# Patient Record
Sex: Male | Born: 1970 | Race: Black or African American | Hispanic: No | Marital: Single | Smoking: Current every day smoker
Health system: Southern US, Community
[De-identification: ages and names within clinical notes are randomized; demographics above are authoritative.]

## PROBLEM LIST (undated history)

## (undated) DIAGNOSIS — I272 Pulmonary hypertension, unspecified: Secondary | ICD-10-CM

## (undated) DIAGNOSIS — E118 Type 2 diabetes mellitus with unspecified complications: Secondary | ICD-10-CM

## (undated) DIAGNOSIS — Z89512 Acquired absence of left leg below knee: Secondary | ICD-10-CM

## (undated) DIAGNOSIS — D649 Anemia, unspecified: Secondary | ICD-10-CM

## (undated) DIAGNOSIS — E119 Type 2 diabetes mellitus without complications: Secondary | ICD-10-CM

## (undated) DIAGNOSIS — E1140 Type 2 diabetes mellitus with diabetic neuropathy, unspecified: Secondary | ICD-10-CM

## (undated) DIAGNOSIS — I10 Essential (primary) hypertension: Secondary | ICD-10-CM

## (undated) DIAGNOSIS — M869 Osteomyelitis, unspecified: Secondary | ICD-10-CM

## (undated) DIAGNOSIS — I5042 Chronic combined systolic (congestive) and diastolic (congestive) heart failure: Secondary | ICD-10-CM

## (undated) DIAGNOSIS — F419 Anxiety disorder, unspecified: Secondary | ICD-10-CM

## (undated) DIAGNOSIS — J189 Pneumonia, unspecified organism: Secondary | ICD-10-CM

## (undated) HISTORY — DX: Chronic combined systolic (congestive) and diastolic (congestive) heart failure: I50.42

## (undated) HISTORY — DX: Pulmonary hypertension, unspecified: I27.20

## (undated) HISTORY — DX: Essential (primary) hypertension: I10

## (undated) HISTORY — DX: Type 2 diabetes mellitus with diabetic neuropathy, unspecified: E11.40

## (undated) HISTORY — PX: SCROTUM EXPLORATION: SHX2389

## (undated) HISTORY — PX: ABSCESS DRAINAGE: SHX1119

---

## 2004-01-03 ENCOUNTER — Emergency Department (HOSPITAL_COMMUNITY): Admission: EM | Admit: 2004-01-03 | Discharge: 2004-01-04 | Payer: Self-pay | Admitting: *Deleted

## 2014-06-19 ENCOUNTER — Inpatient Hospital Stay (HOSPITAL_COMMUNITY)
Admission: EM | Admit: 2014-06-19 | Discharge: 2014-06-21 | DRG: 728 | Disposition: A | Discharge status: Home / Self Care | Payer: Self-pay | Attending: Urology | Admitting: Urology

## 2014-06-19 ENCOUNTER — Emergency Department (INDEPENDENT_AMBULATORY_CARE_PROVIDER_SITE_OTHER)
Admission: EM | Admit: 2014-06-19 | Discharge: 2014-06-19 | Disposition: A | Discharge status: Nursing Home (Includes ICF) | Payer: Self-pay | Source: Home / Self Care | Attending: Emergency Medicine | Admitting: Emergency Medicine

## 2014-06-19 ENCOUNTER — Encounter (HOSPITAL_COMMUNITY): Payer: Self-pay | Admitting: *Deleted

## 2014-06-19 ENCOUNTER — Encounter (HOSPITAL_COMMUNITY): Payer: Self-pay | Admitting: Family Medicine

## 2014-06-19 ENCOUNTER — Emergency Department (HOSPITAL_COMMUNITY): Payer: Self-pay

## 2014-06-19 DIAGNOSIS — L0291 Cutaneous abscess, unspecified: Secondary | ICD-10-CM

## 2014-06-19 DIAGNOSIS — N5089 Other specified disorders of the male genital organs: Secondary | ICD-10-CM

## 2014-06-19 DIAGNOSIS — N508 Other specified disorders of male genital organs: Secondary | ICD-10-CM | POA: Diagnosis present

## 2014-06-19 DIAGNOSIS — F17210 Nicotine dependence, cigarettes, uncomplicated: Secondary | ICD-10-CM | POA: Diagnosis present

## 2014-06-19 DIAGNOSIS — Z794 Long term (current) use of insulin: Secondary | ICD-10-CM

## 2014-06-19 DIAGNOSIS — E119 Type 2 diabetes mellitus without complications: Secondary | ICD-10-CM | POA: Diagnosis present

## 2014-06-19 DIAGNOSIS — R000 Tachycardia, unspecified: Secondary | ICD-10-CM | POA: Diagnosis present

## 2014-06-19 DIAGNOSIS — N492 Inflammatory disorders of scrotum: Principal | ICD-10-CM | POA: Diagnosis present

## 2014-06-19 HISTORY — DX: Type 2 diabetes mellitus without complications: E11.9

## 2014-06-19 LAB — CBC WITH DIFFERENTIAL/PLATELET
BASOS ABS: 0 10*3/uL (ref 0.0–0.1)
Basophils Relative: 0 % (ref 0–1)
EOS ABS: 0 10*3/uL (ref 0.0–0.7)
Eosinophils Relative: 0 % (ref 0–5)
HEMATOCRIT: 43.1 % (ref 39.0–52.0)
Hemoglobin: 15.1 g/dL (ref 13.0–17.0)
LYMPHS ABS: 1.4 10*3/uL (ref 0.7–4.0)
Lymphocytes Relative: 8 % — ABNORMAL LOW (ref 12–46)
MCH: 29.7 pg (ref 26.0–34.0)
MCHC: 35 g/dL (ref 30.0–36.0)
MCV: 84.7 fL (ref 78.0–100.0)
MONO ABS: 1.4 10*3/uL — AB (ref 0.1–1.0)
Monocytes Relative: 8 % (ref 3–12)
NEUTROS ABS: 14.1 10*3/uL — AB (ref 1.7–7.7)
Neutrophils Relative %: 84 % — ABNORMAL HIGH (ref 43–77)
Platelets: 228 10*3/uL (ref 150–400)
RBC: 5.09 MIL/uL (ref 4.22–5.81)
RDW: 12.7 % (ref 11.5–15.5)
WBC: 16.8 10*3/uL — AB (ref 4.0–10.5)

## 2014-06-19 LAB — BASIC METABOLIC PANEL
ANION GAP: 16 — AB (ref 5–15)
BUN: 10 mg/dL (ref 6–23)
CHLORIDE: 91 meq/L — AB (ref 96–112)
CO2: 23 meq/L (ref 19–32)
CREATININE: 0.76 mg/dL (ref 0.50–1.35)
Calcium: 9.6 mg/dL (ref 8.4–10.5)
GFR calc Af Amer: >90 mL/min (ref >90)
Glucose, Bld: 210 mg/dL — ABNORMAL HIGH (ref 70–99)
Potassium: 4.1 mEq/L (ref 3.7–5.3)
SODIUM: 130 meq/L — AB (ref 137–147)

## 2014-06-19 LAB — CBG MONITORING, ED: Glucose-Capillary: 213 mg/dL — ABNORMAL HIGH (ref 70–99)

## 2014-06-19 MED ORDER — SODIUM CHLORIDE 0.9 % IV BOLUS (SEPSIS)
1000.0000 mL | Freq: Once | INTRAVENOUS | Status: AC
  Administered 2014-06-19: 1000 mL via INTRAVENOUS

## 2014-06-19 MED ORDER — OXYCODONE-ACETAMINOPHEN 5-325 MG PO TABS
1.0000 | ORAL_TABLET | Freq: Once | ORAL | Status: AC
  Administered 2014-06-19: 1 via ORAL
  Filled 2014-06-19: qty 1

## 2014-06-19 MED ORDER — LIDOCAINE-EPINEPHRINE 2 %-1:100000 IJ SOLN: 20.0000 mL | Freq: Once | INTRAMUSCULAR | Status: DC

## 2014-06-19 MED ORDER — OXYCODONE-ACETAMINOPHEN 5-325 MG PO TABS
2.0000 | ORAL_TABLET | Freq: Once | ORAL | Status: AC
  Administered 2014-06-19: 2 via ORAL
  Filled 2014-06-19: qty 2

## 2014-06-19 MED ORDER — CLINDAMYCIN PHOSPHATE 600 MG/50ML IV SOLN
600.0000 mg | Freq: Once | INTRAVENOUS | Status: AC
  Administered 2014-06-19: 600 mg via INTRAVENOUS
  Filled 2014-06-19: qty 50

## 2014-06-19 MED ORDER — HEPARIN SOD (PORK) LOCK FLUSH 100 UNIT/ML IV SOLN
500.0000 [IU] | Freq: Once | INTRAVENOUS | Status: DC
  Filled 2014-06-19: qty 5

## 2014-06-19 MED ORDER — HYDROMORPHONE HCL 1 MG/ML IJ SOLN
1.0000 mg | Freq: Once | INTRAMUSCULAR | Status: DC
  Administered 2014-06-20: 1 mg via INTRAVENOUS
  Filled 2014-06-19: qty 1

## 2014-06-19 NOTE — ED Notes (Addendum)
C/o boil R groin onset 4-5 days ago.  States it got longer 1-2 days ago.  C/o pain.  No drainage.  [place] [facility] [facility] [place] and thinks he has had MRSA.  Just got out 2 weeks ago.  Vomited 3-4 times today and diarrhea x 1.

## 2014-06-19 NOTE — ED Notes (Signed)
Bed: [facility]2 Expected date:  Expected time:  Means of arrival:  Comments: 

## 2014-06-19 NOTE — ED Notes (Signed)
CBG = 213  RN informed of result.

## 2014-06-19 NOTE — Discharge Instructions (Signed)
We have determined that your problem requires further evaluation in the emergency department.  We will take care of your transport there.  Once at the emergency department, you will be evaluated by a provider and they will order whatever treatment or tests they deem necessary.  We cannot guarantee that they will do any specific test or do any specific treatment.  ° °

## 2014-06-19 NOTE — ED Notes (Signed)
2 attempts for blood draw with no success

## 2014-06-19 NOTE — ED Notes (Signed)
Pt here for abscess to groin area. Denies drainage.

## 2014-06-19 NOTE — ED Provider Notes (Signed)
CSN: [id number]     Arrival date & time 06/19/14  1724 History   First MD Initiated Contact with Patient 06/19/14 1811     Chief Complaint  Patient presents with  . Abscess     (Consider location/radiation/quality/duration/timing/severity/associated sxs/prior Treatment) HPI  Todd Mccoy is a 43 y.o. male with PMH of insulin dependent diabetes presenting with one-week history of abscess to his right groin. Patient states it has grown in size and erythema and tenderness. He has also had associated fevers MAXIMUM TEMPERATURE 100.9. It's 100.3 in ED. Patient also endorses some nausea and one episode of vomiting. Emesis was food contents, nonbloody. Patient denies abdominal pain patient with history of abscesses but never in this location. He denies any drainage. He states he takes NovoLog 7030 twice daily and his current dose ranges from 120s to 200s.   Past Medical History  Diagnosis Date  . Diabetes mellitus without complication    History reviewed. No pertinent past surgical history. Family History  Problem Relation Age of Onset  . Diabetes Mother   . Diabetes Father    History  Substance Use Topics  . Smoking status: Current Every Day Smoker -- 0.50 packs/day    Types: Cigarettes  . Smokeless tobacco: Not on file  . Alcohol Use: Not on file    Review of Systems  Constitutional: Positive for fever and chills.  Respiratory: Negative for cough and shortness of breath.   Cardiovascular: Negative for chest pain.  Gastrointestinal: Positive for nausea, vomiting and abdominal pain.  Genitourinary: Negative for frequency, discharge, penile swelling, scrotal swelling and testicular pain.  Skin: Negative for rash and wound.      Allergies  Review of patient's allergies indicates no known allergies.  Home Medications   Prior to Admission medications   Medication Sig Start Date End Date Taking? Authorizing Provider  insulin NPH-regular Human (NOVOLIN 70/30) (70-30) 100  UNIT/ML injection Inject 14-22 Units into the skin 2 (two) times daily with a meal. Inject 22 units with breakfast and 14 units with supper   Yes Historical Provider, MD   BP 140/75 mmHg  Pulse 102  Temp(Src) 100.3 F (37.9 C)  Resp 16  Wt 207 lb (93.895 kg)  SpO2 100% Physical Exam  Constitutional: He appears well-developed and well-nourished. No distress.  HENT:  Head: Normocephalic and atraumatic.  Eyes: Conjunctivae and EOM are normal. Right eye exhibits no discharge. Left eye exhibits no discharge.  Cardiovascular: Normal rate, regular rhythm and normal heart sounds.   Pulmonary/Chest: Effort normal and breath sounds normal. No respiratory distress. He has no wheezes.  Abdominal: Soft. Bowel sounds are normal. He exhibits no distension. There is no tenderness.  Genitourinary:  Patient with large abscess to right groin however there is no fluctuants noted. Abscess extends from inguinal triangle into right scrotum. It is exquisitely tender to palpation with induration. No noted crepitus, necrosis, ecchymoses, bullae, or blister.  Neurological: He is alert. He exhibits normal muscle tone. Coordination normal.  Skin: Skin is warm and dry. He is not diaphoretic.  Nursing note and vitals reviewed.   ED Course  Procedures (including critical care time) Labs Review Labs Reviewed  BASIC METABOLIC PANEL - Abnormal; Notable for the following:    Sodium 130 (*)    Chloride 91 (*)    Glucose, Bld 210 (*)    Anion gap 16 (*)    All other components within normal limits  CBC WITH DIFFERENTIAL - Abnormal; Notable for the following:    WBC  16.8 (*)    Neutrophils Relative % 84 (*)    Neutro Abs 14.1 (*)    Lymphocytes Relative 8 (*)    Monocytes Absolute 1.4 (*)    All other components within normal limits  CBG MONITORING, ED - Abnormal; Notable for the following:    Glucose-Capillary 213 (*)    All other components within normal limits    Imaging Review US Scrotum  06/19/2014    CLINICAL DATA:  Right scrotal swelling/pain, fever, leukocytosis  EXAM: SCROTAL ULTRASOUND  DOPPLER ULTRASOUND OF THE TESTICLES  TECHNIQUE: Complete ultrasound examination of the testicles, epididymis, and other scrotal structures was performed. Color and spectral Doppler ultrasound were also utilized to evaluate blood flow to the testicles.  COMPARISON:  None.  FINDINGS: Right testicle  Measurements: 4.0 x 1.9 x 2.5 cm. No mass or microlithiasis visualized.  Left testicle  Measurements: 3.8 x 2.2 x 2.3 cm. No mass or microlithiasis visualized.  Right epididymis:  Normal in size and appearance.  Left epididymis:  Normal in size and appearance.  Hydrocele:  None visualized.  Varicocele:  Present on the left.  Pulsed Doppler interrogation of both testes demonstrates low resistance arterial and venous waveforms bilaterally.  Additional comments: Irregular 3.8 x 1.7 x 2.3 cm complex fluid fluid collection in the posterior/lateral right scrotal wall, without significant hyperemia.  IMPRESSION: Normal sonographic appearance of the bilateral testes.  No evidence of testicular torsion.  3.8 x 1.7 x 2.3 cm complex fluid collection in the posterior/lateral right scrotal wall, without significant hyperemia, but worrisome for scrotal wall abscess.   Electronically Signed   By: Julian Hy M.D.   On: 06/19/2014 20:11   [place] Art/ven Flow Abd Pelv Doppler  06/19/2014   CLINICAL DATA:  Right scrotal swelling/pain, fever, leukocytosis  EXAM: SCROTAL ULTRASOUND  DOPPLER ULTRASOUND OF THE TESTICLES  TECHNIQUE: Complete ultrasound examination of the testicles, epididymis, and other scrotal structures was performed. Color and spectral Doppler ultrasound were also utilized to evaluate blood flow to the testicles.  COMPARISON:  None.  FINDINGS: Right testicle  Measurements: 4.0 x 1.9 x 2.5 cm. No mass or microlithiasis visualized.  Left testicle  Measurements: 3.8 x 2.2 x 2.3 cm. No mass or microlithiasis visualized.  Right  epididymis:  Normal in size and appearance.  Left epididymis:  Normal in size and appearance.  Hydrocele:  None visualized.  Varicocele:  Present on the left.  Pulsed Doppler interrogation of both testes demonstrates low resistance arterial and venous waveforms bilaterally.  Additional comments: Irregular 3.8 x 1.7 x 2.3 cm complex fluid fluid collection in the posterior/lateral right scrotal wall, without significant hyperemia.  IMPRESSION: Normal sonographic appearance of the bilateral testes.  No evidence of testicular torsion.  3.8 x 1.7 x 2.3 cm complex fluid collection in the posterior/lateral right scrotal wall, without significant hyperemia, but worrisome for scrotal wall abscess.   Electronically Signed   By: Julian Hy M.D.   On: 06/19/2014 20:11     EKG Interpretation None      MDM   Final diagnoses:  Testicular swelling, right  Scrotal abscess   Patient with history of insulin-dependent diabetes presenting with 5 days of increasing scrotal abscess. Pt with fevers, MAXIMUM TEMPERATURE 100.9, chills, tachycardia. CBG 213. White count 16.8. Patient given IV fluids as well as IV clindamycin. Ultrasound of scrotum without evidence of torsion. Evidence of scrotal wall abscess measuring 4 x 2 x 2 cm. Consult to urologist. Virl Axe currently at [facility]. Requesting patient be  transferred to [facility] for I&D in ED or possible surgical I&D tomorrow with consideration of admission for IV antibiotics. Patient with ride to [facility]. Patient has been discharged from [facility] and is expected at [facility].   Discussed all results and patient verbalizes understanding and agrees with plan.  This is a shared patient. This patient was discussed with the physician, Dr. Audie Pinto who saw and evaluated the patient and agrees with the plan.     Pura Spice, PA-C 06/19/14 2142  Dot Lanes, MD 06/19/14 [phone]

## 2014-06-19 NOTE — ED Provider Notes (Signed)
   Chief Complaint   Recurrent Skin Infections   History of Present Illness   Todd Mccoy is a 43 year old male diabetic has had a one-week history of an abscess in the right groin. He's had multiple abscesses before in various areas including his groin areas and is back area the abscess has not drained any pus but is very painful rated 9/10 in intensity. He's had a temperature today as high as 100.9, nausea, vomiting, and diarrhea.  Review of Systems   Other than as noted above, the patient denies any of the following symptoms: Systemic:  No fever, chills or sweats. Skin:  No rash or itching.  Todd Mccoy   Past medical history, family history, social history, meds, and allergies were reviewed. He's on Novolin 70/3022 units in the morning and 14 in the evening.  Physical Examination     Vital signs:  BP 134/88 mmHg  Pulse 119  Temp(Src) 99.8 F (37.7 C) (Oral)  Resp 16  SpO2 100% Gen.: Alert and oriented, but appears very uncomfortable secondary to pain. Lungs: Clear to auscultation. Heart: Regular rhythm, no gallop or murmur. Abdomen: Soft, nontender, no organomegaly or mass. Skin:  He has a huge abscess in his groin extending down down into his scrotum. This is very tender to palpation. It is indurated, and not fluctuant.  There was no crepitus, necrosis, ecchymosis, or herrhagic bullae. Skin exam was otherwise normal.  No rash.  Assessment   The encounter diagnosis was Abscess.  This is a huge abscess that begins in the inguinal area but extends on down into the scrotum and it appears to be deep, I think or require surgical drainage and IV antibiotics. Also, I'm concerned about his fever and tachycardia. Also the fact that he is a diabetic.  Plan     The patient was transferred to the ED via private vehicle in stable condition.  Medical Decision Making:  43 year old diabetic male has a one-week history of an abscess in his right groin. He's also had fever to 100.9,  nausea, vomiting, and diarrhea. I examination he has a huge abscess in his groin extending on down into the scrotum. I think this will require surgical drainage, and IV antibiotics.      Harden Mo, MD 06/19/14 [phone]

## 2014-06-19 NOTE — ED Provider Notes (Addendum)
CSN: [id number]     Arrival date & time 06/19/14  1724 History   First MD Initiated Contact with Patient 06/19/14 1811     Chief Complaint  Patient presents with  . Abscess     (Consider location/radiation/quality/duration/timing/severity/associated sxs/prior Treatment) Patient is a 43 y.o. male presenting with abscess. The history is provided by the patient.  Abscess Associated symptoms: fever   Associated symptoms: no headaches and no vomiting   pt with hx iddm, hx abscesses, ?mrsa, c/o right scrotal pain and swelling for the past week. Gradual onset, progressively worse. Pain mod/severe. Worse w palpation. +fever. No chills or sweats. No nvd. Denies polyuria or polydipsia. Denies abd pain. No dysuria. No penile discharge. Denies trauma to area. No drainage.       Past Medical History  Diagnosis Date  . Diabetes mellitus without complication    History reviewed. No pertinent past surgical history. Family History  Problem Relation Age of Onset  . Diabetes Mother   . Diabetes Father    History  Substance Use Topics  . Smoking status: Current Every Day Smoker -- 0.50 packs/day    Types: Cigarettes  . Smokeless tobacco: Not on file  . Alcohol Use: Not on file    Review of Systems  Constitutional: Positive for fever. Negative for chills and diaphoresis.  HENT: Negative for sore throat.   Eyes: Negative for redness.  Respiratory: Negative for shortness of breath.   Cardiovascular: Negative for chest pain.  Gastrointestinal: Negative for vomiting, abdominal pain and diarrhea.  Endocrine: Negative for polydipsia and polyuria.  Genitourinary: Negative for dysuria, hematuria, flank pain, discharge and genital sores.  Musculoskeletal: Negative for back pain and neck pain.  Skin: Negative for rash.  Neurological: Negative for headaches.  Hematological: Does not bruise/bleed easily.  Psychiatric/Behavioral: Negative for confusion.      Allergies  Review of patient's  allergies indicates no known allergies.  Home Medications   Prior to Admission medications   Medication Sig Start Date End Date Taking? Authorizing Provider  insulin NPH-regular Human (NOVOLIN 70/30) (70-30) 100 UNIT/ML injection Inject 14-22 Units into the skin 2 (two) times daily with a meal. Inject 22 units with breakfast and 14 units with supper   Yes Historical Provider, MD   BP 132/83 mmHg  Pulse 119  Temp(Src) 100.6 F (38.1 C) (Oral)  Resp 18  Wt 207 lb (93.895 kg)  SpO2 99% Physical Exam  Constitutional: He is oriented to person, place, and time. He appears well-developed and well-nourished. No distress.  HENT:  Head: Atraumatic.  Eyes: Conjunctivae are normal. No scleral icterus.  Neck: Neck supple. No tracheal deviation present.  Cardiovascular: Normal rate.   Pulmonary/Chest: Effort normal. No accessory muscle usage. No respiratory distress.  Abdominal: Soft. Bowel sounds are normal. He exhibits no distension and no mass. There is no tenderness. There is no rebound and no guarding.  Genitourinary:  Large right scrotal abscess. No crepitus.   Musculoskeletal: Normal range of motion. He exhibits no edema or tenderness.  Neurological: He is alert and oriented to person, place, and time.  Skin: Skin is warm and dry. No rash noted. He is not diaphoretic.  Psychiatric: He has a normal mood and affect.  Nursing note and vitals reviewed.   ED Course  Procedures (including critical care time) Labs Review  Results for orders placed or performed during the hospital encounter of [id number]  Basic metabolic panel  Result Value Ref Range   Sodium 130 (L) 137 - 147 mEq/L  Potassium 4.1 3.7 - 5.3 mEq/L   Chloride 91 (L) 96 - 112 mEq/L   CO2 23 19 - 32 mEq/L   Glucose, Bld 210 (H) 70 - 99 mg/dL   BUN 10 6 - 23 mg/dL   Creatinine, Ser 0.76 0.50 - 1.35 mg/dL   Calcium 9.6 8.4 - 10.5 mg/dL   GFR calc non Af Amer >90 >90 mL/min   GFR calc Af Amer >90 >90 mL/min   Anion gap 16  (H) 5 - 15  CBC with Differential  Result Value Ref Range   WBC 16.8 (H) 4.0 - 10.5 K/uL   RBC 5.09 4.22 - 5.81 MIL/uL   Hemoglobin 15.1 13.0 - 17.0 g/dL   HCT 43.1 39.0 - 52.0 %   MCV 84.7 78.0 - 100.0 fL   MCH 29.7 26.0 - 34.0 pg   MCHC 35.0 30.0 - 36.0 g/dL   RDW 12.7 11.5 - 15.5 %   Platelets 228 150 - 400 K/uL   Neutrophils Relative % 84 (H) 43 - 77 %   Neutro Abs 14.1 (H) 1.7 - 7.7 K/uL   Lymphocytes Relative 8 (L) 12 - 46 %   Lymphs Abs 1.4 0.7 - 4.0 K/uL   Monocytes Relative 8 3 - 12 %   Monocytes Absolute 1.4 (H) 0.1 - 1.0 K/uL   Eosinophils Relative 0 0 - 5 %   Eosinophils Absolute 0.0 0.0 - 0.7 K/uL   Basophils Relative 0 0 - 1 %   Basophils Absolute 0.0 0.0 - 0.1 K/uL  POC CBG, ED  Result Value Ref Range   Glucose-Capillary 213 (H) 70 - 99 mg/dL   US Scrotum  06/19/2014   CLINICAL DATA:  Right scrotal swelling/pain, fever, leukocytosis  EXAM: SCROTAL ULTRASOUND  DOPPLER ULTRASOUND OF THE TESTICLES  TECHNIQUE: Complete ultrasound examination of the testicles, epididymis, and other scrotal structures was performed. Color and spectral Doppler ultrasound were also utilized to evaluate blood flow to the testicles.  COMPARISON:  None.  FINDINGS: Right testicle  Measurements: 4.0 x 1.9 x 2.5 cm. No mass or microlithiasis visualized.  Left testicle  Measurements: 3.8 x 2.2 x 2.3 cm. No mass or microlithiasis visualized.  Right epididymis:  Normal in size and appearance.  Left epididymis:  Normal in size and appearance.  Hydrocele:  None visualized.  Varicocele:  Present on the left.  Pulsed Doppler interrogation of both testes demonstrates low resistance arterial and venous waveforms bilaterally.  Additional comments: Irregular 3.8 x 1.7 x 2.3 cm complex fluid fluid collection in the posterior/lateral right scrotal wall, without significant hyperemia.  IMPRESSION: Normal sonographic appearance of the bilateral testes.  No evidence of testicular torsion.  3.8 x 1.7 x 2.3 cm complex  fluid collection in the posterior/lateral right scrotal wall, without significant hyperemia, but worrisome for scrotal wall abscess.   Electronically Signed   By: Julian Hy M.D.   On: 06/19/2014 20:11   [place] Art/ven Flow Abd Pelv Doppler  06/19/2014   CLINICAL DATA:  Right scrotal swelling/pain, fever, leukocytosis  EXAM: SCROTAL ULTRASOUND  DOPPLER ULTRASOUND OF THE TESTICLES  TECHNIQUE: Complete ultrasound examination of the testicles, epididymis, and other scrotal structures was performed. Color and spectral Doppler ultrasound were also utilized to evaluate blood flow to the testicles.  COMPARISON:  None.  FINDINGS: Right testicle  Measurements: 4.0 x 1.9 x 2.5 cm. No mass or microlithiasis visualized.  Left testicle  Measurements: 3.8 x 2.2 x 2.3 cm. No mass or microlithiasis visualized.  Right epididymis:  Normal in size and appearance.  Left epididymis:  Normal in size and appearance.  Hydrocele:  None visualized.  Varicocele:  Present on the left.  Pulsed Doppler interrogation of both testes demonstrates low resistance arterial and venous waveforms bilaterally.  Additional comments: Irregular 3.8 x 1.7 x 2.3 cm complex fluid fluid collection in the posterior/lateral right scrotal wall, without significant hyperemia.  IMPRESSION: Normal sonographic appearance of the bilateral testes.  No evidence of testicular torsion.  3.8 x 1.7 x 2.3 cm complex fluid collection in the posterior/lateral right scrotal wall, without significant hyperemia, but worrisome for scrotal wall abscess.   Electronically Signed   By: Julian Hy M.D.   On: 06/19/2014 20:11      MDM   Pt transferred from [facility] ED to see urologist.   Pt has received iv clindamycin at [facility].  Urology paged.  Reviewed nursing notes and prior charts for additional history.   Dilaudid 1 mg iv for pain.  As pt has iddm, and at risk polymicrobial infxn, will add iv zosyn and vanc.  Urologist has evaluate and states he plans to  drain and admit for iv abx.        Mirna Mires, MD 06/20/14 [phone]

## 2014-06-20 ENCOUNTER — Encounter (HOSPITAL_COMMUNITY): Payer: Self-pay | Admitting: *Deleted

## 2014-06-20 DIAGNOSIS — N492 Inflammatory disorders of scrotum: Secondary | ICD-10-CM | POA: Diagnosis present

## 2014-06-20 LAB — GLUCOSE, CAPILLARY
GLUCOSE-CAPILLARY: 172 mg/dL — AB (ref 70–99)
GLUCOSE-CAPILLARY: 223 mg/dL — AB (ref 70–99)
Glucose-Capillary: 184 mg/dL — ABNORMAL HIGH (ref 70–99)
Glucose-Capillary: 266 mg/dL — ABNORMAL HIGH (ref 70–99)

## 2014-06-20 LAB — CBC
HCT: 39.8 % (ref 39.0–52.0)
Hemoglobin: 13 g/dL (ref 13.0–17.0)
MCH: 28.1 pg (ref 26.0–34.0)
MCHC: 32.7 g/dL (ref 30.0–36.0)
MCV: 86.1 fL (ref 78.0–100.0)
Platelets: 223 10*3/uL (ref 150–400)
RBC: 4.62 MIL/uL (ref 4.22–5.81)
RDW: 13 % (ref 11.5–15.5)
WBC: 17 10*3/uL — ABNORMAL HIGH (ref 4.0–10.5)

## 2014-06-20 MED ORDER — INFLUENZA VAC SPLIT QUAD 0.5 ML IM SUSY
0.5000 mL | PREFILLED_SYRINGE | INTRAMUSCULAR | Status: AC
  Administered 2014-06-21: 0.5 mL via INTRAMUSCULAR
  Filled 2014-06-20 (×2): qty 0.5

## 2014-06-20 MED ORDER — INSULIN ASPART 100 UNIT/ML ~~LOC~~ SOLN
0.0000 [IU] | Freq: Three times a day (TID) | SUBCUTANEOUS | Status: DC
  Administered 2014-06-20: 3 [IU] via SUBCUTANEOUS
  Administered 2014-06-20: 5 [IU] via SUBCUTANEOUS
  Administered 2014-06-20: 3 [IU] via SUBCUTANEOUS
  Administered 2014-06-21: 5 [IU] via SUBCUTANEOUS

## 2014-06-20 MED ORDER — VANCOMYCIN HCL IN DEXTROSE 1-5 GM/200ML-% IV SOLN: 1000.0000 mg | Freq: Once | INTRAVENOUS | Status: DC

## 2014-06-20 MED ORDER — ONDANSETRON HCL 4 MG/2ML IJ SOLN: 4.0000 mg | INTRAMUSCULAR | Status: DC | PRN

## 2014-06-20 MED ORDER — DIPHENHYDRAMINE HCL 12.5 MG/5ML PO ELIX: 12.5000 mg | ORAL_SOLUTION | Freq: Four times a day (QID) | ORAL | Status: DC | PRN

## 2014-06-20 MED ORDER — LIDOCAINE HCL 1 % IJ SOLN
INTRAMUSCULAR | Status: AC
  Administered 2014-06-20: 20 mL
  Filled 2014-06-20: qty 20

## 2014-06-20 MED ORDER — SENNA 8.6 MG PO TABS
1.0000 | ORAL_TABLET | Freq: Two times a day (BID) | ORAL | Status: DC
  Administered 2014-06-20 (×3): 8.6 mg via ORAL
  Filled 2014-06-20 (×3): qty 1

## 2014-06-20 MED ORDER — SODIUM CHLORIDE 0.9 % IV SOLN
INTRAVENOUS | Status: DC
  Administered 2014-06-20 – 2014-06-21 (×3): via INTRAVENOUS

## 2014-06-20 MED ORDER — LIDOCAINE HCL 1 % IJ SOLN
30.0000 mL | Freq: Once | INTRAMUSCULAR | Status: DC
  Administered 2014-06-20: 20 mL

## 2014-06-20 MED ORDER — PIPERACILLIN-TAZOBACTAM 3.375 G IVPB
3.3750 g | Freq: Once | INTRAVENOUS | Status: AC
  Administered 2014-06-20: 3.375 g via INTRAVENOUS
  Filled 2014-06-20: qty 50

## 2014-06-20 MED ORDER — VANCOMYCIN HCL IN DEXTROSE 1-5 GM/200ML-% IV SOLN
1000.0000 mg | Freq: Three times a day (TID) | INTRAVENOUS | Status: DC
  Administered 2014-06-20 – 2014-06-21 (×4): 1000 mg via INTRAVENOUS
  Filled 2014-06-20 (×7): qty 200

## 2014-06-20 MED ORDER — PNEUMOCOCCAL VAC POLYVALENT 25 MCG/0.5ML IJ INJ
0.5000 mL | INJECTION | INTRAMUSCULAR | Status: AC
  Administered 2014-06-21: 0.5 mL via INTRAMUSCULAR
  Filled 2014-06-20 (×2): qty 0.5

## 2014-06-20 MED ORDER — DIPHENHYDRAMINE HCL 50 MG/ML IJ SOLN: 12.5000 mg | Freq: Four times a day (QID) | INTRAMUSCULAR | Status: DC | PRN

## 2014-06-20 MED ORDER — DOCUSATE SODIUM 100 MG PO CAPS
100.0000 mg | ORAL_CAPSULE | Freq: Two times a day (BID) | ORAL | Status: DC
  Administered 2014-06-20 (×3): 100 mg via ORAL
  Filled 2014-06-20 (×3): qty 1

## 2014-06-20 MED ORDER — HYDROCODONE-ACETAMINOPHEN 5-325 MG PO TABS
1.0000 | ORAL_TABLET | ORAL | Status: DC | PRN
  Administered 2014-06-20: 2 via ORAL
  Administered 2014-06-20: 1 via ORAL
  Administered 2014-06-21: 2 via ORAL
  Filled 2014-06-20 (×2): qty 2
  Filled 2014-06-20: qty 1

## 2014-06-20 MED ORDER — MORPHINE SULFATE 2 MG/ML IJ SOLN: 2.0000 mg | INTRAMUSCULAR | Status: DC | PRN

## 2014-06-20 NOTE — H&P (Signed)
H&P  Chief Complaint: Scrotal Abscess  History of Present Illness: Todd Mccoy is a 43 y.o. male history of diabetes and prior abscesses who presents with scrotal abscess. He was first seen in the [facility] ER review was found to have a low-grade temperature of 100.6 and tachycardia with normal blood pressure.  White blood cell count equals 16.8.  Blood sugar recorded 210.  Ultrasound was performed which illustrated a right sided complex collection consistent with abscess in the right hemiscrotum. He was transferred to [facility] ER for further management by urology.  He reports that he has overall been doing well with the exception of the abscess. He has had some subjective fevers at home. He states that he has had abscesses in the past however not on the scrotum. He has had them drained.  Past Medical History  Diagnosis Date  . Diabetes mellitus without complication     History reviewed. No pertinent past surgical history.  Home Medications:    Medication List    ASK your doctor about these medications        insulin NPH-regular Human (70-30) 100 UNIT/ML injection  Commonly known as:  NOVOLIN 70/30  Inject 14-22 Units into the skin 2 (two) times daily with a meal. Inject 22 units with breakfast and 14 units with supper        Allergies: No Known Allergies  Family History  Problem Relation Age of Onset  . Diabetes Mother   . Diabetes Father     Social History:  reports that he has been smoking Cigarettes.  He has been smoking about 0.50 packs per day. He does not have any smokeless tobacco history on file. He reports that he does not use illicit drugs. His alcohol history is not on file.  ROS: A complete review of systems was performed.  All systems are negative except for pertinent findings as noted.  Physical Exam:  Vital signs in last 24 hours: Temp:  [99.8 F (37.7 C)-100.6 F (38.1 C)] 100.6 F (38.1 C) (12/16 2148) Pulse Rate:  [102-119] 119 (12/16  2328) Resp:  [16-18] 18 (12/16 2328) BP: (122-140)/(72-95) 132/83 mmHg (12/16 2328) SpO2:  [97 %-100 %] 99 % (12/16 2328) Weight:  [93.895 kg (207 lb)] 93.895 kg (207 lb) (12/16 1737) Constitutional:  Alert and oriented, No acute distress Cardiovascular: Regular rate and rhythm, No JVD Respiratory: Normal respiratory effort, Lungs clear bilaterally GI: Abdomen is soft, nontender, nondistended, no abdominal masses GU: Normal-appearing phallus, bilaterally descended testicles, right sided induration of the right hemiscrotum with a fluctuant area along the right lateral scrotum. There appeared to have been some spontaneous drainage from this area. There is no crepitus and the perineum was normal. Lymphatic: No lymphadenopathy Neurologic: Grossly intact, no focal deficits Psychiatric: Normal mood and affect  Laboratory Data:   Recent Labs  06/19/14 1832  WBC 16.8*  HGB 15.1  HCT 43.1  PLT 228     Recent Labs  06/19/14 1832  NA 130*  K 4.1  CL 91*  GLUCOSE 210*  BUN 10  CALCIUM 9.6  CREATININE 0.76     Results for orders placed or performed during the hospital encounter of 06/19/14 (from the past 24 hour(s))  Basic metabolic panel     Status: Abnormal   Collection Time: 06/19/14  6:32 PM  Result Value Ref Range   Sodium 130 (L) 137 - 147 mEq/L   Potassium 4.1 3.7 - 5.3 mEq/L   Chloride 91 (L) 96 - 112 mEq/L  CO2 23 19 - 32 mEq/L   Glucose, Bld 210 (H) 70 - 99 mg/dL   BUN 10 6 - 23 mg/dL   Creatinine, Ser 0.76 0.50 - 1.35 mg/dL   Calcium 9.6 8.4 - 10.5 mg/dL   GFR calc non Af Amer >90 >90 mL/min   GFR calc Af Amer >90 >90 mL/min   Anion gap 16 (H) 5 - 15  CBC with Differential     Status: Abnormal   Collection Time: 06/19/14  6:32 PM  Result Value Ref Range   WBC 16.8 (H) 4.0 - 10.5 K/uL   RBC 5.09 4.22 - 5.81 MIL/uL   Hemoglobin 15.1 13.0 - 17.0 g/dL   HCT 43.1 39.0 - 52.0 %   MCV 84.7 78.0 - 100.0 fL   MCH 29.7 26.0 - 34.0 pg   MCHC 35.0 30.0 - 36.0 g/dL    RDW 12.7 11.5 - 15.5 %   Platelets 228 150 - 400 K/uL   Neutrophils Relative % 84 (H) 43 - 77 %   Neutro Abs 14.1 (H) 1.7 - 7.7 K/uL   Lymphocytes Relative 8 (L) 12 - 46 %   Lymphs Abs 1.4 0.7 - 4.0 K/uL   Monocytes Relative 8 3 - 12 %   Monocytes Absolute 1.4 (H) 0.1 - 1.0 K/uL   Eosinophils Relative 0 0 - 5 %   Eosinophils Absolute 0.0 0.0 - 0.7 K/uL   Basophils Relative 0 0 - 1 %   Basophils Absolute 0.0 0.0 - 0.1 K/uL  POC CBG, ED     Status: Abnormal   Collection Time: 06/19/14  6:34 PM  Result Value Ref Range   Glucose-Capillary 213 (H) 70 - 99 mg/dL   No results found for this or any previous visit (from the past 240 hour(s)).  Renal Function:  Recent Labs  06/19/14 1832  CREATININE 0.76   CrCl cannot be calculated (Unknown ideal weight.).  Radiologic Imaging: US Scrotum  06/19/2014   CLINICAL DATA:  Right scrotal swelling/pain, fever, leukocytosis  EXAM: SCROTAL ULTRASOUND  DOPPLER ULTRASOUND OF THE TESTICLES  TECHNIQUE: Complete ultrasound examination of the testicles, epididymis, and other scrotal structures was performed. Color and spectral Doppler ultrasound were also utilized to evaluate blood flow to the testicles.  COMPARISON:  None.  FINDINGS: Right testicle  Measurements: 4.0 x 1.9 x 2.5 cm. No mass or microlithiasis visualized.  Left testicle  Measurements: 3.8 x 2.2 x 2.3 cm. No mass or microlithiasis visualized.  Right epididymis:  Normal in size and appearance.  Left epididymis:  Normal in size and appearance.  Hydrocele:  None visualized.  Varicocele:  Present on the left.  Pulsed Doppler interrogation of both testes demonstrates low resistance arterial and venous waveforms bilaterally.  Additional comments: Irregular 3.8 x 1.7 x 2.3 cm complex fluid fluid collection in the posterior/lateral right scrotal wall, without significant hyperemia.  IMPRESSION: Normal sonographic appearance of the bilateral testes.  No evidence of testicular torsion.  3.8 x 1.7 x 2.3 cm  complex fluid collection in the posterior/lateral right scrotal wall, without significant hyperemia, but worrisome for scrotal wall abscess.   Electronically Signed   By: Julian Hy M.D.   On: 06/19/2014 20:11   [place] Art/ven Flow Abd Pelv Doppler  06/19/2014   CLINICAL DATA:  Right scrotal swelling/pain, fever, leukocytosis  EXAM: SCROTAL ULTRASOUND  DOPPLER ULTRASOUND OF THE TESTICLES  TECHNIQUE: Complete ultrasound examination of the testicles, epididymis, and other scrotal structures was performed. Color and spectral Doppler ultrasound were  also utilized to evaluate blood flow to the testicles.  COMPARISON:  None.  FINDINGS: Right testicle  Measurements: 4.0 x 1.9 x 2.5 cm. No mass or microlithiasis visualized.  Left testicle  Measurements: 3.8 x 2.2 x 2.3 cm. No mass or microlithiasis visualized.  Right epididymis:  Normal in size and appearance.  Left epididymis:  Normal in size and appearance.  Hydrocele:  None visualized.  Varicocele:  Present on the left.  Pulsed Doppler interrogation of both testes demonstrates low resistance arterial and venous waveforms bilaterally.  Additional comments: Irregular 3.8 x 1.7 x 2.3 cm complex fluid fluid collection in the posterior/lateral right scrotal wall, without significant hyperemia.  IMPRESSION: Normal sonographic appearance of the bilateral testes.  No evidence of testicular torsion.  3.8 x 1.7 x 2.3 cm complex fluid collection in the posterior/lateral right scrotal wall, without significant hyperemia, but worrisome for scrotal wall abscess.   Electronically Signed   By: Julian Hy M.D.   On: 06/19/2014 20:11   Impression/Assessment:  Scrotal abscess was drained in the emergency room. The patient was prepped and draped in usual sterile fashion and 1% lidocaine without epinephrine was used to anesthetize the area around the fluctuance and induration.  A sharp blade was then used to lanced the abscess and extend the incision approximately 2 cm.  Drainage of blood and purulent material was appreciated. Hemostat was used to break up loculations and the wound was then packed with iodoform gauze he tolerated the procedure well.  In summary, this is a 43 year old gentleman with a history of diabetes and prior abscesses who presents with an elevated white blood cell count tachycardia normal blood pressure low-grade temperatures and a right-sided hemiscrotal abscess now status post drainage.   Plan:  We will admit the patient to the urology service and start him on broad-spectrum antibiotics, vancomycin. Cultures were sent which we will follow. He will be given a regular diet, consisting carbs and will be started on sliding scale insulin. We'll trend his white blood cell count.     I have seen and examined the pt. I agree with assessment and treatment plan.

## 2014-06-20 NOTE — Progress Notes (Signed)
Subjective: 43 yo with diabetes and scrotal abscess s/p drainage at the bedside in ER last night.  This morning he is feeling better and has less scrotal pain than he did prior to drainage.  No new complaints.  Objective: Vital signs in last 24 hours: Temp:  [98.8 F (37.1 C)-100.6 F (38.1 C)] 99.2 F (37.3 C) (12/17 0616) Pulse Rate:  [102-119] 109 (12/17 0616) Resp:  [16-18] 16 (12/17 0616) BP: (122-147)/(71-95) 133/71 mmHg (12/17 0616) SpO2:  [97 %-100 %] 99 % (12/17 0616) Weight:  [93.895 kg (207 lb)-98.2 kg (216 lb 7.9 oz)] 98.2 kg (216 lb 7.9 oz) (12/17 0145)  Intake/Output from previous day:   Intake/Output this shift:    Physical Exam:  General: Alert and oriented CV: RRR Lungs: Clear Abdomen: Soft, ND GU: scrotum is indurated but no flunctuant areas, area of incision is packed with iodoform gauze, no spreading erythema or crepitus Ext: NT, No erythema  Lab Results:  Recent Labs  06/19/14 1832 06/20/14 0506  HGB 15.1 13.0  HCT 43.1 39.8   BMET  Recent Labs  06/19/14 1832  NA 130*  K 4.1  CL 91*  CO2 23  GLUCOSE 210*  BUN 10  CREATININE 0.76  CALCIUM 9.6     Studies/Results: US Scrotum  06/19/2014   CLINICAL DATA:  Right scrotal swelling/pain, fever, leukocytosis  EXAM: SCROTAL ULTRASOUND  DOPPLER ULTRASOUND OF THE TESTICLES  TECHNIQUE: Complete ultrasound examination of the testicles, epididymis, and other scrotal structures was performed. Color and spectral Doppler ultrasound were also utilized to evaluate blood flow to the testicles.  COMPARISON:  None.  FINDINGS: Right testicle  Measurements: 4.0 x 1.9 x 2.5 cm. No mass or microlithiasis visualized.  Left testicle  Measurements: 3.8 x 2.2 x 2.3 cm. No mass or microlithiasis visualized.  Right epididymis:  Normal in size and appearance.  Left epididymis:  Normal in size and appearance.  Hydrocele:  None visualized.  Varicocele:  Present on the left.  Pulsed Doppler interrogation of both testes  demonstrates low resistance arterial and venous waveforms bilaterally.  Additional comments: Irregular 3.8 x 1.7 x 2.3 cm complex fluid fluid collection in the posterior/lateral right scrotal wall, without significant hyperemia.  IMPRESSION: Normal sonographic appearance of the bilateral testes.  No evidence of testicular torsion.  3.8 x 1.7 x 2.3 cm complex fluid collection in the posterior/lateral right scrotal wall, without significant hyperemia, but worrisome for scrotal wall abscess.   Electronically Signed   By: Julian Hy M.D.   On: 06/19/2014 20:11   [place] Art/ven Flow Abd Pelv Doppler  06/19/2014   CLINICAL DATA:  Right scrotal swelling/pain, fever, leukocytosis  EXAM: SCROTAL ULTRASOUND  DOPPLER ULTRASOUND OF THE TESTICLES  TECHNIQUE: Complete ultrasound examination of the testicles, epididymis, and other scrotal structures was performed. Color and spectral Doppler ultrasound were also utilized to evaluate blood flow to the testicles.  COMPARISON:  None.  FINDINGS: Right testicle  Measurements: 4.0 x 1.9 x 2.5 cm. No mass or microlithiasis visualized.  Left testicle  Measurements: 3.8 x 2.2 x 2.3 cm. No mass or microlithiasis visualized.  Right epididymis:  Normal in size and appearance.  Left epididymis:  Normal in size and appearance.  Hydrocele:  None visualized.  Varicocele:  Present on the left.  Pulsed Doppler interrogation of both testes demonstrates low resistance arterial and venous waveforms bilaterally.  Additional comments: Irregular 3.8 x 1.7 x 2.3 cm complex fluid fluid collection in the posterior/lateral right scrotal wall, without significant hyperemia.  IMPRESSION: Normal sonographic appearance of the bilateral testes.  No evidence of testicular torsion.  3.8 x 1.7 x 2.3 cm complex fluid collection in the posterior/lateral right scrotal wall, without significant hyperemia, but worrisome for scrotal wall abscess.   Electronically Signed   By: Julian Hy M.D.   On: 06/19/2014  20:11    Assessment/Plan:  43 yo with diabetes and scrotal abscess s/p drainage at the bedside in ER early in morning of 12/17.  Appears to be progressing.  WBC remains elevated but this was just a few hours after drainage.  He has had some low grade fevers to TMax of 100.6 and has tachycardia but blood pressures have been stable and he is otherwise feeling well.   - Urine cultures pending, will continue to follow  - Continue Vancomycin  - BID iodoform dressing changes  - PRN pain  - Ambulate  - Continue IV fluids with consistent carbs diet  - Sliding scale insulin    LOS: 1 day   Baltazar Najjar, Will 06/20/2014, 7:04 AM

## 2014-06-20 NOTE — Progress Notes (Signed)
Pt education and demonstration provided to patient about how to perform dressing changes with idoform. Pt verbalized understanding and will perform next dressing change with assistance. Todd Mccoy

## 2014-06-20 NOTE — Progress Notes (Signed)
CARE MANAGEMENT NOTE 06/20/2014  Patient:  Todd Mccoy, Todd Mccoy   Account Number:  [id number]  Date Initiated:  06/20/2014  Documentation initiated by:  Dessa Phi  Subjective/Objective Assessment:   43 Y/O M admitted w/scrotal abscess.     Action/Plan:   From home.   Anticipated DC Date:  06/22/2014   Anticipated DC Plan:  [place]  CM consult      Choice offered to / List presented to:             Status of service:  In process, will continue to follow Medicare Important Message given?   (If response is "NO", the following Medicare IM given date fields will be blank) Date Medicare IM given:   Medicare IM given by:   Date Additional Medicare IM given:   Additional Medicare IM given by:    Discharge Disposition:    Per UR Regulation:  Reviewed for med. necessity/level of care/duration of stay  If discussed at [place] of Stay Meetings, dates discussed:    Comments:  06/20/14 Dessa Phi RN [name] NCM [phone] s/p i&d. No anticipated d/c needs.

## 2014-06-20 NOTE — Progress Notes (Signed)
ANTIBIOTIC CONSULT NOTE - INITIAL  Pharmacy Consult for Vancomycin Indication: Scrotal abscess  No Known Allergies  Patient Measurements: Weight: 207 lb (93.895 kg)   Vital Signs: Temp: 100.6 F (38.1 C) (12/16 2148) Temp Source: Oral (12/16 2148) BP: 132/83 mmHg (12/16 2328) Pulse Rate: 119 (12/16 2328) Intake/Output from previous day:   Intake/Output from this shift:    Labs:  Recent Labs  06/19/14 1832  WBC 16.8*  HGB 15.1  PLT 228  CREATININE 0.76   CrCl cannot be calculated (Unknown ideal weight.). No results for input(s): VANCOTROUGH, VANCOPEAK, VANCORANDOM, GENTTROUGH, GENTPEAK, GENTRANDOM, TOBRATROUGH, TOBRAPEAK, TOBRARND, AMIKACINPEAK, AMIKACINTROU, AMIKACIN in the last 72 hours.   Microbiology: No results found for this or any previous visit (from the past 720 hour(s)).  Medical History: Past Medical History  Diagnosis Date  . Diabetes mellitus without complication     Medications:  Scheduled:  . docusate sodium  100 mg Oral BID  . insulin aspart  0-15 Units Subcutaneous TID WC  . senna  1 tablet Oral BID   Infusions:  . sodium chloride    . piperacillin-tazobactam (ZOSYN)  IV 3.375 g (06/20/14 0047)  . vancomycin     Assessment: 78 yoM with hx DM and prior abscesses now with scrotal abscess. Vancomycin per Rx. 75 yoM with hx DM and prior abscesses  Goal of Therapy:  Vancomycin trough level 15-20 mcg/ml  Plan:   Vancomycin 1Gm IV q8h  F/u SCr/levels/cultures  Lawana Pai R 06/20/2014,1:16 AM

## 2014-06-21 MED ORDER — HYDROCODONE-ACETAMINOPHEN 5-325 MG PO TABS: 1.0000 | ORAL_TABLET | ORAL | Status: DC | PRN

## 2014-06-21 MED ORDER — DOXYCYCLINE HYCLATE 100 MG PO TABS: 100.0000 mg | ORAL_TABLET | Freq: Two times a day (BID) | ORAL | Status: DC

## 2014-06-21 NOTE — Discharge Summary (Signed)
Patient ID: Todd Mccoy MRN: [id number] DOB/AGE: 1971/03/16 43 y.o.  Admit date: 06/19/2014 Discharge date: 07/09/2014  Primary Care Physician:  No primary care provider on file.  Discharge Diagnoses:   Present on Admission:  . Scrotal abscess  Diabetes mellitus  Consults:  None   Discharge Medications:   Medication List    TAKE these medications        doxycycline 100 MG tablet  Commonly known as:  VIBRA-TABS  Take 1 tablet (100 mg total) by mouth 2 (two) times daily.     HYDROcodone-acetaminophen 5-325 MG per tablet  Commonly known as:  NORCO/VICODIN  Take 1-2 tablets by mouth every 4 (four) hours as needed for moderate pain.     insulin NPH-regular Human (70-30) 100 UNIT/ML injection  Commonly known as:  NOVOLIN 70/30  Inject 14-22 Units into the skin 2 (two) times daily with a meal. Inject 22 units with breakfast and 14 units with supper         Significant Diagnostic Studies:  US Scrotum  06/19/2014   CLINICAL DATA:  Right scrotal swelling/pain, fever, leukocytosis  EXAM: SCROTAL ULTRASOUND  DOPPLER ULTRASOUND OF THE TESTICLES  TECHNIQUE: Complete ultrasound examination of the testicles, epididymis, and other scrotal structures was performed. Color and spectral Doppler ultrasound were also utilized to evaluate blood flow to the testicles.  COMPARISON:  None.  FINDINGS: Right testicle  Measurements: 4.0 x 1.9 x 2.5 cm. No mass or microlithiasis visualized.  Left testicle  Measurements: 3.8 x 2.2 x 2.3 cm. No mass or microlithiasis visualized.  Right epididymis:  Normal in size and appearance.  Left epididymis:  Normal in size and appearance.  Hydrocele:  None visualized.  Varicocele:  Present on the left.  Pulsed Doppler interrogation of both testes demonstrates low resistance arterial and venous waveforms bilaterally.  Additional comments: Irregular 3.8 x 1.7 x 2.3 cm complex fluid fluid collection in the posterior/lateral right scrotal wall, without significant  hyperemia.  IMPRESSION: Normal sonographic appearance of the bilateral testes.  No evidence of testicular torsion.  3.8 x 1.7 x 2.3 cm complex fluid collection in the posterior/lateral right scrotal wall, without significant hyperemia, but worrisome for scrotal wall abscess.   Electronically Signed   By: Julian Hy M.D.   On: 06/19/2014 20:11   [place] Art/ven Flow Abd Pelv Doppler  06/19/2014   CLINICAL DATA:  Right scrotal swelling/pain, fever, leukocytosis  EXAM: SCROTAL ULTRASOUND  DOPPLER ULTRASOUND OF THE TESTICLES  TECHNIQUE: Complete ultrasound examination of the testicles, epididymis, and other scrotal structures was performed. Color and spectral Doppler ultrasound were also utilized to evaluate blood flow to the testicles.  COMPARISON:  None.  FINDINGS: Right testicle  Measurements: 4.0 x 1.9 x 2.5 cm. No mass or microlithiasis visualized.  Left testicle  Measurements: 3.8 x 2.2 x 2.3 cm. No mass or microlithiasis visualized.  Right epididymis:  Normal in size and appearance.  Left epididymis:  Normal in size and appearance.  Hydrocele:  None visualized.  Varicocele:  Present on the left.  Pulsed Doppler interrogation of both testes demonstrates low resistance arterial and venous waveforms bilaterally.  Additional comments: Irregular 3.8 x 1.7 x 2.3 cm complex fluid fluid collection in the posterior/lateral right scrotal wall, without significant hyperemia.  IMPRESSION: Normal sonographic appearance of the bilateral testes.  No evidence of testicular torsion.  3.8 x 1.7 x 2.3 cm complex fluid collection in the posterior/lateral right scrotal wall, without significant hyperemia, but worrisome for scrotal wall abscess.   Electronically Signed  By: Julian Hy M.D.   On: 06/19/2014 20:11    Brief H and P: For complete details please refer to admission H and P, but in brief the patient was admitted following incision and drainage in the emergency room for management of a scrotal  abscess.  Hospital Course:  Active Problems:   Scrotal abscess  The patient tolerated his incision and drainage well. He was sent to the floor for antibiotic management and follow-up. He was discharged home on 06/21/2014 in improved condition Day of Discharge BP 137/78 mmHg  Pulse 101  Temp(Src) 99 F (37.2 C) (Oral)  Resp 16  Ht 5\' 10"  (1.778 m)  Wt 98.2 kg (216 lb 7.9 oz)  BMI 31.06 kg/m2  SpO2 98%  No results found for this or any previous visit (from the past 24 hour(s)).  Physical Exam: General: Alert and awake oriented x3 not in any acute distress. HEENT: anicteric sclera, pupils reactive to light and accommodation CVS: S1-S2 clear no murmur rubs or gallops Chest: clear to auscultation bilaterally, no wheezing rales or rhonchi Abdomen: soft nontender, nondistended, normal bowel sounds, no organomegaly Extremities: no cyanosis, clubbing or edema noted bilaterally Neuro: Cranial nerves II-XII intact, no focal neurological deficits  Disposition:  Home  Diet:  No restrictions  Activity:  Discussed with patient   Disposition and Follow-up:     Discharge Instructions    Discharge patient    Complete by:  As directed             He will follow-up a week or 2 after discharge  TESTS THAT NEED FOLLOW-UP  Culture results  DISCHARGE FOLLOW-UP Follow-up Information    Follow up with [place].   Contact information:   [place] [phone]      Follow up with Jorja Loa, MD.   Specialty:  Urology   Why:  we will call you   Contact information:   [address] [address] [phone]       Time spent on Discharge:  15 minutes  Signed: Jorja Loa 07/09/2014, 12:45 PM

## 2014-06-21 NOTE — Clinical Documentation Improvement (Signed)
Patient admitted with scrotal abscess; I&D performed.   Patient with abnormal lab value. Na+ = 130 upon admission  0.9 NS infusion ordered  Please provide a diagnosis for the abnormal lab value and treatment provided. Please document findings in next progress note and include in discharge summary if applicable.                                Thank You, Zoila Shutter ,RN Clinical Documentation Specialist:  [place] Information Management

## 2014-06-21 NOTE — Progress Notes (Signed)
Inpatient Diabetes Program Recommendations  AACE/ADA: New Consensus Statement on Inpatient Glycemic Control (2013)  Target Ranges:  Prepandial:   less than 140 mg/dL      Peak postprandial:   less than 180 mg/dL (1-2 hours)      Critically ill patients:  140 - 180 mg/dL     Results for DENT, KELNER (MRN [id number]) as of 06/21/2014 09:30  Ref. Range 06/20/2014 07:26 06/20/2014 11:45 06/20/2014 16:53 06/20/2014 22:18  Glucose-Capillary Latest Range: 70-99 mg/dL 172 (H) 223 (H) 184 (H) 266 (H)     Admitted with Scrotal abscess.  History of DM.   Home DM Meds: 70/30 insulin- 22 units with breakfast/ 14 units with supper   Current Insulin Orders: Novolog Moderate SSI tid     MD- Please restart patient's home dose of 70/30 insulin if patient is not discharged home today    Will follow Wyn Quaker RN, MSN, CDE Diabetes Coordinator Inpatient Diabetes Program Team Pager: [phone] (8a-10p)

## 2014-06-21 NOTE — Discharge Instructions (Signed)
Return to the emergency room with worsening of symptoms, new symptoms or with symptoms that are concerning.   Abscess An abscess is an infected area that contains a collection of pus and debris.It can occur in almost any part of the body. An abscess is also known as a furuncle or boil. CAUSES  An abscess occurs when tissue gets infected. This can occur from blockage of oil or sweat glands, infection of hair follicles, or a minor injury to the skin. As the body tries to fight the infection, pus collects in the area and creates pressure under the skin. This pressure causes pain. People with weakened immune systems have difficulty fighting infections and get certain abscesses more often.  SYMPTOMS Usually an abscess develops on the skin and becomes a painful mass that is red, warm, and tender. If the abscess forms under the skin, you may feel a moveable soft area under the skin. Some abscesses break open (rupture) on their own, but most will continue to get worse without care. The infection can spread deeper into the body and eventually into the bloodstream, causing you to feel ill.  DIAGNOSIS  Your caregiver will take your medical history and perform a physical exam. A sample of fluid may also be taken from the abscess to determine what is causing your infection. TREATMENT  Your caregiver may prescribe antibiotic medicines to fight the infection. However, taking antibiotics alone usually does not cure an abscess. Your caregiver may need to make a small cut (incision) in the abscess to drain the pus. In some cases, gauze is packed into the abscess to reduce pain and to continue draining the area. HOME CARE INSTRUCTIONS   Only take over-the-counter or prescription medicines for pain, discomfort, or fever as directed by your caregiver.  If you were prescribed antibiotics, take them as directed. Finish them even if you start to feel better.  If gauze is used, follow your caregiver's directions for  changing the gauze.  To avoid spreading the infection:  Keep your draining abscess covered with a bandage.  Wash your hands well.  Do not share personal care items, towels, or whirlpools with others.  Avoid skin contact with others.  Keep your skin and clothes clean around the abscess.  Keep all follow-up appointments as directed by your caregiver.  It is okay to remove the packing on Sunday if it does not fall out before then [place] IF:   You have increased pain, swelling, redness, fluid drainage, or bleeding.  You have muscle aches, chills, or a general ill feeling.  You have a fever. MAKE SURE YOU:   Understand these instructions.  Will watch your condition.  Will get help right away if you are not doing well or get worse. Document Released: 03/31/2005 Document Revised: 12/21/2011 Document Reviewed: 09/03/2011 [facility] Patient Information 2015 [place], [place]. This information is not intended to replace advice given to you by your health care provider. Make sure you discuss any questions you have with your health care provider.

## 2014-06-22 LAB — WOUND CULTURE

## 2014-06-24 LAB — GLUCOSE, CAPILLARY: Glucose-Capillary: 241 mg/dL — ABNORMAL HIGH (ref 70–99)

## 2014-09-06 ENCOUNTER — Emergency Department (HOSPITAL_COMMUNITY): Payer: Self-pay

## 2014-09-06 ENCOUNTER — Inpatient Hospital Stay (HOSPITAL_COMMUNITY)
Admission: EM | Admit: 2014-09-06 | Discharge: 2014-09-11 | DRG: 853 | Disposition: A | Discharge status: Home Health | Payer: Self-pay | Attending: Internal Medicine | Admitting: Internal Medicine

## 2014-09-06 ENCOUNTER — Encounter (HOSPITAL_COMMUNITY): Payer: Self-pay | Admitting: Emergency Medicine

## 2014-09-06 DIAGNOSIS — Z794 Long term (current) use of insulin: Secondary | ICD-10-CM

## 2014-09-06 DIAGNOSIS — Z599 Problem related to housing and economic circumstances, unspecified: Secondary | ICD-10-CM

## 2014-09-06 DIAGNOSIS — E1165 Type 2 diabetes mellitus with hyperglycemia: Secondary | ICD-10-CM

## 2014-09-06 DIAGNOSIS — Z833 Family history of diabetes mellitus: Secondary | ICD-10-CM

## 2014-09-06 DIAGNOSIS — F17210 Nicotine dependence, cigarettes, uncomplicated: Secondary | ICD-10-CM | POA: Diagnosis present

## 2014-09-06 DIAGNOSIS — M726 Necrotizing fasciitis: Secondary | ICD-10-CM

## 2014-09-06 DIAGNOSIS — E119 Type 2 diabetes mellitus without complications: Secondary | ICD-10-CM

## 2014-09-06 DIAGNOSIS — L02212 Cutaneous abscess of back [any part, except buttock]: Secondary | ICD-10-CM | POA: Diagnosis present

## 2014-09-06 DIAGNOSIS — E11628 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 complications: Secondary | ICD-10-CM

## 2014-09-06 DIAGNOSIS — Z720 Tobacco use: Secondary | ICD-10-CM

## 2014-09-06 DIAGNOSIS — A419 Sepsis, unspecified organism: Principal | ICD-10-CM | POA: Diagnosis present

## 2014-09-06 DIAGNOSIS — Z79891 Long term (current) use of opiate analgesic: Secondary | ICD-10-CM

## 2014-09-06 LAB — CBC WITH DIFFERENTIAL/PLATELET
BASOS ABS: 0 10*3/uL (ref 0.0–0.1)
Basophils Relative: 0 % (ref 0–1)
EOS ABS: 0 10*3/uL (ref 0.0–0.7)
Eosinophils Relative: 0 % (ref 0–5)
HCT: 38.9 % — ABNORMAL LOW (ref 39.0–52.0)
Hemoglobin: 12.8 g/dL — ABNORMAL LOW (ref 13.0–17.0)
LYMPHS ABS: 2 10*3/uL (ref 0.7–4.0)
Lymphocytes Relative: 8 % — ABNORMAL LOW (ref 12–46)
MCH: 28 pg (ref 26.0–34.0)
MCHC: 32.9 g/dL (ref 30.0–36.0)
MCV: 85.1 fL (ref 78.0–100.0)
MONO ABS: 2.2 10*3/uL — AB (ref 0.1–1.0)
Monocytes Relative: 9 % (ref 3–12)
NEUTROS PCT: 83 % — AB (ref 43–77)
Neutro Abs: 20.6 10*3/uL — ABNORMAL HIGH (ref 1.7–7.7)
PLATELETS: 373 10*3/uL (ref 150–400)
RBC: 4.57 MIL/uL (ref 4.22–5.81)
RDW: 13.8 % (ref 11.5–15.5)
WBC: 24.8 10*3/uL — ABNORMAL HIGH (ref 4.0–10.5)

## 2014-09-06 LAB — URINALYSIS, ROUTINE W REFLEX MICROSCOPIC
Hgb urine dipstick: NEGATIVE
Ketones, ur: >80 mg/dL — AB
LEUKOCYTES UA: NEGATIVE
Nitrite: NEGATIVE
Protein, ur: 30 mg/dL — AB
Specific Gravity, Urine: 1.045 — ABNORMAL HIGH (ref 1.005–1.030)
UROBILINOGEN UA: 1 mg/dL (ref 0.0–1.0)
pH: 5.5 (ref 5.0–8.0)

## 2014-09-06 LAB — BASIC METABOLIC PANEL
Anion gap: 14 (ref 5–15)
BUN: 11 mg/dL (ref 6–23)
CHLORIDE: 97 mmol/L (ref 96–112)
CO2: 24 mmol/L (ref 19–32)
Calcium: 9 mg/dL (ref 8.4–10.5)
Creatinine, Ser: 0.85 mg/dL (ref 0.50–1.35)
Glucose, Bld: 314 mg/dL — ABNORMAL HIGH (ref 70–99)
POTASSIUM: 4.2 mmol/L (ref 3.5–5.1)
SODIUM: 135 mmol/L (ref 135–145)

## 2014-09-06 LAB — I-STAT CG4 LACTIC ACID, ED
Lactic Acid, Venous: 1.15 mmol/L (ref 0.5–2.0)
Lactic Acid, Venous: 1.07 mmol/L (ref 0.5–2.0)

## 2014-09-06 LAB — URINE MICROSCOPIC-ADD ON

## 2014-09-06 MED ORDER — SODIUM CHLORIDE 0.9 % IV BOLUS (SEPSIS)
1000.0000 mL | Freq: Once | INTRAVENOUS | Status: AC
  Administered 2014-09-06: 1000 mL via INTRAVENOUS

## 2014-09-06 MED ORDER — OXYCODONE-ACETAMINOPHEN 5-325 MG PO TABS
2.0000 | ORAL_TABLET | Freq: Once | ORAL | Status: AC
  Administered 2014-09-06: 2 via ORAL
  Filled 2014-09-06: qty 2

## 2014-09-06 MED ORDER — LIDOCAINE-EPINEPHRINE 2 %-1:100000 IJ SOLN
20.0000 mL | Freq: Once | INTRAMUSCULAR | Status: AC
  Administered 2014-09-06: 20 mL via INTRADERMAL
  Filled 2014-09-06: qty 1

## 2014-09-06 MED ORDER — MORPHINE SULFATE 4 MG/ML IJ SOLN
4.0000 mg | Freq: Once | INTRAMUSCULAR | Status: AC
  Administered 2014-09-06: 4 mg via INTRAVENOUS
  Filled 2014-09-06: qty 1

## 2014-09-06 MED ORDER — ONDANSETRON 4 MG PO TBDP
4.0000 mg | ORAL_TABLET | Freq: Once | ORAL | Status: AC
  Administered 2014-09-06: 4 mg via ORAL
  Filled 2014-09-06: qty 1

## 2014-09-06 MED ORDER — VANCOMYCIN HCL IN DEXTROSE 1-5 GM/200ML-% IV SOLN
1000.0000 mg | Freq: Once | INTRAVENOUS | Status: AC
  Administered 2014-09-06: 1000 mg via INTRAVENOUS
  Filled 2014-09-06: qty 200

## 2014-09-06 NOTE — ED Provider Notes (Signed)
CSN: [id number]     Arrival date & time 09/06/14  1529 History   First MD Initiated Contact with Patient 09/06/14 1731     Chief Complaint  Patient presents with  . Abscess     (Consider location/radiation/quality/duration/timing/severity/associated sxs/prior Treatment) HPI Todd Mccoy is a 44 y.o. male with history of diabetes who comes in for evaluation of abscess on his back. Patient states for the past 7 days he has had an increasingly painful and growing abscess on his right mid back. He reports that it is draining. He rates his discomfort as 8/10. He has not tried anything to improve his symptoms. He denies any fevers, chills, chest pain, shortness of breath, abdominal pain. He does report associated nausea and vomiting today and has been "unable to keep anything down". No other aggravating or modifying factors.  Past Medical History  Diagnosis Date  . Diabetes mellitus without complication    Past Surgical History  Procedure Laterality Date  . Scrotum exploration     Family History  Problem Relation Age of Onset  . Diabetes Mother   . Diabetes Father   . Diabetes Brother    History  Substance Use Topics  . Smoking status: Current Every Day Smoker -- 0.50 packs/day    Types: Cigarettes  . Smokeless tobacco: Not on file  . Alcohol Use: No    Review of Systems A 10 point review of systems was completed and was negative except for pertinent positives and negatives as mentioned in the history of present illness     Allergies  Review of patient's allergies indicates no known allergies.  Home Medications   Prior to Admission medications   Medication Sig Start Date End Date Taking? Authorizing Provider  insulin NPH-regular Human (NOVOLIN 70/30) (70-30) 100 UNIT/ML injection Inject 14-22 Units into the skin 2 (two) times daily with a meal. Inject 22 units with breakfast and 14 units with supper   Yes Historical Provider, MD  doxycycline (VIBRA-TABS) 100 MG tablet  Take 1 tablet (100 mg total) by mouth 2 (two) times daily. Patient not taking: Reported on 09/06/2014 06/21/14   Jorja Loa, MD  HYDROcodone-acetaminophen (NORCO/VICODIN) 5-325 MG per tablet Take 1-2 tablets by mouth every 4 (four) hours as needed for moderate pain. 06/21/14   Jorja Loa, MD   BP 115/67 mmHg  Pulse 93  Temp(Src) 98.6 F (37 C) (Oral)  Resp 16  Ht 5\' 10"  (1.778 m)  Wt 215 lb 6.2 oz (97.7 kg)  BMI 30.91 kg/m2  SpO2 100% Physical Exam  Constitutional: He is oriented to person, place, and time. He appears well-developed and well-nourished.  HENT:  Head: Normocephalic and atraumatic.  Mouth/Throat: Oropharynx is clear and moist.  Eyes: Conjunctivae are normal. Pupils are equal, round, and reactive to light. Right eye exhibits no discharge. Left eye exhibits no discharge. No scleral icterus.  Neck: Neck supple.  Cardiovascular: Normal rate, regular rhythm and normal heart sounds.   Pulmonary/Chest: Effort normal and breath sounds normal. No respiratory distress. He has no wheezes. He has no rales.  Abdominal: Soft. There is no tenderness.  Musculoskeletal: He exhibits no tenderness.  Neurological: He is alert and oriented to person, place, and time.  Cranial Nerves II-XII grossly intact  Skin: Skin is warm and dry. No rash noted.  Area of erythema and induration over right thoracic mid back. Area is approximately 6" x 2" with multiple areas of drainage.  Psychiatric: He has a normal mood and affect.  Nursing  note and vitals reviewed.   ED Course  Procedures (including critical care time)  ULTRASOUND LIMITED SOFT TISSUE/ MUSCULOSKELETAL: abscess on back Indication: abscess Linear probe used to evaluate area of interest in two planes. Findings:  Abscess  Performed by: Jaquita Folds Images saved electronically  INCISION AND DRAINAGE Performed by: Verl Dicker Consent: Verbal consent obtained. Risks and benefits: risks, benefits and  alternatives were discussed Type: abscess  Body area: Mid back  Anesthesia: local infiltration  Incision was made with a scalpel.  Local anesthetic: lidocaine 2 % with epinephrine  Anesthetic total: 5 ml  Complexity: complex Blunt dissection to break up loculations  Drainage: purulent  Drainage amount: Copious   Packing material: 1/2 in iodoform gauze  Patient tolerance: Patient tolerated the procedure well with no immediate complications.     Labs Review Labs Reviewed  BASIC METABOLIC PANEL - Abnormal; Notable for the following:    Glucose, Bld 314 (*)    All other components within normal limits  CBC WITH DIFFERENTIAL/PLATELET - Abnormal; Notable for the following:    WBC 24.8 (*)    Hemoglobin 12.8 (*)    HCT 38.9 (*)    Neutrophils Relative % 83 (*)    Lymphocytes Relative 8 (*)    Neutro Abs 20.6 (*)    Monocytes Absolute 2.2 (*)    All other components within normal limits  URINALYSIS, ROUTINE W REFLEX MICROSCOPIC - Abnormal; Notable for the following:    Specific Gravity, Urine 1.045 (*)    Glucose, UA >1000 (*)    Bilirubin Urine SMALL (*)    Ketones, ur >80 (*)    Protein, ur 30 (*)    All other components within normal limits  CULTURE, BLOOD (ROUTINE X 2)  CULTURE, BLOOD (ROUTINE X 2)  URINE CULTURE  URINE MICROSCOPIC-ADD ON  MAGNESIUM  PHOSPHORUS  TSH  COMPREHENSIVE METABOLIC PANEL  CBC  CBC  CREATININE, SERUM  HEMOGLOBIN A1C  HIV ANTIBODY (ROUTINE TESTING)  I-STAT CG4 LACTIC ACID, ED  I-STAT CG4 LACTIC ACID, ED  I-STAT CG4 LACTIC ACID, ED    Imaging Review Dg Chest Port 1 View  09/06/2014   CLINICAL DATA:  Abscess about lower right back.  Diabetes.  Smoker.  EXAM: PORTABLE CHEST - 1 VIEW  COMPARISON:  None.  FINDINGS: Midline trachea. Normal heart size. No pleural effusion or pneumothorax. There may be minimal right upper lobe scarring laterally.  IMPRESSION: No acute cardiopulmonary disease.   Electronically Signed   By: Abigail Miyamoto  M.D.   On: 09/06/2014 23:24     EKG Interpretation None     Meds given in ED:  Medications  piperacillin-tazobactam (ZOSYN) IVPB 3.375 g (not administered)  morphine 2 MG/ML injection 2 mg (not administered)  oxyCODONE-acetaminophen (PERCOCET/ROXICET) 5-325 MG per tablet 1 tablet (not administered)  sodium chloride 0.9 % bolus 1,000 mL (not administered)  sodium chloride 0.9 % injection 3 mL (3 mLs Intravenous Given 09/07/14 0032)  acetaminophen (TYLENOL) tablet 650 mg (not administered)    Or  acetaminophen (TYLENOL) suppository 650 mg (not administered)  docusate sodium (COLACE) capsule 100 mg (not administered)  ondansetron (ZOFRAN) tablet 4 mg (not administered)    Or  ondansetron (ZOFRAN) injection 4 mg (not administered)  insulin aspart (novoLOG) injection 0-5 Units (not administered)  enoxaparin (LOVENOX) injection 40 mg (not administered)  0.9 %  sodium chloride infusion ( Intravenous New Bag/Given 09/07/14 0032)  insulin glargine (LANTUS) injection 20 Units (not administered)  insulin aspart (novoLOG) injection 0-15  Units (not administered)  ondansetron (ZOFRAN-ODT) disintegrating tablet 4 mg (4 mg Oral Given 09/06/14 1845)  sodium chloride 0.9 % bolus 1,000 mL (0 mLs Intravenous Stopped 09/06/14 1948)  oxyCODONE-acetaminophen (PERCOCET/ROXICET) 5-325 MG per tablet 2 tablet (2 tablets Oral Given 09/06/14 1948)  lidocaine-EPINEPHrine (XYLOCAINE W/EPI) 2 %-1:100000 (with pres) injection 20 mL (20 mLs Intradermal Given 09/06/14 1948)  vancomycin (VANCOCIN) IVPB 1000 mg/200 mL premix (0 mg Intravenous Stopped 09/06/14 2307)  morphine 4 MG/ML injection 4 mg (4 mg Intravenous Given 09/06/14 2146)  iohexol (OMNIPAQUE) 300 MG/ML solution 50 mL (50 mLs Oral Contrast Given 09/07/14 0023)    Current Discharge Medication List     Filed Vitals:   09/06/14 2159 09/06/14 2200 09/06/14 2230 09/07/14 0026  BP:  133/66 126/63 115/67  Pulse:    93  Temp: 101.2 F (38.4 C)   98.6 F (37 C)  TempSrc:  Rectal   Oral  Resp:   30 16  Height:    5\' 10"  (1.778 m)  Weight:    215 lb 6.2 oz (97.7 kg)  SpO2:    100%    MDM  44 year old male with diabetes comes in for evaluation of abscess for 7 days. Patient is persistently tachycardic despite IV fluids, febrile to 101.2, leukocytosis 24.8 Abscess drained by myself at bedside with copious purulent drainage. Initiated IV vancomycin. Initiated code sepsis. Patient remains alert and oriented, is at baseline and responds appropriately to conversation. Consult to internal medicine for admission of IV antibiotics. Patient admitted Final diagnoses:  Abscess of back  Abscess of back        Viona Gilmore [place], PA-C 09/07/14 [place], MD 09/07/14 [phone]

## 2014-09-06 NOTE — H&P (Signed)
PCP: none    Chief Complaint:   Right lower back pain and swelling  HPI: Todd Mccoy is a 44 y.o. male   has a past medical history of Diabetes mellitus without complication.   Presented with  Patient reports he been having a pain in his right lower back with some swelling noted some drainage of pus and blood. Patient has history of diabetes. He have had prior abscess in the past requiring drainage. Patient presented to emergency department. He was found to meet sepsis criteria with white blood cell count is 24.8 Fever up to 101.2 tachycardia up to 122 respiratory rate of 30. Abscess have been drained producing a large amount of purulent substance. Blood cultures have been obtained lactic acid appears to be within normal limits. Patient was started on vancomycin in emergency department. Of note patient had had recurrent abscesses in the past. His most significant was abscess of the scrotum in December 2015 and Friday and scrotal exploration Patient reports not having a PCP he has been using his insuline based on [facility] today BG 314. He reports poor control at home.  Hospitalist was called for admission for sepsis secondary to lower back abscess in diabetic  Review of Systems:    Pertinent positives include: Fevers, chills  Constitutional:  No weight loss, night sweats, , fatigue, weight loss  HEENT:  No headaches, Difficulty swallowing,Tooth/dental problems,Sore throat,  No sneezing, itching, ear ache, nasal congestion, post nasal drip,  Cardio-vascular:  No chest pain, Orthopnea, PND, anasarca, dizziness, palpitations.no Bilateral lower extremity swelling  GI:  No heartburn, indigestion, abdominal pain, nausea, vomiting, diarrhea, change in bowel habits, loss of appetite, melena, blood in stool, hematemesis Resp:  no shortness of breath at rest. No dyspnea on exertion, No excess mucus, no productive cough, No non-productive  cough, No coughing up of blood.No change in color of mucus.No wheezing. Skin:  no rash or lesions. No jaundice GU:  no dysuria, change in color of urine, no urgency or frequency. No straining to urinate.  No flank pain.  Musculoskeletal:  No joint pain or no joint swelling. No decreased range of motion. No back pain.  Psych:  No change in mood or affect. No depression or anxiety. No memory loss.  Neuro: no localizing neurological complaints, no tingling, no weakness, no double vision, no gait abnormality, no slurred speech, no confusion  Otherwise ROS are negative except for above, 10 systems were reviewed  Past Medical History: Past Medical History  Diagnosis Date  . Diabetes mellitus without complication    Past Surgical History  Procedure Laterality Date  . Scrotum exploration       Medications: Prior to Admission medications   Medication Sig Start Date End Date Taking? Authorizing Provider  insulin NPH-regular Human (NOVOLIN 70/30) (70-30) 100 UNIT/ML injection Inject 14-22 Units into the skin 2 (two) times daily with a meal. Inject 22 units with breakfast and 14 units with supper   Yes Historical Provider, MD  doxycycline (VIBRA-TABS) 100 MG tablet Take 1 tablet (100 mg total) by mouth 2 (two) times daily. Patient not taking: Reported on 09/06/2014 06/21/14   Jorja Loa, MD  HYDROcodone-acetaminophen (NORCO/VICODIN) 5-325 MG per tablet Take 1-2 tablets by mouth every 4 (four) hours as needed for moderate pain. 06/21/14   Jorja Loa, MD    Allergies:  No Known Allergies  Social History:  Ambulatory   Independently  Lives at home With family  reports that he has been smoking Cigarettes.  He has been smoking about 0.50 packs per day. He does not have any smokeless tobacco history on file. He reports that he does not drink alcohol or use illicit drugs.    Family History: family history includes Diabetes in his father and mother.    Physical  Exam: Patient Vitals for the past 24 hrs:  BP Temp Temp src Pulse Resp SpO2 Height Weight  09/06/14 2230 126/63 mmHg - - - (!) 30 - - -  09/06/14 2200 133/66 mmHg - - - - - - -  09/06/14 2159 - 101.2 F (38.4 C) Rectal - - - - -  09/06/14 2142 - 100.8 F (38.2 C) Oral - - - - -  09/06/14 2100 148/82 mmHg - - 118 - 99 % - -  09/06/14 2030 141/91 mmHg - - (!) 122 - 97 % - -  09/06/14 2000 135/67 mmHg - - 117 - 99 % - -  09/06/14 1839 143/95 mmHg - - 117 24 98 % - -  09/06/14 1550 137/85 mmHg - - 114 18 98 % 5\' 10"  (1.778 m) 95.255 kg (210 lb)    1. General:  in No Acute distress 2. Psychological: Alert and Oriented 3. Head/ENT:   Moist   Mucous Membranes                          Head Non traumatic, neck supple                          Normal  Dentition 4. SKIN:   decreased Skin turgor,  Skin clean Dry Large abscess noted on the right of the lower back.  5. Heart: Regular rate and rhythm no Murmur, Rub or gallop 6. Lungs: Clear to auscultation bilaterally, no wheezes or crackles   7. Abdomen: Soft, non-tender, Non distended 8. Lower extremities: no clubbing, cyanosis, or edema 9. Neurologically Grossly intact, moving all 4 extremities equally 10. MSK: Normal range of motion  body mass index is 30.13 kg/(m^2).   Labs on Admission:   Results for orders placed or performed during the hospital encounter of 09/06/14 (from the past 24 hour(s))  Basic metabolic panel     Status: Abnormal   Collection Time: 09/06/14  7:01 PM  Result Value Ref Range   Sodium 135 135 - 145 mmol/L   Potassium 4.2 3.5 - 5.1 mmol/L   Chloride 97 96 - 112 mmol/L   CO2 24 19 - 32 mmol/L   Glucose, Bld 314 (H) 70 - 99 mg/dL   BUN 11 6 - 23 mg/dL   Creatinine, Ser 0.85 0.50 - 1.35 mg/dL   Calcium 9.0 8.4 - 10.5 mg/dL   GFR calc non Af Amer >90 >90 mL/min   GFR calc Af Amer >90 >90 mL/min   Anion gap 14 5 - 15  CBC with Differential     Status: Abnormal   Collection Time: 09/06/14  7:01 PM  Result Value  Ref Range   WBC 24.8 (H) 4.0 - 10.5 K/uL   RBC 4.57 4.22 - 5.81 MIL/uL   Hemoglobin 12.8 (L) 13.0 - 17.0 g/dL   HCT 38.9 (L) 39.0 - 52.0 %   MCV 85.1 78.0 - 100.0 fL   MCH 28.0 26.0 - 34.0 pg   MCHC 32.9 30.0 - 36.0 g/dL   RDW 13.8 11.5 - 15.5 %   Platelets 373  150 - 400 K/uL   Neutrophils Relative % 83 (H) 43 - 77 %   Lymphocytes Relative 8 (L) 12 - 46 %   Monocytes Relative 9 3 - 12 %   Eosinophils Relative 0 0 - 5 %   Basophils Relative 0 0 - 1 %   Neutro Abs 20.6 (H) 1.7 - 7.7 K/uL   Lymphs Abs 2.0 0.7 - 4.0 K/uL   Monocytes Absolute 2.2 (H) 0.1 - 1.0 K/uL   Eosinophils Absolute 0.0 0.0 - 0.7 K/uL   Basophils Absolute 0.0 0.0 - 0.1 K/uL   Smear Review MORPHOLOGY UNREMARKABLE   I-Stat CG4 Lactic Acid, ED     Status: None   Collection Time: 09/06/14  9:31 PM  Result Value Ref Range   Lactic Acid, Venous 1.15 0.5 - 2.0 mmol/L    UA not obtained  No results found for: HGBA1C  Estimated Creatinine Clearance: 131.1 mL/min (by C-G formula based on Cr of 0.85).  BNP (last 3 results) No results for input(s): PROBNP in the last 8760 hours.  Other results:  I have pearsonaly reviewed this: ECG not obtained   Filed Weights   09/06/14 1550  Weight: 95.255 kg (210 lb)     Cultures:    Component Value Date/Time   SDES ABSCESS 06/20/2014 0133   SPECREQUEST NONE PENIS 06/20/2014 0133   CULT  06/20/2014 0133    ABUNDANT GROUP B STREP(S.AGALACTIAE)ISOLATED Note: TESTING AGAINST S. AGALACTIAE NOT ROUTINELY PERFORMED DUE TO PREDICTABILITY OF AMP/PEN/VAN SUSCEPTIBILITY. Performed at [place] 06/22/2014 FINAL 06/20/2014 0133     Radiological Exams on Admission: No results found.  Chart has been reviewed  Assessment/Plan  44 year old gentleman with history of poorly controlled diabetes presents with recurrent abscess this time involving lower back.   Present on Admission:  . Sepsis most likely secondary to abscess. Vital signs this is  currently improving no evidence of hypotension. Given persistent tachycardia will monitor on telemetry. We will continue with sepsis protocol including IV fluids and IV antibiotics broad spectrum vancomycin and Zosyn given history of diabetes which is poorly controlled  . Abscess of lower back - broad-spectrum antibiotics. Order CT scan of the area to try to evaluate how deep this abscess extents.   diabetes mellitus. Will change insulin to Lantus 20 units while hospitalized for stability. Ordered sliding scale   patient needs to establish primary care provider to continue management of his diabetes and outpatient basis  Prophylaxis:  Lovenox   CODE STATUS:  FULL CODE    Other plan as per orders.  I have spent a total of 55 min on this admission  Todd Mccoy 09/06/2014, 11:18 PM  [facility]  Pager [phone]   after 2 AM please page floor coverage PA If 7AM-7PM, please contact the day team taking care of the patient  [email or web address]  Password [email or web address]

## 2014-09-06 NOTE — ED Notes (Signed)
Pt states he first noticed abscess to R lower back about a week ago, has progressively grown in size and has become more painful. Pt states it is draining a little bit of blood and yellow pus.

## 2014-09-06 NOTE — ED Notes (Signed)
Pt called to acute room 21 however no response from lobby.

## 2014-09-07 ENCOUNTER — Inpatient Hospital Stay (HOSPITAL_COMMUNITY): Payer: Self-pay

## 2014-09-07 ENCOUNTER — Encounter (HOSPITAL_COMMUNITY)
Admission: EM | Disposition: A | Discharge status: Home Health | Payer: Self-pay | Source: Home / Self Care | Attending: Internal Medicine

## 2014-09-07 ENCOUNTER — Inpatient Hospital Stay (HOSPITAL_COMMUNITY): Payer: Self-pay | Admitting: Anesthesiology

## 2014-09-07 ENCOUNTER — Encounter (HOSPITAL_COMMUNITY): Payer: Self-pay | Admitting: Anesthesiology

## 2014-09-07 DIAGNOSIS — Z720 Tobacco use: Secondary | ICD-10-CM

## 2014-09-07 DIAGNOSIS — L02212 Cutaneous abscess of back [any part, except buttock]: Secondary | ICD-10-CM | POA: Insufficient documentation

## 2014-09-07 DIAGNOSIS — E1165 Type 2 diabetes mellitus with hyperglycemia: Secondary | ICD-10-CM

## 2014-09-07 HISTORY — PX: INCISION AND DRAINAGE ABSCESS: SHX5864

## 2014-09-07 LAB — PHOSPHORUS: Phosphorus: 2.4 mg/dL (ref 2.3–4.6)

## 2014-09-07 LAB — CBC
HCT: 34.4 % — ABNORMAL LOW (ref 39.0–52.0)
HEMOGLOBIN: 11.2 g/dL — AB (ref 13.0–17.0)
MCH: 27.6 pg (ref 26.0–34.0)
MCHC: 32.6 g/dL (ref 30.0–36.0)
MCV: 84.7 fL (ref 78.0–100.0)
Platelets: 297 10*3/uL (ref 150–400)
RBC: 4.06 MIL/uL — ABNORMAL LOW (ref 4.22–5.81)
RDW: 13.6 % (ref 11.5–15.5)
WBC: 21.7 10*3/uL — ABNORMAL HIGH (ref 4.0–10.5)

## 2014-09-07 LAB — GLUCOSE, CAPILLARY
GLUCOSE-CAPILLARY: 180 mg/dL — AB (ref 70–99)
GLUCOSE-CAPILLARY: 215 mg/dL — AB (ref 70–99)
Glucose-Capillary: 251 mg/dL — ABNORMAL HIGH (ref 70–99)
Glucose-Capillary: 230 mg/dL — ABNORMAL HIGH (ref 70–99)
Glucose-Capillary: 203 mg/dL — ABNORMAL HIGH (ref 70–99)

## 2014-09-07 LAB — COMPREHENSIVE METABOLIC PANEL
ALT: 15 U/L (ref 0–53)
ANION GAP: 11 (ref 5–15)
AST: 13 U/L (ref 0–37)
Albumin: 2.5 g/dL — ABNORMAL LOW (ref 3.5–5.2)
Alkaline Phosphatase: 110 U/L (ref 39–117)
BILIRUBIN TOTAL: 1.1 mg/dL (ref 0.3–1.2)
BUN: 10 mg/dL (ref 6–23)
CO2: 23 mmol/L (ref 19–32)
Calcium: 8.1 mg/dL — ABNORMAL LOW (ref 8.4–10.5)
Chloride: 101 mmol/L (ref 96–112)
Creatinine, Ser: 0.67 mg/dL (ref 0.50–1.35)
GFR calc Af Amer: >90 mL/min (ref >90)
GLUCOSE: 236 mg/dL — AB (ref 70–99)
Potassium: 3.6 mmol/L (ref 3.5–5.1)
Sodium: 135 mmol/L (ref 135–145)
Total Protein: 6.4 g/dL (ref 6.0–8.3)

## 2014-09-07 LAB — SURGICAL PCR SCREEN
MRSA, PCR: POSITIVE — AB
Staphylococcus aureus: POSITIVE — AB

## 2014-09-07 LAB — MAGNESIUM: MAGNESIUM: 1.5 mg/dL (ref 1.5–2.5)

## 2014-09-07 LAB — TSH: TSH: 0.629 u[IU]/mL (ref 0.350–4.500)

## 2014-09-07 SURGERY — INCISION AND DRAINAGE, ABSCESS
Anesthesia: General | Site: Flank | Laterality: Right

## 2014-09-07 MED ORDER — OXYCODONE-ACETAMINOPHEN 5-325 MG PO TABS
2.0000 | ORAL_TABLET | ORAL | Status: DC | PRN
  Administered 2014-09-08 – 2014-09-11 (×12): 2 via ORAL
  Filled 2014-09-07 (×12): qty 2

## 2014-09-07 MED ORDER — MUPIROCIN 2 % EX OINT
1.0000 "application " | TOPICAL_OINTMENT | Freq: Two times a day (BID) | CUTANEOUS | Status: DC
  Administered 2014-09-07 – 2014-09-11 (×9): 1 via NASAL

## 2014-09-07 MED ORDER — SODIUM CHLORIDE 0.9 % IJ SOLN
3.0000 mL | Freq: Two times a day (BID) | INTRAMUSCULAR | Status: DC
  Administered 2014-09-07 – 2014-09-11 (×5): 3 mL via INTRAVENOUS

## 2014-09-07 MED ORDER — ACETAMINOPHEN 325 MG PO TABS: 650.0000 mg | ORAL_TABLET | Freq: Four times a day (QID) | ORAL | Status: DC | PRN

## 2014-09-07 MED ORDER — HYDROMORPHONE HCL 1 MG/ML IJ SOLN
INTRAMUSCULAR | Status: AC
  Filled 2014-09-07: qty 1

## 2014-09-07 MED ORDER — DOCUSATE SODIUM 100 MG PO CAPS
100.0000 mg | ORAL_CAPSULE | Freq: Two times a day (BID) | ORAL | Status: DC
  Administered 2014-09-07 – 2014-09-11 (×7): 100 mg via ORAL
  Filled 2014-09-07 (×10): qty 1

## 2014-09-07 MED ORDER — VANCOMYCIN HCL IN DEXTROSE 1-5 GM/200ML-% IV SOLN
1000.0000 mg | Freq: Three times a day (TID) | INTRAVENOUS | Status: DC
  Administered 2014-09-07 – 2014-09-08 (×5): 1000 mg via INTRAVENOUS
  Filled 2014-09-07 (×7): qty 200

## 2014-09-07 MED ORDER — PROPOFOL 10 MG/ML IV BOLUS
INTRAVENOUS | Status: AC
  Filled 2014-09-07: qty 20

## 2014-09-07 MED ORDER — INSULIN ASPART PROT & ASPART (70-30 MIX) 100 UNIT/ML ~~LOC~~ SUSP
15.0000 [IU] | Freq: Two times a day (BID) | SUBCUTANEOUS | Status: DC
  Administered 2014-09-08 (×2): 15 [IU] via SUBCUTANEOUS
  Filled 2014-09-07: qty 10

## 2014-09-07 MED ORDER — FENTANYL CITRATE 0.05 MG/ML IJ SOLN
INTRAMUSCULAR | Status: AC
  Filled 2014-09-07: qty 5

## 2014-09-07 MED ORDER — INSULIN ASPART 100 UNIT/ML ~~LOC~~ SOLN
0.0000 [IU] | SUBCUTANEOUS | Status: DC
  Administered 2014-09-07: 5 [IU] via SUBCUTANEOUS

## 2014-09-07 MED ORDER — ENOXAPARIN SODIUM 40 MG/0.4ML ~~LOC~~ SOLN
40.0000 mg | SUBCUTANEOUS | Status: DC
  Administered 2014-09-08 – 2014-09-11 (×4): 40 mg via SUBCUTANEOUS
  Filled 2014-09-07 (×5): qty 0.4

## 2014-09-07 MED ORDER — NEOSTIGMINE METHYLSULFATE 10 MG/10ML IV SOLN: INTRAVENOUS | Status: DC | PRN

## 2014-09-07 MED ORDER — CHLORHEXIDINE GLUCONATE CLOTH 2 % EX PADS: 6.0000 | MEDICATED_PAD | Freq: Every day | CUTANEOUS | Status: DC

## 2014-09-07 MED ORDER — HYDROMORPHONE HCL 1 MG/ML IJ SOLN
0.2500 mg | INTRAMUSCULAR | Status: DC | PRN
  Administered 2014-09-07 (×2): 0.5 mg via INTRAVENOUS

## 2014-09-07 MED ORDER — MUPIROCIN 2 % EX OINT
TOPICAL_OINTMENT | CUTANEOUS | Status: AC
  Filled 2014-09-07: qty 22

## 2014-09-07 MED ORDER — SODIUM CHLORIDE 0.9 % IV BOLUS (SEPSIS)
1000.0000 mL | Freq: Once | INTRAVENOUS | Status: AC
  Administered 2014-09-07: 1000 mL via INTRAVENOUS

## 2014-09-07 MED ORDER — MORPHINE SULFATE 2 MG/ML IJ SOLN
2.0000 mg | INTRAMUSCULAR | Status: DC | PRN
  Administered 2014-09-07: 6 mg via INTRAVENOUS
  Administered 2014-09-08 – 2014-09-11 (×3): 2 mg via INTRAVENOUS
  Filled 2014-09-07: qty 3
  Filled 2014-09-07 (×4): qty 1

## 2014-09-07 MED ORDER — IOHEXOL 300 MG/ML  SOLN
100.0000 mL | Freq: Once | INTRAMUSCULAR | Status: AC | PRN
  Administered 2014-09-07: 100 mL via INTRAVENOUS

## 2014-09-07 MED ORDER — ONDANSETRON HCL 4 MG/2ML IJ SOLN: 4.0000 mg | Freq: Four times a day (QID) | INTRAMUSCULAR | Status: DC | PRN

## 2014-09-07 MED ORDER — ONDANSETRON HCL 4 MG/2ML IJ SOLN
INTRAMUSCULAR | Status: DC | PRN
  Administered 2014-09-07: 4 mg via INTRAVENOUS

## 2014-09-07 MED ORDER — PHENYLEPHRINE HCL 10 MG/ML IJ SOLN
INTRAMUSCULAR | Status: DC | PRN
  Administered 2014-09-07: 80 ug via INTRAVENOUS

## 2014-09-07 MED ORDER — 0.9 % SODIUM CHLORIDE (POUR BTL) OPTIME
TOPICAL | Status: DC | PRN
  Administered 2014-09-07: 1000 mL

## 2014-09-07 MED ORDER — INSULIN ASPART 100 UNIT/ML ~~LOC~~ SOLN
0.0000 [IU] | Freq: Every day | SUBCUTANEOUS | Status: DC
  Administered 2014-09-07: 3 [IU] via SUBCUTANEOUS
  Administered 2014-09-08 (×2): 2 [IU] via SUBCUTANEOUS

## 2014-09-07 MED ORDER — ONDANSETRON HCL 4 MG/2ML IJ SOLN
INTRAMUSCULAR | Status: AC
  Filled 2014-09-07: qty 2

## 2014-09-07 MED ORDER — MORPHINE SULFATE 2 MG/ML IJ SOLN
2.0000 mg | INTRAMUSCULAR | Status: DC | PRN
  Administered 2014-09-07: 2 mg via INTRAVENOUS
  Filled 2014-09-07: qty 1

## 2014-09-07 MED ORDER — INSULIN ASPART 100 UNIT/ML ~~LOC~~ SOLN
SUBCUTANEOUS | Status: AC
  Filled 2014-09-07: qty 1

## 2014-09-07 MED ORDER — SODIUM CHLORIDE 0.9 % IV SOLN
INTRAVENOUS | Status: AC
  Administered 2014-09-07: 01:00:00 via INTRAVENOUS

## 2014-09-07 MED ORDER — LIDOCAINE HCL (CARDIAC) 20 MG/ML IV SOLN
INTRAVENOUS | Status: AC
  Filled 2014-09-07: qty 5

## 2014-09-07 MED ORDER — PIPERACILLIN-TAZOBACTAM 3.375 G IVPB
3.3750 g | Freq: Three times a day (TID) | INTRAVENOUS | Status: DC
  Administered 2014-09-07 – 2014-09-09 (×9): 3.375 g via INTRAVENOUS
  Filled 2014-09-07 (×10): qty 50

## 2014-09-07 MED ORDER — PROPOFOL 10 MG/ML IV BOLUS
INTRAVENOUS | Status: DC | PRN
  Administered 2014-09-07: 50 mg via INTRAVENOUS
  Administered 2014-09-07: 200 mg via INTRAVENOUS

## 2014-09-07 MED ORDER — MIDAZOLAM HCL 5 MG/5ML IJ SOLN
INTRAMUSCULAR | Status: DC | PRN
  Administered 2014-09-07: 2 mg via INTRAVENOUS

## 2014-09-07 MED ORDER — ONDANSETRON HCL 4 MG PO TABS: 4.0000 mg | ORAL_TABLET | Freq: Four times a day (QID) | ORAL | Status: DC | PRN

## 2014-09-07 MED ORDER — ACETAMINOPHEN 650 MG RE SUPP: 650.0000 mg | Freq: Four times a day (QID) | RECTAL | Status: DC | PRN

## 2014-09-07 MED ORDER — FENTANYL CITRATE 0.05 MG/ML IJ SOLN
INTRAMUSCULAR | Status: DC | PRN
  Administered 2014-09-07: 50 ug via INTRAVENOUS
  Administered 2014-09-07: 25 ug via INTRAVENOUS
  Administered 2014-09-07 (×2): 50 ug via INTRAVENOUS
  Administered 2014-09-07: 100 ug via INTRAVENOUS
  Administered 2014-09-07: 25 ug via INTRAVENOUS
  Administered 2014-09-07 (×3): 50 ug via INTRAVENOUS

## 2014-09-07 MED ORDER — LACTATED RINGERS IV SOLN
INTRAVENOUS | Status: DC | PRN
  Administered 2014-09-07: 18:00:00 via INTRAVENOUS

## 2014-09-07 MED ORDER — FENTANYL CITRATE 0.05 MG/ML IJ SOLN
INTRAMUSCULAR | Status: AC
  Filled 2014-09-07: qty 2

## 2014-09-07 MED ORDER — OXYCODONE-ACETAMINOPHEN 5-325 MG PO TABS: 1.0000 | ORAL_TABLET | Freq: Four times a day (QID) | ORAL | Status: DC | PRN

## 2014-09-07 MED ORDER — INSULIN ASPART 100 UNIT/ML ~~LOC~~ SOLN
0.0000 [IU] | Freq: Three times a day (TID) | SUBCUTANEOUS | Status: DC
  Administered 2014-09-07: 3 [IU] via SUBCUTANEOUS
  Administered 2014-09-08: 5 [IU] via SUBCUTANEOUS
  Administered 2014-09-08: 11 [IU] via SUBCUTANEOUS
  Administered 2014-09-08: 8 [IU] via SUBCUTANEOUS
  Administered 2014-09-09: 11 [IU] via SUBCUTANEOUS
  Administered 2014-09-09 (×2): 5 [IU] via SUBCUTANEOUS
  Administered 2014-09-10 (×2): 8 [IU] via SUBCUTANEOUS
  Administered 2014-09-10: 2 [IU] via SUBCUTANEOUS
  Administered 2014-09-11: 3 [IU] via SUBCUTANEOUS
  Administered 2014-09-11: 5 [IU] via SUBCUTANEOUS

## 2014-09-07 MED ORDER — MIDAZOLAM HCL 2 MG/2ML IJ SOLN
INTRAMUSCULAR | Status: AC
  Filled 2014-09-07: qty 2

## 2014-09-07 MED ORDER — LACTATED RINGERS IV SOLN: INTRAVENOUS | Status: DC

## 2014-09-07 MED ORDER — IOHEXOL 300 MG/ML  SOLN
50.0000 mL | Freq: Once | INTRAMUSCULAR | Status: AC | PRN
  Administered 2014-09-07: 50 mL via ORAL

## 2014-09-07 MED ORDER — PHENYLEPHRINE 40 MCG/ML (10ML) SYRINGE FOR IV PUSH (FOR BLOOD PRESSURE SUPPORT)
PREFILLED_SYRINGE | INTRAVENOUS | Status: AC
Start: 2014-09-07 — End: 2014-09-07
  Filled 2014-09-07: qty 10

## 2014-09-07 MED ORDER — INSULIN ASPART 100 UNIT/ML ~~LOC~~ SOLN: 0.0000 [IU] | SUBCUTANEOUS | Status: DC

## 2014-09-07 MED ORDER — INSULIN GLARGINE 100 UNIT/ML ~~LOC~~ SOLN
20.0000 [IU] | Freq: Every day | SUBCUTANEOUS | Status: DC
  Administered 2014-09-07: 20 [IU] via SUBCUTANEOUS
  Filled 2014-09-07: qty 0.2

## 2014-09-07 SURGICAL SUPPLY — 27 items
BLADE SURG 15 STRL LF DISP TIS (BLADE) ×1 IMPLANT
BLADE SURG 15 STRL SS (BLADE) ×3
BNDG GAUZE ELAST 4 BULKY (GAUZE/BANDAGES/DRESSINGS) ×3 IMPLANT
COVER SURGICAL LIGHT HANDLE (MISCELLANEOUS) ×3 IMPLANT
DECANTER SPIKE VIAL GLASS SM (MISCELLANEOUS) IMPLANT
DRAPE LAPAROSCOPIC ABDOMINAL (DRAPES) IMPLANT
DRSG PAD ABDOMINAL 8X10 ST (GAUZE/BANDAGES/DRESSINGS) IMPLANT
ELECT REM PT RETURN 9FT ADLT (ELECTROSURGICAL) ×3
ELECTRODE REM PT RTRN 9FT ADLT (ELECTROSURGICAL) ×1 IMPLANT
GAUZE SPONGE 4X4 12PLY STRL (GAUZE/BANDAGES/DRESSINGS) ×3 IMPLANT
GLOVE BIO SURGEON STRL SZ7.5 (GLOVE) ×3 IMPLANT
GOWN STRL REUS W/TWL LRG LVL3 (GOWN DISPOSABLE) ×6 IMPLANT
KIT BASIN OR (CUSTOM PROCEDURE TRAY) ×3 IMPLANT
NEEDLE HYPO 25X1 1.5 SAFETY (NEEDLE) IMPLANT
NS IRRIG 1000ML POUR BTL (IV SOLUTION) ×3 IMPLANT
PENCIL BUTTON HOLSTER BLD 10FT (ELECTRODE) ×3 IMPLANT
SPONGE LAP 18X18 X RAY DECT (DISPOSABLE) ×3 IMPLANT
SUT MNCRL AB 4-0 PS2 18 (SUTURE) IMPLANT
SUT VIC AB 3-0 SH 27 (SUTURE)
SUT VIC AB 3-0 SH 27XBRD (SUTURE) IMPLANT
SWAB COLLECTION DEVICE MRSA (MISCELLANEOUS) IMPLANT
SYR BULB 3OZ (MISCELLANEOUS) ×3 IMPLANT
SYR CONTROL 10ML LL (SYRINGE) IMPLANT
TAPE CLOTH SURG 6X10 WHT LF (GAUZE/BANDAGES/DRESSINGS) ×3 IMPLANT
TOWEL OR 17X26 10 PK STRL BLUE (TOWEL DISPOSABLE) ×3 IMPLANT
TUBE ANAEROBIC SPECIMEN COL (MISCELLANEOUS) IMPLANT
YANKAUER SUCT BULB TIP NO VENT (SUCTIONS) ×3 IMPLANT

## 2014-09-07 NOTE — Progress Notes (Signed)
ANTIBIOTIC CONSULT NOTE - INITIAL  Pharmacy Consult for vancomycin Indication: abscess/sepsis  No Known Allergies  Patient Measurements: Height: 5\' 10"  (177.8 cm) Weight: 197 lb 8 oz (89.585 kg) IBW/kg (Calculated) : 73 Adjusted Body Weight:   Vital Signs: Temp: 98.3 F (36.8 C) (03/05 0504) Temp Source: Oral (03/05 0504) BP: 113/74 mmHg (03/05 0504) Pulse Rate: 99 (03/05 0504) Intake/Output from previous day: 03/04 0701 - 03/05 0700 In: 1903.3 [P.O.:1120; I.V.:683.3; IV Piggyback:100] Out: 900 [Urine:900] Intake/Output from this shift: Total I/O In: 1903.3 [P.O.:1120; I.V.:683.3; IV Piggyback:100] Out: 900 [Urine:900]  Labs:  Recent Labs  09/06/14 1901 09/07/14 0345  WBC 24.8* 21.7*  HGB 12.8* 11.2*  PLT 373 297  CREATININE 0.85 0.67   Estimated Creatinine Clearance: 135.4 mL/min (by C-G formula based on Cr of 0.67). No results for input(s): VANCOTROUGH, VANCOPEAK, VANCORANDOM, GENTTROUGH, GENTPEAK, GENTRANDOM, TOBRATROUGH, TOBRAPEAK, TOBRARND, AMIKACINPEAK, AMIKACINTROU, AMIKACIN in the last 72 hours.   Microbiology: No results found for this or any previous visit (from the past 720 hour(s)).  Medical History: Past Medical History  Diagnosis Date  . Diabetes mellitus without complication     Medications:  Anti-infectives    Start     Dose/Rate Route Frequency Ordered Stop   09/07/14 0645  vancomycin (VANCOCIN) IVPB 1000 mg/200 mL premix     1,000 mg 200 mL/hr over 60 Minutes Intravenous 3 times per day 09/07/14 0631     09/07/14 0030  piperacillin-tazobactam (ZOSYN) IVPB 3.375 g     3.375 g 12.5 mL/hr over 240 Minutes Intravenous 3 times per day 09/07/14 0017     09/06/14 2130  vancomycin (VANCOCIN) IVPB 1000 mg/200 mL premix     1,000 mg 200 mL/hr over 60 Minutes Intravenous  Once 09/06/14 2122 09/06/14 2307     Assessment: Patient with DM and abscesses.  Patient with sepsis.  First dose of antibiotics already given.  Goal of Therapy:   Vancomycin trough level 15-20 mcg/ml  Plan:  Measure antibiotic drug levels at steady state Follow up culture results Vancomycin 1gm iv q8hr  Tyler Deis, Jaydee Conran [place] 09/07/2014,6:39 AM

## 2014-09-07 NOTE — Progress Notes (Addendum)
PROGRESS NOTE  Todd Mccoy [id number] DOB: [birth year] DOA: 09/06/2014 PCP: No primary care provider on file. Brief history 44 year old male with a history of diabetes mellitus presents with one-week history of right flank/back pain. He noted some induration that began 1 week prior to this admission. It got worse over the period of this past week. He has some associated nausea and vomiting on the day prior to admission without any hematemesis or hematochezia. The patient was admitted to the hospital on 06/18/2014 where he had incision and drainage of scrotal abscess. The patient has not been on any recent antibiotics. CT of the abdomen and pelvis in the emergency department revealed a 4 cm air pocket with diffuse skin thickening on his right flank area at the level of the liver. Bedside drainage was performed in the emergency department. The patient was febrile with WBC 24.8. Assessment/Plan: Sepsis -Present at  the time of admission -Secondary to back abscess -Continue empiric antibiotics pending culture data Right back/flank abscess -bedside incision in ED but still significant induration and fluctuance -consulted surgery for possible further debridement--spoke with Dr. Zella Richer -npo for now -09/06/14 CT shows 4 cm area of air pocket in the right side of the back at the level of the liver with diffuse skin thickening -HIV antibody Diabetes mellitus type 2 -Previously taking 70/30 at home -restart 70/30 while in hospital -check A1c Tobacco Abuse -cessation discussed   Family Communication:   Pt at beside Disposition Plan:   Home when medically stable       Procedures/Studies: Ct Abdomen Pelvis W Contrast  09/07/2014   CLINICAL DATA:  Acute onset of right lower back pain and swelling, with drainage of pus and blood. Fever and tachycardia. Tachypnea. Initial encounter.  EXAM: CT ABDOMEN AND PELVIS WITH CONTRAST  TECHNIQUE: Multidetector CT imaging of the abdomen  and pelvis was performed using the standard protocol following bolus administration of intravenous contrast.  CONTRAST:  12mL OMNIPAQUE IOHEXOL 300 MG/ML SOLN, 167mL OMNIPAQUE IOHEXOL 300 MG/ML SOLN  COMPARISON:  None.  FINDINGS: The visualized lung bases are clear.  There is diffuse skin thickening along the upper back, compatible with diffuse cellulitis. Underlying soft tissue inflammation is most prominent on the right, with a 4.0 cm collection of air and increased density along the right side of the back at the level of the liver, likely reflecting packing material within a drained soft tissue abscess cavity. Soft tissue inflammation extends inferiorly along the back to the level of the iliac wings. No additional abscesses are identified. There is no evidence of intra-abdominal involvement.  The liver and spleen are unremarkable in appearance. The gallbladder is within normal limits. The pancreas and adrenal glands are unremarkable.  Mild nonspecific perinephric stranding is noted bilaterally. The kidneys are otherwise unremarkable. There is no evidence of hydronephrosis. No renal or ureteral stones are seen. No perinephric stranding is appreciated.  No free fluid is identified. The small bowel is unremarkable in appearance. The stomach is within normal limits. No acute vascular abnormalities are seen. Minimal calcification seen at the aortic bifurcation.  The appendix is normal in caliber, without evidence for appendicitis. The colon is unremarkable in appearance.  The bladder is mildly distended and grossly unremarkable. The prostate is enlarged, measuring 6.4 cm in transverse dimension. No inguinal lymphadenopathy is seen.  No acute osseous abnormalities are identified.  IMPRESSION: 1. 4.0 cm collection of air and increased density along the right side of the back  at the level of the liver likely reflects packing material within the drained soft tissue abscess cavity. Diffuse skin thickening noted along the  upper back, compatible with diffuse cellulitis. Underlying soft tissue inflammation is more prominent on the right, and extends inferiorly along the back to the level of the iliac wings. No evidence of intra-abdominal involvement. 2. No additional abscesses seen. 3. Enlarged prostate noted.   Electronically Signed   By: Garald Balding M.D.   On: 09/07/2014 01:38   Dg Chest Port 1 View  09/06/2014   CLINICAL DATA:  Abscess about lower right back.  Diabetes.  Smoker.  EXAM: PORTABLE CHEST - 1 VIEW  COMPARISON:  None.  FINDINGS: Midline trachea. Normal heart size. No pleural effusion or pneumothorax. There may be minimal right upper lobe scarring laterally.  IMPRESSION: No acute cardiopulmonary disease.   Electronically Signed   By: Abigail Miyamoto M.D.   On: 09/06/2014 23:24         Subjective: Patient complains of pain on the right back. Denies any fevers, chills, chest pain, shortness breath, nausea, vomiting, diarrhea, abdominal pain.  Objective: Filed Vitals:   09/06/14 2200 09/06/14 2230 09/07/14 0026 09/07/14 0504  BP: 133/66 126/63 115/67 113/74  Pulse:   93 99  Temp:   98.6 F (37 C) 98.3 F (36.8 C)  TempSrc:   Oral Oral  Resp:  30 16 24   Height:   5\' 10"  (1.778 m)   Weight:   97.7 kg (215 lb 6.2 oz) 89.585 kg (197 lb 8 oz)  SpO2:   100% 100%    Intake/Output Summary (Last 24 hours) at 09/07/14 0844 Last data filed at 09/07/14 0600  Gross per 24 hour  Intake 1903.33 ml  Output    900 ml  Net 1003.33 ml   Weight change:  Exam:   General:  Pt is alert, follows commands appropriately, not in acute distress  HEENT: No icterus, No thrush, No neck mass, [place]/AT  Cardiovascular: RRR, S1/S2, no rubs, no gallops  Respiratory: CTA bilaterally, no wheezing, no crackles, no rhonchi  Abdomen: Soft/+BS, non tender, non distended, no guarding  Extremities: No edema, No lymphangitis, No petechiae, No rashes, no synovitis; right flank induration with area of fluctuance measuring  approximately 4-6 cm. No crepitance. No necrosis.  Data Reviewed: Basic Metabolic Panel:  Recent Labs Lab 09/06/14 1901 09/07/14 0345  NA 135 135  K 4.2 3.6  CL 97 101  CO2 24 23  GLUCOSE 314* 236*  BUN 11 10  CREATININE 0.85 0.67  CALCIUM 9.0 8.1*  MG  --  1.5  PHOS  --  2.4   Liver Function Tests:  Recent Labs Lab 09/07/14 0345  AST 13  ALT 15  ALKPHOS 110  BILITOT 1.1  PROT 6.4  ALBUMIN 2.5*   No results for input(s): LIPASE, AMYLASE in the last 168 hours. No results for input(s): AMMONIA in the last 168 hours. CBC:  Recent Labs Lab 09/06/14 1901 09/07/14 0345  WBC 24.8* 21.7*  NEUTROABS 20.6*  --   HGB 12.8* 11.2*  HCT 38.9* 34.4*  MCV 85.1 84.7  PLT 373 297   Cardiac Enzymes: No results for input(s): CKTOTAL, CKMB, CKMBINDEX, TROPONINI in the last 168 hours. BNP: Invalid input(s): POCBNP CBG:  Recent Labs Lab 09/07/14 0054 09/07/14 0743  GLUCAP 251* 180*    No results found for this or any previous visit (from the past 240 hour(s)).   Scheduled Meds: . docusate sodium  100 mg Oral  BID  . enoxaparin (LOVENOX) injection  40 mg Subcutaneous Q24H  . insulin aspart  0-15 Units Subcutaneous TID WC  . insulin aspart  0-5 Units Subcutaneous QHS  . insulin glargine  20 Units Subcutaneous QHS  . piperacillin-tazobactam (ZOSYN)  IV  3.375 g Intravenous 3 times per day  . sodium chloride  3 mL Intravenous Q12H  . vancomycin  1,000 mg Intravenous 3 times per day   Continuous Infusions: . sodium chloride 125 mL/hr at 09/07/14 0032     Piccola Arico, DO  [facility] Pager [phone]  If 7PM-7AM, please contact night-coverage [email or web address] Password [email or web address] 09/07/2014, 8:44 AM   LOS: 1 day

## 2014-09-07 NOTE — Op Note (Signed)
Operative Note  Todd Mccoy male 44 y.o. 09/07/2014  PREOPERATIVE DX:  Right flank subcutaneous abscess  POSTOPERATIVE DX:  As above with necrotizing fasciitis  PROCEDURE:   This and necrotizing fasciitis (7 cm x 5 cm x 3 cm deep, including skin, subcutaneous tissue, and fascia         Surgeon: Odis Hollingshead   Assistants:  none  Anesthesia: General endotracheal anesthesia  Indications:   This is a 44 year old male with poorly controlled diabetes we had multiple subcutaneous abscesses. He has MRSA PCR positive. He has a very complex flank abscess that had a simple incision and drainage done in the emergency department. There is necrotizing skin around this area as well as continued drainage from the abscess site. He is now brought to the operating room for the above procedure.    Procedure Detail:  His right flank was marked with my initials. He is brought to the operating and placed supine on the operating table and general anesthetic was given. He was then placed in the left lateral decubitus position. The right flank area was sterilely prepped and draped. I made a large elliptical incision through the skin and subcutaneous tissue. The length of the incision and the transverse area was about 6 cm. There were multiple purulent pockets of fluid that I drained. I excised the skin and subcutaneous tissue of this elliptical area. There was necrotic fascia that I debrided with electrocautery. I debrided some necrotic subcutaneous tissue and broke up all the loculations containing purulent fluid. The measurements of the abscess cavity are as above. I then inspected the area and did not see any other areas of necrotic fascia or loculated abscess pockets.  Bleeding was controlled with electrocautery. Once hemostasis was adequate, the wound was tightly packed with saline moistened gauze. A bulky dry dressing was applied.  He tolerated the procedure well without any apparent complications  and was taken to the recovery room in satisfactory condition.   Estimated Blood Loss:  100 ml         Specimens: skin, subcutaneous tissue, necrotic fascia.        Complications:  * No complications entered in OR log *         Disposition: PACU - hemodynamically stable.         Condition: stable

## 2014-09-07 NOTE — Transfer of Care (Signed)
Immediate Anesthesia Transfer of Care Note  Patient: Todd Mccoy  Procedure(s) Performed: Procedure(s): INCISION AND DRAINAGE ABSCESS RIGHT FLANK (Right)  Patient Location: PACU  Anesthesia Type:General  Level of Consciousness: awake, alert , oriented and patient cooperative  Airway & Oxygen Therapy: Patient Spontanous Breathing and Patient connected to face mask oxygen  Post-op Assessment: Report given to RN and Post -op Vital signs reviewed and stable  Post vital signs: Reviewed and stable  Last Vitals:  Filed Vitals:   09/07/14 1324  BP: 131/79  Pulse: 94  Temp: 37.1 C  Resp: 22    Complications: No apparent anesthesia complications

## 2014-09-07 NOTE — Anesthesia Procedure Notes (Signed)
Procedure Name: LMA Insertion Date/Time: 09/07/2014 6:41 PM Performed by: Dione Booze Pre-anesthesia Checklist: Patient identified, Emergency Drugs available, Suction available and Patient being monitored Patient Re-evaluated:Patient Re-evaluated prior to inductionOxygen Delivery Method: Circle system utilized Preoxygenation: Pre-oxygenation with 100% oxygen Intubation Type: IV induction LMA: LMA with gastric port inserted LMA Size: 5.0 Number of attempts: 1 Dental Injury: Teeth and Oropharynx as per pre-operative assessment

## 2014-09-07 NOTE — Anesthesia Preprocedure Evaluation (Addendum)
Anesthesia Evaluation  Patient identified by MRN, date of birth, ID band Patient awake    Reviewed: Allergy & Precautions, H&P , NPO status , Patient's Chart, lab work & pertinent test results  Airway Mallampati: II  TM Distance: >3 FB Neck ROM: full    Dental no notable dental hx. (+) Teeth Intact, Dental Advisory Given   Pulmonary Current Smoker,  breath sounds clear to auscultation  Pulmonary exam normal       Cardiovascular Exercise Tolerance: Good negative cardio ROS  Rhythm:regular Rate:Normal     Neuro/Psych negative neurological ROS  negative psych ROS   GI/Hepatic negative GI ROS, Neg liver ROS,   Endo/Other  diabetes, Poorly Controlled, Type 2, Insulin Dependent  Renal/GU negative Renal ROS  negative genitourinary   Musculoskeletal   Abdominal   Peds  Hematology negative hematology ROS (+)   Anesthesia Other Findings   Reproductive/Obstetrics negative OB ROS                            Anesthesia Physical Anesthesia Plan  ASA: III  Anesthesia Plan: General   Post-op Pain Management:    Induction: Intravenous  Airway Management Planned: LMA  Additional Equipment:   Intra-op Plan:   Post-operative Plan:   Informed Consent: I have reviewed the patients History and Physical, chart, labs and discussed the procedure including the risks, benefits and alternatives for the proposed anesthesia with the patient or authorized representative who has indicated his/her understanding and acceptance.   Dental Advisory Given  Plan Discussed with: CRNA and Surgeon  Anesthesia Plan Comments:        Anesthesia Quick Evaluation

## 2014-09-07 NOTE — Anesthesia Postprocedure Evaluation (Signed)
  Anesthesia Post-op Note  Patient: Todd Mccoy  Procedure(s) Performed: Procedure(s) (LRB): INCISION AND DRAINAGE ABSCESS RIGHT FLANK (Right)  Patient Location: PACU  Anesthesia Type: General  Level of Consciousness: awake and alert   Airway and Oxygen Therapy: Patient Spontanous Breathing  Post-op Pain: mild  Post-op Assessment: Post-op Vital signs reviewed, Patient's Cardiovascular Status Stable, Respiratory Function Stable, Patent Airway and No signs of Nausea or vomiting  Last Vitals:  Filed Vitals:   09/07/14 2000  BP: 154/83  Pulse: 105  Temp:   Resp: 26    Post-op Vital Signs: stable   Complications: No apparent anesthesia complications

## 2014-09-07 NOTE — Consult Note (Signed)
Reason for Consult: R Flank Abscess Referring Physician: Dr. Carles Collet  Todd Mccoy is a 44 y.o. male.  HPI: Called by Dr. Carles Collet to evaluate pt for R flank abscess. Pt first noticed pain in area x1wk ago. The area of pain and swelling grew and he came to ED to be evaluated yesterday where I&D was performed. Leading up to admission pt c/o nausea and vomiting without bloody emesis, but has since been controlled with Zofran. He still complains of tenderness around the area of induration. He states there has still been blood tinged discharge. He has extensive cutaneous abscess Hx with multiple I&Ds performed. Most recent was 12/15. Last abx use was 12/15, unk abx, for cutaneous abscess.  Past Medical History  Diagnosis Date  . Diabetes mellitus without complication     Past Surgical History  Procedure Laterality Date  . Scrotum exploration      Allergies: No Known Allergies  Medications:  Prior to Admission:  Prescriptions prior to admission  Medication Sig Dispense Refill Last Dose  . insulin NPH-regular Human (NOVOLIN 70/30) (70-30) 100 UNIT/ML injection Inject 14-22 Units into the skin 2 (two) times daily with a meal. Inject 22 units with breakfast and 14 units with supper   09/05/2014 at Unknown time  . doxycycline (VIBRA-TABS) 100 MG tablet Take 1 tablet (100 mg total) by mouth 2 (two) times daily. (Patient not taking: Reported on 09/06/2014) 15 tablet 0 Completed Course at Unknown time  . HYDROcodone-acetaminophen (NORCO/VICODIN) 5-325 MG per tablet Take 1-2 tablets by mouth every 4 (four) hours as needed for moderate pain. 20 tablet 0  at com    Family History  Problem Relation Age of Onset  . Diabetes Mother   . Diabetes Father   . Diabetes Brother     Social History:  reports that he has been smoking Cigarettes.  He has been smoking about 0.50 packs per day. He does not have any smokeless tobacco history on file. He reports that he does not drink alcohol or use illicit  drugs.  ROS General:  Negative for recent weight loss. Pos fever/chills prior to admisison.  Infectious Diseases: Negative recent abx use. Last abx for cutaneous abscess 12/15.  Cardiac  :  No CP/palpitations.  Pulmonary:  Negative SOB/DOE.  Endocrine:  Blood sugars consistently 200+  Gastrointestinal:  N/V prior to admission well controlled now. No diarrhea.  Genitourinary:  Negative urinary symptoms.   Blood pressure 113/74, pulse 99, temperature 98.3 F (36.8 C), temperature source Oral, resp. rate 24, height $RemoveBe'5\' 10"'mDfCXrCgX$  (1.778 m), weight 197 lb 8 oz (89.585 kg), SpO2 100 %.  BP 113/74 mmHg  Pulse 99  Temp(Src) 98.3 F (36.8 C) (Oral)  Resp 24  Ht $R'5\' 10"'zi$  (1.778 m)  Wt 197 lb 8 oz (89.585 kg)  BMI 28.34 kg/m2  SpO2 100%  PE        General Appearance:  Alert, cooperative, no distress, appears stated age  Head:  Normocephalic, without obvious abnormality, atraumatic  Eyes:   Conjunctiva/corneas clear            Neck: Supple, symmetrical, trachea midline, no tenderness/mass/nodules, no JVD  Back:   12x5cm Inudurated area on R flank/back with approx 2cm incision. 5 cm area of skin necrosis, purulent fluid draining from wound  Lungs:   Clear to auscultation bilaterally, respirations unlabored  Chest Wall:  No tenderness or deformity  Heart:  RRR  Abdomen:   Soft, non-tender, bowel sounds active all four quadrants,  no masses, no  organomegaly, no scars        Extremities: Extremities normal, atraumatic, no cyanosis or edema     Skin: Dry, no rashes. Multiple scars from previous I&D procedures.           Results for orders placed or performed during the hospital encounter of 09/06/14 (from the past 48 hour(s))  Basic metabolic panel     Status: Abnormal   Collection Time: 09/06/14  7:01 PM  Result Value Ref Range   Sodium 135 135 - 145 mmol/L   Potassium 4.2 3.5 - 5.1 mmol/L   Chloride 97 96 - 112 mmol/L   CO2 24 19 - 32 mmol/L   Glucose, Bld 314 (H) 70 - 99  mg/dL   BUN 11 6 - 23 mg/dL   Creatinine, Ser 0.85 0.50 - 1.35 mg/dL   Calcium 9.0 8.4 - 10.5 mg/dL   GFR calc non Af Amer >90 >90 mL/min   GFR calc Af Amer >90 >90 mL/min    Comment: (NOTE) The eGFR has been calculated using the CKD EPI equation. This calculation has not been validated in all clinical situations. eGFR's persistently <90 mL/min signify possible Chronic Kidney Disease.    Anion gap 14 5 - 15  CBC with Differential     Status: Abnormal   Collection Time: 09/06/14  7:01 PM  Result Value Ref Range   WBC 24.8 (H) 4.0 - 10.5 K/uL   RBC 4.57 4.22 - 5.81 MIL/uL   Hemoglobin 12.8 (L) 13.0 - 17.0 g/dL   HCT 38.9 (L) 39.0 - 52.0 %   MCV 85.1 78.0 - 100.0 fL   MCH 28.0 26.0 - 34.0 pg   MCHC 32.9 30.0 - 36.0 g/dL   RDW 13.8 11.5 - 15.5 %   Platelets 373 150 - 400 K/uL   Neutrophils Relative % 83 (H) 43 - 77 %   Lymphocytes Relative 8 (L) 12 - 46 %   Monocytes Relative 9 3 - 12 %   Eosinophils Relative 0 0 - 5 %   Basophils Relative 0 0 - 1 %   Neutro Abs 20.6 (H) 1.7 - 7.7 K/uL   Lymphs Abs 2.0 0.7 - 4.0 K/uL   Monocytes Absolute 2.2 (H) 0.1 - 1.0 K/uL   Eosinophils Absolute 0.0 0.0 - 0.7 K/uL   Basophils Absolute 0.0 0.0 - 0.1 K/uL   Smear Review MORPHOLOGY UNREMARKABLE   I-Stat CG4 Lactic Acid, ED     Status: None   Collection Time: 09/06/14  9:31 PM  Result Value Ref Range   Lactic Acid, Venous 1.15 0.5 - 2.0 mmol/L  Urinalysis, Routine w reflex microscopic     Status: Abnormal   Collection Time: 09/06/14 11:07 PM  Result Value Ref Range   Color, Urine YELLOW YELLOW   APPearance CLEAR CLEAR   Specific Gravity, Urine 1.045 (H) 1.005 - 1.030   pH 5.5 5.0 - 8.0   Glucose, UA >1000 (A) NEGATIVE mg/dL   Hgb urine dipstick NEGATIVE NEGATIVE   Bilirubin Urine SMALL (A) NEGATIVE   Ketones, ur >80 (A) NEGATIVE mg/dL   Protein, ur 30 (A) NEGATIVE mg/dL   Urobilinogen, UA 1.0 0.0 - 1.0 mg/dL   Nitrite NEGATIVE NEGATIVE   Leukocytes, UA NEGATIVE NEGATIVE  Urine  microscopic-add on     Status: None   Collection Time: 09/06/14 11:07 PM  Result Value Ref Range   WBC, UA 0-2 <3 WBC/hpf  I-Stat CG4 Lactic Acid, ED (not at [facility])  Status: None   Collection Time: 09/06/14 11:31 PM  Result Value Ref Range   Lactic Acid, Venous 1.07 0.5 - 2.0 mmol/L  Glucose, capillary     Status: Abnormal   Collection Time: 09/07/14 12:54 AM  Result Value Ref Range   Glucose-Capillary 251 (H) 70 - 99 mg/dL  Magnesium     Status: None   Collection Time: 09/07/14  3:45 AM  Result Value Ref Range   Magnesium 1.5 1.5 - 2.5 mg/dL  Phosphorus     Status: None   Collection Time: 09/07/14  3:45 AM  Result Value Ref Range   Phosphorus 2.4 2.3 - 4.6 mg/dL  TSH     Status: None   Collection Time: 09/07/14  3:45 AM  Result Value Ref Range   TSH 0.629 0.350 - 4.500 uIU/mL  Comprehensive metabolic panel     Status: Abnormal   Collection Time: 09/07/14  3:45 AM  Result Value Ref Range   Sodium 135 135 - 145 mmol/L   Potassium 3.6 3.5 - 5.1 mmol/L   Chloride 101 96 - 112 mmol/L   CO2 23 19 - 32 mmol/L   Glucose, Bld 236 (H) 70 - 99 mg/dL   BUN 10 6 - 23 mg/dL   Creatinine, Ser 0.67 0.50 - 1.35 mg/dL   Calcium 8.1 (L) 8.4 - 10.5 mg/dL   Total Protein 6.4 6.0 - 8.3 g/dL   Albumin 2.5 (L) 3.5 - 5.2 g/dL   AST 13 0 - 37 U/L   ALT 15 0 - 53 U/L   Alkaline Phosphatase 110 39 - 117 U/L   Total Bilirubin 1.1 0.3 - 1.2 mg/dL   GFR calc non Af Amer >90 >90 mL/min   GFR calc Af Amer >90 >90 mL/min    Comment: (NOTE) The eGFR has been calculated using the CKD EPI equation. This calculation has not been validated in all clinical situations. eGFR's persistently <90 mL/min signify possible Chronic Kidney Disease.    Anion gap 11 5 - 15  CBC     Status: Abnormal   Collection Time: 09/07/14  3:45 AM  Result Value Ref Range   WBC 21.7 (H) 4.0 - 10.5 K/uL   RBC 4.06 (L) 4.22 - 5.81 MIL/uL   Hemoglobin 11.2 (L) 13.0 - 17.0 g/dL   HCT 34.4 (L) 39.0 - 52.0 %   MCV 84.7 78.0 -  100.0 fL   MCH 27.6 26.0 - 34.0 pg   MCHC 32.6 30.0 - 36.0 g/dL   RDW 13.6 11.5 - 15.5 %   Platelets 297 150 - 400 K/uL  Glucose, capillary     Status: Abnormal   Collection Time: 09/07/14  7:43 AM  Result Value Ref Range   Glucose-Capillary 180 (H) 70 - 99 mg/dL    Ct Abdomen Pelvis W Contrast  09/07/2014   CLINICAL DATA:  Acute onset of right lower back pain and swelling, with drainage of pus and blood. Fever and tachycardia. Tachypnea. Initial encounter.  EXAM: CT ABDOMEN AND PELVIS WITH CONTRAST  TECHNIQUE: Multidetector CT imaging of the abdomen and pelvis was performed using the standard protocol following bolus administration of intravenous contrast.  CONTRAST:  63mL OMNIPAQUE IOHEXOL 300 MG/ML SOLN, 152mL OMNIPAQUE IOHEXOL 300 MG/ML SOLN  COMPARISON:  None.  FINDINGS: The visualized lung bases are clear.  There is diffuse skin thickening along the upper back, compatible with diffuse cellulitis. Underlying soft tissue inflammation is most prominent on the right, with a 4.0 cm collection of air and  increased density along the right side of the back at the level of the liver, likely reflecting packing material within a drained soft tissue abscess cavity. Soft tissue inflammation extends inferiorly along the back to the level of the iliac wings. No additional abscesses are identified. There is no evidence of intra-abdominal involvement.  The liver and spleen are unremarkable in appearance. The gallbladder is within normal limits. The pancreas and adrenal glands are unremarkable.  Mild nonspecific perinephric stranding is noted bilaterally. The kidneys are otherwise unremarkable. There is no evidence of hydronephrosis. No renal or ureteral stones are seen. No perinephric stranding is appreciated.  No free fluid is identified. The small bowel is unremarkable in appearance. The stomach is within normal limits. No acute vascular abnormalities are seen. Minimal calcification seen at the aortic bifurcation.   The appendix is normal in caliber, without evidence for appendicitis. The colon is unremarkable in appearance.  The bladder is mildly distended and grossly unremarkable. The prostate is enlarged, measuring 6.4 cm in transverse dimension. No inguinal lymphadenopathy is seen.  No acute osseous abnormalities are identified.  IMPRESSION: 1. 4.0 cm collection of air and increased density along the right side of the back at the level of the liver likely reflects packing material within the drained soft tissue abscess cavity. Diffuse skin thickening noted along the upper back, compatible with diffuse cellulitis. Underlying soft tissue inflammation is more prominent on the right, and extends inferiorly along the back to the level of the iliac wings. No evidence of intra-abdominal involvement. 2. No additional abscesses seen. 3. Enlarged prostate noted.   Electronically Signed   By: Garald Balding M.D.   On: 09/07/2014 01:38   Dg Chest Port 1 View  09/06/2014   CLINICAL DATA:  Abscess about lower right back.  Diabetes.  Smoker.  EXAM: PORTABLE CHEST - 1 VIEW  COMPARISON:  None.  FINDINGS: Midline trachea. Normal heart size. No pleural effusion or pneumothorax. There may be minimal right upper lobe scarring laterally.  IMPRESSION: No acute cardiopulmonary disease.   Electronically Signed   By: Abigail Miyamoto M.D.   On: 09/06/2014 23:24    Assessment/Plan: Incompletely drained large right back/flank abscess -On appropriate IV abxs Sepsis -Continue antibiotics pending culture  Plan:  To OR this afternoon for Incision, drainage, and debridement of right flank abscess.  Procedure and risks (including but not limited to bleeding, poor wound healing, recurrent infection, need to other operations, anesthesia) were discussed with him.  Importance of good blood sugar control postop explained to him.  He seems to understand and agrees with the plan.   Gwenith Spitz 09/07/2014, 9:52 AM

## 2014-09-08 DIAGNOSIS — M726 Necrotizing fasciitis: Secondary | ICD-10-CM

## 2014-09-08 LAB — BASIC METABOLIC PANEL
Anion gap: 9 (ref 5–15)
BUN: 9 mg/dL (ref 6–23)
CALCIUM: 8.2 mg/dL — AB (ref 8.4–10.5)
CHLORIDE: 99 mmol/L (ref 96–112)
CO2: 27 mmol/L (ref 19–32)
CREATININE: 0.83 mg/dL (ref 0.50–1.35)
Glucose, Bld: 267 mg/dL — ABNORMAL HIGH (ref 70–99)
Potassium: 3.5 mmol/L (ref 3.5–5.1)
Sodium: 135 mmol/L (ref 135–145)

## 2014-09-08 LAB — GLUCOSE, CAPILLARY
GLUCOSE-CAPILLARY: 290 mg/dL — AB (ref 70–99)
GLUCOSE-CAPILLARY: 230 mg/dL — AB (ref 70–99)
Glucose-Capillary: 245 mg/dL — ABNORMAL HIGH (ref 70–99)
Glucose-Capillary: 265 mg/dL — ABNORMAL HIGH (ref 70–99)
Glucose-Capillary: 292 mg/dL — ABNORMAL HIGH (ref 70–99)
Glucose-Capillary: 301 mg/dL — ABNORMAL HIGH (ref 70–99)

## 2014-09-08 LAB — URINE CULTURE

## 2014-09-08 LAB — CBC
HCT: 34.3 % — ABNORMAL LOW (ref 39.0–52.0)
HEMOGLOBIN: 11.1 g/dL — AB (ref 13.0–17.0)
MCH: 27.4 pg (ref 26.0–34.0)
MCHC: 32.4 g/dL (ref 30.0–36.0)
MCV: 84.7 fL (ref 78.0–100.0)
Platelets: 382 10*3/uL (ref 150–400)
RBC: 4.05 MIL/uL — AB (ref 4.22–5.81)
RDW: 13.8 % (ref 11.5–15.5)
WBC: 14.2 10*3/uL — ABNORMAL HIGH (ref 4.0–10.5)

## 2014-09-08 LAB — HIV ANTIBODY (ROUTINE TESTING W REFLEX): HIV Screen 4th Generation wRfx: NONREACTIVE

## 2014-09-08 MED ORDER — INSULIN ASPART PROT & ASPART (70-30 MIX) 100 UNIT/ML ~~LOC~~ SUSP
25.0000 [IU] | Freq: Two times a day (BID) | SUBCUTANEOUS | Status: DC
  Administered 2014-09-09: 25 [IU] via SUBCUTANEOUS

## 2014-09-08 NOTE — Progress Notes (Signed)
PROGRESS NOTE  Todd Mccoy [id number] DOB: 07-09-1970 DOA: 09/06/2014 PCP: No primary care provider on file.  Brief history 44 year old male with a history of diabetes mellitus presents with one-week history of right flank/back pain. He noted some induration that began 1 week prior to this admission. It got worse over the period of this past week. He has some associated nausea and vomiting on the day prior to admission without any hematemesis or hematochezia. The patient was admitted to the hospital on 06/18/2014 where he had incision and drainage of scrotal abscess. The patient has not been on any recent antibiotics. CT of the abdomen and pelvis in the emergency department revealed a 4 cm air pocket with diffuse skin thickening on his right flank area at the level of the liver. Bedside drainage was performed in the emergency department. The patient was febrile with WBC 24.8. Assessment/Plan: Sepsis -Present at the time of admission -Secondary to back abscess -Continue empiric antibiotics pending culture data Right back/flank abscess/necrotizing fasciitis -bedside incision in ED but still significant induration and fluctuance -consulted surgery for possible further debridement--spoke with Dr. Zella Richer -09/07/14 debridement-->necrotizing fascia noted intraop -09/06/14 CT shows 4 cm area of air pocket in the right side of the back at the level of the liver with diffuse skin thickening -HIV antibody--neg -continue abx pending surgical cultures Diabetes mellitus type 2 -Previously taking 70/30 at home-only using sporadically due to financial issues -restart 70/30 while in hospital--increase to 25 units bid -check A1c-pending Tobacco Abuse -cessation discussed   Procedures/Studies: Ct Abdomen Pelvis W Contrast  09/07/2014   CLINICAL DATA:  Acute onset of right lower back pain and swelling, with drainage of pus and blood. Fever and tachycardia. Tachypnea. Initial encounter.   EXAM: CT ABDOMEN AND PELVIS WITH CONTRAST  TECHNIQUE: Multidetector CT imaging of the abdomen and pelvis was performed using the standard protocol following bolus administration of intravenous contrast.  CONTRAST:  82mL OMNIPAQUE IOHEXOL 300 MG/ML SOLN, 16mL OMNIPAQUE IOHEXOL 300 MG/ML SOLN  COMPARISON:  None.  FINDINGS: The visualized lung bases are clear.  There is diffuse skin thickening along the upper back, compatible with diffuse cellulitis. Underlying soft tissue inflammation is most prominent on the right, with a 4.0 cm collection of air and increased density along the right side of the back at the level of the liver, likely reflecting packing material within a drained soft tissue abscess cavity. Soft tissue inflammation extends inferiorly along the back to the level of the iliac wings. No additional abscesses are identified. There is no evidence of intra-abdominal involvement.  The liver and spleen are unremarkable in appearance. The gallbladder is within normal limits. The pancreas and adrenal glands are unremarkable.  Mild nonspecific perinephric stranding is noted bilaterally. The kidneys are otherwise unremarkable. There is no evidence of hydronephrosis. No renal or ureteral stones are seen. No perinephric stranding is appreciated.  No free fluid is identified. The small bowel is unremarkable in appearance. The stomach is within normal limits. No acute vascular abnormalities are seen. Minimal calcification seen at the aortic bifurcation.  The appendix is normal in caliber, without evidence for appendicitis. The colon is unremarkable in appearance.  The bladder is mildly distended and grossly unremarkable. The prostate is enlarged, measuring 6.4 cm in transverse dimension. No inguinal lymphadenopathy is seen.  No acute osseous abnormalities are identified.  IMPRESSION: 1. 4.0 cm collection of air and increased density along the right side of the back at the level  of the liver likely reflects packing  material within the drained soft tissue abscess cavity. Diffuse skin thickening noted along the upper back, compatible with diffuse cellulitis. Underlying soft tissue inflammation is more prominent on the right, and extends inferiorly along the back to the level of the iliac wings. No evidence of intra-abdominal involvement. 2. No additional abscesses seen. 3. Enlarged prostate noted.   Electronically Signed   By: Garald Balding M.D.   On: 09/07/2014 01:38   Dg Chest Port 1 View  09/06/2014   CLINICAL DATA:  Abscess about lower right back.  Diabetes.  Smoker.  EXAM: PORTABLE CHEST - 1 VIEW  COMPARISON:  None.  FINDINGS: Midline trachea. Normal heart size. No pleural effusion or pneumothorax. There may be minimal right upper lobe scarring laterally.  IMPRESSION: No acute cardiopulmonary disease.   Electronically Signed   By: Abigail Miyamoto M.D.   On: 09/06/2014 23:24         Subjective: Patient is feeling better today. Pain is better on his back. Denies any fevers, chills, chest pain, breath, nausea, vomiting, diarrhea, abdominal pain  Objective: Filed Vitals:   09/07/14 2045 09/08/14 0152 09/08/14 0501 09/08/14 1505  BP: 141/79 132/72 137/77 124/88  Pulse: 104 103 100 85  Temp: 98.6 F (37 C) 99.1 F (37.3 C) 98.9 F (37.2 C) 98.2 F (36.8 C)  TempSrc:  Oral Oral Oral  Resp: 30 28 26 22   Height:      Weight:      SpO2: 99% 100% 99% 100%    Intake/Output Summary (Last 24 hours) at 09/08/14 1753 Last data filed at 09/08/14 1506  Gross per 24 hour  Intake   2120 ml  Output    700 ml  Net   1420 ml   Weight change:  Exam:   General:  Pt is alert, follows commands appropriately, not in acute distress  HEENT: No icterus, No thrush,  /AT  Cardiovascular: RRR, S1/S2, no rubs, no gallops  Respiratory: CTA bilaterally, no wheezing, no crackles, no rhonchi  Abdomen: Soft/+BS, non tender, non distended, no guarding  Extremities: No edema, No lymphangitis, No petechiae, No  rashes, no synovitis;  R-flank with surgical wound--small patches of devitalized tissue without pus or crepitance  Data Reviewed: Basic Metabolic Panel:  Recent Labs Lab 09/06/14 1901 09/07/14 0345 09/08/14 0449  NA 135 135 135  K 4.2 3.6 3.5  CL 97 101 99  CO2 24 23 27   GLUCOSE 314* 236* 267*  BUN 11 10 9   CREATININE 0.85 0.67 0.83  CALCIUM 9.0 8.1* 8.2*  MG  --  1.5  --   PHOS  --  2.4  --    Liver Function Tests:  Recent Labs Lab 09/07/14 0345  AST 13  ALT 15  ALKPHOS 110  BILITOT 1.1  PROT 6.4  ALBUMIN 2.5*   No results for input(s): LIPASE, AMYLASE in the last 168 hours. No results for input(s): AMMONIA in the last 168 hours. CBC:  Recent Labs Lab 09/06/14 1901 09/07/14 0345 09/08/14 0449  WBC 24.8* 21.7* 14.2*  NEUTROABS 20.6*  --   --   HGB 12.8* 11.2* 11.1*  HCT 38.9* 34.4* 34.3*  MCV 85.1 84.7 84.7  PLT 373 297 382   Cardiac Enzymes: No results for input(s): CKTOTAL, CKMB, CKMBINDEX, TROPONINI in the last 168 hours. BNP: Invalid input(s): POCBNP CBG:  Recent Labs Lab 09/08/14 0047 09/08/14 0459 09/08/14 0738 09/08/14 1149 09/08/14 1649  GLUCAP 245* 265* 290* 230* 301*  Recent Results (from the past 240 hour(s))  Blood culture (routine x 2)     Status: None (Preliminary result)   Collection Time: 09/06/14 10:22 PM  Result Value Ref Range Status   Specimen Description BLOOD RIGHT HAND  Final   Special Requests BOTTLES DRAWN AEROBIC AND ANAEROBIC 5ML  Final   Culture   Final           BLOOD CULTURE RECEIVED NO GROWTH TO DATE CULTURE WILL BE HELD FOR 5 DAYS BEFORE ISSUING A FINAL NEGATIVE REPORT Performed at [facility]    Report Status PENDING  Incomplete  Blood culture (routine x 2)     Status: None (Preliminary result)   Collection Time: 09/06/14 10:22 PM  Result Value Ref Range Status   Specimen Description BLOOD BLOOD RIGHT FOREARM  Final   Special Requests BOTTLES DRAWN AEROBIC AND ANAEROBIC 5ML  Final   Culture    Final           BLOOD CULTURE RECEIVED NO GROWTH TO DATE CULTURE WILL BE HELD FOR 5 DAYS BEFORE ISSUING A FINAL NEGATIVE REPORT Performed at [facility]    Report Status PENDING  Incomplete  Urine culture     Status: None   Collection Time: 09/06/14 11:07 PM  Result Value Ref Range Status   Specimen Description URINE, RANDOM  Final   Special Requests NONE  Final   Colony Count   Final    30,000 COLONIES/ML Performed at [facility]    Culture   Final    Multiple bacterial morphotypes present, none predominant. Suggest appropriate recollection if clinically indicated. Performed at [facility]    Report Status 09/08/2014 FINAL  Final  Surgical pcr screen     Status: Abnormal   Collection Time: 09/07/14 11:25 AM  Result Value Ref Range Status   MRSA, PCR POSITIVE (A) NEGATIVE Final    Comment: CRITICAL RESULT CALLED TO, READ BACK BY AND VERIFIED WITH: SISON,G AT 12:50PM ON 09/07/14 BY FESTERMAN,C    Staphylococcus aureus POSITIVE (A) NEGATIVE Final    Comment:        The Xpert SA Assay (FDA approved for NASAL specimens in patients over 49 years of age), is one component of a comprehensive surveillance program.  Test performance has been validated by [facility] for patients greater than or equal to 41 year old. It is not intended to diagnose infection nor to guide or monitor treatment.   Tissue culture     Status: None (Preliminary result)   Collection Time: 09/07/14  7:05 PM  Result Value Ref Range Status   Specimen Description ABSCESS RIGHT FLANK SKIN AND SUBCUTANEOUS TISSUE  Final   Special Requests PATIENT ON FOLLOWING ZOSYN AND VANCOMYCIN  Final   Gram Stain PENDING  Incomplete   Culture   Final    Culture reincubated for better growth Performed at [facility]    Report Status PENDING  Incomplete     Scheduled Meds: . docusate sodium  100 mg Oral BID  . enoxaparin (LOVENOX) injection  40 mg Subcutaneous Q24H  . insulin  aspart  0-15 Units Subcutaneous TID WC  . insulin aspart  0-5 Units Subcutaneous QHS  . insulin aspart protamine- aspart  15 Units Subcutaneous BID WC  . mupirocin ointment  1 application Nasal BID  . piperacillin-tazobactam (ZOSYN)  IV  3.375 g Intravenous 3 times per day  . sodium chloride  3 mL Intravenous Q12H  . vancomycin  1,000 mg  Intravenous 3 times per day   Continuous Infusions:    Thales Knipple, DO  [facility] Pager [phone]  If 7PM-7AM, please contact night-coverage [email or web address] Password [email or web address] 09/08/2014, 5:53 PM   LOS: 2 days

## 2014-09-08 NOTE — Progress Notes (Signed)
General Surgery Note  LOS: 2 days  POD -  1 Day Post-Op  Assessment/Plan: 1.  INCISION AND DRAINAGE ABSCESS RIGHT FLANK - 09/07/2014 - [facility]  WBC - 14,200 - 09/08/2014  Zosyn/Vanc  10 x 5 cm wound - relatively clean - to start dressing changes   2. DM - still poorly controlled  Glucose - 290 - 09/08/2014 3.  Smokes 4.  DVT prophylaxis - Lovenox 5.  MRSA PCR positive   Active Problems:   Abscess of lower back   DM2 (diabetes mellitus, type 2)   Sepsis   Diabetes mellitus with hyperglycemia   Tobacco abuse   Abscess of back  Subjective:  Doing okay - not much pain. Objective:   Filed Vitals:   09/08/14 0501  BP: 137/77  Pulse: 100  Temp: 98.9 F (37.2 C)  Resp: 26     Intake/Output from previous day:  03/05 0701 - 03/06 0700 In: 1760 [P.O.:360; I.V.:900; IV Piggyback:500] Out: 200 [Blood:200]  Intake/Output this shift:      Physical Exam:   General: WN AA M who is alert and oriented.    HEENT: Normal. Pupils equal. .   Lungs: Clear   Wound: 10 x 5 cm wound that is relatively clean   Lab Results:    Recent Labs  09/07/14 0345 09/08/14 0449  WBC 21.7* 14.2*  HGB 11.2* 11.1*  HCT 34.4* 34.3*  PLT 297 382    BMET   Recent Labs  09/07/14 0345 09/08/14 0449  NA 135 135  K 3.6 3.5  CL 101 99  CO2 23 27  GLUCOSE 236* 267*  BUN 10 9  CREATININE 0.67 0.83  CALCIUM 8.1* 8.2*    PT/INR  No results for input(s): LABPROT, INR in the last 72 hours.  ABG  No results for input(s): PHART, HCO3 in the last 72 hours.  Invalid input(s): PCO2, PO2   Studies/Results:  Ct Abdomen Pelvis W Contrast  09/07/2014   CLINICAL DATA:  Acute onset of right lower back pain and swelling, with drainage of pus and blood. Fever and tachycardia. Tachypnea. Initial encounter.  EXAM: CT ABDOMEN AND PELVIS WITH CONTRAST  TECHNIQUE: Multidetector CT imaging of the abdomen and pelvis was performed using the standard protocol following bolus administration of intravenous contrast.   CONTRAST:  53mL OMNIPAQUE IOHEXOL 300 MG/ML SOLN, 162mL OMNIPAQUE IOHEXOL 300 MG/ML SOLN  COMPARISON:  None.  FINDINGS: The visualized lung bases are clear.  There is diffuse skin thickening along the upper back, compatible with diffuse cellulitis. Underlying soft tissue inflammation is most prominent on the right, with a 4.0 cm collection of air and increased density along the right side of the back at the level of the liver, likely reflecting packing material within a drained soft tissue abscess cavity. Soft tissue inflammation extends inferiorly along the back to the level of the iliac wings. No additional abscesses are identified. There is no evidence of intra-abdominal involvement.  The liver and spleen are unremarkable in appearance. The gallbladder is within normal limits. The pancreas and adrenal glands are unremarkable.  Mild nonspecific perinephric stranding is noted bilaterally. The kidneys are otherwise unremarkable. There is no evidence of hydronephrosis. No renal or ureteral stones are seen. No perinephric stranding is appreciated.  No free fluid is identified. The small bowel is unremarkable in appearance. The stomach is within normal limits. No acute vascular abnormalities are seen. Minimal calcification seen at the aortic bifurcation.  The appendix is normal in caliber, without evidence for  appendicitis. The colon is unremarkable in appearance.  The bladder is mildly distended and grossly unremarkable. The prostate is enlarged, measuring 6.4 cm in transverse dimension. No inguinal lymphadenopathy is seen.  No acute osseous abnormalities are identified.  IMPRESSION: 1. 4.0 cm collection of air and increased density along the right side of the back at the level of the liver likely reflects packing material within the drained soft tissue abscess cavity. Diffuse skin thickening noted along the upper back, compatible with diffuse cellulitis. Underlying soft tissue inflammation is more prominent on the  right, and extends inferiorly along the back to the level of the iliac wings. No evidence of intra-abdominal involvement. 2. No additional abscesses seen. 3. Enlarged prostate noted.   Electronically Signed   By: Garald Balding M.D.   On: 09/07/2014 01:38   Dg Chest Port 1 View  09/06/2014   CLINICAL DATA:  Abscess about lower right back.  Diabetes.  Smoker.  EXAM: PORTABLE CHEST - 1 VIEW  COMPARISON:  None.  FINDINGS: Midline trachea. Normal heart size. No pleural effusion or pneumothorax. There may be minimal right upper lobe scarring laterally.  IMPRESSION: No acute cardiopulmonary disease.   Electronically Signed   By: Abigail Miyamoto M.D.   On: 09/06/2014 23:24     Anti-infectives:   Anti-infectives    Start     Dose/Rate Route Frequency Ordered Stop   09/07/14 0645  vancomycin (VANCOCIN) IVPB 1000 mg/200 mL premix     1,000 mg 200 mL/hr over 60 Minutes Intravenous 3 times per day 09/07/14 0631     09/07/14 0030  piperacillin-tazobactam (ZOSYN) IVPB 3.375 g     3.375 g 12.5 mL/hr over 240 Minutes Intravenous 3 times per day 09/07/14 0017     09/06/14 2130  vancomycin (VANCOCIN) IVPB 1000 mg/200 mL premix     1,000 mg 200 mL/hr over 60 Minutes Intravenous  Once 09/06/14 2122 09/06/14 2307      Alphonsa Overall, MD, FACS Pager: [facility]: [phone] 09/08/2014

## 2014-09-09 ENCOUNTER — Encounter (HOSPITAL_COMMUNITY): Payer: Self-pay | Admitting: General Surgery

## 2014-09-09 LAB — CBC
HEMATOCRIT: 33.3 % — AB (ref 39.0–52.0)
HEMOGLOBIN: 10.6 g/dL — AB (ref 13.0–17.0)
MCH: 27.2 pg (ref 26.0–34.0)
MCHC: 31.8 g/dL (ref 30.0–36.0)
MCV: 85.4 fL (ref 78.0–100.0)
PLATELETS: 380 10*3/uL (ref 150–400)
RBC: 3.9 MIL/uL — AB (ref 4.22–5.81)
RDW: 13.8 % (ref 11.5–15.5)
WBC: 6.8 10*3/uL (ref 4.0–10.5)

## 2014-09-09 LAB — GLUCOSE, CAPILLARY
GLUCOSE-CAPILLARY: 363 mg/dL — AB (ref 70–99)
GLUCOSE-CAPILLARY: 212 mg/dL — AB (ref 70–99)
GLUCOSE-CAPILLARY: 153 mg/dL — AB (ref 70–99)
Glucose-Capillary: 224 mg/dL — ABNORMAL HIGH (ref 70–99)
Glucose-Capillary: 301 mg/dL — ABNORMAL HIGH (ref 70–99)
Glucose-Capillary: 221 mg/dL — ABNORMAL HIGH (ref 70–99)

## 2014-09-09 LAB — HEMOGLOBIN A1C
Hgb A1c MFr Bld: 13.1 % — ABNORMAL HIGH (ref 4.8–5.6)
MEAN PLASMA GLUCOSE: 329 mg/dL

## 2014-09-09 LAB — VANCOMYCIN, TROUGH: Vancomycin Tr: 9.8 ug/mL — ABNORMAL LOW (ref 10.0–20.0)

## 2014-09-09 MED ORDER — VANCOMYCIN HCL 10 G IV SOLR
1250.0000 mg | Freq: Three times a day (TID) | INTRAVENOUS | Status: DC
  Administered 2014-09-09 – 2014-09-11 (×6): 1250 mg via INTRAVENOUS
  Filled 2014-09-09 (×8): qty 1250

## 2014-09-09 MED ORDER — VANCOMYCIN HCL 10 G IV SOLR
1500.0000 mg | Freq: Once | INTRAVENOUS | Status: AC
  Administered 2014-09-09: 1500 mg via INTRAVENOUS
  Filled 2014-09-09: qty 1500

## 2014-09-09 MED ORDER — INSULIN ASPART PROT & ASPART (70-30 MIX) 100 UNIT/ML ~~LOC~~ SUSP
30.0000 [IU] | Freq: Two times a day (BID) | SUBCUTANEOUS | Status: DC
  Administered 2014-09-09 – 2014-09-10 (×3): 30 [IU] via SUBCUTANEOUS

## 2014-09-09 NOTE — Progress Notes (Signed)
ANTIBIOTIC CONSULT NOTE - FOLLOW UP  Pharmacy Consult for vancomycin Indication: abscess/sepsis  No Known Allergies  Patient Measurements: Height: 5\' 10"  (177.8 cm) Weight: 197 lb 8 oz (89.585 kg) IBW/kg (Calculated) : 73 Adjusted Body Weight:   Vital Signs: Temp: 97.9 F (36.6 C) (03/07 0431) Temp Source: Oral (03/07 0431) BP: 134/88 mmHg (03/07 0431) Pulse Rate: 81 (03/07 0431) Intake/Output from previous day: 03/06 0701 - 03/07 0700 In: 600 [P.O.:600] Out: 1700 [Urine:1700] Intake/Output from this shift: Total I/O In: -  Out: 1200 [Urine:1200]  Labs:  Recent Labs  09/06/14 1901 09/07/14 0345 09/08/14 0449  WBC 24.8* 21.7* 14.2*  HGB 12.8* 11.2* 11.1*  PLT 373 297 382  CREATININE 0.85 0.67 0.83   Estimated Creatinine Clearance: 130.5 mL/min (by C-G formula based on Cr of 0.83).  Recent Labs  09/09/14 0531  VANCOTROUGH 9.8*     Microbiology: Recent Results (from the past 720 hour(s))  Blood culture (routine x 2)     Status: None (Preliminary result)   Collection Time: 09/06/14 10:22 PM  Result Value Ref Range Status   Specimen Description BLOOD RIGHT HAND  Final   Special Requests BOTTLES DRAWN AEROBIC AND ANAEROBIC 5ML  Final   Culture   Final           BLOOD CULTURE RECEIVED NO GROWTH TO DATE CULTURE WILL BE HELD FOR 5 DAYS BEFORE ISSUING A FINAL NEGATIVE REPORT Performed at [facility]    Report Status PENDING  Incomplete  Blood culture (routine x 2)     Status: None (Preliminary result)   Collection Time: 09/06/14 10:22 PM  Result Value Ref Range Status   Specimen Description BLOOD BLOOD RIGHT FOREARM  Final   Special Requests BOTTLES DRAWN AEROBIC AND ANAEROBIC 5ML  Final   Culture   Final           BLOOD CULTURE RECEIVED NO GROWTH TO DATE CULTURE WILL BE HELD FOR 5 DAYS BEFORE ISSUING A FINAL NEGATIVE REPORT Performed at [facility]    Report Status PENDING  Incomplete  Urine culture     Status: None   Collection Time:  09/06/14 11:07 PM  Result Value Ref Range Status   Specimen Description URINE, RANDOM  Final   Special Requests NONE  Final   Colony Count   Final    30,000 COLONIES/ML Performed at [facility]    Culture   Final    Multiple bacterial morphotypes present, none predominant. Suggest appropriate recollection if clinically indicated. Performed at [facility]    Report Status 09/08/2014 FINAL  Final  Surgical pcr screen     Status: Abnormal   Collection Time: 09/07/14 11:25 AM  Result Value Ref Range Status   MRSA, PCR POSITIVE (A) NEGATIVE Final    Comment: CRITICAL RESULT CALLED TO, READ BACK BY AND VERIFIED WITH: SISON,G AT 12:50PM ON 09/07/14 BY FESTERMAN,C    Staphylococcus aureus POSITIVE (A) NEGATIVE Final    Comment:        The Xpert SA Assay (FDA approved for NASAL specimens in patients over 74 years of age), is one component of a comprehensive surveillance program.  Test performance has been validated by [facility] for patients greater than or equal to 53 year old. It is not intended to diagnose infection nor to guide or monitor treatment.   Tissue culture     Status: None (Preliminary result)   Collection Time: 09/07/14  7:05 PM  Result Value Ref Range Status  Specimen Description ABSCESS RIGHT FLANK SKIN AND SUBCUTANEOUS TISSUE  Final   Special Requests PATIENT ON FOLLOWING ZOSYN AND VANCOMYCIN  Final   Gram Stain PENDING  Incomplete   Culture   Final    Culture reincubated for better growth Performed at [facility]    Report Status PENDING  Incomplete    Anti-infectives    Start     Dose/Rate Route Frequency Ordered Stop   09/09/14 1400  vancomycin (VANCOCIN) 1,250 mg in sodium chloride 0.9 % 250 mL IVPB     1,250 mg 166.7 mL/hr over 90 Minutes Intravenous 3 times per day 09/09/14 0626     09/09/14 0630  vancomycin (VANCOCIN) 1,500 mg in sodium chloride 0.9 % 500 mL IVPB     1,500 mg 250 mL/hr over 120 Minutes Intravenous   Once 09/09/14 0626     09/07/14 0645  vancomycin (VANCOCIN) IVPB 1000 mg/200 mL premix  Status:  Discontinued     1,000 mg 200 mL/hr over 60 Minutes Intravenous 3 times per day 09/07/14 0631 09/09/14 0626   09/07/14 0030  piperacillin-tazobactam (ZOSYN) IVPB 3.375 g     3.375 g 12.5 mL/hr over 240 Minutes Intravenous 3 times per day 09/07/14 0017     09/06/14 2130  vancomycin (VANCOCIN) IVPB 1000 mg/200 mL premix     1,000 mg 200 mL/hr over 60 Minutes Intravenous  Once 09/06/14 2122 09/06/14 2307      Assessment: Patient with vancomycin level below goal.  Prior doses charted correctly.    Goal of Therapy:  Vancomycin trough level 15-20 mcg/ml  Plan:  Measure antibiotic drug levels at steady state Follow up culture results Vancomycin 1500mg  iv x1 now, then 1250mg  iv q8hr  Tyler Deis, Shea Stakes Crowford 09/09/2014,6:28 AM

## 2014-09-09 NOTE — Progress Notes (Signed)
PROGRESS NOTE  Todd Mccoy [id number] DOB: 05/11/1971 DOA: 09/06/2014 PCP: No primary care provider on file.  Assessment/Plan:  Brief history 44 year old male with a history of diabetes mellitus presents with one-week history of right flank/back pain. He noted some induration that began 1 week prior to this admission. It got worse over the period of this past week. He has some associated nausea and vomiting on the day prior to admission without any hematemesis or hematochezia. The patient was admitted to the hospital on 06/18/2014 where he had incision and drainage of scrotal abscess. The patient has not been on any recent antibiotics. CT of the abdomen and pelvis in the emergency department revealed a 4 cm air pocket with diffuse skin thickening on his right flank area at the level of the liver. Bedside drainage was performed in the emergency department. The patient was febrile with WBC 24.8. Assessment/Plan: Sepsis -resolved  -Present at the time of admission -Secondary to back abscess -Continue empiric antibiotics pending culture data Right back/flank abscess/necrotizing fasciitis -back incision in ED but still significant induration and fluctuance -consulted surgery for possible further debridement--spoke with Dr. Zella Richer -09/07/14 debridement-->necrotizing fascia noted intraop -09/06/14 CT shows 4 cm area of air pocket in the right side of the back at the level of the liver with diffuse skin thickening -HIV antibody--neg -Intraoperative cultures= Staphylococcus aureus -continue vancomycin -Discontinue Zosyn -hydrotherapy per surgery--appreciate follow up Diabetes mellitus type 2 -Previously taking 70/30 at home-only using sporadically due to financial issues -restart 70/30 while in hospital--increase to 30 units bid -check A1c-13.1 Tobacco Abuse -cessation discussed  Dispo:  Home when cleared by surgery       Procedures/Studies: Ct Abdomen Pelvis W  Contrast  09/07/2014   CLINICAL DATA:  Acute onset of right lower back pain and swelling, with drainage of pus and blood. Fever and tachycardia. Tachypnea. Initial encounter.  EXAM: CT ABDOMEN AND PELVIS WITH CONTRAST  TECHNIQUE: Multidetector CT imaging of the abdomen and pelvis was performed using the standard protocol following bolus administration of intravenous contrast.  CONTRAST:  42mL OMNIPAQUE IOHEXOL 300 MG/ML SOLN, 146mL OMNIPAQUE IOHEXOL 300 MG/ML SOLN  COMPARISON:  None.  FINDINGS: The visualized lung bases are clear.  There is diffuse skin thickening along the upper back, compatible with diffuse cellulitis. Underlying soft tissue inflammation is most prominent on the right, with a 4.0 cm collection of air and increased density along the right side of the back at the level of the liver, likely reflecting packing material within a drained soft tissue abscess cavity. Soft tissue inflammation extends inferiorly along the back to the level of the iliac wings. No additional abscesses are identified. There is no evidence of intra-abdominal involvement.  The liver and spleen are unremarkable in appearance. The gallbladder is within normal limits. The pancreas and adrenal glands are unremarkable.  Mild nonspecific perinephric stranding is noted bilaterally. The kidneys are otherwise unremarkable. There is no evidence of hydronephrosis. No renal or ureteral stones are seen. No perinephric stranding is appreciated.  No free fluid is identified. The small bowel is unremarkable in appearance. The stomach is within normal limits. No acute vascular abnormalities are seen. Minimal calcification seen at the aortic bifurcation.  The appendix is normal in caliber, without evidence for appendicitis. The colon is unremarkable in appearance.  The bladder is mildly distended and grossly unremarkable. The prostate is enlarged, measuring 6.4 cm in transverse dimension. No inguinal lymphadenopathy is seen.  No acute osseous  abnormalities are identified.  IMPRESSION: 1. 4.0 cm collection of air and increased density along the right side of the back at the level of the liver likely reflects packing material within the drained soft tissue abscess cavity. Diffuse skin thickening noted along the upper back, compatible with diffuse cellulitis. Underlying soft tissue inflammation is more prominent on the right, and extends inferiorly along the back to the level of the iliac wings. No evidence of intra-abdominal involvement. 2. No additional abscesses seen. 3. Enlarged prostate noted.   Electronically Signed   By: Garald Balding M.D.   On: 09/07/2014 01:38   Dg Chest Port 1 View  09/06/2014   CLINICAL DATA:  Abscess about lower right back.  Diabetes.  Smoker.  EXAM: PORTABLE CHEST - 1 VIEW  COMPARISON:  None.  FINDINGS: Midline trachea. Normal heart size. No pleural effusion or pneumothorax. There may be minimal right upper lobe scarring laterally.  IMPRESSION: No acute cardiopulmonary disease.   Electronically Signed   By: Abigail Miyamoto M.D.   On: 09/06/2014 23:24         Subjective: Patient states that he still had some pain with hydrotherapy today but overall it is much better on his back. Denies fevers, chills, chest pain, short of breath, nausea, vomiting, diarrhea, abdominal pain.   Objective: Filed Vitals:   09/08/14 2123 09/09/14 0431 09/09/14 0920 09/09/14 1300  BP: 112/73 134/88 117/69 120/73  Pulse: 94 81 91 88  Temp: 98.3 F (36.8 C) 97.9 F (36.6 C) 98.1 F (36.7 C) 98 F (36.7 C)  TempSrc: Oral Oral Oral Oral  Resp: 20 18 16 16   Height:      Weight:      SpO2: 100% 99% 100% 100%    Intake/Output Summary (Last 24 hours) at 09/09/14 1634 Last data filed at 09/09/14 1300  Gross per 24 hour  Intake    480 ml  Output   1200 ml  Net   -720 ml   Weight change:  Exam:   General:  Pt is alert, follows commands appropriately, not in acute distress  HEENT: No icterus, No thrush,   /AT  Cardiovascular: RRR, S1/S2, no rubs, no gallops  Respiratory: CTA bilaterally, no wheezing, no crackles, no rhonchi  Abdomen: Soft/+BS, non tender, non distended, no guarding  Extremities: No edema, No lymphangitis, No petechiae, No rashes, no synovitis; right flank   surgical incision with good granulation. Scattered areas of devitalized tissue. No pus. No crepitance. Data Reviewed: Basic Metabolic Panel:  Recent Labs Lab 09/06/14 1901 09/07/14 0345 09/08/14 0449  NA 135 135 135  K 4.2 3.6 3.5  CL 97 101 99  CO2 24 23 27   GLUCOSE 314* 236* 267*  BUN 11 10 9   CREATININE 0.85 0.67 0.83  CALCIUM 9.0 8.1* 8.2*  MG  --  1.5  --   PHOS  --  2.4  --    Liver Function Tests:  Recent Labs Lab 09/07/14 0345  AST 13  ALT 15  ALKPHOS 110  BILITOT 1.1  PROT 6.4  ALBUMIN 2.5*   No results for input(s): LIPASE, AMYLASE in the last 168 hours. No results for input(s): AMMONIA in the last 168 hours. CBC:  Recent Labs Lab 09/06/14 1901 09/07/14 0345 09/08/14 0449 09/09/14 1139  WBC 24.8* 21.7* 14.2* 6.8  NEUTROABS 20.6*  --   --   --   HGB 12.8* 11.2* 11.1* 10.6*  HCT 38.9* 34.4* 34.3* 33.3*  MCV 85.1 84.7 84.7 85.4  PLT  373 297 382 380   Cardiac Enzymes: No results for input(s): CKTOTAL, CKMB, CKMBINDEX, TROPONINI in the last 168 hours. BNP: Invalid input(s): POCBNP CBG:  Recent Labs Lab 09/08/14 2013 09/08/14 2333 09/09/14 0403 09/09/14 0842 09/09/14 1149  GLUCAP 224* 292* 363* 301* 212*    Recent Results (from the past 240 hour(s))  Blood culture (routine x 2)     Status: None (Preliminary result)   Collection Time: 09/06/14 10:22 PM  Result Value Ref Range Status   Specimen Description BLOOD RIGHT HAND  Final   Special Requests BOTTLES DRAWN AEROBIC AND ANAEROBIC 5ML  Final   Culture   Final           BLOOD CULTURE RECEIVED NO GROWTH TO DATE CULTURE WILL BE HELD FOR 5 DAYS BEFORE ISSUING A FINAL NEGATIVE REPORT Performed at [facility]    Report Status PENDING  Incomplete  Blood culture (routine x 2)     Status: None (Preliminary result)   Collection Time: 09/06/14 10:22 PM  Result Value Ref Range Status   Specimen Description BLOOD BLOOD RIGHT FOREARM  Final   Special Requests BOTTLES DRAWN AEROBIC AND ANAEROBIC 5ML  Final   Culture   Final           BLOOD CULTURE RECEIVED NO GROWTH TO DATE CULTURE WILL BE HELD FOR 5 DAYS BEFORE ISSUING A FINAL NEGATIVE REPORT Performed at [facility]    Report Status PENDING  Incomplete  Urine culture     Status: None   Collection Time: 09/06/14 11:07 PM  Result Value Ref Range Status   Specimen Description URINE, RANDOM  Final   Special Requests NONE  Final   Colony Count   Final    30,000 COLONIES/ML Performed at [facility]    Culture   Final    Multiple bacterial morphotypes present, none predominant. Suggest appropriate recollection if clinically indicated. Performed at [facility]    Report Status 09/08/2014 FINAL  Final  Surgical pcr screen     Status: Abnormal   Collection Time: 09/07/14 11:25 AM  Result Value Ref Range Status   MRSA, PCR POSITIVE (A) NEGATIVE Final    Comment: CRITICAL RESULT CALLED TO, READ BACK BY AND VERIFIED WITH: SISON,G AT 12:50PM ON 09/07/14 BY FESTERMAN,C    Staphylococcus aureus POSITIVE (A) NEGATIVE Final    Comment:        The Xpert SA Assay (FDA approved for NASAL specimens in patients over 108 years of age), is one component of a comprehensive surveillance program.  Test performance has been validated by [facility] for patients greater than or equal to 7 year old. It is not intended to diagnose infection nor to guide or monitor treatment.   Tissue culture     Status: None (Preliminary result)   Collection Time: 09/07/14  7:05 PM  Result Value Ref Range Status   Specimen Description ABSCESS RIGHT FLANK SKIN AND SUBCUTANEOUS TISSUE  Final   Special Requests PATIENT ON FOLLOWING ZOSYN AND  VANCOMYCIN  Final   Gram Stain PENDING  Incomplete   Culture   Final    ABUNDANT STAPHYLOCOCCUS AUREUS Note: RIFAMPIN AND GENTAMICIN SHOULD NOT BE USED AS SINGLE DRUGS FOR TREATMENT OF STAPH INFECTIONS. Performed at [facility]    Report Status PENDING  Incomplete     Scheduled Meds: . docusate sodium  100 mg Oral BID  . enoxaparin (LOVENOX) injection  40 mg Subcutaneous Q24H  . insulin aspart  0-15 Units Subcutaneous TID WC  . insulin aspart  0-5 Units Subcutaneous QHS  . insulin aspart protamine- aspart  30 Units Subcutaneous BID WC  . mupirocin ointment  1 application Nasal BID  . sodium chloride  3 mL Intravenous Q12H  . vancomycin  1,250 mg Intravenous 3 times per day   Continuous Infusions:    Khristen Cheyney, DO  [facility] Pager [phone]  If 7PM-7AM, please contact night-coverage [email or web address] Password [email or web address] 09/09/2014, 4:34 PM   LOS: 3 days

## 2014-09-09 NOTE — Progress Notes (Signed)
PT--HYDROTHERAPY---EVALUATION  09/09/14 1600  Subjective Assessment  Patient and Family Stated Goals wound healed/get rid of infection  Date of Onset 09/09/14  Prior Treatments surgical I and D  Evaluation and Treatment  Evaluation and Treatment Procedures Explained to Patient/Family Yes  Evaluation and Treatment Procedures agreed to  Wound / Incision (Open or Dehisced) 09/09/14 Incision - Open Rib Right I and D abscess right flank  Date First Assessed/Time First Assessed: 09/09/14 1311   Wound Type: Incision - Open  Location: Rib  Location Orientation: Right  Wound Description (Comments): I and D abscess right flank  Present on Admission: (c)   Dressing Type Moist to dry  Dressing Changed Changed  Dressing Change Frequency Daily  Site / Wound Assessment Painful;Yellow;Red  % Wound base Red or Granulating 65%  % Wound base Yellow 30%  % Wound base Other (Comment) 5% (potentially cauterized vessels)  Peri-wound Assessment Intact;Induration  Wound Length (cm) 3.5 cm  Wound Width (cm) 9.5 cm  Wound Depth (cm) 2.8 cm  Tunneling (cm) (fold at base of wound, not true tunnel)  Closure None  Drainage Amount Minimal  Drainage Description Serous;No odor  Treatment Cleansed;Debridement (Selective);Hydrotherapy (Pulse lavage);Packing (Saline gauze)  Hydrotherapy  Pulsed Lavage with Suction (psi) 4 psi  Pulsed Lavage with Suction - Normal Saline Used 1000 mL  Pulsed Lavage Tip Tip with splash shield  Pulsed lavage therapy - wound location right flank  Wound Therapy - Assess/Plan/Recommendations  Wound Therapy - Clinical Statement pt will benefit from PT/hydrotherapy to address wound healing through cleansing, debridement and education  Wound Therapy - Functional Problem List painful mobility  Factors Delaying/Impairing Wound Healing Diabetes Mellitus;Tobacco use  Hydrotherapy Plan Debridement;Dressing change;Patient/family education;Pulsatile lavage with suction  Wound Therapy - Frequency 6X  / week  Wound Therapy - Follow Up Recommendations Home health RN  Wound Plan will continue to follow for cleansing, debridement, education  Wound Therapy Goals - Improve the function of patient's integumentary system by progressing the wound(s) through the phases of wound healing by:  Decrease Necrotic Tissue to 20  Decrease Necrotic Tissue - Progress Goal set today  Increase Granulation Tissue to 80  Increase Granulation Tissue - Progress Goal set today  Kenyon Ana, PT Pager: [phone] 09/09/2014

## 2014-09-09 NOTE — Progress Notes (Signed)
Patient ID: Todd Mccoy, male   DOB: 1971/03/17, 44 y.o.   MRN: [id number] 2 Days Post-Op  Subjective: Pt feels better today.  Less pain  Objective: Vital signs in last 24 hours: Temp:  [97.9 F (36.6 C)-98.3 F (36.8 C)] 98.1 F (36.7 C) (03/07 0920) Pulse Rate:  [81-94] 91 (03/07 0920) Resp:  [16-22] 16 (03/07 0920) BP: (112-134)/(69-88) 117/69 mmHg (03/07 0920) SpO2:  [99 %-100 %] 100 % (03/07 0920) Last BM Date: 09/08/14  Intake/Output from previous day: 03/06 0701 - 03/07 0700 In: 600 [P.O.:600] Out: 1700 [Urine:1700] Intake/Output this shift: Total I/O In: 360 [P.O.:360] Out: -   PE: Skin: wound is generally clean.  Still has a decent amount of fibrin tissue present. Some of the drainage is cloudy appearing, but no further purulent drainage or other pockets are noted.  Lab Results:   Recent Labs  09/07/14 0345 09/08/14 0449  WBC 21.7* 14.2*  HGB 11.2* 11.1*  HCT 34.4* 34.3*  PLT 297 382   BMET  Recent Labs  09/07/14 0345 09/08/14 0449  NA 135 135  K 3.6 3.5  CL 101 99  CO2 23 27  GLUCOSE 236* 267*  BUN 10 9  CREATININE 0.67 0.83  CALCIUM 8.1* 8.2*   PT/INR No results for input(s): LABPROT, INR in the last 72 hours. CMP     Component Value Date/Time   NA 135 09/08/2014 0449   K 3.5 09/08/2014 0449   CL 99 09/08/2014 0449   CO2 27 09/08/2014 0449   GLUCOSE 267* 09/08/2014 0449   BUN 9 09/08/2014 0449   CREATININE 0.83 09/08/2014 0449   CALCIUM 8.2* 09/08/2014 0449   PROT 6.4 09/07/2014 0345   ALBUMIN 2.5* 09/07/2014 0345   AST 13 09/07/2014 0345   ALT 15 09/07/2014 0345   ALKPHOS 110 09/07/2014 0345   BILITOT 1.1 09/07/2014 0345   GFRNONAA >90 09/08/2014 0449   GFRAA >90 09/08/2014 0449   Lipase  No results found for: LIPASE     Studies/Results: No results found.  Anti-infectives: Anti-infectives    Start     Dose/Rate Route Frequency Ordered Stop   09/09/14 1400  vancomycin (VANCOCIN) 1,250 mg in sodium chloride 0.9 %  250 mL IVPB     1,250 mg 166.7 mL/hr over 90 Minutes Intravenous 3 times per day 09/09/14 0626     09/09/14 0630  vancomycin (VANCOCIN) 1,500 mg in sodium chloride 0.9 % 500 mL IVPB     1,500 mg 250 mL/hr over 120 Minutes Intravenous  Once 09/09/14 0626 09/09/14 0858   09/07/14 0645  vancomycin (VANCOCIN) IVPB 1000 mg/200 mL premix  Status:  Discontinued     1,000 mg 200 mL/hr over 60 Minutes Intravenous 3 times per day 09/07/14 0631 09/09/14 0626   09/07/14 0030  piperacillin-tazobactam (ZOSYN) IVPB 3.375 g     3.375 g 12.5 mL/hr over 240 Minutes Intravenous 3 times per day 09/07/14 0017     09/06/14 2130  vancomycin (VANCOCIN) IVPB 1000 mg/200 mL premix     1,000 mg 200 mL/hr over 60 Minutes Intravenous  Once 09/06/14 2122 09/06/14 2307       Assessment/Plan  1. INCISION AND DRAINAGE ABSCESS RIGHT FLANK - POD 2 - Rosenbower Zosyn/Vanc 10 x 5 cm wound - will add hydrotherapy today to continue cleaning the wound along with dressing changes  2. DM - per medicine 3. Smokes 4. DVT prophylaxis - Lovenox 5. MRSA PCR positive  LOS: 3 days    Berlie Persky E  09/09/2014, 10:24 AM Pager: [phone]

## 2014-09-10 DIAGNOSIS — A4102 Sepsis due to Methicillin resistant Staphylococcus aureus: Secondary | ICD-10-CM

## 2014-09-10 LAB — BASIC METABOLIC PANEL
Anion gap: 9 (ref 5–15)
BUN: 9 mg/dL (ref 6–23)
CO2: 27 mmol/L (ref 19–32)
CREATININE: 0.75 mg/dL (ref 0.50–1.35)
Calcium: 8.3 mg/dL — ABNORMAL LOW (ref 8.4–10.5)
Chloride: 101 mmol/L (ref 96–112)
GFR calc Af Amer: >90 mL/min (ref >90)
GFR calc non Af Amer: >90 mL/min (ref >90)
Glucose, Bld: 287 mg/dL — ABNORMAL HIGH (ref 70–99)
Potassium: 3.9 mmol/L (ref 3.5–5.1)
Sodium: 137 mmol/L (ref 135–145)

## 2014-09-10 LAB — GLUCOSE, CAPILLARY
GLUCOSE-CAPILLARY: 141 mg/dL — AB (ref 70–99)
GLUCOSE-CAPILLARY: 100 mg/dL — AB (ref 70–99)
Glucose-Capillary: 262 mg/dL — ABNORMAL HIGH (ref 70–99)
Glucose-Capillary: 276 mg/dL — ABNORMAL HIGH (ref 70–99)

## 2014-09-10 LAB — TISSUE CULTURE

## 2014-09-10 MED ORDER — INSULIN ASPART PROT & ASPART (70-30 MIX) 100 UNIT/ML ~~LOC~~ SUSP
35.0000 [IU] | Freq: Two times a day (BID) | SUBCUTANEOUS | Status: DC
  Administered 2014-09-11: 35 [IU] via SUBCUTANEOUS
  Filled 2014-09-10: qty 10

## 2014-09-10 NOTE — Progress Notes (Signed)
Received call from [facility] lab that patient's tissue culture taken from 3/5 is growing MRSA. Patient is already on contact precautions for positive MRSA PCR at this time.  MD paged with new results.  Will continue to monitor.

## 2014-09-10 NOTE — Progress Notes (Signed)
PROGRESS NOTE  Todd Mccoy [id number] DOB: 1970/08/03 DOA: 09/06/2014 PCP: No primary care provider on file. Brief history 44 year old male with a history of diabetes mellitus presents with one-week history of right flank/back pain. He noted some induration that began 1 week prior to this admission. It got worse over the period of this past week. He has some associated nausea and vomiting on the day prior to admission without any hematemesis or hematochezia. The patient was admitted to the hospital on 06/18/2014 where he had incision and drainage of scrotal abscess. The patient has not been on any recent antibiotics. CT of the abdomen and pelvis in the emergency department revealed a 4 cm air pocket with diffuse skin thickening on his right flank area at the level of the liver. Bedside drainage was performed in the emergency department. The patient was febrile with WBC 24.8. Assessment/Plan: Sepsis -resolved  -Present at the time of admission -Secondary to back abscess -Continue vancomycin Right back/flank abscess/necrotizing fasciitis -back incision in ED but still significant induration and fluctuance -consulted surgery for possible further debridement--spoke with Dr. Zella Richer -09/07/14 debridement-->necrotizing fascia noted intraop -09/06/14 CT shows 4 cm area of air pocket in the right side of the back at the level of the liver with diffuse skin thickening -HIV antibody--neg -Intraoperative cultures=MRSA -continue vancomycin -plan to go home with Bactrim DS, TWO tabs bid x 10 days if leaving 3/9 -Discontinue Zosyn -hydrotherapy per surgery--appreciate follow up Diabetes mellitus type 2 -Previously taking 70/30 at home-only using sporadically due to financial issues -restart 70/30 while in hospital--increase to 35 units bid -check A1c-13.1 Tobacco Abuse -cessation discussed   Family Communication:   Pt at beside Disposition Plan:   Home when cleared by  surgery       Procedures/Studies: Ct Abdomen Pelvis W Contrast  09/07/2014   CLINICAL DATA:  Acute onset of right lower back pain and swelling, with drainage of pus and blood. Fever and tachycardia. Tachypnea. Initial encounter.  EXAM: CT ABDOMEN AND PELVIS WITH CONTRAST  TECHNIQUE: Multidetector CT imaging of the abdomen and pelvis was performed using the standard protocol following bolus administration of intravenous contrast.  CONTRAST:  58mL OMNIPAQUE IOHEXOL 300 MG/ML SOLN, 156mL OMNIPAQUE IOHEXOL 300 MG/ML SOLN  COMPARISON:  None.  FINDINGS: The visualized lung bases are clear.  There is diffuse skin thickening along the upper back, compatible with diffuse cellulitis. Underlying soft tissue inflammation is most prominent on the right, with a 4.0 cm collection of air and increased density along the right side of the back at the level of the liver, likely reflecting packing material within a drained soft tissue abscess cavity. Soft tissue inflammation extends inferiorly along the back to the level of the iliac wings. No additional abscesses are identified. There is no evidence of intra-abdominal involvement.  The liver and spleen are unremarkable in appearance. The gallbladder is within normal limits. The pancreas and adrenal glands are unremarkable.  Mild nonspecific perinephric stranding is noted bilaterally. The kidneys are otherwise unremarkable. There is no evidence of hydronephrosis. No renal or ureteral stones are seen. No perinephric stranding is appreciated.  No free fluid is identified. The small bowel is unremarkable in appearance. The stomach is within normal limits. No acute vascular abnormalities are seen. Minimal calcification seen at the aortic bifurcation.  The appendix is normal in caliber, without evidence for appendicitis. The colon is unremarkable in appearance.  The bladder is mildly distended and grossly unremarkable. The prostate  is enlarged, measuring 6.4 cm in transverse  dimension. No inguinal lymphadenopathy is seen.  No acute osseous abnormalities are identified.  IMPRESSION: 1. 4.0 cm collection of air and increased density along the right side of the back at the level of the liver likely reflects packing material within the drained soft tissue abscess cavity. Diffuse skin thickening noted along the upper back, compatible with diffuse cellulitis. Underlying soft tissue inflammation is more prominent on the right, and extends inferiorly along the back to the level of the iliac wings. No evidence of intra-abdominal involvement. 2. No additional abscesses seen. 3. Enlarged prostate noted.   Electronically Signed   By: Garald Balding M.D.   On: 09/07/2014 01:38   Dg Chest Port 1 View  09/06/2014   CLINICAL DATA:  Abscess about lower right back.  Diabetes.  Smoker.  EXAM: PORTABLE CHEST - 1 VIEW  COMPARISON:  None.  FINDINGS: Midline trachea. Normal heart size. No pleural effusion or pneumothorax. There may be minimal right upper lobe scarring laterally.  IMPRESSION: No acute cardiopulmonary disease.   Electronically Signed   By: Abigail Miyamoto M.D.   On: 09/06/2014 23:24         Subjective: Patient denies fevers, chills, headache, chest pain, dyspnea, nausea, vomiting, diarrhea, abdominal pain, dysuria, hematuria   Objective: Filed Vitals:   09/10/14 0223 09/10/14 0618 09/10/14 1025 09/10/14 1400  BP: 134/83 140/91 104/71 123/82  Pulse: 87 90 82 82  Temp: 97.9 F (36.6 C) 98 F (36.7 C) 98.1 F (36.7 C) 98 F (36.7 C)  TempSrc: Oral Oral Oral Oral  Resp: 20 20 15 16   Height:      Weight:      SpO2: 100% 97% 100% 100%    Intake/Output Summary (Last 24 hours) at 09/10/14 1936 Last data filed at 09/10/14 1404  Gross per 24 hour  Intake    250 ml  Output    750 ml  Net   -500 ml   Weight change:  Exam:   General:  Pt is alert, follows commands appropriately, not in acute distress  HEENT: No icterus, No thrush, No neck mass,  [place]/AT  Cardiovascular: RRR, S1/S2, no rubs, no gallops  Respiratory: CTA bilaterally, no wheezing, no crackles, no rhonchi  Abdomen: Soft/+BS, non tender, non distended, no guarding  Extremities: No edema, No lymphangitis, No petechiae, No rashes, no synovitis.right flank  surgical incision with good granulation. Scattered areas of devitalized tissue. No pus. No crepitance  Data Reviewed: Basic Metabolic Panel:  Recent Labs Lab 09/06/14 1901 09/07/14 0345 09/08/14 0449 09/10/14 0457  NA 135 135 135 137  K 4.2 3.6 3.5 3.9  CL 97 101 99 101  CO2 24 23 27 27   GLUCOSE 314* 236* 267* 287*  BUN 11 10 9 9   CREATININE 0.85 0.67 0.83 0.75  CALCIUM 9.0 8.1* 8.2* 8.3*  MG  --  1.5  --   --   PHOS  --  2.4  --   --    Liver Function Tests:  Recent Labs Lab 09/07/14 0345  AST 13  ALT 15  ALKPHOS 110  BILITOT 1.1  PROT 6.4  ALBUMIN 2.5*   No results for input(s): LIPASE, AMYLASE in the last 168 hours. No results for input(s): AMMONIA in the last 168 hours. CBC:  Recent Labs Lab 09/06/14 1901 09/07/14 0345 09/08/14 0449 09/09/14 1139  WBC 24.8* 21.7* 14.2* 6.8  NEUTROABS 20.6*  --   --   --   HGB  12.8* 11.2* 11.1* 10.6*  HCT 38.9* 34.4* 34.3* 33.3*  MCV 85.1 84.7 84.7 85.4  PLT 373 297 382 380   Cardiac Enzymes: No results for input(s): CKTOTAL, CKMB, CKMBINDEX, TROPONINI in the last 168 hours. BNP: Invalid input(s): POCBNP CBG:  Recent Labs Lab 09/09/14 1650 09/09/14 2146 09/10/14 0741 09/10/14 1152 09/10/14 1701  GLUCAP 221* 153* 262* 276* 141*    Recent Results (from the past 240 hour(s))  Blood culture (routine x 2)     Status: None (Preliminary result)   Collection Time: 09/06/14 10:22 PM  Result Value Ref Range Status   Specimen Description BLOOD RIGHT HAND  Final   Special Requests BOTTLES DRAWN AEROBIC AND ANAEROBIC 5ML  Final   Culture   Final           BLOOD CULTURE RECEIVED NO GROWTH TO DATE CULTURE WILL BE HELD FOR 5 DAYS BEFORE  ISSUING A FINAL NEGATIVE REPORT Performed at [facility]    Report Status PENDING  Incomplete  Blood culture (routine x 2)     Status: None (Preliminary result)   Collection Time: 09/06/14 10:22 PM  Result Value Ref Range Status   Specimen Description BLOOD BLOOD RIGHT FOREARM  Final   Special Requests BOTTLES DRAWN AEROBIC AND ANAEROBIC 5ML  Final   Culture   Final           BLOOD CULTURE RECEIVED NO GROWTH TO DATE CULTURE WILL BE HELD FOR 5 DAYS BEFORE ISSUING A FINAL NEGATIVE REPORT Performed at [facility]    Report Status PENDING  Incomplete  Urine culture     Status: None   Collection Time: 09/06/14 11:07 PM  Result Value Ref Range Status   Specimen Description URINE, RANDOM  Final   Special Requests NONE  Final   Colony Count   Final    30,000 COLONIES/ML Performed at [facility]    Culture   Final    Multiple bacterial morphotypes present, none predominant. Suggest appropriate recollection if clinically indicated. Performed at [facility]    Report Status 09/08/2014 FINAL  Final  Surgical pcr screen     Status: Abnormal   Collection Time: 09/07/14 11:25 AM  Result Value Ref Range Status   MRSA, PCR POSITIVE (A) NEGATIVE Final    Comment: CRITICAL RESULT CALLED TO, READ BACK BY AND VERIFIED WITH: SISON,G AT 12:50PM ON 09/07/14 BY FESTERMAN,C    Staphylococcus aureus POSITIVE (A) NEGATIVE Final    Comment:        The Xpert SA Assay (FDA approved for NASAL specimens in patients over 33 years of age), is one component of a comprehensive surveillance program.  Test performance has been validated by [facility] for patients greater than or equal to 39 year old. It is not intended to diagnose infection nor to guide or monitor treatment.   Tissue culture     Status: None   Collection Time: 09/07/14  7:05 PM  Result Value Ref Range Status   Specimen Description ABSCESS RIGHT FLANK SKIN AND SUBCUTANEOUS TISSUE  Final   Special  Requests PATIENT ON FOLLOWING ZOSYN AND VANCOMYCIN  Final   Gram Stain   Final    FEW WBC PRESENT, PREDOMINANTLY PMN NO SQUAMOUS EPITHELIAL CELLS SEEN MODERATE GRAM POSITIVE COCCI IN PAIRS Performed at [facility]    Culture   Final    ABUNDANT METHICILLIN RESISTANT STAPHYLOCOCCUS AUREUS Note: RIFAMPIN AND GENTAMICIN SHOULD NOT BE USED AS SINGLE DRUGS FOR TREATMENT OF STAPH  INFECTIONS. CRITICAL RESULT CALLED TO, READ BACK BY AND VERIFIED WITH: H.HOLDERNESS 3/8 @ 1111 BY REAMM Performed at [facility]    Report Status 09/10/2014 FINAL  Final   Organism ID, Bacteria METHICILLIN RESISTANT STAPHYLOCOCCUS AUREUS  Final      Susceptibility   Methicillin resistant staphylococcus aureus - MIC*    CLINDAMYCIN <=0.25 SENSITIVE Sensitive     ERYTHROMYCIN 4 INTERMEDIATE Intermediate     GENTAMICIN <=0.5 SENSITIVE Sensitive     LEVOFLOXACIN 0.25 SENSITIVE Sensitive     OXACILLIN >=4 RESISTANT Resistant     PENICILLIN >=0.5 RESISTANT Resistant     RIFAMPIN <=0.5 SENSITIVE Sensitive     TRIMETH/SULFA <=10 SENSITIVE Sensitive     VANCOMYCIN 1 SENSITIVE Sensitive     TETRACYCLINE >=16 RESISTANT Resistant     * ABUNDANT METHICILLIN RESISTANT STAPHYLOCOCCUS AUREUS     Scheduled Meds: . docusate sodium  100 mg Oral BID  . enoxaparin (LOVENOX) injection  40 mg Subcutaneous Q24H  . insulin aspart  0-15 Units Subcutaneous TID WC  . insulin aspart  0-5 Units Subcutaneous QHS  . [START ON 09/11/2014] insulin aspart protamine- aspart  35 Units Subcutaneous BID WC  . mupirocin ointment  1 application Nasal BID  . sodium chloride  3 mL Intravenous Q12H  . vancomycin  1,250 mg Intravenous 3 times per day   Continuous Infusions:    Chanz Cahall, DO  [facility] Pager [phone]  If 7PM-7AM, please contact night-coverage [email or web address] Password [email or web address] 09/10/2014, 7:36 PM   LOS: 4 days

## 2014-09-10 NOTE — Progress Notes (Signed)
Patient ID: Todd Mccoy, male   DOB: Nov 02, 1970, 44 y.o.   MRN: [id number] 3 Days Post-Op  Subjective: Pt without any complaints today.  No having much pain  Objective: Vital signs in last 24 hours: Temp:  [97.9 F (36.6 C)-98.1 F (36.7 C)] 98 F (36.7 C) (03/08 0618) Pulse Rate:  [81-91] 90 (03/08 0618) Resp:  [16-20] 20 (03/08 0618) BP: (117-140)/(69-91) 140/91 mmHg (03/08 0618) SpO2:  [97 %-100 %] 97 % (03/08 0618) Last BM Date: 09/09/14  Intake/Output from previous day: 03/07 0701 - 03/08 0700 In: 720 [P.O.:720] Out: -  Intake/Output this shift: Total I/O In: -  Out: 750 [Urine:750]  PE: Skin: wound is actually somewhat cleaner today than yesterday, but still needs some work  Lab Results:   Recent Labs  09/08/14 0449 09/09/14 1139  WBC 14.2* 6.8  HGB 11.1* 10.6*  HCT 34.3* 33.3*  PLT 382 380   BMET  Recent Labs  09/08/14 0449 09/10/14 0457  NA 135 137  K 3.5 3.9  CL 99 101  CO2 27 27  GLUCOSE 267* 287*  BUN 9 9  CREATININE 0.83 0.75  CALCIUM 8.2* 8.3*   PT/INR No results for input(s): LABPROT, INR in the last 72 hours. CMP     Component Value Date/Time   NA 137 09/10/2014 0457   K 3.9 09/10/2014 0457   CL 101 09/10/2014 0457   CO2 27 09/10/2014 0457   GLUCOSE 287* 09/10/2014 0457   BUN 9 09/10/2014 0457   CREATININE 0.75 09/10/2014 0457   CALCIUM 8.3* 09/10/2014 0457   PROT 6.4 09/07/2014 0345   ALBUMIN 2.5* 09/07/2014 0345   AST 13 09/07/2014 0345   ALT 15 09/07/2014 0345   ALKPHOS 110 09/07/2014 0345   BILITOT 1.1 09/07/2014 0345   GFRNONAA >90 09/10/2014 0457   GFRAA >90 09/10/2014 0457   Lipase  No results found for: LIPASE     Studies/Results: No results found.  Anti-infectives: Anti-infectives    Start     Dose/Rate Route Frequency Ordered Stop   09/09/14 1400  vancomycin (VANCOCIN) 1,250 mg in sodium chloride 0.9 % 250 mL IVPB     1,250 mg 166.7 mL/hr over 90 Minutes Intravenous 3 times per day 09/09/14 0626      09/09/14 0630  vancomycin (VANCOCIN) 1,500 mg in sodium chloride 0.9 % 500 mL IVPB     1,500 mg 250 mL/hr over 120 Minutes Intravenous  Once 09/09/14 0626 09/09/14 0858   09/07/14 0645  vancomycin (VANCOCIN) IVPB 1000 mg/200 mL premix  Status:  Discontinued     1,000 mg 200 mL/hr over 60 Minutes Intravenous 3 times per day 09/07/14 0631 09/09/14 0626   09/07/14 0030  piperacillin-tazobactam (ZOSYN) IVPB 3.375 g  Status:  Discontinued     3.375 g 12.5 mL/hr over 240 Minutes Intravenous 3 times per day 09/07/14 0017 09/09/14 1555   09/06/14 2130  vancomycin (VANCOCIN) IVPB 1000 mg/200 mL premix     1,000 mg 200 mL/hr over 60 Minutes Intravenous  Once 09/06/14 2122 09/06/14 2307       Assessment/Plan   1. INCISION AND DRAINAGE ABSCESS RIGHT FLANK - POD 3 - Rosenbower Zosyn/Vanc, can likely switch to oral abx therapy at this point.  CX reveal staph aureus, still pending.  MRSA swab positive.  Would treat with either doxy or bactrum  10 x 5 cm wound - cont hydrotherapy, but likely after today and tomorrow's treatment patient will be stable for dc home.  Will follow  wound closely  2. DM - per medicine 3. Smokes 4. DVT prophylaxis - Lovenox  LOS: 4 days    Ameliana Brashear E 09/10/2014, 9:19 AM Pager: [phone]

## 2014-09-10 NOTE — Progress Notes (Signed)
*  HYDROTHERAPY TREATMENT NOTE*  09/10/14 1000  Subjective Assessment  Patient and Family Stated Goals wound healed/get rid of infection  Prior Treatments surgical I and D  Evaluation and Treatment  Evaluation and Treatment Procedures Explained to Patient/Family Yes  Evaluation and Treatment Procedures agreed to  Wound / Incision (Open or Dehisced) 09/09/14 Incision - Open Rib Right I and D abscess right flank  Date First Assessed/Time First Assessed: 09/09/14 1311   Wound Type: Incision - Open  Location: Rib  Location Orientation: Right  Wound Description (Comments): I and D abscess right flank  Present on Admission: (c)   Dressing Type Moist to dry  Dressing Change Frequency Daily  Site / Wound Assessment Painful;Yellow;Red  % Wound base Red or Granulating 65%  % Wound base Yellow 30%  % Wound base Other (Comment) 5% (cauterized vessels)  Peri-wound Assessment Intact;Induration  Wound Length (cm) (see eval)  Closure None  Drainage Amount Minimal  Drainage Description Serous;No odor  Non-staged Wound Description Not applicable  Treatment Debridement (Selective);Hydrotherapy (Pulse lavage);Packing (Saline gauze)  Hydrotherapy  Pulsed Lavage with Suction (psi) 4 psi (to 8psi)  Pulsed Lavage with Suction - Normal Saline Used 1000 mL  Pulsed Lavage Tip Tip with splash shield  Pulsed lavage therapy - wound location right flank  Selective Debridement  Selective Debridement - Location right flank  Selective Debridement - Tools Used Forceps;Scissors  Selective Debridement - Tissue Removed slough  Wound Therapy - Assess/Plan/Recommendations  Wound Therapy - Clinical Statement pt will benefit from PT/hydrotherapy to address wound healing through cleansing, debridement and education  Factors Delaying/Impairing Wound Healing Diabetes Mellitus;Tobacco use  Hydrotherapy Plan Debridement;Dressing change;Patient/family education;Pulsatile lavage with suction   Wound Therapy - Frequency 6X / week  Wound Therapy - Follow Up Recommendations Home health RN  Wound Plan will continue to follow for cleansing, debridement, education  Wound Therapy Goals - Improve the function of patient's integumentary system by progressing the wound(s) through the phases of wound healing by:  Decrease Necrotic Tissue to 20  Decrease Necrotic Tissue - Progress Progressing toward goal  Increase Granulation Tissue to 80  Increase Granulation Tissue - Progress Progressing toward goal  Goals/treatment plan/discharge plan were made with and agreed upon by patient/family Yes  Time For Goal Achievement 2 weeks  Wound Therapy - Potential for Goals Good  Kenyon Ana, PT Pager: [phone] 09/10/2014

## 2014-09-10 NOTE — Care Management Note (Addendum)
    Page 1 of 2   09/11/2014     12:21:42 PM CARE MANAGEMENT NOTE 09/11/2014  Patient:  ASAAD, WOHLER   Account Number:  [id number]  Date Initiated:  09/10/2014  Documentation initiated by:  Dessa Phi  Subjective/Objective Assessment:   44 y/o m admitted w/r flank abscess.     Action/Plan:   From home.no insurance.   Anticipated DC Date:  09/13/2014   Anticipated DC Plan:  [place]  CM consult  [facility]      Choice offered to / List presented to:  C-1 Patient        [place] arranged  HH-1 RN      [place].   Status of service:  Completed, signed off Medicare Important Message given?   (If response is "NO", the following Medicare IM given date fields will be blank) Date Medicare IM given:   Medicare IM given by:   Date Additional Medicare IM given:   Additional Medicare IM given by:    Discharge Disposition:  [place]  Per UR Regulation:  Reviewed for med. necessity/level of care/duration of stay  If discussed at [place] of Stay Meetings, dates discussed:    Comments:  09/11/14 Dessa Phi RN [name] NCM [phone] [place] orders received.[facility] rep [facility] aware & following.[facility] pharmacy aware of med needs-Novolog-samples will be given, if abx-doxy-$10, if bactrim-$4. All other meds are OTC, or narcotics-unable to assist with.Patient aslo informed of this he can afford.Ptient to go directly to [facility] pharmacy @ d/c-patient voiced understanding.[place] pcp f/u appt 3/11 @9a -walk in.No further d/c needs.  09/10/14 Dessa Phi RN [name] NCM [phone] [facility] HHRN-can follow for [facility]. [place] for pcp.Patient has sent in info for medicaid applic. Provided w/HHC agency list.Await choice.No insurance. Will have to pay out of pocket for [place].Recommend only HHRN, & SW-resources if [place] needed.Provided pcp listing encouraging [place], community resources, Environmental consultant.

## 2014-09-11 LAB — GLUCOSE, CAPILLARY
Glucose-Capillary: 229 mg/dL — ABNORMAL HIGH (ref 70–99)
Glucose-Capillary: 187 mg/dL — ABNORMAL HIGH (ref 70–99)

## 2014-09-11 MED ORDER — HYDROCODONE-ACETAMINOPHEN 5-325 MG PO TABS: 1.0000 | ORAL_TABLET | ORAL | Status: DC | PRN

## 2014-09-11 MED ORDER — SULFAMETHOXAZOLE-TRIMETHOPRIM 800-160 MG PO TABS: 2.0000 | ORAL_TABLET | Freq: Two times a day (BID) | ORAL | Status: DC

## 2014-09-11 MED ORDER — SULFAMETHOXAZOLE-TRIMETHOPRIM 800-160 MG PO TABS
2.0000 | ORAL_TABLET | Freq: Two times a day (BID) | ORAL | Status: DC
  Filled 2014-09-11: qty 2

## 2014-09-11 MED ORDER — INSULIN ASPART PROT & ASPART (70-30 MIX) 100 UNIT/ML ~~LOC~~ SUSP: 35.0000 [IU] | Freq: Two times a day (BID) | SUBCUTANEOUS | Status: DC

## 2014-09-11 NOTE — Discharge Summary (Signed)
Physician Discharge Summary  Todd Mccoy [id number] DOB: [birth year] DOA: 09/06/2014  PCP: No primary care provider on file.  Admit date: 09/06/2014 Discharge date: 09/11/2014  Recommendations for Outpatient Follow-up:  1. Pt will need to follow up with PCP in 2 weeks post discharge  Discharge Diagnoses:  Sepsis -resolved  -Present at the time of admission -Secondary to back abscess -Continue vancomycin while inpatient Right back/flank abscess/necrotizing fasciitis -back incision in ED but still significant induration and fluctuance -consulted surgery for possible further debridement--spoke with Dr. Zella Richer -09/07/14 debridement-->necrotizing fascia noted intraop -09/06/14 CT shows 4 cm area of air pocket in the right side of the back at the level of the liver with diffuse skin thickening -had hydrotherapy x 2 days after surgery in hospital -HIV antibody--neg -Intraoperative cultures=MRSA -continue vancomycin -plan to go home with Bactrim DS, TWO tabs bid x 10 days-- leaving 3/9 -Discontinue Zosyn -hydrotherapy per surgery--appreciate follow up -[facility] set out for wound care Diabetes mellitus type 2 -Previously taking 70/30 at home-only using sporadically due to financial issues -restart 70/30 while in hospital--increase to 35 units bid -check A1c-13.1 Tobacco Abuse -cessation discussed  Discharge Condition: stable  Disposition: home Follow-up Information    Follow up with ROSENBOWER,TODD J, MD. Schedule an appointment as soon as possible for a visit on 09/19/2014.   Specialty:  General Surgery   Why:  Continue wet to dry dressing at home twice a day.  You can shower and get soap and water into the wound. Redress after shower. Appt. scheduled for 09/19/14 @ 2:50pm. Please arrive @ 2:20pm.    Contact information:   [address] [place] [address] [phone]       Follow up with [place]     On 09/13/2014.   Why:  Walk  in 9a/$20 co pay/meds in bottle/photo id/discharge papers.   Contact information:   [address] [place] [phone]      Follow up with [facility]    .   Why:  Pharmacy-go directly @ discharge for meds.   Contact information:   [address] [place] [phone]      Diet:carb modified Wt Readings from Last 3 Encounters:  09/07/14 89.585 kg (197 lb 8 oz)  06/20/14 98.2 kg (216 lb 7.9 oz)    History of present illness:  44 year old male with a history of diabetes mellitus presents with one-week history of right flank/back pain. He noted some induration that began 1 week prior to this admission. It got worse over the period of this past week. He has some associated nausea and vomiting on the day prior to admission without any hematemesis or hematochezia. The patient was admitted to the hospital on 06/18/2014 where he had incision and drainage of scrotal abscess. The patient has not been on any recent antibiotics. CT of the abdomen and pelvis in the emergency department revealed a 4 cm air pocket with diffuse skin thickening on his right flank area at the level of the liver. Bedside drainage was performed in the emergency department. The patient was febrile with WBC 24.8.  Consultants: [facility]--[facility]  Discharge Exam: Filed Vitals:   09/11/14 1337  BP: 123/83  Pulse: 77  Temp: 98.3 F (36.8 C)  Resp: 16   Filed Vitals:   09/10/14 1400 09/10/14 2044 09/11/14 0520 09/11/14 1337  BP: 123/82 120/86 128/91 123/83  Pulse: 82 83 88 77  Temp:  24 F (36.7 C) 98.1 F (36.7 C) 98 F (36.7 C) 98.3 F (36.8 C)  TempSrc: Oral Oral Oral Oral  Resp: 16 18 18 16   Height:      Weight:      SpO2: 100% 100% 100% 100%   General: A&O x 3, NAD, pleasant, cooperative Cardiovascular: RRR, no rub, no gallop, no S3 Respiratory: CTAB, no wheeze, no rhonchi Abdomen:soft, nontender,  nondistended, positive bowel sounds  Extremities: No edema, No lymphangitis, no petechiae;right flank surgical incision with good granulation. Scattered areas of devitalized tissue. No pus. No crepitance  Discharge Instructions  Discharge Instructions    Diet - low sodium heart healthy    Complete by:  As directed      Increase activity slowly    Complete by:  As directed             Medication List    STOP taking these medications        doxycycline 100 MG tablet  Commonly known as:  VIBRA-TABS     insulin NPH-regular Human (70-30) 100 UNIT/ML injection  Commonly known as:  NOVOLIN 70/30      TAKE these medications        HYDROcodone-acetaminophen 5-325 MG per tablet  Commonly known as:  NORCO/VICODIN  Take 1-2 tablets by mouth every 4 (four) hours as needed for moderate pain.     insulin aspart protamine- aspart (70-30) 100 UNIT/ML injection  Commonly known as:  NOVOLOG MIX 70/30  Inject 0.35 mLs (35 Units total) into the skin 2 (two) times daily with a meal.     sulfamethoxazole-trimethoprim 800-160 MG per tablet  Commonly known as:  BACTRIM DS,SEPTRA DS  Take 2 tablets by mouth every 12 (twelve) hours.         The results of significant diagnostics from this hospitalization (including imaging, microbiology, ancillary and laboratory) are listed below for reference.    Significant Diagnostic Studies: Ct Abdomen Pelvis W Contrast  09/07/2014   CLINICAL DATA:  Acute onset of right lower back pain and swelling, with drainage of pus and blood. Fever and tachycardia. Tachypnea. Initial encounter.  EXAM: CT ABDOMEN AND PELVIS WITH CONTRAST  TECHNIQUE: Multidetector CT imaging of the abdomen and pelvis was performed using the standard protocol following bolus administration of intravenous contrast.  CONTRAST:  40mL OMNIPAQUE IOHEXOL 300 MG/ML SOLN, 1106mL OMNIPAQUE IOHEXOL 300 MG/ML SOLN  COMPARISON:  None.  FINDINGS: The visualized lung bases are clear.  There is diffuse  skin thickening along the upper back, compatible with diffuse cellulitis. Underlying soft tissue inflammation is most prominent on the right, with a 4.0 cm collection of air and increased density along the right side of the back at the level of the liver, likely reflecting packing material within a drained soft tissue abscess cavity. Soft tissue inflammation extends inferiorly along the back to the level of the iliac wings. No additional abscesses are identified. There is no evidence of intra-abdominal involvement.  The liver and spleen are unremarkable in appearance. The gallbladder is within normal limits. The pancreas and adrenal glands are unremarkable.  Mild nonspecific perinephric stranding is noted bilaterally. The kidneys are otherwise unremarkable. There is no evidence of hydronephrosis. No renal or ureteral stones are seen. No perinephric stranding is appreciated.  No free fluid is identified. The small bowel is unremarkable in appearance. The stomach is within normal limits. No acute vascular abnormalities are seen. Minimal calcification seen at the aortic bifurcation.  The appendix is normal in caliber,  without evidence for appendicitis. The colon is unremarkable in appearance.  The bladder is mildly distended and grossly unremarkable. The prostate is enlarged, measuring 6.4 cm in transverse dimension. No inguinal lymphadenopathy is seen.  No acute osseous abnormalities are identified.  IMPRESSION: 1. 4.0 cm collection of air and increased density along the right side of the back at the level of the liver likely reflects packing material within the drained soft tissue abscess cavity. Diffuse skin thickening noted along the upper back, compatible with diffuse cellulitis. Underlying soft tissue inflammation is more prominent on the right, and extends inferiorly along the back to the level of the iliac wings. No evidence of intra-abdominal involvement. 2. No additional abscesses seen. 3. Enlarged prostate  noted.   Electronically Signed   By: Garald Balding M.D.   On: 09/07/2014 01:38   Dg Chest Port 1 View  09/06/2014   CLINICAL DATA:  Abscess about lower right back.  Diabetes.  Smoker.  EXAM: PORTABLE CHEST - 1 VIEW  COMPARISON:  None.  FINDINGS: Midline trachea. Normal heart size. No pleural effusion or pneumothorax. There may be minimal right upper lobe scarring laterally.  IMPRESSION: No acute cardiopulmonary disease.   Electronically Signed   By: Abigail Miyamoto M.D.   On: 09/06/2014 23:24     Microbiology: Recent Results (from the past 240 hour(s))  Blood culture (routine x 2)     Status: None (Preliminary result)   Collection Time: 09/06/14 10:22 PM  Result Value Ref Range Status   Specimen Description BLOOD RIGHT HAND  Final   Special Requests BOTTLES DRAWN AEROBIC AND ANAEROBIC 5ML  Final   Culture   Final           BLOOD CULTURE RECEIVED NO GROWTH TO DATE CULTURE WILL BE HELD FOR 5 DAYS BEFORE ISSUING A FINAL NEGATIVE REPORT Performed at [facility]    Report Status PENDING  Incomplete  Blood culture (routine x 2)     Status: None (Preliminary result)   Collection Time: 09/06/14 10:22 PM  Result Value Ref Range Status   Specimen Description BLOOD BLOOD RIGHT FOREARM  Final   Special Requests BOTTLES DRAWN AEROBIC AND ANAEROBIC 5ML  Final   Culture   Final           BLOOD CULTURE RECEIVED NO GROWTH TO DATE CULTURE WILL BE HELD FOR 5 DAYS BEFORE ISSUING A FINAL NEGATIVE REPORT Performed at [facility]    Report Status PENDING  Incomplete  Urine culture     Status: None   Collection Time: 09/06/14 11:07 PM  Result Value Ref Range Status   Specimen Description URINE, RANDOM  Final   Special Requests NONE  Final   Colony Count   Final    30,000 COLONIES/ML Performed at [facility]    Culture   Final    Multiple bacterial morphotypes present, none predominant. Suggest appropriate recollection if clinically indicated. Performed at [facility]    Report Status 09/08/2014 FINAL  Final  Surgical pcr screen     Status: Abnormal   Collection Time: 09/07/14 11:25 AM  Result Value Ref Range Status   MRSA, PCR POSITIVE (A) NEGATIVE Final    Comment: CRITICAL RESULT CALLED TO, READ BACK BY AND VERIFIED WITH: SISON,G AT 12:50PM ON 09/07/14 BY FESTERMAN,C    Staphylococcus aureus POSITIVE (A) NEGATIVE Final    Comment:        The Xpert SA Assay (FDA approved for NASAL specimens in patients  over 87 years of age), is one component of a comprehensive surveillance program.  Test performance has been validated by [facility] for patients greater than or equal to 1 year old. It is not intended to diagnose infection nor to guide or monitor treatment.   Tissue culture     Status: None   Collection Time: 09/07/14  7:05 PM  Result Value Ref Range Status   Specimen Description ABSCESS RIGHT FLANK SKIN AND SUBCUTANEOUS TISSUE  Final   Special Requests PATIENT ON FOLLOWING ZOSYN AND VANCOMYCIN  Final   Gram Stain   Final    FEW WBC PRESENT, PREDOMINANTLY PMN NO SQUAMOUS EPITHELIAL CELLS SEEN MODERATE GRAM POSITIVE COCCI IN PAIRS Performed at [facility]    Culture   Final    ABUNDANT METHICILLIN RESISTANT STAPHYLOCOCCUS AUREUS Note: RIFAMPIN AND GENTAMICIN SHOULD NOT BE USED AS SINGLE DRUGS FOR TREATMENT OF STAPH INFECTIONS. CRITICAL RESULT CALLED TO, READ BACK BY AND VERIFIED WITH: H.HOLDERNESS 3/8 @ 1111 BY REAMM Performed at [facility]    Report Status 09/10/2014 FINAL  Final   Organism ID, Bacteria METHICILLIN RESISTANT STAPHYLOCOCCUS AUREUS  Final      Susceptibility   Methicillin resistant staphylococcus aureus - MIC*    CLINDAMYCIN <=0.25 SENSITIVE Sensitive     ERYTHROMYCIN 4 INTERMEDIATE Intermediate     GENTAMICIN <=0.5 SENSITIVE Sensitive     LEVOFLOXACIN 0.25 SENSITIVE Sensitive     OXACILLIN >=4 RESISTANT Resistant     PENICILLIN >=0.5 RESISTANT Resistant     RIFAMPIN <=0.5 SENSITIVE  Sensitive     TRIMETH/SULFA <=10 SENSITIVE Sensitive     VANCOMYCIN 1 SENSITIVE Sensitive     TETRACYCLINE >=16 RESISTANT Resistant     * ABUNDANT METHICILLIN RESISTANT STAPHYLOCOCCUS AUREUS     Labs: Basic Metabolic Panel:  Recent Labs Lab 09/06/14 1901 09/07/14 0345 09/08/14 0449 09/10/14 0457  NA 135 135 135 137  K 4.2 3.6 3.5 3.9  CL 97 101 99 101  CO2 24 23 27 27   GLUCOSE 314* 236* 267* 287*  BUN 11 10 9 9   CREATININE 0.85 0.67 0.83 0.75  CALCIUM 9.0 8.1* 8.2* 8.3*  MG  --  1.5  --   --   PHOS  --  2.4  --   --    Liver Function Tests:  Recent Labs Lab 09/07/14 0345  AST 13  ALT 15  ALKPHOS 110  BILITOT 1.1  PROT 6.4  ALBUMIN 2.5*   No results for input(s): LIPASE, AMYLASE in the last 168 hours. No results for input(s): AMMONIA in the last 168 hours. CBC:  Recent Labs Lab 09/06/14 1901 09/07/14 0345 09/08/14 0449 09/09/14 1139  WBC 24.8* 21.7* 14.2* 6.8  NEUTROABS 20.6*  --   --   --   HGB 12.8* 11.2* 11.1* 10.6*  HCT 38.9* 34.4* 34.3* 33.3*  MCV 85.1 84.7 84.7 85.4  PLT 373 297 382 380   Cardiac Enzymes: No results for input(s): CKTOTAL, CKMB, CKMBINDEX, TROPONINI in the last 168 hours. BNP: Invalid input(s): POCBNP CBG:  Recent Labs Lab 09/10/14 1152 09/10/14 1701 09/10/14 2115 09/11/14 0732 09/11/14 1140  GLUCAP 276* 141* 100* 229* 187*    Time coordinating discharge:  Greater than 30 minutes  Signed:  Tearah Saulsbury, DO [facility] Pager: [phone] 09/11/2014, 8:46 PM

## 2014-09-11 NOTE — Discharge Instructions (Signed)
Abscess An abscess is an infected area that contains a collection of pus and debris.It can occur in almost any part of the body. An abscess is also known as a furuncle or boil. CAUSES  An abscess occurs when tissue gets infected. This can occur from blockage of oil or sweat glands, infection of hair follicles, or a minor injury to the skin. As the body tries to fight the infection, pus collects in the area and creates pressure under the skin. This pressure causes pain. People with weakened immune systems have difficulty fighting infections and get certain abscesses more often.  SYMPTOMS Usually an abscess develops on the skin and becomes a painful mass that is red, warm, and tender. If the abscess forms under the skin, you may feel a moveable soft area under the skin. Some abscesses break open (rupture) on their own, but most will continue to get worse without care. The infection can spread deeper into the body and eventually into the bloodstream, causing you to feel ill.  DIAGNOSIS  Your caregiver will take your medical history and perform a physical exam. A sample of fluid may also be taken from the abscess to determine what is causing your infection. TREATMENT  Your caregiver may prescribe antibiotic medicines to fight the infection. However, taking antibiotics alone usually does not cure an abscess. Your caregiver may need to make a small cut (incision) in the abscess to drain the pus. In some cases, gauze is packed into the abscess to reduce pain and to continue draining the area. HOME CARE INSTRUCTIONS   Only take over-the-counter or prescription medicines for pain, discomfort, or fever as directed by your caregiver.  If you were prescribed antibiotics, take them as directed. Finish them even if you start to feel better.  If gauze is used, follow your caregiver's directions for changing the gauze.  To avoid spreading the infection:  Keep your draining abscess covered with a  bandage.  Wash your hands well.  Do not share personal care items, towels, or whirlpools with others.  Avoid skin contact with others.  Keep your skin and clothes clean around the abscess.  Keep all follow-up appointments as directed by your caregiver. SEEK MEDICAL CARE IF:   You have increased pain, swelling, redness, fluid drainage, or bleeding.  You have muscle aches, chills, or a general ill feeling.  You have a fever. MAKE SURE YOU:   Understand these instructions.  Will watch your condition.  Will get help right away if you are not doing well or get worse. Document Released: 03/31/2005 Document Revised: 12/21/2011 Document Reviewed: 09/03/2011 [facility] Patient Information 2015 [place], [place]. This information is not intended to replace advice given to you by your health care provider. Make sure you discuss any questions you have with your health care provider. Dressing Change A dressing is a material placed over wounds. It keeps the wound clean, dry, and protected from further injury.  BEFORE YOU BEGIN  Get your supplies together. Things you may need include:  Salt solution (saline).  Flexible gauze bandage.  Medicated cream.  Tape.  Gloves.  Belly (abdominal) pads.  Gauze squares.  Plastic bags.  Take pain medicine 30 minutes before the bandage change if you need it.  Take a shower before you do the first bandage change of the day. Put plastic wrap or a bag over the dressing. REMOVING YOUR OLD BANDAGE  Wash your hands with soap and water. Dry your hands with a clean towel.  Put on your  gloves.  Remove any tape.  Remove the old bandage as told. If it sticks, put a small amount of warm water on it to loosen the bandage.  Remove any gauze or packing tape in your wound.  Take off your gloves.  Put the gloves, tape, gauze, or any packing tape in a plastic bag. CHANGING YOUR BANDAGE  Open the supplies.  Take the cap off the salt  solution.  Open the gauze. Leave the gauze on the inside of the package.  Put on your gloves.  Clean your wound as told by your doctor.  Keep your wound dry if your doctor told you to do so.  Your doctor may tell you to do one or more of the following:  Pick up the gauze. Pour the salt solution over the gauze. Squeeze out the extra salt solution.  Put medicated cream or other medicine on your wound.  Put solution soaked gauze only in your wound, not on the skin around it.  Pack your wound loosely.  Put dry gauze on your wound.  Put belly pads over the dry gauze if your bandages soak through.  Tape the bandage in place so it will not fall off. Do not wrap the tape all the way around your arm or leg.  Wrap the bandage with the flexible gauze bandage as told by your doctor.  Take off your gloves. Put them in the plastic bag with the old bandage. Tie the bag shut and throw it away.  Keep the bandage clean and dry.  Wash your hands. GET HELP RIGHT AWAY IF:   Your skin around the wound looks red.  Your wound feels more tender or sore.  You see yellowish-white fluid (pus) in the wound.  Your wound smells bad.  You have a fever.  Your skin around the wound has a red rash that itches and burns.  You see black or yellow skin in your wound that was not there before.  You feel sick to your stomach (nauseous), throw up (vomit), and feel very tired. Document Released: 09/17/2008 Document Revised: 11/05/2013 Document Reviewed: 05/02/2011 [facility] Patient Information 2015 [place], [place]. This information is not intended to replace advice given to you by your health care provider. Make sure you discuss any questions you have with your health care provider.  Dressing Change A dressing is a material placed over wounds. It keeps the wound clean, dry, and protected from further injury. This provides an environment that favors wound healing.  BEFORE YOU BEGIN  Get your supplies  together. Things you may need include:  Saline solution.  Flexible gauze dressing.  Medicated cream.  Tape.  Gloves.  Abdominal dressing pads.  Gauze squares.  Plastic bags.  Take pain medicine 30 minutes before the dressing change if you need it.  Take a shower before you do the first dressing change of the day. Use plastic wrap or a plastic bag to prevent the dressing from getting wet. REMOVING YOUR OLD DRESSING   Wash your hands with soap and water. Dry your hands with a clean towel.  Put on your gloves.  Remove any tape.  Carefully remove the old dressing. If the dressing sticks, you may dampen it with warm water to loosen it, or follow your caregiver's specific directions.  Remove any gauze or packing tape that is in your wound.  Take off your gloves.  Put the gloves, tape, gauze, or any packing tape into a plastic bag. CHANGING YOUR DRESSING  Open the  supplies.  Take the cap off the saline solution.  Open the gauze package so that the gauze remains on the inside of the package.  Put on your gloves.  Clean your wound as told by your caregiver.  If you have been told to keep your wound dry, follow those instructions.  Your caregiver may tell you to do one or more of the following:  Pick up the gauze. Pour the saline solution over the gauze. Squeeze out the extra saline solution.  Put medicated cream or other medicine on your wound if you have been told to do so.  Put the solution soaked gauze only in your wound, not on the skin around it.  Pack your wound loosely or as told by your caregiver.  Put dry gauze on your wound.  Put abdominal dressing pads over the dry gauze if your wet gauze soaks through.  Tape the abdominal dressing pads in place so they will not fall off. Do not wrap the tape completely around the affected part (arm, leg, abdomen).  Wrap the dressing pads with a flexible gauze dressing to secure it in place.  Take off your gloves.  Put them in the plastic bag with the old dressing. Tie the bag shut and throw it away.  Keep the dressing clean and dry until your next dressing change.  Wash your hands. SEEK MEDICAL CARE IF:  Your skin around the wound looks red.  Your wound feels more tender or sore.  You see pus in the wound.  Your wound smells bad.  You have a fever.  Your skin around the wound has a rash that itches and burns.  You see black or yellow skin in your wound that was not there before.  You feel nauseous, throw up, and feel very tired. Document Released: 07/29/2004 Document Revised: 09/13/2011 Document Reviewed: 05/03/2011 [facility] Patient Information 2015 [place], [place]. This information is not intended to replace advice given to you by your health care provider. Make sure you discuss any questions you have with your health care provider.

## 2014-09-11 NOTE — Progress Notes (Signed)
   09/11/14 1100  Subjective Assessment  Patient and Family Stated Goals wound healed/get rid of infection  Date of Onset 09/09/14  Prior Treatments surgical I and D  Evaluation and Treatment  Evaluation and Treatment Procedures Explained to Patient/Family Yes  Evaluation and Treatment Procedures agreed to  Wound / Incision (Open or Dehisced) 09/09/14 Incision - Open Rib Right I and D abscess right flank  Date First Assessed/Time First Assessed: 09/09/14 1311   Wound Type: Incision - Open  Location: Rib  Location Orientation: Right  Wound Description (Comments): I and D abscess right flank  Present on Admission: (c)   Dressing Type Moist to dry  Dressing Change Frequency Daily  Site / Wound Assessment Painful;Yellow;Red  % Wound base Red or Granulating 80%  % Wound base Yellow 20%  Peri-wound Assessment Intact (lessening induration)  Wound Length (cm) (see eval)  Closure None  Drainage Amount Minimal  Drainage Description Serous;No odor  Non-staged Wound Description Not applicable  Treatment Cleansed;Debridement (Selective);Hydrotherapy (Pulse lavage);Packing (Saline gauze)  Hydrotherapy  Pulsed Lavage with Suction (psi) 4 psi  Pulsed Lavage with Suction - Normal Saline Used 1000 mL  Pulsed Lavage Tip Tip with splash shield  Pulsed lavage therapy - wound location right flank  Selective Debridement  Selective Debridement - Location right flank  Selective Debridement - Tools Used Forceps;Scissors  Selective Debridement - Tissue Removed slough  Wound Therapy - Assess/Plan/Recommendations  Wound Therapy - Clinical Statement pt will benefit from PT/hydrotherapy to address wound healing through cleansing, debridement and education  Wound Therapy - Functional Problem List painful mobility  Factors Delaying/Impairing Wound Healing Diabetes Mellitus;Tobacco use  Hydrotherapy Plan Debridement;Dressing change;Patient/family education;Pulsatile lavage with suction  Wound Therapy - Frequency  6X / week  Wound Therapy - Follow Up Recommendations Home health RN  Wound Plan will continue to follow for cleansing, debridement, education  Wound Therapy Goals - Improve the function of patient's integumentary system by progressing the wound(s) through the phases of wound healing by:  Decrease Necrotic Tissue to 20  Decrease Necrotic Tissue - Progress Met  Increase Granulation Tissue to 80  Increase Granulation Tissue - Progress Met  Goals/treatment plan/discharge plan were made with and agreed upon by patient/family Yes (goals met, pt to D/C today)  Time For Goal Achievement 2 weeks  Wound Therapy - Potential for Goals Good

## 2014-09-11 NOTE — Progress Notes (Signed)
4 Days Post-Op  Subjective: Pt doing well today.  No complaints  Objective: Vital signs in last 24 hours: Temp:  [98 F (36.7 C)-98.1 F (36.7 C)] 98 F (36.7 C) (03/09 0520) Pulse Rate:  [82-88] 88 (03/09 0520) Resp:  [15-18] 18 (03/09 0520) BP: (104-128)/(71-91) 128/91 mmHg (03/09 0520) SpO2:  [100 %] 100 % (03/09 0520) Last BM Date: 09/10/14  Receiving hydrotherapy for open wound. Intake/Output from previous day: 03/08 0701 - 03/09 0700 In: 750 [IV Piggyback:750] Out: 1250 [Urine:1250] Intake/Output this shift: Total I/O In: 240 [P.O.:240] Out: -   PE: Back: Wound is cleaning up.  Still with some fibrin, but overall fairly clean  Lab Results:   Recent Labs  09/09/14 1139  WBC 6.8  HGB 10.6*  HCT 33.3*  PLT 380    BMET  Recent Labs  09/10/14 0457  NA 137  K 3.9  CL 101  CO2 27  GLUCOSE 287*  BUN 9  CREATININE 0.75  CALCIUM 8.3*   PT/INR No results for input(s): LABPROT, INR in the last 72 hours.   Recent Labs Lab 09/07/14 0345  AST 13  ALT 15  ALKPHOS 110  BILITOT 1.1  PROT 6.4  ALBUMIN 2.5*     Lipase  No results found for: LIPASE   Studies/Results: No results found.  Medications: . docusate sodium  100 mg Oral BID  . enoxaparin (LOVENOX) injection  40 mg Subcutaneous Q24H  . insulin aspart  0-15 Units Subcutaneous TID WC  . insulin aspart  0-5 Units Subcutaneous QHS  . insulin aspart protamine- aspart  35 Units Subcutaneous BID WC  . mupirocin ointment  1 application Nasal BID  . sodium chloride  3 mL Intravenous Q12H  . vancomycin  1,250 mg Intravenous 3 times per day    Assessment/Plan 1. INCISION AND DRAINAGE ABSCESS RIGHT FLANK - POD 4 - [place] -Zosyn/Vanc, can likely switch to oral abx therapy at this point. CX reveals MRSA. Would treat with either doxy or bactrum for a total of 7 days -10 x 5 cm wound - hydrotherapy today and then patient can be discharged home from our standpoint after hydro today.   [facility] RN  order for wound care assistance -follow up with Dr. Zella Richer in 2 weeks.  2. DM - per medicine 3. Smokes 4. DVT prophylaxis - Lovenox  LOS: 4 days   LOS: 5 days    Hill Mackie E 10:02 AM  09/11/2014 Pager [phone]

## 2014-09-13 ENCOUNTER — Ambulatory Visit: Payer: Self-pay | Attending: Internal Medicine | Admitting: Family Medicine

## 2014-09-13 ENCOUNTER — Inpatient Hospital Stay: Payer: Self-pay

## 2014-09-13 ENCOUNTER — Ambulatory Visit: Payer: Self-pay

## 2014-09-13 VITALS — BP 120/81 | HR 102 | Temp 98.4°F | Resp 16 | Ht 70.0 in | Wt 204.0 lb

## 2014-09-13 DIAGNOSIS — Z794 Long term (current) use of insulin: Secondary | ICD-10-CM | POA: Insufficient documentation

## 2014-09-13 DIAGNOSIS — E119 Type 2 diabetes mellitus without complications: Secondary | ICD-10-CM | POA: Insufficient documentation

## 2014-09-13 DIAGNOSIS — M726 Necrotizing fasciitis: Secondary | ICD-10-CM | POA: Insufficient documentation

## 2014-09-13 LAB — CULTURE, BLOOD (ROUTINE X 2)
CULTURE: NO GROWTH
Culture: NO GROWTH

## 2014-09-13 LAB — GLUCOSE, POCT (MANUAL RESULT ENTRY): POC Glucose: 288 mg/dl — AB (ref 70–99)

## 2014-09-13 LAB — POCT GLYCOSYLATED HEMOGLOBIN (HGB A1C): Hemoglobin A1C: 13.4

## 2014-09-13 MED ORDER — INSULIN ASPART 100 UNIT/ML ~~LOC~~ SOLN: 35.0000 [IU] | Freq: Three times a day (TID) | SUBCUTANEOUS | Status: DC

## 2014-09-13 MED ORDER — INSULIN ASPART PROT & ASPART (70-30 MIX) 100 UNIT/ML ~~LOC~~ SUSP: 35.0000 [IU] | Freq: Two times a day (BID) | SUBCUTANEOUS | Status: DC

## 2014-09-13 MED ORDER — INSULIN ASPART PROT & ASPART (70-30 MIX) 100 UNIT/ML ~~LOC~~ SUSP: 35.0000 [IU] | Freq: Once | SUBCUTANEOUS | Status: DC

## 2014-09-13 MED ORDER — INSULIN ASPART 100 UNIT/ML ~~LOC~~ SOLN
10.0000 [IU] | Freq: Once | SUBCUTANEOUS | Status: AC
  Administered 2014-09-13: 10 [IU] via SUBCUTANEOUS

## 2014-09-13 NOTE — Progress Notes (Signed)
Patient here to establish care after recent discharge from [facility] for necrotizing fascitis to his right flank.  Patient states wound Rn comes to his home to change His dressings twice a week.  Patient reports no pain today and says that he has been taking all medications prescribed to him upon discharge.

## 2014-09-13 NOTE — Progress Notes (Signed)
Subjective:     Patient ID: Todd Mccoy, male   DOB: 1971/05/13, 44 y.o.   MRN: [id number]  HPI  This patient presents today for a follow-up visit for an abscess on his back with resultant Necrotizing Fascitis.  He was admitted from 3/4-3/9. For drainage and debridement.  He has a history of type II diabetes, scrotal abscess,sepsis, tobacco abuse. He symptoms started with pain an swelling in his back about 2 weeks prior to going to hospital.   Review of Systems  General:  He denies fever, chills, loss of appetite, pain except for at site of wound Chest: He denies chest pain or dyspnea Abd:  He denies abd pain, nausea/vomiting;diarrhea     Objective:   Physical Exam   General:  Warm and dry, alert, oriented, appropriate, in no distress. HEENT:  Normocephalic, atraumatic. Lungs:  Clear to auscultation Heart:   Regular rate, w/o m,g,r. Skin:    Large cavernous lesion on left flank.  Approximately  3x4 inches wide.     Assessment:    !. Necrotizing Fascitis   2. Diabetes Type II     Plan:   Necrotizing Fascitis:  Follow-up with surgeon as planned Diabetes:  Continue Novolog 70/30 at 35 units BID, follow-up in one month with record of BS. 10 units Novolog given here today per protocol.

## 2014-09-13 NOTE — Patient Instructions (Addendum)
Follow-up with surgeon as planned. Follow his instructions for further care. Take 35 units of insulin BID  Follow-up in one month and bring BS reading with you.

## 2014-09-16 ENCOUNTER — Ambulatory Visit: Payer: Self-pay | Attending: Internal Medicine

## 2014-10-14 ENCOUNTER — Other Ambulatory Visit: Payer: Self-pay

## 2014-10-14 MED ORDER — INSULIN ASPART PROT & ASPART (70-30 MIX) 100 UNIT/ML ~~LOC~~ SUSP: 35.0000 [IU] | Freq: Two times a day (BID) | SUBCUTANEOUS | Status: DC

## 2016-04-08 ENCOUNTER — Encounter (HOSPITAL_COMMUNITY): Payer: Self-pay | Admitting: Emergency Medicine

## 2016-04-08 ENCOUNTER — Emergency Department (HOSPITAL_COMMUNITY)
Admission: EM | Admit: 2016-04-08 | Discharge: 2016-04-08 | Disposition: A | Discharge status: Home / Self Care | Payer: Self-pay | Attending: Emergency Medicine | Admitting: Emergency Medicine

## 2016-04-08 DIAGNOSIS — L0291 Cutaneous abscess, unspecified: Secondary | ICD-10-CM

## 2016-04-08 DIAGNOSIS — Z794 Long term (current) use of insulin: Secondary | ICD-10-CM | POA: Insufficient documentation

## 2016-04-08 DIAGNOSIS — F17210 Nicotine dependence, cigarettes, uncomplicated: Secondary | ICD-10-CM | POA: Insufficient documentation

## 2016-04-08 DIAGNOSIS — L02811 Cutaneous abscess of head [any part, except face]: Secondary | ICD-10-CM | POA: Insufficient documentation

## 2016-04-08 DIAGNOSIS — R739 Hyperglycemia, unspecified: Secondary | ICD-10-CM

## 2016-04-08 DIAGNOSIS — E1165 Type 2 diabetes mellitus with hyperglycemia: Secondary | ICD-10-CM | POA: Insufficient documentation

## 2016-04-08 LAB — CBC WITH DIFFERENTIAL/PLATELET
Basophils Absolute: 0 10*3/uL (ref 0.0–0.1)
Basophils Relative: 0 %
EOS PCT: 2 %
Eosinophils Absolute: 0.3 10*3/uL (ref 0.0–0.7)
HCT: 39.9 % (ref 39.0–52.0)
HEMOGLOBIN: 13.7 g/dL (ref 13.0–17.0)
LYMPHS ABS: 2.3 10*3/uL (ref 0.7–4.0)
LYMPHS PCT: 17 %
MCH: 29.5 pg (ref 26.0–34.0)
MCHC: 34.3 g/dL (ref 30.0–36.0)
MCV: 86 fL (ref 78.0–100.0)
Monocytes Absolute: 0.8 10*3/uL (ref 0.1–1.0)
Monocytes Relative: 6 %
Neutro Abs: 10.1 10*3/uL — ABNORMAL HIGH (ref 1.7–7.7)
Neutrophils Relative %: 75 %
Platelets: 349 10*3/uL (ref 150–400)
RBC: 4.64 MIL/uL (ref 4.22–5.81)
RDW: 12.4 % (ref 11.5–15.5)
WBC: 13.5 10*3/uL — AB (ref 4.0–10.5)

## 2016-04-08 LAB — BASIC METABOLIC PANEL
Anion gap: 10 (ref 5–15)
BUN: 7 mg/dL (ref 6–20)
CHLORIDE: 99 mmol/L — AB (ref 101–111)
CO2: 24 mmol/L (ref 22–32)
Calcium: 8.9 mg/dL (ref 8.9–10.3)
Creatinine, Ser: 0.65 mg/dL (ref 0.61–1.24)
GFR calc Af Amer: >60 mL/min (ref >60)
GFR calc non Af Amer: >60 mL/min (ref >60)
GLUCOSE: 460 mg/dL — AB (ref 65–99)
POTASSIUM: 3.7 mmol/L (ref 3.5–5.1)
Sodium: 133 mmol/L — ABNORMAL LOW (ref 135–145)

## 2016-04-08 LAB — I-STAT CG4 LACTIC ACID, ED: Lactic Acid, Venous: 1.01 mmol/L (ref 0.5–1.9)

## 2016-04-08 LAB — CBG MONITORING, ED
GLUCOSE-CAPILLARY: 229 mg/dL — AB (ref 65–99)
Glucose-Capillary: 437 mg/dL — ABNORMAL HIGH (ref 65–99)

## 2016-04-08 MED ORDER — SODIUM CHLORIDE 0.9 % IV BOLUS (SEPSIS)
1000.0000 mL | Freq: Once | INTRAVENOUS | Status: AC
  Administered 2016-04-08: 1000 mL via INTRAVENOUS

## 2016-04-08 MED ORDER — SULFAMETHOXAZOLE-TRIMETHOPRIM 800-160 MG PO TABS
2.0000 | ORAL_TABLET | Freq: Once | ORAL | Status: AC
  Administered 2016-04-08: 2 via ORAL
  Filled 2016-04-08: qty 2

## 2016-04-08 MED ORDER — DOXYCYCLINE HYCLATE 100 MG PO CAPS: 100.0000 mg | ORAL_CAPSULE | Freq: Two times a day (BID) | ORAL | 0 refills | Status: DC

## 2016-04-08 MED ORDER — INSULIN ASPART 100 UNIT/ML ~~LOC~~ SOLN
10.0000 [IU] | Freq: Once | SUBCUTANEOUS | Status: AC
  Administered 2016-04-08: 10 [IU] via INTRAVENOUS
  Filled 2016-04-08: qty 1

## 2016-04-08 MED FILL — DOXYCYCLINE 100 MG TABLET: 100 | 7 days supply | Qty: 14 | Fill #0

## 2016-04-08 NOTE — ED Triage Notes (Signed)
Pt states he has an abscess on his head and now it is very painful and feels like he is having some facial swelling

## 2016-04-08 NOTE — ED Provider Notes (Signed)
[facility] DEPT Provider Note   CSN: [id number] Arrival date & time: 04/08/16  0415     History   Chief Complaint Chief Complaint  Patient presents with  . Abscess  . Hyperglycemia    HPI Todd Mccoy is a 45 y.o. male.  Presents to the ER for evaluation of multiple abscesses on his scalp. Patient reports that he has a history of chronic recurrent abscesses. Patient has multiple areas that are draining currently. He has not had any fever.      Past Medical History:  Diagnosis Date  . Diabetes mellitus without complication [facility])     Patient Active Problem List   Diagnosis Date Noted  . Necrotizing fasciitis ([place]) 09/08/2014  . Diabetes mellitus with hyperglycemia ([place]) 09/07/2014  . Tobacco abuse 09/07/2014  . Abscess of back   . Abscess of lower back 09/06/2014  . DM2 (diabetes mellitus, type 2) ([place]) 09/06/2014  . Sepsis ([place]) 09/06/2014  . Scrotal abscess 06/20/2014    Past Surgical History:  Procedure Laterality Date  . INCISION AND DRAINAGE ABSCESS Right 09/07/2014   Procedure: INCISION AND DRAINAGE ABSCESS RIGHT FLANK;  Surgeon: Jackolyn Confer, MD;  Location: [facility]S;  Service: General;  Laterality: Right;  . SCROTUM EXPLORATION         Home Medications    Prior to Admission medications   Medication Sig Start Date End Date Taking? Authorizing Provider  HYDROcodone-acetaminophen (NORCO/VICODIN) 5-325 MG per tablet Take 1-2 tablets by mouth every 4 (four) hours as needed for moderate pain. 09/11/14   Orson Eva, MD  insulin aspart protamine- aspart (NOVOLOG MIX 70/30) (70-30) 100 UNIT/ML injection Inject 0.35 mLs (35 Units total) into the skin 2 (two) times daily. 10/14/14   Lorayne Marek, MD  sulfamethoxazole-trimethoprim (BACTRIM DS,SEPTRA DS) 800-160 MG per tablet Take 2 tablets by mouth every 12 (twelve) hours. 09/11/14   Orson Eva, MD    Family History Family History  Problem Relation Age of Onset  . Diabetes Mother   . Diabetes Father   .  Diabetes Brother     Social History Social History  Substance Use Topics  . Smoking status: Current Every Day Smoker    Packs/day: 0.50    Types: Cigarettes  . Smokeless tobacco: Never Used  . Alcohol use No     Allergies   Review of patient's allergies indicates no known allergies.   Review of Systems Review of Systems  Skin: Positive for wound.  All other systems reviewed and are negative.    Physical Exam Updated Vital Signs BP 134/90 (BP Location: Right Arm)   Pulse 109   Temp 98.6 F (37 C) (Oral)   SpO2 100%   Physical Exam  Constitutional: He is oriented to person, place, and time. He appears well-developed and well-nourished. No distress.  HENT:  Head: Normocephalic and atraumatic.  Right Ear: Hearing normal.  Left Ear: Hearing normal.  Nose: Nose normal.  Mouth/Throat: Oropharynx is clear and moist and mucous membranes are normal.  Eyes: Conjunctivae and EOM are normal. Pupils are equal, round, and reactive to light.  Neck: Normal range of motion. Neck supple.  Cardiovascular: Regular rhythm, S1 normal and S2 normal.  Exam reveals no gallop and no friction rub.   No murmur heard. Pulmonary/Chest: Effort normal and breath sounds normal. No respiratory distress. He exhibits no tenderness.  Abdominal: Soft. Normal appearance and bowel sounds are normal. There is no hepatosplenomegaly. There is no tenderness. There is no rebound, no guarding, no tenderness at  McBurney's point and negative Murphy's sign. No hernia.  Musculoskeletal: Normal range of motion.  Neurological: He is alert and oriented to person, place, and time. He has normal strength. No cranial nerve deficit or sensory deficit. Coordination normal. GCS eye subscore is 4. GCS verbal subscore is 5. GCS motor subscore is 6.  Skin: Skin is warm, dry and intact. No rash noted. No cyanosis.  2 open, draining, slightly indurated areas on the left side of the scalp  Psychiatric: He has a normal mood and  affect. His speech is normal and behavior is normal. Thought content normal.  Nursing note and vitals reviewed.    ED Treatments / Results  Labs (all labs ordered are listed, but only abnormal results are displayed) Labs Reviewed  CBG MONITORING, ED - Abnormal; Notable for the following:       Result Value   Glucose-Capillary 437 (*)    All other components within normal limits  AEROBIC CULTURE (SUPERFICIAL SPECIMEN)  CBC WITH DIFFERENTIAL/PLATELET  BASIC METABOLIC PANEL  I-STAT CG4 LACTIC ACID, ED    EKG  EKG Interpretation None       Radiology No results found.  Procedures Procedures (including critical care time)  Medications Ordered in ED Medications  sodium chloride 0.9 % bolus 1,000 mL (not administered)  insulin aspart (novoLOG) injection 10 Units (not administered)  sulfamethoxazole-trimethoprim (BACTRIM DS,SEPTRA DS) 800-160 MG per tablet 2 tablet (2 tablets Oral Given 04/08/16 0530)     Initial Impression / Assessment and Plan / ED Course  I have reviewed the triage vital signs and the nursing notes.  Pertinent labs & imaging results that were available during my care of the patient were reviewed by me and considered in my medical decision making (see chart for details).  Clinical Course   Patient presented to the ER for evaluation of scalp abscess. Patient has history of multiple recurrent abscesses in the past. He does have history of necrotizing fasciitis, this does not seem consistent with recurrence. He does not appear septic on initial evaluation. Patient appears well. His blood sugar, however, is 437. Patient will receive IV fluids and insulin to correct his glucose. Check labs for acidosis and lactate. Wound culture obtained.  Patient will be signed out to oncoming ER physician to follow labs and glucose. Anticipate discharge if no sepsis/ketosis.  Final Clinical Impressions(s) / ED Diagnoses   Final diagnoses:  Abscess  Hyperglycemia    New  Prescriptions New Prescriptions   No medications on file     Orpah Greek, MD 04/08/16 [phone]

## 2016-04-08 NOTE — ED Notes (Signed)
Pt stated he did not take his insulin last night because he was in too much pain he did not think about it.

## 2016-04-08 NOTE — ED Provider Notes (Signed)
Signed out by Dr Betsey Holiday to d/c to home when blood sugar improved.  After 2 liters ns, glucose in 200s.  Pt tolerating po. No nv.   Patient currently appears stable for d/c.      Lajean Saver, MD 04/08/16 [phone]

## 2016-04-08 NOTE — Discharge Instructions (Signed)
It was our pleasure to provide your ER care today - we hope that you feel better.  Rest. Drink plenty of fluids.  Continue insulin and monitor blood sugars closely and record values - follow up with your doctor in the coming week.  Warm compresses to sore areas. Keep areas very clean.   Take antibiotic as prescribed.   Take tylenol/advil as need for pain.  Return to ER if worse, spreading redness, increased swelling, fevers, other concern.

## 2016-04-10 LAB — AEROBIC CULTURE  (SUPERFICIAL SPECIMEN)

## 2016-04-10 LAB — AEROBIC CULTURE W GRAM STAIN (SUPERFICIAL SPECIMEN)

## 2016-04-11 ENCOUNTER — Telehealth (HOSPITAL_BASED_OUTPATIENT_CLINIC_OR_DEPARTMENT_OTHER): Payer: Self-pay

## 2016-04-11 NOTE — Telephone Encounter (Signed)
Post ED Visit - Positive Culture Follow-up  Culture report reviewed by antimicrobial stewardship pharmacist:  []  Elenor Quinones, Pharm.D. []  Heide Guile, Pharm.D., BCPS [x]  Parks Neptune, Pharm.D. []  Alycia Rossetti, Pharm.D., BCPS []  Edgar, Pharm.D., BCPS, AAHIVP []  Legrand Como, Pharm.D., BCPS, AAHIVP []  Milus Glazier, Pharm.D. []  Stephens November, Pharm.D.  Positive urine culture Treated with Doxyclycline Hyclate, organism sensitive to the same and no further patient follow-up is required at this time.  Genia Del 04/11/2016, 9:23 AM

## 2019-11-07 ENCOUNTER — Encounter (HOSPITAL_COMMUNITY): Payer: Self-pay

## 2019-11-07 ENCOUNTER — Emergency Department (HOSPITAL_COMMUNITY)
Admission: EM | Admit: 2019-11-07 | Discharge: 2019-11-07 | Disposition: A | Discharge status: Home / Self Care | Payer: Self-pay | Attending: Emergency Medicine | Admitting: Emergency Medicine

## 2019-11-07 ENCOUNTER — Emergency Department (HOSPITAL_COMMUNITY): Payer: Self-pay

## 2019-11-07 ENCOUNTER — Other Ambulatory Visit: Payer: Self-pay

## 2019-11-07 DIAGNOSIS — Y9289 Other specified places as the place of occurrence of the external cause: Secondary | ICD-10-CM | POA: Insufficient documentation

## 2019-11-07 DIAGNOSIS — Y9389 Activity, other specified: Secondary | ICD-10-CM | POA: Insufficient documentation

## 2019-11-07 DIAGNOSIS — S81801A Unspecified open wound, right lower leg, initial encounter: Secondary | ICD-10-CM | POA: Insufficient documentation

## 2019-11-07 DIAGNOSIS — L03115 Cellulitis of right lower limb: Secondary | ICD-10-CM | POA: Insufficient documentation

## 2019-11-07 DIAGNOSIS — Z794 Long term (current) use of insulin: Secondary | ICD-10-CM | POA: Insufficient documentation

## 2019-11-07 DIAGNOSIS — Y999 Unspecified external cause status: Secondary | ICD-10-CM | POA: Insufficient documentation

## 2019-11-07 DIAGNOSIS — F17210 Nicotine dependence, cigarettes, uncomplicated: Secondary | ICD-10-CM | POA: Insufficient documentation

## 2019-11-07 DIAGNOSIS — L03116 Cellulitis of left lower limb: Secondary | ICD-10-CM | POA: Insufficient documentation

## 2019-11-07 DIAGNOSIS — S81802A Unspecified open wound, left lower leg, initial encounter: Secondary | ICD-10-CM | POA: Insufficient documentation

## 2019-11-07 DIAGNOSIS — X58XXXA Exposure to other specified factors, initial encounter: Secondary | ICD-10-CM | POA: Insufficient documentation

## 2019-11-07 DIAGNOSIS — E119 Type 2 diabetes mellitus without complications: Secondary | ICD-10-CM | POA: Insufficient documentation

## 2019-11-07 LAB — BASIC METABOLIC PANEL
Anion gap: 9 (ref 5–15)
BUN: 13 mg/dL (ref 6–20)
CO2: 25 mmol/L (ref 22–32)
Calcium: 7.9 mg/dL — ABNORMAL LOW (ref 8.9–10.3)
Chloride: 104 mmol/L (ref 98–111)
Creatinine, Ser: 1.16 mg/dL (ref 0.61–1.24)
GFR calc Af Amer: >60 mL/min (ref >60)
GFR calc non Af Amer: >60 mL/min (ref >60)
Glucose, Bld: 357 mg/dL — ABNORMAL HIGH (ref 70–99)
Potassium: 4.5 mmol/L (ref 3.5–5.1)
Sodium: 138 mmol/L (ref 135–145)

## 2019-11-07 LAB — CBC WITH DIFFERENTIAL/PLATELET
Abs Immature Granulocytes: 0.06 10*3/uL (ref 0.00–0.07)
Basophils Absolute: 0 10*3/uL (ref 0.0–0.1)
Basophils Relative: 0 %
Eosinophils Absolute: 0.1 10*3/uL (ref 0.0–0.5)
Eosinophils Relative: 1 %
HCT: 31.3 % — ABNORMAL LOW (ref 39.0–52.0)
Hemoglobin: 9.7 g/dL — ABNORMAL LOW (ref 13.0–17.0)
Immature Granulocytes: 1 %
Lymphocytes Relative: 35 %
Lymphs Abs: 3 10*3/uL (ref 0.7–4.0)
MCH: 27.1 pg (ref 26.0–34.0)
MCHC: 31 g/dL (ref 30.0–36.0)
MCV: 87.4 fL (ref 80.0–100.0)
Monocytes Absolute: 0.4 10*3/uL (ref 0.1–1.0)
Monocytes Relative: 5 %
Neutro Abs: 4.9 10*3/uL (ref 1.7–7.7)
Neutrophils Relative %: 58 %
Platelets: 316 10*3/uL (ref 150–400)
RBC: 3.58 MIL/uL — ABNORMAL LOW (ref 4.22–5.81)
RDW: 14.2 % (ref 11.5–15.5)
WBC: 8.4 10*3/uL (ref 4.0–10.5)
nRBC: 0 % (ref 0.0–0.2)

## 2019-11-07 MED ORDER — DOXYCYCLINE HYCLATE 100 MG PO CAPS: 100.0000 mg | ORAL_CAPSULE | Freq: Two times a day (BID) | ORAL | 0 refills | Status: AC

## 2019-11-07 MED ORDER — DOXYCYCLINE HYCLATE 100 MG PO TABS
100.0000 mg | ORAL_TABLET | Freq: Once | ORAL | Status: AC
  Administered 2019-11-07: 19:00:00 100 mg via ORAL
  Filled 2019-11-07: qty 1

## 2019-11-07 NOTE — Discharge Instructions (Signed)
Keep the wounds clean and dry.  You can gently wash them with soap and water.  Make sure you pat them dry before you apply any bandage.  Follow-up with the wound care center for further evaluation and treatment.  Follow-up with [facility] to establish a primary care doctor if you do not have one.   Take antibiotics as directed. Please take all of your antibiotics until finished.  Return to the emergency department for any fever, worsening spreading of the wounds, redness or swelling of the leg that begins to spread, worsening pain or any other worsening or concerning symptoms.

## 2019-11-07 NOTE — ED Triage Notes (Addendum)
Patient reports 2 abscesses on right leg with hx of abcesses.   1 abscess on front of right shin and 2nd on back of right calf.   7/10 pain  Ambulatory in triage

## 2019-11-07 NOTE — ED Provider Notes (Signed)
[facility] DEPT Provider Note   CSN: [id number] Arrival date & time: 11/07/19  1430     History Chief Complaint  Patient presents with  . Abscess    Todd Mccoy is a 49 y.o. male past history is of diabetes, necrotizing fasciitis who presents for evaluation of right lower extremity pain, swelling.  He states about 2 and half weeks ago, he noticed that he had some sores and areas of itching on his right lower extremity.  Denies any preceding trauma, injury.  He states that since then, he has had a few areas that have opened up almost like an abscess and have started draining.  He also reports that his legs are more swollen and more painful.  He states he has had abscesses in the past and this feels similar.  He does have a history of diabetes and states he takes insulin.  He has been monitoring his blood sugars and states they have been around 180.  He has not had any fevers.  He denies any chest pain, difficulty breathing, abdominal pain, nausea/vomiting.  He does have some baseline neuropathy and has existing numbness to his feet.  The history is provided by the patient.       Past Medical History:  Diagnosis Date  . Diabetes mellitus without complication [facility])     Patient Active Problem List   Diagnosis Date Noted  . Necrotizing fasciitis ([place]) 09/08/2014  . Diabetes mellitus with hyperglycemia ([place]) 09/07/2014  . Tobacco abuse 09/07/2014  . Abscess of back   . Abscess of lower back 09/06/2014  . DM2 (diabetes mellitus, type 2) ([place]) 09/06/2014  . Sepsis ([place]) 09/06/2014  . Scrotal abscess 06/20/2014    Past Surgical History:  Procedure Laterality Date  . INCISION AND DRAINAGE ABSCESS Right 09/07/2014   Procedure: INCISION AND DRAINAGE ABSCESS RIGHT FLANK;  Surgeon: Jackolyn Confer, MD;  Location: [facility];  Service: General;  Laterality: Right;  . SCROTUM EXPLORATION         Family History  Problem Relation Age of Onset  .  Diabetes Mother   . Diabetes Father   . Diabetes Brother     Social History   Tobacco Use  . Smoking status: Current Every Day Smoker    Packs/day: 0.50    Types: Cigarettes  . Smokeless tobacco: Never Used  Substance Use Topics  . Alcohol use: No  . Drug use: No    Home Medications Prior to Admission medications   Medication Sig Start Date End Date Taking? Authorizing Provider  doxycycline (VIBRAMYCIN) 100 MG capsule Take 1 capsule (100 mg total) by mouth 2 (two) times daily for 7 days. 11/07/19 11/14/19  [place] Lanius A, PA-C  insulin aspart protamine- aspart (NOVOLOG MIX 70/30) (70-30) 100 UNIT/ML injection Inject 0.35 mLs (35 Units total) into the skin 2 (two) times daily. 10/14/14   Lorayne Marek, MD    Allergies    Patient has no known allergies.  Review of Systems   Review of Systems  Constitutional: Negative for fever.  Respiratory: Negative for cough and shortness of breath.   Cardiovascular: Positive for leg swelling. Negative for chest pain.  Gastrointestinal: Negative for abdominal pain, nausea and vomiting.  Genitourinary: Negative for dysuria and hematuria.  Skin: Positive for wound.  Neurological: Negative for headaches.  All other systems reviewed and are negative.   Physical Exam Updated Vital Signs BP (!) 146/91 (BP Location: Left Arm)   Pulse (!) 103   Temp 98.3  F (36.8 C) (Oral)   Resp 16   SpO2 95%   Physical Exam Vitals and nursing note reviewed.  Constitutional:      Appearance: Normal appearance. He is well-developed.  HENT:     Head: Normocephalic and atraumatic.  Eyes:     General: Lids are normal.     Conjunctiva/sclera: Conjunctivae normal.     Pupils: Pupils are equal, round, and reactive to light.  Cardiovascular:     Rate and Rhythm: Normal rate and regular rhythm.     Pulses: Normal pulses.          Dorsalis pedis pulses are 2+ on the right side and 2+ on the left side.     Heart sounds: Normal heart sounds. No murmur. No  friction rub. No gallop.   Pulmonary:     Effort: Pulmonary effort is normal.     Breath sounds: Normal breath sounds.     Comments: Lungs clear to auscultation bilaterally.  Symmetric chest rise.  No wheezing, rales, rhonchi. Abdominal:     Palpations: Abdomen is soft. Abdomen is not rigid.     Tenderness: There is no abdominal tenderness. There is no guarding.     Comments: Abdomen is soft, non-distended, non-tender. No rigidity, No guarding. No peritoneal signs.  Musculoskeletal:        General: Normal range of motion.     Cervical back: Full passive range of motion without pain.     Comments: Diffuse edema noted to bilateral lower extremities.  No asymmetric swelling.  He has some mild overlying warmth noted to the right lower extremity.  No erythema.  Diffuse tenderness noted to the tib/fib region. No crepitus.   Skin:    General: Skin is warm and dry.     Capillary Refill: Capillary refill takes less than 2 seconds.     Comments: He has several areas of wounds some with scabbed over openings that are draining serosanguineous fluid.  No palpable fluctuance.  No raised abscess.  See photos below.  Neurological:     Mental Status: He is alert and oriented to person, place, and time.  Psychiatric:        Speech: Speech normal.             ED Results / Procedures / Treatments   Labs (all labs ordered are listed, but only abnormal results are displayed) Labs Reviewed  BASIC METABOLIC PANEL - Abnormal; Notable for the following components:      Result Value   Glucose, Bld 357 (*)    Calcium 7.9 (*)    All other components within normal limits  CBC WITH DIFFERENTIAL/PLATELET - Abnormal; Notable for the following components:   RBC 3.58 (*)    Hemoglobin 9.7 (*)    HCT 31.3 (*)    All other components within normal limits  AEROBIC CULTURE (SUPERFICIAL SPECIMEN)    EKG None  Radiology DG Tibia/Fibula Right  Result Date: 11/07/2019 CLINICAL DATA:  Right leg abscesses,  pain EXAM: RIGHT TIBIA AND FIBULA - 2 VIEW COMPARISON:  None. FINDINGS: Frontal and lateral views of the right tibia and fibula demonstrate no acute or destructive bony lesions. Alignment of the right knee and ankle is anatomic. There is diffuse subcutaneous edema. IMPRESSION: 1. No acute or destructive bony lesions. 2. Diffuse soft tissue edema. Electronically Signed   By: Randa Ngo M.D.   On: 11/07/2019 18:38    Procedures Procedures (including critical care time)  Medications Ordered in ED Medications  doxycycline (VIBRA-TABS) tablet 100 mg (100 mg Oral Given 11/07/19 1926)    ED Course  I have reviewed the triage vital signs and the nursing notes.  Pertinent labs & imaging results that were available during my care of the patient were reviewed by me and considered in my medical decision making (see chart for details).    MDM Rules/Calculators/A&P                      49 year old male who presents for evaluation of leg wounds.  He states he first noticed them about 2 weeks ago.  Since then, they have been actively draining on their own.  He has not noted any fevers but has had some swelling of his legs.  On initially arrival, he is afebrile, nontoxic-appearing.  Vital signs are stable.  On exam, he has several wounds noted to the right lower extremity.  Most of them are actively draining.  There is no fluctuance noted or any palpable abscess that would be amenable to I&D.  He does have history of nec fasc from an abscess to his back.  On my exam, there is no crepitus noted.  He has no fever here.  Doubt that this currently is nec fas.  Suspect his diabetes is causing poor wound healing and he has superficial infection.  Will obtain basic labs and x-ray imaging for evaluation of any acute bony abnormality.  BMP shows glucose of 357.  CBC shows no leukocytosis.  Hemoglobin is 9.7.  He has had evidence of anemia in the past.  X-ray negative for any acute or destructive bony lesions.  There  is diffuse soft tissue edema but no other abnormalities.  I discussed results with patient.  We discussed treatment options, including admission for antibiotics versus trial of p.o. antibiotics at home and have him return if he fails p.o. antibiotics.  At this time, patient wishes to go home with antibiotics.  Given his work-up here, I feel that this is reasonable. Discussed patient with Dr. Sherry Ruffing who is agreeable to plan. At this time, patient exhibits no emergent life-threatening condition that require further evaluation in ED. Patient had ample opportunity for questions and discussion. All patient's questions were answered with full understanding. Strict return precautions discussed. Patient expresses understanding and agreement to plan.   Portions of this note were generated with Lobbyist. Dictation errors may occur despite best attempts at proofreading.   Final Clinical Impression(s) / ED Diagnoses Final diagnoses:  Wound of right lower extremity, initial encounter  Cellulitis of right lower extremity    Rx / DC Orders ED Discharge Orders         Ordered    doxycycline (VIBRAMYCIN) 100 MG capsule  2 times daily     11/07/19 1903           Desma Mcgregor 11/07/19 1928    Tegeler, Gwenyth Allegra, MD 11/08/19 0221

## 2019-11-10 LAB — AEROBIC CULTURE? (SUPERFICIAL SPECIMEN)

## 2019-11-10 LAB — AEROBIC CULTURE W GRAM STAIN (SUPERFICIAL SPECIMEN): Gram Stain: NONE SEEN

## 2019-11-11 ENCOUNTER — Telehealth: Payer: Self-pay

## 2019-11-11 NOTE — Progress Notes (Signed)
ED Antimicrobial Stewardship Positive Culture Follow Up   Todd Mccoy is an 49 y.o. male who presented to [name] on 11/07/2019 with a chief complaint of  Chief Complaint  Patient presents with  . Abscess    Recent Results (from the past 720 hour(s))  Wound or Superficial Culture     Status: None   Collection Time: 11/07/19  7:25 PM   Specimen: Wound  Result Value Ref Range Status   Specimen Description   Final    WOUND RIGHT LEG Performed at [facility] [address] [place], [place] [address]    Special Requests   Final    NONE Performed at [facility], [place] [address], [place], [place] [address]    Gram Stain   Final    NO WBC SEEN ABUNDANT GRAM POSITIVE COCCI FEW GRAM POSITIVE RODS    Culture   Final    MODERATE STAPHYLOCOCCUS AUREUS FEW GROUP B STREP(S.AGALACTIAE)ISOLATED TESTING AGAINST S. AGALACTIAE NOT ROUTINELY PERFORMED DUE TO PREDICTABILITY OF AMP/PEN/VAN SUSCEPTIBILITY. MODERATE DIPHTHEROIDS(CORYNEBACTERIUM SPECIES) Standardized susceptibility testing for this organism is not available. Performed at [facility], [place] [address], [place], [place] [address]    Report Status 11/10/2019 FINAL  Final   Organism ID, Bacteria STAPHYLOCOCCUS AUREUS  Final      Susceptibility   Staphylococcus aureus - MIC*    CIPROFLOXACIN <=0.5 SENSITIVE Sensitive     ERYTHROMYCIN <=0.25 SENSITIVE Sensitive     GENTAMICIN <=0.5 SENSITIVE Sensitive     OXACILLIN 0.5 SENSITIVE Sensitive     TETRACYCLINE <=1 SENSITIVE Sensitive     VANCOMYCIN 1 SENSITIVE Sensitive     TRIMETH/SULFA <=10 SENSITIVE Sensitive     CLINDAMYCIN <=0.25 SENSITIVE Sensitive     RIFAMPIN <=0.5 SENSITIVE Sensitive     Inducible Clindamycin NEGATIVE Sensitive     * MODERATE STAPHYLOCOCCUS AUREUS    [x]  Treated with doxycycline, organism resistant to prescribed antimicrobial []  Patient discharged originally without antimicrobial agent and treatment is  now indicated  New antibiotic prescription:  - Cephalexin 500 mg TID x 7 days as per curb side with Dr. Megan Salon  ED Provider: Arlean Hopping, PA     Royetta Asal, PharmD, BCPS 11/11/2019 12:54 PM

## 2019-11-11 NOTE — Telephone Encounter (Signed)
Post ED Visit - Positive Culture Follow-up: Successful Patient Follow-Up  Culture assessed and recommendations reviewed by:  []  Elenor Quinones, Pharm.D. []  Heide Guile, Pharm.D., BCPS AQ-ID []  Parks Neptune, Pharm.D., BCPS []  Alycia Rossetti, Pharm.D., BCPS []  [place], Pharm.D., BCPS, AAHIVP []  Legrand Como, Pharm.D., BCPS, AAHIVP []  Salome Arnt, PharmD, BCPS []  Johnnette Gourd, PharmD, BCPS []  Hughes Better, PharmD, BCPS []  Leeroy Cha, PharmD Cora Daniels D Positive aerobic culture  []  Patient discharged without antimicrobial prescription and treatment is now indicated [x]  Organism is resistant to prescribed ED discharge antimicrobial []  Patient with positive blood cultures   Changes discussed with ED provider: Arlean Hopping PA New antibiotic prescription Cephalexin 500 mg TID x 7 days Called to [facility] [phone]  Contacted patient, date 11/11/19, time [place], Carolynn Comment 11/11/2019, 1:26 PM

## 2019-11-20 ENCOUNTER — Encounter (HOSPITAL_BASED_OUTPATIENT_CLINIC_OR_DEPARTMENT_OTHER): Payer: Self-pay | Attending: Internal Medicine | Admitting: Internal Medicine

## 2019-11-20 ENCOUNTER — Ambulatory Visit (HOSPITAL_COMMUNITY)
Admission: RE | Admit: 2019-11-20 | Discharge: 2019-11-20 | Disposition: A | Discharge status: Home / Self Care | Payer: Self-pay | Source: Ambulatory Visit | Attending: Internal Medicine | Admitting: Internal Medicine

## 2019-11-20 ENCOUNTER — Other Ambulatory Visit (HOSPITAL_COMMUNITY): Payer: Self-pay | Admitting: Internal Medicine

## 2019-11-20 DIAGNOSIS — S81801S Unspecified open wound, right lower leg, sequela: Secondary | ICD-10-CM | POA: Insufficient documentation

## 2019-11-20 DIAGNOSIS — E1142 Type 2 diabetes mellitus with diabetic polyneuropathy: Secondary | ICD-10-CM | POA: Insufficient documentation

## 2019-11-20 DIAGNOSIS — F17210 Nicotine dependence, cigarettes, uncomplicated: Secondary | ICD-10-CM | POA: Insufficient documentation

## 2019-11-20 DIAGNOSIS — L97514 Non-pressure chronic ulcer of other part of right foot with necrosis of bone: Secondary | ICD-10-CM | POA: Insufficient documentation

## 2019-11-20 DIAGNOSIS — E11622 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 ulcer: Secondary | ICD-10-CM | POA: Insufficient documentation

## 2019-11-20 DIAGNOSIS — L97818 Non-pressure chronic ulcer of other part of right lower leg with other specified severity: Secondary | ICD-10-CM | POA: Insufficient documentation

## 2019-11-20 DIAGNOSIS — E11621 Type 2 diabetes mellitus with foot ulcer: Secondary | ICD-10-CM | POA: Insufficient documentation

## 2019-11-20 DIAGNOSIS — I10 Essential (primary) hypertension: Secondary | ICD-10-CM | POA: Insufficient documentation

## 2019-11-20 NOTE — Progress Notes (Signed)
KENAN, MOODIE ([id number]) Visit Report for 11/20/2019 Abuse/Suicide Risk Screen Details Patient Name: Date of Service: MO [name] 11/20/2019 1:15 PM Medical Record Number: [id number] Patient Account Number: [id number] Date of Birth/Sex: Treating RN: Jul 12, 1970 (49 y.o. [name] Primary Care Adelard Sanon: PA [name], [place] Other Clinician: Referring Cheick Suhr: Treating Estalee Mccandlish/Extender: [name] in Treatment: 0 Abuse/Suicide Risk Screen Items Answer ABUSE RISK SCREEN: Has anyone close to you tried to hurt or harm you recentlyo No Do you feel uncomfortable with anyone in your familyo No Has anyone forced you do things that you didnt want to doo No Electronic Signature(s) Signed: 11/20/2019 5:27:28 PM By: Baruch Gouty RN, [name] Entered By: Baruch Gouty on 11/20/2019 14:20:00 -------------------------------------------------------------------------------- Activities of Daily Living Details Patient Name: Date of Service: [place] [name] 11/20/2019 1:15 PM Medical Record Number: [id number] Patient Account Number: [id number] Date of Birth/Sex: Treating RN: Sep 30, 1970 (49 y.o. [name] Primary Care Kimmarie Pascale: PA [name], [place] Other Clinician: Referring Juaquin Ludington: Treating Angell Pincock/Extender: [name] in Treatment: 0 Activities of Daily Living Items Answer Activities of Daily Living (Please select one for each item) Drive Automobile Completely Able T Medications ake Completely Able Use T elephone Completely Able Care for Appearance Completely Able Use T oilet Completely Able Bath / Shower Completely Able Dress Self Completely Able Feed Self Completely Able Walk Completely Able Get In / Out Bed Completely Able Housework Completely Able Prepare Meals Completely [place] Completely Able Shop for Self Completely Able Electronic Signature(s) Signed: 11/20/2019 5:27:28 PM By: Baruch Gouty RN, [name] Entered By:  Baruch Gouty on 11/20/2019 14:20:25 -------------------------------------------------------------------------------- Education Screening Details Patient Name: Date of Service: [place] [name] 11/20/2019 1:15 PM Medical Record Number: [id number] Patient Account Number: [id number] Date of Birth/Sex: Treating RN: 10/02/1970 (49 y.o. [name] Primary Care Phillip Maffei: PA [name], [place] Other Clinician: Referring Marlie Kuennen: Treating Danyale Ridinger/Extender: [name] in Treatment: 0 Primary Learner Assessed: Patient Learning Preferences/Education Level/Primary Language Learning Preference: Explanation, Demonstration, Printed Material Highest Education Level: High School Preferred Language: English Cognitive Barrier Language Barrier: No Translator Needed: No Memory Deficit: No Emotional Barrier: No Cultural/Religious Beliefs Affecting Medical Care: No Physical Barrier Impaired Vision: Yes Glasses, reading Impaired Hearing: No Decreased Hand dexterity: No Knowledge/Comprehension Knowledge Level: High Comprehension Level: High Ability to understand written instructions: High Ability to understand verbal instructions: High Motivation Anxiety Level: Calm Cooperation: Cooperative Education Importance: Acknowledges Need Interest in Health Problems: Asks Questions Perception: Coherent Willingness to Engage in Self-Management High Activities: Readiness to Engage in Self-Management High Activities: Electronic Signature(s) Signed: 11/20/2019 5:27:28 PM By: Baruch Gouty RN, [name] Entered By: Baruch Gouty on 11/20/2019 14:20:52 -------------------------------------------------------------------------------- Fall Risk Assessment Details Patient Name: Date of Service: [place] [name] 11/20/2019 1:15 PM Medical Record Number: [id number] Patient Account Number: [id number] Date of Birth/Sex: Treating RN: 01-19-1971 (49 y.o. [name] Primary  Care Lliam Hoh: PA [name], [place] Other Clinician: Referring Editha Bridgeforth: Treating Donnalee Cellucci/Extender: [name] in Treatment: 0 Fall Risk Assessment Items Have you had 2 or more falls in the last 12 monthso 0 No Have you had any fall that resulted in injury in the last 12 monthso 0 No FALLS RISK SCREEN History of falling - immediate or within 3 months 0 No Secondary diagnosis (Do you have 2 or more medical diagnoseso) 0 No Ambulatory aid None/bed rest/wheelchair/nurse 0 Yes Crutches/cane/walker 0 No Furniture 0 No Intravenous therapy Access/Saline/Heparin  Lock 0 No Gait/Transferring Normal/ bed rest/ wheelchair 0 Yes Weak (short steps with or without shuffle, stooped but able to lift head while walking, may seek 0 No support from furniture) Impaired (short steps with shuffle, may have difficulty arising from chair, head down, impaired 0 No balance) Mental Status Oriented to own ability 0 Yes Electronic Signature(s) Signed: 11/20/2019 5:27:28 PM By: Baruch Gouty RN, [name] Entered By: Baruch Gouty on 11/20/2019 14:21:04 -------------------------------------------------------------------------------- Foot Assessment Details Patient Name: Date of Service: [place] [name] 11/20/2019 1:15 PM Medical Record Number: [id number] Patient Account Number: [id number] Date of Birth/Sex: Treating RN: 02-Feb-1971 (49 y.o. [name] Primary Care Flordia Kassem: PA [name], [place] Other Clinician: Referring Rodgers Likes: Treating Rowin Bayron/Extender: [name] in Treatment: 0 Foot Assessment Items Site Locations + = Sensation present, - = Sensation absent, C = Callus, U = Ulcer R = Redness, W = Warmth, M = Maceration, PU = Pre-ulcerative lesion F = Fissure, S = Swelling, D = Dryness Assessment Right: Left: Other Deformity: No No Prior Foot Ulcer: No No Prior Amputation: No No Charcot Joint: No No Ambulatory Status: Ambulatory Without Help Gait: Steady Electronic  Signature(s) Signed: 11/20/2019 5:27:28 PM By: Baruch Gouty RN, [name] Entered By: Baruch Gouty on 11/20/2019 14:23:46 -------------------------------------------------------------------------------- Nutrition Risk Screening Details Patient Name: Date of Service: [place] [name] 11/20/2019 1:15 PM Medical Record Number: [id number] Patient Account Number: [id number] Date of Birth/Sex: Treating RN: October 09, 1970 (49 y.o. [name] Primary Care Alter Moss: PA [name], [place] Other Clinician: Referring Anju Sereno: Treating Hennesy Sobalvarro/Extender: [name] in Treatment: 0 Height (in): 70 Weight (lbs): 185 Body Mass Index (BMI): 26.5 Nutrition Risk Screening Items Score Screening NUTRITION RISK SCREEN: I have an illness or condition that made me change the kind and/or amount of food I eat 0 No I eat fewer than two meals per day 0 No I eat few fruits and vegetables, or milk products 0 No I have three or more drinks of beer, liquor or wine almost every day 0 No I have tooth or mouth problems that make it hard for me to eat 0 No I don't always have enough money to buy the food I need 0 No I eat alone most of the time 1 Yes I take three or more different prescribed or over-the-counter drugs a day 0 No Without wanting to, I have lost or gained 10 pounds in the last six months 0 No I am not always physically able to shop, cook and/or feed myself 0 No Nutrition Protocols Good Risk Protocol 0 No interventions needed Moderate Risk Protocol High Risk Proctocol Risk Level: Good Risk Score: 1 Electronic Signature(s) Signed: 11/20/2019 5:27:28 PM By: Baruch Gouty RN, [name] Entered By: Baruch Gouty on 11/20/2019 14:21:42

## 2019-11-22 NOTE — Progress Notes (Signed)
[name], [name] ([id number]) Visit Report for 11/20/2019 Allergy List Details Patient Name: Date of Service: [name] 11/20/2019 1:15 PM Medical Record Number: [id number] Patient Account Number: [id number] Date of Birth/Sex: Treating RN: 02-09-1971 (49 y.o. Todd Mccoy Primary Care [name]: PA [name], [place] Other Clinician: Referring [name]: Treating [name] in Treatment: 0 Allergies Active Allergies No Known Allergies Allergy Notes Electronic Signature(s) Signed: 11/20/2019 5:27:28 PM By: Baruch Gouty RN, [name] Entered By: Baruch Gouty on 11/20/2019 14:11:54 -------------------------------------------------------------------------------- Arrival Information Details Patient Name: Date of Service: [name] 11/20/2019 1:15 PM Medical Record Number: [id number] Patient Account Number: [id number] Date of Birth/Sex: Treating RN: [birth year] (49 y.o. Todd Mccoy Primary Care Todd Mccoy: PA [name], [place] Other Clinician: Referring [name] in Treatment: 0 Visit Information Patient Arrived: Ambulatory Arrival Time: 14:08 Accompanied By: self Transfer Assistance: None Patient Identification Verified: Yes Secondary Verification Process Completed: Yes Patient Requires Transmission-Based Precautions: No Patient Has Alerts: No Electronic Signature(s) Signed: 11/20/2019 5:27:28 PM By: Baruch Gouty RN, [name] Entered By: Baruch Gouty on 11/20/2019 14:08:32 -------------------------------------------------------------------------------- Clinic Level of Care Assessment Details Patient Name: Date of Service: [name] 11/20/2019 1:15 PM Medical Record Number: [id number] Patient Account Number: [id number] Date of Birth/Sex: Treating RN: Jan 08, 1971 (49 y.o. Todd Mccoy) Carlene Coria Primary Care [name]: PA [name], [place] Other Clinician: Referring  [name]: Treating [name] in Treatment: 0 Clinic Level of Care Assessment Items TOOL 1 Quantity Score X- 1 0 Use when EandM and Procedure is performed on INITIAL visit ASSESSMENTS - Nursing Assessment / Reassessment X- 1 20 General Physical Exam (combine w/ comprehensive assessment (listed just below) when performed on new pt. evals) X- 1 25 Comprehensive Assessment (HX, ROS, Risk Assessments, Wounds Hx, etc.) ASSESSMENTS - Wound and Skin Assessment / Reassessment []  - 0 Dermatologic / Skin Assessment (not related to wound area) ASSESSMENTS - Ostomy and/or Continence Assessment and Care []  - 0 Incontinence Assessment and Management []  - 0 Ostomy Care Assessment and Management (repouching, etc.) PROCESS - Coordination of Care X - Simple Patient / Family Education for ongoing care 1 15 []  - 0 Complex (extensive) Patient / Family Education for ongoing care X- 1 10 Staff obtains Programmer, systems, Records, T Results / Process Orders est []  - 0 Staff telephones HHA, Nursing Homes / Clarify orders / etc []  - 0 Routine Transfer to another Facility (non-emergent condition) []  - 0 Routine Hospital Admission (non-emergent condition) X- 1 15 New Admissions / Biomedical engineer / Ordering NPWT Apligraf, etc. , []  - 0 Emergency Hospital Admission (emergent condition) PROCESS - Special Needs []  - 0 Pediatric / Minor Patient Management []  - 0 Isolation Patient Management []  - 0 Hearing / Language / Visual special needs []  - 0 Assessment of Community assistance (transportation, D/C planning, etc.) []  - 0 Additional assistance / Altered mentation []  - 0 Support Surface(s) Assessment (bed, cushion, seat, etc.) INTERVENTIONS - Miscellaneous []  - 0 External ear exam []  - 0 Patient Transfer (multiple staff / Civil Service fast streamer / Similar devices) []  - 0 Simple Staple / Suture removal (25 or less) []  - 0 Complex Staple / Suture removal (26 or more) []  -  0 Hypo/Hyperglycemic Management (do not check if billed separately) X- 1 15 Ankle / Brachial Index (ABI) - do not check if billed separately Has the patient been seen at the hospital within the last three years: Yes  Total Score: 100 Level Of Care: New/Established - Level 3 Electronic Signature(s) Signed: 11/20/2019 5:13:38 PM By: Carlene Coria RN Entered By: Carlene Coria on 11/20/2019 16:51:53 -------------------------------------------------------------------------------- Compression Therapy Details Patient Name: Date of Service: [name] 11/20/2019 1:15 PM Medical Record Number: [id number] Patient Account Number: [id number] Date of Birth/Sex: Treating RN: [birth year] (49 y.o. Todd Mccoy) Carlene Coria Primary Care [name]: PA [name], [place] Other Clinician: Referring [name] in Treatment: 0 Compression Therapy Performed for Wound Assessment: Wound #1 Right,Distal,Posterior Lower Leg Performed By: Clinician Carlene Coria, RN Compression Type: Three Layer Post Procedure Diagnosis Same as Pre-procedure Electronic Signature(s) Signed: 11/20/2019 5:13:38 PM By: Carlene Coria RN Entered By: Carlene Coria on 11/20/2019 16:50:26 -------------------------------------------------------------------------------- Compression Therapy Details Patient Name: Date of Service: [name] 11/20/2019 1:15 PM Medical Record Number: [id number] Patient Account Number: [id number] Date of Birth/Sex: Treating RN: [birth year] (49 y.o. Todd Mccoy) Carlene Coria Primary Care [name]: PA [name], [place] Other Clinician: Referring [name]: Treating [name] in Treatment: 0 Compression Therapy Performed for Wound Assessment: Wound #2 Right,Proximal,Posterior Lower Leg Performed By: Clinician Carlene Coria, RN Compression Type: Three Layer Post Procedure Diagnosis Same as Pre-procedure Electronic Signature(s) Signed:  11/20/2019 5:13:38 PM By: Carlene Coria RN Entered By: Carlene Coria on 11/20/2019 16:50:26 -------------------------------------------------------------------------------- Compression Therapy Details Patient Name: Date of Service: [name] 11/20/2019 1:15 PM Medical Record Number: [id number] Patient Account Number: [id number] Date of Birth/Sex: Treating RN: 01-10-1971 (49 y.o. Todd Mccoy) Carlene Coria Primary Care Albi Rappaport: PA [name], [place] Other Clinician: Referring Ledonna Dormer: Treating Tevion Laforge/Extender: [name] in Treatment: 0 Compression Therapy Performed for Wound Assessment: Wound #3 Right,Lateral Lower Leg Performed By: Clinician Carlene Coria, RN Compression Type: Three Layer Post Procedure Diagnosis Same as Pre-procedure Electronic Signature(s) Signed: 11/20/2019 5:13:38 PM By: Carlene Coria RN Entered By: Carlene Coria on 11/20/2019 16:50:26 -------------------------------------------------------------------------------- Compression Therapy Details Patient Name: Date of Service: [name] 11/20/2019 1:15 PM Medical Record Number: [id number] Patient Account Number: [id number] Date of Birth/Sex: Treating RN: August 04, 1970 (49 y.o. Todd Mccoy) Carlene Coria Primary Care Carson Meche: PA [name], [place] Other Clinician: Referring Conroy Goracke: Treating Rubyann Lingle/Extender: [name] in Treatment: 0 Compression Therapy Performed for Wound Assessment: Wound #4 Right,Proximal,Lateral Lower Leg Performed By: Clinician Carlene Coria, RN Compression Type: Three Layer Post Procedure Diagnosis Same as Pre-procedure Electronic Signature(s) Signed: 11/20/2019 5:13:38 PM By: Carlene Coria RN Entered By: Carlene Coria on 11/20/2019 16:50:27 -------------------------------------------------------------------------------- Compression Therapy Details Patient Name: Date of Service: [name] 11/20/2019 1:15 PM Medical Record Number: [id number] Patient Account  Number: [id number] Date of Birth/Sex: Treating RN: 31-Jan-1971 (49 y.o. Todd Mccoy) Carlene Coria Primary Care Yazmen Briones: PA [name], [place] Other Clinician: Referring Lillieanna Tuohy: Treating Jonne Rote/Extender: [name] in Treatment: 0 Compression Therapy Performed for Wound Assessment: Wound #5 Right,Distal,Lateral Lower Leg Performed By: Clinician Carlene Coria, RN Compression Type: Three Layer Post Procedure Diagnosis Same as Pre-procedure Electronic Signature(s) Signed: 11/20/2019 5:13:38 PM By: Carlene Coria RN Entered By: Carlene Coria on 11/20/2019 16:50:27 -------------------------------------------------------------------------------- Compression Therapy Details Patient Name: Date of Service: [name] 11/20/2019 1:15 PM Medical Record Number: [id number] Patient Account Number: [id number] Date of Birth/Sex: Treating RN: 06-18-1971 (49 y.o. Todd Mccoy) Carlene Coria Primary Care Adyan Palau: PA [name], [place] Other Clinician: Referring Karter Hellmer: Treating Jermani Pund/Extender: [name] in Treatment: 0 Compression Therapy Performed for Wound Assessment: Wound #6 Right,Anterior  Lower Leg Performed By: Clinician Carlene Coria, RN Compression Type: Three Layer Post Procedure Diagnosis Same as Pre-procedure Electronic Signature(s) Signed: 11/20/2019 5:13:38 PM By: Carlene Coria RN Entered By: Carlene Coria on 11/20/2019 16:50:27 -------------------------------------------------------------------------------- Encounter Discharge Information Details Patient Name: Date of Service: [name] 11/20/2019 1:15 PM Medical Record Number: [id number] Patient Account Number: [id number] Date of Birth/Sex: Treating RN: 05-05-1971 (49 y.o. [name] Primary Care Jt Brabec: PA [name], [place] Other Clinician: Referring Sergio Hobart: Treating Laurina Fischl/Extender: [name] in Treatment: 0 Encounter Discharge Information Items Post Procedure Vitals Discharge Condition:  Stable Temperature (F): 98.3 Ambulatory Status: Ambulatory Pulse (bpm): 103 Discharge Destination: Home Respiratory Rate (breaths/min): 18 Transportation: Private Auto Blood Pressure (mmHg): 161/93 Accompanied By: self Schedule Follow-up Appointment: Yes Clinical Summary of Care: Patient Declined Electronic Signature(s) Signed: 11/20/2019 5:47:21 PM By: Kela Millin Entered By: Kela Millin on 11/20/2019 15:31:42 -------------------------------------------------------------------------------- Lower Extremity Assessment Details Patient Name: Date of Service: [name] 11/20/2019 1:15 PM Medical Record Number: [id number] Patient Account Number: [id number] Date of Birth/Sex: Treating RN: 05-Mar-1971 (49 y.o. Todd Mccoy Primary Care Antigone Crowell: PA [name], [place] Other Clinician: Referring Calleen Alvis: Treating Zenia Guest/Extender: [name] in Treatment: 0 Edema Assessment Assessed: [Left: No] [Right: No] Edema: [Left: Ye] [Right: s] Calf Left: Right: Point of Measurement: 30 cm From Medial Instep cm 37.5 cm Ankle Left: Right: Point of Measurement: 10 cm From Medial Instep cm 22.8 cm Vascular Assessment Blood Pressure: Brachial: [Right:161] Dorsalis Pedis: 164 Ankle: Posterior Tibial: 180 Ankle Brachial Index: [Right:1.12] Electronic Signature(s) Signed: 11/20/2019 5:27:28 PM By: Baruch Gouty RN, [name] Entered By: Baruch Gouty on 11/20/2019 14:33:07 -------------------------------------------------------------------------------- Multi Wound Chart Details Patient Name: Date of Service: [name] 11/20/2019 1:15 PM Medical Record Number: [id number] Patient Account Number: [id number] Date of Birth/Sex: Treating RN: [birth year] (49 y.o. Todd Mccoy) Carlene Coria Primary Care Sun Kihn: PA [name], [place] Other Clinician: Referring Shandrea Lusk: Treating Danney Bungert/Extender: [name] in Treatment: 0 Vital Signs Height(in):  70 Capillary Blood Glucose(mg/dl): 240 Weight(lbs): 185 Pulse(bpm): 102 Body Mass Index(BMI): 27 Blood Pressure(mmHg): 161/93 Temperature(F): 98.3 Respiratory Rate(breaths/min): 18 Photos: [1:No Photos Right, Distal, Posterior Lower Leg] [2:No Photos Right, Proximal, Posterior Lower Leg] [3:No Photos Right, Lateral Lower Leg] Wound Location: [1:Gradually Appeared] [2:Gradually Appeared] [3:Gradually Appeared] Wounding Event: [1:Diabetic Wound/Ulcer of the Lower] [2:Diabetic Wound/Ulcer of the Lower] [3:Diabetic Wound/Ulcer of the Lower] Primary Etiology: [1:Extremity Infection - not elsewhere classified] [2:Extremity Infection - not elsewhere classified] [3:Extremity Infection - not elsewhere classified] Secondary Etiology: [1:Type II Diabetes, Neuropathy] [2:Type II Diabetes, Neuropathy] [3:Type II Diabetes, Neuropathy] Comorbid History: [1:10/24/2019] [2:10/24/2019] [3:10/24/2019] Date Acquired: [1:0] [2:0] [3:0] Weeks of Treatment: [1:Open] [2:Open] [3:Open] Wound Status: [1:5x4.2x0.1] [2:0.5x0.5x0.1] [3:1.8x1x0.1] Measurements L x W x D (cm) [1:16.493] [2:0.196] [3:1.414] A (cm) : rea [1:1.649] [2:0.02] [3:0.141] Volume (cm) : [1:No] [2:No] [3:No] Undermining: [1:Grade 1] [2:Grade 1] [3:Grade 1] Classification: [1:Medium] [2:Small] [3:Medium] Exudate A mount: [1:Serosanguineous] [2:Serosanguineous] [3:Serosanguineous] Exudate Type: [1:red, brown] [2:red, brown] [3:red, brown] Exudate Color: [1:Flat and Intact] [2:Flat and Intact] [3:Flat and Intact] Wound Margin: [1:Small (1-33%)] [2:Large (67-100%)] [3:Small (1-33%)] Granulation A mount: [1:Red] [2:Red] [3:Red] Granulation Quality: [1:Large (67-100%)] [2:None Present (0%)] [3:Large (67-100%)] Necrotic A mount: [1:Adherent Slough] [2:N/A] [3:Adherent Slough] Necrotic Tissue: [1:Fat Layer (Subcutaneous Tissue)] [2:Fat Layer (Subcutaneous Tissue)] [3:Fat Layer (Subcutaneous Tissue)] Exposed Structures: [1:Exposed: Yes Fascia: No  Tendon: No Muscle: No Joint: No Bone: No Small (1-33%)] [2:Exposed: Yes Fascia: No Tendon: No  Muscle: No Joint: No Bone: No Small (1-33%)] [3:Exposed: Yes Fascia: No Tendon: No Muscle: No Joint: No Bone: No None] Epithelialization: [1:Debridement - Excisional] [2:N/A] [3:Debridement - Excisional] Debridement: Pre-procedure Verification/Time Out 15:00 [2:N/A] [3:15:00] Taken: [1:Lidocaine 4% Topical Solution] [2:N/A] [3:Lidocaine 4% Topical Solution] Pain Control: [1:Subcutaneous, Slough] [2:N/A] [3:Subcutaneous, Slough] Tissue Debrided: [1:Skin/Subcutaneous Tissue] [2:N/A] [3:Skin/Subcutaneous Tissue] Level: [1:21] [2:N/A] [3:1.8] Debridement A (sq cm): [1:rea Curette] [2:N/A] [3:Curette] Instrument: [1:Moderate] [2:N/A] [3:Moderate] Bleeding: [1:Pressure] [2:N/A] [3:Pressure] Hemostasis Achieved: [1:0] [2:N/A] [3:0] Procedural Pain: [1:0] [2:N/A] [3:0] Post Procedural Pain: [1:Procedure was tolerated well] [2:N/A] [3:Procedure was tolerated well] Debridement Treatment Response: [1:5x4.2x0.1] [2:N/A] [3:1.8x1x0.1] Post Debridement Measurements L x W x D (cm) [1:1.649] [2:N/A] [3:0.141] Post Debridement Volume: (cm) [1:Debridement] [2:N/A] [3:Debridement] Wound Number: [id number] 6  Photos: No Photos No Photos No Photos Right, Proximal, Lateral Lower Leg Right, Distal, Lateral Lower Leg Right, Anterior Lower Leg Wound Location: Gradually Appeared Gradually Appeared Gradually Appeared Wounding Event: Diabetic Wound/Ulcer of the Lower Diabetic Wound/Ulcer of the Lower Diabetic Wound/Ulcer of the Lower Primary Etiology: Extremity Extremity Extremity Infection - not elsewhere classified Infection - not elsewhere classified Infection - not elsewhere classified Secondary Etiology: Type II Diabetes, Neuropathy Type II Diabetes, Neuropathy Type II Diabetes, Neuropathy Comorbid History: 10/24/2019 10/24/2019 10/24/2019 Date Acquired: 0 0 0 Weeks of Treatment: Open Open Open Wound Status: 2x1.5x0.1  2.7x2x0.1 4x3.2x0.1 Measurements L x W x D (cm) 2.356 4.241 10.053 A (cm) : rea 0.236 0.424 1.005 Volume (cm) : No N/A No Undermining: Grade 1 Grade 1 Grade 1 Classification: None Present Medium Medium Exudate A mount: N/A Serosanguineous Serosanguineous Exudate Type: N/A red, brown red, brown Exudate Color: Flat and Intact Flat and Intact Flat and Intact Wound Margin: None Present (0%) Small (1-33%) Medium (34-66%) Granulation A mount: N/A Red Pink Granulation Quality: Large (67-100%) Large (67-100%) Medium (34-66%) Necrotic A mount: Eschar Eschar, Adherent Slough Adherent Slough Necrotic Tissue: Fat Layer (Subcutaneous Tissue) Fat Layer (Subcutaneous Tissue) Fat Layer (Subcutaneous Tissue) Exposed Structures: Exposed: Yes Exposed: Yes Exposed: Yes Fascia: No Fascia: No Fascia: No Tendon: No Tendon: No Tendon: No Muscle: No Muscle: No Muscle: No Joint: No Joint: No Joint: No Bone: No Bone: No Bone: No Small (1-33%) Small (1-33%) None Epithelialization: Debridement - Excisional Debridement - Excisional Debridement - Excisional Debridement: Pre-procedure Verification/Time Out 15:00 15:00 15:00 Taken: Lidocaine 4% Topical Solution Lidocaine 4% Topical Solution Lidocaine 4% Topical Solution Pain Control: Subcutaneous, Slough Subcutaneous, Slough Subcutaneous, Slough Tissue Debrided: Skin/Subcutaneous Tissue Skin/Subcutaneous Tissue Skin/Subcutaneous Tissue Level: 3 5.4 12.8 Debridement A (sq cm): rea Curette Curette Curette Instrument: Moderate Moderate Moderate Bleeding: Pressure Pressure Pressure Hemostasis A chieved: 0 0 0 Procedural Pain: 0 0 0 Post Procedural Pain: Procedure was tolerated well Procedure was tolerated well Procedure was tolerated well Debridement Treatment Response: 2x1.5x0.1 2.7x2x0.1 4x3.2x0.1 Post Debridement Measurements L x W x D (cm) 0.236 0.424 1.005 Post Debridement Volume: (cm) Debridement Debridement  Debridement Procedures Performed: Wound Number: 7 N/A N/A Photos: No Photos N/A N/A Right T Second oe N/A N/A Wound Location: Trauma N/A N/A Wounding Event: Diabetic Wound/Ulcer of the Lower N/A N/A Primary Etiology: Extremity N/A N/A N/A Secondary Etiology: Type II Diabetes, Neuropathy N/A N/A Comorbid History: 10/10/2019 N/A N/A Date Acquired: 0 N/A N/A Weeks of Treatment: Open N/A N/A Wound Status: 0.4x0.2x0.4 N/A N/A Measurements L x W x D (cm) 0.063 N/A N/A A (cm) : rea 0.025 N/A N/A Volume (cm) : 12 Starting Position 1 (o'clock): 12 Ending Position 1 (o'clock): 0.5 Maximum Distance  1 (cm): Yes N/A N/A Undermining: Grade 1 N/A N/A Classification: Small N/A N/A Exudate A mount: Serosanguineous N/A N/A Exudate Type: red, brown N/A N/A Exudate Color: Well defined, not attached N/A N/A Wound Margin: Large (67-100%) N/A N/A Granulation Amount: Pink N/A N/A Granulation Quality: None Present (0%) N/A N/A Necrotic Amount: N/A N/A N/A Necrotic Tissue: Fat Layer (Subcutaneous Tissue) N/A N/A Exposed Structures: Exposed: Yes Fascia: No Tendon: No Muscle: No Joint: No Bone: No None N/A N/A Epithelialization: N/A N/A N/A Debridement: N/A N/A N/A Pain Control: N/A N/A N/A Tissue Debrided: N/A N/A N/A Level: N/A N/A N/A Debridement A (sq cm): rea N/A N/A N/A Instrument: N/A N/A N/A Bleeding: N/A N/A N/A Hemostasis A chieved: N/A N/A N/A Procedural Pain: N/A N/A N/A Post Procedural Pain: Debridement Treatment Response: N/A N/A N/A Post Debridement Measurements L x N/A N/A N/A W x D (cm) N/A N/A N/A Post Debridement Volume: (cm) N/A N/A N/A Procedures Performed: Treatment Notes Electronic Signature(s) Signed: 11/20/2019 5:13:38 PM By: Carlene Coria RN Signed: 11/22/2019 12:52:55 PM By: Linton Ham MD Entered By: Linton Ham on 11/20/2019  15:12:27 -------------------------------------------------------------------------------- Multi-Disciplinary Care Plan Details Patient Name: Date of Service: [name] 11/20/2019 1:15 PM Medical Record Number: [id number] Patient Account Number: [id number] Date of Birth/Sex: Treating RN: 1970-11-08 (49 y.o. [name] Primary Care Brennan Karam: PA [name], [place] Other Clinician: Referring Antolin Belsito: Treating Najae Rathert/Extender: [name] in Treatment: 0 Active Inactive Wound/Skin Impairment Nursing Diagnoses: Knowledge deficit related to ulceration/compromised skin integrity Goals: Patient/caregiver will verbalize understanding of skin care regimen Date Initiated: 11/20/2019 Target Resolution Date: 12/21/2019 Goal Status: Active Ulcer/skin breakdown will have a volume reduction of 30% by week 4 Date Initiated: 11/20/2019 Target Resolution Date: 12/21/2019 Goal Status: Active Interventions: Assess patient/caregiver ability to obtain necessary supplies Assess patient/caregiver ability to perform ulcer/skin care regimen upon admission and as needed Assess ulceration(s) every visit Notes: Electronic Signature(s) Signed: 11/20/2019 5:47:21 PM By: Kela Millin Entered By: Kela Millin on 11/20/2019 14:58:21 -------------------------------------------------------------------------------- Pain Assessment Details Patient Name: Date of Service: [name] 11/20/2019 1:15 PM Medical Record Number: [id number] Patient Account Number: [id number] Date of Birth/Sex: Treating RN: 21-May-1971 (49 y.o. Todd Mccoy Primary Care Rakiya Krawczyk: PA [name], [place] Other Clinician: Referring Breeze Angell: Treating Askia Hazelip/Extender: [name] in Treatment: 0 Active Problems Location of Pain Severity and Description of Pain Patient Has Paino Yes Site Locations Pain Location: Pain in Ulcers With Dressing Change: Yes Duration of the  Pain. Constant / Intermittento Constant Rate the pain. Current Pain Level: 3 Worst Pain Level: 9 Least Pain Level: 2 Character of Pain Describe the Pain: Tender, Other: tightness Pain Management and Medication Current Pain Management: Medication: Yes Is the Current Pain Management Adequate: Adequate Rest: Yes How does your wound impact your activities of daily livingo Sleep: Yes Bathing: No Appetite: No Relationship With Others: No Bladder Continence: No Emotions: No Bowel Continence: No Work: Yes Toileting: No Drive: No Dressing: No Hobbies: Astronomer) Signed: 11/20/2019 5:27:28 PM By: Baruch Gouty RN, [name] Entered By: Baruch Gouty on 11/20/2019 14:08:12 -------------------------------------------------------------------------------- Patient/Caregiver Education Details Patient Name: Date of Service: [name] NA THA Robb Matar 5/18/2021andnbsp1:15 PM Medical Record Number: [id number] Patient Account Number: [id number] Date of Birth/Gender: Treating RN: 01-Apr-1971 (49 y.o. [name] Primary Care Physician: PA [name], [place] Other Clinician: Referring Physician: Treating Physician/Extender: [name] in Treatment: 0 Education Assessment Education Provided To: Patient Education Topics Provided  Wound/Skin Impairment: Methods: Explain/Verbal Responses: State content correctly Electronic Signature(s) Signed: 11/20/2019 5:47:21 PM By: Kela Millin Entered By: Kela Millin on 11/20/2019 14:58:30 -------------------------------------------------------------------------------- Wound Assessment Details Patient Name: Date of Service: [name] 11/20/2019 1:15 PM Medical Record Number: [id number] Patient Account Number: [id number] Date of Birth/Sex: Treating RN: January 01, 1971 (49 y.o. Todd Mccoy Primary Care Sharel Behne: PA [name], [place] Other Clinician: Referring Chanc Kervin: Treating Shandrea Lusk/Extender:  [name] in Treatment: 0 Wound Status Wound Number: 1 Primary Etiology: Diabetic Wound/Ulcer of the Lower Extremity Wound Location: Right, Distal, Posterior Lower Leg Secondary Etiology: Infection - not elsewhere classified Wounding Event: Gradually Appeared Wound Status: Open Date Acquired: 10/24/2019 Comorbid History: Type II Diabetes, Neuropathy Weeks Of Treatment: 0 Clustered Wound: No Wound Measurements Length: (cm) 5 Width: (cm) 4.2 Depth: (cm) 0.1 Area: (cm) 16.493 Volume: (cm) 1.649 % Reduction in Area: % Reduction in Volume: Epithelialization: Small (1-33%) Tunneling: No Undermining: No Wound Description Classification: Grade 1 Wound Margin: Flat and Intact Exudate Amount: Medium Exudate Type: Serosanguineous Exudate Color: red, brown Foul Odor After Cleansing: No Slough/Fibrino Yes Wound Bed Granulation Amount: Small (1-33%) Exposed Structure Granulation Quality: Red Fascia Exposed: No Necrotic Amount: Large (67-100%) Fat Layer (Subcutaneous Tissue) Exposed: Yes Necrotic Quality: Adherent Slough Tendon Exposed: No Muscle Exposed: No Joint Exposed: No Bone Exposed: No Treatment Notes Wound #1 (Right, Distal, Posterior Lower Leg) 1. Cleanse With Wound Cleanser 2. Periwound Care Moisturizing lotion 3. Primary Dressing Applied Calcium Alginate Ag 4. Secondary Dressing ABD Pad 6. Support Layer Applied 3 layer compression Water quality scientist) Signed: 11/20/2019 5:27:28 PM By: Baruch Gouty RN, [name] Entered By: Baruch Gouty on 11/20/2019 14:32:34 -------------------------------------------------------------------------------- Wound Assessment Details Patient Name: Date of Service: [name] 11/20/2019 1:15 PM Medical Record Number: [id number] Patient Account Number: [id number] Date of Birth/Sex: Treating RN: [birth year] (49 y.o. Todd Mccoy Primary Care Zeena Starkel: PA [name], [place] Other  Clinician: Referring Prarthana Parlin: Treating Serin Thornell/Extender: [name] in Treatment: 0 Wound Status Wound Number: 2 Primary Etiology: Diabetic Wound/Ulcer of the Lower Extremity Wound Location: Right, Proximal, Posterior Lower Leg Secondary Etiology: Infection - not elsewhere classified Wounding Event: Gradually Appeared Wound Status: Open Date Acquired: 10/24/2019 Comorbid History: Type II Diabetes, Neuropathy Weeks Of Treatment: 0 Clustered Wound: No Wound Measurements Length: (cm) 0.5 Width: (cm) 0.5 Depth: (cm) 0.1 Area: (cm) 0.196 Volume: (cm) 0.02 % Reduction in Area: % Reduction in Volume: Epithelialization: Small (1-33%) Tunneling: No Undermining: No Wound Description Classification: Grade 1 Wound Margin: Flat and Intact Exudate Amount: Small Exudate Type: Serosanguineous Exudate Color: red, brown Foul Odor After Cleansing: No Slough/Fibrino No Wound Bed Granulation Amount: Large (67-100%) Exposed Structure Granulation Quality: Red Fascia Exposed: No Necrotic Amount: None Present (0%) Fat Layer (Subcutaneous Tissue) Exposed: Yes Tendon Exposed: No Muscle Exposed: No Joint Exposed: No Bone Exposed: No Treatment Notes Wound #2 (Right, Proximal, Posterior Lower Leg) 1. Cleanse With Wound Cleanser 2. Periwound Care Moisturizing lotion 3. Primary Dressing Applied Calcium Alginate Ag 4. Secondary Dressing ABD Pad 6. Support Layer Applied 3 layer compression Water quality scientist) Signed: 11/20/2019 5:27:28 PM By: Baruch Gouty RN, [name] Entered By: Baruch Gouty on 11/20/2019 14:34:24 -------------------------------------------------------------------------------- Wound Assessment Details Patient Name: Date of Service: [name] 11/20/2019 1:15 PM Medical Record Number: [id number] Patient Account Number: [id number] Date of Birth/Sex: Treating RN: 02-24-1971 (49 y.o. Todd Mccoy Primary Care Johny Pitstick: PA  [name], [place] Other Clinician: Referring Buddy Loeffelholz: Treating  Angalena Cousineau/Extender: [name] in Treatment: 0 Wound Status Wound Number: 3 Primary Etiology: Diabetic Wound/Ulcer of the Lower Extremity Wound Location: Right, Lateral Lower Leg Secondary Etiology: Infection - not elsewhere classified Wounding Event: Gradually Appeared Wound Status: Open Date Acquired: 10/24/2019 Comorbid History: Type II Diabetes, Neuropathy Weeks Of Treatment: 0 Clustered Wound: No Wound Measurements Length: (cm) 1.8 Width: (cm) 1 Depth: (cm) 0.1 Area: (cm) 1.414 Volume: (cm) 0.141 % Reduction in Area: % Reduction in Volume: Epithelialization: None Tunneling: No Undermining: No Wound Description Classification: Grade 1 Wound Margin: Flat and Intact Exudate Amount: Medium Exudate Type: Serosanguineous Exudate Color: red, brown Foul Odor After Cleansing: No Slough/Fibrino Yes Wound Bed Granulation Amount: Small (1-33%) Exposed Structure Granulation Quality: Red Fascia Exposed: No Necrotic Amount: Large (67-100%) Fat Layer (Subcutaneous Tissue) Exposed: Yes Necrotic Quality: Adherent Slough Tendon Exposed: No Muscle Exposed: No Joint Exposed: No Bone Exposed: No Treatment Notes Wound #3 (Right, Lateral Lower Leg) 1. Cleanse With Wound Cleanser 2. Periwound Care Moisturizing lotion 3. Primary Dressing Applied Calcium Alginate Ag 4. Secondary Dressing ABD Pad 6. Support Layer Applied 3 layer compression Water quality scientist) Signed: 11/20/2019 5:27:28 PM By: Baruch Gouty RN, [name] Entered By: Baruch Gouty on 11/20/2019 14:36:20 -------------------------------------------------------------------------------- Wound Assessment Details Patient Name: Date of Service: [name] 11/20/2019 1:15 PM Medical Record Number: [id number] Patient Account Number: [id number] Date of Birth/Sex: Treating RN: 03/27/1971 (49 y.o. Todd Mccoy Primary Care  Recardo Linn: PA [place], [place] Other Clinician: Referring Kaely Hollan: Treating Vetta Couzens/Extender: [name] in Treatment: 0 Wound Status Wound Number: 4 Primary Etiology: Diabetic Wound/Ulcer of the Lower Extremity Wound Location: Right, Proximal, Lateral Lower Leg Secondary Etiology: Infection - not elsewhere classified Wounding Event: Gradually Appeared Wound Status: Open Date Acquired: 10/24/2019 Comorbid History: Type II Diabetes, Neuropathy Weeks Of Treatment: 0 Clustered Wound: No Wound Measurements Length: (cm) 2 Width: (cm) 1.5 Depth: (cm) 0.1 Area: (cm) 2.356 Volume: (cm) 0.236 % Reduction in Area: % Reduction in Volume: Epithelialization: Small (1-33%) Tunneling: No Undermining: No Wound Description Classification: Grade 1 Wound Margin: Flat and Intact Exudate Amount: None Present Foul Odor After Cleansing: No Slough/Fibrino No Wound Bed Granulation Amount: None Present (0%) Exposed Structure Necrotic Amount: Large (67-100%) Fascia Exposed: No Necrotic Quality: Eschar Fat Layer (Subcutaneous Tissue) Exposed: Yes Tendon Exposed: No Muscle Exposed: No Joint Exposed: No Bone Exposed: No Treatment Notes Wound #4 (Right, Proximal, Lateral Lower Leg) 1. Cleanse With Wound Cleanser 2. Periwound Care Moisturizing lotion 3. Primary Dressing Applied Calcium Alginate Ag 4. Secondary Dressing ABD Pad 6. Support Layer Applied 3 layer compression Water quality scientist) Signed: 11/20/2019 5:27:28 PM By: Baruch Gouty RN, [name] Entered By: Baruch Gouty on 11/20/2019 14:37:41 -------------------------------------------------------------------------------- Wound Assessment Details Patient Name: Date of Service: [name] 11/20/2019 1:15 PM Medical Record Number: [id number] Patient Account Number: [id number] Date of Birth/Sex: Treating RN: 11/15/1970 (49 y.o. Todd Mccoy Primary Care Savaya Hakes: PA [name], [place] Other  Clinician: Referring Vernell Townley: Treating Anvitha Hutmacher/Extender: [name] in Treatment: 0 Wound Status Wound Number: 5 Primary Etiology: Diabetic Wound/Ulcer of the Lower Extremity Wound Location: Right, Distal, Lateral Lower Leg Secondary Etiology: Infection - not elsewhere classified Wounding Event: Gradually Appeared Wound Status: Open Date Acquired: 10/24/2019 Comorbid History: Type II Diabetes, Neuropathy Weeks Of Treatment: 0 Clustered Wound: No Wound Measurements Length: (cm) 2.7 Width: (cm) 2 Depth: (cm) 0.1 Area: (cm) 4.241 Volume: (cm) 0.424 % Reduction in Area: % Reduction in Volume: Epithelialization: Small (  1-33%) Tunneling: No Wound Description Classification: Grade 1 Wound Margin: Flat and Intact Exudate Amount: Medium Exudate Type: Serosanguineous Exudate Color: red, brown Foul Odor After Cleansing: No Slough/Fibrino Yes Wound Bed Granulation Amount: Small (1-33%) Exposed Structure Granulation Quality: Red Fascia Exposed: No Necrotic Amount: Large (67-100%) Fat Layer (Subcutaneous Tissue) Exposed: Yes Necrotic Quality: Eschar, Adherent Slough Tendon Exposed: No Muscle Exposed: No Joint Exposed: No Bone Exposed: No Treatment Notes Wound #5 (Right, Distal, Lateral Lower Leg) 1. Cleanse With Wound Cleanser 2. Periwound Care Moisturizing lotion 3. Primary Dressing Applied Calcium Alginate Ag 4. Secondary Dressing ABD Pad 6. Support Layer Applied 3 layer compression Water quality scientist) Signed: 11/20/2019 5:27:28 PM By: Baruch Gouty RN, [name] Entered By: Baruch Gouty on 11/20/2019 14:39:08 -------------------------------------------------------------------------------- Wound Assessment Details Patient Name: Date of Service: [name] 11/20/2019 1:15 PM Medical Record Number: [id number] Patient Account Number: [id number] Date of Birth/Sex: Treating RN: Jun 30, 1971 (49 y.o. Todd Mccoy Primary Care  Myna Freimark: PA [name], [place] Other Clinician: Referring Rola Lennon: Treating Seichi Kaufhold/Extender: [name] in Treatment: 0 Wound Status Wound Number: 6 Primary Etiology: Diabetic Wound/Ulcer of the Lower Extremity Wound Location: Right, Anterior Lower Leg Secondary Etiology: Infection - not elsewhere classified Wounding Event: Gradually Appeared Wound Status: Open Date Acquired: 10/24/2019 Comorbid History: Type II Diabetes, Neuropathy Weeks Of Treatment: 0 Clustered Wound: No Wound Measurements Length: (cm) 4 Width: (cm) 3.2 Depth: (cm) 0.1 Area: (cm) 10.053 Volume: (cm) 1.005 % Reduction in Area: % Reduction in Volume: Epithelialization: None Tunneling: No Undermining: No Wound Description Classification: Grade 1 Wound Margin: Flat and Intact Exudate Amount: Medium Exudate Type: Serosanguineous Exudate Color: red, brown Foul Odor After Cleansing: No Slough/Fibrino Yes Wound Bed Granulation Amount: Medium (34-66%) Exposed Structure Granulation Quality: Pink Fascia Exposed: No Necrotic Amount: Medium (34-66%) Fat Layer (Subcutaneous Tissue) Exposed: Yes Necrotic Quality: Adherent Slough Tendon Exposed: No Muscle Exposed: No Joint Exposed: No Bone Exposed: No Treatment Notes Wound #6 (Right, Anterior Lower Leg) 1. Cleanse With Wound Cleanser 2. Periwound Care Moisturizing lotion 3. Primary Dressing Applied Calcium Alginate Ag 4. Secondary Dressing ABD Pad 6. Support Layer Applied 3 layer compression Water quality scientist) Signed: 11/20/2019 5:27:28 PM By: Baruch Gouty RN, [name] Entered By: Baruch Gouty on 11/20/2019 14:40:44 -------------------------------------------------------------------------------- Wound Assessment Details Patient Name: Date of Service: [name] 11/20/2019 1:15 PM Medical Record Number: [id number] Patient Account Number: [id number] Date of Birth/Sex: Treating RN: 1970/08/21 (49 y.o. Todd Mccoy Primary Care Artemisia Auvil: PA [name], [place] Other Clinician: Referring Ranbir Chew: Treating Dublin Cantero/Extender: [name] in Treatment: 0 Wound Status Wound Number: 7 Primary Etiology: Diabetic Wound/Ulcer of the Lower Extremity Wound Location: Right T Second oe Wound Status: Open Wounding Event: Trauma Comorbid History: Type II Diabetes, Neuropathy Date Acquired: 10/10/2019 Weeks Of Treatment: 0 Clustered Wound: No Wound Measurements Length: (cm) 0.4 Width: (cm) 0.2 Depth: (cm) 0.4 Area: (cm) 0.063 Volume: (cm) 0.025 % Reduction in Area: % Reduction in Volume: Epithelialization: None Tunneling: No Undermining: Yes Starting Position (o'clock): 12 Ending Position (o'clock): 12 Maximum Distance: (cm) 0.5 Wound Description Classification: Grade 1 Wound Margin: Well defined, not attached Exudate Amount: Small Exudate Type: Serosanguineous Exudate Color: red, brown Foul Odor After Cleansing: No Slough/Fibrino No Wound Bed Granulation Amount: Large (67-100%) Exposed Structure Granulation Quality: Pink Fascia Exposed: No Necrotic Amount: None Present (0%) Fat Layer (Subcutaneous Tissue) Exposed: Yes Tendon Exposed: No Muscle Exposed: No Joint Exposed: No Bone Exposed: No Treatment Notes Wound #7 (  Right Toe Second) 1. Cleanse With Wound Cleanser 2. Periwound Care Moisturizing lotion 3. Primary Dressing Applied Calcium Alginate Ag 4. Secondary Dressing ABD Pad 6. Support Layer Applied 3 layer compression Water quality scientist) Signed: 11/20/2019 5:27:28 PM By: Baruch Gouty RN, [name] Entered By: Baruch Gouty on 11/20/2019 14:42:16 -------------------------------------------------------------------------------- [place] Details Patient Name: Date of Service: [name] 11/20/2019 1:15 PM Medical Record Number: [id number] Patient Account Number: [id number] Date of Birth/Sex: Treating RN: Jul 24, 1970 (49 y.o. Todd Mccoy Primary Care Keira Bohlin: PA [name], [place] Other Clinician: Referring Meghanne Pletz: Treating Mykai Wendorf/Extender: [name] in Treatment: 0 Vital Signs Time Taken: 14:08 Temperature (F): 98.3 Height (in): 70 Pulse (bpm): 102 Source: Stated Respiratory Rate (breaths/min): 18 Weight (lbs): 185 Blood Pressure (mmHg): 161/93 Source: Stated Capillary Blood Glucose (mg/dl): 240 Body Mass Index (BMI): 26.5 Reference Range: 80 - 120 mg / dl Notes glucose per pt report yesterday Electronic Signature(s) Signed: 11/20/2019 5:27:28 PM By: Baruch Gouty RN, [name] Entered By: Baruch Gouty on 11/20/2019 14:09:36

## 2019-11-22 NOTE — Progress Notes (Signed)
RONAL, [place] ([id number]) Visit Report for 11/20/2019 Chief Complaint Document Details Patient Name: Date of Service: MO [name] 11/20/2019 1:15 PM Medical Record Number: [id number] Patient Account Number: [id number] Date of Birth/Sex: Treating RN: August 04, 1970 (49 y.o. [name]) Carlene Coria Primary Care Provider: PA [name], [place] Other Clinician: Referring Provider: Treating Provider/Extender: [name] in Treatment: 0 Information Obtained from: Patient Chief Complaint 11/19/2128; patient is here with for review of multiple wounds over the right lower leg, right great and right second toe Electronic Signature(s) Signed: 11/22/2019 12:52:55 PM By: Linton Ham MD Entered By: Linton Ham on 11/20/2019 15:13:52 -------------------------------------------------------------------------------- Debridement Details Patient Name: Date of Service: MO [name] 11/20/2019 1:15 PM Medical Record Number: [id number] Patient Account Number: [id number] Date of Birth/Sex: Treating RN: January 14, 1971 (49 y.o. [name]) Carlene Coria Primary Care Provider: PA [name], [place] Other Clinician: Referring Provider: Treating Provider/Extender: [name] in Treatment: 0 Debridement Performed for Assessment: Wound #1 Right,Distal,Posterior Lower Leg Performed By: Physician Ricard Dillon., MD Debridement Type: Debridement Severity of Tissue Pre Debridement: Fat layer exposed Level of Consciousness (Pre-procedure): Awake and Alert Pre-procedure Verification/Time Out Yes - 15:00 Taken: Start Time: 15:00 Pain Control: Lidocaine 4% T opical Solution T Area Debrided (L x W): otal 5 (cm) x 4.2 (cm) = 21 (cm) Tissue and other material debrided: Viable, Non-Viable, Slough, Subcutaneous, Skin: Dermis , Skin: Epidermis, Slough Level: Skin/Subcutaneous Tissue Debridement Description: Excisional Instrument: Curette Bleeding: Moderate Hemostasis Achieved: Pressure End Time:  15:05 Procedural Pain: 0 Post Procedural Pain: 0 Response to Treatment: Procedure was tolerated well Level of Consciousness (Post- Awake and Alert procedure): Post Debridement Measurements of Total Wound Length: (cm) 5 Width: (cm) 4.2 Depth: (cm) 0.1 Volume: (cm) 1.649 Character of Wound/Ulcer Post Debridement: Improved Severity of Tissue Post Debridement: Fat layer exposed Post Procedure Diagnosis Same as Pre-procedure Electronic Signature(s) Signed: 11/20/2019 5:13:38 PM By: Carlene Coria RN Signed: 11/22/2019 12:52:55 PM By: Linton Ham MD Entered By: Linton Ham on 11/20/2019 15:12:40 -------------------------------------------------------------------------------- Debridement Details Patient Name: Date of Service: MO [name] 11/20/2019 1:15 PM Medical Record Number: [id number] Patient Account Number: [id number] Date of Birth/Sex: Treating RN: 1971/01/01 (49 y.o. [name]) Carlene Coria Primary Care Provider: PA [name], [place] Other Clinician: Referring Provider: Treating Provider/Extender: [name] in Treatment: 0 Debridement Performed for Assessment: Wound #3 Right,Lateral Lower Leg Performed By: Physician Ricard Dillon., MD Debridement Type: Debridement Severity of Tissue Pre Debridement: Fat layer exposed Level of Consciousness (Pre-procedure): Awake and Alert Pre-procedure Verification/Time Out Yes - 15:00 Taken: Start Time: 15:00 Pain Control: Lidocaine 4% T opical Solution T Area Debrided (L x W): otal 1.8 (cm) x 1 (cm) = 1.8 (cm) Tissue and other material debrided: Viable, Non-Viable, Slough, Subcutaneous, Skin: Dermis , Skin: Epidermis, Slough Level: Skin/Subcutaneous Tissue Debridement Description: Excisional Instrument: Curette Bleeding: Moderate Hemostasis Achieved: Pressure End Time: 15:05 Procedural Pain: 0 Post Procedural Pain: 0 Response to Treatment: Procedure was tolerated well Level of Consciousness (Post- Awake and  Alert procedure): Post Debridement Measurements of Total Wound Length: (cm) 1.8 Width: (cm) 1 Depth: (cm) 0.1 Volume: (cm) 0.141 Character of Wound/Ulcer Post Debridement: Improved Severity of Tissue Post Debridement: Fat layer exposed Post Procedure Diagnosis Same as Pre-procedure Electronic Signature(s) Signed: 11/20/2019 5:13:38 PM By: Carlene Coria RN Signed: 11/22/2019 12:52:55 PM By: Linton Ham MD Entered By: Linton Ham on 11/20/2019 15:12:48 -------------------------------------------------------------------------------- Debridement Details Patient Name: Date of Service: MO [name] NA  THA N S. 11/20/2019 1:15 PM Medical Record Number: [id number] Patient Account Number: [id number] Date of Birth/Sex: Treating RN: 1971-07-02 (49 y.o. [name]) Carlene Coria Primary Care Provider: PA [name], [place] Other Clinician: Referring Provider: Treating Provider/Extender: [name] in Treatment: 0 Debridement Performed for Assessment: Wound #4 Right,Proximal,Lateral Lower Leg Performed By: Physician Ricard Dillon., MD Debridement Type: Debridement Severity of Tissue Pre Debridement: Fat layer exposed Level of Consciousness (Pre-procedure): Awake and Alert Pre-procedure Verification/Time Out Yes - 15:00 Taken: Start Time: 15:00 Pain Control: Lidocaine 4% T opical Solution T Area Debrided (L x W): otal 2 (cm) x 1.5 (cm) = 3 (cm) Tissue and other material debrided: Viable, Non-Viable, Slough, Subcutaneous, Skin: Dermis , Skin: Epidermis, Slough Level: Skin/Subcutaneous Tissue Debridement Description: Excisional Instrument: Curette Bleeding: Moderate Hemostasis Achieved: Pressure End Time: 15:05 Procedural Pain: 0 Post Procedural Pain: 0 Response to Treatment: Procedure was tolerated well Level of Consciousness (Post- Awake and Alert procedure): Post Debridement Measurements of Total Wound Length: (cm) 2 Width: (cm) 1.5 Depth: (cm) 0.1 Volume: (cm)  0.236 Character of Wound/Ulcer Post Debridement: Improved Severity of Tissue Post Debridement: Fat layer exposed Post Procedure Diagnosis Same as Pre-procedure Electronic Signature(s) Signed: 11/20/2019 5:13:38 PM By: Carlene Coria RN Signed: 11/22/2019 12:52:55 PM By: Linton Ham MD Entered By: Linton Ham on 11/20/2019 15:12:56 -------------------------------------------------------------------------------- Debridement Details Patient Name: Date of Service: MO [name] 11/20/2019 1:15 PM Medical Record Number: [id number] Patient Account Number: [id number] Date of Birth/Sex: Treating RN: [birth year] (49 y.o. [name]) Carlene Coria Primary Care Provider: PA [name], [place] Other Clinician: Referring Provider: Treating Provider/Extender: [name] in Treatment: 0 Debridement Performed for Assessment: Wound #5 Right,Distal,Lateral Lower Leg Performed By: Physician Ricard Dillon., MD Debridement Type: Debridement Severity of Tissue Pre Debridement: Fat layer exposed Level of Consciousness (Pre-procedure): Awake and Alert Pre-procedure Verification/Time Out Yes - 15:00 Taken: Start Time: 15:00 Pain Control: Lidocaine 4% T opical Solution T Area Debrided (L x W): otal 2.7 (cm) x 2 (cm) = 5.4 (cm) Tissue and other material debrided: Viable, Non-Viable, Slough, Subcutaneous, Skin: Dermis , Skin: Epidermis, Slough Level: Skin/Subcutaneous Tissue Debridement Description: Excisional Instrument: Curette Bleeding: Moderate Hemostasis Achieved: Pressure End Time: 15:05 Procedural Pain: 0 Post Procedural Pain: 0 Response to Treatment: Procedure was tolerated well Level of Consciousness (Post- Awake and Alert procedure): Post Debridement Measurements of Total Wound Length: (cm) 2.7 Width: (cm) 2 Depth: (cm) 0.1 Volume: (cm) 0.424 Character of Wound/Ulcer Post Debridement: Improved Severity of Tissue Post Debridement: Fat layer exposed Post Procedure  Diagnosis Same as Pre-procedure Electronic Signature(s) Signed: 11/20/2019 5:13:38 PM By: Carlene Coria RN Signed: 11/22/2019 12:52:55 PM By: Linton Ham MD Entered By: Linton Ham on 11/20/2019 15:13:04 -------------------------------------------------------------------------------- Debridement Details Patient Name: Date of Service: MO [name] 11/20/2019 1:15 PM Medical Record Number: [id number] Patient Account Number: [id number] Date of Birth/Sex: Treating RN: 1970/10/08 (49 y.o. [name]) Carlene Coria Primary Care Provider: PA [name], [place] Other Clinician: Referring Provider: Treating Provider/Extender: [name] in Treatment: 0 Debridement Performed for Assessment: Wound #6 Right,Anterior Lower Leg Performed By: Physician Ricard Dillon., MD Debridement Type: Debridement Severity of Tissue Pre Debridement: Fat layer exposed Level of Consciousness (Pre-procedure): Awake and Alert Pre-procedure Verification/Time Out Yes - 15:00 Taken: Start Time: 15:00 Pain Control: Lidocaine 4% T opical Solution T Area Debrided (L x W): otal 4 (cm) x 3.2 (cm) = 12.8 (cm) Tissue and other material debrided: Viable, Non-Viable, Slough, Subcutaneous, Skin: Dermis , Skin:  Epidermis, Slough Level: Skin/Subcutaneous Tissue Debridement Description: Excisional Instrument: Curette Bleeding: Moderate Hemostasis Achieved: Pressure End Time: 15:05 Procedural Pain: 0 Post Procedural Pain: 0 Response to Treatment: Procedure was tolerated well Level of Consciousness (Post- Awake and Alert procedure): Post Debridement Measurements of Total Wound Length: (cm) 4 Width: (cm) 3.2 Depth: (cm) 0.1 Volume: (cm) 1.005 Character of Wound/Ulcer Post Debridement: Improved Severity of Tissue Post Debridement: Fat layer exposed Post Procedure Diagnosis Same as Pre-procedure Electronic Signature(s) Signed: 11/20/2019 5:13:38 PM By: Carlene Coria RN Signed: 11/22/2019 12:52:55 PM By:  Linton Ham MD Entered By: Linton Ham on 11/20/2019 15:13:14 -------------------------------------------------------------------------------- HPI Details Patient Name: Date of Service: MO [name] 11/20/2019 1:15 PM Medical Record Number: [id number] Patient Account Number: [id number] Date of Birth/Sex: Treating RN: April 09, 1971 (49 y.o. Oval Linsey Primary Care Provider: PA [name], [place] Other Clinician: Referring Provider: Treating Provider/Extender: [name] in Treatment: 0 History of Present Illness HPI Description: ADMISSION 11/20/2019; this is a 49 year old man with poorly controlled type 2 diabetes. Roughly 3 to 4 weeks ago he noted swelling of his leg open wounds with draining sores. This happened fairly suddenly. He was seen in the ER on 11/07/2019 noted to have wounds of the right lower extremity x2 weeks at that point. An x-ray of the area was negative one of the wounds was cultured showing MSSA and group B strep. He was noted to have multiple wounds. He was given a course of doxycycline for 7 days white count was 8.4 hemoglobin 9.7. Since then he has been washing the leg and applying dry gauze. He is here in the clinic for review of this. 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 At that point his culture was MRSA. No no Electronic Signature(s) Signed: 11/22/2019 12:52:55 PM By: Linton Ham MD Entered By: Linton Ham on 11/20/2019 15:30:24 -------------------------------------------------------------------------------- Physical Exam Details Patient Name: Date of Service: MO [name] 11/20/2019 1:15 PM Medical Record Number: [id number] Patient Account Number: [id number] Date of Birth/Sex: Treating RN: [birth year] (49 y.o. Oval Linsey Primary Care Provider: PA [name], [place] Other  Clinician: Referring Provider: Treating Provider/Extender: [name] in Treatment: 0 Constitutional Patient is hypertensive.. Pulse regular and within target range for patient.Marland Kitchen Respirations regular, non-labored and within target range.. Temperature is normal and within the target range for the patient.Marland Kitchen Appears in no distress. Respiratory work of breathing is normal. Bilateral breath sounds are clear and equal in all lobes with no wheezes, rales or rhonchi.. Cardiovascular Heart rhythm and rate regular, without murmur or gallop.. Pedal pulses palpable. Notes Wound exam Patient has 5 wounds on the lateral posterior and anterior lower extremities. All of these covered in adherent debris. Debridement with a #5 curette use vigorous cleansing with Anasept and gauze. There is no purulence and no surrounding cellulitis. The appearance of the wound is better than the pictures from the ER 2 weeks ago HOWEVER he has a grossly erythematous right second toe with a wound over the dorsal DIP that probes to bone. Likely to have underlying osteomyelitis here. He has a small superficial area on the plantar aspect of the right great toe Electronic Signature(s) Signed: 11/22/2019 12:52:55 PM By: Linton Ham MD Entered By: Linton Ham on 11/20/2019 15:37:54 -------------------------------------------------------------------------------- Physician Orders Details Patient Name: Date of Service: MO RRISO N,  JO NA THA N S. 11/20/2019 1:15 PM Medical Record Number: [id number] Patient Account Number: [id number] Date of Birth/Sex: Treating RN: [birth year] (49 y.o. [name] Primary Care Provider: PA [name], [place] Other Clinician: Referring Provider: Treating Provider/Extender: [name] in Treatment: 0 Verbal / Phone Orders: No Diagnosis Coding Follow-up Appointments Return Appointment in 1 week. Dressing Change Frequency Wound #1 Right,Distal,Posterior Lower Leg Do not  change entire dressing for one week. Wound #2 Right,Proximal,Posterior Lower Leg Do not change entire dressing for one week. Wound #3 Right,Lateral Lower Leg Do not change entire dressing for one week. Wound #4 Right,Proximal,Lateral Lower Leg Do not change entire dressing for one week. Wound #5 Right,Distal,Lateral Lower Leg Do not change entire dressing for one week. Wound #6 Right,Anterior Lower Leg Do not change entire dressing for one week. Wound #7 Right T Second oe Change Dressing every other day. Skin Barriers/Peri-Wound Care Moisturizing lotion Wound Cleansing Wound #1 Right,Distal,Posterior Lower Leg May shower with protection. Wound #2 Right,Proximal,Posterior Lower Leg May shower with protection. Wound #3 Right,Lateral Lower Leg May shower with protection. Wound #4 Right,Proximal,Lateral Lower Leg May shower with protection. Wound #5 Right,Distal,Lateral Lower Leg May shower with protection. Wound #6 Right,Anterior Lower Leg May shower with protection. Wound #7 Right T Second oe May shower with protection. Primary Wound Dressing Wound #1 Right,Distal,Posterior Lower Leg Calcium Alginate with Silver Wound #2 Right,Proximal,Posterior Lower Leg Calcium Alginate with Silver Wound #3 Right,Lateral Lower Leg Calcium Alginate with Silver Wound #4 Right,Proximal,Lateral Lower Leg Calcium Alginate with Silver Wound #5 Right,Distal,Lateral Lower Leg Calcium Alginate with Silver Wound #6 Right,Anterior Lower Leg Calcium Alginate with Silver Wound #7 Right T Second oe Calcium Alginate with Silver Secondary Dressing Wound #1 Right,Distal,Posterior Lower Leg Dry Gauze ABD pad Wound #2 Right,Proximal,Posterior Lower Leg Dry Gauze ABD pad Wound #3 Right,Lateral Lower Leg Dry Gauze ABD pad Wound #4 Right,Proximal,Lateral Lower Leg Dry Gauze ABD pad Wound #5 Right,Distal,Lateral Lower Leg Dry Gauze ABD pad Wound #6 Right,Anterior Lower Leg Dry Gauze ABD  pad Wound #7 Right T Second oe Kerlix/Rolled Gauze - secure with tape or netting Dry Gauze Edema Control Wound #1 Right,Distal,Posterior Lower Leg 3 Layer Compression System - Right Lower Extremity Wound #2 Right,Proximal,Posterior Lower Leg 3 Layer Compression System - Right Lower Extremity Wound #3 Right,Lateral Lower Leg 3 Layer Compression System - Right Lower Extremity Wound #4 Right,Proximal,Lateral Lower Leg 3 Layer Compression System - Right Lower Extremity Wound #5 Right,Distal,Lateral Lower Leg 3 Layer Compression System - Right Lower Extremity Wound #6 Right,Anterior Lower Leg 3 Layer Compression System - Right Lower Extremity Radiology X-ray, foot right - right foot / 2nd toe, non healing wound with swelling and pain Patient Medications llergies: No Known Allergies A Notifications Medication Indication Start End celluitis right leg, right 11/20/2019 cephalexin foot infection DOSE oral 500 mg capsule - 1 capsule oral q6h for 10 days Electronic Signature(s) Signed: 11/20/2019 3:42:17 PM By: Linton Ham MD Entered By: Linton Ham on 11/20/2019 15:42:16 Prescription 11/20/2019 -------------------------------------------------------------------------------- Jeri Lager MD Patient Name: Provider: 1970-12-10 [id number] Date of Birth: NPI#[place] Sex: DEA #: [id number] Phone #: License #: [place] Patient Address: [address] [place], [place] [address] [place], [place] [address] [phone] Allergies Name Reaction Severity No Known Allergies Provider's Orders X-ray, foot right - right foot / 2nd toe, non healing wound with swelling and pain Hand Signature: Date(s): Electronic Signature(s) Signed: 11/22/2019 12:52:55 PM  By: Linton Ham MD Entered By: Linton Ham on 11/20/2019  15:42:18 -------------------------------------------------------------------------------- Problem List Details Patient Name: Date of Service: MO [name] 11/20/2019 1:15 PM Medical Record Number: [id number] Patient Account Number: [id number] Date of Birth/Sex: Treating RN: 1971-04-30 (49 y.o. 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 11/20/2019 1:15 PM Medical Record Number: [id number] Patient Account Number: [id number] Date of Birth/Sex: Treating RN: [birth year] (49 y.o. [name]) Carlene Coria Primary Care Provider: PA [name], [place] Other Clinician: Referring Provider: Treating Provider/Extender: [name] in Treatment: 0 Subjective Chief Complaint Information obtained from Patient 11/19/2128; patient is here with for review of multiple wounds over the right lower leg, right great and right second toe History of Present Illness (HPI) ADMISSION 11/20/2019; this is a 49 year old man with poorly controlled type 2 diabetes. Roughly 3 to  4 weeks ago he noted swelling of his leg open wounds with draining sores. This happened fairly suddenly. He was seen in the ER on 11/07/2019 noted to have wounds of the right lower extremity x2 weeks at that point. An x-ray of the area was negative one of the wounds was cultured showing MSSA and group B strep. He was noted to have multiple wounds. He was given a course of doxycycline for 7 days white count was 8.4 hemoglobin 9.7. Since then he has been washing the leg and applying dry gauze. He is here in the clinic for review of this. 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 At that point his culture was MRSA. No no Patient History Information obtained from Patient. Allergies No Known Allergies Family History Diabetes - Mother, Hypertension - Mother, No family history of Cancer, Heart Disease, Hereditary Spherocytosis, Kidney Disease, Lung Disease, Seizures, Stroke, Thyroid Problems, Tuberculosis. Social History Current every day smoker - 1/2 ppd, Marital Status - Single, Alcohol Use - Never, Drug Use - No History, Caffeine Use - Daily - soda, tea. Medical History Eyes Denies history of Cataracts, Glaucoma, Optic Neuritis Ear/Nose/Mouth/Throat Denies history of Chronic sinus problems/congestion, Middle ear problems Endocrine Patient has history of Type II Diabetes Denies history of Type I Diabetes Genitourinary Denies history of End Stage Renal Disease Integumentary (Skin) Denies history of History of Burn Neurologic Patient has history of Neuropathy Oncologic Denies history of Received Chemotherapy, Received Radiation Psychiatric Denies history of Anorexia/bulimia, Confinement Anxiety Patient is treated with Insulin. Blood sugar is tested. Blood sugar results noted at the following times: Breakfast - 170. Hospitalization/Surgery History -  IandD abscess right flank. - scrotum exploration. Review of Systems (ROS) Constitutional Symptoms (General Health) Denies complaints or  symptoms of Fatigue, Fever, Chills, Marked Weight Change. Eyes Complains or has symptoms of Glasses / Contacts - reading. Denies complaints or symptoms of Dry Eyes, Vision Changes. Ear/Nose/Mouth/Throat Denies complaints or symptoms of Chronic sinus problems or rhinitis. Respiratory Denies complaints or symptoms of Chronic or frequent coughs, Shortness of Breath. Cardiovascular Denies complaints or symptoms of Chest pain. Gastrointestinal Denies complaints or symptoms of Frequent diarrhea, Nausea, Vomiting. Endocrine Denies complaints or symptoms of Heat/cold intolerance. Genitourinary Denies complaints or symptoms of Frequent urination. Integumentary (Skin) Complains or has symptoms of Wounds - right lower leg. Musculoskeletal Denies complaints or symptoms of Muscle Pain, Muscle Weakness. Neurologic Complains or has symptoms of Numbness/parasthesias - both feet. Psychiatric Denies complaints or symptoms of Claustrophobia, Suicidal. Objective Constitutional Patient is hypertensive.. Pulse regular and within target range for patient.Marland Kitchen Respirations regular, non-labored and within target range.. Temperature is normal and within the target range for the patient.Marland Kitchen Appears in no distress. Vitals Time Taken: 2:08 PM, Height: 70 in, Source: Stated, Weight: 185 lbs, Source: Stated, BMI: 26.5, Temperature: 98.3 F, Pulse: 102 bpm, Respiratory Rate: 18 breaths/min, Blood Pressure: 161/93 mmHg, Capillary Blood Glucose: 240 mg/dl. General Notes: glucose per pt report yesterday Respiratory work of breathing is normal. Bilateral breath sounds are clear and equal in all lobes with no wheezes, rales or rhonchi.. Cardiovascular Heart rhythm and rate regular, without murmur or gallop.. Pedal pulses palpable. General Notes: Wound exam ooPatient has 5 wounds on  the lateral posterior and anterior lower extremities. All of these covered in adherent debris. Debridement with a #5 curette use vigorous cleansing with Anasept and gauze. There is no purulence and no surrounding cellulitis. The appearance of the wound is better than the pictures from the ER 2 weeks ago ooHOWEVER he has a grossly erythematous right second toe with a wound over the dorsal DIP that probes to bone. Likely to have underlying osteomyelitis here. ooHe has a small superficial area on the plantar aspect of the right great toe Integumentary (Hair, Skin) Wound #1 status is Open. Original cause of wound was Gradually Appeared. The wound is located on the Right,Distal,Posterior Lower Leg. The wound measures 5cm length x 4.2cm width x 0.1cm depth; 16.493cm^2 area and 1.649cm^3 volume. There is Fat Layer (Subcutaneous Tissue) Exposed exposed. There is no tunneling or undermining noted. There is a medium amount of serosanguineous drainage noted. The wound margin is flat and intact. There is small (1-33%) red granulation within the wound bed. There is a large (67-100%) amount of necrotic tissue within the wound bed including Adherent Slough. Wound #2 status is Open. Original cause of wound was Gradually Appeared. The wound is located on the Right,Proximal,Posterior Lower Leg. The wound measures 0.5cm length x 0.5cm width x 0.1cm depth; 0.196cm^2 area and 0.02cm^3 volume. There is Fat Layer (Subcutaneous Tissue) Exposed exposed. There is no tunneling or undermining noted. There is a small amount of serosanguineous drainage noted. The wound margin is flat and intact. There is large (67- 100%) red granulation within the wound bed. There is no necrotic tissue within the wound bed. Wound #3 status is Open. Original cause of wound was Gradually Appeared. The wound is located on the Right,Lateral Lower Leg. The wound measures 1.8cm length x 1cm width x 0.1cm depth; 1.414cm^2 area and 0.141cm^3 volume.  There is Fat Layer (Subcutaneous Tissue) Exposed exposed. There is no tunneling or undermining noted. There is a medium amount of serosanguineous drainage noted. The wound margin is flat and intact. There is small (1-33%) red granulation  within the wound bed. There is a large (67-100%) amount of necrotic tissue within the wound bed including Adherent Slough. Wound #4 status is Open. Original cause of wound was Gradually Appeared. The wound is located on the Right,Proximal,Lateral Lower Leg. The wound measures 2cm length x 1.5cm width x 0.1cm depth; 2.356cm^2 area and 0.236cm^3 volume. There is Fat Layer (Subcutaneous Tissue) Exposed exposed. There is no tunneling or undermining noted. There is a none present amount of drainage noted. The wound margin is flat and intact. There is no granulation within the wound bed. There is a large (67-100%) amount of necrotic tissue within the wound bed including Eschar. Wound #5 status is Open. Original cause of wound was Gradually Appeared. The wound is located on the Right,Distal,Lateral Lower Leg. The wound measures 2.7cm length x 2cm width x 0.1cm depth; 4.241cm^2 area and 0.424cm^3 volume. There is Fat Layer (Subcutaneous Tissue) Exposed exposed. There is no tunneling noted. There is a medium amount of serosanguineous drainage noted. The wound margin is flat and intact. There is small (1-33%) red granulation within the wound bed. There is a large (67-100%) amount of necrotic tissue within the wound bed including Eschar and Adherent Slough. Wound #6 status is Open. Original cause of wound was Gradually Appeared. The wound is located on the Right,Anterior Lower Leg. The wound measures 4cm length x 3.2cm width x 0.1cm depth; 10.053cm^2 area and 1.005cm^3 volume. There is Fat Layer (Subcutaneous Tissue) Exposed exposed. There is no tunneling or undermining noted. There is a medium amount of serosanguineous drainage noted. The wound margin is flat and intact. There is  medium (34-66%) pink granulation within the wound bed. There is a medium (34-66%) amount of necrotic tissue within the wound bed including Adherent Slough. Wound #7 status is Open. Original cause of wound was Trauma. The wound is located on the Right T Second. The wound measures 0.4cm length x 0.2cm oe width x 0.4cm depth; 0.063cm^2 area and 0.025cm^3 volume. There is Fat Layer (Subcutaneous Tissue) Exposed exposed. There is no tunneling noted, however, there is undermining starting at 12:00 and ending at 12:00 with a maximum distance of 0.5cm. There is a small amount of serosanguineous drainage noted. The wound margin is well defined and not attached to the wound base. There is large (67-100%) pink granulation within the wound bed. There is no necrotic tissue within the wound bed. 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 There was a Excisional Skin/Subcutaneous Tissue Debridement with a total area of 21 sq cm performed by Ricard Dillon., MD. With the following instrument(s): Curette to remove Viable and Non-Viable tissue/material. Material removed includes Subcutaneous Tissue, Slough, Skin: Dermis, and Skin: Epidermis after achieving pain control using Lidocaine 4% T opical Solution. No specimens were taken. A time out was conducted at 15:00, prior to the start of the procedure. A Moderate amount of bleeding was controlled with Pressure. The procedure was tolerated well with a pain  level of 0 throughout and a pain level of 0 following the procedure. Post Debridement Measurements: 5cm length x  4.2cm width x 0.1cm depth; 1.649cm^3 volume. Character of Wound/Ulcer Post Debridement is improved. Severity of Tissue Post Debridement is: Fat layer exposed. Post procedure Diagnosis Wound #1: Same as Pre-Procedure Wound #3 Pre-procedure diagnosis of Wound #3 is a Diabetic Wound/Ulcer of the Lower Extremity located on the Right,Lateral Lower Leg .Severity of Tissue Pre Debridement is: Fat layer exposed. There was a Excisional Skin/Subcutaneous Tissue Debridement with a total area of 1.8 sq cm performed by Ricard Dillon., MD. With the following instrument(s): Curette to remove Viable and Non-Viable tissue/material. Material removed includes Subcutaneous Tissue, Slough, Skin: Dermis, and Skin: Epidermis after achieving pain control using Lidocaine 4% T opical Solution. No specimens were taken. A time out was conducted at 15:00, prior to the start of the procedure. A Moderate amount of bleeding was controlled with Pressure. The procedure was tolerated well with a pain level of 0 throughout and a pain level of 0 following the procedure. Post Debridement Measurements: 1.8cm length x 1cm width x 0.1cm depth; 0.141cm^3 volume. Character of Wound/Ulcer Post Debridement is improved. Severity of Tissue Post Debridement is: Fat layer exposed. Post procedure Diagnosis Wound #3: Same as Pre-Procedure Wound #4 Pre-procedure diagnosis of Wound #4 is a Diabetic Wound/Ulcer of the Lower Extremity located on the Right,Proximal,Lateral Lower Leg .Severity of Tissue Pre Debridement is: Fat layer exposed. There was a Excisional Skin/Subcutaneous Tissue Debridement with a total area of 3 sq cm performed by Ricard Dillon., MD. With the following instrument(s): Curette to remove Viable and Non-Viable tissue/material. Material removed includes Subcutaneous Tissue, Slough, Skin: Dermis, and Skin:  Epidermis after achieving pain control using Lidocaine 4% T opical Solution. No specimens were taken. A time out was conducted at 15:00, prior to the start of the procedure. A Moderate amount of bleeding was controlled with Pressure. The procedure was tolerated well with a pain level of 0 throughout and a pain level of 0 following the procedure. Post Debridement Measurements: 2cm length x 1.5cm width x 0.1cm depth; 0.236cm^3 volume. Character of Wound/Ulcer Post Debridement is improved. Severity of Tissue Post Debridement is: Fat layer exposed. Post procedure Diagnosis Wound #4: Same as Pre-Procedure Wound #5 Pre-procedure diagnosis of Wound #5 is a Diabetic Wound/Ulcer of the Lower Extremity located on the Right,Distal,Lateral Lower Leg .Severity of Tissue Pre Debridement is: Fat layer exposed. There was a Excisional Skin/Subcutaneous Tissue Debridement with a total area of 5.4 sq cm performed by Ricard Dillon., MD. With the following instrument(s): Curette to remove Viable and Non-Viable tissue/material. Material removed includes Subcutaneous Tissue, Slough, Skin: Dermis, and Skin: Epidermis after achieving pain control using Lidocaine 4% T opical Solution. No specimens were taken. A time out was conducted at 15:00, prior to the start of the procedure. A Moderate amount of bleeding was controlled with Pressure. The procedure was tolerated well with a pain level of 0 throughout and a pain level of 0 following the procedure. Post Debridement Measurements: 2.7cm length x 2cm width x 0.1cm depth; 0.424cm^3 volume. Character of Wound/Ulcer Post Debridement is improved. Severity of Tissue Post Debridement is: Fat layer exposed. Post procedure Diagnosis Wound #5: Same as Pre-Procedure Wound #6 Pre-procedure diagnosis of Wound #6 is a Diabetic Wound/Ulcer of the Lower Extremity located on the Right,Anterior Lower Leg .Severity of Tissue Pre Debridement is: Fat layer exposed. There was a Excisional  Skin/Subcutaneous Tissue Debridement with a total area of 12.8 sq cm performed by Ricard Dillon., MD. With the following instrument(s): Curette to remove Viable and Non-Viable tissue/material. Material  removed includes Subcutaneous Tissue, Slough, Skin: Dermis, and Skin: Epidermis after achieving pain control using Lidocaine 4% T opical Solution. No specimens were taken. A time out was conducted at 15:00, prior to the start of the procedure. A Moderate amount of bleeding was controlled with Pressure. The procedure was tolerated well with a pain level of 0 throughout and a pain level of 0 following the procedure. Post Debridement Measurements: 4cm length x 3.2cm width x 0.1cm depth; 1.005cm^3 volume. Character of Wound/Ulcer Post Debridement is improved. Severity of Tissue Post Debridement is: Fat layer exposed. Post procedure Diagnosis Wound #6: Same as Pre-Procedure Plan Follow-up Appointments: Return Appointment in 1 week. Dressing Change Frequency: Wound #1 Right,Distal,Posterior Lower Leg: Do not change entire dressing for one week. Wound #2 Right,Proximal,Posterior Lower Leg: Do not change entire dressing for one week. Wound #3 Right,Lateral Lower Leg: Do not change entire dressing for one week. Wound #4 Right,Proximal,Lateral Lower Leg: Do not change entire dressing for one week. Wound #5 Right,Distal,Lateral Lower Leg: Do not change entire dressing for one week. Wound #6 Right,Anterior Lower Leg: Do not change entire dressing for one week. Wound #7 Right T Second: oe Change Dressing every other day. Skin Barriers/Peri-Wound Care: Moisturizing lotion Wound Cleansing: Wound #1 Right,Distal,Posterior Lower Leg: May shower with protection. Wound #2 Right,Proximal,Posterior Lower Leg: May shower with protection. Wound #3 Right,Lateral Lower Leg: May shower with protection. Wound #4 Right,Proximal,Lateral Lower Leg: May shower with protection. Wound #5 Right,Distal,Lateral  Lower Leg: May shower with protection. Wound #6 Right,Anterior Lower Leg: May shower with protection. Wound #7 Right T Second: oe May shower with protection. Primary Wound Dressing: Wound #1 Right,Distal,Posterior Lower Leg: Calcium Alginate with Silver Wound #2 Right,Proximal,Posterior Lower Leg: Calcium Alginate with Silver Wound #3 Right,Lateral Lower Leg: Calcium Alginate with Silver Wound #4 Right,Proximal,Lateral Lower Leg: Calcium Alginate with Silver Wound #5 Right,Distal,Lateral Lower Leg: Calcium Alginate with Silver Wound #6 Right,Anterior Lower Leg: Calcium Alginate with Silver Wound #7 Right T Second: oe Calcium Alginate with Silver Secondary Dressing: Wound #1 Right,Distal,Posterior Lower Leg: Dry Gauze ABD pad Wound #2 Right,Proximal,Posterior Lower Leg: Dry Gauze ABD pad Wound #3 Right,Lateral Lower Leg: Dry Gauze ABD pad Wound #4 Right,Proximal,Lateral Lower Leg: Dry Gauze ABD pad Wound #5 Right,Distal,Lateral Lower Leg: Dry Gauze ABD pad Wound #6 Right,Anterior Lower Leg: Dry Gauze ABD pad Wound #7 Right T Second: oe Kerlix/Rolled Gauze - secure with tape or netting Dry Gauze Edema Control: Wound #1 Right,Distal,Posterior Lower Leg: 3 Layer Compression System - Right Lower Extremity Wound #2 Right,Proximal,Posterior Lower Leg: 3 Layer Compression System - Right Lower Extremity Wound #3 Right,Lateral Lower Leg: 3 Layer Compression System - Right Lower Extremity Wound #4 Right,Proximal,Lateral Lower Leg: 3 Layer Compression System - Right Lower Extremity Wound #5 Right,Distal,Lateral Lower Leg: 3 Layer Compression System - Right Lower Extremity Wound #6 Right,Anterior Lower Leg: 3 Layer Compression System - Right Lower Extremity Radiology ordered were: X-ray, foot right - right foot / 2nd toe, non healing wound with swelling and pain The following medication(s) was prescribed: cephalexin oral 500 mg capsule 1 capsule oral q6h for 10 days  for celluitis right leg, right foot infection starting 11/20/2019 1. We applied silver alginate to the wounds on his legs right first and second toe 2. Put him in 3 layer compression gauze and kerlix on the toes 3. I suspect these wounds started as abscesses on his legs he has a history of this. 4. Fairly consistent recent cultures at different areas of methicillin sensitive staph  aureus and group B strep I therefore put him on cephalexin 500 every 6 for 10 days #40 5. Poorly controlled diabetes no doubt contributing to this 6. X-ray of the left foot I am concerned about osteomyelitis in the left second toe Electronic Signature(s) Signed: 11/22/2019 12:52:55 PM By: Linton Ham MD Entered By: Linton Ham on 11/20/2019 15:43:28 -------------------------------------------------------------------------------- HxROS Details Patient Name: Date of Service: [place] [name] 11/20/2019 1:15 PM Medical Record Number: [id number] Patient Account Number: [id number] Date of Birth/Sex: Treating RN: Jun 06, 1971 (49 y.o. [name] Primary Care Provider: PA [name], [place] Other Clinician: Referring Provider: Treating Provider/Extender: [name] in Treatment: 0 Information Obtained From Patient Constitutional Symptoms (General Health) Complaints and Symptoms: Negative for: Fatigue; Fever; Chills; Marked Weight Change Eyes Complaints and Symptoms: Positive for: Glasses / Contacts - reading Negative for: Dry Eyes; Vision Changes Medical History: Negative for: Cataracts; Glaucoma; Optic Neuritis Ear/Nose/Mouth/Throat Complaints and Symptoms: Negative for: Chronic sinus problems or rhinitis Medical History: Negative for: Chronic sinus problems/congestion; Middle ear problems Respiratory Complaints and Symptoms: Negative for: Chronic or frequent coughs; Shortness of Breath Cardiovascular Complaints and Symptoms: Negative for: Chest pain Gastrointestinal Complaints  and Symptoms: Negative for: Frequent diarrhea; Nausea; Vomiting Endocrine Complaints and Symptoms: Negative for: Heat/cold intolerance Medical History: Positive for: Type II Diabetes Negative for: Type I Diabetes Time with diabetes: 13 yrs Treated with: Insulin Blood sugar tested every day: Yes Tested : once Blood sugar testing results: Breakfast: 170 Genitourinary Complaints and Symptoms: Negative for: Frequent urination Medical History: Negative for: End Stage Renal Disease Integumentary (Skin) Complaints and Symptoms: Positive for: Wounds - right lower leg Medical History: Negative for: History of Burn Musculoskeletal Complaints and Symptoms: Negative for: Muscle Pain; Muscle Weakness Neurologic Complaints and Symptoms: Positive for: Numbness/parasthesias - both feet Medical History: Positive for: Neuropathy Psychiatric Complaints and Symptoms: Negative for: Claustrophobia; Suicidal Medical History: Negative for: Anorexia/bulimia; Confinement Anxiety Hematologic/Lymphatic Immunological Oncologic Medical History: Negative for: Received Chemotherapy; Received Radiation Immunizations Pneumococcal Vaccine: Received Pneumococcal Vaccination: No Implantable Devices None Hospitalization / Surgery History Type of Hospitalization/Surgery IandD abscess right flank scrotum exploration Family and Social History Cancer: No; Diabetes: Yes - Mother; Heart Disease: No; Hereditary Spherocytosis: No; Hypertension: Yes - Mother; Kidney Disease: No; Lung Disease: No; Seizures: No; Stroke: No; Thyroid Problems: No; Tuberculosis: No; Current every day smoker - 1/2 ppd; Marital Status - Single; Alcohol Use: Never; Drug Use: No History; Caffeine Use: Daily - soda, tea; Financial Concerns: No; Food, Clothing or Shelter Needs: No; Support System Lacking: No; Transportation Concerns: No Engineer, maintenance) Signed: 11/20/2019 5:27:28 PM By: Baruch Gouty RN, [name] Signed:  11/22/2019 12:52:55 PM By: Linton Ham MD Signed: 11/22/2019 12:52:55 PM By: Linton Ham MD Entered By: Baruch Gouty on 11/20/2019 14:19:48 -------------------------------------------------------------------------------- SuperBill Details Patient Name: Date of Service: [place] [name] NA [place] N S. 11/20/2019 Medical Record Number: [id number] Patient Account Number: [id number] Date of Birth/Sex: Treating RN: April 08, 1971 (49 y.o. [name]) Carlene Coria Primary Care Provider: PA [name], [place] Other Clinician: Referring Provider: Treating Provider/Extender: [name] in Treatment: 0 Diagnosis Coding ICD-10 Codes Code Description E11.621 Type 2 diabetes mellitus with foot ulcer E11.622 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 ulcer L97.818 Non-pressure chronic ulcer of other part of right lower leg with other specified severity L97.514 Non-pressure chronic ulcer of other part of right foot with necrosis of bone E11.42 Type 2 diabetes mellitus with diabetic polyneuropathy Facility Procedures CPT4 Code: 81157262 Description: [facility] VISIT-LEV  3 EST PT Modifier: 25 Quantity: 1 CPT4 Code: 82500370 Description: 48889 - DEB SUBQ TISSUE 20 SQ CM/< ICD-10 Diagnosis Description L97.818 Non-pressure chronic ulcer of other part of right lower leg with other specified Modifier: severity Quantity: 1 CPT4 Code: 16945038 Description: 88280 - DEB SUBQ TISS EA ADDL 20CM ICD-10 Diagnosis Description L97.818 Non-pressure chronic ulcer of other part of right lower leg with other specified Modifier: severity Quantity: 2 Physician Procedures : CPT4 Code Description Modifier [id number] WC PHYS LEVEL 3 NEW PT ICD-10 Diagnosis Description E11.621 Type 2 diabetes mellitus with foot ulcer E11.622 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 ulcer L97.818 Non-pressure chronic ulcer of other part of right  lower leg with other specified severity L97.514 Non-pressure chronic ulcer of other part of right  foot with necrosis of bone Quantity: 1 : [id number] 11042 - WC PHYS SUBQ TISS 20 SQ CM ICD-10 Diagnosis Description L97.818 Non-pressure chronic ulcer of other part of right lower leg with other specified severity Quantity: 1 : [id number] 53748 - WC PHYS SUBQ TISS EA ADDL 20 CM ICD-10 Diagnosis Description L97.818 Non-pressure chronic ulcer of other part of right lower leg with other specified severity Quantity: 2 Electronic Signature(s) Signed: 11/20/2019 5:13:38 PM By: Carlene Coria RN Signed: 11/22/2019 12:52:55 PM By: Linton Ham MD Entered By: Carlene Coria on 11/20/2019 16:52:10

## 2019-11-27 ENCOUNTER — Encounter (HOSPITAL_BASED_OUTPATIENT_CLINIC_OR_DEPARTMENT_OTHER): Payer: Self-pay | Admitting: Internal Medicine

## 2019-11-29 NOTE — Progress Notes (Signed)
Todd Mccoy, Todd Mccoy ([id number]) Visit Report for 11/27/2019 HPI Details Patient Name: Date of Service: MO [name] 11/27/2019 2:45 PM Medical Record Number: [id number] Patient Account Number: [id number] Date of Birth/Sex: Treating RN: 1970/08/28 (49 y.o. [name]) Carlene Coria Primary Care Provider: PA [name], NO Other Clinician: Referring Provider: Treating Provider/Extender: Beverly Gust in Treatment: 1 History of Present Illness HPI Description: ADMISSION 11/20/2019; this is a 49 year old man with poorly controlled type 2 diabetes. Roughly 3 to 4 weeks ago he noted swelling of his leg open wounds with draining sores. This happened fairly suddenly. He was seen in the ER on 11/07/2019 noted to have wounds of the right lower extremity x2 weeks at that point. An x-ray of the area was negative one of the wounds was cultured showing MSSA and group B strep. He was noted to have multiple wounds. He was given a course of doxycycline for 7 days white count was 8.4 hemoglobin 9.7. Since then he has been washing the leg and applying dry gauze. He is here in the clinic for review of this. 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 At that point his culture was MRSA. No no 11/27/19-Patient returns after starting in the clinic for open leg wounds on his right and his right second toe. We have been applying silver alginate and 3 layer compression on the right. Patient completed course of doxycycline. The wounds are looking slightly smaller, the right posterior wound appears to have healed Electronic Signature(s) Signed: 11/27/2019 4:17:35 PM By: Tobi Bastos MD, MBA Entered By: Tobi Bastos on 11/27/2019 16:17:35 -------------------------------------------------------------------------------- Physical Exam Details Patient Name: Date of Service: MO  [name] 11/27/2019 2:45 PM Medical Record Number: [id number] Patient Account Number: [id number] Date of Birth/Sex: Treating RN: 06/22/1971 (49 y.o. [name]) Carlene Coria Primary Care Provider: PA [name], NO Other Clinician: Referring Provider: Treating Provider/Extender: Beverly Gust in Treatment: 1 Constitutional alert and oriented x 3. sitting or standing blood pressure is within target range for patient.. supine blood pressure is within target range for patient.. pulse regular and within target range for patient.Marland Kitchen respirations regular, non-labored and within target range for patient.Marland Kitchen temperature within target range for patient.. . . Well- nourished and well-hydrated in no acute distress. Notes Multiple wounds of the right leg mostly in the anterior and lateral surfaces that have a clean base, right second toe wound is small and also clean, surrounding skin with edema Electronic Signature(s) Signed: 11/27/2019 4:18:05 PM By: Tobi Bastos MD, MBA Entered By: Tobi Bastos on 11/27/2019 16:18:04 -------------------------------------------------------------------------------- Physician Orders Details Patient Name: Date of Service: MO [name] NA [place] N S. 11/27/2019 2:45 PM Medical Record Number: [id number] Patient Account Number: [id number] Date of Birth/Sex: Treating RN: 09/08/1970 (49 y.o. 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 Nurse Visit: -  1 week Dressing Change Frequency Wound #1 Right,Distal,Posterior Lower Leg Do not change entire dressing for one week. Wound #3 Right,Lateral Lower Leg Do not change entire dressing for one week. Wound #4 Right,Proximal,Lateral Lower Leg Do not change entire dressing for one week. Wound #5 Right,Distal,Lateral Lower Leg Do not change entire dressing for one week. Wound #6 Right,Anterior Lower Leg Do not change entire dressing for one week. Wound #7 Right T Second oe Change Dressing every other day. Skin Barriers/Peri-Wound Care Moisturizing lotion Wound Cleansing Wound #1 Right,Distal,Posterior Lower Leg May shower with protection. Wound #3 Right,Lateral Lower Leg May shower with protection. Wound #4 Right,Proximal,Lateral Lower Leg May shower with protection. Wound #5 Right,Distal,Lateral Lower Leg May shower with protection. Wound #6 Right,Anterior Lower Leg May shower with protection. Wound #7 Right T Second oe May shower with protection. Primary Wound Dressing Wound #1 Right,Distal,Posterior Lower Leg Calcium Alginate with Silver Wound #3 Right,Lateral Lower Leg Calcium Alginate with Silver Wound #4 Right,Proximal,Lateral Lower Leg Calcium Alginate with Silver Wound #5 Right,Distal,Lateral Lower Leg Calcium Alginate with Silver Wound #6 Right,Anterior Lower Leg Calcium Alginate with Silver Wound #7 Right T Second oe Calcium Alginate with Silver Secondary Dressing Wound #1 Right,Distal,Posterior Lower Leg Dry Gauze ABD pad Wound #3 Right,Lateral Lower Leg Dry Gauze ABD pad Wound #4 Right,Proximal,Lateral Lower Leg Dry Gauze ABD pad Wound #5 Right,Distal,Lateral Lower Leg Dry Gauze ABD pad Wound #6 Right,Anterior Lower Leg Dry Gauze ABD pad Wound #7 Right T Second oe Kerlix/Rolled Gauze - secure with tape or netting Dry Gauze Edema Control Wound #1  Right,Distal,Posterior Lower Leg 3 Layer Compression System - Right Lower Extremity Wound #3 Right,Lateral Lower Leg 3 Layer Compression System - Right Lower Extremity Wound #4 Right,Proximal,Lateral Lower Leg 3 Layer Compression System - Right Lower Extremity Wound #5 Right,Distal,Lateral Lower Leg 3 Layer Compression System - Right Lower Extremity Wound #6 Right,Anterior Lower Leg 3 Layer Compression System - Right Lower Extremity Electronic Signature(s) Signed: 11/27/2019 5:17:39 PM By: Carlene Coria RN Signed: 11/29/2019 4:57:23 PM By: Tobi Bastos MD, MBA Entered By: Carlene Coria on 11/27/2019 16:16:42 -------------------------------------------------------------------------------- Problem List Details Patient Name: Date of Service: [place] [name] NA [place] N S. 11/27/2019 2:45 PM Medical Record Number: [id number] Patient Account Number: [id number] Date of Birth/Sex: Treating RN: 1971-01-12 (49 y.o. [name]) Carlene Coria Primary Care Provider: PA [name], NO Other Clinician: Referring Provider: Treating Provider/Extender: Beverly Gust in Treatment: 1 Active Problems ICD-10 Encounter Code Description Active Date MDM Diagnosis E11.621 Type 2 diabetes mellitus with foot ulcer 11/20/2019 No Yes E11.622 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 ulcer 11/20/2019 No Yes L97.818 Non-pressure chronic ulcer of other part of right lower leg with other specified 11/20/2019 No Yes severity L97.514 Non-pressure chronic ulcer of other part of right foot with necrosis of bone 11/20/2019 No Yes E11.42 Type 2 diabetes mellitus with diabetic polyneuropathy 11/20/2019 No Yes Inactive Problems Resolved Problems Electronic Signature(s) Signed: 11/27/2019 5:17:39 PM By: Carlene Coria RN Signed: 11/29/2019 4:57:23 PM By: Tobi Bastos MD, MBA Entered By: Carlene Coria on 11/27/2019 15:04:59 -------------------------------------------------------------------------------- Progress Note Details Patient  Name: Date of Service: MO [name] NA [place] N S. 11/27/2019 2:45 PM Medical Record Number: [id number] Patient Account Number: [id number] Date of Birth/Sex: Treating RN: [birth year] (49 y.o. [name]) Carlene Coria Primary Care Provider: PA [name], NO Other Clinician: Referring Provider: Treating Provider/Extender: Beverly Gust in Treatment: 1 Subjective History of Present Illness (HPI) ADMISSION 11/20/2019; this is a 49 year old man with poorly controlled type 2  diabetes. Roughly 3 to 4 weeks ago he noted swelling of his leg open wounds with draining sores. This happened fairly suddenly. He was seen in the ER on 11/07/2019 noted to have wounds of the right lower extremity x2 weeks at that point. An x-ray of the area was negative one of the wounds was cultured showing MSSA and group B strep. He was noted to have multiple wounds. He was given a course of doxycycline for 7 days white count was 8.4 hemoglobin 9.7. Since then he has been washing the leg and applying dry gauze. He is here in the clinic for review of this. 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 At that point his culture was MRSA. No no 11/27/19-Patient returns after starting in the clinic for open leg wounds on his right and his right second toe. We have been applying silver alginate and 3 layer compression on the right. Patient completed course of doxycycline. The wounds are looking slightly smaller, the right posterior wound appears to have healed Objective Constitutional alert and oriented x 3. sitting or standing blood pressure is within target range for patient.. supine blood pressure is within target range for patient.. pulse regular and within target range for patient.Marland Kitchen respirations regular, non-labored and within target range for patient.Marland Kitchen temperature within target range for patient..  Well- nourished and well-hydrated in no acute distress. Vitals Time Taken: 3:16 PM, Height: 70 in, Weight: 185 lbs, BMI: 26.5, Temperature: 98.2 F, Pulse: 99 bpm, Respiratory Rate: 18 breaths/min, Blood Pressure: 159/93 mmHg. General Notes: Multiple wounds of the right leg mostly in the anterior and lateral surfaces that have a clean base, right second toe wound is small and also clean, surrounding skin with edema Integumentary (Hair, Skin) Wound #1 status is Open. Original cause of wound was Gradually Appeared. The wound is located on the Right,Distal,Posterior Lower Leg. The wound measures 5cm length x 2.4cm width x 0.1cm depth; 9.425cm^2 area and 0.942cm^3 volume. There is Fat Layer (Subcutaneous Tissue) Exposed exposed. There is no tunneling or undermining noted. There is a medium amount of serosanguineous drainage noted. The wound margin is flat and intact. There is small (1-33%) red granulation within the wound bed. There is a large (67-100%) amount of necrotic tissue within the wound bed including Adherent Slough. Wound #2 status is Healed - Epithelialized. Original cause of wound was Gradually Appeared. The wound is located on the Right,Proximal,Posterior Lower Leg. The wound measures 0cm length x 0cm width x 0cm depth; 0cm^2 area and 0cm^3 volume. There is no tunneling or undermining noted. There is a none present amount of drainage noted. The wound margin is distinct with the outline attached to the wound base. There is no granulation within the wound bed. There is no necrotic tissue within the wound bed. Wound #3 status is Open. Original cause of wound was Gradually Appeared. The wound is located on the Right,Lateral Lower Leg. The wound measures 1.7cm length x 1.1cm width x 0.1cm depth; 1.469cm^2 area and 0.147cm^3 volume. There is Fat Layer (Subcutaneous Tissue) Exposed exposed. There is no tunneling or undermining noted. There is a medium amount of serous drainage noted. The wound  margin is flat and intact. There is small (1-33%) red granulation within the wound bed. There is a large (67-100%) amount of necrotic tissue within the wound bed including Adherent Slough. Wound #4 status  is Open. Original cause of wound was Gradually Appeared. The wound is located on the Right,Proximal,Lateral Lower Leg. The wound measures 2cm length x 1.3cm width x 0.1cm depth; 2.042cm^2 area and 0.204cm^3 volume. There is Fat Layer (Subcutaneous Tissue) Exposed exposed. There is no tunneling or undermining noted. There is a medium amount of serous drainage noted. The wound margin is flat and intact. There is medium (34-66%) granulation within the wound bed. There is a medium (34-66%) amount of necrotic tissue within the wound bed including Eschar and Adherent Slough. Wound #5 status is Open. Original cause of wound was Gradually Appeared. The wound is located on the Right,Distal,Lateral Lower Leg. The wound measures 2cm length x 1.7cm width x 0.1cm depth; 2.67cm^2 area and 0.267cm^3 volume. There is Fat Layer (Subcutaneous Tissue) Exposed exposed. There is no tunneling or undermining noted. There is a medium amount of serous drainage noted. The wound margin is flat and intact. There is small (1-33%) red granulation within the wound bed. There is a large (67-100%) amount of necrotic tissue within the wound bed including Eschar and Adherent Slough. Wound #6 status is Open. Original cause of wound was Gradually Appeared. The wound is located on the Right,Anterior Lower Leg. The wound measures 3.5cm length x 3cm width x 0.1cm depth; 8.247cm^2 area and 0.825cm^3 volume. There is Fat Layer (Subcutaneous Tissue) Exposed exposed. There is no tunneling or undermining noted. There is a medium amount of serosanguineous drainage noted. The wound margin is flat and intact. There is medium (34-66%) pink granulation within the wound bed. There is a medium (34-66%) amount of necrotic tissue within the wound bed  including Adherent Slough. Wound #7 status is Open. Original cause of wound was Trauma. The wound is located on the Right T Second. The wound measures 0.2cm length x 0.2cm oe width x 0.1cm depth; 0.031cm^2 area and 0.003cm^3 volume. There is Fat Layer (Subcutaneous Tissue) Exposed exposed. There is no tunneling or undermining noted. There is a small amount of serous drainage noted. The wound margin is well defined and not attached to the wound base. There is large (67-100%) pink granulation within the wound bed. There is no necrotic tissue within the wound bed. 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 There was a Three Layer Compression Therapy Procedure by Carlene Coria, RN. Post procedure Diagnosis Wound #1: Same as Pre-Procedure Wound #3 Pre-procedure diagnosis of Wound #3 is a Diabetic Wound/Ulcer of the Lower Extremity located on the Right,Lateral Lower Leg . There was a Three Layer Compression Therapy Procedure by Carlene Coria, RN. Post procedure Diagnosis Wound #3: Same as Pre-Procedure Wound #4 Pre-procedure diagnosis of Wound #4 is a Diabetic Wound/Ulcer of the Lower Extremity located on the Right,Proximal,Lateral Lower Leg . There was a Three Layer Compression Therapy Procedure by Carlene Coria, RN. Post procedure Diagnosis Wound #4: Same as Pre-Procedure Wound #5 Pre-procedure diagnosis of Wound #5 is a Diabetic Wound/Ulcer of the Lower Extremity located on the Right,Distal,Lateral Lower Leg . There was a Three Layer Compression  Therapy Procedure by Carlene Coria, RN. Post procedure Diagnosis Wound #5: Same as Pre-Procedure Wound #  6 Pre-procedure diagnosis of Wound #6 is a Diabetic Wound/Ulcer of the Lower Extremity located on the Right,Anterior Lower Leg . There was a Three Layer Compression Therapy Procedure by Carlene Coria, RN. Post procedure Diagnosis Wound #6: Same as Pre-Procedure Plan Follow-up Appointments: Return Appointment in 2 weeks. Nurse Visit: - 1 week Dressing Change Frequency: Wound #1 Right,Distal,Posterior Lower Leg: Do not change entire dressing for one week. Wound #3 Right,Lateral Lower Leg: Do not change entire dressing for one week. Wound #4 Right,Proximal,Lateral Lower Leg: Do not change entire dressing for one week. Wound #5 Right,Distal,Lateral Lower Leg: Do not change entire dressing for one week. Wound #6 Right,Anterior Lower Leg: Do not change entire dressing for one week. Wound #7 Right T Second: oe Change Dressing every other day. Skin Barriers/Peri-Wound Care: Moisturizing lotion Wound Cleansing: Wound #1 Right,Distal,Posterior Lower Leg: May shower with protection. Wound #3 Right,Lateral Lower Leg: May shower with protection. Wound #4 Right,Proximal,Lateral Lower Leg: May shower with protection. Wound #5 Right,Distal,Lateral Lower Leg: May shower with protection. Wound #6 Right,Anterior Lower Leg: May shower with protection. Wound #7 Right T Second: oe May shower with protection. Primary Wound Dressing: Wound #1 Right,Distal,Posterior Lower Leg: Calcium Alginate with Silver Wound #3 Right,Lateral Lower Leg: Calcium Alginate with Silver Wound #4 Right,Proximal,Lateral Lower Leg: Calcium Alginate with Silver Wound #5 Right,Distal,Lateral Lower Leg: Calcium Alginate with Silver Wound #6 Right,Anterior Lower Leg: Calcium Alginate with Silver Wound #7 Right T Second: oe Calcium Alginate with Silver Secondary Dressing: Wound #1 Right,Distal,Posterior Lower  Leg: Dry Gauze ABD pad Wound #3 Right,Lateral Lower Leg: Dry Gauze ABD pad Wound #4 Right,Proximal,Lateral Lower Leg: Dry Gauze ABD pad Wound #5 Right,Distal,Lateral Lower Leg: Dry Gauze ABD pad Wound #6 Right,Anterior Lower Leg: Dry Gauze ABD pad Wound #7 Right T Second: oe Kerlix/Rolled Gauze - secure with tape or netting Dry Gauze Edema Control: Wound #1 Right,Distal,Posterior Lower Leg: 3 Layer Compression System - Right Lower Extremity Wound #3 Right,Lateral Lower Leg: 3 Layer Compression System - Right Lower Extremity Wound #4 Right,Proximal,Lateral Lower Leg: 3 Layer Compression System - Right Lower Extremity Wound #5 Right,Distal,Lateral Lower Leg: 3 Layer Compression System - Right Lower Extremity Wound #6 Right,Anterior Lower Leg: 3 Layer Compression System - Right Lower Extremity -Continue silver alginate with 3 layer compression -Continue to elevate leg as much as possible -Resume clinic visits in 2 weeks Electronic Signature(s) Signed: 11/27/2019 4:18:38 PM By: Tobi Bastos MD, MBA Entered By: Tobi Bastos on 11/27/2019 16:18:38 -------------------------------------------------------------------------------- SuperBill Details Patient Name: Date of Service: MO [name] 11/27/2019 Medical Record Number: [id number] Patient Account Number: [id number] Date of Birth/Sex: Treating RN: [birth year] (49 y.o. 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

## 2019-12-04 ENCOUNTER — Other Ambulatory Visit: Payer: Self-pay

## 2019-12-04 ENCOUNTER — Encounter (HOSPITAL_BASED_OUTPATIENT_CLINIC_OR_DEPARTMENT_OTHER): Payer: Self-pay | Attending: Internal Medicine | Admitting: Internal Medicine

## 2019-12-04 DIAGNOSIS — L97514 Non-pressure chronic ulcer of other part of right foot with necrosis of bone: Secondary | ICD-10-CM | POA: Insufficient documentation

## 2019-12-04 DIAGNOSIS — E11622 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 ulcer: Secondary | ICD-10-CM | POA: Insufficient documentation

## 2019-12-04 DIAGNOSIS — E1142 Type 2 diabetes mellitus with diabetic polyneuropathy: Secondary | ICD-10-CM | POA: Insufficient documentation

## 2019-12-04 DIAGNOSIS — E11621 Type 2 diabetes mellitus with foot ulcer: Secondary | ICD-10-CM | POA: Insufficient documentation

## 2019-12-04 DIAGNOSIS — E1151 Type 2 diabetes mellitus with diabetic peripheral angiopathy without gangrene: Secondary | ICD-10-CM | POA: Insufficient documentation

## 2019-12-04 DIAGNOSIS — I10 Essential (primary) hypertension: Secondary | ICD-10-CM | POA: Insufficient documentation

## 2019-12-04 DIAGNOSIS — L97818 Non-pressure chronic ulcer of other part of right lower leg with other specified severity: Secondary | ICD-10-CM | POA: Insufficient documentation

## 2019-12-04 NOTE — Progress Notes (Signed)
Todd Mccoy, Todd Mccoy ([id number]) Visit Report for 12/04/2019 SuperBill Details Patient Name: Date of Service: [place] [name] 12/04/2019 Medical Record Number: [id number] Patient Account Number: [id number] Date of Birth/Sex: Treating RN: 1970/07/18 (49 y.o. 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

## 2019-12-04 NOTE — Progress Notes (Signed)
Todd, Mccoy ([id number]) Visit Report for 12/04/2019 Arrival Information Details Patient Name: Date of Service: [name] 12/04/2019 1:45 PM Medical Record Number: [id number] Patient Account Number: [id number] Date of Birth/Sex: Treating RN: 31-Jul-1970 (49 y.o. Todd Mccoy Primary Care Katye Valek: PA [name], NO Other Clinician: Referring Nedra Mcinnis: Treating Elzy Tomasello/Extender: [name] in Treatment: 2 Visit Information History Since Last Visit Added or deleted any medications: No Patient Arrived: Ambulatory Any new allergies or adverse reactions: No Arrival Time: 14:00 Had a fall or experienced change in No Accompanied By: self activities of daily living that may affect Transfer Assistance: None risk of falls: Patient Identification Verified: Yes Signs or symptoms of abuse/neglect since last visito No Secondary Verification Process Completed: Yes Hospitalized since last visit: No Patient Requires Transmission-Based Precautions: No Implantable device outside of the clinic excluding No Patient Has Alerts: No cellular tissue based products placed in the center since last visit: Has Dressing in Place as Prescribed: Yes Has Compression in Place as Prescribed: Yes Pain Present Now: No Electronic Signature(s) Signed: 12/04/2019 5:56:12 PM By: Deon Pilling Entered By: Deon Pilling on 12/04/2019 14:46:04 -------------------------------------------------------------------------------- Compression Therapy Details Patient Name: Date of Service: [name] 12/04/2019 1:45 PM Medical Record Number: [id number] Patient Account Number: [id number] Date of Birth/Sex: Treating RN: [birth year] (49 y.o. Todd Mccoy Primary Care Pallas Wahlert: PA [name], NO Other Clinician: Referring Kolin Erdahl: Treating Caileigh Canche/Extender: [name] in Treatment: 2 Compression Therapy Performed for Wound Assessment: Wound #1 Right,Distal,Posterior Lower  Leg Performed By: Clinician Deon Pilling, RN Compression Type: Three Layer Pre Treatment ABI: 1.1 Electronic Signature(s) Signed: 12/04/2019 5:56:12 PM By: Deon Pilling Entered By: Deon Pilling on 12/04/2019 14:49:19 -------------------------------------------------------------------------------- Compression Therapy Details Patient Name: Date of Service: [name] 12/04/2019 1:45 PM Medical Record Number: [id number] Patient Account Number: [id number] Date of Birth/Sex: Treating RN: 1971-02-25 (49 y.o. Todd Mccoy Primary Care Alane Hanssen: PA [name], NO Other Clinician: Referring Khye Hochstetler: Treating [name]/Extender: [name] in Treatment: 2 Compression Therapy Performed for Wound Assessment: Wound #3 Right,Lateral Lower Leg Performed By: Clinician Deon Pilling, RN Compression Type: Three Layer Pre Treatment ABI: 1.1 Electronic Signature(s) Signed: 12/04/2019 5:56:12 PM By: Deon Pilling Entered By: Deon Pilling on 12/04/2019 14:49:19 -------------------------------------------------------------------------------- Compression Therapy Details Patient Name: Date of Service: [name] 12/04/2019 1:45 PM Medical Record Number: [id number] Patient Account Number: [id number] Date of Birth/Sex: Treating RN: 06-02-1971 (49 y.o. Todd Mccoy Primary Care Todd Mccoy: PA [name], NO Other Clinician: Referring Shenita Trego: Treating Todd Mccoy/Extender: [name] in Treatment: 2 Compression Therapy Performed for Wound Assessment: Wound #4 Right,Proximal,Lateral Lower Leg Performed By: Clinician Deon Pilling, RN Compression Type: Three Layer Pre Treatment ABI: 1.1 Electronic Signature(s) Signed: 12/04/2019 5:56:12 PM By: Deon Pilling Entered By: Deon Pilling on 12/04/2019 14:49:19 -------------------------------------------------------------------------------- Compression Therapy Details Patient Name: Date of Service: [name] 12/04/2019 1:45 PM Medical Record Number: [id number] Patient Account Number: [id number] Date of Birth/Sex: Treating RN: 22-Jul-1970 (49 y.o. Todd Mccoy Primary Care Sohil Timko: PA [name], NO Other Clinician: Referring Zyheir Daft: Treating Todd Mccoy/Extender: [name] in Treatment: 2 Compression Therapy Performed for Wound Assessment: Wound #5 Right,Distal,Lateral Lower Leg Performed By: Clinician Deon Pilling, RN Compression Type: Three Layer Pre Treatment ABI: 1.1 Electronic Signature(s) Signed: 12/04/2019 5:56:12 PM By: Deon Pilling Entered By: Deon Pilling on 12/04/2019 14:49:19 -------------------------------------------------------------------------------- Compression Therapy Details Patient Name: Date of  Service: [name] 12/04/2019 1:45 PM Medical Record Number: [id number] Patient Account Number: [id number] Date of Birth/Sex: Treating RN: 17-Oct-1970 (49 y.o. Todd Mccoy Primary Care Raeqwon Lux: PA [name], NO Other Clinician: Referring Teauna Dubach: Treating Todd Mccoy/Extender: [name] in Treatment: 2 Compression Therapy Performed for Wound Assessment: Wound #6 Right,Anterior Lower Leg Performed By: Clinician Deon Pilling, RN Compression Type: Three Layer Pre Treatment ABI: 1.1 Electronic Signature(s) Signed: 12/04/2019 5:56:12 PM By: Deon Pilling Entered By: Deon Pilling on 12/04/2019 14:49:19 -------------------------------------------------------------------------------- Compression Therapy Details Patient Name: Date of Service: [name] 12/04/2019 1:45 PM Medical Record Number: [id number] Patient Account Number: [id number] Date of Birth/Sex: Treating RN: Jan 08, 1971 (49 y.o. Todd Mccoy Primary Care Todd Mccoy: PA Darnelle Spangle Other Clinician: Referring Kitana Gage: Treating Courtnie Brenes/Extender: [name] in Treatment: 2 Compression Therapy Performed for Wound Assessment: Wound #7 Right  T Second oe Performed By: Clinician Deon Pilling, RN Compression Type: Three Layer Pre Treatment ABI: 1.1 Electronic Signature(s) Signed: 12/04/2019 5:56:12 PM By: Deon Pilling Entered By: Deon Pilling on 12/04/2019 14:49:19 -------------------------------------------------------------------------------- Encounter Discharge Information Details Patient Name: Date of Service: [name] 12/04/2019 1:45 PM Medical Record Number: [id number] Patient Account Number: [id number] Date of Birth/Sex: Treating RN: 1971/01/31 (49 y.o. Todd Mccoy Primary Care Treshaun Carrico: PA [name], NO Other Clinician: Referring Joshawa Dubin: Treating Antaniya Venuti/Extender: [name] in Treatment: 2 Encounter Discharge Information Items Discharge Condition: Stable Ambulatory Status: Ambulatory Discharge Destination: Home Transportation: Private Auto Accompanied By: self Schedule Follow-up Appointment: Yes Clinical Summary of Care: Electronic Signature(s) Signed: 12/04/2019 5:56:12 PM By: Deon Pilling Entered By: Deon Pilling on 12/04/2019 14:50:44 -------------------------------------------------------------------------------- Patient/Caregiver Education Details Patient Name: Date of Service: [name] NA THA Robb Matar 6/1/2021andnbsp1:45 PM Medical Record Number: [id number] Patient Account Number: [id number] Date of Birth/Gender: Treating RN: Oct 17, 1970 (49 y.o. Todd Mccoy Primary Care Physician: PA [name], NO Other Clinician: Referring Physician: Treating Physician/Extender: [name] in Treatment: 2 Education Assessment Education Provided To: Patient Education Topics Provided Wound/Skin Impairment: Handouts: Skin Care Do's and Dont's Methods: Explain/Verbal Responses: Reinforcements needed Electronic Signature(s) Signed: 12/04/2019 5:56:12 PM By: Deon Pilling Entered By: Deon Pilling on 12/04/2019  14:50:37 -------------------------------------------------------------------------------- Wound Assessment Details Patient Name: Date of Service: [name] 12/04/2019 1:45 PM Medical Record Number: [id number] Patient Account Number: [id number] Date of Birth/Sex: Treating RN: May 12, 1971 (49 y.o. Todd Mccoy Primary Care Bexley Laubach: PA TIENT, NO Other Clinician: Referring Early Steel: Treating Britain Anagnos/Extender: [name] in Treatment: 2 Wound Status Wound Number: 1 Primary Etiology: Diabetic Wound/Ulcer of the Lower Extremity Wound Location: Right, Distal, Posterior Lower Leg Secondary Etiology: Infection - not elsewhere classified Wounding Event: Gradually Appeared Wound Status: Open Date Acquired: 10/24/2019 Weeks Of Treatment: 2 Clustered Wound: No Wound Measurements Length: (cm) 5 Width: (cm) 2.4 Depth: (cm) 0.1 Area: (cm) 9.425 Volume: (cm) 0.942 % Reduction in Area: 42.9% % Reduction in Volume: 42.9% Wound Description Classification: Grade 1 Treatment Notes Wound #1 (Right, Distal, Posterior Lower Leg) 1. Cleanse With Wound Cleanser Soap and water 2. Periwound Care Moisturizing lotion 3. Primary Dressing Applied Calcium Alginate Ag 4. Secondary Dressing ABD Pad Dry Gauze 6. Support Layer Applied 3 layer compression wrap Notes netting. Electronic Signature(s) Signed: 12/04/2019 5:56:12 PM By: Deon Pilling Entered By: Deon Pilling on 12/04/2019 14:48:03 -------------------------------------------------------------------------------- Wound Assessment Details Patient Name: Date of Service: [name] 12/04/2019 1:45  PM Medical Record Number: [id number] Patient Account Number: [id number] Date of Birth/Sex: Treating RN: [birth year] (49 y.o. Todd Mccoy Primary Care Brightyn Mozer: PA TIENT, NO Other Clinician: Referring Quin Mcpherson: Treating Kealohilani Maiorino/Extender: [name] in Treatment: 2 Wound Status Wound  Number: 3 Primary Etiology: Diabetic Wound/Ulcer of the Lower Extremity Wound Location: Right, Lateral Lower Leg Secondary Etiology: Infection - not elsewhere classified Wounding Event: Gradually Appeared Wound Status: Open Date Acquired: 10/24/2019 Weeks Of Treatment: 2 Clustered Wound: No Wound Measurements Length: (cm) 1.7 Width: (cm) 1.1 Depth: (cm) 0.1 Area: (cm) 1.469 Volume: (cm) 0.147 % Reduction in Area: -3.9% % Reduction in Volume: -4.3% Wound Description Classification: Grade 1 Treatment Notes Wound #3 (Right, Lateral Lower Leg) 1. Cleanse With Wound Cleanser Soap and water 2. Periwound Care Moisturizing lotion 3. Primary Dressing Applied Calcium Alginate Ag 4. Secondary Dressing ABD Pad Dry Gauze 6. Support Layer Applied 3 layer compression wrap Notes netting. Electronic Signature(s) Signed: 12/04/2019 5:56:12 PM By: Deon Pilling Entered By: Deon Pilling on 12/04/2019 14:48:03 -------------------------------------------------------------------------------- Wound Assessment Details Patient Name: Date of Service: [name] 12/04/2019 1:45 PM Medical Record Number: [id number] Patient Account Number: [id number] Date of Birth/Sex: Treating RN: [birth year] (49 y.o. Todd Mccoy Primary Care Shamari Lofquist: PA TIENT, NO Other Clinician: Referring Damin Salido: Treating Mineola Duan/Extender: [name] in Treatment: 2 Wound Status Wound Number: 4 Primary Etiology: Diabetic Wound/Ulcer of the Lower Extremity Wound Location: Right, Proximal, Lateral Lower Leg Secondary Etiology: Infection - not elsewhere classified Wounding Event: Gradually Appeared Wound Status: Open Date Acquired: 10/24/2019 Weeks Of Treatment: 2 Clustered Wound: No Wound Measurements Length: (cm) 2 Width: (cm) 1.3 Depth: (cm) 0.1 Area: (cm) 2.042 Volume: (cm) 0.204 % Reduction in Area: 13.3% % Reduction in Volume: 13.6% Wound Description Classification: Grade  1 Treatment Notes Wound #4 (Right, Proximal, Lateral Lower Leg) 1. Cleanse With Wound Cleanser Soap and water 2. Periwound Care Moisturizing lotion 3. Primary Dressing Applied Calcium Alginate Ag 4. Secondary Dressing ABD Pad Dry Gauze 6. Support Layer Applied 3 layer compression wrap Notes netting. Electronic Signature(s) Signed: 12/04/2019 5:56:12 PM By: Deon Pilling Entered By: Deon Pilling on 12/04/2019 14:48:03 -------------------------------------------------------------------------------- Wound Assessment Details Patient Name: Date of Service: [name] 12/04/2019 1:45 PM Medical Record Number: [id number] Patient Account Number: [id number] Date of Birth/Sex: Treating RN: 12-19-1970 (49 y.o. Todd Mccoy Primary Care Dynasty Holquin: PA TIENT, NO Other Clinician: Referring Kevonta Phariss: Treating Khrystyna Schwalm/Extender: [name] in Treatment: 2 Wound Status Wound Number: 5 Primary Etiology: Diabetic Wound/Ulcer of the Lower Extremity Wound Location: Right, Distal, Lateral Lower Leg Secondary Etiology: Infection - not elsewhere classified Wounding Event: Gradually Appeared Wound Status: Open Date Acquired: 10/24/2019 Weeks Of Treatment: 2 Clustered Wound: No Wound Measurements Length: (cm) 2 Width: (cm) 1.7 Depth: (cm) 0.1 Area: (cm) 2.67 Volume: (cm) 0.267 % Reduction in Area: 37% % Reduction in Volume: 37% Wound Description Classification: Grade 1 Treatment Notes Wound #5 (Right, Distal, Lateral Lower Leg) 1. Cleanse With Wound Cleanser Soap and water 2. Periwound Care Moisturizing lotion 3. Primary Dressing Applied Calcium Alginate Ag 4. Secondary Dressing ABD Pad Dry Gauze 6. Support Layer Applied 3 layer compression wrap Notes netting. Electronic Signature(s) Signed: 12/04/2019 5:56:12 PM By: Deon Pilling Entered By: Deon Pilling on 12/04/2019  14:48:04 -------------------------------------------------------------------------------- Wound Assessment Details Patient Name: Date of Service: [name] 12/04/2019 1:45 PM Medical Record Number: [id number] Patient Account Number: [id number] Date of Birth/Sex: Treating RN:  02/28/1971 (49 y.o. Todd Mccoy Primary Care Akeela Busk: PA TIENT, NO Other Clinician: Referring Damali Broadfoot: Treating Wai Minotti/Extender: [name] in Treatment: 2 Wound Status Wound Number: 6 Primary Etiology: Diabetic Wound/Ulcer of the Lower Extremity Wound Location: Right, Anterior Lower Leg Secondary Etiology: Infection - not elsewhere classified Wounding Event: Gradually Appeared Wound Status: Open Date Acquired: 10/24/2019 Weeks Of Treatment: 2 Clustered Wound: No Wound Measurements Length: (cm) 3.5 Width: (cm) 3 Depth: (cm) 0.1 Area: (cm) 8.247 Volume: (cm) 0.825 % Reduction in Area: 18% % Reduction in Volume: 17.9% Wound Description Classification: Grade 1 Treatment Notes Wound #6 (Right, Anterior Lower Leg) 1. Cleanse With Wound Cleanser Soap and water 2. Periwound Care Moisturizing lotion 3. Primary Dressing Applied Calcium Alginate Ag 4. Secondary Dressing ABD Pad Dry Gauze 6. Support Layer Applied 3 layer compression wrap Notes netting. Electronic Signature(s) Signed: 12/04/2019 5:56:12 PM By: Deon Pilling Entered By: Deon Pilling on 12/04/2019 14:48:04 -------------------------------------------------------------------------------- Wound Assessment Details Patient Name: Date of Service: [name] 12/04/2019 1:45 PM Medical Record Number: [id number] Patient Account Number: [id number] Date of Birth/Sex: Treating RN: 10-Jul-1970 (49 y.o. Todd Mccoy Primary Care Naraly Fritcher: PA TIENT, NO Other Clinician: Referring Spenser Harren: Treating Gordan Grell/Extender: [name] in Treatment: 2 Wound Status Wound Number: 7 Primary  Etiology: Diabetic Wound/Ulcer of the Lower Extremity Wound Location: Right T Second oe Wound Status: Open Wounding Event: Trauma Date Acquired: 10/10/2019 Weeks Of Treatment: 2 Clustered Wound: No Wound Measurements Length: (cm) 0.2 Width: (cm) 0.2 Depth: (cm) 0.1 Area: (cm) 0.031 Volume: (cm) 0.003 % Reduction in Area: 50.8% % Reduction in Volume: 88% Wound Description Classification: Grade 1 Treatment Notes Wound #7 (Right Toe Second) 1. Cleanse With Soap and water 3. Primary Dressing Applied Calcium Alginate Ag 4. Secondary Dressing Other secondary dressing (specify in notes) 7. Footwear/Offloading device applied Surgical shoe Notes bandaid as secondary. Electronic Signature(s) Signed: 12/04/2019 5:56:12 PM By: Deon Pilling Entered By: Deon Pilling on 12/04/2019 14:48:04 -------------------------------------------------------------------------------- Vitals Details Patient Name: Date of Service: [name] 12/04/2019 1:45 PM Medical Record Number: [id number] Patient Account Number: [id number] Date of Birth/Sex: Treating RN: 07/25/1970 (49 y.o. Todd Mccoy Primary Care Brylynn Hanssen: PA [name], NO Other Clinician: Referring Adron Geisel: Treating Jariyah Hackley/Extender: [name] in Treatment: 2 Vital Signs Time Taken: 14:00 Temperature (F): 98.8 Height (in): 70 Pulse (bpm): 108 Weight (lbs): 185 Respiratory Rate (breaths/min): 18 Body Mass Index (BMI): 26.5 Blood Pressure (mmHg): 157/101 Capillary Blood Glucose (mg/dl): 213 Reference Range: 80 - 120 mg / dl Electronic Signature(s) Signed: 12/04/2019 5:56:12 PM By: Deon Pilling Entered By: Deon Pilling on 12/04/2019 14:46:20

## 2019-12-06 NOTE — Progress Notes (Signed)
DUTCH, ING ([id number]) Visit Report for 11/27/2019 Arrival Information Details Patient Name: Date of Service: [name] NA [place] N S. 11/27/2019 2:45 PM Medical Record Number: [id number] Patient Account Number: [id number] Date of Birth/Sex: Treating RN: 1971-03-16 (49 y.o. [name]) Carlene Coria Primary Care Aftin Lye: PA [name], NO Other Clinician: Referring Robyne Matar: Treating Latondra Gebhart/Extender: Beverly Gust in Treatment: 1 Visit Information History Since Last Visit Added or deleted any medications: No Patient Arrived: Ambulatory Any new allergies or adverse reactions: No Arrival Time: 15:15 Had a fall or experienced change in No Accompanied By: self activities of daily living that may affect Transfer Assistance: None risk of falls: Patient Identification Verified: Yes Signs or symptoms of abuse/neglect since last visito No Secondary Verification Process Completed: Yes Hospitalized since last visit: No Patient Requires Transmission-Based Precautions: No Implantable device outside of the clinic excluding No Patient Has Alerts: No cellular tissue based products placed in the center since last visit: Has Dressing in Place as Prescribed: Yes Pain Present Now: Yes Electronic Signature(s) Signed: 12/06/2019 9:13:31 AM By: Sandre Kitty Entered By: Sandre Kitty on 11/27/2019 15:15:45 -------------------------------------------------------------------------------- Compression Therapy Details Patient Name: Date of Service: [name] NA [place] N S. 11/27/2019 2:45 PM Medical Record Number: [id number] Patient Account Number: [id number] Date of Birth/Sex: Treating RN: 12/30/1970 (49 y.o. [name]) Carlene Coria Primary Care Graycen Sadlon: PA [name], NO Other Clinician: Referring Corry Storie: Treating Symphony Demuro/Extender: Beverly Gust in Treatment: 1 Compression Therapy Performed for Wound Assessment: Wound #1 Right,Distal,Posterior Lower Leg Performed By: Clinician Carlene Coria, RN Compression Type: Three Layer Post Procedure Diagnosis Same as Pre-procedure Electronic Signature(s) Signed: 11/27/2019 5:17:39 PM By: Carlene Coria RN Entered By: Carlene Coria on 11/27/2019 16:17:33 -------------------------------------------------------------------------------- Compression Therapy Details Patient Name: Date of Service: [name] NA [place] N S. 11/27/2019 2:45 PM Medical Record Number: [id number] Patient Account Number: [id number] Date of Birth/Sex: Treating RN: Dec 27, 1970 (49 y.o. [name]) Carlene Coria Primary Care Derin Granquist: PA [name], NO Other Clinician: Referring Larue Lightner: Treating Dontrae Morini/Extender: Beverly Gust in Treatment: 1 Compression Therapy Performed for Wound Assessment: Wound #3 Right,Lateral Lower Leg Performed By: Clinician Carlene Coria, RN Compression Type: Three Layer Post Procedure Diagnosis Same as Pre-procedure Electronic Signature(s) Signed: 11/27/2019 5:17:39 PM By: Carlene Coria RN Entered By: Carlene Coria on 11/27/2019 16:17:33 -------------------------------------------------------------------------------- Compression Therapy Details Patient Name: Date of Service: [name] NA [place] N S. 11/27/2019 2:45 PM Medical Record Number: [id number] Patient Account Number: [id number] Date of Birth/Sex: Treating RN: Mar 13, 1971 (49 y.o. [name]) Carlene Coria Primary Care August Longest: PA [name], NO Other Clinician: Referring Kenneth Cuaresma: Treating Nyquan Selbe/Extender: Beverly Gust in Treatment: 1 Compression Therapy Performed for Wound Assessment: Wound #4 Right,Proximal,Lateral Lower Leg Performed By: Clinician Carlene Coria, RN Compression Type: Three Layer Post Procedure Diagnosis Same as Pre-procedure Electronic Signature(s) Signed: 11/27/2019 5:17:39 PM By: Carlene Coria RN Entered By: Carlene Coria on 11/27/2019 16:17:33 -------------------------------------------------------------------------------- Compression Therapy  Details Patient Name: Date of Service: [name] NA [place] N S. 11/27/2019 2:45 PM Medical Record Number: [id number] Patient Account Number: [id number] Date of Birth/Sex: Treating RN: 12-21-1970 (49 y.o. [name]) Carlene Coria Primary Care Jenavi Beedle: PA [name], NO Other Clinician: Referring Gennie Dib: Treating Campbell Agramonte/Extender: Beverly Gust in Treatment: 1 Compression Therapy Performed for Wound Assessment: Wound #5 Right,Distal,Lateral Lower Leg Performed By: Clinician Carlene Coria, RN Compression Type: Three Layer Post Procedure Diagnosis Same as Pre-procedure Electronic Signature(s) Signed: 11/27/2019 5:17:39 PM By: Carlene Coria RN Signed: 11/27/2019 5:17:39 PM By: Carlene Coria RN Entered By:  Carlene Coria on 11/27/2019 16:17:34 -------------------------------------------------------------------------------- Compression Therapy Details Patient Name: Date of Service: [name] NA [place] N S. 11/27/2019 2:45 PM Medical Record Number: [id number] Patient Account Number: [id number] Date of Birth/Sex: Treating RN: 05-31-1971 (49 y.o. [name]) Carlene Coria Primary Care Atzel Mccambridge: PA [name], NO Other Clinician: Referring Neriah Brott: Treating Randal Goens/Extender: Beverly Gust in Treatment: 1 Compression Therapy Performed for Wound Assessment: Wound #6 Right,Anterior Lower Leg Performed By: Clinician Carlene Coria, RN Compression Type: Three Layer Post Procedure Diagnosis Same as Pre-procedure Electronic Signature(s) Signed: 11/27/2019 5:17:39 PM By: Carlene Coria RN Entered By: Carlene Coria on 11/27/2019 16:17:34 -------------------------------------------------------------------------------- Encounter Discharge Information Details Patient Name: Date of Service: [name] 11/27/2019 2:45 PM Medical Record Number: [id number] Patient Account Number: [id number] Date of Birth/Sex: Treating RN: 11-14-1970 (49 y.o. [name] Primary Care Chloris Marcoux: PA [name],  NO Other Clinician: Referring Guadalupe Kerekes: Treating Manav Pierotti/Extender: Beverly Gust in Treatment: 1 Encounter Discharge Information Items Discharge Condition: Stable Ambulatory Status: Ambulatory Discharge Destination: Home Transportation: Private Auto Accompanied By: alone Schedule Follow-up Appointment: Yes Clinical Summary of Care: Patient Declined Electronic Signature(s) Signed: 11/29/2019 5:36:53 PM By: Levan Hurst RN, [name] Entered By: Levan Hurst on 11/27/2019 17:10:05 -------------------------------------------------------------------------------- Lower Extremity Assessment Details Patient Name: Date of Service: [name] 11/27/2019 2:45 PM Medical Record Number: [id number] Patient Account Number: [id number] Date of Birth/Sex: Treating RN: [birth year] (49 y.o. [name] Primary Care Demetris Meinhardt: PA TIENT, NO Other Clinician: Referring Kadee Philyaw: Treating Wilton Thrall/Extender: Beverly Gust in Treatment: 1 Edema Assessment Assessed: [Left: No] [Right: No] Edema: [Left: Ye] [Right: s] Calf Left: Right: Point of Measurement: 30 cm From Medial Instep cm 37.5 cm Ankle Left: Right: Point of Measurement: 10 cm From Medial Instep cm 22.8 cm Vascular Assessment Pulses: Dorsalis Pedis Palpable: [Right:Yes] Electronic Signature(s) Signed: 11/27/2019 5:12:39 PM By: Kela Millin Entered By: Kela Millin on 11/27/2019 15:39:56 -------------------------------------------------------------------------------- Multi-Disciplinary Care Plan Details Patient Name: Date of Service: [name] 11/27/2019 2:45 PM Medical Record Number: [id number] Patient Account Number: [id number] Date of Birth/Sex: Treating RN: [birth year] (49 y.o. [name]) Carlene Coria Primary Care Hendryx Ricke: PA [name], NO Other Clinician: Referring Nour Scalise: Treating Mariena Meares/Extender: Beverly Gust in Treatment: 1 Active Inactive Wound/Skin  Impairment Nursing Diagnoses: Knowledge deficit related to ulceration/compromised skin integrity Goals: Patient/caregiver will verbalize understanding of skin care regimen Date Initiated: 11/20/2019 Target Resolution Date: 12/21/2019 Goal Status: Active Ulcer/skin breakdown will have a volume reduction of 30% by week 4 Date Initiated: 11/20/2019 Target Resolution Date: 12/21/2019 Goal Status: Active Interventions: Assess patient/caregiver ability to obtain necessary supplies Assess patient/caregiver ability to perform ulcer/skin care regimen upon admission and as needed Assess ulceration(s) every visit Notes: Electronic Signature(s) Signed: 11/27/2019 5:17:39 PM By: Carlene Coria RN Entered By: Carlene Coria on 11/27/2019 15:05:47 -------------------------------------------------------------------------------- Pain Assessment Details Patient Name: Date of Service: [name] NA [place] N S. 11/27/2019 2:45 PM Medical Record Number: [id number] Patient Account Number: [id number] Date of Birth/Sex: Treating RN: June 26, 1971 (49 y.o. [name]) Carlene Coria Primary Care Arthuro Canelo: PA [name], NO Other Clinician: Referring Elease Swarm: Treating Amanda Pote/Extender: Beverly Gust in Treatment: 1 Active Problems Location of Pain Severity and Description of Pain Patient Has Paino Yes Site Locations Rate the pain. Current Pain Level: 5 Pain Management and Medication Current Pain Management: Electronic Signature(s) Signed: 11/27/2019 5:17:39 PM By: Carlene Coria RN Signed: 12/06/2019 9:13:31 AM By: Sandre Kitty Entered By: Sandre Kitty on 11/27/2019 15:15:56 --------------------------------------------------------------------------------  Patient/Caregiver Education Details Patient Name: Date of Service: [name] [place] THA Robb Matar 5/25/2021andnbsp2:45 PM Medical Record Number: [id number] Patient Account Number: [id number] Date of Birth/Gender: Treating RN: 13-Dec-1970 (49 y.o. [name]) Carlene Coria Primary Care Physician: PA [name], NO Other Clinician: Referring Physician: Treating Physician/Extender: Beverly Gust in Treatment: 1 Education Assessment Education Provided To: Patient Education Topics Provided Wound/Skin Impairment: Methods: Explain/Verbal Responses: State content correctly Electronic Signature(s) Signed: 11/27/2019 5:17:39 PM By: Carlene Coria RN Entered By: Carlene Coria on 11/27/2019 15:06:05 -------------------------------------------------------------------------------- Wound Assessment Details Patient Name: Date of Service: [name] NA [place] N S. 11/27/2019 2:45 PM Medical Record Number: [id number] Patient Account Number: [id number] Date of Birth/Sex: Treating RN: 1970-10-25 (49 y.o. [name]) Carlene Coria Primary Care Tahir Blank: PA [name], NO Other Clinician: Referring Starlin Steib: Treating Sena Hoopingarner/Extender: Beverly Gust in Treatment: 1 Wound Status Wound Number: 1 Primary Etiology: Diabetic Wound/Ulcer of the Lower Extremity Wound Location: Right, Distal, Posterior Lower Leg Secondary Etiology: Infection - not elsewhere classified Wounding Event: Gradually Appeared Wound Status: Open Date Acquired: 10/24/2019 Comorbid History: Type II Diabetes, Neuropathy Weeks Of Treatment: 1 Clustered Wound: No Photos Photo Uploaded By: Mikeal Hawthorne on 11/29/2019 14:25:37 Wound Measurements Length: (cm) 5 Width: (cm) 2.4 Depth: (cm) 0.1 Area: (cm) 9.425 Volume: (cm) 0.942 % Reduction in Area: 42.9% % Reduction in Volume: 42.9% Epithelialization: Small (1-33%) Tunneling: No Undermining: No Wound Description Classification: Grade 1 Wound Margin: Flat and Intact Exudate Amount: Medium Exudate Type: Serosanguineous Exudate Color: red, brown Foul Odor After Cleansing: No Slough/Fibrino Yes Wound Bed Granulation Amount: Small (1-33%) Exposed Structure Granulation Quality: Red Fascia Exposed: No Necrotic Amount: Large (67-100%) Fat  Layer (Subcutaneous Tissue) Exposed: Yes Necrotic Quality: Adherent Slough Tendon Exposed: No Muscle Exposed: No Joint Exposed: No Bone Exposed: No Treatment Notes Wound #1 (Right, Distal, Posterior Lower Leg) 1. Cleanse With Soap and water 2. Periwound Care Moisturizing lotion 3. Primary Dressing Applied Calcium Alginate Ag 4. Secondary Dressing ABD Pad Dry Gauze 6. Support Layer Applied 3 layer compression wrap Electronic Signature(s) Signed: 11/27/2019 5:12:39 PM By: Kela Millin Signed: 11/27/2019 5:17:39 PM By: Carlene Coria RN Entered By: Kela Millin on 11/27/2019 15:40:35 -------------------------------------------------------------------------------- Wound Assessment Details Patient Name: Date of Service: [name] NA [place] N S. 11/27/2019 2:45 PM Medical Record Number: [id number] Patient Account Number: [id number] Date of Birth/Sex: Treating RN: Jan 23, 1971 (49 y.o. [name]) Carlene Coria Primary Care Nyssa Sayegh: PA [name], NO Other Clinician: Referring Levon Boettcher: Treating Que Meneely/Extender: Beverly Gust in Treatment: 1 Wound Status Wound Number: 2 Primary Etiology: Diabetic Wound/Ulcer of the Lower Extremity Wound Location: Right, Proximal, Posterior Lower Leg Secondary Etiology: Infection - not elsewhere classified Wounding Event: Gradually Appeared Wound Status: Healed - Epithelialized Date Acquired: 10/24/2019 Comorbid History: Type II Diabetes, Neuropathy Weeks Of Treatment: 1 Clustered Wound: No Photos Photo Uploaded By: Mikeal Hawthorne on 11/29/2019 14:25:37 Wound Measurements Length: (cm) Width: (cm) Depth: (cm) Area: (cm) Volume: (cm) 0 % Reduction in Area: 100% 0 % Reduction in Volume: 100% 0 Epithelialization: None 0 Tunneling: No 0 Undermining: No Wound Description Classification: Grade 1 Wound Margin: Distinct, outline attached Exudate Amount: None Present Foul Odor After Cleansing: No Slough/Fibrino No Wound  Bed Granulation Amount: None Present (0%) Exposed Structure Necrotic Amount: None Present (0%) Fascia Exposed: No Fat Layer (Subcutaneous Tissue) Exposed: No Tendon Exposed: No Muscle Exposed: No Joint Exposed: No Bone Exposed: No Electronic Signature(s) Signed: 11/27/2019 5:12:39 PM By: Kela Millin Signed: 11/27/2019 5:17:39 PM By: Carlene Coria RN Entered  By: Kela Millin on 11/27/2019 15:40:21 -------------------------------------------------------------------------------- Wound Assessment Details Patient Name: Date of Service: [name] NA [place] N S. 11/27/2019 2:45 PM Medical Record Number: [id number] Patient Account Number: [id number] Date of Birth/Sex: Treating RN: 01/03/1971 (49 y.o. [name]) Carlene Coria Primary Care Laneta Guerin: PA [name], NO Other Clinician: Referring Anastasha Ortez: Treating Hershall Benkert/Extender: Beverly Gust in Treatment: 1 Wound Status Wound Number: 3 Primary Etiology: Diabetic Wound/Ulcer of the Lower Extremity Wound Location: Right, Lateral Lower Leg Secondary Etiology: Infection - not elsewhere classified Wounding Event: Gradually Appeared Wound Status: Open Date Acquired: 10/24/2019 Comorbid History: Type II Diabetes, Neuropathy Weeks Of Treatment: 1 Clustered Wound: No Photos Photo Uploaded By: Mikeal Hawthorne on 11/29/2019 14:24:42 Wound Measurements Length: (cm) 1.7 Width: (cm) 1.1 Depth: (cm) 0.1 Area: (cm) 1.469 Volume: (cm) 0.147 % Reduction in Area: -3.9% % Reduction in Volume: -4.3% Epithelialization: None Tunneling: No Undermining: No Wound Description Classification: Grade 1 Wound Margin: Flat and Intact Exudate Amount: Medium Exudate Type: Serous Exudate Color: amber Foul Odor After Cleansing: No Slough/Fibrino Yes Wound Bed Granulation Amount: Small (1-33%) Exposed Structure Granulation Quality: Red Fascia Exposed: No Necrotic Amount: Large (67-100%) Fat Layer (Subcutaneous Tissue) Exposed: Yes Necrotic  Quality: Adherent Slough Tendon Exposed: No Muscle Exposed: No Joint Exposed: No Bone Exposed: No Treatment Notes Wound #3 (Right, Lateral Lower Leg) 1. Cleanse With Soap and water 2. Periwound Care Moisturizing lotion 3. Primary Dressing Applied Calcium Alginate Ag 4. Secondary Dressing ABD Pad Dry Gauze 6. Support Layer Applied 3 layer compression wrap Electronic Signature(s) Signed: 11/27/2019 5:12:39 PM By: Kela Millin Signed: 11/27/2019 5:17:39 PM By: Carlene Coria RN Entered By: Kela Millin on 11/27/2019 15:41:00 -------------------------------------------------------------------------------- Wound Assessment Details Patient Name: Date of Service: [name] NA [place] N S. 11/27/2019 2:45 PM Medical Record Number: [id number] Patient Account Number: [id number] Date of Birth/Sex: Treating RN: 1970-12-10 (49 y.o. [name]) Carlene Coria Primary Care Gailen Venne: PA [name], NO Other Clinician: Referring Shakendra Griffeth: Treating Ajahnae Rathgeber/Extender: Beverly Gust in Treatment: 1 Wound Status Wound Number: 4 Primary Etiology: Diabetic Wound/Ulcer of the Lower Extremity Wound Location: Right, Proximal, Lateral Lower Leg Secondary Etiology: Infection - not elsewhere classified Wounding Event: Gradually Appeared Wound Status: Open Date Acquired: 10/24/2019 Comorbid History: Type II Diabetes, Neuropathy Weeks Of Treatment: 1 Clustered Wound: No Photos Photo Uploaded By: Mikeal Hawthorne on 11/29/2019 14:24:42 Wound Measurements Length: (cm) 2 Width: (cm) 1.3 Depth: (cm) 0.1 Area: (cm) 2.042 Volume: (cm) 0.204 % Reduction in Area: 13.3% % Reduction in Volume: 13.6% Epithelialization: Small (1-33%) Tunneling: No Undermining: No Wound Description Classification: Grade 1 Wound Margin: Flat and Intact Exudate Amount: Medium Exudate Type: Serous Exudate Color: amber Foul Odor After Cleansing: No Slough/Fibrino Yes Wound Bed Granulation Amount: Medium (34-66%)  Exposed Structure Necrotic Amount: Medium (34-66%) Fascia Exposed: No Necrotic Quality: Eschar, Adherent Slough Fat Layer (Subcutaneous Tissue) Exposed: Yes Tendon Exposed: No Muscle Exposed: No Joint Exposed: No Bone Exposed: No Treatment Notes Wound #4 (Right, Proximal, Lateral Lower Leg) 1. Cleanse With Soap and water 2. Periwound Care Moisturizing lotion 3. Primary Dressing Applied Calcium Alginate Ag 4. Secondary Dressing ABD Pad Dry Gauze 6. Support Layer Applied 3 layer compression wrap Electronic Signature(s) Signed: 11/27/2019 5:12:39 PM By: Kela Millin Signed: 11/27/2019 5:17:39 PM By: Carlene Coria RN Entered By: Kela Millin on 11/27/2019 15:42:40 -------------------------------------------------------------------------------- Wound Assessment Details Patient Name: Date of Service: [name] 11/27/2019 2:45 PM Medical Record Number: [id number] Patient Account Number: [id number] Date of Birth/Sex: Treating RN:  1971/06/12 (49 y.o. [name]) Carlene Coria Primary Care Mele Sylvester: PA [name], NO Other Clinician: Referring Merced Brougham: Treating Janiene Aarons/Extender: Beverly Gust in Treatment: 1 Wound Status Wound Number: 5 Primary Etiology: Diabetic Wound/Ulcer of the Lower Extremity Wound Location: Right, Distal, Lateral Lower Leg Secondary Etiology: Infection - not elsewhere classified Wounding Event: Gradually Appeared Wound Status: Open Date Acquired: 10/24/2019 Comorbid History: Type II Diabetes, Neuropathy Weeks Of Treatment: 1 Clustered Wound: No Photos Photo Uploaded By: Mikeal Hawthorne on 11/29/2019 14:25:04 Wound Measurements Length: (cm) 2 Width: (cm) 1.7 Depth: (cm) 0.1 Area: (cm) 2.67 Volume: (cm) 0.267 % Reduction in Area: 37% % Reduction in Volume: 37% Epithelialization: Small (1-33%) Tunneling: No Undermining: No Wound Description Classification: Grade 1 Wound Margin: Flat and Intact Exudate Amount: Medium Exudate  Type: Serous Exudate Color: amber Foul Odor After Cleansing: No Slough/Fibrino Yes Wound Bed Granulation Amount: Small (1-33%) Exposed Structure Granulation Quality: Red Fascia Exposed: No Necrotic Amount: Large (67-100%) Fat Layer (Subcutaneous Tissue) Exposed: Yes Necrotic Quality: Eschar, Adherent Slough Tendon Exposed: No Muscle Exposed: No Joint Exposed: No Bone Exposed: No Treatment Notes Wound #5 (Right, Distal, Lateral Lower Leg) 1. Cleanse With Soap and water 2. Periwound Care Moisturizing lotion 3. Primary Dressing Applied Calcium Alginate Ag 4. Secondary Dressing ABD Pad Dry Gauze 6. Support Layer Applied 3 layer compression wrap Electronic Signature(s) Signed: 11/27/2019 5:12:39 PM By: Kela Millin Signed: 11/27/2019 5:17:39 PM By: Carlene Coria RN Entered By: Kela Millin on 11/27/2019 15:42:57 -------------------------------------------------------------------------------- Wound Assessment Details Patient Name: Date of Service: [name] NA [place] N S. 11/27/2019 2:45 PM Medical Record Number: [id number] Patient Account Number: [id number] Date of Birth/Sex: Treating RN: January 21, 1971 (49 y.o. [name]) Carlene Coria Primary Care Everlie Eble: PA [name], NO Other Clinician: Referring Mikkel Charrette: Treating Nathifa Ritthaler/Extender: Beverly Gust in Treatment: 1 Wound Status Wound Number: 6 Primary Etiology: Diabetic Wound/Ulcer of the Lower Extremity Wound Location: Right, Anterior Lower Leg Secondary Etiology: Infection - not elsewhere classified Wounding Event: Gradually Appeared Wound Status: Open Date Acquired: 10/24/2019 Comorbid History: Type II Diabetes, Neuropathy Weeks Of Treatment: 1 Clustered Wound: No Photos Photo Uploaded By: Mikeal Hawthorne on 11/29/2019 14:24:12 Wound Measurements Length: (cm) 3.5 Width: (cm) 3 Depth: (cm) 0.1 Area: (cm) 8.247 Volume: (cm) 0.825 % Reduction in Area: 18% % Reduction in Volume: 17.9% Epithelialization:  None Tunneling: No Undermining: No Wound Description Classification: Grade 1 Wound Margin: Flat and Intact Exudate Amount: Medium Exudate Type: Serosanguineous Exudate Color: red, brown Foul Odor After Cleansing: No Slough/Fibrino Yes Wound Bed Granulation Amount: Medium (34-66%) Exposed Structure Granulation Quality: Pink Fascia Exposed: No Necrotic Amount: Medium (34-66%) Fat Layer (Subcutaneous Tissue) Exposed: Yes Necrotic Quality: Adherent Slough Tendon Exposed: No Muscle Exposed: No Joint Exposed: No Bone Exposed: No Treatment Notes Wound #6 (Right, Anterior Lower Leg) 1. Cleanse With Soap and water 2. Periwound Care Moisturizing lotion 3. Primary Dressing Applied Calcium Alginate Ag 4. Secondary Dressing ABD Pad Dry Gauze 6. Support Layer Applied 3 layer compression wrap Electronic Signature(s) Signed: 11/27/2019 5:12:39 PM By: Kela Millin Signed: 11/27/2019 5:17:39 PM By: Carlene Coria RN Entered By: Kela Millin on 11/27/2019 15:43:12 -------------------------------------------------------------------------------- Wound Assessment Details Patient Name: Date of Service: [name] 11/27/2019 2:45 PM Medical Record Number: [id number] Patient Account Number: [id number] Date of Birth/Sex: Treating RN: Feb 10, 1971 (49 y.o. [name]) Carlene Coria Primary Care Muskaan Smet: PA [name], NO Other Clinician: Referring Jossalin Chervenak: Treating Liala Codispoti/Extender: Beverly Gust in Treatment: 1 Wound Status Wound Number: 7 Primary Etiology: Diabetic Wound/Ulcer of the  Lower Extremity Wound Location: Right T Second oe Wound Status: Open Wounding Event: Trauma Comorbid History: Type II Diabetes, Neuropathy Date Acquired: 10/10/2019 Weeks Of Treatment: 1 Clustered Wound: No Photos Photo Uploaded By: Mikeal Hawthorne on 11/29/2019 14:24:13 Wound Measurements Length: (cm) 0.2 Width: (cm) 0.2 Depth: (cm) 0.1 Area: (cm) 0.031 Volume: (cm) 0.003 %  Reduction in Area: 50.8% % Reduction in Volume: 88% Epithelialization: None Tunneling: No Undermining: No Wound Description Classification: Grade 1 Wound Margin: Well defined, not attached Exudate Amount: Small Exudate Type: Serous Exudate Color: amber Foul Odor After Cleansing: No Slough/Fibrino No Wound Bed Granulation Amount: Large (67-100%) Exposed Structure Granulation Quality: Pink Fascia Exposed: No Necrotic Amount: None Present (0%) Fat Layer (Subcutaneous Tissue) Exposed: Yes Tendon Exposed: No Muscle Exposed: No Joint Exposed: No Bone Exposed: No Treatment Notes Wound #7 (Right Toe Second) 1. Cleanse With Soap and water 2. Periwound Care Moisturizing lotion 3. Primary Dressing Applied Calcium Alginate Ag 4. Secondary Dressing ABD Pad Dry Gauze 6. Support Layer Applied 3 layer compression wrap Electronic Signature(s) Signed: 11/27/2019 5:12:39 PM By: Kela Millin Signed: 11/27/2019 5:17:39 PM By: Carlene Coria RN Entered By: Kela Millin on 11/27/2019 15:43:31 -------------------------------------------------------------------------------- Vitals Details Patient Name: Date of Service: [name] 11/27/2019 2:45 PM Medical Record Number: [id number] Patient Account Number: [id number] Date of Birth/Sex: Treating RN: 25-Jun-1971 (49 y.o. [name]) Carlene Coria Primary Care Esley Brooking: PA [name], NO Other Clinician: Referring Aymee Fomby: Treating Amybeth Sieg/Extender: Beverly Gust in Treatment: 1 Vital Signs Time Taken: 15:16 Temperature (F): 98.2 Height (in): 70 Pulse (bpm): 99 Weight (lbs): 185 Respiratory Rate (breaths/min): 18 Body Mass Index (BMI): 26.5 Blood Pressure (mmHg): 159/93 Reference Range: 80 - 120 mg / dl Electronic Signature(s) Signed: 12/06/2019 9:13:31 AM By: Sandre Kitty Entered By: Sandre Kitty on 11/27/2019 15:17:48

## 2019-12-11 ENCOUNTER — Encounter (HOSPITAL_BASED_OUTPATIENT_CLINIC_OR_DEPARTMENT_OTHER): Payer: Self-pay | Admitting: Internal Medicine

## 2019-12-12 NOTE — Progress Notes (Signed)
COLBI, STAUBS ([id number]) Visit Report for 12/11/2019 Arrival Information Details Patient Name: Date of Service: [name] 12/11/2019 2:30 PM Medical Record Number: [id number] Patient Account Number: [id number] Date of Birth/Sex: Treating RN: [birth year] (49 y.o. Todd Mccoy Primary Care Breindy Meadow: PA [name], [place] Other Clinician: Referring Janijah Symons: Treating Lidia Clavijo/Extender: [name] in Treatment: 3 Visit Information History Since Last Visit Added or deleted any medications: No Patient Arrived: Ambulatory Any new allergies or adverse reactions: No Arrival Time: 14:43 Had a fall or experienced change in No Accompanied By: self activities of daily living that may affect Transfer Assistance: None risk of falls: Patient Identification Verified: Yes Signs or symptoms of abuse/neglect since last visito No Secondary Verification Process Completed: Yes Hospitalized since last visit: No Patient Requires Transmission-Based Precautions: No Implantable device outside of the clinic excluding No Patient Has Alerts: No cellular tissue based products placed in the center since last visit: Has Dressing in Place as Prescribed: Yes Has Compression in Place as Prescribed: Yes Pain Present Now: Yes Electronic Signature(s) Signed: 12/11/2019 5:24:07 PM By: Baruch Gouty RN, [name] Entered By: Baruch Gouty on 12/11/2019 14:47:20 -------------------------------------------------------------------------------- Clinic Level of Care Assessment Details Patient Name: Date of Service: [name] NA [place] N S. 12/11/2019 2:30 PM Medical Record Number: [id number] Patient Account Number: [id number] Date of Birth/Sex: Treating RN: 1971/04/04 (49 y.o. Todd Mccoy) Carlene Coria Primary Care Narda Fundora: PA [name], [place] Other Clinician: Referring Jashay Roddy: Treating Evora Schechter/Extender: [name] in Treatment: 3 Clinic Level of Care Assessment Items TOOL 4 Quantity Score X- 1  0 Use when only an EandM is performed on FOLLOW-UP visit ASSESSMENTS - Nursing Assessment / Reassessment X- 1 10 Reassessment of Co-morbidities (includes updates in patient status) X- 1 5 Reassessment of Adherence to Treatment Plan ASSESSMENTS - Wound and Skin A ssessment / Reassessment []  - 0 Simple Wound Assessment / Reassessment - one wound X- 5 5 Complex Wound Assessment / Reassessment - multiple wounds []  - 0 Dermatologic / Skin Assessment (not related to wound area) ASSESSMENTS - Focused Assessment []  - 0 Circumferential Edema Measurements - multi extremities []  - 0 Nutritional Assessment / Counseling / Intervention []  - 0 Lower Extremity Assessment (monofilament, tuning fork, pulses) []  - 0 Peripheral Arterial Disease Assessment (using hand held doppler) ASSESSMENTS - Ostomy and/or Continence Assessment and Care []  - 0 Incontinence Assessment and Management []  - 0 Ostomy Care Assessment and Management (repouching, etc.) PROCESS - Coordination of Care X - Simple Patient / Family Education for ongoing care 1 15 []  - 0 Complex (extensive) Patient / Family Education for ongoing care X- 1 10 Staff obtains Programmer, systems, Records, T Results / Process Orders est []  - 0 Staff telephones HHA, Nursing Homes / Clarify orders / etc []  - 0 Routine Transfer to another Facility (non-emergent condition) []  - 0 Routine Hospital Admission (non-emergent condition) []  - 0 New Admissions / Biomedical engineer / Ordering NPWT Apligraf, etc. , []  - 0 Emergency Hospital Admission (emergent condition) X- 1 10 Simple Discharge Coordination []  - 0 Complex (extensive) Discharge Coordination PROCESS - Special Needs []  - 0 Pediatric / Minor Patient Management []  - 0 Isolation Patient Management []  - 0 Hearing / Language / Visual special needs []  - 0 Assessment of Community assistance (transportation, D/C planning, etc.) []  - 0 Additional assistance / Altered mentation []  -  0 Support Surface(s) Assessment (bed, cushion, seat, etc.) INTERVENTIONS - Wound Cleansing / Measurement []  - 0 Simple Wound Cleansing -  one wound X- 5 5 Complex Wound Cleansing - multiple wounds X- 1 5 Wound Imaging (photographs - any number of wounds) []  - 0 Wound Tracing (instead of photographs) []  - 0 Simple Wound Measurement - one wound X- 5 5 Complex Wound Measurement - multiple wounds INTERVENTIONS - Wound Dressings []  - 0 Small Wound Dressing one or multiple wounds []  - 0 Medium Wound Dressing one or multiple wounds X- 1 20 Large Wound Dressing one or multiple wounds X- 1 5 Application of Medications - topical []  - 0 Application of Medications - injection INTERVENTIONS - Miscellaneous []  - 0 External ear exam []  - 0 Specimen Collection (cultures, biopsies, blood, body fluids, etc.) []  - 0 Specimen(s) / Culture(s) sent or taken to Lab for analysis []  - 0 Patient Transfer (multiple staff / Civil Service fast streamer / Similar devices) []  - 0 Simple Staple / Suture removal (25 or less) []  - 0 Complex Staple / Suture removal (26 or more) []  - 0 Hypo / Hyperglycemic Management (close monitor of Blood Glucose) []  - 0 Ankle / Brachial Index (ABI) - do not check if billed separately X- 1 5 Vital Signs Has the patient been seen at the hospital within the last three years: Yes Total Score: 160 Level Of Care: New/Established - Level 5 Electronic Signature(s) Signed: 12/11/2019 4:34:33 PM By: Carlene Coria RN Entered By: Carlene Coria on 12/11/2019 15:49:49 -------------------------------------------------------------------------------- Compression Therapy Details Patient Name: Date of Service: [name] NA [place] N S. 12/11/2019 2:30 PM Medical Record Number: [id number] Patient Account Number: [id number] Date of Birth/Sex: Treating RN: 01/07/1971 (49 y.o. Oval Linsey Primary Care Anallely Rosell: PA [name], [place] Other Clinician: Referring Yvette Roark: Treating Sumit Branham/Extender: [name] in Treatment: 3 Compression Therapy Performed for Wound Assessment: Wound #1 Right,Distal,Posterior Lower Leg Performed By: Clinician Carlene Coria, RN Compression Type: Three Layer Post Procedure Diagnosis Same as Pre-procedure Electronic Signature(s) Signed: 12/11/2019 4:34:33 PM By: Carlene Coria RN Entered By: Carlene Coria on 12/11/2019 16:04:41 -------------------------------------------------------------------------------- Compression Therapy Details Patient Name: Date of Service: [name] 12/11/2019 2:30 PM Medical Record Number: [id number] Patient Account Number: [id number] Date of Birth/Sex: Treating RN: 20-May-1971 (49 y.o. Todd Mccoy) Carlene Coria Primary Care Jasmeet Manton: PA [name], [place] Other Clinician: Referring Jaiel Saraceno: Treating Jenisis Harmsen/Extender: [name] in Treatment: 3 Compression Therapy Performed for Wound Assessment: Wound #3 Right,Lateral Lower Leg Performed By: Clinician Carlene Coria, RN Compression Type: Three Layer Post Procedure Diagnosis Same as Pre-procedure Electronic Signature(s) Signed: 12/11/2019 4:34:33 PM By: Carlene Coria RN Entered By: Carlene Coria on 12/11/2019 16:04:41 -------------------------------------------------------------------------------- Compression Therapy Details Patient Name: Date of Service: [name] 12/11/2019 2:30 PM Medical Record Number: [id number] Patient Account Number: [id number] Date of Birth/Sex: Treating RN: [birth year] (49 y.o. Todd Mccoy) Carlene Coria Primary Care Raheem Kolbe: PA [name], [place] Other Clinician: Referring Khiara Shuping: Treating Jamaya Sleeth/Extender: [name] in Treatment: 3 Compression Therapy Performed for Wound Assessment: Wound #5 Right,Distal,Lateral Lower Leg Performed By: Clinician Carlene Coria, RN Compression Type: Three Layer Post Procedure Diagnosis Same as Pre-procedure Electronic Signature(s) Signed: 12/11/2019 4:34:33 PM By: Carlene Coria RN Entered By:  Carlene Coria on 12/11/2019 16:04:41 -------------------------------------------------------------------------------- Compression Therapy Details Patient Name: Date of Service: [name] 12/11/2019 2:30 PM Medical Record Number: [id number] Patient Account Number: [id number] Date of Birth/Sex: Treating RN: December 26, 1970 (49 y.o. Oval Linsey Primary Care Jaxden Blyden: PA [name], [place] Other Clinician: Referring Treyce Spillers: Treating Luke Falero/Extender: [name] in Treatment:  3 Compression Therapy Performed for Wound Assessment: Wound #6 Right,Anterior Lower Leg Performed By: Clinician Carlene Coria, RN Compression Type: Three Layer Post Procedure Diagnosis Same as Pre-procedure Electronic Signature(s) Signed: 12/11/2019 4:34:33 PM By: Carlene Coria RN Entered By: Carlene Coria on 12/11/2019 16:04:42 -------------------------------------------------------------------------------- Encounter Discharge Information Details Patient Name: Date of Service: [name] 12/11/2019 2:30 PM Medical Record Number: [id number] Patient Account Number: [id number] Date of Birth/Sex: Treating RN: 12/31/1970 (49 y.o. Todd Mccoy Primary Care Sherwood Castilla: PA [name], [place] Other Clinician: Referring Oasis Goehring: Treating Ziggy Reveles/Extender: [name] in Treatment: 3 Encounter Discharge Information Items Discharge Condition: Stable Ambulatory Status: Ambulatory Discharge Destination: Home Transportation: Private Auto Accompanied By: self Schedule Follow-up Appointment: Yes Clinical Summary of Care: Patient Declined Electronic Signature(s) Signed: 12/11/2019 4:53:58 PM By: Kela Millin Entered By: Kela Millin on 12/11/2019 16:06:25 -------------------------------------------------------------------------------- Lower Extremity Assessment Details Patient Name: Date of Service: [name] 12/11/2019 2:30 PM Medical Record Number:  [id number] Patient Account Number: [id number] Date of Birth/Sex: Treating RN: 1971-01-15 (49 y.o. Todd Mccoy Primary Care Debby Clyne: PA [name], [place] Other Clinician: Referring Padme Arriaga: Treating Jessenya Berdan/Extender: [name] in Treatment: 3 Edema Assessment Assessed: [Left: No] [Right: No] Edema: [Left: Ye] [Right: s] Calf Left: Right: Point of Measurement: 30 cm From Medial Instep cm 33.7 cm Ankle Left: Right: Point of Measurement: 10 cm From Medial Instep cm 21.1 cm Vascular Assessment Pulses: Dorsalis Pedis Palpable: [Right:Yes] Electronic Signature(s) Signed: 12/11/2019 5:24:07 PM By: Baruch Gouty RN, [name] Entered By: Baruch Gouty on 12/11/2019 14:59:18 -------------------------------------------------------------------------------- Multi Wound Chart Details Patient Name: Date of Service: [name] 12/11/2019 2:30 PM Medical Record Number: [id number] Patient Account Number: [id number] Date of Birth/Sex: Treating RN: 28-Apr-1971 (49 y.o. Todd Mccoy) Carlene Coria Primary Care Semaj Kham: PA [name], [place] Other Clinician: Referring Sophya Vanblarcom: Treating Jameson Tormey/Extender: [name] in Treatment: 3 Vital Signs Height(in): 70 Capillary Blood Glucose(mg/dl): 175 Weight(lbs): 185 Pulse(bpm): 12 Body Mass Index(BMI): 21 Blood Pressure(mmHg): 142/90 Temperature(F): 98.8 Respiratory Rate(breaths/min): 18 Photos: [1:No Photos Right, Distal, Posterior Lower Leg] [3:No Photos Right, Lateral Lower Leg] [4:No Photos Right, Proximal, Lateral Lower Leg] Wound Location: [1:Gradually Appeared] [3:Gradually Appeared] [4:Gradually Appeared] Wounding Event: [1:Diabetic Wound/Ulcer of the Lower] [3:Diabetic Wound/Ulcer of the Lower] [4:Diabetic Wound/Ulcer of the Lower] Primary Etiology: [1:Extremity Infection - not elsewhere classified] [3:Extremity Infection - not elsewhere classified] [4:Extremity Infection - not elsewhere classified] Secondary Etiology:  [1:Type II Diabetes, Neuropathy] [3:Type II Diabetes, Neuropathy] [4:Type II Diabetes, Neuropathy] Comorbid History: [1:10/24/2019] [3:10/24/2019] [4:10/24/2019] Date Acquired: [1:3] [3:3] [4:3] Weeks of Treatment: [1:Open] [3:Open] [4:Healed - Epithelialized] Wound Status: [1:1.3x0.6x0.1] [3:1.6x0.8x0.1] [4:0x0x0] Measurements L x W x D (cm) [1:0.613] [3:1.005] [4:0] A (cm) : rea [1:0.061] [3:0.101] [4:0] Volume (cm) : [1:96.30%] [3:28.90%] [4:100.00%] % Reduction in A rea: [1:96.30%] [3:28.40%] [4:100.00%] % Reduction in Volume: [1:Grade 1] [3:Grade 1] [4:Grade 1] Classification: [1:Small] [3:Small] [4:None Present] Exudate A mount: [1:Serosanguineous] [3:Serosanguineous] [4:N/A] Exudate Type: [1:red, brown] [3:red, brown] [4:N/A] Exudate Color: [1:Flat and Intact] [3:Flat and Intact] [4:Indistinct, nonvisible] Wound Margin: [1:Large (67-100%)] [3:Large (67-100%)] [4:None Present (0%)] Granulation A mount: [1:Red] [3:Red, Pink] [4:N/A] Granulation Quality: [1:None Present (0%)] [3:None Present (0%)] [4:None Present (0%)] Necrotic A mount: [1:Fascia: No] [3:Fat Layer (Subcutaneous Tissue)] [4:Fascia: No] Exposed Structures: [1:Fat Layer (Subcutaneous Tissue) Exposed: No Tendon: No Muscle: No Joint: No Bone: No Limited to Skin Breakdown Large (67-100%)] [3:Exposed: Yes Fascia: No Tendon: No Muscle: No Joint: No Bone: No Small (  1-33%)] [4:Fat Layer (Subcutaneous Tissue)  Exposed: No Tendon: No Muscle: No Joint: No Bone: No Large (67-100%)] Wound Number: [id number]  Photos: No Photos No Photos No Photos Right, Distal, Lateral Lower Leg Right, Anterior Lower Leg Right T Second oe Wound Location: Gradually Appeared Gradually Appeared Trauma Wounding Event: Diabetic Wound/Ulcer of the Lower Diabetic Wound/Ulcer of the Lower Diabetic Wound/Ulcer of the Lower Primary Etiology: Extremity Extremity Extremity Infection - not elsewhere classified Infection - not elsewhere classified N/A Secondary  Etiology: Type II Diabetes, Neuropathy Type II Diabetes, Neuropathy Type II Diabetes, Neuropathy Comorbid History: 10/24/2019 10/24/2019 10/10/2019 Date Acquired: 3 3 3  Weeks of Treatment: Open Open Open Wound Status: 1.8x1.3x0.1 0.3x0.5x0.1 0.1x0.1x0.1 Measurements L x W x D (cm) 1.838 0.118 0.008 A (cm) : rea 0.184 0.012 0.001 Volume (cm) : 56.70% 98.80% 87.30% % Reduction in A rea: 56.60% 98.80% 96.00% % Reduction in Volume: Grade 1 Grade 1 Grade 1 Classification: Small Small None Present Exudate A mount: Serosanguineous Serous N/A Exudate Type: red, brown amber N/A Exudate Color: Flat and Intact Flat and Intact Flat and Intact Wound Margin: Large (67-100%) Small (1-33%) None Present (0%) Granulation A mount: Red Red N/A Granulation Quality: None Present (0%) None Present (0%) None Present (0%) Necrotic A mount: Fat Layer (Subcutaneous Tissue) Fascia: No Fascia: No Exposed Structures: Exposed: Yes Fat Layer (Subcutaneous Tissue) Fat Layer (Subcutaneous Tissue) Fascia: No Exposed: No Exposed: No Tendon: No Tendon: No Tendon: No Muscle: No Muscle: No Muscle: No Joint: No Joint: No Joint: No Bone: No Bone: No Bone: No Limited to Skin Breakdown Small (1-33%) Large (67-100%) Large (67-100%) Epithelialization: Treatment Notes Electronic Signature(s) Signed: 12/11/2019 4:34:33 PM By: Carlene Coria RN Signed: 12/12/2019 4:17:30 PM By: Linton Ham MD Entered By: Linton Ham on 12/11/2019 16:04:08 -------------------------------------------------------------------------------- Multi-Disciplinary Care Plan Details Patient Name: Date of Service: [name] 12/11/2019 2:30 PM Medical Record Number: [id number] Patient Account Number: [id number] Date of Birth/Sex: Treating RN: December 25, 1970 (49 y.o. Todd Mccoy) Carlene Coria Primary Care Aerielle Stoklosa: PA [name], [place] Other Clinician: Referring Christabelle Hanzlik: Treating Merci Walthers/Extender: [name] in  Treatment: 3 Active Inactive Wound/Skin Impairment Nursing Diagnoses: Knowledge deficit related to ulceration/compromised skin integrity Goals: Patient/caregiver will verbalize understanding of skin care regimen Date Initiated: 11/20/2019 Target Resolution Date: 12/21/2019 Goal Status: Active Ulcer/skin breakdown will have a volume reduction of 30% by week 4 Date Initiated: 11/20/2019 Target Resolution Date: 12/21/2019 Goal Status: Active Interventions: Assess patient/caregiver ability to obtain necessary supplies Assess patient/caregiver ability to perform ulcer/skin care regimen upon admission and as needed Assess ulceration(s) every visit Notes: Electronic Signature(s) Signed: 12/11/2019 4:34:33 PM By: Carlene Coria RN Entered By: Carlene Coria on 12/11/2019 15:40:02 -------------------------------------------------------------------------------- Pain Assessment Details Patient Name: Date of Service: [name] NA [place] N S. 12/11/2019 2:30 PM Medical Record Number: [id number] Patient Account Number: [id number] Date of Birth/Sex: Treating RN: 1970-07-10 (49 y.o. Todd Mccoy Primary Care Davisha Linthicum: PA [name], [place] Other Clinician: Referring Doretha Goding: Treating Rayyan Orsborn/Extender: [name] in Treatment: 3 Active Problems Location of Pain Severity and Description of Pain Patient Has Paino Yes Site Locations Pain Location: Pain Location: Pain in Ulcers With Dressing Change: Yes Duration of the Pain. Constant / Intermittento Intermittent Rate the pain. Current Pain Level: 4 Worst Pain Level: 8 Least Pain Level: 0 Character of Pain Describe the Pain: Throbbing Pain Management and Medication Current Pain Management: Medication: Yes Is the Current Pain Management Adequate: Adequate Rest: Yes How does your wound impact your activities  of daily livingo Sleep: Yes Bathing: No Appetite: No Relationship With Others: No Bladder Continence: No Emotions:  No Bowel Continence: No Work: No Toileting: No Drive: No Dressing: No Hobbies: Yes Electronic Signature(s) Signed: 12/11/2019 5:24:07 PM By: Baruch Gouty RN, [name] Entered By: Baruch Gouty on 12/11/2019 14:58:08 -------------------------------------------------------------------------------- Patient/Caregiver Education Details Patient Name: Date of Service: [name] NA THA Robb Matar 6/8/2021andnbsp2:30 PM Medical Record Number: [id number] Patient Account Number: [id number] Date of Birth/Gender: Treating RN: Apr 27, 1971 (49 y.o. Todd Mccoy) Carlene Coria Primary Care Physician: PA [name], [place] Other Clinician: Referring Physician: Treating Physician/Extender: [name] in Treatment: 3 Education Assessment Education Provided To: Patient Education Topics Provided Wound/Skin Impairment: Methods: Explain/Verbal Responses: State content correctly Electronic Signature(s) Signed: 12/11/2019 4:34:33 PM By: Carlene Coria RN Entered By: Carlene Coria on 12/11/2019 15:40:41 -------------------------------------------------------------------------------- Wound Assessment Details Patient Name: Date of Service: [name] 12/11/2019 2:30 PM Medical Record Number: [id number] Patient Account Number: [id number] Date of Birth/Sex: Treating RN: 10-Oct-1970 (49 y.o. Todd Mccoy Primary Care Ramona Ruark: PA [name], [place] Other Clinician: Referring Jaquin Coy: Treating Anthonette Lesage/Extender: [name] in Treatment: 3 Wound Status Wound Number: 1 Primary Etiology: Diabetic Wound/Ulcer of the Lower Extremity Wound Location: Right, Distal, Posterior Lower Leg Secondary Etiology: Infection - not elsewhere classified Wounding Event: Gradually Appeared Wound Status: Open Date Acquired: 10/24/2019 Comorbid History: Type II Diabetes, Neuropathy Weeks Of Treatment: 3 Clustered Wound: No Photos Photo Uploaded By: Mikeal Hawthorne on 12/12/2019 14:19:02 Wound Measurements Length:  (cm) 1.3 % Width: (cm) 0.6 % Depth: (cm) 0.1 Ep Area: (cm) 0.613 T Volume: (cm) 0.061 U Reduction in Area: 96.3% Reduction in Volume: 96.3% ithelialization: Large (67-100%) unneling: No ndermining: No Wound Description Classification: Grade 1 Wound Margin: Flat and Intact Exudate Amount: Small Exudate Type: Serosanguineous Exudate Color: red, brown Wound Bed Granulation Amount: Large (67-100%) Exposed Structure Granulation Quality: Red Fascia Exposed: No Necrotic Amount: None Present (0%) Fat Layer (Subcutaneous Tissue) Exposed: No Tendon Exposed: No Muscle Exposed: No Joint Exposed: No Bone Exposed: No Limited to Skin Breakdown Treatment Notes Wound #1 (Right, Distal, Posterior Lower Leg) 1. Cleanse With Wound Cleanser Soap and water 2. Periwound Care Moisturizing lotion 3. Primary Dressing Applied Calcium Alginate Ag 4. Secondary Dressing ABD Pad 6. Support Layer Applied 3 layer compression wrap Notes netting Electronic Signature(s) Signed: 12/11/2019 5:24:07 PM By: Baruch Gouty RN, [name] Entered By: Baruch Gouty on 12/11/2019 15:07:33 -------------------------------------------------------------------------------- Wound Assessment Details Patient Name: Date of Service: [name] 12/11/2019 2:30 PM Medical Record Number: [id number] Patient Account Number: [id number] Date of Birth/Sex: Treating RN: May 06, 1971 (49 y.o. Todd Mccoy Primary Care Mari Battaglia: PA [name], [place] Other Clinician: Referring Infiniti Hoefling: Treating Chandi Nicklin/Extender: [name] in Treatment: 3 Wound Status Wound Number: 3 Primary Etiology: Diabetic Wound/Ulcer of the Lower Extremity Wound Location: Right, Lateral Lower Leg Secondary Etiology: Infection - not elsewhere classified Wounding Event: Gradually Appeared Wound Status: Open Date Acquired: 10/24/2019 Comorbid History: Type II Diabetes, Neuropathy Weeks Of Treatment: 3 Clustered Wound:  No Photos Photo Uploaded By: Mikeal Hawthorne on 12/12/2019 14:18:25 Wound Measurements Length: (cm) 1.6 Width: (cm) 0.8 Depth: (cm) 0.1 Area: (cm) 1.005 Volume: (cm) 0.101 % Reduction in Area: 28.9% % Reduction in Volume: 28.4% Epithelialization: Small (1-33%) Tunneling: No Undermining: No Wound Description Classification: Grade 1 Wound Margin: Flat and Intact Exudate Amount: Small Exudate Type: Serosanguineous Exudate Color: red, brown Foul Odor After Cleansing: No Slough/Fibrino No Wound Bed Granulation Amount: Large (67-100%)  Exposed Structure Granulation Quality: Red, Pink Fascia Exposed: No Necrotic Amount: None Present (0%) Fat Layer (Subcutaneous Tissue) Exposed: Yes Tendon Exposed: No Muscle Exposed: No Joint Exposed: No Bone Exposed: No Treatment Notes Wound #3 (Right, Lateral Lower Leg) 1. Cleanse With Wound Cleanser Soap and water 2. Periwound Care Moisturizing lotion 3. Primary Dressing Applied Calcium Alginate Ag 4. Secondary Dressing ABD Pad 6. Support Layer Applied 3 layer compression wrap Notes netting Electronic Signature(s) Signed: 12/11/2019 5:24:07 PM By: Baruch Gouty RN, [name] Entered By: Baruch Gouty on 12/11/2019 15:08:22 -------------------------------------------------------------------------------- Wound Assessment Details Patient Name: Date of Service: [name] 12/11/2019 2:30 PM Medical Record Number: [id number] Patient Account Number: [id number] Date of Birth/Sex: Treating RN: [birth year] (49 y.o. Todd Mccoy Primary Care Takerra Lupinacci: PA [name], [place] Other Clinician: Referring Carmilla Granville: Treating Gresia Isidoro/Extender: [name] in Treatment: 3 Wound Status Wound Number: 4 Primary Etiology: Diabetic Wound/Ulcer of the Lower Extremity Wound Location: Right, Proximal, Lateral Lower Leg Secondary Etiology: Infection - not elsewhere classified Wounding Event: Gradually Appeared Wound Status: Healed -  Epithelialized Date Acquired: 10/24/2019 Comorbid History: Type II Diabetes, Neuropathy Weeks Of Treatment: 3 Clustered Wound: No Photos Photo Uploaded By: Mikeal Hawthorne on 12/12/2019 14:17:52 Wound Measurements Length: (cm) Width: (cm) Depth: (cm) Area: (cm) Volume: (cm) 0 % Reduction in Area: 100% 0 % Reduction in Volume: 100% 0 Epithelialization: Large (67-100%) 0 Tunneling: No 0 Undermining: No Wound Description Classification: Grade 1 Wound Margin: Indistinct, nonvisible Exudate Amount: None Present Foul Odor After Cleansing: No Slough/Fibrino No Wound Bed Granulation Amount: None Present (0%) Exposed Structure Necrotic Amount: None Present (0%) Fascia Exposed: No Fat Layer (Subcutaneous Tissue) Exposed: No Tendon Exposed: No Muscle Exposed: No Joint Exposed: No Bone Exposed: No Electronic Signature(s) Signed: 12/11/2019 5:24:07 PM By: Baruch Gouty RN, [name] Entered By: Baruch Gouty on 12/11/2019 15:09:03 -------------------------------------------------------------------------------- Wound Assessment Details Patient Name: Date of Service: [name] 12/11/2019 2:30 PM Medical Record Number: [id number] Patient Account Number: [id number] Date of Birth/Sex: Treating RN: [birth year] (49 y.o. Todd Mccoy Primary Care Lasonya Hubner: PA [name], [place] Other Clinician: Referring Krystian Ferrentino: Treating Jacayla Nordell/Extender: [name] in Treatment: 3 Wound Status Wound Number: 5 Primary Etiology: Diabetic Wound/Ulcer of the Lower Extremity Wound Location: Right, Distal, Lateral Lower Leg Secondary Etiology: Infection - not elsewhere classified Wounding Event: Gradually Appeared Wound Status: Open Date Acquired: 10/24/2019 Comorbid History: Type II Diabetes, Neuropathy Weeks Of Treatment: 3 Clustered Wound: No Photos Photo Uploaded By: Mikeal Hawthorne on 12/12/2019 14:17:53 Wound Measurements Length: (cm) 1.8 Width: (cm) 1.3 Depth: (cm)  0.1 Area: (cm) 1.838 Volume: (cm) 0.184 % Reduction in Area: 56.7% % Reduction in Volume: 56.6% Epithelialization: Small (1-33%) Tunneling: No Undermining: No Wound Description Classification: Grade 1 Wound Margin: Flat and Intact Exudate Amount: Small Exudate Type: Serosanguineous Exudate Color: red, brown Foul Odor After Cleansing: No Slough/Fibrino No Wound Bed Granulation Amount: Large (67-100%) Exposed Structure Granulation Quality: Red Fascia Exposed: No Necrotic Amount: None Present (0%) Fat Layer (Subcutaneous Tissue) Exposed: Yes Tendon Exposed: No Muscle Exposed: No Joint Exposed: No Bone Exposed: No Treatment Notes Wound #5 (Right, Distal, Lateral Lower Leg) 1. Cleanse With Wound Cleanser Soap and water 2. Periwound Care Moisturizing lotion 3. Primary Dressing Applied Calcium Alginate Ag 4. Secondary Dressing ABD Pad 6. Support Layer Applied 3 layer compression wrap Notes netting Electronic Signature(s) Signed: 12/11/2019 5:24:07 PM By: Baruch Gouty RN, [name] Entered By: Baruch Gouty on 12/11/2019 15:09:44 -------------------------------------------------------------------------------- Wound Assessment Details  Patient Name: Date of Service: [name] 12/11/2019 2:30 PM Medical Record Number: [id number] Patient Account Number: [id number] Date of Birth/Sex: Treating RN: 22-Mar-1971 (49 y.o. Todd Mccoy Primary Care Joni Colegrove: PA [name], [place] Other Clinician: Referring Silviano Neuser: Treating Leilanie Rauda/Extender: [name] in Treatment: 3 Wound Status Wound Number: 6 Primary Etiology: Diabetic Wound/Ulcer of the Lower Extremity Wound Location: Right, Anterior Lower Leg Secondary Etiology: Infection - not elsewhere classified Wounding Event: Gradually Appeared Wound Status: Open Date Acquired: 10/24/2019 Comorbid History: Type II Diabetes, Neuropathy Weeks Of Treatment: 3 Clustered Wound: No Photos Photo Uploaded By:  Mikeal Hawthorne on 12/12/2019 14:16:46 Wound Measurements Length: (cm) 0.3 Width: (cm) 0.5 Depth: (cm) 0.1 Area: (cm) 0.118 Volume: (cm) 0.012 % Reduction in Area: 98.8% % Reduction in Volume: 98.8% Epithelialization: Large (67-100%) Tunneling: No Undermining: No Wound Description Classification: Grade 1 Wound Margin: Flat and Intact Exudate Amount: Small Exudate Type: Serous Exudate Color: amber Foul Odor After Cleansing: No Slough/Fibrino No Wound Bed Granulation Amount: Small (1-33%) Exposed Structure Granulation Quality: Red Fascia Exposed: No Necrotic Amount: None Present (0%) Fat Layer (Subcutaneous Tissue) Exposed: No Tendon Exposed: No Muscle Exposed: No Joint Exposed: No Bone Exposed: No Limited to Skin Breakdown Treatment Notes Wound #6 (Right, Anterior Lower Leg) 1. Cleanse With Wound Cleanser Soap and water 2. Periwound Care Moisturizing lotion 3. Primary Dressing Applied Calcium Alginate Ag 4. Secondary Dressing ABD Pad 6. Support Layer Applied 3 layer compression wrap Notes netting Electronic Signature(s) Signed: 12/11/2019 5:24:07 PM By: Baruch Gouty RN, [name] Entered By: Baruch Gouty on 12/11/2019 15:11:18 -------------------------------------------------------------------------------- Wound Assessment Details Patient Name: Date of Service: [name] 12/11/2019 2:30 PM Medical Record Number: [id number] Patient Account Number: [id number] Date of Birth/Sex: Treating RN: 22-Oct-1970 (50 y.o. Todd Mccoy Primary Care Tacey Dimaggio: PA [name], [place] Other Clinician: Referring Alvin Rubano: Treating Zohan Shiflet/Extender: [name] in Treatment: 3 Wound Status Wound Number: 7 Primary Etiology: Diabetic Wound/Ulcer of the Lower Extremity Wound Location: Right T Second oe Wound Status: Open Wounding Event: Trauma Comorbid History: Type II Diabetes, Neuropathy Date Acquired: 10/10/2019 Weeks Of Treatment: 3 Clustered Wound:  No Photos Photo Uploaded By: Mikeal Hawthorne on 12/12/2019 14:16:46 Wound Measurements Length: (cm) 0.1 Width: (cm) 0.1 Depth: (cm) 0.1 Area: (cm) 0.008 Volume: (cm) 0.001 % Reduction in Area: 87.3% % Reduction in Volume: 96% Epithelialization: Large (67-100%) Tunneling: No Undermining: No Wound Description Classification: Grade 1 Wound Margin: Flat and Intact Exudate Amount: None Present Foul Odor After Cleansing: No Slough/Fibrino No Wound Bed Granulation Amount: None Present (0%) Exposed Structure Necrotic Amount: None Present (0%) Fascia Exposed: No Fat Layer (Subcutaneous Tissue) Exposed: No Tendon Exposed: No Muscle Exposed: No Joint Exposed: No Bone Exposed: No Treatment Notes Wound #7 (Right Toe Second) 1. Cleanse With Wound Cleanser Soap and water 2. Periwound Care Moisturizing lotion 3. Primary Dressing Applied Calcium Alginate Ag 4. Secondary Dressing ABD Pad 6. Support Layer Applied 3 layer compression wrap Notes netting Electronic Signature(s) Signed: 12/11/2019 5:24:07 PM By: Baruch Gouty RN, [name] Entered By: Baruch Gouty on 12/11/2019 15:12:07 -------------------------------------------------------------------------------- [place] Details Patient Name: Date of Service: [name] 12/11/2019 2:30 PM Medical Record Number: [id number] Patient Account Number: [id number] Date of Birth/Sex: Treating RN: 10-04-1970 (49 y.o. Todd Mccoy Primary Care Damyia Strider: PA [name], [place] Other Clinician: Referring Abram Sax: Treating Marea Reasner/Extender: [name] in Treatment: 3 Vital Signs Time Taken: 14:48 Temperature (F): 98.8 Height (in): 70 Pulse (  bpm): 102 Source: Stated Respiratory Rate (breaths/min): 18 Weight (lbs): 185 Blood Pressure (mmHg): 142/90 Source: Stated Capillary Blood Glucose (mg/dl): 175 Body Mass Index (BMI): 26.5 Reference Range: 80 - 120 mg / dl Notes glucose per pt report this am Electronic  Signature(s) Signed: 12/11/2019 5:24:07 PM By: Baruch Gouty RN, [name] Entered By: Baruch Gouty on 12/11/2019 14:49:27

## 2019-12-12 NOTE — Progress Notes (Signed)
DELMAS, FAUCETT ([id number]) Visit Report for 12/11/2019 HPI Details Patient Name: Date of Service: MO [name] 12/11/2019 2:30 PM Medical Record Number: [id number] Patient Account Number: [id number] Date of Birth/Sex: Treating RN: [birth year] (49 y.o. [name]) Carlene Coria Primary Care Provider: PA [name], [place] Other Clinician: Referring Provider: Treating Provider/Extender: [name] in Treatment: 3 History of Present Illness HPI Description: ADMISSION 11/20/2019; this is a 49 year old man with poorly controlled type 2 diabetes. Roughly 3 to 4 weeks ago he noted swelling of his leg open wounds with draining sores. This happened fairly suddenly. He was seen in the ER on 11/07/2019 noted to have wounds of the right lower extremity x2 weeks at that point. An x-ray of the area was negative one of the wounds was cultured showing MSSA and group B strep. He was noted to have multiple wounds. He was given a course of doxycycline for 7 days white count was 8.4 hemoglobin 9.7. Since then he has been washing the leg and applying dry gauze. He is here in the clinic for review of this. 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 At that point his culture was MRSA. No no 11/27/19-Patient returns after starting in the clinic for open leg wounds on his right and his right second toe. We have been applying silver alginate and 3 layer compression on the right. Patient completed course of doxycycline. The wounds are looking slightly smaller, the right posterior wound appears to have healed 6/8; patient is here in follow-up for open wounds on his right leg and his right second toe. X-ray I did last time was strangely negative. I gave him a course of Keflex which she is completed the area on the tip of the second toe is actually close over. All of the wounds on  his right anterior lateral and posterior leg are considerably better. There is still open wounds on the right lateral and right posterior but they are measuring better. These were felt to be abscesses for which she was hospitalized in early May. Cultures originally showed MSSA and group B strep Electronic Signature(s) Signed: 12/12/2019 4:17:30 PM By: Linton Ham MD Entered By: Linton Ham on 12/11/2019 16:05:43 -------------------------------------------------------------------------------- Physical Exam Details Patient Name: Date of Service: MO [name] 12/11/2019 2:30 PM Medical Record Number: [id number] Patient Account Number: [id number] Date of Birth/Sex: Treating RN: 11-Feb-1971 (49 y.o. Oval Linsey Primary Care Provider: PA [name], [place] Other Clinician: Referring Provider: Treating Provider/Extender: [name] in Treatment: 3 Constitutional Patient is hypertensive.. Pulse regular and within target range for patient.Marland Kitchen Respirations regular, non-labored and within target range.. Temperature is normal and within the target range for the patient.Marland Kitchen Appears in no distress. Cardiovascular Pedal pulses are palpable on the right. We have good edema control in the right lower leg.. Musculoskeletal Discoloration of the right second toe persists. I felt this might be infected last time I saw him which is one of the reasons we did the x-ray and the antibiotics. There is no palpable tenderness it is not particularly cool. Notes Wound exam; multiple wounds on the right lower leg mostly in the anterior and lateral areas. All of these things look a lot better. In fact there is a lot of epithelialization. Only small areas of the original wounds are still open. The right  second toe wound on the tip of the toe is also closed. The toe was discolored I am not exactly sure why. Not obviously ischemic and not obviously infected after a course of antibiotics and  x-rays. Electronic Signature(s) Signed: 12/12/2019 4:17:30 PM By: Linton Ham MD Entered By: Linton Ham on 12/11/2019 16:11:39 -------------------------------------------------------------------------------- Physician Orders Details Patient Name: Date of Service: MO [name] 12/11/2019 2:30 PM Medical Record Number: [id number] Patient Account Number: [id number] Date of Birth/Sex: Treating RN: [birth year] (49 y.o. 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 Nurse Visit: - 1 week Dressing Change Frequency Wound #1 Right,Distal,Posterior Lower Leg Do not change entire dressing for one week. Wound #3 Right,Lateral Lower Leg Do not change entire dressing for one week. Wound #5 Right,Distal,Lateral Lower Leg Do not change entire dressing for one week. Wound #6 Right,Anterior Lower Leg Do not change entire dressing for one week. Wound #7 Right T Second oe Change Dressing every other day. Skin Barriers/Peri-Wound Care Moisturizing lotion Wound Cleansing Wound #1 Right,Distal,Posterior Lower Leg May shower with protection. Wound #3 Right,Lateral Lower Leg May shower with protection. Wound #5 Right,Distal,Lateral Lower Leg May shower with protection. Wound #6 Right,Anterior Lower Leg May shower with protection. Wound #7 Right T Second oe May shower with protection. Primary Wound  Dressing Wound #1 Right,Distal,Posterior Lower Leg Calcium Alginate with Silver Wound #3 Right,Lateral Lower Leg Calcium Alginate with Silver Wound #5 Right,Distal,Lateral Lower Leg Calcium Alginate with Silver Wound #6 Right,Anterior Lower Leg Calcium Alginate with Silver Wound #7 Right T Second oe Calcium Alginate with Silver Secondary Dressing Wound #1 Right,Distal,Posterior Lower Leg Dry Gauze ABD pad Wound #3 Right,Lateral Lower Leg Dry Gauze ABD pad Wound #5 Right,Distal,Lateral Lower Leg Dry Gauze ABD pad Wound #6 Right,Anterior Lower Leg Dry Gauze ABD pad Wound #7 Right T Second oe Kerlix/Rolled Gauze - secure with tape or netting Dry Gauze Edema Control Wound #1 Right,Distal,Posterior Lower Leg 3 Layer Compression System - Right Lower Extremity Wound #3 Right,Lateral Lower Leg 3 Layer Compression System - Right Lower Extremity Wound #5 Right,Distal,Lateral Lower Leg 3 Layer Compression System - Right Lower Extremity Wound #6 Right,Anterior Lower Leg 3 Layer Compression System - Right Lower Extremity Electronic Signature(s) Signed: 12/11/2019 4:34:33 PM By: Carlene Coria RN Signed: 12/12/2019 4:17:30 PM By: Linton Ham MD Entered By: Carlene Coria on 12/11/2019 15:30:19 -------------------------------------------------------------------------------- Problem List Details Patient Name: Date of Service: MO [name] 12/11/2019 2:30 PM Medical Record Number: [id number] Patient Account Number: [id number] Date of Birth/Sex: Treating RN: June 23, 1971 (49 y.o. Oval Linsey Primary Care Provider: PA [name], [place] Other Clinician: Referring Provider: Treating Provider/Extender: [name] in Treatment: 3 Active Problems ICD-10 Encounter Code Description Active Date MDM Diagnosis E11.621 Type 2 diabetes mellitus with foot ulcer 11/20/2019 No Yes E11.622 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 ulcer 11/20/2019 No Yes L97.818 Non-pressure  chronic ulcer of other part of right lower leg with other specified 11/20/2019 No Yes severity L97.514  Non-pressure chronic ulcer of other part of right foot with necrosis of bone 11/20/2019 No Yes E11.42 Type 2 diabetes mellitus with diabetic polyneuropathy 11/20/2019 No Yes Inactive Problems Resolved Problems Electronic Signature(s) Signed: 12/12/2019 4:17:30 PM By: Linton Ham MD Entered By: Linton Ham on 12/11/2019 16:03:57 -------------------------------------------------------------------------------- Progress Note Details Patient Name: Date of Service: MO [name] 12/11/2019 2:30 PM Medical Record Number: [id number] Patient Account Number: [id number] Date of Birth/Sex: Treating RN: 08-01-1970 (49 y.o. Oval Linsey Primary Care Provider: PA [name], [place] Other Clinician: Referring Provider: Treating Provider/Extender: [name] in Treatment: 3 Subjective History of Present Illness (HPI) ADMISSION 11/20/2019; this is a 49 year old man with poorly controlled type 2 diabetes. Roughly 3 to 4 weeks ago he noted swelling of his leg open wounds with draining sores. This happened fairly suddenly. He was seen in the ER on 11/07/2019 noted to have wounds of the right lower extremity x2 weeks at that point. An x-ray of the area was negative one of the wounds was cultured showing MSSA and group B strep. He was noted to have multiple wounds. He was given a course of doxycycline for 7 days white count was 8.4 hemoglobin 9.7. Since then he has been washing the leg and applying dry gauze. He is here in the clinic for review of this. 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 At that point his culture was MRSA. No no 11/27/19-Patient returns after starting in the clinic for open leg wounds on his right and his right second toe. We have  been applying silver alginate and 3 layer compression on the right. Patient completed course of doxycycline. The wounds are looking slightly smaller, the right posterior wound appears to have healed 6/8; patient is here in follow-up for open wounds on his right leg and his right second toe. X-ray I did last time was strangely negative. I gave him a course of Keflex which she is completed the area on the tip of the second toe is actually close over. All of the wounds on his right anterior lateral and posterior leg are considerably better. There is still open wounds on the right lateral and right posterior but they are measuring better. These were felt to be abscesses for which she was hospitalized in early May. Cultures originally showed MSSA and group B strep Objective Constitutional Patient is hypertensive.. Pulse regular and within target range for patient.Marland Kitchen Respirations regular, non-labored and within target range.. Temperature is normal and within the target range for the patient.Marland Kitchen Appears in no distress. Vitals Time Taken: 2:48 PM, Height: 70 in, Source: Stated, Weight: 185 lbs, Source: Stated, BMI: 26.5, Temperature: 98.8 F, Pulse: 102 bpm, Respiratory Rate: 18 breaths/min, Blood Pressure: 142/90 mmHg, Capillary Blood Glucose: 175 mg/dl. General Notes: glucose per pt report this am Cardiovascular Pedal pulses are palpable on the right. We have good edema control in the right lower leg.. Musculoskeletal Discoloration of the right second toe persists. I felt this might be infected last time I saw him which is one of the reasons we did the x-ray and the antibiotics. There is no palpable tenderness it is not particularly cool. General Notes: Wound exam; multiple wounds on the right lower leg mostly in the anterior and lateral areas. All of these things look a lot better. In fact there is a lot  of epithelialization. Only small areas of the original wounds are still open. ooThe right second toe  wound on the tip of the toe is also closed. The toe was discolored I am not exactly sure why. Not obviously ischemic and not obviously infected after a course of antibiotics and x-rays. Integumentary (Hair, Skin) Wound #1 status is Open. Original cause of wound was Gradually Appeared. The wound is located on the Right,Distal,Posterior Lower Leg. The wound measures 1.3cm length x 0.6cm width x 0.1cm depth; 0.613cm^2 area and 0.061cm^3 volume. The wound is limited to skin breakdown. There is no tunneling or undermining noted. There is a small amount of serosanguineous drainage noted. The wound margin is flat and intact. There is large (67-100%) red granulation within the wound bed. There is no necrotic tissue within the wound bed. Wound #3 status is Open. Original cause of wound was Gradually Appeared. The wound is located on the Right,Lateral Lower Leg. The wound measures 1.6cm length x 0.8cm width x 0.1cm depth; 1.005cm^2 area and 0.101cm^3 volume. There is Fat Layer (Subcutaneous Tissue) Exposed exposed. There is no tunneling or undermining noted. There is a small amount of serosanguineous drainage noted. The wound margin is flat and intact. There is large (67-100%) red, pink granulation within the wound bed. There is no necrotic tissue within the wound bed. Wound #4 status is Healed - Epithelialized. Original cause of wound was Gradually Appeared. The wound is located on the Right,Proximal,Lateral Lower Leg. The wound measures 0cm length x 0cm width x 0cm depth; 0cm^2 area and 0cm^3 volume. There is no tunneling or undermining noted. There is a none present amount of drainage noted. The wound margin is indistinct and nonvisible. There is no granulation within the wound bed. There is no necrotic tissue within the wound bed. Wound #5 status is Open. Original cause of wound was Gradually Appeared. The wound is located on the Right,Distal,Lateral Lower Leg. The wound measures 1.8cm length x 1.3cm width  x 0.1cm depth; 1.838cm^2 area and 0.184cm^3 volume. There is Fat Layer (Subcutaneous Tissue) Exposed exposed. There is no tunneling or undermining noted. There is a small amount of serosanguineous drainage noted. The wound margin is flat and intact. There is large (67-100%) red granulation within the wound bed. There is no necrotic tissue within the wound bed. Wound #6 status is Open. Original cause of wound was Gradually Appeared. The wound is located on the Right,Anterior Lower Leg. The wound measures 0.3cm length x 0.5cm width x 0.1cm depth; 0.118cm^2 area and 0.012cm^3 volume. The wound is limited to skin breakdown. There is no tunneling or undermining noted. There is a small amount of serous drainage noted. The wound margin is flat and intact. There is small (1-33%) red granulation within the wound bed. There is no necrotic tissue within the wound bed. Wound #7 status is Open. Original cause of wound was Trauma. The wound is located on the Right T Second. The wound measures 0.1cm length x 0.1cm oe width x 0.1cm depth; 0.008cm^2 area and 0.001cm^3 volume. There is no tunneling or undermining noted. There is a none present amount of drainage noted. The wound margin is flat and intact. There is no granulation within the wound bed. There is no necrotic tissue within the wound bed. 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 There was a Three Layer Compression Therapy Procedure by Carlene Coria, RN. Post procedure Diagnosis Wound #1: Same as  Pre-Procedure Wound #3 Pre-procedure diagnosis of Wound #3 is a Diabetic Wound/Ulcer of the Lower Extremity located on the Right,Lateral Lower Leg . There was a Three Layer Compression Therapy Procedure by Carlene Coria, RN. Post procedure Diagnosis Wound #3: Same as Pre-Procedure Wound #5 Pre-procedure diagnosis of Wound #5 is a Diabetic Wound/Ulcer of the Lower Extremity located on the Right,Distal,Lateral Lower Leg . There was a Three Layer Compression Therapy Procedure by Carlene Coria, RN. Post procedure Diagnosis Wound #5: Same as Pre-Procedure Wound #6 Pre-procedure diagnosis of Wound #6 is a Diabetic Wound/Ulcer of the Lower Extremity located on the Right,Anterior Lower Leg . There was a Three Layer Compression Therapy Procedure by Carlene Coria, RN. Post procedure Diagnosis Wound #6: Same as Pre-Procedure Plan Follow-up Appointments: Return Appointment in 2 weeks. Nurse Visit: - 1 week Dressing Change Frequency: Wound #1 Right,Distal,Posterior Lower Leg: Do not change entire dressing for one week. Wound #3 Right,Lateral Lower Leg: Do not change entire dressing for one week. Wound #5 Right,Distal,Lateral Lower Leg: Do not change entire dressing for one week. Wound #6 Right,Anterior Lower Leg: Do not change entire dressing for one week. Wound #7 Right T Second: oe Change Dressing every other day. Skin Barriers/Peri-Wound Care: Moisturizing lotion Wound Cleansing: Wound #1 Right,Distal,Posterior Lower Leg: May shower with protection. Wound #3 Right,Lateral Lower Leg: May shower with protection. Wound #5 Right,Distal,Lateral Lower Leg: May shower with protection. Wound #6 Right,Anterior Lower Leg: May shower with protection. Wound #7 Right T Second: oe May shower with protection. Primary Wound Dressing: Wound #1 Right,Distal,Posterior Lower Leg: Calcium Alginate with Silver Wound #3 Right,Lateral Lower Leg: Calcium Alginate with Silver Wound #5 Right,Distal,Lateral  Lower Leg: Calcium Alginate with Silver Wound #6 Right,Anterior Lower Leg: Calcium Alginate with Silver Wound #7 Right T Second: oe Calcium Alginate with Silver Secondary Dressing: Wound #1 Right,Distal,Posterior Lower Leg: Dry Gauze ABD pad Wound #3 Right,Lateral Lower Leg: Dry Gauze ABD pad Wound #5 Right,Distal,Lateral Lower Leg: Dry Gauze ABD pad Wound #6 Right,Anterior Lower Leg: Dry Gauze ABD pad Wound #7 Right T Second: oe Kerlix/Rolled Gauze - secure with tape or netting Dry Gauze Edema Control: Wound #1 Right,Distal,Posterior Lower Leg: 3 Layer Compression System - Right Lower Extremity Wound #3 Right,Lateral Lower Leg: 3 Layer Compression System - Right Lower Extremity Wound #5 Right,Distal,Lateral Lower Leg: 3 Layer Compression System - Right Lower Extremity Wound #6 Right,Anterior Lower Leg: 3 Layer Compression System - Right Lower Extremity 1. Continue with silver alginate to all wound areas under 3 layer compression 2. I think this will be healed perhaps by the next time we are seeing him 3. Continue to monitor the right second toe Electronic Signature(s) Signed: 12/12/2019 4:17:30 PM By: Linton Ham MD Entered By: Linton Ham on 12/11/2019 16:12:47 -------------------------------------------------------------------------------- SuperBill Details Patient Name: Date of Service: MO [name] 12/11/2019 Medical Record Number: [id number] Patient Account Number: [id number] Date of Birth/Sex: Treating RN: 1970/10/02 (49 y.o. [name]) Carlene Coria Primary Care Provider: PA [name], [place] Other Clinician: Referring Provider: Treating Provider/Extender: [name] in Treatment: 3 Diagnosis Coding ICD-10 Codes Code Description E11.621 Type 2 diabetes mellitus with foot ulcer E11.622 Type 2 diabetes mellitus with other  skin ulcer L97.818 Non-pressure chronic ulcer of other part of right lower leg with other specified severity L97.514  Non-pressure chronic ulcer of other part of right foot with necrosis of bone E11.42 Type 2 diabetes mellitus with diabetic polyneuropathy Facility Procedures CPT4 Code: [id number] Description: (Facility Use Only) [phone] - APPLY MULTLAY COMPRS LWR RT LEG Modifier: Quantity: 1 Physician Procedures : CPT4 Code Description Modifier [id number] - WC PHYS LEVEL 3 - EST PT ICD-10 Diagnosis Description E11.621 Type 2 diabetes mellitus with foot ulcer L97.818 Non-pressure chronic ulcer of other part of right lower leg with other specified severity Quantity: 1 Electronic Signature(s) Signed: 12/12/2019 4:17:30 PM By: Linton Ham MD Entered By: Linton Ham on 12/11/2019 16:13:08

## 2019-12-17 ENCOUNTER — Encounter (HOSPITAL_BASED_OUTPATIENT_CLINIC_OR_DEPARTMENT_OTHER): Payer: Self-pay | Admitting: Physician Assistant

## 2019-12-18 ENCOUNTER — Encounter (HOSPITAL_BASED_OUTPATIENT_CLINIC_OR_DEPARTMENT_OTHER): Payer: Self-pay | Admitting: Internal Medicine

## 2019-12-18 ENCOUNTER — Other Ambulatory Visit: Payer: Self-pay

## 2019-12-18 NOTE — Progress Notes (Signed)
JULLIEN, GRANQUIST ([id number]) Visit Report for 12/18/2019 SuperBill Details Patient Name: Date of Service: MO [name] 12/18/2019 Medical Record Number: [id number] Patient Account Number: [id number] Date of Birth/Sex: Treating RN: 09-13-1970 (49 y.o. 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

## 2019-12-20 NOTE — Progress Notes (Signed)
[name], Todd Mccoy ([id number]) Visit Report for 12/18/2019 Arrival Information Details Patient Name: Date of Service: [name] 12/18/2019 2:45 PM Medical Record Number: [id number] Patient Account Number: [id number] Date of Birth/Sex: Treating RN: August 13, 1970 (49 y.o. Todd Mccoy) Carlene Coria Primary Care Dacie Mandel: PA [name], [place] Other Clinician: Referring Briza Bark: Treating Priscila Bean/Extender: [name] in Treatment: 4 Visit Information History Since Last Visit Added or deleted any medications: No Patient Arrived: Ambulatory Any new allergies or adverse reactions: No Arrival Time: 14:54 Had a fall or experienced change in No Accompanied By: self activities of daily living that may affect Transfer Assistance: None risk of falls: Patient Identification Verified: Yes Signs or symptoms of abuse/neglect since last visito No Secondary Verification Process Completed: Yes Hospitalized since last visit: No Patient Requires Transmission-Based Precautions: No Implantable device outside of the clinic excluding No Patient Has Alerts: No cellular tissue based products placed in the center since last visit: Has Dressing in Place as Prescribed: Yes Pain Present Now: No Electronic Signature(s) Signed: 12/20/2019 12:45:44 PM By: Sandre Kitty Entered By: Sandre Kitty on 12/18/2019 14:55:24 -------------------------------------------------------------------------------- Compression Therapy Details Patient Name: Date of Service: [name] 12/18/2019 2:45 PM Medical Record Number: [id number] Patient Account Number: [id number] Date of Birth/Sex: Treating RN: 04-23-1971 (49 y.o. Todd Mccoy Primary Care Keriana Sarsfield: PA [name], [place] Other Clinician: Referring Carrianne Hyun: Treating Floree Zuniga/Extender: [name] in Treatment: 4 Compression Therapy Performed for Wound Assessment: Wound #1 Right,Distal,Posterior Lower Leg Performed By: Clinician  Kela Millin, RN Compression Type: Three Layer Electronic Signature(s) Signed: 12/18/2019 5:39:21 PM By: Kela Millin Entered By: Kela Millin on 12/18/2019 15:21:40 -------------------------------------------------------------------------------- Compression Therapy Details Patient Name: Date of Service: [name] 12/18/2019 2:45 PM Medical Record Number: [id number] Patient Account Number: [id number] Date of Birth/Sex: Treating RN: [birth year] (49 y.o. Todd Mccoy Primary Care Rielle Schlauch: PA [name], [place] Other Clinician: Referring Taquan Bralley: Treating Korayma Hagwood/Extender: [name] in Treatment: 4 Compression Therapy Performed for Wound Assessment: Wound #3 Right,Lateral Lower Leg Performed By: Clinician Kela Millin, RN Compression Type: Three Layer Electronic Signature(s) Signed: 12/18/2019 5:39:21 PM By: Kela Millin Entered By: Kela Millin on 12/18/2019 15:21:41 -------------------------------------------------------------------------------- Compression Therapy Details Patient Name: Date of Service: [name] 12/18/2019 2:45 PM Medical Record Number: [id number] Patient Account Number: [id number] Date of Birth/Sex: Treating RN: [birth year] (48 y.o. Todd Mccoy Primary Care Lenon Kuennen: PA [name], [place] Other Clinician: Referring Letizia Hook: Treating Duel Conrad/Extender: [name] in Treatment: 4 Compression Therapy Performed for Wound Assessment: Wound #5 Right,Distal,Lateral Lower Leg Performed By: Clinician Kela Millin, RN Compression Type: Three Layer Electronic Signature(s) Signed: 12/18/2019 5:39:21 PM By: Kela Millin Entered By: Kela Millin on 12/18/2019 15:21:41 -------------------------------------------------------------------------------- Compression Therapy Details Patient Name: Date of Service: [name] 12/18/2019 2:45 PM Medical Record Number:  [id number] Patient Account Number: [id number] Date of Birth/Sex: Treating RN: October 29, 1970 (49 y.o. Todd Mccoy Primary Care Tramel Westbrook: PA [name], [place] Other Clinician: Referring Sharece Fleischhacker: Treating Azion Centrella/Extender: [name] in Treatment: 4 Compression Therapy Performed for Wound Assessment: Wound #6 Right,Anterior Lower Leg Performed By: Clinician Kela Millin, RN Compression Type: Three Layer Electronic Signature(s) Signed: 12/18/2019 5:39:21 PM By: Kela Millin Entered By: Kela Millin on 12/18/2019 15:21:41 -------------------------------------------------------------------------------- Compression Therapy Details Patient Name: Date of Service: [name] 12/18/2019 2:45 PM Medical Record Number: [id number] Patient Account Number: [id number] Date of Birth/Sex:  Treating RN: 06/03/1971 (49 y.o. Todd Mccoy Primary Care [name]: PA [name], [place] Other Clinician: Referring Arneisha Kincannon: Treating Evanny Ellerbe/Extender: [name] in Treatment: 4 Compression Therapy Performed for Wound Assessment: Wound #7 Right T Second oe Performed By: Clinician Kela Millin, RN Compression Type: Three Layer Electronic Signature(s) Signed: 12/18/2019 5:39:21 PM By: Kela Millin Entered By: Kela Millin on 12/18/2019 15:21:41 -------------------------------------------------------------------------------- Encounter Discharge Information Details Patient Name: Date of Service: [name] 12/18/2019 2:45 PM Medical Record Number: [id number] Patient Account Number: [id number] Date of Birth/Sex: Treating RN: January 20, 1971 (49 y.o. Todd Mccoy Primary Care Todd Mccoy: PA [name], [place] Other Clinician: Referring Cyan Clippinger: Treating Terryann Verbeek/Extender: [name] in Treatment: 4 Encounter Discharge Information Items Discharge Condition: Stable Ambulatory Status: Ambulatory Discharge Destination:  Home Transportation: Private Auto Accompanied By: self Schedule Follow-up Appointment: Yes Clinical Summary of Care: Patient Declined Electronic Signature(s) Signed: 12/18/2019 5:39:21 PM By: Kela Millin Entered By: Kela Millin on 12/18/2019 15:22:49 -------------------------------------------------------------------------------- Patient/Caregiver Education Details Patient Name: Date of Service: [name] NA THA Robb Matar 6/15/2021andnbsp2:45 PM Medical Record Number: [id number] Patient Account Number: [id number] Date of Birth/Gender: Treating RN: 02-19-1971 (49 y.o. Todd Mccoy Primary Care Physician: PA [name], [place] Other Clinician: Referring Physician: Treating Physician/Extender: [name] in Treatment: 4 Education Assessment Education Provided To: Patient Education Topics Provided Wound/Skin Impairment: Handouts: Caring for Your Ulcer Methods: Explain/Verbal Responses: State content correctly Electronic Signature(s) Signed: 12/18/2019 5:39:21 PM By: Kela Millin Entered By: Kela Millin on 12/18/2019 15:22:37 -------------------------------------------------------------------------------- Wound Assessment Details Patient Name: Date of Service: [name] 12/18/2019 2:45 PM Medical Record Number: [id number] Patient Account Number: [id number] Date of Birth/Sex: Treating RN: July 15, 1970 (49 y.o. Todd Mccoy) Carlene Coria Primary Care Zaim Nitta: PA [name], [place] Other Clinician: Referring Sonita Michiels: Treating Anh Mangano/Extender: [name] in Treatment: 4 Wound Status Wound Number: 1 Primary Etiology: Diabetic Wound/Ulcer of the Lower Extremity Wound Location: Right, Distal, Posterior Lower Leg Secondary Etiology: Infection - not elsewhere classified Wounding Event: Gradually Appeared Wound Status: Open Date Acquired: 10/24/2019 Weeks Of Treatment: 4 Clustered Wound: No Wound Measurements Length: (cm) 1.3 Width: (cm)  0.6 Depth: (cm) 0.1 Area: (cm) 0.613 Volume: (cm) 0.061 % Reduction in Area: 96.3% % Reduction in Volume: 96.3% Wound Description Classification: Grade 1 Treatment Notes Wound #1 (Right, Distal, Posterior Lower Leg) 1. Cleanse With Wound Cleanser Soap and water 2. Periwound Care Moisturizing lotion 3. Primary Dressing Applied Calcium Alginate Ag 4. Secondary Dressing Dry Gauze 6. Support Layer Applied 3 layer compression wrap Notes netting Electronic Signature(s) Signed: 12/19/2019 9:54:21 AM By: Carlene Coria RN Signed: 12/20/2019 12:45:44 PM By: Sandre Kitty Entered By: Sandre Kitty on 12/18/2019 14:56:54 -------------------------------------------------------------------------------- Wound Assessment Details Patient Name: Date of Service: [name] 12/18/2019 2:45 PM Medical Record Number: [id number] Patient Account Number: [id number] Date of Birth/Sex: Treating RN: 06-30-1971 (49 y.o. Todd Mccoy) Carlene Coria Primary Care Suhayla Chisom: PA [name], [place] Other Clinician: Referring Antanette Richwine: Treating Shenelle Klas/Extender: [name] in Treatment: 4 Wound Status Wound Number: 3 Primary Etiology: Diabetic Wound/Ulcer of the Lower Extremity Wound Location: Right, Lateral Lower Leg Secondary Etiology: Infection - not elsewhere classified Wounding Event: Gradually Appeared Wound Status: Open Date Acquired: 10/24/2019 Weeks Of Treatment: 4 Clustered Wound: No Wound Measurements Length: (cm) 1.6 Width: (cm) 0.8 Depth: (cm) 0.1 Area: (cm) 1.005 Volume: (cm) 0.101 % Reduction in Area: 28.9% % Reduction in Volume: 28.4% Wound Description Classification: Grade 1 Treatment Notes Wound #3 (  Right, Lateral Lower Leg) 1. Cleanse With Wound Cleanser Soap and water 2. Periwound Care Moisturizing lotion 3. Primary Dressing Applied Calcium Alginate Ag 4. Secondary Dressing Dry Gauze 6. Support Layer Applied 3 layer compression  wrap Notes netting Electronic Signature(s) Signed: 12/19/2019 9:54:21 AM By: Carlene Coria RN Signed: 12/20/2019 12:45:44 PM By: Sandre Kitty Entered By: Sandre Kitty on 12/18/2019 14:56:54 -------------------------------------------------------------------------------- Wound Assessment Details Patient Name: Date of Service: [name] 12/18/2019 2:45 PM Medical Record Number: [id number] Patient Account Number: [id number] Date of Birth/Sex: Treating RN: 06/11/1971 (49 y.o. Todd Mccoy) Carlene Coria Primary Care Jaun Galluzzo: PA [name], [place] Other Clinician: Referring Lakeesha Fontanilla: Treating Adriella Essex/Extender: [name] in Treatment: 4 Wound Status Wound Number: 5 Primary Etiology: Diabetic Wound/Ulcer of the Lower Extremity Wound Location: Right, Distal, Lateral Lower Leg Secondary Etiology: Infection - not elsewhere classified Wounding Event: Gradually Appeared Wound Status: Open Date Acquired: 10/24/2019 Weeks Of Treatment: 4 Clustered Wound: No Wound Measurements Length: (cm) 1.8 Width: (cm) 1.3 Depth: (cm) 0.1 Area: (cm) 1.838 Volume: (cm) 0.184 % Reduction in Area: 56.7% % Reduction in Volume: 56.6% Wound Description Classification: Grade 1 Treatment Notes Wound #5 (Right, Distal, Lateral Lower Leg) 1. Cleanse With Wound Cleanser Soap and water 2. Periwound Care Moisturizing lotion 3. Primary Dressing Applied Calcium Alginate Ag 4. Secondary Dressing Dry Gauze 6. Support Layer Applied 3 layer compression wrap Notes netting Electronic Signature(s) Signed: 12/19/2019 9:54:21 AM By: Carlene Coria RN Signed: 12/20/2019 12:45:44 PM By: Sandre Kitty Entered By: Sandre Kitty on 12/18/2019 14:56:55 -------------------------------------------------------------------------------- Wound Assessment Details Patient Name: Date of Service: [name] 12/18/2019 2:45 PM Medical Record Number: [id number] Patient Account Number:  [id number] Date of Birth/Sex: Treating RN: [birth year] (49 y.o. Todd Mccoy) Carlene Coria Primary Care Tyrell Seifer: PA [name], [place] Other Clinician: Referring Zanden Colver: Treating Klare Criss/Extender: [name] in Treatment: 4 Wound Status Wound Number: 6 Primary Etiology: Diabetic Wound/Ulcer of the Lower Extremity Wound Location: Right, Anterior Lower Leg Secondary Etiology: Infection - not elsewhere classified Wounding Event: Gradually Appeared Wound Status: Open Date Acquired: 10/24/2019 Weeks Of Treatment: 4 Clustered Wound: No Wound Measurements Length: (cm) 0.3 Width: (cm) 0.5 Depth: (cm) 0.1 Area: (cm) 0.118 Volume: (cm) 0.012 % Reduction in Area: 98.8% % Reduction in Volume: 98.8% Wound Description Classification: Grade 1 Treatment Notes Wound #6 (Right, Anterior Lower Leg) 1. Cleanse With Wound Cleanser Soap and water 2. Periwound Care Moisturizing lotion 3. Primary Dressing Applied Calcium Alginate Ag 4. Secondary Dressing Dry Gauze 6. Support Layer Applied 3 layer compression wrap Notes netting Electronic Signature(s) Signed: 12/19/2019 9:54:21 AM By: Carlene Coria RN Signed: 12/20/2019 12:45:44 PM By: Sandre Kitty Entered By: Sandre Kitty on 12/18/2019 14:56:55 -------------------------------------------------------------------------------- Wound Assessment Details Patient Name: Date of Service: [name] 12/18/2019 2:45 PM Medical Record Number: [id number] Patient Account Number: [id number] Date of Birth/Sex: Treating RN: 06-29-1971 (49 y.o. Todd Mccoy) Carlene Coria Primary Care Yamaris Cummings: PA [name], [place] Other Clinician: Referring Deshara Rossi: Treating Leatta Alewine/Extender: [name] in Treatment: 4 Wound Status Wound Number: 7 Primary Etiology: Diabetic Wound/Ulcer of the Lower Extremity Wound Location: Right T Second oe Wound Status: Open Wounding Event: Trauma Date Acquired: 10/10/2019 Weeks Of Treatment: 4 Clustered Wound: No Wound  Measurements Length: (cm) 0.1 Width: (cm) 0.1 Depth: (cm) 0.1 Area: (cm) 0.008 Volume: (cm) 0.001 % Reduction in Area: 87.3% % Reduction in Volume: 96% Wound Description Classification: Grade 1 Treatment Notes Wound #7 (Right Toe Second) 1. Cleanse With  Wound Cleanser Soap and water 2. Periwound Care Moisturizing lotion 3. Primary Dressing Applied Calcium Alginate Ag 4. Secondary Dressing Dry Gauze 6. Support Layer Applied 3 layer compression wrap Notes netting Electronic Signature(s) Signed: 12/19/2019 9:54:21 AM By: Carlene Coria RN Signed: 12/20/2019 12:45:44 PM By: Sandre Kitty Entered By: Sandre Kitty on 12/18/2019 14:56:55 -------------------------------------------------------------------------------- Vitals Details Patient Name: Date of Service: [name] 12/18/2019 2:45 PM Medical Record Number: [id number] Patient Account Number: [id number] Date of Birth/Sex: Treating RN: Nov 12, 1970 (49 y.o. Todd Mccoy) Carlene Coria Primary Care Tomika Eckles: PA [name], [place] Other Clinician: Referring Coraima Tibbs: Treating Nicholos Aloisi/Extender: [name] in Treatment: 4 Vital Signs Time Taken: 14:56 Temperature (F): 98.8 Height (in): 70 Pulse (bpm): 98 Weight (lbs): 185 Respiratory Rate (breaths/min): 18 Body Mass Index (BMI): 26.5 Blood Pressure (mmHg): 168/89 Capillary Blood Glucose (mg/dl): 189 Reference Range: 80 - 120 mg / dl Electronic Signature(s) Signed: 12/20/2019 12:45:44 PM By: Sandre Kitty Entered By: Sandre Kitty on 12/18/2019 14:56:21

## 2019-12-25 ENCOUNTER — Encounter (HOSPITAL_BASED_OUTPATIENT_CLINIC_OR_DEPARTMENT_OTHER): Payer: Self-pay | Admitting: Internal Medicine

## 2019-12-28 ENCOUNTER — Encounter (HOSPITAL_BASED_OUTPATIENT_CLINIC_OR_DEPARTMENT_OTHER): Payer: Self-pay | Admitting: Internal Medicine

## 2019-12-28 ENCOUNTER — Other Ambulatory Visit: Payer: Self-pay

## 2019-12-28 NOTE — Progress Notes (Signed)
Todd, Mccoy ([id number]) Visit Report for 12/28/2019 Arrival Information Details Patient Name: Date of Service: [name] 12/28/2019 12:30 PM Medical Record Number: [id number] Patient Account Number: [id number] Date of Birth/Sex: Treating RN: [birth year] (49 y.o. Todd Mccoy, Meta.Reding Primary Care Wyonia Fontanella: PA [name], NO Other Clinician: Referring Shareeka Yim: Treating [name]/Extender: [name] in Treatment: 5 Visit Information History Since Last Visit Added or deleted any medications: No Patient Arrived: Ambulatory Any new allergies or adverse reactions: No Arrival Time: 12:38 Had a fall or experienced change in No Accompanied By: self activities of daily living that may affect Transfer Assistance: None risk of falls: Patient Identification Verified: Yes Signs or symptoms of abuse/neglect since last visito No Secondary Verification Process Completed: Yes Hospitalized since last visit: No Patient Requires Transmission-Based Precautions: No Implantable device outside of the clinic excluding No Patient Has Alerts: No cellular tissue based products placed in the center since last visit: Has Dressing in Place as Prescribed: Yes Has Compression in Place as Prescribed: Yes Pain Present Now: No Electronic Signature(s) Signed: 12/28/2019 5:05:03 PM By: Deon Pilling Entered By: Deon Pilling on 12/28/2019 12:50:35 -------------------------------------------------------------------------------- Clinic Level of Care Assessment Details Patient Name: Date of Service: [name] 12/28/2019 12:30 PM Medical Record Number: [id number] Patient Account Number: [id number] Date of Birth/Sex: Treating RN: Sep 05, 1970 (49 y.o. Todd Mccoy Primary Care Sindhu Nguyen: PA TIENT, NO Other Clinician: Referring Arayna Illescas: Treating Laressa Bolinger/Extender: [name] in Treatment: 5 Clinic Level of Care Assessment Items TOOL 4 Quantity Score X- 1  0 Use when only an EandM is performed on FOLLOW-UP visit ASSESSMENTS - Nursing Assessment / Reassessment X- 1 10 Reassessment of Co-morbidities (includes updates in patient status) X- 1 5 Reassessment of Adherence to Treatment Plan ASSESSMENTS - Wound and Skin A ssessment / Reassessment X - Simple Wound Assessment / Reassessment - one wound 1 5 []  - 0 Complex Wound Assessment / Reassessment - multiple wounds []  - 0 Dermatologic / Skin Assessment (not related to wound area) ASSESSMENTS - Focused Assessment X- 1 5 Circumferential Edema Measurements - multi extremities []  - 0 Nutritional Assessment / Counseling / Intervention []  - 0 Lower Extremity Assessment (monofilament, tuning fork, pulses) []  - 0 Peripheral Arterial Disease Assessment (using hand held doppler) ASSESSMENTS - Ostomy and/or Continence Assessment and Care []  - 0 Incontinence Assessment and Management []  - 0 Ostomy Care Assessment and Management (repouching, etc.) PROCESS - Coordination of Care X - Simple Patient / Family Education for ongoing care 1 15 []  - 0 Complex (extensive) Patient / Family Education for ongoing care X- 1 10 Staff obtains Programmer, systems, Records, T Results / Process Orders est []  - 0 Staff telephones HHA, Nursing Homes / Clarify orders / etc []  - 0 Routine Transfer to another Facility (non-emergent condition) []  - 0 Routine Hospital Admission (non-emergent condition) []  - 0 New Admissions / Biomedical engineer / Ordering NPWT Apligraf, etc. , []  - 0 Emergency Hospital Admission (emergent condition) X- 1 10 Simple Discharge Coordination []  - 0 Complex (extensive) Discharge Coordination PROCESS - Special Needs []  - 0 Pediatric / Minor Patient Management []  - 0 Isolation Patient Management []  - 0 Hearing / Language / Visual special needs []  - 0 Assessment of Community assistance (transportation, D/C planning, etc.) []  - 0 Additional assistance / Altered mentation []  -  0 Support Surface(s) Assessment (bed, cushion, seat, etc.) INTERVENTIONS - Wound Cleansing / Measurement X - Simple Wound Cleansing - one wound  1 5 []  - 0 Complex Wound Cleansing - multiple wounds X- 1 5 Wound Imaging (photographs - any number of wounds) []  - 0 Wound Tracing (instead of photographs) X- 1 5 Simple Wound Measurement - one wound []  - 0 Complex Wound Measurement - multiple wounds INTERVENTIONS - Wound Dressings X - Small Wound Dressing one or multiple wounds 1 10 []  - 0 Medium Wound Dressing one or multiple wounds []  - 0 Large Wound Dressing one or multiple wounds X- 1 5 Application of Medications - topical []  - 0 Application of Medications - injection INTERVENTIONS - Miscellaneous []  - 0 External ear exam []  - 0 Specimen Collection (cultures, biopsies, blood, body fluids, etc.) []  - 0 Specimen(s) / Culture(s) sent or taken to Lab for analysis []  - 0 Patient Transfer (multiple staff / Civil Service fast streamer / Similar devices) []  - 0 Simple Staple / Suture removal (25 or less) []  - 0 Complex Staple / Suture removal (26 or more) []  - 0 Hypo / Hyperglycemic Management (close monitor of Blood Glucose) []  - 0 Ankle / Brachial Index (ABI) - do not check if billed separately X- 1 5 Vital Signs Has the patient been seen at the hospital within the last three years: Yes Total Score: 95 Level Of Care: New/Established - Level 3 Electronic Signature(s) Signed: 12/28/2019 5:01:22 PM By: Kela Millin Entered By: Kela Millin on 12/28/2019 13:34:01 -------------------------------------------------------------------------------- Encounter Discharge Information Details Patient Name: Date of Service: [name] 12/28/2019 12:30 PM Medical Record Number: [id number] Patient Account Number: [id number] Date of Birth/Sex: Treating RN: 1970-08-28 (49 y.o. Todd Mccoy Primary Care Eulogio Requena: PA [name], NO Other Clinician: Referring Daxton Nydam: Treating  [name]/Extender: [name] in Treatment: 5 Encounter Discharge Information Items Discharge Condition: Stable Ambulatory Status: Ambulatory Discharge Destination: Home Transportation: Private Auto Accompanied By: alone Schedule Follow-up Appointment: Yes Clinical Summary of Care: Patient Declined Electronic Signature(s) Signed: 12/28/2019 5:45:26 PM By: Levan Hurst RN, [name] Entered By: Levan Hurst on 12/28/2019 15:20:02 -------------------------------------------------------------------------------- Lower Extremity Assessment Details Patient Name: Date of Service: [name] 12/28/2019 12:30 PM Medical Record Number: [id number] Patient Account Number: [id number] Date of Birth/Sex: Treating RN: Feb 03, 1971 (49 y.o. [name] Primary Care [name]: PA [name], NO Other Clinician: Referring Tamira Ryland: Treating Shoshanna Mcquitty/Extender: [name] in Treatment: 5 Edema Assessment Assessed: [Left: No] [Right: Yes] Edema: [Left: N] [Right: o] Calf Left: Right: Point of Measurement: 30 cm From Medial Instep cm 38 cm Ankle Left: Right: Point of Measurement: 10 cm From Medial Instep cm 21 cm Vascular Assessment Pulses: Dorsalis Pedis Palpable: [Right:Yes] Electronic Signature(s) Signed: 12/28/2019 5:05:03 PM By: Deon Pilling Entered By: Deon Pilling on 12/28/2019 12:51:15 -------------------------------------------------------------------------------- Multi Wound Chart Details Patient Name: Date of Service: [name] 12/28/2019 12:30 PM Medical Record Number: [id number] Patient Account Number: [id number] Date of Birth/Sex: Treating RN: [birth year] (49 y.o. Todd Mccoy Primary Care Mitul Hallowell: PA [name], NO Other Clinician: Referring Henley Blyth: Treating Rheya Minogue/Extender: [name] in Treatment: 5 Vital Signs Height(in): 70 Capillary Blood Glucose(mg/dl): 178 Weight(lbs): 185 Pulse(bpm): 69 Body Mass  Index(BMI): 27 Blood Pressure(mmHg): 154/99 Temperature(F): 98.2 Respiratory Rate(breaths/min): 16 Photos: [1:No Photos Right, Distal, Posterior Lower Leg] [3:No Photos Right, Lateral Lower Leg] [5:No Photos Right, Distal, Lateral Lower Leg] Wound Location: [1:Gradually Appeared] [3:Gradually Appeared] [5:Gradually Appeared] Wounding Event: [1:Diabetic Wound/Ulcer of the Lower] [3:Diabetic Wound/Ulcer of the Lower] [5:Diabetic Wound/Ulcer of the Lower] Primary Etiology: [1:Extremity Infection -  not elsewhere classified] [3:Extremity Infection - not elsewhere classified] [5:Extremity Infection - not elsewhere classified] Secondary Etiology: [1:Type II Diabetes, Neuropathy] [3:N/A] [5:N/A] Comorbid History: [1:10/24/2019] [3:10/24/2019] [5:10/24/2019] Date Acquired: [1:5] [3:5] [5:5] Weeks of Treatment: [1:Open] [3:Healed - Epithelialized] [5:Healed - Epithelialized] Wound Status: [1:0.2x0.2x0.1] [3:0x0x0] [5:0x0x0] Measurements L x W x D (cm) [1:0.031] [3:0] [5:0] A (cm) : rea [1:0.003] [3:0] [5:0] Volume (cm) : [1:99.80%] [3:100.00%] [5:100.00%] % Reduction in A rea: [1:99.80%] [3:100.00%] [5:100.00%] % Reduction in Volume: [1:Grade 1] [3:Grade 1] [5:Grade 1] Classification: [1:Small] [3:N/A] [5:N/A] Exudate A mount: [1:Serosanguineous] [3:N/A] [5:N/A] Exudate Type: [1:red, brown] [3:N/A] [5:N/A] Exudate Color: [1:Distinct, outline attached] [3:N/A] [5:N/A] Wound Margin: [1:Large (67-100%)] [3:N/A] [5:N/A] Granulation A mount: [1:Red] [3:N/A] [5:N/A] Granulation Quality: [1:None Present (0%)] [3:N/A] [5:N/A] Necrotic A mount: [1:Fat Layer (Subcutaneous Tissue)] [3:N/A] [5:N/A] Exposed Structures: [1:Exposed: Yes Fascia: No Tendon: No Muscle: No Joint: No Bone: No Large (67-100%)] [3:N/A] [5:N/A] Wound Number: 6 7 N/A Photos: No Photos No Photos N/A Right, Anterior Lower Leg Right T Second oe N/A Wound Location: Gradually Appeared Trauma N/A Wounding Event: Diabetic Wound/Ulcer of  the Lower Diabetic Wound/Ulcer of the Lower N/A Primary Etiology: Extremity Extremity Infection - not elsewhere classified N/A N/A Secondary Etiology: N/A Type II Diabetes, Neuropathy N/A Comorbid History: 10/24/2019 10/10/2019 N/A Date Acquired: 5 5 N/A Weeks of Treatment: Healed - Epithelialized Healed - Epithelialized N/A Wound Status: 0x0x0 0x0x0 N/A Measurements L x W x D (cm) 0 0 N/A A (cm) : rea 0 0 N/A Volume (cm) : 100.00% 100.00% N/A % Reduction in A rea: 100.00% 100.00% N/A % Reduction in Volume: Grade 1 Grade 1 N/A Classification: N/A None Present N/A Exudate A mount: N/A N/A N/A Exudate Type: N/A N/A N/A Exudate Color: N/A Distinct, outline attached N/A Wound Margin: N/A None Present (0%) N/A Granulation A mount: N/A N/A N/A Granulation Quality: N/A None Present (0%) N/A Necrotic A mount: N/A Fascia: No N/A Exposed Structures: Fat Layer (Subcutaneous Tissue) Exposed: No Tendon: No Muscle: No Joint: No Bone: No N/A Large (67-100%) N/A Epithelialization: Treatment Notes Electronic Signature(s) Signed: 12/28/2019 5:01:22 PM By: Kela Millin Signed: 12/28/2019 5:27:17 PM By: Linton Ham MD Entered By: Linton Ham on 12/28/2019 13:31:45 -------------------------------------------------------------------------------- Multi-Disciplinary Care Plan Details Patient Name: Date of Service: [name] 12/28/2019 12:30 PM Medical Record Number: [id number] Patient Account Number: [id number] Date of Birth/Sex: Treating RN: [birth year] (49 y.o. Todd Mccoy Primary Care Kamerin Axford: PA [name], NO Other Clinician: Referring Avien Taha: Treating Gaynell Eggleton/Extender: [name] in Treatment: 5 Active Inactive Wound/Skin Impairment Nursing Diagnoses: Knowledge deficit related to ulceration/compromised skin integrity Goals: Patient/caregiver will verbalize understanding of skin care regimen Date Initiated:  11/20/2019 Target Resolution Date: 01/25/2020 Goal Status: Active Ulcer/skin breakdown will have a volume reduction of 30% by week 4 Date Initiated: 11/20/2019 Target Resolution Date: 01/25/2020 Goal Status: Active Interventions: Assess patient/caregiver ability to obtain necessary supplies Assess patient/caregiver ability to perform ulcer/skin care regimen upon admission and as needed Assess ulceration(s) every visit Notes: Electronic Signature(s) Signed: 12/28/2019 5:01:22 PM By: Kela Millin Entered By: Kela Millin on 12/28/2019 12:52:00 -------------------------------------------------------------------------------- Pain Assessment Details Patient Name: Date of Service: [name] 12/28/2019 12:30 PM Medical Record Number: [id number] Patient Account Number: [id number] Date of Birth/Sex: Treating RN: 1970/08/22 (49 y.o. [name] Primary Care Maniah Nading: PA [name], NO Other Clinician: Referring Jonet Mathies: Treating Marquise Lambson/Extender: [name] in Treatment: 5 Active Problems Location of Pain Severity and Description of  Pain Patient Has Paino No Site Locations Pain Management and Medication Current Pain Management: Medication: No Cold Application: No Rest: No Massage: No Activity: No T.E.N.S.: No Heat Application: No Leg drop or elevation: No Is the Current Pain Management Adequate: Adequate How does your wound impact your activities of daily livingo Sleep: No Bathing: No Appetite: No Relationship With Others: No Bladder Continence: No Emotions: No Bowel Continence: No Work: No Toileting: No Drive: No Dressing: No Hobbies: No Electronic Signature(s) Signed: 12/28/2019 5:05:03 PM By: Deon Pilling Entered By: Deon Pilling on 12/28/2019 12:51:05 -------------------------------------------------------------------------------- Patient/Caregiver Education Details Patient Name: Date of Service: [name] NA THA Robb Matar  6/25/2021andnbsp12:30 PM Medical Record Number: [id number] Patient Account Number: [id number] Date of Birth/Gender: Treating RN: May 31, 1971 (49 y.o. Todd Mccoy Primary Care Physician: PA [name], NO Other Clinician: Referring Physician: Treating Physician/Extender: [name] in Treatment: 5 Education Assessment Education Provided To: Patient Education Topics Provided Wound/Skin Impairment: Handouts: Caring for Your Ulcer Methods: Explain/Verbal Responses: State content correctly Electronic Signature(s) Signed: 12/28/2019 5:01:22 PM By: Kela Millin Entered By: Kela Millin on 12/28/2019 12:52:15 -------------------------------------------------------------------------------- Wound Assessment Details Patient Name: Date of Service: [name] 12/28/2019 12:30 PM Medical Record Number: [id number] Patient Account Number: [id number] Date of Birth/Sex: Treating RN: [birth year] (49 y.o. [name] Primary Care Emmer Lillibridge: PA TIENT, NO Other Clinician: Referring Kirandeep Fariss: Treating Liberti Appleton/Extender: [name] in Treatment: 5 Wound Status Wound Number: 1 Primary Etiology: Diabetic Wound/Ulcer of the Lower Extremity Wound Location: Right, Distal, Posterior Lower Leg Secondary Etiology: Infection - not elsewhere classified Wounding Event: Gradually Appeared Wound Status: Open Date Acquired: 10/24/2019 Comorbid History: Type II Diabetes, Neuropathy Weeks Of Treatment: 5 Clustered Wound: No Wound Measurements Length: (cm) 0.2 Width: (cm) 0.2 Depth: (cm) 0.1 Area: (cm) 0.031 Volume: (cm) 0.003 % Reduction in Area: 99.8% % Reduction in Volume: 99.8% Epithelialization: Large (67-100%) Tunneling: No Undermining: No Wound Description Classification: Grade 1 Wound Margin: Distinct, outline attached Exudate Amount: Small Exudate Type: Serosanguineous Exudate Color: red, brown Foul Odor After Cleansing:  No Slough/Fibrino No Wound Bed Granulation Amount: Large (67-100%) Exposed Structure Granulation Quality: Red Fascia Exposed: No Necrotic Amount: None Present (0%) Fat Layer (Subcutaneous Tissue) Exposed: Yes Tendon Exposed: No Muscle Exposed: No Joint Exposed: No Bone Exposed: No Treatment Notes Wound #1 (Right, Distal, Posterior Lower Leg) 1. Cleanse With Wound Cleanser 2. Periwound Care Moisturizing lotion 3. Primary Dressing Applied Calcium Alginate Ag 4. Secondary Dressing Foam Border Dressing Electronic Signature(s) Signed: 12/28/2019 5:05:03 PM By: Deon Pilling Entered By: Deon Pilling on 12/28/2019 12:49:46 -------------------------------------------------------------------------------- Wound Assessment Details Patient Name: Date of Service: [name] 12/28/2019 12:30 PM Medical Record Number: [id number] Patient Account Number: [id number] Date of Birth/Sex: Treating RN: 1971/07/03 (49 y.o. [name] Primary Care Edrick Whitehorn: PA TIENT, NO Other Clinician: Referring Zsofia Prout: Treating Eren Puebla/Extender: [name] in Treatment: 5 Wound Status Wound Number: 3 Primary Etiology: Diabetic Wound/Ulcer of the Lower Extremity Wound Location: Right, Lateral Lower Leg Secondary Etiology: Infection - not elsewhere classified Wounding Event: Gradually Appeared Wound Status: Healed - Epithelialized Date Acquired: 10/24/2019 Weeks Of Treatment: 5 Clustered Wound: No Wound Measurements Length: (cm) Width: (cm) Depth: (cm) Area: (cm) Volume: (cm) 0 % Reduction in Area: 100% 0 % Reduction in Volume: 100% 0 0 0 Wound Description Classification: Grade 1 Electronic Signature(s) Signed: 12/28/2019 5:05:03 PM By: Deon Pilling Entered By: Deon Pilling on 12/28/2019 12:49:09 -------------------------------------------------------------------------------- Wound Assessment Details Patient  Name: Date of Service: [name] [place] THA N  S. 12/28/2019 12:30 PM Medical Record Number: [id number] Patient Account Number: [id number] Date of Birth/Sex: Treating RN: Jun 19, 1971 (49 y.o. [name] Primary Care Ninel Abdella: PA TIENT, NO Other Clinician: Referring Vaness Jelinski: Treating Sanora Cunanan/Extender: [name] in Treatment: 5 Wound Status Wound Number: 5 Primary Etiology: Diabetic Wound/Ulcer of the Lower Extremity Wound Location: Right, Distal, Lateral Lower Leg Secondary Etiology: Infection - not elsewhere classified Wounding Event: Gradually Appeared Wound Status: Healed - Epithelialized Date Acquired: 10/24/2019 Weeks Of Treatment: 5 Clustered Wound: No Wound Measurements Length: (cm) Width: (cm) Depth: (cm) Area: (cm) Volume: (cm) 0 % Reduction in Area: 100% 0 % Reduction in Volume: 100% 0 0 0 Wound Description Classification: Grade 1 Electronic Signature(s) Signed: 12/28/2019 5:05:03 PM By: Deon Pilling Entered By: Deon Pilling on 12/28/2019 12:49:09 -------------------------------------------------------------------------------- Wound Assessment Details Patient Name: Date of Service: [name] 12/28/2019 12:30 PM Medical Record Number: [id number] Patient Account Number: [id number] Date of Birth/Sex: Treating RN: 09-Mar-1971 (49 y.o. [name] Primary Care Jaiden Wahab: PA TIENT, NO Other Clinician: Referring Ivan Lacher: Treating Dorman Calderwood/Extender: [name] in Treatment: 5 Wound Status Wound Number: 6 Primary Etiology: Diabetic Wound/Ulcer of the Lower Extremity Wound Location: Right, Anterior Lower Leg Secondary Etiology: Infection - not elsewhere classified Wounding Event: Gradually Appeared Wound Status: Healed - Epithelialized Date Acquired: 10/24/2019 Weeks Of Treatment: 5 Clustered Wound: No Wound Measurements Length: (cm) Width: (cm) Depth: (cm) Area: (cm) Volume: (cm) 0 % Reduction in Area: 100% 0 % Reduction in Volume:  100% 0 0 0 Wound Description Classification: Grade 1 Electronic Signature(s) Signed: 12/28/2019 5:05:03 PM By: Deon Pilling Entered By: Deon Pilling on 12/28/2019 12:49:09 -------------------------------------------------------------------------------- Wound Assessment Details Patient Name: Date of Service: [name] 12/28/2019 12:30 PM Medical Record Number: [id number] Patient Account Number: [id number] Date of Birth/Sex: Treating RN: Dec 20, 1970 (49 y.o. [name] Primary Care Linell Shawn: PA TIENT, NO Other Clinician: Referring Arnet Hofferber: Treating Dillard Pascal/Extender: [name] in Treatment: 5 Wound Status Wound Number: 7 Primary Etiology: Diabetic Wound/Ulcer of the Lower Extremity Wound Location: Right T Second oe Wound Status: Healed - Epithelialized Wounding Event: Trauma Comorbid History: Type II Diabetes, Neuropathy Date Acquired: 10/10/2019 Weeks Of Treatment: 5 Clustered Wound: No Wound Measurements Length: (cm) Width: (cm) Depth: (cm) Area: (cm) Volume: (cm) 0 % Reduction in Area: 100% 0 % Reduction in Volume: 100% 0 Epithelialization: Large (67-100%) 0 Tunneling: No 0 Undermining: No Wound Description Classification: Grade 1 Wound Margin: Distinct, outline attached Exudate Amount: None Present Wound Bed Granulation Amount: None Present (0%) Exposed Structure Necrotic Amount: None Present (0%) Fascia Exposed: No Fat Layer (Subcutaneous Tissue) Exposed: No Tendon Exposed: No Muscle Exposed: No Joint Exposed: No Bone Exposed: No Electronic Signature(s) Signed: 12/28/2019 5:05:03 PM By: Deon Pilling Entered By: Deon Pilling on 12/28/2019 12:51:22 -------------------------------------------------------------------------------- [place] Details Patient Name: Date of Service: [name] 12/28/2019 12:30 PM Medical Record Number: [id number] Patient Account Number: [id number] Date of Birth/Sex: Treating  RN: Oct 27, 1970 (49 y.o. [name] Primary Care Desmund Elman: PA TIENT, NO Other Clinician: Referring Brittini Brubeck: Treating Kameron Blethen/Extender: [name] in Treatment: 5 Vital Signs Time Taken: 12:39 Temperature (F): 98.2 Height (in): 70 Pulse (bpm): 98 Weight (lbs): 185 Respiratory Rate (breaths/min): 16 Body Mass Index (BMI): 26.5 Blood Pressure (mmHg): 154/99 Capillary Blood Glucose (mg/dl): 178 Reference Range: 80 - 120 mg / dl Electronic Signature(s) Signed:  12/28/2019 5:05:03 PM By: Deon Pilling Entered By: Deon Pilling on 12/28/2019 12:50:57

## 2019-12-28 NOTE — Progress Notes (Signed)
KARANVEER, RAMAKRISHNAN ([id number]) Visit Report for 12/28/2019 HPI Details Patient Name: Date of Service: MO [name] 12/28/2019 12:30 PM Medical Record Number: [id number] Patient Account Number: [id number] Date of Birth/Sex: Treating RN: [birth year] (49 y.o. [name] Primary Care Provider: PA [name], NO Other Clinician: Referring Provider: Treating Provider/Extender: [name] in Treatment: 5 History of Present Illness HPI Description: ADMISSION 11/20/2019; this is a 49 year old man with poorly controlled type 2 diabetes. Roughly 3 to 4 weeks ago he noted swelling of his leg open wounds with draining sores. This happened fairly suddenly. He was seen in the ER on 11/07/2019 noted to have wounds of the right lower extremity x2 weeks at that point. An x-ray of the area was negative one of the wounds was cultured showing MSSA and group B strep. He was noted to have multiple wounds. He was given a course of doxycycline for 7 days white count was 8.4 hemoglobin 9.7. Since then he has been washing the leg and applying dry gauze. He is here in the clinic for review of this. 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 At that point his culture was MRSA. No no 11/27/19-Patient returns after starting in the clinic for open leg wounds on his right and his right second toe. We have been applying silver alginate and 3 layer compression on the right. Patient completed course of doxycycline. The wounds are looking slightly smaller, the right posterior wound appears to have healed 6/8; patient is here in follow-up for open wounds on his right leg and his right second toe. X-ray I did last time was strangely negative. I gave him a course of Keflex which she is completed the area on the tip of the second toe is actually close over. All of the  wounds on his right anterior lateral and posterior leg are considerably better. There is still open wounds on the right lateral and right posterior but they are measuring better. These were felt to be abscesses for which she was hospitalized in early May. Cultures originally showed MSSA and group B strep 6/25; patient is here for review of wounds on the right lower leg which were felt to be abscesses. He also had an area on his left second toe when he came in this is closed over. Almost all the areas on the right leg are closed 2 small open areas remain. We use silver alginate to these wounds and let them wear his own stockings. 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 12/28/2019 12:30 PM Medical Record Number: [id number] Patient Account Number: [id number] Date of Birth/Sex: Treating RN: 05/18/1971 (49 y.o. [name] Primary Care Provider: PA [name], NO Other Clinician: Referring Provider: Treating Provider/Extender: [name] in Treatment: 5 Constitutional Patient is hypertensive.. Pulse regular and within target range for patient.Marland Kitchen Respirations regular, non-labored and within target range.. Temperature is normal and within the target range for the patient.Marland Kitchen Appears in no distress. Respiratory work of breathing is normal. Musculoskeletal No palpable tenderness of the left second toe.. Notes Wound exam; multiple wounds on the right lower leg are almost completely epithelialized. Only  2 areas on the anterior and posterior areas have anything open. He has good edema control. The original area on the tip of the tip of the second toe is also closed. This is dark but not obviously ischemic  or infected. Plain x-ray was negative. There is absolutely no pain Electronic Signature(s) Signed: 12/28/2019 5:27:17 PM By: Linton Ham MD Entered By: Linton Ham on 12/28/2019 13:34:40 -------------------------------------------------------------------------------- Physician Orders Details Patient Name: Date of Service: MO [name] 12/28/2019 12:30 PM Medical Record Number: [id number] Patient Account Number: [id number] Date of Birth/Sex: Treating RN: Sep 26, 1970 (49 y.o. 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 Dressing Change Frequency Wound #1 Right,Distal,Posterior Lower Leg Change Dressing every other day. Skin Barriers/Peri-Wound Care Moisturizing lotion Wound Cleansing Wound #1 Right,Distal,Posterior Lower Leg May shower and wash wound with soap and water. Primary Wound Dressing Wound #1 Right,Distal,Posterior Lower Leg Calcium Alginate with Silver Secondary Dressing Wound #1 Right,Distal,Posterior Lower Leg Foam Border Edema Control Wound #1 Right,Distal,Posterior Lower Leg Support Garment 20-30 mm/Hg pressure to: - stockings to bilateral lower legs Electronic Signature(s) Signed: 12/28/2019 5:01:22 PM By: Kela Millin Signed: 12/28/2019 5:27:17 PM By: Linton Ham MD Entered By: Kela Millin on 12/28/2019  13:22:20 -------------------------------------------------------------------------------- Problem List Details Patient Name: Date of Service: MO [name] 12/28/2019 12:30 PM Medical Record Number: [id number] Patient Account Number: [id number] Date of Birth/Sex: Treating RN: 1970-09-17 (49 y.o. [name] Primary Care Provider: PA [name], NO Other Clinician: Referring Provider: Treating Provider/Extender: [name] in Treatment: 5 Active Problems ICD-10 Encounter Code Description Active Date MDM Diagnosis E11.621 Type 2 diabetes mellitus with foot ulcer 11/20/2019 No Yes E11.622 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 ulcer 11/20/2019 No Yes L97.818 Non-pressure chronic ulcer of other part of right lower leg with other specified 11/20/2019 No Yes severity L97.514 Non-pressure chronic ulcer of other part of right foot with necrosis of bone 11/20/2019 No Yes E11.42 Type 2 diabetes mellitus with diabetic polyneuropathy 11/20/2019 No Yes Inactive Problems Resolved Problems Electronic Signature(s) Signed: 12/28/2019 5:27:17 PM By: Linton Ham MD Entered By: Linton Ham on 12/28/2019 13:31:32 -------------------------------------------------------------------------------- Progress Note Details Patient Name: Date of Service: MO [name] 12/28/2019 12:30 PM Medical Record Number: [id number] Patient Account Number: [id number] Date of Birth/Sex: Treating RN: 05-Jan-1971 (49 y.o. [name] Primary Care Provider: PA [name], NO Other Clinician: Referring Provider: Treating Provider/Extender: [name] in Treatment: 5 Subjective History of Present Illness (HPI) ADMISSION 11/20/2019; this is a 49 year old man with poorly controlled type 2 diabetes. Roughly 3 to 4 weeks ago he noted swelling of his leg open wounds with draining sores. This happened fairly suddenly. He was seen in the ER on 11/07/2019 noted to have  wounds of the right lower extremity x2 weeks at that point. An x-ray of the area was negative one of the wounds was cultured showing MSSA and group B strep. He was noted to have multiple wounds. He  was given a course of doxycycline for 7 days white count was 8.4 hemoglobin 9.7. Since then he has been washing the leg and applying dry gauze. He is here in the clinic for review of this. 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 At that point his culture was MRSA. No no 11/27/19-Patient returns after starting in the clinic for open leg wounds on his right and his right second toe. We have been applying silver alginate and 3 layer compression on the right. Patient completed course of doxycycline. The wounds are looking slightly smaller, the right posterior wound appears to have healed 6/8; patient is here in follow-up for open wounds on his right leg and his right second toe. X-ray I did last time was strangely negative. I gave him a course of Keflex which she is completed the area on the tip of the second toe is actually close over. All of the wounds on his right anterior lateral and posterior leg are considerably better. There is still open wounds on the right lateral and right posterior but they are measuring better. These were felt to be abscesses for which she was hospitalized in early May. Cultures originally showed MSSA and group B strep 6/25; patient is here for review of wounds on the right lower leg which were felt to be abscesses. He also had an area on his left second toe when he came in this is closed over. Almost all the areas on the right leg are closed 2 small open areas remain. We use silver alginate to these wounds and let them wear his own stockings. Should be closed the next time he is here Objective Constitutional Patient is hypertensive.. Pulse  regular and within target range for patient.Marland Kitchen Respirations regular, non-labored and within target range.. Temperature is normal and within the target range for the patient.Marland Kitchen Appears in no distress. Vitals Time Taken: 12:39 PM, Height: 70 in, Weight: 185 lbs, BMI: 26.5, Temperature: 98.2 F, Pulse: 98 bpm, Respiratory Rate: 16 breaths/min, Blood Pressure: 154/99 mmHg, Capillary Blood Glucose: 178 mg/dl. Respiratory work of breathing is normal. Musculoskeletal No palpable tenderness of the left second toe.. General Notes: Wound exam; multiple wounds on the right lower leg are almost completely epithelialized. Only 2 areas on the anterior and posterior areas have anything open. He has good edema control. ooThe original area on the tip of the tip of the second toe is also closed. This is dark but not obviously ischemic or infected. Plain x-ray was negative. There is absolutely no pain Integumentary (Hair, Skin) Wound #1 status is Open. Original cause of wound was Gradually Appeared. The wound is located on the Right,Distal,Posterior Lower Leg. The wound measures 0.2cm length x 0.2cm width x 0.1cm depth; 0.031cm^2 area and 0.003cm^3 volume. There is Fat Layer (Subcutaneous Tissue) Exposed exposed. There is no tunneling or undermining noted. There is a small amount of serosanguineous drainage noted. The wound margin is distinct with the outline attached to the wound base. There is large (67-100%) red granulation within the wound bed. There is no necrotic tissue within the wound bed. Wound #3 status is Healed - Epithelialized. Original cause of wound was Gradually Appeared. The wound is located on the Right,Lateral Lower Leg. The wound measures 0cm length x 0cm width x 0cm depth; 0cm^2 area and 0cm^3 volume. Wound #5 status is Healed - Epithelialized.  Original cause of wound was Gradually Appeared. The wound is located on the Right,Distal,Lateral Lower Leg. The wound measures 0cm length x 0cm width x  0cm depth; 0cm^2 area and 0cm^3 volume. Wound #6 status is Healed - Epithelialized. Original cause of wound was Gradually Appeared. The wound is located on the Right,Anterior Lower Leg. The wound measures 0cm length x 0cm width x 0cm depth; 0cm^2 area and 0cm^3 volume. Wound #7 status is Healed - Epithelialized. Original cause of wound was Trauma. The wound is located on the Right T Second. The wound measures 0cm oe length x 0cm width x 0cm depth; 0cm^2 area and 0cm^3 volume. There is no tunneling or undermining noted. There is a none present amount of drainage noted. The wound margin is distinct with the outline attached to the wound base. There is no granulation within the wound bed. There is no necrotic tissue within the wound bed. 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 Dressing Change Frequency: Wound #1 Right,Distal,Posterior Lower Leg: Change Dressing every other day. Skin Barriers/Peri-Wound Care: Moisturizing lotion Wound Cleansing: Wound #1 Right,Distal,Posterior Lower Leg: May shower and wash wound with soap and water. Primary Wound Dressing: Wound #1 Right,Distal,Posterior Lower Leg: Calcium Alginate with Silver Secondary Dressing: Wound #1 Right,Distal,Posterior Lower Leg: Foam Border Edema Control: Wound #1 Right,Distal,Posterior Lower Leg: Support Garment 20-30 mm/Hg pressure to: - stockings to bilateral lower legs 1. Continue with silver alginate to the 2 small open areas with border foam he can wear his own compression stocking. This is on the right anterior lateral and right posterior lateral lower  extremities. 2. The second toe on the right side is discolored. I am not really sure why. It does not particularly look ischemic the wound is closed there is no tenderness. At this point I would simply watch this over time. X-ray did not show underlying deep infection Electronic Signature(s) Signed: 12/28/2019 5:27:17 PM By: Linton Ham MD Entered By: Linton Ham on 12/28/2019 13:37:06 -------------------------------------------------------------------------------- SuperBill Details Patient Name: Date of Service: MO [name] 12/28/2019 Medical Record Number: [id number] Patient Account Number: [id number] Date of Birth/Sex: Treating RN: June 24, 1971 (49 y.o. 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 E11.42 Type 2 diabetes mellitus with diabetic polyneuropathy Facility Procedures CPT4 Code: [id number] Description: 99213 - WOUND CARE VISIT-LEV 3 EST PT Modifier: Quantity: 1 Physician Procedures : CPT4 Code Description Modifier [id number] - WC PHYS LEVEL 3 - EST PT ICD-10 Diagnosis Description E11.621 Type 2 diabetes mellitus with foot ulcer E11.622 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 ulcer L97.818 Non-pressure chronic ulcer of other  part of right lower leg with other specified severity Quantity: 1 Electronic Signature(s) Signed: 12/28/2019 5:27:17 PM By: Linton Ham MD Entered By: Linton Ham on 12/28/2019 13:37:31

## 2020-01-04 ENCOUNTER — Encounter (HOSPITAL_BASED_OUTPATIENT_CLINIC_OR_DEPARTMENT_OTHER): Payer: Self-pay | Attending: Internal Medicine | Admitting: Internal Medicine

## 2020-03-18 ENCOUNTER — Other Ambulatory Visit: Payer: Self-pay

## 2020-03-18 DIAGNOSIS — F17210 Nicotine dependence, cigarettes, uncomplicated: Secondary | ICD-10-CM | POA: Insufficient documentation

## 2020-03-18 DIAGNOSIS — Z794 Long term (current) use of insulin: Secondary | ICD-10-CM | POA: Insufficient documentation

## 2020-03-18 DIAGNOSIS — E1165 Type 2 diabetes mellitus with hyperglycemia: Secondary | ICD-10-CM | POA: Insufficient documentation

## 2020-03-18 DIAGNOSIS — H538 Other visual disturbances: Secondary | ICD-10-CM | POA: Insufficient documentation

## 2020-03-18 NOTE — ED Notes (Signed)
Called x1. No answer.

## 2020-03-19 ENCOUNTER — Encounter (HOSPITAL_COMMUNITY): Payer: Self-pay | Admitting: Emergency Medicine

## 2020-03-19 ENCOUNTER — Emergency Department (HOSPITAL_COMMUNITY)
Admission: EM | Admit: 2020-03-19 | Discharge: 2020-03-19 | Disposition: A | Discharge status: Home / Self Care | Payer: Self-pay | Attending: Emergency Medicine | Admitting: Emergency Medicine

## 2020-03-19 DIAGNOSIS — H538 Other visual disturbances: Secondary | ICD-10-CM

## 2020-03-19 LAB — CBC WITH DIFFERENTIAL/PLATELET
Abs Immature Granulocytes: 0.07 10*3/uL (ref 0.00–0.07)
Basophils Absolute: 0 10*3/uL (ref 0.0–0.1)
Basophils Relative: 0 %
Eosinophils Absolute: 0.1 10*3/uL (ref 0.0–0.5)
Eosinophils Relative: 1 %
HCT: 35.6 % — ABNORMAL LOW (ref 39.0–52.0)
Hemoglobin: 10.9 g/dL — ABNORMAL LOW (ref 13.0–17.0)
Immature Granulocytes: 1 %
Lymphocytes Relative: 15 %
Lymphs Abs: 1.6 10*3/uL (ref 0.7–4.0)
MCH: 26.5 pg (ref 26.0–34.0)
MCHC: 30.6 g/dL (ref 30.0–36.0)
MCV: 86.4 fL (ref 80.0–100.0)
Monocytes Absolute: 0.8 10*3/uL (ref 0.1–1.0)
Monocytes Relative: 7 %
Neutro Abs: 7.9 10*3/uL — ABNORMAL HIGH (ref 1.7–7.7)
Neutrophils Relative %: 76 %
Platelets: 243 10*3/uL (ref 150–400)
RBC: 4.12 MIL/uL — ABNORMAL LOW (ref 4.22–5.81)
RDW: 13.4 % (ref 11.5–15.5)
WBC: 10.5 10*3/uL (ref 4.0–10.5)
nRBC: 0 % (ref 0.0–0.2)

## 2020-03-19 LAB — COMPREHENSIVE METABOLIC PANEL
ALT: 9 U/L (ref 0–44)
AST: 12 U/L — ABNORMAL LOW (ref 15–41)
Albumin: 2.3 g/dL — ABNORMAL LOW (ref 3.5–5.0)
Alkaline Phosphatase: 88 U/L (ref 38–126)
Anion gap: 11 (ref 5–15)
BUN: 22 mg/dL — ABNORMAL HIGH (ref 6–20)
CO2: 24 mmol/L (ref 22–32)
Calcium: 8.5 mg/dL — ABNORMAL LOW (ref 8.9–10.3)
Chloride: 99 mmol/L (ref 98–111)
Creatinine, Ser: 1.39 mg/dL — ABNORMAL HIGH (ref 0.61–1.24)
GFR calc Af Amer: >60 mL/min (ref >60)
GFR calc non Af Amer: 60 mL/min — ABNORMAL LOW (ref >60)
Glucose, Bld: 213 mg/dL — ABNORMAL HIGH (ref 70–99)
Potassium: 3.6 mmol/L (ref 3.5–5.1)
Sodium: 134 mmol/L — ABNORMAL LOW (ref 135–145)
Total Bilirubin: 0.5 mg/dL (ref 0.3–1.2)
Total Protein: 6.6 g/dL (ref 6.5–8.1)

## 2020-03-19 LAB — CBG MONITORING, ED: Glucose-Capillary: 186 mg/dL — ABNORMAL HIGH (ref 70–99)

## 2020-03-19 MED ORDER — TETRACAINE HCL 0.5 % OP SOLN
1.0000 [drp] | Freq: Once | OPHTHALMIC | Status: AC
  Administered 2020-03-19: 1 [drp] via OPHTHALMIC
  Filled 2020-03-19: qty 4

## 2020-03-19 MED ORDER — FLUORESCEIN SODIUM 1 MG OP STRP
1.0000 | ORAL_STRIP | Freq: Once | OPHTHALMIC | Status: AC
  Administered 2020-03-19: 1 via OPHTHALMIC
  Filled 2020-03-19: qty 1

## 2020-03-19 NOTE — ED Provider Notes (Signed)
[facility] DEPT Provider Note   CSN: [id number] Arrival date & time: 03/18/20  2125     History Chief Complaint  Patient presents with  . Eye Pain  . Blurred Vision    Terron Merfeld is a 49 y.o. male.  HPI     This is a 49 year old male with a history of diabetes who presents with blurry vision.  Patient reports 4 to 5-day history of worsening blurry vision left eye greater than right.  He states that he has some discomfort in the left eye that has started over the last 3 days.  He is also noted some clear drainage from that eye.  He does not normally wear contacts and occasionally wears reading glasses.  He does not have an eye doctor.  He denies any trauma to the eye.  Denies any visual field cuts.  He does not have a foreign body sensation.  No history of the same.  Patient describes that everything is blurry and "if I see the light on the highway it is 10 times brighter than it should be."  Patient states that he has noted in general over the last month that he has had some intermittent blurry vision.  Past Medical History:  Diagnosis Date  . Diabetes mellitus without complication [facility])     Patient Active Problem List   Diagnosis Date Noted  . Necrotizing fasciitis ([place]) 09/08/2014  . Diabetes mellitus with hyperglycemia ([place]) 09/07/2014  . Tobacco abuse 09/07/2014  . Abscess of back   . Abscess of lower back 09/06/2014  . DM2 (diabetes mellitus, type 2) ([place]) 09/06/2014  . Sepsis ([place]) 09/06/2014  . Scrotal abscess 06/20/2014    Past Surgical History:  Procedure Laterality Date  . INCISION AND DRAINAGE ABSCESS Right 09/07/2014   Procedure: INCISION AND DRAINAGE ABSCESS RIGHT FLANK;  Surgeon: Jackolyn Confer, MD;  Location: [facility] ORS;  Service: General;  Laterality: Right;  . SCROTUM EXPLORATION         Family History  Problem Relation Age of Onset  . Diabetes Mother   . Diabetes Father   . Diabetes Brother     Social  History   Tobacco Use  . Smoking status: Current Every Day Smoker    Packs/day: 0.50    Types: Cigarettes  . Smokeless tobacco: Never Used  Substance Use Topics  . Alcohol use: No  . Drug use: No    Home Medications Prior to Admission medications   Medication Sig Start Date End Date Taking? Authorizing Provider  insulin aspart protamine- aspart (NOVOLOG MIX 70/30) (70-30) 100 UNIT/ML injection Inject 0.35 mLs (35 Units total) into the skin 2 (two) times daily. 10/14/14   Lorayne Marek, MD    Allergies    Patient has no known allergies.  Review of Systems   Review of Systems  Constitutional: Negative for fever.  Eyes: Positive for pain, discharge and visual disturbance. Negative for photophobia, redness and itching.  Respiratory: Negative for shortness of breath.   Cardiovascular: Negative for chest pain.  Gastrointestinal: Negative for abdominal pain.  Neurological: Positive for headaches. Negative for weakness and light-headedness.  All other systems reviewed and are negative.   Physical Exam Updated Vital Signs BP (!) 149/94 (BP Location: Left Arm)   Pulse 78   Temp 98.7 F (37.1 C) (Oral)   Resp 16   Ht 1.778 m (5\' 10" )   Wt 79.4 kg   SpO2 99%   BMI 25.11 kg/m   Physical Exam Vitals  and nursing note reviewed.  Constitutional:      Appearance: He is well-developed.  HENT:     Head: Normocephalic and atraumatic.     Nose: Nose normal.     Mouth/Throat:     Mouth: Mucous membranes are moist.  Eyes:     General: Lids are normal. Lids are everted, no foreign bodies appreciated. No allergic shiner, visual field deficit or scleral icterus.       Right eye: No foreign body or discharge.        Left eye: No foreign body or discharge.     Intraocular pressure: Right eye pressure is 11 mmHg. Left eye pressure is 16 mmHg. Measurements were taken using a handheld tonometer.    Extraocular Movements: Extraocular movements intact.     Right eye: No nystagmus.     Left  eye: No nystagmus.     Conjunctiva/sclera: Conjunctivae normal.     Right eye: Right conjunctiva is not injected. No chemosis, exudate or hemorrhage.    Left eye: Left conjunctiva is not injected. No chemosis, exudate or hemorrhage.    Pupils: Pupils are equal, round, and reactive to light.     Right eye: Pupil is reactive and not sluggish. No corneal abrasion or fluorescein uptake. Seidel exam negative.     Left eye: Pupil is sluggish. Pupil is reactive. No corneal abrasion or fluorescein uptake. Seidel exam negative.    Comments: See chart, 20/100 right eye, unable to assess left eye  Cardiovascular:     Rate and Rhythm: Normal rate and regular rhythm.  Pulmonary:     Effort: Pulmonary effort is normal. No respiratory distress.  Abdominal:     Palpations: Abdomen is soft.     Tenderness: There is no abdominal tenderness.  Musculoskeletal:     Cervical back: Neck supple.     Right lower leg: No edema.     Left lower leg: No edema.  Lymphadenopathy:     Cervical: No cervical adenopathy.  Skin:    General: Skin is warm and dry.  Neurological:     Mental Status: He is alert and oriented to person, place, and time.  Psychiatric:        Mood and Affect: Mood normal.     ED Results / Procedures / Treatments   Labs (all labs ordered are listed, but only abnormal results are displayed) Labs Reviewed  COMPREHENSIVE METABOLIC PANEL - Abnormal; Notable for the following components:      Result Value   Sodium 134 (*)    Glucose, Bld 213 (*)    BUN 22 (*)    Creatinine, Ser 1.39 (*)    Calcium 8.5 (*)    Albumin 2.3 (*)    AST 12 (*)    GFR calc non Af Amer 60 (*)    All other components within normal limits  CBC WITH DIFFERENTIAL/PLATELET - Abnormal; Notable for the following components:   RBC 4.12 (*)    Hemoglobin 10.9 (*)    HCT 35.6 (*)    Neutro Abs 7.9 (*)    All other components within normal limits  CBG MONITORING, ED - Abnormal; Notable for the following components:    Glucose-Capillary 186 (*)    All other components within normal limits    EKG None  Radiology No results found.  Procedures Procedures (including critical care time)  Medications Ordered in ED Medications  fluorescein ophthalmic strip 1 strip (has no administration in time range)  tetracaine (PONTOCAINE) 0.5 %  ophthalmic solution 1 drop (has no administration in time range)    ED Course  I have reviewed the triage vital signs and the nursing notes.  Pertinent labs & imaging results that were available during my care of the patient were reviewed by me and considered in my medical decision making (see chart for details).    MDM Rules/Calculators/A&P                           Patient presents with blurry vision.  Left greater than right.  He does not wear corrective lenses but occasionally wears reading glasses.  He is overall nontoxic and vital signs are reassuring.  He does have a history of diabetes.  Blood sugar 186.  At that level, would not suspect that it would be causing vision changes.  Given slight headache and left eye tearing and pain, question glaucoma.  He does have a more sluggish pupil on the left.  However, pressures are reassuring with a pressure of 11 on the right and 16 on the left.  No fluorescein uptake or concerns for corneal abrasion or ulcer.  No obvious foreign body.  Visual fields are intact and doubt acute stroke.  Patient presents subacute.  Feel he needs a formal eye exam and funduscopic exam.  I sent a message to the ophthalmologist on-call, Dr. Eulas Post.  He was provided with information regarding the patient.  Patient was provided with follow-up information with Dr. Eulas Post.  After history, exam, and medical workup I feel the patient has been appropriately medically screened and is safe for discharge home. Pertinent diagnoses were discussed with the patient. Patient was given return precautions.   Final Clinical Impression(s) / ED Diagnoses Final  diagnoses:  Blurred vision    Rx / DC Orders ED Discharge Orders    None       Addalynn Kumari, Barbette Hair, MD 03/19/20 [id number]

## 2020-03-19 NOTE — ED Triage Notes (Signed)
Patient states he is having blurry vision. Patient also says that he has eye pain that has been going on for a month. Patient states it goes away and then comes back.

## 2020-03-19 NOTE — Discharge Instructions (Signed)
You were seen today for blurry vision.  Your work-up here does not suggest any acute emergent process.  You need to follow-up very closely with ophthalmology for formal eye exam.

## 2020-03-19 NOTE — ED Notes (Signed)
Called x 2. No answer 

## 2020-03-24 ENCOUNTER — Emergency Department (HOSPITAL_COMMUNITY): Payer: Self-pay

## 2020-03-24 ENCOUNTER — Inpatient Hospital Stay (HOSPITAL_COMMUNITY)
Admission: EM | Admit: 2020-03-24 | Discharge: 2020-03-27 | DRG: 617 | Disposition: A | Discharge status: Home / Self Care | Payer: Self-pay | Attending: Internal Medicine | Admitting: Internal Medicine

## 2020-03-24 ENCOUNTER — Other Ambulatory Visit: Payer: Self-pay

## 2020-03-24 ENCOUNTER — Encounter (HOSPITAL_COMMUNITY): Payer: Self-pay | Admitting: Emergency Medicine

## 2020-03-24 DIAGNOSIS — E871 Hypo-osmolality and hyponatremia: Secondary | ICD-10-CM | POA: Diagnosis present

## 2020-03-24 DIAGNOSIS — N182 Chronic kidney disease, stage 2 (mild): Secondary | ICD-10-CM | POA: Diagnosis present

## 2020-03-24 DIAGNOSIS — E1165 Type 2 diabetes mellitus with hyperglycemia: Secondary | ICD-10-CM | POA: Diagnosis present

## 2020-03-24 DIAGNOSIS — M86171 Other acute osteomyelitis, right ankle and foot: Secondary | ICD-10-CM | POA: Diagnosis present

## 2020-03-24 DIAGNOSIS — Z794 Long term (current) use of insulin: Secondary | ICD-10-CM

## 2020-03-24 DIAGNOSIS — Z720 Tobacco use: Secondary | ICD-10-CM

## 2020-03-24 DIAGNOSIS — I129 Hypertensive chronic kidney disease with stage 1 through stage 4 chronic kidney disease, or unspecified chronic kidney disease: Secondary | ICD-10-CM | POA: Diagnosis present

## 2020-03-24 DIAGNOSIS — D631 Anemia in chronic kidney disease: Secondary | ICD-10-CM | POA: Diagnosis present

## 2020-03-24 DIAGNOSIS — L03031 Cellulitis of right toe: Secondary | ICD-10-CM

## 2020-03-24 DIAGNOSIS — F17210 Nicotine dependence, cigarettes, uncomplicated: Secondary | ICD-10-CM | POA: Diagnosis present

## 2020-03-24 DIAGNOSIS — E119 Type 2 diabetes mellitus without complications: Secondary | ICD-10-CM

## 2020-03-24 DIAGNOSIS — Z20822 Contact with and (suspected) exposure to covid-19: Secondary | ICD-10-CM | POA: Diagnosis present

## 2020-03-24 DIAGNOSIS — Z833 Family history of diabetes mellitus: Secondary | ICD-10-CM

## 2020-03-24 DIAGNOSIS — M869 Osteomyelitis, unspecified: Secondary | ICD-10-CM | POA: Diagnosis present

## 2020-03-24 DIAGNOSIS — E1152 Type 2 diabetes mellitus with diabetic peripheral angiopathy with gangrene: Secondary | ICD-10-CM | POA: Diagnosis present

## 2020-03-24 DIAGNOSIS — E1122 Type 2 diabetes mellitus with diabetic chronic kidney disease: Secondary | ICD-10-CM | POA: Diagnosis present

## 2020-03-24 DIAGNOSIS — E1169 Type 2 diabetes mellitus with other specified complication: Principal | ICD-10-CM | POA: Diagnosis present

## 2020-03-24 LAB — BASIC METABOLIC PANEL
Anion gap: 7 (ref 5–15)
BUN: 18 mg/dL (ref 6–20)
CO2: 26 mmol/L (ref 22–32)
Calcium: 7.9 mg/dL — ABNORMAL LOW (ref 8.9–10.3)
Chloride: 99 mmol/L (ref 98–111)
Creatinine, Ser: 1.27 mg/dL — ABNORMAL HIGH (ref 0.61–1.24)
GFR calc Af Amer: >60 mL/min (ref >60)
GFR calc non Af Amer: >60 mL/min (ref >60)
Glucose, Bld: 216 mg/dL — ABNORMAL HIGH (ref 70–99)
Potassium: 3.7 mmol/L (ref 3.5–5.1)
Sodium: 132 mmol/L — ABNORMAL LOW (ref 135–145)

## 2020-03-24 LAB — CBC WITH DIFFERENTIAL/PLATELET
Abs Immature Granulocytes: 0.06 10*3/uL (ref 0.00–0.07)
Basophils Absolute: 0 10*3/uL (ref 0.0–0.1)
Basophils Relative: 0 %
Eosinophils Absolute: 0.1 10*3/uL (ref 0.0–0.5)
Eosinophils Relative: 1 %
HCT: 27.4 % — ABNORMAL LOW (ref 39.0–52.0)
Hemoglobin: 8.7 g/dL — ABNORMAL LOW (ref 13.0–17.0)
Immature Granulocytes: 1 %
Lymphocytes Relative: 17 %
Lymphs Abs: 2 10*3/uL (ref 0.7–4.0)
MCH: 26.5 pg (ref 26.0–34.0)
MCHC: 31.8 g/dL (ref 30.0–36.0)
MCV: 83.5 fL (ref 80.0–100.0)
Monocytes Absolute: 1.2 10*3/uL — ABNORMAL HIGH (ref 0.1–1.0)
Monocytes Relative: 10 %
Neutro Abs: 8.2 10*3/uL — ABNORMAL HIGH (ref 1.7–7.7)
Neutrophils Relative %: 71 %
Platelets: 311 10*3/uL (ref 150–400)
RBC: 3.28 MIL/uL — ABNORMAL LOW (ref 4.22–5.81)
RDW: 13.5 % (ref 11.5–15.5)
WBC: 11.5 10*3/uL — ABNORMAL HIGH (ref 4.0–10.5)
nRBC: 0 % (ref 0.0–0.2)

## 2020-03-24 LAB — SEDIMENTATION RATE: Sed Rate: 126 mm/hr — ABNORMAL HIGH (ref 0–16)

## 2020-03-24 MED ORDER — SODIUM CHLORIDE 0.9 % IV SOLN: INTRAVENOUS | Status: DC

## 2020-03-24 MED ORDER — ONDANSETRON HCL 4 MG/2ML IJ SOLN
4.0000 mg | Freq: Four times a day (QID) | INTRAMUSCULAR | Status: DC | PRN
  Administered 2020-03-25 – 2020-03-26 (×2): 4 mg via INTRAVENOUS
  Filled 2020-03-24: qty 2

## 2020-03-24 MED ORDER — ONDANSETRON HCL 4 MG PO TABS: 4.0000 mg | ORAL_TABLET | Freq: Four times a day (QID) | ORAL | Status: DC | PRN

## 2020-03-24 MED ORDER — ACETAMINOPHEN 325 MG PO TABS: 650.0000 mg | ORAL_TABLET | Freq: Four times a day (QID) | ORAL | Status: DC | PRN

## 2020-03-24 MED ORDER — ENOXAPARIN SODIUM 40 MG/0.4ML ~~LOC~~ SOLN
40.0000 mg | SUBCUTANEOUS | Status: DC
  Administered 2020-03-25 – 2020-03-27 (×3): 40 mg via SUBCUTANEOUS
  Filled 2020-03-24 (×3): qty 0.4

## 2020-03-24 MED ORDER — PIPERACILLIN-TAZOBACTAM 3.375 G IVPB 30 MIN
3.3750 g | Freq: Once | INTRAVENOUS | Status: AC
  Administered 2020-03-24: 3.375 g via INTRAVENOUS
  Filled 2020-03-24: qty 50

## 2020-03-24 MED ORDER — ACETAMINOPHEN 650 MG RE SUPP: 650.0000 mg | Freq: Four times a day (QID) | RECTAL | Status: DC | PRN

## 2020-03-24 MED ORDER — VANCOMYCIN HCL 1500 MG/300ML IV SOLN
1500.0000 mg | Freq: Once | INTRAVENOUS | Status: AC
  Administered 2020-03-24: 1500 mg via INTRAVENOUS
  Filled 2020-03-24: qty 300

## 2020-03-24 MED ORDER — MORPHINE SULFATE (PF) 2 MG/ML IV SOLN
2.0000 mg | INTRAVENOUS | Status: DC | PRN
  Administered 2020-03-26 – 2020-03-27 (×3): 2 mg via INTRAVENOUS
  Filled 2020-03-24 (×3): qty 1

## 2020-03-24 MED ORDER — VANCOMYCIN HCL IN DEXTROSE 1-5 GM/200ML-% IV SOLN: 1000.0000 mg | Freq: Once | INTRAVENOUS | Status: DC

## 2020-03-24 MED ORDER — TRAZODONE HCL 50 MG PO TABS: 25.0000 mg | ORAL_TABLET | Freq: Every evening | ORAL | Status: DC | PRN

## 2020-03-24 MED ORDER — MAGNESIUM HYDROXIDE 400 MG/5ML PO SUSP
30.0000 mL | Freq: Every day | ORAL | Status: DC | PRN
  Administered 2020-03-26: 30 mL via ORAL
  Filled 2020-03-24: qty 30

## 2020-03-24 MED ORDER — PIPERACILLIN-TAZOBACTAM 3.375 G IVPB 30 MIN: 3.3750 g | Freq: Once | INTRAVENOUS | Status: DC

## 2020-03-24 MED ORDER — INSULIN ASPART PROT & ASPART (70-30 MIX) 100 UNIT/ML ~~LOC~~ SUSP: 35.0000 [IU] | Freq: Two times a day (BID) | SUBCUTANEOUS | Status: DC

## 2020-03-24 NOTE — Progress Notes (Signed)
Pharmacy Note   A consult was received from an ED physician for vancomycin per pharmacy dosing.    The patient's profile has been reviewed for ht/wt/allergies/indication/available labs.    A one time order has been placed for vancomycin 1500 mg IV x1 .    Further antibiotics/pharmacy consults should be ordered by admitting physician if indicated.                       Thank you,  Royetta Asal, PharmD, BCPS 03/24/2020 11:09 PM

## 2020-03-24 NOTE — H&P (Addendum)
[facility] [place] NAME: Todd Mccoy    MR#:  [id number]  DATE OF BIRTH:  09/19/1970  DATE OF ADMISSION:  03/24/2020  PRIMARY CARE PHYSICIAN: Patient, No Pcp Per   REQUESTING/REFERRING PHYSICIAN: Truddie Hidden, MD CHIEF COMPLAINT:   Chief Complaint  Patient presents with   Wound Infection    HISTORY OF PRESENT ILLNESS:  Todd Mccoy  is a 49 y.o. African-American male with a known history of diabetes mellitus on insulin, who presented to the emergency room with acute onset of worsening right second toe swelling with black discoloration erythema, warmth, tenderness and mild pain over the last 2-1/2 weeks.  He believes that he had a bruise prior to that stubbing the right second toe.  No fever or chills.  No chest pain or dyspnea or cough.  No nausea or vomiting or abdominal pain.  No chest pain or palpitations.  He has been taking his insulin regularly.  He continues to smoke half a pack of cigarettes per day.  No dysuria, oliguria or hematuria or flank pain.  No cough or wheezing or hemoptysis.  Upon presentation to the emergency room, heart rate was 109 with otherwise normal vital signs.  Later on blood pressure was up to 166/105 though.  Labs revealed mild hyponatremia of 132 with hyperglycemia of 216 and a creatinine of 1.27 close to previous level 5 days ago.  CBC showed WBC of 11.5 with relative neutrophilia and anemia with hemoglobin of 8.7 hematocrit of 27.4 compared to 10.9 and 35.65 days ago 9.7 and 31.3 on 11/07/2019.  Right foot x-ray showed erosive changes involving the distal aspect of the right second digit in keeping with septic arthritis/osteomyelitis and dislocation with near complete destruction of the distal phalanx and erosion of the middle phalanx with diffuse soft tissue swelling.  COVID-19 PCR is currently pending.  The patient was given IV vancomycin and Zosyn.  He will be admitted to a medical bed for further evaluation and  management.  PAST MEDICAL HISTORY:   Past Medical History:  Diagnosis Date   Diabetes mellitus without complication ([facility])   Tobacco abuse  PAST SURGICAL HISTORY:   Past Surgical History:  Procedure Laterality Date   INCISION AND DRAINAGE ABSCESS Right 09/07/2014   Procedure: INCISION AND DRAINAGE ABSCESS RIGHT FLANK;  Surgeon: Jackolyn Confer, MD;  Location: [facility];  Service: General;  Laterality: Right;   SCROTUM EXPLORATION      SOCIAL HISTORY:   Social History   Tobacco Use   Smoking status: Current Every Day Smoker    Packs/day: 0.50    Types: Cigarettes   Smokeless tobacco: Never Used  Substance Use Topics   Alcohol use: No    FAMILY HISTORY:   Family History  Problem Relation Age of Onset   Diabetes Mother    Diabetes Father    Diabetes Brother     DRUG ALLERGIES:  No Known Allergies  REVIEW OF SYSTEMS:   ROS As per history of present illness. All pertinent systems were reviewed above. Constitutional, HEENT, cardiovascular, respiratory, GI, GU, musculoskeletal, neuro, psychiatric, endocrine, integumentary and hematologic systems were reviewed and are otherwise negative/unremarkable except for positive findings mentioned above in the HPI.   MEDICATIONS AT HOME:   Prior to Admission medications   Medication Sig Start Date End Date Taking? Authorizing Provider  insulin aspart protamine- aspart (NOVOLOG MIX 70/30) (70-30) 100 UNIT/ML injection Inject 0.35 mLs (35 Units total) into the skin 2 (two)  times daily. 10/14/14   Lorayne Marek, MD      VITAL SIGNS:  Blood pressure (!) 146/90, pulse (!) 102, temperature 98.2 F (36.8 C), temperature source Oral, resp. rate 18, height 5\' 10"  (1.778 m), weight 77.1 kg, SpO2 100 %.  PHYSICAL EXAMINATION:  Physical Exam  GENERAL:  49 y.o.-year-old African-American male patient lying in the bed with no acute distress.  EYES: Pupils equal, round, reactive to light and accommodation. No scleral icterus.  Extraocular muscles intact.  HEENT: Head atraumatic, normocephalic. Oropharynx and nasopharynx clear.  NECK:  Supple, no jugular venous distention. No thyroid enlargement, no tenderness.  LUNGS: Normal breath sounds bilaterally, no wheezing, rales,rhonchi or crepitation. No use of accessory muscles of respiration.  CARDIOVASCULAR: Regular rate and rhythm, S1, S2 normal. No murmurs, rubs, or gallops.  ABDOMEN: Soft, nondistended, nontender. Bowel sounds present. No organomegaly or mass.  EXTREMITIES: Right ankle and foot 1+ to 2+ pitting edema with no cyanosis, or clubbing.  NEUROLOGIC: Cranial nerves II through XII are intact. Muscle strength 5/5 in all extremities. Sensation intact. Gait not checked.  PSYCHIATRIC: The patient is alert and oriented x 3.  Normal affect and good eye contact. SKIN: Right second toe swelling with proximal erythema and mild tenderness and distal black discoloration with softening of the distal phalanx that is fairly loose concerning for distal toe gangrene with generalized foot and ankle swelling and mild warmth without tenderness.      LABORATORY PANEL:   CBC Recent Labs  Lab 03/24/20 2223  WBC 11.5*  HGB 8.7*  HCT 27.4*  PLT 311   ------------------------------------------------------------------------------------------------------------------  Chemistries  Recent Labs  Lab 03/19/20 0505 03/19/20 0505 03/24/20 2223  NA 134*   < > 132*  K 3.6   < > 3.7  CL 99   < > 99  CO2 24   < > 26  GLUCOSE 213*   < > 216*  BUN 22*   < > 18  CREATININE 1.39*   < > 1.27*  CALCIUM 8.5*   < > 7.9*  AST 12*  --   --   ALT 9  --   --   ALKPHOS 88  --   --   BILITOT 0.5  --   --    < > = values in this interval not displayed.   ------------------------------------------------------------------------------------------------------------------  Cardiac Enzymes No results for input(s): TROPONINI in the last 168  hours. ------------------------------------------------------------------------------------------------------------------  RADIOLOGY:  DG Foot Complete Right  Result Date: 03/24/2020 CLINICAL DATA:  Right great toe osteomyelitis EXAM: RIGHT FOOT COMPLETE - 3+ VIEW COMPARISON:  None. FINDINGS: Three view radiograph right foot demonstrates near complete destruction of the distal phalanx of the second digit and erosive changes involving the distal aspect of middle phalanx of the second digit in keeping with changes of septic arthritis/osteomyelitis in this location. There is no fracture or dislocation. Remaining joint spaces appear preserved. No additional osseous erosion identified; in particular, no erosive changes identified involving the great toe. Mild diffuse soft tissue swelling of the right foot. IMPRESSION: Erosive changes involving the distal aspect of the right second digit in keeping with septic arthritis/osteomyelitis in this location with near complete destruction of the distal phalanx and erosion of the middle phalanx. No evidence of osteomyelitis involving the great toe. Diffuse soft tissue swelling.  None Electronically Signed   By: Fidela Salisbury MD   On: 03/24/2020 22:38      IMPRESSION AND PLAN:   1.  Right second toe severe  nonpurulent cellulitis with likely underlying distal phalangeal osteomyelitis and likely gangrene. -The patient will be admitted to a medical bed. -We will continue IV antibiotic therapy with vancomycin and Zosyn. -Pain management will be provided. -Podiatry consult will be obtained. -A consult was ordered and called to [facility].  Dr. Hardie Pulley is on call. -We will place the patient n.p.o. after midnight pending podiatry evaluation.  2.  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 -We will place the patient on supplement coverage with NovoLog. -We will continue his basal coverage that can be  cut down to half when the patient is n.p.o.  3.  Elevated blood pressure likely with hypertensive urgency. -The patient will be placed on as needed IV labetalol.  4.  Tobacco abuse. -The patient was counseled for smoking cessation and will receive further counseling here.  5.  DVT prophylaxis. -We will place the patient on subcutaneous Lovenox for now pending podiatry decision for any operative intervention.  All the records are reviewed and case discussed with ED provider. The plan of care was discussed in details with the patient (and family). I answered all questions. The patient agreed to proceed with the above mentioned plan. Further management will depend upon hospital course.   CODE STATUS: Full code  Status is: Inpatient  Remains inpatient appropriate because:Ongoing active pain requiring inpatient pain management, Ongoing diagnostic testing needed not appropriate for outpatient work up, Unsafe d/c plan, IV treatments appropriate due to intensity of illness or inability to take PO and Inpatient level of care appropriate due to severity of illness   Dispo: The patient is from: Home              Anticipated d/c is to: Home              Anticipated d/c date is: 3 days              Patient currently is not medically stable to d/c.   TOTAL TIME TAKING CARE OF THIS PATIENT: 55 minutes.    Christel Mormon M.D on 03/24/2020 at 11:22 PM  [facility]   From 7 PM-7 AM, contact night-coverage [email or web address]  CC: Primary care physician; Patient, No Pcp Per   Note: This dictation was prepared with Dragon dictation along with smaller phrase technology. Any transcriptional typo errors that result from this process are unintentional.

## 2020-03-24 NOTE — ED Provider Notes (Signed)
[facility] EMERGENCY DEPARTMENT Provider Note  CSN: [id number] Arrival date & time: 03/24/20 2021    History Chief Complaint  Patient presents with  . Wound Infection    HPI  Todd Mccoy is a 49 y.o. male with history of poorly controlled DM reports swelling and discoloration as well as occasional mild pain in his R second toe for the last few weeks. He states he had an infection in that toe earlier this year that was treated with improvement. He has not sought care for this infection until today. Denies any fevers. Pain is minimal due to neuropathy.    Past Medical History:  Diagnosis Date  . Diabetes mellitus without complication [facility])     Past Surgical History:  Procedure Laterality Date  . INCISION AND DRAINAGE ABSCESS Right 09/07/2014   Procedure: INCISION AND DRAINAGE ABSCESS RIGHT FLANK;  Surgeon: Jackolyn Confer, MD;  Location: [facility];  Service: General;  Laterality: Right;  . SCROTUM EXPLORATION      Family History  Problem Relation Age of Onset  . Diabetes Mother   . Diabetes Father   . Diabetes Brother     Social History   Tobacco Use  . Smoking status: Current Every Day Smoker    Packs/day: 0.50    Types: Cigarettes  . Smokeless tobacco: Never Used  Substance Use Topics  . Alcohol use: No  . Drug use: No     Home Medications Prior to Admission medications   Medication Sig Start Date End Date Taking? Authorizing Provider  insulin aspart protamine- aspart (NOVOLOG MIX 70/30) (70-30) 100 UNIT/ML injection Inject 0.35 mLs (35 Units total) into the skin 2 (two) times daily. 10/14/14   Lorayne Marek, MD     Allergies    Patient has no known allergies.   Review of Systems   Review of Systems A comprehensive review of systems was completed and negative except as noted in HPI.    Physical Exam BP (!) 146/90   Pulse (!) 102   Temp 98.2 F (36.8 C) (Oral)   Resp 18   Ht 5\' 10"  (1.778 m)   Wt 77.1 kg   SpO2 100%   BMI 24.39 kg/m    Physical Exam Vitals and nursing note reviewed.  Constitutional:      Appearance: Normal appearance.  HENT:     Head: Normocephalic and atraumatic.     Nose: Nose normal.     Mouth/Throat:     Mouth: Mucous membranes are moist.  Eyes:     Extraocular Movements: Extraocular movements intact.     Conjunctiva/sclera: Conjunctivae normal.  Cardiovascular:     Rate and Rhythm: Normal rate.  Pulmonary:     Effort: Pulmonary effort is normal.     Breath sounds: Normal breath sounds.  Abdominal:     General: Abdomen is flat.     Palpations: Abdomen is soft.     Tenderness: There is no abdominal tenderness.  Musculoskeletal:        General: No swelling. Normal range of motion.     Cervical back: Neck supple.     Comments: Partially necrotic R 2nd toe with erythema and induration proximally extending into the 1st and 2nd web spaces with skin breakdown. No significant proximal streaking. Good pulses, see photos below.   Skin:    General: Skin is warm and dry.  Neurological:     General: No focal deficit present.     Mental Status: He is alert.  Psychiatric:  Mood and Affect: Mood normal.          ED Results / Procedures / Treatments   Labs (all labs ordered are listed, but only abnormal results are displayed) Labs Reviewed  BASIC METABOLIC PANEL - Abnormal; Notable for the following components:      Result Value   Sodium 132 (*)    Glucose, Bld 216 (*)    Creatinine, Ser 1.27 (*)    Calcium 7.9 (*)    All other components within normal limits  CBC WITH DIFFERENTIAL/PLATELET - Abnormal; Notable for the following components:   WBC 11.5 (*)    RBC 3.28 (*)    Hemoglobin 8.7 (*)    HCT 27.4 (*)    Neutro Abs 8.2 (*)    Monocytes Absolute 1.2 (*)    All other components within normal limits  CULTURE, BLOOD (ROUTINE X 2)  CULTURE, BLOOD (ROUTINE X 2)  SARS CORONAVIRUS 2 BY RT PCR (HOSPITAL ORDER, [facility])  SEDIMENTATION RATE     EKG None  Radiology DG Foot Complete Right  Result Date: 03/24/2020 CLINICAL DATA:  Right great toe osteomyelitis EXAM: RIGHT FOOT COMPLETE - 3+ VIEW COMPARISON:  None. FINDINGS: Three view radiograph right foot demonstrates near complete destruction of the distal phalanx of the second digit and erosive changes involving the distal aspect of middle phalanx of the second digit in keeping with changes of septic arthritis/osteomyelitis in this location. There is no fracture or dislocation. Remaining joint spaces appear preserved. No additional osseous erosion identified; in particular, no erosive changes identified involving the great toe. Mild diffuse soft tissue swelling of the right foot. IMPRESSION: Erosive changes involving the distal aspect of the right second digit in keeping with septic arthritis/osteomyelitis in this location with near complete destruction of the distal phalanx and erosion of the middle phalanx. No evidence of osteomyelitis involving the great toe. Diffuse soft tissue swelling.  None Electronically Signed   By: Fidela Salisbury MD   On: 03/24/2020 22:38    Procedures Procedures  Medications Ordered in the ED Medications  piperacillin-tazobactam (ZOSYN) IVPB 3.375 g (has no administration in time range)  vancomycin (VANCOREADY) IVPB 1500 mg/300 mL (has no administration in time range)     MDM Rules/Calculators/A&P MDM Concern for soft tissue infection and/or osteomyelitis with necrosis of the toe. Will check labs including sed rate and cultures. Xray and anticipate admission for further management.  ED Course  I have reviewed the triage vital signs and the nursing notes.  Pertinent labs & imaging results that were available during my care of the patient were reviewed by me and considered in my medical decision making (see chart for details).  Clinical Course as of Mar 24 2317  Mon Mar 24, 2020  2251 CBC shows mildly elevated WBC and low Hgb worse than baseline.  BMP with mild hyperglycemia.    [CS]  2302 Xray images reviewed, consistent with osteomyelitis. Abx ordered for diabetic infection. Will discuss admission with Hospitalist.   [CS]  2317 Spoke with Dr. Sidney Ace, Hospitalist, who will evaluate the patient for admission.    [CS]    Clinical Course User Index [CS] Truddie Hidden, MD    Final Clinical Impression(s) / ED Diagnoses Final diagnoses:  Osteomyelitis of second toe of right foot [facility])    Rx / DC Orders ED Discharge Orders    None       Truddie Hidden, MD 03/24/20 2318

## 2020-03-24 NOTE — ED Triage Notes (Signed)
Pt reports increasing pain from second toe due to infection. Pt reports that wound has been under treatment for the last several weeks and last abx was 3-4 days ago.

## 2020-03-25 ENCOUNTER — Other Ambulatory Visit: Payer: Self-pay

## 2020-03-25 ENCOUNTER — Encounter (HOSPITAL_COMMUNITY)
Admission: EM | Disposition: A | Discharge status: Home / Self Care | Payer: Self-pay | Source: Home / Self Care | Attending: Internal Medicine

## 2020-03-25 ENCOUNTER — Inpatient Hospital Stay (HOSPITAL_COMMUNITY): Payer: Self-pay | Admitting: Certified Registered Nurse Anesthetist

## 2020-03-25 ENCOUNTER — Inpatient Hospital Stay (HOSPITAL_COMMUNITY): Payer: Self-pay

## 2020-03-25 ENCOUNTER — Ambulatory Visit: Admit: 2020-03-25 | Payer: Self-pay | Admitting: Podiatry

## 2020-03-25 ENCOUNTER — Encounter (HOSPITAL_COMMUNITY): Payer: Self-pay | Admitting: Family Medicine

## 2020-03-25 DIAGNOSIS — F17200 Nicotine dependence, unspecified, uncomplicated: Secondary | ICD-10-CM

## 2020-03-25 DIAGNOSIS — E11621 Type 2 diabetes mellitus with foot ulcer: Secondary | ICD-10-CM

## 2020-03-25 DIAGNOSIS — L02619 Cutaneous abscess of unspecified foot: Secondary | ICD-10-CM

## 2020-03-25 DIAGNOSIS — E1152 Type 2 diabetes mellitus with diabetic peripheral angiopathy with gangrene: Secondary | ICD-10-CM

## 2020-03-25 DIAGNOSIS — I96 Gangrene, not elsewhere classified: Secondary | ICD-10-CM

## 2020-03-25 DIAGNOSIS — M869 Osteomyelitis, unspecified: Secondary | ICD-10-CM

## 2020-03-25 DIAGNOSIS — I158 Other secondary hypertension: Secondary | ICD-10-CM

## 2020-03-25 DIAGNOSIS — L97509 Non-pressure chronic ulcer of other part of unspecified foot with unspecified severity: Secondary | ICD-10-CM

## 2020-03-25 DIAGNOSIS — L03119 Cellulitis of unspecified part of limb: Secondary | ICD-10-CM

## 2020-03-25 DIAGNOSIS — E11319 Type 2 diabetes mellitus with unspecified diabetic retinopathy without macular edema: Secondary | ICD-10-CM

## 2020-03-25 DIAGNOSIS — M86171 Other acute osteomyelitis, right ankle and foot: Secondary | ICD-10-CM

## 2020-03-25 HISTORY — PX: AMPUTATION TOE: SHX6595

## 2020-03-25 HISTORY — DX: Type 2 diabetes mellitus with unspecified diabetic retinopathy without macular edema: E11.319

## 2020-03-25 LAB — CBG MONITORING, ED
Glucose-Capillary: 215 mg/dL — ABNORMAL HIGH (ref 70–99)
Glucose-Capillary: 175 mg/dL — ABNORMAL HIGH (ref 70–99)

## 2020-03-25 LAB — BASIC METABOLIC PANEL
Anion gap: 12 (ref 5–15)
BUN: 18 mg/dL (ref 6–20)
CO2: 24 mmol/L (ref 22–32)
Calcium: 8.1 mg/dL — ABNORMAL LOW (ref 8.9–10.3)
Chloride: 100 mmol/L (ref 98–111)
Creatinine, Ser: 1.2 mg/dL (ref 0.61–1.24)
GFR calc Af Amer: >60 mL/min (ref >60)
GFR calc non Af Amer: >60 mL/min (ref >60)
Glucose, Bld: 223 mg/dL — ABNORMAL HIGH (ref 70–99)
Potassium: 3.8 mmol/L (ref 3.5–5.1)
Sodium: 136 mmol/L (ref 135–145)

## 2020-03-25 LAB — HEMOGLOBIN A1C
Hgb A1c MFr Bld: 11.1 % — ABNORMAL HIGH (ref 4.8–5.6)
Mean Plasma Glucose: 271.87 mg/dL

## 2020-03-25 LAB — CBC
HCT: 28.3 % — ABNORMAL LOW (ref 39.0–52.0)
Hemoglobin: 9.1 g/dL — ABNORMAL LOW (ref 13.0–17.0)
MCH: 26.7 pg (ref 26.0–34.0)
MCHC: 32.2 g/dL (ref 30.0–36.0)
MCV: 83 fL (ref 80.0–100.0)
Platelets: 323 10*3/uL (ref 150–400)
RBC: 3.41 MIL/uL — ABNORMAL LOW (ref 4.22–5.81)
RDW: 13.5 % (ref 11.5–15.5)
WBC: 11.3 10*3/uL — ABNORMAL HIGH (ref 4.0–10.5)
nRBC: 0 % (ref 0.0–0.2)

## 2020-03-25 LAB — GLUCOSE, CAPILLARY
Glucose-Capillary: 103 mg/dL — ABNORMAL HIGH (ref 70–99)
Glucose-Capillary: 114 mg/dL — ABNORMAL HIGH (ref 70–99)
Glucose-Capillary: 151 mg/dL — ABNORMAL HIGH (ref 70–99)

## 2020-03-25 LAB — HIV ANTIBODY (ROUTINE TESTING W REFLEX): HIV Screen 4th Generation wRfx: NONREACTIVE

## 2020-03-25 LAB — MRSA PCR SCREENING: MRSA by PCR: NEGATIVE

## 2020-03-25 LAB — SARS CORONAVIRUS 2 BY RT PCR (HOSPITAL ORDER, PERFORMED IN ~~LOC~~ HOSPITAL LAB): SARS Coronavirus 2: NEGATIVE

## 2020-03-25 SURGERY — AMPUTATION, TOE
Anesthesia: Monitor Anesthesia Care | Site: Toe | Laterality: Right

## 2020-03-25 MED ORDER — OXYCODONE HCL 5 MG PO TABS: 5.0000 mg | ORAL_TABLET | Freq: Once | ORAL | Status: DC | PRN

## 2020-03-25 MED ORDER — FENTANYL CITRATE (PF) 100 MCG/2ML IJ SOLN
INTRAMUSCULAR | Status: DC | PRN
Start: 2020-03-25 — End: 2020-03-25
  Administered 2020-03-25: 50 ug via INTRAVENOUS

## 2020-03-25 MED ORDER — FENTANYL CITRATE (PF) 100 MCG/2ML IJ SOLN
25.0000 ug | INTRAMUSCULAR | Status: DC | PRN
  Administered 2020-03-25 (×2): 25 ug via INTRAVENOUS

## 2020-03-25 MED ORDER — VANCOMYCIN HCL 1000 MG IV SOLR
INTRAVENOUS | Status: DC | PRN
  Administered 2020-03-25: 1000 mg via TOPICAL

## 2020-03-25 MED ORDER — LIDOCAINE-EPINEPHRINE 2 %-1:100000 IJ SOLN
INTRAMUSCULAR | Status: DC | PRN
  Administered 2020-03-25: 20 mL

## 2020-03-25 MED ORDER — FENTANYL CITRATE (PF) 100 MCG/2ML IJ SOLN
INTRAMUSCULAR | Status: AC
  Filled 2020-03-25: qty 2

## 2020-03-25 MED ORDER — INSULIN ASPART PROT & ASPART (70-30 MIX) 100 UNIT/ML ~~LOC~~ SUSP: 35.0000 [IU] | Freq: Two times a day (BID) | SUBCUTANEOUS | Status: DC

## 2020-03-25 MED ORDER — SODIUM CHLORIDE 0.9 % IV SOLN
2.0000 g | Freq: Three times a day (TID) | INTRAVENOUS | Status: DC
  Administered 2020-03-25 – 2020-03-27 (×5): 2 g via INTRAVENOUS
  Filled 2020-03-25 (×7): qty 2

## 2020-03-25 MED ORDER — BUPIVACAINE HCL (PF) 0.5 % IJ SOLN
INTRAMUSCULAR | Status: AC
  Filled 2020-03-25: qty 30

## 2020-03-25 MED ORDER — VANCOMYCIN HCL 750 MG/150ML IV SOLN
750.0000 mg | Freq: Two times a day (BID) | INTRAVENOUS | Status: DC
  Administered 2020-03-25: 750 mg via INTRAVENOUS
  Filled 2020-03-25: qty 150

## 2020-03-25 MED ORDER — PROPOFOL 10 MG/ML IV BOLUS
INTRAVENOUS | Status: AC
  Filled 2020-03-25: qty 20

## 2020-03-25 MED ORDER — VANCOMYCIN HCL IN DEXTROSE 1-5 GM/200ML-% IV SOLN
1000.0000 mg | Freq: Two times a day (BID) | INTRAVENOUS | Status: DC
  Administered 2020-03-25 – 2020-03-27 (×4): 1000 mg via INTRAVENOUS
  Filled 2020-03-25 (×4): qty 200

## 2020-03-25 MED ORDER — PROPOFOL 500 MG/50ML IV EMUL
INTRAVENOUS | Status: DC | PRN
  Administered 2020-03-25: 75 ug/kg/min via INTRAVENOUS

## 2020-03-25 MED ORDER — LACTATED RINGERS IV SOLN: INTRAVENOUS | Status: DC

## 2020-03-25 MED ORDER — INSULIN ASPART 100 UNIT/ML ~~LOC~~ SOLN
0.0000 [IU] | Freq: Three times a day (TID) | SUBCUTANEOUS | Status: DC
  Administered 2020-03-25: 5 [IU] via SUBCUTANEOUS
  Administered 2020-03-25 – 2020-03-26 (×3): 3 [IU] via SUBCUTANEOUS
  Administered 2020-03-26 (×2): 5 [IU] via SUBCUTANEOUS
  Filled 2020-03-25: qty 0.15

## 2020-03-25 MED ORDER — OXYCODONE HCL 5 MG/5ML PO SOLN: 5.0000 mg | Freq: Once | ORAL | Status: DC | PRN

## 2020-03-25 MED ORDER — LACTATED RINGERS IV SOLN: INTRAVENOUS | Status: DC | PRN

## 2020-03-25 MED ORDER — 0.9 % SODIUM CHLORIDE (POUR BTL) OPTIME
TOPICAL | Status: DC | PRN
  Administered 2020-03-25: 1000 mL

## 2020-03-25 MED ORDER — VANCOMYCIN HCL 1000 MG IV SOLR
INTRAVENOUS | Status: AC
  Filled 2020-03-25: qty 1000

## 2020-03-25 MED ORDER — PROPOFOL 500 MG/50ML IV EMUL
INTRAVENOUS | Status: AC
  Filled 2020-03-25: qty 50

## 2020-03-25 MED ORDER — MIDAZOLAM HCL 5 MG/5ML IJ SOLN
INTRAMUSCULAR | Status: DC | PRN
  Administered 2020-03-25 (×2): 1 mg via INTRAVENOUS

## 2020-03-25 MED ORDER — ONDANSETRON HCL 4 MG/2ML IJ SOLN
INTRAMUSCULAR | Status: AC
  Filled 2020-03-25: qty 2

## 2020-03-25 MED ORDER — PROPOFOL 10 MG/ML IV BOLUS
INTRAVENOUS | Status: DC | PRN
  Administered 2020-03-25 (×3): 20 mg via INTRAVENOUS
  Administered 2020-03-25: 10 mg via INTRAVENOUS

## 2020-03-25 MED ORDER — MIDAZOLAM HCL 2 MG/2ML IJ SOLN
INTRAMUSCULAR | Status: AC
  Filled 2020-03-25: qty 2

## 2020-03-25 MED ORDER — PIPERACILLIN-TAZOBACTAM 3.375 G IVPB
3.3750 g | Freq: Three times a day (TID) | INTRAVENOUS | Status: DC
  Administered 2020-03-25 (×2): 3.375 g via INTRAVENOUS
  Filled 2020-03-25 (×3): qty 50

## 2020-03-25 MED ORDER — BUPIVACAINE HCL (PF) 0.5 % IJ SOLN
INTRAMUSCULAR | Status: DC | PRN
  Administered 2020-03-25: 10 mL

## 2020-03-25 SURGICAL SUPPLY — 37 items
BLADE SURG 15 STRL LF DISP TIS (BLADE) IMPLANT
BLADE SURG 15 STRL SS (BLADE)
BNDG CONFORM 4 STRL LF (GAUZE/BANDAGES/DRESSINGS) ×3 IMPLANT
BNDG ELASTIC 3X5.8 VLCR STR LF (GAUZE/BANDAGES/DRESSINGS) ×3 IMPLANT
BNDG ELASTIC 4X5.8 VLCR STR LF (GAUZE/BANDAGES/DRESSINGS) IMPLANT
BNDG ELASTIC 6X5.8 VLCR STR LF (GAUZE/BANDAGES/DRESSINGS) IMPLANT
BNDG GAUZE ELAST 4 BULKY (GAUZE/BANDAGES/DRESSINGS) ×3 IMPLANT
CLEANER TIP ELECTROSURG 2X2 (MISCELLANEOUS) IMPLANT
CNTNR URN SCR LID CUP LEK RST (MISCELLANEOUS) IMPLANT
CONT SPEC 4OZ STRL OR WHT (MISCELLANEOUS)
COVER SURGICAL LIGHT HANDLE (MISCELLANEOUS) ×3 IMPLANT
COVER WAND RF STERILE (DRAPES) IMPLANT
CUFF TOURN SGL QUICK 24 (TOURNIQUET CUFF)
CUFF TRNQT CYL 24X4X16.5-23 (TOURNIQUET CUFF) IMPLANT
DECANTER SPIKE VIAL GLASS SM (MISCELLANEOUS) IMPLANT
GAUZE SPONGE 4X4 12PLY STRL (GAUZE/BANDAGES/DRESSINGS) ×3 IMPLANT
GAUZE XEROFORM 1X8 LF (GAUZE/BANDAGES/DRESSINGS) IMPLANT
GLOVE BIO SURGEON STRL SZ7.5 (GLOVE) ×3 IMPLANT
GLOVE BIOGEL PI IND STRL 8 (GLOVE) ×1 IMPLANT
GLOVE BIOGEL PI INDICATOR 8 (GLOVE) ×2
GOWN STRL REUS W/TWL XL LVL3 (GOWN DISPOSABLE) ×3 IMPLANT
KIT BASIN OR (CUSTOM PROCEDURE TRAY) ×3 IMPLANT
KIT TURNOVER KIT A (KITS) IMPLANT
NEEDLE HYPO 25X1 1.5 SAFETY (NEEDLE) ×3 IMPLANT
PACK ORTHO EXTREMITY (CUSTOM PROCEDURE TRAY) ×3 IMPLANT
PADDING UNDERCAST 2 STRL (CAST SUPPLIES)
PADDING UNDERCAST 2X4 STRL (CAST SUPPLIES) IMPLANT
PENCIL SMOKE EVACUATOR (MISCELLANEOUS) IMPLANT
SET IRRIG Y TYPE TUR BLADDER L (SET/KITS/TRAYS/PACK) ×3 IMPLANT
STAPLER VISISTAT 35W (STAPLE) ×3 IMPLANT
SUT ETHILON 4 0 PS 2 18 (SUTURE) ×3 IMPLANT
SUT VIC AB 4-0 PS2 27 (SUTURE) IMPLANT
SYR 20ML LL LF (SYRINGE) IMPLANT
TOWEL OR 17X26 10 PK STRL BLUE (TOWEL DISPOSABLE) ×3 IMPLANT
TOWEL OR NON WOVEN STRL DISP B (DISPOSABLE) ×3 IMPLANT
TRAY PREP A LATEX SAFE STRL (SET/KITS/TRAYS/PACK) ×3 IMPLANT
UNDERPAD 30X36 HEAVY ABSORB (UNDERPADS AND DIAPERS) ×6 IMPLANT

## 2020-03-25 NOTE — Progress Notes (Signed)
PROGRESS NOTE    Todd Mccoy  JTT:[id number]  DOB: 1970/12/23  PCP: Patient, No Pcp Per Admit date:03/24/2020 Chief compliant: Discolored toe 49 y.o. African-American male with a known history of diabetes mellitus on insulin, who presented to the emergency room with acute onset of worsening right second toe swelling with black discoloration erythema, warmth, tenderness and mild pain over the last 2-1/2 weeks.  He believes that he had a bruise prior to that stubbing the right second toe.  No fever or chills.  No chest pain or dyspnea or cough.  No nausea or vomiting or abdominal pain.  No chest pain or palpitations.  He has been taking his insulin regularly.  He continues to smoke half a pack of cigarettes per day.   ED Course: Afebrile, tachycardic with heart rate at 109, blood pressure 166/105, sodium 132, blood glucose 216, creatinine 1.27, WBC 11.5, hemoglobin 8.7,right foot x-ray showed erosive changes involving the distal aspect of the right second digit in keeping with septic arthritis/osteomyelitis and dislocation with near complete destruction of the distal phalanx and erosion of the middle phalanx with diffuse soft tissue swelling.   Hospital course: Patient admitted to [facility] for further evaluation and management with IV antibiotics-vancomycin and Zosyn   Subjective:  Patient still in the ED when seen in rounds this morning.  Denied any significant pain.  Seen by podiatry and plan to take to the OR today.    Objective: Vitals:   03/25/20 1833 03/25/20 1842 03/25/20 1845 03/25/20 1900  BP: (!) 91/59 105/72 105/69 112/73  Pulse:  90 89 90  Resp: 11 17 12 16   Temp: 98.4 F (36.9 C)     TempSrc:      SpO2: 100% 100% 97% 99%  Weight:      Height:        Intake/Output Summary (Last 24 hours) at 03/25/2020 1917 Last data filed at 03/25/2020 1823 Gross per 24 hour  Intake 400 ml  Output 805 ml  Net -405 ml   Filed Weights   03/24/20 2041 03/25/20 1616  Weight: 77.1 kg  79.4 kg    Physical Examination:   General: Moderately built, no acute distress noted Head ENT: Atraumatic normocephalic, PERRLA, neck supple Heart: S1-S2 heard, regular rate and rhythm, no murmurs.  No leg edema noted Lungs: Equal air entry bilaterally, no rhonchi or rales on exam, no accessory muscle use Abdomen: Bowel sounds heard, soft, nontender, nondistended. No organomegaly.  No CVA tenderness Extremities: No pedal edema.  No cyanosis or clubbing. Neurological: Awake alert oriented x3, no focal weakness or numbness, strength and sensations to crude touch intact Skin: Right foot edema with stasis dermatitis and second toe gangrenous/purulent changes as shown below t      Data Reviewed: I have personally reviewed following labs and imaging studies  CBC: Recent Labs  Lab 03/19/20 0505 03/24/20 2223 03/25/20 0436  WBC 10.5 11.5* 11.3*  NEUTROABS 7.9* 8.2*  --   HGB 10.9* 8.7* 9.1*  HCT 35.6* 27.4* 28.3*  MCV 86.4 83.5 83.0  PLT 243 311 009   Basic Metabolic Panel: Recent Labs  Lab 03/19/20 0505 03/24/20 2223 03/25/20 0436  NA 134* 132* 136  K 3.6 3.7 3.8  CL 99 99 100  CO2 24 26 24   GLUCOSE 213* 216* 223*  BUN 22* 18 18  CREATININE 1.39* 1.27* 1.20  CALCIUM 8.5* 7.9* 8.1*   GFR: Estimated Creatinine Clearance: 78.6 mL/min (by C-G formula based on SCr of 1.2 mg/dL). Liver  Function Tests: Recent Labs  Lab 03/19/20 0505  AST 12*  ALT 9  ALKPHOS 88  BILITOT 0.5  PROT 6.6  ALBUMIN 2.3*   No results for input(s): LIPASE, AMYLASE in the last 168 hours. No results for input(s): AMMONIA in the last 168 hours. Coagulation Profile: No results for input(s): INR, PROTIME in the last 168 hours. Cardiac Enzymes: No results for input(s): CKTOTAL, CKMB, CKMBINDEX, TROPONINI in the last 168 hours. BNP (last 3 results) No results for input(s): PROBNP in the last 8760 hours. HbA1C: Recent Labs    03/24/20 2223  HGBA1C 11.1*   CBG: Recent Labs  Lab  03/19/20 0036 03/25/20 0738 03/25/20 1152 03/25/20 1612 03/25/20 1841  GLUCAP 186* 215* 175* 103* 114*   Lipid Profile: No results for input(s): CHOL, HDL, LDLCALC, TRIG, CHOLHDL, LDLDIRECT in the last 72 hours. Thyroid Function Tests: No results for input(s): TSH, T4TOTAL, FREET4, T3FREE, THYROIDAB in the last 72 hours. Anemia Panel: No results for input(s): VITAMINB12, FOLATE, FERRITIN, TIBC, IRON, RETICCTPCT in the last 72 hours. Sepsis Labs: No results for input(s): PROCALCITON, LATICACIDVEN in the last 168 hours.  Recent Results (from the past 240 hour(s))  Culture, blood (routine x 2)     Status: None (Preliminary result)   Collection Time: 03/24/20 10:23 PM   Specimen: BLOOD LEFT HAND  Result Value Ref Range Status   Specimen Description   Final    BLOOD LEFT HAND Performed at [facility], [address] [place], [place] [address]    Special Requests   Final    BOTTLES DRAWN AEROBIC AND ANAEROBIC Blood Culture adequate volume Performed at [facility] [address] [place], [place] [address]    Culture   Final    NO GROWTH < 12 HOURS Performed at [facility] [address] [place], [place] [address]    Report Status PENDING  Incomplete  SARS Coronavirus 2 by RT PCR (hospital order, performed in [facility] hospital lab) Nasopharyngeal Nasopharyngeal Swab     Status: None   Collection Time: 03/24/20 11:19 PM   Specimen: Nasopharyngeal Swab  Result Value Ref Range Status   SARS Coronavirus 2 NEGATIVE NEGATIVE Final    Comment: (NOTE) SARS-CoV-2 target nucleic acids are NOT DETECTED.  The SARS-CoV-2 RNA is generally detectable in upper and lower respiratory specimens during the acute phase of infection. The lowest concentration of SARS-CoV-2 viral copies this assay can detect is 250 copies / mL. A negative result does not preclude SARS-CoV-2 infection and should not be used as the sole basis for treatment or other patient  management decisions.  A negative result may occur with improper specimen collection / handling, submission of specimen other than nasopharyngeal swab, presence of viral mutation(s) within the areas targeted by this assay, and inadequate number of viral copies (<250 copies / mL). A negative result must be combined with clinical observations, patient history, and epidemiological information.  Fact Sheet for Patients:   StrictlyIdeas.no  Fact Sheet for Healthcare Providers: BankingDealers.co.za  This test is not yet approved or  cleared by the [place] FDA and has been authorized for detection and/or diagnosis of SARS-CoV-2 by FDA under an Emergency Use Authorization (EUA).  This EUA will remain in effect (meaning this test can be used) for the duration of the COVID-19 declaration under Section 564(b)(1) of the Act, 21 U.S.C. section 360bbb-3(b)(1), unless the authorization is terminated or revoked sooner.  Performed at [facility], [place]  [address] [place], [place] [address]       Radiology Studies: DG Foot Complete Right  Result Date: 03/24/2020 CLINICAL DATA:  Right great toe osteomyelitis EXAM: RIGHT FOOT COMPLETE - 3+ VIEW COMPARISON:  None. FINDINGS: Three view radiograph right foot demonstrates near complete destruction of the distal phalanx of the second digit and erosive changes involving the distal aspect of middle phalanx of the second digit in keeping with changes of septic arthritis/osteomyelitis in this location. There is no fracture or dislocation. Remaining joint spaces appear preserved. No additional osseous erosion identified; in particular, no erosive changes identified involving the great toe. Mild diffuse soft tissue swelling of the right foot. IMPRESSION: Erosive changes involving the distal aspect of the right second digit in keeping with septic arthritis/osteomyelitis in this location with near  complete destruction of the distal phalanx and erosion of the middle phalanx. No evidence of osteomyelitis involving the great toe. Diffuse soft tissue swelling.  None Electronically Signed   By: Fidela Salisbury MD   On: 03/24/2020 22:38   VAS [place] ABI WITH/WO TBI  Result Date: 03/25/2020 LOWER EXTREMITY DOPPLER STUDY Indications: Gangrene. High Risk Factors: Diabetes. Other Factors: Gangrene second toe on Right foot.  Performing Technologist: Griffin Basil RCT RDMS  Examination Guidelines: A complete evaluation includes at minimum, Doppler waveform signals and systolic blood pressure reading at the level of bilateral brachial, anterior tibial, and posterior tibial arteries, when vessel segments are accessible. Bilateral testing is considered an integral part of a complete examination. Photoelectric Plethysmograph (PPG) waveforms and toe systolic pressure readings are included as required and additional duplex testing as needed. Limited examinations for reoccurring indications may be performed as noted.  ABI Findings: +---------+------------------+-----+---------+--------+ Right    Rt Pressure (mmHg)IndexWaveform Comment  +---------+------------------+-----+---------+--------+ Brachial 162                    triphasic         +---------+------------------+-----+---------+--------+ PTA      173               1.07 triphasic         +---------+------------------+-----+---------+--------+ DP       163               1.01 triphasic         +---------+------------------+-----+---------+--------+ Great Toe                       Absent            +---------+------------------+-----+---------+--------+ +---------+------------------+-----+---------+-------+ Left     Lt Pressure (mmHg)IndexWaveform Comment +---------+------------------+-----+---------+-------+ Brachial 160                    triphasic        +---------+------------------+-----+---------+-------+ PTA      173                1.07 triphasic        +---------+------------------+-----+---------+-------+ DP       170               1.05 triphasic        +---------+------------------+-----+---------+-------+ Great Toe                       Normal           +---------+------------------+-----+---------+-------+ +-------+-----------+-----------+------------+------------+ ABI/TBIToday's ABIToday's TBIPrevious ABIPrevious TBI +-------+-----------+-----------+------------+------------+ Right  1.07                                           +-------+-----------+-----------+------------+------------+  Left   1.07                                           +-------+-----------+-----------+------------+------------+ TOES Findings: +----------+---------------+--------+-------+ Right ToesPressure (mmHg)WaveformComment +----------+---------------+--------+-------+ 1st Digit                Absent          +----------+---------------+--------+-------+  +---------+---------------+--------+-------+ Left ToesPressure (mmHg)WaveformComment +---------+---------------+--------+-------+ 1st Digit               Normal          +---------+---------------+--------+-------+    Summary: Right: Resting right ankle-brachial index is within normal range. No evidence of significant right lower extremity arterial disease. Left: Resting left ankle-brachial index is within normal range. No evidence of significant left lower extremity arterial disease.  *See table(s) above for measurements and observations.  Electronically signed by Deitra [facility] MD on 03/25/2020 at 4:44:16 PM.    Final       Scheduled Meds: . [MAR Hold] enoxaparin (LOVENOX) injection  40 mg Subcutaneous Q24H  . fentaNYL      . [MAR Hold] insulin aspart  0-15 Units Subcutaneous TID PC & HS   Continuous Infusions: . sodium chloride 100 mL/hr at 03/25/20 1317  . lactated ringers 50 mL/hr at 03/25/20 1837  . [MAR Hold]  piperacillin-tazobactam (ZOSYN)  IV 3.375 g (03/25/20 1318)  . [MAR Hold] vancomycin        Assessment/Plan:  1.  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  Follow-up blood cultures and wound cultures.  Appreciate podiatry input.  Will go to the OR today.  May need vascular studies at some point.  2.  Diabetes mellitus type 2: With nonhealing toe wound after injury and above complications.  Continue sliding scale insulin.  Hold basal coverage as n.p.o. for procedure.  Can resume when cleared for diet  3.?  Hypertension: Patient has had fluctuating blood pressures in the ED in the setting of problem #1.  Although he denies any significant pain, monitor blood pressure trend before adding scheduled antihypertensives.  4.  Tobacco use: Counseled to quit and explained probably contributing to above issues.   DVT prophylaxis: Lovenox Code Status: Full code Family / Patient Communication: Discussed with patient Disposition Plan:   Status is: Inpatient  Remains inpatient appropriate because:IV treatments appropriate due to intensity of illness or inability to take PO   Dispo: The patient is from: Home              Anticipated d/c is to: Home              Anticipated d/c date is: 2 days              Patient currently is not medically stable to d/c.     Time spent: 25 minutes     >50% time spent in discussions with care team and coordination of care.    Guilford Shi, MD [facility] Pager in [place]  If 7PM-7AM, please contact night-coverage [email or web address] 03/25/2020, 7:17 PM

## 2020-03-25 NOTE — Transfer of Care (Signed)
Immediate Anesthesia Transfer of Care Note  Patient: Todd Mccoy  Procedure(s) Performed: AMPUTATION TOE (Right Toe)  Patient Location: PACU  Anesthesia Type:MAC  Level of Consciousness: drowsy and patient cooperative  Airway & Oxygen Therapy: Patient Spontanous Breathing and Patient connected to face mask oxygen  Post-op Assessment: Report given to RN and Post -op Vital signs reviewed and stable  Post vital signs: Reviewed and stable  Last Vitals:  Vitals Value Taken Time  BP 91/59 03/25/20 1833  Temp    Pulse 79 03/25/20 1835  Resp 17 03/25/20 1835  SpO2 100 % 03/25/20 1835  Vitals shown include unvalidated device data.  Last Pain:  Vitals:   03/25/20 1616  TempSrc:   PainSc: 6       Patients Stated Pain Goal: 0 (16/10/96 0454)  Complications: No complications documented.

## 2020-03-25 NOTE — Consult Note (Signed)
Reason for Consult: wet gangrene right 2nd toe Referring Physician: Sim Boast is an 49 y.o. male.  HPI: 49 y.o. male with history of injury to the right 2nd toe. States the wound worsened over the past 1-2 weeks. States the toe turned black. Denies N/V/F/Ch.  Active cigarette smoker.  Past Medical History:  Diagnosis Date  . Diabetes mellitus without complication [facility])     Past Surgical History:  Procedure Laterality Date  . INCISION AND DRAINAGE ABSCESS Right 09/07/2014   Procedure: INCISION AND DRAINAGE ABSCESS RIGHT FLANK;  Surgeon: Jackolyn Confer, MD;  Location: [facility] ORS;  Service: General;  Laterality: Right;  . SCROTUM EXPLORATION      Family History  Problem Relation Age of Onset  . Diabetes Mother   . Diabetes Father   . Diabetes Brother     Social History:  reports that he has been smoking cigarettes. He has been smoking about 0.50 packs per day. He has never used smokeless tobacco. He reports that he does not drink alcohol and does not use drugs.  Allergies: No Known Allergies  Medications: I have reviewed the patient's current medications.  Results for orders placed or performed during the hospital encounter of 03/24/20 (from the past 48 hour(s))  Basic metabolic panel     Status: Abnormal   Collection Time: 03/24/20 10:23 PM  Result Value Ref Range   Sodium 132 (L) 135 - 145 mmol/L   Potassium 3.7 3.5 - 5.1 mmol/L   Chloride 99 98 - 111 mmol/L   CO2 26 22 - 32 mmol/L   Glucose, Bld 216 (H) 70 - 99 mg/dL    Comment: Glucose reference range applies only to samples taken after fasting for at least 8 hours.   BUN 18 6 - 20 mg/dL   Creatinine, Ser 1.27 (H) 0.61 - 1.24 mg/dL   Calcium 7.9 (L) 8.9 - 10.3 mg/dL   GFR calc non Af Amer >60 >60 mL/min   GFR calc Af Amer >60 >60 mL/min   Anion gap 7 5 - 15    Comment: Performed at [facility], [place] [address], [place], [place] [address]  CBC with Differential     Status: Abnormal    Collection Time: 03/24/20 10:23 PM  Result Value Ref Range   WBC 11.5 (H) 4.0 - 10.5 K/uL   RBC 3.28 (L) 4.22 - 5.81 MIL/uL   Hemoglobin 8.7 (L) 13.0 - 17.0 g/dL   HCT 27.4 (L) 39 - 52 %   MCV 83.5 80.0 - 100.0 fL   MCH 26.5 26.0 - 34.0 pg   MCHC 31.8 30.0 - 36.0 g/dL   RDW 13.5 11.5 - 15.5 %   Platelets 311 150 - 400 K/uL   nRBC 0.0 0.0 - 0.2 %   Neutrophils Relative % 71 %   Neutro Abs 8.2 (H) 1.7 - 7.7 K/uL   Lymphocytes Relative 17 %   Lymphs Abs 2.0 0.7 - 4.0 K/uL   Monocytes Relative 10 %   Monocytes Absolute 1.2 (H) 0 - 1 K/uL   Eosinophils Relative 1 %   Eosinophils Absolute 0.1 0 - 0 K/uL   Basophils Relative 0 %   Basophils Absolute 0.0 0 - 0 K/uL   Immature Granulocytes 1 %   Abs Immature Granulocytes 0.06 0.00 - 0.07 K/uL    Comment: Performed at [facility], [place] [address], [place], [place] [address]  Sedimentation rate     Status: Abnormal  Collection Time: 03/24/20 10:23 PM  Result Value Ref Range   Sed Rate 126 (H) 0 - 16 mm/hr    Comment: Performed at [facility], [place] [address] [place], [place] [address]  Culture, blood (routine x 2)     Status: None (Preliminary result)   Collection Time: 03/24/20 10:23 PM   Specimen: BLOOD LEFT HAND  Result Value Ref Range   Specimen Description      BLOOD LEFT HAND Performed at [facility], [place] [address], [place], [place] [address]    Special Requests      BOTTLES DRAWN AEROBIC AND ANAEROBIC Blood Culture adequate volume Performed at [facility] [address] [place], [place] [address]    Culture PENDING    Report Status PENDING   Hemoglobin A1c     Status: Abnormal   Collection Time: 03/24/20 10:23 PM  Result Value Ref Range   Hgb A1c MFr Bld 11.1 (H) 4.8 - 5.6 %    Comment: (NOTE) Pre diabetes:          5.7%-6.4%  Diabetes:              >6.4%  Glycemic control for   <7.0% adults with diabetes    Mean Plasma Glucose 271.87 mg/dL     Comment: Performed at [facility], [place] [address] [place], [place] [address]  SARS Coronavirus 2 by RT PCR (hospital order, performed in [facility] hospital lab) Nasopharyngeal Nasopharyngeal Swab     Status: None   Collection Time: 03/24/20 11:19 PM   Specimen: Nasopharyngeal Swab  Result Value Ref Range   SARS Coronavirus 2 NEGATIVE NEGATIVE    Comment: (NOTE) SARS-CoV-2 target nucleic acids are NOT DETECTED.  The SARS-CoV-2 RNA is generally detectable in upper and lower respiratory specimens during the acute phase of infection. The lowest concentration of SARS-CoV-2 viral copies this assay can detect is 250 copies / mL. A negative result does not preclude SARS-CoV-2 infection and should not be used as the sole basis for treatment or other patient management decisions.  A negative result may occur with improper specimen collection / handling, submission of specimen other than nasopharyngeal swab, presence of viral mutation(s) within the areas targeted by this assay, and inadequate number of viral copies (<250 copies / mL). A negative result must be combined with clinical observations, patient history, and epidemiological information.  Fact Sheet for Patients:   StrictlyIdeas.no  Fact Sheet for Healthcare Providers: BankingDealers.co.za  This test is not yet approved or  cleared by the [place] FDA and has been authorized for detection and/or diagnosis of SARS-CoV-2 by FDA under an Emergency Use Authorization (EUA).  This EUA will remain in effect (meaning this test can be used) for the duration of the COVID-19 declaration under Section 564(b)(1) of the Act, 21 U.S.C. section 360bbb-3(b)(1), unless the authorization is terminated or revoked sooner.  Performed at [facility], [place] [address], [place],  [address]   Basic metabolic panel     Status: Abnormal   Collection Time: 03/25/20  4:36 AM   Result Value Ref Range   Sodium 136 135 - 145 mmol/L   Potassium 3.8 3.5 - 5.1 mmol/L   Chloride 100 98 - 111 mmol/L   CO2 24 22 - 32 mmol/L   Glucose, Bld 223 (H) 70 - 99 mg/dL    Comment: Glucose reference range applies only to samples taken after fasting for at least 8 hours.  BUN 18 6 - 20 mg/dL   Creatinine, Ser 1.20 0.61 - 1.24 mg/dL   Calcium 8.1 (L) 8.9 - 10.3 mg/dL   GFR calc non Af Amer >60 >60 mL/min   GFR calc Af Amer >60 >60 mL/min   Anion gap 12 5 - 15    Comment: Performed at [facility], [place] [address], [place], [place] [address]  CBC     Status: Abnormal   Collection Time: 03/25/20  4:36 AM  Result Value Ref Range   WBC 11.3 (H) 4.0 - 10.5 K/uL   RBC 3.41 (L) 4.22 - 5.81 MIL/uL   Hemoglobin 9.1 (L) 13.0 - 17.0 g/dL   HCT 28.3 (L) 39 - 52 %   MCV 83.0 80.0 - 100.0 fL   MCH 26.7 26.0 - 34.0 pg   MCHC 32.2 30.0 - 36.0 g/dL   RDW 13.5 11.5 - 15.5 %   Platelets 323 150 - 400 K/uL   nRBC 0.0 0.0 - 0.2 %    Comment: Performed at [facility], [place] [address] [place], [place] [address]  CBG monitoring, ED     Status: Abnormal   Collection Time: 03/25/20  7:38 AM  Result Value Ref Range   Glucose-Capillary 215 (H) 70 - 99 mg/dL    Comment: Glucose reference range applies only to samples taken after fasting for at least 8 hours.    DG Foot Complete Right  Result Date: 03/24/2020 CLINICAL DATA:  Right great toe osteomyelitis EXAM: RIGHT FOOT COMPLETE - 3+ VIEW COMPARISON:  None. FINDINGS: Three view radiograph right foot demonstrates near complete destruction of the distal phalanx of the second digit and erosive changes involving the distal aspect of middle phalanx of the second digit in keeping with changes of septic arthritis/osteomyelitis in this location. There is no fracture or dislocation. Remaining joint spaces appear preserved. No additional osseous erosion identified; in particular, no erosive changes identified involving  the great toe. Mild diffuse soft tissue swelling of the right foot. IMPRESSION: Erosive changes involving the distal aspect of the right second digit in keeping with septic arthritis/osteomyelitis in this location with near complete destruction of the distal phalanx and erosion of the middle phalanx. No evidence of osteomyelitis involving the great toe. Diffuse soft tissue swelling.  None Electronically Signed   By: Fidela Salisbury MD   On: 03/24/2020 22:38    ROS Blood pressure (!) 140/93, pulse 97, temperature 98.3 F (36.8 C), temperature source Oral, resp. rate 16, height 5\' 10"  (1.778 m), weight 77.1 kg, SpO2 98 %.  Vitals:   03/25/20 0700 03/25/20 0739  BP: (!) 140/93   Pulse: 97   Resp: 16   Temp:  98.3 F (36.8 C)  SpO2: 98%     General AA&O x3. Normal mood and affect.  Vascular Right foot DP pulse faintly palpable. PT pulse non-palpable.  Neurologic Epicritic sensation grossly absent.  Dermatologic (Wound) Right 2nd toe with wet gangrene, non-viable gangrene of distal digit. Surrounding maceration. Warmth and erythema including edema of the right lower extremity.  Orthopedic: Motor intact BLE.    Assessment/Plan:  Wet Gangrene Right foot -Imaging: Studies independently reviewed -Labs reviewed -Antibiotics: continue empiric therapy -WB Status: WBAT in surgical shoe -Wound Care: Betadine WTD -Surgical Plan: right 2nd toe amputation -Discussed with patient that depending on the extent of PAD he is at risk of further amputation including ray resection or transmetatarsal amputation. Plan today for amputation of right 2nd toe for control of  infection. May need further amputation. Will order vascular studies to eval for possible vascular intervention.  Evelina Bucy 03/25/2020, 8:55 AM   Best available via secure chat for questions or concerns.

## 2020-03-25 NOTE — Op Note (Signed)
  Patient Name: Todd Mccoy DOB: 03-26-1971  MRN: [id number]   Date of Surgery: 03/25/20  Surgeon: Dr. Hardie Pulley, DPM Assistants: None  Pre-operative Diagnosis:  * No Diagnosis Codes entered * Post-operative Diagnosis:  * No Diagnosis Codes entered * Procedures:  1) Right 2nd toe amputation Pathology/Specimens: ID Type Source Tests Collected by Time Destination  1 : Right 2nd toe Amputation Toe, Right SURGICAL PATHOLOGY Evelina Bucy, DPM 03/25/2020 1813    Anesthesia: MAC/local Hemostasis: * No tourniquets in log * Estimated Blood Loss: 5 mL Materials: * No implants in log * Medications: 1g Vancomycin topical Complications: none  Indications for Procedure:  This is a 49 y.o. male with wet gangrene of the right 2nd toe. It was discussed that given the non-viable digit he would benefit from amputation of the toe. All risks, benefits, and alternatives of surgery were discussed. It was discussed that given likely poor perfusion he may end up with more proximal amputation, however we are proceeding today for control of infection.   Procedure in Detail: Patient was identified in pre-operative holding area. Formal consent was signed and the right lower extremity was marked. Patient was brought back to the operating room. Anesthesia was induced. The extremity was prepped and draped in the usual sterile fashion. Timeout was taken to confirm patient name, laterality, and procedure prior to incision.   Attention was then directed to the   Attention was then directed to the right 2nd toe where a necrotic macerated gangrenous toe was noted. The distal aspect of the digit had purulence. An incision was made overlying the PIPJ proximal to the gangrenous tissue. Dissection was carried down to level of bone.  Dissection was continued to the metatarsophalangeal joint and all collateral ligaments were freed at the joint.  The bone soft tissue attachments of the proximal phalanx were  removed and passed for pathology.  The remaining metatarsal head appeared healthy and viable.  The tissues did have bleeding and were not avascular, however this was less than would be expected for fully vascularized tissue. The area was copiously irrigated.  The skin was loosely reapproximated with nylon.  The foot was then dressed with 4x4, kerlix, ACE bandage. Patient tolerated the procedure well.   Disposition: Following a period of post-operative monitoring, patient will be transferred to the floor. I anticipate that based upon the tissue perfusion of the area this will prove hard to heal. He will likely need RTOR at later date for further amputation or full closure.

## 2020-03-25 NOTE — Progress Notes (Signed)
Pharmacy Antibiotic Note  Todd Mccoy is a 49 y.o. male admitted on 03/24/2020 with cellulitis/osteomyelitis.  Pharmacy has been consulted for vancomycin and Zosyn  dosing.  Plan: Zosyn 3.375g IV q8h (4 hour infusion).  Vancomycin 1500 mg IV x1, then 750 mg IV q12h  Monitor clinical course, renal function, cultures as available   Height: 5\' 10"  (177.8 cm) Weight: 77.1 kg (170 lb) IBW/kg (Calculated) : 73  Temp (24hrs), Avg:98.2 F (36.8 C), Min:98.2 F (36.8 C), Max:98.2 F (36.8 C)  Recent Labs  Lab 03/19/20 0505 03/24/20 2223  WBC 10.5 11.5*  CREATININE 1.39* 1.27*    Estimated Creatinine Clearance: 74.2 mL/min (A) (by C-G formula based on SCr of 1.27 mg/dL (H)).    No Known Allergies  Antimicrobials this admission: 9/20 Zosyn >>  9/20 vancomycin >>   Dose adjustments this admission:   Microbiology results: 9/21 BCx:   Thank you for allowing pharmacy to be a part of this patient's care.    Royetta Asal, PharmD, BCPS 03/25/2020 12:42 AM

## 2020-03-25 NOTE — Anesthesia Preprocedure Evaluation (Signed)
Anesthesia Evaluation  Patient identified by MRN, date of birth, ID band Patient awake    Reviewed: Allergy & Precautions, NPO status , Patient's Chart, lab work & pertinent test results  Airway Mallampati: II  TM Distance: >3 FB Neck ROM: Full    Dental no notable dental hx.    Pulmonary Current Smoker,    Pulmonary exam normal breath sounds clear to auscultation       Cardiovascular negative cardio ROS Normal cardiovascular exam Rhythm:Regular Rate:Normal     Neuro/Psych negative neurological ROS  negative psych ROS   GI/Hepatic negative GI ROS, Neg liver ROS,   Endo/Other  diabetes, Type 2  Renal/GU Renal InsufficiencyRenal disease  negative genitourinary   Musculoskeletal negative musculoskeletal ROS (+)   Abdominal   Peds negative pediatric ROS (+)  Hematology  (+) anemia ,   Anesthesia Other Findings   Reproductive/Obstetrics negative OB ROS                             Anesthesia Physical Anesthesia Plan  ASA: III  Anesthesia Plan: MAC   Post-op Pain Management:    Induction: Intravenous  PONV Risk Score and Plan: 0 and Propofol infusion  Airway Management Planned: Simple Face Mask  Additional Equipment:   Intra-op Plan:   Post-operative Plan:   Informed Consent: I have reviewed the patients History and Physical, chart, labs and discussed the procedure including the risks, benefits and alternatives for the proposed anesthesia with the patient or authorized representative who has indicated his/her understanding and acceptance.     Dental advisory given  Plan Discussed with: CRNA and Surgeon  Anesthesia Plan Comments:         Anesthesia Quick Evaluation

## 2020-03-25 NOTE — Progress Notes (Signed)
Pharmacy Antibiotic Note  Todd Mccoy is a 49 y.o. male with DM presented to he ED on 03/24/2020 with c/o right second toe swelling, discoloration and pain. Right foot xray showed findings consistent with "septic arthritis/osteomyelitis" of the right second digit.  He's currently on vancomycin and zosyn for infection.  Today, 03/25/2020: - Day #1 abx - afeb, wbc 11.5 - scr 1.20 (crcl~78)  Plan: - continue Zosyn 3.375 gm IV q8h (infuse over 4 hours) - adjust vancomycin dose to 1000 mg IV q12h - daily scr  _____________________________________________  Height: 5\' 10"  (177.8 cm) Weight: 77.1 kg (170 lb) IBW/kg (Calculated) : 73  Temp (24hrs), Avg:98.5 F (36.9 C), Min:98.2 F (36.8 C), Max:98.8 F (37.1 C)  Recent Labs  Lab 03/19/20 0505 03/24/20 2223 03/25/20 0436  WBC 10.5 11.5* 11.3*  CREATININE 1.39* 1.27* 1.20    Estimated Creatinine Clearance: 78.6 mL/min (by C-G formula based on SCr of 1.2 mg/dL).    No Known Allergies  Thank you for allowing pharmacy to be a part of this patient's care.  Lynelle Doctor 03/25/2020 9:42 AM

## 2020-03-25 NOTE — Anesthesia Procedure Notes (Signed)
Anesthesia Regional Block: Ankle block   Pre-Anesthetic Checklist: ,, timeout performed, Correct Patient, Correct Site, Correct Laterality, Correct Procedure, Correct Position, site marked, Risks and benefits discussed,  Surgical consent,  Pre-op evaluation,  At surgeon's request and post-op pain management  Laterality: Right  Prep: chloraprep       Needles:  Injection technique: Single-shot  Needle Type: Other     Needle Length: 5cm  Needle Gauge: 25     Additional Needles:   Narrative:  Start time: 03/25/2020 4:45 PM End time: 03/25/2020 4:48 PM Injection made incrementally with aspirations every 5 mL.  Performed by: Personally  Anesthesiologist: Myrtie Soman, MD  Additional Notes: Patient tolerated the procedure well without complications

## 2020-03-25 NOTE — Progress Notes (Signed)
Pharmacy Antibiotic Note  Todd Mccoy is a 49 y.o. male with DM presented to he ED on 03/24/2020 with c/o right second toe swelling, discoloration and pain. Right foot xray showed findings consistent with "septic arthritis/osteomyelitis" of the right second digit.  He's currently on vancomycin and zosyn for infection, now s/p toe amputation.  Today, 03/25/2020: - Day #1 abx - afeb, wbc 11.5 - scr 1.20 (crcl~78)  Plan: - Change zosyn to cefepime 2g IV q8h - Vancomycin as previously ordered _____________________________________________  Height: 5\' 10"  (177.8 cm) Weight: 79.4 kg (175 lb) IBW/kg (Calculated) : 73  Temp (24hrs), Avg:98.6 F (37 C), Min:98.2 F (36.8 C), Max:99.1 F (37.3 C)  Recent Labs  Lab 03/19/20 0505 03/24/20 2223 03/25/20 0436  WBC 10.5 11.5* 11.3*  CREATININE 1.39* 1.27* 1.20    Estimated Creatinine Clearance: 78.6 mL/min (by C-G formula based on SCr of 1.2 mg/dL).    No Known Allergies  Thank you for allowing pharmacy to be a part of this patient's care.  Peggyann Juba, PharmD, BCPS Pharmacy: [phone] 03/25/2020 7:39 PM

## 2020-03-26 ENCOUNTER — Encounter (HOSPITAL_COMMUNITY): Payer: Self-pay | Admitting: Podiatry

## 2020-03-26 LAB — BASIC METABOLIC PANEL
Anion gap: 10 (ref 5–15)
BUN: 17 mg/dL (ref 6–20)
CO2: 21 mmol/L — ABNORMAL LOW (ref 22–32)
Calcium: 7.7 mg/dL — ABNORMAL LOW (ref 8.9–10.3)
Chloride: 103 mmol/L (ref 98–111)
Creatinine, Ser: 1.25 mg/dL — ABNORMAL HIGH (ref 0.61–1.24)
GFR calc Af Amer: >60 mL/min (ref >60)
GFR calc non Af Amer: >60 mL/min (ref >60)
Glucose, Bld: 171 mg/dL — ABNORMAL HIGH (ref 70–99)
Potassium: 4.2 mmol/L (ref 3.5–5.1)
Sodium: 134 mmol/L — ABNORMAL LOW (ref 135–145)

## 2020-03-26 LAB — CBC
HCT: 32.3 % — ABNORMAL LOW (ref 39.0–52.0)
Hemoglobin: 10 g/dL — ABNORMAL LOW (ref 13.0–17.0)
MCH: 26.7 pg (ref 26.0–34.0)
MCHC: 31 g/dL (ref 30.0–36.0)
MCV: 86.1 fL (ref 80.0–100.0)
Platelets: 377 10*3/uL (ref 150–400)
RBC: 3.75 MIL/uL — ABNORMAL LOW (ref 4.22–5.81)
RDW: 13.8 % (ref 11.5–15.5)
WBC: 15.5 10*3/uL — ABNORMAL HIGH (ref 4.0–10.5)
nRBC: 0 % (ref 0.0–0.2)

## 2020-03-26 LAB — GLUCOSE, CAPILLARY
Glucose-Capillary: 160 mg/dL — ABNORMAL HIGH (ref 70–99)
Glucose-Capillary: 231 mg/dL — ABNORMAL HIGH (ref 70–99)
Glucose-Capillary: 202 mg/dL — ABNORMAL HIGH (ref 70–99)
Glucose-Capillary: 209 mg/dL — ABNORMAL HIGH (ref 70–99)

## 2020-03-26 MED ORDER — INSULIN ASPART PROT & ASPART (70-30 MIX) 100 UNIT/ML ~~LOC~~ SUSP
20.0000 [IU] | Freq: Two times a day (BID) | SUBCUTANEOUS | Status: DC
  Administered 2020-03-27: 20 [IU] via SUBCUTANEOUS
  Filled 2020-03-26: qty 10

## 2020-03-26 MED ORDER — INSULIN ASPART 100 UNIT/ML ~~LOC~~ SOLN
0.0000 [IU] | Freq: Every day | SUBCUTANEOUS | Status: DC
  Administered 2020-03-26: 2 [IU] via SUBCUTANEOUS

## 2020-03-26 MED ORDER — INSULIN ASPART 100 UNIT/ML ~~LOC~~ SOLN
0.0000 [IU] | Freq: Three times a day (TID) | SUBCUTANEOUS | Status: DC
  Administered 2020-03-27: 3 [IU] via SUBCUTANEOUS
  Administered 2020-03-27: 2 [IU] via SUBCUTANEOUS

## 2020-03-26 NOTE — Progress Notes (Signed)
PROGRESS NOTE   Todd Mccoy  SFK:[id number]    DOB: 08-03-1970    DOA: 03/24/2020  PCP: Patient, No Pcp Per   I have briefly reviewed patients previous medical [facility] in [facility].  Chief Complaint  Patient presents with  . Wound Infection    Brief Narrative:  49 year old male with PMH of IDDM, presented to ED with acute onset of worsening right second toe swelling with blackish discoloration, erythema, warmth, pain ongoing for 2-1/2 weeks PTA.  Admitted for right second toe wet gangrene with osteomyelitis.  S/p right second toe amputation by podiatry on 9/21.  Improving.   Assessment & Plan:  Active Problems:   Toe osteomyelitis ([place])   Right second toe wet gangrene with osteomyelitis: S/p right second toe amputation by podiatry on 9/21.  Podiatry follow-up appreciated.  Dressing changed, wound appeared to be perfused with no plans for vascular intervention at this time but may need at a later date, WBAT in surgical shoe, has outpatient follow-up with Dr. March Rummage next Tuesday, continue IV antibiotics for today with recommendations for DC tomorrow on Augmentin x7 days.  Currently on cefepime and vancomycin.  Bilateral ABIs within normal range.  Blood culture x1: Negative to date.  Type II DM/IDDM with hyperglycemia: Mildly uncontrolled.  Resume some of his home dose of 70/30 insulin.  Continue SSI.  Anemia of chronic disease: Stable.  Outpatient follow-up.   Stage II chronic kidney disease:  Creatinine seems stable in the 1.2 range.  Possible hypertension: Blood pressures intermittently elevated and fluctuating.  He may have underlying hypertension and pain may be contributing.  As needed hydralazine for now but may need maintenance antihypertensives at some point, possibly during outpatient follow-up.  Tobacco abuse: Cessation counseled.  Body mass index is 25.11 kg/m.   DVT prophylaxis: enoxaparin (LOVENOX) injection 40 mg Start: 03/25/20 1000     Code  Status: Full Code  Family Communication: None at bedside Disposition:  Status is: Inpatient  Remains inpatient appropriate because:Inpatient level of care appropriate due to severity of illness   Dispo:  Patient From: Home  Planned Disposition: Home  Expected discharge date: 03/28/20  Medically stable for discharge: No         Consultants:     Procedures:     Antimicrobials:    Anti-infectives (From admission, onward)   Start     Dose/Rate Route Frequency Ordered Stop   03/25/20 2200  vancomycin (VANCOCIN) IVPB 1000 mg/200 mL premix        1,000 mg 200 mL/hr over 60 Minutes Intravenous Every 12 hours 03/25/20 0949     03/25/20 2000  ceFEPIme (MAXIPIME) 2 g in sodium chloride 0.9 % 100 mL IVPB        2 g 200 mL/hr over 30 Minutes Intravenous Every 8 hours 03/25/20 1941     03/25/20 1819  vancomycin (VANCOCIN) powder  Status:  Discontinued          As needed 03/25/20 1819 03/25/20 1922   03/25/20 1000  vancomycin (VANCOREADY) IVPB 750 mg/150 mL  Status:  Discontinued        750 mg 150 mL/hr over 60 Minutes Intravenous Every 12 hours 03/25/20 0040 03/25/20 0949   03/25/20 0600  piperacillin-tazobactam (ZOSYN) IVPB 3.375 g  Status:  Discontinued        3.375 g 12.5 mL/hr over 240 Minutes Intravenous Every 8 hours 03/25/20 0040 03/25/20 1928   03/24/20 2330  piperacillin-tazobactam (ZOSYN) IVPB 3.375 g  Status:  Discontinued  3.375 g 100 mL/hr over 30 Minutes Intravenous  Once 03/24/20 2321 03/25/20 0014   03/24/20 2330  vancomycin (VANCOCIN) IVPB 1000 mg/200 mL premix  Status:  Discontinued        1,000 mg 200 mL/hr over 60 Minutes Intravenous  Once 03/24/20 2321 03/25/20 0017   03/24/20 2315  piperacillin-tazobactam (ZOSYN) IVPB 3.375 g        3.375 g 100 mL/hr over 30 Minutes Intravenous  Once 03/24/20 2302 03/25/20 0040   03/24/20 2315  vancomycin (VANCOREADY) IVPB 1500 mg/300 mL        1,500 mg 150 mL/hr over 120 Minutes Intravenous  Once 03/24/20 2308  03/25/20 0154        Subjective:  Overall feels better.  Mild postop foot pain.  No other complaints reported.  Objective:   Vitals:   03/26/20 0041 03/26/20 0143 03/26/20 0400 03/26/20 1350  BP: (!) 169/98 139/89 (!) 152/101 (!) 140/91  Pulse: (!) 114 (!) 109 (!) 108 99  Resp: 17 18 18 17   Temp: 100 F (37.8 C) 99.8 F (37.7 C) 100.2 F (37.9 C) 99 F (37.2 C)  TempSrc: Oral Oral Oral Oral  SpO2: 96% 96% 97% 100%  Weight:      Height:        General exam: Does not young male, moderately built and nourished lying comfortably propped up in bed. Respiratory system: Clear to auscultation. Respiratory effort normal. Cardiovascular system: S1 & S2 heard, RRR. No JVD, murmurs, rubs, gallops or clicks. No pedal edema. Gastrointestinal system: Abdomen is nondistended, soft and nontender. No organomegaly or masses felt. Normal bowel sounds heard. Central nervous system: Alert and oriented. No focal neurological deficits. Extremities: Symmetric 5 x 5 power. Skin: No rashes, lesions or ulcers.  Right foot postop dressing clean and dry.  Able to move foot without difficulty. Psychiatry: Judgement and insight appear normal. Mood & affect appropriate.     Data Reviewed:   I have personally reviewed following labs and imaging studies   CBC: Recent Labs  Lab 03/24/20 2223 03/25/20 0436 03/26/20 0835  WBC 11.5* 11.3* 15.5*  NEUTROABS 8.2*  --   --   HGB 8.7* 9.1* 10.0*  HCT 27.4* 28.3* 32.3*  MCV 83.5 83.0 86.1  PLT 311 323 315    Basic Metabolic Panel: Recent Labs  Lab 03/24/20 2223 03/25/20 0436 03/26/20 0835  NA 132* 136 134*  K 3.7 3.8 4.2  CL 99 100 103  CO2 26 24 21*  GLUCOSE 216* 223* 171*  BUN 18 18 17   CREATININE 1.27* 1.20 1.25*  CALCIUM 7.9* 8.1* 7.7*    Liver Function Tests: No results for input(s): AST, ALT, ALKPHOS, BILITOT, PROT, ALBUMIN in the last 168 hours.  CBG: Recent Labs  Lab 03/26/20 0741 03/26/20 1150 03/26/20 1632  GLUCAP 160*  231* 202*    Microbiology Studies:   Recent Results (from the past 240 hour(s))  Culture, blood (routine x 2)     Status: None (Preliminary result)   Collection Time: 03/24/20 10:23 PM   Specimen: BLOOD LEFT HAND  Result Value Ref Range Status   Specimen Description   Final    BLOOD LEFT HAND Performed at [facility], [place] [address] [place], [place] [address]    Special Requests   Final    BOTTLES DRAWN AEROBIC AND ANAEROBIC Blood Culture adequate volume Performed at [facility] [address], [place], [place] [address]    Culture   Final  NO GROWTH 1 DAY Performed at [facility], [place] [address] [place], [place] [address]    Report Status PENDING  Incomplete  SARS Coronavirus 2 by RT PCR (hospital order, performed in [facility] hospital lab) Nasopharyngeal Nasopharyngeal Swab     Status: None   Collection Time: 03/24/20 11:19 PM   Specimen: Nasopharyngeal Swab  Result Value Ref Range Status   SARS Coronavirus 2 NEGATIVE NEGATIVE Final    Comment: (NOTE) SARS-CoV-2 target nucleic acids are NOT DETECTED.  The SARS-CoV-2 RNA is generally detectable in upper and lower respiratory specimens during the acute phase of infection. The lowest concentration of SARS-CoV-2 viral copies this assay can detect is 250 copies / mL. A negative result does not preclude SARS-CoV-2 infection and should not be used as the sole basis for treatment or other patient management decisions.  A negative result may occur with improper specimen collection / handling, submission of specimen other than nasopharyngeal swab, presence of viral mutation(s) within the areas targeted by this assay, and inadequate number of viral copies (<250 copies / mL). A negative result must be combined with clinical observations, patient history, and epidemiological information.  Fact Sheet for Patients:   StrictlyIdeas.no  Fact Sheet for Healthcare  Providers: BankingDealers.co.za  This test is not yet approved or  cleared by the [place] FDA and has been authorized for detection and/or diagnosis of SARS-CoV-2 by FDA under an Emergency Use Authorization (EUA).  This EUA will remain in effect (meaning this test can be used) for the duration of the COVID-19 declaration under Section 564(b)(1) of the Act, 21 U.S.C. section 360bbb-3(b)(1), unless the authorization is terminated or revoked sooner.  Performed at [facility], [place] [address] [place], [place] [address]   MRSA PCR Screening     Status: None   Collection Time: 03/25/20  9:41 PM   Specimen: Nasopharyngeal  Result Value Ref Range Status   MRSA by PCR NEGATIVE NEGATIVE Final    Comment:        The GeneXpert MRSA Assay (FDA approved for NASAL specimens only), is one component of a comprehensive MRSA colonization surveillance program. It is not intended to diagnose MRSA infection nor to guide or monitor treatment for MRSA infections. Performed at [facility], [place] [address], [place], [place] [address]      Radiology Studies:  DG Foot Complete Right  Result Date: 03/24/2020 CLINICAL DATA:  Right great toe osteomyelitis EXAM: RIGHT FOOT COMPLETE - 3+ VIEW COMPARISON:  None. FINDINGS: Three view radiograph right foot demonstrates near complete destruction of the distal phalanx of the second digit and erosive changes involving the distal aspect of middle phalanx of the second digit in keeping with changes of septic arthritis/osteomyelitis in this location. There is no fracture or dislocation. Remaining joint spaces appear preserved. No additional osseous erosion identified; in particular, no erosive changes identified involving the great toe. Mild diffuse soft tissue swelling of the right foot. IMPRESSION: Erosive changes involving the distal aspect of the right second digit in keeping with septic  arthritis/osteomyelitis in this location with near complete destruction of the distal phalanx and erosion of the middle phalanx. No evidence of osteomyelitis involving the great toe. Diffuse soft tissue swelling.  None Electronically Signed   By: Fidela Salisbury MD   On: 03/24/2020 22:38   VAS [place] ABI WITH/WO TBI  Result Date: 03/25/2020 LOWER EXTREMITY DOPPLER STUDY Indications: Gangrene. High Risk Factors: Diabetes. Other Factors: Gangrene second toe on Right  foot.  Performing Technologist: Griffin Basil RCT RDMS  Examination Guidelines: A complete evaluation includes at minimum, Doppler waveform signals and systolic blood pressure reading at the level of bilateral brachial, anterior tibial, and posterior tibial arteries, when vessel segments are accessible. Bilateral testing is considered an integral part of a complete examination. Photoelectric Plethysmograph (PPG) waveforms and toe systolic pressure readings are included as required and additional duplex testing as needed. Limited examinations for reoccurring indications may be performed as noted.  ABI Findings: +---------+------------------+-----+---------+--------+ Right    Rt Pressure (mmHg)IndexWaveform Comment  +---------+------------------+-----+---------+--------+ Brachial 162                    triphasic         +---------+------------------+-----+---------+--------+ PTA      173               1.07 triphasic         +---------+------------------+-----+---------+--------+ DP       163               1.01 triphasic         +---------+------------------+-----+---------+--------+ Great Toe                       Absent            +---------+------------------+-----+---------+--------+ +---------+------------------+-----+---------+-------+ Left     Lt Pressure (mmHg)IndexWaveform Comment +---------+------------------+-----+---------+-------+ Brachial 160                    triphasic         +---------+------------------+-----+---------+-------+ PTA      173               1.07 triphasic        +---------+------------------+-----+---------+-------+ DP       170               1.05 triphasic        +---------+------------------+-----+---------+-------+ Great Toe                       Normal           +---------+------------------+-----+---------+-------+ +-------+-----------+-----------+------------+------------+ ABI/TBIToday's ABIToday's TBIPrevious ABIPrevious TBI +-------+-----------+-----------+------------+------------+ Right  1.07                                           +-------+-----------+-----------+------------+------------+ Left   1.07                                           +-------+-----------+-----------+------------+------------+ TOES Findings: +----------+---------------+--------+-------+ Right ToesPressure (mmHg)WaveformComment +----------+---------------+--------+-------+ 1st Digit                Absent          +----------+---------------+--------+-------+  +---------+---------------+--------+-------+ Left ToesPressure (mmHg)WaveformComment +---------+---------------+--------+-------+ 1st Digit               Normal          +---------+---------------+--------+-------+    Summary: Right: Resting right ankle-brachial index is within normal range. No evidence of significant right lower extremity arterial disease. Left: Resting left ankle-brachial index is within normal range. No evidence of significant left lower extremity arterial disease.  *See table(s) above for measurements and observations.  Electronically signed by Deitra Mayo MD on 03/25/2020 at 4:44:16 PM.  Final      Scheduled Meds:   . enoxaparin (LOVENOX) injection  40 mg Subcutaneous Q24H  . insulin aspart  0-15 Units Subcutaneous TID PC & HS    Continuous Infusions:   . sodium chloride 100 mL/hr at 03/25/20 2000  . ceFEPime (MAXIPIME) IV 2 g  (03/26/20 1132)  . vancomycin 1,000 mg (03/26/20 0934)     LOS: 2 days     Vernell Leep, MD, [place], [facility]. [facility]    To contact the attending provider between 7A-7P or the covering provider during after hours 7P-7A, please log into the web site [email or web address] and access using universal [place] password for that web site. If you do not have the password, please call the hospital operator.  03/26/2020, 7:04 PM

## 2020-03-26 NOTE — Progress Notes (Signed)
  Subjective:  Patient ID: Todd Mccoy, male    DOB: 1970/08/30,  MRN: [id number]  Seen bedside. Only having a little pain to the foot, controlled. Objective:   Vitals:   03/26/20 0143 03/26/20 0400  BP: 139/89 (!) 152/101  Pulse: (!) 109 (!) 108  Resp: 18 18  Temp: 99.8 F (37.7 C) 100.2 F (37.9 C)  SpO2: 96% 97%   General AA&O x3. Normal mood and affect.  Vascular Foot warm to touch. Decreased edema.  Neurologic Epicritic sensation grossly diminished.  Dermatologic Wound edges warm, no evidence of ischemia at this point. +SS drainage. Decreased erythema.  Orthopedic: +Motor distally    Assessment & Plan:  Patient was evaluated and treated and all questions answered.  S/p right 2nd toe amputation -Dressed with DSD today -HHC if possible for Betadine WTD 3x weekly.  -The wound appears to be perfused, no plan for vascular intervention at this time but may need at later date -WBAT in surgical shoe. -Please have patietn f/u in office next Tuesday.  Continue IV abx today. [place] for d/c tomorrow AM. D/c with Augmentin x7 days  Evelina Bucy, DPM  Best accessible via secure chat for questions or concerns.

## 2020-03-26 NOTE — Progress Notes (Signed)
   03/26/20 0143  Assess: MEWS Score  Temp 99.8 F (37.7 C)  BP 139/89  Pulse Rate (!) 109  Resp 18  SpO2 96 %  O2 Device Room Air  Assess: MEWS Score  MEWS Temp 0  MEWS Systolic 0  MEWS Pulse 1  MEWS RR 0  MEWS LOC 0  MEWS Score 1  MEWS Score Color Green  Treat  Pain Scale 0-10  Pain Score 2  Nausea relieved by Antiemetic  Patients response to intervention Effective

## 2020-03-26 NOTE — Progress Notes (Signed)
   03/26/20 0041  Assess: MEWS Score  Temp 100 F (37.8 C)  BP (!) 169/98  Pulse Rate (!) 114  Resp 17  SpO2 96 %  O2 Device Room Air  Assess: MEWS Score  MEWS Temp 0  MEWS Systolic 0  MEWS Pulse 2  MEWS RR 0  MEWS LOC 0  MEWS Score 2  MEWS Score Color Yellow  Assess: if the MEWS score is Yellow or Red  Focused Assessment No change from prior assessment  Early Detection of Sepsis Score *See Row Information* Low  MEWS guidelines implemented *See Row Information* No, other (Comment) (VS rechecked after PRNs given and WNL)  Treat  MEWS Interventions Administered prn meds/treatments (Zofran/Morphine)  Pain Scale 0-10  Pain Score 7  Pain Type Surgical pain  Pain Location Foot  Pain Orientation Right  Pain Intervention(s) Medication (See eMAR)  Multiple Pain Sites No  Complains of Nausea /  Vomiting (Pt vomiting upon assessment Zofran @ 0052)  Nausea relieved by Antiemetic  Notify: Charge Nurse/RN  Name of Charge Nurse/RN Notified [place], RN  Date Charge Nurse/RN Notified 03/26/20  Time Charge Nurse/RN Notified 0100 (made aware of situation)

## 2020-03-27 LAB — BASIC METABOLIC PANEL
Anion gap: 8 (ref 5–15)
BUN: 16 mg/dL (ref 6–20)
CO2: 24 mmol/L (ref 22–32)
Calcium: 7.8 mg/dL — ABNORMAL LOW (ref 8.9–10.3)
Chloride: 100 mmol/L (ref 98–111)
Creatinine, Ser: 1.14 mg/dL (ref 0.61–1.24)
GFR calc Af Amer: >60 mL/min (ref >60)
GFR calc non Af Amer: >60 mL/min (ref >60)
Glucose, Bld: 149 mg/dL — ABNORMAL HIGH (ref 70–99)
Potassium: 3.8 mmol/L (ref 3.5–5.1)
Sodium: 132 mmol/L — ABNORMAL LOW (ref 135–145)

## 2020-03-27 LAB — CBC
HCT: 28.7 % — ABNORMAL LOW (ref 39.0–52.0)
Hemoglobin: 9 g/dL — ABNORMAL LOW (ref 13.0–17.0)
MCH: 26.4 pg (ref 26.0–34.0)
MCHC: 31.4 g/dL (ref 30.0–36.0)
MCV: 84.2 fL (ref 80.0–100.0)
Platelets: 363 10*3/uL (ref 150–400)
RBC: 3.41 MIL/uL — ABNORMAL LOW (ref 4.22–5.81)
RDW: 13.7 % (ref 11.5–15.5)
WBC: 12.2 10*3/uL — ABNORMAL HIGH (ref 4.0–10.5)
nRBC: 0 % (ref 0.0–0.2)

## 2020-03-27 LAB — GLUCOSE, CAPILLARY
Glucose-Capillary: 141 mg/dL — ABNORMAL HIGH (ref 70–99)
Glucose-Capillary: 158 mg/dL — ABNORMAL HIGH (ref 70–99)

## 2020-03-27 LAB — SURGICAL PATHOLOGY

## 2020-03-27 MED ORDER — OXYCODONE HCL 5 MG PO TABS: 5.0000 mg | ORAL_TABLET | Freq: Three times a day (TID) | ORAL | 0 refills | Status: DC | PRN

## 2020-03-27 MED ORDER — AMOXICILLIN-POT CLAVULANATE 875-125 MG PO TABS: 1.0000 | ORAL_TABLET | Freq: Two times a day (BID) | ORAL | 0 refills | Status: AC

## 2020-03-27 MED ORDER — ACETAMINOPHEN 325 MG PO TABS: 650.0000 mg | ORAL_TABLET | Freq: Four times a day (QID) | ORAL | Status: DC | PRN

## 2020-03-27 MED FILL — AMOX-CLAV 875-125 MG TABLET: 875-125 | 7 days supply | Qty: 14 | Fill #0

## 2020-03-27 NOTE — Progress Notes (Signed)
Discharge instructions provided to and reviewed with pt.  PIV discontinued.  Catheter intact and clotting within expected timeframe.  Pt transported to front lobby via wheelchair by [facility] staff.

## 2020-03-27 NOTE — TOC Transition Note (Signed)
Transition of Care (TOC) - CM/SW Discharge Note   Patient Details  Name: Todd Mccoy MRN: [id number] Date of Birth: 08/24/1970  Transition of Care (TOC) CM/SW Contact:  CLEMENTS, NORA H, RN Phone Number: 03/27/2020, 11:33 AM   Clinical Narrative:     Pt to dc home today. This CM met with pt at bedside for dc planning. RN to do teaching with pt for his dressing changes at home and to give him dressing supplies. RN to also obtain surgical shoe for pt to take home with him. This CM made pt a PCP appointment and placed it on his AVS. He states that he will go and he will get his dc medications at [facility].         Discharge Plan and Services                                     Social Determinants of Health (SDOH) Interventions     Readmission Risk Interventions No flowsheet data found.     

## 2020-03-27 NOTE — Discharge Summary (Signed)
Physician Discharge Summary  Todd Mccoy MHD:[id number] DOB: 1971/04/13  PCP: Patient, No Pcp Per  Admitted from: Home Discharged to: Home  Admit date: 03/24/2020 Discharge date: 03/27/2020  Recommendations for Outpatient Follow-up:    Follow-up Information    Evelina Bucy, DPM Follow up on 04/01/2020.   Specialty: Podiatry Why: Please call office for appointment time. Contact information: [address] [address] [place] [facility] Follow up on 04/17/2020.   Why: Appointment at 3:10pm to establish primary care provider. To be seen with repeat labs (CBC & BMP). Contact information: [place] [phone] [place]: None Equipment/Devices: Surgical shoe.  Patient has been educated by RN regarding self-management of postop site dressing changes.  Discharge Condition: Improved and stable. CODE STATUS: Full Diet recommendation: Heart healthy & diabetic diet.  Discharge Diagnoses:  Active Problems:   Toe osteomyelitis [facility]   Brief Summary: 49 year old male with PMH of IDDM, presented to ED with acute onset of worsening right second toe swelling with blackish discoloration, erythema, warmth, pain ongoing for 2-1/2 weeks PTA.  Admitted for right second toe wet gangrene with osteomyelitis.  S/p right second toe amputation by podiatry on 9/21.  Improving.   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  Podiatry follow-up 9/22 appreciated.  Dressing changed, wound appeared to be perfused with no plans for vascular intervention at this time but may need at a later date, WBAT in surgical shoe, has outpatient follow-up with Dr. March Rummage next Tuesday, continued IV antibiotics for yesterday with recommendations for DC today on Augmentin x7 days. Was on cefepime and  vancomycin.  Bilateral ABIs within normal range.  Blood culture x1: Negative to date.  Patient doing well.  Mild pain.  Has used maybe 2 doses per day of IV morphine.  Reviewed PDMP and has not utilized opioid pain medications recently.  Patient states that he has used and tolerated Percocet and Vicodin in the past.  Short supply of OxyIR ordered as below with detailed counseling regarding benefits, risks, side effects and precautionary measures.  He verbalized understanding.  Type II DM/IDDM with hyperglycemia: A1c 11.1 on 9/20 suggests very poor outpatient control.  Suspect poor compliance with multiple aspects including diet, insulin.  States that he does not have a PCP and has not seen one in a long time but just picks up prescriptions from the pharmacy.  TOC to arrange new PCP and patient advise close follow-up for further management and adjustment of medications.  He verbalized understanding.  Anemia of chronic disease: Stable.  Outpatient follow-up.   Stage II chronic kidney disease:  Creatinine down to 1.14 on day of discharge.  Possible hypertension: Blood pressures intermittently elevated and fluctuating.  He may have underlying hypertension and pain may be contributing.  Close outpatient follow-up and may consider starting maintenance antihypertensives i.e. ACEI or ARB given diabetes history.  Tobacco abuse: Cessation counseled.  Body mass index is 25.11 kg/m.     Consultations:  Podiatry  Procedures:  Right second toe amputation on 9/21.   Discharge Instructions  Discharge Instructions    Call MD for:  difficulty breathing, headache or visual disturbances   Complete by: As directed    Call MD for:  extreme fatigue  Complete by: As directed    Call MD for:  persistant dizziness or light-headedness   Complete by: As directed    Call MD for:  persistant nausea and vomiting   Complete by: As directed    Call MD for:  redness, tenderness, or signs of  infection (pain, swelling, redness, odor or green/yellow discharge around incision site)   Complete by: As directed    Call MD for:  severe uncontrolled pain   Complete by: As directed    Call MD for:  temperature >100.4   Complete by: As directed    Diet - low sodium heart healthy   Complete by: As directed    Diet Carb Modified   Complete by: As directed    Discharge wound care:   Complete by: As directed    Betadine wet-to-dry dressing change 3 times a week to postoperative surgical site on right foot.   Increase activity slowly   Complete by: As directed    Weightbearing as tolerated on right foot with surgical shoe.       Medication List    TAKE these medications   acetaminophen 325 MG tablet Commonly known as: Tylenol Take 2 tablets (650 mg total) by mouth every 6 (six) hours as needed for mild pain or fever. What changed:   medication strength  how much to take  when to take this  reasons to take this   amoxicillin-clavulanate 875-125 MG tablet Commonly known as: Augmentin Take 1 tablet by mouth 2 (two) times daily for 7 days.   insulin aspart protamine- aspart (70-30) 100 UNIT/ML injection Commonly known as: NovoLOG Mix 70/30 Inject 0.35 mLs (35 Units total) into the skin 2 (two) times daily.   oxyCODONE 5 MG immediate release tablet Commonly known as: Roxicodone Take 1 tablet (5 mg total) by mouth every 8 (eight) hours as needed for moderate pain or severe pain.   vitamin B-12 500 MCG tablet Commonly known as: CYANOCOBALAMIN Take 500 mcg by mouth daily.      No Known Allergies    Procedures/Studies: DG Foot Complete Right  Result Date: 03/24/2020 CLINICAL DATA:  Right great toe osteomyelitis EXAM: RIGHT FOOT COMPLETE - 3+ VIEW COMPARISON:  None. FINDINGS: Three view radiograph right foot demonstrates near complete destruction of the distal phalanx of the second digit and erosive changes involving the distal aspect of middle phalanx of the second  digit in keeping with changes of septic arthritis/osteomyelitis in this location. There is no fracture or dislocation. Remaining joint spaces appear preserved. No additional osseous erosion identified; in particular, no erosive changes identified involving the great toe. Mild diffuse soft tissue swelling of the right foot. IMPRESSION: Erosive changes involving the distal aspect of the right second digit in keeping with septic arthritis/osteomyelitis in this location with near complete destruction of the distal phalanx and erosion of the middle phalanx. No evidence of osteomyelitis involving the great toe. Diffuse soft tissue swelling.  None Electronically Signed   By: Fidela Salisbury MD   On: 03/24/2020 22:38   VAS [place] ABI WITH/WO TBI  Result Date: 03/25/2020 LOWER EXTREMITY DOPPLER STUDY Indications: Gangrene. High Risk Factors: Diabetes. Other Factors: Gangrene second toe on Right foot.  Performing Technologist: Griffin Basil RCT RDMS  Examination Guidelines: A complete evaluation includes at minimum, Doppler waveform signals and systolic blood pressure reading at the level of bilateral brachial, anterior tibial, and posterior tibial arteries, when vessel segments are accessible. Bilateral testing is considered an integral part of a complete  examination. Photoelectric Plethysmograph (PPG) waveforms and toe systolic pressure readings are included as required and additional duplex testing as needed. Limited examinations for reoccurring indications may be performed as noted.  ABI Findings: +---------+------------------+-----+---------+--------+ Right    Rt Pressure (mmHg)IndexWaveform Comment  +---------+------------------+-----+---------+--------+ Brachial 162                    triphasic         +---------+------------------+-----+---------+--------+ PTA      173               1.07 triphasic         +---------+------------------+-----+---------+--------+ DP       163               1.01  triphasic         +---------+------------------+-----+---------+--------+ Great Toe                       Absent            +---------+------------------+-----+---------+--------+ +---------+------------------+-----+---------+-------+ Left     Lt Pressure (mmHg)IndexWaveform Comment +---------+------------------+-----+---------+-------+ Brachial 160                    triphasic        +---------+------------------+-----+---------+-------+ PTA      173               1.07 triphasic        +---------+------------------+-----+---------+-------+ DP       170               1.05 triphasic        +---------+------------------+-----+---------+-------+ Great Toe                       Normal           +---------+------------------+-----+---------+-------+ +-------+-----------+-----------+------------+------------+ ABI/TBIToday's ABIToday's TBIPrevious ABIPrevious TBI +-------+-----------+-----------+------------+------------+ Right  1.07                                           +-------+-----------+-----------+------------+------------+ Left   1.07                                           +-------+-----------+-----------+------------+------------+ TOES Findings: +----------+---------------+--------+-------+ Right ToesPressure (mmHg)WaveformComment +----------+---------------+--------+-------+ 1st Digit                Absent          +----------+---------------+--------+-------+  +---------+---------------+--------+-------+ Left ToesPressure (mmHg)WaveformComment +---------+---------------+--------+-------+ 1st Digit               Normal          +---------+---------------+--------+-------+    Summary: Right: Resting right ankle-brachial index is within normal range. No evidence of significant right lower extremity arterial disease. Left: Resting left ankle-brachial index is within normal range. No evidence of significant left lower extremity  arterial disease.  *See table(s) above for measurements and observations.  Electronically signed by Deitra Mayo MD on 03/25/2020 at 4:44:16 PM.    Final       Subjective: Reports mild pain at right foot surgical site, 5-6/10 in severity.  Requests stronger pain medications than Tylenol at discharge in case he needs it.  No other complaints reported.  Discharge Exam:  Vitals:   03/26/20 0400 03/26/20 1350 03/26/20  2148 03/27/20 0612  BP: (!) 152/101 (!) 140/91 138/88 (!) 139/93  Pulse: (!) 108 99 97 94  Resp: 18 17 18 18   Temp: 100.2 F (37.9 C) 99 F (37.2 C) 99.3 F (37.4 C) 98.6 F (37 C)  TempSrc: Oral Oral Oral Oral  SpO2: 97% 100% 96% 98%  Weight:      Height:        General exam:  Pleasant young male, moderately built and nourished lying comfortably propped up in bed. Respiratory system: Clear to auscultation. Respiratory effort normal. Cardiovascular system: S1 & S2 heard, RRR. No JVD, murmurs, rubs, gallops or clicks. No pedal edema. Gastrointestinal system: Abdomen is nondistended, soft and nontender. No organomegaly or masses felt. Normal bowel sounds heard. Central nervous system: Alert and oriented. No focal neurological deficits. Extremities: Symmetric 5 x 5 power. Skin: No rashes, lesions or ulcers.  Right foot postop dressing clean and dry.  Able to move foot without difficulty. Psychiatry: Judgement and insight appear normal. Mood & affect appropriate.      The results of significant diagnostics from this hospitalization (including imaging, microbiology, ancillary and laboratory) are listed below for reference.     Microbiology: Recent Results (from the past 240 hour(s))  Culture, blood (routine x 2)     Status: None (Preliminary result)   Collection Time: 03/24/20 10:23 PM   Specimen: BLOOD LEFT HAND  Result Value Ref Range Status   Specimen Description   Final    BLOOD LEFT HAND Performed at [facility], [address],  [place], [place] [address]    Special Requests   Final    BOTTLES DRAWN AEROBIC AND ANAEROBIC Blood Culture adequate volume Performed at [place] [address] [place], [place] [address]    Culture   Final    NO GROWTH 1 DAY Performed at [facility], [place] [address], [place],  [address]    Report Status PENDING  Incomplete  SARS Coronavirus 2 by RT PCR (hospital order, performed in [facility] hospital lab) Nasopharyngeal Nasopharyngeal Swab     Status: None   Collection Time: 03/24/20 11:19 PM   Specimen: Nasopharyngeal Swab  Result Value Ref Range Status   SARS Coronavirus 2 NEGATIVE NEGATIVE Final    Comment: (NOTE) SARS-CoV-2 target nucleic acids are NOT DETECTED.  The SARS-CoV-2 RNA is generally detectable in upper and lower respiratory specimens during the acute phase of infection. The lowest concentration of SARS-CoV-2 viral copies this assay can detect is 250 copies / mL. A negative result does not preclude SARS-CoV-2 infection and should not be used as the sole basis for treatment or other patient management decisions.  A negative result may occur with improper specimen collection / handling, submission of specimen other than nasopharyngeal swab, presence of viral mutation(s) within the areas targeted by this assay, and inadequate number of viral copies (<250 copies / mL). A negative result must be combined with clinical observations, patient history, and epidemiological information.  Fact Sheet for Patients:   StrictlyIdeas.no  Fact Sheet for Healthcare Providers: BankingDealers.co.za  This test is not yet approved or  cleared by the [place] FDA and has been authorized for detection and/or diagnosis of SARS-CoV-2 by FDA under an Emergency Use Authorization (EUA).  This EUA will remain in effect (meaning this test can be used) for the duration of the COVID-19 declaration under  Section 564(b)(1) of the Act, 21 U.S.C. section 360bbb-3(b)(1), unless the authorization is terminated or revoked  sooner.  Performed at [facility], [place] [address] [place], [place] [address]   MRSA PCR Screening     Status: None   Collection Time: 03/25/20  9:41 PM   Specimen: Nasopharyngeal  Result Value Ref Range Status   MRSA by PCR NEGATIVE NEGATIVE Final    Comment:        The GeneXpert MRSA Assay (FDA approved for NASAL specimens only), is one component of a comprehensive MRSA colonization surveillance program. It is not intended to diagnose MRSA infection nor to guide or monitor treatment for MRSA infections. Performed at [facility], [place] [address], [place], [place] [address]      Labs: CBC: Recent Labs  Lab 03/24/20 2223 03/25/20 0436 03/26/20 0835 03/27/20 0536  WBC 11.5* 11.3* 15.5* 12.2*  NEUTROABS 8.2*  --   --   --   HGB 8.7* 9.1* 10.0* 9.0*  HCT 27.4* 28.3* 32.3* 28.7*  MCV 83.5 83.0 86.1 84.2  PLT 311 323 377 208    Basic Metabolic Panel: Recent Labs  Lab 03/24/20 2223 03/25/20 0436 03/26/20 0835 03/27/20 0536  NA 132* 136 134* 132*  K 3.7 3.8 4.2 3.8  CL 99 100 103 100  CO2 26 24 21* 24  GLUCOSE 216* 223* 171* 149*  BUN 18 18 17 16   CREATININE 1.27* 1.20 1.25* 1.14  CALCIUM 7.9* 8.1* 7.7* 7.8*    Liver Function Tests: No results for input(s): AST, ALT, ALKPHOS, BILITOT, PROT, ALBUMIN in the last 168 hours.  CBG: Recent Labs  Lab 03/26/20 0741 03/26/20 1150 03/26/20 1632 03/26/20 2151 03/27/20 0731  GLUCAP 160* 231* 202* 209* 141*    Hgb A1c Recent Labs    03/24/20 2223  HGBA1C 11.1*      Time coordinating discharge: 35 minutes  SIGNED:  Vernell Leep, MD, [place], [facility]. [facility]  To contact the attending provider between 7A-7P or the covering provider during after hours 7P-7A, please log into the web site [email or web address] and access using universal [place]  password for that web site. If you do not have the password, please call the hospital operator.

## 2020-03-27 NOTE — Discharge Instructions (Signed)

## 2020-03-27 NOTE — Progress Notes (Signed)
Orthopedic Tech Progress Note Patient Details:  Todd Mccoy February 22, 1971 [id number]  Patient ID: Todd Mccoy, male   DOB: [birth year], 49 y.o.   MRN: [id number]   Todd Mccoy 03/27/2020, 11:55 AM XL post op shoe applied to left ft

## 2020-03-27 NOTE — Plan of Care (Signed)
Pt VS WNL.  No complaints at this time.   Problem: Education: Goal: Knowledge of General Education information will improve Description: Including pain rating scale, medication(s)/side effects and non-pharmacologic comfort measures Outcome: Progressing   Problem: Health Behavior/Discharge Planning: Goal: Ability to manage health-related needs will improve Outcome: Progressing   Problem: Clinical Measurements: Goal: Ability to maintain clinical measurements within normal limits will improve Outcome: Progressing Goal: Will remain free from infection Outcome: Progressing Goal: Diagnostic test results will improve Outcome: Progressing Goal: Respiratory complications will improve Outcome: Progressing Goal: Cardiovascular complication will be avoided Outcome: Progressing   Problem: Activity: Goal: Risk for activity intolerance will decrease Outcome: Progressing   Problem: Nutrition: Goal: Adequate nutrition will be maintained Outcome: Progressing   Problem: Coping: Goal: Level of anxiety will decrease Outcome: Progressing   Problem: Elimination: Goal: Will not experience complications related to bowel motility Outcome: Progressing Goal: Will not experience complications related to urinary retention Outcome: Progressing   Problem: Pain Managment: Goal: General experience of comfort will improve Outcome: Progressing   Problem: Safety: Goal: Ability to remain free from injury will improve Outcome: Progressing   Problem: Skin Integrity: Goal: Risk for impaired skin integrity will decrease Outcome: Progressing   

## 2020-03-28 NOTE — Anesthesia Postprocedure Evaluation (Signed)
Anesthesia Post Note  Patient: Todd Mccoy  Procedure(s) Performed: AMPUTATION TOE (Right Toe)     Patient location during evaluation: PACU Anesthesia Type: MAC Level of consciousness: awake and alert Pain management: pain level controlled Vital Signs Assessment: post-procedure vital signs reviewed and stable Respiratory status: spontaneous breathing, nonlabored ventilation, respiratory function stable and patient connected to nasal cannula oxygen Cardiovascular status: stable and blood pressure returned to baseline Postop Assessment: no apparent nausea or vomiting Anesthetic complications: no   No complications documented.  Last Vitals:  Vitals:   03/26/20 2148 03/27/20 0612  BP: 138/88 (!) 139/93  Pulse: 97 94  Resp: 18 18  Temp: 37.4 C 37 C  SpO2: 96% 98%    Last Pain:  Vitals:   03/27/20 0730  TempSrc:   PainSc: 4                  Pride Gonzales S

## 2020-03-30 LAB — CULTURE, BLOOD (ROUTINE X 2)
Culture: NO GROWTH
Special Requests: ADEQUATE

## 2020-04-16 NOTE — Progress Notes (Signed)
Patient ID: Todd Mccoy, male   DOB: [birth year], 49 y.o.   MRN: [id number]    Todd Mccoy, is a 49 y.o. male  UMP:[id number]  [id number]  DOB - January 07, 1971  Subjective:  Chief Complaint and HPI: Todd Mccoy is a 49 y.o. male here today to establish care and for a follow up visit after hospitalization 9/20-9/23/2021 after 2nd R toe amputation/osteomyelitis and uncontrolled diabetes. He says he has f/up appt with surgeon next week.  No fever.  Pain is much improved.  No redness.  He says he is taking his insulin and checking his sugars;  However, he can not tell me any home readings.  He has a history of poor compliance.  His appetite is good.  He is still smoking but is trying to cut back.    Discharge Diagnoses:  Active Problems:   Toe osteomyelitis [facility])   Brief Summary: 49 year old male with PMH of IDDM, presented to ED with acute onset of worsening right second toe swelling with blackish discoloration, erythema, warmth, pain ongoing for 2-1/2 weeks PTA. Admitted for right second toe wet gangrene with osteomyelitis. S/p right second toe amputation by podiatry on 9/21. Improving.   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 Podiatry follow-up 9/22 appreciated. Dressing changed, wound appeared to be perfused with no plans for vascular intervention at this time but may need at a later date, WBAT in surgical shoe, has outpatient follow-up with Dr. March Rummage next Tuesday, continued IV antibiotics for yesterday with recommendations for DC today on Augmentin x7 days. Was on cefepime and vancomycin. Bilateral ABIs within normal range. Blood culture x1: Negative to date.  Patient doing well.  Mild pain.  Has used maybe 2 doses per day of IV morphine.  Reviewed PDMP and has not utilized opioid pain medications recently.  Patient states that he has used and tolerated Percocet and Vicodin in  the past.  Short supply of OxyIR ordered as below with detailed counseling regarding benefits, risks, side effects and precautionary measures.  He verbalized understanding.  Type II DM/IDDM with hyperglycemia: A1c 11.1 on 9/20 suggests very poor outpatient control.  Suspect poor compliance with multiple aspects including diet, insulin.  States that he does not have a PCP and has not seen one in a long time but just picks up prescriptions from the pharmacy.  TOC to arrange new PCP and patient advise close follow-up for further management and adjustment of medications.  He verbalized understanding.  Anemia of chronic disease: Stable. Outpatient follow-up.   Stage II chronic kidney disease:  Creatinine down to 1.14 on day of discharge.  Possible hypertension: Blood pressures intermittently elevated and fluctuating. He may have underlying hypertension and pain may be contributing.  Close outpatient follow-up and may consider starting maintenance antihypertensives i.e. ACEI or ARB given diabetes history.  Tobacco abuse: Cessation counseled.   ED/Hospital notes reviewed.    ROS:   Constitutional:  No f/c, No night sweats, No unexplained weight loss. EENT:  No vision changes, No blurry vision, No hearing changes. No mouth, throat, or ear problems.  Respiratory: No cough, No SOB Cardiac: No CP, no palpitations GI:  No abd pain, No N/V/D. GU: No Urinary s/sx Musculoskeletal: No joint pain Neuro: No headache, no dizziness, no motor weakness.  Skin: No rash Endocrine:  No polydipsia. No polyuria.  Psych: Denies SI/HI  No problems updated.  ALLERGIES: No Known Allergies  PAST MEDICAL HISTORY:  Past Medical History:  Diagnosis Date  . Diabetes mellitus without complication ([place])   . 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[place]) 03/25/2020   pt states retinal eye damage- left worse than the right- recent visited MD   . 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[place]) 03/25/2020   pt  states recent MD visit /left eye worse than right    MEDICATIONS AT HOME: Prior to Admission medications   Medication Sig Start Date End Date Taking? Authorizing Provider  acetaminophen (TYLENOL) 325 MG tablet Take 2 tablets (650 mg total) by mouth every 6 (six) hours as needed for mild pain or fever. 03/27/20  Yes Hongalgi, Lenis Dickinson, MD  insulin aspart protamine- aspart (NOVOLOG MIX 70/30) (70-30) 100 UNIT/ML injection Inject 0.35 mLs (35 Units total) into the skin 2 (two) times daily. 04/17/20  Yes Argentina Donovan, PA-C  vitamin B-12 (CYANOCOBALAMIN) 500 MCG tablet Take 500 mcg by mouth daily.   Yes [provider]  Insulin Syringe-Needle U-100 (SAFETY INSULIN SYRINGES) 30G X 1/2" 1 ML MISC 35 Units by Does not apply route 2 (two) times daily before a meal. 04/17/20   Julanne Schlueter, Dionne Bucy, PA-C  lisinopril (ZESTRIL) 5 MG tablet Take 1 tablet (5 mg total) by mouth daily. 04/17/20   Argentina Donovan, PA-C  oxyCODONE (ROXICODONE) 5 MG immediate release tablet Take 1 tablet (5 mg total) by mouth every 8 (eight) hours as needed for moderate pain or severe pain. Patient not taking: Reported on 04/17/2020 03/27/20   Modena Jansky, MD     Objective:  EXAM:   Vitals:   04/17/20 1529  BP: (!) 144/80  Pulse: (!) 104  Temp: 97.7 F (36.5 C)  TempSrc: Temporal  SpO2: 98%  Weight: 195 lb (88.5 kg)  Height: 5\' 10"  (1.778 m)    General appearance : A&OX3. NAD. Non-toxic-appearing HEENT: Atraumatic and Normocephalic.  PERRLA. EOM intact.  Chest/Lungs:  Breathing-non-labored, Good air entry bilaterally, breath sounds normal without rales, rhonchi, or wheezing  CVS: S1 S2 regular, no murmurs, gallops, rubs  R foot-2nd toe amputation/base is healing well without active drainage.  No erythema. redressed Extremities: Bilateral Lower Ext shows no edema, both legs are warm to touch with = pulse throughout Neurology:  CN II-XII grossly intact, Non focal.   Psych:  TP linear. J/I poor. Normal  speech. Appropriate eye contact and blunted affect.  Skin:  No Rash  Data Review Lab Results  Component Value Date   HGBA1C 11.1 (H) 03/24/2020   HGBA1C 13.40 09/13/2014   HGBA1C 13.1 (H) 09/07/2014     Assessment & Plan   1. 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 complication, unspecified whether long term insulin use (HCC) Uncontrolled.  Doubt compliance.  Discussed the importance of this discussed at length. - Glucose (CBG) - Comprehensive metabolic panel - insulin aspart protamine- aspart (NOVOLOG MIX 70/30) (70-30) 100 UNIT/ML injection; Inject 0.35 mLs (35 Units total) into the skin 2 (two) times daily.  Dispense: 60 mL; Refill: 3 - Insulin Syringe-Needle U-100 (SAFETY INSULIN SYRINGES) 30G X 1/2" 1 ML MISC; 35 Units by Does not apply route 2 (two) times daily before a meal.  Dispense: 100 each; Refill: 1  2. Toe osteomyelitis (HCC) S/p amputation.  No infection or abnormal drainage.  Keep appt with surgeon - CBC with Differential/Platelet  3. Hyponatremia - Comprehensive metabolic panel  4. Hypertension, unspecified type Will start lisinopril for kidney protection - Comprehensive metabolic panel - lisinopril (ZESTRIL) 5 MG tablet; Take  1 tablet (5 mg total) by mouth daily.  Dispense: 90 tablet; Refill: 3  5. Anemia, unspecified type - CBC with Differential/Platelet  6. Noncompliance compliance imperative-discussed at length.  Patient verbalizes understanding   Patient have been counseled extensively about nutrition and exercise  Return for 3 weeks with Lurena Joiner for DM and htn and 2 months appt to assign PCP.  The patient was given clear instructions to go to ER or return to medical center if symptoms don't improve, worsen or new problems develop. The patient verbalized understanding. The patient was told to call to get lab results if they haven't heard anything in the next week.     Freeman Caldron, PA-C [facility] and [place] [place], [place]   04/17/2020, 3:53 PM

## 2020-04-17 ENCOUNTER — Other Ambulatory Visit: Payer: Self-pay | Admitting: Physician Assistant

## 2020-04-17 ENCOUNTER — Other Ambulatory Visit: Payer: Self-pay

## 2020-04-17 ENCOUNTER — Ambulatory Visit: Payer: Self-pay | Attending: Physician Assistant | Admitting: Physician Assistant

## 2020-04-17 ENCOUNTER — Encounter: Payer: Self-pay | Admitting: Physician Assistant

## 2020-04-17 VITALS — BP 144/80 | HR 104 | Temp 97.7°F | Ht 70.0 in | Wt 195.0 lb

## 2020-04-17 DIAGNOSIS — M869 Osteomyelitis, unspecified: Secondary | ICD-10-CM

## 2020-04-17 DIAGNOSIS — I10 Essential (primary) hypertension: Secondary | ICD-10-CM

## 2020-04-17 DIAGNOSIS — Z9119 Patient's noncompliance with other medical treatment and regimen: Secondary | ICD-10-CM

## 2020-04-17 DIAGNOSIS — E11628 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 complications: Secondary | ICD-10-CM

## 2020-04-17 DIAGNOSIS — D649 Anemia, unspecified: Secondary | ICD-10-CM

## 2020-04-17 DIAGNOSIS — Z91199 Patient's noncompliance with other medical treatment and regimen due to unspecified reason: Secondary | ICD-10-CM

## 2020-04-17 DIAGNOSIS — E871 Hypo-osmolality and hyponatremia: Secondary | ICD-10-CM

## 2020-04-17 LAB — GLUCOSE, POCT (MANUAL RESULT ENTRY): POC Glucose: 283 mg/dl — AB (ref 70–99)

## 2020-04-17 MED ORDER — "SAFETY INSULIN SYRINGES 30G X 1/2"" 1 ML MISC": 35.0000 [IU] | Freq: Two times a day (BID) | 1 refills | Status: DC

## 2020-04-17 MED ORDER — INSULIN ASPART PROT & ASPART (70-30 MIX) 100 UNIT/ML ~~LOC~~ SUSP: 35.0000 [IU] | Freq: Two times a day (BID) | SUBCUTANEOUS | 3 refills | Status: DC

## 2020-04-17 MED ORDER — LISINOPRIL 5 MG PO TABS: 5.0000 mg | ORAL_TABLET | Freq: Every day | ORAL | 3 refills | Status: DC

## 2020-04-17 MED FILL — NOVOLOG MIX 70/30 VIAL: (70-30) 100 | 28 days supply | Qty: 20 | Fill #0

## 2020-04-17 NOTE — Patient Instructions (Addendum)
Check blood sugars at least fasting and at bedtime and record and bring to next visit.  Drink 80-100 ounces water daily.  Take all of your meds as prescribed.  Keep appt with surgeon next week.

## 2020-04-18 LAB — CBC WITH DIFFERENTIAL/PLATELET
Basophils Absolute: 0 10*3/uL (ref 0.0–0.2)
Basos: 0 %
EOS (ABSOLUTE): 0.1 10*3/uL (ref 0.0–0.4)
Eos: 2 %
Hematocrit: 26.3 % — ABNORMAL LOW (ref 37.5–51.0)
Hemoglobin: 8.4 g/dL — ABNORMAL LOW (ref 13.0–17.7)
Immature Grans (Abs): 0 10*3/uL (ref 0.0–0.1)
Immature Granulocytes: 0 %
Lymphocytes Absolute: 1.9 10*3/uL (ref 0.7–3.1)
Lymphs: 47 %
MCH: 27.1 pg (ref 26.6–33.0)
MCHC: 31.9 g/dL (ref 31.5–35.7)
MCV: 85 fL (ref 79–97)
Monocytes Absolute: 0.4 10*3/uL (ref 0.1–0.9)
Monocytes: 10 %
Neutrophils Absolute: 1.7 10*3/uL (ref 1.4–7.0)
Neutrophils: 41 %
Platelets: 197 10*3/uL (ref 150–450)
RBC: 3.1 x10E6/uL — ABNORMAL LOW (ref 4.14–5.80)
RDW: 14.8 % (ref 11.6–15.4)
WBC: 4.2 10*3/uL (ref 3.4–10.8)

## 2020-04-18 LAB — COMPREHENSIVE METABOLIC PANEL
ALT: 15 IU/L (ref 0–44)
AST: 17 IU/L (ref 0–40)
Albumin/Globulin Ratio: 0.7 — ABNORMAL LOW (ref 1.2–2.2)
Albumin: 2.4 g/dL — ABNORMAL LOW (ref 4.0–5.0)
Alkaline Phosphatase: 124 IU/L — ABNORMAL HIGH (ref 44–121)
BUN/Creatinine Ratio: 13 (ref 9–20)
BUN: 17 mg/dL (ref 6–24)
Bilirubin Total: 0.5 mg/dL (ref 0.0–1.2)
CO2: 24 mmol/L (ref 20–29)
Calcium: 7.8 mg/dL — ABNORMAL LOW (ref 8.7–10.2)
Chloride: 104 mmol/L (ref 96–106)
Creatinine, Ser: 1.33 mg/dL — ABNORMAL HIGH (ref 0.76–1.27)
GFR calc Af Amer: 73 mL/min/{1.73_m2} (ref >59)
GFR calc non Af Amer: 63 mL/min/{1.73_m2} (ref >59)
Globulin, Total: 3.5 g/dL (ref 1.5–4.5)
Glucose: 279 mg/dL — ABNORMAL HIGH (ref 65–99)
Potassium: 3.8 mmol/L (ref 3.5–5.2)
Sodium: 139 mmol/L (ref 134–144)
Total Protein: 5.9 g/dL — ABNORMAL LOW (ref 6.0–8.5)

## 2020-04-18 MED FILL — TRUEPLUS SYR 1ML 30GX5/16: 30G X 5/16" | 50 days supply | Qty: 100 | Fill #0

## 2020-04-18 MED FILL — LISINOPRIL 5 MG TABLET: 5 | 30 days supply | Qty: 30 | Fill #0

## 2020-04-23 ENCOUNTER — Other Ambulatory Visit: Payer: Self-pay | Admitting: Physician Assistant

## 2020-04-23 DIAGNOSIS — D649 Anemia, unspecified: Secondary | ICD-10-CM

## 2020-04-24 ENCOUNTER — Telehealth: Payer: Self-pay | Admitting: *Deleted

## 2020-04-24 NOTE — Telephone Encounter (Signed)
Left message on with patient mother to return call. Will route message

## 2020-04-24 NOTE — Telephone Encounter (Signed)
-----   Message from Argentina Donovan, [place] sent at 04/21/2020  8:16 AM EDT ----- Please call patient. His diabetes is not controlled-as we discussed. His anemia/blood count has dropped a little. This could possibly be from his recent surgery. Get Over the counter iron supplements and take 1 tablet 2 times daily. Drink 80-100 ounces of water daily. Also, I will place a referral to GI when I am back in the office Wednesday. Follow-up as planned. Thanks, Freeman Caldron, PA-C

## 2020-04-25 ENCOUNTER — Encounter: Payer: Self-pay | Admitting: Physician Assistant

## 2020-05-08 ENCOUNTER — Ambulatory Visit: Payer: Self-pay | Admitting: Pharmacist

## 2020-05-13 ENCOUNTER — Ambulatory Visit: Payer: Self-pay | Attending: Family Medicine | Admitting: Pharmacist

## 2020-05-13 ENCOUNTER — Encounter: Payer: Self-pay | Admitting: Pharmacist

## 2020-05-13 ENCOUNTER — Encounter: Payer: Self-pay | Admitting: Physician Assistant

## 2020-05-13 ENCOUNTER — Ambulatory Visit (INDEPENDENT_AMBULATORY_CARE_PROVIDER_SITE_OTHER): Payer: Self-pay | Admitting: Physician Assistant

## 2020-05-13 ENCOUNTER — Other Ambulatory Visit (INDEPENDENT_AMBULATORY_CARE_PROVIDER_SITE_OTHER): Payer: Self-pay

## 2020-05-13 ENCOUNTER — Other Ambulatory Visit: Payer: Self-pay | Admitting: Family Medicine

## 2020-05-13 ENCOUNTER — Other Ambulatory Visit: Payer: Self-pay

## 2020-05-13 VITALS — BP 150/80 | HR 102 | Ht 70.0 in | Wt 206.8 lb

## 2020-05-13 VITALS — BP 168/111 | HR 102

## 2020-05-13 DIAGNOSIS — E11628 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 complications: Secondary | ICD-10-CM

## 2020-05-13 DIAGNOSIS — D649 Anemia, unspecified: Secondary | ICD-10-CM

## 2020-05-13 LAB — CBC WITH DIFFERENTIAL/PLATELET
Basophils Absolute: 0 10*3/uL (ref 0.0–0.1)
Basophils Relative: 0.1 % (ref 0.0–3.0)
Eosinophils Absolute: 0.1 10*3/uL (ref 0.0–0.7)
Eosinophils Relative: 2.2 % (ref 0.0–5.0)
HCT: 27.9 % — ABNORMAL LOW (ref 39.0–52.0)
Hemoglobin: 9 g/dL — ABNORMAL LOW (ref 13.0–17.0)
Lymphocytes Relative: 27.4 % (ref 12.0–46.0)
Lymphs Abs: 1.5 10*3/uL (ref 0.7–4.0)
MCHC: 32.5 g/dL (ref 30.0–36.0)
MCV: 81.2 fl (ref 78.0–100.0)
Monocytes Absolute: 0.5 10*3/uL (ref 0.1–1.0)
Monocytes Relative: 9.5 % (ref 3.0–12.0)
Neutro Abs: 3.3 10*3/uL (ref 1.4–7.7)
Neutrophils Relative %: 60.8 % (ref 43.0–77.0)
Platelets: 264 10*3/uL (ref 150.0–400.0)
RBC: 3.43 Mil/uL — ABNORMAL LOW (ref 4.22–5.81)
RDW: 17.3 % — ABNORMAL HIGH (ref 11.5–15.5)
WBC: 5.4 10*3/uL (ref 4.0–10.5)

## 2020-05-13 LAB — IBC PANEL
Iron: 24 ug/dL — ABNORMAL LOW (ref 42–165)
Saturation Ratios: 9.9 % — ABNORMAL LOW (ref 20.0–50.0)
Transferrin: 173 mg/dL — ABNORMAL LOW (ref 212.0–360.0)

## 2020-05-13 LAB — GLUCOSE, POCT (MANUAL RESULT ENTRY): POC Glucose: 298 mg/dl — AB (ref 70–99)

## 2020-05-13 MED ORDER — METFORMIN HCL ER 500 MG PO TB24: 500.0000 mg | ORAL_TABLET | Freq: Two times a day (BID) | ORAL | 2 refills | Status: DC

## 2020-05-13 MED ORDER — LOSARTAN POTASSIUM 50 MG PO TABS: 50.0000 mg | ORAL_TABLET | Freq: Every day | ORAL | 2 refills | Status: DC

## 2020-05-13 MED ORDER — AMLODIPINE BESYLATE 5 MG PO TABS: 5.0000 mg | ORAL_TABLET | Freq: Every day | ORAL | 3 refills | Status: DC

## 2020-05-13 MED ORDER — LANTUS SOLOSTAR 100 UNIT/ML ~~LOC~~ SOPN: 50.0000 [IU] | PEN_INJECTOR | Freq: Every day | SUBCUTANEOUS | 2 refills | Status: DC

## 2020-05-13 MED ORDER — TRUEPLUS 5-BEVEL PEN NEEDLES 32G X 4 MM MISC: 2 refills | Status: DC

## 2020-05-13 MED FILL — LOSARTAN POTASSIUM 50 MG TA: 50 | 30 days supply | Qty: 30 | Fill #0

## 2020-05-13 MED FILL — AMLODIPINE BESYLATE 5 MG TA: 5 | 30 days supply | Qty: 30 | Fill #0

## 2020-05-13 MED FILL — metFORMIN HCL ER 500 MG TB2: 500 | 30 days supply | Qty: 60 | Fill #0

## 2020-05-13 MED FILL — !LANTUS SOLOSTAR 100UNITS/M: 100 | 30 days supply | Qty: 15 | Fill #0

## 2020-05-13 MED FILL — TRUEplus 5-BEVEL PEN NEEDLE: 32G X 4 MM | 30 days supply | Qty: 100 | Fill #0

## 2020-05-13 NOTE — Patient Instructions (Signed)
Thank you for coming to see me today. Please do the following:  1. Stop 70/30.  2. Start Lantus. Inject 50 units once daily.  3. Start taking metformin 500 mg XR. Take 1 tablet after breakfast and 1 tablet after dinner.  4. Continue checking blood sugars at home.  5. Continue making the lifestyle changes we've discussed together during our visit. Diet and exercise play a significant role in improving your blood sugars.  6. Follow-up with me in 1 week.    Hypoglycemia or low blood sugar:   Low blood sugar can happen quickly and may become an emergency if not treated right away.   While this shouldn't happen often, it can be brought upon if you skip a meal or do not eat enough. Also, if your insulin or other diabetes medications are dosed too high, this can cause your blood sugar to go to low.   Warning signs of low blood sugar include: 1. Feeling shaky or dizzy 2. Feeling weak or tired  3. Excessive hunger 4. Feeling anxious or upset  5. Sweating even when you aren't exercising  What to do if I experience low blood sugar? 1. Check your blood sugar with your meter. If lower than 70, proceed to step 2.  2. Treat with 3-4 glucose tablets or 3 packets of regular sugar. If these aren't around, you can try hard candy. Yet another option would be to drink 4 ounces of fruit juice or 6 ounces of REGULAR soda.  3. Re-check your sugar in 15 minutes. If it is still below 70, do what you did in step 2 again. If has come back up, go ahead and eat a snack or small meal at this time.

## 2020-05-13 NOTE — Progress Notes (Signed)
Subjective:    Patient ID: Todd Mccoy, male    DOB: 05-Jun-1971, 49 y.o.   MRN: [id number]  HPI Todd Mccoy is a pleasant 49 year old African-American maleerican male, new to GI today referred by Freeman Caldron, PA-C/community health and wellness for evaluation of anemia. Patient has not had any prior GI evaluation.  He does have history of poorly controlled adult onset diabetes mellitus, prior history of necrotizing fasciitis, chronic kidney disease stage II, and hypertension. He had recent hospitalization in September 2021 for osteomyelitis of the right foot and required amputation of the right second toe. Reviewing labs he has been noted to be anemic since earlier this year when labs were checked in May 2021 and found to have hemoglobin 9.7 hematocrit of 31.3. In 2017 hemoglobin was 13.7 hematocrit of 39.9. Labs 03/25/2020 hemoglobin 9.1 hematocrit of 28.3, MCV 83, platelets 323. Repeat labs 04/17/2020 hemoglobin 8.4 hematocrit of 26.3. He has not had iron studies done. Patient has no GI symptoms other than occasional heartburn or indigestion.  No complaints of dysphagia or odynophagia.  He has no complaints of abdominal pain, has not noticed any changes in his bowel habits nor any melena or hematochezia.  He says his appetite has been fine and his weight has been stable.  Energy level about average for him. No regular aspirin or NSAID use and no EtOH. No family history of GI disease as far as he is aware.  Review of Systems Pertinent positive and negative review of systems were noted in the above HPI section.  All other review of systems was otherwise negative.  Outpatient Encounter Medications as of 05/13/2020  Medication Sig  . acetaminophen (TYLENOL) 325 MG tablet Take 2 tablets (650 mg total) by mouth every 6 (six) hours as needed for mild pain or fever.  Marland Kitchen amLODipine (NORVASC) 5 MG tablet Take 1 tablet (5 mg total) by mouth daily.  . insulin glargine (LANTUS SOLOSTAR) 100 UNIT/ML Solostar  Pen Inject 50 Units into the skin daily.  . Insulin Pen Needle (TRUEPLUS 5-BEVEL PEN NEEDLES) 32G X 4 MM MISC Use to inject insulin daily.  . Insulin Syringe-Needle U-100 (SAFETY INSULIN SYRINGES) 30G X 1/2" 1 ML MISC 35 Units by Does not apply route 2 (two) times daily before a meal.  . losartan (COZAAR) 50 MG tablet Take 1 tablet (50 mg total) by mouth daily.  . metFORMIN (GLUCOPHAGE-XR) 500 MG 24 hr tablet Take 1 tablet (500 mg total) by mouth 2 (two) times daily.  . vitamin B-12 (CYANOCOBALAMIN) 500 MCG tablet Take 500 mcg by mouth daily.  . [DISCONTINUED] oxyCODONE (ROXICODONE) 5 MG immediate release tablet Take 1 tablet (5 mg total) by mouth every 8 (eight) hours as needed for moderate pain or severe pain.   No facility-administered encounter medications on file as of 05/13/2020.   No Known Allergies Patient Active Problem List   Diagnosis Date Noted  . Osteomyelitis of second toe of right foot ([place])   . 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[place])   . Cellulitis and abscess of foot   . Toe osteomyelitis ([place]) 03/24/2020  . Necrotizing fasciitis ([place]) 09/08/2014  . Diabetes mellitus with hyperglycemia ([place]) 09/07/2014  . Tobacco abuse 09/07/2014  . Abscess of back   . Abscess of lower back 09/06/2014  . DM2 (diabetes mellitus, type 2) ([place]) 09/06/2014  . Sepsis ([place]) 09/06/2014  . Scrotal abscess 06/20/2014   Social History   Socioeconomic History  . Marital status: Single    Spouse name: Not  on file  . Number of children: Not on file  . Years of education: Not on file  . Highest education level: Not on file  Occupational History  . Not on file  Tobacco Use  . Smoking status: Current Every Day Smoker    Packs/day: 0.50    Types: Cigarettes  . Smokeless tobacco: Never Used  Substance and Sexual Activity  . Alcohol use: No  . Drug use: No  . Sexual activity: Not on file  Other Topics Concern  . Not on file  Social History Narrative  . Not on file   Social Determinants  of Health   Financial Resource Strain:   . Difficulty of Paying Living Expenses: Not on file  Food Insecurity:   . Worried About Charity fundraiser in the Last Year: Not on file  . Ran Out of Food in the Last Year: Not on file  Transportation Needs:   . Lack of Transportation (Medical): Not on file  . Lack of Transportation (Non-Medical): Not on file  Physical Activity:   . Days of Exercise per Week: Not on file  . Minutes of Exercise per Session: Not on file  Stress:   . Feeling of Stress : Not on file  Social Connections:   . Frequency of Communication with Friends and Family: Not on file  . Frequency of Social Gatherings with Friends and Family: Not on file  . Attends Religious Services: Not on file  . Active Member of Clubs or Organizations: Not on file  . Attends Archivist Meetings: Not on file  . Marital Status: Not on file  Intimate Partner Violence:   . Fear of Current or Ex-Partner: Not on file  . Emotionally Abused: Not on file  . Physically Abused: Not on file  . Sexually Abused: Not on file    Todd Mccoy's family history includes Diabetes in his brother, father, and mother.      Objective:    Vitals:   05/13/20 1535  BP: (!) 150/80  Pulse: (!) 102  SpO2: 99%    Physical Exam Well-developed well-nourished  AA male  in no acute distress.  Height, Weight,206  BMI 29.6  HEENT; nontraumatic normocephalic, EOMI, PE R LA, sclera anicteric. Oropharynx; not examined Neck; supple, no JVD Cardiovascular; regular rate and rhythm with S1-S2, no murmur rub or gallop Pulmonary; Clear bilaterally Abdomen; soft, nontender, nondistended, no palpable mass or hepatosplenomegaly, bowel sounds are active Rectal; not done today Skin; benign exam, no jaundice rash or appreciable lesions Extremities; no clubbing cyanosis brawny edema bilateral lower extremities skin warm and dry, patient is wearing a boot on the right foot  neuro/Psych; alert and oriented x4,  grossly nonfocal mood and affect appropriate       Assessment & Plan:   #90 49 year old African-American maleAmerican male with chronic normocytic anemia noted over at least the past 6 months. Patient is asymptomatic and without any GI complaints. Rule out anemia secondary to occult GI blood loss, rule out iron deficiency, rule out B12/folate deficiency, rule out myelodysplastic disorder.  #2 colon cancer screening-no prior colonoscopy #3 adult onset diabetes mellitus-poorly controlled 4.  Hypertension 5.  Chronic kidney disease stage II baseline creatinine 1.2 6.  History of osteomyelitis of the right foot status post very recent amputation of the right second toe September 2021  Plan; Patient will be scheduled for colonoscopy and EGD with Dr. Carlean Purl.  Both procedures were discussed in detail with patient including indications risks and benefits and  he is agreeable to proceed. Patient has not completed COVID-19 vaccination, will be tested 48 hours prior to procedures. Repeat CBC today, check iron studies B12 and folate. Further recommendation pending results of above.  Sabryn Preslar S Deshae Dickison PA-C 05/13/2020   Cc: Argentina Donovan, PA-C

## 2020-05-13 NOTE — Patient Instructions (Addendum)
If you are age 48 or older, your body mass index should be between 23-30. Your Body mass index is 29.67 kg/m. If this is out of the aforementioned range listed, please consider follow up with your Primary Care Provider.  If you are age 75 or younger, your body mass index should be between 19-25. Your Body mass index is 29.67 kg/m. If this is out of the aformentioned range listed, please consider follow up with your Primary Care Provider.   Your provider has requested that you go to the basement level for lab work before leaving today. Press "B" on the elevator. The lab is located at the first door on the left as you exit the elevator.  You have been scheduled for an endoscopy and colonoscopy. Please follow the written instructions given to you at your visit today. Please pick up your prep supplies at the pharmacy within the next 1-3 days. If you use inhalers (even only as needed), please bring them with you on the day of your procedure.  Follow up pending the results of your labs and your procedures

## 2020-05-13 NOTE — Progress Notes (Signed)
    S:    PCP: None assigned   No chief complaint on file.  Patient arrives in good spirits. Presents for diabetes evaluation, education, and management. Patient was referred on 04/17/20 by Levada Dy. Patient has a sched appt coming up with Dr. Joya Gaskins on 06/17/20.   Patient reports Diabetes was diagnosed in >10 years ago. Reports being on insulin for as long as he can remember. Was on metformin but claims this was stopped d/t "high A1c". Denies hx of pancreatitis. No fhx or personal hx of thyroid cancer.     Family/Social History:  - FHx: DM - Tobacco: current 0.5 PPD smoker  - Alcohol: denies current use   Insurance coverage/medication affordability: self pay  Medication adherence:   Current diabetes medications include: -  Novolog 70/30 35 units BID. Taking however did not take this morning's dose.  Current hypertension medications include:  - lisinopril 5 mg daily (not taking) Current hyperlipidemia medications include: none  Patient denies hypoglycemic events.  Patient reported dietary habits:  - Admits to dietary indiscretion  Patient-reported exercise habits: none    Patient denies nocturia (nighttime urination).  Patient reports neuropathy (nerve pain). Patient reports visual changes. Patient reports self foot exams.     O:  POCT glucose: 298   Lab Results  Component Value Date   HGBA1C 11.1 (H) 03/24/2020   Vitals:   05/13/20 1207  BP: (!) 168/111  Pulse: (!) 102    Lipid Panel  No results found for: CHOL, TRIG, HDL, CHOLHDL, VLDL, LDLCALC, LDLDIRECT  Home fasting blood sugars: "all over the place" - does not have meter today    Clinical Atherosclerotic Cardiovascular Disease (ASCVD): No  The ASCVD Risk score Mikey Bussing DC Jr., et al., 2013) failed to calculate for the following reasons:   Cannot find a previous HDL lab   Cannot find a previous total cholesterol lab    A/P: Diabetes longstanding currently uncontrolled. Patient is able to verbalize  appropriate hypoglycemia management plan. Medication adherence is not optimal, pt desires to change to daily insulin vs BID. Control is suboptimal due to physical inactivity and dietary indiscretion. -Started metformin 500 mg XR BID -Started Lantus 50 units daily.   -Discontinued Novolog 70/30 -Extensively discussed pathophysiology of diabetes, recommended lifestyle interventions, dietary effects on blood sugar control -Counseled on s/sx of and management of hypoglycemia -Next A1C anticipated 06/2021.   Hypertension longstanding currently uncontrolled.  Blood pressure goal = <130/80 mmHg. Medication adherence denied. Will d/c lisinopril and send for ARB + CCB given his severely elevated BP today. Blood pressure control is suboptimal due to medication noncompliance. -Stop lisinopril 5 mg daily.  -Start losartan 50 mg daily.  -Start amlodipine 5 mg daily.   ASCVD risk - primary prevention in patient with diabetes. Needs lipid panel and at least moderate intensity statin therapy. Will address next week at f/u.    Written patient instructions provided.  Total time in face to face counseling 30 minutes.   Follow up Pharmacist Clinic Visit in 1 week.   Benard Halsted, PharmD, [place] [phone]

## 2020-05-14 LAB — B12 AND FOLATE PANEL
Folate: 6.6 ng/mL (ref >5.9)
Vitamin B-12: 315 pg/mL (ref 211–911)

## 2020-05-14 LAB — FERRITIN: Ferritin: 79 ng/mL (ref 22.0–322.0)

## 2020-05-19 ENCOUNTER — Ambulatory Visit: Payer: Self-pay | Admitting: Pharmacist

## 2020-05-20 ENCOUNTER — Telehealth: Payer: Self-pay

## 2020-05-20 NOTE — Telephone Encounter (Signed)
Left message to please  Call back

## 2020-05-20 NOTE — Telephone Encounter (Signed)
-----   Message from Alfredia Ferguson, PA-C sent at 05/19/2020  9:15 AM EST ----- Please let pt know his labs show hgb is stable at 9.0, B12 and folate normal, not iron deficient , anemia may be what is called anemia of chronic disease - he is scheduled for EGD and COLON

## 2020-05-23 ENCOUNTER — Other Ambulatory Visit: Payer: Self-pay

## 2020-05-23 ENCOUNTER — Ambulatory Visit: Payer: Self-pay | Attending: Family Medicine | Admitting: Pharmacist

## 2020-05-23 ENCOUNTER — Encounter: Payer: Self-pay | Admitting: Pharmacist

## 2020-05-23 ENCOUNTER — Other Ambulatory Visit: Payer: Self-pay | Admitting: Internal Medicine

## 2020-05-23 VITALS — BP 172/105

## 2020-05-23 DIAGNOSIS — E11628 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 complications: Secondary | ICD-10-CM

## 2020-05-23 DIAGNOSIS — I10 Essential (primary) hypertension: Secondary | ICD-10-CM

## 2020-05-23 MED ORDER — LOSARTAN POTASSIUM-HCTZ 100-25 MG PO TABS: 1.0000 | ORAL_TABLET | Freq: Every day | ORAL | 2 refills | Status: DC

## 2020-05-23 MED ORDER — LANTUS SOLOSTAR 100 UNIT/ML ~~LOC~~ SOPN: PEN_INJECTOR | SUBCUTANEOUS | 2 refills | Status: DC

## 2020-05-23 MED FILL — LOSARTAN-HCTZ 100-25 MG TAB: 100-25 | 30 days supply | Qty: 30 | Fill #0

## 2020-05-23 NOTE — Progress Notes (Signed)
    S:    PCP: None assigned   No chief complaint on file.  Patient arrives in good spirits. Presents for diabetes evaluation, education, and management. Patient was referred on 04/17/20 by Levada Dy. I saw him on 05/13/20. I added amlodipine and losartan; d/c'd lisinopril. Insulin was changed to Lantus daily and metformin was added.  Today, pt endorses BLE swelling with amlodipine. He has not other complaints.   Family/Social History:  - FHx: DM  - Tobacco: current 0.5 PPD smoker  - Alcohol: denies current use   Insurance coverage/medication affordability: self pay  Medication adherence:   Current diabetes medications include: -  Lantus 50 units daily  Current hypertension medications include:  - losartan 50 mg daily  - amlodipine 5 mg daily  Current hyperlipidemia medications include: none  Patient denies hypoglycemic events.  Patient reported dietary habits:  - Admits to dietary indiscretion  Patient-reported exercise habits: none    Patient denies nocturia (nighttime urination).  Patient reports neuropathy (nerve pain). Patient reports visual changes. Patient reports self foot exams.     O:  Lab Results  Component Value Date   HGBA1C 11.1 (H) 03/24/2020   Vitals:   05/23/20 1129  BP: (!) 172/105    Lipid Panel  No results found for: CHOL, TRIG, HDL, CHOLHDL, VLDL, LDLCALC, LDLDIRECT  Home fasting blood sugars: - Does not have his meter. - Reports fasting CBGs are high 190s - low 200s  Clinical Atherosclerotic Cardiovascular Disease (ASCVD): No  The ASCVD Risk score Mikey Bussing DC Jr., et al., 2013) failed to calculate for the following reasons:   Cannot find a previous HDL lab   Cannot find a previous total cholesterol lab    A/P: Diabetes longstanding currently uncontrolled, but home CBGs seem to be improving. Patient is able to verbalize appropriate hypoglycemia management plan. Medication adherence appears to be optimal. Control is suboptimal due to  physical inactivity and dietary indiscretion. -Inc Lantus to 56 units daily. May increase to 60 units daily after 3 days if home fasting CBGs are not <130. -Continue metformin at current dose for now; checking BMP today. Will titrate to goal dose if renal function wnl.  -Extensively discussed pathophysiology of diabetes, recommended lifestyle interventions, dietary effects on blood sugar control -Counseled on s/sx of and management of hypoglycemia -Next A1C anticipated 06/2020.   Hypertension longstanding currently uncontrolled.  Blood pressure goal = <130/80 mmHg. Medication adherence reported. Will stop amlodipine d/t edema. Due to significant BP elevated, will stop single agent losartan and start losartan-HCTZ combination.  - Stop losartan, amlodipine - Start Hyzaar 100-25 mg daily - BMP  ASCVD risk - primary prevention in patient with diabetes. Needs lipid panel and at least moderate intensity statin therapy. Will address next week at f/u.   -Lipid panel  Written patient instructions provided.  Total time in face to face counseling 30 minutes.   Follow up Pharmacist Clinic Visit 06/17/2020 with Dr. Joya Gaskins.   Benard Halsted, PharmD, [place] [phone]

## 2020-05-23 NOTE — Patient Instructions (Addendum)
Thank you for coming to see me today. Please do the following:  1. Increase Lantus to 56 units daily. If your blood sugar does not fall to 130 after 3 days, increase to 60 units.  2. Stop amlodipine.  3. Stop losartan.  4. Start losartan-HCTZ 100-25 mg. Take 1 tablet daily.  5. Continue checking blood sugars at home. 6. Start checking blood pressure. 7. Continue making the lifestyle changes we've discussed together during our visit. Diet and exercise play a significant role in improving your blood sugars.  8. Follow-up with the provider 06/17/2020.    Hypoglycemia or low blood sugar:   Low blood sugar can happen quickly and may become an emergency if not treated right away.   While this shouldn't happen often, it can be brought upon if you skip a meal or do not eat enough. Also, if your insulin or other diabetes medications are dosed too high, this can cause your blood sugar to go to low.   Warning signs of low blood sugar include: 1. Feeling shaky or dizzy 2. Feeling weak or tired  3. Excessive hunger 4. Feeling anxious or upset  5. Sweating even when you aren't exercising  What to do if I experience low blood sugar? 1. Check your blood sugar with your meter. If lower than 70, proceed to step 2.  2. Treat with 3-4 glucose tablets or 3 packets of regular sugar. If these aren't around, you can try hard candy. Yet another option would be to drink 4 ounces of fruit juice or 6 ounces of REGULAR soda.  3. Re-check your sugar in 15 minutes. If it is still below 70, do what you did in step 2 again. If has come back up, go ahead and eat a snack or small meal at this time.

## 2020-05-24 LAB — BASIC METABOLIC PANEL
BUN/Creatinine Ratio: 11 (ref 9–20)
BUN: 14 mg/dL (ref 6–24)
CO2: 23 mmol/L (ref 20–29)
Calcium: 8.4 mg/dL — ABNORMAL LOW (ref 8.7–10.2)
Chloride: 105 mmol/L (ref 96–106)
Creatinine, Ser: 1.29 mg/dL — ABNORMAL HIGH (ref 0.76–1.27)
GFR calc Af Amer: 76 mL/min/{1.73_m2} (ref >59)
GFR calc non Af Amer: 66 mL/min/{1.73_m2} (ref >59)
Glucose: 239 mg/dL — ABNORMAL HIGH (ref 65–99)
Potassium: 4.2 mmol/L (ref 3.5–5.2)
Sodium: 140 mmol/L (ref 134–144)

## 2020-05-24 LAB — LIPID PANEL
Chol/HDL Ratio: 3.1 ratio (ref 0.0–5.0)
Cholesterol, Total: 132 mg/dL (ref 100–199)
HDL: 42 mg/dL (ref >39)
LDL Chol Calc (NIH): 77 mg/dL (ref 0–99)
Triglycerides: 60 mg/dL (ref 0–149)
VLDL Cholesterol Cal: 13 mg/dL (ref 5–40)

## 2020-05-26 ENCOUNTER — Other Ambulatory Visit: Payer: Self-pay

## 2020-05-26 ENCOUNTER — Telehealth: Payer: Self-pay

## 2020-05-26 ENCOUNTER — Encounter: Payer: Self-pay | Admitting: Internal Medicine

## 2020-05-26 ENCOUNTER — Ambulatory Visit (AMBULATORY_SURGERY_CENTER): Payer: Self-pay | Admitting: Internal Medicine

## 2020-05-26 VITALS — BP 108/75 | HR 78 | Temp 97.7°F | Resp 13 | Ht 70.0 in | Wt 206.0 lb

## 2020-05-26 DIAGNOSIS — D649 Anemia, unspecified: Secondary | ICD-10-CM

## 2020-05-26 DIAGNOSIS — Z538 Procedure and treatment not carried out for other reasons: Secondary | ICD-10-CM

## 2020-05-26 DIAGNOSIS — Z1211 Encounter for screening for malignant neoplasm of colon: Secondary | ICD-10-CM

## 2020-05-26 LAB — SARS CORONAVIRUS 2 (TAT 6-24 HRS): SARS Coronavirus 2: NEGATIVE

## 2020-05-26 MED ORDER — SODIUM CHLORIDE 0.9 % IV SOLN: 500.0000 mL | Freq: Once | INTRAVENOUS | Status: DC

## 2020-05-26 NOTE — Progress Notes (Signed)
Patient needs to be rescheduled for colonoscopy sometime in January, needs to have his knee looked at per Dr. Carlean Purl, that is more pressing.

## 2020-05-26 NOTE — Patient Instructions (Addendum)
I did not find any problems with the stomach.  The colon was not cleaned out well enough - sorry to say.  We will need to reschedule you for that with a different prep.  We should give you time to heal from your injury as well. If you have not had that checked by a doctor sounds like you should.  I appreciate the opportunity to care for you. Gatha Mayer, MD, FACG  YOU HAD AN ENDOSCOPIC PROCEDURE TODAY AT [facility]:   Refer to the procedure report that was given to you for any specific questions about what was found during the examination.  If the procedure report does not answer your questions, please call your gastroenterologist to clarify.  If you requested that your care partner not be given the details of your procedure findings, then the procedure report has been included in a sealed envelope for you to review at your convenience later.  YOU SHOULD EXPECT: Some feelings of bloating in the abdomen. Passage of more gas than usual.  Walking can help get rid of the air that was put into your GI tract during the procedure and reduce the bloating. If you had a lower endoscopy (such as a colonoscopy or flexible sigmoidoscopy) you may notice spotting of blood in your stool or on the toilet paper. If you underwent a bowel prep for your procedure, you may not have a normal bowel movement for a few days.  Please Note:  You might notice some irritation and congestion in your nose or some drainage.  This is from the oxygen used during your procedure.  There is no need for concern and it should clear up in a day or so.  SYMPTOMS TO REPORT IMMEDIATELY:   Following lower endoscopy (colonoscopy or flexible sigmoidoscopy):  Excessive amounts of blood in the stool  Significant tenderness or worsening of abdominal pains  Swelling of the abdomen that is new, acute  Fever of 100F or higher   Following upper endoscopy (EGD)  Vomiting of blood or coffee ground material  New chest  pain or pain under the shoulder blades  Painful or persistently difficult swallowing  New shortness of breath  Fever of 100F or higher  Black, tarry-looking stools  For urgent or emergent issues, a gastroenterologist can be reached at any hour by calling [phone]. Do not use MyChart messaging for urgent concerns.    DIET:  We do recommend a small meal at first, but then you may proceed to your regular diet.  Drink plenty of fluids but you should avoid alcoholic beverages for 24 hours.  ACTIVITY:  You should plan to take it easy for the rest of today and you should NOT DRIVE or use heavy machinery until tomorrow (because of the sedation medicines used during the test).    FOLLOW UP: Our staff will call the number listed on your records 48-72 hours following your procedure to check on you and address any questions or concerns that you may have regarding the information given to you following your procedure. If we do not reach you, we will leave a message.  We will attempt to reach you two times.  During this call, we will ask if you have developed any symptoms of COVID 19. If you develop any symptoms (ie: fever, flu-like symptoms, shortness of breath, cough etc.) before then, please call [phone].  If you test positive for Covid 19 in the 2 weeks post procedure, please call and report this  information to [place].    If any biopsies were taken you will be contacted by phone or by letter within the next 1-3 weeks.  Please call us at [phone] if you have not heard about the biopsies in 3 weeks.    SIGNATURES/CONFIDENTIALITY: You and/or your care partner have signed paperwork which will be entered into your electronic medical record.  These signatures attest to the fact that that the information above on your After Visit Summary has been reviewed and is understood.  Full responsibility of the confidentiality of this discharge information lies with you and/or your care-partner.

## 2020-05-26 NOTE — Telephone Encounter (Signed)
Call placed to pt and there is no voicemail set up to leave a message.

## 2020-05-26 NOTE — Telephone Encounter (Signed)
-----   Message from [name], MD sent at 05/25/2020  1:39 PM EST ----- Glucose is elevated. Please advise to continue with regimen discussed at his last visit. Cholesterol is normal. Needs to follow up with PCP to discuss need for Cholesterol medications in diabetics

## 2020-05-26 NOTE — Op Note (Signed)
[place] Patient Name: Todd Mccoy Procedure Date: 05/26/2020 3:51 PM MRN: [id number] Endoscopist: Gatha Mayer , MD Age: 49 Referring MD:  Date of Birth: 05/05/1971 Gender: Male Account #: [id number] Procedure:                Colonoscopy Indications:              Screening for colorectal malignant neoplasm Medicines:                Monitored Anesthesia Care Procedure:                Pre-Anesthesia Assessment:                           - Prior to the procedure, a History and Physical                            was performed, and patient medications and                            allergies were reviewed. The patient's tolerance of                            previous anesthesia was also reviewed. The risks                            and benefits of the procedure and the sedation                            options and risks were discussed with the patient.                            All questions were answered, and informed consent                            was obtained. Prior Anticoagulants: The patient has                            taken no previous anticoagulant or antiplatelet                            agents. ASA Grade Assessment: III - A patient with                            severe systemic disease. After reviewing the risks                            and benefits, the patient was deemed in                            satisfactory condition to undergo the procedure.                           After obtaining informed consent, the colonoscope  was passed under direct vision. Throughout the                            procedure, the patient's blood pressure, pulse, and                            oxygen saturations were monitored continuously. The                            Colonoscope was introduced through the anus with                            the intention of advancing to the cecum. The scope                            was  advanced to the sigmoid colon before the                            procedure was aborted. Medications were given. The                            colonoscopy was performed with difficulty due to                            poor endoscopic visualization. The patient                            tolerated the procedure well. The quality of the                            bowel preparation was poor. The bowel preparation                            used was Miralax via split dose instruction. Scope In: 4:15:53 PM Scope Out: 4:16:51 PM Total Procedure Duration: 0 hours 0 minutes 58 seconds  Findings:                 The perianal and digital rectal examinations were                            normal.                           Semi-solid stool was found in the rectum and in the                            sigmoid colon, interfering with visualization. Complications:            No immediate complications. Estimated Blood Loss:     Estimated blood loss: none. Impression:               - Preparation of the colon was poor.                           - Stool in the rectum  and in the sigmoid colon. It                            was thick and adherent and too much to think lavage                            would clear so aborted.                           - No specimens collected. Recommendation:           - Patient has a contact number available for                            emergencies. The signs and symptoms of potential                            delayed complications were discussed with the                            patient. Return to normal activities tomorrow.                            Written discharge instructions were provided to the                            patient.                           - Resume previous diet.                           - Continue present medications.                           - Repeat colonoscopy at next available appointment                            (within 3  months). Extra/double prep Gatha Mayer, MD 05/26/2020 4:33:31 PM This report has been signed electronically.

## 2020-05-26 NOTE — Progress Notes (Signed)
A and O x3. Report to RN. Tolerated MAC anesthesia well.Teeth unchanged after procedure.

## 2020-05-26 NOTE — Op Note (Signed)
[place] Patient Name: Todd Mccoy Procedure Date: 05/26/2020 3:53 PM MRN: [id number] Endoscopist: Gatha Mayer , MD Age: 49 Referring MD:  Date of Birth: [birth year] Gender: Male Account #: [id number] Procedure:                Upper GI endoscopy Indications:              Anemia Medicines:                Propofol per Anesthesia, Monitored Anesthesia Care Procedure:                Pre-Anesthesia Assessment:                           - Prior to the procedure, a History and Physical                            was performed, and patient medications and                            allergies were reviewed. The patient's tolerance of                            previous anesthesia was also reviewed. The risks                            and benefits of the procedure and the sedation                            options and risks were discussed with the patient.                            All questions were answered, and informed consent                            was obtained. Prior Anticoagulants: The patient has                            taken no previous anticoagulant or antiplatelet                            agents. ASA Grade Assessment: III - A patient with                            severe systemic disease. After reviewing the risks                            and benefits, the patient was deemed in                            satisfactory condition to undergo the procedure.                           After obtaining informed consent, the endoscope was  passed under direct vision. Throughout the                            procedure, the patient's blood pressure, pulse, and                            oxygen saturations were monitored continuously. The                            Endoscope was introduced through the mouth, and                            advanced to the second part of duodenum. The upper                            GI endoscopy was  somewhat difficult due to unusual                            anatomy. The patient tolerated the procedure well. Scope In: Scope Out: Findings:                 The examined esophagus was normal.                           Bilious fluid was found in the gastric body.                            Aspirated, moderate amount - no solid food..                           The cardia and gastric fundus were normal on                            retroflexion but unable to position the scope to                            see GE junction / elongated stomach..                           The exam was otherwise without abnormality. Complications:            No immediate complications. Estimated Blood Loss:     Estimated blood loss: none. Impression:               - Normal esophagus.                           - Bilious gastric fluid.                           - The examination was otherwise normal though could                            not see GE junction in retroflexion..                           -  No specimens collected. Recommendation:           - Patient has a contact number available for                            emergencies. The signs and symptoms of potential                            delayed complications were discussed with the                            patient. Return to normal activities tomorrow.                            Written discharge instructions were provided to the                            patient.                           - Resume previous diet.                           - Continue present medications.                           - See the other procedure note for documentation of                            additional recommendations. Gatha Mayer, MD 05/26/2020 4:30:45 PM This report has been signed electronically.

## 2020-05-26 NOTE — Progress Notes (Signed)
Pt's states no medical or surgical changes since previsit or office visit.  HC IV. BP 180/114 in left arm and 178/116 in the right arm. Dr. Carlean Purl notified labetolol ordered.  1527 gave 5 mg of IV labetolol. 155/106  1536 5 mg IV labetolol given. 150/102  1543 5mg  IV labetolol given.  148/98.  Report given to CRNA

## 2020-05-26 NOTE — Progress Notes (Signed)
Lidocaine buffer  robinol antisialogogue 

## 2020-05-27 ENCOUNTER — Telehealth: Payer: Self-pay | Admitting: *Deleted

## 2020-05-27 NOTE — Telephone Encounter (Signed)
  Follow up Call-  Call back number 05/26/2020  Post procedure Call Back phone  # [phone]  Permission to leave phone message Yes  Some recent data might be hidden    Voicemail not set up

## 2020-05-27 NOTE — Telephone Encounter (Signed)
Mailbox full on f/u call  

## 2020-05-28 ENCOUNTER — Telehealth: Payer: Self-pay

## 2020-05-28 NOTE — Telephone Encounter (Signed)
-----   Message from [name], MD sent at 05/25/2020  1:39 PM EST ----- Glucose is elevated. Please advise to continue with regimen discussed at his last visit. Cholesterol is normal. Needs to follow up with PCP to discuss need for Cholesterol medications in diabetics

## 2020-05-28 NOTE — Telephone Encounter (Signed)
Mailbox is currently full, letter will be mailed to pt.

## 2020-06-16 MED FILL — !LANTUS SOLOSTAR 100UNITS/M: 100 | 25 days supply | Qty: 15 | Fill #0

## 2020-06-16 MED FILL — LOSARTAN-HCTZ 100-25 MG TAB: 100-25 | 30 days supply | Qty: 30 | Fill #1

## 2020-06-16 MED FILL — metFORMIN HCL ER 500 MG TB2: 500 | 30 days supply | Qty: 60 | Fill #1

## 2020-06-17 ENCOUNTER — Ambulatory Visit: Payer: Self-pay | Attending: Critical Care Medicine | Admitting: Critical Care Medicine

## 2020-06-17 ENCOUNTER — Other Ambulatory Visit: Payer: Self-pay

## 2020-06-17 NOTE — Progress Notes (Unsigned)
   Subjective:    Patient ID: Todd Mccoy, male    DOB: [birth year], 49 y.o.   MRN: [id number]  49 y.o.M here to est PCP  Hx of T2DM, toe/gangrene of R foot, abscess of lower back, tobacco use, scrotal abscess, nec fasciitis  Saw PA in October, then [place] for T2DM care twice.  Last OV 05/23/20 with Clin Pharm: Diabetes longstanding currently uncontrolled, but home CBGs seem to be improving. Patient is able to verbalize appropriate hypoglycemia management plan. Medication adherence appears to be optimal. Control is suboptimal due to physical inactivity and dietary indiscretion. -Inc Lantus to 56 units daily. May increase to 60 units daily after 3 days if home fasting CBGs are not <130. -Continue metformin at current dose for now; checking BMP today. Will titrate to goal dose if renal function wnl.  -Extensively discussed pathophysiology of diabetes, recommended lifestyle interventions, dietary effects on blood sugar control -Counseled on s/sx of and management of hypoglycemia -Next A1C anticipated 06/2020.   Hypertension longstanding currently uncontrolled.  Blood pressure goal = <130/80 mmHg. Medication adherence reported. Will stop amlodipine d/t edema. Due to significant BP elevated, will stop single agent losartan and start losartan-HCTZ combination.  - Stop losartan, amlodipine - Start Hyzaar 100-25 mg daily - BMP  ASCVD risk - primary prevention in patient with diabetes. Needs lipid panel and at least moderate intensity statin therapy. Will address next week at f/u.   -Lipid panel       Review of Systems     Objective:   Physical Exam        Assessment & Plan:

## 2020-06-19 ENCOUNTER — Telehealth: Payer: Self-pay | Admitting: General Practice

## 2020-06-19 NOTE — Telephone Encounter (Signed)
Copied from [place] [phone]. Topic: General - Other >> Jun 19, 2020  3:52 PM Leward Quan A wrote: Reason for CRM: Patient called in in reference to his missed virtual visit would like reschedule but first available is 08/12/19. Please contact patient at Ph# [phone]   Please let me know if I can double book a slot for Dr. Joya Gaskins to get patient in sooner than 08/12/2019. If so I will contact the patient and schedule.

## 2020-06-20 NOTE — Telephone Encounter (Signed)
Called patient and LVM to reschedule his missed virtual visit with Dr. Joya Gaskins.

## 2020-07-15 ENCOUNTER — Encounter (HOSPITAL_COMMUNITY): Payer: Self-pay

## 2020-07-15 ENCOUNTER — Emergency Department (HOSPITAL_COMMUNITY)
Admission: EM | Admit: 2020-07-15 | Discharge: 2020-07-15 | Disposition: A | Discharge status: Home / Self Care | Payer: Self-pay | Attending: Emergency Medicine | Admitting: Emergency Medicine

## 2020-07-15 ENCOUNTER — Other Ambulatory Visit: Payer: Self-pay

## 2020-07-15 ENCOUNTER — Emergency Department (HOSPITAL_COMMUNITY): Payer: Self-pay

## 2020-07-15 DIAGNOSIS — Z794 Long term (current) use of insulin: Secondary | ICD-10-CM | POA: Insufficient documentation

## 2020-07-15 DIAGNOSIS — M869 Osteomyelitis, unspecified: Secondary | ICD-10-CM | POA: Insufficient documentation

## 2020-07-15 DIAGNOSIS — E11628 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 complications: Secondary | ICD-10-CM | POA: Insufficient documentation

## 2020-07-15 DIAGNOSIS — W1839XA Other fall on same level, initial encounter: Secondary | ICD-10-CM | POA: Insufficient documentation

## 2020-07-15 DIAGNOSIS — M25561 Pain in right knee: Secondary | ICD-10-CM | POA: Insufficient documentation

## 2020-07-15 DIAGNOSIS — E1140 Type 2 diabetes mellitus with diabetic neuropathy, unspecified: Secondary | ICD-10-CM | POA: Insufficient documentation

## 2020-07-15 DIAGNOSIS — Z79899 Other long term (current) drug therapy: Secondary | ICD-10-CM | POA: Insufficient documentation

## 2020-07-15 DIAGNOSIS — I10 Essential (primary) hypertension: Secondary | ICD-10-CM | POA: Insufficient documentation

## 2020-07-15 DIAGNOSIS — E11319 Type 2 diabetes mellitus with unspecified diabetic retinopathy without macular edema: Secondary | ICD-10-CM | POA: Insufficient documentation

## 2020-07-15 DIAGNOSIS — Z7984 Long term (current) use of oral hypoglycemic drugs: Secondary | ICD-10-CM | POA: Insufficient documentation

## 2020-07-15 DIAGNOSIS — S82102A Unspecified fracture of upper end of left tibia, initial encounter for closed fracture: Secondary | ICD-10-CM | POA: Insufficient documentation

## 2020-07-15 DIAGNOSIS — E1152 Type 2 diabetes mellitus with diabetic peripheral angiopathy with gangrene: Secondary | ICD-10-CM | POA: Insufficient documentation

## 2020-07-15 DIAGNOSIS — F17210 Nicotine dependence, cigarettes, uncomplicated: Secondary | ICD-10-CM | POA: Insufficient documentation

## 2020-07-15 MED ORDER — HYDROCODONE-ACETAMINOPHEN 5-325 MG PO TABS
1.0000 | ORAL_TABLET | Freq: Once | ORAL | Status: AC
  Administered 2020-07-15: 1 via ORAL
  Filled 2020-07-15: qty 1

## 2020-07-15 MED ORDER — HYDROCODONE-ACETAMINOPHEN 5-325 MG PO TABS: 1.0000 | ORAL_TABLET | ORAL | 0 refills | Status: DC | PRN

## 2020-07-15 NOTE — Discharge Instructions (Signed)
Wear the knee brace for comfort.  Call the orthopedic doctor for further care and treatment as needed.

## 2020-07-15 NOTE — Progress Notes (Signed)
Orthopedic Tech Progress Note Patient Details:  Todd Mccoy 06/14/1971 [id number]  Ortho Devices Ortho Device/Splint Location: applied knee immobilizer to LLE Ortho Device/Splint Interventions: Ordered,Application   Post Interventions Patient Tolerated: Well Instructions Provided: Care of device   Braulio Bosch 07/15/2020, 3:46 PM

## 2020-07-15 NOTE — ED Provider Notes (Signed)
[facility] DEPT Provider Note   CSN: [id number] Arrival date & time: 07/15/20  1250     History Chief Complaint  Patient presents with  . Knee Pain    Todd Mccoy is a 50 y.o. male.  HPI He presents for evaluation of right knee pain, present for 1 month after a fall.  He states he injured his left knee.  He feels like he hyperextended it when he fell.  He has been getting around using crutches.  He also injured his left ankle, when he fell.  He denies other problems at this time.  There are no other known modifying factors.    Past Medical History:  Diagnosis Date  . Diabetes mellitus without complication ([place])   . Diabetic neuropathy ([place])   . 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[place]) 03/25/2020   pt states retinal eye damage- left worse than the right- recent visited MD   . 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[place]) 03/25/2020   pt states recent MD visit /left eye worse than right  . Hypertension     Patient Active Problem List   Diagnosis Date Noted  . Osteomyelitis of second toe of right foot ([place])   . 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 Cellulitis and abscess of foot   . Toe osteomyelitis ([place]) 03/24/2020  . Necrotizing fasciitis ([place]) 09/08/2014  . Diabetes mellitus with hyperglycemia ([place]) 09/07/2014  . Tobacco abuse 09/07/2014  . Abscess of back   . Abscess of lower back 09/06/2014  . DM2 (diabetes mellitus, type 2) ([place]) 09/06/2014  . Sepsis ([place]) 09/06/2014  . Scrotal abscess 06/20/2014    Past Surgical History:  Procedure Laterality Date  . ABSCESS DRAINAGE     neck  . AMPUTATION TOE Right 03/25/2020   Procedure: AMPUTATION TOE;  Surgeon: Evelina Bucy, DPM;  Location: [facility];  Service: Podiatry;  Laterality: Right;  . INCISION AND DRAINAGE ABSCESS Right 09/07/2014   Procedure: INCISION AND DRAINAGE ABSCESS RIGHT FLANK;  Surgeon: Jackolyn Confer, MD;  Location: [facility];  Service: General;  Laterality:  Right;  . SCROTUM EXPLORATION         Family History  Problem Relation Age of Onset  . Diabetes Mother   . Diabetes Father   . Diabetes Brother   . Colon cancer Neg Hx   . Esophageal cancer Neg Hx   . Pancreatic cancer Neg Hx   . Stomach cancer Neg Hx   . Liver disease Neg Hx     Social History   Tobacco Use  . Smoking status: Current Every Day Smoker    Packs/day: 0.50    Types: Cigarettes  . Smokeless tobacco: Never Used  Substance Use Topics  . Alcohol use: No  . Drug use: No    Home Medications Prior to Admission medications   Medication Sig Start Date End Date Taking? Authorizing Provider  HYDROcodone-acetaminophen (NORCO/VICODIN) 5-325 MG tablet Take 1 tablet by mouth every 4 (four) hours as needed for moderate pain or severe pain. 07/15/20  Yes Daleen Bo, MD  acetaminophen (TYLENOL) 325 MG tablet Take 2 tablets (650 mg total) by mouth every 6 (six) hours as needed for mild pain or fever. 03/27/20   Hongalgi, Lenis Dickinson, MD  insulin glargine (LANTUS SOLOSTAR) 100 UNIT/ML Solostar Pen Inject 56 units daily subq. May increase to 60 units after 3 days if fasting CBGs >130. 05/23/20   [name], MD  Insulin Pen Needle (TRUEPLUS  5-BEVEL PEN NEEDLES) 32G X 4 MM MISC Use to inject insulin daily. 05/13/20   [name], MD  Insulin Syringe-Needle U-100 (SAFETY INSULIN SYRINGES) 30G X 1/2" 1 ML MISC 35 Units by Does not apply route 2 (two) times daily before a meal. 04/17/20   McClung, Dionne Bucy, PA-C  losartan-hydrochlorothiazide (HYZAAR) 100-25 MG tablet Take 1 tablet by mouth daily. 05/23/20   [name], MD  metFORMIN (GLUCOPHAGE-XR) 500 MG 24 hr tablet Take 1 tablet (500 mg total) by mouth 2 (two) times daily. 05/13/20   [name], MD  vitamin B-12 (CYANOCOBALAMIN) 500 MCG tablet Take 500 mcg by mouth daily.    [provider]    Allergies    Amlodipine  Review of Systems   Review of Systems  All other systems reviewed and are  negative.   Physical Exam Updated Vital Signs BP (!) 167/102 (BP Location: Left Arm)   Pulse (!) 108   Temp 98 F (36.7 C) (Oral)   Resp 16   SpO2 99%   Physical Exam Vitals and nursing note reviewed.  Constitutional:      General: He is not in acute distress.    Appearance: He is well-developed and well-nourished. He is not ill-appearing, toxic-appearing or diaphoretic.  HENT:     Head: Normocephalic and atraumatic.     Right Ear: External ear normal.     Left Ear: External ear normal.  Eyes:     Extraocular Movements: EOM normal.     Conjunctiva/sclera: Conjunctivae normal.     Pupils: Pupils are equal, round, and reactive to light.  Neck:     Trachea: Phonation normal.  Cardiovascular:     Rate and Rhythm: Normal rate.  Pulmonary:     Effort: Pulmonary effort is normal.  Chest:     Chest wall: No bony tenderness.  Abdominal:     General: There is no distension.  Musculoskeletal:     Cervical back: Normal range of motion and neck supple.     Comments: Guards against movement of the left knee secondary to pain.  No significant swelling of the left knee.  Mild left ankle tenderness, without deformity.  Skin:    General: Skin is warm, dry and intact.  Neurological:     Mental Status: He is alert and oriented to person, place, and time.     Cranial Nerves: No cranial nerve deficit.     Sensory: No sensory deficit.     Motor: No abnormal muscle tone.     Coordination: Coordination normal.  Psychiatric:        Mood and Affect: Mood and affect and mood normal.        Behavior: Behavior normal.        Thought Content: Thought content normal.        Judgment: Judgment normal.     ED Results / Procedures / Treatments   Labs (all labs ordered are listed, but only abnormal results are displayed) Labs Reviewed - No data to display  EKG None  Radiology DG Ankle Complete Left  Result Date: 07/15/2020 CLINICAL DATA:  Hyperextension injury of knee 3 weeks ago, pain,  has been using crutches at home EXAM: LEFT ANKLE COMPLETE - 3+ VIEW COMPARISON:  None FINDINGS: Two screws at medial malleolus post ORIF. Bones appear demineralized. Diffuse soft tissue swelling of lower leg, ankle, and foot. Joint spaces preserved. No acute fracture, dislocation or bone destruction. IMPRESSION: No acute osseous abnormalities. Prior medial malleolar ORIF. Electronically Signed  By: Lavonia Dana M.D.   On: 07/15/2020 15:00   DG Knee Complete 4 Views Left  Result Date: 07/15/2020 CLINICAL DATA:  LEFT knee pain post injury, hyper extended knee 3 weeks ago, has been using crutches at home EXAM: LEFT KNEE - COMPLETE 4+ VIEW COMPARISON:  None FINDINGS: Mild osseous demineralization. Subacute depressed fracture of the medial LEFT tibial plateau, with indistinct fracture margins and evidence of bony resorption. Minimal sclerosis at the tibial metaphysis. Fracture extends into the tibial spines and intra-articular at the medial compartment. Associated soft tissue swelling and joint effusion. No additional fracture, dislocation, or bone destruction. IMPRESSION: Subacute depressed fracture of the medial LEFT tibial plateau which extends intra-articular at the medial compartment as well as extending into the tibial spines. Associated soft tissue swelling and joint effusion. Consider further assessment by CT. Electronically Signed   By: Lavonia Dana M.D.   On: 07/15/2020 14:54    Procedures Procedures (including critical care time)  Medications Ordered in ED Medications  HYDROcodone-acetaminophen (NORCO/VICODIN) 5-325 MG per tablet 1 tablet (has no administration in time range)    ED Course  I have reviewed the triage vital signs and the nursing notes.  Pertinent labs & imaging results that were available during my care of the patient were reviewed by me and considered in my medical decision making (see chart for details).    MDM Rules/Calculators/A&P                           Patient  Vitals for the past 24 hrs:  BP Temp Temp src Pulse Resp SpO2  07/15/20 1312 (!) 167/102 98 F (36.7 C) Oral (!) 108 16 99 %    3:35 PM Reevaluation with update and discussion. After initial assessment and treatment, an updated evaluation reveals no further complaints, findings discussed and questions answered. Daleen Bo   Medical Decision Making:  This patient is presenting for evaluation of left knee and ankle pain after fall, which does require a range of treatment options, and is a complaint that involves a moderate risk of morbidity and mortality. The differential diagnoses include fracture, contusion, sprain. I decided to review old records, and in summary middle-aged male, fell 1 month ago, has had persistent left knee and ankle pain since.  I did not require additional historical information from anyone.   Radiologic Tests Ordered, included radiography left ankle and left knee.  I independently Visualized: Radiographic images, which show depressed medial tibial plateau fracture, without dislocation or displacement.  Normal left ankle x-ray   Critical Interventions-clinical evaluation, radiography, observation and reassessment.  Knee immobilizer ordered  After These Interventions, the Patient was reevaluated and was found stable for discharge.  Subacute injury left knee, with fracture, but does not require acute surgical intervention.  Patient has been having pain like this for 4 weeks, since the injury.  No significant left ankle injury.  CRITICAL CARE-no Performed by: Daleen Bo  Nursing Notes Reviewed/ Care Coordinated Applicable Imaging Reviewed Interpretation of Laboratory Data incorporated into ED treatment  The patient appears reasonably screened and/or stabilized for discharge and I doubt any other medical condition or other [facility] requiring further screening, evaluation, or treatment in the ED at this time prior to discharge.  Plan: Home Medications-continue usual;  Home Treatments-rest, fluids, knee immobilizer as needed; return here if the recommended treatment, does not improve the symptoms; Recommended follow up-orthopedic follow-up 1 week and as needed.     Final Clinical  Impression(s) / ED Diagnoses Final diagnoses:  Closed fracture of proximal end of left tibia, unspecified fracture morphology, initial encounter    Rx / DC Orders ED Discharge Orders         Ordered    HYDROcodone-acetaminophen (NORCO/VICODIN) 5-325 MG tablet  Every 4 hours PRN        07/15/20 1527           Daleen Bo, MD 07/15/20 1535

## 2020-07-15 NOTE — ED Triage Notes (Signed)
Pt presents with c/o left knee pain/injury. Pt reports he hyperextended his knee approx 3 week ago. Pt had some crutches at home and has been using them.

## 2020-10-14 ENCOUNTER — Other Ambulatory Visit: Payer: Self-pay

## 2020-10-14 MED FILL — Insulin Pen Needle 32 G X 4 MM (1/6" or 5/32"): Qty: 100 | Fill #0 | Status: CN

## 2020-10-15 ENCOUNTER — Other Ambulatory Visit: Payer: Self-pay

## 2020-10-15 MED FILL — Losartan Potassium & Hydrochlorothiazide Tab 100-25 MG: ORAL | 30 days supply | Qty: 30 | Fill #0 | Status: CN

## 2020-10-15 MED FILL — Insulin Glargine Soln Pen-Injector 100 Unit/ML: SUBCUTANEOUS | 25 days supply | Qty: 15 | Fill #0 | Status: CN

## 2020-10-15 MED FILL — Metformin HCl Tab ER 24HR 500 MG: ORAL | 30 days supply | Qty: 60 | Fill #0 | Status: CN

## 2020-10-20 ENCOUNTER — Other Ambulatory Visit: Payer: Self-pay

## 2020-10-20 MED FILL — Insulin Pen Needle 32 G X 4 MM (1/6" or 5/32"): 100 days supply | Qty: 100 | Fill #0 | Status: AC

## 2020-10-21 ENCOUNTER — Other Ambulatory Visit: Payer: Self-pay

## 2020-10-22 ENCOUNTER — Ambulatory Visit: Payer: Self-pay | Attending: Family Medicine | Admitting: Family Medicine

## 2020-10-22 ENCOUNTER — Other Ambulatory Visit: Payer: Self-pay

## 2020-10-22 ENCOUNTER — Encounter: Payer: Self-pay | Admitting: Family Medicine

## 2020-10-22 VITALS — BP 148/90 | HR 85 | Resp 18 | Ht 70.0 in | Wt 198.0 lb

## 2020-10-22 DIAGNOSIS — I152 Hypertension secondary to endocrine disorders: Secondary | ICD-10-CM

## 2020-10-22 DIAGNOSIS — E1159 Type 2 diabetes mellitus with other circulatory complications: Secondary | ICD-10-CM

## 2020-10-22 DIAGNOSIS — E1142 Type 2 diabetes mellitus with diabetic polyneuropathy: Secondary | ICD-10-CM

## 2020-10-22 DIAGNOSIS — E11628 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 complications: Secondary | ICD-10-CM

## 2020-10-22 DIAGNOSIS — S82102D Unspecified fracture of upper end of left tibia, subsequent encounter for closed fracture with routine healing: Secondary | ICD-10-CM

## 2020-10-22 LAB — POCT GLYCOSYLATED HEMOGLOBIN (HGB A1C): HbA1c, POC (controlled diabetic range): 8 % — AB (ref 0.0–7.0)

## 2020-10-22 LAB — GLUCOSE, POCT (MANUAL RESULT ENTRY): POC Glucose: 164 mg/dl — AB (ref 70–99)

## 2020-10-22 MED ORDER — METFORMIN HCL ER 500 MG PO TB24
ORAL_TABLET | Freq: Two times a day (BID) | ORAL | 6 refills | Status: DC
  Filled 2020-10-22: qty 60, 30d supply, fill #0

## 2020-10-22 MED ORDER — ATORVASTATIN CALCIUM 20 MG PO TABS
20.0000 mg | ORAL_TABLET | Freq: Every day | ORAL | 3 refills | Status: DC
  Filled 2020-10-22: qty 30, 30d supply, fill #0
  Filled 2020-11-26: qty 30, 30d supply, fill #1

## 2020-10-22 MED ORDER — LOSARTAN POTASSIUM-HCTZ 100-25 MG PO TABS
1.0000 | ORAL_TABLET | Freq: Every day | ORAL | 6 refills | Status: DC
  Filled 2020-10-22: qty 30, 30d supply, fill #0
  Filled 2020-11-26: qty 30, 30d supply, fill #1

## 2020-10-22 MED ORDER — INSULIN GLARGINE 100 UNIT/ML SOLOSTAR PEN
35.0000 [IU] | PEN_INJECTOR | Freq: Every day | SUBCUTANEOUS | 6 refills | Status: DC
  Filled 2020-10-22: qty 9, 25d supply, fill #0

## 2020-10-22 MED ORDER — GABAPENTIN 300 MG PO CAPS
300.0000 mg | ORAL_CAPSULE | Freq: Every day | ORAL | 6 refills | Status: DC
  Filled 2020-10-22: qty 30, 30d supply, fill #0
  Filled 2020-11-26: qty 30, 30d supply, fill #1

## 2020-10-22 NOTE — Progress Notes (Signed)
Subjective:  Patient ID: Todd Mccoy, male    DOB: 1971-05-21  Age: 50 y.o. MRN: [id number]  CC: Establish Care   HPI Todd Mccoy is a 50 year old male with history of type 2 diabetes mellitus (A1c 8.0), diabetic retinopathy, hypertension, left proximal tibia fracture who presents to establish care.  Complains of numbness in his fingers and feet for the last 3-4 months. Also feels both legs are tight and swollen and he denies increased sodium intake. Has been more sedentary since he sustained fracture of his left Tibia and is followed by [facility]  He had R eye retina surgery on 09/03/20 and still has blurry vision in his R; he has lost vision in his left. His A1c is 8.0 down from 11.1 previously. He has been taking 30 units of Lantus rather than 56 units prescribed due to episodes of hypoglycemia. Fasting sugars average 170. He has had no hypoglycemia since he decrease his Lantus dose. Past Medical History:  Diagnosis Date  . Diabetes mellitus without complication ([place])   . Diabetic neuropathy ([place])   . 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[place]) 03/25/2020   pt states retinal eye damage- left worse than the right- recent visited MD   . 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[place]) 03/25/2020   pt states recent MD visit /left eye worse than right  . Hypertension     Past Surgical History:  Procedure Laterality Date  . ABSCESS DRAINAGE     neck  . AMPUTATION TOE Right 03/25/2020   Procedure: AMPUTATION TOE;  Surgeon: Evelina Bucy, DPM;  Location: [facility];  Service: Podiatry;  Laterality: Right;  . INCISION AND DRAINAGE ABSCESS Right 09/07/2014   Procedure: INCISION AND DRAINAGE ABSCESS RIGHT FLANK;  Surgeon: Jackolyn Confer, MD;  Location: [facility];  Service: General;  Laterality: Right;  . SCROTUM EXPLORATION      Family History  Problem Relation Age of Onset  . Diabetes Mother   . Diabetes Father   . Diabetes Brother   . Colon cancer Neg Hx   .  Esophageal cancer Neg Hx   . Pancreatic cancer Neg Hx   . Stomach cancer Neg Hx   . Liver disease Neg Hx     Allergies  Allergen Reactions  . Amlodipine Swelling    BLE edema    Outpatient Medications Prior to Visit  Medication Sig Dispense Refill  . acetaminophen (TYLENOL) 325 MG tablet Take 2 tablets (650 mg total) by mouth every 6 (six) hours as needed for mild pain or fever.    Marland Kitchen HYDROcodone-acetaminophen (NORCO/VICODIN) 5-325 MG tablet Take 1 tablet by mouth every 4 (four) hours as needed for moderate pain or severe pain. 15 tablet 0  . Insulin Pen Needle 32G X 4 MM MISC USE TO INJECT INSULIN DAILY. 100 each 2  . Insulin Syringe-Needle U-100 (SAFETY INSULIN SYRINGES) 30G X 1/2" 1 ML MISC 35 Units by Does not apply route 2 (two) times daily before a meal. 100 each 1  . vitamin B-12 (CYANOCOBALAMIN) 500 MCG tablet Take 500 mcg by mouth daily.    . insulin glargine (LANTUS) 100 UNIT/ML Solostar Pen INJECT 56 UNITS DAILY SUBCUTANEOUSLY. MAY INCREASE TO 60 UNITS AFTER 3 DAYS IF FASTING CBGS >130. 15 mL 2  . losartan-hydrochlorothiazide (HYZAAR) 100-25 MG tablet TAKE 1 TABLET BY MOUTH DAILY. 30 tablet 2  . metFORMIN (GLUCOPHAGE-XR) 500 MG 24 hr tablet TAKE 1 TABLET (500 MG TOTAL) BY MOUTH 2 (TWO) TIMES DAILY.  60 tablet 2   No facility-administered medications prior to visit.     ROS Review of Systems  Constitutional: Negative for activity change and appetite change.  HENT: Negative for sinus pressure and sore throat.   Eyes: Positive for visual disturbance.  Respiratory: Negative for cough, chest tightness and shortness of breath.   Cardiovascular: Negative for chest pain and leg swelling.  Gastrointestinal: Negative for abdominal distention, abdominal pain, constipation and diarrhea.  Endocrine: Negative.   Genitourinary: Negative for dysuria.  Musculoskeletal: Positive for gait problem. Negative for joint swelling and myalgias.  Skin: Negative for rash.  Allergic/Immunologic:  Negative.   Neurological: Positive for numbness. Negative for weakness and light-headedness.  Psychiatric/Behavioral: Negative for dysphoric mood and suicidal ideas.    Objective:  BP (!) 148/90   Pulse 85   Resp 18   Ht '5\' 10"'$  (1.778 m)   Wt 198 lb (89.8 kg)   SpO2 96%   BMI 28.41 kg/m   BP/Weight 10/22/2020 07/15/2020 [id number]  Systolic BP 123456 [id number]  Diastolic BP 90 [id number] 75  Wt. (Lbs) 198 - 206  BMI 28.41 - 29.56      Physical Exam Constitutional:      Appearance: He is well-developed.  Neck:     Vascular: No JVD.  Cardiovascular:     Rate and Rhythm: Normal rate.     Heart sounds: Normal heart sounds. No murmur heard.   Pulmonary:     Effort: Pulmonary effort is normal.     Breath sounds: Normal breath sounds. No wheezing or rales.  Chest:     Chest wall: No tenderness.  Abdominal:     General: Bowel sounds are normal. There is no distension.     Palpations: Abdomen is soft. There is no mass.     Tenderness: There is no abdominal tenderness.  Musculoskeletal:     Right lower leg: No edema.     Left lower leg: No edema.  Neurological:     Mental Status: He is alert and oriented to person, place, and time.     Gait: Gait abnormal.  Psychiatric:        Mood and Affect: Mood normal.     CMP Latest Ref Rng & Units 05/23/2020 04/17/2020 03/27/2020  Glucose 65 - 99 mg/dL 239(H) 279(H) 149(H)  BUN 6 - 24 mg/dL '14 17 16  '$ Creatinine 0.76 - 1.27 mg/dL 1.29(H) 1.33(H) 1.14  Sodium 134 - 144 mmol/L 140 139 132(L)  Potassium 3.5 - 5.2 mmol/L 4.2 3.8 3.8  Chloride 96 - 106 mmol/L 105 104 100  CO2 20 - 29 mmol/L '23 24 24  '$ Calcium 8.7 - 10.2 mg/dL 8.4(L) 7.8(L) 7.8(L)  Total Protein 6.0 - 8.5 g/dL - 5.9(L) -  Total Bilirubin 0.0 - 1.2 mg/dL - 0.5 -  Alkaline Phos 44 - 121 IU/L - 124(H) -  AST 0 - 40 IU/L - 17 -  ALT 0 - 44 IU/L - 15 -    Lipid Panel     Component Value Date/Time   CHOL 132 05/23/2020 1144   TRIG 60 05/23/2020 1144   HDL 42 05/23/2020 1144    CHOLHDL 3.1 05/23/2020 1144   LDLCALC 77 05/23/2020 1144    CBC    Component Value Date/Time   WBC 5.4 05/13/2020 1635   RBC 3.43 (L) 05/13/2020 1635   HGB 9.0 Repeated and verified X2. (L) 05/13/2020 1635   HGB 8.4 (L) 04/17/2020 1716   HCT 27.9 (L) 05/13/2020 1635  HCT 26.3 (L) 04/17/2020 1716   PLT 264.0 05/13/2020 1635   PLT 197 04/17/2020 1716   MCV 81.2 05/13/2020 1635   MCV 85 04/17/2020 1716   MCH 27.1 04/17/2020 1716   MCH 26.4 03/27/2020 0536   MCHC 32.5 05/13/2020 1635   RDW 17.3 (H) 05/13/2020 1635   RDW 14.8 04/17/2020 1716   LYMPHSABS 1.5 05/13/2020 1635   LYMPHSABS 1.9 04/17/2020 1716   MONOABS 0.5 05/13/2020 1635   EOSABS 0.1 05/13/2020 1635   EOSABS 0.1 04/17/2020 1716   BASOSABS 0.0 05/13/2020 1635   BASOSABS 0.0 04/17/2020 1716    Lab Results  Component Value Date   HGBA1C 8.0 (A) 10/22/2020    Assessment & Plan:  1. 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 complication, unspecified whether long term insulin use (HCC) Uncontrolled with A1c of 8.0 which is down from 11.1 previously. Goal is less than 7.0 Increase Lantus from 30 units to 35 units at bedtime Will initiate statin for primary cardiovascular prevention Counseled on Diabetic diet, my plate method, [id number] minutes of moderate intensity exercise/week Blood sugar logs with fasting goals of 80-120 mg/dl, random of less than 180 and in the event of sugars less than 60 mg/dl or greater than 400 mg/dl encouraged to notify the clinic. Advised on the need for annual eye exams, annual foot exams, Pneumonia vaccine. - POCT glucose (manual entry) - POCT glycosylated hemoglobin (Hb A1C) - insulin glargine (LANTUS) 100 UNIT/ML Solostar Pen; Inject 35 Units into the skin at bedtime.  Dispense: 15 mL; Refill: 6 - atorvastatin (LIPITOR) 20 MG tablet; Take 1 tablet (20 mg total) by mouth daily.  Dispense: 30 tablet; Refill: 3 - metFORMIN (GLUCOPHAGE-XR) 500 MG 24 hr tablet; TAKE 1 TABLET (500 MG TOTAL) BY  MOUTH 2 (TWO) TIMES DAILY.  Dispense: 60 tablet; Refill: 6 - Basic Metabolic Panel  2. Hypertension associated with diabetes ([place]) Slightly above goal No regimen changes today Consider low-dose amlodipine at next visit if still above goal Counseled on blood pressure goal of less than 130/80, low-sodium, DASH diet, medication compliance, 150 minutes of moderate intensity exercise per week. Discussed medication compliance, adverse effects. - losartan-hydrochlorothiazide (HYZAAR) 100-25 MG tablet; TAKE 1 TABLET BY MOUTH DAILY.  Dispense: 30 tablet; Refill: 6  3. Diabetic polyneuropathy associated with type 2 diabetes mellitus ([place]) Uncontrolled Will initiate gabapentin - gabapentin (NEURONTIN) 300 MG capsule; Take 1 capsule (300 mg total) by mouth at bedtime.  Dispense: 30 capsule; Refill: 6  4. Closed fracture of proximal end of left tibia with routine healing, unspecified fracture morphology, subsequent encounter Stable Followed by orthopedics Fall precautions   Meds ordered this encounter  Medications  . insulin glargine (LANTUS) 100 UNIT/ML Solostar Pen    Sig: Inject 35 Units into the skin at bedtime.    Dispense:  15 mL    Refill:  6    Dose change  . losartan-hydrochlorothiazide (HYZAAR) 100-25 MG tablet    Sig: TAKE 1 TABLET BY MOUTH DAILY.    Dispense:  30 tablet    Refill:  6  . gabapentin (NEURONTIN) 300 MG capsule    Sig: Take 1 capsule (300 mg total) by mouth at bedtime.    Dispense:  30 capsule    Refill:  6  . atorvastatin (LIPITOR) 20 MG tablet    Sig: Take 1 tablet (20 mg total) by mouth daily.    Dispense:  30 tablet    Refill:  3  . metFORMIN (GLUCOPHAGE-XR) 500 MG 24 hr  tablet    Sig: TAKE 1 TABLET (500 MG TOTAL) BY MOUTH 2 (TWO) TIMES DAILY.    Dispense:  60 tablet    Refill:  6    Follow-up: Return in about 3 months (around 01/21/2021) for Diabetes.       [name], MD, FAAFP. [facility] and [place],  [place]   10/22/2020, 11:23 AM

## 2020-10-22 NOTE — Patient Instructions (Signed)
Diabetic Neuropathy Diabetic neuropathy refers to nerve damage that is caused by diabetes. Over time, people with diabetes can develop nerve damage throughout the body. There are several types of diabetic neuropathy:  Peripheral neuropathy. This is the most common type of diabetic neuropathy. It damages the nerves that carry signals between the spinal cord and other parts of the body (peripheral nerves). This usually affects nerves in the feet, legs, hands, and arms.  Autonomic neuropathy. This type causes damage to nerves that control involuntary functions (autonomic nerves). Involuntary functions are functions of the body that you do not control. They include heartbeat, body temperature, blood pressure, urination, digestion, sweating, sexual function, or response to changes in blood glucose.  Focal neuropathy. This type of nerve damage affects one area of the body, such as an arm, a leg, or the face. The injury may involve one nerve or a small group of nerves. Focal neuropathy can be painful and unpredictable. It occurs most often in older adults with diabetes. This often develops suddenly, but usually improves over time and does not cause long-term problems.  Proximal neuropathy. This type of nerve damage affects the nerves of the thighs, hips, buttocks, or legs. It causes severe pain, weakness, and muscle death (atrophy), usually in the thigh muscles. It is more common among older men and people who have type 2 diabetes. The length of recovery time may vary. What are the causes? Peripheral, autonomic, and focal neuropathies are caused by diabetes that is not well controlled with treatment. The cause of proximal neuropathy is not known, but it may be caused by inflammation related to uncontrolled blood glucose levels. What are the signs or symptoms? Peripheral neuropathy Peripheral neuropathy develops slowly over time. When the nerves of the feet and legs no longer work, you may  experience:  Burning, stabbing, or aching pain in the legs or feet.  Pain or cramping in the legs or feet.  Loss of feeling (numbness) and inability to feel pressure or pain in the feet. This can lead to: ? Thick calluses or sores on areas of constant pressure. ? Ulcers. ? Reduced ability to feel temperature changes.  Foot deformities.  Muscle weakness.  Loss of balance or coordination. Autonomic neuropathy The symptoms of autonomic neuropathy vary depending on which nerves are affected. Symptoms may include:  Problems with digestion, such as: ? Nausea or vomiting. ? Poor appetite. ? Bloating. ? Diarrhea or constipation. ? Trouble swallowing. ? Losing weight without trying to.  Problems with the heart, blood, and lungs, such as: ? Dizziness, especially when standing up. ? Fainting. ? Shortness of breath. ? Irregular heartbeat.  Bladder problems, such as: ? Trouble starting or stopping urination. ? Leaking urine. ? Trouble emptying the bladder. ? Urinary tract infections (UTIs).  Problems with other body functions, such as: ? Sweat. You may sweat too much or too little. ? Temperature. You might get hot easily. Or, you might feel cold more than usual. ? Sexual function. Men may not be able to get or maintain an erection. Women may have vaginal dryness and difficulty with arousal. Focal neuropathy Symptoms affect only one area of the body. Common symptoms include:  Numbness.  Tingling.  Burning pain.  Prickling feeling.  Very sensitive skin.  Weakness.  Inability to move (paralysis).  Muscle twitching.  Muscles getting smaller (wasting).  Poor coordination.  Double or blurred vision. Proximal neuropathy  Sudden, severe pain in the hip, thigh, or buttocks. Pain may spread from the back into the legs (  sciatica).  Pain and numbness in the arms and legs.  Tingling.  Loss of bladder control or bowel control.  Weakness and wasting of thigh  muscles.  Difficulty getting up from a seated position.  Abdominal swelling.  Unexplained weight loss. How is this diagnosed? Diagnosis varies depending on the type of neuropathy your health care provider suspects. Peripheral neuropathy Your health care provider will do a neurologic exam. This exam checks your reflexes, how you move, and what you can feel. You may have other tests, such as:  Blood tests.  Tests of the fluid that surrounds the spinal cord (lumbar puncture).  CT scan.  MRI.  Checking the nerves that control muscles (electromyogram, or EMG).  Checking how quickly signals pass through your nerves (nerve conduction study).  Checking a small piece of a nerve using a microscope (biopsy). Autonomic neuropathy You may have tests, such as:  Tests to measure your blood pressure and heart rate. You may be secured to an exam table that moves you from a lying position to an upright position (table tilt test).  Breathing tests to check your lungs.  Tests to check how food moves through the digestive system (gastric emptying tests).  Blood, sweat, or urine tests.  Ultrasound of your bladder.  Spinal fluid tests. Focal neuropathy This condition may be diagnosed with:  A neurologic exam.  CT scan.  MRI.  EMG.  Nerve conduction study. Proximal neuropathy There is no test to diagnose this type of neuropathy. You may have tests to rule out other possible causes of this type of neuropathy. Tests may include:  X-rays of your spine and lumbar region.  Lumbar puncture.  MRI. How is this treated? The goal of treatment is to keep nerve damage from getting worse. Treatment may include:  Following your diabetes management plan. This will help keep your blood glucose level and your A1C level within your target range. This is the most important treatment.  Using prescription pain medicine. Follow these instructions at home: Diabetes management Follow your diabetes  management plan as told by your health care provider.  Check your blood glucose levels.  Keep your blood glucose in your target range.  Have your A1C level checked at least two times a year, or as often as told.  Take over the counter and prescription medicines only as told by your health care provider. This includes insulin and diabetes medicine.   Lifestyle  Do not use any products that contain nicotine or tobacco, such as cigarettes, e-cigarettes, and chewing tobacco. If you need help quitting, ask your health care provider.  Be physically active every day. Include strength training and balance exercises.  Follow a healthy meal plan.  Work with your health care provider to manage your blood pressure.   General instructions  Ask your health care provider if the medicine prescribed to you requires you to avoid driving or using machinery.  Check your skin and feet every day for cuts, bruises, redness, blisters, or sores.  Keep all follow-up visits. This is important. Contact a health care provider if:  You have burning, stabbing, or aching pain in your legs or feet.  You are unable to feel pressure or pain in your feet.  You develop problems with digestion, such as: ? Nausea. ? Vomiting. ? Bloating. ? Constipation. ? Diarrhea. ? Abdominal pain.  You have difficulty with urination, such as: ? Inability to control when you urinate (incontinence). ? Inability to completely empty the bladder (retention).  You feel   as if your heart is racing (palpitations).  You feel dizzy, weak, or faint when you stand up. Get help right away if:  You cannot urinate.  You have sudden weakness or loss of coordination.  You have trouble speaking.  You have pain or pressure in your chest.  You have an irregular heartbeat.  You have sudden inability to move a part of your body. These symptoms may represent a serious problem that is an emergency. Do not wait to see if the symptoms  will go away. Get medical help right away. Call your local emergency services (911 in the [place] Do not drive yourself to the hospital. Summary  Diabetic neuropathy is nerve damage that is caused by diabetes. It can cause numbness and pain in the arms, legs, digestive tract, heart, and other body systems.  This condition is treated by keeping your blood glucose level and your A1C level within your target range. This can help prevent neuropathy from getting worse.  Check your skin and feet every day for cuts, bruises, redness, blisters, or sores.  Do not use any products that contain nicotine or tobacco, such as cigarettes, e-cigarettes, and chewing tobacco. This information is not intended to replace advice given to you by your health care provider. Make sure you discuss any questions you have with your health care provider. Document Revised: 11/01/2019 Document Reviewed: 11/01/2019 Elsevier Patient Education  2021 [employer]vier Inc.  

## 2020-10-23 ENCOUNTER — Telehealth: Payer: Self-pay

## 2020-10-23 LAB — BASIC METABOLIC PANEL
BUN/Creatinine Ratio: 14 (ref 9–20)
BUN: 19 mg/dL (ref 6–24)
CO2: 20 mmol/L (ref 20–29)
Calcium: 8.3 mg/dL — ABNORMAL LOW (ref 8.7–10.2)
Chloride: 110 mmol/L — ABNORMAL HIGH (ref 96–106)
Creatinine, Ser: 1.35 mg/dL — ABNORMAL HIGH (ref 0.76–1.27)
Glucose: 172 mg/dL — ABNORMAL HIGH (ref 65–99)
Potassium: 4.4 mmol/L (ref 3.5–5.2)
Sodium: 145 mmol/L — ABNORMAL HIGH (ref 134–144)
eGFR: 65 mL/min/{1.73_m2} (ref >59)

## 2020-10-23 NOTE — Telephone Encounter (Signed)
-----   Message from [name], MD sent at 10/23/2020  2:09 PM EDT ----- His labs revealed slightly low calcium.  Please advised to increase intake of calcium which can help with building his bones.

## 2020-10-23 NOTE — Telephone Encounter (Signed)
Attempted to call pt no answer no VM setup to leave message. Will try to call pt again later.

## 2020-10-27 NOTE — Telephone Encounter (Signed)
-----   Message from [name], MD sent at 10/23/2020  2:09 PM EDT ----- His labs revealed slightly low calcium.  Please advised to increase intake of calcium which can help with building his bones.

## 2020-10-27 NOTE — Telephone Encounter (Signed)
Attempted to call pt multiple times no answer or VM setup to leave message. Pt lab results sent via mail to address on file.

## 2020-11-10 ENCOUNTER — Other Ambulatory Visit: Payer: Self-pay | Admitting: Orthopedic Surgery

## 2020-11-10 DIAGNOSIS — L97529 Non-pressure chronic ulcer of other part of left foot with unspecified severity: Secondary | ICD-10-CM

## 2020-11-12 ENCOUNTER — Inpatient Hospital Stay (HOSPITAL_COMMUNITY)
Admission: EM | Admit: 2020-11-12 | Discharge: 2020-11-15 | DRG: 853 | Disposition: A | Discharge status: Home Health | Payer: Medicaid Other | Attending: Internal Medicine | Admitting: Internal Medicine

## 2020-11-12 ENCOUNTER — Encounter (HOSPITAL_COMMUNITY): Payer: Self-pay | Admitting: *Deleted

## 2020-11-12 ENCOUNTER — Other Ambulatory Visit: Payer: Self-pay

## 2020-11-12 ENCOUNTER — Emergency Department (HOSPITAL_COMMUNITY): Payer: Medicaid Other

## 2020-11-12 DIAGNOSIS — Z79899 Other long term (current) drug therapy: Secondary | ICD-10-CM

## 2020-11-12 DIAGNOSIS — E1122 Type 2 diabetes mellitus with diabetic chronic kidney disease: Secondary | ICD-10-CM | POA: Diagnosis not present

## 2020-11-12 DIAGNOSIS — Z20822 Contact with and (suspected) exposure to covid-19: Secondary | ICD-10-CM | POA: Diagnosis not present

## 2020-11-12 DIAGNOSIS — E1142 Type 2 diabetes mellitus with diabetic polyneuropathy: Secondary | ICD-10-CM | POA: Diagnosis present

## 2020-11-12 DIAGNOSIS — E11319 Type 2 diabetes mellitus with unspecified diabetic retinopathy without macular edema: Secondary | ICD-10-CM | POA: Diagnosis present

## 2020-11-12 DIAGNOSIS — L03116 Cellulitis of left lower limb: Secondary | ICD-10-CM | POA: Diagnosis not present

## 2020-11-12 DIAGNOSIS — Z7984 Long term (current) use of oral hypoglycemic drugs: Secondary | ICD-10-CM | POA: Diagnosis not present

## 2020-11-12 DIAGNOSIS — I129 Hypertensive chronic kidney disease with stage 1 through stage 4 chronic kidney disease, or unspecified chronic kidney disease: Secondary | ICD-10-CM | POA: Diagnosis present

## 2020-11-12 DIAGNOSIS — Z888 Allergy status to other drugs, medicaments and biological substances status: Secondary | ICD-10-CM

## 2020-11-12 DIAGNOSIS — N182 Chronic kidney disease, stage 2 (mild): Secondary | ICD-10-CM | POA: Diagnosis not present

## 2020-11-12 DIAGNOSIS — G054 Myelitis in diseases classified elsewhere: Secondary | ICD-10-CM | POA: Diagnosis present

## 2020-11-12 DIAGNOSIS — F17210 Nicotine dependence, cigarettes, uncomplicated: Secondary | ICD-10-CM | POA: Diagnosis not present

## 2020-11-12 DIAGNOSIS — E11621 Type 2 diabetes mellitus with foot ulcer: Secondary | ICD-10-CM | POA: Diagnosis not present

## 2020-11-12 DIAGNOSIS — L97529 Non-pressure chronic ulcer of other part of left foot with unspecified severity: Secondary | ICD-10-CM | POA: Diagnosis present

## 2020-11-12 DIAGNOSIS — Z833 Family history of diabetes mellitus: Secondary | ICD-10-CM | POA: Diagnosis not present

## 2020-11-12 DIAGNOSIS — Z794 Long term (current) use of insulin: Secondary | ICD-10-CM | POA: Diagnosis not present

## 2020-11-12 DIAGNOSIS — E1165 Type 2 diabetes mellitus with hyperglycemia: Secondary | ICD-10-CM | POA: Diagnosis present

## 2020-11-12 DIAGNOSIS — E11628 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 complications: Secondary | ICD-10-CM | POA: Diagnosis not present

## 2020-11-12 DIAGNOSIS — N179 Acute kidney failure, unspecified: Secondary | ICD-10-CM | POA: Diagnosis not present

## 2020-11-12 DIAGNOSIS — L02612 Cutaneous abscess of left foot: Secondary | ICD-10-CM | POA: Diagnosis not present

## 2020-11-12 DIAGNOSIS — M869 Osteomyelitis, unspecified: Secondary | ICD-10-CM | POA: Diagnosis not present

## 2020-11-12 DIAGNOSIS — A419 Sepsis, unspecified organism: Principal | ICD-10-CM | POA: Diagnosis present

## 2020-11-12 DIAGNOSIS — Z89421 Acquired absence of other right toe(s): Secondary | ICD-10-CM | POA: Diagnosis not present

## 2020-11-12 DIAGNOSIS — E1169 Type 2 diabetes mellitus with other specified complication: Secondary | ICD-10-CM | POA: Diagnosis present

## 2020-11-12 DIAGNOSIS — I10 Essential (primary) hypertension: Secondary | ICD-10-CM

## 2020-11-12 DIAGNOSIS — E43 Unspecified severe protein-calorie malnutrition: Secondary | ICD-10-CM

## 2020-11-12 LAB — URINALYSIS, ROUTINE W REFLEX MICROSCOPIC
Bacteria, UA: NONE SEEN
Bilirubin Urine: NEGATIVE
Glucose, UA: 50 mg/dL — AB
Ketones, ur: NEGATIVE mg/dL
Leukocytes,Ua: NEGATIVE
Nitrite: NEGATIVE
Protein, ur: >=300 mg/dL — AB
RBC / HPF: >50 RBC/hpf — ABNORMAL HIGH (ref 0–5)
Specific Gravity, Urine: 1.016 (ref 1.005–1.030)
pH: 7 (ref 5.0–8.0)

## 2020-11-12 LAB — PROTIME-INR
INR: 1.3 — ABNORMAL HIGH (ref 0.8–1.2)
Prothrombin Time: 15.9 seconds — ABNORMAL HIGH (ref 11.4–15.2)

## 2020-11-12 LAB — CBC WITH DIFFERENTIAL/PLATELET
Abs Immature Granulocytes: 0.14 10*3/uL — ABNORMAL HIGH (ref 0.00–0.07)
Basophils Absolute: 0 10*3/uL (ref 0.0–0.1)
Basophils Relative: 0 %
Eosinophils Absolute: 0 10*3/uL (ref 0.0–0.5)
Eosinophils Relative: 0 %
HCT: 29.3 % — ABNORMAL LOW (ref 39.0–52.0)
Hemoglobin: 9 g/dL — ABNORMAL LOW (ref 13.0–17.0)
Immature Granulocytes: 1 %
Lymphocytes Relative: 5 %
Lymphs Abs: 0.9 10*3/uL (ref 0.7–4.0)
MCH: 25.9 pg — ABNORMAL LOW (ref 26.0–34.0)
MCHC: 30.7 g/dL (ref 30.0–36.0)
MCV: 84.4 fL (ref 80.0–100.0)
Monocytes Absolute: 0.8 10*3/uL (ref 0.1–1.0)
Monocytes Relative: 5 %
Neutro Abs: 15.1 10*3/uL — ABNORMAL HIGH (ref 1.7–7.7)
Neutrophils Relative %: 89 %
Platelets: 487 10*3/uL — ABNORMAL HIGH (ref 150–400)
RBC: 3.47 MIL/uL — ABNORMAL LOW (ref 4.22–5.81)
RDW: 15.9 % — ABNORMAL HIGH (ref 11.5–15.5)
WBC: 17 10*3/uL — ABNORMAL HIGH (ref 4.0–10.5)
nRBC: 0 % (ref 0.0–0.2)

## 2020-11-12 LAB — COMPREHENSIVE METABOLIC PANEL
ALT: 10 U/L (ref 0–44)
AST: 12 U/L — ABNORMAL LOW (ref 15–41)
Albumin: 2.3 g/dL — ABNORMAL LOW (ref 3.5–5.0)
Alkaline Phosphatase: 92 U/L (ref 38–126)
Anion gap: 7 (ref 5–15)
BUN: 26 mg/dL — ABNORMAL HIGH (ref 6–20)
CO2: 28 mmol/L (ref 22–32)
Calcium: 8.4 mg/dL — ABNORMAL LOW (ref 8.9–10.3)
Chloride: 99 mmol/L (ref 98–111)
Creatinine, Ser: 1.84 mg/dL — ABNORMAL HIGH (ref 0.61–1.24)
GFR, Estimated: 45 mL/min — ABNORMAL LOW (ref >60)
Glucose, Bld: 239 mg/dL — ABNORMAL HIGH (ref 70–99)
Potassium: 4.3 mmol/L (ref 3.5–5.1)
Sodium: 134 mmol/L — ABNORMAL LOW (ref 135–145)
Total Bilirubin: 0.5 mg/dL (ref 0.3–1.2)
Total Protein: 7.7 g/dL (ref 6.5–8.1)

## 2020-11-12 LAB — RESP PANEL BY RT-PCR (FLU A&B, COVID) ARPGX2
Influenza A by PCR: NEGATIVE
Influenza B by PCR: NEGATIVE
SARS Coronavirus 2 by RT PCR: NEGATIVE

## 2020-11-12 LAB — LACTIC ACID, PLASMA: Lactic Acid, Venous: 0.9 mmol/L (ref 0.5–1.9)

## 2020-11-12 LAB — GLUCOSE, CAPILLARY: Glucose-Capillary: 212 mg/dL — ABNORMAL HIGH (ref 70–99)

## 2020-11-12 LAB — HEMOGLOBIN A1C
Hgb A1c MFr Bld: 8.6 % — ABNORMAL HIGH (ref 4.8–5.6)
Mean Plasma Glucose: 200.12 mg/dL

## 2020-11-12 LAB — APTT: aPTT: 41 seconds — ABNORMAL HIGH (ref 24–36)

## 2020-11-12 MED ORDER — ACETAMINOPHEN 650 MG RE SUPP: 650.0000 mg | Freq: Four times a day (QID) | RECTAL | Status: DC | PRN

## 2020-11-12 MED ORDER — METRONIDAZOLE 500 MG/100ML IV SOLN
500.0000 mg | Freq: Once | INTRAVENOUS | Status: AC
  Administered 2020-11-12: 500 mg via INTRAVENOUS
  Filled 2020-11-12: qty 100

## 2020-11-12 MED ORDER — METRONIDAZOLE 500 MG/100ML IV SOLN
500.0000 mg | Freq: Three times a day (TID) | INTRAVENOUS | Status: DC
  Administered 2020-11-13 – 2020-11-15 (×8): 500 mg via INTRAVENOUS
  Filled 2020-11-12 (×8): qty 100

## 2020-11-12 MED ORDER — OXYCODONE HCL 5 MG PO TABS
5.0000 mg | ORAL_TABLET | ORAL | Status: DC | PRN
  Administered 2020-11-13 – 2020-11-15 (×6): 5 mg via ORAL
  Filled 2020-11-12 (×6): qty 1

## 2020-11-12 MED ORDER — LABETALOL HCL 5 MG/ML IV SOLN: 10.0000 mg | INTRAVENOUS | Status: DC | PRN

## 2020-11-12 MED ORDER — INSULIN ASPART 100 UNIT/ML IJ SOLN
0.0000 [IU] | Freq: Three times a day (TID) | INTRAMUSCULAR | Status: DC
  Administered 2020-11-13 (×2): 5 [IU] via SUBCUTANEOUS
  Administered 2020-11-13 – 2020-11-14 (×3): 3 [IU] via SUBCUTANEOUS
  Administered 2020-11-15: 2 [IU] via SUBCUTANEOUS
  Filled 2020-11-12: qty 0.15

## 2020-11-12 MED ORDER — ACETAMINOPHEN 325 MG PO TABS
650.0000 mg | ORAL_TABLET | Freq: Four times a day (QID) | ORAL | Status: DC | PRN
  Administered 2020-11-14 – 2020-11-15 (×2): 650 mg via ORAL
  Filled 2020-11-12 (×2): qty 2

## 2020-11-12 MED ORDER — METRONIDAZOLE 500 MG/100ML IV SOLN: 500.0000 mg | Freq: Three times a day (TID) | INTRAVENOUS | Status: DC

## 2020-11-12 MED ORDER — SODIUM CHLORIDE 0.9 % IV SOLN: 2.0000 g | Freq: Three times a day (TID) | INTRAVENOUS | Status: DC

## 2020-11-12 MED ORDER — HYDRALAZINE HCL 20 MG/ML IJ SOLN: 10.0000 mg | INTRAMUSCULAR | Status: DC | PRN

## 2020-11-12 MED ORDER — SODIUM CHLORIDE 0.9 % IV SOLN
2.0000 g | Freq: Once | INTRAVENOUS | Status: AC
  Administered 2020-11-12: 2 g via INTRAVENOUS
  Filled 2020-11-12: qty 2

## 2020-11-12 MED ORDER — VANCOMYCIN HCL IN DEXTROSE 1-5 GM/200ML-% IV SOLN
1000.0000 mg | Freq: Once | INTRAVENOUS | Status: AC
  Administered 2020-11-12: 1000 mg via INTRAVENOUS
  Filled 2020-11-12: qty 200

## 2020-11-12 MED ORDER — VANCOMYCIN HCL 1500 MG/300ML IV SOLN
1500.0000 mg | INTRAVENOUS | Status: DC
  Administered 2020-11-13 – 2020-11-14 (×2): 1500 mg via INTRAVENOUS
  Filled 2020-11-12 (×3): qty 300

## 2020-11-12 MED ORDER — INSULIN ASPART 100 UNIT/ML IJ SOLN
0.0000 [IU] | Freq: Every day | INTRAMUSCULAR | Status: DC
  Administered 2020-11-12: 2 [IU] via SUBCUTANEOUS
  Filled 2020-11-12: qty 0.05

## 2020-11-12 MED ORDER — VANCOMYCIN HCL 750 MG/150ML IV SOLN
750.0000 mg | Freq: Once | INTRAVENOUS | Status: AC
  Administered 2020-11-12: 750 mg via INTRAVENOUS
  Filled 2020-11-12: qty 150

## 2020-11-12 MED ORDER — SODIUM CHLORIDE 0.9 % IV SOLN
2.0000 g | Freq: Three times a day (TID) | INTRAVENOUS | Status: DC
  Administered 2020-11-13 – 2020-11-15 (×8): 2 g via INTRAVENOUS
  Filled 2020-11-12 (×8): qty 2

## 2020-11-12 MED ORDER — HEPARIN SODIUM (PORCINE) 5000 UNIT/ML IJ SOLN
5000.0000 [IU] | Freq: Three times a day (TID) | INTRAMUSCULAR | Status: DC
  Administered 2020-11-12 – 2020-11-13 (×4): 5000 [IU] via SUBCUTANEOUS
  Filled 2020-11-12 (×4): qty 1

## 2020-11-12 NOTE — ED Notes (Signed)
Attempted report to 5N instructed to call back in 10 minutes.

## 2020-11-12 NOTE — ED Provider Notes (Signed)
[facility] DEPT Provider Note   CSN: [id number] Arrival date & time: 11/12/20  1414     History Chief Complaint  Patient presents with  . Wound Infection    Todd Mccoy is a 50 y.o. male.  HPI   50 year old male with a history of diabetes, diabetic neuropathy, diabetic retinopathy, hypertension, osteomyelitis, who presents to the emergency department today for evaluation of a wound to the left foot.  He states that he cut his foot several weeks ago since then developed a wound to the area.  He has had purulent drainage and foul odor from the wound for several weeks.  He denies any fevers or systemic symptoms at home.  Past Medical History:  Diagnosis Date  . Diabetes mellitus without complication ([place])   . Diabetic neuropathy ([place])   . 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 Hypertension     Patient Active Problem List   Diagnosis Date Noted  . Osteomyelitis of second toe of right foot ([place])   . 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 Cellulitis and abscess of foot   . Toe osteomyelitis ([place]) 03/24/2020  . Necrotizing fasciitis ([place]) 09/08/2014  . Diabetes mellitus with hyperglycemia ([place]) 09/07/2014  . Tobacco abuse 09/07/2014  . Abscess of back   . Abscess of lower back 09/06/2014  . DM2 (diabetes mellitus, type 2) ([place]) 09/06/2014  . Sepsis ([place]) 09/06/2014  . Scrotal abscess 06/20/2014    Past Surgical History:  Procedure Laterality Date  . ABSCESS DRAINAGE     neck  . AMPUTATION TOE Right 03/25/2020   Procedure: AMPUTATION TOE;  Surgeon: Evelina Bucy, DPM;  Location: [facility] ORS;  Service: Podiatry;  Laterality: Right;  . INCISION AND DRAINAGE ABSCESS Right 09/07/2014   Procedure: INCISION AND DRAINAGE ABSCESS  RIGHT FLANK;  Surgeon: Jackolyn Confer, MD;  Location: [facility]S;  Service: General;  Laterality: Right;  . SCROTUM EXPLORATION         Family History  Problem Relation Age of Onset  . Diabetes Mother   . Diabetes Father   . Diabetes Brother   . Colon cancer Neg Hx   . Esophageal cancer Neg Hx   . Pancreatic cancer Neg Hx   . Stomach cancer Neg Hx   . Liver disease Neg Hx     Social History   Tobacco Use  . Smoking status: Current Every Day Smoker    Packs/day: 0.50    Types: Cigarettes  . Smokeless tobacco: Never Used  Substance Use Topics  . Alcohol use: No  . Drug use: No    Home Medications Prior to Admission medications   Medication Sig Start Date End Date Taking? Authorizing Provider  acetaminophen (TYLENOL) 325 MG tablet Take 2 tablets (650 mg total) by mouth every 6 (six) hours as needed for mild pain or fever. 03/27/20   Hongalgi, Lenis Dickinson, MD  atorvastatin (LIPITOR) 20 MG tablet Take 1 tablet (20 mg total) by mouth daily. 10/22/20   [name], MD  gabapentin (NEURONTIN) 300 MG capsule Take 1 capsule (300 mg total) by mouth at bedtime. 10/22/20   [name], MD  HYDROcodone-acetaminophen (NORCO/VICODIN) 5-325 MG tablet Take 1 tablet by mouth every 4 (four) hours as needed  for moderate pain or severe pain. 07/15/20   Daleen Bo, MD  insulin glargine (LANTUS) 100 UNIT/ML Solostar Pen Inject 35 Units into the skin at bedtime. 10/22/20 10/22/21  [name], MD  Insulin Pen Needle 32G X 4 MM MISC USE TO INJECT INSULIN DAILY. 05/13/20 05/13/21  [name], MD  Insulin Syringe-Needle U-100 (SAFETY INSULIN SYRINGES) 30G X 1/2" 1 ML MISC 35 Units by Does not apply route 2 (two) times daily before a meal. 04/17/20   McClung, Dionne Bucy, PA-C  losartan-hydrochlorothiazide (HYZAAR) 100-25 MG tablet TAKE 1 TABLET BY MOUTH DAILY. 10/22/20 10/22/21  [name], MD  metFORMIN (GLUCOPHAGE-XR) 500 MG 24 hr tablet TAKE 1 TABLET (500 MG TOTAL) BY MOUTH 2 (TWO) TIMES DAILY.  10/22/20 10/22/21  [name], MD  vitamin B-12 (CYANOCOBALAMIN) 500 MCG tablet Take 500 mcg by mouth daily.    [provider]  insulin aspart protamine- aspart (NOVOLOG MIX 70/30) (70-30) 100 UNIT/ML injection Inject 0.35 mLs (35 Units total) into the skin 2 (two) times daily. 04/17/20 05/13/20  Argentina Donovan, PA-C  losartan (COZAAR) 50 MG tablet Take 1 tablet (50 mg total) by mouth daily. 05/13/20 05/23/20  [name], MD    Allergies    Amlodipine  Review of Systems   Review of Systems  Constitutional: Negative for fever.  HENT: Negative for ear pain and sore throat.   Eyes: Negative for visual disturbance.  Respiratory: Negative for cough and shortness of breath.   Cardiovascular: Negative for chest pain.  Gastrointestinal: Negative for abdominal pain, nausea and vomiting.  Genitourinary: Negative for dysuria and hematuria.  Musculoskeletal: Negative for back pain.  Skin: Positive for wound.  Neurological: Negative for headaches.       Peripheral neuropathy  All other systems reviewed and are negative.   Physical Exam Updated Vital Signs BP 139/87   Pulse (!) 113   Temp 98.4 F (36.9 C) (Oral)   Resp (!) 21   SpO2 99%   Physical Exam Vitals and nursing note reviewed.  Constitutional:      Appearance: He is well-developed.  HENT:     Head: Normocephalic and atraumatic.  Eyes:     Conjunctiva/sclera: Conjunctivae normal.  Cardiovascular:     Rate and Rhythm: Regular rhythm. Tachycardia present.     Heart sounds: Normal heart sounds. No murmur heard.   Pulmonary:     Effort: Pulmonary effort is normal. No respiratory distress.     Breath sounds: Normal breath sounds. No wheezing, rhonchi or rales.  Abdominal:     General: Bowel sounds are normal.     Palpations: Abdomen is soft.     Tenderness: There is no abdominal tenderness. There is no guarding or rebound.  Musculoskeletal:     Cervical back: Neck supple.     Left lower leg: Edema  present.     Comments: Ulceration noted to the plantar aspect of the left lateral foot. Wound is malodorous. There is no obvious drainage from the wound. There is warmth and induration noted to the foot that extends up the shin/calf. There is no crepitus noted.   Skin:    General: Skin is warm and dry.  Neurological:     Mental Status: He is alert.         ED Results / Procedures / Treatments   Labs (all labs ordered are listed, but only abnormal results are displayed) Labs Reviewed  COMPREHENSIVE METABOLIC PANEL - Abnormal; Notable for the following components:  Result Value   Sodium 134 (*)    Glucose, Bld 239 (*)    BUN 26 (*)    Creatinine, Ser 1.84 (*)    Calcium 8.4 (*)    Albumin 2.3 (*)    AST 12 (*)    GFR, Estimated 45 (*)    All other components within normal limits  CBC WITH DIFFERENTIAL/PLATELET - Abnormal; Notable for the following components:   WBC 17.0 (*)    RBC 3.47 (*)    Hemoglobin 9.0 (*)    HCT 29.3 (*)    MCH 25.9 (*)    RDW 15.9 (*)    Platelets 487 (*)    Neutro Abs 15.1 (*)    Abs Immature Granulocytes 0.14 (*)    All other components within normal limits  PROTIME-INR - Abnormal; Notable for the following components:   Prothrombin Time 15.9 (*)    INR 1.3 (*)    All other components within normal limits  APTT - Abnormal; Notable for the following components:   aPTT 41 (*)    All other components within normal limits  RESP PANEL BY RT-PCR (FLU A&B, COVID) ARPGX2  URINE CULTURE  CULTURE, BLOOD (ROUTINE X 2)  CULTURE, BLOOD (ROUTINE X 2)  LACTIC ACID, PLASMA  LACTIC ACID, PLASMA  URINALYSIS, ROUTINE W REFLEX MICROSCOPIC  HEMOGLOBIN A1C    EKG None  Radiology DG Foot Complete Left  Result Date: 11/12/2020 CLINICAL DATA:  Draining wound about the fifth toe, initial encounter EXAM: LEFT FOOT - COMPLETE 3+ VIEW COMPARISON:  01/04/2004 FINDINGS: Considerable soft tissue swelling is noted with focal soft tissue wound at the fifth MTP  joint. Fragmentation and erosive changes of the fifth proximal phalanx are seen. Some erosive change in the head of the fifth metatarsal is noted as well. These changes are consistent with osteomyelitis. Postsurgical changes in medial malleolus are again noted. No other focal abnormality is seen. IMPRESSION: Soft tissue wound with associated changes consistent with osteomyelitis about the fifth MTP joint. Electronically Signed   By: Inez Catalina M.D.   On: 11/12/2020 15:33    Procedures Procedures   Medications Ordered in ED Medications  metroNIDAZOLE (FLAGYL) IVPB 500 mg (has no administration in time range)  vancomycin (VANCOREADY) IVPB 750 mg/150 mL (750 mg Intravenous New Bag/Given 11/12/20 1738)  insulin aspart (novoLOG) injection 0-15 Units (has no administration in time range)  insulin aspart (novoLOG) injection 0-5 Units (has no administration in time range)  ceFEPIme (MAXIPIME) 2 g in sodium chloride 0.9 % 100 mL IVPB (0 g Intravenous Stopped 11/12/20 1735)  vancomycin (VANCOCIN) IVPB 1000 mg/200 mL premix (0 mg Intravenous Stopped 11/12/20 1702)    ED Course  I have reviewed the triage vital signs and the nursing notes.  Pertinent labs & imaging results that were available during my care of the patient were reviewed by me and considered in my medical decision making (see chart for details).    MDM Rules/Calculators/A&P                          50 y/o M presenting for eval of left foot wound ongoing for several weeks  Reviewed/interpreted labs CBC with leukocytosis at 17, anemia present but stable CMP with elevated glucose, elevated BUN/Cr at 26 and 1.84, lfts reassuring Lactic wnl Blood cultures obtained COVID negative  EKG with sinus tach and nonspecific t abnormalities in the lateral leads  Xray left foot -  Soft tissue wound  with associated changes consistent with osteomyelitis about the fifth MTP joint.  Pt with osteomyelitis. ivf and abx ordered in the ED.  5:17 PM  CONSULT with Dr. Sabino Gasser who accepts patient for admission.   5:30 PM CONSULT with Dr. Percell Miller who requests pt be admitted to [facility]  Final Clinical Impression(s) / ED Diagnoses Final diagnoses:  Osteomyelitis, unspecified site, unspecified type [facility])    Rx / [place] Orders ED Discharge Orders    None       Bishop Dublin 11/12/20 1750    Carmin Muskrat, MD 11/13/20 2258

## 2020-11-12 NOTE — Plan of Care (Signed)
  Problem: Education: Goal: Knowledge of General Education information will improve Description: Including pain rating scale, medication(s)/side effects and non-pharmacologic comfort measures Outcome: Progressing   Problem: Clinical Measurements: Goal: Ability to maintain clinical measurements within normal limits will improve Outcome: Progressing Goal: Will remain free from infection Outcome: Progressing Goal: Diagnostic test results will improve Outcome: Progressing Goal: Respiratory complications will improve Outcome: Progressing Goal: Cardiovascular complication will be avoided Outcome: Progressing   Problem: Pain Managment: Goal: General experience of comfort will improve Outcome: Progressing   Problem: Safety: Goal: Ability to remain free from injury will improve Outcome: Progressing   Problem: Skin Integrity: Goal: Risk for impaired skin integrity will decrease Outcome: Progressing   Problem: Fluid Volume: Goal: Hemodynamic stability will improve Outcome: Progressing   Problem: Clinical Measurements: Goal: Diagnostic test results will improve Outcome: Progressing Goal: Signs and symptoms of infection will decrease Outcome: Progressing   Problem: Respiratory: Goal: Ability to maintain adequate ventilation will improve Outcome: Progressing

## 2020-11-12 NOTE — ED Notes (Signed)
Failed attempt at iv x2 [place] IV RN attempting

## 2020-11-12 NOTE — Progress Notes (Signed)
Pharmacy Antibiotic Note  Todd Mccoy is a 50 y.o. male admitted on 11/12/2020 with left foot diabetic wound.  Pharmacy has been consulted for Cefepime and vancomycin dosing for cellulitis with suspected osteomyelitis. SCr 1.84 (baseline ~ 1.3) WBC 17 Lactic acid 0.9 Last documented weight 90 kg (10/22/20).  Follow up updated weight when available.    Plan:  Metronidazole per MD  Cefepime 2g IV q8h   Vancomycin '1750mg'$  IV load, followed by '1500mg'$  IV q24h (SCr 1.84, Est AUC 498)  Measure Vanc peak and trough as needed.  Goal AUC = 400 - 550.  Follow up renal function, culture results, and clinical course.      Temp (24hrs), Avg:98.4 F (36.9 C), Min:98.4 F (36.9 C), Max:98.4 F (36.9 C)  Recent Labs  Lab 11/12/20 1540 11/12/20 1545  WBC 17.0*  --   CREATININE 1.84*  --   LATICACIDVEN  --  0.9    CrCl cannot be calculated (Unknown ideal weight.).    Allergies  Allergen Reactions  . Amlodipine Swelling    BLE edema    Antimicrobials this admission: 5/11 Cefepime >> 5/11 Vancomycin >>  5/11 Metronidazole >>  Dose adjustments this admission:  Microbiology results: 5/11 BCx:  Thank you for allowing pharmacy to be a part of this patient's care.  Gretta Arab PharmD, BCPS Clinical Pharmacist [facility] main pharmacy [phone] 11/12/2020 6:59 PM

## 2020-11-12 NOTE — Progress Notes (Signed)
A consult was received from an ED physician for Cefepime and Vancomycin per pharmacy dosing.  The patient's profile has been reviewed for ht/wt/allergies/indication/available labs.   A one time order has been placed for Cefepime 2g IV and Vancomycin '1750mg'$  IV.  Further antibiotics/pharmacy consults should be ordered by admitting physician if indicated.                       Thank you,  Gretta Arab PharmD, BCPS Clinical Pharmacist [facility] main pharmacy [phone] 11/12/2020 4:02 PM

## 2020-11-12 NOTE — ED Triage Notes (Signed)
Pt complains of foul smelling, draining wound to left foot around his fifth toe, on the bottom of his foot. He first noticed the wound 1.5 weeks ago. Hx of diabetes.

## 2020-11-12 NOTE — ED Notes (Signed)
Attempted report x 2 to 5N with no answer from charge RN

## 2020-11-12 NOTE — Progress Notes (Signed)
NEW ADMISSION NOTE New Admission Note:    Arrival Method: Stetcher Mental Orientation: A&O x4 Telemetry:12 Assessment: Completed Skin: Intact IV: Clean, dry, intact Pain: 0/10 Tubes: none Safety Measures: Safety Fall Prevention Plan has been given, discussed and signed Admission: Completed 5 NorthOrientation: Patient has been orientated to the room, unit and staff.  Family: none present   Orders have been reviewed and implemented. Will continue to monitor the patient. Call light has been placed within reach and bed alarm has been activated.    Leanna Battles, RN

## 2020-11-12 NOTE — H&P (Signed)
History and Physical    Todd Mccoy  [id number]  DOB: 15-Dec-1970  DOA: 11/12/2020  PCP: Patient, No Pcp Per (Inactive) Patient coming from: home  Chief Complaint: left foot wound  HPI:  Todd Mccoy is a 50 year old male with PMH DM II, diabetic retinopathy and neuropathy, HTN who presents from home due to an ongoing left foot wound.  He states it started approximately 2 to 3 weeks ago and has started to become foul-smelling with some purulent drainage.  He was seen outpatient and started on antibiotics the day prior to admission.  He has only taken 1 day.  He does not know what he was taking. He denies fevers, chills, sweats. 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  In the ER he was tachycardic, mildly tachypneic and found to have leukocytosis. Left foot x-ray was obtained which showed soft tissue swelling and fragmentation and erosive changes in the fifth proximal phalanx concerning for underlying osteomyelitis. He was ordered a dose of vancomycin, cefepime, Flagyl while in the ER. ER provider discussed case with orthopedic surgery (Dr. Percell Miller) who recommended patient be transferred to [facility] for ongoing evaluation and treatment.  I have personally briefly reviewed patient's old medical records in [facility] and discussed patient with the ER provider when appropriate/indicated.  Assessment/Plan:   Sepsis - tachycardia, leukocytosis, left foot wound consistent with cellulitis and probable underlying OM - xray shows erosive changes  - hold off on MRI until further rec's from ortho - continue vanc, cefepime, flagyl -Blood cultures obtained in the ER -Case discussed with orthopedic surgery from the ER.  Recommended for transfer to [facility] for further evaluation  DMII - check A1c - SSI and CBG monitoring - carb diet for now; NPO at [facility]  HTN - await med rec - use labetalol or hydralazine  PRN for now   Diabetic retinopathy and neuropathy - Awaiting med rec - Follows outpatient with ophthalmology also   Code Status: Full DVT Prophylaxis: HSQ     Anticipated disposition is to: home  History: Past Medical History:  Diagnosis Date  . Diabetes mellitus without complication ([place])   . Diabetic neuropathy ([place])   . 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[place]) 03/25/2020   pt states retinal eye damage- left worse than the right- recent visited MD   . 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[place]) 03/25/2020   pt states recent MD visit /left eye worse than right  . Hypertension     Past Surgical History:  Procedure Laterality Date  . ABSCESS DRAINAGE     neck  . AMPUTATION TOE Right 03/25/2020   Procedure: AMPUTATION TOE;  Surgeon: Evelina Bucy, DPM;  Location: [facility];  Service: Podiatry;  Laterality: Right;  . INCISION AND DRAINAGE ABSCESS Right 09/07/2014   Procedure: INCISION AND DRAINAGE ABSCESS RIGHT FLANK;  Surgeon: Jackolyn Confer, MD;  Location: [facility];  Service: General;  Laterality: Right;  . SCROTUM EXPLORATION       reports that he has been smoking cigarettes. He has been smoking about 0.50 packs per day. He has never used smokeless tobacco. He reports that he does not drink alcohol and does not use drugs.  Allergies  Allergen Reactions  . Amlodipine Swelling    BLE edema    Family History  Problem Relation Age of Onset  . Diabetes Mother   . Diabetes Father   . Diabetes Brother   .  Colon cancer Neg Hx   . Esophageal cancer Neg Hx   . Pancreatic cancer Neg Hx   . Stomach cancer Neg Hx   . Liver disease Neg Hx    Home Medications: Prior to Admission medications   Medication Sig Start Date End Date Taking? Authorizing Provider  acetaminophen (TYLENOL) 325 MG tablet Take 2 tablets (650 mg total) by mouth every 6 (six) hours as needed for mild pain or fever. 03/27/20   Hongalgi, Lenis Dickinson, MD  atorvastatin (LIPITOR) 20 MG tablet Take 1 tablet (20 mg  total) by mouth daily. 10/22/20   [name], MD  gabapentin (NEURONTIN) 300 MG capsule Take 1 capsule (300 mg total) by mouth at bedtime. 10/22/20   [name], MD  HYDROcodone-acetaminophen (NORCO/VICODIN) 5-325 MG tablet Take 1 tablet by mouth every 4 (four) hours as needed for moderate pain or severe pain. 07/15/20   Daleen Bo, MD  insulin glargine (LANTUS) 100 UNIT/ML Solostar Pen Inject 35 Units into the skin at bedtime. 10/22/20 10/22/21  [name], MD  Insulin Pen Needle 32G X 4 MM MISC USE TO INJECT INSULIN DAILY. 05/13/20 05/13/21  [name], MD  Insulin Syringe-Needle U-100 (SAFETY INSULIN SYRINGES) 30G X 1/2" 1 ML MISC 35 Units by Does not apply route 2 (two) times daily before a meal. 04/17/20   McClung, Dionne Bucy, PA-C  losartan-hydrochlorothiazide (HYZAAR) 100-25 MG tablet TAKE 1 TABLET BY MOUTH DAILY. 10/22/20 10/22/21  [name], MD  metFORMIN (GLUCOPHAGE-XR) 500 MG 24 hr tablet TAKE 1 TABLET (500 MG TOTAL) BY MOUTH 2 (TWO) TIMES DAILY. 10/22/20 10/22/21  [name], MD  vitamin B-12 (CYANOCOBALAMIN) 500 MCG tablet Take 500 mcg by mouth daily.    [provider]  insulin aspart protamine- aspart (NOVOLOG MIX 70/30) (70-30) 100 UNIT/ML injection Inject 0.35 mLs (35 Units total) into the skin 2 (two) times daily. 04/17/20 05/13/20  Argentina Donovan, PA-C  losartan (COZAAR) 50 MG tablet Take 1 tablet (50 mg total) by mouth daily. 05/13/20 05/23/20  [name], MD    Review of Systems:  Pertinent items noted in HPI and remainder of comprehensive ROS otherwise negative.  Physical Exam: Vitals:   11/12/20 1418 11/12/20 1540 11/12/20 1600 11/12/20 1640  BP: (!) 158/93 (!) 163/101 (!) 164/98 139/87  Pulse: (!) 109 (!) 116 (!) 117 (!) 113  Resp: 18 18  (!) 21  Temp: 98.4 F (36.9 C)     TempSrc: Oral     SpO2: 95% 100% 100% 99%   General appearance: alert, cooperative and no distress Head: Normocephalic, without obvious abnormality,  atraumatic Eyes: EOMI. Poor visual acuity Lungs: clear to auscultation bilaterally Heart: regular rate and rhythm and S1, S2 normal Abdomen: normal findings: bowel sounds normal and soft, non-tender Extremities: right foot 2nd digit noted to be amputated; left foot is foul smelling with open purulent wound on lateral aspect involving 5 digit (LLE also has calor and is edematous), also see pic below taken 5/11 Skin: mobility and turgor normal Neurologic: Grossly normal     Labs on Admission:  I have personally reviewed following labs and imaging studies Results for orders placed or performed during the hospital encounter of 11/12/20 (from the past 24 hour(s))  Resp Panel by RT-PCR (Flu A&B, Covid) Nasopharyngeal Swab     Status: None   Collection Time: 11/12/20  2:56 PM   Specimen: Nasopharyngeal Swab; Nasopharyngeal(NP) swabs in vial transport medium  Result Value Ref Range   SARS Coronavirus 2 by RT PCR  NEGATIVE NEGATIVE   Influenza A by PCR NEGATIVE NEGATIVE   Influenza B by PCR NEGATIVE NEGATIVE  Comprehensive metabolic panel     Status: Abnormal   Collection Time: 11/12/20  3:40 PM  Result Value Ref Range   Sodium 134 (L) 135 - 145 mmol/L   Potassium 4.3 3.5 - 5.1 mmol/L   Chloride 99 98 - 111 mmol/L   CO2 28 22 - 32 mmol/L   Glucose, Bld 239 (H) 70 - 99 mg/dL   BUN 26 (H) 6 - 20 mg/dL   Creatinine, Ser 1.84 (H) 0.61 - 1.24 mg/dL   Calcium 8.4 (L) 8.9 - 10.3 mg/dL   Total Protein 7.7 6.5 - 8.1 g/dL   Albumin 2.3 (L) 3.5 - 5.0 g/dL   AST 12 (L) 15 - 41 U/L   ALT 10 0 - 44 U/L   Alkaline Phosphatase 92 38 - 126 U/L   Total Bilirubin 0.5 0.3 - 1.2 mg/dL   GFR, Estimated 45 (L) >60 mL/min   Anion gap 7 5 - 15  CBC WITH DIFFERENTIAL     Status: Abnormal   Collection Time: 11/12/20  3:40 PM  Result Value Ref Range   WBC 17.0 (H) 4.0 - 10.5 K/uL   RBC 3.47 (L) 4.22 - 5.81 MIL/uL   Hemoglobin 9.0 (L) 13.0 - 17.0 g/dL   HCT 29.3 (L) 39.0 - 52.0 %   MCV 84.4 80.0 - 100.0 fL    MCH 25.9 (L) 26.0 - 34.0 pg   MCHC 30.7 30.0 - 36.0 g/dL   RDW 15.9 (H) 11.5 - 15.5 %   Platelets 487 (H) 150 - 400 K/uL   nRBC 0.0 0.0 - 0.2 %   Neutrophils Relative % 89 %   Neutro Abs 15.1 (H) 1.7 - 7.7 K/uL   Lymphocytes Relative 5 %   Lymphs Abs 0.9 0.7 - 4.0 K/uL   Monocytes Relative 5 %   Monocytes Absolute 0.8 0.1 - 1.0 K/uL   Eosinophils Relative 0 %   Eosinophils Absolute 0.0 0.0 - 0.5 K/uL   Basophils Relative 0 %   Basophils Absolute 0.0 0.0 - 0.1 K/uL   Immature Granulocytes 1 %   Abs Immature Granulocytes 0.14 (H) 0.00 - 0.07 K/uL  Protime-INR     Status: Abnormal   Collection Time: 11/12/20  3:40 PM  Result Value Ref Range   Prothrombin Time 15.9 (H) 11.4 - 15.2 seconds   INR 1.3 (H) 0.8 - 1.2  APTT     Status: Abnormal   Collection Time: 11/12/20  3:40 PM  Result Value Ref Range   aPTT 41 (H) 24 - 36 seconds  Lactic acid, plasma     Status: None   Collection Time: 11/12/20  3:45 PM  Result Value Ref Range   Lactic Acid, Venous 0.9 0.5 - 1.9 mmol/L     Radiological Exams on Admission: DG Foot Complete Left  Result Date: 11/12/2020 CLINICAL DATA:  Draining wound about the fifth toe, initial encounter EXAM: LEFT FOOT - COMPLETE 3+ VIEW COMPARISON:  01/04/2004 FINDINGS: Considerable soft tissue swelling is noted with focal soft tissue wound at the fifth MTP joint. Fragmentation and erosive changes of the fifth proximal phalanx are seen. Some erosive change in the head of the fifth metatarsal is noted as well. These changes are consistent with osteomyelitis. Postsurgical changes in medial malleolus are again noted. No other focal abnormality is seen. IMPRESSION: Soft tissue wound with associated changes consistent with osteomyelitis about the fifth  MTP joint. Electronically Signed   By: Inez Catalina M.D.   On: 11/12/2020 15:33   DG Foot Complete Left  Final Result      Consults called:  Ortho   EKG: Independently reviewed. Sinus tach   Dwyane Dee,  MD [facility] 11/12/2020, 5:46 PM

## 2020-11-13 ENCOUNTER — Other Ambulatory Visit: Payer: Self-pay | Admitting: Physician Assistant

## 2020-11-13 DIAGNOSIS — M869 Osteomyelitis, unspecified: Secondary | ICD-10-CM

## 2020-11-13 DIAGNOSIS — Z794 Long term (current) use of insulin: Secondary | ICD-10-CM

## 2020-11-13 DIAGNOSIS — I10 Essential (primary) hypertension: Secondary | ICD-10-CM

## 2020-11-13 DIAGNOSIS — E43 Unspecified severe protein-calorie malnutrition: Secondary | ICD-10-CM

## 2020-11-13 DIAGNOSIS — E1165 Type 2 diabetes mellitus with hyperglycemia: Secondary | ICD-10-CM

## 2020-11-13 LAB — CBC WITH DIFFERENTIAL/PLATELET
Abs Immature Granulocytes: 0.09 10*3/uL — ABNORMAL HIGH (ref 0.00–0.07)
Basophils Absolute: 0 10*3/uL (ref 0.0–0.1)
Basophils Relative: 0 %
Eosinophils Absolute: 0.1 10*3/uL (ref 0.0–0.5)
Eosinophils Relative: 1 %
HCT: 24.5 % — ABNORMAL LOW (ref 39.0–52.0)
Hemoglobin: 7.6 g/dL — ABNORMAL LOW (ref 13.0–17.0)
Immature Granulocytes: 1 %
Lymphocytes Relative: 10 %
Lymphs Abs: 1.6 10*3/uL (ref 0.7–4.0)
MCH: 26 pg (ref 26.0–34.0)
MCHC: 31 g/dL (ref 30.0–36.0)
MCV: 83.9 fL (ref 80.0–100.0)
Monocytes Absolute: 1 10*3/uL (ref 0.1–1.0)
Monocytes Relative: 6 %
Neutro Abs: 12.5 10*3/uL — ABNORMAL HIGH (ref 1.7–7.7)
Neutrophils Relative %: 82 %
Platelets: 429 10*3/uL — ABNORMAL HIGH (ref 150–400)
RBC: 2.92 MIL/uL — ABNORMAL LOW (ref 4.22–5.81)
RDW: 15.8 % — ABNORMAL HIGH (ref 11.5–15.5)
WBC: 15.3 10*3/uL — ABNORMAL HIGH (ref 4.0–10.5)
nRBC: 0 % (ref 0.0–0.2)

## 2020-11-13 LAB — PROTIME-INR
INR: 1.4 — ABNORMAL HIGH (ref 0.8–1.2)
Prothrombin Time: 16.9 seconds — ABNORMAL HIGH (ref 11.4–15.2)

## 2020-11-13 LAB — MAGNESIUM: Magnesium: 1.6 mg/dL — ABNORMAL LOW (ref 1.7–2.4)

## 2020-11-13 LAB — BASIC METABOLIC PANEL
Anion gap: 6 (ref 5–15)
BUN: 24 mg/dL — ABNORMAL HIGH (ref 6–20)
CO2: 26 mmol/L (ref 22–32)
Calcium: 8 mg/dL — ABNORMAL LOW (ref 8.9–10.3)
Chloride: 101 mmol/L (ref 98–111)
Creatinine, Ser: 1.69 mg/dL — ABNORMAL HIGH (ref 0.61–1.24)
GFR, Estimated: 49 mL/min — ABNORMAL LOW (ref >60)
Glucose, Bld: 236 mg/dL — ABNORMAL HIGH (ref 70–99)
Potassium: 4 mmol/L (ref 3.5–5.1)
Sodium: 133 mmol/L — ABNORMAL LOW (ref 135–145)

## 2020-11-13 LAB — GLUCOSE, CAPILLARY
Glucose-Capillary: 227 mg/dL — ABNORMAL HIGH (ref 70–99)
Glucose-Capillary: 182 mg/dL — ABNORMAL HIGH (ref 70–99)
Glucose-Capillary: 210 mg/dL — ABNORMAL HIGH (ref 70–99)
Glucose-Capillary: 140 mg/dL — ABNORMAL HIGH (ref 70–99)

## 2020-11-13 LAB — HEMOGLOBIN A1C
Hgb A1c MFr Bld: 8.3 % — ABNORMAL HIGH (ref 4.8–5.6)
Mean Plasma Glucose: 191.51 mg/dL

## 2020-11-13 LAB — CORTISOL-AM, BLOOD: Cortisol - AM: 8.6 ug/dL (ref 6.7–22.6)

## 2020-11-13 LAB — PROCALCITONIN: Procalcitonin: 0.15 ng/mL

## 2020-11-13 MED ORDER — ATORVASTATIN CALCIUM 10 MG PO TABS
20.0000 mg | ORAL_TABLET | Freq: Every day | ORAL | Status: DC
  Administered 2020-11-13 – 2020-11-15 (×3): 20 mg via ORAL
  Filled 2020-11-13 (×3): qty 2

## 2020-11-13 MED ORDER — MAGNESIUM SULFATE 2 GM/50ML IV SOLN
2.0000 g | Freq: Once | INTRAVENOUS | Status: AC
  Administered 2020-11-13: 2 g via INTRAVENOUS
  Filled 2020-11-13: qty 50

## 2020-11-13 MED ORDER — INSULIN GLARGINE 100 UNIT/ML ~~LOC~~ SOLN
20.0000 [IU] | Freq: Every day | SUBCUTANEOUS | Status: DC
  Administered 2020-11-13 – 2020-11-14 (×2): 20 [IU] via SUBCUTANEOUS
  Filled 2020-11-13 (×3): qty 0.2

## 2020-11-13 MED ORDER — POLYETHYLENE GLYCOL 3350 17 G PO PACK
17.0000 g | PACK | Freq: Every day | ORAL | Status: DC
  Administered 2020-11-13 – 2020-11-15 (×2): 17 g via ORAL
  Filled 2020-11-13 (×2): qty 1

## 2020-11-13 MED ORDER — CEFAZOLIN SODIUM-DEXTROSE 2-4 GM/100ML-% IV SOLN
2.0000 g | INTRAVENOUS | Status: AC
  Administered 2020-11-14: 2 g via INTRAVENOUS
  Filled 2020-11-13 (×2): qty 100

## 2020-11-13 MED ORDER — GABAPENTIN 300 MG PO CAPS
300.0000 mg | ORAL_CAPSULE | Freq: Every day | ORAL | Status: DC
  Administered 2020-11-13 – 2020-11-14 (×2): 300 mg via ORAL
  Filled 2020-11-13 (×2): qty 1

## 2020-11-13 NOTE — Plan of Care (Signed)

## 2020-11-13 NOTE — H&P (View-Only) (Signed)
ORTHOPAEDIC CONSULTATION  REQUESTING PHYSICIAN: Mariel Aloe, MD  Chief Complaint: Abscess ulceration fifth metatarsal head left foot.  HPI: Todd Mccoy is a 50 y.o. male who presents with several week history of ulceration drainage pain and swelling at the fifth metatarsal head left foot.  Patient has a history of diabetes with complications including neuropathy retinal eye damage and hypertension.  He is status post a right second toe amputation with podiatry.  Past Medical History:  Diagnosis Date  . Diabetes mellitus without complication ([place])   . Diabetic neuropathy ([place])   . 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[place]) 03/25/2020   pt states retinal eye damage- left worse than the right- recent visited MD   . 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[place]) 03/25/2020   pt states recent MD visit /left eye worse than right  . Hypertension    Past Surgical History:  Procedure Laterality Date  . ABSCESS DRAINAGE     neck  . AMPUTATION TOE Right 03/25/2020   Procedure: AMPUTATION TOE;  Surgeon: Evelina Bucy, DPM;  Location: [facility];  Service: Podiatry;  Laterality: Right;  . INCISION AND DRAINAGE ABSCESS Right 09/07/2014   Procedure: INCISION AND DRAINAGE ABSCESS RIGHT FLANK;  Surgeon: Jackolyn Confer, MD;  Location: [facility];  Service: General;  Laterality: Right;  . SCROTUM EXPLORATION     Social History   Socioeconomic History  . Marital status: Single    Spouse name: Not on file  . Number of children: Not on file  . Years of education: Not on file  . Highest education level: Not on file  Occupational History  . Not on file  Tobacco Use  . Smoking status: Current Every Day Smoker    Packs/day: 0.50    Types: Cigarettes  . Smokeless tobacco: Never Used  Substance and Sexual Activity  . Alcohol use: No  . Drug use: No  . Sexual activity: Not on file  Other Topics Concern  . Not on file  Social History Narrative  . Not on file   Social Determinants of  Health   Financial Resource Strain: Not on file  Food Insecurity: Not on file  Transportation Needs: Not on file  Physical Activity: Not on file  Stress: Not on file  Social Connections: Not on file   Family History  Problem Relation Age of Onset  . Diabetes Mother   . Diabetes Father   . Diabetes Brother   . Colon cancer Neg Hx   . Esophageal cancer Neg Hx   . Pancreatic cancer Neg Hx   . Stomach cancer Neg Hx   . Liver disease Neg Hx    - negative except otherwise stated in the family history section Allergies  Allergen Reactions  . Amlodipine Swelling    BLE edema   Prior to Admission medications   Medication Sig Start Date End Date Taking? Authorizing Provider  acetaminophen (TYLENOL) 325 MG tablet Take 2 tablets (650 mg total) by mouth every 6 (six) hours as needed for mild pain or fever. 03/27/20  Yes Hongalgi, Lenis Dickinson, MD  atorvastatin (LIPITOR) 20 MG tablet Take 1 tablet (20 mg total) by mouth daily. 10/22/20  Yes [name], MD  doxycycline (VIBRA-TABS) 100 MG tablet Take 100 mg by mouth 2 (two) times daily. 11/07/20  Yes [provider]  gabapentin (NEURONTIN) 300 MG capsule Take 1 capsule (300 mg total) by mouth at bedtime. 10/22/20  Yes [name], MD  insulin glargine (LANTUS)  100 UNIT/ML Solostar Pen Inject 35 Units into the skin at bedtime. Patient taking differently: Inject 35 Units into the skin daily. 10/22/20 10/22/21 Yes [name], Charlane Ferretti, MD  losartan-hydrochlorothiazide (HYZAAR) 100-25 MG tablet TAKE 1 TABLET BY MOUTH DAILY. 10/22/20 10/22/21 Yes [name], Charlane Ferretti, MD  metFORMIN (GLUCOPHAGE-XR) 500 MG 24 hr tablet TAKE 1 TABLET (500 MG TOTAL) BY MOUTH 2 (TWO) TIMES DAILY. 10/22/20 10/22/21 Yes [name], MD  vitamin B-12 (CYANOCOBALAMIN) 500 MCG tablet Take 500 mcg by mouth daily.   Yes [provider]  HYDROcodone-acetaminophen (NORCO/VICODIN) 5-325 MG tablet Take 1 tablet by mouth every 4 (four) hours as needed for moderate pain or  severe pain. Patient not taking: Reported on 11/12/2020 07/15/20   Daleen Bo, MD  Insulin Pen Needle 32G X 4 MM MISC USE TO INJECT INSULIN DAILY. 05/13/20 05/13/21  [name], MD  Insulin Syringe-Needle U-100 (SAFETY INSULIN SYRINGES) 30G X 1/2" 1 ML MISC 35 Units by Does not apply route 2 (two) times daily before a meal. 04/17/20   McClung, Dionne Bucy, PA-C  insulin aspart protamine- aspart (NOVOLOG MIX 70/30) (70-30) 100 UNIT/ML injection Inject 0.35 mLs (35 Units total) into the skin 2 (two) times daily. 04/17/20 05/13/20  Argentina Donovan, PA-C  losartan (COZAAR) 50 MG tablet Take 1 tablet (50 mg total) by mouth daily. 05/13/20 05/23/20  [name], MD   DG Foot Complete Left  Result Date: 11/12/2020 CLINICAL DATA:  Draining wound about the fifth toe, initial encounter EXAM: LEFT FOOT - COMPLETE 3+ VIEW COMPARISON:  01/04/2004 FINDINGS: Considerable soft tissue swelling is noted with focal soft tissue wound at the fifth MTP joint. Fragmentation and erosive changes of the fifth proximal phalanx are seen. Some erosive change in the head of the fifth metatarsal is noted as well. These changes are consistent with osteomyelitis. Postsurgical changes in medial malleolus are again noted. No other focal abnormality is seen. IMPRESSION: Soft tissue wound with associated changes consistent with osteomyelitis about the fifth MTP joint. Electronically Signed   By: Inez Catalina M.D.   On: 11/12/2020 15:33   - pertinent xrays, CT, MRI studies were reviewed and independently interpreted  Positive ROS: All other systems have been reviewed and were otherwise negative with the exception of those mentioned in the HPI and as above.  Physical Exam: General: Alert, no acute distress Psychiatric: Patient is competent for consent with normal mood and affect Lymphatic: No axillary or cervical lymphadenopathy Cardiovascular: No pedal edema Respiratory: No cyanosis, no use of accessory musculature GI: No  organomegaly, abdomen is soft and non-tender    Images:  '@ENCIMAGES'$ @  Labs:  Lab Results  Component Value Date   HGBA1C 8.6 (H) 11/12/2020   HGBA1C 8.0 (A) 10/22/2020   HGBA1C 11.1 (H) 03/24/2020   ESRSEDRATE 126 (H) 03/24/2020   REPTSTATUS 03/30/2020 FINAL 03/24/2020   GRAMSTAIN  11/07/2019    NO WBC SEEN ABUNDANT GRAM POSITIVE COCCI FEW GRAM POSITIVE RODS    CULT  03/24/2020    NO GROWTH 5 DAYS Performed at [facility], [place] [address], [place], [place] [address]    LABORGA STAPHYLOCOCCUS AUREUS 11/07/2019    Lab Results  Component Value Date   ALBUMIN 2.3 (L) 11/12/2020   ALBUMIN 2.4 (L) 04/17/2020   ALBUMIN 2.3 (L) 03/19/2020     CBC EXTENDED Latest Ref Rng & Units 11/13/2020 11/12/2020 05/13/2020  WBC 4.0 - 10.5 K/uL 15.3(H) 17.0(H) 5.4  RBC 4.22 - 5.81 MIL/uL 2.92(L) 3.47(L) 3.43(L)  HGB 13.0 -  17.0 g/dL 7.6(L) 9.0(L) 9.0 Repeated and verified X2.(L)  HCT 39.0 - 52.0 % 24.5(L) 29.3(L) 27.9(L)  PLT 150 - 400 K/uL 429(H) 487(H) 264.0  NEUTROABS 1.7 - 7.7 K/uL 12.5(H) 15.1(H) 3.3  LYMPHSABS 0.7 - 4.0 K/uL 1.6 0.9 1.5    Neurologic: Patient does not have protective sensation bilateral lower extremities.   MUSCULOSKELETAL:   Skin: Examination patient has a necrotic ulcer over the fifth metatarsal head there is exposed bone at the MTP joint.  There is swelling of the left lower extremity without ascending cellulitis.  I cannot palpate a dorsalis pedis pulse however review of the ankle-brachial indices 6 months ago shows normal triphasic flow through the dorsalis pedis and posterior tibial artery.  Patient has severe protein caloric malnutrition with an albumin of 2.3 white cell count initially 17 currently 15.3 with a initial hemoglobin of 9 currently 7.6.  Hemoglobin A1c 8.6.  Review of the radiographs shows destructive bony changes of the MTP joint left little toe.  Assessment: Assessment: Diabetic insensate neuropathy with severe protein caloric  malnutrition with osteomyelitis ulceration abscess left foot fifth metatarsal head.  Plan: Plan: We will plan for left foot fifth ray amputation tomorrow Friday.  Risk and benefits were discussed including risk of the wound not healing the importance of nonweightbearing potential for additional surgery for nonhealing.  Patient states he understands wished to proceed with surgery at this time.  Thank you for the consult and the opportunity to see Todd Mccoy, [place] [phone] 6:38 AM

## 2020-11-13 NOTE — Progress Notes (Signed)
Inpatient Diabetes Program Recommendations  AACE/ADA: New Consensus Statement on Inpatient Glycemic Control (2015)  Target Ranges:  Prepandial:   less than 140 mg/dL      Peak postprandial:   less than 180 mg/dL (1-2 hours)      Critically ill patients:  140 - 180 mg/dL   Lab Results  Component Value Date   GLUCAP 227 (H) 11/13/2020   HGBA1C 8.6 (H) 11/12/2020    Review of Glycemic Control Results for [name], Todd Mccoy (MRN [id number]) as of 11/13/2020 10:29  Ref. Range 11/12/2020 19:54 11/13/2020 06:47  Glucose-Capillary Latest Ref Range: 70 - 99 mg/dL 212 (H) 227 (H)   Diabetes history: DM 2 Outpatient Diabetes medications: Lantus 35 units, Metformin 500 mg bid Current orders for Inpatient glycemic control:  Novolog 0-15 units tid + hs  Inpatient Diabetes Program Recommendations:    -  Add Lantus 15-20 units  Thanks,  Tama Headings RN, MSN, BC-ADM Inpatient Diabetes Coordinator Team Pager [phone] (8a-5p)

## 2020-11-13 NOTE — Consult Note (Signed)
ORTHOPAEDIC CONSULTATION  REQUESTING PHYSICIAN: Mariel Aloe, MD  Chief Complaint: Abscess ulceration fifth metatarsal head left foot.  HPI: Todd Mccoy is a 50 y.o. male who presents with several week history of ulceration drainage pain and swelling at the fifth metatarsal head left foot.  Patient has a history of diabetes with complications including neuropathy retinal eye damage and hypertension.  He is status post a right second toe amputation with podiatry.  Past Medical History:  Diagnosis Date  . Diabetes mellitus without complication ([place])   . Diabetic neuropathy ([place])   . 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[place]) 03/25/2020   pt states retinal eye damage- left worse than the right- recent visited MD   . 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[place]) 03/25/2020   pt states recent MD visit /left eye worse than right  . Hypertension    Past Surgical History:  Procedure Laterality Date  . ABSCESS DRAINAGE     neck  . AMPUTATION TOE Right 03/25/2020   Procedure: AMPUTATION TOE;  Surgeon: Evelina Bucy, DPM;  Location: [facility];  Service: Podiatry;  Laterality: Right;  . INCISION AND DRAINAGE ABSCESS Right 09/07/2014   Procedure: INCISION AND DRAINAGE ABSCESS RIGHT FLANK;  Surgeon: Jackolyn Confer, MD;  Location: [facility];  Service: General;  Laterality: Right;  . SCROTUM EXPLORATION     Social History   Socioeconomic History  . Marital status: Single    Spouse name: Not on file  . Number of children: Not on file  . Years of education: Not on file  . Highest education level: Not on file  Occupational History  . Not on file  Tobacco Use  . Smoking status: Current Every Day Smoker    Packs/day: 0.50    Types: Cigarettes  . Smokeless tobacco: Never Used  Substance and Sexual Activity  . Alcohol use: No  . Drug use: No  . Sexual activity: Not on file  Other Topics Concern  . Not on file  Social History Narrative  . Not on file   Social Determinants of  Health   Financial Resource Strain: Not on file  Food Insecurity: Not on file  Transportation Needs: Not on file  Physical Activity: Not on file  Stress: Not on file  Social Connections: Not on file   Family History  Problem Relation Age of Onset  . Diabetes Mother   . Diabetes Father   . Diabetes Brother   . Colon cancer Neg Hx   . Esophageal cancer Neg Hx   . Pancreatic cancer Neg Hx   . Stomach cancer Neg Hx   . Liver disease Neg Hx    - negative except otherwise stated in the family history section Allergies  Allergen Reactions  . Amlodipine Swelling    BLE edema   Prior to Admission medications   Medication Sig Start Date End Date Taking? Authorizing Provider  acetaminophen (TYLENOL) 325 MG tablet Take 2 tablets (650 mg total) by mouth every 6 (six) hours as needed for mild pain or fever. 03/27/20  Yes Hongalgi, Lenis Dickinson, MD  atorvastatin (LIPITOR) 20 MG tablet Take 1 tablet (20 mg total) by mouth daily. 10/22/20  Yes [name], MD  doxycycline (VIBRA-TABS) 100 MG tablet Take 100 mg by mouth 2 (two) times daily. 11/07/20  Yes [provider]  gabapentin (NEURONTIN) 300 MG capsule Take 1 capsule (300 mg total) by mouth at bedtime. 10/22/20  Yes [name], MD  insulin glargine (LANTUS)  100 UNIT/ML Solostar Pen Inject 35 Units into the skin at bedtime. Patient taking differently: Inject 35 Units into the skin daily. 10/22/20 10/22/21 Yes [name], Charlane Ferretti, MD  losartan-hydrochlorothiazide (HYZAAR) 100-25 MG tablet TAKE 1 TABLET BY MOUTH DAILY. 10/22/20 10/22/21 Yes [name], Charlane Ferretti, MD  metFORMIN (GLUCOPHAGE-XR) 500 MG 24 hr tablet TAKE 1 TABLET (500 MG TOTAL) BY MOUTH 2 (TWO) TIMES DAILY. 10/22/20 10/22/21 Yes [name], MD  vitamin B-12 (CYANOCOBALAMIN) 500 MCG tablet Take 500 mcg by mouth daily.   Yes [provider]  HYDROcodone-acetaminophen (NORCO/VICODIN) 5-325 MG tablet Take 1 tablet by mouth every 4 (four) hours as needed for moderate pain or  severe pain. Patient not taking: Reported on 11/12/2020 07/15/20   Daleen Bo, MD  Insulin Pen Needle 32G X 4 MM MISC USE TO INJECT INSULIN DAILY. 05/13/20 05/13/21  [name], MD  Insulin Syringe-Needle U-100 (SAFETY INSULIN SYRINGES) 30G X 1/2" 1 ML MISC 35 Units by Does not apply route 2 (two) times daily before a meal. 04/17/20   McClung, Dionne Bucy, PA-C  insulin aspart protamine- aspart (NOVOLOG MIX 70/30) (70-30) 100 UNIT/ML injection Inject 0.35 mLs (35 Units total) into the skin 2 (two) times daily. 04/17/20 05/13/20  Argentina Donovan, PA-C  losartan (COZAAR) 50 MG tablet Take 1 tablet (50 mg total) by mouth daily. 05/13/20 05/23/20  [name], MD   DG Foot Complete Left  Result Date: 11/12/2020 CLINICAL DATA:  Draining wound about the fifth toe, initial encounter EXAM: LEFT FOOT - COMPLETE 3+ VIEW COMPARISON:  01/04/2004 FINDINGS: Considerable soft tissue swelling is noted with focal soft tissue wound at the fifth MTP joint. Fragmentation and erosive changes of the fifth proximal phalanx are seen. Some erosive change in the head of the fifth metatarsal is noted as well. These changes are consistent with osteomyelitis. Postsurgical changes in medial malleolus are again noted. No other focal abnormality is seen. IMPRESSION: Soft tissue wound with associated changes consistent with osteomyelitis about the fifth MTP joint. Electronically Signed   By: Inez Catalina M.D.   On: 11/12/2020 15:33   - pertinent xrays, CT, MRI studies were reviewed and independently interpreted  Positive ROS: All other systems have been reviewed and were otherwise negative with the exception of those mentioned in the HPI and as above.  Physical Exam: General: Alert, no acute distress Psychiatric: Patient is competent for consent with normal mood and affect Lymphatic: No axillary or cervical lymphadenopathy Cardiovascular: No pedal edema Respiratory: No cyanosis, no use of accessory musculature GI: No  organomegaly, abdomen is soft and non-tender    Images:  '@ENCIMAGES'$ @  Labs:  Lab Results  Component Value Date   HGBA1C 8.6 (H) 11/12/2020   HGBA1C 8.0 (A) 10/22/2020   HGBA1C 11.1 (H) 03/24/2020   ESRSEDRATE 126 (H) 03/24/2020   REPTSTATUS 03/30/2020 FINAL 03/24/2020   GRAMSTAIN  11/07/2019    NO WBC SEEN ABUNDANT GRAM POSITIVE COCCI FEW GRAM POSITIVE RODS    CULT  03/24/2020    NO GROWTH 5 DAYS Performed at [facility], [place] [address] [place], [place] [address]    LABORGA STAPHYLOCOCCUS AUREUS 11/07/2019    Lab Results  Component Value Date   ALBUMIN 2.3 (L) 11/12/2020   ALBUMIN 2.4 (L) 04/17/2020   ALBUMIN 2.3 (L) 03/19/2020     CBC EXTENDED Latest Ref Rng & Units 11/13/2020 11/12/2020 05/13/2020  WBC 4.0 - 10.5 K/uL 15.3(H) 17.0(H) 5.4  RBC 4.22 - 5.81 MIL/uL 2.92(L) 3.47(L) 3.43(L)  HGB 13.0 -  17.0 g/dL 7.6(L) 9.0(L) 9.0 Repeated and verified X2.(L)  HCT 39.0 - 52.0 % 24.5(L) 29.3(L) 27.9(L)  PLT 150 - 400 K/uL 429(H) 487(H) 264.0  NEUTROABS 1.7 - 7.7 K/uL 12.5(H) 15.1(H) 3.3  LYMPHSABS 0.7 - 4.0 K/uL 1.6 0.9 1.5    Neurologic: Patient does not have protective sensation bilateral lower extremities.   MUSCULOSKELETAL:   Skin: Examination patient has a necrotic ulcer over the fifth metatarsal head there is exposed bone at the MTP joint.  There is swelling of the left lower extremity without ascending cellulitis.  I cannot palpate a dorsalis pedis pulse however review of the ankle-brachial indices 6 months ago shows normal triphasic flow through the dorsalis pedis and posterior tibial artery.  Patient has severe protein caloric malnutrition with an albumin of 2.3 white cell count initially 17 currently 15.3 with a initial hemoglobin of 9 currently 7.6.  Hemoglobin A1c 8.6.  Review of the radiographs shows destructive bony changes of the MTP joint left little toe.  Assessment: Assessment: Diabetic insensate neuropathy with severe protein caloric  malnutrition with osteomyelitis ulceration abscess left foot fifth metatarsal head.  Plan: Plan: We will plan for left foot fifth ray amputation tomorrow Friday.  Risk and benefits were discussed including risk of the wound not healing the importance of nonweightbearing potential for additional surgery for nonhealing.  Patient states he understands wished to proceed with surgery at this time.  Thank you for the consult and the opportunity to see Todd Mccoy, [place] [phone] 6:38 AM

## 2020-11-13 NOTE — Progress Notes (Signed)
PROGRESS NOTE    Brentlee Delage  BCW:[id number] DOB: 09/08/1970 DOA: 11/12/2020 PCP: Patient, No Pcp Per (Inactive)   Brief Narrative: [name] is a 50 y.o. male with history of diabetes mellitus type 2, diabetic neuropathy and retinopathy, hypertension.  Patient presented secondary to worsening left foot wound that was first noticed 2 to 3 weeks ago.  On admission, patient met sepsis criteria.  He was started on empiric antibiotics to cover for diabetic foot wound.  Orthopedic surgery was consulted on admission for management.   Assessment & Plan:   Active Problems:   Sepsis ([place])   Diabetes mellitus with hyperglycemia ([facility])   HTN (hypertension)   Osteomyelitis of fifth toe of left foot ([facility])   Severe protein-calorie malnutrition ([place])   Sepsis Present on admission.  Secondary to osteomyelitis.  Patient started empirically on vancomycin/cefepime/Flagyl.  Blood cultures and urine culture obtained on admission and are pending. -Continue vancomycin, cefepime, Flagyl  Osteomyelitis Suggested diagnoses from foot x-ray.  Orthopedic surgery consulted and are planning left fifth ray amputation.  Antibiotics as mentioned above  Diabetes mellitus, type II Diabetic retinopathy Diabetic neuropathy Patient is managed with insulin.  He is currently on Lantus 35 units daily.  Hemoglobin A1c of 8.6%. -Continue sliding scale insulin -Start Lantus 20 units nightly -Continue home gabapentin  Primary hypertension Patient is on losartan-hydrochlorothiazide as an outpatient.  Antihypertensives held on admission.  Blood pressure currently controlled.  Patient with AKI -Continue hydralazine as needed  AKI on CKD stage II In setting of sepsis.  Baseline creatinine of about 1.2. Creatinine on admission of 1.84.  Creatinine has improved slightly.  Urinalysis significant for significant amount of protein and hyaline casts. -BMP in a.m.   DVT prophylaxis: Heparin subcu Code  Status:   Code Status: Full Code Family Communication: None at bedside Disposition Plan: Discharge home versus SNF pending orthopedic surgery recommendations, antibiotic regimen, PT/OT recommendations   Consultants:   Orthopedic surgery  Procedures:   None  Antimicrobials:  Vancomycin IV  Cefepime IV  Metronidazole IV  Subjective: No concerns today. Pain is mild. Informed of plan for surgery tomorrow by orthopedic surgery.   Objective: Vitals:   11/13/20 0000 11/13/20 0142 11/13/20 0432 11/13/20 0441  BP: 126/81 121/71 115/68 117/81  Pulse: (!) 110 (!) 108 (!) 101 100  Resp: $Remo'19 19 19 19  'crNpc$ Temp: 99.4 F (37.4 C) 99 F (37.2 C) 98.8 F (37.1 C) 98.1 F (36.7 C)  TempSrc: Oral Oral Oral Oral  SpO2: 100% 100% 100% 100%  Weight:        Intake/Output Summary (Last 24 hours) at 11/13/2020 0731 Last data filed at 11/13/2020 0428 Gross per 24 hour  Intake 999.22 ml  Output 600 ml  Net 399.22 ml   Filed Weights   11/12/20 1900  Weight: 89.8 kg    Examination:  General exam: Appears calm and comfortable Respiratory system: Clear to auscultation. Respiratory effort normal. Cardiovascular system: S1 & S2 heard, RRR. No murmurs, rubs, gallops or clicks. Gastrointestinal system: Abdomen is nondistended, soft and nontender. No organomegaly or masses felt. Normal bowel sounds heard. Central nervous system: Alert and oriented. No focal neurological deficits. Musculoskeletal: No edema. No calf tenderness. Left foot in gauze wrapping. Right second toe is amputated. Skin: No cyanosis. No rashes Psychiatry: Judgement and insight appear normal. Mood & affect appropriate.     Data Reviewed: I have personally reviewed following labs and imaging studies  CBC Lab Results  Component Value Date  WBC 15.3 (H) 11/13/2020   RBC 2.92 (L) 11/13/2020   HGB 7.6 (L) 11/13/2020   HCT 24.5 (L) 11/13/2020   MCV 83.9 11/13/2020   MCH 26.0 11/13/2020   PLT 429 (H) 11/13/2020   MCHC  31.0 11/13/2020   RDW 15.8 (H) 11/13/2020   LYMPHSABS 1.6 11/13/2020   MONOABS 1.0 11/13/2020   EOSABS 0.1 11/13/2020   BASOSABS 0.0 16/04/9603     Last metabolic panel Lab Results  Component Value Date   NA 133 (L) 11/13/2020   K 4.0 11/13/2020   CL 101 11/13/2020   CO2 26 11/13/2020   BUN 24 (H) 11/13/2020   CREATININE 1.69 (H) 11/13/2020   GLUCOSE 236 (H) 11/13/2020   GFRNONAA 49 (L) 11/13/2020   GFRAA 76 05/23/2020   CALCIUM 8.0 (L) 11/13/2020   PHOS 2.4 09/07/2014   PROT 7.7 11/12/2020   ALBUMIN 2.3 (L) 11/12/2020   LABGLOB 3.5 04/17/2020   AGRATIO 0.7 (L) 04/17/2020   BILITOT 0.5 11/12/2020   ALKPHOS 92 11/12/2020   AST 12 (L) 11/12/2020   ALT 10 11/12/2020   ANIONGAP 6 11/13/2020    CBG (last 3)  Recent Labs    11/12/20 1954 11/13/20 0647  GLUCAP 212* 227*     GFR: Estimated Creatinine Clearance: 60.3 mL/min (A) (by C-G formula based on SCr of 1.69 mg/dL (H)).  Coagulation Profile: Recent Labs  Lab 11/12/20 1540 11/13/20 0222  INR 1.3* 1.4*    Recent Results (from the past 240 hour(s))  Resp Panel by RT-PCR (Flu A&B, Covid) Nasopharyngeal Swab     Status: None   Collection Time: 11/12/20  2:56 PM   Specimen: Nasopharyngeal Swab; Nasopharyngeal(NP) swabs in vial transport medium  Result Value Ref Range Status   SARS Coronavirus 2 by RT PCR NEGATIVE NEGATIVE Final    Comment: (NOTE) SARS-CoV-2 target nucleic acids are NOT DETECTED.  The SARS-CoV-2 RNA is generally detectable in upper respiratory specimens during the acute phase of infection. The lowest concentration of SARS-CoV-2 viral copies this assay can detect is 138 copies/mL. A negative result does not preclude SARS-Cov-2 infection and should not be used as the sole basis for treatment or other patient management decisions. A negative result may occur with  improper specimen collection/handling, submission of specimen other than nasopharyngeal swab, presence of viral mutation(s) within  the areas targeted by this assay, and inadequate number of viral copies(<138 copies/mL). A negative result must be combined with clinical observations, patient history, and epidemiological information. The expected result is Negative.  Fact Sheet for Patients:  EntrepreneurPulse.com.au  Fact Sheet for Healthcare Providers:  IncredibleEmployment.be  This test is no t yet approved or cleared by the [place] FDA and  has been authorized for detection and/or diagnosis of SARS-CoV-2 by FDA under an Emergency Use Authorization (EUA). This EUA will remain  in effect (meaning this test can be used) for the duration of the COVID-19 declaration under Section 564(b)(1) of the Act, 21 U.S.C.section 360bbb-3(b)(1), unless the authorization is terminated  or revoked sooner.       Influenza A by PCR NEGATIVE NEGATIVE Final   Influenza B by PCR NEGATIVE NEGATIVE Final    Comment: (NOTE) The Xpert Xpress SARS-CoV-2/FLU/RSV plus assay is intended as an aid in the diagnosis of influenza from Nasopharyngeal swab specimens and should not be used as a sole basis for treatment. Nasal washings and aspirates are unacceptable for Xpert Xpress SARS-CoV-2/FLU/RSV testing.  Fact Sheet for Patients: EntrepreneurPulse.com.au  Fact Sheet for Healthcare Providers:  IncredibleEmployment.be  This test is not yet approved or cleared by the [place] and has been authorized for detection and/or diagnosis of SARS-CoV-2 by FDA under an Emergency Use Authorization (EUA). This EUA will remain in effect (meaning this test can be used) for the duration of the COVID-19 declaration under Section 564(b)(1) of the Act, 21 U.S.C. section 360bbb-3(b)(1), unless the authorization is terminated or revoked.  Performed at [facility], [place] [address] [place], [place] [address]         Radiology Studies: DG Foot  Complete Left  Result Date: 11/12/2020 CLINICAL DATA:  Draining wound about the fifth toe, initial encounter EXAM: LEFT FOOT - COMPLETE 3+ VIEW COMPARISON:  01/04/2004 FINDINGS: Considerable soft tissue swelling is noted with focal soft tissue wound at the fifth MTP joint. Fragmentation and erosive changes of the fifth proximal phalanx are seen. Some erosive change in the head of the fifth metatarsal is noted as well. These changes are consistent with osteomyelitis. Postsurgical changes in medial malleolus are again noted. No other focal abnormality is seen. IMPRESSION: Soft tissue wound with associated changes consistent with osteomyelitis about the fifth MTP joint. Electronically Signed   By: Inez Catalina M.D.   On: 11/12/2020 15:33        Scheduled Meds: . heparin  5,000 Units Subcutaneous Q8H  . insulin aspart  0-15 Units Subcutaneous TID WC  . insulin aspart  0-5 Units Subcutaneous QHS   Continuous Infusions: . ceFEPime (MAXIPIME) IV 2 g (11/13/20 0138)  . metronidazole 500 mg (11/13/20 0428)  . vancomycin       LOS: 1 day     Cordelia Poche, MD [facility] 11/13/2020, 7:31 AM  If 7PM-7AM, please contact night-coverage [email or web address]

## 2020-11-14 ENCOUNTER — Inpatient Hospital Stay (HOSPITAL_COMMUNITY): Payer: Medicaid Other | Admitting: Anesthesiology

## 2020-11-14 ENCOUNTER — Encounter (HOSPITAL_COMMUNITY)
Admission: EM | Disposition: A | Discharge status: Home Health | Payer: Self-pay | Source: Home / Self Care | Attending: Internal Medicine

## 2020-11-14 ENCOUNTER — Encounter (HOSPITAL_COMMUNITY): Payer: Self-pay | Admitting: Internal Medicine

## 2020-11-14 HISTORY — PX: AMPUTATION: SHX166

## 2020-11-14 LAB — GLUCOSE, CAPILLARY
Glucose-Capillary: 195 mg/dL — ABNORMAL HIGH (ref 70–99)
Glucose-Capillary: 160 mg/dL — ABNORMAL HIGH (ref 70–99)
Glucose-Capillary: 127 mg/dL — ABNORMAL HIGH (ref 70–99)
Glucose-Capillary: 100 mg/dL — ABNORMAL HIGH (ref 70–99)
Glucose-Capillary: 119 mg/dL — ABNORMAL HIGH (ref 70–99)
Glucose-Capillary: 155 mg/dL — ABNORMAL HIGH (ref 70–99)
Glucose-Capillary: 147 mg/dL — ABNORMAL HIGH (ref 70–99)

## 2020-11-14 LAB — MRSA PCR SCREENING: MRSA by PCR: NEGATIVE

## 2020-11-14 LAB — CBC
HCT: 24.8 % — ABNORMAL LOW (ref 39.0–52.0)
Hemoglobin: 7.7 g/dL — ABNORMAL LOW (ref 13.0–17.0)
MCH: 25.8 pg — ABNORMAL LOW (ref 26.0–34.0)
MCHC: 31 g/dL (ref 30.0–36.0)
MCV: 82.9 fL (ref 80.0–100.0)
Platelets: 429 10*3/uL — ABNORMAL HIGH (ref 150–400)
RBC: 2.99 MIL/uL — ABNORMAL LOW (ref 4.22–5.81)
RDW: 15.9 % — ABNORMAL HIGH (ref 11.5–15.5)
WBC: 12.9 10*3/uL — ABNORMAL HIGH (ref 4.0–10.5)
nRBC: 0 % (ref 0.0–0.2)

## 2020-11-14 LAB — BASIC METABOLIC PANEL
Anion gap: 7 (ref 5–15)
BUN: 27 mg/dL — ABNORMAL HIGH (ref 6–20)
CO2: 24 mmol/L (ref 22–32)
Calcium: 8.1 mg/dL — ABNORMAL LOW (ref 8.9–10.3)
Chloride: 101 mmol/L (ref 98–111)
Creatinine, Ser: 1.54 mg/dL — ABNORMAL HIGH (ref 0.61–1.24)
GFR, Estimated: 55 mL/min — ABNORMAL LOW (ref >60)
Glucose, Bld: 200 mg/dL — ABNORMAL HIGH (ref 70–99)
Potassium: 4.2 mmol/L (ref 3.5–5.1)
Sodium: 132 mmol/L — ABNORMAL LOW (ref 135–145)

## 2020-11-14 LAB — URINE CULTURE: Culture: NO GROWTH

## 2020-11-14 SURGERY — AMPUTATION, FOOT, RAY
Anesthesia: Monitor Anesthesia Care | Site: Toe | Laterality: Left

## 2020-11-14 MED ORDER — PROPOFOL 10 MG/ML IV BOLUS
INTRAVENOUS | Status: AC
  Filled 2020-11-14: qty 40

## 2020-11-14 MED ORDER — CHLORHEXIDINE GLUCONATE 0.12 % MT SOLN
15.0000 mL | Freq: Once | OROMUCOSAL | Status: AC
  Administered 2020-11-14: 15 mL via OROMUCOSAL

## 2020-11-14 MED ORDER — PROPOFOL 10 MG/ML IV BOLUS
INTRAVENOUS | Status: DC | PRN
  Administered 2020-11-14 (×3): 10 mg via INTRAVENOUS
  Administered 2020-11-14 (×2): 20 mg via INTRAVENOUS
  Administered 2020-11-14 (×7): 10 mg via INTRAVENOUS

## 2020-11-14 MED ORDER — OXYCODONE HCL 5 MG/5ML PO SOLN: 5.0000 mg | Freq: Once | ORAL | Status: DC | PRN

## 2020-11-14 MED ORDER — DOCUSATE SODIUM 100 MG PO CAPS
100.0000 mg | ORAL_CAPSULE | Freq: Every day | ORAL | Status: DC
  Administered 2020-11-15: 100 mg via ORAL
  Filled 2020-11-14: qty 1

## 2020-11-14 MED ORDER — OXYCODONE HCL 5 MG PO TABS: 5.0000 mg | ORAL_TABLET | Freq: Once | ORAL | Status: DC | PRN

## 2020-11-14 MED ORDER — FENTANYL CITRATE (PF) 100 MCG/2ML IJ SOLN
50.0000 ug | Freq: Once | INTRAMUSCULAR | Status: AC
Start: 2020-11-14 — End: 2020-11-14

## 2020-11-14 MED ORDER — ONDANSETRON HCL 4 MG/2ML IJ SOLN: 4.0000 mg | Freq: Four times a day (QID) | INTRAMUSCULAR | Status: DC | PRN

## 2020-11-14 MED ORDER — MIDAZOLAM HCL 2 MG/2ML IJ SOLN
INTRAMUSCULAR | Status: AC
  Filled 2020-11-14: qty 2

## 2020-11-14 MED ORDER — ROPIVACAINE HCL 5 MG/ML IJ SOLN
INTRAMUSCULAR | Status: DC | PRN
  Administered 2020-11-14: 25 mL via PERINEURAL

## 2020-11-14 MED ORDER — LACTATED RINGERS IV SOLN: INTRAVENOUS | Status: DC | PRN

## 2020-11-14 MED ORDER — HEPARIN SODIUM (PORCINE) 5000 UNIT/ML IJ SOLN: 5000.0000 [IU] | Freq: Three times a day (TID) | INTRAMUSCULAR | Status: DC

## 2020-11-14 MED ORDER — SODIUM CHLORIDE 0.9 % IV SOLN: INTRAVENOUS | Status: DC

## 2020-11-14 MED ORDER — HYDROMORPHONE HCL 1 MG/ML IJ SOLN: 0.5000 mg | INTRAMUSCULAR | Status: DC | PRN

## 2020-11-14 MED ORDER — GUAIFENESIN-DM 100-10 MG/5ML PO SYRP: 15.0000 mL | ORAL_SOLUTION | ORAL | Status: DC | PRN

## 2020-11-14 MED ORDER — ALUM & MAG HYDROXIDE-SIMETH 200-200-20 MG/5ML PO SUSP: 15.0000 mL | ORAL | Status: DC | PRN

## 2020-11-14 MED ORDER — MAGNESIUM CITRATE PO SOLN: 1.0000 | Freq: Once | ORAL | Status: DC | PRN

## 2020-11-14 MED ORDER — 0.9 % SODIUM CHLORIDE (POUR BTL) OPTIME
TOPICAL | Status: DC | PRN
  Administered 2020-11-14: 1000 mL

## 2020-11-14 MED ORDER — ASCORBIC ACID 500 MG PO TABS
1000.0000 mg | ORAL_TABLET | Freq: Every day | ORAL | Status: DC
  Administered 2020-11-14 – 2020-11-15 (×2): 1000 mg via ORAL
  Filled 2020-11-14 (×2): qty 2

## 2020-11-14 MED ORDER — FENTANYL CITRATE (PF) 100 MCG/2ML IJ SOLN
INTRAMUSCULAR | Status: AC
  Administered 2020-11-14: 50 ug via INTRAVENOUS
  Filled 2020-11-14: qty 2

## 2020-11-14 MED ORDER — PHENOL 1.4 % MT LIQD: 1.0000 | OROMUCOSAL | Status: DC | PRN

## 2020-11-14 MED ORDER — FENTANYL CITRATE (PF) 100 MCG/2ML IJ SOLN: 25.0000 ug | INTRAMUSCULAR | Status: DC | PRN

## 2020-11-14 MED ORDER — PANTOPRAZOLE SODIUM 40 MG PO TBEC
40.0000 mg | DELAYED_RELEASE_TABLET | Freq: Every day | ORAL | Status: DC
  Administered 2020-11-14 – 2020-11-15 (×2): 40 mg via ORAL
  Filled 2020-11-14 (×2): qty 1

## 2020-11-14 MED ORDER — LIDOCAINE 2% (20 MG/ML) 5 ML SYRINGE
INTRAMUSCULAR | Status: AC
  Filled 2020-11-14: qty 5

## 2020-11-14 MED ORDER — LACTATED RINGERS IV SOLN: INTRAVENOUS | Status: DC

## 2020-11-14 MED ORDER — CHLORHEXIDINE GLUCONATE 0.12 % MT SOLN
OROMUCOSAL | Status: AC
  Filled 2020-11-14: qty 15

## 2020-11-14 MED ORDER — ORAL CARE MOUTH RINSE
15.0000 mL | Freq: Once | OROMUCOSAL | Status: AC
Start: 2020-11-14 — End: 2020-11-14

## 2020-11-14 MED ORDER — POLYVINYL ALCOHOL 1.4 % OP SOLN
1.0000 [drp] | OPHTHALMIC | Status: DC | PRN
  Filled 2020-11-14: qty 15

## 2020-11-14 MED ORDER — LIDOCAINE 2% (20 MG/ML) 5 ML SYRINGE
INTRAMUSCULAR | Status: DC | PRN
  Administered 2020-11-14: 20 mg via INTRAVENOUS

## 2020-11-14 MED ORDER — ZINC SULFATE 220 (50 ZN) MG PO CAPS
220.0000 mg | ORAL_CAPSULE | Freq: Every day | ORAL | Status: DC
  Administered 2020-11-14 – 2020-11-15 (×2): 220 mg via ORAL
  Filled 2020-11-14 (×2): qty 1

## 2020-11-14 SURGICAL SUPPLY — 31 items
BLADE SAW SGTL MED 73X18.5 STR (BLADE) IMPLANT
BLADE SURG 21 STRL SS (BLADE) ×2 IMPLANT
BNDG COHESIVE 4X5 TAN STRL (GAUZE/BANDAGES/DRESSINGS) ×2 IMPLANT
BNDG GAUZE ELAST 4 BULKY (GAUZE/BANDAGES/DRESSINGS) ×2 IMPLANT
COVER SURGICAL LIGHT HANDLE (MISCELLANEOUS) ×4 IMPLANT
COVER WAND RF STERILE (DRAPES) ×2 IMPLANT
DRAPE U-SHAPE 47X51 STRL (DRAPES) ×4 IMPLANT
DRSG ADAPTIC 3X8 NADH LF (GAUZE/BANDAGES/DRESSINGS) ×2 IMPLANT
DRSG PAD ABDOMINAL 8X10 ST (GAUZE/BANDAGES/DRESSINGS) ×4 IMPLANT
DURAPREP 26ML APPLICATOR (WOUND CARE) ×2 IMPLANT
ELECT REM PT RETURN 9FT ADLT (ELECTROSURGICAL) ×2
ELECTRODE REM PT RTRN 9FT ADLT (ELECTROSURGICAL) ×1 IMPLANT
GAUZE SPONGE 4X4 12PLY STRL (GAUZE/BANDAGES/DRESSINGS) ×2 IMPLANT
GLOVE BIOGEL PI IND STRL 9 (GLOVE) ×1 IMPLANT
GLOVE BIOGEL PI INDICATOR 9 (GLOVE) ×1
GLOVE SURG ORTHO 9.0 STRL STRW (GLOVE) ×2 IMPLANT
GOWN STRL REUS W/ TWL XL LVL3 (GOWN DISPOSABLE) ×2 IMPLANT
GOWN STRL REUS W/TWL XL LVL3 (GOWN DISPOSABLE) ×4
KIT BASIN OR (CUSTOM PROCEDURE TRAY) ×2 IMPLANT
KIT DRSG PREVENA PLUS 7DAY 125 (MISCELLANEOUS) ×1 IMPLANT
KIT TURNOVER KIT B (KITS) ×2 IMPLANT
MICROMATRIX 1000MG (Tissue) ×2 IMPLANT
NS IRRIG 1000ML POUR BTL (IV SOLUTION) ×2 IMPLANT
PACK ORTHO EXTREMITY (CUSTOM PROCEDURE TRAY) ×2 IMPLANT
PAD ARMBOARD 7.5X6 YLW CONV (MISCELLANEOUS) ×4 IMPLANT
SOLUTION PARTIC MCRMTRX 1000MG (Tissue) IMPLANT
STOCKINETTE IMPERVIOUS LG (DRAPES) IMPLANT
SUT ETHILON 2 0 PSLX (SUTURE) ×4 IMPLANT
TOWEL GREEN STERILE (TOWEL DISPOSABLE) ×2 IMPLANT
TUBE CONNECTING 12X1/4 (SUCTIONS) ×2 IMPLANT
YANKAUER SUCT BULB TIP NO VENT (SUCTIONS) ×2 IMPLANT

## 2020-11-14 NOTE — Transfer of Care (Signed)
Immediate Anesthesia Transfer of Care Note  Patient: Todd Mccoy  Procedure(s) Performed: LEFT 5TH RAY AMPUTATION (Left Toe)  Patient Location: PACU  Anesthesia Type:MAC and Regional  Level of Consciousness: awake, alert  and oriented  Airway & Oxygen Therapy: Patient Spontanous Breathing  Post-op Assessment: Report given to RN and Post -op Vital signs reviewed and stable  Post vital signs: Reviewed and stable  Last Vitals:  Vitals Value Taken Time  BP 123/87 11/14/20 1131  Temp    Pulse 92 11/14/20 1133  Resp 22 11/14/20 1133  SpO2 99 % 11/14/20 1133  Vitals shown include unvalidated device data.  Last Pain:  Vitals:   11/14/20 0700  TempSrc: Oral  PainSc:       Patients Stated Pain Goal: 3 (32/76/14 7092)  Complications: No complications documented.

## 2020-11-14 NOTE — Interval H&P Note (Signed)
History and Physical Interval Note:  11/14/2020 6:49 AM  Todd Mccoy  has presented today for surgery, with the diagnosis of Osteomyelitis Left 5th Toe.  The various methods of treatment have been discussed with the patient and family. After consideration of risks, benefits and other options for treatment, the patient has consented to  Procedure(s): LEFT 5TH RAY AMPUTATION (Left) as a surgical intervention.  The patient's history has been reviewed, patient examined, no change in status, stable for surgery.  I have reviewed the patient's chart and labs.  Questions were answered to the patient's satisfaction.     Newt Minion

## 2020-11-14 NOTE — Anesthesia Preprocedure Evaluation (Signed)
Anesthesia Evaluation  Patient identified by MRN, date of birth, ID band Patient awake    Reviewed: Allergy & Precautions, H&P , NPO status , Patient's Chart, lab work & pertinent test results  Airway Mallampati: II   Neck ROM: full    Dental   Pulmonary Current Smoker and Patient abstained from smoking.,    breath sounds clear to auscultation       Cardiovascular hypertension,  Rhythm:regular Rate:Normal     Neuro/Psych    GI/Hepatic   Endo/Other  diabetes, Type 2  Renal/GU Renal InsufficiencyRenal disease     Musculoskeletal osteomyelitis   Abdominal   Peds  Hematology  (+) anemia ,   Anesthesia Other Findings   Reproductive/Obstetrics                             Anesthesia Physical Anesthesia Plan  ASA: III  Anesthesia Plan: MAC and Regional   Post-op Pain Management:    Induction: Intravenous  PONV Risk Score and Plan: 0 and Propofol infusion, Treatment may vary due to age or medical condition, Midazolam and Ondansetron  Airway Management Planned: Simple Face Mask  Additional Equipment:   Intra-op Plan:   Post-operative Plan:   Informed Consent: I have reviewed the patients History and Physical, chart, labs and discussed the procedure including the risks, benefits and alternatives for the proposed anesthesia with the patient or authorized representative who has indicated his/her understanding and acceptance.     Dental advisory given  Plan Discussed with: CRNA, Anesthesiologist and Surgeon  Anesthesia Plan Comments:         Anesthesia Quick Evaluation

## 2020-11-14 NOTE — Anesthesia Procedure Notes (Signed)
Anesthesia Regional Block: Popliteal block   Pre-Anesthetic Checklist: ,, timeout performed, Correct Patient, Correct Site, Correct Laterality, Correct Procedure, Correct Position, site marked, Risks and benefits discussed,  Surgical consent,  Pre-op evaluation,  At surgeon's request and post-op pain management  Laterality: Left  Prep: chloraprep       Needles:  Injection technique: Single-shot  Needle Type: Echogenic Stimulator Needle          Additional Needles:   Procedures:, nerve stimulator,,,,,,,   Nerve Stimulator or Paresthesia:  Response: plantar flexion of foot, 0.45 mA,   Additional Responses:   Narrative:  Start time: 11/14/2020 10:11 AM End time: 11/14/2020 10:22 AM Injection made incrementally with aspirations every 5 mL.  Performed by: Personally  Anesthesiologist: Albertha Ghee, MD  Additional Notes: Functioning IV was confirmed and monitors were applied.  A 64m 21ga Arrow echogenic stimulator needle was used. Sterile prep and drape,hand hygiene and sterile gloves were used.  Negative aspiration and negative test dose prior to incremental administration of local anesthetic. The patient tolerated the procedure well.  Ultrasound guidance: relevent anatomy identified, needle position confirmed, local anesthetic spread visualized around nerve(s), vascular puncture avoided.  Image printed for medical record.

## 2020-11-14 NOTE — Progress Notes (Signed)
Pt provina wound vac was beeping and the light was on indicating the seal was not intact. Wound vac dressing was reinforced with ace wrap, no koban on floorstock. After securing seal, the beeping stopped and the light went away. Will continue to monitor.

## 2020-11-14 NOTE — Op Note (Signed)
11/14/2020  11:37 AM  PATIENT:  Todd Mccoy    PRE-OPERATIVE DIAGNOSIS:  Osteomyelitis Left 5th Toe  POST-OPERATIVE DIAGNOSIS:  Same  PROCEDURE:  LEFT 5TH RAY AMPUTATION Local tissue rearrangement [id number] cm Application a-cell powder 1 gram Application prevena vac 13 cm  SURGEON:  Newt Minion, MD  PHYSICIAN ASSISTANT:None ANESTHESIA:   General  PREOPERATIVE INDICATIONS:  Todd Mccoy is a  50 y.o. male with a diagnosis of Osteomyelitis Left 5th Toe who failed conservative measures and elected for surgical management.    The risks benefits and alternatives were discussed with the patient preoperatively including but not limited to the risks of infection, bleeding, nerve injury, cardiopulmonary complications, the need for revision surgery, among others, and the patient was willing to proceed.  OPERATIVE IMPLANTS: none  '@ENCIMAGES'$ @  OPERATIVE FINDINGS: The tissue margins were clear.  OPERATIVE PROCEDURE: Patient was brought the operating room after undergoing a regional anesthetic.  After adequate levels anesthesia were obtained patient's left lower extremity was prepped using DuraPrep draped into a sterile field a timeout was called.  Elliptical incision was made around the dorsal and plantar ulcer through clean tissue margins this left a wound that was 10 x 5 cm.  The fifth metatarsal was resected through the base.  Electrocautery was used for hemostasis the wound was irrigated with normal saline.  Local tissue rearrangement was used to close the wound 10 x 5 cm.  This was gapped open about 5 mm and 1 g of ACell powder was applied to the wound.  This was covered with a 13 cm Prevena wound VAC this was outlined with derma tack this had a good suction fit patient underwent Coban wrap from the metatarsal heads through the tibial tubercle.  The wound VAC had a good suction fit patient was taken the PACU in stable condition.   DISCHARGE PLANNING:  Antibiotic duration:  Continue IV antibiotics for 24 hours  Weightbearing: Nonweightbearing on the left  Pain medication: Opioid pathway  Dressing care/ Wound VAC: Continue wound VAC for 1 week  Ambulatory devices: Crutches or walker or wheelchair  Discharge to: Discharge planning based on physical therapy recommendations.  Follow-up: In the office 1 week post operative.

## 2020-11-14 NOTE — Plan of Care (Signed)

## 2020-11-14 NOTE — Progress Notes (Signed)
PROGRESS NOTE    Todd Mccoy  FEO:[id number] DOB: August 21, 1970 DOA: 11/12/2020 PCP: Patient, No Pcp Per (Inactive)   Brief Narrative: Todd Mccoy is a 50 y.o. male with history of diabetes mellitus type 2, diabetic neuropathy and retinopathy, hypertension.  Patient presented secondary to worsening left foot wound that was first noticed 2 to 3 weeks ago.  On admission, patient met sepsis criteria.  He was started on empiric antibiotics to cover for diabetic foot wound.  Orthopedic surgery was consulted on admission for management.  11/14/2020: Patient underwent left fifth toe ray amputation.  Orthopedic input is appreciated.   Assessment & Plan:   Active Problems:   Sepsis ([place])   Diabetes mellitus with hyperglycemia ([facility])   HTN (hypertension)   Osteomyelitis of fifth toe of left foot ([facility])   Severe protein-calorie malnutrition ([place])   Sepsis Present on admission.  Secondary to osteomyelitis.  Patient started empirically on vancomycin/cefepime/Flagyl.  Blood cultures and urine culture obtained on admission and are pending. -Continue vancomycin, cefepime, Flagyl 11/14/2020: Sepsis physiology has resolved.  Antibiotics will be continued for the next 24 hours as per orthopedic surgery team.  Will consult physical therapy.  Disposition as per physical therapist recommendation.  Osteomyelitis Suggested diagnoses from foot x-ray.  Orthopedic surgery consulted and are planning left fifth ray amputation.  Antibiotics as mentioned above 11/14/2020: Patient underwent surgery today.  See above documentation.  Diabetes mellitus, type II Diabetic retinopathy Diabetic neuropathy Patient is managed with insulin.  He is currently on Lantus 35 units daily.  Hemoglobin A1c of 8.6%. -Continue sliding scale insulin -Start Lantus 20 units nightly -Continue home gabapentin  Primary hypertension Patient is on losartan-hydrochlorothiazide as an outpatient.  Antihypertensives held on  admission.  Blood pressure currently controlled.  Patient with AKI -Continue hydralazine as needed 11/14/2020: Goal blood pressure should be less than 130/80 mmHg.  Continue to optimize.  AKI on CKD stage II In setting of sepsis.  Baseline creatinine of about 1.2. Creatinine on admission of 1.84.  Creatinine has improved slightly.  Urinalysis significant for significant amount of protein and hyaline casts. -BMP in a.m. 11/14/2020: AKI is slowly resolving.   DVT prophylaxis: Heparin subcu Code Status:   Code Status: Full Code Family Communication: None at bedside Disposition Plan: Discharge home versus SNF pending orthopedic surgery recommendations, antibiotic regimen, PT/OT recommendations   Consultants:   Orthopedic surgery  Procedures:   S/p left fifth toe ray amputation on 11/14/2020.  Antimicrobials:  Vancomycin IV  Cefepime IV  Metronidazole IV  Subjective: No new complaints. No fever or chills.   Pain is controlled.  Objective: Vitals:   11/14/20 1020 11/14/20 1130 11/14/20 1145 11/14/20 1500  BP: (!) 184/104 123/87 138/87 (!) 147/85  Pulse: (!) 106 93 90 (!) 110  Resp: (!) _0 Temp:  98.5 F (36.9 C) 97.6 F (36.4 C) 99.2 F (37.3 C)  TempSrc:    Oral  SpO2: 98% 99% 99% 100%  Weight:        Intake/Output Summary (Last 24 hours) at 11/14/2020 1526 Last data filed at 11/14/2020 1511 Gross per 24 hour  Intake 1814.13 ml  Output 1710 ml  Net 104.13 ml   Filed Weights   11/12/20 1900  Weight: 89.8 kg    Examination:  General exam: Appears calm and comfortable Respiratory system: Clear to auscultation. Respiratory effort normal. Cardiovascular system: S1 & S2 heard. Gastrointestinal system: Abdomen is nondistended, soft and nontender. No organomegaly or masses felt. Normal  bowel sounds heard. Central nervous system: Alert and oriented. No focal neurological deficits. Musculoskeletal: Left lower leg is dressed.  Data Reviewed: I have  personally reviewed following labs and imaging studies  CBC Lab Results  Component Value Date   WBC 12.9 (H) 11/14/2020   RBC 2.99 (L) 11/14/2020   HGB 7.7 (L) 11/14/2020   HCT 24.8 (L) 11/14/2020   MCV 82.9 11/14/2020   MCH 25.8 (L) 11/14/2020   PLT 429 (H) 11/14/2020   MCHC 31.0 11/14/2020   RDW 15.9 (H) 11/14/2020   LYMPHSABS 1.6 11/13/2020   MONOABS 1.0 11/13/2020   EOSABS 0.1 11/13/2020   BASOSABS 0.0 95/18/8416     Last metabolic panel Lab Results  Component Value Date   NA 132 (L) 11/14/2020   K 4.2 11/14/2020   CL 101 11/14/2020   CO2 24 11/14/2020   BUN 27 (H) 11/14/2020   CREATININE 1.54 (H) 11/14/2020   GLUCOSE 200 (H) 11/14/2020   GFRNONAA 55 (L) 11/14/2020   GFRAA 76 05/23/2020   CALCIUM 8.1 (L) 11/14/2020   PHOS 2.4 09/07/2014   PROT 7.7 11/12/2020   ALBUMIN 2.3 (L) 11/12/2020   LABGLOB 3.5 04/17/2020   AGRATIO 0.7 (L) 04/17/2020   BILITOT 0.5 11/12/2020   ALKPHOS 92 11/12/2020   AST 12 (L) 11/12/2020   ALT 10 11/12/2020   ANIONGAP 7 11/14/2020    CBG (last 3)  Recent Labs    11/14/20 1130 11/14/20 1200 11/14/20 1512  GLUCAP 119* 100* 155*     GFR: Estimated Creatinine Clearance: 66.1 mL/min (A) (by C-G formula based on SCr of 1.54 mg/dL (H)).  Coagulation Profile: Recent Labs  Lab 11/12/20 1540 11/13/20 0222  INR 1.3* 1.4*    Recent Results (from the past 240 hour(s))  Resp Panel by RT-PCR (Flu A&B, Covid) Nasopharyngeal Swab     Status: None   Collection Time: 11/12/20  2:56 PM   Specimen: Nasopharyngeal Swab; Nasopharyngeal(NP) swabs in vial transport medium  Result Value Ref Range Status   SARS Coronavirus 2 by RT PCR NEGATIVE NEGATIVE Final    Comment: (NOTE) SARS-CoV-2 target nucleic acids are NOT DETECTED.  The SARS-CoV-2 RNA is generally detectable in upper respiratory specimens during the acute phase of infection. The lowest concentration of SARS-CoV-2 viral copies this assay can detect is 138 copies/mL. A negative  result does not preclude SARS-Cov-2 infection and should not be used as the sole basis for treatment or other patient management decisions. A negative result may occur with  improper specimen collection/handling, submission of specimen other than nasopharyngeal swab, presence of viral mutation(s) within the areas targeted by this assay, and inadequate number of viral copies(<138 copies/mL). A negative result must be combined with clinical observations, patient history, and epidemiological information. The expected result is Negative.  Fact Sheet for Patients:  EntrepreneurPulse.com.au  Fact Sheet for Healthcare Providers:  IncredibleEmployment.be  This test is no t yet approved or cleared by the [place] FDA and  has been authorized for detection and/or diagnosis of SARS-CoV-2 by FDA under an Emergency Use Authorization (EUA). This EUA will remain  in effect (meaning this test can be used) for the duration of the COVID-19 declaration under Section 564(b)(1) of the Act, 21 U.S.C.section 360bbb-3(b)(1), unless the authorization is terminated  or revoked sooner.       Influenza A by PCR NEGATIVE NEGATIVE Final   Influenza B by PCR NEGATIVE NEGATIVE Final    Comment: (NOTE) The Xpert Xpress SARS-CoV-2/FLU/RSV plus assay is intended as  an aid in the diagnosis of influenza from Nasopharyngeal swab specimens and should not be used as a sole basis for treatment. Nasal washings and aspirates are unacceptable for Xpert Xpress SARS-CoV-2/FLU/RSV testing.  Fact Sheet for Patients: EntrepreneurPulse.com.au  Fact Sheet for Healthcare Providers: IncredibleEmployment.be  This test is not yet approved or cleared by the [place] FDA and has been authorized for detection and/or diagnosis of SARS-CoV-2 by FDA under an Emergency Use Authorization (EUA). This EUA will remain in effect (meaning this test can be used)  for the duration of the COVID-19 declaration under Section 564(b)(1) of the Act, 21 U.S.C. section 360bbb-3(b)(1), unless the authorization is terminated or revoked.  Performed at [facility], [place] [address], [place], [place] [address]   Blood Culture (routine x 2)     Status: None (Preliminary result)   Collection Time: 11/12/20  4:00 PM   Specimen: Site Not Specified; Blood  Result Value Ref Range Status   Specimen Description   Final    SITE NOT SPECIFIED Performed at [facility] [address] [place], [place] [address]    Special Requests   Final    BOTTLES DRAWN AEROBIC AND ANAEROBIC Blood Culture adequate volume Performed at [facility] [address] [place], [place] [address]    Culture   Final    NO GROWTH 2 DAYS Performed at [facility] [address] [place], [place] [address]    Report Status PENDING  Incomplete  Blood Culture (routine x 2)     Status: None (Preliminary result)   Collection Time: 11/12/20  4:00 PM   Specimen: Site Not Specified; Blood  Result Value Ref Range Status   Specimen Description   Final    SITE NOT SPECIFIED Performed at [facility] [address] [place], [place] [address]    Special Requests   Final    BOTTLES DRAWN AEROBIC AND ANAEROBIC Blood Culture adequate volume Performed at [facility] [address], [place], [place] [address]    Culture   Final    NO GROWTH 2 DAYS Performed at [facility] [address] [place], [place] [address]    Report Status PENDING  Incomplete  Urine culture     Status: None   Collection Time: 11/12/20 11:53 PM   Specimen: In/Out Cath Urine  Result Value Ref Range Status   Specimen Description IN/OUT CATH URINE  Final   Special Requests NONE  Final   Culture   Final    NO GROWTH Performed at [facility], [place] [address], [place], [place] [address]    Report Status  11/14/2020 FINAL  Final  MRSA PCR Screening     Status: None   Collection Time: 11/14/20  7:38 AM   Specimen: Nasopharyngeal  Result Value Ref Range Status   MRSA by PCR NEGATIVE NEGATIVE Final    Comment:        The GeneXpert MRSA Assay (FDA approved for NASAL specimens only), is one component of a comprehensive MRSA colonization surveillance program. It is not intended to diagnose MRSA infection nor to guide or monitor treatment for MRSA infections. Performed at [facility], [place] [address] [place], [place] [address]         Radiology Studies: No results found.      Scheduled Meds: . vitamin C  1,000 mg Oral Daily  . atorvastatin  20 mg Oral Daily  .  chlorhexidine      . [START ON 11/15/2020] docusate sodium  100 mg Oral Daily  . gabapentin  300 mg Oral QHS  . [START ON 11/17/2020] heparin  5,000 Units Subcutaneous Q8H  . insulin aspart  0-15 Units Subcutaneous TID WC  . insulin aspart  0-5 Units Subcutaneous QHS  . insulin glargine  20 Units Subcutaneous QHS  . pantoprazole  40 mg Oral Daily  . polyethylene glycol  17 g Oral Daily  . zinc sulfate  220 mg Oral Daily   Continuous Infusions: . sodium chloride    . ceFEPime (MAXIPIME) IV 2 g (11/14/20 0814)  . metronidazole 500 mg (11/14/20 1233)  . vancomycin 1,500 mg (11/13/20 1605)     LOS: 2 days     Dana Allan MD [facility] 11/14/2020, 3:26 PM  If 7PM-7AM, please contact night-coverage [email or web address]

## 2020-11-14 NOTE — Evaluation (Signed)
Physical Therapy Evaluation Patient Details Name: Todd Mccoy MRN: [id number] DOB: [birth year] Today's Date: 11/14/2020   History of Present Illness  50 y.o. male presents to [facility] ED on 11/12/2020 with worsening L foot wound. Pt found to have osteomyelitis of L 5th ray. Pt underwent L 5th ray amputation on 11/14/2020. PMH includes DM, HTN.  Clinical Impression  Pt demonstrates ability to perform bed mobility without the need for physical assistance. Pt tolerates gait of household distances during session with use of crutches and RW. When using crutches, pt demonstrates significant instability and 1 LOB, requiring SPT assist to safely descend to chair. Pt performs gait with RW and demonstrates improved mobility and balance. Pt's vision is a barrier to his mobility, requiring assistance in navigating the hospital room during mobility. Pt reports that if he is unable to go outside, he will discharge AMA. Pt further explains that he is anxious in his hospital room. Pt demonstrates deficits in strength, endurance, power, balance, activity tolerance, and safety and will benefit from acute PT to improve independence in mobility. Pt will benefit from HHPT to aid in improving his balance and activity tolerance.     Follow Up Recommendations Home health PT    Equipment Recommendations  Rolling walker with 5" wheels    Recommendations for Other Services       Precautions / Restrictions Precautions Precautions: Fall;Other (comment) Precaution Comments: wound vac, visual deficits Restrictions Weight Bearing Restrictions: Yes LLE Weight Bearing: Non weight bearing      Mobility  Bed Mobility Overal bed mobility: Modified Independent             General bed mobility comments: HOB elevated    Transfers Overall transfer level: Needs assistance Equipment used: Crutches;Rolling walker (2 wheeled) Transfers: Sit to/from Stand Sit to Stand: Min assist         General transfer comment:  Pt requires cues for hand placement and L LE management to avoid weightbearing through L Le. Pt given multiple reminders to avoid weightbearing through L LE. Pt experiences 1 LOB during mobility with crutches with SPT assist to safely descend into chair.  Ambulation/Gait Ambulation/Gait assistance: Min assist Gait Distance (Feet): 60 Feet Assistive device: Crutches;Rolling walker (2 wheeled) Gait Pattern/deviations: Step-through pattern (R LE step through pattern, NWB L LE.) Gait velocity: decreased Gait velocity interpretation: <1.8 ft/sec, indicate of risk for recurrent falls General Gait Details: Pt presents with significant instability when attempting to ambulate with crutches. Pt demonstrates improved stability when using RW to mobilize. Pt's visual deficits require assistance with navigating around the hospital room when ambulating.  Stairs            Wheelchair Mobility    Modified Rankin (Stroke Patients Only)       Balance Overall balance assessment: Needs assistance Sitting-balance support: No upper extremity supported;Feet supported Sitting balance-Leahy Scale: Good     Standing balance support: Bilateral upper extremity supported;Single extremity supported;During functional activity Standing balance-Leahy Scale: Poor Standing balance comment: Pt reliant on at least single upper extremity support for balance and to maintain weightbearing precautions.                             Pertinent Vitals/Pain Pain Assessment: No/denies pain    Home Living Family/patient expects to be discharged to:: Private residence Living Arrangements: Parent;Other relatives (mom, neice) Available Help at Discharge: Available 24 hours/day Type of Home: House Home Access: Level entry  Home Layout: Multi-level;Able to live on main level with bedroom/bathroom Home Equipment: Crutches;Walker - 4 wheels;Cane - single point      Prior Function Level of Independence:  Independent with assistive device(s)         Comments: Pt reports mobilizing with crutches PTA.     Hand Dominance        Extremity/Trunk Assessment   Upper Extremity Assessment Upper Extremity Assessment: Defer to OT evaluation    Lower Extremity Assessment Lower Extremity Assessment: Generalized weakness    Cervical / Trunk Assessment Cervical / Trunk Assessment: Normal  Communication   Communication: No difficulties  Cognition Arousal/Alertness: Awake/alert Behavior During Therapy: Restless;Anxious;Impulsive Overall Cognitive Status: Within Functional Limits for tasks assessed                                 General Comments: Pt reports he cannot take being here at the [facility], stating the lighting bothers his vision. Repetitively states that he would like to be outside.      General Comments General comments (skin integrity, edema, etc.): Pt reports he is unable to tolerate lighting in his room and expresses his anxiety being in the hospital, says he will leave hospital AMA.    Exercises     Assessment/Plan    PT Assessment Patient needs continued PT services  PT Problem List Decreased strength;Decreased activity tolerance;Decreased balance;Decreased mobility;Decreased coordination;Decreased safety awareness;Decreased knowledge of precautions       PT Treatment Interventions DME instruction;Gait training;Functional mobility training;Therapeutic activities;Therapeutic exercise;Balance training;Patient/family education    PT Goals (Current goals can be found in the Care Plan section)  Acute Rehab PT Goals Patient Stated Goal: Return home. PT Goal Formulation: With patient Time For Goal Achievement: 11/28/20 Potential to Achieve Goals: Good    Frequency Min 5X/week   Barriers to discharge        Co-evaluation               AM-PAC PT "6 Clicks" Mobility  Outcome Measure Help needed turning from your back to your side while in a  flat bed without using bedrails?: None Help needed moving from lying on your back to sitting on the side of a flat bed without using bedrails?: None Help needed moving to and from a bed to a chair (including a wheelchair)?: A Little Help needed standing up from a chair using your arms (e.g., wheelchair or bedside chair)?: A Little Help needed to walk in hospital room?: A Little Help needed climbing 3-5 steps with a railing? : A Lot 6 Click Score: 19    End of Session Equipment Utilized During Treatment: Gait belt Activity Tolerance: Patient tolerated treatment well Patient left: in chair;with call bell/phone within reach;with chair alarm set Nurse Communication: Mobility status PT Visit Diagnosis: Unsteadiness on feet (R26.81);Other abnormalities of gait and mobility (R26.89);Muscle weakness (generalized) (M62.81);Difficulty in walking, not elsewhere classified (R26.2)    Time: [id number] PT Time Calculation (min) (ACUTE ONLY): 30 min   Charges:   PT Evaluation $PT Eval Moderate Complexity: 1 Mod PT Treatments $Gait Training: 8-22 mins        Acute Rehab  Pager: [phone]   Garwin Brothers, SPT  11/14/2020, 5:46 PM

## 2020-11-14 NOTE — Anesthesia Postprocedure Evaluation (Signed)
Anesthesia Post Note  Patient: Todd Mccoy  Procedure(s) Performed: LEFT 5TH RAY AMPUTATION (Left Toe)     Patient location during evaluation: PACU Anesthesia Type: Regional and MAC Level of consciousness: awake and alert Pain management: pain level controlled Vital Signs Assessment: post-procedure vital signs reviewed and stable Respiratory status: spontaneous breathing, nonlabored ventilation, respiratory function stable and patient connected to nasal cannula oxygen Cardiovascular status: stable and blood pressure returned to baseline Postop Assessment: no apparent nausea or vomiting Anesthetic complications: no   No complications documented.  Last Vitals:  Vitals:   11/14/20 1130 11/14/20 1145  BP: 123/87 138/87  Pulse: 93 90  Resp: 20 20  Temp: 36.9 C 36.4 C  SpO2: 99% 99%    Last Pain:  Vitals:   11/14/20 1201  TempSrc:   PainSc: 0-No pain                 Daimion Adamcik S

## 2020-11-14 NOTE — Progress Notes (Signed)
PT Cancellation Note  Patient Details Name: Todd Mccoy MRN: [id number] DOB: 02-05-1971   Cancelled Treatment:    Reason Eval/Treat Not Completed: Other (comment).  Pt is still numb in surgery leg, will retry at another time.   Ramond Dial 11/14/2020, 1:11 PM   Mee Hives, PT MS Acute Rehab Dept. Number: [place] [facility] [place]

## 2020-11-15 LAB — CBC
HCT: 23 % — ABNORMAL LOW (ref 39.0–52.0)
Hemoglobin: 7.2 g/dL — ABNORMAL LOW (ref 13.0–17.0)
MCH: 25.7 pg — ABNORMAL LOW (ref 26.0–34.0)
MCHC: 31.3 g/dL (ref 30.0–36.0)
MCV: 82.1 fL (ref 80.0–100.0)
Platelets: 384 10*3/uL (ref 150–400)
RBC: 2.8 MIL/uL — ABNORMAL LOW (ref 4.22–5.81)
RDW: 15.9 % — ABNORMAL HIGH (ref 11.5–15.5)
WBC: 13 10*3/uL — ABNORMAL HIGH (ref 4.0–10.5)
nRBC: 0 % (ref 0.0–0.2)

## 2020-11-15 LAB — GLUCOSE, CAPILLARY
Glucose-Capillary: 84 mg/dL (ref 70–99)
Glucose-Capillary: 111 mg/dL — ABNORMAL HIGH (ref 70–99)
Glucose-Capillary: 137 mg/dL — ABNORMAL HIGH (ref 70–99)

## 2020-11-15 MED ORDER — PANTOPRAZOLE SODIUM 40 MG PO TBEC: 40.0000 mg | DELAYED_RELEASE_TABLET | Freq: Every day | ORAL | 1 refills | Status: DC

## 2020-11-15 MED ORDER — ZINC SULFATE 220 (50 ZN) MG PO CAPS: 220.0000 mg | ORAL_CAPSULE | Freq: Every day | ORAL | 1 refills | Status: DC

## 2020-11-15 MED ORDER — DOCUSATE SODIUM 100 MG PO CAPS: 100.0000 mg | ORAL_CAPSULE | Freq: Every day | ORAL | 0 refills | Status: DC

## 2020-11-15 MED ORDER — ASCORBIC ACID 1000 MG PO TABS: 1000.0000 mg | ORAL_TABLET | Freq: Every day | ORAL | 1 refills | Status: DC

## 2020-11-15 MED ORDER — OXYCODONE HCL 5 MG PO TABS: 5.0000 mg | ORAL_TABLET | ORAL | 0 refills | Status: DC | PRN

## 2020-11-15 NOTE — Evaluation (Signed)
Occupational Therapy Evaluation Patient Details Name: Todd Mccoy MRN: [id number] DOB: Apr 26, 1971 Today's Date: 11/15/2020    History of Present Illness 50 y.o. male presents to [facility] ED on 11/12/2020 with worsening L foot wound. Pt found to have osteomyelitis of L 5th toe. Pt underwent L 5th toe amputation on 11/14/2020. PMH includes DM, HTN.   Clinical Impression   Pt admitted with L foot wound and s/p L 5th toe amputation . Pt prior level has decrease vision and reports they can operate in home with less cues for mobility as they know the layout of the home. Pt requires set-up for seated ADLS and cues on placement, min guard with RW and increase in time for ambulation and min assistance with standing ADLS due to WB status.  Pt currently with functional limitations due to the deficits listed below (see OT Problem List). Pt will benefit from skilled OT to increase their safety and independence with ADL and functional mobility for ADL to facilitate discharge to venue listed below.       Follow Up Recommendations  Home health OT;Supervision/Assistance - 24 hour    Equipment Recommendations  Tub/shower seat;3 in 1 bedside commode    Recommendations for Other Services       Precautions / Restrictions Precautions Precautions: Fall;Other (comment) Precaution Comments: wound vac, visual deficits Restrictions Weight Bearing Restrictions: Yes LLE Weight Bearing: Non weight bearing      Mobility Bed Mobility Overal bed mobility: Modified Independent             General bed mobility comments: increase in time due to cues needed    Transfers Overall transfer level: Needs assistance Equipment used: Rolling walker (2 wheeled) Transfers: Sit to/from Stand Sit to Stand: Min guard         General transfer comment: Pt requires cues for hand position    Balance Overall balance assessment: Needs assistance Sitting-balance support: No upper extremity supported;Feet  supported Sitting balance-Leahy Scale: Good     Standing balance support: Bilateral upper extremity supported;Single extremity supported;During functional activity Standing balance-Leahy Scale: Poor Standing balance comment: Pt reliant on at least single upper extremity support for balance and to maintain weightbearing precautions.                           ADL either performed or assessed with clinical judgement   ADL Overall ADL's : Needs assistance/impaired Eating/Feeding: Modified independent;Sitting Eating/Feeding Details (indicate cue type and reason): due to low vision needs assist to set up Grooming: Wash/dry hands;Wash/dry face;Sitting;Set up   Upper Body Bathing: Min guard;Sitting;Cueing for safety;Cueing for sequencing   Lower Body Bathing: Minimal assistance;Cueing for safety;Cueing for sequencing;Sit to/from stand   Upper Body Dressing : Set up;Sitting   Lower Body Dressing: Minimal assistance;Cueing for safety;Cueing for sequencing;Sit to/from stand   Toilet Transfer: Minimal assistance;Cueing for safety;Cueing for sequencing;Regular Toilet;RW;Grab bars   Toileting- Clothing Manipulation and Hygiene: Minimal assistance;Cueing for safety;Cueing for sequencing;Sit to/from stand   Tub/ Shower Transfer: Minimal assistance;Rolling walker;Grab bars;Shower seat   Functional mobility during ADLs: Min guard;Cueing for safety;Cueing for sequencing;Rolling walker General ADL Comments: due to vision needs increase in cues to complete     Vision Baseline Vision/History: Wears glasses (pt reports declining vision and can see shadows)       Perception     Praxis      Pertinent Vitals/Pain Pain Assessment: Faces Faces Pain Scale: Hurts a little bit Pain Location: LLE Pain Descriptors /  Indicators: Aching     Hand Dominance     Extremity/Trunk Assessment Upper Extremity Assessment Upper Extremity Assessment: Overall WFL for tasks assessed   Lower  Extremity Assessment Lower Extremity Assessment: Defer to PT evaluation   Cervical / Trunk Assessment Cervical / Trunk Assessment: Normal   Communication Communication Communication: No difficulties   Cognition Arousal/Alertness: Awake/alert   Overall Cognitive Status: Within Functional Limits for tasks assessed                                     General Comments       Exercises     Shoulder Instructions      Home Living Family/patient expects to be discharged to:: Private residence Living Arrangements: Parent;Other relatives Available Help at Discharge: Available 24 hours/day Type of Home: House Home Access: Level entry     Home Layout: Multi-level;Able to live on main level with bedroom/bathroom     Bathroom Shower/Tub: Occupational psychologist: Standard Bathroom Accessibility: Yes How Accessible: Accessible via walker Home Equipment: Crutches;Walker - 4 wheels;Cane - single point          Prior Functioning/Environment Level of Independence: Independent with assistive device(s)        Comments: Pt reports mobilizing with crutches PTA.        OT Problem List: Decreased strength;Decreased activity tolerance;Impaired balance (sitting and/or standing);Impaired vision/perception;Decreased safety awareness;Decreased knowledge of use of DME or AE;Pain      OT Treatment/Interventions: Self-care/ADL training;DME and/or AE instruction;Therapeutic activities;Visual/perceptual remediation/compensation;Patient/family education;Balance training    OT Goals(Current goals can be found in the care plan section) Acute Rehab OT Goals Patient Stated Goal: Return home. OT Goal Formulation: With patient Time For Goal Achievement: 11/29/20 Potential to Achieve Goals: Good ADL Goals Pt Will Perform Upper Body Bathing: with modified independence;sitting Pt Will Perform Lower Body Bathing: with set-up;sit to/from stand Pt Will Transfer to Toilet: with  modified independence;ambulating;regular height toilet;grab bars Pt Will Perform Tub/Shower Transfer: with set-up;shower seat;grab bars  OT Frequency: Min 2X/week   Barriers to D/C:            Co-evaluation              AM-PAC OT "6 Clicks" Daily Activity     Outcome Measure Help from another person eating meals?: None Help from another person taking care of personal grooming?: None Help from another person toileting, which includes using toliet, bedpan, or urinal?: A Little Help from another person bathing (including washing, rinsing, drying)?: A Little Help from another person to put on and taking off regular upper body clothing?: None Help from another person to put on and taking off regular lower body clothing?: A Little 6 Click Score: 21   End of Session Equipment Utilized During Treatment: Gait belt;Rolling walker Nurse Communication: Mobility status  Activity Tolerance: Patient tolerated treatment well Patient left: in bed;with call bell/phone within reach;with bed alarm set  OT Visit Diagnosis: Unsteadiness on feet (R26.81);Other abnormalities of gait and mobility (R26.89);Muscle weakness (generalized) (M62.81);Pain Pain - Right/Left: Left Pain - part of body: Leg                Time: [id number] OT Time Calculation (min): 23 min Charges:  OT General Charges $OT Visit: 1 Visit OT Evaluation $OT Eval Low Complexity: 1 Low OT Treatments $Self Care/Home Management : 8-22 mins  Joeseph Amor OTR/L  Acute Rehab Services  [phone]  office number [phone] pager number   Joeseph Amor 11/15/2020, 8:17 AM

## 2020-11-15 NOTE — TOC Transition Note (Signed)
Transition of Care [facility]) - CM/SW Discharge Note   Patient Details  Name: Todd Mccoy MRN: [id number] Date of Birth: 1971/03/27  Transition of Care [facility]) CM/SW Contact:  Bartholomew Crews, RN Phone Number: [phone] 11/15/2020, 3:59 PM   Clinical Narrative:     Spoke with patient at the bedside to discuss transition planning. PTA home with mom and his niece who is minor child. He can normally get around his home despite limited vision. Does not drive, but has a car that is driven by his cousin to take him to medical appointments. He is active with PCP at [facility] and uses [facility] pharmacy. His back up pharmacy is [facility].   Referral to [place] for charity [facility] PT/OT - pending qualification. Referral to [facility] for RW through charity program.   No further TOC needs identified.   Final next level of care: [place] Barriers to Discharge: No Barriers Identified   Patient Goals and CMS Choice Patient states their goals for this hospitalization and ongoing recovery are:: return home CMS Medicare.gov Compare Post Acute Care list provided to:: Patient Choice offered to / list presented to : Patient  Discharge Placement                       Discharge Plan and Services                DME Arranged: Walker rolling DME Agency: [facility] Date DME Agency Contacted: 11/15/20 Time DME Agency Contacted: [id number] Representative spoke with at DME Agency: [place]: PT,OT [place]: [place] ([place]) Date [place]: 11/15/20 Time [place]: [id number] Representative spoke with at [place]: [place] Interventions     Readmission Risk Interventions No flowsheet data found.

## 2020-11-15 NOTE — Discharge Summary (Signed)
Physician Discharge Summary  Patient ID: Todd Mccoy MRN: [id number] DOB/AGE: [birth year] 50 y.o.  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  Patient was admitted with worsening left foot wound that was first noticed 2 to 3 weeks prior to presentation.  On admission, patient was documented to have met sepsis criteria.  Patient was started on empiric antibiotics to cover diabetic foot infection on presentation.  Orthopedic surgery was consulted to assist patient's management.  Patient eventually underwent left fifth toe ray amputation.  Sepsis physiology has resolved.  Wound VAC was applied after surgery.  Antibiotics were continued for 24 hours after surgery as per orthopedic surgery's postoperative instruction.  Patient has been cleared for discharge by the orthopedic surgery team.  Physical therapy and Occupational Therapy will follow patient on discharge.  Sepsis -Documented findings on presentation include tachycardia, leukocytosis, left foot wound that was consistent with cellulitis and probable underlying myelitis.   - xray showed erosive changes  -Patient was started on vancomycin, cefepime and Flagyl on presentation.   -Blood cultures obtained in the ER -Orthopedic team was consulted to direct patient's care.   -Patient eventually underwent left fifth toe ray amputation on 11/14/2020 and antibiotics were continued for 24 hours after surgery. -Patient has been cleared for  discharge by the orthopedic team.  DMII -Blood sugar was optimized during the hospital stay. -Complicated by retinopathy and neuropathy. -Patient also has chronic kidney disease stage III. -Primary care provider to consider adding Farxiga on outpatient basis  HTN -Goal blood pressure should be less than 130/80 mmHg. -Follow-up with primary care provider on discharge. -PCP will continue to optimize blood pressure.  Chronic kidney disease stage IIIa: -Likely secondary to combined diabetes mellitus and hypertension. -Counseled off IVFs ago. -PCP to consider referral to nephrology team.   Consults: orthopedic surgery  Discharge Exam: Blood pressure (!) 158/88, pulse (!) 103, temperature 98.8 F (37.1 C), temperature source Oral, resp. rate 19, weight 89.8 kg, SpO2 100 %.  Disposition: Discharge disposition: 01-Home or Self Care   Discharge Instructions    Diet - low sodium heart healthy   Complete by: As directed    Discharge instructions   Complete by: As directed    Continue wound care. Follow up with PCP and Orthopedic team within 1 week of discharge   Discharge wound care:   Complete by: As directed    Continue current wound care.   Increase activity slowly   Complete by: As directed    Negative Pressure Wound Therapy - Incisional   Complete by: As directed    Show patient how to attach preveena vac     Allergies as of 11/15/2020      Reactions   Amlodipine Swelling   BLE edema      Medication List    STOP taking these medications   acetaminophen 325 MG tablet Commonly known as: Tylenol   doxycycline 100 MG tablet Commonly known as: VIBRA-TABS   HYDROcodone-acetaminophen 5-325 MG tablet Commonly known as: NORCO/VICODIN     TAKE these medications   ascorbic  acid 1000 MG tablet Commonly known as: VITAMIN C Take 1 tablet (1,000 mg total) by mouth daily. Start taking on: Nov 16, 2020   atorvastatin 20 MG tablet Commonly known as: LIPITOR Take 1  tablet (20 mg total) by mouth daily.   docusate sodium 100 MG capsule Commonly known as: COLACE Take 1 capsule (100 mg total) by mouth daily. Start taking on: Nov 16, 2020   gabapentin 300 MG capsule Commonly known as: NEURONTIN Take 1 capsule (300 mg total) by mouth at bedtime.   Lantus SoloStar 100 UNIT/ML Solostar Pen Generic drug: insulin glargine Inject 35 Units into the skin at bedtime. What changed: when to take this   losartan-hydrochlorothiazide 100-25 MG tablet Commonly known as: HYZAAR TAKE 1 TABLET BY MOUTH DAILY.   metFORMIN 500 MG 24 hr tablet Commonly known as: GLUCOPHAGE-XR TAKE 1 TABLET (500 MG TOTAL) BY MOUTH 2 (TWO) TIMES DAILY.   oxyCODONE 5 MG immediate release tablet Commonly known as: Oxy IR/ROXICODONE Take 1 tablet (5 mg total) by mouth every 4 (four) hours as needed for moderate pain, severe pain or breakthrough pain.   pantoprazole 40 MG tablet Commonly known as: PROTONIX Take 1 tablet (40 mg total) by mouth daily. Start taking on: Nov 16, 2020   Safety Insulin Syringes 30G X 1/2" 1 ML Misc Generic drug: Insulin Syringe-Needle U-100 35 Units by Does not apply route 2 (two) times daily before a meal.   TRUEplus Pen Needles 32G X 4 MM Misc Generic drug: Insulin Pen Needle USE TO INJECT INSULIN DAILY.   vitamin B-12 500 MCG tablet Commonly known as: CYANOCOBALAMIN Take 500 mcg by mouth daily.   zinc sulfate 220 (50 Zn) MG capsule Take 1 capsule (220 mg total) by mouth daily. Start taking on: Nov 16, 2020            Durable Medical Equipment  (From admission, onward)         Start     Ordered   11/15/20 1504  DME Walker  Once       Question Answer Comment  Walker: With 5 Inch Wheels   Patient needs a walker to treat with the following condition Foot infection      11/15/20 1507           Discharge Care Instructions  (From admission, onward)         Start     Ordered   11/15/20 0000  Discharge wound care:        Comments: Continue current wound care.   11/15/20 1507          Follow-up Information    Persons, Todd Palmer, PA In 1 week.   Specialty: Orthopedic Surgery Contact information: [address] [place] [address] [phone]               Signed: Bonnell Mccoy 11/15/2020, 3:07 PM

## 2020-11-15 NOTE — Plan of Care (Signed)
  Problem: Education: Goal: Knowledge of General Education information will improve Description: Including pain rating scale, medication(s)/side effects and non-pharmacologic comfort measures Outcome: Adequate for Discharge   Problem: Health Behavior/Discharge Planning: Goal: Ability to manage health-related needs will improve Outcome: Adequate for Discharge   Problem: Clinical Measurements: Goal: Ability to maintain clinical measurements within normal limits will improve Outcome: Adequate for Discharge Goal: Will remain free from infection Outcome: Adequate for Discharge Goal: Diagnostic test results will improve Outcome: Adequate for Discharge Goal: Respiratory complications will improve Outcome: Adequate for Discharge Goal: Cardiovascular complication will be avoided Outcome: Adequate for Discharge   Problem: Activity: Goal: Risk for activity intolerance will decrease Outcome: Adequate for Discharge   Problem: Nutrition: Goal: Adequate nutrition will be maintained Outcome: Adequate for Discharge   Problem: Coping: Goal: Level of anxiety will decrease Outcome: Adequate for Discharge   Problem: Elimination: Goal: Will not experience complications related to bowel motility Outcome: Adequate for Discharge Goal: Will not experience complications related to urinary retention Outcome: Adequate for Discharge   Problem: Pain Managment: Goal: General experience of comfort will improve Outcome: Adequate for Discharge   Problem: Safety: Goal: Ability to remain free from injury will improve Outcome: Adequate for Discharge   Problem: Skin Integrity: Goal: Risk for impaired skin integrity will decrease Outcome: Adequate for Discharge   Problem: Fluid Volume: Goal: Hemodynamic stability will improve Outcome: Adequate for Discharge   Problem: Clinical Measurements: Goal: Diagnostic test results will improve Outcome: Adequate for Discharge Goal: Signs and symptoms of  infection will decrease Outcome: Adequate for Discharge   Problem: Respiratory: Goal: Ability to maintain adequate ventilation will improve Outcome: Adequate for Discharge   Problem: Acute Rehab PT Goals(only PT should resolve) Goal: Patient Will Transfer Sit To/From Stand Outcome: Adequate for Discharge Goal: Pt Will Transfer Bed To Chair/Chair To Bed Outcome: Adequate for Discharge Goal: Pt Will Ambulate Outcome: Adequate for Discharge   Problem: Acute Rehab OT Goals (only OT should resolve) Goal: Pt. Will Perform Upper Body Bathing Outcome: Adequate for Discharge Goal: Pt. Will Perform Lower Body Bathing Outcome: Adequate for Discharge Goal: Pt. Will Transfer To Toilet Outcome: Adequate for Discharge Goal: Pt. Will Perform Tub/Shower Transfer Outcome: Adequate for Discharge

## 2020-11-15 NOTE — Progress Notes (Signed)
Patient ID: Todd Mccoy, male   DOB: September 09, 1970, 50 y.o.   MRN: [id number] Patient is postoperative day 1 left foot fifth ray amputation.  Patient has no complaints this morning he is sitting up eating breakfast.  There is no drainage in the wound VAC canister.  The wound VAC is not plugged in and I have plugged in the device.  Patient may discharge to home from an orthopedic standpoint once he is safe with therapy.  Minimize weightbearing on the left lower extremity.

## 2020-11-15 NOTE — Progress Notes (Signed)
Inpatient Rehab Admissions Coordinator:  Consult received.  Therapies are recommending [place] therapy.  Will sign off.   Gayland Curry, [place], [place] Admissions Coordinator [phone]

## 2020-11-15 NOTE — Progress Notes (Signed)
Physical Therapy Treatment Patient Details Name: Todd Mccoy MRN: [id number] DOB: May 07, 1971 Today's Date: 11/15/2020    History of Present Illness 50 y.o. male presents to [facility] ED on 11/12/2020 with worsening L foot wound. Pt found to have osteomyelitis of L 5th ray. Pt underwent L 5th ray amputation on 11/14/2020. PMH includes DM, HTN.    PT Comments    Pt demonstrates ability to perform bed mobility and gait without requiring physical assistance today. Pt experiences 1 LOB during initial sit>stand transfer, requiring physical assistance to correct balance. Pt given cues for strategies to improve safety when performing sit<>stand transfers, with improved performance. Pt tolerates ambulation with RW with improved stability compared to last session, without the need for physical assistance. Pt continues to demonstrate deficits in strength, endurance, power, and activity tolerance and will benefit from continued acute PT to improve safety and independence in mobility.   Follow Up Recommendations  Home health PT     Equipment Recommendations  Rolling walker with 5" wheels    Recommendations for Other Services       Precautions / Restrictions Precautions Precautions: Fall;Other (comment) Precaution Comments: wound vac, visual deficits Restrictions Weight Bearing Restrictions: Yes LLE Weight Bearing: Non weight bearing    Mobility  Bed Mobility Overal bed mobility: Modified Independent             General bed mobility comments: HOB elevated    Transfers Overall transfer level: Needs assistance Equipment used: Rolling walker (2 wheeled) Transfers: Sit to/from Stand Sit to Stand: Min guard;Min assist (x 4 repetitions)         General transfer comment: Pt demonstrates good carry over for hand placement to perform sit>stand transfer. Pt experiences 1 LOB during first sit>stand transfer, requiring min A to correct balance. Pt requires VC to reach back with UEs to safely  perform stand>sit transfers.  Ambulation/Gait Ambulation/Gait assistance: Min guard Gait Distance (Feet): 60 Feet Assistive device: Rolling walker (2 wheeled) Gait Pattern/deviations:  (R LE hop to.) Gait velocity: decreased Gait velocity interpretation: <1.8 ft/sec, indicate of risk for recurrent falls General Gait Details: Pt with improved stability during gait activies.   Stairs             Wheelchair Mobility    Modified Rankin (Stroke Patients Only)       Balance Overall balance assessment: Needs assistance Sitting-balance support: No upper extremity supported;Feet supported Sitting balance-Leahy Scale: Good     Standing balance support: Bilateral upper extremity supported;Single extremity supported;During functional activity Standing balance-Leahy Scale: Poor Standing balance comment: Pt requires at least single UE support for stability and to maintain weightbearing precautions.                            Cognition Arousal/Alertness: Awake/alert Behavior During Therapy: WFL for tasks assessed/performed Overall Cognitive Status: Within Functional Limits for tasks assessed                                 General Comments: Pt gets emotional discussing visual deficits and recent death of son.      Exercises      General Comments General comments (skin integrity, edema, etc.): Pt reports feeling better today. Pt becomes emotional when discussing visual deficits and family life. Pt is determined to improve mobility.      Pertinent Vitals/Pain Pain Assessment: No/denies pain    Home Living  Prior Function            PT Goals (current goals can now be found in the care plan section) Acute Rehab PT Goals Patient Stated Goal: Return home. Progress towards PT goals: Progressing toward goals    Frequency    Min 5X/week      PT Plan Current plan remains appropriate    Co-evaluation               AM-PAC PT "6 Clicks" Mobility   Outcome Measure  Help needed turning from your back to your side while in a flat bed without using bedrails?: None Help needed moving from lying on your back to sitting on the side of a flat bed without using bedrails?: None Help needed moving to and from a bed to a chair (including a wheelchair)?: A Little Help needed standing up from a chair using your arms (e.g., wheelchair or bedside chair)?: A Little Help needed to walk in hospital room?: A Little Help needed climbing 3-5 steps with a railing? : A Lot 6 Click Score: 19    End of Session Equipment Utilized During Treatment: Gait belt Activity Tolerance: Patient tolerated treatment well Patient left: in bed;with call bell/phone within reach;with bed alarm set Nurse Communication: Mobility status PT Visit Diagnosis: Unsteadiness on feet (R26.81);Other abnormalities of gait and mobility (R26.89);Muscle weakness (generalized) (M62.81);Difficulty in walking, not elsewhere classified (R26.2)     Time: [id number] PT Time Calculation (min) (ACUTE ONLY): 32 min  Charges:  $Gait Training: 8-22 mins $Therapeutic Activity: 8-22 mins                     Acute Rehab  Pager: [phone]    Garwin Brothers, SPT 11/15/2020, 4:39 PM

## 2020-11-15 NOTE — Progress Notes (Signed)
Pt was given his AVS discharge summary and went over with him. IV was removed with catheter intact. Pt states someone will be at home to help him when he gets there. Rolling walker was delivered for pt to take home with him. Pt had no further questions.

## 2020-11-17 ENCOUNTER — Encounter (HOSPITAL_COMMUNITY): Payer: Self-pay | Admitting: Orthopedic Surgery

## 2020-11-17 LAB — CULTURE, BLOOD (ROUTINE X 2)
Culture: NO GROWTH
Culture: NO GROWTH
Special Requests: ADEQUATE
Special Requests: ADEQUATE

## 2020-11-20 ENCOUNTER — Encounter: Payer: Self-pay | Admitting: Physician Assistant

## 2020-11-20 ENCOUNTER — Ambulatory Visit (INDEPENDENT_AMBULATORY_CARE_PROVIDER_SITE_OTHER): Payer: Self-pay | Admitting: Physician Assistant

## 2020-11-20 ENCOUNTER — Other Ambulatory Visit: Payer: Self-pay

## 2020-11-20 DIAGNOSIS — M869 Osteomyelitis, unspecified: Secondary | ICD-10-CM

## 2020-11-20 MED ORDER — OXYCODONE HCL 5 MG PO TABS: 5.0000 mg | ORAL_TABLET | ORAL | 0 refills | Status: DC | PRN

## 2020-11-20 NOTE — Progress Notes (Signed)
Office Visit Note   Patient: Todd Mccoy           Date of Birth: 22-Jun-1971           MRN: [id number] Visit Date: 11/20/2020              Requested by: No referring provider defined for this encounter. PCP: Patient, No Pcp Per (Inactive)  Chief Complaint  Patient presents with  . Left Foot - Routine Post Op    11/20/20 left foot 5th ray amputation       HPI: Patient is 1 week status post fifth ray amputation.  He has had a wound VAC on.  He is requesting a refill of his pain medication.  He has been taking some vitamins but not any additional protein  Assessment & Plan: Visit Diagnoses: No diagnosis found.  Plan: May begin daily dressing changes and cleansing with antibacterial soap and water continue nonweightbearing and elevation talked him about protein zinc and vitamin C  Follow-Up Instructions: No follow-ups on file.   Ortho Exam  Patient is alert, oriented, no adenopathy, well-dressed, normal affect, normal respiratory effort. Surgical sutures are in place in the central portion of the wound he does have some gaping and dehiscence.  This does not probe deeply.  No necrosis no foul odor he does have some bloody drainage no ascending cellulitis  Imaging: No results found. No images are attached to the encounter.  Labs: Lab Results  Component Value Date   HGBA1C 8.6 (H) 11/12/2020   HGBA1C 8.3 (H) 11/12/2020   HGBA1C 8.0 (A) 10/22/2020   ESRSEDRATE 126 (H) 03/24/2020   REPTSTATUS 11/14/2020 FINAL 11/12/2020   GRAMSTAIN  11/07/2019    NO WBC SEEN ABUNDANT GRAM POSITIVE COCCI FEW GRAM POSITIVE RODS    CULT  11/12/2020    NO GROWTH Performed at [facility], [place] [address] [place], [place] [address]    LABORGA STAPHYLOCOCCUS AUREUS 11/07/2019     Lab Results  Component Value Date   ALBUMIN 2.3 (L) 11/12/2020   ALBUMIN 2.4 (L) 04/17/2020   ALBUMIN 2.3 (L) 03/19/2020    Lab Results  Component Value Date   MG 1.6 (L) 11/13/2020   MG  1.5 09/07/2014   No results found for: VD25OH  No results found for: PREALBUMIN CBC EXTENDED Latest Ref Rng & Units 11/15/2020 11/14/2020 11/13/2020  WBC 4.0 - 10.5 K/uL 13.0(H) 12.9(H) 15.3(H)  RBC 4.22 - 5.81 MIL/uL 2.80(L) 2.99(L) 2.92(L)  HGB 13.0 - 17.0 g/dL 7.2(L) 7.7(L) 7.6(L)  HCT 39.0 - 52.0 % 23.0(L) 24.8(L) 24.5(L)  PLT 150 - 400 K/uL 384 429(H) 429(H)  NEUTROABS 1.7 - 7.7 K/uL - - 12.5(H)  LYMPHSABS 0.7 - 4.0 K/uL - - 1.6     There is no height or weight on file to calculate BMI.  Orders:  No orders of the defined types were placed in this encounter.  No orders of the defined types were placed in this encounter.    Procedures: No procedures performed  Clinical Data: No additional findings.  ROS:  All other systems negative, except as noted in the HPI. Review of Systems  Objective: Vital Signs: There were no vitals taken for this visit.  Specialty Comments:  No specialty comments available.  PMFS History: Patient Active Problem List   Diagnosis Date Noted  . Osteomyelitis of fifth toe of left foot ([place])   . Severe protein-calorie malnutrition ([place])   . HTN (hypertension) 11/12/2020  . Osteomyelitis of  second toe of right foot ([place])   . 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 Cellulitis and abscess of foot   . Toe osteomyelitis ([place]) 03/24/2020  . Necrotizing fasciitis ([place]) 09/08/2014  . Diabetes mellitus with hyperglycemia ([place]) 09/07/2014  . Tobacco abuse 09/07/2014  . Abscess of back   . Abscess of lower back 09/06/2014  . DM2 (diabetes mellitus, type 2) ([place]) 09/06/2014  . Sepsis ([place]) 09/06/2014  . Scrotal abscess 06/20/2014   Past Medical History:  Diagnosis Date  . Diabetes mellitus without complication ([place])   . Diabetic neuropathy ([place])   . 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[place]) 03/25/2020   pt states retinal eye damage- left worse than the right- recent visited MD   . 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[place]) 03/25/2020    pt states recent MD visit /left eye worse than right  . Hypertension     Family History  Problem Relation Age of Onset  . Diabetes Mother   . Diabetes Father   . Diabetes Brother   . Colon cancer Neg Hx   . Esophageal cancer Neg Hx   . Pancreatic cancer Neg Hx   . Stomach cancer Neg Hx   . Liver disease Neg Hx     Past Surgical History:  Procedure Laterality Date  . ABSCESS DRAINAGE     neck  . AMPUTATION Left 11/14/2020   Procedure: LEFT 5TH RAY AMPUTATION;  Surgeon: Newt Minion, MD;  Location: [facility];  Service: Orthopedics;  Laterality: Left;  . AMPUTATION TOE Right 03/25/2020   Procedure: AMPUTATION TOE;  Surgeon: Evelina Bucy, DPM;  [facility];  Service: Podiatry;  Laterality: Right;  . INCISION AND DRAINAGE ABSCESS Right 09/07/2014   Procedure: INCISION AND DRAINAGE ABSCESS RIGHT FLANK;  Surgeon: Jackolyn Confer, MD;  Location[facility];  Service: General;  Laterality: Right;  . SCROTUM EXPLORATION     Social History   Occupational History  . Not on file  Tobacco Use  . Smoking status: Current Every Day Smoker    Packs/day: 0.50    Types: Cigarettes  . Smokeless tobacco: Never Used  Substance and Sexual Activity  . Alcohol use: No  . Drug use: No  . Sexual activity: Not on file

## 2020-11-24 ENCOUNTER — Telehealth: Payer: Self-pay

## 2020-11-24 NOTE — Telephone Encounter (Signed)
Todd Mccoy called for verbal orders for pt for Physical Therapy for 1 time a week for 4 weeks

## 2020-11-25 NOTE — Telephone Encounter (Signed)
I left voicemail for [place] advising.

## 2020-11-25 NOTE — Telephone Encounter (Signed)
Hasty for verbal orders per Bevely Palmer.

## 2020-11-26 ENCOUNTER — Other Ambulatory Visit: Payer: Self-pay

## 2020-11-28 ENCOUNTER — Encounter: Payer: Self-pay | Admitting: Physician Assistant

## 2020-11-28 ENCOUNTER — Ambulatory Visit (INDEPENDENT_AMBULATORY_CARE_PROVIDER_SITE_OTHER): Payer: Self-pay | Admitting: Physician Assistant

## 2020-11-28 ENCOUNTER — Other Ambulatory Visit: Payer: Self-pay

## 2020-11-28 DIAGNOSIS — M869 Osteomyelitis, unspecified: Secondary | ICD-10-CM

## 2020-11-28 MED ORDER — PENTOXIFYLLINE ER 400 MG PO TBCR: 400.0000 mg | EXTENDED_RELEASE_TABLET | Freq: Three times a day (TID) | ORAL | 3 refills | Status: DC

## 2020-11-28 MED ORDER — NITROGLYCERIN 0.2 MG/HR TD PT24: 0.2000 mg | MEDICATED_PATCH | Freq: Every day | TRANSDERMAL | 12 refills | Status: DC

## 2020-11-28 NOTE — Progress Notes (Signed)
Office Visit Note   Patient: Todd Mccoy           Date of Birth: 01/09/1971           MRN: [id number] Visit Date: 11/28/2020              Requested by: No referring provider defined for this encounter. PCP: [name], MD  Chief Complaint  Patient presents with  . Left Foot - Routine Post Op    11/14/2020 left foot 5th ray amputation       HPI: Patient is a pleasant 50 year old gentleman who is 2 weeks status post left fifth ray amputation.  He has been doing daily dressing changes.  He has a new area of skin tearing on the medial side of his foot which she thinks is from placing tape there.  Assessment & Plan: Visit Diagnoses: No diagnosis found.  Plan: Do have concerns with regards to healing of the wound.  Do not see any infective process.  Will place him on a nitroglycerin patch and Trental showed him how to apply the patch we will follow-up in 1 week.  Continue daily cleansing  Follow-Up Instructions: No follow-ups on file.   Ortho Exam  Patient is alert, oriented, no adenopathy, well-dressed, normal affect, normal respiratory effort. Examination of his foot he does have gaping of the wound.  Sutures are in place.  Nothing probes deeply but little evidence of healing.  He has a large area of necrosis on the dorsal side of the wound.  No foul odor no ascending cellulitis  Imaging: No results found. No images are attached to the encounter.  Labs: Lab Results  Component Value Date   HGBA1C 8.6 (H) 11/12/2020   HGBA1C 8.3 (H) 11/12/2020   HGBA1C 8.0 (A) 10/22/2020   ESRSEDRATE 126 (H) 03/24/2020   REPTSTATUS 11/14/2020 FINAL 11/12/2020   GRAMSTAIN  11/07/2019    NO WBC SEEN ABUNDANT GRAM POSITIVE COCCI FEW GRAM POSITIVE RODS    CULT  11/12/2020    NO GROWTH Performed at [facility], [place] [address] [place], [place] [address]    LABORGA STAPHYLOCOCCUS AUREUS 11/07/2019     Lab Results  Component Value Date   ALBUMIN 2.3 (L) 11/12/2020    ALBUMIN 2.4 (L) 04/17/2020   ALBUMIN 2.3 (L) 03/19/2020    Lab Results  Component Value Date   MG 1.6 (L) 11/13/2020   MG 1.5 09/07/2014   No results found for: VD25OH  No results found for: PREALBUMIN CBC EXTENDED Latest Ref Rng & Units 11/15/2020 11/14/2020 11/13/2020  WBC 4.0 - 10.5 K/uL 13.0(H) 12.9(H) 15.3(H)  RBC 4.22 - 5.81 MIL/uL 2.80(L) 2.99(L) 2.92(L)  HGB 13.0 - 17.0 g/dL 7.2(L) 7.7(L) 7.6(L)  HCT 39.0 - 52.0 % 23.0(L) 24.8(L) 24.5(L)  PLT 150 - 400 K/uL 384 429(H) 429(H)  NEUTROABS 1.7 - 7.7 K/uL - - 12.5(H)  LYMPHSABS 0.7 - 4.0 K/uL - - 1.6     There is no height or weight on file to calculate BMI.  Orders:  No orders of the defined types were placed in this encounter.  Meds ordered this encounter  Medications  . pentoxifylline (TRENTAL) 400 MG CR tablet    Sig: Take 1 tablet (400 mg total) by mouth 3 (three) times daily with meals.    Dispense:  90 tablet    Refill:  3  . nitroGLYCERIN (NITRODUR - DOSED IN MG/24 HR) 0.2 mg/hr patch    Sig: Place 1 patch (0.2  mg total) onto the skin daily.    Dispense:  30 patch    Refill:  12     Procedures: No procedures performed  Clinical Data: No additional findings.  ROS:  All other systems negative, except as noted in the HPI. Review of Systems  Objective: Vital Signs: There were no vitals taken for this visit.  Specialty Comments:  No specialty comments available.  PMFS History: Patient Active Problem List   Diagnosis Date Noted  . Osteomyelitis of fifth toe of left foot ([place])   . Severe protein-calorie malnutrition ([place])   . HTN (hypertension) 11/12/2020  . Osteomyelitis of second toe of right foot ([place])   . 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 Cellulitis and abscess of foot   . Toe osteomyelitis ([place]) 03/24/2020  . Necrotizing fasciitis ([place]) 09/08/2014  . Diabetes mellitus with hyperglycemia ([place]) 09/07/2014  . Tobacco abuse 09/07/2014  . Abscess of back   . Abscess of lower back  09/06/2014  . DM2 (diabetes mellitus, type 2) ([place]) 09/06/2014  . Sepsis ([place]) 09/06/2014  . Scrotal abscess 06/20/2014   Past Medical History:  Diagnosis Date  . Diabetes mellitus without complication ([place])   . Diabetic neuropathy ([place])   . 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[place]) 03/25/2020   pt states retinal eye damage- left worse than the right- recent visited MD   . 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[place]) 03/25/2020   pt states recent MD visit /left eye worse than right  . Hypertension     Family History  Problem Relation Age of Onset  . Diabetes Mother   . Diabetes Father   . Diabetes Brother   . Colon cancer Neg Hx   . Esophageal cancer Neg Hx   . Pancreatic cancer Neg Hx   . Stomach cancer Neg Hx   . Liver disease Neg Hx     Past Surgical History:  Procedure Laterality Date  . ABSCESS DRAINAGE     neck  . AMPUTATION Left 11/14/2020   Procedure: LEFT 5TH RAY AMPUTATION;  Surgeon: Newt Minion, MD;  Location: [facility];  Service: Orthopedics;  Laterality: Left;  . AMPUTATION TOE Right 03/25/2020   Procedure: AMPUTATION TOE;  Surgeon: Evelina Bucy, DPM;  [facility];  Service: Podiatry;  Laterality: Right;  . INCISION AND DRAINAGE ABSCESS Right 09/07/2014   Procedure: INCISION AND DRAINAGE ABSCESS RIGHT FLANK;  Surgeon: Jackolyn Confer, MD;  [facility];  Service: General;  Laterality: Right;  . SCROTUM EXPLORATION     Social History   Occupational History  . Not on file  Tobacco Use  . Smoking status: Current Every Day Smoker    Packs/day: 0.50    Types: Cigarettes  . Smokeless tobacco: Never Used  Substance and Sexual Activity  . Alcohol use: No  . Drug use: No  . Sexual activity: Not on file

## 2020-12-04 ENCOUNTER — Encounter: Payer: Self-pay | Admitting: Internal Medicine

## 2020-12-04 ENCOUNTER — Telehealth: Payer: Self-pay

## 2020-12-04 NOTE — Telephone Encounter (Signed)
[facility] called she stated the patient missed his pt appointment Renee stated the patient did not leave a reason why he cancelled,she  stated she reached out to the patient and lvm for him to call back and  r/s she hasn't heard anything back from the patient call [phone]

## 2020-12-05 ENCOUNTER — Other Ambulatory Visit: Payer: Self-pay

## 2020-12-05 ENCOUNTER — Encounter: Payer: Self-pay | Admitting: Physician Assistant

## 2020-12-05 ENCOUNTER — Ambulatory Visit (INDEPENDENT_AMBULATORY_CARE_PROVIDER_SITE_OTHER): Payer: Self-pay

## 2020-12-05 ENCOUNTER — Ambulatory Visit (INDEPENDENT_AMBULATORY_CARE_PROVIDER_SITE_OTHER): Payer: Self-pay | Admitting: Physician Assistant

## 2020-12-05 DIAGNOSIS — M869 Osteomyelitis, unspecified: Secondary | ICD-10-CM

## 2020-12-05 MED ORDER — DOXYCYCLINE HYCLATE 100 MG PO TABS: 100.0000 mg | ORAL_TABLET | Freq: Two times a day (BID) | ORAL | 0 refills | Status: DC

## 2020-12-05 MED ORDER — OXYCODONE HCL 5 MG PO TABS: 5.0000 mg | ORAL_TABLET | ORAL | 0 refills | Status: DC | PRN

## 2020-12-05 NOTE — Progress Notes (Addendum)
Office Visit Note   Patient: Todd Mccoy           Date of Birth: [birth year]           MRN: [id number] Visit Date: 12/05/2020              Requested by: [name], MD [place],  [place] [id number] PCP: [name], MD  No chief complaint on file.     HPI: Patient is a pleasant 50 year old gentleman who is 3  weeks status post left fifth ray amputation.  He has had difficulties with progressive necrosis and healing despite having a palpable pulse.  He was placed on nitroglycerin patches and trental..  He is here in follow-up today.  He is also status post left depressed medial tibial plateau fracture diagnosed  5 months ago that has been treated conservatively by Raliegh Ip.Marland KitchenHe is wearing a hinged knee brace. He states he gets intermittent swelling in the leg since this injury.  denies fever, chills or calf pain  Assessment & Plan: Visit Diagnoses:  1. Osteomyelitis of fifth toe of left foot ([place])     Plan: Discussed with the patient that I do not think he has limb salvage options given the progressive nature of the necrosis circumferentially around his foot.  I will post him for surgery on Wednesday.  More than likely this will be a below-knee amputation.  He understands.  I gave him the option of meeting with Dr. Sharol Given on the day of surgery but he has requested if he could speak with him prior to that.  We will make a follow-up appoint meant with him on Monday.  In the meantime he was able continue dressing changes and I will order doxycycline.Should  Follow up in Emergency Room sooner if her has fever, chills increased pain in foot or leg.  Follow-Up Instructions: No follow-ups on file.   Ortho Exam  Patient is alert, oriented, no adenopathy, well-dressed, normal affect, normal respiratory effort. Examination of his left lower extremity lower extremity has moderate soft tissue swelling but no ascending cellulitis. Calf is nontender to paplation.  No calf pain on passive stretch.  Foot has palpable dorsalis pedis pulse.  He has progressive necrosis around the dehisced wound and now over the medial side of the foot.  He has delamination of the skin on the plantar surface of the foot.  With necrotic changes.  He has exposed bone at the remaining fifth metatarsal.  No ascending cellulitis at this time.  No fever or chills.  Imaging: XR Foot 2 Views Left  Result Date: 12/05/2020 2 views of his left foot demonstrate postsurgical changes consistent with fifth ray amputation.  He also has some changes within the fourth metatarsal and concerns for further osteomyelitis  No images are attached to the encounter.  Labs: Lab Results  Component Value Date   HGBA1C 8.6 (H) 11/12/2020   HGBA1C 8.3 (H) 11/12/2020   HGBA1C 8.0 (A) 10/22/2020   ESRSEDRATE 126 (H) 03/24/2020   REPTSTATUS 11/14/2020 FINAL 11/12/2020   GRAMSTAIN  11/07/2019    NO WBC SEEN ABUNDANT GRAM POSITIVE COCCI FEW GRAM POSITIVE RODS    CULT  11/12/2020    NO GROWTH Performed at [facility], [place] [address] [place], [place] [address]    LABORGA STAPHYLOCOCCUS AUREUS 11/07/2019     Lab Results  Component Value Date   ALBUMIN 2.3 (L) 11/12/2020   ALBUMIN 2.4 (L) 04/17/2020   ALBUMIN 2.3 (  L) 03/19/2020    Lab Results  Component Value Date   MG 1.6 (L) 11/13/2020   MG 1.5 09/07/2014   No results found for: VD25OH  No results found for: PREALBUMIN CBC EXTENDED Latest Ref Rng & Units 11/15/2020 11/14/2020 11/13/2020  WBC 4.0 - 10.5 K/uL 13.0(H) 12.9(H) 15.3(H)  RBC 4.22 - 5.81 MIL/uL 2.80(L) 2.99(L) 2.92(L)  HGB 13.0 - 17.0 g/dL 7.2(L) 7.7(L) 7.6(L)  HCT 39.0 - 52.0 % 23.0(L) 24.8(L) 24.5(L)  PLT 150 - 400 K/uL 384 429(H) 429(H)  NEUTROABS 1.7 - 7.7 K/uL - - 12.5(H)  LYMPHSABS 0.7 - 4.0 K/uL - - 1.6     There is no height or weight on file to calculate BMI.  Orders:  Orders Placed This Encounter  Procedures  . XR Foot 2 Views Left   Meds ordered  this encounter  Medications  . doxycycline (VIBRA-TABS) 100 MG tablet    Sig: Take 1 tablet (100 mg total) by mouth 2 (two) times daily.    Dispense:  20 tablet    Refill:  0     Procedures: No procedures performed  Clinical Data: No additional findings.  ROS:  All other systems negative, except as noted in the HPI. Review of Systems  Objective: Vital Signs: There were no vitals taken for this visit.  Specialty Comments:  No specialty comments available.  PMFS History: Patient Active Problem List   Diagnosis Date Noted  . Osteomyelitis of fifth toe of left foot ([place])   . Severe protein-calorie malnutrition ([place])   . HTN (hypertension) 11/12/2020  . Osteomyelitis of second toe of right foot ([place])   . 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 Cellulitis and abscess of foot   . Toe osteomyelitis ([place]) 03/24/2020  . Necrotizing fasciitis ([place]) 09/08/2014  . Diabetes mellitus with hyperglycemia ([place]) 09/07/2014  . Tobacco abuse 09/07/2014  . Abscess of back   . Abscess of lower back 09/06/2014  . DM2 (diabetes mellitus, type 2) ([place]) 09/06/2014  . Sepsis ([place]) 09/06/2014  . Scrotal abscess 06/20/2014   Past Medical History:  Diagnosis Date  . Diabetes mellitus without complication ([place])   . Diabetic neuropathy ([place])   . 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[place]) 03/25/2020   pt states retinal eye damage- left worse than the right- recent visited MD   . 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[place]) 03/25/2020   pt states recent MD visit /left eye worse than right  . Hypertension     Family History  Problem Relation Age of Onset  . Diabetes Mother   . Diabetes Father   . Diabetes Brother   . Colon cancer Neg Hx   . Esophageal cancer Neg Hx   . Pancreatic cancer Neg Hx   . Stomach cancer Neg Hx   . Liver disease Neg Hx     Past Surgical History:  Procedure Laterality Date  . ABSCESS DRAINAGE     neck  . AMPUTATION Left 11/14/2020   Procedure: LEFT 5TH RAY  AMPUTATION;  Surgeon: Newt Minion, MD;  Location: [facility];  Service: Orthopedics;  Laterality: Left;  . AMPUTATION TOE Right 03/25/2020   Procedure: AMPUTATION TOE;  Surgeon: Evelina Bucy, DPM;  [facility];  Service: Podiatry;  Laterality: Right;  . INCISION AND DRAINAGE ABSCESS Right 09/07/2014   Procedure: INCISION AND DRAINAGE ABSCESS RIGHT FLANK;  Surgeon: Jackolyn Confer, MD;  [facility];  Service: General;  Laterality: Right;  . SCROTUM  EXPLORATION     Social History   Occupational History  . Not on file  Tobacco Use  . Smoking status: Current Every Day Smoker    Packs/day: 0.50    Types: Cigarettes  . Smokeless tobacco: Never Used  Substance and Sexual Activity  . Alcohol use: No  . Drug use: No  . Sexual activity: Not on file

## 2020-12-05 NOTE — Addendum Note (Signed)
Addended by: Georgette Dover on: 12/05/2020 03:56 PM   Modules accepted: Orders

## 2020-12-05 NOTE — Telephone Encounter (Signed)
Pt has an appt today will eval and cb if services need to continue.

## 2020-12-05 NOTE — Telephone Encounter (Signed)
Pt sch for appt on Monday with Dr. Sharol Given to discuss amputation will also submit paperwork for transportation

## 2020-12-08 ENCOUNTER — Ambulatory Visit (INDEPENDENT_AMBULATORY_CARE_PROVIDER_SITE_OTHER): Payer: Self-pay | Admitting: Orthopedic Surgery

## 2020-12-08 ENCOUNTER — Other Ambulatory Visit: Payer: Self-pay | Admitting: Physician Assistant

## 2020-12-08 ENCOUNTER — Encounter: Payer: Self-pay | Admitting: Orthopedic Surgery

## 2020-12-08 ENCOUNTER — Ambulatory Visit (INDEPENDENT_AMBULATORY_CARE_PROVIDER_SITE_OTHER): Payer: Self-pay

## 2020-12-08 DIAGNOSIS — S82132S Displaced fracture of medial condyle of left tibia, sequela: Secondary | ICD-10-CM

## 2020-12-08 DIAGNOSIS — G8929 Other chronic pain: Secondary | ICD-10-CM

## 2020-12-08 DIAGNOSIS — L02612 Cutaneous abscess of left foot: Secondary | ICD-10-CM

## 2020-12-08 DIAGNOSIS — M25562 Pain in left knee: Secondary | ICD-10-CM

## 2020-12-08 NOTE — Progress Notes (Signed)
Office Visit Note   Patient: Todd Mccoy           Date of Birth: May 08, 1971           MRN: [id number] Visit Date: 12/08/2020              Requested by: [name], MD [place],  [place] [address] PCP: [name], MD  Chief Complaint  Patient presents with  . Left Foot - Routine Post Op    11/14/20 left 5th ray amputation       HPI: Patient is a 50 year old gentleman who presents for 2 separate.  #1 he is status post left fifth ray amputation with progressive dehiscence despite using Trental and nitroglycerin patch.  Patient has new ulceration medially to the left foot.  Patient has also had a left tibial plateau fracture which is greater than 4 months old patient was initially undergoing conservative therapy at [facility] and has had a Bledsoe brace.  Patient still smokes daily.  He states his blood sugars have been doing well and is on doxycycline.  Assessment & Plan: Visit Diagnoses:  1. Chronic pain of left knee   2. Cutaneous abscess of left foot   3. Closed fracture of medial portion of left tibial plateau, sequela     Plan: With the progressive gangrenous changes abscess and osteomyelitis of the left foot patient does not have further foot salvage intervention options.  We will plan for a left transtibial amputation, with the prosthetic socket I feel this should support his knee and allow him to ambulate with his native knee despite the medial tibial plateau fracture.  Patient currently lives with his son and niece and has a good supportive care system at home.  Follow-Up Instructions: No follow-ups on file.   Ortho Exam  Patient is alert, oriented, no adenopathy, well-dressed, normal affect, normal respiratory effort. Examination patient has progressive gangrenous changes of the lateral aspect the left foot with new ischemic ulcerative changes of the medial aspect the left foot.  The Doppler was used and he does have a  strong biphasic dorsalis pedis pulse.  Patient does not have sufficient soft tissue to consider foot salvage intervention options.  Patient does have varus alignment of the left knee but this does correct passively and anticipate with a socket this should provide varus and valgus stress for to the knee.  Imaging: XR Knee 1-2 Views Left  Result Date: 12/08/2020 2 view radiographs of the left knee shows progressive collapse of the medial tibial plateau fracture.  No images are attached to the encounter.  Labs: Lab Results  Component Value Date   HGBA1C 8.6 (H) 11/12/2020   HGBA1C 8.3 (H) 11/12/2020   HGBA1C 8.0 (A) 10/22/2020   ESRSEDRATE 126 (H) 03/24/2020   REPTSTATUS 11/14/2020 FINAL 11/12/2020   GRAMSTAIN  11/07/2019    NO WBC SEEN ABUNDANT GRAM POSITIVE COCCI FEW GRAM POSITIVE RODS    CULT  11/12/2020    NO GROWTH Performed at [facility], [place] [address] [place],  [address]    [name] 11/07/2019     Lab Results  Component Value Date   ALBUMIN 2.3 (L) 11/12/2020   ALBUMIN 2.4 (L) 04/17/2020   ALBUMIN 2.3 (L) 03/19/2020    Lab Results  Component Value Date   MG 1.6 (L) 11/13/2020   MG 1.5 09/07/2014   No results found for: VD25OH  No results found for: PREALBUMIN CBC EXTENDED Latest  Ref Rng & Units 11/15/2020 11/14/2020 11/13/2020  WBC 4.0 - 10.5 K/uL 13.0(H) 12.9(H) 15.3(H)  RBC 4.22 - 5.81 MIL/uL 2.80(L) 2.99(L) 2.92(L)  HGB 13.0 - 17.0 g/dL 7.2(L) 7.7(L) 7.6(L)  HCT 39.0 - 52.0 % 23.0(L) 24.8(L) 24.5(L)  PLT 150 - 400 K/uL 384 429(H) 429(H)  NEUTROABS 1.7 - 7.7 K/uL - - 12.5(H)  LYMPHSABS 0.7 - 4.0 K/uL - - 1.6     There is no height or weight on file to calculate BMI.  Orders:  Orders Placed This Encounter  Procedures  . XR Knee 1-2 Views Left   No orders of the defined types were placed in this encounter.    Procedures: No procedures performed  Clinical Data: No additional findings.  ROS:  All other  systems negative, except as noted in the HPI. Review of Systems  Objective: Vital Signs: There were no vitals taken for this visit.  Specialty Comments:  No specialty comments available.  PMFS History: Patient Active Problem List   Diagnosis Date Noted  . Osteomyelitis of fifth toe of left foot ([place])   . Severe protein-calorie malnutrition ([place])   . HTN (hypertension) 11/12/2020  . Osteomyelitis of second toe of right foot ([place])   . 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[place])   . Cellulitis and abscess of foot   . Toe osteomyelitis ([place]) 03/24/2020  . Necrotizing fasciitis ([place]) 09/08/2014  . Diabetes mellitus with hyperglycemia ([place]) 09/07/2014  . Tobacco abuse 09/07/2014  . Abscess of back   . Abscess of lower back 09/06/2014  . DM2 (diabetes mellitus, type 2) ([place]) 09/06/2014  . Sepsis ([place]) 09/06/2014  . Scrotal abscess 06/20/2014   Past Medical History:  Diagnosis Date  . Diabetes mellitus without complication ([place])   . Diabetic neuropathy ([place])   . 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[place]) 03/25/2020   pt states retinal eye damage- left worse than the right- recent visited MD   . 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[place]) 03/25/2020   pt states recent MD visit /left eye worse than right  . Hypertension     Family History  Problem Relation Age of Onset  . Diabetes Mother   . Diabetes Father   . Diabetes Brother   . Colon cancer Neg Hx   . Esophageal cancer Neg Hx   . Pancreatic cancer Neg Hx   . Stomach cancer Neg Hx   . Liver disease Neg Hx     Past Surgical History:  Procedure Laterality Date  . ABSCESS DRAINAGE     neck  . AMPUTATION Left 11/14/2020   Procedure: LEFT 5TH RAY AMPUTATION;  Surgeon: Newt Minion, MD;  Location: [facility];  Service: Orthopedics;  Laterality: Left;  . AMPUTATION TOE Right 03/25/2020   Procedure: AMPUTATION TOE;  Surgeon: Evelina Bucy, DPM;  Location: [facility];  Service: Podiatry;  Laterality: Right;  . INCISION AND DRAINAGE  ABSCESS Right 09/07/2014   Procedure: INCISION AND DRAINAGE ABSCESS RIGHT FLANK;  Surgeon: Jackolyn Confer, MD;  Location: [facility];  Service: General;  Laterality: Right;  . SCROTUM EXPLORATION     Social History   Occupational History  . Not on file  Tobacco Use  . Smoking status: Current Every Day Smoker    Packs/day: 0.50    Types: Cigarettes  . Smokeless tobacco: Never Used  Substance and Sexual Activity  . Alcohol use: No  . Drug use: No  . Sexual activity: Not on file

## 2020-12-08 NOTE — Progress Notes (Unsigned)
Office Visit Note   Patient: Todd Mccoy           Date of Birth: 08-Feb-1971           MRN: [id number] Visit Date: 12/08/2020              Requested by: No referring provider defined for this encounter. PCP: [name], MD  No chief complaint on file.     HPI: ***  Assessment & Plan: Visit Diagnoses: No diagnosis found.  Plan: ***  Follow-Up Instructions: No follow-ups on file.   Ortho Exam  Patient is alert, oriented, no adenopathy, well-dressed, normal affect, normal respiratory effort. ***  Imaging: XR Knee 1-2 Views Left  Result Date: 12/08/2020 2 view radiographs of the left knee shows progressive collapse of the medial tibial plateau fracture.  No images are attached to the encounter.  Labs: Lab Results  Component Value Date   HGBA1C 8.6 (H) 11/12/2020   HGBA1C 8.3 (H) 11/12/2020   HGBA1C 8.0 (A) 10/22/2020   ESRSEDRATE 126 (H) 03/24/2020   REPTSTATUS 11/14/2020 FINAL 11/12/2020   GRAMSTAIN  11/07/2019    NO WBC SEEN ABUNDANT GRAM POSITIVE COCCI FEW GRAM POSITIVE RODS    CULT  11/12/2020    NO GROWTH Performed at [facility], [place] [address], [place], [place] [address]    LABORGA STAPHYLOCOCCUS AUREUS 11/07/2019     Lab Results  Component Value Date   ALBUMIN 2.3 (L) 11/12/2020   ALBUMIN 2.4 (L) 04/17/2020   ALBUMIN 2.3 (L) 03/19/2020    Lab Results  Component Value Date   MG 1.6 (L) 11/13/2020   MG 1.5 09/07/2014   No results found for: VD25OH  No results found for: PREALBUMIN CBC EXTENDED Latest Ref Rng & Units 11/15/2020 11/14/2020 11/13/2020  WBC 4.0 - 10.5 K/uL 13.0(H) 12.9(H) 15.3(H)  RBC 4.22 - 5.81 MIL/uL 2.80(L) 2.99(L) 2.92(L)  HGB 13.0 - 17.0 g/dL 7.2(L) 7.7(L) 7.6(L)  HCT 39.0 - 52.0 % 23.0(L) 24.8(L) 24.5(L)  PLT 150 - 400 K/uL 384 429(H) 429(H)  NEUTROABS 1.7 - 7.7 K/uL - - 12.5(H)  LYMPHSABS 0.7 - 4.0 K/uL - - 1.6     There is no height or weight on file to calculate BMI.  Orders:  No orders  of the defined types were placed in this encounter.  No orders of the defined types were placed in this encounter.    Procedures: No procedures performed  Clinical Data: No additional findings.  ROS:  All other systems negative, except as noted in the HPI. Review of Systems  Objective: Vital Signs: There were no vitals taken for this visit.  Specialty Comments:  No specialty comments available.  PMFS History: Patient Active Problem List   Diagnosis Date Noted  . Osteomyelitis of fifth toe of left foot ([place])   . Severe protein-calorie malnutrition ([place])   . HTN (hypertension) 11/12/2020  . Osteomyelitis of second toe of right foot ([place])   . 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[place])   . Cellulitis and abscess of foot   . Toe osteomyelitis ([place]) 03/24/2020  . Necrotizing fasciitis ([place]) 09/08/2014  . Diabetes mellitus with hyperglycemia ([place]) 09/07/2014  . Tobacco abuse 09/07/2014  . Abscess of back   . Abscess of lower back 09/06/2014  . DM2 (diabetes mellitus, type 2) ([place]) 09/06/2014  . Sepsis ([place]) 09/06/2014  . Scrotal abscess 06/20/2014   Past Medical History:  Diagnosis Date  . Diabetes mellitus without complication ([place])   .  Diabetic neuropathy ([place])   . 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[place]) 03/25/2020   pt states retinal eye damage- left worse than the right- recent visited MD   . 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[place]) 03/25/2020   pt states recent MD visit /left eye worse than right  . Hypertension     Family History  Problem Relation Age of Onset  . Diabetes Mother   . Diabetes Father   . Diabetes Brother   . Colon cancer Neg Hx   . Esophageal cancer Neg Hx   . Pancreatic cancer Neg Hx   . Stomach cancer Neg Hx   . Liver disease Neg Hx     Past Surgical History:  Procedure Laterality Date  . ABSCESS DRAINAGE     neck  . AMPUTATION Left 11/14/2020   Procedure: LEFT 5TH RAY AMPUTATION;  Surgeon: Newt Minion, MD;  Location: [facility];   Service: Orthopedics;  Laterality: Left;  . AMPUTATION TOE Right 03/25/2020   Procedure: AMPUTATION TOE;  Surgeon: Evelina Bucy, DPM;  Location[facility];  Service: Podiatry;  Laterality: Right;  . INCISION AND DRAINAGE ABSCESS Right 09/07/2014   Procedure: INCISION AND DRAINAGE ABSCESS RIGHT FLANK;  Surgeon: Jackolyn Confer, MD;  Location: [facility];  Service: General;  Laterality: Right;  . SCROTUM EXPLORATION     Social History   Occupational History  . Not on file  Tobacco Use  . Smoking status: Current Every Day Smoker    Packs/day: 0.50    Types: Cigarettes  . Smokeless tobacco: Never Used  Substance and Sexual Activity  . Alcohol use: No  . Drug use: No  . Sexual activity: Not on file

## 2020-12-09 ENCOUNTER — Other Ambulatory Visit: Payer: Self-pay

## 2020-12-09 ENCOUNTER — Other Ambulatory Visit (HOSPITAL_COMMUNITY): Payer: Medicaid Other

## 2020-12-09 ENCOUNTER — Encounter (HOSPITAL_COMMUNITY): Payer: Self-pay | Admitting: Orthopedic Surgery

## 2020-12-09 NOTE — Progress Notes (Signed)
Spoke with pt for pre-op call. Pt denies cardiac history. Pt is a type 2 Diabetic. Last A1C was 8.6 on 11/12/20. Pt states his fasting blood sugar is usually between 140-160. Instructed pt to take 1/2 of his regular dose of Lantus Insulin in the AM, he will take 15 units. Instructed pt to check his blood sugar when he gets up in the AM and every 2 hours until he leaves for the hospital. If blood sugar is 70 or below, treat with 1/2 cup of clear juice (apple or cranberry) and recheck blood sugar 15 minutes after drinking juice. If blood sugar continues to be 70 or below, call the Short Stay department and ask to speak to a nurse. Pt voiced understanding.  Pt is scheduled for a Covid test today. He states if his brother comes, he will go get the test. If not, he will have to do it once he arrives in the AM. I instructed him to arrive at 10:30 AM if he does NOT get the Covid test done today. If he does, he will arrive at 11:00 AM. He states he will try his best to get there today.

## 2020-12-10 ENCOUNTER — Inpatient Hospital Stay (HOSPITAL_COMMUNITY): Payer: Medicaid Other | Admitting: Certified Registered"

## 2020-12-10 ENCOUNTER — Encounter (HOSPITAL_COMMUNITY): Payer: Self-pay | Admitting: Orthopedic Surgery

## 2020-12-10 ENCOUNTER — Encounter (HOSPITAL_COMMUNITY)
Admission: RE | Disposition: A | Discharge status: Home Health | Payer: Self-pay | Source: Home / Self Care | Attending: Orthopedic Surgery

## 2020-12-10 ENCOUNTER — Inpatient Hospital Stay (HOSPITAL_COMMUNITY): Payer: Medicaid Other

## 2020-12-10 ENCOUNTER — Inpatient Hospital Stay (HOSPITAL_COMMUNITY)
Admission: RE | Admit: 2020-12-10 | Discharge: 2020-12-22 | DRG: 617 | Disposition: A | Discharge status: Home Health | Payer: Medicaid Other | Attending: Family Medicine | Admitting: Family Medicine

## 2020-12-10 ENCOUNTER — Other Ambulatory Visit: Payer: Self-pay

## 2020-12-10 DIAGNOSIS — D631 Anemia in chronic kidney disease: Secondary | ICD-10-CM | POA: Diagnosis present

## 2020-12-10 DIAGNOSIS — E11628 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 complications: Secondary | ICD-10-CM | POA: Diagnosis present

## 2020-12-10 DIAGNOSIS — F419 Anxiety disorder, unspecified: Secondary | ICD-10-CM | POA: Diagnosis not present

## 2020-12-10 DIAGNOSIS — L97529 Non-pressure chronic ulcer of other part of left foot with unspecified severity: Secondary | ICD-10-CM | POA: Diagnosis present

## 2020-12-10 DIAGNOSIS — L02612 Cutaneous abscess of left foot: Secondary | ICD-10-CM | POA: Insufficient documentation

## 2020-12-10 DIAGNOSIS — E785 Hyperlipidemia, unspecified: Secondary | ICD-10-CM | POA: Diagnosis not present

## 2020-12-10 DIAGNOSIS — S82142A Displaced bicondylar fracture of left tibia, initial encounter for closed fracture: Secondary | ICD-10-CM | POA: Diagnosis present

## 2020-12-10 DIAGNOSIS — E1140 Type 2 diabetes mellitus with diabetic neuropathy, unspecified: Secondary | ICD-10-CM | POA: Diagnosis present

## 2020-12-10 DIAGNOSIS — E1165 Type 2 diabetes mellitus with hyperglycemia: Secondary | ICD-10-CM | POA: Diagnosis not present

## 2020-12-10 DIAGNOSIS — E1159 Type 2 diabetes mellitus with other circulatory complications: Secondary | ICD-10-CM

## 2020-12-10 DIAGNOSIS — R140 Abdominal distension (gaseous): Secondary | ICD-10-CM

## 2020-12-10 DIAGNOSIS — N179 Acute kidney failure, unspecified: Secondary | ICD-10-CM

## 2020-12-10 DIAGNOSIS — E1152 Type 2 diabetes mellitus with diabetic peripheral angiopathy with gangrene: Secondary | ICD-10-CM | POA: Diagnosis present

## 2020-12-10 DIAGNOSIS — E11621 Type 2 diabetes mellitus with foot ulcer: Secondary | ICD-10-CM | POA: Diagnosis not present

## 2020-12-10 DIAGNOSIS — I10 Essential (primary) hypertension: Secondary | ICD-10-CM | POA: Diagnosis present

## 2020-12-10 DIAGNOSIS — Z833 Family history of diabetes mellitus: Secondary | ICD-10-CM

## 2020-12-10 DIAGNOSIS — E860 Dehydration: Secondary | ICD-10-CM | POA: Diagnosis not present

## 2020-12-10 DIAGNOSIS — Z20822 Contact with and (suspected) exposure to covid-19: Secondary | ICD-10-CM | POA: Diagnosis not present

## 2020-12-10 DIAGNOSIS — N183 Chronic kidney disease, stage 3 unspecified: Secondary | ICD-10-CM | POA: Diagnosis present

## 2020-12-10 DIAGNOSIS — G8929 Other chronic pain: Secondary | ICD-10-CM | POA: Diagnosis present

## 2020-12-10 DIAGNOSIS — I70248 Atherosclerosis of native arteries of left leg with ulceration of other part of lower left leg: Secondary | ICD-10-CM | POA: Diagnosis not present

## 2020-12-10 DIAGNOSIS — Z597 Insufficient social insurance and welfare support: Secondary | ICD-10-CM

## 2020-12-10 DIAGNOSIS — I129 Hypertensive chronic kidney disease with stage 1 through stage 4 chronic kidney disease, or unspecified chronic kidney disease: Secondary | ICD-10-CM | POA: Diagnosis not present

## 2020-12-10 DIAGNOSIS — E119 Type 2 diabetes mellitus without complications: Secondary | ICD-10-CM

## 2020-12-10 DIAGNOSIS — N1831 Chronic kidney disease, stage 3a: Secondary | ICD-10-CM | POA: Diagnosis present

## 2020-12-10 DIAGNOSIS — E11319 Type 2 diabetes mellitus with unspecified diabetic retinopathy without macular edema: Secondary | ICD-10-CM | POA: Diagnosis not present

## 2020-12-10 DIAGNOSIS — F17210 Nicotine dependence, cigarettes, uncomplicated: Secondary | ICD-10-CM | POA: Diagnosis present

## 2020-12-10 DIAGNOSIS — D509 Iron deficiency anemia, unspecified: Secondary | ICD-10-CM | POA: Diagnosis present

## 2020-12-10 DIAGNOSIS — D62 Acute posthemorrhagic anemia: Secondary | ICD-10-CM | POA: Diagnosis not present

## 2020-12-10 DIAGNOSIS — Z720 Tobacco use: Secondary | ICD-10-CM | POA: Diagnosis present

## 2020-12-10 DIAGNOSIS — Z89421 Acquired absence of other right toe(s): Secondary | ICD-10-CM

## 2020-12-10 DIAGNOSIS — E875 Hyperkalemia: Secondary | ICD-10-CM | POA: Diagnosis not present

## 2020-12-10 DIAGNOSIS — E1122 Type 2 diabetes mellitus with diabetic chronic kidney disease: Secondary | ICD-10-CM | POA: Diagnosis not present

## 2020-12-10 DIAGNOSIS — L97924 Non-pressure chronic ulcer of unspecified part of left lower leg with necrosis of bone: Secondary | ICD-10-CM | POA: Diagnosis not present

## 2020-12-10 DIAGNOSIS — R0602 Shortness of breath: Secondary | ICD-10-CM

## 2020-12-10 DIAGNOSIS — E1169 Type 2 diabetes mellitus with other specified complication: Principal | ICD-10-CM | POA: Diagnosis present

## 2020-12-10 DIAGNOSIS — Z89512 Acquired absence of left leg below knee: Secondary | ICD-10-CM

## 2020-12-10 DIAGNOSIS — H547 Unspecified visual loss: Secondary | ICD-10-CM | POA: Diagnosis present

## 2020-12-10 DIAGNOSIS — M86272 Subacute osteomyelitis, left ankle and foot: Secondary | ICD-10-CM | POA: Diagnosis present

## 2020-12-10 DIAGNOSIS — K5900 Constipation, unspecified: Secondary | ICD-10-CM

## 2020-12-10 DIAGNOSIS — M869 Osteomyelitis, unspecified: Secondary | ICD-10-CM | POA: Diagnosis not present

## 2020-12-10 DIAGNOSIS — Z888 Allergy status to other drugs, medicaments and biological substances status: Secondary | ICD-10-CM | POA: Diagnosis not present

## 2020-12-10 DIAGNOSIS — N5089 Other specified disorders of the male genital organs: Secondary | ICD-10-CM | POA: Diagnosis present

## 2020-12-10 DIAGNOSIS — E8770 Fluid overload, unspecified: Secondary | ICD-10-CM | POA: Diagnosis present

## 2020-12-10 DIAGNOSIS — E1142 Type 2 diabetes mellitus with diabetic polyneuropathy: Secondary | ICD-10-CM

## 2020-12-10 DIAGNOSIS — N184 Chronic kidney disease, stage 4 (severe): Secondary | ICD-10-CM

## 2020-12-10 HISTORY — DX: Hyperlipidemia, unspecified: E78.5

## 2020-12-10 HISTORY — DX: Anxiety disorder, unspecified: F41.9

## 2020-12-10 HISTORY — DX: Chronic kidney disease, stage 4 (severe): N18.4

## 2020-12-10 HISTORY — DX: Pneumonia, unspecified organism: J18.9

## 2020-12-10 HISTORY — PX: AMPUTATION: SHX166

## 2020-12-10 HISTORY — DX: Anemia, unspecified: D64.9

## 2020-12-10 HISTORY — PX: LEG AMPUTATION BELOW KNEE: SHX694

## 2020-12-10 LAB — CBC
HCT: 26.2 % — ABNORMAL LOW (ref 39.0–52.0)
Hemoglobin: 7.8 g/dL — ABNORMAL LOW (ref 13.0–17.0)
MCH: 24 pg — ABNORMAL LOW (ref 26.0–34.0)
MCHC: 29.8 g/dL — ABNORMAL LOW (ref 30.0–36.0)
MCV: 80.6 fL (ref 80.0–100.0)
Platelets: 608 10*3/uL — ABNORMAL HIGH (ref 150–400)
RBC: 3.25 MIL/uL — ABNORMAL LOW (ref 4.22–5.81)
RDW: 20.3 % — ABNORMAL HIGH (ref 11.5–15.5)
WBC: 35.8 10*3/uL — ABNORMAL HIGH (ref 4.0–10.5)
nRBC: 0 % (ref 0.0–0.2)

## 2020-12-10 LAB — GLUCOSE, CAPILLARY
Glucose-Capillary: 265 mg/dL — ABNORMAL HIGH (ref 70–99)
Glucose-Capillary: 284 mg/dL — ABNORMAL HIGH (ref 70–99)
Glucose-Capillary: 279 mg/dL — ABNORMAL HIGH (ref 70–99)
Glucose-Capillary: 284 mg/dL — ABNORMAL HIGH (ref 70–99)
Glucose-Capillary: 213 mg/dL — ABNORMAL HIGH (ref 70–99)
Glucose-Capillary: 218 mg/dL — ABNORMAL HIGH (ref 70–99)
Glucose-Capillary: 210 mg/dL — ABNORMAL HIGH (ref 70–99)

## 2020-12-10 LAB — SEDIMENTATION RATE: Sed Rate: 134 mm/hr — ABNORMAL HIGH (ref 0–16)

## 2020-12-10 LAB — ABO/RH: ABO/RH(D): B POS

## 2020-12-10 LAB — SARS CORONAVIRUS 2 BY RT PCR (HOSPITAL ORDER, PERFORMED IN ~~LOC~~ HOSPITAL LAB): SARS Coronavirus 2: NEGATIVE

## 2020-12-10 LAB — PREALBUMIN: Prealbumin: 5.5 mg/dL — ABNORMAL LOW (ref 18–38)

## 2020-12-10 LAB — PREPARE RBC (CROSSMATCH)

## 2020-12-10 LAB — C-REACTIVE PROTEIN: CRP: 29.8 mg/dL — ABNORMAL HIGH (ref <1.0)

## 2020-12-10 SURGERY — AMPUTATION BELOW KNEE
Anesthesia: Regional | Site: Knee | Laterality: Left

## 2020-12-10 MED ORDER — LOSARTAN POTASSIUM 50 MG PO TABS: 100.0000 mg | ORAL_TABLET | Freq: Every day | ORAL | Status: DC

## 2020-12-10 MED ORDER — PROPOFOL 500 MG/50ML IV EMUL
INTRAVENOUS | Status: DC | PRN
  Administered 2020-12-10: 100 ug/kg/min via INTRAVENOUS
  Administered 2020-12-10: 75 ug/kg/min via INTRAVENOUS

## 2020-12-10 MED ORDER — LACTATED RINGERS IV SOLN: INTRAVENOUS | Status: DC

## 2020-12-10 MED ORDER — PANTOPRAZOLE SODIUM 40 MG PO TBEC
40.0000 mg | DELAYED_RELEASE_TABLET | Freq: Every day | ORAL | Status: DC
  Administered 2020-12-11 – 2020-12-12 (×2): 40 mg via ORAL
  Filled 2020-12-10 (×2): qty 1

## 2020-12-10 MED ORDER — PROPOFOL 10 MG/ML IV BOLUS
INTRAVENOUS | Status: DC | PRN
  Administered 2020-12-10: 20 mg via INTRAVENOUS

## 2020-12-10 MED ORDER — DOCUSATE SODIUM 100 MG PO CAPS
100.0000 mg | ORAL_CAPSULE | Freq: Two times a day (BID) | ORAL | Status: DC
  Administered 2020-12-10 – 2020-12-11 (×2): 100 mg via ORAL
  Filled 2020-12-10 (×2): qty 1

## 2020-12-10 MED ORDER — BUPIVACAINE LIPOSOME 1.3 % IJ SUSP
INTRAMUSCULAR | Status: DC | PRN
  Administered 2020-12-10: 4 mL via PERINEURAL
  Administered 2020-12-10: 6 mL via PERINEURAL

## 2020-12-10 MED ORDER — ACETAMINOPHEN 325 MG PO TABS
325.0000 mg | ORAL_TABLET | Freq: Four times a day (QID) | ORAL | Status: DC | PRN
  Administered 2020-12-12 – 2020-12-13 (×2): 650 mg via ORAL
  Filled 2020-12-10 (×4): qty 2

## 2020-12-10 MED ORDER — ASCORBIC ACID 500 MG PO TABS
1000.0000 mg | ORAL_TABLET | Freq: Every day | ORAL | Status: DC
  Administered 2020-12-11 – 2020-12-22 (×12): 1000 mg via ORAL
  Filled 2020-12-10 (×12): qty 2

## 2020-12-10 MED ORDER — BISACODYL 5 MG PO TBEC: 5.0000 mg | DELAYED_RELEASE_TABLET | Freq: Every day | ORAL | Status: DC | PRN

## 2020-12-10 MED ORDER — GABAPENTIN 300 MG PO CAPS
300.0000 mg | ORAL_CAPSULE | Freq: Every day | ORAL | Status: DC
  Administered 2020-12-10 – 2020-12-21 (×12): 300 mg via ORAL
  Filled 2020-12-10 (×12): qty 1

## 2020-12-10 MED ORDER — TRANEXAMIC ACID-NACL 1000-0.7 MG/100ML-% IV SOLN
1000.0000 mg | Freq: Once | INTRAVENOUS | Status: AC
  Administered 2020-12-10: 1000 mg via INTRAVENOUS
  Filled 2020-12-10: qty 100

## 2020-12-10 MED ORDER — MIDAZOLAM HCL 2 MG/2ML IJ SOLN: 1.0000 mg | Freq: Once | INTRAMUSCULAR | Status: AC

## 2020-12-10 MED ORDER — DOCUSATE SODIUM 100 MG PO CAPS: 100.0000 mg | ORAL_CAPSULE | Freq: Every day | ORAL | Status: DC

## 2020-12-10 MED ORDER — INSULIN ASPART 100 UNIT/ML IJ SOLN
INTRAMUSCULAR | Status: AC
  Filled 2020-12-10: qty 1

## 2020-12-10 MED ORDER — MIDAZOLAM HCL 2 MG/2ML IJ SOLN
INTRAMUSCULAR | Status: AC
  Filled 2020-12-10: qty 2

## 2020-12-10 MED ORDER — JUVEN PO PACK
1.0000 | PACK | Freq: Two times a day (BID) | ORAL | Status: DC
  Administered 2020-12-11 – 2020-12-22 (×22): 1 via ORAL
  Filled 2020-12-10 (×21): qty 1

## 2020-12-10 MED ORDER — METRONIDAZOLE 500 MG/100ML IV SOLN
500.0000 mg | Freq: Three times a day (TID) | INTRAVENOUS | Status: AC
  Administered 2020-12-10 – 2020-12-11 (×4): 500 mg via INTRAVENOUS
  Filled 2020-12-10 (×4): qty 100

## 2020-12-10 MED ORDER — INSULIN ASPART 100 UNIT/ML IJ SOLN
0.0000 [IU] | Freq: Three times a day (TID) | INTRAMUSCULAR | Status: DC
  Administered 2020-12-11: 8 [IU] via SUBCUTANEOUS

## 2020-12-10 MED ORDER — HYDROMORPHONE HCL 1 MG/ML IJ SOLN
INTRAMUSCULAR | Status: AC
  Filled 2020-12-10: qty 1

## 2020-12-10 MED ORDER — GUAIFENESIN-DM 100-10 MG/5ML PO SYRP: 15.0000 mL | ORAL_SOLUTION | ORAL | Status: DC | PRN

## 2020-12-10 MED ORDER — ATORVASTATIN CALCIUM 10 MG PO TABS
20.0000 mg | ORAL_TABLET | Freq: Every day | ORAL | Status: DC
  Administered 2020-12-11 – 2020-12-22 (×12): 20 mg via ORAL
  Filled 2020-12-10 (×12): qty 2

## 2020-12-10 MED ORDER — CEFAZOLIN SODIUM-DEXTROSE 2-4 GM/100ML-% IV SOLN
2.0000 g | INTRAVENOUS | Status: AC
  Administered 2020-12-10: 2 g via INTRAVENOUS
  Filled 2020-12-10: qty 100

## 2020-12-10 MED ORDER — PHENOL 1.4 % MT LIQD
1.0000 | OROMUCOSAL | Status: DC | PRN
  Filled 2020-12-10: qty 177

## 2020-12-10 MED ORDER — ONDANSETRON HCL 4 MG/2ML IJ SOLN
4.0000 mg | Freq: Four times a day (QID) | INTRAMUSCULAR | Status: DC | PRN
  Administered 2020-12-10: 4 mg via INTRAVENOUS
  Filled 2020-12-10 (×2): qty 2

## 2020-12-10 MED ORDER — CEFAZOLIN SODIUM-DEXTROSE 2-4 GM/100ML-% IV SOLN: 2.0000 g | Freq: Three times a day (TID) | INTRAVENOUS | Status: DC

## 2020-12-10 MED ORDER — ALUM & MAG HYDROXIDE-SIMETH 200-200-20 MG/5ML PO SUSP
15.0000 mL | ORAL | Status: DC | PRN
  Administered 2020-12-11 – 2020-12-13 (×3): 30 mL via ORAL
  Filled 2020-12-10 (×4): qty 30

## 2020-12-10 MED ORDER — MEPERIDINE HCL 25 MG/ML IJ SOLN: 6.2500 mg | INTRAMUSCULAR | Status: DC | PRN

## 2020-12-10 MED ORDER — INSULIN GLARGINE 100 UNIT/ML SOLOSTAR PEN: 25.0000 [IU] | PEN_INJECTOR | Freq: Every morning | SUBCUTANEOUS | Status: DC

## 2020-12-10 MED ORDER — PROPOFOL 1000 MG/100ML IV EMUL
INTRAVENOUS | Status: AC
  Filled 2020-12-10: qty 100

## 2020-12-10 MED ORDER — CHLORHEXIDINE GLUCONATE 0.12 % MT SOLN
15.0000 mL | Freq: Once | OROMUCOSAL | Status: AC
  Administered 2020-12-10: 15 mL via OROMUCOSAL
  Filled 2020-12-10: qty 15

## 2020-12-10 MED ORDER — BUPIVACAINE HCL (PF) 0.5 % IJ SOLN
INTRAMUSCULAR | Status: DC | PRN
  Administered 2020-12-10: 10 mL via PERINEURAL
  Administered 2020-12-10: 20 mL via PERINEURAL

## 2020-12-10 MED ORDER — 0.9 % SODIUM CHLORIDE (POUR BTL) OPTIME
TOPICAL | Status: DC | PRN
  Administered 2020-12-10: 1000 mL

## 2020-12-10 MED ORDER — OXYCODONE HCL 5 MG PO TABS
5.0000 mg | ORAL_TABLET | ORAL | Status: DC | PRN
  Administered 2020-12-10 – 2020-12-11 (×2): 10 mg via ORAL
  Administered 2020-12-11: 5 mg via ORAL
  Administered 2020-12-12 (×2): 10 mg via ORAL
  Administered 2020-12-13: 5 mg via ORAL
  Administered 2020-12-13 (×2): 10 mg via ORAL
  Administered 2020-12-14 – 2020-12-18 (×5): 5 mg via ORAL
  Administered 2020-12-19: 10 mg via ORAL
  Administered 2020-12-19 – 2020-12-22 (×4): 5 mg via ORAL
  Filled 2020-12-10: qty 1
  Filled 2020-12-10 (×3): qty 2
  Filled 2020-12-10 (×2): qty 1
  Filled 2020-12-10: qty 2
  Filled 2020-12-10: qty 1
  Filled 2020-12-10: qty 2
  Filled 2020-12-10: qty 1
  Filled 2020-12-10: qty 2
  Filled 2020-12-10 (×3): qty 1
  Filled 2020-12-10 (×2): qty 2
  Filled 2020-12-10 (×3): qty 1
  Filled 2020-12-10 (×2): qty 2

## 2020-12-10 MED ORDER — PROMETHAZINE HCL 25 MG/ML IJ SOLN: 6.2500 mg | INTRAMUSCULAR | Status: DC | PRN

## 2020-12-10 MED ORDER — MAGNESIUM CITRATE PO SOLN
1.0000 | Freq: Once | ORAL | Status: AC | PRN
  Administered 2020-12-11: 1 via ORAL
  Filled 2020-12-10: qty 296

## 2020-12-10 MED ORDER — HYDRALAZINE HCL 20 MG/ML IJ SOLN: 5.0000 mg | INTRAMUSCULAR | Status: DC | PRN

## 2020-12-10 MED ORDER — SODIUM CHLORIDE 0.9 % IV SOLN
2.0000 g | INTRAVENOUS | Status: AC
  Administered 2020-12-10 – 2020-12-11 (×2): 2 g via INTRAVENOUS
  Filled 2020-12-10 (×2): qty 20

## 2020-12-10 MED ORDER — MIDAZOLAM HCL 2 MG/2ML IJ SOLN
INTRAMUSCULAR | Status: AC
  Administered 2020-12-10: 1 mg via INTRAVENOUS
  Filled 2020-12-10: qty 2

## 2020-12-10 MED ORDER — POLYETHYLENE GLYCOL 3350 17 G PO PACK: 17.0000 g | PACK | Freq: Every day | ORAL | Status: DC | PRN

## 2020-12-10 MED ORDER — HYDROMORPHONE HCL 1 MG/ML IJ SOLN: 0.5000 mg | INTRAMUSCULAR | Status: DC | PRN

## 2020-12-10 MED ORDER — VANCOMYCIN HCL 1000 MG/200ML IV SOLN
1000.0000 mg | Freq: Once | INTRAVENOUS | Status: AC
  Filled 2020-12-10: qty 200

## 2020-12-10 MED ORDER — SODIUM CHLORIDE 0.9 % IV SOLN: INTRAVENOUS | Status: DC

## 2020-12-10 MED ORDER — ORAL CARE MOUTH RINSE: 15.0000 mL | Freq: Once | OROMUCOSAL | Status: AC

## 2020-12-10 MED ORDER — INSULIN GLARGINE 100 UNIT/ML ~~LOC~~ SOLN
25.0000 [IU] | Freq: Every day | SUBCUTANEOUS | Status: DC
  Administered 2020-12-11: 25 [IU] via SUBCUTANEOUS
  Filled 2020-12-10: qty 0.25

## 2020-12-10 MED ORDER — SODIUM CHLORIDE 0.9% IV SOLUTION: Freq: Once | INTRAVENOUS | Status: DC

## 2020-12-10 MED ORDER — HYDROCHLOROTHIAZIDE 25 MG PO TABS: 25.0000 mg | ORAL_TABLET | Freq: Every day | ORAL | Status: DC

## 2020-12-10 MED ORDER — LOSARTAN POTASSIUM-HCTZ 100-25 MG PO TABS: 1.0000 | ORAL_TABLET | Freq: Every day | ORAL | Status: DC

## 2020-12-10 MED ORDER — VANCOMYCIN HCL IN DEXTROSE 1-5 GM/200ML-% IV SOLN
INTRAVENOUS | Status: AC
  Administered 2020-12-10: 1000 mg
  Filled 2020-12-10: qty 200

## 2020-12-10 MED ORDER — FENTANYL CITRATE (PF) 100 MCG/2ML IJ SOLN: 50.0000 ug | Freq: Once | INTRAMUSCULAR | Status: AC

## 2020-12-10 MED ORDER — INSULIN ASPART 100 UNIT/ML IJ SOLN
8.0000 [IU] | Freq: Once | INTRAMUSCULAR | Status: AC
  Administered 2020-12-10: 8 [IU] via SUBCUTANEOUS

## 2020-12-10 MED ORDER — FENTANYL CITRATE (PF) 100 MCG/2ML IJ SOLN
INTRAMUSCULAR | Status: AC
  Administered 2020-12-10: 50 ug via INTRAVENOUS
  Filled 2020-12-10: qty 2

## 2020-12-10 MED ORDER — ZINC SULFATE 220 (50 ZN) MG PO CAPS
220.0000 mg | ORAL_CAPSULE | Freq: Every day | ORAL | Status: DC
  Administered 2020-12-11 – 2020-12-22 (×13): 220 mg via ORAL
  Filled 2020-12-10 (×12): qty 1

## 2020-12-10 MED ORDER — HYDROMORPHONE HCL 1 MG/ML IJ SOLN
0.2500 mg | INTRAMUSCULAR | Status: DC | PRN
  Administered 2020-12-10: 0.5 mg via INTRAVENOUS

## 2020-12-10 SURGICAL SUPPLY — 38 items
BLADE SAW RECIP 87.9 MT (BLADE) ×2 IMPLANT
BLADE SURG 21 STRL SS (BLADE) ×2 IMPLANT
BNDG COHESIVE 6X5 TAN STRL LF (GAUZE/BANDAGES/DRESSINGS) IMPLANT
CANISTER WOUND CARE 500ML ATS (WOUND CARE) ×2 IMPLANT
COVER SURGICAL LIGHT HANDLE (MISCELLANEOUS) ×2 IMPLANT
COVER WAND RF STERILE (DRAPES) IMPLANT
CUFF TOURN SGL QUICK 34 (TOURNIQUET CUFF) ×2
CUFF TRNQT CYL 34X4.125X (TOURNIQUET CUFF) ×1 IMPLANT
DRAPE DERMATAC (DRAPES) ×2 IMPLANT
DRAPE INCISE IOBAN 66X45 STRL (DRAPES) ×2 IMPLANT
DRAPE U-SHAPE 47X51 STRL (DRAPES) ×2 IMPLANT
DRESSING PREVENA PLUS CUSTOM (GAUZE/BANDAGES/DRESSINGS) ×1 IMPLANT
DRSG PREVENA PLUS CUSTOM (GAUZE/BANDAGES/DRESSINGS) ×2
DURAPREP 26ML APPLICATOR (WOUND CARE) ×2 IMPLANT
ELECT REM PT RETURN 9FT ADLT (ELECTROSURGICAL) ×2
ELECTRODE REM PT RTRN 9FT ADLT (ELECTROSURGICAL) ×1 IMPLANT
GLOVE BIOGEL PI IND STRL 9 (GLOVE) ×1 IMPLANT
GLOVE BIOGEL PI INDICATOR 9 (GLOVE) ×1
GLOVE SURG ORTHO 9.0 STRL STRW (GLOVE) ×2 IMPLANT
GOWN STRL REUS W/ TWL XL LVL3 (GOWN DISPOSABLE) ×2 IMPLANT
GOWN STRL REUS W/TWL XL LVL3 (GOWN DISPOSABLE) ×4
KIT BASIN OR (CUSTOM PROCEDURE TRAY) ×2 IMPLANT
KIT TURNOVER KIT B (KITS) ×2 IMPLANT
MANIFOLD NEPTUNE II (INSTRUMENTS) ×2 IMPLANT
NS IRRIG 1000ML POUR BTL (IV SOLUTION) ×2 IMPLANT
PACK ORTHO EXTREMITY (CUSTOM PROCEDURE TRAY) ×2 IMPLANT
PAD ARMBOARD 7.5X6 YLW CONV (MISCELLANEOUS) ×2 IMPLANT
PREVENA RESTOR ARTHOFORM 46X30 (CANNISTER) ×2 IMPLANT
SPONGE LAP 18X18 RF (DISPOSABLE) IMPLANT
STAPLER VISISTAT 35W (STAPLE) IMPLANT
STOCKINETTE IMPERVIOUS LG (DRAPES) ×2 IMPLANT
SUT ETHILON 2 0 PSLX (SUTURE) IMPLANT
SUT SILK 2 0 (SUTURE) ×2
SUT SILK 2-0 18XBRD TIE 12 (SUTURE) ×1 IMPLANT
SUT VIC AB 1 CTX 27 (SUTURE) ×4 IMPLANT
TOWEL GREEN STERILE (TOWEL DISPOSABLE) ×2 IMPLANT
TUBE CONNECTING 12X1/4 (SUCTIONS) ×2 IMPLANT
YANKAUER SUCT BULB TIP NO VENT (SUCTIONS) ×2 IMPLANT

## 2020-12-10 NOTE — Anesthesia Preprocedure Evaluation (Addendum)
Anesthesia Evaluation  Patient identified by MRN, date of birth, ID band Patient awake    Reviewed: Allergy & Precautions, NPO status , Patient's Chart, lab work & pertinent test results  Airway Mallampati: II  TM Distance: >3 FB Neck ROM: Full    Dental  (+) Dental Advisory Given   Pulmonary shortness of breath, pneumonia, Current Smoker and Patient abstained from smoking.,    Pulmonary exam normal breath sounds clear to auscultation       Cardiovascular hypertension, Pt. on medications Normal cardiovascular exam Rhythm:Regular Rate:Normal     Neuro/Psych PSYCHIATRIC DISORDERS Anxiety negative neurological ROS     GI/Hepatic negative GI ROS, Neg liver ROS,   Endo/Other  diabetes, Poorly Controlled  Renal/GU negative Renal ROS     Musculoskeletal negative musculoskeletal ROS (+)   Abdominal   Peds  Hematology negative hematology ROS (+)   Anesthesia Other Findings   Reproductive/Obstetrics                           Anesthesia Physical Anesthesia Plan  ASA: III  Anesthesia Plan: Regional   Post-op Pain Management:    Induction: Intravenous  PONV Risk Score and Plan: 1 and Treatment may vary due to age or medical condition, Propofol infusion and TIVA  Airway Management Planned: Natural Airway  Additional Equipment: None  Intra-op Plan:   Post-operative Plan: Extubation in OR  Informed Consent: I have reviewed the patients History and Physical, chart, labs and discussed the procedure including the risks, benefits and alternatives for the proposed anesthesia with the patient or authorized representative who has indicated his/her understanding and acceptance.     Dental advisory given  Plan Discussed with: CRNA  Anesthesia Plan Comments:       Anesthesia Quick Evaluation

## 2020-12-10 NOTE — Transfer of Care (Signed)
Immediate Anesthesia Transfer of Care Note  Patient: [name]  Procedure(s) Performed: LEFT BELOW KNEE AMPUTATION (Left Knee)  Patient Location: PACU  Anesthesia Type:MAC combined with regional for post-op pain  Level of Consciousness: drowsy and patient cooperative  Airway & Oxygen Therapy: Patient Spontanous Breathing and Patient connected to nasal cannula oxygen  Post-op Assessment: Report given to RN, Post -op Vital signs reviewed and stable and Patient moving all extremities  Post vital signs: Reviewed and stable  Last Vitals:  Vitals Value Taken Time  BP 123/78 12/10/20 1404  Temp    Pulse 102 12/10/20 1405  Resp 26 12/10/20 1405  SpO2 96 % 12/10/20 1405  Vitals shown include unvalidated device data.  Last Pain:  Vitals:   12/10/20 1139  TempSrc: Oral  PainSc: 6          Complications: No complications documented.

## 2020-12-10 NOTE — Progress Notes (Signed)
Orthopedic Tech Progress Note Patient Details:  Todd Mccoy 07/31/1970 [id number] Called in order to [facility] for a VIVE PROTOCOL BK Patient ID: Todd Mccoy, male   DOB: 09-26-1970, 50 y.o.   MRN: [id number]   Todd Mccoy 12/10/2020, 6:11 PM

## 2020-12-10 NOTE — Anesthesia Procedure Notes (Signed)
Anesthesia Regional Block: Adductor canal block   Pre-Anesthetic Checklist: ,, timeout performed, Correct Patient, Correct Site, Correct Laterality, Correct Procedure, Correct Position, site marked, Risks and benefits discussed,  Surgical consent,  Pre-op evaluation,  At surgeon's request and post-op pain management  Laterality: Left  Prep: chloraprep       Needles:  Injection technique: Single-shot  Needle Type: Stimiplex     Needle Length: 9cm  Needle Gauge: 21     Additional Needles:   Procedures:,,,, ultrasound used (permanent image in chart),,,,  Narrative:  Start time: 12/10/2020 12:35 PM End time: 12/10/2020 12:50 PM Injection made incrementally with aspirations every 5 mL.  Performed by: Personally  Anesthesiologist: Nolon Nations, MD  Additional Notes: BP cuff, EKG monitors applied. Sedation begun. Artery and nerve location verified with U/S and anesthetic injected incrementally, slowly, and after negative aspirations under direct u/s guidance. Good fascial /perineural spread. Tolerated well.

## 2020-12-10 NOTE — Anesthesia Procedure Notes (Signed)
Anesthesia Regional Block: Popliteal block   Pre-Anesthetic Checklist: ,, timeout performed, Correct Patient, Correct Site, Correct Laterality, Correct Procedure, Correct Position, site marked, Risks and benefits discussed,  Surgical consent,  Pre-op evaluation,  At surgeon's request and post-op pain management  Laterality: Right and Left  Prep: chloraprep       Needles:  Injection technique: Single-shot  Needle Type: Stimiplex     Needle Length: 10cm  Needle Gauge: 21     Additional Needles:   Procedures:,,,, ultrasound used (permanent image in chart),,,,  Motor weakness within 5 minutes.   Nerve Stimulator or Paresthesia:  Response: 0.5 mA,   Additional Responses:   Narrative:  Start time: 12/10/2020 12:50 PM End time: 12/10/2020 1:02 PM Injection made incrementally with aspirations every 5 mL.  Performed by: Personally  Anesthesiologist: Nolon Nations, MD  Additional Notes: Nerve located and needle positioned with direct ultrasound guidance. Good perineural spread. Patient tolerated well.

## 2020-12-10 NOTE — Consult Note (Signed)
Consult Note   Todd Mccoy [id number] DOB: [birth year] DOA: 12/10/2020  PCP: [name], MD Consultants:  Sharol Given - orthopedics; Carlean Purl - GI Patient coming from:  Home - lives alone; NOK: Mother, Todd Mccoy, [phone]   Chief Complaint: BKA  HPI: Todd Mccoy is a 50 y.o. male with medical history significant of HTN; stage 3a CKD; and DM presenting for BKA.  He was previously admitted from 5/11-14 for sepsis associated with L wound infection and underwent L 5th ray amputation.  Since discharge, he has had progressive gangrenous changes and so the plan was made for L transtibial amputation - which was performed today.  His prior concern at the time of my evaluation is periodic constipation resulting in GERD symptoms; he wants a good bowel regimen.  He reports reasonable glycemic control at home.   He is continuing to smoke cigarettes but declines patch, reporting that he knows he has to stop now.    ED Course:  Dr. Sharol Given performed BKA.  WBC 35.  He is receiving 1 unit PRBC, chronic anemia, no worse than prior.    Review of Systems: As per HPI; otherwise review of systems reviewed and negative.   COVID Vaccine Status:  None; First shot; Complete  Past Medical History:  Diagnosis Date  . Anemia   . Anxiety   . Diabetes mellitus without complication ([place])   . Diabetic neuropathy ([place])   . 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[place]) 03/25/2020   pt states retinal eye damage- left worse than the right- recent visited MD   . 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[place]) 03/25/2020   pt states recent MD visit /left eye worse than right  . Dyslipidemia 12/10/2020  . Hypertension   . Pneumonia   . Stage 3a chronic kidney disease ([place]) 12/10/2020    Past Surgical History:  Procedure Laterality Date  . ABSCESS DRAINAGE     neck  . AMPUTATION Left 11/14/2020   Procedure: LEFT 5TH RAY AMPUTATION;  Surgeon: Newt Minion, MD;  Location: [facility];  Service: Orthopedics;   Laterality: Left;  . AMPUTATION TOE Right 03/25/2020   Procedure: AMPUTATION TOE;  Surgeon: Evelina Bucy, DPM;  Location: [facility];  Service: Podiatry;  Laterality: Right;  . INCISION AND DRAINAGE ABSCESS Right 09/07/2014   Procedure: INCISION AND DRAINAGE ABSCESS RIGHT FLANK;  Surgeon: Jackolyn Confer, MD;  Location: [facility];  Service: General;  Laterality: Right;  . LEG AMPUTATION BELOW KNEE Left 12/10/2020  . SCROTUM EXPLORATION      Social History   Socioeconomic History  . Marital status: Single    Spouse name: Not on file  . Number of children: Not on file  . Years of education: Not on file  . Highest education level: Not on file  Occupational History  . Not on file  Tobacco Use  . Smoking status: Current Every Day Smoker    Packs/day: 1.00    Years: 32.00    Pack years: 32.00    Types: Cigarettes  . Smokeless tobacco: Never Used  . Tobacco comment: Currently down to 1/4 ppd  Substance and Sexual Activity  . Alcohol use: No  . Drug use: No  . Sexual activity: Not on file  Other Topics Concern  . Not on file  Social History Narrative  . Not on file   Social Determinants of Health   Financial Resource Strain: Not on file  Food Insecurity: Not on file  Transportation Needs: Not on file  Physical Activity: Not on file  Stress: Not on file  Social Connections: Not on file  Intimate Partner Violence: Not on file    Allergies  Allergen Reactions  . Amlodipine Swelling    BLE edema    Family History  Problem Relation Age of Onset  . Diabetes Mother   . Diabetes Father   . Diabetes Brother   . Colon cancer Neg Hx   . Esophageal cancer Neg Hx   . Pancreatic cancer Neg Hx   . Stomach cancer Neg Hx   . Liver disease Neg Hx     Prior to Admission medications   Medication Sig Start Date End Date Taking? Authorizing Provider  ascorbic acid (VITAMIN C) 1000 MG tablet Take 1 tablet (1,000 mg total) by mouth daily. 11/16/20  Yes Dana Allan I, MD   atorvastatin (LIPITOR) 20 MG tablet Take 1 tablet (20 mg total) by mouth daily. Patient taking differently: Take 20 mg by mouth in the morning. 10/22/20  Yes [name], MD  doxycycline (VIBRA-TABS) 100 MG tablet Take 1 tablet (100 mg total) by mouth 2 (two) times daily. 12/05/20  Yes Persons, Bevely Palmer, PA  gabapentin (NEURONTIN) 300 MG capsule Take 1 capsule (300 mg total) by mouth at bedtime. 10/22/20  Yes [name], Charlane Ferretti, MD  insulin glargine (LANTUS) 100 UNIT/ML Solostar Pen Inject 35 Units into the skin at bedtime. Patient taking differently: Inject 30 Units into the skin in the morning. 10/22/20 10/22/21 Yes [name], Charlane Ferretti, MD  losartan-hydrochlorothiazide (HYZAAR) 100-25 MG tablet TAKE 1 TABLET BY MOUTH DAILY. Patient taking differently: Take 1 tablet by mouth in the morning. 10/22/20 10/22/21 Yes [name], Charlane Ferretti, MD  metFORMIN (GLUCOPHAGE-XR) 500 MG 24 hr tablet TAKE 1 TABLET (500 MG TOTAL) BY MOUTH 2 (TWO) TIMES DAILY. 10/22/20 10/22/21 Yes [name], MD  nitroGLYCERIN (NITRODUR - DOSED IN MG/24 HR) 0.2 mg/hr patch Place 1 patch (0.2 mg total) onto the skin daily. 11/28/20  Yes Persons, Bevely Palmer, [place]  oxyCODONE (OXY IR/ROXICODONE) 5 MG immediate release tablet Take 1 tablet (5 mg total) by mouth every 4 (four) hours as needed for moderate pain, severe pain or breakthrough pain. 12/05/20  Yes Persons, Bevely Palmer, PA  pentoxifylline (TRENTAL) 400 MG CR tablet Take 1 tablet (400 mg total) by mouth 3 (three) times daily with meals. 11/28/20  Yes Persons, Bevely Palmer, PA  docusate sodium (COLACE) 100 MG capsule Take 1 capsule (100 mg total) by mouth daily. Patient not taking: Reported on 12/09/2020 11/16/20   Dana Allan I, MD  Insulin Pen Needle 32G X 4 MM MISC USE TO INJECT INSULIN DAILY. 05/13/20 05/13/21  [name], MD  Insulin Syringe-Needle U-100 (SAFETY INSULIN SYRINGES) 30G X 1/2" 1 ML MISC 35 Units by Does not apply route 2 (two) times daily before a meal. 04/17/20   McClung, Dionne Bucy, PA-C  pantoprazole (PROTONIX) 40 MG tablet Take 1 tablet (40 mg total) by mouth daily. Patient not taking: Reported on 12/09/2020 11/16/20   Dana Allan I, MD  zinc sulfate 220 (50 Zn) MG capsule Take 1 capsule (220 mg total) by mouth daily. Patient not taking: Reported on 12/09/2020 11/16/20 12/16/20  Dana Allan I, MD  insulin aspart protamine- aspart (NOVOLOG MIX 70/30) (70-30) 100 UNIT/ML injection Inject 0.35 mLs (35 Units total) into the skin 2 (two) times daily. 04/17/20 05/13/20  Argentina Donovan, PA-C  losartan (COZAAR) 50 MG tablet Take 1 tablet (50 mg total) by mouth daily. 05/13/20 05/23/20  [name], MD  Physical Exam: Vitals:   12/10/20 1730 12/10/20 1745 12/10/20 1800 12/10/20 1843  BP: 117/79 121/78 113/72 117/77  Pulse: 91 92 93 95  Resp: 18 (!) 21 (!) 22 20  Temp:   (!) 97.4 F (36.3 C) 97.6 F (36.4 C)  TempSrc:    Oral  SpO2: 100% 100% 100% 100%  Weight:      Height:         . General:  Appears calm and comfortable and is in NAD . Eyes:  EOMI, normal lids, iris . ENT:  grossly normal hearing, lips & tongue, mmm; appropriate dentition . Neck:  no LAD, masses or thyromegaly . Cardiovascular:  RRR, no m/r/g. No LE edema.  Marland Kitchen Respiratory:   CTA bilaterally with no wheezes/rales/rhonchi.  Normal respiratory effort. . Abdomen:  soft, NT, ND . Skin:  no rash or induration seen on limited exam . Musculoskeletal:  S/p L BKA with wound vac in place; R foot is s/p 2nd toe amputation . Psychiatric:  blunted mood and affect, speech fluent and appropriate, AOx3 . Neurologic:  CN 2-12 grossly intact, moves all extremities in coordinated fashion    Radiological Exams on Admission: Independently reviewed - see discussion in A/P where applicable  DG CHEST PORT 1 VIEW  Result Date: 12/10/2020 CLINICAL DATA:  Shortness of breath.  Preoperative study. EXAM: PORTABLE CHEST 1 VIEW COMPARISON:  Chest x-ray 09/06/2014. FINDINGS: Mediastinum and hilar structures  normal. Cardiomegaly. Mild pulmonary venous congestion. Right base infiltrate/edema. Small right pleural effusion. IMPRESSION: 1.  Cardiomegaly.  Mild pulmonary venous congestion. 2.  Right base infiltrate/edema.  Small right pleural effusion. These results will be called to the ordering clinician or representative by the Radiologist Assistant, and communication documented in the PACS or [employer]. Electronically Signed   By: Marcello Moores  Register   On: 12/10/2020 12:01    EKG: Independently reviewed.  Sinus tachycardia with rate 111; incomplete RBBB, nonspecific ST changes with NSCSLT   Labs on Admission: I have personally reviewed the available labs and imaging studies at the time of the admission.  Pertinent labs:   WBC 35.8 Hgb 7.8 - stable Platelets 608 Glucose 265, 284, 279, 284, 213   Assessment/Plan Principal Problem:   Subacute osteomyelitis, left ankle and foot (HCC) Active Problems:   DM2 (diabetes mellitus, type 2) (HCC)   Tobacco abuse   HTN (hypertension)   S/P BKA (below knee amputation) unilateral, left (HCC)   Dyslipidemia   Stage 3a chronic kidney disease (HCC)   Osteomyelitis, s/p L BKA -Patient with prior ray amputation presenting with refractory healing and worsening infection -WBC on admission was markedly elevated as well platelets -No other source of infection identified, likely associated with foot infection -Will order blood cultures -Repeat CBC in AM -For now will initiate care of severe wound infection with Rocephin and Flagyl (no recent h/o MRSA)  HTN -Hold Hyzaar for now and consider resumption after AM BMP  HLD -Continue Lipitor  DM -A1c on 5/11 was 8.6, indicating poor control -Continue Lantus -Hold Metformin -Cover with moderate-scale SSI -Continue Neurontin  Stage 3a CKD -Creatinine 1.54/GFR 55 on 5/13 -No BMP performed today -Will check BMP in AM and follow  Tobacco dependence -Encourage cessation.   -This was discussed with  the patient and should be reviewed on an ongoing basis.   -Patch declined by patient.    Note: This patient has been tested and is negative for the novel coronavirus COVID-19.  Thank you for this interesting consult.  [facility]  will continue to follow the patient with you at this time.    Karmen Bongo MD [facility]   How to contact the [facility] Attending or Consulting provider [place] or covering provider during after hours [place], for this patient?  1. Check the care team in [facility] and look for a) attending/consulting [facility] provider listed and b) the [facility] team listed 2. Log into [email or web address] and use [place]'s universal password to access. If you do not have the password, please contact the hospital operator. 3. Locate the [facility] provider you are looking for under [facility] and page to a number that you can be directly reached. 4. If you still have difficulty reaching the provider, please page the [facility] (Director on Call) for the Hospitalists listed on amion for assistance.   12/10/2020, 6:59 PM

## 2020-12-10 NOTE — Op Note (Signed)
   Date of Surgery: 12/10/2020  INDICATIONS: Todd Mccoy is a 50 y.o.-year-old male who presents with abscess, osteomyelitis left foot.  PREOPERATIVE DIAGNOSIS: abscess, osteomyelitis left foot  POSTOPERATIVE DIAGNOSIS: Same.  PROCEDURE: Transtibial amputation Application of Prevena wound VAC  SURGEON: Sharol Given, M.D.  ANESTHESIA:  regional  IV FLUIDS AND URINE: See anesthesia records.  ESTIMATED BLOOD LOSS: See anesthesia records.  COMPLICATIONS: None.  DESCRIPTION OF PROCEDURE: The patient was brought to the operating room after undergoing regional anesthetic. After adequate levels of anesthesia were obtained patient's lower extremity was prepped using DuraPrep draped into a sterile field. A timeout was called. The foot was draped out of the sterile field with impervious stockinette. A transverse incision was made 11 cm distal to the tibial tubercle. This curved proximally and a large posterior flap was created. The tibia was transected 1 cm proximal to the skin incision. The fibula was transected just proximal to the tibial incision. The tibia was beveled anteriorly. A large posterior flap was created. The sciatic nerve was pulled cut and allowed to retract. The vascular bundles were suture ligated with 2-0 silk. The deep and superficial fascial layers were closed using #1 Vicryl. The skin was closed using staples and 2-0 nylon. The wound was covered with a Prevena customizable and arthroform wound VAC.  The dressing was sealed with dermatac there was a good suction fit. A prosthetic shrinker will be applied in patient's room. Patient was taken to the PACU in stable condition.   DISCHARGE PLANNING:  Antibiotic duration: 24 hours  Weightbearing: Nonweightbearing on the operative extremity  Pain medication: Opioid pathway  Dressing care/ Wound VAC: Continue wound VAC for 1 week after discharge  Discharge to: Discharge planning based on therapy's recommendations for possible inpatient  rehabilitation, outpatient rehabilitation, or discharge to home with therapy  Follow-up: In the office 1 week post operative.  Meridee Score, MD [place] 2:02 PM

## 2020-12-10 NOTE — Interval H&P Note (Signed)
History and Physical Interval Note:  12/10/2020 12:24 PM  Todd Mccoy  has presented today for surgery, with the diagnosis of Abscess, Osteomyelitis Left Foot.  The various methods of treatment have been discussed with the patient and family. After consideration of risks, benefits and other options for treatment, the patient has consented to  Procedure(s): LEFT BELOW KNEE AMPUTATION (Left) as a surgical intervention.  The patient's history has been reviewed, patient examined, no change in status, stable for surgery.  I have reviewed the patient's chart and labs.  Questions were answered to the patient's satisfaction.     Newt Minion

## 2020-12-10 NOTE — H&P (Signed)
Todd Mccoy is an 50 y.o. male.   Chief Complaint: left foot osteomyelitis HPI: Patient is a 50 year old gentleman who presents for 2 separate.  #1 he is status post left fifth ray amputation with progressive dehiscence despite using Trental and nitroglycerin patch.  Patient has new ulceration medially to the left foot.  Patient has also had a left tibial plateau fracture which is greater than 4 months old patient was initially undergoing conservative therapy at [facility] and has had a Bledsoe brace.  Patient still smokes daily.  He states his blood sugars have been doing well and is on doxycycline.  Past Medical History:  Diagnosis Date  . Anxiety   . Diabetes mellitus without complication ([place])   . Diabetic neuropathy ([place])   . 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[place]) 03/25/2020   pt states retinal eye damage- left worse than the right- recent visited MD   . 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[place]) 03/25/2020   pt states recent MD visit /left eye worse than right  . Hypertension   . Pneumonia     Past Surgical History:  Procedure Laterality Date  . ABSCESS DRAINAGE     neck  . AMPUTATION Left 11/14/2020   Procedure: LEFT 5TH RAY AMPUTATION;  Surgeon: Newt Minion, MD;  Location: [facility];  Service: Orthopedics;  Laterality: Left;  . AMPUTATION TOE Right 03/25/2020   Procedure: AMPUTATION TOE;  Surgeon: Evelina Bucy, DPM;  Location: [facility];  Service: Podiatry;  Laterality: Right;  . INCISION AND DRAINAGE ABSCESS Right 09/07/2014   Procedure: INCISION AND DRAINAGE ABSCESS RIGHT FLANK;  Surgeon: Jackolyn Confer, MD;  Location: [facility];  Service: General;  Laterality: Right;  . SCROTUM EXPLORATION      Family History  Problem Relation Age of Onset  . Diabetes Mother   . Diabetes Father   . Diabetes Brother   . Colon cancer Neg Hx   . Esophageal cancer Neg Hx   . Pancreatic cancer Neg Hx   . Stomach cancer Neg Hx   . Liver disease Neg Hx    Social  History:  reports that he has been smoking cigarettes. He has been smoking about 0.25 packs per day. He has never used smokeless tobacco. He reports that he does not drink alcohol and does not use drugs.  Allergies:  Allergies  Allergen Reactions  . Amlodipine Swelling    BLE edema    No medications prior to admission.    No results found for this or any previous visit (from the past 48 hour(s)). XR Knee 1-2 Views Left  Result Date: 12/08/2020 2 view radiographs of the left knee shows progressive collapse of the medial tibial plateau fracture.   Review of Systems  All other systems reviewed and are negative.   There were no vitals taken for this visit. Physical Exam  Patient is alert, oriented, no adenopathy, well-dressed, normal affect, normal respiratory effort. Examination patient has progressive gangrenous changes of the lateral aspect the left foot with new ischemic ulcerative changes of the medial aspect the left foot.  The Doppler was used and he does have a strong biphasic dorsalis pedis pulse.  Patient does not have sufficient soft tissue to consider foot salvage intervention options.  Patient does have varus alignment of the left knee but this does correct passively and anticipate with a socket this should provide varus and valgus stress for to the knee.Heart RRR Lungs clear Assessment/Plan 1. Chronic pain of left  knee   2. Cutaneous abscess of left foot   3. Closed fracture of medial portion of left tibial plateau, sequela     Plan: With the progressive gangrenous changes abscess and osteomyelitis of the left foot patient does not have further foot salvage intervention options.  We will plan for a left transtibial amputation, with the prosthetic socket I feel this should support his knee and allow him to ambulate with his native knee despite the medial tibial plateau fracture.  Patient currently lives with his son and niece and has a good supportive care system at  home.    Bevely Palmer Blase Beckner, PA 12/10/2020, 6:36 AM

## 2020-12-10 NOTE — Progress Notes (Signed)
DR. Lissa Hoard notified of CXR result at 1206

## 2020-12-11 ENCOUNTER — Inpatient Hospital Stay (HOSPITAL_COMMUNITY): Payer: Medicaid Other

## 2020-12-11 ENCOUNTER — Encounter (HOSPITAL_COMMUNITY): Payer: Self-pay | Admitting: Orthopedic Surgery

## 2020-12-11 ENCOUNTER — Other Ambulatory Visit (HOSPITAL_COMMUNITY): Payer: Self-pay

## 2020-12-11 LAB — GLUCOSE, CAPILLARY
Glucose-Capillary: 283 mg/dL — ABNORMAL HIGH (ref 70–99)
Glucose-Capillary: 284 mg/dL — ABNORMAL HIGH (ref 70–99)
Glucose-Capillary: 161 mg/dL — ABNORMAL HIGH (ref 70–99)
Glucose-Capillary: 63 mg/dL — ABNORMAL LOW (ref 70–99)
Glucose-Capillary: 65 mg/dL — ABNORMAL LOW (ref 70–99)
Glucose-Capillary: 78 mg/dL (ref 70–99)

## 2020-12-11 LAB — CBC
HCT: 25.9 % — ABNORMAL LOW (ref 39.0–52.0)
Hemoglobin: 8.2 g/dL — ABNORMAL LOW (ref 13.0–17.0)
MCH: 24.7 pg — ABNORMAL LOW (ref 26.0–34.0)
MCHC: 31.7 g/dL (ref 30.0–36.0)
MCV: 78 fL — ABNORMAL LOW (ref 80.0–100.0)
Platelets: 528 10*3/uL — ABNORMAL HIGH (ref 150–400)
RBC: 3.32 MIL/uL — ABNORMAL LOW (ref 4.22–5.81)
RDW: 19.3 % — ABNORMAL HIGH (ref 11.5–15.5)
WBC: 22.2 10*3/uL — ABNORMAL HIGH (ref 4.0–10.5)
nRBC: 0 % (ref 0.0–0.2)

## 2020-12-11 LAB — SODIUM, URINE, RANDOM: Sodium, Ur: <10 mmol/L

## 2020-12-11 LAB — BASIC METABOLIC PANEL
Anion gap: 12 (ref 5–15)
BUN: 51 mg/dL — ABNORMAL HIGH (ref 6–20)
CO2: 26 mmol/L (ref 22–32)
Calcium: 7.9 mg/dL — ABNORMAL LOW (ref 8.9–10.3)
Chloride: 95 mmol/L — ABNORMAL LOW (ref 98–111)
Creatinine, Ser: 3.25 mg/dL — ABNORMAL HIGH (ref 0.61–1.24)
GFR, Estimated: 23 mL/min — ABNORMAL LOW (ref >60)
Glucose, Bld: 268 mg/dL — ABNORMAL HIGH (ref 70–99)
Potassium: 3.6 mmol/L (ref 3.5–5.1)
Sodium: 133 mmol/L — ABNORMAL LOW (ref 135–145)

## 2020-12-11 LAB — URINALYSIS, ROUTINE W REFLEX MICROSCOPIC
Bacteria, UA: NONE SEEN
Bilirubin Urine: NEGATIVE
Glucose, UA: 50 mg/dL — AB
Ketones, ur: 5 mg/dL — AB
Nitrite: NEGATIVE
Protein, ur: 100 mg/dL — AB
RBC / HPF: >50 RBC/hpf — ABNORMAL HIGH (ref 0–5)
Specific Gravity, Urine: 1.024 (ref 1.005–1.030)
pH: 5 (ref 5.0–8.0)

## 2020-12-11 LAB — MAGNESIUM: Magnesium: 1.6 mg/dL — ABNORMAL LOW (ref 1.7–2.4)

## 2020-12-11 LAB — PROCALCITONIN: Procalcitonin: 0.83 ng/mL

## 2020-12-11 LAB — BRAIN NATRIURETIC PEPTIDE: B Natriuretic Peptide: 1024 pg/mL — ABNORMAL HIGH (ref 0.0–100.0)

## 2020-12-11 LAB — CREATININE, URINE, RANDOM: Creatinine, Urine: 308.73 mg/dL

## 2020-12-11 MED ORDER — INSULIN GLARGINE 100 UNIT/ML ~~LOC~~ SOLN
15.0000 [IU] | Freq: Every day | SUBCUTANEOUS | Status: DC
  Administered 2020-12-12 – 2020-12-13 (×2): 15 [IU] via SUBCUTANEOUS
  Filled 2020-12-11 (×3): qty 0.15

## 2020-12-11 MED ORDER — LACTATED RINGERS IV BOLUS
500.0000 mL | Freq: Once | INTRAVENOUS | Status: AC
  Administered 2020-12-11: 500 mL via INTRAVENOUS

## 2020-12-11 MED ORDER — INSULIN ASPART 100 UNIT/ML IJ SOLN
2.0000 [IU] | Freq: Three times a day (TID) | INTRAMUSCULAR | Status: DC
  Administered 2020-12-11: 2 [IU] via SUBCUTANEOUS

## 2020-12-11 MED ORDER — ENSURE MAX PROTEIN PO LIQD
11.0000 [oz_av] | Freq: Every day | ORAL | Status: DC
  Administered 2020-12-11 – 2020-12-21 (×10): 11 [oz_av] via ORAL
  Filled 2020-12-11 (×14): qty 330

## 2020-12-11 MED ORDER — INSULIN ASPART 100 UNIT/ML IJ SOLN
0.0000 [IU] | Freq: Every day | INTRAMUSCULAR | Status: DC
Start: 2020-12-11 — End: 2020-12-11

## 2020-12-11 MED ORDER — INSULIN GLARGINE 100 UNIT/ML ~~LOC~~ SOLN
35.0000 [IU] | Freq: Every day | SUBCUTANEOUS | Status: DC
  Filled 2020-12-11: qty 0.35

## 2020-12-11 MED ORDER — INSULIN ASPART 100 UNIT/ML IJ SOLN
0.0000 [IU] | Freq: Three times a day (TID) | INTRAMUSCULAR | Status: DC
  Administered 2020-12-11: 2 [IU] via SUBCUTANEOUS
  Administered 2020-12-11: 5 [IU] via SUBCUTANEOUS

## 2020-12-11 MED ORDER — LACTATED RINGERS IV SOLN: INTRAVENOUS | Status: DC

## 2020-12-11 MED ORDER — HEPARIN SODIUM (PORCINE) 5000 UNIT/ML IJ SOLN
5000.0000 [IU] | Freq: Three times a day (TID) | INTRAMUSCULAR | Status: DC
  Administered 2020-12-11 – 2020-12-19 (×23): 5000 [IU] via SUBCUTANEOUS
  Filled 2020-12-11 (×23): qty 1

## 2020-12-11 MED ORDER — INSULIN GLARGINE 100 UNIT/ML ~~LOC~~ SOLN
10.0000 [IU] | Freq: Once | SUBCUTANEOUS | Status: AC
  Administered 2020-12-11: 10 [IU] via SUBCUTANEOUS
  Filled 2020-12-11: qty 0.1

## 2020-12-11 MED ORDER — TAMSULOSIN HCL 0.4 MG PO CAPS
0.4000 mg | ORAL_CAPSULE | Freq: Every day | ORAL | Status: DC
  Administered 2020-12-11 – 2020-12-22 (×12): 0.4 mg via ORAL
  Filled 2020-12-11 (×12): qty 1

## 2020-12-11 MED ORDER — ADULT MULTIVITAMIN W/MINERALS CH
1.0000 | ORAL_TABLET | Freq: Every day | ORAL | Status: DC
  Administered 2020-12-11 – 2020-12-22 (×12): 1 via ORAL
  Filled 2020-12-11 (×12): qty 1

## 2020-12-11 MED ORDER — POLYETHYLENE GLYCOL 3350 17 G PO PACK
17.0000 g | PACK | Freq: Two times a day (BID) | ORAL | Status: DC
  Administered 2020-12-11 – 2020-12-15 (×8): 17 g via ORAL
  Filled 2020-12-11 (×7): qty 1

## 2020-12-11 MED ORDER — DOCUSATE SODIUM 100 MG PO CAPS
200.0000 mg | ORAL_CAPSULE | Freq: Two times a day (BID) | ORAL | Status: DC
  Administered 2020-12-11 – 2020-12-22 (×21): 200 mg via ORAL
  Filled 2020-12-11 (×21): qty 2

## 2020-12-11 MED ORDER — MAGNESIUM SULFATE 2 GM/50ML IV SOLN
2.0000 g | Freq: Once | INTRAVENOUS | Status: AC
  Administered 2020-12-11: 2 g via INTRAVENOUS
  Filled 2020-12-11: qty 50

## 2020-12-11 MED ORDER — MAGNESIUM HYDROXIDE 400 MG/5ML PO SUSP
30.0000 mL | Freq: Two times a day (BID) | ORAL | Status: AC
  Administered 2020-12-12: 30 mL via ORAL
  Filled 2020-12-11: qty 30

## 2020-12-11 MED ORDER — INSULIN ASPART 100 UNIT/ML IJ SOLN: 0.0000 [IU] | INTRAMUSCULAR | Status: DC

## 2020-12-11 MED ORDER — INSULIN ASPART 100 UNIT/ML IJ SOLN
0.0000 [IU] | Freq: Three times a day (TID) | INTRAMUSCULAR | Status: DC
Start: 2020-12-11 — End: 2020-12-11

## 2020-12-11 NOTE — Progress Notes (Signed)
Patient ID: Todd Mccoy, male   DOB: [birth year], 50 y.o.   MRN: [id number] Patient is postoperative day 1 left transtibial amputation.  The wound VAC did not have a good seal this was checked along the derma tack border and the seal was restored.  Patient's hemoglobin is 8.2 prealbumin is 5.5 nutrition consult was ordered.  Discharge planning based on recommendations from therapy.

## 2020-12-11 NOTE — Evaluation (Signed)
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 Pt now s/p L transtibial amputation. He endorses phantom limb sensation, educated pt on desensitization strategies. Pt currently requires minA for squat-pivot transfer from EOB to drop arm recliner. Pt with significant visual limitations at baseline, pt reports peripheral vision is intact. Due to visual limitations, pt requires cues for hand placement with progression of transfer and for locating items. Due to decline in current level of function, pt would benefit from acute OT to address established goals to facilitate safe D/C to venue listed below. At this time, recommend CIR follow-up. Will continue to follow acutely.     Follow Up Recommendations  CIR;Supervision/Assistance - 24 hour    Equipment Recommendations  3 in 1 bedside commode;Other (comment) (drop arm)    Recommendations for Other Services       Precautions / Restrictions Precautions Precautions: Fall Restrictions Weight Bearing Restrictions: Yes LLE Weight Bearing: Non weight bearing      Mobility Bed Mobility Overal bed mobility: Needs Assistance Bed Mobility: Supine to Sit     Supine to sit: Min guard     General bed mobility comments: cued for direction and sequencing.  no assist needed, pt bridged to EOB and came up/forward and scooted to EOB well, but tentatively due to anxious about new task.    Transfers Overall transfer level: Needs assistance    Transfers: Squat Pivot Transfers     Squat pivot transfers: Min assist     General transfer comment: cues for hand placement and general technique.  minimal assist for boost/stability with squat pivot, pt using UE's appropriately and bearing weight adequately on the R LE.    Balance Overall balance assessment: Needs assistance Sitting-balance support: No upper extremity supported;Feet supported Sitting balance-Leahy Scale: Fair                                     ADL either performed or assessed with clinical judgement   ADL Overall ADL's : Needs assistance/impaired Eating/Feeding: Set up;Sitting   Grooming: Set up;Sitting   Upper Body Bathing: Set up;Sitting   Lower Body Bathing: Set up;Sitting/lateral leans   Upper Body Dressing : Set up;Sitting   Lower Body Dressing: Set up;Sitting/lateral leans   Toilet Transfer: Minimal assistance;Squat-pivot Toilet Transfer Details (indicate cue type and reason): simulated to drop arm recliner         Functional mobility during ADLs: Minimal assistance General ADL Comments: pt limited by visual limitations, NWB LLE, decrease activity tolerance     Vision         Perception     Praxis      Pertinent Vitals/Pain Pain Assessment: Faces Faces Pain Scale: Hurts a little bit Pain Location: L LE Pain Descriptors / Indicators: Discomfort;Other (Comment) (more  sensory than painful--phantom sensation) Pain Intervention(s): Limited activity within patient's tolerance;Monitored during session     Hand Dominance Right  Extremity/Trunk Assessment Upper Extremity Assessment Upper Extremity Assessment: Overall WFL for tasks assessed   Lower Extremity Assessment Lower Extremity Assessment: Defer to PT evaluation RLE Deficits / Details: general weakness, but moves well against gravity, assists well during transfer. RLE Sensation: history of peripheral neuropathy RLE Coordination: decreased fine motor LLE  Deficits / Details: moves against gravity LLE Sensation: history of peripheral neuropathy LLE Coordination: decreased fine motor   Cervical / Trunk Assessment Cervical / Trunk Assessment: Normal   Communication Communication Communication: No difficulties   Cognition Arousal/Alertness: Awake/alert Behavior During Therapy: WFL for tasks assessed/performed Overall Cognitive Status: Within Functional Limits for tasks assessed                                 General Comments: for basic mobility and ADL, educated pt on desensitization strategies for phantom limb pain/sensation   General Comments  Initiated discussion of phatom sensation/pain, general progression toward getting a prosthesis.    Exercises     Shoulder Instructions      Home Living Family/patient expects to be discharged to:: Private residence Living Arrangements: Children;Other relatives Available Help at Discharge: Available 24 hours/day Type of Home: House Home Access: Level entry     Home Layout: Multi-level;Able to live on main level with bedroom/bathroom     Bathroom Shower/Tub: Teacher, early years/pre: Standard Bathroom Accessibility: Yes   Home Equipment: Crutches;Walker - 4 wheels;Cane - single point;Walker - 2 wheels;Tub bench   Additional Comments: lives with son, niece, Mom who is retired.      Prior Functioning/Environment Level of Independence: Independent with assistive device(s)        Comments: using RW prn, otherwise independent, visual impairments at baseline has peripheral vision        OT Problem List: Decreased activity tolerance;Impaired balance (sitting and/or standing);Decreased safety awareness;Decreased knowledge of precautions;Impaired sensation;Pain      OT Treatment/Interventions: Self-care/ADL training;Therapeutic exercise;DME and/or AE instruction;Therapeutic activities;Patient/family education;Balance training    OT Goals(Current goals can be  found in the care plan section) Acute Rehab OT Goals Patient Stated Goal: get a prosthetic OT Goal Formulation: With patient Time For Goal Achievement: 12/25/20 Potential to Achieve Goals: Good ADL Goals Pt Will Perform Lower Body Dressing: with modified independence;sit to/from stand Pt Will Transfer to Toilet: with modified independence;stand pivot transfer Additional ADL Goal #1: Pt will demonstrate independence with desensitization strategies for phantom limb pain/sensation.  OT Frequency: Min 2X/week   Barriers to D/C:            Co-evaluation              AM-PAC OT "6 Clicks" Daily Activity     Outcome Measure Help from another person eating meals?: A Little Help from another person taking care of personal grooming?: A Little Help from another person toileting, which includes using toliet, bedpan, or urinal?: A Little Help from another person bathing (including washing, rinsing, drying)?: A Little Help from another person to put on and taking off regular upper body clothing?: None Help from another person to put on and taking off regular lower body clothing?: A Little 6 Click Score: 19   End of Session Equipment Utilized During Treatment: Other (comment) (limb guard) Nurse Communication: Mobility status  Activity Tolerance: Patient tolerated treatment well Patient left: in chair;with call bell/phone within reach;with chair alarm set  OT Visit Diagnosis: Unsteadiness on feet (R26.81);Other abnormalities of gait and mobility (R26.89);Low  vision, both eyes (H54.2);Pain Pain - Right/Left: Left Pain - part of body: Leg                Time: [id number] OT Time Calculation (min): 20 min Charges:  OT General Charges $OT Visit: 1 Visit OT Evaluation $OT Eval Moderate Complexity: [place] OTR/L Acute Rehabilitation Services Office: [place] 12/11/2020, 2:04 PM

## 2020-12-11 NOTE — Anesthesia Postprocedure Evaluation (Signed)
Anesthesia Post Note  Patient: Todd Mccoy  Procedure(s) Performed: LEFT BELOW KNEE AMPUTATION (Left: Knee)     Patient location during evaluation: PACU Anesthesia Type: Regional Level of consciousness: awake and alert Pain management: pain level controlled Vital Signs Assessment: post-procedure vital signs reviewed and stable Respiratory status: spontaneous breathing Cardiovascular status: stable Anesthetic complications: no   No notable events documented.  Last Vitals:  Vitals:   12/11/20 0352 12/11/20 0735  BP: 120/78 136/86  Pulse: 91 97  Resp: 19 20  Temp: 36.7 C 36.9 C  SpO2: 99% 98%    Last Pain:  Vitals:   12/11/20 0735  TempSrc: Oral  PainSc: 0-No pain                 Nolon Nations

## 2020-12-11 NOTE — Progress Notes (Signed)
Patient bladder scanned and it showed 23m. Will rescan the patient at 1pm. He still has no urge to urinate and his bladder does not feel full.

## 2020-12-11 NOTE — Progress Notes (Signed)
PROGRESS NOTE                                                                                                                                                                                                             Patient Demographics:    Todd Mccoy, is a 50 y.o. male, DOB - 05/04/1971, [id number]  Outpatient Primary MD for the patient is [name], MD    LOS - 1  Admit date - 12/10/2020    CC - L.Foot infection     Brief Narrative (HPI from H&P) Todd Mccoy is a 50 y.o. 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   Subjective:    Todd Mccoy today has, No headache, No chest pain, No abdominal pain - No Nausea, No new weakness tingling or numbness, No SOB   Assessment  & Plan :     Recurrent left foot infection.  S/p BKA.  Defer management to primary team orthopedics, stump site under bandage.  Will stop antibiotics after today.  Case discussed with orthopedics.  2.  AKI on CKD stage IIIa.  Baseline creatinine around 1.7, he appears severely dehydrated, hydrate with IV fluids, avoid nephrotoxins, check renal ultrasound and urine electrolytes.  3.  Dyslipidemia.  On statin continue.  4.  Tobacco abuse.  Counseled to quit.  5.  Perioperative blood loss related anemia.  Received 1 unit of packed RBC earlier this admission, currently stable H&H.  6.  DM type II.  Currently on SSI, increase Lantus dose.  Lab Results  Component Value Date   HGBA1C 8.6 (H) 11/12/2020    CBG (last 3)  Recent Labs    12/10/20 1845 12/10/20 2013 12/11/20 0732  GLUCAP 218* 210* 283*         Condition - Fair  Family Communication  : Per Primary team - Ortho  Code Status :   Full  Consults  :  TRH consulting  PUD Prophylaxis :    Procedures  :     L BKA  Renal US -       Disposition Plan  :    Status  is: Inpatient  Remains inpatient appropriate because:IV treatments appropriate due to intensity of illness or inability to take PO  Dispo: The patient is from: Home              Anticipated d/c is to: SNF              Patient currently is not medically stable to d/c.   Difficult to place patient No   DVT Prophylaxis  : Heparin added 12/11/2020.  SCD's Start: 12/10/20 1712   Lab Results  Component Value Date   PLT 528 (H) 12/11/2020    Diet :  Diet Order             Diet Carb Modified Fluid consistency: Thin; Room service appropriate? Yes  Diet effective now                    Inpatient Medications  Scheduled Meds:  vitamin C  1,000 mg Oral Daily   atorvastatin  20 mg Oral Daily   docusate sodium  100 mg Oral BID   gabapentin  300 mg Oral QHS   insulin aspart  0-15 Units Subcutaneous TID WC   insulin glargine  25 Units Subcutaneous Daily   nutrition supplement (JUVEN)  1 packet Oral BID BM   pantoprazole  40 mg Oral Daily   zinc sulfate  220 mg Oral Daily   Continuous Infusions:  cefTRIAXone (ROCEPHIN)  IV 2 g (12/10/20 1956)   And   metronidazole 500 mg (12/11/20 0405)   lactated ringers     lactated ringers     magnesium sulfate bolus IVPB     PRN Meds:.acetaminophen, alum & mag hydroxide-simeth, bisacodyl, guaiFENesin-dextromethorphan, hydrALAZINE, HYDROmorphone (DILAUDID) injection, magnesium citrate, ondansetron, oxyCODONE, phenol, polyethylene glycol  Antibiotics  :    Anti-infectives (From admission, onward)    Start     Dose/Rate Route Frequency Ordered Stop   12/10/20 2000  cefTRIAXone (ROCEPHIN) 2 g in sodium chloride 0.9 % 100 mL IVPB       See Hyperspace for full Linked Orders Report.   2 g 200 mL/hr over 30 Minutes Intravenous Every 24 hours 12/10/20 1853 12/11/20 2359   12/10/20 2000  metroNIDAZOLE  (FLAGYL) IVPB 500 mg       See Hyperspace for full Linked Orders Report.   500 mg 100 mL/hr over 60 Minutes Intravenous Every 8 hours 12/10/20 1853 12/11/20 2359   12/10/20 1715  ceFAZolin (ANCEF) IVPB 2g/100 mL premix  Status:  Discontinued        2 g 200 mL/hr over 30 Minutes Intravenous Every 8 hours 12/10/20 1711 12/10/20 1853   12/10/20 1245  vancomycin (VANCOREADY) IVPB 1000 mg/200 mL        1,000 mg 200 mL/hr over 60 Minutes Intravenous  Once 12/10/20 1241 12/10/20 1350   12/10/20 1135  vancomycin (VANCOCIN) 1-5 GM/200ML-% IVPB       Note to Pharmacy: Renda Rolls   : cabinet override      12/10/20 1135 12/10/20 1247   12/10/20 1130  ceFAZolin (ANCEF) IVPB 2g/100 mL premix        2 g 200 mL/hr over 30 Minutes Intravenous On call to O.R. 12/10/20 1122 12/10/20 1322        Time Spent in minutes  30   Lala Lund M.D on 12/11/2020 at 11:45 AM  To page go to [email or web address]   [facility] -  Office  [phone]   See all Orders from today for further details  Objective:   Vitals:   12/10/20 2014 12/10/20 2318 12/11/20 0352 12/11/20 0735  BP: 131/83 127/78 120/78 136/86  Pulse: 90 90 91 97  Resp: '20 18 19 20  '$ Temp: 97.8 F (36.6 C) 97.8 F (36.6 C) 98 F (36.7 C) 98.4 F (36.9 C)  TempSrc: Oral Axillary Oral Oral  SpO2: 100% 99% 99% 98%  Weight:      Height:        Wt Readings from Last 3 Encounters:  12/10/20 79.4 kg  11/12/20 89.8 kg  10/22/20 89.8 kg     Intake/Output Summary (Last 24 hours) at 12/11/2020 1146 Last data filed at 12/11/2020 0745 Gross per 24 hour  Intake 1508.65 ml  Output 150 ml  Net 1358.65 ml     Physical Exam  Awake Alert, No new F.N deficits, Normal affect [place].AT,PERRAL Supple Neck,No JVD, No cervical lymphadenopathy appriciated.  Symmetrical Chest wall movement, Good air movement bilaterally, CTAB RRR,No Gallops,Rubs or new Murmurs, No Parasternal Heave +ve B.Sounds, Abd Soft, No tenderness, No organomegaly  appriciated, No rebound - guarding or rigidity. No Cyanosis, L. BKA    Data Review:    CBC Recent Labs  Lab 12/10/20 1125 12/11/20 0236  WBC 35.8* 22.2*  HGB 7.8* 8.2*  HCT 26.2* 25.9*  PLT 608* 528*  MCV 80.6 78.0*  MCH 24.0* 24.7*  MCHC 29.8* 31.7  RDW 20.3* 19.3*    Recent Labs  Lab 12/10/20 1901 12/11/20 0236 12/11/20 0721  NA  --  133*  --   K  --  3.6  --   CL  --  95*  --   CO2  --  26  --   GLUCOSE  --  268*  --   BUN  --  51*  --   CREATININE  --  3.25*  --   CALCIUM  --  7.9*  --   MG  --   --  1.6*  CRP 29.8*  --   --   PROCALCITON  --   --  0.83  BNP  --   --  1,024.0*    ------------------------------------------------------------------------------------------------------------------ No results for input(s): CHOL, HDL, LDLCALC, TRIG, CHOLHDL, LDLDIRECT in the last 72 hours.  Lab Results  Component Value Date   HGBA1C 8.6 (H) 11/12/2020   ------------------------------------------------------------------------------------------------------------------ No results for input(s): TSH, T4TOTAL, T3FREE, THYROIDAB in the last 72 hours.  Invalid input(s): FREET3  Cardiac Enzymes No results for input(s): CKMB, TROPONINI, MYOGLOBIN in the last 168 hours.  Invalid input(s): CK ------------------------------------------------------------------------------------------------------------------    Component Value Date/Time   BNP 1,024.0 (H) 12/11/2020 [id number]    Micro Results Recent Results (from the past 240 hour(s))  SARS Coronavirus 2 by RT PCR (hospital order, performed in [facility] hospital lab) Nasopharyngeal Nasopharyngeal Swab     Status: None   Collection Time: 12/10/20 11:25 AM   Specimen: Nasopharyngeal Swab  Result Value Ref Range Status   SARS Coronavirus 2 NEGATIVE NEGATIVE Final    Comment: Performed at [facility], [place] [address], [place], [place] [address]  Culture, blood (routine x 2)     Status: None (Preliminary result)    Collection Time: 12/10/20  7:01 PM   Specimen: BLOOD  Result Value Ref Range Status   Specimen Description BLOOD SITE NOT SPECIFIED  Final   Special Requests   Final    BOTTLES DRAWN AEROBIC AND ANAEROBIC Blood Culture adequate volume   Culture   Final    NO GROWTH < 12 HOURS  Performed at [facility], [place] [address], [place], [place] [address]    Report Status PENDING  Incomplete  Culture, blood (routine x 2)     Status: None (Preliminary result)   Collection Time: 12/10/20  7:01 PM   Specimen: BLOOD  Result Value Ref Range Status   Specimen Description BLOOD SITE NOT SPECIFIED  Final   Special Requests   Final    BOTTLES DRAWN AEROBIC ONLY Blood Culture results may not be optimal due to an inadequate volume of blood received in culture bottles   Culture   Final    NO GROWTH < 12 HOURS Performed at [facility], [place] [address] [place], [place] [address]    Report Status PENDING  Incomplete    Radiology Reports DG Chest Port 1 View  Result Date: 12/11/2020 CLINICAL DATA:  Shortness of breath. EXAM: PORTABLE CHEST 1 VIEW COMPARISON:  12/10/2020. FINDINGS: Cardiomegaly with mild pulmonary venous congestion. Low lung volumes with bibasilar atelectasis. Bibasilar infiltrates/edema cannot be excluded. Small right pleural effusion. No pneumothorax. IMPRESSION: Cardiomegaly with mild pulmonary venous congestion. 2. Low lung volumes with bibasilar atelectasis. Bibasilar infiltrates/edema cannot be excluded. Small right pleural effusion. Electronically Signed   By: Marcello Moores  Register   On: 12/11/2020 08:06   DG CHEST PORT 1 VIEW  Result Date: 12/10/2020 CLINICAL DATA:  Shortness of breath.  Preoperative study. EXAM: PORTABLE CHEST 1 VIEW COMPARISON:  Chest x-ray 09/06/2014. FINDINGS: Mediastinum and hilar structures normal. Cardiomegaly. Mild pulmonary venous congestion. Right base infiltrate/edema. Small right pleural effusion. IMPRESSION: 1.  Cardiomegaly.  Mild pulmonary venous  congestion. 2.  Right base infiltrate/edema.  Small right pleural effusion. These results will be called to the ordering clinician or representative by the Radiologist Assistant, and communication documented in the PACS or [employer]. Electronically Signed   By: Marcello Moores  Register   On: 12/10/2020 12:01   DG Foot Complete Left  Result Date: 11/12/2020 CLINICAL DATA:  Draining wound about the fifth toe, initial encounter EXAM: LEFT FOOT - COMPLETE 3+ VIEW COMPARISON:  01/04/2004 FINDINGS: Considerable soft tissue swelling is noted with focal soft tissue wound at the fifth MTP joint. Fragmentation and erosive changes of the fifth proximal phalanx are seen. Some erosive change in the head of the fifth metatarsal is noted as well. These changes are consistent with osteomyelitis. Postsurgical changes in medial malleolus are again noted. No other focal abnormality is seen. IMPRESSION: Soft tissue wound with associated changes consistent with osteomyelitis about the fifth MTP joint. Electronically Signed   By: Inez Catalina M.D.   On: 11/12/2020 15:33   XR Knee 1-2 Views Left  Result Date: 12/08/2020 2 view radiographs of the left knee shows progressive collapse of the medial tibial plateau fracture.  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  He also has some changes within the fourth metatarsal and concerns for further osteomyelitis

## 2020-12-11 NOTE — Progress Notes (Signed)
Bladder scan volume shows 549m. Will in and out cath X1 per Dr. SCandiss Norse

## 2020-12-11 NOTE — Evaluation (Signed)
Physical Therapy Evaluation Patient Details Name: Todd Mccoy MRN: [id number] DOB: 1970/11/21 Today's Date: 12/11/2020   History of Present Illness  pt is a 50 y/o male who presents 6/8 with 2 problems, dehiscence of a recent left fifth ray amputation and new ulceration medial left foot.  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  S/P transtibial amputation L LE.  Pt needing min to min guard assist for basic mobility at this time.  Pt currently limited functionally due to the problems listed. ( See problems list.)   Pt will benefit from PT to maximize function and safety in order to get ready for next venue listed below.     Follow Up Recommendations CIR;Supervision/Assistance - 24 hour    Equipment Recommendations       Recommendations for Other Services       Precautions / Restrictions Precautions Precautions: Fall Restrictions Weight Bearing Restrictions: Yes LLE Weight Bearing: Non weight bearing      Mobility  Bed Mobility Overal bed mobility: Needs Assistance Bed Mobility: Supine to Sit     Supine to sit: Min guard     General bed mobility comments: cued for direction and sequencing.  no assist needed, pt bridged to EOB and came up/forward and scooted to EOB well, but tentatively due to anxious about new task.    Transfers Overall transfer level: Needs assistance   Transfers: Squat Pivot Transfers     Squat pivot transfers: Min assist     General transfer comment: cues for hand placement and general technique.  minimal assist for boost/stability with squat pivot, pt using UE's appropriately and bearing weight adequately on the R LE.  Ambulation/Gait             General Gait Details: NT today  Stairs            Wheelchair Mobility    Modified Rankin (Stroke Patients Only)       Balance  Overall balance assessment: Needs assistance Sitting-balance support: No upper extremity supported;Feet supported Sitting balance-Leahy Scale: Fair                                       Pertinent Vitals/Pain Pain Assessment: Faces Faces Pain Scale: Hurts a little bit Pain Location: L LE Pain Descriptors / Indicators: Discomfort;Other (Comment) (more  sensory than painful--phantom sensation) Pain Intervention(s): Monitored during session    Home Living Family/patient expects to be discharged to:: Private residence Living Arrangements: Children;Other relatives Available Help at Discharge: Available 24 hours/day Type of Home: House Home Access: Level entry     Home Layout: Multi-level;Able to live on main level with bedroom/bathroom Home Equipment: Crutches;Walker - 4 wheels;Cane - single point;Walker - 2 wheels;Tub bench Additional Comments: lives with son, niece, Mom who is retired.    Prior Function Level of Independence: Independent with assistive device(s)         Comments: using RW prn, otherwise independent, visual impairments at baseline has peripheral vision     Hand Dominance   Dominant Hand: Right    Extremity/Trunk Assessment   Upper Extremity Assessment Upper Extremity Assessment: Overall WFL for tasks assessed    Lower Extremity Assessment Lower Extremity Assessment: Overall WFL for tasks assessed;RLE deficits/detail;LLE deficits/detail RLE Deficits / Details: general weakness, but moves  well against gravity, assists well during transfer. RLE Sensation: history of peripheral neuropathy RLE Coordination: decreased fine motor LLE Deficits / Details: moves against gravity LLE Sensation: history of peripheral neuropathy LLE Coordination: decreased fine motor    Cervical / Trunk Assessment Cervical / Trunk Assessment: Normal  Communication   Communication: No difficulties  Cognition Arousal/Alertness: Awake/alert Behavior During  Therapy: WFL for tasks assessed/performed Overall Cognitive Status: History of cognitive impairments - at baseline                                        General Comments General comments (skin integrity, edema, etc.): Initiated discussion of phatom sensation/pain, general progression toward getting a prosthesis.    Exercises     Assessment/Plan    PT Assessment Patient needs continued PT services  PT Problem List Decreased strength;Decreased activity tolerance;Decreased mobility;Decreased knowledge of use of DME;Decreased knowledge of precautions;Impaired sensation       PT Treatment Interventions Gait training;DME instruction;Functional mobility training;Therapeutic activities;Balance training;Patient/family education    PT Goals (Current goals can be found in the Care Plan section)  Acute Rehab PT Goals Patient Stated Goal: "want to work toward getting a leg" PT Goal Formulation: With patient Time For Goal Achievement: 12/25/20 Potential to Achieve Goals: Good    Frequency Min 4X/week   Barriers to discharge        Co-evaluation               AM-PAC PT "6 Clicks" Mobility  Outcome Measure Help needed turning from your back to your side while in a flat bed without using bedrails?: A Little Help needed moving from lying on your back to sitting on the side of a flat bed without using bedrails?: A Little Help needed moving to and from a bed to a chair (including a wheelchair)?: A Little Help needed standing up from a chair using your arms (e.g., wheelchair or bedside chair)?: A Little Help needed to walk in hospital room?: A Lot Help needed climbing 3-5 steps with a railing? : A Lot 6 Click Score: 16    End of Session   Activity Tolerance: Patient tolerated treatment well Patient left: in chair;with call bell/phone within reach;with chair alarm set Nurse Communication: Mobility status PT Visit Diagnosis: Other abnormalities of gait and mobility  (R26.89);Muscle weakness (generalized) (M62.81);Difficulty in walking, not elsewhere classified (R26.2)    Time: [id number] PT Time Calculation (min) (ACUTE ONLY): 32 min   Charges:   PT Evaluation $PT Eval Moderate Complexity: 1 Mod PT Treatments $Therapeutic Activity: 8-22 mins        12/11/2020  Ginger Carne., PT Acute Rehabilitation Services [phone]  (pager) [phone]  (office)  Tessie Fass Nhyira Leano 12/11/2020, 11:42 AM

## 2020-12-11 NOTE — Progress Notes (Signed)
Initial Nutrition Assessment  DOCUMENTATION CODES:   Not applicable  INTERVENTION:   -Continue 1000 mg vitamin C daily -Continue 220 mg zinc sulfate daily -Continue 1 packet Juven BID, each packet provides 95 calories, 2.5 grams of protein (collagen), and 9.8 grams of carbohydrate (3 grams sugar); also contains 7 grams of L-arginine and L-glutamine, 300 mg vitamin C, 15 mg vitamin E, 1.2 mcg vitamin B-12, 9.5 mg zinc, 200 mg calcium, and 1.5 g  Calcium Beta-hydroxy-Beta-methylbutyrate to support wound healing  -Ensure Max po daily, each supplement provides 150 kcal and 30 grams of protein -Pt reports history of constipation; messaged MD for aggressive bowel regimen  NUTRITION DIAGNOSIS:   Increased nutrient needs related to wound healing, post-op healing as evidenced by estimated needs.  GOAL:   Patient will meet greater than or equal to 90% of their needs  MONITOR:   PO intake, Supplement acceptance, Labs, Weight trends, Skin, I & O's  REASON FOR ASSESSMENT:   Consult Assessment of nutrition requirement/status, Wound healing  ASSESSMENT:   Todd Mccoy is a 50 y.o. male with medical history significant of HTN; stage 3a CKD; and DM presenting for BKA.  He was previously admitted from 5/11-14 for sepsis associated with L wound infection and underwent L 5th ray amputation.  Since discharge, he has had progressive gangrenous changes and so the plan was made for L transtibial amputation - which was performed today.  His prior concern at the time of my evaluation is periodic constipation resulting in GERD symptoms; he wants a good bowel regimen.  He reports reasonable glycemic control at home.   He is continuing to smoke cigarettes but declines patch, reporting that he knows he has to stop now.  Pt admitted with osteomyelitis s/p lt BKA.   6/8- s/p Transtibial amputation Application of Prevena wound VAC  Reviewed I/O's: +1.2 L x 24 hours   Spoke with pt, who was sitting  in recliner chair at time of visit. He reports feeling a little better today. He reports a general decline in health over the past few weeks secondary to previous hospitalization from cellulitis infection. He repots feeling weak and endorses a decreased appetite. He is typically consuming 2 meals per day (Breakfast: oatmeal or cereal, Lunch: sandwich). On a "good day" he might a eat "full meal" (meat, starch, and vegetable). Noted meal completion 50-100%. Pt consumed about 75% of oatmeal and a few sips of milk for breakfast.   Pt shares that he often experiences constipation. His last BM was earlier this week and was very small. He has used multiple laxatives at home and reports magnesium citrate is the only intervention that works for him. He shares that he is feeling early satiety and complains of a "bubbly stomach". Pt amenable to bowel regimen. RD messaged MD regarding bowel regimen.   Per pt, his UBW is around 175-180#. He shares his weight fluctuates at baseline, but has not weighed since prior hospitalization. Reviewed wt hx; pt has experienced a 11.6% wt loss over the past month, which is significant for time frame.  Medications reviewed and include lactated ringers infusion @ 125 ml/hr, colace, vitamin C, and zinc sulfate  Lab Results  Component Value Date   HGBA1C 8.6 (H) 11/12/2020   PTA DM medications are 35 units insulin aspart protamine-aspart BID, 30 units insulin glargine daily and 500 mg metformin BID. Discussed home glycemic control with pt; he reports his CBGS are always high in the morning (around 300). During the day, readings  usually run around 120-160. Per pt, he was prescribed 60 units of long acting insulin, but does not take the full dose, as "it is too much". He reports taking 30 units in the morning and will sometimes take another 30 units throughout the day depending on blood sugar readings. Discussed with pt importance of good glycemic control to promote healing, including  taking medications as prescribed (pt is uninsured and followed by [facility]). He denies having difficulty affording medications or test strips.   Discussed importance of good meal and supplement intake to promote healing. Pt has drank Juven this morning and is willing to continue. Will also add Ensure Max secondary to increased protein needs.   Albumin has a half-life of 21 days and is strongly affected by stress response and inflammatory process, therefore, do not expect to see an improvement in this lab value during acute hospitalization.  When a patient presents with low albumin, it is likely skewed due to the acute inflammatory response.  Unless it is suspected that patient had poor PO intake or malnutrition prior to admission, then RD should not be consulted solely for low albumin. Note that low albumin is no longer used to diagnose malnutrition; [facility] uses the new malnutrition guidelines published by the American Society for Parenteral and Enteral Nutrition (A.S.P.E.N.) and the Academy of Nutrition and Dietetics (AND).     Labs reviewed: Na: 133, CBGS: 283 (inpatient orders for glycemic control are 0-5 units insulin aspart daily at bedtime, 0-9 units insulin aspart TID with meals, 2 units insulin aspart TID with meals and 35 units insulin glargine daily).     NUTRITION - FOCUSED PHYSICAL EXAM:  Flowsheet Row Most Recent Value  Orbital Region No depletion  Upper Arm Region No depletion  Thoracic and Lumbar Region No depletion  Buccal Region No depletion  Temple Region No depletion  Clavicle Bone Region No depletion  Clavicle and Acromion Bone Region No depletion  Scapular Bone Region No depletion  Dorsal Hand No depletion  Patellar Region Mild depletion  Anterior Thigh Region Mild depletion  Posterior Calf Region Mild depletion  Edema (RD Assessment) None  Hair Reviewed  Eyes Reviewed  Mouth Reviewed  Skin Reviewed  Nails Reviewed        Diet Order:   Diet Order             Diet Carb Modified Fluid consistency: Thin; Room service appropriate? Yes  Diet effective now                   EDUCATION NEEDS:   Education needs have been addressed  Skin:  Skin Assessment: Skin Integrity Issues: Skin Integrity Issues:: Wound VAC Wound Vac: lt BKA  Last BM:  12/09/20  Height:   Ht Readings from Last 1 Encounters:  12/10/20 '5\' 10"'$  (1.778 m)    Weight:   Wt Readings from Last 1 Encounters:  12/10/20 79.4 kg    Ideal Body Weight:  70.6 kg  BMI:  Body mass index is 25.11 kg/m.  Estimated Nutritional Needs:   Kcal:  2100-2300  Protein:  125-140 grams  Fluid:  > 2 L    Loistine Chance, RD, LDN, [place] Registered Dietitian II Certified Diabetes Care and Education Specialist Please refer to [facility] for RD and/or RD on-call/weekend/after hours pager

## 2020-12-11 NOTE — Progress Notes (Signed)
IP rehab admissions - I met with patient, his mom  and his brother.  I gave patient rehab booklets and explained inpatient rehab.  Patient has no insurance.  I went over potential costs for daily care.  Patient is hopeful that he will progress and can DC home when medically ready.  I will have my partner follow up again tomorrow.  Call for questions.  [phone]

## 2020-12-12 LAB — GLUCOSE, CAPILLARY
Glucose-Capillary: 108 mg/dL — ABNORMAL HIGH (ref 70–99)
Glucose-Capillary: 115 mg/dL — ABNORMAL HIGH (ref 70–99)
Glucose-Capillary: 180 mg/dL — ABNORMAL HIGH (ref 70–99)
Glucose-Capillary: 270 mg/dL — ABNORMAL HIGH (ref 70–99)
Glucose-Capillary: 283 mg/dL — ABNORMAL HIGH (ref 70–99)
Glucose-Capillary: 97 mg/dL (ref 70–99)

## 2020-12-12 LAB — COMPREHENSIVE METABOLIC PANEL
ALT: 7 U/L (ref 0–44)
AST: 13 U/L — ABNORMAL LOW (ref 15–41)
Albumin: 1.3 g/dL — ABNORMAL LOW (ref 3.5–5.0)
Alkaline Phosphatase: 94 U/L (ref 38–126)
Anion gap: 9 (ref 5–15)
BUN: 61 mg/dL — ABNORMAL HIGH (ref 6–20)
CO2: 28 mmol/L (ref 22–32)
Calcium: 7.7 mg/dL — ABNORMAL LOW (ref 8.9–10.3)
Chloride: 95 mmol/L — ABNORMAL LOW (ref 98–111)
Creatinine, Ser: 3.36 mg/dL — ABNORMAL HIGH (ref 0.61–1.24)
GFR, Estimated: 22 mL/min — ABNORMAL LOW (ref >60)
Glucose, Bld: 120 mg/dL — ABNORMAL HIGH (ref 70–99)
Potassium: 4 mmol/L (ref 3.5–5.1)
Sodium: 132 mmol/L — ABNORMAL LOW (ref 135–145)
Total Bilirubin: 0.6 mg/dL (ref 0.3–1.2)
Total Protein: 6.5 g/dL (ref 6.5–8.1)

## 2020-12-12 LAB — CBC WITH DIFFERENTIAL/PLATELET
Abs Immature Granulocytes: 0.31 10*3/uL — ABNORMAL HIGH (ref 0.00–0.07)
Basophils Absolute: 0 10*3/uL (ref 0.0–0.1)
Basophils Relative: 0 %
Eosinophils Absolute: 0.2 10*3/uL (ref 0.0–0.5)
Eosinophils Relative: 1 %
HCT: 23.7 % — ABNORMAL LOW (ref 39.0–52.0)
Hemoglobin: 7.1 g/dL — ABNORMAL LOW (ref 13.0–17.0)
Immature Granulocytes: 2 %
Lymphocytes Relative: 11 %
Lymphs Abs: 2.1 10*3/uL (ref 0.7–4.0)
MCH: 24 pg — ABNORMAL LOW (ref 26.0–34.0)
MCHC: 30 g/dL (ref 30.0–36.0)
MCV: 80.1 fL (ref 80.0–100.0)
Monocytes Absolute: 1.3 10*3/uL — ABNORMAL HIGH (ref 0.1–1.0)
Monocytes Relative: 7 %
Neutro Abs: 15.5 10*3/uL — ABNORMAL HIGH (ref 1.7–7.7)
Neutrophils Relative %: 79 %
Platelets: 559 10*3/uL — ABNORMAL HIGH (ref 150–400)
RBC: 2.96 MIL/uL — ABNORMAL LOW (ref 4.22–5.81)
RDW: 19.6 % — ABNORMAL HIGH (ref 11.5–15.5)
WBC: 19.5 10*3/uL — ABNORMAL HIGH (ref 4.0–10.5)
nRBC: 0 % (ref 0.0–0.2)

## 2020-12-12 LAB — HEMOGLOBIN AND HEMATOCRIT, BLOOD
HCT: 28.9 % — ABNORMAL LOW (ref 39.0–52.0)
Hemoglobin: 9 g/dL — ABNORMAL LOW (ref 13.0–17.0)

## 2020-12-12 LAB — PROCALCITONIN: Procalcitonin: 0.71 ng/mL

## 2020-12-12 LAB — MAGNESIUM: Magnesium: 2 mg/dL (ref 1.7–2.4)

## 2020-12-12 LAB — BRAIN NATRIURETIC PEPTIDE: B Natriuretic Peptide: 939.6 pg/mL — ABNORMAL HIGH (ref 0.0–100.0)

## 2020-12-12 MED ORDER — BOOST PLUS PO LIQD: 237.0000 mL | Freq: Three times a day (TID) | ORAL | Status: DC

## 2020-12-12 MED ORDER — SODIUM CHLORIDE 0.9% IV SOLUTION: Freq: Once | INTRAVENOUS | Status: AC

## 2020-12-12 MED ORDER — INSULIN ASPART 100 UNIT/ML IJ SOLN
0.0000 [IU] | Freq: Three times a day (TID) | INTRAMUSCULAR | Status: DC
  Administered 2020-12-12: 5 [IU] via SUBCUTANEOUS
  Administered 2020-12-12: 2 [IU] via SUBCUTANEOUS
  Administered 2020-12-13: 5 [IU] via SUBCUTANEOUS
  Administered 2020-12-13 – 2020-12-14 (×4): 3 [IU] via SUBCUTANEOUS
  Administered 2020-12-14: 5 [IU] via SUBCUTANEOUS
  Administered 2020-12-15: 3 [IU] via SUBCUTANEOUS
  Administered 2020-12-15: 2 [IU] via SUBCUTANEOUS
  Administered 2020-12-15: 3 [IU] via SUBCUTANEOUS
  Administered 2020-12-16: 5 [IU] via SUBCUTANEOUS
  Administered 2020-12-16 – 2020-12-17 (×4): 2 [IU] via SUBCUTANEOUS
  Administered 2020-12-17: 3 [IU] via SUBCUTANEOUS
  Administered 2020-12-18: 2 [IU] via SUBCUTANEOUS
  Administered 2020-12-19 (×2): 1 [IU] via SUBCUTANEOUS
  Administered 2020-12-20 – 2020-12-22 (×3): 2 [IU] via SUBCUTANEOUS
  Administered 2020-12-22: 1 [IU] via SUBCUTANEOUS

## 2020-12-12 MED ORDER — LACTATED RINGERS IV SOLN: INTRAVENOUS | Status: DC

## 2020-12-12 MED ORDER — NAPHAZOLINE-GLYCERIN 0.012-0.25 % OP SOLN
1.0000 [drp] | Freq: Four times a day (QID) | OPHTHALMIC | Status: DC | PRN
Start: 2020-12-12 — End: 2020-12-22
  Administered 2020-12-17: 2 [drp] via OPHTHALMIC
  Filled 2020-12-12 (×2): qty 15

## 2020-12-12 MED ORDER — PANTOPRAZOLE SODIUM 40 MG PO TBEC
40.0000 mg | DELAYED_RELEASE_TABLET | Freq: Two times a day (BID) | ORAL | Status: DC
  Administered 2020-12-12 – 2020-12-22 (×20): 40 mg via ORAL
  Filled 2020-12-12 (×20): qty 1

## 2020-12-12 MED ORDER — INSULIN ASPART 100 UNIT/ML IJ SOLN
0.0000 [IU] | Freq: Every day | INTRAMUSCULAR | Status: DC
  Administered 2020-12-12: 3 [IU] via SUBCUTANEOUS
  Administered 2020-12-13 – 2020-12-14 (×2): 2 [IU] via SUBCUTANEOUS
  Administered 2020-12-15: 3 [IU] via SUBCUTANEOUS
  Administered 2020-12-16: 4 [IU] via SUBCUTANEOUS
  Administered 2020-12-21: 2 [IU] via SUBCUTANEOUS

## 2020-12-12 NOTE — Progress Notes (Addendum)
Physical Therapy Treatment Patient Details Name: Todd Mccoy MRN: [id number] DOB: 10-25-1970 Today's Date: 12/12/2020    History of Present Illness Pt is a 50 y/o male who presents 6/8   dehiscence of a recent left fifth ray amputation and new ulceration medial left foot.  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 With assistance for mobility at home and equipment feel he can return home. Pt needs concrete instructions and repetition for new task. Expect will do well when he can have things set up in his home. Pt has steps to enter home. Ultimately recommend a ramp for home and until he has one in place he likely will need to scoot up on buttocks and have someone assist him up to w/c at top of stairs.    Follow Up Recommendations  Home health PT;Supervision for mobility/OOB     Equipment Recommendations  Wheelchair (measurements PT);Wheelchair cushion (measurements PT) (16 or 18" with removable armrests and elevating legrests)    Recommendations for Other Services       Precautions / Restrictions Precautions Precautions: Fall Required Braces or Orthoses: Other Brace Other Brace: limb protector    Mobility  Bed Mobility Overal bed mobility: Modified Independent Bed Mobility: Supine to Sit     Supine to sit: Modified independent (Device/Increase time)          Transfers Overall transfer level: Needs assistance Equipment used: Rolling walker (2 wheeled);None Transfers: Public house manager;Sit to/from [facility];Lateral/Scoot Transfers Sit to Stand: Min guard   Squat pivot transfers: Min guard    Lateral/Scoot Transfers: Min guard General transfer comment: Assist for safety and verbal cues for technique especially in regards to hand placement  Ambulation/Gait Ambulation/Gait assistance: Min assist Gait Distance (Feet): 6  Feet Assistive device: Rolling walker (2 wheeled) Gait Pattern/deviations: Step-to pattern (hop to) Gait velocity: decr Gait velocity interpretation: <1.31 ft/sec, indicative of household ambulator General Gait Details: Assist to stabilize and verbal cues for technique   Stairs             Wheelchair Mobility    Modified Rankin (Stroke Patients Only)       Balance Overall balance assessment: Needs assistance Sitting-balance support: No upper extremity supported;Feet supported Sitting balance-Leahy Scale: Good     Standing balance support: Bilateral upper extremity supported;During functional activity Standing balance-Leahy Scale: Poor Standing balance comment: walker and min guard for static standing                            Cognition Arousal/Alertness: Awake/alert Behavior During Therapy: WFL for tasks assessed/performed Overall Cognitive Status: Within Functional Limits for tasks assessed                                 General Comments: Pt is very concrete and needs specific instructions especially for new task.      Exercises Amputee Exercises Quad Sets: Strengthening;Left;5 reps;Supine    General Comments        Pertinent Vitals/Pain Pain Assessment: Faces Faces Pain Scale: Hurts a little bit Pain Location: L LE Pain Descriptors / Indicators: Discomfort;Other (Comment) (phantom sensation) Pain Intervention(s): Limited activity within patient's tolerance;Monitored during session    Home Living  Prior Function            PT Goals (current goals can now be found in the care plan section) Progress towards PT goals: Progressing toward goals    Frequency    Min 4X/week      PT Plan Discharge plan needs to be updated    Co-evaluation              AM-PAC PT "6 Clicks" Mobility   Outcome Measure  Help needed turning from your back to your side while in a flat bed without using  bedrails?: A Little Help needed moving from lying on your back to sitting on the side of a flat bed without using bedrails?: A Little Help needed moving to and from a bed to a chair (including a wheelchair)?: A Little Help needed standing up from a chair using your arms (e.g., wheelchair or bedside chair)?: A Little Help needed to walk in hospital room?: A Little Help needed climbing 3-5 steps with a railing? : A Lot 6 Click Score: 17    End of Session Equipment Utilized During Treatment: Gait belt Activity Tolerance: Patient tolerated treatment well Patient left: in chair;with call bell/phone within reach;with family/visitor present Nurse Communication: Mobility status PT Visit Diagnosis: Other abnormalities of gait and mobility (R26.89);Muscle weakness (generalized) (M62.81);Difficulty in walking, not elsewhere classified (R26.2)     Time: [id number] PT Time Calculation (min) (ACUTE ONLY): 35 min  Charges:  $Gait Training: 8-22 mins $Therapeutic Activity: 8-22 mins                     [place] Pager [phone] Office [place] 12/12/2020, 1:34 PM

## 2020-12-12 NOTE — Progress Notes (Signed)
PROGRESS NOTE                                                                                                                                                                                                             Patient Demographics:    Todd Mccoy, is a 50 y.o. male, DOB - [birth year], [id number]  Outpatient Primary MD for the patient is [name], MD    LOS - 2  Admit date - 12/10/2020    CC - L.Foot infection     Brief Narrative (HPI from H&P) Todd Mccoy is a 50 y.o. 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   Subjective:   Patient in bed, appears comfortable, denies any headache, no fever, no chest pain or pressure, no shortness of breath , no abdominal pain. No new focal weakness.    Assessment  & Plan :     Recurrent left foot infection.  S/p BKA.  Defer management to primary team orthopedics, stump site under bandage.  Will stop antibiotics after today.  Case discussed with orthopedics.  2.  AKI on CKD stage IIIa.  Baseline creatinine around 1.7, he appears severely dehydrated, hydrate with IV fluids, avoid nephrotoxins, nonacute renal ultrasound UA suggestive of prerenal azotemia, continue with hydration and replacement of packed RBCs as appropriate.  3.  Dyslipidemia.  On statin continue.  4.  Tobacco abuse.  Counseled to quit.  5.  Perioperative blood loss related anemia.  Received 1 unit of packed RBC earlier this admission receiving second unit on 12/12/2020, no signs of ongoing bleeding, on twice daily PPI, will monitor stools.  6.  DM type II.  Currently on SSI, increase Lantus dose.  Lab Results  Component Value Date   HGBA1C 8.6 (H) 11/12/2020     CBG (last 3)  Recent Labs    12/12/20 0005 12/12/20 0507 12/12/20 0743  GLUCAP 115* 108* 97         Condition - Fair  Family Communication  : Per Primary team - Ortho  Code Status :  Full  Consults  :  TRH consulting  PUD Prophylaxis :  Procedures  :     L BKA  Renal US - Non acute      Disposition Plan  :    Status is: Inpatient  Remains inpatient appropriate because:IV treatments appropriate due to intensity of illness or inability to take PO  Dispo: The patient is from: Home              Anticipated d/c is to: SNF              Patient currently is not medically stable to d/c.   Difficult to place patient No   DVT Prophylaxis  : Heparin added 12/11/2020.  heparin injection 5,000 Units Start: 12/11/20 1400 SCD's Start: 12/10/20 1712   Lab Results  Component Value Date   PLT 559 (H) 12/12/2020    Diet :  Diet Order             Diet Carb Modified Fluid consistency: Thin; Room service appropriate? Yes  Diet effective now                    Inpatient Medications  Scheduled Meds:  sodium chloride   Intravenous Once   vitamin C  1,000 mg Oral Daily   atorvastatin  20 mg Oral Daily   docusate sodium  200 mg Oral BID   gabapentin  300 mg Oral QHS   heparin injection (subcutaneous)  5,000 Units Subcutaneous Q8H   insulin aspart  0-5 Units Subcutaneous QHS   insulin aspart  0-9 Units Subcutaneous TID WC   insulin glargine  15 Units Subcutaneous Daily   multivitamin with minerals  1 tablet Oral Daily   nutrition supplement (JUVEN)  1 packet Oral BID BM   pantoprazole  40 mg Oral BID   polyethylene glycol  17 g Oral BID   Ensure Max Protein  11 oz Oral QHS   tamsulosin  0.4 mg Oral Daily   zinc sulfate  220 mg Oral Daily   Continuous Infusions:  lactated ringers     PRN Meds:.acetaminophen, alum & mag hydroxide-simeth, bisacodyl, guaiFENesin-dextromethorphan, hydrALAZINE, HYDROmorphone (DILAUDID) injection, naphazoline-glycerin,  ondansetron, oxyCODONE, phenol  Antibiotics  :    Anti-infectives (From admission, onward)    Start     Dose/Rate Route Frequency Ordered Stop   12/10/20 2000  cefTRIAXone (ROCEPHIN) 2 g in sodium chloride 0.9 % 100 mL IVPB       See Hyperspace for full Linked Orders Report.   2 g 200 mL/hr over 30 Minutes Intravenous Every 24 hours 12/10/20 1853 12/11/20 2359   12/10/20 2000  metroNIDAZOLE (FLAGYL) IVPB 500 mg       See Hyperspace for full Linked Orders Report.   500 mg 100 mL/hr over 60 Minutes Intravenous Every 8 hours 12/10/20 1853 12/11/20 2359   12/10/20 1715  ceFAZolin (ANCEF) IVPB 2g/100 mL premix  Status:  Discontinued        2 g 200 mL/hr over 30 Minutes Intravenous Every 8 hours 12/10/20 1711 12/10/20 1853   12/10/20 1245  vancomycin (VANCOREADY) IVPB 1000 mg/200 mL        1,000 mg 200 mL/hr over 60 Minutes Intravenous  Once 12/10/20 1241 12/10/20 1350   12/10/20 1135  vancomycin (VANCOCIN) 1-5 GM/200ML-% IVPB       Note to Pharmacy: Renda Rolls   : cabinet override      12/10/20 1135 12/10/20 1247   12/10/20 1130  ceFAZolin (ANCEF) IVPB 2g/100 mL premix        2 g  200 mL/hr over 30 Minutes Intravenous On call to O.R. 12/10/20 1122 12/10/20 1322        Time Spent in minutes  30   Lala Lund M.D on 12/12/2020 at 12:11 PM  To page go to [email or web address]   [facility] -  Office  [phone]   See all Orders from today for further details    Objective:   Vitals:   12/12/20 0447 12/12/20 0815 12/12/20 0845 12/12/20 1015  BP: 135/78 (!) 151/93 136/88 (!) 142/88  Pulse: 94 84 91 96  Resp: '18 16 18 17  '$ Temp: 97.7 F (36.5 C) 97.8 F (36.6 C) 97.6 F (36.4 C) 97.7 F (36.5 C)  TempSrc: Oral Oral Oral Oral  SpO2: 100% 100% 100% 100%  Weight:      Height:        Wt Readings from Last 3 Encounters:  12/10/20 79.4 kg  11/12/20 89.8 kg  10/22/20 89.8 kg     Intake/Output Summary (Last 24 hours) at 12/12/2020 1211 Last data filed at  12/12/2020 1018 Gross per 24 hour  Intake 3608.65 ml  Output 700 ml  Net 2908.65 ml     Physical Exam  Awake Alert, No new F.N deficits, Legally blind Sharon.AT,PERRAL Supple Neck,No JVD, No cervical lymphadenopathy appriciated.  Symmetrical Chest wall movement, Good air movement bilaterally, CTAB RRR,No Gallops, Rubs or new Murmurs, No Parasternal Heave +ve B.Sounds, Abd Soft, No tenderness, No organomegaly appriciated, No rebound - guarding or rigidity. L. BKA    Data Review:    CBC Recent Labs  Lab 12/10/20 1125 12/11/20 0236 12/12/20 0150  WBC 35.8* 22.2* 19.5*  HGB 7.8* 8.2* 7.1*  HCT 26.2* 25.9* 23.7*  PLT 608* 528* 559*  MCV 80.6 78.0* 80.1  MCH 24.0* 24.7* 24.0*  MCHC 29.8* 31.7 30.0  RDW 20.3* 19.3* 19.6*  LYMPHSABS  --   --  2.1  MONOABS  --   --  1.3*  EOSABS  --   --  0.2  BASOSABS  --   --  0.0    Recent Labs  Lab 12/10/20 1901 12/11/20 0236 12/11/20 0721 12/12/20 0150  NA  --  133*  --  132*  K  --  3.6  --  4.0  CL  --  95*  --  95*  CO2  --  26  --  28  GLUCOSE  --  268*  --  120*  BUN  --  51*  --  61*  CREATININE  --  3.25*  --  3.36*  CALCIUM  --  7.9*  --  7.7*  AST  --   --   --  13*  ALT  --   --   --  7  ALKPHOS  --   --   --  94  BILITOT  --   --   --  0.6  ALBUMIN  --   --   --  1.3*  MG  --   --  1.6* 2.0  CRP 29.8*  --   --   --   PROCALCITON  --   --  0.83 0.71  BNP  --   --  1,024.0* 939.6*    ------------------------------------------------------------------------------------------------------------------ No results for input(s): CHOL, HDL, LDLCALC, TRIG, CHOLHDL, LDLDIRECT in the last 72 hours.  Lab Results  Component Value Date   HGBA1C 8.6 (H) 11/12/2020   ------------------------------------------------------------------------------------------------------------------ No results for input(s): TSH, T4TOTAL, T3FREE, THYROIDAB in the last 72 hours.  Invalid input(s): FREET3  Cardiac Enzymes No results for  input(s): CKMB, TROPONINI, MYOGLOBIN in the last 168 hours.  Invalid input(s): CK ------------------------------------------------------------------------------------------------------------------    Component Value Date/Time   BNP 939.6 (H) 12/12/2020 0150    Micro Results Recent Results (from the past 240 hour(s))  SARS Coronavirus 2 by RT PCR (hospital order, performed in [facility] hospital lab) Nasopharyngeal Nasopharyngeal Swab     Status: None   Collection Time: 12/10/20 11:25 AM   Specimen: Nasopharyngeal Swab  Result Value Ref Range Status   SARS Coronavirus 2 NEGATIVE NEGATIVE Final    Comment: Performed at [facility], [place] [address] [place], [place] [address]  Culture, blood (routine x 2)     Status: None (Preliminary result)   Collection Time: 12/10/20  7:01 PM   Specimen: BLOOD  Result Value Ref Range Status   Specimen Description BLOOD SITE NOT SPECIFIED  Final   Special Requests   Final    BOTTLES DRAWN AEROBIC AND ANAEROBIC Blood Culture adequate volume   Culture   Final    NO GROWTH 2 DAYS Performed at [facility], [place] [address] [place], [place] [address]    Report Status PENDING  Incomplete  Culture, blood (routine x 2)     Status: None (Preliminary result)   Collection Time: 12/10/20  7:01 PM   Specimen: BLOOD  Result Value Ref Range Status   Specimen Description BLOOD SITE NOT SPECIFIED  Final   Special Requests   Final    BOTTLES DRAWN AEROBIC ONLY Blood Culture results may not be optimal due to an inadequate volume of blood received in culture bottles   Culture   Final    NO GROWTH 2 DAYS Performed at [facility], [place] [address] [place], [place] [address]    Report Status PENDING  Incomplete    Radiology Reports US RENAL  Result Date: 12/12/2020 CLINICAL DATA:  Acute renal injury. EXAM: RENAL / URINARY TRACT ULTRASOUND COMPLETE COMPARISON:  CT abdomen pelvis 09/07/2014 FINDINGS: Right Kidney: Renal measurements: 12.6 x 5 x  6 cm = volume: 198 mL. Echogenicity within normal limits. No mass or hydronephrosis visualized. Left Kidney: Renal measurements: 12.8 x 5.3 x 5.4 cm = volume: 189 mL. Echogenicity within normal limits. No mass or hydronephrosis visualized. Urinary bladder: Thickened bladder wall. Otherwise appears normal for degree of bladder distention. Other: At least small volume ascites.  Right pleural effusion. IMPRESSION: 1. Thickened urinary bladder wall. Correlate with urinalysis for infection. 2. At least small volume ascites. 3. Right pleural effusion. Electronically Signed   By: Iven Finn M.D.   On: 12/12/2020 05:01   DG Chest Port 1 View  Result Date: 12/11/2020 CLINICAL DATA:  Shortness of breath. EXAM: PORTABLE CHEST 1 VIEW COMPARISON:  12/10/2020. FINDINGS: Cardiomegaly with mild pulmonary venous congestion. Low lung volumes with bibasilar atelectasis. Bibasilar infiltrates/edema cannot be excluded. Small right pleural effusion. No pneumothorax. IMPRESSION: Cardiomegaly with mild pulmonary venous congestion. 2. Low lung volumes with bibasilar atelectasis. Bibasilar infiltrates/edema cannot be excluded. Small right pleural effusion. Electronically Signed   By: Marcello Moores  Register   On: 12/11/2020 08:06   DG CHEST PORT 1 VIEW  Result Date: 12/10/2020 CLINICAL DATA:  Shortness of breath.  Preoperative study. EXAM: PORTABLE CHEST 1 VIEW COMPARISON:  Chest x-ray 09/06/2014. FINDINGS: Mediastinum and hilar structures normal. Cardiomegaly. Mild pulmonary venous congestion. Right base infiltrate/edema. Small right pleural effusion. IMPRESSION: 1.  Cardiomegaly.  Mild pulmonary venous congestion. 2.  Right base infiltrate/edema.  Small  right pleural effusion. These results will be called to the ordering clinician or representative by the Radiologist Assistant, and communication documented in the PACS or [employer]. Electronically Signed   By: Marcello Moores  Register   On: 12/10/2020 12:01   DG Foot Complete  Left  Result Date: 11/12/2020 CLINICAL DATA:  Draining wound about the fifth toe, initial encounter EXAM: LEFT FOOT - COMPLETE 3+ VIEW COMPARISON:  01/04/2004 FINDINGS: Considerable soft tissue swelling is noted with focal soft tissue wound at the fifth MTP joint. Fragmentation and erosive changes of the fifth proximal phalanx are seen. Some erosive change in the head of the fifth metatarsal is noted as well. These changes are consistent with osteomyelitis. Postsurgical changes in medial malleolus are again noted. No other focal abnormality is seen. IMPRESSION: Soft tissue wound with associated changes consistent with osteomyelitis about the fifth MTP joint. Electronically Signed   By: Inez Catalina M.D.   On: 11/12/2020 15:33   XR Knee 1-2 Views Left  Result Date: 12/08/2020 2 view radiographs of the left knee shows progressive collapse of the medial tibial plateau fracture.  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  He also has some changes within the fourth metatarsal and concerns for further osteomyelitis

## 2020-12-12 NOTE — Progress Notes (Signed)
Patient is postop day 2 status post below-knee amputation.  He is sitting in bed quite comfortable  Vital signs stable he is dropped his hemoglobin and hematocrit 7.1 in 23.7 this morning.  Wound VAC is in place however unit is not charged and is not working.  Albumin is 1.3.  BUN and creatinine elevated.  Appreciate hospitalist helping manage comorbidities.  Will work with physical therapy.  Patient does not think he has any options except to discharge home when able.Marland Kitchen  VAC was plugged in and reset.  Was functioning with good suction

## 2020-12-12 NOTE — TOC Initial Note (Signed)
Transition of Care [facility]) - Initial/Assessment Note    Patient Details  Name: Todd Mccoy MRN: [id number] Date of Birth: 1970-11-21  Transition of Care [facility]) CM/SW Contact:    Bethena Roys, RN Phone Number: 12/12/2020, 5:05 PM  Clinical Narrative:  Case Manager received a secure chat from PT that the patient will benefit from a wheelchair. Case Manager has called several agencies and no wheelchairs in [place]. Case Manager checked with CIR and they don't have any available as well. Case Manager discussed with [facility] Liaison and the patient is on the waiting list for a wheelchair.  Case Manager reached out to [facility] for charity PT and they cannot accept the patient at this time due to the patient has on a prevena wound vac. Case Manager explained that the surgeons office will take the prevena wound vac off and the office still would not accept.              Expected Discharge Plan: Home/Self Care Barriers to Discharge: Continued Medical Work up   Patient Goals and CMS Choice Patient states their goals for this hospitalization and ongoing recovery are:: patient wants to return home.   Choice offered to / list presented to : NA ([place] declined the patient 2/2 prevena)  Expected Discharge Plan and Services Expected Discharge Plan: Home/Self Care   Discharge Planning Services: CM Consult Post Acute Care Choice: Durable Medical Equipment (trying to get a wheelchair) Living arrangements for the past 2 months: Single Family Home                           HH Arranged: PT          Prior Living Arrangements/Services Living arrangements for the past 2 months: Single Family Home Lives with:: Self, Relatives, Parents Patient language and need for interpreter reviewed:: Yes Do you feel safe going back to the place where you live?: Yes      Need for Family Participation in Patient Care: Yes (Comment) Care giver support  system in place?: Yes (comment)   Criminal Activity/Legal Involvement Pertinent to Current Situation/Hospitalization: No - Comment as needed  Activities of Daily Living Home Assistive Devices/Equipment: Crutches, Blood pressure cuff, CBG Meter, Shower chair with back, Eyeglasses, Walker (specify type) ADL Screening (condition at time of admission) Patient's cognitive ability adequate to safely complete daily activities?: Yes Is the patient deaf or have difficulty hearing?: No Does the patient have difficulty seeing, even when wearing glasses/contacts?: Yes Does the patient have difficulty concentrating, remembering, or making decisions?: No Patient able to express need for assistance with ADLs?: Yes Does the patient have difficulty dressing or bathing?: Yes Independently performs ADLs?: Yes (appropriate for developmental age) Does the patient have difficulty walking or climbing stairs?: Yes Weakness of Legs: Both Weakness of Arms/Hands: Both  Permission Sought/Granted Permission sought to share information with : Chartered certified accountant granted to share information with : Yes, Verbal Permission Granted     Permission granted to share info w AGENCY: [facility], [facility]        Emotional Assessment Appearance:: Appears stated age Attitude/Demeanor/Rapport: Engaged Affect (typically observed): Appropriate Orientation: : Oriented to Situation, Oriented to  Time, Oriented to Place, Oriented to Self Alcohol / Substance Use: Not Applicable Psych Involvement: No (comment)  Admission diagnosis:  Abscess of left foot [L02.612] Patient Active Problem List   Diagnosis Date Noted   Abscess  of left foot 12/10/2020   S/P BKA (below knee amputation) unilateral, left ([place]) 12/10/2020   Dyslipidemia 12/10/2020   Stage 3a chronic kidney disease ([place]) 12/10/2020   Subacute osteomyelitis, left ankle and foot (HCC)    Osteomyelitis of fifth toe of left foot (HCC)     Severe protein-calorie malnutrition ([place])    HTN (hypertension) 11/12/2020   Osteomyelitis of second toe of right foot ([place])    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 [address] Phone: [phone] Fax: [phone]  [place] - [address] [place]) [place] [address] Phone: [phone] Fax: [phone]    Readmission Risk Interventions No flowsheet data found.

## 2020-12-12 NOTE — Progress Notes (Signed)
Inpatient Diabetes Program Recommendations  AACE/ADA: New Consensus Statement on Inpatient Glycemic Control (2015)  Target Ranges:  Prepandial:   less than 140 mg/dL      Peak postprandial:   less than 180 mg/dL (1-2 hours)      Critically ill patients:  140 - 180 mg/dL   Lab Results  Component Value Date   GLUCAP 270 (H) 12/12/2020   HGBA1C 8.6 (H) 11/12/2020    Review of Glycemic Control  Diabetes history: DM2 Outpatient Diabetes medications: Lantus 30 QAM, metformin 500 mg BID Current orders for Inpatient glycemic control: Lantus 15 units QD, Novolog 0-9 units TID with meals and 0-5 HS  HgbA1C - 8.6%  Inpatient Diabetes Program Recommendations:    Increase Lantus to 18 units QD Add Novolog 2 units TID with meals if eating > 50% meal Decrease Novolog to 0-6 units TID with meals  Spoke with pt earlier today about his HgbA1C of 8.6%. Has concerns about hypoglycemia. States he doesn't take Lantus if CBG is 120 or below. States he thinks he needs lower dose of Lantus. Has had hypoglycemia in the past. Discussed s/s and treatment. Michela Pitcher he has done better recently with his food and beverages, tries to eat healthy most of the time. Monitors blood sugars at home. Goes to [facility] for his diabetes and has appt coming up in July. Instructed him to call [facility] if he had questions/concerns about his Lantus dose. Stressed importance of not skipping it. Answered questions and pt appreciative of call.  Continue to follow. Needs tight control for healing.   Thank you. Lorenda Peck, RD, LDN, CDE Inpatient Diabetes Coordinator [phone]

## 2020-12-12 NOTE — Progress Notes (Signed)
Inpatient Rehab Admissions Coordinator:   Pt. Continues to decline CIR due to cost of care, preferring to go home with support from family. CIR will sign off.  Clemens Catholic, [place], [place] Admissions Coordinator  [phone] ([place]) [phone] (office)

## 2020-12-13 LAB — CBC WITH DIFFERENTIAL/PLATELET
Abs Immature Granulocytes: 0.29 10*3/uL — ABNORMAL HIGH (ref 0.00–0.07)
Basophils Absolute: 0 10*3/uL (ref 0.0–0.1)
Basophils Relative: 0 %
Eosinophils Absolute: 0.2 10*3/uL (ref 0.0–0.5)
Eosinophils Relative: 1 %
HCT: 24.1 % — ABNORMAL LOW (ref 39.0–52.0)
Hemoglobin: 7.6 g/dL — ABNORMAL LOW (ref 13.0–17.0)
Immature Granulocytes: 2 %
Lymphocytes Relative: 12 %
Lymphs Abs: 1.9 10*3/uL (ref 0.7–4.0)
MCH: 24.8 pg — ABNORMAL LOW (ref 26.0–34.0)
MCHC: 31.5 g/dL (ref 30.0–36.0)
MCV: 78.8 fL — ABNORMAL LOW (ref 80.0–100.0)
Monocytes Absolute: 1.2 10*3/uL — ABNORMAL HIGH (ref 0.1–1.0)
Monocytes Relative: 8 %
Neutro Abs: 13 10*3/uL — ABNORMAL HIGH (ref 1.7–7.7)
Neutrophils Relative %: 77 %
Platelets: 469 10*3/uL — ABNORMAL HIGH (ref 150–400)
RBC: 3.06 MIL/uL — ABNORMAL LOW (ref 4.22–5.81)
RDW: 19 % — ABNORMAL HIGH (ref 11.5–15.5)
WBC: 16.6 10*3/uL — ABNORMAL HIGH (ref 4.0–10.5)
nRBC: 0 % (ref 0.0–0.2)

## 2020-12-13 LAB — TYPE AND SCREEN
ABO/RH(D): B POS
Antibody Screen: NEGATIVE
Unit division: 0
Unit division: 0

## 2020-12-13 LAB — GLUCOSE, CAPILLARY
Glucose-Capillary: 233 mg/dL — ABNORMAL HIGH (ref 70–99)
Glucose-Capillary: 259 mg/dL — ABNORMAL HIGH (ref 70–99)
Glucose-Capillary: 230 mg/dL — ABNORMAL HIGH (ref 70–99)
Glucose-Capillary: 227 mg/dL — ABNORMAL HIGH (ref 70–99)

## 2020-12-13 LAB — BPAM RBC
Blood Product Expiration Date: 202206232359
Blood Product Expiration Date: 202206232359
ISSUE DATE / TIME: 202206081411
ISSUE DATE / TIME: 202206100805
Unit Type and Rh: 7300
Unit Type and Rh: 7300

## 2020-12-13 LAB — COMPREHENSIVE METABOLIC PANEL
ALT: 7 U/L (ref 0–44)
AST: 13 U/L — ABNORMAL LOW (ref 15–41)
Albumin: 1.2 g/dL — ABNORMAL LOW (ref 3.5–5.0)
Alkaline Phosphatase: 84 U/L (ref 38–126)
Anion gap: 8 (ref 5–15)
BUN: 74 mg/dL — ABNORMAL HIGH (ref 6–20)
CO2: 25 mmol/L (ref 22–32)
Calcium: 7.4 mg/dL — ABNORMAL LOW (ref 8.9–10.3)
Chloride: 98 mmol/L (ref 98–111)
Creatinine, Ser: 3.24 mg/dL — ABNORMAL HIGH (ref 0.61–1.24)
GFR, Estimated: 23 mL/min — ABNORMAL LOW (ref >60)
Glucose, Bld: 263 mg/dL — ABNORMAL HIGH (ref 70–99)
Potassium: 4.4 mmol/L (ref 3.5–5.1)
Sodium: 131 mmol/L — ABNORMAL LOW (ref 135–145)
Total Bilirubin: 0.5 mg/dL (ref 0.3–1.2)
Total Protein: 6.4 g/dL — ABNORMAL LOW (ref 6.5–8.1)

## 2020-12-13 LAB — MAGNESIUM: Magnesium: 1.9 mg/dL (ref 1.7–2.4)

## 2020-12-13 LAB — PROCALCITONIN: Procalcitonin: 0.5 ng/mL

## 2020-12-13 LAB — BRAIN NATRIURETIC PEPTIDE: B Natriuretic Peptide: 975.1 pg/mL — ABNORMAL HIGH (ref 0.0–100.0)

## 2020-12-13 MED ORDER — FOLIC ACID 1 MG PO TABS
1.0000 mg | ORAL_TABLET | Freq: Every day | ORAL | Status: DC
  Administered 2020-12-13 – 2020-12-22 (×10): 1 mg via ORAL
  Filled 2020-12-13 (×10): qty 1

## 2020-12-13 MED ORDER — FERROUS SULFATE 325 (65 FE) MG PO TABS
325.0000 mg | ORAL_TABLET | Freq: Two times a day (BID) | ORAL | Status: DC
  Administered 2020-12-13 – 2020-12-15 (×6): 325 mg via ORAL
  Filled 2020-12-13 (×6): qty 1

## 2020-12-13 MED ORDER — LACTATED RINGERS IV SOLN: INTRAVENOUS | Status: DC

## 2020-12-13 NOTE — Progress Notes (Signed)
Patient ID: Todd Mccoy, male   DOB: [birth year], 50 y.o.   MRN: [id number] Patient is a 50 year old gentleman status post left transtibial amputation there is no drainage in the wound VAC canister.  Patient denies any symptoms at this time plan for physical therapy progressive ambulation anticipate discharge to home with home health therapy.

## 2020-12-13 NOTE — Progress Notes (Signed)
Inpatient Diabetes Program Recommendations  AACE/ADA: New Consensus Statement on Inpatient Glycemic Control   Target Ranges:  Prepandial:   less than 140 mg/dL      Peak postprandial:   less than 180 mg/dL (1-2 hours)      Critically ill patients:  140 - 180 mg/dL   Results for LUNSFORD, BENNER (MRN [id number]) as of 12/13/2020 08:17  Ref. Range 12/12/2020 07:43 12/12/2020 12:12 12/12/2020 17:07 12/12/2020 21:23 12/13/2020 07:39  Glucose-Capillary Latest Ref Range: 70 - 99 mg/dL 97 180 (H) 270 (H) 283 (H) 233 (H)    Review of Glycemic Control  Diabetes history: DM2 Outpatient Diabetes medications: Lantus 30 units QAM, Metformin 500 mg BID Current orders for Inpatient glycemic control: Lantus 15 units daily, Novolog 0-9 units TID with meals, Novolog 0-5 units QHS  Inpatient Diabetes Program Recommendations:    Insulin: Please consider increasing Lantus to 18 units daily and adding Novolog 4 units TID with meals for meal coverage if patient eats at least 50% of meals.  Thanks, Barnie Alderman, RN, MSN, CDE Diabetes Coordinator Inpatient Diabetes Program [phone] (Team Pager from 8am to 5pm)

## 2020-12-13 NOTE — Progress Notes (Signed)
PROGRESS NOTE                                                                                                                                                                                                             Patient Demographics:    Todd Mccoy, is a 50 y.o. male, DOB - 1970/10/10, [id number]  Outpatient Primary MD for the patient is [name], MD    LOS - 3  Admit date - 12/10/2020    CC - L.Foot infection     Brief Narrative (HPI from H&P) Todd Mccoy is a 50 y.o. 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   Subjective:   Patient in bed, appears comfortable, denies any headache, no fever, no chest pain or pressure, no shortness of breath , no abdominal pain. No new focal weakness.   Assessment  & Plan :     Recurrent left foot infection.  S/p BKA.  Defer management to primary team orthopedics, stump site under bandage.  Will stop antibiotics after today.  Case discussed with orthopedics bedside on 12/13/20.  2.  AKI on CKD stage IIIa.  Baseline creatinine around 1.7, he appears severely dehydrated, hydrate with IV fluids, avoid nephrotoxins, nonacute renal ultrasound UA suggestive of prerenal azotemia, continue with hydration and replacement of packed RBCs as appropriate, renal function has plateaued likely will take a few more days to improve.  3.  Dyslipidemia.  On statin continue.  4.  Tobacco abuse.  Counseled to quit.  5.  Perioperative blood loss related anemia.  Received 1 unit of packed RBC earlier this admission receiving second unit on 12/12/2020, no signs of ongoing bleeding, on twice daily PPI, will monitor stools.  6.  DM type II.  Currently on SSI,  increased Lantus dose.  Lab Results  Component Value Date   HGBA1C 8.6 (H) 11/12/2020    CBG (last 3)  Recent Labs    12/12/20 1707 12/12/20 2123 12/13/20 0739  GLUCAP 270* 283* 233*         Condition - Fair  Family Communication  : Per Primary team - Ortho  Code  Status :  Full  Consults  :  TRH consulting  PUD Prophylaxis :    Procedures  :     L BKA  Renal US - Non acute      Disposition Plan  :    Status is: Inpatient  Remains inpatient appropriate because:IV treatments appropriate due to intensity of illness or inability to take PO  Dispo: The patient is from: Home              Anticipated d/c is to: SNF              Patient currently is not medically stable to d/c.   Difficult to place patient No   DVT Prophylaxis  : Heparin added 12/11/2020.  heparin injection 5,000 Units Start: 12/11/20 1400 SCD's Start: 12/10/20 1712   Lab Results  Component Value Date   PLT 469 (H) 12/13/2020    Diet :  Diet Order             Diet Carb Modified Fluid consistency: Thin; Room service appropriate? Yes  Diet effective now                    Inpatient Medications  Scheduled Meds:  vitamin C  1,000 mg Oral Daily   atorvastatin  20 mg Oral Daily   docusate sodium  200 mg Oral BID   ferrous sulfate  325 mg Oral BID WC   folic acid  1 mg Oral Daily   gabapentin  300 mg Oral QHS   heparin injection (subcutaneous)  5,000 Units Subcutaneous Q8H   insulin aspart  0-5 Units Subcutaneous QHS   insulin aspart  0-9 Units Subcutaneous TID WC   insulin glargine  15 Units Subcutaneous Daily   multivitamin with minerals  1 tablet Oral Daily   nutrition supplement (JUVEN)  1 packet Oral BID BM   pantoprazole  40 mg Oral BID   polyethylene glycol  17 g Oral BID   Ensure Max Protein  11 oz Oral QHS   tamsulosin  0.4 mg Oral Daily   zinc sulfate  220 mg Oral Daily   Continuous Infusions:  lactated ringers     PRN Meds:.acetaminophen, alum & mag  hydroxide-simeth, bisacodyl, guaiFENesin-dextromethorphan, hydrALAZINE, HYDROmorphone (DILAUDID) injection, naphazoline-glycerin, ondansetron, oxyCODONE, phenol  Antibiotics  :    Anti-infectives (From admission, onward)    Start     Dose/Rate Route Frequency Ordered Stop   12/10/20 2000  cefTRIAXone (ROCEPHIN) 2 g in sodium chloride 0.9 % 100 mL IVPB       See Hyperspace for full Linked Orders Report.   2 g 200 mL/hr over 30 Minutes Intravenous Every 24 hours 12/10/20 1853 12/11/20 2359   12/10/20 2000  metroNIDAZOLE (FLAGYL) IVPB 500 mg       See Hyperspace for full Linked Orders Report.   500 mg 100 mL/hr over 60 Minutes Intravenous Every 8 hours 12/10/20 1853 12/11/20 2359   12/10/20 1715  ceFAZolin (ANCEF) IVPB 2g/100 mL premix  Status:  Discontinued        2 g 200 mL/hr over 30 Minutes Intravenous Every 8 hours 12/10/20 1711 12/10/20 1853   12/10/20 1245  vancomycin (VANCOREADY) IVPB 1000 mg/200 mL        1,000 mg 200 mL/hr over 60 Minutes Intravenous  Once 12/10/20 1241 12/10/20 1350   12/10/20 1135  vancomycin (VANCOCIN) 1-5 GM/200ML-% IVPB       Note to Pharmacy: Renda Rolls   : cabinet override  12/10/20 1135 12/10/20 1247   12/10/20 1130  ceFAZolin (ANCEF) IVPB 2g/100 mL premix        2 g 200 mL/hr over 30 Minutes Intravenous On call to O.R. 12/10/20 1122 12/10/20 1322        Time Spent in minutes  30   Lala Lund M.D on 12/13/2020 at 10:23 AM  To page go to [email or web address]   [facility] -  Office  [phone]   See all Orders from today for further details    Objective:   Vitals:   12/12/20 1217 12/12/20 1628 12/12/20 2019 12/13/20 0622  BP: (!) 154/97 (!) 152/88 (!) 150/86 137/75  Pulse: 93 96 98 90  Resp: '19 17 18 16  '$ Temp: 97.7 F (36.5 C) 97.8 F (36.6 C) 98 F (36.7 C) 98.5 F (36.9 C)  TempSrc: Oral Oral Oral Oral  SpO2: 99% 99% 100% 98%  Weight:      Height:        Wt Readings from Last 3 Encounters:  12/10/20 79.4 kg   11/12/20 89.8 kg  10/22/20 89.8 kg     Intake/Output Summary (Last 24 hours) at 12/13/2020 1023 Last data filed at 12/12/2020 2300 Gross per 24 hour  Intake 480 ml  Output 500 ml  Net -20 ml     Physical Exam  Awake Alert, No new F.N deficits, Normal affect .AT,PERRAL Supple Neck,No JVD, No cervical lymphadenopathy appriciated.  Symmetrical Chest wall movement, Good air movement bilaterally, CTAB RRR,No Gallops, Rubs or new Murmurs, No Parasternal Heave +ve B.Sounds, Abd Soft, No tenderness, No organomegaly appriciated, No rebound - guarding or rigidity. L. BKA    Data Review:    CBC Recent Labs  Lab 12/10/20 1125 12/11/20 0236 12/12/20 0150 12/12/20 1254 12/13/20 0057  WBC 35.8* 22.2* 19.5*  --  16.6*  HGB 7.8* 8.2* 7.1* 9.0* 7.6*  HCT 26.2* 25.9* 23.7* 28.9* 24.1*  PLT 608* 528* 559*  --  469*  MCV 80.6 78.0* 80.1  --  78.8*  MCH 24.0* 24.7* 24.0*  --  24.8*  MCHC 29.8* 31.7 30.0  --  31.5  RDW 20.3* 19.3* 19.6*  --  19.0*  LYMPHSABS  --   --  2.1  --  1.9  MONOABS  --   --  1.3*  --  1.2*  EOSABS  --   --  0.2  --  0.2  BASOSABS  --   --  0.0  --  0.0    Recent Labs  Lab 12/10/20 1901 12/11/20 0236 12/11/20 0721 12/12/20 0150 12/13/20 0057  NA  --  133*  --  132* 131*  K  --  3.6  --  4.0 4.4  CL  --  95*  --  95* 98  CO2  --  26  --  28 25  GLUCOSE  --  268*  --  120* 263*  BUN  --  51*  --  61* 74*  CREATININE  --  3.25*  --  3.36* 3.24*  CALCIUM  --  7.9*  --  7.7* 7.4*  AST  --   --   --  13* 13*  ALT  --   --   --  7 7  ALKPHOS  --   --   --  94 84  BILITOT  --   --   --  0.6 0.5  ALBUMIN  --   --   --  1.3* 1.2*  MG  --   --  1.6* 2.0 1.9  CRP 29.8*  --   --   --   --   PROCALCITON  --   --  0.83 0.71 0.50  BNP  --   --  1,024.0* 939.6* 975.1*    ------------------------------------------------------------------------------------------------------------------ No results for input(s): CHOL, HDL, LDLCALC, TRIG, CHOLHDL, LDLDIRECT  in the last 72 hours.  Lab Results  Component Value Date   HGBA1C 8.6 (H) 11/12/2020   ------------------------------------------------------------------------------------------------------------------ No results for input(s): TSH, T4TOTAL, T3FREE, THYROIDAB in the last 72 hours.  Invalid input(s): FREET3  Cardiac Enzymes No results for input(s): CKMB, TROPONINI, MYOGLOBIN in the last 168 hours.  Invalid input(s): CK ------------------------------------------------------------------------------------------------------------------    Component Value Date/Time   BNP 975.1 (H) 12/13/2020 0057    Micro Results Recent Results (from the past 240 hour(s))  SARS Coronavirus 2 by RT PCR (hospital order, performed in [facility] hospital lab) Nasopharyngeal Nasopharyngeal Swab     Status: None   Collection Time: 12/10/20 11:25 AM   Specimen: Nasopharyngeal Swab  Result Value Ref Range Status   SARS Coronavirus 2 NEGATIVE NEGATIVE Final    Comment: Performed at [facility], [place] [address], [place], [place] [address]  Culture, blood (routine x 2)     Status: None (Preliminary result)   Collection Time: 12/10/20  7:01 PM   Specimen: BLOOD  Result Value Ref Range Status   Specimen Description BLOOD SITE NOT SPECIFIED  Final   Special Requests   Final    BOTTLES DRAWN AEROBIC AND ANAEROBIC Blood Culture adequate volume   Culture   Final    NO GROWTH 3 DAYS Performed at [facility], [address] [place], [place] [address]    Report Status PENDING  Incomplete  Culture, blood (routine x 2)     Status: None (Preliminary result)   Collection Time: 12/10/20  7:01 PM   Specimen: BLOOD  Result Value Ref Range Status   Specimen Description BLOOD SITE NOT SPECIFIED  Final   Special Requests   Final    BOTTLES DRAWN AEROBIC ONLY Blood Culture results may not be optimal due to an inadequate volume of blood received in culture bottles   Culture   Final    NO GROWTH 3  DAYS Performed at [facility], [place] [address] [place], [place] [address]    Report Status PENDING  Incomplete    Radiology Reports US RENAL  Result Date: 12/12/2020 CLINICAL DATA:  Acute renal injury. EXAM: RENAL / URINARY TRACT ULTRASOUND COMPLETE COMPARISON:  CT abdomen pelvis 09/07/2014 FINDINGS: Right Kidney: Renal measurements: 12.6 x 5 x 6 cm = volume: 198 mL. Echogenicity within normal limits. No mass or hydronephrosis visualized. Left Kidney: Renal measurements: 12.8 x 5.3 x 5.4 cm = volume: 189 mL. Echogenicity within normal limits. No mass or hydronephrosis visualized. Urinary bladder: Thickened bladder wall. Otherwise appears normal for degree of bladder distention. Other: At least small volume ascites.  Right pleural effusion. IMPRESSION: 1. Thickened urinary bladder wall. Correlate with urinalysis for infection. 2. At least small volume ascites. 3. Right pleural effusion. Electronically Signed   By: Iven Finn M.D.   On: 12/12/2020 05:01   DG Chest Port 1 View  Result Date: 12/11/2020 CLINICAL DATA:  Shortness of breath. EXAM: PORTABLE CHEST 1 VIEW COMPARISON:  12/10/2020. FINDINGS: Cardiomegaly with mild pulmonary venous congestion. Low lung volumes with bibasilar atelectasis. Bibasilar infiltrates/edema cannot be excluded. Small right pleural effusion. No pneumothorax. IMPRESSION: Cardiomegaly with mild pulmonary venous congestion.  2. Low lung volumes with bibasilar atelectasis. Bibasilar infiltrates/edema cannot be excluded. Small right pleural effusion. Electronically Signed   By: Marcello Moores  Register   On: 12/11/2020 08:06   DG CHEST PORT 1 VIEW  Result Date: 12/10/2020 CLINICAL DATA:  Shortness of breath.  Preoperative study. EXAM: PORTABLE CHEST 1 VIEW COMPARISON:  Chest x-ray 09/06/2014. FINDINGS: Mediastinum and hilar structures normal. Cardiomegaly. Mild pulmonary venous congestion. Right base infiltrate/edema. Small right pleural effusion. IMPRESSION: 1.  Cardiomegaly.   Mild pulmonary venous congestion. 2.  Right base infiltrate/edema.  Small right pleural effusion. These results will be called to the ordering clinician or representative by the Radiologist Assistant, and communication documented in the PACS or [employer]. Electronically Signed   By: Marcello Moores  Register   On: 12/10/2020 12:01   XR Knee 1-2 Views Left  Result Date: 12/08/2020 2 view radiographs of the left knee shows progressive collapse of the medial tibial plateau fracture.  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  He also has some changes within the fourth metatarsal and concerns for further osteomyelitis

## 2020-12-13 NOTE — Progress Notes (Signed)
Physical Therapy Treatment Patient Details Name: Todd Mccoy MRN: [id number] DOB: 08-Nov-1970 Today's Date: 12/13/2020    History of Present Illness Pt is a 50 y/o male who presents 6/8   dehiscence of a recent left fifth ray amputation and new ulceration medial left foot.  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 Required up to min-modA for ambulation with RW due to LOB. Provided HEP handout, however patient reports anxiety upon returning to supine and requests to defer education. Left in room for family to look over and PT to instruct next session. Continue to recommend HHPT following discharge to maximize functional mobility and safety.     Follow Up Recommendations  Home health PT;Supervision for mobility/OOB     Equipment Recommendations  Wheelchair (measurements PT);Wheelchair cushion (measurements PT) (16 or 18" with removable armrests and elevating legrests)    Recommendations for Other Services       Precautions / Restrictions Precautions Precautions: Fall Required Braces or Orthoses: Other Brace Other Brace: limb protector Restrictions Weight Bearing Restrictions: Yes LLE Weight Bearing: Non weight bearing    Mobility  Bed Mobility Overal bed mobility: Modified Independent             General bed mobility comments: cues for direction but no assist needed    Transfers Overall transfer level: Needs assistance Equipment used: Rolling Lota Leamer (2 wheeled) Transfers: Sit to/from Stand Sit to Stand: Min guard         General transfer comment: min guard for safety. Good recall of hand placement  Ambulation/Gait Ambulation/Gait assistance: Min assist Gait Distance (Feet): 8 Feet Assistive device: Rolling Kayleena Eke (2 wheeled) Gait Pattern/deviations: Step-to pattern (hop to) Gait  velocity: decr   General Gait Details: Assist to stabilize and verbal cues for technique. LOB x 1 with min-modA to recover   Stairs             Wheelchair Mobility    Modified Rankin (Stroke Patients Only)       Balance Overall balance assessment: Needs assistance Sitting-balance support: No upper extremity supported;Feet supported Sitting balance-Leahy Scale: Good     Standing balance support: Bilateral upper extremity supported;During functional activity Standing balance-Leahy Scale: Poor Standing balance comment: reliant on UE support and at least min guard for standing                            Cognition Arousal/Alertness: Awake/alert Behavior During Therapy: Anxious Overall Cognitive Status: Within Functional Limits for tasks assessed                                 General Comments: anxious this session due to surprise session and not having eye drops in R eye to assist vision      Exercises      General Comments General comments (skin integrity, edema, etc.): brought HEP but patient overwhelmed after short ambulation and decreased vision due to no eye drops. Left in room for next session or family to look over.      Pertinent Vitals/Pain Pain Assessment: No/denies pain    Home Living  Prior Function            PT Goals (current goals can now be found in the care plan section) Acute Rehab PT Goals Patient Stated Goal: get a prosthetic PT Goal Formulation: With patient Time For Goal Achievement: 12/25/20 Potential to Achieve Goals: Good Progress towards PT goals: Progressing toward goals    Frequency    Min 4X/week      PT Plan Current plan remains appropriate    Co-evaluation              AM-PAC PT "6 Clicks" Mobility   Outcome Measure  Help needed turning from your back to your side while in a flat bed without using bedrails?: A Little Help needed moving from lying on  your back to sitting on the side of a flat bed without using bedrails?: A Little Help needed moving to and from a bed to a chair (including a wheelchair)?: A Little Help needed standing up from a chair using your arms (e.g., wheelchair or bedside chair)?: A Little Help needed to walk in hospital room?: A Little Help needed climbing 3-5 steps with a railing? : A Lot 6 Click Score: 17    End of Session Equipment Utilized During Treatment: Gait belt Activity Tolerance: Patient tolerated treatment well Patient left: in bed;with call bell/phone within reach;with bed alarm set Nurse Communication: Mobility status PT Visit Diagnosis: Other abnormalities of gait and mobility (R26.89);Muscle weakness (generalized) (M62.81);Difficulty in walking, not elsewhere classified (R26.2)     Time: [id number] PT Time Calculation (min) (ACUTE ONLY): 28 min  Charges:  $Gait Training: 23-37 mins                     Ryelan Kazee A. Gilford Rile PT, DPT Acute Rehabilitation Services Pager [phone] Office [phone]    Linna Hoff 12/13/2020, 5:48 PM

## 2020-12-14 ENCOUNTER — Inpatient Hospital Stay (HOSPITAL_COMMUNITY): Payer: Medicaid Other

## 2020-12-14 LAB — COMPREHENSIVE METABOLIC PANEL
ALT: 7 U/L (ref 0–44)
AST: 13 U/L — ABNORMAL LOW (ref 15–41)
Albumin: 1.3 g/dL — ABNORMAL LOW (ref 3.5–5.0)
Alkaline Phosphatase: 79 U/L (ref 38–126)
Anion gap: 7 (ref 5–15)
BUN: 82 mg/dL — ABNORMAL HIGH (ref 6–20)
CO2: 26 mmol/L (ref 22–32)
Calcium: 7.6 mg/dL — ABNORMAL LOW (ref 8.9–10.3)
Chloride: 95 mmol/L — ABNORMAL LOW (ref 98–111)
Creatinine, Ser: 3.1 mg/dL — ABNORMAL HIGH (ref 0.61–1.24)
GFR, Estimated: 24 mL/min — ABNORMAL LOW (ref >60)
Glucose, Bld: 235 mg/dL — ABNORMAL HIGH (ref 70–99)
Potassium: 4.6 mmol/L (ref 3.5–5.1)
Sodium: 128 mmol/L — ABNORMAL LOW (ref 135–145)
Total Bilirubin: 0.5 mg/dL (ref 0.3–1.2)
Total Protein: 6.5 g/dL (ref 6.5–8.1)

## 2020-12-14 LAB — CBC WITH DIFFERENTIAL/PLATELET
Abs Immature Granulocytes: 0.28 10*3/uL — ABNORMAL HIGH (ref 0.00–0.07)
Basophils Absolute: 0 10*3/uL (ref 0.0–0.1)
Basophils Relative: 0 %
Eosinophils Absolute: 0.2 10*3/uL (ref 0.0–0.5)
Eosinophils Relative: 1 %
HCT: 25.6 % — ABNORMAL LOW (ref 39.0–52.0)
Hemoglobin: 8 g/dL — ABNORMAL LOW (ref 13.0–17.0)
Immature Granulocytes: 2 %
Lymphocytes Relative: 14 %
Lymphs Abs: 2.1 10*3/uL (ref 0.7–4.0)
MCH: 24.8 pg — ABNORMAL LOW (ref 26.0–34.0)
MCHC: 31.3 g/dL (ref 30.0–36.0)
MCV: 79.3 fL — ABNORMAL LOW (ref 80.0–100.0)
Monocytes Absolute: 0.9 10*3/uL (ref 0.1–1.0)
Monocytes Relative: 6 %
Neutro Abs: 11.8 10*3/uL — ABNORMAL HIGH (ref 1.7–7.7)
Neutrophils Relative %: 77 %
Platelets: 544 10*3/uL — ABNORMAL HIGH (ref 150–400)
RBC: 3.23 MIL/uL — ABNORMAL LOW (ref 4.22–5.81)
RDW: 19.2 % — ABNORMAL HIGH (ref 11.5–15.5)
WBC: 15.3 10*3/uL — ABNORMAL HIGH (ref 4.0–10.5)
nRBC: 0 % (ref 0.0–0.2)

## 2020-12-14 LAB — MAGNESIUM: Magnesium: 2 mg/dL (ref 1.7–2.4)

## 2020-12-14 LAB — BRAIN NATRIURETIC PEPTIDE: B Natriuretic Peptide: 1374.8 pg/mL — ABNORMAL HIGH (ref 0.0–100.0)

## 2020-12-14 LAB — GLUCOSE, CAPILLARY
Glucose-Capillary: 201 mg/dL — ABNORMAL HIGH (ref 70–99)
Glucose-Capillary: 211 mg/dL — ABNORMAL HIGH (ref 70–99)
Glucose-Capillary: 227 mg/dL — ABNORMAL HIGH (ref 70–99)
Glucose-Capillary: 260 mg/dL — ABNORMAL HIGH (ref 70–99)
Glucose-Capillary: 243 mg/dL — ABNORMAL HIGH (ref 70–99)

## 2020-12-14 MED ORDER — CARVEDILOL 3.125 MG PO TABS
3.1250 mg | ORAL_TABLET | Freq: Two times a day (BID) | ORAL | Status: DC
  Administered 2020-12-14 – 2020-12-16 (×6): 3.125 mg via ORAL
  Filled 2020-12-14 (×6): qty 1

## 2020-12-14 MED ORDER — MAGNESIUM HYDROXIDE 400 MG/5ML PO SUSP
30.0000 mL | Freq: Two times a day (BID) | ORAL | Status: AC
  Administered 2020-12-14 (×2): 30 mL via ORAL
  Filled 2020-12-14 (×2): qty 30

## 2020-12-14 MED ORDER — BISACODYL 10 MG RE SUPP
10.0000 mg | Freq: Once | RECTAL | Status: AC
  Administered 2020-12-14: 10 mg via RECTAL
  Filled 2020-12-14: qty 1

## 2020-12-14 MED ORDER — CARVEDILOL 6.25 MG PO TABS: 6.2500 mg | ORAL_TABLET | Freq: Two times a day (BID) | ORAL | Status: DC

## 2020-12-14 MED ORDER — BOOST PLUS PO LIQD
237.0000 mL | Freq: Three times a day (TID) | ORAL | Status: DC
  Administered 2020-12-15 – 2020-12-17 (×7): 237 mL via ORAL
  Filled 2020-12-14 (×11): qty 237

## 2020-12-14 MED ORDER — HYDRALAZINE HCL 20 MG/ML IJ SOLN
10.0000 mg | INTRAMUSCULAR | Status: DC | PRN
  Administered 2020-12-17: 10 mg via INTRAVENOUS
  Filled 2020-12-14: qty 1

## 2020-12-14 MED ORDER — INSULIN GLARGINE 100 UNIT/ML ~~LOC~~ SOLN
25.0000 [IU] | Freq: Every day | SUBCUTANEOUS | Status: DC
  Administered 2020-12-14 – 2020-12-22 (×9): 25 [IU] via SUBCUTANEOUS
  Filled 2020-12-14 (×9): qty 0.25

## 2020-12-14 NOTE — Progress Notes (Signed)
Floor coverage progress note  Per RN, patient has not had a bowel movement since 6/7 despite being given Colace and MiraLAX.  His abdomen appears very distended.  He has complained of some abdominal discomfort and had nausea earlier.  He has not vomited.  In addition, patient has not voided and bladder scan revealed 441 cc.  -Stat KUB -Antiemetic as needed -In-N-Out cath and repeat bladder scans every 4 hours.  May need Foley if he continues to have urinary retention.

## 2020-12-14 NOTE — Progress Notes (Signed)
Subjective: 4 Days Post-Op Procedure(s) (LRB): LEFT BELOW KNEE AMPUTATION (Left) Patient reports pain as mild.    Objective: Vital signs in last 24 hours: Temp:  [97.7 F (36.5 C)-98 F (36.7 C)] 97.9 F (36.6 C) (06/12 0432) Pulse Rate:  [90-93] 93 (06/12 0432) Resp:  [16] 16 (06/12 0432) BP: (129-166)/(75-101) 166/101 (06/12 0432) SpO2:  [96 %-99 %] 96 % (06/12 0432)  Intake/Output from previous day: 06/11 0701 - 06/12 0700 In: 480 [P.O.:480] Out: 311 [Urine:311] Intake/Output this shift: No intake/output data recorded.  Recent Labs    12/12/20 0150 12/12/20 1254 12/13/20 0057 12/14/20 0104  HGB 7.1* 9.0* 7.6* 8.0*   Recent Labs    12/13/20 0057 12/14/20 0104  WBC 16.6* 15.3*  RBC 3.06* 3.23*  HCT 24.1* 25.6*  PLT 469* 544*   Recent Labs    12/13/20 0057 12/14/20 0104  NA 131* 128*  K 4.4 4.6  CL 98 95*  CO2 25 26  BUN 74* 82*  CREATININE 3.24* 3.10*  GLUCOSE 263* 235*  CALCIUM 7.4* 7.6*   No results for input(s): LABPT, INR in the last 72 hours.  LLE- wound vac in place and functioning.  There is no fluid in canister   Assessment/Plan: 4 Days Post-Op Procedure(s) (LRB): LEFT BELOW KNEE AMPUTATION (Left) Discharge home with home health once medically stable NWB LLE Continue with wound vac UP with PT Continue to follow hospitalist plan per multiple medical problems      Aundra Dubin 12/14/2020, 7:09 AM

## 2020-12-14 NOTE — Progress Notes (Signed)
MD notified that the patient had 441 cc on bladder scan and still has not had a BM since the 7th.  He has abdominal distention and discomfort. The patient was ambulated to the bedside commode with no results. I&O cath ordered with only 275 out.  Residual bladder scan showed 36 cc left in the bladder.  MD ordered portable KUB.  Will continue to monitor patient.

## 2020-12-15 ENCOUNTER — Inpatient Hospital Stay (HOSPITAL_COMMUNITY): Payer: Medicaid Other

## 2020-12-15 LAB — IRON AND TIBC
Iron: 23 ug/dL — ABNORMAL LOW (ref 45–182)
Saturation Ratios: 15 % — ABNORMAL LOW (ref 17.9–39.5)
TIBC: 150 ug/dL — ABNORMAL LOW (ref 250–450)
UIBC: 127 ug/dL

## 2020-12-15 LAB — CBC WITH DIFFERENTIAL/PLATELET
Abs Immature Granulocytes: 0.25 10*3/uL — ABNORMAL HIGH (ref 0.00–0.07)
Basophils Absolute: 0 10*3/uL (ref 0.0–0.1)
Basophils Relative: 0 %
Eosinophils Absolute: 0.2 10*3/uL (ref 0.0–0.5)
Eosinophils Relative: 1 %
HCT: 26.1 % — ABNORMAL LOW (ref 39.0–52.0)
Hemoglobin: 8.1 g/dL — ABNORMAL LOW (ref 13.0–17.0)
Immature Granulocytes: 2 %
Lymphocytes Relative: 12 %
Lymphs Abs: 1.7 10*3/uL (ref 0.7–4.0)
MCH: 24.9 pg — ABNORMAL LOW (ref 26.0–34.0)
MCHC: 31 g/dL (ref 30.0–36.0)
MCV: 80.3 fL (ref 80.0–100.0)
Monocytes Absolute: 0.8 10*3/uL (ref 0.1–1.0)
Monocytes Relative: 6 %
Neutro Abs: 11.3 10*3/uL — ABNORMAL HIGH (ref 1.7–7.7)
Neutrophils Relative %: 79 %
Platelets: 521 10*3/uL — ABNORMAL HIGH (ref 150–400)
RBC: 3.25 MIL/uL — ABNORMAL LOW (ref 4.22–5.81)
RDW: 19.1 % — ABNORMAL HIGH (ref 11.5–15.5)
WBC: 14.2 10*3/uL — ABNORMAL HIGH (ref 4.0–10.5)
nRBC: 0 % (ref 0.0–0.2)

## 2020-12-15 LAB — COMPREHENSIVE METABOLIC PANEL
ALT: 8 U/L (ref 0–44)
AST: 14 U/L — ABNORMAL LOW (ref 15–41)
Albumin: 1.3 g/dL — ABNORMAL LOW (ref 3.5–5.0)
Alkaline Phosphatase: 80 U/L (ref 38–126)
Anion gap: 6 (ref 5–15)
BUN: 96 mg/dL — ABNORMAL HIGH (ref 6–20)
CO2: 27 mmol/L (ref 22–32)
Calcium: 7.8 mg/dL — ABNORMAL LOW (ref 8.9–10.3)
Chloride: 96 mmol/L — ABNORMAL LOW (ref 98–111)
Creatinine, Ser: 3.29 mg/dL — ABNORMAL HIGH (ref 0.61–1.24)
GFR, Estimated: 22 mL/min — ABNORMAL LOW (ref >60)
Glucose, Bld: 236 mg/dL — ABNORMAL HIGH (ref 70–99)
Potassium: 4.9 mmol/L (ref 3.5–5.1)
Sodium: 129 mmol/L — ABNORMAL LOW (ref 135–145)
Total Bilirubin: 0.4 mg/dL (ref 0.3–1.2)
Total Protein: 6.7 g/dL (ref 6.5–8.1)

## 2020-12-15 LAB — CULTURE, BLOOD (ROUTINE X 2)
Culture: NO GROWTH
Culture: NO GROWTH
Special Requests: ADEQUATE

## 2020-12-15 LAB — URINALYSIS, ROUTINE W REFLEX MICROSCOPIC
Bilirubin Urine: NEGATIVE
Glucose, UA: NEGATIVE mg/dL
Ketones, ur: NEGATIVE mg/dL
Nitrite: NEGATIVE
Protein, ur: 100 mg/dL — AB
Specific Gravity, Urine: 1.015 (ref 1.005–1.030)
pH: 5.5 (ref 5.0–8.0)

## 2020-12-15 LAB — URINALYSIS, MICROSCOPIC (REFLEX): Squamous Epithelial / HPF: NONE SEEN (ref 0–5)

## 2020-12-15 LAB — GLUCOSE, CAPILLARY
Glucose-Capillary: 210 mg/dL — ABNORMAL HIGH (ref 70–99)
Glucose-Capillary: 203 mg/dL — ABNORMAL HIGH (ref 70–99)
Glucose-Capillary: 181 mg/dL — ABNORMAL HIGH (ref 70–99)
Glucose-Capillary: 202 mg/dL — ABNORMAL HIGH (ref 70–99)
Glucose-Capillary: 260 mg/dL — ABNORMAL HIGH (ref 70–99)

## 2020-12-15 LAB — OSMOLALITY: Osmolality: 321 mOsm/kg (ref 275–295)

## 2020-12-15 LAB — SURGICAL PATHOLOGY

## 2020-12-15 LAB — OSMOLALITY, URINE: Osmolality, Ur: 352 mOsm/kg (ref 300–900)

## 2020-12-15 LAB — SODIUM, URINE, RANDOM: Sodium, Ur: 15 mmol/L

## 2020-12-15 LAB — BRAIN NATRIURETIC PEPTIDE: B Natriuretic Peptide: 1520.6 pg/mL — ABNORMAL HIGH (ref 0.0–100.0)

## 2020-12-15 LAB — FERRITIN: Ferritin: 161 ng/mL (ref 24–336)

## 2020-12-15 LAB — RETICULOCYTES
Immature Retic Fract: 18.2 % — ABNORMAL HIGH (ref 2.3–15.9)
RBC.: 3.19 MIL/uL — ABNORMAL LOW (ref 4.22–5.81)
Retic Count, Absolute: 49.1 10*3/uL (ref 19.0–186.0)
Retic Ct Pct: 1.5 % (ref 0.4–3.1)

## 2020-12-15 LAB — VITAMIN B12: Vitamin B-12: 927 pg/mL — ABNORMAL HIGH (ref 180–914)

## 2020-12-15 LAB — FOLATE: Folate: 15.2 ng/mL (ref >5.9)

## 2020-12-15 LAB — CREATININE, URINE, RANDOM: Creatinine, Urine: 116.74 mg/dL

## 2020-12-15 LAB — URIC ACID: Uric Acid, Serum: 9.1 mg/dL — ABNORMAL HIGH (ref 3.7–8.6)

## 2020-12-15 MED ORDER — MAGNESIUM HYDROXIDE 400 MG/5ML PO SUSP
30.0000 mL | Freq: Two times a day (BID) | ORAL | Status: AC
  Administered 2020-12-15 (×2): 30 mL via ORAL
  Filled 2020-12-15 (×2): qty 30

## 2020-12-15 MED ORDER — LACTATED RINGERS IV SOLN: INTRAVENOUS | Status: DC

## 2020-12-15 MED ORDER — NALOXEGOL OXALATE 25 MG PO TABS
25.0000 mg | ORAL_TABLET | Freq: Every day | ORAL | Status: AC
  Administered 2020-12-15 – 2020-12-16 (×2): 25 mg via ORAL
  Filled 2020-12-15 (×2): qty 1

## 2020-12-15 MED ORDER — PEG 3350-KCL-NA BICARB-NACL 420 G PO SOLR
2000.0000 mL | Freq: Once | ORAL | Status: AC
  Administered 2020-12-15: 2000 mL via ORAL
  Filled 2020-12-15: qty 4000

## 2020-12-15 NOTE — Progress Notes (Signed)
PROGRESS NOTE                                                                                                                                                                                                             Patient Demographics:    Todd Mccoy, is a 50 y.o. male, DOB - Dec 30, 1970, [id number]  Outpatient Primary MD for the patient is [name], MD    LOS - 5  Admit date - 12/10/2020    CC - L.Foot infection     Brief Narrative (HPI from H&P) Todd Mccoy is a 50 y.o. 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   Subjective:   Patient in bed, appears comfortable, denies any headache, no fever, no chest pain or pressure, no shortness of breath , no abdominal pain. No new focal weakness.    Assessment  & Plan :     Recurrent left foot infection.  S/p BKA.  Defer management to primary team orthopedics, stump site under bandage. ABX stopped.  Case discussed with orthopedics bedside on 12/13/20.  2.  AKI on CKD stage IIIa.  Baseline creatinine around 1.7, he appears severely dehydrated, hydrate with IV fluids, avoid nephrotoxins, nonacute renal ultrasound UA suggestive of prerenal azotemia, continue with hydration and replacement of packed RBCs as appropriate, renal function has plateaued likely will take a few more days to improve.  3.  Dyslipidemia.  On statin continue.  4.  Tobacco abuse.  Counseled to quit.  5.  Perioperative blood loss related anemia.  Received 1 unit of packed RBC earlier this admission receiving second unit on 12/12/2020, no signs of ongoing bleeding, on twice daily PPI, will monitor stools.  6. Constipation - on aggressive B. Regimen, enema PRN.  7.  DM type II.  Currently on SSI, increased Lantus dose.  Lab Results  Component Value Date   HGBA1C 8.6 (H) 11/12/2020    CBG (last 3)  Recent Labs    12/14/20 2236 12/15/20 0427 12/15/20 0838  GLUCAP 243* 210* 203*         Condition - Fair  Family Communication  : Per  Primary team - Ortho  Code Status :  Full  Consults  :  TRH consulting  PUD Prophylaxis :    Procedures  :     L BKA  Renal US - Non acute      Disposition Plan  :    Status is: Inpatient  Remains inpatient appropriate because:IV treatments appropriate due to intensity of illness or inability to take PO  Dispo: The patient is from: Home              Anticipated d/c is to: SNF              Patient currently is not medically stable to d/c.   Difficult to place patient No   DVT Prophylaxis  : Heparin added 12/11/2020.  heparin injection 5,000 Units Start: 12/11/20 1400 SCD's Start: 12/10/20 1712   Lab Results  Component Value Date   PLT 521 (H) 12/15/2020    Diet :  Diet Order             Diet Carb Modified Fluid consistency: Thin; Room service appropriate? Yes  Diet effective now                    Inpatient Medications  Scheduled Meds:  vitamin C  1,000 mg Oral Daily   atorvastatin  20 mg Oral Daily   carvedilol  3.125 mg Oral BID WC   docusate sodium  200 mg Oral BID   ferrous sulfate  325 mg Oral BID WC   folic acid  1 mg Oral Daily   gabapentin  300 mg Oral QHS   heparin injection (subcutaneous)  5,000 Units Subcutaneous Q8H   insulin aspart  0-5 Units Subcutaneous QHS   insulin aspart  0-9 Units Subcutaneous TID WC   insulin glargine  25 Units Subcutaneous Daily   lactose free nutrition  237 mL Oral TID WC   magnesium hydroxide  30 mL Oral BID   multivitamin with minerals  1 tablet Oral Daily   naloxegol oxalate  25 mg Oral Daily   nutrition supplement (JUVEN)  1 packet Oral BID BM   pantoprazole  40 mg Oral BID   polyethylene glycol-electrolytes  2,000 mL Oral  Once   Ensure Max Protein  11 oz Oral QHS   tamsulosin  0.4 mg Oral Daily   zinc sulfate  220 mg Oral Daily   Continuous Infusions:  lactated ringers 100 mL/hr at 12/15/20 0856   PRN Meds:.acetaminophen, alum & mag hydroxide-simeth, guaiFENesin-dextromethorphan, hydrALAZINE, naphazoline-glycerin, ondansetron, oxyCODONE, phenol  Antibiotics  :    Anti-infectives (From admission, onward)    Start     Dose/Rate Route Frequency Ordered Stop   12/10/20 2000  cefTRIAXone (ROCEPHIN) 2 g in sodium chloride 0.9 % 100 mL IVPB       See Hyperspace for full Linked Orders Report.   2 g 200 mL/hr over 30 Minutes Intravenous Every 24 hours 12/10/20 1853 12/11/20 2359   12/10/20 2000  metroNIDAZOLE (FLAGYL) IVPB 500 mg       See Hyperspace for full Linked Orders Report.   500 mg 100 mL/hr over 60 Minutes Intravenous Every 8 hours 12/10/20 1853 12/11/20 2359   12/10/20 1715  ceFAZolin (ANCEF) IVPB 2g/100 mL premix  Status:  Discontinued        2 g 200 mL/hr over 30 Minutes Intravenous Every 8 hours 12/10/20 1711 12/10/20 1853   12/10/20 1245  vancomycin (VANCOREADY) IVPB 1000 mg/200 mL  1,000 mg 200 mL/hr over 60 Minutes Intravenous  Once 12/10/20 1241 12/10/20 1350   12/10/20 1135  vancomycin (VANCOCIN) 1-5 GM/200ML-% IVPB       Note to Pharmacy: Renda Rolls   : cabinet override      12/10/20 1135 12/10/20 1247   12/10/20 1130  ceFAZolin (ANCEF) IVPB 2g/100 mL premix        2 g 200 mL/hr over 30 Minutes Intravenous On call to O.R. 12/10/20 1122 12/10/20 1322        Time Spent in minutes  30   Lala Lund M.D on 12/15/2020 at 11:12 AM  To page go to [email or web address]   [facility] -  Office  [phone]   See all Orders from today for further details    Objective:   Vitals:   12/14/20 0432 12/14/20 1333 12/14/20 2250 12/15/20 0429  BP: (!) 166/101 129/72 (!) 143/82 (!) 154/92  Pulse: 93 88 86 91  Resp: '16 15 16 16  '$ Temp: 97.9 F (36.6 C) 98 F (36.7 C) 98.5  F (36.9 C) 98.4 F (36.9 C)  TempSrc: Oral Oral Oral Oral  SpO2: 96% 99% 99% 100%  Weight:      Height:        Wt Readings from Last 3 Encounters:  12/10/20 79.4 kg  11/12/20 89.8 kg  10/22/20 89.8 kg     Intake/Output Summary (Last 24 hours) at 12/15/2020 1112 Last data filed at 12/15/2020 0600 Gross per 24 hour  Intake 1280 ml  Output 756 ml  Net 524 ml     Physical Exam  Awake Alert, No new F.N deficits, legally blind [place].AT,PERRAL Supple Neck,No JVD, No cervical lymphadenopathy appriciated.  Symmetrical Chest wall movement, Good air movement bilaterally, CTAB RRR,No Gallops, Rubs or new Murmurs, No Parasternal Heave +ve B.Sounds, Abd Soft, No tenderness, No organomegaly appriciated, No rebound - guarding or rigidity. L. BKA    Data Review:    CBC Recent Labs  Lab 12/11/20 0236 12/12/20 0150 12/12/20 1254 12/13/20 0057 12/14/20 0104 12/15/20 0204  WBC 22.2* 19.5*  --  16.6* 15.3* 14.2*  HGB 8.2* 7.1* 9.0* 7.6* 8.0* 8.1*  HCT 25.9* 23.7* 28.9* 24.1* 25.6* 26.1*  PLT 528* 559*  --  469* 544* 521*  MCV 78.0* 80.1  --  78.8* 79.3* 80.3  MCH 24.7* 24.0*  --  24.8* 24.8* 24.9*  MCHC 31.7 30.0  --  31.5 31.3 31.0  RDW 19.3* 19.6*  --  19.0* 19.2* 19.1*  LYMPHSABS  --  2.1  --  1.9 2.1 1.7  MONOABS  --  1.3*  --  1.2* 0.9 0.8  EOSABS  --  0.2  --  0.2 0.2 0.2  BASOSABS  --  0.0  --  0.0 0.0 0.0    Recent Labs  Lab 12/10/20 1901 12/11/20 0236 12/11/20 0721 12/12/20 0150 12/13/20 0057 12/14/20 0104 12/15/20 0204  NA  --  133*  --  132* 131* 128* 129*  K  --  3.6  --  4.0 4.4 4.6 4.9  CL  --  95*  --  95* 98 95* 96*  CO2  --  26  --  '28 25 26 27  '$ GLUCOSE  --  268*  --  120* 263* 235* 236*  BUN  --  51*  --  61* 74* 82* 96*  CREATININE  --  3.25*  --  3.36* 3.24* 3.10* 3.29*  CALCIUM  --  7.9*  --  7.7* 7.4* 7.6* 7.8*  AST  --   --   --  13* 13* 13* 14*  ALT  --   --   --  '7 7 7 8  '$ ALKPHOS  --   --   --  94 84 79 80  BILITOT  --   --   --  0.6  0.5 0.5 0.4  ALBUMIN  --   --   --  1.3* 1.2* 1.3* 1.3*  MG  --   --  1.6* 2.0 1.9 2.0  --   CRP 29.8*  --   --   --   --   --   --   PROCALCITON  --   --  0.83 0.71 0.50  --   --   BNP  --   --  1,024.0* 939.6* 975.1* 1,374.8* 1,520.6*    ------------------------------------------------------------------------------------------------------------------ No results for input(s): CHOL, HDL, LDLCALC, TRIG, CHOLHDL, LDLDIRECT in the last 72 hours.  Lab Results  Component Value Date   HGBA1C 8.6 (H) 11/12/2020   ------------------------------------------------------------------------------------------------------------------ No results for input(s): TSH, T4TOTAL, T3FREE, THYROIDAB in the last 72 hours.  Invalid input(s): FREET3  Cardiac Enzymes No results for input(s): CKMB, TROPONINI, MYOGLOBIN in the last 168 hours.  Invalid input(s): CK ------------------------------------------------------------------------------------------------------------------    Component Value Date/Time   BNP 1,520.6 (H) 12/15/2020 [id number]    Micro Results Recent Results (from the past 240 hour(s))  SARS Coronavirus 2 by RT PCR (hospital order, performed in [facility] hospital lab) Nasopharyngeal Nasopharyngeal Swab     Status: None   Collection Time: 12/10/20 11:25 AM   Specimen: Nasopharyngeal Swab  Result Value Ref Range Status   SARS Coronavirus 2 NEGATIVE NEGATIVE Final    Comment: Performed at [facility], [place] [address], [place], [place] [address]  Culture, blood (routine x 2)     Status: None   Collection Time: 12/10/20  7:01 PM   Specimen: BLOOD  Result Value Ref Range Status   Specimen Description BLOOD SITE NOT SPECIFIED  Final   Special Requests   Final    BOTTLES DRAWN AEROBIC AND ANAEROBIC Blood Culture adequate volume   Culture   Final    NO GROWTH 5 DAYS Performed at [facility], [place] [address], [place], [place] [address]    Report Status 12/15/2020 FINAL  Final   Culture, blood (routine x 2)     Status: None   Collection Time: 12/10/20  7:01 PM   Specimen: BLOOD  Result Value Ref Range Status   Specimen Description BLOOD SITE NOT SPECIFIED  Final   Special Requests   Final    BOTTLES DRAWN AEROBIC ONLY Blood Culture results may not be optimal due to an inadequate volume of blood received in culture bottles   Culture   Final    NO GROWTH 5 DAYS Performed at [facility], [place] [address], [place], [place] [address]    Report Status 12/15/2020 FINAL  Final    Radiology Reports US RENAL  Result Date: 12/12/2020 CLINICAL DATA:  Acute renal injury. EXAM: RENAL / URINARY TRACT ULTRASOUND COMPLETE COMPARISON:  CT abdomen pelvis 09/07/2014 FINDINGS: Right Kidney: Renal measurements: 12.6 x 5 x 6 cm = volume: 198 mL. Echogenicity within normal limits. No mass or hydronephrosis visualized. Left Kidney: Renal measurements: 12.8 x 5.3 x 5.4 cm = volume: 189 mL. Echogenicity within normal limits. No mass or hydronephrosis visualized. Urinary bladder: Thickened bladder wall. Otherwise appears normal for degree of bladder distention. Other: At least  small volume ascites.  Right pleural effusion. IMPRESSION: 1. Thickened urinary bladder wall. Correlate with urinalysis for infection. 2. At least small volume ascites. 3. Right pleural effusion. Electronically Signed   By: Iven Finn M.D.   On: 12/12/2020 05:01   DG Chest Port 1 View  Result Date: 12/15/2020 CLINICAL DATA:  Shortness of breath EXAM: PORTABLE CHEST 1 VIEW COMPARISON:  Renal ultrasound 12/11/2020 FINDINGS: Enlargement of the cardiopericardial silhouette. Fill like opacity in the right lung base with significant obscuration of the right cardiac margin. Mild patchy airspace opacity at the left lung base and left retrocardiac region as well. No pneumothorax. Mild vascular congestion without overt edema. No acute osseous abnormality. IMPRESSION: 1. Cardiomegaly and pulmonary vascular congestion  without overt edema. 2. Right basilar airspace opacity most consistent with a moderate layering right pleural effusion and associated right middle and lower lobe atelectasis versus infiltrate. 3. Nonspecific patchy airspace opacity in the left base also likely reflects atelectasis and/or infiltrate. Electronically Signed   By: Jacqulynn Cadet M.D.   On: 12/15/2020 07:51   DG Chest Port 1 View  Result Date: 12/11/2020 CLINICAL DATA:  Shortness of breath. EXAM: PORTABLE CHEST 1 VIEW COMPARISON:  12/10/2020. FINDINGS: Cardiomegaly with mild pulmonary venous congestion. Low lung volumes with bibasilar atelectasis. Bibasilar infiltrates/edema cannot be excluded. Small right pleural effusion. No pneumothorax. IMPRESSION: Cardiomegaly with mild pulmonary venous congestion. 2. Low lung volumes with bibasilar atelectasis. Bibasilar infiltrates/edema cannot be excluded. Small right pleural effusion. Electronically Signed   By: Marcello Moores  Register   On: 12/11/2020 08:06   DG CHEST PORT 1 VIEW  Result Date: 12/10/2020 CLINICAL DATA:  Shortness of breath.  Preoperative study. EXAM: PORTABLE CHEST 1 VIEW COMPARISON:  Chest x-ray 09/06/2014. FINDINGS: Mediastinum and hilar structures normal. Cardiomegaly. Mild pulmonary venous congestion. Right base infiltrate/edema. Small right pleural effusion. IMPRESSION: 1.  Cardiomegaly.  Mild pulmonary venous congestion. 2.  Right base infiltrate/edema.  Small right pleural effusion. These results will be called to the ordering clinician or representative by the Radiologist Assistant, and communication documented in the PACS or [employer]. Electronically Signed   By: Marcello Moores  Register   On: 12/10/2020 12:01   DG Abd Portable 1V  Result Date: 12/14/2020 CLINICAL DATA:  Abdominal distension. EXAM: PORTABLE ABDOMEN - 1 VIEW COMPARISON:  None. FINDINGS: Or imaging quality due to supine film and patient body habitus. Moderate colonic stool burden. No high-grade obstructive bowel gas  pattern. No visible suspicious abdominal calcifications. No acute or worrisome osseous abnormalities. IMPRESSION: Suboptimal imaging quality due to portable supine film and patient body habitus. Moderate stool burden. No convincing high-grade obstruction or other acute abdominal radiographic abnormality Electronically Signed   By: Lovena Le M.D.   On: 12/14/2020 05:57   XR Knee 1-2 Views Left  Result Date: 12/08/2020 2 view radiographs of the left knee shows progressive collapse of the medial tibial plateau fracture.  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  He also has some changes within the fourth metatarsal and concerns for further osteomyelitis

## 2020-12-15 NOTE — Progress Notes (Signed)
Patient ID: Todd Mccoy, male   DOB: 12-04-1970, 50 y.o.   MRN: [id number] Patient is postoperative day 5 left below-knee amputation.  Patient is making progress with therapy.  Anticipate patient can be discharged in a day or 2 pending recommendations from physical therapy.

## 2020-12-15 NOTE — Progress Notes (Signed)
Physical Therapy Treatment Patient Details Name: Todd Mccoy MRN: [id number] DOB: 1971/03/29 Today's Date: 12/15/2020    History of Present Illness Pt is a 50 y/o male who presents 6/8   dehiscence of a recent left fifth ray amputation and new ulceration medial left foot.  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 Discussed healing time for incision, general importance of residual limb health prior to getting a prosthetic, and general management strategies of phantom pain. He politely declines gait or transfer practice today due to having an upset stomach/needing to spend more time on BSC due to bowel movements. Unable to find HEP in room so sent exercises (quad sets, single leg bridges, supine hip extension, supine hip ABD) via medbridge. Left on [facility] with NT aware and call bell in reach. Will plan to attempt progressing gait distance next date of service.    Follow Up Recommendations  Home health PT;Supervision for mobility/OOB     Equipment Recommendations  Wheelchair (measurements PT);Wheelchair cushion (measurements PT) (18" with removable arm rests and elevating leg rests)    Recommendations for Other Services       Precautions / Restrictions Precautions Precautions: Fall Precaution Comments: L BKA, wound vac Required Braces or Orthoses: Other Brace Other Brace: limb protector Restrictions Weight Bearing Restrictions: Yes LLE Weight Bearing: Non weight bearing    Mobility  Bed Mobility               General bed mobility comments: on BSC    Transfers                 General transfer comment: declined- bowels upset  Ambulation/Gait             General Gait Details: declined- bowels upset   Stairs             Wheelchair Mobility    Modified Rankin (Stroke  Patients Only)       Balance Overall balance assessment: Needs assistance Sitting-balance support: No upper extremity supported;Feet supported Sitting balance-Leahy Scale: Good                                      Cognition Arousal/Alertness: Awake/alert Behavior During Therapy: Flat affect;Anxious Overall Cognitive Status: Within Functional Limits for tasks assessed                                 General Comments: pleasant but anxious about stomach cramps/stomach upset      Exercises      General Comments        Pertinent Vitals/Pain Pain Assessment: No/denies pain Faces Pain Scale: No hurt Pain Intervention(s): Limited activity within patient's tolerance;Monitored during session    Home Living                      Prior Function            PT Goals (current goals can now be found in the care plan section) Acute Rehab PT Goals Patient Stated Goal: get a prosthetic PT Goal Formulation: With patient Time For Goal Achievement: 12/25/20 Potential to Achieve Goals: Good Additional Goals Additional Goal #1: pt  will demonstrate ability to mobilize in a w/c with UE's and or R LE in home-like environment  (if pt is to go directly home) Progress towards PT goals: Not progressing toward goals - comment (limited by bowel upset/bowel discomfort today)    Frequency    Min 4X/week      PT Plan Current plan remains appropriate    Co-evaluation              AM-PAC PT "6 Clicks" Mobility   Outcome Measure  Help needed turning from your back to your side while in a flat bed without using bedrails?: A Little Help needed moving from lying on your back to sitting on the side of a flat bed without using bedrails?: A Little Help needed moving to and from a bed to a chair (including a wheelchair)?: A Little Help needed standing up from a chair using your arms (e.g., wheelchair or bedside chair)?: A Little Help needed to walk in  hospital room?: A Little Help needed climbing 3-5 steps with a railing? : A Lot 6 Click Score: 17    End of Session   Activity Tolerance: Other (comment) (limited by stomach upset/bowel discomfort) Patient left: Other (comment);with call bell/phone within reach (on [facility], NT aware) Nurse Communication: Mobility status PT Visit Diagnosis: Other abnormalities of gait and mobility (R26.89);Muscle weakness (generalized) (M62.81);Difficulty in walking, not elsewhere classified (R26.2)     Time: [id number] PT Time Calculation (min) (ACUTE ONLY): 13 min  Charges:  $Self Care/Home Management: 8-22                    Windell Norfolk, DPT, PN1   Supplemental Physical Therapist [place]    Pager [phone] Acute Rehab Office [phone]

## 2020-12-15 NOTE — Progress Notes (Signed)
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  Pt continues to present with visual deficits, impaired safety awareness and impaired balance. Pt currently requires supervision for bed mobility and MIN A for ADL transfers with RW.  Pt requires max cues for safety awareness and spatial awareness during mobility tasks during all mobility. Pt noted to knock over water on tray table with no awareness and requires MAX tactile cues for hand placement and DME placement during transfer. Pt reports declining CIR d/t financial cost, feel pt would greatly benefit from CIR d/t below listed deficits however if pt declines CIR pt would need HHOT and below listed DME, already began discussion about available shower seats and provided handouts for pt to give his family. Pt reports he thinks he already has tub transfer bench at home but is unsure. Will continue to follow acutely per POC.    Follow Up Recommendations  CIR;Supervision/Assistance - 24 hour;Home health OT    Equipment Recommendations  3 in 1 bedside commode;Other (comment);Tub/shower bench (drop arm 3n1)    Recommendations for Other Services      Precautions / Restrictions Precautions Precautions: Fall Precaution Comments: L BKA, wound vac Required Braces or Orthoses: Other Brace Other Brace: limb protector Restrictions Weight Bearing Restrictions: Yes LLE Weight Bearing: Non weight bearing       Mobility Bed Mobility Overal bed mobility: Modified Independent Bed Mobility: Supine to Sit     Supine to sit: Supervision     General bed  mobility comments: no physcial assist needed, cues for sequence and body awareness d/t visual deficits    Transfers Overall transfer level: Needs assistance Equipment used: Rolling walker (2 wheeled) Transfers: Sit to/from [facility] Sit to Stand: Min guard Stand pivot transfers: Min assist       General transfer comment: min guard to rise from EOB cues for hand placement and initial light steadying assist, min A to pivot to pts R side to BSC, MAX cues needed for body awareness and spatial awareness during functional mobility    Balance Overall balance assessment: Needs assistance Sitting-balance support: No upper extremity supported;Feet supported Sitting balance-Leahy Scale: Good     Standing balance support: Bilateral upper extremity supported;During functional activity Standing balance-Leahy Scale: Poor Standing balance comment: reliant on UE support and at least min guard for standing                           ADL either performed or assessed with clinical judgement   ADL Overall ADL's : Needs assistance/impaired                         Toilet Transfer: Minimal assistance;RW;Stand-pivot;BSC;Cueing for safety;Cueing for sequencing Toilet Transfer Details (indicate cue type and reason): stand pivot to [facility] to pts R side with RW, max directional cues for safety d/t visual deficits         Functional mobility during ADLs: Minimal assistance;Cueing for sequencing;Cueing for safety;Rolling walker General ADL Comments: pt limited by visual deficits, impaired safety awareness  and impaired balance     Vision Baseline Vision/History: Wears glasses Wears Glasses: At all times Patient Visual Report: Central vision impairment;Other (comment) (L eye blind; 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?   Perception     Praxis      Cognition Arousal/Alertness: Awake/alert Behavior During Therapy: Flat affect Overall Cognitive Status: Within Functional Limits for tasks assessed                                 General Comments: overall WFL but did not formally assess cog        Exercises     Shoulder Instructions       General Comments vss on RA,    Pertinent Vitals/ Pain       Pain Assessment: No/denies pain Faces Pain Scale: No hurt Pain Intervention(s): Limited activity within patient's tolerance;Monitored during session  Home Living                                          Prior Functioning/Environment              Frequency  Min 2X/week        Progress Toward Goals  OT Goals(current goals can now be found in the care plan section)  Progress towards OT goals: Progressing toward goals  Acute Rehab OT Goals Patient Stated Goal: to go to bathroom OT Goal Formulation: With patient Time For Goal Achievement: 12/25/20 Potential to Achieve Goals: [place] Discharge plan needs to be updated;Equipment recommendations need to be updated    Co-evaluation                 AM-PAC OT "6 Clicks" Daily Activity     Outcome Measure   Help from another person eating meals?: None Help from another person taking care of personal grooming?: A Little Help from another person toileting, which includes using toliet, bedpan, or urinal?: A Little Help from another person bathing (including washing, rinsing, drying)?: A Little Help from another person to put on and taking off regular upper body clothing?: None Help from another person to put on and taking off regular lower body clothing?: A Lot 6 Click Score: 19    End of Session Equipment Utilized During Treatment: Gait belt;Rolling walker  OT Visit Diagnosis: Unsteadiness on feet  (R26.81);Other abnormalities of gait and mobility (R26.89);Low vision, both eyes (H54.2)   Activity Tolerance Patient tolerated treatment well   Patient Left Other (comment) (on [facility] with call bell within reach, RN aware)   Nurse Communication Mobility status;Other (comment) (on [facility])        Time: [id number] OT Time Calculation (min): 21 min  Charges: OT General Charges $OT Visit: 1 Visit OT Treatments $Self Care/Home Management : 8-22 mins Harley Alto., COTA/L Acute Rehabilitation Services [place] 12/15/2020, 3:59 PM

## 2020-12-15 NOTE — Progress Notes (Signed)
Date and time results received: 12/15/20 0958 (use smartphrase ".now" to insert current time)  Test: osmolality  Critical Value: 321  Name of Provider Notified: Dr. Candiss Norse  Orders Received? Or Actions Taken?:No new orders at this time

## 2020-12-15 NOTE — TOC Progression Note (Addendum)
Transition of Care [facility]) - Progression Note    Patient Details  Name: Todd Mccoy MRN: [id number] Date of Birth: [birth year]  Transition of [facility]) CM/SW Contact  Loletha Grayer Beverely Pace, RN Phone Number: 12/15/2020, 10:27 AM  Clinical Narrative:   Case manager contacted [name], Regional Director of Sales with [facility] in regards to referral for charity HHPT for patient. Patient declined by [facility] per [name] because "his Needs are too complicated". CM explained that no care is needed for Prevena wound Vac and that MD will remove at appropriate time. [name] states they are not accepting patient. Case Manager has contacted [facility] Liaison with referral  and is awaiting return call.  1212pm [name] will accept patient for 3-4 PT sessions under a LOG. CM will contact Leadership to arrange.  1:28pm  CM submitted LOG form to Leadership.     Expected Discharge Plan: Home/Self Care Barriers to Discharge: Continued Medical Work up  Expected Discharge Plan and Services Expected Discharge Plan: Home/Self Care   Discharge Planning Services: CM Consult Post Acute Care Choice: Durable Medical Equipment (trying to get a wheelchair) Living arrangements for the past 2 months: Single Family Home                           HH Arranged: PT           Social Determinants of Health (SDOH) Interventions    Readmission Risk Interventions No flowsheet data found.

## 2020-12-16 ENCOUNTER — Inpatient Hospital Stay (HOSPITAL_COMMUNITY): Payer: Medicaid Other

## 2020-12-16 LAB — COMPREHENSIVE METABOLIC PANEL
ALT: 10 U/L (ref 0–44)
AST: 16 U/L (ref 15–41)
Albumin: 1.3 g/dL — ABNORMAL LOW (ref 3.5–5.0)
Alkaline Phosphatase: 77 U/L (ref 38–126)
Anion gap: 8 (ref 5–15)
BUN: 103 mg/dL — ABNORMAL HIGH (ref 6–20)
CO2: 24 mmol/L (ref 22–32)
Calcium: 7.8 mg/dL — ABNORMAL LOW (ref 8.9–10.3)
Chloride: 97 mmol/L — ABNORMAL LOW (ref 98–111)
Creatinine, Ser: 3.09 mg/dL — ABNORMAL HIGH (ref 0.61–1.24)
GFR, Estimated: 24 mL/min — ABNORMAL LOW (ref >60)
Glucose, Bld: 222 mg/dL — ABNORMAL HIGH (ref 70–99)
Potassium: 4.9 mmol/L (ref 3.5–5.1)
Sodium: 129 mmol/L — ABNORMAL LOW (ref 135–145)
Total Bilirubin: 0.4 mg/dL (ref 0.3–1.2)
Total Protein: 6.3 g/dL — ABNORMAL LOW (ref 6.5–8.1)

## 2020-12-16 LAB — GLUCOSE, CAPILLARY
Glucose-Capillary: 172 mg/dL — ABNORMAL HIGH (ref 70–99)
Glucose-Capillary: 154 mg/dL — ABNORMAL HIGH (ref 70–99)
Glucose-Capillary: 263 mg/dL — ABNORMAL HIGH (ref 70–99)
Glucose-Capillary: 335 mg/dL — ABNORMAL HIGH (ref 70–99)

## 2020-12-16 LAB — CBC WITH DIFFERENTIAL/PLATELET
Abs Immature Granulocytes: 0.16 10*3/uL — ABNORMAL HIGH (ref 0.00–0.07)
Basophils Absolute: 0 10*3/uL (ref 0.0–0.1)
Basophils Relative: 0 %
Eosinophils Absolute: 0.2 10*3/uL (ref 0.0–0.5)
Eosinophils Relative: 1 %
HCT: 24.5 % — ABNORMAL LOW (ref 39.0–52.0)
Hemoglobin: 7.7 g/dL — ABNORMAL LOW (ref 13.0–17.0)
Immature Granulocytes: 1 %
Lymphocytes Relative: 14 %
Lymphs Abs: 1.7 10*3/uL (ref 0.7–4.0)
MCH: 25 pg — ABNORMAL LOW (ref 26.0–34.0)
MCHC: 31.4 g/dL (ref 30.0–36.0)
MCV: 79.5 fL — ABNORMAL LOW (ref 80.0–100.0)
Monocytes Absolute: 0.9 10*3/uL (ref 0.1–1.0)
Monocytes Relative: 7 %
Neutro Abs: 9.4 10*3/uL — ABNORMAL HIGH (ref 1.7–7.7)
Neutrophils Relative %: 77 %
Platelets: 461 10*3/uL — ABNORMAL HIGH (ref 150–400)
RBC: 3.08 MIL/uL — ABNORMAL LOW (ref 4.22–5.81)
RDW: 19.4 % — ABNORMAL HIGH (ref 11.5–15.5)
WBC: 12.3 10*3/uL — ABNORMAL HIGH (ref 4.0–10.5)
nRBC: 0 % (ref 0.0–0.2)

## 2020-12-16 LAB — BRAIN NATRIURETIC PEPTIDE: B Natriuretic Peptide: 1574.3 pg/mL — ABNORMAL HIGH (ref 0.0–100.0)

## 2020-12-16 MED ORDER — FERROUS SULFATE 325 (65 FE) MG PO TABS
325.0000 mg | ORAL_TABLET | Freq: Three times a day (TID) | ORAL | Status: DC
  Administered 2020-12-16 – 2020-12-20 (×13): 325 mg via ORAL
  Filled 2020-12-16 (×11): qty 1

## 2020-12-16 MED ORDER — BISACODYL 10 MG RE SUPP
20.0000 mg | Freq: Once | RECTAL | Status: AC
  Administered 2020-12-16: 20 mg via RECTAL
  Filled 2020-12-16: qty 2

## 2020-12-16 MED ORDER — LACTATED RINGERS IV SOLN: INTRAVENOUS | Status: DC

## 2020-12-16 MED ORDER — FLEET ENEMA 7-19 GM/118ML RE ENEM: 1.0000 | ENEMA | Freq: Every day | RECTAL | Status: DC | PRN

## 2020-12-16 NOTE — Progress Notes (Signed)
Patient ID: Todd Mccoy, male   DOB: [birth year], 50 y.o.   MRN: [id number] Patient progressing well with therapy no drainage in the wound VAC canister.  Patient is still having difficulty with constipation.  Patient has tried multiple oral laxatives.  Will order a ene home tomorrow.  Ma.  Possible discharge to

## 2020-12-16 NOTE — Progress Notes (Signed)
Physical Therapy Treatment Patient Details Name: Todd Mccoy MRN: [id number] DOB: [birth year] Today's Date: 12/16/2020    History of Present Illness Pt is a 50 y/o male who presents 6/8   dehiscence of a recent left fifth ray amputation and new ulceration medial left foot.  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 Worked on progressing gait distance today, however after going just a few feet with RW he became very anxious about mobilizing in new environment due to vision loss and refused to go further, stating "I don't see why I need to go walking in a hallway" and becoming fearful of falling even with close PT guarding and chair follow. Difficult to convince him or educate him otherwise. Discussed potentially being at [facility] level at home/in community and encouraged use of a ramp at home to eliminate need to do steps as feel he would have a hard time with this.  Left up in recliner with all needs met. Will continue to follow.   Follow Up Recommendations        Equipment Recommendations  Wheelchair (measurements PT);Wheelchair cushion (measurements PT) (18" iwth removalbe arm rests and elevating leg rests)    Recommendations for Other Services       Precautions / Restrictions Precautions Precautions: Fall Precaution Comments: L BKA, wound vac Other Brace: limb protector Restrictions Weight Bearing Restrictions: Yes LLE Weight Bearing: Non weight bearing    Mobility  Bed Mobility Overal bed mobility: Needs Assistance Bed Mobility: Supine to Sit     Supine to sit: Supervision     General bed mobility comments: no physcial assist needed, cues for sequence and body awareness d/t visual deficits    Transfers Overall transfer level: Needs assistance   Transfers: Sit to/from Stand Sit to Stand: Min guard         General transfer comment: min guard to  boost to full upright standing and gain balance with RW  Ambulation/Gait Ambulation/Gait assistance: Min assist Gait Distance (Feet): 4 Feet Assistive device: Rolling walker (2 wheeled) Gait Pattern/deviations:  (hop to pattern) Gait velocity: decr   General Gait Details: slow and mildly unsteady wtih RW; needed MinA for balance as well as for progression of RW and Max VC for navigation in environment due to poor vision. Very anxious about mobilizing in unfamiliar environment and refused attempting gait more than a few feet   Stairs             Wheelchair Mobility    Modified Rankin (Stroke Patients Only)       Balance Overall balance assessment: Needs assistance Sitting-balance support: No upper extremity supported;Feet supported Sitting balance-Leahy Scale: Good     Standing balance support: Bilateral upper extremity supported;During functional activity Standing balance-Leahy Scale: Poor Standing balance comment: reliant on UE support and at least min guard for standing                            Cognition Arousal/Alertness: Awake/alert Behavior During Therapy: Flat affect;Anxious Overall Cognitive Status: Within Functional Limits for tasks assessed                                 General Comments: generally WFL but very anxious about mobility in an unfamiliar environment, difficult to distract  him from this anxiety      Exercises      General Comments        Pertinent Vitals/Pain Pain Assessment: No/denies pain Faces Pain Scale: No hurt Pain Intervention(s): Monitored during session;Limited activity within patient's tolerance    Home Living                      Prior Function            PT Goals (current goals can now be found in the care plan section) Acute Rehab PT Goals Patient Stated Goal: to go to bathroom PT Goal Formulation: With patient Time For Goal Achievement: 12/25/20 Potential to Achieve Goals:  Good Additional Goals Additional Goal #1: pt will demonstrate ability to mobilize in a w/c with UE's and or R LE in home-like environment  (if pt is to go directly home) Progress towards PT goals: Progressing toward goals    Frequency    Min 4X/week      PT Plan Current plan remains appropriate    Co-evaluation              AM-PAC PT "6 Clicks" Mobility   Outcome Measure  Help needed turning from your back to your side while in a flat bed without using bedrails?: None Help needed moving from lying on your back to sitting on the side of a flat bed without using bedrails?: A Little Help needed moving to and from a bed to a chair (including a wheelchair)?: A Little Help needed standing up from a chair using your arms (e.g., wheelchair or bedside chair)?: A Little Help needed to walk in hospital room?: A Little Help needed climbing 3-5 steps with a railing? : A Lot 6 Click Score: 18    End of Session Equipment Utilized During Treatment: Gait belt Activity Tolerance: Patient tolerated treatment well Patient left: in chair;with call bell/phone within reach Nurse Communication: Mobility status PT Visit Diagnosis: Other abnormalities of gait and mobility (R26.89);Muscle weakness (generalized) (M62.81);Difficulty in walking, not elsewhere classified (R26.2)     Time: 2440-1027 PT Time Calculation (min) (ACUTE ONLY): 18 min  Charges:  $Gait Training: 8-22 mins                    Windell Norfolk, DPT, PN1   Supplemental Physical Therapist [place]    Pager [phone] Acute Rehab Office [phone]

## 2020-12-16 NOTE — Progress Notes (Signed)
PROGRESS NOTE                                                                                                                                                                                                             Patient Demographics:    Todd Mccoy, is a 50 y.o. male, DOB - 11-Oct-1970, [id number]  Outpatient Primary MD for the patient is [name], MD    LOS - 6  Admit date - 12/10/2020    CC - L.Foot infection     Brief Narrative (HPI from H&P) Todd Mccoy is a 50 y.o. 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   Subjective:   Patient in bed, appears comfortable, denies any headache, no fever, no chest pain or pressure, no shortness of breath , no abdominal pain. No new focal weakness. Feels constipated.   Assessment  & Plan :     Recurrent left foot infection.  S/p BKA.  Defer management to primary team orthopedics, stump site under bandage. ABX stopped.  Case discussed with orthopedics bedside on 12/13/20.  2.  AKI on CKD stage IIIa.  Baseline creatinine around 1.7, he appears severely dehydrated, FeNa still 0.3, IVF + PRBCs as needed, slowly improving, no SOB or rales.so far 5 Lits +ve..  3.  Dyslipidemia.  On statin continue.  4.  Tobacco abuse.  Counseled to quit.  5.  Perioperative blood loss related anemia.  Received 1 unit of packed RBC earlier this admission receiving second unit on 12/12/2020, no signs of ongoing bleeding, on twice daily PPI, will monitor stools.  6. Constipation - on aggressive B. Regimen ( miralax, colace, dulcolax, Movantik, MOM, Go Lytely.) + enema PRN.  7. DM type II.  Currently on SSI, increased Lantus dose.  Lab Results  Component  Value Date   HGBA1C 8.6 (H) 11/12/2020    CBG (last 3)  Recent Labs    12/15/20 1631 12/15/20 2106 12/16/20 0728  GLUCAP 202* 260* 172*         Condition - Fair  Family Communication  : Per Primary team - Ortho  Code Status :  Full  Consults  :  TRH consulting  PUD Prophylaxis :    Procedures  :     L BKA  Renal US - Non acute      Disposition Plan  :    Status is: Inpatient  Remains inpatient appropriate because:IV treatments appropriate due to intensity of illness or inability to take PO  Dispo: The patient is from: Home              Anticipated d/c is to: SNF              Patient currently is not medically stable to d/c.   Difficult to place patient No   DVT Prophylaxis  : Heparin added 12/11/2020.  heparin injection 5,000 Units Start: 12/11/20 1400 SCD's Start: 12/10/20 1712   Lab Results  Component Value Date   PLT 461 (H) 12/16/2020    Diet :  Diet Order             Diet Carb Modified Fluid consistency: Thin; Room service appropriate? Yes  Diet effective now                    Inpatient Medications  Scheduled Meds:  vitamin C  1,000 mg Oral Daily   atorvastatin  20 mg Oral Daily   carvedilol  3.125 mg Oral BID WC   docusate sodium  200 mg Oral BID   ferrous sulfate  325 mg Oral TID WC   folic acid  1 mg Oral Daily   gabapentin  300 mg Oral QHS   heparin injection (subcutaneous)  5,000 Units Subcutaneous Q8H   insulin aspart  0-5 Units Subcutaneous QHS   insulin aspart  0-9 Units Subcutaneous TID WC   insulin glargine  25 Units Subcutaneous Daily   lactose free nutrition  237 mL Oral TID WC   multivitamin with minerals  1 tablet Oral Daily   nutrition supplement (JUVEN)  1 packet Oral BID BM   pantoprazole  40 mg Oral BID   Ensure Max Protein  11 oz Oral QHS   tamsulosin  0.4 mg Oral Daily   zinc sulfate  220 mg Oral Daily   Continuous Infusions:  lactated ringers 50 mL/hr at 12/16/20 0821   PRN Meds:.acetaminophen,  alum & mag hydroxide-simeth, guaiFENesin-dextromethorphan, hydrALAZINE, naphazoline-glycerin, ondansetron, oxyCODONE, phenol, sodium phosphate  Antibiotics  :    Anti-infectives (From admission, onward)    Start     Dose/Rate Route Frequency Ordered Stop   12/10/20 2000  cefTRIAXone (ROCEPHIN) 2 g in sodium chloride 0.9 % 100 mL IVPB       See Hyperspace for full Linked Orders Report.   2 g 200 mL/hr over 30 Minutes Intravenous Every 24 hours 12/10/20 1853 12/11/20 2359   12/10/20 2000  metroNIDAZOLE (FLAGYL) IVPB 500 mg       See Hyperspace for full Linked Orders Report.   500 mg 100 mL/hr over 60 Minutes Intravenous Every 8 hours 12/10/20 1853 12/11/20 2359   12/10/20 1715  ceFAZolin (ANCEF) IVPB 2g/100 mL premix  Status:  Discontinued        2 g 200 mL/hr over 30 Minutes Intravenous Every 8 hours 12/10/20 1711 12/10/20 1853   12/10/20 1245  vancomycin (VANCOREADY) IVPB 1000 mg/200 mL        1,000 mg 200 mL/hr over 60 Minutes Intravenous  Once 12/10/20 1241 12/10/20 1350   12/10/20 1135  vancomycin (VANCOCIN) 1-5 GM/200ML-% IVPB       Note  to Pharmacy: Renda Rolls   : cabinet override      12/10/20 1135 12/10/20 1247   12/10/20 1130  ceFAZolin (ANCEF) IVPB 2g/100 mL premix        2 g 200 mL/hr over 30 Minutes Intravenous On call to O.R. 12/10/20 1122 12/10/20 1322        Time Spent in minutes  30   Lala Lund M.D on 12/16/2020 at 9:46 AM  To page go to [email or web address]   [facility] -  Office  [phone]   See all Orders from today for further details    Objective:   Vitals:   12/15/20 0429 12/15/20 1623 12/15/20 2103 12/16/20 0425  BP: (!) 154/92 (!) 153/95 (!) 162/95 (!) 151/85  Pulse: 91 87 90 98  Resp: '16 18 19 20  '$ Temp: 98.4 F (36.9 C) 97.7 F (36.5 C) 97.8 F (36.6 C) 97.8 F (36.6 C)  TempSrc: Oral Oral Axillary Oral  SpO2: 100%  100% 100%  Weight:      Height:        Wt Readings from Last 3 Encounters:  12/10/20 79.4 kg  11/12/20  89.8 kg  10/22/20 89.8 kg     Intake/Output Summary (Last 24 hours) at 12/16/2020 0946 Last data filed at 12/16/2020 0852 Gross per 24 hour  Intake 764.69 ml  Output 1250 ml  Net -485.31 ml     Physical Exam  Awake Alert, No new F.N deficits, Normal affect [place].AT,PERRAL Supple Neck,No JVD, No cervical lymphadenopathy appriciated.  Symmetrical Chest wall movement, Good air movement bilaterally, CTAB RRR,No Gallops, Rubs or new Murmurs, No Parasternal Heave +ve B.Sounds, Abd Soft, No tenderness, No organomegaly appriciated, No rebound - guarding or rigidity. L. BKA    Data Review:    CBC Recent Labs  Lab 12/12/20 0150 12/12/20 1254 12/13/20 0057 12/14/20 0104 12/15/20 0204 12/16/20 0100  WBC 19.5*  --  16.6* 15.3* 14.2* 12.3*  HGB 7.1* 9.0* 7.6* 8.0* 8.1* 7.7*  HCT 23.7* 28.9* 24.1* 25.6* 26.1* 24.5*  PLT 559*  --  469* 544* 521* 461*  MCV 80.1  --  78.8* 79.3* 80.3 79.5*  MCH 24.0*  --  24.8* 24.8* 24.9* 25.0*  MCHC 30.0  --  31.5 31.3 31.0 31.4  RDW 19.6*  --  19.0* 19.2* 19.1* 19.4*  LYMPHSABS 2.1  --  1.9 2.1 1.7 1.7  MONOABS 1.3*  --  1.2* 0.9 0.8 0.9  EOSABS 0.2  --  0.2 0.2 0.2 0.2  BASOSABS 0.0  --  0.0 0.0 0.0 0.0    Recent Labs  Lab 12/10/20 1901 12/11/20 0236 12/11/20 0721 12/12/20 0150 12/13/20 0057 12/14/20 0104 12/15/20 0204 12/16/20 0100  NA  --    < >  --  132* 131* 128* 129* 129*  K  --    < >  --  4.0 4.4 4.6 4.9 4.9  CL  --    < >  --  95* 98 95* 96* 97*  CO2  --    < >  --  '28 25 26 27 24  '$ GLUCOSE  --    < >  --  120* 263* 235* 236* 222*  BUN  --    < >  --  61* 74* 82* 96* 103*  CREATININE  --    < >  --  3.36* 3.24* 3.10* 3.29* 3.09*  CALCIUM  --    < >  --  7.7* 7.4* 7.6* 7.8* 7.8*  AST  --   --   --  13* 13* 13* 14* 16  ALT  --   --   --  '7 7 7 8 10  '$ ALKPHOS  --   --   --  94 84 79 80 77  BILITOT  --   --   --  0.6 0.5 0.5 0.4 0.4  ALBUMIN  --   --   --  1.3* 1.2* 1.3* 1.3* 1.3*  MG  --   --  1.6* 2.0 1.9 2.0  --   --   CRP  29.8*  --   --   --   --   --   --   --   PROCALCITON  --   --  0.83 0.71 0.50  --   --   --   BNP  --    < > 1,024.0* 939.6* 975.1* 1,374.8* 1,520.6* 1,574.3*   < > = values in this interval not displayed.    ------------------------------------------------------------------------------------------------------------------ No results for input(s): CHOL, HDL, LDLCALC, TRIG, CHOLHDL, LDLDIRECT in the last 72 hours.  Lab Results  Component Value Date   HGBA1C 8.6 (H) 11/12/2020   ------------------------------------------------------------------------------------------------------------------ No results for input(s): TSH, T4TOTAL, T3FREE, THYROIDAB in the last 72 hours.  Invalid input(s): FREET3  Cardiac Enzymes No results for input(s): CKMB, TROPONINI, MYOGLOBIN in the last 168 hours.  Invalid input(s): CK ------------------------------------------------------------------------------------------------------------------    Component Value Date/Time   BNP 1,574.3 (H) 12/16/2020 0100    Micro Results Recent Results (from the past 240 hour(s))  SARS Coronavirus 2 by RT PCR (hospital order, performed in [facility] hospital lab) Nasopharyngeal Nasopharyngeal Swab     Status: None   Collection Time: 12/10/20 11:25 AM   Specimen: Nasopharyngeal Swab  Result Value Ref Range Status   SARS Coronavirus 2 NEGATIVE NEGATIVE Final    Comment: Performed at [facility], [place] [address], [place], [place] [address]  Culture, blood (routine x 2)     Status: None   Collection Time: 12/10/20  7:01 PM   Specimen: BLOOD  Result Value Ref Range Status   Specimen Description BLOOD SITE NOT SPECIFIED  Final   Special Requests   Final    BOTTLES DRAWN AEROBIC AND ANAEROBIC Blood Culture adequate volume   Culture   Final    NO GROWTH 5 DAYS Performed at [facility], [place] [address], [place], [place] [address]    Report Status 12/15/2020 FINAL  Final  Culture, blood (routine x 2)      Status: None   Collection Time: 12/10/20  7:01 PM   Specimen: BLOOD  Result Value Ref Range Status   Specimen Description BLOOD SITE NOT SPECIFIED  Final   Special Requests   Final    BOTTLES DRAWN AEROBIC ONLY Blood Culture results may not be optimal due to an inadequate volume of blood received in culture bottles   Culture   Final    NO GROWTH 5 DAYS Performed at [facility], [place] [address], [place],  [address]    Report Status 12/15/2020 FINAL  Final    Radiology Reports US RENAL  Result Date: 12/12/2020 CLINICAL DATA:  Acute renal injury. EXAM: RENAL / URINARY TRACT ULTRASOUND COMPLETE COMPARISON:  CT abdomen pelvis 09/07/2014 FINDINGS: Right Kidney: Renal measurements: 12.6 x 5 x 6 cm = volume: 198 mL. Echogenicity within normal limits. No mass or hydronephrosis visualized. Left Kidney: Renal measurements: 12.8 x 5.3 x 5.4 cm = volume: 189 mL. Echogenicity within normal limits. No mass or hydronephrosis  visualized. Urinary bladder: Thickened bladder wall. Otherwise appears normal for degree of bladder distention. Other: At least small volume ascites.  Right pleural effusion. IMPRESSION: 1. Thickened urinary bladder wall. Correlate with urinalysis for infection. 2. At least small volume ascites. 3. Right pleural effusion. Electronically Signed   By: Iven Finn M.D.   On: 12/12/2020 05:01   DG Chest Port 1 View  Result Date: 12/15/2020 CLINICAL DATA:  Shortness of breath EXAM: PORTABLE CHEST 1 VIEW COMPARISON:  Renal ultrasound 12/11/2020 FINDINGS: Enlargement of the cardiopericardial silhouette. Fill like opacity in the right lung base with significant obscuration of the right cardiac margin. Mild patchy airspace opacity at the left lung base and left retrocardiac region as well. No pneumothorax. Mild vascular congestion without overt edema. No acute osseous abnormality. IMPRESSION: 1. Cardiomegaly and pulmonary vascular congestion without overt edema. 2. Right basilar  airspace opacity most consistent with a moderate layering right pleural effusion and associated right middle and lower lobe atelectasis versus infiltrate. 3. Nonspecific patchy airspace opacity in the left base also likely reflects atelectasis and/or infiltrate. Electronically Signed   By: Jacqulynn Cadet M.D.   On: 12/15/2020 07:51   DG Chest Port 1 View  Result Date: 12/11/2020 CLINICAL DATA:  Shortness of breath. EXAM: PORTABLE CHEST 1 VIEW COMPARISON:  12/10/2020. FINDINGS: Cardiomegaly with mild pulmonary venous congestion. Low lung volumes with bibasilar atelectasis. Bibasilar infiltrates/edema cannot be excluded. Small right pleural effusion. No pneumothorax. IMPRESSION: Cardiomegaly with mild pulmonary venous congestion. 2. Low lung volumes with bibasilar atelectasis. Bibasilar infiltrates/edema cannot be excluded. Small right pleural effusion. Electronically Signed   By: Marcello Moores  Register   On: 12/11/2020 08:06   DG CHEST PORT 1 VIEW  Result Date: 12/10/2020 CLINICAL DATA:  Shortness of breath.  Preoperative study. EXAM: PORTABLE CHEST 1 VIEW COMPARISON:  Chest x-ray 09/06/2014. FINDINGS: Mediastinum and hilar structures normal. Cardiomegaly. Mild pulmonary venous congestion. Right base infiltrate/edema. Small right pleural effusion. IMPRESSION: 1.  Cardiomegaly.  Mild pulmonary venous congestion. 2.  Right base infiltrate/edema.  Small right pleural effusion. These results will be called to the ordering clinician or representative by the Radiologist Assistant, and communication documented in the PACS or [employer]. Electronically Signed   By: Marcello Moores  Register   On: 12/10/2020 12:01   DG Abd Portable 1V  Result Date: 12/14/2020 CLINICAL DATA:  Abdominal distension. EXAM: PORTABLE ABDOMEN - 1 VIEW COMPARISON:  None. FINDINGS: Or imaging quality due to supine film and patient body habitus. Moderate colonic stool burden. No high-grade obstructive bowel gas pattern. No visible suspicious  abdominal calcifications. No acute or worrisome osseous abnormalities. IMPRESSION: Suboptimal imaging quality due to portable supine film and patient body habitus. Moderate stool burden. No convincing high-grade obstruction or other acute abdominal radiographic abnormality Electronically Signed   By: Lovena Le M.D.   On: 12/14/2020 05:57   XR Knee 1-2 Views Left  Result Date: 12/08/2020 2 view radiographs of the left knee shows progressive collapse of the medial tibial plateau fracture.  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  He also has some changes within the fourth metatarsal and concerns for further osteomyelitis

## 2020-12-17 LAB — CBC WITH DIFFERENTIAL/PLATELET
Abs Immature Granulocytes: 0.13 10*3/uL — ABNORMAL HIGH (ref 0.00–0.07)
Basophils Absolute: 0 10*3/uL (ref 0.0–0.1)
Basophils Relative: 0 %
Eosinophils Absolute: 0.1 10*3/uL (ref 0.0–0.5)
Eosinophils Relative: 1 %
HCT: 23.9 % — ABNORMAL LOW (ref 39.0–52.0)
Hemoglobin: 7.4 g/dL — ABNORMAL LOW (ref 13.0–17.0)
Immature Granulocytes: 1 %
Lymphocytes Relative: 14 %
Lymphs Abs: 1.6 10*3/uL (ref 0.7–4.0)
MCH: 24.9 pg — ABNORMAL LOW (ref 26.0–34.0)
MCHC: 31 g/dL (ref 30.0–36.0)
MCV: 80.5 fL (ref 80.0–100.0)
Monocytes Absolute: 0.7 10*3/uL (ref 0.1–1.0)
Monocytes Relative: 6 %
Neutro Abs: 8.9 10*3/uL — ABNORMAL HIGH (ref 1.7–7.7)
Neutrophils Relative %: 78 %
Platelets: 482 10*3/uL — ABNORMAL HIGH (ref 150–400)
RBC: 2.97 MIL/uL — ABNORMAL LOW (ref 4.22–5.81)
RDW: 19.8 % — ABNORMAL HIGH (ref 11.5–15.5)
WBC: 11.5 10*3/uL — ABNORMAL HIGH (ref 4.0–10.5)
nRBC: 0 % (ref 0.0–0.2)

## 2020-12-17 LAB — COMPREHENSIVE METABOLIC PANEL
ALT: 11 U/L (ref 0–44)
AST: 17 U/L (ref 15–41)
Albumin: 1.3 g/dL — ABNORMAL LOW (ref 3.5–5.0)
Alkaline Phosphatase: 74 U/L (ref 38–126)
Anion gap: 9 (ref 5–15)
BUN: 102 mg/dL — ABNORMAL HIGH (ref 6–20)
CO2: 26 mmol/L (ref 22–32)
Calcium: 8.2 mg/dL — ABNORMAL LOW (ref 8.9–10.3)
Chloride: 96 mmol/L — ABNORMAL LOW (ref 98–111)
Creatinine, Ser: 3.08 mg/dL — ABNORMAL HIGH (ref 0.61–1.24)
GFR, Estimated: 24 mL/min — ABNORMAL LOW (ref >60)
Glucose, Bld: 271 mg/dL — ABNORMAL HIGH (ref 70–99)
Potassium: 5 mmol/L (ref 3.5–5.1)
Sodium: 131 mmol/L — ABNORMAL LOW (ref 135–145)
Total Bilirubin: 0.2 mg/dL — ABNORMAL LOW (ref 0.3–1.2)
Total Protein: 6.5 g/dL (ref 6.5–8.1)

## 2020-12-17 LAB — GLUCOSE, CAPILLARY
Glucose-Capillary: 243 mg/dL — ABNORMAL HIGH (ref 70–99)
Glucose-Capillary: 260 mg/dL — ABNORMAL HIGH (ref 70–99)
Glucose-Capillary: 213 mg/dL — ABNORMAL HIGH (ref 70–99)
Glucose-Capillary: 147 mg/dL — ABNORMAL HIGH (ref 70–99)

## 2020-12-17 LAB — PATHOLOGIST SMEAR REVIEW

## 2020-12-17 LAB — BRAIN NATRIURETIC PEPTIDE: B Natriuretic Peptide: 1835.4 pg/mL — ABNORMAL HIGH (ref 0.0–100.0)

## 2020-12-17 MED ORDER — LACTULOSE 10 GM/15ML PO SOLN
30.0000 g | Freq: Two times a day (BID) | ORAL | Status: AC
  Administered 2020-12-17 (×2): 30 g via ORAL
  Filled 2020-12-17 (×3): qty 45

## 2020-12-17 MED ORDER — BISACODYL 10 MG RE SUPP
20.0000 mg | Freq: Once | RECTAL | Status: AC
  Administered 2020-12-17: 20 mg via RECTAL
  Filled 2020-12-17: qty 2

## 2020-12-17 MED ORDER — CARVEDILOL 12.5 MG PO TABS
12.5000 mg | ORAL_TABLET | Freq: Two times a day (BID) | ORAL | Status: DC
  Administered 2020-12-17 – 2020-12-22 (×9): 12.5 mg via ORAL
  Filled 2020-12-17 (×9): qty 1

## 2020-12-17 MED ORDER — CARVEDILOL 6.25 MG PO TABS
6.2500 mg | ORAL_TABLET | Freq: Two times a day (BID) | ORAL | Status: DC
  Administered 2020-12-17: 6.25 mg via ORAL
  Filled 2020-12-17: qty 1

## 2020-12-17 MED ORDER — INSULIN ASPART 100 UNIT/ML IJ SOLN
3.0000 [IU] | Freq: Three times a day (TID) | INTRAMUSCULAR | Status: DC
  Administered 2020-12-17 – 2020-12-22 (×11): 3 [IU] via SUBCUTANEOUS

## 2020-12-17 MED ORDER — CARVEDILOL 6.25 MG PO TABS
6.2500 mg | ORAL_TABLET | Freq: Once | ORAL | Status: AC
  Administered 2020-12-17: 6.25 mg via ORAL
  Filled 2020-12-17: qty 1

## 2020-12-17 MED ORDER — LACTATED RINGERS IV SOLN: INTRAVENOUS | Status: DC

## 2020-12-17 MED ORDER — OXYCODONE HCL 5 MG PO TABS: 5.0000 mg | ORAL_TABLET | ORAL | 0 refills | Status: DC | PRN

## 2020-12-17 MED ORDER — ZINC SULFATE 220 (50 ZN) MG PO CAPS: 220.0000 mg | ORAL_CAPSULE | Freq: Every day | ORAL | 1 refills | Status: DC

## 2020-12-17 MED ORDER — ISOSORBIDE MONONITRATE ER 30 MG PO TB24
30.0000 mg | ORAL_TABLET | Freq: Every day | ORAL | Status: DC
  Administered 2020-12-17 – 2020-12-22 (×6): 30 mg via ORAL
  Filled 2020-12-17 (×6): qty 1

## 2020-12-17 NOTE — Progress Notes (Signed)
PT Cancellation Note  Patient Details Name: Todd Mccoy MRN: [id number] DOB: 12-Jul-1970   Cancelled Treatment:    Reason Eval/Treat Not Completed: Patient declined, no reason specified attempted to work with patient, he was currently eating lunch and politely declines PT at this moment. Will attempt to return later in day if time/schedule allow.   Windell Norfolk, DPT, PN1   Supplemental Physical Therapist [facility]    Pager [phone] Acute Rehab Office [phone]

## 2020-12-17 NOTE — Progress Notes (Signed)
Inpatient Diabetes Program Recommendations  AACE/ADA: New Consensus Statement on Inpatient Glycemic Control (2015)  Target Ranges:  Prepandial:   less than 140 mg/dL      Peak postprandial:   less than 180 mg/dL (1-2 hours)      Critically ill patients:  140 - 180 mg/dL   Lab Results  Component Value Date   GLUCAP 243 (H) 12/17/2020   HGBA1C 8.6 (H) 11/12/2020    Review of Glycemic Control  Diabetes history: DM2 Outpatient Diabetes medications: Lantus 30 units QAM, metformin 500 mg BID Current orders for Inpatient glycemic control: Lantus 25 units QD, Novolog 0-9 units TID with meals and 0-5 HS + 3 units TID  HgbA1C - 8.6% - discussed with pt importance of tighter glycemic control  Inpatient Diabetes Program Recommendations:    Consider increasing Lantus to 28 units QD  Will continue to follow.  Thank you. Lorenda Peck, RD, LDN, CDE Inpatient Diabetes Coordinator [phone]

## 2020-12-17 NOTE — Progress Notes (Signed)
Occupational Therapy Treatment Patient Details Name: Todd Mccoy MRN: [id number] DOB: Oct 28, 1970 Today's Date: 12/17/2020    History of present illness Pt is a 50 y/o male who presents 6/8   dehiscence of a recent left fifth ray amputation and new ulceration medial left foot.  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 Reviewed all LB AE for bathing and dressing with pt agreeable that pt would benefit from reacher and LH sponge. Pt reports he still is unsure what type of shower seat his family got him, but reinforced that a tub transfer bench would be best for safety. Wrote down name of seat and provided a photo for pt to show his family as well as where to purchase seat. Pt declined further mobility or BADL participation d/t need to void bowels. Pt would continue to benefit from skilled occupational therapy while admitted and after d/c to address the below listed limitations in order to improve overall functional mobility and facilitate independence with BADL participation. DC plan remains appropriate, will follow acutely per POC.    Follow Up Recommendations  CIR;Supervision/Assistance - 24 hour;Home health OT    Equipment Recommendations  3 in 1 bedside commode;Other (comment);Tub/shower bench (drop arm 3n1)    Recommendations for Other Services      Precautions / Restrictions Precautions Precautions: Fall Precaution Comments: L BKA, wound vac Required Braces or Orthoses: Other Brace Other Brace: limb protector Restrictions Weight Bearing Restrictions: Yes LLE Weight Bearing: Non weight bearing       Mobility Bed Mobility               General bed mobility comments: pt recieved getting to [facility] and left on BSC at end of session with call bell within reach    Transfers                 General transfer comment:  observed pt getting to [facility] with NT and RN via stand pivot transfer with RW    Balance Overall balance assessment: Needs assistance Sitting-balance support: No upper extremity supported;Feet supported Sitting balance-Leahy Scale: Good                                     ADL either performed or assessed with clinical judgement   ADL Overall ADL's : Needs assistance/impaired               Lower Body Bathing Details (indicate cue type and reason): issued pt LH sponge for LB bathing, education provided on uses in shower to decrease risk of falls       Lower Body Dressing Details (indicate cue type and reason): education provided on compensatory methods for LB dressing with wound vac, additionally issued pt reacher to assist with donning pants   Toilet Transfer Details (indicate cue type and reason): observed SPT to [facility] with NT and RN       Tub/Shower Transfer Details (indicate cue type and reason): reinforced education on importance of TTB for tub shower   General ADL Comments: pt recieved getting to [facility] with NT and RN, pt continues to present with impaired vision,  proprioception, balance and coordination     Vision Baseline Vision/History: Wears glasses Wears Glasses: At all times Patient Visual Report: [place]  vision impairment;Other (comment) (L eye blind; 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) Additional Comments: pt needing tactile cues to formalize self with AE   Perception     Praxis      Cognition Arousal/Alertness: Awake/alert Behavior During Therapy: Flat affect Overall Cognitive Status: Within Functional Limits for tasks assessed                                 General Comments: perseverating on wanting to sit on [facility]        Exercises     Shoulder Instructions       General Comments issued pt reacher and LH sponge, provided instructions on appropriate DME and where to purchase    Pertinent Vitals/ Pain       Pain Assessment:  Faces Faces Pain Scale: Hurts a little bit Pain Location: stomach Pain Descriptors / Indicators: Cramping Pain Intervention(s): Monitored during session  Home Living                                          Prior Functioning/Environment              Frequency  Min 2X/week        Progress Toward Goals  OT Goals(current goals can now be found in the care plan section)  Progress towards OT goals: Progressing toward goals  Acute Rehab OT Goals Patient Stated Goal: to go to bathroom OT Goal Formulation: With patient Time For Goal Achievement: 12/25/20 Potential to Achieve Goals: [place] Discharge plan remains appropriate;Frequency remains appropriate    Co-evaluation                 AM-PAC OT "6 Clicks" Daily Activity     Outcome Measure   Help from another person eating meals?: None Help from another person taking care of personal grooming?: A Little Help from another person toileting, which includes using toliet, bedpan, or urinal?: A Little Help from another person bathing (including washing, rinsing, drying)?: A Little Help from another person to put on and taking off regular upper body clothing?: None Help from another person to put on and taking off regular lower body clothing?: A Lot 6 Click Score: 19    End of Session Equipment Utilized During Treatment: Other (comment) (AE)  OT Visit Diagnosis: Unsteadiness on feet (R26.81);Other abnormalities of gait and mobility (R26.89);Low vision, both eyes (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

## 2020-12-17 NOTE — Progress Notes (Signed)
PROGRESS NOTE                                                                                                                                                                                                             Patient Demographics:    Todd Mccoy, is a 50 y.o. male, DOB - [birth year], [id number]  Outpatient Primary MD for the patient is [name], MD    LOS - 7  Admit date - 12/10/2020    CC - L.Foot infection     Brief Narrative (HPI from H&P) Todd Mccoy is a 50 y.o. 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   Subjective:   Patient in bed, appears comfortable, denies any headache, no fever, no chest pain or pressure, no shortness of breath , no abdominal pain. No new focal weakness. Still feels constipated but now having some bowel movements had 2 yesterday.   Assessment  & Plan :     Recurrent left foot infection.  S/p BKA.  Defer management to primary team orthopedics, stump site under bandage. ABX stopped.  Case discussed with orthopedics bedside on 12/13/20.  2.  AKI on CKD stage IIIa.  Baseline creatinine around 1.7, he appears severely dehydrated, FeNa still 0.3, IVF + PRBCs as needed, slowly improving, no SOB or rales.so far 6.5 Lits +ve.  Renal function gradually improving if improves further for another day likely stable for discharge on 12/18/2020.  3.  Dyslipidemia.  On statin continue.  4.  Tobacco abuse.  Counseled to quit.  5.  Perioperative blood loss related anemia.  Received 1 unit of packed RBC earlier this admission receiving second unit on 12/12/2020, no signs of ongoing bleeding, on twice daily PPI, will monitor stools.  6. Constipation - on  aggressive B. Regimen ( miralax, colace, dulcolax, Movantik, MOM, Go Lytely, lactulose) + enema PRN.  Now starting to have bowel movements continue bowel regimen for another day.  7. DM type II.  Currently on SSI, increased Lantus dose.  Lab Results  Component Value Date   HGBA1C 8.6 (H) 11/12/2020    CBG (last 3)  Recent  Labs    12/16/20 1639 12/16/20 2150 12/17/20 0756  GLUCAP 263* 335* 243*         Condition - Fair  Family Communication  : Per Primary team - Ortho  Code Status :  Full  Consults  :  TRH consulting  PUD Prophylaxis :    Procedures  :     L BKA  Renal US - Non acute      Disposition Plan  :    Status is: Inpatient  Remains inpatient appropriate because:IV treatments appropriate due to intensity of illness or inability to take PO  Dispo: The patient is from: Home              Anticipated d/c is to: SNF              Patient currently is not medically stable to d/c.   Difficult to place patient No   DVT Prophylaxis  : Heparin added 12/11/2020.  heparin injection 5,000 Units Start: 12/11/20 1400 SCD's Start: 12/10/20 1712   Lab Results  Component Value Date   PLT 482 (H) 12/17/2020    Diet :  Diet Order             Diet - low sodium heart healthy           Diet Carb Modified Fluid consistency: Thin; Room service appropriate? Yes  Diet effective now                    Inpatient Medications  Scheduled Meds:  vitamin C  1,000 mg Oral Daily   atorvastatin  20 mg Oral Daily   bisacodyl  20 mg Rectal Once   carvedilol  6.25 mg Oral BID WC   docusate sodium  200 mg Oral BID   ferrous sulfate  325 mg Oral TID WC   folic acid  1 mg Oral Daily   gabapentin  300 mg Oral QHS   heparin injection (subcutaneous)  5,000 Units Subcutaneous Q8H   insulin aspart  0-5 Units Subcutaneous QHS   insulin aspart  0-9 Units Subcutaneous TID WC   insulin glargine  25 Units Subcutaneous Daily   isosorbide mononitrate  30 mg Oral Daily    lactose free nutrition  237 mL Oral TID WC   lactulose  30 g Oral BID   multivitamin with minerals  1 tablet Oral Daily   nutrition supplement (JUVEN)  1 packet Oral BID BM   pantoprazole  40 mg Oral BID   Ensure Max Protein  11 oz Oral QHS   tamsulosin  0.4 mg Oral Daily   zinc sulfate  220 mg Oral Daily   Continuous Infusions:  lactated ringers 75 mL/hr at 12/17/20 0942   PRN Meds:.acetaminophen, alum & mag hydroxide-simeth, guaiFENesin-dextromethorphan, hydrALAZINE, naphazoline-glycerin, ondansetron, oxyCODONE, phenol, sodium phosphate  Antibiotics  :    Anti-infectives (From admission, onward)    Start     Dose/Rate Route Frequency Ordered Stop   12/10/20 2000  cefTRIAXone (ROCEPHIN) 2 g in sodium chloride 0.9 % 100 mL IVPB       See Hyperspace for full Linked Orders Report.   2 g 200 mL/hr over 30 Minutes Intravenous Every 24 hours 12/10/20 1853 12/11/20 2359   12/10/20 2000  metroNIDAZOLE (FLAGYL) IVPB 500 mg       See Hyperspace for full Linked Orders Report.   500 mg 100 mL/hr over 60 Minutes Intravenous Every 8 hours 12/10/20 1853 12/11/20 2359   12/10/20 1715  ceFAZolin (ANCEF) IVPB 2g/100 mL premix  Status:  Discontinued        2 g 200 mL/hr over 30 Minutes Intravenous Every 8 hours 12/10/20 1711 12/10/20 1853   12/10/20 1245  vancomycin (VANCOREADY) IVPB 1000 mg/200 mL        1,000 mg 200 mL/hr over 60 Minutes Intravenous  Once 12/10/20 1241 12/10/20 1350   12/10/20 1135  vancomycin (VANCOCIN) 1-5 GM/200ML-% IVPB       Note to Pharmacy: Renda Rolls   : cabinet override      12/10/20 1135 12/10/20 1247   12/10/20 1130  ceFAZolin (ANCEF) IVPB 2g/100 mL premix        2 g 200 mL/hr over 30 Minutes Intravenous On call to O.R. 12/10/20 1122 12/10/20 1322        Time Spent in minutes  30   Lala Lund M.D on 12/17/2020 at 10:00 AM  To page go to [email or web address]   [facility] -  Office  [phone]   See all Orders from today for further  details    Objective:   Vitals:   12/16/20 2157 12/17/20 0414 12/17/20 0736 12/17/20 0938  BP: (!) 162/94 (!) 168/93 (!) 160/93 (!) 158/95  Pulse: (!) 102 100    Resp: 18 20    Temp: 98.1 F (36.7 C) 98.3 F (36.8 C)    TempSrc: Axillary Oral    SpO2:  96%    Weight:      Height:        Wt Readings from Last 3 Encounters:  12/10/20 79.4 kg  11/12/20 89.8 kg  10/22/20 89.8 kg     Intake/Output Summary (Last 24 hours) at 12/17/2020 1000 Last data filed at 12/17/2020 0425 Gross per 24 hour  Intake 604.87 ml  Output 275 ml  Net 329.87 ml     Physical Exam  Awake Alert, No new F.N deficits, Normal affect [place].AT,PERRAL Supple Neck,No JVD, No cervical lymphadenopathy appriciated.  Symmetrical Chest wall movement, Good air movement bilaterally, CTAB RRR,No Gallops, Rubs or new Murmurs, No Parasternal Heave +ve B.Sounds, Abd Soft, No tenderness, No organomegaly appriciated, No rebound - guarding or rigidity. L. BKA    Data Review:    CBC Recent Labs  Lab 12/13/20 0057 12/14/20 0104 12/15/20 0204 12/16/20 0100 12/17/20 0539  WBC 16.6* 15.3* 14.2* 12.3* 11.5*  HGB 7.6* 8.0* 8.1* 7.7* 7.4*  HCT 24.1* 25.6* 26.1* 24.5* 23.9*  PLT 469* 544* 521* 461* 482*  MCV 78.8* 79.3* 80.3 79.5* 80.5  MCH 24.8* 24.8* 24.9* 25.0* 24.9*  MCHC 31.5 31.3 31.0 31.4 31.0  RDW 19.0* 19.2* 19.1* 19.4* 19.8*  LYMPHSABS 1.9 2.1 1.7 1.7 1.6  MONOABS 1.2* 0.9 0.8 0.9 0.7  EOSABS 0.2 0.2 0.2 0.2 0.1  BASOSABS 0.0 0.0 0.0 0.0 0.0    Recent Labs  Lab 12/10/20 1901 12/11/20 0236 12/11/20 0721 12/11/20 0721 12/12/20 0150 12/13/20 0057 12/14/20 0104 12/15/20 0204 12/16/20 0100 12/17/20 0539  NA  --    < >  --   --  132* 131* 128* 129* 129* 131*  K  --    < >  --   --  4.0 4.4 4.6 4.9 4.9 5.0  CL  --    < >  --   --  95* 98 95* 96* 97* 96*  CO2  --    < >  --   --  '28 25 26 27 24 26  '$ GLUCOSE  --    < >  --   --  120* 263* 235* 236* 222* 271*  BUN  --    < >  --   --  61* 74*  82* 96* 103* 102*  CREATININE  --    < >  --   --  3.36* 3.24* 3.10* 3.29* 3.09* 3.08*  CALCIUM  --    < >  --   --  7.7* 7.4* 7.6* 7.8* 7.8* 8.2*  AST  --   --   --    < > 13* 13* 13* 14* 16 17  ALT  --   --   --    < > '7 7 7 8 10 11  '$ ALKPHOS  --   --   --    < > 94 84 79 80 77 74  BILITOT  --   --   --    < > 0.6 0.5 0.5 0.4 0.4 0.2*  ALBUMIN  --   --   --    < > 1.3* 1.2* 1.3* 1.3* 1.3* 1.3*  MG  --   --  1.6*  --  2.0 1.9 2.0  --   --   --   CRP 29.8*  --   --   --   --   --   --   --   --   --   PROCALCITON  --   --  0.83  --  0.71 0.50  --   --   --   --   BNP  --    < > 1,024.0*  --  939.6* 975.1* 1,374.8* 1,520.6* 1,574.3* 1,835.4*   < > = values in this interval not displayed.    ------------------------------------------------------------------------------------------------------------------ No results for input(s): CHOL, HDL, LDLCALC, TRIG, CHOLHDL, LDLDIRECT in the last 72 hours.  Lab Results  Component Value Date   HGBA1C 8.6 (H) 11/12/2020   ------------------------------------------------------------------------------------------------------------------ No results for input(s): TSH, T4TOTAL, T3FREE, THYROIDAB in the last 72 hours.  Invalid input(s): FREET3  Cardiac Enzymes No results for input(s): CKMB, TROPONINI, MYOGLOBIN in the last 168 hours.  Invalid input(s): CK ------------------------------------------------------------------------------------------------------------------    Component Value Date/Time   BNP 1,835.4 (H) 12/17/2020 0539    Micro Results Recent Results (from the past 240 hour(s))  SARS Coronavirus 2 by RT PCR (hospital order, performed in [facility] hospital lab) Nasopharyngeal Nasopharyngeal Swab     Status: None   Collection Time: 12/10/20 11:25 AM   Specimen: Nasopharyngeal Swab  Result Value Ref Range Status   SARS Coronavirus 2 NEGATIVE NEGATIVE Final    Comment: Performed at [facility], [place] [address], [place],  [place] [address]  Culture, blood (routine x 2)     Status: None   Collection Time: 12/10/20  7:01 PM   Specimen: BLOOD  Result Value Ref Range Status   Specimen Description BLOOD SITE NOT SPECIFIED  Final   Special Requests   Final    BOTTLES DRAWN AEROBIC AND ANAEROBIC Blood Culture adequate volume   Culture   Final    NO GROWTH 5 DAYS Performed at [facility], [place] [address] [place], [place] [address]    Report Status 12/15/2020 FINAL  Final  Culture, blood (routine x 2)     Status: None   Collection Time: 12/10/20  7:01 PM   Specimen: BLOOD  Result Value Ref Range Status   Specimen Description BLOOD SITE NOT SPECIFIED  Final   Special Requests   Final    BOTTLES DRAWN AEROBIC ONLY Blood Culture  results may not be optimal due to an inadequate volume of blood received in culture bottles   Culture   Final    NO GROWTH 5 DAYS Performed at [facility], [place] [address] [place], [place] [address]    Report Status 12/15/2020 FINAL  Final    Radiology Reports DG Abd 1 View  Result Date: 12/16/2020 CLINICAL DATA:  50 year old male with abdominal distension, constipation. EXAM: ABDOMEN - 1 VIEW COMPARISON:  Abdominal radiographs 12/14/2020 and earlier. Chest radiographs 12/15/2020. FINDINGS: Abdominal and pelvis radiographs at 0947 hours. Non obstructed bowel gas pattern. Moderate volume of retained stool in the large bowel has not significantly changed. Other abdominal and pelvic visceral contours appear normal. No osseous abnormality identified. Lung bases are stable since yesterday. IMPRESSION: Nonobstructed bowel-gas pattern moderate volume of retained stool throughout the colon. Electronically Signed   By: Genevie Ann M.D.   On: 12/16/2020 09:58   US RENAL  Result Date: 12/12/2020 CLINICAL DATA:  Acute renal injury. EXAM: RENAL / URINARY TRACT ULTRASOUND COMPLETE COMPARISON:  CT abdomen pelvis 09/07/2014 FINDINGS: Right Kidney: Renal measurements: 12.6 x 5 x 6 cm = volume: 198 mL.  Echogenicity within normal limits. No mass or hydronephrosis visualized. Left Kidney: Renal measurements: 12.8 x 5.3 x 5.4 cm = volume: 189 mL. Echogenicity within normal limits. No mass or hydronephrosis visualized. Urinary bladder: Thickened bladder wall. Otherwise appears normal for degree of bladder distention. Other: At least small volume ascites.  Right pleural effusion. IMPRESSION: 1. Thickened urinary bladder wall. Correlate with urinalysis for infection. 2. At least small volume ascites. 3. Right pleural effusion. Electronically Signed   By: Iven Finn M.D.   On: 12/12/2020 05:01   DG Chest Port 1 View  Result Date: 12/15/2020 CLINICAL DATA:  Shortness of breath EXAM: PORTABLE CHEST 1 VIEW COMPARISON:  Renal ultrasound 12/11/2020 FINDINGS: Enlargement of the cardiopericardial silhouette. Fill like opacity in the right lung base with significant obscuration of the right cardiac margin. Mild patchy airspace opacity at the left lung base and left retrocardiac region as well. No pneumothorax. Mild vascular congestion without overt edema. No acute osseous abnormality. IMPRESSION: 1. Cardiomegaly and pulmonary vascular congestion without overt edema. 2. Right basilar airspace opacity most consistent with a moderate layering right pleural effusion and associated right middle and lower lobe atelectasis versus infiltrate. 3. Nonspecific patchy airspace opacity in the left base also likely reflects atelectasis and/or infiltrate. Electronically Signed   By: Jacqulynn Cadet M.D.   On: 12/15/2020 07:51   DG Chest Port 1 View  Result Date: 12/11/2020 CLINICAL DATA:  Shortness of breath. EXAM: PORTABLE CHEST 1 VIEW COMPARISON:  12/10/2020. FINDINGS: Cardiomegaly with mild pulmonary venous congestion. Low lung volumes with bibasilar atelectasis. Bibasilar infiltrates/edema cannot be excluded. Small right pleural effusion. No pneumothorax. IMPRESSION: Cardiomegaly with mild pulmonary venous congestion. 2. Low  lung volumes with bibasilar atelectasis. Bibasilar infiltrates/edema cannot be excluded. Small right pleural effusion. Electronically Signed   By: Marcello Moores  Register   On: 12/11/2020 08:06   DG CHEST PORT 1 VIEW  Result Date: 12/10/2020 CLINICAL DATA:  Shortness of breath.  Preoperative study. EXAM: PORTABLE CHEST 1 VIEW COMPARISON:  Chest x-ray 09/06/2014. FINDINGS: Mediastinum and hilar structures normal. Cardiomegaly. Mild pulmonary venous congestion. Right base infiltrate/edema. Small right pleural effusion. IMPRESSION: 1.  Cardiomegaly.  Mild pulmonary venous congestion. 2.  Right base infiltrate/edema.  Small right pleural effusion. These results will be called to the ordering clinician or representative by the Radiologist Assistant, and communication  documented in the PACS or [employer]. Electronically Signed   By: Marcello Moores  Register   On: 12/10/2020 12:01   DG Abd Portable 1V  Result Date: 12/14/2020 CLINICAL DATA:  Abdominal distension. EXAM: PORTABLE ABDOMEN - 1 VIEW COMPARISON:  None. FINDINGS: Or imaging quality due to supine film and patient body habitus. Moderate colonic stool burden. No high-grade obstructive bowel gas pattern. No visible suspicious abdominal calcifications. No acute or worrisome osseous abnormalities. IMPRESSION: Suboptimal imaging quality due to portable supine film and patient body habitus. Moderate stool burden. No convincing high-grade obstruction or other acute abdominal radiographic abnormality Electronically Signed   By: Lovena Le M.D.   On: 12/14/2020 05:57   XR Knee 1-2 Views Left  Result Date: 12/08/2020 2 view radiographs of the left knee shows progressive collapse of the medial tibial plateau fracture.  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  He also has some changes within the fourth metatarsal and concerns for further osteomyelitis

## 2020-12-17 NOTE — Discharge Summary (Signed)
Discharge Diagnoses:  Principal Problem:   Subacute osteomyelitis, left ankle and foot ([place]) Active Problems:   DM2 (diabetes mellitus, type 2) ([facility])   Tobacco abuse   HTN (hypertension)   S/P BKA (below knee amputation) unilateral, left ([place])   Dyslipidemia   Stage 3a chronic kidney disease ([place])   Surgeries: Procedure(s): LEFT BELOW KNEE AMPUTATION on 12/10/2020    Consultants:   Discharged Condition: Improved  Hospital Course: Todd Mccoy is an 50 y.o. male who was admitted 12/10/2020 with a chief complaint of left foot osteomyelitis with a final diagnosis of Abscess, Osteomyelitis Left Foot.  Patient was brought to the operating room on 12/10/2020 and underwent Procedure(s): LEFT BELOW KNEE AMPUTATION.    Patient was given perioperative antibiotics:  Anti-infectives (From admission, onward)    Start     Dose/Rate Route Frequency Ordered Stop   12/10/20 2000  cefTRIAXone (ROCEPHIN) 2 g in sodium chloride 0.9 % 100 mL IVPB       See Hyperspace for full Linked Orders Report.   2 g 200 mL/hr over 30 Minutes Intravenous Every 24 hours 12/10/20 1853 12/11/20 2359   12/10/20 2000  metroNIDAZOLE (FLAGYL) IVPB 500 mg       See Hyperspace for full Linked Orders Report.   500 mg 100 mL/hr over 60 Minutes Intravenous Every 8 hours 12/10/20 1853 12/11/20 2359   12/10/20 1715  ceFAZolin (ANCEF) IVPB 2g/100 mL premix  Status:  Discontinued        2 g 200 mL/hr over 30 Minutes Intravenous Every 8 hours 12/10/20 1711 12/10/20 1853   12/10/20 1245  vancomycin (VANCOREADY) IVPB 1000 mg/200 mL        1,000 mg 200 mL/hr over 60 Minutes Intravenous  Once 12/10/20 1241 12/10/20 1350   12/10/20 1135  vancomycin (VANCOCIN) 1-5 GM/200ML-% IVPB       Note to Pharmacy: Renda Rolls   : cabinet override      12/10/20 1135 12/10/20 1247   12/10/20 1130  ceFAZolin (ANCEF) IVPB 2g/100 mL premix        2 g 200 mL/hr over 30 Minutes Intravenous On call to O.R. 12/10/20 1122 12/10/20 1322      .  Patient was given sequential compression devices, early ambulation, and aspirin for DVT prophylaxis.  Recent vital signs: Patient Vitals for the past 24 hrs:  BP Temp Temp src Pulse Resp SpO2  12/17/20 0736 (!) 160/93 -- -- -- -- --  12/17/20 0414 (!) 168/93 98.3 F (36.8 C) Oral 100 20 96 %  12/16/20 2157 (!) 162/94 98.1 F (36.7 C) Axillary (!) 102 18 --  12/16/20 1237 (!) 168/97 -- -- 88 18 98 %  .  Recent laboratory studies: DG Abd 1 View  Result Date: 12/16/2020 CLINICAL DATA:  50 year old male with abdominal distension, constipation. EXAM: ABDOMEN - 1 VIEW COMPARISON:  Abdominal radiographs 12/14/2020 and earlier. Chest radiographs 12/15/2020. FINDINGS: Abdominal and pelvis radiographs at 0947 hours. Non obstructed bowel gas pattern. Moderate volume of retained stool in the large bowel has not significantly changed. Other abdominal and pelvic visceral contours appear normal. No osseous abnormality identified. Lung bases are stable since yesterday. IMPRESSION: Nonobstructed bowel-gas pattern moderate volume of retained stool throughout the colon. Electronically Signed   By: Genevie Ann M.D.   On: 12/16/2020 09:58    Discharge Medications:   Allergies as of 12/17/2020       Reactions   Amlodipine Swelling   BLE edema  Medication List     STOP taking these medications    doxycycline 100 MG tablet Commonly known as: VIBRA-TABS   nitroGLYCERIN 0.2 mg/hr patch Commonly known as: NITRODUR - Dosed in mg/24 hr   pantoprazole 40 MG tablet Commonly known as: PROTONIX   pentoxifylline 400 MG CR tablet Commonly known as: TRENTAL       TAKE these medications    ascorbic acid 1000 MG tablet Commonly known as: VITAMIN C Take 1 tablet (1,000 mg total) by mouth daily.   docusate sodium 100 MG capsule Commonly known as: COLACE Take 1 capsule (100 mg total) by mouth daily.   gabapentin 300 MG capsule Commonly known as: NEURONTIN Take 1 capsule (300 mg total) by  mouth at bedtime.   metFORMIN 500 MG 24 hr tablet Commonly known as: GLUCOPHAGE-XR TAKE 1 TABLET (500 MG TOTAL) BY MOUTH 2 (TWO) TIMES DAILY.   oxyCODONE 5 MG immediate release tablet Commonly known as: Oxy IR/ROXICODONE Take 1-2 tablets (5-10 mg total) by mouth every 4 (four) hours as needed for moderate pain (pain score 4-6). What changed:  how much to take reasons to take this   Safety Insulin Syringes 30G X 1/2" 1 ML Misc Generic drug: Insulin Syringe-Needle U-100 35 Units by Does not apply route 2 (two) times daily before a meal.   TRUEplus Pen Needles 32G X 4 MM Misc Generic drug: Insulin Pen Needle USE TO INJECT INSULIN DAILY.   zinc sulfate 220 (50 Zn) MG capsule Take 1 capsule (220 mg total) by mouth daily.       ASK your doctor about these medications    atorvastatin 20 MG tablet Commonly known as: LIPITOR Take 1 tablet (20 mg total) by mouth daily.   Lantus SoloStar 100 UNIT/ML Solostar Pen Generic drug: insulin glargine Inject 35 Units into the skin at bedtime.   losartan-hydrochlorothiazide 100-25 MG tablet Commonly known as: HYZAAR TAKE 1 TABLET BY MOUTH DAILY.               Durable Medical Equipment  (From admission, onward)           Start     Ordered   12/15/20 1135  For home use only DME lightweight manual wheelchair with seat cushion  Once       Comments: Patient suffers from Left BKA,which impairs their ability to perform daily activities like ambulating in the home.  A walker will not resolve  issue with performing activities of daily living. A wheelchair will allow patient to safely perform daily activities. Patient is not able to propel themselves in the home using a standard weight wheelchair due to general weakness. Patient can self propel in the lightweight wheelchair. Length of need Lifetime. Accessories: elevating leg rests (ELRs), wheel locks, extensions and anti-tippers.   12/15/20 1137            Diagnostic Studies:  DG Abd 1 View  Result Date: 12/16/2020 CLINICAL DATA:  50 year old male with abdominal distension, constipation. EXAM: ABDOMEN - 1 VIEW COMPARISON:  Abdominal radiographs 12/14/2020 and earlier. Chest radiographs 12/15/2020. FINDINGS: Abdominal and pelvis radiographs at 0947 hours. Non obstructed bowel gas pattern. Moderate volume of retained stool in the large bowel has not significantly changed. Other abdominal and pelvic visceral contours appear normal. No osseous abnormality identified. Lung bases are stable since yesterday. IMPRESSION: Nonobstructed bowel-gas pattern moderate volume of retained stool throughout the colon. Electronically Signed   By: Genevie Ann M.D.   On: 12/16/2020 09:58   US  RENAL  Result Date: 12/12/2020 CLINICAL DATA:  Acute renal injury. EXAM: RENAL / URINARY TRACT ULTRASOUND COMPLETE COMPARISON:  CT abdomen pelvis 09/07/2014 FINDINGS: Right Kidney: Renal measurements: 12.6 x 5 x 6 cm = volume: 198 mL. Echogenicity within normal limits. No mass or hydronephrosis visualized. Left Kidney: Renal measurements: 12.8 x 5.3 x 5.4 cm = volume: 189 mL. Echogenicity within normal limits. No mass or hydronephrosis visualized. Urinary bladder: Thickened bladder wall. Otherwise appears normal for degree of bladder distention. Other: At least small volume ascites.  Right pleural effusion. IMPRESSION: 1. Thickened urinary bladder wall. Correlate with urinalysis for infection. 2. At least small volume ascites. 3. Right pleural effusion. Electronically Signed   By: Iven Finn M.D.   On: 12/12/2020 05:01   DG Chest Port 1 View  Result Date: 12/15/2020 CLINICAL DATA:  Shortness of breath EXAM: PORTABLE CHEST 1 VIEW COMPARISON:  Renal ultrasound 12/11/2020 FINDINGS: Enlargement of the cardiopericardial silhouette. Fill like opacity in the right lung base with significant obscuration of the right cardiac margin. Mild patchy airspace opacity at the left lung base and left retrocardiac region as well.  No pneumothorax. Mild vascular congestion without overt edema. No acute osseous abnormality. IMPRESSION: 1. Cardiomegaly and pulmonary vascular congestion without overt edema. 2. Right basilar airspace opacity most consistent with a moderate layering right pleural effusion and associated right middle and lower lobe atelectasis versus infiltrate. 3. Nonspecific patchy airspace opacity in the left base also likely reflects atelectasis and/or infiltrate. Electronically Signed   By: Jacqulynn Cadet M.D.   On: 12/15/2020 07:51   DG Chest Port 1 View  Result Date: 12/11/2020 CLINICAL DATA:  Shortness of breath. EXAM: PORTABLE CHEST 1 VIEW COMPARISON:  12/10/2020. FINDINGS: Cardiomegaly with mild pulmonary venous congestion. Low lung volumes with bibasilar atelectasis. Bibasilar infiltrates/edema cannot be excluded. Small right pleural effusion. No pneumothorax. IMPRESSION: Cardiomegaly with mild pulmonary venous congestion. 2. Low lung volumes with bibasilar atelectasis. Bibasilar infiltrates/edema cannot be excluded. Small right pleural effusion. Electronically Signed   By: Marcello Moores  Register   On: 12/11/2020 08:06   DG CHEST PORT 1 VIEW  Result Date: 12/10/2020 CLINICAL DATA:  Shortness of breath.  Preoperative study. EXAM: PORTABLE CHEST 1 VIEW COMPARISON:  Chest x-ray 09/06/2014. FINDINGS: Mediastinum and hilar structures normal. Cardiomegaly. Mild pulmonary venous congestion. Right base infiltrate/edema. Small right pleural effusion. IMPRESSION: 1.  Cardiomegaly.  Mild pulmonary venous congestion. 2.  Right base infiltrate/edema.  Small right pleural effusion. These results will be called to the ordering clinician or representative by the Radiologist Assistant, and communication documented in the PACS or [employer]. Electronically Signed   By: Marcello Moores  Register   On: 12/10/2020 12:01   DG Abd Portable 1V  Result Date: 12/14/2020 CLINICAL DATA:  Abdominal distension. EXAM: PORTABLE ABDOMEN - 1 VIEW  COMPARISON:  None. FINDINGS: Or imaging quality due to supine film and patient body habitus. Moderate colonic stool burden. No high-grade obstructive bowel gas pattern. No visible suspicious abdominal calcifications. No acute or worrisome osseous abnormalities. IMPRESSION: Suboptimal imaging quality due to portable supine film and patient body habitus. Moderate stool burden. No convincing high-grade obstruction or other acute abdominal radiographic abnormality Electronically Signed   By: Lovena Le M.D.   On: 12/14/2020 05:57   XR Knee 1-2 Views Left  Result Date: 12/08/2020 2 view radiographs of the left knee shows progressive collapse of the medial tibial plateau fracture.  XR Foot 2 Views Left  Result Date: 12/05/2020 2 views of his left foot demonstrate  postsurgical changes consistent with fifth ray amputation.  He also has some changes within the fourth metatarsal and concerns for further osteomyelitis   Patient benefited maximally from their hospital stay and there were no complications.     Disposition: Discharge disposition: 01-Home or Self Care      Discharge Instructions     Call MD / Call 911   Complete by: As directed    If you experience chest pain or shortness of breath, CALL 911 and be transported to the hospital emergency room.  If you develope a fever above 101 F, pus (white drainage) or increased drainage or redness at the wound, or calf pain, call your surgeon's office.   Constipation Prevention   Complete by: As directed    Drink plenty of fluids.  Prune juice may be helpful.  You may use a stool softener, such as Colace (over the counter) 100 mg twice a day.  Use MiraLax (over the counter) for constipation as needed.   Diet - low sodium heart healthy   Complete by: As directed    Discharge instructions   Complete by: As directed    Call office if wound vac alarms or stops working   Increase activity slowly as tolerated   Complete by: As directed    Negative  Pressure Wound Therapy - Incisional   Complete by: As directed    Show patient how to attach vac   Post-operative opioid taper instructions:   Complete by: As directed    POST-OPERATIVE OPIOID TAPER INSTRUCTIONS: It is important to wean off of your opioid medication as soon as possible. If you do not need pain medication after your surgery it is ok to stop day one. Opioids include: Codeine, Hydrocodone(Norco, Vicodin), Oxycodone(Percocet, oxycontin) and hydromorphone amongst others.  Long term and even short term use of opiods can cause: Increased pain response Dependence Constipation Depression Respiratory depression And more.  Withdrawal symptoms can include Flu like symptoms Nausea, vomiting And more Techniques to manage these symptoms Hydrate well Eat regular healthy meals Stay active Use relaxation techniques(deep breathing, meditating, yoga) Do Not substitute Alcohol to help with tapering If you have been on opioids for less than two weeks and do not have pain than it is ok to stop all together.  Plan to wean off of opioids This plan should start within one week post op of your joint replacement. Maintain the same interval or time between taking each dose and first decrease the dose.  Cut the total daily intake of opioids by one tablet each day Next start to increase the time between doses. The last dose that should be eliminated is the evening dose.          Follow-up Information     Suzan Slick, NP In 1 week.   Specialty: Orthopedic Surgery Contact information: [place] [address] [place] Follow up on 01/21/2021.   Why: @ 1:30 pm for appointment  with Dr. Denita Lung information: [place] [phone]        [facility] Follow up.   Specialty: [place] Why: A representative from [facility]  will contact you to arrange start dte and time for your therapy. Contact information: [place] [address] [phone]  Signed: Bevely Palmer Shatara Stanek 12/17/2020, 8:09 AM

## 2020-12-17 NOTE — Progress Notes (Signed)
Patient is status post transtibial amputation.  Doing better today had bowel movement x2.  Plan for discharge home.   Vital signs stable he has a functioning wound VAC but does not have any green checks.  No leaks noted.  No blockage noted.  The wound VAC is suctioning.  Plan for discharge to home today.  We will call in pain medication orders.  Nursing is comfortable with transitioning to [facility] and has done this before I have told him to contact me if any concerns

## 2020-12-17 NOTE — Progress Notes (Signed)
Nutrition Follow-up  DOCUMENTATION CODES:   Not applicable  INTERVENTION:   -D/c Boost Plus -Continue 1000 mg vitamin C daily -Continue 220 mg zinc sulfate daily -Continue 1 packet Juven BID, each packet provides 95 calories, 2.5 grams of protein (collagen), and 9.8 grams of carbohydrate (3 grams sugar); also contains 7 grams of L-arginine and L-glutamine, 300 mg vitamin C, 15 mg vitamin E, 1.2 mcg vitamin B-12, 9.5 mg zinc, 200 mg calcium, and 1.5 g  Calcium Beta-hydroxy-Beta-methylbutyrate to support wound healing -Continue Ensure Max po daily, each supplement provides 150 kcal and 30 grams of protein -Double protein portions at meals  NUTRITION DIAGNOSIS:   Increased nutrient needs related to wound healing, post-op healing as evidenced by estimated needs.  Ongoing  GOAL:   Patient will meet greater than or equal to 90% of their needs  Progressing  MONITOR:   PO intake, Supplement acceptance, Labs, Weight trends, Skin, I & O's  REASON FOR ASSESSMENT:   Consult Assessment of nutrition requirement/status, Wound healing  ASSESSMENT:   Todd Mccoy is a 50 y.o. male with medical history significant of HTN; stage 3a CKD; and DM presenting for BKA.  He was previously admitted from 5/11-14 for sepsis associated with L wound infection and underwent L 5th ray amputation.  Since discharge, he has had progressive gangrenous changes and so the plan was made for L transtibial amputation - which was performed today.  His prior concern at the time of my evaluation is periodic constipation resulting in GERD symptoms; he wants a good bowel regimen.  He reports reasonable glycemic control at home.   He is continuing to smoke cigarettes but declines patch, reporting that he knows he has to stop now.  6/8- s/p Transtibial amputation Application of Prevena wound VAC  Reviewed I/O's: +529 ml x 24 hours and +5.9 L since admission  UOP: 275 ml x 24 hours  Pt sleeping soundly at time  of visit. He did not arouse to name being called.   Pt with very good appetite. Noted meal completion 90-100%. Pt taking Ensure Max and Juven supplements. Noted Boost Plus ordered by MD, which pt is also consuming- suspect this may be contributing to persistent hypoglycemia.   Per chart review, plan to d/c home tomorrow with home health services.  Medications reviewed and include vitamin C, colace, ferrous sulfate, folic acid, zinc sulfate and lactated ringers infusion @ 75 ml/hr.   Labs reviewed: Na: 31, CBGS: 243-335 (inpatient orders for glycemic control are 0-9 units insulin aspart TID with meals, 3 units insulin aspart TID with meals, and 25 units insulin glargine daily).    Diet Order:   Diet Order             Diet - low sodium heart healthy           Diet Carb Modified Fluid consistency: Thin; Room service appropriate? Yes  Diet effective now                   EDUCATION NEEDS:   Education needs have been addressed  Skin:  Skin Assessment: Skin Integrity Issues: Skin Integrity Issues:: Wound VAC Wound Vac: lt BKA  Last BM:  12/17/20  Height:   Ht Readings from Last 1 Encounters:  12/10/20 '5\' 10"'$  (1.778 m)    Weight:   Wt Readings from Last 1 Encounters:  12/10/20 79.4 kg    Ideal Body Weight:  70.6 kg  BMI:  Body mass index is 25.11 kg/m.  Estimated  Nutritional Needs:   Kcal:  2100-2300  Protein:  125-140 grams  Fluid:  > 2 L    Loistine Chance, RD, LDN, [place] Registered Dietitian II Certified Diabetes Care and Education Specialist Please refer to [facility] for RD and/or RD on-call/weekend/after hours pager

## 2020-12-18 LAB — COMPREHENSIVE METABOLIC PANEL
ALT: 11 U/L (ref 0–44)
AST: 17 U/L (ref 15–41)
Albumin: 1.3 g/dL — ABNORMAL LOW (ref 3.5–5.0)
Alkaline Phosphatase: 81 U/L (ref 38–126)
Anion gap: 4 — ABNORMAL LOW (ref 5–15)
BUN: 108 mg/dL — ABNORMAL HIGH (ref 6–20)
CO2: 27 mmol/L (ref 22–32)
Calcium: 8 mg/dL — ABNORMAL LOW (ref 8.9–10.3)
Chloride: 100 mmol/L (ref 98–111)
Creatinine, Ser: 3.15 mg/dL — ABNORMAL HIGH (ref 0.61–1.24)
GFR, Estimated: 23 mL/min — ABNORMAL LOW (ref >60)
Glucose, Bld: 150 mg/dL — ABNORMAL HIGH (ref 70–99)
Potassium: 5.6 mmol/L — ABNORMAL HIGH (ref 3.5–5.1)
Sodium: 131 mmol/L — ABNORMAL LOW (ref 135–145)
Total Bilirubin: 0.5 mg/dL (ref 0.3–1.2)
Total Protein: 6.5 g/dL (ref 6.5–8.1)

## 2020-12-18 LAB — URINALYSIS, ROUTINE W REFLEX MICROSCOPIC
Bilirubin Urine: NEGATIVE
Glucose, UA: NEGATIVE mg/dL
Ketones, ur: NEGATIVE mg/dL
Nitrite: NEGATIVE
Protein, ur: 100 mg/dL — AB
Specific Gravity, Urine: 1.014 (ref 1.005–1.030)
pH: 5 (ref 5.0–8.0)

## 2020-12-18 LAB — CBC WITH DIFFERENTIAL/PLATELET
Abs Immature Granulocytes: 0.13 10*3/uL — ABNORMAL HIGH (ref 0.00–0.07)
Basophils Absolute: 0 10*3/uL (ref 0.0–0.1)
Basophils Relative: 0 %
Eosinophils Absolute: 0.1 10*3/uL (ref 0.0–0.5)
Eosinophils Relative: 1 %
HCT: 24.8 % — ABNORMAL LOW (ref 39.0–52.0)
Hemoglobin: 7.4 g/dL — ABNORMAL LOW (ref 13.0–17.0)
Immature Granulocytes: 1 %
Lymphocytes Relative: 14 %
Lymphs Abs: 1.5 10*3/uL (ref 0.7–4.0)
MCH: 25.3 pg — ABNORMAL LOW (ref 26.0–34.0)
MCHC: 29.8 g/dL — ABNORMAL LOW (ref 30.0–36.0)
MCV: 84.6 fL (ref 80.0–100.0)
Monocytes Absolute: 0.7 10*3/uL (ref 0.1–1.0)
Monocytes Relative: 6 %
Neutro Abs: 8.6 10*3/uL — ABNORMAL HIGH (ref 1.7–7.7)
Neutrophils Relative %: 78 %
Platelets: 364 10*3/uL (ref 150–400)
RBC: 2.93 MIL/uL — ABNORMAL LOW (ref 4.22–5.81)
RDW: 20.2 % — ABNORMAL HIGH (ref 11.5–15.5)
WBC: 10.8 10*3/uL — ABNORMAL HIGH (ref 4.0–10.5)
nRBC: 0 % (ref 0.0–0.2)

## 2020-12-18 LAB — PROTEIN / CREATININE RATIO, URINE
Creatinine, Urine: 115.81 mg/dL
Protein Creatinine Ratio: 0.96 mg/mg{Cre} — ABNORMAL HIGH (ref 0.00–0.15)
Total Protein, Urine: 111 mg/dL

## 2020-12-18 LAB — BRAIN NATRIURETIC PEPTIDE: B Natriuretic Peptide: 1329.4 pg/mL — ABNORMAL HIGH (ref 0.0–100.0)

## 2020-12-18 LAB — GLUCOSE, CAPILLARY
Glucose-Capillary: 155 mg/dL — ABNORMAL HIGH (ref 70–99)
Glucose-Capillary: 111 mg/dL — ABNORMAL HIGH (ref 70–99)
Glucose-Capillary: 108 mg/dL — ABNORMAL HIGH (ref 70–99)
Glucose-Capillary: 58 mg/dL — ABNORMAL LOW (ref 70–99)
Glucose-Capillary: 70 mg/dL (ref 70–99)

## 2020-12-18 LAB — IRON AND TIBC
Iron: 23 ug/dL — ABNORMAL LOW (ref 45–182)
Saturation Ratios: 13 % — ABNORMAL LOW (ref 17.9–39.5)
TIBC: 172 ug/dL — ABNORMAL LOW (ref 250–450)
UIBC: 149 ug/dL

## 2020-12-18 LAB — FOLATE: Folate: 15.9 ng/mL (ref >5.9)

## 2020-12-18 LAB — VITAMIN B12: Vitamin B-12: 849 pg/mL (ref 180–914)

## 2020-12-18 MED ORDER — MAGNESIUM SULFATE 2 GM/50ML IV SOLN: 2.0000 g | Freq: Once | INTRAVENOUS | Status: DC

## 2020-12-18 MED ORDER — MAGNESIUM CITRATE PO SOLN
1.0000 | Freq: Once | ORAL | Status: AC
  Administered 2020-12-18: 1 via ORAL
  Filled 2020-12-18: qty 296

## 2020-12-18 MED ORDER — SIMETHICONE 80 MG PO CHEW
80.0000 mg | CHEWABLE_TABLET | Freq: Two times a day (BID) | ORAL | Status: DC | PRN
  Administered 2020-12-18: 80 mg via ORAL
  Filled 2020-12-18: qty 1

## 2020-12-18 MED ORDER — SODIUM ZIRCONIUM CYCLOSILICATE 10 G PO PACK
10.0000 g | PACK | Freq: Once | ORAL | Status: AC
  Administered 2020-12-18: 10 g via ORAL
  Filled 2020-12-18: qty 1

## 2020-12-18 NOTE — Progress Notes (Signed)
PROGRESS NOTE                                                                                                                                                                                                             Patient Demographics:    Todd Mccoy, is a 50 y.o. male, DOB - 10/15/1970, [id number]  Outpatient Primary MD for the patient is [name], MD    LOS - 8  Admit date - 12/10/2020    CC - L.Foot infection     Brief Narrative (HPI from H&P) Todd Mccoy is a 50 y.o. 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   Subjective:   The patient was seen and examined this morning, remained stable no acute distress.  Reporting of no bowel movement yet. No further refused overnight   Assessment  & Plan :     Recurrent left foot infection.  S/p BKA.  Defer management to primary team orthopedics, stump site under bandage. ABX stopped.  Case discussed with orthopedics bedside on 12/13/20.  Remained stable   2.  AKI on CKD stage IIIa.  Baseline creatinine around 1.7, he appears severely dehydrated, FeNa still 0.3, IVF + PRBCs as needed, slowly improving, no SOB or rales.so far 6.5 Lits +ve.  -Nephrology Dr. Hollie Salk consulted for further evaluation recommendation (also close follow-up as an outpatient) - Renal function gradually improving if improves further for another day likely stable for discharge on 12/18/2020.  3.  Dyslipidemia.  On statin continue.  Stable  4.  Tobacco abuse.  Counseled to quit.  5.  Perioperative blood loss related anemia.  Received 1 unit of packed RBC earlier this admission receiving second unit on 12/12/2020, no signs of ongoing bleeding, on twice daily PPI,  will monitor stools. -Hemoglobin remained stable at 7.4 x 2 days  6. Constipation - on aggressive B. Regimen ( miralax, colace, dulcolax, Movantik, MOM, Go Lytely, lactulose) + enema PRN.  Now starting to have bowel movements continue bowel regimen for another day. Initiated magnesium citrate 1 bottle  7. DM type II.  Currently on SSI, increased Lantus dose.  Lab  Results  Component Value Date   HGBA1C 8.6 (H) 11/12/2020    CBG (last 3)  Recent Labs    12/17/20 1631 12/17/20 2042 12/18/20 0835  GLUCAP 213* 147* 155*         Condition - Fair  Family Communication  : Per Primary team - Ortho  Code Status :  Full  Consults  :  TRH consulting  PUD Prophylaxis :    Procedures  :     L BKA  Renal US - Non acute      Disposition Plan  :    Status is: Inpatient  Remains inpatient appropriate because:IV treatments appropriate due to intensity of illness or inability to take PO  Dispo: The patient is from: Home              Anticipated d/c is to: Clear to be discharged with home health and close follow-up with orthopedics              Patient currently is not medically stable to d/c.   Difficult to place patient No   DVT Prophylaxis  : Heparin added 12/11/2020.  heparin injection 5,000 Units Start: 12/11/20 1400 SCD's Start: 12/10/20 1712   Lab Results  Component Value Date   PLT 364 12/18/2020    Diet :  Diet Order             Diet - low sodium heart healthy           Diet - low sodium heart healthy           Diet Carb Modified Fluid consistency: Thin; Room service appropriate? Yes  Diet effective now                    Inpatient Medications  Scheduled Meds:  vitamin C  1,000 mg Oral Daily   atorvastatin  20 mg Oral Daily   carvedilol  12.5 mg Oral BID WC   docusate sodium  200 mg Oral BID   ferrous sulfate  325 mg Oral TID WC   folic acid  1 mg Oral Daily   gabapentin  300 mg Oral QHS   heparin injection (subcutaneous)  5,000 Units  Subcutaneous Q8H   insulin aspart  0-5 Units Subcutaneous QHS   insulin aspart  0-9 Units Subcutaneous TID WC   insulin aspart  3 Units Subcutaneous TID WC   insulin glargine  25 Units Subcutaneous Daily   isosorbide mononitrate  30 mg Oral Daily   magnesium citrate  1 Bottle Oral Once   multivitamin with minerals  1 tablet Oral Daily   nutrition supplement (JUVEN)  1 packet Oral BID BM   pantoprazole  40 mg Oral BID   Ensure Max Protein  11 oz Oral QHS   sodium zirconium cyclosilicate  10 g Oral Once   tamsulosin  0.4 mg Oral Daily   zinc sulfate  220 mg Oral Daily   Continuous Infusions:   PRN Meds:.acetaminophen, alum & mag hydroxide-simeth, guaiFENesin-dextromethorphan, hydrALAZINE, naphazoline-glycerin, ondansetron, oxyCODONE, phenol, simethicone, sodium phosphate  Antibiotics  :    Anti-infectives (From admission, onward)    Start     Dose/Rate Route Frequency Ordered Stop   12/10/20 2000  cefTRIAXone (ROCEPHIN) 2 g in sodium chloride 0.9 % 100 mL IVPB       See Hyperspace for full Linked Orders Report.   2 g 200 mL/hr over 30 Minutes Intravenous Every 24 hours 12/10/20 1853 12/11/20 2359   12/10/20 2000  metroNIDAZOLE (FLAGYL) IVPB 500 mg       See Hyperspace for full Linked Orders Report.   500 mg 100 mL/hr over 60 Minutes Intravenous Every 8 hours 12/10/20 1853 12/11/20 2359   12/10/20 1715  ceFAZolin (ANCEF) IVPB 2g/100 mL premix  Status:  Discontinued        2 g 200 mL/hr over 30 Minutes Intravenous Every 8 hours 12/10/20 1711 12/10/20 1853   12/10/20 1245  vancomycin (VANCOREADY) IVPB 1000 mg/200 mL        1,000 mg 200 mL/hr over 60 Minutes Intravenous  Once 12/10/20 1241 12/10/20 1350   12/10/20 1135  vancomycin (VANCOCIN) 1-5 GM/200ML-% IVPB       Note to Pharmacy: Renda Rolls   : cabinet override      12/10/20 1135 12/10/20 1247   12/10/20 1130  ceFAZolin (ANCEF) IVPB 2g/100 mL premix        2 g 200 mL/hr over 30 Minutes Intravenous On call to O.R.  12/10/20 1122 12/10/20 1322        Time Spent in minutes  30   Deatra James M.D on 12/18/2020 at 11:32 AM  To page go to [email or web address]   [facility] -  Office  [phone]   See all Orders from today for further details    Objective:   Vitals:   12/17/20 1629 12/17/20 2040 12/18/20 0515 12/18/20 0856  BP: (!) 161/97 (!) 160/95 138/78   Pulse: 96 100 97   Resp: '20  20 20  '$ Temp: 97.8 F (36.6 C)  98 F (36.7 C)   TempSrc: Oral  Oral   SpO2: 99% 100% 97%   Weight:      Height:        Wt Readings from Last 3 Encounters:  12/10/20 79.4 kg  11/12/20 89.8 kg  10/22/20 89.8 kg     Intake/Output Summary (Last 24 hours) at 12/18/2020 1132 Last data filed at 12/18/2020 0900 Gross per 24 hour  Intake 360 ml  Output 700 ml  Net -340 ml     Physical Exam  Awake Alert, No new F.N deficits, Normal affect [place].AT,PERRAL Supple Neck,No JVD, No cervical lymphadenopathy appriciated.  Symmetrical Chest wall movement, Good air movement bilaterally, CTAB RRR,No Gallops, Rubs or new Murmurs, No Parasternal Heave +ve B.Sounds, Abd Soft, No tenderness, No organomegaly appriciated, No rebound - guarding or rigidity. L. BKA    Data Review:    CBC Recent Labs  Lab 12/14/20 0104 12/15/20 0204 12/16/20 0100 12/17/20 0539 12/18/20 0057  WBC 15.3* 14.2* 12.3* 11.5* 10.8*  HGB 8.0* 8.1* 7.7* 7.4* 7.4*  HCT 25.6* 26.1* 24.5* 23.9* 24.8*  PLT 544* 521* 461* 482* 364  MCV 79.3* 80.3 79.5* 80.5 84.6  MCH 24.8* 24.9* 25.0* 24.9* 25.3*  MCHC 31.3 31.0 31.4 31.0 29.8*  RDW 19.2* 19.1* 19.4* 19.8* 20.2*  LYMPHSABS 2.1 1.7 1.7 1.6 1.5  MONOABS 0.9 0.8 0.9 0.7 0.7  EOSABS 0.2 0.2 0.2 0.1 0.1  BASOSABS 0.0 0.0 0.0 0.0 0.0    Recent Labs  Lab 12/12/20 0150 12/13/20 0057 12/14/20 0104 12/15/20 0204 12/16/20 0100 12/17/20 0539 12/18/20 0057  NA 132* 131* 128* 129* 129* 131* 131*  K 4.0 4.4 4.6 4.9 4.9 5.0 5.6*  CL 95* 98 95* 96* 97* 96* 100  CO2 '28 25 26 27  24 26 27  '$ GLUCOSE 120* 263* 235* 236* 222* 271* 150*  BUN 61* 74* 82* 96* 103* 102* 108*  CREATININE 3.36* 3.24* 3.10* 3.29* 3.09*  3.08* 3.15*  CALCIUM 7.7* 7.4* 7.6* 7.8* 7.8* 8.2* 8.0*  AST 13* 13* 13* 14* '16 17 17  '$ ALT '7 7 7 8 10 11 11  '$ ALKPHOS 94 84 79 80 77 74 81  BILITOT 0.6 0.5 0.5 0.4 0.4 0.2* 0.5  ALBUMIN 1.3* 1.2* 1.3* 1.3* 1.3* 1.3* 1.3*  MG 2.0 1.9 2.0  --   --   --   --   PROCALCITON 0.71 0.50  --   --   --   --   --   BNP 939.6* 975.1* 1,374.8* 1,520.6* 1,574.3* 1,835.4* 1,329.4*    ------------------------------------------------------------------------------------------------------------------ No results for input(s): CHOL, HDL, LDLCALC, TRIG, CHOLHDL, LDLDIRECT in the last 72 hours.  Lab Results  Component Value Date   HGBA1C 8.6 (H) 11/12/2020   ------------------------------------------------------------------------------------------------------------------ No results for input(s): TSH, T4TOTAL, T3FREE, THYROIDAB in the last 72 hours.  Invalid input(s): FREET3  Cardiac Enzymes No results for input(s): CKMB, TROPONINI, MYOGLOBIN in the last 168 hours.  Invalid input(s): CK ------------------------------------------------------------------------------------------------------------------    Component Value Date/Time   BNP 1,329.4 (H) 12/18/2020 0057    Micro Results Recent Results (from the past 240 hour(s))  SARS Coronavirus 2 by RT PCR (hospital order, performed in [facility] hospital lab) Nasopharyngeal Nasopharyngeal Swab     Status: None   Collection Time: 12/10/20 11:25 AM   Specimen: Nasopharyngeal Swab  Result Value Ref Range Status   SARS Coronavirus 2 NEGATIVE NEGATIVE Final    Comment: Performed at [facility], [place] [address] [place], [place] [address]  Culture, blood (routine x 2)     Status: None   Collection Time: 12/10/20  7:01 PM   Specimen: BLOOD  Result Value Ref Range Status   Specimen Description BLOOD SITE NOT SPECIFIED   Final   Special Requests   Final    BOTTLES DRAWN AEROBIC AND ANAEROBIC Blood Culture adequate volume   Culture   Final    NO GROWTH 5 DAYS Performed at [facility], [place] [address], [place], [place] [address]    Report Status 12/15/2020 FINAL  Final  Culture, blood (routine x 2)     Status: None   Collection Time: 12/10/20  7:01 PM   Specimen: BLOOD  Result Value Ref Range Status   Specimen Description BLOOD SITE NOT SPECIFIED  Final   Special Requests   Final    BOTTLES DRAWN AEROBIC ONLY Blood Culture results may not be optimal due to an inadequate volume of blood received in culture bottles   Culture   Final    NO GROWTH 5 DAYS Performed at [facility], [place] [address] [place], [place] [address]    Report Status 12/15/2020 FINAL  Final    Radiology Reports DG Abd 1 View  Result Date: 12/16/2020 CLINICAL DATA:  50 year old male with abdominal distension, constipation. EXAM: ABDOMEN - 1 VIEW COMPARISON:  Abdominal radiographs 12/14/2020 and earlier. Chest radiographs 12/15/2020. FINDINGS: Abdominal and pelvis radiographs at 0947 hours. Non obstructed bowel gas pattern. Moderate volume of retained stool in the large bowel has not significantly changed. Other abdominal and pelvic visceral contours appear normal. No osseous abnormality identified. Lung bases are stable since yesterday. IMPRESSION: Nonobstructed bowel-gas pattern moderate volume of retained stool throughout the colon. Electronically Signed   By: Genevie Ann M.D.   On: 12/16/2020 09:58   US RENAL  Result Date: 12/12/2020 CLINICAL DATA:  Acute renal injury. EXAM: RENAL / URINARY TRACT ULTRASOUND COMPLETE COMPARISON:  CT abdomen pelvis  09/07/2014 FINDINGS: Right Kidney: Renal measurements: 12.6 x 5 x 6 cm = volume: 198 mL. Echogenicity within normal limits. No mass or hydronephrosis visualized. Left Kidney: Renal measurements: 12.8 x 5.3 x 5.4 cm = volume: 189 mL. Echogenicity within normal limits. No mass or  hydronephrosis visualized. Urinary bladder: Thickened bladder wall. Otherwise appears normal for degree of bladder distention. Other: At least small volume ascites.  Right pleural effusion. IMPRESSION: 1. Thickened urinary bladder wall. Correlate with urinalysis for infection. 2. At least small volume ascites. 3. Right pleural effusion. Electronically Signed   By: Iven Finn M.D.   On: 12/12/2020 05:01   DG Chest Port 1 View  Result Date: 12/15/2020 CLINICAL DATA:  Shortness of breath EXAM: PORTABLE CHEST 1 VIEW COMPARISON:  Renal ultrasound 12/11/2020 FINDINGS: Enlargement of the cardiopericardial silhouette. Fill like opacity in the right lung base with significant obscuration of the right cardiac margin. Mild patchy airspace opacity at the left lung base and left retrocardiac region as well. No pneumothorax. Mild vascular congestion without overt edema. No acute osseous abnormality. IMPRESSION: 1. Cardiomegaly and pulmonary vascular congestion without overt edema. 2. Right basilar airspace opacity most consistent with a moderate layering right pleural effusion and associated right middle and lower lobe atelectasis versus infiltrate. 3. Nonspecific patchy airspace opacity in the left base also likely reflects atelectasis and/or infiltrate. Electronically Signed   By: Jacqulynn Cadet M.D.   On: 12/15/2020 07:51   DG Chest Port 1 View  Result Date: 12/11/2020 CLINICAL DATA:  Shortness of breath. EXAM: PORTABLE CHEST 1 VIEW COMPARISON:  12/10/2020. FINDINGS: Cardiomegaly with mild pulmonary venous congestion. Low lung volumes with bibasilar atelectasis. Bibasilar infiltrates/edema cannot be excluded. Small right pleural effusion. No pneumothorax. IMPRESSION: Cardiomegaly with mild pulmonary venous congestion. 2. Low lung volumes with bibasilar atelectasis. Bibasilar infiltrates/edema cannot be excluded. Small right pleural effusion. Electronically Signed   By: Marcello Moores  Register   On: 12/11/2020 08:06    DG CHEST PORT 1 VIEW  Result Date: 12/10/2020 CLINICAL DATA:  Shortness of breath.  Preoperative study. EXAM: PORTABLE CHEST 1 VIEW COMPARISON:  Chest x-ray 09/06/2014. FINDINGS: Mediastinum and hilar structures normal. Cardiomegaly. Mild pulmonary venous congestion. Right base infiltrate/edema. Small right pleural effusion. IMPRESSION: 1.  Cardiomegaly.  Mild pulmonary venous congestion. 2.  Right base infiltrate/edema.  Small right pleural effusion. These results will be called to the ordering clinician or representative by the Radiologist Assistant, and communication documented in the PACS or [employer]. Electronically Signed   By: Marcello Moores  Register   On: 12/10/2020 12:01   DG Abd Portable 1V  Result Date: 12/14/2020 CLINICAL DATA:  Abdominal distension. EXAM: PORTABLE ABDOMEN - 1 VIEW COMPARISON:  None. FINDINGS: Or imaging quality due to supine film and patient body habitus. Moderate colonic stool burden. No high-grade obstructive bowel gas pattern. No visible suspicious abdominal calcifications. No acute or worrisome osseous abnormalities. IMPRESSION: Suboptimal imaging quality due to portable supine film and patient body habitus. Moderate stool burden. No convincing high-grade obstruction or other acute abdominal radiographic abnormality Electronically Signed   By: Lovena Le M.D.   On: 12/14/2020 05:57   XR Knee 1-2 Views Left  Result Date: 12/08/2020 2 view radiographs of the left knee shows progressive collapse of the medial tibial plateau fracture.  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  He also has some changes within the fourth metatarsal and concerns for further osteomyelitis

## 2020-12-18 NOTE — Consult Note (Signed)
[facility]  HISTORY AND PHYSICAL  Todd Mccoy is an 50 y.o. male.    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  Baseline creatinine appears to be 1.5.  it has been ~3.0 since his hospitalization.  He has had osteomyelitis in the l foot since May 2022.  In this setting we are asked to see.    Looking back in the chart, has had sig hematuria since May.  Pt is unsure if he's seen blood in his urine but does note his urine is very dark since the amputation.  Initially was on vanc/ ceftriaxone/ flagyl but these have been discontinued.  No hypotensive episodes.  Has been on LR @ 75/ hr without improvement.    No myalgias/ arthralgias/ nosebleeds/ rashes.  Renal US 12/10/20 without hydro or stones.  Was on hyzaar prior to admission as well as doxy.  PMH: Past Medical History:  Diagnosis Date   Anemia    Anxiety    Diabetes mellitus without complication ([place])    Diabetic neuropathy ([place])    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[place]) 03/25/2020   pt states retinal eye damage- left worse than the right- recent visited MD    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[place]) 03/25/2020   pt states recent MD visit /left eye worse than right   Dyslipidemia 12/10/2020   Hypertension    Pneumonia    Stage 3a chronic kidney disease ([place]) 12/10/2020   PSH: Past Surgical History:  Procedure Laterality Date   ABSCESS DRAINAGE     neck   AMPUTATION Left 11/14/2020   Procedure: LEFT 5TH RAY AMPUTATION;  Surgeon: Newt Minion, MD;  Location: [facility];  Service: Orthopedics;  Laterality: Left;   AMPUTATION Left 12/10/2020   Procedure: LEFT BELOW KNEE AMPUTATION;  Surgeon: Newt Minion, MD;  Location: [facility];  Service: Orthopedics;  Laterality: Left;   AMPUTATION TOE Right 03/25/2020   Procedure: AMPUTATION TOE;  Surgeon: Evelina Bucy, DPM;  Location: [facility];  Service:  Podiatry;  Laterality: Right;   INCISION AND DRAINAGE ABSCESS Right 09/07/2014   Procedure: INCISION AND DRAINAGE ABSCESS RIGHT FLANK;  Surgeon: Jackolyn Confer, MD;  Location: [facility];  Service: General;  Laterality: Right;   LEG AMPUTATION BELOW KNEE Left 12/10/2020   SCROTUM EXPLORATION       Past Medical History:  Diagnosis Date   Anemia    Anxiety    Diabetes mellitus without complication ([place])    Diabetic neuropathy ([place])    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[place]) 03/25/2020   pt states retinal eye damage- left worse than the right- recent visited MD    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[place]) 03/25/2020   pt states recent MD visit /left eye worse than right   Dyslipidemia 12/10/2020   Hypertension    Pneumonia    Stage 3a chronic kidney disease ([place]) 12/10/2020    Medications:  Scheduled:  vitamin C  1,000 mg Oral Daily   atorvastatin  20 mg Oral Daily   carvedilol  12.5 mg Oral BID WC   docusate sodium  200 mg Oral BID   ferrous sulfate  325 mg Oral TID WC   folic acid  1 mg Oral Daily   gabapentin  300 mg Oral QHS   heparin injection (  subcutaneous)  5,000 Units Subcutaneous Q8H   insulin aspart  0-5 Units Subcutaneous QHS   insulin aspart  0-9 Units Subcutaneous TID WC   insulin aspart  3 Units Subcutaneous TID WC   insulin glargine  25 Units Subcutaneous Daily   isosorbide mononitrate  30 mg Oral Daily   magnesium citrate  1 Bottle Oral Once   multivitamin with minerals  1 tablet Oral Daily   nutrition supplement (JUVEN)  1 packet Oral BID BM   pantoprazole  40 mg Oral BID   Ensure Max Protein  11 oz Oral QHS   sodium zirconium cyclosilicate  10 g Oral Once   tamsulosin  0.4 mg Oral Daily   zinc sulfate  220 mg Oral Daily    Medications Prior to Admission  Medication Sig Dispense Refill   ascorbic acid (VITAMIN C) 1000 MG tablet Take 1 tablet (1,000 mg total) by mouth daily. 30 tablet 1   atorvastatin (LIPITOR) 20 MG tablet Take 1 tablet (20 mg total) by  mouth daily. (Patient taking differently: Take 20 mg by mouth in the morning.) 30 tablet 3   doxycycline (VIBRA-TABS) 100 MG tablet Take 1 tablet (100 mg total) by mouth 2 (two) times daily. 20 tablet 0   gabapentin (NEURONTIN) 300 MG capsule Take 1 capsule (300 mg total) by mouth at bedtime. 30 capsule 6   insulin glargine (LANTUS) 100 UNIT/ML Solostar Pen Inject 35 Units into the skin at bedtime. (Patient taking differently: Inject 30 Units into the skin in the morning.) 15 mL 6   losartan-hydrochlorothiazide (HYZAAR) 100-25 MG tablet TAKE 1 TABLET BY MOUTH DAILY. (Patient taking differently: Take 1 tablet by mouth in the morning.) 30 tablet 6   metFORMIN (GLUCOPHAGE-XR) 500 MG 24 hr tablet TAKE 1 TABLET (500 MG TOTAL) BY MOUTH 2 (TWO) TIMES DAILY. 60 tablet 6   nitroGLYCERIN (NITRODUR - DOSED IN MG/24 HR) 0.2 mg/hr patch Place 1 patch (0.2 mg total) onto the skin daily. 30 patch 12   oxyCODONE (OXY IR/ROXICODONE) 5 MG immediate release tablet Take 1 tablet (5 mg total) by mouth every 4 (four) hours as needed for moderate pain, severe pain or breakthrough pain. 30 tablet 0   pentoxifylline (TRENTAL) 400 MG CR tablet Take 1 tablet (400 mg total) by mouth 3 (three) times daily with meals. 90 tablet 3   docusate sodium (COLACE) 100 MG capsule Take 1 capsule (100 mg total) by mouth daily. 10 capsule 0   Insulin Pen Needle 32G X 4 MM MISC USE TO INJECT INSULIN DAILY. 100 each 2   Insulin Syringe-Needle U-100 (SAFETY INSULIN SYRINGES) 30G X 1/2" 1 ML MISC 35 Units by Does not apply route 2 (two) times daily before a meal. 100 each 1   pantoprazole (PROTONIX) 40 MG tablet Take 1 tablet (40 mg total) by mouth daily. (Patient not taking: Reported on 12/09/2020) 30 tablet 1   [DISCONTINUED] zinc sulfate 220 (50 Zn) MG capsule Take 1 capsule (220 mg total) by mouth daily. (Patient not taking: Reported on 12/09/2020) 30 capsule 1    ALLERGIES:   Allergies  Allergen Reactions   Amlodipine Swelling    BLE edema     FAM HX: Family History  Problem Relation Age of Onset   Diabetes Mother    Diabetes Father    Diabetes Brother    Colon cancer Neg Hx    Esophageal cancer Neg Hx    Pancreatic cancer Neg Hx    Stomach cancer Neg Hx  Liver disease Neg Hx     Social History:   reports that he has been smoking cigarettes. He has a 32.00 pack-year smoking history. He has never used smokeless tobacco. He reports that he does not drink alcohol and does not use drugs.  ROS: ROS: all other systems reviewed and are negative except as per HPI  Blood pressure 138/78, pulse 97, temperature 98 F (36.7 C), temperature source Oral, resp. rate 20, height '5\' 10"'$  (1.778 m), weight 79.4 kg, SpO2 97 %. PHYSICAL EXAM: Physical Exam GEN NAD, sittnig in bed HEENT blind NECK no JVd PULM clear CV RRR ABD soft EXT L BKA in hard protector NEURO AAO x 3    Results for orders placed or performed during the hospital encounter of 12/10/20 (from the past 48 hour(s))  Glucose, capillary     Status: Abnormal   Collection Time: 12/16/20 12:31 PM  Result Value Ref Range   Glucose-Capillary 154 (H) 70 - 99 mg/dL    Comment: Glucose reference range applies only to samples taken after fasting for at least 8 hours.  Glucose, capillary     Status: Abnormal   Collection Time: 12/16/20  4:39 PM  Result Value Ref Range   Glucose-Capillary 263 (H) 70 - 99 mg/dL    Comment: Glucose reference range applies only to samples taken after fasting for at least 8 hours.  Glucose, capillary     Status: Abnormal   Collection Time: 12/16/20  9:50 PM  Result Value Ref Range   Glucose-Capillary 335 (H) 70 - 99 mg/dL    Comment: Glucose reference range applies only to samples taken after fasting for at least 8 hours.  Comprehensive metabolic panel     Status: Abnormal   Collection Time: 12/17/20  5:39 AM  Result Value Ref Range   Sodium 131 (L) 135 - 145 mmol/L   Potassium 5.0 3.5 - 5.1 mmol/L   Chloride 96 (L) 98 - 111 mmol/L    CO2 26 22 - 32 mmol/L   Glucose, Bld 271 (H) 70 - 99 mg/dL    Comment: Glucose reference range applies only to samples taken after fasting for at least 8 hours.   BUN 102 (H) 6 - 20 mg/dL   Creatinine, Ser 3.08 (H) 0.61 - 1.24 mg/dL   Calcium 8.2 (L) 8.9 - 10.3 mg/dL   Total Protein 6.5 6.5 - 8.1 g/dL   Albumin 1.3 (L) 3.5 - 5.0 g/dL   AST 17 15 - 41 U/L   ALT 11 0 - 44 U/L   Alkaline Phosphatase 74 38 - 126 U/L   Total Bilirubin 0.2 (L) 0.3 - 1.2 mg/dL   GFR, Estimated 24 (L) >60 mL/min    Comment: (NOTE) Calculated using the CKD-EPI Creatinine Equation (2021)    Anion gap 9 5 - 15    Comment: Performed at [facility], [place] [address], [place] [address]  CBC with Differential/Platelet     Status: Abnormal   Collection Time: 12/17/20  5:39 AM  Result Value Ref Range   WBC 11.5 (H) 4.0 - 10.5 K/uL   RBC 2.97 (L) 4.22 - 5.81 MIL/uL   Hemoglobin 7.4 (L) 13.0 - 17.0 g/dL   HCT 23.9 (L) 39.0 - 52.0 %   MCV 80.5 80.0 - 100.0 fL   MCH 24.9 (L) 26.0 - 34.0 pg   MCHC 31.0 30.0 - 36.0 g/dL   RDW 19.8 (H) 11.5 - 15.5 %   Platelets 482 (H) 150 -  400 K/uL   nRBC 0.0 0.0 - 0.2 %   Neutrophils Relative % 78 %   Neutro Abs 8.9 (H) 1.7 - 7.7 K/uL   Lymphocytes Relative 14 %   Lymphs Abs 1.6 0.7 - 4.0 K/uL   Monocytes Relative 6 %   Monocytes Absolute 0.7 0.1 - 1.0 K/uL   Eosinophils Relative 1 %   Eosinophils Absolute 0.1 0.0 - 0.5 K/uL   Basophils Relative 0 %   Basophils Absolute 0.0 0.0 - 0.1 K/uL   Immature Granulocytes 1 %   Abs Immature Granulocytes 0.13 (H) 0.00 - 0.07 K/uL    Comment: Performed at [facility] [address] [place], [place] [address]  Brain natriuretic peptide     Status: Abnormal   Collection Time: 12/17/20  5:39 AM  Result Value Ref Range   B Natriuretic Peptide 1,835.4 (H) 0.0 - 100.0 pg/mL    Comment: Performed at [facility] [address], [place], [place] [address]  Glucose, capillary     Status: Abnormal   Collection  Time: 12/17/20  7:56 AM  Result Value Ref Range   Glucose-Capillary 243 (H) 70 - 99 mg/dL    Comment: Glucose reference range applies only to samples taken after fasting for at least 8 hours.  Glucose, capillary     Status: Abnormal   Collection Time: 12/17/20 12:04 PM  Result Value Ref Range   Glucose-Capillary 260 (H) 70 - 99 mg/dL    Comment: Glucose reference range applies only to samples taken after fasting for at least 8 hours.  Glucose, capillary     Status: Abnormal   Collection Time: 12/17/20  4:31 PM  Result Value Ref Range   Glucose-Capillary 213 (H) 70 - 99 mg/dL    Comment: Glucose reference range applies only to samples taken after fasting for at least 8 hours.  Glucose, capillary     Status: Abnormal   Collection Time: 12/17/20  8:42 PM  Result Value Ref Range   Glucose-Capillary 147 (H) 70 - 99 mg/dL    Comment: Glucose reference range applies only to samples taken after fasting for at least 8 hours.  Comprehensive metabolic panel     Status: Abnormal   Collection Time: 12/18/20 12:57 AM  Result Value Ref Range   Sodium 131 (L) 135 - 145 mmol/L   Potassium 5.6 (H) 3.5 - 5.1 mmol/L   Chloride 100 98 - 111 mmol/L   CO2 27 22 - 32 mmol/L   Glucose, Bld 150 (H) 70 - 99 mg/dL    Comment: Glucose reference range applies only to samples taken after fasting for at least 8 hours.   BUN 108 (H) 6 - 20 mg/dL   Creatinine, Ser 3.15 (H) 0.61 - 1.24 mg/dL   Calcium 8.0 (L) 8.9 - 10.3 mg/dL   Total Protein 6.5 6.5 - 8.1 g/dL   Albumin 1.3 (L) 3.5 - 5.0 g/dL   AST 17 15 - 41 U/L   ALT 11 0 - 44 U/L   Alkaline Phosphatase 81 38 - 126 U/L   Total Bilirubin 0.5 0.3 - 1.2 mg/dL   GFR, Estimated 23 (L) >60 mL/min    Comment: (NOTE) Calculated using the CKD-EPI Creatinine Equation (2021)    Anion gap 4 (L) 5 - 15    Comment: Performed at [facility], [place] [address], [place], [place] [address]  CBC with Differential/Platelet     Status: Abnormal   Collection Time: 12/18/20  12:57 AM  Result Value Ref Range   WBC 10.8 (H) 4.0 - 10.5 K/uL   RBC 2.93 (L) 4.22 - 5.81 MIL/uL   Hemoglobin 7.4 (L) 13.0 - 17.0 g/dL   HCT 24.8 (L) 39.0 - 52.0 %   MCV 84.6 80.0 - 100.0 fL   MCH 25.3 (L) 26.0 - 34.0 pg   MCHC 29.8 (L) 30.0 - 36.0 g/dL   RDW 20.2 (H) 11.5 - 15.5 %   Platelets 364 150 - 400 K/uL   nRBC 0.0 0.0 - 0.2 %   Neutrophils Relative % 78 %   Neutro Abs 8.6 (H) 1.7 - 7.7 K/uL   Lymphocytes Relative 14 %   Lymphs Abs 1.5 0.7 - 4.0 K/uL   Monocytes Relative 6 %   Monocytes Absolute 0.7 0.1 - 1.0 K/uL   Eosinophils Relative 1 %   Eosinophils Absolute 0.1 0.0 - 0.5 K/uL   Basophils Relative 0 %   Basophils Absolute 0.0 0.0 - 0.1 K/uL   Immature Granulocytes 1 %   Abs Immature Granulocytes 0.13 (H) 0.00 - 0.07 K/uL    Comment: Performed at [facility], [address], [place], [place] [address]  Brain natriuretic peptide     Status: Abnormal   Collection Time: 12/18/20 12:57 AM  Result Value Ref Range   B Natriuretic Peptide 1,329.4 (H) 0.0 - 100.0 pg/mL    Comment: Performed at [facility] [address], [place], [place] [address]  Glucose, capillary     Status: Abnormal   Collection Time: 12/18/20  8:35 AM  Result Value Ref Range   Glucose-Capillary 155 (H) 70 - 99 mg/dL    Comment: Glucose reference range applies only to samples taken after fasting for at least 8 hours.    No results found.  Assessment/Plan   AKI on CKD 3a: Unclear etiology of AKI, CKD likely d/t HTN and DM.  Concerning that he has hematuria without stones.  ? GN.   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  DM II - per  primary  4. Dispo: pending  Madelon Lips 12/18/2020, 11:44 AM

## 2020-12-18 NOTE — Progress Notes (Signed)
Patient is status post below-knee amputation on the left.  He is lying in bed and due for discharge today he is complaining that he feels like he has a lot of gas.  Denies any abdominal pain had bowel movement x2 yesterday. Vital signs stable afebrile wound VAC is still functioning quite well 0 cc in the canister Abdomen is soft nontender to deep palpation Plan for discharge today follow-up in office in 1 week should be transition to Praveena pump prior to discharge

## 2020-12-18 NOTE — TOC Progression Note (Signed)
Transition of Care [facility]) - Progression Note    Patient Details  Name: Todd Mccoy MRN: [id number] Date of Birth: 07/26/1970  Transition of Care [facility]) CM/SW [place], RN Phone Number: 12/18/2020, 8:27 AM  Clinical Narrative:     Referred to Financial Counseling for Medicaid and other resources.    Expected Discharge Plan: Home/Self Care Barriers to Discharge: Continued Medical Work up  Expected Discharge Plan and Services Expected Discharge Plan: Home/Self Care   Discharge Planning Services: CM Consult Post Acute Care Choice: Durable Medical Equipment (trying to get a wheelchair) Living arrangements for the past 2 months: Single Family Home Expected Discharge Date: 12/18/20                         [facility] Arranged: PT           Social Determinants of Health (SDOH) Interventions    Readmission Risk Interventions No flowsheet data found.

## 2020-12-18 NOTE — Progress Notes (Signed)
Physical Therapy Treatment Patient Details Name: Todd Mccoy MRN: [id number] DOB: 1971/06/30 Today's Date: 12/18/2020    History of Present Illness Pt is a 50 y/o male who presents 6/8   dehiscence of a recent left fifth ray amputation and new ulceration medial left foot.  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  PMHx DM with neuropathy, retinal damage L>R, HTN    PT Comments    Pt now with swollen, painful scrotum making scooting or sitting painful. Pt able to stand with difficulty but unable to hop due to this. Will continue to maximize mobility as pt is able to tolerate.    Follow Up Recommendations  Home health PT;Supervision for mobility/OOB     Equipment Recommendations  Wheelchair (measurements PT);Wheelchair cushion (measurements PT) (18" with removalbe arm rests and elevating leg rests)    Recommendations for Other Services       Precautions / Restrictions Precautions Precautions: Fall Precaution Comments: L BKA, wound vac Required Braces or Orthoses: Other Brace Other Brace: limb protector    Mobility  Bed Mobility Overal bed mobility: Needs Assistance Bed Mobility: Supine to Sit;Sit to Supine     Supine to sit: Supervision Sit to supine: Supervision   General bed mobility comments: Supervision for safety and low vision. Pt with incr time and effort due to swollen scrotum    Transfers Overall transfer level: Needs assistance Equipment used: Rolling walker (2 wheeled) Transfers: Sit to/from Stand Sit to Stand: Min assist         General transfer comment: Assist for balance and support as pt transitioned hands from bed to walker  Ambulation/Gait             General Gait Details: Unable due to painful, swollen scrotum   Stairs             Wheelchair Mobility    Modified Rankin (Stroke Patients Only)       Balance Overall balance assessment: Needs assistance Sitting-balance support: No upper extremity supported;Feet  supported Sitting balance-Leahy Scale: Good     Standing balance support: Bilateral upper extremity supported Standing balance-Leahy Scale: Poor Standing balance comment: walker and min assist for static standing                            Cognition Arousal/Alertness: Awake/alert Behavior During Therapy: Flat affect Overall Cognitive Status: Within Functional Limits for tasks assessed                                        Exercises Amputee Exercises Quad Sets: Strengthening;Left;10 reps;Supine Hip ABduction/ADduction: AROM;Left;10 reps;Supine Hip Flexion/Marching: AROM;Left;10 reps;Supine Knee Flexion: AROM;Left;10 reps;Supine Straight Leg Raises: AROM;Left;10 reps;Supine    General Comments        Pertinent Vitals/Pain Pain Assessment: Faces Faces Pain Scale: Hurts even more Pain Location: scrotum Pain Descriptors / Indicators: Guarding;Grimacing Pain Intervention(s): Monitored during session;Repositioned;Limited activity within patient's tolerance    Home Living                      Prior Function            PT Goals (current goals can now be found in the care plan section) Acute Rehab PT Goals Patient Stated Goal: to go to bathroom Progress towards PT goals: Not progressing toward goals - comment (painful, swollen scrotum)  Frequency    Min 3X/week      PT Plan Current plan remains appropriate;Frequency needs to be updated    Co-evaluation              AM-PAC PT "6 Clicks" Mobility   Outcome Measure  Help needed turning from your back to your side while in a flat bed without using bedrails?: None Help needed moving from lying on your back to sitting on the side of a flat bed without using bedrails?: A Little Help needed moving to and from a bed to a chair (including a wheelchair)?: A Little Help needed standing up from a chair using your arms (e.g., wheelchair or bedside chair)?: A Little Help needed to  walk in hospital room?: Total Help needed climbing 3-5 steps with a railing? : Total 6 Click Score: 15    End of Session Equipment Utilized During Treatment: Gait belt Activity Tolerance: Patient tolerated treatment well Patient left: in bed;with call bell/phone within reach;with bed alarm set   PT Visit Diagnosis: Other abnormalities of gait and mobility (R26.89);Muscle weakness (generalized) (M62.81);Difficulty in walking, not elsewhere classified (R26.2)     Time: [id number] PT Time Calculation (min) (ACUTE ONLY): 28 min  Charges:  $Therapeutic Exercise: 8-22 mins $Therapeutic Activity: 8-22 mins                     [place] Pager [phone] Office [place] 12/18/2020, 5:19 PM

## 2020-12-18 NOTE — Discharge Summary (Signed)
Discharge Diagnoses:  Principal Problem:   Subacute osteomyelitis, left ankle and foot ([place]) Active Problems:   DM2 (diabetes mellitus, type 2) ([facility])   Tobacco abuse   HTN (hypertension)   S/P BKA (below knee amputation) unilateral, left ([place])   Dyslipidemia   Stage 3a chronic kidney disease ([place])   Surgeries: Procedure(s): LEFT BELOW KNEE AMPUTATION on 12/10/2020    Consultants: Treatment Team:  Thurnell Lose, MD  Discharged Condition: Improved  Hospital Course: Todd Mccoy is an 50 y.o. male who was admitted 12/10/2020 with a chief complaint of left foot osteomyelitis, with a final diagnosis of Abscess, Osteomyelitis Left Foot.  Patient was brought to the operating room on 12/10/2020 and underwent Procedure(s): LEFT BELOW KNEE AMPUTATION.    Patient was given perioperative antibiotics:  Anti-infectives (From admission, onward)    Start     Dose/Rate Route Frequency Ordered Stop   12/10/20 2000  cefTRIAXone (ROCEPHIN) 2 g in sodium chloride 0.9 % 100 mL IVPB       See Hyperspace for full Linked Orders Report.   2 g 200 mL/hr over 30 Minutes Intravenous Every 24 hours 12/10/20 1853 12/11/20 2359   12/10/20 2000  metroNIDAZOLE (FLAGYL) IVPB 500 mg       See Hyperspace for full Linked Orders Report.   500 mg 100 mL/hr over 60 Minutes Intravenous Every 8 hours 12/10/20 1853 12/11/20 2359   12/10/20 1715  ceFAZolin (ANCEF) IVPB 2g/100 mL premix  Status:  Discontinued        2 g 200 mL/hr over 30 Minutes Intravenous Every 8 hours 12/10/20 1711 12/10/20 1853   12/10/20 1245  vancomycin (VANCOREADY) IVPB 1000 mg/200 mL        1,000 mg 200 mL/hr over 60 Minutes Intravenous  Once 12/10/20 1241 12/10/20 1350   12/10/20 1135  vancomycin (VANCOCIN) 1-5 GM/200ML-% IVPB       Note to Pharmacy: Renda Rolls   : cabinet override      12/10/20 1135 12/10/20 1247   12/10/20 1130  ceFAZolin (ANCEF) IVPB 2g/100 mL premix        2 g 200 mL/hr over 30 Minutes Intravenous On call  to O.R. 12/10/20 1122 12/10/20 1322     .  Patient was given sequential compression devices, early ambulation, and aspirin for DVT prophylaxis.  Recent vital signs: Patient Vitals for the past 24 hrs:  BP Temp Temp src Pulse Resp SpO2  12/18/20 0515 138/78 98 F (36.7 C) Oral 97 20 97 %  12/17/20 2040 (!) 160/95 -- -- 100 -- 100 %  12/17/20 1629 (!) 161/97 97.8 F (36.6 C) Oral 96 20 99 %  12/17/20 0938 (!) 158/95 -- -- -- -- --  .  Recent laboratory studies: DG Abd 1 View  Result Date: 12/16/2020 CLINICAL DATA:  50 year old male with abdominal distension, constipation. EXAM: ABDOMEN - 1 VIEW COMPARISON:  Abdominal radiographs 12/14/2020 and earlier. Chest radiographs 12/15/2020. FINDINGS: Abdominal and pelvis radiographs at 0947 hours. Non obstructed bowel gas pattern. Moderate volume of retained stool in the large bowel has not significantly changed. Other abdominal and pelvic visceral contours appear normal. No osseous abnormality identified. Lung bases are stable since yesterday. IMPRESSION: Nonobstructed bowel-gas pattern moderate volume of retained stool throughout the colon. Electronically Signed   By: Genevie Ann M.D.   On: 12/16/2020 09:58    Discharge Medications:   Allergies as of 12/18/2020       Reactions   Amlodipine Swelling   BLE edema  Medication List     STOP taking these medications    doxycycline 100 MG tablet Commonly known as: VIBRA-TABS   nitroGLYCERIN 0.2 mg/hr patch Commonly known as: NITRODUR - Dosed in mg/24 hr   pantoprazole 40 MG tablet Commonly known as: PROTONIX   pentoxifylline 400 MG CR tablet Commonly known as: TRENTAL       TAKE these medications    ascorbic acid 1000 MG tablet Commonly known as: VITAMIN C Take 1 tablet (1,000 mg total) by mouth daily.   docusate sodium 100 MG capsule Commonly known as: COLACE Take 1 capsule (100 mg total) by mouth daily.   gabapentin 300 MG capsule Commonly known as: NEURONTIN Take 1  capsule (300 mg total) by mouth at bedtime.   metFORMIN 500 MG 24 hr tablet Commonly known as: GLUCOPHAGE-XR TAKE 1 TABLET (500 MG TOTAL) BY MOUTH 2 (TWO) TIMES DAILY.   oxyCODONE 5 MG immediate release tablet Commonly known as: Oxy IR/ROXICODONE Take 1-2 tablets (5-10 mg total) by mouth every 4 (four) hours as needed for moderate pain (pain score 4-6). What changed:  how much to take reasons to take this   Safety Insulin Syringes 30G X 1/2" 1 ML Misc Generic drug: Insulin Syringe-Needle U-100 35 Units by Does not apply route 2 (two) times daily before a meal.   TRUEplus Pen Needles 32G X 4 MM Misc Generic drug: Insulin Pen Needle USE TO INJECT INSULIN DAILY.   zinc sulfate 220 (50 Zn) MG capsule Take 1 capsule (220 mg total) by mouth daily.       ASK your doctor about these medications    atorvastatin 20 MG tablet Commonly known as: LIPITOR Take 1 tablet (20 mg total) by mouth daily.   Lantus SoloStar 100 UNIT/ML Solostar Pen Generic drug: insulin glargine Inject 35 Units into the skin at bedtime.   losartan-hydrochlorothiazide 100-25 MG tablet Commonly known as: HYZAAR TAKE 1 TABLET BY MOUTH DAILY.               Durable Medical Equipment  (From admission, onward)           Start     Ordered   12/15/20 1135  For home use only DME lightweight manual wheelchair with seat cushion  Once       Comments: Patient suffers from Left BKA,which impairs their ability to perform daily activities like ambulating in the home.  A walker will not resolve  issue with performing activities of daily living. A wheelchair will allow patient to safely perform daily activities. Patient is not able to propel themselves in the home using a standard weight wheelchair due to general weakness. Patient can self propel in the lightweight wheelchair. Length of need Lifetime. Accessories: elevating leg rests (ELRs), wheel locks, extensions and anti-tippers.   12/15/20 1137             Diagnostic Studies: DG Abd 1 View  Result Date: 12/16/2020 CLINICAL DATA:  50 year old male with abdominal distension, constipation. EXAM: ABDOMEN - 1 VIEW COMPARISON:  Abdominal radiographs 12/14/2020 and earlier. Chest radiographs 12/15/2020. FINDINGS: Abdominal and pelvis radiographs at 0947 hours. Non obstructed bowel gas pattern. Moderate volume of retained stool in the large bowel has not significantly changed. Other abdominal and pelvic visceral contours appear normal. No osseous abnormality identified. Lung bases are stable since yesterday. IMPRESSION: Nonobstructed bowel-gas pattern moderate volume of retained stool throughout the colon. Electronically Signed   By: Genevie Ann M.D.   On: 12/16/2020 09:58   US  RENAL  Result Date: 12/12/2020 CLINICAL DATA:  Acute renal injury. EXAM: RENAL / URINARY TRACT ULTRASOUND COMPLETE COMPARISON:  CT abdomen pelvis 09/07/2014 FINDINGS: Right Kidney: Renal measurements: 12.6 x 5 x 6 cm = volume: 198 mL. Echogenicity within normal limits. No mass or hydronephrosis visualized. Left Kidney: Renal measurements: 12.8 x 5.3 x 5.4 cm = volume: 189 mL. Echogenicity within normal limits. No mass or hydronephrosis visualized. Urinary bladder: Thickened bladder wall. Otherwise appears normal for degree of bladder distention. Other: At least small volume ascites.  Right pleural effusion. IMPRESSION: 1. Thickened urinary bladder wall. Correlate with urinalysis for infection. 2. At least small volume ascites. 3. Right pleural effusion. Electronically Signed   By: Iven Finn M.D.   On: 12/12/2020 05:01   DG Chest Port 1 View  Result Date: 12/15/2020 CLINICAL DATA:  Shortness of breath EXAM: PORTABLE CHEST 1 VIEW COMPARISON:  Renal ultrasound 12/11/2020 FINDINGS: Enlargement of the cardiopericardial silhouette. Fill like opacity in the right lung base with significant obscuration of the right cardiac margin. Mild patchy airspace opacity at the left lung base and left  retrocardiac region as well. No pneumothorax. Mild vascular congestion without overt edema. No acute osseous abnormality. IMPRESSION: 1. Cardiomegaly and pulmonary vascular congestion without overt edema. 2. Right basilar airspace opacity most consistent with a moderate layering right pleural effusion and associated right middle and lower lobe atelectasis versus infiltrate. 3. Nonspecific patchy airspace opacity in the left base also likely reflects atelectasis and/or infiltrate. Electronically Signed   By: Jacqulynn Cadet M.D.   On: 12/15/2020 07:51   DG Chest Port 1 View  Result Date: 12/11/2020 CLINICAL DATA:  Shortness of breath. EXAM: PORTABLE CHEST 1 VIEW COMPARISON:  12/10/2020. FINDINGS: Cardiomegaly with mild pulmonary venous congestion. Low lung volumes with bibasilar atelectasis. Bibasilar infiltrates/edema cannot be excluded. Small right pleural effusion. No pneumothorax. IMPRESSION: Cardiomegaly with mild pulmonary venous congestion. 2. Low lung volumes with bibasilar atelectasis. Bibasilar infiltrates/edema cannot be excluded. Small right pleural effusion. Electronically Signed   By: Marcello Moores  Register   On: 12/11/2020 08:06   DG CHEST PORT 1 VIEW  Result Date: 12/10/2020 CLINICAL DATA:  Shortness of breath.  Preoperative study. EXAM: PORTABLE CHEST 1 VIEW COMPARISON:  Chest x-ray 09/06/2014. FINDINGS: Mediastinum and hilar structures normal. Cardiomegaly. Mild pulmonary venous congestion. Right base infiltrate/edema. Small right pleural effusion. IMPRESSION: 1.  Cardiomegaly.  Mild pulmonary venous congestion. 2.  Right base infiltrate/edema.  Small right pleural effusion. These results will be called to the ordering clinician or representative by the Radiologist Assistant, and communication documented in the PACS or [employer]. Electronically Signed   By: Marcello Moores  Register   On: 12/10/2020 12:01   DG Abd Portable 1V  Result Date: 12/14/2020 CLINICAL DATA:  Abdominal distension. EXAM:  PORTABLE ABDOMEN - 1 VIEW COMPARISON:  None. FINDINGS: Or imaging quality due to supine film and patient body habitus. Moderate colonic stool burden. No high-grade obstructive bowel gas pattern. No visible suspicious abdominal calcifications. No acute or worrisome osseous abnormalities. IMPRESSION: Suboptimal imaging quality due to portable supine film and patient body habitus. Moderate stool burden. No convincing high-grade obstruction or other acute abdominal radiographic abnormality Electronically Signed   By: Lovena Le M.D.   On: 12/14/2020 05:57   XR Knee 1-2 Views Left  Result Date: 12/08/2020 2 view radiographs of the left knee shows progressive collapse of the medial tibial plateau fracture.  XR Foot 2 Views Left  Result Date: 12/05/2020 2 views of his left foot demonstrate  postsurgical changes consistent with fifth ray amputation.  He also has some changes within the fourth metatarsal and concerns for further osteomyelitis   Patient benefited maximally from their hospital stay and there were no complications.     Disposition: Discharge disposition: 01-Home or Self Care      Discharge Instructions     Call MD / Call 911   Complete by: As directed    If you experience chest pain or shortness of breath, CALL 911 and be transported to the hospital emergency room.  If you develope a fever above 101 F, pus (white drainage) or increased drainage or redness at the wound, or calf pain, call your surgeon's office.   Call MD / Call 911   Complete by: As directed    If you experience chest pain or shortness of breath, CALL 911 and be transported to the hospital emergency room.  If you develope a fever above 101 F, pus (white drainage) or increased drainage or redness at the wound, or calf pain, call your surgeon's office.   Constipation Prevention   Complete by: As directed    Drink plenty of fluids.  Prune juice may be helpful.  You may use a stool softener, such as Colace (over the  counter) 100 mg twice a day.  Use MiraLax (over the counter) for constipation as needed.   Constipation Prevention   Complete by: As directed    Drink plenty of fluids.  Prune juice may be helpful.  You may use a stool softener, such as Colace (over the counter) 100 mg twice a day.  Use MiraLax (over the counter) for constipation as needed.   Diet - low sodium heart healthy   Complete by: As directed    Diet - low sodium heart healthy   Complete by: As directed    Discharge instructions   Complete by: As directed    Call office if wound vac alarms or stops working   Increase activity slowly as tolerated   Complete by: As directed    Increase activity slowly as tolerated   Complete by: As directed    Negative Pressure Wound Therapy - Incisional   Complete by: As directed    Show patient how to attach vac   Post-operative opioid taper instructions:   Complete by: As directed    POST-OPERATIVE OPIOID TAPER INSTRUCTIONS: It is important to wean off of your opioid medication as soon as possible. If you do not need pain medication after your surgery it is ok to stop day one. Opioids include: Codeine, Hydrocodone(Norco, Vicodin), Oxycodone(Percocet, oxycontin) and hydromorphone amongst others.  Long term and even short term use of opiods can cause: Increased pain response Dependence Constipation Depression Respiratory depression And more.  Withdrawal symptoms can include Flu like symptoms Nausea, vomiting And more Techniques to manage these symptoms Hydrate well Eat regular healthy meals Stay active Use relaxation techniques(deep breathing, meditating, yoga) Do Not substitute Alcohol to help with tapering If you have been on opioids for less than two weeks and do not have pain than it is ok to stop all together.  Plan to wean off of opioids This plan should start within one week post op of your joint replacement. Maintain the same interval or time between taking each dose and  first decrease the dose.  Cut the total daily intake of opioids by one tablet each day Next start to increase the time between doses. The last dose that should be eliminated is the evening dose.  Post-operative opioid taper instructions:   Complete by: As directed    POST-OPERATIVE OPIOID TAPER INSTRUCTIONS: It is important to wean off of your opioid medication as soon as possible. If you do not need pain medication after your surgery it is ok to stop day one. Opioids include: Codeine, Hydrocodone(Norco, Vicodin), Oxycodone(Percocet, oxycontin) and hydromorphone amongst others.  Long term and even short term use of opiods can cause: Increased pain response Dependence Constipation Depression Respiratory depression And more.  Withdrawal symptoms can include Flu like symptoms Nausea, vomiting And more Techniques to manage these symptoms Hydrate well Eat regular healthy meals Stay active Use relaxation techniques(deep breathing, meditating, yoga) Do Not substitute Alcohol to help with tapering If you have been on opioids for less than two weeks and do not have pain than it is ok to stop all together.  Plan to wean off of opioids This plan should start within one week post op of your joint replacement. Maintain the same interval or time between taking each dose and first decrease the dose.  Cut the total daily intake of opioids by one tablet each day Next start to increase the time between doses. The last dose that should be eliminated is the evening dose.          Follow-up Information     Suzan Slick, NP In 1 week.   Specialty: Orthopedic Surgery Contact information: [place] [id number] [place] Follow up on 01/21/2021.   Why: @ 1:30 pm for appointment  with Dr. Denita Lung information: [place] [phone]        Care, [facility] Follow up.   Specialty: [place] Why: A representative from [facility] will contact you to arrange start dte and time for your therapy. Contact information: [place] [address] [place] [address] [phone]                  Signed: Bevely Palmer Kaysha Mccoy 12/18/2020, 7:57 AM

## 2020-12-19 ENCOUNTER — Other Ambulatory Visit (HOSPITAL_COMMUNITY): Payer: Self-pay

## 2020-12-19 LAB — BASIC METABOLIC PANEL
Anion gap: 6 (ref 5–15)
Anion gap: 7 (ref 5–15)
BUN: 115 mg/dL — ABNORMAL HIGH (ref 6–20)
BUN: 120 mg/dL — ABNORMAL HIGH (ref 6–20)
CO2: 26 mmol/L (ref 22–32)
CO2: 25 mmol/L (ref 22–32)
Calcium: 8.2 mg/dL — ABNORMAL LOW (ref 8.9–10.3)
Calcium: 8.3 mg/dL — ABNORMAL LOW (ref 8.9–10.3)
Chloride: 100 mmol/L (ref 98–111)
Chloride: 100 mmol/L (ref 98–111)
Creatinine, Ser: 3.41 mg/dL — ABNORMAL HIGH (ref 0.61–1.24)
Creatinine, Ser: 3.37 mg/dL — ABNORMAL HIGH (ref 0.61–1.24)
GFR, Estimated: 21 mL/min — ABNORMAL LOW (ref >60)
GFR, Estimated: 22 mL/min — ABNORMAL LOW (ref >60)
Glucose, Bld: 131 mg/dL — ABNORMAL HIGH (ref 70–99)
Glucose, Bld: 112 mg/dL — ABNORMAL HIGH (ref 70–99)
Potassium: 6 mmol/L — ABNORMAL HIGH (ref 3.5–5.1)
Potassium: 5.7 mmol/L — ABNORMAL HIGH (ref 3.5–5.1)
Sodium: 132 mmol/L — ABNORMAL LOW (ref 135–145)
Sodium: 132 mmol/L — ABNORMAL LOW (ref 135–145)

## 2020-12-19 LAB — CBC WITH DIFFERENTIAL/PLATELET
Abs Immature Granulocytes: 0.05 10*3/uL (ref 0.00–0.07)
Basophils Absolute: 0 10*3/uL (ref 0.0–0.1)
Basophils Relative: 0 %
Eosinophils Absolute: 0.1 10*3/uL (ref 0.0–0.5)
Eosinophils Relative: 1 %
HCT: 24.6 % — ABNORMAL LOW (ref 39.0–52.0)
Hemoglobin: 7.7 g/dL — ABNORMAL LOW (ref 13.0–17.0)
Immature Granulocytes: 1 %
Lymphocytes Relative: 15 %
Lymphs Abs: 1.4 10*3/uL (ref 0.7–4.0)
MCH: 25.8 pg — ABNORMAL LOW (ref 26.0–34.0)
MCHC: 31.3 g/dL (ref 30.0–36.0)
MCV: 82.6 fL (ref 80.0–100.0)
Monocytes Absolute: 0.8 10*3/uL (ref 0.1–1.0)
Monocytes Relative: 9 %
Neutro Abs: 6.7 10*3/uL (ref 1.7–7.7)
Neutrophils Relative %: 74 %
Platelets: 408 10*3/uL — ABNORMAL HIGH (ref 150–400)
RBC: 2.98 MIL/uL — ABNORMAL LOW (ref 4.22–5.81)
RDW: 19.6 % — ABNORMAL HIGH (ref 11.5–15.5)
WBC: 9 10*3/uL (ref 4.0–10.5)
nRBC: 0 % (ref 0.0–0.2)

## 2020-12-19 LAB — ANCA TITERS
Atypical P-ANCA titer: <1:20 {titer}
C-ANCA: <1:20 {titer}
P-ANCA: <1:20 {titer}

## 2020-12-19 LAB — GLUCOSE, CAPILLARY
Glucose-Capillary: 139 mg/dL — ABNORMAL HIGH (ref 70–99)
Glucose-Capillary: 139 mg/dL — ABNORMAL HIGH (ref 70–99)
Glucose-Capillary: 114 mg/dL — ABNORMAL HIGH (ref 70–99)
Glucose-Capillary: 123 mg/dL — ABNORMAL HIGH (ref 70–99)

## 2020-12-19 LAB — CBC
HCT: 20.8 % — ABNORMAL LOW (ref 39.0–52.0)
Hemoglobin: 6.4 g/dL — CL (ref 13.0–17.0)
MCH: 25 pg — ABNORMAL LOW (ref 26.0–34.0)
MCHC: 30.8 g/dL (ref 30.0–36.0)
MCV: 81.3 fL (ref 80.0–100.0)
Platelets: 430 10*3/uL — ABNORMAL HIGH (ref 150–400)
RBC: 2.56 MIL/uL — ABNORMAL LOW (ref 4.22–5.81)
RDW: 20.4 % — ABNORMAL HIGH (ref 11.5–15.5)
WBC: 10.4 10*3/uL (ref 4.0–10.5)
nRBC: 0 % (ref 0.0–0.2)

## 2020-12-19 LAB — C3 COMPLEMENT: C3 Complement: 81 mg/dL — ABNORMAL LOW (ref 82–167)

## 2020-12-19 LAB — ANA W/REFLEX IF POSITIVE: Anti Nuclear Antibody (ANA): NEGATIVE

## 2020-12-19 LAB — HEPATITIS B SURFACE ANTIGEN: Hepatitis B Surface Ag: NONREACTIVE

## 2020-12-19 LAB — GLOMERULAR BASEMENT MEMBRANE ANTIBODIES: GBM Ab: 8 units (ref 0–20)

## 2020-12-19 LAB — HEPATITIS C ANTIBODY: HCV Ab: NONREACTIVE

## 2020-12-19 LAB — HEPATITIS B CORE ANTIBODY, TOTAL: Hep B Core Total Ab: NONREACTIVE

## 2020-12-19 LAB — PREPARE RBC (CROSSMATCH)

## 2020-12-19 LAB — C4 COMPLEMENT: Complement C4, Body Fluid: 25 mg/dL (ref 12–38)

## 2020-12-19 MED ORDER — SODIUM CHLORIDE 0.9 % IV SOLN
300.0000 mg | Freq: Once | INTRAVENOUS | Status: AC
  Administered 2020-12-19: 300 mg via INTRAVENOUS
  Filled 2020-12-19: qty 15

## 2020-12-19 MED ORDER — TAMSULOSIN HCL 0.4 MG PO CAPS
0.4000 mg | ORAL_CAPSULE | Freq: Every day | ORAL | 1 refills | Status: AC
  Filled 2020-12-19: qty 30, 30d supply, fill #0

## 2020-12-19 MED ORDER — ATORVASTATIN CALCIUM 20 MG PO TABS
20.0000 mg | ORAL_TABLET | Freq: Every day | ORAL | 3 refills | Status: DC
  Filled 2020-12-19: qty 30, 30d supply, fill #0

## 2020-12-19 MED ORDER — FERROUS SULFATE 325 (65 FE) MG PO TABS: 325.0000 mg | ORAL_TABLET | Freq: Three times a day (TID) | ORAL | 3 refills | Status: DC

## 2020-12-19 MED ORDER — SODIUM CHLORIDE 0.9% IV SOLUTION: Freq: Once | INTRAVENOUS | Status: DC

## 2020-12-19 MED ORDER — HEPARIN SODIUM (PORCINE) 5000 UNIT/ML IJ SOLN
5000.0000 [IU] | Freq: Three times a day (TID) | INTRAMUSCULAR | Status: DC
  Administered 2020-12-20 – 2020-12-22 (×7): 5000 [IU] via SUBCUTANEOUS
  Filled 2020-12-19 (×7): qty 1

## 2020-12-19 MED ORDER — SODIUM ZIRCONIUM CYCLOSILICATE 10 G PO PACK
10.0000 g | PACK | Freq: Once | ORAL | Status: AC
  Administered 2020-12-19: 10 g via ORAL
  Filled 2020-12-19: qty 1

## 2020-12-19 MED ORDER — DOCUSATE SODIUM 100 MG PO CAPS
100.0000 mg | ORAL_CAPSULE | Freq: Every day | ORAL | 1 refills | Status: AC
  Filled 2020-12-19: qty 30, 30d supply, fill #0

## 2020-12-19 MED ORDER — LOSARTAN POTASSIUM-HCTZ 100-25 MG PO TABS
1.0000 | ORAL_TABLET | Freq: Every day | ORAL | 6 refills | Status: DC
  Filled 2020-12-19: qty 30, 30d supply, fill #0

## 2020-12-19 MED ORDER — SODIUM ZIRCONIUM CYCLOSILICATE 10 G PO PACK
10.0000 g | PACK | Freq: Two times a day (BID) | ORAL | Status: DC
  Administered 2020-12-19 – 2020-12-22 (×6): 10 g via ORAL
  Filled 2020-12-19 (×5): qty 1

## 2020-12-19 MED ORDER — OXYCODONE HCL 5 MG PO TABS
5.0000 mg | ORAL_TABLET | ORAL | 0 refills | Status: DC | PRN
  Filled 2020-12-19: qty 30, 3d supply, fill #0

## 2020-12-19 MED ORDER — GABAPENTIN 300 MG PO CAPS
300.0000 mg | ORAL_CAPSULE | Freq: Every day | ORAL | 5 refills | Status: DC
  Filled 2020-12-19: qty 30, 30d supply, fill #0

## 2020-12-19 MED ORDER — INSULIN GLARGINE 100 UNIT/ML SOLOSTAR PEN
35.0000 [IU] | PEN_INJECTOR | Freq: Every day | SUBCUTANEOUS | 6 refills | Status: DC
  Filled 2020-12-19: qty 9, 25d supply, fill #0

## 2020-12-19 MED ORDER — FERROUS SULFATE 325 (65 FE) MG PO TABS
325.0000 mg | ORAL_TABLET | Freq: Three times a day (TID) | ORAL | 2 refills | Status: DC
  Filled 2020-12-19: qty 90, 30d supply, fill #0

## 2020-12-19 MED ORDER — SODIUM CHLORIDE 0.45 % IV SOLN: INTRAVENOUS | Status: AC

## 2020-12-19 MED ORDER — "SAFETY INSULIN SYRINGES 30G X 1/2"" 1 ML MISC"
35.0000 [IU] | Freq: Two times a day (BID) | 2 refills | Status: DC
  Filled 2020-12-19: qty 100, fill #0

## 2020-12-19 MED ORDER — FERROUS SULFATE 325 (65 FE) MG PO TABS: 325.0000 mg | ORAL_TABLET | Freq: Two times a day (BID) | ORAL | Status: DC

## 2020-12-19 MED ORDER — METFORMIN HCL ER 500 MG PO TB24
ORAL_TABLET | Freq: Two times a day (BID) | ORAL | 2 refills | Status: DC
  Filled 2020-12-19: qty 60, 30d supply, fill #0

## 2020-12-19 MED ORDER — INSULIN PEN NEEDLE 32G X 4 MM MISC
3 refills | Status: AC
  Filled 2020-12-19: qty 100, 30d supply, fill #0
  Filled 2021-05-18: qty 100, 30d supply, fill #1
  Filled 2021-09-25 – 2021-09-30 (×2): qty 100, 30d supply, fill #2

## 2020-12-19 NOTE — Progress Notes (Signed)
PROGRESS NOTE                                                                                                                                                                                                             Patient Demographics:    Todd Mccoy, is a 50 y.o. male, DOB - 11-21-1970, [id number]  Outpatient Primary MD for the patient is [name], MD    LOS - 9  Admit date - 12/10/2020    CC - L.Foot infection     Brief Narrative (HPI from H&P) Todd Mccoy is a 50 y.o. 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   Subjective:   The patient was seen and examined this morning, stable no acute distress, he was notified that his hemoglobin dropped to 6.4, he did consent to blood transfusion earlier this morning.  His creatinine is getting worse 3.41 today.  He understand he needs to remain back in hospital for further evaluation of his kidney function and blood transfusion today.  Reports of no active bleeding denies any chest pain or shortness of breath   Assessment  & Plan :     Recurrent left foot infection.  S/p BKA.  12/10/2020 - Defer management to primary team orthopedics, stump site under bandage. ABX stopped.   Case discussed with orthopedics bedside on 12/13/20.  Remained stable   2.  AKI on CKD stage IIIa.  -Baseline creatinine 1.7, worsening kidney function creatinine >> 3.41 today  Baseline creatinine around 1.7, he appears severely dehydrated, FeNa still 0.3, IVF + PRBCs as needed, slowly improving, no SOB or rales.so far 6.5 Lits +ve.  -Nephrology Dr. Hollie Salk consulted -evaluated, ANA, ANCA, anti-GBM, dsDNA   3.  Dyslipidemia.  On statin continue.  Stable  4.   Tobacco abuse.  Counseled to quit.  5.  Perioperative blood loss related anemia.   -Received 1 unit of packed RBC earlier this admission receiving second unit on 12/12/2020, no signs of ongoing bleeding, on twice daily PPI, will monitor stools. -Hemoglobin remained stable at 7.4  >>> 6.4  -Planning for another unit of PRBC transfusion today  12/19/2020  6. Constipation - on aggressive B. Regimen ( miralax, colace, dulcolax, Movantik, MOM, Go Lytely, lactulose) + enema PRN.  magnesium citrate 1 bottle -Nursing staff reporting BM x3 since yesterday  7. DM type II.  Currently on SSI, increased Lantus dose.  Lab Results  Component Value Date   HGBA1C 8.6 (H) 11/12/2020    CBG (last 3)  Recent Labs    12/18/20 2114 12/19/20 0823 12/19/20 1158  GLUCAP 70 139* 139*         Condition - Fair  Family Communication  : Per Primary team - Ortho  Code Status :  Full  Consults  :  TRH consulting  PUD Prophylaxis :    Procedures  :     L BKA  Renal US - Non acute      Disposition Plan  :    Status is: Inpatient  Remains inpatient appropriate because:IV treatments appropriate due to intensity of illness or inability to take PO  Dispo: The patient is from: Home              Anticipated d/c is to: To be discharged home with home health in 1-2 days 90             patient currently is not medically stable to d/c.   Difficult to place patient No   DVT Prophylaxis  : Heparin added 12/11/2020.  heparin injection 5,000 Units Start: 12/20/20 1400 SCD's Start: 12/10/20 1712   Lab Results  Component Value Date   PLT 430 (H) 12/19/2020    Diet :  Diet Order             Diet renal/carb modified with fluid restriction Diet-HS Snack? Nothing; Fluid restriction: 1200 mL Fluid; Room service appropriate? Yes; Fluid consistency: Thin  Diet effective now           Diet - low sodium heart healthy           Diet - low sodium heart healthy                    Inpatient  Medications  Scheduled Meds:  sodium chloride   Intravenous Once   vitamin C  1,000 mg Oral Daily   atorvastatin  20 mg Oral Daily   carvedilol  12.5 mg Oral BID WC   docusate sodium  200 mg Oral BID   ferrous sulfate  325 mg Oral TID WC   folic acid  1 mg Oral Daily   gabapentin  300 mg Oral QHS   [START ON 12/20/2020] heparin injection (subcutaneous)  5,000 Units Subcutaneous Q8H   insulin aspart  0-5 Units Subcutaneous QHS   insulin aspart  0-9 Units Subcutaneous TID WC   insulin aspart  3 Units Subcutaneous TID WC   insulin glargine  25 Units Subcutaneous Daily   isosorbide mononitrate  30 mg Oral Daily   multivitamin with minerals  1 tablet Oral Daily   nutrition supplement (JUVEN)  1 packet Oral BID BM   pantoprazole  40 mg Oral BID   Ensure Max Protein  11 oz Oral QHS   tamsulosin  0.4 mg Oral Daily   zinc sulfate  220 mg Oral Daily   Continuous Infusions:  sodium chloride 100 mL/hr at 12/19/20 1212   iron sucrose 300 mg (12/19/20 1225)    PRN Meds:.acetaminophen, alum & mag hydroxide-simeth, guaiFENesin-dextromethorphan, hydrALAZINE, naphazoline-glycerin, ondansetron, oxyCODONE, phenol, simethicone  Antibiotics  :    Anti-infectives (From admission, onward)  Start     Dose/Rate Route Frequency Ordered Stop   12/10/20 2000  cefTRIAXone (ROCEPHIN) 2 g in sodium chloride 0.9 % 100 mL IVPB       See Hyperspace for full Linked Orders Report.   2 g 200 mL/hr over 30 Minutes Intravenous Every 24 hours 12/10/20 1853 12/11/20 2359   12/10/20 2000  metroNIDAZOLE (FLAGYL) IVPB 500 mg       See Hyperspace for full Linked Orders Report.   500 mg 100 mL/hr over 60 Minutes Intravenous Every 8 hours 12/10/20 1853 12/11/20 2359   12/10/20 1715  ceFAZolin (ANCEF) IVPB 2g/100 mL premix  Status:  Discontinued        2 g 200 mL/hr over 30 Minutes Intravenous Every 8 hours 12/10/20 1711 12/10/20 1853   12/10/20 1245  vancomycin (VANCOREADY) IVPB 1000 mg/200 mL        1,000 mg 200  mL/hr over 60 Minutes Intravenous  Once 12/10/20 1241 12/10/20 1350   12/10/20 1135  vancomycin (VANCOCIN) 1-5 GM/200ML-% IVPB       Note to Pharmacy: Renda Rolls   : cabinet override      12/10/20 1135 12/10/20 1247   12/10/20 1130  ceFAZolin (ANCEF) IVPB 2g/100 mL premix        2 g 200 mL/hr over 30 Minutes Intravenous On call to O.R. 12/10/20 1122 12/10/20 1322        Time Spent in minutes  30   Deatra James M.D on 12/19/2020 at 12:40 PM  To page go to [email or web address]   [facility] -  Office  [phone]   See all Orders from today for further details    Objective:   Vitals:   12/19/20 0918 12/19/20 0933 12/19/20 0940 12/19/20 1200  BP: (!) 144/86 126/72 126/72 137/84  Pulse: 88 84 84 87  Resp: '17 18 18 18  '$ Temp: 97.9 F (36.6 C) 98.2 F (36.8 C) 98.2 F (36.8 C) 98.5 F (36.9 C)  TempSrc: Oral  Oral Oral  SpO2:   99% 98%  Weight:      Height:        Wt Readings from Last 3 Encounters:  12/10/20 79.4 kg  11/12/20 89.8 kg  10/22/20 89.8 kg     Intake/Output Summary (Last 24 hours) at 12/19/2020 1240 Last data filed at 12/19/2020 1200 Gross per 24 hour  Intake 794.17 ml  Output --  Net 794.17 ml     Physical Exam  Awake Alert, No new F.N deficits, Normal affect [place].AT,PERRAL Supple Neck,No JVD, No cervical lymphadenopathy appriciated.  Symmetrical Chest wall movement, Good air movement bilaterally, CTAB RRR,No Gallops, Rubs or new Murmurs, No Parasternal Heave +ve B.Sounds, Abd Soft, No tenderness, No organomegaly appriciated, No rebound - guarding or rigidity. L. BKA    Data Review:    CBC Recent Labs  Lab 12/14/20 0104 12/15/20 0204 12/16/20 0100 12/17/20 0539 12/18/20 0057 12/19/20 0123  WBC 15.3* 14.2* 12.3* 11.5* 10.8* 10.4  HGB 8.0* 8.1* 7.7* 7.4* 7.4* 6.4*  HCT 25.6* 26.1* 24.5* 23.9* 24.8* 20.8*  PLT 544* 521* 461* 482* 364 430*  MCV 79.3* 80.3 79.5* 80.5 84.6 81.3  MCH 24.8* 24.9* 25.0* 24.9* 25.3* 25.0*  MCHC  31.3 31.0 31.4 31.0 29.8* 30.8  RDW 19.2* 19.1* 19.4* 19.8* 20.2* 20.4*  LYMPHSABS 2.1 1.7 1.7 1.6 1.5  --   MONOABS 0.9 0.8 0.9 0.7 0.7  --   EOSABS 0.2 0.2 0.2 0.1 0.1  --   BASOSABS 0.0 0.0  0.0 0.0 0.0  --     Recent Labs  Lab 12/13/20 0057 12/14/20 0104 12/15/20 0204 12/16/20 0100 12/17/20 0539 12/18/20 0057 12/19/20 0123  NA 131* 128* 129* 129* 131* 131* 132*  K 4.4 4.6 4.9 4.9 5.0 5.6* 6.0*  CL 98 95* 96* 97* 96* 100 100  CO2 '25 26 27 24 26 27 26  '$ GLUCOSE 263* 235* 236* 222* 271* 150* 131*  BUN 74* 82* 96* 103* 102* 108* 115*  CREATININE 3.24* 3.10* 3.29* 3.09* 3.08* 3.15* 3.41*  CALCIUM 7.4* 7.6* 7.8* 7.8* 8.2* 8.0* 8.2*  AST 13* 13* 14* '16 17 17  '$ --   ALT '7 7 8 10 11 11  '$ --   ALKPHOS 84 79 80 77 74 81  --   BILITOT 0.5 0.5 0.4 0.4 0.2* 0.5  --   ALBUMIN 1.2* 1.3* 1.3* 1.3* 1.3* 1.3*  --   MG 1.9 2.0  --   --   --   --   --   PROCALCITON 0.50  --   --   --   --   --   --   BNP 975.1* 1,374.8* 1,520.6* 1,574.3* 1,835.4* 1,329.4*  --     ------------------------------------------------------------------------------------------------------------------ No results for input(s): CHOL, HDL, LDLCALC, TRIG, CHOLHDL, LDLDIRECT in the last 72 hours.  Lab Results  Component Value Date   HGBA1C 8.6 (H) 11/12/2020   ------------------------------------------------------------------------------------------------------------------ No results for input(s): TSH, T4TOTAL, T3FREE, THYROIDAB in the last 72 hours.  Invalid input(s): FREET3  Cardiac Enzymes No results for input(s): CKMB, TROPONINI, MYOGLOBIN in the last 168 hours.  Invalid input(s): CK ------------------------------------------------------------------------------------------------------------------    Component Value Date/Time   BNP 1,329.4 (H) 12/18/2020 0057    Micro Results Recent Results (from the past 240 hour(s))  SARS Coronavirus 2 by RT PCR (hospital order, performed in [facility] hospital lab)  Nasopharyngeal Nasopharyngeal Swab     Status: None   Collection Time: 12/10/20 11:25 AM   Specimen: Nasopharyngeal Swab  Result Value Ref Range Status   SARS Coronavirus 2 NEGATIVE NEGATIVE Final    Comment: Performed at [facility], [place] [address], [place], [place] [address]  Culture, blood (routine x 2)     Status: None   Collection Time: 12/10/20  7:01 PM   Specimen: BLOOD  Result Value Ref Range Status   Specimen Description BLOOD SITE NOT SPECIFIED  Final   Special Requests   Final    BOTTLES DRAWN AEROBIC AND ANAEROBIC Blood Culture adequate volume   Culture   Final    NO GROWTH 5 DAYS Performed at [facility], [place] [address], [place], [place] [address]    Report Status 12/15/2020 FINAL  Final  Culture, blood (routine x 2)     Status: None   Collection Time: 12/10/20  7:01 PM   Specimen: BLOOD  Result Value Ref Range Status   Specimen Description BLOOD SITE NOT SPECIFIED  Final   Special Requests   Final    BOTTLES DRAWN AEROBIC ONLY Blood Culture results may not be optimal due to an inadequate volume of blood received in culture bottles   Culture   Final    NO GROWTH 5 DAYS Performed at [facility], [place] [address] [place], [place] [address]    Report Status 12/15/2020 FINAL  Final    Radiology Reports DG Abd 1 View  Result Date: 12/16/2020 CLINICAL DATA:  50 year old male with abdominal distension, constipation. EXAM: ABDOMEN - 1 VIEW COMPARISON:  Abdominal radiographs 12/14/2020 and earlier. Chest radiographs 12/15/2020. FINDINGS: Abdominal and pelvis radiographs at 0947 hours. Non obstructed bowel gas pattern. Moderate volume of retained stool in the large bowel has not significantly changed. Other abdominal and pelvic visceral contours appear normal. No osseous abnormality identified. Lung bases are stable since yesterday. IMPRESSION: Nonobstructed bowel-gas pattern moderate volume of retained stool throughout the colon. Electronically Signed   By:  Genevie Ann M.D.   On: 12/16/2020 09:58   US RENAL  Result Date: 12/12/2020 CLINICAL DATA:  Acute renal injury. EXAM: RENAL / URINARY TRACT ULTRASOUND COMPLETE COMPARISON:  CT abdomen pelvis 09/07/2014 FINDINGS: Right Kidney: Renal measurements: 12.6 x 5 x 6 cm = volume: 198 mL. Echogenicity within normal limits. No mass or hydronephrosis visualized. Left Kidney: Renal measurements: 12.8 x 5.3 x 5.4 cm = volume: 189 mL. Echogenicity within normal limits. No mass or hydronephrosis visualized. Urinary bladder: Thickened bladder wall. Otherwise appears normal for degree of bladder distention. Other: At least small volume ascites.  Right pleural effusion. IMPRESSION: 1. Thickened urinary bladder wall. Correlate with urinalysis for infection. 2. At least small volume ascites. 3. Right pleural effusion. Electronically Signed   By: Iven Finn M.D.   On: 12/12/2020 05:01   DG Chest Port 1 View  Result Date: 12/15/2020 CLINICAL DATA:  Shortness of breath EXAM: PORTABLE CHEST 1 VIEW COMPARISON:  Renal ultrasound 12/11/2020 FINDINGS: Enlargement of the cardiopericardial silhouette. Fill like opacity in the right lung base with significant obscuration of the right cardiac margin. Mild patchy airspace opacity at the left lung base and left retrocardiac region as well. No pneumothorax. Mild vascular congestion without overt edema. No acute osseous abnormality. IMPRESSION: 1. Cardiomegaly and pulmonary vascular congestion without overt edema. 2. Right basilar airspace opacity most consistent with a moderate layering right pleural effusion and associated right middle and lower lobe atelectasis versus infiltrate. 3. Nonspecific patchy airspace opacity in the left base also likely reflects atelectasis and/or infiltrate. Electronically Signed   By: Jacqulynn Cadet M.D.   On: 12/15/2020 07:51   DG Chest Port 1 View  Result Date: 12/11/2020 CLINICAL DATA:  Shortness of breath. EXAM: PORTABLE CHEST 1 VIEW COMPARISON:   12/10/2020. FINDINGS: Cardiomegaly with mild pulmonary venous congestion. Low lung volumes with bibasilar atelectasis. Bibasilar infiltrates/edema cannot be excluded. Small right pleural effusion. No pneumothorax. IMPRESSION: Cardiomegaly with mild pulmonary venous congestion. 2. Low lung volumes with bibasilar atelectasis. Bibasilar infiltrates/edema cannot be excluded. Small right pleural effusion. Electronically Signed   By: Marcello Moores  Register   On: 12/11/2020 08:06   DG CHEST PORT 1 VIEW  Result Date: 12/10/2020 CLINICAL DATA:  Shortness of breath.  Preoperative study. EXAM: PORTABLE CHEST 1 VIEW COMPARISON:  Chest x-ray 09/06/2014. FINDINGS: Mediastinum and hilar structures normal. Cardiomegaly. Mild pulmonary venous congestion. Right base infiltrate/edema. Small right pleural effusion. IMPRESSION: 1.  Cardiomegaly.  Mild pulmonary venous congestion. 2.  Right base infiltrate/edema.  Small right pleural effusion. These results will be called to the ordering clinician or representative by the Radiologist Assistant, and communication documented in the PACS or [employer]. Electronically Signed   By: Marcello Moores  Register   On: 12/10/2020 12:01   DG Abd Portable 1V  Result Date: 12/14/2020 CLINICAL DATA:  Abdominal distension. EXAM: PORTABLE ABDOMEN - 1 VIEW COMPARISON:  None. FINDINGS: Or imaging quality due to supine film and patient body habitus. Moderate colonic stool burden. No high-grade obstructive bowel gas pattern. No visible suspicious abdominal calcifications. No acute or worrisome osseous abnormalities. IMPRESSION: Suboptimal imaging  quality due to portable supine film and patient body habitus. Moderate stool burden. No convincing high-grade obstruction or other acute abdominal radiographic abnormality Electronically Signed   By: Lovena Le M.D.   On: 12/14/2020 05:57   XR Knee 1-2 Views Left  Result Date: 12/08/2020 2 view radiographs of the left knee shows progressive collapse of the medial  tibial plateau fracture.  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  He also has some changes within the fourth metatarsal and concerns for further osteomyelitis

## 2020-12-19 NOTE — TOC Transition Note (Addendum)
Transition of Care [facility]) - CM/SW Discharge Note   Patient Details  Name: Todd Mccoy MRN: [id number] Date of Birth: 04-Mar-1971  Transition of Care [facility]) CM/SW Contact:  Verdell Carmine, RN Phone Number: 12/19/2020, 4:22 PM   Clinical Narrative:     Called pateint, no answer, discussed at length with RN, Even with LOG, unable to find wheelchair. Called [employer], [employer] ( closed), [employer], [employer],  [employer] none have wheelchairs except [employer] and they cannot do LOG. Need to find out from patient if there is anyone in the family who has access to borrow wheelchair. Cost of average standard manual wheelchair at [facility] is 201.00. They cannot deliver tonight. , and they do not deliver on weekends. Supervisor of TOC aware of issues with DME 1635: LOG done, Supervisor will pay for wheelchair if family can pick up [place] open until 6pm. Called patient, a family member can pick it up address given, called Hulan Fray with information and credit card,  purchase done. Family will bring wheelchair to bedside, Patient set up with [facility] services for 4 visits for PT, no provenna  [facility] aware of DC in the AM   Barriers to Discharge: ED DME delivery (cannot secure wheelchair from any company in the area)   Patient Goals and CMS Choice Patient states their goals for this hospitalization and ongoing recovery are:: patient wants to return home.   Choice offered to / list presented to : NA ([facility] declined the patient 2/2 prevena)  Discharge Placement                       Discharge Plan and Services   Discharge Planning Services: Medication Assistance Post Acute Care Choice: Durable Medical Equipment (trying to get a wheelchair)                    HH Arranged: PT [place]          Social Determinants of Health (SDOH) Interventions     Readmission Risk Interventions No flowsheet data found.

## 2020-12-19 NOTE — Progress Notes (Signed)
[facility] Progress Note   Assessment/ Plan:    AKI on CKD 3a: Unclear etiology of AKI, CKD likely d/t HTN and DM.  Concerning that he has hematuria without stones on renal US 12/11/20  ? GN.   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  DM II - per primary  4.  Hyperkalemia: - lokelma BID - renal diet ordered  5.  Anemia: - hgb 6.4, getting 1 u pRBC today - no sig bleeding from stump - FOBT ordered   6. Dispo: pending  Subjective:    K 6.0, Hgb 6.4.  Getting 1 u pRBCs.  Lokelma ordered BID too.  FOBT ordered.     Objective:   BP 126/72   Pulse 84   Temp 98.2 F (36.8 C) (Oral)   Resp 18   Ht '5\' 10"'$  (1.778 m)   Wt 79.4 kg   SpO2 99%   BMI 25.11 kg/m   Physical Exam: GEN NAD, sitting in bed HEENT blind NECK no JVD PULM clear CV RRR ABD soft EXT L BKA in sock, no bleeding NEURO AAO x 3  Labs: [employer] Recent Labs  Lab 12/13/20 0057 12/14/20 0104 12/15/20 0204 12/16/20 0100 12/17/20 0539 12/18/20 0057 12/19/20 0123  NA 131* 128* 129* 129* 131* 131* 132*  K 4.4 4.6 4.9 4.9 5.0 5.6* 6.0*  CL 98 95* 96* 97* 96* 100 100  CO2 '25 26 27 24 26 27 26  '$ GLUCOSE 263* 235* 236* 222* 271* 150* 131*  BUN 74* 82* 96* 103* 102* 108* 115*  CREATININE 3.24* 3.10* 3.29* 3.09* 3.08* 3.15* 3.41*  CALCIUM 7.4* 7.6* 7.8* 7.8* 8.2* 8.0* 8.2*   CBC Recent Labs  Lab 12/15/20 0204 12/16/20 0100 12/17/20 0539 12/18/20 0057 12/19/20 0123  WBC 14.2* 12.3* 11.5* 10.8* 10.4  NEUTROABS 11.3* 9.4* 8.9* 8.6*  --   HGB 8.1* 7.7* 7.4* 7.4* 6.4*  HCT 26.1* 24.5* 23.9*  24.8* 20.8*  MCV 80.3 79.5* 80.5 84.6 81.3  PLT 521* 461* 482* 364 430*      Medications:     sodium chloride   Intravenous Once   vitamin C  1,000 mg Oral Daily   atorvastatin  20 mg Oral Daily   carvedilol  12.5 mg Oral BID WC   docusate sodium  200 mg Oral BID   ferrous sulfate  325 mg Oral TID WC   folic acid  1 mg Oral Daily   gabapentin  300 mg Oral QHS   [START ON 12/20/2020] heparin injection (subcutaneous)  5,000 Units Subcutaneous Q8H   insulin aspart  0-5 Units Subcutaneous QHS   insulin aspart  0-9 Units Subcutaneous TID WC   insulin aspart  3 Units Subcutaneous TID WC   insulin glargine  25 Units Subcutaneous Daily   isosorbide mononitrate  30 mg Oral Daily   multivitamin with minerals  1 tablet Oral Daily   nutrition supplement (JUVEN)  1 packet Oral BID BM   pantoprazole  40 mg Oral BID   Ensure Max Protein  11 oz Oral QHS  tamsulosin  0.4 mg Oral Daily   zinc sulfate  220 mg Oral Daily     Madelon Lips MD 12/19/2020, 11:49 AM

## 2020-12-19 NOTE — Progress Notes (Signed)
Patient is 1 week status post below-knee amputation.  He is lying in bed comfortable.  He says he is getting blood transfusion today.  Vital signs stable he did have a critical H&H overnight of 6.4 and 20.  Blood has been ordered.   Wound VAC has had no drainage.  Wound VAC was removed today.  Amputation stump has small amount of bloody drainage but no active bleeding.  Well apposed wound edges surgical staples are in place.  Clean dressing was applied with instructions for dressing changes  Patient was planned for discharge today but because of critical low H&H and need for blood most likely this will be deferred at least until tomorrow

## 2020-12-20 LAB — CBC
HCT: 22.5 % — ABNORMAL LOW (ref 39.0–52.0)
HCT: 23.9 % — ABNORMAL LOW (ref 39.0–52.0)
Hemoglobin: 6.9 g/dL — CL (ref 13.0–17.0)
Hemoglobin: 7.4 g/dL — ABNORMAL LOW (ref 13.0–17.0)
MCH: 25.5 pg — ABNORMAL LOW (ref 26.0–34.0)
MCH: 25.6 pg — ABNORMAL LOW (ref 26.0–34.0)
MCHC: 30.7 g/dL (ref 30.0–36.0)
MCHC: 31 g/dL (ref 30.0–36.0)
MCV: 83 fL (ref 80.0–100.0)
MCV: 82.7 fL (ref 80.0–100.0)
Platelets: 382 10*3/uL (ref 150–400)
Platelets: 384 10*3/uL (ref 150–400)
RBC: 2.71 MIL/uL — ABNORMAL LOW (ref 4.22–5.81)
RBC: 2.89 MIL/uL — ABNORMAL LOW (ref 4.22–5.81)
RDW: 19.9 % — ABNORMAL HIGH (ref 11.5–15.5)
RDW: 19.8 % — ABNORMAL HIGH (ref 11.5–15.5)
WBC: 8.8 10*3/uL (ref 4.0–10.5)
WBC: 9.1 10*3/uL (ref 4.0–10.5)
nRBC: 0 % (ref 0.0–0.2)
nRBC: 0 % (ref 0.0–0.2)

## 2020-12-20 LAB — BASIC METABOLIC PANEL
Anion gap: 7 (ref 5–15)
BUN: 120 mg/dL — ABNORMAL HIGH (ref 6–20)
CO2: 27 mmol/L (ref 22–32)
Calcium: 8.5 mg/dL — ABNORMAL LOW (ref 8.9–10.3)
Chloride: 101 mmol/L (ref 98–111)
Creatinine, Ser: 3.26 mg/dL — ABNORMAL HIGH (ref 0.61–1.24)
GFR, Estimated: 22 mL/min — ABNORMAL LOW (ref >60)
Glucose, Bld: 100 mg/dL — ABNORMAL HIGH (ref 70–99)
Potassium: 5.5 mmol/L — ABNORMAL HIGH (ref 3.5–5.1)
Sodium: 135 mmol/L (ref 135–145)

## 2020-12-20 LAB — URINE CULTURE

## 2020-12-20 LAB — ANTI-DNA ANTIBODY, DOUBLE-STRANDED: ds DNA Ab: 1 IU/mL (ref 0–9)

## 2020-12-20 LAB — GLUCOSE, CAPILLARY
Glucose-Capillary: 90 mg/dL (ref 70–99)
Glucose-Capillary: 123 mg/dL — ABNORMAL HIGH (ref 70–99)
Glucose-Capillary: 192 mg/dL — ABNORMAL HIGH (ref 70–99)
Glucose-Capillary: 184 mg/dL — ABNORMAL HIGH (ref 70–99)

## 2020-12-20 LAB — ANTIEXTRACTABLE NUCLEAR AG
ENA SM Ab Ser-aCnc: <0.2 AI (ref 0.0–0.9)
Ribonucleic Protein: 0.2 AI (ref 0.0–0.9)

## 2020-12-20 LAB — HEMOGLOBIN AND HEMATOCRIT, BLOOD
HCT: 28.9 % — ABNORMAL LOW (ref 39.0–52.0)
Hemoglobin: 9.1 g/dL — ABNORMAL LOW (ref 13.0–17.0)

## 2020-12-20 LAB — HEPATITIS B SURFACE ANTIBODY, QUANTITATIVE: Hep B S AB Quant (Post): 207.6 m[IU]/mL (ref >9.9)

## 2020-12-20 LAB — PREPARE RBC (CROSSMATCH)

## 2020-12-20 MED ORDER — ACETAMINOPHEN 325 MG PO TABS
650.0000 mg | ORAL_TABLET | Freq: Four times a day (QID) | ORAL | Status: DC | PRN
  Administered 2020-12-21: 650 mg via ORAL
  Filled 2020-12-20: qty 2

## 2020-12-20 MED ORDER — FUROSEMIDE 10 MG/ML IJ SOLN: 20.0000 mg | Freq: Once | INTRAMUSCULAR | Status: DC

## 2020-12-20 MED ORDER — SODIUM CHLORIDE 0.9 % IV SOLN: INTRAVENOUS | Status: DC

## 2020-12-20 MED ORDER — SODIUM CHLORIDE 0.9% IV SOLUTION: Freq: Once | INTRAVENOUS | Status: AC

## 2020-12-20 MED ORDER — FERROUS SULFATE 325 (65 FE) MG PO TABS
325.0000 mg | ORAL_TABLET | Freq: Three times a day (TID) | ORAL | Status: DC
  Administered 2020-12-20 – 2020-12-22 (×6): 325 mg via ORAL
  Filled 2020-12-20 (×5): qty 1

## 2020-12-20 MED ORDER — SODIUM CHLORIDE 0.9 % IV SOLN: 300.0000 mg | Freq: Once | INTRAVENOUS | Status: DC

## 2020-12-20 NOTE — Progress Notes (Signed)
Sent message to Dr. Roger Shelter:  The patient's hgb is 6.9 this morning.

## 2020-12-20 NOTE — TOC Transition Note (Signed)
Transition of Care [facility]) - CM/SW Discharge Note   Patient Details  Name: Todd Mccoy MRN: [id number] Date of Birth: July 19, 1970  Transition of Care [facility]) CM/SW Contact:  Carles Collet, RN Phone Number: 12/20/2020, 8:20 AM   Clinical Narrative:    Kaylyn Layer liaison of DC. Family bringing in [facility]. Meds through [facility] filled yesterday. PCP- [place]     Final next level of care: Home w Home Health Services Barriers to Discharge: No Barriers Identified   Patient Goals and CMS Choice Patient states their goals for this hospitalization and ongoing recovery are:: patient wants to return home.   Choice offered to / list presented to : NA ([facility] declined the patient 2/2 prevena)  Discharge Placement                       Discharge Plan and Services   Discharge Planning Services: Medication Assistance Post Acute Care Choice: Durable Medical Equipment (trying to get a wheelchair)                    HH Arranged: PT [place]: [place] Date [place]: 12/20/20 Time HH Agency Contacted: 0820 Representative spoke with at [place]: [place] ([place]) Interventions     Readmission Risk Interventions No flowsheet data found.

## 2020-12-20 NOTE — Progress Notes (Signed)
Patient ID: Todd Mccoy, male   DOB: January 29, 1971, 50 y.o.   MRN: [id number] Patient is status post left transtibial amputation and is still under medical work-up for multiple medical problems.  Plan for discharge when patient is medically stable.

## 2020-12-20 NOTE — Progress Notes (Signed)
[facility] Progress Note   Assessment/ Plan:    AKI on CKD 3a: Unclear etiology of AKI, CKD likely d/t HTN and DM.  Concerning that he has hematuria without stones on renal US 12/11/20  ? GN.   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  DM II - per primary  4.  Hyperkalemia: - lokelma BID - renal diet ordered  5.  Anemia: - hgb 6.4, getting 1 u pRBC 6/17, another today 6/18 - no sig bleeding from stump - FOBT ordered 6/17 and 6/18 - ? RP bleed--> consider CT abd/ pelvis without contrast to define   6. Dispo: pending  Subjective:    K 5.5, Hgb down to 6.9 again, getting another unit of pRBCs. No pain.   Objective:   BP 132/81 (BP Location: Right Arm)   Pulse 85   Temp 98.1 F (36.7 C) (Axillary)   Resp 20   Ht '5\' 10"'$  (1.778 m)   Wt 79.4 kg   SpO2 98%   BMI 25.11 kg/m   Physical Exam: GEN NAD, sitting in bed HEENT blind NECK no JVD PULM clear CV RRR ABD soft EXT L BKA in sock, no bleeding NEURO AAO x 3  Labs: [employer] Recent Labs  Lab 12/15/20 0204 12/16/20 0100 12/17/20 0539 12/18/20 0057 12/19/20 0123 12/19/20 1639 12/20/20 0609  NA 129* 129* 131* 131* 132* 132* 135  K 4.9 4.9 5.0 5.6* 6.0* 5.7* 5.5*  CL 96* 97* 96* 100 100 100 101  CO2 '27 24 26 27 26 25 27  '$ GLUCOSE 236* 222* 271* 150* 131* 112* 100*  BUN 96* 103* 102* 108* 115* 120* 120*  CREATININE 3.29* 3.09* 3.08* 3.15* 3.41* 3.37* 3.26*  CALCIUM 7.8* 7.8* 8.2* 8.0* 8.2* 8.3* 8.5*   CBC Recent  Labs  Lab 12/16/20 0100 12/17/20 0539 12/18/20 0057 12/19/20 0123 12/19/20 1639 12/20/20 0609 12/20/20 0905  WBC 12.3* 11.5* 10.8* 10.4 9.0 8.8 9.1  NEUTROABS 9.4* 8.9* 8.6*  --  6.7  --   --   HGB 7.7* 7.4* 7.4* 6.4* 7.7* 6.9* 7.4*  HCT 24.5* 23.9* 24.8* 20.8* 24.6* 22.5* 23.9*  MCV 79.5* 80.5 84.6 81.3 82.6 83.0 82.7  PLT 461* 482* 364 430* 408* 382 384      Medications:     sodium chloride   Intravenous Once   sodium chloride   Intravenous Once   vitamin C  1,000 mg Oral Daily   atorvastatin  20 mg Oral Daily   carvedilol  12.5 mg Oral BID WC   docusate sodium  200 mg Oral BID   [START ON 12/21/2020] ferrous sulfate  325 mg Oral TID WC   folic acid  1 mg Oral Daily   furosemide  20 mg Intravenous Once   furosemide  20 mg Intravenous Once   gabapentin  300 mg Oral QHS   heparin injection (subcutaneous)  5,000 Units Subcutaneous Q8H   insulin aspart  0-5 Units Subcutaneous QHS  insulin aspart  0-9 Units Subcutaneous TID WC   insulin aspart  3 Units Subcutaneous TID WC   insulin glargine  25 Units Subcutaneous Daily   isosorbide mononitrate  30 mg Oral Daily   multivitamin with minerals  1 tablet Oral Daily   nutrition supplement (JUVEN)  1 packet Oral BID BM   pantoprazole  40 mg Oral BID   Ensure Max Protein  11 oz Oral QHS   sodium zirconium cyclosilicate  10 g Oral BID   tamsulosin  0.4 mg Oral Daily   zinc sulfate  220 mg Oral Daily     Madelon Lips MD 12/20/2020, 10:27 AM

## 2020-12-20 NOTE — Progress Notes (Signed)
Occupational Therapy Treatment Patient Details Name: Todd Mccoy MRN: [id number] DOB: [birth year] Today's Date: 12/20/2020    History of present illness Pt is a 50 y/o male who presents 6/8   dehiscence of a recent left fifth ray amputation and new ulceration medial left foot.  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 Seen for skilled OT treatment session.  Session limited as pt. Declined any eob/oob secondary to swollen scrotum area. Pt. Eager to eat breakfase was agreeable to light bed mobility including repositioning to allow tray in optimal positioning for him.  Provided strategies and positioning ideas of tray and plate for limited vision.  Pt. Receptive but still required some assistance with locating and remembering where items were on his tray.    Follow Up Recommendations  CIR;Supervision/Assistance - 24 hour;Home health OT    Equipment Recommendations       Recommendations for Other Services      Precautions / Restrictions Precautions Precautions: Fall Precaution Comments: L BKA, Other Brace: limb protector Restrictions LLE Weight Bearing: Non weight bearing       Mobility Bed Mobility Overal bed mobility: Needs Assistance             General bed mobility comments: pt. agreeable to bed mobility and repositioning to eat breakfast.  educated on use of rle and b ues to pull up in bed utilizing bed functions also to create a more seated position.  tactile guidence and step by step instructions for pt. to pull self up in bed    Transfers                 General transfer comment: pt. declined eob/oob secondary to swollen scrotum area    Balance                                           ADL either performed or assessed with clinical judgement   ADL Overall ADL's : Needs assistance/impaired Eating/Feeding: Bed level;Set up Eating/Feeding Details (indicate cue type  and reason): reviewed compensatory strategies for location of items on plate and tray.                                   General ADL Comments: pt. declined all eob/oob activity secondary to c/o "swollen scrotum area".  agreeable to vision strategies education and bed mobility only this day     Vision Patient Visual Report: Central vision impairment;Other (comment) (L eye blind, R eye peripheral only) Additional Comments: required tactile cues for items located on left side of tray.  education provided for compensatory strategies. pt. states different light also makes it more difficult or easier to see. states flourecent lights are not good but declined letting me turn the main one off. states natural light was better but would not allow me to make the changes   Perception     Praxis      Cognition Arousal/Alertness: Awake/alert Behavior During Therapy: Flat affect                                   General Comments: some inconsistency with ability to follow one step commands, also asking  a lot of questions mid task that appeared to be understood.  after explaining we needed to get him pulled up in bed i said and provided tactile guidance to hold onto head board to pull himself up and as he was pulling himself up he said "now why am i doing this"        Exercises     Shoulder Instructions       General Comments      Pertinent Vitals/ Pain          Home Living                                          Prior Functioning/Environment              Frequency  Min 2X/week        Progress Toward Goals  OT Goals(current goals can now be found in the care plan section)  Progress towards OT goals: Progressing toward goals     Plan Discharge plan remains appropriate;Frequency remains appropriate    Co-evaluation                 AM-PAC OT "6 Clicks" Daily Activity     Outcome Measure   Help from another person  eating meals?: None Help from another person taking care of personal grooming?: A Little Help from another person toileting, which includes using toliet, bedpan, or urinal?: A Little Help from another person bathing (including washing, rinsing, drying)?: A Little Help from another person to put on and taking off regular upper body clothing?: None Help from another person to put on and taking off regular lower body clothing?: A Lot 6 Click Score: 19    End of Session    OT Visit Diagnosis: Unsteadiness on feet (R26.81);Other abnormalities of gait and mobility (R26.89);Low vision, both eyes (H54.2) Pain - Right/Left: Left   Activity Tolerance Patient limited by pain   Patient Left in bed;with call bell/phone within reach;with bed alarm set   Nurse Communication Other (comment) (notified cna bottom bed rail down to allow for tray to be over pts. lap. she states she will check in on him also)        Time: [id number] OT Time Calculation (min): 12 min  Charges: OT General Charges $OT Visit: 1 Visit OT Treatments $Self Care/Home Management : 8-22 mins  Sonia Baller, COTA/L Acute Rehabilitation [phone]    Tanya Nones 12/20/2020, 12:18 PM

## 2020-12-20 NOTE — Progress Notes (Signed)
PROGRESS NOTE                                                                                                                                                                                                             Patient Demographics:    Todd Mccoy, is a 50 y.o. male, DOB - May 13, 1971, [id number]  Outpatient Primary MD for the patient is [name], MD    LOS - 10  Admit date - 12/10/2020    CC - L.Foot infection     Brief Narrative (HPI from H&P) Todd Mccoy is a 50 y.o. 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   Subjective:   The patient was seen and examined this morning, he was informed that his hemoglobin is still low 6.9, denies any bleeding from stomach or rectal bleeding. Complaining of scrotal edema.... Agreed for further blood transfusion   Assessment  & Plan :     Recurrent left foot infection.  S/p BKA.  12/10/2020 - Defer management to primary team orthopedics, stump site under bandage. ABX stopped.   Case discussed with orthopedics bedside on 12/13/20.  Remained stable afebrile normotensive   2.  AKI on CKD stage IIIa.  -Baseline creatinine 1.7, worsening kidney function creatinine >> 3.41  >>>3.26 today  Baseline creatinine around 1.7, he appears severely dehydrated, FeNa still 0.3, IVF + PRBCs as needed, slowly improving, no SOB or rales.so far 6.5 Lits +ve.  -Nephrology Dr. Hollie Salk consulted -evaluated, ANA, ANCA, anti-GBM, dsDNA  -Nephrology initiating gentle IV fluid resuscitation to  3.  Dyslipidemia.  On statin continue.  Stable  4.  Tobacco abuse.  Counseled to quit.  5.  Perioperative blood loss related anemia.   -PRBC transfusion since  admission 6/ 8, 6/10, 6/17, (total of 3 units + 2 units today 12/20/2020) -Obtaining Hemoccult, rechecking wound for bleeding  -no signs of ongoing bleeding, on twice daily PPI, will monitor stools. -Hemoglobin remained stable at 7.4  >>> 6.4 >>> 6.9 today  -1 unit of PRBC was transfused 12/19/2020 -Since symptomatic, with acute on chronic CKD, will proceed with 2 units of PRBC transfusion  today 12/20/2020 -Planning for another unit of PRBC transfusion today 12/19/2020  6. Constipation - on aggressive B. Regimen ( miralax, colace, dulcolax, Movantik, MOM, Go Lytely, lactulose) + enema PRN.  magnesium citrate 1 bottle -Nursing staff reporting BM x 2   -Improving  7. DM type II.  Currently on SSI, increased Lantus dose.  Lab Results  Component Value Date   HGBA1C 8.6 (H) 11/12/2020    CBG (last 3)  Recent Labs    12/19/20 1649 12/19/20 2118 12/20/20 0804  GLUCAP 114* 123* 90         Condition - Fair  Family Communication  : Per Primary team - Ortho  Code Status :  Full  Consults  :  TRH consulting  PUD Prophylaxis :    Procedures  :     L BKA  Renal US - Non acute      Disposition Plan  :    Status is: Inpatient  Remains inpatient appropriate because:IV treatments appropriate due to intensity of illness or inability to take PO  Dispo: The patient is from: Home              Anticipated d/c is to: To be discharged home with home health in 1-2 days 90             patient currently is not medically stable to d/c.   Difficult to place patient No   DVT Prophylaxis  : Heparin added 12/11/2020.  heparin injection 5,000 Units Start: 12/20/20 1400 SCD's Start: 12/10/20 1712   Lab Results  Component Value Date   PLT 384 12/20/2020    Diet :  Diet Order             Diet renal/carb modified with fluid restriction Diet-HS Snack? Nothing; Fluid restriction: 1200 mL Fluid; Room service appropriate? Yes; Fluid consistency: Thin  Diet effective now           Diet  - low sodium heart healthy           Diet - low sodium heart healthy                    Inpatient Medications  Scheduled Meds:  sodium chloride   Intravenous Once   sodium chloride   Intravenous Once   vitamin C  1,000 mg Oral Daily   atorvastatin  20 mg Oral Daily   carvedilol  12.5 mg Oral BID WC   docusate sodium  200 mg Oral BID   [START ON 12/21/2020] ferrous sulfate  325 mg Oral TID WC   folic acid  1 mg Oral Daily   gabapentin  300 mg Oral QHS   heparin injection (subcutaneous)  5,000 Units Subcutaneous Q8H   insulin aspart  0-5 Units Subcutaneous QHS   insulin aspart  0-9 Units Subcutaneous TID WC   insulin aspart  3 Units Subcutaneous TID WC   insulin glargine  25 Units Subcutaneous Daily   isosorbide mononitrate  30 mg Oral Daily   multivitamin with minerals  1 tablet Oral Daily   nutrition supplement (JUVEN)  1 packet Oral BID BM   pantoprazole  40 mg Oral BID   Ensure Max Protein  11 oz Oral QHS   sodium zirconium cyclosilicate  10 g Oral BID   tamsulosin  0.4 mg Oral Daily   zinc sulfate  220 mg Oral Daily   Continuous Infusions:  sodium chloride     iron sucrose  PRN Meds:.acetaminophen, alum & mag hydroxide-simeth, guaiFENesin-dextromethorphan, hydrALAZINE, naphazoline-glycerin, ondansetron, oxyCODONE, phenol, simethicone  Antibiotics  :    Anti-infectives (From admission, onward)    Start     Dose/Rate Route Frequency Ordered Stop   12/10/20 2000  cefTRIAXone (ROCEPHIN) 2 g in sodium chloride 0.9 % 100 mL IVPB       See Hyperspace for full Linked Orders Report.   2 g 200 mL/hr over 30 Minutes Intravenous Every 24 hours 12/10/20 1853 12/11/20 2359   12/10/20 2000  metroNIDAZOLE (FLAGYL) IVPB 500 mg       See Hyperspace for full Linked Orders Report.   500 mg 100 mL/hr over 60 Minutes Intravenous Every 8 hours 12/10/20 1853 12/11/20 2359   12/10/20 1715  ceFAZolin (ANCEF) IVPB 2g/100 mL premix  Status:  Discontinued        2 g 200 mL/hr over  30 Minutes Intravenous Every 8 hours 12/10/20 1711 12/10/20 1853   12/10/20 1245  vancomycin (VANCOREADY) IVPB 1000 mg/200 mL        1,000 mg 200 mL/hr over 60 Minutes Intravenous  Once 12/10/20 1241 12/10/20 1350   12/10/20 1135  vancomycin (VANCOCIN) 1-5 GM/200ML-% IVPB       Note to Pharmacy: Renda Rolls   : cabinet override      12/10/20 1135 12/10/20 1247   12/10/20 1130  ceFAZolin (ANCEF) IVPB 2g/100 mL premix        2 g 200 mL/hr over 30 Minutes Intravenous On call to O.R. 12/10/20 1122 12/10/20 1322        Time Spent in minutes  30   Deatra James M.D on 12/20/2020 at 11:10 AM  To page go to [email or web address]   [facility] -  Office  [phone]   See all Orders from today for further details    Objective:   Vitals:   12/19/20 0940 12/19/20 1200 12/19/20 2115 12/20/20 0613  BP: 126/72 137/84 138/86 132/81  Pulse: 84 87 84 85  Resp: '18 18 19 20  '$ Temp: 98.2 F (36.8 C) 98.5 F (36.9 C) 98.3 F (36.8 C) 98.1 F (36.7 C)  TempSrc: Oral Oral Axillary Axillary  SpO2: 99% 98% 97% 98%  Weight:      Height:        Wt Readings from Last 3 Encounters:  12/10/20 79.4 kg  11/12/20 89.8 kg  10/22/20 89.8 kg     Intake/Output Summary (Last 24 hours) at 12/20/2020 1110 Last data filed at 12/20/2020 1012 Gross per 24 hour  Intake 794.17 ml  Output --  Net 794.17 ml       Physical Exam:   General:  Alert, oriented, cooperative, no distress;   HEENT:  Normocephalic, PERRL, otherwise with in Normal limits   Neuro:  CNII-XII intact. , normal motor and sensation, reflexes intact   Lungs:   Clear to auscultation BL, Respirations unlabored, no wheezes / crackles  Cardio:    S1/S2, RRR, No murmure, No Rubs or Gallops   Abdomen:   Soft, non-tender, bowel sounds active all four quadrants,  no guarding or peritoneal signs.  Muscular skeletal:  Left BKA -postop wound/stump clean dressing in place  Limited exam - in bed, able to move all 4 extremities,  Normal strength,  2+ pulses,  symmetric, +2 scrotal edema  Skin:  Dry, warm to touch, negative for any Rashes,  Wounds: Please see nursing documentation-scrotal edema           Data Review:    CBC  Recent Labs  Lab 12/15/20 0204 12/16/20 0100 12/17/20 0539 12/18/20 0057 12/19/20 0123 12/19/20 1639 12/20/20 0609 12/20/20 0905  WBC 14.2* 12.3* 11.5* 10.8* 10.4 9.0 8.8 9.1  HGB 8.1* 7.7* 7.4* 7.4* 6.4* 7.7* 6.9* 7.4*  HCT 26.1* 24.5* 23.9* 24.8* 20.8* 24.6* 22.5* 23.9*  PLT 521* 461* 482* 364 430* 408* 382 384  MCV 80.3 79.5* 80.5 84.6 81.3 82.6 83.0 82.7  MCH 24.9* 25.0* 24.9* 25.3* 25.0* 25.8* 25.5* 25.6*  MCHC 31.0 31.4 31.0 29.8* 30.8 31.3 30.7 31.0  RDW 19.1* 19.4* 19.8* 20.2* 20.4* 19.6* 19.9* 19.8*  LYMPHSABS 1.7 1.7 1.6 1.5  --  1.4  --   --   MONOABS 0.8 0.9 0.7 0.7  --  0.8  --   --   EOSABS 0.2 0.2 0.1 0.1  --  0.1  --   --   BASOSABS 0.0 0.0 0.0 0.0  --  0.0  --   --     Recent Labs  Lab 12/14/20 0104 12/15/20 0204 12/16/20 0100 12/17/20 0539 12/18/20 0057 12/19/20 0123 12/19/20 1639 12/20/20 0609  NA 128* 129* 129* 131* 131* 132* 132* 135  K 4.6 4.9 4.9 5.0 5.6* 6.0* 5.7* 5.5*  CL 95* 96* 97* 96* 100 100 100 101  CO2 '26 27 24 26 27 26 25 27  '$ GLUCOSE 235* 236* 222* 271* 150* 131* 112* 100*  BUN 82* 96* 103* 102* 108* 115* 120* 120*  CREATININE 3.10* 3.29* 3.09* 3.08* 3.15* 3.41* 3.37* 3.26*  CALCIUM 7.6* 7.8* 7.8* 8.2* 8.0* 8.2* 8.3* 8.5*  AST 13* 14* '16 17 17  '$ --   --   --   ALT '7 8 10 11 11  '$ --   --   --   ALKPHOS 79 80 77 74 81  --   --   --   BILITOT 0.5 0.4 0.4 0.2* 0.5  --   --   --   ALBUMIN 1.3* 1.3* 1.3* 1.3* 1.3*  --   --   --   MG 2.0  --   --   --   --   --   --   --   BNP 1,374.8* 1,520.6* 1,574.3* 1,835.4* 1,329.4*  --   --   --     ------------------------------------------------------------------------------------------------------------------ No results for input(s): CHOL, HDL, LDLCALC, TRIG, CHOLHDL, LDLDIRECT in the last 72  hours.  Lab Results  Component Value Date   HGBA1C 8.6 (H) 11/12/2020   ------------------------------------------------------------------------------------------------------------------ No results for input(s): TSH, T4TOTAL, T3FREE, THYROIDAB in the last 72 hours.  Invalid input(s): FREET3  Cardiac Enzymes No results for input(s): CKMB, TROPONINI, MYOGLOBIN in the last 168 hours.  Invalid input(s): CK ------------------------------------------------------------------------------------------------------------------    Component Value Date/Time   BNP 1,329.4 (H) 12/18/2020 0057    Micro Results Recent Results (from the past 240 hour(s))  SARS Coronavirus 2 by RT PCR (hospital order, performed in [facility] hospital lab) Nasopharyngeal Nasopharyngeal Swab     Status: None   Collection Time: 12/10/20 11:25 AM   Specimen: Nasopharyngeal Swab  Result Value Ref Range Status   SARS Coronavirus 2 NEGATIVE NEGATIVE Final    Comment: Performed at [facility], [place] [address] [place], [place] [address]  Culture, blood (routine x 2)     Status: None   Collection Time: 12/10/20  7:01 PM   Specimen: BLOOD  Result Value Ref Range Status   Specimen Description BLOOD SITE NOT SPECIFIED  Final   Special Requests   Final  BOTTLES DRAWN AEROBIC AND ANAEROBIC Blood Culture adequate volume   Culture   Final    NO GROWTH 5 DAYS Performed at [facility], [place] [address], [place], [place] [address]    Report Status 12/15/2020 FINAL  Final  Culture, blood (routine x 2)     Status: None   Collection Time: 12/10/20  7:01 PM   Specimen: BLOOD  Result Value Ref Range Status   Specimen Description BLOOD SITE NOT SPECIFIED  Final   Special Requests   Final    BOTTLES DRAWN AEROBIC ONLY Blood Culture results may not be optimal due to an inadequate volume of blood received in culture bottles   Culture   Final    NO GROWTH 5 DAYS Performed at [facility], [place] [address]  [place], [place] [address]    Report Status 12/15/2020 FINAL  Final    Radiology Reports DG Abd 1 View  Result Date: 12/16/2020 CLINICAL DATA:  50 year old male with abdominal distension, constipation. EXAM: ABDOMEN - 1 VIEW COMPARISON:  Abdominal radiographs 12/14/2020 and earlier. Chest radiographs 12/15/2020. FINDINGS: Abdominal and pelvis radiographs at 0947 hours. Non obstructed bowel gas pattern. Moderate volume of retained stool in the large bowel has not significantly changed. Other abdominal and pelvic visceral contours appear normal. No osseous abnormality identified. Lung bases are stable since yesterday. IMPRESSION: Nonobstructed bowel-gas pattern moderate volume of retained stool throughout the colon. Electronically Signed   By: Genevie Ann M.D.   On: 12/16/2020 09:58   US RENAL  Result Date: 12/12/2020 CLINICAL DATA:  Acute renal injury. EXAM: RENAL / URINARY TRACT ULTRASOUND COMPLETE COMPARISON:  CT abdomen pelvis 09/07/2014 FINDINGS: Right Kidney: Renal measurements: 12.6 x 5 x 6 cm = volume: 198 mL. Echogenicity within normal limits. No mass or hydronephrosis visualized. Left Kidney: Renal measurements: 12.8 x 5.3 x 5.4 cm = volume: 189 mL. Echogenicity within normal limits. No mass or hydronephrosis visualized. Urinary bladder: Thickened bladder wall. Otherwise appears normal for degree of bladder distention. Other: At least small volume ascites.  Right pleural effusion. IMPRESSION: 1. Thickened urinary bladder wall. Correlate with urinalysis for infection. 2. At least small volume ascites. 3. Right pleural effusion. Electronically Signed   By: Iven Finn M.D.   On: 12/12/2020 05:01   DG Chest Port 1 View  Result Date: 12/15/2020 CLINICAL DATA:  Shortness of breath EXAM: PORTABLE CHEST 1 VIEW COMPARISON:  Renal ultrasound 12/11/2020 FINDINGS: Enlargement of the cardiopericardial silhouette. Fill like opacity in the right lung base with significant obscuration of the right cardiac margin.  Mild patchy airspace opacity at the left lung base and left retrocardiac region as well. No pneumothorax. Mild vascular congestion without overt edema. No acute osseous abnormality. IMPRESSION: 1. Cardiomegaly and pulmonary vascular congestion without overt edema. 2. Right basilar airspace opacity most consistent with a moderate layering right pleural effusion and associated right middle and lower lobe atelectasis versus infiltrate. 3. Nonspecific patchy airspace opacity in the left base also likely reflects atelectasis and/or infiltrate. Electronically Signed   By: Jacqulynn Cadet M.D.   On: 12/15/2020 07:51   DG Chest Port 1 View  Result Date: 12/11/2020 CLINICAL DATA:  Shortness of breath. EXAM: PORTABLE CHEST 1 VIEW COMPARISON:  12/10/2020. FINDINGS: Cardiomegaly with mild pulmonary venous congestion. Low lung volumes with bibasilar atelectasis. Bibasilar infiltrates/edema cannot be excluded. Small right pleural effusion. No pneumothorax. IMPRESSION: Cardiomegaly with mild pulmonary venous congestion. 2. Low lung volumes with bibasilar atelectasis. Bibasilar infiltrates/edema cannot be excluded. Small  right pleural effusion. Electronically Signed   By: Marcello Moores  Register   On: 12/11/2020 08:06   DG CHEST PORT 1 VIEW  Result Date: 12/10/2020 CLINICAL DATA:  Shortness of breath.  Preoperative study. EXAM: PORTABLE CHEST 1 VIEW COMPARISON:  Chest x-ray 09/06/2014. FINDINGS: Mediastinum and hilar structures normal. Cardiomegaly. Mild pulmonary venous congestion. Right base infiltrate/edema. Small right pleural effusion. IMPRESSION: 1.  Cardiomegaly.  Mild pulmonary venous congestion. 2.  Right base infiltrate/edema.  Small right pleural effusion. These results will be called to the ordering clinician or representative by the Radiologist Assistant, and communication documented in the PACS or [employer]. Electronically Signed   By: Marcello Moores  Register   On: 12/10/2020 12:01   DG Abd Portable 1V  Result  Date: 12/14/2020 CLINICAL DATA:  Abdominal distension. EXAM: PORTABLE ABDOMEN - 1 VIEW COMPARISON:  None. FINDINGS: Or imaging quality due to supine film and patient body habitus. Moderate colonic stool burden. No high-grade obstructive bowel gas pattern. No visible suspicious abdominal calcifications. No acute or worrisome osseous abnormalities. IMPRESSION: Suboptimal imaging quality due to portable supine film and patient body habitus. Moderate stool burden. No convincing high-grade obstruction or other acute abdominal radiographic abnormality Electronically Signed   By: Lovena Le M.D.   On: 12/14/2020 05:57   XR Knee 1-2 Views Left  Result Date: 12/08/2020 2 view radiographs of the left knee shows progressive collapse of the medial tibial plateau fracture.  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  He also has some changes within the fourth metatarsal and concerns for further osteomyelitis

## 2020-12-21 LAB — BPAM RBC
Blood Product Expiration Date: 202207012359
Blood Product Expiration Date: 202207022359
Blood Product Expiration Date: 202207022359
ISSUE DATE / TIME: 202206170912
ISSUE DATE / TIME: 202206181130
ISSUE DATE / TIME: 202206181348
Unit Type and Rh: 7300
Unit Type and Rh: 7300
Unit Type and Rh: 7300

## 2020-12-21 LAB — TYPE AND SCREEN
ABO/RH(D): B POS
Antibody Screen: NEGATIVE
Unit division: 0
Unit division: 0
Unit division: 0

## 2020-12-21 LAB — DIRECT ANTIGLOBULIN TEST (NOT AT ARMC)
DAT, IgG: NEGATIVE
DAT, complement: NEGATIVE

## 2020-12-21 LAB — CBC WITH DIFFERENTIAL/PLATELET
Abs Immature Granulocytes: 0.04 10*3/uL (ref 0.00–0.07)
Basophils Absolute: 0 10*3/uL (ref 0.0–0.1)
Basophils Relative: 0 %
Eosinophils Absolute: 0.1 10*3/uL (ref 0.0–0.5)
Eosinophils Relative: 1 %
HCT: 27.7 % — ABNORMAL LOW (ref 39.0–52.0)
Hemoglobin: 8.8 g/dL — ABNORMAL LOW (ref 13.0–17.0)
Immature Granulocytes: 0 %
Lymphocytes Relative: 11 %
Lymphs Abs: 1.1 10*3/uL (ref 0.7–4.0)
MCH: 27.2 pg (ref 26.0–34.0)
MCHC: 31.8 g/dL (ref 30.0–36.0)
MCV: 85.5 fL (ref 80.0–100.0)
Monocytes Absolute: 0.6 10*3/uL (ref 0.1–1.0)
Monocytes Relative: 6 %
Neutro Abs: 8.2 10*3/uL — ABNORMAL HIGH (ref 1.7–7.7)
Neutrophils Relative %: 82 %
Platelets: 410 10*3/uL — ABNORMAL HIGH (ref 150–400)
RBC: 3.24 MIL/uL — ABNORMAL LOW (ref 4.22–5.81)
RDW: 18.9 % — ABNORMAL HIGH (ref 11.5–15.5)
WBC: 10.1 10*3/uL (ref 4.0–10.5)
nRBC: 0 % (ref 0.0–0.2)

## 2020-12-21 LAB — DIC (DISSEMINATED INTRAVASCULAR COAGULATION)PANEL
D-Dimer, Quant: 2.96 ug/mL-FEU — ABNORMAL HIGH (ref 0.00–0.50)
Fibrinogen: 686 mg/dL — ABNORMAL HIGH (ref 210–475)
INR: 1.4 — ABNORMAL HIGH (ref 0.8–1.2)
Platelets: 374 10*3/uL (ref 150–400)
Prothrombin Time: 16.9 seconds — ABNORMAL HIGH (ref 11.4–15.2)
Smear Review: NONE SEEN
aPTT: 37 seconds — ABNORMAL HIGH (ref 24–36)

## 2020-12-21 LAB — GLUCOSE, CAPILLARY
Glucose-Capillary: 100 mg/dL — ABNORMAL HIGH (ref 70–99)
Glucose-Capillary: 106 mg/dL — ABNORMAL HIGH (ref 70–99)
Glucose-Capillary: 200 mg/dL — ABNORMAL HIGH (ref 70–99)
Glucose-Capillary: 213 mg/dL — ABNORMAL HIGH (ref 70–99)
Glucose-Capillary: 226 mg/dL — ABNORMAL HIGH (ref 70–99)

## 2020-12-21 LAB — BASIC METABOLIC PANEL
Anion gap: 9 (ref 5–15)
BUN: 120 mg/dL — ABNORMAL HIGH (ref 6–20)
CO2: 24 mmol/L (ref 22–32)
Calcium: 8.5 mg/dL — ABNORMAL LOW (ref 8.9–10.3)
Chloride: 100 mmol/L (ref 98–111)
Creatinine, Ser: 3.32 mg/dL — ABNORMAL HIGH (ref 0.61–1.24)
GFR, Estimated: 22 mL/min — ABNORMAL LOW (ref >60)
Glucose, Bld: 194 mg/dL — ABNORMAL HIGH (ref 70–99)
Potassium: 5.3 mmol/L — ABNORMAL HIGH (ref 3.5–5.1)
Sodium: 133 mmol/L — ABNORMAL LOW (ref 135–145)

## 2020-12-21 LAB — CBC
HCT: 28.3 % — ABNORMAL LOW (ref 39.0–52.0)
Hemoglobin: 8.8 g/dL — ABNORMAL LOW (ref 13.0–17.0)
MCH: 26.3 pg (ref 26.0–34.0)
MCHC: 31.1 g/dL (ref 30.0–36.0)
MCV: 84.5 fL (ref 80.0–100.0)
Platelets: 390 10*3/uL (ref 150–400)
RBC: 3.35 MIL/uL — ABNORMAL LOW (ref 4.22–5.81)
RDW: 18.6 % — ABNORMAL HIGH (ref 11.5–15.5)
WBC: 9.4 10*3/uL (ref 4.0–10.5)
nRBC: 0 % (ref 0.0–0.2)

## 2020-12-21 LAB — LACTATE DEHYDROGENASE: LDH: 167 U/L (ref 98–192)

## 2020-12-21 NOTE — Progress Notes (Signed)
[facility] Progress Note   Assessment/ Plan:    AKI on CKD 3a: Unclear etiology of AKI, CKD likely d/t HTN and DM.  Concerning that he has hematuria without stones on renal US 12/11/20  ? GN.   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  DM II - per primary  4.  Hyperkalemia: - lokelma BID - renal diet ordered  5.  Anemia: - hgb 6.4, getting 1 u pRBC 6/17, another today 6/18 - no sig bleeding from stump - FOBT ordered 6/17 and 6/18 - ? RP bleed--> consider CT abd/ pelvis without contrast to define but Hgb stable this AM so hopefully don't have to do   6. Dispo: pending  Subjective:    K 5.3, got pRBCs yesterday, hgb steady this AM.     Objective:   BP (!) 146/90 (BP Location: Left Arm)   Pulse 86   Temp 98.2 F (36.8 C) (Oral)   Resp 18   Ht '5\' 10"'$  (1.778 m)   Wt 79.4 kg   SpO2 98%   BMI 25.11 kg/m   Physical Exam: GEN NAD, sitting in bed HEENT blind NECK no JVD PULM clear CV RRR ABD soft EXT L BKA in sock, no bleeding, R leg 1+ LE edema NEURO AAO x 3  Labs: BMET Recent Labs  Lab 12/16/20 0100 12/17/20 0539 12/18/20 0057 12/19/20 0123 12/19/20 1639 12/20/20 0609 12/21/20 0034  NA 129* 131* 131* 132* 132* 135 133*  K 4.9 5.0 5.6* 6.0* 5.7* 5.5* 5.3*  CL 97* 96* 100 100 100 101 100  CO2 '24 26 27 26 25 27 24  '$ GLUCOSE 222* 271* 150* 131* 112* 100* 194*  BUN 103* 102* 108* 115* 120* 120* 120*  CREATININE 3.09* 3.08* 3.15* 3.41* 3.37* 3.26* 3.32*  CALCIUM 7.8* 8.2* 8.0* 8.2* 8.3* 8.5* 8.5*    CBC Recent Labs  Lab 12/16/20 0100 12/17/20 0539 12/18/20 0057 12/19/20 0123 12/19/20 1639 12/20/20 0609 12/20/20 0905 12/20/20 1809 12/21/20 0034  WBC 12.3* 11.5* 10.8*   < > 9.0 8.8 9.1  --  9.4  NEUTROABS 9.4* 8.9* 8.6*  --  6.7  --   --   --   --   HGB 7.7* 7.4* 7.4*   < > 7.7* 6.9* 7.4* 9.1* 8.8*  HCT 24.5* 23.9* 24.8*   < > 24.6* 22.5* 23.9* 28.9* 28.3*  MCV 79.5* 80.5 84.6   < > 82.6 83.0 82.7  --  84.5  PLT 461* 482* 364   < > 408* 382 384  --  390   < > = values in this interval not displayed.      Medications:     sodium chloride   Intravenous Once   vitamin C  1,000 mg Oral Daily   atorvastatin  20 mg Oral Daily   carvedilol  12.5 mg Oral BID WC   docusate sodium  200 mg Oral BID   ferrous sulfate  325 mg Oral TID WC   folic acid  1 mg  Oral Daily   gabapentin  300 mg Oral QHS   heparin injection (subcutaneous)  5,000 Units Subcutaneous Q8H   insulin aspart  0-5 Units Subcutaneous QHS   insulin aspart  0-9 Units Subcutaneous TID WC   insulin aspart  3 Units Subcutaneous TID WC   insulin glargine  25 Units Subcutaneous Daily   isosorbide mononitrate  30 mg Oral Daily   multivitamin with minerals  1 tablet Oral Daily   nutrition supplement (JUVEN)  1 packet Oral BID BM   pantoprazole  40 mg Oral BID   Ensure Max Protein  11 oz Oral QHS   sodium zirconium cyclosilicate  10 g Oral BID   tamsulosin  0.4 mg Oral Daily   zinc sulfate  220 mg Oral Daily     Madelon Lips MD 12/21/2020, 10:01 AM

## 2020-12-21 NOTE — Progress Notes (Addendum)
PROGRESS NOTE                                                                                                                                                                                                             Patient Demographics:    Todd Mccoy, is a 50 y.o. male, DOB - October 12, 1970, [id number]  Outpatient Primary MD for the patient is [name], MD    LOS - 11  Admit date - 12/10/2020    CC - L.Foot infection     Brief Narrative (HPI from H&P) Todd Mccoy is a 50 y.o. 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   Subjective:   The patient was seen and examined this morning, stable no acute distress Patient nursing staff does not report any bleeding site including from his stump.  Patient states he does not have insurance, he would like to go home despite recommendation of SNF   Assessment  & Plan :     Recurrent left foot infection.  -  S/p BKA.  12/10/2020 - Defer management to primary team orthopedics, stump site under bandage. ABX stopped.   Case discussed with orthopedics bedside on 12/13/20.  Remained stable afebrile normotensive -Remained stable   2.  AKI on CKD stage IIIa.  -Baseline creatinine 1.7, worsening kidney function creatinine >> 3.41  >>>3.26 today  Baseline creatinine around 1.7, he appears severely dehydrated, FeNa still 0.3, IVF + PRBCs as needed, slowly improving, no SOB or rales.so far 6.5 Lits +ve.  -Nephrology Dr. Hollie Salk consulted -evaluated, ANA, ANCA, anti-GBM, dsDNA --all negative -Discontinue IV fluids, -Avoiding all nephrotoxins (Possible cystitis, urine cultures will be repeated per nephrology recommendation)  3.  Dyslipidemia.  On  statin continue.  Stable  4.  Tobacco abuse.  Counseled to quit.  5.  Perioperative blood loss related anemia.   -PRBC transfusion since admission 6/8, 6/10, 6/17, (total of 3 units + 2 units 12/20/2020) -No signs of bleeding due to multiple transfusion, concerning for hemolysis, before ordering labs and following close -Curbside at heme oncology Dr. Alen Blew ... Reviewed the chart carefully there is no signs of  autoimmune or hemolytic anemia ... We will complete the work-up with Coombs test and haptoglobin He believes this is due to multiple comorbidities, he should equilibrate within the next 1 to 2 days   -no signs of ongoing bleeding, on twice daily PPI, will monitor stools. -Hemoglobin remained stable at 7.4  >>> 6.4 >>> 6.9 >>> 8.8 today   -Since symptomatic, with acute on chronic CKD, will proceed with 2 units of PRBC transfusion today 12/20/2020 -Planning for another unit of PRBC transfusion today 12/19/2020  6. Constipation - on aggressive B. Regimen ( miralax, colace, dulcolax, Movantik, MOM, Go Lytely, lactulose) + enema PRN.  magnesium citrate 1 bottle -Nursing staff reporting BM x 2   -Improving  7. DM type II.  Currently on SSI, increased Lantus dose.  Lab Results  Component Value Date   HGBA1C 8.6 (H) 11/12/2020    CBG (last 3)  Recent Labs    12/20/20 2054 12/21/20 0752 12/21/20 1152  GLUCAP 184* 100* 106*         Condition - Fair  Family Communication  : Per Primary team - Ortho  Code Status :  Full  Consults  :  TRH consulting  PUD Prophylaxis :    Procedures  :     L BKA  Renal US - Non acute      Disposition Plan  :    Status is: Inpatient  Remains inpatient appropriate because:IV treatments appropriate due to intensity of illness or inability to take PO  Dispo: The patient is from: Home              Anticipated d/c is to: To be discharged home with home health in 1 days   patient currently is not medically stable to d/c.   Difficult  to place patient No   DVT Prophylaxis  : Heparin added 12/11/2020.  heparin injection 5,000 Units Start: 12/20/20 1400 SCD's Start: 12/10/20 1712   Lab Results  Component Value Date   PLT 390 12/21/2020    Diet :  Diet Order             Diet renal/carb modified with fluid restriction Diet-HS Snack? Nothing; Fluid restriction: 1200 mL Fluid; Room service appropriate? Yes; Fluid consistency: Thin  Diet effective now           Diet - low sodium heart healthy           Diet - low sodium heart healthy                    Inpatient Medications  Scheduled Meds:  sodium chloride   Intravenous Once   vitamin C  1,000 mg Oral Daily   atorvastatin  20 mg Oral Daily   carvedilol  12.5 mg Oral BID WC   docusate sodium  200 mg Oral BID   ferrous sulfate  325 mg Oral TID WC   folic acid  1 mg Oral Daily   gabapentin  300 mg Oral QHS   heparin injection (subcutaneous)  5,000 Units Subcutaneous Q8H   insulin aspart  0-5 Units Subcutaneous QHS   insulin aspart  0-9 Units Subcutaneous TID WC   insulin aspart  3 Units Subcutaneous TID WC   insulin glargine  25 Units Subcutaneous Daily   isosorbide mononitrate  30 mg Oral Daily   multivitamin with minerals  1 tablet Oral Daily   nutrition supplement (JUVEN)  1 packet Oral BID BM   pantoprazole  40 mg Oral BID  Ensure Max Protein  11 oz Oral QHS   sodium zirconium cyclosilicate  10 g Oral BID   tamsulosin  0.4 mg Oral Daily   zinc sulfate  220 mg Oral Daily   Continuous Infusions:    PRN Meds:.acetaminophen, alum & mag hydroxide-simeth, guaiFENesin-dextromethorphan, hydrALAZINE, naphazoline-glycerin, ondansetron, oxyCODONE, phenol, simethicone  Antibiotics  :    Anti-infectives (From admission, onward)    Start     Dose/Rate Route Frequency Ordered Stop   12/10/20 2000  cefTRIAXone (ROCEPHIN) 2 g in sodium chloride 0.9 % 100 mL IVPB       See Hyperspace for full Linked Orders Report.   2 g 200 mL/hr over 30 Minutes  Intravenous Every 24 hours 12/10/20 1853 12/11/20 2359   12/10/20 2000  metroNIDAZOLE (FLAGYL) IVPB 500 mg       See Hyperspace for full Linked Orders Report.   500 mg 100 mL/hr over 60 Minutes Intravenous Every 8 hours 12/10/20 1853 12/11/20 2359   12/10/20 1715  ceFAZolin (ANCEF) IVPB 2g/100 mL premix  Status:  Discontinued        2 g 200 mL/hr over 30 Minutes Intravenous Every 8 hours 12/10/20 1711 12/10/20 1853   12/10/20 1245  vancomycin (VANCOREADY) IVPB 1000 mg/200 mL        1,000 mg 200 mL/hr over 60 Minutes Intravenous  Once 12/10/20 1241 12/10/20 1350   12/10/20 1135  vancomycin (VANCOCIN) 1-5 GM/200ML-% IVPB       Note to Pharmacy: Renda Rolls   : cabinet override      12/10/20 1135 12/10/20 1247   12/10/20 1130  ceFAZolin (ANCEF) IVPB 2g/100 mL premix        2 g 200 mL/hr over 30 Minutes Intravenous On call to O.R. 12/10/20 1122 12/10/20 1322        Time Spent in minutes  30   Deatra James M.D on 12/21/2020 at 12:11 PM  To page go to [email or web address]   [facility] -  Office  [phone]   See all Orders from today for further details    Objective:   Vitals:   12/20/20 1605 12/20/20 2057 12/21/20 0600 12/21/20 0755  BP: (!) 166/98 (!) 158/97 (!) 147/85 (!) 146/90  Pulse: 87 90 87 86  Resp: '18 16 16 18  '$ Temp: 98.3 F (36.8 C) 98.6 F (37 C) 98.5 F (36.9 C) 98.2 F (36.8 C)  TempSrc: Oral Oral Oral Oral  SpO2: 98% 100% 98% 98%  Weight:      Height:        Wt Readings from Last 3 Encounters:  12/10/20 79.4 kg  11/12/20 89.8 kg  10/22/20 89.8 kg     Intake/Output Summary (Last 24 hours) at 12/21/2020 1211 Last data filed at 12/20/2020 1934 Gross per 24 hour  Intake 722.92 ml  Output 300 ml  Net 422.92 ml        Physical Exam:   General:  Alert, oriented, cooperative, no distress;   HEENT:  Normocephalic, PERRL, otherwise with in Normal limits   Neuro:  CNII-XII intact. , normal motor and sensation, reflexes intact   Lungs:    Clear to auscultation BL, Respirations unlabored, no wheezes / crackles  Cardio:    S1/S2, RRR, No murmure, No Rubs or Gallops   Abdomen:   Soft, non-tender, bowel sounds active all four quadrants,  no guarding or peritoneal signs.  Muscular skeletal:  Left BKA Limited exam - in bed, able to move all 4 extremities, Normal strength,  2+ pulses,  symmetric, No pitting edema  Skin:  Dry, warm to touch, negative for any Rashes, postsurgical left BKA is stump dressing in place there is erythema edema  Wounds: Please see nursing documentation              Data Review:    CBC Recent Labs  Lab 12/15/20 0204 12/16/20 0100 12/17/20 0539 12/18/20 0057 12/19/20 0123 12/19/20 1639 12/20/20 0609 12/20/20 0905 12/20/20 1809 12/21/20 0034  WBC 14.2* 12.3* 11.5* 10.8* 10.4 9.0 8.8 9.1  --  9.4  HGB 8.1* 7.7* 7.4* 7.4* 6.4* 7.7* 6.9* 7.4* 9.1* 8.8*  HCT 26.1* 24.5* 23.9* 24.8* 20.8* 24.6* 22.5* 23.9* 28.9* 28.3*  PLT 521* 461* 482* 364 430* 408* 382 384  --  390  MCV 80.3 79.5* 80.5 84.6 81.3 82.6 83.0 82.7  --  84.5  MCH 24.9* 25.0* 24.9* 25.3* 25.0* 25.8* 25.5* 25.6*  --  26.3  MCHC 31.0 31.4 31.0 29.8* 30.8 31.3 30.7 31.0  --  31.1  RDW 19.1* 19.4* 19.8* 20.2* 20.4* 19.6* 19.9* 19.8*  --  18.6*  LYMPHSABS 1.7 1.7 1.6 1.5  --  1.4  --   --   --   --   MONOABS 0.8 0.9 0.7 0.7  --  0.8  --   --   --   --   EOSABS 0.2 0.2 0.1 0.1  --  0.1  --   --   --   --   BASOSABS 0.0 0.0 0.0 0.0  --  0.0  --   --   --   --     Recent Labs  Lab 12/15/20 0204 12/16/20 0100 12/17/20 0539 12/18/20 0057 12/19/20 0123 12/19/20 1639 12/20/20 0609 12/21/20 0034  NA 129* 129* 131* 131* 132* 132* 135 133*  K 4.9 4.9 5.0 5.6* 6.0* 5.7* 5.5* 5.3*  CL 96* 97* 96* 100 100 100 101 100  CO2 '27 24 26 27 26 25 27 24  '$ GLUCOSE 236* 222* 271* 150* 131* 112* 100* 194*  BUN 96* 103* 102* 108* 115* 120* 120* 120*  CREATININE 3.29* 3.09* 3.08* 3.15* 3.41* 3.37* 3.26* 3.32*  CALCIUM 7.8* 7.8* 8.2* 8.0* 8.2*  8.3* 8.5* 8.5*  AST 14* '16 17 17  '$ --   --   --   --   ALT '8 10 11 11  '$ --   --   --   --   ALKPHOS 80 77 74 81  --   --   --   --   BILITOT 0.4 0.4 0.2* 0.5  --   --   --   --   ALBUMIN 1.3* 1.3* 1.3* 1.3*  --   --   --   --   BNP 1,520.6* 1,574.3* 1,835.4* 1,329.4*  --   --   --   --     ------------------------------------------------------------------------------------------------------------------ No results for input(s): CHOL, HDL, LDLCALC, TRIG, CHOLHDL, LDLDIRECT in the last 72 hours.  Lab Results  Component Value Date   HGBA1C 8.6 (H) 11/12/2020   ------------------------------------------------------------------------------------------------------------------ No results for input(s): TSH, T4TOTAL, T3FREE, THYROIDAB in the last 72 hours.  Invalid input(s): FREET3  Cardiac Enzymes No results for input(s): CKMB, TROPONINI, MYOGLOBIN in the last 168 hours.  Invalid input(s): CK ------------------------------------------------------------------------------------------------------------------    Component Value Date/Time   BNP 1,329.4 (H) 12/18/2020 0057    Micro Results Recent Results (from the past 240 hour(s))  Culture, Urine     Status: Abnormal   Collection Time: 12/19/20 12:33 PM  Specimen: Urine, Random  Result Value Ref Range Status   Specimen Description URINE, RANDOM  Final   Special Requests   Final    NONE Performed at [facility], [address] [place], [place] [address]    Culture MULTIPLE SPECIES PRESENT, SUGGEST RECOLLECTION (A)  Final   Report Status 12/20/2020 FINAL  Final    Radiology Reports DG Abd 1 View  Result Date: 12/16/2020 CLINICAL DATA:  50 year old male with abdominal distension, constipation. EXAM: ABDOMEN - 1 VIEW COMPARISON:  Abdominal radiographs 12/14/2020 and earlier. Chest radiographs 12/15/2020. FINDINGS: Abdominal and pelvis radiographs at 0947 hours. Non obstructed bowel gas pattern. Moderate volume of retained stool  in the large bowel has not significantly changed. Other abdominal and pelvic visceral contours appear normal. No osseous abnormality identified. Lung bases are stable since yesterday. IMPRESSION: Nonobstructed bowel-gas pattern moderate volume of retained stool throughout the colon. Electronically Signed   By: Genevie Ann M.D.   On: 12/16/2020 09:58   US RENAL  Result Date: 12/12/2020 CLINICAL DATA:  Acute renal injury. EXAM: RENAL / URINARY TRACT ULTRASOUND COMPLETE COMPARISON:  CT abdomen pelvis 09/07/2014 FINDINGS: Right Kidney: Renal measurements: 12.6 x 5 x 6 cm = volume: 198 mL. Echogenicity within normal limits. No mass or hydronephrosis visualized. Left Kidney: Renal measurements: 12.8 x 5.3 x 5.4 cm = volume: 189 mL. Echogenicity within normal limits. No mass or hydronephrosis visualized. Urinary bladder: Thickened bladder wall. Otherwise appears normal for degree of bladder distention. Other: At least small volume ascites.  Right pleural effusion. IMPRESSION: 1. Thickened urinary bladder wall. Correlate with urinalysis for infection. 2. At least small volume ascites. 3. Right pleural effusion. Electronically Signed   By: Iven Finn M.D.   On: 12/12/2020 05:01   DG Chest Port 1 View  Result Date: 12/15/2020 CLINICAL DATA:  Shortness of breath EXAM: PORTABLE CHEST 1 VIEW COMPARISON:  Renal ultrasound 12/11/2020 FINDINGS: Enlargement of the cardiopericardial silhouette. Fill like opacity in the right lung base with significant obscuration of the right cardiac margin. Mild patchy airspace opacity at the left lung base and left retrocardiac region as well. No pneumothorax. Mild vascular congestion without overt edema. No acute osseous abnormality. IMPRESSION: 1. Cardiomegaly and pulmonary vascular congestion without overt edema. 2. Right basilar airspace opacity most consistent with a moderate layering right pleural effusion and associated right middle and lower lobe atelectasis versus infiltrate. 3.  Nonspecific patchy airspace opacity in the left base also likely reflects atelectasis and/or infiltrate. Electronically Signed   By: Jacqulynn Cadet M.D.   On: 12/15/2020 07:51   DG Chest Port 1 View  Result Date: 12/11/2020 CLINICAL DATA:  Shortness of breath. EXAM: PORTABLE CHEST 1 VIEW COMPARISON:  12/10/2020. FINDINGS: Cardiomegaly with mild pulmonary venous congestion. Low lung volumes with bibasilar atelectasis. Bibasilar infiltrates/edema cannot be excluded. Small right pleural effusion. No pneumothorax. IMPRESSION: Cardiomegaly with mild pulmonary venous congestion. 2. Low lung volumes with bibasilar atelectasis. Bibasilar infiltrates/edema cannot be excluded. Small right pleural effusion. Electronically Signed   By: Marcello Moores  Register   On: 12/11/2020 08:06   DG CHEST PORT 1 VIEW  Result Date: 12/10/2020 CLINICAL DATA:  Shortness of breath.  Preoperative study. EXAM: PORTABLE CHEST 1 VIEW COMPARISON:  Chest x-ray 09/06/2014. FINDINGS: Mediastinum and hilar structures normal. Cardiomegaly. Mild pulmonary venous congestion. Right base infiltrate/edema. Small right pleural effusion. IMPRESSION: 1.  Cardiomegaly.  Mild pulmonary venous congestion. 2.  Right base infiltrate/edema.  Small right pleural effusion. These results will be called to the  ordering clinician or representative by the Radiologist Assistant, and communication documented in the PACS or [employer]. Electronically Signed   By: Marcello Moores  Register   On: 12/10/2020 12:01   DG Abd Portable 1V  Result Date: 12/14/2020 CLINICAL DATA:  Abdominal distension. EXAM: PORTABLE ABDOMEN - 1 VIEW COMPARISON:  None. FINDINGS: Or imaging quality due to supine film and patient body habitus. Moderate colonic stool burden. No high-grade obstructive bowel gas pattern. No visible suspicious abdominal calcifications. No acute or worrisome osseous abnormalities. IMPRESSION: Suboptimal imaging quality due to portable supine film and patient body habitus.  Moderate stool burden. No convincing high-grade obstruction or other acute abdominal radiographic abnormality Electronically Signed   By: Lovena Le M.D.   On: 12/14/2020 05:57   XR Knee 1-2 Views Left  Result Date: 12/08/2020 2 view radiographs of the left knee shows progressive collapse of the medial tibial plateau fracture.  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  He also has some changes within the fourth metatarsal and concerns for further osteomyelitis

## 2020-12-22 ENCOUNTER — Other Ambulatory Visit (HOSPITAL_COMMUNITY): Payer: Self-pay

## 2020-12-22 LAB — CBC
HCT: 27.1 % — ABNORMAL LOW (ref 39.0–52.0)
Hemoglobin: 8.6 g/dL — ABNORMAL LOW (ref 13.0–17.0)
MCH: 27.3 pg (ref 26.0–34.0)
MCHC: 31.7 g/dL (ref 30.0–36.0)
MCV: 86 fL (ref 80.0–100.0)
Platelets: 373 10*3/uL (ref 150–400)
RBC: 3.15 MIL/uL — ABNORMAL LOW (ref 4.22–5.81)
RDW: 18.9 % — ABNORMAL HIGH (ref 11.5–15.5)
WBC: 9.7 10*3/uL (ref 4.0–10.5)
nRBC: 0 % (ref 0.0–0.2)

## 2020-12-22 LAB — URINE CULTURE: Culture: 30000 — AB

## 2020-12-22 LAB — BASIC METABOLIC PANEL
Anion gap: 9 (ref 5–15)
BUN: 117 mg/dL — ABNORMAL HIGH (ref 6–20)
CO2: 24 mmol/L (ref 22–32)
Calcium: 8.5 mg/dL — ABNORMAL LOW (ref 8.9–10.3)
Chloride: 103 mmol/L (ref 98–111)
Creatinine, Ser: 3.26 mg/dL — ABNORMAL HIGH (ref 0.61–1.24)
GFR, Estimated: 22 mL/min — ABNORMAL LOW (ref >60)
Glucose, Bld: 200 mg/dL — ABNORMAL HIGH (ref 70–99)
Potassium: 4.9 mmol/L (ref 3.5–5.1)
Sodium: 136 mmol/L (ref 135–145)

## 2020-12-22 LAB — GLUCOSE, CAPILLARY
Glucose-Capillary: 167 mg/dL — ABNORMAL HIGH (ref 70–99)
Glucose-Capillary: 131 mg/dL — ABNORMAL HIGH (ref 70–99)

## 2020-12-22 MED ORDER — INSULIN GLARGINE 100 UNIT/ML SOLOSTAR PEN
20.0000 [IU] | PEN_INJECTOR | Freq: Every day | SUBCUTANEOUS | 3 refills | Status: DC
  Filled 2020-12-22: qty 15, 75d supply, fill #0

## 2020-12-22 MED ORDER — FUROSEMIDE 40 MG PO TABS
40.0000 mg | ORAL_TABLET | Freq: Every day | ORAL | 0 refills | Status: DC
  Filled 2020-12-22: qty 10, 10d supply, fill #0

## 2020-12-22 MED ORDER — CARVEDILOL 12.5 MG PO TABS
12.5000 mg | ORAL_TABLET | Freq: Two times a day (BID) | ORAL | 3 refills | Status: DC
  Filled 2020-12-22: qty 60, 30d supply, fill #0

## 2020-12-22 MED ORDER — ISOSORBIDE MONONITRATE ER 30 MG PO TB24
30.0000 mg | ORAL_TABLET | Freq: Every day | ORAL | 3 refills | Status: DC
  Filled 2020-12-22: qty 30, 30d supply, fill #0

## 2020-12-22 NOTE — TOC Transition Note (Signed)
Transition of Care [facility]) - CM/SW Discharge Note   Patient Details  Name: Todd Mccoy MRN: [id number] Date of Birth: 04-17-1971  [facility] of [facility]) CM/SW Contact:  Ninfa Meeker, RN Phone Number: 12/22/2020, 4:03 PM   Clinical Narrative:  Case manager was contacted by  Bedside RN to get a 3in1 and tub seat for patient. Case manager contacted [employer], 3in1 will be delivered along with Wheelchair to patient's home on 12/23/20. Patient has already left the hospital.    Final next level of care: [place] Barriers to Discharge: No Barriers Identified   Patient Goals and CMS Choice Patient states their goals for this hospitalization and ongoing recovery are:: patient wants to return home.   Choice offered to / list presented to : NA ([place] declined the patient 2/2 prevena)  Discharge Placement                       Discharge Plan and Services   Discharge Planning Services: Medication Assistance Post Acute Care Choice: Durable Medical Equipment (trying to get a wheelchair)          DME Arranged: 3-N-1, Shower stool DME Agency: [employer] Date DME Agency Contacted: 12/22/20 Time DME Agency Contacted: 1603 Representative spoke with at DME Agency: Freda Munro HH Arranged: PT [place]: [place] Date [place]: 12/20/20 Time [place]: 0820 Representative spoke with at [place]: [place] ([place]) Interventions     Readmission Risk Interventions No flowsheet data found.

## 2020-12-22 NOTE — Progress Notes (Signed)
Inpatient Rehabilitation Admissions Coordinator   Noted  patient prefers discharge directly home due to cost of care of CIR that was presented to him on 6/10. We will not pursue CIR admit. We will again sign off.  Danne Baxter, RN, MSN Rehab Admissions Coordinator [phone] 12/22/2020 8:28 AM

## 2020-12-22 NOTE — Progress Notes (Signed)
Physical Therapy Treatment Patient Details Name: Todd Mccoy MRN: [id number] DOB: 1971/06/23 Today's Date: 12/22/2020    History of Present Illness Pt is a 50 y/o male who presents 6/8   dehiscence of a recent left fifth ray amputation and new ulceration medial left foot.  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 Mobility very much limited by painful, swollen scrotum as well as visual impairment. Needed as much as Min-ModA for transfers today as well as heavy VC for safety/sequencing and navigation for transfer. Has consistently refused SNF during past therapy sessions, remains intent on return home. Left in transport chair with NTs present and attending preparing for DC.    Follow Up Recommendations  Home health PT;Supervision for mobility/OOB (refusing SNF)     Equipment Recommendations  Wheelchair (measurements PT);Wheelchair cushion (measurements PT)    Recommendations for Other Services       Precautions / Restrictions Precautions Precautions: Fall Precaution Comments: L BKA, Required Braces or Orthoses: Other Brace Restrictions Weight Bearing Restrictions: Yes LLE Weight Bearing: Non weight bearing    Mobility  Bed Mobility               General bed mobility comments: sitting at EOB with NTs present upon entry    Transfers Overall transfer level: Needs assistance Equipment used: Rolling walker (2 wheeled) Transfers: Sit to/from Stand Sit to Stand: Mod assist Stand pivot transfers: Min assist       General transfer comment: ModA to boost to full standing position, slow and effortful due to scrotal pain; MinA and assist turning RW to transfer into transport WC  Ambulation/Gait             General Gait Details: Unable due to painful, swollen scrotum   Stairs             Wheelchair Mobility     Modified Rankin (Stroke Patients Only)       Balance Overall balance assessment: Needs assistance Sitting-balance support: No upper extremity supported;Feet supported Sitting balance-Leahy Scale: Good     Standing balance support: Bilateral upper extremity supported Standing balance-Leahy Scale: Poor Standing balance comment: walker and min assist for static standing                            Cognition Arousal/Alertness: Awake/alert Behavior During Therapy: Flat affect Overall Cognitive Status: Within Functional Limits for tasks assessed                                 General Comments: slow processing and definite impaired awareness of safety/deficits, as well as overall course of care with post-BKA recovery      Exercises      General Comments        Pertinent Vitals/Pain Pain Assessment: Faces Faces Pain Scale: Hurts little more Pain Location: scrotum Pain Descriptors / Indicators: Guarding;Grimacing Pain Intervention(s): Monitored during session;Limited activity within patient's tolerance    Home Living                      Prior Function            PT Goals (current goals can now be found in the care plan section) Acute Rehab PT  Goals Patient Stated Goal: to go to bathroom PT Goal Formulation: With patient Time For Goal Achievement: 12/25/20 Potential to Achieve Goals: Good Additional Goals Additional Goal #1: pt will demonstrate ability to mobilize in a w/c with UE's and or R LE in home-like environment  (if pt is to go directly home) Progress towards PT goals: Not progressing toward goals - comment (painful, swollen scrotum)    Frequency    Min 3X/week      PT Plan Current plan remains appropriate    Co-evaluation              AM-PAC PT "6 Clicks" Mobility   Outcome Measure  Help needed turning from your back to your side while in a flat bed without using bedrails?: None Help needed moving from  lying on your back to sitting on the side of a flat bed without using bedrails?: A Little Help needed moving to and from a bed to a chair (including a wheelchair)?: A Little Help needed standing up from a chair using your arms (e.g., wheelchair or bedside chair)?: A Lot Help needed to walk in hospital room?: Total Help needed climbing 3-5 steps with a railing? : Total 6 Click Score: 14    End of Session Equipment Utilized During Treatment: Gait belt Activity Tolerance: Patient tolerated treatment well Patient left: Other (comment) (in transport chair with nursing staff attending) Nurse Communication: Mobility status PT Visit Diagnosis: Other abnormalities of gait and mobility (R26.89);Muscle weakness (generalized) (M62.81);Difficulty in walking, not elsewhere classified (R26.2)     Time: [id number] PT Time Calculation (min) (ACUTE ONLY): 10 min  Charges:  $Therapeutic Activity: 8-22 mins                    Windell Norfolk, DPT, PN1   Supplemental Physical Therapist [place]    Pager [phone] Acute Rehab Office [phone]

## 2020-12-22 NOTE — Discharge Summary (Signed)
Physician Discharge Summary  Todd Mccoy DOB: [birth year] DOA: 12/10/2020  PCP: [name], MD  Admit date: 12/10/2020 Discharge date: 12/22/2020  Admitted From: Home  Disposition:  Home with [facility]   Recommendations for Outpatient Follow-up:  Follow up with PCP Dr. Margarita Rana in 1 week Please follow up with [facility] in 1-2 weeks Please follow up with [facility] Dr. Sharol Given as directed Dr. Margarita Rana: Please follow up BMP obtained by [facility] on Thu 6/23 and obtain BMP at office follow up next week and also evaluate scrotal edema Dr. Margarita Rana: Please assist patient with GI re-referral or Cologuard for iron deficiency anemia        Home Health: PT/OT due to amputation, only able to transfer bed to chair  Equipment/Devices: Wheelchair  Discharge Condition: Fair  CODE STATUS: FULL Diet recommendation: Diabetic  Brief/Interim Summary: Todd Mccoy is a 50 y.o. 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  Post-op course complicated by renal failure.       PRINCIPAL HOSPITAL DIAGNOSIS: Gangrene of the left foot    Discharge Diagnoses:   Diabetic foot infection, recurrent Went to OR for left transtibial amputation on 6/8 with Dr. Sharol Given.  No intraoperative complications.   Acute renal failure on CKD stage IIIa Baseline Cr 1.2-1.7; here, worsened post-operatively to 3.2, no signficant improvement with fluids.  Nephrology were consulted, urinalysis showed microscopic hematuria, but glomerulonephritis work-up was notable for negative ANA, ANCA, anti-GBM, antidouble-stranded DNA.  Patient has close [name] follow-up recommended   Fluid overload Scrotal edema By time of discharge, patient with large scrotal edema, pitting edema of both legs.  Was discharged on Lasix with close PCP follow up.  Smoking Smoking cessation strongly recommended.  Iron deficiency anemia on anemia of chronic kidney  disease Noted to have microcytosis on smear, low red cell production by Retic count, low iron saturation.  No signs of hemolysis.  Noted colonoscopy last Nov was aborted and recommended in 3 months, but patient not complied yet.  Discharged on iron, recommend Cologuard or better yet colonoscopy re-referral.    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 If drainage may wrap dressing over shrinker. Patient should have 2 shrinkers so can wash soiled shrinker when removed for daily cleansing   Call MD / Call 911   Complete by: As directed    If you experience chest pain or shortness of breath, CALL 911 and be transported to the hospital emergency room.  If you develope a fever above 101 F, pus (white drainage) or increased drainage or redness at the wound, or calf pain, call your surgeon's office.   Call MD / Call 911   Complete by: As directed    If you experience chest pain or shortness of breath, CALL 911 and be transported to the hospital emergency room.  If you develope a fever above 101 F, pus (white drainage) or increased drainage or redness at the wound, or calf pain, call your surgeon's office.   Constipation Prevention   Complete by: As directed    Drink plenty of fluids.  Prune juice may be helpful.  You may use a  stool softener, such as Colace (over the counter) 100 mg twice a day.  Use MiraLax (over the counter) for constipation as needed.   Constipation Prevention   Complete by: As directed    Drink plenty of fluids.  Prune juice may be helpful.  You may use a stool softener, such as Colace (over the counter) 100 mg twice a day.  Use MiraLax (over the counter) for constipation as needed.   Diet - low sodium heart healthy    Complete by: As directed    Diet - low sodium heart healthy   Complete by: As directed    Diet - low sodium heart healthy   Complete by: As directed    Discharge instructions   Complete by: As directed    Call office if wound vac alarms or stops working   Discharge instructions   Complete by: As directed    From Dr. Loleta Books: You were admitted for gangrene of the left foot Unfortunately, this was too far gone, and had to undergo amputation  Thankfully, the amputation went well, and you should follow up with Dr. Jess Barters office [facility]) on [place]. As outlined below  While you were here, you also had issues with kidneys and with anemia (low blood levels)  Kidneys are most important: Follow up with Dr. Justin Mend and Dr. Bishop Dublin office [facility]) as soon as you can, hopefully next week They will check your kidney function STOP lisinopril-HCTZ as both of these are harmful to the kidneys They are replaced with Imdur and carvedilol, which are different blood pressure medicines  ALSO stop metformin and REDUCE gabapentin/Neurontin and REDUCE Lantus   For the anemia: Take iron (ferrous sulfate) 325 mg twice daily or three times daily Take this with a stool softener like docusate, because it will make you constipated  Go see Dr. Margarita Rana about this and ask about colonoscopy or Cologuard.   For your bladder, start the new medicine tamsulosin/Flomax 0.4 mg daily  AM medicines: Carvedilol/Coreg 12.5 mg  Iron ferrous sulfate 325 mg Imdur 30 mg  Tamsulosin/Flomax 0.4  PM (evening) medicines: Atorvastatin 20 mg (cholesterol medicine) Carvedilol/Coreg 12.5 mg  Iron (ferrous sulfate) 325 mg Gabapentin/Neurontin 300 mg  Lantus insulin 20 units   Discharge wound care:   Complete by: As directed    Apply antibacterial soap and pat dry Apply shrinker   Increase activity slowly   Complete by: As directed    Increase activity slowly as tolerated   Complete by: As directed     Increase activity slowly as tolerated   Complete by: As directed    Post-operative opioid taper instructions:   Complete by: As directed    POST-OPERATIVE OPIOID TAPER INSTRUCTIONS: It is important to wean off of your opioid medication as soon as possible. If you do not need pain medication after your surgery it is ok to stop day one. Opioids include: Codeine, Hydrocodone(Norco, Vicodin), Oxycodone(Percocet, oxycontin) and hydromorphone amongst others.  Long term and even short term use of opiods can cause: Increased pain response Dependence Constipation Depression Respiratory depression And more.  Withdrawal symptoms can include Flu like symptoms Nausea, vomiting And more Techniques to manage these symptoms Hydrate well Eat regular healthy meals Stay active Use relaxation techniques(deep breathing, meditating, yoga) Do Not substitute Alcohol to help with tapering If you have been on opioids for less than two weeks and do not have pain than it is ok to stop all together.  Plan to wean off of opioids This plan  should start within one week post op of your joint replacement. Maintain the same interval or time between taking each dose and first decrease the dose.  Cut the total daily intake of opioids by one tablet each day Next start to increase the time between doses. The last dose that should be eliminated is the evening dose.      Post-operative opioid taper instructions:   Complete by: As directed    POST-OPERATIVE OPIOID TAPER INSTRUCTIONS: It is important to wean off of your opioid medication as soon as possible. If you do not need pain medication after your surgery it is ok to stop day one. Opioids include: Codeine, Hydrocodone(Norco, Vicodin), Oxycodone(Percocet, oxycontin) and hydromorphone amongst others.  Long term and even short term use of opiods can cause: Increased pain response Dependence Constipation Depression Respiratory depression And more.  Withdrawal  symptoms can include Flu like symptoms Nausea, vomiting And more Techniques to manage these symptoms Hydrate well Eat regular healthy meals Stay active Use relaxation techniques(deep breathing, meditating, yoga) Do Not substitute Alcohol to help with tapering If you have been on opioids for less than two weeks and do not have pain than it is ok to stop all together.  Plan to wean off of opioids This plan should start within one week post op of your joint replacement. Maintain the same interval or time between taking each dose and first decrease the dose.  Cut the total daily intake of opioids by one tablet each day Next start to increase the time between doses. The last dose that should be eliminated is the evening dose.         Allergies as of 12/22/2020       Reactions   Amlodipine Swelling   BLE edema        Medication List     STOP taking these medications    ascorbic acid 1000 MG tablet Commonly known as: VITAMIN C   doxycycline 100 MG tablet Commonly known as: VIBRA-TABS   losartan-hydrochlorothiazide 100-25 MG tablet Commonly known as: HYZAAR   metFORMIN 500 MG 24 hr tablet Commonly known as: GLUCOPHAGE-XR   nitroGLYCERIN 0.2 mg/hr patch Commonly known as: NITRODUR - Dosed in mg/24 hr   pantoprazole 40 MG tablet Commonly known as: PROTONIX   pentoxifylline 400 MG CR tablet Commonly known as: TRENTAL   zinc sulfate 220 (50 Zn) MG capsule       TAKE these medications    atorvastatin 20 MG tablet Commonly known as: LIPITOR Take 1 tablet (20 mg total) by mouth daily. What changed: when to take this   carvedilol 12.5 MG tablet Commonly known as: COREG Take 1 tablet (12.5 mg total) by mouth 2 (two) times daily with a meal.   docusate sodium 100 MG capsule Commonly known as: COLACE Take 1 capsule (100 mg total) by mouth daily.   FeroSul 325 (65 FE) MG tablet Generic drug: ferrous sulfate Take 1 tablet (325 mg total) by mouth 3 (three)  times daily with meals.   furosemide 40 MG tablet Commonly known as: Lasix Take 1 tablet (40 mg total) by mouth daily for 10 days.   gabapentin 300 MG capsule Commonly known as: NEURONTIN Take 1 capsule (300 mg total) by mouth at bedtime.   insulin glargine 100 UNIT/ML Solostar Pen Commonly known as: LANTUS Inject 20 Units into the skin at bedtime. What changed: how much to take   isosorbide mononitrate 30 MG 24 hr tablet Commonly known as: IMDUR Take 1 tablet (  30 mg total) by mouth daily. Start taking on: December 23, 2020   oxyCODONE 5 MG immediate release tablet Commonly known as: Oxy IR/ROXICODONE Take 1-2 tablets (5-10 mg total) by mouth every 4 (four) hours as needed for moderate pain (pain score 4-6). What changed:  how much to take reasons to take this   Pentips 32G X 4 MM Misc Generic drug: Insulin Pen Needle USE TO INJECT INSULIN DAILY.   Safety Insulin Syringes 30G X 1/2" 1 ML Misc Generic drug: Insulin Syringe-Needle U-100 35 Units by Does not apply route 2 (two) times daily before a meal.   tamsulosin 0.4 MG Caps capsule Commonly known as: FLOMAX Take 1 capsule (0.4 mg total) by mouth daily.               Durable Medical Equipment  (From admission, onward)           Start     Ordered   12/15/20 1135  For home use only DME lightweight manual wheelchair with seat cushion  Once       Comments: Patient suffers from Left BKA,which impairs their ability to perform daily activities like ambulating in the home.  A walker will not resolve  issue with performing activities of daily living. A wheelchair will allow patient to safely perform daily activities. Patient is not able to propel themselves in the home using a standard weight wheelchair due to general weakness. Patient can self propel in the lightweight wheelchair. Length of need Lifetime. Accessories: elevating leg rests (ELRs), wheel locks, extensions and anti-tippers.   12/15/20 1137               Discharge Care Instructions  (From admission, onward)           Start     Ordered   12/22/20 0000  Discharge wound care:       Comments: Apply antibacterial soap and pat dry Apply shrinker   12/22/20 1205            Follow-up Information     Suzan Slick, NP In 1 week.   Specialty: Orthopedic Surgery Contact information: [place] [address] [place] Follow up on 01/21/2021.   Why: @ 1:30 pm for appointment  with Dr. Denita Lung information: [place] [phone]        Care, [facility] Follow up.   Specialty: [place] Why: A representative from [facility] will contact you to arrange start dte and time for your therapy. Contact information: [place] [address] [phone]         Retail, [facility] Follow up.   Why: Medical supply for  wheelchair Contact information: [address] [place] [address] [phone]         [facility] [name]. Schedule an appointment as soon as possible for a visit in 1 week(s).   Contact information: [place] [address] [phone]                Allergies  Allergen Reactions   Amlodipine Swelling    BLE edema    Consultations: Orthopedics Nephrology   Procedures/Studies: DG Abd 1 View  Result Date: 12/16/2020 CLINICAL DATA:  50 year old male with abdominal distension, constipation. EXAM: ABDOMEN - 1 VIEW COMPARISON:  Abdominal radiographs 12/14/2020 and earlier. Chest radiographs 12/15/2020. FINDINGS: Abdominal  and pelvis radiographs at 0947 hours. Non obstructed bowel gas pattern. Moderate volume of retained stool in the large bowel has not significantly changed. Other abdominal and pelvic visceral contours appear normal. No osseous abnormality identified. Lung bases are stable since  yesterday. IMPRESSION: Nonobstructed bowel-gas pattern moderate volume of retained stool throughout the colon. Electronically Signed   By: Genevie Ann M.D.   On: 12/16/2020 09:58   US RENAL  Result Date: 12/12/2020 CLINICAL DATA:  Acute renal injury. EXAM: RENAL / URINARY TRACT ULTRASOUND COMPLETE COMPARISON:  CT abdomen pelvis 09/07/2014 FINDINGS: Right Kidney: Renal measurements: 12.6 x 5 x 6 cm = volume: 198 mL. Echogenicity within normal limits. No mass or hydronephrosis visualized. Left Kidney: Renal measurements: 12.8 x 5.3 x 5.4 cm = volume: 189 mL. Echogenicity within normal limits. No mass or hydronephrosis visualized. Urinary bladder: Thickened bladder wall. Otherwise appears normal for degree of bladder distention. Other: At least small volume ascites.  Right pleural effusion. IMPRESSION: 1. Thickened urinary bladder wall. Correlate with urinalysis for infection. 2. At least small volume ascites. 3. Right pleural effusion. Electronically Signed   By: Iven Finn M.D.   On: 12/12/2020 05:01   DG Chest Port 1 View  Result Date: 12/15/2020 CLINICAL DATA:  Shortness of breath EXAM: PORTABLE CHEST 1 VIEW COMPARISON:  Renal ultrasound 12/11/2020 FINDINGS: Enlargement of the cardiopericardial silhouette. Fill like opacity in the right lung base with significant obscuration of the right cardiac margin. Mild patchy airspace opacity at the left lung base and left retrocardiac region as well. No pneumothorax. Mild vascular congestion without overt edema. No acute osseous abnormality. IMPRESSION: 1. Cardiomegaly and pulmonary vascular congestion without overt edema. 2. Right basilar airspace opacity most consistent with a moderate layering right pleural effusion and associated right middle and lower lobe atelectasis versus infiltrate. 3. Nonspecific patchy airspace opacity in the left base also likely reflects atelectasis and/or infiltrate. Electronically Signed   By: Jacqulynn Cadet M.D.   On: 12/15/2020  07:51   DG Chest Port 1 View  Result Date: 12/11/2020 CLINICAL DATA:  Shortness of breath. EXAM: PORTABLE CHEST 1 VIEW COMPARISON:  12/10/2020. FINDINGS: Cardiomegaly with mild pulmonary venous congestion. Low lung volumes with bibasilar atelectasis. Bibasilar infiltrates/edema cannot be excluded. Small right pleural effusion. No pneumothorax. IMPRESSION: Cardiomegaly with mild pulmonary venous congestion. 2. Low lung volumes with bibasilar atelectasis. Bibasilar infiltrates/edema cannot be excluded. Small right pleural effusion. Electronically Signed   By: Marcello Moores  Register   On: 12/11/2020 08:06   DG CHEST PORT 1 VIEW  Result Date: 12/10/2020 CLINICAL DATA:  Shortness of breath.  Preoperative study. EXAM: PORTABLE CHEST 1 VIEW COMPARISON:  Chest x-ray 09/06/2014. FINDINGS: Mediastinum and hilar structures normal. Cardiomegaly. Mild pulmonary venous congestion. Right base infiltrate/edema. Small right pleural effusion. IMPRESSION: 1.  Cardiomegaly.  Mild pulmonary venous congestion. 2.  Right base infiltrate/edema.  Small right pleural effusion. These results will be called to the ordering clinician or representative by the Radiologist Assistant, and communication documented in the PACS or [employer]. Electronically Signed   By: Marcello Moores  Register   On: 12/10/2020 12:01   DG Abd Portable 1V  Result Date: 12/14/2020 CLINICAL DATA:  Abdominal distension. EXAM: PORTABLE ABDOMEN - 1 VIEW COMPARISON:  None. FINDINGS: Or imaging quality due to supine film and patient body habitus. Moderate colonic stool burden. No high-grade obstructive bowel gas pattern. No visible suspicious abdominal calcifications. No acute or worrisome osseous abnormalities. IMPRESSION: Suboptimal imaging quality due to portable supine film and patient body habitus.  Moderate stool burden. No convincing high-grade obstruction or other acute abdominal radiographic abnormality Electronically Signed   By: Lovena Le M.D.   On: 12/14/2020  05:57   XR Knee 1-2 Views Left  Result Date: 12/08/2020 2 view radiographs of the left knee shows progressive collapse of the medial tibial plateau fracture.  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  He also has some changes within the fourth metatarsal and concerns for further osteomyelitis     Subjective: Feeling well. Scrotal edema noted and leg swelling, but no dyspnea, no fever, no cough.  Discharge Exam: Vitals:   12/22/20 0728 12/22/20 1151  BP: (!) 147/91 (!) 157/95  Pulse: 86 83  Resp: 18 16  Temp: 98.4 F (36.9 C) 98.4 F (36.9 C)  SpO2: 98% 99%   Vitals:   12/22/20 0000 12/22/20 0400 12/22/20 0728 12/22/20 1151  BP: (!) 146/90 (!) 147/80 (!) 147/91 (!) 157/95  Pulse: 90 85 86 83  Resp: '18 17 18 16  '$ Temp: 98 F (36.7 C) 98.1 F (36.7 C) 98.4 F (36.9 C) 98.4 F (36.9 C)  TempSrc: Oral Oral Oral Oral  SpO2: 97% 97% 98% 99%  Weight:      Height:        General: Pt is alert, awake, not in acute distress Cardiovascular: RRR, nl S1-S2, no murmurs appreciated.   2+ LE edema.   Respiratory: Normal respiratory rate and rhythm.  CTAB without rales or wheezes. Abdominal: Abdomen soft and non-tender.  No distension or HSM.   MSK: LE edematous. Scrotal edema noted.  Left BKA new. Neuro/Psych: Strength symmetric in upper and lower extremities.  Judgment and insight appear normal.   The results of significant diagnostics from this hospitalization (including imaging, microbiology, ancillary and laboratory) are listed below for reference.     Microbiology: Recent Results (from the past 240 hour(s))  Culture, Urine     Status: Abnormal   Collection Time: 12/19/20 12:33 PM   Specimen: Urine, Random  Result Value Ref Range Status   Specimen Description URINE, RANDOM  Final   Special Requests   Final    NONE Performed at [facility], [address]  [place], [place] [address]    Culture MULTIPLE SPECIES PRESENT, SUGGEST RECOLLECTION (A)  Final   Report Status 12/20/2020 FINAL  Final  Culture, Urine     Status: Abnormal   Collection Time: 12/21/20  7:27 AM   Specimen: Urine, Random  Result Value Ref Range Status   Specimen Description URINE, RANDOM  Final   Special Requests   Final    NONE Performed at [facility], [place] [address] [place], [place] [address]    Culture (A)  Final    30,000 COLONIES/mL MULTIPLE SPECIES PRESENT, SUGGEST RECOLLECTION   Report Status 12/22/2020 FINAL  Final     Labs: BNP (last 3 results) Recent Labs    12/16/20 0100 12/17/20 0539 12/18/20 0057  BNP 1,574.3* 1,835.4* [id number]*   Basic Metabolic Panel: Recent Labs  Lab 12/19/20 0123 12/19/20 1639 12/20/20 0609 12/21/20 0034 12/22/20 0129  NA 132* 132* 135 133* 136  K 6.0* 5.7* 5.5* 5.3* 4.9  CL 100 100 101 100 103  CO2 '26 25 27 24 24  '$ GLUCOSE 131* 112* 100* 194* 200*  BUN 115* 120* 120* 120* 117*  CREATININE 3.41* 3.37* 3.26* 3.32* 3.26*  CALCIUM 8.2* 8.3* 8.5* 8.5*  8.5*   Liver Function Tests: Recent Labs  Lab 12/16/20 0100 12/17/20 0539 12/18/20 0057  AST '16 17 17  '$ ALT '10 11 11  '$ ALKPHOS 77 74 81  BILITOT 0.4 0.2* 0.5  PROT 6.3* 6.5 6.5  ALBUMIN 1.3* 1.3* 1.3*   No results for input(s): LIPASE, AMYLASE in the last 168 hours. No results for input(s): AMMONIA in the last 168 hours. CBC: Recent Labs  Lab 12/16/20 0100 12/17/20 0539 12/18/20 0057 12/19/20 0123 12/19/20 1639 12/20/20 0609 12/20/20 0905 12/20/20 1809 12/21/20 0034 12/21/20 1244 12/22/20 0129  WBC 12.3* 11.5* 10.8*   < > 9.0 8.8 9.1  --  9.4 10.1 9.7  NEUTROABS 9.4* 8.9* 8.6*  --  6.7  --   --   --   --  8.2*  --   HGB 7.7* 7.4* 7.4*   < > 7.7* 6.9* 7.4* 9.1* 8.8* 8.8* 8.6*  HCT 24.5* 23.9* 24.8*   < > 24.6* 22.5* 23.9* 28.9* 28.3* 27.7* 27.1*  MCV 79.5* 80.5 84.6   < > 82.6 83.0 82.7  --  84.5 85.5 86.0  PLT 461* 482* 364   < > 408* 382 384  --   390 374  410* 373   < > = values in this interval not displayed.   Cardiac Enzymes: No results for input(s): CKTOTAL, CKMB, CKMBINDEX, TROPONINI in the last 168 hours. BNP: Invalid input(s): POCBNP CBG: Recent Labs  Lab 12/21/20 1650 12/21/20 1939 12/21/20 2125 12/22/20 0724 12/22/20 1150  GLUCAP 200* 213* 226* 167* 131*   D-Dimer Recent Labs    12/21/20 1244  DDIMER 2.96*   Hgb A1c No results for input(s): HGBA1C in the last 72 hours. Lipid Profile No results for input(s): CHOL, HDL, LDLCALC, TRIG, CHOLHDL, LDLDIRECT in the last 72 hours. Thyroid function studies No results for input(s): TSH, T4TOTAL, T3FREE, THYROIDAB in the last 72 hours.  Invalid input(s): FREET3 Anemia work up No results for input(s): VITAMINB12, FOLATE, FERRITIN, TIBC, IRON, RETICCTPCT in the last 72 hours. Urinalysis    Component Value Date/Time   COLORURINE YELLOW 12/18/2020 1145   APPEARANCEUR HAZY (A) 12/18/2020 1145   LABSPEC 1.014 12/18/2020 1145   PHURINE 5.0 12/18/2020 1145   GLUCOSEU NEGATIVE 12/18/2020 1145   HGBUR MODERATE (A) 12/18/2020 1145   BILIRUBINUR NEGATIVE 12/18/2020 1145   KETONESUR NEGATIVE 12/18/2020 1145   PROTEINUR 100 (A) 12/18/2020 1145   UROBILINOGEN 1.0 09/06/2014 2307   NITRITE NEGATIVE 12/18/2020 1145   LEUKOCYTESUR SMALL (A) 12/18/2020 1145   Sepsis Labs Invalid input(s): PROCALCITONIN,  WBC,  LACTICIDVEN Microbiology Recent Results (from the past 240 hour(s))  Culture, Urine     Status: Abnormal   Collection Time: 12/19/20 12:33 PM   Specimen: Urine, Random  Result Value Ref Range Status   Specimen Description URINE, RANDOM  Final   Special Requests   Final    NONE Performed at [facility], [place] [address], [place], [place] [address]    Culture MULTIPLE SPECIES PRESENT, SUGGEST RECOLLECTION (A)  Final   Report Status 12/20/2020 FINAL  Final  Culture, Urine     Status: Abnormal   Collection Time: 12/21/20  7:27 AM   Specimen: Urine, Random   Result Value Ref Range Status   Specimen Description URINE, RANDOM  Final   Special Requests   Final    NONE Performed at [facility], [place] [address] [place], [place] [address]    Culture (A)  Final    30,000 COLONIES/mL  MULTIPLE SPECIES PRESENT, SUGGEST RECOLLECTION   Report Status 12/22/2020 FINAL  Final     Time coordinating discharge: 35 minutes The [place] controlled substances registry was reviewed for this patient     30 Day Unplanned Readmission Risk Score    Flowsheet Row Admission (Current) from 12/10/2020 in [facility]  30 Day Unplanned Readmission Risk Score (%) 27.6 Filed at 12/22/2020 1200       This score is the patient's risk of an unplanned readmission within 30 days of being discharged (0 -100%). The score is based on dignosis, age, lab data, medications, orders, and past utilization.   Low:  0-14.9   Medium: 15-21.9   High: 22-29.9   Extreme: 30 and above             SIGNED:   Edwin Dada, MD  [facility] 12/22/2020, 12:06 PM

## 2020-12-22 NOTE — Progress Notes (Signed)
[facility] ROUNDING NOTE   Subjective:   Interval History: This is a 50 year old gentleman with a history of hypertension diabetes status post BKA for left foot infection 12/10/2020 stage III chronic kidney disease.  Developed acute kidney injury secondary to acute tubular necrosis.  Serologies negative including ANA, ANCA, anti-GBM and antidouble-stranded DNA.  Creatinine baseline 1.7 mg/dL.  Blood pressure 147/80 pulse 85 temperature 98.1 O2 sats 97%  Sodium 136 potassium 4.9 chloride 103 CO2 24 BUN 117 creatinine 3.26 glucose 200 calcium 8.5 hemoglobin 8.6  Urine output 200 cc 12/21/2020    Objective:  Vital signs in last 24 hours:  Temp:  [98 F (36.7 C)-98.7 F (37.1 C)] 98.1 F (36.7 C) (06/20 0400) Pulse Rate:  [85-91] 85 (06/20 0400) Resp:  [17-18] 17 (06/20 0400) BP: (143-147)/(80-91) 147/80 (06/20 0400) SpO2:  [97 %-98 %] 97 % (06/20 0400)  Weight change:  Filed Weights   12/10/20 1139  Weight: 79.4 kg    Intake/Output: I/O last 3 completed shifts: In: 600 [P.O.:600] Out: 500 [Urine:500]   Intake/Output this shift:  No intake/output data recorded.  GEN NAD, sitting in bed HEENT blind NECK no JVD PULM clear CV RRR ABD soft EXT L BKA in sock, no bleeding, R leg 1+ LE edema NEURO AAO x 3   Basic Metabolic Panel: Recent Labs  Lab 12/19/20 0123 12/19/20 1639 12/20/20 0609 12/21/20 0034 12/22/20 0129  NA 132* 132* 135 133* 136  K 6.0* 5.7* 5.5* 5.3* 4.9  CL 100 100 101 100 103  CO2 '26 25 27 24 24  '$ GLUCOSE 131* 112* 100* 194* 200*  BUN 115* 120* 120* 120* 117*  CREATININE 3.41* 3.37* 3.26* 3.32* 3.26*  CALCIUM 8.2* 8.3* 8.5* 8.5* 8.5*    Liver Function Tests: Recent Labs  Lab 12/16/20 0100 12/17/20 0539 12/18/20 0057  AST '16 17 17  '$ ALT '10 11 11  '$ ALKPHOS 77 74 81  BILITOT 0.4 0.2* 0.5  PROT 6.3* 6.5 6.5  ALBUMIN 1.3* 1.3* 1.3*   No results for input(s): LIPASE, AMYLASE in the last 168 hours. No results for input(s):  AMMONIA in the last 168 hours.  CBC: Recent Labs  Lab 12/16/20 0100 12/17/20 0539 12/18/20 0057 12/19/20 0123 12/19/20 1639 12/20/20 0609 12/20/20 0905 12/20/20 1809 12/21/20 0034 12/21/20 1244 12/22/20 0129  WBC 12.3* 11.5* 10.8*   < > 9.0 8.8 9.1  --  9.4 10.1 9.7  NEUTROABS 9.4* 8.9* 8.6*  --  6.7  --   --   --   --  8.2*  --   HGB 7.7* 7.4* 7.4*   < > 7.7* 6.9* 7.4* 9.1* 8.8* 8.8* 8.6*  HCT 24.5* 23.9* 24.8*   < > 24.6* 22.5* 23.9* 28.9* 28.3* 27.7* 27.1*  MCV 79.5* 80.5 84.6   < > 82.6 83.0 82.7  --  84.5 85.5 86.0  PLT 461* 482* 364   < > 408* 382 384  --  390 374  410* 373   < > = values in this interval not displayed.    Cardiac Enzymes: No results for input(s): CKTOTAL, CKMB, CKMBINDEX, TROPONINI in the last 168 hours.  BNP: Invalid input(s): POCBNP  CBG: Recent Labs  Lab 12/21/20 0752 12/21/20 1152 12/21/20 1650 12/21/20 1939 12/21/20 2125  GLUCAP 100* 106* 200* 213* 46*    Microbiology: Results for orders placed or performed during the hospital encounter of 12/10/20  SARS Coronavirus 2 by RT PCR (hospital order, performed in [facility] hospital lab) Nasopharyngeal Nasopharyngeal Swab  Status: None   Collection Time: 12/10/20 11:25 AM   Specimen: Nasopharyngeal Swab  Result Value Ref Range Status   SARS Coronavirus 2 NEGATIVE NEGATIVE Final    Comment: Performed at [facility], [place] [address] [place], [place] [address]  Culture, blood (routine x 2)     Status: None   Collection Time: 12/10/20  7:01 PM   Specimen: BLOOD  Result Value Ref Range Status   Specimen Description BLOOD SITE NOT SPECIFIED  Final   Special Requests   Final    BOTTLES DRAWN AEROBIC AND ANAEROBIC Blood Culture adequate volume   Culture   Final    NO GROWTH 5 DAYS Performed at [facility], [place] [address], [place], [place] [address]    Report Status 12/15/2020 FINAL  Final  Culture, blood (routine x 2)     Status: None   Collection Time: 12/10/20  7:01  PM   Specimen: BLOOD  Result Value Ref Range Status   Specimen Description BLOOD SITE NOT SPECIFIED  Final   Special Requests   Final    BOTTLES DRAWN AEROBIC ONLY Blood Culture results may not be optimal due to an inadequate volume of blood received in culture bottles   Culture   Final    NO GROWTH 5 DAYS Performed at [facility], [place] [address] [place], [place] [address]    Report Status 12/15/2020 FINAL  Final  Culture, Urine     Status: Abnormal   Collection Time: 12/19/20 12:33 PM   Specimen: Urine, Random  Result Value Ref Range Status   Specimen Description URINE, RANDOM  Final   Special Requests   Final    NONE Performed at [facility], [place] [address] [place], [place] [address]    Culture MULTIPLE SPECIES PRESENT, SUGGEST RECOLLECTION (A)  Final   Report Status 12/20/2020 FINAL  Final    Coagulation Studies: Recent Labs    12/21/20 1244  LABPROT 16.9*  INR 1.4*    Urinalysis: No results for input(s): COLORURINE, LABSPEC, PHURINE, GLUCOSEU, HGBUR, BILIRUBINUR, KETONESUR, PROTEINUR, UROBILINOGEN, NITRITE, LEUKOCYTESUR in the last 72 hours.  Invalid input(s): APPERANCEUR    Imaging: No results found.   Medications:     sodium chloride   Intravenous Once   vitamin C  1,000 mg Oral Daily   atorvastatin  20 mg Oral Daily   carvedilol  12.5 mg Oral BID WC   docusate sodium  200 mg Oral BID   ferrous sulfate  325 mg Oral TID WC   folic acid  1 mg Oral Daily   gabapentin  300 mg Oral QHS   heparin injection (subcutaneous)  5,000 Units Subcutaneous Q8H   insulin aspart  0-5 Units Subcutaneous QHS   insulin aspart  0-9 Units Subcutaneous TID WC   insulin aspart  3 Units Subcutaneous TID WC   insulin glargine  25 Units Subcutaneous Daily   isosorbide mononitrate  30 mg Oral Daily   multivitamin with minerals  1 tablet Oral Daily   nutrition supplement (JUVEN)  1 packet Oral BID BM   pantoprazole  40 mg Oral BID   Ensure Max Protein  11 oz Oral  QHS   sodium zirconium cyclosilicate  10 g Oral BID   tamsulosin  0.4 mg Oral Daily   zinc sulfate  220 mg Oral Daily   acetaminophen, alum & mag hydroxide-simeth, guaiFENesin-dextromethorphan, hydrALAZINE, naphazoline-glycerin, ondansetron, oxyCODONE, phenol, simethicone  Assessment/ Plan:  Acute on chronic kidney  disease baseline serum creatinine 1.7 mg/dL.  Initial evaluation showed hematuria without stones and renal ultrasound 12/11/2020.  Complete work-up of serologies were negative.  No real compulsion to proceed with renal biopsy.  Appears to be recovering.  Complements are within normal range unlikely to represent acute GN. Diabetes mellitus as per primary service Hyperkalemia patient treated with Lokelma this appears to have resolved.  Lokelma at this point Anemia status post transfusion considering occult causes of bleeding Left foot osteostatus post left BKA per Dr. Sharol Given 12/11/2018   LOS: [place] '@TODAY''@7'$ :06 AM

## 2020-12-22 NOTE — Progress Notes (Addendum)
Todd Mccoy to be D/C'd Home per MD order. D/C instructions discussed with the patient and questions answered.  An After Visit Summary was printed and given to the patient.  Patient escorted via [place], and D/C home via private auto. Patient belongings and valuables sent with patient. DME given to patient.  Spoke with patient's mom, they live 15 minutes away from the hospital and will pick up the 3in1 [employer] tomorrow.   Update from CM: 3in1 to be delivered to patient's home with the [place]  12/22/2020 3:48 PM

## 2020-12-23 ENCOUNTER — Telehealth: Payer: Self-pay

## 2020-12-23 LAB — HAPTOGLOBIN: Haptoglobin: 163 mg/dL (ref 23–355)

## 2020-12-23 NOTE — Progress Notes (Addendum)
Received message from [place] with [facility]. They cannot arrange [facility] for the patient only HHPT. Corene Cornea with [facility] called and they can accept the patient for HHRN/PT. VM left with patient about the change in his [place] arrangements. Aneta Mins Transition of Care Supervisor [phone]  2:06 pm- Talked to patient via phone concerning [place] services, all questions answered. Mindi Slicker RN,MHA,[name]

## 2020-12-23 NOTE — Telephone Encounter (Signed)
Transition Care Management Follow-up Telephone Call Date of discharge and from where: 12/22/2020, [facility] How have you been since you were released from the hospital? He said he is not doing too well, having difficulty moving due to scrotal edema.  Any questions or concerns? Yes -noted above  Items Reviewed: Did the pt receive and understand the discharge instructions provided? Yes - He needs to review the instructions again.  He also said that he needs to check what supplies, if any, he was sent home with.  He did say that he has some "protectors "  for his stump but was not sure if he has any other dressings/supplies for the surgical site.  Medications obtained and verified? Yes  -he said that he has all of his medications and did not have any questions about his med regime. He explained that his family checks his blood sugars for him and manages his medications because of his loss of vision.  Other? No  Any new allergies since your discharge? No  Do you have support at home? Yes   Home Care and Equipment/Supplies: Were home health services ordered? yes If so, what is the name of the agency? [facility]  Has the agency set up a time to come to the patient's home? Not yet- this CM called [facility], spoke to [place] who said she would need to contact their hospital liaison to check on the status of the referral.   Were any new equipment or medical supplies ordered?  Yes: wheelchair and 3:1 commode What is the name of the medical supply agency? [facility] Were you able to get the supplies/equipment? He received the wheelchair but the 3:1 has not been delivered yet.  Do you have any questions related to the use of the equipment or supplies? No  Functional Questionnaire: (I = Independent and D = Dependent) ADLs: needs assistance with ADLs.  He said he has someone with him all of the time. His son provides needed assistance as well as his nephew and niece.    Follow up appointments  reviewed:  PCP Hospital f/u appt confirmed? Yes  Scheduled to see Lazaro Arms, NP at [facility]  12/29/2020 and then Dr Margarita Rana at [facility] 01/21/2021. The appointment with Dr Margarita Rana can be cancelled/rescheduled as needed. [facility] f/u appt confirmed?  He does not have appointments with Dr Sharol Given or nephrology yet  He said that they are supposed to call him with appointments.    Are transportation arrangements needed?  He is not sure yet.  He said that there are 3 steps into his home and there is no ramp. He will most likely need to bump up and down the stairs to enter/exit. If their condition worsens, is the pt aware to call PCP or go to the Emergency Dept.? Yes Was the patient provided with contact information for the PCP's office or ED? Yes -he was given the number for  [facility] as well as [facility] Was to pt encouraged to call back with questions or concerns? Yes

## 2020-12-25 ENCOUNTER — Telehealth: Payer: Self-pay | Admitting: Orthopedic Surgery

## 2020-12-25 NOTE — Telephone Encounter (Signed)
I called and lm on vm to advise verbal ok for orders as below to call with any questions.

## 2020-12-25 NOTE — Telephone Encounter (Signed)
Received call from Kahi [place]ala (PT) with [facility] needing HHPT resumption of care 1 Wk 4. The number to contact Madelyn Flavors is [phone]

## 2020-12-29 ENCOUNTER — Ambulatory Visit: Payer: Self-pay

## 2020-12-30 ENCOUNTER — Encounter: Payer: Self-pay | Admitting: Family

## 2020-12-30 ENCOUNTER — Ambulatory Visit (INDEPENDENT_AMBULATORY_CARE_PROVIDER_SITE_OTHER): Payer: Self-pay | Admitting: Family

## 2020-12-30 DIAGNOSIS — Z89512 Acquired absence of left leg below knee: Secondary | ICD-10-CM

## 2020-12-30 MED ORDER — AMOXICILLIN 875 MG PO TABS: 875.0000 mg | ORAL_TABLET | Freq: Two times a day (BID) | ORAL | 0 refills | Status: DC

## 2020-12-30 NOTE — Progress Notes (Signed)
Post-Op Visit Note   Patient: Todd Mccoy           Date of Birth: Nov 16, 1970           MRN: [id number] Visit Date: 12/30/2020 PCP: [name], MD  Chief Complaint:  Chief Complaint  Patient presents with   Left Leg - Follow-up    HPI:  HPI The patient is a 50 year old gentleman seen status post left below-knee amputation staples are in place.  No concerns voiced.  Ortho Exam On examination of the left residual limb this is well-healed staples harvested today without incident there is no drainage no erythema.  Limb is consolidating well.  Visit Diagnoses:  1. S/P BKA (below knee amputation) unilateral, left ([place])     Plan: Staples harvested.  He will continue wearing his shrinker with direct skin contact.  Did give an order for his prosthesis set up to [employer].  Follow-Up Instructions: Return in about 3 weeks (around 01/20/2021).   Imaging: No results found.  Orders:  No orders of the defined types were placed in this encounter.  No orders of the defined types were placed in this encounter.    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[place])    Cellulitis and abscess of foot    Toe osteomyelitis ([place]) 03/24/2020   Necrotizing fasciitis ([place]) 09/08/2014   Diabetes mellitus with hyperglycemia ([place]) 09/07/2014   Tobacco abuse 09/07/2014   Abscess of back    Abscess of lower back 09/06/2014   DM2 (diabetes mellitus, type 2) ([place]) 09/06/2014   Sepsis ([place]) 09/06/2014   Scrotal abscess  06/20/2014   Past Medical History:  Diagnosis Date   Anemia    Anxiety    Diabetes mellitus without complication ([place])    Diabetic neuropathy ([place])    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[place]) 03/25/2020   pt states retinal eye damage- left worse than the right- recent visited MD    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[place]) 03/25/2020   pt states recent MD visit /left eye worse than right   Dyslipidemia 12/10/2020   Hypertension    Pneumonia    Stage 3a chronic kidney disease ([place]) 12/10/2020    Family History  Problem Relation Age of Onset   Diabetes Mother    Diabetes Father    Diabetes Brother    Colon cancer Neg Hx    Esophageal cancer Neg Hx    Pancreatic cancer Neg Hx    Stomach cancer Neg Hx    Liver disease Neg Hx     Past Surgical History:  Procedure Laterality Date   ABSCESS DRAINAGE     neck   AMPUTATION Left 11/14/2020   Procedure: LEFT 5TH RAY AMPUTATION;  Surgeon: Newt Minion, MD;  Location: [facility];  Service: Orthopedics;  Laterality: Left;   AMPUTATION Left 12/10/2020   Procedure: LEFT BELOW KNEE AMPUTATION;  Surgeon: Newt Minion, MD;  Location: [facility];  Service: Orthopedics;  Laterality: Left;  AMPUTATION TOE Right 03/25/2020   Procedure: AMPUTATION TOE;  Surgeon: Evelina Bucy, DPM;  Location: [facility];  Service: Podiatry;  Laterality: Right;   INCISION AND DRAINAGE ABSCESS Right 09/07/2014   Procedure: INCISION AND DRAINAGE ABSCESS RIGHT FLANK;  Surgeon: Jackolyn Confer, MD;  Location: [facility];  Service: General;  Laterality: Right;   LEG AMPUTATION BELOW KNEE Left 12/10/2020   SCROTUM EXPLORATION     Social History   Occupational History   Not on file  Tobacco Use   Smoking status: Every Day    Packs/day: 1.00    Years: 32.00    Pack years: 32.00    Types: Cigarettes   Smokeless tobacco: Never   Tobacco comments:    Currently down to 1/4 ppd  Substance and Sexual Activity   Alcohol use: No   Drug use: No   Sexual activity: Not on file

## 2020-12-31 ENCOUNTER — Ambulatory Visit: Payer: Self-pay

## 2021-01-01 ENCOUNTER — Telehealth: Payer: Self-pay

## 2021-01-01 ENCOUNTER — Other Ambulatory Visit (HOSPITAL_COMMUNITY): Payer: Self-pay

## 2021-01-01 NOTE — Telephone Encounter (Signed)
I called pt and advised that he should contact his PCP for evaluation. Per PA the amoxicillin should not upset his tummy and if he is ahving pain with eating he needs evaluation.

## 2021-01-01 NOTE — Telephone Encounter (Signed)
Patient called stating that he is having abdominal pain.  Stated that his stomach hurts when he eats.  I did advise patient that he may need to contact his PCP.  Cb# [phone].  Please advise.  Thank you.

## 2021-01-02 ENCOUNTER — Other Ambulatory Visit: Payer: Self-pay | Admitting: Family Medicine

## 2021-01-02 ENCOUNTER — Other Ambulatory Visit (HOSPITAL_COMMUNITY): Payer: Self-pay

## 2021-01-02 NOTE — Telephone Encounter (Signed)
   Notes to clinic:  Medication filled by a different provider Review for refill    Requested Prescriptions  Pending Prescriptions Disp Refills   furosemide (LASIX) 40 MG tablet 10 tablet 0    Sig: Take 1 tablet (40 mg total) by mouth daily for 10 days.      Cardiovascular:  Diuretics - Loop Failed - 01/02/2021  2:40 PM      Failed - Ca in normal range and within 360 days    Calcium  Date Value Ref Range Status  12/22/2020 8.5 (L) 8.9 - 10.3 mg/dL Final          Failed - Cr in normal range and within 360 days    Creatinine, Ser  Date Value Ref Range Status  12/22/2020 3.26 (H) 0.61 - 1.24 mg/dL Final   Creatinine, Urine  Date Value Ref Range Status  12/18/2020 115.81 mg/dL Final          Failed - Last BP in normal range    BP Readings from Last 1 Encounters:  12/22/20 (!) 157/95          Passed - K in normal range and within 360 days    Potassium  Date Value Ref Range Status  12/22/2020 4.9 3.5 - 5.1 mmol/L Final          Passed - Na in normal range and within 360 days    Sodium  Date Value Ref Range Status  12/22/2020 136 135 - 145 mmol/L Final  10/22/2020 145 (H) 134 - 144 mmol/L Final          Passed - Valid encounter within last 6 months    Recent Outpatient Visits           2 months ago 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 complication, unspecified whether long term insulin use ([place])   [facility] [place], Charlane Ferretti, MD   7 months ago 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 complication, unspecified whether long term insulin use ([place])   [place], Annie Main L, RPH-CPP   7 months ago 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 complication, unspecified whether long term insulin use [facility])   [place], Annie Main L, RPH-CPP   8 months ago 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 complication, unspecified whether long term insulin use [facility])    [place], [place], [place]   6 years ago Type 2 diabetes mellitus without complication   [place], NP       Future Appointments             In 2 weeks [name], MD [place]

## 2021-01-02 NOTE — Telephone Encounter (Signed)
Copied from [place] [phone]. Topic: Quick Communication - Rx Refill/Question >> Jan 02, 2021  2:37 PM Tessa Lerner A wrote: Medication: furosemide (LASIX) 40 MG tablet   Has the patient contacted their pharmacy? No. (Agent: If no, request that the patient contact the pharmacy for the refill.) (Agent: If yes, when and what did the pharmacy advise?)  Preferred Pharmacy (with phone number or street name): [place] ([place]), [place] - [address]  Phone:  [phone] Fax:  [phone]  Agent: Please be advised that RX refills may take up to 3 business days. We ask that you follow-up with your pharmacy.

## 2021-01-07 ENCOUNTER — Other Ambulatory Visit: Payer: Self-pay

## 2021-01-07 ENCOUNTER — Other Ambulatory Visit (HOSPITAL_COMMUNITY): Payer: Self-pay

## 2021-01-08 ENCOUNTER — Telehealth: Payer: Self-pay | Admitting: Family Medicine

## 2021-01-08 ENCOUNTER — Telehealth: Payer: Self-pay

## 2021-01-08 NOTE — Telephone Encounter (Signed)
Copied from [place] [phone]. Topic: General - Other >> Jan 01, 2021  9:41 AM Leward Quan A wrote: Reason for CRM: Patient sister Crystal called in to inquire of Dr Margarita Rana to please submit a referral for transportation since patient is wheelchair bound so that patient can have a way to and from his appointments. Asking for a call back at Ph# [phone] or [phone]

## 2021-01-08 NOTE — Telephone Encounter (Signed)
Copied from [place] [id number]. Topic: General - Call Back - No Documentation >> Jan 02, 2021  1:24 PM Erick Blinks wrote: Reason for CRM: Pt's mother is requesting a call back from [facility], she says patient missed last appt due to lack of transportation. She has questions she says   Best contact: [phone]

## 2021-01-08 NOTE — Telephone Encounter (Signed)
Call placed to patient and sister answered the phone she was informed that patient has an upcoming visit on7/20 and he will need transportation for the visit.   Can patient be set up with transportation to appointment.

## 2021-01-08 NOTE — Telephone Encounter (Signed)
Copied from [place] [phone]. Topic: Referral - Request for Referral >> Jan 02, 2021  1:21 PM Erick Blinks wrote: Has patient seen PCP for this complaint? Yes.   *If NO, is insurance requiring patient see PCP for this issue before PCP can refer them? Referral for which specialty: Transportation services  Preferred provider/office:  Reason for referral: Pt is blind and wheelchair bound   Pt's mother Molli Posey called and reported that she was told he needed a referral   Best contact: [phone] Or [phone]

## 2021-01-08 NOTE — Telephone Encounter (Signed)
Following up on a request to contact the patient and their family to set up transportation. LVM for the patient to call [facility] at their earliest.

## 2021-01-08 NOTE — Telephone Encounter (Signed)
Call received from patient's sister, Mackie Pai.  She reported that her mother is in the hospital and she resides with him.  Mackie Pai stated that her brother is blind and she would like this CM to call him to inquire what help he may need. She said that if he can't be reached to contact his niece # [phone].  Calls placed to patient # [phone] and to his niece. Messages left at both numbers requesting a call back to this CM.  He may need transportation to his upcoming appointments

## 2021-01-15 ENCOUNTER — Telehealth: Payer: Self-pay | Admitting: Family Medicine

## 2021-01-15 NOTE — Telephone Encounter (Signed)
Call returned to patient.  He is looking for help at home with ADLs.  He is uninsured and has applied for Medicaid.  Explained to him that he will need Medicaid in placed to apply for the personal care services. He currently has home health PT with [facility], not [facility]. It is  charity care that was set up when he was in the hospital.  He explained that he has a ramp and is able to get out of his home.  He may need assistance getting his wheelchair over the door threshold but his is pleased to have the ramp.  He said he has transportation arranged for his appointment at [facility] next week.

## 2021-01-15 NOTE — Telephone Encounter (Signed)
Copied from [place] [phone]. Topic: General - Other >> Jan 12, 2021  2:20 PM Tessa Lerner A wrote: Reason for CRM: Patient has returned a call to speak with staff member J. Scarlette Ar when possible  Please contact further

## 2021-01-16 ENCOUNTER — Telehealth: Payer: Self-pay | Admitting: Family Medicine

## 2021-01-16 NOTE — Telephone Encounter (Signed)
Pt would like help /assistance with setting up transportation to and from his appts.  Pt is legally blind and an amputee, and would like his sister to be aable to get info for him.  Pt wants to sign DPR and put her name on it. Sister is calling on 3 way with him to make sure he can get help to set up transportation.

## 2021-01-19 ENCOUNTER — Telehealth: Payer: Self-pay

## 2021-01-19 NOTE — Telephone Encounter (Signed)
Yes for home health. If the wound is the left BKA stump not anything needed but for him to wear shrinker against skin. If it is a new wound then we would need to see him

## 2021-01-19 NOTE — Telephone Encounter (Signed)
I left voicemail for Allie advising.

## 2021-01-19 NOTE — Telephone Encounter (Signed)
Ok for orders? 

## 2021-01-19 NOTE — Telephone Encounter (Signed)
Allie, physical therapist from [facility], called triage line this morning. She wants verbal orders to continue home health 1wk X1, and 1wk X4. She also states that patient has a wound on his left side and wants an order requesting nursing for wound care. Call back [phone] Thanks!

## 2021-01-20 ENCOUNTER — Encounter: Payer: Self-pay | Admitting: Orthopedic Surgery

## 2021-01-20 ENCOUNTER — Ambulatory Visit (INDEPENDENT_AMBULATORY_CARE_PROVIDER_SITE_OTHER): Payer: Self-pay | Admitting: Physician Assistant

## 2021-01-20 ENCOUNTER — Other Ambulatory Visit: Payer: Self-pay | Admitting: Physician Assistant

## 2021-01-20 VITALS — Ht 70.0 in | Wt 175.0 lb

## 2021-01-20 DIAGNOSIS — Z89512 Acquired absence of left leg below knee: Secondary | ICD-10-CM

## 2021-01-20 MED ORDER — OXYCODONE HCL 5 MG PO TABS: 5.0000 mg | ORAL_TABLET | ORAL | 0 refills | Status: DC | PRN

## 2021-01-20 NOTE — Progress Notes (Signed)
Office Visit Note   Patient: Todd Mccoy           Date of Birth: 08/17/1970           MRN: [id number] Visit Date: 01/20/2021              Requested by: [name], MD [address] [place],  [place] [address] PCP: [name], MD  Chief Complaint  Patient presents with   Left Leg - Follow-up    Left below knee amputation 12/10/2020      HPI: Patient presents in follow-up today he is 6 weeks status post left below-knee amputation.  He has not gotten any refills of pain medication and is requesting a refill.  He has not yet made an appointment with [employer] to begin the process to get a prosthetic.  He does have a quite a bit of generalized swelling even in his legs and arms.  He says he has an appointment with his primary care doctor to address this this week  Assessment & Plan: Visit Diagnoses: No diagnosis found.  Plan: Patient will meet with [employer] also provided a prescription for to shrinkers as the 1 he currently has to bed.  Follow-up in 4 weeks  Follow-Up Instructions: No follow-ups on file.   Ortho Exam  Patient is alert, oriented, no adenopathy, well-dressed, normal affect, normal respiratory effort. Examination demonstrates overall well-healed incision.  There is a small superficial dehiscence about 2 cm with healthy vascular tissue.  Does not probe deeply.  No surrounding cellulitis or signs of infection he does have significant swelling in the amputation stump but also throughout his other leg and arms as well  Imaging: No results found. No images are attached to the encounter.  Labs: Lab Results  Component Value Date   HGBA1C 8.6 (H) 11/12/2020   HGBA1C 8.3 (H) 11/12/2020   HGBA1C 8.0 (A) 10/22/2020   ESRSEDRATE 134 (H) 12/10/2020   ESRSEDRATE 126 (H) 03/24/2020   CRP 29.8 (H) 12/10/2020   LABURIC 9.1 (H) 12/15/2020   REPTSTATUS 12/22/2020 FINAL 12/21/2020   GRAMSTAIN  11/07/2019    NO WBC SEEN ABUNDANT GRAM POSITIVE COCCI FEW GRAM  POSITIVE RODS    CULT (A) 12/21/2020    30,000 COLONIES/mL MULTIPLE SPECIES PRESENT, SUGGEST RECOLLECTION   LABORGA STAPHYLOCOCCUS AUREUS 11/07/2019     Lab Results  Component Value Date   ALBUMIN 1.3 (L) 12/18/2020   ALBUMIN 1.3 (L) 12/17/2020   ALBUMIN 1.3 (L) 12/16/2020   PREALBUMIN 5.5 (L) 12/10/2020    Lab Results  Component Value Date   MG 2.0 12/14/2020   MG 1.9 12/13/2020   MG 2.0 12/12/2020   No results found for: VD25OH  Lab Results  Component Value Date   PREALBUMIN 5.5 (L) 12/10/2020   CBC EXTENDED Latest Ref Rng & Units 12/22/2020 12/21/2020 12/21/2020  WBC 4.0 - 10.5 K/uL 9.7 - 10.1  RBC 4.22 - 5.81 MIL/uL 3.15(L) - 3.24(L)  HGB 13.0 - 17.0 g/dL 8.6(L) - 8.8(L)  HCT 39.0 - 52.0 % 27.1(L) - 27.7(L)  PLT 150 - 400 K/uL 373 374 410(H)  NEUTROABS 1.7 - 7.7 K/uL - - 8.2(H)  LYMPHSABS 0.7 - 4.0 K/uL - - 1.1     Body mass index is 25.11 kg/m.  Orders:  No orders of the defined types were placed in this encounter.  Meds ordered this encounter  Medications   oxyCODONE (OXY IR/ROXICODONE) 5 MG immediate release tablet    Sig: Take 1 tablet (5  mg total) by mouth every 4 (four) hours as needed for moderate pain (pain score 4-6).    Dispense:  30 tablet    Refill:  0     Procedures: No procedures performed  Clinical Data: No additional findings.  ROS:  All other systems negative, except as noted in the HPI. Review of Systems  Objective: Vital Signs: Ht '5\' 10"'$  (1.778 m)   Wt 175 lb (79.4 kg)   BMI 25.11 kg/m   Specialty Comments:  No specialty comments available.  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[place])    Cellulitis and abscess of foot    Toe osteomyelitis ([place]) 03/24/2020   Necrotizing fasciitis ([facility]) 09/08/2014   Diabetes mellitus with hyperglycemia ([facility]) 09/07/2014   Tobacco abuse 09/07/2014   Abscess of back    Abscess of lower back 09/06/2014   DM2 (diabetes mellitus, type 2) ([facility]) 09/06/2014   Sepsis ([facility]) 09/06/2014   Scrotal abscess 06/20/2014   Past Medical History:  Diagnosis Date   Anemia    Anxiety    Diabetes mellitus without complication ([facility])    Diabetic neuropathy ([facility])    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[place]) 03/25/2020   pt states retinal eye damage- left worse than the right- recent visited MD    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[facility]) 03/25/2020   pt states recent MD visit /left eye worse than right   Dyslipidemia 12/10/2020   Hypertension    Pneumonia    Stage 3a chronic kidney disease ([place]) 12/10/2020    Family History  Problem Relation Age of Onset   Diabetes Mother    Diabetes Father    Diabetes Brother    Colon cancer Neg Hx    Esophageal cancer Neg Hx    Pancreatic cancer Neg Hx    Stomach cancer Neg Hx    Liver disease Neg Hx     Past Surgical History:  Procedure Laterality Date   ABSCESS DRAINAGE     neck   AMPUTATION Left 11/14/2020   Procedure: LEFT 5TH RAY AMPUTATION;  Surgeon: Newt Minion, MD;  Location: [facility];  Service: Orthopedics;  Laterality: Left;   AMPUTATION Left 12/10/2020   Procedure: LEFT BELOW KNEE AMPUTATION;  Surgeon: Newt Minion, MD;  Location: [facility];  Service: Orthopedics;  Laterality: Left;   AMPUTATION TOE Right 03/25/2020   Procedure: AMPUTATION TOE;  Surgeon: Evelina Bucy, DPM;  Location: [facility];  Service: Podiatry;  Laterality: Right;   INCISION AND DRAINAGE ABSCESS Right 09/07/2014   Procedure: INCISION AND DRAINAGE ABSCESS RIGHT FLANK;  Surgeon: Jackolyn Confer, MD;  Location: [facility];  Service: General;  Laterality: Right;   LEG AMPUTATION BELOW KNEE Left 12/10/2020   SCROTUM EXPLORATION     Social History   Occupational History   Not on file  Tobacco Use   Smoking status: Every Day    Packs/day: 1.00    Years: 32.00    Pack years: 32.00  Types: Cigarettes   Smokeless tobacco: Never   Tobacco comments:    Currently down to 1/4 ppd  Substance and Sexual Activity   Alcohol use: No   Drug use: No   Sexual activity: Not on file

## 2021-01-21 ENCOUNTER — Other Ambulatory Visit: Payer: Self-pay

## 2021-01-21 ENCOUNTER — Ambulatory Visit: Payer: Medicaid Other | Attending: Family Medicine | Admitting: Family Medicine

## 2021-01-21 ENCOUNTER — Encounter: Payer: Self-pay | Admitting: Family Medicine

## 2021-01-21 VITALS — BP 178/117 | HR 95

## 2021-01-21 DIAGNOSIS — R1084 Generalized abdominal pain: Secondary | ICD-10-CM | POA: Diagnosis not present

## 2021-01-21 DIAGNOSIS — E11628 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 complications: Secondary | ICD-10-CM

## 2021-01-21 DIAGNOSIS — I129 Hypertensive chronic kidney disease with stage 1 through stage 4 chronic kidney disease, or unspecified chronic kidney disease: Secondary | ICD-10-CM | POA: Diagnosis not present

## 2021-01-21 DIAGNOSIS — R600 Localized edema: Secondary | ICD-10-CM | POA: Diagnosis not present

## 2021-01-21 DIAGNOSIS — E1122 Type 2 diabetes mellitus with diabetic chronic kidney disease: Secondary | ICD-10-CM | POA: Diagnosis not present

## 2021-01-21 DIAGNOSIS — N184 Chronic kidney disease, stage 4 (severe): Secondary | ICD-10-CM

## 2021-01-21 DIAGNOSIS — Z89512 Acquired absence of left leg below knee: Secondary | ICD-10-CM | POA: Diagnosis not present

## 2021-01-21 DIAGNOSIS — E1142 Type 2 diabetes mellitus with diabetic polyneuropathy: Secondary | ICD-10-CM

## 2021-01-21 LAB — GLUCOSE, POCT (MANUAL RESULT ENTRY): POC Glucose: 112 mg/dl — AB (ref 70–99)

## 2021-01-21 MED ORDER — GABAPENTIN 300 MG PO CAPS: 300.0000 mg | ORAL_CAPSULE | Freq: Two times a day (BID) | ORAL | 5 refills | Status: DC

## 2021-01-21 MED ORDER — FUROSEMIDE 20 MG PO TABS
20.0000 mg | ORAL_TABLET | Freq: Every day | ORAL | 3 refills | Status: DC
  Filled 2021-01-21 – 2021-01-23 (×2): qty 30, 30d supply, fill #0

## 2021-01-21 MED ORDER — ISOSORBIDE MONONITRATE ER 30 MG PO TB24: 30.0000 mg | ORAL_TABLET | Freq: Every day | ORAL | 3 refills | Status: DC

## 2021-01-21 MED ORDER — GABAPENTIN 300 MG PO CAPS
300.0000 mg | ORAL_CAPSULE | Freq: Two times a day (BID) | ORAL | 5 refills | Status: DC
  Filled 2021-01-21 – 2021-01-23 (×2): qty 60, 30d supply, fill #0

## 2021-01-21 MED ORDER — CARVEDILOL 25 MG PO TABS: 25.0000 mg | ORAL_TABLET | Freq: Two times a day (BID) | ORAL | 3 refills | Status: DC

## 2021-01-21 MED ORDER — CARVEDILOL 25 MG PO TABS
25.0000 mg | ORAL_TABLET | Freq: Two times a day (BID) | ORAL | 3 refills | Status: DC
  Filled 2021-01-21 – 2021-01-23 (×2): qty 60, 30d supply, fill #0

## 2021-01-21 MED ORDER — ISOSORBIDE MONONITRATE ER 30 MG PO TB24
30.0000 mg | ORAL_TABLET | Freq: Every day | ORAL | 3 refills | Status: DC
  Filled 2021-01-21 – 2021-01-23 (×2): qty 30, 30d supply, fill #0

## 2021-01-21 MED ORDER — FUROSEMIDE 20 MG PO TABS
20.0000 mg | ORAL_TABLET | Freq: Every day | ORAL | 3 refills | Status: DC
Start: 2021-01-21 — End: 2021-01-21

## 2021-01-21 MED ORDER — POLYETHYLENE GLYCOL 3350 17 GM/SCOOP PO POWD: 17.0000 g | Freq: Two times a day (BID) | ORAL | 1 refills | Status: DC | PRN

## 2021-01-21 MED ORDER — POLYETHYLENE GLYCOL 3350 17 GM/SCOOP PO POWD
17.0000 g | Freq: Two times a day (BID) | ORAL | 1 refills | Status: DC | PRN
  Filled 2021-01-21 – 2021-01-23 (×2): qty 510, 15d supply, fill #0

## 2021-01-21 NOTE — Progress Notes (Signed)
Subjective:  Patient ID: Todd Mccoy, male    DOB: 1971/02/06  Age: 50 y.o. MRN: [id number]  CC: Diabetes   HPI Todd Mccoy is a 50 year old male with a history of type 2 diabetes mellitus (A1c 8.6), Diabetic retinopathy, Stage IV CKD hypertension, status post left BKA (secondary to osteomyelitis) here for chronic disease management.  Interval History: Seen by Ortho care yesterday and notes reveal he will be meeting with [facility] with regards to left lower extremity prosthesis.  Today he complains of b/l LE  edema and abdominal hardness. This is associated with pain, bloating and constipated. Last BM was yesterday.  Of note he has been on opioid analgesics. He ran out of Furosemide and since then he noticed edema.  His blood pressure is elevated in the clinic and he endorses taking his antihypertensives. BP has been elevated at home as well  He complains of persisting neuropathic pain Past Medical History:  Diagnosis Date   Anemia    Anxiety    Diabetes mellitus without complication ([facility])    Diabetic neuropathy ([place])    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[place]) 03/25/2020   pt states retinal eye damage- left worse than the right- recent visited MD    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[place]) 03/25/2020   pt states recent MD visit /left eye worse than right   Dyslipidemia 12/10/2020   Hypertension    Pneumonia    Stage 3a chronic kidney disease ([place]) 12/10/2020    Past Surgical History:  Procedure Laterality Date   ABSCESS DRAINAGE     neck   AMPUTATION Left 11/14/2020   Procedure: LEFT 5TH RAY AMPUTATION;  Surgeon: Newt Minion, MD;  Location: [facility];  Service: Orthopedics;  Laterality: Left;   AMPUTATION Left 12/10/2020   Procedure: LEFT BELOW KNEE AMPUTATION;  Surgeon: Newt Minion, MD;  Location: [facility];  Service: Orthopedics;  Laterality: Left;   AMPUTATION TOE Right 03/25/2020   Procedure: AMPUTATION TOE;  Surgeon: Evelina Bucy, DPM;  [facility];  Service: Podiatry;  Laterality: Right;   INCISION AND DRAINAGE ABSCESS Right 09/07/2014   Procedure: INCISION AND DRAINAGE ABSCESS RIGHT FLANK;  Surgeon: Jackolyn Confer, MD;  [facility];  Service: General;  Laterality: Right;   LEG AMPUTATION BELOW KNEE Left 12/10/2020   SCROTUM EXPLORATION      Family History  Problem Relation Age of Onset   Diabetes Mother    Diabetes Father    Diabetes Brother    Colon cancer Neg Hx    Esophageal cancer Neg Hx    Pancreatic cancer Neg Hx    Stomach cancer Neg Hx    Liver disease Neg Hx     Allergies  Allergen Reactions   Amlodipine Swelling    BLE edema    Outpatient Medications Prior to Visit  Medication Sig Dispense Refill   insulin glargine (LANTUS) 100 UNIT/ML Solostar Pen Inject 20 Units into the skin at bedtime. 18 mL 3   Insulin Pen Needle 32G X 4 MM MISC USE TO INJECT INSULIN DAILY. 100 each 3   oxyCODONE (OXY IR/ROXICODONE) 5 MG immediate release tablet Take 1 tablet (5 mg total) by mouth every 4 (four) hours as needed for moderate pain (pain score 4-6). 30 tablet 0   carvedilol (COREG) 12.5 MG tablet Take 1 tablet (12.5 mg total) by mouth 2 (two) times daily with a meal. 60 tablet 3   gabapentin (NEURONTIN) 300 MG capsule Take 1  capsule (300 mg total) by mouth at bedtime. 30 capsule 5   Insulin Syringe-Needle U-100 (SAFETY INSULIN SYRINGES) 30G X 1/2" 1 ML MISC 35 Units by Does not apply route 2 (two) times daily before a meal. 100 each 2   isosorbide mononitrate (IMDUR) 30 MG 24 hr tablet Take 1 tablet (30 mg total) by mouth daily. 30 tablet 3   atorvastatin (LIPITOR) 20 MG tablet Take 1 tablet (20 mg total) by mouth daily. (Patient not taking: Reported on 01/21/2021) 30 tablet 3   ferrous sulfate 325 (65 FE) MG tablet Take 1 tablet (325 mg total) by mouth 3 (three) times daily with meals. 90 tablet 2   amoxicillin (AMOXIL) 875 MG tablet Take 1 tablet (875 mg total) by mouth 2 (two) times daily. (Patient not taking:  Reported on 01/21/2021) 20 tablet 0   furosemide (LASIX) 40 MG tablet Take 1 tablet (40 mg total) by mouth daily for 10 days. 10 tablet 0   No facility-administered medications prior to visit.     ROS Review of Systems  Constitutional:  Negative for activity change and appetite change.  HENT:  Negative for sinus pressure and sore throat.   Eyes:  Negative for visual disturbance.  Respiratory:  Negative for cough, chest tightness and shortness of breath.   Cardiovascular:  Positive for leg swelling. Negative for chest pain.  Gastrointestinal:  Positive for abdominal pain. Negative for abdominal distention, constipation and diarrhea.  Endocrine: Negative.   Genitourinary:  Negative for dysuria.  Musculoskeletal:  Negative for joint swelling and myalgias.  Skin:  Negative for rash.  Allergic/Immunologic: Negative.   Neurological:  Negative for weakness, light-headedness and numbness.  Psychiatric/Behavioral:  Negative for dysphoric mood and suicidal ideas.    Objective:  BP (!) 178/117   Pulse 95   SpO2 99%   BP/Weight 01/21/2021 01/20/2021 07/23/4172  Systolic BP 081 - 448  Diastolic BP 185 - 95  Wt. (Lbs) - 175 -  BMI - 25.11 -      Physical Exam Constitutional:      Appearance: He is well-developed.  Neck:     Vascular: No JVD.  Cardiovascular:     Rate and Rhythm: Normal rate.     Heart sounds: Normal heart sounds. No murmur heard. Pulmonary:     Effort: Pulmonary effort is normal.     Breath sounds: Normal breath sounds. No wheezing or rales.  Chest:     Chest wall: No tenderness.  Abdominal:     General: Bowel sounds are normal. There is distension.     Palpations: Abdomen is soft. There is no mass.     Tenderness: There is no abdominal tenderness.     Comments: Edema of lower abdominal wall  Musculoskeletal:        General: Normal range of motion.     Right lower leg: Edema (2+ non pitting up to stump) present.     Left lower leg: Edema (2+ non pitting up to  stump) present.     Comments: L BKA  Neurological:     Mental Status: He is alert and oriented to person, place, and time.  Psychiatric:        Mood and Affect: Mood normal.    CMP Latest Ref Rng & Units 12/22/2020 12/21/2020 12/20/2020  Glucose 70 - 99 mg/dL 200(H) 194(H) 100(H)  BUN 6 - 20 mg/dL 117(H) 120(H) 120(H)  Creatinine 0.61 - 1.24 mg/dL 3.26(H) 3.32(H) 3.26(H)  Sodium 135 - 145 mmol/L 136 133(L)  135  Potassium 3.5 - 5.1 mmol/L 4.9 5.3(H) 5.5(H)  Chloride 98 - 111 mmol/L 103 100 101  CO2 22 - 32 mmol/L $RemoveB'24 24 27  'XICdYTah$ Calcium 8.9 - 10.3 mg/dL 8.5(L) 8.5(L) 8.5(L)  Total Protein 6.5 - 8.1 g/dL - - -  Total Bilirubin 0.3 - 1.2 mg/dL - - -  Alkaline Phos 38 - 126 U/L - - -  AST 15 - 41 U/L - - -  ALT 0 - 44 U/L - - -    Lipid Panel     Component Value Date/Time   CHOL 132 05/23/2020 1144   TRIG 60 05/23/2020 1144   HDL 42 05/23/2020 1144   CHOLHDL 3.1 05/23/2020 1144   LDLCALC 77 05/23/2020 1144    CBC    Component Value Date/Time   WBC 9.7 12/22/2020 0129   RBC 3.15 (L) 12/22/2020 0129   HGB 8.6 (L) 12/22/2020 0129   HGB 8.4 (L) 04/17/2020 1716   HCT 27.1 (L) 12/22/2020 0129   HCT 26.3 (L) 04/17/2020 1716   PLT 373 12/22/2020 0129   PLT 197 04/17/2020 1716   MCV 86.0 12/22/2020 0129   MCV 85 04/17/2020 1716   MCH 27.3 12/22/2020 0129   MCHC 31.7 12/22/2020 0129   RDW 18.9 (H) 12/22/2020 0129   RDW 14.8 04/17/2020 1716   LYMPHSABS 1.1 12/21/2020 1244   LYMPHSABS 1.9 04/17/2020 1716   MONOABS 0.6 12/21/2020 1244   EOSABS 0.1 12/21/2020 1244   EOSABS 0.1 04/17/2020 1716   BASOSABS 0.0 12/21/2020 1244   BASOSABS 0.0 04/17/2020 1716    Lab Results  Component Value Date   HGBA1C 8.6 (H) 11/12/2020    Assessment & Plan:  1. 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 complication, unspecified whether long term insulin use (HCC) Uncontrolled with A1c of 8.6 A1c is due at next visit at which time I will adjust his regimen if still above goal Counseled on  Diabetic diet, my plate method, 141 minutes of moderate intensity exercise/week Blood sugar logs with fasting goals of 80-120 mg/dl, random of less than 180 and in the event of sugars less than 60 mg/dl or greater than 400 mg/dl encouraged to notify the clinic. Advised on the need for annual eye exams, annual foot exams, Pneumonia vaccine. - POCT glucose (manual entry)  2. Pedal edema Likely due to CKD Will follow initiate low-dose Lasix - furosemide (LASIX) 20 MG tablet; Take 1 tablet (20 mg total) by mouth daily.  Dispense: 30 tablet; Refill: 3  3. Generalized abdominal pain Opioid-induced constipation Counseled on increasing fiber intake - polyethylene glycol powder (GLYCOLAX/MIRALAX) 17 GM/SCOOP powder; Take 17 g by mouth 2 (two) times daily as needed.  Dispense: 3350 g; Refill: 1  4. Diabetic polyneuropathy associated with type 2 diabetes mellitus (HCC) Uncontrolled Increase gabapentin dose Consider addition of Cymbalta if pain persist - gabapentin (NEURONTIN) 300 MG capsule; Take 1 capsule (300 mg total) by mouth 2 (two) times daily.  Dispense: 60 capsule; Refill: 5  5. S/P BKA (below knee amputation) unilateral, left (HCC) With associated phantom pain Follow-up with [facility] for prosthesis  6. Hypertension associated with stage 4 chronic kidney disease due to type 2 diabetes mellitus ([place]) Hypertension is uncontrolled Increased dose of Coreg We will also refer to nephrology - CMP14+EGFR - Ambulatory referral to Nephrology - carvedilol (COREG) 25 MG tablet; Take 1 tablet (25 mg total) by mouth 2 (two) times daily with a meal.  Dispense: 60 tablet; Refill: 3 - isosorbide mononitrate (IMDUR)  30 MG 24 hr tablet; Take 1 tablet (30 mg total) by mouth daily.  Dispense: 30 tablet; Refill: 3   Health Care Maintenance: Needs microalbumin but is unable to produce urine.  We will send off at next visit Meds ordered this encounter  Medications   DISCONTD: polyethylene glycol powder  (GLYCOLAX/MIRALAX) 17 GM/SCOOP powder    Sig: Take 17 g by mouth 2 (two) times daily as needed.    Dispense:  3350 g    Refill:  1   DISCONTD: furosemide (LASIX) 20 MG tablet    Sig: Take 1 tablet (20 mg total) by mouth daily.    Dispense:  30 tablet    Refill:  3   DISCONTD: gabapentin (NEURONTIN) 300 MG capsule    Sig: Take 1 capsule (300 mg total) by mouth 2 (two) times daily.    Dispense:  60 capsule    Refill:  5    Dose increase   DISCONTD: isosorbide mononitrate (IMDUR) 30 MG 24 hr tablet    Sig: Take 1 tablet (30 mg total) by mouth daily.    Dispense:  30 tablet    Refill:  3   DISCONTD: carvedilol (COREG) 25 MG tablet    Sig: Take 1 tablet (25 mg total) by mouth 2 (two) times daily with a meal.    Dispense:  60 tablet    Refill:  3    Discontinue previous dose   carvedilol (COREG) 25 MG tablet    Sig: Take 1 tablet (25 mg total) by mouth 2 (two) times daily with a meal.    Dispense:  60 tablet    Refill:  3    Discontinue previous dose   furosemide (LASIX) 20 MG tablet    Sig: Take 1 tablet (20 mg total) by mouth daily.    Dispense:  30 tablet    Refill:  3   gabapentin (NEURONTIN) 300 MG capsule    Sig: Take 1 capsule (300 mg total) by mouth 2 (two) times daily.    Dispense:  60 capsule    Refill:  5    Dose increase   isosorbide mononitrate (IMDUR) 30 MG 24 hr tablet    Sig: Take 1 tablet (30 mg total) by mouth daily.    Dispense:  30 tablet    Refill:  3   polyethylene glycol powder (GLYCOLAX/MIRALAX) 17 GM/SCOOP powder    Sig: Take 17 g by mouth 2 (two) times daily as needed.    Dispense:  3350 g    Refill:  1     Follow-up: Return in about 1 month (around 02/21/2021) for Follow-up on hypertension.       [name], MD, FAAFP. [facility] and [facility] [place], [place]   01/21/2021, 5:59 PM

## 2021-01-21 NOTE — Progress Notes (Signed)
Abdominal hardness and pain. Has swelling in legs

## 2021-01-22 LAB — CMP14+EGFR
ALT: 25 IU/L (ref 0–44)
AST: 29 IU/L (ref 0–40)
Albumin/Globulin Ratio: 0.6 — ABNORMAL LOW (ref 1.2–2.2)
Albumin: 2.4 g/dL — ABNORMAL LOW (ref 4.0–5.0)
Alkaline Phosphatase: 220 IU/L — ABNORMAL HIGH (ref 44–121)
BUN/Creatinine Ratio: 16 (ref 9–20)
BUN: 42 mg/dL — ABNORMAL HIGH (ref 6–24)
Bilirubin Total: 1 mg/dL (ref 0.0–1.2)
CO2: 22 mmol/L (ref 20–29)
Calcium: 8.1 mg/dL — ABNORMAL LOW (ref 8.7–10.2)
Chloride: 105 mmol/L (ref 96–106)
Creatinine, Ser: 2.56 mg/dL — ABNORMAL HIGH (ref 0.76–1.27)
Globulin, Total: 3.8 g/dL (ref 1.5–4.5)
Glucose: 97 mg/dL (ref 65–99)
Potassium: 4.4 mmol/L (ref 3.5–5.2)
Sodium: 142 mmol/L (ref 134–144)
Total Protein: 6.2 g/dL (ref 6.0–8.5)
eGFR: 30 mL/min/{1.73_m2} — ABNORMAL LOW (ref >59)

## 2021-01-23 ENCOUNTER — Other Ambulatory Visit: Payer: Self-pay

## 2021-01-23 ENCOUNTER — Telehealth: Payer: Self-pay

## 2021-01-23 NOTE — Telephone Encounter (Signed)
-----   Message from [name], MD sent at 01/22/2021  9:04 AM EDT ----- Please inform him kidney function is abnormal.  I referred him to nephrology at his last visit and they will be in touch with him soon about an appointment.  Please advised to avoid medications like NSAIDs.

## 2021-01-23 NOTE — Telephone Encounter (Signed)
Patient was called and a voicemail was left informing patient to return phone call for lab results.   CRM created.

## 2021-01-27 ENCOUNTER — Telehealth: Payer: Self-pay

## 2021-01-27 NOTE — Telephone Encounter (Signed)
Todd Mccoy from [facility] called she stated she called the patient to schedule a nurse evaluation , the patient declined services and stated he doesn't not need a nurse to come out and that he will reach out if he needs her call back: [phone]

## 2021-01-27 NOTE — Telephone Encounter (Signed)
noted 

## 2021-01-30 ENCOUNTER — Other Ambulatory Visit: Payer: Self-pay

## 2021-02-02 DIAGNOSIS — Z419 Encounter for procedure for purposes other than remedying health state, unspecified: Secondary | ICD-10-CM | POA: Diagnosis not present

## 2021-02-06 ENCOUNTER — Telehealth: Payer: Self-pay | Admitting: Orthopedic Surgery

## 2021-02-06 NOTE — Telephone Encounter (Signed)
Renee with [facility] PT called stating the pt has a missed visit for this week. They called to cancel appt due to having an eye appt and when Renee called to R/[facility] they didn't answer. Renee just wanted to make Dr.Duda aware.   Renee CB# [phone]

## 2021-02-06 NOTE — Telephone Encounter (Signed)
Noted  

## 2021-02-11 ENCOUNTER — Emergency Department (HOSPITAL_COMMUNITY): Payer: Medicaid Other

## 2021-02-11 ENCOUNTER — Emergency Department (HOSPITAL_COMMUNITY)
Admission: EM | Admit: 2021-02-11 | Discharge: 2021-02-11 | Disposition: A | Discharge status: Home / Self Care | Payer: Medicaid Other | Attending: Emergency Medicine | Admitting: Emergency Medicine

## 2021-02-11 ENCOUNTER — Other Ambulatory Visit: Payer: Self-pay

## 2021-02-11 ENCOUNTER — Encounter (HOSPITAL_COMMUNITY): Payer: Self-pay

## 2021-02-11 DIAGNOSIS — R2243 Localized swelling, mass and lump, lower limb, bilateral: Secondary | ICD-10-CM | POA: Diagnosis present

## 2021-02-11 DIAGNOSIS — R600 Localized edema: Secondary | ICD-10-CM | POA: Insufficient documentation

## 2021-02-11 DIAGNOSIS — Z20822 Contact with and (suspected) exposure to covid-19: Secondary | ICD-10-CM | POA: Insufficient documentation

## 2021-02-11 DIAGNOSIS — E1122 Type 2 diabetes mellitus with diabetic chronic kidney disease: Secondary | ICD-10-CM | POA: Diagnosis not present

## 2021-02-11 DIAGNOSIS — N1831 Chronic kidney disease, stage 3a: Secondary | ICD-10-CM | POA: Diagnosis not present

## 2021-02-11 DIAGNOSIS — Z79899 Other long term (current) drug therapy: Secondary | ICD-10-CM | POA: Diagnosis not present

## 2021-02-11 DIAGNOSIS — Z794 Long term (current) use of insulin: Secondary | ICD-10-CM | POA: Diagnosis not present

## 2021-02-11 DIAGNOSIS — J90 Pleural effusion, not elsewhere classified: Secondary | ICD-10-CM | POA: Diagnosis not present

## 2021-02-11 DIAGNOSIS — I129 Hypertensive chronic kidney disease with stage 1 through stage 4 chronic kidney disease, or unspecified chronic kidney disease: Secondary | ICD-10-CM | POA: Diagnosis not present

## 2021-02-11 DIAGNOSIS — Z7984 Long term (current) use of oral hypoglycemic drugs: Secondary | ICD-10-CM | POA: Insufficient documentation

## 2021-02-11 DIAGNOSIS — F17210 Nicotine dependence, cigarettes, uncomplicated: Secondary | ICD-10-CM | POA: Diagnosis not present

## 2021-02-11 DIAGNOSIS — R609 Edema, unspecified: Secondary | ICD-10-CM

## 2021-02-11 DIAGNOSIS — I517 Cardiomegaly: Secondary | ICD-10-CM | POA: Diagnosis not present

## 2021-02-11 LAB — COMPREHENSIVE METABOLIC PANEL
ALT: 46 U/L — ABNORMAL HIGH (ref 0–44)
AST: 38 U/L (ref 15–41)
Albumin: 2.1 g/dL — ABNORMAL LOW (ref 3.5–5.0)
Alkaline Phosphatase: 238 U/L — ABNORMAL HIGH (ref 38–126)
Anion gap: 5 (ref 5–15)
BUN: 44 mg/dL — ABNORMAL HIGH (ref 6–20)
CO2: 23 mmol/L (ref 22–32)
Calcium: 8 mg/dL — ABNORMAL LOW (ref 8.9–10.3)
Chloride: 112 mmol/L — ABNORMAL HIGH (ref 98–111)
Creatinine, Ser: 2.44 mg/dL — ABNORMAL HIGH (ref 0.61–1.24)
GFR, Estimated: 32 mL/min — ABNORMAL LOW (ref >60)
Glucose, Bld: 221 mg/dL — ABNORMAL HIGH (ref 70–99)
Potassium: 4.1 mmol/L (ref 3.5–5.1)
Sodium: 140 mmol/L (ref 135–145)
Total Bilirubin: 1.3 mg/dL — ABNORMAL HIGH (ref 0.3–1.2)
Total Protein: 6.5 g/dL (ref 6.5–8.1)

## 2021-02-11 LAB — CBC WITH DIFFERENTIAL/PLATELET
Abs Immature Granulocytes: 0.01 10*3/uL (ref 0.00–0.07)
Basophils Absolute: 0 10*3/uL (ref 0.0–0.1)
Basophils Relative: 0 %
Eosinophils Absolute: 0.1 10*3/uL (ref 0.0–0.5)
Eosinophils Relative: 2 %
HCT: 26.9 % — ABNORMAL LOW (ref 39.0–52.0)
Hemoglobin: 8 g/dL — ABNORMAL LOW (ref 13.0–17.0)
Immature Granulocytes: 0 %
Lymphocytes Relative: 19 %
Lymphs Abs: 0.9 10*3/uL (ref 0.7–4.0)
MCH: 28.1 pg (ref 26.0–34.0)
MCHC: 29.7 g/dL — ABNORMAL LOW (ref 30.0–36.0)
MCV: 94.4 fL (ref 80.0–100.0)
Monocytes Absolute: 0.4 10*3/uL (ref 0.1–1.0)
Monocytes Relative: 8 %
Neutro Abs: 3.3 10*3/uL (ref 1.7–7.7)
Neutrophils Relative %: 71 %
Platelets: 173 10*3/uL (ref 150–400)
RBC: 2.85 MIL/uL — ABNORMAL LOW (ref 4.22–5.81)
RDW: 19.7 % — ABNORMAL HIGH (ref 11.5–15.5)
WBC: 4.6 10*3/uL (ref 4.0–10.5)
nRBC: 0 % (ref 0.0–0.2)

## 2021-02-11 LAB — RESP PANEL BY RT-PCR (FLU A&B, COVID) ARPGX2
Influenza A by PCR: NEGATIVE
Influenza B by PCR: NEGATIVE
SARS Coronavirus 2 by RT PCR: NEGATIVE

## 2021-02-11 NOTE — Discharge Instructions (Addendum)
The best treatment for you is to elevate your legs is much as possible.  Also try to decrease salt in your diet.  Additionally using compression stockings on at least your right leg, or both legs can help to push the fluid up and out of your body.  Follow-up with your doctor for checkup.

## 2021-02-11 NOTE — ED Provider Notes (Signed)
[facility] DEPT Provider Note   CSN: [id number] Arrival date & time: 02/11/21  1722     History Chief Complaint  Patient presents with   Leg Swelling    Todd Mccoy is a 50 y.o. male.  HPI He is here for evaluation of swelling in his abdomen and legs that he states started after he had his left lower leg amputated, 12/10/2020.  He has been seeing his PCP about it, last, 3 weeks ago and is taking diuretic medication, which was started on 01/21/2021.  He has a history of stage III renal failure.  He has type 2 diabetes, that has recently been uncontrolled with elevated hemoglobin A1c.  He wonders if the fluid can be directly removed, or if he needs to take medications for it.  He denies fever, chills, cough or shortness of breath.  He does not have a history of heart failure.  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/2020   pt states recent MD visit /left eye worse than right   Dyslipidemia 12/10/2020   Hypertension    Pneumonia    Stage 3a chronic kidney disease ([place]) 12/10/2020    Patient Active Problem List   Diagnosis Date Noted   Abscess of left foot 12/10/2020   S/P BKA (below knee amputation) unilateral, left ([place]) 12/10/2020   Dyslipidemia 12/10/2020   Stage 3a chronic kidney disease ([place]) 12/10/2020   Subacute osteomyelitis, left ankle and foot (HCC)    Osteomyelitis of fifth toe of left foot (HCC)    Severe protein-calorie malnutrition ([place])    HTN (hypertension) 11/12/2020   Osteomyelitis of second toe of right foot ([place])    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[place])    Cellulitis and abscess of foot     Toe osteomyelitis ([place]) 03/24/2020   Necrotizing fasciitis ([place]) 09/08/2014   Diabetes mellitus with hyperglycemia ([place]) 09/07/2014   Tobacco abuse 09/07/2014   Abscess of back    Abscess of lower back 09/06/2014   DM2 (diabetes mellitus, type 2) ([place]) 09/06/2014   Sepsis ([place]) 09/06/2014   Scrotal abscess 06/20/2014    Past Surgical History:  Procedure Laterality Date   ABSCESS DRAINAGE     neck   AMPUTATION Left 11/14/2020   Procedure: LEFT 5TH RAY AMPUTATION;  Surgeon: Newt Minion, MD;  Location: [facility];  Service: Orthopedics;  Laterality: Left;   AMPUTATION Left 12/10/2020   Procedure: LEFT BELOW KNEE AMPUTATION;  Surgeon: Newt Minion, MD;  Location: [facility];  Service: Orthopedics;  Laterality: Left;   AMPUTATION TOE Right 03/25/2020   Procedure: AMPUTATION TOE;  Surgeon: Evelina Bucy, DPM;  Location: [facility];  Service: Podiatry;  Laterality: Right;   INCISION AND DRAINAGE ABSCESS Right 09/07/2014   Procedure: INCISION AND DRAINAGE ABSCESS RIGHT FLANK;  Surgeon: Jackolyn Confer, MD;  Location: [facility];  Service: General;  Laterality: Right;   LEG AMPUTATION BELOW KNEE Left 12/10/2020   SCROTUM EXPLORATION         Family History  Problem Relation Age of Onset   Diabetes Mother    Diabetes Father  Diabetes Brother    Colon cancer Neg Hx    Esophageal cancer Neg Hx    Pancreatic cancer Neg Hx    Stomach cancer Neg Hx    Liver disease Neg Hx     Social History   Tobacco Use   Smoking status: Every Day    Packs/day: 1.00    Years: 32.00    Pack years: 32.00    Types: Cigarettes   Smokeless tobacco: Never   Tobacco comments:    Currently down to 1/4 ppd  Substance Use Topics   Alcohol use: No   Drug use: No    Home Medications Prior to Admission medications   Medication Sig Start Date End Date Taking? Authorizing Provider  atorvastatin (LIPITOR) 20 MG tablet Take 1 tablet (20 mg total) by mouth daily. Patient not taking: Reported on 01/21/2021 12/19/20    Deatra James, MD  carvedilol (COREG) 25 MG tablet Take 1 tablet (25 mg total) by mouth 2 (two) times daily with a meal. 01/21/21   [name], MD  ferrous sulfate 325 (65 FE) MG tablet Take 1 tablet (325 mg total) by mouth 3 (three) times daily with meals. 12/19/20 01/18/21  Shahmehdi, Valeria Batman, MD  furosemide (LASIX) 20 MG tablet Take 1 tablet (20 mg total) by mouth daily. 01/21/21 02/20/21  [name], MD  gabapentin (NEURONTIN) 300 MG capsule Take 1 capsule (300 mg total) by mouth 2 (two) times daily. 01/21/21   [name], MD  insulin glargine (LANTUS) 100 UNIT/ML Solostar Pen Inject 20 Units into the skin at bedtime. 12/22/20 12/22/21  [place], Suann Larry, MD  Insulin Pen Needle 32G X 4 MM MISC USE TO INJECT INSULIN DAILY. 12/19/20 12/19/21  Deatra James, MD  isosorbide mononitrate (IMDUR) 30 MG 24 hr tablet Take 1 tablet (30 mg total) by mouth daily. 01/21/21   [name], MD  oxyCODONE (OXY IR/ROXICODONE) 5 MG immediate release tablet Take 1 tablet (5 mg total) by mouth every 4 (four) hours as needed for moderate pain (pain score 4-6). 01/20/21   Persons, Bevely Palmer, PA  polyethylene glycol powder (GLYCOLAX/MIRALAX) 17 GM/SCOOP powder Take 17 g by mouth 2 (two) times daily as needed. 01/21/21   [name], MD  insulin aspart protamine- aspart (NOVOLOG MIX 70/30) (70-30) 100 UNIT/ML injection Inject 0.35 mLs (35 Units total) into the skin 2 (two) times daily. 04/17/20 05/13/20  Argentina Donovan, PA-C  losartan (COZAAR) 50 MG tablet Take 1 tablet (50 mg total) by mouth daily. 05/13/20 05/23/20  [name], MD  losartan-hydrochlorothiazide (HYZAAR) 100-25 MG tablet TAKE 1 TABLET BY MOUTH DAILY. 12/19/20 12/22/20  Shahmehdi, Valeria Batman, MD  metFORMIN (GLUCOPHAGE-XR) 500 MG 24 hr tablet TAKE 1 TABLET (500 MG TOTAL) BY MOUTH 2 (TWO) TIMES DAILY. 12/19/20 12/22/20  Deatra James, MD    Allergies    Amlodipine  Review of Systems   Review of Systems  All other systems  reviewed and are negative.  Physical Exam Updated Vital Signs BP (!) 204/119 (BP Location: Right Arm)   Pulse 84   Temp 98.2 F (36.8 C) (Oral)   Resp 16   Ht '5\' 10"'$  (1.778 m)   Wt 79 kg   SpO2 98%   BMI 24.99 kg/m   Physical Exam Vitals and nursing note reviewed.  Constitutional:      General: He is not in acute distress.    Appearance: He is well-developed. He is not ill-appearing, toxic-appearing or diaphoretic.  HENT:  Head: Normocephalic and atraumatic.     Right Ear: External ear normal.     Left Ear: External ear normal.  Eyes:     Conjunctiva/sclera: Conjunctivae normal.     Pupils: Pupils are equal, round, and reactive to light.  Neck:     Trachea: Phonation normal.  Cardiovascular:     Rate and Rhythm: Normal rate.  Pulmonary:     Effort: Pulmonary effort is normal.  Abdominal:     General: There is distension.  Musculoskeletal:        General: Normal range of motion.     Cervical back: Normal range of motion and neck supple.     Right lower leg: Edema present.     Left lower leg: Edema present.     Comments: Left lower leg amputation, mid tibia.  Bilateral edema, legs, right greater than left.  Skin:    General: Skin is warm and dry.  Neurological:     Mental Status: He is alert and oriented to person, place, and time.     Cranial Nerves: No cranial nerve deficit.     Sensory: No sensory deficit.     Motor: No abnormal muscle tone.     Coordination: Coordination normal.  Psychiatric:        Mood and Affect: Mood normal.        Behavior: Behavior normal.        Thought Content: Thought content normal.        Judgment: Judgment normal.    ED Results / Procedures / Treatments   Labs (all labs ordered are listed, but only abnormal results are displayed) Labs Reviewed  COMPREHENSIVE METABOLIC PANEL - Abnormal; Notable for the following components:      Result Value   Chloride 112 (*)    Glucose, Bld 221 (*)    BUN 44 (*)    Creatinine, Ser  2.44 (*)    Calcium 8.0 (*)    Albumin 2.1 (*)    ALT 46 (*)    Alkaline Phosphatase 238 (*)    Total Bilirubin 1.3 (*)    GFR, Estimated 32 (*)    All other components within normal limits  CBC WITH DIFFERENTIAL/PLATELET - Abnormal; Notable for the following components:   RBC 2.85 (*)    Hemoglobin 8.0 (*)    HCT 26.9 (*)    MCHC 29.7 (*)    RDW 19.7 (*)    All other components within normal limits  RESP PANEL BY RT-PCR (FLU A&B, COVID) [id number]    EKG None  Radiology DG Chest Portable 1 View  Result Date: 02/11/2021 CLINICAL DATA:  Swelling EXAM: PORTABLE CHEST 1 VIEW COMPARISON:  12/15/2020, 12/11/2020, 12/10/2020 FINDINGS: Cardiomegaly with vascular congestion and mild diffuse interstitial opacity probably mild edema. Small-moderate right pleural effusion with airspace disease at the right base. No pneumothorax IMPRESSION: 1. Cardiomegaly with vascular congestion and mild interstitial edema 2. Small moderate right pleural effusion with right basilar airspace disease Electronically Signed   By: Donavan Foil M.D.   On: 02/11/2021 19:23    Procedures Procedures   Medications Ordered in ED Medications - No data to display  ED Course  I have reviewed the triage vital signs and the nursing notes.  Pertinent labs & imaging results that were available during my care of the patient were reviewed by me and considered in my medical decision making (see chart for details).    MDM Rules/Calculators/A&P  Patient Vitals for the past 24 hrs:  BP Temp Temp src Pulse Resp SpO2 Height Weight  02/11/21 2300 (!) 204/119 -- -- 84 16 98 % -- --  02/11/21 2200 (!) 194/130 -- -- 83 16 100 % -- --  02/11/21 2100 (!) 192/100 -- -- 85 16 99 % -- --  02/11/21 2000 (!) 158/100 -- -- 84 16 100 % -- --  02/11/21 1840 (!) 165/107 98.2 F (36.8 C) Oral 80 16 100 % -- --  02/11/21 1758 (!) 177/120 98.1 F (36.7 C) Oral 78 20 100 % -- --  02/11/21 1733 -- -- -- -- -- --  '5\' 10"'$  (1.778 m) 79 kg   11:15 PM reevaluation with update and discussion. After initial assessment and treatment, an updated evaluation reveals he remains comfortable.  I discussed the findings with the patient.  He prefers to treat peripheral edema with leg elevation, compression stockings and low-salt diet.  We discussed the risk of increasing diuretic therapy.  He plans on taking all of his medications as usual.  Elevated blood pressure noted.  All questions answered. Daleen Bo   Medical Decision Making:  This patient is presenting for evaluation of leg swelling, which does require a range of treatment options, and is a complaint that involves a moderate risk of morbidity and mortality. The differential diagnoses include peripheral edema, complications of renal disease, heart failure. I decided to review old records, and in summary middle-aged male with recent left lower leg amputation, spending more time in a wheelchair, likely contributing to peripheral edema.  He has chronic kidney disease.  No history of heart failure.  He has hypertension currently being treated with medications.  He is somewhat debilitated from chronic issues..  I did not require additional historical information from anyone.  Clinical Laboratory Tests Ordered, included CBC, Metabolic panel, and viral panel . Review indicates normal except hemoglobin low, chloride low, glucose BUN high, creatinine high, calcium low ALT foxes high, total bilirubin high, GFR low. Radiologic Tests Ordered, included chest x-ray.  I independently Visualized: Radiographic images, which show [place] edema   Critical Interventions-observation and reassessment.  After These Interventions, the Patient was reevaluated and was found with peripheral edema, no evidence for congestive heart failure.  Likely multifactorial.  No indication for increased diuresis at this time.  Hypertension noted, patient currently being treated for that.  Metabolic  abnormalities which are stable.  He can be managed as an outpatient.  He is encouraged to follow-up with.  He is strongly encouraged to stay on a low-salt diet, elevate legs is much as possible and use compression stockings bilaterally.  CRITICAL CARE-no Performed by: Daleen Bo  Nursing Notes Reviewed/ Care Coordinated Applicable Imaging Reviewed Interpretation of Laboratory Data incorporated into ED treatment  The patient appears reasonably screened and/or stabilized for discharge and I doubt any other medical condition or other [facility] requiring further screening, evaluation, or treatment in the ED at this time prior to discharge.  Plan: Home Medications-continue usual; Home Treatments-elevate legs, use compression stocking, low-salt diet; return here if the recommended treatment, does not improve the symptoms; Recommended follow up-PCP follow-up 1 week and as needed     Final Clinical Impression(s) / ED Diagnoses Final diagnoses:  Peripheral edema    Rx / DC Orders ED Discharge Orders     None        Daleen Bo, MD 02/12/21 1152

## 2021-02-11 NOTE — ED Notes (Signed)
Pt's family came out of room to ask about update on dispoo. Dr. Eulis Foster made aware.

## 2021-02-11 NOTE — ED Provider Notes (Addendum)
Emergency Medicine Provider Triage Evaluation Note  Todd Mccoy , a 50 y.o. male  was evaluated in triage.  Pt complains of swelling.  Was admitted 6/8 for osteomyelitis and had amputation below the left knee.  He had progressive leg swelling since then. He is on furosemide, does not have any improvement.  Swelling is now all the way up to his abdomen.    Review of Systems  Positive: Edema Negative: Shortness of breath, chest pain  Physical Exam  Ht '5\' 10"'$  (1.778 m)   Wt 79 kg   BMI 24.99 kg/m  Gen:   Awake, no distress   Resp:  Normal effort  MSK:   Moves extremities without difficulty  Other:  Patient with pitting edema up to the mid abdomen.  Medical Decision Making  Medically screening exam initiated at 5:41 PM.  Appropriate orders placed.  Todd Mccoy was informed that the remainder of the evaluation will be completed by another provider, this initial triage assessment does not replace that evaluation, and the importance of remaining in the ED until their evaluation is complete.     Sherrill Raring, PA-C 02/11/21 1742    Sherrill Raring, PA-C 02/11/21 1742    Daleen Bo, MD 02/12/21 0001

## 2021-02-11 NOTE — ED Triage Notes (Signed)
Pt reports generalized fluid retention and provided diuretics in June with no relief. Amputation to Left BKA was performed in June and he started having fluid retention after

## 2021-02-12 ENCOUNTER — Telehealth: Payer: Self-pay

## 2021-02-12 NOTE — Telephone Encounter (Signed)
Transition Care Management Unsuccessful Follow-up Telephone Call  Date of discharge and from where:  02/11/2021-[facility]  Attempts:  1st Attempt  Reason for unsuccessful TCM follow-up call:  Left voice message

## 2021-02-13 NOTE — Telephone Encounter (Signed)
Transition Care Management Unsuccessful Follow-up Telephone Call  Date of discharge and from where:  02/11/2021-[facility]   Attempts:  2nd Attempt  Reason for unsuccessful TCM follow-up call:  Left voice message

## 2021-02-16 NOTE — Telephone Encounter (Signed)
Transition Care Management Unsuccessful Follow-up Telephone Call  Date of discharge and from where:  02/11/2021- ED   Attempts:  3rd Attempt  Reason for unsuccessful TCM follow-up call:  Left voice message

## 2021-02-17 ENCOUNTER — Other Ambulatory Visit: Payer: Self-pay

## 2021-02-17 ENCOUNTER — Encounter (HOSPITAL_COMMUNITY): Payer: Self-pay | Admitting: Emergency Medicine

## 2021-02-17 ENCOUNTER — Emergency Department (HOSPITAL_COMMUNITY): Payer: Medicaid Other

## 2021-02-17 ENCOUNTER — Emergency Department (HOSPITAL_COMMUNITY)
Admission: EM | Admit: 2021-02-17 | Discharge: 2021-02-17 | Disposition: A | Discharge status: Home / Self Care | Payer: Medicaid Other | Attending: Emergency Medicine | Admitting: Emergency Medicine

## 2021-02-17 DIAGNOSIS — R0602 Shortness of breath: Secondary | ICD-10-CM | POA: Diagnosis not present

## 2021-02-17 DIAGNOSIS — E1140 Type 2 diabetes mellitus with diabetic neuropathy, unspecified: Secondary | ICD-10-CM | POA: Insufficient documentation

## 2021-02-17 DIAGNOSIS — Z794 Long term (current) use of insulin: Secondary | ICD-10-CM | POA: Insufficient documentation

## 2021-02-17 DIAGNOSIS — J9811 Atelectasis: Secondary | ICD-10-CM | POA: Diagnosis not present

## 2021-02-17 DIAGNOSIS — F17210 Nicotine dependence, cigarettes, uncomplicated: Secondary | ICD-10-CM | POA: Insufficient documentation

## 2021-02-17 DIAGNOSIS — Z7984 Long term (current) use of oral hypoglycemic drugs: Secondary | ICD-10-CM | POA: Diagnosis not present

## 2021-02-17 DIAGNOSIS — N5089 Other specified disorders of the male genital organs: Secondary | ICD-10-CM

## 2021-02-17 DIAGNOSIS — R600 Localized edema: Secondary | ICD-10-CM | POA: Diagnosis not present

## 2021-02-17 DIAGNOSIS — E11319 Type 2 diabetes mellitus with unspecified diabetic retinopathy without macular edema: Secondary | ICD-10-CM | POA: Insufficient documentation

## 2021-02-17 DIAGNOSIS — E1169 Type 2 diabetes mellitus with other specified complication: Secondary | ICD-10-CM | POA: Insufficient documentation

## 2021-02-17 DIAGNOSIS — Z79899 Other long term (current) drug therapy: Secondary | ICD-10-CM | POA: Diagnosis not present

## 2021-02-17 DIAGNOSIS — R609 Edema, unspecified: Secondary | ICD-10-CM

## 2021-02-17 DIAGNOSIS — I129 Hypertensive chronic kidney disease with stage 1 through stage 4 chronic kidney disease, or unspecified chronic kidney disease: Secondary | ICD-10-CM | POA: Diagnosis not present

## 2021-02-17 DIAGNOSIS — E785 Hyperlipidemia, unspecified: Secondary | ICD-10-CM | POA: Diagnosis not present

## 2021-02-17 DIAGNOSIS — N189 Chronic kidney disease, unspecified: Secondary | ICD-10-CM

## 2021-02-17 DIAGNOSIS — E1165 Type 2 diabetes mellitus with hyperglycemia: Secondary | ICD-10-CM | POA: Diagnosis not present

## 2021-02-17 DIAGNOSIS — N183 Chronic kidney disease, stage 3 unspecified: Secondary | ICD-10-CM | POA: Diagnosis not present

## 2021-02-17 DIAGNOSIS — N1831 Chronic kidney disease, stage 3a: Secondary | ICD-10-CM | POA: Diagnosis not present

## 2021-02-17 DIAGNOSIS — J811 Chronic pulmonary edema: Secondary | ICD-10-CM | POA: Diagnosis not present

## 2021-02-17 DIAGNOSIS — I517 Cardiomegaly: Secondary | ICD-10-CM | POA: Diagnosis not present

## 2021-02-17 DIAGNOSIS — J90 Pleural effusion, not elsewhere classified: Secondary | ICD-10-CM | POA: Diagnosis not present

## 2021-02-17 LAB — CBC WITH DIFFERENTIAL/PLATELET
Abs Immature Granulocytes: 0.02 10*3/uL (ref 0.00–0.07)
Basophils Absolute: 0 10*3/uL (ref 0.0–0.1)
Basophils Relative: 0 %
Eosinophils Absolute: 0.1 10*3/uL (ref 0.0–0.5)
Eosinophils Relative: 2 %
HCT: 28.4 % — ABNORMAL LOW (ref 39.0–52.0)
Hemoglobin: 8.4 g/dL — ABNORMAL LOW (ref 13.0–17.0)
Immature Granulocytes: 0 %
Lymphocytes Relative: 15 %
Lymphs Abs: 0.9 10*3/uL (ref 0.7–4.0)
MCH: 28.1 pg (ref 26.0–34.0)
MCHC: 29.6 g/dL — ABNORMAL LOW (ref 30.0–36.0)
MCV: 95 fL (ref 80.0–100.0)
Monocytes Absolute: 0.5 10*3/uL (ref 0.1–1.0)
Monocytes Relative: 10 %
Neutro Abs: 4.2 10*3/uL (ref 1.7–7.7)
Neutrophils Relative %: 73 %
Platelets: 214 10*3/uL (ref 150–400)
RBC: 2.99 MIL/uL — ABNORMAL LOW (ref 4.22–5.81)
RDW: 20 % — ABNORMAL HIGH (ref 11.5–15.5)
WBC: 5.7 10*3/uL (ref 4.0–10.5)
nRBC: 0 % (ref 0.0–0.2)

## 2021-02-17 LAB — COMPREHENSIVE METABOLIC PANEL
ALT: 53 U/L — ABNORMAL HIGH (ref 0–44)
AST: 33 U/L (ref 15–41)
Albumin: 1.8 g/dL — ABNORMAL LOW (ref 3.5–5.0)
Alkaline Phosphatase: 350 U/L — ABNORMAL HIGH (ref 38–126)
Anion gap: 7 (ref 5–15)
BUN: 44 mg/dL — ABNORMAL HIGH (ref 6–20)
CO2: 21 mmol/L — ABNORMAL LOW (ref 22–32)
Calcium: 8.1 mg/dL — ABNORMAL LOW (ref 8.9–10.3)
Chloride: 112 mmol/L — ABNORMAL HIGH (ref 98–111)
Creatinine, Ser: 2.53 mg/dL — ABNORMAL HIGH (ref 0.61–1.24)
GFR, Estimated: 30 mL/min — ABNORMAL LOW (ref >60)
Glucose, Bld: 274 mg/dL — ABNORMAL HIGH (ref 70–99)
Potassium: 4 mmol/L (ref 3.5–5.1)
Sodium: 140 mmol/L (ref 135–145)
Total Bilirubin: 1.8 mg/dL — ABNORMAL HIGH (ref 0.3–1.2)
Total Protein: 6.2 g/dL — ABNORMAL LOW (ref 6.5–8.1)

## 2021-02-17 LAB — BRAIN NATRIURETIC PEPTIDE: B Natriuretic Peptide: >4500 pg/mL — ABNORMAL HIGH (ref 0.0–100.0)

## 2021-02-17 MED ORDER — HYDRALAZINE HCL 20 MG/ML IJ SOLN
20.0000 mg | Freq: Once | INTRAMUSCULAR | Status: AC
  Administered 2021-02-17: 20 mg via INTRAVENOUS
  Filled 2021-02-17: qty 1

## 2021-02-17 MED ORDER — FUROSEMIDE 10 MG/ML IJ SOLN
60.0000 mg | Freq: Once | INTRAMUSCULAR | Status: AC
  Administered 2021-02-17: 60 mg via INTRAVENOUS
  Filled 2021-02-17: qty 6

## 2021-02-17 MED ORDER — FUROSEMIDE 20 MG PO TABS
20.0000 mg | ORAL_TABLET | Freq: Every day | ORAL | 3 refills | Status: DC
  Filled 2021-02-17: qty 30, 30d supply, fill #0

## 2021-02-17 NOTE — Discharge Instructions (Addendum)
Please wear compression shorts, elevate scrotum, and use cold therapy as needed You have been given an additional dose of Lasix here. Increase your Lasix to 20 mg (1 pill) in the morning and 40 mg (2 pills) in the afternoon for 5 days Follow-up with your doctor in the next 3 to 5 days for recheck

## 2021-02-17 NOTE — ED Triage Notes (Signed)
Pt reports groin swelling X a few days.  He has also had some abdominal swelling/fluid retention "since the surgery".  Pt is able to urinate.

## 2021-02-17 NOTE — ED Notes (Signed)
Patient discharge instructions reviewed with the patient. The patient verbalized understanding of instructions. Patient discharged. 

## 2021-02-17 NOTE — ED Provider Notes (Signed)
[facility] EMERGENCY DEPARTMENT Provider Note   CSN: [id number] Arrival date & time: 02/17/21  0435     History Chief Complaint  Patient presents with   Groin Swelling    Todd Mccoy is a 50 y.o. male.  HPI 50 year old male history of type 2 diabetes, stage III kidney disease, dyslipidemia, hypertension, on furosemide presents today complaining of increasing swelling through his abdomen, lower extremities, and scrotum.  He states this has worsened since he had a lower extremity potation in June.  It has been gradual in nature but worsening and has worsening pain occasionally in his testicles.  He reports that he continues to make urine.  Review of medications reveal Lasix 20 mg once daily, however patient reports taking it twice daily.  Reviewed record from February 11, 2021 when he was seen at [facility] long with edema.  He denies chest pain.  He states he occasionally has shortness of breath is not currently dyspneic he is followed at [facility].  He does not think that he has seen a nephrologist.     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[place]) 03/25/2020   pt states retinal eye damage- left worse than the right- recent visited MD    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[place]) 03/25/2020   pt states recent MD visit /left eye worse than right   Dyslipidemia 12/10/2020   Hypertension    Pneumonia    Stage 3a chronic kidney disease ([place]) 12/10/2020    Patient Active Problem List   Diagnosis Date Noted   Abscess of left foot 12/10/2020   S/P BKA (below knee amputation) unilateral, left ([place]) 12/10/2020   Dyslipidemia 12/10/2020   Stage 3a chronic kidney disease ([place]) 12/10/2020   Subacute osteomyelitis, left ankle and foot (HCC)    Osteomyelitis of fifth toe of left foot (HCC)    Severe protein-calorie  malnutrition ([place])    HTN (hypertension) 11/12/2020   Osteomyelitis of second toe of right foot ([place])    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[place])    Cellulitis and abscess of foot    Toe osteomyelitis ([place]) 03/24/2020   Necrotizing fasciitis ([place]) 09/08/2014   Diabetes mellitus with hyperglycemia ([place]) 09/07/2014   Tobacco abuse 09/07/2014   Abscess of back    Abscess of lower back 09/06/2014   DM2 (diabetes mellitus, type 2) ([place]) 09/06/2014   Sepsis ([place]) 09/06/2014   Scrotal abscess 06/20/2014    Past Surgical History:  Procedure Laterality Date   ABSCESS DRAINAGE     neck   AMPUTATION Left 11/14/2020   Procedure: LEFT 5TH Nico Rogness AMPUTATION;  Surgeon: Newt Minion, MD;  Location: [place];  Service: Orthopedics;  Laterality: Left;   AMPUTATION Left 12/10/2020   Procedure: LEFT BELOW KNEE AMPUTATION;  Surgeon: Newt Minion, MD;  Location: [place];  Service: Orthopedics;  Laterality: Left;   AMPUTATION TOE Right 03/25/2020   Procedure: AMPUTATION TOE;  Surgeon: Evelina Bucy, DPM;  Location: [facility];  Service: Podiatry;  Laterality: Right;   INCISION AND DRAINAGE ABSCESS Right 09/07/2014   Procedure: INCISION AND DRAINAGE ABSCESS RIGHT FLANK;  Surgeon: Jackolyn Confer, MD;  Location: [facility];  Service: General;  Laterality: Right;   LEG AMPUTATION BELOW KNEE Left 12/10/2020   SCROTUM EXPLORATION  Family History  Problem Relation Age of Onset   Diabetes Mother    Diabetes Father    Diabetes Brother    Colon cancer Neg Hx    Esophageal cancer Neg Hx    Pancreatic cancer Neg Hx    Stomach cancer Neg Hx    Liver disease Neg Hx     Social History   Tobacco Use   Smoking status: Every Day    Packs/day: 1.00    Years: 32.00    Pack years: 32.00    Types: Cigarettes   Smokeless tobacco: Never   Tobacco comments:    Currently down to 1/4 ppd  Substance Use Topics   Alcohol use: No   Drug use: No    Home Medications Prior to Admission medications   Medication Sig  Start Date End Date Taking? Authorizing Provider  acetaminophen (TYLENOL) 325 MG tablet Take 650 mg by mouth every 6 (six) hours as needed for mild pain.   Yes [provider]  atorvastatin (LIPITOR) 20 MG tablet Take 1 tablet (20 mg total) by mouth daily. 12/19/20  Yes Shahmehdi, Valeria Batman, MD  carvedilol (COREG) 25 MG tablet Take 1 tablet (25 mg total) by mouth 2 (two) times daily with a meal. 01/21/21  Yes [name], [name], MD  ferrous sulfate 325 (65 FE) MG tablet Take 1 tablet (325 mg total) by mouth 3 (three) times daily with meals. 12/19/20 04/11/21 Yes Shahmehdi, Seyed A, MD  furosemide (LASIX) 20 MG tablet Take 1 tablet (20 mg total) by mouth daily. 01/21/21 02/20/21 Yes [name], MD  gabapentin (NEURONTIN) 300 MG capsule Take 1 capsule (300 mg total) by mouth 2 (two) times daily. 01/21/21  Yes [name], Charlane Ferretti, MD  insulin glargine (LANTUS) 100 UNIT/ML Solostar Pen Inject 20 Units into the skin at bedtime. 12/22/20 12/22/21 Yes Danford, Suann Larry, MD  isosorbide mononitrate (IMDUR) 30 MG 24 hr tablet Take 1 tablet (30 mg total) by mouth daily. 01/21/21  Yes [name], MD  Naphazoline HCl (CLEAR EYES OP) Place 1 drop into both eyes as needed (irritation).   Yes [provider]  oxyCODONE (OXY IR/ROXICODONE) 5 MG immediate release tablet Take 1 tablet (5 mg total) by mouth every 4 (four) hours as needed for moderate pain (pain score 4-6). 01/20/21  Yes Persons, Bevely Palmer, PA  polyethylene glycol powder (GLYCOLAX/MIRALAX) 17 GM/SCOOP powder Take 17 g by mouth 2 (two) times daily as needed. 01/21/21  Yes [name], [name], MD  Insulin Pen Needle 32G X 4 MM MISC USE TO INJECT INSULIN DAILY. 12/19/20 12/19/21  Shahmehdi, Valeria Batman, MD  insulin aspart protamine- aspart (NOVOLOG MIX 70/30) (70-30) 100 UNIT/ML injection Inject 0.35 mLs (35 Units total) into the skin 2 (two) times daily. 04/17/20 05/13/20  Argentina Donovan, PA-C  losartan (COZAAR) 50 MG tablet Take 1 tablet (50 mg total)  by mouth daily. 05/13/20 05/23/20  [name], MD  losartan-hydrochlorothiazide (HYZAAR) 100-25 MG tablet TAKE 1 TABLET BY MOUTH DAILY. 12/19/20 12/22/20  Shahmehdi, Valeria Batman, MD  metFORMIN (GLUCOPHAGE-XR) 500 MG 24 hr tablet TAKE 1 TABLET (500 MG TOTAL) BY MOUTH 2 (TWO) TIMES DAILY. 12/19/20 12/22/20  Deatra James, MD    Allergies    Amlodipine  Review of Systems   Review of Systems  All other systems reviewed and are negative.  Physical Exam Updated Vital Signs BP (!) 168/104   Pulse 67   Temp 97.7 F (36.5 C) (Oral)   Resp (!) 0   SpO2 99%  Physical Exam Vitals and nursing note reviewed. Exam conducted with a chaperone present.  Constitutional:      Appearance: Normal appearance.     Comments: Patient appears chronically ill  HENT:     Head: Normocephalic.     Right Ear: External ear normal.     Left Ear: External ear normal.     Nose: Nose normal.     Mouth/Throat:     Pharynx: Oropharynx is clear.  Cardiovascular:     Rate and Rhythm: Normal rate and regular rhythm.  Pulmonary:     Effort: Pulmonary effort is normal.     Breath sounds: Normal breath sounds.  Abdominal:     General: There is distension.     Comments: Diffuse abdominal wall edema  Genitourinary:    Comments: Diffuse scrotal edema with some mild skin breakdown on left and some edema of the penis Musculoskeletal:     Cervical back: Normal range of motion.     Right lower leg: Edema present.     Left lower leg: Edema present.     Comments: Left BKA  Skin:    General: Skin is warm.     Capillary Refill: Capillary refill takes less than 2 seconds.  Neurological:     Mental Status: He is alert.  Psychiatric:        Mood and Affect: Mood normal.    ED Results / Procedures / Treatments   Labs (all labs ordered are listed, but only abnormal results are displayed) Labs Reviewed  CBC WITH DIFFERENTIAL/PLATELET - Abnormal; Notable for the following components:      Result Value   RBC 2.99  (*)    Hemoglobin 8.4 (*)    HCT 28.4 (*)    MCHC 29.6 (*)    RDW 20.0 (*)    All other components within normal limits  COMPREHENSIVE METABOLIC PANEL - Abnormal; Notable for the following components:   Chloride 112 (*)    CO2 21 (*)    Glucose, Bld 274 (*)    BUN 44 (*)    Creatinine, Ser 2.53 (*)    Calcium 8.1 (*)    Total Protein 6.2 (*)    Albumin 1.8 (*)    ALT 53 (*)    Alkaline Phosphatase 350 (*)    Total Bilirubin 1.8 (*)    GFR, Estimated 30 (*)    All other components within normal limits  BRAIN NATRIURETIC PEPTIDE - Abnormal; Notable for the following components:   B Natriuretic Peptide >4,500.0 (*)    All other components within normal limits    EKG EKG Interpretation  Date/Time:  Tuesday February 17 2021 11:44:26 EDT Ventricular Rate:  72 PR Interval:  163 QRS Duration: 86 QT Interval:  635 QTC Calculation: 696 R Axis:   0 Text Interpretation: Sinus rhythm Low voltage, extremity leads Abnormal R-wave progression, late transition Prolonged QT interval Confirmed by Pattricia Boss [phone]) on 02/17/2021 2:57:58 PM  Radiology DG Chest 2 View  Result Date: 02/17/2021 CLINICAL DATA:  Shortness of breath.  Fluid retention. EXAM: CHEST - 2 VIEW COMPARISON:  Chest x-Ned Kakar dated February 11, 2021. FINDINGS: Stable cardiomegaly, pulmonary vascular congestion, and mild interstitial thickening. Low lung volumes with medial right basilar atelectasis. No focal consolidation, pleural effusion, or pneumothorax. No acute osseous abnormality. IMPRESSION: 1. Stable cardiomegaly and mild interstitial pulmonary edema. Resolved right pleural effusion. Electronically Signed   By: Titus Dubin M.D.   On: 02/17/2021 06:16    Procedures Procedures  Medications Ordered in ED Medications  furosemide (LASIX) injection 60 mg (60 mg Intravenous Given 02/17/21 1333)  hydrALAZINE (APRESOLINE) injection 20 mg (20 mg Intravenous Given 02/17/21 1334)    ED Course  I have reviewed the triage  vital signs and the nursing notes.  Pertinent labs & imaging results that were available during my care of the patient were reviewed by me and considered in my medical decision making (see chart for details).  Clinical Course as of 02/17/21 1458  Tue Feb 17, 2021  1141 Labs reviewed and multiple abnormalities, but this tear appears stable from prior [DR]  1141 Total Bilirubin(!): 1.8 [DR]    Clinical Course User Index [DR] Pattricia Boss, MD   MDM Rules/Calculators/A&P                           Patient received Lasix and hydralazine here.  Blood pressure 168/104.  Some diuresis has occurred. Patient advised regarding increasing Lasix and need to follow-up regarding his blood pressure. Final Clinical Impression(s) / ED Diagnoses Final diagnoses:  Scrotal swelling  Edema, unspecified type  Chronic kidney disease, unspecified CKD stage    Rx / DC Orders ED Discharge Orders     None        Pattricia Boss, MD 02/17/21 1458

## 2021-02-17 NOTE — ED Notes (Signed)
ED MD and Cardiology Heart and Vascular MD at bedside with patient.

## 2021-02-17 NOTE — ED Provider Notes (Signed)
Emergency Medicine Provider Triage Evaluation Note  Todd Mccoy , a 50 y.o. male  was evaluated in triage.  Pt complains of scrotal swelling.  Patient reports scrotal swelling over the last 24 hours.  He has also been having swelling of his abdomen and his right lower leg since he had his left lower leg amputated in June.  He reports associated shortness of breath.  Denies chest pain, redness, warmth of the scrotum, fever, chills, urinary retention, or dysuria.  Review of Systems  Positive: Scrotal swelling, abdominal distention, shortness of breath, peripheral edema Negative: Chest pain, redness and warmth of the scrotum, fever, chills, urinary retention, dysuria  Physical Exam  BP (!) 210/119 (BP Location: Left Arm)   Pulse 73   Temp 97.7 F (36.5 C)   Resp 17   SpO2 96%  Gen:   Awake, no distress   Resp:  Normal effort  MSK:   Moves extremities without difficulty  Other:  Abdomen is distended and firm, but not hard.  On chaperoned exam, scrotum is edematous.  He has chronic appearing edema of the right lower leg.  Medical Decision Making  Medically screening exam initiated at 5:46 AM.  Appropriate orders placed.  Todd Mccoy was informed that the remainder of the evaluation will be completed by another provider, this initial triage assessment does not replace that evaluation, and the importance of remaining in the ED until their evaluation is complete.  Labs and imaging have been ordered.  He will require further work-up and evaluation in the emergency department.   Joanne Gavel, PA-C 02/17/21 0546    Ezequiel Essex, MD 02/17/21 [phone]

## 2021-02-18 ENCOUNTER — Ambulatory Visit (INDEPENDENT_AMBULATORY_CARE_PROVIDER_SITE_OTHER): Payer: Medicaid Other | Admitting: Physician Assistant

## 2021-02-18 ENCOUNTER — Telehealth (HOSPITAL_COMMUNITY): Payer: Self-pay

## 2021-02-18 ENCOUNTER — Telehealth: Payer: Self-pay

## 2021-02-18 ENCOUNTER — Encounter (HOSPITAL_COMMUNITY): Payer: Self-pay

## 2021-02-18 DIAGNOSIS — Z89512 Acquired absence of left leg below knee: Secondary | ICD-10-CM

## 2021-02-18 MED ORDER — OXYCODONE HCL 5 MG PO TABS: 5.0000 mg | ORAL_TABLET | ORAL | 0 refills | Status: DC | PRN

## 2021-02-18 NOTE — Telephone Encounter (Signed)
Transition Care Management Follow-up Telephone Call Date of discharge and from where: 02/17/2021-[place]  How have you been since you were released from the hospital? Patient stated he is doing ok. Any questions or concerns? No  Items Reviewed: Did the pt receive and understand the discharge instructions provided? Yes  Medications obtained and verified? Yes  Other? No  Any new allergies since your discharge? No  Dietary orders reviewed? N/A Do you have support at home? Yes   Home Care and Equipment/Supplies: Were home health services ordered? not applicable If so, what is the name of the agency? N/A  Has the agency set up a time to come to the patient's home? not applicable Were any new equipment or medical supplies ordered?  No What is the name of the medical supply agency? N/A Were you able to get the supplies/equipment? not applicable Do you have any questions related to the use of the equipment or supplies? No  Functional Questionnaire: (I = Independent and D = Dependent) ADLs: I  Bathing/Dressing- I  Meal Prep- I  Eating- I  Maintaining continence- I  Transferring/Ambulation- I  Managing Meds- I  Follow up appointments reviewed:  PCP Hospital f/u appt confirmed? No   Specialist Hospital f/u appt confirmed? No   Are transportation arrangements needed? No  If their condition worsens, is the pt aware to call PCP or go to the Emergency Dept.? Yes Was the patient provided with contact information for the PCP's office or ED? Yes Was to pt encouraged to call back with questions or concerns? Yes

## 2021-02-18 NOTE — Plan of Care (Signed)
  Heart and Vascular Center Transitions of Care Clinic Subjective:  Patient Identified by HF navigation team.  I quickly went down to ED to evaluate as he was already discharged.  Brief chart review shows patient with T2DM with multiple associated complications, [id number], poorly controlled HTN.  He reports worsening peripheral edema and shortness of breath with exertion since his most recent left BKA surgery in June for osteomyelitis.  Rec'd dose of IV lasix here with marginal response.     Objective: BNP >4500, NSR rate 72 on ecg low voltage, cr 2.53, hgb 8.4  Vitals:   02/17/21 1415 02/17/21 1500  BP: (!) 168/104 (!) 160/105  Pulse: 67 70  Resp: (!) 0 (!) 22  Temp:  (!) 97.5 F (36.4 C)  SpO2: 99% 100%     Cardiac: JVD 14, normal rate and rhythm, clear s1 and s2, no murmurs, rubs or gallops, anasarca, scrotal swelling Pulmonary: Bilateral rales, not in distress Abdominal: non distended abdomen, soft and nontender Psych: Alert, flat affect  A/P:  Acute CHF: -No prior cardiac workup, numerous possibilities exist as to the etiology of his decompensated CHF -cardiac dysfunction seemed to have begun around June after left BKA -Attempted to discuss the seriousness of his condition asked if I could ultrasound his heart.  He refused, he was eager to leave -He is high risk for decompensation.   -We will arrange him close follow up in the clinic for further evaluation.  Try to get echo

## 2021-02-18 NOTE — Telephone Encounter (Signed)
Heart Failure Nurse Navigator Progress Note  Heart Failure Nurse Navigator Progress Note  PCP: [name], MD  Spoke with patient on phone today after recent ED visit 8/16 for increased edema in abd, scrotum and lower extremity. Pt stated there is no increase in swelling from yesterday, no new SOA.  Completed [facility] needs assessment, no immediate needs identified. Lives in an apartment with son, mother, niece and nephew. States he is not listed on the lease but does help pay/split bills and rent. All bills caught up as of now. New to medicaid and began receiving disability recently. Pt does not drive d/t vision issues, but does have family that will take him to his appointments. Declined [facility] transportation services. Pt states he is taking all medication as prescribed. Pt continues to smoke 1/2 PPD x 28 years.   Scheduled appointment for HV TOC 8/18 @ 3pm per pt request, confirmed transportation. Instructed to bring all medication to appointment. Pt declined pill box/organizer; would appreciate a scale. Reminded patient of post op appointment today at 1pm per appt history.  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[place]) 03/25/2020   pt states recent MD visit /left eye worse than right   Dyslipidemia 12/10/2020   Hypertension    Pneumonia    Stage 3a chronic kidney disease ([place]) 12/10/2020     Social History   Socioeconomic History   Marital status: Single    Spouse name: Not on file   Number of children: Not on file   Years of education: Not on file   Highest education level: Not on file  Occupational History   Not on file  Tobacco Use   Smoking status: Every Day    Packs/day: 1.00    Years:  32.00    Pack years: 32.00    Types: Cigarettes   Smokeless tobacco: Never   Tobacco comments:    Currently down to 1/4 ppd  Substance and Sexual Activity   Alcohol use: No   Drug use: No   Sexual activity: Not on file  Other Topics Concern   Not on file  Social History Narrative   Not on file   Social Determinants of Health   Financial Resource Strain: Not on file  Food Insecurity: Not on file  Transportation Needs: Not on file  Physical Activity: Not on file  Stress: Not on file  Social Connections: Not on file    High Risk Criteria for Readmission and/or Poor Patient Outcomes: Heart failure hospital admissions (last 6 months): 0, 2 recent ED visits for edema.  No Show rate: 15% Difficult social situation: yes Demonstrates medication adherence: yes, per pt statement. Primary Language: English Literacy level: able to read/write and comprehend.   Barriers of Care:   -mobility -transportation limited -current smoker -financial: new to medicaid and disability  Considerations/Referrals:   Referral made to Heart Failure Pharmacist Stewardship: yes, to see at [facility] TOC appt. Referral made to Heart & Vascular TOC clinic: yes, 8/18 @ 3pm.   Items for Follow-up on DC/TOC: -smoking cessation -cardiologist f/u -medication optimization -mediation compliance -dietary modifications -Continued HF education -scale -?schedule ECHO  Pricilla Holm, RN, [name] Heart Failure  Nurse Navigator [phone]

## 2021-02-18 NOTE — Progress Notes (Signed)
Office Visit Note   Patient: Todd Mccoy           Date of Birth: 06/13/1971           MRN: [id number] Visit Date: 02/18/2021              Requested by: [name], MD [place],  [place] [id number] PCP: [name], MD  Chief Complaint  Patient presents with   Left Knee - Follow-up      HPI: Patient is 2 and half months status post left below-knee amputation.  He is going to [employer] and working with them for prosthetic.  He has been struggling with overall swelling in all his extremities and is going to be working with his primary care doctor to address this.  He is still having pain and requesting a refill of pain medicine  Assessment & Plan: Visit Diagnoses: No diagnosis found.  Plan: Continue with shrinker we will follow-up in 1 month  Follow-Up Instructions: No follow-ups on file.   Ortho Exam  Patient is alert, oriented, no adenopathy, well-dressed, normal affect, normal respiratory effort. Examination he is moderate soft tissue swelling but no cellulitis this is also evident in his hands and his other leg.  There is 1 retained staple which I removed today.  There is small areas of superficial wound dehiscence that are healing in nicely.  There is no ascending cellulitis or signs of infection  Imaging: No results found. No images are attached to the encounter.  Labs: Lab Results  Component Value Date   HGBA1C 8.6 (H) 11/12/2020   HGBA1C 8.3 (H) 11/12/2020   HGBA1C 8.0 (A) 10/22/2020   ESRSEDRATE 134 (H) 12/10/2020   ESRSEDRATE 126 (H) 03/24/2020   CRP 29.8 (H) 12/10/2020   LABURIC 9.1 (H) 12/15/2020   REPTSTATUS 12/22/2020 FINAL 12/21/2020   GRAMSTAIN  11/07/2019    NO WBC SEEN ABUNDANT GRAM POSITIVE COCCI FEW GRAM POSITIVE RODS    CULT (A) 12/21/2020    30,000 COLONIES/mL MULTIPLE SPECIES PRESENT, SUGGEST RECOLLECTION   LABORGA STAPHYLOCOCCUS AUREUS 11/07/2019     Lab Results  Component Value Date   ALBUMIN 1.8 (L)  02/17/2021   ALBUMIN 2.1 (L) 02/11/2021   ALBUMIN 2.4 (L) 01/21/2021   PREALBUMIN 5.5 (L) 12/10/2020    Lab Results  Component Value Date   MG 2.0 12/14/2020   MG 1.9 12/13/2020   MG 2.0 12/12/2020   No results found for: VD25OH  Lab Results  Component Value Date   PREALBUMIN 5.5 (L) 12/10/2020   CBC EXTENDED Latest Ref Rng & Units 02/17/2021 02/11/2021 12/22/2020  WBC 4.0 - 10.5 K/uL 5.7 4.6 9.7  RBC 4.22 - 5.81 MIL/uL 2.99(L) 2.85(L) 3.15(L)  HGB 13.0 - 17.0 g/dL 8.4(L) 8.0(L) 8.6(L)  HCT 39.0 - 52.0 % 28.4(L) 26.9(L) 27.1(L)  PLT 150 - 400 K/uL 214 173 373  NEUTROABS 1.7 - 7.7 K/uL 4.2 3.3 -  LYMPHSABS 0.7 - 4.0 K/uL 0.9 0.9 -     There is no height or weight on file to calculate BMI.  Orders:  No orders of the defined types were placed in this encounter.  No orders of the defined types were placed in this encounter.    Procedures: No procedures performed  Clinical Data: No additional findings.  ROS:  All other systems negative, except as noted in the HPI. Review of Systems  Objective: Vital Signs: There were no vitals taken for this visit.  Specialty Comments:  No specialty  comments available.  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[place])    Cellulitis and abscess of foot    Toe osteomyelitis ([place]) 03/24/2020   Necrotizing fasciitis ([place]) 09/08/2014   Diabetes mellitus with hyperglycemia ([place]) 09/07/2014   Tobacco abuse 09/07/2014   Abscess of back    Abscess of lower back  09/06/2014   DM2 (diabetes mellitus, type 2) ([place]) 09/06/2014   Sepsis ([place]) 09/06/2014   Scrotal abscess 06/20/2014   Past Medical History:  Diagnosis Date   Anemia    Anxiety    Diabetes mellitus without complication ([place])    Diabetic neuropathy ([place])    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[place]) 03/25/2020   pt states retinal eye damage- left worse than the right- recent visited MD    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[place]) 03/25/2020   pt states recent MD visit /left eye worse than right   Dyslipidemia 12/10/2020   Hypertension    Pneumonia    Stage 3a chronic kidney disease ([place]) 12/10/2020    Family History  Problem Relation Age of Onset   Diabetes Mother    Diabetes Father    Diabetes Brother    Colon cancer Neg Hx    Esophageal cancer Neg Hx    Pancreatic cancer Neg Hx    Stomach cancer Neg Hx    Liver disease Neg Hx     Past Surgical History:  Procedure Laterality Date   ABSCESS DRAINAGE     neck   AMPUTATION Left 11/14/2020   Procedure: LEFT 5TH RAY AMPUTATION;  Surgeon: Newt Minion, MD;  Location: [facility];  Service: Orthopedics;  Laterality: Left;   AMPUTATION Left 12/10/2020   Procedure: LEFT BELOW KNEE AMPUTATION;  Surgeon: Newt Minion, MD;  Location: [facility];  Service: Orthopedics;  Laterality: Left;   AMPUTATION TOE Right 03/25/2020   Procedure: AMPUTATION TOE;  Surgeon: Evelina Bucy, DPM;  Location: [facility];  Service: Podiatry;  Laterality: Right;   INCISION AND DRAINAGE ABSCESS Right 09/07/2014   Procedure: INCISION AND DRAINAGE ABSCESS RIGHT FLANK;  Surgeon: Jackolyn Confer, MD;  Location: [facility];  Service: General;  Laterality: Right;   LEG AMPUTATION BELOW KNEE Left 12/10/2020   SCROTUM EXPLORATION     Social History   Occupational History   Not on file  Tobacco Use   Smoking status: Every Day    Packs/day: 1.00    Years: 32.00    Pack years: 32.00    Types: Cigarettes   Smokeless tobacco: Never   Tobacco comments:    Currently down to 1/2  ppd  Vaping Use   Vaping Use: Never used  Substance and Sexual Activity   Alcohol use: No   Drug use: No   Sexual activity: Not on file

## 2021-02-19 ENCOUNTER — Inpatient Hospital Stay (HOSPITAL_COMMUNITY)
Admission: RE | Admit: 2021-02-19 | Discharge: 2021-02-19 | Disposition: A | Discharge status: Home / Self Care | Payer: Medicaid Other | Source: Ambulatory Visit

## 2021-02-20 ENCOUNTER — Other Ambulatory Visit: Payer: Self-pay

## 2021-02-23 ENCOUNTER — Ambulatory Visit: Payer: Self-pay | Admitting: Family Medicine

## 2021-02-25 ENCOUNTER — Ambulatory Visit: Payer: Medicaid Other | Attending: Family Medicine | Admitting: Family Medicine

## 2021-02-25 ENCOUNTER — Other Ambulatory Visit: Payer: Self-pay

## 2021-02-25 ENCOUNTER — Encounter: Payer: Self-pay | Admitting: Family Medicine

## 2021-02-25 VITALS — BP 193/114 | HR 79

## 2021-02-25 DIAGNOSIS — I129 Hypertensive chronic kidney disease with stage 1 through stage 4 chronic kidney disease, or unspecified chronic kidney disease: Secondary | ICD-10-CM | POA: Insufficient documentation

## 2021-02-25 DIAGNOSIS — Z89512 Acquired absence of left leg below knee: Secondary | ICD-10-CM | POA: Diagnosis not present

## 2021-02-25 DIAGNOSIS — Z794 Long term (current) use of insulin: Secondary | ICD-10-CM | POA: Diagnosis not present

## 2021-02-25 DIAGNOSIS — E1140 Type 2 diabetes mellitus with diabetic neuropathy, unspecified: Secondary | ICD-10-CM | POA: Insufficient documentation

## 2021-02-25 DIAGNOSIS — R601 Generalized edema: Secondary | ICD-10-CM | POA: Insufficient documentation

## 2021-02-25 DIAGNOSIS — N049 Nephrotic syndrome with unspecified morphologic changes: Secondary | ICD-10-CM | POA: Diagnosis not present

## 2021-02-25 DIAGNOSIS — Z79899 Other long term (current) drug therapy: Secondary | ICD-10-CM | POA: Diagnosis not present

## 2021-02-25 DIAGNOSIS — Z888 Allergy status to other drugs, medicaments and biological substances status: Secondary | ICD-10-CM | POA: Insufficient documentation

## 2021-02-25 DIAGNOSIS — E1122 Type 2 diabetes mellitus with diabetic chronic kidney disease: Secondary | ICD-10-CM | POA: Insufficient documentation

## 2021-02-25 DIAGNOSIS — Z833 Family history of diabetes mellitus: Secondary | ICD-10-CM | POA: Diagnosis not present

## 2021-02-25 DIAGNOSIS — N184 Chronic kidney disease, stage 4 (severe): Secondary | ICD-10-CM | POA: Insufficient documentation

## 2021-02-25 DIAGNOSIS — E11628 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 complications: Secondary | ICD-10-CM | POA: Insufficient documentation

## 2021-02-25 DIAGNOSIS — I10 Essential (primary) hypertension: Secondary | ICD-10-CM | POA: Diagnosis not present

## 2021-02-25 LAB — POCT GLYCOSYLATED HEMOGLOBIN (HGB A1C): HbA1c, POC (controlled diabetic range): 7.1 % — AB (ref 0.0–7.0)

## 2021-02-25 LAB — GLUCOSE, POCT (MANUAL RESULT ENTRY): POC Glucose: 274 mg/dl — AB (ref 70–99)

## 2021-02-25 MED ORDER — LOSARTAN POTASSIUM 100 MG PO TABS
100.0000 mg | ORAL_TABLET | Freq: Every day | ORAL | 6 refills | Status: DC
  Filled 2021-02-25: qty 30, 30d supply, fill #0

## 2021-02-25 MED ORDER — CLONIDINE HCL 0.1 MG PO TABS
0.1000 mg | ORAL_TABLET | Freq: Once | ORAL | Status: AC
  Administered 2021-02-25: 0.1 mg via ORAL

## 2021-02-25 MED ORDER — FUROSEMIDE 40 MG PO TABS
40.0000 mg | ORAL_TABLET | Freq: Every day | ORAL | 3 refills | Status: DC
  Filled 2021-02-25: qty 30, 30d supply, fill #0

## 2021-02-25 NOTE — Patient Instructions (Addendum)
Kidney Doctor: Sent referral to [facility]  Ph. # [phone] Address [address] [place] [address] They will contact the patient to schedule an appointment.

## 2021-02-25 NOTE — Progress Notes (Signed)
Not sleeping at night.

## 2021-02-25 NOTE — Progress Notes (Signed)
Subjective:  Patient ID: Todd Mccoy, male    DOB: 10/23/1970  Age: 50 y.o. MRN: [id number]  CC: Diabetes   HPI [name] is a 50 y.o. year old male with a history of Type 2 diabetes mellitus (A1c 7.1), Diabetic retinopathy, Stage IV CKD hypertension, status post left BKA (secondary to osteomyelitis).  Interval History: BP at his last visit was 178/117, Coreg dose was increased and he was scheduled to follow-up on his blood pressure today. Blood pressure today is 205/134 and he is yet to take his antihypertensive. He has dyspnea and pedal edema and has been seen at the ED on couple of occasions for scrotal edema and peripheral edema.  I had referred him to Nephrology at his last visit and he was referred to Dr. Marval Regal per notes in epic but patient states when he went to the appointment the clinic was closed.  Past Medical History:  Diagnosis Date   Anemia    Anxiety    Diabetes mellitus without complication ([place])    Diabetic neuropathy ([place])    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[place]) 03/25/2020   pt states retinal eye damage- left worse than the right- recent visited MD    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[place]) 03/25/2020   pt states recent MD visit /left eye worse than right   Dyslipidemia 12/10/2020   Hypertension    Pneumonia    Stage 3a chronic kidney disease ([place]) 12/10/2020    Past Surgical History:  Procedure Laterality Date   ABSCESS DRAINAGE     neck   AMPUTATION Left 11/14/2020   Procedure: LEFT 5TH RAY AMPUTATION;  Surgeon: Newt Minion, MD;  Location: [facility];  Service: Orthopedics;  Laterality: Left;   AMPUTATION Left 12/10/2020   Procedure: LEFT BELOW KNEE AMPUTATION;  Surgeon: Newt Minion, MD;  Location: [facility];  Service: Orthopedics;  Laterality: Left;   AMPUTATION TOE Right 03/25/2020   Procedure: AMPUTATION TOE;  Surgeon: Evelina Bucy, DPM;  Location[facility];  Service: Podiatry;  Laterality: Right;   INCISION AND DRAINAGE  ABSCESS Right 09/07/2014   Procedure: INCISION AND DRAINAGE ABSCESS RIGHT FLANK;  Surgeon: Jackolyn Confer, MD;  Location[facility];  Service: General;  Laterality: Right;   LEG AMPUTATION BELOW KNEE Left 12/10/2020   SCROTUM EXPLORATION      Family History  Problem Relation Age of Onset   Diabetes Mother    Diabetes Father    Diabetes Brother    Colon cancer Neg Hx    Esophageal cancer Neg Hx    Pancreatic cancer Neg Hx    Stomach cancer Neg Hx    Liver disease Neg Hx     Allergies  Allergen Reactions   Amlodipine Swelling    BLE edema    Outpatient Medications Prior to Visit  Medication Sig Dispense Refill   acetaminophen (TYLENOL) 325 MG tablet Take 650 mg by mouth every 6 (six) hours as needed for mild pain.     atorvastatin (LIPITOR) 20 MG tablet Take 1 tablet (20 mg total) by mouth daily. 30 tablet 3   carvedilol (COREG) 25 MG tablet Take 1 tablet (25 mg total) by mouth 2 (two) times daily with a meal. 60 tablet 3   ferrous sulfate 325 (65 FE) MG tablet Take 1 tablet (325 mg total) by mouth 3 (three) times daily with meals. 90 tablet 2   gabapentin (NEURONTIN) 300 MG capsule Take 1 capsule (300 mg total) by mouth  2 (two) times daily. 60 capsule 5   insulin glargine (LANTUS) 100 UNIT/ML Solostar Pen Inject 20 Units into the skin at bedtime. 18 mL 3   Insulin Pen Needle 32G X 4 MM MISC USE TO INJECT INSULIN DAILY. 100 each 3   isosorbide mononitrate (IMDUR) 30 MG 24 hr tablet Take 1 tablet (30 mg total) by mouth daily. 30 tablet 3   Naphazoline HCl (CLEAR EYES OP) Place 1 drop into both eyes as needed (irritation).     oxyCODONE (OXY IR/ROXICODONE) 5 MG immediate release tablet Take 1 tablet (5 mg total) by mouth every 4 (four) hours as needed for moderate pain (pain score 4-6). 30 tablet 0   polyethylene glycol powder (GLYCOLAX/MIRALAX) 17 GM/SCOOP powder Take 17 g by mouth 2 (two) times daily as needed. 3350 g 1   furosemide (LASIX) 20 MG tablet Take 1 tablet (20 mg total) by  mouth daily. 30 tablet 3   No facility-administered medications prior to visit.     ROS Review of Systems  Constitutional:  Negative for activity change and appetite change.  HENT:  Negative for sinus pressure and sore throat.   Eyes:  Negative for visual disturbance.  Respiratory:  Positive for shortness of breath. Negative for cough and chest tightness.   Cardiovascular:  Positive for leg swelling. Negative for chest pain.  Gastrointestinal:  Negative for abdominal distention, abdominal pain, constipation and diarrhea.  Endocrine: Negative.   Genitourinary:  Negative for dysuria.  Musculoskeletal:  Negative for joint swelling and myalgias.  Skin:  Negative for rash.  Allergic/Immunologic: Negative.   Neurological:  Negative for weakness, light-headedness and numbness.  Psychiatric/Behavioral:  Negative for dysphoric mood and suicidal ideas.    Objective:  BP (!) 193/114   Pulse 79   SpO2 95%   BP/Weight 02/25/2021 02/17/2021 123456  Systolic BP 0000000 0000000 0000000  Diastolic BP 99991111 123456 123456  Wt. (Lbs) - - 174.16  BMI - - 24.99      Physical Exam Constitutional:      Appearance: He is well-developed.  Cardiovascular:     Rate and Rhythm: Normal rate.     Heart sounds: Normal heart sounds. No murmur heard. Pulmonary:     [place]: Pulmonary effort is normal.     Breath sounds: Rales present. No wheezing.  Chest:     Chest wall: No tenderness.  Abdominal:     General: Bowel sounds are normal. There is no distension.     Palpations: Abdomen is soft. There is no mass.     Tenderness: There is no abdominal tenderness.  Musculoskeletal:        General: Normal range of motion.     Right lower leg: Edema present.     Comments: Left BKA Edema of right lower extremity  Neurological:     Mental Status: He is alert and oriented to person, place, and time.  Psychiatric:        Mood and Affect: Mood normal.    CMP Latest Ref Rng & Units 02/17/2021 02/11/2021 01/21/2021  Glucose  70 - 99 mg/dL 274(H) 221(H) 97  BUN 6 - 20 mg/dL 44(H) 44(H) 42(H)  Creatinine 0.61 - 1.24 mg/dL 2.53(H) 2.44(H) 2.56(H)  Sodium 135 - 145 mmol/L 140 140 142  Potassium 3.5 - 5.1 mmol/L 4.0 4.1 4.4  Chloride 98 - 111 mmol/L 112(H) 112(H) 105  CO2 22 - 32 mmol/L 21(L) 23 22  Calcium 8.9 - 10.3 mg/dL 8.1(L) 8.0(L) 8.1(L)  Total Protein 6.5 -  8.1 g/dL 6.2(L) 6.5 6.2  Total Bilirubin 0.3 - 1.2 mg/dL 1.8(H) 1.3(H) 1.0  Alkaline Phos 38 - 126 U/L 350(H) 238(H) 220(H)  AST 15 - 41 U/L 33 38 29  ALT 0 - 44 U/L 53(H) 46(H) 25    Lipid Panel     Component Value Date/Time   CHOL 132 05/23/2020 1144   TRIG 60 05/23/2020 1144   HDL 42 05/23/2020 1144   CHOLHDL 3.1 05/23/2020 1144   LDLCALC 77 05/23/2020 1144    CBC    Component Value Date/Time   WBC 5.7 02/17/2021 0600   RBC 2.99 (L) 02/17/2021 0600   HGB 8.4 (L) 02/17/2021 0600   HGB 8.4 (L) 04/17/2020 1716   HCT 28.4 (L) 02/17/2021 0600   HCT 26.3 (L) 04/17/2020 1716   PLT 214 02/17/2021 0600   PLT 197 04/17/2020 1716   MCV 95.0 02/17/2021 0600   MCV 85 04/17/2020 1716   MCH 28.1 02/17/2021 0600   MCHC 29.6 (L) 02/17/2021 0600   RDW 20.0 (H) 02/17/2021 0600   RDW 14.8 04/17/2020 1716   LYMPHSABS 0.9 02/17/2021 0600   LYMPHSABS 1.9 04/17/2020 1716   MONOABS 0.5 02/17/2021 0600   EOSABS 0.1 02/17/2021 0600   EOSABS 0.1 04/17/2020 1716   BASOSABS 0.0 02/17/2021 0600   BASOSABS 0.0 04/17/2020 1716    Lab Results  Component Value Date   HGBA1C 7.1 (A) 02/25/2021    Assessment & Plan:  1. 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 complication, unspecified whether long term insulin use (HCC) Controlled with A1c of 7.1 Continue current medications - POCT glucose (manual entry) - POCT glycosylated hemoglobin (Hb A1C)  2. Renal anasarca Increase Lasix dose He needs to see Nephrologist for renal replacement therapy might be the solution to his ongoing edema.  Provided with phone number to [name] Advised to present  to the ED if symptoms worsen - furosemide (LASIX) 40 MG tablet; Take 1 tablet (40 mg total) by mouth daily.  Dispense: 30 tablet; Refill: 3  3. Accelerated hypertension Yet to take antihypertensives Clonidine 0.1 mg administered in clinic and blood pressure reassessed after 30 minutes Compliance has been strongly emphasized Losartan added to regimen as he states blood pressures at home have still been elevated. - losartan (COZAAR) 100 MG tablet; Take 1 tablet (100 mg total) by mouth daily.  Dispense: 30 tablet; Refill: 6 - cloNIDine (CATAPRES) tablet 0.1 mg   Meds ordered this encounter  Medications   furosemide (LASIX) 40 MG tablet    Sig: Take 1 tablet (40 mg total) by mouth daily.    Dispense:  30 tablet    Refill:  3    Dose increase   losartan (COZAAR) 100 MG tablet    Sig: Take 1 tablet (100 mg total) by mouth daily.    Dispense:  30 tablet    Refill:  6   cloNIDine (CATAPRES) tablet 0.1 mg          [name], MD, FAAFP. [facility] [place], [place]   02/25/2021, 5:56 PM

## 2021-02-27 ENCOUNTER — Inpatient Hospital Stay (HOSPITAL_COMMUNITY): Payer: Medicaid Other

## 2021-02-27 ENCOUNTER — Inpatient Hospital Stay (HOSPITAL_COMMUNITY)
Admission: EM | Admit: 2021-02-27 | Discharge: 2021-03-06 | DRG: 291 | Disposition: A | Discharge status: Home / Self Care | Payer: Medicaid Other | Attending: Internal Medicine | Admitting: Internal Medicine

## 2021-02-27 ENCOUNTER — Other Ambulatory Visit: Payer: Self-pay

## 2021-02-27 ENCOUNTER — Encounter (HOSPITAL_COMMUNITY): Payer: Self-pay

## 2021-02-27 ENCOUNTER — Emergency Department (HOSPITAL_COMMUNITY): Payer: Medicaid Other

## 2021-02-27 DIAGNOSIS — I160 Hypertensive urgency: Secondary | ICD-10-CM

## 2021-02-27 DIAGNOSIS — D509 Iron deficiency anemia, unspecified: Secondary | ICD-10-CM | POA: Diagnosis present

## 2021-02-27 DIAGNOSIS — Z89512 Acquired absence of left leg below knee: Secondary | ICD-10-CM

## 2021-02-27 DIAGNOSIS — Z993 Dependence on wheelchair: Secondary | ICD-10-CM

## 2021-02-27 DIAGNOSIS — R601 Generalized edema: Secondary | ICD-10-CM | POA: Diagnosis not present

## 2021-02-27 DIAGNOSIS — Z20822 Contact with and (suspected) exposure to covid-19: Secondary | ICD-10-CM | POA: Diagnosis present

## 2021-02-27 DIAGNOSIS — Z888 Allergy status to other drugs, medicaments and biological substances status: Secondary | ICD-10-CM

## 2021-02-27 DIAGNOSIS — L97919 Non-pressure chronic ulcer of unspecified part of right lower leg with unspecified severity: Secondary | ICD-10-CM | POA: Diagnosis present

## 2021-02-27 DIAGNOSIS — E11622 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 ulcer: Secondary | ICD-10-CM | POA: Diagnosis present

## 2021-02-27 DIAGNOSIS — D649 Anemia, unspecified: Secondary | ICD-10-CM | POA: Diagnosis present

## 2021-02-27 DIAGNOSIS — I429 Cardiomyopathy, unspecified: Secondary | ICD-10-CM | POA: Diagnosis present

## 2021-02-27 DIAGNOSIS — R0602 Shortness of breath: Secondary | ICD-10-CM | POA: Diagnosis not present

## 2021-02-27 DIAGNOSIS — E1165 Type 2 diabetes mellitus with hyperglycemia: Secondary | ICD-10-CM | POA: Diagnosis present

## 2021-02-27 DIAGNOSIS — J90 Pleural effusion, not elsewhere classified: Secondary | ICD-10-CM | POA: Diagnosis not present

## 2021-02-27 DIAGNOSIS — N179 Acute kidney failure, unspecified: Secondary | ICD-10-CM | POA: Diagnosis not present

## 2021-02-27 DIAGNOSIS — Z7984 Long term (current) use of oral hypoglycemic drugs: Secondary | ICD-10-CM

## 2021-02-27 DIAGNOSIS — I10 Essential (primary) hypertension: Secondary | ICD-10-CM | POA: Diagnosis not present

## 2021-02-27 DIAGNOSIS — I472 Ventricular tachycardia: Secondary | ICD-10-CM | POA: Diagnosis not present

## 2021-02-27 DIAGNOSIS — E11628 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 complications: Secondary | ICD-10-CM

## 2021-02-27 DIAGNOSIS — E785 Hyperlipidemia, unspecified: Secondary | ICD-10-CM | POA: Diagnosis not present

## 2021-02-27 DIAGNOSIS — E1140 Type 2 diabetes mellitus with diabetic neuropathy, unspecified: Secondary | ICD-10-CM | POA: Diagnosis present

## 2021-02-27 DIAGNOSIS — E8809 Other disorders of plasma-protein metabolism, not elsewhere classified: Secondary | ICD-10-CM | POA: Diagnosis not present

## 2021-02-27 DIAGNOSIS — Z833 Family history of diabetes mellitus: Secondary | ICD-10-CM

## 2021-02-27 DIAGNOSIS — L97929 Non-pressure chronic ulcer of unspecified part of left lower leg with unspecified severity: Secondary | ICD-10-CM | POA: Diagnosis present

## 2021-02-27 DIAGNOSIS — E1122 Type 2 diabetes mellitus with diabetic chronic kidney disease: Secondary | ICD-10-CM | POA: Diagnosis not present

## 2021-02-27 DIAGNOSIS — Z89421 Acquired absence of other right toe(s): Secondary | ICD-10-CM

## 2021-02-27 DIAGNOSIS — I129 Hypertensive chronic kidney disease with stage 1 through stage 4 chronic kidney disease, or unspecified chronic kidney disease: Secondary | ICD-10-CM | POA: Diagnosis not present

## 2021-02-27 DIAGNOSIS — I509 Heart failure, unspecified: Secondary | ICD-10-CM | POA: Diagnosis not present

## 2021-02-27 DIAGNOSIS — E1129 Type 2 diabetes mellitus with other diabetic kidney complication: Secondary | ICD-10-CM | POA: Diagnosis not present

## 2021-02-27 DIAGNOSIS — Z6841 Body Mass Index (BMI) 40.0 and over, adult: Secondary | ICD-10-CM

## 2021-02-27 DIAGNOSIS — Z9119 Patient's noncompliance with other medical treatment and regimen: Secondary | ICD-10-CM

## 2021-02-27 DIAGNOSIS — Z79899 Other long term (current) drug therapy: Secondary | ICD-10-CM

## 2021-02-27 DIAGNOSIS — I313 Pericardial effusion (noninflammatory): Secondary | ICD-10-CM | POA: Diagnosis present

## 2021-02-27 DIAGNOSIS — I5041 Acute combined systolic (congestive) and diastolic (congestive) heart failure: Secondary | ICD-10-CM | POA: Diagnosis not present

## 2021-02-27 DIAGNOSIS — J811 Chronic pulmonary edema: Secondary | ICD-10-CM | POA: Diagnosis not present

## 2021-02-27 DIAGNOSIS — R400 Somnolence: Secondary | ICD-10-CM | POA: Diagnosis present

## 2021-02-27 DIAGNOSIS — H548 Legal blindness, as defined in USA: Secondary | ICD-10-CM | POA: Diagnosis present

## 2021-02-27 DIAGNOSIS — N189 Chronic kidney disease, unspecified: Secondary | ICD-10-CM

## 2021-02-27 DIAGNOSIS — Z794 Long term (current) use of insulin: Secondary | ICD-10-CM | POA: Diagnosis not present

## 2021-02-27 DIAGNOSIS — N183 Chronic kidney disease, stage 3 unspecified: Secondary | ICD-10-CM | POA: Diagnosis present

## 2021-02-27 DIAGNOSIS — I272 Pulmonary hypertension, unspecified: Secondary | ICD-10-CM | POA: Diagnosis not present

## 2021-02-27 DIAGNOSIS — N5089 Other specified disorders of the male genital organs: Secondary | ICD-10-CM | POA: Diagnosis present

## 2021-02-27 DIAGNOSIS — E11319 Type 2 diabetes mellitus with unspecified diabetic retinopathy without macular edema: Secondary | ICD-10-CM | POA: Diagnosis present

## 2021-02-27 DIAGNOSIS — Z419 Encounter for procedure for purposes other than remedying health state, unspecified: Secondary | ICD-10-CM | POA: Diagnosis not present

## 2021-02-27 DIAGNOSIS — F17210 Nicotine dependence, cigarettes, uncomplicated: Secondary | ICD-10-CM | POA: Diagnosis present

## 2021-02-27 DIAGNOSIS — E119 Type 2 diabetes mellitus without complications: Secondary | ICD-10-CM

## 2021-02-27 DIAGNOSIS — I130 Hypertensive heart and chronic kidney disease with heart failure and stage 1 through stage 4 chronic kidney disease, or unspecified chronic kidney disease: Secondary | ICD-10-CM | POA: Diagnosis not present

## 2021-02-27 DIAGNOSIS — N184 Chronic kidney disease, stage 4 (severe): Secondary | ICD-10-CM | POA: Diagnosis not present

## 2021-02-27 DIAGNOSIS — N049 Nephrotic syndrome with unspecified morphologic changes: Secondary | ICD-10-CM | POA: Diagnosis present

## 2021-02-27 DIAGNOSIS — Z720 Tobacco use: Secondary | ICD-10-CM | POA: Diagnosis present

## 2021-02-27 DIAGNOSIS — M7989 Other specified soft tissue disorders: Secondary | ICD-10-CM | POA: Diagnosis not present

## 2021-02-27 DIAGNOSIS — E8779 Other fluid overload: Secondary | ICD-10-CM | POA: Diagnosis not present

## 2021-02-27 HISTORY — DX: Osteomyelitis, unspecified: M86.9

## 2021-02-27 HISTORY — DX: Morbid (severe) obesity due to excess calories: E66.01

## 2021-02-27 HISTORY — DX: Type 2 diabetes mellitus with unspecified complications: E11.8

## 2021-02-27 HISTORY — DX: Acquired absence of left leg below knee: Z89.512

## 2021-02-27 LAB — CBC WITH DIFFERENTIAL/PLATELET
Abs Immature Granulocytes: 0.03 10*3/uL (ref 0.00–0.07)
Basophils Absolute: 0 10*3/uL (ref 0.0–0.1)
Basophils Relative: 0 %
Eosinophils Absolute: 0.1 10*3/uL (ref 0.0–0.5)
Eosinophils Relative: 2 %
HCT: 30.1 % — ABNORMAL LOW (ref 39.0–52.0)
Hemoglobin: 9.1 g/dL — ABNORMAL LOW (ref 13.0–17.0)
Immature Granulocytes: 0 %
Lymphocytes Relative: 11 %
Lymphs Abs: 0.8 10*3/uL (ref 0.7–4.0)
MCH: 28.7 pg (ref 26.0–34.0)
MCHC: 30.2 g/dL (ref 30.0–36.0)
MCV: 95 fL (ref 80.0–100.0)
Monocytes Absolute: 0.6 10*3/uL (ref 0.1–1.0)
Monocytes Relative: 9 %
Neutro Abs: 5.6 10*3/uL (ref 1.7–7.7)
Neutrophils Relative %: 78 %
Platelets: 218 10*3/uL (ref 150–400)
RBC: 3.17 MIL/uL — ABNORMAL LOW (ref 4.22–5.81)
RDW: 19.4 % — ABNORMAL HIGH (ref 11.5–15.5)
WBC: 7.2 10*3/uL (ref 4.0–10.5)
nRBC: 0 % (ref 0.0–0.2)

## 2021-02-27 LAB — URINALYSIS, ROUTINE W REFLEX MICROSCOPIC
Bilirubin Urine: NEGATIVE
Glucose, UA: 50 mg/dL — AB
Ketones, ur: NEGATIVE mg/dL
Leukocytes,Ua: NEGATIVE
Nitrite: NEGATIVE
Protein, ur: >=300 mg/dL — AB
Specific Gravity, Urine: 1.023 (ref 1.005–1.030)
pH: 5 (ref 5.0–8.0)

## 2021-02-27 LAB — HEPATIC FUNCTION PANEL
ALT: 32 U/L (ref 0–44)
AST: 26 U/L (ref 15–41)
Albumin: 2 g/dL — ABNORMAL LOW (ref 3.5–5.0)
Alkaline Phosphatase: 344 U/L — ABNORMAL HIGH (ref 38–126)
Bilirubin, Direct: 0.5 mg/dL — ABNORMAL HIGH (ref 0.0–0.2)
Indirect Bilirubin: 0.7 mg/dL (ref 0.3–0.9)
Total Bilirubin: 1.2 mg/dL (ref 0.3–1.2)
Total Protein: 6.5 g/dL (ref 6.5–8.1)

## 2021-02-27 LAB — RESP PANEL BY RT-PCR (FLU A&B, COVID) ARPGX2
Influenza A by PCR: NEGATIVE
Influenza B by PCR: NEGATIVE
SARS Coronavirus 2 by RT PCR: NEGATIVE

## 2021-02-27 LAB — RAPID URINE DRUG SCREEN, HOSP PERFORMED
Amphetamines: NOT DETECTED
Barbiturates: NOT DETECTED
Benzodiazepines: NOT DETECTED
Cocaine: NOT DETECTED
Opiates: NOT DETECTED
Tetrahydrocannabinol: NOT DETECTED

## 2021-02-27 LAB — BASIC METABOLIC PANEL
Anion gap: 6 (ref 5–15)
BUN: 51 mg/dL — ABNORMAL HIGH (ref 6–20)
CO2: 22 mmol/L (ref 22–32)
Calcium: 8 mg/dL — ABNORMAL LOW (ref 8.9–10.3)
Chloride: 113 mmol/L — ABNORMAL HIGH (ref 98–111)
Creatinine, Ser: 2.5 mg/dL — ABNORMAL HIGH (ref 0.61–1.24)
GFR, Estimated: 31 mL/min — ABNORMAL LOW (ref >60)
Glucose, Bld: 225 mg/dL — ABNORMAL HIGH (ref 70–99)
Potassium: 3.8 mmol/L (ref 3.5–5.1)
Sodium: 141 mmol/L (ref 135–145)

## 2021-02-27 LAB — PROTEIN / CREATININE RATIO, URINE
Creatinine, Urine: 131.25 mg/dL
Protein Creatinine Ratio: 7.24 mg/mg{Cre} — ABNORMAL HIGH (ref 0.00–0.15)
Total Protein, Urine: 950 mg/dL

## 2021-02-27 LAB — ECHOCARDIOGRAM COMPLETE
AR max vel: 4.65 cm2
AV Area VTI: 4.57 cm2
AV Area mean vel: 4.59 cm2
AV Mean grad: 2 mmHg
AV Peak grad: 3.1 mmHg
Ao pk vel: 0.88 m/s
Area-P 1/2: 4.94 cm2
Calc EF: 15.7 %
Height: 69 in
MV VTI: 3.16 cm2
S' Lateral: 3.6 cm
Single Plane A2C EF: 21.5 %
Single Plane A4C EF: 7.9 %
Weight: 4800 oz

## 2021-02-27 LAB — TSH: TSH: 2.455 u[IU]/mL (ref 0.350–4.500)

## 2021-02-27 LAB — HEMOGLOBIN A1C
Hgb A1c MFr Bld: 7.2 % — ABNORMAL HIGH (ref 4.8–5.6)
Mean Plasma Glucose: 159.94 mg/dL

## 2021-02-27 LAB — CREATININE, SERUM
Creatinine, Ser: 2.64 mg/dL — ABNORMAL HIGH (ref 0.61–1.24)
GFR, Estimated: 29 mL/min — ABNORMAL LOW (ref >60)

## 2021-02-27 LAB — BRAIN NATRIURETIC PEPTIDE: B Natriuretic Peptide: >4500 pg/mL — ABNORMAL HIGH (ref 0.0–100.0)

## 2021-02-27 LAB — MAGNESIUM: Magnesium: 1.8 mg/dL (ref 1.7–2.4)

## 2021-02-27 LAB — GLUCOSE, CAPILLARY
Glucose-Capillary: 156 mg/dL — ABNORMAL HIGH (ref 70–99)
Glucose-Capillary: 156 mg/dL — ABNORMAL HIGH (ref 70–99)

## 2021-02-27 LAB — CBG MONITORING, ED
Glucose-Capillary: 225 mg/dL — ABNORMAL HIGH (ref 70–99)
Glucose-Capillary: 204 mg/dL — ABNORMAL HIGH (ref 70–99)

## 2021-02-27 MED ORDER — ACETAMINOPHEN 325 MG PO TABS
650.0000 mg | ORAL_TABLET | ORAL | Status: DC | PRN
Start: 2021-02-27 — End: 2021-03-06
  Administered 2021-02-27 – 2021-03-06 (×4): 650 mg via ORAL
  Filled 2021-02-27 (×4): qty 2

## 2021-02-27 MED ORDER — HYDRALAZINE HCL 25 MG PO TABS
25.0000 mg | ORAL_TABLET | Freq: Three times a day (TID) | ORAL | Status: DC
  Administered 2021-02-27 – 2021-02-28 (×3): 25 mg via ORAL
  Filled 2021-02-27 (×3): qty 1

## 2021-02-27 MED ORDER — ALBUMIN HUMAN 25 % IV SOLN
12.5000 g | Freq: Three times a day (TID) | INTRAVENOUS | Status: AC
  Administered 2021-02-27 – 2021-03-01 (×6): 12.5 g via INTRAVENOUS
  Filled 2021-02-27 (×6): qty 50

## 2021-02-27 MED ORDER — CARVEDILOL 25 MG PO TABS
25.0000 mg | ORAL_TABLET | Freq: Two times a day (BID) | ORAL | Status: DC
  Administered 2021-02-27 – 2021-03-06 (×14): 25 mg via ORAL
  Filled 2021-02-27 (×12): qty 1
  Filled 2021-02-27: qty 2
  Filled 2021-02-27 (×3): qty 1

## 2021-02-27 MED ORDER — FUROSEMIDE 10 MG/ML IJ SOLN
100.0000 mg | Freq: Three times a day (TID) | INTRAVENOUS | Status: DC
  Administered 2021-02-27 – 2021-02-28 (×3): 100 mg via INTRAVENOUS
  Filled 2021-02-27 (×3): qty 10

## 2021-02-27 MED ORDER — SODIUM CHLORIDE 0.9 % IV SOLN
250.0000 mL | INTRAVENOUS | Status: DC | PRN
  Administered 2021-03-03: 250 mL via INTRAVENOUS

## 2021-02-27 MED ORDER — HYDRALAZINE HCL 20 MG/ML IJ SOLN
10.0000 mg | Freq: Four times a day (QID) | INTRAMUSCULAR | Status: DC | PRN
  Administered 2021-02-28: 10 mg via INTRAVENOUS
  Filled 2021-02-27: qty 1

## 2021-02-27 MED ORDER — FUROSEMIDE 10 MG/ML IJ SOLN
80.0000 mg | Freq: Once | INTRAMUSCULAR | Status: AC
  Administered 2021-02-27: 80 mg via INTRAVENOUS
  Filled 2021-02-27: qty 8

## 2021-02-27 MED ORDER — POLYVINYL ALCOHOL 1.4 % OP SOLN
1.0000 [drp] | OPHTHALMIC | Status: DC | PRN
  Administered 2021-02-27 – 2021-03-01 (×2): 1 [drp] via OPHTHALMIC
  Filled 2021-02-27: qty 15

## 2021-02-27 MED ORDER — HEPARIN SODIUM (PORCINE) 5000 UNIT/ML IJ SOLN
5000.0000 [IU] | Freq: Three times a day (TID) | INTRAMUSCULAR | Status: DC
  Administered 2021-02-27 – 2021-03-06 (×23): 5000 [IU] via SUBCUTANEOUS
  Filled 2021-02-27 (×23): qty 1

## 2021-02-27 MED ORDER — INSULIN ASPART 100 UNIT/ML IJ SOLN
0.0000 [IU] | Freq: Three times a day (TID) | INTRAMUSCULAR | Status: DC
  Administered 2021-02-27: 5 [IU] via SUBCUTANEOUS
  Administered 2021-02-27: 3 [IU] via SUBCUTANEOUS
  Administered 2021-02-27: 5 [IU] via SUBCUTANEOUS
  Administered 2021-02-28: 3 [IU] via SUBCUTANEOUS
  Administered 2021-02-28: 2 [IU] via SUBCUTANEOUS
  Administered 2021-02-28 – 2021-03-01 (×2): 3 [IU] via SUBCUTANEOUS
  Administered 2021-03-01: 2 [IU] via SUBCUTANEOUS
  Administered 2021-03-01: 3 [IU] via SUBCUTANEOUS
  Administered 2021-03-02: 8 [IU] via SUBCUTANEOUS
  Administered 2021-03-02: 3 [IU] via SUBCUTANEOUS
  Administered 2021-03-02: 2 [IU] via SUBCUTANEOUS
  Administered 2021-03-03: 3 [IU] via SUBCUTANEOUS
  Administered 2021-03-03: 5 [IU] via SUBCUTANEOUS
  Administered 2021-03-03 – 2021-03-04 (×3): 3 [IU] via SUBCUTANEOUS
  Administered 2021-03-04: 5 [IU] via SUBCUTANEOUS
  Administered 2021-03-05 – 2021-03-06 (×3): 3 [IU] via SUBCUTANEOUS
  Filled 2021-02-27: qty 0.15

## 2021-02-27 MED ORDER — POTASSIUM CHLORIDE CRYS ER 10 MEQ PO TBCR
10.0000 meq | EXTENDED_RELEASE_TABLET | Freq: Two times a day (BID) | ORAL | Status: DC
  Administered 2021-02-27 – 2021-03-06 (×15): 10 meq via ORAL
  Filled 2021-02-27 (×15): qty 1

## 2021-02-27 MED ORDER — FUROSEMIDE 10 MG/ML IJ SOLN
40.0000 mg | Freq: Two times a day (BID) | INTRAMUSCULAR | Status: DC
  Administered 2021-02-27: 40 mg via INTRAVENOUS
  Filled 2021-02-27: qty 4

## 2021-02-27 MED ORDER — GABAPENTIN 300 MG PO CAPS
300.0000 mg | ORAL_CAPSULE | Freq: Two times a day (BID) | ORAL | Status: DC
  Administered 2021-02-27 – 2021-03-06 (×14): 300 mg via ORAL
  Filled 2021-02-27 (×15): qty 1

## 2021-02-27 MED ORDER — MAGNESIUM SULFATE 2 GM/50ML IV SOLN
2.0000 g | Freq: Once | INTRAVENOUS | Status: AC
  Administered 2021-02-27: 2 g via INTRAVENOUS
  Filled 2021-02-27: qty 50

## 2021-02-27 MED ORDER — ISOSORBIDE MONONITRATE ER 30 MG PO TB24
30.0000 mg | ORAL_TABLET | Freq: Every day | ORAL | Status: DC
  Administered 2021-02-27 – 2021-03-06 (×8): 30 mg via ORAL
  Filled 2021-02-27 (×8): qty 1

## 2021-02-27 MED ORDER — SODIUM CHLORIDE 0.9% FLUSH
3.0000 mL | Freq: Two times a day (BID) | INTRAVENOUS | Status: DC
  Administered 2021-02-27 – 2021-03-06 (×10): 3 mL via INTRAVENOUS

## 2021-02-27 MED ORDER — ONDANSETRON HCL 4 MG/2ML IJ SOLN: 4.0000 mg | Freq: Four times a day (QID) | INTRAMUSCULAR | Status: DC | PRN

## 2021-02-27 MED ORDER — OXYCODONE HCL 5 MG PO TABS
5.0000 mg | ORAL_TABLET | ORAL | Status: DC | PRN
  Administered 2021-02-28 – 2021-03-04 (×4): 5 mg via ORAL
  Filled 2021-02-27 (×4): qty 1

## 2021-02-27 MED ORDER — ATORVASTATIN CALCIUM 20 MG PO TABS
20.0000 mg | ORAL_TABLET | Freq: Every day | ORAL | Status: DC
  Administered 2021-02-27 – 2021-03-06 (×7): 20 mg via ORAL
  Filled 2021-02-27 (×5): qty 1
  Filled 2021-02-27: qty 2
  Filled 2021-02-27 (×2): qty 1

## 2021-02-27 MED ORDER — SODIUM CHLORIDE 0.9% FLUSH: 3.0000 mL | INTRAVENOUS | Status: DC | PRN

## 2021-02-27 MED ORDER — INSULIN ASPART 100 UNIT/ML IJ SOLN
0.0000 [IU] | Freq: Every day | INTRAMUSCULAR | Status: DC
  Administered 2021-03-02 – 2021-03-04 (×2): 2 [IU] via SUBCUTANEOUS
  Filled 2021-02-27: qty 0.05

## 2021-02-27 MED ORDER — FERROUS SULFATE 325 (65 FE) MG PO TABS
325.0000 mg | ORAL_TABLET | Freq: Three times a day (TID) | ORAL | Status: DC
  Administered 2021-02-27 – 2021-03-06 (×20): 325 mg via ORAL
  Filled 2021-02-27 (×20): qty 1

## 2021-02-27 NOTE — ED Notes (Signed)
Pt sitting on side of bed attempting to provide urine specimen

## 2021-02-27 NOTE — ED Triage Notes (Signed)
Pt reports generalized swelling worst in extremities, lower abdomen and scrotum since early august. Seen at [facility] on 8/16 for same. Left bka in June. Compliant with furosemide.

## 2021-02-27 NOTE — Progress Notes (Signed)
*  PRELIMINARY RESULTS* Echocardiogram 2D Echocardiogram has been performed.  Luisa Hart RDCS 02/27/2021, 2:08 PM

## 2021-02-27 NOTE — ED Notes (Signed)
No urine in urinal at this time

## 2021-02-27 NOTE — ED Notes (Signed)
Pt is to be NPO after midnight due to having a renal artery study done tomorrow.

## 2021-02-27 NOTE — ED Notes (Signed)
Pt has urinal at bedside 

## 2021-02-27 NOTE — ED Notes (Signed)
Unable to start iv x 2

## 2021-02-27 NOTE — ED Notes (Signed)
Pt stated that he couldn't breathe and it started after covid swab done. Pt wanted oxygen. Pt placed on 3L 02 via [place].

## 2021-02-27 NOTE — ED Notes (Signed)
Complete bed linen changed. Pt placed on male primofit due to incontinence and inability to ambulate. Pt repositioned and placed on cardiac monitoring.

## 2021-02-27 NOTE — ED Notes (Signed)
Pt is asking to sit on the side of the bed. Pt advised it was unsafe for him to sit on the side of the bed due to safety reasons. Pt had visitor to help him sit on the side of the bed. Will continue to monitor.

## 2021-02-27 NOTE — ED Provider Notes (Signed)
I received the patient in signout from Dr. Betsey Holiday, briefly the patient is a 50 year old male with worsening peripheral edema.  Has been going on for months now but worsening for the past few weeks.  Has been seen in the ED now 3 times and is seen his family doctor for this.  Thought to be due to renal dysfunction though no prior echo was seen in the system.  BNP greater than 4500.  Given a dose of Lasix here with some minimal urine output.  Renal function is somewhat better than it was when he was hospitalized for osteomyelitis.  I discussed case with the hospitalist for admission.   Deno Etienne, DO 02/27/21 [phone]

## 2021-02-27 NOTE — ED Provider Notes (Signed)
[facility] DEPT Provider Note   CSN: [id number] Arrival date & time: 02/27/21  0435     History Chief Complaint  Patient presents with   Groin Swelling    Todd Mccoy is a 50 y.o. male.  Patient presents to the emergency department for evaluation of persistent swelling.  Patient reports that this is his third visit with these complaints.  He reports that he has been experiencing significant swelling of both of his legs, scrotum, lower abdomen.  He now reports that his hands are swelling as well.  Patient has been taking his Lasix.  Dose has been doubled.  He has been given IV Lasix in the ED previously.  Patient without chest pain.  He does have a history of kidney problems, reports that he is still making urine.      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[place]) 03/25/2020   pt states recent MD visit /left eye worse than right   Dyslipidemia 12/10/2020   Hypertension    Pneumonia    Stage 3a chronic kidney disease ([place]) 12/10/2020    Patient Active Problem List   Diagnosis Date Noted   Abscess of left foot 12/10/2020   S/P BKA (below knee amputation) unilateral, left ([place]) 12/10/2020   Dyslipidemia 12/10/2020   Stage 3a chronic kidney disease ([place]) 12/10/2020   Subacute osteomyelitis, left ankle and foot (HCC)    Osteomyelitis of fifth toe of left foot (HCC)    Severe protein-calorie malnutrition ([place])    HTN (hypertension) 11/12/2020   Osteomyelitis of second toe of right foot ([place])    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[place])    Cellulitis and abscess of foot    Toe osteomyelitis ([place]) 03/24/2020   Necrotizing fasciitis ([place]) 09/08/2014   Diabetes mellitus with  hyperglycemia ([place]) 09/07/2014   Tobacco abuse 09/07/2014   Abscess of back    Abscess of lower back 09/06/2014   DM2 (diabetes mellitus, type 2) ([place]) 09/06/2014   Sepsis ([place]) 09/06/2014   Scrotal abscess 06/20/2014    Past Surgical History:  Procedure Laterality Date   ABSCESS DRAINAGE     neck   AMPUTATION Left 11/14/2020   Procedure: LEFT 5TH RAY AMPUTATION;  Surgeon: Newt Minion, MD;  Location: [facility];  Service: Orthopedics;  Laterality: Left;   AMPUTATION Left 12/10/2020   Procedure: LEFT BELOW KNEE AMPUTATION;  Surgeon: Newt Minion, MD;  Location: [facility];  Service: Orthopedics;  Laterality: Left;   AMPUTATION TOE Right 03/25/2020   Procedure: AMPUTATION TOE;  Surgeon: Evelina Bucy, DPM;  [facility];  Service: Podiatry;  Laterality: Right;   INCISION AND DRAINAGE ABSCESS Right 09/07/2014   Procedure: INCISION AND DRAINAGE ABSCESS RIGHT FLANK;  Surgeon: Jackolyn Confer, MD;  Location: [facility];  Service: General;  Laterality: Right;   LEG AMPUTATION BELOW KNEE Left 12/10/2020   SCROTUM EXPLORATION         Family History  Problem Relation Age of Onset   Diabetes Mother    Diabetes Father    Diabetes Brother    Colon cancer Neg Hx    Esophageal cancer Neg Hx    Pancreatic  cancer Neg Hx    Stomach cancer Neg Hx    Liver disease Neg Hx     Social History   Tobacco Use   Smoking status: Every Day    Packs/day: 1.00    Years: 32.00    Pack years: 32.00    Types: Cigarettes   Smokeless tobacco: Never   Tobacco comments:    Currently down to 1/2 ppd  Vaping Use   Vaping Use: Never used  Substance Use Topics   Alcohol use: No   Drug use: No    Home Medications Prior to Admission medications   Medication Sig Start Date End Date Taking? Authorizing Provider  acetaminophen (TYLENOL) 325 MG tablet Take 650 mg by mouth every 6 (six) hours as needed for mild pain.    [provider]  atorvastatin (LIPITOR) 20 MG tablet Take 1 tablet (20 mg total) by  mouth daily. 12/19/20   Shahmehdi, Valeria Batman, MD  carvedilol (COREG) 25 MG tablet Take 1 tablet (25 mg total) by mouth 2 (two) times daily with a meal. 01/21/21   [name], MD  ferrous sulfate 325 (65 FE) MG tablet Take 1 tablet (325 mg total) by mouth 3 (three) times daily with meals. 12/19/20 04/11/21  Shahmehdi, Valeria Batman, MD  furosemide (LASIX) 40 MG tablet Take 1 tablet (40 mg total) by mouth daily. 02/25/21 03/27/21  [name], MD  gabapentin (NEURONTIN) 300 MG capsule Take 1 capsule (300 mg total) by mouth 2 (two) times daily. 01/21/21   [name], MD  insulin glargine (LANTUS) 100 UNIT/ML Solostar Pen Inject 20 Units into the skin at bedtime. 12/22/20 12/22/21  Danford, Suann Larry, MD  Insulin Pen Needle 32G X 4 MM MISC USE TO INJECT INSULIN DAILY. 12/19/20 12/19/21  Deatra James, MD  isosorbide mononitrate (IMDUR) 30 MG 24 hr tablet Take 1 tablet (30 mg total) by mouth daily. 01/21/21   [name], MD  losartan (COZAAR) 100 MG tablet Take 1 tablet (100 mg total) by mouth daily. 02/25/21   [name], MD  Naphazoline HCl (CLEAR EYES OP) Place 1 drop into both eyes as needed (irritation).    [provider]  oxyCODONE (OXY IR/ROXICODONE) 5 MG immediate release tablet Take 1 tablet (5 mg total) by mouth every 4 (four) hours as needed for moderate pain (pain score 4-6). 02/18/21   Persons, Bevely Palmer, PA  polyethylene glycol powder (GLYCOLAX/MIRALAX) 17 GM/SCOOP powder Take 17 g by mouth 2 (two) times daily as needed. 01/21/21   [name], MD  insulin aspart protamine- aspart (NOVOLOG MIX 70/30) (70-30) 100 UNIT/ML injection Inject 0.35 mLs (35 Units total) into the skin 2 (two) times daily. 04/17/20 05/13/20  Argentina Donovan, PA-C  losartan-hydrochlorothiazide (HYZAAR) 100-25 MG tablet TAKE 1 TABLET BY MOUTH DAILY. 12/19/20 12/22/20  Shahmehdi, Valeria Batman, MD  metFORMIN (GLUCOPHAGE-XR) 500 MG 24 hr tablet TAKE 1 TABLET (500 MG TOTAL) BY MOUTH 2 (TWO) TIMES DAILY.  12/19/20 12/22/20  Deatra James, MD    Allergies    Amlodipine  Review of Systems   Review of Systems  Cardiovascular:  Positive for leg swelling.  All other systems reviewed and are negative.  Physical Exam Updated Vital Signs BP (!) 234/143 (BP Location: Right Arm)   Pulse 83   Temp (!) 97.5 F (36.4 C) (Oral)   Resp 20   SpO2 100%   Physical Exam Vitals and nursing note reviewed.  Constitutional:      General: He is not  in acute distress.    Appearance: Normal appearance. He is well-developed.  HENT:     Head: Normocephalic and atraumatic.     Right Ear: Hearing normal.     Left Ear: Hearing normal.     Nose: Nose normal.  Eyes:     Conjunctiva/sclera: Conjunctivae normal.     Pupils: Pupils are equal, round, and reactive to light.  Cardiovascular:     Rate and Rhythm: Regular rhythm.     Heart sounds: S1 normal and S2 normal. No murmur heard.   No friction rub. No gallop.  Pulmonary:     Effort: Pulmonary effort is normal. No respiratory distress.     Breath sounds: Normal breath sounds.  Chest:     Chest wall: No tenderness.  Abdominal:     General: Bowel sounds are normal.     Palpations: Abdomen is soft.     Tenderness: There is no abdominal tenderness. There is no guarding or rebound. Negative signs include Murphy's sign and McBurney's sign.     Hernia: No hernia is present.  Genitourinary:    Comments: Significant scrotal swelling Musculoskeletal:        General: Normal range of motion.     Cervical back: Normal range of motion and neck supple.     Right lower leg: Edema present.     Left lower leg: Edema present.     Comments: Significant pitting edema of both lower extremities, ranging up to the lower chest  Nonpitting edema of bilateral hands  Skin:    General: Skin is warm and dry.     Findings: No rash.  Neurological:     Mental Status: He is alert and oriented to person, place, and time.     GCS: GCS eye subscore is 4. GCS verbal  subscore is 5. GCS motor subscore is 6.     Cranial Nerves: No cranial nerve deficit.     Sensory: No sensory deficit.     Coordination: Coordination normal.  Psychiatric:        Speech: Speech normal.        Behavior: Behavior normal.        Thought Content: Thought content normal.    ED Results / Procedures / Treatments   Labs (all labs ordered are listed, but only abnormal results are displayed) Labs Reviewed  RESP PANEL BY RT-PCR (FLU A&B, COVID) ARPGX2  CBC WITH DIFFERENTIAL/PLATELET  BASIC METABOLIC PANEL  HEPATIC FUNCTION PANEL  BRAIN NATRIURETIC PEPTIDE  URINALYSIS, ROUTINE W REFLEX MICROSCOPIC    EKG EKG Interpretation  Date/Time:  Friday February 27 2021 05:04:44 EDT Ventricular Rate:  82 PR Interval:  172 QRS Duration: 89 QT Interval:  458 QTC Calculation: 535 R Axis:   189 Text Interpretation: Sinus rhythm Low voltage with right axis deviation Prolonged QT interval Confirmed by Orpah Greek [phone]) on 02/27/2021 5:16:04 AM  Radiology No results found.  Procedures Procedures   Medications Ordered in ED Medications - No data to display  ED Course  I have reviewed the triage vital signs and the nursing notes.  Pertinent labs & imaging results that were available during my care of the patient were reviewed by me and considered in my medical decision making (see chart for details).    MDM Rules/Calculators/A&P                           Patient presents with severe edema consistent with anasarca.  He  has lower extremity edema that now tracks all the way up to the chest with significant scrotal edema.  Patient seen in the ED twice already in the last week for this, has had home Lasix dose increased and also has been administered IV Lasix in the ED.  Patient reports no response, in fact is worsening.  Will require hospitalization for further management.  At at shift and, labs are still pending.  Will sign to oncoming ER physician to follow.  Final  Clinical Impression(s) / ED Diagnoses Final diagnoses:  Anasarca    Rx / DC Orders ED Discharge Orders     None        Shayona Hibbitts, Gwenyth Allegra, MD 02/27/21 [phone]

## 2021-02-27 NOTE — Consult Note (Addendum)
Cardiology Consultation:   Patient ID: Todd Mccoy MRN: [id number]; DOB: [birth year]  Admit date: 02/27/2021 Date of Consult: 02/27/2021  PCP:  Todd Rakes, MD   [facility] Providers Cardiologist: New to [facility] Click here to update MD or APP on Care Team, Refresh:1}     Patient Profile:   Todd Mccoy is a 50 y.o. 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 Doristine Bosworth.  History of Present Illness:   Todd Mccoy denies any prior cardiac history/cardiac workup or family history of cardiac disease. He has multiple risk factors as above as well as vision issues. He is on disability and Medicaid so financial resources limited.   He reports he has been struggling with significant scrotal edema and lower extremity edema for months. He has been seen by primary care and in the ED several times for this but it has progressed. He is on multiple antihypertensives and Lasix '40mg'$  daily. At last primary care visit his BP was 205/134 as he had not taken his antihypertensives yet. He was felt to have renal anasarca. He had also been referred to nephrology but stated that when he went to the appointment the clinic was closed. He was previously contacted by the [facility] but no-showed to his appointment 8/18. Does not follow low sodium diet. He otherwise reports taking his medications as prescribed recently although cannot specifically name them. He has also had orthopnea. Denies any recent drop off in UOP lately. He primarily uses a wheelchair due to his BKA. No hx syncope, stroke, chest pain, palpitations. Due to worsening edema as well as progressive SOB, he presented to the ED where workup revealed BNP >4500, Cr 2.50 (near prior baseline), K  3.8, albumin 2.0 (historically as low was 1.3), UA >300 protein, TSH Wnl, Mg 1.8, progressive pulmonary edema and bilateral pleural effusions. He has been given '80mg'$  IV Lasix this AM followed by '40mg'$ , written to begin BID, and has begun to put out urine.  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;  Service: Orthopedics;  Laterality: Left;   AMPUTATION Left 12/10/2020   Procedure: LEFT BELOW KNEE AMPUTATION;  Surgeon: Newt Minion, MD;  Location: [facility];  Service: Orthopedics;  Laterality: Left;   AMPUTATION TOE Right 03/25/2020   Procedure: AMPUTATION TOE;  Surgeon: Evelina Bucy, DPM;  [facility];  Service: Podiatry;  Laterality:  Right;   INCISION AND DRAINAGE ABSCESS Right 09/07/2014   Procedure: INCISION AND DRAINAGE ABSCESS RIGHT FLANK;  Surgeon: Jackolyn Confer, MD;  [facility];  Service: General;  Laterality: Right;   LEG AMPUTATION BELOW KNEE Left 12/10/2020   SCROTUM EXPLORATION       Home Medications:  Prior to Admission medications   Medication Sig Start Date End Date Taking? Authorizing Provider  acetaminophen (TYLENOL) 325 MG tablet Take 650 mg by mouth every 6 (six) hours as needed for mild pain.    [provider]  atorvastatin (LIPITOR) 20  MG tablet Take 1 tablet (20 mg total) by mouth daily. 12/19/20   Shahmehdi, Valeria Batman, MD  carvedilol (COREG) 25 MG tablet Take 1 tablet (25 mg total) by mouth 2 (two) times daily with a meal. 01/21/21   Todd Rakes, MD  ferrous sulfate 325 (65 FE) MG tablet Take 1 tablet (325 mg total) by mouth 3 (three) times daily with meals. 12/19/20 04/11/21  Shahmehdi, Valeria Batman, MD  furosemide (LASIX) 40 MG tablet Take 1 tablet (40 mg total) by mouth daily. 02/25/21 03/27/21  Todd Rakes, MD  gabapentin (NEURONTIN) 300 MG capsule Take 1 capsule (300 mg total) by mouth 2 (two) times daily. 01/21/21   Todd Rakes, MD  insulin glargine (LANTUS) 100 UNIT/ML Solostar Pen Inject 20 Units into the skin at bedtime. 12/22/20 12/22/21  Danford, Suann Larry, MD  Insulin Pen Needle 32G X 4 MM MISC USE TO INJECT INSULIN DAILY. 12/19/20 12/19/21  Deatra James, MD  isosorbide mononitrate (IMDUR) 30 MG 24 hr tablet Take 1 tablet (30 mg total) by mouth daily. 01/21/21   Todd Rakes, MD  losartan (COZAAR) 100 MG tablet Take 1 tablet (100 mg total) by mouth daily. 02/25/21   Todd Rakes, MD  Naphazoline HCl (CLEAR EYES OP) Place 1 drop into both eyes as needed (irritation).    [provider]  oxyCODONE (OXY IR/ROXICODONE) 5 MG immediate release tablet Take 1 tablet (5 mg total) by mouth every 4 (four) hours as needed for moderate pain (pain score 4-6). 02/18/21   Persons, Bevely Palmer, PA  polyethylene glycol powder (GLYCOLAX/MIRALAX) 17 GM/SCOOP powder Take 17 g by mouth 2 (two) times daily as needed. 01/21/21   Todd Rakes, MD  insulin aspart protamine- aspart (NOVOLOG MIX 70/30) (70-30) 100 UNIT/ML injection Inject 0.35 mLs (35 Units total) into the skin 2 (two) times daily. 04/17/20 05/13/20  Argentina Donovan, PA-C  losartan-hydrochlorothiazide (HYZAAR) 100-25 MG tablet TAKE 1 TABLET BY MOUTH DAILY. 12/19/20 12/22/20  Shahmehdi, Valeria Batman, MD  metFORMIN (GLUCOPHAGE-XR) 500 MG 24 hr tablet TAKE 1 TABLET (500 MG  TOTAL) BY MOUTH 2 (TWO) TIMES DAILY. 12/19/20 12/22/20  Deatra James, MD    Inpatient Medications: Scheduled Meds:  furosemide  40 mg Intravenous Q12H   heparin  5,000 Units Subcutaneous Q8H   insulin aspart  0-15 Units Subcutaneous TID WC   insulin aspart  0-5 Units Subcutaneous QHS   potassium chloride  10 mEq Oral BID   sodium chloride flush  3 mL Intravenous Q12H   Continuous Infusions:  sodium chloride     magnesium sulfate bolus IVPB     PRN Meds: sodium chloride, acetaminophen, hydrALAZINE, ondansetron (ZOFRAN) IV, sodium chloride flush  Allergies:    Allergies  Allergen Reactions   Amlodipine Swelling    BLE edema    Social History:   Social History   Socioeconomic History   Marital status: Single    Spouse  name: Not on file   Number of children: 1   Years of education: Not on file   Highest education level: Not on file  Occupational History   Not on file  Tobacco Use   Smoking status: Every Day    Packs/day: 1.00    Years: 32.00    Pack years: 32.00    Types: Cigarettes   Smokeless tobacco: Never   Tobacco comments:    Currently down to 1/2 ppd  Vaping Use   Vaping Use: Never used  Substance and Sexual Activity   Alcohol use: No   Drug use: No   Sexual activity: Not on file  Other Topics Concern   Not on file  Social History Narrative   Not on file   Social Determinants of Health   Financial Resource Strain: Not on file  Food Insecurity: Not on file  Transportation Needs: No Transportation Needs   Lack of Transportation (Medical): No   Lack of Transportation (Non-Medical): No  Physical Activity: Not on file  Stress: Not on file  Social Connections: Not on file  Intimate Partner Violence: Not on file    Family History:    Family History  Problem Relation Age of Onset   Diabetes Mother    Diabetes Father    Diabetes Brother    Colon cancer Neg Hx    Esophageal cancer Neg Hx    Pancreatic cancer Neg Hx    Stomach cancer Neg Hx     Liver disease Neg Hx    CAD Neg Hx      ROS:  Please see the history of present illness.   All other ROS reviewed and negative.     Physical Exam/Data:   Vitals:   02/27/21 0615 02/27/21 0659 02/27/21 0700 02/27/21 0807  BP: (!) 186/114 (!) 181/113    Pulse: 83 87 87   Resp: '20 20 20   '$ Temp:      TempSrc:      SpO2: 100% 98% 100%   Weight:    136.1 kg   No intake or output data in the 24 hours ending 02/27/21 0940 Last 3 Weights 02/27/2021 02/11/2021 01/20/2021  Weight (lbs) 300 lb 174 lb 2.6 oz 175 lb  Weight (kg) 136.079 kg 79 kg 79.379 kg     Body mass index is 43.05 kg/m.  General: Well developed, well nourished AAM in no acute distress. Head: Normocephalic, atraumatic, sclera non-icteric, no xanthomas, nares are without discharge. Neck: Negative for carotid bruits. JVP elevated. Lungs: Diminished throughout with left basilar rales. No wheezing or rhonchi. Breathing is unlabored. Heart: RRR S1 S2 without murmurs, rubs, or gallops.  Abdomen: Soft, non-tender, non-distended with normoactive bowel sounds. No rebound/guarding. Marked scrotal edema, volleyball sized, purewick in place Extremities: No clubbing or cyanosis - s/p L BKA and R 2nd toe amputation, various scabs/excoriations on RLE, marked RLE Extremity edema Neuro: Alert and oriented X 3. Moves all extremities spontaneously. Psych:  Responds to questions appropriately with a normal affect.  EKG:  The EKG was personally reviewed and demonstrates:  NSR 82bpm, low voltage, nonspecific STTW changes, QTC 568m by EKG but hand calculated at 4668mTelemetry:  Telemetry was personally reviewed and demonstrates:  NSR (Reading as "Asystole" but this is artifactual not picking up his QRS complexes)  Relevant CV Studies: N/A  Laboratory Data:  High Sensitivity Troponin:  No results for input(s): TROPONINIHS in the last 720 hours.   Chemistry Recent Labs  Lab 02/27/21 0622  NA 141  K 3.8  CL 113*  CO2 22  GLUCOSE  225*  BUN 51*  CREATININE 2.64*  2.50*  CALCIUM 8.0*  GFRNONAA 29*  31*  ANIONGAP 6    Recent Labs  Lab 02/27/21 0622  PROT 6.5  ALBUMIN 2.0*  AST 26  ALT 32  ALKPHOS 344*  BILITOT 1.2   Hematology Recent Labs  Lab 02/27/21 0622  WBC 7.2  RBC 3.17*  HGB 9.1*  HCT 30.1*  MCV 95.0  MCH 28.7  MCHC 30.2  RDW 19.4*  PLT 218   BNP Recent Labs  Lab 02/27/21 0622  BNP >4,500.0*    DDimer No results for input(s): DDIMER in the last 168 hours.   Radiology/Studies:  DG Chest Port 1 View  Result Date: 02/27/2021 CLINICAL DATA:  50 year old male with shortness of breath and swollen extremities. EXAM: PORTABLE CHEST 1 VIEW COMPARISON:  Chest radiographs 02/17/2021 and earlier. FINDINGS: Portable AP semi upright view at 0605 hours. Lordotic positioning. Stable lung volumes and cardiomegaly. Increasing bilateral indistinct perihilar opacity and pulmonary vascularity. New or increased veiling opacity in both lower lungs greater on the right. No pneumothorax. Paucity of bowel gas in the upper abdomen. No acute osseous abnormality identified. IMPRESSION: Progressed pulmonary edema and bilateral pleural effusions greater on the right. Electronically Signed   By: Genevie Ann M.D.   On: 02/27/2021 06:34     Assessment and Plan:   1. Anasarca/massive volume overload suspect acute CHF superimposed on baseline renal insufficiency with hypoalbuminemia - given BNP, history and clinical presentation there is great concern for severe congestive heart failure - also confounding the issue is is poorly controlled HTN and advanced kidney dysfunction with possible component of nephrotic issue as evidenced by low albumin - await 2D Echo and if evidence of severe LV dysfunction, Dr. Radford Pax would suggest consideration of transfer to [facility] for advanced HF consultation (she will also be rounding this weekend) - agree with current IV Lasix dose (got 80 + '40mg'$  this AM, to continue '40mg'$  BID this  PM) - strict I/o's, daily weights  2. CKD stage IV  - d/w IM, suggest nephrology consultation to establish care, will need eval for nephrotic syndrome - follow   3. Uncontrolled HTN - continue carvedilol '25mg'$  BID - continue Imdur '30mg'$  daily - hold losartan while actively diuresing, await renal input as well - add hydralazine '25mg'$  TID and titrate as needed this stay - TSH wnl - order renal artery duplex - consider OP sleep study  4. Tobacco abuse - cessation will be important for recovery  5. Morbid obesity - consider OP sleep study  6. DM - per primary team  7. Chronic anemia - per primary team - Hg 9.1, slightly higher than previous values  Risk Assessment/Risk Scores:       New York Heart Association (NYHA) Functional Class NYHA Class IV at present time   For questions or updates, please contact [facility] Please consult [email or web address] for contact info under    Signed, Charlie Pitter, PA-C  02/27/2021 9:40 AM

## 2021-02-27 NOTE — Consult Note (Signed)
Renal Service Consult Note [facility]  Todd Mccoy 02/27/2021 Todd Blazing, MD Requesting Physician: Dr. Loann Quill  Reason for Consult: CKD pt w/ volume overload HPI: The patient is a 50 y.o. year-old w/ hx of HTN, DM, CKD 3-4. Was seen by renal service in June 2022 for AKI/ CKD, full serologic w/u done d/t microhematuria.  ANA, ANCA, GBM, dsDNA and complements and hepatitis B/C were all negative. 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  Creat was 3.1- 3.4 in June. He is now admitted for SOB and gen'd edema. CXR showed bilat effusions/ CHF.+scrotal sweling, orthopnea and PND.  Taking lasix at home. Admitted and given IV lasix, echo ordered and cardiology consulted. Asked to see for renal faiulre and vol overload.   Seen in room, denies seeing a kidney doctor, but his PCP has told him about kidney issues. Main issue is swelling all over the LE's and hip, stomach wall and scrotum since admitted in June here.   Pt lives in [place] w/ son, nephew, niece.  DM since 2008 w/ vision issues. On insulin 5-10 yrs.     ROS - denies CP, no joint pain, no HA, no blurry vision, no rash, no diarrhea, no nausea/ vomiting, no dysuria, no difficulty voiding   Past Medical History  Past Medical History:  Diagnosis Date   Anemia    Anemia    Anxiety    CKD (chronic kidney disease), stage IV ([place]) 12/10/2020   Diabetes mellitus with complication (HCC)    Diabetic neuropathy (HCC)    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[place]) 03/25/2020   pt states retinal eye damage- left worse than the right- recent visited MD    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[place]) 03/25/2020   pt states recent MD visit /left eye worse than right   Dyslipidemia 12/10/2020   Hx of BKA, left ([place])    Hypertension    Morbid obesity ([place])    Osteomyelitis ([place])    Pneumonia    Past Surgical History  Past Surgical History:  Procedure Laterality Date   ABSCESS  DRAINAGE     neck   AMPUTATION Left 11/14/2020   Procedure: LEFT 5TH RAY AMPUTATION;  Surgeon: Newt Minion, MD;  Location: [place];  Service: Orthopedics;  Laterality: Left;   AMPUTATION Left 12/10/2020   Procedure: LEFT BELOW KNEE AMPUTATION;  Surgeon: Newt Minion, MD;  Location: [place];  Service: Orthopedics;  Laterality: Left;   AMPUTATION TOE Right 03/25/2020   Procedure: AMPUTATION TOE;  Surgeon: Evelina Bucy, DPM;  [facility];  Service: Podiatry;  Laterality: Right;   INCISION AND DRAINAGE ABSCESS Right 09/07/2014   Procedure: INCISION AND DRAINAGE ABSCESS RIGHT FLANK;  Surgeon: Jackolyn Confer, MD;  Location[facility];  Service: General;  Laterality: Right;   LEG AMPUTATION BELOW KNEE Left 12/10/2020   SCROTUM EXPLORATION     Family History  Family History  Problem Relation Age of Onset   Diabetes Mother    Diabetes Father    Diabetes Brother    Colon cancer Neg Hx    Esophageal cancer Neg Hx    Pancreatic cancer Neg Hx    Stomach cancer Neg Hx    Liver disease Neg Hx    CAD Neg Hx    Social History  reports that he has been smoking cigarettes. He has a 32.00 pack-year smoking history. He has never used smokeless tobacco. He reports  that he does not drink alcohol and does not use drugs. Allergies  Allergies  Allergen Reactions   Amlodipine Swelling    BLE edema   Home medications Prior to Admission medications   Medication Sig Start Date End Date Taking? Authorizing Provider  acetaminophen (TYLENOL) 325 MG tablet Take 650 mg by mouth every 6 (six) hours as needed for mild pain.   Yes [provider]  atorvastatin (LIPITOR) 20 MG tablet Take 1 tablet (20 mg total) by mouth daily. 12/19/20  Yes Shahmehdi, Valeria Batman, MD  carvedilol (COREG) 25 MG tablet Take 1 tablet (25 mg total) by mouth 2 (two) times daily with a meal. 01/21/21  Yes [name], [name], MD  ferrous sulfate 325 (65 FE) MG tablet Take 1 tablet (325 mg total) by mouth 3 (three) times daily with  meals. Patient taking differently: Take 325 mg by mouth in the morning and at bedtime. 12/19/20 04/11/21 Yes Shahmehdi, Valeria Batman, MD  furosemide (LASIX) 40 MG tablet Take 1 tablet (40 mg total) by mouth daily. 02/25/21 03/27/21 Yes [name], MD  gabapentin (NEURONTIN) 300 MG capsule Take 1 capsule (300 mg total) by mouth 2 (two) times daily. 01/21/21  Yes [name], Charlane Ferretti, MD  insulin glargine (LANTUS) 100 UNIT/ML Solostar Pen Inject 20 Units into the skin at bedtime. 12/22/20 12/22/21 Yes Danford, Suann Larry, MD  isosorbide mononitrate (IMDUR) 30 MG 24 hr tablet Take 1 tablet (30 mg total) by mouth daily. 01/21/21  Yes [name], MD  losartan (COZAAR) 100 MG tablet Take 1 tablet (100 mg total) by mouth daily. 02/25/21  Yes [name], MD  Naphazoline HCl (CLEAR EYES OP) Place 1 drop into both eyes as needed (irritation).   Yes [provider]  oxyCODONE (OXY IR/ROXICODONE) 5 MG immediate release tablet Take 1 tablet (5 mg total) by mouth every 4 (four) hours as needed for moderate pain (pain score 4-6). 02/18/21  Yes Persons, Bevely Palmer, PA  polyethylene glycol powder (GLYCOLAX/MIRALAX) 17 GM/SCOOP powder Take 17 g by mouth 2 (two) times daily as needed. 01/21/21  Yes [name], [name], MD  Insulin Pen Needle 32G X 4 MM MISC USE TO INJECT INSULIN DAILY. 12/19/20 12/19/21  Shahmehdi, Valeria Batman, MD  insulin aspart protamine- aspart (NOVOLOG MIX 70/30) (70-30) 100 UNIT/ML injection Inject 0.35 mLs (35 Units total) into the skin 2 (two) times daily. 04/17/20 05/13/20  Argentina Donovan, PA-C  losartan-hydrochlorothiazide (HYZAAR) 100-25 MG tablet TAKE 1 TABLET BY MOUTH DAILY. 12/19/20 12/22/20  Shahmehdi, Valeria Batman, MD  metFORMIN (GLUCOPHAGE-XR) 500 MG 24 hr tablet TAKE 1 TABLET (500 MG TOTAL) BY MOUTH 2 (TWO) TIMES DAILY. 12/19/20 12/22/20  Deatra James, MD     Vitals:   02/27/21 1000 02/27/21 1150 02/27/21 1150 02/27/21 1152  BP: (!) 192/112 (!) 208/142    Pulse: 83 78    Resp: 20 20     Temp:   97.8 F (36.6 C)   TempSrc:   Oral   SpO2: 100% 99%    Weight:      Height:    _0  (1.753 m)   Exam Gen alert, no distress No rash, cyanosis or gangrene Sclera anicteric, throat clear  No jvd or bruits Chest dec'd bilat at bases, no rales/ wheezing RRR no MRG Abd soft ntnd no mass or ascites +bs GU normal MS no joint effusions or deformity Ext 3+ bilat pitting leg and hip edema, 2+ scrotal edema, 2-3+ abd and thoracic wall edema Neuro is alert, Ox 3 ,  nf, no asterixis         Home meds include lipitor, coreg bid, lasix 40 qd, lantus, neurontin 300 bid, losartan 100, imdur 30, oxy IR, prn's    CXR - IMPRESSION: Progressed pulmonary edema and bilateral pleural effusions greater on the right.    UA  8/26 - >300 prot, rare bact, 0-5 wbc, 21-50 rbc    UPC ratio 8/26 > 950/ 131 = 7.24    UNa, UCr     June 2022 - ANA, ANCA, antiGBM, dsDNA, hep B/C, complements negative         Date   Creat  eGFR    2015- 17  0.5- 0.7    2021   1.14- 1.35    April 2022  1.35    May 2022  1.54- 1.84 45- 55, IIIa    June 9- 20, 2022 3.15- 3.36 21- 24, IV    January 21, 2021 2.56  22,    Aug 10  2.44  32    Aug 16  2.53    Feb 27, 2021 2.64  29, IV       BP 180 - 225 / 115- 145, RA 98%   , HR 72  RR 20    Assessment/ Plan: CKD IV - progressive over the last 1-2 years. Proteinuria is worse here than earlier this year. 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 Alb 2.0. He has massive edema up to the scapulae bilat.  Tolerating surprisingly well. Will need extensive diuresis, may or may not end on needing dialysis as well.  Have d/w patient who agrees to proceed. Will start high dose IV lasix and give some IV albumin.  Will follow.  DM on insulin Vol overload - as above HTN - let's avoid ARB/ acei while diuresing, can use other agents.       Kelly Splinter  MD 02/27/2021, 1:41 PM  Recent Labs  Lab 02/27/21 0622  WBC 7.2  HGB 9.1*   Recent  Labs  Lab 02/27/21 0622  K 3.8  BUN 51*  CREATININE 2.64*  2.50*  CALCIUM 8.0*

## 2021-02-27 NOTE — H&P (Signed)
History and Physical    Todd Mccoy [id number] DOB: 08/22/1970 DOA: 02/27/2021  PCP: [name], MD  Patient coming from: Home  I have personally briefly reviewed patient's old medical records in [place]  Chief Complaint: "I cannot breath, I am swollen all over"  HPI: Todd Mccoy is a 50 y.o. male with medical history significant of hypertension, hyperlipidemia, CKD stage IIIb, diabetic neuropathy, retinopathy, left foot osteomyelitis status post BKA in June 2022, tobacco abuse, status post left second toe amputation, iron deficiency anemia, morbid obesity presents here with complaint of generalized swelling and shortness of breath for more than 1 month.  Patient reports that he is dealing with this issue since he had amputation of left leg June/22.  This is his third ED visit with the similar symptoms.  Has generalized swelling, including scrotal swelling, shortness of breath, dry cough, midsternal chest pain, associated with leg swelling, orthopnea and PND.  He takes Lasix and has been compliant with it.  Never diagnosed with CHF.  Never had transthoracic echo.  Does not see cardiology outpatient. Denies headache, blurry vision, lightheadedness, dizziness, fever, chills, sputum production, runny nose, sore throat, recent sick contact.  Reports that he is able to make urine.  His last bowel movement was yesterday.  Smokes half pack of cigarettes per day, denies alcohol or any illicit drug use.  Lives with his nephew.  Not compliant with low-sodium diet-usually eats from outside [employer] and [place])  ED Course: Upon arrival to ED: Temperature 97.5, pulse 82, RR: 18, blood pressure 225/176, maintaining oxygen saturation on room air.  BNP more than 4500, 5 0, BUN 51, GFR: 31, H&H: 9.1/30.1, COVID-19 negative.  Chest x-ray shows progressive pulmonary edema and bilateral pleural effusion greater on right side.  Patient was given Lasix 80 IV once in ED.  [facility]  hospitalist consulted for admission for anasarca in the setting of acute CHF  review of Systems: As per HPI otherwise negative.    Past Medical History:  Diagnosis Date   Anemia    Anxiety    Diabetes mellitus without complication ([place])    Diabetic neuropathy ([place])    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[place]) 03/25/2020   pt states retinal eye damage- left worse than the right- recent visited MD    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[place]) 03/25/2020   pt states recent MD visit /left eye worse than right   Dyslipidemia 12/10/2020   Hypertension    Pneumonia    Stage 3a chronic kidney disease ([place]) 12/10/2020    Past Surgical History:  Procedure Laterality Date   ABSCESS DRAINAGE     neck   AMPUTATION Left 11/14/2020   Procedure: LEFT 5TH RAY AMPUTATION;  Surgeon: Newt Minion, MD;  Location: [place];  Service: Orthopedics;  Laterality: Left;   AMPUTATION Left 12/10/2020   Procedure: LEFT BELOW KNEE AMPUTATION;  Surgeon: Newt Minion, MD;  Location: [place];  Service: Orthopedics;  Laterality: Left;   AMPUTATION TOE Right 03/25/2020   Procedure: AMPUTATION TOE;  Surgeon: Evelina Bucy, DPM;  [facility];  Service: Podiatry;  Laterality: Right;   INCISION AND DRAINAGE ABSCESS Right 09/07/2014   Procedure: INCISION AND DRAINAGE ABSCESS RIGHT FLANK;  Surgeon: Jackolyn Confer, MD;  [facility];  Service: General;  Laterality: Right;   LEG AMPUTATION BELOW KNEE Left 12/10/2020   SCROTUM EXPLORATION       reports that he has been smoking cigarettes. He has  a 32.00 pack-year smoking history. He has never used smokeless tobacco. He reports that he does not drink alcohol and does not use drugs.  Allergies  Allergen Reactions   Amlodipine Swelling    BLE edema    Family History  Problem Relation Age of Onset   Diabetes Mother    Diabetes Father    Diabetes Brother    Colon cancer Neg Hx    Esophageal cancer Neg Hx    Pancreatic cancer Neg Hx    Stomach cancer Neg Hx     Liver disease Neg Hx     Prior to Admission medications   Medication Sig Start Date End Date Taking? Authorizing Provider  acetaminophen (TYLENOL) 325 MG tablet Take 650 mg by mouth every 6 (six) hours as needed for mild pain.    [provider]  atorvastatin (LIPITOR) 20 MG tablet Take 1 tablet (20 mg total) by mouth daily. 12/19/20   Shahmehdi, Valeria Batman, MD  carvedilol (COREG) 25 MG tablet Take 1 tablet (25 mg total) by mouth 2 (two) times daily with a meal. 01/21/21   [name], MD  ferrous sulfate 325 (65 FE) MG tablet Take 1 tablet (325 mg total) by mouth 3 (three) times daily with meals. 12/19/20 04/11/21  Shahmehdi, Valeria Batman, MD  furosemide (LASIX) 40 MG tablet Take 1 tablet (40 mg total) by mouth daily. 02/25/21 03/27/21  [name], MD  gabapentin (NEURONTIN) 300 MG capsule Take 1 capsule (300 mg total) by mouth 2 (two) times daily. 01/21/21   [name], MD  insulin glargine (LANTUS) 100 UNIT/ML Solostar Pen Inject 20 Units into the skin at bedtime. 12/22/20 12/22/21  Danford, Suann Larry, MD  Insulin Pen Needle 32G X 4 MM MISC USE TO INJECT INSULIN DAILY. 12/19/20 12/19/21  Deatra James, MD  isosorbide mononitrate (IMDUR) 30 MG 24 hr tablet Take 1 tablet (30 mg total) by mouth daily. 01/21/21   [name], MD  losartan (COZAAR) 100 MG tablet Take 1 tablet (100 mg total) by mouth daily. 02/25/21   [name], MD  Naphazoline HCl (CLEAR EYES OP) Place 1 drop into both eyes as needed (irritation).    [provider]  oxyCODONE (OXY IR/ROXICODONE) 5 MG immediate release tablet Take 1 tablet (5 mg total) by mouth every 4 (four) hours as needed for moderate pain (pain score 4-6). 02/18/21   Persons, Bevely Palmer, PA  polyethylene glycol powder (GLYCOLAX/MIRALAX) 17 GM/SCOOP powder Take 17 g by mouth 2 (two) times daily as needed. 01/21/21   [name], MD  insulin aspart protamine- aspart (NOVOLOG MIX 70/30) (70-30) 100 UNIT/ML injection Inject 0.35 mLs (35  Units total) into the skin 2 (two) times daily. 04/17/20 05/13/20  Argentina Donovan, PA-C  losartan-hydrochlorothiazide (HYZAAR) 100-25 MG tablet TAKE 1 TABLET BY MOUTH DAILY. 12/19/20 12/22/20  Shahmehdi, Valeria Batman, MD  metFORMIN (GLUCOPHAGE-XR) 500 MG 24 hr tablet TAKE 1 TABLET (500 MG TOTAL) BY MOUTH 2 (TWO) TIMES DAILY. 12/19/20 12/22/20  Deatra James, MD    Physical Exam: Vitals:   02/27/21 0600 02/27/21 0615 02/27/21 0659 02/27/21 0700  BP: (!) 182/104 (!) 186/114 (!) 181/113   Pulse: 84 83 87 87  Resp: '19 20 20 20  '$ Temp:      TempSrc:      SpO2: 100% 100% 98% 100%    Constitutional: NAD, calm, obese, appears sick, weak, lethargic, swollen all over Eyes: PERRL, lids and conjunctivae normal ENMT: Mucous membranes are moist. Posterior pharynx clear  of any exudate or lesions.Normal dentition.  Neck: normal, supple, no masses, no thyromegaly Respiratory: Decreased breath sound noted on the basis  cardiovascular: Regular rate and rhythm, no murmurs / rubs / gallops.  Abdomen: Abdomen is distended, firm, bowel sounds sluggish.    Musculoskeletal:     Neurologic: CN 2-12 grossly intact. Sensation intact, DTR normal. Strength 5/5 in all 4.  Psychiatric: Normal judgment and insight. Alert and oriented x 3. Normal mood.    Labs on Admission: I have personally reviewed following labs and imaging studies  CBC: Recent Labs  Lab 02/27/21 0622  WBC 7.2  NEUTROABS 5.6  HGB 9.1*  HCT 30.1*  MCV 95.0  PLT 99991111   Basic Metabolic Panel: Recent Labs  Lab 02/27/21 0622  NA 141  K 3.8  CL 113*  CO2 22  GLUCOSE 225*  BUN 51*  CREATININE 2.50*  CALCIUM 8.0*   GFR: CrCl cannot be calculated (Unknown ideal weight.). Liver Function Tests: Recent Labs  Lab 02/27/21 0622  AST 26  ALT 32  ALKPHOS 344*  BILITOT 1.2  PROT 6.5  ALBUMIN 2.0*   No results for input(s): LIPASE, AMYLASE in the last 168 hours. No results for input(s): AMMONIA in the last 168 hours. Coagulation  Profile: No results for input(s): INR, PROTIME in the last 168 hours. Cardiac Enzymes: No results for input(s): CKTOTAL, CKMB, CKMBINDEX, TROPONINI in the last 168 hours. BNP (last 3 results) No results for input(s): PROBNP in the last 8760 hours. HbA1C: Recent Labs    02/25/21 1441  HGBA1C 7.1*   CBG: No results for input(s): GLUCAP in the last 168 hours. Lipid Profile: No results for input(s): CHOL, HDL, LDLCALC, TRIG, CHOLHDL, LDLDIRECT in the last 72 hours. Thyroid Function Tests: No results for input(s): TSH, T4TOTAL, FREET4, T3FREE, THYROIDAB in the last 72 hours. Anemia Panel: No results for input(s): VITAMINB12, FOLATE, FERRITIN, TIBC, IRON, RETICCTPCT in the last 72 hours. Urine analysis:    Component Value Date/Time   COLORURINE YELLOW 12/18/2020 1145   APPEARANCEUR HAZY (A) 12/18/2020 1145   LABSPEC 1.014 12/18/2020 1145   PHURINE 5.0 12/18/2020 1145   GLUCOSEU NEGATIVE 12/18/2020 1145   HGBUR MODERATE (A) 12/18/2020 1145   BILIRUBINUR NEGATIVE 12/18/2020 1145   KETONESUR NEGATIVE 12/18/2020 1145   PROTEINUR 100 (A) 12/18/2020 1145   UROBILINOGEN 1.0 09/06/2014 2307   NITRITE NEGATIVE 12/18/2020 1145   LEUKOCYTESUR SMALL (A) 12/18/2020 1145    Radiological Exams on Admission: DG Chest Port 1 View  Result Date: 02/27/2021 CLINICAL DATA:  50 year old male with shortness of breath and swollen extremities. EXAM: PORTABLE CHEST 1 VIEW COMPARISON:  Chest radiographs 02/17/2021 and earlier. FINDINGS: Portable AP semi upright view at 0605 hours. Lordotic positioning. Stable lung volumes and cardiomegaly. Increasing bilateral indistinct perihilar opacity and pulmonary vascularity. New or increased veiling opacity in both lower lungs greater on the right. No pneumothorax. Paucity of bowel gas in the upper abdomen. No acute osseous abnormality identified. IMPRESSION: Progressed pulmonary edema and bilateral pleural effusions greater on the right. Electronically Signed   By: Genevie Ann M.D.   On: 02/27/2021 06:34    EKG: Independently reviewed.  Sinus rhythm, low voltage, right axis deviation, prolonged QT interval  Assessment/Plan Principal Problem:   Acute CHF (congestive heart failure) (HCC) Active Problems:   DM2 (diabetes mellitus, type 2) (HCC)   Tobacco abuse   HTN (hypertension)   S/P BKA (below knee amputation) unilateral, left (HCC)   Dyslipidemia   CKD (chronic kidney  disease), stage III ([place])   Normocytic anemia    Anasarca in the setting of acute CHF (unknown type): -VS nephrotic syndrome? -Patient presented with generalized body swelling. -Chest x-ray shows progressing pulmonary edema and bilateral pleural effusion greater on the right.  BNP: More than 4500.  COVID-19 negative -Patient received Lasix 80 IV once in the ED -Admit patient under telemetry. -Start Lasix 40 IV twice daily. -Strict INO's and daily weight.  Monitor kidney function closely.  Check TSH, UDSand magnesium level. -Keep potassium more than 4, magnesium more than 2 -Order transthoracic echo [facility] cardiology & nephrology  Accelerated hypertension: -Blood pressure 225/176 upon arrival, improved to 181/113 after Lasix. -Unsure about patient's compliance. -We will resume patient's home medications -Hydralazine as needed for blood pressure above 160/100 -Monitor blood pressure closely  Uncontrolled type 2 diabetes mellitus: Last A1c 8.6%.  We will repeat A1c today.  Start patient on sliding scale insulin.  Monitor blood sugar closely  Iron deficiency anemia: -H&H is stable.  Continue ferrous sulfate  Diabetic neuropathy: Continue gabapentin  Hyperlipidemia: Continue statin  CKD stage IIIb-IV:  Hypoalbuminemia -concern for nephrotic syndrome? Will consult nephrology -check urine protein/cr. ratio -monitor renal function closely  History of left fifth toe osteomyelitis s/p left BKA: S/p right second toe amputation -Aware  Prolonged QT interval: -Monitor  closely on telemetry.  Check electrolytes.  Avoid medications that prolong QT interval.  Skin ulcers: -Multiple superficial skin ulcers noted on bilateral lower extremities  -wound care  Tobacco abuse: Counseled about cessation  Morbid obesity: Diet modification, exercise, weight loss highly encouraged  Physical deconditioning: -In the setting of chronic medical issues -Consult PT/OT.   DVT prophylaxis: Heparin Code Status: Full code Family Communication: Patient's nephew present at bedside.  Plan of care discussed with patient in length and he verbalized understanding and agreed with it. Disposition Plan: To be determined Consults called: Cardiology & nephrology Admission status: Inpatient   Mckinley Jewel MD [facility]  If 7PM-7AM, please contact night-coverage [email or web address]  02/27/2021, 7:54 AM

## 2021-02-28 ENCOUNTER — Inpatient Hospital Stay (HOSPITAL_COMMUNITY): Payer: Medicaid Other

## 2021-02-28 DIAGNOSIS — N179 Acute kidney failure, unspecified: Secondary | ICD-10-CM | POA: Diagnosis not present

## 2021-02-28 DIAGNOSIS — I10 Essential (primary) hypertension: Secondary | ICD-10-CM

## 2021-02-28 DIAGNOSIS — I272 Pulmonary hypertension, unspecified: Secondary | ICD-10-CM

## 2021-02-28 DIAGNOSIS — I160 Hypertensive urgency: Secondary | ICD-10-CM

## 2021-02-28 DIAGNOSIS — R601 Generalized edema: Secondary | ICD-10-CM | POA: Diagnosis not present

## 2021-02-28 DIAGNOSIS — N184 Chronic kidney disease, stage 4 (severe): Secondary | ICD-10-CM

## 2021-02-28 DIAGNOSIS — I5041 Acute combined systolic (congestive) and diastolic (congestive) heart failure: Secondary | ICD-10-CM | POA: Diagnosis not present

## 2021-02-28 LAB — CBC
HCT: 27.9 % — ABNORMAL LOW (ref 39.0–52.0)
Hemoglobin: 8.7 g/dL — ABNORMAL LOW (ref 13.0–17.0)
MCH: 28.2 pg (ref 26.0–34.0)
MCHC: 31.2 g/dL (ref 30.0–36.0)
MCV: 90.6 fL (ref 80.0–100.0)
Platelets: 229 10*3/uL (ref 150–400)
RBC: 3.08 MIL/uL — ABNORMAL LOW (ref 4.22–5.81)
RDW: 18.7 % — ABNORMAL HIGH (ref 11.5–15.5)
WBC: 7 10*3/uL (ref 4.0–10.5)
nRBC: 0 % (ref 0.0–0.2)

## 2021-02-28 LAB — BASIC METABOLIC PANEL
Anion gap: 8 (ref 5–15)
BUN: 54 mg/dL — ABNORMAL HIGH (ref 6–20)
CO2: 21 mmol/L — ABNORMAL LOW (ref 22–32)
Calcium: 8.4 mg/dL — ABNORMAL LOW (ref 8.9–10.3)
Chloride: 117 mmol/L — ABNORMAL HIGH (ref 98–111)
Creatinine, Ser: 2.48 mg/dL — ABNORMAL HIGH (ref 0.61–1.24)
GFR, Estimated: 31 mL/min — ABNORMAL LOW (ref >60)
Glucose, Bld: 146 mg/dL — ABNORMAL HIGH (ref 70–99)
Potassium: 4 mmol/L (ref 3.5–5.1)
Sodium: 146 mmol/L — ABNORMAL HIGH (ref 135–145)

## 2021-02-28 LAB — BLOOD GAS, ARTERIAL
Acid-base deficit: 2.6 mmol/L — ABNORMAL HIGH (ref 0.0–2.0)
Bicarbonate: 21.2 mmol/L (ref 20.0–28.0)
FIO2: 21
O2 Saturation: 96.4 %
Patient temperature: 98.6
pCO2 arterial: 34.9 mmHg (ref 32.0–48.0)
pH, Arterial: 7.401 (ref 7.350–7.450)
pO2, Arterial: 83.8 mmHg (ref 83.0–108.0)

## 2021-02-28 LAB — GLUCOSE, CAPILLARY
Glucose-Capillary: 143 mg/dL — ABNORMAL HIGH (ref 70–99)
Glucose-Capillary: 178 mg/dL — ABNORMAL HIGH (ref 70–99)
Glucose-Capillary: 142 mg/dL — ABNORMAL HIGH (ref 70–99)

## 2021-02-28 LAB — MAGNESIUM: Magnesium: 1.9 mg/dL (ref 1.7–2.4)

## 2021-02-28 MED ORDER — FUROSEMIDE 10 MG/ML IJ SOLN
120.0000 mg | Freq: Three times a day (TID) | INTRAVENOUS | Status: DC
  Administered 2021-02-28 – 2021-03-05 (×14): 120 mg via INTRAVENOUS
  Filled 2021-02-28 (×6): qty 10
  Filled 2021-02-28: qty 12
  Filled 2021-02-28 (×4): qty 10
  Filled 2021-02-28: qty 2
  Filled 2021-02-28: qty 12
  Filled 2021-02-28 (×2): qty 10

## 2021-02-28 MED ORDER — CLONIDINE HCL 0.1 MG PO TABS
0.1000 mg | ORAL_TABLET | Freq: Once | ORAL | Status: AC
  Administered 2021-02-28: 0.1 mg via ORAL
  Filled 2021-02-28: qty 1

## 2021-02-28 MED ORDER — HYDRALAZINE HCL 50 MG PO TABS
50.0000 mg | ORAL_TABLET | Freq: Four times a day (QID) | ORAL | Status: DC
  Administered 2021-02-28 – 2021-03-01 (×4): 50 mg via ORAL
  Filled 2021-02-28 (×4): qty 1

## 2021-02-28 MED ORDER — METOLAZONE 5 MG PO TABS
5.0000 mg | ORAL_TABLET | Freq: Two times a day (BID) | ORAL | Status: DC
  Administered 2021-02-28 – 2021-03-06 (×11): 5 mg via ORAL
  Filled 2021-02-28 (×13): qty 1

## 2021-02-28 NOTE — Progress Notes (Addendum)
[facility] Progress Note  Subjective: seen in room, 1000 cc UOP overnight  Vitals:   02/28/21 0624 02/28/21 0647 02/28/21 1041 02/28/21 1347  BP: (!) 185/109 (!) 157/96 (!) 152/92 (!) 173/107  Pulse: 82 80 72 76  Resp: $Remo'18 18 20 20  'hPFVh$ Temp: 98.2 F (36.8 C) 98.1 F (36.7 C) 97.8 F (36.6 C) 98.3 F (36.8 C)  TempSrc: Oral Oral Oral Oral  SpO2: 98% 100% 96% 100%  Weight:      Height:        Exam:  alert, nad   no jvd  Chest dec'd bilat at bases, no rales/ wheezing  RRR no MRG  Abd soft ntnd no mass or ascites +bs Ext 2- 3+ bilat pitting leg/hip / scrotal/ abd and thoracic wall edema Neuro is alert, Ox 3 , nf, no asterixis       Home meds include lipitor, coreg bid, lasix 40 qd, lantus, neurontin 300 bid, losartan 100, imdur 30, oxy IR, prn's     CXR - IMPRESSION: Progressed pulmonary edema and bilateral pleural effusions greater on the right.    UA  8/26 - >300 prot, rare bact, 0-5 wbc, 21-50 rbc    UPC ratio 8/26 > 950/ 131 = 7.24    UNa, UCr     June 2022 - ANA, ANCA, antiGBM, dsDNA, hep B/C, complements negative         Date                         Creat               eGFR    2015- 17                   0.5- 0.7    2021                         1.14- 1.35    April 2022                 1.35    May 2022                 1.54- 1.84        45- 55, IIIa    June 9- 20, 2022      3.15- 3.36        21- 24, IV    January 21, 2021           2.56                 22,    Aug 10                      2.44                 32    Aug 16                      2.53    Feb 27, 2021           2.64                 29, IV       BP 180 - 225 / 115- 145, RA 98%   , HR 72  RR 20       Assessment/ Plan: CKD IV - progressive over the last 1-2 years. Proteinuria is  worse here than earlier this year. 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 Alb 2.0. He has massive edema up to the scapulae bilat. Started IV lasix, will ^ to 120 mg q8 hrs, and add  po zaroxolyn 5 bid.  DM on insulin Vol overload - massive volume overload, anticipate significant time in hospital HTN - let's avoid ARB/ acei while diuresing, can use other agents.          Rob Doctor, hospital 02/28/2021, 2:43 PM   Recent Labs  Lab 02/27/21 0622 02/28/21 0536  K 3.8 4.0  BUN 51* 54*  CREATININE 2.64*  2.50* 2.48*  CALCIUM 8.0* 8.4*  HGB 9.1* 8.7*   Inpatient medications:  atorvastatin  20 mg Oral Daily   carvedilol  25 mg Oral BID WC   ferrous sulfate  325 mg Oral TID WC   gabapentin  300 mg Oral BID   heparin  5,000 Units Subcutaneous Q8H   hydrALAZINE  50 mg Oral Q6H   insulin aspart  0-15 Units Subcutaneous TID WC   insulin aspart  0-5 Units Subcutaneous QHS   isosorbide mononitrate  30 mg Oral Daily   potassium chloride  10 mEq Oral BID   sodium chloride flush  3 mL Intravenous Q12H    sodium chloride     albumin human 12.5 g (02/28/21 1351)   furosemide 100 mg (02/28/21 0826)   sodium chloride, acetaminophen, hydrALAZINE, ondansetron (ZOFRAN) IV, oxyCODONE, polyvinyl alcohol, sodium chloride flush

## 2021-02-28 NOTE — Evaluation (Signed)
Occupational Therapy Evaluation Patient Details Name: Todd Mccoy MRN: [id number] DOB: 08/29/1970 Today's Date: 02/28/2021    History of Present Illness Patient is a 50 year old male presenting with diarrhea, swelling, shortness of breath for more than a month with multiple ED visits. Pt diagnosed with progressive pulmonary edema and bilateral pleural effusion, Acute combined systolic/diastolic CHF superimposed on baseline renal insufficiency and hypoalbuminemia, anascarsa. 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 Patient reports since his left lower extremity amputation he was been able to transfer himself from wheelchair level and perform basic self care tasks without assist. Does have family support 24/7, mom is retired. Currently patient impacted by volume overload causing increased work of breathing with bed mobility, painful scrotum due to significant swelling limiting his overall activity tolerance. Patient needing min A for bed mobility however limited sitting balance heavily reliant on at least unilateral upper extremity support and was unable to rest arm long enough to obtain BP. In semi-supine HR 78 BP 171/111, sitting HR 84. Anticipate needing two assist to attempt transfers when patient able to tolerate and BP more stable. Acute OT to follow, would currently recommend short term rehab however patient has declined in the past and wanting to go home therefore would recommend [facility] follow up.   Patient also stating that his current bedside commode and wheelchair are too narrow needing bariatric commode and wider wheelchair.     Follow Up Recommendations  SNF;Home health OT;Supervision/Assistance - 24 hour (pending progress, patient has declined SNF rehab in past.)    Equipment Recommendations  Other (comment) (patient needs bariatric 3 in 1 and  larger wheelchair)           Precautions / Restrictions Precautions Precautions: Fall Precaution Comments: monitor vitals, swollen scrotum, does not yet have prosthesis for L LE Restrictions Weight Bearing Restrictions: No      Mobility Bed Mobility Overal bed mobility: Needs Assistance Bed Mobility: Supine to Sit;Sit to Supine     Supine to sit: Min assist;HOB elevated Sit to supine: Min assist   General bed mobility comments: Min A to manage R leg, with head of bed elevated and heavy use of bed rails able to upright trunk. Needed increased time and rest breaks sidelying on forearm before coming fully upright. Patient needing significant time and cues to use R LE and bilateral arms on bed rails to scoot himself up the head of bed once supine.    Transfers                 General transfer comment: Deferred, limited sitting tolerance due to scrotal pain and increased work of breathing    Balance Overall balance assessment: Needs assistance Sitting-balance support: Bilateral upper extremity supported;Feet supported Sitting balance-Leahy Scale: Poor Sitting balance - Comments: Heavily reliant on at least unilateral upper extremity support to sit upright for ~3 minutes                                   ADL either performed or assessed with clinical judgement   ADL Overall ADL's : Needs assistance/impaired Eating/Feeding: Set up;Bed level   Grooming: Set up;Supervision/safety;Bed level   Upper Body Bathing: Minimal assistance;Bed level   Lower Body Bathing: Moderate assistance;Bed level   Upper Body Dressing :  Minimal assistance;Sitting;Bed level   Lower Body Dressing: Maximal assistance;Sitting/lateral leans;Bed level     Toilet Transfer Details (indicate cue type and reason): deferred, difficulty tolerating sitting edge of bed due to labored breathing and painful scrotum Toileting- Clothing Manipulation and Hygiene: Total assistance;Bed  level Toileting - Clothing Manipulation Details (indicate cue type and reason): utilize male external catheter to void       General ADL Comments: patient requiring increased assistance with self care tasks due to impaired activity tolerance, sitting balance, pain and labored breathing     Vision Baseline Vision/History: 5 Retinopathy Ability to See in Adequate Light: 4 Severely impaired (left eye) Patient Visual Report: No change from baseline              Pertinent Vitals/Pain Pain Assessment: Faces Faces Pain Scale: Hurts even more Pain Location: scrotum with bed mobility Pain Descriptors / Indicators: Grimacing Pain Intervention(s): Monitored during session;Limited activity within patient's tolerance     Hand Dominance Left   Extremity/Trunk Assessment Upper Extremity Assessment Upper Extremity Assessment: Overall WFL for tasks assessed (does have neuropathy effecting sensation, strength and AROM grossly intact bilaterally)   Lower Extremity Assessment Lower Extremity Assessment: Defer to PT evaluation       Communication Communication Communication: No difficulties   Cognition Arousal/Alertness: Awake/alert Behavior During Therapy: WFL for tasks assessed/performed Overall Cognitive Status: Within Functional Limits for tasks assessed                                     General Comments  HR 78 BP 171/111 in bed. HR at edge of bed 84, patient unable to keep arm relaxed for seated blood pressure            Home Living Family/patient expects to be discharged to:: Private residence Living Arrangements: Parent;Children;Other relatives (mom, niece, son) Available Help at Discharge: Family;Available 24 hours/day Type of Home: House Home Access: Level entry     Home Layout: Multi-level;Able to live on main level with bedroom/bathroom     Bathroom Shower/Tub: Teacher, early years/pre: Standard Bathroom Accessibility: Yes How  Accessible: Accessible via wheelchair Home Equipment: Crutches;Walker - 4 wheels;Cane - single point;Walker - 2 wheels;Tub bench;Wheelchair - manual;Bedside commode (3 in 1 and wheelchair are too small)          Prior Functioning/Environment Level of Independence: Independent with assistive device(s)        Comments: reports since amputation in June has been mod I with self care tasks from wheelchair level        OT Problem List: Decreased activity tolerance;Impaired balance (sitting and/or standing);Decreased safety awareness;Obesity;Pain      OT Treatment/Interventions: Self-care/ADL training;Balance training;Patient/family education;Therapeutic activities    OT Goals(Current goals can be found in the care plan section) Acute Rehab OT Goals Patient Stated Goal: get out of here, get some fresh air OT Goal Formulation: With patient Time For Goal Achievement: 03/14/21 Potential to Achieve Goals: Fair  OT Frequency: Min 2X/week    AM-PAC OT "6 Clicks" Daily Activity     Outcome Measure Help from another person eating meals?: A Little Help from another person taking care of personal grooming?: A Little Help from another person toileting, which includes using toliet, bedpan, or urinal?: Total Help from another person bathing (including washing, rinsing, drying)?: A Lot Help from another person to put on and taking off regular upper body clothing?: A Little Help  from another person to put on and taking off regular lower body clothing?: A Lot 6 Click Score: 14   End of Session Nurse Communication: Mobility status  Activity Tolerance: Patient limited by pain;Patient limited by fatigue Patient left: in bed;with call bell/phone within reach;with bed alarm set  OT Visit Diagnosis: Other abnormalities of gait and mobility (R26.89);Pain Pain - part of body:  (scrotum)                Time: [id number] OT Time Calculation (min): 29 min Charges:  OT General Charges $OT Visit: 1  Visit OT Evaluation $OT Eval Moderate Complexity: 1 Mod OT Treatments $Self Care/Home Management : 8-22 mins  Delbert Phenix OT OT pager: Soham 02/28/2021, 1:06 PM

## 2021-02-28 NOTE — Evaluation (Signed)
Physical Therapy Evaluation Patient Details Name: Todd Mccoy MRN: [id number] DOB: 1970/09/12 Today's Date: 02/28/2021   History of Present Illness  Patient is a 50 year old male presenting with diarrhea, swelling, shortness of breath for more than a month with multiple ED visits. Pt diagnosed with progressive pulmonary edema and bilateral pleural effusion, Acute combined systolic/diastolic CHF superimposed on baseline renal insufficiency and hypoalbuminemia, anascarsa. PMH includes L BKA 12/2020, DM, neuropathy, retinal damage L>R, HTN  Clinical Impression  Bed level eval only. Pt declined EOB/OOB mobility this session (he did report mobilizing to EOB with OT earlier). He c/o pain limiting mobility. He is also tearful about current situation (quality of life, responsibilities). Encouraged him to work with both PT and OT when we visit so he does not get weaker during this hospital stay. Will plan to follow and progress activity as pt will allow.     Follow Up Recommendations SNF (HHPT;24 hour supervision/assist likely due to pt reporting he cannot afford SNF placement)    Equipment Recommendations   (pt reports wc and bsc are too small. may need home assessment to determine if wider WC/BSC can be accommodated in home.)    Recommendations for Other Services       Precautions / Restrictions Precautions Precautions: Fall Precaution Comments: monitor vitals, swollen scrotum, does not yet have prosthesis for L LE Restrictions Weight Bearing Restrictions: No      Mobility  Bed Mobility Overal bed mobility: Needs Assistance Bed Mobility: Supine to Sit;Sit to Supine     Supine to sit: Min assist;HOB elevated Sit to supine: Min assist   General bed mobility comments: NT-pt declined OOB on today despite encouragement and explaining reason for consults. He did report mobilizing to EOB with OT earlier today.    Transfers                 General transfer comment: Deferred,  limited sitting tolerance due to scrotal pain and increased work of breathing  Ambulation/Gait                Stairs            Wheelchair Mobility    Modified Rankin (Stroke Patients Only)       Balance Overall balance assessment: Needs assistance Sitting-balance support: Bilateral upper extremity supported;Feet supported Sitting balance-Leahy Scale: Poor Sitting balance - Comments: Heavily reliant on at least unilateral upper extremity support to sit upright for ~3 minutes                                     Pertinent Vitals/Pain Pain Assessment: Faces Faces Pain Scale: Hurts little more Pain Location: scrotum Pain Descriptors / Indicators: Discomfort;Sore Pain Intervention(s): Limited activity within patient's tolerance    Home Living Family/patient expects to be discharged to:: Private residence Living Arrangements: Parent;Children;Other relatives Available Help at Discharge: Family;Available 24 hours/day Type of Home: House Home Access: Ramped entrance     Home Layout: Able to live on main level with bedroom/bathroom Home Equipment: Crutches;Walker - 4 wheels;Cane - single point;Walker - 2 wheels;Tub bench;Wheelchair - manual;Bedside commode Additional Comments: lives with son, niece, Mom who is retired.    Prior Function Level of Independence: Independent with assistive device(s)   Gait / Transfers Assistance Needed: able to perform scoot transfer to/from [facility] with Mod Ind.     Comments: reports since amputation in June has been mod I with self  care tasks from wheelchair level     Hand Dominance   Dominant Hand: Left    Extremity/Trunk Assessment   Upper Extremity Assessment Upper Extremity Assessment: Defer to OT evaluation    Lower Extremity Assessment Lower Extremity Assessment: RLE deficits/detail;LLE deficits/detail RLE Sensation: history of peripheral neuropathy LLE Deficits / Details: BKA LLE Sensation: history of  peripheral neuropathy       Communication   Communication: No difficulties  Cognition Arousal/Alertness: Awake/alert Behavior During Therapy: WFL for tasks assessed/performed Overall Cognitive Status: Within Functional Limits for tasks assessed                                        General Comments General comments (skin integrity, edema, etc.): HR 78 BP 171/111 in bed. HR at edge of bed 84, patient unable to keep arm relaxed for seated blood pressure    Exercises     Assessment/Plan    PT Assessment Patient needs continued PT services  PT Problem List Decreased strength;Decreased mobility;Decreased range of motion;Decreased activity tolerance;Decreased balance;Decreased knowledge of use of DME;Pain;Decreased skin integrity       PT Treatment Interventions DME instruction;Gait training;Therapeutic exercise;Functional mobility training;Therapeutic activities;Patient/family education;Balance training    PT Goals (Current goals can be found in the Care Plan section)  Acute Rehab PT Goals Patient Stated Goal: get out of here, get some fresh air PT Goal Formulation: With patient Time For Goal Achievement: 03/14/21 Potential to Achieve Goals: Fair    Frequency Min 2X/week   Barriers to discharge        Co-evaluation               AM-PAC PT "6 Clicks" Mobility  Outcome Measure Help needed turning from your back to your side while in a flat bed without using bedrails?: A Lot Help needed moving from lying on your back to sitting on the side of a flat bed without using bedrails?: A Lot Help needed moving to and from a bed to a chair (including a wheelchair)?: Total Help needed standing up from a chair using your arms (e.g., wheelchair or bedside chair)?: Total Help needed to walk in hospital room?: Total Help needed climbing 3-5 steps with a railing? : Total 6 Click Score: 8    End of Session   Activity Tolerance: Patient limited by pain Patient  left: in bed;with call bell/phone within reach;with bed alarm set   PT Visit Diagnosis: Pain;Muscle weakness (generalized) (M62.81);Other abnormalities of gait and mobility (R26.89)    Time: [id number] PT Time Calculation (min) (ACUTE ONLY): 14 min   Charges:   PT Evaluation $PT Eval Moderate Complexity: 1 Mod             Doreatha Massed, PT Acute Rehabilitation  Office: [phone] Pager: [phone]

## 2021-02-28 NOTE — Progress Notes (Signed)
Scabbed areas to right lower extremity has minimal drainage. Areas cleansed and xeroform gauze applied and extremity wrapped with kerlix. Leela Vanbrocklin, Laurel Dimmer, RN

## 2021-02-28 NOTE — Progress Notes (Signed)
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 In ED: "Temperature 97.5, pulse 82, RR: 18, blood pressure 225/176, maintaining oxygen saturation on room air.  BNP more than 4500, 5 0, BUN 51, GFR: 31, H&H: 9.1/30.1, COVID-19 negative.  Chest x-ray shows progressive pulmonary edema and bilateral pleural effusion greater on right side.  Patient was given Lasix 80 IV once in ED.ther evaluation." Cardiology, nephrology consulted  Subjective:  Seen this am He is little sleepy interactive Breathing better some On RA Lt BKA  Assessment & Plan:  Shortness of breath, leg swelling/Anasarca, progressively worsening x 1 month Volume overload Acute combined systolic/diastolic CHF superimposed on baseline renal insufficiency and hypoalbuminemia  Echo showed EF of 40%, grade 3 DD, moderate to severe left ventricular hypertrophy. 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  Cardiology following will likely need ischemic work-up but had AKI on CKD.  Continue aggressive diuresis with IV Lasix, albumin, monitor intake output Daily weight, monitor renal function. Diuretics per nephro.  Weight is improving as below Net IO Since Admission: 380 mL [02/28/21 1000]  Filed Weights   02/27/21 0807 02/28/21 0500  Weight: 136.1 kg 127.3 kg    Accelerated hypertension, bp  225/176 in ED. on Coreg, hydralazine and Imdur.  Status post clonidine x1 this am.  Remains poorly controlled continue to monitor and adjust  DM2 with neuropathy/retinopathy, HbA1c 8.6, on sliding scale here.  Continue Recent Labs  Lab 02/27/21 0819 02/27/21 1202 02/27/21 1706 02/27/21 2200 02/28/21 0751  GLUCAP 225* 204* 156* 156* 143*    CKD IV: Creatinine stable 2.4, nephrology on board Recent Labs    12/19/20 0123 12/19/20 1639 12/20/20 0609 12/21/20 0034 12/22/20 0129 01/21/21 1438 02/11/21 1740 02/17/21 0600 02/27/21 0622 02/28/21 0536  BUN 115* 120* 120* 120* 117* 42* 44* 44* 51* 54*  CREATININE 3.41* 3.37* 3.26* 3.32* 3.26* 2.56* 2.44* 2.53* 2.64*  2.50* 2.48*    Normocytic anemia-stable, monitor. Recent Labs  Lab 02/27/21 0622  HGB 9.1*  HCT 30.1*   Somnolence at at times:since admission checked ABG no acute finding.  Monitor.  Tobacco abuse-counseling done by me.  S/P BKA left-supportive care  Dyslipidemia-cont statains.  Class III morbid obesity BMI 41: Outpatient follow-up with PCP loss, healthy lifestyle.  Sleep apnea evaluation   Diet Order             Diet 2 gram sodium Room service appropriate? Yes; Fluid consistency: Thin  Diet effective now                   Patient's Body mass index is 41.44 kg/m.  DVT prophylaxis: heparin injection 5,000 Units Start: 02/27/21 0800 SCDs Start: 02/27/21 [id number] Code Status:   Code Status: Full Code   Family Communication: plan of care discussed with patient at bedside.  Status is: Inpatient Remains inpatient appropriate because:IV treatments appropriate due to intensity of illness or inability to take  PO and Inpatient level of care appropriate due to severity of illness Dispo: The patient is from: Home              Anticipated d/c is to: Home              Patient currently is not medically stable to d/c.   Difficult to place patient No Unresulted Labs (From admission, onward)     Start      Ordered   02/28/21 [id number]  Basic metabolic panel  Daily,   R      02/27/21 0753   02/28/21 0500  CBC  Tomorrow morning,   R        02/27/21 0831   02/27/21 1657  Creatinine, urine, random  Once,   R        02/27/21 1656   02/27/21 1657  Sodium, urine, random  Once,   R        02/27/21 1656           Medications reviewed:  Scheduled Meds:  atorvastatin  20 mg Oral Daily   carvedilol  25 mg Oral BID WC   ferrous sulfate  325 mg Oral TID WC   gabapentin  300 mg Oral BID   heparin  5,000 Units Subcutaneous Q8H   hydrALAZINE  50 mg Oral Q6H   insulin aspart  0-15 Units Subcutaneous TID WC   insulin aspart  0-5 Units Subcutaneous QHS   isosorbide mononitrate  30 mg Oral Daily   potassium chloride  10 mEq Oral BID   sodium chloride flush  3 mL Intravenous Q12H   Continuous Infusions:  sodium chloride     albumin human 12.5 g (02/28/21 [id number])   furosemide 100 mg (02/28/21 0826)   Consultants:see note  Procedures:see note Antimicrobials: Anti-infectives (From admission, onward)    None      Culture/Microbiology    Component Value Date/Time   SDES URINE, RANDOM 12/21/2020 0727   SPECREQUEST  12/21/2020 0727    NONE Performed at [facility], [address], [place], [place] [address]    CULT (A) 12/21/2020 0727    30,000 COLONIES/mL MULTIPLE SPECIES PRESENT, SUGGEST RECOLLECTION   REPTSTATUS 12/22/2020 FINAL 12/21/2020 0727    Other culture-see note  Objective: Vitals: Today's Vitals   02/28/21 0500 02/28/21 0624 02/28/21 0647 02/28/21 0745  BP:  (!) 185/109 (!) 157/96   Pulse:  82 80   Resp:  18 18   Temp:  98.2 F (36.8 C) 98.1 F (36.7 C)   TempSrc:  Oral Oral   SpO2:  98% 100%   Weight: 127.3 kg     Height: '5\' 9"'$  (1.753 m)     PainSc:    0-No pain    Intake/Output Summary (Last 24 hours) at 02/28/2021 1000 Last data filed at 02/28/2021 0700 Gross per 24 hour  Intake 830 ml  Output 450 ml  Net 380 ml   Filed Weights   02/27/21 0807 02/28/21  0500  Weight: 136.1 kg 127.3 kg   Weight change:   Intake/Output from previous day: 08/26 0701 - 08/27 0700 In: 830 [P.O.:720; IV Piggyback:110] Out: 450 [Urine:450] Intake/Output this shift: No intake/output data recorded. Filed Weights   02/27/21 0807 02/28/21 0500  Weight: 136.1 kg 127.3 kg   Examination: General exam: AAO x3, ill looking, 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 HEENT:Oral mucosa moist, Ear/Nose WNL grossly,dentition normal. Respiratory system: bilaterally diminished, no use of accessory muscle, non tender. Cardiovascular system:  S1 & S2 +,No JVD. Gastrointestinal system: Abdomen soft, NT,ND, BS+. Nervous System:Alert, awake, moving extremities Extremities: RLE edema, lt BKA, distal peripheral pulses palpable.  Skin: No rashes,no icterus. MSK: Normal muscle bulk,tone, power Data Reviewed: I have personally reviewed following labs and imaging studies CBC: Recent Labs  Lab 02/27/21 0622  WBC 7.2  NEUTROABS 5.6  HGB 9.1*  HCT 30.1*  MCV 95.0  PLT [id number]   Basic Metabolic Panel: Recent Labs  Lab 02/27/21 0622 02/28/21 0536  NA 141 146*  K 3.8 4.0  CL 113* 117*  CO2 22 21*  GLUCOSE 225* 146*  BUN 51* 54*  CREATININE 2.64*  2.50* 2.48*  CALCIUM 8.0* 8.4*  MG 1.8 1.9   GFR: Estimated Creatinine Clearance: 48.1 mL/min (A) (by C-G formula based on SCr of 2.48 mg/dL (H)). Liver Function Tests: Recent Labs  Lab 02/27/21 0622  AST 26  ALT 32  ALKPHOS 344*  BILITOT 1.2  PROT 6.5  ALBUMIN 2.0*   No results for input(s): LIPASE, AMYLASE in the last 168 hours. No results for input(s): AMMONIA in the last 168 hours. Coagulation Profile: No results for input(s): INR, PROTIME in the last 168 hours. Cardiac Enzymes: No results for input(s): CKTOTAL, CKMB, CKMBINDEX, TROPONINI in the last 168 hours. BNP (last 3 results) No results for input(s): PROBNP in the last 8760 hours. HbA1C: Recent Labs    02/25/21 1441 02/27/21 0840  HGBA1C 7.1* 7.2*    CBG: Recent Labs  Lab 02/27/21 0819 02/27/21 1202 02/27/21 1706 02/27/21 2200 02/28/21 0751  GLUCAP 225* 204* 156* 156* 143*   Lipid Profile: No results for input(s): CHOL, HDL, LDLCALC, TRIG, CHOLHDL, LDLDIRECT in the last 72 hours. Thyroid Function Tests: Recent Labs    02/27/21 0840  TSH 2.455   Anemia Panel: No results for input(s): VITAMINB12, FOLATE, FERRITIN, TIBC, IRON, RETICCTPCT in the last 72 hours. Sepsis Labs: No results for input(s): PROCALCITON, LATICACIDVEN in the last 168 hours.  Recent Results (from the past 240 hour(s))  Resp Panel by RT-PCR (Flu A&B, Covid) Nasopharyngeal Swab     Status: None   Collection Time: 02/27/21  5:26 AM   Specimen: Nasopharyngeal Swab; Nasopharyngeal(NP) swabs in vial transport medium  Result Value Ref Range Status   SARS Coronavirus 2 by RT PCR NEGATIVE NEGATIVE Final    Comment: (NOTE) SARS-CoV-2 target nucleic acids are NOT DETECTED.  The SARS-CoV-2 RNA is generally detectable in upper respiratory specimens during the acute phase of infection. The lowest concentration of SARS-CoV-2 viral copies this assay can detect is 138 copies/mL. A negative result does not preclude SARS-Cov-2 infection and should not be used as the sole basis for treatment or other patient management decisions. A negative result may occur with  improper specimen collection/handling, submission of specimen other than nasopharyngeal swab, presence of viral mutation(s) within the areas targeted by this assay, and inadequate number of viral copies(<138 copies/mL). A negative result must be combined with clinical observations, patient history, and epidemiological information. The expected result is Negative.  Fact Sheet for Patients:  EntrepreneurPulse.com.au  Fact Sheet for Healthcare Providers:  IncredibleEmployment.be  This test is no t yet approved or cleared by the [place] FDA and  has been authorized  for detection and/or diagnosis of SARS-CoV-2 by FDA under an Emergency Use Authorization (EUA). This EUA will remain  in effect (meaning this test can be used) for the duration of the COVID-19 declaration under Section 564(b)(1) of the Act, 21 U.S.C.section 360bbb-3(b)(1), unless the authorization is  terminated  or revoked sooner.       Influenza A by PCR NEGATIVE NEGATIVE Final   Influenza B by PCR NEGATIVE NEGATIVE Final    Comment: (NOTE) The Xpert Xpress SARS-CoV-2/FLU/RSV plus assay is intended as an aid in the diagnosis of influenza from Nasopharyngeal swab specimens and should not be used as a sole basis for treatment. Nasal washings and aspirates are unacceptable for Xpert Xpress SARS-CoV-2/FLU/RSV testing.  Fact Sheet for Patients: EntrepreneurPulse.com.au  Fact Sheet for Healthcare Providers: IncredibleEmployment.be  This test is not yet approved or cleared by the [place] FDA and has been authorized for detection and/or diagnosis of SARS-CoV-2 by FDA under an Emergency Use Authorization (EUA). This EUA will remain in effect (meaning this test can be used) for the duration of the COVID-19 declaration under Section 564(b)(1) of the Act, 21 U.S.C. section 360bbb-3(b)(1), unless the authorization is terminated or revoked.  Performed at [facility], [place] [address], [place], [place] [address]      Radiology Studies: US RENAL  Result Date: 02/27/2021 CLINICAL DATA:  Chronic renal disease.  Stage IV EXAM: RENAL / URINARY TRACT ULTRASOUND COMPLETE COMPARISON:  X-ray abdomen 12/16/2020, ultrasound renal 12/11/2020, CT abdomen pelvis 09/07/2014 FINDINGS: Right Kidney: Renal measurements: 12.8 x 5.7 x 5.8 cm = volume: 223 mL. Echogenicity increased. No mass or hydronephrosis visualized. Left Kidney: Renal measurements: 13.2 x 7.2 x 6.4 cm = volume: 317 mL. Echogenicity increased. No mass or hydronephrosis visualized.  Urinary bladder: Appears normal for degree of bladder distention. Other: At least small to moderate volume free fluid ascites. RIght pleural effusion. IMPRESSION: 1. Increased echogenicity of bilateral kidneys consistent with renal parenchymal disease. 2. At least small moderate volume simple free fluid ascites. 3. Right pleural effusion. Electronically Signed   By: Iven Finn M.D.   On: 02/27/2021 19:22   DG Chest Port 1 View  Result Date: 02/27/2021 CLINICAL DATA:  50 year old male with shortness of breath and swollen extremities. EXAM: PORTABLE CHEST 1 VIEW COMPARISON:  Chest radiographs 02/17/2021 and earlier. FINDINGS: Portable AP semi upright view at 0605 hours. Lordotic positioning. Stable lung volumes and cardiomegaly. Increasing bilateral indistinct perihilar opacity and pulmonary vascularity. New or increased veiling opacity in both lower lungs greater on the right. No pneumothorax. Paucity of bowel gas in the upper abdomen. No acute osseous abnormality identified. IMPRESSION: Progressed pulmonary edema and bilateral pleural effusions greater on the right. Electronically Signed   By: Genevie Ann M.D.   On: 02/27/2021 06:34   ECHOCARDIOGRAM COMPLETE  Result Date: 02/27/2021    ECHOCARDIOGRAM REPORT   Patient Name:   [name] Date of Exam: 02/27/2021 Medical Rec #:  [id number]            Height:       69.0 in Accession #:    [id number]           Weight:       300.0 lb Date of Birth:  [birth year]            BSA:          2.455 m Patient Age:    7 years             BP:           208/142 mmHg Patient Gender: M                    HR:           75 bpm.  Exam Location:  Inpatient Procedure: 2D Echo, Cardiac Doppler and Color Doppler  Reported to: Dr Cherlynn Kaiser on 02/27/2021 2:15:00 PM. Indications:    CHF  History:        Patient has no prior history of Echocardiogram examinations.                 Risk Factors:Hypertension and Diabetes.  Sonographer:    Luisa Hart RDCS Referring Phys:  [id number] Mckinley Jewel  Sonographer Comments: Patient is morbidly obese. IMPRESSIONS  1. Left ventricular ejection fraction, by estimation, is 40%. The left ventricle has moderately decreased function. The left ventricle demonstrates global hypokinesis. There is moderate-severe left ventricular hypertrophy. Left ventricular diastolic parameters are consistent with Grade III diastolic dysfunction (restrictive).  2. Right ventricular systolic function is moderately reduced. The right ventricular size is mildly enlarged. Moderately increased right ventricular wall thickness. There is moderately elevated pulmonary artery systolic pressure. The estimated right ventricular systolic pressure is [id number] mmHg.  3. Left atrial size was moderately dilated.  4. A small pericardial effusion is present. The pericardial effusion is circumferential. There is no evidence of cardiac tamponade. Large pleural effusion.  5. The mitral valve is grossly normal. Trivial mitral valve regurgitation. No evidence of mitral stenosis.  6. Tricuspid valve regurgitation is moderate.  7. The aortic valve is tricuspid. Aortic valve regurgitation is not visualized. No aortic stenosis is present.  8. The inferior vena cava is normal in size with <50% respiratory variability, suggesting right atrial pressure of 8 mmHg. FINDINGS  Left Ventricle: Left ventricular ejection fraction, by estimation, is 40%. The left ventricle has moderately decreased function. The left ventricle demonstrates global hypokinesis. The left ventricular internal cavity size was normal in size. There is moderate-severe left ventricular hypertrophy. Left ventricular diastolic parameters are consistent with Grade III diastolic dysfunction (restrictive). Right Ventricle: The right ventricular size is mildly enlarged. Moderately increased right ventricular wall thickness. Right ventricular systolic function is moderately reduced. There is moderately elevated pulmonary artery systolic  pressure. The tricuspid regurgitant velocity is 3.56 m/s, and with an assumed right atrial pressure of 8 mmHg, the estimated right ventricular systolic pressure is [id number] mmHg. Left Atrium: Left atrial size was moderately dilated. Right Atrium: Right atrial size was normal in size. Pericardium: A small pericardial effusion is present. The pericardial effusion is circumferential. There is no evidence of cardiac tamponade. Mitral Valve: The mitral valve is grossly normal. Trivial mitral valve regurgitation. No evidence of mitral valve stenosis. MV peak gradient, 5.0 mmHg. The mean mitral valve gradient is 2.0 mmHg. Tricuspid Valve: The tricuspid valve is grossly normal. Tricuspid valve regurgitation is moderate. Aortic Valve: The aortic valve is tricuspid. Aortic valve regurgitation is not visualized. No aortic stenosis is present. Aortic valve mean gradient measures 2.0 mmHg. Aortic valve peak gradient measures 3.1 mmHg. Aortic valve area, by VTI measures 4.57 cm. Pulmonic Valve: The pulmonic valve was thickened with good excursion. Pulmonic valve regurgitation is mild. No evidence of pulmonic stenosis. Aorta: The aortic root is normal in size and structure. Venous: The inferior vena cava is normal in size with less than 50% respiratory variability, suggesting right atrial pressure of 8 mmHg. IAS/Shunts: No atrial level shunt detected by color flow Doppler. Additional Comments: There is a large pleural effusion. Mild ascites is present.  LEFT VENTRICLE PLAX 2D LVIDd:         4.35 cm     Diastology LVIDs:         3.60 cm  LV e' medial:    2.70 cm/s LV PW:         1.40 cm     LV E/e' medial:  36.1 LV IVS:        1.65 cm     LV e' lateral:   5.70 cm/s LVOT diam:     2.40 cm     LV E/e' lateral: 17.1 LV SV:         83 LV SV Index:   34 LVOT Area:     4.52 cm  LV Volumes (MOD) LV vol d, MOD A2C: 48.3 ml LV vol d, MOD A4C: 68.2 ml LV vol s, MOD A2C: 37.9 ml LV vol s, MOD A4C: 62.8 ml LV SV MOD A2C:     10.4 ml LV SV MOD  A4C:     68.2 ml LV SV MOD BP:      9.3 ml RIGHT VENTRICLE RV Basal diam:  4.70 cm RV Mid diam:    3.80 cm RV S prime:     6.98 cm/s TAPSE (M-mode): 1.8 cm LEFT ATRIUM              Index       RIGHT ATRIUM           Index LA diam:        5.20 cm  2.12 cm/m  RA Area:     19.20 cm LA Vol (A2C):   85.4 ml  34.79 ml/m RA Volume:   53.70 ml  21.88 ml/m LA Vol (A4C):   118.0 ml 48.07 ml/m LA Biplane Vol: 104.0 ml 42.37 ml/m  AORTIC VALVE                   PULMONIC VALVE AV Area (Vmax):    4.65 cm    PV Vmax:          0.60 m/s AV Area (Vmean):   4.59 cm    PV Vmean:         40.700 cm/s AV Area (VTI):     4.57 cm    PV VTI:           0.130 m AV Vmax:           88.30 cm/s  PV Peak grad:     1.5 mmHg AV Vmean:          62.100 cm/s PV Mean grad:     1.0 mmHg AV VTI:            0.181 m     PR End Diast Vel: 13.25 msec AV Peak Grad:      3.1 mmHg AV Mean Grad:      2.0 mmHg LVOT Vmax:         90.70 cm/s LVOT Vmean:        63.000 cm/s LVOT VTI:          0.183 m LVOT/AV VTI ratio: 1.01  AORTA Ao Root diam: 3.10 cm Ao Asc diam:  2.80 cm MITRAL VALVE               TRICUSPID VALVE MV Area (PHT): 4.94 cm    TR Peak grad:   50.7 mmHg MV Area VTI:   3.16 cm    TR Vmax:        356.00 cm/s MV Peak grad:  5.0 mmHg MV Mean grad:  2.0 mmHg    SHUNTS MV Vmax:       1.12 m/s  Systemic VTI:  0.18 m MV Vmean:      70.5 cm/s   Systemic Diam: 2.40 cm MV Decel Time: 154 msec MV E velocity: 97.43 cm/s MV A velocity: 48.77 cm/s MV E/A ratio:  2.00 Cherlynn Kaiser MD Electronically signed by Cherlynn Kaiser MD Signature Date/Time: 02/27/2021/2:38:23 PM    Final      LOS: 1 day   Antonieta Pert, MD [facility]  02/28/2021, 10:00 AM

## 2021-02-28 NOTE — Progress Notes (Signed)
Notified on call provider about patient's elevated BP of 180/116. On call provider gave a one time order for clonidine tab 0.1 mg by mouth.

## 2021-02-28 NOTE — Progress Notes (Signed)
Report received from A. Dickey,RN. No change in assessment. Will continue plan of care. Lanisa Ishler Johnson, RN  

## 2021-02-28 NOTE — Plan of Care (Signed)

## 2021-02-28 NOTE — Progress Notes (Signed)
Renal artery duplex study completed.   Please see CV Proc for preliminary results.   Lydia Toren, RDMS, RVT  

## 2021-02-28 NOTE — Progress Notes (Addendum)
Progress Note  Patient Name: Todd Mccoy Date of Encounter: 02/28/2021  [facility] Cardiologist: None   Subjective   Feeling better.  Denies any chest pain, SOB  Inpatient Medications    Scheduled Meds:  atorvastatin  20 mg Oral Daily   carvedilol  25 mg Oral BID WC   ferrous sulfate  325 mg Oral TID WC   gabapentin  300 mg Oral BID   heparin  5,000 Units Subcutaneous Q8H   hydrALAZINE  25 mg Oral Q8H   insulin aspart  0-15 Units Subcutaneous TID WC   insulin aspart  0-5 Units Subcutaneous QHS   isosorbide mononitrate  30 mg Oral Daily   potassium chloride  10 mEq Oral BID   sodium chloride flush  3 mL Intravenous Q12H   Continuous Infusions:  sodium chloride     albumin human 12.5 g (02/28/21 [id number])   furosemide 100 mg (02/27/21 2253)   PRN Meds: sodium chloride, acetaminophen, hydrALAZINE, ondansetron (ZOFRAN) IV, oxyCODONE, polyvinyl alcohol, sodium chloride flush   Vital Signs    Vitals:   02/28/21 0449 02/28/21 0500 02/28/21 0624 02/28/21 0647  BP: (!) 180/116  (!) 185/109 (!) 157/96  Pulse:   82 80  Resp:   18 18  Temp:   98.2 F (36.8 C) 98.1 F (36.7 C)  TempSrc:   Oral Oral  SpO2:   98% 100%  Weight:  127.3 kg    Height:  '5\' 9"'$  (1.753 m)      Intake/Output Summary (Last 24 hours) at 02/28/2021 [id number] Last data filed at 02/28/2021 0700 Gross per 24 hour  Intake 830 ml  Output 450 ml  Net 380 ml   Last 3 Weights 02/28/2021 02/27/2021 02/11/2021  Weight (lbs) 280 lb 10.3 oz 300 lb 174 lb 2.6 oz  Weight (kg) 127.3 kg 136.079 kg 79 kg      Telemetry    NSR - Personally Reviewed  ECG    No new EKG to review - Personally Reviewed  Physical Exam   GEN: No acute distress.   Neck: No JVD Cardiac: RRR, no murmurs, rubs, or gallops.  Respiratory: crackles at bases GI: Soft, nontender, non-distended  MS: No edema; L BKA, mild edema RLE with excoriations on his leg and foot Neuro:  Nonfocal  Psych: Normal affect   Labs    High  Sensitivity Troponin:  No results for input(s): TROPONINIHS in the last 720 hours.    Chemistry Recent Labs  Lab 02/27/21 0622 02/28/21 0536  NA 141 146*  K 3.8 4.0  CL 113* 117*  CO2 22 21*  GLUCOSE 225* 146*  BUN 51* 54*  CREATININE 2.64*  2.50* 2.48*  CALCIUM 8.0* 8.4*  PROT 6.5  --   ALBUMIN 2.0*  --   AST 26  --   ALT 32  --   ALKPHOS 344*  --   BILITOT 1.2  --   GFRNONAA 29*  31* 31*  ANIONGAP 6 8     Hematology Recent Labs  Lab 02/27/21 0622  WBC 7.2  RBC 3.17*  HGB 9.1*  HCT 30.1*  MCV 95.0  MCH 28.7  MCHC 30.2  RDW 19.4*  PLT 218    BNP Recent Labs  Lab 02/27/21 0622  BNP >4,500.0*     DDimer No results for input(s): DDIMER in the last 168 hours.  Radiology    US RENAL  Result Date: 02/27/2021 CLINICAL DATA:  Chronic renal disease.  Stage IV EXAM: RENAL /  URINARY TRACT ULTRASOUND COMPLETE COMPARISON:  X-ray abdomen 12/16/2020, ultrasound renal 12/11/2020, CT abdomen pelvis 09/07/2014 FINDINGS: Right Kidney: Renal measurements: 12.8 x 5.7 x 5.8 cm = volume: 223 mL. Echogenicity increased. No mass or hydronephrosis visualized. Left Kidney: Renal measurements: 13.2 x 7.2 x 6.4 cm = volume: 317 mL. Echogenicity increased. No mass or hydronephrosis visualized. Urinary bladder: Appears normal for degree of bladder distention. Other: At least small to moderate volume free fluid ascites. RIght pleural effusion. IMPRESSION: 1. Increased echogenicity of bilateral kidneys consistent with renal parenchymal disease. 2. At least small moderate volume simple free fluid ascites. 3. Right pleural effusion. Electronically Signed   By: Iven Finn M.D.   On: 02/27/2021 19:22   DG Chest Port 1 View  Result Date: 02/27/2021 CLINICAL DATA:  50 year old male with shortness of breath and swollen extremities. EXAM: PORTABLE CHEST 1 VIEW COMPARISON:  Chest radiographs 02/17/2021 and earlier. FINDINGS: Portable AP semi upright view at 0605 hours. Lordotic positioning.  Stable lung volumes and cardiomegaly. Increasing bilateral indistinct perihilar opacity and pulmonary vascularity. New or increased veiling opacity in both lower lungs greater on the right. No pneumothorax. Paucity of bowel gas in the upper abdomen. No acute osseous abnormality identified. IMPRESSION: Progressed pulmonary edema and bilateral pleural effusions greater on the right. Electronically Signed   By: Genevie Ann M.D.   On: 02/27/2021 06:34   ECHOCARDIOGRAM COMPLETE  Result Date: 02/27/2021    ECHOCARDIOGRAM REPORT   Patient Name:   Todd Mccoy Date of Exam: 02/27/2021 Medical Rec #:  [id number]            Height:       69.0 in Accession #:    [id number]           Weight:       300.0 lb Date of Birth:  1970-11-27            BSA:          2.455 m Patient Age:    50 years             BP:           208/142 mmHg Patient Gender: M                    HR:           75 bpm. Exam Location:  Inpatient Procedure: 2D Echo, Cardiac Doppler and Color Doppler  Reported to: Dr Cherlynn Kaiser on 02/27/2021 2:15:00 PM. Indications:    CHF  History:        Patient has no prior history of Echocardiogram examinations.                 Risk Factors:Hypertension and Diabetes.  Sonographer:    Luisa Hart RDCS Referring Phys: [id number] Mckinley Jewel  Sonographer Comments: Patient is morbidly obese. IMPRESSIONS  1. Left ventricular ejection fraction, by estimation, is 40%. The left ventricle has moderately decreased function. The left ventricle demonstrates global hypokinesis. There is moderate-severe left ventricular hypertrophy. Left ventricular diastolic parameters are consistent with Grade III diastolic dysfunction (restrictive).  2. Right ventricular systolic function is moderately reduced. The right ventricular size is mildly enlarged. Moderately increased right ventricular wall thickness. There is moderately elevated pulmonary artery systolic pressure. The estimated right ventricular systolic pressure is [id number] mmHg.  3.  Left atrial size was moderately dilated.  4. A small pericardial effusion is present. The pericardial effusion is circumferential. There is no evidence of cardiac  tamponade. Large pleural effusion.  5. The mitral valve is grossly normal. Trivial mitral valve regurgitation. No evidence of mitral stenosis.  6. Tricuspid valve regurgitation is moderate.  7. The aortic valve is tricuspid. Aortic valve regurgitation is not visualized. No aortic stenosis is present.  8. The inferior vena cava is normal in size with <50% respiratory variability, suggesting right atrial pressure of 8 mmHg. FINDINGS  Left Ventricle: Left ventricular ejection fraction, by estimation, is 40%. The left ventricle has moderately decreased function. The left ventricle demonstrates global hypokinesis. The left ventricular internal cavity size was normal in size. There is moderate-severe left ventricular hypertrophy. Left ventricular diastolic parameters are consistent with Grade III diastolic dysfunction (restrictive). Right Ventricle: The right ventricular size is mildly enlarged. Moderately increased right ventricular wall thickness. Right ventricular systolic function is moderately reduced. There is moderately elevated pulmonary artery systolic pressure. The tricuspid regurgitant velocity is 3.56 m/s, and with an assumed right atrial pressure of 8 mmHg, the estimated right ventricular systolic pressure is [id number] mmHg. Left Atrium: Left atrial size was moderately dilated. Right Atrium: Right atrial size was normal in size. Pericardium: A small pericardial effusion is present. The pericardial effusion is circumferential. There is no evidence of cardiac tamponade. Mitral Valve: The mitral valve is grossly normal. Trivial mitral valve regurgitation. No evidence of mitral valve stenosis. MV peak gradient, 5.0 mmHg. The mean mitral valve gradient is 2.0 mmHg. Tricuspid Valve: The tricuspid valve is grossly normal. Tricuspid valve regurgitation is  moderate. Aortic Valve: The aortic valve is tricuspid. Aortic valve regurgitation is not visualized. No aortic stenosis is present. Aortic valve mean gradient measures 2.0 mmHg. Aortic valve peak gradient measures 3.1 mmHg. Aortic valve area, by VTI measures 4.57 cm. Pulmonic Valve: The pulmonic valve was thickened with good excursion. Pulmonic valve regurgitation is mild. No evidence of pulmonic stenosis. Aorta: The aortic root is normal in size and structure. Venous: The inferior vena cava is normal in size with less than 50% respiratory variability, suggesting right atrial pressure of 8 mmHg. IAS/Shunts: No atrial level shunt detected by color flow Doppler. Additional Comments: There is a large pleural effusion. Mild ascites is present.  LEFT VENTRICLE PLAX 2D LVIDd:         4.35 cm     Diastology LVIDs:         3.60 cm     LV e' medial:    2.70 cm/s LV PW:         1.40 cm     LV E/e' medial:  36.1 LV IVS:        1.65 cm     LV e' lateral:   5.70 cm/s LVOT diam:     2.40 cm     LV E/e' lateral: 17.1 LV SV:         83 LV SV Index:   34 LVOT Area:     4.52 cm  LV Volumes (MOD) LV vol d, MOD A2C: 48.3 ml LV vol d, MOD A4C: 68.2 ml LV vol s, MOD A2C: 37.9 ml LV vol s, MOD A4C: 62.8 ml LV SV MOD A2C:     10.4 ml LV SV MOD A4C:     68.2 ml LV SV MOD BP:      9.3 ml RIGHT VENTRICLE RV Basal diam:  4.70 cm RV Mid diam:    3.80 cm RV S prime:     6.98 cm/s TAPSE (M-mode): 1.8 cm LEFT ATRIUM  Index       RIGHT ATRIUM           Index LA diam:        5.20 cm  2.12 cm/m  RA Area:     19.20 cm LA Vol (A2C):   85.4 ml  34.79 ml/m RA Volume:   53.70 ml  21.88 ml/m LA Vol (A4C):   118.0 ml 48.07 ml/m LA Biplane Vol: 104.0 ml 42.37 ml/m  AORTIC VALVE                   PULMONIC VALVE AV Area (Vmax):    4.65 cm    PV Vmax:          0.60 m/s AV Area (Vmean):   4.59 cm    PV Vmean:         40.700 cm/s AV Area (VTI):     4.57 cm    PV VTI:           0.130 m AV Vmax:           88.30 cm/s  PV Peak grad:     1.5  mmHg AV Vmean:          62.100 cm/s PV Mean grad:     1.0 mmHg AV VTI:            0.181 m     PR End Diast Vel: 13.25 msec AV Peak Grad:      3.1 mmHg AV Mean Grad:      2.0 mmHg LVOT Vmax:         90.70 cm/s LVOT Vmean:        63.000 cm/s LVOT VTI:          0.183 m LVOT/AV VTI ratio: 1.01  AORTA Ao Root diam: 3.10 cm Ao Asc diam:  2.80 cm MITRAL VALVE               TRICUSPID VALVE MV Area (PHT): 4.94 cm    TR Peak grad:   50.7 mmHg MV Area VTI:   3.16 cm    TR Vmax:        356.00 cm/s MV Peak grad:  5.0 mmHg MV Mean grad:  2.0 mmHg    SHUNTS MV Vmax:       1.12 m/s    Systemic VTI:  0.18 m MV Vmean:      70.5 cm/s   Systemic Diam: 2.40 cm MV Decel Time: 154 msec MV E velocity: 97.43 cm/s MV A velocity: 48.77 cm/s MV E/A ratio:  2.00 Cherlynn Kaiser MD Electronically signed by Cherlynn Kaiser MD Signature Date/Time: 02/27/2021/2:38:23 PM    Final     Cardiac Studies   2D echo 02/27/2021 IMPRESSIONS    1. Left ventricular ejection fraction, by estimation, is 40%. The left  ventricle has moderately decreased function. The left ventricle  demonstrates global hypokinesis. There is moderate-severe left ventricular  hypertrophy. Left ventricular diastolic  parameters are consistent with Grade III diastolic dysfunction  (restrictive).   2. Right ventricular systolic function is moderately reduced. The right  ventricular size is mildly enlarged. Moderately increased right  ventricular wall thickness. There is moderately elevated pulmonary artery  systolic pressure. The estimated right  ventricular systolic pressure is [id number] mmHg.   3. Left atrial size was moderately dilated.   4. A small pericardial effusion is present. The pericardial effusion is  circumferential. There is no evidence of cardiac tamponade. Large pleural  effusion.   5.  The mitral valve is grossly normal. Trivial mitral valve  regurgitation. No evidence of mitral stenosis.   6. Tricuspid valve regurgitation is moderate.   7. The  aortic valve is tricuspid. Aortic valve regurgitation is not  visualized. No aortic stenosis is present.   8. The inferior vena cava is normal in size with <50% respiratory  variability, suggesting right atrial pressure of 8 mmHg.   Patient Profile     50 y.o. 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 Doristine Bosworth.  Assessment & Plan    1. Anasarca/massive volume overload/Acute combined systolic/diastolic CHF superimposed on baseline renal insufficiency with hypoalbuminemia - given BNP, history and clinical presentation there is great concern for severe congestive heart failure - also confounding the issue is is poorly controlled HTN and advanced kidney dysfunction with possible component of nephrotic issue as evidenced by low albumin - 2D echo showed mild biventricular dysfunction with EF 40% and moderate RV dysfunction with moderate PHTN with large pleural effusion and small pericardial effusion, moderate TR and elevated RAP at 60mHg and G3DD  - 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  Not a cath or coronary CTA candidate at this time due to AKI on CKD - currently on IV Lasix and albumin for nephrotic syndrome - ? If I&O's are incomplete - SCr slightly improved from 2.64>2.48 - strict I/o's, daily weights - diuretics per nephrology - continue Carvedilol '25mg'$  BID - no ACE/ARB/ARNi/MRN in setting of AKI on CKD - increase Hydralazine to '50mg'$  qid>>needs aggressive treatment of HTN   2. CKD stage IV  - d/w IM, and  nephrology consulting for probable nephrotic syndrome - follow     3. Uncontrolled HTN - BP still elevated at 185/1066mg - continue carvedilol '25mg'$  BID and Imdur '30mg'$  daily - losartan on hold while actively diuresing in setting of AKI on CKD - increase hydralazine to '50mg'$  QID and titrate as needed this stay - TSH wnl - renal artery duplex pending to rule out RAS - consider OP sleep study   4. Tobacco abuse - cessation will be important for recovery   5. Morbid obesity - consider OP sleep study   6. DM - per primary team   7. Chronic anemia - per primary team - Hg 9.1, slightly higher than previous values     I have spent a total of 35 minutes with patient reviewing 2D echo, Renal consult , telemetry, EKGs, labs and examining patient as well as establishing an assessment and plan that was discussed with the patient.  > 50% of time was spent in direct patient care.     For questions or updates, please contact [facility] consult [email or web address] for contact info under        Signed, TrFransico HimMD  02/28/2021, 8:22 AM

## 2021-03-01 ENCOUNTER — Other Ambulatory Visit: Payer: Self-pay

## 2021-03-01 DIAGNOSIS — Z6841 Body Mass Index (BMI) 40.0 and over, adult: Secondary | ICD-10-CM | POA: Diagnosis not present

## 2021-03-01 DIAGNOSIS — I472 Ventricular tachycardia: Secondary | ICD-10-CM | POA: Diagnosis not present

## 2021-03-01 DIAGNOSIS — N184 Chronic kidney disease, stage 4 (severe): Secondary | ICD-10-CM | POA: Diagnosis not present

## 2021-03-01 DIAGNOSIS — I130 Hypertensive heart and chronic kidney disease with heart failure and stage 1 through stage 4 chronic kidney disease, or unspecified chronic kidney disease: Secondary | ICD-10-CM | POA: Diagnosis not present

## 2021-03-01 DIAGNOSIS — I10 Essential (primary) hypertension: Secondary | ICD-10-CM | POA: Diagnosis not present

## 2021-03-01 DIAGNOSIS — E8809 Other disorders of plasma-protein metabolism, not elsewhere classified: Secondary | ICD-10-CM | POA: Diagnosis not present

## 2021-03-01 DIAGNOSIS — I5041 Acute combined systolic (congestive) and diastolic (congestive) heart failure: Secondary | ICD-10-CM | POA: Diagnosis not present

## 2021-03-01 DIAGNOSIS — Z20822 Contact with and (suspected) exposure to covid-19: Secondary | ICD-10-CM | POA: Diagnosis not present

## 2021-03-01 DIAGNOSIS — N179 Acute kidney failure, unspecified: Secondary | ICD-10-CM

## 2021-03-01 DIAGNOSIS — R601 Generalized edema: Secondary | ICD-10-CM | POA: Diagnosis not present

## 2021-03-01 DIAGNOSIS — I160 Hypertensive urgency: Secondary | ICD-10-CM

## 2021-03-01 DIAGNOSIS — D509 Iron deficiency anemia, unspecified: Secondary | ICD-10-CM | POA: Diagnosis not present

## 2021-03-01 DIAGNOSIS — I272 Pulmonary hypertension, unspecified: Secondary | ICD-10-CM | POA: Diagnosis not present

## 2021-03-01 DIAGNOSIS — E11319 Type 2 diabetes mellitus with unspecified diabetic retinopathy without macular edema: Secondary | ICD-10-CM | POA: Diagnosis not present

## 2021-03-01 LAB — CBC
HCT: 27.6 % — ABNORMAL LOW (ref 39.0–52.0)
Hemoglobin: 8.5 g/dL — ABNORMAL LOW (ref 13.0–17.0)
MCH: 28.1 pg (ref 26.0–34.0)
MCHC: 30.8 g/dL (ref 30.0–36.0)
MCV: 91.1 fL (ref 80.0–100.0)
Platelets: 209 10*3/uL (ref 150–400)
RBC: 3.03 MIL/uL — ABNORMAL LOW (ref 4.22–5.81)
RDW: 18.7 % — ABNORMAL HIGH (ref 11.5–15.5)
WBC: 6.1 10*3/uL (ref 4.0–10.5)
nRBC: 0 % (ref 0.0–0.2)

## 2021-03-01 LAB — BASIC METABOLIC PANEL
Anion gap: 7 (ref 5–15)
BUN: 53 mg/dL — ABNORMAL HIGH (ref 6–20)
CO2: 23 mmol/L (ref 22–32)
Calcium: 8.2 mg/dL — ABNORMAL LOW (ref 8.9–10.3)
Chloride: 113 mmol/L — ABNORMAL HIGH (ref 98–111)
Creatinine, Ser: 2.67 mg/dL — ABNORMAL HIGH (ref 0.61–1.24)
GFR, Estimated: 29 mL/min — ABNORMAL LOW (ref >60)
Glucose, Bld: 156 mg/dL — ABNORMAL HIGH (ref 70–99)
Potassium: 4 mmol/L (ref 3.5–5.1)
Sodium: 143 mmol/L (ref 135–145)

## 2021-03-01 LAB — GLUCOSE, CAPILLARY
Glucose-Capillary: 135 mg/dL — ABNORMAL HIGH (ref 70–99)
Glucose-Capillary: 179 mg/dL — ABNORMAL HIGH (ref 70–99)
Glucose-Capillary: 180 mg/dL — ABNORMAL HIGH (ref 70–99)
Glucose-Capillary: 142 mg/dL — ABNORMAL HIGH (ref 70–99)
Glucose-Capillary: 126 mg/dL — ABNORMAL HIGH (ref 70–99)

## 2021-03-01 MED ORDER — HYDRALAZINE HCL 25 MG PO TABS
75.0000 mg | ORAL_TABLET | Freq: Four times a day (QID) | ORAL | Status: DC
  Administered 2021-03-01 – 2021-03-05 (×15): 75 mg via ORAL
  Filled 2021-03-01 (×15): qty 3

## 2021-03-01 MED ORDER — POLYETHYLENE GLYCOL 3350 17 G PO PACK
17.0000 g | PACK | Freq: Every day | ORAL | Status: DC | PRN
  Administered 2021-03-01: 17 g via ORAL
  Filled 2021-03-01: qty 1

## 2021-03-01 NOTE — Progress Notes (Signed)
Physical Therapy Treatment Patient Details Name: Todd Mccoy MRN: [id number] DOB: 23-Jun-1971 Today's Date: 03/01/2021    History of Present Illness Patient is a 50 year old male presenting with diarrhea, swelling, shortness of breath for more than a month with multiple ED visits. Pt diagnosed with progressive pulmonary edema and bilateral pleural effusion, Acute combined systolic/diastolic CHF superimposed on baseline renal insufficiency and hypoalbuminemia, anascarsa. 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 Mod A for bed mobility and lateral scooting along side of bed. Pt declined to attempt xfer to recliner or standing 2* weakness/scrotal edema. Will continue to follow and progress activity as tolerated.     Follow Up Recommendations  SNF (HHPT 24 hour supervision/assist if pt declines placement)     Equipment Recommendations   (pt reports WC and BSC are too small/narrow. May need home assessment and evaluation to see if wider equipment can be accommodated in home)    Recommendations for Other Services       Precautions / Restrictions Precautions Precautions: Fall Precaution Comments: monitor vitals, swollen scrotum, does not yet have prosthesis for L LE Restrictions Weight Bearing Restrictions: No    Mobility  Bed Mobility Overal bed mobility: Needs Assistance Bed Mobility: Supine to Sit     Supine to sit: Mod assist;HOB elevated Sit to supine: Mod assist;HOB elevated   General bed mobility comments: Assist for trunk and LEs. Cues for technique, safety. Increased time.  Min guard for sitting balance. Sat EOB ~5 minutes.    Transfers Overall transfer level: Needs assistance   Transfers: Lateral/Scoot Transfers          Lateral/Scoot Transfers: Mod assist General transfer comment: Mod A and use of bedpad to assist pt with scooting towards HOB. Cues  for safety, technique. Increased time.  Ambulation/Gait                 Stairs             Wheelchair Mobility    Modified Rankin (Stroke Patients Only)       Balance Overall balance assessment: Needs assistance Sitting-balance support: Feet supported Sitting balance-Leahy Scale: Fair     Standing balance support: Bilateral upper extremity supported;During functional activity Standing balance-Leahy Scale: Fair                              Cognition Arousal/Alertness: Awake/alert Behavior During Therapy: WFL for tasks assessed/performed Overall Cognitive Status: Within Functional Limits for tasks assessed                                        Exercises      General Comments        Pertinent Vitals/Pain Pain Assessment: Faces Faces Pain Scale: Hurts little more Pain Location: scrotum Pain Descriptors / Indicators: Discomfort;Sore Pain Intervention(s): Limited activity within patient's tolerance;Monitored during session;Repositioned    Home Living                      Prior Function            PT Goals (current goals can now be found in the care plan section) Progress towards PT goals: Progressing toward goals    Frequency  Min 2X/week      PT Plan Current plan remains appropriate    Co-evaluation              AM-PAC PT "6 Clicks" Mobility   Outcome Measure  Help needed turning from your back to your side while in a flat bed without using bedrails?: A Lot Help needed moving from lying on your back to sitting on the side of a flat bed without using bedrails?: A Lot Help needed moving to and from a bed to a chair (including a wheelchair)?: A Lot Help needed standing up from a chair using your arms (e.g., wheelchair or bedside chair)?: Total Help needed to walk in hospital room?: Total Help needed climbing 3-5 steps with a railing? : Total 6 Click Score: 9    End of Session   Activity  Tolerance: Patient limited by fatigue;Patient limited by pain Patient left: in bed;with call bell/phone within reach;with bed alarm set   PT Visit Diagnosis: Pain;Muscle weakness (generalized) (M62.81);Other abnormalities of gait and mobility (R26.89)     Time: [id number] PT Time Calculation (min) (ACUTE ONLY): 12 min  Charges:  $Therapeutic Activity: 8-22 mins                         Doreatha Massed, PT Acute Rehabilitation  Office: [phone] Pager: [phone]

## 2021-03-01 NOTE — Plan of Care (Deleted)
  Problem: Education: Goal: Knowledge of General Education information will improve Description: Including pain rating scale, medication(s)/side effects and non-pharmacologic comfort measures 03/01/2021 0310 by Baker Pierini, RN Outcome: Progressing 03/01/2021 0027 by Baker Pierini, RN Outcome: Progressing   Problem: Coping: Goal: Level of anxiety will decrease 03/01/2021 0310 by Baker Pierini, RN Outcome: Progressing 03/01/2021 0027 by Baker Pierini, RN Outcome: Progressing   Problem: Elimination: Goal: Will not experience complications related to urinary retention 03/01/2021 0310 by Baker Pierini, RN Outcome: Progressing 03/01/2021 0027 by Baker Pierini, RN Outcome: Progressing   Problem: Pain Managment: Goal: General experience of comfort will improve 03/01/2021 0310 by Baker Pierini, RN Outcome: Progressing 03/01/2021 0027 by Baker Pierini, RN Outcome: Progressing   Problem: Safety: Goal: Ability to remain free from injury will improve 03/01/2021 0310 by Baker Pierini, RN Outcome: Progressing 03/01/2021 0027 by Baker Pierini, RN Outcome: Progressing   Problem: Skin Integrity: Goal: Risk for impaired skin integrity will decrease 03/01/2021 0310 by Baker Pierini, RN Outcome: Progressing 03/01/2021 0027 by Baker Pierini, RN Outcome: Progressing

## 2021-03-01 NOTE — Progress Notes (Signed)
[facility] Progress Note  Subjective: 2.4 L yest and 2.5 L today UOP so far. No new c/o  Vitals:   03/01/21 0627 03/01/21 0735 03/01/21 1314 03/01/21 1314  BP: (!) 169/105 (!) 155/96 (!) 177/110   Pulse: 73 76 79   Resp: _0 Temp: 98 F (36.7 C) 97.9 F (36.6 C)  98.3 F (36.8 C)  TempSrc: Oral Oral  Oral  SpO2: 100% 99%    Weight:      Height:        Exam:  alert, nad   no jvd  Chest dec'd bilat at bases, no rales/ wheezing  RRR no MRG  Abd soft ntnd no mass or ascites +bs Ext 2- 3+ bilat pitting leg/hip / scrotal/ abd and thoracic wall edema Neuro is alert, Ox 3 , nf, no asterixis       Home meds include lipitor, coreg bid, lasix 40 qd, lantus, neurontin 300 bid, losartan 100, imdur 30, oxy IR, prn's     CXR - IMPRESSION: Progressed pulmonary edema and bilateral pleural effusions greater on the right.    UA  8/26 - >300 prot, rare bact, 0-5 wbc, 21-50 rbc    UPC ratio 8/26 = 7.26 December 2020 - ANA, ANCA, antiGBM, dsDNA, hep B/C, complements negative         Date                         Creat               eGFR    2015- 17                   0.5- 0.7    2021                         1.14- 1.35    April 2022                 1.35    May 2022                 1.54- 1.84        45- 55, IIIa    June 9- 20, 2022      3.15- 3.36        21- 24, IV    January 21, 2021           2.56                 22,    Aug 10                      2.44                 32    Aug 16                      2.53    Feb 27, 2021           2.64                 29, IV       BP 180 - 225 / 115- 145, RA 98%   , HR 72  RR 20       Assessment/ Plan: CKD IV - progressive over the last 1-2 years. Proteinuria is worse here now than earlier this  year. 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 Alb 2.0. He has massive edema up to the scapulae bilat, also pleural effusions and possibly pulm edema, not symptomatic at rest. On nasal O2. Continue IV  lasix 120 mg q8 hrs, and po zaroxolyn 5 bid. Diuresing well, but no signs of edema improving yet, anticipate long hospital stay. Creat stable.  Will follow.  DM on insulin Vol overload - massive volume overload HTN - let's avoid ARB/ acei while diuresing, can use other agents.          Rob Claritza July 03/01/2021, 3:00 PM   Recent Labs  Lab 02/28/21 0536 03/01/21 0517  K 4.0 4.0  BUN 54* 53*  CREATININE 2.48* 2.67*  CALCIUM 8.4* 8.2*  HGB 8.7* 8.5*    Inpatient medications:  atorvastatin  20 mg Oral Daily   carvedilol  25 mg Oral BID WC   ferrous sulfate  325 mg Oral TID WC   gabapentin  300 mg Oral BID   heparin  5,000 Units Subcutaneous Q8H   hydrALAZINE  75 mg Oral Q6H   insulin aspart  0-15 Units Subcutaneous TID WC   insulin aspart  0-5 Units Subcutaneous QHS   isosorbide mononitrate  30 mg Oral Daily   metolazone  5 mg Oral BID   potassium chloride  10 mEq Oral BID   sodium chloride flush  3 mL Intravenous Q12H    sodium chloride     furosemide 120 mg (03/01/21 0756)   sodium chloride, acetaminophen, hydrALAZINE, ondansetron (ZOFRAN) IV, oxyCODONE, polyethylene glycol, polyvinyl alcohol, sodium chloride flush

## 2021-03-01 NOTE — Plan of Care (Signed)

## 2021-03-01 NOTE — Progress Notes (Signed)
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 In ED: "Temperature 97.5, pulse 82, RR: 18, blood pressure 225/176, maintaining oxygen saturation on room air.  BNP more than 4500, 5 0, BUN 51, GFR: 31, H&H: 9.1/30.1, COVID-19 negative.  Chest x-ray shows progressive pulmonary edema and bilateral pleural effusion greater on right side.  Patient was given Lasix 80 IV once in ED.ther evaluation." Cardiology, nephrology consulted  Subjective: Seen this morning. Still has significant edema up to his abdomen and thigh Asking how long he will be in the hospital. overnight blood pressure 150s to 170s, on room air Creatinine slightly up to 2.6 He had chest pain yesterday-had EKG with no acute changes  Assessment & Plan:  Shortness of breath, leg swelling/Anasarca, progressively worsening x 1 month Volume overload Acute combined systolic/diastolic CHF superimposed on baseline renal insufficiency and hypoalbuminemia  Echo showed EF of 40%, grade 3 DD, moderate to severe left ventricular hypertrophy. 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  Cardiology and nephrology following closely needs aggressive diuresis Lasix 120 mg q6 w/ metolazone 5 mg bid and IV albumin, defer to nephrology.  Good UOP 4200 ml.continue to monitor I's/O, Daily weight, renal function.  Net  negative with improving weight 10 kg loss since admission.  Net IO Since Admission: -2,297.73 mL [03/01/21 1350]  Filed Weights   02/27/21 0807 02/28/21 0500 03/01/21 0421  Weight: 136.1 kg 127.3 kg 126.3 kg    Intake/Output Summary (Last 24 hours) at 03/01/2021 1351 Last data filed at 03/01/2021 1025 Gross per 24 hour  Intake 932 ml  Output 4200 ml  Net -3268 ml    Accelerated hypertension, bp 225/176 in ED. increasing hydralazine to 7 5 mg  q6. Continue on Coreg,  and Imdur.  Status post clonidine x1  8/27 am.  Continue to optimize meds to get better control of the blood pressure  DM2 with neuropathy/retinopathy, HbA1c 8.6, on sliding scale here.  Blood sugar controlled Recent Labs  Lab 02/28/21 0751 02/28/21 1138 02/28/21 2120 03/01/21 0730 03/01/21 1131  GLUCAP 143* 178* 142* 135* 179*    CKD IV: Creatinine stable 2.4, but slightly uptrending, nephrology on board Recent Labs    12/19/20 1639 12/20/20 0609 12/21/20 0034 12/22/20 0129 01/21/21 1438 02/11/21 1740 02/17/21 0600 02/27/21 0622 02/28/21 0536 03/01/21 0517  BUN 120* 120* 120* 117* 42* 44* 44* 51* 54* 53*  CREATININE 3.37* 3.26* 3.32* 3.26* 2.56* 2.44* 2.53* 2.64*  2.50* 2.48* 2.67*    Normocytic anemia-anemia remained stable, monitor. Recent Labs  Lab 02/27/21 0622 02/28/21 0536 03/01/21 0517  HGB 9.1* 8.7* 8.5*  HCT 30.1* 27.9* 27.6*   Somnolence at at times:since admission checked ABG but has no acute finding.  Monitor.  Tobacco abuse-counseling done by me.  S/P BKA left 12/2020-continue supportive care, he is wheelchair-WC bound  Dyslipidemia-cont sstatins  Class III morbid obesity BMI 41: Outpatient follow-up with PCP loss, healthy  lifestyle.  Sleep apnea evaluation   Diet Order             Diet 2 gram sodium Room service appropriate? Yes; Fluid consistency: Thin  Diet effective now                   Patient's Body mass index is 41.12 kg/m.  DVT prophylaxis: heparin injection  5,000 Units Start: 02/27/21 0800 SCDs Start: 02/27/21 [id number] Code Status:   Code Status: Full Code   Family Communication: plan of care discussed with patient at bedside.  Status is: Inpatient Remains inpatient appropriate because:IV treatments appropriate due to intensity of illness or inability to take PO and Inpatient level of care appropriate due to severity of illness Dispo: The patient is from: Home              Anticipated d/c is to: Home              Patient currently is not medically stable to d/c.   Difficult to place patient No Unresulted Labs (From admission, onward)     Start     Ordered   03/01/21 0500  CBC  Daily,   R      02/28/21 1003   02/28/21 [id number]  Basic metabolic panel  Daily,   R      02/27/21 0753   02/27/21 1657  Creatinine, urine, random  Once,   R        02/27/21 1656   02/27/21 1657  Sodium, urine, random  Once,   R        02/27/21 1656           Medications reviewed:  Scheduled Meds:  atorvastatin  20 mg Oral Daily   carvedilol  25 mg Oral BID WC   ferrous sulfate  325 mg Oral TID WC   gabapentin  300 mg Oral BID   heparin  5,000 Units Subcutaneous Q8H   hydrALAZINE  75 mg Oral Q6H   insulin aspart  0-15 Units Subcutaneous TID WC   insulin aspart  0-5 Units Subcutaneous QHS   isosorbide mononitrate  30 mg Oral Daily   metolazone  5 mg Oral BID   potassium chloride  10 mEq Oral BID   sodium chloride flush  3 mL Intravenous Q12H   Continuous Infusions:  sodium chloride     furosemide 120 mg (03/01/21 0756)   Consultants:see note  Procedures:see note Antimicrobials: Anti-infectives (From admission, onward)    None      Culture/Microbiology    Component Value Date/Time   SDES URINE, RANDOM 12/21/2020 0727   SPECREQUEST  12/21/2020 0727    NONE Performed at [name], [address] [place], [place] [address]    CULT (A) 12/21/2020 0727    30,000 COLONIES/mL MULTIPLE SPECIES PRESENT, SUGGEST RECOLLECTION   REPTSTATUS  12/22/2020 FINAL 12/21/2020 0727    Other culture-see note  Objective: Vitals: Today's Vitals   03/01/21 0632 03/01/21 0735 03/01/21 1314 03/01/21 1314  BP:  (!) 155/96 (!) 177/110   Pulse:  76 79   Resp:  19 20   Temp:  97.9 F (36.6 C)  98.3 F (36.8 C)  TempSrc:  Oral  Oral  SpO2:  99%    Weight:      Height:      PainSc: 7        Intake/Output Summary (Last 24 hours) at 03/01/2021 1350 Last data filed at 03/01/2021 1025 Gross per 24 hour  Intake 1052 ml  Output 4200 ml  Net -3148 ml   Filed Weights   02/27/21 0807 02/28/21 0500 03/01/21 0421  Weight: 136.1 kg 127.3 kg 126.3 kg   Weight change: -9.779 kg  Intake/Output from previous day: 08/27 0701 - 08/28 0700 In: 1402.3 [P.O.:1080; IV Piggyback:322.3] Out: 3500 [Urine:3500] Intake/Output this shift: Total I/O In: 120 [P.O.:120] Out: 700 [Urine:700] Filed Weights   02/27/21 0807 02/28/21 0500 03/01/21 0421  Weight: 136.1 kg 127.3 kg 126.3 kg   Examination: General exam: AAOx 3, blind, HEENT:Oral mucosa moist, Ear/Nose WNL grossly, dentition normal. Respiratory system: bilaterally diminished,  no use of accessory muscle Cardiovascular system: S1 & S2 +, No JVD,. Gastrointestinal system: Abdomen soft, NT,ND, BS+ Nervous System:Alert, awake, moving extremities and grossly nonfocal Extremities: Leg leg edematous edema up to abdomen, on b/l thigh, lt BKA statuns Skin: No rashes,no icterus. MSK: Normal muscle bulk,tone, power   Data Reviewed: I have personally reviewed following labs and imaging studies CBC: Recent Labs  Lab 02/27/21 0622 02/28/21 0536 03/01/21 0517  WBC 7.2 7.0 6.1  NEUTROABS 5.6  --   --   HGB 9.1* 8.7* 8.5*  HCT 30.1* 27.9* 27.6*  MCV 95.0 90.6 91.1  PLT 218 229 [id number]   Basic Metabolic Panel: Recent Labs  Lab 02/27/21 0622 02/28/21 0536 03/01/21 0517  NA 141 146* 143  K 3.8 4.0 4.0  CL 113* 117* 113*  CO2 22 21* 23  GLUCOSE 225* 146* 156*  BUN 51* 54* 53*  CREATININE  2.64*  2.50* 2.48* 2.67*  CALCIUM 8.0* 8.4* 8.2*  MG 1.8 1.9  --    GFR: Estimated Creatinine Clearance: 44.5 mL/min (A) (by C-G formula based on SCr of 2.67 mg/dL (H)). Liver Function Tests: Recent Labs  Lab 02/27/21 0622  AST 26  ALT 32  ALKPHOS 344*  BILITOT 1.2  PROT 6.5  ALBUMIN 2.0*   No results for input(s): LIPASE, AMYLASE in the last 168 hours. No results for input(s): AMMONIA in the last 168 hours. Coagulation Profile: No results for input(s): INR, PROTIME in the last 168 hours. Cardiac Enzymes: No results for input(s): CKTOTAL, CKMB, CKMBINDEX, TROPONINI in the last 168 hours. BNP (last 3 results) No results for input(s): PROBNP in the last 8760 hours. HbA1C: Recent Labs    02/27/21 0840  HGBA1C 7.2*   CBG: Recent Labs  Lab 02/28/21 0751 02/28/21 1138 02/28/21 2120 03/01/21 0730 03/01/21 1131  GLUCAP 143* 178* 142* 135* 179*   Lipid Profile: No results for input(s): CHOL, HDL, LDLCALC, TRIG, CHOLHDL, LDLDIRECT in the last 72 hours. Thyroid Function Tests: Recent Labs    02/27/21 0840  TSH 2.455   Anemia Panel: No results for input(s): VITAMINB12, FOLATE, FERRITIN, TIBC, IRON, RETICCTPCT in the last 72 hours. Sepsis Labs: No results for input(s): PROCALCITON, LATICACIDVEN in the last 168 hours.  Recent Results (from the past 240 hour(s))  Resp Panel by RT-PCR (Flu A&B, Covid) Nasopharyngeal Swab     Status: None   Collection Time: 02/27/21  5:26 AM   Specimen: Nasopharyngeal Swab; Nasopharyngeal(NP) swabs in vial transport medium  Result Value Ref Range Status   SARS Coronavirus 2 by RT PCR NEGATIVE NEGATIVE Final    Comment: (NOTE) SARS-CoV-2 target nucleic acids are NOT DETECTED.  The SARS-CoV-2 RNA is generally detectable in upper respiratory specimens during the acute phase of infection. The lowest concentration of SARS-CoV-2 viral copies this assay can detect is 138 copies/mL. A negative result does not preclude SARS-Cov-2 infection  and should not be used as the sole basis for treatment or other patient management decisions. A negative result may occur with  improper specimen collection/handling, submission of specimen other than nasopharyngeal swab, presence of viral mutation(s) within the areas targeted by this assay, and inadequate number of viral copies(<138 copies/mL). A negative result must be combined with clinical observations, patient history, and epidemiological information. The expected result is Negative.  Fact Sheet for Patients:  EntrepreneurPulse.com.au  Fact Sheet for Healthcare Providers:  IncredibleEmployment.be  This test is no t yet approved or cleared by the [place] FDA and  has been authorized for detection and/or diagnosis of SARS-CoV-2 by FDA under an Emergency Use Authorization (EUA). This EUA will remain  in effect (meaning this test can be used) for the duration of the COVID-19 declaration under Section 564(b)(1) of the Act, 21 U.S.C.section 360bbb-3(b)(1), unless the authorization is terminated  or revoked sooner.       Influenza A by PCR NEGATIVE NEGATIVE Final   Influenza B by PCR NEGATIVE NEGATIVE Final    Comment: (NOTE) The Xpert Xpress SARS-CoV-2/FLU/RSV plus assay is intended as an aid in the diagnosis of influenza from Nasopharyngeal swab specimens and should not be used as a sole basis for treatment. Nasal washings and aspirates are unacceptable for Xpert Xpress SARS-CoV-2/FLU/RSV testing.  Fact Sheet for Patients: EntrepreneurPulse.com.au  Fact Sheet for Healthcare Providers: IncredibleEmployment.be  This test is not yet approved or cleared by the [place] FDA and has been authorized for detection and/or diagnosis of SARS-CoV-2 by FDA under an Emergency Use Authorization (EUA). This EUA will remain in effect (meaning this test can be used) for the duration of the COVID-19 declaration  under Section 564(b)(1) of the Act, 21 U.S.C. section 360bbb-3(b)(1), unless the authorization is terminated or revoked.  Performed at [facility], [place] [address], [place], [place] [address]      Radiology Studies: US RENAL  Result Date: 02/27/2021 CLINICAL DATA:  Chronic renal disease.  Stage IV EXAM: RENAL / URINARY TRACT ULTRASOUND COMPLETE COMPARISON:  X-ray abdomen 12/16/2020, ultrasound renal 12/11/2020, CT abdomen pelvis 09/07/2014 FINDINGS: Right Kidney: Renal measurements: 12.8 x 5.7 x 5.8 cm = volume: 223 mL. Echogenicity increased. No mass or hydronephrosis visualized. Left Kidney: Renal measurements: 13.2 x 7.2 x 6.4 cm = volume: 317 mL. Echogenicity increased. No mass or hydronephrosis visualized. Urinary bladder: Appears normal for degree of bladder distention. Other: At least small to moderate volume free fluid ascites. RIght pleural effusion. IMPRESSION: 1. Increased echogenicity of bilateral kidneys consistent with renal parenchymal disease. 2. At least small moderate volume simple free fluid ascites. 3. Right pleural effusion. Electronically Signed   By: Iven Finn M.D.   On: 02/27/2021 19:22   ECHOCARDIOGRAM COMPLETE  Result Date: 02/27/2021    ECHOCARDIOGRAM REPORT   Patient Name:   Todd Mccoy Date of Exam: 02/27/2021 Medical Rec #:  [id number]            Height:       69.0 in Accession #:    [id number]           Weight:       300.0 lb Date of Birth:  05/19/1971            BSA:          2.455 m Patient Age:    50 years             BP:  208/142 mmHg Patient Gender: M                    HR:           75 bpm. Exam Location:  Inpatient Procedure: 2D Echo, Cardiac Doppler and Color Doppler  Reported to: Dr Cherlynn Kaiser on 02/27/2021 2:15:00 PM. Indications:    CHF  History:        Patient has no prior history of Echocardiogram examinations.                 Risk Factors:Hypertension and Diabetes.  Sonographer:    Luisa Hart RDCS Referring  Phys: [id number] Mckinley Jewel  Sonographer Comments: Patient is morbidly obese. IMPRESSIONS  1. Left ventricular ejection fraction, by estimation, is 40%. The left ventricle has moderately decreased function. The left ventricle demonstrates global hypokinesis. There is moderate-severe left ventricular hypertrophy. Left ventricular diastolic parameters are consistent with Grade III diastolic dysfunction (restrictive).  2. Right ventricular systolic function is moderately reduced. The right ventricular size is mildly enlarged. Moderately increased right ventricular wall thickness. There is moderately elevated pulmonary artery systolic pressure. The estimated right ventricular systolic pressure is [id number] mmHg.  3. Left atrial size was moderately dilated.  4. A small pericardial effusion is present. The pericardial effusion is circumferential. There is no evidence of cardiac tamponade. Large pleural effusion.  5. The mitral valve is grossly normal. Trivial mitral valve regurgitation. No evidence of mitral stenosis.  6. Tricuspid valve regurgitation is moderate.  7. The aortic valve is tricuspid. Aortic valve regurgitation is not visualized. No aortic stenosis is present.  8. The inferior vena cava is normal in size with <50% respiratory variability, suggesting right atrial pressure of 8 mmHg. FINDINGS  Left Ventricle: Left ventricular ejection fraction, by estimation, is 40%. The left ventricle has moderately decreased function. The left ventricle demonstrates global hypokinesis. The left ventricular internal cavity size was normal in size. There is moderate-severe left ventricular hypertrophy. Left ventricular diastolic parameters are consistent with Grade III diastolic dysfunction (restrictive). Right Ventricle: The right ventricular size is mildly enlarged. Moderately increased right ventricular wall thickness. Right ventricular systolic function is moderately reduced. There is moderately elevated pulmonary artery  systolic pressure. The tricuspid regurgitant velocity is 3.56 m/s, and with an assumed right atrial pressure of 8 mmHg, the estimated right ventricular systolic pressure is [id number] mmHg. Left Atrium: Left atrial size was moderately dilated. Right Atrium: Right atrial size was normal in size. Pericardium: A small pericardial effusion is present. The pericardial effusion is circumferential. There is no evidence of cardiac tamponade. Mitral Valve: The mitral valve is grossly normal. Trivial mitral valve regurgitation. No evidence of mitral valve stenosis. MV peak gradient, 5.0 mmHg. The mean mitral valve gradient is 2.0 mmHg. Tricuspid Valve: The tricuspid valve is grossly normal. Tricuspid valve regurgitation is moderate. Aortic Valve: The aortic valve is tricuspid. Aortic valve regurgitation is not visualized. No aortic stenosis is present. Aortic valve mean gradient measures 2.0 mmHg. Aortic valve peak gradient measures 3.1 mmHg. Aortic valve area, by VTI measures 4.57 cm. Pulmonic Valve: The pulmonic valve was thickened with good excursion. Pulmonic valve regurgitation is mild. No evidence of pulmonic stenosis. Aorta: The aortic root is normal in size and structure. Venous: The inferior vena cava is normal in size with less than 50% respiratory variability, suggesting right atrial pressure of 8 mmHg. IAS/Shunts: No atrial level shunt detected by color flow Doppler. Additional Comments: There is a large pleural effusion. Mild ascites  is present.  LEFT VENTRICLE PLAX 2D LVIDd:         4.35 cm     Diastology LVIDs:         3.60 cm     LV e' medial:    2.70 cm/s LV PW:         1.40 cm     LV E/e' medial:  36.1 LV IVS:        1.65 cm     LV e' lateral:   5.70 cm/s LVOT diam:     2.40 cm     LV E/e' lateral: 17.1 LV SV:         83 LV SV Index:   34 LVOT Area:     4.52 cm  LV Volumes (MOD) LV vol d, MOD A2C: 48.3 ml LV vol d, MOD A4C: 68.2 ml LV vol s, MOD A2C: 37.9 ml LV vol s, MOD A4C: 62.8 ml LV SV MOD A2C:     10.4 ml  LV SV MOD A4C:     68.2 ml LV SV MOD BP:      9.3 ml RIGHT VENTRICLE RV Basal diam:  4.70 cm RV Mid diam:    3.80 cm RV S prime:     6.98 cm/s TAPSE (M-mode): 1.8 cm LEFT ATRIUM              Index       RIGHT ATRIUM           Index LA diam:        5.20 cm  2.12 cm/m  RA Area:     19.20 cm LA Vol (A2C):   85.4 ml  34.79 ml/m RA Volume:   53.70 ml  21.88 ml/m LA Vol (A4C):   118.0 ml 48.07 ml/m LA Biplane Vol: 104.0 ml 42.37 ml/m  AORTIC VALVE                   PULMONIC VALVE AV Area (Vmax):    4.65 cm    PV Vmax:          0.60 m/s AV Area (Vmean):   4.59 cm    PV Vmean:         40.700 cm/s AV Area (VTI):     4.57 cm    PV VTI:           0.130 m AV Vmax:           88.30 cm/s  PV Peak grad:     1.5 mmHg AV Vmean:          62.100 cm/s PV Mean grad:     1.0 mmHg AV VTI:            0.181 m     PR End Diast Vel: 13.25 msec AV Peak Grad:      3.1 mmHg AV Mean Grad:      2.0 mmHg LVOT Vmax:         90.70 cm/s LVOT Vmean:        63.000 cm/s LVOT VTI:          0.183 m LVOT/AV VTI ratio: 1.01  AORTA Ao Root diam: 3.10 cm Ao Asc diam:  2.80 cm MITRAL VALVE               TRICUSPID VALVE MV Area (PHT): 4.94 cm    TR Peak grad:   50.7 mmHg MV Area VTI:   3.16 cm    TR Vmax:  356.00 cm/s MV Peak grad:  5.0 mmHg MV Mean grad:  2.0 mmHg    SHUNTS MV Vmax:       1.12 m/s    Systemic VTI:  0.18 m MV Vmean:      70.5 cm/s   Systemic Diam: 2.40 cm MV Decel Time: 154 msec MV E velocity: 97.43 cm/s MV A velocity: 48.77 cm/s MV E/A ratio:  2.00 Cherlynn Kaiser MD Electronically signed by Cherlynn Kaiser MD Signature Date/Time: 02/27/2021/2:38:23 PM    Final    VAS US RENAL ARTERY DUPLEX  Result Date: 02/28/2021 ABDOMINAL VISCERAL Patient Name:  Todd Mccoy  Date of Exam:   02/28/2021 Medical Rec #: [id number]             Accession #:    [id number] Date of Birth: [birth year]             Patient Gender: M Patient Age:   55 years Exam Location:  [facility] Procedure:      VAS US RENAL ARTERY DUPLEX Referring  Phys: [id number] DAYNA N DUNN -------------------------------------------------------------------------------- Indications: Hypertension, CKD 4 Limitations: Air/bowel gas, obesity and limited ability to reposition. Comparison Study: 02-27-2021 US Renal showed increased echogenicity of bilateral                   kidneys. Performing Technologist: Darlin Coco RDMS, RVT  Examination Guidelines: A complete evaluation includes B-mode imaging, spectral Doppler, color Doppler, and power Doppler as needed of all accessible portions of each vessel. Bilateral testing is considered an integral part of a complete examination. Limited examinations for reoccurring indications may be performed as noted.  Duplex Findings: +----------------------+--------+--------+------+--------+ Mesenteric            PSV cm/sEDV cm/sPlaqueComments +----------------------+--------+--------+------+--------+ Aorta Mid                81                          +----------------------+--------+--------+------+--------+ Celiac Artery Proximal   86                          +----------------------+--------+--------+------+--------+ SMA Proximal            233                          +----------------------+--------+--------+------+--------+    +------------------+--------+--------+-------+ Right Renal ArteryPSV cm/sEDV cm/sComment +------------------+--------+--------+-------+ Origin               22      4            +------------------+--------+--------+-------+ Proximal             44      10           +------------------+--------+--------+-------+ Mid                  41      13           +------------------+--------+--------+-------+ Distal               40      10           +------------------+--------+--------+-------+ +-----------------+--------+--------+------------------------------------------+ Left Renal ArteryPSV cm/sEDV cm/s                 Comment                    +-----------------+--------+--------+------------------------------------------+  Origin                           Unable to insonate secondary to overlying                                       bowel gas and patient positioning      +-----------------+--------+--------+------------------------------------------+ Proximal                         Unable to insonate secondary to overlying                                       bowel gas and patient positioning      +-----------------+--------+--------+------------------------------------------+ Mid                 57      8                                               +-----------------+--------+--------+------------------------------------------+ Distal                           Unable to insonate secondary to overlying                                       bowel gas and patient positioning      +-----------------+--------+--------+------------------------------------------+  Technologist observations: Limited views of left kidney secondary to overlying bowel gas, free fluid in the abdomen, bowel loops, as well as limited ability of patient to reposition. +------------+--------+--------+----+-----------+--------+--------+----+ Right KidneyPSV cm/sEDV cm/sRI  Left KidneyPSV cm/sEDV cm/sRI   +------------+--------+--------+----+-----------+--------+--------+----+ Upper Pole  10      5       0.46Upper Pole 13      4       0.67 +------------+--------+--------+----+-----------+--------+--------+----+ Mid         10      4       0.56Mid        14      7       0.50 +------------+--------+--------+----+-----------+--------+--------+----+ Lower Pole  9       4       0.58Lower Pole 17      6       0.67 +------------+--------+--------+----+-----------+--------+--------+----+ Hilar       22      4       0.80Hilar      22      6       0.71  +------------+--------+--------+----+-----------+--------+--------+----+ +------------------+-----+------------------+----+ Right Kidney           Left Kidney            +------------------+-----+------------------+----+ RAR                    RAR                    +------------------+-----+------------------+----+ RAR (manual)      0.54 RAR (manual)      0.70 +------------------+-----+------------------+----+ Cortex  0.60 Cortex            0.64 +------------------+-----+------------------+----+ Cortex thickness       Corex thickness        +------------------+-----+------------------+----+ Kidney length (cm)11.97Kidney length (cm)     +------------------+-----+------------------+----+   Summary: Renal:  Right: Normal size right kidney. RRV flow present. Normal right        Resisitive Index. No evidence of right renal artery stenosis. Left:  Normal left Resistive Index. No evidence of stenosis in        visualized segment; however, views were extremely limited        during today's examination. Mesenteric: Normal Celiac artery and Superior Mesenteric artery findings.  *See table(s) above for measurements and observations.     Preliminary      LOS: 2 days   Antonieta Pert, MD [facility]  03/01/2021, 1:50 PM

## 2021-03-01 NOTE — Progress Notes (Signed)
Patient was complaining of chest pain, but was not radiating. Nurse got a 12-lead-EKG on patient. Patient's EKG is in shadow chart and in results. Patient BP also continues to be elevated but consistent.

## 2021-03-01 NOTE — Progress Notes (Addendum)
Progress Note  Patient Name: Todd Mccoy Date of Encounter: 03/01/2021  [facility] Cardiologist: None   Subjective   Had some chest pain early this am in setting of elevated BP and EKG with no acute changes  Inpatient Medications    Scheduled Meds:  atorvastatin  20 mg Oral Daily   carvedilol  25 mg Oral BID WC   ferrous sulfate  325 mg Oral TID WC   gabapentin  300 mg Oral BID   heparin  5,000 Units Subcutaneous Q8H   hydrALAZINE  50 mg Oral Q6H   insulin aspart  0-15 Units Subcutaneous TID WC   insulin aspart  0-5 Units Subcutaneous QHS   isosorbide mononitrate  30 mg Oral Daily   metolazone  5 mg Oral BID   potassium chloride  10 mEq Oral BID   sodium chloride flush  3 mL Intravenous Q12H   Continuous Infusions:  sodium chloride     albumin human 12.5 g (03/01/21 0612)   furosemide 120 mg (02/28/21 2345)   PRN Meds: sodium chloride, acetaminophen, hydrALAZINE, ondansetron (ZOFRAN) IV, oxyCODONE, polyethylene glycol, polyvinyl alcohol, sodium chloride flush   Vital Signs    Vitals:   03/01/21 0421 03/01/21 0610 03/01/21 0627 03/01/21 0735  BP:  (!) 165/102 (!) 169/105 (!) 155/96  Pulse:  75 73 76  Resp:  '20 20 19  '$ Temp:  98.3 F (36.8 C) 98 F (36.7 C) 97.9 F (36.6 C)  TempSrc:  Oral Oral Oral  SpO2:  100% 100% 99%  Weight: 126.3 kg     Height:        Intake/Output Summary (Last 24 hours) at 03/01/2021 0753 Last data filed at 03/01/2021 0644 Gross per 24 hour  Intake 1282.27 ml  Output 3500 ml  Net -2217.73 ml    Last 3 Weights 03/01/2021 02/28/2021 02/27/2021  Weight (lbs) 278 lb 7.1 oz 280 lb 10.3 oz 300 lb  Weight (kg) 126.3 kg 127.3 kg 136.079 kg      Telemetry    NSR - Personally Reviewed  ECG    NSR with no ST changes- Personally Reviewed  Physical Exam   GEN: Well nourished, well developed in no acute distress HEENT: Normal NECK: No JVD; No carotid bruits LYMPHATICS: No lymphadenopathy CARDIAC:RRR, no murmurs, rubs,  gallops RESPIRATORY:  Clear to auscultation without rales, wheezing or rhonchi  ABDOMEN: Soft, non-tender, non-distended MUSCULOSKELETAL:  No edema; No deformity  SKIN: Warm and dry NEUROLOGIC:  Alert and oriented x 3 PSYCHIATRIC:  Normal affect   Labs    High Sensitivity Troponin:  No results for input(s): TROPONINIHS in the last 720 hours.    Chemistry Recent Labs  Lab 02/27/21 0622 02/28/21 0536 03/01/21 0517  NA 141 146* 143  K 3.8 4.0 4.0  CL 113* 117* 113*  CO2 22 21* 23  GLUCOSE 225* 146* 156*  BUN 51* 54* 53*  CREATININE 2.64*  2.50* 2.48* 2.67*  CALCIUM 8.0* 8.4* 8.2*  PROT 6.5  --   --   ALBUMIN 2.0*  --   --   AST 26  --   --   ALT 32  --   --   ALKPHOS 344*  --   --   BILITOT 1.2  --   --   GFRNONAA 29*  31* 31* 29*  ANIONGAP '6 8 7      '$ Hematology Recent Labs  Lab 02/27/21 0622 02/28/21 0536 03/01/21 0517  WBC 7.2 7.0 6.1  RBC 3.17* 3.08*  3.03*  HGB 9.1* 8.7* 8.5*  HCT 30.1* 27.9* 27.6*  MCV 95.0 90.6 91.1  MCH 28.7 28.2 28.1  MCHC 30.2 31.2 30.8  RDW 19.4* 18.7* 18.7*  PLT 218 229 209     BNP Recent Labs  Lab 02/27/21 0622  BNP >4,500.0*      DDimer No results for input(s): DDIMER in the last 168 hours.  Radiology    US RENAL  Result Date: 02/27/2021 CLINICAL DATA:  Chronic renal disease.  Stage IV EXAM: RENAL / URINARY TRACT ULTRASOUND COMPLETE COMPARISON:  X-ray abdomen 12/16/2020, ultrasound renal 12/11/2020, CT abdomen pelvis 09/07/2014 FINDINGS: Right Kidney: Renal measurements: 12.8 x 5.7 x 5.8 cm = volume: 223 mL. Echogenicity increased. No mass or hydronephrosis visualized. Left Kidney: Renal measurements: 13.2 x 7.2 x 6.4 cm = volume: 317 mL. Echogenicity increased. No mass or hydronephrosis visualized. Urinary bladder: Appears normal for degree of bladder distention. Other: At least small to moderate volume free fluid ascites. RIght pleural effusion. IMPRESSION: 1. Increased echogenicity of bilateral kidneys consistent with  renal parenchymal disease. 2. At least small moderate volume simple free fluid ascites. 3. Right pleural effusion. Electronically Signed   By: Iven Finn M.D.   On: 02/27/2021 19:22   ECHOCARDIOGRAM COMPLETE  Result Date: 02/27/2021    ECHOCARDIOGRAM REPORT   Patient Name:   Todd Mccoy Date of Exam: 02/27/2021 Medical Rec #:  [id number]            Height:       69.0 in Accession #:    [id number]           Weight:       300.0 lb Date of Birth:  1970-09-03            BSA:          2.455 m Patient Age:    50 years             BP:           208/142 mmHg Patient Gender: M                    HR:           75 bpm. Exam Location:  Inpatient Procedure: 2D Echo, Cardiac Doppler and Color Doppler  Reported to: Dr Cherlynn Kaiser on 02/27/2021 2:15:00 PM. Indications:    CHF  History:        Patient has no prior history of Echocardiogram examinations.                 Risk Factors:Hypertension and Diabetes.  Sonographer:    Luisa Hart RDCS Referring Phys: [id number] Mckinley Jewel  Sonographer Comments: Patient is morbidly obese. IMPRESSIONS  1. Left ventricular ejection fraction, by estimation, is 40%. The left ventricle has moderately decreased function. The left ventricle demonstrates global hypokinesis. There is moderate-severe left ventricular hypertrophy. Left ventricular diastolic parameters are consistent with Grade III diastolic dysfunction (restrictive).  2. Right ventricular systolic function is moderately reduced. The right ventricular size is mildly enlarged. Moderately increased right ventricular wall thickness. There is moderately elevated pulmonary artery systolic pressure. The estimated right ventricular systolic pressure is [id number] mmHg.  3. Left atrial size was moderately dilated.  4. A small pericardial effusion is present. The pericardial effusion is circumferential. There is no evidence of cardiac tamponade. Large pleural effusion.  5. The mitral valve is grossly normal. Trivial mitral valve  regurgitation. No evidence of mitral stenosis.  6. Tricuspid  valve regurgitation is moderate.  7. The aortic valve is tricuspid. Aortic valve regurgitation is not visualized. No aortic stenosis is present.  8. The inferior vena cava is normal in size with <50% respiratory variability, suggesting right atrial pressure of 8 mmHg. FINDINGS  Left Ventricle: Left ventricular ejection fraction, by estimation, is 40%. The left ventricle has moderately decreased function. The left ventricle demonstrates global hypokinesis. The left ventricular internal cavity size was normal in size. There is moderate-severe left ventricular hypertrophy. Left ventricular diastolic parameters are consistent with Grade III diastolic dysfunction (restrictive). Right Ventricle: The right ventricular size is mildly enlarged. Moderately increased right ventricular wall thickness. Right ventricular systolic function is moderately reduced. There is moderately elevated pulmonary artery systolic pressure. The tricuspid regurgitant velocity is 3.56 m/s, and with an assumed right atrial pressure of 8 mmHg, the estimated right ventricular systolic pressure is [id number] mmHg. Left Atrium: Left atrial size was moderately dilated. Right Atrium: Right atrial size was normal in size. Pericardium: A small pericardial effusion is present. The pericardial effusion is circumferential. There is no evidence of cardiac tamponade. Mitral Valve: The mitral valve is grossly normal. Trivial mitral valve regurgitation. No evidence of mitral valve stenosis. MV peak gradient, 5.0 mmHg. The mean mitral valve gradient is 2.0 mmHg. Tricuspid Valve: The tricuspid valve is grossly normal. Tricuspid valve regurgitation is moderate. Aortic Valve: The aortic valve is tricuspid. Aortic valve regurgitation is not visualized. No aortic stenosis is present. Aortic valve mean gradient measures 2.0 mmHg. Aortic valve peak gradient measures 3.1 mmHg. Aortic valve area, by VTI measures 4.57  cm. Pulmonic Valve: The pulmonic valve was thickened with good excursion. Pulmonic valve regurgitation is mild. No evidence of pulmonic stenosis. Aorta: The aortic root is normal in size and structure. Venous: The inferior vena cava is normal in size with less than 50% respiratory variability, suggesting right atrial pressure of 8 mmHg. IAS/Shunts: No atrial level shunt detected by color flow Doppler. Additional Comments: There is a large pleural effusion. Mild ascites is present.  LEFT VENTRICLE PLAX 2D LVIDd:         4.35 cm     Diastology LVIDs:         3.60 cm     LV e' medial:    2.70 cm/s LV PW:         1.40 cm     LV E/e' medial:  36.1 LV IVS:        1.65 cm     LV e' lateral:   5.70 cm/s LVOT diam:     2.40 cm     LV E/e' lateral: 17.1 LV SV:         83 LV SV Index:   34 LVOT Area:     4.52 cm  LV Volumes (MOD) LV vol d, MOD A2C: 48.3 ml LV vol d, MOD A4C: 68.2 ml LV vol s, MOD A2C: 37.9 ml LV vol s, MOD A4C: 62.8 ml LV SV MOD A2C:     10.4 ml LV SV MOD A4C:     68.2 ml LV SV MOD BP:      9.3 ml RIGHT VENTRICLE RV Basal diam:  4.70 cm RV Mid diam:    3.80 cm RV S prime:     6.98 cm/s TAPSE (M-mode): 1.8 cm LEFT ATRIUM              Index       RIGHT ATRIUM  Index LA diam:        5.20 cm  2.12 cm/m  RA Area:     19.20 cm LA Vol (A2C):   85.4 ml  34.79 ml/m RA Volume:   53.70 ml  21.88 ml/m LA Vol (A4C):   118.0 ml 48.07 ml/m LA Biplane Vol: 104.0 ml 42.37 ml/m  AORTIC VALVE                   PULMONIC VALVE AV Area (Vmax):    4.65 cm    PV Vmax:          0.60 m/s AV Area (Vmean):   4.59 cm    PV Vmean:         40.700 cm/s AV Area (VTI):     4.57 cm    PV VTI:           0.130 m AV Vmax:           88.30 cm/s  PV Peak grad:     1.5 mmHg AV Vmean:          62.100 cm/s PV Mean grad:     1.0 mmHg AV VTI:            0.181 m     PR End Diast Vel: 13.25 msec AV Peak Grad:      3.1 mmHg AV Mean Grad:      2.0 mmHg LVOT Vmax:         90.70 cm/s LVOT Vmean:        63.000 cm/s LVOT VTI:          0.183 m  LVOT/AV VTI ratio: 1.01  AORTA Ao Root diam: 3.10 cm Ao Asc diam:  2.80 cm MITRAL VALVE               TRICUSPID VALVE MV Area (PHT): 4.94 cm    TR Peak grad:   50.7 mmHg MV Area VTI:   3.16 cm    TR Vmax:        356.00 cm/s MV Peak grad:  5.0 mmHg MV Mean grad:  2.0 mmHg    SHUNTS MV Vmax:       1.12 m/s    Systemic VTI:  0.18 m MV Vmean:      70.5 cm/s   Systemic Diam: 2.40 cm MV Decel Time: 154 msec MV E velocity: 97.43 cm/s MV A velocity: 48.77 cm/s MV E/A ratio:  2.00 Cherlynn Kaiser MD Electronically signed by Cherlynn Kaiser MD Signature Date/Time: 02/27/2021/2:38:23 PM    Final    VAS US RENAL ARTERY DUPLEX  Result Date: 02/28/2021 ABDOMINAL VISCERAL Patient Name:  Todd Mccoy  Date of Exam:   02/28/2021 Medical Rec #: [id number]             Accession #:    [id number] Date of Birth: September 30, 1970             Patient Gender: M Patient Age:   57 years Exam Location:  [facility] Procedure:      VAS US RENAL ARTERY DUPLEX Referring Phys: [id number] DAYNA N DUNN -------------------------------------------------------------------------------- Indications: Hypertension, CKD 4 Limitations: Air/bowel gas, obesity and limited ability to reposition. Comparison Study: 02-27-2021 US Renal showed increased echogenicity of bilateral                   kidneys. Performing Technologist: Darlin Coco RDMS, RVT  Examination Guidelines: A complete evaluation includes B-mode imaging, spectral Doppler, color Doppler, and power  Doppler as needed of all accessible portions of each vessel. Bilateral testing is considered an integral part of a complete examination. Limited examinations for reoccurring indications may be performed as noted.  Duplex Findings: +----------------------+--------+--------+------+--------+ Mesenteric            PSV cm/sEDV cm/sPlaqueComments +----------------------+--------+--------+------+--------+ Aorta Mid                81                           +----------------------+--------+--------+------+--------+ Celiac Artery Proximal   86                          +----------------------+--------+--------+------+--------+ SMA Proximal            233                          +----------------------+--------+--------+------+--------+    +------------------+--------+--------+-------+ Right Renal ArteryPSV cm/sEDV cm/sComment +------------------+--------+--------+-------+ Origin               22      4            +------------------+--------+--------+-------+ Proximal             44      10           +------------------+--------+--------+-------+ Mid                  41      13           +------------------+--------+--------+-------+ Distal               40      10           +------------------+--------+--------+-------+ +-----------------+--------+--------+------------------------------------------+ Left Renal ArteryPSV cm/sEDV cm/s                 Comment                   +-----------------+--------+--------+------------------------------------------+ Origin                           Unable to insonate secondary to overlying                                       bowel gas and patient positioning      +-----------------+--------+--------+------------------------------------------+ Proximal                         Unable to insonate secondary to overlying                                       bowel gas and patient positioning      +-----------------+--------+--------+------------------------------------------+ Mid                 57      8                                               +-----------------+--------+--------+------------------------------------------+ Distal  Unable to insonate secondary to overlying                                       bowel gas and patient positioning      +-----------------+--------+--------+------------------------------------------+   Technologist observations: Limited views of left kidney secondary to overlying bowel gas, free fluid in the abdomen, bowel loops, as well as limited ability of patient to reposition. +------------+--------+--------+----+-----------+--------+--------+----+ Right KidneyPSV cm/sEDV cm/sRI  Left KidneyPSV cm/sEDV cm/sRI   +------------+--------+--------+----+-----------+--------+--------+----+ Upper Pole  10      5       0.46Upper Pole 13      4       0.67 +------------+--------+--------+----+-----------+--------+--------+----+ Mid         10      4       0.56Mid        14      7       0.50 +------------+--------+--------+----+-----------+--------+--------+----+ Lower Pole  9       4       0.58Lower Pole 17      6       0.67 +------------+--------+--------+----+-----------+--------+--------+----+ Hilar       22      4       0.80Hilar      22      6       0.71 +------------+--------+--------+----+-----------+--------+--------+----+ +------------------+-----+------------------+----+ Right Kidney           Left Kidney            +------------------+-----+------------------+----+ RAR                    RAR                    +------------------+-----+------------------+----+ RAR (manual)      0.54 RAR (manual)      0.70 +------------------+-----+------------------+----+ Cortex            0.60 Cortex            0.64 +------------------+-----+------------------+----+ Cortex thickness       Corex thickness        +------------------+-----+------------------+----+ Kidney length (cm)11.97Kidney length (cm)     +------------------+-----+------------------+----+   Summary: Renal:  Right: Normal size right kidney. RRV flow present. Normal right        Resisitive Index. No evidence of right renal artery stenosis. Left:  Normal left Resistive Index. No evidence of stenosis in        visualized segment; however, views were extremely limited        during  today's examination. Mesenteric: Normal Celiac artery and Superior Mesenteric artery findings.  *See table(s) above for measurements and observations.     Preliminary     Cardiac Studies   2D echo 02/27/2021 IMPRESSIONS    1. Left ventricular ejection fraction, by estimation, is 40%. The left  ventricle has moderately decreased function. The left ventricle  demonstrates global hypokinesis. There is moderate-severe left ventricular  hypertrophy. Left ventricular diastolic  parameters are consistent with Grade III diastolic dysfunction  (restrictive).   2. Right ventricular systolic function is moderately reduced. The right  ventricular size is mildly enlarged. Moderately increased right  ventricular wall thickness. There is moderately elevated pulmonary artery  systolic pressure. The estimated right  ventricular systolic pressure is [id number] mmHg.   3. Left atrial size was moderately dilated.   4. A small pericardial effusion  is present. The pericardial effusion is  circumferential. There is no evidence of cardiac tamponade. Large pleural  effusion.   5. The mitral valve is grossly normal. Trivial mitral valve  regurgitation. No evidence of mitral stenosis.   6. Tricuspid valve regurgitation is moderate.   7. The aortic valve is tricuspid. Aortic valve regurgitation is not  visualized. No aortic stenosis is present.   8. The inferior vena cava is normal in size with <50% respiratory  variability, suggesting right atrial pressure of 8 mmHg.   Patient Profile     50 y.o. 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 Doristine Bosworth.  Assessment & Plan    Anasarca/massive volume overload/Acute combined systolic/diastolic CHF superimposed on baseline renal  insufficiency with hypoalbuminemia - given BNP, history and clinical presentation there is great concern for severe congestive heart failure - also confounding the issue is is poorly controlled HTN and advanced kidney dysfunction with possible component of nephrotic issue as evidenced by low albumin - 2D echo showed mild biventricular dysfunction with EF 40% and moderate RV dysfunction with moderate PHTN with large pleural effusion and small pericardial effusion, moderate TR and elevated RAP at 26mHg and G3DD  - 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  Not a cath or coronary CTA candidate at this time due to AKI on CKD - currently on IV Lasix and albumin for nephrotic syndrome - he put out 3.5L yesterday and is net neg 1.8L since admit - weight down 22lbs from admit - SCr remains elevated 2.64>2.48>2.67 - strict I/o's, daily weights - diuretics per nephrology - needs aggressive control of BP - continue Carvedilol '25mg'$  BID - no ACE/ARB/ARNi/MRN in setting of AKI on CKD - increase Hydralazine to '75mg'$  qid  Chest pain -had atypical right sided CP early this am with normal EKG -currently pain free -will need ischemic workup at some point   CKD stage IV  - d/w IM, and  nephrology consulting for probable nephrotic syndrome - follow    Uncontrolled HTN - BP still elevated but improved at 155/926mg but as high as 177/10336m - continue carvedilol '25mg'$  BID and Imdur '30mg'$  daily - losartan on hold while actively diuresing in setting of AKI on CKD - increase hydralazine to '75mg'$  QID and titrate as needed  - TSH wnl - prelim renal artery duplex with no RAS - consider OP sleep study   Tobacco abuse - cessation will be important for recovery   Morbid obesity - consider OP sleep study   DM - per primary team    Chronic anemia - per primary team - Hg 9.1, slightly higher than previous values     I have spent a total of 35 minutes with patient reviewing 2D echo, Renal consult , telemetry, EKGs, labs and examining patient as well as establishing an assessment and plan that was discussed with the patient.  > 50% of time was spent in direct patient care.     For questions or updates, please contact [facility] consult [email or web address] for contact info under  Signed, Fransico Him, MD  03/01/2021, 7:53 AM

## 2021-03-01 NOTE — Progress Notes (Signed)
Pharmacist Heart Failure Core Measure Documentation  Assessment: Todd Mccoy has an EF documented as 40% on 02/27/21 by ECHO.  Rationale: Heart failure patients with left ventricular systolic dysfunction (LVSD) and an EF < 40% should be prescribed an angiotensin converting enzyme inhibitor (ACEI) or angiotensin receptor blocker (ARB) at discharge unless a contraindication is documented in the medical record.  This patient is not currently on an ACEI or ARB for HF.  This note is being placed in the record in order to provide documentation that a contraindication to the use of these agents is present for this encounter.  ACE Inhibitor or Angiotensin Receptor Blocker is contraindicated (specify all that apply)  '[]'$   ACEI allergy AND ARB allergy '[]'$   Angioedema '[]'$   Moderate or severe aortic stenosis '[]'$   Hyperkalemia '[]'$   Hypotension '[]'$   Renal artery stenosis '[x]'$   Worsening renal function, preexisting renal disease or dysfunction: AKI on CKD   Dimple Nanas, PharmD 03/01/2021 2:22 PM

## 2021-03-02 DIAGNOSIS — N184 Chronic kidney disease, stage 4 (severe): Secondary | ICD-10-CM | POA: Diagnosis not present

## 2021-03-02 DIAGNOSIS — E8779 Other fluid overload: Secondary | ICD-10-CM | POA: Diagnosis not present

## 2021-03-02 DIAGNOSIS — R601 Generalized edema: Secondary | ICD-10-CM | POA: Diagnosis not present

## 2021-03-02 DIAGNOSIS — I129 Hypertensive chronic kidney disease with stage 1 through stage 4 chronic kidney disease, or unspecified chronic kidney disease: Secondary | ICD-10-CM | POA: Diagnosis not present

## 2021-03-02 DIAGNOSIS — E1129 Type 2 diabetes mellitus with other diabetic kidney complication: Secondary | ICD-10-CM | POA: Diagnosis not present

## 2021-03-02 DIAGNOSIS — I10 Essential (primary) hypertension: Secondary | ICD-10-CM | POA: Diagnosis not present

## 2021-03-02 DIAGNOSIS — I5041 Acute combined systolic (congestive) and diastolic (congestive) heart failure: Secondary | ICD-10-CM | POA: Diagnosis not present

## 2021-03-02 DIAGNOSIS — Z794 Long term (current) use of insulin: Secondary | ICD-10-CM | POA: Diagnosis not present

## 2021-03-02 DIAGNOSIS — D649 Anemia, unspecified: Secondary | ICD-10-CM | POA: Diagnosis not present

## 2021-03-02 LAB — BASIC METABOLIC PANEL
Anion gap: 8 (ref 5–15)
BUN: 58 mg/dL — ABNORMAL HIGH (ref 6–20)
CO2: 22 mmol/L (ref 22–32)
Calcium: 8.6 mg/dL — ABNORMAL LOW (ref 8.9–10.3)
Chloride: 112 mmol/L — ABNORMAL HIGH (ref 98–111)
Creatinine, Ser: 2.56 mg/dL — ABNORMAL HIGH (ref 0.61–1.24)
GFR, Estimated: 30 mL/min — ABNORMAL LOW (ref >60)
Glucose, Bld: 158 mg/dL — ABNORMAL HIGH (ref 70–99)
Potassium: 4.1 mmol/L (ref 3.5–5.1)
Sodium: 142 mmol/L (ref 135–145)

## 2021-03-02 LAB — GLUCOSE, CAPILLARY
Glucose-Capillary: 147 mg/dL — ABNORMAL HIGH (ref 70–99)
Glucose-Capillary: 184 mg/dL — ABNORMAL HIGH (ref 70–99)
Glucose-Capillary: 286 mg/dL — ABNORMAL HIGH (ref 70–99)
Glucose-Capillary: 226 mg/dL — ABNORMAL HIGH (ref 70–99)

## 2021-03-02 LAB — IRON AND TIBC
Iron: 28 ug/dL — ABNORMAL LOW (ref 45–182)
Saturation Ratios: 13 % — ABNORMAL LOW (ref 17.9–39.5)
TIBC: 214 ug/dL — ABNORMAL LOW (ref 250–450)
UIBC: 186 ug/dL

## 2021-03-02 NOTE — Progress Notes (Signed)
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 In ED: "Temperature 97.5, pulse 82, RR: 18, blood pressure 225/176, maintaining oxygen saturation on room air.  BNP more than 4500, 5 0, BUN 51, GFR: 31, H&H: 9.1/30.1, COVID-19 negative.  Chest x-ray shows progressive pulmonary edema and bilateral pleural effusion greater on right side.  Patient was given Lasix 80 IV once in ED.ther evaluation." Cardiology, nephrology consulted. He was placed on high-dose IV Lasix with IV albumin  Subjective: Seen and examined this morning.  Still has significant edema up to his abdomen.  Able to lay flat no shortness of breath or chest pain.  Reports he is urinating well.    Assessment & Plan:  Shortness of breath, leg swelling/Anasarca, progressively worsening x 1 month Volume overload Acute combined systolic/diastolic CHF superimposed on baseline renal insufficiency and hypoalbuminemia  Echo showed EF of 40%, grade 3 DD, moderate to severe left ventricular hypertrophy. 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  Cardiology and nephrology following closely, appreciate input continue with current aggressive diuresis Lasix 120 mg q6 w/ metolazone 5 mg bid/ s/p IV albumin x6 doses.Good UOP [id number].continue to monitor I's/O, Daily weight, renal function.  Significant weight loss as  below  Net IO Since Admission: -6,430.04 mL [03/02/21 1047]  Filed Weights   02/28/21 0500 03/01/21 0421 03/02/21 0135  Weight: 127.3 kg 126.3 kg 120.9 kg    Intake/Output Summary (Last 24 hours) at 03/02/2021 1047 Last data filed at 03/02/2021 0715 Gross per 24 hour  Intake 867.19 ml  Output 5050 ml  Net -4182.81 ml     Accelerated hypertension, bp 225/176 in ED. BP borderline controlled increasedg hydralazine to 7 5 mg  q6 8/28. Continue on Coreg,  and Imdur.  Status post clonidine x1  8/27 am.  Continue to optimize meds to get better control of the blood pressure  DM2 with neuropathy/retinopathy, HbA1c 8.6, blood sugar well controlled on sliding scale.  Recent Labs  Lab 03/01/21 1131 03/01/21 1724 03/01/21 2114 03/01/21 2248 03/02/21 0742  GLUCAP 179* 180* 142* 126* 147*     CKD IV: Creatinine stable 2.4, renal function remained stable.  Continue to monitor  Recent Labs    12/20/20 0609 12/21/20 0034 12/22/20 0129 01/21/21 1438 02/11/21 1740 02/17/21 0600 02/27/21 0622 02/28/21 0536 03/01/21 0517 03/02/21 0428  BUN 120* 120* 117* 42* 44* 44* 51* 54* 53* 58*  CREATININE 3.26* 3.32* 3.26* 2.56* 2.44* 2.53* 2.64*  2.50* 2.48* 2.67* 2.56*     Normocytic anemia-anemia remained stable, monitor.  Nephrology evaluating for ESA needs checking iron Recent Labs  Lab 02/27/21 0622 02/28/21 0536 03/01/21 0517  HGB 9.1* 8.7* 8.5*  HCT 30.1* 27.9* 27.6*    Somnolence at at times: It has resolved.  ABG no acute finding.    Tobacco abuse-counseling done by me.  S/P BKA left 12/2020-continue supportive care, he is wheelchair-WC bound  Dyslipidemia-cont sstatins  Class III morbid obesity BMI 41: Outpatient follow-up with PCP loss, healthy lifestyle.  Sleep apnea evaluation   Diet Order             Diet 2 gram sodium Room service appropriate? Yes; Fluid consistency: Thin  Diet effective now                   Patient's Body mass index is 39.36 kg/m.  DVT  prophylaxis: heparin injection 5,000 Units Start: 02/27/21 0800 SCDs Start: 02/27/21 [id number] Code Status:   Code Status: Full Code   Family Communication: plan of care discussed with patient at bedside.  Status is: Inpatient Remains inpatient appropriate because:IV treatments appropriate due to intensity of illness or inability to take PO and Inpatient level of care appropriate due to severity of illness Dispo: The patient is from: Home              Anticipated d/c is to: Home              Patient currently is not medically stable to d/c.   Difficult to place patient No Unresulted Labs (From admission, onward)     Start     Ordered   03/03/21 0500  CBC  Every 48 hours,   R      03/01/21 1751   03/03/21 0500  Renal function panel  Daily,   R      03/02/21 0814   03/02/21 0815  Parathyroid hormone, intact (no Ca)  Once,   R        03/02/21 0814   02/28/21 [id number]  Basic metabolic panel  Daily,   R      02/27/21 0753   02/27/21 1657  Creatinine, urine, random  Once,   R        02/27/21 1656   02/27/21 1657  Sodium, urine, random  Once,   R        02/27/21 1656           Medications reviewed:  Scheduled Meds:  atorvastatin  20 mg Oral Daily   carvedilol  25 mg Oral BID WC   ferrous sulfate  325 mg Oral TID WC   gabapentin  300 mg Oral BID   heparin  5,000 Units Subcutaneous Q8H   hydrALAZINE  75 mg Oral Q6H   insulin aspart  0-15 Units Subcutaneous TID WC   insulin aspart  0-5 Units Subcutaneous QHS   isosorbide mononitrate  30 mg Oral Daily   metolazone  5 mg Oral BID   potassium chloride  10 mEq Oral BID   sodium chloride flush  3 mL Intravenous Q12H   Continuous Infusions:  sodium chloride     furosemide 120 mg (03/02/21 0602)   Consultants:see note  Procedures:see note Antimicrobials: Anti-infectives (From admission, onward)    None      Culture/Microbiology    Component Value Date/Time   SDES URINE, RANDOM 12/21/2020 0727   SPECREQUEST  12/21/2020 0727     NONE Performed at [facility], [address], [place], [place] [address]    CULT (A) 12/21/2020 0727    30,000 COLONIES/mL MULTIPLE SPECIES PRESENT, SUGGEST RECOLLECTION   REPTSTATUS 12/22/2020 FINAL 12/21/2020 0727    Other culture-see note  Objective: Vitals: Today's Vitals   03/02/21 0135 03/02/21 0200 03/02/21 0509 03/02/21 0738  BP:  (!) 159/88 (!) 180/102 (!) 163/92  Pulse:   86   Resp:   19   Temp:   98  F (36.7 C)   TempSrc:   Oral   SpO2:   96%   Weight: 120.9 kg     Height:      PainSc:        Intake/Output Summary (Last 24 hours) at 03/02/2021 1047 Last data filed at 03/02/2021 0715 Gross per 24 hour  Intake 867.19 ml  Output 5050 ml  Net -4182.81 ml    Filed Weights   02/28/21 0500 03/01/21 0421 03/02/21 0135  Weight: 127.3 kg 126.3 kg 120.9 kg   Weight change: -5.4 kg  Intake/Output from previous day: 08/28 0701 - 08/29 0700 In: 1037.7 [P.O.:600; IV Piggyback:437.7] Out: 4850 [Urine:4850] Intake/Output this shift: Total I/O In: -  Out: 900 [Urine:900] Filed Weights   02/28/21 0500 03/01/21 0421 03/02/21 0135  Weight: 127.3 kg 126.3 kg 120.9 kg   Examination: General exam: AAOx3, blind,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 HEENT:Oral mucosa moist, Ear/Nose WNL grossly, dentition normal. Respiratory system: bilaterally diminished, no use of accessory muscle Cardiovascular system: S1 & S2 +, No JVD,. Gastrointestinal system: Abdomen soft,NT,ND, BS+ Nervous System:Alert, awake, moving extremities and grossly nonfocal Extremities: Left BKA, significant edema on the left leg and thigh and abdomen Skin: No rashes,no icterus. MSK: Normal muscle bulk,tone, power   Data Reviewed: I have personally reviewed following labs and imaging studies CBC: Recent Labs  Lab 02/27/21 0622 02/28/21 0536 03/01/21 0517  WBC 7.2 7.0 6.1  NEUTROABS 5.6  --   --   HGB 9.1* 8.7* 8.5*  HCT 30.1* 27.9* 27.6*  MCV 95.0 90.6 91.1  PLT 218 229 209    Basic  Metabolic Panel: Recent Labs  Lab 02/27/21 0622 02/28/21 0536 03/01/21 0517 03/02/21 0428  NA 141 146* 143 142  K 3.8 4.0 4.0 4.1  CL 113* 117* 113* 112*  CO2 22 21* 23 22  GLUCOSE 225* 146* 156* 158*  BUN 51* 54* 53* 58*  CREATININE 2.64*  2.50* 2.48* 2.67* 2.56*  CALCIUM 8.0* 8.4* 8.2* 8.6*  MG 1.8 1.9  --   --     GFR: Estimated Creatinine Clearance: 45.3 mL/min (A) (by C-G formula based on SCr of 2.56 mg/dL (H)). Liver Function Tests: Recent Labs  Lab 02/27/21 0622  AST 26  ALT 32  ALKPHOS 344*  BILITOT 1.2  PROT 6.5  ALBUMIN 2.0*    No results for input(s): LIPASE, AMYLASE in the last 168 hours. No results for input(s): AMMONIA in the last 168 hours. Coagulation Profile: No results for input(s): INR, PROTIME in the last 168 hours. Cardiac Enzymes: No results for input(s): CKTOTAL, CKMB, CKMBINDEX, TROPONINI in the last 168 hours. BNP (last 3 results) No results for input(s): PROBNP in the last 8760 hours. HbA1C: No results for input(s): HGBA1C in the last 72 hours.  CBG: Recent Labs  Lab 03/01/21 1131 03/01/21 1724 03/01/21 2114 03/01/21 2248 03/02/21 0742  GLUCAP 179* 180* 142* 126* 147*    Lipid Profile: No results for input(s): CHOL, HDL, LDLCALC, TRIG, CHOLHDL, LDLDIRECT in the last 72 hours. Thyroid Function Tests: No results for input(s): TSH, T4TOTAL, FREET4, T3FREE, THYROIDAB in the last 72 hours.  Anemia Panel: Recent Labs    03/02/21 0907  TIBC 214*  IRON 28*   Sepsis Labs: No results for input(s): PROCALCITON, LATICACIDVEN in the last 168 hours.  Recent Results (from the past 240 hour(s))  Resp Panel by RT-PCR (Flu A&B, Covid) Nasopharyngeal Swab     Status: None   Collection Time: 02/27/21  5:26 AM  Specimen: Nasopharyngeal Swab; Nasopharyngeal(NP) swabs in vial transport medium  Result Value Ref Range Status   SARS Coronavirus 2 by RT PCR NEGATIVE NEGATIVE Final    Comment: (NOTE) SARS-CoV-2 target nucleic acids are NOT  DETECTED.  The SARS-CoV-2 RNA is generally detectable in upper respiratory specimens during the acute phase of infection. The lowest concentration of SARS-CoV-2 viral copies this assay can detect is 138 copies/mL. A negative result does not preclude SARS-Cov-2 infection and should not be used as the sole basis for treatment or other patient management decisions. A negative result may occur with  improper specimen collection/handling, submission of specimen other than nasopharyngeal swab, presence of viral mutation(s) within the areas targeted by this assay, and inadequate number of viral copies(<138 copies/mL). A negative result must be combined with clinical observations, patient history, and epidemiological information. The expected result is Negative.  Fact Sheet for Patients:  EntrepreneurPulse.com.au  Fact Sheet for Healthcare Providers:  IncredibleEmployment.be  This test is no t yet approved or cleared by the [place] FDA and  has been authorized for detection and/or diagnosis of SARS-CoV-2 by FDA under an Emergency Use Authorization (EUA). This EUA will remain  in effect (meaning this test can be used) for the duration of the COVID-19 declaration under Section 564(b)(1) of the Act, 21 U.S.C.section 360bbb-3(b)(1), unless the authorization is terminated  or revoked sooner.       Influenza A by PCR NEGATIVE NEGATIVE Final   Influenza B by PCR NEGATIVE NEGATIVE Final    Comment: (NOTE) The Xpert Xpress SARS-CoV-2/FLU/RSV plus assay is intended as an aid in the diagnosis of influenza from Nasopharyngeal swab specimens and should not be used as a sole basis for treatment. Nasal washings and aspirates are unacceptable for Xpert Xpress SARS-CoV-2/FLU/RSV testing.  Fact Sheet for Patients: EntrepreneurPulse.com.au  Fact Sheet for Healthcare Providers: IncredibleEmployment.be  This test is not yet  approved or cleared by the [place] FDA and has been authorized for detection and/or diagnosis of SARS-CoV-2 by FDA under an Emergency Use Authorization (EUA). This EUA will remain in effect (meaning this test can be used) for the duration of the COVID-19 declaration under Section 564(b)(1) of the Act, 21 U.S.C. section 360bbb-3(b)(1), unless the authorization is terminated or revoked.  Performed at [facility], [place] [address], [place], [place] [address]       Radiology Studies: VAS US RENAL ARTERY DUPLEX  Result Date: 03/01/2021 ABDOMINAL VISCERAL Patient Name:  Todd Mccoy  Date of Exam:   02/28/2021 Medical Rec #: [id number]             Accession #:    [id number] Date of Birth: [birth year]             Patient Gender: M Patient Age:   8 years Exam Location:  [facility] Procedure:      VAS US RENAL ARTERY DUPLEX Referring Phys: [id number] DAYNA N DUNN -------------------------------------------------------------------------------- Indications: Hypertension, CKD 4 Limitations: Air/bowel gas, obesity and limited ability to reposition. Comparison Study: 02-27-2021 US Renal showed increased echogenicity of bilateral                   kidneys. Performing Technologist: Darlin Coco RDMS, RVT  Examination Guidelines: A complete evaluation includes B-mode imaging, spectral Doppler, color Doppler, and power Doppler as needed of all accessible portions of each vessel. Bilateral testing is considered an integral part of a complete examination. Limited examinations for reoccurring indications may be performed as noted.  Duplex Findings: +----------------------+--------+--------+------+--------+  Mesenteric            PSV cm/sEDV cm/sPlaqueComments +----------------------+--------+--------+------+--------+ Aorta Mid                81                          +----------------------+--------+--------+------+--------+ Celiac Artery Proximal   86                           +----------------------+--------+--------+------+--------+ SMA Proximal            233                          +----------------------+--------+--------+------+--------+    +------------------+--------+--------+-------+ Right Renal ArteryPSV cm/sEDV cm/sComment +------------------+--------+--------+-------+ Origin               22      4            +------------------+--------+--------+-------+ Proximal             44      10           +------------------+--------+--------+-------+ Mid                  41      13           +------------------+--------+--------+-------+ Distal               40      10           +------------------+--------+--------+-------+ +-----------------+--------+--------+------------------------------------------+ Left Renal ArteryPSV cm/sEDV cm/s                 Comment                   +-----------------+--------+--------+------------------------------------------+ Origin                           Unable to insonate secondary to overlying                                       bowel gas and patient positioning      +-----------------+--------+--------+------------------------------------------+ Proximal                         Unable to insonate secondary to overlying                                       bowel gas and patient positioning      +-----------------+--------+--------+------------------------------------------+ Mid                 57      8                                               +-----------------+--------+--------+------------------------------------------+ Distal                           Unable to insonate secondary to overlying  bowel gas and patient positioning      +-----------------+--------+--------+------------------------------------------+  Technologist observations: Limited views of left kidney secondary to overlying bowel gas, free fluid in the  abdomen, bowel loops, as well as limited ability of patient to reposition. +------------+--------+--------+----+-----------+--------+--------+----+ Right KidneyPSV cm/sEDV cm/sRI  Left KidneyPSV cm/sEDV cm/sRI   +------------+--------+--------+----+-----------+--------+--------+----+ Upper Pole  10      5       0.46Upper Pole 13      4       0.67 +------------+--------+--------+----+-----------+--------+--------+----+ Mid         10      4       0.56Mid        14      7       0.50 +------------+--------+--------+----+-----------+--------+--------+----+ Lower Pole  9       4       0.58Lower Pole 17      6       0.67 +------------+--------+--------+----+-----------+--------+--------+----+ Hilar       22      4       0.80Hilar      22      6       0.71 +------------+--------+--------+----+-----------+--------+--------+----+ +------------------+-----+------------------+----+ Right Kidney           Left Kidney            +------------------+-----+------------------+----+ RAR                    RAR                    +------------------+-----+------------------+----+ RAR (manual)      0.54 RAR (manual)      0.70 +------------------+-----+------------------+----+ Cortex            0.60 Cortex            0.64 +------------------+-----+------------------+----+ Cortex thickness       Corex thickness        +------------------+-----+------------------+----+ Kidney length (cm)11.97Kidney length (cm)     +------------------+-----+------------------+----+  Summary: Renal:  Right: Normal size right kidney. RRV flow present. Normal right        Resisitive Index. No evidence of right renal artery stenosis. Left:  Normal left Resistive Index. No evidence of stenosis in        visualized segment; however, views were extremely limited        during today's examination. Mesenteric: Normal Celiac artery and Superior Mesenteric artery findings.  *See table(s) above  for measurements and observations.  Diagnosing physician: Monica Martinez MD  Electronically signed by Monica Martinez MD on 03/01/2021 at 2:23:22 PM.    Final      LOS: 3 days   Antonieta Pert, MD [facility]  03/02/2021, 10:47 AM

## 2021-03-02 NOTE — Progress Notes (Addendum)
Progress Note  Patient Name: Todd Mccoy Date of Encounter: 03/02/2021  [facility] Cardiologist: Fransico Him, MD   Subjective   No chest pain and feeling better though still with fluid   Inpatient Medications    Scheduled Meds:  atorvastatin  20 mg Oral Daily   carvedilol  25 mg Oral BID WC   ferrous sulfate  325 mg Oral TID WC   gabapentin  300 mg Oral BID   heparin  5,000 Units Subcutaneous Q8H   hydrALAZINE  75 mg Oral Q6H   insulin aspart  0-15 Units Subcutaneous TID WC   insulin aspart  0-5 Units Subcutaneous QHS   isosorbide mononitrate  30 mg Oral Daily   metolazone  5 mg Oral BID   potassium chloride  10 mEq Oral BID   sodium chloride flush  3 mL Intravenous Q12H   Continuous Infusions:  sodium chloride     furosemide 120 mg (03/02/21 0602)   PRN Meds: sodium chloride, acetaminophen, hydrALAZINE, ondansetron (ZOFRAN) IV, oxyCODONE, polyethylene glycol, polyvinyl alcohol, sodium chloride flush   Vital Signs    Vitals:   03/02/21 0135 03/02/21 0200 03/02/21 0509 03/02/21 0738  BP:  (!) 159/88 (!) 180/102 (!) 163/92  Pulse:   86   Resp:   19   Temp:   98 F (36.7 C)   TempSrc:   Oral   SpO2:   96%   Weight: 120.9 kg     Height:        Intake/Output Summary (Last 24 hours) at 03/02/2021 0838 Last data filed at 03/02/2021 0715 Gross per 24 hour  Intake 495.69 ml  Output 5750 ml  Net -5254.31 ml   Last 3 Weights 03/02/2021 03/01/2021 02/28/2021  Weight (lbs) 266 lb 8.6 oz 278 lb 7.1 oz 280 lb 10.3 oz  Weight (kg) 120.9 kg 126.3 kg 127.3 kg      Telemetry    SR - Personally Reviewed  ECG    SR - Personally Reviewed  Physical Exam   GEN: No acute distress.   Neck: + JVD Cardiac: RRR, no murmurs, rubs, or gallops.  Respiratory: rales in bases to auscultation bilaterally. GI: Soft, nontender, non-distended + edema  MS: ++ edema; No deformity. Neuro:  Nonfocal  Psych: Normal affect   Labs    High Sensitivity Troponin:  No  results for input(s): TROPONINIHS in the last 720 hours.    Chemistry Recent Labs  Lab 02/27/21 0622 02/28/21 0536 03/01/21 0517 03/02/21 0428  NA 141 146* 143 142  K 3.8 4.0 4.0 4.1  CL 113* 117* 113* 112*  CO2 22 21* 23 22  GLUCOSE 225* 146* 156* 158*  BUN 51* 54* 53* 58*  CREATININE 2.64*  2.50* 2.48* 2.67* 2.56*  CALCIUM 8.0* 8.4* 8.2* 8.6*  PROT 6.5  --   --   --   ALBUMIN 2.0*  --   --   --   AST 26  --   --   --   ALT 32  --   --   --   ALKPHOS 344*  --   --   --   BILITOT 1.2  --   --   --   GFRNONAA 29*  31* 31* 29* 30*  ANIONGAP '6 8 7 8     '$ Hematology Recent Labs  Lab 02/27/21 0622 02/28/21 0536 03/01/21 0517  WBC 7.2 7.0 6.1  RBC 3.17* 3.08* 3.03*  HGB 9.1* 8.7* 8.5*  HCT 30.1* 27.9* 27.6*  MCV 95.0 90.6 91.1  MCH 28.7 28.2 28.1  MCHC 30.2 31.2 30.8  RDW 19.4* 18.7* 18.7*  PLT 218 229 209    BNP Recent Labs  Lab 02/27/21 0622  BNP >4,500.0*     DDimer No results for input(s): DDIMER in the last 168 hours.   Radiology    VAS US RENAL ARTERY DUPLEX  Result Date: 03/01/2021 ABDOMINAL VISCERAL Patient Name:  Todd Mccoy  Date of Exam:   02/28/2021 Medical Rec #: [id number]             Accession #:    [id number] Date of Birth: [birth year]             Patient Gender: M Patient Age:   50 years Exam Location:  [facility] Procedure:      VAS US RENAL ARTERY DUPLEX Referring Phys: [id number] DAYNA N DUNN -------------------------------------------------------------------------------- Indications: Hypertension, CKD 4 Limitations: Air/bowel gas, obesity and limited ability to reposition. Comparison Study: 02-27-2021 US Renal showed increased echogenicity of bilateral                   kidneys. Performing Technologist: Darlin Coco RDMS, RVT  Examination Guidelines: A complete evaluation includes B-mode imaging, spectral Doppler, color Doppler, and power Doppler as needed of all accessible portions of each vessel. Bilateral testing is considered an  integral part of a complete examination. Limited examinations for reoccurring indications may be performed as noted.  Duplex Findings: +----------------------+--------+--------+------+--------+ Mesenteric            PSV cm/sEDV cm/sPlaqueComments +----------------------+--------+--------+------+--------+ Aorta Mid                81                          +----------------------+--------+--------+------+--------+ Celiac Artery Proximal   86                          +----------------------+--------+--------+------+--------+ SMA Proximal            233                          +----------------------+--------+--------+------+--------+    +------------------+--------+--------+-------+ Right Renal ArteryPSV cm/sEDV cm/sComment +------------------+--------+--------+-------+ Origin               22      4            +------------------+--------+--------+-------+ Proximal             44      10           +------------------+--------+--------+-------+ Mid                  41      13           +------------------+--------+--------+-------+ Distal               40      10           +------------------+--------+--------+-------+ +-----------------+--------+--------+------------------------------------------+ Left Renal ArteryPSV cm/sEDV cm/s                 Comment                   +-----------------+--------+--------+------------------------------------------+ Origin                           Unable to insonate  secondary to overlying                                       bowel gas and patient positioning      +-----------------+--------+--------+------------------------------------------+ Proximal                         Unable to insonate secondary to overlying                                       bowel gas and patient positioning      +-----------------+--------+--------+------------------------------------------+ Mid                 57      8                                                +-----------------+--------+--------+------------------------------------------+ Distal                           Unable to insonate secondary to overlying                                       bowel gas and patient positioning      +-----------------+--------+--------+------------------------------------------+  Technologist observations: Limited views of left kidney secondary to overlying bowel gas, free fluid in the abdomen, bowel loops, as well as limited ability of patient to reposition. +------------+--------+--------+----+-----------+--------+--------+----+ Right KidneyPSV cm/sEDV cm/sRI  Left KidneyPSV cm/sEDV cm/sRI   +------------+--------+--------+----+-----------+--------+--------+----+ Upper Pole  10      5       0.46Upper Pole 13      4       0.67 +------------+--------+--------+----+-----------+--------+--------+----+ Mid         10      4       0.56Mid        14      7       0.50 +------------+--------+--------+----+-----------+--------+--------+----+ Lower Pole  9       4       0.58Lower Pole 17      6       0.67 +------------+--------+--------+----+-----------+--------+--------+----+ Hilar       22      4       0.80Hilar      22      6       0.71 +------------+--------+--------+----+-----------+--------+--------+----+ +------------------+-----+------------------+----+ Right Kidney           Left Kidney            +------------------+-----+------------------+----+ RAR                    RAR                    +------------------+-----+------------------+----+ RAR (manual)      0.54 RAR (manual)      0.70 +------------------+-----+------------------+----+ Cortex            0.60 Cortex            0.64 +------------------+-----+------------------+----+ Cortex thickness  Corex thickness        +------------------+-----+------------------+----+ Kidney length  (cm)11.97Kidney length (cm)     +------------------+-----+------------------+----+  Summary: Renal:  Right: Normal size right kidney. RRV flow present. Normal right        Resisitive Index. No evidence of right renal artery stenosis. Left:  Normal left Resistive Index. No evidence of stenosis in        visualized segment; however, views were extremely limited        during today's examination. Mesenteric: Normal Celiac artery and Superior Mesenteric artery findings.  *See table(s) above for measurements and observations.  Diagnosing physician: Monica Martinez MD  Electronically signed by Monica Martinez MD on 03/01/2021 at 2:23:22 PM.    Final     Cardiac Studies   2D echo 02/27/2021 IMPRESSIONS    1. Left ventricular ejection fraction, by estimation, is 40%. The left  ventricle has moderately decreased function. The left ventricle  demonstrates global hypokinesis. There is moderate-severe left ventricular  hypertrophy. Left ventricular diastolic  parameters are consistent with Grade III diastolic dysfunction  (restrictive).   2. Right ventricular systolic function is moderately reduced. The right  ventricular size is mildly enlarged. Moderately increased right  ventricular wall thickness. There is moderately elevated pulmonary artery  systolic pressure. The estimated right  ventricular systolic pressure is [id number] mmHg.   3. Left atrial size was moderately dilated.   4. A small pericardial effusion is present. The pericardial effusion is  circumferential. There is no evidence of cardiac tamponade. Large pleural  effusion.   5. The mitral valve is grossly normal. Trivial mitral valve  regurgitation. No evidence of mitral stenosis.   6. Tricuspid valve regurgitation is moderate.   7. The aortic valve is tricuspid. Aortic valve regurgitation is not  visualized. No aortic stenosis is present.   8. The inferior vena cava is normal in size with <50% respiratory  variability, suggesting  right atrial pressure of 8 mmHg.     Patient Profile     50 y.o. 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   Pt is Legally Blind  Assessment & Plan    Anasarca/massive volume overload/Acute combined systolic/diastolic CHF superimposed on baseline renal insufficiency with hypoalbuminemia - given BNP, history and clinical presentation there is great concern for severe congestive heart failure with combined systolic and diastolic HF though severe diastolic with moderate to severe LVH, and G3DD- also confounding the issue is is poorly controlled HTN and advanced kidney dysfunction with possible component of nephrotic issue as evidenced by low albumin - 2D echo showed mild biventricular dysfunction with EF 40% and moderate RV dysfunction with moderate PHTN with large pleural effusion and small pericardial effusion, moderate TR and elevated RAP at 106mHg and G3DD  - 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  Not a cath or coronary CTA candidate at this time due to AKI on CKD - currently on IV Lasix 120 every 8 hours and albumin for nephrotic syndrome - he put out 4.3L yesterday and is net neg 6,972 mL since admit - weight down 33.5 lbs from admit - SCr remains elevated 2.64>2.48>2.67>2.56 - strict I/o's, daily  weights - diuretics per nephrology (Lasix 120 mg eery 8 hours metolazone 5 mg BID)  - needs aggressive control of BP still elevated - continue Carvedilol '25mg'$  BID - no ACE/ARB/ARNi/MRN in setting of AKI on CKD - increased Hydralazine to '75mg'$  qid yesterday m   Chest pain -had atypical right sided CP early this am with normal  EKG -currently pain free -will need ischemic workup at some point   CKD stage IV  - d/w IM, and  nephrology consulting for probable nephrotic syndrome - follow    Uncontrolled HTN - BP still elevated but improved at times 159/88 mmHg but as high as 180/102 mmHg - continue carvedilol '25mg'$  BID and Imdur '30mg'$  daily - losartan on hold while actively diuresing in setting of AKI on CKD - increase hydralazine to '75mg'$  QID  - TSH wnl - prelim renal artery duplex with no RAS - consider OP sleep study   Tobacco abuse - cessation will be important for recovery   Morbid obesity - consider OP sleep study -BMI 39.36   DM - per primary team   Chronic anemia - per primary team - Hg 9.1, slightly higher than previous values     For questions or updates, please contact [facility] Please consult [email or web address] for contact info under        Signed, Cecilie Kicks, NP  03/02/2021, 8:38 AM    Patient seen and examined.  Agree with above documentation.  On exam, patient is alert and oriented, regular rate and rhythm, no murmurs, diminished breath sounds, +JVD, s/p LLE BKA, RLE wrapped.  Net negative 3.8L yesterday on IV lasix 120 mg TID and metolazone 5 mg BID.  BP 163/92 today.  Weight 267 lbs, down from 300 lbs on admission.  Stable renal function (2.5>2.7>2.6).  Continue diuresis.    Donato Heinz, MD

## 2021-03-02 NOTE — Progress Notes (Signed)
Nutrition Education Note  RD consulted for low sodium nutrition education.  RD provided "Low Sodium Nutrition Therapy" handout from the [employer]. Reviewed patient's dietary recall. Provided examples on ways to decrease sodium intake in diet. Discouraged intake of processed foods and use of salt shaker. Encouraged fresh fruits and vegetables as well as whole grain sources of carbohydrates to maximize fiber intake.   Reviewed handout with patient verbally as he is legally blind.  RD discussed why it is important for patient to adhere to diet recommendations, and emphasized the role of fluids, foods to avoid, and importance of weighing self daily. Teach back method used.  Expect fair compliance.  Body mass index is 39.36 kg/m. Pt meets criteria for obesity based on current BMI.  Current diet order is 2GmNA, patient is consuming approximately 100% of meals at this time. Labs and medications reviewed. No further nutrition interventions warranted at this time. If additional nutrition issues arise, please re-consult RD.   Clayton Bibles, MS, RD, LDN Inpatient Clinical Dietitian Contact information available via Amion

## 2021-03-02 NOTE — Plan of Care (Addendum)

## 2021-03-02 NOTE — Progress Notes (Signed)
Continued to monitor BP throughout the night, especially after giving scheduled BP meds. Patient's blood pressure would be elevated; and then after BP meds, patient's BP would come down some. The best BP the patient has had during the night was 159/88. Rechecked patient's BP during shift change after BP meds, and patient's BP was 163/92. Passed it along to dayshift nurse.

## 2021-03-02 NOTE — Progress Notes (Signed)
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  He presented 02/27/2021 with leg edema and increased anasarca over the past month.  Last 2D echo showed ejection fraction 40%.  He is having a great diuresis with IV Lasix and metolazone.  Blood pressure 163/92 pulse 78 temperature 99 O2 sats 96% room air.  Urine output 5.6 L 03/01/2021  Sodium 142 potassium 4.1 chloride 112 CO2 22 BUN 58 creatinine 2.5 glucose 158 calcium 8.6 hemoglobin 8.5  Objective:  Vital signs in last 24 hours:  Temp:  [98 F (36.7 C)-98.4 F (36.9 C)] 98 F (36.7 C) (08/29 0509) Pulse Rate:  [78-86] 86 (08/29 0509) Resp:  [16-20] 19 (08/29 0509) BP: (159-180)/(88-110) 163/92 (08/29 0738) SpO2:  [96 %-100 %] 96 % (08/29 0509) Weight:  [120.9 kg] 120.9 kg (08/29 0135)  Weight change: -5.4 kg Filed Weights   02/28/21 0500 03/01/21 0421 03/02/21 0135  Weight: 127.3 kg 126.3 kg 120.9 kg    Intake/Output: I/O last 3 completed shifts: In: 1207.7 [P.O.:720; IV Piggyback:487.7] Out: 7100 [Urine:7100]   Intake/Output this shift:  Total I/O In: -  Out: 900 [Urine:900]   alert, nad   no jvd  Chest dec'd bilat at bases, no rales/ wheezing  RRR no MRG  Abd soft ntnd no mass or ascites +bs Ext 2- 3+ bilat pitting leg/hip / scrotal/ abd and thoracic wall edema Neuro is alert, Ox 3 , nf, no asterixis      Basic Metabolic Panel: Recent Labs  Lab 02/27/21 0622 02/28/21 0536 03/01/21 0517 03/02/21 0428  NA 141 146* 143 142  K 3.8 4.0 4.0 4.1  CL 113* 117* 113* 112*  CO2 22 21* 23 22  GLUCOSE 225* 146* 156* 158*  BUN 51* 54* 53* 58*  CREATININE 2.64*  2.50* 2.48* 2.67* 2.56*  CALCIUM 8.0* 8.4* 8.2* 8.6*  MG 1.8 1.9  --   --     Liver Function Tests: Recent Labs  Lab 02/27/21 0622  AST 26  ALT 32  ALKPHOS 344*  BILITOT 1.2  PROT 6.5  ALBUMIN 2.0*   No results  for input(s): LIPASE, AMYLASE in the last 168 hours. No results for input(s): AMMONIA in the last 168 hours.  CBC: Recent Labs  Lab 02/27/21 0622 02/28/21 0536 03/01/21 0517  WBC 7.2 7.0 6.1  NEUTROABS 5.6  --   --   HGB 9.1* 8.7* 8.5*  HCT 30.1* 27.9* 27.6*  MCV 95.0 90.6 91.1  PLT 218 229 209    Cardiac Enzymes: No results for input(s): CKTOTAL, CKMB, CKMBINDEX, TROPONINI in the last 168 hours.  BNP: Invalid input(s): POCBNP  CBG: Recent Labs  Lab 03/01/21 1131 03/01/21 1724 03/01/21 2114 03/01/21 2248 03/02/21 0742  GLUCAP 179* 180* 142* 126* 147*    Microbiology: Results for orders placed or performed during the hospital encounter of 02/27/21  Resp Panel by RT-PCR (Flu A&B, Covid) Nasopharyngeal Swab     Status: None   Collection Time: 02/27/21  5:26 AM   Specimen: Nasopharyngeal Swab; Nasopharyngeal(NP) swabs in vial transport medium  Result Value Ref Range Status   SARS Coronavirus 2 by RT PCR NEGATIVE NEGATIVE Final    Comment: (NOTE) SARS-CoV-2 target nucleic acids are NOT DETECTED.  The SARS-CoV-2 RNA is generally detectable in upper respiratory specimens during the acute phase of infection. The lowest concentration of SARS-CoV-2 viral copies this assay can detect  is 138 copies/mL. A negative result does not preclude SARS-Cov-2 infection and should not be used as the sole basis for treatment or other patient management decisions. A negative result may occur with  improper specimen collection/handling, submission of specimen other than nasopharyngeal swab, presence of viral mutation(s) within the areas targeted by this assay, and inadequate number of viral copies(<138 copies/mL). A negative result must be combined with clinical observations, patient history, and epidemiological information. The expected result is Negative.  Fact Sheet for Patients:  EntrepreneurPulse.com.au  Fact Sheet for Healthcare Providers:   IncredibleEmployment.be  This test is no t yet approved or cleared by the [place] FDA and  has been authorized for detection and/or diagnosis of SARS-CoV-2 by FDA under an Emergency Use Authorization (EUA). This EUA will remain  in effect (meaning this test can be used) for the duration of the COVID-19 declaration under Section 564(b)(1) of the Act, 21 U.S.C.section 360bbb-3(b)(1), unless the authorization is terminated  or revoked sooner.       Influenza A by PCR NEGATIVE NEGATIVE Final   Influenza B by PCR NEGATIVE NEGATIVE Final    Comment: (NOTE) The Xpert Xpress SARS-CoV-2/FLU/RSV plus assay is intended as an aid in the diagnosis of influenza from Nasopharyngeal swab specimens and should not be used as a sole basis for treatment. Nasal washings and aspirates are unacceptable for Xpert Xpress SARS-CoV-2/FLU/RSV testing.  Fact Sheet for Patients: EntrepreneurPulse.com.au  Fact Sheet for Healthcare Providers: IncredibleEmployment.be  This test is not yet approved or cleared by the [place] FDA and has been authorized for detection and/or diagnosis of SARS-CoV-2 by FDA under an Emergency Use Authorization (EUA). This EUA will remain in effect (meaning this test can be used) for the duration of the COVID-19 declaration under Section 564(b)(1) of the Act, 21 U.S.C. section 360bbb-3(b)(1), unless the authorization is terminated or revoked.  Performed at [facility], [place] [address], [place], [place] [address]     Coagulation Studies: No results for input(s): LABPROT, INR in the last 72 hours.  Urinalysis: Recent Labs    02/27/21 0813  COLORURINE AMBER*  LABSPEC 1.023  PHURINE 5.0  GLUCOSEU [place] NEGATIVE  PROTEINUR >=300*  NITRITE NEGATIVE  LEUKOCYTESUR NEGATIVE      Imaging: VAS US RENAL ARTERY DUPLEX  Result Date:  03/01/2021 ABDOMINAL VISCERAL Patient Name:  ANDERSSON SCHLOTTER  Date of Exam:   02/28/2021 Medical Rec #: [id number]             Accession #:    [id number] Date of Birth: 1971/06/13             Patient Gender: M Patient Age:   50 years Exam Location:  [facility] Procedure:      VAS US RENAL ARTERY DUPLEX Referring Phys: [id number] DAYNA N DUNN -------------------------------------------------------------------------------- Indications: Hypertension, CKD 4 Limitations: Air/bowel gas, obesity and limited ability to reposition. Comparison Study: 02-27-2021 US Renal showed increased echogenicity of bilateral                   kidneys. Performing Technologist: Darlin Coco RDMS, RVT  Examination Guidelines: A complete evaluation includes B-mode imaging, spectral Doppler, color Doppler, and power Doppler as needed of all accessible portions of each vessel. Bilateral testing is considered an integral part of a complete examination. Limited examinations for reoccurring indications may be performed as noted.  Duplex Findings: +----------------------+--------+--------+------+--------+ Mesenteric  PSV cm/sEDV cm/sPlaqueComments +----------------------+--------+--------+------+--------+ Aorta Mid                81                          +----------------------+--------+--------+------+--------+ Celiac Artery Proximal   86                          +----------------------+--------+--------+------+--------+ SMA Proximal            233                          +----------------------+--------+--------+------+--------+    +------------------+--------+--------+-------+ Right Renal ArteryPSV cm/sEDV cm/sComment +------------------+--------+--------+-------+ Origin               22      4            +------------------+--------+--------+-------+ Proximal             44      10           +------------------+--------+--------+-------+ Mid                  41      13            +------------------+--------+--------+-------+ Distal               40      10           +------------------+--------+--------+-------+ +-----------------+--------+--------+------------------------------------------+ Left Renal ArteryPSV cm/sEDV cm/s                 Comment                   +-----------------+--------+--------+------------------------------------------+ Origin                           Unable to insonate secondary to overlying                                       bowel gas and patient positioning      +-----------------+--------+--------+------------------------------------------+ Proximal                         Unable to insonate secondary to overlying                                       bowel gas and patient positioning      +-----------------+--------+--------+------------------------------------------+ Mid                 57      8                                               +-----------------+--------+--------+------------------------------------------+ Distal                           Unable to insonate secondary to overlying  bowel gas and patient positioning      +-----------------+--------+--------+------------------------------------------+  Technologist observations: Limited views of left kidney secondary to overlying bowel gas, free fluid in the abdomen, bowel loops, as well as limited ability of patient to reposition. +------------+--------+--------+----+-----------+--------+--------+----+ Right KidneyPSV cm/sEDV cm/sRI  Left KidneyPSV cm/sEDV cm/sRI   +------------+--------+--------+----+-----------+--------+--------+----+ Upper Pole  10      5       0.46Upper Pole 13      4       0.67 +------------+--------+--------+----+-----------+--------+--------+----+ Mid         10      4       0.56Mid        14      7       0.50  +------------+--------+--------+----+-----------+--------+--------+----+ Lower Pole  9       4       0.58Lower Pole 17      6       0.67 +------------+--------+--------+----+-----------+--------+--------+----+ Hilar       22      4       0.80Hilar      22      6       0.71 +------------+--------+--------+----+-----------+--------+--------+----+ +------------------+-----+------------------+----+ Right Kidney           Left Kidney            +------------------+-----+------------------+----+ RAR                    RAR                    +------------------+-----+------------------+----+ RAR (manual)      0.54 RAR (manual)      0.70 +------------------+-----+------------------+----+ Cortex            0.60 Cortex            0.64 +------------------+-----+------------------+----+ Cortex thickness       Corex thickness        +------------------+-----+------------------+----+ Kidney length (cm)11.97Kidney length (cm)     +------------------+-----+------------------+----+  Summary: Renal:  Right: Normal size right kidney. RRV flow present. Normal right        Resisitive Index. No evidence of right renal artery stenosis. Left:  Normal left Resistive Index. No evidence of stenosis in        visualized segment; however, views were extremely limited        during today's examination. Mesenteric: Normal Celiac artery and Superior Mesenteric artery findings.  *See table(s) above for measurements and observations.  Diagnosing physician: Monica Martinez MD  Electronically signed by Monica Martinez MD on 03/01/2021 at 2:23:22 PM.    Final      Medications:    sodium chloride     furosemide 120 mg (03/02/21 0602)    atorvastatin  20 mg Oral Daily   carvedilol  25 mg Oral BID WC   ferrous sulfate  325 mg Oral TID WC   gabapentin  300 mg Oral BID   heparin  5,000 Units Subcutaneous Q8H   hydrALAZINE  75 mg Oral Q6H   insulin aspart  0-15 Units Subcutaneous TID WC    insulin aspart  0-5 Units Subcutaneous QHS   isosorbide mononitrate  30 mg Oral Daily   metolazone  5 mg Oral BID   potassium chloride  10 mEq Oral BID   sodium chloride flush  3 mL Intravenous Q12H   sodium chloride, acetaminophen, hydrALAZINE, ondansetron (ZOFRAN) IV, oxyCODONE, polyethylene glycol, polyvinyl alcohol, sodium chloride flush  Assessment/ Plan:  CKD IV - progressive over the last 1-2 years. Proteinuria is worse here now than earlier this year. 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 Alb 2.0. He has massive edema up to the scapulae bilat, also pleural effusions and possibly pulm edema, not symptomatic at rest. On nasal O2. Continue IV lasix 120 mg q8 hrs, and po zaroxolyn 5 bid.  Diuresing well . Creat stable.  Will follow.  DM on insulin Vol overload - massive volume overload HTN - let's avoid ARB/ acei while diuresing, can use other agents.  Anemia we will check iron studies to evaluate for ESA Bone mineral check renal panel daily and PTH    LOS: 3 Sherril Croon '@TODAY''@8'$ :09 AM

## 2021-03-03 DIAGNOSIS — N184 Chronic kidney disease, stage 4 (severe): Secondary | ICD-10-CM | POA: Diagnosis not present

## 2021-03-03 DIAGNOSIS — D649 Anemia, unspecified: Secondary | ICD-10-CM | POA: Diagnosis not present

## 2021-03-03 DIAGNOSIS — E1129 Type 2 diabetes mellitus with other diabetic kidney complication: Secondary | ICD-10-CM | POA: Diagnosis not present

## 2021-03-03 DIAGNOSIS — I5041 Acute combined systolic (congestive) and diastolic (congestive) heart failure: Secondary | ICD-10-CM | POA: Diagnosis not present

## 2021-03-03 DIAGNOSIS — R601 Generalized edema: Secondary | ICD-10-CM | POA: Diagnosis not present

## 2021-03-03 DIAGNOSIS — I129 Hypertensive chronic kidney disease with stage 1 through stage 4 chronic kidney disease, or unspecified chronic kidney disease: Secondary | ICD-10-CM | POA: Diagnosis not present

## 2021-03-03 DIAGNOSIS — I10 Essential (primary) hypertension: Secondary | ICD-10-CM | POA: Diagnosis not present

## 2021-03-03 DIAGNOSIS — Z794 Long term (current) use of insulin: Secondary | ICD-10-CM | POA: Diagnosis not present

## 2021-03-03 DIAGNOSIS — E8779 Other fluid overload: Secondary | ICD-10-CM | POA: Diagnosis not present

## 2021-03-03 DIAGNOSIS — N179 Acute kidney failure, unspecified: Secondary | ICD-10-CM | POA: Diagnosis not present

## 2021-03-03 LAB — RENAL FUNCTION PANEL
Albumin: 2.1 g/dL — ABNORMAL LOW (ref 3.5–5.0)
Anion gap: 7 (ref 5–15)
BUN: 56 mg/dL — ABNORMAL HIGH (ref 6–20)
CO2: 22 mmol/L (ref 22–32)
Calcium: 8.2 mg/dL — ABNORMAL LOW (ref 8.9–10.3)
Chloride: 111 mmol/L (ref 98–111)
Creatinine, Ser: 2.69 mg/dL — ABNORMAL HIGH (ref 0.61–1.24)
GFR, Estimated: 28 mL/min — ABNORMAL LOW (ref >60)
Glucose, Bld: 230 mg/dL — ABNORMAL HIGH (ref 70–99)
Phosphorus: 4.9 mg/dL — ABNORMAL HIGH (ref 2.5–4.6)
Potassium: 3.8 mmol/L (ref 3.5–5.1)
Sodium: 140 mmol/L (ref 135–145)

## 2021-03-03 LAB — BASIC METABOLIC PANEL
Anion gap: 6 (ref 5–15)
BUN: 54 mg/dL — ABNORMAL HIGH (ref 6–20)
CO2: 23 mmol/L (ref 22–32)
Calcium: 8.2 mg/dL — ABNORMAL LOW (ref 8.9–10.3)
Chloride: 111 mmol/L (ref 98–111)
Creatinine, Ser: 2.44 mg/dL — ABNORMAL HIGH (ref 0.61–1.24)
GFR, Estimated: 32 mL/min — ABNORMAL LOW (ref >60)
Glucose, Bld: 230 mg/dL — ABNORMAL HIGH (ref 70–99)
Potassium: 3.8 mmol/L (ref 3.5–5.1)
Sodium: 140 mmol/L (ref 135–145)

## 2021-03-03 LAB — CBC
HCT: 31.3 % — ABNORMAL LOW (ref 39.0–52.0)
Hemoglobin: 9.4 g/dL — ABNORMAL LOW (ref 13.0–17.0)
MCH: 28.2 pg (ref 26.0–34.0)
MCHC: 30 g/dL (ref 30.0–36.0)
MCV: 94 fL (ref 80.0–100.0)
Platelets: 221 10*3/uL (ref 150–400)
RBC: 3.33 MIL/uL — ABNORMAL LOW (ref 4.22–5.81)
RDW: 18.6 % — ABNORMAL HIGH (ref 11.5–15.5)
WBC: 7.2 10*3/uL (ref 4.0–10.5)
nRBC: 0 % (ref 0.0–0.2)

## 2021-03-03 LAB — GLUCOSE, CAPILLARY
Glucose-Capillary: 214 mg/dL — ABNORMAL HIGH (ref 70–99)
Glucose-Capillary: 186 mg/dL — ABNORMAL HIGH (ref 70–99)
Glucose-Capillary: 162 mg/dL — ABNORMAL HIGH (ref 70–99)

## 2021-03-03 LAB — PARATHYROID HORMONE, INTACT (NO CA): PTH: 36 pg/mL (ref 15–65)

## 2021-03-03 MED ORDER — SODIUM CHLORIDE 0.9 % IV SOLN
200.0000 mg | INTRAVENOUS | Status: DC
  Administered 2021-03-03 – 2021-03-06 (×4): 200 mg via INTRAVENOUS
  Filled 2021-03-03 (×4): qty 10

## 2021-03-03 NOTE — Progress Notes (Signed)
PT Cancellation Note  Patient Details Name: Todd Mccoy MRN: [id number] DOB: 05-14-1971   Cancelled Treatment:    Reason Eval/Treat Not Completed:  Attempted PT tx session-pt declined participation with PT despite encouragement. Recommend OOB to chair with nursing using lift if necessary. Will continue to check back.    [facility]  Office: [phone] Pager: [phone]

## 2021-03-03 NOTE — Progress Notes (Addendum)
Progress Note  Patient Name: Todd Mccoy Date of Encounter: 03/03/2021  [facility] Cardiologist: Fransico Him, MD   Subjective   No chest pain, SIB improved since admit  Inpatient Medications    Scheduled Meds:  atorvastatin  20 mg Oral Daily   carvedilol  25 mg Oral BID WC   ferrous sulfate  325 mg Oral TID WC   gabapentin  300 mg Oral BID   heparin  5,000 Units Subcutaneous Q8H   hydrALAZINE  75 mg Oral Q6H   insulin aspart  0-15 Units Subcutaneous TID WC   insulin aspart  0-5 Units Subcutaneous QHS   isosorbide mononitrate  30 mg Oral Daily   metolazone  5 mg Oral BID   potassium chloride  10 mEq Oral BID   sodium chloride flush  3 mL Intravenous Q12H   Continuous Infusions:  sodium chloride 250 mL (03/03/21 1013)   furosemide 120 mg (03/03/21 0618)   iron sucrose 200 mg (03/03/21 0943)   PRN Meds: sodium chloride, acetaminophen, hydrALAZINE, ondansetron (ZOFRAN) IV, oxyCODONE, polyethylene glycol, polyvinyl alcohol, sodium chloride flush   Vital Signs    Vitals:   03/02/21 2050 03/03/21 0613 03/03/21 0632 03/03/21 0915  BP: 140/79 (!) 148/82  (!) 163/93  Pulse: 81 79  78  Resp: '20 18  19  '$ Temp: 98.4 F (36.9 C) 98.6 F (37 C)  98.4 F (36.9 C)  TempSrc: Oral Oral  Oral  SpO2: 95% 97%  96%  Weight:   120.3 kg 120.3 kg  Height:    '5\' 10"'$  (1.778 m)    Intake/Output Summary (Last 24 hours) at 03/03/2021 1153 Last data filed at 03/03/2021 1038 Gross per 24 hour  Intake 186.07 ml  Output 2975 ml  Net -2788.93 ml   Last 3 Weights 03/03/2021 03/03/2021 03/02/2021  Weight (lbs) 265 lb 3.4 oz 265 lb 3.4 oz 266 lb 8.6 oz  Weight (kg) 120.3 kg 120.3 kg 120.9 kg      Telemetry    SR with 7 beats of slow VT  - Personally Reviewed  ECG    No new - Personally Reviewed  Physical Exam   GEN: No acute distress.  Legally Blind Neck: No JVD Cardiac: RRR, no murmurs, rubs, or gallops.  Respiratory: Clear to diminished to auscultation  bilaterally. GI: Soft, nontender, non-distended still with edema in tissue from waist down MS: ++ edema see above; No deformity. Neuro:  Nonfocal  Psych: Normal affect   Labs    High Sensitivity Troponin:  No results for input(s): TROPONINIHS in the last 720 hours.    Chemistry Recent Labs  Lab 02/27/21 0622 02/28/21 0536 03/01/21 0517 03/02/21 0428 03/03/21 0459  NA 141   < > 143 142 140  140  K 3.8   < > 4.0 4.1 3.8  3.8  CL 113*   < > 113* 112* 111  111  CO2 22   < > '23 22 22  23  '$ GLUCOSE 225*   < > 156* 158* 230*  230*  BUN 51*   < > 53* 58* 56*  54*  CREATININE 2.64*  2.50*   < > 2.67* 2.56* 2.69*  2.44*  CALCIUM 8.0*   < > 8.2* 8.6* 8.2*  8.2*  PROT 6.5  --   --   --   --   ALBUMIN 2.0*  --   --   --  2.1*  AST 26  --   --   --   --  ALT 32  --   --   --   --   ALKPHOS 344*  --   --   --   --   BILITOT 1.2  --   --   --   --   GFRNONAA 29*  31*   < > 29* 30* 28*  32*  ANIONGAP 6   < > '7 8 7  6   '$ < > = values in this interval not displayed.     Hematology Recent Labs  Lab 02/28/21 0536 03/01/21 0517 03/03/21 0459  WBC 7.0 6.1 7.2  RBC 3.08* 3.03* 3.33*  HGB 8.7* 8.5* 9.4*  HCT 27.9* 27.6* 31.3*  MCV 90.6 91.1 94.0  MCH 28.2 28.1 28.2  MCHC 31.2 30.8 30.0  RDW 18.7* 18.7* 18.6*  PLT 229 209 221    BNP Recent Labs  Lab 02/27/21 0622  BNP >4,500.0*     DDimer No results for input(s): DDIMER in the last 168 hours.   Radiology    No results found.  Cardiac Studies   2D echo 02/27/2021 IMPRESSIONS    1. Left ventricular ejection fraction, by estimation, is 40%. The left  ventricle has moderately decreased function. The left ventricle  demonstrates global hypokinesis. There is moderate-severe left ventricular  hypertrophy. Left ventricular diastolic  parameters are consistent with Grade III diastolic dysfunction  (restrictive).   2. Right ventricular systolic function is moderately reduced. The right  ventricular size is mildly  enlarged. Moderately increased right  ventricular wall thickness. There is moderately elevated pulmonary artery  systolic pressure. The estimated right  ventricular systolic pressure is [id number] mmHg.   3. Left atrial size was moderately dilated.   4. A small pericardial effusion is present. The pericardial effusion is  circumferential. There is no evidence of cardiac tamponade. Large pleural  effusion.   5. The mitral valve is grossly normal. Trivial mitral valve  regurgitation. No evidence of mitral stenosis.   6. Tricuspid valve regurgitation is moderate.   7. The aortic valve is tricuspid. Aortic valve regurgitation is not  visualized. No aortic stenosis is present.   8. The inferior vena cava is normal in size with <50% respiratory  variability, suggesting right atrial pressure of 8 mmHg.   Patient Profile     50 y.o. 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  Not a cath or coronary CTA candidate at this time due to AKI on CKD - currently on IV Lasix 120 every 8 hours and albumin for nephrotic syndrome - he put out 2.8 L yesterday and is net neg 9899 ml since admit - weight down 34.7 lbs from  admit - SCr remains elevated 2.64>2.48>2.67>2.56>>2.69  - strict I/o's, daily weights - diuretics per nephrology (  Lasix 120 mg every 8 hours metolazone 5 mg BID)  - needs aggressive control of BP still elevated - continue Carvedilol '25mg'$  BID - no ACE/ARB/ARNi/MRN in setting of AKI on CKD - increased Hydralazine to '75mg'$  qid    Chest pain -had atypical right sided CP early this am with normal EKG -currently pain free -will need ischemic workup at some point  NSVT 7 beats last pm  k+ 3.8    CKD stage IV  - d/w IM, and  nephrology consulting for probable nephrotic syndrome - follow    Uncontrolled HTN - BP still elevated but improved at times 159/88 mmHg but as high as 163/93 - continue carvedilol '25mg'$  BID and Imdur '30mg'$  daily - losartan on hold while actively diuresing in setting of AKI on CKD - increase hydralazine to '75mg'$  QID  - TSH wnl - prelim renal artery duplex with no RAS - consider OP sleep study   Tobacco abuse - cessation will be important for recovery   Morbid obesity - consider OP sleep study -BMI 39.36   DM - per primary team   Chronic anemia - per primary team - Hg 9.1, slightly higher than previous values      For questions or updates, please contact [facility] Please consult [email or web address] for contact info under        Signed, Cecilie Kicks, NP  03/03/2021, 11:53 AM    Patient seen and examined.  Agree with above documentation.  On exam, patient is alert and oriented, regular rate and rhythm, no murmurs, diminished breath sounds, +JVD, s/p LLE BKA, RLE wrapped.  Net -2.8 L yesterday on IV Lasix 120 mg 3 times daily and metolazone 5 mg twice daily.  Weight 265 pounds, down 35 pounds from admission.  Creatinine stable at 2.7.  Would continue diuresis.  Donato Heinz, MD

## 2021-03-03 NOTE — Progress Notes (Signed)
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  He presented 02/27/2021 with leg edema and increased anasarca over the past month.  Last 2D echo showed ejection fraction 40%.  He is having a great diuresis with IV Lasix and metolazone.  Blood pressure 148/82 pulse 77 temperature 98.2 O2 sats 97%.  Urine output 3.4 L 03/02/2021 weight down to 120 kg  Sodium 140 potassium 3.8 chloride 111 CO2 22 BUN 36 creatinine 2.69 glucose 230 phosphorus 4.9 hemoglobin 9.4  Objective:  Vital signs in last 24 hours:  Temp:  [98.4 F (36.9 C)-98.9 F (37.2 C)] 98.6 F (37 C) (08/30 [id number]) Pulse Rate:  [79-81] 79 (08/30 0613) Resp:  [16-20] 18 (08/30 [id number]) BP: (140-165)/(79-96) 148/82 (08/30 0613) SpO2:  [95 %-99 %] 97 % (08/30 0613) Weight:  [120.3 kg] 120.3 kg (08/30 [id number])  Weight change: -0.6 kg Filed Weights   03/01/21 0421 03/02/21 0135 03/03/21 [id number]  Weight: 126.3 kg 120.9 kg 120.3 kg    Intake/Output: I/O last 3 completed shifts: In: 848.1 [P.O.:600; IV Piggyback:248.1] Out: 5200 [Urine:5200]   Intake/Output this shift:  No intake/output data recorded.   alert, nad   no jvd  Chest dec'd bilat at bases, no rales/ wheezing  RRR no MRG  Abd soft ntnd no mass or ascites +bs Ext 2- 3+ bilat pitting leg/hip / scrotal/ abd and thoracic wall edema Neuro is alert, Ox 3 , nf, no asterixis      Basic Metabolic Panel: Recent Labs  Lab 02/27/21 0622 02/28/21 0536 03/01/21 0517 03/02/21 0428 03/03/21 0459  NA 141 146* 143 142 140  140  K 3.8 4.0 4.0 4.1 3.8  3.8  CL 113* 117* 113* 112* 111  111  CO2 22 21* '23 22 22  23  '$ GLUCOSE 225* 146* 156* 158* 230*  230*  BUN 51* 54* 53* 58* 56*  54*  CREATININE 2.64*  2.50* 2.48* 2.67* 2.56* 2.69*  2.44*  CALCIUM 8.0* 8.4* 8.2* 8.6* 8.2*  8.2*  MG 1.8 1.9  --   --   --   PHOS  --   --   --   --  4.9*      Liver Function Tests: Recent Labs  Lab 02/27/21 0622 03/03/21 0459  AST 26  --   ALT 32  --   ALKPHOS 344*  --   BILITOT 1.2  --   PROT 6.5  --   ALBUMIN 2.0* 2.1*    No results for input(s): LIPASE, AMYLASE in the last 168 hours. No results for input(s): AMMONIA in the last 168 hours.  CBC: Recent Labs  Lab 02/27/21 0622 02/28/21 0536 03/01/21 0517 03/03/21 0459  WBC 7.2 7.0 6.1 7.2  NEUTROABS 5.6  --   --   --   HGB 9.1* 8.7* 8.5* 9.4*  HCT 30.1* 27.9* 27.6* 31.3*  MCV 95.0 90.6 91.1 94.0  PLT 218 229 209 221     Cardiac Enzymes: No results for input(s): CKTOTAL, CKMB, CKMBINDEX, TROPONINI in the last 168 hours.  BNP: Invalid input(s): POCBNP  CBG: Recent Labs  Lab 03/02/21 0742 03/02/21 1057 03/02/21 1639 03/02/21 2051 03/03/21 0728  GLUCAP 147* 184* 286* 226* 214*     Microbiology: Results for orders placed or performed during the hospital encounter of 02/27/21  Resp Panel by RT-PCR (Flu A&B, Covid) Nasopharyngeal Swab     Status: None  Collection Time: 02/27/21  5:26 AM   Specimen: Nasopharyngeal Swab; Nasopharyngeal(NP) swabs in vial transport medium  Result Value Ref Range Status   SARS Coronavirus 2 by RT PCR NEGATIVE NEGATIVE Final    Comment: (NOTE) SARS-CoV-2 target nucleic acids are NOT DETECTED.  The SARS-CoV-2 RNA is generally detectable in upper respiratory specimens during the acute phase of infection. The lowest concentration of SARS-CoV-2 viral copies this assay can detect is 138 copies/mL. A negative result does not preclude SARS-Cov-2 infection and should not be used as the sole basis for treatment or other patient management decisions. A negative result may occur with  improper specimen collection/handling, submission of specimen other than nasopharyngeal swab, presence of viral mutation(s) within the areas targeted by this assay, and inadequate number of viral copies(<138 copies/mL). A negative result must be combined  with clinical observations, patient history, and epidemiological information. The expected result is Negative.  Fact Sheet for Patients:  EntrepreneurPulse.com.au  Fact Sheet for Healthcare Providers:  IncredibleEmployment.be  This test is no t yet approved or cleared by the [place] FDA and  has been authorized for detection and/or diagnosis of SARS-CoV-2 by FDA under an Emergency Use Authorization (EUA). This EUA will remain  in effect (meaning this test can be used) for the duration of the COVID-19 declaration under Section 564(b)(1) of the Act, 21 U.S.C.section 360bbb-3(b)(1), unless the authorization is terminated  or revoked sooner.       Influenza A by PCR NEGATIVE NEGATIVE Final   Influenza B by PCR NEGATIVE NEGATIVE Final    Comment: (NOTE) The Xpert Xpress SARS-CoV-2/FLU/RSV plus assay is intended as an aid in the diagnosis of influenza from Nasopharyngeal swab specimens and should not be used as a sole basis for treatment. Nasal washings and aspirates are unacceptable for Xpert Xpress SARS-CoV-2/FLU/RSV testing.  Fact Sheet for Patients: EntrepreneurPulse.com.au  Fact Sheet for Healthcare Providers: IncredibleEmployment.be  This test is not yet approved or cleared by the [place] FDA and has been authorized for detection and/or diagnosis of SARS-CoV-2 by FDA under an Emergency Use Authorization (EUA). This EUA will remain in effect (meaning this test can be used) for the duration of the COVID-19 declaration under Section 564(b)(1) of the Act, 21 U.S.C. section 360bbb-3(b)(1), unless the authorization is terminated or revoked.  Performed at [facility], [place] [address], [place], [place] [address]     Coagulation Studies: No results for input(s): LABPROT, INR in the last 72 hours.  Urinalysis: No results for input(s): COLORURINE, LABSPEC, PHURINE, GLUCOSEU,  HGBUR, BILIRUBINUR, KETONESUR, PROTEINUR, UROBILINOGEN, NITRITE, LEUKOCYTESUR in the last 72 hours.  Invalid input(s): APPERANCEUR     Imaging: No results found.   Medications:    sodium chloride     furosemide 120 mg (03/03/21 0618)    atorvastatin  20 mg Oral Daily   carvedilol  25 mg Oral BID WC   ferrous sulfate  325 mg Oral TID WC   gabapentin  300 mg Oral BID   heparin  5,000 Units Subcutaneous Q8H   hydrALAZINE  75 mg Oral Q6H   insulin aspart  0-15 Units Subcutaneous TID WC   insulin aspart  0-5 Units Subcutaneous QHS   isosorbide mononitrate  30 mg Oral Daily   metolazone  5 mg Oral BID   potassium chloride  10 mEq Oral BID   sodium chloride flush  3 mL Intravenous Q12H   sodium chloride, acetaminophen, hydrALAZINE, ondansetron (ZOFRAN) IV, oxyCODONE, polyethylene glycol, polyvinyl alcohol, sodium chloride flush  Assessment/ Plan:  CKD IV - progressive over the last 1-2 years. Proteinuria is worse here now than earlier this year. 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 Alb 2.0. He has massive edema up to the scapulae bilat, also pleural effusions and possibly pulm edema, not symptomatic at rest. On nasal O2. Continue IV lasix 120 mg q8 hrs, and po zaroxolyn 5 bid.  Diuresing well . Creat stable.  Will follow.  DM on insulin Vol overload - massive volume overload HTN - let's avoid ARB/ acei while diuresing, can use other agents.  Anemia.  Iron sats 13% we will use some IV iron Bone mineral check renal panel daily and PTH    LOS: 4 Sherril Croon '@TODAY''@7'$ :57 AM

## 2021-03-03 NOTE — Progress Notes (Signed)
Inpatient Diabetes Program Recommendations  AACE/ADA: New Consensus Statement on Inpatient Glycemic Control (2015)  Target Ranges:  Prepandial:   less than 140 mg/dL      Peak postprandial:   less than 180 mg/dL (1-2 hours)      Critically ill patients:  140 - 180 mg/dL   Lab Results  Component Value Date   GLUCAP 214 (H) 03/03/2021   HGBA1C 7.2 (H) 02/27/2021    Review of Glycemic Control Results for Todd Mccoy, Todd Mccoy (MRN [id number]) as of 03/03/2021 11:12  Ref. Range 03/02/2021 10:57 03/02/2021 16:39 03/02/2021 20:51 03/03/2021 07:28  Glucose-Capillary Latest Ref Range: 70 - 99 mg/dL 184 (H) 286 (H) 226 (H) 214 (H)   Diabetes history: Type 2 Dm Outpatient Diabetes medications: Lantus 20 units QHS Current orders for Inpatient glycemic control: Novolog 0-15 units TID & HS  Inpatient Diabetes Program Recommendations:    Consider adding Semglee 14 units QD.   Thanks, Bronson Curb, MSN, RNC-OB Diabetes Coordinator [phone] (8a-5p)

## 2021-03-03 NOTE — Progress Notes (Signed)
OT Cancellation Note  Patient Details Name: Todd Mccoy MRN: [id number] DOB: [birth year]   Cancelled Treatment:    Reason Eval/Treat Not Completed: Other (comment) patient was in deep sleep at this time. Will check back if schedule allows.   Burgin OTR/L, [place] [facility] Office# [phone] Pager# [phone]  03/03/2021, 2:55 PM

## 2021-03-03 NOTE — Progress Notes (Signed)
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 In ED: "Temperature 97.5, pulse 82, RR: 18, blood pressure 225/176, maintaining oxygen saturation on room air.  BNP more than 4500, 5 0, BUN 51, GFR: 31, H&H: 9.1/30.1, COVID-19 negative.  Chest x-ray shows progressive pulmonary edema and bilateral pleural effusion greater on right side.  Patient was given Lasix 80 IV once in ED.ther evaluation." Cardiology, nephrology consulted. He was placed on high-dose IV Lasix with IV albumin x 6 doses and metalozone and diuresing well.  Subjective: Resting comfortably swelling improving, able to lay flat. On room air. Blind at baseline. Assessment & Plan:  Shortness of breath, leg swelling/Anasarca, progressively worsening x 1 month Volume overload Acute combined systolic/diastolic CHF superimposed on baseline renal insufficiency and hypoalbuminemia  Echo showed EF of 40%, grade 3 DD, moderate to severe left ventricular hypertrophy. 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  Cardiology and nephrology following closely, appreciate input .  Diuresing very well net negative balance close to 10 liters Cont on Lasix 120 mg q6 w/ metolazone 5 mg bid/ s/p IV albumin x6 doses.Good UOP  2975 ml. Continue to monitor I's/O, Daily weight, renal function.  Admission weight 300 pounds  now down to 265  Net IO Since Admission: -9,898.97 mL [03/03/21 1253]  Filed Weights   03/02/21 0135 03/03/21 0632 03/03/21 0915  Weight: 120.9 kg 120.3 kg 120.3 kg    Intake/Output Summary (Last 24 hours) at 03/03/2021 1253 Last data filed at 03/03/2021 1038 Gross per 24 hour  Intake 186.07 ml  Output 2975 ml  Net -2788.93 ml     Accelerated hypertension, bp 225/176 in ED. BP borderline allow room for diuresis given high-dose Lasix IV Lasix.  Continue on increased dose of hydralazine to 75 mg  q6 8/28. Home dose Coreg,  and Imdur.  Status post clonidine x1  8/27 am.  Monitor and optimize.  DM2 with neuropathy/retinopathy, HbA1c 8.6, blood sugar well controlled on sliding scale.  Recent Labs  Lab 03/02/21 1057 03/02/21 1639 03/02/21 2051 03/03/21 0728 03/03/21 1121  GLUCAP 184* 286* 226* 214* 186*     CKD IV: Creatinine stable 2.4,> 2.6.  Monitor closely while on Lasix , he is tolerating very well.  Recent Labs    12/21/20 0034 12/22/20 0129 01/21/21 1438 02/11/21 1740 02/17/21 0600 02/27/21 0622 02/28/21 0536 03/01/21 0517 03/02/21 0428 03/03/21 0459  BUN 120* 117* 42* 44* 44* 51* 54* 53* 58* 56*  54*  CREATININE 3.32* 3.26* 2.56* 2.44* 2.53* 2.64*  2.50* 2.48* 2.67* 2.56* 2.69*  2.44*     Normocytic anemia-anemia remained stable, monitor.  Nephrology evaluating for ESA= iron at 28 Recent Labs  Lab 02/27/21 0622 02/28/21 0536 03/01/21 0517 03/03/21 0459  HGB 9.1* 8.7* 8.5* 9.4*  HCT 30.1* 27.9* 27.6* 31.3*    Somnolence at at times: It has resolved.  ABG no acute finding.    Tobacco abuse-counseling  done by me.  S/P BKA left 12/2020-continue supportive care, he is wheelchair-WC bound  Dyslipidemia-cont sstatins  Class III morbid obesity BMI 41: Outpatient follow-up with PCP loss, healthy lifestyle.  Sleep apnea evaluation   Diet Order             Diet 2 gram sodium Room service appropriate? Yes; Fluid consistency: Thin  Diet effective now                    Patient's Body mass index is 38.05 kg/m.  DVT prophylaxis: heparin injection 5,000 Units Start: 02/27/21 0800 SCDs Start: 02/27/21 [id number] Code Status:   Code Status: Full Code   Family Communication: plan of care discussed with patient at bedside.  Status is: Inpatient Remains inpatient appropriate because:IV treatments appropriate due to intensity of illness or inability to take PO and Inpatient level of care appropriate due to severity of illness Dispo: The patient is from: Home              Anticipated d/c is to: Home in > 4-5 days              Patient currently is not medically stable to d/c.   Difficult to place patient No Unresulted Labs (From admission, onward)     Start     Ordered   03/03/21 0500  CBC  Every 48 hours,   R      03/01/21 1751   03/03/21 0500  Renal function panel  Daily,   R      03/02/21 0814   02/28/21 [id number]  Basic metabolic panel  Daily,   R      02/27/21 0753   02/27/21 1657  Creatinine, urine, random  Once,   R        02/27/21 1656   02/27/21 1657  Sodium, urine, random  Once,   R        02/27/21 1656           Medications reviewed:  Scheduled Meds:  atorvastatin  20 mg Oral Daily   carvedilol  25 mg Oral BID WC   ferrous sulfate  325 mg Oral TID WC   gabapentin  300 mg Oral BID   heparin  5,000 Units Subcutaneous Q8H   hydrALAZINE  75 mg Oral Q6H   insulin aspart  0-15 Units Subcutaneous TID WC   insulin aspart  0-5 Units Subcutaneous QHS   isosorbide mononitrate  30 mg Oral Daily   metolazone  5 mg Oral BID   potassium chloride  10 mEq Oral BID   sodium chloride flush  3 mL Intravenous Q12H   Continuous Infusions:  sodium chloride 250 mL (03/03/21 1013)   furosemide 120 mg (03/03/21 0618)   iron sucrose 200 mg (03/03/21 0943)   Consultants:see note  Procedures:see note Antimicrobials: Anti-infectives (From admission, onward)    None      Culture/Microbiology    Component Value Date/Time   SDES URINE, RANDOM  12/21/2020 0727   SPECREQUEST  12/21/2020 0727    NONE Performed at [facility], [address], [place], [place] [address]    CULT (A) 12/21/2020 0727    30,000 COLONIES/mL MULTIPLE SPECIES PRESENT, SUGGEST RECOLLECTION   REPTSTATUS 12/22/2020 FINAL 12/21/2020 0727    Other culture-see note  Objective: Vitals: Today's Vitals   03/02/21 2220 03/03/21 0613 03/03/21 0632 03/03/21 0915  BP:  (!) 148/82  (!) 163/93  Pulse:  79  78  Resp:  18  19  Temp:  98.6 F (37 C)  98.4 F (36.9 C)  TempSrc:  Oral  Oral  SpO2:  97%  96%  Weight:   120.3 kg 120.3 kg  Height:    '5\' 10"'$  (1.778 m)  PainSc: 3        Intake/Output Summary (Last 24 hours) at 03/03/2021 1253 Last data filed at 03/03/2021 1038 Gross per 24 hour  Intake 186.07 ml  Output 2975 ml  Net -2788.93 ml    Filed Weights   03/02/21 0135 03/03/21 0632 03/03/21 0915  Weight: 120.9 kg 120.3 kg 120.3 kg   Weight change: -0.6 kg  Intake/Output from previous day: 08/29 0701 - 08/30 0700 In: 306.1 [P.O.:120; IV Piggyback:186.1] Out: 3150 [Urine:3150] Intake/Output this shift: Total I/O In: -  Out: 1525 [Urine:1525] Filed Weights   03/02/21 0135 03/03/21 [id number] 03/03/21 0915  Weight: 120.9 kg 120.3 kg 120.3 kg   Examination: General exam: AAOx 3, 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 HEENT:Oral mucosa moist, Ear/Nose WNL grossly, dentition normal. Respiratory system: bilaterally diminished,  no use of accessory muscle Cardiovascular system: S1 & S2 +, No JVD. Gastrointestinal system: Abdomen soft, NT,ND, BS+ Nervous System:Alert, awake, moving extremities and grossly nonfocal Extremities: Significant edema up to abdomen thigh and rt, lt BKA Skin: No rashes,no icterus. MSK: Normal muscle bulk,tone, power   Data Reviewed: I have personally reviewed following labs and imaging studies CBC: Recent Labs  Lab 02/27/21 0622 02/28/21 0536 03/01/21 0517 03/03/21 0459  WBC 7.2 7.0 6.1 7.2  NEUTROABS 5.6  --    --   --   HGB 9.1* 8.7* 8.5* 9.4*  HCT 30.1* 27.9* 27.6* 31.3*  MCV 95.0 90.6 91.1 94.0  PLT 218 229 209 [id number]    Basic Metabolic Panel: Recent Labs  Lab 02/27/21 0622 02/28/21 0536 03/01/21 0517 03/02/21 0428 03/03/21 0459  NA 141 146* 143 142 140  140  K 3.8 4.0 4.0 4.1 3.8  3.8  CL 113* 117* 113* 112* 111  111  CO2 22 21* '23 22 22  23  '$ GLUCOSE 225* 146* 156* 158* 230*  230*  BUN 51* 54* 53* 58* 56*  54*  CREATININE 2.64*  2.50* 2.48* 2.67* 2.56* 2.69*  2.44*  CALCIUM 8.0* 8.4* 8.2* 8.6* 8.2*  8.2*  MG 1.8 1.9  --   --   --   PHOS  --   --   --   --  4.9*    GFR: Estimated Creatinine Clearance: 48.1 mL/min (A) (by C-G formula based on SCr of 2.44 mg/dL (H)). Liver Function Tests: Recent Labs  Lab 02/27/21 0622 03/03/21 0459  AST 26  --   ALT 32  --   ALKPHOS 344*  --   BILITOT 1.2  --   PROT 6.5  --   ALBUMIN 2.0* 2.1*    No results for input(s): LIPASE, AMYLASE in the last 168 hours. No results for input(s): AMMONIA in the last 168 hours. Coagulation Profile: No results for input(s): INR, PROTIME in the last 168 hours. Cardiac Enzymes: No results for input(s): CKTOTAL, CKMB, CKMBINDEX, TROPONINI in the last 168 hours. BNP (last 3 results) No results for input(s): PROBNP in the last 8760 hours. HbA1C: No results for input(s): HGBA1C in the last 72 hours.  CBG: Recent Labs  Lab 03/02/21 1057 03/02/21 1639 03/02/21 2051 03/03/21 0728 03/03/21 1121  GLUCAP 184* 286* 226* 214* 186*    Lipid Profile: No results for input(s): CHOL, HDL, LDLCALC, TRIG, CHOLHDL, LDLDIRECT  in the last 72 hours. Thyroid Function Tests: No results for input(s): TSH, T4TOTAL, FREET4, T3FREE, THYROIDAB in the last 72 hours.  Anemia Panel: Recent Labs    03/02/21 0907  TIBC 214*  IRON 28*    Sepsis Labs: No results for input(s): PROCALCITON, LATICACIDVEN in the last 168 hours.  Recent Results (from the past 240 hour(s))  Resp Panel by RT-PCR (Flu A&B, Covid)  Nasopharyngeal Swab     Status: None   Collection Time: 02/27/21  5:26 AM   Specimen: Nasopharyngeal Swab; Nasopharyngeal(NP) swabs in vial transport medium  Result Value Ref Range Status   SARS Coronavirus 2 by RT PCR NEGATIVE NEGATIVE Final    Comment: (NOTE) SARS-CoV-2 target nucleic acids are NOT DETECTED.  The SARS-CoV-2 RNA is generally detectable in upper respiratory specimens during the acute phase of infection. The lowest concentration of SARS-CoV-2 viral copies this assay can detect is 138 copies/mL. A negative result does not preclude SARS-Cov-2 infection and should not be used as the sole basis for treatment or other patient management decisions. A negative result may occur with  improper specimen collection/handling, submission of specimen other than nasopharyngeal swab, presence of viral mutation(s) within the areas targeted by this assay, and inadequate number of viral copies(<138 copies/mL). A negative result must be combined with clinical observations, patient history, and epidemiological information. The expected result is Negative.  Fact Sheet for Patients:  EntrepreneurPulse.com.au  Fact Sheet for Healthcare Providers:  IncredibleEmployment.be  This test is no t yet approved or cleared by the [place] FDA and  has been authorized for detection and/or diagnosis of SARS-CoV-2 by FDA under an Emergency Use Authorization (EUA). This EUA will remain  in effect (meaning this test can be used) for the duration of the COVID-19 declaration under Section 564(b)(1) of the Act, 21 U.S.C.section 360bbb-3(b)(1), unless the authorization is terminated  or revoked sooner.       Influenza A by PCR NEGATIVE NEGATIVE Final   Influenza B by PCR NEGATIVE NEGATIVE Final    Comment: (NOTE) The Xpert Xpress SARS-CoV-2/FLU/RSV plus assay is intended as an aid in the diagnosis of influenza from Nasopharyngeal swab specimens and should not be  used as a sole basis for treatment. Nasal washings and aspirates are unacceptable for Xpert Xpress SARS-CoV-2/FLU/RSV testing.  Fact Sheet for Patients: EntrepreneurPulse.com.au  Fact Sheet for Healthcare Providers: IncredibleEmployment.be  This test is not yet approved or cleared by the [place] FDA and has been authorized for detection and/or diagnosis of SARS-CoV-2 by FDA under an Emergency Use Authorization (EUA). This EUA will remain in effect (meaning this test can be used) for the duration of the COVID-19 declaration under Section 564(b)(1) of the Act, 21 U.S.C. section 360bbb-3(b)(1), unless the authorization is terminated or revoked.  Performed at [facility], [place] [address] [place],  [address]       Radiology Studies: No results found.   LOS: 4 days   Antonieta Pert, MD [facility]  03/03/2021, 12:53 PM

## 2021-03-04 ENCOUNTER — Other Ambulatory Visit: Payer: Self-pay

## 2021-03-04 DIAGNOSIS — D649 Anemia, unspecified: Secondary | ICD-10-CM | POA: Diagnosis not present

## 2021-03-04 DIAGNOSIS — I10 Essential (primary) hypertension: Secondary | ICD-10-CM | POA: Diagnosis not present

## 2021-03-04 DIAGNOSIS — N184 Chronic kidney disease, stage 4 (severe): Secondary | ICD-10-CM | POA: Diagnosis not present

## 2021-03-04 DIAGNOSIS — I5041 Acute combined systolic (congestive) and diastolic (congestive) heart failure: Secondary | ICD-10-CM | POA: Diagnosis not present

## 2021-03-04 DIAGNOSIS — E8779 Other fluid overload: Secondary | ICD-10-CM | POA: Diagnosis not present

## 2021-03-04 DIAGNOSIS — E1129 Type 2 diabetes mellitus with other diabetic kidney complication: Secondary | ICD-10-CM | POA: Diagnosis not present

## 2021-03-04 DIAGNOSIS — Z794 Long term (current) use of insulin: Secondary | ICD-10-CM | POA: Diagnosis not present

## 2021-03-04 DIAGNOSIS — I129 Hypertensive chronic kidney disease with stage 1 through stage 4 chronic kidney disease, or unspecified chronic kidney disease: Secondary | ICD-10-CM | POA: Diagnosis not present

## 2021-03-04 DIAGNOSIS — N179 Acute kidney failure, unspecified: Secondary | ICD-10-CM | POA: Diagnosis not present

## 2021-03-04 DIAGNOSIS — R601 Generalized edema: Secondary | ICD-10-CM | POA: Diagnosis not present

## 2021-03-04 LAB — RENAL FUNCTION PANEL
Albumin: 2 g/dL — ABNORMAL LOW (ref 3.5–5.0)
Anion gap: 8 (ref 5–15)
BUN: 61 mg/dL — ABNORMAL HIGH (ref 6–20)
CO2: 26 mmol/L (ref 22–32)
Calcium: 8.3 mg/dL — ABNORMAL LOW (ref 8.9–10.3)
Chloride: 110 mmol/L (ref 98–111)
Creatinine, Ser: 2.78 mg/dL — ABNORMAL HIGH (ref 0.61–1.24)
GFR, Estimated: 27 mL/min — ABNORMAL LOW (ref >60)
Glucose, Bld: 209 mg/dL — ABNORMAL HIGH (ref 70–99)
Phosphorus: 4.8 mg/dL — ABNORMAL HIGH (ref 2.5–4.6)
Potassium: 4.1 mmol/L (ref 3.5–5.1)
Sodium: 144 mmol/L (ref 135–145)

## 2021-03-04 LAB — BASIC METABOLIC PANEL
Anion gap: 8 (ref 5–15)
BUN: 59 mg/dL — ABNORMAL HIGH (ref 6–20)
CO2: 26 mmol/L (ref 22–32)
Calcium: 8.3 mg/dL — ABNORMAL LOW (ref 8.9–10.3)
Chloride: 110 mmol/L (ref 98–111)
Creatinine, Ser: 2.76 mg/dL — ABNORMAL HIGH (ref 0.61–1.24)
GFR, Estimated: 27 mL/min — ABNORMAL LOW (ref >60)
Glucose, Bld: 210 mg/dL — ABNORMAL HIGH (ref 70–99)
Potassium: 4.1 mmol/L (ref 3.5–5.1)
Sodium: 144 mmol/L (ref 135–145)

## 2021-03-04 LAB — GLUCOSE, CAPILLARY
Glucose-Capillary: 173 mg/dL — ABNORMAL HIGH (ref 70–99)
Glucose-Capillary: 183 mg/dL — ABNORMAL HIGH (ref 70–99)
Glucose-Capillary: 186 mg/dL — ABNORMAL HIGH (ref 70–99)
Glucose-Capillary: 200 mg/dL — ABNORMAL HIGH (ref 70–99)
Glucose-Capillary: 226 mg/dL — ABNORMAL HIGH (ref 70–99)

## 2021-03-04 NOTE — Progress Notes (Signed)
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  He presented 02/27/2021 with leg edema and increased anasarca over the past month.  Last 2D echo showed ejection fraction 40%.  He is having a great diuresis with IV Lasix and metolazone.  Blood pressure 150/85 pulse 79 temperature 98.7 O2 sats 97% room air  Sodium 144 potassium 4.1 chloride 110 CO2 26 BUN 59 creatinine 2.7 glucose 210 calcium 8.3 hemoglobin 9.4  Urine output 4.2 L 03/03/2021 weight down 117 kg  Objective:  Vital signs in last 24 hours:  Temp:  [98.3 F (36.8 C)-98.7 F (37.1 C)] 98.7 F (37.1 C) (08/31 0520) Pulse Rate:  [75-79] 78 (08/31 0520) Resp:  [16-18] 16 (08/31 0520) BP: (150-166)/(85-96) 150/85 (08/31 0520) SpO2:  [96 %-100 %] 97 % (08/31 0520) Weight:  [117.2 kg] 117.2 kg (08/31 0539)  Weight change: 0 kg Filed Weights   03/03/21 0632 03/03/21 0915 03/04/21 0539  Weight: 120.3 kg 120.3 kg 117.2 kg    Intake/Output: I/O last 3 completed shifts: In: 1382.5 [P.O.:960; I.V.:86.5; IV Piggyback:336] Out: 6300 [Urine:6300]   Intake/Output this shift:  Total I/O In: 240 [P.O.:240] Out: -    alert, nad   no jvd  Chest dec'd bilat at bases, no rales/ wheezing  RRR no MRG  Abd soft ntnd no mass or ascites +bs Ext 2- 3+ bilat pitting leg/hip / scrotal/ abd and thoracic wall edema Neuro is alert, Ox 3 , nf, no asterixis      Basic Metabolic Panel: Recent Labs  Lab 02/27/21 0622 02/28/21 0536 03/01/21 0517 03/02/21 0428 03/03/21 0459 03/04/21 0454  NA 141 146* 143 142 140  140 144  144  K 3.8 4.0 4.0 4.1 3.8  3.8 4.1  4.1  CL 113* 117* 113* 112* 111  111 110  110  CO2 22 21* '23 22 22  23 26  26  '$ GLUCOSE 225* 146* 156* 158* 230*  230* 209*  210*  BUN 51* 54* 53* 58* 56*  54* 61*  59*  CREATININE 2.64*  2.50* 2.48* 2.67* 2.56* 2.69*  2.44* 2.78*   2.76*  CALCIUM 8.0* 8.4* 8.2* 8.6* 8.2*  8.2* 8.3*  8.3*  MG 1.8 1.9  --   --   --   --   PHOS  --   --   --   --  4.9* 4.8*     Liver Function Tests: Recent Labs  Lab 02/27/21 0622 03/03/21 0459 03/04/21 0454  AST 26  --   --   ALT 32  --   --   ALKPHOS 344*  --   --   BILITOT 1.2  --   --   PROT 6.5  --   --   ALBUMIN 2.0* 2.1* 2.0*    No results for input(s): LIPASE, AMYLASE in the last 168 hours. No results for input(s): AMMONIA in the last 168 hours.  CBC: Recent Labs  Lab 02/27/21 0622 02/28/21 0536 03/01/21 0517 03/03/21 0459  WBC 7.2 7.0 6.1 7.2  NEUTROABS 5.6  --   --   --   HGB 9.1* 8.7* 8.5* 9.4*  HCT 30.1* 27.9* 27.6* 31.3*  MCV 95.0 90.6 91.1 94.0  PLT 218 229 209 221     Cardiac Enzymes: No results for input(s): CKTOTAL, CKMB, CKMBINDEX, TROPONINI in the last 168 hours.  BNP: Invalid input(s): POCBNP  CBG: Recent  Labs  Lab 03/02/21 2051 03/03/21 0728 03/03/21 1121 03/03/21 1559 03/04/21 0805  GLUCAP 226* 214* 186* 162* 186*     Microbiology: Results for orders placed or performed during the hospital encounter of 02/27/21  Resp Panel by RT-PCR (Flu A&B, Covid) Nasopharyngeal Swab     Status: None   Collection Time: 02/27/21  5:26 AM   Specimen: Nasopharyngeal Swab; Nasopharyngeal(NP) swabs in vial transport medium  Result Value Ref Range Status   SARS Coronavirus 2 by RT PCR NEGATIVE NEGATIVE Final    Comment: (NOTE) SARS-CoV-2 target nucleic acids are NOT DETECTED.  The SARS-CoV-2 RNA is generally detectable in upper respiratory specimens during the acute phase of infection. The lowest concentration of SARS-CoV-2 viral copies this assay can detect is 138 copies/mL. A negative result does not preclude SARS-Cov-2 infection and should not be used as the sole basis for treatment or other patient management decisions. A negative result may occur with  improper specimen collection/handling, submission of specimen other than  nasopharyngeal swab, presence of viral mutation(s) within the areas targeted by this assay, and inadequate number of viral copies(<138 copies/mL). A negative result must be combined with clinical observations, patient history, and epidemiological information. The expected result is Negative.  Fact Sheet for Patients:  EntrepreneurPulse.com.au  Fact Sheet for Healthcare Providers:  IncredibleEmployment.be  This test is no t yet approved or cleared by the [place] FDA and  has been authorized for detection and/or diagnosis of SARS-CoV-2 by FDA under an Emergency Use Authorization (EUA). This EUA will remain  in effect (meaning this test can be used) for the duration of the COVID-19 declaration under Section 564(b)(1) of the Act, 21 U.S.C.section 360bbb-3(b)(1), unless the authorization is terminated  or revoked sooner.       Influenza A by PCR NEGATIVE NEGATIVE Final   Influenza B by PCR NEGATIVE NEGATIVE Final    Comment: (NOTE) The Xpert Xpress SARS-CoV-2/FLU/RSV plus assay is intended as an aid in the diagnosis of influenza from Nasopharyngeal swab specimens and should not be used as a sole basis for treatment. Nasal washings and aspirates are unacceptable for Xpert Xpress SARS-CoV-2/FLU/RSV testing.  Fact Sheet for Patients: EntrepreneurPulse.com.au  Fact Sheet for Healthcare Providers: IncredibleEmployment.be  This test is not yet approved or cleared by the [place] FDA and has been authorized for detection and/or diagnosis of SARS-CoV-2 by FDA under an Emergency Use Authorization (EUA). This EUA will remain in effect (meaning this test can be used) for the duration of the COVID-19 declaration under Section 564(b)(1) of the Act, 21 U.S.C. section 360bbb-3(b)(1), unless the authorization is terminated or revoked.  Performed at [facility], [place] [address] [place], [place] [address]     Coagulation Studies: No results for input(s): LABPROT, INR in the last 72 hours.  Urinalysis: No results for input(s): COLORURINE, LABSPEC, PHURINE, GLUCOSEU, HGBUR, BILIRUBINUR, KETONESUR, PROTEINUR, UROBILINOGEN, NITRITE, LEUKOCYTESUR in the last 72 hours.  Invalid input(s): APPERANCEUR     Imaging: No results found.   Medications:    sodium chloride 250 mL (03/03/21 1013)   furosemide 120 mg (03/04/21 0535)   iron sucrose 200 mg (03/03/21 0943)    atorvastatin  20 mg Oral Daily   carvedilol  25 mg Oral BID WC   ferrous sulfate  325 mg Oral TID WC   gabapentin  300 mg Oral BID   heparin  5,000 Units Subcutaneous Q8H   hydrALAZINE  75 mg Oral Q6H   insulin aspart  0-15 Units  Subcutaneous TID WC   insulin aspart  0-5 Units Subcutaneous QHS   isosorbide mononitrate  30 mg Oral Daily   metolazone  5 mg Oral BID   potassium chloride  10 mEq Oral BID   sodium chloride flush  3 mL Intravenous Q12H   sodium chloride, acetaminophen, hydrALAZINE, ondansetron (ZOFRAN) IV, oxyCODONE, polyethylene glycol, polyvinyl alcohol, sodium chloride flush  Assessment/ Plan:  CKD IV - progressive over the last 1-2 years. Proteinuria is worse here now than earlier this year. 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 Alb 2.0. He has massive edema up to the scapulae bilat, also pleural effusions and possibly pulm edema, not symptomatic at rest. On nasal O2. Continue IV lasix 120 mg q8 hrs, and po zaroxolyn 5 bid.  Diuresing well . Creat stable.  Will follow.  DM on insulin Vol overload - massive volume overload HTN - let's avoid ARB/ acei while diuresing, can use other agents.  Anemia.  Iron sats 13% we will use some IV iron Bone mineral check renal panel daily and PTH    LOS: 5 Sherril Croon '@TODAY''@10'$ :19 AM

## 2021-03-04 NOTE — Progress Notes (Addendum)
Progress Note  Patient Name: Todd Mccoy Date of Encounter: 03/04/2021  [facility] Cardiologist: Fransico Him, MD   Subjective   No acute overnight events. Patient still markedly volume overloaded. No specific complaints this morning. He reports some mild shortness of breath "once in a blue moon" but none now. No orthopnea. No recurrent chest pain.  Inpatient Medications    Scheduled Meds:  atorvastatin  20 mg Oral Daily   carvedilol  25 mg Oral BID WC   ferrous sulfate  325 mg Oral TID WC   gabapentin  300 mg Oral BID   heparin  5,000 Units Subcutaneous Q8H   hydrALAZINE  75 mg Oral Q6H   insulin aspart  0-15 Units Subcutaneous TID WC   insulin aspart  0-5 Units Subcutaneous QHS   isosorbide mononitrate  30 mg Oral Daily   metolazone  5 mg Oral BID   potassium chloride  10 mEq Oral BID   sodium chloride flush  3 mL Intravenous Q12H   Continuous Infusions:  sodium chloride 250 mL (03/03/21 1013)   furosemide 120 mg (03/04/21 0535)   iron sucrose 200 mg (03/03/21 0943)   PRN Meds: sodium chloride, acetaminophen, hydrALAZINE, ondansetron (ZOFRAN) IV, oxyCODONE, polyethylene glycol, polyvinyl alcohol, sodium chloride flush   Vital Signs    Vitals:   03/03/21 1400 03/03/21 2107 03/04/21 0520 03/04/21 0539  BP: (!) 166/96 (!) 158/91 (!) 150/85   Pulse: 75 79 78   Resp: '18 18 16   '$ Temp: 98.3 F (36.8 C) 98.7 F (37.1 C) 98.7 F (37.1 C)   TempSrc: Oral Oral Oral   SpO2: 100% 96% 97%   Weight:    117.2 kg  Height:        Intake/Output Summary (Last 24 hours) at 03/04/2021 0927 Last data filed at 03/04/2021 0600 Gross per 24 hour  Intake 1320.42 ml  Output 3325 ml  Net -2004.58 ml   Last 3 Weights 03/04/2021 03/03/2021 03/03/2021  Weight (lbs) 258 lb 6.1 oz 265 lb 3.4 oz 265 lb 3.4 oz  Weight (kg) 117.2 kg 120.3 kg 120.3 kg      Telemetry    Normal sinus rhythm with rates in the 70s to 90s.- Personally Reviewed  ECG    No new ECG tracing today.  - Personally Reviewed  Physical Exam   GEN: Obese African-American male. No acute distress. Legally blind. Neck: JVD elevated. Cardiac: RRR. Distinct S1 and S2. Gallop present. No murmurs or rubs.  Respiratory: Mild crackles and decreased breath sounds in bases. GI: Soft, distended, and non-tender. MS: Anasarca with pitting edema of bilateral upper and lower extremity edema. 2-3+ pitting edema of lower extremity extending up to waist. S/p left BKA. Right leg is wrapped. Skin: Warm and dry. Neuro:  No focal deficits. Psych: Normal affect. Responds approriately.  Labs    High Sensitivity Troponin:  No results for input(s): TROPONINIHS in the last 720 hours.    Chemistry Recent Labs  Lab 02/27/21 0622 02/28/21 0536 03/02/21 0428 03/03/21 0459 03/04/21 0454  NA 141   < > 142 140  140 144  144  K 3.8   < > 4.1 3.8  3.8 4.1  4.1  CL 113*   < > 112* 111  111 110  110  CO2 22   < > '22 22  23 26  26  '$ GLUCOSE 225*   < > 158* 230*  230* 209*  210*  BUN 51*   < > 58* 56*  54* 61*  59*  CREATININE 2.64*  2.50*   < > 2.56* 2.69*  2.44* 2.78*  2.76*  CALCIUM 8.0*   < > 8.6* 8.2*  8.2* 8.3*  8.3*  PROT 6.5  --   --   --   --   ALBUMIN 2.0*  --   --  2.1* 2.0*  AST 26  --   --   --   --   ALT 32  --   --   --   --   ALKPHOS 344*  --   --   --   --   BILITOT 1.2  --   --   --   --   GFRNONAA 29*  31*   < > 30* 28*  32* 27*  27*  ANIONGAP 6   < > '8 7  6 8  8   '$ < > = values in this interval not displayed.     Hematology Recent Labs  Lab 02/28/21 0536 03/01/21 0517 03/03/21 0459  WBC 7.0 6.1 7.2  RBC 3.08* 3.03* 3.33*  HGB 8.7* 8.5* 9.4*  HCT 27.9* 27.6* 31.3*  MCV 90.6 91.1 94.0  MCH 28.2 28.1 28.2  MCHC 31.2 30.8 30.0  RDW 18.7* 18.7* 18.6*  PLT 229 209 221    BNP Recent Labs  Lab 02/27/21 0622  BNP >4,500.0*     DDimer No results for input(s): DDIMER in the last 168 hours.   Radiology    No results found.  Cardiac Studies    Echocardiogram 02/27/2021: Impressions: 1. Left ventricular ejection fraction, by estimation, is 40%. The left  ventricle has moderately decreased function. The left ventricle  demonstrates global hypokinesis. There is moderate-severe left ventricular  hypertrophy. Left ventricular diastolic  parameters are consistent with Grade III diastolic dysfunction  (restrictive).   2. Right ventricular systolic function is moderately reduced. The right  ventricular size is mildly enlarged. Moderately increased right  ventricular wall thickness. There is moderately elevated pulmonary artery  systolic pressure. The estimated right  ventricular systolic pressure is [id number] mmHg.   3. Left atrial size was moderately dilated.   4. A small pericardial effusion is present. The pericardial effusion is  circumferential. There is no evidence of cardiac tamponade. Large pleural  effusion.   5. The mitral valve is grossly normal. Trivial mitral valve  regurgitation. No evidence of mitral stenosis.   6. Tricuspid valve regurgitation is moderate.   7. The aortic valve is tricuspid. Aortic valve regurgitation is not  visualized. No aortic stenosis is present.   8. The inferior vena cava is normal in size with <50% respiratory  variability, suggesting right atrial pressure of 8 mmHg.  _______________  Renal Artery Ultrasound 02/28/2021: Summary: Renal:  - Right: Normal size right kidney. RRV flow present. Normal right Resisitive Index. No evidence of right renal artery stenosis.  - Left:  Normal left Resistive Index. No evidence of stenosis in visualized segment; however, views were extremely limited during today's examination.   Mesenteric:  Normal Celiac artery and Superior Mesenteric artery findings.    Patient Profile     50 y.o. male  who is legally blind with a history of poorly controlled hypertension, insulin dependent diabetes mellitus, CKD stage IV, anemia, osteomyelitis s/p prior right 2nd toe  amputation in 2021 and recent left below knee amputation in 12/2020, ongoing tobacco use with a 20 year smoking history, and morbid obesity who was admitted with anasarca.   Assessment & Plan  Anasarca Acute Combined CHF - Patient presented with significant scrotal edema and lower extremity edema for the past several months. Felt to be renal anasarca by PCP and he was referred to Nephrology. - BNP >4,500. - Chest x-ray showed cardiomegaly with progression of pulmonary edema and bilateral pleural effusion (right > left). - Echo showed LVEF of 40% with global hypokinesis, moderate to severe LVH, and grade III diastolic dysfunction. RV mildly enlarged with moderately reduced systolic function and moderate pulmonary hypertension. - Nephrology has been managing diuretics. Currently on IV Lasix '120mg'$  three times daily and Metolazone '5mg'$  twice daily. Documented urinary output of 4.85 L yesterday. Net negative 11.9 L since admission. Weight 258 lbs today, down 7 lbs from yesterday and 22 lbs since admission. - Still markedly volume overloaded. - Continue diuresis per Nephrology. - No ACEi/ARB/ARNI or MRA given renal function. - Continue Coreg '25mg'$  twice daily.  - Continue  Hydralazine '75mg'$  every four time daily.  - Continue Imdur '30mg'$  daily. - Cardiomyopathy mostly likely due to poorly controlled hypertension with secondary RV dysfunction from pulmonary hypertension. However, he does have multiple CV risk factors (HTN, DM, tobacco abuse). Will need an ischemic evaluation at some point. Not a cath or coronary CTA candidate at thsi time due to renal function.  Chest Pain - Patient had an episode of atypical chest pain yesterday. No recurrence. - Will need ischemic work-up at some point as noted above.  Uncontrolled Hypertension - BP improved but still mildly elevated in the 140s-160s/80-90s. - Current medications include: Coreg '25mg'$  twice daily, Hydralazine '75mg'$  four times daily, and Imdur '30mg'$   daily.  - May improve as we continue to diuresis. - Recommend outpatient sleep study.  Type 2 Diabetes Mellitus - Management per primary team.  Hypoalbuminemia - Albumin 2.0. This is certainly contributing to anasarca. - Management per primary team.  CKD Stage IV - Patient has had progressive CKD over the last 1-2 years.  - Creatinine 2.50 on admission. Peaked at 2.78 today.  - Renal artery ultrasound unremarkable. - Nephrology consulted and feel he likely has progressive diabetic nephropathy with secondary nephrotic syndrome.  Chronic Anemia - Hemoglobin stable at 9.4.  Otherwise, per primary team.  For questions or updates, please contact [place] Please consult [email or web address] for contact info under      Signed, Darreld Mclean, PA-C  03/04/2021, 9:27 AM    Patient seen and examined.  Agree with above documentation.  On exam, patient is alert and oriented, regular rate and rhythm, no murmurs, diminished breath sounds, +JVD, s/p LLE BKA, RLE wrapped.  Net -3.5 L yesterday.  Creatinine stable at 2.8.  Continue IV diuresis.  Donato Heinz, MD

## 2021-03-04 NOTE — TOC Initial Note (Signed)
Transition of Care [facility]) - Initial/Assessment Note    Patient Details  Name: Todd Mccoy MRN: [id number] Date of Birth: October 06, 1970  Transition of [facility]) CM/SW Contact:    Dessa Phi, RN Phone Number: 03/04/2021, 3:07 PM  Clinical Narrative:  Patietn is L BKA,legally blind from home-left vm w/Gertrude(mother) await call back to discuss d/c plans. Noted has w/c,bsc needs wider size-will confirm dme needs.                 Expected Discharge Plan: [place] Barriers to Discharge: Continued Medical Work up   Patient Goals and CMS Choice Patient states their goals for this hospitalization and ongoing recovery are:: go home CMS Medicare.gov Compare Post Acute Care list provided to:: Patient Represenative (must comment) Molli Posey mother [phone]) Choice offered to / list presented to : Parent  Expected Discharge Plan and Services Expected Discharge Plan: [place]   Discharge Planning Services: CM Consult   Living arrangements for the past 2 months: Single Family Home                                      Prior Living Arrangements/Services Living arrangements for the past 2 months: Single Family Home Lives with:: Parents Patient language and need for interpreter reviewed:: Yes Do you feel safe going back to the place where you live?: Yes      Need for Family Participation in Patient Care: No (Comment) Care giver support system in place?: Yes (comment)   Criminal Activity/Legal Involvement Pertinent to Current Situation/Hospitalization: No - Comment as needed  Activities of Daily Living Home Assistive Devices/Equipment: Eyeglasses, CBG Meter, Blood pressure cuff, Crutches, Shower chair with back, Cane (specify quad or straight), Walker (specify type), Wheelchair, Other (Comment) (tub/shower unit, standard height toilet, single point cane, 2/4 wheeled walker, amnaual wheelchair, tub bench) ADL Screening (condition at  time of admission) Patient's cognitive ability adequate to safely complete daily activities?: Yes Is the patient deaf or have difficulty hearing?: No Does the patient have difficulty seeing, even when wearing glasses/contacts?: Yes Does the patient have difficulty concentrating, remembering, or making decisions?: No Patient able to express need for assistance with ADLs?: Yes Does the patient have difficulty dressing or bathing?: No Independently performs ADLs?: No Communication: Independent Dressing (OT): Independent Grooming: Independent Feeding: Independent Bathing: Independent Toileting: Independent with device (comment) In/Out Bed: Independent Walks in Home: Independent with device (comment) Does the patient have difficulty walking or climbing stairs?: Yes (left bka) Weakness of Legs: Right (left bka) Weakness of Arms/Hands: None  Permission Sought/Granted Permission sought to share information with : Case Manager Permission granted to share information with : Yes, Verbal Permission Granted  Share Information with NAME: Case Manager     Permission granted to share info w Relationship: Molli Posey mother [phone]     Emotional Assessment Appearance:: Appears stated age Attitude/Demeanor/Rapport: Gracious Affect (typically observed): Accepting Orientation: : Oriented to Self, Oriented to Place, Oriented to  Time, Oriented to Situation Alcohol / Substance Use: Not Applicable Psych Involvement: No (comment)  Admission diagnosis:  Anasarca [R60.1] Acute CHF (congestive heart failure) (HCC) [I50.9] Uncontrolled hypertension [I10] Patient Active Problem List   Diagnosis Date Noted   Chronic renal disease, stage 4, severely decreased glomerular filtration rate between 15-29 mL/min/1.73 square meter (HCC)    Hypertensive urgency    AKI (acute kidney injury) ([place])  Acute CHF (congestive heart failure) ([place]) 02/27/2021   Normocytic anemia 02/27/2021   Anasarca 02/27/2021    Abscess of left foot 12/10/2020   S/P BKA (below knee amputation) unilateral, left ([place]) 12/10/2020   Dyslipidemia 12/10/2020   CKD (chronic kidney disease), stage III ([place]) 12/10/2020   Subacute osteomyelitis, left ankle and foot (HCC)    Osteomyelitis of fifth toe of left foot (HCC)    Severe protein-calorie malnutrition ([place])    HTN (hypertension) 11/12/2020   Osteomyelitis of second toe of right foot ([place])    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 [place] [address] Phone: [phone] Fax: [phone]     Social Determinants of Health (SDOH) Interventions    Readmission Risk Interventions No flowsheet data found.

## 2021-03-04 NOTE — Progress Notes (Signed)
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 In ED: "Temperature 97.5, pulse 82, RR: 18, blood pressure 225/176, maintaining oxygen saturation on room air.  BNP more than 4500, 5 0, BUN 51, GFR: 31, H&H: 9.1/30.1, COVID-19 negative.  Chest x-ray shows progressive pulmonary edema and bilateral pleural effusion greater on right side.  Patient was given Lasix 80 IV once in ED.ther evaluation." Cardiology, nephrology consulted. He was placed on high-dose IV Lasix with IV albumin x 6 doses and metalozone and diuresing well.  Subjective: Denies any chest pain or shortness of breath.  He has had good urine output over the past 24 hours.  Assessment & Plan:  Shortness of breath, leg swelling/Anasarca, progressively worsening x 1 month Volume overload Acute combined systolic/diastolic CHF superimposed on baseline renal insufficiency and hypoalbuminemia  Echo showed EF of 40%, grade 3 DD, moderate to severe left ventricular hypertrophy. 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  Cardiology and nephrology following closely, appreciate input .  Diuresing very well, net negative volume balance close to -12 liters. Cont on Lasix 120 mg q6 w/ metolazone 5 mg bid/ s/p IV albumin x6 doses. Continue to monitor I's/O, Daily weight, renal function.  Admission weight 280  pounds now down to 258  Net IO Since Admission: -12,463.55 mL [03/04/21 1819]  Filed Weights   03/03/21 [id number] 03/03/21 0915 03/04/21 0539  Weight: 120.3 kg 120.3 kg 117.2 kg    Intake/Output Summary (Last 24 hours) at 03/04/2021 1819 Last data filed at 03/04/2021 0900 Gross per 24 hour  Intake 600.42 ml  Output 2150 ml  Net -1549.58 ml    Accelerated hypertension, bp 225/176 in ED. BP borderline allow room for diuresis given high-dose Lasix IV Lasix.  Continue on increased dose of hydralazine to 75 mg  q6 8/28. Home dose Coreg,  and Imdur.  Status post clonidine x1  8/27 am.  Monitor and optimize.  Will need outpatient sleep study.  DM2 with neuropathy/retinopathy, HbA1c 8.6, blood sugar well controlled on sliding scale.  Recent Labs  Lab 03/03/21 1559 03/03/21 2108 03/04/21 0805 03/04/21 1203 03/04/21 1600  GLUCAP 162* 183* 186* 200* 226*    CKD IV: Creatinine stable 2.4,> 2.6.  Monitor closely while on Lasix , he is tolerating very well.  Recent Labs    12/22/20 0129 01/21/21 1438 02/11/21 1740 02/17/21 0600 02/27/21 0622 02/28/21 0536 03/01/21 0517 03/02/21 0428 03/03/21 0459 03/04/21 0454  BUN 117* 42* 44* 44* 51* 54* 53* 58* 56*  54* 61*  59*  CREATININE 3.26* 2.56* 2.44* 2.53* 2.64*  2.50* 2.48* 2.67* 2.56* 2.69*  2.44* 2.78*  2.76*    Normocytic anemia-anemia remained stable, monitor.  Nephrology evaluating for ESA= iron at 28 Recent Labs  Lab 02/27/21 0622 02/28/21 0536 03/01/21 0517 03/03/21 0459  HGB 9.1* 8.7* 8.5* 9.4*  HCT 30.1* 27.9* 27.6* 31.3*   Somnolence at at times: It has resolved.  ABG no acute finding.    Tobacco abuse-counseled on importance of tobacco cessation  S/P BKA left 12/2020-continue supportive care, he is wheelchair-WC bound  Dyslipidemia-cont statin  Class III morbid obesity BMI 41: Outpatient follow-up with PCP loss, healthy lifestyle.  Sleep apnea evaluation   Diet Order             Diet 2 gram sodium Room service  appropriate? Yes; Fluid consistency: Thin  Diet effective now                   Patient's Body mass index is 37.07 kg/m.  DVT prophylaxis: heparin injection 5,000 Units Start: 02/27/21 0800 SCDs Start: 02/27/21 [id number] Code Status:   Code Status: Full Code   Family Communication: plan of care discussed with patient at bedside.  Status is: Inpatient Remains inpatient appropriate because:IV treatments appropriate due to intensity of illness or inability to take PO and Inpatient level of care appropriate due to severity of illness Dispo: The patient is from: Home              Anticipated d/c is to: Home in > 4-5 days              Patient currently is not medically stable to d/c.   Difficult to place patient No Unresulted Labs (From admission, onward)     Start     Ordered   03/03/21 0500  CBC  Every 48 hours,   R (with TIMED occurrences)      03/01/21 1751   03/03/21 0500  Renal function panel  Daily,   R      03/02/21 0814   02/27/21 1657  Creatinine, urine, random  Once,   R        02/27/21 1656   02/27/21 1657  Sodium, urine, random  Once,   R        02/27/21 1656           Medications reviewed:  Scheduled Meds:  atorvastatin  20 mg Oral Daily   carvedilol  25 mg Oral BID WC   ferrous sulfate  325 mg Oral TID WC   gabapentin  300 mg Oral BID   heparin  5,000 Units Subcutaneous Q8H   hydrALAZINE  75 mg Oral Q6H   insulin aspart  0-15 Units Subcutaneous TID WC   insulin aspart  0-5 Units Subcutaneous QHS   isosorbide mononitrate  30 mg Oral Daily   metolazone  5 mg Oral BID   potassium chloride  10 mEq Oral BID   sodium chloride flush  3 mL Intravenous Q12H   Continuous Infusions:  sodium chloride 250 mL (03/03/21 1013)   furosemide 120 mg (03/04/21 1457)   iron sucrose 200 mg (03/04/21 1407)   Consultants:see note  Procedures:see note Antimicrobials: Anti-infectives (From admission, onward)    None      Culture/Microbiology    Component Value  Date/Time   SDES URINE, RANDOM 12/21/2020 0727   SPECREQUEST  12/21/2020 0727    NONE Performed at [facility], [place] [address], [place], [place] [address]    CULT (A) 12/21/2020 0727    30,000 COLONIES/mL MULTIPLE SPECIES PRESENT, SUGGEST RECOLLECTION   REPTSTATUS 12/22/2020 FINAL 12/21/2020 0727    Other culture-see note  Objective: Vitals: Today's Vitals   03/04/21 0055 03/04/21 0520 03/04/21 0539 03/04/21 1322  BP:  (!) 150/85  137/79  Pulse:  78  82  Resp:  16  16  Temp:  98.7 F (  37.1 C)  98.5 F (36.9 C)  TempSrc:  Oral  Oral  SpO2:  97%  96%  Weight:   117.2 kg   Height:      PainSc: 0-No pain   0-No pain    Intake/Output Summary (Last 24 hours) at 03/04/2021 1819 Last data filed at 03/04/2021 0900 Gross per 24 hour  Intake 600.42 ml  Output 2150 ml  Net -1549.58 ml   Filed Weights   03/03/21 [id number] 03/03/21 0915 03/04/21 0539  Weight: 120.3 kg 120.3 kg 117.2 kg   Weight change: 0 kg  Intake/Output from previous day: 08/30 0701 - 08/31 0700 In: 1320.4 [P.O.:960; I.V.:86.5; IV Piggyback:273.9] Out: 4850 [Urine:4850] Intake/Output this shift: Total I/O In: 240 [P.O.:240] Out: 800 [Urine:800] Filed Weights   03/03/21 0632 03/03/21 0915 03/04/21 0539  Weight: 120.3 kg 120.3 kg 117.2 kg   Examination: General exam: Alert, awake, oriented x 3 Respiratory system: Crackles at bases. Respiratory effort normal. Cardiovascular system:RRR. No murmurs, rubs, gallops. Gastrointestinal system: Abdomen is nondistended, soft and nontender. No organomegaly or masses felt. Normal bowel sounds heard. Central nervous system: Alert and oriented. No focal neurological deficits. Extremities: Positive right lower extremity edema, left lower extremity BKA Skin: No rashes, lesions or ulcers Psychiatry: Judgement and insight appear normal. Mood & affect appropriate.    Data Reviewed: I have personally reviewed following labs and imaging studies CBC: Recent Labs  Lab  02/27/21 0622 02/28/21 0536 03/01/21 0517 03/03/21 0459  WBC 7.2 7.0 6.1 7.2  NEUTROABS 5.6  --   --   --   HGB 9.1* 8.7* 8.5* 9.4*  HCT 30.1* 27.9* 27.6* 31.3*  MCV 95.0 90.6 91.1 94.0  PLT 218 229 209 [id number]   Basic Metabolic Panel: Recent Labs  Lab 02/27/21 0622 02/28/21 0536 03/01/21 0517 03/02/21 0428 03/03/21 0459 03/04/21 0454  NA 141 146* 143 142 140  140 144  144  K 3.8 4.0 4.0 4.1 3.8  3.8 4.1  4.1  CL 113* 117* 113* 112* 111  111 110  110  CO2 22 21* '23 22 22  23 26  26  '$ GLUCOSE 225* 146* 156* 158* 230*  230* 209*  210*  BUN 51* 54* 53* 58* 56*  54* 61*  59*  CREATININE 2.64*  2.50* 2.48* 2.67* 2.56* 2.69*  2.44* 2.78*  2.76*  CALCIUM 8.0* 8.4* 8.2* 8.6* 8.2*  8.2* 8.3*  8.3*  MG 1.8 1.9  --   --   --   --   PHOS  --   --   --   --  4.9* 4.8*   GFR: Estimated Creatinine Clearance: 42 mL/min (A) (by C-G formula based on SCr of 2.76 mg/dL (H)). Liver Function Tests: Recent Labs  Lab 02/27/21 0622 03/03/21 0459 03/04/21 0454  AST 26  --   --   ALT 32  --   --   ALKPHOS 344*  --   --   BILITOT 1.2  --   --   PROT 6.5  --   --   ALBUMIN 2.0* 2.1* 2.0*   No results for input(s): LIPASE, AMYLASE in the last 168 hours. No results for input(s): AMMONIA in the last 168 hours. Coagulation Profile: No results for input(s): INR, PROTIME in the last 168 hours. Cardiac Enzymes: No results for input(s): CKTOTAL, CKMB, CKMBINDEX, TROPONINI in the last 168 hours. BNP (last 3 results) No results for input(s): PROBNP in the last 8760 hours. HbA1C: No results for input(s): HGBA1C in the  last 72 hours.  CBG: Recent Labs  Lab 03/03/21 1559 03/03/21 2108 03/04/21 0805 03/04/21 1203 03/04/21 1600  GLUCAP 162* 183* 186* 200* 226*   Lipid Profile: No results for input(s): CHOL, HDL, LDLCALC, TRIG, CHOLHDL, LDLDIRECT in the last 72 hours. Thyroid Function Tests: No results for input(s): TSH, T4TOTAL, FREET4, T3FREE, THYROIDAB in the last 72  hours.  Anemia Panel: Recent Labs    03/02/21 0907  TIBC 214*  IRON 28*   Sepsis Labs: No results for input(s): PROCALCITON, LATICACIDVEN in the last 168 hours.  Recent Results (from the past 240 hour(s))  Resp Panel by RT-PCR (Flu A&B, Covid) Nasopharyngeal Swab     Status: None   Collection Time: 02/27/21  5:26 AM   Specimen: Nasopharyngeal Swab; Nasopharyngeal(NP) swabs in vial transport medium  Result Value Ref Range Status   SARS Coronavirus 2 by RT PCR NEGATIVE NEGATIVE Final    Comment: (NOTE) SARS-CoV-2 target nucleic acids are NOT DETECTED.  The SARS-CoV-2 RNA is generally detectable in upper respiratory specimens during the acute phase of infection. The lowest concentration of SARS-CoV-2 viral copies this assay can detect is 138 copies/mL. A negative result does not preclude SARS-Cov-2 infection and should not be used as the sole basis for treatment or other patient management decisions. A negative result may occur with  improper specimen collection/handling, submission of specimen other than nasopharyngeal swab, presence of viral mutation(s) within the areas targeted by this assay, and inadequate number of viral copies(<138 copies/mL). A negative result must be combined with clinical observations, patient history, and epidemiological information. The expected result is Negative.  Fact Sheet for Patients:  EntrepreneurPulse.com.au  Fact Sheet for Healthcare Providers:  IncredibleEmployment.be  This test is no t yet approved or cleared by the [place] FDA and  has been authorized for detection and/or diagnosis of SARS-CoV-2 by FDA under an Emergency Use Authorization (EUA). This EUA will remain  in effect (meaning this test can be used) for the duration of the COVID-19 declaration under Section 564(b)(1) of the Act, 21 U.S.C.section 360bbb-3(b)(1), unless the authorization is terminated  or revoked sooner.        Influenza A by PCR NEGATIVE NEGATIVE Final   Influenza B by PCR NEGATIVE NEGATIVE Final    Comment: (NOTE) The Xpert Xpress SARS-CoV-2/FLU/RSV plus assay is intended as an aid in the diagnosis of influenza from Nasopharyngeal swab specimens and should not be used as a sole basis for treatment. Nasal washings and aspirates are unacceptable for Xpert Xpress SARS-CoV-2/FLU/RSV testing.  Fact Sheet for Patients: EntrepreneurPulse.com.au  Fact Sheet for Healthcare Providers: IncredibleEmployment.be  This test is not yet approved or cleared by the [place] FDA and has been authorized for detection and/or diagnosis of SARS-CoV-2 by FDA under an Emergency Use Authorization (EUA). This EUA will remain in effect (meaning this test can be used) for the duration of the COVID-19 declaration under Section 564(b)(1) of the Act, 21 U.S.C. section 360bbb-3(b)(1), unless the authorization is terminated or revoked.  Performed at [name], [place] [address], [place], [place] [address]       Radiology Studies: No results found.   LOS: 5 days   Kathie Dike, MD [facility]  03/04/2021, 6:19 PM

## 2021-03-04 NOTE — Progress Notes (Signed)
Inpatient Diabetes Program Recommendations  AACE/ADA: New Consensus Statement on Inpatient Glycemic Control   Target Ranges:  Prepandial:   less than 140 mg/dL      Peak postprandial:   less than 180 mg/dL (1-2 hours)      Critically ill patients:  140 - 180 mg/dL   Results for ROCKNE, SALDUTTI (MRN [id number]) as of 03/04/2021 10:10  Ref. Range 03/03/2021 07:28 03/03/2021 11:21 03/03/2021 15:59 03/04/2021 08:05  Glucose-Capillary Latest Ref Range: 70 - 99 mg/dL 214 (H) 186 (H) 162 (H) 186 (H)    Review of Glycemic Control  Diabetes history: DM2 Outpatient Diabetes medications: Lantus 20 units QHS Current orders for Inpatient glycemic control: Novolog 0-15 units TID with meals, Novolog 0-5 units QHS  Inpatient Diabetes Program Recommendations:    Insulin: Please consider ordering Novolog 3 units TID with meals for meal coverage if patient eats at least 50% of meals.  Thanks, Barnie Alderman, RN, MSN, CDE Diabetes Coordinator Inpatient Diabetes Program [phone] (Team Pager from 8am to 5pm)

## 2021-03-04 NOTE — Progress Notes (Signed)
Physical Therapy Treatment Patient Details Name: Todd Mccoy MRN: [id number] DOB: [birth year] Today's Date: 03/04/2021    History of Present Illness Patient is a 50 year old male presenting with diarrhea, swelling, shortness of breath for more than a month with multiple ED visits. Pt diagnosed with progressive pulmonary edema and bilateral pleural effusion, Acute combined systolic/diastolic CHF superimposed on baseline renal insufficiency and hypoalbuminemia, anascarsa. PMH includes L BKA 12/2020, DM, neuropathy, retinal damage L>R, HTN    PT Comments    Upon entry, pt complaining of being in bed, but declines getting OOB to recliner, requests therapist elevated HOB. Pt agreeable to quad set and SLR, then requests to stop due to fatigue. Attempted to educated and encourage pt with mobility and on the therapeutic process with guarded carryover. Will continue to progress acute PT as able.    Follow Up Recommendations  SNF (HHPT 24 hour supervision/assist if pt declines placement)     Equipment Recommendations  Other (comment) (pt reports WC and BSC are too small/narrow. May need home assessment and evaluation to see if wider equipment can be accommodated in home)    Recommendations for Other Services       Precautions / Restrictions Precautions Precautions: Fall Precaution Comments: monitor vitals, swollen scrotum, does not yet have prosthesis for L LE Restrictions Weight Bearing Restrictions: No    Mobility  Bed Mobility  General bed mobility comments: pt declines despite encouragement and education    Transfers  General transfer comment: pt declines despite encouragement and education  Ambulation/Gait    Stairs             Wheelchair Mobility    Modified Rankin (Stroke Patients Only)       Balance                     Cognition Arousal/Alertness: Awake/alert Behavior During Therapy: WFL for tasks assessed/performed Overall Cognitive Status:  Within Functional Limits for tasks assessed       Exercises General Exercises - Lower Extremity Quad Sets: Supine;AROM;Strengthening;Both;10 reps Straight Leg Raises: Supine;AROM;Strengthening;Both;10 reps    General Comments        Pertinent Vitals/Pain Pain Assessment: Faces Faces Pain Scale: Hurts little more Pain Location: RLE Pain Descriptors / Indicators: Sore ("weak") Pain Intervention(s): Limited activity within patient's tolerance;Monitored during session;Repositioned    Home Living                      Prior Function            PT Goals (current goals can now be found in the care plan section) Acute Rehab PT Goals Patient Stated Goal: get out of here, get some fresh air PT Goal Formulation: With patient Time For Goal Achievement: 03/14/21 Potential to Achieve Goals: Fair Progress towards PT goals: Not progressing toward goals - comment (self directed, guarded with education/encouragement)    Frequency    Min 2X/week      PT Plan Current plan remains appropriate    Co-evaluation              AM-PAC PT "6 Clicks" Mobility   Outcome Measure  Help needed turning from your back to your side while in a flat bed without using bedrails?: A Lot Help needed moving from lying on your back to sitting on the side of a flat bed without using bedrails?: A Lot Help needed moving to and from a bed to a chair (including a wheelchair)?: A Lot  Help needed standing up from a chair using your arms (e.g., wheelchair or bedside chair)?: Total Help needed to walk in hospital room?: Total Help needed climbing 3-5 steps with a railing? : Total 6 Click Score: 9    End of Session Equipment Utilized During Treatment: Gait belt Activity Tolerance: Patient tolerated treatment well (self directed) Patient left: in bed;with call bell/phone within reach;with bed alarm set Nurse Communication: Mobility status PT Visit Diagnosis: Pain;Muscle weakness (generalized)  (M62.81);Other abnormalities of gait and mobility (R26.89)     Time: [id number] PT Time Calculation (min) (ACUTE ONLY): 17 min  Charges:  $Therapeutic Exercise: 8-22 mins                      Tori Marieann Zipp PT, DPT 03/04/21, 1:06 PM

## 2021-03-04 NOTE — Progress Notes (Signed)
Occupational Therapy Treatment Patient Details Name: Todd Mccoy MRN: [id number] DOB: 13-Nov-1970 Today's Date: 03/04/2021    History of present illness Patient is a 50 year old male presenting with diarrhea, swelling, shortness of breath for more than a month with multiple ED visits. Pt diagnosed with progressive pulmonary edema and bilateral pleural effusion, Acute combined systolic/diastolic CHF superimposed on baseline renal insufficiency and hypoalbuminemia, anascarsa. PMH includes L BKA 12/2020, DM, neuropathy, retinal damage L>R, HTN   OT comments  Patient participated minimally in BUE therapeutic activity with noted self limiting behaviors in participation in sitting balance or time out of bed. Patient declined to participate in more tasks on this date stating he would " do it tomorrow". Patient's discharge plan remains appropriate at this time. OT will continue to follow acutely.    Follow Up Recommendations  SNF;Home health OT;Supervision/Assistance - 24 hour    Equipment Recommendations       Recommendations for Other Services      Precautions / Restrictions Precautions Precautions: Fall Precaution Comments: monitor vitals, swollen scrotum, does not yet have prosthesis for L LE Restrictions Weight Bearing Restrictions: No       Mobility Bed Mobility               General bed mobility comments: pt declines despite encouragement and education. patient reported i can do that no problem but would not demonstrate for various reasons    Transfers                 General transfer comment: pt declines despite encouragement and education    Balance                                           ADL either performed or assessed with clinical judgement   ADL                                               Vision       Perception     Praxis      Cognition Arousal/Alertness: Awake/alert Behavior During Therapy:  WFL for tasks assessed/performed Overall Cognitive Status: Within Functional Limits for tasks assessed                                          Exercises General Exercises - Upper Extremity Elbow Flexion: 10 reps;Supine (patient completed task with eyes closed with patient declining to follow education on further exercises.) General Exercises - Lower Extremity Quad Sets: Supine;AROM;Strengthening;Both;10 reps Straight Leg Raises: Supine;AROM;Strengthening;Both;10 reps   Shoulder Instructions       General Comments      Pertinent Vitals/ Pain       Pain Assessment: No/denies pain Faces Pain Scale: Hurts little more Pain Location: RLE Pain Descriptors / Indicators: Sore ("weak") Pain Intervention(s): Limited activity within patient's tolerance;Monitored during session;Repositioned  Home Living                                          Prior Functioning/Environment  Frequency  Min 2X/week        Progress Toward Goals  OT Goals(current goals can now be found in the care plan section)     Acute Rehab OT Goals Patient Stated Goal: get out of here, get some fresh air  Plan      Co-evaluation                 AM-PAC OT "6 Clicks" Daily Activity     Outcome Measure   Help from another person eating meals?: A Little Help from another person taking care of personal grooming?: A Little Help from another person toileting, which includes using toliet, bedpan, or urinal?: Total Help from another person bathing (including washing, rinsing, drying)?: A Lot Help from another person to put on and taking off regular upper body clothing?: A Little Help from another person to put on and taking off regular lower body clothing?: A Lot 6 Click Score: 14    End of Session    OT Visit Diagnosis: Other abnormalities of gait and mobility (R26.89);Pain   Activity Tolerance Other (comment) (patients perceived behaviors limited  participation)   Patient Left in bed;with call bell/phone within reach   Nurse Communication Other (comment) (nurse cleared patient to participate)        Time: [id number] OT Time Calculation (min): 12 min  Charges: OT General Charges $OT Visit: 1 Visit OT Treatments $Self Care/Home Management : 8-22 mins  Jackelyn Poling OTR/L, [place] Acute Rehabilitation Department Office# [phone] Pager# [phone]    [place] 03/04/2021, 4:18 PM

## 2021-03-05 DIAGNOSIS — I129 Hypertensive chronic kidney disease with stage 1 through stage 4 chronic kidney disease, or unspecified chronic kidney disease: Secondary | ICD-10-CM | POA: Diagnosis not present

## 2021-03-05 DIAGNOSIS — E8779 Other fluid overload: Secondary | ICD-10-CM | POA: Diagnosis not present

## 2021-03-05 DIAGNOSIS — I5041 Acute combined systolic (congestive) and diastolic (congestive) heart failure: Secondary | ICD-10-CM | POA: Diagnosis not present

## 2021-03-05 DIAGNOSIS — I10 Essential (primary) hypertension: Secondary | ICD-10-CM | POA: Diagnosis not present

## 2021-03-05 DIAGNOSIS — N184 Chronic kidney disease, stage 4 (severe): Secondary | ICD-10-CM | POA: Diagnosis not present

## 2021-03-05 DIAGNOSIS — R601 Generalized edema: Secondary | ICD-10-CM | POA: Diagnosis not present

## 2021-03-05 DIAGNOSIS — Z419 Encounter for procedure for purposes other than remedying health state, unspecified: Secondary | ICD-10-CM | POA: Diagnosis not present

## 2021-03-05 DIAGNOSIS — D649 Anemia, unspecified: Secondary | ICD-10-CM | POA: Diagnosis not present

## 2021-03-05 DIAGNOSIS — N179 Acute kidney failure, unspecified: Secondary | ICD-10-CM | POA: Diagnosis not present

## 2021-03-05 DIAGNOSIS — Z794 Long term (current) use of insulin: Secondary | ICD-10-CM | POA: Diagnosis not present

## 2021-03-05 DIAGNOSIS — E1129 Type 2 diabetes mellitus with other diabetic kidney complication: Secondary | ICD-10-CM | POA: Diagnosis not present

## 2021-03-05 LAB — GLUCOSE, CAPILLARY
Glucose-Capillary: 173 mg/dL — ABNORMAL HIGH (ref 70–99)
Glucose-Capillary: 152 mg/dL — ABNORMAL HIGH (ref 70–99)
Glucose-Capillary: 185 mg/dL — ABNORMAL HIGH (ref 70–99)

## 2021-03-05 LAB — RENAL FUNCTION PANEL
Albumin: 2 g/dL — ABNORMAL LOW (ref 3.5–5.0)
Anion gap: 9 (ref 5–15)
BUN: 59 mg/dL — ABNORMAL HIGH (ref 6–20)
CO2: 25 mmol/L (ref 22–32)
Calcium: 8 mg/dL — ABNORMAL LOW (ref 8.9–10.3)
Chloride: 106 mmol/L (ref 98–111)
Creatinine, Ser: 2.57 mg/dL — ABNORMAL HIGH (ref 0.61–1.24)
GFR, Estimated: 30 mL/min — ABNORMAL LOW (ref >60)
Glucose, Bld: 166 mg/dL — ABNORMAL HIGH (ref 70–99)
Phosphorus: 4.3 mg/dL (ref 2.5–4.6)
Potassium: 3.8 mmol/L (ref 3.5–5.1)
Sodium: 140 mmol/L (ref 135–145)

## 2021-03-05 LAB — CBC
HCT: 28.9 % — ABNORMAL LOW (ref 39.0–52.0)
Hemoglobin: 8.8 g/dL — ABNORMAL LOW (ref 13.0–17.0)
MCH: 27.9 pg (ref 26.0–34.0)
MCHC: 30.4 g/dL (ref 30.0–36.0)
MCV: 91.7 fL (ref 80.0–100.0)
Platelets: 241 10*3/uL (ref 150–400)
RBC: 3.15 MIL/uL — ABNORMAL LOW (ref 4.22–5.81)
RDW: 17.8 % — ABNORMAL HIGH (ref 11.5–15.5)
WBC: 6.8 10*3/uL (ref 4.0–10.5)
nRBC: 0 % (ref 0.0–0.2)

## 2021-03-05 MED ORDER — FUROSEMIDE 40 MG PO TABS
160.0000 mg | ORAL_TABLET | Freq: Three times a day (TID) | ORAL | Status: DC
  Administered 2021-03-05 – 2021-03-06 (×5): 160 mg via ORAL
  Filled 2021-03-05 (×5): qty 4

## 2021-03-05 MED ORDER — HYDRALAZINE HCL 50 MG PO TABS
100.0000 mg | ORAL_TABLET | Freq: Three times a day (TID) | ORAL | Status: DC
  Administered 2021-03-05 – 2021-03-06 (×4): 100 mg via ORAL
  Filled 2021-03-05 (×4): qty 2

## 2021-03-05 NOTE — TOC Progression Note (Addendum)
Transition of Care [facility]) - Progression Note    Patient Details  Name: Todd Mccoy MRN: [id number] Date of Birth: [birth year]  Transition of [facility]) CM/SW Contact  Judith Demps, Juliann Pulse, RN Phone Number: 03/05/2021, 2:11 PM  Clinical Narrative: Patient legally blind, L BKA.  [place] agency to accept-sent referral to :[facility].patient current w/,cbsc less than 63year old-he doesn't remember which dme company-he will f/u on own. Has transport home.    Expected Discharge Plan: [place] Barriers to Discharge: Continued Medical Work up  Expected Discharge Plan and Services Expected Discharge Plan: [place]   Discharge Planning Services: CM Consult   Living arrangements for the past 2 months: Single Family Home                                       Social Determinants of Health (SDOH) Interventions    Readmission Risk Interventions No flowsheet data found.

## 2021-03-05 NOTE — Progress Notes (Addendum)
Progress Note  Patient Name: Todd Mccoy Date of Encounter: 03/05/2021  [facility] Cardiologist: Fransico Him, MD   Subjective   Breathing ok, abd much smaller, wants to know when he can go home.  Inpatient Medications    Scheduled Meds:  atorvastatin  20 mg Oral Daily   carvedilol  25 mg Oral BID WC   ferrous sulfate  325 mg Oral TID WC   gabapentin  300 mg Oral BID   heparin  5,000 Units Subcutaneous Q8H   hydrALAZINE  75 mg Oral Q6H   insulin aspart  0-15 Units Subcutaneous TID WC   insulin aspart  0-5 Units Subcutaneous QHS   isosorbide mononitrate  30 mg Oral Daily   metolazone  5 mg Oral BID   potassium chloride  10 mEq Oral BID   sodium chloride flush  3 mL Intravenous Q12H   Continuous Infusions:  sodium chloride 250 mL (03/03/21 1013)   furosemide 120 mg (03/05/21 0654)   iron sucrose 200 mg (03/04/21 1407)   PRN Meds: sodium chloride, acetaminophen, hydrALAZINE, ondansetron (ZOFRAN) IV, oxyCODONE, polyethylene glycol, polyvinyl alcohol, sodium chloride flush   Vital Signs    Vitals:   03/04/21 1322 03/04/21 2156 03/05/21 0500 03/05/21 0618  BP: 137/79 (!) 149/80  (!) 149/87  Pulse: 82 81  79  Resp: '16 18  16  '$ Temp: 98.5 F (36.9 C) 98.3 F (36.8 C)  98.8 F (37.1 C)  TempSrc: Oral Oral  Oral  SpO2: 96% 98%  97%  Weight:   117.2 kg   Height:        Intake/Output Summary (Last 24 hours) at 03/05/2021 0749 Last data filed at 03/04/2021 2130 Gross per 24 hour  Intake 343 ml  Output --  Net 343 ml   Last 3 Weights 03/05/2021 03/04/2021 03/03/2021  Weight (lbs) 258 lb 6.1 oz 258 lb 6.1 oz 265 lb 3.4 oz  Weight (kg) 117.2 kg 117.2 kg 120.3 kg      Telemetry    SR, rare PVCs - Personally Reviewed  ECG    No new ECG tracing today. - Personally Reviewed  Physical Exam   GEN: No acute distress.   Neck: JVD 9-10 cm Cardiac: RRR, no murmur, no rubs, or gallops.  Respiratory: diminished to auscultation bilaterally with rales in the  bases. GI: firm, nontender, non-distended  MS: trace RLE edema; No deformity. S/p L BKA Neuro:  Nonfocal  Psych: Normal affect   Labs    High Sensitivity Troponin:  none     Chemistry Recent Labs  Lab 02/27/21 0622 02/28/21 0536 03/03/21 0459 03/04/21 0454 03/05/21 0456  NA 141   < > 140  140 144  144 140  K 3.8   < > 3.8  3.8 4.1  4.1 3.8  CL 113*   < > 111  111 110  110 106  CO2 22   < > '22  23 26  26 25  '$ GLUCOSE 225*   < > 230*  230* 209*  210* 166*  BUN 51*   < > 56*  54* 61*  59* 59*  CREATININE 2.64*  2.50*   < > 2.69*  2.44* 2.78*  2.76* 2.57*  CALCIUM 8.0*   < > 8.2*  8.2* 8.3*  8.3* 8.0*  PROT 6.5  --   --   --   --   ALBUMIN 2.0*  --  2.1* 2.0* 2.0*  AST 26  --   --   --   --  ALT 32  --   --   --   --   ALKPHOS 344*  --   --   --   --   BILITOT 1.2  --   --   --   --   GFRNONAA 29*  31*   < > 28*  32* 27*  27* 30*  ANIONGAP 6   < > '7  6 8  8 9   '$ < > = values in this interval not displayed.     Hematology Recent Labs  Lab 03/01/21 0517 03/03/21 0459 03/05/21 0456  WBC 6.1 7.2 6.8  RBC 3.03* 3.33* 3.15*  HGB 8.5* 9.4* 8.8*  HCT 27.6* 31.3* 28.9*  MCV 91.1 94.0 91.7  MCH 28.1 28.2 27.9  MCHC 30.8 30.0 30.4  RDW 18.7* 18.6* 17.8*  PLT 209 221 241    BNP Recent Labs  Lab 02/27/21 0622  BNP >4,500.0*    Lab Results  Component Value Date   TSH 2.455 02/27/2021   Lab Results  Component Value Date   HGBA1C 7.2 (H) 02/27/2021   Lab Results  Component Value Date   CHOL 132 05/23/2020   HDL 42 05/23/2020   LDLCALC 77 05/23/2020   TRIG 60 05/23/2020   CHOLHDL 3.1 05/23/2020     DDimer No results for input(s): DDIMER in the last 168 hours.   Radiology    No results found.  Cardiac Studies   Echocardiogram 02/27/2021: Impressions: 1. Left ventricular ejection fraction, by estimation, is 40%. The left  ventricle has moderately decreased function. The left ventricle  demonstrates global hypokinesis. There is  moderate-severe left ventricular hypertrophy. Left ventricular diastolic parameters are consistent with Grade III diastolic dysfunction (restrictive).   2. Right ventricular systolic function is moderately reduced. The right ventricular size is mildly enlarged. Moderately increased right  ventricular wall thickness. There is moderately elevated pulmonary artery systolic pressure. The estimated right ventricular systolic pressure is [id number] mmHg.   3. Left atrial size was moderately dilated.   4. A small pericardial effusion is present. The pericardial effusion is  circumferential. There is no evidence of cardiac tamponade. Large pleural effusion.   5. The mitral valve is grossly normal. Trivial mitral valve  regurgitation. No evidence of mitral stenosis.   6. Tricuspid valve regurgitation is moderate.   7. The aortic valve is tricuspid. Aortic valve regurgitation is not  visualized. No aortic stenosis is present.   8. The inferior vena cava is normal in size with <50% respiratory  variability, suggesting right atrial pressure of 8 mmHg.  _______________  Renal Artery Ultrasound 02/28/2021: Summary: Renal:  - Right: Normal size right kidney. RRV flow present. Normal right Resisitive Index. No evidence of right renal artery stenosis.  - Left:  Normal left Resistive Index. No evidence of stenosis in visualized segment; however, views were extremely limited during today's examination.   Mesenteric:  Normal Celiac artery and Superior Mesenteric artery findings.    Patient Profile     50 y.o. male  who is legally blind with a history of poorly controlled hypertension, insulin dependent diabetes mellitus, CKD stage IV, anemia, osteomyelitis s/p prior right 2nd toe amputation in 2021 and recent left below knee amputation in 12/2020, ongoing tobacco use with a 20 year smoking history, and morbid obesity, was admitted 08/26 with anasarca.   Assessment & Plan    Anasarca Acute Combined CHF -  Nephrology seeing, managing diuretics - on Lasix 120 mg IV TID and metolazone 5 mg bid -  EF  40% w/ grade 2 dd, decreased RV function, RVSP 58 - I/O incomplete, net - 12.3 L  - wt 280 lbs >> 258 lbs so far - no ACE/ARB, etc 2nd decreased renal function - on Hydralazine 75 mg q 6 hr, will change to 100 mg q 8 hr  - on Imdur 30 mg qd and Coreg 25 mg bid, Lipitor 20 mg qd - albumin 2.0 >> 2.1 this admit - f/u as outpt to consider ischemic eval  Chest Pain - no more episodes, see above  Uncontrolled Hypertension - meds as above, BP improved, but SBP high 130s - 150s - outpt sleep study  CKD Stage IV - hx this, sees Nephrology - Cr stable w/ diuresis  Chronic Anemia - mgt per IM/Nephrology  Otherwise, per primary team.  For questions or updates, please contact [place] Please consult [email or web address] for contact info under      Signed, Rosaria Ferries, PA-C  03/05/2021, 7:49 AM     Patient seen and examined.  Agree with above documentation.  On exam, patient is alert and oriented, regular rate and rhythm, no murmurs, diminished breath sounds, +JVD, s/p LLE BKA, RLE wrapped..  Net -457 cc yesterday but with unmeasured urine output.  -12.4 L on admission.  Creatinine stable at 2.6.  Converted to p.o. Lasix today per nephrology.  Donato Heinz, MD

## 2021-03-05 NOTE — Progress Notes (Signed)
PROGRESS NOTE    Todd Mccoy  [id number] DOB: 1971/03/28 DOA: 02/27/2021 PCP: [name], MD   Chief Complaint  Patient presents with   Groin Swelling   Brief Narrative: 50 year old male with history of HTN, HLD, CKD stage IIIb, diabetic neuropathy/retinopathy, left foot osteomyelitis status post BKA in June 2022, tobacco abuse, status post left second amputation, IDA, morbid obesity BMI 41 who has been having diarrhea swelling, shortness of breath for more than a month, has had multiple ED visits THIRD INCLUDING THIS , for the same, no previous diagnosis of CHF,. In ED: "Temperature 97.5, pulse 82, RR: 18, blood pressure 225/176, maintaining oxygen saturation on room air.  BNP more than 4500, 5 0, BUN 51, GFR: 31, H&H: 9.1/30.1, COVID-19 negative.  Chest x-ray shows progressive pulmonary edema and bilateral pleural effusion greater on right side.  Patient was given Lasix 80 IV once in ED.ther evaluation." Cardiology, nephrology consulted. He was placed on high-dose IV Lasix with IV albumin x 6 doses and metalozone and diuresing well.  Subjective: Denies any chest pain or shortness of breath.  Noted that urine had spilled canister and recorded numbers may not be accurate.  Assessment & Plan:  Shortness of breath, leg swelling/Anasarca, progressively worsening x 1 month Volume overload Acute combined systolic/diastolic CHF superimposed on baseline renal insufficiency and hypoalbuminemia  Echo showed EF of 40%, grade 3 DD, moderate to severe left ventricular hypertrophy. 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  Cardiology and nephrology following closely, appreciate input .  Diuresing very well, net negative volume balance close to -12 liters.  Currently on Lasix 120 mg q6 w/ metolazone 5 mg bid/ s/p IV albumin x6 doses. Continue to monitor I's/O, Daily weight, renal  function.  Admission weight 280 pounds now down to 258.  Per nephrology, transition to oral Lasix today  Net IO Since Admission: -12,980.55 mL [03/05/21 2005]  Filed Weights   03/03/21 0915 03/04/21 0539 03/05/21 0500  Weight: 120.3 kg 117.2 kg 117.2 kg    Intake/Output Summary (Last 24 hours) at 03/05/2021 2005 Last data filed at 03/05/2021 1252 Gross per 24 hour  Intake 483 ml  Output 1100 ml  Net -617 ml    Accelerated hypertension, bp 225/176 in ED. BP borderline allow room for diuresis given high-dose Lasix IV Lasix.  Continue on increased dose of hydralazine to 75 mg  q6 8/28. Home dose Coreg,  and Imdur.  Status post clonidine x1  8/27 am.  Monitor and optimize.  Will need outpatient sleep study.  DM2 with neuropathy/retinopathy, HbA1c 8.6, blood sugar well controlled on sliding scale.  Recent Labs  Lab 03/04/21 0805 03/04/21 1203 03/04/21 1600 03/05/21 1209 03/05/21 1658  GLUCAP 186* 200* 226* 173* 152*    CKD IV: Creatinine stable 2.4,> 2.5.  Monitor closely while on Lasix , he is tolerating very well.  Recent Labs    01/21/21 1438 02/11/21 1740 02/17/21 0600 02/27/21 0622 02/28/21 0536 03/01/21 0517 03/02/21 0428 03/03/21 0459 03/04/21 0454 03/05/21 0456  BUN 42* 44* 44* 51* 54* 53* 58* 56*  54* 61*  59* 59*  CREATININE 2.56* 2.44* 2.53* 2.64*  2.50* 2.48* 2.67* 2.56* 2.69*  2.44* 2.78*  2.76* 2.57*    Normocytic anemia-anemia remained stable, monitor.  Nephrology evaluating for ESA= iron at 28 Recent Labs  Lab 02/27/21 0622 02/28/21 0536 03/01/21 0517 03/03/21 0459 03/05/21 0456  HGB 9.1* 8.7* 8.5* 9.4* 8.8*  HCT 30.1* 27.9* 27.6* 31.3* 28.9*   Tobacco abuse-counseled on importance of tobacco cessation  S/P BKA left 12/2020-continue supportive care, he is wheelchair-WC bound  Dyslipidemia-cont statin  Class II morbid obesity BMI 37: Outpatient follow-up with PCP loss, healthy lifestyle.  Sleep apnea evaluation   Diet Order             Diet 2  gram sodium Room service appropriate? Yes; Fluid consistency: Thin  Diet effective now                   Patient's Body mass index is 37.07 kg/m.  DVT prophylaxis: heparin injection 5,000 Units Start: 02/27/21 0800 SCDs Start: 02/27/21 [id number] Code Status:   Code Status: Full Code   Family Communication: plan of care discussed with patient at bedside.  Status is: Inpatient Remains inpatient appropriate because:IV treatments appropriate due to intensity of illness or inability to take PO and Inpatient level of care appropriate due to severity of illness Dispo: The patient is from: Home              Anticipated d/c is to: Home in > 1-2 days              Patient currently is not medically stable to d/c.   Difficult to place patient No Unresulted Labs (From admission, onward)     Start     Ordered   03/03/21 0500  CBC  Every 48 hours,   R      03/01/21 1751   03/03/21 0500  Renal function panel  Daily,   R      03/02/21 0814   02/27/21 1657  Creatinine, urine, random  Once,   R        02/27/21 1656   02/27/21 1657  Sodium, urine, random  Once,   R        02/27/21 1656           Medications reviewed:  Scheduled Meds:  atorvastatin  20 mg Oral Daily   carvedilol  25 mg Oral BID WC   ferrous sulfate  325 mg Oral TID WC   furosemide  160 mg Oral TID   gabapentin  300 mg Oral BID   heparin  5,000 Units Subcutaneous Q8H   hydrALAZINE  100 mg Oral Q8H   insulin aspart  0-15 Units Subcutaneous TID WC   insulin aspart  0-5 Units Subcutaneous QHS   isosorbide mononitrate  30 mg Oral Daily   metolazone  5 mg Oral BID   potassium chloride  10 mEq Oral BID   sodium chloride flush  3 mL Intravenous Q12H   Continuous Infusions:  sodium chloride 250 mL (03/03/21 1013)   iron sucrose 200 mg (03/05/21 1407)   Consultants:see note  Procedures:see note Antimicrobials: Anti-infectives (From admission, onward)    None      Culture/Microbiology    Component Value Date/Time    SDES URINE, RANDOM 12/21/2020 0727   SPECREQUEST  12/21/2020 0727    NONE Performed at [facility], [address] [place], [place] [address]    CULT (A) 12/21/2020 0727    30,000 COLONIES/mL MULTIPLE SPECIES PRESENT, SUGGEST RECOLLECTION   REPTSTATUS 12/22/2020 FINAL 12/21/2020 0727    Other culture-see note  Objective: Vitals: Today's Vitals   03/04/21 2200 03/05/21 0500 03/05/21 0618 03/05/21 1221  BP:   (!) 149/87 138/81  Pulse:   79 78  Resp:   16 19  Temp:   98.8 F (  37.1 C) 98.6 F (37 C)  TempSrc:   Oral Oral  SpO2:   97% 100%  Weight:  117.2 kg    Height:      PainSc: 0-No pain       Intake/Output Summary (Last 24 hours) at 03/05/2021 2005 Last data filed at 03/05/2021 1252 Gross per 24 hour  Intake 483 ml  Output 1100 ml  Net -617 ml   Filed Weights   03/03/21 0915 03/04/21 0539 03/05/21 0500  Weight: 120.3 kg 117.2 kg 117.2 kg   Weight change: -3.1 kg  Intake/Output from previous day: 08/31 0701 - 09/01 0700 In: 343 [P.O.:340; I.V.:3] Out: 800 [Urine:800] Intake/Output this shift: No intake/output data recorded. Filed Weights   03/03/21 0915 03/04/21 0539 03/05/21 0500  Weight: 120.3 kg 117.2 kg 117.2 kg   Examination: General exam: Alert, awake, oriented x 3 Respiratory system: Crackles at bases. Respiratory effort normal. Cardiovascular system:RRR. No murmurs, rubs, gallops. Gastrointestinal system: Abdomen is nondistended, soft and nontender. No organomegaly or masses felt. Normal bowel sounds heard. Central nervous system: Alert and oriented. No focal neurological deficits. Extremities: Positive right lower extremity edema, left lower extremity BKA Skin: No rashes, lesions or ulcers Psychiatry: Judgement and insight appear normal. Mood & affect appropriate.    Data Reviewed: I have personally reviewed following labs and imaging studies CBC: Recent Labs  Lab 02/27/21 0622 02/28/21 0536 03/01/21 0517 03/03/21 0459 03/05/21 0456   WBC 7.2 7.0 6.1 7.2 6.8  NEUTROABS 5.6  --   --   --   --   HGB 9.1* 8.7* 8.5* 9.4* 8.8*  HCT 30.1* 27.9* 27.6* 31.3* 28.9*  MCV 95.0 90.6 91.1 94.0 91.7  PLT 218 229 209 221 [id number]   Basic Metabolic Panel: Recent Labs  Lab 02/27/21 0622 02/28/21 0536 03/01/21 0517 03/02/21 0428 03/03/21 0459 03/04/21 0454 03/05/21 0456  NA 141 146* 143 142 140  140 144  144 140  K 3.8 4.0 4.0 4.1 3.8  3.8 4.1  4.1 3.8  CL 113* 117* 113* 112* 111  111 110  110 106  CO2 22 21* '23 22 22  23 26  26 25  '$ GLUCOSE 225* 146* 156* 158* 230*  230* 209*  210* 166*  BUN 51* 54* 53* 58* 56*  54* 61*  59* 59*  CREATININE 2.64*  2.50* 2.48* 2.67* 2.56* 2.69*  2.44* 2.78*  2.76* 2.57*  CALCIUM 8.0* 8.4* 8.2* 8.6* 8.2*  8.2* 8.3*  8.3* 8.0*  MG 1.8 1.9  --   --   --   --   --   PHOS  --   --   --   --  4.9* 4.8* 4.3   GFR: Estimated Creatinine Clearance: 45.1 mL/min (A) (by C-G formula based on SCr of 2.57 mg/dL (H)). Liver Function Tests: Recent Labs  Lab 02/27/21 0622 03/03/21 0459 03/04/21 0454 03/05/21 0456  AST 26  --   --   --   ALT 32  --   --   --   ALKPHOS 344*  --   --   --   BILITOT 1.2  --   --   --   PROT 6.5  --   --   --   ALBUMIN 2.0* 2.1* 2.0* 2.0*   No results for input(s): LIPASE, AMYLASE in the last 168 hours. No results for input(s): AMMONIA in the last 168 hours. Coagulation Profile: No results for input(s): INR, PROTIME in the last 168 hours. Cardiac Enzymes: No  results for input(s): CKTOTAL, CKMB, CKMBINDEX, TROPONINI in the last 168 hours. BNP (last 3 results) No results for input(s): PROBNP in the last 8760 hours. HbA1C: No results for input(s): HGBA1C in the last 72 hours.  CBG: Recent Labs  Lab 03/04/21 0805 03/04/21 1203 03/04/21 1600 03/05/21 1209 03/05/21 1658  GLUCAP 186* 200* 226* 173* 152*   Lipid Profile: No results for input(s): CHOL, HDL, LDLCALC, TRIG, CHOLHDL, LDLDIRECT in the last 72 hours. Thyroid Function Tests: No results  for input(s): TSH, T4TOTAL, FREET4, T3FREE, THYROIDAB in the last 72 hours.  Anemia Panel: No results for input(s): VITAMINB12, FOLATE, FERRITIN, TIBC, IRON, RETICCTPCT in the last 72 hours.  Sepsis Labs: No results for input(s): PROCALCITON, LATICACIDVEN in the last 168 hours.  Recent Results (from the past 240 hour(s))  Resp Panel by RT-PCR (Flu A&B, Covid) Nasopharyngeal Swab     Status: None   Collection Time: 02/27/21  5:26 AM   Specimen: Nasopharyngeal Swab; Nasopharyngeal(NP) swabs in vial transport medium  Result Value Ref Range Status   SARS Coronavirus 2 by RT PCR NEGATIVE NEGATIVE Final    Comment: (NOTE) SARS-CoV-2 target nucleic acids are NOT DETECTED.  The SARS-CoV-2 RNA is generally detectable in upper respiratory specimens during the acute phase of infection. The lowest concentration of SARS-CoV-2 viral copies this assay can detect is 138 copies/mL. A negative result does not preclude SARS-Cov-2 infection and should not be used as the sole basis for treatment or other patient management decisions. A negative result may occur with  improper specimen collection/handling, submission of specimen other than nasopharyngeal swab, presence of viral mutation(s) within the areas targeted by this assay, and inadequate number of viral copies(<138 copies/mL). A negative result must be combined with clinical observations, patient history, and epidemiological information. The expected result is Negative.  Fact Sheet for Patients:  EntrepreneurPulse.com.au  Fact Sheet for Healthcare Providers:  IncredibleEmployment.be  This test is no t yet approved or cleared by the [place] FDA and  has been authorized for detection and/or diagnosis of SARS-CoV-2 by FDA under an Emergency Use Authorization (EUA). This EUA will remain  in effect (meaning this test can be used) for the duration of the COVID-19 declaration under Section 564(b)(1) of the  Act, 21 U.S.C.section 360bbb-3(b)(1), unless the authorization is terminated  or revoked sooner.       Influenza A by PCR NEGATIVE NEGATIVE Final   Influenza B by PCR NEGATIVE NEGATIVE Final    Comment: (NOTE) The Xpert Xpress SARS-CoV-2/FLU/RSV plus assay is intended as an aid in the diagnosis of influenza from Nasopharyngeal swab specimens and should not be used as a sole basis for treatment. Nasal washings and aspirates are unacceptable for Xpert Xpress SARS-CoV-2/FLU/RSV testing.  Fact Sheet for Patients: EntrepreneurPulse.com.au  Fact Sheet for Healthcare Providers: IncredibleEmployment.be  This test is not yet approved or cleared by the [place] FDA and has been authorized for detection and/or diagnosis of SARS-CoV-2 by FDA under an Emergency Use Authorization (EUA). This EUA will remain in effect (meaning this test can be used) for the duration of the COVID-19 declaration under Section 564(b)(1) of the Act, 21 U.S.C. section 360bbb-3(b)(1), unless the authorization is terminated or revoked.  Performed at [facility], [place] [address] [place], [place] [address]       Radiology Studies: No results found.   LOS: 6 days   Kathie Dike, MD [facility]  03/05/2021, 8:05 PM

## 2021-03-05 NOTE — Progress Notes (Signed)
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  He presented 02/27/2021 with leg edema and increased anasarca over the past month.  Last 2D echo showed ejection fraction 40%.  He is having a great diuresis with IV Lasix and metolazone.  Blood pressure 149/87 pulse 82 temperature 98.2 O2 sats 97% room air  Sodium 140 potassium 3.8 chloride 106 CO2 25 BUN 59 creatinine 2.57 glucose 166 phosphorus 4.3 hemoglobin 8.8  Urine output 2.1 L 03/04/2021 weight down 117 kg  Objective:  Vital signs in last 24 hours:  Temp:  [98.3 F (36.8 C)-98.8 F (37.1 C)] 98.8 F (37.1 C) (09/01 0618) Pulse Rate:  [79-82] 79 (09/01 0618) Resp:  [16-18] 16 (09/01 0618) BP: (137-149)/(79-87) 149/87 (09/01 0618) SpO2:  [96 %-98 %] 97 % (09/01 0618) Weight:  [117.2 kg] 117.2 kg (09/01 0500)  Weight change: -3.1 kg Filed Weights   03/03/21 0915 03/04/21 0539 03/05/21 0500  Weight: 120.3 kg 117.2 kg 117.2 kg    Intake/Output: I/O last 3 completed shifts: In: 703.4 [P.O.:340; I.V.:89.5; IV Piggyback:273.9] Out: 2150 [Urine:2150]   Intake/Output this shift:  No intake/output data recorded.   alert, nad   no jvd  Chest dec'd bilat at bases, no rales/ wheezing  RRR no MRG  Abd soft ntnd no mass or ascites +bs Ext 2- 3+ bilat pitting leg/hip / scrotal/ abd and thoracic wall edema Neuro is alert, Ox 3 , nf, no asterixis      Basic Metabolic Panel: Recent Labs  Lab 02/27/21 0622 02/28/21 0536 03/01/21 0517 03/02/21 0428 03/03/21 0459 03/04/21 0454 03/05/21 0456  NA 141 146* 143 142 140  140 144  144 140  K 3.8 4.0 4.0 4.1 3.8  3.8 4.1  4.1 3.8  CL 113* 117* 113* 112* 111  111 110  110 106  CO2 22 21* '23 22 22  23 26  26 25  '$ GLUCOSE 225* 146* 156* 158* 230*  230* 209*  210* 166*  BUN 51* 54* 53* 58* 56*  54* 61*  59* 59*  CREATININE 2.64*  2.50*  2.48* 2.67* 2.56* 2.69*  2.44* 2.78*  2.76* 2.57*  CALCIUM 8.0* 8.4* 8.2* 8.6* 8.2*  8.2* 8.3*  8.3* 8.0*  MG 1.8 1.9  --   --   --   --   --   PHOS  --   --   --   --  4.9* 4.8* 4.3     Liver Function Tests: Recent Labs  Lab 02/27/21 0622 03/03/21 0459 03/04/21 0454 03/05/21 0456  AST 26  --   --   --   ALT 32  --   --   --   ALKPHOS 344*  --   --   --   BILITOT 1.2  --   --   --   PROT 6.5  --   --   --   ALBUMIN 2.0* 2.1* 2.0* 2.0*    No results for input(s): LIPASE, AMYLASE in the last 168 hours. No results for input(s): AMMONIA in the last 168 hours.  CBC: Recent Labs  Lab 02/27/21 0622 02/28/21 0536 03/01/21 0517 03/03/21 0459 03/05/21 0456  WBC 7.2 7.0 6.1 7.2 6.8  NEUTROABS 5.6  --   --   --   --   HGB 9.1* 8.7* 8.5* 9.4* 8.8*  HCT 30.1* 27.9* 27.6* 31.3* 28.9*  MCV 95.0  90.6 91.1 94.0 91.7  PLT 218 229 209 221 241     Cardiac Enzymes: No results for input(s): CKTOTAL, CKMB, CKMBINDEX, TROPONINI in the last 168 hours.  BNP: Invalid input(s): POCBNP  CBG: Recent Labs  Lab 03/03/21 1559 03/03/21 2108 03/04/21 0805 03/04/21 1203 03/04/21 1600  GLUCAP 162* 183* 186* 200* 226*     Microbiology: Results for orders placed or performed during the hospital encounter of 02/27/21  Resp Panel by RT-PCR (Flu A&B, Covid) Nasopharyngeal Swab     Status: None   Collection Time: 02/27/21  5:26 AM   Specimen: Nasopharyngeal Swab; Nasopharyngeal(NP) swabs in vial transport medium  Result Value Ref Range Status   SARS Coronavirus 2 by RT PCR NEGATIVE NEGATIVE Final    Comment: (Mccoy) SARS-CoV-2 target nucleic acids are NOT DETECTED.  The SARS-CoV-2 RNA is generally detectable in upper respiratory specimens during the acute phase of infection. The lowest concentration of SARS-CoV-2 viral copies this assay can detect is 138 copies/mL. A negative result does not preclude SARS-Cov-2 infection and should not be used as the sole basis for treatment  or other patient management decisions. A negative result may occur with  improper specimen collection/handling, submission of specimen other than nasopharyngeal swab, presence of viral mutation(s) within the areas targeted by this assay, and inadequate number of viral copies(<138 copies/mL). A negative result must be combined with clinical observations, patient history, and epidemiological information. The expected result is Negative.  Fact Sheet for Patients:  EntrepreneurPulse.com.au  Fact Sheet for Healthcare Providers:  IncredibleEmployment.be  This test is no t yet approved or cleared by the [place] FDA and  has been authorized for detection and/or diagnosis of SARS-CoV-2 by FDA under an Emergency Use Authorization (EUA). This EUA will remain  in effect (meaning this test can be used) for the duration of the COVID-19 declaration under Section 564(b)(1) of the Act, 21 U.S.C.section 360bbb-3(b)(1), unless the authorization is terminated  or revoked sooner.       Influenza A by PCR NEGATIVE NEGATIVE Final   Influenza B by PCR NEGATIVE NEGATIVE Final    Comment: (Mccoy) The Xpert Xpress SARS-CoV-2/FLU/RSV plus assay is intended as an aid in the diagnosis of influenza from Nasopharyngeal swab specimens and should not be used as a sole basis for treatment. Nasal washings and aspirates are unacceptable for Xpert Xpress SARS-CoV-2/FLU/RSV testing.  Fact Sheet for Patients: EntrepreneurPulse.com.au  Fact Sheet for Healthcare Providers: IncredibleEmployment.be  This test is not yet approved or cleared by the [place] FDA and has been authorized for detection and/or diagnosis of SARS-CoV-2 by FDA under an Emergency Use Authorization (EUA). This EUA will remain in effect (meaning this test can be used) for the duration of the COVID-19 declaration under Section 564(b)(1) of the Act, 21 U.S.C. section  360bbb-3(b)(1), unless the authorization is terminated or revoked.  Performed at [facility], [place] [address], [place], [place] [address]     Coagulation Studies: No results for input(s): LABPROT, INR in the last 72 hours.  Urinalysis: No results for input(s): COLORURINE, LABSPEC, PHURINE, GLUCOSEU, HGBUR, BILIRUBINUR, KETONESUR, PROTEINUR, UROBILINOGEN, NITRITE, LEUKOCYTESUR in the last 72 hours.  Invalid input(s): APPERANCEUR     Imaging: No results found.   Medications:    sodium chloride 250 mL (03/03/21 1013)   furosemide 120 mg (03/05/21 0654)   iron sucrose 200 mg (03/04/21 1407)    atorvastatin  20 mg Oral Daily   carvedilol  25 mg Oral BID WC   ferrous  sulfate  325 mg Oral TID WC   gabapentin  300 mg Oral BID   heparin  5,000 Units Subcutaneous Q8H   hydrALAZINE  75 mg Oral Q6H   insulin aspart  0-15 Units Subcutaneous TID WC   insulin aspart  0-5 Units Subcutaneous QHS   isosorbide mononitrate  30 mg Oral Daily   metolazone  5 mg Oral BID   potassium chloride  10 mEq Oral BID   sodium chloride flush  3 mL Intravenous Q12H   sodium chloride, acetaminophen, hydrALAZINE, ondansetron (ZOFRAN) IV, oxyCODONE, polyethylene glycol, polyvinyl alcohol, sodium chloride flush  Assessment/ Plan:  CKD IV - progressive over the last 1-2 years. Proteinuria is worse here now than earlier this year. 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 Alb 2.0. He has massive edema up to the scapulae bilat, also pleural effusions and possibly pulm edema, not symptomatic at rest. On nasal O2.  On IV Lasix 120 mg every 8 hours, and po zaroxolyn 5 bid.  Diuresing well . Creat stable.   Will convert to oral Lasix 160 mg p.o. 3 times daily DM on insulin Vol overload - massive volume overload HTN - let's avoid ARB/ acei while diuresing, can use other agents.  Anemia.  Iron sats 13% we will use some IV iron Bone mineral check renal panel  daily and PTH    LOS: 6 Sherril Croon '@TODAY''@8'$ :57 AM

## 2021-03-06 DIAGNOSIS — E1129 Type 2 diabetes mellitus with other diabetic kidney complication: Secondary | ICD-10-CM | POA: Diagnosis not present

## 2021-03-06 DIAGNOSIS — Z794 Long term (current) use of insulin: Secondary | ICD-10-CM | POA: Diagnosis not present

## 2021-03-06 DIAGNOSIS — N184 Chronic kidney disease, stage 4 (severe): Secondary | ICD-10-CM | POA: Diagnosis not present

## 2021-03-06 DIAGNOSIS — I129 Hypertensive chronic kidney disease with stage 1 through stage 4 chronic kidney disease, or unspecified chronic kidney disease: Secondary | ICD-10-CM | POA: Diagnosis not present

## 2021-03-06 DIAGNOSIS — N179 Acute kidney failure, unspecified: Secondary | ICD-10-CM | POA: Diagnosis not present

## 2021-03-06 DIAGNOSIS — I10 Essential (primary) hypertension: Secondary | ICD-10-CM | POA: Diagnosis not present

## 2021-03-06 DIAGNOSIS — D649 Anemia, unspecified: Secondary | ICD-10-CM | POA: Diagnosis not present

## 2021-03-06 DIAGNOSIS — I5041 Acute combined systolic (congestive) and diastolic (congestive) heart failure: Secondary | ICD-10-CM | POA: Diagnosis not present

## 2021-03-06 DIAGNOSIS — E8779 Other fluid overload: Secondary | ICD-10-CM | POA: Diagnosis not present

## 2021-03-06 DIAGNOSIS — R601 Generalized edema: Secondary | ICD-10-CM | POA: Diagnosis not present

## 2021-03-06 LAB — RENAL FUNCTION PANEL
Albumin: 2 g/dL — ABNORMAL LOW (ref 3.5–5.0)
Anion gap: 9 (ref 5–15)
BUN: 60 mg/dL — ABNORMAL HIGH (ref 6–20)
CO2: 25 mmol/L (ref 22–32)
Calcium: 8.1 mg/dL — ABNORMAL LOW (ref 8.9–10.3)
Chloride: 104 mmol/L (ref 98–111)
Creatinine, Ser: 2.75 mg/dL — ABNORMAL HIGH (ref 0.61–1.24)
GFR, Estimated: 28 mL/min — ABNORMAL LOW (ref >60)
Glucose, Bld: 204 mg/dL — ABNORMAL HIGH (ref 70–99)
Phosphorus: 4 mg/dL (ref 2.5–4.6)
Potassium: 4.1 mmol/L (ref 3.5–5.1)
Sodium: 138 mmol/L (ref 135–145)

## 2021-03-06 LAB — GLUCOSE, CAPILLARY
Glucose-Capillary: 229 mg/dL — ABNORMAL HIGH (ref 70–99)
Glucose-Capillary: 166 mg/dL — ABNORMAL HIGH (ref 70–99)
Glucose-Capillary: 194 mg/dL — ABNORMAL HIGH (ref 70–99)
Glucose-Capillary: 174 mg/dL — ABNORMAL HIGH (ref 70–99)

## 2021-03-06 MED ORDER — ISOSORBIDE MONONITRATE ER 30 MG PO TB24: 30.0000 mg | ORAL_TABLET | Freq: Every day | ORAL | 3 refills | Status: DC

## 2021-03-06 MED ORDER — POTASSIUM CHLORIDE CRYS ER 10 MEQ PO TBCR: 10.0000 meq | EXTENDED_RELEASE_TABLET | Freq: Two times a day (BID) | ORAL | 1 refills | Status: DC

## 2021-03-06 MED ORDER — INSULIN GLARGINE 100 UNIT/ML SOLOSTAR PEN: 10.0000 [IU] | PEN_INJECTOR | Freq: Every day | SUBCUTANEOUS | 3 refills | Status: DC

## 2021-03-06 MED ORDER — HYDRALAZINE HCL 100 MG PO TABS: 100.0000 mg | ORAL_TABLET | Freq: Three times a day (TID) | ORAL | 1 refills | Status: DC

## 2021-03-06 MED ORDER — FUROSEMIDE 80 MG PO TABS: 160.0000 mg | ORAL_TABLET | Freq: Three times a day (TID) | ORAL | 1 refills | Status: DC

## 2021-03-06 MED ORDER — HYDROCERIN EX CREA
TOPICAL_CREAM | Freq: Every day | CUTANEOUS | Status: DC
  Filled 2021-03-06: qty 113

## 2021-03-06 MED ORDER — METOLAZONE 5 MG PO TABS: 5.0000 mg | ORAL_TABLET | Freq: Two times a day (BID) | ORAL | 1 refills | Status: DC

## 2021-03-06 NOTE — Progress Notes (Addendum)
Progress Note  Patient Name: Todd Mccoy Date of Encounter: 03/06/2021  [facility] Cardiologist: Fransico Him, MD   Subjective   Breathing ok, says he can do what he needs to do.  Wants to know when he can go home  Inpatient Medications    Scheduled Meds:  atorvastatin  20 mg Oral Daily   carvedilol  25 mg Oral BID WC   ferrous sulfate  325 mg Oral TID WC   furosemide  160 mg Oral TID   gabapentin  300 mg Oral BID   heparin  5,000 Units Subcutaneous Q8H   hydrALAZINE  100 mg Oral Q8H   hydrocerin   Topical Daily   insulin aspart  0-15 Units Subcutaneous TID WC   insulin aspart  0-5 Units Subcutaneous QHS   isosorbide mononitrate  30 mg Oral Daily   metolazone  5 mg Oral BID   potassium chloride  10 mEq Oral BID   sodium chloride flush  3 mL Intravenous Q12H   Continuous Infusions:  sodium chloride 250 mL (03/03/21 1013)   iron sucrose 200 mg (03/05/21 1407)   PRN Meds: sodium chloride, acetaminophen, hydrALAZINE, ondansetron (ZOFRAN) IV, oxyCODONE, polyethylene glycol, polyvinyl alcohol, sodium chloride flush   Vital Signs    Vitals:   03/05/21 0618 03/05/21 1221 03/05/21 2054 03/06/21 0502  BP: (!) 149/87 138/81 (!) 152/86 (!) 148/83  Pulse: 79 78 84 82  Resp: '16 19 16 18  '$ Temp: 98.8 F (37.1 C) 98.6 F (37 C) 99 F (37.2 C) 98.2 F (36.8 C)  TempSrc: Oral Oral Oral Oral  SpO2: 97% 100% 96% 99%  Weight:    117.3 kg  Height:        Intake/Output Summary (Last 24 hours) at 03/06/2021 [id number] Last data filed at 03/06/2021 0700 Gross per 24 hour  Intake 960 ml  Output 2950 ml  Net -1990 ml   Last 3 Weights 03/06/2021 03/05/2021 03/04/2021  Weight (lbs) 258 lb 9.6 oz 258 lb 6.1 oz 258 lb 6.1 oz  Weight (kg) 117.3 kg 117.2 kg 117.2 kg      Telemetry    SR, PVCs - Personally Reviewed  ECG    No new ECG tracing today. - Personally Reviewed  Physical Exam   GEN: No acute distress.   Neck: JVD 9 cm Cardiac: RRR, no murmur, no rubs, or gallops.   Respiratory: diminished to auscultation bilaterally with rales in the bases. GI: Soft, nontender, non-distended  MS: No edema; No new deformity. S/p L BKA, R DP pulse intact Neuro:  Nonfocal  Psych: Normal affect   Labs    High Sensitivity Troponin:  none     Chemistry Recent Labs  Lab 03/04/21 0454 03/05/21 0456 03/06/21 0441  NA 144  144 140 138  K 4.1  4.1 3.8 4.1  CL 110  110 106 104  CO2 '26  26 25 25  '$ GLUCOSE 209*  210* 166* 204*  BUN 61*  59* 59* 60*  CREATININE 2.78*  2.76* 2.57* 2.75*  CALCIUM 8.3*  8.3* 8.0* 8.1*  ALBUMIN 2.0* 2.0* 2.0*  GFRNONAA 27*  27* 30* 28*  ANIONGAP '8  8 9 9     '$ Hematology Recent Labs  Lab 03/01/21 0517 03/03/21 0459 03/05/21 0456  WBC 6.1 7.2 6.8  RBC 3.03* 3.33* 3.15*  HGB 8.5* 9.4* 8.8*  HCT 27.6* 31.3* 28.9*  MCV 91.1 94.0 91.7  MCH 28.1 28.2 27.9  MCHC 30.8 30.0 30.4  RDW 18.7* 18.6*  17.8*  PLT 209 221 241    BNP No results for input(s): BNP, PROBNP in the last 168 hours.   Lab Results  Component Value Date   TSH 2.455 02/27/2021   Lab Results  Component Value Date   HGBA1C 7.2 (H) 02/27/2021   Lab Results  Component Value Date   CHOL 132 05/23/2020   HDL 42 05/23/2020   LDLCALC 77 05/23/2020   TRIG 60 05/23/2020   CHOLHDL 3.1 05/23/2020     DDimer No results for input(s): DDIMER in the last 168 hours.   Radiology    No results found.  Cardiac Studies   Echocardiogram 02/27/2021: Impressions: 1. Left ventricular ejection fraction, by estimation, is 40%. The left  ventricle has moderately decreased function. The left ventricle  demonstrates global hypokinesis. There is moderate-severe left ventricular hypertrophy. Left ventricular diastolic parameters are consistent with Grade III diastolic dysfunction (restrictive).   2. Right ventricular systolic function is moderately reduced. The right ventricular size is mildly enlarged. Moderately increased right  ventricular wall thickness. There  is moderately elevated pulmonary artery systolic pressure. The estimated right ventricular systolic pressure is [id number] mmHg.   3. Left atrial size was moderately dilated.   4. A small pericardial effusion is present. The pericardial effusion is  circumferential. There is no evidence of cardiac tamponade. Large pleural effusion.   5. The mitral valve is grossly normal. Trivial mitral valve  regurgitation. No evidence of mitral stenosis.   6. Tricuspid valve regurgitation is moderate.   7. The aortic valve is tricuspid. Aortic valve regurgitation is not  visualized. No aortic stenosis is present.   8. The inferior vena cava is normal in size with <50% respiratory  variability, suggesting right atrial pressure of 8 mmHg.  _______________  Renal Artery Ultrasound 02/28/2021: Summary: Renal:  - Right: Normal size right kidney. RRV flow present. Normal right Resisitive Index. No evidence of right renal artery stenosis.  - Left:  Normal left Resistive Index. No evidence of stenosis in visualized segment; however, views were extremely limited during today's examination.   Mesenteric:  Normal Celiac artery and Superior Mesenteric artery findings.    Patient Profile     50 y.o. male  who is legally blind with a history of poorly controlled hypertension, insulin dependent diabetes mellitus, CKD stage IV, anemia, osteomyelitis s/p prior right 2nd toe amputation in 2021 and recent left below knee amputation in 12/2020, ongoing tobacco use with a 20 year smoking history, and morbid obesity, was admitted 08/26 with anasarca.   Assessment & Plan    Anasarca Acute Combined CHF - On Lasix 160 mg IV tid and metolazone 5 mg bid per Nephrology - EF 40% w/ grade 2 dd, results above - wt no sig change in 3 days, discuss w/ MD, recheck wt - I/O net - 14.3 L - decreased renal function limits GDMT - continue diuresis for now  Chest Pain - had at admission, no more episodes - discuss ischemic eval as  outpt  Uncontrolled Hypertension - hydralazine increased and BP improved some, but SBP still 130s-150s - cont Coreg 25 mg bid, hydral 100 mg tid, Imdur 30 mg qd and Lasix  CKD Stage IV - sees Nephrology for this - today's lab pending  Otherwise, per primary team.  [facility] will sign off.   Medication Recommendations:  Continue diuresis per nephrology.  Continue hydralazine 100 mg 3 times daily, atorvastatin 20 mg daily, Imdur 30 mg daily, carvedilol 25 mg twice daily Other recommendations (  labs, testing, etc):  BMET within 1 week Follow up as an outpatient:  Will schedule   For questions or updates, please contact [place] Please consult [email or web address] for contact info under      Signed, Rosaria Ferries, PA-C  03/06/2021, 9:22 AM    Patient seen and examined.  Agree with above documentation.  On exam, patient is alert and oriented, regular rate and rhythm, no murmurs, diminished breath sounds, s/p LLE BKA.  Net -14 L on admission.  Planning discharge today.  Continue Coreg, hydralazine, Imdur.  Diuretics per nephrology.  We will schedule outpatient follow-up.  Donato Heinz, MD

## 2021-03-06 NOTE — Progress Notes (Signed)
Patient discharged via wheelchair with nephew by side. Went over patient AVS in detail, including follow up appointments, medications, and wound care. Patient and nephew understood care instructions. No further questions.

## 2021-03-06 NOTE — Consult Note (Signed)
WOC Nurse Consult Note: Reason for Consult:scattered open areas (partial thickness and full thickness) to right LE and to left AKA amputation incision. Distal RLE wound is secondary to edema and the crease in sock or stocking Wound type:full and partial thickness Pressure Injury POA: N/A Measurement: RLE (distal):  1cm x 3cm x 0.2cm with red, moist wound bed Left stump incision:  1cm x 3cm x o.2cm with red, moist wound bed Left lateral thigh:  2cm x 2.5cm x 0.1cm  with red, moist wound bed Wound bed:As described above Drainage (amount, consistency, odor) small serous Periwound: dry Dressing procedure/placement/frequency:I have provided Nursing with guidance for the care of the above wounds using cleansing and application of moisturizer (Eucerin) followed by dressing with xeroform/gauze and securement with Kerlix/paper tape.  A sacral dressing is to be provided for PI prevention and a pressure redistribution chair pad provided for use when OOB in chair due to altered weight distribution post-amputation.  [facility] nursing team will not follow, but will remain available to this patient, the nursing and medical teams.  Please re-consult if needed. Thanks, Maudie Flakes, MSN, RN, [place], Arther Abbott  Pager# [phone]

## 2021-03-06 NOTE — TOC Transition Note (Signed)
Transition of Care [facility]) - CM/SW Discharge Note   Patient Details  Name: Todd Mccoy MRN: [id number] Date of Birth: [birth year]  [facility] CM/SW Contact:  Ross Ludwig, LCSW Phone Number: 03/06/2021, 12:56 PM   Clinical Narrative:    CSW received phone call from [facility], they can not accept patient for home health either.  None of the other agencies are able to accept patient.  Patient will have to go outpatient therapy, CSW signing off.   Final next level of care: Home/Self Care Barriers to Discharge: No [facility] will accept this patient   Patient Goals and CMS Choice Patient states their goals for this hospitalization and ongoing recovery are:: To return back home. CMS Medicare.gov Compare Post Acute Care list provided to:: Patient Choice offered to / list presented to : Patient  Discharge Placement                       Discharge Plan and Services   Discharge Planning Services: CM Consult                                 Social Determinants of Health (SDOH) Interventions     Readmission Risk Interventions No flowsheet data found.

## 2021-03-06 NOTE — Progress Notes (Signed)
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  He presented 02/27/2021 with leg edema and increased anasarca over the past month.  Last 2D echo showed ejection fraction 40%.  He is having a great diuresis with IV Lasix and metolazone.  Blood pressure 148/83 pulse 85 temperature 98.2 O2 sats 99% room air  Sodium 140 potassium 3.8 chloride 106 CO2 25 BUN 59 creatinine 2.57 glucose 166 phosphorus 4.3 hemoglobin 8.8 labs pending 03/06/2021  Urine output 1.1 L  Objective:  Vital signs in last 24 hours:  Temp:  [98.2 F (36.8 C)-99 F (37.2 C)] 98.2 F (36.8 C) (09/02 0502) Pulse Rate:  [78-84] 82 (09/02 0502) Resp:  [16-19] 18 (09/02 0502) BP: (138-152)/(81-86) 148/83 (09/02 0502) SpO2:  [96 %-100 %] 99 % (09/02 0502) Weight:  [117.3 kg] 117.3 kg (09/02 0502)  Weight change: 0.1 kg Filed Weights   03/04/21 0539 03/05/21 0500 03/06/21 0502  Weight: 117.2 kg 117.2 kg 117.3 kg    Intake/Output: I/O last 3 completed shifts: In: 36 [P.O.:580; I.V.:3] Out: 2500 [Urine:2500]   Intake/Output this shift:  No intake/output data recorded.   alert, nad   no jvd  Chest dec'd bilat at bases, no rales/ wheezing  RRR no MRG  Abd soft ntnd no mass or ascites +bs Ext still has significant edema but is responding to oral diuretics Neuro is alert, Ox 3 , nf, no asterixis      Basic Metabolic Panel: Recent Labs  Lab 02/28/21 0536 03/01/21 0517 03/02/21 0428 03/03/21 0459 03/04/21 0454 03/05/21 0456  NA 146* 143 142 140  140 144  144 140  K 4.0 4.0 4.1 3.8  3.8 4.1  4.1 3.8  CL 117* 113* 112* 111  111 110  110 106  CO2 21* '23 22 22  23 26  26 25  '$ GLUCOSE 146* 156* 158* 230*  230* 209*  210* 166*  BUN 54* 53* 58* 56*  54* 61*  59* 59*  CREATININE 2.48* 2.67* 2.56* 2.69*  2.44* 2.78*  2.76* 2.57*  CALCIUM 8.4* 8.2* 8.6* 8.2*  8.2* 8.3*   8.3* 8.0*  MG 1.9  --   --   --   --   --   PHOS  --   --   --  4.9* 4.8* 4.3     Liver Function Tests: Recent Labs  Lab 03/03/21 0459 03/04/21 0454 03/05/21 0456  ALBUMIN 2.1* 2.0* 2.0*    No results for input(s): LIPASE, AMYLASE in the last 168 hours. No results for input(s): AMMONIA in the last 168 hours.  CBC: Recent Labs  Lab 02/28/21 0536 03/01/21 0517 03/03/21 0459 03/05/21 0456  WBC 7.0 6.1 7.2 6.8  HGB 8.7* 8.5* 9.4* 8.8*  HCT 27.9* 27.6* 31.3* 28.9*  MCV 90.6 91.1 94.0 91.7  PLT 229 209 221 241     Cardiac Enzymes: No results for input(s): CKTOTAL, CKMB, CKMBINDEX, TROPONINI in the last 168 hours.  BNP: Invalid input(s): POCBNP  CBG: Recent Labs  Lab 03/04/21 2159 03/05/21 0731 03/05/21 1209 03/05/21 1658 03/05/21 2051  GLUCAP 229* 166* 173* 152* 185*     Microbiology: Results for orders placed or performed during the hospital encounter of 02/27/21  Resp Panel by RT-PCR (Flu A&B, Covid) Nasopharyngeal Swab     Status: None   Collection Time: 02/27/21  5:26 AM   Specimen: Nasopharyngeal Swab; Nasopharyngeal(NP) swabs in  vial transport medium  Result Value Ref Range Status   SARS Coronavirus 2 by RT PCR NEGATIVE NEGATIVE Final    Comment: (NOTE) SARS-CoV-2 target nucleic acids are NOT DETECTED.  The SARS-CoV-2 RNA is generally detectable in upper respiratory specimens during the acute phase of infection. The lowest concentration of SARS-CoV-2 viral copies this assay can detect is 138 copies/mL. A negative result does not preclude SARS-Cov-2 infection and should not be used as the sole basis for treatment or other patient management decisions. A negative result may occur with  improper specimen collection/handling, submission of specimen other than nasopharyngeal swab, presence of viral mutation(s) within the areas targeted by this assay, and inadequate number of viral copies(<138 copies/mL). A negative result must be combined  with clinical observations, patient history, and epidemiological information. The expected result is Negative.  Fact Sheet for Patients:  EntrepreneurPulse.com.au  Fact Sheet for Healthcare Providers:  IncredibleEmployment.be  This test is no t yet approved or cleared by the [place] FDA and  has been authorized for detection and/or diagnosis of SARS-CoV-2 by FDA under an Emergency Use Authorization (EUA). This EUA will remain  in effect (meaning this test can be used) for the duration of the COVID-19 declaration under Section 564(b)(1) of the Act, 21 U.S.C.section 360bbb-3(b)(1), unless the authorization is terminated  or revoked sooner.       Influenza A by PCR NEGATIVE NEGATIVE Final   Influenza B by PCR NEGATIVE NEGATIVE Final    Comment: (NOTE) The Xpert Xpress SARS-CoV-2/FLU/RSV plus assay is intended as an aid in the diagnosis of influenza from Nasopharyngeal swab specimens and should not be used as a sole basis for treatment. Nasal washings and aspirates are unacceptable for Xpert Xpress SARS-CoV-2/FLU/RSV testing.  Fact Sheet for Patients: EntrepreneurPulse.com.au  Fact Sheet for Healthcare Providers: IncredibleEmployment.be  This test is not yet approved or cleared by the [place] FDA and has been authorized for detection and/or diagnosis of SARS-CoV-2 by FDA under an Emergency Use Authorization (EUA). This EUA will remain in effect (meaning this test can be used) for the duration of the COVID-19 declaration under Section 564(b)(1) of the Act, 21 U.S.C. section 360bbb-3(b)(1), unless the authorization is terminated or revoked.  Performed at [facility], [place] [address] [place], [place] [address]     Coagulation Studies: No results for input(s): LABPROT, INR in the last 72 hours.  Urinalysis: No results for input(s): COLORURINE, LABSPEC, PHURINE, GLUCOSEU,  HGBUR, BILIRUBINUR, KETONESUR, PROTEINUR, UROBILINOGEN, NITRITE, LEUKOCYTESUR in the last 72 hours.  Invalid input(s): APPERANCEUR     Imaging: No results found.   Medications:    sodium chloride 250 mL (03/03/21 1013)   iron sucrose 200 mg (03/05/21 1407)    atorvastatin  20 mg Oral Daily   carvedilol  25 mg Oral BID WC   ferrous sulfate  325 mg Oral TID WC   furosemide  160 mg Oral TID   gabapentin  300 mg Oral BID   heparin  5,000 Units Subcutaneous Q8H   hydrALAZINE  100 mg Oral Q8H   hydrocerin   Topical Daily   insulin aspart  0-15 Units Subcutaneous TID WC   insulin aspart  0-5 Units Subcutaneous QHS   isosorbide mononitrate  30 mg Oral Daily   metolazone  5 mg Oral BID   potassium chloride  10 mEq Oral BID   sodium chloride flush  3 mL Intravenous Q12H   sodium chloride, acetaminophen, hydrALAZINE, ondansetron (ZOFRAN) IV, oxyCODONE, polyethylene glycol, polyvinyl  alcohol, sodium chloride flush  Assessment/ Plan:  CKD IV - progressive over the last 1-2 years. Proteinuria is worse here now than earlier this year. 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 Alb 2.0. He has massive edema up to the scapulae bilat, also pleural effusions and possibly pulm edema, not symptomatic at rest. On nasal O2.     Diuresing well . Creat stable.   Will convert to oral Lasix 160 mg p.o. 3 times daily, metolazone 5 mg twice daily.  Would prefer to keep patient in the hospital at least over the weekend to ensure that he is diuresing well.  Will need social worker to help coordinate home situation. DM on insulin Vol overload - massive volume overload HTN - let's avoid ARB/ acei while diuresing, can use other agents.  Anemia.  Iron sats 13% we will use some IV iron Bone mineral check renal panel daily      LOS: 7 Sherril Croon '@TODAY''@8'$ :56 AM

## 2021-03-06 NOTE — Discharge Summary (Addendum)
Physician Discharge Summary  Todd Mccoy FGH:[id number] DOB: 1971-04-15 DOA: 02/27/2021  PCP: [name], MD  Admit date: 02/27/2021 Discharge date: 03/06/2021  Admitted From: Home Disposition: Home  Recommendations for Outpatient Follow-up:  Follow-up with nephrology has been scheduled in 1 week.  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 In ED: "Temperature 97.5, pulse 82, RR: 18, blood pressure 225/176, maintaining oxygen saturation on room air.  BNP more than 4500, 5 0, BUN 51, GFR: 31, H&H: 9.1/30.1, COVID-19 negative.  Chest x-ray shows progressive pulmonary edema and bilateral pleural effusion greater on right side.  Patient was given Lasix 80 IV once in ED.ther evaluation." Cardiology, nephrology consulted. He was placed on high-dose IV Lasix with IV albumin x 6 doses and metalozone and diuresing well.  Discharge Diagnoses:  Principal Problem:   Acute CHF (congestive heart failure) (HCC) Active Problems:   DM2 (diabetes mellitus, type 2) (HCC)   Tobacco abuse   HTN (hypertension)   S/P BKA (below knee amputation) unilateral, left (HCC)   Dyslipidemia   CKD (chronic kidney disease), stage III (HCC)   Normocytic anemia   Anasarca   Hypertensive urgency   AKI (acute kidney injury) (HCC)   Chronic renal disease, stage 4, severely decreased glomerular filtration rate between  15-29 mL/min/1.73 square meter (HCC)  Anasarca Volume overload Acute combined systolic/diastolic CHF -Patient was treated with high-dose IV diuretics and metolazone -He was followed by both nephrology and cardiology -Echocardiogram showed EF of 40% with grade 3 diastolic dysfunction -He was also noted to have 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 -His admission weight was 280 pounds, and this trended down to 258 pounds on discharge -Continues to have some edema at the time of discharge, but he was very insistent on discharging home -Case was reviewed with nephrology and cardiology who felt it was reasonable to discharge the patient home since he has already been transitioned to oral diuretics and has demonstrated continued good urine output and stable renal function -He will be continued on Lasix 160 mg p.o. 3 times daily and metolazone 5 mg p.o. twice daily -He has a follow-up scheduled with nephrology in 1 week with blood work prior to his visit -Cardiology will also schedule close follow-up  Accelerated hypertension -Noted to have blood pressure 225/176 in ED -He was continued on Coreg, Imdur and hydralazine -Overall blood pressures have improved with diuresis -He would benefit from outpatient sleep study  Chronic kidney disease stage IV -Creatinine remained stable on diuresis as of the 2.4-2.5 range.  Status post BKA on the left, 12/2020, he is essentially wheelchair-bound  Iron deficiency anemia -Received IV iron infusions -Hemoglobin remained stable -No evidence of bleeding.  Diabetes, type 2, uncontrolled with hyperglycemia -A1c 7.2 -continue home dose of lantus  Discharge Instructions  Discharge Instructions     Diet - low sodium heart healthy   Complete by: As directed    Discharge  wound care:   Complete by: As directed    Wash right LE and left stump with skin cleanser and pat dry.  Apply a thin layer of Eucerin cream.  Top open areas at left amputation site and right LE with folded pieces of xeroform gauze, cover with dry gauze or ABD and secure with Kerlix roll gauze/paper tape   Increase activity slowly   Complete by: As directed       Allergies as of 03/06/2021       Reactions   Amlodipine Swelling   BLE edema        Medication List     STOP taking these medications    losartan 100 MG tablet Commonly known as: COZAAR       TAKE these medications    acetaminophen 325 MG tablet Commonly known as: TYLENOL Take 650 mg by mouth every 6 (six) hours as needed for mild pain.   atorvastatin 20 MG tablet Commonly known as: LIPITOR Take 1 tablet (20 mg total) by mouth daily.   carvedilol 25 MG tablet Commonly known as: COREG Take 1 tablet (25 mg total) by mouth 2 (two) times daily with a meal.   CLEAR EYES OP Place 1 drop into both eyes as needed (irritation).   FeroSul 325 (65 FE) MG tablet Generic drug: ferrous sulfate Take 1 tablet (325 mg total) by mouth 3 (three) times daily with meals. What changed: when to take this   furosemide 80 MG tablet Commonly known as: Lasix Take 2 tablets (160 mg total) by mouth in the morning, at noon, and at bedtime. What changed:  medication strength how much to take when to take this   gabapentin 300 MG capsule Commonly known as: NEURONTIN Take 1 capsule (300 mg total) by mouth 2 (two) times daily.   hydrALAZINE 100 MG tablet Commonly known as: APRESOLINE Take 1 tablet (100 mg total) by mouth 3 (three) times daily.   insulin glargine 100 UNIT/ML Solostar Pen Commonly known as: LANTUS Inject 10 Units into the skin at bedtime. What changed: how much to take   isosorbide mononitrate 30 MG 24 hr tablet Commonly known as: IMDUR Take 1 tablet (30 mg total) by mouth daily.   metolazone 5 MG tablet Commonly known as: ZAROXOLYN Take 1 tablet (5 mg total) by mouth 2 (two) times daily.   oxyCODONE 5 MG  immediate release tablet Commonly known as: Oxy IR/ROXICODONE Take 1 tablet (5 mg total) by mouth every 4 (four) hours as needed for moderate pain (pain score 4-6).   Pentips 32G X 4 MM Misc Generic drug: Insulin Pen Needle USE TO INJECT INSULIN DAILY.   polyethylene glycol powder 17 GM/SCOOP powder Commonly known as: GLYCOLAX/MIRALAX Take 17 g by mouth 2 (two) times daily as needed.   potassium chloride 10 MEQ tablet Commonly known as: KLOR-CON Take 1 tablet (10 mEq total) by mouth 2 (two) times daily.               Discharge Care Instructions  (From admission, onward)           Start     Ordered   03/06/21 0000  Discharge wound care:       Comments: Wash right LE and left stump with skin cleanser and pat dry. Apply a thin layer of Eucerin cream.  Top open areas at left amputation site and right LE with folded pieces of xeroform gauze, cover with dry gauze or ABD and secure with Kerlix roll gauze/paper  tape   03/06/21 1159            Follow-up Information     Edrick Oh, MD Follow up on 03/12/2021.   Specialty: Nephrology Why: 8:15am Contact information: [place] [address] [phone]         Please go to [place] (any location) on 9/6 to have your blood work checked Follow up.          [name], MD. Schedule an appointment as soon as possible for a visit in 2 week(s).   Specialty: Family Medicine Contact information: [place] [address] [phone]         [name], MD .   Specialty: Cardiology Contact information: [id number] [place] [address] [phone]                Allergies  Allergen Reactions   Amlodipine Swelling    BLE edema    Consultations: Cardiology Nephrology   Procedures/Studies: DG Chest 2 View  Result Date: 02/17/2021 CLINICAL DATA:  Shortness of breath.  Fluid retention. EXAM: CHEST - 2 VIEW COMPARISON:  Chest x-ray dated February 11, 2021. FINDINGS: Stable cardiomegaly, pulmonary vascular congestion, and mild interstitial thickening. Low lung volumes with medial right basilar atelectasis. No focal consolidation, pleural effusion, or pneumothorax. No acute osseous abnormality. IMPRESSION: 1. Stable cardiomegaly and mild interstitial pulmonary edema. Resolved right pleural effusion. Electronically Signed   By: Titus Dubin M.D.   On: 02/17/2021 06:16   US RENAL  Result Date: 02/27/2021 CLINICAL DATA:  Chronic renal disease.  Stage IV EXAM: RENAL / URINARY TRACT ULTRASOUND COMPLETE COMPARISON:  X-ray abdomen 12/16/2020, ultrasound renal 12/11/2020, CT abdomen pelvis 09/07/2014 FINDINGS: Right Kidney: Renal measurements: 12.8 x 5.7 x 5.8 cm = volume: 223 mL. Echogenicity increased. No mass or hydronephrosis visualized. Left Kidney: Renal measurements: 13.2 x 7.2 x 6.4 cm = volume: 317 mL. Echogenicity increased. No mass or hydronephrosis visualized. Urinary bladder: Appears normal for degree of bladder distention. Other: At least small to moderate volume free fluid ascites. RIght pleural effusion. IMPRESSION: 1. Increased echogenicity of bilateral kidneys consistent with renal parenchymal disease. 2. At least small moderate volume simple free fluid ascites. 3. Right pleural effusion. Electronically Signed   By: Iven Finn M.D.   On: 02/27/2021 19:22   DG Chest Port 1 View  Result Date: 02/27/2021 CLINICAL DATA:  50 year old male with shortness of breath and swollen extremities. EXAM: PORTABLE CHEST 1 VIEW COMPARISON:  Chest radiographs 02/17/2021 and earlier. FINDINGS: Portable AP semi upright view at 0605 hours. Lordotic positioning. Stable lung volumes and cardiomegaly. Increasing bilateral indistinct perihilar opacity and pulmonary vascularity. New or increased veiling opacity in both lower lungs greater on the right. No pneumothorax. Paucity of bowel gas in the upper abdomen. No acute osseous abnormality identified.  IMPRESSION: Progressed pulmonary edema and bilateral pleural effusions greater on the right. Electronically Signed   By: Genevie Ann M.D.   On: 02/27/2021 06:34   DG Chest Portable 1 View  Result Date: 02/11/2021 CLINICAL DATA:  Swelling EXAM: PORTABLE CHEST 1 VIEW COMPARISON:  12/15/2020, 12/11/2020, 12/10/2020 FINDINGS: Cardiomegaly with vascular congestion and mild diffuse interstitial opacity probably mild edema. Small-moderate right pleural effusion with airspace disease at the right base. No pneumothorax IMPRESSION: 1. Cardiomegaly with vascular congestion and mild interstitial edema 2. Small moderate right pleural effusion with right basilar airspace disease Electronically Signed   By: Donavan Foil M.D.   On:  02/11/2021 19:23   ECHOCARDIOGRAM COMPLETE  Result Date: 02/27/2021    ECHOCARDIOGRAM REPORT   Patient Name:   Todd Mccoy Date of Exam: 02/27/2021 Medical Rec #:  [id number]            Height:       69.0 in Accession #:    [id number]           Weight:       300.0 lb Date of Birth:  04-10-1971            BSA:          2.455 m Patient Age:    53 years             BP:           208/142 mmHg Patient Gender: M                    HR:           75 bpm. Exam Location:  Inpatient Procedure: 2D Echo, Cardiac Doppler and Color Doppler  Reported to: Dr Cherlynn Kaiser on 02/27/2021 2:15:00 PM. Indications:    CHF  History:        Patient has no prior history of Echocardiogram examinations.                 Risk Factors:Hypertension and Diabetes.  Sonographer:    Luisa Hart RDCS Referring Phys: [id number] Mckinley Jewel  Sonographer Comments: Patient is morbidly obese. IMPRESSIONS  1. Left ventricular ejection fraction, by estimation, is 40%. The left ventricle has moderately decreased function. The left ventricle demonstrates global hypokinesis. There is moderate-severe left ventricular hypertrophy. Left ventricular diastolic parameters are consistent with Grade III diastolic dysfunction (restrictive).   2. Right ventricular systolic function is moderately reduced. The right ventricular size is mildly enlarged. Moderately increased right ventricular wall thickness. There is moderately elevated pulmonary artery systolic pressure. The estimated right ventricular systolic pressure is 35.3 mmHg.  3. Left atrial size was moderately dilated.  4. A small pericardial effusion is present. The pericardial effusion is circumferential. There is no evidence of cardiac tamponade. Large pleural effusion.  5. The mitral valve is grossly normal. Trivial mitral valve regurgitation. No evidence of mitral stenosis.  6. Tricuspid valve regurgitation is moderate.  7. The aortic valve is tricuspid. Aortic valve regurgitation is not visualized. No aortic stenosis is present.  8. The inferior vena cava is normal in size with <50% respiratory variability, suggesting right atrial pressure of 8 mmHg. FINDINGS  Left Ventricle: Left ventricular ejection fraction, by estimation, is 40%. The left ventricle has moderately decreased function. The left ventricle demonstrates global hypokinesis. The left ventricular internal cavity size was normal in size. There is moderate-severe left ventricular hypertrophy. Left ventricular diastolic parameters are consistent with Grade III diastolic dysfunction (restrictive). Right Ventricle: The right ventricular size is mildly enlarged. Moderately increased right ventricular wall thickness. Right ventricular systolic function is moderately reduced. There is moderately elevated pulmonary artery systolic pressure. The tricuspid regurgitant velocity is 3.56 m/s, and with an assumed right atrial pressure of 8 mmHg, the estimated right ventricular systolic pressure is 61.4 mmHg. Left Atrium: Left atrial size was moderately dilated. Right Atrium: Right atrial size was normal in size. Pericardium: A small pericardial effusion is present. The pericardial effusion is circumferential. There is no evidence of cardiac  tamponade. Mitral Valve: The mitral valve is grossly normal. Trivial mitral valve regurgitation. No evidence of mitral valve stenosis. MV peak  gradient, 5.0 mmHg. The mean mitral valve gradient is 2.0 mmHg. Tricuspid Valve: The tricuspid valve is grossly normal. Tricuspid valve regurgitation is moderate. Aortic Valve: The aortic valve is tricuspid. Aortic valve regurgitation is not visualized. No aortic stenosis is present. Aortic valve mean gradient measures 2.0 mmHg. Aortic valve peak gradient measures 3.1 mmHg. Aortic valve area, by VTI measures 4.57 cm. Pulmonic Valve: The pulmonic valve was thickened with good excursion. Pulmonic valve regurgitation is mild. No evidence of pulmonic stenosis. Aorta: The aortic root is normal in size and structure. Venous: The inferior vena cava is normal in size with less than 50% respiratory variability, suggesting right atrial pressure of 8 mmHg. IAS/Shunts: No atrial level shunt detected by color flow Doppler. Additional Comments: There is a large pleural effusion. Mild ascites is present.  LEFT VENTRICLE PLAX 2D LVIDd:         4.35 cm     Diastology LVIDs:         3.60 cm     LV e' medial:    2.70 cm/s LV PW:         1.40 cm     LV E/e' medial:  36.1 LV IVS:        1.65 cm     LV e' lateral:   5.70 cm/s LVOT diam:     2.40 cm     LV E/e' lateral: 17.1 LV SV:         83 LV SV Index:   34 LVOT Area:     4.52 cm  LV Volumes (MOD) LV vol d, MOD A2C: 48.3 ml LV vol d, MOD A4C: 68.2 ml LV vol s, MOD A2C: 37.9 ml LV vol s, MOD A4C: 62.8 ml LV SV MOD A2C:     10.4 ml LV SV MOD A4C:     68.2 ml LV SV MOD BP:      9.3 ml RIGHT VENTRICLE RV Basal diam:  4.70 cm RV Mid diam:    3.80 cm RV S prime:     6.98 cm/s TAPSE (M-mode): 1.8 cm LEFT ATRIUM              Index       RIGHT ATRIUM           Index LA diam:        5.20 cm  2.12 cm/m  RA Area:     19.20 cm LA Vol (A2C):   85.4 ml  34.79 ml/m RA Volume:   53.70 ml  21.88 ml/m LA Vol (A4C):   118.0 ml 48.07 ml/m LA Biplane Vol:  104.0 ml 42.37 ml/m  AORTIC VALVE                   PULMONIC VALVE AV Area (Vmax):    4.65 cm    PV Vmax:          0.60 m/s AV Area (Vmean):   4.59 cm    PV Vmean:         40.700 cm/s AV Area (VTI):     4.57 cm    PV VTI:           0.130 m AV Vmax:           88.30 cm/s  PV Peak grad:     1.5 mmHg AV Vmean:          62.100 cm/s PV Mean grad:     1.0 mmHg AV VTI:  0.181 m     PR End Diast Vel: 13.25 msec AV Peak Grad:      3.1 mmHg AV Mean Grad:      2.0 mmHg LVOT Vmax:         90.70 cm/s LVOT Vmean:        63.000 cm/s LVOT VTI:          0.183 m LVOT/AV VTI ratio: 1.01  AORTA Ao Root diam: 3.10 cm Ao Asc diam:  2.80 cm MITRAL VALVE               TRICUSPID VALVE MV Area (PHT): 4.94 cm    TR Peak grad:   50.7 mmHg MV Area VTI:   3.16 cm    TR Vmax:        356.00 cm/s MV Peak grad:  5.0 mmHg MV Mean grad:  2.0 mmHg    SHUNTS MV Vmax:       1.12 m/s    Systemic VTI:  0.18 m MV Vmean:      70.5 cm/s   Systemic Diam: 2.40 cm MV Decel Time: 154 msec MV E velocity: 97.43 cm/s MV A velocity: 48.77 cm/s MV E/A ratio:  2.00 Cherlynn Kaiser MD Electronically signed by Cherlynn Kaiser MD Signature Date/Time: 02/27/2021/2:38:23 PM    Final    VAS US RENAL ARTERY DUPLEX  Result Date: 03/01/2021 ABDOMINAL VISCERAL Patient Name:  Todd Mccoy  Date of Exam:   02/28/2021 Medical Rec #: [id number]             Accession #:    [id number] Date of Birth: 08/07/1970             Patient Gender: M Patient Age:   67 years Exam Location:  [facility] Procedure:      VAS US RENAL ARTERY DUPLEX Referring Phys: [id number] DAYNA N DUNN -------------------------------------------------------------------------------- Indications: Hypertension, CKD 4 Limitations: Air/bowel gas, obesity and limited ability to reposition. Comparison Study: 02-27-2021 US Renal showed increased echogenicity of bilateral                   kidneys. Performing Technologist: Darlin Coco RDMS, RVT  Examination Guidelines: A complete evaluation  includes B-mode imaging, spectral Doppler, color Doppler, and power Doppler as needed of all accessible portions of each vessel. Bilateral testing is considered an integral part of a complete examination. Limited examinations for reoccurring indications may be performed as noted.  Duplex Findings: +----------------------+--------+--------+------+--------+ Mesenteric            PSV cm/sEDV cm/sPlaqueComments +----------------------+--------+--------+------+--------+ Aorta Mid                81                          +----------------------+--------+--------+------+--------+ Celiac Artery Proximal   86                          +----------------------+--------+--------+------+--------+ SMA Proximal            233                          +----------------------+--------+--------+------+--------+    +------------------+--------+--------+-------+ Right Renal ArteryPSV cm/sEDV cm/sComment +------------------+--------+--------+-------+ Origin               22      4            +------------------+--------+--------+-------+ Proximal  44      10           +------------------+--------+--------+-------+ Mid                  41      13           +------------------+--------+--------+-------+ Distal               40      10           +------------------+--------+--------+-------+ +-----------------+--------+--------+------------------------------------------+ Left Renal ArteryPSV cm/sEDV cm/s                 Comment                   +-----------------+--------+--------+------------------------------------------+ Origin                           Unable to insonate secondary to overlying                                       bowel gas and patient positioning      +-----------------+--------+--------+------------------------------------------+ Proximal                         Unable to insonate secondary to overlying                                        bowel gas and patient positioning      +-----------------+--------+--------+------------------------------------------+ Mid                 57      8                                               +-----------------+--------+--------+------------------------------------------+ Distal                           Unable to insonate secondary to overlying                                       bowel gas and patient positioning      +-----------------+--------+--------+------------------------------------------+  Technologist observations: Limited views of left kidney secondary to overlying bowel gas, free fluid in the abdomen, bowel loops, as well as limited ability of patient to reposition. +------------+--------+--------+----+-----------+--------+--------+----+ Right KidneyPSV cm/sEDV cm/sRI  Left KidneyPSV cm/sEDV cm/sRI   +------------+--------+--------+----+-----------+--------+--------+----+ Upper Pole  10      5       0.46Upper Pole 13      4       0.67 +------------+--------+--------+----+-----------+--------+--------+----+ Mid         10      4       0.56Mid        14      7       0.50 +------------+--------+--------+----+-----------+--------+--------+----+ Lower Pole  9       4       0.58Lower Pole 17      6       0.67 +------------+--------+--------+----+-----------+--------+--------+----+ Hilar  22      4       0.80Hilar      22      6       0.71 +------------+--------+--------+----+-----------+--------+--------+----+ +------------------+-----+------------------+----+ Right Kidney           Left Kidney            +------------------+-----+------------------+----+ RAR                    RAR                    +------------------+-----+------------------+----+ RAR (manual)      0.54 RAR (manual)      0.70 +------------------+-----+------------------+----+ Cortex            0.60 Cortex            0.64  +------------------+-----+------------------+----+ Cortex thickness       Corex thickness        +------------------+-----+------------------+----+ Kidney length (cm)11.97Kidney length (cm)     +------------------+-----+------------------+----+  Summary: Renal:  Right: Normal size right kidney. RRV flow present. Normal right        Resisitive Index. No evidence of right renal artery stenosis. Left:  Normal left Resistive Index. No evidence of stenosis in        visualized segment; however, views were extremely limited        during today's examination. Mesenteric: Normal Celiac artery and Superior Mesenteric artery findings.  *See table(s) above for measurements and observations.  Diagnosing physician: Monica Martinez MD  Electronically signed by Monica Martinez MD on 03/01/2021 at 2:23:22 PM.    Final       Subjective: Denies any shortness of breath.  Feels that overall edema is better.  Very anxious to go home.  Discharge Exam: Vitals:   03/05/21 1221 03/05/21 2054 03/06/21 0502 03/06/21 1513  BP: 138/81 (!) 152/86 (!) 148/83 (!) 145/84  Pulse: 78 84 82 73  Resp: _0 Temp: 98.6 F (37 C) 99 F (37.2 C) 98.2 F (36.8 C)   TempSrc: Oral Oral Oral   SpO2: 100% 96% 99% 98%  Weight:   117.3 kg   Height:        General: Pt is alert, awake, not in acute distress Cardiovascular: RRR, S1/S2 +, no rubs, no gallops Respiratory: CTA bilaterally, no wheezing, no rhonchi Abdominal: Soft, NT, ND, bowel sounds + Extremities: 1+edema, no cyanosis    The results of significant diagnostics from this hospitalization (including imaging, microbiology, ancillary and laboratory) are listed below for reference.     Microbiology: Recent Results (from the past 240 hour(s))  Resp Panel by RT-PCR (Flu A&B, Covid) Nasopharyngeal Swab     Status: None   Collection Time: 02/27/21  5:26 AM   Specimen: Nasopharyngeal Swab; Nasopharyngeal(NP) swabs in vial transport medium  Result Value  Ref Range Status   SARS Coronavirus 2 by RT PCR NEGATIVE NEGATIVE Final    Comment: (NOTE) SARS-CoV-2 target nucleic acids are NOT DETECTED.  The SARS-CoV-2 RNA is generally detectable in upper respiratory specimens during the acute phase of infection. The lowest concentration of SARS-CoV-2 viral copies this assay can detect is 138 copies/mL. A negative result does not preclude SARS-Cov-2 infection and should not be used as the sole basis for treatment or other patient management decisions. A negative result may occur with  improper specimen collection/handling, submission of specimen other than nasopharyngeal swab, presence of viral mutation(s) within the areas targeted by this assay, and  inadequate number of viral copies(<138 copies/mL). A negative result must be combined with clinical observations, patient history, and epidemiological information. The expected result is Negative.  Fact Sheet for Patients:  EntrepreneurPulse.com.au  Fact Sheet for Healthcare Providers:  IncredibleEmployment.be  This test is no t yet approved or cleared by the [place] FDA and  has been authorized for detection and/or diagnosis of SARS-CoV-2 by FDA under an Emergency Use Authorization (EUA). This EUA will remain  in effect (meaning this test can be used) for the duration of the COVID-19 declaration under Section 564(b)(1) of the Act, 21 U.S.C.section 360bbb-3(b)(1), unless the authorization is terminated  or revoked sooner.       Influenza A by PCR NEGATIVE NEGATIVE Final   Influenza B by PCR NEGATIVE NEGATIVE Final    Comment: (NOTE) The Xpert Xpress SARS-CoV-2/FLU/RSV plus assay is intended as an aid in the diagnosis of influenza from Nasopharyngeal swab specimens and should not be used as a sole basis for treatment. Nasal washings and aspirates are unacceptable for Xpert Xpress SARS-CoV-2/FLU/RSV testing.  Fact Sheet for  Patients: EntrepreneurPulse.com.au  Fact Sheet for Healthcare Providers: IncredibleEmployment.be  This test is not yet approved or cleared by the [place] FDA and has been authorized for detection and/or diagnosis of SARS-CoV-2 by FDA under an Emergency Use Authorization (EUA). This EUA will remain in effect (meaning this test can be used) for the duration of the COVID-19 declaration under Section 564(b)(1) of the Act, 21 U.S.C. section 360bbb-3(b)(1), unless the authorization is terminated or revoked.  Performed at [facility], [place] [address] [place], [place] [address]      Labs: BNP (last 3 results) Recent Labs    12/18/20 0057 02/17/21 0600 02/27/21 0622  BNP 1,329.4* >4,500.0* >4,944.9*   Basic Metabolic Panel: Recent Labs  Lab 02/28/21 0536 03/01/21 0517 03/02/21 0428 03/03/21 0459 03/04/21 0454 03/05/21 0456 03/06/21 0441  NA 146*   < > 142 140  140 144  144 140 138  K 4.0   < > 4.1 3.8  3.8 4.1  4.1 3.8 4.1  CL 117*   < > 112* 111  111 110  110 106 104  CO2 21*   < > _0 GLUCOSE 146*   < > 158* 230*  230* 209*  210* 166* 204*  BUN 54*   < > 58* 56*  54* 61*  59* 59* 60*  CREATININE 2.48*   < > 2.56* 2.69*  2.44* 2.78*  2.76* 2.57* 2.75*  CALCIUM 8.4*   < > 8.6* 8.2*  8.2* 8.3*  8.3* 8.0* 8.1*  MG 1.9  --   --   --   --   --   --   PHOS  --   --   --  4.9* 4.8* 4.3 4.0   < > = values in this interval not displayed.   Liver Function Tests: Recent Labs  Lab 03/03/21 0459 03/04/21 0454 03/05/21 0456 03/06/21 0441  ALBUMIN 2.1* 2.0* 2.0* 2.0*   No results for input(s): LIPASE, AMYLASE in the last 168 hours. No results for input(s): AMMONIA in the last 168 hours. CBC: Recent Labs  Lab 02/28/21 0536 03/01/21 0517 03/03/21 0459 03/05/21 0456  WBC 7.0 6.1 7.2 6.8  HGB 8.7* 8.5* 9.4* 8.8*  HCT 27.9* 27.6* 31.3* 28.9*  MCV 90.6 91.1 94.0 91.7  PLT 229  209 221 241   Cardiac Enzymes: No results for input(s): CKTOTAL,  CKMB, CKMBINDEX, TROPONINI in the last 168 hours. BNP: Invalid input(s): POCBNP CBG: Recent Labs  Lab 03/05/21 1209 03/05/21 1658 03/05/21 2051 03/06/21 0809 03/06/21 1113  GLUCAP 173* 152* 185* 194* 174*   D-Dimer No results for input(s): DDIMER in the last 72 hours. Hgb A1c No results for input(s): HGBA1C in the last 72 hours. Lipid Profile No results for input(s): CHOL, HDL, LDLCALC, TRIG, CHOLHDL, LDLDIRECT in the last 72 hours. Thyroid function studies No results for input(s): TSH, T4TOTAL, T3FREE, THYROIDAB in the last 72 hours.  Invalid input(s): FREET3 Anemia work up No results for input(s): VITAMINB12, FOLATE, FERRITIN, TIBC, IRON, RETICCTPCT in the last 72 hours. Urinalysis    Component Value Date/Time   COLORURINE AMBER (A) 02/27/2021 0813   APPEARANCEUR HAZY (A) 02/27/2021 0813   LABSPEC 1.023 02/27/2021 0813   PHURINE 5.0 02/27/2021 0813   GLUCOSEU 50 (A) 02/27/2021 0813   HGBUR MODERATE (A) 02/27/2021 0813   BILIRUBINUR NEGATIVE 02/27/2021 0813   KETONESUR NEGATIVE 02/27/2021 0813   PROTEINUR >=300 (A) 02/27/2021 0813   UROBILINOGEN 1.0 09/06/2014 2307   NITRITE NEGATIVE 02/27/2021 0813   LEUKOCYTESUR NEGATIVE 02/27/2021 0813   Sepsis Labs Invalid input(s): PROCALCITONIN,  WBC,  LACTICIDVEN Microbiology Recent Results (from the past 240 hour(s))  Resp Panel by RT-PCR (Flu A&B, Covid) Nasopharyngeal Swab     Status: None   Collection Time: 02/27/21  5:26 AM   Specimen: Nasopharyngeal Swab; Nasopharyngeal(NP) swabs in vial transport medium  Result Value Ref Range Status   SARS Coronavirus 2 by RT PCR NEGATIVE NEGATIVE Final    Comment: (NOTE) SARS-CoV-2 target nucleic acids are NOT DETECTED.  The SARS-CoV-2 RNA is generally detectable in upper respiratory specimens during the acute phase of infection. The lowest concentration of SARS-CoV-2 viral copies this assay can detect is 138  copies/mL. A negative result does not preclude SARS-Cov-2 infection and should not be used as the sole basis for treatment or other patient management decisions. A negative result may occur with  improper specimen collection/handling, submission of specimen other than nasopharyngeal swab, presence of viral mutation(s) within the areas targeted by this assay, and inadequate number of viral copies(<138 copies/mL). A negative result must be combined with clinical observations, patient history, and epidemiological information. The expected result is Negative.  Fact Sheet for Patients:  EntrepreneurPulse.com.au  Fact Sheet for Healthcare Providers:  IncredibleEmployment.be  This test is no t yet approved or cleared by the [place] FDA and  has been authorized for detection and/or diagnosis of SARS-CoV-2 by FDA under an Emergency Use Authorization (EUA). This EUA will remain  in effect (meaning this test can be used) for the duration of the COVID-19 declaration under Section 564(b)(1) of the Act, 21 U.S.C.section 360bbb-3(b)(1), unless the authorization is terminated  or revoked sooner.       Influenza A by PCR NEGATIVE NEGATIVE Final   Influenza B by PCR NEGATIVE NEGATIVE Final    Comment: (NOTE) The Xpert Xpress SARS-CoV-2/FLU/RSV plus assay is intended as an aid in the diagnosis of influenza from Nasopharyngeal swab specimens and should not be used as a sole basis for treatment. Nasal washings and aspirates are unacceptable for Xpert Xpress SARS-CoV-2/FLU/RSV testing.  Fact Sheet for Patients: EntrepreneurPulse.com.au  Fact Sheet for Healthcare Providers: IncredibleEmployment.be  This test is not yet approved or cleared by the [place] FDA and has been authorized for detection and/or diagnosis of SARS-CoV-2 by FDA under an Emergency Use Authorization (EUA). This EUA will remain in effect (meaning  this test can be used) for the duration of the COVID-19 declaration under Section 564(b)(1) of the Act, 21 U.S.C. section 360bbb-3(b)(1), unless the authorization is terminated or revoked.  Performed at [facility], [place] [address], [place], [place] [address]      Time coordinating discharge: 86mns  SIGNED:   JKathie Dike MD  [facility] 03/06/2021, 8:25 PM   If 7PM-7AM, please contact night-coverage [email or web address]

## 2021-03-06 NOTE — Plan of Care (Signed)

## 2021-03-06 NOTE — Progress Notes (Signed)
Physical Therapy Treatment Patient Details Name: Todd Mccoy MRN: [id number] DOB: Oct 23, 1970 Today's Date: 03/06/2021    History of Present Illness Patient is a 50 year old male presenting with diarrhea, swelling, shortness of breath for more than a month with multiple ED visits. Pt diagnosed with progressive pulmonary edema and bilateral pleural effusion, Acute combined systolic/diastolic CHF superimposed on baseline renal insufficiency and hypoalbuminemia, anascarsa. 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 Would continue to recommend SNF as this is pt's first time OOB since admission.  However, Pt eager to d/c home.   Follow Up Recommendations  SNF (would benefit from SNF however plan is for home per chart)     Equipment Recommendations  Other (comment) (WC and BSC are too small/narrow. May need home assessment and evaluation to see if wider equipment can be accommodated in home)    Recommendations for Other Services       Precautions / Restrictions Precautions Precautions: Fall Precaution Comments: swollen scrotum, does not yet have prosthesis for L LE    Mobility  Bed Mobility Overal bed mobility: Needs Assistance Bed Mobility: Supine to Sit     Supine to sit: HOB elevated;Min guard     General bed mobility comments: increased time and effort however no physical assist required    Transfers Overall transfer level: Needs assistance Equipment used: None Transfers: Lateral/Scoot Transfers          Lateral/Scoot Transfers: From elevated surface;Min guard;+2 safety/equipment General transfer comment: provided drop arm recliner up against slightly elevated bed for pt to perform lateral transfer, pt able to perform without assist however required increased time (reportedly due to irritation and  edema of scrotum (has make-shift device set up to suction for urine))  Ambulation/Gait                 Stairs             Wheelchair Mobility    Modified Rankin (Stroke Patients Only)       Balance Overall balance assessment: Needs assistance Sitting-balance support: Feet supported;No upper extremity supported Sitting balance-Leahy Scale: Good                                      Cognition Arousal/Alertness: Awake/alert Behavior During Therapy: WFL for tasks assessed/performed Overall Cognitive Status: Within Functional Limits for tasks assessed                                        Exercises      General Comments        Pertinent Vitals/Pain Pain Assessment: Faces Faces Pain Scale: Hurts little more Pain Location: scrotum Pain Descriptors / Indicators: Sore Pain Intervention(s): Repositioned;Monitored during session    Home Living                      Prior Function            PT Goals (current goals can now be found in the care plan section) Progress towards PT goals: Progressing toward goals    Frequency    Min 2X/week  PT Plan Current plan remains appropriate    Co-evaluation              AM-PAC PT "6 Clicks" Mobility   Outcome Measure  Help needed turning from your back to your side while in a flat bed without using bedrails?: A Little Help needed moving from lying on your back to sitting on the side of a flat bed without using bedrails?: A Little Help needed moving to and from a bed to a chair (including a wheelchair)?: A Little Help needed standing up from a chair using your arms (e.g., wheelchair or bedside chair)?: A Lot Help needed to walk in hospital room?: Total Help needed climbing 3-5 steps with a railing? : Total 6 Click Score: 13    End of Session   Activity Tolerance: Patient tolerated treatment well Patient left: in chair;with call bell/phone within reach;with  chair alarm set Nurse Communication: Mobility status (lateral scoot transfer) PT Visit Diagnosis: Muscle weakness (generalized) (M62.81)     Time: 0940-1000 PT Time Calculation (min) (ACUTE ONLY): 20 min  Charges:  $Therapeutic Activity: 8-22 mins                     Jannette Spanner PT, DPT Acute Rehabilitation Services Pager: [phone] Office: [phone]    York Ram E 03/06/2021, 12:32 PM

## 2021-03-10 ENCOUNTER — Telehealth: Payer: Self-pay

## 2021-03-10 DIAGNOSIS — N1832 Chronic kidney disease, stage 3b: Secondary | ICD-10-CM | POA: Diagnosis not present

## 2021-03-10 NOTE — Telephone Encounter (Signed)
From the discharge call:  He said he is doing okay and did not report any questions/concerns. He said he has all medications and did not have any questions about his med regime.  He said the he needs to purchase supplies for wound care to RLE and left stump.  he stated he is independent with personal care, left BKA - uses wheelchair for mobility. Has bedside commode.  Scheduled to see Dr Margarita Rana  on 03/19/2021 @ 1050, nephrology- 03/12/2021; orthopedics- 03/18/2021, cardiology - 04/07/2021. he is aware of all appointments and said he transportation .

## 2021-03-10 NOTE — Telephone Encounter (Signed)
Transition Care Management Unsuccessful Follow-up Telephone Call  Date of discharge and from where:  03/06/2021 from [facility]  Attempts:  1st Attempt  Reason for unsuccessful TCM follow-up call:  Left voice message

## 2021-03-10 NOTE — Telephone Encounter (Signed)
Transition Care Management Follow-up Telephone Call Date of discharge and from where: 03/06/2021, [facility] How have you been since you were released from the hospital? He said he is doing okay.  Any questions or concerns? No  Items Reviewed: Did the pt receive and understand the discharge instructions provided? Yes he said he has them but not with him at the time of this call.  Medications obtained and verified? Yes -he said he has all medications and he did not have any questions about his med regime.  Other? No  Any new allergies since your discharge? No  Do you have support at home? Yes   Home Care and Equipment/Supplies: Were home health services ordered? no If so, what is the name of the agency? N/a  Has the agency set up a time to come to the patient's home? no Were any new equipment or medical supplies ordered?  No What is the name of the medical supply agency? N/a Were you able to get the supplies/equipment? not applicable Do you have any questions related to the use of the equipment or supplies? No  He said the he needs to purchase supplies for wound care to RLE and left stump.   Functional Questionnaire: (I = Independent and D = Dependent) ADLs: he stated he is independent with personal care, left BKA - uses wheelchair for mobility. Has bedside commode.    Follow up appointments reviewed:  PCP Hospital f/u appt confirmed? Yes  Scheduled to see Dr Margarita Rana  on 03/19/2021 @ 1050. [facility] f/u appt confirmed? Yes  Scheduled to see nephrology- 03/12/2021; orthopedics- 03/18/2021, cardiology - 04/07/2021.  Are transportation arrangements needed? No  If their condition worsens, is the pt aware to call PCP or go to the Emergency Dept.? Yes Was the patient provided with contact information for the PCP's office or ED? Yes Was to pt encouraged to call back with questions or concerns? Yes

## 2021-03-12 DIAGNOSIS — E1122 Type 2 diabetes mellitus with diabetic chronic kidney disease: Secondary | ICD-10-CM | POA: Diagnosis not present

## 2021-03-12 DIAGNOSIS — R809 Proteinuria, unspecified: Secondary | ICD-10-CM | POA: Diagnosis not present

## 2021-03-12 DIAGNOSIS — N2581 Secondary hyperparathyroidism of renal origin: Secondary | ICD-10-CM | POA: Diagnosis not present

## 2021-03-12 DIAGNOSIS — N184 Chronic kidney disease, stage 4 (severe): Secondary | ICD-10-CM | POA: Diagnosis not present

## 2021-03-12 DIAGNOSIS — E11319 Type 2 diabetes mellitus with unspecified diabetic retinopathy without macular edema: Secondary | ICD-10-CM | POA: Diagnosis not present

## 2021-03-12 DIAGNOSIS — I129 Hypertensive chronic kidney disease with stage 1 through stage 4 chronic kidney disease, or unspecified chronic kidney disease: Secondary | ICD-10-CM | POA: Diagnosis not present

## 2021-03-12 NOTE — Telephone Encounter (Signed)
Transition Care Management Follow-up Telephone Call Date of discharge and from where: 03/06/2021 - [facility]  How have you been since you were released from the hospital? "I am alright" Any questions or concerns? No  Items Reviewed: Did the pt receive and understand the discharge instructions provided? Yes  Medications obtained and verified?  N/A Other? No  Any new allergies since your discharge? No  Dietary orders reviewed? No Do you have support at home? Yes    Functional Questionnaire: (I = Independent and D = Dependent) ADLs: I  Bathing/Dressing- I  Meal Prep- I  Eating- I  Maintaining continence- I  Transferring/Ambulation- I  Managing Meds- I  Follow up appointments reviewed:  PCP Hospital f/u appt confirmed? Yes  Scheduled to see Dr. Margarita Rana on 03/19/2021 @ 1050. [facility] f/u appt confirmed? No   Are transportation arrangements needed? No  - Patient has been given the phone number for [facility] [employer] in case anything changes. If their condition worsens, is the pt aware to call PCP or go to the Emergency Dept.? Yes Was the patient provided with contact information for the PCP's office or ED? Yes Was to pt encouraged to call back with questions or concerns? Yes

## 2021-03-18 ENCOUNTER — Ambulatory Visit (INDEPENDENT_AMBULATORY_CARE_PROVIDER_SITE_OTHER): Payer: Medicaid Other | Admitting: Physician Assistant

## 2021-03-18 ENCOUNTER — Encounter: Payer: Self-pay | Admitting: Physician Assistant

## 2021-03-18 ENCOUNTER — Telehealth: Payer: Self-pay

## 2021-03-18 DIAGNOSIS — Z89512 Acquired absence of left leg below knee: Secondary | ICD-10-CM

## 2021-03-18 MED ORDER — OXYCODONE HCL 5 MG PO TABS: 5.0000 mg | ORAL_TABLET | Freq: Four times a day (QID) | ORAL | 0 refills | Status: DC | PRN

## 2021-03-18 MED ORDER — PREGABALIN 25 MG PO CAPS: 25.0000 mg | ORAL_CAPSULE | Freq: Two times a day (BID) | ORAL | 0 refills | Status: DC

## 2021-03-18 NOTE — Progress Notes (Signed)
Office Visit Note   Patient: Todd Mccoy           Date of Birth: 01/14/1971           MRN: [id number] Visit Date: 03/18/2021              Requested by: [name], MD [place],  [place] [address] PCP: [name], MD  Chief Complaint  Patient presents with   Left Knee - Follow-up      HPI: Patient is a pleasant 50 year old gentleman who is 3 months status post below-knee amputation.  He is struggling quite a bit with phantom pain.  He has been taking 300 mg of gabapentin once daily but does not think this is very helpful.  Wondering if there is another medication he can try he is going to go see [facility].  This got delayed as he was hospitalized for what he tells me was swelling  Assessment & Plan: Visit Diagnoses:  1. S/P BKA (below knee amputation) unilateral, left ([place])     Plan: He will make a follow-up with [facility].  Have also given him a prescription for smaller shrinkers as his are too large.  We will try Lyrica calculated at a renal dose based on his creatinine clearance would be 25 mg twice daily.  He has failed treatment for his phantom pain with gabapentin.  Follow-up in 1 month.  Have also placed a referral to physical therapy  Follow-Up Instructions: No follow-ups on file.   Ortho Exam  Patient is alert, oriented, no adenopathy, well-dressed, normal affect, normal respiratory effort. Left below-knee amputation stump some thin eschars but overall well-healed wound.  No ascending cellulitis.  He has good knee range of motion.  Swelling fairly well controlled.  No drainage  Imaging: No results found. No images are attached to the encounter.  Labs: Lab Results  Component Value Date   HGBA1C 7.2 (H) 02/27/2021   HGBA1C 7.1 (A) 02/25/2021   HGBA1C 8.6 (H) 11/12/2020   ESRSEDRATE 134 (H) 12/10/2020   ESRSEDRATE 126 (H) 03/24/2020   CRP 29.8 (H) 12/10/2020   LABURIC 9.1 (H) 12/15/2020   REPTSTATUS 12/22/2020 FINAL 12/21/2020    GRAMSTAIN  11/07/2019    NO WBC SEEN ABUNDANT GRAM POSITIVE COCCI FEW GRAM POSITIVE RODS    CULT (A) 12/21/2020    30,000 COLONIES/mL MULTIPLE SPECIES PRESENT, SUGGEST RECOLLECTION   LABORGA STAPHYLOCOCCUS AUREUS 11/07/2019     Lab Results  Component Value Date   ALBUMIN 2.0 (L) 03/06/2021   ALBUMIN 2.0 (L) 03/05/2021   ALBUMIN 2.0 (L) 03/04/2021   PREALBUMIN 5.5 (L) 12/10/2020    Lab Results  Component Value Date   MG 1.9 02/28/2021   MG 1.8 02/27/2021   MG 2.0 12/14/2020   No results found for: VD25OH  Lab Results  Component Value Date   PREALBUMIN 5.5 (L) 12/10/2020   CBC EXTENDED Latest Ref Rng & Units 03/05/2021 03/03/2021 03/01/2021  WBC 4.0 - 10.5 K/uL 6.8 7.2 6.1  RBC 4.22 - 5.81 MIL/uL 3.15(L) 3.33(L) 3.03(L)  HGB 13.0 - 17.0 g/dL 8.8(L) 9.4(L) 8.5(L)  HCT 39.0 - 52.0 % 28.9(L) 31.3(L) 27.6(L)  PLT 150 - 400 K/uL 241 221 209  NEUTROABS 1.7 - 7.7 K/uL - - -  LYMPHSABS 0.7 - 4.0 K/uL - - -     There is no height or weight on file to calculate BMI.  Orders:  Orders Placed This Encounter  Procedures   Ambulatory referral to Physical  Therapy   Meds ordered this encounter  Medications   oxyCODONE (OXY IR/ROXICODONE) 5 MG immediate release tablet    Sig: Take 1 tablet (5 mg total) by mouth every 6 (six) hours as needed for moderate pain (pain score 4-6).    Dispense:  30 tablet    Refill:  0   pregabalin (LYRICA) 25 MG capsule    Sig: Take 1 capsule (25 mg total) by mouth 2 (two) times daily.    Dispense:  60 capsule    Refill:  0     Procedures: No procedures performed  Clinical Data: No additional findings.  ROS:  All other systems negative, except as noted in the HPI. Review of Systems  Objective: Vital Signs: There were no vitals taken for this visit.  Specialty Comments:  No specialty comments available.  PMFS History: Patient Active Problem List   Diagnosis Date Noted   Chronic renal disease, stage 4, severely decreased glomerular  filtration rate between 15-29 mL/min/1.73 square meter (HCC)    Hypertensive urgency    AKI (acute kidney injury) ([place])    Acute CHF (congestive heart failure) ([place]) 02/27/2021   Normocytic anemia 02/27/2021   Anasarca 02/27/2021   Abscess of left foot 12/10/2020   S/P BKA (below knee amputation) unilateral, left ([place]) 12/10/2020   Dyslipidemia 12/10/2020   CKD (chronic kidney disease), stage III ([place]) 12/10/2020   Subacute osteomyelitis, left ankle and foot (HCC)    Osteomyelitis of fifth toe of left foot (HCC)    Severe protein-calorie malnutrition ([place])    HTN (hypertension) 11/12/2020   Osteomyelitis of second toe of right foot ([place])    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[place])    Cellulitis and abscess of foot    Toe osteomyelitis ([place]) 03/24/2020   Necrotizing fasciitis ([place]) 09/08/2014   Diabetes mellitus with hyperglycemia ([place]) 09/07/2014   Tobacco abuse 09/07/2014   Abscess of back    Abscess of lower back 09/06/2014   DM2 (diabetes mellitus, type 2) ([place]) 09/06/2014   Sepsis ([place]) 09/06/2014   Scrotal abscess 06/20/2014   Past Medical History:  Diagnosis Date   Anemia    Anemia    Anxiety    CKD (chronic kidney disease), stage IV ([place]) 12/10/2020   Diabetes mellitus with complication (HCC)    Diabetic neuropathy (HCC)    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[place]) 03/25/2020   pt states retinal eye damage- left worse than the right- recent visited MD    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[place]) 03/25/2020   pt states recent MD visit /left eye worse than right   Dyslipidemia 12/10/2020   Hx of BKA, left ([place])    Hypertension    Morbid obesity ([place])    Osteomyelitis ([place])    Pneumonia     Family History  Problem Relation Age of Onset   Diabetes Mother    Diabetes Father    Diabetes Brother    Colon cancer Neg Hx    Esophageal cancer Neg Hx    Pancreatic cancer Neg Hx    Stomach cancer Neg Hx    Liver disease Neg Hx    CAD Neg Hx     Past Surgical  History:  Procedure Laterality Date   ABSCESS DRAINAGE     neck   AMPUTATION Left 11/14/2020   Procedure: LEFT 5TH RAY AMPUTATION;  Surgeon: Newt Minion, MD;  Location: [facility];  Service: Orthopedics;  Laterality: Left;   AMPUTATION Left 12/10/2020  Procedure: LEFT BELOW KNEE AMPUTATION;  Surgeon: Newt Minion, MD;  Location: [facility];  Service: Orthopedics;  Laterality: Left;   AMPUTATION TOE Right 03/25/2020   Procedure: AMPUTATION TOE;  Surgeon: Evelina Bucy, DPM;  Location: [facility];  Service: Podiatry;  Laterality: Right;   INCISION AND DRAINAGE ABSCESS Right 09/07/2014   Procedure: INCISION AND DRAINAGE ABSCESS RIGHT FLANK;  Surgeon: Jackolyn Confer, MD;  Location: [facility];  Service: General;  Laterality: Right;   LEG AMPUTATION BELOW KNEE Left 12/10/2020   SCROTUM EXPLORATION     Social History   Occupational History   Not on file  Tobacco Use   Smoking status: Every Day    Packs/day: 1.00    Years: 32.00    Pack years: 32.00    Types: Cigarettes   Smokeless tobacco: Never   Tobacco comments:    Currently down to 1/2 ppd  Vaping Use   Vaping Use: Never used  Substance and Sexual Activity   Alcohol use: No   Drug use: No   Sexual activity: Not on file

## 2021-03-18 NOTE — Telephone Encounter (Signed)
Request sent through cover my meds for Oxycodone rx. Will hold this message pending approval

## 2021-03-19 ENCOUNTER — Ambulatory Visit: Payer: Medicaid Other | Attending: Family Medicine | Admitting: Family Medicine

## 2021-03-19 ENCOUNTER — Other Ambulatory Visit: Payer: Self-pay

## 2021-03-19 ENCOUNTER — Encounter: Payer: Self-pay | Admitting: Family Medicine

## 2021-03-19 VITALS — BP 176/113 | HR 99

## 2021-03-19 DIAGNOSIS — E11628 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 complications: Secondary | ICD-10-CM | POA: Diagnosis not present

## 2021-03-19 DIAGNOSIS — E11319 Type 2 diabetes mellitus with unspecified diabetic retinopathy without macular edema: Secondary | ICD-10-CM | POA: Diagnosis not present

## 2021-03-19 DIAGNOSIS — I130 Hypertensive heart and chronic kidney disease with heart failure and stage 1 through stage 4 chronic kidney disease, or unspecified chronic kidney disease: Secondary | ICD-10-CM | POA: Diagnosis not present

## 2021-03-19 DIAGNOSIS — I5041 Acute combined systolic (congestive) and diastolic (congestive) heart failure: Secondary | ICD-10-CM | POA: Insufficient documentation

## 2021-03-19 DIAGNOSIS — Z89512 Acquired absence of left leg below knee: Secondary | ICD-10-CM | POA: Insufficient documentation

## 2021-03-19 DIAGNOSIS — N184 Chronic kidney disease, stage 4 (severe): Secondary | ICD-10-CM | POA: Insufficient documentation

## 2021-03-19 DIAGNOSIS — Z79899 Other long term (current) drug therapy: Secondary | ICD-10-CM | POA: Insufficient documentation

## 2021-03-19 DIAGNOSIS — E1122 Type 2 diabetes mellitus with diabetic chronic kidney disease: Secondary | ICD-10-CM | POA: Diagnosis not present

## 2021-03-19 DIAGNOSIS — G4709 Other insomnia: Secondary | ICD-10-CM | POA: Diagnosis not present

## 2021-03-19 DIAGNOSIS — Z833 Family history of diabetes mellitus: Secondary | ICD-10-CM | POA: Insufficient documentation

## 2021-03-19 DIAGNOSIS — I129 Hypertensive chronic kidney disease with stage 1 through stage 4 chronic kidney disease, or unspecified chronic kidney disease: Secondary | ICD-10-CM

## 2021-03-19 DIAGNOSIS — Z888 Allergy status to other drugs, medicaments and biological substances status: Secondary | ICD-10-CM | POA: Insufficient documentation

## 2021-03-19 DIAGNOSIS — Z794 Long term (current) use of insulin: Secondary | ICD-10-CM | POA: Insufficient documentation

## 2021-03-19 DIAGNOSIS — F419 Anxiety disorder, unspecified: Secondary | ICD-10-CM | POA: Diagnosis not present

## 2021-03-19 MED ORDER — HYDROXYZINE HCL 25 MG PO TABS: 25.0000 mg | ORAL_TABLET | Freq: Every evening | ORAL | 1 refills | Status: DC | PRN

## 2021-03-19 MED ORDER — ISOSORBIDE MONONITRATE ER 60 MG PO TB24: 60.0000 mg | ORAL_TABLET | Freq: Every day | ORAL | 3 refills | Status: DC

## 2021-03-19 NOTE — Progress Notes (Signed)
Not sleeping 

## 2021-03-19 NOTE — Telephone Encounter (Signed)
Checked cover my meds auth and this medication has been approved.

## 2021-03-19 NOTE — Patient Instructions (Signed)
Insomnia Insomnia is a sleep disorder that makes it difficult to fall asleep or stay asleep. Insomnia can cause fatigue, low energy, difficulty concentrating, moodswings, and poor performance at work or school. There are three different ways to classify insomnia: Difficulty falling asleep. Difficulty staying asleep. Waking up too early in the morning. Any type of insomnia can be long-term (chronic) or short-term (acute). Both are common. Short-term insomnia usually lasts for three months or less. Chronic insomnia occurs at least three times a week for longer than threemonths. What are the causes? Insomnia may be caused by another condition, situation, or substance, such as: Anxiety. Certain medicines. Gastroesophageal reflux disease (GERD) or other gastrointestinal conditions. Asthma or other breathing conditions. Restless legs syndrome, sleep apnea, or other sleep disorders. Chronic pain. Menopause. Stroke. Abuse of alcohol, tobacco, or illegal drugs. Mental health conditions, such as depression. Caffeine. Neurological disorders, such as Alzheimer's disease. An overactive thyroid (hyperthyroidism). Sometimes, the cause of insomnia may not be known. What increases the risk? Risk factors for insomnia include: Gender. Women are affected more often than men. Age. Insomnia is more common as you get older. Stress. Lack of exercise. Irregular work schedule or working night shifts. Traveling between different time zones. Certain medical and mental health conditions. What are the signs or symptoms? If you have insomnia, the main symptom is having trouble falling asleep or having trouble staying asleep. This may lead to other symptoms, such as: Feeling fatigued or having low energy. Feeling nervous about going to sleep. Not feeling rested in the morning. Having trouble concentrating. Feeling irritable, anxious, or depressed. How is this diagnosed? This condition may be diagnosed based  on: Your symptoms and medical history. Your health care provider may ask about: Your sleep habits. Any medical conditions you have. Your mental health. A physical exam. How is this treated? Treatment for insomnia depends on the cause. Treatment may focus on treating an underlying condition that is causing insomnia. Treatment may also include: Medicines to help you sleep. Counseling or therapy. Lifestyle adjustments to help you sleep better. Follow these instructions at home: Eating and drinking  Limit or avoid alcohol, caffeinated beverages, and cigarettes, especially close to bedtime. These can disrupt your sleep. Do not eat a large meal or eat spicy foods right before bedtime. This can lead to digestive discomfort that can make it hard for you to sleep.  Sleep habits  Keep a sleep diary to help you and your health care provider figure out what could be causing your insomnia. Write down: When you sleep. When you wake up during the night. How well you sleep. How rested you feel the next day. Any side effects of medicines you are taking. What you eat and drink. Make your bedroom a dark, comfortable place where it is easy to fall asleep. Put up shades or blackout curtains to block light from outside. Use a white noise machine to block noise. Keep the temperature cool. Limit screen use before bedtime. This includes: Watching TV. Using your smartphone, tablet, or computer. Stick to a routine that includes going to bed and waking up at the same times every day and night. This can help you fall asleep faster. Consider making a quiet activity, such as reading, part of your nighttime routine. Try to avoid taking naps during the day so that you sleep better at night. Get out of bed if you are still awake after 15 minutes of trying to sleep. Keep the lights down, but try reading or doing a quiet   activity. When you feel sleepy, go back to bed.  General instructions Take over-the-counter  and prescription medicines only as told by your health care provider. Exercise regularly, as told by your health care provider. Avoid exercise starting several hours before bedtime. Use relaxation techniques to manage stress. Ask your health care provider to suggest some techniques that may work well for you. These may include: Breathing exercises. Routines to release muscle tension. Visualizing peaceful scenes. Make sure that you drive carefully. Avoid driving if you feel very sleepy. Keep all follow-up visits as told by your health care provider. This is important. Contact a health care provider if: You are tired throughout the day. You have trouble in your daily routine due to sleepiness. You continue to have sleep problems, or your sleep problems get worse. Get help right away if: You have serious thoughts about hurting yourself or someone else. If you ever feel like you may hurt yourself or others, or have thoughts about taking your own life, get help right away. You can go to your nearest emergency department or call: Your local emergency services (911 in the [place] A suicide crisis helpline, such as the National Suicide Prevention Lifeline at 1-800-273-8255. This is open 24 hours a day. Summary Insomnia is a sleep disorder that makes it difficult to fall asleep or stay asleep. Insomnia can be long-term (chronic) or short-term (acute). Treatment for insomnia depends on the cause. Treatment may focus on treating an underlying condition that is causing insomnia. Keep a sleep diary to help you and your health care provider figure out what could be causing your insomnia. This information is not intended to replace advice given to you by your health care provider. Make sure you discuss any questions you have with your healthcare provider. Document Revised: 05/01/2020 Document Reviewed: 05/01/2020 Elsevier Patient Education  2022 Elsevier Inc.  

## 2021-03-19 NOTE — Progress Notes (Signed)
Subjective:  Patient ID: Todd Mccoy, male    DOB: 1970/09/04  Age: 50 y.o. MRN: [id number]  CC: Hospitalization Follow-up and Congestive Heart Failure   HPI Todd Mccoy is a 50 y.o. 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 He presents for a [facility] visit, He was hospitalized 8/26-9//2/22 at [facility] for [place] secondary to new diagnosis of CHF. He had presented with dyspnea and found to have a BNP of 4500, CXR revealed pulmonary edema. Treated with IV diuretics and Metolazone with improvement. Followed by Nephrology and Cardiology Echo revealed: IMPRESSIONS     1. Left ventricular ejection fraction, by estimation, is 40%. The left  ventricle has moderately decreased function. The left ventricle  demonstrates global hypokinesis. There is moderate-severe left ventricular  hypertrophy. Left ventricular diastolic  parameters are consistent with Grade III diastolic dysfunction  (restrictive).   2. Right ventricular systolic function is moderately reduced. The right  ventricular size is mildly enlarged. Moderately increased right  ventricular wall thickness. There is moderately elevated pulmonary artery  systolic pressure. The estimated right  ventricular systolic pressure is [id number] mmHg.   3. Left atrial size was moderately dilated.   4. A small pericardial effusion is present. The pericardial effusion is  circumferential. There is no evidence of cardiac tamponade. Large pleural  effusion.   5. The mitral valve is grossly normal. Trivial mitral valve  regurgitation. No evidence of mitral stenosis.   6. Tricuspid valve regurgitation is moderate.   7. The aortic valve is tricuspid. Aortic valve regurgitation is not  visualized. No aortic stenosis is present.   8. The inferior vena cava is normal in  size with <50% respiratory  variability, suggesting right atrial pressure of 8 mmHg.   Interval History: He states he feels a lot better Has been to see his Nephrologist but not Cardiology. He was told he would need a Cardiology referral.  BP is elevated and he states he took his antihypertensive today. Denies presence of pedal edema. He has no dyspnea or chest pain Complains of insomnia and denies caffeine intake; also endorses presence of  Anxiety.  Past Medical History:  Diagnosis Date   Anemia    Anemia    Anxiety    CKD (chronic kidney disease), stage IV ([place]) 12/10/2020   Diabetes mellitus with complication (HCC)    Diabetic neuropathy (HCC)    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[place]) 03/25/2020   pt states retinal eye damage- left worse than the right- recent visited MD    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[place]) 03/25/2020   pt states recent MD visit /left eye worse than right   Dyslipidemia 12/10/2020   Hx of BKA, left ([place])    Hypertension    Morbid obesity ([place])    Osteomyelitis ([place])    Pneumonia     Past Surgical History:  Procedure Laterality Date   ABSCESS DRAINAGE     neck   AMPUTATION Left 11/14/2020   Procedure: LEFT 5TH RAY AMPUTATION;  Surgeon: Newt Minion, MD;  Location: [facility];  Service: Orthopedics;  Laterality: Left;   AMPUTATION Left 12/10/2020   Procedure: LEFT BELOW KNEE AMPUTATION;  Surgeon: Newt Minion, MD;  Location: [facility];  Service: Orthopedics;  Laterality: Left;   AMPUTATION TOE Right 03/25/2020   Procedure: AMPUTATION TOE;  Surgeon:  Evelina Bucy, DPM;  Location: [facility];  Service: Podiatry;  Laterality: Right;   INCISION AND DRAINAGE ABSCESS Right 09/07/2014   Procedure: INCISION AND DRAINAGE ABSCESS RIGHT FLANK;  Surgeon: Jackolyn Confer, MD;  Location: [facility];  Service: General;  Laterality: Right;   LEG AMPUTATION BELOW KNEE Left 12/10/2020   SCROTUM EXPLORATION      Family History  Problem Relation Age of Onset   Diabetes  Mother    Diabetes Father    Diabetes Brother    Colon cancer Neg Hx    Esophageal cancer Neg Hx    Pancreatic cancer Neg Hx    Stomach cancer Neg Hx    Liver disease Neg Hx    CAD Neg Hx     Allergies  Allergen Reactions   Amlodipine Swelling    BLE edema    Outpatient Medications Prior to Visit  Medication Sig Dispense Refill   acetaminophen (TYLENOL) 325 MG tablet Take 650 mg by mouth every 6 (six) hours as needed for mild pain.     atorvastatin (LIPITOR) 20 MG tablet Take 1 tablet (20 mg total) by mouth daily. 30 tablet 3   carvedilol (COREG) 25 MG tablet Take 1 tablet (25 mg total) by mouth 2 (two) times daily with a meal. 60 tablet 3   ferrous sulfate 325 (65 FE) MG tablet Take 1 tablet (325 mg total) by mouth 3 (three) times daily with meals. (Patient taking differently: Take 325 mg by mouth in the morning and at bedtime.) 90 tablet 2   furosemide (LASIX) 80 MG tablet Take 2 tablets (160 mg total) by mouth in the morning, at noon, and at bedtime. 180 tablet 1   hydrALAZINE (APRESOLINE) 100 MG tablet Take 1 tablet (100 mg total) by mouth 3 (three) times daily. 90 tablet 1   insulin glargine (LANTUS) 100 UNIT/ML Solostar Pen Inject 10 Units into the skin at bedtime. 9 mL 3   Insulin Pen Needle 32G X 4 MM MISC USE TO INJECT INSULIN DAILY. 100 each 3   metolazone (ZAROXOLYN) 5 MG tablet Take 1 tablet (5 mg total) by mouth 2 (two) times daily. 60 tablet 1   Naphazoline HCl (CLEAR EYES OP) Place 1 drop into both eyes as needed (irritation).     oxyCODONE (OXY IR/ROXICODONE) 5 MG immediate release tablet Take 1 tablet (5 mg total) by mouth every 6 (six) hours as needed for moderate pain (pain score 4-6). 30 tablet 0   polyethylene glycol powder (GLYCOLAX/MIRALAX) 17 GM/SCOOP powder Take 17 g by mouth 2 (two) times daily as needed. 3350 g 1   potassium chloride (KLOR-CON) 10 MEQ tablet Take 1 tablet (10 mEq total) by mouth 2 (two) times daily. 60 tablet 1   pregabalin (LYRICA) 25 MG  capsule Take 1 capsule (25 mg total) by mouth 2 (two) times daily. 60 capsule 0   isosorbide mononitrate (IMDUR) 30 MG 24 hr tablet Take 1 tablet (30 mg total) by mouth daily. 30 tablet 3   No facility-administered medications prior to visit.     ROS Review of Systems  Constitutional:  Negative for activity change and appetite change.  HENT:  Negative for sinus pressure and sore throat.   Eyes:  Negative for visual disturbance.  Respiratory:  Negative for cough, chest tightness and shortness of breath.   Cardiovascular:  Negative for chest pain and leg swelling.  Gastrointestinal:  Negative for abdominal distention, abdominal pain, constipation and diarrhea.  Endocrine: Negative.   Genitourinary:  Negative for dysuria.  Musculoskeletal:  Negative for joint swelling and myalgias.  Skin:  Negative for rash.  Allergic/Immunologic: Negative.   Neurological:  Negative for weakness, light-headedness and numbness.  Psychiatric/Behavioral:  Negative for dysphoric mood and suicidal ideas.    Objective:  BP (!) 176/113   Pulse 99   SpO2 98%   BP/Weight 03/19/2021 03/06/2021 [id number]  Systolic BP 0000000 [id number] 0000000  Diastolic BP 123456 84 [id number]  Wt. (Lbs) - 258.6 -  BMI - 37.11 -      Physical Exam Constitutional:      Appearance: He is well-developed.  Cardiovascular:     Rate and Rhythm: Normal rate.     Heart sounds: Normal heart sounds. No murmur heard. Pulmonary:     Effort: Pulmonary effort is normal.     Breath sounds: Normal breath sounds. No wheezing or rales.  Chest:     Chest wall: No tenderness.  Abdominal:     General: Bowel sounds are normal. There is no distension.     Palpations: Abdomen is soft. There is no mass.     Tenderness: There is no abdominal tenderness.  Musculoskeletal:     Right lower leg: No edema.     Comments: L BKA  Neurological:     Mental Status: He is alert and oriented to person, place, and time.  Psychiatric:        Mood and Affect: Mood normal.     CMP Latest Ref Rng & Units 03/06/2021 03/05/2021 03/04/2021  Glucose 70 - 99 mg/dL 204(H) 166(H) 209(H)  BUN 6 - 20 mg/dL 60(H) 59(H) 61(H)  Creatinine 0.61 - 1.24 mg/dL 2.75(H) 2.57(H) 2.78(H)  Sodium 135 - 145 mmol/L 138 140 144  Potassium 3.5 - 5.1 mmol/L 4.1 3.8 4.1  Chloride 98 - 111 mmol/L 104 106 110  CO2 22 - 32 mmol/L '25 25 26  '$ Calcium 8.9 - 10.3 mg/dL 8.1(L) 8.0(L) 8.3(L)  Total Protein 6.5 - 8.1 g/dL - - -  Total Bilirubin 0.3 - 1.2 mg/dL - - -  Alkaline Phos 38 - 126 U/L - - -  AST 15 - 41 U/L - - -  ALT 0 - 44 U/L - - -    Lipid Panel     Component Value Date/Time   CHOL 132 05/23/2020 1144   TRIG 60 05/23/2020 1144   HDL 42 05/23/2020 1144   CHOLHDL 3.1 05/23/2020 1144   LDLCALC 77 05/23/2020 1144    CBC    Component Value Date/Time   WBC 6.8 03/05/2021 0456   RBC 3.15 (L) 03/05/2021 0456   HGB 8.8 (L) 03/05/2021 0456   HGB 8.4 (L) 04/17/2020 1716   HCT 28.9 (L) 03/05/2021 0456   HCT 26.3 (L) 04/17/2020 1716   PLT 241 03/05/2021 0456   PLT 197 04/17/2020 1716   MCV 91.7 03/05/2021 0456   MCV 85 04/17/2020 1716   MCH 27.9 03/05/2021 0456   MCHC 30.4 03/05/2021 0456   RDW 17.8 (H) 03/05/2021 0456   RDW 14.8 04/17/2020 1716   LYMPHSABS 0.8 02/27/2021 0622   LYMPHSABS 1.9 04/17/2020 1716   MONOABS 0.6 02/27/2021 0622   EOSABS 0.1 02/27/2021 0622   EOSABS 0.1 04/17/2020 1716   BASOSABS 0.0 02/27/2021 0622   BASOSABS 0.0 04/17/2020 1716    Lab Results  Component Value Date   HGBA1C 7.2 (H) 02/27/2021    Assessment & Plan:  1. Hypertension associated with stage 4 chronic kidney disease due to type 2 diabetes mellitus (  [place]) Uncontrolled Increased Isosorbide dose Continue Coreg, Hydralazine Counseled on blood pressure goal of less than 130/80, low-sodium, DASH diet, medication compliance, 150 minutes of moderate intensity exercise per week. Discussed medication compliance, adverse effects. - isosorbide mononitrate (IMDUR) 60 MG 24 hr tablet; Take  1 tablet (60 mg total) by mouth daily.  Dispense: 30 tablet; Refill: 3  2. Other insomnia Sleep hygiene discussed Initiate Hydroxyzine in the light of Anxiety as well - hydrOXYzine (ATARAX/VISTARIL) 25 MG tablet; Take 1 tablet (25 mg total) by mouth at bedtime as needed.  Dispense: 30 tablet; Refill: 1  3. Anxiety Placed on Hydroxyzine and if uncontrolled consider SSRI  4. Acute combined systolic and diastolic congestive heart failure (HCC) EF 40% Euvolemic Might benefit from addition of Entresto but will defer to Cardiology - Ambulatory referral to Cardiology  5. 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 complication, unspecified whether long term insulin use (HCC) Controlled with A1c of 7.2 Continue current medications Counseled on Diabetic diet, my plate method, [id number] minutes of moderate intensity exercise/week Blood sugar logs with fasting goals of 80-120 mg/dl, random of less than 180 and in the event of sugars less than 60 mg/dl or greater than 400 mg/dl encouraged to notify the clinic. Advised on the need for annual eye exams, annual foot exams, Pneumonia vaccine.     Meds ordered this encounter  Medications   isosorbide mononitrate (IMDUR) 60 MG 24 hr tablet    Sig: Take 1 tablet (60 mg total) by mouth daily.    Dispense:  30 tablet    Refill:  3    Dose increase   hydrOXYzine (ATARAX/VISTARIL) 25 MG tablet    Sig: Take 1 tablet (25 mg total) by mouth at bedtime as needed.    Dispense:  30 tablet    Refill:  1    Follow-up: Return in about 3 months (around 06/18/2021) for Medical conditions.       [name], MD, FAAFP. [facility] and [place], [place]   03/19/2021, 6:38 PM

## 2021-03-26 DIAGNOSIS — H3582 Retinal ischemia: Secondary | ICD-10-CM | POA: Diagnosis not present

## 2021-03-26 DIAGNOSIS — H35341 Macular cyst, hole, or pseudohole, right eye: Secondary | ICD-10-CM | POA: Diagnosis not present

## 2021-03-26 DIAGNOSIS — T85398D Other mechanical complication of other ocular prosthetic devices, implants and grafts, subsequent encounter: Secondary | ICD-10-CM | POA: Diagnosis not present

## 2021-03-26 DIAGNOSIS — E113521 Type 2 diabetes mellitus with proliferative diabetic retinopathy with traction retinal detachment involving the macula, right eye: Secondary | ICD-10-CM | POA: Diagnosis not present

## 2021-04-04 DIAGNOSIS — Z419 Encounter for procedure for purposes other than remedying health state, unspecified: Secondary | ICD-10-CM | POA: Diagnosis not present

## 2021-04-07 ENCOUNTER — Other Ambulatory Visit: Payer: Self-pay

## 2021-04-07 ENCOUNTER — Other Ambulatory Visit: Payer: Self-pay | Admitting: Cardiology

## 2021-04-07 ENCOUNTER — Encounter: Payer: Self-pay | Admitting: Cardiology

## 2021-04-07 ENCOUNTER — Telehealth: Payer: Self-pay | Admitting: *Deleted

## 2021-04-07 ENCOUNTER — Ambulatory Visit (INDEPENDENT_AMBULATORY_CARE_PROVIDER_SITE_OTHER): Payer: Medicaid Other | Admitting: Cardiology

## 2021-04-07 ENCOUNTER — Ambulatory Visit (INDEPENDENT_AMBULATORY_CARE_PROVIDER_SITE_OTHER): Payer: Medicaid Other

## 2021-04-07 VITALS — BP 142/90 | HR 102 | Ht 70.0 in | Wt 180.0 lb

## 2021-04-07 DIAGNOSIS — I5042 Chronic combined systolic (congestive) and diastolic (congestive) heart failure: Secondary | ICD-10-CM

## 2021-04-07 DIAGNOSIS — R072 Precordial pain: Secondary | ICD-10-CM | POA: Diagnosis not present

## 2021-04-07 DIAGNOSIS — R000 Tachycardia, unspecified: Secondary | ICD-10-CM

## 2021-04-07 DIAGNOSIS — I10 Essential (primary) hypertension: Secondary | ICD-10-CM

## 2021-04-07 DIAGNOSIS — N184 Chronic kidney disease, stage 4 (severe): Secondary | ICD-10-CM | POA: Diagnosis not present

## 2021-04-07 MED ORDER — AMLODIPINE BESYLATE 5 MG PO TABS: 5.0000 mg | ORAL_TABLET | Freq: Every day | ORAL | 3 refills | Status: DC

## 2021-04-07 NOTE — Patient Instructions (Signed)
Medication Instructions:  Your physician has recommended you make the following change in your medication: 1) START taking amlodipine 5 mg daily *If you need a refill on your cardiac medications before your next appointment, please call your pharmacy*  Testing/Procedures: Your physician has requested that you have a lexiscan myoview. For further information please visit [email or web address]. Please follow instruction sheet, as given.  Your physician has recommended that you have a sleep study. This test records several body functions during sleep, including: brain activity, eye movement, oxygen and carbon dioxide blood levels, heart rate and rhythm, breathing rate and rhythm, the flow of air through your mouth and nose, snoring, body muscle movements, and chest and belly movement.  Your physician has recommended that you wear an event monitor. Event monitors are medical devices that record the heart's electrical activity. Doctors most often [place] these monitors to diagnose arrhythmias. Arrhythmias are problems with the speed or rhythm of the heartbeat. The monitor is a small, portable device. You can wear one while you do your normal daily activities. This is usually used to diagnose what is causing palpitations/syncope (passing out).  Follow-Up: At [facility], you and your health needs are our priority.  As part of our continuing mission to provide you with exceptional heart care, we have created designated Provider Care Teams.  These Care Teams include your primary Cardiologist (physician) and Advanced Practice Providers (APPs -  Physician Assistants and Nurse Practitioners) who all work together to provide you with the care you need, when you need it.  Your next appointment:   6 month(s)  The format for your next appointment:   In Person  Provider:   You may see Fransico Him, MD or one of the following Advanced Practice Providers on your designated Care Team:   Melina Copa, PA-C Ermalinda Barrios, PA-C   Other Instructions You have been referred to see PharmD in the hypertension clinic in 1-2 weeks.   ZIO XT- Long Term Monitor Instructions  Your physician has requested you wear a ZIO patch monitor for 3 days.  This is a single patch monitor. [employer] supplies one patch monitor per enrollment. Additional stickers are not available. Please do not apply patch if you will be having a Nuclear Stress Test,  Echocardiogram, Cardiac CT, MRI, or Chest Xray during the period you would be wearing the  monitor. The patch cannot be worn during these tests. You cannot remove and re-apply the  ZIO XT patch monitor.  Your ZIO patch monitor will be mailed 3 day USPS to your address on file. It may take 3-5 days  to receive your monitor after you have been enrolled.  Once you have received your monitor, please review the enclosed instructions. Your monitor  has already been registered assigning a specific monitor serial # to you.  Billing and Patient Assistance Program Information  We have supplied [employer] with any of your insurance information on file for billing purposes. [employer] offers a sliding scale Patient Assistance Program for patients that do not have  insurance, or whose insurance does not completely cover the cost of the ZIO monitor.  You must apply for the Patient Assistance Program to qualify for this discounted rate.  To apply, please call [employer] at [phone], select option 4, select option 2, ask to apply for  Patient Assistance Program. Todd Mccoy will ask your household income, and how many people  are in your household. They will quote your out-of-pocket cost based on that information.  [employer] will also be  able to set up a 109-month interest-free payment plan if needed.  Applying the monitor   Shave hair from upper left chest.  Hold abrader disc by orange tab. Rub abrader in 40 strokes over the upper left chest as  indicated in your monitor instructions.  Clean area  with 4 enclosed alcohol pads. Let dry.  Apply patch as indicated in monitor instructions. Patch will be placed under collarbone on left  side of chest with arrow pointing upward.  Rub patch adhesive wings for 2 minutes. Remove white label marked "1". Remove the white  label marked "2". Rub patch adhesive wings for 2 additional minutes.  While looking in a mirror, press and release button in center of patch. A small green light will  flash 3-4 times. This will be your only indicator that the monitor has been turned on.  Do not shower for the first 24 hours. You may shower after the first 24 hours.  Press the button if you feel a symptom. You will hear a small click. Record Date, Time and  Symptom in the Patient Logbook.  When you are ready to remove the patch, follow instructions on the last 2 pages of Patient  Logbook. Stick patch monitor onto the last page of Patient Logbook.  Place Patient Logbook in the blue and white box. Use locking tab on box and tape box closed  securely. The blue and white box has prepaid postage on it. Please place it in the mailbox as  soon as possible. Your physician should have your test results approximately 7 days after the  monitor has been [facility]  Call [place]at [phone] you have questions regarding  your ZIO XT patch monitor. Call them immediately if you see an orange light blinking on your  monitor.  If your monitor falls off in less than 4 days, contact our Monitor department at [phone]  If your monitor becomes loose or falls off after 4 days call [employer] at [phone]  suggestions on securing your monitor

## 2021-04-07 NOTE — Telephone Encounter (Signed)
[employer]  PA received for itamar sleep study. Auth # [id number]. Valid dates 04/07/21 to 07/06/21. Staff message sent to Julaine Hua with approval information.

## 2021-04-07 NOTE — Progress Notes (Unsigned)
Patient enrolled for [employer] to mail a 3 day ZIO XT monitor to his address on file.

## 2021-04-07 NOTE — Telephone Encounter (Signed)
-----   Message from Jeanann Lewandowsky, [place] sent at 04/07/2021  1:48 PM EDT ----- Regarding: [place]

## 2021-04-07 NOTE — Progress Notes (Signed)
Cardiology Office Note:    Date:  04/07/2021   ID:  Todd Mccoy, DOB [birth year], MRN [id number]  PCP:  [name], MD  Cardiologist:  Fransico Him, MD    Referring MD: [name], MD   Chief Complaint  Patient presents with   Congestive Heart Failure   Hypertension     History of Present Illness:    Todd Mccoy is a 50 y.o. 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  He was admitted with anasarca as well as acute combined systolic/diastolic CHF and treated with high dose IV diuretics and metolazone.  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  He had accelerated HTN during his hospitalization and his BP improved with diuresis.  2D echo showed EF 40% with G3DD.  He is on Carvedilol, Imdur and Hydralazine.  He was discharged home on lasix '160mg'$  TID and Metolazone '5mg'$  BID.  He is here today for followup and is doing well.  He denies any chest pain or pressure, SOB, DOE, PND, orthopnea, LE edema, dizziness, palpitations or syncope. He is compliant with his meds and is tolerating meds with no SE.     Past Medical History:  Diagnosis Date   Anemia    Anemia    Anxiety    Chronic combined systolic (congestive) and diastolic (congestive) heart failure (HCC)    EF 40-45% with G3DD on echo 2022   CKD (chronic kidney disease), stage IV ([place]) 12/10/2020   Diabetes mellitus with complication (HCC)    Diabetic neuropathy ([place])    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[place]) 03/25/2020   pt states retinal eye damage- left worse than the right- recent visited MD    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[place])  03/25/2020   pt states recent MD visit /left eye worse than right   Dyslipidemia 12/10/2020   Hx of BKA, left ([place])    Hypertension    Morbid obesity ([place])    Osteomyelitis ([place])    Pneumonia     Past Surgical History:  Procedure Laterality Date   ABSCESS DRAINAGE     neck   AMPUTATION Left 11/14/2020   Procedure: LEFT 5TH RAY AMPUTATION;  Surgeon: Newt Minion, MD;  Location: [facility];  Service: Orthopedics;  Laterality: Left;   AMPUTATION Left 12/10/2020   Procedure: LEFT BELOW KNEE AMPUTATION;  Surgeon: Newt Minion, MD;  Location: [facility];  Service: Orthopedics;  Laterality: Left;   AMPUTATION TOE Right 03/25/2020   Procedure: AMPUTATION TOE;  Surgeon: Evelina Bucy, DPM;  [facility];  Service: Podiatry;  Laterality: Right;   INCISION AND DRAINAGE ABSCESS Right 09/07/2014   Procedure: INCISION AND DRAINAGE ABSCESS RIGHT FLANK;  Surgeon: Jackolyn Confer, MD;  Location[facility];  Service: General;  Laterality: Right;   LEG AMPUTATION BELOW KNEE Left 12/10/2020   SCROTUM EXPLORATION      Current Medications: Current Meds  Medication Sig   acetaminophen (TYLENOL) 325 MG tablet Take 650 mg by mouth every 6 (six) hours as needed for mild pain.   atorvastatin (LIPITOR) 20 MG tablet Take 1 tablet (20 mg total) by mouth daily.  carvedilol (COREG) 25 MG tablet Take 1 tablet (25 mg total) by mouth 2 (two) times daily with a meal.   ferrous sulfate 325 (65 FE) MG tablet Take 1 tablet (325 mg total) by mouth 3 (three) times daily with meals. (Patient taking differently: Take 325 mg by mouth in the morning and at bedtime.)   furosemide (LASIX) 80 MG tablet Take 2 tablets (160 mg total) by mouth in the morning, at noon, and at bedtime.   hydrALAZINE (APRESOLINE) 100 MG tablet Take 1 tablet (100 mg total) by mouth 3 (three) times daily.   hydrOXYzine (ATARAX/VISTARIL) 25 MG tablet Take 1 tablet (25 mg total) by mouth at bedtime as needed.   insulin glargine (LANTUS) 100 UNIT/ML Solostar Pen  Inject 10 Units into the skin at bedtime.   Insulin Pen Needle 32G X 4 MM MISC USE TO INJECT INSULIN DAILY.   isosorbide mononitrate (IMDUR) 60 MG 24 hr tablet Take 1 tablet (60 mg total) by mouth daily.   metolazone (ZAROXOLYN) 5 MG tablet Take 1 tablet (5 mg total) by mouth 2 (two) times daily.   Naphazoline HCl (CLEAR EYES OP) Place 1 drop into both eyes as needed (irritation).   oxyCODONE (OXY IR/ROXICODONE) 5 MG immediate release tablet Take 1 tablet (5 mg total) by mouth every 6 (six) hours as needed for moderate pain (pain score 4-6).   polyethylene glycol powder (GLYCOLAX/MIRALAX) 17 GM/SCOOP powder Take 17 g by mouth 2 (two) times daily as needed.   potassium chloride (KLOR-CON) 10 MEQ tablet Take 1 tablet (10 mEq total) by mouth 2 (two) times daily.   pregabalin (LYRICA) 25 MG capsule Take 1 capsule (25 mg total) by mouth 2 (two) times daily.   [DISCONTINUED] losartan-hydrochlorothiazide (HYZAAR) 100-25 MG tablet TAKE 1 TABLET BY MOUTH DAILY.     Allergies:   Amlodipine   Social History   Socioeconomic History   Marital status: Single    Spouse name: Not on file   Number of children: 1   Years of education: Not on file   Highest education level: Not on file  Occupational History   Not on file  Tobacco Use   Smoking status: Every Day    Packs/day: 1.00    Years: 32.00    Pack years: 32.00    Types: Cigarettes   Smokeless tobacco: Never   Tobacco comments:    Currently down to 1/2 ppd  Vaping Use   Vaping Use: Never used  Substance and Sexual Activity   Alcohol use: No   Drug use: No   Sexual activity: Not on file  Other Topics Concern   Not on file  Social History Narrative   Not on file   Social Determinants of Health   Financial Resource Strain: Not on file  Food Insecurity: Not on file  Transportation Needs: No Transportation Needs   Lack of Transportation (Medical): No   Lack of Transportation (Non-Medical): No  Physical Activity: Not on file  Stress:  Not on file  Social Connections: Not on file     Family History: The patient's family history includes Diabetes in his brother, father, and mother. There is no history of Colon cancer, Esophageal cancer, Pancreatic cancer, Stomach cancer, Liver disease, or CAD.  ROS:   Please see the history of present illness.    ROS  All other systems reviewed and negative.   EKGs/Labs/Other Studies Reviewed:    The following studies were reviewed today: 2D echo, EKG  EKG:  EKG is  ordered today and demonstrates   Recent Labs: 02/27/2021: ALT 32; B Natriuretic Peptide >4,500.0; TSH 2.455 02/28/2021: Magnesium 1.9 03/05/2021: Hemoglobin 8.8; Platelets 241 03/06/2021: BUN 60; Creatinine, Ser 2.75; Potassium 4.1; Sodium 138   Recent Lipid Panel    Component Value Date/Time   CHOL 132 05/23/2020 1144   TRIG 60 05/23/2020 1144   HDL 42 05/23/2020 1144   CHOLHDL 3.1 05/23/2020 1144   LDLCALC 77 05/23/2020 1144    Physical Exam:    VS:  BP (!) 142/90   Pulse (!) 102   Ht '5\' 10"'$  (1.778 m)   Wt 180 lb (81.6 kg) Comment: patient stated weight could not weigh  SpO2 99%   BMI 25.83 kg/m     Wt Readings from Last 3 Encounters:  04/07/21 180 lb (81.6 kg)  03/06/21 258 lb 9.6 oz (117.3 kg)  02/11/21 174 lb 2.6 oz (79 kg)     GEN:  Well nourished, well developed in no acute distress HEENT: Normal NECK: No JVD; No carotid bruits LYMPHATICS: No lymphadenopathy CARDIAC: RRR, no murmurs, rubs, gallops RESPIRATORY:  faint crackles at bases ABDOMEN: Soft, non-tender, non-distended MUSCULOSKELETAL:  No edema; left AKA SKIN: Warm and dry NEUROLOGIC:  Alert and oriented x 3 PSYCHIATRIC:  Normal affect   ASSESSMENT:    1. Chronic combined systolic (congestive) and diastolic (congestive) heart failure ([place])   2. Primary hypertension   3. 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 leading to anasarca and required albumin infusions to assist with diuresis and exacerbation from hypertensive urgency -he is now followed by nephrology -he has a few crackles at his lung bases but no RLE edema -nephrology is managing his diuretics -check BMET and BNP today -continue Carvedilol '25mg'$  BID, Hydralazine '100mg'$  TID, Imdur '60mg'$  daily, Lasix '80mg'$  TID, Hyzaar 100-'25mg'$  daily and Zaroxolyn '5mg'$  BID -no spiro due to CKD 4 -no SGLT2i due to IDDM -suspect his LV dysfunction is 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 -cannot get coronary CTA to rule out CAD due to CKD -will get Lexiscan myoview to rule out ischemia -Shared Decision Making/Informed Consent The risks [chest pain, shortness of breath, cardiac arrhythmias, dizziness, blood pressure fluctuations, myocardial infarction, stroke/transient ischemic attack, nausea, vomiting, allergic reaction, radiation exposure, metallic taste sensation and life-threatening complications (estimated to be 1 in 10,000)], benefits (risk stratification, diagnosing coronary artery disease, treatment guidance) and alternatives of a nuclear stress test were discussed in detail with Mr. Kulczyk and he agrees to proceed.  2.  HTN -BP borderline controlled on exam today -will add amlodipine '5mg'$  daily -BP goal < 130/35mHg -continue prescription drug management with Hydralazine '100mg'$  TID, Imdur '60mg'$  daily, Carvedilol '25mg'$  BID -followup with PharmD in 1 week for uptitration of BP meds -will get  home sleep study to rule out OSA given morbid obesity  3.  CKD stage 4 -followed by nephrology -diuretics management by renal  4.  Sinus tachycardia -HR in the 100's today and he says he is compliant with his BB -will get 3 day ziopatch to assess HR and if elevated consistently then would change Carvedilol to Toprol   Time  Spent: 25 minutes total time of encounter, including 15 minutes spent in face-to-face patient care on the date of this encounter. This time includes coordination of  care and counseling regarding above mentioned problem list. Remainder of non-face-to-face time involved reviewing chart documents/testing relevant to the patient encounter and documentation in the medical record. I have independently reviewed documentation from referring provider  Medication Adjustments/Labs and Tests Ordered: Current medicines are reviewed at length with the patient today.  Concerns regarding medicines are outlined above.  No orders of the defined types were placed in this encounter.  No orders of the defined types were placed in this encounter.   Signed, Fransico Him, MD  04/07/2021 12:11 PM    [place]

## 2021-04-08 ENCOUNTER — Other Ambulatory Visit (INDEPENDENT_AMBULATORY_CARE_PROVIDER_SITE_OTHER): Payer: Medicaid Other

## 2021-04-08 ENCOUNTER — Telehealth: Payer: Self-pay | Admitting: *Deleted

## 2021-04-08 DIAGNOSIS — R000 Tachycardia, unspecified: Secondary | ICD-10-CM | POA: Diagnosis not present

## 2021-04-08 NOTE — Telephone Encounter (Signed)
-----   Message from Lauralee Evener, [place] sent at 04/07/2021  3:01 PM EDT ----- Regarding: FW: [place] approved study. Auth # [id number]. Valid dates 04/07/21 to 07/06/21 ----- Message ----- From: Jeanann Lewandowsky, RMA Sent: 04/07/2021   1:49 PM EDT To: [facility] Subject: [facility] SLEEP STUDY                             PT NEEDS A [facility] SLEEP STUDY

## 2021-04-08 NOTE — Telephone Encounter (Signed)
Call placed to pt regarding [facility] Sleep Study.  Pt has been approved. Left a message for pt to call back so we can provide him with the pin # .

## 2021-04-09 NOTE — Telephone Encounter (Signed)
Left message for pt to call back so that we may provide him with the PIN # for the [facility] Sleep Study.

## 2021-04-09 NOTE — Telephone Encounter (Signed)
Todd Mccoy is returning Carol's call.

## 2021-04-09 NOTE — Telephone Encounter (Signed)
I s/w the pt and he has been given PIN # [id number] to proceed with [facility] Sleep Study. Pt aware once study has been read by the cardiologist, our office will call with the results and any recommendations. Pt will do sleep study sometime between today and this weekend. Pt thanked me for the call and the help.

## 2021-04-10 DIAGNOSIS — N2581 Secondary hyperparathyroidism of renal origin: Secondary | ICD-10-CM | POA: Diagnosis not present

## 2021-04-10 DIAGNOSIS — N184 Chronic kidney disease, stage 4 (severe): Secondary | ICD-10-CM | POA: Diagnosis not present

## 2021-04-10 DIAGNOSIS — E11319 Type 2 diabetes mellitus with unspecified diabetic retinopathy without macular edema: Secondary | ICD-10-CM | POA: Diagnosis not present

## 2021-04-10 DIAGNOSIS — R809 Proteinuria, unspecified: Secondary | ICD-10-CM | POA: Diagnosis not present

## 2021-04-10 DIAGNOSIS — N189 Chronic kidney disease, unspecified: Secondary | ICD-10-CM | POA: Diagnosis not present

## 2021-04-10 DIAGNOSIS — I129 Hypertensive chronic kidney disease with stage 1 through stage 4 chronic kidney disease, or unspecified chronic kidney disease: Secondary | ICD-10-CM | POA: Diagnosis not present

## 2021-04-10 DIAGNOSIS — E1122 Type 2 diabetes mellitus with diabetic chronic kidney disease: Secondary | ICD-10-CM | POA: Diagnosis not present

## 2021-04-13 NOTE — Addendum Note (Signed)
Addended by: [name] on: 04/13/2021 08:45 PM   Modules accepted: Orders

## 2021-04-13 NOTE — Addendum Note (Signed)
Addended by: Antonieta Iba on: 04/13/2021 02:35 PM   Modules accepted: Orders

## 2021-04-14 DIAGNOSIS — R000 Tachycardia, unspecified: Secondary | ICD-10-CM

## 2021-04-14 DIAGNOSIS — N184 Chronic kidney disease, stage 4 (severe): Secondary | ICD-10-CM | POA: Diagnosis not present

## 2021-04-14 DIAGNOSIS — I10 Essential (primary) hypertension: Secondary | ICD-10-CM

## 2021-04-14 DIAGNOSIS — R072 Precordial pain: Secondary | ICD-10-CM

## 2021-04-14 DIAGNOSIS — I5042 Chronic combined systolic (congestive) and diastolic (congestive) heart failure: Secondary | ICD-10-CM | POA: Diagnosis not present

## 2021-04-15 ENCOUNTER — Ambulatory Visit (INDEPENDENT_AMBULATORY_CARE_PROVIDER_SITE_OTHER): Payer: Medicaid Other | Admitting: Family

## 2021-04-15 ENCOUNTER — Encounter: Payer: Self-pay | Admitting: Family

## 2021-04-15 DIAGNOSIS — Z89512 Acquired absence of left leg below knee: Secondary | ICD-10-CM | POA: Diagnosis not present

## 2021-04-15 MED ORDER — PREGABALIN 50 MG PO CAPS: 50.0000 mg | ORAL_CAPSULE | Freq: Two times a day (BID) | ORAL | 0 refills | Status: DC

## 2021-04-15 MED ORDER — OXYCODONE HCL 5 MG PO TABS: 5.0000 mg | ORAL_TABLET | Freq: Three times a day (TID) | ORAL | 0 refills | Status: DC | PRN

## 2021-04-15 NOTE — Progress Notes (Signed)
Office Visit Note   Patient: Todd Mccoy           Date of Birth: 05-16-1971           MRN: [id number] Visit Date: 03/18/2021              Requested by: [name], MD [place],  [place] [address] PCP: [name], MD  Chief Complaint  Patient presents with   Left Knee - Follow-up      HPI: Patient is a pleasant 50 year old gentleman who is 4 months status post below-knee amputation.  He continues to complain of issues with phantom pain.  He has been taking Lyrica 25 mg at bedtime as well as oxycodone at bedtime  He states he has been casted for his prosthesis.   Assessment & Plan: Visit Diagnoses:  1. S/P BKA (below knee amputation) unilateral, left ([place])     Plan: He will continue following with [facility] for prosthesis set up.  He will continue with his Lyrica, will increase his dose to work on his phantom pain. He has failed treatment for his phantom pain with gabapentin.  Follow-up in 2 months.  Follow-Up Instructions: No follow-ups on file.   Ortho Exam  Patient is alert, oriented, no adenopathy, well-dressed, normal affect, normal respiratory effort. Left below-knee amputation is well-healed there is no gaping drainage no open ulceration no sign of infection.  This is consolidating well. Imaging: No results found. No images are attached to the encounter.  Labs: Lab Results  Component Value Date   HGBA1C 7.2 (H) 02/27/2021   HGBA1C 7.1 (A) 02/25/2021   HGBA1C 8.6 (H) 11/12/2020   ESRSEDRATE 134 (H) 12/10/2020   ESRSEDRATE 126 (H) 03/24/2020   CRP 29.8 (H) 12/10/2020   LABURIC 9.1 (H) 12/15/2020   REPTSTATUS 12/22/2020 FINAL 12/21/2020   GRAMSTAIN  11/07/2019    NO WBC SEEN ABUNDANT GRAM POSITIVE COCCI FEW GRAM POSITIVE RODS    CULT (A) 12/21/2020    30,000 COLONIES/mL MULTIPLE SPECIES PRESENT, SUGGEST RECOLLECTION   LABORGA STAPHYLOCOCCUS AUREUS 11/07/2019     Lab Results  Component Value Date   ALBUMIN 2.0 (L)  03/06/2021   ALBUMIN 2.0 (L) 03/05/2021   ALBUMIN 2.0 (L) 03/04/2021   PREALBUMIN 5.5 (L) 12/10/2020    Lab Results  Component Value Date   MG 1.9 02/28/2021   MG 1.8 02/27/2021   MG 2.0 12/14/2020   No results found for: VD25OH  Lab Results  Component Value Date   PREALBUMIN 5.5 (L) 12/10/2020   CBC EXTENDED Latest Ref Rng & Units 03/05/2021 03/03/2021 03/01/2021  WBC 4.0 - 10.5 K/uL 6.8 7.2 6.1  RBC 4.22 - 5.81 MIL/uL 3.15(L) 3.33(L) 3.03(L)  HGB 13.0 - 17.0 g/dL 8.8(L) 9.4(L) 8.5(L)  HCT 39.0 - 52.0 % 28.9(L) 31.3(L) 27.6(L)  PLT 150 - 400 K/uL 241 221 209  NEUTROABS 1.7 - 7.7 K/uL - - -  LYMPHSABS 0.7 - 4.0 K/uL - - -     There is no height or weight on file to calculate BMI.  Orders:  Orders Placed This Encounter  Procedures   Ambulatory referral to Physical Therapy   Meds ordered this encounter  Medications   oxyCODONE (OXY IR/ROXICODONE) 5 MG immediate release tablet    Sig: Take 1 tablet (5 mg total) by mouth every 6 (six) hours as needed for moderate pain (pain score 4-6).    Dispense:  30 tablet    Refill:  0   pregabalin (LYRICA)  25 MG capsule    Sig: Take 1 capsule (25 mg total) by mouth 2 (two) times daily.    Dispense:  60 capsule    Refill:  0     Procedures: No procedures performed  Clinical Data: No additional findings.  ROS:  All other systems negative, except as noted in the HPI. Review of Systems  Objective: Vital Signs: There were no vitals taken for this visit.  Specialty Comments:  No specialty comments available.  PMFS History: Patient Active Problem List   Diagnosis Date Noted   Chronic renal disease, stage 4, severely decreased glomerular filtration rate between 15-29 mL/min/1.73 square meter (HCC)    Hypertensive urgency    AKI (acute kidney injury) ([place])    Acute CHF (congestive heart failure) ([place]) 02/27/2021   Normocytic anemia 02/27/2021   Anasarca 02/27/2021   Abscess of left foot 12/10/2020   S/P BKA (below knee  amputation) unilateral, left ([place]) 12/10/2020   Dyslipidemia 12/10/2020   CKD (chronic kidney disease), stage III ([place]) 12/10/2020   Subacute osteomyelitis, left ankle and foot (HCC)    Osteomyelitis of fifth toe of left foot (HCC)    Severe protein-calorie malnutrition ([place])    HTN (hypertension) 11/12/2020   Osteomyelitis of second toe of right foot ([place])    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[place])    Cellulitis and abscess of foot    Toe osteomyelitis ([place]) 03/24/2020   Necrotizing fasciitis ([place]) 09/08/2014   Diabetes mellitus with hyperglycemia ([place]) 09/07/2014   Tobacco abuse 09/07/2014   Abscess of back    Abscess of lower back 09/06/2014   DM2 (diabetes mellitus, type 2) ([place]) 09/06/2014   Sepsis ([place]) 09/06/2014   Scrotal abscess 06/20/2014   Past Medical History:  Diagnosis Date   Anemia    Anemia    Anxiety    CKD (chronic kidney disease), stage IV ([place]) 12/10/2020   Diabetes mellitus with complication (HCC)    Diabetic neuropathy (HCC)    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[place]) 03/25/2020   pt states retinal eye damage- left worse than the right- recent visited MD    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[place]) 03/25/2020   pt states recent MD visit /left eye worse than right   Dyslipidemia 12/10/2020   Hx of BKA, left ([place])    Hypertension    Morbid obesity ([place])    Osteomyelitis ([place])    Pneumonia     Family History  Problem Relation Age of Onset   Diabetes Mother    Diabetes Father    Diabetes Brother    Colon cancer Neg Hx    Esophageal cancer Neg Hx    Pancreatic cancer Neg Hx    Stomach cancer Neg Hx    Liver disease Neg Hx    CAD Neg Hx     Past Surgical History:  Procedure Laterality Date   ABSCESS DRAINAGE     neck   AMPUTATION Left 11/14/2020   Procedure: LEFT 5TH RAY AMPUTATION;  Surgeon: Newt Minion, MD;  Location: [facility];  Service: Orthopedics;  Laterality: Left;   AMPUTATION Left 12/10/2020   Procedure: LEFT BELOW KNEE AMPUTATION;   Surgeon: Newt Minion, MD;  Location: [facility];  Service: Orthopedics;  Laterality: Left;   AMPUTATION TOE Right 03/25/2020   Procedure: AMPUTATION TOE;  Surgeon: Evelina Bucy, DPM;  [facility];  Service: Podiatry;  Laterality: Right;   INCISION AND DRAINAGE ABSCESS Right 09/07/2014   Procedure:  INCISION AND DRAINAGE ABSCESS RIGHT FLANK;  Surgeon: Jackolyn Confer, MD;  [facility];  Service: General;  Laterality: Right;   LEG AMPUTATION BELOW KNEE Left 12/10/2020   SCROTUM EXPLORATION     Social History   Occupational History   Not on file  Tobacco Use   Smoking status: Every Day    Packs/day: 1.00    Years: 32.00    Pack years: 32.00    Types: Cigarettes   Smokeless tobacco: Never   Tobacco comments:    Currently down to 1/2 ppd  Vaping Use   Vaping Use: Never used  Substance and Sexual Activity   Alcohol use: No   Drug use: No   Sexual activity: Not on file

## 2021-04-22 ENCOUNTER — Telehealth (HOSPITAL_COMMUNITY): Payer: Self-pay

## 2021-04-22 NOTE — Telephone Encounter (Signed)
Spoke with the patient, detailed instructions given. He stated that he would be here for his test. Asked to call back with any questions. S.Geana Walts EMTP 

## 2021-04-22 NOTE — Progress Notes (Signed)
Patient ID: Todd Mccoy                 DOB: 1971/04/27                      MRN: [id number]     HPI: Todd Mccoy is a 50 y.o. male referred by Dr. Radford Mccoy to pharmacy clinic for BP management after being seen on 10/4 for HF follow-up. PMH is significant for HTN, HLD, CKD stage 4, T2DM with nephropathy and retinopathy, CHF, tobacco abuse, left foot osteomyelitis post BKA in 12/2020, status post left second amputation, IDA. Patient uses a wheelchair and is legally blind. Most recent LVEF 40% on 02/27/21. Patient was recently admitted to ED on 8/26 with anasarca secondary to acute combined systolic/diastolic CHF where he was  treated with IV diuretics and metolazone. 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 His diuretics are being managed by nephrology. He was later seen at follow-up with Dr. Radford Mccoy where he was started on amlodipine 5 mg daily and referred to PharmD clinic for uptitration. He has been referred for sleep study to rule out OSA. Most recent GFR on 9/2 was 28.  Today, patient presents for med management. He states his BP at home runs in the 140s/80s and he usually checks in the evenings. Symptomatically, he reports no new shortness of breath and no chest pain. He denies dizziness, light-headedness and fatigue, and he has been tolerating his amlodipine well with no swelling.  He notes he did not experience angioedema with amlodipine (as referenced in note from [place] on 9/8) but only mild swelling in his legs from years ago. Patient also smokes 1/2 ppd currently. He has previously tried to use a vape pen to wean off cigarettes but this was unsuccessful.  There are a few discrepancies noted the patient's medication list. Pharmacy called as pt's HR has been > 100 despite being prescribed carvedilol, noncompliance suspected. Pharmacy has not filled  carvedilol or atorvastatin in over 4 months for pt. He also had Imdur '30mg'$  refilled recently despite PCP increasing dose to '60mg'$ . Pt reports [name] stopped his metolazone (we had '5mg'$  BID still listed) and decreased his Lasix to BID (dose unclear, previously taking '160mg'$  TID). Unable to see most recent note and pharmacy has not filled rx more recently than this.  Current CHF meds: amlodipine 5 mg daily (morning), isosorbide mononitrate 60 mg daily (morning), hydralazine 100 mg TID,  carvedilol 25 mg BID (has not been taking), furosemide 160 mg BID Other Meds: Klor-con 10 mEq BID Previously tried: losartan 100 mg daily(stopped at 8/26 visit), losartan-hctz 100-25 mg daily(stopped back in 6/22 visit), lisinopril 5 mg daily, furosemide 20 mg daily, metolazone 5 mg BID BP goal: <130/80  Family History: family history includes Diabetes in his brother, father, and mother.  Social History:  reports that he has been smoking cigarettes. He has a 32.00 pack-year smoking history. He has never used smokeless tobacco. He reports that he does not drink alcohol and does not use drugs. Half a pack a day . Has tried vape to wean off cigarettes with no success.   Home BP readings: 145/88 taken last evening  Wt Readings from Last 3 Encounters:  04/23/21 180 lb (81.6 kg)  04/07/21 180 lb (81.6 kg)  03/06/21 258 lb 9.6 oz (117.3 kg)   BP Readings from Last 3 Encounters:  04/23/21 138/70  04/07/21 (!) 142/90  03/19/21 (!) 176/113   Pulse Readings from Last 3 Encounters:  04/23/21 (!) 104  04/07/21 (!) 102  03/19/21 99    Renal function: CrCl cannot be calculated (Patient's most recent lab result is older than the maximum 21 days allowed.).  Past Medical History:  Diagnosis Date   Anemia    Anemia    Anxiety    Chronic combined systolic (congestive) and diastolic (congestive) heart failure (HCC)    EF 40-45% with G3DD on echo 2022   CKD (chronic kidney disease), stage IV ([place]) 12/10/2020    Diabetes mellitus with complication (HCC)    Diabetic neuropathy ([place])    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[place]) 03/25/2020   pt states retinal eye damage- left worse than the right- recent visited MD    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[place]) 03/25/2020   pt states recent MD visit /left eye worse than right   Dyslipidemia 12/10/2020   Hx of BKA, left ([place])    Hypertension    Morbid obesity ([place])    Osteomyelitis (HCC)    Pneumonia     Current Outpatient Medications on File Prior to Visit  Medication Sig Dispense Refill   acetaminophen (TYLENOL) 325 MG tablet Take 650 mg by mouth every 6 (six) hours as needed for mild pain.     amLODipine (NORVASC) 5 MG tablet Take 1 tablet (5 mg total) by mouth daily. 90 tablet 3   ferrous sulfate 325 (65 FE) MG tablet Take 1 tablet (325 mg total) by mouth 3 (three) times daily with meals. (Patient taking differently: Take 325 mg by mouth in the morning and at bedtime.) 90 tablet 2   furosemide (LASIX) 80 MG tablet Take 2 tablets (160 mg total) by mouth in the morning, at noon, and at bedtime. (Patient taking differently: Take 160 mg by mouth 2 (two) times daily.) 180 tablet 1   hydrALAZINE (APRESOLINE) 100 MG tablet Take 1 tablet (100 mg total) by mouth 3 (three) times daily. 90 tablet 1   hydrOXYzine (ATARAX/VISTARIL) 25 MG tablet Take 1 tablet (25 mg total) by mouth at bedtime as needed. 30 tablet 1   insulin glargine (LANTUS) 100 UNIT/ML Solostar Pen Inject 10 Units into the skin at bedtime. 9 mL 3   Insulin Pen Needle 32G X 4 MM MISC USE TO INJECT INSULIN DAILY. 100 each 3   isosorbide mononitrate (IMDUR) 60 MG 24 hr tablet Take 1 tablet (60 mg total) by mouth daily. (Patient taking differently: Take 60 mg by mouth daily. Pt still taking '30mg'$  daily) 30 tablet 3   Naphazoline HCl (CLEAR EYES OP) Place 1 drop into both eyes as needed (irritation).     oxyCODONE (OXY IR/ROXICODONE) 5 MG immediate release tablet Take 1 tablet (5 mg total) by mouth  every 8 (eight) hours as needed for moderate pain (pain score 4-6). 30 tablet 0   polyethylene glycol powder (GLYCOLAX/MIRALAX) 17 GM/SCOOP powder Take 17 g by mouth 2 (two) times daily as needed. 3350 g 1   potassium chloride (KLOR-CON) 10 MEQ tablet Take 1 tablet (10 mEq total) by mouth 2 (two) times daily. 60 tablet 1   pregabalin (LYRICA) 25 MG capsule Take 1 capsule (25 mg total) by mouth 2 (two) times daily. 60 capsule 0   pregabalin (LYRICA) 50 MG capsule Take 1 capsule (50 mg total) by mouth 2 (two) times daily. 60 capsule 0   [DISCONTINUED] insulin aspart protamine- aspart (NOVOLOG  MIX 70/30) (70-30) 100 UNIT/ML injection Inject 0.35 mLs (35 Units total) into the skin 2 (two) times daily. 60 mL 3   [DISCONTINUED] losartan-hydrochlorothiazide (HYZAAR) 100-25 MG tablet TAKE 1 TABLET BY MOUTH DAILY. 30 tablet 6   [DISCONTINUED] metFORMIN (GLUCOPHAGE-XR) 500 MG 24 hr tablet TAKE 1 TABLET (500 MG TOTAL) BY MOUTH 2 (TWO) TIMES DAILY. 60 tablet 2   No current facility-administered medications on file prior to visit.    Allergies  Allergen Reactions   Amlodipine Swelling    BLE edema     Assessment/Plan:  1. CHF/HTN -  Patient's BP is above goal of <130/80 mm Hg. His HR is also elevated in office. Will re-start carvedilol 12.5 mg BID as it was discovered today that pt has not been taking for multiple months ('25mg'$  BID dose on his med list). Continue amlodipine 5 mg daily, isosorbide mononitrate 60 mg daily, hydralazine 100 mg TID, furosemide 160 mg BID. Stage 4 CKD limiting GDMT for CHF as pt is unable to start Entresto, SGLT2i, or spironolactone. Will follow-up in 4 weeks to assess blood pressure and HR and titrate carvedilol dose as able. Will call patient beforehand and ask him to bring all his medications in for review at follow-up visit as there are some discrepancies still noted with his Imdur and Lasix dosing.  2. Tobacco use - Counseled patient on smoking cessation and recommended he  try nicotine gum or lozenge from his local pharmacy. He currently smokes 8-9 cigarettes/day. He was encouraged to work towards goal of 4-5/day by next visit.. Counseled patient to continue checking blood pressure at home and to limit caffeine and sodium intake.  3. HLD - Discovered today that pt has not been taking atorvastatin for many months. Refill sent in of atorvastatin 20 mg daily for pt to resume.    Cyd Silence  Pharm D. Candidate  [facility]. Supple, PharmD, BCACP, [place] [id number] [place], [place] [address] Phone: [phone]; Fax: [phone] 04/23/2021 1:24 PM

## 2021-04-23 ENCOUNTER — Ambulatory Visit (HOSPITAL_COMMUNITY): Payer: Medicaid Other | Attending: Internal Medicine

## 2021-04-23 ENCOUNTER — Ambulatory Visit (INDEPENDENT_AMBULATORY_CARE_PROVIDER_SITE_OTHER): Payer: Medicaid Other | Admitting: Pharmacist

## 2021-04-23 ENCOUNTER — Other Ambulatory Visit: Payer: Self-pay

## 2021-04-23 VITALS — BP 138/70 | HR 104

## 2021-04-23 DIAGNOSIS — E11628 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 complications: Secondary | ICD-10-CM | POA: Diagnosis not present

## 2021-04-23 DIAGNOSIS — I5042 Chronic combined systolic (congestive) and diastolic (congestive) heart failure: Secondary | ICD-10-CM

## 2021-04-23 DIAGNOSIS — N184 Chronic kidney disease, stage 4 (severe): Secondary | ICD-10-CM

## 2021-04-23 DIAGNOSIS — Z720 Tobacco use: Secondary | ICD-10-CM

## 2021-04-23 DIAGNOSIS — E1122 Type 2 diabetes mellitus with diabetic chronic kidney disease: Secondary | ICD-10-CM | POA: Diagnosis not present

## 2021-04-23 DIAGNOSIS — R072 Precordial pain: Secondary | ICD-10-CM | POA: Diagnosis not present

## 2021-04-23 DIAGNOSIS — I10 Essential (primary) hypertension: Secondary | ICD-10-CM

## 2021-04-23 DIAGNOSIS — I129 Hypertensive chronic kidney disease with stage 1 through stage 4 chronic kidney disease, or unspecified chronic kidney disease: Secondary | ICD-10-CM

## 2021-04-23 LAB — MYOCARDIAL PERFUSION IMAGING
LV dias vol: 122 mL (ref 62–150)
LV sys vol: 69 mL
Nuc Stress EF: 43 %
Peak HR: 105 {beats}/min
Rest HR: 95 {beats}/min
Rest Nuclear Isotope Dose: 10.9 mCi
SDS: 1
SRS: 1
SSS: 2
ST Depression (mm): 0 mm
Stress Nuclear Isotope Dose: 30.8 mCi
TID: 0.98

## 2021-04-23 MED ORDER — TECHNETIUM TC 99M TETROFOSMIN IV KIT
30.8000 | PACK | Freq: Once | INTRAVENOUS | Status: AC | PRN
  Administered 2021-04-23: 30.8 via INTRAVENOUS
  Filled 2021-04-23: qty 31

## 2021-04-23 MED ORDER — CARVEDILOL 12.5 MG PO TABS
12.5000 mg | ORAL_TABLET | Freq: Two times a day (BID) | ORAL | 1 refills | Status: DC
Start: 2021-04-23 — End: 2021-05-15

## 2021-04-23 MED ORDER — TECHNETIUM TC 99M TETROFOSMIN IV KIT
10.9000 | PACK | Freq: Once | INTRAVENOUS | Status: AC | PRN
  Administered 2021-04-23: 10.9 via INTRAVENOUS
  Filled 2021-04-23: qty 11

## 2021-04-23 MED ORDER — ATORVASTATIN CALCIUM 20 MG PO TABS: 20.0000 mg | ORAL_TABLET | Freq: Every day | ORAL | 3 refills | Status: DC

## 2021-04-23 MED ORDER — REGADENOSON 0.4 MG/5ML IV SOLN
0.4000 mg | Freq: Once | INTRAVENOUS | Status: AC
Start: 2021-04-23 — End: 2021-04-23
  Administered 2021-04-23: 0.4 mg via INTRAVENOUS

## 2021-04-23 NOTE — Patient Instructions (Addendum)
It was nice to meet you today. Your blood pressure is above goal of <130/80 mm Hg.   Start carvedilol 12.5 mg twice a day. Continue amlodipine 5 mg daily, isosorbide mononitrate 60 mg daily, hydralazine 100 mg three times a day, furosemide 160 mg twice a day.   Restart  atorvastatin 20 mg daily for your cholesterol.  Aim to reach goal of less than 4-5 cigarettes per day.   Continue checking your blood pressure at home.   Follow-up in 4 weeks to assess blood pressure.

## 2021-04-27 ENCOUNTER — Other Ambulatory Visit (HOSPITAL_COMMUNITY): Payer: Self-pay | Admitting: *Deleted

## 2021-04-28 ENCOUNTER — Other Ambulatory Visit: Payer: Self-pay

## 2021-04-28 ENCOUNTER — Ambulatory Visit (HOSPITAL_COMMUNITY)
Admission: RE | Admit: 2021-04-28 | Discharge: 2021-04-28 | Disposition: A | Discharge status: Home / Self Care | Payer: Medicaid Other | Source: Ambulatory Visit | Attending: Nephrology | Admitting: Nephrology

## 2021-04-28 DIAGNOSIS — N189 Chronic kidney disease, unspecified: Secondary | ICD-10-CM | POA: Diagnosis not present

## 2021-04-28 DIAGNOSIS — D631 Anemia in chronic kidney disease: Secondary | ICD-10-CM | POA: Diagnosis not present

## 2021-04-28 MED ORDER — SODIUM CHLORIDE 0.9 % IV SOLN
510.0000 mg | INTRAVENOUS | Status: DC
  Administered 2021-04-28: 510 mg via INTRAVENOUS
  Filled 2021-04-28: qty 510

## 2021-05-05 ENCOUNTER — Encounter (HOSPITAL_COMMUNITY)
Admission: RE | Admit: 2021-05-05 | Discharge: 2021-05-05 | Disposition: A | Discharge status: Home / Self Care | Payer: Medicaid Other | Source: Ambulatory Visit | Attending: Nephrology | Admitting: Nephrology

## 2021-05-05 DIAGNOSIS — N189 Chronic kidney disease, unspecified: Secondary | ICD-10-CM | POA: Insufficient documentation

## 2021-05-05 DIAGNOSIS — D631 Anemia in chronic kidney disease: Secondary | ICD-10-CM | POA: Diagnosis not present

## 2021-05-05 DIAGNOSIS — Z419 Encounter for procedure for purposes other than remedying health state, unspecified: Secondary | ICD-10-CM | POA: Diagnosis not present

## 2021-05-05 MED ORDER — SODIUM CHLORIDE 0.9 % IV SOLN
510.0000 mg | INTRAVENOUS | Status: DC
  Administered 2021-05-05: 510 mg via INTRAVENOUS
  Filled 2021-05-05: qty 17

## 2021-05-07 ENCOUNTER — Telehealth: Payer: Self-pay

## 2021-05-07 NOTE — Telephone Encounter (Signed)
The patient has been notified of the result and verbalized understanding.  All questions (if any) were answered. Antonieta Iba, RN 05/07/2021 3:23 PM   Patient is unsure what dose of carvedilol he is taking. He is visually impaired so is unable to read his bottle. Patient will call back when he has someone available to look and confirm the dosage for him.

## 2021-05-07 NOTE — Telephone Encounter (Signed)
-----   Message from [name], MD sent at 05/03/2021 11:39 AM EDT ----- Average heart rate is too high.  Please document if patient is taking carvedilol 25 mg twice daily or 12.5 mg twice daily

## 2021-05-12 DIAGNOSIS — Z89512 Acquired absence of left leg below knee: Secondary | ICD-10-CM | POA: Diagnosis not present

## 2021-05-14 NOTE — Telephone Encounter (Signed)
Called patient back. He reports he is taking carvedilol 12.5 mg BID.

## 2021-05-15 ENCOUNTER — Ambulatory Visit: Payer: Medicaid Other | Attending: Physician Assistant

## 2021-05-15 DIAGNOSIS — R2689 Other abnormalities of gait and mobility: Secondary | ICD-10-CM | POA: Diagnosis not present

## 2021-05-15 DIAGNOSIS — M6281 Muscle weakness (generalized): Secondary | ICD-10-CM | POA: Diagnosis not present

## 2021-05-15 DIAGNOSIS — Z89512 Acquired absence of left leg below knee: Secondary | ICD-10-CM | POA: Diagnosis not present

## 2021-05-15 DIAGNOSIS — M79605 Pain in left leg: Secondary | ICD-10-CM | POA: Diagnosis not present

## 2021-05-15 MED ORDER — CARVEDILOL 25 MG PO TABS
25.0000 mg | ORAL_TABLET | Freq: Two times a day (BID) | ORAL | 3 refills | Status: DC
  Filled 2021-05-15: qty 180, 90d supply, fill #0

## 2021-05-15 NOTE — Telephone Encounter (Signed)
Spoke with the patient and advised him to increase carvedilol to 25 mg daily. He will take his BP and HR for the next week and call us with a list of his readings.

## 2021-05-15 NOTE — Therapy (Addendum)
[facility] [address] [place], [place], [address] Phone: [phone]   Fax:  [phone]  Physical Therapy Evaluation  Patient Details  Name: Todd Mccoy MRN: [id number] Date of Birth: [birth year] Referring Provider (PT): Bevely Palmer Persons, [place]   Encounter Date: 05/15/2021   PT End of Session - 05/15/21 1129     Visit Number 1    Number of Visits 13    Date for PT Re-Evaluation 07/24/21    Authorization Type [employer] (12 visits)    Authorization - Visit Number 0    Authorization - Number of Visits 12    PT Start Time 1120    PT Stop Time 1145    PT Time Calculation (min) 25 min    Equipment Utilized During Treatment Gait belt    Activity Tolerance Patient tolerated treatment well    Behavior During Therapy WFL for tasks assessed/performed             Past Medical History:  Diagnosis Date   Anemia    Anemia    Anxiety    Chronic combined systolic (congestive) and diastolic (congestive) heart failure (HCC)    EF 40-45% with G3DD on echo 2022   CKD (chronic kidney disease), stage IV ([place]) 12/10/2020   Diabetes mellitus with complication ([place])    Diabetic neuropathy ([place])    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[place]) 03/25/2020   pt states retinal eye damage- left worse than the right- recent visited MD    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[place]) 03/25/2020   pt states recent MD visit /left eye worse than right   Dyslipidemia 12/10/2020   Hx of BKA, left ([place])    Hypertension    Morbid obesity ([place])    Osteomyelitis ([place])    Pneumonia     Past Surgical History:  Procedure Laterality Date   ABSCESS DRAINAGE     neck   AMPUTATION Left 11/14/2020   Procedure: LEFT 5TH RAY AMPUTATION;  Surgeon: Newt Minion, MD;  Location: [facility];  Service: Orthopedics;  Laterality: Left;   AMPUTATION Left 12/10/2020   Procedure: LEFT BELOW KNEE AMPUTATION;  Surgeon: Newt Minion, MD;  Location: [facility];   Service: Orthopedics;  Laterality: Left;   AMPUTATION TOE Right 03/25/2020   Procedure: AMPUTATION TOE;  Surgeon: Evelina Bucy, DPM;  Location: [facility];  Service: Podiatry;  Laterality: Right;   INCISION AND DRAINAGE ABSCESS Right 09/07/2014   Procedure: INCISION AND DRAINAGE ABSCESS RIGHT FLANK;  Surgeon: Jackolyn Confer, MD;  Location: [facility];  Service: General;  Laterality: Right;   LEG AMPUTATION BELOW KNEE Left 12/10/2020   SCROTUM EXPLORATION      There were no vitals filed for this visit.    Subjective Assessment - 05/15/21 1121     Subjective Pt had L BKA done in 12/2020. He just received his prosthetic on 05/12/21 and he is working with Mikki Santee at [employer]. Pt has RW at home. Pt is currently wearing this at 15 min at a time and doing that twice a day. Pt is legally blind as of last year. Vision is blurry but he can make out shapes, lights, and objects.    Currently in Pain? Yes    Pain Score 8     Pain Location Leg    Pain Orientation Left    Pain Descriptors / Indicators Tingling;Sharp;Shooting;Throbbing    Pain Type Neuropathic pain    Pain Frequency Constant  [facility] PT Assessment - 05/15/21 1116       Assessment   Medical Diagnosis L BKA    Referring Provider (PT) Bevely Palmer Persons, PA    Onset Date/Surgical Date 12/13/20      Precautions   Precautions Fall;Other (comment)   LEGALLY BLIND   Required Braces or Orthoses --   L prosthesis     Restrictions   Weight Bearing Restrictions No      Balance Screen   Has the patient fallen in the past 6 months No      [place] residence    Living Arrangements Other relatives    Available Help at Discharge Family    Type of [place] to enter    Entrance Stairs-Number of Steps 3    Entrance Stairs-Rails Can reach both;Left;Right    Home Layout Two level    Alternate Level Stairs-Number of Steps 12    Alternate Level Stairs-Rails Right    [place] - 2 wheels;Wheelchair - manual;Shower seat;Bedside commode;Grab bars - tub/shower;Grab bars - toilet      Prior Function   Level of Independence Independent      Cognition   Overall Cognitive Status Within Functional Limits for tasks assessed      Ambulation/Gait   Ambulation/Gait Yes    Ambulation/Gait Assistance 4: Min guard    Ambulation Distance (Feet) 80 Feet    Assistive device Rolling walker    Gait Pattern Decreased arm swing - right;Decreased arm swing - left;Decreased step length - right;Decreased step length - left;Decreased stance time - left;Decreased stride length;Decreased hip/knee flexion - left;Decreased weight shift to left;Antalgic      Standardized Balance Assessment   Standardized Balance Assessment Timed Up and Go Test;Five Times Sit to Stand;10 meter walk test    10 Meter Walk 0.33m/s      Timed Up and Go Test   Normal TUG (seconds) 75   With RW     Functional Gait  Assessment   Gait assessed  Yes    Gait Level Surface Walks 20 ft, slow speed, abnormal gait pattern, evidence for imbalance or deviates 10-15 in outside of the 12 in walkway width. Requires more than 7 sec to ambulate 20 ft.    Change in Gait Speed Makes only minor adjustments to walking speed, or accomplishes a change in speed with significant gait deviations, deviates 10-15 in outside the 12 in walkway width, or changes speed but loses balance but is able to recover and continue walking.    Gait with Horizontal Head Turns Performs head turns with moderate changes in gait velocity, slows down, deviates 10-15 in outside 12 in walkway width but recovers, can continue to walk.    Gait with Vertical Head Turns Performs task with moderate change in gait velocity, slows down, deviates 10-15 in outside 12 in walkway width but recovers, can continue to walk.    Gait and Pivot Turn Turns slowly, requires verbal cueing, or requires several small steps to catch balance following turn and stop     Step Over Obstacle Cannot perform without assistance.    Gait with Narrow Base of Support Ambulates less than 4 steps heel to toe or cannot perform without assistance.    Gait with Eyes Closed Cannot walk 20 ft without assistance, severe gait deviations or imbalance, deviates greater than 15 in outside 12 in walkway width or will not attempt task.  Ambulating Backwards Cannot walk 20 ft without assistance, severe gait deviations or imbalance, deviates greater than 15 in outside 12 in walkway width or will not attempt task.    Steps Cannot do safely.    Total Score 5                        Objective measurements completed on examination: See above findings.                PT Education - 05/15/21 1229     Education Details Pt educated on sweat management, proper prosthetic component care: washing liner with antibaceterial soap at night and letting it air dry, using second pair next day, cleaning socket with antibaceterial wipes at night, applying lotion to residual leg at night but wiping it off in the morning, keeping different prosthetic socks with him at all time for sock management throughout the day, carrying some clean wash clothes to wipe sweat off as needed off residual leg and inner liner. Pt discussed of getting 1-74 clicks after he stands for proper suspsension and not bottoming out. Pt educated on having family members to perform skin check 3-5 times a day. We discussed inreaseing prosthetic wear time by adding 15 min every day 2x/day. Currently he is doing 15 min, 2x/day. Progress to 30 in 2x/day and 45 min 2x/day an so forth. Pt discussed starting to use RW at home for some mobility at home.    Person(s) Educated Patient    Methods Explanation    Comprehension Verbalized understanding              PT Short Term Goals - 05/15/21 1223       PT SHORT TERM GOAL #1   Title Pt will be able to wear prosthesis for total of 6 hours a day to improve  tolerance    Baseline 30 min total (2 x 42min ) 05/15/21    Time 4    Period Weeks    Status New    Target Date 06/12/21      PT SHORT TERM GOAL #2   Title Pt will be able to ambulate 115' with RW to improve in home mobility    Baseline Pt has not ambulated with prosthesis yet (05/15/21)    Time 4    Period Weeks    Status New    Target Date 06/12/21      PT SHORT TERM GOAL #3   Title Pt will reduce wheelchair use by at least 50% during the day to improve use of prosthesis and functional mobility    Baseline 100% reliance on Wheelchair (05/15/21)    Time 4    Period Weeks    Status New    Target Date 06/12/21      PT SHORT TERM GOAL #4   Title Pt will be able to go up and down 4 steps with use of bil rail and SBA to enter in and out of the home    Baseline Currently uses wheelchair ramp, has 3 steps with bil rails at home (05/15/21)    Time 4    Period Weeks    Status New    Target Date 06/12/21               PT Long Term Goals - 05/15/21 1218       PT LONG TERM GOAL #1   Title Pt will demo TUG score <20 sec with LRAD to improve  functional mobility    Baseline TUG 75 sec RW (05/15/21)    Time 10    Period Weeks    Status New    Target Date 07/24/21      PT LONG TERM GOAL #2   Title Pt will demo gait speed of >0.50 m/s with LRAD to improve community ambulation    Baseline 0.32m/s with RW    Time 10    Period Weeks    Status New    Target Date 07/24/21      PT LONG TERM GOAL #3   Title Pt will use Walker or LRAD for 100% means of transfers and ambulation to reduce reliance on wheelchair and improve functional mobility    Baseline 100% wheelchair use for functional mobility    Time 10    Period Weeks    Status New    Target Date 07/24/21      PT LONG TERM GOAL #4   Title patient will demo at least 15/30 on FGA to improve functional balance and ambulation    Baseline 5/30 (05/15/21)    Time 10    Period Weeks    Status New    Target Date 07/24/21                     Plan - 05/15/21 1223     Clinical Impression Statement Patient is a 50 y.o. male who was seen today for gait and mobility disorder after L BKA in 12/2020. Patient demonstrates signficant gait impariemnts, decreased functional strength and decreased functional mobility. Patient received his prosthetis on 05/13/21 and has not ambulated with it yet. Patient is currently using wheelchair for 100% of mobility and trnasfers. Patient will benefit from skilled PT to educate on proper care for residual leg, prosthetic wear, gradually improve functional gait and balance to improve safety, gradually improve independence and reduce caregiver burden.    Personal Factors and Comorbidities Comorbidity 3+    Comorbidities MH is significant for HTN, HLD, CKD stage 4, T2DM with nephropathy and retinopathy, CHF, tobacco abuse, left foot osteomyelitis post BKA in 12/2020, status post left second amputation, IDA, Patient uses a wheelchair and is LEGALLY BLIND    Examination-Activity Limitations Bathing;Bend;Carry;Dressing;Hygiene/Grooming;Squat;Stairs;Stand;Toileting;Transfers;Sleep    Examination-Participation Restrictions Church;Cleaning;Community Activity;Laundry;Meal Prep;Shop;Yard Work    Stability/Clinical Decision Making Stable/Uncomplicated    Clinical Decision Making Low    Rehab Potential Good    PT Frequency 2x / week    PT Duration 6 weeks   PT Treatment/Interventions ADLs/Self Care Home Management;Cryotherapy;Electrical Stimulation;Moist Heat;Gait training;Stair training;Functional mobility training;Therapeutic activities;Therapeutic exercise;Balance training;Manual techniques;Wheelchair mobility training;Prosthetic Training;Orthotic Fit/Training;Patient/family education;Neuromuscular re-education;Scar mobilization;Passive range of motion;Energy conservation;Joint Manipulations    PT Next Visit Plan COntinue with sock management, prosthetic education, residual limb care; gait  training with walker, try stairs    PT Home Exercise Plan none issued on eval    Consulted and Agree with Plan of Care Patient;Family member/caregiver    Family Member Consulted nephew             Patient will benefit from skilled therapeutic intervention in order to improve the following deficits and impairments:  Abnormal gait, Decreased activity tolerance, Decreased balance, Decreased safety awareness, Decreased range of motion, Decreased mobility, Decreased endurance, Decreased skin integrity, Decreased strength, Difficulty walking, Impaired flexibility, Increased muscle spasms, Increased fascial restricitons, Increased edema, Impaired sensation, Impaired vision/preception, Postural dysfunction, Improper body mechanics, Prosthetic Dependency, Pain  Visit Diagnosis: Hx of BKA, left (HCC)  Other abnormalities of gait and  mobility  Muscle weakness (generalized)  Pain in left leg     Problem List Patient Active Problem List   Diagnosis Date Noted   Chronic combined systolic (congestive) and diastolic (congestive) heart failure ([place]) 04/07/2021   Chronic renal disease, stage 4, severely decreased glomerular filtration rate between 15-29 mL/min/1.73 square meter ([place])    Hypertensive urgency    AKI (acute kidney injury) ([place])    Acute CHF (congestive heart failure) ([place]) 02/27/2021   Normocytic anemia 02/27/2021   Anasarca 02/27/2021   Abscess of left foot 12/10/2020   S/P BKA (below knee amputation) unilateral, left ([place]) 12/10/2020   Dyslipidemia 12/10/2020   CKD (chronic kidney disease), stage III ([place]) 12/10/2020   Subacute osteomyelitis, left ankle and foot ([place])    Osteomyelitis of fifth toe of left foot ([place])    Severe protein-calorie malnutrition ([place])    HTN (hypertension) 11/12/2020   Osteomyelitis of second toe of right foot ([place])    Cellulitis and abscess of foot    Toe osteomyelitis ([place]) 03/24/2020   Necrotizing fasciitis ([place]) 09/08/2014   Diabetes mellitus  with hyperglycemia ([place]) 09/07/2014   Tobacco abuse 09/07/2014   Abscess of back    Abscess of lower back 09/06/2014   DM2 (diabetes mellitus, type 2) ([place]) 09/06/2014   Sepsis ([place]) 09/06/2014   Scrotal abscess 06/20/2014    Kerrie Pleasure, PT 05/15/2021, 12:33 PM  [facility] [address] [place], [place], [address] Phone: [phone]   Fax:  [phone]  Name: Todd Mccoy MRN: [id number] Date of Birth: [birth year]

## 2021-05-15 NOTE — Telephone Encounter (Signed)
Left message for patient to call back  

## 2021-05-18 ENCOUNTER — Other Ambulatory Visit: Payer: Self-pay

## 2021-05-18 ENCOUNTER — Telehealth: Payer: Self-pay | Admitting: Family Medicine

## 2021-05-18 ENCOUNTER — Other Ambulatory Visit: Payer: Self-pay | Admitting: Internal Medicine

## 2021-05-18 NOTE — Addendum Note (Signed)
Addended by: Kerrie Pleasure on: 05/18/2021 09:03 AM   Modules accepted: Orders

## 2021-05-18 NOTE — Telephone Encounter (Signed)
ERROR

## 2021-05-19 ENCOUNTER — Other Ambulatory Visit: Payer: Self-pay

## 2021-05-20 ENCOUNTER — Telehealth: Payer: Self-pay

## 2021-05-20 NOTE — Telephone Encounter (Signed)
-----   Message from [name], MD sent at 05/18/2021  9:14 AM EST ----- Nondiagnostic sleep study due to no REM and poor sleep - needs to be repeated

## 2021-05-20 NOTE — Telephone Encounter (Signed)
The patient has been notified of the result and verbalized understanding.  All questions (if any) were answered. Antonieta Iba, RN 05/20/2021 4:41 PM

## 2021-05-20 NOTE — Telephone Encounter (Signed)
I s/w the pt and he is agreeable to come in for Itamar sleep study set up. Pt has appt with our Pharm-D tomorrow at 10 am. I have spoken with Fuller Canada, Pharm-D and asked if she will let me know when they are finished with their appt with the pt, that I will need to see him afterward. Pharm-D has made note of this. I will send a message to the sleep pool to see if they may possibly be able to get an authorization maybe before appt. Pt is agreeable to plan of care.

## 2021-05-21 ENCOUNTER — Ambulatory Visit (INDEPENDENT_AMBULATORY_CARE_PROVIDER_SITE_OTHER): Payer: Medicaid Other | Admitting: Pharmacist

## 2021-05-21 ENCOUNTER — Other Ambulatory Visit: Payer: Self-pay

## 2021-05-21 ENCOUNTER — Telehealth: Payer: Self-pay | Admitting: Physician Assistant

## 2021-05-21 VITALS — BP 140/80 | HR 79

## 2021-05-21 DIAGNOSIS — E785 Hyperlipidemia, unspecified: Secondary | ICD-10-CM | POA: Diagnosis not present

## 2021-05-21 DIAGNOSIS — I129 Hypertensive chronic kidney disease with stage 1 through stage 4 chronic kidney disease, or unspecified chronic kidney disease: Secondary | ICD-10-CM | POA: Diagnosis not present

## 2021-05-21 DIAGNOSIS — Z720 Tobacco use: Secondary | ICD-10-CM

## 2021-05-21 DIAGNOSIS — N184 Chronic kidney disease, stage 4 (severe): Secondary | ICD-10-CM | POA: Diagnosis not present

## 2021-05-21 DIAGNOSIS — E782 Mixed hyperlipidemia: Secondary | ICD-10-CM | POA: Diagnosis not present

## 2021-05-21 DIAGNOSIS — I10 Essential (primary) hypertension: Secondary | ICD-10-CM | POA: Diagnosis not present

## 2021-05-21 DIAGNOSIS — E1122 Type 2 diabetes mellitus with diabetic chronic kidney disease: Secondary | ICD-10-CM

## 2021-05-21 DIAGNOSIS — N049 Nephrotic syndrome with unspecified morphologic changes: Secondary | ICD-10-CM | POA: Diagnosis not present

## 2021-05-21 LAB — LIPID PANEL
Chol/HDL Ratio: 3 ratio (ref 0.0–5.0)
Cholesterol, Total: 82 mg/dL — ABNORMAL LOW (ref 100–199)
HDL: 27 mg/dL — ABNORMAL LOW (ref >39)
LDL Chol Calc (NIH): 40 mg/dL (ref 0–99)
Triglycerides: 67 mg/dL (ref 0–149)
VLDL Cholesterol Cal: 15 mg/dL (ref 5–40)

## 2021-05-21 LAB — LDL CHOLESTEROL, DIRECT: LDL Direct: 36 mg/dL (ref 0–99)

## 2021-05-21 LAB — COMPREHENSIVE METABOLIC PANEL
ALT: 18 IU/L (ref 0–44)
AST: 25 IU/L (ref 0–40)
Albumin/Globulin Ratio: 0.8 — ABNORMAL LOW (ref 1.2–2.2)
Albumin: 2.4 g/dL — ABNORMAL LOW (ref 4.0–5.0)
Alkaline Phosphatase: 169 IU/L — ABNORMAL HIGH (ref 44–121)
BUN/Creatinine Ratio: 18 (ref 9–20)
BUN: 44 mg/dL — ABNORMAL HIGH (ref 6–24)
Bilirubin Total: 0.4 mg/dL (ref 0.0–1.2)
CO2: 20 mmol/L (ref 20–29)
Calcium: 7 mg/dL — ABNORMAL LOW (ref 8.7–10.2)
Chloride: 107 mmol/L — ABNORMAL HIGH (ref 96–106)
Creatinine, Ser: 2.48 mg/dL — ABNORMAL HIGH (ref 0.76–1.27)
Globulin, Total: 3 g/dL (ref 1.5–4.5)
Glucose: 148 mg/dL — ABNORMAL HIGH (ref 70–99)
Potassium: 3.5 mmol/L (ref 3.5–5.2)
Sodium: 139 mmol/L (ref 134–144)
Total Protein: 5.4 g/dL — ABNORMAL LOW (ref 6.0–8.5)
eGFR: 31 mL/min/{1.73_m2} — ABNORMAL LOW (ref >59)

## 2021-05-21 MED ORDER — ISOSORBIDE MONONITRATE ER 60 MG PO TB24: 60.0000 mg | ORAL_TABLET | Freq: Every day | ORAL | 3 refills | Status: DC

## 2021-05-21 MED ORDER — AMLODIPINE BESYLATE 10 MG PO TABS: 10.0000 mg | ORAL_TABLET | Freq: Every day | ORAL | 3 refills | Status: DC

## 2021-05-21 MED ORDER — CARVEDILOL 25 MG PO TABS: 25.0000 mg | ORAL_TABLET | Freq: Two times a day (BID) | ORAL | 3 refills | Status: DC

## 2021-05-21 NOTE — Patient Instructions (Addendum)
Increase your amlodipine to 10mg  daily. You can take 2 of your 5mg  tablets each day to use up your prescription. When you get your refill, it will be for the higher dose of 10mg , go back to taking 1 tablet daily at that time  Continue using up your carvedilol 12.5mg  tablet prescription - take 2 tablets twice daily. When you pick up your refill, it will be for the higher dose of 25mg . Go back to taking 1 tablet twice daily when you start on that prescription  I have sent in refills of your other prescriptions to [place] [facility] your follow up appt with your primary care doctor in a month  Continue to monitor your blood pressure at home. Your goal is < 130/32mmHg  Continue to work on cutting back on smoking  Aim for < 2,000mg  daily of sodium in your diet

## 2021-05-21 NOTE — Telephone Encounter (Signed)
Pt called requesting a refill from [facility] of pregabalin and oxycodone. Please send to pharmacy on file. Pt phone number is [phone]. Pt asking for a call when medication has been sent in.

## 2021-05-21 NOTE — Progress Notes (Signed)
Patient ID: [name]                 DOB: [birth year]                      MRN: [id number]     HPI: Todd Mccoy is a 50 y.o. male referred by Todd Mccoy to pharmacy clinic for BP management. PMH is significant for HTN, HLD, CKD stage 4, T2DM with nephropathy and retinopathy, CHF, tobacco abuse, and left foot osteomyelitis s/p BKA in 12/2020. Patient uses a wheelchair and is legally blind. Most recent LVEF 40% on 02/27/21. He was admitted to ED on 8/26 with anasarca secondary to acute combined systolic/diastolic CHF where he was treated with IV diuretics and metolazone. 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 His diuretics are being managed by nephrology. He was later seen at follow-up with Todd Mccoy where he was started on amlodipine 5 mg daily and referred to PharmD clinic for uptitration. He has been referred for sleep study to rule out OSA. Most recent GFR on 9/2 was 28.  At last visit with me on 10/20, multiple medication discrepancies were noted. I called his pharmacy to verify his rx since his HR was > 100 but he had carvedilol 25mg  BID listed on his med list.  Pharmacy had not filled carvedilol or atorvastatin in over 4 months for pt. He also had Imdur 30mg  refilled recently despite PCP increasing dose to 60mg . Pt reports [name] stopped his metolazone (we had 5mg  BID still listed) and decreased his Lasix to BID (dose unclear, previously taking 160mg  TID). Unable to see most recent note and pharmacy had not filled rx more recently than this. I resumed his carvedilol at slightly lower dose of 12.5mg  BID to help with tolerability while re-initiating therapy and refilled his atorvastatin. He was also encouraged to decrease tobacco use. Stage 4 CKD limits GDMT for CHF as pt is unable to start Entresto, SGLT2i, or spironolactone. Carvedilol was increased on  11/3 to 25mg  BID by Dr Radford Mccoy after his monitor showed average HR of 104. He wore this 1 week after I restarted his carvedilol as his HR was also 104 in the office when I saw him. I advised pt to bring in all medications to follow up visit due to ongoing confusion over Imdur and Lasix dose, and need to verify adherence to carvedilol since HR remains > 100 after re-initiation of beta blocker therapy.  Today, patient presents for med management. He states his BP at home runs about 150/80 but has ranged from 244-010 systolic up to 272 systolic. Reports tolerating medications well. Denies chest pain and LE edema. Did not bring in home medications today but called his niece who was at home to confirm dosing on a few meds. He is still taking Imdur 30mg  daily although PCP increased his dose 2 months ago. His Lasix rx is for 80mg  to take TID but pt states he is just taking it once a day because he hasn't noticed any swelling. Also notes he is not taking potassium supplement. He notes he did not experience angioedema with amlodipine (as referenced in note from [place] on 9/8) but only mild swelling in his legs from years ago. Has not had any swelling with resumption of amlodipine though. Still using old rx of carvedilol 12.5mg  but reports taking 2 tabs BID. New  rx for higher 25mg  dose resent to [facility]. He has cut back on smoking from 1/2 PPD to 5-6 cigarettes. Has been trying to keep an eye on his diet.  Current CHF meds: amlodipine 5 mg daily (morning), isosorbide mononitrate 30 mg daily (morning), hydralazine 100 mg TID,  carvedilol 25 mg BID, furosemide 80mg  daily  Other Meds: Klor-con 10 mEq BID  Previously tried: losartan 100 mg daily (stopped at 8/26 hospitalization), losartan-hctz 100-25 mg daily (stopped back in 6/22 visit), lisinopril 5 mg daily, furosemide 20 mg daily, metolazone 5 mg BID  BP goal: <130/55mmHg  Family History: family history includes Diabetes in his brother, father,  and mother.  Social History:  reports that he has been smoking cigarettes. He has a 32.00 pack-year smoking history. He has never used smokeless tobacco. He reports that he does not drink alcohol and does not use drugs. Half a pack a day . Has tried vape to wean off cigarettes with no success.   Wt Readings from Last 3 Encounters:  04/28/21 180 lb (81.6 kg)  04/23/21 180 lb (81.6 kg)  04/07/21 180 lb (81.6 kg)   BP Readings from Last 3 Encounters:  05/05/21 129/79  04/28/21 115/75  04/23/21 138/70   Pulse Readings from Last 3 Encounters:  05/05/21 76  04/28/21 73  04/23/21 (!) 104    Renal function: CrCl cannot be calculated (Patient's most recent lab result is older than the maximum 21 days allowed.).  Past Medical History:  Diagnosis Date   Anemia    Anemia    Anxiety    Chronic combined systolic (congestive) and diastolic (congestive) heart failure (HCC)    EF 40-45% with G3DD on echo 2022   CKD (chronic kidney disease), stage IV ([place]) 12/10/2020   Diabetes mellitus with complication (HCC)    Diabetic neuropathy ([place])    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[place]) 03/25/2020   pt states retinal eye damage- left worse than the right- recent visited MD    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[place]) 03/25/2020   pt states recent MD visit /left eye worse than right   Dyslipidemia 12/10/2020   Hx of BKA, left ([place])    Hypertension    Morbid obesity ([place])    Osteomyelitis (HCC)    Pneumonia     Current Outpatient Medications on File Prior to Visit  Medication Sig Dispense Refill   acetaminophen (TYLENOL) 325 MG tablet Take 650 mg by mouth every 6 (six) hours as needed for mild pain.     amLODipine (NORVASC) 5 MG tablet Take 1 tablet (5 mg total) by mouth daily. 90 tablet 3   atorvastatin (LIPITOR) 20 MG tablet Take 1 tablet (20 mg total) by mouth daily. 90 tablet 3   carvedilol (COREG) 25 MG tablet Take 1 tablet (25 mg total) by mouth 2 (two) times daily. 180 tablet 3    ferrous sulfate 325 (65 FE) MG tablet Take 1 tablet (325 mg total) by mouth 3 (three) times daily with meals. (Patient taking differently: Take 325 mg by mouth in the morning and at bedtime.) 90 tablet 2   furosemide (LASIX) 80 MG tablet Take 2 tablets (160 mg total) by mouth in the morning, at noon, and at bedtime. (Patient taking differently: Take 160 mg by mouth 2 (two) times daily.) 180 tablet 1   hydrALAZINE (APRESOLINE) 100 MG tablet Take 1 tablet (100 mg total) by mouth 3 (three) times daily. 90 tablet 1   hydrOXYzine (ATARAX/VISTARIL) 25  MG tablet Take 1 tablet (25 mg total) by mouth at bedtime as needed. 30 tablet 1   insulin glargine (LANTUS) 100 UNIT/ML Solostar Pen Inject 10 Units into the skin at bedtime. 9 mL 3   Insulin Pen Needle 32G X 4 MM MISC USE TO INJECT INSULIN DAILY. 100 each 3   isosorbide mononitrate (IMDUR) 60 MG 24 hr tablet Take 1 tablet (60 mg total) by mouth daily. (Patient taking differently: Take 60 mg by mouth daily. Pt still taking 30mg  daily) 30 tablet 3   Naphazoline HCl (CLEAR EYES OP) Place 1 drop into both eyes as needed (irritation).     oxyCODONE (OXY IR/ROXICODONE) 5 MG immediate release tablet Take 1 tablet (5 mg total) by mouth every 8 (eight) hours as needed for moderate pain (pain score 4-6). 30 tablet 0   polyethylene glycol powder (GLYCOLAX/MIRALAX) 17 GM/SCOOP powder Take 17 g by mouth 2 (two) times daily as needed. 3350 g 1   potassium chloride (KLOR-CON) 10 MEQ tablet Take 1 tablet (10 mEq total) by mouth 2 (two) times daily. 60 tablet 1   pregabalin (LYRICA) 25 MG capsule Take 1 capsule (25 mg total) by mouth 2 (two) times daily. 60 capsule 0   pregabalin (LYRICA) 50 MG capsule Take 1 capsule (50 mg total) by mouth 2 (two) times daily. 60 capsule 0   [DISCONTINUED] insulin aspart protamine- aspart (NOVOLOG MIX 70/30) (70-30) 100 UNIT/ML injection Inject 0.35 mLs (35 Units total) into the skin 2 (two) times daily. 60 mL 3   [DISCONTINUED]  losartan-hydrochlorothiazide (HYZAAR) 100-25 MG tablet TAKE 1 TABLET BY MOUTH DAILY. 30 tablet 6   [DISCONTINUED] metFORMIN (GLUCOPHAGE-XR) 500 MG 24 hr tablet TAKE 1 TABLET (500 MG TOTAL) BY MOUTH 2 (TWO) TIMES DAILY. 60 tablet 2   No current facility-administered medications on file prior to visit.    Allergies  Allergen Reactions   Amlodipine Swelling    BLE edema     Assessment/Plan:  1. CHF/HTN -  Patient's BP is above goal of <130/80 mm Hg. Will increase amlodipine to 10mg  daily. Continue hydralazine 100mg  TID. Refill sent in on Imdur 60mg  daily (PCP had increased dose a few months ago but pt was still taking 30mg ). Pt also taking Lasix 80mg  daily instead of TID as prescribed but reports no edema. Also stopped taking his K supplement so will recheck BMET today. Stage 4 CKD limiting GDMT for CHF as pt is unable to start Entresto, SGLT2i, or spironolactone. Pt sees PCP for follow up in 1 month and BP will be rechecked then. Can follow up with PharmD as needed after.  2. Tobacco use - Pt has cut back on cigarettes from 1/2 PPD to 5-6 cigarettes per day. Encouraged him to continue cutting back with goal of complete cessation.  3. HLD - Rechecking annual lipid panel today, pt resumed atorvastatin 20mg  daily 1 month ago.   Jaylyne Breese E. Emelio Schneller, PharmD, BCACP, [facility] [address], [place], [place] [address] Phone: [phone]; Fax: [phone] 05/21/2021 7:26 AM

## 2021-05-21 NOTE — Telephone Encounter (Signed)
I met with the pt today after his appt with [facility]. I gave him a new Itamar study. I have registered the new serial # into Itamar. Pt aware once we have the results we will call him. Pt thanked me for the help.

## 2021-05-21 NOTE — Telephone Encounter (Signed)
Clementeen Hoof . I think he last saw u?

## 2021-05-22 MED ORDER — PREGABALIN 50 MG PO CAPS: 50.0000 mg | ORAL_CAPSULE | Freq: Two times a day (BID) | ORAL | 0 refills | Status: DC

## 2021-05-22 MED ORDER — OXYCODONE HCL 5 MG PO TABS: 5.0000 mg | ORAL_TABLET | Freq: Two times a day (BID) | ORAL | 0 refills | Status: DC | PRN

## 2021-06-01 ENCOUNTER — Telehealth: Payer: Self-pay | Admitting: Physical Therapy

## 2021-06-01 ENCOUNTER — Ambulatory Visit: Payer: Medicaid Other | Admitting: Physical Therapy

## 2021-06-01 NOTE — Telephone Encounter (Signed)
Called and left a message for the pt to let him know of his missed appointment and to encourage him to call the office and reschedule. Bjorn Loser, PTA  06/01/21, 10:03 AM

## 2021-06-01 NOTE — Telephone Encounter (Signed)
Called and left a message to let pt know of missed appointment and to encourage pt to call front office to reschedule appointment. Bjorn Loser, PTA  06/01/21, 10:08 AM

## 2021-06-04 DIAGNOSIS — Z419 Encounter for procedure for purposes other than remedying health state, unspecified: Secondary | ICD-10-CM | POA: Diagnosis not present

## 2021-06-05 ENCOUNTER — Encounter: Payer: Self-pay | Admitting: Physical Therapy

## 2021-06-05 ENCOUNTER — Other Ambulatory Visit: Payer: Self-pay

## 2021-06-05 ENCOUNTER — Ambulatory Visit: Payer: Medicaid Other | Attending: Family Medicine | Admitting: Physical Therapy

## 2021-06-05 DIAGNOSIS — M6281 Muscle weakness (generalized): Secondary | ICD-10-CM | POA: Insufficient documentation

## 2021-06-05 DIAGNOSIS — R2689 Other abnormalities of gait and mobility: Secondary | ICD-10-CM | POA: Diagnosis not present

## 2021-06-05 DIAGNOSIS — Z89512 Acquired absence of left leg below knee: Secondary | ICD-10-CM | POA: Insufficient documentation

## 2021-06-05 DIAGNOSIS — M79605 Pain in left leg: Secondary | ICD-10-CM | POA: Diagnosis not present

## 2021-06-05 NOTE — Therapy (Signed)
[facility] [address] [place], [place], [address] Phone: [phone]   Fax:  [phone]  Physical Therapy Treatment  Patient Details  Name: Todd Mccoy MRN: [id number] Date of Birth: 05-Mar-1971 Referring Provider (PT): Bevely Palmer Persons, [place]   Encounter Date: 06/05/2021   PT End of Session - 06/05/21 1037     Visit Number 2    Number of Visits 13    Date for PT Re-Evaluation 07/24/21    Authorization Type [employer] (12 visits)- awaiting auth    Authorization - Number of Visits 12    PT Start Time 1036   pt running late for appt today   PT Stop Time 1100    PT Time Calculation (min) 24 min    Equipment Utilized During Treatment Gait belt    Activity Tolerance Patient tolerated treatment well    Behavior During Therapy WFL for tasks assessed/performed             Past Medical History:  Diagnosis Date   Anemia    Anemia    Anxiety    Chronic combined systolic (congestive) and diastolic (congestive) heart failure (HCC)    EF 40-45% with G3DD on echo 2022   CKD (chronic kidney disease), stage IV ([place]) 12/10/2020   Diabetes mellitus with complication ([place])    Diabetic neuropathy ([place])    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[place]) 03/25/2020   pt states retinal eye damage- left worse than the right- recent visited MD    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[place]) 03/25/2020   pt states recent MD visit /left eye worse than right   Dyslipidemia 12/10/2020   Hx of BKA, left ([place])    Hypertension    Morbid obesity ([place])    Osteomyelitis ([place])    Pneumonia     Past Surgical History:  Procedure Laterality Date   ABSCESS DRAINAGE     neck   AMPUTATION Left 11/14/2020   Procedure: LEFT 5TH RAY AMPUTATION;  Surgeon: Newt Minion, MD;  Location: [facility];  Service: Orthopedics;  Laterality: Left;   AMPUTATION Left 12/10/2020   Procedure: LEFT BELOW KNEE AMPUTATION;  Surgeon: Newt Minion, MD;  Location:  [facility];  Service: Orthopedics;  Laterality: Left;   AMPUTATION TOE Right 03/25/2020   Procedure: AMPUTATION TOE;  Surgeon: Evelina Bucy, DPM;  Location: [facility];  Service: Podiatry;  Laterality: Right;   INCISION AND DRAINAGE ABSCESS Right 09/07/2014   Procedure: INCISION AND DRAINAGE ABSCESS RIGHT FLANK;  Surgeon: Jackolyn Confer, MD;  Location: [facility];  Service: General;  Laterality: Right;   LEG AMPUTATION BELOW KNEE Left 12/10/2020   SCROTUM EXPLORATION      There were no vitals filed for this visit.   Subjective Assessment - 06/05/21 1036     Subjective No new complaints. No falls or pain to report.    Patient is accompained by: Family member   nephew, Hart [place]   Currently in Pain? No/denies    Pain Score 0-No pain                    [facility] Adult PT Treatment/Exercise - 06/05/21 1038       Transfers   Transfers Sit to Stand;Stand to Sit    Sit to Stand 5: Supervision;With upper extremity assist;From chair/3-in-1    Sit to Stand Details Verbal cues for technique;Verbal cues for precautions/safety;Verbal cues for safe use of DME/AE    Stand to  Sit 5: Supervision;Without upper extremity assist;To chair/3-in-1    Stand to Sit Details (indicate cue type and reason) Verbal cues for sequencing;Verbal cues for technique;Verbal cues for precautions/safety;Verbal cues for safe use of DME/AE      Ambulation/Gait   Ambulation/Gait Yes    Ambulation/Gait Assistance 4: Min guard;4: Min assist    Ambulation/Gait Assistance Details cues needed for walker negotiation due to visual deficts. cues throughout gait to bring left LE futher out with swing phase to initial contact to widen base of support with gait as pt tends to keep prosthetic in center with gait. cues on weight shifting onto prosthesis in stance, for posture and walker position with gait.    Ambulation Distance (Feet) 70 Feet   x1, 50 x1   Assistive device Rolling walker;Prosthesis    Gait Pattern Step-to pattern;Step-through  pattern;Decreased stride length;Decreased step length - right;Decreased step length - left;Decreased arm swing - right;Decreased arm swing - left;Decreased stance time - left;Narrow base of support;Trunk flexed    Ambulation Surface Level;Indoor      Prosthetics   Prosthetic Care Comments  discussed wearing prosthesis every day, even if staying in the house. pt and Nephew verbally agreed to this.    Current prosthetic wear tolerance (days/week)  only when going out of house    Current prosthetic wear tolerance (#hours/day)  1-2 hours when he has it on    Residual limb condition  intact per pt report (has someone who helps him don prosthesis at home).    Education Provided Proper wear schedule/adjustment;Proper weight-bearing schedule/adjustment    Person(s) Educated Patient;Other (comment)   Nephew   Education Method Explanation;Demonstration;Verbal cues    Education Method Verbalized understanding;Returned demonstration;Verbal cues required;Needs further instruction    Donning Prosthesis Minimal assist    Doffing Prosthesis Minimal assist                   PT Short Term Goals - 05/15/21 1223       PT SHORT TERM GOAL #1   Title Pt will be able to wear prosthesis for total of 6 hours a day to improve tolerance    Baseline 30 min total (2 x 38min ) 05/15/21    Time 4    Period Weeks    Status New    Target Date 06/12/21      PT SHORT TERM GOAL #2   Title Pt will be able to ambulate 115' with RW to improve in home mobility    Baseline Pt has not ambulated with prosthesis yet (05/15/21)    Time 4    Period Weeks    Status New    Target Date 06/12/21      PT SHORT TERM GOAL #3   Title Pt will reduce wheelchair use by at least 50% during the day to improve use of prosthesis and functional mobility    Baseline 100% reliance on Wheelchair (05/15/21)    Time 4    Period Weeks    Status New    Target Date 06/12/21      PT SHORT TERM GOAL #4   Title Pt will be able to go up  and down 4 steps with use of bil rail and SBA to enter in and out of the home    Baseline Currently uses wheelchair ramp, has 3 steps with bil rails at home (05/15/21)    Time 4    Period Weeks    Status New    Target Date 06/12/21  PT Long Term Goals - 05/15/21 1218       PT LONG TERM GOAL #1   Title Pt will demo TUG score <20 sec with LRAD to improve functional mobility    Baseline TUG 75 sec RW (05/15/21)    Time 10    Period Weeks    Status New    Target Date 07/24/21      PT LONG TERM GOAL #2   Title Pt will demo gait speed of >0.50 m/s with LRAD to improve community ambulation    Baseline 0.34m/s with RW    Time 10    Period Weeks    Status New    Target Date 07/24/21      PT LONG TERM GOAL #3   Title Pt will use Walker or LRAD for 100% means of transfers and ambulation to reduce reliance on wheelchair and improve functional mobility    Baseline 100% wheelchair use for functional mobility    Time 10    Period Weeks    Status New    Target Date 07/24/21      PT LONG TERM GOAL #4   Title patient will demo at least 15/30 on FGA to improve functional balance and ambulation    Baseline 5/30 (05/15/21)    Time 10    Period Weeks    Status New    Target Date 07/24/21                   Plan - 06/05/21 1037     Clinical Impression Statement Today's skilled session continued to address prosthetic education and gait with prosthesis/walker. Pt continues to need cues with gait due to visual deficits and to correct gait deviations with prosthesis. Should benefit from continued PT to progress toward unmet goals.    Personal Factors and Comorbidities Comorbidity 3+    Comorbidities MH is significant for HTN, HLD, CKD stage 4, T2DM with nephropathy and retinopathy, CHF, tobacco abuse, left foot osteomyelitis post BKA in 12/2020, status post left second amputation, IDA, Patient uses a wheelchair and is LEGALLY BLIND    Examination-Activity Limitations  Bathing;Bend;Carry;Dressing;Hygiene/Grooming;Squat;Stairs;Stand;Toileting;Transfers;Sleep    Examination-Participation Restrictions Church;Cleaning;Community Activity;Laundry;Meal Prep;Shop;Yard Work    Stability/Clinical Decision Making Stable/Uncomplicated    Rehab Potential Good    PT Frequency 2x / week    PT Duration 6 weeks   12 SESSIONS (10 WEEKS)   PT Treatment/Interventions ADLs/Self Care Home Management;Cryotherapy;Electrical Stimulation;Moist Heat;Gait training;Stair training;Functional mobility training;Therapeutic activities;Therapeutic exercise;Balance training;Manual techniques;Wheelchair mobility training;Prosthetic Training;Orthotic Fit/Training;Patient/family education;Neuromuscular re-education;Scar mobilization;Passive range of motion;Energy conservation;Joint Manipulations    PT Next Visit Plan Has pt increased wear time? continue with prosthetic education- if pt has looser pants on (had on jeans today) practice with pt self donning/doffing liner/prosthesis to work toward more indepedance at home. continue to work on gait with prosthesis/RW- try gait in parallel bars with center board (if pt able to see it) to work towards wider base of support. issue sink HEP for balance and proprioception    PT Home Exercise Plan --    Consulted and Agree with Plan of Care Patient;Family member/caregiver    Family Member Consulted nephew             Patient will benefit from skilled therapeutic intervention in order to improve the following deficits and impairments:  Abnormal gait, Decreased activity tolerance, Decreased balance, Decreased safety awareness, Decreased range of motion, Decreased mobility, Decreased endurance, Decreased skin integrity, Decreased strength, Difficulty walking, Impaired flexibility, Increased muscle spasms, Increased  fascial restricitons, Increased edema, Impaired sensation, Impaired vision/preception, Postural dysfunction, Improper body mechanics, Prosthetic  Dependency, Pain  Visit Diagnosis: Other abnormalities of gait and mobility  Muscle weakness (generalized)     Problem List Patient Active Problem List   Diagnosis Date Noted   Chronic combined systolic (congestive) and diastolic (congestive) heart failure ([place]) 04/07/2021   Chronic renal disease, stage 4, severely decreased glomerular filtration rate between 15-29 mL/min/1.73 square meter (HCC)    Hypertensive urgency    AKI (acute kidney injury) ([place])    Acute CHF (congestive heart failure) ([place]) 02/27/2021   Normocytic anemia 02/27/2021   Anasarca 02/27/2021   Abscess of left foot 12/10/2020   S/P BKA (below knee amputation) unilateral, left ([place]) 12/10/2020   Dyslipidemia 12/10/2020   CKD (chronic kidney disease), stage III ([place]) 12/10/2020   Subacute osteomyelitis, left ankle and foot ([place])    Osteomyelitis of fifth toe of left foot ([place])    Severe protein-calorie malnutrition ([place])    HTN (hypertension) 11/12/2020   Osteomyelitis of second toe of right foot ([place])    Cellulitis and abscess of foot    Toe osteomyelitis ([place]) 03/24/2020   Necrotizing fasciitis ([place]) 09/08/2014   Diabetes mellitus with hyperglycemia ([place]) 09/07/2014   Tobacco abuse 09/07/2014   Abscess of back    Abscess of lower back 09/06/2014   DM2 (diabetes mellitus, type 2) ([place]) 09/06/2014   Sepsis ([place]) 09/06/2014   Scrotal abscess 06/20/2014    Willow Ora, PTA, [facility] [address], [place], [place] [address] [phone] 06/05/21, 11:32 AM   Name: Todd Mccoy MRN: [id number] Date of Birth: 1970/08/12

## 2021-06-10 ENCOUNTER — Ambulatory Visit: Payer: Medicaid Other

## 2021-06-11 ENCOUNTER — Other Ambulatory Visit: Payer: Self-pay | Admitting: Family Medicine

## 2021-06-11 DIAGNOSIS — G4709 Other insomnia: Secondary | ICD-10-CM

## 2021-06-12 ENCOUNTER — Ambulatory Visit: Payer: Medicaid Other

## 2021-06-12 ENCOUNTER — Other Ambulatory Visit: Payer: Self-pay

## 2021-06-12 DIAGNOSIS — Z89512 Acquired absence of left leg below knee: Secondary | ICD-10-CM | POA: Diagnosis not present

## 2021-06-12 DIAGNOSIS — M79605 Pain in left leg: Secondary | ICD-10-CM

## 2021-06-12 DIAGNOSIS — M6281 Muscle weakness (generalized): Secondary | ICD-10-CM | POA: Diagnosis not present

## 2021-06-12 DIAGNOSIS — R2689 Other abnormalities of gait and mobility: Secondary | ICD-10-CM

## 2021-06-12 NOTE — Therapy (Signed)
[facility] [address] [place], [place], [address] Phone: [phone]   Fax:  [phone]  Physical Therapy Treatment  Patient Details  Name: Todd Mccoy MRN: [id number] Date of Birth: 1970/09/03 Referring Provider (PT): Bevely Palmer Persons, [place]   Encounter Date: 06/12/2021   PT End of Session - 06/12/21 0942     Visit Number 3    Number of Visits 13    Date for PT Re-Evaluation 07/24/21    Authorization Type Well care (12 visits)- awaiting auth    Authorization - Number of Visits 12    PT Start Time 0935    PT Stop Time 1015    PT Time Calculation (min) 40 min    Equipment Utilized During Treatment Gait belt    Activity Tolerance Patient tolerated treatment well    Behavior During Therapy WFL for tasks assessed/performed             Past Medical History:  Diagnosis Date   Anemia    Anemia    Anxiety    Chronic combined systolic (congestive) and diastolic (congestive) heart failure (HCC)    EF 40-45% with G3DD on echo 2022   CKD (chronic kidney disease), stage IV ([place]) 12/10/2020   Diabetes mellitus with complication ([place])    Diabetic neuropathy ([place])    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[place]) 03/25/2020   pt states retinal eye damage- left worse than the right- recent visited MD    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[place]) 03/25/2020   pt states recent MD visit /left eye worse than right   Dyslipidemia 12/10/2020   Hx of BKA, left ([place])    Hypertension    Morbid obesity ([place])    Osteomyelitis ([place])    Pneumonia     Past Surgical History:  Procedure Laterality Date   ABSCESS DRAINAGE     neck   AMPUTATION Left 11/14/2020   Procedure: LEFT 5TH RAY AMPUTATION;  Surgeon: Newt Minion, MD;  Location: [facility];  Service: Orthopedics;  Laterality: Left;   AMPUTATION Left 12/10/2020   Procedure: LEFT BELOW KNEE AMPUTATION;  Surgeon: Newt Minion, MD;  Location: [facility];  Service: Orthopedics;   Laterality: Left;   AMPUTATION TOE Right 03/25/2020   Procedure: AMPUTATION TOE;  Surgeon: Evelina Bucy, DPM;  Location[facility];  Service: Podiatry;  Laterality: Right;   INCISION AND DRAINAGE ABSCESS Right 09/07/2014   Procedure: INCISION AND DRAINAGE ABSCESS RIGHT FLANK;  Surgeon: Jackolyn Confer, MD;  Location: [facility];  Service: General;  Laterality: Right;   LEG AMPUTATION BELOW KNEE Left 12/10/2020   SCROTUM EXPLORATION      There were no vitals filed for this visit.   Subjective Assessment - 06/12/21 0942     Subjective Pt reports he is trying to wear it for 5 hours /a day. Nerve pain is worst at night time.    Patient is accompained by: Family member   nephew, Todd Mccoy   Currently in Pain? No/denies                 Gait training: 1 x 115', 1 x 95' with RW, CGA, required multiple rest breaks in standing due to decreased enruance, slow cadence, inconsistent step length, increased WB through UB  Static Balance activities: Wide BOS: unable to maintain balance for more than 3-5 seconds and requires UE support; tried with 1 finger assist from each hand: 10 sec  Weight shifting laterally with countertop  support and reaching forward in oppoosite direction: 10x R and L, with tactile cues at pelvis to improve weight shifting: 2 x 10 R and L  Pt education: Pt educated on brushing teeth with standing in his prosthetic with one hand on countertop for support. Taking breasks as needed Pt educated to use walker to walk short distances at home to improve endurance with standing and walking Pt educated to bring socks next time.                           PT Short Term Goals - 05/15/21 1223       PT SHORT TERM GOAL #1   Title Pt will be able to wear prosthesis for total of 6 hours a day to improve tolerance    Baseline 30 min total (2 x 58min ) 05/15/21    Time 4    Period Weeks    Status New    Target Date 06/12/21      PT SHORT TERM GOAL #2   Title Pt  will be able to ambulate 115' with RW to improve in home mobility    Baseline Pt has not ambulated with prosthesis yet (05/15/21)    Time 4    Period Weeks    Status New    Target Date 06/12/21      PT SHORT TERM GOAL #3   Title Pt will reduce wheelchair use by at least 50% during the day to improve use of prosthesis and functional mobility    Baseline 100% reliance on Wheelchair (05/15/21)    Time 4    Period Weeks    Status New    Target Date 06/12/21      PT SHORT TERM GOAL #4   Title Pt will be able to go up and down 4 steps with use of bil rail and SBA to enter in and out of the home    Baseline Currently uses wheelchair ramp, has 3 steps with bil rails at home (05/15/21)    Time 4    Period Weeks    Status New    Target Date 06/12/21               PT Long Term Goals - 05/15/21 1218       PT LONG TERM GOAL #1   Title Pt will demo TUG score <20 sec with LRAD to improve functional mobility    Baseline TUG 75 sec RW (05/15/21)    Time 10    Period Weeks    Status New    Target Date 07/24/21      PT LONG TERM GOAL #2   Title Pt will demo gait speed of >0.50 m/s with LRAD to improve community ambulation    Baseline 0.88m/s with RW    Time 10    Period Weeks    Status New    Target Date 07/24/21      PT LONG TERM GOAL #3   Title Pt will use Walker or LRAD for 100% means of transfers and ambulation to reduce reliance on wheelchair and improve functional mobility    Baseline 100% wheelchair use for functional mobility    Time 10    Period Weeks    Status New    Target Date 07/24/21      PT LONG TERM GOAL #4   Title patient will demo at least 15/30 on FGA to improve functional balance and ambulation  Baseline 5/30 (05/15/21)    Time 10    Period Weeks    Status New    Target Date 07/24/21                   Plan - 06/12/21 0943     Clinical Impression Statement Pt demonstrates decreased standing and walking endurance. Patient is not  practicing standing or wlaking as much at home. Today's session was focused on encouraging patient to incorporate standing and wlaking more at home and gradually decreasing relinace on wheelchair at home.    Personal Factors and Comorbidities Comorbidity 3+    Comorbidities MH is significant for HTN, HLD, CKD stage 4, T2DM with nephropathy and retinopathy, CHF, tobacco abuse, left foot osteomyelitis post BKA in 12/2020, status post left second amputation, IDA, Patient uses a wheelchair and is LEGALLY BLIND    Examination-Activity Limitations Bathing;Bend;Carry;Dressing;Hygiene/Grooming;Squat;Stairs;Stand;Toileting;Transfers;Sleep    Examination-Participation Restrictions Church;Cleaning;Community Activity;Laundry;Meal Prep;Shop;Yard Work    Stability/Clinical Decision Making Stable/Uncomplicated    Rehab Potential Good    PT Frequency 2x / week    PT Duration 6 weeks   12 SESSIONS (10 WEEKS)   PT Treatment/Interventions ADLs/Self Care Home Management;Cryotherapy;Electrical Stimulation;Moist Heat;Gait training;Stair training;Functional mobility training;Therapeutic activities;Therapeutic exercise;Balance training;Manual techniques;Wheelchair mobility training;Prosthetic Training;Orthotic Fit/Training;Patient/family education;Neuromuscular re-education;Scar mobilization;Passive range of motion;Energy conservation;Joint Manipulations    PT Next Visit Plan Has pt increased wear time? continue with prosthetic education- if pt has looser pants on (had on jeans today) practice with pt self donning/doffing liner/prosthesis to work toward more indepedance at home. continue to work on gait with prosthesis/RW- try gait in parallel bars with center board (if pt able to see it) to work towards wider base of support. issue sink HEP for balance and proprioception    Consulted and Agree with Plan of Care Patient;Family member/caregiver    Family Member Consulted nephew             Patient will benefit from  skilled therapeutic intervention in order to improve the following deficits and impairments:  Abnormal gait, Decreased activity tolerance, Decreased balance, Decreased safety awareness, Decreased range of motion, Decreased mobility, Decreased endurance, Decreased skin integrity, Decreased strength, Difficulty walking, Impaired flexibility, Increased muscle spasms, Increased fascial restricitons, Increased edema, Impaired sensation, Impaired vision/preception, Postural dysfunction, Improper body mechanics, Prosthetic Dependency, Pain  Visit Diagnosis: Other abnormalities of gait and mobility  Muscle weakness (generalized)  Hx of BKA, left (HCC)  Pain in left leg     Problem List Patient Active Problem List   Diagnosis Date Noted   Chronic combined systolic (congestive) and diastolic (congestive) heart failure (HCC) 04/07/2021   Chronic renal disease, stage 4, severely decreased glomerular filtration rate between 15-29 mL/min/1.73 square meter (HCC)    Hypertensive urgency    AKI (acute kidney injury) ([place])    Acute CHF (congestive heart failure) ([place]) 02/27/2021   Normocytic anemia 02/27/2021   Anasarca 02/27/2021   Abscess of left foot 12/10/2020   S/P BKA (below knee amputation) unilateral, left ([place]) 12/10/2020   Dyslipidemia 12/10/2020   CKD (chronic kidney disease), stage III ([place]) 12/10/2020   Subacute osteomyelitis, left ankle and foot (HCC)    Osteomyelitis of fifth toe of left foot (HCC)    Severe protein-calorie malnutrition (HCC)    HTN (hypertension) 11/12/2020   Osteomyelitis of second toe of right foot ([place])    Cellulitis and abscess of foot    Toe osteomyelitis ([place]) 03/24/2020   Necrotizing fasciitis ([place]) 09/08/2014   Diabetes mellitus with hyperglycemia ([place])  09/07/2014   Tobacco abuse 09/07/2014   Abscess of back    Abscess of lower back 09/06/2014   DM2 (diabetes mellitus, type 2) ([place]) 09/06/2014   Sepsis ([place]) 09/06/2014   Scrotal abscess 06/20/2014     Kerrie Pleasure, PT 06/12/2021, 10:12 AM  [facility] [address] [place], [place], [address] Phone: [phone]   Fax:  [phone]  Name: Todd Mccoy MRN: [id number] Date of Birth: 1971-02-02

## 2021-06-15 ENCOUNTER — Other Ambulatory Visit: Payer: Self-pay

## 2021-06-17 ENCOUNTER — Ambulatory Visit (INDEPENDENT_AMBULATORY_CARE_PROVIDER_SITE_OTHER): Payer: Medicaid Other | Admitting: Family

## 2021-06-17 ENCOUNTER — Encounter: Payer: Self-pay | Admitting: Family

## 2021-06-17 DIAGNOSIS — Z89512 Acquired absence of left leg below knee: Secondary | ICD-10-CM | POA: Diagnosis not present

## 2021-06-17 MED ORDER — OXYCODONE HCL 5 MG PO TABS: 5.0000 mg | ORAL_TABLET | Freq: Two times a day (BID) | ORAL | 0 refills | Status: DC | PRN

## 2021-06-17 MED ORDER — PREGABALIN 50 MG PO CAPS: 50.0000 mg | ORAL_CAPSULE | Freq: Two times a day (BID) | ORAL | 3 refills | Status: DC

## 2021-06-17 NOTE — Progress Notes (Signed)
Office Visit Note   Patient: Todd Mccoy           Date of Birth: [birth year]           MRN: [id number] Visit Date: 06/17/2021              Requested by: [name], MD [place],  [place] [address] PCP: [name], MD  Chief Complaint  Patient presents with   Left Leg - Follow-up    S/p left BKA 12/10/20      HPI: Patient is a pleasant 50 year old gentleman who is status post below-knee amputation.  He continues to complain of issues with phantom pain.  He has been prescribed Lyrica 50 mg bid.  Unclear whether he is taking this twice daily.  Is taking oxycodone at bedtime. States cannot sleep well without the oxycodone.  He states he has been casted for his prosthesis. States he is still in PT.  No new concerns  Assessment & Plan: Visit Diagnoses:  1. S/P BKA (below knee amputation) unilateral, left (HCC)     Plan: Continue Lyrica twice daily.  Encouraged him to take this twice daily.  We will refill the oxycodone 1 more time.  He will follow-up in the office as needed Follow-Up Instructions: Return if symptoms worsen or fail to improve.   Ortho Exam  Patient is alert, oriented, no adenopathy, well-dressed, normal affect, normal respiratory effort. Left below-knee amputation is well-healed. This has consolidating well.  Imaging: No results found. No images are attached to the encounter.  Labs: Lab Results  Component Value Date   HGBA1C 7.2 (H) 02/27/2021   HGBA1C 7.1 (A) 02/25/2021   HGBA1C 8.6 (H) 11/12/2020   ESRSEDRATE 134 (H) 12/10/2020   ESRSEDRATE 126 (H) 03/24/2020   CRP 29.8 (H) 12/10/2020   LABURIC 9.1 (H) 12/15/2020   REPTSTATUS 12/22/2020 FINAL 12/21/2020   GRAMSTAIN  11/07/2019    NO WBC SEEN ABUNDANT GRAM POSITIVE COCCI FEW GRAM POSITIVE RODS    CULT (A) 12/21/2020    30,000 COLONIES/mL MULTIPLE SPECIES PRESENT, SUGGEST RECOLLECTION   LABORGA STAPHYLOCOCCUS AUREUS 11/07/2019     Lab Results  Component  Value Date   ALBUMIN 2.4 (L) 05/21/2021   ALBUMIN 2.0 (L) 03/06/2021   ALBUMIN 2.0 (L) 03/05/2021   PREALBUMIN 5.5 (L) 12/10/2020    Lab Results  Component Value Date   MG 1.9 02/28/2021   MG 1.8 02/27/2021   MG 2.0 12/14/2020   No results found for: VD25OH  Lab Results  Component Value Date   PREALBUMIN 5.5 (L) 12/10/2020   CBC EXTENDED Latest Ref Rng & Units 03/05/2021 03/03/2021 03/01/2021  WBC 4.0 - 10.5 K/uL 6.8 7.2 6.1  RBC 4.22 - 5.81 MIL/uL 3.15(L) 3.33(L) 3.03(L)  HGB 13.0 - 17.0 g/dL 8.8(L) 9.4(L) 8.5(L)  HCT 39.0 - 52.0 % 28.9(L) 31.3(L) 27.6(L)  PLT 150 - 400 K/uL 241 221 209  NEUTROABS 1.7 - 7.7 K/uL - - -  LYMPHSABS 0.7 - 4.0 K/uL - - -     There is no height or weight on file to calculate BMI.  Orders:  No orders of the defined types were placed in this encounter.  Meds ordered this encounter  Medications   oxyCODONE (OXY IR/ROXICODONE) 5 MG immediate release tablet    Sig: Take 1 tablet (5 mg total) by mouth every 12 (twelve) hours as needed for moderate pain (pain score 4-6).    Dispense:  30 tablet    Refill:  0   pregabalin (LYRICA) 50 MG capsule    Sig: Take 1 capsule (50 mg total) by mouth 2 (two) times daily.    Dispense:  180 capsule    Refill:  3     Procedures: No procedures performed  Clinical Data: No additional findings.  ROS:  All other systems negative, except as noted in the HPI. Review of Systems  Objective: Vital Signs: There were no vitals taken for this visit.  Specialty Comments:  No specialty comments available.  PMFS History: Patient Active Problem List   Diagnosis Date Noted   Chronic combined systolic (congestive) and diastolic (congestive) heart failure (HCC) 04/07/2021   Chronic renal disease, stage 4, severely decreased glomerular filtration rate between 15-29 mL/min/1.73 square meter (HCC)    Hypertensive urgency    AKI (acute kidney injury) ([place])    Acute CHF (congestive heart failure) ([place]) 02/27/2021    Normocytic anemia 02/27/2021   Anasarca 02/27/2021   Abscess of left foot 12/10/2020   S/P BKA (below knee amputation) unilateral, left ([place]) 12/10/2020   Dyslipidemia 12/10/2020   CKD (chronic kidney disease), stage III ([place]) 12/10/2020   Subacute osteomyelitis, left ankle and foot (HCC)    Osteomyelitis of fifth toe of left foot (HCC)    Severe protein-calorie malnutrition ([place])    HTN (hypertension) 11/12/2020   Osteomyelitis of second toe of right foot ([place])    Cellulitis and abscess of foot    Toe osteomyelitis ([place]) 03/24/2020   Necrotizing fasciitis ([place]) 09/08/2014   Diabetes mellitus with hyperglycemia ([place]) 09/07/2014   Tobacco abuse 09/07/2014   Abscess of back    Abscess of lower back 09/06/2014   DM2 (diabetes mellitus, type 2) ([place]) 09/06/2014   Sepsis ([place]) 09/06/2014   Scrotal abscess 06/20/2014   Past Medical History:  Diagnosis Date   Anemia    Anemia    Anxiety    Chronic combined systolic (congestive) and diastolic (congestive) heart failure (HCC)    EF 40-45% with G3DD on echo 2022   CKD (chronic kidney disease), stage IV ([place]) 12/10/2020   Diabetes mellitus with complication (HCC)    Diabetic neuropathy (HCC)    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[place]) 03/25/2020   pt states retinal eye damage- left worse than the right- recent visited MD    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[place]) 03/25/2020   pt states recent MD visit /left eye worse than right   Dyslipidemia 12/10/2020   Hx of BKA, left ([place])    Hypertension    Morbid obesity ([place])    Osteomyelitis ([place])    Pneumonia     Family History  Problem Relation Age of Onset   Diabetes Mother    Diabetes Father    Diabetes Brother    Colon cancer Neg Hx    Esophageal cancer Neg Hx    Pancreatic cancer Neg Hx    Stomach cancer Neg Hx    Liver disease Neg Hx    CAD Neg Hx     Past Surgical History:  Procedure Laterality Date   ABSCESS DRAINAGE     neck   AMPUTATION Left 11/14/2020    Procedure: LEFT 5TH RAY AMPUTATION;  Surgeon: Newt Minion, MD;  Location: [facility];  Service: Orthopedics;  Laterality: Left;   AMPUTATION Left 12/10/2020   Procedure: LEFT BELOW KNEE AMPUTATION;  Surgeon: Newt Minion, MD;  Location: [facility];  Service: Orthopedics;  Laterality: Left;   AMPUTATION TOE Right 03/25/2020  Procedure: AMPUTATION TOE;  Surgeon: Evelina Bucy, DPM;  [facility];  Service: Podiatry;  Laterality: Right;   INCISION AND DRAINAGE ABSCESS Right 09/07/2014   Procedure: INCISION AND DRAINAGE ABSCESS RIGHT FLANK;  Surgeon: Jackolyn Confer, MD;  [facility];  Service: General;  Laterality: Right;   LEG AMPUTATION BELOW KNEE Left 12/10/2020   SCROTUM EXPLORATION     Social History   Occupational History   Not on file  Tobacco Use   Smoking status: Every Day    Packs/day: 1.00    Years: 32.00    Pack years: 32.00    Types: Cigarettes   Smokeless tobacco: Never   Tobacco comments:    Currently down to 1/2 ppd  Vaping Use   Vaping Use: Never used  Substance and Sexual Activity   Alcohol use: No   Drug use: No   Sexual activity: Not on file

## 2021-06-19 ENCOUNTER — Ambulatory Visit: Payer: Medicaid Other

## 2021-06-23 ENCOUNTER — Ambulatory Visit: Payer: Medicaid Other | Admitting: Family Medicine

## 2021-06-23 ENCOUNTER — Telehealth: Payer: Self-pay | Admitting: *Deleted

## 2021-06-23 NOTE — Telephone Encounter (Addendum)
Patient contacted regarding [facility] Sleep Study. Informed patient that [facility] Sleep Study needs to be completed or returned within 2 weeks of today's date. Made patient aware that if the sleep study has not been done within this timeframe and the device has not been returned, that this will be handed over to billing per the signed waiver agreement. Patient verbalized understanding and thanked me for the call.   Pt states he will proceed with sleep study in the few days of this week. Pt thanked me for the call.

## 2021-06-24 ENCOUNTER — Ambulatory Visit: Payer: Medicaid Other

## 2021-06-26 ENCOUNTER — Ambulatory Visit: Payer: Medicaid Other

## 2021-06-30 ENCOUNTER — Ambulatory Visit: Payer: Medicaid Other | Admitting: Physical Therapy

## 2021-07-01 ENCOUNTER — Ambulatory Visit: Payer: Medicaid Other | Admitting: Family Medicine

## 2021-07-03 ENCOUNTER — Ambulatory Visit: Payer: Medicaid Other | Admitting: Physical Therapy

## 2021-07-05 DIAGNOSIS — Z419 Encounter for procedure for purposes other than remedying health state, unspecified: Secondary | ICD-10-CM | POA: Diagnosis not present

## 2021-07-07 ENCOUNTER — Telehealth: Payer: Self-pay | Admitting: Family Medicine

## 2021-07-07 NOTE — Telephone Encounter (Signed)
.. °  Medicaid Managed Care   Unsuccessful Outreach Note  07/07/2021 Name: Todd Mccoy MRN: [id number] DOB: 02-Mar-1971  Referred by: [name], MD Reason for referral : High Risk Managed Medicaid (I called the patient today to get him scheduled with the [facility] Team. I left my name and number on his VM.)   An unsuccessful telephone outreach was attempted today. The patient was referred to the case management team for assistance with care management and care coordination.   Follow Up Plan: The care management team will reach out to the patient again over the next 14 days.    [place]

## 2021-07-08 ENCOUNTER — Encounter: Payer: Self-pay | Admitting: *Deleted

## 2021-07-08 ENCOUNTER — Ambulatory Visit: Payer: Medicaid Other

## 2021-07-08 NOTE — Telephone Encounter (Signed)
Letter sent to the pt today in regard to [facility] Sleep Study.

## 2021-07-10 ENCOUNTER — Ambulatory Visit: Payer: Medicaid Other

## 2021-07-22 DIAGNOSIS — I5042 Chronic combined systolic (congestive) and diastolic (congestive) heart failure: Secondary | ICD-10-CM

## 2021-07-28 ENCOUNTER — Other Ambulatory Visit: Payer: Self-pay

## 2021-07-28 ENCOUNTER — Other Ambulatory Visit: Payer: Self-pay | Admitting: Family Medicine

## 2021-07-28 ENCOUNTER — Telehealth: Payer: Self-pay | Admitting: Family Medicine

## 2021-07-28 DIAGNOSIS — G4709 Other insomnia: Secondary | ICD-10-CM

## 2021-07-28 NOTE — Telephone Encounter (Signed)
.. °  Medicaid Managed Care   Unsuccessful Outreach Note  07/28/2021 Name: Todd Mccoy MRN: [id number] DOB: 05-19-1971  Referred by: [name], MD Reason for referral : High Risk Managed Medicaid (I called the patient today to get him scheduled with the MM Team. I left my name and number on his VM.)   A second unsuccessful telephone outreach was attempted today. The patient was referred to the case management team for assistance with care management and care coordination.   Follow Up Plan: The care management team will reach out to the patient again over the next 14 days.   [place]

## 2021-07-28 NOTE — Telephone Encounter (Signed)
Requested Prescriptions  Pending Prescriptions Disp Refills   hydrOXYzine (ATARAX) 25 MG tablet [Pharmacy Med Name: hydrOXYzine HCl 25 MG Oral Tablet] 30 tablet 0    Sig: TAKE 1 TABLET BY MOUTH AT BEDTIME AS NEEDED     Ear, Nose, and Throat:  Antihistamines Passed - 07/28/2021  9:21 AM      Passed - Valid encounter within last 12 months    Recent Outpatient Visits          4 months ago Other insomnia   [place], [place], MD   5 months ago 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 complication, unspecified whether long term insulin use ([place])   [place], [place], MD   6 months ago 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 complication, unspecified whether long term insulin use ([place])   [place], [place], MD   9 months ago 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 complication, unspecified whether long term insulin use ([place])   [place], [place], MD   1 year ago 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 complication, unspecified whether long term insulin use [facility])   [place], RPH-CPP      Future Appointments            In 2 months Turner, Eber Hong, MD [place] [facility]

## 2021-07-30 NOTE — Telephone Encounter (Signed)
Handed over to [place], [facility] Supervisor to send to billing.

## 2021-08-05 DIAGNOSIS — Z419 Encounter for procedure for purposes other than remedying health state, unspecified: Secondary | ICD-10-CM | POA: Diagnosis not present

## 2021-08-18 ENCOUNTER — Ambulatory Visit (HOSPITAL_COMMUNITY)
Admission: EM | Admit: 2021-08-18 | Discharge: 2021-08-18 | Disposition: A | Discharge status: Home / Self Care | Payer: Medicaid Other | Attending: Internal Medicine | Admitting: Internal Medicine

## 2021-08-18 ENCOUNTER — Encounter (HOSPITAL_COMMUNITY): Payer: Self-pay

## 2021-08-18 ENCOUNTER — Other Ambulatory Visit: Payer: Self-pay

## 2021-08-18 DIAGNOSIS — L97511 Non-pressure chronic ulcer of other part of right foot limited to breakdown of skin: Secondary | ICD-10-CM | POA: Diagnosis not present

## 2021-08-18 DIAGNOSIS — E11621 Type 2 diabetes mellitus with foot ulcer: Secondary | ICD-10-CM

## 2021-08-18 MED ORDER — DOXYCYCLINE HYCLATE 100 MG PO CAPS: 100.0000 mg | ORAL_CAPSULE | Freq: Two times a day (BID) | ORAL | 0 refills | Status: DC

## 2021-08-18 NOTE — ED Triage Notes (Signed)
Pt presents with a cut on bottom of right foot that he noticed yesterday; pt does having feeling or sensation to that foot so he is unsure of how long It was there.

## 2021-08-18 NOTE — ED Provider Notes (Signed)
[place]    CSN: [id number] Arrival date & time: 08/18/21  1307      History   Chief Complaint Chief Complaint  Patient presents with   Puncture Wound    HPI Todd Mccoy is a 51 y.o. male who presents with a wound on the lateral  R foot he noticed yesterday. He does not know when or how that happened. He is a diabetic and his fasting glucose ran from 150-160's. 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 He denies fever, chills or sweats, or feeling bad in any way. Admits he sleeps sitting up most night since his L leg phantom pain keeps him awake. Admits he has swelling of R foot.     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[place]) 03/25/2020   pt states retinal eye damage- left worse than the right- recent visited MD    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[place]) 03/25/2020   pt states recent MD visit /left eye worse than right   Dyslipidemia 12/10/2020   Hx of BKA, left ([place])    Hypertension    Morbid obesity ([place])    Osteomyelitis ([place])    Pneumonia     Patient Active Problem List   Diagnosis Date Noted   Chronic combined systolic (congestive) and diastolic (congestive) heart failure (Todd Mccoy) 04/07/2021   Chronic renal disease, stage 4, severely decreased glomerular filtration rate between 15-29 mL/min/1.73 square meter (HCC)    Hypertensive urgency    AKI (acute kidney injury) ([place])    Acute CHF (congestive heart failure) ([place]) 02/27/2021   Normocytic anemia 02/27/2021   Anasarca 02/27/2021   Abscess of left foot 12/10/2020   S/P BKA (below knee amputation) unilateral, left ([place])  12/10/2020   Dyslipidemia 12/10/2020   CKD (chronic kidney disease), stage III ([place]) 12/10/2020   Subacute osteomyelitis, left ankle and foot (HCC)    Osteomyelitis of fifth toe of left foot ([place])    Severe protein-calorie malnutrition (Todd Mccoy)    HTN (hypertension) 11/12/2020   Osteomyelitis of second toe of right foot ([place])    Cellulitis and abscess of foot    Toe osteomyelitis ([place]) 03/24/2020   Necrotizing fasciitis ([place]) 09/08/2014   Diabetes mellitus with hyperglycemia ([place]) 09/07/2014   Tobacco abuse 09/07/2014   Abscess of back    Abscess of lower back 09/06/2014   DM2 (diabetes mellitus, type 2) ([place]) 09/06/2014   Sepsis ([place]) 09/06/2014   Scrotal abscess 06/20/2014    Past Surgical History:  Procedure Laterality Date   ABSCESS DRAINAGE     neck   AMPUTATION Left 11/14/2020   Procedure: LEFT 5TH RAY AMPUTATION;  Surgeon: Newt Minion, MD;  Location: [facility];  Service: Orthopedics;  Laterality: Left;   AMPUTATION Left 12/10/2020   Procedure: LEFT BELOW KNEE AMPUTATION;  Surgeon: Newt Minion, MD;  Location: [facility];  Service: Orthopedics;  Laterality: Left;   AMPUTATION TOE Right 03/25/2020   Procedure: AMPUTATION TOE;  Surgeon: Evelina Bucy, DPM;  Location: [facility];  Service: Podiatry;  Laterality: Right;  INCISION AND DRAINAGE ABSCESS Right 09/07/2014   Procedure: INCISION AND DRAINAGE ABSCESS RIGHT FLANK;  Surgeon: Jackolyn Confer, MD;  Location: [facility];  Service: General;  Laterality: Right;   LEG AMPUTATION BELOW KNEE Left 12/10/2020   SCROTUM EXPLORATION         Home Medications    Prior to Admission medications   Medication Sig Start Date End Date Taking? Authorizing Provider  doxycycline (VIBRAMYCIN) 100 MG capsule Take 1 capsule (100 mg total) by mouth 2 (two) times daily. 08/18/21  Yes Rodriguez-Southworth, Sunday Spillers, PA-C  acetaminophen (TYLENOL) 325 MG tablet Take 650 mg by mouth every 6 (six) hours as needed for mild pain.    [provider]   amLODipine (NORVASC) 10 MG tablet Take 1 tablet (10 mg total) by mouth daily. 05/21/21   [name], MD  atorvastatin (LIPITOR) 20 MG tablet Take 1 tablet (20 mg total) by mouth daily. 04/23/21   [name], MD  carvedilol (COREG) 25 MG tablet Take 1 tablet (25 mg total) by mouth 2 (two) times daily. 05/21/21   [name], MD  ferrous sulfate 325 (65 FE) MG tablet Take 1 tablet (325 mg total) by mouth 3 (three) times daily with meals. Patient taking differently: Take 325 mg by mouth in the morning and at bedtime. 12/19/20 04/11/21  ShahmehdiValeria Batman, MD  furosemide (LASIX) 80 MG tablet Take 1 tablet (80 mg total) by mouth daily. 05/21/21   [name], MD  hydrALAZINE (APRESOLINE) 100 MG tablet Take 1 tablet (100 mg total) by mouth 3 (three) times daily. 03/06/21   Kathie Dike, MD  hydrOXYzine (ATARAX) 25 MG tablet TAKE 1 TABLET BY MOUTH AT BEDTIME AS NEEDED 07/28/21   [name], MD  insulin glargine (LANTUS) 100 UNIT/ML Solostar Pen Inject 10 Units into the skin at bedtime. 03/06/21 03/06/22  Kathie Dike, MD  Insulin Pen Needle 32G X 4 MM MISC USE TO INJECT INSULIN DAILY. 12/19/20 12/19/21  Deatra James, MD  isosorbide mononitrate (IMDUR) 60 MG 24 hr tablet Take 1 tablet (60 mg total) by mouth daily. 05/21/21   [name], MD  Naphazoline HCl (CLEAR EYES OP) Place 1 drop into both eyes as needed (irritation).    [provider]  oxyCODONE (OXY IR/ROXICODONE) 5 MG immediate release tablet Take 1 tablet (5 mg total) by mouth every 12 (twelve) hours as needed for moderate pain (pain score 4-6). 06/17/21   Suzan Slick, NP  polyethylene glycol powder (GLYCOLAX/MIRALAX) 17 GM/SCOOP powder Take 17 g by mouth 2 (two) times daily as needed. 01/21/21   [name], MD  pregabalin (LYRICA) 50 MG capsule Take 1 capsule (50 mg total) by mouth 2 (two) times daily. 06/17/21   Suzan Slick, NP  insulin aspart protamine- aspart (NOVOLOG MIX 70/30) (70-30) 100  UNIT/ML injection Inject 0.35 mLs (35 Units total) into the skin 2 (two) times daily. 04/17/20 05/13/20  Argentina Donovan, PA-C  losartan-hydrochlorothiazide (HYZAAR) 100-25 MG tablet TAKE 1 TABLET BY MOUTH DAILY. 12/19/20 12/22/20  Shahmehdi, Valeria Batman, MD  metFORMIN (GLUCOPHAGE-XR) 500 MG 24 hr tablet TAKE 1 TABLET (500 MG TOTAL) BY MOUTH 2 (TWO) TIMES DAILY. 12/19/20 12/22/20  Deatra James, MD    Family History Family History  Problem Relation Age of Onset   Diabetes Mother    Diabetes Father    Diabetes Brother    Colon cancer Neg Hx    Esophageal cancer Neg Hx    Pancreatic cancer  Neg Hx    Stomach cancer Neg Hx    Liver disease Neg Hx    CAD Neg Hx     Social History Social History   Tobacco Use   Smoking status: Every Day    Packs/day: 1.00    Years: 32.00    Pack years: 32.00    Types: Cigarettes   Smokeless tobacco: Never   Tobacco comments:    Currently down to 1/2 ppd  Vaping Use   Vaping Use: Never used  Substance Use Topics   Alcohol use: No   Drug use: No     Allergies   Patient has no known allergies.   Review of Systems Review of Systems  Constitutional:  Negative for chills, diaphoresis and fever.  Eyes:        He is legally blind  Cardiovascular:  Positive for leg swelling.  Musculoskeletal:  Positive for gait problem.  Skin:  Positive for wound.  Neurological:  Positive for numbness.    Physical Exam Triage Vital Signs ED Triage Vitals  Enc Vitals Group     BP 08/18/21 1401 (!) 160/89     Pulse Rate 08/18/21 1401 77     Resp 08/18/21 1401 17     Temp 08/18/21 1401 98.2 F (36.8 C)     Temp Source 08/18/21 1401 Oral     SpO2 08/18/21 1401 98 %     Weight --      Height --      Head Circumference --      Peak Flow --      Pain Score 08/18/21 1400 0     Pain Loc --      Pain Edu? --      Excl. in [place]? --    No data found.  Updated Vital Signs BP (!) 160/89 (BP Location: Left Arm)    Pulse 77    Temp 98.2 F (36.8 C) (Oral)     Resp 17    SpO2 98%   Visual Acuity Right Eye Distance:   Left Eye Distance:   Bilateral Distance:    Right Eye Near:   Left Eye Near:    Bilateral Near:     Physical Exam Vitals and nursing note reviewed.  Constitutional:      General: He is not in acute distress.    Appearance: He is not toxic-appearing.     Comments: He sits on a wheelchair  Eyes:     General: No scleral icterus.    Conjunctiva/sclera: Conjunctivae normal.  Cardiovascular:     Comments: I was not able to palpate R foot pulses. Has +2 pitting edema of  Rlower leg, ankle and foot.  Pulmonary:     Effort: Pulmonary effort is normal.  Musculoskeletal:        General: Normal range of motion.     Cervical back: Neck supple.  Skin:    Comments: His right foot is cold, I am unable to blanch any of his foot to check for capillary refill. On his toes is > 3 seconds. Has 1.5 cm x 1.5 cm ulcer on R mid lateral foot that does not look it has reached the fat. The center is necrotic. Is a little warmer than the rest of his foot. He does not feel pain with palpation. Has a linear abrasion that is healing on his mid dorsal foot.   Neurological:     Mental Status: He is alert.     Sensory: Sensory deficit  present.     Gait: Gait abnormal.  Psychiatric:        Mood and Affect: Mood normal.        Behavior: Behavior normal.        Thought Content: Thought content normal.        Judgment: Judgment normal.     UC Treatments / Results  Labs (all labs ordered are listed, but only abnormal results are displayed) Labs Reviewed - No data to display  EKG   Radiology No results found.  Procedures Procedures (including critical care time)  Medications Ordered in UC Medications - No data to display  Initial Impression / Assessment and Plan / UC Course  I have reviewed the triage vital signs and the nursing notes. Diabetic foot ulcer Was placed on Doxy to cover for infection Needs to FU with wound clinic and he  will go right now and make an appointment. Has been there before.  Final Clinical Impressions(s) / UC Diagnoses   Final diagnoses:  Ulcer of right foot, limited to breakdown of skin [facility])     Discharge Instructions      I am placing you on an antibiotic to prevent an infection Call the Wound clinic today, so they can care for it.      ED Prescriptions     Medication Sig Dispense Auth. Provider   doxycycline (VIBRAMYCIN) 100 MG capsule Take 1 capsule (100 mg total) by mouth 2 (two) times daily. 14 capsule Rodriguez-Southworth, Sunday Spillers, PA-C      PDMP not reviewed this encounter.   Shelby Mattocks, [place] 08/18/21 1854

## 2021-08-18 NOTE — Discharge Instructions (Signed)
I am placing you on an antibiotic to prevent an infection Call the Wound clinic today, so they can care for it.

## 2021-09-01 ENCOUNTER — Other Ambulatory Visit: Payer: Self-pay | Admitting: Family Medicine

## 2021-09-01 DIAGNOSIS — G4709 Other insomnia: Secondary | ICD-10-CM

## 2021-09-02 DIAGNOSIS — Z419 Encounter for procedure for purposes other than remedying health state, unspecified: Secondary | ICD-10-CM | POA: Diagnosis not present

## 2021-09-08 ENCOUNTER — Encounter (HOSPITAL_BASED_OUTPATIENT_CLINIC_OR_DEPARTMENT_OTHER): Payer: Medicaid Other | Attending: Internal Medicine | Admitting: Internal Medicine

## 2021-09-08 ENCOUNTER — Other Ambulatory Visit: Payer: Self-pay

## 2021-09-08 DIAGNOSIS — Z89512 Acquired absence of left leg below knee: Secondary | ICD-10-CM | POA: Insufficient documentation

## 2021-09-08 DIAGNOSIS — I5042 Chronic combined systolic (congestive) and diastolic (congestive) heart failure: Secondary | ICD-10-CM | POA: Diagnosis not present

## 2021-09-08 DIAGNOSIS — N184 Chronic kidney disease, stage 4 (severe): Secondary | ICD-10-CM | POA: Insufficient documentation

## 2021-09-08 DIAGNOSIS — I132 Hypertensive heart and chronic kidney disease with heart failure and with stage 5 chronic kidney disease, or end stage renal disease: Secondary | ICD-10-CM | POA: Diagnosis not present

## 2021-09-08 DIAGNOSIS — L97512 Non-pressure chronic ulcer of other part of right foot with fat layer exposed: Secondary | ICD-10-CM | POA: Insufficient documentation

## 2021-09-08 DIAGNOSIS — Z794 Long term (current) use of insulin: Secondary | ICD-10-CM | POA: Diagnosis not present

## 2021-09-08 DIAGNOSIS — E11621 Type 2 diabetes mellitus with foot ulcer: Secondary | ICD-10-CM | POA: Insufficient documentation

## 2021-09-08 NOTE — Progress Notes (Signed)
[name], Mccoy ([id number]) Visit Report for 09/08/2021 Chief Complaint Document Details Patient Name: Date of Service: Todd Mccoy NA THA N S. 09/08/2021 9:00 A M Medical Record Number: [id number] Patient Account Number: [id number] Date of Birth/Sex: Treating RN: 09/07/1970 (51 y.o. Male) Primary Care Provider: [name] Other Clinician: Referring Provider: Treating Provider/Extender: Durward Fortes Weeks in Treatment: 0 Information Obtained from: Patient Chief Complaint 11/20/2019; patient is here with for review of multiple wounds over the right lower leg, right great and right second toe 30/01/2022; patient presents for wound to the right lateral foot Electronic Signature(s) Signed: 09/08/2021 4:29:59 PM By: [name] DO Entered By: [name] on 09/08/2021 16:15:23 -------------------------------------------------------------------------------- HPI Details Patient Name: Date of Service: Todd Mccoy NA THA N S. 09/08/2021 9:00 A M Medical Record Number: [id number] Patient Account Number: [id number] Date of Birth/Sex: Treating RN: 1970-11-15 (51 y.o. Male) Primary Care Provider: [name] Other Clinician: Referring Provider: Treating Provider/Extender: Durward Fortes Weeks in Treatment: 0 History of Present Illness HPI Description: ADMISSION 11/20/2019; this is a 51 year old man with poorly controlled type 2 diabetes. Roughly 3 to 4 weeks ago he noted swelling of his leg open wounds with draining sores. This happened fairly suddenly. He was seen in the ER on 11/07/2019 noted to have wounds of the right lower extremity x2 weeks at that point. An x-ray of the area was negative one of the wounds was cultured showing MSSA and group B strep. He was noted to have multiple wounds. He was given a course of doxycycline for 7 days white count was 8.4 hemoglobin 9.7. Since then he has been washing the leg and applying dry gauze. He  is here in the clinic for review of this. 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 At that point his culture was MRSA. No no 11/27/19-Patient returns after starting in the clinic for open leg wounds on his right and his right second toe. We have been applying silver alginate and 3 layer compression on the right. Patient completed course of doxycycline. The wounds are looking slightly smaller, the right posterior wound appears to have healed 6/8; patient is here in follow-up for open wounds on his right leg and his right second toe. X-ray I did last time was strangely negative. I gave him a course of Keflex which she is completed the area on the tip of the second toe is actually close over. All of the wounds on his right anterior lateral and posterior leg are considerably better. There is still open wounds on the right lateral and right posterior but they are measuring better. These were felt to be abscesses for which she was hospitalized in early May. Cultures originally showed MSSA and group B strep 6/25; patient is here for review of wounds on the right lower leg which were felt to be abscesses. He also had an area on his left second toe when he came in this is closed over. Almost all the areas on the right leg are closed 2 small open areas remain. We use silver alginate to these wounds and let them wear his own stockings. Should be closed the next time he is here Readmission 09/08/2021 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 Chronic kidney disease stage IV and combined chronic systolic and diastolic heart failure that presents to the clinic for a 1 month history of open wound to the right lateral foot. He states he visited the ED on  08/18/2021 for this issue and was prescribed doxycycline. He states that the wound became slightly smaller after taking this. He has been using AandE ointment to the wound bed. He denies signs of infection. Electronic Signature(s) Signed: 09/08/2021 4:29:59 PM By: [name] DO Entered By: [name] on 09/08/2021 16:17:51 -------------------------------------------------------------------------------- Physical Exam Details Patient Name: Date of Service: Todd Mccoy NA THA N S. 09/08/2021 9:00 A M Medical Record Number: [id number] Patient Account Number: [id number] Date of Birth/Sex: Treating RN: May 28, 1971 (51 y.o. Male) Primary Care Provider: [name] Other Clinician: Referring Provider: Treating Provider/Extender: Caroline More, [name] Weeks in Treatment: 0 Constitutional respirations regular, non-labored and within target range for patient.. Cardiovascular 2+ dorsalis pedis/posterior tibialis pulses. Psychiatric pleasant and cooperative. Notes Right foot: to the lateral aspect there is an open wound with scant thick yellow drainage. Surrounding skin with no increased warmth or erythema. Electronic Signature(s) Signed: 09/08/2021 4:29:59 PM By: [name] DO Entered By: [name] on 09/08/2021 16:24:36 -------------------------------------------------------------------------------- Physician Orders Details Patient Name: Date of Service: Todd Mccoy NA THA N S. 09/08/2021 9:00 A M Medical Record Number: [id number] Patient Account Number: [id number] Date of Birth/Sex: Treating RN: June 05, 1971 (51 y.o. Male) Deon Pilling Primary Care Provider: [name] Other Clinician: Referring Provider: Treating Provider/Extender: Durward Fortes Weeks in Treatment: 0 Verbal / Phone Orders: No Diagnosis Coding Follow-up Appointments ppointment in 1 week. - Dr. Heber [facility] Thursday 09/17/2021 Return A Other: - ****Pick up  topical gentamicin ointment at your local pharmacy.**** Pick up callous pads to apply over the wound for dressing changes. Edema Control - Lymphedema / SCD / Other Elevate legs to the level of the heart or above for 30 minutes daily and/or when sitting, a frequency of: - 3-4 times a day throughout the day. Avoid standing for long periods of time. Moisturize legs daily. - every night before bed. Off-Loading Open toe surgical shoe to: - to aid in offloading pressure to lateral foot- wear while standing or pivoting or using the wheelchair, along with callous pads. Wound Treatment Wound #8 - Foot Wound Laterality: Right, Lateral Cleanser: Soap and Water 1 x Per Day/30 Days Discharge Instructions: May shower and wash wound with dial antibacterial soap and water prior to dressing change. Topical: Gentamicin 1 x Per Day/30 Days Discharge Instructions: Apply directly to wound bed. Secondary Dressing: Optifoam Non-Adhesive Dressing, 4x4 in 1 x Per Day/30 Days Discharge Instructions: foam donut in clinic. Secondary Dressing: Callous pads 1 x Per Day/30 Days Discharge Instructions: apply callous pads to periwound. change every day. Secured With: Child psychotherapist, Sterile 2x75 (in/in) 1 x Per Day/30 Days Discharge Instructions: Secure with stretch gauze as directed. Secured With: 54M Medipore H Soft Cloth Surgical T ape, 4 x 10 (in/yd) 1 x Per Day/30 Days Discharge Instructions: Secure with tape as directed. Laboratory naerobe culture (MICRO) - someone will call you with results once it returns. Bacteria identified in Unspecified specimen by A LOINC Code: 638-9 Convenience Name: Anaerobic culture Patient Medications llergies: No Known Allergies A Notifications Medication Indication Start End lidocaine DOSE topical 4 % gel - gel topical applied in clinic for debridements only 09/08/2021 gentamicin DOSE 1 - topical 0.1 % cream - Apply daily to the wound  09/08/2021 doxycycline  hyclate DOSE 1 - oral 100 mg tablet - 1 tablet oral BID x 10 days Electronic Signature(s) Signed: 09/08/2021 4:28:42 PM By: [name] DO Previous Signature: 09/08/2021 10:02:42 AM Version By: [name] DO Entered By: [name] on 09/08/2021 16:28:41 Prescription 09/08/2021 -------------------------------------------------------------------------------- [name]. [name] DO Patient Name: Provider: Nov 18, 1970 [id number] Date of Birth: NPI#: Male [id number] Sex: DEA #: [phone] Phone #: License #: [place] Patient Address: [place] [place], [place] [address] [address], [place] [address] [phone] Allergies No Known Allergies Medication Medication: Route: Strength: Form: lidocaine 4 % topical gel topical 4% gel Class: TOPICAL LOCAL ANESTHETICS Dose: Frequency / Time: Indication: gel topical applied in clinic for debridements only Number of Refills: Number of Units: 0 Generic Substitution: Start Date: End Date: One Time Use: Substitution Permitted No Note to Pharmacy: Hand Signature: Date(s): Electronic Signature(s) Signed: 09/08/2021 4:29:59 PM By: [name] DO Entered By: [name] on 09/08/2021 16:28:42 -------------------------------------------------------------------------------- Problem List Details Patient Name: Date of Service: Todd Mccoy NA THA N S. 09/08/2021 9:00 A M Medical Record Number: [id number] Patient Account Number: [id number] Date of Birth/Sex: Treating RN: 1970-08-14 (51 y.o. Male) Primary Care Provider: [name] Other Clinician: Referring Provider: Treating Provider/Extender: Durward Fortes Weeks in Treatment: 0 Active Problems ICD-10 Encounter Code Description Active Date MDM Diagnosis L97.512 Non-pressure chronic ulcer of other part of right foot with fat layer exposed  09/08/2021 No Yes E11.621 Type 2 diabetes mellitus with foot ulcer 09/08/2021 No Yes Z89.512 Acquired absence of left leg below knee 09/08/2021 No Yes N18.4 Chronic kidney disease, stage 4 (severe) 09/08/2021 No Yes Inactive Problems Resolved Problems Electronic Signature(s) Signed: 09/08/2021 4:29:59 PM By: [name] DO Entered By: [name] on 09/08/2021 16:13:12 -------------------------------------------------------------------------------- Progress Note Details Patient Name: Date of Service: Todd Mccoy NA THA N S. 09/08/2021 9:00 A M Medical Record Number: [id number] Patient Account Number: [id number] Date of Birth/Sex: Treating RN: [birth year] (51 y.o. Male) Primary Care Provider: [name] Other Clinician: Referring Provider: Treating Provider/Extender: Durward Fortes Weeks in Treatment: 0 Subjective Chief Complaint Information obtained from Patient 11/20/2019; patient is here with for review of multiple wounds over the right lower leg, right great and right second toe 30/01/2022; patient presents for wound to the right lateral foot History of Present Illness (HPI) ADMISSION 11/20/2019; this is a 51 year old man with poorly controlled type 2 diabetes. Roughly 3 to 4 weeks ago he noted swelling of his leg open wounds with draining sores. This happened fairly suddenly. He was seen in the ER on 11/07/2019 noted to have wounds of the right lower extremity x2 weeks at that point. An x-ray of the area was negative one of the wounds was cultured showing MSSA and group B strep. He was noted to have multiple wounds. He was given a course of doxycycline for 7 days white count was 8.4 hemoglobin 9.7. Since then he has been washing the leg and applying dry gauze. He is here in the clinic for review of this. 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 At that point his culture was MRSA. No no 11/27/19-Patient returns after starting in the clinic for open leg wounds on his right and his right second toe. We have  been applying silver alginate and 3 layer compression on the right. Patient completed course of doxycycline. The wounds are looking slightly smaller, the right posterior wound appears to have healed 6/8; patient is here in follow-up for open wounds on his right leg and his right second toe. X-ray I did last time was strangely negative. I gave him a course of Keflex which she is completed the area on the tip of the second toe is actually close over. All of the wounds on his right anterior lateral and posterior leg are considerably better. There is still open wounds on the right lateral and right posterior but they are measuring better. These were felt to be abscesses for which she was hospitalized in early May. Cultures originally showed MSSA and group B strep 6/25; patient is here for review of wounds on the right lower leg which were felt to be abscesses. He also had an area on his left second toe when he came in this is closed over. Almost all the areas on the right leg are closed 2 small open areas remain. We use silver alginate to these wounds and let them wear his own stockings. Should be closed the next time he is here Readmission 09/08/2021 [name] is a 51 year old male with a past medical history of type 2 diabetes on insulin, left below the knee amputation secondary to osteomyelitis. Chronic kidney disease stage IV and combined chronic systolic and diastolic heart failure that presents to the clinic for a 1 month history of open wound to the right lateral foot. He states he visited the ED on 08/18/2021 for this issue and was prescribed doxycycline. He states that the wound became slightly smaller after taking this. He has been using AandE ointment to the wound bed. He denies signs of  infection. Patient History Information obtained from Patient. Allergies No Known Allergies Family History Diabetes - Mother, Hypertension - Mother, No family history of Cancer, Heart Disease, Hereditary Spherocytosis, Kidney Disease, Lung Disease, Seizures, Stroke, Thyroid Problems, Tuberculosis. Social History Current every day smoker - 1/2 ppd, Marital Status - Single, Alcohol Use - Never, Drug Use - No History, Caffeine Use - Daily - soda, tea. Medical History Eyes Denies history of Cataracts, Glaucoma, Optic Neuritis Ear/Nose/Mouth/Throat Denies history of Chronic sinus problems/congestion, Middle ear problems Hematologic/Lymphatic Patient has history of Anemia Denies history of Hemophilia, Human Immunodeficiency Virus, Lymphedema, Sickle Cell Disease Cardiovascular Patient has history of Congestive Heart Failure - EF 40-45%, Hypertension Denies history of Angina, Arrhythmia, Coronary Artery Disease, Deep Vein Thrombosis, Hypotension, Myocardial Infarction, Peripheral Arterial Disease, Peripheral Venous Disease, Phlebitis, Vasculitis Gastrointestinal Denies history of Cirrhosis , Colitis, Crohnoos, Hepatitis A, Hepatitis B, Hepatitis C Endocrine Patient has history of Type II Diabetes Denies history of Type I Diabetes Genitourinary 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 -  scrotum exploration. - Left BKA 5/22. Medical A Surgical History Notes nd Eyes retina detachment surgery- right eye legal blind- blurry vision Review of Systems (ROS) Constitutional Symptoms (General Health) Denies complaints or symptoms of Fatigue, Fever, Chills, Marked Weight Change.  Eyes Complains or has symptoms of Glasses / Contacts. Ear/Nose/Mouth/Throat Denies complaints or symptoms of Chronic sinus problems or rhinitis. Gastrointestinal Denies complaints or symptoms of Frequent diarrhea, Nausea, Vomiting. Integumentary (Skin) Complains or has symptoms of Wounds - right foot. Musculoskeletal Denies complaints or symptoms of Muscle Pain, Muscle Weakness. Neurologic Denies complaints or symptoms of Numbness/parasthesias. Psychiatric Denies complaints or symptoms of Claustrophobia, Suicidal. Objective Constitutional respirations regular, non-labored and within target range for patient.. Vitals Time Taken: 9:13 AM, Height: 70 in, Source: Stated, Weight: 170 lbs, Source: Stated, BMI: 24.4, Temperature: 98.1 F, Pulse: 76 bpm, Respiratory Rate: 20 breaths/min, Blood Pressure: 131/86 mmHg, Capillary Blood Glucose: 178 mg/dl. Cardiovascular 2+ dorsalis pedis/posterior tibialis pulses. Psychiatric pleasant and cooperative. General Notes: Right foot: to the lateral aspect there is an open wound with scant thick yellow drainage. Surrounding skin with no increased warmth or erythema. Integumentary (Hair, Skin) Wound #8 status is Open. Original cause of wound was Gradually Appeared. The date acquired was: 08/17/2021. The wound is located on the Right,Lateral Foot. The wound measures 0.9cm length x 1cm width x 0.3cm depth; 0.707cm^2 area and 0.212cm^3 volume. There is no tunneling or undermining noted. There is a medium amount of serosanguineous drainage noted. The wound margin is thickened. There is no granulation within the wound bed. There is a large (67-100%) amount of necrotic  tissue within the wound bed including Eschar and Adherent Slough. Assessment Active Problems ICD-10 Non-pressure chronic ulcer of other part of right foot with fat layer exposed Type 2 diabetes mellitus with foot ulcer Acquired absence of left leg below knee Chronic kidney disease, stage 4 (severe) Patient presents with a 1 month history of nonhealing ulcer to the right lateral foot. He is not quite sure how it started but it appears that it may have rubbed against his shoes. There was some drainage on exam and I obtained a culture. I will go ahead and prescribe doxycycline. I also recommended gentamicin ointment to the wound bed for now. We also gave him a surgical shoe to help with offloading. Follow-up in 1 week. Plan Follow-up Appointments: Return Appointment in 1 week. - Dr. Heber [facility]ce and [facility] Thursday 09/17/2021 Other: - ****Pick up topical gentamicin ointment at your local pharmacy.**** Pick up callous pads to apply over the wound for dressing changes. Edema Control - Lymphedema / SCD / Other: Elevate legs to the level of the heart or above for 30 minutes daily and/or when sitting, a frequency of: - 3-4 times a day throughout the day. Avoid standing for long periods of time. Moisturize legs daily. - every night before bed. Off-Loading: Open toe surgical shoe to: - to aid in offloading pressure to lateral foot- wear while standing or pivoting or using the wheelchair, along with callous pads. Laboratory ordered were: Anaerobic culture - someone will call you with results once it returns. The following medication(s) was prescribed: lidocaine topical 4 % gel gel topical applied in clinic for debridements only was prescribed at facility gentamicin topical 0.1 % cream 1 Apply daily to the wound starting 09/08/2021 WOUND #8: - Foot Wound Laterality: Right, Lateral Cleanser: Soap and Water 1 x Per Day/30 Days Discharge Instructions: May shower and wash wound with dial antibacterial soap and  water prior to dressing change. Topical: Gentamicin 1 x Per Day/30 Days Discharge Instructions: Apply directly to wound bed. Secondary Dressing: Optifoam Non-Adhesive Dressing, 4x4 in 1 x Per Day/30 Days Discharge Instructions: foam donut in clinic. Secondary Dressing: Callous pads 1 x Per Day/30 Days Discharge  Instructions: apply callous pads to periwound. change every day. Secured With: Child psychotherapist, Sterile 2x75 (in/in) 1 x Per Day/30 Days Discharge Instructions: Secure with stretch gauze as directed. Secured With: 51M Medipore H Soft Cloth Surgical T ape, 4 x 10 (in/yd) 1 x Per Day/30 Days Discharge Instructions: Secure with tape as directed. 1. Doxycycline 2. Gentamicin ointment 3. Offloading shoe with felt pad 4. Follow-up in 1 week Electronic Signature(s) Signed: 09/08/2021 4:29:59 PM By: [name] DO Entered By: [name] on 09/08/2021 16:27:38 -------------------------------------------------------------------------------- HxROS Details Patient Name: Date of Service: Todd Mccoy NA THA N S. 09/08/2021 9:00 A M Medical Record Number: [id number] Patient Account Number: [id number] Date of Birth/Sex: Treating RN: February 01, 1971 (51 y.o. Male) Deon Pilling Primary Care Provider: [name] Other Clinician: Referring Provider: Treating Provider/Extender: Durward Fortes Weeks in Treatment: 0 Information Obtained From Patient Constitutional Symptoms (General Health) Complaints and Symptoms: Negative for: Fatigue; Fever; Chills; Marked Weight Change Eyes Complaints and Symptoms: Positive for: Glasses / Contacts Medical History: Negative for: Cataracts; Glaucoma; Optic Neuritis Past Medical History Notes: retina detachment surgery- right eye legal blind- blurry vision Ear/Nose/Mouth/Throat Complaints and Symptoms: Negative for: Chronic sinus problems or rhinitis Medical History: Negative for: Chronic sinus  problems/congestion; Middle ear problems Gastrointestinal Complaints and Symptoms: Negative for: Frequent diarrhea; Nausea; Vomiting Medical History: Negative for: Cirrhosis ; Colitis; Crohns; Hepatitis A; Hepatitis B; Hepatitis C Integumentary (Skin) Complaints and Symptoms: Positive for: Wounds - right foot Medical History: Negative for: History of Burn Musculoskeletal Complaints and Symptoms: Negative for: Muscle Pain; Muscle Weakness Medical History: Positive for: 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 Negative for: Gout; Rheumatoid Arthritis; Osteoarthritis Neurologic Complaints and Symptoms: Negative for: Numbness/parasthesias Medical History: Positive for: Neuropathy Negative for: Dementia; Quadriplegia; Paraplegia; Seizure Disorder Psychiatric Complaints and Symptoms: Negative for: Claustrophobia; Suicidal Medical History: Negative for: Anorexia/bulimia; Confinement Anxiety Hematologic/Lymphatic Medical History: Positive for: Anemia Negative for: Hemophilia; Human Immunodeficiency Virus; Lymphedema; Sickle Cell Disease Cardiovascular Medical History: Positive for: Congestive Heart Failure - EF 40-45%; Hypertension Negative for: Angina; Arrhythmia; Coronary Artery Disease; Deep Vein Thrombosis; Hypotension; Myocardial Infarction; Peripheral Arterial Disease; Peripheral Venous Disease; Phlebitis; Vasculitis Endocrine Medical History: Positive for: Type II Diabetes Negative for: Type I Diabetes Time with diabetes: 15 yrs Treated with: Insulin Blood sugar tested every day: Yes Tested : daily Blood sugar testing results: Breakfast: 170 Genitourinary Medical History: Positive for: 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 Immunological Medical History: Negative for: Lupus Erythematosus; Raynauds; Scleroderma Oncologic Medical History: Negative for: Received Chemotherapy; Received Radiation Immunizations Pneumococcal Vaccine: Received Pneumococcal Vaccination:  No Implantable Devices None Hospitalization / Surgery History Type of Hospitalization/Surgery IandD abscess right flank scrotum exploration Left BKA 5/22 Family and Social History Cancer: No; Diabetes: Yes - Mother; Heart Disease: No; Hereditary Spherocytosis: No; Hypertension: Yes - Mother; Kidney Disease: No; Lung Disease: No; Seizures: No; Stroke: No; Thyroid Problems: No; Tuberculosis: No; Current every day smoker - 1/2 ppd; Marital Status - Single; Alcohol Use: Never; Drug Use: No History; Caffeine Use: Daily - soda, tea; Financial Concerns: No; Food, Clothing or Shelter Needs: No; Support System Lacking: No; Transportation Concerns: No Electronic Signature(s) Signed: 09/08/2021 4:29:59 PM By: [name] DO Signed: 09/08/2021 5:16:13 PM By: [name], [name] Entered By: Deon Pilling on 09/08/2021 09:20:42 -------------------------------------------------------------------------------- SuperBill Details Patient Name: Date of Service: Todd Mccoy NA THA N S. 09/08/2021 Medical Record Number: [id number] Patient Account Number: [id number] Date of Birth/Sex: Treating RN: September 19, 1970 (51 y.o. 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.4 Chronic kidney disease, stage 4 (severe) Facility Procedures CPT4 Code: [id number] Description: 99214 - WOUND CARE VISIT-LEV 4 EST PT Modifier: Quantity: 1 Physician Procedures : CPT4 Code Description Modifier [id number] - WC PHYS LEVEL 4 - EST PT ICD-10 Diagnosis Description L97.512 Non-pressure chronic ulcer of other part of right foot with fat layer exposed E11.621  Type 2 diabetes mellitus with foot ulcer Z89.512 Acquired  absence of left leg below knee N18.4 Chronic kidney disease, stage 4 (severe) Quantity: 1 Electronic Signature(s) Signed: 09/08/2021 4:29:59 PM By: [name] DO Entered By: [name] on 09/08/2021 16:29:04

## 2021-09-08 NOTE — Progress Notes (Signed)
Todd Mccoy, Todd Mccoy ([id number]) Visit Report for 09/08/2021 Allergy List Details Patient Name: Date of Service: MO [name] 09/08/2021 9:00 A M Medical Record Number: [id number] Patient Account Number: [id number] Date of Birth/Sex: Treating RN: 23-Apr-1971 (51 y.o. Male) Deon Pilling Primary Care Tannie Koskela: [name] Other Clinician: Referring Jawanda Passey: Treating Shenicka Sunderlin/Extender: Durward Fortes Weeks in Treatment: 0 Allergies Active Allergies No Known Allergies Allergy Notes Electronic Signature(s) Signed: 09/08/2021 5:16:13 PM By: [name], [name] Entered By: Deon Pilling on 09/07/2021 11:27:43 -------------------------------------------------------------------------------- Arrival Information Details Patient Name: Date of Service: MO [name] 09/08/2021 9:00 A M Medical Record Number: [id number] Patient Account Number: [id number] Date of Birth/Sex: Treating RN: 04-25-1971 (51 y.o. Male) Deon Pilling Primary Care Gaelan Glennon: [name] Other Clinician: Referring Aaira Oestreicher: Treating Meeghan Skipper/Extender: Durward Fortes Weeks in Treatment: 0 Visit Information Patient Arrived: Wheel Chair Arrival Time: 09:13 Accompanied By: nephew Transfer Assistance: Manual Patient Identification Verified: Yes Secondary Verification Process Completed: Yes Patient Requires Transmission-Based Precautions: No Patient Has Alerts: No History Since Last Visit Added or deleted any medications: No Any new allergies or adverse reactions: No Had a fall or experienced change in activities of daily living that may affect risk of falls: No Signs or symptoms of abuse/neglect since last visito No Hospitalized since last visit: No Implantable device outside of the clinic excluding cellular tissue based products placed in the center since last visit: No Electronic Signature(s) Signed: 09/08/2021 5:16:13 PM By: [name],  [name] Entered By: Deon Pilling on 09/08/2021 09:13:47 -------------------------------------------------------------------------------- Clinic Level of Care Assessment Details Patient Name: Date of Service: [place] [name] 09/08/2021 9:00 A M Medical Record Number: [id number] Patient Account Number: [id number] Date of Birth/Sex: Treating RN: [birth year] (51 y.o. Male) Deon Pilling Primary Care Jolaine Fryberger: [name] Other Clinician: Referring Abena Erdman: Treating Oluwadamilare Tobler/Extender: Durward Fortes Weeks in Treatment: 0 Clinic Level of Care Assessment Items TOOL 2 Quantity Score X- 1 0 Use when only an EandM is performed on the INITIAL visit ASSESSMENTS - Nursing Assessment / Reassessment X- 1 20 General Physical Exam (combine w/ comprehensive assessment (listed just below) when performed on new pt. evals) X- 1 25 Comprehensive Assessment (HX, ROS, Risk Assessments, Wounds Hx, etc.) ASSESSMENTS - Wound and Skin A ssessment / Reassessment X - Simple Wound Assessment / Reassessment - one wound 1 5 []  - 0 Complex Wound Assessment / Reassessment - multiple wounds X- 1 10 Dermatologic / Skin Assessment (not related to wound area) ASSESSMENTS - Ostomy and/or Continence Assessment and Care []  - 0 Incontinence Assessment and Management []  - 0 Ostomy Care Assessment and Management (repouching, etc.) PROCESS - Coordination of Care X - Simple Patient / Family Education for ongoing care 1 15 []  - 0 Complex (extensive) Patient / Family Education for ongoing care X- 1 10 Staff obtains Programmer, systems, Records, T Results / Process Orders est []  - 0 Staff telephones HHA, Nursing Homes / Clarify orders / etc []  - 0 Routine Transfer to another Facility (non-emergent condition) []  - 0 Routine Hospital Admission (non-emergent condition) []  - 0 New Admissions / Biomedical engineer / Ordering NPWT Apligraf, etc. , []  - 0 Emergency Hospital Admission (emergent  condition) X- 1 10 Simple Discharge Coordination []  - 0 Complex (extensive) Discharge Coordination PROCESS - Special Needs []  - 0 Pediatric / Minor Patient Management []  - 0 Isolation Patient Management []  - 0 Hearing / Language / Visual special needs []  -  0 Assessment of Community assistance (transportation, D/C planning, etc.) []  - 0 Additional assistance / Altered mentation []  - 0 Support Surface(s) Assessment (bed, cushion, seat, etc.) INTERVENTIONS - Wound Cleansing / Measurement X- 1 5 Wound Imaging (photographs - any number of wounds) []  - 0 Wound Tracing (instead of photographs) X- 1 5 Simple Wound Measurement - one wound []  - 0 Complex Wound Measurement - multiple wounds X- 1 5 Simple Wound Cleansing - one wound []  - 0 Complex Wound Cleansing - multiple wounds INTERVENTIONS - Wound Dressings X - Small Wound Dressing one or multiple wounds 1 10 []  - 0 Medium Wound Dressing one or multiple wounds []  - 0 Large Wound Dressing one or multiple wounds []  - 0 Application of Medications - injection INTERVENTIONS - Miscellaneous []  - 0 External ear exam []  - 0 Specimen Collection (cultures, biopsies, blood, body fluids, etc.) []  - 0 Specimen(s) / Culture(s) sent or taken to Lab for analysis []  - 0 Patient Transfer (multiple staff / Civil Service fast streamer / Similar devices) []  - 0 Simple Staple / Suture removal (25 or less) []  - 0 Complex Staple / Suture removal (26 or more) []  - 0 Hypo / Hyperglycemic Management (close monitor of Blood Glucose) X- 1 15 Ankle / Brachial Index (ABI) - do not check if billed separately Has the patient been seen at the hospital within the last three years: Yes Total Score: 135 Level Of Care: New/Established - Level 4 Electronic Signature(s) Signed: 09/08/2021 5:16:13 PM By: [name], [name] Entered By: Deon Pilling on 09/08/2021 10:01:55 -------------------------------------------------------------------------------- Encounter  Discharge Information Details Patient Name: Date of Service: MO [name] 09/08/2021 9:00 A M Medical Record Number: [id number] Patient Account Number: [id number] Date of Birth/Sex: Treating RN: 21-Mar-1971 (51 y.o. Male) Deon Pilling Primary Care Laya Letendre: [name] Other Clinician: Referring Scarlettrose Costilow: Treating Grafton Warzecha/Extender: Durward Fortes Weeks in Treatment: 0 Encounter Discharge Information Items Discharge Condition: Stable Ambulatory Status: Wheelchair Discharge Destination: Home Transportation: Private Auto Accompanied By: nephew Schedule Follow-up Appointment: Yes Clinical Summary of Care: Electronic Signature(s) Signed: 09/08/2021 5:16:13 PM By: [name], [name] Entered By: Deon Pilling on 09/08/2021 10:02:36 -------------------------------------------------------------------------------- Lower Extremity Assessment Details Patient Name: Date of Service: MO [name] 09/08/2021 9:00 A M Medical Record Number: [id number] Patient Account Number: [id number] Date of Birth/Sex: Treating RN: Dec 28, 1970 (51 y.o. Male) Deon Pilling Primary Care Esma Kilts: [name] Other Clinician: Referring Latona Krichbaum: Treating Advik Weatherspoon/Extender: Durward Fortes Weeks in Treatment: 0 Edema Assessment Assessed: [Left: No] [Right: Yes] Edema: [Left: Ye] [Right: s] Calf Left: Right: Point of Measurement: 33 cm From Medial Instep 35 cm Ankle Left: Right: Point of Measurement: 10 cm From Medial Instep 25 cm Knee To Floor Left: Right: From Medial Instep 42 cm Vascular Assessment Pulses: Dorsalis Pedis Palpable: [Right:Yes] Doppler Audible: [Right:Yes] Posterior Tibial Palpable: [Right:Yes] Doppler Audible: [Right:Yes] Blood Pressure: Brachial: [Right:131] Ankle: [Right:Dorsalis Pedis: 130 0.99] Electronic Signature(s) Signed: 09/08/2021 5:16:13 PM By: [name], [name] Entered By: Deon Pilling on  09/08/2021 09:27:40 -------------------------------------------------------------------------------- Multi Wound Chart Details Patient Name: Date of Service: MO [name] 09/08/2021 9:00 A M Medical Record Number: [id number] Patient Account Number: [id number] Date of Birth/Sex: Treating RN: [birth year] (51 y.o. Male) Primary Care Darly Fails: [name] Other Clinician: Referring Arie Gable: Treating Izaiyah Kleinman/Extender: Caroline More, [name] Weeks in Treatment: 0 Vital Signs Height(in): 70 Capillary Blood Glucose(mg/dl): 178 Weight(lbs): 170 Pulse(bpm): 76 Body Mass Index(BMI):  24.4 Blood Pressure(mmHg): 131/86 Temperature(F): 98.1 Respiratory Rate(breaths/min): 20 Photos: [N/A:N/A] Right, Lateral Foot N/A N/A Wound Location: Gradually Appeared N/A N/A Wounding Event: Diabetic Wound/Ulcer of the Lower N/A N/A Primary Etiology: Extremity Anemia, Congestive Heart Failure, N/A N/A Comorbid History: Hypertension, Type II Diabetes, End Stage Renal Disease, Osteomyelitis, Neuropathy 08/17/2021 N/A N/A Date Acquired: 0 N/A N/A Weeks of Treatment: Open N/A N/A Wound Status: No N/A N/A Wound Recurrence: 0.9x1x0.3 N/A N/A Measurements L x W x D (cm) 0.707 N/A N/A A (cm) : rea 0.212 N/A N/A Volume (cm) : 0.00% N/A N/A % Reduction in A rea: 0.00% N/A N/A % Reduction in Volume: Unable to visualize wound bed N/A N/A Classification: Medium N/A N/A Exudate A mount: Serosanguineous N/A N/A Exudate Type: red, brown N/A N/A Exudate Color: Thickened N/A N/A Wound Margin: None Present (0%) N/A N/A Granulation A mount: Large (67-100%) N/A N/A Necrotic A mount: Eschar, Adherent Slough N/A N/A Necrotic Tissue: Fascia: No N/A N/A Exposed Structures: Fat Layer (Subcutaneous Tissue): No Tendon: No Muscle: No Joint: No Bone: No None N/A N/A Epithelialization: Treatment Notes Wound #8 (Foot) Wound Laterality: Right, Lateral Cleanser Soap  and Water Discharge Instruction: May shower and wash wound with dial antibacterial soap and water prior to dressing change. Peri-Wound Care Topical Gentamicin Discharge Instruction: Apply directly to wound bed. Primary Dressing Secondary Dressing Optifoam Non-Adhesive Dressing, 4x4 in Discharge Instruction: foam donut in clinic. Callous pads Discharge Instruction: apply callous pads to periwound. change every day. Secured With Conforming Stretch Gauze Bandage, Sterile 2x75 (in/in) Discharge Instruction: Secure with stretch gauze as directed. 51M Medipore H Soft Cloth Surgical T ape, 4 x 10 (in/yd) Discharge Instruction: Secure with tape as directed. Compression Wrap Compression Stockings Add-Ons Electronic Signature(s) Signed: 09/08/2021 4:29:59 PM By: [name] DO Entered By: [name] on 09/08/2021 16:13:21 -------------------------------------------------------------------------------- Multi-Disciplinary Care Plan Details Patient Name: Date of Service: [place] [name] 09/08/2021 9:00 A M Medical Record Number: [id number] Patient Account Number: [id number] Date of Birth/Sex: Treating RN: Mar 14, 1971 (51 y.o. Male) Deon Pilling Primary Care Vineeth Fell: [name] Other Clinician: Referring Theola Cuellar: Treating Kellianne Ek/Extender: Durward Fortes Weeks in Treatment: 0 Active Inactive Nutrition Nursing Diagnoses: Potential for alteratiion in Nutrition/Potential for imbalanced nutrition Goals: Patient/caregiver agrees to and verbalizes understanding of need to obtain nutritional consultation Date Initiated: 09/08/2021 Target Resolution Date: 10/09/2021 Goal Status: Active Patient/caregiver will maintain therapeutic glucose control Date Initiated: 09/08/2021 Target Resolution Date: 10/08/2021 Goal Status: Active Interventions: Assess HgA1c results as ordered upon admission and as needed Provide education on elevated blood sugars and impact  on wound healing Provide education on nutrition Treatment Activities: Obtain HgA1c : 09/08/2021 Notes: Orientation to the Wound Care Program Nursing Diagnoses: Knowledge deficit related to the wound healing center program Goals: Patient/caregiver will verbalize understanding of the [place] Date Initiated: 09/08/2021 Target Resolution Date: 10/09/2021 Goal Status: Active Interventions: Provide education on orientation to the wound center Notes: Wound/Skin Impairment Nursing Diagnoses: Knowledge deficit related to ulceration/compromised skin integrity Goals: Patient/caregiver will verbalize understanding of skin care regimen Date Initiated: 09/08/2021 Target Resolution Date: 10/09/2021 Goal Status: Active Interventions: Assess patient/caregiver ability to perform ulcer/skin care regimen upon admission and as needed Assess ulceration(s) every visit Provide education on ulcer and skin care Treatment Activities: Skin care regimen initiated : 09/08/2021 Topical wound management initiated : 09/08/2021 Notes: Electronic Signature(s) Signed: 09/08/2021 5:16:13 PM By: [name], [name] Entered By: Deon Pilling on 09/08/2021 09:47:15 -------------------------------------------------------------------------------- Pain Assessment Details Patient  Name: Date of Service: [place] [name] 09/08/2021 9:00 A M Medical Record Number: [id number] Patient Account Number: [id number] Date of Birth/Sex: Treating RN: 12-Jun-1971 (51 y.o. Male) Deon Pilling Primary Care Gladyce Mcray: [name] Other Clinician: Referring Kiffany Schelling: Treating Lugenia Assefa/Extender: Durward Fortes Weeks in Treatment: 0 Active Problems Location of Pain Severity and Description of Pain Patient Has Paino No Site Locations Rate the pain. Current Pain Level: 0 Pain Management and Medication Current Pain Management: Medication: No Cold Application: No Rest: No Massage:  No Activity: No T.E.N.S.: No Heat Application: No Leg drop or elevation: No Is the Current Pain Management Adequate: Adequate How does your wound impact your activities of daily livingo Sleep: No Bathing: No Appetite: No Relationship With Others: No Bladder Continence: No Emotions: No Bowel Continence: No Work: No Toileting: No Drive: No Dressing: No Hobbies: No Engineer, maintenance) Signed: 09/08/2021 5:16:13 PM By: [name], [name] Entered By: Deon Pilling on 09/08/2021 09:17:22 -------------------------------------------------------------------------------- Patient/Caregiver Education Details Patient Name: Date of Service: MO [name] NA THA Robb Matar 3/7/2023andnbsp9:00 A M Medical Record Number: [id number] Patient Account Number: [id number] Date of Birth/Gender: Treating RN: Apr 23, 1971 (51 y.o. Male) Deon Pilling Primary Care Physician: [name] Other Clinician: Referring Physician: Treating Physician/Extender: Durward Fortes Weeks in Treatment: 0 Education Assessment Education Provided To: Patient Education Topics Provided Elevated Blood Sugar/ Impact on Healing: Handouts: Elevated Blood Sugars: How Do They Affect Wound Healing Methods: Explain/Verbal, Printed Responses: Reinforcements needed [place]: o Handouts: Welcome T [facility] [place] o Methods: Explain/Verbal, Printed Responses: Reinforcements needed Wound/Skin Impairment: Handouts: Caring for Your Ulcer, Skin Care Do's and Dont's Methods: Explain/Verbal, Printed Responses: Reinforcements needed Electronic Signature(s) Signed: 09/08/2021 5:16:13 PM By: [name], [name] Entered By: Deon Pilling on 09/08/2021 09:48:29 -------------------------------------------------------------------------------- Wound Assessment Details Patient Name: Date of Service: MO [name] 09/08/2021 9:00 A M Medical Record Number:  [id number] Patient Account Number: [id number] Date of Birth/Sex: Treating RN: [birth year] (51 y.o. Male) Deon Pilling Primary Care Regis Hinton: [name] Other Clinician: Referring Shira Bobst: Treating Ares Tegtmeyer/Extender: Durward Fortes Weeks in Treatment: 0 Wound Status Wound Number: 8 Primary Diabetic Wound/Ulcer of the Lower Extremity Etiology: Wound Location: Right, Lateral Foot Wound Open Wounding Event: Gradually Appeared Status: Date Acquired: 08/17/2021 Comorbid Anemia, Congestive Heart Failure, Hypertension, Type II Diabetes, Weeks Of Treatment: 0 History: End Stage Renal Disease, Osteomyelitis, Neuropathy Clustered Wound: No Photos Wound Measurements Length: (cm) 0.9 Width: (cm) 1 Depth: (cm) 0.3 Area: (cm) 0.707 Volume: (cm) 0.212 % Reduction in Area: 0% % Reduction in Volume: 0% Epithelialization: None Tunneling: No Undermining: No Wound Description Classification: Unable to visualize wound bed Wound Margin: Thickened Exudate Amount: Medium Exudate Type: Serosanguineous Exudate Color: red, brown Foul Odor After Cleansing: No Slough/Fibrino Yes Wound Bed Granulation Amount: None Present (0%) Exposed Structure Necrotic Amount: Large (67-100%) Fascia Exposed: No Necrotic Quality: Eschar, Adherent Slough Fat Layer (Subcutaneous Tissue) Exposed: No Tendon Exposed: No Muscle Exposed: No Joint Exposed: No Bone Exposed: No Treatment Notes Wound #8 (Foot) Wound Laterality: Right, Lateral Cleanser Soap and Water Discharge Instruction: May shower and wash wound with dial antibacterial soap and water prior to dressing change. Peri-Wound Care Topical Gentamicin Discharge Instruction: Apply directly to wound bed. Primary Dressing Secondary Dressing Optifoam Non-Adhesive Dressing, 4x4 in Discharge Instruction: foam donut in clinic. Callous pads Discharge Instruction: apply callous pads to periwound. change every day. Secured  With Psychologist, occupational  Gauze Bandage, Sterile 2x75 (in/in) Discharge Instruction: Secure with stretch gauze as directed. 85M Medipore H Soft Cloth Surgical T ape, 4 x 10 (in/yd) Discharge Instruction: Secure with tape as directed. Compression Wrap Compression Stockings Add-Ons Electronic Signature(s) Signed: 09/08/2021 5:16:13 PM By: [name], [name] Entered By: Deon Pilling on 09/08/2021 09:34:35 -------------------------------------------------------------------------------- Vitals Details Patient Name: Date of Service: MO [name] 09/08/2021 9:00 A M Medical Record Number: [id number] Patient Account Number: [id number] Date of Birth/Sex: Treating RN: 1970-09-03 (51 y.o. Male) Deon Pilling Primary Care Pheonix Wisby: [name] Other Clinician: Referring Darshawn Boateng: Treating Huzaifa Viney/Extender: Durward Fortes Weeks in Treatment: 0 Vital Signs Time Taken: 09:13 Temperature (F): 98.1 Height (in): 70 Pulse (bpm): 76 Source: Stated Respiratory Rate (breaths/min): 20 Weight (lbs): 170 Blood Pressure (mmHg): 131/86 Source: Stated Capillary Blood Glucose (mg/dl): 178 Body Mass Index (BMI): 24.4 Reference Range: 80 - 120 mg / dl Electronic Signature(s) Signed: 09/08/2021 5:16:13 PM By: [name], [name] Entered By: Deon Pilling on 09/08/2021 09:14:29

## 2021-09-08 NOTE — Progress Notes (Signed)
Todd Mccoy, Todd Mccoy ([id number]) ?Visit Report for 09/08/2021 ?Abuse Risk Screen Details ?Patient Name: Date of Service: ?[name] ?Medical Record Number: [id number] ?Patient Account Number: [id number] ?Date of Birth/Sex: Treating RN: ?1970/11/23 (51 y.o. Male) Todd Mccoy, Todd Mccoy ?Primary Care Todd Mccoy: Todd Mccoy Other Clinician: ?Referring Todd Mccoy: ?Treating Todd Mccoy/Extender: Todd Mccoy ?Todd Mccoy ?Weeks in Treatment: 0 ?Abuse Risk Screen Items ?Answer ?ABUSE RISK SCREEN: ?Has anyone close to you tried to hurt or harm you recentlyo No ?Do you feel uncomfortable with anyone in your familyo No ?Has anyone forced you do things that you didnt want to doo No ?Electronic Signature(s) ?Signed: 09/08/2021 5:16:13 PM By: [name], [name] ?Entered By: Deon Pilling on 09/08/2021 09:15:26 ?-------------------------------------------------------------------------------- ?Activities of Daily Living Details ?Patient Name: Date of Service: ?[name] ?Medical Record Number: [id number] ?Patient Account Number: [id number] ?Date of Birth/Sex: Treating RN: ?May 11, 1971 (51 y.o. Male) Todd Mccoy, Todd Mccoy ?Primary Care Todd Mccoy: Todd Mccoy Other Clinician: ?Referring Todd Mccoy: ?Treating Todd Mccoy/Extender: Todd Mccoy ?Todd Mccoy ?Weeks in Treatment: 0 ?Activities of Daily Living Items ?Answer ?Activities of Daily Living (Please select one for each item) ?Drive Automobile Not Able ?T Medications ?ake Completely Able ?Use T elephone Completely Able ?Care for Appearance Completely Able ?Use T oilet Completely Able ?Bath / Shower Completely Able ?Dress Self Completely Able ?Feed Self Completely Able ?Walk Not Able ?Get In / Out Bed Need Assistance ?Housework Not Able ?Prepare Meals Not Able ?Handle Money Need Assistance ?Shop for Self Need Assistance ?Electronic Signature(s) ?Signed: 09/08/2021 5:16:13 PM By: [name], [name] ?Entered By: Deon Pilling on 09/08/2021 09:15:47 ?-------------------------------------------------------------------------------- ?Education Screening Details ?Patient Name: ?Date of Service: ?[name] ?Medical Record Number: [id number] ?Patient Account Number: [id number] ?Date of Birth/Sex: ?Treating RN: ?21-May-1971 (51 y.o. Male) Todd Mccoy, Todd Mccoy ?Primary Care Todd Mccoy: Todd Mccoy ?Other Clinician: ?Referring Todd Mccoy: ?Treating Todd Mccoy/Extender: Todd Mccoy ?Todd Mccoy ?Weeks in Treatment: 0 ?Primary Learner Assessed: Patient ?Learning Preferences/Education Level/Primary Language ?Learning Preference: Explanation, Demonstration, Printed Material ?Highest Education Level: High School ?Preferred Language: English ?Cognitive Barrier ?Language Barrier: No ?Translator Needed: No ?Memory Deficit: No ?Emotional Barrier: No ?Cultural/Religious Beliefs Affecting Medical Care: No ?Physical Barrier ?Impaired Vision: Yes Glasses, Legally Blind, retinapathy ?Impaired Hearing: No ?Decreased Hand dexterity: No ?Knowledge/Comprehension ?Knowledge Level: High ?Comprehension Level: High ?Ability to understand written instructions: High ?Ability to understand verbal instructions: High ?Motivation ?Anxiety Level: Calm ?Cooperation: Cooperative ?Education Importance: Acknowledges Need ?Interest in Health Problems: Asks Questions ?Perception: Coherent ?Willingness to Engage in Self-Management High ?Activities: ?Readiness to Engage in Self-Management High ?Activities: ?Electronic Signature(s) ?Signed: 09/08/2021 5:16:13 PM By: [name], [name] ?Entered By: Deon Pilling on 09/08/2021 09:16:38 ?-------------------------------------------------------------------------------- ?Fall Risk Assessment Details ?Patient Name: ?Date of Service: ?[name] ?Medical Record Number: [id number] ?Patient Account Number: [id number] ?Date of Birth/Sex: ?Treating RN: ?[birth year] (51 y.o. Male)  Todd Mccoy, Todd Mccoy ?Primary Care Todd Mccoy: Todd Mccoy ?Other Clinician: ?Referring Todd Mccoy: ?Treating Todd Mccoy/Extender: Todd Mccoy ?Todd Mccoy ?Weeks in Treatment: 0 ?Fall Risk Assessment Items ?Have you had 2 or more falls in the last 12 monthso 0 No ?Have you had any fall that resulted in injury in the last 12 monthso 0 No ?FALLS RISK SCREEN ?History of falling - immediate or within 3 months 0 No ?Secondary diagnosis (Do you have 2 or more medical diagnoseso) 0 No ?Ambulatory aid ?None/bed rest/wheelchair/nurse 0  Yes ?Crutches/cane/walker 0 No ?Furniture 30 Yes ?Intravenous therapy Access/Saline/Heparin Lock 0 No ?Gait/Transferring ?Normal/ bed rest/ wheelchair 0 Yes ?Weak (short steps with or without shuffle, stooped but able to lift head while walking, may seek 10 Yes ?support from furniture) ?Impaired (short steps with shuffle, may have difficulty arising from chair, head down, impaired 0 No ?balance) ?Mental Status ?Oriented to own ability 0 Yes ?Electronic Signature(s) ?Signed: 09/08/2021 5:16:13 PM By: [name], [name] ?Entered By: Deon Pilling on 09/08/2021 09:17:13 ?-------------------------------------------------------------------------------- ?Foot Assessment Details ?Patient Name: ?Date of Service: ?[name] ?Medical Record Number: [id number] ?Patient Account Number: [id number] ?Date of Birth/Sex: ?Treating RN: ?01/12/1971 (51 y.o. Male) Todd Mccoy, Todd Mccoy ?Primary Care [name]: Todd Mccoy ?Other Clinician: ?Referring [name]: ?Treating [name]/Extender: Todd Mccoy ?Todd Mccoy ?Weeks in Treatment: 0 ?Foot Assessment Items ?Site Locations ?+ = Sensation present, - = Sensation absent, C = Callus, U = Ulcer ?R = Redness, W = Warmth, Mccoy = Maceration, PU = Pre-ulcerative lesion ?F = Fissure, S = Swelling, D = Dryness ?Assessment ?Right: Left: ?Other Deformity: No No ?Prior Foot Ulcer: No No ?Prior Amputation: Yes No ?Charcot Joint: No  No ?Ambulatory Status: Ambulatory With Help ?Assistance Device: Wheelchair ?Gait: Unsteady ?Notes ?right foot second toe amputated. ?Electronic Signature(s) ?Signed: 09/08/2021 5:16:13 PM By: [name], [name] ?Entered By: Deon Pilling on 09/08/2021 09:29:03 ?-------------------------------------------------------------------------------- ?Nutrition Risk Screening Details ?Patient Name: ?Date of Service: ?[name] ?Medical Record Number: [id number] ?Patient Account Number: [id number] ?Date of Birth/Sex: ?Treating RN: ?[birth year] (51 y.o. Male) Todd Mccoy, Todd Mccoy ?Primary Care Sofi Bryars: Todd Mccoy ?Other Clinician: ?Referring Tangi Shroff: ?Treating Desman Polak/Extender: Todd Mccoy ?Todd Mccoy ?Weeks in Treatment: 0 ?Height (in): 70 ?Weight (lbs): 170 ?Body Mass Index (BMI): 24.4 ?Nutrition Risk Screening Items ?Score Screening ?NUTRITION RISK SCREEN: ?I have an illness or condition that made me change the kind and/or amount of food I eat 2 Yes ?I eat fewer than two meals per day 0 No ?I eat few fruits and vegetables, or milk products 0 No ?I have three or more drinks of beer, liquor or wine almost every day 0 No ?I have tooth or mouth problems that make it hard for me to eat 0 No ?I don't always have enough money to buy the food I need 0 No ?I eat alone most of the time 0 No ?I take three or more different prescribed or over-the-counter drugs a day 1 Yes ?Without wanting to, I have lost or gained 10 pounds in the last six months 0 No ?I am not always physically able to shop, cook and/or feed myself 0 No ?Nutrition Protocols ?Good Risk Protocol ?Provide education on elevated blood ?Moderate Risk Protocol 0 sugars and impact on wound healing, ?as applicable ?High Risk Proctocol ?Risk Level: Moderate Risk ?Score: 3 ?Electronic Signature(s) ?Signed: 09/08/2021 5:16:13 PM By: [name], [name] ?Entered By: Deon Pilling on 09/08/2021 09:17:31 ?

## 2021-09-13 LAB — AEROBIC/ANAEROBIC CULTURE W GRAM STAIN (SURGICAL/DEEP WOUND)

## 2021-09-17 DIAGNOSIS — E113521 Type 2 diabetes mellitus with proliferative diabetic retinopathy with traction retinal detachment involving the macula, right eye: Secondary | ICD-10-CM | POA: Diagnosis not present

## 2021-09-17 DIAGNOSIS — H3582 Retinal ischemia: Secondary | ICD-10-CM | POA: Diagnosis not present

## 2021-09-17 DIAGNOSIS — T85398D Other mechanical complication of other ocular prosthetic devices, implants and grafts, subsequent encounter: Secondary | ICD-10-CM | POA: Diagnosis not present

## 2021-09-17 DIAGNOSIS — H35341 Macular cyst, hole, or pseudohole, right eye: Secondary | ICD-10-CM | POA: Diagnosis not present

## 2021-09-18 ENCOUNTER — Encounter (HOSPITAL_BASED_OUTPATIENT_CLINIC_OR_DEPARTMENT_OTHER): Payer: Medicaid Other | Admitting: Internal Medicine

## 2021-09-24 ENCOUNTER — Other Ambulatory Visit: Payer: Self-pay

## 2021-09-24 ENCOUNTER — Encounter (HOSPITAL_BASED_OUTPATIENT_CLINIC_OR_DEPARTMENT_OTHER): Payer: Medicaid Other | Admitting: Internal Medicine

## 2021-09-24 DIAGNOSIS — L97512 Non-pressure chronic ulcer of other part of right foot with fat layer exposed: Secondary | ICD-10-CM | POA: Diagnosis not present

## 2021-09-24 DIAGNOSIS — Z89512 Acquired absence of left leg below knee: Secondary | ICD-10-CM

## 2021-09-24 DIAGNOSIS — I5042 Chronic combined systolic (congestive) and diastolic (congestive) heart failure: Secondary | ICD-10-CM | POA: Diagnosis not present

## 2021-09-24 DIAGNOSIS — E11621 Type 2 diabetes mellitus with foot ulcer: Secondary | ICD-10-CM

## 2021-09-24 DIAGNOSIS — N184 Chronic kidney disease, stage 4 (severe): Secondary | ICD-10-CM

## 2021-09-24 DIAGNOSIS — I132 Hypertensive heart and chronic kidney disease with heart failure and with stage 5 chronic kidney disease, or end stage renal disease: Secondary | ICD-10-CM | POA: Diagnosis not present

## 2021-09-24 DIAGNOSIS — Z794 Long term (current) use of insulin: Secondary | ICD-10-CM | POA: Diagnosis not present

## 2021-09-24 NOTE — Progress Notes (Signed)
JAJA, SWITALSKI ([id number]) ?Visit Report for 09/24/2021 ?Chief Complaint Document Details ?Patient Name: Date of Service: ?[name] 09/24/2021 11:00 A M ?Medical Record Number: [id number] ?Patient Account Number: [id number] ?Date of Birth/Sex: Treating RN: ?[birth year] (52 y.o. M) ?Primary Care Provider: [name] Other Clinician: ?Referring Provider: ?Treating Provider/Extender: [name] ?[name], [name] ?Weeks in Treatment: 2 ?Information Obtained from: Patient ?Chief Complaint ?11/20/2019; patient is here with for review of multiple wounds over the right lower leg, right great and right second toe ?09/08/2021; patient presents for wound to the right lateral foot ?Electronic Signature(s) ?Signed: 09/24/2021 12:25:31 PM By: [name] DO ?Entered By: [name] on 09/24/2021 12:16:07 ?-------------------------------------------------------------------------------- ?Debridement Details ?Patient Name: Date of Service: ?[name] 09/24/2021 11:00 A M ?Medical Record Number: [id number] ?Patient Account Number: [id number] ?Date of Birth/Sex: Treating RN: ?1970/11/27 (51 y.o. [name] ?Primary Care Provider: [name] Other Clinician: ?Referring Provider: ?Treating Provider/Extender: [name] ?[name], [name] ?Weeks in Treatment: 2 ?Debridement Performed for Assessment: Wound #8 Right,Lateral Foot ?Performed By: Physician [name], DO ?Debridement Type: Debridement ?Severity of Tissue Pre Debridement: Fat layer exposed ?Level of Consciousness (Pre-procedure): Awake and Alert ?Pre-procedure Verification/Time Out Yes - 11:31 ?Taken: ?Start Time: 11:32 ?Pain Control: ?Other : Benzocaine ?T Area Debrided (L x W): ?otal 0.7 (cm) x 1 (cm) = 0.7 (cm?) ?Tissue and other material debrided: Non-Viable, Slough, Subcutaneous, [place] ?Level: Skin/Subcutaneous Tissue ?Debridement Description: Excisional ?Instrument: Blade, Curette, Forceps ?Bleeding:  Minimum ?Hemostasis Achieved: Pressure ?End Time: 11:40 ?Response to Treatment: Procedure was tolerated well ?Level of Consciousness (Post- Awake and Alert ?procedure): ?Post Debridement Measurements of Total Wound ?Length: (cm) 0.7 ?Width: (cm) 1 ?Depth: (cm) 0.3 ?Volume: (cm?) 0.165 ?Character of Wound/Ulcer Post Debridement: Stable ?Severity of Tissue Post Debridement: Fat layer exposed ?Post Procedure Diagnosis ?Same as Pre-procedure ?Electronic Signature(s) ?Signed: 09/24/2021 12:25:31 PM By: [name] DO ?Signed: 09/24/2021 4:43:06 PM By: Lorrin Jackson ?Entered By: Lorrin Jackson on 09/24/2021 11:41:18 ?-------------------------------------------------------------------------------- ?HPI Details ?Patient Name: Date of Service: ?[name] 09/24/2021 11:00 A M ?Medical Record Number: [id number] ?Patient Account Number: [id number] ?Date of Birth/Sex: Treating RN: ?08-13-1970 (51 y.o. M) ?Primary Care Provider: [name] Other Clinician: ?Referring Provider: ?Treating Provider/Extender: [name] ?[name], [name] ?Weeks in Treatment: 2 ?History of Present Illness ?HPI Description: ADMISSION ?11/20/2019; this is a 51 year old man with poorly controlled type 2 diabetes. Roughly 3 to 4 weeks ago he noted swelling of his leg open wounds with draining ?sores. This happened fairly suddenly. He was seen in the ER on 11/07/2019 noted to have wounds of the right lower extremity x2 weeks at that point. An x-ray of ?the area was negative one of the wounds was cultured showing MSSA and group B strep. He was noted to have multiple wounds. He was given a course of ?doxycycline for 7 days white count was 8.4 hemoglobin 9.7. Since then he has been washing the leg and applying dry gauze. He is here in the clinic for review ?of this. 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 At that point his culture was MRSA. No no ?11/27/19-Patient returns after starting in the clinic for open leg wounds on his right and his right second toe. We have been applying silver alginate and 3 layer ?compression on the  right. Patient completed course of doxycycline. The wounds are looking slightly smaller, the right posterior wound appears to have healed ?6/8; patient is here in follow-up for open wounds on his right leg and his right second toe. X-ray I did last time was strangely negative. I gave him a course of ?Keflex which she is completed the area on the tip of the second toe is actually close over. All of the wounds on his right anterior lateral and posterior leg are ?considerably better. There is still open wounds on the right lateral and right posterior but they are measuring better. These were felt to be abscesses for which ?she was hospitalized in early May. Cultures originally showed MSSA and group B strep ?6/25; patient is here for review of wounds on the right lower leg which were felt to be abscesses. He also had an area on his left second toe when he came in ?this is closed over. Almost all the areas on the right leg are closed 2 small open areas remain. We use silver alginate to these wounds and let them wear his ?own stockings. Should be closed the next time he is here ?Readmission 09/08/2021 ?Mr. 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 Chronic kidney disease stage IV and combined chronic systolic and diastolic heart failure that presents to the clinic for a 1 month history of ?open wound to the right lateral foot. He states he visited the ED on 08/18/2021 for this issue and was prescribed doxycycline. He states that the wound became ?slightly smaller after taking this. He has been using AandE ointment to the wound bed. He denies signs of infection. ?3/23; patient  presents for follow-up. He had a wound culture at last clinic visit that showed moderate Staph aureus and was started on Augmentin. He has been ?using gentamicin ointment to the wound bed. He currently denies systemic signs of infection. ?Electronic Signature(s) ?Signed: 09/24/2021 12:25:31 PM By: [name] DO ?Entered By: [name] on 09/24/2021 12:17:04 ?-------------------------------------------------------------------------------- ?Physical Exam Details ?Patient Name: Date of Service: ?[name] 09/24/2021 11:00 A M ?Medical Record Number: [id number] ?Patient Account Number: [id number] ?Date of Birth/Sex: Treating RN: ?[birth year] (51 y.o. M) ?Primary Care Provider: [name] Other Clinician: ?Referring Provider: ?Treating Provider/Extender: [name] ?[name], [name] ?Weeks in Treatment: 2 ?Constitutional ?respirations regular, non-labored and within target range for patient.Marland Kitchen ?Cardiovascular ?2+ dorsalis pedis/posterior tibialis pulses. ?Psychiatric ?pleasant and cooperative. ?Notes ?Right foot: T the lateral aspect there is an open wound with nonviable tissue throughout. No signs of surrounding infection. ?o ?Electronic Signature(s) ?Signed: 09/24/2021 12:25:31 PM By: [name] DO ?Entered By: [name] on 09/24/2021 12:17:43 ?-------------------------------------------------------------------------------- ?Physician Orders Details ?Patient Name: Date of Service: ?[name] 09/24/2021 11:00 A M ?Medical Record Number: [id number] ?Patient Account Number: [id number] ?Date of Birth/Sex: Treating RN: ?08/19/1970 (51 y.o. [name] ?Primary Care Provider: [name] Other Clinician: ?Referring Provider: ?Treating Provider/Extender: [name] ?[name], [name] ?Weeks in Treatment: 2 ?Verbal / Phone Orders: No ?Diagnosis Coding ?ICD-10 Coding ?Code Description ?L97.512 Non-pressure chronic ulcer of other part of right foot with fat  layer exposed ?E11.621 Type 2 diabetes mellitus with foot ulcer ?Y30.160 Acquired absence of left leg below knee ?N18.4 Chronic kidney disease, stage 4 (severe) ?Follow-up Appointments ?ppointment in 1

## 2021-09-24 NOTE — Progress Notes (Signed)
ARCHIE, SHEA ([id number]) ?Visit Report for 09/24/2021 ?Arrival Information Details ?Patient Name: Date of Service: ?MO [name] 09/24/2021 11:00 A M ?Medical Record Number: [id number] ?Patient Account Number: [id number] ?Date of Birth/Sex: Treating RN: ?08-Dec-1970 (51 y.o. [name] ?Primary Care Obrien Huskins: [name] Other Clinician: ?Referring Dmarcus Decicco: ?Treating Braxten Memmer/Extender: [name] ?[name], [name] ?Weeks in Treatment: 2 ?Visit Information History Since Last Visit ?Added or deleted any medications: No ?Patient Arrived: Wheel Chair ?Any new allergies or adverse reactions: No ?Arrival Time: 11:08 ?Had a fall or experienced change in No ?Accompanied By: Nephew ?activities of daily living that may affect ?Transfer Assistance: None ?risk of falls: ?Patient Identification Verified: Yes ?Signs or symptoms of abuse/neglect since last visito No ?Secondary Verification Process Completed: Yes ?Hospitalized since last visit: No ?Patient Requires Transmission-Based Precautions: No ?Implantable device outside of the clinic excluding No ?Patient Has Alerts: No ?cellular tissue based products placed in the center ?since last visit: ?Has Dressing in Place as Prescribed: Yes ?Pain Present Now: No ?Electronic Signature(s) ?Signed: 09/24/2021 4:43:06 PM By: Lorrin Jackson ?Entered By: Lorrin Jackson on 09/24/2021 11:10:55 ?-------------------------------------------------------------------------------- ?Encounter Discharge Information Details ?Patient Name: Date of Service: ?MO [name] 09/24/2021 11:00 A M ?Medical Record Number: [id number] ?Patient Account Number: [id number] ?Date of Birth/Sex: Treating RN: ?1970-11-27 (51 y.o. [name] ?Primary Care Amantha Sklar: [name] Other Clinician: ?Referring Gar Glance: ?Treating Sumayah Bearse/Extender: [name] ?[name], [name] ?Weeks in Treatment: 2 ?Encounter Discharge Information Items Post Procedure  Vitals ?Discharge Condition: Stable ?Temperature (F): 98.7 ?Ambulatory Status: Gilford Rile ?Pulse (bpm): 83 ?Discharge Destination: Home ?Respiratory Rate (breaths/min): 18 ?Transportation: Private Auto ?Blood Pressure (mmHg): 162/99 ?Accompanied By: Alanson Puls ?Schedule Follow-up Appointment: Yes ?Clinical Summary of Care: Provided on 09/24/2021 ?Form Type Recipient ?Paper Patient Patient ?Electronic Signature(s) ?Signed: 09/24/2021 4:43:06 PM By: Lorrin Jackson ?Entered By: Lorrin Jackson on 09/24/2021 11:58:15 ?-------------------------------------------------------------------------------- ?Lower Extremity Assessment Details ?Patient Name: ?Date of Service: ?MO [name] 09/24/2021 11:00 A M ?Medical Record Number: [id number] ?Patient Account Number: [id number] ?Date of Birth/Sex: ?Treating RN: ?[birth year] (51 y.o. [name] ?Primary Care Bettie Swavely: [name] ?Other Clinician: ?Referring Mattelyn Imhoff: ?Treating Caidyn Henricksen/Extender: [name] ?[name], [name] ?Weeks in Treatment: 2 ?Edema Assessment ?Assessed: [Left: No] [Right: Yes] ?Edema: [Left: Ye] [Right: s] ?Calf ?Left: Right: ?Point of Measurement: 33 cm From Medial Instep 35 cm ?Ankle ?Left: Right: ?Point of Measurement: 10 cm From Medial Instep 26 cm ?Vascular Assessment ?Pulses: ?Dorsalis Pedis ?Palpable: [Right:Yes] ?Electronic Signature(s) ?Signed: 09/24/2021 4:43:06 PM By: Lorrin Jackson ?Entered By: Lorrin Jackson on 09/24/2021 11:15:51 ?-------------------------------------------------------------------------------- ?Multi Wound Chart Details ?Patient Name: ?Date of Service: ?MO [name] 09/24/2021 11:00 A M ?Medical Record Number: [id number] ?Patient Account Number: [id number] ?Date of Birth/Sex: ?Treating RN: ?[birth year] (51 y.o. M) ?Primary Care Tejah Brekke: [name] ?Other Clinician: ?Referring Xanthe Couillard: ?Treating Polk Minor/Extender: [name] ?[name], [name] ?Weeks in Treatment: 2 ?Vital Signs ?Height(in):  70 ?Capillary Blood Glucose(mg/dl): 160 ?Weight(lbs): 170 ?Pulse(bpm): 83 ?Body Mass Index(BMI): 24.4 ?Blood Pressure(mmHg): 162/99 ?Temperature(??F): 98.7 ?Respiratory Rate(breaths/min): 18 ?Photos: [8:Right, Lateral Foot] [N/A:N/A N/A] ?Wound Location: [8:Gradually Appeared] [N/A:N/A] ?Wounding Event: [8:Diabetic Wound/Ulcer of the Lower] [N/A:N/A] ?Primary Etiology: [8:Extremity Anemia, Congestive Heart Failure,] [N/A:N/A] ?Comorbid History: [8:Hypertension, Type II Diabetes, End Stage Renal Disease, Osteomyelitis, Neuropathy 08/17/2021] [N/A:N/A] ?Date Acquired: [8:2] [N/A:N/A] ?Weeks of Treatment: [8:Open] [N/A:N/A] ?Wound Status: [8:No] [N/A:N/A] ?Wound Recurrence: [8:0.7x1x0.3] [N/A:N/A] ?Measurements L x W x D (cm) [8:0.55] [N/A:N/A] ?A (cm?) : ?rea [8:0.165] [  N/A:N/A] ?Volume (cm?) : [8:22.20%] [N/A:N/A] ?% Reduction in A [8:rea: 22.20%] [N/A:N/A] ?% Reduction in Volume: [8:Unable to visualize wound bed] [N/A:N/A] ?Classification: [8:Medium] [N/A:N/A] ?Exudate A mount: [8:Serosanguineous] [N/A:N/A] ?Exudate Type: [8:red, brown] [N/A:N/A] ?Exudate Color: [8:Distinct, outline attached] [N/A:N/A] ?Wound Margin: [8:None Present (0%)] [N/A:N/A] ?Granulation A mount: [8:Large (67-100%)] [N/A:N/A] ?Necrotic A mount: ?[8:Fat Layer (Subcutaneous Tissue): Yes N/A] ?Exposed Structures: ?[8:Fascia: No Tendon: No Muscle: No Joint: No Bone: No None] [N/A:N/A] ?Epithelialization: [8:Debridement - Excisional] [N/A:N/A] ?Debridement: ?Pre-procedure Verification/Time Out 11:31 [N/A:N/A] ?Taken: [8:Other] [N/A:N/A] ?Pain Control: [8:Subcutaneous, Slough] [N/A:N/A] ?Tissue Debrided: [8:Skin/Subcutaneous Tissue] [N/A:N/A] ?Level: [8:0.7] [N/A:N/A] ?Debridement A (sq cm): [8:rea Blade, Curette, Forceps] [N/A:N/A] ?Instrument: [8:Minimum] [N/A:N/A] ?Bleeding: [8:Pressure] [N/A:N/A] ?Hemostasis A chieved: [8:Procedure was tolerated well] [N/A:N/A] ?Debridement Treatment Response: [8:0.7x1x0.3] [N/A:N/A] ?Post Debridement  Measurements L x ?W x D (cm) [8:0.165] [N/A:N/A] ?Post Debridement Volume: (cm?) [8:Debridement] [N/A:N/A] ?Treatment Notes ?Wound #8 (Foot) Wound Laterality: Right, Lateral ?Cleanser ?Soap and Water ?Discharge Instruction: May shower and wash wound with dial antibacterial soap and water prior to dressing change. ?Peri-Wound Care ?Topical ?Gentamicin ?Discharge Instruction: Apply directly to wound bed. ?Primary Dressing ?Hydrofera Blue Ready Foam, 2.5 x2.5 in ?Discharge Instruction: Apply to wound bed as instructed ?Secondary Dressing ?Optifoam Non-Adhesive Dressing, 4x4 in ?Discharge Instruction: foam donut in clinic. ?Callous pads ?Discharge Instruction: apply callous pads to periwound. change every day. ?Secured With ?Conforming Stretch Gauze Bandage, Sterile 2x75 (in/in) ?Discharge Instruction: Secure with stretch gauze as directed. ?30M Medipore H Soft Cloth Surgical T ape, 4 x 10 (in/yd) ?Discharge Instruction: Secure with tape as directed. ?Compression Wrap ?Compression Stockings ?Add-Ons ?Electronic Signature(s) ?Signed: 09/24/2021 12:25:31 PM By: [name] DO ?Entered By: [name] on 09/24/2021 12:14:38 ?-------------------------------------------------------------------------------- ?Multi-Disciplinary Care Plan Details ?Patient Name: ?Date of Service: ?MO [name] 09/24/2021 11:00 A M ?Medical Record Number: [id number] ?Patient Account Number: [id number] ?Date of Birth/Sex: ?Treating RN: ?[birth year] (51 y.o. [name] ?Primary Care Talma Aguillard: [name] ?Other Clinician: ?Referring Marland Reine: ?Treating Raguel Kosloski/Extender: [name] ?[name], [name] ?Weeks in Treatment: 2 ?Active Inactive ?Nutrition ?Nursing Diagnoses: ?Potential for alteratiion in Nutrition/Potential for imbalanced nutrition ?Goals: ?Patient/caregiver agrees to and verbalizes understanding of need to obtain nutritional consultation ?Date Initiated: 09/08/2021 ?Target Resolution Date: 10/09/2021 ?Goal  Status: Active ?Patient/caregiver will maintain therapeutic glucose control ?Date Initiated: 09/08/2021 ?Target Resolution Date: 10/08/2021 ?Goal Status: Active ?Interventions: ?Assess HgA1c results as ordered upon admission and as n

## 2021-09-25 ENCOUNTER — Other Ambulatory Visit (HOSPITAL_COMMUNITY): Payer: Self-pay

## 2021-09-29 ENCOUNTER — Encounter (HOSPITAL_BASED_OUTPATIENT_CLINIC_OR_DEPARTMENT_OTHER): Payer: Medicaid Other | Admitting: Internal Medicine

## 2021-09-29 ENCOUNTER — Other Ambulatory Visit: Payer: Self-pay

## 2021-09-29 DIAGNOSIS — Z89512 Acquired absence of left leg below knee: Secondary | ICD-10-CM | POA: Diagnosis not present

## 2021-09-29 DIAGNOSIS — Z794 Long term (current) use of insulin: Secondary | ICD-10-CM | POA: Diagnosis not present

## 2021-09-29 DIAGNOSIS — I5042 Chronic combined systolic (congestive) and diastolic (congestive) heart failure: Secondary | ICD-10-CM | POA: Diagnosis not present

## 2021-09-29 DIAGNOSIS — N184 Chronic kidney disease, stage 4 (severe): Secondary | ICD-10-CM | POA: Diagnosis not present

## 2021-09-29 DIAGNOSIS — E11621 Type 2 diabetes mellitus with foot ulcer: Secondary | ICD-10-CM | POA: Diagnosis not present

## 2021-09-29 DIAGNOSIS — L97512 Non-pressure chronic ulcer of other part of right foot with fat layer exposed: Secondary | ICD-10-CM

## 2021-09-29 DIAGNOSIS — I132 Hypertensive heart and chronic kidney disease with heart failure and with stage 5 chronic kidney disease, or end stage renal disease: Secondary | ICD-10-CM | POA: Diagnosis not present

## 2021-09-29 NOTE — Progress Notes (Addendum)
TORRANCE, STOCKLEY ([id number]) ?Visit Report for 09/29/2021 ?Arrival Information Details ?Patient Name: Date of Service: ?MO [name] 09/29/2021 11:00 A M ?Medical Record Number: [id number] ?Patient Account Number: [id number] ?Date of Birth/Sex: Treating RN: ?24-Jul-1970 (51 y.o. Male) [name], [name] ?Primary Care Kalyna Paolella: [name] Other Clinician: ?Referring [place] Searles: ?Treating Devan Danzer/Extender: [name] ?[name], [name] ?Weeks in Treatment: 3 ?Visit Information History Since Last Visit ?Added or deleted any medications: No ?Patient Arrived: Wheel Chair ?Any new allergies or adverse reactions: No ?Arrival Time: 11:38 ?Had a fall or experienced change in No ?Accompanied By: family ?activities of daily living that may affect ?Transfer Assistance: Manual ?risk of falls: ?Patient Identification Verified: Yes ?Signs or symptoms of abuse/neglect since No ?Secondary Verification Process Completed: Yes last visito ?Patient Requires Transmission-Based Precautions: No ?Hospitalized since last visit: No ?Patient Has Alerts: No ?Implantable device outside of the clinic No ?excluding ?cellular tissue based products placed in the ?center ?since last visit: ?Has Footwear/Offloading in Place as Yes ?Prescribed: ?Right: Surgical Shoe with Pressure Relief Insole ?Pain Present Now: No ?Electronic Signature(s) ?Signed: 09/29/2021 4:42:50 PM By: [name], [name] ?Entered By: Deon Pilling on 09/29/2021 11:38:57 ?-------------------------------------------------------------------------------- ?Encounter Discharge Information Details ?Patient Name: Date of Service: ?MO [name] 09/29/2021 11:00 A M ?Medical Record Number: [id number] ?Patient Account Number: [id number] ?Date of Birth/Sex: Treating RN: ?[birth year] (51 y.o. Male) Lorrin Jackson ?Primary Care Kadien Lineman: [name] Other Clinician: ?Referring Tashawn Greff: ?Treating Moana Munford/Extender: [name] ?[name], [name] ?Weeks in  Treatment: 3 ?Encounter Discharge Information Items Post Procedure Vitals ?Discharge Condition: Stable ?Temperature (F): 97.8 ?Ambulatory Status: Wheelchair ?Pulse (bpm): 79 ?Discharge Destination: Home ?Respiratory Rate (breaths/min): 20 ?Transportation: Private Auto ?Blood Pressure (mmHg): 148/88 ?Schedule Follow-up Appointment: Yes ?Clinical Summary of Care: Provided on 09/29/2021 ?Form Type Recipient ?Paper Patient Patient ?Electronic Signature(s) ?Signed: 09/29/2021 4:30:08 PM By: Lorrin Jackson ?Entered By: Lorrin Jackson on 09/29/2021 12:04:51 ?-------------------------------------------------------------------------------- ?Lower Extremity Assessment Details ?Patient Name: ?Date of Service: ?MO [name] 09/29/2021 11:00 A M ?Medical Record Number: [id number] ?Patient Account Number: [id number] ?Date of Birth/Sex: ?Treating RN: ?26-Jun-1971 (51 y.o. Male) [name], [name] ?Primary Care Corynne Scibilia: [name] ?Other Clinician: ?Referring Daesean Lazarz: ?Treating Lorin Gawron/Extender: [name] ?[name], [name] ?Weeks in Treatment: 3 ?Edema Assessment ?Assessed: [Left: No] [Right: Yes] ?Edema: [Left: Ye] [Right: s] ?Calf ?Left: Right: ?Point of Measurement: 33 cm From Medial Instep 33 cm ?Ankle ?Left: Right: ?Point of Measurement: 10 cm From Medial Instep 23 cm ?Vascular Assessment ?Pulses: ?Dorsalis Pedis ?Palpable: [Right:Yes] ?Electronic Signature(s) ?Signed: 09/29/2021 4:42:50 PM By: [name], [name] ?Entered By: Deon Pilling on 09/29/2021 11:43:56 ?-------------------------------------------------------------------------------- ?Multi Wound Chart Details ?Patient Name: ?Date of Service: ?MO [name] 09/29/2021 11:00 A M ?Medical Record Number: [id number] ?Patient Account Number: [id number] ?Date of Birth/Sex: ?Treating RN: ?11-Jun-1971 (51 y.o. Male) ?Primary Care Molina Hollenback: [name] ?Other Clinician: ?Referring Lillyth Spong: ?Treating Nefertari Rebman/Extender: [name] ?[name],  [name] ?Weeks in Treatment: 3 ?Vital Signs ?Height(in): 70 ?Capillary Blood Glucose(mg/dl): 165 ?Weight(lbs): 170 ?Pulse(bpm): 79 ?Body Mass Index(BMI): 24.4 ?Blood Pressure(mmHg): 148/88 ?Temperature(??F): 97.8 ?Respiratory Rate(breaths/min): 20 ?Photos: [8:Right, Lateral Foot] [N/A:N/A N/A] ?Wound Location: [8:Gradually Appeared] [N/A:N/A] ?Wounding Event: [8:Diabetic Wound/Ulcer of the Lower] [N/A:N/A] ?Primary Etiology: [8:Extremity Anemia, Congestive Heart Failure,] [N/A:N/A] ?Comorbid History: [8:Hypertension, Type II Diabetes, End Stage Renal Disease, Osteomyelitis, Neuropathy 08/17/2021] [N/A:N/A] ?Date Acquired: [8:3] [N/A:N/A] ?Weeks of Treatment: [8:Open] [N/A:N/A] ?Wound Status: [8:No] [N/A:N/A] ?Wound Recurrence: [8:0.8x0.9x0.3] [N/A:N/A] ?Measurements L x W x D (  cm) [8:0.565] [N/A:N/A] ?A (cm?) : ?rea [8:0.17] [N/A:N/A] ?Volume (cm?) : [8:20.10%] [N/A:N/A] ?% Reduction in A [8:rea: 19.80%] [N/A:N/A] ?% Reduction in Volume: [8:Unable to visualize wound bed] [N/A:N/A] ?Classification: [8:Medium] [N/A:N/A] ?Exudate A mount: [8:Serosanguineous] [N/A:N/A] ?Exudate Type: [8:red, brown] [N/A:N/A] ?Exudate Color: [8:Distinct, outline attached] [N/A:N/A] ?Wound Margin: [8:None Present (0%)] [N/A:N/A] ?Granulation A mount: [8:Large (67-100%)] [N/A:N/A] ?Necrotic A mount: ?[8:Fat Layer (Subcutaneous Tissue): Yes N/A] ?Exposed Structures: ?[8:Fascia: No Tendon: No Muscle: No Joint: No Bone: No None] [N/A:N/A] ?Epithelialization: [8:Debridement - Excisional] [N/A:N/A] ?Debridement: ?Pre-procedure Verification/Time Out 11:49 [N/A:N/A] ?Taken: [8:Lidocaine 5% topical ointment] [N/A:N/A] ?Pain Control: [8:Callus, Subcutaneous, Slough] [N/A:N/A] ?Tissue Debrided: [8:Skin/Subcutaneous Tissue] [N/A:N/A] ?Level: [8:0.72] [N/A:N/A] ?Debridement A (sq cm): [8:rea Curette] [N/A:N/A] ?Instrument: [8:Minimum] [N/A:N/A] ?Bleeding: [8:Pressure] [N/A:N/A] ?Hemostasis A chieved: [8:Procedure was tolerated well]  [N/A:N/A] ?Debridement Treatment Response: [8:0.8x0.9x0.3] [N/A:N/A] ?Post Debridement Measurements L x ?W x D (cm) [8:0.17] [N/A:N/A] ?Post Debridement Volume: (cm?) [8:Debridement] [N/A:N/A] ?Treatment Notes ?Wound #8 (Foot) Wound Laterality: Right, Lateral ?Cleanser ?Soap and Water ?Discharge Instruction: May shower and wash wound with dial antibacterial soap and water prior to dressing change. ?Peri-Wound Care ?Topical ?Gentamicin ?Discharge Instruction: Apply directly to wound bed. ?Primary Dressing ?Hydrofera Blue Ready Foam, 2.5 x2.5 in ?Discharge Instruction: Apply to wound bed as instructed ?Secondary Dressing ?Optifoam Non-Adhesive Dressing, 4x4 in ?Discharge Instruction: foam donut in clinic. ?Callous pads ?Discharge Instruction: apply callous pads to periwound. change every day. ?Secured With ?Conforming Stretch Gauze Bandage, Sterile 2x75 (in/in) ?Discharge Instruction: Secure with stretch gauze as directed. ?35M Medipore H Soft Cloth Surgical T ape, 4 x 10 (in/yd) ?Discharge Instruction: Secure with tape as directed. ?Compression Wrap ?Compression Stockings ?Add-Ons ?Electronic Signature(s) ?Signed: 09/29/2021 12:42:05 PM By: [name] DO ?Entered By: [name] on 09/29/2021 12:37:11 ?-------------------------------------------------------------------------------- ?Multi-Disciplinary Care Plan Details ?Patient Name: ?Date of Service: ?MO [name] 09/29/2021 11:00 A M ?Medical Record Number: [id number] ?Patient Account Number: [id number] ?Date of Birth/Sex: ?Treating RN: ?October 15, 1970 (51 y.o. Male) Lorrin Jackson ?Primary Care Feige Lowdermilk: [name] ?Other Clinician: ?Referring Michaelah Credeur: ?Treating Teresita Fanton/Extender: [name] ?[name], [name] ?Weeks in Treatment: 3 ?Active Inactive ?Electronic Signature(s) ?Signed: 11/13/2021 10:23:47 AM By: Lorrin Jackson ?Previous Signature: 09/29/2021 4:30:08 PM Version By: Lorrin Jackson ?Entered By: Lorrin Jackson on 11/13/2021  10:23:47 ?-------------------------------------------------------------------------------- ?Pain Assessment Details ?Patient Name: ?Date of Service: ?MO [name] 09/29/2021 11:00 A M ?Medical Record Number: [id number] ?P

## 2021-09-29 NOTE — Progress Notes (Signed)
Todd Mccoy, Todd Mccoy ([id number]) ?Visit Report for 09/29/2021 ?Chief Complaint Document Details ?Patient Name: Date of Service: ?[name] 09/29/2021 11:00 A M ?Medical Record Number: [id number] ?Patient Account Number: [id number] ?Date of Birth/Sex: Treating RN: ?[birth year] (51 y.o. M) ?Primary Care Provider: Charlott Mccoy Other Clinician: ?Referring Provider: ?Treating Provider/Extender: Todd Mccoy ?Todd Mccoy ?Weeks in Treatment: 3 ?Information Obtained from: Patient ?Chief Complaint ?11/20/2019; patient is here with for review of multiple wounds over the right lower leg, right great and right second toe ?09/08/2021; patient presents for wound to the right lateral foot ?Electronic Signature(s) ?Signed: 09/29/2021 12:42:05 PM By: Todd Mccoy ?Entered By: Todd Mccoy on 09/29/2021 12:37:23 ?-------------------------------------------------------------------------------- ?Debridement Details ?Patient Name: Date of Service: ?[name] 09/29/2021 11:00 A M ?Medical Record Number: [id number] ?Patient Account Number: [id number] ?Date of Birth/Sex: Treating RN: ?01/12/1971 (51 y.o. [name] ?Primary Care Provider: Charlott Mccoy Other Clinician: ?Referring Provider: ?Treating Provider/Extender: Todd Mccoy ?Todd Mccoy ?Weeks in Treatment: 3 ?Debridement Performed for Assessment: Wound #8 Right,Lateral Foot ?Performed By: Physician Todd Shan, Mccoy ?Debridement Type: Debridement ?Severity of Tissue Pre Debridement: Fat layer exposed ?Level of Consciousness (Pre-procedure): Awake and Alert ?Pre-procedure Verification/Time Out Yes - 11:49 ?Taken: ?Start Time: 11:50 ?Pain Control: Lidocaine 5% topical ointment ?T Area Debrided (L x W): ?otal 0.8 (cm) x 0.9 (cm) = 0.72 (cm?) ?Tissue and other material debrided: Non-Viable, Callus, Slough, Subcutaneous, Slough ?Level: Skin/Subcutaneous Tissue ?Debridement Description: Excisional ?Instrument:  Curette ?Bleeding: Minimum ?Hemostasis Achieved: Pressure ?End Time: 11:55 ?Response to Treatment: Procedure was tolerated well ?Level of Consciousness (Post- Awake and Alert ?procedure): ?Post Debridement Measurements of Total Wound ?Length: (cm) 0.8 ?Width: (cm) 0.9 ?Depth: (cm) 0.3 ?Volume: (cm?) 0.17 ?Character of Wound/Ulcer Post Debridement: Stable ?Severity of Tissue Post Debridement: Fat layer exposed ?Post Procedure Diagnosis ?Same as Pre-procedure ?Electronic Signature(s) ?Signed: 09/29/2021 12:42:05 PM By: Todd Mccoy ?Signed: 09/29/2021 4:30:08 PM By: Todd Mccoy ?Entered By: Todd Mccoy on 09/29/2021 11:55:43 ?-------------------------------------------------------------------------------- ?HPI Details ?Patient Name: Date of Service: ?[name] 09/29/2021 11:00 A M ?Medical Record Number: [id number] ?Patient Account Number: [id number] ?Date of Birth/Sex: Treating RN: ?1971-06-08 (51 y.o. M) ?Primary Care Provider: Charlott Mccoy Other Clinician: ?Referring Provider: ?Treating Provider/Extender: Todd Mccoy ?Todd Mccoy ?Weeks in Treatment: 3 ?History of Present Illness ?HPI Description: ADMISSION ?11/20/2019; this is a 51 year old man with poorly controlled type 2 diabetes. Roughly 3 to 4 weeks ago he noted swelling of his leg open wounds with draining ?sores. This happened fairly suddenly. He was seen in the ER on 11/07/2019 noted to have wounds of the right lower extremity x2 weeks at that point. An x-ray of ?the area was negative one of the wounds was cultured showing MSSA and group B strep. He was noted to have multiple wounds. He was given a course of ?doxycycline for 7 days white count was 8.4 hemoglobin 9.7. Since then he has been washing the leg and applying dry gauze. He is here in the clinic for review ?of this. 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 At that point his culture was MRSA. No no ?11/27/19-Patient returns after starting in the clinic for open leg wounds on his right and his right second toe. We have been applying silver alginate and 3 layer ?compression on the  right. Patient completed course of doxycycline. The wounds are looking slightly smaller, the right posterior wound appears to have healed ?6/8; patient is here in follow-up for open wounds on his right leg and his right second toe. X-ray I did last time was strangely negative. I gave him a course of ?Keflex which she is completed the area on the tip of the second toe is actually close over. All of the wounds on his right anterior lateral and posterior leg are ?considerably better. There is still open wounds on the right lateral and right posterior but they are measuring better. These were felt to be abscesses for which ?she was hospitalized in early May. Cultures originally showed MSSA and group B strep ?6/25; patient is here for review of wounds on the right lower leg which were felt to be abscesses. He also had an area on his left second toe when he came in ?this is closed over. Almost all the areas on the right leg are closed 2 small open areas remain. We use silver alginate to these wounds and let them wear his ?own stockings. Should be closed the next time he is here ?Readmission 09/08/2021 ?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 Chronic kidney disease stage IV and combined chronic systolic and diastolic heart failure that presents to the clinic for a 1 month history of ?open wound to the right lateral foot. He states he visited the ED on 08/18/2021 for this issue and was prescribed doxycycline. He states that the wound became ?slightly smaller after taking this. He has been using AandE ointment to the wound bed. He denies signs of  infection. ?3/23; patient presents for follow-up. He had a wound culture at last clinic visit that showed moderate Staph aureus and was started on Augmentin. He has been ?using gentamicin ointment to the wound bed. He currently denies systemic signs of infection. ?3/28; patient presents for follow-up. He has completed his course of antibiotics. He continues to use gentamicin with Hydrofera Blue to the wound bed. He has ?no issues or complaints today. ?Electronic Signature(s) ?Signed: 09/29/2021 12:42:05 PM By: Todd Mccoy ?Entered By: Todd Mccoy on 09/29/2021 12:37:45 ?-------------------------------------------------------------------------------- ?Physical Exam Details ?Patient Name: Date of Service: ?[name] 09/29/2021 11:00 A M ?Medical Record Number: [id number] ?Patient Account Number: [id number] ?Date of Birth/Sex: Treating RN: ?[birth year] (51 y.o. M) ?Primary Care Provider: Charlott Mccoy Other Clinician: ?Referring Provider: ?Treating Provider/Extender: Todd Mccoy ?Todd Mccoy ?Weeks in Treatment: 3 ?Constitutional ?respirations regular, non-labored and within target range for patient.Marland Kitchen ?Cardiovascular ?2+ dorsalis pedis/posterior tibialis pulses. ?Psychiatric ?pleasant and cooperative. ?Notes ?Right foot: T the lateral aspect there is an open wound with granulation tissue and nonviable tissue present. No surrounding signs of infection. ?o ?Electronic Signature(s) ?Signed: 09/29/2021 12:42:05 PM By: Todd Mccoy ?Entered By: Todd Mccoy on 09/29/2021 12:38:17 ?-------------------------------------------------------------------------------- ?Physician Orders Details ?Patient Name: Date of Service: ?[name] 09/29/2021 11:00 A M ?Medical Record Number: [id number] ?Patient Account Number: [id number] ?Date of Birth/Sex: Treating RN: ?1971-04-21 (51 y.o. 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

## 2021-09-30 ENCOUNTER — Other Ambulatory Visit: Payer: Self-pay

## 2021-09-30 NOTE — Patient Outreach (Signed)
?Medicaid Managed Care   ?Nurse Care Manager Note ? ?09/30/2021 ?Name:  Todd Mccoy MRN:  [id number] DOB:  11-Jul-1970 ? ?Todd Mccoy is an 51 y.o. year old male who is a primary patient of [name], MD.  [facility] Managed Care Coordination team was consulted for assistance with:    ?CHF ?HTN ?DMII ? ?Todd Mccoy was given information about Medicaid Managed Care Coordination team services today. [name] Patient agreed to services and verbal consent obtained. ? ?Engaged with patient by telephone for initial visit in response to provider referral for case management and/or care coordination services.  ? ?Assessments/Interventions:  Review of past medical history, allergies, medications, health status, including review of consultants reports, laboratory and other test data, was performed as part of comprehensive evaluation and provision of chronic care management services. ? ?SDOH (Social Determinants of Health) assessments and interventions performed: ?SDOH Interventions   ? ?Flowsheet Row Most Recent Value  ?SDOH Interventions   ?Food Insecurity Interventions Intervention Not Indicated  ?Financial Strain Interventions Intervention Not Indicated  ?Housing Interventions Intervention Not Indicated  ?Physical Activity Interventions Intervention Not Indicated  ?Stress Interventions Intervention Not Indicated  ?Social Connections Interventions Intervention Not Indicated  ?Transportation Interventions Intervention Not Indicated  ? ?  ? ? ?Care Plan ? ?No Known Allergies ? ?Medications Reviewed Today   ? ? Reviewed by Inge Rise, RN (Case Manager) on 09/30/21 at 1310  Med List Status: <None>  ? ?Medication Order Taking? Sig Documenting Provider Last Dose Status Informant  ?acetaminophen (TYLENOL) 325 MG tablet [id number] Yes Take 650 mg by mouth every 6 (six) hours as needed for mild pain. [provider] Taking Active Self  ?amLODipine (NORVASC) 10 MG tablet [id number]  Yes Take 1 tablet (10 mg total) by mouth daily. [name], MD Taking Active   ?atorvastatin (LIPITOR) 20 MG tablet [id number] Yes Take 1 tablet (20 mg total) by mouth daily. [name], MD Taking Active   ?carvedilol (COREG) 25 MG tablet [id number] Yes Take 1 tablet (25 mg total) by mouth 2 (two) times daily. [name], MD Taking Active   ?doxycycline (VIBRAMYCIN) 100 MG capsule [id number] No Take 1 capsule (100 mg total) by mouth 2 (two) times daily.  ?Patient not taking: Reported on 09/30/2021  ? Rodriguez-Southworth, Sunday Spillers, PA-C Not Taking Active   ?         ?Med Note [facility], Tailey Top A   Wed Sep 30, 2021  9:17 AM) Completed  ?ferrous sulfate 325 (65 FE) MG tablet [id number]  Take 1 tablet (325 mg total) by mouth 3 (three) times daily with meals.  ?Patient taking differently: Take 325 mg by mouth in the morning and at bedtime.  ? Deatra James, MD  Expired 04/11/21 2359   ?furosemide (LASIX) 80 MG tablet [id number] Yes Take 1 tablet (80 mg total) by mouth daily. [name], MD Taking Active   ?hydrALAZINE (APRESOLINE) 100 MG tablet [id number] Yes Take 1 tablet (100 mg total) by mouth 3 (three) times daily. Kathie Dike, MD Taking Active   ?hydrOXYzine (ATARAX) 25 MG tablet [id number] Yes TAKE 1 TABLET BY MOUTH AT BEDTIME AS NEEDED [name], MD Taking Active   ?  Discontinued 05/13/20 1120   ?insulin glargine (LANTUS) 100 UNIT/ML Solostar Pen [id number] Yes Inject 10 Units into the skin at bedtime. Kathie Dike, MD Taking Active   ?Insulin Pen Needle 32G X 4 MM MISC [id number] Yes USE TO INJECT INSULIN DAILY. Shahmehdi, Seyed A,  MD Taking Active Self  ?isosorbide mononitrate (IMDUR) 60 MG 24 hr tablet [id number] Yes Take 1 tablet (60 mg total) by mouth daily. [name], MD Taking Active   ?  Discontinued 12/22/20 1205 (Stop Taking at Discharge)   ?  Discontinued 12/22/20 1205 (Stop Taking at Discharge)   ?Naphazoline HCl (CLEAR EYES OP) [id number] Yes Place 1 drop into both  eyes as needed (irritation). [provider] Taking Active Self  ?oxyCODONE (OXY IR/ROXICODONE) 5 MG immediate release tablet [id number] Yes Take 1 tablet (5 mg total) by mouth every 12 (twelve) hours as needed for moderate pain (pain score 4-6). Suzan Slick, NP Taking Active   ?polyethylene glycol powder (GLYCOLAX/MIRALAX) 17 GM/SCOOP powder [id number] No Take 17 g by mouth 2 (two) times daily as needed.  ?Patient not taking: Reported on 09/30/2021  ? [name], MD Not Taking Active Self  ?pregabalin (LYRICA) 50 MG capsule [id number] Yes Take 1 capsule (50 mg total) by mouth 2 (two) times daily. Suzan Slick, NP Taking Active   ? ?  ?  ? ?  ? ? ?Patient Active Problem List  ? Diagnosis Date Noted  ? Chronic combined systolic (congestive) and diastolic (congestive) heart failure ([place]) 04/07/2021  ? Chronic renal disease, stage 4, severely decreased glomerular filtration rate between 15-29 mL/min/1.73 square meter (HCC)   ? Hypertensive urgency   ? AKI (acute kidney injury) ([place])   ? Acute CHF (congestive heart failure) ([place]) 02/27/2021  ? Normocytic anemia 02/27/2021  ? Anasarca 02/27/2021  ? Abscess of left foot 12/10/2020  ? S/P BKA (below knee amputation) unilateral, left ([place]) 12/10/2020  ? Dyslipidemia 12/10/2020  ? CKD (chronic kidney disease), stage III ([place]) 12/10/2020  ? Subacute osteomyelitis, left ankle and foot ([place])   ? Osteomyelitis of fifth toe of left foot ([place])   ? Severe protein-calorie malnutrition ([place])   ? HTN (hypertension) 11/12/2020  ? Osteomyelitis of second toe of right foot ([place])   ? Cellulitis and abscess of foot   ? Toe osteomyelitis ([place]) 03/24/2020  ? Necrotizing fasciitis ([place]) 09/08/2014  ? Diabetes mellitus with hyperglycemia ([place]) 09/07/2014  ? Tobacco abuse 09/07/2014  ? Abscess of back   ? Abscess of lower back 09/06/2014  ? DM2 (diabetes mellitus, type 2) ([place]) 09/06/2014  ? Sepsis ([place]) 09/06/2014  ? Scrotal abscess 06/20/2014  ? ? ?Conditions to be  addressed/monitored per PCP order:  CHF, HTN, and DMII ? ?Care Plan : RN Care Manager Plan of Care  ?Updates made by Inge Rise, RN since 09/30/2021 12:00 AM  ?  ? ?Problem: Chronic Disease Management and Care Coordination Needs for CHF, HTN, DM   ?Priority: High  ?  ? 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 ? ?RNCM Clinical Goal(s):  ?Patient will verbalize understanding of plan for management of CHF, HTN, and DMII as evidenced by improvement in management of these chronic diseases. ?verbalize basic understanding of CHF, HTN, and DMII disease process and self health management plan as evidenced by improved control of factors  that cause CHF, improved blood pressure readings < 140/90 and improved control of blood sugar readings with range with 80 - 120. ?take all medications exactly as prescribed and will call provider for medication related questions as evidenced by compliance with medications3/31/    ?attend all scheduled medical appointments: 10/02/2021 Cardiologist; 10/09/2021 Wound Center as evidenced by attending all scheduled appointments        ?demonstrate improved adherence to prescribed treatment plan for CHF, HTN, and DMII as evidenced by better control of blood pressure, blood sugars and factors impacting CHF. ?continue to work with Consulting civil engineer and/or Social Worker to address care management and care coordination needs related to CHF, HTN, and DMII as evidenced by adherence to CM Team Scheduled appointments     through  collaboration with RN Care manager, provider, and care team.  ? ?Interventions: ?Inter-disciplinary care team collaboration (see longitudinal plan of care) ?Evaluation of current treatment plan related to  self managem

## 2021-09-30 NOTE — Patient Instructions (Signed)
Visit Information ? ?Todd Mccoy was given information about Medicaid Managed Care team care coordination services as a part of their [facility] Medicaid benefit. Todd Mccoy verbally consented to engagement with the [facility] Managed Care team.  ? ?If you are experiencing a medical emergency, please call 911 or report to your local emergency department or urgent care.  ? ?If you have a non-emergency medical problem during routine business hours, please contact your provider's office and ask to speak with a nurse.  ? ?For questions related to your [facility] health plan, please call: [phone] or go here:[email or web address] ? ?If you would like to schedule transportation through your [facility] plan, please call the following number at least 2 days in advance of your appointment: [phone]. ? You can also use the MTM portal or MTM mobile app to manage your rides. For the portal, please go to [email or web address]. ? ?Call the [facility] at [phone], at any time, 24 hours a day, 7 days a week. If you are in danger or need immediate medical attention call 911. ? ?If you would like help to quit smoking, call 1-800-QUIT-NOW 416-780-0790) OR Espa?ol: 1-855-D?jelo-Ya 412-083-4090) o para m?s informaci?n haga clic aqu? or Text READY to 200-400 to register via text ? ?Todd Mccoy - following are the goals we discussed in your visit today:  Please see Patient Goals in [place] of Care below. ? ?Please see education materials related to today's visit provided as print materials.  ? ?The patient verbalized understanding of instructions, educational materials, and care plan provided today and agreed to receive a mailed copy of patient instructions, educational materials, and care plan.  ? ?The Managed Medicaid care management team will reach out to the patient again over the next 30 days.  ? ?Todd Marvel RN, [name] ?Community Care Coordinator ?[facility] ?Mobile: [phone]  ? ?Following is a copy of your plan of care:  ?Care Plan : [place] of Care  ?Updates made by Inge Rise, RN since 09/30/2021 12:00 AM  ?  ? ?Problem: Chronic Disease Management and Care Coordination Needs for CHF, HTN, DM   ?Priority: High  ?  ? 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 ? ?RNCM Clinical Goal(s):  ?Patient will verbalize understanding of plan for management of CHF, HTN, and DMII as evidenced by improvement in management of these chronic diseases. ?verbalize basic understanding of CHF, HTN, and DMII disease process and self health management plan as evidenced by improved control of factors that cause CHF, improved blood pressure readings < 140/90 and improved control of blood sugar readings with range with 80 - 120. ?take all medications exactly as prescribed and will call provider for medication related questions as evidenced by compliance with medications3/31/    ?attend all scheduled medical appointments: 10/02/2021 Cardiologist; 10/09/2021 Wound Center as evidenced by attending all scheduled appointments        ?demonstrate improved adherence to prescribed treatment plan for CHF, HTN, and DMII as evidenced by  better control of blood pressure, blood sugars and factors impacting CHF. ?continue to work with Consulting civil engineer and/or Social Worker to address care management and care coordination needs related to CHF, HTN, and DMII as evidenced by adherence to  CM Team Scheduled appointments     through collaboration with RN Care manager, provider, and care team.  ? ?Interventions: ?Inter-disciplinary care team collaboration (see longitudinal plan of care) ?Evaluation of current treatment plan related to  self management and patient's adherence to plan as established by provider ? ? ?Heart Failure Interventions:  (Status: New goal.)  Long Term Goal  ?Wt Readings from Last 3 Encounters:  ?04/28/21 180 lb (81.6 kg)  ?04/23/21 180 lb (81.6 kg)  ?04/07/21 180 lb (81.6 kg)  ? ?Basic overview and discussion of pathophysiology of Heart Failure reviewed ?Provided education on low sodium diet ?Assessed need for readable accurate scales in home ?Reviewed role of diuretics in prevention of fluid overload and management of heart failure ?Discussed the importance of keeping all appointments with provider ?Provided patient with education about the role of exercise in the management of heart failure ?Screening for signs and symptoms of depression related to chronic disease state  ?Assessed social determinant of health barriers ?Patient reports he has difficulty weighing himself at home due to vision issues and Left BKA.   ?Patient reports occasional swelling of right lower extremity and left BKA stump.  ? ?Diabetes:  (Status: New goal.) Long Term Goal  ? ?Lab Results  ?Component Value Date  ? HGBA1C 7.2 (H) 02/27/2021  ? @ ?Assessed patient's understanding of A1c goal: <7% ?Provided education to patient about basic DM disease process; ?Reviewed medications with patient and discussed importance of medication adherence;        ?Counseled on importance of regular laboratory monitoring as prescribed;        ?Discussed plans with patient for ongoing care management follow up and provided patient with direct contact information for care management team;      ?Reviewed scheduled/upcoming provider appointments including: See RNCM Clinical Goals above;         ?Advised patient, providing  education and rationale, to check cbg once daily and record        ?call provider for findings outside established parameters;       ?Screening for signs and symptoms of depression related to chronic disease state;        ?Assessed social determinant of health barriers;        ?Currently patient has Right lateral foot wound.  He receives treatment at the [facility] 1 time/week.  Patient reports noted improvement in right foot wound.   ?Provided education on the importance of maintaining blood sugars within normal limits (80 - 120) and how elevated blood sugars can impact wound healing. ? ?Hypertension: (Status: New goal.) Long Term Goal  ?Last practice recorded BP readings:  ?BP Readings from Last 3 Encounters:  ?08/18/21 (!) 160/89  ?05/21/21 140/80  ?05/05/21 129/79  ?Most recent eGFR/CrCl:  ?Lab Results  ?Component Value Date  ? EGFR 31 (L) 05/21/2021  ?  No components found for: CRCL ? ?Evaluation of current treatment plan related to hypertension self management and patient's adherence to plan as established by provider;   ?Reviewed medications with patient and discussed importance of compliance;  ?Counseled on the importance of exercise goals with target of 150 minutes per week ?Discussed plans with patient for ongoing care management follow up and provided patient with direct contact information for care  management team; ?Advised patient, providing education and rationale, to monitor blood pressure daily and record, calling PCP for findings outside established parameters;  ?Reviewed scheduled/upcoming provider appointments including:  ?Screening for signs and symptoms of depression related to chronic disease state;  ?Assessed social determinant of health barriers;  ? ?Patient Goals/Self-Care Activities: ?Take medications as prescribed   ?Attend all scheduled provider appointments ?Call pharmacy for medication refills 3-7 days in advance of running out of medications ?Call provider office for new concerns or  questions  ?check blood sugar at prescribed times: once daily ?check blood pressure daily ?limit salt intake to 2000 mg/day ?  ?  ?  ?

## 2021-10-01 NOTE — Progress Notes (Deleted)
? ?Cardiology Office Note:   ? ?Date:  10/01/2021  ? ?ID:  Todd Mccoy, DOB 1970/09/01, MRN [id number] ? ?PCP:  [name], MD  ?Cardiologist:  Fransico Him, MD   ? ?Referring MD: [name], MD  ? ?No chief complaint on file. ? ? ? ?History of Present Illness:   ? ?Todd Mccoy is a 51 y.o. 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  2D echo showed EF 40% with G3DD.  He is on Carvedilol, Imdur and Hydralazine.   ? ?He is here today for followup and is doing well.  He denies any chest pain or pressure, SOB, DOE, PND, orthopnea, LE edema, dizziness, palpitations or syncope. He is compliant with his meds and is tolerating meds with no SE.    ?Past Medical History:  ?Diagnosis Date  ? Anemia   ? Anemia   ? Anxiety   ? Chronic combined systolic (congestive) and diastolic (congestive) heart failure (HCC)   ? EF 40-45% with G3DD on echo 2022  ? CKD (chronic kidney disease), stage IV ([place]) 12/10/2020  ? Diabetes mellitus with complication ([place])   ? Diabetic neuropathy ([place])   ? 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[place]) 03/25/2020  ? pt states retinal eye damage- left worse than the right- recent visited MD   ? 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[place]) 03/25/2020  ? pt states recent MD visit /left eye worse than right  ? Dyslipidemia 12/10/2020  ? Hx of BKA, left ([place])   ? Hypertension   ? Morbid obesity ([place])   ? Osteomyelitis ([place])   ? Pneumonia   ? ? ?Past Surgical History:  ?Procedure Laterality Date  ? ABSCESS DRAINAGE    ? neck  ? AMPUTATION Left 11/14/2020  ? Procedure: LEFT 5TH RAY AMPUTATION;  Surgeon: Newt Minion, MD;  Location: [facility];  Service: Orthopedics;  Laterality: Left;  ?  AMPUTATION Left 12/10/2020  ? Procedure: LEFT BELOW KNEE AMPUTATION;  Surgeon: Newt Minion, MD;  Location: [facility];  Service: Orthopedics;  Laterality: Left;  ? AMPUTATION TOE Right 03/25/2020  ? Procedure: AMPUTATION TOE;  Surgeon: Evelina Bucy, DPM;  Location: [facility];  Service: Podiatry;  Laterality: Right;  ? INCISION AND DRAINAGE ABSCESS Right 09/07/2014  ? Procedure: INCISION AND DRAINAGE ABSCESS RIGHT FLANK;  Surgeon: Jackolyn Confer, MD;  Location: [facility];  Service: General;  Laterality: Right;  ? LEG AMPUTATION BELOW KNEE Left 12/10/2020  ? SCROTUM EXPLORATION    ? ? ?Current Medications: ?No outpatient medications have been marked as taking for the 10/02/21 encounter (Appointment) with [name], MD.  ?  ? ?Allergies:   Patient has no known allergies.  ? ?Social History  ? ?Socioeconomic History  ? Marital status: Single  ?  Spouse name: Not on file  ? Number of children: 1  ? Years of education: Not on file  ? Highest education level: Not on file  ?Occupational History  ? Not on file  ?Tobacco Use  ? Smoking status: Former  ?  Packs/day: 1.00  ?  Years: 32.00  ?  Pack years: 32.00  ?  Types: Cigarettes  ?  Quit date: 08/24/2021  ?  Years since  quitting: 0.1  ?  Passive exposure: Past  ? Smokeless tobacco: Never  ? Tobacco comments:  ?  Quit smoking 1 month ago (February 2023)  ?Vaping Use  ? Vaping Use: Never used  ?Substance and Sexual Activity  ? Alcohol use: No  ? Drug use: No  ? Sexual activity: Not on file  ?Other Topics Concern  ? Not on file  ?Social History Narrative  ? Not on file  ? ?Social Determinants of Health  ? ?Financial Resource Strain: Low Risk   ? Difficulty of Paying Living Expenses: Not hard at all  ?Food Insecurity: No Food Insecurity  ? Worried About Charity fundraiser in the Last Year: Never true  ? Ran Out of Food in the Last Year: Never true  ?Transportation Needs: No Transportation Needs  ? Lack of Transportation (Medical): No  ? Lack of Transportation (Non-Medical): No   ?Physical Activity: Inactive  ? Days of Exercise per Week: 0 days  ? Minutes of Exercise per Session: 0 min  ?Stress: No Stress Concern Present  ? Feeling of Stress : Only a little  ?Social Connections: Moderately Isolated  ? Frequency of Communication with Friends and Family: More than three times a week  ? Frequency of Social Gatherings with Friends and Family: More than three times a week  ? Attends Religious Services: 1 to 4 times per year  ? Active Member of Clubs or Organizations: No  ? Attends Archivist Meetings: Never  ? Marital Status: Never married  ?  ? ?Family History: ?The patient's family history includes Diabetes in his brother, father, and mother. There is no history of Colon cancer, Esophageal cancer, Pancreatic cancer, Stomach cancer, Liver disease, or CAD. ? ?ROS:   ?Please see the history of present illness.    ?ROS  ?All other systems reviewed and negative.  ? ?EKGs/Labs/Other Studies Reviewed:   ? ?The following studies were reviewed today: ?2D echo, EKG ? ?EKG:  EKG is ordered today and demonstrates *** ? ?Recent Labs: ?02/27/2021: B Natriuretic Peptide >4,500.0; TSH 2.455 ?02/28/2021: Magnesium 1.9 ?03/05/2021: Hemoglobin 8.8; Platelets 241 ?05/21/2021: ALT 18; BUN 44; Creatinine, Ser 2.48; Potassium 3.5; Sodium 139  ? ?Recent Lipid Panel ?   ?Component Value Date/Time  ? CHOL 82 (L) 05/21/2021 1057  ? TRIG 67 05/21/2021 1057  ? HDL 27 (L) 05/21/2021 1057  ? CHOLHDL 3.0 05/21/2021 1057  ? [place] 40 05/21/2021 1057  ? LDLDIRECT 36 05/21/2021 1057  ? ? ?Physical Exam:   ? ?VS:  There were no vitals taken for this visit.   ? ?Wt Readings from Last 3 Encounters:  ?04/28/21 180 lb (81.6 kg)  ?04/23/21 180 lb (81.6 kg)  ?04/07/21 180 lb (81.6 kg)  ?  ? ?GEN: Well nourished, well developed in no acute distress ?HEENT: Normal ?NECK: No JVD; No carotid bruits ?LYMPHATICS: No lymphadenopathy ?CARDIAC:RRR, no murmurs, rubs, gallops ?RESPIRATORY:  Clear to auscultation without rales, wheezing or  rhonchi  ?ABDOMEN: Soft, non-tender, non-distended ?MUSCULOSKELETAL:  No edema; No deformity  ?SKIN: Warm and dry ?NEUROLOGIC:  Alert and oriented x 3 ?PSYCHIATRIC:  Normal affect   ?ASSESSMENT:   ? ?1. Chronic combined systolic (congestive) and diastolic (congestive) heart failure (HCC)   ?2. Primary hypertension   ?3. CKD (chronic kidney disease) stage 4, GFR 15-29 ml/min (HCC)   ?4. Tachycardia   ? ?PLAN:   ? ?In order of problems listed above: ? ? Chronic combined systolic/diastolic CHF ?-recent hospitalization for acute CHF exacerbation  in the setting of AKI with progressive diabetic nephropathy and secondary nephrotic syndrome leading to anasarca and required albumin infusions to assist with diuresis and exacerbation from hypertensive urgency ?-he is followed by nephrology who manages his diuretics ?-He does not appear volume overloaded on exam today ?-Continue carvedilol 25 mg twice daily, hydralazine 100 mg 3 times daily, Imdur 60 mg daily, and Lasix 80 mg daily with as needed refills ?-no spiro/ARB/ARNI due to CKD 4 ?-no SGLT2i due to IDDM ?-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  EF was 43%. ? ?2.  HTN ?-BP is controlled on exam today. ?-BP goal < 130/75mmHg ?-continue prescription drug management with amlodipine 10 mg daily, hydralazine 100 mg 3 times daily, Imdur 60 mg daily, carvedilol 25 mg twice daily with as needed refills.  ?-will get  home sleep study to rule out OSA given morbid obesity ? ?3.  CKD stage 4 ?-followed by nephrology ?-diuretics management by renal ? ?4.  Sinus tachycardia ?-Rate controlled on beta-blocker ?-Patch 04/2021 showed an average heart rate of 104 bpm. ? ?Time Spent: ?25 minutes total time of encounter, including 15 minutes spent in face-to-face patient care on the date of this encounter. This time includes coordination of  care and counseling regarding above mentioned problem list. Remainder of non-face-to-face time involved reviewing chart documents/testing relevant to the patient encounter and documentation in the medical r

## 2021-10-02 ENCOUNTER — Ambulatory Visit: Payer: Medicaid Other | Admitting: Cardiology

## 2021-10-02 DIAGNOSIS — I10 Essential (primary) hypertension: Secondary | ICD-10-CM

## 2021-10-02 DIAGNOSIS — R000 Tachycardia, unspecified: Secondary | ICD-10-CM

## 2021-10-02 DIAGNOSIS — I5042 Chronic combined systolic (congestive) and diastolic (congestive) heart failure: Secondary | ICD-10-CM

## 2021-10-02 DIAGNOSIS — N184 Chronic kidney disease, stage 4 (severe): Secondary | ICD-10-CM

## 2021-10-03 DIAGNOSIS — Z419 Encounter for procedure for purposes other than remedying health state, unspecified: Secondary | ICD-10-CM | POA: Diagnosis not present

## 2021-10-06 ENCOUNTER — Other Ambulatory Visit: Payer: Self-pay | Admitting: Family Medicine

## 2021-10-06 DIAGNOSIS — G4709 Other insomnia: Secondary | ICD-10-CM

## 2021-10-09 ENCOUNTER — Encounter (HOSPITAL_BASED_OUTPATIENT_CLINIC_OR_DEPARTMENT_OTHER): Payer: Medicaid Other | Attending: Internal Medicine | Admitting: Internal Medicine

## 2021-10-15 ENCOUNTER — Encounter (HOSPITAL_BASED_OUTPATIENT_CLINIC_OR_DEPARTMENT_OTHER): Payer: Medicaid Other | Attending: Internal Medicine | Admitting: Internal Medicine

## 2021-10-16 ENCOUNTER — Encounter (HOSPITAL_BASED_OUTPATIENT_CLINIC_OR_DEPARTMENT_OTHER): Payer: Medicaid Other | Admitting: Internal Medicine

## 2021-10-28 ENCOUNTER — Other Ambulatory Visit: Payer: Self-pay

## 2021-10-28 NOTE — Patient Instructions (Signed)
Visit Information ? ?Mr. Daffin was given information about Medicaid Managed Care team care coordination services as a part of their [facility] Medicaid benefit. DEREKE NEUMANN verbally consented to engagement with the [facility] Managed Care team.  ? ?If you are experiencing a medical emergency, please call 911 or report to your local emergency department or urgent care.  ? ?If you have a non-emergency medical problem during routine business hours, please contact your provider's office and ask to speak with a nurse.  ? ?For questions related to your [facility] health plan, please call: [phone] or go here:[email or web address] ? ?If you would like to schedule transportation through your [facility] plan, please call the following number at least 2 days in advance of your appointment: [phone]. ? You can also use the MTM portal or MTM mobile app to manage your rides. For the portal, please go to [email or web address]. ? ?Call the [facility] at [phone], at any time, 24 hours a day, 7 days a week. If you are in danger or need immediate medical attention call 911. ? ?If you would like help to quit smoking, call 1-800-QUIT-NOW (419) 257-6422) OR Espa?ol: 1-855-D?jelo-Ya (431)020-9904) o para m?s informaci?n haga clic aqu? or Text READY to 200-400 to register via text ? ?Mr. Hadden - following are the goals we discussed in your visit today:  Please see Patient Goals in the [place] of Care below. ? ?Please see education materials related to today's visit provided as print materials.  ? ?The patient verbalized understanding of instructions, educational materials, and care plan provided today and agreed to receive a mailed copy of patient instructions, educational materials, and care plan.  ? ?The Managed Medicaid care management team will reach out to the patient again over the next 30 days.  ? ?Salvatore Marvel RN, [name] ?Community Care Coordinator ?[facility] ?Mobile: [phone]  ? ?Following is a copy of your plan of care:  ?Care Plan : [place] of Care  ?Updates made by Inge Rise, RN since 10/28/2021 12:00 AM  ?  ? ?Problem: Chronic Disease Management and Care Coordination Needs for CHF, HTN, DM   ?Priority: High  ?  ? 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 ? ?RNCM Clinical Goal(s):  ?Patient will verbalize understanding of plan for management of CHF, HTN, and DMII as evidenced by improvement in management of these chronic diseases. ?verbalize basic understanding of CHF, HTN, and DMII disease process and self health management plan as evidenced by improved control of factors that cause CHF, improved blood pressure readings < 140/90 and improved control of blood sugar readings with range with 80 - 120. ?take all medications exactly as prescribed and will call provider for medication related questions as evidenced by compliance with medications3/31/    ?attend all scheduled medical appointments: No future appointments scheduled as evidenced by attending all scheduled appointments        ?demonstrate improved adherence to prescribed treatment plan for CHF, HTN, and DMII as evidenced by  better control of blood pressure, blood sugars and factors impacting CHF. ?continue to work with Consulting civil engineer and/or Social Worker to address care management and care coordination needs related to CHF, HTN, and DMII as evidenced by adherence to CM Team  Scheduled appointments     through collaboration with RN Care manager, provider, and care team.  ? ?Interventions: ?Inter-disciplinary care team collaboration (see longitudinal plan of care) ?Evaluation of current treatment plan related to  self management and patient's adherence to plan as established by provider ? ? ?Heart Failure Interventions:  (Status: Goal on Track (progressing): YES.)  Long Term Goal  ?Wt Readings from Last 3 Encounters:  ?04/28/21 180 lb (81.6 kg)  ?04/23/21 180 lb (81.6 kg)  ?04/07/21 180 lb (81.6 kg)  ? ?Basic overview and discussion of pathophysiology of Heart Failure reviewed ?Provided education on low sodium diet ?Assessed need for readable accurate scales in home ?Discussed the importance of keeping all appointments with provider ?Provided patient with education about the role of exercise in the management of heart failure ?Patient reports he has difficulty weighing himself at home due to vision issues and Left BKA.   ?Patient denies any shortness of breath or swelling of right lower extremity and left BKA stump at this time.  ? ?Diabetes:  (Status: Goal on Track (progressing): YES.) Long Term Goal  ?Lab Results  ?Component Value Date  ? HGBA1C 7.2 (H) 02/27/2021  ?  ?Assessed patient's understanding of A1c goal: <7% ?Provided education to patient about basic DM disease process; ?Reviewed medications with patient and discussed importance of medication adherence;        ?Counseled on importance of regular laboratory monitoring as prescribed;        ?Discussed plans with patient for ongoing care management follow up and provided patient with direct contact information for care management team;      ?Reviewed scheduled/upcoming provider appointments including: No future appointments scheduled at this time;         ?Advised patient, providing education and rationale, to check cbg once daily and record        ?call provider for findings outside established parameters;       ?Patient's Right  lateral foot wound healing well. Patient no longer receiving treatment at the [facility] 1 time/week.  Patient describes wound as a small dot currently. ?Patient checking blood sugar once a day in the evening.  Reports blood sugars range between 150 - 180.  Patient denies any signs or symptoms of hypo or hyperglycemia. ?Patient has left eye blindness and has right eye blurred/low vision.  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 He is a patient of Dr. Iona Hansen at [facility]. Patient had office visit with Dr. Iona Hansen last month (March 2023) and has 6 month follow up appointment scheduled.    ? ?Hypertension: (Status: New goal.) Long Term Goal  ?Last practice recorded BP readings:  ?BP Readings from Last 3 Encounters:  ?08/18/21 (!) 160/89  ?05/21/21 140/80  ?05/05/21 129/79  ?Most recent eGFR/CrCl:  ?Lab Results  ?Component Value Date  ? EGFR 31 (L) 05/21/2021  ?  No components found for: CRCL ? ?Evaluation of current treatment plan related to hypertension self management and patient's adherence to plan as established by provider;   ?Reviewed prescribed diet Low Sodium ?Reviewed medications with patient and discussed importance of compliance;  ?Counseled on the importance of exercise goals  with target of 150 minutes per week ?Discussed plans with patient for ongoing care management follow up and provided patient with direct contact information for care management team; ?Advised patient, providing education and rationale, to monitor blood pressure daily and record, calling PCP for findings outside established parameters;  ?Reviewed scheduled/upcoming provider appointments including:  ?Patient reports recent blood pressure readings at home have been elevated up to 170/110 but does not stay elevated.  We discussed continued monitoring of blood pressures and monitoring salt intake.  Patient states he does not use any added  salt but is unable to read food labels due to his poor vision. ? ?Patient Goals/Self-Care Activities: ?Take medications as prescribed   ?Attend all scheduled provider appointments ?Call pharmacy for [facility]

## 2021-10-28 NOTE — Patient Outreach (Signed)
?Medicaid Managed Care   ?Nurse Care Manager Note ? ?10/28/2021 ?Name:  Todd Mccoy MRN:  [id number] DOB:  06-02-1971 ? ?Todd Mccoy is an 51 y.o. year old male who is a primary patient of [name], MD.  The [facility] Managed Care Coordination team was consulted for assistance with:    ?CHF ?HTN ?DMII ? ?Mr. Bookwalter was given information about Medicaid Managed Care Coordination team services today. [name] Patient agreed to services and verbal consent obtained. ? ?Engaged with patient by telephone for follow up visit in response to provider referral for case management and/or care coordination services.  ? ?Assessments/Interventions:  Review of past medical history, allergies, medications, health status, including review of consultants reports, laboratory and other test data, was performed as part of comprehensive evaluation and provision of chronic care management services. ? ?SDOH (Social Determinants of Health) assessments and interventions performed: ? ? ?Care Plan ? ?No Known Allergies ? ?Medications Reviewed Today   ? ? Reviewed by Inge Rise, RN (Case Manager) on 10/28/21 at [phone]  Med List Status: <None>  ? ?Medication Order Taking? Sig Documenting Provider Last Dose Status Informant  ?acetaminophen (TYLENOL) 325 MG tablet [id number] No Take 650 mg by mouth every 6 (six) hours as needed for mild pain. [provider] Taking Active Self  ?amLODipine (NORVASC) 10 MG tablet [id number] No Take 1 tablet (10 mg total) by mouth daily. [name], MD Taking Active   ?atorvastatin (LIPITOR) 20 MG tablet [id number] No Take 1 tablet (20 mg total) by mouth daily. [name], MD Taking Active   ?carvedilol (COREG) 25 MG tablet [id number] No Take 1 tablet (25 mg total) by mouth 2 (two) times daily. [name], MD Taking Active   ?doxycycline (VIBRAMYCIN) 100 MG capsule [id number] No Take 1 capsule (100 mg total) by mouth 2 (two) times daily.  ?Patient not  taking: Reported on 09/30/2021  ? Rodriguez-Southworth, Sunday Spillers, PA-C Not Taking Active   ?         ?Med Note [facility], Kemo Spruce A   Wed Sep 30, 2021  9:17 AM) Completed  ?ferrous sulfate 325 (65 FE) MG tablet [id number] No Take 1 tablet (325 mg total) by mouth 3 (three) times daily with meals.  ?Patient taking differently: Take 325 mg by mouth in the morning and at bedtime.  ? Deatra James, MD Taking Expired 04/11/21 2359   ?furosemide (LASIX) 80 MG tablet [id number] No Take 1 tablet (80 mg total) by mouth daily. [name], MD Taking Active   ?hydrALAZINE (APRESOLINE) 100 MG tablet [id number] No Take 1 tablet (100 mg total) by mouth 3 (three) times daily. Kathie Dike, MD Taking Active   ?hydrOXYzine (ATARAX) 25 MG tablet [id number]  TAKE 1 TABLET BY MOUTH AT BEDTIME AS NEEDED [name], MD  Active   ?  Discontinued 05/13/20 1120   ?insulin glargine (LANTUS) 100 UNIT/ML Solostar Pen [id number] No Inject 10 Units into the skin at bedtime. Kathie Dike, MD Taking Active   ?Insulin Pen Needle 32G X 4 MM MISC [id number] No USE TO INJECT INSULIN DAILY. Deatra James, MD Taking Active Self  ?isosorbide mononitrate (IMDUR) 60 MG 24 hr tablet [id number] No Take 1 tablet (60 mg total) by mouth daily. [name], MD Taking Active   ?Discontinued 12/22/20 1205 (Stop Taking at Discharge)   ?Discontinued 12/22/20 1205 (Stop Taking at Discharge)   ?Naphazoline HCl (CLEAR EYES OP) [id number] No Place 1 drop into both eyes  as needed (irritation). [provider] Taking Active Self  ?oxyCODONE (OXY IR/ROXICODONE) 5 MG immediate release tablet [id number] No Take 1 tablet (5 mg total) by mouth every 12 (twelve) hours as needed for moderate pain (pain score 4-6). Suzan Slick, NP Taking Active   ?polyethylene glycol powder (GLYCOLAX/MIRALAX) 17 GM/SCOOP powder [id number] No Take 17 g by mouth 2 (two) times daily as needed.  ?Patient not taking: Reported on 09/30/2021  ? [name], MD Not Taking  Active Self  ?pregabalin (LYRICA) 50 MG capsule [id number] No Take 1 capsule (50 mg total) by mouth 2 (two) times daily. Suzan Slick, NP Taking Active   ? ?  ?  ? ?  ? ? ?Patient Active Problem List  ? Diagnosis Date Noted  ? Chronic combined systolic (congestive) and diastolic (congestive) heart failure ([place]) 04/07/2021  ? Chronic renal disease, stage 4, severely decreased glomerular filtration rate between 15-29 mL/min/1.73 square meter (HCC)   ? Hypertensive urgency   ? AKI (acute kidney injury) ([place])   ? Acute CHF (congestive heart failure) ([place]) 02/27/2021  ? Normocytic anemia 02/27/2021  ? Anasarca 02/27/2021  ? Abscess of left foot 12/10/2020  ? S/P BKA (below knee amputation) unilateral, left ([place]) 12/10/2020  ? Dyslipidemia 12/10/2020  ? CKD (chronic kidney disease), stage III ([place]) 12/10/2020  ? Subacute osteomyelitis, left ankle and foot ([place])   ? Osteomyelitis of fifth toe of left foot ([place])   ? Severe protein-calorie malnutrition ([place])   ? HTN (hypertension) 11/12/2020  ? Osteomyelitis of second toe of right foot ([place])   ? Cellulitis and abscess of foot   ? Toe osteomyelitis ([place]) 03/24/2020  ? Necrotizing fasciitis ([place]) 09/08/2014  ? Diabetes mellitus with hyperglycemia ([place]) 09/07/2014  ? Tobacco abuse 09/07/2014  ? Abscess of back   ? Abscess of lower back 09/06/2014  ? DM2 (diabetes mellitus, type 2) ([place]) 09/06/2014  ? Sepsis ([place]) 09/06/2014  ? Scrotal abscess 06/20/2014  ? ? ?Conditions to be addressed/monitored per PCP order:  CHF, HTN, and DMII ? ?Care Plan : RN Care Manager Plan of Care  ?Updates made by Inge Rise, RN since 10/28/2021 12:00 AM  ?  ? ?Problem: Chronic Disease Management and Care Coordination Needs for CHF, HTN, DM   ?Priority: High  ?  ? 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 ? ?RNCM Clinical Goal(s):  ?Patient will verbalize understanding of plan for management of CHF, HTN, and DMII as evidenced by improvement in management of these chronic diseases. ?verbalize basic understanding of CHF, HTN, and DMII disease process and self health management plan as evidenced by improved control of factors that cause CHF, improved blood pressure readings < 140/90 and improved control of blood sugar readings with range with 80 - 120. ?take all medications exactly as prescribed and will call provider for medication related questions as evidenced by compliance with medications3/31/    ?attend all scheduled medical appointments: No future appointments scheduled as evidenced by attending all scheduled appointments        ?  demonstrate improved adherence to prescribed treatment plan for CHF, HTN, and DMII as evidenced by better control of blood pressure, blood sugars and factors impacting CHF. ?continue to work with Consulting civil engineer and/or Social Worker to address care management and care coordination needs related to CHF, HTN, and DMII as evidenced by adherence to CM Team Scheduled appointments     through collaboration with RN Care manager, provider, and care team.  ? ?Interventions: ?Inter-disciplinary care team collaboration (see longitudinal plan of care) ?Evaluation of current treatment plan related to  self management and patient's adherence to plan as established by provider ? ? ?Heart Failure Interventions:  (Status: Goal on Track (progressing): YES.)  Long Term Goal  ?Wt Readings from Last 3 Encounters:  ?04/28/21 180 lb (81.6 kg)  ?04/23/21 180 lb (81.6 kg)  ?04/07/21 180 lb (81.6 kg)  ? ?Basic overview  and discussion of pathophysiology of Heart Failure reviewed ?Provided education on low sodium diet ?Assessed need for readable accurate scales in home ?Discussed the importance of keeping all appointments with provi

## 2021-10-29 ENCOUNTER — Telehealth: Payer: Self-pay | Admitting: Orthopedic Surgery

## 2021-10-29 NOTE — Telephone Encounter (Signed)
Todd Rise, RN called about pt and states he is a L BKA and is experiencing phantom pain in that leg. She was wondering if we could prescribe something stronger than tylenol for the pain for this pt.  ? ?[id number] ?

## 2021-10-30 NOTE — Telephone Encounter (Signed)
I called and sw Nurse. The pt has not bee in the office since 06/2021 at that time you gave rx for Lyrica 50 mg bid and he is still taking this. States that the is not sleeping at night due to neurpathic pain and wanted something else to take. He has never been on Neurontin before abd he is not allergic to any medications.. pharmacy confirmed on file  if you are agreeable to send in rx.  ?

## 2021-11-02 ENCOUNTER — Other Ambulatory Visit: Payer: Self-pay | Admitting: Orthopedic Surgery

## 2021-11-02 DIAGNOSIS — Z419 Encounter for procedure for purposes other than remedying health state, unspecified: Secondary | ICD-10-CM | POA: Diagnosis not present

## 2021-11-02 DIAGNOSIS — Z89512 Acquired absence of left leg below knee: Secondary | ICD-10-CM

## 2021-11-02 NOTE — Progress Notes (Signed)
PT referral placed at [facility] ?

## 2021-11-03 ENCOUNTER — Telehealth: Payer: Self-pay | Admitting: Family

## 2021-11-03 MED ORDER — GABAPENTIN 300 MG PO CAPS
300.0000 mg | ORAL_CAPSULE | Freq: Three times a day (TID) | ORAL | 0 refills | Status: DC
Start: 2021-11-03 — End: 2022-01-23

## 2021-11-03 NOTE — Telephone Encounter (Signed)
That would be great.

## 2021-11-03 NOTE — Telephone Encounter (Signed)
Thanks. ? ?Did send gabapentin. Can we get him to come in in 4 weeks or so to see how going

## 2021-11-03 NOTE — Addendum Note (Signed)
Addended by: Dondra Prader R on: 11/03/2021 11:22 AM ? ? Modules accepted: Orders ? ?

## 2021-11-03 NOTE — Telephone Encounter (Signed)
Called to sch pt follow up with Erin per AF message. Pt needs to follow up the week of May 29th for a 4 week follow up appt. ?

## 2021-11-03 NOTE — Telephone Encounter (Signed)
Sch this pt for 4 weeks from today/around today correct? Just want to clarify before I call to sch :) ?

## 2021-11-25 ENCOUNTER — Other Ambulatory Visit: Payer: Self-pay

## 2021-11-25 NOTE — Patient Outreach (Signed)
Medicaid Managed Care   Nurse Care Manager Note  11/25/2021 Name:  Todd Mccoy MRN:  [id number] DOB:  1970-11-24  Todd Mccoy is an 51 y.o. year old male who is Mccoy primary patient of Todd Rakes, MD.  The [facility] Managed Care Coordination team was consulted for assistance with:    CHF HTN DMII  [name] was given information about Medicaid Managed Care Coordination team services today. Todd Mccoy Patient agreed to services and verbal consent obtained.  Engaged with patient by telephone for follow up visit in response to provider referral for case management and/or care coordination services.   Assessments/Interventions:  Review of past medical history, allergies, medications, health status, including review of consultants reports, laboratory and other test data, was performed as part of comprehensive evaluation and provision of chronic care management services.  SDOH (Social Determinants of Health) assessments and interventions performed:   Care Plan  No Known Allergies  Medications Reviewed Today     Reviewed by Todd Rise, RN (Case Manager) on 11/25/21 at [place] List Status: <None>   Medication Order Taking? Sig Documenting Provider Last Dose Status Informant  acetaminophen (TYLENOL) 325 MG tablet [id number] No Take 650 mg by mouth every 6 (six) hours as needed for mild pain. [provider] Taking Active Self  amLODipine (NORVASC) 10 MG tablet [id number] No Take 1 tablet (10 mg total) by mouth daily. Todd Margarita, MD Taking Active   atorvastatin (LIPITOR) 20 MG tablet [id number] No Take 1 tablet (20 mg total) by mouth daily. Todd Margarita, MD Taking Active   carvedilol (COREG) 25 MG tablet [id number] No Take 1 tablet (25 mg total) by mouth 2 (two) times daily. Todd Margarita, MD Taking Active   doxycycline (VIBRAMYCIN) 100 MG capsule [id number] No Take 1 capsule (100 mg total) by mouth 2 (two) times daily.  Patient not  taking: Reported on 09/30/2021   Todd Mattocks, PA-C Not Taking Active            Med Note [facility], Todd Mccoy   Wed Sep 30, 2021  9:17 AM) Completed  ferrous sulfate 325 (65 FE) MG tablet [id number] No Take 1 tablet (325 mg total) by mouth 3 (three) times daily with meals.  Patient taking differently: Take 325 mg by mouth in the morning and at bedtime.   Todd James, MD Taking Expired 04/11/21 2359   furosemide (LASIX) 80 MG tablet [id number] No Take 1 tablet (80 mg total) by mouth daily. Todd Margarita, MD Taking Active   gabapentin (NEURONTIN) 300 MG capsule [id number]  Take 1 capsule (300 mg total) by mouth 3 (three) times daily. Todd Slick, NP  Active   hydrALAZINE (APRESOLINE) 100 MG tablet [id number] No Take 1 tablet (100 mg total) by mouth 3 (three) times daily. Todd Dike, MD Taking Active   hydrOXYzine (ATARAX) 25 MG tablet [id number]  TAKE 1 TABLET BY MOUTH AT BEDTIME AS NEEDED Todd Rakes, MD  Active     Discontinued 05/13/20 1120   insulin glargine (LANTUS) 100 UNIT/ML Solostar Pen [id number] No Inject 10 Units into the skin at bedtime. Todd Dike, MD Taking Active   Insulin Pen Needle 32G X 4 MM MISC [id number] No USE TO INJECT INSULIN DAILY. Todd James, MD Taking Active Self  isosorbide mononitrate (IMDUR) 60 MG 24 hr tablet [id number] No Take 1 tablet (60 mg total) by mouth daily. Todd Margarita, MD Taking Active   Discontinued 12/22/20 1205 (Stop  Taking at Discharge)   Discontinued 12/22/20 1205 (Stop Taking at Discharge)   Naphazoline HCl (CLEAR EYES OP) [id number] No Place 1 drop into both eyes as needed (irritation). [provider] Taking Active Self  polyethylene glycol powder (GLYCOLAX/MIRALAX) 17 GM/SCOOP powder [id number] No Take 17 g by mouth 2 (two) times daily as needed.  Patient not taking: Reported on 09/30/2021   Todd Rakes, MD Not Taking Active Self            Patient Active Problem List   Diagnosis  Date Noted   Chronic combined systolic (congestive) and diastolic (congestive) heart failure ([place]) 04/07/2021   Chronic renal disease, stage 4, severely decreased glomerular filtration rate between 15-29 mL/min/1.73 square meter (HCC)    Hypertensive urgency    AKI (acute kidney injury) ([place])    Acute CHF (congestive heart failure) ([place]) 02/27/2021   Normocytic anemia 02/27/2021   Anasarca 02/27/2021   Abscess of left foot 12/10/2020   S/P BKA (below knee amputation) unilateral, left ([place]) 12/10/2020   Dyslipidemia 12/10/2020   CKD (chronic kidney disease), stage III ([place]) 12/10/2020   Subacute osteomyelitis, left ankle and foot (HCC)    Osteomyelitis of fifth toe of left foot (HCC)    Severe protein-calorie malnutrition ([place])    HTN (hypertension) 11/12/2020   Osteomyelitis of second toe of right foot ([place])    Cellulitis and abscess of foot    Toe osteomyelitis ([place]) 03/24/2020   Necrotizing fasciitis ([place]) 09/08/2014   Diabetes mellitus with hyperglycemia ([place]) 09/07/2014   Tobacco abuse 09/07/2014   Abscess of back    Abscess of lower back 09/06/2014   DM2 (diabetes mellitus, type 2) ([place]) 09/06/2014   Sepsis ([place]) 09/06/2014   Scrotal abscess 06/20/2014    Conditions to be addressed/monitored per PCP order:  CHF, HTN, and DMII  Care Plan : RN Care Manager Plan of Care  Updates made by Todd Rise, RN since 11/25/2021 12:00 AM     Problem: Chronic Disease Management and Care Coordination Needs for CHF, HTN, DM   Priority: High     Long-Range Goal: Development of Plan of Care for Chronic Disease Management and Care Coordination Needs (CHF, HTN, DM)   Start Date: 09/30/2021  Expected End Date: 01/28/2022  Priority: High  Note:   Current Barriers:  Knowledge Deficits related to plan of care for management of CHF, HTN, and DMII  Chronic Disease Management support and education needs related to CHF, HTN, and DMII Difficulty obtaining medications Recent Vision  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  RNCM Clinical Goal(s):  Patient will verbalize understanding of plan for management of CHF, HTN, and DMII as evidenced by improvement in management of these chronic diseases. verbalize basic understanding of CHF, HTN, and DMII disease process and self health management plan as evidenced by improved control of factors that cause CHF, improved blood pressure readings < 140/90 and improved control of blood sugar readings with range with 80 - 120. take all medications exactly as prescribed and will call provider for medication related questions as evidenced by compliance with medications3/31/    attend all scheduled medical appointments: No future appointments scheduled as evidenced by attending all scheduled appointments        demonstrate improved adherence to prescribed treatment plan for CHF, HTN, and DMII as evidenced by better control of blood pressure, blood sugars and factors impacting CHF. continue to work with Consulting civil engineer and/or Education officer, museum to  address care management and care coordination needs related to CHF, HTN, and DMII as evidenced by adherence to CM Team Scheduled appointments     through collaboration with RN Care manager, provider, and care team.   Interventions: Inter-disciplinary care team collaboration (see longitudinal plan of care) Evaluation of current treatment plan related to  self management and patient's adherence to plan as established by provider   Heart Failure Interventions:  (Status: Goal on Track (progressing): YES.)  Long Term Goal  Wt Readings from Last 3 Encounters:  04/28/21 180 lb (81.6 kg)  04/23/21 180 lb (81.6 kg)  04/07/21 180 lb (81.6 kg)    Basic overview and discussion of pathophysiology of Heart Failure reviewed Provided education on low sodium diet Assessed need for readable accurate scales in home Discussed the importance of keeping all appointments with  provider Provided patient with education about the role of exercise in the management of heart failure Patient reports he has difficulty weighing himself at home due to vision issues and Left BKA.  He does not weigh himself on Mccoy regular basis. Patient denies any shortness of breath, chest pain or swelling of right lower extremity and left BKA stump at this time.   Diabetes:  (Status: Goal on Track (progressing): YES.) Long Term Goal  Lab Results  Component Value Date   HGBA1C 7.2 (H) 02/27/2021    Assessed patient's understanding of A1c goal: <7% Provided education to patient about basic DM disease process; Reviewed medications with patient and discussed importance of medication adherence;        Discussed plans with patient for ongoing care management follow up and provided patient with direct contact information for care management team;      Reviewed scheduled/upcoming provider appointments including: No future appointments scheduled at this time;         Advised patient, providing education and rationale, to check cbg once daily and record        call provider for findings outside established parameters;       Referral made to social work team for assistance with [place] in the community;      Patient's Right lateral foot wound completely healed. This RN Care Manager provided education on the importance of inspecting right foot for any breaks in the skin or redness.  Patient reports he has Mccoy person who looks at his right foot on Mccoy regular basis to inspect it. Patient checking blood sugar once Mccoy day in the evening.  Reports blood sugars range between 180 - 200 lately.  Patient denies any signs or symptoms of hypo or hyperglycemia. Patient has left eye blindness and has right eye blurred/low vision.  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 He is Mccoy patient of Dr. Iona Hansen at [facility].  Patient had office visit with Dr. Iona Hansen last month (March 2023) and has 6 month follow up appointment scheduled.   Referral made to [place] for [place] in the community.  Hypertension: (Status: Goal on Track (progressing): YES.) Long Term Goal  Last practice recorded BP readings:  BP Readings from Last 3 Encounters:  08/18/21 (!) 160/89  05/21/21 140/80  05/05/21 129/79  Most recent eGFR/CrCl:  Lab Results  Component Value Date   EGFR 31 (L) 05/21/2021    No components found for: CRCL  Evaluation of current treatment plan related to hypertension self management and patient's adherence to  plan as established by provider;   Reviewed prescribed diet Low Sodium Reviewed medications with patient and discussed importance of compliance;  Counseled on the importance of exercise goals with target of 150 minutes per week Discussed plans with patient for ongoing care management follow up and provided patient with direct contact information for care management team; Advised patient, providing education and rationale, to monitor blood pressure daily and record, calling PCP for findings outside established parameters;  Reviewed scheduled/upcoming provider appointments including:  Patient reports recent blood pressure readings at home have been varying but does not stay elevated.  We discussed continued monitoring of blood pressures and monitoring salt intake.  Patient does not use any added salt but is unable to read food labels due to his poor vision.  This RN Transport planner provided education on food products that are considered high in sodium: lunch meats, soups, canned vegetables, frozen dinners, processed foods versus the better choice of fresh or frozen vegetables.  Patient verbalized understanding.  Patient Goals/Self-Care Activities: Take medications as prescribed   Attend all scheduled provider appointments Call pharmacy for medication refills 3-7 days in advance of running out of  medications Call provider office for new concerns or questions  check blood sugar at prescribed times: once daily check blood pressure daily limit salt intake to 2000 mg/day       Follow Up:  Patient agrees to Care Plan and Follow-up.  Plan: The Managed Medicaid care management team will reach out to the patient again over the next 40 days.  Date/time of next scheduled RN care management/care coordination outreach:  December 30, 2021 at 9:00 AM  [place], [place] [facility] Mobile: [phone]

## 2021-11-25 NOTE — Patient Instructions (Signed)
Visit Information  Todd Mccoy was given information about Medicaid Managed Care team care coordination services as a part of their [facility] Medicaid benefit. Todd Mccoy verbally consented to engagement with the [facility] Managed Care team.   If you are experiencing a medical emergency, please call 911 or report to your local emergency department or urgent care.   If you have a non-emergency medical problem during routine business hours, please contact your provider's office and ask to speak with a nurse.   For questions related to your [facility] health plan, please call: [phone] or go here:[email or web address]  If you would like to schedule transportation through your [facility] plan, please call the following number at least 2 days in advance of your appointment: [phone].  You can also use the MTM portal or MTM mobile app to manage your rides. For the portal, please go to [email or web address].  Call the [place] at [phone], at any time, 24 hours a day, 7 days a week. If you are in danger or need immediate medical attention call 911.  If you would like help to quit smoking, call 1-800-QUIT-NOW (918)215-0688) OR Espaol: 1-855-Djelo-Ya (7-902-409-7353) o para ms informacin haga clic aqu or Text READY to 200-400 to register via text  Todd Mccoy - following are the goals we discussed in your visit today: Please see Patient Goals in the [place] of Care below.  Please see education materials related to today's visit provided as print materials.   The patient verbalized understanding of instructions, educational materials, and care plan provided today and agreed to receive a mailed copy of patient instructions, educational materials, and care plan.   The Managed Medicaid care management team will reach out to the patient again over the next 40 days.   Salvatore Marvel RN, [name] Community Care Coordinator [place] [facility] Mobile: [phone]   Following is a copy of your plan of care:  Care Plan : [facility] of Care  Updates made by Inge Rise, RN since 11/25/2021 12:00 AM     Problem: Chronic Disease Management and Care Coordination Needs for CHF, HTN, DM   Priority: High     Long-Range Goal: Development of Plan of Care for Chronic Disease Management and Care Coordination Needs (CHF, HTN, DM)   Start Date: 09/30/2021  Expected End Date: 01/28/2022  Priority: High  Note:   Current Barriers:  Knowledge Deficits related to plan of care for management of CHF, HTN, and DMII  Chronic Disease Management support and education needs related to CHF, HTN, and DMII Difficulty obtaining medications Recent Vision 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  RNCM Clinical Goal(s):  Patient will verbalize understanding of plan for management of CHF, HTN, and DMII as evidenced by improvement in management of these chronic diseases. verbalize basic understanding of CHF, HTN, and DMII disease process and self health management plan as evidenced by improved control of factors that cause CHF, improved blood pressure readings < 140/90 and improved control of blood sugar readings with range with 80 - 120. take all medications exactly as prescribed and will call provider for medication related questions as evidenced by compliance with medications3/31/    attend all scheduled medical appointments: No future appointments scheduled as evidenced by attending all scheduled appointments        demonstrate improved adherence to prescribed treatment plan for CHF, HTN, and DMII as evidenced by better  control of blood pressure, blood sugars and factors impacting CHF. continue to work with Consulting civil engineer and/or Social Worker to address care management and care coordination needs related to CHF, HTN, and DMII as evidenced by adherence to CM Team  Scheduled appointments     through collaboration with Consulting civil engineer, provider, and care team.   Interventions: Inter-disciplinary care team collaboration (see longitudinal plan of care) Evaluation of current treatment plan related to  self management and patient's adherence to plan as established by provider   Heart Failure Interventions:  (Status: Goal on Track (progressing): YES.)  Long Term Goal  Wt Readings from Last 3 Encounters:  04/28/21 180 lb (81.6 kg)  04/23/21 180 lb (81.6 kg)  04/07/21 180 lb (81.6 kg)    Basic overview and discussion of pathophysiology of Heart Failure reviewed Provided education on low sodium diet Assessed need for readable accurate scales in home Discussed the importance of keeping all appointments with provider Provided patient with education about the role of exercise in the management of heart failure Patient reports he has difficulty weighing himself at home due to vision issues and Left BKA.  He does not weigh himself on a regular basis. Patient denies any shortness of breath, chest pain or swelling of right lower extremity and left BKA stump at this time.   Diabetes:  (Status: Goal on Track (progressing): YES.) Long Term Goal  Lab Results  Component Value Date   HGBA1C 7.2 (H) 02/27/2021    Assessed patient's understanding of A1c goal: <7% Provided education to patient about basic DM disease process; Reviewed medications with patient and discussed importance of medication adherence;        Discussed plans with patient for ongoing care management follow up and provided patient with direct contact information for care management team;      Reviewed scheduled/upcoming provider appointments including: No future appointments scheduled at this time;         Advised patient, providing education and rationale, to check cbg once daily and record        call provider for findings outside established parameters;       Referral made to social work team for  assistance with [place] in the community;      Patient's Right lateral foot wound completely healed. This RN Care Manager provided education on the importance of inspecting right foot for any breaks in the skin or redness.  Patient reports he has a person who looks at his right foot on a regular basis to inspect it. Patient checking blood sugar once a day in the evening.  Reports blood sugars range between 180 - 200 lately.  Patient denies any signs or symptoms of hypo or hyperglycemia. Patient has left eye blindness and has right eye blurred/low vision.  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 He is a patient of Dr. Iona Hansen at [facility]. Patient had office visit with Dr. Iona Hansen last month (March 2023) and has 6 month follow up appointment scheduled.   Referral made to [place] for [place] in the community.  Hypertension: (Status: Goal on Track (progressing): YES.) Long Term Goal  Last practice recorded BP readings:  BP Readings from Last 3 Encounters:  08/18/21 (!) 160/89  05/21/21 140/80  05/05/21 129/79  Most recent eGFR/CrCl:  Lab Results  Component Value Date   EGFR 31 (L) 05/21/2021  No components found for: CRCL  Evaluation of current treatment plan related to hypertension self management and patient's adherence to plan as established by provider;   Reviewed prescribed diet Low Sodium Reviewed medications with patient and discussed importance of compliance;  Counseled on the importance of exercise goals with target of 150 minutes per week Discussed plans with patient for ongoing care management follow up and provided patient with direct contact information for care management team; Advised patient, providing education and rationale, to monitor blood pressure daily and record, calling PCP for findings outside established parameters;  Reviewed scheduled/upcoming  provider appointments including:  Patient reports recent blood pressure readings at home have been varying but does not stay elevated.  We discussed continued monitoring of blood pressures and monitoring salt intake.  Patient does not use any added salt but is unable to read food labels due to his poor vision.  This RN Transport planner provided education on food products that are considered high in sodium: lunch meats, soups, canned vegetables, frozen dinners, processed foods versus the better choice of fresh or frozen vegetables.  Patient verbalized understanding.  Patient Goals/Self-Care Activities: Take medications as prescribed   Attend all scheduled provider appointments Call pharmacy for medication refills 3-7 days in advance of running out of medications Call provider office for new concerns or questions  check blood sugar at prescribed times: once daily check blood pressure daily limit salt intake to 2000 mg/day

## 2021-12-02 ENCOUNTER — Other Ambulatory Visit: Payer: Self-pay

## 2021-12-02 NOTE — Patient Outreach (Signed)
Care Coordination  12/02/2021  Todd Mccoy Oct 20, 1970 [id number]   Medicaid Managed Care   Unsuccessful Outreach Note  12/02/2021 Name: Todd Mccoy MRN: [id number] DOB: 1971-01-20  Referred by: [name], MD Reason for referral : High Risk Managed Medicaid (MM social work telephone outreach)   An unsuccessful telephone outreach was attempted today. The patient was referred to the case management team for assistance with care management and care coordination.   Follow Up Plan: A HIPAA compliant phone message was left for the patient providing contact information and requesting a return call.   Mickel Fuchs, BSW, [place] Managed Medicaid Team  [phone]

## 2021-12-02 NOTE — Patient Instructions (Signed)
Visit Information  [name]  - as a part of your Medicaid benefit, you are eligible for care management and care coordination services at no cost or copay. I was unable to reach you by phone today but would be happy to help you with your health related needs. Please feel free to call me @ [phone]  A member of the Managed Medicaid care management team will reach out to you again over the next 7 days.   Mickel Fuchs, BSW, [place] Managed Medicaid Team  [phone]

## 2021-12-03 DIAGNOSIS — Z419 Encounter for procedure for purposes other than remedying health state, unspecified: Secondary | ICD-10-CM | POA: Diagnosis not present

## 2021-12-15 ENCOUNTER — Other Ambulatory Visit: Payer: Self-pay

## 2021-12-15 ENCOUNTER — Ambulatory Visit: Payer: Medicaid Other | Admitting: Rehabilitation

## 2021-12-15 NOTE — Patient Instructions (Signed)
Visit Information  Mr. OMER PUCCINELLI  - as a part of your Medicaid benefit, you are eligible for care management and care coordination services at no cost or copay. I was unable to reach you by phone today but would be happy to help you with your health related needs. Please feel free to call me @ [phone].   A member of the Managed Medicaid care management team will reach out to you again over the next 7 days.   Mickel Fuchs, BSW, [place] Managed Medicaid Team  [phone]

## 2021-12-15 NOTE — Patient Outreach (Signed)
Care Coordination  12/15/2021  Todd Mccoy [birth year] [id number]   Medicaid Managed Care   Unsuccessful Outreach Note  12/15/2021 Name: Todd Mccoy MRN: [id number] DOB: May 29, 1971  Referred by: [name], MD Reason for referral : High Risk Managed Medicaid (MM Social work unsuccessful telephone outreach)   A second unsuccessful telephone outreach was attempted today. The patient was referred to the case management team for assistance with care management and care coordination.   Follow Up Plan: The care management team will reach out to the patient again over the next 7 days.   Mickel Fuchs, BSW, [place] Managed Medicaid Team  [phone]

## 2021-12-22 ENCOUNTER — Ambulatory Visit (INDEPENDENT_AMBULATORY_CARE_PROVIDER_SITE_OTHER): Payer: Medicaid Other | Admitting: Family

## 2021-12-22 ENCOUNTER — Telehealth: Payer: Self-pay

## 2021-12-22 DIAGNOSIS — Z89512 Acquired absence of left leg below knee: Secondary | ICD-10-CM | POA: Diagnosis not present

## 2021-12-22 DIAGNOSIS — G546 Phantom limb syndrome with pain: Secondary | ICD-10-CM

## 2021-12-22 DIAGNOSIS — R600 Localized edema: Secondary | ICD-10-CM

## 2021-12-22 MED ORDER — ACETAMINOPHEN-CODEINE 300-30 MG PO TABS: 1.0000 | ORAL_TABLET | Freq: Three times a day (TID) | ORAL | 0 refills | Status: DC | PRN

## 2021-12-22 NOTE — Telephone Encounter (Signed)
Prior auth sent through cover my meds for Tylenol #3 given at today's visit has been sent ot wellcare and will hold this message pending approval.

## 2021-12-23 ENCOUNTER — Encounter: Payer: Self-pay | Admitting: Family

## 2021-12-23 NOTE — Telephone Encounter (Signed)
Checked with cover my meds and prior authorization has been approved.

## 2021-12-23 NOTE — Progress Notes (Signed)
Office Visit Note   Patient: Todd Mccoy           Date of Birth: 1970/08/03           MRN: [id number] Visit Date: 12/22/2021              Requested by: [name], MD [address],  [place] [address] PCP: [name], MD  Chief Complaint  Patient presents with   Left Leg - Pain    Hx BKA      HPI: The patient is a 51 year old gentleman with a history of chronic combined systolic and diastolic heart failure as well as hypertension and diabetes and stage IV chronic kidney disease.  He  presents today complaining of worsening phantom pains to his left below-knee amputation.  He currently is taking Lyrica "wound 3 times a day" he states that when he initially began the Lyrica which was in December of last year this was helpful.  However he has had significant difficulty with phantom pain since his oxycodone was discontinued  Complains that his edema worsens his pain complaining of worse edema states he is unable to comfortably use his prosthesis for his left below-knee amputation due to edema this is bilateral.  States is taking his Lasix, hydralazine, Norvasc and Neurontin as scheduled.  Assessment & Plan: Visit Diagnoses: No diagnosis found.  Plan:  Discussed that edema likely related to his heart failure.  Encouraged compliance with medications.  Encouraged wear of his shrinker around-the-clock as well as wear of the prosthesis to prevent edema to the left residual limb.  We will provide a short course of Tylenol 3.  Explained we cannot provide prescriptions for narcotics on a long-term basis.  As well as the fact that narcotics are not a good treatment for phantom pain.  Follow-Up Instructions: Return in about 4 weeks (around 01/19/2022).   Ortho Exam  Patient is alert, oriented, no adenopathy, well-dressed, normal affect, normal respiratory effort. On examination of bilateral lower extremities he does have 2+ pitting edema to the left  residual limb this is well-healed there are no open areas no impending skin breakdown no warmth  Imaging: No results found. No images are attached to the encounter.  Labs: Lab Results  Component Value Date   HGBA1C 7.2 (H) 02/27/2021   HGBA1C 7.1 (A) 02/25/2021   HGBA1C 8.6 (H) 11/12/2020   ESRSEDRATE 134 (H) 12/10/2020   ESRSEDRATE 126 (H) 03/24/2020   CRP 29.8 (H) 12/10/2020   LABURIC 9.1 (H) 12/15/2020   REPTSTATUS 09/13/2021 FINAL 09/08/2021   GRAMSTAIN  09/08/2021    RARE WBC PRESENT, PREDOMINANTLY MONONUCLEAR FEW GRAM POSITIVE COCCI IN PAIRS    CULT  09/08/2021    MODERATE STAPHYLOCOCCUS AUREUS NO ANAEROBES ISOLATED Performed at [facility], [place] [address] [place], [place] [address]    LABORGA STAPHYLOCOCCUS AUREUS 09/08/2021     Lab Results  Component Value Date   ALBUMIN 2.4 (L) 05/21/2021   ALBUMIN 2.0 (L) 03/06/2021   ALBUMIN 2.0 (L) 03/05/2021   PREALBUMIN 5.5 (L) 12/10/2020    Lab Results  Component Value Date   MG 1.9 02/28/2021   MG 1.8 02/27/2021   MG 2.0 12/14/2020   No results found for: "VD25OH"  Lab Results  Component Value Date   PREALBUMIN 5.5 (L) 12/10/2020      Latest Ref Rng & Units 03/05/2021    4:56 AM 03/03/2021    4:59 AM 03/01/2021    5:17  AM  CBC EXTENDED  WBC 4.0 - 10.5 K/uL 6.8  7.2  6.1   RBC 4.22 - 5.81 MIL/uL 3.15  3.33  3.03   Hemoglobin 13.0 - 17.0 g/dL 8.8  9.4  8.5   HCT 39.0 - 52.0 % 28.9  31.3  27.6   Platelets 150 - 400 K/uL 241  221  209      There is no height or weight on file to calculate BMI.  Orders:  No orders of the defined types were placed in this encounter.  Meds ordered this encounter  Medications   acetaminophen-codeine (TYLENOL #3) 300-30 MG tablet    Sig: Take 1 tablet by mouth every 8 (eight) hours as needed for moderate pain.    Dispense:  21 tablet    Refill:  0     Procedures: No procedures performed  Clinical Data: No additional findings.  ROS:  All other systems  negative, except as noted in the HPI. Review of Systems  Objective: Vital Signs: There were no vitals taken for this visit.  Specialty Comments:  No specialty comments available.  PMFS History: Patient Active Problem List   Diagnosis Date Noted   Chronic combined systolic (congestive) and diastolic (congestive) heart failure (HCC) 04/07/2021   Chronic renal disease, stage 4, severely decreased glomerular filtration rate between 15-29 mL/min/1.73 square meter (HCC)    Hypertensive urgency    AKI (acute kidney injury) ([place])    Acute CHF (congestive heart failure) ([place]) 02/27/2021   Normocytic anemia 02/27/2021   Anasarca 02/27/2021   Abscess of left foot 12/10/2020   S/P BKA (below knee amputation) unilateral, left ([place]) 12/10/2020   Dyslipidemia 12/10/2020   CKD (chronic kidney disease), stage III ([place]) 12/10/2020   Subacute osteomyelitis, left ankle and foot (HCC)    Osteomyelitis of fifth toe of left foot (HCC)    Severe protein-calorie malnutrition ([place])    HTN (hypertension) 11/12/2020   Osteomyelitis of second toe of right foot ([place])    Cellulitis and abscess of foot    Toe osteomyelitis ([place]) 03/24/2020   Necrotizing fasciitis ([place]) 09/08/2014   Diabetes mellitus with hyperglycemia ([place]) 09/07/2014   Tobacco abuse 09/07/2014   Abscess of back    Abscess of lower back 09/06/2014   DM2 (diabetes mellitus, type 2) ([place]) 09/06/2014   Sepsis ([place]) 09/06/2014   Scrotal abscess 06/20/2014   Past Medical History:  Diagnosis Date   Anemia    Anemia    Anxiety    Chronic combined systolic (congestive) and diastolic (congestive) heart failure (HCC)    EF 40-45% with G3DD on echo 2022   CKD (chronic kidney disease), stage IV ([place]) 12/10/2020   Diabetes mellitus with complication (HCC)    Diabetic neuropathy (HCC)    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[place]) 03/25/2020   pt states retinal eye damage- left worse than the right- recent visited MD    Diabetic retinal damage  of both eyes ([place]) 03/25/2020   pt states recent MD visit /left eye worse than right   Dyslipidemia 12/10/2020   Hx of BKA, left ([place])    Hypertension    Morbid obesity ([place])    Osteomyelitis ([place])    Pneumonia     Family History  Problem Relation Age of Onset   Diabetes Mother    Diabetes Father    Diabetes Brother    Colon cancer Neg Hx    Esophageal cancer Neg Hx    Pancreatic cancer Neg Hx  Stomach cancer Neg Hx    Liver disease Neg Hx    CAD Neg Hx     Past Surgical History:  Procedure Laterality Date   ABSCESS DRAINAGE     neck   AMPUTATION Left 11/14/2020   Procedure: LEFT 5TH RAY AMPUTATION;  Surgeon: Newt Minion, MD;  Location: [facility];  Service: Orthopedics;  Laterality: Left;   AMPUTATION Left 12/10/2020   Procedure: LEFT BELOW KNEE AMPUTATION;  Surgeon: Newt Minion, MD;  Location: [facility];  Service: Orthopedics;  Laterality: Left;   AMPUTATION TOE Right 03/25/2020   Procedure: AMPUTATION TOE;  Surgeon: Evelina Bucy, DPM;  Location: [facility];  Service: Podiatry;  Laterality: Right;   INCISION AND DRAINAGE ABSCESS Right 09/07/2014   Procedure: INCISION AND DRAINAGE ABSCESS RIGHT FLANK;  Surgeon: Jackolyn Confer, MD;  Location: [facility];  Service: General;  Laterality: Right;   LEG AMPUTATION BELOW KNEE Left 12/10/2020   SCROTUM EXPLORATION     Social History   Occupational History   Not on file  Tobacco Use   Smoking status: Former    Packs/day: 1.00    Years: 32.00    Total pack years: 32.00    Types: Cigarettes    Quit date: 08/24/2021    Years since quitting: 0.3    Passive exposure: Past   Smokeless tobacco: Never   Tobacco comments:    Quit smoking 1 month ago (February 2023)  Vaping Use   Vaping Use: Never used  Substance and Sexual Activity   Alcohol use: No   Drug use: No   Sexual activity: Not on file

## 2021-12-24 ENCOUNTER — Other Ambulatory Visit: Payer: Self-pay

## 2021-12-24 NOTE — Patient Instructions (Signed)
Visit Information  [name] was given information about Medicaid Managed Care team care coordination services as a part of their [facility] Medicaid benefit. Todd Mccoy verbally consented to engagement with the [facility] Managed Care team.   If you are experiencing a medical emergency, please call 911 or report to your local emergency department or urgent care.   If you have a non-emergency medical problem during routine business hours, please contact your provider's office and ask to speak with a nurse.   For questions related to your [facility] health plan, please call: [phone] or go here:[email or web address]  If you would like to schedule transportation through your [facility] plan, please call the following number at least 2 days in advance of your appointment: [phone].  You can also use the MTM portal or MTM mobile app to manage your rides. For the portal, please go to [email or web address].  Call the [facility] at [phone], at any time, 24 hours a day, 7 days a week. If you are in danger or need immediate medical attention call 911.  If you would like help to quit smoking, call 1-800-QUIT-NOW 607-725-9140) OR Espaol: 1-855-Djelo-Ya (2-202-542-7062) o para ms informacin haga clic aqu or Text READY to 200-400 to register via text  Todd Mccoy - following are the goals we discussed in your visit today:   Goals Addressed   None       The  Patient                                              has been provided with contact information for the Managed Medicaid care management team and has been advised to call with any health related questions or concerns.   Todd Mccoy, BSW, [place] Managed Medicaid Team  [phone]   Following is a copy of your plan of care:  There are no care plans that you recently modified to display for this patient.

## 2021-12-24 NOTE — Patient Outreach (Signed)
Medicaid Managed Care Social Work Note  12/24/2021 Name:  Todd Mccoy MRN:  [id number] DOB:  Sep 22, 1970  Todd Mccoy is an 51 y.o. year old male who is a primary patient of Todd Rakes, MD.  The [facility] Managed Care Coordination team was consulted for assistance with:   Vision   [name] was given information about Medicaid Managed Care Coordination team services today. Todd Mccoy Patient agreed to services and verbal consent obtained.  Engaged with patient  for by telephone forinitial visit in response to referral for case management and/or care coordination services.   Assessments/Interventions:  Review of past medical history, allergies, medications, health status, including review of consultants reports, laboratory and other test data, was performed as part of comprehensive evaluation and provision of chronic care management services.  SDOH: (Social Determinant of Health) assessments and interventions performed: BSW received a referral for patient for vision resources. BSW completed a telephone outreach with patient, he stated he no longer needed any vision resources and no other resources are needed at this time.   Advanced Directives Status:  Not addressed in this encounter.  Care Plan                 No Known Allergies  Medications Reviewed Today     Reviewed by Suzan Slick, NP (Nurse Practitioner) on 12/23/21 at [place] List Status: <None>   Medication Order Taking? Sig Documenting Provider Last Dose Status Informant  acetaminophen (TYLENOL) 325 MG tablet [id number] No Take 650 mg by mouth every 6 (six) hours as needed for mild pain. [provider] Taking Active Self  acetaminophen-codeine (TYLENOL #3) 300-30 MG tablet [id number] Yes Take 1 tablet by mouth every 8 (eight) hours as needed for moderate pain. Suzan Slick, NP  Active   amLODipine (NORVASC) 10 MG tablet [id number] No Take 1 tablet (10 mg total) by mouth daily. [name], MD Taking Active   atorvastatin (LIPITOR) 20 MG tablet [id number] No Take 1 tablet (20 mg total) by mouth daily. [name], MD Taking Active   carvedilol (COREG) 25 MG tablet [id number] No Take 1 tablet (25 mg total) by mouth 2 (two) times daily. [name], MD Taking Active   doxycycline (VIBRAMYCIN) 100 MG capsule [id number] No Take 1 capsule (100 mg total) by mouth 2 (two) times daily.  Patient not taking: Reported on 09/30/2021   Todd Mattocks, PA-C Not Taking Active            Med Note [facility], Gwenette Greet A   Wed Sep 30, 2021  9:17 AM) Completed  ferrous sulfate 325 (65 FE) MG tablet [id number] No Take 1 tablet (325 mg total) by mouth 3 (three) times daily with meals.  Patient taking differently: Take 325 mg by mouth in the morning and at bedtime.   Todd James, MD Taking Expired 04/11/21 2359   furosemide (LASIX) 80 MG tablet [id number] No Take 1 tablet (80 mg total) by mouth daily. [name], MD Taking Active   gabapentin (NEURONTIN) 300 MG capsule [id number]  Take 1 capsule (300 mg total) by mouth 3 (three) times daily. Suzan Slick, NP  Active   hydrALAZINE (APRESOLINE) 100 MG tablet [id number] No Take 1 tablet (100 mg total) by mouth 3 (three) times daily. Kathie Dike, MD Taking Active   hydrOXYzine (ATARAX) 25 MG tablet [id number]  TAKE 1 TABLET BY MOUTH AT BEDTIME AS NEEDED Todd Rakes, MD  Active  Discontinued 05/13/20 1120   insulin glargine (LANTUS) 100 UNIT/ML Solostar Pen [id number] No Inject 10 Units into the skin at bedtime. Kathie Dike, MD Taking Active   isosorbide mononitrate (IMDUR) 60 MG 24 hr tablet [id number] No Take 1 tablet (60 mg total) by mouth daily. [name], MD Taking Active   Discontinued 12/22/20 1205 (Stop Taking at Discharge)   Discontinued 12/22/20 1205 (Stop Taking at Discharge)   Naphazoline HCl (CLEAR EYES OP) [id number] No Place 1 drop into both eyes as needed (irritation). [provider] Taking Active Self  polyethylene glycol powder (GLYCOLAX/MIRALAX) 17 GM/SCOOP powder [id number] No Take 17 g by mouth 2 (two) times daily as needed.  Patient not taking: Reported on 09/30/2021   Todd Rakes, MD Not Taking Active Self            Patient Active Problem List   Diagnosis Date Noted   Chronic combined systolic (congestive) and diastolic (congestive) heart failure ([place]) 04/07/2021   Chronic renal disease, stage 4, severely decreased glomerular filtration rate between 15-29 mL/min/1.73 square meter (HCC)    Hypertensive urgency    AKI (acute kidney injury) ([place])    Acute CHF (congestive heart failure) ([place]) 02/27/2021   Normocytic anemia 02/27/2021   Anasarca 02/27/2021   Abscess of left foot 12/10/2020   S/P BKA (below knee amputation) unilateral, left ([place]) 12/10/2020   Dyslipidemia 12/10/2020   CKD (chronic kidney disease), stage III ([place]) 12/10/2020   Subacute osteomyelitis, left ankle and foot (HCC)    Osteomyelitis of fifth toe of left foot (HCC)    Severe protein-calorie malnutrition ([place])    HTN (hypertension) 11/12/2020   Osteomyelitis of second toe of right foot ([place])    Cellulitis and abscess of foot    Toe osteomyelitis ([place]) 03/24/2020   Necrotizing fasciitis ([place]) 09/08/2014   Diabetes mellitus with hyperglycemia ([place]) 09/07/2014   Tobacco abuse 09/07/2014   Abscess of back    Abscess of lower back 09/06/2014   DM2 (diabetes mellitus, type 2) ([place]) 09/06/2014   Sepsis ([place]) 09/06/2014   Scrotal abscess 06/20/2014    Conditions to be addressed/monitored per PCP order:   vision resources  There are no care plans that you recently modified to display for this patient.   Follow up:  Patient agrees to Care Plan and Follow-up.  Plan: The  Patient has been provided with contact information for the Managed Medicaid care management team and has been advised to call with any health related questions or concerns.    Mickel Fuchs, BSW, [place] Managed Medicaid Team  [phone]

## 2021-12-29 ENCOUNTER — Ambulatory Visit: Payer: Medicaid Other | Admitting: Rehabilitation

## 2021-12-30 ENCOUNTER — Ambulatory Visit: Payer: Medicaid Other

## 2022-01-02 DIAGNOSIS — Z419 Encounter for procedure for purposes other than remedying health state, unspecified: Secondary | ICD-10-CM | POA: Diagnosis not present

## 2022-01-09 ENCOUNTER — Other Ambulatory Visit: Payer: Self-pay

## 2022-01-09 ENCOUNTER — Emergency Department (HOSPITAL_COMMUNITY)
Admission: EM | Admit: 2022-01-09 | Discharge: 2022-01-09 | Discharge status: Against Medical Advice | Payer: Medicaid Other | Attending: Emergency Medicine | Admitting: Emergency Medicine

## 2022-01-09 DIAGNOSIS — M7989 Other specified soft tissue disorders: Secondary | ICD-10-CM | POA: Insufficient documentation

## 2022-01-09 DIAGNOSIS — Z5321 Procedure and treatment not carried out due to patient leaving prior to being seen by health care provider: Secondary | ICD-10-CM | POA: Diagnosis not present

## 2022-01-09 DIAGNOSIS — I10 Essential (primary) hypertension: Secondary | ICD-10-CM | POA: Insufficient documentation

## 2022-01-09 DIAGNOSIS — R2243 Localized swelling, mass and lump, lower limb, bilateral: Secondary | ICD-10-CM | POA: Diagnosis not present

## 2022-01-09 LAB — BASIC METABOLIC PANEL
Anion gap: 7 (ref 5–15)
BUN: 36 mg/dL — ABNORMAL HIGH (ref 6–20)
CO2: 21 mmol/L — ABNORMAL LOW (ref 22–32)
Calcium: 7.9 mg/dL — ABNORMAL LOW (ref 8.9–10.3)
Chloride: 112 mmol/L — ABNORMAL HIGH (ref 98–111)
Creatinine, Ser: 3.82 mg/dL — ABNORMAL HIGH (ref 0.61–1.24)
GFR, Estimated: 18 mL/min — ABNORMAL LOW (ref >60)
Glucose, Bld: 112 mg/dL — ABNORMAL HIGH (ref 70–99)
Potassium: 3.6 mmol/L (ref 3.5–5.1)
Sodium: 140 mmol/L (ref 135–145)

## 2022-01-09 LAB — CBC
HCT: 37.4 % — ABNORMAL LOW (ref 39.0–52.0)
Hemoglobin: 11.2 g/dL — ABNORMAL LOW (ref 13.0–17.0)
MCH: 27.2 pg (ref 26.0–34.0)
MCHC: 29.9 g/dL — ABNORMAL LOW (ref 30.0–36.0)
MCV: 90.8 fL (ref 80.0–100.0)
Platelets: 168 10*3/uL (ref 150–400)
RBC: 4.12 MIL/uL — ABNORMAL LOW (ref 4.22–5.81)
RDW: 15.7 % — ABNORMAL HIGH (ref 11.5–15.5)
WBC: 4.7 10*3/uL (ref 4.0–10.5)
nRBC: 0 % (ref 0.0–0.2)

## 2022-01-09 NOTE — ED Triage Notes (Signed)
Patient reports bilateral LE swelling, left worse than right. X 1-2 weeks. Says he ran out of medicine and just needs more. Takes LASIX. Bp in triage 205/126, patient says it runs high sometimes, takes htn meds 3x daily, has not taken 2nd dose for day. Pain denied in triage.

## 2022-01-09 NOTE — ED Notes (Signed)
Pt called for in ED lobby for vitals reassessment- no answer x2. ENMiles 

## 2022-01-09 NOTE — ED Notes (Signed)
Pt called for in ED lobby for vitals reassessment; no answer x3. RN advised. [employer]

## 2022-01-09 NOTE — ED Provider Triage Note (Signed)
Emergency Medicine Provider Triage Evaluation Note  Todd Mccoy , a 51 y.o. male  was evaluated in triage.  Pt complains of bilateral leg swelling x several weeks. Usually prescribed lasix 80mg  as needed, but ran out several months ago. Hasn't taken all of his HTN meds today either.   Review of Systems  Positive: Leg swelling Negative: Abd pain, CP, SOB  Physical Exam  BP (!) 205/126   Pulse 90   Temp 98 F (36.7 C) (Oral)   Resp 18   Ht 5\' 10"  (1.778 m)   Wt 86.2 kg   SpO2 96%   BMI 27.26 kg/m  Gen:   Awake, no distress   Resp:  Normal effort  MSK:   Moves extremities without difficulty  Other:  Bilateral 2+ pitting edema  Medical Decision Making  Medically screening exam initiated at 1:36 PM.  Appropriate orders placed.  Todd Mccoy was informed that the remainder of the evaluation will be completed by another provider, this initial triage assessment does not replace that evaluation, and the importance of remaining in the ED until their evaluation is complete.  Will order basic labs prior to fluid removal   Todd Mccoy T, PA-C 01/09/22 1338

## 2022-01-09 NOTE — ED Notes (Signed)
Pt called for in ED lobby for vitals reassessment- no answer x1. ENMiles 

## 2022-01-10 ENCOUNTER — Encounter (HOSPITAL_COMMUNITY): Payer: Self-pay

## 2022-01-10 ENCOUNTER — Emergency Department (HOSPITAL_COMMUNITY)
Admission: EM | Admit: 2022-01-10 | Discharge: 2022-01-10 | Disposition: A | Discharge status: Home / Self Care | Payer: Medicaid Other | Attending: Physician Assistant | Admitting: Physician Assistant

## 2022-01-10 ENCOUNTER — Emergency Department (HOSPITAL_COMMUNITY): Payer: Medicaid Other

## 2022-01-10 DIAGNOSIS — I10 Essential (primary) hypertension: Secondary | ICD-10-CM | POA: Diagnosis not present

## 2022-01-10 DIAGNOSIS — Z5321 Procedure and treatment not carried out due to patient leaving prior to being seen by health care provider: Secondary | ICD-10-CM | POA: Diagnosis not present

## 2022-01-10 DIAGNOSIS — Z760 Encounter for issue of repeat prescription: Secondary | ICD-10-CM | POA: Insufficient documentation

## 2022-01-10 NOTE — ED Notes (Signed)
No answer from pt when called x 1.

## 2022-01-10 NOTE — ED Triage Notes (Signed)
Pt presents with c/o request for medication refill. Pt reports he currently takes Lasix and needs more of the same but does not have any refills. Pt hypertensive in triage, not abnormal for pt. Pt does report taking medication for HTN.

## 2022-01-10 NOTE — ED Notes (Signed)
No answer from pt. called to triage x 2.

## 2022-01-13 ENCOUNTER — Ambulatory Visit: Payer: Medicaid Other

## 2022-01-19 ENCOUNTER — Ambulatory Visit (INDEPENDENT_AMBULATORY_CARE_PROVIDER_SITE_OTHER): Payer: Medicaid Other | Admitting: Family

## 2022-01-19 ENCOUNTER — Other Ambulatory Visit: Payer: Self-pay | Admitting: *Deleted

## 2022-01-19 ENCOUNTER — Encounter: Payer: Self-pay | Admitting: Family

## 2022-01-19 ENCOUNTER — Telehealth: Payer: Self-pay | Admitting: Cardiology

## 2022-01-19 DIAGNOSIS — R600 Localized edema: Secondary | ICD-10-CM

## 2022-01-19 DIAGNOSIS — Z89512 Acquired absence of left leg below knee: Secondary | ICD-10-CM | POA: Diagnosis not present

## 2022-01-19 DIAGNOSIS — L98499 Non-pressure chronic ulcer of skin of other sites with unspecified severity: Secondary | ICD-10-CM | POA: Diagnosis not present

## 2022-01-19 DIAGNOSIS — G546 Phantom limb syndrome with pain: Secondary | ICD-10-CM

## 2022-01-19 DIAGNOSIS — I872 Venous insufficiency (chronic) (peripheral): Secondary | ICD-10-CM | POA: Diagnosis not present

## 2022-01-19 DIAGNOSIS — N049 Nephrotic syndrome with unspecified morphologic changes: Secondary | ICD-10-CM

## 2022-01-19 DIAGNOSIS — E1122 Type 2 diabetes mellitus with diabetic chronic kidney disease: Secondary | ICD-10-CM

## 2022-01-19 MED ORDER — ISOSORBIDE MONONITRATE ER 60 MG PO TB24: 60.0000 mg | ORAL_TABLET | Freq: Every day | ORAL | 0 refills | Status: DC

## 2022-01-19 MED ORDER — FUROSEMIDE 80 MG PO TABS: 80.0000 mg | ORAL_TABLET | Freq: Every day | ORAL | 0 refills | Status: DC

## 2022-01-19 MED ORDER — AMLODIPINE BESYLATE 10 MG PO TABS: 10.0000 mg | ORAL_TABLET | Freq: Every day | ORAL | 0 refills | Status: DC

## 2022-01-19 MED ORDER — ACETAMINOPHEN-CODEINE 300-30 MG PO TABS: 1.0000 | ORAL_TABLET | Freq: Two times a day (BID) | ORAL | 0 refills | Status: DC | PRN

## 2022-01-19 NOTE — Telephone Encounter (Signed)
Pt's medications were sent to pt's pharmacy as requested. Confirmation received.  

## 2022-01-19 NOTE — Telephone Encounter (Signed)
*  STAT* If patient is at the pharmacy, call can be transferred to refill team.   1. Which medications need to be refilled? (please list name of each medication and dose if known) amLODipine (NORVASC) 10 MG tablet furosemide (LASIX) 80 MG tablet isosorbide mononitrate (IMDUR) 60 MG 24 hr tablet 2. Which pharmacy/location (including street and city if local pharmacy) is medication to be sent to?[facility] [place] ([place]), [place] - [address]  3. Do they need a 30 day or 90 day supply? 1 week  Tabitha with [facility] states that pt is completely out of these medications. Pt made a 1 yr f/u with Harriet Pho PA 01/27/22 at 11:00am

## 2022-01-19 NOTE — Patient Instructions (Signed)
Visit Information  [name] was given information about Medicaid Managed Care team care coordination services as a part of their [facility] Medicaid benefit. Todd Mccoy verbally consented to engagement with the [facility] Managed Care team.   If you are experiencing a medical emergency, please call 911 or report to your local emergency department or urgent care.   If you have a non-emergency medical problem during routine business hours, please contact your provider's office and ask to speak with a nurse.   For questions related to your [facility] health plan, please call: [phone] or go here:[email or web address]  If you would like to schedule transportation through your [facility] plan, please call the following number at least 2 days in advance of your appointment: [phone].  You can also use the MTM portal or MTM mobile app to manage your rides. For the portal, please go to [email or web address].  Call the [place] at [phone], at any time, 24 hours a day, 7 days a week. If you are in danger or need immediate medical attention call 911.  If you would like help to quit smoking, call 1-800-QUIT-NOW 979-042-5452) OR Espaol: 1-855-Djelo-Ya (5-631-497-0263) o para ms informacin haga clic aqu or Text READY to 200-400 to register via text  [name],   Please see education materials related to HF and HTN provided as print materials.   The patient verbalized understanding of instructions, educational materials, and care plan provided today and agreed to receive a mailed copy of patient instructions, educational materials, and care plan.   Telephone follow up appointment with Managed Medicaid care management team member scheduled for:02/04/22 @ 11:15am  Todd Joiner RN, [name] [place] RN Care Coordinator   Following is a copy of your plan of care:  Care Plan : RN Care Manager Plan of Care   Updates made by Todd Montane, RN since 01/19/2022 12:00 AM     Problem: Chronic Disease Management and Care Coordination Needs for CHF, HTN, DM   Priority: High     Long-Range Goal: Development of Plan of Care for Chronic Disease Management and Care Coordination Needs (CHF, HTN, DM)   Start Date: 09/30/2021  Expected End Date: 04/02/2022  Priority: High  Note:   Current Barriers:  Knowledge Deficits related to plan of care for management of CHF, HTN, and DMII  Chronic Disease Management support and education needs related to CHF, HTN, and DMII Difficulty obtaining medications Recent Vision 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  RNCM Clinical Goal(s):  Patient will verbalize understanding of plan for management of CHF, HTN, and DMII as evidenced by improvement in management of these chronic diseases. verbalize basic understanding of CHF, HTN, and DMII disease process and self health management plan as evidenced by improved control of factors that cause CHF, improved blood pressure readings < 140/90 and improved control of blood sugar readings with range with 80 - 120. take all medications exactly as prescribed and will call provider for medication related questions as evidenced by compliance with medications3/31/    attend all scheduled medical appointments: No future appointments scheduled as evidenced by attending all scheduled appointments        demonstrate improved adherence to prescribed treatment plan for CHF, HTN, and DMII as evidenced by better control of blood pressure, blood sugars and factors impacting CHF. continue to work with Consulting civil engineer and/or Social Worker to address care management and care coordination needs  related to CHF, HTN, and DMII as evidenced by adherence to CM Team Scheduled appointments     through collaboration with RN Care manager, provider, and care team.   Interventions: Inter-disciplinary care team  collaboration (see longitudinal plan of care) Evaluation of current treatment plan related to  self management and patient's adherence to plan as established by provider   Heart Failure Interventions:  (Status: Goal on track: NO.)  Long Term Goal  Wt Readings from Last 3 Encounters:  01/09/22 190 lb (86.2 kg)  04/28/21 180 lb (81.6 kg)  04/23/21 180 lb (81.6 kg)    Reviewed Heart Failure Action Plan in depth and provided written copy Discussed the importance of keeping all appointments with provider Assisted patient rescheduling missed Cardiology appointment-[facility] 01/27/22 @ 11am Reviewed medications-requested needed medication refills Advised patient to take all medications and BP readings to visit on 01/27/22 Discussed compliance packaging benefits, offered to assist patient with switching to a pharmacy offering compliance packaging-declines at this time, will wait until after Cardiology appointment  Diabetes:  (Status: Goal on Track (progressing): YES.) Long Term Goal  Lab Results  Component Value Date   HGBA1C 7.2 (H) 02/27/2021    Assessed patient's understanding of A1c goal: <7% Reviewed medications with patient and discussed importance of medication adherence;        Discussed plans with patient for ongoing care management follow up and provided patient with direct contact information for care management team;      Reviewed scheduled/upcoming provider appointments including: PCP 02/24/22;         Advised patient, providing education and rationale, to check cbg once daily and record        call provider for findings outside established parameters;       Patient checking blood sugar once a day in the evening.  Reports blood sugars range between 110-140      Hypertension: (Status: Goal on track: NO.) Long Term Goal  Last practice recorded BP readings:  BP Readings from Last 3 Encounters:  01/10/22 (!) 197/120  01/09/22 (!) 205/126  08/18/21 (!) 160/89  Most recent  eGFR/CrCl:  Lab Results  Component Value Date   EGFR 31 (L) 05/21/2021    No components found for: "CRCL"  Evaluation of current treatment plan related to hypertension self management and patient's adherence to plan as established by provider;   Reviewed prescribed diet managing HTN Reviewed medications with patient and discussed importance of compliance;  Discussed plans with patient for ongoing care management follow up and provided patient with direct contact information for care management team; Advised patient, providing education and rationale, to monitor blood pressure daily and record, calling PCP for findings outside established parameters;  Reviewed scheduled/upcoming provider appointments including:  Assist patient with rescheduling missed Cardiology appointment from March 2023-[facility] 01/27/22 @ 11am .  Patient Goals/Self-Care Activities: Take medications as prescribed   Attend all scheduled provider appointments Call pharmacy for medication refills 3-7 days in advance of running out of medications Call provider office for new concerns or questions  check blood sugar at prescribed times: once daily check blood pressure daily limit salt intake to 2000 mg/day

## 2022-01-19 NOTE — Progress Notes (Signed)
Office Visit Note   Patient: Todd Mccoy           Date of Birth: [birth year]           MRN: [id number] Visit Date: 01/19/2022              Requested by: [name], MD [address],  [place] [address] PCP: [name], MD  Chief Complaint  Patient presents with   Left Leg - Edema    Left BKA       HPI: The patient is a 51 year old gentleman who presents today with ongoing concerns of his left residual limb he has significant edema today reports that he is out of his Lasix and has been for quite some time.  He states he has been in contact with his primary care provider and has an appointment for next month He is concerned about chronic pain to his left residual limb as well as new ulcers that he feels are coming from edema  Has had a recent emergency department visit for his edema as well  Has scattered venous appearing ulcerations along right anterior shin he feels are from his uncontrolled edema as well  Unsure when he last has been seen by cardiology  Assessment & Plan: Visit Diagnoses: No diagnosis found.  Plan: Have provided a last refill of his Tylenol 3.  Discussed the importance of compression for his lower extremity edema as well as medical management.  He does have a shrinker encouraged him to wear this on his left residual limb keep the wound clean and dry he will follow-up with [place] in 4 weeks if this fails to improve  Follow-Up Instructions: No follow-ups on file.   Ortho Exam  Patient is alert, oriented, no adenopathy, well-dressed, normal affect, normal respiratory effort. On examination of the left residual limb he does have pitting edema there is no erythema no weeping.  He does have a proximal knee ulcer which is 15 mm in diameter this is superficial filled in with granulation to the right lower extremity there is 2+ pitting edema up to the tibial tubercle he has 3 anterior ulcers which are no greater than 1 cm in diameter  these are 1 mm deep filled in with fibrinous exudative tissue there is 1 drop of serous drainage no erythema no warmth no sign of cellulitis  Imaging: No results found. No images are attached to the encounter.  Labs: Lab Results  Component Value Date   HGBA1C 7.2 (H) 02/27/2021   HGBA1C 7.1 (A) 02/25/2021   HGBA1C 8.6 (H) 11/12/2020   ESRSEDRATE 134 (H) 12/10/2020   ESRSEDRATE 126 (H) 03/24/2020   CRP 29.8 (H) 12/10/2020   LABURIC 9.1 (H) 12/15/2020   REPTSTATUS 09/13/2021 FINAL 09/08/2021   GRAMSTAIN  09/08/2021    RARE WBC PRESENT, PREDOMINANTLY MONONUCLEAR FEW GRAM POSITIVE COCCI IN PAIRS    CULT  09/08/2021    MODERATE STAPHYLOCOCCUS AUREUS NO ANAEROBES ISOLATED Performed at [facility], [place] [address], [place], [place] [address]    LABORGA STAPHYLOCOCCUS AUREUS 09/08/2021     Lab Results  Component Value Date   ALBUMIN 2.4 (L) 05/21/2021   ALBUMIN 2.0 (L) 03/06/2021   ALBUMIN 2.0 (L) 03/05/2021   PREALBUMIN 5.5 (L) 12/10/2020    Lab Results  Component Value Date   MG 1.9 02/28/2021   MG 1.8 02/27/2021   MG 2.0 12/14/2020   No results found for: "VD25OH"  Lab Results  Component  Value Date   PREALBUMIN 5.5 (L) 12/10/2020      Latest Ref Rng & Units 01/09/2022    1:57 PM 03/05/2021    4:56 AM 03/03/2021    4:59 AM  CBC EXTENDED  WBC 4.0 - 10.5 K/uL 4.7  6.8  7.2   RBC 4.22 - 5.81 MIL/uL 4.12  3.15  3.33   Hemoglobin 13.0 - 17.0 g/dL 11.2  8.8  9.4   HCT 39.0 - 52.0 % 37.4  28.9  31.3   Platelets 150 - 400 K/uL 168  241  221      There is no height or weight on file to calculate BMI.  Orders:  No orders of the defined types were placed in this encounter.  No orders of the defined types were placed in this encounter.    Procedures: No procedures performed  Clinical Data: No additional findings.  ROS:  All other systems negative, except as noted in the HPI. Review of Systems  Objective: Vital Signs: There were no vitals taken for  this visit.  Specialty Comments:  No specialty comments available.  PMFS History: Patient Active Problem List   Diagnosis Date Noted   Chronic combined systolic (congestive) and diastolic (congestive) heart failure (HCC) 04/07/2021   Chronic renal disease, stage 4, severely decreased glomerular filtration rate between 15-29 mL/min/1.73 square meter (HCC)    Hypertensive urgency    AKI (acute kidney injury) ([place])    Acute CHF (congestive heart failure) ([place]) 02/27/2021   Normocytic anemia 02/27/2021   Anasarca 02/27/2021   Abscess of left foot 12/10/2020   S/P BKA (below knee amputation) unilateral, left ([place]) 12/10/2020   Dyslipidemia 12/10/2020   CKD (chronic kidney disease), stage III ([place]) 12/10/2020   Subacute osteomyelitis, left ankle and foot (HCC)    Osteomyelitis of fifth toe of left foot (HCC)    Severe protein-calorie malnutrition ([place])    HTN (hypertension) 11/12/2020   Osteomyelitis of second toe of right foot ([place])    Cellulitis and abscess of foot    Toe osteomyelitis ([place]) 03/24/2020   Necrotizing fasciitis ([place]) 09/08/2014   Diabetes mellitus with hyperglycemia ([place]) 09/07/2014   Tobacco abuse 09/07/2014   Abscess of back    Abscess of lower back 09/06/2014   DM2 (diabetes mellitus, type 2) ([place]) 09/06/2014   Sepsis ([place]) 09/06/2014   Scrotal abscess 06/20/2014   Past Medical History:  Diagnosis Date   Anemia    Anemia    Anxiety    Chronic combined systolic (congestive) and diastolic (congestive) heart failure (HCC)    EF 40-45% with G3DD on echo 2022   CKD (chronic kidney disease), stage IV ([place]) 12/10/2020   Diabetes mellitus with complication (HCC)    Diabetic neuropathy (HCC)    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[place]) 03/25/2020   pt states retinal eye damage- left worse than the right- recent visited MD    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[place]) 03/25/2020   pt states recent MD visit /left eye worse than right   Dyslipidemia 12/10/2020    Hx of BKA, left ([place])    Hypertension    Morbid obesity ([place])    Osteomyelitis ([place])    Pneumonia     Family History  Problem Relation Age of Onset   Diabetes Mother    Diabetes Father    Diabetes Brother    Colon cancer Neg Hx    Esophageal cancer Neg Hx    Pancreatic cancer Neg Hx  Stomach cancer Neg Hx    Liver disease Neg Hx    CAD Neg Hx     Past Surgical History:  Procedure Laterality Date   ABSCESS DRAINAGE     neck   AMPUTATION Left 11/14/2020   Procedure: LEFT 5TH RAY AMPUTATION;  Surgeon: Newt Minion, MD;  Location: [facility];  Service: Orthopedics;  Laterality: Left;   AMPUTATION Left 12/10/2020   Procedure: LEFT BELOW KNEE AMPUTATION;  Surgeon: Newt Minion, MD;  Location: [facility];  Service: Orthopedics;  Laterality: Left;   AMPUTATION TOE Right 03/25/2020   Procedure: AMPUTATION TOE;  Surgeon: Evelina Bucy, DPM;  Location: [facility];  Service: Podiatry;  Laterality: Right;   INCISION AND DRAINAGE ABSCESS Right 09/07/2014   Procedure: INCISION AND DRAINAGE ABSCESS RIGHT FLANK;  Surgeon: Jackolyn Confer, MD;  Location: [facility];  Service: General;  Laterality: Right;   LEG AMPUTATION BELOW KNEE Left 12/10/2020   SCROTUM EXPLORATION     Social History   Occupational History   Not on file  Tobacco Use   Smoking status: Former    Packs/day: 1.00    Years: 32.00    Total pack years: 32.00    Types: Cigarettes    Quit date: 08/24/2021    Years since quitting: 0.4    Passive exposure: Past   Smokeless tobacco: Never   Tobacco comments:    Quit smoking 1 month ago (February 2023)  Vaping Use   Vaping Use: Never used  Substance and Sexual Activity   Alcohol use: No   Drug use: No   Sexual activity: Not on file

## 2022-01-19 NOTE — Patient Outreach (Signed)
Medicaid Managed Care   Nurse Care Manager Note  01/19/2022 Name:  Todd Mccoy MRN:  [id number] DOB:  05/10/1971  Todd Mccoy is an 51 y.o. year old male who is a primary patient of [name], MD.  The [facility] Managed Care Coordination team was consulted for assistance with:    CHF HTN DMII  [name] was given information about Medicaid Managed Care Coordination team services today. Todd Mccoy Patient agreed to services and verbal consent obtained.  Engaged with patient by telephone for follow up visit in response to provider referral for case management and/or care coordination services.   Assessments/Interventions:  Review of past medical history, allergies, medications, health status, including review of consultants reports, laboratory and other test data, was performed as part of comprehensive evaluation and provision of chronic care management services.  SDOH (Social Determinants of Health) assessments and interventions performed:   Care Plan  No Known Allergies  Medications Reviewed Today     Reviewed by Melissa Montane, RN (Registered Nurse) on 01/19/22 at 1332  Med List Status: <None>   Medication Order Taking? Sig Documenting Provider Last Dose Status Informant  acetaminophen (TYLENOL) 325 MG tablet [id number] Yes Take 650 mg by mouth every 6 (six) hours as needed for mild pain. [provider] Taking Active Self  acetaminophen-codeine (TYLENOL #3) 300-30 MG tablet [id number] Yes Take 1 tablet by mouth every 12 (twelve) hours as needed for moderate pain. Suzan Slick, NP Taking Active   amLODipine (NORVASC) 10 MG tablet [id number] No Take 1 tablet (10 mg total) by mouth daily.  Patient not taking: Reported on 01/19/2022   Todd Margarita, MD Not Taking Active   atorvastatin (LIPITOR) 20 MG tablet [id number] Yes Take 1 tablet (20 mg total) by mouth daily. Todd Margarita, MD Taking Active   carvedilol (COREG) 25 MG tablet [id number]  Yes Take 1 tablet (25 mg total) by mouth 2 (two) times daily. Todd Margarita, MD Taking Active   doxycycline (VIBRAMYCIN) 100 MG capsule [id number] No Take 1 capsule (100 mg total) by mouth 2 (two) times daily.  Patient not taking: Reported on 09/30/2021   Todd Mattocks, PA-C Not Taking Active            Med Note [facility], Gwenette Greet A   Wed Sep 30, 2021  9:17 AM) Completed  ferrous sulfate 325 (65 FE) MG tablet [id number]  Take 1 tablet (325 mg total) by mouth 3 (three) times daily with meals.  Patient taking differently: Take 325 mg by mouth in the morning and at bedtime.   Todd James, MD  Expired 04/11/21 2359   furosemide (LASIX) 80 MG tablet [id number] No Take 1 tablet (80 mg total) by mouth daily.  Patient not taking: Reported on 01/19/2022   Todd Margarita, MD Not Taking Active   gabapentin (NEURONTIN) 300 MG capsule [id number] No Take 1 capsule (300 mg total) by mouth 3 (three) times daily.  Patient not taking: Reported on 01/19/2022   Suzan Slick, NP Not Taking Active   hydrALAZINE (APRESOLINE) 100 MG tablet [id number] No Take 1 tablet (100 mg total) by mouth 3 (three) times daily.  Patient not taking: Reported on 01/19/2022   Todd Dike, MD Not Taking Active   hydrOXYzine (ATARAX) 25 MG tablet [id number] No TAKE 1 TABLET BY MOUTH AT BEDTIME AS NEEDED  Patient not taking: Reported on 01/19/2022   [name], MD Not Taking Active     Discontinued 05/13/20 1120  insulin glargine (LANTUS) 100 UNIT/ML Solostar Pen [id number] Yes Inject 10 Units into the skin at bedtime. Todd Dike, MD Taking Active   isosorbide mononitrate (IMDUR) 60 MG 24 hr tablet [id number] No Take 1 tablet (60 mg total) by mouth daily.  Patient not taking: Reported on 01/19/2022   Todd Margarita, MD Not Taking Active     Discontinued 12/22/20 1205 (Stop Taking at Discharge)     Discontinued 12/22/20 1205 (Stop Taking at Discharge)   Naphazoline HCl (CLEAR EYES OP) [id number] Yes Place  1 drop into both eyes as needed (irritation). [provider] Taking Active Self  polyethylene glycol powder (GLYCOLAX/MIRALAX) 17 GM/SCOOP powder [id number] Yes Take 17 g by mouth 2 (two) times daily as needed. [name], MD Taking Active Self            Patient Active Problem List   Diagnosis Date Noted   Chronic combined systolic (congestive) and diastolic (congestive) heart failure ([place]) 04/07/2021   Chronic renal disease, stage 4, severely decreased glomerular filtration rate between 15-29 mL/min/1.73 square meter (HCC)    Hypertensive urgency    AKI (acute kidney injury) ([place])    Acute CHF (congestive heart failure) ([place]) 02/27/2021   Normocytic anemia 02/27/2021   Anasarca 02/27/2021   Abscess of left foot 12/10/2020   S/P BKA (below knee amputation) unilateral, left ([place]) 12/10/2020   Dyslipidemia 12/10/2020   CKD (chronic kidney disease), stage III ([place]) 12/10/2020   Subacute osteomyelitis, left ankle and foot (HCC)    Osteomyelitis of fifth toe of left foot (HCC)    Severe protein-calorie malnutrition ([place])    HTN (hypertension) 11/12/2020   Osteomyelitis of second toe of right foot ([place])    Cellulitis and abscess of foot    Toe osteomyelitis ([place]) 03/24/2020   Necrotizing fasciitis ([place]) 09/08/2014   Diabetes mellitus with hyperglycemia ([place]) 09/07/2014   Tobacco abuse 09/07/2014   Abscess of back    Abscess of lower back 09/06/2014   DM2 (diabetes mellitus, type 2) ([place]) 09/06/2014   Sepsis ([place]) 09/06/2014   Scrotal abscess 06/20/2014    Conditions to be addressed/monitored per PCP order:  CHF, HTN, and DMII  Care Plan : RN Care Manager Plan of Care  Updates made by Melissa Montane, RN since 01/19/2022 12:00 AM     Problem: Chronic Disease Management and Care Coordination Needs for CHF, HTN, DM   Priority: High     Long-Range Goal: Development of Plan of Care for Chronic Disease Management and Care Coordination Needs (CHF, HTN, DM)   Start  Date: 09/30/2021  Expected End Date: 04/02/2022  Priority: High  Note:   Current Barriers:  Knowledge Deficits related to plan of care for management of CHF, HTN, and DMII  Chronic Disease Management support and education needs related to CHF, HTN, and DMII Difficulty obtaining medications Recent Vision 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  RNCM Clinical Goal(s):  Patient will verbalize understanding of plan for management of CHF, HTN, and DMII as evidenced by improvement in management of these chronic diseases. verbalize basic understanding of CHF, HTN, and DMII disease process and self health management plan as evidenced by improved control of factors that cause CHF, improved blood pressure readings < 140/90 and improved control of blood sugar readings with range with 80 - 120. take all medications exactly as prescribed and will call provider for medication related questions as evidenced by compliance with medications3/31/    attend  all scheduled medical appointments: No future appointments scheduled as evidenced by attending all scheduled appointments        demonstrate improved adherence to prescribed treatment plan for CHF, HTN, and DMII as evidenced by better control of blood pressure, blood sugars and factors impacting CHF. continue to work with Consulting civil engineer and/or Social Worker to address care management and care coordination needs related to CHF, HTN, and DMII as evidenced by adherence to CM Team Scheduled appointments     through collaboration with Consulting civil engineer, provider, and care team.   Interventions: Inter-disciplinary care team collaboration (see longitudinal plan of care) Evaluation of current treatment plan related to  self management and patient's adherence to plan as established by provider   Heart Failure Interventions:  (Status: Goal on track: NO.)  Long Term Goal  Wt Readings from Last 3 Encounters:  01/09/22 190 lb (86.2  kg)  04/28/21 180 lb (81.6 kg)  04/23/21 180 lb (81.6 kg)    Reviewed Heart Failure Action Plan in depth and provided written copy Discussed the importance of keeping all appointments with provider Assisted patient rescheduling missed Cardiology appointment-[facility] 01/27/22 @ 11am Reviewed medications-requested needed medication refills Advised patient to take all medications and BP readings to visit on 01/27/22 Discussed compliance packaging benefits, offered to assist patient with switching to a pharmacy offering compliance packaging-declines at this time, will wait until after Cardiology appointment  Diabetes:  (Status: Goal on Track (progressing): YES.) Long Term Goal  Lab Results  Component Value Date   HGBA1C 7.2 (H) 02/27/2021    Assessed patient's understanding of A1c goal: <7% Reviewed medications with patient and discussed importance of medication adherence;        Discussed plans with patient for ongoing care management follow up and provided patient with direct contact information for care management team;      Reviewed scheduled/upcoming provider appointments including: PCP 02/24/22;         Advised patient, providing education and rationale, to check cbg once daily and record        call provider for findings outside established parameters;       Patient checking blood sugar once a day in the evening.  Reports blood sugars range between 110-140      Hypertension: (Status: Goal on track: NO.) Long Term Goal  Last practice recorded BP readings:  BP Readings from Last 3 Encounters:  01/10/22 (!) 197/120  01/09/22 (!) 205/126  08/18/21 (!) 160/89  Most recent eGFR/CrCl:  Lab Results  Component Value Date   EGFR 31 (L) 05/21/2021    No components found for: "CRCL"  Evaluation of current treatment plan related to hypertension self management and patient's adherence to plan as established by provider;   Reviewed prescribed diet managing HTN Reviewed medications with  patient and discussed importance of compliance;  Discussed plans with patient for ongoing care management follow up and provided patient with direct contact information for care management team; Advised patient, providing education and rationale, to monitor blood pressure daily and record, calling PCP for findings outside established parameters;  Reviewed scheduled/upcoming provider appointments including:  Assist patient with rescheduling missed Cardiology appointment from March 2023-new appointment with [facility] 01/27/22 @ 11am .  Patient Goals/Self-Care Activities: Take medications as prescribed   Attend all scheduled provider appointments Call pharmacy for medication refills 3-7 days in advance of running out of medications Call provider office for new concerns or questions  check blood sugar at prescribed times: once daily  check blood pressure daily limit salt intake to 2000 mg/day       Follow Up:  Patient agrees to Care Plan and Follow-up.  Plan: The Managed Medicaid care management team will reach out to the patient again over the next 14 days.  Date/time of next scheduled RN care management/care coordination outreach:  02/04/22 @ 11:15am  Lurena Joiner RN, [name]   [employer] manager

## 2022-01-23 ENCOUNTER — Other Ambulatory Visit: Payer: Self-pay

## 2022-01-23 ENCOUNTER — Inpatient Hospital Stay (HOSPITAL_COMMUNITY): Payer: Medicaid Other

## 2022-01-23 ENCOUNTER — Emergency Department (HOSPITAL_COMMUNITY): Payer: Medicaid Other

## 2022-01-23 ENCOUNTER — Encounter (HOSPITAL_COMMUNITY): Payer: Self-pay | Admitting: Emergency Medicine

## 2022-01-23 ENCOUNTER — Inpatient Hospital Stay (HOSPITAL_COMMUNITY)
Admission: EM | Admit: 2022-01-23 | Discharge: 2022-01-26 | DRG: 291 | Disposition: A | Discharge status: Home / Self Care | Payer: Medicaid Other | Attending: Internal Medicine | Admitting: Internal Medicine

## 2022-01-23 DIAGNOSIS — N189 Chronic kidney disease, unspecified: Secondary | ICD-10-CM | POA: Diagnosis not present

## 2022-01-23 DIAGNOSIS — Z720 Tobacco use: Secondary | ICD-10-CM | POA: Diagnosis present

## 2022-01-23 DIAGNOSIS — N289 Disorder of kidney and ureter, unspecified: Secondary | ICD-10-CM

## 2022-01-23 DIAGNOSIS — E785 Hyperlipidemia, unspecified: Secondary | ICD-10-CM | POA: Diagnosis not present

## 2022-01-23 DIAGNOSIS — I5023 Acute on chronic systolic (congestive) heart failure: Secondary | ICD-10-CM | POA: Diagnosis not present

## 2022-01-23 DIAGNOSIS — Z7984 Long term (current) use of oral hypoglycemic drugs: Secondary | ICD-10-CM | POA: Diagnosis not present

## 2022-01-23 DIAGNOSIS — I509 Heart failure, unspecified: Principal | ICD-10-CM

## 2022-01-23 DIAGNOSIS — R778 Other specified abnormalities of plasma proteins: Secondary | ICD-10-CM | POA: Diagnosis present

## 2022-01-23 DIAGNOSIS — E1142 Type 2 diabetes mellitus with diabetic polyneuropathy: Secondary | ICD-10-CM | POA: Diagnosis not present

## 2022-01-23 DIAGNOSIS — I110 Hypertensive heart disease with heart failure: Secondary | ICD-10-CM | POA: Diagnosis not present

## 2022-01-23 DIAGNOSIS — I161 Hypertensive emergency: Secondary | ICD-10-CM | POA: Diagnosis not present

## 2022-01-23 DIAGNOSIS — N186 End stage renal disease: Secondary | ICD-10-CM | POA: Diagnosis present

## 2022-01-23 DIAGNOSIS — E1165 Type 2 diabetes mellitus with hyperglycemia: Secondary | ICD-10-CM | POA: Diagnosis not present

## 2022-01-23 DIAGNOSIS — D631 Anemia in chronic kidney disease: Secondary | ICD-10-CM | POA: Diagnosis present

## 2022-01-23 DIAGNOSIS — N179 Acute kidney failure, unspecified: Secondary | ICD-10-CM | POA: Diagnosis present

## 2022-01-23 DIAGNOSIS — Z794 Long term (current) use of insulin: Secondary | ICD-10-CM

## 2022-01-23 DIAGNOSIS — J90 Pleural effusion, not elsewhere classified: Secondary | ICD-10-CM | POA: Diagnosis not present

## 2022-01-23 DIAGNOSIS — I132 Hypertensive heart and chronic kidney disease with heart failure and with stage 5 chronic kidney disease, or end stage renal disease: Secondary | ICD-10-CM | POA: Diagnosis not present

## 2022-01-23 DIAGNOSIS — R9431 Abnormal electrocardiogram [ECG] [EKG]: Secondary | ICD-10-CM | POA: Diagnosis present

## 2022-01-23 DIAGNOSIS — Z79899 Other long term (current) drug therapy: Secondary | ICD-10-CM | POA: Diagnosis not present

## 2022-01-23 DIAGNOSIS — R0602 Shortness of breath: Secondary | ICD-10-CM | POA: Diagnosis not present

## 2022-01-23 DIAGNOSIS — N049 Nephrotic syndrome with unspecified morphologic changes: Secondary | ICD-10-CM

## 2022-01-23 DIAGNOSIS — R079 Chest pain, unspecified: Secondary | ICD-10-CM | POA: Diagnosis present

## 2022-01-23 DIAGNOSIS — E1122 Type 2 diabetes mellitus with diabetic chronic kidney disease: Secondary | ICD-10-CM | POA: Diagnosis present

## 2022-01-23 DIAGNOSIS — Z833 Family history of diabetes mellitus: Secondary | ICD-10-CM

## 2022-01-23 DIAGNOSIS — Z89512 Acquired absence of left leg below knee: Secondary | ICD-10-CM

## 2022-01-23 DIAGNOSIS — J9811 Atelectasis: Secondary | ICD-10-CM | POA: Diagnosis not present

## 2022-01-23 DIAGNOSIS — N184 Chronic kidney disease, stage 4 (severe): Secondary | ICD-10-CM

## 2022-01-23 DIAGNOSIS — E871 Hypo-osmolality and hyponatremia: Secondary | ICD-10-CM | POA: Diagnosis present

## 2022-01-23 DIAGNOSIS — I5043 Acute on chronic combined systolic (congestive) and diastolic (congestive) heart failure: Secondary | ICD-10-CM | POA: Diagnosis present

## 2022-01-23 DIAGNOSIS — H5462 Unqualified visual loss, left eye, normal vision right eye: Secondary | ICD-10-CM | POA: Diagnosis present

## 2022-01-23 DIAGNOSIS — F17210 Nicotine dependence, cigarettes, uncomplicated: Secondary | ICD-10-CM | POA: Diagnosis not present

## 2022-01-23 LAB — CBC WITH DIFFERENTIAL/PLATELET
Abs Immature Granulocytes: 0.02 10*3/uL (ref 0.00–0.07)
Basophils Absolute: 0 10*3/uL (ref 0.0–0.1)
Basophils Relative: 0 %
Eosinophils Absolute: 0.1 10*3/uL (ref 0.0–0.5)
Eosinophils Relative: 2 %
HCT: 40.4 % (ref 39.0–52.0)
Hemoglobin: 12.2 g/dL — ABNORMAL LOW (ref 13.0–17.0)
Immature Granulocytes: 0 %
Lymphocytes Relative: 19 %
Lymphs Abs: 1 10*3/uL (ref 0.7–4.0)
MCH: 27.4 pg (ref 26.0–34.0)
MCHC: 30.2 g/dL (ref 30.0–36.0)
MCV: 90.6 fL (ref 80.0–100.0)
Monocytes Absolute: 0.4 10*3/uL (ref 0.1–1.0)
Monocytes Relative: 7 %
Neutro Abs: 3.7 10*3/uL (ref 1.7–7.7)
Neutrophils Relative %: 72 %
Platelets: 166 10*3/uL (ref 150–400)
RBC: 4.46 MIL/uL (ref 4.22–5.81)
RDW: 15.4 % (ref 11.5–15.5)
WBC: 5.2 10*3/uL (ref 4.0–10.5)
nRBC: 0 % (ref 0.0–0.2)

## 2022-01-23 LAB — TROPONIN I (HIGH SENSITIVITY)
Troponin I (High Sensitivity): 250 ng/L (ref <18)
Troponin I (High Sensitivity): 252 ng/L (ref <18)

## 2022-01-23 LAB — CBG MONITORING, ED
Glucose-Capillary: 75 mg/dL (ref 70–99)
Glucose-Capillary: 77 mg/dL (ref 70–99)
Glucose-Capillary: 96 mg/dL (ref 70–99)

## 2022-01-23 LAB — BASIC METABOLIC PANEL
Anion gap: 6 (ref 5–15)
BUN: 38 mg/dL — ABNORMAL HIGH (ref 6–20)
CO2: 23 mmol/L (ref 22–32)
Calcium: 7.5 mg/dL — ABNORMAL LOW (ref 8.9–10.3)
Chloride: 105 mmol/L (ref 98–111)
Creatinine, Ser: 4.1 mg/dL — ABNORMAL HIGH (ref 0.61–1.24)
GFR, Estimated: 17 mL/min — ABNORMAL LOW (ref >60)
Glucose, Bld: 151 mg/dL — ABNORMAL HIGH (ref 70–99)
Potassium: 4.2 mmol/L (ref 3.5–5.1)
Sodium: 134 mmol/L — ABNORMAL LOW (ref 135–145)

## 2022-01-23 LAB — GLUCOSE, CAPILLARY: Glucose-Capillary: 101 mg/dL — ABNORMAL HIGH (ref 70–99)

## 2022-01-23 LAB — ECHOCARDIOGRAM COMPLETE
Area-P 1/2: 4.8 cm2
S' Lateral: 3.4 cm

## 2022-01-23 LAB — BRAIN NATRIURETIC PEPTIDE: B Natriuretic Peptide: 2005.4 pg/mL — ABNORMAL HIGH (ref 0.0–100.0)

## 2022-01-23 MED ORDER — AMLODIPINE BESYLATE 10 MG PO TABS
10.0000 mg | ORAL_TABLET | Freq: Every day | ORAL | Status: DC
  Administered 2022-01-23 – 2022-01-26 (×4): 10 mg via ORAL
  Filled 2022-01-23: qty 2
  Filled 2022-01-23 (×3): qty 1

## 2022-01-23 MED ORDER — ALBUTEROL SULFATE (2.5 MG/3ML) 0.083% IN NEBU: 2.5000 mg | INHALATION_SOLUTION | Freq: Four times a day (QID) | RESPIRATORY_TRACT | Status: DC | PRN

## 2022-01-23 MED ORDER — HEPARIN BOLUS VIA INFUSION
4000.0000 [IU] | Freq: Once | INTRAVENOUS | Status: AC
  Administered 2022-01-23: 4000 [IU] via INTRAVENOUS
  Filled 2022-01-23: qty 4000

## 2022-01-23 MED ORDER — ACETAMINOPHEN 650 MG RE SUPP: 650.0000 mg | Freq: Four times a day (QID) | RECTAL | Status: DC | PRN

## 2022-01-23 MED ORDER — ACETAMINOPHEN 325 MG PO TABS
650.0000 mg | ORAL_TABLET | Freq: Four times a day (QID) | ORAL | Status: DC | PRN
  Administered 2022-01-24: 650 mg via ORAL
  Filled 2022-01-23: qty 2

## 2022-01-23 MED ORDER — ASPIRIN 81 MG PO CHEW
324.0000 mg | CHEWABLE_TABLET | ORAL | Status: AC
Start: 2022-01-23 — End: 2022-01-23
  Administered 2022-01-23: 324 mg via ORAL
  Filled 2022-01-23: qty 4

## 2022-01-23 MED ORDER — HEPARIN (PORCINE) 25000 UT/250ML-% IV SOLN
1800.0000 [IU]/h | INTRAVENOUS | Status: DC
  Administered 2022-01-23: 1100 [IU]/h via INTRAVENOUS
  Administered 2022-01-24: 1400 [IU]/h via INTRAVENOUS
  Filled 2022-01-23 (×2): qty 250

## 2022-01-23 MED ORDER — POLYETHYLENE GLYCOL 3350 17 G PO PACK
17.0000 g | PACK | Freq: Two times a day (BID) | ORAL | Status: DC | PRN
  Administered 2022-01-26: 17 g via ORAL
  Filled 2022-01-23: qty 1

## 2022-01-23 MED ORDER — FERROUS SULFATE 325 (65 FE) MG PO TABS
325.0000 mg | ORAL_TABLET | Freq: Every day | ORAL | Status: DC
  Administered 2022-01-24 – 2022-01-26 (×3): 325 mg via ORAL
  Filled 2022-01-23 (×3): qty 1

## 2022-01-23 MED ORDER — ATORVASTATIN CALCIUM 10 MG PO TABS
20.0000 mg | ORAL_TABLET | Freq: Every day | ORAL | Status: DC
  Administered 2022-01-23 – 2022-01-26 (×4): 20 mg via ORAL
  Filled 2022-01-23 (×4): qty 2

## 2022-01-23 MED ORDER — PREGABALIN 25 MG PO CAPS
50.0000 mg | ORAL_CAPSULE | Freq: Two times a day (BID) | ORAL | Status: DC
  Administered 2022-01-23 – 2022-01-26 (×6): 50 mg via ORAL
  Filled 2022-01-23 (×6): qty 2

## 2022-01-23 MED ORDER — ACETAMINOPHEN-CODEINE 300-30 MG PO TABS
1.0000 | ORAL_TABLET | Freq: Two times a day (BID) | ORAL | Status: DC | PRN
  Administered 2022-01-25 (×2): 1 via ORAL
  Filled 2022-01-23 (×2): qty 1

## 2022-01-23 MED ORDER — FUROSEMIDE 10 MG/ML IJ SOLN
40.0000 mg | Freq: Two times a day (BID) | INTRAMUSCULAR | Status: DC
  Administered 2022-01-23 – 2022-01-24 (×2): 40 mg via INTRAVENOUS
  Filled 2022-01-23 (×2): qty 4

## 2022-01-23 MED ORDER — ISOSORBIDE MONONITRATE ER 60 MG PO TB24
60.0000 mg | ORAL_TABLET | Freq: Every day | ORAL | Status: DC
  Administered 2022-01-23 – 2022-01-26 (×4): 60 mg via ORAL
  Filled 2022-01-23 (×2): qty 1
  Filled 2022-01-23: qty 2
  Filled 2022-01-23: qty 1

## 2022-01-23 MED ORDER — INSULIN ASPART 100 UNIT/ML IJ SOLN
0.0000 [IU] | Freq: Three times a day (TID) | INTRAMUSCULAR | Status: DC
  Administered 2022-01-26: 2 [IU] via SUBCUTANEOUS

## 2022-01-23 MED ORDER — FUROSEMIDE 10 MG/ML IJ SOLN
80.0000 mg | Freq: Once | INTRAMUSCULAR | Status: AC
  Administered 2022-01-23: 80 mg via INTRAVENOUS
  Filled 2022-01-23: qty 8

## 2022-01-23 MED ORDER — INSULIN GLARGINE-YFGN 100 UNIT/ML ~~LOC~~ SOLN
5.0000 [IU] | Freq: Every day | SUBCUTANEOUS | Status: DC
Start: 2022-01-23 — End: 2022-01-26
  Administered 2022-01-24 – 2022-01-25 (×3): 5 [IU] via SUBCUTANEOUS
  Filled 2022-01-23 (×6): qty 0.05

## 2022-01-23 MED ORDER — FUROSEMIDE 10 MG/ML IJ SOLN
40.0000 mg | Freq: Once | INTRAMUSCULAR | Status: DC
  Filled 2022-01-23: qty 4

## 2022-01-23 MED ORDER — CARVEDILOL 25 MG PO TABS
25.0000 mg | ORAL_TABLET | Freq: Two times a day (BID) | ORAL | Status: DC
  Administered 2022-01-23 – 2022-01-26 (×7): 25 mg via ORAL
  Filled 2022-01-23 (×2): qty 1
  Filled 2022-01-23: qty 2
  Filled 2022-01-23 (×4): qty 1

## 2022-01-23 MED ORDER — SODIUM CHLORIDE 0.9% FLUSH
3.0000 mL | Freq: Two times a day (BID) | INTRAVENOUS | Status: DC
  Administered 2022-01-23 – 2022-01-26 (×6): 3 mL via INTRAVENOUS

## 2022-01-23 NOTE — Progress Notes (Signed)
  Echocardiogram 2D Echocardiogram has been performed.  Todd Mccoy F 01/23/2022, 3:44 PM

## 2022-01-23 NOTE — ED Notes (Signed)
IV team at bedside, heparin infusion restarted.

## 2022-01-23 NOTE — ED Notes (Signed)
Pt in room A&O x4. Resting in bed. Pt denies pain. Consumed 100% of breakfast. Updated on plan of care. PtS wheelchair at bedside. Pt is a LBKA.

## 2022-01-23 NOTE — Progress Notes (Signed)
ANTICOAGULATION CONSULT NOTE - Initial Consult  Pharmacy Consult for heparin Indication: chest pain/ACS  No Known Allergies  Patient Measurements:    Vital Signs: Temp: 98.3 F (36.8 C) (07/22 0413) Temp Source: Oral (07/22 0413) BP: 169/99 (07/22 0515) Pulse Rate: 104 (07/22 0515)  Labs: Recent Labs    01/23/22 0309  HGB 12.2*  HCT 40.4  PLT 166  CREATININE 4.10*  TROPONINIHS 250*    Estimated Creatinine Clearance: 22.5 mL/min (A) (by C-G formula based on SCr of 4.1 mg/dL (H)).   Medical History: Past Medical History:  Diagnosis Date   Anemia    Anemia    Anxiety    Chronic combined systolic (congestive) and diastolic (congestive) heart failure (HCC)    EF 40-45% with G3DD on echo 2022   CKD (chronic kidney disease), stage IV ([place]) 12/10/2020   Diabetes mellitus with complication (HCC)    Diabetic neuropathy (HCC)    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[place]) 03/25/2020   pt states retinal eye damage- left worse than the right- recent visited MD    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[place]) 03/25/2020   pt states recent MD visit /left eye worse than right   Dyslipidemia 12/10/2020   Hx of BKA, left ([place])    Hypertension    Morbid obesity ([place])    Osteomyelitis (HCC)    Pneumonia    Assessment: 51 yo male presenting with chest pain. No anticoagulation reported prior to admission. Troponin 250, Hgb 12.2, PLT WNL  Goal of Therapy:  Heparin level 0.3-0.7 units/ml Monitor platelets by anticoagulation protocol: Yes   Plan:  Give 4000 units bolus x 1 Start heparin infusion at 1100 units/hr Check anti-Xa level in 8 hours and daily while on heparin Continue to monitor H&H and platelets  Janashia Parco L Makaley Storts 01/23/2022,6:53 AM

## 2022-01-23 NOTE — Consult Note (Cosign Needed Addendum)
Cardiology Consultation:   Patient ID: FOUNTAIN DERUSHA MRN: [id number]; DOB: 1971/04/16  Admit date: 01/23/2022 Date of Consult: 01/23/2022  PCP:  [name], MD   [facility] Providers Cardiologist:  Fransico Him, MD        Patient Profile:   Todd Mccoy is a 51 y.o. 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  He is on disability and Medicaid.  Patient was previously admitted in August 2022 was significant scrotal edema and lower extremity edema for several months.  Blood pressure was 200 at the time.  BNP greater than 4500.  Creatinine 2.5.  Albumin low at 2.0.  He underwent IV diuresis managed by nephrology service and assisted by IV albumin for possible nephrotic syndrome.  Echocardiogram obtained on 02/27/2021 showed EF 40%, moderate to severe LVH, grade 3 DD, moderately reduced RV EF, RVSP 58.7 mmHg, moderate LAE, small pericardial effusion with no evidence of tamponade, trivial MR, moderate TR.  Renal artery ultrasound was negative for significant stenosis.  He was on 120 mg of IV Lasix every 8 hours with 5 mg twice daily of metolazone for diuresis during the hospitalization.  He did have chest discomfort, however it was atypical therefore deferred additional evaluation as outpatient.  Patient  was ultimately discharged on carvedilol 25 mg twice a day, hydralazine 100 mg 3 times daily, Imdur 60 mg daily and Lasix.  He was seen by Dr. Alfonzo Feller on 04/07/2021 at which time he was on 160 mg oral Lasix 3 times a day and 5 mg twice a day of metolazone.  Nuclear stress test obtained on 04/23/2021 showed EF 43%, negative stress induced arrhythmia, negative for ischemia or infarction.  Patient was also referred for sleep study to rule out significant obstructive sleep apnea as well.  More recently, patient presented to the ED on 01/09/2022 with bilateral lower extremity edema after running out of medication at home.  Blood pressure was 205/126.  Blood work obtained at the time showed creatinine jumped up to 3.8 from the previous 2.5 range.  Unfortunately patient was left without being seen.  He has since picked up the prescription roughly 2 days ago and has been taking it.  He was in his usual state state of health until he around 11:30 PM last night when he started having left-sided chest pain.  He says that his symptoms occurred shortly after he took his Tylenol 3 and Lyrica.  Blood work was significant for creatinine 4.1, BNP 2005, high-sensitivity troponin 250--252.  Chest x-ray showed cardiomegaly with pulmonary vascular congestion, moderate right-sided pleural effusion.  Patient was felt to be slightly volume overloaded and started on IV Lasix 80 mg.  Lasix was later de-escalated to 40 mg BID.  Patient has also been placed on heparin drip due to elevated troponin.  In total, chest pain lasted roughly 2 hours before going away.  He described  a single continuous chest pain during those 2 hours.     Past Medical History:  Diagnosis Date   Anemia    Anemia    Anxiety    Chronic combined systolic (congestive) and diastolic (congestive) heart failure (HCC)    EF 40-45% with G3DD on echo 2022   CKD (chronic kidney disease), stage IV ([place]) 12/10/2020   Diabetes mellitus with complication (HCC)     Diabetic neuropathy ([place])    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[place]) 03/25/2020   pt states retinal eye damage- left worse than the right- recent visited MD    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[place]) 03/25/2020   pt states recent MD visit /left eye worse than right   Dyslipidemia 12/10/2020   Hx of BKA, left ([place])    Hypertension    Morbid obesity ([place])    Osteomyelitis ([place])    Pneumonia     Past Surgical History:  Procedure Laterality Date   ABSCESS DRAINAGE     neck   AMPUTATION Left 11/14/2020   Procedure: LEFT 5TH RAY AMPUTATION;  Surgeon: Newt Minion, MD;  Location: [place];  Service: Orthopedics;  Laterality: Left;   AMPUTATION Left 12/10/2020   Procedure: LEFT BELOW KNEE AMPUTATION;  Surgeon: Newt Minion, MD;  Location: [place];  Service: Orthopedics;  Laterality: Left;   AMPUTATION TOE Right 03/25/2020   Procedure: AMPUTATION TOE;  Surgeon: Evelina Bucy, DPM;  Location: [facility];  Service: Podiatry;  Laterality: Right;   INCISION AND DRAINAGE ABSCESS Right 09/07/2014   Procedure: INCISION AND DRAINAGE ABSCESS RIGHT FLANK;  Surgeon: Jackolyn Confer, MD;  Location: [facility];  Service: General;  Laterality: Right;   LEG AMPUTATION BELOW KNEE Left 12/10/2020   SCROTUM EXPLORATION       Home Medications:  Prior to Admission medications   Medication Sig Start Date End Date Taking? Authorizing Provider  acetaminophen (TYLENOL) 325 MG tablet Take 650 mg by mouth every 6 (six) hours as needed for mild pain.   Yes [provider]  acetaminophen-codeine (TYLENOL #3) 300-30 MG tablet Take 1 tablet by mouth every 12 (twelve) hours as needed for moderate pain. 01/19/22  Yes Dondra Prader R, NP  amLODipine (NORVASC) 10 MG tablet Take 1 tablet (10 mg total) by mouth daily. 01/19/22  Yes Turner, Eber Hong, MD  atorvastatin (LIPITOR) 20 MG tablet Take 1 tablet (20 mg total) by mouth daily. 04/23/21  Yes Turner, Eber Hong, MD  carvedilol (COREG) 25 MG tablet Take 1 tablet (25 mg total)  by mouth 2 (two) times daily. 05/21/21  Yes Turner, Eber Hong, MD  ferrous sulfate 325 (65 FE) MG tablet Take 1 tablet (325 mg total) by mouth 3 (three) times daily with meals. Patient taking differently: Take 325 mg by mouth daily with breakfast. 12/19/20 01/23/22 Yes Shahmehdi, Valeria Batman, MD  furosemide (LASIX) 80 MG tablet Take 1 tablet (80 mg total) by mouth daily. 01/19/22  Yes Turner, Eber Hong, MD  insulin glargine (LANTUS) 100 UNIT/ML Solostar Pen Inject 10 Units into the skin at bedtime. 03/06/21 03/06/22 Yes Kathie Dike, MD  isosorbide mononitrate (IMDUR) 60 MG 24 hr tablet Take 1 tablet (60 mg total) by mouth daily. 01/19/22  Yes Turner, Eber Hong, MD  metolazone (ZAROXOLYN) 5 MG tablet Take 5 mg by mouth 2 (two) times daily. 01/08/22  Yes [provider]  Naphazoline HCl (CLEAR EYES OP) Place 1 drop into both eyes as needed (irritation).   Yes [provider]  polyethylene glycol powder (GLYCOLAX/MIRALAX) 17 GM/SCOOP powder Take 17 g by mouth 2 (two) times daily as needed. Patient taking differently: Take 17 g by mouth 2 (two) times daily as needed for mild constipation. 01/21/21  Yes [name], MD  pregabalin (LYRICA) 50 MG capsule Take 50 mg by mouth 2 (two) times daily. 11/08/21  Yes [provider]  insulin aspart protamine- aspart (NOVOLOG MIX 70/30) (70-30) 100 UNIT/ML injection Inject 0.35 mLs (35 Units total) into the skin 2 (two) times daily. 04/17/20 05/13/20  Argentina Donovan, PA-C  losartan-hydrochlorothiazide (HYZAAR) 100-25 MG tablet TAKE 1 TABLET BY MOUTH DAILY. 12/19/20 12/22/20  Shahmehdi, Valeria Batman, MD  metFORMIN (GLUCOPHAGE-XR) 500 MG 24 hr tablet TAKE 1 TABLET (500 MG TOTAL) BY MOUTH 2 (TWO) TIMES DAILY. 12/19/20 12/22/20  Deatra James, MD    Inpatient Medications: Scheduled Meds:  amLODipine  10 mg Oral Daily   atorvastatin  20 mg Oral Daily   carvedilol  25 mg Oral BID   [START ON 01/24/2022] ferrous sulfate  325 mg Oral Q breakfast   furosemide   40 mg Intravenous BID   insulin aspart  0-6 Units Subcutaneous TID WC   insulin glargine-yfgn  5 Units Subcutaneous QHS   isosorbide mononitrate  60 mg Oral Daily   pregabalin  50 mg Oral BID   sodium chloride flush  3 mL Intravenous Q12H   Continuous Infusions:  heparin 1,100 Units/hr (01/23/22 0652)   PRN Meds: acetaminophen **OR** acetaminophen, acetaminophen-codeine, albuterol, polyethylene glycol  Allergies:   No Known Allergies  Social History:   Social History   Socioeconomic History   Marital status: Single    Spouse name: Not on file   Number of children: 1   Years of education: Not on file   Highest education level: Not on file  Occupational History   Not on file  Tobacco Use   Smoking status: Some Days    Packs/day: 1.00    Years: 32.00    Total pack years: 32.00    Types: Cigarettes    Last attempt to quit: 08/24/2021    Years since quitting: 0.4    Passive exposure: Past   Smokeless tobacco: Former   Tobacco comments:    Quit smoking previously February 2023  Vaping Use   Vaping Use: Never used  Substance and Sexual Activity   Alcohol use: No   Drug use: No   Sexual activity: Not on file  Other Topics Concern   Not on file  Social History Narrative   Not on file   Social Determinants of Health   Financial Resource Strain: Low Risk  (09/30/2021)   Overall Financial Resource Strain (CARDIA)    Difficulty of Paying Living Expenses: Not hard at all  Food Insecurity: No Food Insecurity (09/30/2021)   Hunger Vital Sign    Worried About Running Out of Food in the Last Year: Never true    Ran Out of Food in the Last Year: Never true  Transportation Needs: No Transportation Needs (09/30/2021)   PRAPARE - Hydrologist (Medical): No    Lack of Transportation (Non-Medical): No  Physical Activity: Inactive (09/30/2021)   Exercise Vital Sign    Days of Exercise per Week: 0 days    Minutes of Exercise per Session: 0 min  Stress:  No Stress Concern Present (09/30/2021)   [place]    Feeling of Stress : Only  a little  Social Connections: Moderately Isolated (09/30/2021)   Social Connection and Isolation Panel [NHANES]    Frequency of Communication with Friends and Family: More than three times a week    Frequency of Social Gatherings with Friends and Family: More than three times a week    Attends Religious Services: 1 to 4 times per year    Active Member of [employer] or Organizations: No    Attends Music therapist: Never    Marital Status: Never married  Human resources officer Violence: Not on file    Family History:    Family History  Problem Relation Age of Onset   Diabetes Mother    Diabetes Father    Diabetes Brother    Colon cancer Neg Hx    Esophageal cancer Neg Hx    Pancreatic cancer Neg Hx    Stomach cancer Neg Hx    Liver disease Neg Hx    CAD Neg Hx      ROS:  Please see the history of present illness.   All other ROS reviewed and negative.     Physical Exam/Data:   Vitals:   01/23/22 0800 01/23/22 0943 01/23/22 0945 01/23/22 1215  BP: (!) 187/124  (!) 181/115 (!) 152/101  Pulse: (!) 116  99 84  Resp: 14  20 14   Temp:  97.8 F (36.6 C)    TempSrc:      SpO2: 96%  100% 99%   No intake or output data in the 24 hours ending 01/23/22 1302    01/09/2022    1:22 PM 04/28/2021   10:25 AM 04/23/2021   10:43 AM  Last 3 Weights  Weight (lbs) 190 lb 180 lb 180 lb  Weight (kg) 86.183 kg 81.647 kg 81.647 kg     There is no height or weight on file to calculate BMI.  General:  Well nourished, well developed, in no acute distress HEENT: normal Neck: no JVD Vascular: No carotid bruits; Distal pulses 2+ bilaterally Cardiac:  normal S1, S2; RRR; no murmur  Lungs:  clear to auscultation bilaterally, no wheezing, rhonchi or rales  Abd: soft, nontender, no hepatomegaly  Ext: no edema Musculoskeletal:  L BKA Skin: warm and  dry  Neuro:  CNs 2-12 intact, no focal abnormalities noted Psych:  Normal affect   EKG:  The EKG was personally reviewed and demonstrates:   Normal sinus rhythm, no significant ST-T wave changes Telemetry:  Telemetry was personally reviewed and demonstrates: Normal sinus rhythm, no significant ventricular ectopy  Relevant CV Studies:  Echo 02/27/2021 1. Left ventricular ejection fraction, by estimation, is 40%. The left  ventricle has moderately decreased function. The left ventricle  demonstrates global hypokinesis. There is moderate-severe left ventricular  hypertrophy. Left ventricular diastolic  parameters are consistent with Grade III diastolic dysfunction  (restrictive).   2. Right ventricular systolic function is moderately reduced. The right  ventricular size is mildly enlarged. Moderately increased right  ventricular wall thickness. There is moderately elevated pulmonary artery  systolic pressure. The estimated right  ventricular systolic pressure is 13.0 mmHg.   3. Left atrial size was moderately dilated.   4. A small pericardial effusion is present. The pericardial effusion is  circumferential. There is no evidence of cardiac tamponade. Large pleural  effusion.   5. The mitral valve is grossly normal. Trivial mitral valve  regurgitation. No evidence of mitral stenosis.   6. Tricuspid valve regurgitation is moderate.   7. The aortic valve is tricuspid. Aortic valve  regurgitation is not  visualized. No aortic stenosis is present.   8. The inferior vena cava is normal in size with <50% respiratory  variability, suggesting right atrial pressure of 8 mmHg.   Laboratory Data:  High Sensitivity Troponin:   Recent Labs  Lab 01/23/22 0309 01/23/22 0622  TROPONINIHS 250* 252*     Chemistry Recent Labs  Lab 01/23/22 0309  NA 134*  K 4.2  CL 105  CO2 23  GLUCOSE 151*  BUN 38*  CREATININE 4.10*  CALCIUM 7.5*  GFRNONAA 17*  ANIONGAP 6    No results for input(s):  "PROT", "ALBUMIN", "AST", "ALT", "ALKPHOS", "BILITOT" in the last 168 hours. Lipids No results for input(s): "CHOL", "TRIG", "HDL", "LABVLDL", "LDLCALC", "CHOLHDL" in the last 168 hours.  Hematology Recent Labs  Lab 01/23/22 0309  WBC 5.2  RBC 4.46  HGB 12.2*  HCT 40.4  MCV 90.6  MCH 27.4  MCHC 30.2  RDW 15.4  PLT 166   Thyroid No results for input(s): "TSH", "FREET4" in the last 168 hours.  BNP Recent Labs  Lab 01/23/22 0309  BNP 2,005.4*    DDimer No results for input(s): "DDIMER" in the last 168 hours.   Radiology/Studies:  DG Chest 2 View  Result Date: 01/23/2022 CLINICAL DATA:  Chest pain, shortness of breath. EXAM: CHEST - 2 VIEW COMPARISON:  02/27/2021. FINDINGS: Heart is enlarged and the mediastinal contour is within normal limits. The pulmonary vasculature is distended. Mild perihilar airspace disease is noted on the right. There is mild atelectasis on the left. There is a moderate pleural effusion on the right. No pneumothorax bilaterally. No acute osseous abnormality. IMPRESSION: 1. Cardiomegaly with pulmonary vascular congestion. 2. Mild perihilar airspace disease on the right, possible atelectasis, edema, or infiltrate. 3. Moderate right pleural effusion. Electronically Signed   By: Brett Fairy M.D.   On: 01/23/2022 03:40     Assessment and Plan:   Chest pain:  -Patient described a single continuous episode of chest pain.  Serial troponin 250--> 252, remained flat despite continuous chest pain for 2 hours.  Troponin elevation likely in the setting of volume overload and AKI with creatinine of 4.   -Flat troponin is inconsistent with ACS pattern.  Given lack of recurrent chest pain and a normal nuclear stress test in October 2022, no further evaluation unless recurrence.  Acute on chronic combined systolic and diastolic heart failure   -He was off of medication for extended period, he has since restarted on the medications.   -Patient was previously admitted for  volume overload in August 2022, IV diuresis was assisted with IV albumin due to nephrotic syndrome.  He was placed on 120 mg 3 times daily dosing of IV Lasix along with 5 mg twice a day of metolazone.  He was ultimately discharged on 160 mg 3 times a day of oral Lasix with 5 mg twice a day of metolazone.  -Based on note by our clinical pharmacist in November 2022, his nephrologist at [facility] stopped his metolazone and the decreased Lasix to 80 mg 3 times daily, however patient was only taking it daily because he has not noticed any swelling.  -No significant lower extremity edema was seen today, although patient felt volume overloaded.  Chest x-ray showed moderate right pleural effusion, cardiomegaly with pulmonary vascular congestion, mild perihilar airspace disease on the right.  He was given 80 mg IV Lasix early this morning followed by 40 mg twice daily starting tonight.  -Obtain echocardiogram.  Consider increase IV Lasix to at least 80 mg twice a day, and follow urine output. (Patient was actually on 120 mg 3 times daily dose of IV Lasix and 5 mg twice daily dose of metolazone during the previous hospitalization.)   Acute on chronic renal insufficiency: Baseline creatinine 2.5 in November, creatinine increased to 3.82 on 01/09/2022, however patient left the ED before being seen.  I arrived at this time with creatinine of 4.1 early this morning.  Hypertensive emergency: Blood pressure elevated.  On home amlodipine 10 mg daily, carvedilol 25 mg twice a day, Imdur 60 mg daily.  Follow blood pressure with diuresis.  Patient previously had improved blood pressure after IV diuresis.  Hyperlipidemia: On Lipitor  DM2  History of left BKA and right second toe amputation  Possible hematuria: hgb improved, 2 jugs of urine by the bedside, 1st jug was clear yellow, second jug pinkish red (grapefruit color). Consider urinalysis   Risk Assessment/Risk Scores:     TIMI Risk Score for  Unstable Angina or Non-ST Elevation MI:   The patient's TIMI risk score is 1, which indicates a 5% risk of all cause mortality, new or recurrent myocardial infarction or need for urgent revascularization in the next 14 days.  New York Heart Association (NYHA) Functional Class NYHA Class III        For questions or updates, please contact [facility] Please consult [email or web address] for contact info under    Hilbert Corrigan, [place]  01/23/2022 1:02 PM  Patient seen and examined with Almyra Deforest PA.  Agree as above, with the following exceptions and changes as noted below. Patient was seen 01/24/22 in consultation. Feeling much better after lasix. He feels back to baseline and is not having any additional chest pain. Feels chest pain is 2/2 to medication use. LE edema resolved. Gen: NAD, CV: RRR, no murmurs, Lungs: clear, Abd: soft, Extrem: Warm, well perfused, no edema, Neuro/Psych: alert and oriented x 3, normal mood and affect. All available labs, radiology testing, previous records reviewed. He feels he is euvolemic and his LE edema has resolved. However, with renal function Cr 4, he will need at least 80 mg IV lasix BID to diurese effectively. May even need higher dose however he feels his symptoms have resolved. Given severity of renal dysfunction, may want to involve nephrology in ongoing and outpatient diuretic recommendations.   Elouise Munroe, MD 01/24/22 11:15 AM

## 2022-01-23 NOTE — ED Provider Triage Note (Addendum)
Emergency Medicine Provider Triage Evaluation Note  Todd Mccoy , a 51 y.o. male  was evaluated in triage.  Pt complains of chest pain that began around midnight.  States left sided, no radiation into the arm.  Does feel a little SOB.  No cough, fever.  He is a smoker. Denies recent drug or alcohol use.  Review of Systems  Positive: Chest pain, SOB Negative: fever  Physical Exam  BP (!) 185/131   Pulse (!) 103   Temp 97.8 F (36.6 C)   Resp 16   SpO2 99%  Gen:   Awake, no distress   Resp:  Normal effort  MSK:   Moves extremities without difficulty  Other:    Medical Decision Making  Medically screening exam initiated at 2:52 AM.  Appropriate orders placed.  Todd Mccoy was informed that the remainder of the evaluation will be completed by another provider, this initial triage assessment does not replace that evaluation, and the importance of remaining in the ED until their evaluation is complete.  Chest pain.  Does have hx of CHF.  Hypertensive in triage 185/131.  EKG, labs, CXR.   Larene Pickett, PA-C 01/23/22 0253    Larene Pickett, PA-C 01/23/22 [phone]

## 2022-01-23 NOTE — H&P (Addendum)
History and Physical    Patient: Todd Mccoy EPP:[id number] DOB: [birth year] DOA: 01/23/2022 DOS: the patient was seen and examined on 01/23/2022 PCP: [name], MD  Patient coming from: Home via EMS  Chief Complaint:  Chief Complaint  Patient presents with   Chest Pain   HPI: Todd Mccoy is a 51 y.o. male with medical history significant of hypertension, dyslipidemia, chronic systolic and diastolic CHF last EF noted to be 40% with grade 3DD, diabetes mellitus type 2, CKD stage IV, and obesity who presents with complaints of left-sided chest pain which started around 12:30 AM this morning.  Patient notes that he was not yet asleep at that time and felt his heart racing.  He describes it as a mild pain, but was constant and did not go away.  He has been dealing with leg swelling and source of his legs due to the swelling.  Patient notes he had been without his Lasix for the last 1-2 months, but was able to fill his prescription 2 days ago and has been taking it.  He has still been able to make urine.  Denies having any fever, shortness of breath, cough, or orthopnea.  He is followed by [facility], but reports that he had not been thought to need dialysis when he was last seen  Upon admission into the emergency department patient was seen to be afebrile, tachycardic, tachypneic, hypertensive with blood pressures elevated up to 204/134, and O2 saturations maintained on room air.  Labs significant for hemoglobin 12.2 sodium 134, BUN 38, creatinine 4.1, BNP 2005.4, and high-sensitivity troponin 250.  Chest x-ray revealed cardiomegaly with pulmonary vascular congestion perihilar airspace disease on the right, and a moderate right-sided pleural effusion.  He had been given full dose aspirin, Lasix 80 mg IV, and started on a heparin drip.     Review of Systems: As mentioned in the history of present illness. All other systems reviewed and are negative. Past Medical  History:  Diagnosis Date   Anemia    Anemia    Anxiety    Chronic combined systolic (congestive) and diastolic (congestive) heart failure (HCC)    EF 40-45% with G3DD on echo 2022   CKD (chronic kidney disease), stage IV ([place]) 12/10/2020   Diabetes mellitus with complication (HCC)    Diabetic neuropathy ([place])    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[place]) 03/25/2020   pt states retinal eye damage- left worse than the right- recent visited MD    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[place]) 03/25/2020   pt states recent MD visit /left eye worse than right   Dyslipidemia 12/10/2020   Hx of BKA, left ([place])    Hypertension    Morbid obesity ([place])    Osteomyelitis ([place])    Pneumonia    Past Surgical History:  Procedure Laterality Date   ABSCESS DRAINAGE     neck   AMPUTATION Left 11/14/2020   Procedure: LEFT 5TH RAY AMPUTATION;  Surgeon: Newt Minion, MD;  Location: [place];  Service: Orthopedics;  Laterality: Left;   AMPUTATION Left 12/10/2020   Procedure: LEFT BELOW KNEE AMPUTATION;  Surgeon: Newt Minion, MD;  Location: [place];  Service: Orthopedics;  Laterality: Left;   AMPUTATION TOE Right 03/25/2020   Procedure: AMPUTATION TOE;  Surgeon: Evelina Bucy, DPM;  Location: [facility];  Service: Podiatry;  Laterality: Right;   INCISION AND DRAINAGE ABSCESS Right 09/07/2014   Procedure: INCISION AND DRAINAGE ABSCESS RIGHT FLANK;  Surgeon: Jackolyn Confer, MD;  Location: [facility];  Service: General;  Laterality: Right;   LEG AMPUTATION BELOW KNEE Left 12/10/2020   SCROTUM EXPLORATION     Social History:  reports that he has been smoking cigarettes. He has a 32.00 pack-year smoking history. He has been exposed to tobacco smoke. He has quit using smokeless tobacco. He reports that he does not drink alcohol and does not use drugs.  No Known Allergies  Family History  Problem Relation Age of Onset   Diabetes Mother    Diabetes Father    Diabetes Brother    Colon cancer Neg Hx    Esophageal  cancer Neg Hx    Pancreatic cancer Neg Hx    Stomach cancer Neg Hx    Liver disease Neg Hx    CAD Neg Hx     Prior to Admission medications   Medication Sig Start Date End Date Taking? Authorizing Provider  hydrOXYzine (ATARAX) 25 MG tablet TAKE 1 TABLET BY MOUTH AT BEDTIME AS NEEDED Patient not taking: Reported on 01/19/2022 10/07/21   [name], MD  acetaminophen (TYLENOL) 325 MG tablet Take 650 mg by mouth every 6 (six) hours as needed for mild pain.    [provider]  acetaminophen-codeine (TYLENOL #3) 300-30 MG tablet Take 1 tablet by mouth every 12 (twelve) hours as needed for moderate pain. 01/19/22   Suzan Slick, NP  amLODipine (NORVASC) 10 MG tablet Take 1 tablet (10 mg total) by mouth daily. 01/19/22   [name], MD  atorvastatin (LIPITOR) 20 MG tablet Take 1 tablet (20 mg total) by mouth daily. 04/23/21   [name], MD  carvedilol (COREG) 25 MG tablet Take 1 tablet (25 mg total) by mouth 2 (two) times daily. 05/21/21   [name], MD  doxycycline (VIBRAMYCIN) 100 MG capsule Take 1 capsule (100 mg total) by mouth 2 (two) times daily. Patient not taking: Reported on 09/30/2021 08/18/21   Rodriguez-Southworth, Sunday Spillers, PA-C  ferrous sulfate 325 (65 FE) MG tablet Take 1 tablet (325 mg total) by mouth 3 (three) times daily with meals. Patient taking differently: Take 325 mg by mouth in the morning and at bedtime. 12/19/20 04/11/21  ShahmehdiValeria Batman, MD  furosemide (LASIX) 80 MG tablet Take 1 tablet (80 mg total) by mouth daily. 01/19/22   [name], MD  gabapentin (NEURONTIN) 300 MG capsule Take 1 capsule (300 mg total) by mouth 3 (three) times daily. Patient not taking: Reported on 01/19/2022 11/03/21   Suzan Slick, NP  hydrALAZINE (APRESOLINE) 100 MG tablet Take 1 tablet (100 mg total) by mouth 3 (three) times daily. Patient not taking: Reported on 01/19/2022 03/06/21   Kathie Dike, MD  insulin glargine (LANTUS) 100 UNIT/ML Solostar Pen Inject 10  Units into the skin at bedtime. 03/06/21 03/06/22  Kathie Dike, MD  isosorbide mononitrate (IMDUR) 60 MG 24 hr tablet Take 1 tablet (60 mg total) by mouth daily. 01/19/22   [name], MD  Naphazoline HCl (CLEAR EYES OP) Place 1 drop into both eyes as needed (irritation).    [provider]  polyethylene glycol powder (GLYCOLAX/MIRALAX) 17 GM/SCOOP powder Take 17 g by mouth 2 (two) times daily as needed. 01/21/21   [name], MD  insulin aspart protamine- aspart (NOVOLOG MIX 70/30) (70-30) 100 UNIT/ML injection Inject 0.35 mLs (35 Units total) into the skin 2 (two) times daily. 04/17/20 05/13/20  Argentina Donovan, PA-C  losartan-hydrochlorothiazide (HYZAAR) 100-25 MG tablet TAKE  1 TABLET BY MOUTH DAILY. 12/19/20 12/22/20  Shahmehdi, Valeria Batman, MD  metFORMIN (GLUCOPHAGE-XR) 500 MG 24 hr tablet TAKE 1 TABLET (500 MG TOTAL) BY MOUTH 2 (TWO) TIMES DAILY. 12/19/20 12/22/20  Deatra James, MD    Physical Exam: Vitals:   01/23/22 0413 01/23/22 0435 01/23/22 0445 01/23/22 0515  BP: (!) 184/132 (!) 204/134 (!) 169/121 (!) 169/99  Pulse: (!) 102 (!) 112 98 (!) 104  Resp: 18 (!) 24 (!) 24 18  Temp: 98.3 F (36.8 C)     TempSrc: Oral     SpO2: 99% 100% 100% 100%   Exam  Constitutional: Middle-age male who appears to be no acute distress Eyes: Poor visual acuity out of both eyes.  Lids and conjunctivae normal ENMT: Mucous membranes are moist. Posterior pharynx clear of any exudate or lesions.  Neck: normal, supple, JVD present Respiratory: Normal respiratory effort with crackles appreciated most notably on the right lung field. Cardiovascular: Regular rate and rhythm, no murmurs / rubs / gallops.  At least 2+ pitting edema of the lower extremities with left arm swelling as well. Abdomen: no tenderness, no masses palpated.   Bowel sounds positive.  Musculoskeletal: no clubbing / cyanosis.  BKA of the left leg and amputation of the second toe of the right foot. Skin: Ulcerations noted  at the medial aspect of the left leg and multiple ulcerations noted of the right leg that appear to be healing. Neurologic: CN 2-12 grossly intact. Strength 5/5 in all 4.  Psychiatric: Normal judgment and insight. Alert and oriented x 3. Normal mood.   Data Reviewed:  EKG reveals sinus tachycardia at 101 bpm with right axis deviation and QT prolonged at 495  Assessment and Plan: Chest pain elevated troponin Acute.  Patient presents with left side chest pain.  High-sensitivity troponin 250-> 252.  EKG without clear ischemic changes.  Patient had been given full dose aspirin and started on heparin drip per pharmacy.  Suspect secondary to demand in the setting of CHF.  Records note patient with prior Lexiscan stress test in 10/22 which was negative for stress-induced arrhythmias, concern for ischemia, or infarction.  Study did note some decreased LV function with global hypokinesis.  She is followed by Dr. Radford Pax of cardiology in the outpatient setting for -Admit to a progressive bed -Continue heparin drip  -Check repeat EKG -Check echocardiogram -Cardiology consulted,  will follow-up for any further recommendation  Acute on chronic combined systolic and diastolic congestive heart failure Patient reported that he had been having progressively worsening swelling of his lower extremities after running out of Lasix.  BNP 2005.4.  Chest x-ray noted cardiomegaly with pulmonary vascular congestion and what is suspected to be edema with right-sided pleural effusion.  Patient had been given Lasix 80 mg IV in the ED. -Strict I&Os and daily weights -Follow-up echocardiogram -Continue Lasix 40 mg IV twice daily monitoring kidney function  Acute kidney injury superimposed on chronic kidney disease stage IV Patient presents with creatinine elevated up to 4.1 with BUN 38.  Baseline creatinine previously had been around 2.4-2.7 previously.  In the setting of CHF exacerbation question hypoperfusion likely  cause of worsening kidney disease.  Patient is followed by [name] in the outpatient setting.  At this time he is still making urine. -Monitor kidney function while diuresing -Consider need to formally consult [facility] if kidney function further declines or patient stops being able to make urine  Hypertensive emergency Blood pressures initially elevated up to  204/134.  Findings concerning for endorgan damage noted by elevated troponin and worsening kidney function.  Home medication regimen includes amlodipine 10 mg daily, Coreg 25 mg twice daily, furosemide 80 mg daily, isosorbide mononitrate 60 mg daily, metolazone 5 mg twice daily. -Continue Coreg, amlodipine, isosorbide mononitrate -Held p.o. furosemide while trying to diurese with IV  Prolonged QT interval Acute.  Initial QTc 495 on admission.  Diabetes mellitus type 2, with hyperglycemia with polyneuropathy On admission glucose 151.  Last hemoglobin A1c 7.2 in 02/2021.  Home medication regimen includes Lantus 10 units nightly.   -Hypoglycemic protocols -Add on hemoglobin A1c  -Pharmacy substitution of Semglee 5 units nightly -CBGs before every meal with very sensitive SSI -Adjust regimen as needed  Hyponatremia Acute.  Sodium level was just mildly low at 134.  Suspect hypovolemic hyponatremia as patient appears to be acutely fluid overloaded.    -Recheck sodium levels in a.m.  Anemia due to chronic kidney disease Hemoglobin 12.2 g/dL and appears slightly better than previous. 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 -Continue Lyrica and Tylenol 3 as needed for pain  Tobacco abuse Patient still reports smoking cigarettes some although had previously been tried quitting in February of this year.   Patient has at least a 32 smoking pack-year history of tobacco use.  DVT prophylaxis: Heparin Advance Care Planning:   Code Status: Full Code   Consults: Cardiology  Family Communication: Patient declined the need to update family when asked  Severity of Illness: The appropriate patient status for this patient is INPATIENT. Inpatient status is judged to be reasonable and necessary in order to provide the required intensity of service to ensure the patient's safety. The patient's presenting symptoms, physical exam findings, and initial radiographic and laboratory data in the context of their chronic comorbidities is felt to place them at high risk for further clinical deterioration. Furthermore, it is not anticipated that the patient will be medically stable for discharge from the hospital within 2 midnights of admission.   * I certify that at the point of admission it is my clinical judgment that the patient will require inpatient hospital care spanning beyond 2 midnights from the point of admission due to high intensity of service, high risk for further deterioration and high frequency of surveillance required.*  Author: Norval Morton, MD 01/23/2022 7:20 AM  For on call review [email or web address].

## 2022-01-23 NOTE — ED Notes (Signed)
Looks like purple man was done w/ dayshift RN name Todd Mccoy originally ~ 18:30 tonight. Sorry, we thought dayshift already got report. Todd Mccoy will be receiving this pt tonight. Thanks!

## 2022-01-23 NOTE — ED Triage Notes (Signed)
Patient with chest pain, no nausea or vomiting or shortness of breath.  Patient states that it is at the top of his chest in the center and on the left.

## 2022-01-23 NOTE — ED Notes (Signed)
Todd Mccoy [phone] would like an update immediately

## 2022-01-23 NOTE — ED Provider Notes (Addendum)
[facility] EMERGENCY DEPARTMENT Provider Note   CSN: [id number] Arrival date & time: 01/23/22  0235     History  Chief Complaint  Patient presents with   Chest Pain    Todd Mccoy is a 51 y.o. male.  The history is provided by the patient.  Chest Pain Pain location:  Substernal area Pain radiates to:  Does not radiate Pain severity:  Moderate Onset quality:  Sudden Timing:  Constant Progression:  Partially resolved Context: at rest        Home Medications Prior to Admission medications   Medication Sig Start Date End Date Taking? Authorizing Provider  hydrOXYzine (ATARAX) 25 MG tablet TAKE 1 TABLET BY MOUTH AT BEDTIME AS NEEDED Patient not taking: Reported on 01/19/2022 10/07/21   [name], MD  acetaminophen (TYLENOL) 325 MG tablet Take 650 mg by mouth every 6 (six) hours as needed for mild pain.    [provider]  acetaminophen-codeine (TYLENOL #3) 300-30 MG tablet Take 1 tablet by mouth every 12 (twelve) hours as needed for moderate pain. 01/19/22   Suzan Slick, NP  amLODipine (NORVASC) 10 MG tablet Take 1 tablet (10 mg total) by mouth daily. 01/19/22   [name], MD  atorvastatin (LIPITOR) 20 MG tablet Take 1 tablet (20 mg total) by mouth daily. 04/23/21   [name], MD  carvedilol (COREG) 25 MG tablet Take 1 tablet (25 mg total) by mouth 2 (two) times daily. 05/21/21   [name], MD  doxycycline (VIBRAMYCIN) 100 MG capsule Take 1 capsule (100 mg total) by mouth 2 (two) times daily. Patient not taking: Reported on 09/30/2021 08/18/21   Rodriguez-Southworth, Sunday Spillers, PA-C  ferrous sulfate 325 (65 FE) MG tablet Take 1 tablet (325 mg total) by mouth 3 (three) times daily with meals. Patient taking differently: Take 325 mg by mouth in the morning and at bedtime. 12/19/20 04/11/21  ShahmehdiValeria Batman, MD  furosemide (LASIX) 80 MG tablet Take 1 tablet (80 mg total) by mouth daily. 01/19/22   [name], MD   gabapentin (NEURONTIN) 300 MG capsule Take 1 capsule (300 mg total) by mouth 3 (three) times daily. Patient not taking: Reported on 01/19/2022 11/03/21   Suzan Slick, NP  hydrALAZINE (APRESOLINE) 100 MG tablet Take 1 tablet (100 mg total) by mouth 3 (three) times daily. Patient not taking: Reported on 01/19/2022 03/06/21   Kathie Dike, MD  insulin glargine (LANTUS) 100 UNIT/ML Solostar Pen Inject 10 Units into the skin at bedtime. 03/06/21 03/06/22  Kathie Dike, MD  isosorbide mononitrate (IMDUR) 60 MG 24 hr tablet Take 1 tablet (60 mg total) by mouth daily. 01/19/22   [name], MD  Naphazoline HCl (CLEAR EYES OP) Place 1 drop into both eyes as needed (irritation).    [provider]  polyethylene glycol powder (GLYCOLAX/MIRALAX) 17 GM/SCOOP powder Take 17 g by mouth 2 (two) times daily as needed. 01/21/21   [name], MD  insulin aspart protamine- aspart (NOVOLOG MIX 70/30) (70-30) 100 UNIT/ML injection Inject 0.35 mLs (35 Units total) into the skin 2 (two) times daily. 04/17/20 05/13/20  Argentina Donovan, PA-C  losartan-hydrochlorothiazide (HYZAAR) 100-25 MG tablet TAKE 1 TABLET BY MOUTH DAILY. 12/19/20 12/22/20  Shahmehdi, Valeria Batman, MD  metFORMIN (GLUCOPHAGE-XR) 500 MG 24 hr tablet TAKE 1 TABLET (500 MG TOTAL) BY MOUTH 2 (TWO) TIMES DAILY. 12/19/20 12/22/20  Deatra James, MD      Allergies    Patient has  no known allergies.    Review of Systems   Review of Systems  Cardiovascular:  Positive for chest pain.    Physical Exam Updated Vital Signs BP (!) 169/99   Pulse (!) 104   Temp 98.3 F (36.8 C) (Oral)   Resp 18   SpO2 100%  Physical Exam Vitals and nursing note reviewed.  Constitutional:      General: He is not in acute distress.    Appearance: He is well-developed. He is not diaphoretic.  HENT:     Head: Normocephalic and atraumatic.     Nose: Nose normal.  Eyes:     Conjunctiva/sclera: Conjunctivae normal.     Pupils: Pupils are equal, round, and  reactive to light.  Cardiovascular:     Rate and Rhythm: Normal rate and regular rhythm.     Pulses: Normal pulses.     Heart sounds: Normal heart sounds.  Pulmonary:     Effort: Pulmonary effort is normal.     Breath sounds: No wheezing or rales.  Abdominal:     General: Bowel sounds are normal.     Palpations: Abdomen is soft.     Tenderness: There is no abdominal tenderness. There is no guarding or rebound.  Musculoskeletal:        General: Normal range of motion.     Cervical back: Normal range of motion and neck supple.  Skin:    General: Skin is warm and dry.     Capillary Refill: Capillary refill takes less than 2 seconds.  Neurological:     Mental Status: He is alert and oriented to person, place, and time.     ED Results / Procedures / Treatments   Labs (all labs ordered are listed, but only abnormal results are displayed) Results for orders placed or performed during the hospital encounter of 01/23/22  CBC with Differential  Result Value Ref Range   WBC 5.2 4.0 - 10.5 K/uL   RBC 4.46 4.22 - 5.81 MIL/uL   Hemoglobin 12.2 (L) 13.0 - 17.0 g/dL   HCT 40.4 39.0 - 52.0 %   MCV 90.6 80.0 - 100.0 fL   MCH 27.4 26.0 - 34.0 pg   MCHC 30.2 30.0 - 36.0 g/dL   RDW 15.4 11.5 - 15.5 %   Platelets 166 150 - 400 K/uL   nRBC 0.0 0.0 - 0.2 %   Neutrophils Relative % 72 %   Neutro Abs 3.7 1.7 - 7.7 K/uL   Lymphocytes Relative 19 %   Lymphs Abs 1.0 0.7 - 4.0 K/uL   Monocytes Relative 7 %   Monocytes Absolute 0.4 0.1 - 1.0 K/uL   Eosinophils Relative 2 %   Eosinophils Absolute 0.1 0.0 - 0.5 K/uL   Basophils Relative 0 %   Basophils Absolute 0.0 0.0 - 0.1 K/uL   Immature Granulocytes 0 %   Abs Immature Granulocytes 0.02 0.00 - 0.07 K/uL  Basic metabolic panel  Result Value Ref Range   Sodium 134 (L) 135 - 145 mmol/L   Potassium 4.2 3.5 - 5.1 mmol/L   Chloride 105 98 - 111 mmol/L   CO2 23 22 - 32 mmol/L   Glucose, Bld 151 (H) 70 - 99 mg/dL   BUN 38 (H) 6 - 20 mg/dL    Creatinine, Ser 4.10 (H) 0.61 - 1.24 mg/dL   Calcium 7.5 (L) 8.9 - 10.3 mg/dL   GFR, Estimated 17 (L) >60 mL/min   Anion gap 6 5 - 15  Brain natriuretic peptide  Result Value Ref Range   B Natriuretic Peptide 2,005.4 (H) 0.0 - 100.0 pg/mL  Troponin I (High Sensitivity)  Result Value Ref Range   Troponin I (High Sensitivity) 250 (HH) <18 ng/L   DG Chest 2 View  Result Date: 01/23/2022 CLINICAL DATA:  Chest pain, shortness of breath. EXAM: CHEST - 2 VIEW COMPARISON:  02/27/2021. FINDINGS: Heart is enlarged and the mediastinal contour is within normal limits. The pulmonary vasculature is distended. Mild perihilar airspace disease is noted on the right. There is mild atelectasis on the left. There is a moderate pleural effusion on the right. No pneumothorax bilaterally. No acute osseous abnormality. IMPRESSION: 1. Cardiomegaly with pulmonary vascular congestion. 2. Mild perihilar airspace disease on the right, possible atelectasis, edema, or infiltrate. 3. Moderate right pleural effusion. Electronically Signed   By: Brett Fairy M.D.   On: 01/23/2022 03:40     EKG See muse   Radiology DG Chest 2 View  Result Date: 01/23/2022 CLINICAL DATA:  Chest pain, shortness of breath. EXAM: CHEST - 2 VIEW COMPARISON:  02/27/2021. FINDINGS: Heart is enlarged and the mediastinal contour is within normal limits. The pulmonary vasculature is distended. Mild perihilar airspace disease is noted on the right. There is mild atelectasis on the left. There is a moderate pleural effusion on the right. No pneumothorax bilaterally. No acute osseous abnormality. IMPRESSION: 1. Cardiomegaly with pulmonary vascular congestion. 2. Mild perihilar airspace disease on the right, possible atelectasis, edema, or infiltrate. 3. Moderate right pleural effusion. Electronically Signed   By: Brett Fairy M.D.   On: 01/23/2022 03:40    Procedures Procedures    Medications Ordered in ED Medications  heparin ADULT infusion 100  units/mL (25000 units/221mL) (has no administration in time range)  aspirin chewable tablet 324 mg (324 mg Oral Given 01/23/22 0529)  furosemide (LASIX) injection 80 mg (80 mg Intravenous Given 01/23/22 8182)    ED Course/ Medical Decision Making/ A&P                           Medical Decision Making Patient with CP and has not had is lasix in several days.  Symptoms started at rest   Problems Addressed: Congestive heart failure, unspecified HF chronicity, unspecified heart failure type [facility]): acute illness or injury    Details: lasix 80 mg IV Elevated troponin I level:    Details: likely due to CHF and renal insufficiency Renal insufficiency: chronic illness or injury  Amount and/or Complexity of Data Reviewed External Data Reviewed: notes.    Details: previous notes reviewed Labs: ordered.    Details: elevated troponin 250, elevated BNP 2005.4, elevated creatinine 4.10 elevated BUN 38.  normal white count, low hemoglobin 12.2, low sodium 134 Radiology: ordered and independent interpretation performed.    Details: CHF with pleural effusion by me ECG/medicine tests: ordered and independent interpretation performed. Decision-making details documented in ED Course. Discussion of management or test interpretation with external provider(s): Case d/w Dr. Bridgett Larsson am doctor to admit Call placed to Dr. Humphrey Rolls at 530, no call back.   Case d/w Dr. Claiborne Billings have triad call am attending   Risk OTC drugs. Prescription drug management. Decision regarding hospitalization.  Critical Care Total time providing critical care: 30 minutes (Heparin drips )    Final Clinical Impression(s) / ED Diagnoses Final diagnoses:  Congestive heart failure, unspecified HF chronicity, unspecified heart failure type ([place])  Renal insufficiency  Elevated troponin I level   The patient appears reasonably  stabilized for admission considering the current resources, flow, and capabilities available in the ED at this time,  and I doubt any other [facility] requiring further screening and/or treatment in the ED prior to admission.  Rx / DC Orders ED Discharge Orders     None         Prakriti Carignan, MD 01/23/22 [place], Skyla Champagne, MD 02/09/22 2311

## 2022-01-24 DIAGNOSIS — N179 Acute kidney failure, unspecified: Secondary | ICD-10-CM | POA: Diagnosis not present

## 2022-01-24 LAB — GLUCOSE, CAPILLARY
Glucose-Capillary: 146 mg/dL — ABNORMAL HIGH (ref 70–99)
Glucose-Capillary: 129 mg/dL — ABNORMAL HIGH (ref 70–99)
Glucose-Capillary: 135 mg/dL — ABNORMAL HIGH (ref 70–99)
Glucose-Capillary: 208 mg/dL — ABNORMAL HIGH (ref 70–99)

## 2022-01-24 LAB — CBC
HCT: 31.8 % — ABNORMAL LOW (ref 39.0–52.0)
Hemoglobin: 9.8 g/dL — ABNORMAL LOW (ref 13.0–17.0)
MCH: 27 pg (ref 26.0–34.0)
MCHC: 30.8 g/dL (ref 30.0–36.0)
MCV: 87.6 fL (ref 80.0–100.0)
Platelets: 157 10*3/uL (ref 150–400)
RBC: 3.63 MIL/uL — ABNORMAL LOW (ref 4.22–5.81)
RDW: 15.2 % (ref 11.5–15.5)
WBC: 5.3 10*3/uL (ref 4.0–10.5)
nRBC: 0 % (ref 0.0–0.2)

## 2022-01-24 LAB — MAGNESIUM: Magnesium: 1.3 mg/dL — ABNORMAL LOW (ref 1.7–2.4)

## 2022-01-24 LAB — RENAL FUNCTION PANEL
Albumin: 1.7 g/dL — ABNORMAL LOW (ref 3.5–5.0)
Anion gap: 7 (ref 5–15)
BUN: 39 mg/dL — ABNORMAL HIGH (ref 6–20)
CO2: 24 mmol/L (ref 22–32)
Calcium: 7.3 mg/dL — ABNORMAL LOW (ref 8.9–10.3)
Chloride: 109 mmol/L (ref 98–111)
Creatinine, Ser: 4.2 mg/dL — ABNORMAL HIGH (ref 0.61–1.24)
GFR, Estimated: 16 mL/min — ABNORMAL LOW (ref >60)
Glucose, Bld: 128 mg/dL — ABNORMAL HIGH (ref 70–99)
Phosphorus: 3.7 mg/dL (ref 2.5–4.6)
Potassium: 4.1 mmol/L (ref 3.5–5.1)
Sodium: 140 mmol/L (ref 135–145)

## 2022-01-24 LAB — HEPARIN LEVEL (UNFRACTIONATED)
Heparin Unfractionated: <0.1 IU/mL — ABNORMAL LOW (ref 0.30–0.70)
Heparin Unfractionated: <0.1 IU/mL — ABNORMAL LOW (ref 0.30–0.70)

## 2022-01-24 MED ORDER — HEPARIN BOLUS VIA INFUSION
2000.0000 [IU] | Freq: Once | INTRAVENOUS | Status: AC
  Administered 2022-01-24: 2000 [IU] via INTRAVENOUS
  Filled 2022-01-24: qty 2000

## 2022-01-24 MED ORDER — FUROSEMIDE 10 MG/ML IJ SOLN
80.0000 mg | Freq: Two times a day (BID) | INTRAMUSCULAR | Status: DC
Start: 2022-01-24 — End: 2022-01-25
  Administered 2022-01-24: 80 mg via INTRAVENOUS
  Filled 2022-01-24: qty 8

## 2022-01-24 MED ORDER — HEPARIN BOLUS VIA INFUSION
2700.0000 [IU] | Freq: Once | INTRAVENOUS | Status: AC
  Administered 2022-01-24: 2700 [IU] via INTRAVENOUS
  Filled 2022-01-24: qty 2700

## 2022-01-24 MED ORDER — MAGNESIUM SULFATE 2 GM/50ML IV SOLN
2.0000 g | Freq: Once | INTRAVENOUS | Status: AC
  Administered 2022-01-24: 2 g via INTRAVENOUS
  Filled 2022-01-24: qty 50

## 2022-01-24 NOTE — Progress Notes (Signed)
Pt seen in consultation 01/24/22. See consult note for today's recommendations.

## 2022-01-24 NOTE — Progress Notes (Signed)
Mobility Specialist Progress Note    01/24/22 1150  Mobility  Activity Stood at bedside  Level of Assistance Minimal assist, patient does 75% or more  Assistive Device Front wheel walker  Activity Response Tolerated well  $Mobility charge 1 Mobility   Pt received in bed and agreeable. Completed x3 STS, needed cues for hand placement. Returned to sitting EOB w/ NT present.   Hildred Alamin Mobility Specialist

## 2022-01-24 NOTE — Plan of Care (Signed)
Patient remains on Cardiac PCU. Now off of heparin infusion.    Problem: Education: Goal: Ability to demonstrate management of disease process will improve Outcome: Progressing Goal: Ability to verbalize understanding of medication therapies will improve Outcome: Progressing Goal: Individualized Educational Video(s) Outcome: Progressing   Problem: Activity: Goal: Capacity to carry out activities will improve Outcome: Progressing   Problem: Cardiac: Goal: Ability to achieve and maintain adequate cardiopulmonary perfusion will improve Outcome: Progressing   Problem: Education: Goal: Ability to describe self-care measures that may prevent or decrease complications (Diabetes Survival Skills Education) will improve Outcome: Progressing Goal: Individualized Educational Video(s) Outcome: Progressing   Problem: Coping: Goal: Ability to adjust to condition or change in health will improve Outcome: Progressing   Problem: Fluid Volume: Goal: Ability to maintain a balanced intake and output will improve Outcome: Progressing   Problem: Health Behavior/Discharge Planning: Goal: Ability to identify and utilize available resources and services will improve Outcome: Progressing Goal: Ability to manage health-related needs will improve Outcome: Progressing   Problem: Metabolic: Goal: Ability to maintain appropriate glucose levels will improve Outcome: Progressing   Problem: Nutritional: Goal: Maintenance of adequate nutrition will improve Outcome: Progressing Goal: Progress toward achieving an optimal weight will improve Outcome: Progressing   Problem: Skin Integrity: Goal: Risk for impaired skin integrity will decrease Outcome: Progressing   Problem: Tissue Perfusion: Goal: Adequacy of tissue perfusion will improve Outcome: Progressing   Problem: Education: Goal: Knowledge of General Education information will improve Description: Including pain rating scale,  medication(s)/side effects and non-pharmacologic comfort measures Outcome: Progressing   Problem: Health Behavior/Discharge Planning: Goal: Ability to manage health-related needs will improve Outcome: Progressing   Problem: Clinical Measurements: Goal: Ability to maintain clinical measurements within normal limits will improve Outcome: Progressing Goal: Will remain free from infection Outcome: Progressing Goal: Diagnostic test results will improve Outcome: Progressing Goal: Respiratory complications will improve Outcome: Progressing Goal: Cardiovascular complication will be avoided Outcome: Progressing   Problem: Activity: Goal: Risk for activity intolerance will decrease Outcome: Progressing   Problem: Nutrition: Goal: Adequate nutrition will be maintained Outcome: Progressing   Problem: Coping: Goal: Level of anxiety will decrease Outcome: Progressing   Problem: Elimination: Goal: Will not experience complications related to bowel motility Outcome: Progressing Goal: Will not experience complications related to urinary retention Outcome: Progressing   Problem: Pain Managment: Goal: General experience of comfort will improve Outcome: Progressing   Problem: Safety: Goal: Ability to remain free from injury will improve Outcome: Progressing   Problem: Skin Integrity: Goal: Risk for impaired skin integrity will decrease Outcome: Progressing

## 2022-01-24 NOTE — Progress Notes (Addendum)
ANTICOAGULATION CONSULT NOTE - Follow Up Consult  Pharmacy Consult for heparin Indication: chest pain/ACS  Labs: Recent Labs    01/23/22 0309 01/23/22 0622 01/24/22 0141 01/24/22 1050  HGB 12.2*  --  9.8*  --   HCT 40.4  --  31.8*  --   PLT 166  --  157  --   HEPARINUNFRC  --   --  <0.10* <0.10*  CREATININE 4.10*  --  4.20*  --   TROPONINIHS 250* 252*  --   --      Assessment: 51yo male subtherapeutic on heparin with initial dosing for CP; no infusion issues or signs of bleeding per RN.   Goal of Therapy:  Heparin level 0.3-0.7 units/ml   Plan:  Will rebolus with heparin 2700 units  Increase heparin infusion by 4 units/kg/hr to 1800 units/hr F/u heparin level in 8 hours   Francena Hanly, PharmD Pharmacy Resident  01/24/2022 1:09 PM _______________________________________________________________ I discussed / reviewed the pharmacy note by Dr. Oletta Lamas and I agree with the resident's findings and plans as documented. Vaughan Basta BS, PharmD, BCPS Clinical Pharmacist 01/24/2022 1:19 PM  Contact: [phone] after 3 PM  "Be curious, not judgmental..." -Jamal Maes _______________________________________________________________

## 2022-01-24 NOTE — Progress Notes (Signed)
ANTICOAGULATION CONSULT NOTE - Follow Up Consult  Pharmacy Consult for heparin Indication: chest pain/ACS  Labs: Recent Labs    01/23/22 0309 01/23/22 0622 01/24/22 0141  HGB 12.2*  --  9.8*  HCT 40.4  --  31.8*  PLT 166  --  157  HEPARINUNFRC  --   --  <0.10*  CREATININE 4.10*  --  4.20*  TROPONINIHS 250* 252*  --     Assessment: 51yo male subtherapeutic on heparin with initial dosing for CP; no infusion issues (IV had been lost earlier but no issues since resumed at [facility]) or signs of bleeding per RN.  Goal of Therapy:  Heparin level 0.3-0.7 units/ml   Plan:  Will rebolus with heparin 2000 units and increase heparin infusion by 3 units/kg/hr to 1400 units/hr and check level in 8 hours.    Wynona Neat, PharmD, BCPS  01/24/2022,2:57 AM

## 2022-01-24 NOTE — Progress Notes (Signed)
PROGRESS NOTE    Todd Mccoy  OMV:[id number] DOB: [birth year] DOA: 01/23/2022 PCP: Todd Rakes, MD    Brief Narrative:  51 year old with history of hypertension, hyperlipidemia, chronic combined heart failure with known ejection fraction of 40% and grade 3 diastolic dysfunction, type 2 diabetes on insulin, CKD stage IV presented with left-sided chest pain, gradually worsening shortness of breath that he attributed to missing dose of Lyrica.  In the emergency room, afebrile.  Tachycardic and tachypneic.  Blood pressure 204/134 and on room air.  Creatinine 4.1, BNP 2000.  Troponin is 250.  Chest x-ray with cardiomegaly with pulmonary vascular congestion.  Patient was given aspirin, heparin infusion, dose of Lasix and admitted to the hospital.  Seen by cardiology.   Assessment & Plan:   Chest pain: Atypical chest pain.  Serial troponins remain flat.  Currently pain improved.  Acute coronary syndrome ruled out.  Continue aspirin and statin.  Discontinue heparin infusion today.  Acute on chronic combined heart failure: Currently off medications at home and recently resumed.  Has a significant renal compromise that will hinder diuretic effectiveness. Currently on Lasix 80 mg twice daily for diuresis.  Continue.  Intake output monitoring.  Daily weight.  Low salt and low fluid intake. Patient is already on carvedilol and Imdur.  Continue.  Followed by cardiology.  Acute kidney injury superimposed on chronic kidney disease stage IV: Previously reported baseline creatinine about 2.4-2.7.  This is likely in the setting of congestive heart failure exacerbation. Creatinine 4.1-4.2, urine output is adequate.  Diuresis and recheck renal functions tomorrow morning.  Follows up with [name], if worsening renal functions, will consult nephrology tomorrow.  Essential hypertension: Accelerated blood pressure on presentation.  Currently improved with amlodipine, Coreg and Imdur.   Monitor.  Hypomagnesemia: Severe.  Magnesium 1.3.  He still making adequate urine.  Replace with 2 g IV magnesium.  Recheck levels tomorrow morning.  Phosphorus was adequate.  Type 2 diabetes with hyperglycemia: Continue home doses of insulin.  A1c pending.  Left BKA, right second toe amputation: Stable.  Smoker: Continues to smoke.  Counseled.  Nicotine patch.   DVT prophylaxis:   SCDs   Code Status: Full code Family Communication: None Disposition Plan: Status is: Inpatient Remains inpatient appropriate because: Significant renal abnormalities, diuresis     Consultants:  Cardiology  Procedures:  None  Antimicrobials:  None   Subjective: Patient seen and examined.  Denies any chest pain.  He tells me that he needs to go home.  He tells me that his pain was because he forgot to space out his Tylenol 3 with Lyrica.  Denies any excessive shortness of breath. In the morning rounds, he was persistence on leaving the the hospital, I suggested he stay in complete treatment including IV diuresis and stabilizing his kidney functions.  He has agreed.  Objective: Vitals:   01/24/22 0309 01/24/22 0459 01/24/22 0940 01/24/22 1201  BP: (!) 150/104  (!) 140/91 132/88  Pulse: 88  77 69  Resp:  19 18 17   Temp: 98.4 F (36.9 C)  98.5 F (36.9 C) 98.2 F (36.8 C)  TempSrc: Oral  Oral Oral  SpO2:      Weight:  94.8 kg    Height:        Intake/Output Summary (Last 24 hours) at 01/24/2022 1252 Last data filed at 01/24/2022 1100 Gross per 24 hour  Intake 741.17 ml  Output 1940 ml  Net -1198.83 ml   Filed Weights   01/23/22 2157  01/24/22 0459  Weight: 94.8 kg 94.8 kg    Examination:  General exam: Appears calm and comfortable  Getting to the wheelchair and trying to get around.  On room air.  Chronically sick looking but not in any distress. Respiratory system: Mostly bilateral clear.  Fine crackles at bases. Cardiovascular system: S1 & S2 heard, RRR.  Trace bilateral pedal  edema. Gastrointestinal system: Abdomen is nondistended, soft and nontender. No organomegaly or masses felt. Normal bowel sounds heard. Central nervous system: Alert and oriented. No focal neurological deficits. Extremities:  Left BKA stump clean and dry.  Right second toe amputation clean and dry.    Data Reviewed: I have personally reviewed following labs and imaging studies  CBC: Recent Labs  Lab 01/23/22 0309 01/24/22 0141  WBC 5.2 5.3  NEUTROABS 3.7  --   HGB 12.2* 9.8*  HCT 40.4 31.8*  MCV 90.6 87.6  PLT 166 144   Basic Metabolic Panel: Recent Labs  Lab 01/23/22 0309 01/24/22 0141  NA 134* 140  K 4.2 4.1  CL 105 109  CO2 23 24  GLUCOSE 151* 128*  BUN 38* 39*  CREATININE 4.10* 4.20*  CALCIUM 7.5* 7.3*  MG  --  1.3*  PHOS  --  3.7   GFR: Estimated Creatinine Clearance: 24.6 mL/min (A) (by C-G formula based on SCr of 4.2 mg/dL (H)). Liver Function Tests: Recent Labs  Lab 01/24/22 0141  ALBUMIN 1.7*   No results for input(s): "LIPASE", "AMYLASE" in the last 168 hours. No results for input(s): "AMMONIA" in the last 168 hours. Coagulation Profile: No results for input(s): "INR", "PROTIME" in the last 168 hours. Cardiac Enzymes: No results for input(s): "CKTOTAL", "CKMB", "CKMBINDEX", "TROPONINI" in the last 168 hours. BNP (last 3 results) No results for input(s): "PROBNP" in the last 8760 hours. HbA1C: No results for input(s): "HGBA1C" in the last 72 hours. CBG: Recent Labs  Lab 01/23/22 1156 01/23/22 1632 01/23/22 2213 01/24/22 0934 01/24/22 1148  GLUCAP 77 96 101* 146* 129*   Lipid Profile: No results for input(s): "CHOL", "HDL", "LDLCALC", "TRIG", "CHOLHDL", "LDLDIRECT" in the last 72 hours. Thyroid Function Tests: No results for input(s): "TSH", "T4TOTAL", "FREET4", "T3FREE", "THYROIDAB" in the last 72 hours. Anemia Panel: No results for input(s): "VITAMINB12", "FOLATE", "FERRITIN", "TIBC", "IRON", "RETICCTPCT" in the last 72 hours. Sepsis  Labs: No results for input(s): "PROCALCITON", "LATICACIDVEN" in the last 168 hours.  No results found for this or any previous visit (from the past 240 hour(s)).       Radiology Studies: ECHOCARDIOGRAM COMPLETE  Result Date: 01/23/2022    ECHOCARDIOGRAM REPORT   Patient Name:   Todd Mccoy Date of Exam: 01/23/2022 Medical Rec #:  [id number]           Height:       70.0 in Accession #:    [id number]          Weight:       190.0 lb Date of Birth:  [birth year]           BSA:          2.042 m Patient Age:    89 years            BP:           152/101 mmHg Patient Gender: M                   HR:           67 bpm. Exam Location:  Inpatient Procedure: 2D Echo, 3D Echo, Cardiac Doppler, Color Doppler and Strain Analysis Indications:    CHF  History:        Patient has prior history of Echocardiogram examinations, most                 recent 02/27/2021. Signs/Symptoms:Edema; Risk                 Factors:Hypertension. ESRD.  Sonographer:    Merrie Roof RDCS Referring Phys: Norval Morton  Sonographer Comments: Global longitudinal strain was attempted. IMPRESSIONS  1. Left ventricular ejection fraction by 3D volume is 38 %. The left ventricle has moderate to severely decreased function. The left ventricle demonstrates global hypokinesis. There is severe left ventricular hypertrophy. Left ventricular diastolic parameters are consistent with Grade III diastolic dysfunction (restrictive). The average left ventricular global longitudinal strain is -8.8 %. The global longitudinal strain is abnormal. Strain pattern consistent with apical sparing, consider an evaluation for cardiac amyloidosis if clinically relevant.  2. Right ventricular systolic function is moderately reduced. The right ventricular size is mildly enlarged. Moderately increased right ventricular wall thickness. There is normal pulmonary artery systolic pressure. The estimated right ventricular systolic pressure is 83.3 mmHg.  3. Left atrial size was  severely dilated.  4. A small pericardial effusion is present. There is no evidence of cardiac tamponade.  5. The mitral valve is grossly normal. Mild mitral valve regurgitation. No evidence of mitral stenosis.  6. The aortic valve is tricuspid. There is mild thickening of the aortic valve. Aortic valve regurgitation is not visualized. No aortic stenosis is present.  7. The inferior vena cava is normal in size with greater than 50% respiratory variability, suggesting right atrial pressure of 3 mmHg. FINDINGS  Left Ventricle: Left ventricular ejection fraction by 3D volume is 38 %. The left ventricle has moderate to severely decreased function. The left ventricle demonstrates global hypokinesis. The average left ventricular global longitudinal strain is -8.8 %. The global longitudinal strain is abnormal. The left ventricular internal cavity size was normal in size. There is severe left ventricular hypertrophy. Left ventricular diastolic parameters are consistent with Grade III diastolic dysfunction (restrictive). Right Ventricle: The right ventricular size is mildly enlarged. Moderately increased right ventricular wall thickness. Right ventricular systolic function is moderately reduced. There is normal pulmonary artery systolic pressure. The tricuspid regurgitant velocity is 2.13 m/s, and with an assumed right atrial pressure of 3 mmHg, the estimated right ventricular systolic pressure is 82.5 mmHg. Left Atrium: Left atrial size was severely dilated. Right Atrium: Right atrial size was normal in size. Pericardium: A small pericardial effusion is present. There is no evidence of cardiac tamponade. Mitral Valve: The mitral valve is grossly normal. There is mild thickening of the mitral valve leaflet(s). Mild mitral valve regurgitation. No evidence of mitral valve stenosis. Tricuspid Valve: The tricuspid valve is normal in structure. Tricuspid valve regurgitation is mild . No evidence of tricuspid stenosis. Aortic  Valve: The aortic valve is tricuspid. There is mild thickening of the aortic valve. Aortic valve regurgitation is not visualized. No aortic stenosis is present. Pulmonic Valve: The pulmonic valve was normal in structure. Pulmonic valve regurgitation is mild. No evidence of pulmonic stenosis. Aorta: The aortic root is normal in size and structure. Venous: The inferior vena cava is normal in size with greater than 50% respiratory variability, suggesting right atrial pressure of 3 mmHg. IAS/Shunts: No atrial level shunt detected by color flow Doppler.  LEFT VENTRICLE PLAX 2D LVIDd:  4.10 cm         Diastology LVIDs:         3.40 cm         LV e' medial:    4.35 cm/s LV PW:         1.60 cm         LV E/e' medial:  20.0 LV IVS:        2.00 cm         LV e' lateral:   4.90 cm/s LVOT diam:     2.20 cm         LV E/e' lateral: 17.8 LVOT Area:     3.80 cm                                2D                                Longitudinal                                Strain                                2D Strain GLS  -8.8 %                                Avg:                                 3D Volume EF                                LV 3D EF:    Left                                             ventricul                                             ar                                             ejection                                             fraction                                             by 3D  volume is                                             38 %.                                 3D Volume EF:                                3D EF:        38 %                                LV EDV:       171 ml                                LV ESV:       106 ml                                LV SV:        65 ml RIGHT VENTRICLE            IVC RV Basal diam:  4.50 cm    IVC diam: 2.10 cm RV Mid diam:    3.80 cm RV S prime:     6.42 cm/s TAPSE (M-mode): 0.7 cm LEFT ATRIUM               Index        RIGHT ATRIUM           Index LA diam:        5.30 cm  2.60 cm/m   RA Area:     19.50 cm LA Vol (A2C):   130.0 ml 63.65 ml/m  RA Volume:   61.10 ml  29.92 ml/m LA Vol (A4C):   91.0 ml  44.56 ml/m LA Biplane Vol: 112.0 ml 54.84 ml/m   AORTA Ao Root diam: 3.50 cm Ao Asc diam:  3.00 cm MITRAL VALVE               TRICUSPID VALVE MV Area (PHT): 4.80 cm    TR Peak grad:   18.1 mmHg MV Decel Time: 158 msec    TR Vmax:        213.00 cm/s MV E velocity: 87.20 cm/s MV A velocity: 44.20 cm/s  SHUNTS MV E/A ratio:  1.97        Systemic Diam: 2.20 cm Cherlynn Kaiser MD Electronically signed by Cherlynn Kaiser MD Signature Date/Time: 01/23/2022/5:30:29 PM    Final    DG Chest 2 View  Result Date: 01/23/2022 CLINICAL DATA:  Chest pain, shortness of breath. EXAM: CHEST - 2 VIEW COMPARISON:  02/27/2021. FINDINGS: Heart is enlarged and the mediastinal contour is within normal limits. The pulmonary vasculature is distended. Mild perihilar airspace disease is noted on the right. There is mild atelectasis on the left. There is a moderate pleural effusion on the right. No pneumothorax bilaterally. No acute osseous abnormality. IMPRESSION: 1. Cardiomegaly with pulmonary vascular congestion. 2. Mild perihilar airspace disease on the right, possible atelectasis, edema, or  infiltrate. 3. Moderate right pleural effusion. Electronically Signed   By: Brett Fairy M.D.   On: 01/23/2022 03:40        Scheduled Meds:  amLODipine  10 mg Oral Daily   atorvastatin  20 mg Oral Daily   carvedilol  25 mg Oral BID   ferrous sulfate  325 mg Oral Q breakfast   furosemide  80 mg Intravenous BID   insulin aspart  0-6 Units Subcutaneous TID WC   insulin glargine-yfgn  5 Units Subcutaneous QHS   isosorbide mononitrate  60 mg Oral Daily   pregabalin  50 mg Oral BID   sodium chloride flush  3 mL Intravenous Q12H   Continuous Infusions:  heparin 1,400 Units/hr (01/24/22 1100)     LOS: 1 day    Time spent: 35  minutes    Barb Merino, MD [facility] Pager [phone]

## 2022-01-25 ENCOUNTER — Inpatient Hospital Stay (HOSPITAL_COMMUNITY): Payer: Medicaid Other

## 2022-01-25 DIAGNOSIS — E1122 Type 2 diabetes mellitus with diabetic chronic kidney disease: Secondary | ICD-10-CM | POA: Diagnosis not present

## 2022-01-25 DIAGNOSIS — I639 Cerebral infarction, unspecified: Secondary | ICD-10-CM | POA: Diagnosis not present

## 2022-01-25 DIAGNOSIS — N289 Disorder of kidney and ureter, unspecified: Secondary | ICD-10-CM

## 2022-01-25 DIAGNOSIS — I132 Hypertensive heart and chronic kidney disease with heart failure and with stage 5 chronic kidney disease, or end stage renal disease: Secondary | ICD-10-CM | POA: Diagnosis not present

## 2022-01-25 DIAGNOSIS — N189 Chronic kidney disease, unspecified: Secondary | ICD-10-CM | POA: Diagnosis not present

## 2022-01-25 DIAGNOSIS — I5040 Unspecified combined systolic (congestive) and diastolic (congestive) heart failure: Secondary | ICD-10-CM | POA: Diagnosis not present

## 2022-01-25 DIAGNOSIS — N186 End stage renal disease: Secondary | ICD-10-CM | POA: Diagnosis not present

## 2022-01-25 DIAGNOSIS — E871 Hypo-osmolality and hyponatremia: Secondary | ICD-10-CM | POA: Diagnosis not present

## 2022-01-25 DIAGNOSIS — Z833 Family history of diabetes mellitus: Secondary | ICD-10-CM | POA: Diagnosis not present

## 2022-01-25 DIAGNOSIS — H547 Unspecified visual loss: Secondary | ICD-10-CM | POA: Diagnosis not present

## 2022-01-25 DIAGNOSIS — R079 Chest pain, unspecified: Secondary | ICD-10-CM | POA: Diagnosis not present

## 2022-01-25 DIAGNOSIS — D649 Anemia, unspecified: Secondary | ICD-10-CM | POA: Diagnosis not present

## 2022-01-25 DIAGNOSIS — N179 Acute kidney failure, unspecified: Secondary | ICD-10-CM | POA: Diagnosis not present

## 2022-01-25 DIAGNOSIS — D631 Anemia in chronic kidney disease: Secondary | ICD-10-CM | POA: Diagnosis not present

## 2022-01-25 DIAGNOSIS — E785 Hyperlipidemia, unspecified: Secondary | ICD-10-CM | POA: Diagnosis not present

## 2022-01-25 DIAGNOSIS — I5043 Acute on chronic combined systolic (congestive) and diastolic (congestive) heart failure: Secondary | ICD-10-CM | POA: Diagnosis not present

## 2022-01-25 DIAGNOSIS — R778 Other specified abnormalities of plasma proteins: Secondary | ICD-10-CM | POA: Diagnosis not present

## 2022-01-25 DIAGNOSIS — I5023 Acute on chronic systolic (congestive) heart failure: Secondary | ICD-10-CM

## 2022-01-25 DIAGNOSIS — N184 Chronic kidney disease, stage 4 (severe): Secondary | ICD-10-CM | POA: Diagnosis not present

## 2022-01-25 DIAGNOSIS — E1142 Type 2 diabetes mellitus with diabetic polyneuropathy: Secondary | ICD-10-CM | POA: Diagnosis not present

## 2022-01-25 DIAGNOSIS — I161 Hypertensive emergency: Secondary | ICD-10-CM | POA: Diagnosis not present

## 2022-01-25 DIAGNOSIS — I160 Hypertensive urgency: Secondary | ICD-10-CM | POA: Diagnosis not present

## 2022-01-25 LAB — CBC
HCT: 30.7 % — ABNORMAL LOW (ref 39.0–52.0)
Hemoglobin: 9.4 g/dL — ABNORMAL LOW (ref 13.0–17.0)
MCH: 27.3 pg (ref 26.0–34.0)
MCHC: 30.6 g/dL (ref 30.0–36.0)
MCV: 89.2 fL (ref 80.0–100.0)
Platelets: 144 10*3/uL — ABNORMAL LOW (ref 150–400)
RBC: 3.44 MIL/uL — ABNORMAL LOW (ref 4.22–5.81)
RDW: 15.3 % (ref 11.5–15.5)
WBC: 4.2 10*3/uL (ref 4.0–10.5)
nRBC: 0 % (ref 0.0–0.2)

## 2022-01-25 LAB — MRSA NEXT GEN BY PCR, NASAL: MRSA by PCR Next Gen: NOT DETECTED

## 2022-01-25 LAB — RENAL FUNCTION PANEL
Albumin: 1.7 g/dL — ABNORMAL LOW (ref 3.5–5.0)
Anion gap: 9 (ref 5–15)
BUN: 42 mg/dL — ABNORMAL HIGH (ref 6–20)
CO2: 22 mmol/L (ref 22–32)
Calcium: 7.5 mg/dL — ABNORMAL LOW (ref 8.9–10.3)
Chloride: 106 mmol/L (ref 98–111)
Creatinine, Ser: 4.3 mg/dL — ABNORMAL HIGH (ref 0.61–1.24)
GFR, Estimated: 16 mL/min — ABNORMAL LOW (ref >60)
Glucose, Bld: 119 mg/dL — ABNORMAL HIGH (ref 70–99)
Phosphorus: 3.7 mg/dL (ref 2.5–4.6)
Potassium: 4.1 mmol/L (ref 3.5–5.1)
Sodium: 137 mmol/L (ref 135–145)

## 2022-01-25 LAB — MAGNESIUM: Magnesium: 1.6 mg/dL — ABNORMAL LOW (ref 1.7–2.4)

## 2022-01-25 LAB — GLUCOSE, CAPILLARY
Glucose-Capillary: 121 mg/dL — ABNORMAL HIGH (ref 70–99)
Glucose-Capillary: 102 mg/dL — ABNORMAL HIGH (ref 70–99)
Glucose-Capillary: 118 mg/dL — ABNORMAL HIGH (ref 70–99)
Glucose-Capillary: 118 mg/dL — ABNORMAL HIGH (ref 70–99)
Glucose-Capillary: 189 mg/dL — ABNORMAL HIGH (ref 70–99)

## 2022-01-25 MED ORDER — SODIUM CHLORIDE 0.9 % IV SOLN
INTRAVENOUS | Status: DC
Start: 2022-01-25 — End: 2022-01-25

## 2022-01-25 MED ORDER — MAGNESIUM SULFATE 2 GM/50ML IV SOLN
2.0000 g | Freq: Once | INTRAVENOUS | Status: AC
Start: 2022-01-25 — End: 2022-01-25
  Administered 2022-01-25: 2 g via INTRAVENOUS
  Filled 2022-01-25: qty 50

## 2022-01-25 NOTE — Progress Notes (Signed)
Progress Note  Patient Name: Todd Mccoy Date of Encounter: 01/25/2022  [facility] Cardiologist: Fransico Him, MD   Subjective   At [phone] this AM, RN was notified that patient felt like he "couldn't see anything" and like the light were out (room was well lit). Rapid response was called. Neurology evaluated patient, low suspicion for CRAO or acute stroke   Currently, patient has a mild headache but his vision has improved. Denies chest pain, sob, orthopnea. Ankle edema improved   Inpatient Medications    Scheduled Meds:  amLODipine  10 mg Oral Daily   atorvastatin  20 mg Oral Daily   carvedilol  25 mg Oral BID   ferrous sulfate  325 mg Oral Q breakfast   furosemide  80 mg Intravenous BID   insulin aspart  0-6 Units Subcutaneous TID WC   insulin glargine-yfgn  5 Units Subcutaneous QHS   isosorbide mononitrate  60 mg Oral Daily   pregabalin  50 mg Oral BID   sodium chloride flush  3 mL Intravenous Q12H   Continuous Infusions:  PRN Meds: acetaminophen **OR** acetaminophen, acetaminophen-codeine, albuterol, polyethylene glycol   Vital Signs    Vitals:   01/25/22 0400 01/25/22 0432 01/25/22 0500 01/25/22 0600  BP:  (!) 145/98    Pulse:      Resp: 13 18 (!) 21 10  Temp:  98.1 F (36.7 C)    TempSrc:  Oral    SpO2:      Weight:    94.1 kg  Height:        Intake/Output Summary (Last 24 hours) at 01/25/2022 0751 Last data filed at 01/25/2022 0600 Gross per 24 hour  Intake 1519.94 ml  Output 1800 ml  Net -280.06 ml      01/25/2022    6:00 AM 01/24/2022    4:59 AM 01/23/2022    9:57 PM  Last 3 Weights  Weight (lbs) 207 lb 7.3 oz 208 lb 15.9 oz 208 lb 15.9 oz  Weight (kg) 94.1 kg 94.8 kg 94.8 kg      Telemetry    Normal sinus rhythm, infrequent PVCs - Personally Reviewed  ECG    Normal Sinus Rhythm, HR 69 BPM - Personally Reviewed  Physical Exam   GEN: No acute distress.  Sitting at the edge of the bed eating breakfast  Neck: No JVD Cardiac:  RRR, s3 gallop present. Radial pulses 2+ bilaterally  Respiratory: Clear to auscultation bilaterally. GI: Soft, nontender, non-distended  MS: No edema, s/p L BKA Neuro:  Nonfocal  Psych: Normal affect   Labs    High Sensitivity Troponin:   Recent Labs  Lab 01/23/22 0309 01/23/22 0622  TROPONINIHS 250* 252*     Chemistry Recent Labs  Lab 01/23/22 0309 01/24/22 0141 01/25/22 0433  NA 134* 140 137  K 4.2 4.1 4.1  CL 105 109 106  CO2 23 24 22   GLUCOSE 151* 128* 119*  BUN 38* 39* 42*  CREATININE 4.10* 4.20* 4.30*  CALCIUM 7.5* 7.3* 7.5*  MG  --  1.3* 1.6*  ALBUMIN  --  1.7* 1.7*  GFRNONAA 17* 16* 16*  ANIONGAP 6 7 9     Lipids No results for input(s): "CHOL", "TRIG", "HDL", "LABVLDL", "LDLCALC", "CHOLHDL" in the last 168 hours.  Hematology Recent Labs  Lab 01/23/22 0309 01/24/22 0141 01/25/22 0433  WBC 5.2 5.3 4.2  RBC 4.46 3.63* 3.44*  HGB 12.2* 9.8* 9.4*  HCT 40.4 31.8* 30.7*  MCV 90.6 87.6 89.2  MCH 27.4  27.0 27.3  MCHC 30.2 30.8 30.6  RDW 15.4 15.2 15.3  PLT 166 157 144*   Thyroid No results for input(s): "TSH", "FREET4" in the last 168 hours.  BNP Recent Labs  Lab 01/23/22 0309  BNP 2,005.4*    DDimer No results for input(s): "DDIMER" in the last 168 hours.   Radiology    CT HEAD CODE STROKE WO CONTRAST  Result Date: 01/25/2022 CLINICAL DATA:  Code stroke.  Loss of vision EXAM: CT HEAD WITHOUT CONTRAST TECHNIQUE: Contiguous axial images were obtained from the base of the skull through the vertex without intravenous contrast. RADIATION DOSE REDUCTION: This exam was performed according to the departmental dose-optimization program which includes automated exposure control, adjustment of the mA and/or kV according to patient size and/or use of iterative reconstruction technique. COMPARISON:  None Available. FINDINGS: Brain: No evidence of acute infarction, hemorrhage, hydrocephalus, extra-axial collection or mass lesion/mass effect. 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 Vascular: No hyperdense vessel or unexpected calcification. Skull: Normal. Negative for fracture or focal lesion. Sinuses/Orbits: Oil tamponade in the right globe. High-density subretinal appearance in the posterior left lobe. Other: These results were called by telephone at the time of interpretation on 01/25/2022 at 5:08 am to provider [facility] , who verbally acknowledged these results. ASPECTS [facility] Stroke Program Early CT Score) Not scored with this history IMPRESSION: 1. High-density sub retinal effusion on the left. 2. Oil tamponade in the right globe. 3. No acute intracranial finding. Electronically Signed   By: Jorje Guild M.D.   On: 01/25/2022 05:09   ECHOCARDIOGRAM COMPLETE  Result Date: 01/23/2022    ECHOCARDIOGRAM REPORT   Patient Name:   Todd Mccoy Date of Exam: 01/23/2022 Medical Rec #:  [id number]           Height:       70.0 in Accession #:    [id number]          Weight:       190.0 lb Date of Birth:  [birth year]           BSA:          2.042 m Patient Age:    51 years            BP:           152/101 mmHg Patient Gender: M                   HR:           67 bpm. Exam Location:  Inpatient Procedure: 2D Echo, 3D Echo, Cardiac Doppler, Color Doppler and Strain Analysis Indications:    CHF  History:        Patient has prior history of Echocardiogram examinations, most                 recent 02/27/2021. Signs/Symptoms:Edema; Risk                 Factors:Hypertension. ESRD.  Sonographer:    Merrie Roof RDCS Referring Phys: Norval Morton  Sonographer Comments: Global longitudinal strain was attempted. IMPRESSIONS  1. Left ventricular ejection fraction by 3D volume is 38 %. The left ventricle has moderate to severely decreased function. The left ventricle demonstrates global hypokinesis. There is severe left ventricular hypertrophy. Left ventricular diastolic parameters are consistent with Grade III diastolic dysfunction (restrictive). The average left  ventricular global longitudinal strain is -8.8 %. The global longitudinal strain is abnormal. Strain pattern consistent with apical  sparing, consider an evaluation for cardiac amyloidosis if clinically relevant.  2. Right ventricular systolic function is moderately reduced. The right ventricular size is mildly enlarged. Moderately increased right ventricular wall thickness. There is normal pulmonary artery systolic pressure. The estimated right ventricular systolic pressure is 76.1 mmHg.  3. Left atrial size was severely dilated.  4. A small pericardial effusion is present. There is no evidence of cardiac tamponade.  5. The mitral valve is grossly normal. Mild mitral valve regurgitation. No evidence of mitral stenosis.  6. The aortic valve is tricuspid. There is mild thickening of the aortic valve. Aortic valve regurgitation is not visualized. No aortic stenosis is present.  7. The inferior vena cava is normal in size with greater than 50% respiratory variability, suggesting right atrial pressure of 3 mmHg. FINDINGS  Left Ventricle: Left ventricular ejection fraction by 3D volume is 38 %. The left ventricle has moderate to severely decreased function. The left ventricle demonstrates global hypokinesis. The average left ventricular global longitudinal strain is -8.8 %. The global longitudinal strain is abnormal. The left ventricular internal cavity size was normal in size. There is severe left ventricular hypertrophy. Left ventricular diastolic parameters are consistent with Grade III diastolic dysfunction (restrictive). Right Ventricle: The right ventricular size is mildly enlarged. Moderately increased right ventricular wall thickness. Right ventricular systolic function is moderately reduced. There is normal pulmonary artery systolic pressure. The tricuspid regurgitant velocity is 2.13 m/s, and with an assumed right atrial pressure of 3 mmHg, the estimated right ventricular systolic pressure is 95.0 mmHg. Left  Atrium: Left atrial size was severely dilated. Right Atrium: Right atrial size was normal in size. Pericardium: A small pericardial effusion is present. There is no evidence of cardiac tamponade. Mitral Valve: The mitral valve is grossly normal. There is mild thickening of the mitral valve leaflet(s). Mild mitral valve regurgitation. No evidence of mitral valve stenosis. Tricuspid Valve: The tricuspid valve is normal in structure. Tricuspid valve regurgitation is mild . No evidence of tricuspid stenosis. Aortic Valve: The aortic valve is tricuspid. There is mild thickening of the aortic valve. Aortic valve regurgitation is not visualized. No aortic stenosis is present. Pulmonic Valve: The pulmonic valve was normal in structure. Pulmonic valve regurgitation is mild. No evidence of pulmonic stenosis. Aorta: The aortic root is normal in size and structure. Venous: The inferior vena cava is normal in size with greater than 50% respiratory variability, suggesting right atrial pressure of 3 mmHg. IAS/Shunts: No atrial level shunt detected by color flow Doppler.  LEFT VENTRICLE PLAX 2D LVIDd:         4.10 cm         Diastology LVIDs:         3.40 cm         LV e' medial:    4.35 cm/s LV PW:         1.60 cm         LV E/e' medial:  20.0 LV IVS:        2.00 cm         LV e' lateral:   4.90 cm/s LVOT diam:     2.20 cm         LV E/e' lateral: 17.8 LVOT Area:     3.80 cm                                2D  Longitudinal                                Strain                                2D Strain GLS  -8.8 %                                Avg:                                 3D Volume EF                                LV 3D EF:    Left                                             ventricul                                             ar                                             ejection                                             fraction                                             by 3D                                              volume is                                             38 %.                                 3D Volume EF:                                3D EF:        38 %  LV EDV:       171 ml                                LV ESV:       106 ml                                LV SV:        65 ml RIGHT VENTRICLE            IVC RV Basal diam:  4.50 cm    IVC diam: 2.10 cm RV Mid diam:    3.80 cm RV S prime:     6.42 cm/s TAPSE (M-mode): 0.7 cm LEFT ATRIUM              Index        RIGHT ATRIUM           Index LA diam:        5.30 cm  2.60 cm/m   RA Area:     19.50 cm LA Vol (A2C):   130.0 ml 63.65 ml/m  RA Volume:   61.10 ml  29.92 ml/m LA Vol (A4C):   91.0 ml  44.56 ml/m LA Biplane Vol: 112.0 ml 54.84 ml/m   AORTA Ao Root diam: 3.50 cm Ao Asc diam:  3.00 cm MITRAL VALVE               TRICUSPID VALVE MV Area (PHT): 4.80 cm    TR Peak grad:   18.1 mmHg MV Decel Time: 158 msec    TR Vmax:        213.00 cm/s MV E velocity: 87.20 cm/s MV A velocity: 44.20 cm/s  SHUNTS MV E/A ratio:  1.97        Systemic Diam: 2.20 cm Cherlynn Kaiser MD Electronically signed by Cherlynn Kaiser MD Signature Date/Time: 01/23/2022/5:30:29 PM    Final     Cardiac Studies   Echocardiogram 01/23/22  1. Left ventricular ejection fraction by 3D volume is 38 %. The left  ventricle has moderate to severely decreased function. The left ventricle  demonstrates global hypokinesis. There is severe left ventricular  hypertrophy. Left ventricular diastolic  parameters are consistent with Grade III diastolic dysfunction  (restrictive). The average left ventricular global longitudinal strain is  -8.8 %. The global longitudinal strain is abnormal. Strain pattern  consistent with apical sparing, consider an  evaluation for cardiac amyloidosis if clinically relevant.   2. Right ventricular systolic function is moderately reduced. The right  ventricular size is mildly enlarged. Moderately  increased right  ventricular wall thickness. There is normal pulmonary artery systolic  pressure. The estimated right ventricular  systolic pressure is 63.7 mmHg.   3. Left atrial size was severely dilated.   4. A small pericardial effusion is present. There is no evidence of  cardiac tamponade.   5. The mitral valve is grossly normal. Mild mitral valve regurgitation.  No evidence of mitral stenosis.   6. The aortic valve is tricuspid. There is mild thickening of the aortic  valve. Aortic valve regurgitation is not visualized. No aortic stenosis is  present.   7. The inferior vena cava is normal in size with greater than 50%  respiratory variability, suggesting right atrial pressure of 3 mmHg.   Patient Profile     51 y.o. 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 This is not consistent with ACS patten  - Currently chest pain free  - Patient had a normal nuclear stress test in 04/2021  - Suspect trop elevation is secondary to volume overload in the setting of AKI (creatinine 4 on admission)   Acute on chronic combined systolic and diastolic heart failure  - Echocardiogram this admission with EF 38%, global hypokinesis, grade III diastolic dysfunctino, moderately reduced RV systolic function  - CXR on admission showed moderate right pleural effusion, cardiomegaly with pulmonary vascular congestion, mild perihilar airspace disease on the right  - Currently on IV lasix 80 mg BID. Output 1.8 L urine yesterday.  - Creatinine has been uptrending  (4.10 on 7/22. Up to 4.30 today) - Patient does not appear volume overloaded on exam. Denies sob, chest pressure, orthopnea, ankle edema. Hold IV lasix for now - Patient follows with [facility]-- they have adjusted his diuretics in the past. Given patient's severity of renal dysfunction, it would be beneficial to involve nephrology for outpatient diuretic recommendations  - Continue carvedilol 25 mg BID  - GDMT limited by renal function   AKI on CKD  - Baseline creatinine was 2.5 in 05/2021, increased to 3.82 on 01/09/22. - This admission, creatinine was 4.1 on admission and now up to 4.3  - Avoid nephrotoxic medications - Consider nephrology consultation as above   HTN emergency  - Continue amlodipine 10 mg daily, carvedilol 25 mg BID, imdur 60 mg daily  - Follow BP with diuresis  - Consider addition of hydralazine if BP remains elevated   HLD  - Continue statin   Otherwise per primary  - Type 2 DM  - History of left BKA and right 2nd toe amputation  - Possible hematuria       For questions or updates, please contact [facility] Please consult [email or web address] for contact info under        Signed, Margie Billet, PA-C  01/25/2022, 7:51 AM

## 2022-01-25 NOTE — Significant Event (Signed)
Patient's denies at that on 4:24 AM January 25, 2022 notified me that patient has complained of loss of vision in the left eye completely.  Prior to which patient was able to see some.  Patient blood pressure is 145/98 pulse 69/min temperature 98.1 respiration 18/min.  Labs and medications noted.  On exam at bedside patient states he not able to see anything in the left eye and he states he usually can see shadows and outlines.  Able to move all extremities without difficulty no facial asymmetry no difficulty swallowing or speaking.  Consulted neurologist Dr. Reed Breech.  Awaiting neurology recommendation.  Todd Mccoy

## 2022-01-25 NOTE — Code Documentation (Signed)
Code Stroke initiated at 0445 on pt for visual changes, LSN-[id number]. CBG-121, NIH-2 for L hemianopia, CT head-negative for acute changes. After speaking to pt, LSN changed to over 2 months ago. TNK not given-out of window. Plan eye doctor consult.

## 2022-01-25 NOTE — Consult Note (Signed)
NEUROLOGY CONSULTATION NOTE   Date of service: January 25, 2022 Patient Name: Todd Mccoy MRN:  [id number] DOB:  May 04, 1971 Reason for consult: "L eye mono-occular vision loss" Requesting Provider: Barb Merino, MD _ _ _   _ __   _ __ _ _  __ __   _ __   __ _  History of Present Illness  Todd Mccoy is a 51 y.o. 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  His heparin was discontinued yesterday afternoon.  He was last seen at his baseline at 0300 on 01/25/2022.  He reported to his RN that he woke up and unable to see anything out of his left eye.  The presentation was discussed with the hospitalist team and eventually with me and I recommended that a code stroke be activated.  On my initial arrival and discussion with the patient, he did endorse clearly to me on multiple occasions that the left eye vision loss is new.  He denies any pain in his left eye.  He reports that he has decreased vision and visual acuity in both eyes and his right eye is better at baseline and at baseline he is barely able to make the outlines of some of the objects that he can see.  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  He now reports to me that he has been gradually losing vision in his right eye and the worsening vision today is not an abrupt change.  He reports history of retinal detachment and has had surgery in his right eye last year for that.  CT head without contrast with left eye retinal detachment.  LKW: atleast 2 months ago mRS: 3 tNKASE: not offered, low suspicion for an acute  CRAO. Thrombectomy: not offered, low suspicion for LVO NIHSS components Score: Comment  1a Level of Conscious 0[x]  1[]  2[]  3[]      1b LOC Questions 0[x]  1[]  2[]       1c LOC Commands 0[x]  1[]  2[]       2 Best Gaze 0[x]  1[]  2[]       3 Visual 0[]  1[]  2[x]  3[]      4 Facial Palsy 0[x]  1[]  2[]  3[]      5a Motor Arm - left 0[x]  1[]  2[]  3[]  4[]  UN[]    5b Motor Arm - Right 0[x]  1[]  2[]  3[]  4[]  UN[]    6a Motor Leg - Left 0[x]  1[]  2[]  3[]  4[]  UN[]    6b Motor Leg - Right 0[x]  1[]  2[]  3[]  4[]  UN[]    7 Limb Ataxia 0[x]  1[]  2[]  3[]  UN[]     8 Sensory 0[x]  1[]  2[]  UN[]      9 Best Language 0[x]  1[]  2[]  3[]      10 Dysarthria 0[x]  1[]  2[]  UN[]      11 Extinct. and Inattention 0[x]  1[]  2[]       TOTAL: 2      ROS   Constitutional Denies weight loss, fever and chills.   HEENT + changes in vision with intact hearing.   Respiratory Denies SOB and cough.   CV Denies palpitations and CP   GI Denies abdominal  pain, nausea, vomiting and diarrhea.   GU Denies dysuria and urinary frequency.   MSK Denies myalgia and joint pain.   Skin Denies rash and pruritus.   Neurological Denies headache and syncope.   Psychiatric Denies recent changes in mood. Denies anxiety and depression.    Past History   Past Medical History:  Diagnosis Date   Anemia    Anemia    Anxiety    Chronic combined systolic (congestive) and diastolic (congestive) heart failure (HCC)    EF 40-45% with G3DD on echo 2022   CKD (chronic kidney disease), stage IV ([place]) 12/10/2020   Diabetes mellitus with complication (HCC)    Diabetic neuropathy (HCC)    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[place]) 03/25/2020   pt states retinal eye damage- left worse than the right- recent visited MD    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[place]) 03/25/2020   pt states recent MD visit /left eye worse than right   Dyslipidemia 12/10/2020   Hx of BKA, left ([place])    Hypertension    Morbid obesity ([place])    Osteomyelitis ([place])    Pneumonia    Past Surgical  History:  Procedure Laterality Date   ABSCESS DRAINAGE     neck   AMPUTATION Left 11/14/2020   Procedure: LEFT 5TH RAY AMPUTATION;  Surgeon: Newt Minion, MD;  Location: [facility];  Service: Orthopedics;  Laterality: Left;   AMPUTATION Left 12/10/2020   Procedure: LEFT BELOW KNEE AMPUTATION;  Surgeon: Newt Minion, MD;  Location: [facility];  Service: Orthopedics;  Laterality: Left;   AMPUTATION TOE Right 03/25/2020   Procedure: AMPUTATION TOE;  Surgeon: Evelina Bucy, DPM;  Location: [facility];  Service: Podiatry;  Laterality: Right;   INCISION AND DRAINAGE ABSCESS Right 09/07/2014   Procedure: INCISION AND DRAINAGE ABSCESS RIGHT FLANK;  Surgeon: Jackolyn Confer, MD;  Location: [facility];  Service: General;  Laterality: Right;   LEG AMPUTATION BELOW KNEE Left 12/10/2020   SCROTUM EXPLORATION     Family History  Problem Relation Age of Onset   Diabetes Mother    Diabetes Father    Diabetes Brother    Colon cancer Neg Hx    Esophageal cancer Neg Hx    Pancreatic cancer Neg Hx    Stomach cancer Neg Hx    Liver disease Neg Hx    CAD Neg Hx    Social History   Socioeconomic History   Marital status: Single    Spouse name: Not on file   Number of children: 1   Years of education: Not on file   Highest education level: Not on file  Occupational History   Not on file  Tobacco Use   Smoking status: Some Days    Packs/day: 1.00    Years: 32.00    Total pack years: 32.00    Types: Cigarettes    Last attempt to quit: 08/24/2021    Years since quitting: 0.4    Passive exposure: Past   Smokeless tobacco: Former   Tobacco comments:    Quit smoking previously February 2023  Vaping Use   Vaping Use: Never used  Substance and Sexual Activity   Alcohol use: No   Drug use: No   Sexual activity: Not on file  Other Topics Concern   Not on file  Social History Narrative   Not on file   Social Determinants of Health   Financial Resource Strain: Low Risk  (09/30/2021)   Overall Financial  Resource Strain (CARDIA)    Difficulty of Paying Living Expenses: Not hard at all  Food Insecurity: No Food Insecurity (09/30/2021)   Hunger Vital Sign    Worried About Running Out of Food in the Last Year: Never true    Ran Out of Food in the Last Year: Never true  Transportation Needs: No Transportation Needs (09/30/2021)   PRAPARE - Hydrologist (Medical): No    Lack of Transportation (Non-Medical): No  Physical Activity: Inactive (09/30/2021)   Exercise Vital Sign    Days of Exercise per Week: 0 days    Minutes of Exercise per Session: 0 min  Stress: No Stress Concern Present (09/30/2021)   [place]    Feeling of Stress : Only a little  Social Connections: Moderately Isolated (09/30/2021)   Social Connection and Isolation Panel [NHANES]    Frequency of Communication with Friends and Family: More than three times a week    Frequency of Social Gatherings with Friends and Family: More than three times a week    Attends Religious Services: 1 to 4 times per year    Active Member of [employer] or Organizations: No    Attends Archivist Meetings: Never    Marital Status: Never married   No Known Allergies  Medications   Medications Prior to Admission  Medication Sig Dispense Refill Last Dose   acetaminophen (TYLENOL) 325 MG tablet Take 650 mg by mouth every 6 (six) hours as needed for mild pain.   01/22/2022   acetaminophen-codeine (TYLENOL #3) 300-30 MG tablet Take 1 tablet by mouth every 12 (twelve) hours as needed for moderate pain. 14 tablet 0 01/22/2022   amLODipine (NORVASC) 10 MG tablet Take 1 tablet (10 mg total) by mouth daily. 30 tablet 0 01/22/2022   atorvastatin (LIPITOR) 20 MG tablet Take 1 tablet (20 mg total) by mouth daily. 90 tablet 3 01/22/2022   carvedilol (COREG) 25 MG tablet Take 1 tablet (25 mg total) by mouth 2 (two) times daily. 180 tablet 3 01/22/2022 at 2300   ferrous  sulfate 325 (65 FE) MG tablet Take 1 tablet (325 mg total) by mouth 3 (three) times daily with meals. (Patient taking differently: Take 325 mg by mouth daily with breakfast.) 90 tablet 2 01/22/2022   furosemide (LASIX) 80 MG tablet Take 1 tablet (80 mg total) by mouth daily. 60 tablet 0 01/22/2022   insulin glargine (LANTUS) 100 UNIT/ML Solostar Pen Inject 10 Units into the skin at bedtime. 9 mL 3 01/22/2022   isosorbide mononitrate (IMDUR) 60 MG 24 hr tablet Take 1 tablet (60 mg total) by mouth daily. 30 tablet 0 01/22/2022   metolazone (ZAROXOLYN) 5 MG tablet Take 5 mg by mouth 2 (two) times daily.   01/22/2022   Naphazoline HCl (CLEAR EYES OP) Place 1 drop into both eyes as needed (irritation).   unk   polyethylene glycol powder (GLYCOLAX/MIRALAX) 17 GM/SCOOP powder Take 17 g by mouth 2 (two) times daily as needed. (Patient taking differently: Take 17 g by mouth 2 (two) times daily as needed for mild constipation.) 3350 g 1 unk   pregabalin (LYRICA) 50 MG capsule Take 50 mg by mouth 2 (two) times daily.   01/22/2022     Vitals   Vitals:   01/25/22 0200 01/25/22 0300 01/25/22 0400 01/25/22 0432  BP:    (!) 145/98  Pulse:      Resp: 16 19 13  18  Temp:    98.1 F (36.7 C)  TempSrc:    Oral  SpO2:      Weight:      Height:         Body mass index is 29.99 kg/m.  Physical Exam   General: Laying comfortably in bed; in no acute distress.  HENT: Normal oropharynx and mucosa. Normal external appearance of ears and nose.  Neck: Supple, no pain or tenderness  CV: No JVD. No peripheral edema.  Pulmonary: Symmetric Chest rise. Normal respiratory effort.  Abdomen: Soft to touch, non-tender.  Ext: No cyanosis, edema, or deformity  Skin: No rash. Normal palpation of skin.   Musculoskeletal: Normal digits and nails by inspection. No clubbing.   Neurologic Examination  Mental status/Cognition: Alert, oriented to self, place, month and year, good attention.  Speech/language: Fluent, comprehension  intact, object naming intact, repetition intact.  Cranial nerves:   CN II Pupils are equal and round.  Right pupil is reactive to bright light.  No response to bright light and left pupil.  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 Left BKA.  He has spontaneous and antigravity movement in all extremities.  He can hold both of his arms up above the bed for more than 10 seconds and can hold both of his legs up above the bed for more than 5 seconds.  Sensation:  Light touch Intact bilaterally with neuropathy in bilateral lower extremities.   Pin prick    Temperature    Vibration   Proprioception    Coordination/Complex Motor:  - Finger to Nose intact BL  Labs   CBC:  Recent Labs  Lab 01/23/22 0309 01/24/22 0141 01/25/22 0433  WBC 5.2 5.3 4.2  NEUTROABS 3.7  --   --   HGB 12.2* 9.8* 9.4*  HCT 40.4 31.8* 30.7*  MCV 90.6 87.6 89.2  PLT 166 157 144*    Basic Metabolic Panel:  Lab Results  Component Value Date   NA 140 01/24/2022   K 4.1 01/24/2022   CO2 24 01/24/2022   GLUCOSE 128 (H) 01/24/2022   BUN 39 (H) 01/24/2022   CREATININE 4.20 (H) 01/24/2022   CALCIUM 7.3 (L) 01/24/2022   GFRNONAA 16 (L) 01/24/2022   GFRAA 76 05/23/2020   Lipid Panel:  Lab Results  Component Value Date   LDLCALC 40 05/21/2021   HgbA1c:  Lab Results  Component Value Date   HGBA1C 7.2 (H) 02/27/2021   Urine Drug Screen:     Component Value Date/Time   LABOPIA NONE DETECTED 02/27/2021 0813   COCAINSCRNUR NONE DETECTED 02/27/2021 0813   LABBENZ  NONE DETECTED 02/27/2021 0813   AMPHETMU NONE DETECTED 02/27/2021 0813   THCU NONE DETECTED 02/27/2021 0813   LABBARB NONE DETECTED 02/27/2021 0813    Alcohol Level No results found for: "ETH"  CT Head without contrast(Personally reviewed): Negative for large stroke or ICH.  High density subretinal effusion on the left concerning for potential left retinal  detachment.  Unclear if this is new.  Impression   KNOWLEDGE ESCANDON is a 51 y.o. 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  His heparin was discontinued yesterday afternoon.  He was last seen at his baseline at 0300 on 01/25/2022.  He reported to his RN that he woke up and unable to see anything out of his left eye. However changed the story to deny any acute vision changes when discussed that his chart mentions L eye blindness atleast since May 2023. Reports gradual worsening of R eye vision and this is not an abrupt change tonight. Overall, low suspicion for CRAO or acute stroke. CTH does seem to show subretinal effusion on the left concerning for L eye retinal detachment and I would recommend discussion with ophthalmology. If optho sees findings concerning for a CRAO on exam, please get [place] on board for full stroke workup. At this time, we will signoff. Recommendations   Overall, low suspicion for CRAO or acute stroke. CTH does seem to show subretinal effusion on the left concerning for L eye retinal detachment and I would recommend discussion with ophthalmology. If optho sees findings concerning for a CRAO on exam, please get [place] on board for full stroke workup. At this time, we will signoff.  Discussed with Dr. Hal Hope over  phone. ______________________________________________________________________   Thank you for the opportunity to take part in the care of this patient. If you have any further questions, please contact the neurology consultation attending.  Signed,  [place] Pager Number [phone] _ _ _   _ __   _ __ _ _  __ __   _ __   __ _

## 2022-01-25 NOTE — Consult Note (Signed)
[facility] Renal Consultation Note  Requesting MD: Ghimire Indication for Consultation: A on CRT vs progression on CKD  HPI:  Todd Mccoy is a 51 y.o. male with obesity, HTN, DM, systolic and diastolic HF and stage 4 CKD-  crt in the mid to high 2's for the last year-   followed by [facility] previously Dr. Justin Mend but has not seen a new MD just yet-  but crt of 3.82 on 01/09/22.  He presented to the hospital on 7/22 with complaint of CP-  had very high BP, crt 4.1 and elevated troponin.  -  also felt to be volume overloaded-  he was diuresed-  BP brought down pretty aggressively to low of SBP 130-  today had developed visual change-  code stroke was activated but now felt to maybe be an opthalm issue -  cards has seen-  they are not considering further work up-  crt 4.3 today so we were consulted regarding diuretic management ? Patient is not complaining of anything at present-  says his vision is not bothering him.  Later in our interaction he tells me that he would like to leave AMA because nothing is being done.  He denies nausea or appetite disturbance or weight loss-  but albumin is less than 2.  He says that he was out of lasix for a time but got it 2 weeks ago-  had edema but says that it is much better now.  Creatinine, Ser  Date/Time Value Ref Range Status  01/25/2022 04:33 AM 4.30 (H) 0.61 - 1.24 mg/dL Final  01/24/2022 01:41 AM 4.20 (H) 0.61 - 1.24 mg/dL Final  01/23/2022 03:09 AM 4.10 (H) 0.61 - 1.24 mg/dL Final  01/09/2022 01:57 PM 3.82 (H) 0.61 - 1.24 mg/dL Final  05/21/2021 10:57 AM 2.48 (H) 0.76 - 1.27 mg/dL Final  03/06/2021 04:41 AM 2.75 (H) 0.61 - 1.24 mg/dL Final  03/05/2021 04:56 AM 2.57 (H) 0.61 - 1.24 mg/dL Final  03/04/2021 04:54 AM 2.76 (H) 0.61 - 1.24 mg/dL Final  03/04/2021 04:54 AM 2.78 (H) 0.61 - 1.24 mg/dL Final  03/03/2021 04:59 AM 2.44 (H) 0.61 - 1.24 mg/dL Final  03/03/2021 04:59 AM 2.69 (H) 0.61 - 1.24 mg/dL Final  03/02/2021 04:28 AM 2.56 (H) 0.61  - 1.24 mg/dL Final  03/01/2021 05:17 AM 2.67 (H) 0.61 - 1.24 mg/dL Final  02/28/2021 05:36 AM 2.48 (H) 0.61 - 1.24 mg/dL Final  02/27/2021 06:22 AM 2.50 (H) 0.61 - 1.24 mg/dL Final  02/27/2021 06:22 AM 2.64 (H) 0.61 - 1.24 mg/dL Final  02/17/2021 06:00 AM 2.53 (H) 0.61 - 1.24 mg/dL Final  02/11/2021 05:40 PM 2.44 (H) 0.61 - 1.24 mg/dL Final  01/21/2021 02:38 PM 2.56 (H) 0.76 - 1.27 mg/dL Final  12/22/2020 01:29 AM 3.26 (H) 0.61 - 1.24 mg/dL Final  12/21/2020 12:34 AM 3.32 (H) 0.61 - 1.24 mg/dL Final  12/20/2020 06:09 AM 3.26 (H) 0.61 - 1.24 mg/dL Final  12/19/2020 04:39 PM 3.37 (H) 0.61 - 1.24 mg/dL Final  12/19/2020 01:23 AM 3.41 (H) 0.61 - 1.24 mg/dL Final  12/18/2020 12:57 AM 3.15 (H) 0.61 - 1.24 mg/dL Final  12/17/2020 05:39 AM 3.08 (H) 0.61 - 1.24 mg/dL Final  12/16/2020 01:00 AM 3.09 (H) 0.61 - 1.24 mg/dL Final  12/15/2020 02:04 AM 3.29 (H) 0.61 - 1.24 mg/dL Final  12/14/2020 01:04 AM 3.10 (H) 0.61 - 1.24 mg/dL Final  12/13/2020 12:57 AM 3.24 (H) 0.61 - 1.24 mg/dL Final  12/12/2020 01:50 AM 3.36 (H) 0.61 -  1.24 mg/dL Final  12/11/2020 02:36 AM 3.25 (H) 0.61 - 1.24 mg/dL Final  11/14/2020 02:56 AM 1.54 (H) 0.61 - 1.24 mg/dL Final  11/13/2020 02:22 AM 1.69 (H) 0.61 - 1.24 mg/dL Final  11/12/2020 03:40 PM 1.84 (H) 0.61 - 1.24 mg/dL Final  10/22/2020 09:44 AM 1.35 (H) 0.76 - 1.27 mg/dL Final  05/23/2020 11:44 AM 1.29 (H) 0.76 - 1.27 mg/dL Final  04/17/2020 05:16 PM 1.33 (H) 0.76 - 1.27 mg/dL Final  03/27/2020 05:36 AM 1.14 0.61 - 1.24 mg/dL Final  03/26/2020 08:35 AM 1.25 (H) 0.61 - 1.24 mg/dL Final  03/25/2020 04:36 AM 1.20 0.61 - 1.24 mg/dL Final  03/24/2020 10:23 PM 1.27 (H) 0.61 - 1.24 mg/dL Final  03/19/2020 05:05 AM 1.39 (H) 0.61 - 1.24 mg/dL Final  11/07/2019 05:39 PM 1.16 0.61 - 1.24 mg/dL Final  04/08/2016 06:06 AM 0.65 0.61 - 1.24 mg/dL Final  09/10/2014 04:57 AM 0.75 0.50 - 1.35 mg/dL Final  09/08/2014 04:49 AM 0.83 0.50 - 1.35 mg/dL Final  09/07/2014 03:45 AM 0.67  0.50 - 1.35 mg/dL Final  09/06/2014 07:01 PM 0.85 0.50 - 1.35 mg/dL Final  06/19/2014 06:32 PM 0.76 0.50 - 1.35 mg/dL Final     PMHx:   Past Medical History:  Diagnosis Date   Anemia    Anemia    Anxiety    Chronic combined systolic (congestive) and diastolic (congestive) heart failure (HCC)    EF 40-45% with G3DD on echo 2022   CKD (chronic kidney disease), stage IV ([place]) 12/10/2020   Diabetes mellitus with complication (HCC)    Diabetic neuropathy (HCC)    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[place]) 03/25/2020   pt states retinal eye damage- left worse than the right- recent visited MD    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[place]) 03/25/2020   pt states recent MD visit /left eye worse than right   Dyslipidemia 12/10/2020   Hx of BKA, left ([place])    Hypertension    Morbid obesity ([place])    Osteomyelitis ([place])    Pneumonia     Past Surgical History:  Procedure Laterality Date   ABSCESS DRAINAGE     neck   AMPUTATION Left 11/14/2020   Procedure: LEFT 5TH RAY AMPUTATION;  Surgeon: Newt Minion, MD;  Location: [facility];  Service: Orthopedics;  Laterality: Left;   AMPUTATION Left 12/10/2020   Procedure: LEFT BELOW KNEE AMPUTATION;  Surgeon: Newt Minion, MD;  Location: [facility];  Service: Orthopedics;  Laterality: Left;   AMPUTATION TOE Right 03/25/2020   Procedure: AMPUTATION TOE;  Surgeon: Evelina Bucy, DPM;  Location: [facility];  Service: Podiatry;  Laterality: Right;   INCISION AND DRAINAGE ABSCESS Right 09/07/2014   Procedure: INCISION AND DRAINAGE ABSCESS RIGHT FLANK;  Surgeon: Jackolyn Confer, MD;  Location: [facility];  Service: General;  Laterality: Right;   LEG AMPUTATION BELOW KNEE Left 12/10/2020   SCROTUM EXPLORATION      Family Hx:  Family History  Problem Relation Age of Onset   Diabetes Mother    Diabetes Father    Diabetes Brother    Colon cancer Neg Hx    Esophageal cancer Neg Hx    Pancreatic cancer Neg Hx    Stomach cancer Neg Hx    Liver disease Neg Hx     CAD Neg Hx     Social History:  reports that he has been smoking cigarettes. He has a 32.00 pack-year smoking history. He has been exposed to tobacco smoke.  He has quit using smokeless tobacco. He reports that he does not drink alcohol and does not use drugs.  Allergies: No Known Allergies  Medications: Prior to Admission medications   Medication Sig Start Date End Date Taking? Authorizing Provider  acetaminophen (TYLENOL) 325 MG tablet Take 650 mg by mouth every 6 (six) hours as needed for mild pain.   Yes [provider]  acetaminophen-codeine (TYLENOL #3) 300-30 MG tablet Take 1 tablet by mouth every 12 (twelve) hours as needed for moderate pain. 01/19/22  Yes Dondra Prader R, NP  amLODipine (NORVASC) 10 MG tablet Take 1 tablet (10 mg total) by mouth daily. 01/19/22  Yes Turner, Eber Hong, MD  atorvastatin (LIPITOR) 20 MG tablet Take 1 tablet (20 mg total) by mouth daily. 04/23/21  Yes Turner, Eber Hong, MD  carvedilol (COREG) 25 MG tablet Take 1 tablet (25 mg total) by mouth 2 (two) times daily. 05/21/21  Yes Turner, Eber Hong, MD  ferrous sulfate 325 (65 FE) MG tablet Take 1 tablet (325 mg total) by mouth 3 (three) times daily with meals. Patient taking differently: Take 325 mg by mouth daily with breakfast. 12/19/20 01/23/22 Yes Shahmehdi, Valeria Batman, MD  furosemide (LASIX) 80 MG tablet Take 1 tablet (80 mg total) by mouth daily. 01/19/22  Yes Turner, Eber Hong, MD  insulin glargine (LANTUS) 100 UNIT/ML Solostar Pen Inject 10 Units into the skin at bedtime. 03/06/21 03/06/22 Yes Kathie Dike, MD  isosorbide mononitrate (IMDUR) 60 MG 24 hr tablet Take 1 tablet (60 mg total) by mouth daily. 01/19/22  Yes Turner, Eber Hong, MD  metolazone (ZAROXOLYN) 5 MG tablet Take 5 mg by mouth 2 (two) times daily. 01/08/22  Yes [provider]  Naphazoline HCl (CLEAR EYES OP) Place 1 drop into both eyes as needed (irritation).   Yes [provider]  polyethylene glycol powder (GLYCOLAX/MIRALAX) 17  GM/SCOOP powder Take 17 g by mouth 2 (two) times daily as needed. Patient taking differently: Take 17 g by mouth 2 (two) times daily as needed for mild constipation. 01/21/21  Yes [name], MD  pregabalin (LYRICA) 50 MG capsule Take 50 mg by mouth 2 (two) times daily. 11/08/21  Yes [provider]  insulin aspart protamine- aspart (NOVOLOG MIX 70/30) (70-30) 100 UNIT/ML injection Inject 0.35 mLs (35 Units total) into the skin 2 (two) times daily. 04/17/20 05/13/20  Argentina Donovan, PA-C  losartan-hydrochlorothiazide (HYZAAR) 100-25 MG tablet TAKE 1 TABLET BY MOUTH DAILY. 12/19/20 12/22/20  Shahmehdi, Valeria Batman, MD  metFORMIN (GLUCOPHAGE-XR) 500 MG 24 hr tablet TAKE 1 TABLET (500 MG TOTAL) BY MOUTH 2 (TWO) TIMES DAILY. 12/19/20 12/22/20  Deatra James, MD    I have reviewed the patient's current medications.  Labs:  Results for orders placed or performed during the hospital encounter of 01/23/22 (from the past 48 hour(s))  CBG monitoring, ED     Status: None   Collection Time: 01/23/22  4:32 PM  Result Value Ref Range   Glucose-Capillary 96 70 - 99 mg/dL    Comment: Glucose reference range applies only to samples taken after fasting for at least 8 hours.  Glucose, capillary     Status: Abnormal   Collection Time: 01/23/22 10:13 PM  Result Value Ref Range   Glucose-Capillary 101 (H) 70 - 99 mg/dL    Comment: Glucose reference range applies only to samples taken after fasting for at least 8 hours.  CBC     Status: Abnormal   Collection Time: 01/24/22  1:41 AM  Result Value Ref Range   WBC 5.3 4.0 - 10.5 K/uL   RBC 3.63 (L) 4.22 - 5.81 MIL/uL   Hemoglobin 9.8 (L) 13.0 - 17.0 g/dL   HCT 31.8 (L) 39.0 - 52.0 %   MCV 87.6 80.0 - 100.0 fL   MCH 27.0 26.0 - 34.0 pg   MCHC 30.8 30.0 - 36.0 g/dL   RDW 15.2 11.5 - 15.5 %   Platelets 157 150 - 400 K/uL   nRBC 0.0 0.0 - 0.2 %    Comment: Performed at [facility], [place] [address], [place], [place] [address]  Magnesium      Status: Abnormal   Collection Time: 01/24/22  1:41 AM  Result Value Ref Range   Magnesium 1.3 (L) 1.7 - 2.4 mg/dL    Comment: Performed at [facility] [address] [place], [place] [address]  Renal function panel     Status: Abnormal   Collection Time: 01/24/22  1:41 AM  Result Value Ref Range   Sodium 140 135 - 145 mmol/L   Potassium 4.1 3.5 - 5.1 mmol/L   Chloride 109 98 - 111 mmol/L   CO2 24 22 - 32 mmol/L   Glucose, Bld 128 (H) 70 - 99 mg/dL    Comment: Glucose reference range applies only to samples taken after fasting for at least 8 hours.   BUN 39 (H) 6 - 20 mg/dL   Creatinine, Ser 4.20 (H) 0.61 - 1.24 mg/dL   Calcium 7.3 (L) 8.9 - 10.3 mg/dL   Phosphorus 3.7 2.5 - 4.6 mg/dL   Albumin 1.7 (L) 3.5 - 5.0 g/dL   GFR, Estimated 16 (L) >60 mL/min    Comment: (NOTE) Calculated using the CKD-EPI Creatinine Equation (2021)    Anion gap 7 5 - 15    Comment: Performed at [facility] [address] [place], [place] [address]  Heparin level (unfractionated)     Status: Abnormal   Collection Time: 01/24/22  1:41 AM  Result Value Ref Range   Heparin Unfractionated <0.10 (L) 0.30 - 0.70 IU/mL    Comment: (NOTE) The clinical reportable range upper limit is being lowered to >1.10 to align with the FDA approved guidance for the current laboratory assay.  If heparin results are below expected values, and patient dosage has  been confirmed, suggest follow up testing of antithrombin III levels. Performed at [facility], [place] [address] [place], [place] [address]   Glucose, capillary     Status: Abnormal   Collection Time: 01/24/22  9:34 AM  Result Value Ref Range   Glucose-Capillary 146 (H) 70 - 99 mg/dL    Comment: Glucose reference range applies only to samples taken after fasting for at least 8 hours.  Heparin level (unfractionated)     Status: Abnormal   Collection Time: 01/24/22 10:50 AM  Result Value Ref Range   Heparin Unfractionated <0.10 (L) 0.30 -  0.70 IU/mL    Comment: (NOTE) The clinical reportable range upper limit is being lowered to >1.10 to align with the FDA approved guidance for the current laboratory assay.  If heparin results are below expected values, and patient dosage has  been confirmed, suggest follow up testing of antithrombin III levels. Performed at [facility], [place] [address] [place], [place] [address]   Glucose, capillary     Status: Abnormal   Collection Time: 01/24/22 11:48 AM  Result Value Ref Range   Glucose-Capillary  129 (H) 70 - 99 mg/dL    Comment: Glucose reference range applies only to samples taken after fasting for at least 8 hours.  Glucose, capillary     Status: Abnormal   Collection Time: 01/24/22  5:37 PM  Result Value Ref Range   Glucose-Capillary 135 (H) 70 - 99 mg/dL    Comment: Glucose reference range applies only to samples taken after fasting for at least 8 hours.  Glucose, capillary     Status: Abnormal   Collection Time: 01/24/22  9:33 PM  Result Value Ref Range   Glucose-Capillary 208 (H) 70 - 99 mg/dL    Comment: Glucose reference range applies only to samples taken after fasting for at least 8 hours.  Glucose, capillary     Status: Abnormal   Collection Time: 01/25/22  4:30 AM  Result Value Ref Range   Glucose-Capillary 121 (H) 70 - 99 mg/dL    Comment: Glucose reference range applies only to samples taken after fasting for at least 8 hours.  CBC     Status: Abnormal   Collection Time: 01/25/22  4:33 AM  Result Value Ref Range   WBC 4.2 4.0 - 10.5 K/uL   RBC 3.44 (L) 4.22 - 5.81 MIL/uL   Hemoglobin 9.4 (L) 13.0 - 17.0 g/dL   HCT 30.7 (L) 39.0 - 52.0 %   MCV 89.2 80.0 - 100.0 fL   MCH 27.3 26.0 - 34.0 pg   MCHC 30.6 30.0 - 36.0 g/dL   RDW 15.3 11.5 - 15.5 %   Platelets 144 (L) 150 - 400 K/uL   nRBC 0.0 0.0 - 0.2 %    Comment: Performed at [facility], [place] [address] , [place] [address]  Magnesium     Status: Abnormal   Collection Time: 01/25/22   4:33 AM  Result Value Ref Range   Magnesium 1.6 (L) 1.7 - 2.4 mg/dL    Comment: Performed at [facility] [address], [place], [place] [address]  Renal function panel     Status: Abnormal   Collection Time: 01/25/22  4:33 AM  Result Value Ref Range   Sodium 137 135 - 145 mmol/L   Potassium 4.1 3.5 - 5.1 mmol/L   Chloride 106 98 - 111 mmol/L   CO2 22 22 - 32 mmol/L   Glucose, Bld 119 (H) 70 - 99 mg/dL    Comment: Glucose reference range applies only to samples taken after fasting for at least 8 hours.   BUN 42 (H) 6 - 20 mg/dL   Creatinine, Ser 4.30 (H) 0.61 - 1.24 mg/dL   Calcium 7.5 (L) 8.9 - 10.3 mg/dL   Phosphorus 3.7 2.5 - 4.6 mg/dL   Albumin 1.7 (L) 3.5 - 5.0 g/dL   GFR, Estimated 16 (L) >60 mL/min    Comment: (NOTE) Calculated using the CKD-EPI Creatinine Equation (2021)    Anion gap 9 5 - 15    Comment: Performed at [facility] [address], [place], [place] [address]  Glucose, capillary     Status: Abnormal   Collection Time: 01/25/22  7:59 AM  Result Value Ref Range   Glucose-Capillary 102 (H) 70 - 99 mg/dL    Comment: Glucose reference range applies only to samples taken after fasting for at least 8 hours.  Glucose, capillary     Status: Abnormal   Collection Time: 01/25/22 12:20 PM  Result Value Ref Range   Glucose-Capillary 118 (H) 70 - 99 mg/dL  Comment: Glucose reference range applies only to samples taken after fasting for at least 8 hours.     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  I suspect due to DM and HTN not well controlled.  He has significant proteinuria which puts him at risk for progression.  He now presents with CP and hypertensive urgency-  fortunately his GFR is not that different from baseline-  there are no indications for dialysis -  would suspect some slight change in the setting of hypertensive urgency with relatively rapid correction-  would aim for BP right where it is now-  140's-160's and hopefully it will settle out.  Pt does not seem to want to stay for some reason-  is thinking about signing out AMA.  Has hypoalbuminemia  and much proteinuria but not able to initiate anything like ACE/ARB or SGLT2 at this time-  possibly as OP if function stabilizes  2. Hypertension/volume  - BP much better now than on admit-  I think the relatively rapid BP lowering led to his vision changes maybe-  continue home norvasc and coreg.  Would resume lasix at discharge 80 per day  3.  Anemia  - not a major issue  4. Dispo-  It might be beneficial to have him stay til all parameters stable but I think that will be a tough sell-  I will make sure he has OP follow up with [facility] 01/25/2022, 2:31 PM

## 2022-01-25 NOTE — Progress Notes (Signed)
Patient called out to RN at 0420 hrs stating repeatedly he "couldn't see anything" and was asking if the "lights were out" (the room was well lit). Dr. Hal Hope paged to the bedside by this RN. VS and CBG assessed by this RN. Code Stroke initiated by Dr. Hal Hope. LKW is 0300 hrs. Patient transported to CT and back by this RN, Rapid Response RN, and Development worker, community. No TNK per Neurology. Patient returned to 6E24.

## 2022-01-25 NOTE — Significant Event (Addendum)
Rapid Response Event Note   Reason for Call :  Visual changes. LSN-0300  Initial Focused Assessment:  Pt sitting up on side of bed in no visible distress. He is alert and oriented c/o visual changes to his L eye. Per pt, vision is poor at baseline. NIH-2 for L hemianopia  T-98.1, HR-74, BP-145/98, RR-18, SpO2-98% on RA.   Interventions:  CBG-121 Code Stroke initiated at 3241 Plan of Care:  Code Stroke initiated-see Code Stroke note for details. Please complete yale swallow screen prior to giving pt anything PO. Plan for Eye doctor consult. Please call RRT if further assistance needed.   Event Summary:   MD Notified: Dr. Hal Hope at bedside on RRT arrival, Dr. Lorrin Goodell consulted and to bedside Call [place] End Time:0510  Dillard Essex, RN

## 2022-01-25 NOTE — Progress Notes (Signed)
PROGRESS NOTE    Todd Mccoy  TFT:[id number] DOB: January 29, 1971 DOA: 01/23/2022 PCP: [name], MD    Brief Narrative:  51 year old with history of hypertension, hyperlipidemia, chronic combined heart failure with known ejection fraction of 40% and grade 3 diastolic dysfunction, type 2 diabetes on insulin, CKD stage IV presented with left-sided chest pain, gradually worsening shortness of breath that he attributed to missing dose of Lyrica.  In the emergency room, afebrile.  Tachycardic and tachypneic.  Blood pressure 204/134 and on room air.  Creatinine 4.1, BNP 2000.  Troponin is 250.  Chest x-ray with cardiomegaly with pulmonary vascular congestion.  Patient was given aspirin, heparin infusion, dose of Lasix and admitted to the hospital.  Seen by cardiology. Significant event: 7/24 morning patient complained of sudden loss of vision, however on further questioning he is having vision loss for more than 3 months and was seen by ophthalmology as outpatient. Remains in the hospital with abnormal kidney functions.   Assessment & Plan:   Chest pain: Atypical chest pain.  Serial troponins remain flat.  Currently pain improved.  Acute coronary syndrome ruled out.  Continue aspirin and statin.  Heparin discontinued.  Acute on chronic combined heart failure: Currently off medications at home and recently resumed.  Has a significant renal compromise. Good response to IV diuretics since yesterday. Continue.  Intake output monitoring.  Daily weight.  Low salt and low fluid intake. Patient is already on carvedilol and Imdur.  Continue.  Followed by cardiology.  Acute kidney injury superimposed on chronic kidney disease stage IV: Previously reported baseline creatinine about 2.4-2.7.  This is likely in the setting of congestive heart failure exacerbation. Creatinine 4.1-4.2-4.3. urine output is adequate.  Discussed with nephrology for consultation.  Essential hypertension: Accelerated blood  pressure on presentation.  Currently improved with amlodipine, Coreg and Imdur.  Monitor.  Hypomagnesemia: Replaced.  We will replace further today.  He is still making adequate urine so should not be a problem replacing magnesium.  Type 2 diabetes with hyperglycemia: Continue home doses of insulin.  A1c pending.  Left BKA, right second toe amputation: Stable.  Smoker: Continues to smoke.  Counseled.  Nicotine patch.   DVT prophylaxis:   SCDs   Code Status: Full code Family Communication: None Disposition Plan: Status is: Inpatient Remains inpatient appropriate because: Significant renal abnormalities, diuresis     Consultants:  Cardiology  Procedures:  None  Antimicrobials:  None   Subjective:  Patient seen and examined.  Complains of mild generalized headache.  He called out last night with sudden loss of vision, however he tells me that this has been his problem for last many months. He cannot see anything from the left eye.  He has some vision with waving of hands on the right eye. Urine output 1250 mL last 24 hours.  Denies any chest pain or shortness of breath today.  Objective: Vitals:   01/25/22 0500 01/25/22 0600 01/25/22 0757 01/25/22 1217  BP:   (!) 142/97 (!) 146/95  Pulse:   78 79  Resp: (!) 21 10 11 11   Temp:   97.9 F (36.6 C) 98.1 F (36.7 C)  TempSrc:   Oral Oral  SpO2:      Weight:  94.1 kg    Height:        Intake/Output Summary (Last 24 hours) at 01/25/2022 1456 Last data filed at 01/25/2022 0600 Gross per 24 hour  Intake 770.13 ml  Output 1250 ml  Net -479.87 ml  Filed Weights   01/23/22 2157 01/24/22 0459 01/25/22 0600  Weight: 94.8 kg 94.8 kg 94.1 kg    Examination:  General exam: Appears calm and comfortable  Anxious.  On room air. Blind in the left eye. Waving of fingers present on right eye. Respiratory system: Mostly bilateral clear.  Fine crackles at bases. Cardiovascular system: S1 & S2 heard, RRR.  Trace bilateral  pedal edema. Gastrointestinal system: Abdomen is nondistended, soft and nontender. No organomegaly or masses felt. Normal bowel sounds heard. Central nervous system: Alert and oriented. No focal neurological deficits. Extremities:  Left BKA stump clean and dry.  Right second toe amputation clean and dry.    Data Reviewed: I have personally reviewed following labs and imaging studies  CBC: Recent Labs  Lab 01/23/22 0309 01/24/22 0141 01/25/22 0433  WBC 5.2 5.3 4.2  NEUTROABS 3.7  --   --   HGB 12.2* 9.8* 9.4*  HCT 40.4 31.8* 30.7*  MCV 90.6 87.6 89.2  PLT 166 157 315*   Basic Metabolic Panel: Recent Labs  Lab 01/23/22 0309 01/24/22 0141 01/25/22 0433  NA 134* 140 137  K 4.2 4.1 4.1  CL 105 109 106  CO2 23 24 22   GLUCOSE 151* 128* 119*  BUN 38* 39* 42*  CREATININE 4.10* 4.20* 4.30*  CALCIUM 7.5* 7.3* 7.5*  MG  --  1.3* 1.6*  PHOS  --  3.7 3.7   GFR: Estimated Creatinine Clearance: 23.9 mL/min (A) (by C-G formula based on SCr of 4.3 mg/dL (H)). Liver Function Tests: Recent Labs  Lab 01/24/22 0141 01/25/22 0433  ALBUMIN 1.7* 1.7*   No results for input(s): "LIPASE", "AMYLASE" in the last 168 hours. No results for input(s): "AMMONIA" in the last 168 hours. Coagulation Profile: No results for input(s): "INR", "PROTIME" in the last 168 hours. Cardiac Enzymes: No results for input(s): "CKTOTAL", "CKMB", "CKMBINDEX", "TROPONINI" in the last 168 hours. BNP (last 3 results) No results for input(s): "PROBNP" in the last 8760 hours. HbA1C: No results for input(s): "HGBA1C" in the last 72 hours. CBG: Recent Labs  Lab 01/24/22 1737 01/24/22 2133 01/25/22 0430 01/25/22 0759 01/25/22 1220  GLUCAP 135* 208* 121* 102* 118*   Lipid Profile: No results for input(s): "CHOL", "HDL", "LDLCALC", "TRIG", "CHOLHDL", "LDLDIRECT" in the last 72 hours. Thyroid Function Tests: No results for input(s): "TSH", "T4TOTAL", "FREET4", "T3FREE", "THYROIDAB" in the last 72  hours. Anemia Panel: No results for input(s): "VITAMINB12", "FOLATE", "FERRITIN", "TIBC", "IRON", "RETICCTPCT" in the last 72 hours. Sepsis Labs: No results for input(s): "PROCALCITON", "LATICACIDVEN" in the last 168 hours.  No results found for this or any previous visit (from the past 240 hour(s)).       Radiology Studies: CT HEAD CODE STROKE WO CONTRAST  Result Date: 01/25/2022 CLINICAL DATA:  Code stroke.  Loss of vision EXAM: CT HEAD WITHOUT CONTRAST TECHNIQUE: Contiguous axial images were obtained from the base of the skull through the vertex without intravenous contrast. RADIATION DOSE REDUCTION: This exam was performed according to the departmental dose-optimization program which includes automated exposure control, adjustment of the mA and/or kV according to patient size and/or use of iterative reconstruction technique. COMPARISON:  None Available. FINDINGS: Brain: No evidence of acute infarction, hemorrhage, hydrocephalus, extra-axial collection or mass lesion/mass effect. 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 Vascular: No hyperdense vessel or unexpected calcification. Skull: Normal. Negative for fracture or focal lesion. Sinuses/Orbits: Oil tamponade in the right globe. High-density subretinal appearance in the posterior left lobe. Other: These results were  called by telephone at the time of interpretation on 01/25/2022 at 5:08 am to provider [facility] , who verbally acknowledged these results. ASPECTS [facility] Stroke Program Early CT Score) Not scored with this history IMPRESSION: 1. High-density sub retinal effusion on the left. 2. Oil tamponade in the right globe. 3. No acute intracranial finding. Electronically Signed   By: Jorje Guild M.D.   On: 01/25/2022 05:09   ECHOCARDIOGRAM COMPLETE  Result Date: 01/23/2022    ECHOCARDIOGRAM REPORT   Patient Name:   ALBAN MARUCCI Date of Exam: 01/23/2022 Medical Rec #:  [id number]           Height:       70.0 in  Accession #:    [id number]          Weight:       190.0 lb Date of Birth:  1970-09-27           BSA:          2.042 m Patient Age:    25 years            BP:           152/101 mmHg Patient Gender: M                   HR:           67 bpm. Exam Location:  Inpatient Procedure: 2D Echo, 3D Echo, Cardiac Doppler, Color Doppler and Strain Analysis Indications:    CHF  History:        Patient has prior history of Echocardiogram examinations, most                 recent 02/27/2021. Signs/Symptoms:Edema; Risk                 Factors:Hypertension. ESRD.  Sonographer:    Merrie Roof RDCS Referring Phys: Norval Morton  Sonographer Comments: Global longitudinal strain was attempted. IMPRESSIONS  1. Left ventricular ejection fraction by 3D volume is 38 %. The left ventricle has moderate to severely decreased function. The left ventricle demonstrates global hypokinesis. There is severe left ventricular hypertrophy. Left ventricular diastolic parameters are consistent with Grade III diastolic dysfunction (restrictive). The average left ventricular global longitudinal strain is -8.8 %. The global longitudinal strain is abnormal. Strain pattern consistent with apical sparing, consider an evaluation for cardiac amyloidosis if clinically relevant.  2. Right ventricular systolic function is moderately reduced. The right ventricular size is mildly enlarged. Moderately increased right ventricular wall thickness. There is normal pulmonary artery systolic pressure. The estimated right ventricular systolic pressure is 09.7 mmHg.  3. Left atrial size was severely dilated.  4. A small pericardial effusion is present. There is no evidence of cardiac tamponade.  5. The mitral valve is grossly normal. Mild mitral valve regurgitation. No evidence of mitral stenosis.  6. The aortic valve is tricuspid. There is mild thickening of the aortic valve. Aortic valve regurgitation is not visualized. No aortic stenosis is present.  7. The inferior vena  cava is normal in size with greater than 50% respiratory variability, suggesting right atrial pressure of 3 mmHg. FINDINGS  Left Ventricle: Left ventricular ejection fraction by 3D volume is 38 %. The left ventricle has moderate to severely decreased function. The left ventricle demonstrates global hypokinesis. The average left ventricular global longitudinal strain is -8.8 %. The global longitudinal strain is abnormal. The left ventricular internal cavity size was normal in size. There is severe left ventricular hypertrophy.  Left ventricular diastolic parameters are consistent with Grade III diastolic dysfunction (restrictive). Right Ventricle: The right ventricular size is mildly enlarged. Moderately increased right ventricular wall thickness. Right ventricular systolic function is moderately reduced. There is normal pulmonary artery systolic pressure. The tricuspid regurgitant velocity is 2.13 m/s, and with an assumed right atrial pressure of 3 mmHg, the estimated right ventricular systolic pressure is 82.9 mmHg. Left Atrium: Left atrial size was severely dilated. Right Atrium: Right atrial size was normal in size. Pericardium: A small pericardial effusion is present. There is no evidence of cardiac tamponade. Mitral Valve: The mitral valve is grossly normal. There is mild thickening of the mitral valve leaflet(s). Mild mitral valve regurgitation. No evidence of mitral valve stenosis. Tricuspid Valve: The tricuspid valve is normal in structure. Tricuspid valve regurgitation is mild . No evidence of tricuspid stenosis. Aortic Valve: The aortic valve is tricuspid. There is mild thickening of the aortic valve. Aortic valve regurgitation is not visualized. No aortic stenosis is present. Pulmonic Valve: The pulmonic valve was normal in structure. Pulmonic valve regurgitation is mild. No evidence of pulmonic stenosis. Aorta: The aortic root is normal in size and structure. Venous: The inferior vena cava is normal in  size with greater than 50% respiratory variability, suggesting right atrial pressure of 3 mmHg. IAS/Shunts: No atrial level shunt detected by color flow Doppler.  LEFT VENTRICLE PLAX 2D LVIDd:         4.10 cm         Diastology LVIDs:         3.40 cm         LV e' medial:    4.35 cm/s LV PW:         1.60 cm         LV E/e' medial:  20.0 LV IVS:        2.00 cm         LV e' lateral:   4.90 cm/s LVOT diam:     2.20 cm         LV E/e' lateral: 17.8 LVOT Area:     3.80 cm                                2D                                Longitudinal                                Strain                                2D Strain GLS  -8.8 %                                Avg:                                 3D Volume EF                                LV 3D EF:  Left                                             ventricul                                             ar                                             ejection                                             fraction                                             by 3D                                             volume is                                             38 %.                                 3D Volume EF:                                3D EF:        38 %                                LV EDV:       171 ml                                LV ESV:       106 ml                                LV SV:        65 ml RIGHT VENTRICLE            IVC RV Basal diam:  4.50 cm    IVC diam: 2.10 cm RV Mid diam:    3.80 cm RV S prime:     6.42 cm/s TAPSE (M-mode): 0.7 cm LEFT ATRIUM              Index        RIGHT ATRIUM  Index LA diam:        5.30 cm  2.60 cm/m   RA Area:     19.50 cm LA Vol (A2C):   130.0 ml 63.65 ml/m  RA Volume:   61.10 ml  29.92 ml/m LA Vol (A4C):   91.0 ml  44.56 ml/m LA Biplane Vol: 112.0 ml 54.84 ml/m   AORTA Ao Root diam: 3.50 cm Ao Asc diam:  3.00 cm MITRAL VALVE               TRICUSPID VALVE MV Area (PHT): 4.80 cm    TR Peak grad:    18.1 mmHg MV Decel Time: 158 msec    TR Vmax:        213.00 cm/s MV E velocity: 87.20 cm/s MV A velocity: 44.20 cm/s  SHUNTS MV E/A ratio:  1.97        Systemic Diam: 2.20 cm Cherlynn Kaiser MD Electronically signed by Cherlynn Kaiser MD Signature Date/Time: 01/23/2022/5:30:29 PM    Final         Scheduled Meds:  amLODipine  10 mg Oral Daily   atorvastatin  20 mg Oral Daily   carvedilol  25 mg Oral BID   ferrous sulfate  325 mg Oral Q breakfast   insulin aspart  0-6 Units Subcutaneous TID WC   insulin glargine-yfgn  5 Units Subcutaneous QHS   isosorbide mononitrate  60 mg Oral Daily   pregabalin  50 mg Oral BID   sodium chloride flush  3 mL Intravenous Q12H   Continuous Infusions:  magnesium sulfate bolus IVPB       LOS: 2 days    Time spent: 35 minutes    Barb Merino, MD [facility] Pager [phone]

## 2022-01-26 ENCOUNTER — Inpatient Hospital Stay (HOSPITAL_COMMUNITY): Payer: Medicaid Other

## 2022-01-26 ENCOUNTER — Other Ambulatory Visit: Payer: Self-pay

## 2022-01-26 ENCOUNTER — Other Ambulatory Visit: Payer: Self-pay | Admitting: Cardiology

## 2022-01-26 DIAGNOSIS — I5041 Acute combined systolic (congestive) and diastolic (congestive) heart failure: Secondary | ICD-10-CM

## 2022-01-26 DIAGNOSIS — N179 Acute kidney failure, unspecified: Secondary | ICD-10-CM | POA: Diagnosis not present

## 2022-01-26 DIAGNOSIS — I5040 Unspecified combined systolic (congestive) and diastolic (congestive) heart failure: Secondary | ICD-10-CM | POA: Diagnosis not present

## 2022-01-26 DIAGNOSIS — N189 Chronic kidney disease, unspecified: Secondary | ICD-10-CM | POA: Diagnosis not present

## 2022-01-26 DIAGNOSIS — N289 Disorder of kidney and ureter, unspecified: Secondary | ICD-10-CM | POA: Diagnosis not present

## 2022-01-26 DIAGNOSIS — I161 Hypertensive emergency: Secondary | ICD-10-CM | POA: Diagnosis not present

## 2022-01-26 DIAGNOSIS — D649 Anemia, unspecified: Secondary | ICD-10-CM | POA: Diagnosis not present

## 2022-01-26 DIAGNOSIS — N184 Chronic kidney disease, stage 4 (severe): Secondary | ICD-10-CM | POA: Diagnosis not present

## 2022-01-26 DIAGNOSIS — R079 Chest pain, unspecified: Secondary | ICD-10-CM | POA: Diagnosis not present

## 2022-01-26 DIAGNOSIS — R778 Other specified abnormalities of plasma proteins: Secondary | ICD-10-CM | POA: Diagnosis not present

## 2022-01-26 DIAGNOSIS — I5023 Acute on chronic systolic (congestive) heart failure: Secondary | ICD-10-CM | POA: Diagnosis not present

## 2022-01-26 DIAGNOSIS — I160 Hypertensive urgency: Secondary | ICD-10-CM | POA: Diagnosis not present

## 2022-01-26 LAB — CBC
HCT: 29.2 % — ABNORMAL LOW (ref 39.0–52.0)
Hemoglobin: 9.4 g/dL — ABNORMAL LOW (ref 13.0–17.0)
MCH: 27.7 pg (ref 26.0–34.0)
MCHC: 32.2 g/dL (ref 30.0–36.0)
MCV: 86.1 fL (ref 80.0–100.0)
Platelets: 146 10*3/uL — ABNORMAL LOW (ref 150–400)
RBC: 3.39 MIL/uL — ABNORMAL LOW (ref 4.22–5.81)
RDW: 15.3 % (ref 11.5–15.5)
WBC: 5.3 10*3/uL (ref 4.0–10.5)
nRBC: 0 % (ref 0.0–0.2)

## 2022-01-26 LAB — RENAL FUNCTION PANEL
Albumin: 1.8 g/dL — ABNORMAL LOW (ref 3.5–5.0)
Anion gap: 7 (ref 5–15)
BUN: 44 mg/dL — ABNORMAL HIGH (ref 6–20)
CO2: 24 mmol/L (ref 22–32)
Calcium: 7.5 mg/dL — ABNORMAL LOW (ref 8.9–10.3)
Chloride: 106 mmol/L (ref 98–111)
Creatinine, Ser: 4.48 mg/dL — ABNORMAL HIGH (ref 0.61–1.24)
GFR, Estimated: 15 mL/min — ABNORMAL LOW (ref >60)
Glucose, Bld: 163 mg/dL — ABNORMAL HIGH (ref 70–99)
Phosphorus: 3.7 mg/dL (ref 2.5–4.6)
Potassium: 4.3 mmol/L (ref 3.5–5.1)
Sodium: 137 mmol/L (ref 135–145)

## 2022-01-26 LAB — GLUCOSE, CAPILLARY
Glucose-Capillary: 207 mg/dL — ABNORMAL HIGH (ref 70–99)
Glucose-Capillary: 123 mg/dL — ABNORMAL HIGH (ref 70–99)

## 2022-01-26 MED ORDER — TECHNETIUM TC 99M PYROPHOSPHATE
20.7000 | Freq: Once | INTRAVENOUS | Status: AC | PRN
  Administered 2022-01-26: 20.7 via INTRAVENOUS

## 2022-01-26 MED ORDER — FUROSEMIDE 80 MG PO TABS: 80.0000 mg | ORAL_TABLET | Freq: Every day | ORAL | 0 refills | Status: DC

## 2022-01-26 NOTE — Discharge Summary (Signed)
Physician Discharge Summary  Todd Mccoy HYI:[id number] DOB: 04/20/1971 DOA: 01/23/2022  PCP: [name], MD  Admit date: 01/23/2022 Discharge date: 01/26/2022  Admitted From: Home Disposition: Home  Recommendations for Outpatient Follow-up:  Follow up with PCP in 1-2 weeks Please obtain BMP/CBC in one week Cardiology will schedule follow-up Nephrology will schedule follow-up and repeat blood testing.  Home Health: N/A Equipment/Devices: Already present  Discharge Condition: Fair CODE STATUS: Full code Diet recommendation: Low-salt and low-carb diet  Discharge summary: 51 year old with history of hypertension, hyperlipidemia, chronic combined heart failure with known ejection fraction of 40% and grade 3 diastolic dysfunction, type 2 diabetes on insulin, CKD stage IV presented with left-sided chest pain, gradually worsening shortness of breath that he attributed to missing dose of Lyrica.  In the emergency room, afebrile.  Tachycardic and tachypneic.  Blood pressure 204/134 and on room air.  Creatinine 4.1, BNP 2000.  Troponin is 250.  Chest x-ray with cardiomegaly with pulmonary vascular congestion.  Patient was given aspirin, heparin infusion, dose of Lasix and admitted to the hospital.  Seen by cardiology. Significant event: 7/24 morning patient complained of sudden loss of vision, however on further questioning he is having vision loss for more than 3 months and was seen by ophthalmology as outpatient. Remained  in the hospital with abnormal kidney functions, however not currently requiring any hemodialysis.  Chest pain: Atypical chest pain.  Serial troponins EKG nonischemic.  ACS ruled out.  Already on aspirin and statin.  Acute on chronic combined heart failure: Currently off medications at home and recently resumed.  Has a significant renal compromise. Euvolemic today.  Nephrology recommended to continue Coreg, Imdur, Lasix 80 mg daily.  Low-salt diet and low fluid  intake.  Cardiology scheduling follow-up.   Acute kidney injury superimposed on chronic kidney disease stage IV: Previously reported baseline creatinine about 2.4-2.7.  This is likely in the setting of congestive heart failure exacerbation. Creatinine 4.1-4.2-4.3. urine output is adequate.  Seen by nephrology.  Recommended ongoing conservative management with close outpatient follow-up.   Essential hypertension: Accelerated blood pressure on presentation.  Currently improved with amlodipine, Coreg and Imdur.  Discharged on medications.   Hypomagnesemia: Replaced.  Type 2 diabetes with hyperglycemia: Stable on home dose of insulin.  Left BKA, right second toe amputation: Stable.  Smoker: Counseled to quit.  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  This is mostly chronic.  Had episode of transient worsening of vision on 7/24 morning corresponding with low blood pressures.  Patient stated that now his vision is back to his usual self.  He does have ophthalmology follow-up and was previously treated for retinal detachment.  Medically stable.  Multiple chronic medical issues but stable today.  Discharging with close outpatient follow-up.  Discharge Diagnoses:  Principal Problem:   Acute on chronic systolic CHF (congestive heart failure) (HCC) Active Problems:   Chest pain   Elevated troponin   Acute kidney injury superimposed on chronic kidney disease ([place])   Hypertensive emergency   Prolonged QT interval   Diabetes mellitus with hyperglycemia (HCC)   Hyponatremia   Anemia due to chronic kidney disease   Tobacco abuse   S/P BKA (below knee amputation) unilateral, left [facility])   Renal insufficiency    Discharge Instructions  Discharge Instructions     Diet - low sodium heart healthy   Complete by: As directed    Diet Carb Modified   Complete  by: As directed    Increase activity slowly   Complete by: As directed        Allergies as of 01/26/2022   No Known Allergies      Medication List     STOP taking these medications    acetaminophen 325 MG tablet Commonly known as: TYLENOL   metolazone 5 MG tablet Commonly known as: ZAROXOLYN       TAKE these medications    acetaminophen-codeine 300-30 MG tablet Commonly known as: TYLENOL #3 Take 1 tablet by mouth every 12 (twelve) hours as needed for moderate pain.   amLODipine 10 MG tablet Commonly known as: NORVASC Take 1 tablet (10 mg total) by mouth daily.   atorvastatin 20 MG tablet Commonly known as: LIPITOR Take 1 tablet (20 mg total) by mouth daily.   carvedilol 25 MG tablet Commonly known as: COREG Take 1 tablet (25 mg total) by mouth 2 (two) times daily.   CLEAR EYES OP Place 1 drop into both eyes as needed (irritation).   FeroSul 325 (65 FE) MG tablet Generic drug: ferrous sulfate Take 1 tablet (325 mg total) by mouth 3 (three) times daily with meals. What changed: when to take this   furosemide 80 MG tablet Commonly known as: LASIX Take 1 tablet (80 mg total) by mouth daily.   insulin glargine 100 UNIT/ML Solostar Pen Commonly known as: LANTUS Inject 10 Units into the skin at bedtime.   isosorbide mononitrate 60 MG 24 hr tablet Commonly known as: IMDUR Take 1 tablet (60 mg total) by mouth daily.   polyethylene glycol powder 17 GM/SCOOP powder Commonly known as: GLYCOLAX/MIRALAX Take 17 g by mouth 2 (two) times daily as needed. What changed: reasons to take this   pregabalin 50 MG capsule Commonly known as: LYRICA Take 50 mg by mouth 2 (two) times daily.        No Known Allergies  Consultations: Cardiology Nephrology Ophthalmology, called.   Procedures/Studies: CT HEAD CODE STROKE WO CONTRAST  Result Date: 01/25/2022 CLINICAL DATA:  Code stroke.  Loss of vision EXAM: CT HEAD WITHOUT CONTRAST TECHNIQUE: Contiguous axial images were obtained from the base of the skull through the vertex without  intravenous contrast. RADIATION DOSE REDUCTION: This exam was performed according to the departmental dose-optimization program which includes automated exposure control, adjustment of the mA and/or kV according to patient size and/or use of iterative reconstruction technique. COMPARISON:  None Available. FINDINGS: Brain: No evidence of acute infarction, hemorrhage, hydrocephalus, extra-axial collection or mass lesion/mass effect. 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 Vascular: No hyperdense vessel or unexpected calcification. Skull: Normal. Negative for fracture or focal lesion. Sinuses/Orbits: Oil tamponade in the right globe. High-density subretinal appearance in the posterior left lobe. Other: These results were called by telephone at the time of interpretation on 01/25/2022 at 5:08 am to provider [facility] , who verbally acknowledged these results. ASPECTS [facility] Stroke Program Early CT Score) Not scored with this history IMPRESSION: 1. High-density sub retinal effusion on the left. 2. Oil tamponade in the right globe. 3. No acute intracranial finding. Electronically Signed   By: Jorje Guild M.D.   On: 01/25/2022 05:09   ECHOCARDIOGRAM COMPLETE  Result Date: 01/23/2022    ECHOCARDIOGRAM REPORT   Patient Name:   HALEY ROZA Date of Exam: 01/23/2022 Medical Rec #:  [id number]           Height:       70.0 in Accession #:    [id number]  Weight:       190.0 lb Date of Birth:  1971/04/25           BSA:          2.042 m Patient Age:    51 years            BP:           152/101 mmHg Patient Gender: M                   HR:           67 bpm. Exam Location:  Inpatient Procedure: 2D Echo, 3D Echo, Cardiac Doppler, Color Doppler and Strain Analysis Indications:    CHF  History:        Patient has prior history of Echocardiogram examinations, most                 recent 02/27/2021. Signs/Symptoms:Edema; Risk                 Factors:Hypertension. ESRD.  Sonographer:    Merrie Roof  RDCS Referring Phys: Norval Morton  Sonographer Comments: Global longitudinal strain was attempted. IMPRESSIONS  1. Left ventricular ejection fraction by 3D volume is 38 %. The left ventricle has moderate to severely decreased function. The left ventricle demonstrates global hypokinesis. There is severe left ventricular hypertrophy. Left ventricular diastolic parameters are consistent with Grade III diastolic dysfunction (restrictive). The average left ventricular global longitudinal strain is -8.8 %. The global longitudinal strain is abnormal. Strain pattern consistent with apical sparing, consider an evaluation for cardiac amyloidosis if clinically relevant.  2. Right ventricular systolic function is moderately reduced. The right ventricular size is mildly enlarged. Moderately increased right ventricular wall thickness. There is normal pulmonary artery systolic pressure. The estimated right ventricular systolic pressure is 62.9 mmHg.  3. Left atrial size was severely dilated.  4. A small pericardial effusion is present. There is no evidence of cardiac tamponade.  5. The mitral valve is grossly normal. Mild mitral valve regurgitation. No evidence of mitral stenosis.  6. The aortic valve is tricuspid. There is mild thickening of the aortic valve. Aortic valve regurgitation is not visualized. No aortic stenosis is present.  7. The inferior vena cava is normal in size with greater than 50% respiratory variability, suggesting right atrial pressure of 3 mmHg. FINDINGS  Left Ventricle: Left ventricular ejection fraction by 3D volume is 38 %. The left ventricle has moderate to severely decreased function. The left ventricle demonstrates global hypokinesis. The average left ventricular global longitudinal strain is -8.8 %. The global longitudinal strain is abnormal. The left ventricular internal cavity size was normal in size. There is severe left ventricular hypertrophy. Left ventricular diastolic parameters are  consistent with Grade III diastolic dysfunction (restrictive). Right Ventricle: The right ventricular size is mildly enlarged. Moderately increased right ventricular wall thickness. Right ventricular systolic function is moderately reduced. There is normal pulmonary artery systolic pressure. The tricuspid regurgitant velocity is 2.13 m/s, and with an assumed right atrial pressure of 3 mmHg, the estimated right ventricular systolic pressure is 52.8 mmHg. Left Atrium: Left atrial size was severely dilated. Right Atrium: Right atrial size was normal in size. Pericardium: A small pericardial effusion is present. There is no evidence of cardiac tamponade. Mitral Valve: The mitral valve is grossly normal. There is mild thickening of the mitral valve leaflet(s). Mild mitral valve regurgitation. No evidence of mitral valve stenosis. Tricuspid Valve: The tricuspid valve is normal in structure. Tricuspid  valve regurgitation is mild . No evidence of tricuspid stenosis. Aortic Valve: The aortic valve is tricuspid. There is mild thickening of the aortic valve. Aortic valve regurgitation is not visualized. No aortic stenosis is present. Pulmonic Valve: The pulmonic valve was normal in structure. Pulmonic valve regurgitation is mild. No evidence of pulmonic stenosis. Aorta: The aortic root is normal in size and structure. Venous: The inferior vena cava is normal in size with greater than 50% respiratory variability, suggesting right atrial pressure of 3 mmHg. IAS/Shunts: No atrial level shunt detected by color flow Doppler.  LEFT VENTRICLE PLAX 2D LVIDd:         4.10 cm         Diastology LVIDs:         3.40 cm         LV e' medial:    4.35 cm/s LV PW:         1.60 cm         LV E/e' medial:  20.0 LV IVS:        2.00 cm         LV e' lateral:   4.90 cm/s LVOT diam:     2.20 cm         LV E/e' lateral: 17.8 LVOT Area:     3.80 cm                                2D                                Longitudinal                                 Strain                                2D Strain GLS  -8.8 %                                Avg:                                 3D Volume EF                                LV 3D EF:    Left                                             ventricul                                             ar  ejection                                             fraction                                             by 3D                                             volume is                                             38 %.                                 3D Volume EF:                                3D EF:        38 %                                LV EDV:       171 ml                                LV ESV:       106 ml                                LV SV:        65 ml RIGHT VENTRICLE            IVC RV Basal diam:  4.50 cm    IVC diam: 2.10 cm RV Mid diam:    3.80 cm RV S prime:     6.42 cm/s TAPSE (M-mode): 0.7 cm LEFT ATRIUM              Index        RIGHT ATRIUM           Index LA diam:        5.30 cm  2.60 cm/m   RA Area:     19.50 cm LA Vol (A2C):   130.0 ml 63.65 ml/m  RA Volume:   61.10 ml  29.92 ml/m LA Vol (A4C):   91.0 ml  44.56 ml/m LA Biplane Vol: 112.0 ml 54.84 ml/m   AORTA Ao Root diam: 3.50 cm Ao Asc diam:  3.00 cm MITRAL VALVE               TRICUSPID VALVE MV Area (PHT): 4.80 cm    TR Peak grad:   18.1 mmHg MV Decel Time: 158 msec    TR Vmax:        213.00 cm/s MV E velocity: 87.20 cm/s  MV A velocity: 44.20 cm/s  SHUNTS MV E/A ratio:  1.97        Systemic Diam: 2.20 cm Cherlynn Kaiser MD Electronically signed by Cherlynn Kaiser MD Signature Date/Time: 01/23/2022/5:30:29 PM    Final    DG Chest 2 View  Result Date: 01/23/2022 CLINICAL DATA:  Chest pain, shortness of breath. EXAM: CHEST - 2 VIEW COMPARISON:  02/27/2021. FINDINGS: Heart is enlarged and the mediastinal contour is within normal limits. The pulmonary vasculature is distended. Mild perihilar airspace  disease is noted on the right. There is mild atelectasis on the left. There is a moderate pleural effusion on the right. No pneumothorax bilaterally. No acute osseous abnormality. IMPRESSION: 1. Cardiomegaly with pulmonary vascular congestion. 2. Mild perihilar airspace disease on the right, possible atelectasis, edema, or infiltrate. 3. Moderate right pleural effusion. Electronically Signed   By: Brett Fairy M.D.   On: 01/23/2022 03:40   (Echo, Carotid, EGD, Colonoscopy, ERCP)    Subjective: Patient seen and examined.  Very upset about not going home in the morning rounds.  Patient tells me that his eyes are how they have been since long time.  Insists on going home. I called and discussed case with nephrology and cardiology service.    Discharge Exam: Vitals:   01/25/22 2036 01/26/22 0421  BP: (!) 138/91 (!) 144/96  Pulse: 76 81  Resp: 15 13  Temp: 98.4 F (36.9 C) 97.6 F (36.4 C)  SpO2: 97%    Vitals:   01/25/22 1217 01/25/22 1655 01/25/22 2036 01/26/22 0421  BP: (!) 146/95 (!) 154/101 (!) 138/91 (!) 144/96  Pulse: 79 79 76 81  Resp: 11 11 15 13   Temp: 98.1 F (36.7 C) (!) 97.4 F (36.3 C) 98.4 F (36.9 C) 97.6 F (36.4 C)  TempSrc: Oral Oral Oral Oral  SpO2:   97%   Weight:    95.8 kg  Height:        General exam: Appears calm and comfortable  Anxious.  On room air. Blind in the left eye. Waving of fingers present on right eye. Respiratory system: Mostly bilateral clear.  Fine crackles at bases. Cardiovascular system: S1 & S2 heard, RRR.  Trace bilateral pedal edema. Gastrointestinal system: Abdomen is nondistended, soft and nontender. No organomegaly or masses felt. Normal bowel sounds heard. Central nervous system: Alert and oriented. No focal neurological deficits. Extremities:  Left BKA stump clean and dry.  Right second toe amputation clean and dry.    The results of significant diagnostics from this hospitalization (including imaging, microbiology,  ancillary and laboratory) are listed below for reference.     Microbiology: Recent Results (from the past 240 hour(s))  MRSA Next Gen by PCR, Nasal     Status: None   Collection Time: 01/25/22  2:19 PM   Specimen: Nasal Mucosa; Nasal Swab  Result Value Ref Range Status   MRSA by PCR Next Gen NOT DETECTED NOT DETECTED Final    Comment: (NOTE) The GeneXpert MRSA Assay (FDA approved for NASAL specimens only), is one component of a comprehensive MRSA colonization surveillance program. It is not intended to diagnose MRSA infection nor to guide or monitor treatment for MRSA infections. Test performance is not FDA approved in patients less than 27 years old. Performed at [facility], [place] [address], [place], [place] [address]      Labs: BNP (last 3 results) Recent Labs    02/17/21 0600 02/27/21 0622 01/23/22 0309  BNP >4,500.0* >4,500.0* 2,005.4*  Basic Metabolic Panel: Recent Labs  Lab 01/23/22 0309 01/24/22 0141 01/25/22 0433 01/26/22 0209  NA 134* 140 137 137  K 4.2 4.1 4.1 4.3  CL 105 109 106 106  CO2 23 24 22 24   GLUCOSE 151* 128* 119* 163*  BUN 38* 39* 42* 44*  CREATININE 4.10* 4.20* 4.30* 4.48*  CALCIUM 7.5* 7.3* 7.5* 7.5*  MG  --  1.3* 1.6*  --   PHOS  --  3.7 3.7 3.7   Liver Function Tests: Recent Labs  Lab 01/24/22 0141 01/25/22 0433 01/26/22 0209  ALBUMIN 1.7* 1.7* 1.8*   No results for input(s): "LIPASE", "AMYLASE" in the last 168 hours. No results for input(s): "AMMONIA" in the last 168 hours. CBC: Recent Labs  Lab 01/23/22 0309 01/24/22 0141 01/25/22 0433 01/26/22 0209  WBC 5.2 5.3 4.2 5.3  NEUTROABS 3.7  --   --   --   HGB 12.2* 9.8* 9.4* 9.4*  HCT 40.4 31.8* 30.7* 29.2*  MCV 90.6 87.6 89.2 86.1  PLT 166 157 144* 146*   Cardiac Enzymes: No results for input(s): "CKTOTAL", "CKMB", "CKMBINDEX", "TROPONINI" in the last 168 hours. BNP: Invalid input(s): "POCBNP" CBG: Recent Labs  Lab 01/25/22 1220 01/25/22 1658 01/25/22 2134  01/26/22 0751 01/26/22 1149  GLUCAP 118* 118* 189* 207* 123*   D-Dimer No results for input(s): "DDIMER" in the last 72 hours. Hgb A1c No results for input(s): "HGBA1C" in the last 72 hours. Lipid Profile No results for input(s): "CHOL", "HDL", "LDLCALC", "TRIG", "CHOLHDL", "LDLDIRECT" in the last 72 hours. Thyroid function studies No results for input(s): "TSH", "T4TOTAL", "T3FREE", "THYROIDAB" in the last 72 hours.  Invalid input(s): "FREET3" Anemia work up No results for input(s): "VITAMINB12", "FOLATE", "FERRITIN", "TIBC", "IRON", "RETICCTPCT" in the last 72 hours. Urinalysis    Component Value Date/Time   COLORURINE AMBER (A) 02/27/2021 0813   APPEARANCEUR HAZY (A) 02/27/2021 0813   LABSPEC 1.023 02/27/2021 0813   PHURINE 5.0 02/27/2021 0813   GLUCOSEU 50 (A) 02/27/2021 0813   HGBUR MODERATE (A) 02/27/2021 0813   BILIRUBINUR NEGATIVE 02/27/2021 0813   KETONESUR NEGATIVE 02/27/2021 0813   PROTEINUR >=300 (A) 02/27/2021 0813   UROBILINOGEN 1.0 09/06/2014 2307   NITRITE NEGATIVE 02/27/2021 0813   LEUKOCYTESUR NEGATIVE 02/27/2021 0813   Sepsis Labs Recent Labs  Lab 01/23/22 0309 01/24/22 0141 01/25/22 0433 01/26/22 0209  WBC 5.2 5.3 4.2 5.3   Microbiology Recent Results (from the past 240 hour(s))  MRSA Next Gen by PCR, Nasal     Status: None   Collection Time: 01/25/22  2:19 PM   Specimen: Nasal Mucosa; Nasal Swab  Result Value Ref Range Status   MRSA by PCR Next Gen NOT DETECTED NOT DETECTED Final    Comment: (NOTE) The GeneXpert MRSA Assay (FDA approved for NASAL specimens only), is one component of a comprehensive MRSA colonization surveillance program. It is not intended to diagnose MRSA infection nor to guide or monitor treatment for MRSA infections. Test performance is not FDA approved in patients less than 30 years old. Performed at [facility], [place] [address] [place], [place] [address]      Time coordinating discharge: 35  minutes  SIGNED:   Barb Merino, MD  [facility] 01/26/2022, 4:28 PM

## 2022-01-26 NOTE — Progress Notes (Signed)
Subjective:  BP seems fine-  1100 of UOP -  BUN and crt about the same - will not admit to being uremic-  vision changes are better Objective Vital signs in last 24 hours: Vitals:   01/25/22 1217 01/25/22 1655 01/25/22 2036 01/26/22 0421  BP: (!) 146/95 (!) 154/101 (!) 138/91 (!) 144/96  Pulse: 79 79 76 81  Resp: 11 11 15 13   Temp: 98.1 F (36.7 C) (!) 97.4 F (36.3 C) 98.4 F (36.9 C) 97.6 F (36.4 C)  TempSrc: Oral Oral Oral Oral  SpO2:   97%   Weight:    95.8 kg  Height:       Weight change: 1.7 kg  Intake/Output Summary (Last 24 hours) at 01/26/2022 1039 Last data filed at 01/26/2022 0424 Gross per 24 hour  Intake 632.58 ml  Output 1100 ml  Net -467.42 ml   Assessment/Plan: 51 year old BM with multiple medical problems including CKD with proteinuria - admitted with hypertensive urgency-  slight worsening of renal function  1.Renal- has longstanding progressive CKD dating back to 2021.  I suspect due to DM and HTN not well controlled.  He has significant proteinuria which puts him at risk for progression.  He now presents with CP and hypertensive urgency-  fortunately his GFR is not that different from baseline-  there are no indications for dialysis -  would suspect some slight change in the setting of hypertensive urgency with relatively rapid correction-  would aim for BP right where it is now-  140's-160's and hopefully it will settle out.  Pt does not seem to want to stay for some reason-  is thinking about signing out AMA.  Has hypoalbuminemia  and much proteinuria but not able to initiate anything like ACE/ARB or SGLT2 at this time-  possibly as OP if function stabilizes  2. Hypertension/volume  - BP much better now than on admit-  I think the relatively rapid BP lowering led to his vision changes maybe-  he now says is better-  continue home norvasc and coreg.  Would resume lasix at discharge 80 per day  3.  Anemia  - not a major issue  4. Dispo-   I will make sure he has OP  follow up with [place]: Basic Metabolic Panel: Recent Labs  Lab 01/24/22 0141 01/25/22 0433 01/26/22 0209  NA 140 137 137  K 4.1 4.1 4.3  CL 109 106 106  CO2 24 22 24   GLUCOSE 128* 119* 163*  BUN 39* 42* 44*  CREATININE 4.20* 4.30* 4.48*  CALCIUM 7.3* 7.5* 7.5*  PHOS 3.7 3.7 3.7   Liver Function Tests: Recent Labs  Lab 01/24/22 0141 01/25/22 0433 01/26/22 0209  ALBUMIN 1.7* 1.7* 1.8*   No results for input(s): "LIPASE", "AMYLASE" in the last 168 hours. No results for input(s): "AMMONIA" in the last 168 hours. CBC: Recent Labs  Lab 01/23/22 0309 01/24/22 0141 01/25/22 0433 01/26/22 0209  WBC 5.2 5.3 4.2 5.3  NEUTROABS 3.7  --   --   --   HGB 12.2* 9.8* 9.4* 9.4*  HCT 40.4 31.8* 30.7* 29.2*  MCV 90.6 87.6 89.2 86.1  PLT 166 157 144* 146*   Cardiac Enzymes: No results for input(s): "CKTOTAL", "CKMB", "CKMBINDEX", "TROPONINI" in the last 168 hours. CBG: Recent Labs  Lab 01/25/22 0759 01/25/22 1220 01/25/22 1658 01/25/22 2134 01/26/22 0751  GLUCAP 102* 118* 118* 189* 207*    Iron  Studies: No results for input(s): "IRON", "TIBC", "TRANSFERRIN", "FERRITIN" in the last 72 hours. Studies/Results: CT HEAD CODE STROKE WO CONTRAST  Result Date: 01/25/2022 CLINICAL DATA:  Code stroke.  Loss of vision EXAM: CT HEAD WITHOUT CONTRAST TECHNIQUE: Contiguous axial images were obtained from the base of the skull through the vertex without intravenous contrast. RADIATION DOSE REDUCTION: This exam was performed according to the departmental dose-optimization program which includes automated exposure control, adjustment of the mA and/or kV according to patient size and/or use of iterative reconstruction technique. COMPARISON:  None Available. FINDINGS: Brain: No evidence of acute infarction, hemorrhage, hydrocephalus, extra-axial collection or mass lesion/mass effect. 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 Vascular: No hyperdense  vessel or unexpected calcification. Skull: Normal. Negative for fracture or focal lesion. Sinuses/Orbits: Oil tamponade in the right globe. High-density subretinal appearance in the posterior left lobe. Other: These results were called by telephone at the time of interpretation on 01/25/2022 at 5:08 am to provider [facility] , who verbally acknowledged these results. ASPECTS [facility] Stroke Program Early CT Score) Not scored with this history IMPRESSION: 1. High-density sub retinal effusion on the left. 2. Oil tamponade in the right globe. 3. No acute intracranial finding. Electronically Signed   By: Jorje Guild M.D.   On: 01/25/2022 05:09   Medications: Infusions:   Scheduled Medications:  amLODipine  10 mg Oral Daily   atorvastatin  20 mg Oral Daily   carvedilol  25 mg Oral BID   ferrous sulfate  325 mg Oral Q breakfast   insulin aspart  0-6 Units Subcutaneous TID WC   insulin glargine-yfgn  5 Units Subcutaneous QHS   isosorbide mononitrate  60 mg Oral Daily   pregabalin  50 mg Oral BID   sodium chloride flush  3 mL Intravenous Q12H    have reviewed scheduled and prn medications.  Physical Exam: General:  NAD Heart: RRR Lungs: mostly clear Abdomen: soft, non tender Extremities: no edema    01/26/2022,10:39 AM  LOS: 3 days

## 2022-01-26 NOTE — Plan of Care (Signed)

## 2022-01-26 NOTE — Progress Notes (Addendum)
Progress Note  Patient Name: Todd Mccoy Date of Encounter: 01/26/2022  [facility] Cardiologist: Fransico Him, MD   Subjective   Headaches and visual changes have resolved.  Nephrology concerned that visual changes could have been due to acutely lower blood pressure.  Denies any chest pain or shortness of breath.  He is n.p.o. for PYP scan today Inpatient Medications    Scheduled Meds:  amLODipine  10 mg Oral Daily   atorvastatin  20 mg Oral Daily   carvedilol  25 mg Oral BID   ferrous sulfate  325 mg Oral Q breakfast   insulin aspart  0-6 Units Subcutaneous TID WC   insulin glargine-yfgn  5 Units Subcutaneous QHS   isosorbide mononitrate  60 mg Oral Daily   pregabalin  50 mg Oral BID   sodium chloride flush  3 mL Intravenous Q12H   Continuous Infusions:  PRN Meds: acetaminophen **OR** acetaminophen, acetaminophen-codeine, albuterol, polyethylene glycol   Vital Signs    Vitals:   01/25/22 1217 01/25/22 1655 01/25/22 2036 01/26/22 0421  BP: (!) 146/95 (!) 154/101 (!) 138/91 (!) 144/96  Pulse: 79 79 76 81  Resp: 11 11 15 13   Temp: 98.1 F (36.7 C) (!) 97.4 F (36.3 C) 98.4 F (36.9 C) 97.6 F (36.4 C)  TempSrc: Oral Oral Oral Oral  SpO2:   97%   Weight:    95.8 kg  Height:        Intake/Output Summary (Last 24 hours) at 01/26/2022 1015 Last data filed at 01/26/2022 0424 Gross per 24 hour  Intake 632.58 ml  Output 1100 ml  Net -467.42 ml       01/26/2022    4:21 AM 01/25/2022    6:00 AM 01/24/2022    4:59 AM  Last 3 Weights  Weight (lbs) 211 lb 3.2 oz 207 lb 7.3 oz 208 lb 15.9 oz  Weight (kg) 95.8 kg 94.1 kg 94.8 kg      Telemetry    Normal sinus rhythm- Personally Reviewed  ECG    No new EKG to review- Personally Reviewed  Physical Exam   GEN: Well nourished, well developed in no acute distress HEENT: Normal NECK: No JVD; No carotid bruits LYMPHATICS: No lymphadenopathy CARDIAC:RRR, no murmurs, rubs, gallops RESPIRATORY:  Clear to  auscultation without rales, wheezing or rhonchi  ABDOMEN: Soft, non-tender, non-distended MUSCULOSKELETAL:  No edema; No deformity  SKIN: Warm and dry NEUROLOGIC:  Alert and oriented x 3 PSYCHIATRIC:  Normal affect   High Sensitivity Troponin:   Recent Labs  Lab 01/23/22 0309 01/23/22 0622  TROPONINIHS 250* 252*      Chemistry Recent Labs  Lab 01/24/22 0141 01/25/22 0433 01/26/22 0209  NA 140 137 137  K 4.1 4.1 4.3  CL 109 106 106  CO2 24 22 24   GLUCOSE 128* 119* 163*  BUN 39* 42* 44*  CREATININE 4.20* 4.30* 4.48*  CALCIUM 7.3* 7.5* 7.5*  MG 1.3* 1.6*  --   ALBUMIN 1.7* 1.7* 1.8*  GFRNONAA 16* 16* 15*  ANIONGAP 7 9 7      Lipids No results for input(s): "CHOL", "TRIG", "HDL", "LABVLDL", "LDLCALC", "CHOLHDL" in the last 168 hours.  Hematology Recent Labs  Lab 01/24/22 0141 01/25/22 0433 01/26/22 0209  WBC 5.3 4.2 5.3  RBC 3.63* 3.44* 3.39*  HGB 9.8* 9.4* 9.4*  HCT 31.8* 30.7* 29.2*  MCV 87.6 89.2 86.1  MCH 27.0 27.3 27.7  MCHC 30.8 30.6 32.2  RDW 15.2 15.3 15.3  PLT 157 144* 146*  Thyroid No results for input(s): "TSH", "FREET4" in the last 168 hours.  BNP Recent Labs  Lab 01/23/22 0309  BNP 2,005.4*     DDimer No results for input(s): "DDIMER" in the last 168 hours.   Radiology    CT HEAD CODE STROKE WO CONTRAST  Result Date: 01/25/2022 CLINICAL DATA:  Code stroke.  Loss of vision EXAM: CT HEAD WITHOUT CONTRAST TECHNIQUE: Contiguous axial images were obtained from the base of the skull through the vertex without intravenous contrast. RADIATION DOSE REDUCTION: This exam was performed according to the departmental dose-optimization program which includes automated exposure control, adjustment of the mA and/or kV according to patient size and/or use of iterative reconstruction technique. COMPARISON:  None Available. FINDINGS: Brain: No evidence of acute infarction, hemorrhage, hydrocephalus, extra-axial collection or mass lesion/mass effect. Small  remote anterior left frontal cortex infarct. Vascular: No hyperdense vessel or unexpected calcification. Skull: Normal. Negative for fracture or focal lesion. Sinuses/Orbits: Oil tamponade in the right globe. High-density subretinal appearance in the posterior left lobe. Other: These results were called by telephone at the time of interpretation on 01/25/2022 at 5:08 am to provider [facility] , who verbally acknowledged these results. ASPECTS [facility] Stroke Program Early CT Score) Not scored with this history IMPRESSION: 1. High-density sub retinal effusion on the left. 2. Oil tamponade in the right globe. 3. No acute intracranial finding. Electronically Signed   By: Jorje Guild M.D.   On: 01/25/2022 05:09    Cardiac Studies   Echocardiogram 01/23/22  1. Left ventricular ejection fraction by 3D volume is 38 %. The left  ventricle has moderate to severely decreased function. The left ventricle  demonstrates global hypokinesis. There is severe left ventricular  hypertrophy. Left ventricular diastolic  parameters are consistent with Grade III diastolic dysfunction  (restrictive). The average left ventricular global longitudinal strain is  -8.8 %. The global longitudinal strain is abnormal. Strain pattern  consistent with apical sparing, consider an  evaluation for cardiac amyloidosis if clinically relevant.   2. Right ventricular systolic function is moderately reduced. The right  ventricular size is mildly enlarged. Moderately increased right  ventricular wall thickness. There is normal pulmonary artery systolic  pressure. The estimated right ventricular  systolic pressure is 64.4 mmHg.   3. Left atrial size was severely dilated.   4. A small pericardial effusion is present. There is no evidence of  cardiac tamponade.   5. The mitral valve is grossly normal. Mild mitral valve regurgitation.  No evidence of mitral stenosis.   6. The aortic valve is tricuspid. There is mild thickening  of the aortic  valve. Aortic valve regurgitation is not visualized. No aortic stenosis is  present.   7. The inferior vena cava is normal in size with greater than 50%  respiratory variability, suggesting right atrial pressure of 3 mmHg.   Patient Profile     51 y.o. 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 - hsTn 250>>252-- remained flat despite continuous chest pain. This is not consistent with ACS patten  - Currently chest pain free  -  Patient had a normal nuclear stress test in 04/2021  - Suspect trop elevation is secondary to volume overload in the setting of AKI (creatinine 4 on admission) >> no further ischemic work-up at this time  Acute on chronic combined systolic and diastolic heart failure  - Echocardiogram this admission with EF 38%, global hypokinesis, grade III diastolic dysfunctino, moderately reduced RV systolic function  - CXR on admission showed moderate right pleural effusion, cardiomegaly with pulmonary vascular congestion, mild perihilar airspace disease on the right  - Diuretics held due to AKI on CKD - Creatinine continues to uptrend despite holding diuretics (4.10 on 7/22. Up to 4.48 today) - Patient does not appear volume overloaded on exam. Denies sob, chest pressure, orthopnea, ankle edema. - [facility]-->> appreciate recs from nephrology.  Plan for no further diuretics at this time and nephrology will determine when and what dose they will be reinstituted at  - Continue carvedilol 25 mg BID and  Imdur 60 mg daily. - Would benefit from addition of hydralazine at some point when okay by nephrology>> but for now they do not want his blood pressure to drop any further - GDMT limited by renal function >> no ACEI/ARB/ARNi/MRA -2D echo with reduced LV strain but sparing of the apex and in the setting of moderate to severe LVH and renal failure need to consider cardiac amyloid.  He will be getting a PYP scan today.  SPEP and UPEP are pending we will follow-up on this outpatient  AKI on CKD  - Baseline creatinine was 2.5 in 05/2021, increased to 3.82 on 01/09/22 and now at 4.48. - Avoid nephrotoxic medications -Nephrology following  HTN emergency  - BP still up at times especially diastolic but nephrology wants to keep blood pressure about where it is now  - continue amlodipine 10 mg daily, carvedilol 25 mg BID, imdur 60 mg daily   HLD  - Continue statin   Otherwise per primary  - Type 2 DM  - History of left BKA and right 2nd toe amputation  - Possible hematuria   From cardiac standpoint he is okay for discharge home after he gets his PYP scan done  [facility] will sign off.   Medication Recommendations: Amlodipine 10 mg daily, carvedilol 25 mg twice daily, atorvastatin 20 mg daily, Imdur 60 mg daily Other recommendations (labs, testing, etc): None Follow up as an outpatient: Dr. Radford Pax or extender in 2 weeks  For questions or updates, please contact [place] Please consult [email or web address] for contact info under        Signed, Fransico Him, MD  01/26/2022, 10:15 AM

## 2022-01-26 NOTE — Progress Notes (Signed)
Order placed for PYP scan per below note. Edmon Crape to schedule scan at [facility] office.

## 2022-01-26 NOTE — Progress Notes (Signed)
Heart Failure Navigator Progress Note  Assessed for Heart & Vascular TOC clinic readiness.  Patient has a hospital follow up scheduled with cardiology, Dr. Radford Pax on 02/09/2022.   Navigator available for reassessment of patient.   Earnestine Leys, [name], Clinical cytogeneticist Only

## 2022-01-26 NOTE — Progress Notes (Signed)
Ordered PYP scan per Dr. Theodosia Blender request. Patient was scheduled to have the scan completed while inpatient, but the IV was infiltrated and the study was not completed. I have sent a message to the office to help arrange.

## 2022-01-27 ENCOUNTER — Ambulatory Visit: Payer: Medicaid Other | Admitting: Physician Assistant

## 2022-01-27 ENCOUNTER — Telehealth: Payer: Self-pay | Admitting: *Deleted

## 2022-01-27 NOTE — Patient Outreach (Signed)
Care Coordination  01/27/2022  Todd Mccoy Oct 18, 1970 [id number]  Transition Care Management Follow-up Telephone Call Date of discharge and from where: 01/26/22, [id number] How have you been since you were released from the hospital? "I'm ok" Any questions or concerns? Yes, patient would like to switch to a pharmcy that provides compliance packaging. Provided patient with [place]  Items Reviewed: Did the pt receive and understand the discharge instructions provided? Yes  Medications obtained and verified? Yes  Other? No  Any new allergies since your discharge? No  Dietary orders reviewed? Yes Do you have support at home? Yes   Home Care and Equipment/Supplies: Were home health services ordered? no If so, what is the name of the agency? N/A  Has the agency set up a time to come to the patient's home? not applicable Were any new equipment or medical supplies ordered?  No What is the name of the medical supply agency? N/A Were you able to get the supplies/equipment? not applicable Do you have any questions related to the use of the equipment or supplies? No  Functional Questionnaire: (I = Independent and D = Dependent) ADLs: I  Bathing/Dressing- I  Meal Prep- D  Eating- I  Maintaining continence- I  Transferring/Ambulation- I  Managing Meds- D  Follow up appointments reviewed:  PCP Hospital f/u appt confirmed? No  Message sent  Specialist Hospital f/u appt confirmed? Yes  Scheduled to see Cardiology on 02/09/22 @ 8am. Are transportation arrangements needed? No  If their condition worsens, is the pt aware to call PCP or go to the Emergency Dept.? Yes Was the patient provided with contact information for the PCP's office or ED? No Was to pt encouraged to call back with questions or concerns? Yes  Lurena Joiner RN, [name] [place]  [employer] manager

## 2022-01-28 LAB — PROTEIN ELECTROPHORESIS, SERUM
A/G Ratio: 0.7 (ref 0.7–1.7)
Albumin ELP: 2.2 g/dL — ABNORMAL LOW (ref 2.9–4.4)
Alpha-1-Globulin: 0.2 g/dL (ref 0.0–0.4)
Alpha-2-Globulin: 0.6 g/dL (ref 0.4–1.0)
Beta Globulin: 0.9 g/dL (ref 0.7–1.3)
Gamma Globulin: 1.7 g/dL (ref 0.4–1.8)
Globulin, Total: 3.3 g/dL (ref 2.2–3.9)
Total Protein ELP: 5.5 g/dL — ABNORMAL LOW (ref 6.0–8.5)

## 2022-02-02 ENCOUNTER — Telehealth (HOSPITAL_COMMUNITY): Payer: Self-pay | Admitting: *Deleted

## 2022-02-02 ENCOUNTER — Other Ambulatory Visit (HOSPITAL_COMMUNITY): Payer: Self-pay

## 2022-02-02 DIAGNOSIS — Z419 Encounter for procedure for purposes other than remedying health state, unspecified: Secondary | ICD-10-CM | POA: Diagnosis not present

## 2022-02-02 NOTE — Telephone Encounter (Signed)
Close encounter 

## 2022-02-03 ENCOUNTER — Ambulatory Visit (HOSPITAL_COMMUNITY): Admit: 2022-02-03 | Payer: Medicaid Other | Attending: Cardiology | Admitting: Cardiology

## 2022-02-04 ENCOUNTER — Other Ambulatory Visit: Payer: Self-pay

## 2022-02-04 ENCOUNTER — Telehealth: Payer: Self-pay

## 2022-02-04 NOTE — Telephone Encounter (Signed)
Call placed to patient regarding hospital follow up appointment with PCP. Message left with call back requested.   He has an appointment with cardiology - 02/09/2022. He also has an appointment with  PCP- Dr Margarita Rana on 02/24/2022. It is not scheduled as a hospital follow up but can be changed as needed.

## 2022-02-04 NOTE — Patient Outreach (Signed)
Care Coordination  02/04/2022  ANURAG SCARFO 1970/08/14 [id number]  Successful outreach with Mr. Busche, however, he is unavailable to talk with [facility] today and request to reschedule this telephone appointment. A new telephone appointment was set for 02/18/22 @ 11:15am. Mr. Jurewicz agreed to date and time.  Lurena Joiner RN, [name] [place]  [employer] manager

## 2022-02-05 ENCOUNTER — Telehealth (HOSPITAL_COMMUNITY): Payer: Self-pay | Admitting: *Deleted

## 2022-02-05 ENCOUNTER — Other Ambulatory Visit (HOSPITAL_COMMUNITY): Payer: Self-pay

## 2022-02-05 NOTE — Telephone Encounter (Signed)
Close encounter 

## 2022-02-08 NOTE — Progress Notes (Unsigned)
Office Visit    Patient Name: Todd Mccoy Date of Encounter: 02/09/2022  PCP:  [name], [place]  Cardiologist:  Fransico Him, MD  Advanced Practice Provider:  No care team member to display Electrophysiologist:  None   Chief Complaint    Todd Mccoy is a 51 y.o. 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  The patient uses a wheelchair and is legally blind.  Most recent LVEF 40% on 02/2021.  He was admitted to the ED 02/27/2021 with anasarca secondary to acute combined systolic/diastolic CHF were treated with IV diuretics and metolazone.  His diuretics were managed by nephrology due to diabetic nephropathy with secondary nephrotic syndrome.  He was seen in follow-up by pharmacy 05/2021 for up titration of Norvasc.  He was also referred for sleep study to rule out OSA.  Most recent GFR on 9/2 was 28.  At the time of his last visit his blood pressure was running about 150/80.  He was tolerating his medications well.  He was taking Imdur 30 mg daily although his PCP increased his dose 2 months prior.  He was prescribed 80 mg of Lasix 3 times daily but was only taking it once a day since he had not noticed any swelling.  He was not taking any potassium supplementation.  He had mild swelling in his legs with addition of amlodipine.  He was on carvedilol 25 mg twice a day.  He was working on cutting his smoking down and keeping an eye on his diet.  At his last appointment his amlodipine was increased to 10 mg daily and he was to continue his hydralazine 100 mg 3 times a day.  A refill was sent on his Imdur 60 mg daily (which was the dose he was supposed to be taking).  Today, he states that he is worried  about his blood pressure.  Usually it has been running 170s to 588F at home systolic.  Sometimes he takes his blood pressure before his medicines and sometimes he takes it after.  He is currently on amlodipine 10 mg daily and carvedilol 25 mg twice daily.  He also takes Imdur 60 mg daily.  He is working on cutting down on his cigarettes and only smokes a few a day at this point.  It seems there has been some confusion with his medications in the past we will set him up with one of our Pharm.Ds.  Today, his blood pressure is 170/98.  On repeat it was 158/80.  I do think that he needs a little bit more medicine to get him better controlled.  He also sees a nephrologist for his kidneys and creatinine is 4.4 which limits our choices for medical therapy.  He was recently in the hospital for fluid overload and was diuresed.  He remains on 80 mg of Lasix daily which seems to be controlling his fluid at this point.  Recommend daily weights.  Reports no shortness of breath nor dyspnea on exertion. Reports no chest pain, pressure, or tightness. No orthopnea, PND. Reports no palpitations.    Past Medical History    Past Medical History:  Diagnosis Date   Anemia    Anemia    Anxiety    Chronic combined systolic (congestive)  and diastolic (congestive) heart failure (HCC)    EF 40-45% with G3DD on echo 2022   CKD (chronic kidney disease), stage IV ([place]) 12/10/2020   Diabetes mellitus with complication ([place])    Diabetic neuropathy ([place])    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[place]) 03/25/2020   pt states retinal eye damage- left worse than the right- recent visited MD    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[place]) 03/25/2020   pt states recent MD visit /left eye worse than right   Dyslipidemia 12/10/2020   Hx of BKA, left ([place])    Hypertension    Morbid obesity ([place])    Osteomyelitis ([place])    Pneumonia    Past Surgical History:  Procedure Laterality Date   ABSCESS DRAINAGE     neck   AMPUTATION Left  11/14/2020   Procedure: LEFT 5TH RAY AMPUTATION;  Surgeon: Newt Minion, MD;  Location: [facility];  Service: Orthopedics;  Laterality: Left;   AMPUTATION Left 12/10/2020   Procedure: LEFT BELOW KNEE AMPUTATION;  Surgeon: Newt Minion, MD;  Location: [facility];  Service: Orthopedics;  Laterality: Left;   AMPUTATION TOE Right 03/25/2020   Procedure: AMPUTATION TOE;  Surgeon: Evelina Bucy, DPM;  Location: [facility];  Service: Podiatry;  Laterality: Right;   INCISION AND DRAINAGE ABSCESS Right 09/07/2014   Procedure: INCISION AND DRAINAGE ABSCESS RIGHT FLANK;  Surgeon: Jackolyn Confer, MD;  Location: [facility];  Service: General;  Laterality: Right;   LEG AMPUTATION BELOW KNEE Left 12/10/2020   SCROTUM EXPLORATION      Allergies  No Known Allergies   EKGs/Labs/Other Studies Reviewed:   The following studies were reviewed today:  Echocardiogram 01/23/22 IMPRESSIONS     1. Left ventricular ejection fraction by 3D volume is 38 %. The left  ventricle has moderate to severely decreased function. The left ventricle  demonstrates global hypokinesis. There is severe left ventricular  hypertrophy. Left ventricular diastolic  parameters are consistent with Grade III diastolic dysfunction  (restrictive). The average left ventricular global longitudinal strain is  -8.8 %. The global longitudinal strain is abnormal. Strain pattern  consistent with apical sparing, consider an  evaluation for cardiac amyloidosis if clinically relevant.   2. Right ventricular systolic function is moderately reduced. The right  ventricular size is mildly enlarged. Moderately increased right  ventricular wall thickness. There is normal pulmonary artery systolic  pressure. The estimated right ventricular  systolic pressure is 67.1 mmHg.   3. Left atrial size was severely dilated.   4. A small pericardial effusion is present. There is no evidence of  cardiac tamponade.   5. The mitral valve is grossly normal. Mild mitral valve  regurgitation.  No evidence of mitral stenosis.   6. The aortic valve is tricuspid. There is mild thickening of the aortic  valve. Aortic valve regurgitation is not visualized. No aortic stenosis is  present.   7. The inferior vena cava is normal in size with greater than 50%  respiratory variability, suggesting right atrial pressure of 3 mmHg.   EKG:  EKG is not ordered today.   Recent Labs: 02/27/2021: TSH 2.455 05/21/2021: ALT 18 01/23/2022: B Natriuretic Peptide 2,005.4 01/25/2022: Magnesium 1.6 01/26/2022: BUN 44; Creatinine, Ser 4.48; Hemoglobin 9.4; Platelets 146; Potassium 4.3; Sodium 137  Recent Lipid Panel    Component Value Date/Time   CHOL 82 (L) 05/21/2021 1057   TRIG 67 05/21/2021 1057   HDL 27 (L) 05/21/2021 1057   CHOLHDL 3.0 05/21/2021 1057  LDLCALC 40 05/21/2021 1057   LDLDIRECT 36 05/21/2021 1057     Home Medications   Current Meds  Medication Sig   acetaminophen-codeine (TYLENOL #3) 300-30 MG tablet Take 1 tablet by mouth every 12 (twelve) hours as needed for moderate pain.   amLODipine (NORVASC) 10 MG tablet Take 1 tablet (10 mg total) by mouth daily.   atorvastatin (LIPITOR) 20 MG tablet Take 1 tablet (20 mg total) by mouth daily.   carvedilol (COREG) 25 MG tablet Take 1 tablet (25 mg total) by mouth 2 (two) times daily.   furosemide (LASIX) 80 MG tablet Take 1 tablet (80 mg total) by mouth daily.   insulin glargine (LANTUS) 100 UNIT/ML Solostar Pen Inject 10 Units into the skin at bedtime.   isosorbide mononitrate (IMDUR) 60 MG 24 hr tablet Take 1 tablet (60 mg total) by mouth daily.   Naphazoline HCl (CLEAR EYES OP) Place 1 drop into both eyes as needed (irritation).   polyethylene glycol powder (GLYCOLAX/MIRALAX) 17 GM/SCOOP powder Take 17 g by mouth 2 (two) times daily as needed. (Patient taking differently: Take 17 g by mouth 2 (two) times daily as needed for mild constipation.)   pregabalin (LYRICA) 50 MG capsule Take 50 mg by mouth 2 (two) times  daily.     Review of Systems      All other systems reviewed and are otherwise negative except as noted above.  Physical Exam    VS:  BP (!) 158/80   Pulse 72   Ht 5\' 10"  (1.778 m)   Wt 190 lb (86.2 kg) Comment: per pt report as unable to stand  SpO2 99%   BMI 27.26 kg/m  , BMI Body mass index is 27.26 kg/m.  Wt Readings from Last 3 Encounters:  02/09/22 190 lb (86.2 kg)  01/26/22 211 lb 3.2 oz (95.8 kg)  01/09/22 190 lb (86.2 kg)     GEN: Well nourished, well developed, in no acute distress. HEENT: normal. Neck: Supple, no JVD, carotid bruits, or masses. Cardiac: RRR, no murmurs, rubs, or gallops. No clubbing, cyanosis, 1-2 pitting lower ext edema.  Thready pulses, some edema in left arm (chronic)  Respiratory:  Respirations regular and unlabored, clear to auscultation bilaterally. GI: Soft, nontender, nondistended. MS: No deformity or atrophy. Skin: Warm and dry, no rash. Neuro:  Strength and sensation are intact. Psych: Normal affect.  Assessment & Plan    CHF/HTN -no dialysis but follows with nephrology, creatinine 4.4 -Only taking Amlodipine 10mg  daily and Coreg 25mg   -Taking Imdur 60 mg daily  -BP remains uncontrolled. Will add hydralazine 25mg  TID -Asked him to please take his BP once a day an hour after morning medications for 2 weeks and send me those values via MyChart or call -Follow-up with PharmD BP check in 2 weeks and encouraged to bring in all medication bottles since there was some confusion in the past -He was taking Pressur-Lo supplements over the counter, encouraged to discontinue because could interfere with BP medications and drop his BP too low.   Tobacco abuse -few cigarettes a day, trying to cut down   HLD -Lipid panel, LDL 36  -Repeat at PCP in a few weeks and please send [place] those labs  4. Lower Ext Edema -Recently in the hospital for fluid overload -Continue Lasix 80 mg daily  -Seems to be back down to his baseline today -CBC and BMP  with primary   HYPERTENSION CONTROL Vitals:   02/09/22 0823 02/09/22 0844  BP: (!) 170/98 Marland Kitchen)  158/80    The patient's blood pressure is elevated above target today.  The following intervention was performed to address the patient's elevated BP:  -          Disposition: Follow up 3 months with Fransico Him, MD or APP.  Signed, Elgie Collard, PA-C 02/09/2022, 8:44 AM [place]

## 2022-02-09 ENCOUNTER — Encounter: Payer: Self-pay | Admitting: Physician Assistant

## 2022-02-09 ENCOUNTER — Ambulatory Visit (INDEPENDENT_AMBULATORY_CARE_PROVIDER_SITE_OTHER): Payer: Medicaid Other | Admitting: Physician Assistant

## 2022-02-09 VITALS — BP 158/80 | HR 72 | Ht 70.0 in | Wt 190.0 lb

## 2022-02-09 DIAGNOSIS — E11628 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 complications: Secondary | ICD-10-CM

## 2022-02-09 DIAGNOSIS — I10 Essential (primary) hypertension: Secondary | ICD-10-CM

## 2022-02-09 DIAGNOSIS — E782 Mixed hyperlipidemia: Secondary | ICD-10-CM | POA: Diagnosis not present

## 2022-02-09 DIAGNOSIS — I5042 Chronic combined systolic (congestive) and diastolic (congestive) heart failure: Secondary | ICD-10-CM

## 2022-02-09 DIAGNOSIS — Z720 Tobacco use: Secondary | ICD-10-CM | POA: Diagnosis not present

## 2022-02-09 DIAGNOSIS — N184 Chronic kidney disease, stage 4 (severe): Secondary | ICD-10-CM | POA: Diagnosis not present

## 2022-02-09 MED ORDER — HYDRALAZINE HCL 25 MG PO TABS: 25.0000 mg | ORAL_TABLET | Freq: Three times a day (TID) | ORAL | 3 refills | Status: DC

## 2022-02-09 NOTE — Patient Instructions (Addendum)
Medication Instructions:  Your physician has recommended you make the following change in your medication:  1.Start hydralazine 25 mg, three times daily  *If you need a refill on your cardiac medications before your next appointment, please call your pharmacy*   Lab Work: Have your primary care provider draw a lipid panel when you see them next month and have them fax [place] the results If you have labs (blood work) drawn today and your tests are completely normal, you will receive your results only by: [place] (if you have MyChart) OR A paper copy in the mail If you have any lab test that is abnormal or we need to change your treatment, we will call you to review the results.   Follow-Up: At [facility], you and your health needs are our priority.  As part of our continuing mission to provide you with exceptional heart care, we have created designated Provider Care Teams.  These Care Teams include your primary Cardiologist (physician) and Advanced Practice Providers (APPs -  Physician Assistants and Nurse Practitioners) who all work together to provide you with the care you need, when you need it.  We recommend signing up for the patient portal called "MyChart".  Sign up information is provided on this After Visit Summary.  MyChart is used to connect with patients for Virtual Visits (Telemedicine).  Patients are able to view lab/test results, encounter notes, upcoming appointments, etc.  Non-urgent messages can be sent to your provider as well.   To learn more about what you can do with MyChart, go to [email or web address].    Your next appointment:   3 month(s)  The format for your next appointment:   In Person  Provider:   Fransico Him, MD  or [name], PA-C     {  Other Instructions 1.Check your blood pressure every morning, one hour after taking your medications over the next two week and keep a log of your readings 2.You have been referred to see the pharmacist here  in our office for your blood pressure. You need to see them in 2 weeks. Please bring your blood pressure log and cuff and all of your medication bottles to this visit.   Important Information About Sugar

## 2022-02-12 ENCOUNTER — Ambulatory Visit (HOSPITAL_COMMUNITY)
Admission: RE | Admit: 2022-02-12 | Discharge: 2022-02-12 | Disposition: A | Discharge status: Home / Self Care | Payer: Medicaid Other | Source: Ambulatory Visit | Attending: Cardiovascular Disease | Admitting: Cardiovascular Disease

## 2022-02-12 DIAGNOSIS — I5041 Acute combined systolic (congestive) and diastolic (congestive) heart failure: Secondary | ICD-10-CM | POA: Insufficient documentation

## 2022-02-12 MED ORDER — TECHNETIUM TC 99M PYROPHOSPHATE
20.9000 | Freq: Once | INTRAVENOUS | Status: AC
  Administered 2022-02-12: 20.9 via INTRAVENOUS

## 2022-02-16 ENCOUNTER — Telehealth: Payer: Self-pay

## 2022-02-16 DIAGNOSIS — I5041 Acute combined systolic (congestive) and diastolic (congestive) heart failure: Secondary | ICD-10-CM

## 2022-02-16 NOTE — Telephone Encounter (Signed)
The patient has been notified of the result and verbalized understanding.  All questions (if any) were answered. Antonieta Iba, RN 02/16/2022 10:09 AM  Referral has been placed.

## 2022-02-16 NOTE — Telephone Encounter (Signed)
-----   Message from [name], MD sent at 02/15/2022 10:49 PM EDT ----- Equivocal study for Amyloid - please get in with AHF service ASAP

## 2022-02-18 ENCOUNTER — Other Ambulatory Visit: Payer: Self-pay | Admitting: *Deleted

## 2022-02-18 NOTE — Patient Instructions (Signed)
Visit Information  [name] was given information about Medicaid Managed Care team care coordination services as a part of their [facility] Medicaid benefit. ABED SCHAR verbally consented to engagement with the [facility] Managed Care team.   If you are experiencing a medical emergency, please call 911 or report to your local emergency department or urgent care.   If you have a non-emergency medical problem during routine business hours, please contact your provider's office and ask to speak with a nurse.   For questions related to your [facility] health plan, please call: [phone] or go here:[email or web address]  If you would like to schedule transportation through your [facility] plan, please call the following number at least 2 days in advance of your appointment: [phone].  You can also use the MTM portal or MTM mobile app to manage your rides. For the portal, please go to [email or web address].  Call the [facility] at [phone], at any time, 24 hours a day, 7 days a week. If you are in danger or need immediate medical attention call 911.  If you would like help to quit smoking, call 1-800-QUIT-NOW (814)205-0316) OR Espaol: 1-855-Djelo-Ya (3-335-456-2563) o para ms informacin haga clic aqu or Text READY to 200-400 to register via text  Mr. Stearns,   Please see education materials related to HF and diabetes provided as print materials.   The patient verbalized understanding of instructions, educational materials, and care plan provided today and agreed to receive a mailed copy of patient instructions, educational materials, and care plan.   Telephone follow up appointment with Managed Medicaid care management team member scheduled for:03/22/22 @ 11:15am  Lurena Joiner RN, [name] [place] RN Care Coordinator   Following is a copy of your plan of care:  Care Plan : RN Care Manager Plan of Care   Updates made by Melissa Montane, RN since 02/18/2022 12:00 AM     Problem: Chronic Disease Management and Care Coordination Needs for CHF, HTN, DM   Priority: High     Long-Range Goal: Development of Plan of Care for Chronic Disease Management and Care Coordination Needs (CHF, HTN, DM)   Start Date: 09/30/2021  Expected End Date: 04/02/2022  Priority: High  Note:   Current Barriers:  Knowledge Deficits related to plan of care for management of CHF, HTN, and DMII  Chronic Disease Management support and education needs related to CHF, HTN, and DMII Difficulty obtaining medications Recent Vision 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  RNCM Clinical Goal(s):  Patient will verbalize understanding of plan for management of CHF, HTN, and DMII as evidenced by improvement in management of these chronic diseases. verbalize basic understanding of CHF, HTN, and DMII disease process and self health management plan as evidenced by improved control of factors that cause CHF, improved blood pressure readings < 140/90 and improved control of blood sugar readings with range with 80 - 120. take all medications exactly as prescribed and will call provider for medication related questions as evidenced by compliance with medications3/31/    attend all scheduled medical appointments: No future appointments scheduled as evidenced by attending all scheduled appointments        demonstrate improved adherence to prescribed treatment plan for CHF, HTN, and DMII as evidenced by better control of blood pressure, blood sugars and factors impacting CHF. continue to work with Consulting civil engineer and/or Social Worker to address care management and care coordination needs  related to CHF, HTN, and DMII as evidenced by adherence to CM Team Scheduled appointments     through collaboration with RN Care manager, provider, and care team.   Interventions: Inter-disciplinary care team  collaboration (see longitudinal plan of care) Evaluation of current treatment plan related to  self management and patient's adherence to plan as established by provider   Heart Failure Interventions:  (Status: Goal on Track (progressing): YES.)  Long Term Goal  Wt Readings from Last 3 Encounters:  02/09/22 190 lb (86.2 kg)  01/26/22 211 lb 3.2 oz (95.8 kg)  01/09/22 190 lb (86.2 kg)    Discussed importance of daily weight and advised patient to weigh and record daily Reviewed role of diuretics in prevention of fluid overload and management of heart failure Discussed the importance of keeping all appointments with provider Reviewed medications with patient's niece, advised to call for any needed refills prior to running out Advised patient to take all medications and BP readings to all upcoming appointments Discussed compliance packaging benefits, patient would like to switch to compliance packaging(his niece prepares his medications currently) and plans to call after his PCP visit  Diabetes:  (Status: Goal on Track (progressing): YES.) Long Term Goal  Lab Results  Component Value Date   HGBA1C 7.2 (H) 02/27/2021    Assessed patient's understanding of A1c goal: <7% Reviewed medications with patient and discussed importance of medication adherence;        Discussed plans with patient for ongoing care management follow up and provided patient with direct contact information for care management team;      Reviewed scheduled/upcoming provider appointments including: PCP 02/24/22;         Advised patient, providing education and rationale, to check cbg once daily and record        call provider for findings outside established parameters;       Patient checking blood sugar once a day in the evening.  Reports recent blood sugar 180, but they are typically lower    Hypertension: (Status: Goal on Track (progressing): YES.) Long Term Goal Patient is taking all medications, cutting back on salt  and checking BP daily Last practice recorded BP readings: at home 152/96 and 140/92 BP Readings from Last 3 Encounters:  02/09/22 (!) 158/80  01/26/22 (!) 144/96  01/10/22 (!) 197/120  Most recent eGFR/CrCl:  Lab Results  Component Value Date   EGFR 31 (L) 05/21/2021    No components found for: "CRCL"  Evaluation of current treatment plan related to hypertension self management and patient's adherence to plan as established by provider;   Reviewed prescribed diet managing HTN Reviewed medications with patient and discussed importance of compliance;  Advised patient, providing education and rationale, to monitor blood pressure daily and record, calling PCP for findings outside established parameters;  Reviewed scheduled/upcoming provider appointments including: 02/24/22 with PCP, 03/04/22 with Cardiology Pharmacist and 03/12/22 with Cardiology Advised patient to take BP readings and medications to upcoming provider appointments  Patient Goals/Self-Care Activities: Take medications as prescribed   Attend all scheduled provider appointments Call pharmacy for medication refills 3-7 days in advance of running out of medications Call provider office for new concerns or questions  check blood sugar at prescribed times: once daily check blood pressure daily limit salt intake to 2000 mg/day

## 2022-02-18 NOTE — Patient Outreach (Signed)
Medicaid Managed Care   Nurse Care Manager Note  02/18/2022 Name:  Todd Mccoy MRN:  [id number] DOB:  16-Dec-1970  Todd Mccoy is an 51 y.o. year old male who is [facility] primary patient of Todd Rakes, MD.  The [facility] Managed Care Coordination team was consulted for assistance with:    CHF HTN DMII  Todd Mccoy was given information about Medicaid Managed Care Coordination team services today. Todd Mccoy Patient agreed to services and verbal consent obtained.  Engaged with patient by telephone for follow up visit in response to provider referral for case management and/or care coordination services.   Assessments/Interventions:  Review of past medical history, allergies, medications, health status, including review of consultants reports, laboratory and other test data, was performed as part of comprehensive evaluation and provision of chronic care management services.  SDOH (Social Determinants of Health) assessments and interventions performed:   Care Plan  No Known Allergies  Medications Reviewed Today     Reviewed by Todd Montane, RN (Registered Nurse) on 02/18/22 at 1144  Med List Status: <None>   Medication Order Taking? Sig Documenting Provider Last Dose Status Informant  acetaminophen-codeine (TYLENOL #3) 300-30 MG tablet [id number] Yes Take 1 tablet by mouth every 12 (twelve) hours as needed for moderate pain. Todd Slick, NP Taking Active Self  amLODipine (NORVASC) 10 MG tablet [id number] Yes Take 1 tablet (10 mg total) by mouth daily. [name], MD Taking Active Self  atorvastatin (LIPITOR) 20 MG tablet [id number] Yes Take 1 tablet (20 mg total) by mouth daily. [name], MD Taking Active Self  carvedilol (COREG) 25 MG tablet [id number] Yes Take 1 tablet (25 mg total) by mouth 2 (two) times daily. [name], MD Taking Active Self  ferrous sulfate 325 (65 FE) MG tablet [id number]  Take 1 tablet (325 mg total) by mouth 3 (three)  times daily with meals.  Patient taking differently: Take 325 mg by mouth daily with breakfast.   Todd James, MD  Expired 01/23/22 2359 Self  furosemide (LASIX) 80 MG tablet [id number] Yes Take 1 tablet (80 mg total) by mouth daily. Todd Merino, MD Taking Active   hydrALAZINE (APRESOLINE) 25 MG tablet [id number] Yes Take 1 tablet (25 mg total) by mouth 3 (three) times daily. Todd Mccoy, [place] Taking Active     Discontinued 05/13/20 1120   insulin glargine (LANTUS) 100 UNIT/ML Solostar Pen [id number] Yes Inject 10 Units into the skin at bedtime. Todd Dike, MD Taking Active Self  isosorbide mononitrate (IMDUR) 60 MG 24 hr tablet [id number] Yes Take 1 tablet (60 mg total) by mouth daily. [name], MD Taking Active Self    Discontinued 12/22/20 1205 (Stop Taking at Discharge)   Naphazoline HCl (CLEAR EYES OP) [id number] Yes Place 1 drop into both eyes as needed (irritation). [provider] Taking Active Self  polyethylene glycol powder (GLYCOLAX/MIRALAX) 17 GM/SCOOP powder [id number] No Take 17 g by mouth 2 (two) times daily as needed.  Patient not taking: Reported on 02/18/2022   Todd Rakes, MD Not Taking Active Self  pregabalin (LYRICA) 50 MG capsule [id number] No Take 50 mg by mouth 2 (two) times daily.  Patient not taking: Reported on 02/18/2022   [provider] Not Taking Active Self           Med Note (Todd Mccoy   Thu Feb 18, 2022 11:44 AM) Needs Mccoy refill  Patient Active Problem List   Diagnosis Date Noted   Renal insufficiency    Acute on chronic systolic CHF (congestive heart failure) ([place]) 01/23/2022   Prolonged QT interval 01/23/2022   Chest pain 01/23/2022   Elevated troponin 01/23/2022   Anemia due to chronic kidney disease 01/23/2022   Hypertensive emergency 01/23/2022   Hyponatremia 01/23/2022   Chronic combined systolic (congestive) and diastolic (congestive) heart failure ([place]) 04/07/2021   Chronic renal  disease, stage 4, severely decreased glomerular filtration rate between 15-29 mL/min/1.73 square meter (HCC)    Hypertensive urgency    Acute kidney injury superimposed on chronic kidney disease ([place])    Acute CHF (congestive heart failure) ([place]) 02/27/2021   Normocytic anemia 02/27/2021   Anasarca 02/27/2021   Abscess of left foot 12/10/2020   S/P BKA (below knee amputation) unilateral, left ([place]) 12/10/2020   Dyslipidemia 12/10/2020   CKD (chronic kidney disease), stage III ([place]) 12/10/2020   Subacute osteomyelitis, left ankle and foot ([place])    Osteomyelitis of fifth toe of left foot ([place])    Severe protein-calorie malnutrition ([place])    HTN (hypertension) 11/12/2020   Osteomyelitis of second toe of right foot ([place])    Cellulitis and abscess of foot    Toe osteomyelitis ([place]) 03/24/2020   Necrotizing fasciitis ([place]) 09/08/2014   Diabetes mellitus with hyperglycemia ([place]) 09/07/2014   Tobacco abuse 09/07/2014   Abscess of back    Abscess of lower back 09/06/2014   DM2 (diabetes mellitus, type 2) ([place]) 09/06/2014   Sepsis ([place]) 09/06/2014   Scrotal abscess 06/20/2014    Conditions to be addressed/monitored per PCP order:  CHF, HTN, and DMII  Care Plan : RN Care Manager Plan of Care  Updates made by Todd Montane, RN since 02/18/2022 12:00 AM     Problem: Chronic Disease Management and Care Coordination Needs for CHF, HTN, DM   Priority: High     Long-Range Goal: Development of Plan of Care for Chronic Disease Management and Care Coordination Needs (CHF, HTN, DM)   Start Date: 09/30/2021  Expected End Date: 04/02/2022  Priority: High  Note:   Current Barriers:  Knowledge Deficits related to plan of care for management of CHF, HTN, and DMII  Chronic Disease Management support and education needs related to CHF, HTN, and DMII Difficulty obtaining medications Recent Vision 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  RNCM Clinical  Goal(s):  Patient will verbalize understanding of plan for management of CHF, HTN, and DMII as evidenced by improvement in management of these chronic diseases. verbalize basic understanding of CHF, HTN, and DMII disease process and self health management plan as evidenced by improved control of factors that cause CHF, improved blood pressure readings < 140/90 and improved control of blood sugar readings with range with 80 - 120. take all medications exactly as prescribed and will call provider for medication related questions as evidenced by compliance with medications3/31/    attend all scheduled medical appointments: No future appointments scheduled as evidenced by attending all scheduled appointments        demonstrate improved adherence to prescribed treatment plan for CHF, HTN, and DMII as evidenced by better control of blood pressure, blood sugars and factors impacting CHF. continue to work with Consulting civil engineer and/or Social Worker to address care management and care coordination needs related to CHF, HTN, and DMII as evidenced by adherence to CM Team Scheduled appointments     through collaboration with  RN Care manager, provider, and care team.   Interventions: Inter-disciplinary care team collaboration (see longitudinal plan of care) Evaluation of current treatment plan related to  self management and patient's adherence to plan as established by provider   Heart Failure Interventions:  (Status: Goal on Track (progressing): YES.)  Long Term Goal  Wt Readings from Last 3 Encounters:  02/09/22 190 lb (86.2 kg)  01/26/22 211 lb 3.2 oz (95.8 kg)  01/09/22 190 lb (86.2 kg)    Discussed importance of daily weight and advised patient to weigh and record daily Reviewed role of diuretics in prevention of fluid overload and management of heart failure Discussed the importance of keeping all appointments with provider Reviewed medications with patient's niece, advised to call for any needed  refills prior to running out Advised patient to take all medications and BP readings to all upcoming appointments Discussed compliance packaging benefits, patient would like to switch to compliance packaging(his niece prepares his medications currently) and plans to call after his PCP visit  Diabetes:  (Status: Goal on Track (progressing): YES.) Long Term Goal  Lab Results  Component Value Date   HGBA1C 7.2 (H) 02/27/2021    Assessed patient's understanding of A1c goal: <7% Reviewed medications with patient and discussed importance of medication adherence;        Discussed plans with patient for ongoing care management follow up and provided patient with direct contact information for care management team;      Reviewed scheduled/upcoming provider appointments including: PCP 02/24/22;         Advised patient, providing education and rationale, to check cbg once daily and record        call provider for findings outside established parameters;       Patient checking blood sugar once Mccoy day in the evening.  Reports recent blood sugar 180, but they are typically lower    Hypertension: (Status: Goal on Track (progressing): YES.) Long Term Goal Patient is taking all medications, cutting back on salt and checking BP daily Last practice recorded BP readings: at home 152/96 and 140/92 BP Readings from Last 3 Encounters:  02/09/22 (!) 158/80  01/26/22 (!) 144/96  01/10/22 (!) 197/120  Most recent eGFR/CrCl:  Lab Results  Component Value Date   EGFR 31 (L) 05/21/2021    No components found for: "CRCL"  Evaluation of current treatment plan related to hypertension self management and patient's adherence to plan as established by provider;   Reviewed prescribed diet managing HTN Reviewed medications with patient and discussed importance of compliance;  Advised patient, providing education and rationale, to monitor blood pressure daily and record, calling PCP for findings outside established  parameters;  Reviewed scheduled/upcoming provider appointments including: 02/24/22 with PCP, 03/04/22 with Cardiology Pharmacist and 03/12/22 with Cardiology Advised patient to take BP readings and medications to upcoming provider appointments  Patient Goals/Self-Care Activities: Take medications as prescribed   Attend all scheduled provider appointments Call pharmacy for medication refills 3-7 days in advance of running out of medications Call provider office for new concerns or questions  check blood sugar at prescribed times: once daily check blood pressure daily limit salt intake to 2000 mg/day       Follow Up:  Patient agrees to Care Plan and Follow-up.  Plan: The Managed Medicaid care management team will reach out to the patient again over the next 30 days.  Date/time of next scheduled RN care management/care coordination outreach:  03/22/22 @ 11:15am  Lurena Joiner RN, [name] [facility]  [employer] manager

## 2022-02-21 ENCOUNTER — Other Ambulatory Visit: Payer: Self-pay

## 2022-02-21 ENCOUNTER — Emergency Department (HOSPITAL_COMMUNITY): Payer: Medicaid Other

## 2022-02-21 ENCOUNTER — Emergency Department (HOSPITAL_COMMUNITY)
Admission: EM | Admit: 2022-02-21 | Discharge: 2022-02-21 | Disposition: A | Discharge status: Home / Self Care | Payer: Medicaid Other | Attending: Emergency Medicine | Admitting: Emergency Medicine

## 2022-02-21 ENCOUNTER — Encounter (HOSPITAL_COMMUNITY): Payer: Self-pay

## 2022-02-21 DIAGNOSIS — I129 Hypertensive chronic kidney disease with stage 1 through stage 4 chronic kidney disease, or unspecified chronic kidney disease: Secondary | ICD-10-CM | POA: Diagnosis not present

## 2022-02-21 DIAGNOSIS — N184 Chronic kidney disease, stage 4 (severe): Secondary | ICD-10-CM | POA: Insufficient documentation

## 2022-02-21 DIAGNOSIS — Z794 Long term (current) use of insulin: Secondary | ICD-10-CM | POA: Diagnosis not present

## 2022-02-21 DIAGNOSIS — Z79899 Other long term (current) drug therapy: Secondary | ICD-10-CM | POA: Diagnosis not present

## 2022-02-21 DIAGNOSIS — E1122 Type 2 diabetes mellitus with diabetic chronic kidney disease: Secondary | ICD-10-CM | POA: Insufficient documentation

## 2022-02-21 DIAGNOSIS — Z7984 Long term (current) use of oral hypoglycemic drugs: Secondary | ICD-10-CM | POA: Diagnosis not present

## 2022-02-21 DIAGNOSIS — I2510 Atherosclerotic heart disease of native coronary artery without angina pectoris: Secondary | ICD-10-CM | POA: Diagnosis not present

## 2022-02-21 DIAGNOSIS — R079 Chest pain, unspecified: Secondary | ICD-10-CM | POA: Diagnosis not present

## 2022-02-21 DIAGNOSIS — J90 Pleural effusion, not elsewhere classified: Secondary | ICD-10-CM | POA: Diagnosis not present

## 2022-02-21 DIAGNOSIS — R0789 Other chest pain: Secondary | ICD-10-CM | POA: Diagnosis not present

## 2022-02-21 LAB — CBC WITH DIFFERENTIAL/PLATELET
Abs Immature Granulocytes: 0.02 10*3/uL (ref 0.00–0.07)
Basophils Absolute: 0 10*3/uL (ref 0.0–0.1)
Basophils Relative: 0 %
Eosinophils Absolute: 0.1 10*3/uL (ref 0.0–0.5)
Eosinophils Relative: 3 %
HCT: 30.1 % — ABNORMAL LOW (ref 39.0–52.0)
Hemoglobin: 9.5 g/dL — ABNORMAL LOW (ref 13.0–17.0)
Immature Granulocytes: 1 %
Lymphocytes Relative: 18 %
Lymphs Abs: 0.6 10*3/uL — ABNORMAL LOW (ref 0.7–4.0)
MCH: 27.6 pg (ref 26.0–34.0)
MCHC: 31.6 g/dL (ref 30.0–36.0)
MCV: 87.5 fL (ref 80.0–100.0)
Monocytes Absolute: 0.4 10*3/uL (ref 0.1–1.0)
Monocytes Relative: 11 %
Neutro Abs: 2.2 10*3/uL (ref 1.7–7.7)
Neutrophils Relative %: 67 %
Platelets: 164 10*3/uL (ref 150–400)
RBC: 3.44 MIL/uL — ABNORMAL LOW (ref 4.22–5.81)
RDW: 16.7 % — ABNORMAL HIGH (ref 11.5–15.5)
WBC: 3.3 10*3/uL — ABNORMAL LOW (ref 4.0–10.5)
nRBC: 0 % (ref 0.0–0.2)

## 2022-02-21 LAB — BASIC METABOLIC PANEL
Anion gap: 8 (ref 5–15)
BUN: 53 mg/dL — ABNORMAL HIGH (ref 6–20)
CO2: 20 mmol/L — ABNORMAL LOW (ref 22–32)
Calcium: 8.3 mg/dL — ABNORMAL LOW (ref 8.9–10.3)
Chloride: 109 mmol/L (ref 98–111)
Creatinine, Ser: 4.18 mg/dL — ABNORMAL HIGH (ref 0.61–1.24)
GFR, Estimated: 17 mL/min — ABNORMAL LOW (ref >60)
Glucose, Bld: 119 mg/dL — ABNORMAL HIGH (ref 70–99)
Potassium: 4.4 mmol/L (ref 3.5–5.1)
Sodium: 137 mmol/L (ref 135–145)

## 2022-02-21 LAB — BRAIN NATRIURETIC PEPTIDE: B Natriuretic Peptide: 4003.4 pg/mL — ABNORMAL HIGH (ref 0.0–100.0)

## 2022-02-21 LAB — TROPONIN I (HIGH SENSITIVITY)
Troponin I (High Sensitivity): 145 ng/L (ref <18)
Troponin I (High Sensitivity): 146 ng/L (ref <18)

## 2022-02-21 MED ORDER — AMLODIPINE BESYLATE 10 MG PO TABS: 10.0000 mg | ORAL_TABLET | Freq: Every day | ORAL | 0 refills | Status: DC

## 2022-02-21 MED ORDER — ISOSORBIDE MONONITRATE ER 30 MG PO TB24
60.0000 mg | ORAL_TABLET | Freq: Every day | ORAL | Status: DC
  Administered 2022-02-21: 60 mg via ORAL
  Filled 2022-02-21: qty 2

## 2022-02-21 MED ORDER — ISOSORBIDE MONONITRATE ER 60 MG PO TB24: 60.0000 mg | ORAL_TABLET | Freq: Every day | ORAL | 0 refills | Status: DC

## 2022-02-21 MED ORDER — PREGABALIN 50 MG PO CAPS
50.0000 mg | ORAL_CAPSULE | Freq: Two times a day (BID) | ORAL | 0 refills | Status: DC
Start: 2022-02-21 — End: 2022-03-17

## 2022-02-21 MED ORDER — HYDRALAZINE HCL 25 MG PO TABS
25.0000 mg | ORAL_TABLET | Freq: Three times a day (TID) | ORAL | Status: DC
  Administered 2022-02-21: 25 mg via ORAL
  Filled 2022-02-21: qty 1

## 2022-02-21 MED ORDER — FUROSEMIDE 10 MG/ML IJ SOLN
40.0000 mg | Freq: Once | INTRAMUSCULAR | Status: AC
  Administered 2022-02-21: 40 mg via INTRAVENOUS
  Filled 2022-02-21: qty 4

## 2022-02-21 NOTE — ED Provider Triage Note (Signed)
Emergency Medicine Provider Triage Evaluation Note  Todd Mccoy , a 51 y.o. male  was evaluated in triage.  Pt complains of chest pain onset earlier today.  Denies shortness of breath.  Patient has history of CHF.  Reports he is out of his isosorbide, and Lasix.  Review of Systems  Positive: As above Negative: As above  Physical Exam  BP (!) 179/107 (BP Location: Right Arm)   Pulse 71   Temp 98 F (36.7 C) (Oral)   Resp 16   SpO2 99%  Gen:   Awake, no distress   Resp:  Normal effort  MSK:   Moves extremities without difficulty  Other:    Medical Decision Making  Medically screening exam initiated at 2:04 PM.  Appropriate orders placed.  Todd Mccoy was informed that the remainder of the evaluation will be completed by another provider, this initial triage assessment does not replace that evaluation, and the importance of remaining in the ED until their evaluation is complete.    Evlyn Courier, PA-C 02/21/22 [phone]

## 2022-02-21 NOTE — ED Notes (Signed)
Discharge instructions dicussed with pt. Pt verbalized understanding with no questions at this time. Pt transferred self onto wheelchair, to go home with niece at bedside.

## 2022-02-21 NOTE — ED Provider Notes (Signed)
[facility] EMERGENCY DEPARTMENT Provider Note   CSN: [id number] Arrival date & time: 02/21/22  1256     History  No chief complaint on file.   Todd Mccoy is a 51 y.o. male.  Pt reports he is out of his imdur and began having some chest discomfort today.  Pt reports he is currently pain free.  Pt reports he has not taken his mid day blood pressure medications.  Pt reports his blood pressure was lower earlier today.  Pt denies any increased shortness of breath.     Chest Pain Pain location:  Unable to specify Pain quality: aching   Pain radiates to:  Does not radiate Timing:  Constant Progression:  Worsening Chronicity:  New Relieved by:  Nothing Worsened by:  Nothing Ineffective treatments:  None tried Associated symptoms: no abdominal pain   Risk factors: coronary artery disease and diabetes mellitus        Home Medications Prior to Admission medications   Medication Sig Start Date End Date Taking? Authorizing Provider  acetaminophen-codeine (TYLENOL #3) 300-30 MG tablet Take 1 tablet by mouth every 12 (twelve) hours as needed for moderate pain. 01/19/22   Suzan Slick, NP  amLODipine (NORVASC) 10 MG tablet Take 1 tablet (10 mg total) by mouth daily. 02/21/22   Fransico Meadow, PA-C  atorvastatin (LIPITOR) 20 MG tablet Take 1 tablet (20 mg total) by mouth daily. 04/23/21   [name], MD  carvedilol (COREG) 25 MG tablet Take 1 tablet (25 mg total) by mouth 2 (two) times daily. 05/21/21   [name], MD  ferrous sulfate 325 (65 FE) MG tablet Take 1 tablet (325 mg total) by mouth 3 (three) times daily with meals. Patient taking differently: Take 325 mg by mouth daily with breakfast. 12/19/20 01/23/22  Deatra James, MD  furosemide (LASIX) 80 MG tablet Take 1 tablet (80 mg total) by mouth daily. 01/26/22   Barb Merino, MD  hydrALAZINE (APRESOLINE) 25 MG tablet Take 1 tablet (25 mg total) by mouth 3 (three) times daily. 02/09/22    Elgie Collard, PA-C  insulin glargine (LANTUS) 100 UNIT/ML Solostar Pen Inject 10 Units into the skin at bedtime. 03/06/21 03/06/22  Kathie Dike, MD  isosorbide mononitrate (IMDUR) 60 MG 24 hr tablet Take 1 tablet (60 mg total) by mouth daily. 02/21/22   Fransico Meadow, PA-C  Naphazoline HCl (CLEAR EYES OP) Place 1 drop into both eyes as needed (irritation).    [provider]  polyethylene glycol powder (GLYCOLAX/MIRALAX) 17 GM/SCOOP powder Take 17 g by mouth 2 (two) times daily as needed. Patient not taking: Reported on 02/18/2022 01/21/21   [name], MD  pregabalin (LYRICA) 50 MG capsule Take 1 capsule (50 mg total) by mouth 2 (two) times daily. 02/21/22   Fransico Meadow, PA-C  insulin aspart protamine- aspart (NOVOLOG MIX 70/30) (70-30) 100 UNIT/ML injection Inject 0.35 mLs (35 Units total) into the skin 2 (two) times daily. 04/17/20 05/13/20  Argentina Donovan, PA-C  metFORMIN (GLUCOPHAGE-XR) 500 MG 24 hr tablet TAKE 1 TABLET (500 MG TOTAL) BY MOUTH 2 (TWO) TIMES DAILY. 12/19/20 12/22/20  Deatra James, MD      Allergies    Patient has no known allergies.    Review of Systems   Review of Systems  Cardiovascular:  Positive for chest pain.  Gastrointestinal:  Negative for abdominal pain.  All other systems reviewed and are negative.   Physical Exam Updated Vital  Signs BP (!) 185/111   Pulse 72   Temp 98.3 F (36.8 C)   Resp (!) 26   SpO2 96%  Physical Exam Vitals and nursing note reviewed.  Constitutional:      General: He is not in acute distress.    Appearance: He is well-developed.  HENT:     Head: Normocephalic and atraumatic.  Eyes:     Conjunctiva/sclera: Conjunctivae normal.  Cardiovascular:     Rate and Rhythm: Normal rate and regular rhythm.     Heart sounds: No murmur heard. Pulmonary:     Effort: Pulmonary effort is normal. No respiratory distress.     Breath sounds: Normal breath sounds.  Abdominal:     Palpations: Abdomen is soft.      Tenderness: There is no abdominal tenderness.  Musculoskeletal:     Cervical back: Neck supple.     Comments: bka  Skin:    General: Skin is warm and dry.     Capillary Refill: Capillary refill takes less than 2 seconds.  Neurological:     Mental Status: He is alert.  Psychiatric:        Mood and Affect: Mood normal.     ED Results / Procedures / Treatments   Labs (all labs ordered are listed, but only abnormal results are displayed) Labs Reviewed  CBC WITH DIFFERENTIAL/PLATELET - Abnormal; Notable for the following components:      Result Value   WBC 3.3 (*)    RBC 3.44 (*)    Hemoglobin 9.5 (*)    HCT 30.1 (*)    RDW 16.7 (*)    Lymphs Abs 0.6 (*)    All other components within normal limits  BASIC METABOLIC PANEL - Abnormal; Notable for the following components:   CO2 20 (*)    Glucose, Bld 119 (*)    BUN 53 (*)    Creatinine, Ser 4.18 (*)    Calcium 8.3 (*)    GFR, Estimated 17 (*)    All other components within normal limits  BRAIN NATRIURETIC PEPTIDE - Abnormal; Notable for the following components:   B Natriuretic Peptide 4,003.4 (*)    All other components within normal limits  TROPONIN I (HIGH SENSITIVITY) - Abnormal; Notable for the following components:   Troponin I (High Sensitivity) 145 (*)    All other components within normal limits  TROPONIN I (HIGH SENSITIVITY) - Abnormal; Notable for the following components:   Troponin I (High Sensitivity) 146 (*)    All other components within normal limits    EKG EKG Interpretation  Date/Time:  Sunday February 21 2022 17:41:50 EDT Ventricular Rate:  71 PR Interval:  157 QRS Duration: 106 QT Interval:  431 QTC Calculation: 469 R Axis:   178 Text Interpretation: Sinus rhythm Right axis deviation Borderline low voltage, extremity leads No significant change since last tracing Confirmed by Isla Pence [phone]) on 02/21/2022 5:44:52 PM  Radiology DG Chest 2 View  Result Date: 02/21/2022 CLINICAL DATA:   Chest pain EXAM: CHEST - 2 VIEW COMPARISON:  01/23/2022 FINDINGS: No significant change in chest radiographs. Cardiomegaly with diffuse bilateral interstitial pulmonary opacity. Unchanged, moderate right pleural effusion. No new airspace opacity. Osseous structures unremarkable. IMPRESSION: 1. No significant change in chest radiographs. Cardiomegaly with diffuse bilateral interstitial pulmonary opacity, likely edema. No new airspace opacity. 2.  Unchanged, moderate right pleural effusion. Electronically Signed   By: Delanna Ahmadi M.D.   On: 02/21/2022 15:39    Procedures Procedures  Medications Ordered in ED Medications  hydrALAZINE (APRESOLINE) tablet 25 mg (25 mg Oral Given 02/21/22 1833)  isosorbide mononitrate (IMDUR) 24 hr tablet 60 mg (60 mg Oral Given 02/21/22 1833)  furosemide (LASIX) injection 40 mg (40 mg Intravenous Given 02/21/22 1832)    ED Course/ Medical Decision Making/ A&P                           Medical Decision Making Pt complains of being out of his medicaitons   Amount and/or Complexity of Data Reviewed Independent Historian:     Details: Pt here with his family. External Data Reviewed: notes.    Details: cardiology notes reviewed Labs: ordered. Decision-making details documented in ED Course.    Details: labs ordered reviewed and interpreted.  Troponin is 145 and 146. Radiology: ordered and independent interpretation performed. Decision-making details documented in ED Course.    Details: Chest xray  no significant changes. ECG/medicine tests: ordered and independent interpretation performed. Decision-making details documented in ED Course.    Details: no acute changes  Risk Prescription drug management. Risk Details: Dr. Gilford Raid in to see and examine.  Pt given imdur and hydralazine.  Pt given lasix 40 mg IV.    Prescriptions sent to pt's pharmacy            Final Clinical Impression(s) / ED Diagnoses Final diagnoses:  Hypertension associated with  stage 4 chronic kidney disease due to type 2 diabetes mellitus ([place])    Rx / DC Orders ED Discharge Orders          Ordered    amLODipine (NORVASC) 10 MG tablet  Daily       Note to Pharmacy: Pt must keep upcoming appt in July 2023 with Cardiologist before anymore refills. Thank you final Attempt   02/21/22 1846    isosorbide mononitrate (IMDUR) 60 MG 24 hr tablet  Daily       Note to Pharmacy: Pt must keep upcoming appt in July 2023 with Cardiologist before anymore refills. Thank you final Attempt   02/21/22 1846    pregabalin (LYRICA) 50 MG capsule  2 times daily        02/21/22 1846           An After Visit Summary was printed and given to the patient.    Sidney Ace 02/21/22 Ignacia Felling, MD 02/21/22 [phone]

## 2022-02-21 NOTE — ED Triage Notes (Signed)
Patient requesting refill for his Imdur. Denies cp at present but reports had some earlier today. NAD

## 2022-02-21 NOTE — Discharge Instructions (Addendum)
Return if any problems.  See your Physician as scheduled

## 2022-02-22 ENCOUNTER — Telehealth: Payer: Self-pay | Admitting: Licensed Clinical Social Worker

## 2022-02-22 NOTE — Patient Outreach (Signed)
  Care Coordination [facility] Note  Transition Care Management Follow-up Telephone Call Date of discharge and from where: 02/21/22 from [facility]. Patient is already enrolled in our [facility] program and has follow up visit scheduled with [place].  How have you been since you were released from the hospital? I am doing a lot better. I do not have any chest pain anymore.  Any questions or concerns? No  Items Reviewed: Did the pt receive and understand the discharge instructions provided? Yes  Medications obtained and verified? No  Patient's niece will be picking these up today. Other? No  Any new allergies since your discharge? No  Dietary orders reviewed? No Do you have support at home? Yes   Home Care and Equipment/Supplies: Were home health services ordered? no If so, what is the name of the agency? na  Has the agency set up a time to come to the patient's home? not applicable Were any new equipment or medical supplies ordered?  No What is the name of the medical supply agency? na Were you able to get the supplies/equipment? not applicable Do you have any questions related to the use of the equipment or supplies? No  Functional Questionnaire: (I = Independent and D = Dependent) ADLs: I  Bathing/Dressing- I  Meal Prep- I  Eating- I  Maintaining continence- I  Transferring/Ambulation- I  Managing Meds- D (Patient has ran out of his medications in the past)  Follow up appointments reviewed:  PCP Hospital f/u appt confirmed? Yes  Scheduled to see PCP on 8/23 @ 3:30 pm. [facility] f/u appt confirmed? No   Are transportation arrangements needed? No  If their condition worsens, is the pt aware to call PCP or go to the Emergency Dept.? Yes Was the patient provided with contact information for the PCP's office or ED? Yes Was to pt encouraged to call back with questions or concerns? Yes  Eula Fried, BSW, MSW, [employer] Managed Medicaid [email or web address] Phone: [phone]

## 2022-02-24 ENCOUNTER — Ambulatory Visit: Payer: Medicaid Other | Attending: Family Medicine | Admitting: Family Medicine

## 2022-02-24 ENCOUNTER — Encounter: Payer: Self-pay | Admitting: Family Medicine

## 2022-02-24 VITALS — BP 188/108 | HR 70 | Temp 98.3°F | Ht 70.0 in

## 2022-02-24 DIAGNOSIS — Z794 Long term (current) use of insulin: Secondary | ICD-10-CM

## 2022-02-24 DIAGNOSIS — Z89512 Acquired absence of left leg below knee: Secondary | ICD-10-CM

## 2022-02-24 DIAGNOSIS — E1122 Type 2 diabetes mellitus with diabetic chronic kidney disease: Secondary | ICD-10-CM | POA: Diagnosis not present

## 2022-02-24 DIAGNOSIS — E11628 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 complications: Secondary | ICD-10-CM

## 2022-02-24 DIAGNOSIS — I129 Hypertensive chronic kidney disease with stage 1 through stage 4 chronic kidney disease, or unspecified chronic kidney disease: Secondary | ICD-10-CM

## 2022-02-24 DIAGNOSIS — N184 Chronic kidney disease, stage 4 (severe): Secondary | ICD-10-CM

## 2022-02-24 DIAGNOSIS — G4701 Insomnia due to medical condition: Secondary | ICD-10-CM | POA: Diagnosis not present

## 2022-02-24 DIAGNOSIS — R600 Localized edema: Secondary | ICD-10-CM | POA: Diagnosis not present

## 2022-02-24 LAB — POCT GLYCOSYLATED HEMOGLOBIN (HGB A1C): HbA1c, POC (controlled diabetic range): 6.9 % (ref 0.0–7.0)

## 2022-02-24 LAB — GLUCOSE, POCT (MANUAL RESULT ENTRY): POC Glucose: 209 mg/dl — AB (ref 70–99)

## 2022-02-24 MED ORDER — INSULIN GLARGINE 100 UNIT/ML SOLOSTAR PEN: 10.0000 [IU] | PEN_INJECTOR | Freq: Every day | SUBCUTANEOUS | 3 refills | Status: DC

## 2022-02-24 MED ORDER — HYDROXYZINE HCL 25 MG PO TABS: 25.0000 mg | ORAL_TABLET | Freq: Every evening | ORAL | 1 refills | Status: DC | PRN

## 2022-02-24 MED ORDER — FUROSEMIDE 80 MG PO TABS: 80.0000 mg | ORAL_TABLET | Freq: Every day | ORAL | 0 refills | Status: DC

## 2022-02-24 NOTE — Patient Instructions (Signed)
Edema ? ?Edema is when you have too much fluid in your body or under your skin. Edema may make your legs, feet, and ankles swell. Swelling often happens in looser tissues, such as around your eyes. This is a common condition. It gets more common as you get older. ?There are many possible causes of edema. These include: ?Eating too much salt (sodium). ?Being on your feet or sitting for a long time. ?Certain medical conditions, such as: ?Pregnancy. ?Heart failure. ?Liver disease. ?Kidney disease. ?Cancer. ?Hot weather may make edema worse. Edema is usually painless. Your skin may look swollen or shiny. ?Follow these instructions at home: ?Medicines ?Take over-the-counter and prescription medicines only as told by your doctor. ?Your doctor may prescribe a medicine to help your body get rid of extra water (diuretic). Take this medicine if you are told to take it. ?Eating and drinking ?Eat a low-salt (low-sodium) diet as told by your doctor. Sometimes, eating less salt may reduce swelling. ?Depending on the cause of your swelling, you may need to limit how much fluid you drink (fluid restriction). ?General instructions ?Raise the injured area above the level of your heart while you are sitting or lying down. ?Do not sit still or stand for a long time. ?Do not wear tight clothes. Do not wear garters on your upper legs. ?Exercise your legs. This can help the swelling go down. ?Wear compression stockings as told by your doctor. It is important that these are the right size. These should be prescribed by your doctor to prevent possible injuries. ?If elastic bandages or wraps are recommended, use them as told by your doctor. ?Contact a doctor if: ?Treatment is not working. ?You have heart, liver, or kidney disease and have symptoms of edema. ?You have sudden and unexplained weight gain. ?Get help right away if: ?You have shortness of breath or chest pain. ?You cannot breathe when you lie down. ?You have pain, redness, or  warmth in the swollen areas. ?You have heart, liver, or kidney disease and get edema all of a sudden. ?You have a fever and your symptoms get worse all of a sudden. ?These symptoms may be an emergency. Get help right away. Call 911. ?Do not wait to see if the symptoms will go away. ?Do not drive yourself to the hospital. ?Summary ?Edema is when you have too much fluid in your body or under your skin. ?Edema may make your legs, feet, and ankles swell. Swelling often happens in looser tissues, such as around your eyes. ?Raise the injured area above the level of your heart while you are sitting or lying down. ?Follow your doctor's instructions about diet and how much fluid you can drink. ?This information is not intended to replace advice given to you by your health care provider. Make sure you discuss any questions you have with your health care provider. ?Document Revised: 02/23/2021 Document Reviewed: 02/23/2021 ?Elsevier Patient Education ? 2023 Elsevier Inc. ? ?

## 2022-02-24 NOTE — Progress Notes (Unsigned)
Subjective:  Patient ID: Todd Mccoy, male    DOB: Apr 10, 1971  Age: 51 y.o. MRN: [id number]  CC: Diabetes   HPI Todd Mccoy is a 51 y.o. 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 Accompanied by his niece to today's visit.  Interval History: He states he is 'so-so'.  He had an ED visit 3 days ago when he presented with chest pain after he ran out of isosorbide.  He had seen the cardiology NP 2 weeks prior but he states his isosorbide was not refilled. Hydralazine was added at his cardiology visit. His BP is elevated and he only took Isosorbide and Carvedilol but did not take Amlodipine and Hydralazine.  He endorses having all his medications at home. Scheduled to see the cardiology pharmacist in 1 week for blood pressure follow-up. He did have myocardial amyloid imaging which was equivocal for amyloidosis.  He sees cardiology early next month for follow-up.  He has not seen his Nephrologist and needs to make an appointment.  From a diabetes standpoint his A1c is 6.9 he has not had any hypoglycemic episodes.  Endorses adherence with Lantus. He continues to smoke and is working on quitting but declines initiation of pharmacotherapy to aid smoking cessation.  He is requesting for medication for insomnia. Past Medical History:  Diagnosis Date   Anemia    Anemia    Anxiety    Chronic combined systolic (congestive) and diastolic (congestive) heart failure (HCC)    EF 40-45% with G3DD on echo 2022   CKD (chronic kidney disease), stage IV ([place]) 12/10/2020   Diabetes mellitus with complication (HCC)    Diabetic neuropathy ([place])    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[place]) 03/25/2020   pt states retinal eye damage- left worse than the right- recent visited MD    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[place]) 03/25/2020   pt  states recent MD visit /left eye worse than right   Dyslipidemia 12/10/2020   Hx of BKA, left ([place])    Hypertension    Morbid obesity ([place])    Osteomyelitis ([place])    Pneumonia     Past Surgical History:  Procedure Laterality Date   ABSCESS DRAINAGE     neck   AMPUTATION Left 11/14/2020   Procedure: LEFT 5TH RAY AMPUTATION;  Surgeon: Newt Minion, MD;  Location: [facility];  Service: Orthopedics;  Laterality: Left;   AMPUTATION Left 12/10/2020   Procedure: LEFT BELOW KNEE AMPUTATION;  Surgeon: Newt Minion, MD;  Location: [facility];  Service: Orthopedics;  Laterality: Left;   AMPUTATION TOE Right 03/25/2020   Procedure: AMPUTATION TOE;  Surgeon: Evelina Bucy, DPM;  Location: [facility];  Service: Podiatry;  Laterality: Right;   INCISION AND DRAINAGE ABSCESS Right 09/07/2014   Procedure: INCISION AND DRAINAGE ABSCESS RIGHT FLANK;  Surgeon: Jackolyn Confer, MD;  Location: [facility];  Service: General;  Laterality: Right;   LEG AMPUTATION BELOW KNEE Left 12/10/2020   SCROTUM EXPLORATION      Family History  Problem Relation Age of Onset   Diabetes Mother    Diabetes Father    Diabetes Brother    Colon cancer Neg Hx    Esophageal cancer Neg Hx    Pancreatic cancer Neg Hx    Stomach cancer Neg Hx    Liver disease Neg Hx  CAD Neg Hx     Social History   Socioeconomic History   Marital status: Single    Spouse name: Not on file   Number of children: 1   Years of education: Not on file   Highest education level: Not on file  Occupational History   Not on file  Tobacco Use   Smoking status: Some Days    Packs/day: 1.00    Years: 32.00    Total pack years: 32.00    Types: Cigarettes    Last attempt to quit: 08/24/2021    Years since quitting: 0.5    Passive exposure: Past   Smokeless tobacco: Former   Tobacco comments:    Quit smoking previously February 2023  Vaping Use   Vaping Use: Never used  Substance and Sexual Activity   Alcohol use: No   Drug use: No   Sexual  activity: Not on file  Other Topics Concern   Not on file  Social History Narrative   Not on file   Social Determinants of Health   Financial Resource Strain: Low Risk  (09/30/2021)   Overall Financial Resource Strain (CARDIA)    Difficulty of Paying Living Expenses: Not hard at all  Food Insecurity: No Food Insecurity (09/30/2021)   Hunger Vital Sign    Worried About Running Out of Food in the Last Year: Never true    [place] in the Last Year: Never true  Transportation Needs: No Transportation Needs (09/30/2021)   PRAPARE - Hydrologist (Medical): No    Lack of Transportation (Non-Medical): No  Physical Activity: Inactive (09/30/2021)   Exercise Vital Sign    Days of Exercise per Week: 0 days    Minutes of Exercise per Session: 0 min  Stress: No Stress Concern Present (09/30/2021)   [place]    Feeling of Stress : Only a little  Social Connections: Moderately Isolated (09/30/2021)   Social Connection and Isolation Panel [NHANES]    Frequency of Communication with Friends and Family: More than three times a week    Frequency of Social Gatherings with Friends and Family: More than three times a week    Attends Religious Services: 1 to 4 times per year    Active Member of [employer] or Organizations: No    Attends Archivist Meetings: Never    Marital Status: Never married    No Known Allergies  Outpatient Medications Prior to Visit  Medication Sig Dispense Refill   acetaminophen-codeine (TYLENOL #3) 300-30 MG tablet Take 1 tablet by mouth every 12 (twelve) hours as needed for moderate pain. 14 tablet 0   amLODipine (NORVASC) 10 MG tablet Take 1 tablet (10 mg total) by mouth daily. 30 tablet 0   atorvastatin (LIPITOR) 20 MG tablet Take 1 tablet (20 mg total) by mouth daily. 90 tablet 3   carvedilol (COREG) 25 MG tablet Take 1 tablet (25 mg total) by mouth 2 (two) times  daily. 180 tablet 3   hydrALAZINE (APRESOLINE) 25 MG tablet Take 1 tablet (25 mg total) by mouth 3 (three) times daily. 270 tablet 3   isosorbide mononitrate (IMDUR) 60 MG 24 hr tablet Take 1 tablet (60 mg total) by mouth daily. 30 tablet 0   Naphazoline HCl (CLEAR EYES OP) Place 1 drop into both eyes as needed (irritation).     pregabalin (LYRICA) 50 MG capsule Take 1 capsule (50 mg total) by mouth  2 (two) times daily. 30 capsule 0   furosemide (LASIX) 80 MG tablet Take 1 tablet (80 mg total) by mouth daily. 60 tablet 0   insulin glargine (LANTUS) 100 UNIT/ML Solostar Pen Inject 10 Units into the skin at bedtime. 9 mL 3   ferrous sulfate 325 (65 FE) MG tablet Take 1 tablet (325 mg total) by mouth 3 (three) times daily with meals. (Patient taking differently: Take 325 mg by mouth daily with breakfast.) 90 tablet 2   polyethylene glycol powder (GLYCOLAX/MIRALAX) 17 GM/SCOOP powder Take 17 g by mouth 2 (two) times daily as needed. (Patient not taking: Reported on 02/18/2022) 3350 g 1   No facility-administered medications prior to visit.     ROS Review of Systems  Constitutional:  Negative for activity change and appetite change.  HENT:  Negative for sinus pressure and sore throat.   Respiratory:  Negative for chest tightness, shortness of breath and wheezing.   Cardiovascular:  Positive for leg swelling. Negative for chest pain and palpitations.  Gastrointestinal:  Negative for abdominal distention, abdominal pain and constipation.  Genitourinary: Negative.   Musculoskeletal: Negative.   Psychiatric/Behavioral:  Positive for sleep disturbance. Negative for behavioral problems and dysphoric mood.     Objective:  BP (!) 188/108   Pulse 70   Temp 98.3 F (36.8 C) (Oral)   Ht 5\' 10"  (1.778 m)   SpO2 95%   BMI 27.26 kg/m      02/24/2022    4:03 PM 02/21/2022    7:26 PM 02/21/2022    5:38 PM  BP/Weight  Systolic BP 353 614 431  Diastolic BP 540 086 761      Physical  Exam Constitutional:      Appearance: He is well-developed.  Cardiovascular:     Rate and Rhythm: Normal rate.     Heart sounds: Normal heart sounds. No murmur heard. Pulmonary:     Effort: Pulmonary effort is normal.     Breath sounds: Normal breath sounds. No wheezing or rales.  Chest:     Chest wall: No tenderness.  Abdominal:     General: Bowel sounds are normal. There is no distension.     Palpations: Abdomen is soft. There is no mass.     Tenderness: There is no abdominal tenderness.  Musculoskeletal:        General: Normal range of motion.     Right lower leg: Edema (1+ edema of dorsum of foot) present.     Comments: Left BKA  Neurological:     Mental Status: He is alert and oriented to person, place, and time.  Psychiatric:        Mood and Affect: Mood normal.    Diabetic Foot Exam - Simple   Simple Foot Form Visual Inspection See comments: Yes Sensation Testing Intact to touch and monofilament testing bilaterally: Yes Pulse Check See comments: Yes Comments Left BKA Amputation of right second toe. Edema of dorsum of right foot Unable to palpate posterior tibialis and dorsalis pedis        Latest Ref Rng & Units 02/21/2022    2:04 PM 01/26/2022    2:09 AM 01/25/2022    4:33 AM  CMP  Glucose 70 - 99 mg/dL 119  163  119   BUN 6 - 20 mg/dL 53  44  42   Creatinine 0.61 - 1.24 mg/dL 4.18  4.48  4.30   Sodium 135 - 145 mmol/L 137  137  137   Potassium 3.5 -  5.1 mmol/L 4.4  4.3  4.1   Chloride 98 - 111 mmol/L 109  106  106   CO2 22 - 32 mmol/L 20  24  22    Calcium 8.9 - 10.3 mg/dL 8.3  7.5  7.5     Lipid Panel     Component Value Date/Time   CHOL 82 (L) 05/21/2021 1057   TRIG 67 05/21/2021 1057   HDL 27 (L) 05/21/2021 1057   CHOLHDL 3.0 05/21/2021 1057   LDLCALC 40 05/21/2021 1057   LDLDIRECT 36 05/21/2021 1057    CBC    Component Value Date/Time   WBC 3.3 (L) 02/21/2022 1404   RBC 3.44 (L) 02/21/2022 1404   HGB 9.5 (L) 02/21/2022 1404   HGB 8.4  (L) 04/17/2020 1716   HCT 30.1 (L) 02/21/2022 1404   HCT 26.3 (L) 04/17/2020 1716   PLT 164 02/21/2022 1404   PLT 197 04/17/2020 1716   MCV 87.5 02/21/2022 1404   MCV 85 04/17/2020 1716   MCH 27.6 02/21/2022 1404   MCHC 31.6 02/21/2022 1404   RDW 16.7 (H) 02/21/2022 1404   RDW 14.8 04/17/2020 1716   LYMPHSABS 0.6 (L) 02/21/2022 1404   LYMPHSABS 1.9 04/17/2020 1716   MONOABS 0.4 02/21/2022 1404   EOSABS 0.1 02/21/2022 1404   EOSABS 0.1 04/17/2020 1716   BASOSABS 0.0 02/21/2022 1404   BASOSABS 0.0 04/17/2020 1716    Lab Results  Component Value Date   HGBA1C 6.9 02/24/2022    Assessment & Plan:  1. 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 complication, unspecified whether long term insulin use (HCC) A1c 6.9 Continue current regimen Counseled on Diabetic diet, my plate method, 503 minutes of moderate intensity exercise/week Blood sugar logs with fasting goals of 80-120 mg/dl, random of less than 180 and in the event of sugars less than 60 mg/dl or greater than 400 mg/dl encouraged to notify the clinic. Advised on the need for annual eye exams, annual foot exams, Pneumonia vaccine. - POCT glucose (manual entry) - POCT glycosylated hemoglobin (Hb A1C) - insulin glargine (LANTUS) 100 UNIT/ML Solostar Pen; Inject 10 Units into the skin at bedtime.  Dispense: 9 mL; Refill: 3  2. Hypertension associated with stage 4 chronic kidney disease due to type 2 diabetes mellitus ([place]) Uncontrolled He only took some of his antihypertensives Medication adherence emphasized He has an upcoming appointment in 1 week with the cardiology pharmacist for BP evaluation Counseled on blood pressure goal of less than 130/80, low-sodium, DASH diet, medication compliance, 150 minutes of moderate intensity exercise per week. Discussed medication compliance, adverse effects.   3. Localized edema He is on a huge dose of Lasix.  I have provided a 30-day supply since he has run out and have advised him to  follow-up with his nephrologist as his Lasix could also worsen his renal function - furosemide (LASIX) 80 MG tablet; Take 1 tablet (80 mg total) by mouth daily.  Dispense: 60 tablet; Refill: 0  4. S/P BKA (below knee amputation) unilateral, left (HCC) Currently on Lyrica In Office ABI of right foot- 1.27   5.  Insomnia Due to cardiac condition we will hold off on trazodone due to risk of prolonged QT Trial of hydroxyzine which will also help with anxiety. Meds ordered this encounter  Medications   insulin glargine (LANTUS) 100 UNIT/ML Solostar Pen    Sig: Inject 10 Units into the skin at bedtime.    Dispense:  9 mL    Refill:  3  furosemide (LASIX) 80 MG tablet    Sig: Take 1 tablet (80 mg total) by mouth daily.    Dispense:  60 tablet    Refill:  0    Follow-up: Return in about 6 months (around 08/27/2022) for Chronic medical conditions.       [name], MD, FAAFP. [facility] [place], [place]   02/24/2022, 4:43 PM

## 2022-02-25 LAB — MICROALBUMIN / CREATININE URINE RATIO
Creatinine, Urine: 49 mg/dL
Microalb/Creat Ratio: 3432 mg/g creat — ABNORMAL HIGH (ref 0–29)
Microalbumin, Urine: 1681.6 ug/mL

## 2022-03-03 NOTE — Progress Notes (Unsigned)
Patient ID: REEF Mccoy                 DOB: 1970-08-01                      MRN: [id number]      HPI: Todd Mccoy is a 51 y.o. male of Dr. Radford Pax referred by [name], PA-C to HTN clinic. 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 Pt seen by [name] 02/09/22, pt expresses concern over BP, running [id number] at home systolics. Was taking amlodipine 10 mg daily, carvedilol 25 mg BID, imdur 60 mg daily and lasix 80 mg daily. Pt reported working on cutting number of cigarettes as well. BP at visit after recheck was 158/88 and hydralazine 25 mg TID was added. Pt presented to ED 02/21/22 for refills of imdur, lyrica and amlodipine. Pt seen by internal medicine 8/23 and pt reported running out of lasix and was encouraged by provider to f/u with nephrologist as lasix could worsen renal fxn.   Pt presents to HTN clinic today with son. Pt confirms that he is taking amlodipine 10 mg daily, carvedilol 25 mg BID, hydralazine 25 mg TID, and furosemide 80 mg daily. He says that he is no longer out of his medications and he is taking them as prescribed. He denies headache and dizziness. Pt has retinopathy. He says he lives with his niece who helps him with his medications. He tried calling his nephrologist since his hospital admission 7/22 but he did not get an answer. He says his old nephrologist has left and he has a new doctor now. Pt does have upper arm BP cuff at home and takes BP at home about an hour after taking BP medications. He says is BP as 154/101 this morning, yesterday 149/74 but unable to confirm if home BP cuff technique is accurate. He does not add salt to his food but sometimes has sandwiches with cold cuts.   Current HTN meds: amlodipine 10 mg daily, carvedilol 25 mg BID, hydralazine 25 mg TID, furosemide 80 mg daily, imdur 60 mg daily Previously tried: losartan 100 mg daily,  losartan-hctz 100-25 mg daily  BP goal: <130/80   Family History: Mother-DM, father-DM  Social History: quit previously 08/24/21, no alcohol   Diet:  Never adds salt to food  Breakfast - cereal  Lunch- sandwich usually PB&J sometimes with cold cuts  Dinner- baked chicken sometimes home cooked sometimes eats out   Exercise:  Wheelchair bound  Home BP readings:  Is taking bp readings at home 154/101 this morning  Varies at home, yesterday 149/74  Takes about an hour after BP medications   Wt Readings from Last 3 Encounters:  02/09/22 190 lb (86.2 kg)  01/26/22 211 lb 3.2 oz (95.8 kg)  01/09/22 190 lb (86.2 kg)   BP Readings from Last 3 Encounters:  03/04/22 130/82  02/24/22 (!) 188/108  02/21/22 (!) 172/101   Pulse Readings from Last 3 Encounters:  03/04/22 61  02/24/22 70  02/21/22 76    Renal function: CrCl cannot be calculated (Unknown ideal weight.).  Past Medical History:  Diagnosis Date   Anemia    Anemia    Anxiety    Chronic combined systolic (congestive) and diastolic (congestive) heart failure (HCC)    EF 40-45% with G3DD on echo 2022   CKD (chronic kidney disease), stage IV ([place])  12/10/2020   Diabetes mellitus with complication (HCC)    Diabetic neuropathy ([place])    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[place]) 03/25/2020   pt states retinal eye damage- left worse than the right- recent visited MD    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[place]) 03/25/2020   pt states recent MD visit /left eye worse than right   Dyslipidemia 12/10/2020   Hx of BKA, left ([place])    Hypertension    Morbid obesity ([place])    Osteomyelitis ([place])    Pneumonia     Current Outpatient Medications on File Prior to Visit  Medication Sig Dispense Refill   acetaminophen-codeine (TYLENOL #3) 300-30 MG tablet Take 1 tablet by mouth every 12 (twelve) hours as needed for moderate pain. 14 tablet 0   atorvastatin (LIPITOR) 20 MG tablet Take 1 tablet (20 mg total) by mouth daily. 90  tablet 3   carvedilol (COREG) 25 MG tablet Take 1 tablet (25 mg total) by mouth 2 (two) times daily. 180 tablet 3   ferrous sulfate 325 (65 FE) MG tablet Take 1 tablet (325 mg total) by mouth 3 (three) times daily with meals. (Patient taking differently: Take 325 mg by mouth daily with breakfast.) 90 tablet 2   furosemide (LASIX) 80 MG tablet Take 1 tablet (80 mg total) by mouth daily. 60 tablet 0   hydrALAZINE (APRESOLINE) 25 MG tablet Take 1 tablet (25 mg total) by mouth 3 (three) times daily. 270 tablet 3   hydrOXYzine (ATARAX) 25 MG tablet Take 1 tablet (25 mg total) by mouth at bedtime as needed. 30 tablet 1   insulin glargine (LANTUS) 100 UNIT/ML Solostar Pen Inject 10 Units into the skin at bedtime. 9 mL 3   Naphazoline HCl (CLEAR EYES OP) Place 1 drop into both eyes as needed (irritation).     polyethylene glycol powder (GLYCOLAX/MIRALAX) 17 GM/SCOOP powder Take 17 g by mouth 2 (two) times daily as needed. (Patient not taking: Reported on 02/18/2022) 3350 g 1   pregabalin (LYRICA) 50 MG capsule Take 1 capsule (50 mg total) by mouth 2 (two) times daily. 30 capsule 0   [DISCONTINUED] insulin aspart protamine- aspart (NOVOLOG MIX 70/30) (70-30) 100 UNIT/ML injection Inject 0.35 mLs (35 Units total) into the skin 2 (two) times daily. 60 mL 3   [DISCONTINUED] metFORMIN (GLUCOPHAGE-XR) 500 MG 24 hr tablet TAKE 1 TABLET (500 MG TOTAL) BY MOUTH 2 (TWO) TIMES DAILY. 60 tablet 2   No current facility-administered medications on file prior to visit.    No Known Allergies  Blood pressure 130/82, pulse 61, SpO2 97 %.   Assessment/Plan:  1. Hypertension - BP of 130/82 is close to goal of <130/80. It appears that now that patient has been taking blood pressure medications as prescribed that blood pressure is much improved. Informed patient to continue amlodipine 10 mg daily, carvedilol 25 mg BID, hydralazine 25 mg TID, furosemide 80 mg daily. Pt is also taking imdur 60 mg daily. Reminded pt about  proper BP technique at home. Scheduled follow up appointment on 10/4 and instructed pt to bring in blood pressures cuff and readings from home. Encouraged pt to follow up with nephrologist. He will give them a call later today. If needed at next appointment can consider increasing hydralazine to 50 mg TID.   Thank you,  Eliseo Gum, PharmD PGY1 Pharmacy Resident   03/04/2022  11:56 AM

## 2022-03-04 ENCOUNTER — Ambulatory Visit: Payer: Medicaid Other | Attending: Internal Medicine | Admitting: Pharmacist

## 2022-03-04 VITALS — BP 130/82 | HR 61

## 2022-03-04 DIAGNOSIS — I10 Essential (primary) hypertension: Secondary | ICD-10-CM | POA: Diagnosis not present

## 2022-03-04 DIAGNOSIS — N184 Chronic kidney disease, stage 4 (severe): Secondary | ICD-10-CM

## 2022-03-04 DIAGNOSIS — E1122 Type 2 diabetes mellitus with diabetic chronic kidney disease: Secondary | ICD-10-CM

## 2022-03-04 DIAGNOSIS — I129 Hypertensive chronic kidney disease with stage 1 through stage 4 chronic kidney disease, or unspecified chronic kidney disease: Secondary | ICD-10-CM

## 2022-03-04 MED ORDER — ISOSORBIDE MONONITRATE ER 60 MG PO TB24: 60.0000 mg | ORAL_TABLET | Freq: Every day | ORAL | 3 refills | Status: DC

## 2022-03-04 MED ORDER — AMLODIPINE BESYLATE 10 MG PO TABS: 10.0000 mg | ORAL_TABLET | Freq: Every day | ORAL | 3 refills | Status: DC

## 2022-03-04 NOTE — Patient Instructions (Addendum)
Your blood pressure today is 130/82. We would like it to be <130/80.  Continue taking amlodipine 10 mg daily, carvedilol 25 mg BID, hydralazine 25 mg TID, and furosemide 80 mg daily.  Continue taking blood pressure at home about 1 hour after taking blood pressure medications in the morning.   We will see you again on 10/4 at 10:30 am. Remember to bring in your blood pressure cuff and readings.   Make sure to follow up with your kidney doctor.

## 2022-03-05 DIAGNOSIS — Z419 Encounter for procedure for purposes other than remedying health state, unspecified: Secondary | ICD-10-CM | POA: Diagnosis not present

## 2022-03-11 ENCOUNTER — Other Ambulatory Visit: Payer: Self-pay

## 2022-03-11 ENCOUNTER — Encounter (HOSPITAL_COMMUNITY): Payer: Self-pay | Admitting: Emergency Medicine

## 2022-03-11 ENCOUNTER — Emergency Department (HOSPITAL_COMMUNITY): Payer: Medicaid Other

## 2022-03-11 ENCOUNTER — Emergency Department (HOSPITAL_COMMUNITY)
Admission: EM | Admit: 2022-03-11 | Discharge: 2022-03-11 | Disposition: A | Discharge status: Home / Self Care | Payer: Medicaid Other | Attending: Emergency Medicine | Admitting: Emergency Medicine

## 2022-03-11 DIAGNOSIS — M25552 Pain in left hip: Secondary | ICD-10-CM

## 2022-03-11 DIAGNOSIS — N189 Chronic kidney disease, unspecified: Secondary | ICD-10-CM | POA: Insufficient documentation

## 2022-03-11 DIAGNOSIS — E119 Type 2 diabetes mellitus without complications: Secondary | ICD-10-CM | POA: Diagnosis not present

## 2022-03-11 DIAGNOSIS — Z79899 Other long term (current) drug therapy: Secondary | ICD-10-CM | POA: Diagnosis not present

## 2022-03-11 DIAGNOSIS — Z794 Long term (current) use of insulin: Secondary | ICD-10-CM | POA: Diagnosis not present

## 2022-03-11 DIAGNOSIS — Z7984 Long term (current) use of oral hypoglycemic drugs: Secondary | ICD-10-CM | POA: Diagnosis not present

## 2022-03-11 DIAGNOSIS — W06XXXA Fall from bed, initial encounter: Secondary | ICD-10-CM | POA: Insufficient documentation

## 2022-03-11 DIAGNOSIS — S79912A Unspecified injury of left hip, initial encounter: Secondary | ICD-10-CM | POA: Insufficient documentation

## 2022-03-11 DIAGNOSIS — I129 Hypertensive chronic kidney disease with stage 1 through stage 4 chronic kidney disease, or unspecified chronic kidney disease: Secondary | ICD-10-CM | POA: Diagnosis not present

## 2022-03-11 MED ORDER — IBUPROFEN 600 MG PO TABS: 600.0000 mg | ORAL_TABLET | Freq: Four times a day (QID) | ORAL | 0 refills | Status: DC | PRN

## 2022-03-11 NOTE — ED Triage Notes (Signed)
Pt reported to ED with c/o pain to left hip. Pt states he fell yesterday after his leg gave out from under him d/t phantom pain. Has Left BKA.

## 2022-03-11 NOTE — Discharge Instructions (Addendum)
You have been evaluated for your recent fall.  Fortunately x-ray of your left hip did not show any broken bone.  You are likely experiencing a contusion which is a bone bruise.  Please take ibuprofen as needed for pain.  You may follow-up with your doctor for further care.

## 2022-03-11 NOTE — ED Provider Triage Note (Signed)
Emergency Medicine Provider Triage Evaluation Note  Todd Mccoy , a 51 y.o. male  was evaluated in triage.  Pt complains of hip injury. Report he fell and struck his L hip 5 days ago and having pain since.  Report having phantom pain to LLE.  No fever, no numbness, no back pain  Review of Systems  Positive: As above Negative: As above  Physical Exam  BP (!) 146/89 (BP Location: Right Arm)   Pulse 63   Temp 98.2 F (36.8 C) (Oral)   Resp 18   SpO2 97%  Gen:   Awake, no distress   Resp:  Normal effort  MSK:   Moves extremities without difficulty  Other:    Medical Decision Making  Medically screening exam initiated at 1:34 AM.  Appropriate orders placed.  Todd Mccoy was informed that the remainder of the evaluation will be completed by another provider, this initial triage assessment does not replace that evaluation, and the importance of remaining in the ED until their evaluation is complete.     [name], PA-C 03/11/22 0134

## 2022-03-11 NOTE — ED Provider Notes (Signed)
[facility] EMERGENCY DEPARTMENT Provider Note   CSN: [id number] Arrival date & time: 03/11/22  0117     History  Chief Complaint  Patient presents with   Hip Pain    Todd Mccoy is a 51 y.o. male.  The history is provided by the patient and medical records. No language interpreter was used.  Hip Pain     51 year old male who presents for evaluation of left hip injury.  Patient reports 5 days ago, he was experiencing phantom pain to his left lower extremity while he was sitting on the side of the bed which causes him to fall to the ground.  He struck his left hip against the ground.  He did not hit his head or loss of consciousness.  Since then he has had persistent pain to his left hip.  Pain is sharp throbbing and nonradiating.  No numbness.  No back pain.  He takes Tylenol at home with some improvement.  Home Medications Prior to Admission medications   Medication Sig Start Date End Date Taking? Authorizing Provider  acetaminophen-codeine (TYLENOL #3) 300-30 MG tablet Take 1 tablet by mouth every 12 (twelve) hours as needed for moderate pain. 01/19/22   Suzan Slick, NP  amLODipine (NORVASC) 10 MG tablet Take 1 tablet (10 mg total) by mouth daily. 03/04/22   [name], MD  atorvastatin (LIPITOR) 20 MG tablet Take 1 tablet (20 mg total) by mouth daily. 04/23/21   [name], MD  carvedilol (COREG) 25 MG tablet Take 1 tablet (25 mg total) by mouth 2 (two) times daily. 05/21/21   [name], MD  ferrous sulfate 325 (65 FE) MG tablet Take 1 tablet (325 mg total) by mouth 3 (three) times daily with meals. Patient taking differently: Take 325 mg by mouth daily with breakfast. 12/19/20 01/23/22  Deatra James, MD  furosemide (LASIX) 80 MG tablet Take 1 tablet (80 mg total) by mouth daily. 02/24/22   [name], MD  hydrALAZINE (APRESOLINE) 25 MG tablet Take 1 tablet (25 mg total) by mouth 3 (three) times daily. 02/09/22   Elgie Collard,  PA-C  hydrOXYzine (ATARAX) 25 MG tablet Take 1 tablet (25 mg total) by mouth at bedtime as needed. 02/24/22   [name], MD  insulin glargine (LANTUS) 100 UNIT/ML Solostar Pen Inject 10 Units into the skin at bedtime. 02/24/22 02/24/23  [name], MD  isosorbide mononitrate (IMDUR) 60 MG 24 hr tablet Take 1 tablet (60 mg total) by mouth daily. 03/04/22   [name], MD  Naphazoline HCl (CLEAR EYES OP) Place 1 drop into both eyes as needed (irritation).    [provider]  polyethylene glycol powder (GLYCOLAX/MIRALAX) 17 GM/SCOOP powder Take 17 g by mouth 2 (two) times daily as needed. Patient not taking: Reported on 02/18/2022 01/21/21   [name], MD  pregabalin (LYRICA) 50 MG capsule Take 1 capsule (50 mg total) by mouth 2 (two) times daily. 02/21/22   Fransico Meadow, PA-C  insulin aspart protamine- aspart (NOVOLOG MIX 70/30) (70-30) 100 UNIT/ML injection Inject 0.35 mLs (35 Units total) into the skin 2 (two) times daily. 04/17/20 05/13/20  Argentina Donovan, PA-C  metFORMIN (GLUCOPHAGE-XR) 500 MG 24 hr tablet TAKE 1 TABLET (500 MG TOTAL) BY MOUTH 2 (TWO) TIMES DAILY. 12/19/20 12/22/20  Deatra James, MD      Allergies    Patient has no known allergies.    Review of Systems   Review  of Systems  All other systems reviewed and are negative.   Physical Exam Updated Vital Signs BP (!) 146/89 (BP Location: Right Arm)   Pulse 63   Temp 98.2 F (36.8 C) (Oral)   Resp 18   SpO2 97%  Physical Exam Vitals and nursing note reviewed.  Constitutional:      General: He is not in acute distress.    Appearance: He is well-developed.  HENT:     Head: Atraumatic.  Eyes:     Conjunctiva/sclera: Conjunctivae normal.  Abdominal:     Palpations: Abdomen is soft.     Tenderness: There is no abdominal tenderness.  Musculoskeletal:        General: Tenderness (Left hip: Tenderness noted to the lateral hip on palpation without any bruising or crepitus noted.  Able to  perform range of motion about the hip.  No lumbar midline spine tenderness.) present.     Cervical back: Neck supple.     Comments: Left BKA.  Skin:    Findings: No rash.  Neurological:     Mental Status: He is alert.     ED Results / Procedures / Treatments   Labs (all labs ordered are listed, but only abnormal results are displayed) Labs Reviewed - No data to display  EKG None  Radiology DG Hip Unilat W or Wo Pelvis 2-3 Views Left  Result Date: 03/11/2022 CLINICAL DATA:  Fall, hip pain EXAM: DG HIP (WITH OR WITHOUT PELVIS) 2-3V LEFT COMPARISON:  None Available. FINDINGS: There is no evidence of hip fracture or dislocation. There is no evidence of arthropathy or other focal bone abnormality. IMPRESSION: Negative. Electronically Signed   By: Merilyn Baba M.D.   On: 03/11/2022 02:08    Procedures Procedures    Medications Ordered in ED Medications - No data to display  ED Course/ Medical Decision Making/ A&P                           Medical Decision Making Amount and/or Complexity of Data Reviewed Radiology: ordered.   BP (!) 146/89 (BP Location: Right Arm)   Pulse 63   Temp 98.2 F (36.8 C) (Oral)   Resp 18   SpO2 97%   2:15 AM This is a 51 year old male with significant history of diabetes, left BKA, hypertension, CKD, presenting for evaluation of left hip injury.  Patient reports several days prior he was having phantom pain to his left lower extremity while he was sitting on the bed and he fell down the ground.  He struck his left hip against the ground.  He did not hit his head or loss of consciousness.  He is now complaining of recurrent pain about the left hip.  Pain is sharp throbbing nonradiating worse with palpation.  He uses a wheelchair to move about.  He does not endorse any numbness or back pain.  On exam patient has tenderness about the left lateral hip without any bruising noted.  Range of motion is intact.  No lumbar midline spine tenderness  noted.  X-ray of the left hip and pelvis was obtained independently viewed and interpreted by me and I agree with radiologist interpretation.  Fortunately x-ray did not show any acute fracture or dislocation.  I discussed this finding with patient, recommend rice therapy.  Patient otherwise stable to be discharged.        Final Clinical Impression(s) / ED Diagnoses Final diagnoses:  Left hip pain  Rx / DC Orders ED Discharge Orders          Ordered    ibuprofen (ADVIL) 600 MG tablet  Every 6 hours PRN        03/11/22 0224              [name], PA-C 03/11/22 0225    Jeanell Sparrow, DO 03/11/22 [phone]

## 2022-03-12 ENCOUNTER — Ambulatory Visit: Payer: Medicaid Other | Admitting: Cardiology

## 2022-03-17 ENCOUNTER — Other Ambulatory Visit: Payer: Self-pay | Admitting: Family Medicine

## 2022-03-17 NOTE — Telephone Encounter (Signed)
Copied from [place] [id number]. Topic: General - Other >> Mar 17, 2022  1:52 PM Everette C wrote: Reason for CRM: Medication Refill - Medication: pregabalin (LYRICA) 50 MG capsule [[id number]]   Has the patient contacted their pharmacy? Yes.  The patient has been directed to contact their PCP (Agent: If no, request that the patient contact the pharmacy for the refill. If patient does not wish to contact the pharmacy document the reason why and proceed with request.) (Agent: If yes, when and what did the pharmacy advise?)  Preferred Pharmacy (with phone number or street name): [place] ([place]), [place] - [address] [place] ([place]  [address] Phone: [phone] Fax: [phone] Hours: Not open 24 hours   Has the patient been seen for an appointment in the last year OR does the patient have an upcoming appointment? Yes.    Agent: Please be advised that RX refills may take up to 3 business days. We ask that you follow-up with your pharmacy.   The patient shares that they have called previously and stressed the urgency of their request

## 2022-03-18 MED ORDER — PREGABALIN 50 MG PO CAPS
50.0000 mg | ORAL_CAPSULE | Freq: Two times a day (BID) | ORAL | 3 refills | Status: DC
Start: 2022-03-18 — End: 2022-06-14

## 2022-03-18 NOTE — Telephone Encounter (Signed)
Requested medication (s) are due for refill today: yes  Requested medication (s) are on the active medication list: yes  Last refill:  02/21/22  Future visit scheduled: yes  Notes to clinic:  Unable to refill per protocol, cannot delegate.  Last refill was from another provider, routing for approval.     Requested Prescriptions  Pending Prescriptions Disp Refills   pregabalin (LYRICA) 50 MG capsule 30 capsule 0    Sig: Take 1 capsule (50 mg total) by mouth 2 (two) times daily.     Not Delegated - Neurology:  Anticonvulsants - Controlled - pregabalin Failed - 03/17/2022  2:29 PM      Failed - This refill cannot be delegated      Failed - Cr in normal range and within 360 days    Creatinine, Ser  Date Value Ref Range Status  02/21/2022 4.18 (H) 0.61 - 1.24 mg/dL Final   Creatinine, Urine  Date Value Ref Range Status  02/27/2021 131.25 mg/dL Final         Passed - Completed PHQ-2 or PHQ-9 in the last 360 days      Passed - Valid encounter within last 12 months    Recent Outpatient Visits           3 weeks ago 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 complication, unspecified whether long term insulin use ([place])   [place], Charlane Ferretti, MD   12 months ago Other insomnia   [place], [place], MD   1 year ago 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 complication, unspecified whether long term insulin use ([place])   [place], [place], MD   1 year ago 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 complication, unspecified whether long term insulin use ([place])   [place], [place], MD   1 year ago 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 complication, unspecified whether long term insulin use ([place])   [place], [name], MD       Future Appointments             In 3 weeks  [place], [facility]   In 1 month [place], Terance Hart, PA-C [facility] [place]. [facility], [facility]   In 5 months [name], MD [place]

## 2022-03-22 ENCOUNTER — Other Ambulatory Visit: Payer: Self-pay | Admitting: *Deleted

## 2022-03-22 NOTE — Patient Outreach (Signed)
Medicaid Managed Care   Nurse Care Manager Note  03/22/2022 Name:  Todd Mccoy MRN:  [id number] DOB:  17-Jun-1971  Todd Mccoy is an 51 y.o. year old male who is a primary patient of [name], MD.  The [facility] Managed Care Coordination team was consulted for assistance with:    HTN DMII  Mr. Vos was given information about Medicaid Managed Care Coordination team services today. [name] Patient agreed to services and verbal consent obtained.  Engaged with patient by telephone for follow up visit in response to provider referral for case management and/or care coordination services.   Assessments/Interventions:  Review of past medical history, allergies, medications, health status, including review of consultants reports, laboratory and other test data, was performed as part of comprehensive evaluation and provision of chronic care management services.  SDOH (Social Determinants of Health) assessments and interventions performed: SDOH Interventions    Flowsheet Row Patient Outreach Telephone from 03/22/2022 in [place] Patient Outreach Telephone from 09/30/2021 in [place] from 02/19/2021 in [place]  SDOH Interventions     Food Insecurity Interventions -- Intervention Not Indicated --  Housing Interventions Intervention Not Indicated Intervention Not Indicated Intervention Not Indicated  Transportation Interventions Intervention Not Indicated Intervention Not Indicated Intervention Not Indicated  Financial Strain Interventions -- Intervention Not Indicated --  Physical Activity Interventions -- Intervention Not Indicated --  Stress Interventions -- Intervention Not Indicated --  Social Connections Interventions -- Intervention Not Indicated --       Care Plan  No Known  Allergies  Medications Reviewed Today     Reviewed by Melissa Montane, RN (Registered Nurse) on 03/22/22 at [place] List Status: <None>   Medication Order Taking? Sig Documenting Provider Last Dose Status Informant  acetaminophen-codeine (TYLENOL #3) 300-30 MG tablet [id number] No Take 1 tablet by mouth every 12 (twelve) hours as needed for moderate pain. Suzan Slick, NP Taking Active Self  amLODipine (NORVASC) 10 MG tablet [id number]  Take 1 tablet (10 mg total) by mouth daily. [name], MD  Active   atorvastatin (LIPITOR) 20 MG tablet [id number] No Take 1 tablet (20 mg total) by mouth daily. [name], MD Taking Active Self  carvedilol (COREG) 25 MG tablet [id number] No Take 1 tablet (25 mg total) by mouth 2 (two) times daily. [name], MD Taking Active Self  ferrous sulfate 325 (65 FE) MG tablet [id number] No Take 1 tablet (325 mg total) by mouth 3 (three) times daily with meals.  Patient taking differently: Take 325 mg by mouth daily with breakfast.   Deatra James, MD 01/22/2022 Expired 01/23/22 2359 Self  furosemide (LASIX) 80 MG tablet [id number]  Take 1 tablet (80 mg total) by mouth daily. [name], MD  Active   hydrALAZINE (APRESOLINE) 25 MG tablet [id number] No Take 1 tablet (25 mg total) by mouth 3 (three) times daily. Elgie Collard, PA-C Taking Active   hydrOXYzine (ATARAX) 25 MG tablet [id number]  Take 1 tablet (25 mg total) by mouth at bedtime as needed. [name], MD  Active   ibuprofen (ADVIL) 600 MG tablet [id number]  Take 1 tablet (600 mg total) by mouth every 6 (six) hours as needed. [name], PA-C  Active     Discontinued 05/13/20 1120   insulin glargine (LANTUS) 100 UNIT/ML Solostar Pen [id number]  Inject  10 Units into the skin at bedtime. [name], MD  Active   isosorbide mononitrate (IMDUR) 60 MG 24 hr tablet [id number]  Take 1 tablet (60 mg total) by mouth daily. [name], MD  Active   Discontinued 12/22/20 1205 (Stop  Taking at Discharge)   Naphazoline HCl (CLEAR EYES OP) [id number] No Place 1 drop into both eyes as needed (irritation). [provider] Taking Active Self  polyethylene glycol powder (GLYCOLAX/MIRALAX) 17 GM/SCOOP powder [id number] No Take 17 g by mouth 2 (two) times daily as needed.  Patient not taking: Reported on 02/18/2022   [name], MD Not Taking Active Self  pregabalin (LYRICA) 50 MG capsule [id number]  Take 1 capsule (50 mg total) by mouth 2 (two) times daily. [name], MD  Active             Patient Active Problem List   Diagnosis Date Noted   Renal insufficiency    Acute on chronic systolic CHF (congestive heart failure) ([place]) 01/23/2022   Prolonged QT interval 01/23/2022   Chest pain 01/23/2022   Elevated troponin 01/23/2022   Anemia due to chronic kidney disease 01/23/2022   Hypertensive emergency 01/23/2022   Hyponatremia 01/23/2022   Chronic combined systolic (congestive) and diastolic (congestive) heart failure ([place]) 04/07/2021   Chronic renal disease, stage 4, severely decreased glomerular filtration rate between 15-29 mL/min/1.73 square meter (HCC)    Hypertensive urgency    Acute kidney injury superimposed on chronic kidney disease ([place])    Acute CHF (congestive heart failure) ([place]) 02/27/2021   Normocytic anemia 02/27/2021   Anasarca 02/27/2021   Abscess of left foot 12/10/2020   S/P BKA (below knee amputation) unilateral, left ([place]) 12/10/2020   Dyslipidemia 12/10/2020   CKD (chronic kidney disease), stage III ([place]) 12/10/2020   HTN (hypertension) 11/12/2020   Cellulitis and abscess of foot    Diabetes mellitus with hyperglycemia ([place]) 09/07/2014   Tobacco abuse 09/07/2014   Abscess of back    Abscess of lower back 09/06/2014   DM2 (diabetes mellitus, type 2) ([place]) 09/06/2014   Sepsis ([place]) 09/06/2014   Scrotal abscess 06/20/2014    Conditions to be addressed/monitored per PCP order:  HTN and DMII  Care Plan : RN Care Manager Plan  of Care  Updates made by Melissa Montane, RN since 03/22/2022 12:00 AM     Problem: Chronic Disease Management and Care Coordination Needs for CHF, HTN, DM   Priority: High     Long-Range Goal: Development of Plan of Care for Chronic Disease Management and Care Coordination Needs (CHF, HTN, DM)   Start Date: 09/30/2021  Expected End Date: 05/04/2022  Priority: High  Note:   Current Barriers:  Knowledge Deficits related to plan of care for management of CHF, HTN, and DMII  Chronic Disease Management support and education needs related to CHF, HTN, and DMII Difficulty obtaining medications Recent Vision 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  RNCM Clinical Goal(s):  Patient will verbalize understanding of plan for management of CHF, HTN, and DMII as evidenced by improvement in management of these chronic diseases. verbalize basic understanding of CHF, HTN, and DMII disease process and self health management plan as evidenced by improved control of factors that cause CHF, improved blood pressure readings < 140/90 and improved control of blood sugar readings with range with 80 - 120. take all medications exactly as prescribed and will call provider for medication related questions as evidenced by compliance  with medications3/31/    attend all scheduled medical appointments: No future appointments scheduled as evidenced by attending all scheduled appointments        demonstrate improved adherence to prescribed treatment plan for CHF, HTN, and DMII as evidenced by better control of blood pressure, blood sugars and factors impacting CHF. continue to work with Consulting civil engineer and/or Social Worker to address care management and care coordination needs related to CHF, HTN, and DMII as evidenced by adherence to CM Team Scheduled appointments     through collaboration with RN Care manager, provider, and care team.   Interventions: Inter-disciplinary care team  collaboration (see longitudinal plan of care) Evaluation of current treatment plan related to  self management and patient's adherence to plan as established by provider Assisted with contacting [place] [phone] for compliance packaging and delivery, requested medications to be transferred, reviewed current medication list with Pharmacist   Heart Failure Interventions:  (Status: Goal on Track (progressing): YES.)  Long Term Goal  Wt Readings from Last 3 Encounters:  02/09/22 190 lb (86.2 kg)  01/26/22 211 lb 3.2 oz (95.8 kg)  01/09/22 190 lb (86.2 kg)    Discussed importance of daily weight and advised patient to weigh and record daily Reviewed role of diuretics in prevention of fluid overload and management of heart failure Discussed the importance of keeping all appointments with provider Assessed social determinant of health barriers Reviewed medications with patient's niece, advised to call for any needed refills prior to running out Advised patient to take all medications and BP readings to all upcoming appointments Reviewed upcoming appointments including 04/08/22 with Pharmacist and 05/12/22 with Cardiology   Diabetes:  (Status: Goal on Track (progressing): YES.) Long Term Goal  Lab Results  Component Value Date   HGBA1C 6.9 02/24/2022    Assessed patient's understanding of A1c goal: <7% Reviewed medications with patient and discussed importance of medication adherence;        Discussed plans with patient for ongoing care management follow up and provided patient with direct contact information for care management team;      Reviewed scheduled/upcoming provider appointments including: 04/08/22 with Pharmacist and 05/12/22 with Cardiology;         Advised patient, providing education and rationale, to check cbg once daily and record        call provider for findings outside established parameters;          Hypertension: (Status: Goal on Track (progressing): YES.) Long  Term Goal Patient is taking all medications, cutting back on salt and checking BP daily Last practice recorded BP readings: at home-patient unable to recall last home reading BP Readings from Last 3 Encounters:  03/11/22 (!) 146/89  03/04/22 130/82  02/24/22 (!) 188/108  Most recent eGFR/CrCl:  Lab Results  Component Value Date   EGFR 31 (L) 05/21/2021    No components found for: "CRCL"  Evaluation of current treatment plan related to hypertension self management and patient's adherence to plan as established by provider;   Reviewed prescribed diet managing HTN Reviewed medications with patient and discussed importance of compliance;  Advised patient, providing education and rationale, to monitor blood pressure daily and record, calling PCP for findings outside established parameters;  Reviewed scheduled/upcoming provider appointments including: 04/08/22 with Cardiology Pharmacist and 05/12/22 with Cardiology Advised patient to take BP readings and medications to upcoming provider appointments  Patient Goals/Self-Care Activities: Take medications as prescribed   Attend all scheduled provider appointments Call pharmacy for medication refills 3-7  days in advance of running out of medications Call provider office for new concerns or questions  check blood sugar at prescribed times: once daily check blood pressure daily limit salt intake to 2000 mg/day       Follow Up:  Patient agrees to Care Plan and Follow-up.  Plan: The Managed Medicaid care management team will reach out to the patient again over the next 30 days.  Date/time of next scheduled RN care management/care coordination outreach:  04/22/22 @ 11:15am  Lurena Joiner RN, [name] [place]  [employer] manager

## 2022-03-22 NOTE — Patient Instructions (Signed)
Visit Information  Todd Mccoy was given information about Medicaid Managed Care team care coordination services as a part of their [facility] Medicaid benefit. Todd Mccoy verbally consented to engagement with the [facility] Managed Care team.   If you are experiencing a medical emergency, please call 911 or report to your local emergency department or urgent care.   If you have a non-emergency medical problem during routine business hours, please contact your provider's office and ask to speak with a nurse.   For questions related to your [facility] health plan, please call: [phone] or go here:[email or web address]  If you would like to schedule transportation through your [facility] plan, please call the following number at least 2 days in advance of your appointment: [phone].  You can also use the MTM portal or MTM mobile app to manage your rides. For the portal, please go to [email or web address].  Call the [facility] at [phone], at any time, 24 hours a day, 7 days a week. If you are in danger or need immediate medical attention call 911.  If you would like help to quit smoking, call 1-800-QUIT-NOW 651 688 5493) OR Espaol: 1-855-Djelo-Ya (0-156-153-7943) o para ms informacin haga clic aqu or Text READY to 200-400 to register via text  Todd Mccoy,   Please see education materials related to hypertension provided as print materials.   The patient verbalized understanding of instructions, educational materials, and care plan provided today and agreed to receive a mailed copy of patient instructions, educational materials, and care plan.   Telephone follow up appointment with Managed Medicaid care management team member scheduled for:04/22/22 @ 11:15am  Lurena Joiner RN, [name] [place] RN Care Coordinator   Following is a copy of your plan of care:  Care Plan : RN Care Manager Plan of Care   Updates made by Melissa Montane, RN since 03/22/2022 12:00 AM     Problem: Chronic Disease Management and Care Coordination Needs for CHF, HTN, DM   Priority: High     Long-Range Goal: Development of Plan of Care for Chronic Disease Management and Care Coordination Needs (CHF, HTN, DM)   Start Date: 09/30/2021  Expected End Date: 05/04/2022  Priority: High  Note:   Current Barriers:  Knowledge Deficits related to plan of care for management of CHF, HTN, and DMII  Chronic Disease Management support and education needs related to CHF, HTN, and DMII Difficulty obtaining medications Recent Vision 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  RNCM Clinical Goal(s):  Patient will verbalize understanding of plan for management of CHF, HTN, and DMII as evidenced by improvement in management of these chronic diseases. verbalize basic understanding of CHF, HTN, and DMII disease process and self health management plan as evidenced by improved control of factors that cause CHF, improved blood pressure readings < 140/90 and improved control of blood sugar readings with range with 80 - 120. take all medications exactly as prescribed and will call provider for medication related questions as evidenced by compliance with medications3/31/    attend all scheduled medical appointments: No future appointments scheduled as evidenced by attending all scheduled appointments        demonstrate improved adherence to prescribed treatment plan for CHF, HTN, and DMII as evidenced by better control of blood pressure, blood sugars and factors impacting CHF. continue to work with Consulting civil engineer and/or Social Worker to address care management and care coordination needs related to  CHF, HTN, and DMII as evidenced by adherence to CM Team Scheduled appointments     through collaboration with RN Care manager, provider, and care team.   Interventions: Inter-disciplinary care team  collaboration (see longitudinal plan of care) Evaluation of current treatment plan related to  self management and patient's adherence to plan as established by provider Assisted with contacting [place] [phone] for compliance packaging and delivery, requested medications to be transferred, reviewed current medication list with Pharmacist   Heart Failure Interventions:  (Status: Goal on Track (progressing): YES.)  Long Term Goal  Wt Readings from Last 3 Encounters:  02/09/22 190 lb (86.2 kg)  01/26/22 211 lb 3.2 oz (95.8 kg)  01/09/22 190 lb (86.2 kg)    Discussed importance of daily weight and advised patient to weigh and record daily Reviewed role of diuretics in prevention of fluid overload and management of heart failure Discussed the importance of keeping all appointments with provider Assessed social determinant of health barriers Reviewed medications with patient's niece, advised to call for any needed refills prior to running out Advised patient to take all medications and BP readings to all upcoming appointments Reviewed upcoming appointments including 04/08/22 with Pharmacist and 05/12/22 with Cardiology   Diabetes:  (Status: Goal on Track (progressing): YES.) Long Term Goal  Lab Results  Component Value Date   HGBA1C 6.9 02/24/2022    Assessed patient's understanding of A1c goal: <7% Reviewed medications with patient and discussed importance of medication adherence;        Discussed plans with patient for ongoing care management follow up and provided patient with direct contact information for care management team;      Reviewed scheduled/upcoming provider appointments including: 04/08/22 with Pharmacist and 05/12/22 with Cardiology;         Advised patient, providing education and rationale, to check cbg once daily and record        call provider for findings outside established parameters;          Hypertension: (Status: Goal on Track (progressing): YES.) Long  Term Goal Patient is taking all medications, cutting back on salt and checking BP daily Last practice recorded BP readings: at home-patient unable to recall last home reading BP Readings from Last 3 Encounters:  03/11/22 (!) 146/89  03/04/22 130/82  02/24/22 (!) 188/108  Most recent eGFR/CrCl:  Lab Results  Component Value Date   EGFR 31 (L) 05/21/2021    No components found for: "CRCL"  Evaluation of current treatment plan related to hypertension self management and patient's adherence to plan as established by provider;   Reviewed prescribed diet managing HTN Reviewed medications with patient and discussed importance of compliance;  Advised patient, providing education and rationale, to monitor blood pressure daily and record, calling PCP for findings outside established parameters;  Reviewed scheduled/upcoming provider appointments including: 04/08/22 with Cardiology Pharmacist and 05/12/22 with Cardiology Advised patient to take BP readings and medications to upcoming provider appointments  Patient Goals/Self-Care Activities: Take medications as prescribed   Attend all scheduled provider appointments Call pharmacy for medication refills 3-7 days in advance of running out of medications Call provider office for new concerns or questions  check blood sugar at prescribed times: once daily check blood pressure daily limit salt intake to 2000 mg/day

## 2022-03-25 DIAGNOSIS — T85398D Other mechanical complication of other ocular prosthetic devices, implants and grafts, subsequent encounter: Secondary | ICD-10-CM | POA: Diagnosis not present

## 2022-03-25 DIAGNOSIS — E113523 Type 2 diabetes mellitus with proliferative diabetic retinopathy with traction retinal detachment involving the macula, bilateral: Secondary | ICD-10-CM | POA: Diagnosis not present

## 2022-03-25 DIAGNOSIS — H3582 Retinal ischemia: Secondary | ICD-10-CM | POA: Diagnosis not present

## 2022-03-25 DIAGNOSIS — H35341 Macular cyst, hole, or pseudohole, right eye: Secondary | ICD-10-CM | POA: Diagnosis not present

## 2022-04-04 DIAGNOSIS — Z419 Encounter for procedure for purposes other than remedying health state, unspecified: Secondary | ICD-10-CM | POA: Diagnosis not present

## 2022-04-05 ENCOUNTER — Telehealth: Payer: Self-pay | Admitting: *Deleted

## 2022-04-05 DIAGNOSIS — H25811 Combined forms of age-related cataract, right eye: Secondary | ICD-10-CM | POA: Diagnosis not present

## 2022-04-05 NOTE — Telephone Encounter (Signed)
   Patient Name: Todd Mccoy  DOB: 02/24/1971 MRN: [id number]  Primary Cardiologist: Fransico Him, MD  Chart reviewed as part of pre-operative protocol coverage. Cataract extractions are recognized in guidelines as low risk surgeries that do not typically require specific preoperative testing or holding of blood thinner therapy. Therefore, given past medical history and time since last visit, based on ACC/AHA guidelines, Todd Mccoy would be at acceptable risk for the planned procedure without further cardiovascular testing.   I will route this recommendation to the requesting party via Epic fax function and remove from pre-op pool.  Please call with questions.  Emmaline Life, NP-C  04/05/2022, 4:09 PM [address], [address] Office [phone] Fax [phone]

## 2022-04-05 NOTE — Telephone Encounter (Signed)
   Pre-operative Risk Assessment    Patient Name: LOGEN HEINTZELMAN  DOB: 1971-01-08 MRN: [id number]      Request for Surgical Clearance    Procedure:   CATARACT EXTRACTION BY PE, IOL-RIGHT  Date of Surgery:  Clearance 04/22/22                                 Surgeon:  DR. Marca Ancona. [facility]CE[place] Surgeon's Group or Practice Name:  [facility] Phone number:  [phone]  EXT: [phone] Fax number:  [phone]   Type of Clearance Requested:   - Medical    Type of Anesthesia:   IV SEDATON   Additional requests/questions:    Signed, Jeanann Lewandowsky   04/05/2022, 2:30 PM

## 2022-04-08 ENCOUNTER — Ambulatory Visit: Payer: Medicaid Other | Attending: Cardiovascular Disease | Admitting: Student

## 2022-04-08 VITALS — BP 130/80 | HR 60

## 2022-04-08 DIAGNOSIS — I10 Essential (primary) hypertension: Secondary | ICD-10-CM | POA: Diagnosis not present

## 2022-04-08 MED ORDER — OMRON 3 SERIES BP MONITOR DEVI: 1.0000 | Freq: Every day | 0 refills | Status: DC

## 2022-04-08 NOTE — Assessment & Plan Note (Addendum)
Assessment:   BP 131/80 in office today: slightly above the goal (<130/80)  Patient denies Dizziness, lightheadedness, headache, blurred vision, SOB, swelling   Patient reports he takes his medications regularly but forgets some days   Started smoking again but motivated to cut down on number of cigarettes  Home BP cuff was validated today and it was not accurate  Plan:   Not making any medications changes as in office BP was very close to the goal. Continue current medications amlodipine 10 mg daily, carvedilol 25 mg BID, hydralazine 25 mg TID,furosemide 80 mg daily, Imdur 60 mg daily   Will re-assess BP in 5 weeks   New BP monitor prescription provided as it would be covered under medicaid   Patient to cut down on number of cigarettes from 10 to 6 in 5 weeks

## 2022-04-08 NOTE — Patient Instructions (Addendum)
Goal: Reduce number of cigarette from 10 to 4 in next 4 weeks. Not making any medication changes this time as in office BP was 131/80  Continue taking  amlodipine 10 mg daily,  carvedilol 25 mg twice daily,  hydralazine 25 mg three times ,  furosemide 80 mg daily, Imdur 60 mg daily Get new BP monitor and bing it to the next office visit for validation  Your blood pressure goal is <130/80  To check your pressure at home you will need to:  1. Sit up in a chair, with feet flat on the floor and back supported. Do not cross your ankles or legs. 2. Rest your left arm so that the cuff is about heart level. If the cuff goes on your upper arm,  then just relax the arm on the table, arm of the chair or your lap. If you have a wrist cuff, we  suggest relaxing your wrist against your chest (think of it as Pledging the Flag with the  wrong arm).  3. Place the cuff snugly around your arm, about 1 inch above the crook of your elbow. The  cords should be inside the groove of your elbow.  4. Sit quietly, with the cuff in place, for about 5 minutes. After that 5 minutes press the power  button to start a reading. 5. Do not talk or move while the reading is taking place.  6. Record your readings on a sheet of paper. Although most cuffs have a memory, it is often  easier to see a pattern developing when the numbers are all in front of you.  7. You can repeat the reading after 1-3 minutes if it is recommended  Make sure your bladder is empty and you have not had caffeine or tobacco within the last 30 min  Always bring your blood pressure log with you to your appointments. If you have not brought your monitor in to be double checked for accuracy, please bring it to your next appointment.  You can find a list of validated (accurate) blood pressure cuffs at validatebp.org

## 2022-04-08 NOTE — Progress Notes (Signed)
Patient ID: Todd Mccoy                 DOB: 03/16/1971                      MRN: [id number]      HPI: Todd Mccoy is a 51 y.o. male Todd Mccoy's patient referred by Todd Rough, PA-C to HTN clinic. 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 Pt seen by Todd Mccoy 02/09/22, pt expresses concern over BP, running [id number]-[id number] at home systolics. Was taking amlodipine 10 mg daily, carvedilol 25 mg BID, imdur 60 mg daily and lasix 80 mg daily. Pt reported working on cutting number of cigarettes as well. BP at visit after recheck was 158/88 and hydralazine 25 mg TID was added. Pt presented to ED 02/21/22 for refills of imdur, lyrica and amlodipine. Pt seen by internal medicine 8/23 and pt reported running out of lasix and was encouraged by provider to f/u with nephrologist as lasix could worsen renal fxn. patient's BP at last Pharm D visit (8/31) was just slightly above the goal (130/82). Did not make any mediations changes and encouraged patient to be adherent to the medications.   Patient states he is feeling well overall. He fell and hurt his left hip last month but now feeling better. Pain is manageable with OTC tylenol. Try to take medications regularly but forgets sometime (e.g. missed taking pills 1-2 days in past week). Will start getting all medications in pill pack to improve adherence. He denies dizziness, lightheadedness, headache, blurred vision, SOB, swelling. He has started smoking again but lot less than he use to (full pack previously - half pack now). Patient seems motivated to cut down on number of cigarette but not ready to quit yet! Patient brought in home BP monitor for validation, the reading was way off compared to the office readings and patient has medicaid so will provide prescription for new BP monitor.    Home cuff validation   Office readings  132/80  Office readings  130/80   Home monitor reading 152/97  Home monitor reading  156/93    Current HTN meds:  amlodipine 10 mg daily,  carvedilol 25 mg BID,  hydralazine 25 mg TID,  furosemide 80 mg daily, Imdur 60 mg daily Previously tried: losartan 100 mg daily, losartan-hctz 100-25 mg daily  BP goal: <130/80    Family History: Mother-DM, father-DM  Social History: quit previously 08/24/21, no alcohol    Exercise:  Wheelchair bound   Home BP readings:  This morning 140/110   Wt Readings from Last 3 Encounters:  02/09/22 190 lb (86.2 kg)  01/26/22 211 lb 3.2 oz (95.8 kg)  01/09/22 190 lb (86.2 kg)   BP Readings from Last 3 Encounters:  04/08/22 130/80  03/11/22 (!) 146/89  03/04/22 130/82   Pulse Readings from Last 3 Encounters:  04/08/22 60  03/11/22 63  03/04/22 61    Renal function: CrCl cannot be calculated (Patient's most recent lab result is older than the maximum 21 days allowed.).  Past Medical History:  Diagnosis Date   Anemia    Anemia    Anxiety    Chronic combined systolic (congestive) and diastolic (congestive) heart failure (HCC)    EF 40-45% with G3DD on echo 2022   CKD (chronic kidney disease), stage IV ([place]) 12/10/2020   Diabetes mellitus with complication (  [place])    Diabetic neuropathy ([place])    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[place]) 03/25/2020   pt states retinal eye damage- left worse than the right- recent visited MD    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[place]) 03/25/2020   pt states recent MD visit /left eye worse than right   Dyslipidemia 12/10/2020   Hx of BKA, left ([place])    Hypertension    Morbid obesity ([place])    Osteomyelitis ([place])    Pneumonia     Current Outpatient Medications on File Prior to Visit  Medication Sig Dispense Refill   acetaminophen-codeine (TYLENOL #3) 300-30 MG tablet Take 1 tablet by mouth every 12 (twelve) hours as needed for moderate pain. 14 tablet 0   amLODipine (NORVASC) 10 MG tablet Take 1 tablet (10 mg total) by mouth  daily. 30 tablet 3   atorvastatin (LIPITOR) 20 MG tablet Take 1 tablet (20 mg total) by mouth daily. 90 tablet 3   carvedilol (COREG) 25 MG tablet Take 1 tablet (25 mg total) by mouth 2 (two) times daily. 180 tablet 3   ferrous sulfate 325 (65 FE) MG tablet Take 1 tablet (325 mg total) by mouth 3 (three) times daily with meals. (Patient taking differently: Take 325 mg by mouth daily with breakfast.) 90 tablet 2   furosemide (LASIX) 80 MG tablet Take 1 tablet (80 mg total) by mouth daily. 60 tablet 0   hydrALAZINE (APRESOLINE) 25 MG tablet Take 1 tablet (25 mg total) by mouth 3 (three) times daily. 270 tablet 3   hydrOXYzine (ATARAX) 25 MG tablet Take 1 tablet (25 mg total) by mouth at bedtime as needed. 30 tablet 1   ibuprofen (ADVIL) 600 MG tablet Take 1 tablet (600 mg total) by mouth every 6 (six) hours as needed. 30 tablet 0   insulin glargine (LANTUS) 100 UNIT/ML Solostar Pen Inject 10 Units into the skin at bedtime. 9 mL 3   isosorbide mononitrate (IMDUR) 60 MG 24 hr tablet Take 1 tablet (60 mg total) by mouth daily. 30 tablet 3   Naphazoline HCl (CLEAR EYES OP) Place 1 drop into both eyes as needed (irritation).     polyethylene glycol powder (GLYCOLAX/MIRALAX) 17 GM/SCOOP powder Take 17 g by mouth 2 (two) times daily as needed. (Patient not taking: Reported on 02/18/2022) 3350 g 1   pregabalin (LYRICA) 50 MG capsule Take 1 capsule (50 mg total) by mouth 2 (two) times daily. 30 capsule 3   [DISCONTINUED] insulin aspart protamine- aspart (NOVOLOG MIX 70/30) (70-30) 100 UNIT/ML injection Inject 0.35 mLs (35 Units total) into the skin 2 (two) times daily. 60 mL 3   [DISCONTINUED] metFORMIN (GLUCOPHAGE-XR) 500 MG 24 hr tablet TAKE 1 TABLET (500 MG TOTAL) BY MOUTH 2 (TWO) TIMES DAILY. 60 tablet 2   No current facility-administered medications on file prior to visit.    No Known Allergies  Blood pressure 130/80, pulse 60.   HTN (hypertension) Assessment:  BP 131/80 in office today: slightly  above the goal (<130/80) Patient denies Dizziness, lightheadedness, headache, blurred vision, SOB, swelling  Patient reports he takes his medications regularly but forgets some days  Started smoking again but motivated to cut down on number of cigarettes Home BP cuff was validated today and it was not accurate  Plan:  Not making any medications changes as in office BP was very close to the goal. Continue current medications amlodipine 10 mg daily, carvedilol 25 mg BID, hydralazine 25 mg TID,furosemide 80 mg daily,  Imdur 60 mg daily  Will re-assess BP in 5 weeks  New BP monitor prescription provided as it would be covered under medicaid  Patient to cut down on number of cigarettes from 10 to 6 in 5 weeks    Thank you  Cammy Copa, Pharm.D [facility] [address] [place], [place] [address]  Phone: [phone]; Fax: [phone]

## 2022-04-22 ENCOUNTER — Ambulatory Visit: Payer: Self-pay

## 2022-04-22 DIAGNOSIS — H25811 Combined forms of age-related cataract, right eye: Secondary | ICD-10-CM | POA: Diagnosis not present

## 2022-04-22 DIAGNOSIS — H269 Unspecified cataract: Secondary | ICD-10-CM | POA: Diagnosis not present

## 2022-04-22 DIAGNOSIS — I10 Essential (primary) hypertension: Secondary | ICD-10-CM | POA: Diagnosis not present

## 2022-04-23 IMAGING — CR DG FOOT COMPLETE 3+V*R*
3 series · 3 of 3 positions shown · non-contrast
Comparison: None.

CLINICAL DATA: Nonhealing right foot wound.

EXAM:
RIGHT FOOT COMPLETE - 3+ VIEW

[x foot ap right]
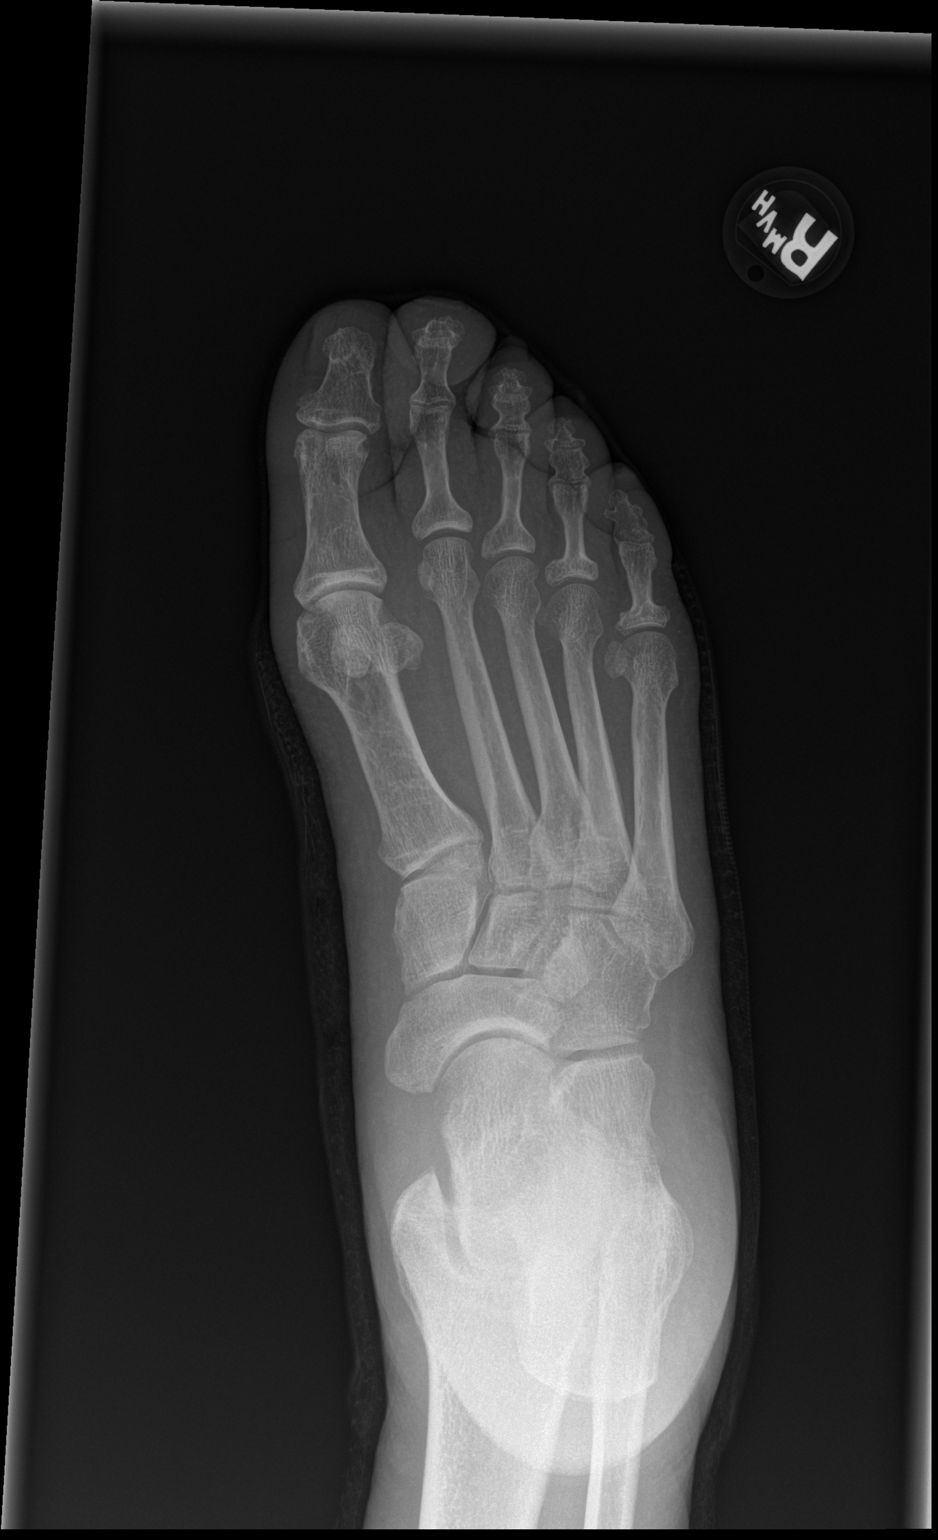

[x foot obl right]
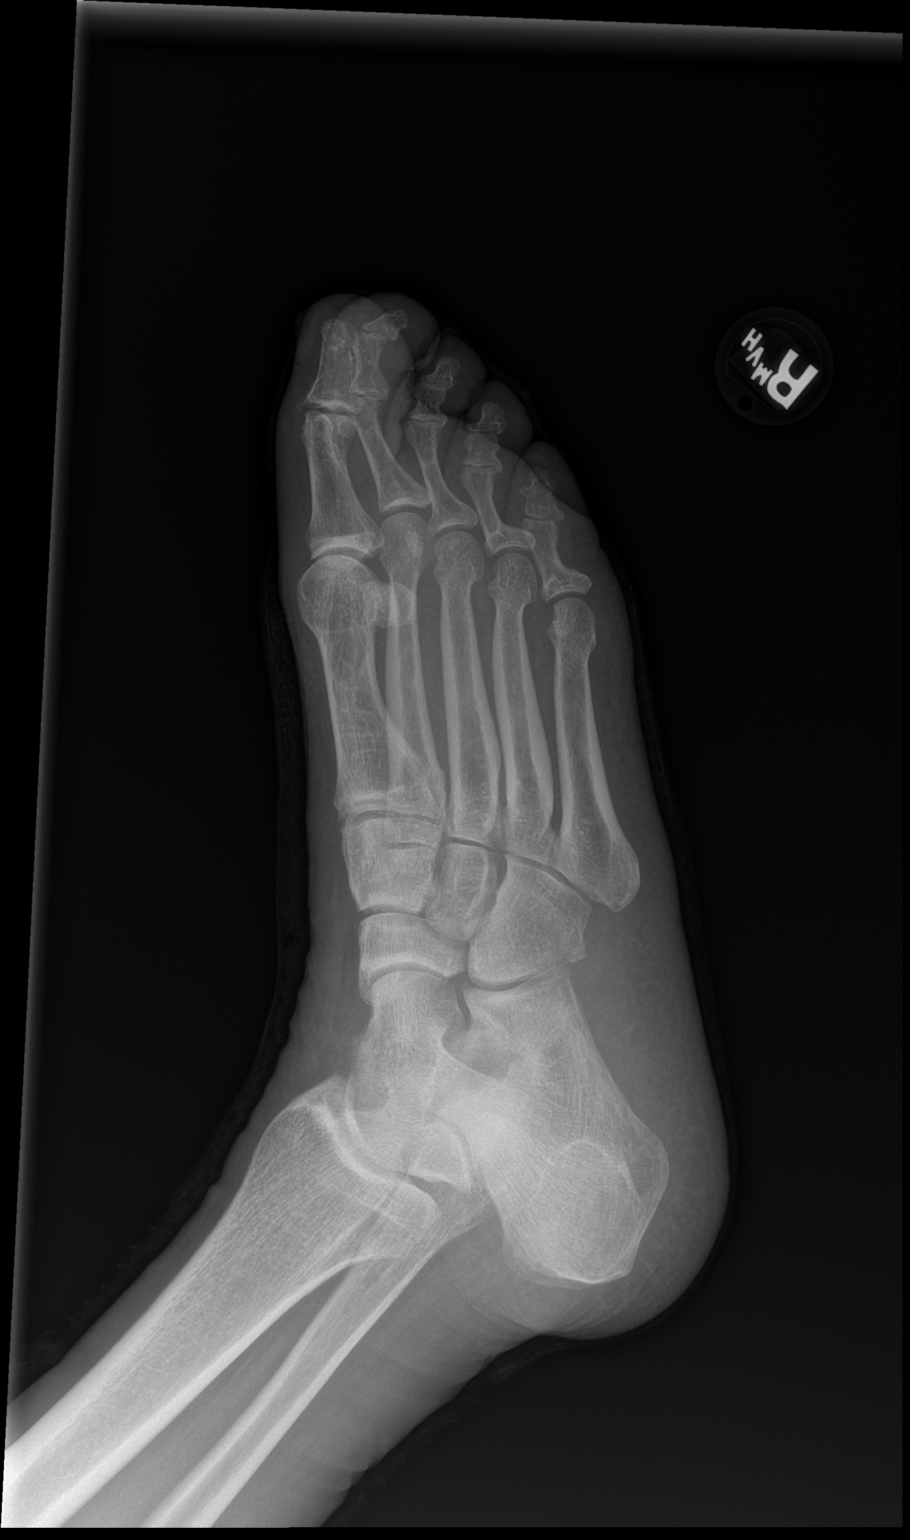

[x foot lat right]
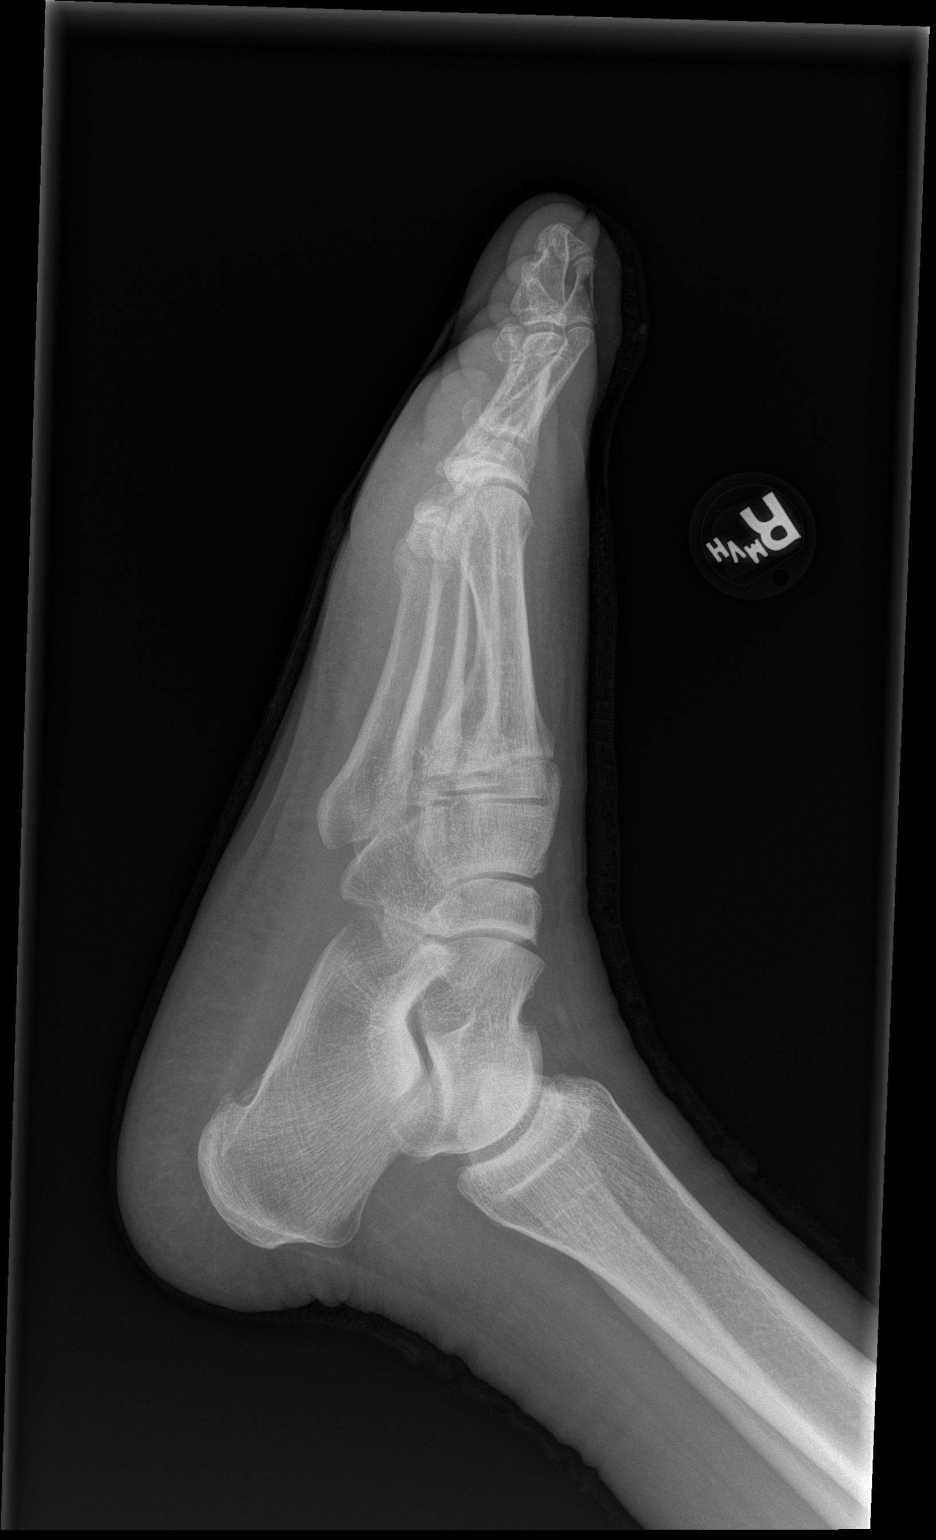

[3 of 3 positions shown; findings below may reference images not displayed]

FINDINGS: There is no evidence of fracture or dislocation. There is no
evidence of arthropathy or other focal bone abnormality. Soft
tissues are unremarkable.
IMPRESSION: Negative.

## 2022-04-27 ENCOUNTER — Encounter (HOSPITAL_COMMUNITY): Payer: Medicaid Other | Admitting: Cardiology

## 2022-04-27 NOTE — Progress Notes (Incomplete)
ADVANCED HEART FAILURE CLINIC NOTE  Referring Physician: [name], MD  Primary Care: [name], MD Primary Cardiologist:Dr. Radford Pax  HPI: Todd Mccoy is a 51 y.o. 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  [name] was admitted to [facility] in July 2023 after presenting with left-sided chest pain and worsening shortness of breat. TTE from admission w/ LVEF 35%-40%, severe LVH, mild RVH with apical sparing on strain. Diuresis and management of cardiomyopathy were hindered by a sCr as high as 4.3 (up from baseline of 2.5-3.5). Since that time he has had a PYP scan and myeloma panel. He has also visited the [facility] emergency department twice. Once more medication refills and a second time for left hip pain.   Interval hx:    Activity level/exercise tolerance:  *** Orthopnea:  Sleeps on *** pillows Paroxysmal noctural dyspnea:  *** Chest pain/pressure:  *** Orthostatic lightheadedness:  *** Palpitations:  *** Lower extremity edema:  *** Presyncope/syncope:  *** Cough:  ***  Past Medical History:  Diagnosis Date   Anemia    Anemia    Anxiety    Chronic combined systolic (congestive) and diastolic (congestive) heart failure (HCC)    EF 40-45% with G3DD on echo 2022   CKD (chronic kidney disease), stage IV ([place]) 12/10/2020   Diabetes mellitus with complication ([place])    Diabetic neuropathy ([place])    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[place]) 03/25/2020   pt states retinal eye damage- left worse than the right- recent visited MD    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[place]) 03/25/2020   pt states recent MD visit /left eye worse than right   Dyslipidemia 12/10/2020   Hx of BKA, left (HCC)    Hypertension    Morbid obesity ([place])    Osteomyelitis (HCC)    Pneumonia      Current Outpatient Medications  Medication Sig Dispense Refill   acetaminophen-codeine (TYLENOL #3) 300-30 MG tablet Take 1 tablet by mouth every 12 (twelve) hours as needed for moderate pain. 14 tablet 0   amLODipine (NORVASC) 10 MG tablet Take 1 tablet (10 mg total) by mouth daily. 30 tablet 3   atorvastatin (LIPITOR) 20 MG tablet Take 1 tablet (20 mg total) by mouth daily. 90 tablet 3   Blood Pressure Monitoring (OMRON 3 SERIES BP MONITOR) DEVI 1 each by Does not apply route daily. 1 each 0   carvedilol (COREG) 25 MG tablet Take 1 tablet (25 mg total) by mouth 2 (two) times daily. 180 tablet 3   ferrous sulfate 325 (65 FE) MG tablet Take 1 tablet (325 mg total) by mouth 3 (three) times daily with meals. (Patient taking differently: Take 325 mg by mouth daily with breakfast.) 90 tablet 2   furosemide (LASIX) 80 MG tablet Take 1 tablet (80 mg total) by mouth daily. 60 tablet 0   hydrALAZINE (APRESOLINE) 25 MG tablet Take 1 tablet (25 mg total) by mouth 3 (three) times daily. 270 tablet 3   hydrOXYzine (ATARAX) 25 MG tablet Take 1 tablet (25 mg total) by mouth at bedtime as needed. 30 tablet 1   ibuprofen (ADVIL) 600 MG tablet Take 1 tablet (600 mg total) by mouth every 6 (six) hours as needed. 30 tablet 0   insulin glargine (LANTUS) 100 UNIT/ML Solostar Pen Inject 10 Units into the  skin at bedtime. 9 mL 3   isosorbide mononitrate (IMDUR) 60 MG 24 hr tablet Take 1 tablet (60 mg total) by mouth daily. 30 tablet 3   Naphazoline HCl (CLEAR EYES OP) Place 1 drop into both eyes as needed (irritation).     polyethylene glycol powder (GLYCOLAX/MIRALAX) 17 GM/SCOOP powder Take 17 g by mouth 2 (two) times daily as needed. (Patient not taking: Reported on 02/18/2022) 3350 g 1   pregabalin (LYRICA) 50 MG capsule Take 1 capsule (50 mg total) by mouth 2 (two) times daily. 30 capsule 3   No current facility-administered medications for this visit.    No Known Allergies    Social History    Socioeconomic History   Marital status: Single    Spouse name: Not on file   Number of children: 1   Years of education: Not on file   Highest education level: Not on file  Occupational History   Not on file  Tobacco Use   Smoking status: Some Days    Packs/day: 1.00    Years: 32.00    Total pack years: 32.00    Types: Cigarettes    Last attempt to quit: 08/24/2021    Years since quitting: 0.6    Passive exposure: Past   Smokeless tobacco: Former   Tobacco comments:    Quit smoking previously February 2023  Vaping Use   Vaping Use: Never used  Substance and Sexual Activity   Alcohol use: No   Drug use: No   Sexual activity: Not on file  Other Topics Concern   Not on file  Social History Narrative   Not on file   Social Determinants of Health   Financial Resource Strain: Low Risk  (09/30/2021)   Overall Financial Resource Strain (CARDIA)    Difficulty of Paying Living Expenses: Not hard at all  Food Insecurity: No Food Insecurity (09/30/2021)   Hunger Vital Sign    Worried About Running Out of Food in the Last Year: Never true    [place] in the Last Year: Never true  Transportation Needs: No Transportation Needs (03/22/2022)   PRAPARE - Hydrologist (Medical): No    Lack of Transportation (Non-Medical): No  Physical Activity: Inactive (09/30/2021)   Exercise Vital Sign    Days of Exercise per Week: 0 days    Minutes of Exercise per Session: 0 min  Stress: No Stress Concern Present (09/30/2021)   [place]    Feeling of Stress : Only a little  Social Connections: Moderately Isolated (09/30/2021)   Social Connection and Isolation Panel [NHANES]    Frequency of Communication with Friends and Family: More than three times a week    Frequency of Social Gatherings with Friends and Family: More than three times a week    Attends Religious Services: 1 to 4 times per year     Active Member of [employer] or Organizations: No    Attends Archivist Meetings: Never    Marital Status: Never married  Human resources officer Violence: Not on file      Family History  Problem Relation Age of Onset   Diabetes Mother    Diabetes Father    Diabetes Brother    Colon cancer Neg Hx    Esophageal cancer Neg Hx    Pancreatic cancer Neg Hx    Stomach cancer Neg Hx    Liver disease Neg Hx  CAD Neg Hx     PHYSICAL EXAM: There were no vitals filed for this visit. GENERAL: Well nourished, well developed, and in no apparent distress at rest.  HEENT: Negative for arcus senilis or xanthelasma. There is no scleral icterus.  The mucous membranes are pink and moist.   NECK: Supple, No masses. Normal carotid upstrokes without bruits. No masses or thyromegaly.    CHEST: There are no chest wall deformities. There is no chest wall tenderness. Respirations are unlabored.  Lungs- *** CARDIAC:  JVP: *** cm H2O         {HEART SOUNDS:22645}  Normal rate with regular rhythm. No murmurs, rubs or gallops.  Pulses are 2+ and symmetrical in upper and lower extremities. *** edema.  ABDOMEN: Soft, non-tender, non-distended. There are no masses or hepatomegaly. There are normal bowel sounds.  EXTREMITIES: Warm and well perfused with no cyanosis, clubbing.  LYMPHATIC: No axillary or supraclavicular lymphadenopathy.  NEUROLOGIC: Patient is oriented x3 with no focal or lateralizing neurologic deficits.  PSYCH: Patients affect is appropriate, there is no evidence of anxiety or depression.  SKIN: Warm and dry; no lesions or wounds.   DATA REVIEW  ECG:***  ECHO: 01/23/22: LVEF 35%-40%, severe LVH; mildly reduced RV function; apical sparing on strain.  CATH:***   ASSESSMENT & PLAN:  Heart failure with reduced ejection fraction Etiology of HF:*** NYHA class / AHA Stage:*** Volume status & Diuretics: *** Vasodilators:*** Beta-Blocker:*** MRA:*** Cardiometabolic:*** Devices  therapies & Valvulopathies:*** Advanced therapies:***  Follow-up:  No follow-ups on file.   Seger Jani Advanced Heart Failure Mechanical Circulatory Support

## 2022-05-04 ENCOUNTER — Other Ambulatory Visit: Payer: Self-pay | Admitting: *Deleted

## 2022-05-04 NOTE — Patient Outreach (Signed)
  Medicaid Managed Care   Unsuccessful Attempt Note   05/04/2022 Name: Todd Mccoy MRN: [id number] DOB: 1970/12/21  Referred by: [name], MD Reason for referral : High Risk Managed Medicaid (Unsuccessful RNCM follow up telephone outreach)   An unsuccessful telephone outreach was attempted today. The patient was referred to the case management team for assistance with care management and care coordination.    Follow Up Plan: A HIPAA compliant phone message was left for the patient providing contact information and requesting a return call. and The Managed Medicaid care management team will reach out to the patient again over the next 14 days.    Lurena Joiner RN, [name] [place]  [employer] manager

## 2022-05-04 NOTE — Patient Instructions (Signed)
Visit Information  [name]  - as a part of your Medicaid benefit, you are eligible for care management and care coordination services at no cost or copay. I was unable to reach you by phone today but would be happy to help you with your health related needs. Please feel free to call me @ [phone].   A member of the Managed Medicaid care management team will reach out to you again over the next 14 days.   Lurena Joiner RN, [name] [place]  [employer] manager

## 2022-05-05 DIAGNOSIS — Z419 Encounter for procedure for purposes other than remedying health state, unspecified: Secondary | ICD-10-CM | POA: Diagnosis not present

## 2022-05-10 NOTE — Progress Notes (Unsigned)
Office Visit    Patient Name: Todd Mccoy Date of Encounter: 05/10/2022  PCP:  [name], [place]  Cardiologist:  Fransico Him, MD  Advanced Practice Provider:  No care team member to display Electrophysiologist:  None   Chief Complaint    [name] is a 51 y.o. 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  The patient uses a wheelchair and is legally blind.  Most recent LVEF 40% on 02/2021.  He was admitted to the ED 02/27/2021 with anasarca secondary to acute combined systolic/diastolic CHF were treated with IV diuretics and metolazone.  His diuretics were managed by nephrology due to diabetic nephropathy with secondary nephrotic syndrome.  He was seen in follow-up by pharmacy 05/2021 for up titration of Norvasc.  He was also referred for sleep study to rule out OSA.  Most recent GFR on 9/2 was 28.  At the time of his last visit his blood pressure was running about 150/80.  He was tolerating his medications well.  He was taking Imdur 30 mg daily although his PCP increased his dose 2 months prior.  He was prescribed 80 mg of Lasix 3 times daily but was only taking it once a day since he had not noticed any swelling.  He was not taking any potassium supplementation.  He had mild swelling in his legs with addition of amlodipine.  He was on carvedilol 25 mg twice a day.  He was working on cutting his smoking down and keeping an eye on his diet.  At his last appointment his amlodipine was increased to 10 mg daily and he was to continue his hydralazine 100 mg 3 times a day.  A refill was sent on his Imdur 60 mg daily (which was the dose he was supposed to be taking).  He was seen by me 02/09/22 and he  stated that he is worried about his blood pressure.  Usually it has been running 170s to 161W at home systolic.  Sometimes he takes his blood pressure before his medicines and sometimes he takes it after.  He is currently on amlodipine 10 mg daily and carvedilol 25 mg twice daily.  He also takes Imdur 60 mg daily.  He is working on cutting down on his cigarettes and only smokes a few a day at this point.  It seems there has been some confusion with his medications in the past we will set him up with one of our Pharm.Ds.  Today, his blood pressure is 170/98.  On repeat it was 158/80.  I do think that he needs a little bit more medicine to get him better controlled.  He also sees a nephrologist for his kidneys and creatinine is 4.4 which limits our choices for medical therapy.  He was recently in the hospital for fluid overload and was diuresed.  He remains on 80 mg of Lasix daily which seems to be controlling his fluid at this point.  Recommend daily weights.  Seen by pharmacy 03/04/2022 and 04/08/2022 and during both appointments blood pressure was at goal.  Today, he ***  Past Medical History    Past Medical History:  Diagnosis Date   Anemia    Anemia    Anxiety    Chronic combined systolic (  congestive) and diastolic (congestive) heart failure (HCC)    EF 40-45% with G3DD on echo 2022   CKD (chronic kidney disease), stage IV ([place]) 12/10/2020   Diabetes mellitus with complication ([place])    Diabetic neuropathy ([place])    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[place]) 03/25/2020   pt states retinal eye damage- left worse than the right- recent visited MD    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[place]) 03/25/2020   pt states recent MD visit /left eye worse than right   Dyslipidemia 12/10/2020   Hx of BKA, left ([place])    Hypertension    Morbid obesity ([place])    Osteomyelitis ([place])    Pneumonia    Past Surgical History:  Procedure Laterality Date   ABSCESS DRAINAGE     neck   AMPUTATION Left 11/14/2020    Procedure: LEFT 5TH RAY AMPUTATION;  Surgeon: Newt Minion, MD;  Location: [place] [facility];  Service: Orthopedics;  Laterality: Left;   AMPUTATION Left 12/10/2020   Procedure: LEFT BELOW KNEE AMPUTATION;  Surgeon: Newt Minion, MD;  Location: [facility];  Service: Orthopedics;  Laterality: Left;   AMPUTATION TOE Right 03/25/2020   Procedure: AMPUTATION TOE;  Surgeon: Evelina Bucy, DPM;  Location: [facility];  Service: Podiatry;  Laterality: Right;   INCISION AND DRAINAGE ABSCESS Right 09/07/2014   Procedure: INCISION AND DRAINAGE ABSCESS RIGHT FLANK;  Surgeon: Jackolyn Confer, MD;  Location: [facility];  Service: General;  Laterality: Right;   LEG AMPUTATION BELOW KNEE Left 12/10/2020   SCROTUM EXPLORATION      Allergies  No Known Allergies   EKGs/Labs/Other Studies Reviewed:   The following studies were reviewed today:  Echocardiogram 01/23/22 IMPRESSIONS     1. Left ventricular ejection fraction by 3D volume is 38 %. The left  ventricle has moderate to severely decreased function. The left ventricle  demonstrates global hypokinesis. There is severe left ventricular  hypertrophy. Left ventricular diastolic  parameters are consistent with Grade III diastolic dysfunction  (restrictive). The average left ventricular global longitudinal strain is  -8.8 %. The global longitudinal strain is abnormal. Strain pattern  consistent with apical sparing, consider an  evaluation for cardiac amyloidosis if clinically relevant.   2. Right ventricular systolic function is moderately reduced. The right  ventricular size is mildly enlarged. Moderately increased right  ventricular wall thickness. There is normal pulmonary artery systolic  pressure. The estimated right ventricular  systolic pressure is 42.3 mmHg.   3. Left atrial size was severely dilated.   4. A small pericardial effusion is present. There is no evidence of  cardiac tamponade.   5. The mitral valve is grossly normal. Mild mitral valve  regurgitation.  No evidence of mitral stenosis.   6. The aortic valve is tricuspid. There is mild thickening of the aortic  valve. Aortic valve regurgitation is not visualized. No aortic stenosis is  present.   7. The inferior vena cava is normal in size with greater than 50%  respiratory variability, suggesting right atrial pressure of 3 mmHg.   EKG:  EKG is not ordered today.   Recent Labs: 05/21/2021: ALT 18 01/25/2022: Magnesium 1.6 02/21/2022: B Natriuretic Peptide 4,003.4; BUN 53; Creatinine, Ser 4.18; Hemoglobin 9.5; Platelets 164; Potassium 4.4; Sodium 137  Recent Lipid Panel    Component Value Date/Time   CHOL 82 (L) 05/21/2021 1057   TRIG 67 05/21/2021 1057   HDL 27 (L) 05/21/2021 1057   CHOLHDL 3.0 05/21/2021 1057   LDLCALC 40  05/21/2021 1057   LDLDIRECT 36 05/21/2021 1057     Home Medications   No outpatient medications have been marked as taking for the 05/12/22 encounter (Appointment) with Elgie Collard, PA-C.     Review of Systems      All other systems reviewed and are otherwise negative except as noted above.  Physical Exam    VS:  There were no vitals taken for this visit. , BMI There is no height or weight on file to calculate BMI.  Wt Readings from Last 3 Encounters:  02/09/22 190 lb (86.2 kg)  01/26/22 211 lb 3.2 oz (95.8 kg)  01/09/22 190 lb (86.2 kg)     GEN: Well nourished, well developed, in no acute distress. HEENT: normal. Neck: Supple, no JVD, carotid bruits, or masses. Cardiac: RRR, no murmurs, rubs, or gallops. No clubbing, cyanosis, 1-2 pitting lower ext edema.  Thready pulses, some edema in left arm (chronic)  Respiratory:  Respirations regular and unlabored, clear to auscultation bilaterally. GI: Soft, nontender, nondistended. MS: No deformity or atrophy. Skin: Warm and dry, no rash. Neuro:  Strength and sensation are intact. Psych: Normal affect.  Assessment & Plan    CHF/HTN -no dialysis but follows with nephrology,  creatinine 4.4 -Only taking Amlodipine 10mg  daily and Coreg 25mg   -Taking Imdur 60 mg daily  -continue hydralazine 25mg  TID -Asked him to please take his BP once a day an hour after morning medications for 2 weeks and send me those values via MyChart or call -Seen by Pharm.D. 04/08/2022 and blood pressures well controlled at that appointment.  No medication changes were made.  New prescription provided for blood pressure cuff. -He was taking Pressur-Lo supplements over the counter, encouraged to discontinue because could interfere with BP medications and drop his BP too low.   Tobacco abuse -few cigarettes a day, trying to cut down   HLD -Lipid panel, LDL 36  -Repeat at PCP in a few weeks and please send [place] those labs  4. Lower Ext Edema -Recently in the hospital for fluid overload -Continue Lasix 80 mg daily  -Seems to be back down to his baseline today -CBC and BMP with primary   No BP recorded.  {Refresh Note OR Click here to enter BP  :1}***      Disposition: Follow up *** with Fransico Him, MD or APP.  Signed, Elgie Collard, PA-C 05/10/2022, 12:04 PM  [facility]

## 2022-05-12 ENCOUNTER — Ambulatory Visit: Payer: Medicaid Other | Admitting: Physician Assistant

## 2022-05-12 ENCOUNTER — Other Ambulatory Visit: Payer: Medicaid Other | Admitting: *Deleted

## 2022-05-12 ENCOUNTER — Encounter: Payer: Self-pay | Admitting: *Deleted

## 2022-05-12 DIAGNOSIS — R600 Localized edema: Secondary | ICD-10-CM

## 2022-05-12 DIAGNOSIS — I10 Essential (primary) hypertension: Secondary | ICD-10-CM

## 2022-05-12 DIAGNOSIS — I5043 Acute on chronic combined systolic (congestive) and diastolic (congestive) heart failure: Secondary | ICD-10-CM

## 2022-05-12 DIAGNOSIS — Z720 Tobacco use: Secondary | ICD-10-CM

## 2022-05-12 DIAGNOSIS — E785 Hyperlipidemia, unspecified: Secondary | ICD-10-CM

## 2022-05-12 NOTE — Patient Instructions (Signed)
Visit Information  Todd Mccoy was given information about Medicaid Managed Care team care coordination services as a part of their [facility] Medicaid benefit. Todd Mccoy verbally consented to engagement with the [facility] Managed Care team.   If you are experiencing a medical emergency, please call 911 or report to your local emergency department or urgent care.   If you have a non-emergency medical problem during routine business hours, please contact your provider's office and ask to speak with a nurse.   For questions related to your [facility] health plan, please call: [phone] or go here:[email or web address]  If you would like to schedule transportation through your [facility] plan, please call the following number at least 2 days in advance of your appointment: [phone].  You can also use the MTM portal or MTM mobile app to manage your rides. For the portal, please go to [email or web address].  Call the [place] at [phone], at any time, 24 hours a day, 7 days a week. If you are in danger or need immediate medical attention call 911.  If you would like help to quit smoking, call 1-800-QUIT-NOW (530)352-8249) OR Espaol: 1-855-Djelo-Ya (6-967-893-8101) o para ms informacin haga clic aqu or Text READY to 200-400 to register via text  Todd Mccoy,   Please see education materials related to HF provided as print materials.   The patient verbalized understanding of instructions, educational materials, and care plan provided today and agreed to receive a mailed copy of patient instructions, educational materials, and care plan.   Telephone follow up appointment with Managed Medicaid care management team member scheduled for:06/14/22 @ [place] RN, [name] [place] RN Care Coordinator   Following is a copy of your plan of care:  Care Plan : RN Care Manager Plan of Care  Updates made by  Melissa Montane, RN since 05/12/2022 12:00 AM     Problem: Chronic Disease Management and Care Coordination Needs for CHF, HTN, DM   Priority: High     Long-Range Goal: Development of Plan of Care for Chronic Disease Management and Care Coordination Needs (CHF, HTN, DM)   Start Date: 09/30/2021  Expected End Date: 07/02/2022  Priority: High  Note:   Current Barriers:  Knowledge Deficits related to plan of care for management of CHF, HTN, and DMII  Chronic Disease Management support and education needs related to CHF, HTN, and DMII Difficulty obtaining medications Recent Vision 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  RNCM Clinical Goal(s):  Patient will verbalize understanding of plan for management of CHF, HTN, and DMII as evidenced by improvement in management of these chronic diseases. verbalize basic understanding of CHF, HTN, and DMII disease process and self health management plan as evidenced by improved control of factors that cause CHF, improved blood pressure readings < 140/90 and improved control of blood sugar readings with range with 80 - 120. take all medications exactly as prescribed and will call provider for medication related questions as evidenced by compliance with medications3/31/    attend all scheduled medical appointments: No future appointments scheduled as evidenced by attending all scheduled appointments        demonstrate improved adherence to prescribed treatment plan for CHF, HTN, and DMII as evidenced by better control of blood pressure, blood sugars and factors impacting CHF. continue to work with Consulting civil engineer and/or Social Worker to address care management and care coordination needs related to  CHF, HTN, and DMII as evidenced by adherence to CM Team Scheduled appointments     through collaboration with RN Care manager, provider, and care team.   Interventions: Inter-disciplinary care team collaboration (see  longitudinal plan of care) Evaluation of current treatment plan related to  self management and patient's adherence to plan as established by provider Advised patient to request compliance packaging when requesting medication refill   Heart Failure Interventions:  (Status: Goal on Track (progressing): YES.)  Long Term Goal  Wt Readings from Last 3 Encounters:  02/09/22 190 lb (86.2 kg)  01/26/22 211 lb 3.2 oz (95.8 kg)  01/09/22 190 lb (86.2 kg)    Discussed importance of daily weight and advised patient to weigh and record daily Reviewed role of diuretics in prevention of fluid overload and management of heart failure Discussed the importance of keeping all appointments with provider Assessed social determinant of health barriers Reviewed medications, now receiving medications from [facility], advised to request compliance packaging Advised patient to take all medications and BP readings to all upcoming appointments Reviewed upcoming appointments including 05/18/22 with Pharmacist and Cardiology Collaborated with HF Clinic to assist with rescheduling missed appointment Discussed the importance of daily weights, advised to contact provider if weight gain of 2-3 pounds over night or 5 pounds in a week   Diabetes:  (Status: Goal Met.) Long Term Goal  Lab Results  Component Value Date   HGBA1C 6.9 02/24/2022    Assessed patient's understanding of A1c goal: <7% Reviewed medications with patient and discussed importance of medication adherence;        Discussed plans with patient for ongoing care management follow up and provided patient with direct contact information for care management team;      Reviewed scheduled/upcoming provider appointments including: 04/08/22 with Pharmacist and 05/12/22 with Cardiology;         Advised patient, providing education and rationale, to check cbg once daily and record        call provider for findings outside established parameters;           Hypertension: (Status: Goal on Track (progressing): YES.) Long Term Goal Patient is taking all medications, cutting back on salt and checking BP daily Last practice recorded BP readings: at home-patient unable to recall last home reading BP Readings from Last 3 Encounters:  04/08/22 130/80  03/11/22 (!) 146/89  03/04/22 130/82  Most recent eGFR/CrCl:  Lab Results  Component Value Date   EGFR 31 (L) 05/21/2021    No components found for: "CRCL"  Reviewed medications with patient and discussed importance of compliance;  Advised patient, providing education and rationale, to monitor blood pressure daily and record, calling PCP for findings outside established parameters;  Reviewed scheduled/upcoming provider appointments including: 05/18/22 with Cardiology Pharmacist and Cardiology Reviewed recent provider notes, verified patient obtained new BP monitor  Patient Goals/Self-Care Activities: Take medications as prescribed   Attend all scheduled provider appointments Call pharmacy for medication refills 3-7 days in advance of running out of medications Call provider office for new concerns or questions  check blood sugar at prescribed times: once daily check blood pressure daily limit salt intake to 2000 mg/day

## 2022-05-12 NOTE — Patient Outreach (Signed)
Medicaid Managed Care   Nurse Care Manager Note  05/12/2022 Name:  Todd Mccoy MRN:  [id number] DOB:  11/23/1970  Todd Mccoy is an 51 y.o. year old male who is a primary patient of Todd Rakes, MD.  The [facility] Managed Care Coordination team was consulted for assistance with:    CHF HTN  Todd Mccoy was given information about Medicaid Managed Care Coordination team services today. Todd Mccoy Patient agreed to services and verbal consent obtained.  Engaged with patient by telephone for follow up visit in response to provider referral for case management and/or care coordination services.   Assessments/Interventions:  Review of past medical history, allergies, medications, health status, including review of consultants reports, laboratory and other test data, was performed as part of comprehensive evaluation and provision of chronic care management services.  SDOH (Social Determinants of Health) assessments and interventions performed: SDOH Interventions    Flowsheet Row Patient Outreach Telephone from 03/22/2022 in [place] Patient Outreach Telephone from 09/30/2021 in [place] from 02/19/2021 in [place]  SDOH Interventions     Food Insecurity Interventions -- Intervention Not Indicated --  Housing Interventions Intervention Not Indicated Intervention Not Indicated Intervention Not Indicated  Transportation Interventions Intervention Not Indicated Intervention Not Indicated Intervention Not Indicated  Financial Strain Interventions -- Intervention Not Indicated --  Physical Activity Interventions -- Intervention Not Indicated --  Stress Interventions -- Intervention Not Indicated --  Social Connections Interventions -- Intervention Not Indicated --       Care Plan  No Known  Allergies  Medications Reviewed Today     Reviewed by Todd Montane, RN (Registered Nurse) on 05/12/22 at [place] List Status: <None>   Medication Order Taking? Sig Documenting Provider Last Dose Status Informant  acetaminophen-codeine (TYLENOL #3) 300-30 MG tablet [id number] No Take 1 tablet by mouth every 12 (twelve) hours as needed for moderate pain.  Patient not taking: Reported on 05/12/2022   Todd Slick, NP Not Taking Active Self  amLODipine (NORVASC) 10 MG tablet [id number] Yes Take 1 tablet (10 mg total) by mouth daily. Todd Margarita, MD Taking Active   atorvastatin (LIPITOR) 20 MG tablet [id number] Yes Take 1 tablet (20 mg total) by mouth daily. Todd Margarita, MD Taking Active Self  Blood Pressure Monitoring (OMRON 3 SERIES BP MONITOR) DEVI [id number] Yes 1 each by Does not apply route daily. Todd Margarita, MD Taking Active   carvedilol (COREG) 25 MG tablet [id number] Yes Take 1 tablet (25 mg total) by mouth 2 (two) times daily. Todd Margarita, MD Taking Active Self  ferrous sulfate 325 (65 FE) MG tablet [id number] Yes Take 1 tablet (325 mg total) by mouth 3 (three) times daily with meals.  Patient taking differently: Take 325 mg by mouth daily with breakfast.   Todd James, MD Taking Active Self  furosemide (LASIX) 80 MG tablet [id number] Yes Take 1 tablet (80 mg total) by mouth daily. Todd Rakes, MD Taking Active   hydrALAZINE (APRESOLINE) 25 MG tablet [id number] Yes Take 1 tablet (25 mg total) by mouth 3 (three) times daily. Todd Collard, PA-C Taking Active   hydrOXYzine (ATARAX) 25 MG tablet [id number] Yes Take 1 tablet (25 mg total) by mouth at bedtime as needed. Todd Rakes, MD Taking Active   ibuprofen (ADVIL) 600 MG tablet [id number] Yes Take 1 tablet (  600 mg total) by mouth every 6 (six) hours as needed. Todd Moras, PA-C Taking Active     Discontinued 05/13/20 1120   insulin glargine (LANTUS) 100 UNIT/ML Solostar Pen [id number] Yes Inject 10 Units into  the skin at bedtime. Todd Rakes, MD Taking Active   isosorbide mononitrate (IMDUR) 60 MG 24 hr tablet [id number] Yes Take 1 tablet (60 mg total) by mouth daily. Todd Margarita, MD Taking Active     Discontinued 12/22/20 1205 (Stop Taking at Discharge)   Naphazoline HCl (CLEAR EYES OP) [id number] Yes Place 1 drop into both eyes as needed (irritation). [provider] Taking Active Self  polyethylene glycol powder (GLYCOLAX/MIRALAX) 17 GM/SCOOP powder [id number] No Take 17 g by mouth 2 (two) times daily as needed.  Patient not taking: Reported on 02/18/2022   Todd Rakes, MD Not Taking Active Self  pregabalin (LYRICA) 50 MG capsule [id number] Yes Take 1 capsule (50 mg total) by mouth 2 (two) times daily. Todd Rakes, MD Taking Active             Patient Active Problem List   Diagnosis Date Noted   Renal insufficiency    Acute on chronic systolic CHF (congestive heart failure) ([place]) 01/23/2022   Prolonged QT interval 01/23/2022   Chest pain 01/23/2022   Elevated troponin 01/23/2022   Anemia due to chronic kidney disease 01/23/2022   Hypertensive emergency 01/23/2022   Hyponatremia 01/23/2022   Chronic combined systolic (congestive) and diastolic (congestive) heart failure ([place]) 04/07/2021   Chronic renal disease, stage 4, severely decreased glomerular filtration rate between 15-29 mL/min/1.73 square meter (HCC)    Hypertensive urgency    Acute kidney injury superimposed on chronic kidney disease ([place])    Acute CHF (congestive heart failure) ([place]) 02/27/2021   Normocytic anemia 02/27/2021   Anasarca 02/27/2021   Abscess of left foot 12/10/2020   S/P BKA (below knee amputation) unilateral, left ([place]) 12/10/2020   Dyslipidemia 12/10/2020   CKD (chronic kidney disease), stage III ([place]) 12/10/2020   HTN (hypertension) 11/12/2020   Cellulitis and abscess of foot    Diabetes mellitus with hyperglycemia ([place]) 09/07/2014   Tobacco abuse 09/07/2014   Abscess of back     Abscess of lower back 09/06/2014   DM2 (diabetes mellitus, type 2) ([place]) 09/06/2014   Sepsis ([place]) 09/06/2014   Scrotal abscess 06/20/2014    Conditions to be addressed/monitored per PCP order:  CHF and HTN  Care Plan : RN Care Manager Plan of Care  Updates made by Todd Montane, RN since 05/12/2022 12:00 AM     Problem: Chronic Disease Management and Care Coordination Needs for CHF, HTN, DM   Priority: High     Long-Range Goal: Development of Plan of Care for Chronic Disease Management and Care Coordination Needs (CHF, HTN, DM)   Start Date: 09/30/2021  Expected End Date: 07/02/2022  Priority: High  Note:   Current Barriers:  Knowledge Deficits related to plan of care for management of CHF, HTN, and DMII  Chronic Disease Management support and education needs related to CHF, HTN, and DMII Difficulty obtaining medications Recent Vision 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  RNCM Clinical Goal(s):  Patient will verbalize understanding of plan for management of CHF, HTN, and DMII as evidenced by improvement in management of these chronic diseases. verbalize basic understanding of CHF, HTN, and DMII disease process and self health management plan as evidenced by improved control of factors that cause CHF,  improved blood pressure readings < 140/90 and improved control of blood sugar readings with range with 80 - 120. take all medications exactly as prescribed and will call provider for medication related questions as evidenced by compliance with medications3/31/    attend all scheduled medical appointments: No future appointments scheduled as evidenced by attending all scheduled appointments        demonstrate improved adherence to prescribed treatment plan for CHF, HTN, and DMII as evidenced by better control of blood pressure, blood sugars and factors impacting CHF. continue to work with Consulting civil engineer and/or Social Worker to address care  management and care coordination needs related to CHF, HTN, and DMII as evidenced by adherence to CM Team Scheduled appointments     through collaboration with RN Care manager, provider, and care team.   Interventions: Inter-disciplinary care team collaboration (see longitudinal plan of care) Evaluation of current treatment plan related to  self management and patient's adherence to plan as established by provider Advised patient to request compliance packaging when requesting medication refill   Heart Failure Interventions:  (Status: Goal on Track (progressing): YES.)  Long Term Goal  Wt Readings from Last 3 Encounters:  02/09/22 190 lb (86.2 kg)  01/26/22 211 lb 3.2 oz (95.8 kg)  01/09/22 190 lb (86.2 kg)    Discussed importance of daily weight and advised patient to weigh and Mccoy daily Reviewed role of diuretics in prevention of fluid overload and management of heart failure Discussed the importance of keeping all appointments with provider Assessed social determinant of health barriers Reviewed medications, now receiving medications from [facility], advised to request compliance packaging Advised patient to take all medications and BP readings to all upcoming appointments Reviewed upcoming appointments including 05/18/22 with Pharmacist and Cardiology Collaborated with HF Clinic to assist with rescheduling missed appointment Discussed the importance of daily weights, advised to contact provider if weight gain of 2-3 pounds over night or 5 pounds in a week   Diabetes:  (Status: Goal Met.) Long Term Goal  Lab Results  Component Value Date   HGBA1C 6.9 02/24/2022    Assessed patient's understanding of A1c goal: <7% Reviewed medications with patient and discussed importance of medication adherence;        Discussed plans with patient for ongoing care management follow up and provided patient with direct contact information for care management team;      Reviewed  scheduled/upcoming provider appointments including: 04/08/22 with Pharmacist and 05/12/22 with Cardiology;         Advised patient, providing education and rationale, to check cbg once daily and Mccoy        call provider for findings outside established parameters;          Hypertension: (Status: Goal on Track (progressing): YES.) Long Term Goal Patient is taking all medications, cutting back on salt and checking BP daily Last practice recorded BP readings: at home-patient unable to recall last home reading BP Readings from Last 3 Encounters:  04/08/22 130/80  03/11/22 (!) 146/89  03/04/22 130/82  Most recent eGFR/CrCl:  Lab Results  Component Value Date   EGFR 31 (L) 05/21/2021    No components found for: "CRCL"  Reviewed medications with patient and discussed importance of compliance;  Advised patient, providing education and rationale, to monitor blood pressure daily and Mccoy, calling PCP for findings outside established parameters;  Reviewed scheduled/upcoming provider appointments including: 05/18/22 with Cardiology Pharmacist and Cardiology Reviewed recent provider notes, verified patient obtained new BP monitor  Patient Goals/Self-Care Activities: Take medications as prescribed   Attend all scheduled provider appointments Call pharmacy for medication refills 3-7 days in advance of running out of medications Call provider office for new concerns or questions  check blood sugar at prescribed times: once daily check blood pressure daily limit salt intake to 2000 mg/day       Follow Up:  Patient agrees to Care Plan and Follow-up.  Plan: The Managed Medicaid care management team will reach out to the patient again over the next 30 days.  Date/time of next scheduled RN care management/care coordination outreach:  06/14/22 @ [place] RN, [place] RN Care Coordinator

## 2022-05-14 ENCOUNTER — Other Ambulatory Visit: Payer: Self-pay | Admitting: Physician Assistant

## 2022-05-16 NOTE — Progress Notes (Unsigned)
Office Visit    Patient Name: Todd Mccoy Date of Encounter: 05/16/2022  Primary Care Provider:  [name], MD Primary Cardiologist:  Fransico Him, MD Primary Electrophysiologist: None  Chief Complaint    Todd Mccoy is a 51 y.o. 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  Past Medical History    Past Medical History:  Diagnosis Date   Anemia    Anemia    Anxiety    Chronic combined systolic (congestive) and diastolic (congestive) heart failure (HCC)    EF 40-45% with G3DD on echo 2022   CKD (chronic kidney disease), stage IV ([place]) 12/10/2020   Diabetes mellitus with complication (HCC)    Diabetic neuropathy (HCC)    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[place]) 03/25/2020   pt states retinal eye damage- left worse than the right- recent visited MD    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[place]) 03/25/2020   pt states recent MD visit /left eye worse than right   Dyslipidemia 12/10/2020   Hx of BKA, left ([place])    Hypertension    Morbid obesity ([place])    Osteomyelitis ([place])    Pneumonia    Past Surgical History:  Procedure Laterality Date   ABSCESS DRAINAGE     neck   AMPUTATION Left 11/14/2020   Procedure: LEFT 5TH RAY AMPUTATION;  Surgeon: Newt Minion, MD;  Location: [facility];  Service: Orthopedics;  Laterality: Left;   AMPUTATION Left 12/10/2020   Procedure: LEFT BELOW KNEE AMPUTATION;  Surgeon: Newt Minion, MD;  Location: [facility];  Service: Orthopedics;  Laterality: Left;   AMPUTATION TOE Right 03/25/2020   Procedure: AMPUTATION TOE;  Surgeon: Evelina Bucy, DPM;  Location: [facility];  Service: Podiatry;  Laterality: Right;   INCISION AND DRAINAGE ABSCESS Right 09/07/2014   Procedure: INCISION AND DRAINAGE ABSCESS RIGHT FLANK;  Surgeon: Jackolyn Confer, MD;  Location:  [facility];  Service: General;  Laterality: Right;   LEG AMPUTATION BELOW KNEE Left 12/10/2020   SCROTUM EXPLORATION      Allergies  No Known Allergies  History of Present Illness    Todd Mccoy  is a 51 year old male with the above mention past medical history who presents today for follow-up of hypertension.  He was initially seen during hospitalization in 2022 he presented with volume overload and hypertensive urgency.  He was found to be noncompliant with his medications BNP was greater than 4500.  Chest x-ray found bilateral pleural effusions patient was started on IV Lasix.  His blood pressures were elevated 186/114 and was found to have AKI.  2D echo was completed that showed EF of 40% with moderately decreased LV function and global hypokinesis with moderately severe LVH and grade 3 DD with moderately reduced RV function with pleural effusions present.  He was noted to have some chest discomfort that was found to be atypical.  He was discharged with Lasix, hydralazine, and carvedilol.  He had nuclear stress test completed 04/23/2021 that was negative for ischemia or infarct.  He was seen in the ED 01/09/2022 with lower extremity edema and elevated blood pressures of 205/126.  Blood work was completed showing creatinine 4.1 and BNP of 2005 with troponins of 250 trending to 252.  He was endorsing left-sided chest pain x-ray showed cardiomegaly with pulmonary vascular congestion.  He was started on Lasix and was diuresed.  He underwent myocardial amyloid imaging SPECT scan that revealed amyloid and patient was referred to advanced heart failure clinic.  He was seen last by [name], PA for follow-up on 02/2022.  During his visit patient's blood pressures were elevated and hydralazine was added 25 mg 3 times daily with follow-up scheduled for Pharm.D. for blood pressure check.  He was seen 03/04/2022 and blood pressures was almost at goal of 130/80 and patient was encouraged to continue amlodipine,  carvedilol and hydralazine.  [name] presents today for 71-month follow-up of blood pressure with his nephew.  Since last being seen in the office patient reports that he has been feeling well with no new cardiac complaints.  He is tolerating his medications well without any adverse reactions.  Blood pressures today were well controlled at 128/86.  He was volume up on examination today with +1 edema in the right lower extremity and crackles in his right lower lobe.  He is compliant with his current dose of Lasix 80 mg and states that his urine output has decreased over the past few weeks.  Patient denies chest pain, palpitations, dyspnea, PND, orthopnea, nausea, vomiting, dizziness, syncope, edema, weight gain, or early satiety.  Home Medications    Current Outpatient Medications  Medication Sig Dispense Refill   acetaminophen-codeine (TYLENOL #3) 300-30 MG tablet Take 1 tablet by mouth every 12 (twelve) hours as needed for moderate pain. (Patient not taking: Reported on 05/12/2022) 14 tablet 0   amLODipine (NORVASC) 10 MG tablet Take 1 tablet (10 mg total) by mouth daily. 30 tablet 3   atorvastatin (LIPITOR) 20 MG tablet Take 1 tablet (20 mg total) by mouth daily. 90 tablet 3   Blood Pressure Monitoring (OMRON 3 SERIES BP MONITOR) DEVI 1 each by Does not apply route daily. 1 each 0   carvedilol (COREG) 25 MG tablet Take 1 tablet (25 mg total) by mouth 2 (two) times daily. 180 tablet 3   ferrous sulfate 325 (65 FE) MG tablet Take 1 tablet (325 mg total) by mouth 3 (three) times daily with meals. (Patient taking differently: Take 325 mg by mouth daily with breakfast.) 90 tablet 2   furosemide (LASIX) 80 MG tablet Take 1 tablet (80 mg total) by mouth daily. 60 tablet 0   hydrALAZINE (APRESOLINE) 25 MG tablet Take 1 tablet (25 mg total) by mouth 3 (three) times daily. 270 tablet 3   hydrOXYzine (ATARAX) 25 MG tablet Take 1 tablet (25 mg total) by mouth at bedtime as needed. 30 tablet 1   ibuprofen  (ADVIL) 600 MG tablet Take 1 tablet (600 mg total) by mouth every 6 (six) hours as needed. 30 tablet 0   insulin glargine (LANTUS) 100 UNIT/ML Solostar Pen Inject 10 Units into the skin at bedtime. 9 mL 3   isosorbide mononitrate (IMDUR) 60 MG 24 hr tablet Take 1 tablet (60 mg total) by mouth daily. 30 tablet 3   Naphazoline HCl (CLEAR EYES OP) Place 1 drop into both eyes as needed (irritation).     polyethylene glycol powder (GLYCOLAX/MIRALAX) 17 GM/SCOOP powder Take 17 g by mouth 2 (two) times daily as needed. (Patient not taking: Reported on 02/18/2022) 3350 g 1   pregabalin (LYRICA) 50 MG capsule Take 1 capsule (50 mg total) by mouth 2 (two) times daily. 30 capsule 3   No current facility-administered medications for this visit.     Review of Systems  Please see the history of  present illness.    (+) Lower extremity edema (+) Shortness of breath  All other systems reviewed and are otherwise negative except as noted above.  Physical Exam    Wt Readings from Last 3 Encounters:  02/09/22 190 lb (86.2 kg)  01/26/22 211 lb 3.2 oz (95.8 kg)  01/09/22 190 lb (86.2 kg)   TF:TDDUK were no vitals filed for this visit.,There is no height or weight on file to calculate BMI.  Constitutional:      Appearance: Healthy appearance. Not in distress.  Neck:     Vascular: JVD normal.  Pulmonary:     Effort: Pulmonary effort is normal.     Breath sounds: No wheezing. No rales. Diminished in the bases Cardiovascular:     Normal rate. Regular rhythm. Normal S1. Normal S2.      Murmurs: There is no murmur.  Edema:    Peripheral edema absent.  Abdominal:     Palpations: Abdomen is soft non tender. There is no hepatomegaly.  Skin:    General: Skin is warm and dry.  Neurological:     General: No focal deficit present.     Mental Status: Alert and oriented to person, place and time.     Cranial Nerves: Cranial nerves are intact.  EKG/LABS/Other Studies Reviewed    ECG personally reviewed by me  today -none completed today   STOP-Bang Score:           Lab Results  Component Value Date   WBC 3.3 (L) 02/21/2022   HGB 9.5 (L) 02/21/2022   HCT 30.1 (L) 02/21/2022   MCV 87.5 02/21/2022   PLT 164 02/21/2022   Lab Results  Component Value Date   CREATININE 4.18 (H) 02/21/2022   BUN 53 (H) 02/21/2022   NA 137 02/21/2022   K 4.4 02/21/2022   CL 109 02/21/2022   CO2 20 (L) 02/21/2022   Lab Results  Component Value Date   ALT 18 05/21/2021   AST 25 05/21/2021   ALKPHOS 169 (H) 05/21/2021   BILITOT 0.4 05/21/2021   Lab Results  Component Value Date   CHOL 82 (L) 05/21/2021   HDL 27 (L) 05/21/2021   LDLCALC 40 05/21/2021   LDLDIRECT 36 05/21/2021   TRIG 67 05/21/2021   CHOLHDL 3.0 05/21/2021    Lab Results  Component Value Date   HGBA1C 6.9 02/24/2022    Assessment & Plan    1.  Essential hypertension: -Patient was recently seen by Pharm.D. for blood pressure check and pressures were slightly above goal of 131/80 with no changes made to current regimen. -Today blood pressures were was well controlled at 128/86 -Continuee amlodipine 10 mg, carvedilol 25 mg twice daily, hydralazine 25 mg 3 times daily and Imdur 60 mg daily  2.  Chronic combined systolic and diastolic CHF: -Patient's last 2D echo was 01/2022 showing EF of 38% with moderate LVH and severely decreased LV function with global hypokinesis and LVH with grade 3 DD -Today patient was volume up on exam with crackles and +2 pitting edema and right lower extremity. -He reports some indiscretions with salt with his diet but states that his fluid volume has remained stable -He was advised to take an extra 80 mg of Lasix today with plan to change to torsemide following labs today. -BNP and BMET today  3.  Lower extremity edema: -Today patient reports decreased urine output with current dose of Lasix -Continue Lasix 80 mg daily with plan to change to torsemide pending labs.  4.  CKD stage IV: -Patient is  currently followed by nephrology  5.  Tobacco abuse: -Patient encouraged to discontinue and offered advice and support when he is ready to stop.  6.  Cardiac amyloidosis: -Myocardial  amyloid imaging in 02/2022 showing equivocal study. -Patient was referred to AHF for management -Today he reports that he has not received a call from AHF services. -We will resend referral today.     Disposition: Follow-up with Fransico Him, MD or APP in 1 months    Medication Adjustments/Labs and Tests Ordered: Current medicines are reviewed at length with the patient today.  Concerns regarding medicines are outlined above.   Signed, Mable Fill, Marissa Nestle, NP 05/16/2022, 8:18 PM [facility]  Note:  This document was prepared using Dragon voice recognition software and may include unintentional dictation errors.

## 2022-05-17 ENCOUNTER — Encounter (HOSPITAL_COMMUNITY): Payer: Medicaid Other | Admitting: Cardiology

## 2022-05-17 NOTE — Telephone Encounter (Signed)
Please forward refill request to patient's PCP

## 2022-05-18 ENCOUNTER — Encounter: Payer: Self-pay | Admitting: Nurse Practitioner

## 2022-05-18 ENCOUNTER — Ambulatory Visit: Payer: Medicaid Other

## 2022-05-18 ENCOUNTER — Ambulatory Visit: Payer: Medicaid Other | Attending: Physician Assistant | Admitting: Nurse Practitioner

## 2022-05-18 VITALS — BP 128/86 | HR 59 | Ht 70.0 in

## 2022-05-18 DIAGNOSIS — I43 Cardiomyopathy in diseases classified elsewhere: Secondary | ICD-10-CM | POA: Diagnosis not present

## 2022-05-18 DIAGNOSIS — Z720 Tobacco use: Secondary | ICD-10-CM

## 2022-05-18 DIAGNOSIS — I10 Essential (primary) hypertension: Secondary | ICD-10-CM

## 2022-05-18 DIAGNOSIS — N1832 Chronic kidney disease, stage 3b: Secondary | ICD-10-CM | POA: Diagnosis not present

## 2022-05-18 DIAGNOSIS — I5042 Chronic combined systolic (congestive) and diastolic (congestive) heart failure: Secondary | ICD-10-CM

## 2022-05-18 DIAGNOSIS — E854 Organ-limited amyloidosis: Secondary | ICD-10-CM | POA: Diagnosis not present

## 2022-05-18 NOTE — Patient Instructions (Addendum)
Clinic for call events heart failure clinic medication Instructions:  TAKE AN EXTRA DOSE OF LASIX TODAY *If you need a refill on your cardiac medications before your next appointment, please call your pharmacy*   Lab Work: [place] If you have labs (blood work) drawn today and your tests are completely normal, you will receive your results only by: [place] (if you have MyChart) OR A paper copy in the mail If you have any lab test that is abnormal or we need to change your treatment, we will call you to review the results.   Testing/Procedures: NONE ORDERED   Follow-Up: At [facility], you and your health needs are our priority.  As part of our continuing mission to provide you with exceptional heart care, we have created designated Provider Care Teams.  These Care Teams include your primary Cardiologist (physician) and Advanced Practice Providers (APPs -  Physician Assistants and Nurse Practitioners) who all work together to provide you with the care you need, when you need it.  We recommend signing up for the patient portal called "MyChart".  Sign up information is provided on this After Visit Summary.  MyChart is used to connect with patients for Virtual Visits (Telemedicine).  Patients are able to view lab/test results, encounter notes, upcoming appointments, etc.  Non-urgent messages can be sent to your provider as well.   To learn more about what you can do with MyChart, go to [email or web address].    Your next appointment:   1 month(s)  The format for your next appointment:   In Person  Provider:   Ambrose Pancoast, NP       Other Instructions WE WILL BE IN [place]

## 2022-05-18 NOTE — Progress Notes (Unsigned)
Patient ID: Todd Mccoy                 DOB: 1971-02-15                      MRN: [id number]      HPI: Todd Mccoy is a 51 y.o. male referred by Dr. Radford Pax to HTN clinic. 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 Last seen in HTN clinic on 04/08/2022 where blood pressure was slightly above goal at 132/80 and 130/80. Patient's home cuff had inaccurate readings and was given a prescription for new monitor. No medications changes at the last appointment due to not being completely adherent to medications and missing one to two doses a week. Currently taking amlodipine 10 mg QD, carvedilol 25 mg BID, hydralazine 25 mg TID, lasix 80 mg QD, and Imdur 60 mg QD.  Current HTN meds: amlodipine 10 mg QD, carvedilol 25 mg BID, hydralazine 25 mg TID, furosemide 80 mg QD, Imdur 60 mg QD Previously tried: losartan 100 mg daily, losartan-hctz 100-25 mg daily (worsening kidney function) BP goal: <130/80  Family History:  Family History  Problem Relation Age of Onset   Diabetes Mother    Diabetes Father    Diabetes Brother    Colon cancer Neg Hx    Esophageal cancer Neg Hx    Pancreatic cancer Neg Hx    Stomach cancer Neg Hx    Liver disease Neg Hx    CAD Neg Hx      Social History:  Recently started smoking again, but less: half pack now. Motivated to cut down, but not ready to quit (Re-assess)--suppose to cut down from 10 to 6 cigarettes Diet:   Exercise: Wheelchair bound  Home BP readings:   Office BP readings:  Wt Readings from Last 3 Encounters:  02/09/22 190 lb (86.2 kg)  01/26/22 211 lb 3.2 oz (95.8 kg)  01/09/22 190 lb (86.2 kg)   BP Readings from Last 3 Encounters:  05/18/22 128/86  04/08/22 130/80  03/11/22 (!) 146/89   Pulse Readings from Last 3 Encounters:  05/18/22 (!) 59  04/08/22 60   03/11/22 63    Renal function: CrCl cannot be calculated (Patient's most recent lab result is older than the maximum 21 days allowed.).  Past Medical History:  Diagnosis Date   Anemia    Anemia    Anxiety    Chronic combined systolic (congestive) and diastolic (congestive) heart failure (HCC)    EF 40-45% with G3DD on echo 2022   CKD (chronic kidney disease), stage IV ([place]) 12/10/2020   Diabetes mellitus with complication (HCC)    Diabetic neuropathy ([place])    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[place]) 03/25/2020   pt states retinal eye damage- left worse than the right- recent visited MD    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[place]) 03/25/2020   pt states recent MD visit /left eye worse than right   Dyslipidemia 12/10/2020   Hx of BKA, left ([place])    Hypertension    Morbid obesity ([place])    Osteomyelitis (HCC)    Pneumonia     Current Outpatient Medications on File Prior to Visit  Medication Sig Dispense Refill   amLODipine (NORVASC) 10 MG tablet Take 1 tablet (10 mg total) by mouth daily. 30 tablet 3  atorvastatin (LIPITOR) 20 MG tablet Take 1 tablet (20 mg total) by mouth daily. 90 tablet 3   Blood Pressure Monitoring (OMRON 3 SERIES BP MONITOR) DEVI 1 each by Does not apply route daily. 1 each 0   carvedilol (COREG) 25 MG tablet Take 1 tablet (25 mg total) by mouth 2 (two) times daily. 180 tablet 3   ferrous sulfate 325 (65 FE) MG tablet Take 1 tablet (325 mg total) by mouth 3 (three) times daily with meals. (Patient taking differently: Take 325 mg by mouth daily with breakfast.) 90 tablet 2   furosemide (LASIX) 80 MG tablet Take 1 tablet (80 mg total) by mouth daily. 60 tablet 0   hydrALAZINE (APRESOLINE) 25 MG tablet Take 1 tablet (25 mg total) by mouth 3 (three) times daily. 270 tablet 3   hydrOXYzine (ATARAX) 25 MG tablet Take 1 tablet (25 mg total) by mouth at bedtime as needed. 30 tablet 1   ibuprofen (ADVIL) 600 MG tablet Take 1 tablet (600 mg total) by mouth every 6  (six) hours as needed. 30 tablet 0   insulin glargine (LANTUS) 100 UNIT/ML Solostar Pen Inject 10 Units into the skin at bedtime. 9 mL 3   isosorbide mononitrate (IMDUR) 60 MG 24 hr tablet Take 1 tablet (60 mg total) by mouth daily. 30 tablet 3   Naphazoline HCl (CLEAR EYES OP) Place 1 drop into both eyes as needed (irritation).     pregabalin (LYRICA) 50 MG capsule Take 1 capsule (50 mg total) by mouth 2 (two) times daily. 30 capsule 3   [DISCONTINUED] insulin aspart protamine- aspart (NOVOLOG MIX 70/30) (70-30) 100 UNIT/ML injection Inject 0.35 mLs (35 Units total) into the skin 2 (two) times daily. 60 mL 3   [DISCONTINUED] metFORMIN (GLUCOPHAGE-XR) 500 MG 24 hr tablet TAKE 1 TABLET (500 MG TOTAL) BY MOUTH 2 (TWO) TIMES DAILY. 60 tablet 2   No current facility-administered medications on file prior to visit.   There were no vitals taken for this visit.   Assessment/Plan: -reassess new home cuff and readings  -assess adherence: has he started the pill packs? Missing any doses still? -any issues with his meds? Side effect wise -any signs of high or low bp? -did he cut down on amount of cigarettes (10-->6) -[place] and hurt his hip in September: follow up with how he is feeling and what OTCs he has been using?      1. Hypertension -   No problem-specific Assessment & Plan notes found for this encounter.   Thank you  Sandford Craze, PharmD. [facility] PGY-1 05/18/2022 1:36 PM   Ramond Dial, Pharm.D, BCPS, CPP [facility] Division of [facility] [address], [place],  [address]  Phone: [phone]; Fax: [phone]

## 2022-05-19 LAB — BASIC METABOLIC PANEL
BUN/Creatinine Ratio: 12 (ref 9–20)
BUN: 50 mg/dL — ABNORMAL HIGH (ref 6–24)
CO2: 17 mmol/L — ABNORMAL LOW (ref 20–29)
Calcium: 7.8 mg/dL — ABNORMAL LOW (ref 8.7–10.2)
Chloride: 113 mmol/L — ABNORMAL HIGH (ref 96–106)
Creatinine, Ser: 4.17 mg/dL — ABNORMAL HIGH (ref 0.76–1.27)
Glucose: 140 mg/dL — ABNORMAL HIGH (ref 70–99)
Potassium: 4.5 mmol/L (ref 3.5–5.2)
Sodium: 140 mmol/L (ref 134–144)
eGFR: 17 mL/min/{1.73_m2} — ABNORMAL LOW (ref >59)

## 2022-05-19 LAB — PRO B NATRIURETIC PEPTIDE: NT-Pro BNP: 26946 pg/mL — ABNORMAL HIGH (ref 0–121)

## 2022-05-24 ENCOUNTER — Other Ambulatory Visit: Payer: Self-pay

## 2022-05-24 DIAGNOSIS — I5043 Acute on chronic combined systolic (congestive) and diastolic (congestive) heart failure: Secondary | ICD-10-CM

## 2022-05-28 ENCOUNTER — Other Ambulatory Visit: Payer: Self-pay | Admitting: Physician Assistant

## 2022-05-28 DIAGNOSIS — R600 Localized edema: Secondary | ICD-10-CM

## 2022-06-02 ENCOUNTER — Ambulatory Visit: Payer: Medicaid Other | Attending: Family Medicine

## 2022-06-04 DIAGNOSIS — Z419 Encounter for procedure for purposes other than remedying health state, unspecified: Secondary | ICD-10-CM | POA: Diagnosis not present

## 2022-06-07 ENCOUNTER — Telehealth (HOSPITAL_COMMUNITY): Payer: Self-pay | Admitting: Surgery

## 2022-06-07 NOTE — Telephone Encounter (Signed)
I called patient to attempt to schedule a new patient appt.  I left a message for a return call to scheduler.

## 2022-06-14 ENCOUNTER — Other Ambulatory Visit: Payer: Self-pay | Admitting: Family Medicine

## 2022-06-14 ENCOUNTER — Encounter: Payer: Self-pay | Admitting: *Deleted

## 2022-06-14 ENCOUNTER — Telehealth: Payer: Self-pay | Admitting: *Deleted

## 2022-06-14 ENCOUNTER — Other Ambulatory Visit: Payer: Medicaid Other | Admitting: *Deleted

## 2022-06-14 DIAGNOSIS — E11628 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 complications: Secondary | ICD-10-CM

## 2022-06-14 MED ORDER — PREGABALIN 50 MG PO CAPS: 50.0000 mg | ORAL_CAPSULE | Freq: Two times a day (BID) | ORAL | 1 refills | Status: DC

## 2022-06-14 MED ORDER — ATORVASTATIN CALCIUM 20 MG PO TABS: 20.0000 mg | ORAL_TABLET | Freq: Every day | ORAL | 3 refills | Status: DC

## 2022-06-14 NOTE — Telephone Encounter (Signed)
Renewal of atorvastatin sent to the pts pharmacy [facility], as okayed and advised by Dr. Radford Pax.

## 2022-06-14 NOTE — Telephone Encounter (Signed)
-----   Message from [name], MD sent at 06/14/2022 12:14 PM EST ----- Regarding: RE: medication Please call in new Rx for Atorvastatin ----- Message ----- From: Melissa Montane, RN Sent: 06/14/2022  10:02 AM EST To: [name], MD Subject: medication                                     Hi Dr. Radford Pax,  I am the Managed Medicaid Case Manager working with this patient. We are working to get his medications compliance packaged at [facility]. The pharmacist is requesting an updated prescription for Atorvastatin be sent to [facility]. Can you please send if indicated? I have asked Mr. Colucci to schedule a follow up with you. Thank you  Lurena Joiner RN, [name] [facility] RN Care Coordinator

## 2022-06-14 NOTE — Patient Instructions (Signed)
Visit Information  [name] was given information about Medicaid Managed Care team care coordination services as a part of their [facility] Medicaid benefit. Todd Mccoy verbally consented to engagement with the [facility] Managed Care team.   If you are experiencing a medical emergency, please call 911 or report to your local emergency department or urgent care.   If you have a non-emergency medical problem during routine business hours, please contact your provider's office and ask to speak with a nurse.   For questions related to your [facility] health plan, please call: [phone] or go here:[email or web address]  If you would like to schedule transportation through your [facility] plan, please call the following number at least 2 days in advance of your appointment: [phone].  You can also use the MTM portal or MTM mobile app to manage your rides. For the portal, please go to [email or web address].  Call the [place] at [phone], at any time, 24 hours a day, 7 days a week. If you are in danger or need immediate medical attention call 911.  If you would like help to quit smoking, call 1-800-QUIT-NOW 213-614-1926) OR Espaol: 1-855-Djelo-Ya (3-846-659-9357) o para ms informacin haga clic aqu or Text READY to 200-400 to register via text  [name],   Please see education materials related to HF provided as print materials.   The patient verbalized understanding of instructions, educational materials, and care plan provided today and agreed to receive a mailed copy of patient instructions, educational materials, and care plan.   Telephone follow up appointment with Managed Medicaid care management team member scheduled for:07/14/22 @ [place] RN, [name] [place] RN Care Coordinator   Following is a copy of your plan of care:  Care Plan : RN Care Manager Plan of Care  Updates made by  Melissa Montane, RN since 06/14/2022 12:00 AM     Problem: Chronic Disease Management and Care Coordination Needs for CHF, HTN, DM   Priority: High     Long-Range Goal: Development of Plan of Care for Chronic Disease Management and Care Coordination Needs (CHF, HTN, DM)   Start Date: 09/30/2021  Expected End Date: 07/02/2022  Priority: High  Note:   Current Barriers:  Knowledge Deficits related to plan of care for management of CHF, HTN, and DMII  Chronic Disease Management support and education needs related to CHF, HTN, and DMII Difficulty obtaining medications Recent Vision 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  RNCM Clinical Goal(s):  Patient will verbalize understanding of plan for management of CHF, HTN, and DMII as evidenced by improvement in management of these chronic diseases. verbalize basic understanding of CHF, HTN, and DMII disease process and self health management plan as evidenced by improved control of factors that cause CHF, improved blood pressure readings < 140/90 and improved control of blood sugar readings with range with 80 - 120. take all medications exactly as prescribed and will call provider for medication related questions as evidenced by compliance with medications3/31/    attend all scheduled medical appointments: 06/14/22 for Eye Exam, 06/17/22 with Cardiology, reschedule with HF Clinic as evidenced by attending all scheduled appointments        demonstrate improved adherence to prescribed treatment plan for CHF, HTN, and DMII as evidenced by better control of blood pressure, blood sugars and factors impacting CHF. continue to work with Consulting civil engineer and/or Social Worker to address care  management and care coordination needs related to CHF, HTN, and DMII as evidenced by adherence to CM Team Scheduled appointments     through collaboration with RN Care manager, provider, and care team.    Interventions: Inter-disciplinary care team collaboration (see longitudinal plan of care) Evaluation of current treatment plan related to  self management and patient's adherence to plan as established by provider Assisted with requesting compliance packaging with [facility] Medications reviewed with pharmacist at [facility] with multiple providers, requesting updated prescriptions to be sent to [place] Failure Interventions:  (Status: Goal on Track (progressing): YES.)  Long Term Goal  Wt Readings from Last 3 Encounters:  02/09/22 190 lb (86.2 kg)  01/26/22 211 lb 3.2 oz (95.8 kg)  01/09/22 190 lb (86.2 kg)    Discussed importance of daily weight and advised patient to weigh and record daily Reviewed role of diuretics in prevention of fluid overload and management of heart failure Discussed the importance of keeping all appointments with provider Assessed social determinant of health barriers Reviewed medications, discussed missed lab appointment Advised patient to take all medications and BP readings to all upcoming appointments Reviewed upcoming appointments including 06/14/22 for Eye Exam and 06/17/22 with Cardiology Collaborated with HF Clinic to assist with rescheduling missed appointment-message sent to D. Woods for rescheduling Discussed the importance of daily weights, advised to contact provider if weight gain of 2-3 pounds over night or 5 pounds in a week Advised patient to reschedule with Dr. Radford Pax    Hypertension: (Status: Goal on Track (progressing): YES.) Long Term Goal Patient is taking all medications, cutting back on salt and checking BP daily Last practice recorded BP readings:  BP Readings from Last 3 Encounters:  05/18/22 128/86  04/08/22 130/80  03/11/22 (!) 146/89  Most recent eGFR/CrCl:  Lab Results  Component Value Date   EGFR 17 (L) 05/18/2022    No components found for: "CRCL"  Reviewed medications with  patient and discussed importance of compliance;  Advised patient, providing education and rationale, to monitor blood pressure daily and record, calling PCP for findings outside established parameters;  Reviewed scheduled/upcoming provider appointments including: 06/14/22 for Eye Exam and 06/17/22 with Cardiology Reviewed recent provider notes, discussed missed lab appointment-patient reports he did not know about the lab appointment   Patient Goals/Self-Care Activities: Take medications as prescribed   Attend all scheduled provider appointments Call pharmacy for medication refills 3-7 days in advance of running out of medications Call provider office for new concerns or questions  check blood sugar at prescribed times: once daily check blood pressure daily limit salt intake to 2000 mg/day

## 2022-06-14 NOTE — Patient Outreach (Signed)
Medicaid Managed Care   Nurse Care Manager Note  06/14/2022 Name:  Todd Mccoy MRN:  [id number] DOB:  Oct 29, 1970  Todd Mccoy is an 51 y.o. year old male who is a primary patient of [name], MD.  The [facility] Managed Care Coordination team was consulted for assistance with:    CHF HTN  Todd Mccoy was given information about Medicaid Managed Care Coordination team services today. [name] Patient agreed to services and verbal consent obtained.  Engaged with patient by telephone for follow up visit in response to provider referral for case management and/or care coordination services.   Assessments/Interventions:  Review of past medical history, allergies, medications, health status, including review of consultants reports, laboratory and other test data, was performed as part of comprehensive evaluation and provision of chronic care management services.  SDOH (Social Determinants of Health) assessments and interventions performed: SDOH Interventions    Flowsheet Row Patient Outreach Telephone from 06/14/2022 in [place] Patient Outreach Telephone from 03/22/2022 in [place] Coordination Patient Outreach Telephone from 09/30/2021 in [place] from 02/19/2021 in [place]  SDOH Interventions      Food Insecurity Interventions -- -- Intervention Not Indicated --  Housing Interventions -- Intervention Not Indicated Intervention Not Indicated Intervention Not Indicated  Transportation Interventions Intervention Not Indicated Intervention Not Indicated Intervention Not Indicated Intervention Not Indicated  Financial Strain Interventions -- -- Intervention Not Indicated --  Physical Activity Interventions -- -- Intervention Not Indicated --  Stress Interventions -- --  Intervention Not Indicated --  Social Connections Interventions -- -- Intervention Not Indicated --       Care Plan  No Known Allergies  Medications Reviewed Today     Reviewed by Melissa Montane, RN (Registered Nurse) on 06/14/22 at [place] List Status: <None>   Medication Order Taking? Sig Documenting Provider Last Dose Status Informant  amLODipine (NORVASC) 10 MG tablet [id number] Yes Take 1 tablet (10 mg total) by mouth daily. [name], MD Taking Active   atorvastatin (LIPITOR) 20 MG tablet [id number] Yes Take 1 tablet (20 mg total) by mouth daily. [name], MD Taking Active Self  Blood Pressure Monitoring (OMRON 3 SERIES BP MONITOR) DEVI [id number] Yes 1 each by Does not apply route daily. [name], MD Taking Active   carvedilol (COREG) 25 MG tablet [id number] Yes Take 1 tablet (25 mg total) by mouth 2 (two) times daily. [name], MD Taking Active Self  ferrous sulfate 325 (65 FE) MG tablet [id number] Yes Take 1 tablet (325 mg total) by mouth 3 (three) times daily with meals.  Patient taking differently: Take 325 mg by mouth daily with breakfast.   Deatra James, MD Taking Active Self  furosemide (LASIX) 80 MG tablet [id number] Yes Take 1 tablet (80 mg total) by mouth daily. [name], MD Taking Active   hydrALAZINE (APRESOLINE) 25 MG tablet [id number] Yes Take 1 tablet (25 mg total) by mouth 3 (three) times daily. Elgie Collard, PA-C Taking Active   hydrOXYzine (ATARAX) 25 MG tablet [id number] Yes Take 1 tablet (25 mg total) by mouth at bedtime as needed. [name], MD Taking Active   ibuprofen (ADVIL) 600 MG tablet [id number] Yes Take 1 tablet (600 mg total) by mouth every 6 (six) hours as needed. [name], PA-C Taking Active  Discontinued 05/13/20 1120   insulin glargine (LANTUS) 100 UNIT/ML Solostar Pen [id number] Yes Inject 10 Units into the skin at bedtime. [name], MD Taking Active   isosorbide mononitrate (IMDUR) 60 MG 24 hr  tablet [id number] Yes Take 1 tablet (60 mg total) by mouth daily. [name], MD Taking Active     Discontinued 12/22/20 1205 (Stop Taking at Discharge)   Naphazoline HCl (CLEAR EYES OP) [id number] Yes Place 1 drop into both eyes as needed (irritation). [provider] Taking Active Self  pregabalin (LYRICA) 50 MG capsule [id number] Yes Take 1 capsule (50 mg total) by mouth 2 (two) times daily. [name], MD Taking Active             Patient Active Problem List   Diagnosis Date Noted   Renal insufficiency    Acute on chronic systolic CHF (congestive heart failure) ([place]) 01/23/2022   Prolonged QT interval 01/23/2022   Chest pain 01/23/2022   Elevated troponin 01/23/2022   Anemia due to chronic kidney disease 01/23/2022   Hypertensive emergency 01/23/2022   Hyponatremia 01/23/2022   Chronic combined systolic (congestive) and diastolic (congestive) heart failure ([place]) 04/07/2021   Chronic renal disease, stage 4, severely decreased glomerular filtration rate between 15-29 mL/min/1.73 square meter (HCC)    Hypertensive urgency    Acute kidney injury superimposed on chronic kidney disease ([place])    Acute CHF (congestive heart failure) ([place]) 02/27/2021   Normocytic anemia 02/27/2021   Anasarca 02/27/2021   Abscess of left foot 12/10/2020   S/P BKA (below knee amputation) unilateral, left ([place]) 12/10/2020   Dyslipidemia 12/10/2020   CKD (chronic kidney disease), stage III ([place]) 12/10/2020   HTN (hypertension) 11/12/2020   Cellulitis and abscess of foot    Diabetes mellitus with hyperglycemia ([place]) 09/07/2014   Tobacco abuse 09/07/2014   Abscess of back    Abscess of lower back 09/06/2014   DM2 (diabetes mellitus, type 2) ([place]) 09/06/2014   Sepsis ([place]) 09/06/2014   Scrotal abscess 06/20/2014    Conditions to be addressed/monitored per PCP order:  CHF and HTN  Care Plan : RN Care Manager Plan of Care  Updates made by Melissa Montane, RN since 06/14/2022 12:00 AM      Problem: Chronic Disease Management and Care Coordination Needs for CHF, HTN, DM   Priority: High     Long-Range Goal: Development of Plan of Care for Chronic Disease Management and Care Coordination Needs (CHF, HTN, DM)   Start Date: 09/30/2021  Expected End Date: 07/02/2022  Priority: High  Note:   Current Barriers:  Knowledge Deficits related to plan of care for management of CHF, HTN, and DMII  Chronic Disease Management support and education needs related to CHF, HTN, and DMII Difficulty obtaining medications Recent Vision 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  RNCM Clinical Goal(s):  Patient will verbalize understanding of plan for management of CHF, HTN, and DMII as evidenced by improvement in management of these chronic diseases. verbalize basic understanding of CHF, HTN, and DMII disease process and self health management plan as evidenced by improved control of factors that cause CHF, improved blood pressure readings < 140/90 and improved control of blood sugar readings with range with 80 - 120. take all medications exactly as prescribed and will call provider for medication related questions as evidenced by compliance with medications3/31/    attend all scheduled medical appointments: 06/14/22 for Eye Exam, 06/17/22 with Cardiology, reschedule with HF  Clinic as evidenced by attending all scheduled appointments        demonstrate improved adherence to prescribed treatment plan for CHF, HTN, and DMII as evidenced by better control of blood pressure, blood sugars and factors impacting CHF. continue to work with Consulting civil engineer and/or Social Worker to address care management and care coordination needs related to CHF, HTN, and DMII as evidenced by adherence to CM Team Scheduled appointments     through collaboration with Consulting civil engineer, provider, and care team.   Interventions: Inter-disciplinary care team collaboration (see  longitudinal plan of care) Evaluation of current treatment plan related to  self management and patient's adherence to plan as established by provider Assisted with requesting compliance packaging with [facility] Medications reviewed with pharmacist at [facility] with multiple providers, requesting updated prescriptions to be sent to [place]   Heart Failure Interventions:  (Status: Goal on Track (progressing): YES.)  Long Term Goal  Wt Readings from Last 3 Encounters:  02/09/22 190 lb (86.2 kg)  01/26/22 211 lb 3.2 oz (95.8 kg)  01/09/22 190 lb (86.2 kg)    Discussed importance of daily weight and advised patient to weigh and record daily Reviewed role of diuretics in prevention of fluid overload and management of heart failure Discussed the importance of keeping all appointments with provider Assessed social determinant of health barriers Reviewed medications, discussed missed lab appointment Advised patient to take all medications and BP readings to all upcoming appointments Reviewed upcoming appointments including 06/14/22 for Eye Exam and 06/17/22 with Cardiology Collaborated with HF Clinic to assist with rescheduling missed appointment-message sent to D. Woods for rescheduling Discussed the importance of daily weights, advised to contact provider if weight gain of 2-3 pounds over night or 5 pounds in a week Advised patient to reschedule with Dr. Radford Pax    Hypertension: (Status: Goal on Track (progressing): YES.) Long Term Goal Patient is taking all medications, cutting back on salt and checking BP daily Last practice recorded BP readings:  BP Readings from Last 3 Encounters:  05/18/22 128/86  04/08/22 130/80  03/11/22 (!) 146/89  Most recent eGFR/CrCl:  Lab Results  Component Value Date   EGFR 17 (L) 05/18/2022    No components found for: "CRCL"  Reviewed medications with patient and discussed importance of compliance;  Advised patient,  providing education and rationale, to monitor blood pressure daily and record, calling PCP for findings outside established parameters;  Reviewed scheduled/upcoming provider appointments including: 06/14/22 for Eye Exam and 06/17/22 with Cardiology Reviewed recent provider notes, discussed missed lab appointment-patient reports he did not know about the lab appointment   Patient Goals/Self-Care Activities: Take medications as prescribed   Attend all scheduled provider appointments Call pharmacy for medication refills 3-7 days in advance of running out of medications Call provider office for new concerns or questions  check blood sugar at prescribed times: once daily check blood pressure daily limit salt intake to 2000 mg/day       Follow Up:  Patient agrees to Care Plan and Follow-up.  Plan: The Managed Medicaid care management team will reach out to the patient again over the next 30 days.  Date/time of next scheduled RN care management/care coordination outreach:  07/14/22 @ [place] RN, [place] RN Care Coordinator

## 2022-06-16 ENCOUNTER — Telehealth: Payer: Self-pay | Admitting: *Deleted

## 2022-06-16 ENCOUNTER — Other Ambulatory Visit: Payer: Self-pay | Admitting: Physician Assistant

## 2022-06-16 DIAGNOSIS — R600 Localized edema: Secondary | ICD-10-CM

## 2022-06-16 MED ORDER — HYDRALAZINE HCL 25 MG PO TABS: 25.0000 mg | ORAL_TABLET | Freq: Three times a day (TID) | ORAL | 2 refills | Status: DC

## 2022-06-16 NOTE — Telephone Encounter (Signed)
Rx has been sent in as requested.  

## 2022-06-16 NOTE — Telephone Encounter (Signed)
-----   Message from Elgie Collard, [place] sent at 06/16/2022 12:15 PM EST ----- Regarding: FW: medication Misao Fackrell,   Can we send an updated prescription for Hydralazine and give him a few refills please.   Thank you! Elgie Collard, PA-C  ----- Message ----- From: Melissa Montane, RN Sent: 06/14/2022   9:52 AM EST To: Elgie Collard, PA-C Subject: medication                                     Hi,  I am the Managed Medicaid Case Manager working with this patient. I am helping him with compliance packaging at [facility]. The pharmacist is requesting a new prescription for hydralazine be sent to [facility]. Can you please send? Thank you  Lurena Joiner RN, [name] [place] RN Care Coordinator

## 2022-06-16 NOTE — Progress Notes (Addendum)
Office Visit    Patient Name: Todd Mccoy Date of Encounter: 06/17/2022  Primary Care Provider:  [name], MD Primary Cardiologist:  Fransico Him, MD Primary Electrophysiologist: None  Chief Complaint    Todd Mccoy is a 51 y.o. 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 Past Medical History    Past Medical History:  Diagnosis Date   Anemia    Anemia    Anxiety    Chronic combined systolic (congestive) and diastolic (congestive) heart failure (HCC)    EF 40-45% with G3DD on echo 2022   CKD (chronic kidney disease), stage IV ([place]) 12/10/2020   Diabetes mellitus with complication (HCC)    Diabetic neuropathy (HCC)    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[place]) 03/25/2020   pt states retinal eye damage- left worse than the right- recent visited MD    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[place]) 03/25/2020   pt states recent MD visit /left eye worse than right   Dyslipidemia 12/10/2020   Hx of BKA, left ([place])    Hypertension    Morbid obesity ([place])    Osteomyelitis ([place])    Pneumonia    Past Surgical History:  Procedure Laterality Date   ABSCESS DRAINAGE     neck   AMPUTATION Left 11/14/2020   Procedure: LEFT 5TH RAY AMPUTATION;  Surgeon: Newt Minion, MD;  Location: [facility];  Service: Orthopedics;  Laterality: Left;   AMPUTATION Left 12/10/2020   Procedure: LEFT BELOW KNEE AMPUTATION;  Surgeon: Newt Minion, MD;  Location: [facility];  Service: Orthopedics;  Laterality: Left;   AMPUTATION TOE Right 03/25/2020   Procedure: AMPUTATION TOE;  Surgeon: Evelina Bucy, DPM;  Location: [facility];  Service: Podiatry;  Laterality: Right;   INCISION AND DRAINAGE ABSCESS Right 09/07/2014   Procedure: INCISION AND DRAINAGE ABSCESS RIGHT FLANK;  Surgeon: Jackolyn Confer, MD;  Location: [facility];  Service: General;  Laterality: Right;   LEG AMPUTATION BELOW KNEE Left 12/10/2020   SCROTUM EXPLORATION      Allergies  No Known Allergies  History of Present Illness    Todd Mccoy  is a 51 year old male with the above mention past medical history who presents today for follow-up of hypertension.  He was initially seen during hospitalization in 2022 he presented with volume overload and hypertensive urgency.  He was found to be noncompliant with his medications BNP was greater than 4500.  Chest x-ray found bilateral pleural effusions patient was started on IV Lasix.  His blood pressures were elevated 186/114 and was found to have AKI.  2D echo was completed that showed EF of 40% with moderately decreased LV function and global hypokinesis with moderately severe LVH and grade 3 DD with moderately reduced RV function with pleural effusions present.  He was noted to have some chest discomfort that was found to be atypical.  He was discharged with Lasix, hydralazine, and carvedilol.  He had nuclear stress test completed 04/23/2021 that was negative for ischemia or infarct.    He was seen in the ED 01/09/2022 with lower extremity edema and elevated blood pressures of 205/126.  Blood work was completed showing creatinine 4.1 and BNP of 2005 with troponins of 250 trending to 252.  He was endorsing left-sided chest pain x-ray showed cardiomegaly with pulmonary  vascular congestion.  He was started on Lasix and was diuresed.  He underwent myocardial amyloid imaging SPECT scan that revealed amyloid and patient was referred to advanced heart failure clinic.  He was seen last by [name], PA for follow-up on 02/2022.  During his visit patient's blood pressures were elevated and hydralazine was added 25 mg 3 times daily with follow-up scheduled for Pharm.D. for blood pressure check.  He was seen 03/04/2022 and blood pressures was almost at goal of 130/80 and patient was encouraged to continue  amlodipine, carvedilol and hydralazine.  He was seen 05/18/2022 for 24-month follow-up of blood pressure.  During visit patient's blood pressure was controlled however he was volume up on examination.  Patient's Lasix was increased to 80 mg x 1 and was switched to torsemide following elevated BNP measurement.   Todd Mccoy presents today with his nephew for 1 month follow-up.  Since last being seen in the office patient reports that he is still experiencing lower extremity swelling but reports no shortness of breath or chest pain.  His blood pressure today was initially elevated at 148/90 and was 138/88 on recheck.  He reports compliance with his current medication regimen and denies any adverse reactions.  He is volume up on examination but denies any orthopnea or shortness of breath.  He does report occasional sodium indiscretions but for the most part says he follows a low-sodium diet.  During her previous visit his Lasix was discontinued and torsemide was ordered.  However patient never picked up new prescription for torsemide.  He is currently not checking blood pressures at home and was instructed to begin checking them when possible. Patient denies chest pain, palpitations, dyspnea, PND, orthopnea, nausea, vomiting, dizziness, syncope, edema, weight gain, or early satiety.  Home Medications    Current Outpatient Medications  Medication Sig Dispense Refill   amLODipine (NORVASC) 10 MG tablet Take 1 tablet (10 mg total) by mouth daily. 30 tablet 3   atorvastatin (LIPITOR) 20 MG tablet Take 1 tablet (20 mg total) by mouth daily. 90 tablet 3   Blood Pressure Monitoring (OMRON 3 SERIES BP MONITOR) DEVI 1 each by Does not apply route daily. 1 each 0   hydrALAZINE (APRESOLINE) 25 MG tablet Take 1 tablet (25 mg total) by mouth 3 (three) times daily. 270 tablet 2   hydrOXYzine (ATARAX) 25 MG tablet Take 1 tablet (25 mg total) by mouth at bedtime as needed. 30 tablet 1   ibuprofen (ADVIL) 600 MG tablet  Take 1 tablet (600 mg total) by mouth every 6 (six) hours as needed. 30 tablet 0   insulin glargine (LANTUS) 100 UNIT/ML Solostar Pen Inject 10 Units into the skin at bedtime. 9 mL 3   isosorbide mononitrate (IMDUR) 60 MG 24 hr tablet Take 1 tablet (60 mg total) by mouth daily. 30 tablet 3   Naphazoline HCl (CLEAR EYES OP) Place 1 drop into both eyes as needed (irritation).     pregabalin (LYRICA) 50 MG capsule Take 1 capsule (50 mg total) by mouth 2 (two) times daily. 60 capsule 1   torsemide (DEMADEX) 20 MG tablet Take 1 tablet (20 mg total) by mouth 2 (two) times daily. 60 tablet 3   carvedilol (COREG) 25 MG tablet Take 1.5 tablets (37.5 mg total) by mouth 2 (two) times daily. 90 tablet 3   ferrous sulfate 325 (65 FE) MG tablet Take 1 tablet (325 mg total) by mouth 3 (three) times daily with meals. (Patient taking differently: Take 325  mg by mouth daily with breakfast.) 90 tablet 2   No current facility-administered medications for this visit.     Review of Systems  Please see the history of present illness.    (+) Lower extremity swelling (+) Shortness of breath  All other systems reviewed and are otherwise negative except as noted above.  Physical Exam    Wt Readings from Last 3 Encounters:  02/09/22 190 lb (86.2 kg)  01/26/22 211 lb 3.2 oz (95.8 kg)  01/09/22 190 lb (86.2 kg)   VS: Vitals:   06/17/22 1133 06/17/22 1220  BP: (!) 148/90 138/88  Pulse: (!) 59   SpO2: 99%   ,Body mass index is 27.26 kg/m.  Constitutional:      Appearance: Healthy appearance. Not in distress.  Neck:     Vascular: JVD normal.  Pulmonary:     Effort: Pulmonary effort is normal.     Breath sounds: No wheezing. No rales. Diminished in the bases Cardiovascular:     Normal rate. Regular rhythm.  S3 gallop present normal S1. Normal S2.      Murmurs: There is no murmur.  Edema:    Peripheral edema present +2 and right lower extremity Abdominal:     Palpations: Abdomen is soft non tender.  There is no hepatomegaly.  Skin:    General: Skin is warm and dry.  Neurological:     General: No focal deficit present.     Mental Status: Alert and oriented to person, place and time.     Cranial Nerves: Cranial nerves are intact.  EKG/LABS/Other Studies Reviewed    ECG personally reviewed by me today -none completed today   STOP-Bang Score:           Lab Results  Component Value Date   WBC 3.3 (L) 02/21/2022   HGB 9.5 (L) 02/21/2022   HCT 30.1 (L) 02/21/2022   MCV 87.5 02/21/2022   PLT 164 02/21/2022   Lab Results  Component Value Date   CREATININE 4.17 (H) 05/18/2022   BUN 50 (H) 05/18/2022   NA 140 05/18/2022   K 4.5 05/18/2022   CL 113 (H) 05/18/2022   CO2 17 (L) 05/18/2022   Lab Results  Component Value Date   ALT 18 05/21/2021   AST 25 05/21/2021   ALKPHOS 169 (H) 05/21/2021   BILITOT 0.4 05/21/2021   Lab Results  Component Value Date   CHOL 82 (L) 05/21/2021   HDL 27 (L) 05/21/2021   LDLCALC 40 05/21/2021   LDLDIRECT 36 05/21/2021   TRIG 67 05/21/2021   CHOLHDL 3.0 05/21/2021    Lab Results  Component Value Date   HGBA1C 6.9 02/24/2022    Assessment & Plan    1.  Essential hypertension: -Patient was recently seen by Pharm.D. for blood pressure check and pressures were slightly above goal of 131/80 with no changes made to current regimen. -Today blood pressures were was well controlled at 128/86 -Continuee amlodipine 10 mg,  hydralazine 25 mg 3 times daily and Imdur 60 mg daily -Carvedilol increased to 37.5 for better blood pressure control.   2.  Chronic combined systolic and diastolic CHF: -Patient's last 2D echo was 01/2022 showing EF of 38% with moderate LVH and severely decreased LV function with global hypokinesis and LVH with grade 3 DD -Today patient was volume up on exam with crackles and +2 pitting edema and right lower extremity. -He reports some indiscretions with salt with his diet but states that his fluid volume has  remained  stable -Patient was advised to change Lasix to torsemide following previous lab results fortunately patient never picked up prescription for torsemide. -Discontinue Lasix and began torsemide 20 mg twice daily -BMET in 1 week -Low sodium diet, fluid restriction <2L, and daily weights encouraged. Educated to contact our office for weight gain of 2 lbs overnight or 5 lbs in one week.    3.  Lower extremity edema: -Today patient reports decreased urine output with current dose of Lasix -Continue Lasix 80 mg daily with plan to change to torsemide pending labs.   4.  CKD stage IV: -Ambulatory referral to nephrology   5.  Tobacco abuse: -Patient encouraged to discontinue and offered advice and support when he is ready to stop.   6.  Cardiac amyloidosis: -Myocardial  amyloid imaging in 02/2022 showing equivocal study. -Patient was referred to AHF for management on last visit -Today he reports that he has not received a call from AHF services but documentation shows that call was placed with no returned follow-up call from patient. -He was informed to reach out to advanced heart failure team for scheduling.  Disposition: Follow-up with Fransico Him, MD or APP in 3 months    Medication Adjustments/Labs and Tests Ordered: Current medicines are reviewed at length with the patient today.  Concerns regarding medicines are outlined above.   Signed, Mable Fill, Marissa Nestle, NP 06/17/2022, 12:24 PM [facility]  Note:  This document was prepared using Dragon voice recognition software and may include unintentional dictation errors.

## 2022-06-17 ENCOUNTER — Encounter: Payer: Self-pay | Admitting: Nurse Practitioner

## 2022-06-17 ENCOUNTER — Ambulatory Visit: Payer: Medicaid Other | Attending: Nurse Practitioner | Admitting: Nurse Practitioner

## 2022-06-17 VITALS — BP 138/88 | HR 59 | Ht 70.0 in

## 2022-06-17 DIAGNOSIS — R600 Localized edema: Secondary | ICD-10-CM

## 2022-06-17 DIAGNOSIS — I10 Essential (primary) hypertension: Secondary | ICD-10-CM

## 2022-06-17 DIAGNOSIS — R7989 Other specified abnormal findings of blood chemistry: Secondary | ICD-10-CM | POA: Diagnosis not present

## 2022-06-17 DIAGNOSIS — E854 Organ-limited amyloidosis: Secondary | ICD-10-CM | POA: Diagnosis not present

## 2022-06-17 DIAGNOSIS — I5042 Chronic combined systolic (congestive) and diastolic (congestive) heart failure: Secondary | ICD-10-CM | POA: Diagnosis not present

## 2022-06-17 DIAGNOSIS — Z720 Tobacco use: Secondary | ICD-10-CM

## 2022-06-17 DIAGNOSIS — I43 Cardiomyopathy in diseases classified elsewhere: Secondary | ICD-10-CM | POA: Diagnosis not present

## 2022-06-17 MED ORDER — TORSEMIDE 20 MG PO TABS: 20.0000 mg | ORAL_TABLET | Freq: Two times a day (BID) | ORAL | 3 refills | Status: DC

## 2022-06-17 MED ORDER — CARVEDILOL 25 MG PO TABS: 37.5000 mg | ORAL_TABLET | Freq: Two times a day (BID) | ORAL | 3 refills | Status: DC

## 2022-06-17 NOTE — Patient Instructions (Signed)
Medication Instructions:  STOP Lasix START Torsemide 20mg  take 1 tablet twice a day  INCREASE Coreg to 37.5mg  take 1 tablet once a day  *If you need a refill on your cardiac medications before your next appointment, please call your pharmacy*   Lab Work: [place] BMET If you have labs (blood work) drawn today and your tests are completely normal, you will receive your results only by: [place] (if you have MyChart) OR A paper copy in the mail If you have any lab test that is abnormal or we need to change your treatment, we will call you to review the results.   Testing/Procedures: NONE ORDERED   Follow-Up: At [facility], you and your health needs are our priority.  As part of our continuing mission to provide you with exceptional heart care, we have created designated Provider Care Teams.  These Care Teams include your primary Cardiologist (physician) and Advanced Practice Providers (APPs -  Physician Assistants and Nurse Practitioners) who all work together to provide you with the care you need, when you need it.  We recommend signing up for the patient portal called "MyChart".  Sign up information is provided on this After Visit Summary.  MyChart is used to connect with patients for Virtual Visits (Telemedicine).  Patients are able to view lab/test results, encounter notes, upcoming appointments, etc.  Non-urgent messages can be sent to your provider as well.   To learn more about what you can do with MyChart, go to [email or web address].    Your next appointment:   3 month(s)  The format for your next appointment:   In Person  Provider:   Ambrose Pancoast, NP     T  Other Instructions You have been referred to NEPHROLOGY.  Important Information About Sugar

## 2022-06-21 ENCOUNTER — Ambulatory Visit (HOSPITAL_COMMUNITY)
Admission: RE | Admit: 2022-06-21 | Discharge: 2022-06-21 | Disposition: A | Discharge status: Home / Self Care | Payer: Medicaid Other | Source: Ambulatory Visit | Attending: Cardiology | Admitting: Cardiology

## 2022-06-21 ENCOUNTER — Encounter (HOSPITAL_COMMUNITY): Payer: Self-pay | Admitting: Cardiology

## 2022-06-21 VITALS — BP 150/80 | HR 63

## 2022-06-21 DIAGNOSIS — E1140 Type 2 diabetes mellitus with diabetic neuropathy, unspecified: Secondary | ICD-10-CM | POA: Insufficient documentation

## 2022-06-21 DIAGNOSIS — I5042 Chronic combined systolic (congestive) and diastolic (congestive) heart failure: Secondary | ICD-10-CM | POA: Insufficient documentation

## 2022-06-21 DIAGNOSIS — Z794 Long term (current) use of insulin: Secondary | ICD-10-CM | POA: Insufficient documentation

## 2022-06-21 DIAGNOSIS — H548 Legal blindness, as defined in USA: Secondary | ICD-10-CM | POA: Insufficient documentation

## 2022-06-21 DIAGNOSIS — E1122 Type 2 diabetes mellitus with diabetic chronic kidney disease: Secondary | ICD-10-CM | POA: Diagnosis not present

## 2022-06-21 DIAGNOSIS — I130 Hypertensive heart and chronic kidney disease with heart failure and stage 1 through stage 4 chronic kidney disease, or unspecified chronic kidney disease: Secondary | ICD-10-CM | POA: Diagnosis not present

## 2022-06-21 DIAGNOSIS — N184 Chronic kidney disease, stage 4 (severe): Secondary | ICD-10-CM | POA: Insufficient documentation

## 2022-06-21 DIAGNOSIS — E854 Organ-limited amyloidosis: Secondary | ICD-10-CM

## 2022-06-21 DIAGNOSIS — I150 Renovascular hypertension: Secondary | ICD-10-CM

## 2022-06-21 DIAGNOSIS — Z89512 Acquired absence of left leg below knee: Secondary | ICD-10-CM | POA: Insufficient documentation

## 2022-06-21 DIAGNOSIS — Z993 Dependence on wheelchair: Secondary | ICD-10-CM | POA: Diagnosis not present

## 2022-06-21 DIAGNOSIS — R5381 Other malaise: Secondary | ICD-10-CM | POA: Diagnosis not present

## 2022-06-21 DIAGNOSIS — R0602 Shortness of breath: Secondary | ICD-10-CM | POA: Diagnosis not present

## 2022-06-21 DIAGNOSIS — Z79899 Other long term (current) drug therapy: Secondary | ICD-10-CM | POA: Diagnosis not present

## 2022-06-21 DIAGNOSIS — R600 Localized edema: Secondary | ICD-10-CM | POA: Insufficient documentation

## 2022-06-21 DIAGNOSIS — I43 Cardiomyopathy in diseases classified elsewhere: Secondary | ICD-10-CM | POA: Diagnosis not present

## 2022-06-21 LAB — BASIC METABOLIC PANEL
Anion gap: 8 (ref 5–15)
BUN: 59 mg/dL — ABNORMAL HIGH (ref 6–20)
CO2: 19 mmol/L — ABNORMAL LOW (ref 22–32)
Calcium: 8.2 mg/dL — ABNORMAL LOW (ref 8.9–10.3)
Chloride: 115 mmol/L — ABNORMAL HIGH (ref 98–111)
Creatinine, Ser: 4.49 mg/dL — ABNORMAL HIGH (ref 0.61–1.24)
GFR, Estimated: 15 mL/min — ABNORMAL LOW (ref >60)
Glucose, Bld: 90 mg/dL (ref 70–99)
Potassium: 4.5 mmol/L (ref 3.5–5.1)
Sodium: 142 mmol/L (ref 135–145)

## 2022-06-21 LAB — BRAIN NATRIURETIC PEPTIDE: B Natriuretic Peptide: >4500 pg/mL — ABNORMAL HIGH (ref 0.0–100.0)

## 2022-06-21 MED ORDER — HYDRALAZINE HCL 50 MG PO TABS: 50.0000 mg | ORAL_TABLET | Freq: Three times a day (TID) | ORAL | 3 refills | Status: DC

## 2022-06-21 NOTE — Patient Instructions (Addendum)
INCREASE Hydralazine to 50 mg Three times a day  INCREASE Torsemide to 80 mg for 1 week then return to normal dose  Labs done today, your results will be available in MyChart, we will contact you for abnormal readings.  Repeat blood work in 1 week  Your physician recommends that you schedule a follow-up appointment in: 6 weeks  If you have any questions or concerns before your next appointment please send [place] a message through [place] or call our office at [phone].    TO LEAVE A MESSAGE FOR THE NURSE SELECT OPTION 2, PLEASE LEAVE A MESSAGE INCLUDING: YOUR NAME DATE OF BIRTH CALL BACK NUMBER REASON FOR CALL**this is important as we prioritize the call backs  YOU WILL RECEIVE A CALL BACK THE SAME DAY AS LONG AS YOU CALL BEFORE 4:00 PM  At the [facility], you and your health needs are our priority. As part of our continuing mission to provide you with exceptional heart care, we have created designated Provider Care Teams. These Care Teams include your primary Cardiologist (physician) and Advanced Practice Providers (APPs- Physician Assistants and Nurse Practitioners) who all work together to provide you with the care you need, when you need it.   You may see any of the following providers on your designated Care Team at your next follow up: Dr Glori Bickers Dr Loralie Champagne Dr. Roxana Hires, NP Lyda Jester, [place] [facility] [place], [place] Forestine Na, NP Audry Riles, PharmD   Please be sure to bring in all your medications bottles to every appointment.

## 2022-06-22 NOTE — Progress Notes (Signed)
ADVANCED HEART FAILURE CLINIC NOTE  Referring Physician: [name], MD  Primary Care: [name], MD Primary Cardiologist: Fransico Him, MD  HPI: Todd Mccoy is a 51 y.o. male with HTN, HLD, CKD IV, T2DM w/ neuropathy, nephropathy (nephrotic syndrome) & retinopathy, HFpEF who is legally blind and s/p L foot BKA for osteomyelitis presenting today to establish care and evaluation for possible cardiac amyloid. At baseline his functional status and ability to perform ADLs is limited by BKA requiring wheelchair and legal blindness from advanced retinopathy. Over the past 1 year, [name] reports becoming significant debilitated due to shortness of breath and severe lower extremity edema. In August 2022, he was admitted with diffuse anasarca & edema requiring IV diuretics & metolazone. Since that time he has followed with Dr. Radford Pax and nephrology for outpatient diuretics and GDMT. In addition this heart failure and CKD he has difficult to control BP with systolics often ranging from 150-170. He was seen in the ED for LE edema and shortness of breath in June 2023 with BP at the time as high as 206/126. During that admission he was diuresed with IV lasix and at some point underwent a PYP scan that equivocal for cardiac amyloid.   Activity level/exercise tolerance:  Poor, NYHA III-IV but limited by significant deconditioning, LE edema and blindness Orthopnea:  Sleeps on 3-4 pillows Paroxysmal noctural dyspnea:  yes Chest pain/pressure:  no Orthostatic lightheadedness:  no Palpitations:  no Lower extremity edema:  yes, 3-4+ Presyncope/syncope:  no Cough:  no  Past Medical History:  Diagnosis Date   Anemia    Anemia    Anxiety    Chronic combined systolic (congestive) and diastolic (congestive) heart failure (HCC)    EF 40-45% with G3DD on echo 2022   CKD (chronic kidney disease), stage IV ([place]) 12/10/2020   Diabetes mellitus with complication ([place])    Diabetic neuropathy  ([place])    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[place]) 03/25/2020   pt states retinal eye damage- left worse than the right- recent visited MD    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[place]) 03/25/2020   pt states recent MD visit /left eye worse than right   Dyslipidemia 12/10/2020   Hx of BKA, left (HCC)    Hypertension    Morbid obesity ([place])    Osteomyelitis (HCC)    Pneumonia     Current Outpatient Medications  Medication Sig Dispense Refill   amLODipine (NORVASC) 10 MG tablet Take 1 tablet (10 mg total) by mouth daily. 30 tablet 3   atorvastatin (LIPITOR) 20 MG tablet Take 1 tablet (20 mg total) by mouth daily. 90 tablet 3   Blood Pressure Monitoring (OMRON 3 SERIES BP MONITOR) DEVI 1 each by Does not apply route daily. 1 each 0   carvedilol (COREG) 25 MG tablet Take 1.5 tablets (37.5 mg total) by mouth 2 (two) times daily. 90 tablet 3   ferrous sulfate 325 (65 FE) MG EC tablet Take 325 mg by mouth daily with breakfast.     hydrOXYzine (ATARAX) 25 MG tablet Take 1 tablet (25 mg total) by mouth at bedtime as needed. 30 tablet 1   ibuprofen (ADVIL) 600 MG tablet Take 1 tablet (600 mg total) by mouth every 6 (six) hours as needed. 30 tablet 0   insulin glargine (LANTUS) 100 UNIT/ML Solostar Pen Inject 10 Units into the skin at bedtime. 9 mL 3   isosorbide mononitrate (IMDUR) 60 MG 24 hr tablet Take 1 tablet (60  mg total) by mouth daily. 30 tablet 3   Naphazoline HCl (CLEAR EYES OP) Place 1 drop into both eyes as needed (irritation).     pregabalin (LYRICA) 50 MG capsule Take 1 capsule (50 mg total) by mouth 2 (two) times daily. 60 capsule 1   torsemide (DEMADEX) 20 MG tablet Take 1 tablet (20 mg total) by mouth 2 (two) times daily. 60 tablet 3   hydrALAZINE (APRESOLINE) 50 MG tablet Take 1 tablet (50 mg total) by mouth 3 (three) times daily. 180 tablet 3   No current facility-administered medications for this encounter.    No Known Allergies    Social History   Socioeconomic History    Marital status: Single    Spouse name: Not on file   Number of children: 1   Years of education: Not on file   Highest education level: Not on file  Occupational History   Not on file  Tobacco Use   Smoking status: Some Days    Packs/day: 1.00    Years: 32.00    Total pack years: 32.00    Types: Cigarettes    Last attempt to quit: 08/24/2021    Years since quitting: 0.8    Passive exposure: Past   Smokeless tobacco: Former   Tobacco comments:    Quit smoking previously February 2023  Vaping Use   Vaping Use: Never used  Substance and Sexual Activity   Alcohol use: No   Drug use: No   Sexual activity: Not on file  Other Topics Concern   Not on file  Social History Narrative   Not on file   Social Determinants of Health   Financial Resource Strain: Low Risk  (09/30/2021)   Overall Financial Resource Strain (CARDIA)    Difficulty of Paying Living Expenses: Not hard at all  Food Insecurity: No Food Insecurity (09/30/2021)   Hunger Vital Sign    Worried About Running Out of Food in the Last Year: Never true    [place] in the Last Year: Never true  Transportation Needs: No Transportation Needs (06/14/2022)   PRAPARE - Hydrologist (Medical): No    Lack of Transportation (Non-Medical): No  Physical Activity: Inactive (09/30/2021)   Exercise Vital Sign    Days of Exercise per Week: 0 days    Minutes of Exercise per Session: 0 min  Stress: No Stress Concern Present (09/30/2021)   [place]    Feeling of Stress : Only a little  Social Connections: Moderately Isolated (09/30/2021)   Social Connection and Isolation Panel [NHANES]    Frequency of Communication with Friends and Family: More than three times a week    Frequency of Social Gatherings with Friends and Family: More than three times a week    Attends Religious Services: 1 to 4 times per year    Active Member of [employer]  or Organizations: No    Attends Music therapist: Never    Marital Status: Never married  Human resources officer Violence: Not on file      Family History  Problem Relation Age of Onset   Diabetes Mother    Diabetes Father    Diabetes Brother    Colon cancer Neg Hx    Esophageal cancer Neg Hx    Pancreatic cancer Neg Hx    Stomach cancer Neg Hx    Liver disease Neg Hx    CAD Neg Hx  PHYSICAL EXAM: Vitals:   06/21/22 1144  BP: (!) 150/80  Pulse: 63  SpO2: 94%   GENERAL: chronically ill appearing AAM in wheelchair HEENT: Negative for arcus senilis or xanthelasma. There is no scleral icterus.  The mucous membranes are pink and moist.   NECK: Supple, No masses. Normal carotid upstrokes without bruits. No masses or thyromegaly.    CHEST: There are no chest wall deformities. There is no chest wall tenderness. Respirations are unlabored.  Lungs- decreased lung sounds at bases CARDIAC:  JVP: elevated to mandible, 14-16 cm H2O         Normal S1, S2  Normal rate with regular rhythm. No murmurs, rubs or gallops.  Pulses are 2+ and symmetrical in upper and lower extremities. 3-4+ chronic pitting edema.  ABDOMEN: Soft, non-tender, non-distended. There are no masses or hepatomegaly. There are normal bowel sounds.  EXTREMITIES: chronic wounds, BKA; 3-4+ edema LYMPHATIC: No axillary or supraclavicular lymphadenopathy.  NEUROLOGIC: Patient is oriented x3 with no focal or lateralizing neurologic deficits.  PSYCH: Patients affect is appropriate, there is no evidence of anxiety or depression.   DATA REVIEW  ECG: 06/21/22: NSR  ECHO: 01/23/22: LVEF 40%, severe LVH. RV function moderately reduced.  02/07/21: LVEF 40%, GIII DD, RV function moderately reduced    ASSESSMENT & PLAN:  Heart failure with reduced EF Etiology of HF: hypertensive NYHA class / AHA Stage:NYHA III-IV, significantly limited by T2DM leading to chronic wounds, BKA, blindness; now wheelchair bound. Also has  significant proteinuria on U/A from August 2023.  Volume status & Diuretics: Hypervolemic, increase torsemide to 80mg  daily. Will likely need torsemide 100mg  and metolazone to augment diuresis.  Vasodilators:increase hydralazine to 50mg  TID, continue imdur 60mg . Amlodipine 10mg  daily.  Beta-Blocker:coreg 25mg  BID MRA  advanced CKD Cardiometabolic: advanced CKD and diabetes, high risk for infection Devices therapies & Valvulopathies:not indicated; long term prognosis is also poor.  Advanced therapies:Not a candidate  2. Cardiac amyloid evaluation  - PYP scan is negative. LVH secondary to uncontrolled HTN  3. CKD IV - Due to long term diabetic nephropathy and uncontrolled hypertension   4. HTN - uncontrolled - increase hydralazine to 50mg  TID   Pegah Segel Advanced Heart Failure Mechanical Circulatory Support

## 2022-06-23 LAB — MULTIPLE MYELOMA PANEL, SERUM
Albumin SerPl Elph-Mcnc: 2.8 g/dL — ABNORMAL LOW (ref 2.9–4.4)
Albumin/Glob SerPl: 0.9 (ref 0.7–1.7)
Alpha 1: 0.3 g/dL (ref 0.0–0.4)
Alpha2 Glob SerPl Elph-Mcnc: 0.4 g/dL (ref 0.4–1.0)
B-Globulin SerPl Elph-Mcnc: 0.9 g/dL (ref 0.7–1.3)
Gamma Glob SerPl Elph-Mcnc: 1.9 g/dL — ABNORMAL HIGH (ref 0.4–1.8)
Globulin, Total: 3.4 g/dL (ref 2.2–3.9)
IgA: 540 mg/dL — ABNORMAL HIGH (ref 90–386)
IgG (Immunoglobin G), Serum: 1997 mg/dL — ABNORMAL HIGH (ref 603–1613)
IgM (Immunoglobulin M), Srm: 113 mg/dL (ref 20–172)
Total Protein ELP: 6.2 g/dL (ref 6.0–8.5)

## 2022-06-24 ENCOUNTER — Ambulatory Visit: Payer: Medicaid Other | Attending: Nurse Practitioner

## 2022-06-24 DIAGNOSIS — I5043 Acute on chronic combined systolic (congestive) and diastolic (congestive) heart failure: Secondary | ICD-10-CM

## 2022-06-25 LAB — BASIC METABOLIC PANEL
BUN/Creatinine Ratio: 13 (ref 9–20)
BUN: 58 mg/dL — ABNORMAL HIGH (ref 6–24)
CO2: 18 mmol/L — ABNORMAL LOW (ref 20–29)
Calcium: 8.1 mg/dL — ABNORMAL LOW (ref 8.7–10.2)
Chloride: 113 mmol/L — ABNORMAL HIGH (ref 96–106)
Creatinine, Ser: 4.41 mg/dL — ABNORMAL HIGH (ref 0.76–1.27)
Glucose: 85 mg/dL (ref 70–99)
Potassium: 4.7 mmol/L (ref 3.5–5.2)
Sodium: 145 mmol/L — ABNORMAL HIGH (ref 134–144)
eGFR: 16 mL/min/{1.73_m2} — ABNORMAL LOW (ref >59)

## 2022-06-29 ENCOUNTER — Ambulatory Visit (HOSPITAL_COMMUNITY)
Admission: RE | Admit: 2022-06-29 | Discharge: 2022-06-29 | Disposition: A | Discharge status: Home / Self Care | Payer: Medicaid Other | Source: Ambulatory Visit | Attending: Internal Medicine | Admitting: Internal Medicine

## 2022-06-29 DIAGNOSIS — I5042 Chronic combined systolic (congestive) and diastolic (congestive) heart failure: Secondary | ICD-10-CM | POA: Diagnosis not present

## 2022-06-29 LAB — BASIC METABOLIC PANEL
Anion gap: 6 (ref 5–15)
BUN: 56 mg/dL — ABNORMAL HIGH (ref 6–20)
CO2: 20 mmol/L — ABNORMAL LOW (ref 22–32)
Calcium: 8.1 mg/dL — ABNORMAL LOW (ref 8.9–10.3)
Chloride: 116 mmol/L — ABNORMAL HIGH (ref 98–111)
Creatinine, Ser: 4.37 mg/dL — ABNORMAL HIGH (ref 0.61–1.24)
GFR, Estimated: 16 mL/min — ABNORMAL LOW (ref >60)
Glucose, Bld: 89 mg/dL (ref 70–99)
Potassium: 4.5 mmol/L (ref 3.5–5.1)
Sodium: 142 mmol/L (ref 135–145)

## 2022-07-12 DIAGNOSIS — H35341 Macular cyst, hole, or pseudohole, right eye: Secondary | ICD-10-CM | POA: Diagnosis not present

## 2022-07-12 DIAGNOSIS — H35033 Hypertensive retinopathy, bilateral: Secondary | ICD-10-CM | POA: Diagnosis not present

## 2022-07-12 DIAGNOSIS — H3582 Retinal ischemia: Secondary | ICD-10-CM | POA: Diagnosis not present

## 2022-07-14 ENCOUNTER — Telehealth: Payer: Self-pay | Admitting: Pharmacist

## 2022-07-14 ENCOUNTER — Encounter: Payer: Self-pay | Admitting: *Deleted

## 2022-07-14 ENCOUNTER — Other Ambulatory Visit: Payer: Medicaid Other | Admitting: *Deleted

## 2022-07-14 DIAGNOSIS — I5040 Unspecified combined systolic (congestive) and diastolic (congestive) heart failure: Secondary | ICD-10-CM

## 2022-07-14 NOTE — Telephone Encounter (Signed)
Received message from RN CM that patient would like to speak with a pharmacist regarding his medications.   Outreached patient. Scheduled phone call for next available in 2 weeks.   Discussed MyChart, however, patient is visually impaired and cannot see the app.   Catie Hedwig Morton, PharmD, [place], [facility] [phone]

## 2022-07-14 NOTE — Patient Instructions (Signed)
Visit Information  Todd Mccoy was given information about Medicaid Managed Care team care coordination services as a part of their [facility] Medicaid benefit. Todd Mccoy verbally consented to engagement with the [facility] Managed Care team.   If you are experiencing a medical emergency, please call 911 or report to your local emergency department or urgent care.   If you have a non-emergency medical problem during routine business hours, please contact your provider's office and ask to speak with a nurse.   For questions related to your [facility] health plan, please call: [phone] or go here:[email or web address]  If you would like to schedule transportation through your [facility] plan, please call the following number at least 2 days in advance of your appointment: [phone].  You can also use the MTM portal or MTM mobile app to manage your rides. For the portal, please go to [email or web address].  Call the [place] at [phone], at any time, 24 hours a day, 7 days a week. If you are in danger or need immediate medical attention call 911.  If you would like help to quit smoking, call 1-800-QUIT-NOW (775)482-1853) OR Espaol: 1-855-Djelo-Ya (2-482-500-3704) o para ms informacin haga clic aqu or Text READY to 200-400 to register via text  Todd Mccoy,   Please see education materials related to diarrhea provided as print materials.   The patient verbalized understanding of instructions, educational materials, and care plan provided today and agreed to receive a mailed copy of patient instructions, educational materials, and care plan.   Telephone follow up appointment with Managed Medicaid care management team member scheduled for:08/17/22 @ [place] RN, [name] [place] RN Care Coordinator   Following is a copy of your plan of care:  Care Plan : RN Care Manager Plan of Care  Updates  made by Melissa Montane, RN since 07/14/2022 12:00 AM     Problem: Chronic Disease Management and Care Coordination Needs for CHF, HTN, DM   Priority: High     Long-Range Goal: Development of Plan of Care for Chronic Disease Management and Care Coordination Needs (CHF, HTN, DM)   Start Date: 09/30/2021  Expected End Date: 09/02/2022  Priority: High  Note:   Current Barriers:  Knowledge Deficits related to plan of care for management of CHF, HTN, and DMII  Chronic Disease Management support and education needs related to CHF, HTN, and DMII Difficulty obtaining medications Recent Vision 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  RNCM Clinical Goal(s):  Patient will verbalize understanding of plan for management of CHF, HTN, and DMII as evidenced by improvement in management of these chronic diseases. verbalize basic understanding of CHF, HTN, and DMII disease process and self health management plan as evidenced by improved control of factors that cause CHF, improved blood pressure readings < 140/90 and improved control of blood sugar readings with range with 80 - 120. take all medications exactly as prescribed and will call provider for medication related questions as evidenced by compliance with medications    attend all scheduled medical appointments: 07/21/22 with Nephrology, 08/02/22 with HF Clinic, 08/30/22 with PCP and 09/21/22 with Cardiology as evidenced by attending all scheduled appointments        demonstrate improved adherence to prescribed treatment plan for CHF, HTN, and DMII as evidenced by better control of blood pressure, blood sugars and factors impacting CHF. continue to work with Consulting civil engineer and/or Education officer, museum  to address care management and care coordination needs related to CHF, HTN, and DMII as evidenced by adherence to CM Team Scheduled appointments     through collaboration with RN Care manager, provider, and care team.    Interventions: Inter-disciplinary care team collaboration (see longitudinal plan of care) Evaluation of current treatment plan related to  self management and patient's adherence to plan as established by provider Collaborated with MM Pharmacist to assist patient with medication related questions(recurring diarrhea and compliance packaging) Advised patient to schedule an earlier visit with PCP to discuss recurring diarrhea  Heart Failure Interventions:  (Status: Goal on Track (progressing): YES.)  Long Term Goal  Wt Readings from Last 3 Encounters:  02/09/22 190 lb (86.2 kg)  01/26/22 211 lb 3.2 oz (95.8 kg)  01/09/22 190 lb (86.2 kg)    Discussed importance of daily weight and advised patient to weigh and record daily Reviewed role of diuretics in prevention of fluid overload and management of heart failure Discussed the importance of keeping all appointments with provider Assessed social determinant of health barriers Reviewed medications, discussed missed lab appointment Advised patient to take all medications and BP readings to all upcoming appointments Reviewed upcoming appointments including 07/21/22 with Nephrology, 08/02/22 with HF Clinic, 08/30/22 with PCP and 09/21/22 with Cardiology Reviewed provider notes from HF Clinic and Cardiology and discussed Verified patient has scale to use at home, advised patient to check weight daily and contact provider if weight gain of 2-3 pounds over night or 5 pounds in a week   Hypertension: (Status: Goal on Track (progressing): YES.) Long Term Goal Patient is taking all medications, cutting back on salt and checking BP daily Last practice recorded BP readings:  BP Readings from Last 3 Encounters:  06/21/22 (!) 150/80  06/17/22 138/88  05/18/22 128/86  Most recent eGFR/CrCl:  Lab Results  Component Value Date   EGFR 16 (L) 06/24/2022    No components found for: "CRCL"  Reviewed medications with patient and discussed importance of  compliance;  Advised patient, providing education and rationale, to monitor blood pressure daily and record, calling PCP for findings outside established parameters;  Reviewed scheduled/upcoming provider appointments including: 07/21/22 with Nephrology, 08/02/22 with HF Clinic, 08/30/22 with PCP and 09/21/22 with Cardiology Discussed the importance of checking BP at home, patient reports good BP at home, unable to provide reading   Patient Goals/Self-Care Activities: Take medications as prescribed   Attend all scheduled provider appointments Call pharmacy for medication refills 3-7 days in advance of running out of medications Call provider office for new concerns or questions  check blood sugar at prescribed times: once daily check blood pressure daily limit salt intake to 2000 mg/day

## 2022-07-14 NOTE — Patient Outreach (Signed)
Medicaid Managed Care   Nurse Care Manager Note  07/14/2022 Name:  Todd Mccoy MRN:  [id number] DOB:  [birth year]  Todd Mccoy is an 52 y.o. year old male who is a primary patient of [name], MD.  The [facility] Managed Care Coordination team was consulted for assistance with:    CHF HTN  Todd Mccoy was given information about Medicaid Managed Care Coordination team services today. Todd Mccoy Patient agreed to services and verbal consent obtained.  Engaged with patient by telephone for follow up visit in response to provider referral for case management and/or care coordination services.   Assessments/Interventions:  Review of past medical history, allergies, medications, health status, including review of consultants reports, laboratory and other test data, was performed as part of comprehensive evaluation and provision of chronic care management services.  SDOH (Social Determinants of Health) assessments and interventions performed: SDOH Interventions    Flowsheet Row Patient Outreach Telephone from 07/14/2022 in [place] Patient Outreach Telephone from 06/14/2022 in [place] Patient Outreach Telephone from 03/22/2022 in [place] Patient Outreach Telephone from 09/30/2021 in [place] from 02/19/2021 in [place]  SDOH Interventions       Food Insecurity Interventions Intervention Not Indicated -- -- Intervention Not Indicated --  Housing Interventions Intervention Not Indicated -- Intervention Not Indicated Intervention Not Indicated Intervention Not Indicated  Transportation Interventions Intervention Not Indicated Intervention Not Indicated Intervention Not Indicated Intervention Not Indicated Intervention Not Indicated   Utilities Interventions Intervention Not Indicated -- -- -- --  Financial Strain Interventions -- -- -- Intervention Not Indicated --  Physical Activity Interventions -- -- -- Intervention Not Indicated --  Stress Interventions -- -- -- Intervention Not Indicated --  Social Connections Interventions -- -- -- Intervention Not Indicated --       Care Plan  No Known Allergies  Medications Reviewed Today     Reviewed by Melissa Montane, RN (Registered Nurse) on 07/14/22 at [phone]  Med List Status: <None>   Medication Order Taking? Sig Documenting Provider Last Dose Status Informant  amLODipine (NORVASC) 10 MG tablet [id number] No Take 1 tablet (10 mg total) by mouth daily. Todd Margarita, MD Taking Active   atorvastatin (LIPITOR) 20 MG tablet [id number] No Take 1 tablet (20 mg total) by mouth daily. Todd Margarita, MD Taking Active   Blood Pressure Monitoring (OMRON 3 SERIES BP MONITOR) DEVI [id number] No 1 each by Does not apply route daily. Todd Margarita, MD Taking Active   carvedilol (COREG) 25 MG tablet [id number] No Take 1.5 tablets (37.5 mg total) by mouth 2 (two) times daily. Todd Lund., NP Taking Active   ferrous sulfate 325 (65 FE) MG EC tablet [id number] No Take 325 mg by mouth daily with breakfast. [provider] Taking Active   hydrALAZINE (APRESOLINE) 50 MG tablet [id number]  Take 1 tablet (50 mg total) by mouth 3 (three) times daily. [name], [name], DO  Active   hydrOXYzine (ATARAX) 25 MG tablet [id number] No Take 1 tablet (25 mg total) by mouth at bedtime as needed. [name], MD Taking Active   ibuprofen (ADVIL) 600 MG tablet [id number] No Take 1 tablet (600 mg total) by mouth every 6 (six) hours as needed. Todd Moras, PA-C Taking Active     Discontinued 05/13/20 1120   insulin glargine (  LANTUS) 100 UNIT/ML Solostar Pen [id number] No Inject 10 Units into the skin at bedtime. [name], MD Taking Active   isosorbide mononitrate (IMDUR) 60 MG 24 hr  tablet [id number] No Take 1 tablet (60 mg total) by mouth daily. Todd Margarita, MD Taking Active   Discontinued 12/22/20 1205 (Stop Taking at Discharge)   Naphazoline HCl (CLEAR EYES OP) [id number] No Place 1 drop into both eyes as needed (irritation). [provider] Taking Active Self  pregabalin (LYRICA) 50 MG capsule [id number] No Take 1 capsule (50 mg total) by mouth 2 (two) times daily. [name], MD Taking Active   torsemide (DEMADEX) 20 MG tablet [id number] No Take 1 tablet (20 mg total) by mouth 2 (two) times daily. Todd Lund., NP Taking Active             Patient Active Problem List   Diagnosis Date Noted   Renal insufficiency    Acute on chronic systolic CHF (congestive heart failure) (Todd Mccoy) 01/23/2022   Prolonged QT interval 01/23/2022   Chest pain 01/23/2022   Elevated troponin 01/23/2022   Anemia due to chronic kidney disease 01/23/2022   Hypertensive emergency 01/23/2022   Hyponatremia 01/23/2022   Chronic combined systolic (congestive) and diastolic (congestive) heart failure (Todd Mccoy) 04/07/2021   Chronic renal disease, stage 4, severely decreased glomerular filtration rate between 15-29 mL/min/1.73 square meter (HCC)    Hypertensive urgency    Acute kidney injury superimposed on chronic kidney disease (Todd Mccoy)    Acute CHF (congestive heart failure) (Todd Mccoy) 02/27/2021   Normocytic anemia 02/27/2021   Anasarca 02/27/2021   Abscess of left foot 12/10/2020   S/P BKA (below knee amputation) unilateral, left ([place]) 12/10/2020   Dyslipidemia 12/10/2020   CKD (chronic kidney disease), stage III (Todd Mccoy) 12/10/2020   HTN (hypertension) 11/12/2020   Cellulitis and abscess of foot    Diabetes mellitus with hyperglycemia ([name]) 09/07/2014   Tobacco abuse 09/07/2014   Abscess of back    Abscess of lower back 09/06/2014   DM2 (diabetes mellitus, type 2) (Todd Mccoy) 09/06/2014   Sepsis ([name]) 09/06/2014   Scrotal abscess 06/20/2014    Conditions to be  addressed/monitored per PCP order:  CHF and HTN  Care Plan : RN Care Manager Plan of Care  Updates made by Melissa Montane, RN since 07/14/2022 12:00 AM     Problem: Chronic Disease Management and Care Coordination Needs for CHF, HTN, DM   Priority: High     Long-Range Goal: Development of Plan of Care for Chronic Disease Management and Care Coordination Needs (CHF, HTN, DM)   Start Date: 09/30/2021  Expected End Date: 09/02/2022  Priority: High  Note:   Current Barriers:  Knowledge Deficits related to plan of care for management of CHF, HTN, and DMII  Chronic Disease Management support and education needs related to CHF, HTN, and DMII Difficulty obtaining medications Recent Vision 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  RNCM Clinical Goal(s):  Patient will verbalize understanding of plan for management of CHF, HTN, and DMII as evidenced by improvement in management of these chronic diseases. verbalize basic understanding of CHF, HTN, and DMII disease process and self health management plan as evidenced by improved control of factors that cause CHF, improved blood pressure readings < 140/90 and improved control of blood sugar readings with range with 80 - 120. take all medications exactly as prescribed and will call provider for medication related questions as evidenced by compliance with  medications    attend all scheduled medical appointments: 07/21/22 with Nephrology, 08/02/22 with HF Clinic, 08/30/22 with PCP and 09/21/22 with Cardiology as evidenced by attending all scheduled appointments        demonstrate improved adherence to prescribed treatment plan for CHF, HTN, and DMII as evidenced by better control of blood pressure, blood sugars and factors impacting CHF. continue to work with Consulting civil engineer and/or Social Worker to address care management and care coordination needs related to CHF, HTN, and DMII as evidenced by adherence to CM Team  Scheduled appointments     through collaboration with RN Care manager, provider, and care team.   Interventions: Inter-disciplinary care team collaboration (see longitudinal plan of care) Evaluation of current treatment plan related to  self management and patient's adherence to plan as established by provider Collaborated with MM Pharmacist to assist patient with medication related questions(recurring diarrhea and compliance packaging) Advised patient to schedule an earlier visit with PCP to discuss recurring diarrhea  Heart Failure Interventions:  (Status: Goal on Track (progressing): YES.)  Long Term Goal  Wt Readings from Last 3 Encounters:  02/09/22 190 lb (86.2 kg)  01/26/22 211 lb 3.2 oz (95.8 kg)  01/09/22 190 lb (86.2 kg)    Discussed importance of daily weight and advised patient to weigh and record daily Reviewed role of diuretics in prevention of fluid overload and management of heart failure Discussed the importance of keeping all appointments with provider Assessed social determinant of health barriers Reviewed medications, discussed missed lab appointment Advised patient to take all medications and BP readings to all upcoming appointments Reviewed upcoming appointments including 07/21/22 with Nephrology, 08/02/22 with HF Clinic, 08/30/22 with PCP and 09/21/22 with Cardiology Reviewed provider notes from [facility] and Cardiology and discussed Verified patient has scale to use at home, advised patient to check weight daily and contact provider if weight gain of 2-3 pounds over night or 5 pounds in a week   Hypertension: (Status: Goal on Track (progressing): YES.) Long Term Goal Patient is taking all medications, cutting back on salt and checking BP daily Last practice recorded BP readings:  BP Readings from Last 3 Encounters:  06/21/22 (!) 150/80  06/17/22 138/88  05/18/22 128/86  Most recent eGFR/CrCl:  Lab Results  Component Value Date   EGFR 16 (L) 06/24/2022    No  components found for: "CRCL"  Reviewed medications with patient and discussed importance of compliance;  Advised patient, providing education and rationale, to monitor blood pressure daily and record, calling PCP for findings outside established parameters;  Reviewed scheduled/upcoming provider appointments including: 07/21/22 with Nephrology, 08/02/22 with HF Clinic, 08/30/22 with PCP and 09/21/22 with Cardiology Discussed the importance of checking BP at home, patient reports good BP at home, unable to provide reading   Patient Goals/Self-Care Activities: Take medications as prescribed   Attend all scheduled provider appointments Call pharmacy for medication refills 3-7 days in advance of running out of medications Call provider office for new concerns or questions  check blood sugar at prescribed times: once daily check blood pressure daily limit salt intake to 2000 mg/day       Follow Up:  Patient agrees to Care Plan and Follow-up.  Plan: The Managed Medicaid care management team will reach out to the patient again over the next 30 days.  Date/time of next scheduled RN care management/care coordination outreach:  08/17/22 @ [place] RN, [name] [place]  [employer] manager

## 2022-07-29 ENCOUNTER — Other Ambulatory Visit: Payer: Medicaid Other | Admitting: Pharmacist

## 2022-07-29 NOTE — Progress Notes (Signed)
Care Coordination Call  Contacted patient for scheduled phone appointment; however, nobody was home to read pill bottles to me. His niece would be there around ~ 10 am; scheduled call for next Friday at that time.   Patient notes his biggest concern is diarrhea. Will review medications next week when his niece is available.   Catie Hedwig Morton, PharmD, [place], [facility] [phone]

## 2022-08-02 ENCOUNTER — Encounter (HOSPITAL_COMMUNITY): Payer: Medicaid Other | Admitting: Cardiology

## 2022-08-05 DIAGNOSIS — E785 Hyperlipidemia, unspecified: Secondary | ICD-10-CM | POA: Diagnosis not present

## 2022-08-05 DIAGNOSIS — I129 Hypertensive chronic kidney disease with stage 1 through stage 4 chronic kidney disease, or unspecified chronic kidney disease: Secondary | ICD-10-CM | POA: Diagnosis not present

## 2022-08-05 DIAGNOSIS — I5042 Chronic combined systolic (congestive) and diastolic (congestive) heart failure: Secondary | ICD-10-CM | POA: Diagnosis not present

## 2022-08-05 DIAGNOSIS — E1122 Type 2 diabetes mellitus with diabetic chronic kidney disease: Secondary | ICD-10-CM | POA: Diagnosis not present

## 2022-08-05 DIAGNOSIS — N184 Chronic kidney disease, stage 4 (severe): Secondary | ICD-10-CM | POA: Diagnosis not present

## 2022-08-05 NOTE — Progress Notes (Signed)
08/06/2022 Name: Todd Mccoy MRN: [id number] DOB: Apr 02, 1971    Todd Mccoy is a 52 y.o. year old male who presented for a telephone visit.   They were referred to the pharmacist by their Case Management Team  for assistance in managing  medication questions . Particular concern of diarrhea.  His niece provides a medication review by assisting with reading pill bottles. Of note, amlodipine bottle empty, and atorvastatin bottle is low.  Patient is participating in a Managed Medicaid Plan:  Yes  Subjective:  Care Team: Primary Care Provider: [name], MD ; Next Scheduled Visit: 08/19/22  Medication Access/Adherence  Current Pharmacy:  [facility] ([place] [place] - [address] [place] [address] Phone: [phone] Fax: [place], [place] - [address] [place] [address] Phone: [phone] Fax: [phone]   Patient reports affordability concerns with their medications: No  Patient reports access/transportation concerns to their pharmacy: No  Patient reports adherence concerns with their medications:  No  , pill box    Diabetes:  Current medications: lantus 10 units at bedtime  Current glucose readings: 110-150s Using traditional meter; testing one time daily  Patient denies hypoglycemic s/sx including dizziness, shakiness, sweating. Patient denies hyperglycemic symptoms including polyuria, polydipsia, polyphagia, nocturia, neuropathy, blurred vision.  Current meal patterns: to be discussed at future visits  Current physical activity: to be discussed at future visits  Current medication access support: none at present  Hypertension:  Current medications: carvedilol 37.5mg  BID, hydralazine 50mg  TID, imdur 60mg  daily, torsemide 20mg  daily, amlodipine 10mg  daily Medications previously tried:   Patient has a  validated, automated, upper arm home BP cuff Current blood pressure readings readings: 150/100s, checks twice weekly AFTER smoking and coffee  Patient denies hypotensive s/sx including dizziness, lightheadedness.  Patient denies hypertensive symptoms including headache, chest pain, shortness of breath  Medication Management:  Current adherence strategy: niece helps by putting medicine into pill bottles  Patient reports Fair adherence to medications  Patient reports the following barriers to adherence: interested in adherence packaging  Recent fill dates: he is behind on amlodipine & atorvastatin   Objective:  Lab Results  Component Value Date   HGBA1C 6.9 02/24/2022    Lab Results  Component Value Date   CREATININE 4.37 (H) 06/29/2022   BUN 56 (H) 06/29/2022   NA 142 06/29/2022   K 4.5 06/29/2022   CL 116 (H) 06/29/2022   CO2 20 (L) 06/29/2022    Lab Results  Component Value Date   CHOL 82 (L) 05/21/2021   HDL 27 (L) 05/21/2021   LDLCALC 40 05/21/2021   LDLDIRECT 36 05/21/2021   TRIG 67 05/21/2021   CHOLHDL 3.0 05/21/2021    Medications Reviewed Today     Reviewed by Darius Bump, RPH (Pharmacist) on 08/06/22 at [phone]  Med List Status: <None>   Medication Order Taking? Sig Documenting Provider Last Dose Status Informant  amLODipine (NORVASC) 10 MG tablet [id number] No Take 1 tablet (10 mg total) by mouth daily. [name], MD Unknown Active   atorvastatin (LIPITOR) 20 MG tablet [id number] Yes Take 1 tablet (20 mg total) by mouth daily. [name], MD Taking Active   Blood Pressure Monitoring (OMRON 3 SERIES BP MONITOR) DEVI [id number] Yes 1 each by Does not apply route daily. [name], MD Taking Active   carvedilol (COREG) 25 MG tablet [id number] Yes Take  1.5 tablets (37.5 mg total) by mouth 2 (two) times daily. [name], NP Taking Active   ferrous sulfate 325 (65 FE) MG EC tablet [id number] Yes Take 325 mg by mouth daily with breakfast.  [provider] Taking Active   hydrALAZINE (APRESOLINE) 50 MG tablet [id number] Yes Take 1 tablet (50 mg total) by mouth 3 (three) times daily. [name], DO Taking Active   hydrOXYzine (ATARAX) 25 MG tablet [id number] Yes Take 1 tablet (25 mg total) by mouth at bedtime as needed. [name], MD Taking Active   ibuprofen (ADVIL) 600 MG tablet [id number] No Take 1 tablet (600 mg total) by mouth every 6 (six) hours as needed. [name], PA-C Unknown Active     Discontinued 05/13/20 1120   insulin glargine (LANTUS) 100 UNIT/ML Solostar Pen [id number] Yes Inject 10 Units into the skin at bedtime. [name], MD Taking Active   isosorbide mononitrate (IMDUR) 60 MG 24 hr tablet [id number] Yes Take 1 tablet (60 mg total) by mouth daily. [name], MD Taking Active     Discontinued 12/22/20 1205 (Stop Taking at Discharge)   Naphazoline HCl (CLEAR EYES OP) [id number] Yes Place 1 drop into both eyes as needed (irritation). [provider] Taking Active Self  pregabalin (LYRICA) 50 MG capsule [id number] Yes Take 1 capsule (50 mg total) by mouth 2 (two) times daily. [name], MD Taking Active   torsemide (DEMADEX) 20 MG tablet [id number] Yes Take 1 tablet (20 mg total) by mouth 2 (two) times daily. [name], NP Taking Active               Assessment/Plan:   Diabetes: - Currently controlled - Reviewed goal A1c, goal fasting, and goal 2 hour post prandial glucose - Recommend to continue current regimen  - Recommend to check glucose daily  Hypertension: - Currently uncontrolled - Reviewed long term cardiovascular and renal outcomes of uncontrolled blood pressure - Reviewed appropriate blood pressure monitoring technique and reviewed goal blood pressure. Recommended to check home blood pressure and heart rate twice weekly, but reinforced proper technique and encouraged patient to check BEFORE smoking or coffee - Recommend to refill amlodipine,  patient verbalizes understanding and agrees to plan    Medication Management: - Currently strategy insufficient to maintain appropriate adherence to prescribed medication regimen -  Reinforced use of weekly pill box to organize medications - Discussed collaboration with local pharmacies for adherence packaging. Reviewed local pharmacies with adherence packaging options. Patient elects to utilize central pharmacy services when adherence packing is in place. - Reviewed medication list for diarrhea in response to patient question, no high-offending medications at present. Discussed with patient, who verbalized understanding.   Follow Up Plan: 3-4 weeks to review blood pressure after restarting amlodipine and implementing BP monitoring techniques.  Larinda Buttery, PharmD Clinical Pharmacist [facility] [phone]

## 2022-08-06 ENCOUNTER — Other Ambulatory Visit (HOSPITAL_BASED_OUTPATIENT_CLINIC_OR_DEPARTMENT_OTHER): Payer: Medicaid Other | Admitting: Pharmacist

## 2022-08-06 DIAGNOSIS — I129 Hypertensive chronic kidney disease with stage 1 through stage 4 chronic kidney disease, or unspecified chronic kidney disease: Secondary | ICD-10-CM

## 2022-08-06 DIAGNOSIS — N184 Chronic kidney disease, stage 4 (severe): Secondary | ICD-10-CM

## 2022-08-06 DIAGNOSIS — I10 Essential (primary) hypertension: Secondary | ICD-10-CM

## 2022-08-06 DIAGNOSIS — E1122 Type 2 diabetes mellitus with diabetic chronic kidney disease: Secondary | ICD-10-CM

## 2022-08-13 ENCOUNTER — Telehealth: Payer: Self-pay | Admitting: Hematology and Oncology

## 2022-08-13 NOTE — Telephone Encounter (Signed)
Scheduled appt per 2/9 referral. Pt is aware of appt date and time. Pt is aware to arrive 15 mins prior to appt time and to bring and updated insurance card. Pt is aware of appt location.

## 2022-08-17 ENCOUNTER — Other Ambulatory Visit: Payer: Medicaid Other | Admitting: *Deleted

## 2022-08-17 NOTE — Patient Instructions (Signed)
Visit Information  Mr. LEWIE MIGNANO  - as a part of your Medicaid benefit, you are eligible for care management and care coordination services at no cost or copay. I was unable to reach you by phone today but would be happy to help you with your health related needs. Please feel free to call me @ [phone].   A member of the Managed Medicaid care management team will reach out to you again over the next 7 days.   Lurena Joiner RN, [name] [place]  [employer] manager

## 2022-08-17 NOTE — Patient Outreach (Signed)
  Medicaid Managed Care   Unsuccessful Attempt Note   08/17/2022 Name: Todd Mccoy MRN: [id number] DOB: 02-06-1971  Referred by: [name], MD Reason for referral : High Risk Managed Medicaid (Unsuccessful RNCM follow up outreach)   An unsuccessful telephone outreach was attempted today. The patient was referred to the case management team for assistance with care management and care coordination.    Follow Up Plan: A HIPAA compliant phone message was left for the patient providing contact information and requesting a return call.    Lurena Joiner RN, [name] [place]  [employer] manager

## 2022-08-19 ENCOUNTER — Other Ambulatory Visit: Payer: Self-pay | Admitting: Physician Assistant

## 2022-08-19 ENCOUNTER — Ambulatory Visit: Payer: Medicaid Other | Admitting: Family Medicine

## 2022-08-19 DIAGNOSIS — E1122 Type 2 diabetes mellitus with diabetic chronic kidney disease: Secondary | ICD-10-CM

## 2022-08-19 NOTE — Telephone Encounter (Signed)
Patient of Dr. Radford Pax. Please review for refill. Thank you!

## 2022-08-24 ENCOUNTER — Other Ambulatory Visit: Payer: Medicaid Other | Admitting: Pharmacist

## 2022-08-24 NOTE — Progress Notes (Signed)
08/24/2022 Name: Todd Mccoy MRN: [id number] DOB: 18-Oct-1970    Todd Mccoy is a 52 y.o. year old male who presented for a telephone visit.   They were referred to the pharmacist by their Case Management Team  for assistance in managing diabetes.  His niece provides a medication review by assisting with reading pill bottles, patient is legally blind.  Patient is participating in a Managed Medicaid Plan:  Yes  Subjective:  Care Team: Primary Care Provider: [name], MD ; Next Scheduled Visit: 08/19/22  Medication Access/Adherence  Current Pharmacy:  [facility] ([place] - [address] [place] [address] Phone: [phone] Fax: [place], [place] - [address] [place] [address] Phone: [phone] Fax: [phone]   Patient reports affordability concerns with their medications: No  Patient reports access/transportation concerns to their pharmacy: No  Patient reports adherence concerns with their medications:  No  , pill box    Diabetes:  Current medications: lantus 4-5 units at bedtime (prescribed 10 units), inconsistent usage lately due to symptoms of "can't breathe, hallucinations, messing with his mind" when he lays down. These symptoms do not occur when he skips insulin, so patient is concerned his blood sugar is going too low.  Current glucose readings: 110-150s Using traditional meter; testing one time daily in evening prior to administering insulin.  Patient reports hypoglycemic s/sx including some dizziness, can't breath, hallucinating through the night  Patient denies hyperglycemic symptoms including polyuria, polydipsia, polyphagia, nocturia, neuropathy, blurred vision.  Current meal patterns:  Breakfast: eggs, grits Lunch: sometimes skips, but small snack occasionally, fruit Supper: chicken,  beef stew  Current physical activity: limited with unilateral BKA, wheelchair bound  Current medication access support: none at present  Hypertension:  Current medications: carvedilol 37.61m BID, hydralazine 54mTID, imdur 6074maily, torsemide 31m53mily, amlodipine 10mg62mly Medications previously tried:   Patient has a validated, automated, upper arm home BP cuff Current blood pressure readings readings: 150/100s, checks twice weekly AFTER smoking and coffee  Patient denies hypotensive s/sx including dizziness, lightheadedness.  Patient denies hypertensive symptoms including headache, chest pain, shortness of breath  Medication Management:  Current adherence strategy: niece helps by putting medicine into pill bottles  Patient reports Fair adherence to medications  Patient reports the following barriers to adherence: interested in adherence packaging   Objective:  Lab Results  Component Value Date   HGBA1C 6.9 02/24/2022    Lab Results  Component Value Date   CREATININE 4.37 (H) 06/29/2022   BUN 56 (H) 06/29/2022   NA 142 06/29/2022   K 4.5 06/29/2022   CL 116 (H) 06/29/2022   CO2 20 (L) 06/29/2022    Lab Results  Component Value Date   CHOL 82 (L) 05/21/2021   HDL 27 (L) 05/21/2021   LDLCALC 40 05/21/2021   LDLDIRECT 36 05/21/2021   TRIG 67 05/21/2021   CHOLHDL 3.0 05/21/2021    Medications Reviewed Today     Reviewed by Todd Mccoy (Pharmacist) on 08/24/22 at 1637  Med List Status: <None>   Medication Order Taking? Sig Documenting Provider Last Dose Status Informant  amLODipine (NORVASC) 10 MG tablet [id number] 1 tablet (10 mg total) by mouth daily. Todd MargaritaTaking Active   atorvastatin (LIPITOR) 20 MG tablet [id number] 1 tablet (20 mg total) by mouth daily. Todd Mccoy  Active   Blood Pressure Monitoring (OMRON 3 SERIES BP MONITOR) DEVI [id number]  1 each by Does not apply route daily. Todd Margarita, MD   Active   carvedilol (COREG) 25 MG tablet [id number]  Take 1.5 tablets (37.5 mg total) by mouth 2 (two) times daily. Todd Mccoy  Active   ferrous sulfate 325 (65 FE) MG EC tablet [id number]  Take 325 mg by mouth daily with breakfast. [provider]  Active   hydrALAZINE (APRESOLINE) 50 MG tablet [id number]  Take 1 tablet (50 mg total) by mouth 3 (three) times daily. Mccoy, [name], DO  Active   hydrOXYzine (ATARAX) 25 MG tablet [id number]  Take 1 tablet (25 mg total) by mouth at bedtime as needed. Todd Rakes, MD  Active   ibuprofen (ADVIL) 600 MG tablet [id number]  Take 1 tablet (600 mg total) by mouth every 6 (six) hours as needed. Todd Moras, PA-C  Active     Discontinued 05/13/20 1120   insulin glargine (LANTUS) 100 UNIT/ML Solostar Pen [id number] No Inject 10 Units into the skin at bedtime.  Patient not taking: Reported on 08/24/2022   Todd Rakes, MD Not Taking Active   isosorbide mononitrate (IMDUR) 60 MG 24 hr tablet [id number]  TAKE ONE TABLET BY MOUTH ONCE DAILY Todd Margarita, MD  Active     Discontinued 12/22/20 1205 (Stop Taking at Discharge)   Naphazoline HCl (CLEAR EYES OP) [id number]  Place 1 drop into both eyes as needed (irritation). [provider]  Active Self  pregabalin (LYRICA) 50 MG capsule [id number]  Take 1 capsule (50 mg total) by mouth 2 (two) times daily. Todd Rakes, MD  Active   torsemide (DEMADEX) 20 MG tablet [id number]  Take 1 tablet (20 mg total) by mouth 2 (two) times daily. Todd Mccoy  Active               Assessment/Plan:   Diabetes: - Currently controlled,  recommend temporarily lower lantus dosing to 4-6 units nightly, on a CONSISTENT basis instead of sporadic usage. - Reviewed goal A1c, goal fasting, and goal 2 hour post prandial glucose - Recommend to continue current regimen  - Recommend to check glucose first thing in the morning, in addition to his bedtime checks. We will discuss these numbers  to investigate patient concerns of hypoglycemia.  Hypertension: - Currently uncontrolled - Reviewed long term cardiovascular and renal outcomes of uncontrolled blood pressure - Reviewed appropriate blood pressure monitoring technique and reviewed goal blood pressure. Recommended to check home blood pressure and heart rate twice weekly, but reinforced proper technique and encouraged patient to check BEFORE smoking or coffee - Recommend to refill amlodipine, patient verbalizes understanding and agrees to plan    Medication Management: - Currently strategy insufficient to maintain appropriate adherence to prescribed medication regimen -  Reinforced use of weekly pill box to organize medications - Discussed collaboration with local pharmacies for adherence packaging. Reviewed local pharmacies with adherence packaging options. Patient elects to utilize central pharmacy services when adherence packing is in place. - Reviewed medication list for diarrhea in response to patient question, no high-offending medications at present. Discussed with patient, who verbalized understanding.   Follow Up Plan: 2 weeks to review blood pressure after restarting amlodipine and implementing BP monitoring techniques, as well as review fasting BG values  Larinda Buttery, PharmD Clinical Pharmacist [facility] [phone]

## 2022-08-26 ENCOUNTER — Other Ambulatory Visit: Payer: Medicaid Other | Admitting: *Deleted

## 2022-08-26 ENCOUNTER — Encounter: Payer: Self-pay | Admitting: *Deleted

## 2022-08-26 NOTE — Patient Instructions (Signed)
Visit Information  Todd Mccoy was given information about Medicaid Managed Care team care coordination services as a part of their [employer] Medicaid benefit. Todd Mccoy verbally consented to engagement with the [facility] Managed Care team.   If you are experiencing a medical emergency, please call 911 or report to your local emergency department or urgent care.   If you have a non-emergency medical problem during routine business hours, please contact your provider's office and ask to speak with a nurse.   For questions related to your [facility] health plan, please call: [phone] or go here:[email or web address]  If you would like to schedule transportation through your [facility] plan, please call the following number at least 2 days in advance of your appointment: [phone].  You can also use the MTM portal or MTM mobile app to manage your rides. For the portal, please go to [email or web address].  Call the [place] at [phone], at any time, 24 hours a day, 7 days a week. If you are in danger or need immediate medical attention call 911.  If you would like help to quit smoking, call 1-800-QUIT-NOW (551)707-1777) OR Espaol: 1-855-Djelo-Ya QO:409462) o para ms informacin haga clic aqu or Text READY to 200-400 to register via text  Todd Mccoy,   Please see education materials related to HTN provided as print materials.   The patient verbalized understanding of instructions, educational materials, and care plan provided today and agreed to receive a mailed copy of patient instructions, educational materials, and care plan.   Telephone follow up appointment with Managed Medicaid care management team member scheduled for:09/29/22 @ [place] RN, [name] [place] RN Care Coordinator   Following is a copy of your plan of care:  Care Plan : RN Care Manager Plan of Care  Updates made by  Melissa Montane, RN since 08/26/2022 12:00 AM     Problem: Chronic Disease Management and Care Coordination Needs for CHF, HTN, DM   Priority: High     Long-Range Goal: Development of Plan of Care for Chronic Disease Management and Care Coordination Needs (CHF, HTN, DM)   Start Date: 09/30/2021  Expected End Date: 11/02/2022  Priority: High  Note:   Current Barriers:  Knowledge Deficits related to plan of care for management of CHF, HTN, and DMII  Chronic Disease Management support and education needs related to CHF, HTN, and DMII Difficulty obtaining medications Recent Vision 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  RNCM Clinical Goal(s):  Patient will verbalize understanding of plan for management of CHF, HTN, and DMII as evidenced by improvement in management of these chronic diseases. verbalize basic understanding of CHF, HTN, and DMII disease process and self health management plan as evidenced by improved control of factors that cause CHF, improved blood pressure readings < 140/90 and improved control of blood sugar readings with range with 80 - 120. take all medications exactly as prescribed and will call provider for medication related questions as evidenced by compliance with medications    attend all scheduled medical appointments:  08/30/22 with Oncology and 09/21/22 with Cardiology as evidenced by attending all scheduled appointments        demonstrate improved adherence to prescribed treatment plan for CHF, HTN, and DMII as evidenced by better control of blood pressure, blood sugars and factors impacting CHF. continue to work with Consulting civil engineer and/or Social Worker to address care management and care  coordination needs related to CHF, HTN, and DMII as evidenced by adherence to CM Team Scheduled appointments     through collaboration with RN Care manager, provider, and care team.   Interventions: Inter-disciplinary care team collaboration  (see longitudinal plan of care) Evaluation of current treatment plan related to  self management and patient's adherence to plan as established by provider Advised patient to reschedule missed visits with PCP and HF Clinic  Heart Failure Interventions:  (Status: Goal on Track (progressing): YES.)  Long Term Goal  Wt Readings from Last 3 Encounters:  02/09/22 190 lb (86.2 kg)  01/26/22 211 lb 3.2 oz (95.8 kg)  01/09/22 190 lb (86.2 kg)    Discussed importance of daily weight and advised patient to weigh and record daily Reviewed role of diuretics in prevention of fluid overload and management of heart failure Discussed the importance of keeping all appointments with provider Assessed social determinant of health barriers Reviewed medications, discussed missed lab appointment Advised patient to take all medications and BP readings to all upcoming appointments Reviewed upcoming appointments including 08/30/22 with Oncology and 09/21/22 with Cardiology and reschedule missed appointments with PCP and HF Clinic   Hypertension: (Status: Goal on Track (progressing): YES.) Long Term Goal Patient will have someone pick up Amlodipine today Last practice recorded BP readings:  BP Readings from Last 3 Encounters:  06/21/22 (!) 150/80  06/17/22 138/88  05/18/22 128/86  Most recent eGFR/CrCl:  Lab Results  Component Value Date   EGFR 16 (L) 06/24/2022    No components found for: "CRCL"  Reviewed medications with patient and discussed importance of compliance;  Advised patient, providing education and rationale, to monitor blood pressure daily and record, calling PCP for findings outside established parameters;  Reviewed scheduled/upcoming provider appointments including: 08/30/22 with Oncology and 09/21/22 with Cardiology Discussed the importance of checking BP at home, patient reports good BP at home, unable to provide reading Reviewed Pharmacy note with patient, encouraged patient to obtain  amlodipine and take as directed   Patient Goals/Self-Care Activities: Take medications as prescribed   Attend all scheduled provider appointments Call pharmacy for medication refills 3-7 days in advance of running out of medications Call provider office for new concerns or questions  check blood sugar at prescribed times: once daily check blood pressure daily limit salt intake to 2000 mg/day

## 2022-08-26 NOTE — Patient Outreach (Signed)
Medicaid Managed Care   Nurse Care Manager Note  08/26/2022 Name:  Todd Mccoy MRN:  [id number] DOB:  06/22/1971  Todd Mccoy is an 52 y.o. year old male who is a primary patient of [name], MD.  The [facility] Managed Care Coordination team was consulted for assistance with:    CHF HTN  Mr. Fumero was given information about Medicaid Managed Care Coordination team services today. Todd Mccoy Patient agreed to services and verbal consent obtained.  Engaged with patient by telephone for follow up visit in response to provider referral for case management and/or care coordination services.   Assessments/Interventions:  Review of past medical history, allergies, medications, health status, including review of consultants reports, laboratory and other test data, was performed as part of comprehensive evaluation and provision of chronic care management services.  SDOH (Social Determinants of Health) assessments and interventions performed: SDOH Interventions    Flowsheet Row Patient Outreach Telephone from 07/14/2022 in [place] Patient Outreach Telephone from 06/14/2022 in [place] Patient Outreach Telephone from 03/22/2022 in [place] Coordination Patient Outreach Telephone from 09/30/2021 in [place] from 02/19/2021 in [place]  SDOH Interventions       Food Insecurity Interventions Intervention Not Indicated -- -- Intervention Not Indicated --  Housing Interventions Intervention Not Indicated -- Intervention Not Indicated Intervention Not Indicated Intervention Not Indicated  Transportation Interventions Intervention Not Indicated Intervention Not Indicated Intervention Not Indicated Intervention Not Indicated Intervention Not Indicated   Utilities Interventions Intervention Not Indicated -- -- -- --  Financial Strain Interventions -- -- -- Intervention Not Indicated --  Physical Activity Interventions -- -- -- Intervention Not Indicated --  Stress Interventions -- -- -- Intervention Not Indicated --  Social Connections Interventions -- -- -- Intervention Not Indicated --       Care Plan  No Known Allergies  Medications Reviewed Today     Reviewed by Melissa Montane, RN (Registered Nurse) on 08/26/22 at [place] List Status: <None>   Medication Order Taking? Sig Documenting Provider Last Dose Status Informant  amLODipine (NORVASC) 10 MG tablet [id number] No Take 1 tablet (10 mg total) by mouth daily. Todd Margarita, MD Taking Active   atorvastatin (LIPITOR) 20 MG tablet [id number] No Take 1 tablet (20 mg total) by mouth daily. Todd Margarita, MD Taking Active   Blood Pressure Monitoring (OMRON 3 SERIES BP MONITOR) DEVI [id number] No 1 each by Does not apply route daily. Todd Margarita, MD Taking Active   carvedilol (COREG) 25 MG tablet [id number] No Take 1.5 tablets (37.5 mg total) by mouth 2 (two) times daily. Todd Lund., NP Taking Active   ferrous sulfate 325 (65 FE) MG EC tablet [id number] No Take 325 mg by mouth daily with breakfast. [provider] Taking Active   hydrALAZINE (APRESOLINE) 50 MG tablet [id number] No Take 1 tablet (50 mg total) by mouth 3 (three) times daily. Todd Soho, DO Taking Active   hydrOXYzine (ATARAX) 25 MG tablet [id number] No Take 1 tablet (25 mg total) by mouth at bedtime as needed. [name], MD Taking Active   ibuprofen (ADVIL) 600 MG tablet [id number] No Take 1 tablet (600 mg total) by mouth every 6 (six) hours as needed. Todd Moras, PA-C Unknown Active     Discontinued 05/13/20 1120   insulin glargine (  LANTUS) 100 UNIT/ML Solostar Pen [id number] No Inject 10 Units into the skin at bedtime.  Patient not taking: Reported on 08/24/2022   [name], MD Not  Taking Active   isosorbide mononitrate (IMDUR) 60 MG 24 hr tablet [id number]  TAKE ONE TABLET BY MOUTH ONCE DAILY Todd Margarita, MD  Active   Discontinued 12/22/20 1205 (Stop Taking at Discharge)   Naphazoline HCl (CLEAR EYES OP) [id number] No Place 1 drop into both eyes as needed (irritation). [provider] Taking Active Self  pregabalin (LYRICA) 50 MG capsule [id number] No Take 1 capsule (50 mg total) by mouth 2 (two) times daily. [name], MD Taking Active   torsemide (DEMADEX) 20 MG tablet [id number] No Take 1 tablet (20 mg total) by mouth 2 (two) times daily. Todd Lund., NP Taking Active             Patient Active Problem List   Diagnosis Date Noted   Renal insufficiency    Acute on chronic systolic CHF (congestive heart failure) ([place]) 01/23/2022   Prolonged QT interval 01/23/2022   Chest pain 01/23/2022   Elevated troponin 01/23/2022   Anemia due to chronic kidney disease 01/23/2022   Hypertensive emergency 01/23/2022   Hyponatremia 01/23/2022   Chronic combined systolic (congestive) and diastolic (congestive) heart failure ([place]) 04/07/2021   Chronic renal disease, stage 4, severely decreased glomerular filtration rate between 15-29 mL/min/1.73 square meter (HCC)    Hypertensive urgency    Acute kidney injury superimposed on chronic kidney disease ([place])    Acute CHF (congestive heart failure) ([place]) 02/27/2021   Normocytic anemia 02/27/2021   Anasarca 02/27/2021   Abscess of left foot 12/10/2020   S/P BKA (below knee amputation) unilateral, left ([place]) 12/10/2020   Dyslipidemia 12/10/2020   CKD (chronic kidney disease), stage III ([place]) 12/10/2020   HTN (hypertension) 11/12/2020   Cellulitis and abscess of foot    Diabetes mellitus with hyperglycemia ([place]) 09/07/2014   Tobacco abuse 09/07/2014   Abscess of back    Abscess of lower back 09/06/2014   DM2 (diabetes mellitus, type 2) ([place]) 09/06/2014   Sepsis ([place]) 09/06/2014   Scrotal abscess  06/20/2014    Conditions to be addressed/monitored per PCP order:  CHF and HTN  Care Plan : RN Care Manager Plan of Care  Updates made by Melissa Montane, RN since 08/26/2022 12:00 AM     Problem: Chronic Disease Management and Care Coordination Needs for CHF, HTN, DM   Priority: High     Long-Range Goal: Development of Plan of Care for Chronic Disease Management and Care Coordination Needs (CHF, HTN, DM)   Start Date: 09/30/2021  Expected End Date: 11/02/2022  Priority: High  Note:   Current Barriers:  Knowledge Deficits related to plan of care for management of CHF, HTN, and DMII  Chronic Disease Management support and education needs related to CHF, HTN, and DMII Difficulty obtaining medications Recent Vision 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  RNCM Clinical Goal(s):  Patient will verbalize understanding of plan for management of CHF, HTN, and DMII as evidenced by improvement in management of these chronic diseases. verbalize basic understanding of CHF, HTN, and DMII disease process and self health management plan as evidenced by improved control of factors that cause CHF, improved blood pressure readings < 140/90 and improved control of blood sugar readings with range with 80 - 120. take all medications exactly as prescribed and will call provider for  medication related questions as evidenced by compliance with medications    attend all scheduled medical appointments:  08/30/22 with Oncology and 09/21/22 with Cardiology as evidenced by attending all scheduled appointments        demonstrate improved adherence to prescribed treatment plan for CHF, HTN, and DMII as evidenced by better control of blood pressure, blood sugars and factors impacting CHF. continue to work with Consulting civil engineer and/or Social Worker to address care management and care coordination needs related to CHF, HTN, and DMII as evidenced by adherence to CM Team Scheduled  appointments     through collaboration with RN Care manager, provider, and care team.   Interventions: Inter-disciplinary care team collaboration (see longitudinal plan of care) Evaluation of current treatment plan related to  self management and patient's adherence to plan as established by provider Advised patient to reschedule missed visits with PCP and HF Clinic  Heart Failure Interventions:  (Status: Goal on Track (progressing): YES.)  Long Term Goal  Wt Readings from Last 3 Encounters:  02/09/22 190 lb (86.2 kg)  01/26/22 211 lb 3.2 oz (95.8 kg)  01/09/22 190 lb (86.2 kg)    Discussed importance of daily weight and advised patient to weigh and record daily Reviewed role of diuretics in prevention of fluid overload and management of heart failure Discussed the importance of keeping all appointments with provider Assessed social determinant of health barriers Reviewed medications, discussed missed lab appointment Advised patient to take all medications and BP readings to all upcoming appointments Reviewed upcoming appointments including 08/30/22 with Oncology and 09/21/22 with Cardiology and reschedule missed appointments with PCP and HF Clinic   Hypertension: (Status: Goal on Track (progressing): YES.) Long Term Goal Patient will have someone pick up Amlodipine today Last practice recorded BP readings:  BP Readings from Last 3 Encounters:  06/21/22 (!) 150/80  06/17/22 138/88  05/18/22 128/86  Most recent eGFR/CrCl:  Lab Results  Component Value Date   EGFR 16 (L) 06/24/2022    No components found for: "CRCL"  Reviewed medications with patient and discussed importance of compliance;  Advised patient, providing education and rationale, to monitor blood pressure daily and record, calling PCP for findings outside established parameters;  Reviewed scheduled/upcoming provider appointments including: 08/30/22 with Oncology and 09/21/22 with Cardiology Discussed the importance of  checking BP at home, patient reports good BP at home, unable to provide reading Reviewed Pharmacy note with patient, encouraged patient to obtain amlodipine and take as directed   Patient Goals/Self-Care Activities: Take medications as prescribed   Attend all scheduled provider appointments Call pharmacy for medication refills 3-7 days in advance of running out of medications Call provider office for new concerns or questions  check blood sugar at prescribed times: once daily check blood pressure daily limit salt intake to 2000 mg/day       Follow Up:  Patient agrees to Care Plan and Follow-up.  Plan: The Managed Medicaid care management team will reach out to the patient again over the next 60 days.  Date/time of next scheduled RN care management/care coordination outreach:  09/29/22 @ [place] RN, [name] [place]  [employer] manager

## 2022-08-27 ENCOUNTER — Other Ambulatory Visit: Payer: Medicaid Other | Admitting: Pharmacist

## 2022-08-30 ENCOUNTER — Ambulatory Visit: Payer: Medicaid Other | Admitting: Family Medicine

## 2022-08-30 ENCOUNTER — Inpatient Hospital Stay: Payer: Medicaid Other | Admitting: Hematology and Oncology

## 2022-08-30 ENCOUNTER — Inpatient Hospital Stay: Payer: Medicaid Other

## 2022-08-30 ENCOUNTER — Other Ambulatory Visit: Payer: Self-pay | Admitting: Family Medicine

## 2022-09-03 DIAGNOSIS — Z419 Encounter for procedure for purposes other than remedying health state, unspecified: Secondary | ICD-10-CM | POA: Diagnosis not present

## 2022-09-09 ENCOUNTER — Telehealth: Payer: Self-pay | Admitting: Pharmacist

## 2022-09-09 ENCOUNTER — Other Ambulatory Visit: Payer: Medicaid Other | Admitting: Pharmacist

## 2022-09-09 NOTE — Progress Notes (Signed)
Attempted to contact patient x2 for scheduled appointment for medication management. Left HIPAA compliant message for patient to return my call at their convenience.   Larinda Buttery, PharmD Clinical Pharmacist [facility] [phone]

## 2022-09-09 NOTE — Progress Notes (Unsigned)
09/09/2022 Name: Todd Mccoy MRN: [id number] DOB: 09-Jun-1971    Todd Mccoy is a 52 y.o. year old male who presented for a telephone visit.   They were referred to the pharmacist by their Case Management Team  for assistance in managing diabetes.  His niece provides a medication review by assisting with reading pill bottles, patient is legally blind.  Patient is participating in a Managed Medicaid Plan:  Yes  Subjective:  Care Team: Primary Care Provider: [name], MD ; Next Scheduled Visit: 08/19/22  Medication Access/Adherence  Current Pharmacy:  [place] ([place] - [address] [place] [address] Phone: [phone] Fax: [place], [place] - [address] [place] [address] Phone: [phone] Fax: [phone]   Patient reports affordability concerns with their medications: No  Patient reports access/transportation concerns to their pharmacy: No  Patient reports adherence concerns with their medications:  No  , pill box    Diabetes:  Current medications: lantus 4-5 units at bedtime (prescribed 10 units), inconsistent usage lately due to symptoms of "can't breathe, hallucinations, messing with his mind" when he lays down. These symptoms do not occur when he skips insulin, so patient is concerned his blood sugar is going too low.  Current glucose readings: 110-150s Using traditional meter; testing one time daily in evening prior to administering insulin.  Patient reports hypoglycemic s/sx including some dizziness, can't breath, hallucinating through the night  Patient denies hyperglycemic symptoms including polyuria, polydipsia, polyphagia, nocturia, neuropathy, blurred vision.  Current meal patterns:  Breakfast: eggs, grits Lunch: sometimes skips, but small snack occasionally, fruit Supper: chicken, beef  stew  Current physical activity: limited with unilateral BKA, wheelchair bound  Current medication access support: none at present  Hypertension:  Current medications: carvedilol 37.'5mg'$  BID, hydralazine '50mg'$  TID, imdur '60mg'$  daily, torsemide '20mg'$  daily, amlodipine '10mg'$  daily Medications previously tried:   Patient has a validated, automated, upper arm home BP cuff Current blood pressure readings readings: 150/100s, checks twice weekly AFTER smoking and coffee  Patient denies hypotensive s/sx including dizziness, lightheadedness.  Patient denies hypertensive symptoms including headache, chest pain, shortness of breath  Medication Management:  Current adherence strategy: niece helps by putting medicine into pill bottles  Patient reports Fair adherence to medications  Patient reports the following barriers to adherence: interested in adherence packaging   Objective:  Lab Results  Component Value Date   HGBA1C 6.9 02/24/2022    Lab Results  Component Value Date   CREATININE 4.37 (H) 06/29/2022   BUN 56 (H) 06/29/2022   NA 142 06/29/2022   K 4.5 06/29/2022   CL 116 (H) 06/29/2022   CO2 20 (L) 06/29/2022    Lab Results  Component Value Date   CHOL 82 (L) 05/21/2021   HDL 27 (L) 05/21/2021   LDLCALC 40 05/21/2021   LDLDIRECT 36 05/21/2021   TRIG 67 05/21/2021   CHOLHDL 3.0 05/21/2021    Medications Reviewed Today     Reviewed by Melissa Montane, RN (Registered Nurse) on 08/26/22 at 1248  Med List Status: <None>   Medication Order Taking? Sig Documenting Provider Last Dose Status Informant  amLODipine (NORVASC) 10 MG tablet [id number] No Take 1 tablet (10 mg total) by mouth daily. [name], MD Taking Active   atorvastatin (LIPITOR) 20 MG tablet [id number] No Take 1 tablet (20 mg total) by mouth daily. [name],  MD Taking Active   Blood Pressure Monitoring (OMRON 3 SERIES BP MONITOR) DEVI [id number] No 1 each by Does not apply route daily. [name],  MD Taking Active   carvedilol (COREG) 25 MG tablet [id number] No Take 1.5 tablets (37.5 mg total) by mouth 2 (two) times daily. [name], NP Taking Active   ferrous sulfate 325 (65 FE) MG EC tablet [id number] No Take 325 mg by mouth daily with breakfast. [provider] Taking Active   hydrALAZINE (APRESOLINE) 50 MG tablet [id number] No Take 1 tablet (50 mg total) by mouth 3 (three) times daily. [name], DO Taking Active   hydrOXYzine (ATARAX) 25 MG tablet [id number] No Take 1 tablet (25 mg total) by mouth at bedtime as needed. [name], MD Taking Active   ibuprofen (ADVIL) 600 MG tablet [id number] No Take 1 tablet (600 mg total) by mouth every 6 (six) hours as needed. [name], PA-C Unknown Active     Discontinued 05/13/20 1120   insulin glargine (LANTUS) 100 UNIT/ML Solostar Pen [id number] No Inject 10 Units into the skin at bedtime.  Patient not taking: Reported on 08/24/2022   [name], MD Not Taking Active   isosorbide mononitrate (IMDUR) 60 MG 24 hr tablet [id number]  TAKE ONE TABLET BY MOUTH ONCE DAILY [name], MD  Active   Discontinued 12/22/20 1205 (Stop Taking at Discharge)   Naphazoline HCl (CLEAR EYES OP) [id number] No Place 1 drop into both eyes as needed (irritation). [provider] Taking Active Self  pregabalin (LYRICA) 50 MG capsule [id number] No Take 1 capsule (50 mg total) by mouth 2 (two) times daily. [name], MD Taking Active   torsemide (DEMADEX) 20 MG tablet [id number] No Take 1 tablet (20 mg total) by mouth 2 (two) times daily. [name], NP Taking Active               Assessment/Plan:   Diabetes: - Currently controlled,  recommend temporarily lower lantus dosing to 4-6 units nightly, on a CONSISTENT basis instead of sporadic usage. - Reviewed goal A1c, goal fasting, and goal 2 hour post prandial glucose - Recommend to continue current regimen  - Recommend to check glucose first thing in the  morning, in addition to his bedtime checks. We will discuss these numbers to investigate patient concerns of hypoglycemia.  Hypertension: - Currently uncontrolled - Reviewed long term cardiovascular and renal outcomes of uncontrolled blood pressure - Reviewed appropriate blood pressure monitoring technique and reviewed goal blood pressure. Recommended to check home blood pressure and heart rate twice weekly, but reinforced proper technique and encouraged patient to check BEFORE smoking or coffee - Recommend to refill amlodipine, patient verbalizes understanding and agrees to plan    Medication Management: - Currently strategy insufficient to maintain appropriate adherence to prescribed medication regimen -  Reinforced use of weekly pill box to organize medications - Discussed collaboration with local pharmacies for adherence packaging. Reviewed local pharmacies with adherence packaging options. Patient elects to utilize central pharmacy services when adherence packing is in place. - Reviewed medication list for diarrhea in response to patient question, no high-offending medications at present. Discussed with patient, who verbalized understanding.   Follow Up Plan: 2 weeks to review blood pressure after restarting amlodipine and implementing BP monitoring techniques, as well as review fasting BG values  Larinda Buttery, PharmD Clinical Pharmacist [facility] [phone]

## 2022-09-16 ENCOUNTER — Other Ambulatory Visit: Payer: Medicaid Other | Admitting: Pharmacist

## 2022-09-17 ENCOUNTER — Other Ambulatory Visit: Payer: Medicaid Other | Admitting: Pharmacist

## 2022-09-17 ENCOUNTER — Telehealth: Payer: Self-pay | Admitting: Pharmacist

## 2022-09-17 NOTE — Progress Notes (Signed)
Attempted to contact patient x2 for scheduled appointment for medication management. Left HIPAA compliant message for patient to return my call at their convenience.   Addalee Kavanagh, PharmD Clinical Pharmacist [facility] [phone]   

## 2022-09-20 NOTE — Progress Notes (Unsigned)
Office Visit    Patient Name: Todd Mccoy Date of Encounter: 09/21/2022  Primary Care Provider:  [name], MD Primary Cardiologist:  Fransico Him, MD Primary Electrophysiologist: None  Chief Complaint    Todd Mccoy is a 52 y.o. 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  Past Medical History    Past Medical History:  Diagnosis Date   Anemia    Anemia    Anxiety    Chronic combined systolic (congestive) and diastolic (congestive) heart failure (HCC)    EF 40-45% with G3DD on echo 2022   CKD (chronic kidney disease), stage IV ([place]) 12/10/2020   Diabetes mellitus with complication (HCC)    Diabetic neuropathy (HCC)    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[place]) 03/25/2020   pt states retinal eye damage- left worse than the right- recent visited MD    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[place]) 03/25/2020   pt states recent MD visit /left eye worse than right   Dyslipidemia 12/10/2020   Hx of BKA, left ([place])    Hypertension    Morbid obesity ([place])    Osteomyelitis ([place])    Pneumonia    Past Surgical History:  Procedure Laterality Date   ABSCESS DRAINAGE     neck   AMPUTATION Left 11/14/2020   Procedure: LEFT 5TH RAY AMPUTATION;  Surgeon: Newt Minion, MD;  Location: [facility];  Service: Orthopedics;  Laterality: Left;   AMPUTATION Left 12/10/2020   Procedure: LEFT BELOW KNEE AMPUTATION;  Surgeon: Newt Minion, MD;  Location: [facility];  Service: Orthopedics;  Laterality: Left;   AMPUTATION TOE Right 03/25/2020   Procedure: AMPUTATION TOE;  Surgeon: Evelina Bucy, DPM;  Location: [facility];  Service: Podiatry;  Laterality: Right;   INCISION AND DRAINAGE ABSCESS Right 09/07/2014   Procedure: INCISION AND DRAINAGE ABSCESS RIGHT FLANK;  Surgeon: Jackolyn Confer, MD;   Location: [facility];  Service: General;  Laterality: Right;   LEG AMPUTATION BELOW KNEE Left 12/10/2020   SCROTUM EXPLORATION      Allergies  No Known Allergies  History of Present Illness    Todd Mccoy  is a 52 year old male with the above mention past medical history who presents today for 50-month follow-up of congestive heart failure.  [name] was seen 05/18/2022 for 50-month follow-up of blood pressure. follow-up of blood pressure. During visit patient's blood pressure was controlled however he was volume up on examination. Patient's Lasix was increased to 80 mg x 1 and was switched to torsemide following elevated BNP measurement.  He was seen in follow-up on 06/17/2022.  During visit patient's blood pressure was initially elevated and was 138/88 on recheck.  He was volume overloaded on exam and Lasix was switched out for torsemide.  He was previously referred to the congestive heart failure clinic however missed his referral call.  He was seen by Dr.[name] on 06/21/2022.  During visit patient was hypervolemic and torsemide was increased to 80 mg daily with plan to increase to 100 mg with addition of metolazone to assist in diuresis.  Hydralazine was increased to 50 mg 3 times daily.  Since last being seen in the office patient reports he has been feeling well with less shortness of breath.  He is tolerating his current medication regimen without any adverse reactions.  He is  also compliant with his meds and reports no missed doses.  He is watching his salt intake also but he is unable to weigh due to having a small scale at home.  I was able to obtain his weight today and he was 209 pounds.  Patient denies chest pain, palpitations, dyspnea, PND, orthopnea, nausea, vomiting, dizziness, syncope, edema, weight gain, or early satiety.  Home Medications    Current Outpatient Medications  Medication Sig Dispense Refill   amLODipine (NORVASC) 10 MG tablet Take 1 tablet (10 mg total) by mouth daily. 30 tablet 3    atorvastatin (LIPITOR) 20 MG tablet Take 1 tablet (20 mg total) by mouth daily. 90 tablet 3   Blood Pressure Monitoring (OMRON 3 SERIES BP MONITOR) DEVI 1 each by Does not apply route daily. 1 each 0   carvedilol (COREG) 25 MG tablet Take 1.5 tablets (37.5 mg total) by mouth 2 (two) times daily. 90 tablet 3   ferrous sulfate 325 (65 FE) MG EC tablet Take 325 mg by mouth daily with breakfast.     hydrOXYzine (ATARAX) 25 MG tablet Take 1 tablet (25 mg total) by mouth at bedtime as needed. 30 tablet 1   ibuprofen (ADVIL) 600 MG tablet Take 1 tablet (600 mg total) by mouth every 6 (six) hours as needed. 30 tablet 0   insulin glargine (LANTUS) 100 UNIT/ML Solostar Pen Inject 10 Units into the skin at bedtime. 9 mL 3   isosorbide mononitrate (IMDUR) 60 MG 24 hr tablet TAKE ONE TABLET BY MOUTH ONCE DAILY 30 tablet 3   Naphazoline HCl (CLEAR EYES OP) Place 1 drop into both eyes as needed (irritation).     pregabalin (LYRICA) 50 MG capsule Take 1 capsule by mouth twice daily 60 capsule 0   hydrALAZINE (APRESOLINE) 100 MG tablet Take 1 tablet (100 mg total) by mouth 2 (two) times daily. 60 tablet 3   torsemide 40 MG TABS Take 80 mg by mouth 2 (two) times daily.     No current facility-administered medications for this visit.     Review of Systems  Please see the history of present illness.    (+) +1 lower extremity swelling  All other systems reviewed and are otherwise negative except as noted above.  Physical Exam    Wt Readings from Last 3 Encounters:  02/09/22 190 lb (86.2 kg)  01/26/22 211 lb 3.2 oz (95.8 kg)  01/09/22 190 lb (86.2 kg)   VS: Vitals:   09/21/22 1116 09/21/22 1247  BP: (!) 146/90 (!) 142/90  Pulse: 79   SpO2: 97%   ,Body mass index is 27.26 kg/m.  Constitutional:      Appearance: Healthy appearance. Not in distress.  Neck:     Vascular: JVD normal.  Pulmonary:     Effort: Pulmonary effort is normal.     Breath sounds: No wheezing. No rales. Diminished in the  bases Cardiovascular:     Normal rate. Regular rhythm. Normal S1. Normal S2.      Murmurs: There is no murmur.  Edema:    Peripheral edema absent.  Abdominal:     Palpations: Abdomen is soft non tender. There is no hepatomegaly.  Skin:    General: Skin is warm and dry.  Neurological:     General: No focal deficit present.     Mental Status: Alert and oriented to person, place and time.     Cranial Nerves: Cranial nerves are intact.  EKG/LABS/ Recent Cardiac Studies    ECG  personally reviewed by me today -none completed today   STOP-Bang Score:           Lab Results  Component Value Date   WBC 3.3 (L) 02/21/2022   HGB 9.5 (L) 02/21/2022   HCT 30.1 (L) 02/21/2022   MCV 87.5 02/21/2022   PLT 164 02/21/2022   Lab Results  Component Value Date   CREATININE 4.37 (H) 06/29/2022   BUN 56 (H) 06/29/2022   NA 142 06/29/2022   K 4.5 06/29/2022   CL 116 (H) 06/29/2022   CO2 20 (L) 06/29/2022   Lab Results  Component Value Date   ALT 18 05/21/2021   AST 25 05/21/2021   ALKPHOS 169 (H) 05/21/2021   BILITOT 0.4 05/21/2021   Lab Results  Component Value Date   CHOL 82 (L) 05/21/2021   HDL 27 (L) 05/21/2021   LDLCALC 40 05/21/2021   LDLDIRECT 36 05/21/2021   TRIG 67 05/21/2021   CHOLHDL 3.0 05/21/2021    Lab Results  Component Value Date   HGBA1C 6.9 02/24/2022    Cardiac Studies & Procedures     STRESS TESTS  MYOCARDIAL PERFUSION IMAGING 04/23/2021  Narrative   Findings are consistent with no prior ischemia and no prior myocardial infarction. The study is intermediate risk.   No ST deviation was noted.   LV perfusion is normal.   Left ventricular function is abnormal. Global function is moderately reduced. Nuclear stress EF: 43 %. The left ventricular ejection fraction is moderately decreased (30-44%). End diastolic cavity size is mildly enlarged. End systolic cavity size is mildly enlarged.   Prior study not available for comparison.  Findings: Negative for  stress induced arrhythmias. Decreased LV function with global hypokinesis;  LVEF 43%, mild increase in volumes  Conclusions: Stress test is negative for ischemia or infarction.   ECHOCARDIOGRAM  ECHOCARDIOGRAM COMPLETE 01/23/2022  Narrative ECHOCARDIOGRAM REPORT    Patient Name:   Todd Mccoy Date of Exam: 01/23/2022 Medical Rec #:  [id number]           Height:       70.0 in Accession #:    [id number]          Weight:       190.0 lb Date of Birth:  25-Nov-1970           BSA:          2.042 m Patient Age:    8 years            BP:           152/101 mmHg Patient Gender: M                   HR:           67 bpm. Exam Location:  Inpatient  Procedure: 2D Echo, 3D Echo, Cardiac Doppler, Color Doppler and Strain Analysis  Indications:    CHF  History:        Patient has prior history of Echocardiogram examinations, most recent 02/27/2021. Signs/Symptoms:Edema; Risk Factors:Hypertension. ESRD.  Sonographer:    Merrie Roof RDCS Referring Phys: Norval Morton   Sonographer Comments: Global longitudinal strain was attempted. IMPRESSIONS   1. Left ventricular ejection fraction by 3D volume is 38 %. The left ventricle has moderate to severely decreased function. The left ventricle demonstrates global hypokinesis. There is severe left ventricular hypertrophy. Left ventricular diastolic parameters are consistent with Grade III diastolic dysfunction (restrictive). The average left ventricular global longitudinal strain is -  8.8 %. The global longitudinal strain is abnormal. Strain pattern consistent with apical sparing, consider an evaluation for cardiac amyloidosis if clinically relevant. 2. Right ventricular systolic function is moderately reduced. The right ventricular size is mildly enlarged. Moderately increased right ventricular wall thickness. There is normal pulmonary artery systolic pressure. The estimated right ventricular systolic pressure is 123456 mmHg. 3. Left atrial size  was severely dilated. 4. A small pericardial effusion is present. There is no evidence of cardiac tamponade. 5. The mitral valve is grossly normal. Mild mitral valve regurgitation. No evidence of mitral stenosis. 6. The aortic valve is tricuspid. There is mild thickening of the aortic valve. Aortic valve regurgitation is not visualized. No aortic stenosis is present. 7. The inferior vena cava is normal in size with greater than 50% respiratory variability, suggesting right atrial pressure of 3 mmHg.  FINDINGS Left Ventricle: Left ventricular ejection fraction by 3D volume is 38 %. The left ventricle has moderate to severely decreased function. The left ventricle demonstrates global hypokinesis. The average left ventricular global longitudinal strain is -8.8 %. The global longitudinal strain is abnormal. The left ventricular internal cavity size was normal in size. There is severe left ventricular hypertrophy. Left ventricular diastolic parameters are consistent with Grade III diastolic dysfunction (restrictive).  Right Ventricle: The right ventricular size is mildly enlarged. Moderately increased right ventricular wall thickness. Right ventricular systolic function is moderately reduced. There is normal pulmonary artery systolic pressure. The tricuspid regurgitant velocity is 2.13 m/s, and with an assumed right atrial pressure of 3 mmHg, the estimated right ventricular systolic pressure is 123456 mmHg.  Left Atrium: Left atrial size was severely dilated.  Right Atrium: Right atrial size was normal in size.  Pericardium: A small pericardial effusion is present. There is no evidence of cardiac tamponade.  Mitral Valve: The mitral valve is grossly normal. There is mild thickening of the mitral valve leaflet(s). Mild mitral valve regurgitation. No evidence of mitral valve stenosis.  Tricuspid Valve: The tricuspid valve is normal in structure. Tricuspid valve regurgitation is mild . No evidence of  tricuspid stenosis.  Aortic Valve: The aortic valve is tricuspid. There is mild thickening of the aortic valve. Aortic valve regurgitation is not visualized. No aortic stenosis is present.  Pulmonic Valve: The pulmonic valve was normal in structure. Pulmonic valve regurgitation is mild. No evidence of pulmonic stenosis.  Aorta: The aortic root is normal in size and structure.  Venous: The inferior vena cava is normal in size with greater than 50% respiratory variability, suggesting right atrial pressure of 3 mmHg.  IAS/Shunts: No atrial level shunt detected by color flow Doppler.   LEFT VENTRICLE PLAX 2D LVIDd:         4.10 cm         Diastology LVIDs:         3.40 cm         LV e' medial:    4.35 cm/s LV PW:         1.60 cm         LV E/e' medial:  20.0 LV IVS:        2.00 cm         LV e' lateral:   4.90 cm/s LVOT diam:     2.20 cm         LV E/e' lateral: 17.8 LVOT Area:     3.80 cm 2D Longitudinal Strain 2D Strain GLS  -8.8 % Avg:  3D Volume EF LV 3D EF:  Left ventricul ar ejection fraction by 3D volume is 38 %.  3D Volume EF: 3D EF:        38 % LV EDV:       171 ml LV ESV:       106 ml LV SV:        65 ml  RIGHT VENTRICLE            IVC RV Basal diam:  4.50 cm    IVC diam: 2.10 cm RV Mid diam:    3.80 cm RV S prime:     6.42 cm/s TAPSE (M-mode): 0.7 cm  LEFT ATRIUM              Index        RIGHT ATRIUM           Index LA diam:        5.30 cm  2.60 cm/m   RA Area:     19.50 cm LA Vol (A2C):   130.0 ml 63.65 ml/m  RA Volume:   61.10 ml  29.92 ml/m LA Vol (A4C):   91.0 ml  44.56 ml/m LA Biplane Vol: 112.0 ml 54.84 ml/m  AORTA Ao Root diam: 3.50 cm Ao Asc diam:  3.00 cm  MITRAL VALVE               TRICUSPID VALVE MV Area (PHT): 4.80 cm    TR Peak grad:   18.1 mmHg MV Decel Time: 158 msec    TR Vmax:        213.00 cm/s MV E velocity: 87.20 cm/s MV A velocity: 44.20 cm/s  SHUNTS MV E/A ratio:  1.97        Systemic Diam: 2.20 cm  Cherlynn Kaiser MD Electronically signed by Cherlynn Kaiser MD Signature Date/Time: 01/23/2022/5:30:29 PM    Final    MONITORS  LONG TERM MONITOR (3-14 DAYS) 05/03/2021  Narrative  Predominant rhythm was sinus tachycardia with average heart rate 104 bpm and ranged from 88 to 118 bpm  Rare PAC  Rare PVC and ventricular with PVC load less than 1%   Patch Wear Time:  1 days and 18 hours (2022-10-11T11:11:24-0400 to 2022-10-13T06:05:48-0400)  Patient had a min HR of 88 bpm, max HR of 118 bpm, and avg HR of 104 bpm. Predominant underlying rhythm was Sinus Rhythm. Isolated SVEs were rare (<1.0%), and no SVE Couplets or SVE Triplets were present. Isolated VEs were rare (<1.0%), VE Couplets were rare (<1.0%), and no VE Triplets were present.     PYP SCAN  MYOCARDIAL AMYLOID PLANAR AND SPECT 02/15/2022  Narrative   By semi-quantitative assessment scan is consistent with no increased heart uptake-Grade 0. Heart to contralateral lung ratio is between 1-1.5, indeterminate for amyloid.   Study is equivocal for TTR amyloidosis (visual score of 1/ratio between 1-1.5). 1.2        Assessment & Plan    1.  Essential hypertension: -HYPERTENSION CONTROL Vitals:   09/21/22 1116 09/21/22 1247  BP: (!) 146/90 (!) 142/90    The patient's blood pressure is elevated above target today.  In order to address the patient's elevated BP: Blood pressure will be monitored at home to determine if medication changes need to be made.; A current anti-hypertensive medication was adjusted today.      -Continue Norvasc 10 mg, carvedilol 37.5, Imdur 60 mg daily. -We will increase hydralazine to 100 mg twice daily -Patient was encouraged to check blood pressures over the next 2 weeks and  report findings back to the office.  2.  Chronic combined systolic and diastolic CHF: -Patient's last 2D echo was 01/2022 showing EF of 38% with moderate LVH and severely decreased LV function with global hypokinesis and LVH with  grade 3 DD -Today patient was volume up on exam with crackles and +1 pitting edema in right lower extremity. -We will have him increase torsemide to 100 mg for 3 days and then back to 80 mg daily -BMET in 1 week -Low sodium diet, fluid restriction <2L, and daily weights encouraged. Educated to contact our office for weight gain of 2 lbs overnight or 5 lbs in one week.    3.  DM type II: -Patient's most recent hemoglobin A1c was 6.9% -Continue current diabetic regimen per PCP   4.  CKD stage IV: -Patient is currently followed by nephrology and has a scheduled follow-up in 1 month.   5.  Tobacco abuse: -Patient encouraged to discontinue and offered advice and support when he is ready to stop.   Disposition: Follow-up with Fransico Him, MD or APP in 3 months    Medication Adjustments/Labs and Tests Ordered: Current medicines are reviewed at length with the patient today.  Concerns regarding medicines are outlined above.   Signed, Mable Fill, Marissa Nestle, NP 09/21/2022, 12:47 PM [facility]  Note:  This document was prepared using Dragon voice recognition software and may include unintentional dictation errors.

## 2022-09-21 ENCOUNTER — Ambulatory Visit: Payer: Medicaid Other | Attending: Nurse Practitioner | Admitting: Nurse Practitioner

## 2022-09-21 ENCOUNTER — Encounter: Payer: Self-pay | Admitting: Nurse Practitioner

## 2022-09-21 VITALS — BP 142/90 | HR 79 | Ht 70.0 in | Wt 209.0 lb

## 2022-09-21 DIAGNOSIS — N184 Chronic kidney disease, stage 4 (severe): Secondary | ICD-10-CM

## 2022-09-21 DIAGNOSIS — E11628 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 complications: Secondary | ICD-10-CM | POA: Diagnosis not present

## 2022-09-21 DIAGNOSIS — I10 Essential (primary) hypertension: Secondary | ICD-10-CM

## 2022-09-21 DIAGNOSIS — I5042 Chronic combined systolic (congestive) and diastolic (congestive) heart failure: Secondary | ICD-10-CM

## 2022-09-21 DIAGNOSIS — R601 Generalized edema: Secondary | ICD-10-CM

## 2022-09-21 DIAGNOSIS — Z720 Tobacco use: Secondary | ICD-10-CM

## 2022-09-21 MED ORDER — HYDRALAZINE HCL 100 MG PO TABS: 100.0000 mg | ORAL_TABLET | Freq: Two times a day (BID) | ORAL | 3 refills | Status: DC

## 2022-09-21 MED ORDER — TORSEMIDE 40 MG PO TABS: 80.0000 mg | ORAL_TABLET | Freq: Two times a day (BID) | ORAL | Status: DC

## 2022-09-21 NOTE — Patient Instructions (Addendum)
Medication Instructions:  TAKE an additional 20mg  of Torsemide for 3 days then go back to 80mg  once a day INCREASE Hydralazine to 100mg  Take 1 tablet twice a day  *If you need a refill on your cardiac medications before your next appointment, please call your pharmacy*   Lab Work: [place] BMET If you have labs (blood work) drawn today and your tests are completely normal, you will receive your results only by: [place] (if you have MyChart) OR A paper copy in the mail If you have any lab test that is abnormal or we need to change your treatment, we will call you to review the results.   Testing/Procedures: NONE ORDERED   Follow-Up: At [facility], you and your health needs are our priority.  As part of our continuing mission to provide you with exceptional heart care, we have created designated Provider Care Teams.  These Care Teams include your primary Cardiologist (physician) and Advanced Practice Providers (APPs -  Physician Assistants and Nurse Practitioners) who all work together to provide you with the care you need, when you need it.  We recommend signing up for the patient portal called "MyChart".  Sign up information is provided on this After Visit Summary.  MyChart is used to connect with patients for Virtual Visits (Telemedicine).  Patients are able to view lab/test results, encounter notes, upcoming appointments, etc.  Non-urgent messages can be sent to your provider as well.   To learn more about what you can do with MyChart, go to [email or web address].    Your next appointment:   3 month(s)  Provider:   Fransico Him, MD     Other Instructions

## 2022-09-22 ENCOUNTER — Inpatient Hospital Stay: Payer: Medicaid Other

## 2022-09-22 ENCOUNTER — Inpatient Hospital Stay: Payer: Medicaid Other | Attending: Hematology and Oncology | Admitting: Physician Assistant

## 2022-09-22 NOTE — Progress Notes (Deleted)
[place] [phone]   [phone] NOTE  Patient Care Team: [name], MD as PCP - General (Family Medicine) [name], MD as PCP - Cardiology (Cardiology) Melissa Montane, RN as Case Manager  Hematological/Oncological History # ***  CHIEF COMPLAINTS/PURPOSE OF CONSULTATION:  "*** "  HISTORY OF PRESENTING ILLNESS:  Todd Mccoy 52 y.o. male with medical history significant for ***  On review of the previous records ***  On exam today ***  MEDICAL HISTORY:  Past Medical History:  Diagnosis Date   Anemia    Anemia    Anxiety    Chronic combined systolic (congestive) and diastolic (congestive) heart failure (HCC)    EF 40-45% with G3DD on echo 2022   CKD (chronic kidney disease), stage IV ([place]) 12/10/2020   Diabetes mellitus with complication ([place])    Diabetic neuropathy ([place])    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[place]) 03/25/2020   pt states retinal eye damage- left worse than the right- recent visited MD    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[place]) 03/25/2020   pt states recent MD visit /left eye worse than right   Dyslipidemia 12/10/2020   Hx of BKA, left ([place])    Hypertension    Morbid obesity ([place])    Osteomyelitis ([place])    Pneumonia     SURGICAL HISTORY: Past Surgical History:  Procedure Laterality Date   ABSCESS DRAINAGE     neck   AMPUTATION Left 11/14/2020   Procedure: LEFT 5TH RAY AMPUTATION;  Surgeon: Newt Minion, MD;  Location: [place];  Service: Orthopedics;  Laterality: Left;   AMPUTATION Left 12/10/2020   Procedure: LEFT BELOW KNEE AMPUTATION;  Surgeon: Newt Minion, MD;  Location: [place];  Service: Orthopedics;  Laterality: Left;   AMPUTATION TOE Right 03/25/2020   Procedure: AMPUTATION TOE;  Surgeon: Evelina Bucy, DPM;  Location: [facility];  Service: Podiatry;  Laterality: Right;   INCISION AND DRAINAGE ABSCESS Right 09/07/2014   Procedure: INCISION AND DRAINAGE ABSCESS  RIGHT FLANK;  Surgeon: Jackolyn Confer, MD;  Location: [facility];  Service: General;  Laterality: Right;   LEG AMPUTATION BELOW KNEE Left 12/10/2020   SCROTUM EXPLORATION      SOCIAL HISTORY: Social History   Socioeconomic History   Marital status: Single    Spouse name: Not on file   Number of children: 1   Years of education: Not on file   Highest education level: Not on file  Occupational History   Not on file  Tobacco Use   Smoking status: Some Days    Packs/day: 1.00    Years: 32.00    Additional pack years: 0.00    Total pack years: 32.00    Types: Cigarettes    Last attempt to quit: 08/24/2021    Years since quitting: 1.0    Passive exposure: Past   Smokeless tobacco: Former   Tobacco comments:    Quit smoking previously February 2023  Vaping Use   Vaping Use: Never used  Substance and Sexual Activity   Alcohol use: No   Drug use: No   Sexual activity: Not on file  Other Topics Concern   Not on file  Social History Narrative   Not on file   Social Determinants of Health   Financial Resource Strain: Low Risk  (09/30/2021)   Overall Financial Resource Strain (CARDIA)    Difficulty of Paying Living Expenses: Not hard at all  Food  Insecurity: No Food Insecurity (07/14/2022)   Hunger Vital Sign    Worried About Running Out of Food in the Last Year: Never true    Ran Out of Food in the Last Year: Never true  Transportation Needs: No Transportation Needs (07/14/2022)   PRAPARE - Hydrologist (Medical): No    Lack of Transportation (Non-Medical): No  Physical Activity: Inactive (09/30/2021)   Exercise Vital Sign    Days of Exercise per Week: 0 days    Minutes of Exercise per Session: 0 min  Stress: No Stress Concern Present (09/30/2021)   [place]    Feeling of Stress : Only a little  Social Connections: Moderately Isolated (09/30/2021)   Social Connection and Isolation  Panel [NHANES]    Frequency of Communication with Friends and Family: More than three times a week    Frequency of Social Gatherings with Friends and Family: More than three times a week    Attends Religious Services: 1 to 4 times per year    Active Member of [employer] or Organizations: No    Attends Music therapist: Never    Marital Status: Never married  Human resources officer Violence: Not on file    FAMILY HISTORY: Family History  Problem Relation Age of Onset   Diabetes Mother    Diabetes Father    Diabetes Brother    Colon cancer Neg Hx    Esophageal cancer Neg Hx    Pancreatic cancer Neg Hx    Stomach cancer Neg Hx    Liver disease Neg Hx    CAD Neg Hx     ALLERGIES:  has No Known Allergies.  MEDICATIONS:  Current Outpatient Medications  Medication Sig Dispense Refill   amLODipine (NORVASC) 10 MG tablet Take 1 tablet (10 mg total) by mouth daily. 30 tablet 3   atorvastatin (LIPITOR) 20 MG tablet Take 1 tablet (20 mg total) by mouth daily. 90 tablet 3   Blood Pressure Monitoring (OMRON 3 SERIES BP MONITOR) DEVI 1 each by Does not apply route daily. 1 each 0   carvedilol (COREG) 25 MG tablet Take 1.5 tablets (37.5 mg total) by mouth 2 (two) times daily. 90 tablet 3   ferrous sulfate 325 (65 FE) MG EC tablet Take 325 mg by mouth daily with breakfast.     hydrALAZINE (APRESOLINE) 100 MG tablet Take 1 tablet (100 mg total) by mouth 2 (two) times daily. 60 tablet 3   hydrOXYzine (ATARAX) 25 MG tablet Take 1 tablet (25 mg total) by mouth at bedtime as needed. 30 tablet 1   ibuprofen (ADVIL) 600 MG tablet Take 1 tablet (600 mg total) by mouth every 6 (six) hours as needed. 30 tablet 0   insulin glargine (LANTUS) 100 UNIT/ML Solostar Pen Inject 10 Units into the skin at bedtime. 9 mL 3   isosorbide mononitrate (IMDUR) 60 MG 24 hr tablet TAKE ONE TABLET BY MOUTH ONCE DAILY 30 tablet 3   Naphazoline HCl (CLEAR EYES OP) Place 1 drop into both eyes as needed (irritation).      pregabalin (LYRICA) 50 MG capsule Take 1 capsule by mouth twice daily 60 capsule 0   torsemide 40 MG TABS Take 80 mg by mouth 2 (two) times daily.     No current facility-administered medications for this visit.    REVIEW OF SYSTEMS:   Constitutional: ( - ) fevers, ( - )  chills , ( - )  night sweats Eyes: ( - ) blurriness of vision, ( - ) double vision, ( - ) watery eyes Ears, nose, mouth, throat, and face: ( - ) mucositis, ( - ) sore throat Respiratory: ( - ) cough, ( - ) dyspnea, ( - ) wheezes Cardiovascular: ( - ) palpitation, ( - ) chest discomfort, ( - ) lower extremity swelling Gastrointestinal:  ( - ) nausea, ( - ) heartburn, ( - ) change in bowel habits Skin: ( - ) abnormal skin rashes Lymphatics: ( - ) new lymphadenopathy, ( - ) easy bruising Neurological: ( - ) numbness, ( - ) tingling, ( - ) new weaknesses Behavioral/Psych: ( - ) mood change, ( - ) new changes  All other systems were reviewed with the patient and are negative.  PHYSICAL EXAMINATION: ECOG PERFORMANCE STATUS: {CHL ONC ECOG [id number]}  There were no vitals filed for this visit. There were no vitals filed for this visit.  GENERAL: well appearing *** in NAD  SKIN: skin color, texture, turgor are normal, no rashes or significant lesions EYES: conjunctiva are pink and non-injected, sclera clear OROPHARYNX: no exudate, no erythema; lips, buccal mucosa, and tongue normal  NECK: supple, non-tender LYMPH:  no palpable lymphadenopathy in the cervical, axillary or supraclavicular lymph nodes.  LUNGS: clear to auscultation and percussion with normal breathing effort HEART: regular rate & rhythm and no murmurs and no lower extremity edema ABDOMEN: soft, non-tender, non-distended, normal bowel sounds Musculoskeletal: no cyanosis of digits and no clubbing  PSYCH: alert & oriented x 3, fluent speech NEURO: no focal motor/sensory deficits  LABORATORY DATA:  I have reviewed the data as listed    Latest Ref Rng &  Units 02/21/2022    2:04 PM 01/26/2022    2:09 AM 01/25/2022    4:33 AM  CBC  WBC 4.0 - 10.5 K/uL 3.3  5.3  4.2   Hemoglobin 13.0 - 17.0 g/dL 9.5  9.4  9.4   Hematocrit 39.0 - 52.0 % 30.1  29.2  30.7   Platelets 150 - 400 K/uL 164  146  144        Latest Ref Rng & Units 06/29/2022   12:28 PM 06/24/2022   12:59 PM 06/21/2022   12:19 PM  CMP  Glucose 70 - 99 mg/dL 89  85  90   BUN 6 - 20 mg/dL 56  58  59   Creatinine 0.61 - 1.24 mg/dL 4.37  4.41  4.49   Sodium 135 - 145 mmol/L 142  145  142   Potassium 3.5 - 5.1 mmol/L 4.5  4.7  4.5   Chloride 98 - 111 mmol/L 116  113  115   CO2 22 - 32 mmol/L 20  18  19    Calcium 8.9 - 10.3 mg/dL 8.1  8.1  8.2      PATHOLOGY: ***  BLOOD FILM: *** Review of the peripheral blood smear showed normal appearing white cells with neutrophils that were appropriately lobated and granulated. There was no predominance of bi-lobed or hyper-segmented neutrophils appreciated. No Dohle bodies were noted. There was no left shifting, immature forms or blasts noted. Lymphocytes remain normal in size without any predominance of large granular lymphocytes. Red cells show no anisopoikilocytosis, macrocytes , microcytes or polychromasia. There were no schistocytes, target cells, echinocytes, acanthocytes, dacrocytes, or stomatocytes.There was no rouleaux formation, nucleated red cells, or intra-cellular inclusions noted. The platelets are normal in size, shape, and color without any clumping evident.  RADIOGRAPHIC STUDIES: I have personally  reviewed the radiological images as listed and agreed with the findings in the report. No results found.  ASSESSMENT & PLAN ***  No orders of the defined types were placed in this encounter.   All questions were answered. The patient knows to call the clinic with any problems, questions or concerns.  I have spent a total of {CHL ONC TIME VISIT - [id number] minutes of face-to-face and non-face-to-face time, preparing to  see the patient, obtaining and/or reviewing separately obtained history, performing a medically appropriate examination, counseling and educating the patient, ordering medications/tests/procedures, referring and communicating with other health care professionals, documenting clinical information in the electronic health record, independently interpreting results and communicating results to the patient, and care coordination.   Dede Query, PA-C Department of Hematology/Oncology [place] at [facility] Phone: [phone]

## 2022-09-29 ENCOUNTER — Other Ambulatory Visit: Payer: Medicaid Other | Admitting: *Deleted

## 2022-09-29 NOTE — Patient Instructions (Signed)
Visit Information  [name]  - as a part of your Medicaid benefit, you are eligible for care management and care coordination services at no cost or copay. I was unable to reach you by phone today but would be happy to help you with your health related needs. Please feel free to call me @ [phone].   A member of the Managed Medicaid care management team will reach out to you again over the next 7 days.   Lurena Joiner RN, [name] [place] [facility] RN Care Coordinator [phone]

## 2022-09-29 NOTE — Patient Outreach (Signed)
  Medicaid Managed Care   Unsuccessful Attempt Note   09/29/2022 Name: Todd Mccoy MRN: [id number] DOB: 1970/12/10  Referred by: [name], MD Reason for referral : High Risk Managed Medicaid (Unsuccessful RNCM follow up telephone outreach)   An unsuccessful telephone outreach was attempted today. The patient was referred to the case management team for assistance with care management and care coordination.    Follow Up Plan: A HIPAA compliant phone message was left for the patient providing contact information and requesting a return call. and The Managed Medicaid care management team will reach out to the patient again over the next 7 days.    Lurena Joiner RN, [name] [place] [facility] RN Care Coordinator [phone]

## 2022-10-04 ENCOUNTER — Telehealth: Payer: Self-pay

## 2022-10-04 DIAGNOSIS — Z419 Encounter for procedure for purposes other than remedying health state, unspecified: Secondary | ICD-10-CM | POA: Diagnosis not present

## 2022-10-04 NOTE — Telephone Encounter (Signed)
..   Medicaid Managed Care   Unsuccessful Outreach Note  10/04/2022 Name: Todd Mccoy MRN: [id number] DOB: 1970-07-18  Referred by: [name], MD Reason for referral : Appointment (I called to reschedule his missed phone appointment with the MM RNCM.)   A second unsuccessful telephone outreach was attempted today. The patient was referred to the case management team for assistance with care management and care coordination.   Follow Up Plan: A HIPAA compliant phone message was left for the patient providing contact information and requesting a return call.  The care management team will reach out to the patient again over the next 7 days.   [place]  [phone]

## 2022-10-05 ENCOUNTER — Ambulatory Visit: Payer: Medicaid Other | Attending: Nurse Practitioner

## 2022-10-05 DIAGNOSIS — N184 Chronic kidney disease, stage 4 (severe): Secondary | ICD-10-CM

## 2022-10-05 DIAGNOSIS — I5042 Chronic combined systolic (congestive) and diastolic (congestive) heart failure: Secondary | ICD-10-CM | POA: Diagnosis not present

## 2022-10-05 DIAGNOSIS — I10 Essential (primary) hypertension: Secondary | ICD-10-CM

## 2022-10-06 ENCOUNTER — Telehealth: Payer: Self-pay

## 2022-10-06 ENCOUNTER — Other Ambulatory Visit: Payer: Self-pay | Admitting: Family Medicine

## 2022-10-06 ENCOUNTER — Other Ambulatory Visit: Payer: Self-pay

## 2022-10-06 LAB — BASIC METABOLIC PANEL
BUN/Creatinine Ratio: 13 (ref 9–20)
BUN: 72 mg/dL — ABNORMAL HIGH (ref 6–24)
CO2: 17 mmol/L — ABNORMAL LOW (ref 20–29)
Calcium: 8 mg/dL — ABNORMAL LOW (ref 8.7–10.2)
Chloride: 109 mmol/L — ABNORMAL HIGH (ref 96–106)
Creatinine, Ser: 5.35 mg/dL — ABNORMAL HIGH (ref 0.76–1.27)
Glucose: 68 mg/dL — ABNORMAL LOW (ref 70–99)
Potassium: 4.7 mmol/L (ref 3.5–5.2)
Sodium: 141 mmol/L (ref 134–144)
eGFR: 12 mL/min/{1.73_m2} — ABNORMAL LOW (ref >59)

## 2022-10-06 MED ORDER — TORSEMIDE 40 MG PO TABS: ORAL_TABLET | ORAL | 3 refills | Status: DC

## 2022-10-06 NOTE — Telephone Encounter (Signed)
-----   Message from [name], NP sent at 10/06/2022  7:16 AM EDT ----- Please let Todd Mccoy know that his renal function is abnormal and potassium is within normal limits.  Current labs show that you may be dehydrated from your diuretic therapy.  Please make sure to drink at least 64 ounces of fluid per day while taking diuretic therapy.  Please make sure to keep your follow-up with your nephrologist as discussed during your appointment.  Please advise patient to reduce torsemide to 80 mg daily and make sure to maintain daily weights.  Please let me know if you have any additional questions.  Ambrose Pancoast, NP

## 2022-10-06 NOTE — Telephone Encounter (Signed)
Called patient to give results of recent labs. Instructed to decrease Torsemide to 80 mg daily to take 2 40 mg tablets by mouth once a day, make sure he is drinking plenty of fluids at least 64 ounces a day. To keep is nephrologist (kidney) appointment as discussed at office visit. Patient stated that missed his kidney doctor appointment due to forgetting about it and having so many appointments to keep up with. When patient was asked if he understood how to now take the torsemide medication. He stated that he is to take it 40 mg 2 times a day. I re-informed Mr. Ouellette to take 2 of the 40 mg tablets equaling 80 mg once a day to call the kidney doctor's office to get his missed appointment rescheduled as soon as possible as it is important to keep those appointments and to give our office a call if he any questions. Patient verbalized understanding of how to take medication, to stay adequately hydrated by drinking at least 64 fluid ounces per day and to get his kidney doctor appointment rescheduled.

## 2022-10-09 ENCOUNTER — Emergency Department (HOSPITAL_COMMUNITY)
Admission: EM | Admit: 2022-10-09 | Discharge: 2022-10-09 | Disposition: A | Discharge status: Home / Self Care | Payer: Medicaid Other | Attending: Emergency Medicine | Admitting: Emergency Medicine

## 2022-10-09 ENCOUNTER — Encounter (HOSPITAL_COMMUNITY): Payer: Self-pay

## 2022-10-09 ENCOUNTER — Other Ambulatory Visit: Payer: Self-pay

## 2022-10-09 DIAGNOSIS — N189 Chronic kidney disease, unspecified: Secondary | ICD-10-CM | POA: Insufficient documentation

## 2022-10-09 DIAGNOSIS — I129 Hypertensive chronic kidney disease with stage 1 through stage 4 chronic kidney disease, or unspecified chronic kidney disease: Secondary | ICD-10-CM | POA: Insufficient documentation

## 2022-10-09 DIAGNOSIS — R21 Rash and other nonspecific skin eruption: Secondary | ICD-10-CM

## 2022-10-09 DIAGNOSIS — Z794 Long term (current) use of insulin: Secondary | ICD-10-CM | POA: Insufficient documentation

## 2022-10-09 DIAGNOSIS — L282 Other prurigo: Secondary | ICD-10-CM | POA: Insufficient documentation

## 2022-10-09 DIAGNOSIS — E119 Type 2 diabetes mellitus without complications: Secondary | ICD-10-CM | POA: Diagnosis not present

## 2022-10-09 MED ORDER — CETIRIZINE HCL 10 MG PO TABS: 10.0000 mg | ORAL_TABLET | Freq: Every day | ORAL | 0 refills | Status: DC

## 2022-10-09 MED ORDER — ACYCLOVIR 5 % EX OINT: 1.0000 | TOPICAL_OINTMENT | CUTANEOUS | 0 refills | Status: DC

## 2022-10-09 NOTE — ED Provider Notes (Signed)
[place] [facility] Provider [facility]   CSN: [id number] Arrival date & time: 10/09/22  1419     History  Chief Complaint  Patient presents with   Rash    Todd Mccoy is a 52 y.o. male.  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  Over the past several months has had an intermittent rash on his right temple.  Says that it will come for several days and then typically resolves.  Says it is painful and blistering.  Also itches.  Denies any eye pain or facial paralysis.  Over the past 3 weeks has persisted.  Has not yet seen a dermatologist or his primary doctor and wanted to be checked out today.  Denies any ear pain tinnitus or hearing loss.       Home Medications Prior to Admission medications   Medication Sig Start Date End Date Taking? Authorizing Provider  acyclovir ointment (ZOVIRAX) 5 % Apply 1 Application topically every 3 (three) hours. 10/09/22  Yes Rondel BatonPaterson, Jerik Falletta C, MD  cetirizine (ZYRTEC ALLERGY) 10 MG tablet Take 1 tablet (10 mg total) by mouth daily. 10/09/22 11/08/22 Yes Rondel BatonPaterson, Rosemarie Galvis C, MD  amLODipine (NORVASC) 10 MG tablet Take 1 tablet (10 mg total) by mouth daily. 03/04/22   Quintella Reicherturner, Traci R, MD  atorvastatin (LIPITOR) 20 MG tablet Take 1 tablet (20 mg total) by mouth daily. 06/14/22   Quintella Reicherturner, Traci R, MD  Blood Pressure Monitoring (OMRON 3 SERIES BP MONITOR) DEVI 1 each by Does not apply route daily. 04/08/22   Quintella Reicherturner, Traci R, MD  carvedilol (COREG) 25 MG tablet Take 1.5 tablets (37.5 mg total) by mouth 2 (two) times daily. 06/17/22   Gaston Islamick, Ernest H Jr., NP  ferrous sulfate 325 (65 FE) MG EC tablet Take 325 mg by mouth daily with breakfast.    [provider]  hydrALAZINE (APRESOLINE) 100 MG tablet Take 1 tablet (100 mg total) by mouth 2 (two) times daily. 09/21/22   Gaston Islamick, Ernest H Jr.,  NP  hydrOXYzine (ATARAX) 25 MG tablet Take 1 tablet (25 mg total) by mouth at bedtime as needed. 02/24/22   Hoy RegisterNewlin, [name], MD  ibuprofen (ADVIL) 600 MG tablet Take 1 tablet (600 mg total) by mouth every 6 (six) hours as needed. 03/11/22   Fayrene Helperran, [place], PA-C  insulin glargine (LANTUS) 100 UNIT/ML Solostar Pen Inject 10 Units into the skin at bedtime. 02/24/22 02/24/23  Hoy RegisterNewlin, [name], MD  isosorbide mononitrate (IMDUR) 60 MG 24 hr tablet TAKE ONE TABLET BY MOUTH ONCE DAILY 08/19/22   Quintella Reicherturner, Traci R, MD  Naphazoline HCl (CLEAR EYES OP) Place 1 drop into both eyes as needed (irritation).    [provider]  pregabalin (LYRICA) 50 MG capsule Take 1 capsule by mouth twice daily 08/30/22   Hoy RegisterNewlin, [name], MD  Torsemide 40 MG TABS Take 80 mg (2 tablets) by mouth once a day 10/06/22   Gaston Islamick, Ernest H Jr., NP  insulin aspart protamine- aspart (NOVOLOG MIX 70/30) (70-30) 100 UNIT/ML injection Inject 0.35 mLs (35 Units total) into the skin 2 (two) times daily. 04/17/20 05/13/20  Anders SimmondsMcClung, Angela M, PA-C  metFORMIN (GLUCOPHAGE-XR) 500 MG 24 hr tablet TAKE 1 TABLET (500 MG TOTAL) BY MOUTH 2 (TWO) TIMES DAILY. 12/19/20 12/22/20  Kendell BaneShahmehdi, Seyed A, MD      Allergies    Patient has no known allergies.  Review of Systems   Review of Systems  Physical Exam Updated Vital Signs BP (!) 162/102 (BP Location: Left Arm)   Pulse 82   Temp 98.8 F (37.1 C) (Oral)   Resp 16   Ht 5\' 10"  (1.778 m)   Wt 94.8 kg   SpO2 100%   BMI 29.99 kg/m  Physical Exam HENT:     Head: Atraumatic.     Right Ear: Tympanic membrane, ear canal and external ear normal.     Left Ear: External ear normal.     Nose: Nose normal.     Comments: No Hutchinson sign    Mouth/Throat:     Mouth: Mucous membranes are moist.     Pharynx: Oropharynx is clear. No oropharyngeal exudate or posterior oropharyngeal erythema.  Eyes:     Extraocular Movements: Extraocular movements intact.     Conjunctiva/sclera: Conjunctivae normal.      Pupils: Pupils are equal, round, and reactive to light.  Skin:    Comments: See rash on right side of face.  Not present on neck, upper back or chest.  Neurological:     Mental Status: He is alert.    R face:    ED Results / Procedures / Treatments   Labs (all labs ordered are listed, but only abnormal results are displayed) Labs Reviewed - No data to display  EKG None  Radiology No results found.  Procedures Procedures   Medications Ordered in ED Medications - No data to display  ED Course/ Medical Decision Making/ A&P                             Medical Decision Making Risk OTC drugs. Prescription drug management.   Todd Mccoy is a 52 y.o. 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   Initial Ddx:  Zoster, contact dermatitis, allergic reaction  MDM:  Unclear was causing the patient's symptoms at this time.  Could potentially be zoster given the appearance and blistering nature of the rash.  Does not report using any creams or solutions that would be concerning for contact dermatitis.  Does not appear to be an allergic reaction.  No signs of Ramsay Hunt syndrome at this time.  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  Plan:  Acyclovir cream given his renal function Zyrtec for pruritus Dermatology and PCP follow-up  This patient presents to the ED for concern of complaints listed in HPI, this involves an extensive number of treatment options, and is a complaint that carries with it a high risk of complications and morbidity. Disposition including potential need for admission considered.   Dispo: DC Home. Return precautions discussed including, but not limited to, those listed in the AVS. Allowed pt time to ask  questions which were answered fully prior to dc.  Additional history obtained from son Records reviewed Outpatient Clinic Notes I have reviewed the patients home medications and made adjustments as needed Social Determinants of health:  Elderly   Final Clinical Impression(s) / ED Diagnoses Final diagnoses:  Rash  Pruritic rash    Rx / DC Orders ED Discharge Orders          Ordered    acyclovir ointment (ZOVIRAX) 5 %  Every  3 hours        10/09/22 1526    cetirizine (ZYRTEC ALLERGY) 10 MG tablet  Daily        10/09/22 1526              Rondel BatonPaterson, Wynn Alldredge C, MD 10/09/22 [id number]

## 2022-10-09 NOTE — ED Triage Notes (Addendum)
Pt coming today complaining of bumps on the right side of his head. Endorses increasing pain, and intermittent itching. States they have they appeared a couple months ago, disappeared and began apprx a week ago.

## 2022-10-09 NOTE — ED Notes (Signed)
Pt and visitor verbalized understanding of d/c instructions, prescriptions and follow up care.

## 2022-10-09 NOTE — Discharge Instructions (Signed)
You were seen for your rash in the emergency department.   At home, please use the acyclovir cream in case the rash is from a virus.  Please use the Zyrtec prescribed you for any itching that you may have and use Tylenol for any pain.    Follow-up as soon as possible with a dermatologist.  Follow-up with your primary doctor in 2-3 days regarding your visit.    Return immediately to the emergency department if you experience any of the following: Severe headache, confusion, fevers, or any other concerning symptoms.    Thank you for visiting our Emergency Department. It was a pleasure taking care of you today.

## 2022-10-11 ENCOUNTER — Telehealth: Payer: Self-pay

## 2022-10-11 NOTE — Transitions of Care (Post Inpatient/ED Visit) (Signed)
   10/11/2022  Name: Todd Mccoy MRN: [id number] DOB: 12-07-1970  Today's TOC FU Call Status: Today's TOC FU Call Status:: Unsuccessul Call (1st Attempt) Unsuccessful Call (1st Attempt) Date: 10/11/22  Attempted to reach the patient regarding the most recent Inpatient/ED visit.  Follow Up Plan: Additional outreach attempts will be made to reach the patient to complete the Transitions of Care (Post Inpatient/ED visit) call.   Abelino Derrick, MHA [facility]  Managed [facility] Social Worker [phone]

## 2022-10-12 ENCOUNTER — Telehealth: Payer: Self-pay

## 2022-10-12 NOTE — Transitions of Care (Post Inpatient/ED Visit) (Signed)
   10/12/2022  Name: Todd Mccoy MRN: [id number] DOB: 03/18/1971  Today's TOC FU Call Status: Today's TOC FU Call Status:: Unsuccessful Call (2nd Attempt) Unsuccessful Call (2nd Attempt) Date: 10/12/22  Attempted to reach the patient regarding the most recent Inpatient/ED visit.  Follow Up Plan: Additional outreach attempts will be made to reach the patient to complete the Transitions of Care (Post Inpatient/ED visit) call.   Abelino Derrick, MHA [facility]  Managed [facility] Social Worker [phone]

## 2022-10-15 ENCOUNTER — Other Ambulatory Visit: Payer: Self-pay | Admitting: Physician Assistant

## 2022-10-15 ENCOUNTER — Other Ambulatory Visit: Payer: Medicaid Other | Admitting: *Deleted

## 2022-10-15 NOTE — Telephone Encounter (Signed)
Rx for amlodipine sent to the pts confirmed pharmacy of choice.

## 2022-10-15 NOTE — Transitions of Care (Post Inpatient/ED Visit) (Signed)
   10/15/2022  Name: Todd Mccoy MRN: [id number] DOB: 03/01/1971  Today's TOC FU Call Status: Today's TOC FU Call Status:: Successful TOC FU Call Competed TOC FU Call Complete Date: 10/15/22  Transition Care Management Follow-up Telephone Call Date of Discharge: 10/09/22 Discharge Facility: [facility]) Type of Discharge: Emergency Department Reason for ED Visit: Other: (Rash) How have you been since you were released from the hospital?: Better Any questions or concerns?: No  Items Reviewed: Did you receive and understand the discharge instructions provided?: Yes Medications obtained and verified?: Yes (Medications Reviewed) Any new allergies since your discharge?: No Dietary orders reviewed?: NA Do you have support at home?: Yes People in Home: other relative(s) Name of Support/Comfort Primary Source: [name]  Home Care and Equipment/Supplies: Were Home Health Services Ordered?: NA Any new equipment or medical supplies ordered?: NA  Functional Questionnaire: Do you need assistance with bathing/showering or dressing?: No Do you need assistance with meal preparation?: No Do you need assistance with eating?: No Do you have difficulty maintaining continence: No Do you need assistance with getting out of bed/getting out of a chair/moving?: No Do you have difficulty managing or taking your medications?: Yes  Follow up appointments reviewed: PCP Follow-up appointment confirmed?: Yes Date of PCP follow-up appointment?: 10/27/22 Follow-up Provider: PCP Specialist Hospital Follow-up appointment confirmed?: No Reason Specialist Follow-Up Not Confirmed: Patient has Specialist Provider Number and will Call for Appointment Do you need transportation to your follow-up appointment?: No Do you understand care options if your condition(s) worsen?: Yes-patient verbalized understanding  SDOH Interventions Today    Flowsheet Row Most Recent Value  SDOH Interventions    Transportation Interventions Intervention Not Indicated      RNCM assisted with contacting [facility] to inquire about Amlodipine(patient last received 90 day supply on 03/17/22). [facility] will fill prescription and have ready for pick up today. Patient aware and will pick up medication today.  Estanislado Emms RN, [name] [place]  Managed [facility] RN Care Coordinator [phone]

## 2022-10-15 NOTE — Telephone Encounter (Signed)
-----   Message from Quintella Reichert, MD sent at 10/15/2022  2:20 PM EDT ----- Regarding: RE: refill Please take care of this ----- Message ----- From: Heidi Dach, RN Sent: 10/15/2022   1:49 PM EDT To: Quintella Reichert, MD; Gaston Islam., NP Subject: refill                                         Hi,   This patient is needing Amlodipine refill sent to [facility]. He had elevated BP during recent ED visit on 10/09/22. I contacted [facility] to request Amlodipine refill-he last received September 2023 for a 90 day supply, refill needs updated. He has a follow up with Dr. Mayford Knife 12/29/22.  Thank you,  Estanislado Emms RN, [name] [place]  Managed [facility] RN Care Coordinator [phone]

## 2022-10-23 ENCOUNTER — Other Ambulatory Visit: Payer: Self-pay | Admitting: Family Medicine

## 2022-10-25 ENCOUNTER — Telehealth: Payer: Self-pay

## 2022-10-25 NOTE — Telephone Encounter (Signed)
Requested medication (s) are due for refill today: Yes  Requested medication (s) are on the active medication list: Yes  Last refill:  08/30/22  Future visit scheduled: Yes  Notes to clinic:  See request.    Requested Prescriptions  Pending Prescriptions Disp Refills   pregabalin (LYRICA) 50 MG capsule [Pharmacy Med Name: Pregabalin 50 MG Oral Capsule] 60 capsule 0    Sig: Take 1 capsule by mouth twice daily     Not Delegated - Neurology:  Anticonvulsants - Controlled - pregabalin Failed - 10/23/2022  8:58 AM      Failed - This refill cannot be delegated      Failed - Cr in normal range and within 360 days    Creatinine, Ser  Date Value Ref Range Status  10/05/2022 5.35 (H) 0.76 - 1.27 mg/dL Final   Creatinine, Urine  Date Value Ref Range Status  02/27/2021 131.25 mg/dL Final         Passed - Completed PHQ-2 or PHQ-9 in the last 360 days      Passed - Valid encounter within last 12 months    Recent Outpatient Visits           8 months ago 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 complication, unspecified whether long term insulin use (HCC)   [facility] [name], MD   1 year ago Other insomnia   [facility] [place], Odette Horns, MD   1 year ago 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 complication, unspecified whether long term insulin use (HCC)   [facility] [place], [place], MD   1 year ago 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 complication, unspecified whether long term insulin use (HCC)   [facility] [place], Odette Horns, MD   2 years ago 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

## 2022-10-25 NOTE — Telephone Encounter (Signed)
Copied from CRM [id number]. Topic: Referral - Status >> Oct 25, 2022 10:53 AM Epimenio Foot F wrote: Reason for CRM: Pt is calling in to check on the status of his referral to the dermatologist. Pt wants to know if the referral was sent and who it was sent to. Please advise.

## 2022-10-26 NOTE — Telephone Encounter (Signed)
Pt has appointment tomorrow.

## 2022-10-27 ENCOUNTER — Telehealth: Payer: Self-pay | Admitting: Family Medicine

## 2022-10-27 ENCOUNTER — Ambulatory Visit: Payer: Medicaid Other | Attending: Family Medicine | Admitting: Family Medicine

## 2022-10-27 VITALS — BP 128/80 | HR 55

## 2022-10-27 DIAGNOSIS — I5023 Acute on chronic systolic (congestive) heart failure: Secondary | ICD-10-CM

## 2022-10-27 DIAGNOSIS — I739 Peripheral vascular disease, unspecified: Secondary | ICD-10-CM | POA: Diagnosis not present

## 2022-10-27 DIAGNOSIS — Z89512 Acquired absence of left leg below knee: Secondary | ICD-10-CM

## 2022-10-27 DIAGNOSIS — N184 Chronic kidney disease, stage 4 (severe): Secondary | ICD-10-CM

## 2022-10-27 DIAGNOSIS — R21 Rash and other nonspecific skin eruption: Secondary | ICD-10-CM

## 2022-10-27 DIAGNOSIS — F17210 Nicotine dependence, cigarettes, uncomplicated: Secondary | ICD-10-CM

## 2022-10-27 DIAGNOSIS — Z7985 Long-term (current) use of injectable non-insulin antidiabetic drugs: Secondary | ICD-10-CM | POA: Diagnosis not present

## 2022-10-27 DIAGNOSIS — I129 Hypertensive chronic kidney disease with stage 1 through stage 4 chronic kidney disease, or unspecified chronic kidney disease: Secondary | ICD-10-CM | POA: Diagnosis not present

## 2022-10-27 DIAGNOSIS — E11628 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 complications: Secondary | ICD-10-CM | POA: Diagnosis not present

## 2022-10-27 DIAGNOSIS — E1122 Type 2 diabetes mellitus with diabetic chronic kidney disease: Secondary | ICD-10-CM | POA: Diagnosis not present

## 2022-10-27 LAB — POCT GLYCOSYLATED HEMOGLOBIN (HGB A1C): HbA1c, POC (controlled diabetic range): 6.9 % (ref 0.0–7.0)

## 2022-10-27 LAB — POCT ABI - SCREENING FOR PILOT NO CHARGE

## 2022-10-27 NOTE — Patient Instructions (Signed)
Please call to schedule your mammogram and/or bone density: [facility]  Address: [address], [place], [place] [address] Phone: [phone]  [facility] [place] [address] [place],  [place]  [address] Phone: [phone]    Heart Failure and Exercise Heart failure is a condition in which the heart does not fill or pump enough blood and oxygen to support your body and its functions. Heart failure is a long-term (chronic) condition. Living with heart failure can be challenging. However, following your health care provider's instructions about a healthy lifestyle may help improve your symptoms. This includes choosing the right exercise plan. Doing daily physical activity is important after a diagnosis of heart failure. You may have some activity restrictions, so talk to your health care provider before doing any exercises. What are the benefits of exercise? Exercise may: Make your heart muscles stronger. Lower your blood pressure and cholesterol. Help you lose weight. Help your bones stay strong. Improve your blood circulation. Help your body use oxygen better. This relieves symptoms such as fatigue and shortness of breath. Help your mental health by lowering the risk of depression and other problems. Improve your quality of life. Decrease your chance of hospital admission for heart failure. What is an exercise plan? An exercise plan is a set of specific exercises and training activities. You will work with your health care provider to create the exercise plan that works for you. The plan may include: Different types of exercises and how to do them. Cardiac rehabilitation exercises. These are supervised programs that are designed to strengthen your heart. What are strengthening exercises? Strengthening exercises are a type of physical activity that involves using resistance to improve your muscle strength.  Strengthening exercises usually have repetitive motions. These types of exercises can include: Lifting weights. Using weight machines. Using resistance tubes and bands. Using kettlebells. Using your body weight, such as doing push-ups or squats. What are balance exercises? Balance exercises are another type of physical activity. They strengthen the muscles of the back, abdomen, and pelvis (core muscles) and improve your balance. They can also lower your risk of falling. These types of exercises can include: Standing on one leg. Walking backward, sideways, and in a straight line. Standing up after sitting, without using your hands. Shifting your weight from one leg to the other. Lifting one leg in front of you. Doing tai chi. This is a type of exercise that uses slow movements and deep breathing. How can I increase my flexibility? Having better flexibility can keep you from falling. It can also lengthen your muscles, improve your range of motion, and help your joints. You can increase your flexibility by: Doing tai chi. Doing yoga. Stretching. How much aerobic exercise should I get?  Aerobic exercise strengthens your breathing and circulation system and increases your body's use of oxygen. This type of exercise causes your heart to beat faster while you are doing it. Examples of aerobic exercise include biking, walking, running, and swimming. Talk to your health care provider to find out how much aerobic exercise is safe for you. To do this type of exercise: Start exercising slowly, limiting the amount of time at first. You may need to start with 5 minutes of aerobic exercise every day. Slowly add more minutes until you can safely do at least 30 minutes of exercise at least 5 days a week. Summary Daily physical activity is important after a diagnosis of heart failure.   Exercise can make your heart muscles stronger. It also offers other benefits that will improve your health. Exercise can  decrease your chance of hospital admission for heart failure. Talk to your health care provider before doing any exercises. This information is not intended to replace advice given to you by your health care provider. Make sure you discuss any questions you have with your health care provider. Document Revised: 09/29/2021 Document Reviewed: 02/04/2020 Elsevier Patient Education  2023 Elsevier Inc.  

## 2022-10-27 NOTE — Progress Notes (Signed)
RIGHTSwelling in right leg.

## 2022-10-27 NOTE — Progress Notes (Signed)
Subjective:  Patient ID: Todd Mccoy, male    DOB: 04/04/1971  Age: 52 y.o. MRN: [id number]  CC: Diabetes   HPI Todd Mccoy is a 52 y.o. 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  Interval History:  He had a visit with cardiology last month and blood pressure was elevated, hydralazine dose increased.  Torsemide was also increased temporarily due to being fluid overloaded. 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  He Complains of edema in his right thigh and it feels hard as well.  Endorses adherence with torsemide. Denies presence of dyspnea or chest pain. Also under the care of nephrology but he is not sure of his next appointment.  A1c is 6.9 and he has not had any hypoglycemia.  Remains on 10 units of Lantus at bedtime. He has nonpruritic rash on his right temporal region and is requesting dermatology referral. Past Medical History:  Diagnosis Date   Anemia    Anemia    Anxiety    Chronic combined systolic (congestive) and diastolic (congestive) heart failure    EF 40-45% with G3DD on echo 2022   CKD (chronic kidney disease), stage IV 12/10/2020   Diabetes mellitus with complication    Diabetic neuropathy    Diabetic retinal damage of both eyes 03/25/2020   pt states retinal eye damage- left worse than the right- recent visited MD    Diabetic retinal damage of both eyes 03/25/2020   pt states recent MD visit /left eye worse than right   Dyslipidemia 12/10/2020   Hx of BKA, left    Hypertension    Morbid obesity    Osteomyelitis    Pneumonia     Past Surgical History:  Procedure  Laterality Date   ABSCESS DRAINAGE     neck   AMPUTATION Left 11/14/2020   Procedure: LEFT 5TH RAY AMPUTATION;  Surgeon: Nadara Mustard, MD;  Location: [facility] OR;  Service: Orthopedics;  Laterality: Left;   AMPUTATION Left 12/10/2020   Procedure: LEFT BELOW KNEE AMPUTATION;  Surgeon: Nadara Mustard, MD;  Location[facility] OR;  Service: Orthopedics;  Laterality: Left;   AMPUTATION TOE Right 03/25/2020   Procedure: AMPUTATION TOE;  Surgeon: Park Liter, DPM;  Location: [facility] ORS;  Service: Podiatry;  Laterality: Right;   INCISION AND DRAINAGE ABSCESS Right 09/07/2014   Procedure: INCISION AND DRAINAGE ABSCESS RIGHT FLANK;  Surgeon: Avel Peace, MD;  Location: [facility] ORS;  Service: General;  Laterality: Right;   LEG AMPUTATION BELOW KNEE Left 12/10/2020   SCROTUM EXPLORATION      Family History  Problem Relation Age of Onset   Diabetes Mother    Diabetes Father    Diabetes Brother    Colon cancer Neg Hx    Esophageal cancer Neg Hx    Pancreatic cancer Neg Hx    Stomach cancer Neg Hx    Liver disease Neg Hx    CAD Neg Hx     Social History   Socioeconomic History   Marital status: Single    Spouse name: Not on file   Number of children: 1   Years of education: Not on  file   Highest education level: Not on file  Occupational History   Not on file  Tobacco Use   Smoking status: Some Days    Packs/day: 1.00    Years: 32.00    Additional pack years: 0.00    Total pack years: 32.00    Types: Cigarettes    Last attempt to quit: 08/24/2021    Years since quitting: 1.1    Passive exposure: Past   Smokeless tobacco: Former   Tobacco comments:    Quit smoking previously February 2023  Vaping Use   Vaping Use: Never used  Substance and Sexual Activity   Alcohol use: No   Drug use: No   Sexual activity: Not on file  Other Topics Concern   Not on file  Social History Narrative   Not on file   Social Determinants of Health   Financial Resource Strain: Low Risk  (09/30/2021)   Overall  Financial Resource Strain (CARDIA)    Difficulty of Paying Living Expenses: Not hard at all  Food Insecurity: No Food Insecurity (07/14/2022)   Hunger Vital Sign    Worried About Running Out of Food in the Last Year: Never true    Ran Out of Food in the Last Year: Never true  Transportation Needs: No Transportation Needs (10/15/2022)   PRAPARE - Administrator, Civil Service (Medical): No    Lack of Transportation (Non-Medical): No  Physical Activity: Inactive (09/30/2021)   Exercise Vital Sign    Days of Exercise per Week: 0 days    Minutes of Exercise per Session: 0 min  Stress: No Stress Concern Present (09/30/2021)   [employer] of Occupational Health - Occupational Stress Questionnaire    Feeling of Stress : Only a little  Social Connections: Moderately Isolated (09/30/2021)   Social Connection and Isolation Panel [NHANES]    Frequency of Communication with Friends and Family: More than three times a week    Frequency of Social Gatherings with Friends and Family: More than three times a week    Attends Religious Services: 1 to 4 times per year    Active Member of [employer] or Organizations: No    Attends Banker Meetings: Never    Marital Status: Never married    No Known Allergies  Outpatient Medications Prior to Visit  Medication Sig Dispense Refill   acyclovir ointment (ZOVIRAX) 5 % Apply 1 Application topically every 3 (three) hours. 15 g 0   amLODipine (NORVASC) 10 MG tablet TAKE ONE TABLET BY MOUTH ONCE DAILY 90 tablet 2   atorvastatin (LIPITOR) 20 MG tablet Take 1 tablet (20 mg total) by mouth daily. 90 tablet 3   Blood Pressure Monitoring (OMRON 3 SERIES BP MONITOR) DEVI 1 each by Does not apply route daily. 1 each 0   carvedilol (COREG) 25 MG tablet Take 1.5 tablets (37.5 mg total) by mouth 2 (two) times daily. 90 tablet 3   cetirizine (ZYRTEC ALLERGY) 10 MG tablet Take 1 tablet (10 mg total) by mouth daily. 30 tablet 0   ferrous sulfate 325 (65  FE) MG EC tablet Take 325 mg by mouth daily with breakfast.     hydrALAZINE (APRESOLINE) 100 MG tablet Take 1 tablet (100 mg total) by mouth 2 (two) times daily. 60 tablet 3   hydrOXYzine (ATARAX) 25 MG tablet Take 1 tablet (25 mg total) by mouth at bedtime as needed. 30 tablet 1   ibuprofen (ADVIL) 600 MG tablet Take 1 tablet (600 mg  total) by mouth every 6 (six) hours as needed. 30 tablet 0   insulin glargine (LANTUS) 100 UNIT/ML Solostar Pen Inject 10 Units into the skin at bedtime. 9 mL 3   isosorbide mononitrate (IMDUR) 60 MG 24 hr tablet TAKE ONE TABLET BY MOUTH ONCE DAILY 30 tablet 3   Naphazoline HCl (CLEAR EYES OP) Place 1 drop into both eyes as needed (irritation).     pregabalin (LYRICA) 50 MG capsule Take 1 capsule by mouth twice daily 60 capsule 0   Torsemide 40 MG TABS Take 80 mg (2 tablets) by mouth once a day 60 tablet 3   No facility-administered medications prior to visit.     ROS Review of Systems  Constitutional:  Negative for activity change and appetite change.  HENT:  Negative for sinus pressure and sore throat.   Eyes:  Positive for visual disturbance.  Respiratory:  Negative for chest tightness, shortness of breath and wheezing.   Cardiovascular:  Negative for chest pain and palpitations.  Gastrointestinal:  Negative for abdominal distention, abdominal pain and constipation.  Genitourinary: Negative.   Musculoskeletal:  Positive for gait problem.  Skin:  Positive for rash.  Psychiatric/Behavioral:  Negative for behavioral problems and dysphoric mood.     Objective:  BP 128/80   Pulse (!) 55   SpO2 100%      10/27/2022    8:44 AM 10/09/2022    3:55 PM 10/09/2022    2:48 PM  BP/Weight  Systolic BP 128 162   Diastolic BP 80 102   Wt. (Lbs)   209  BMI   29.99 kg/m2      Physical Exam Constitutional:      Appearance: He is well-developed.  Cardiovascular:     Rate and Rhythm: Bradycardia present.     Heart sounds: Normal heart sounds. No murmur  heard. Pulmonary:     Effort: Pulmonary effort is normal.     Breath sounds: Rales present. No wheezing.  Chest:     Chest wall: No tenderness.  Abdominal:     General: Bowel sounds are normal. There is no distension.     Palpations: Abdomen is soft. There is no mass.     Tenderness: There is no abdominal tenderness.  Musculoskeletal:        General: Normal range of motion.     Right lower leg: Edema present.     Comments: Left BKA  Skin:    Comments: Cluster of furuncles in his right temporal region  Neurological:     Mental Status: He is alert and oriented to person, place, and time.  Psychiatric:        Mood and Affect: Mood normal.        Latest Ref Rng & Units 10/05/2022   10:35 AM 06/29/2022   12:28 PM 06/24/2022   12:59 PM  CMP  Glucose 70 - 99 mg/dL 68  89  85   BUN 6 - 24 mg/dL 72  56  58   Creatinine 0.76 - 1.27 mg/dL 4.09  8.11  9.14   Sodium 134 - 144 mmol/L 141  142  145   Potassium 3.5 - 5.2 mmol/L 4.7  4.5  4.7   Chloride 96 - 106 mmol/L 109  116  113   CO2 20 - 29 mmol/L Calcium 8.7 - 10.2 mg/dL 8.0  8.1  8.1     Lipid Panel     Component Value Date/Time   CHOL  82 (L) 05/21/2021 1057   TRIG 67 05/21/2021 1057   HDL 27 (L) 05/21/2021 1057   CHOLHDL 3.0 05/21/2021 1057   LDLCALC 40 05/21/2021 1057   LDLDIRECT 36 05/21/2021 1057    CBC    Component Value Date/Time   WBC 3.3 (L) 02/21/2022 1404   RBC 3.44 (L) 02/21/2022 1404   HGB 9.5 (L) 02/21/2022 1404   HGB 8.4 (L) 04/17/2020 1716   HCT 30.1 (L) 02/21/2022 1404   HCT 26.3 (L) 04/17/2020 1716   PLT 164 02/21/2022 1404   PLT 197 04/17/2020 1716   MCV 87.5 02/21/2022 1404   MCV 85 04/17/2020 1716   MCH 27.6 02/21/2022 1404   MCHC 31.6 02/21/2022 1404   RDW 16.7 (H) 02/21/2022 1404   RDW 14.8 04/17/2020 1716   LYMPHSABS 0.6 (L) 02/21/2022 1404   LYMPHSABS 1.9 04/17/2020 1716   MONOABS 0.4 02/21/2022 1404   EOSABS 0.1 02/21/2022 1404   EOSABS 0.1 04/17/2020 1716   BASOSABS  0.0 02/21/2022 1404   BASOSABS 0.0 04/17/2020 1716    Lab Results  Component Value Date   HGBA1C 6.9 10/27/2022    Assessment & Plan:  1. 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 complication, unspecified whether long term insulin use Controlled with A1c of 6.9 Continue current management Counseled on Diabetic diet, my plate method, 161 minutes of moderate intensity exercise/week Blood sugar logs with fasting goals of 80-120 mg/dl, random of less than 096 and in the event of sugars less than 60 mg/dl or greater than 045 mg/dl encouraged to notify the clinic. Advised on the need for annual eye exams, annual foot exams, Pneumonia vaccine. - POCT glycosylated hemoglobin (Hb A1C) - Ambulatory referral to Podiatry  2. Rash and nonspecific skin eruption - Ambulatory referral to Dermatology  3. Smoking greater than 20 pack years Spent 3 minutes counseling on cessation but he is not ready to quit at this time. - CT CHEST LUNG CANCER SCREENING LOW DOSE WO CONTRAST; Future  4. S/P BKA (below knee amputation) unilateral, left Stable  5. Acute on chronic systolic (congestive) heart failure EF of 38% from 01/2022. Currently in fluid overload CKD also contributing. Increase torsemide from 80 mg daily to 120 mg daily for the next 1 week then referred back to 80 mg daily Advised to contact cardiology if he has dyspnea  6. Hypertension associated with stage 4 chronic kidney disease due to type 2 diabetes mellitus Blood pressure is controlled Continue antihypertensives Counseled on blood pressure goal of less than 130/80, low-sodium, DASH diet, medication compliance, 150 minutes of moderate intensity exercise per week. Discussed medication compliance, adverse effects.  7. PAD (peripheral artery disease) This is a high risk patient with left BKA - POCT ABI Screening for Pilot No Charge - Ambulatory referral to Vascular Surgery    No orders of the defined types were placed in this  encounter.   Follow-up: Return in about 6 months (around 04/28/2023) for Chronic medical conditions.       [name], MD, FAAFP. [facility] [place], [name] [phone]   10/27/2022, 5:07 PM

## 2022-10-27 NOTE — Addendum Note (Signed)
Addended by: [name] on: 10/27/2022 05:36 PM   Modules accepted: Orders

## 2022-10-27 NOTE — Telephone Encounter (Signed)
Copied from CRM [id number]. Topic: General - Other >> Oct 27, 2022 11:33 AM Franchot Heidelberg wrote: Reason for CRM: Todd Mccoy from [facility] called to report that he must be seen at the hospital for radiology because he is wheelchair bound and cannot stand and pivot

## 2022-10-27 NOTE — Telephone Encounter (Signed)
CT scan needs to be changed to [facility].

## 2022-10-27 NOTE — Telephone Encounter (Signed)
Done

## 2022-10-29 NOTE — Telephone Encounter (Signed)
Pt has been informed of appointment details. 

## 2022-11-03 ENCOUNTER — Other Ambulatory Visit: Payer: Medicaid Other | Admitting: *Deleted

## 2022-11-03 ENCOUNTER — Encounter: Payer: Self-pay | Admitting: *Deleted

## 2022-11-03 DIAGNOSIS — Z419 Encounter for procedure for purposes other than remedying health state, unspecified: Secondary | ICD-10-CM | POA: Diagnosis not present

## 2022-11-03 NOTE — Patient Outreach (Signed)
Medicaid Managed Care   Nurse Care Manager Note  11/03/2022 Name:  Todd Mccoy MRN:  [id number] DOB:  [birth year]  Todd Mccoy is an 52 y.o. year old male who is a primary patient of Todd Register, MD.  The [facility] Managed Care Coordination team was consulted for assistance with:    CHF HTN  Todd Mccoy was given information about Medicaid Managed Care Coordination team services today. Todd Mccoy Patient agreed to services and verbal consent obtained.  Engaged with patient by telephone for follow up visit in response to provider referral for case management and/or care coordination services.   Assessments/Interventions:  Review of past medical history, allergies, medications, health status, including review of consultants reports, laboratory and other test data, was performed as part of comprehensive evaluation and provision of chronic care management services.  SDOH (Social Determinants of Health) assessments and interventions performed: SDOH Interventions    Flowsheet Row Patient Outreach Telephone from 11/03/2022 in [place] POPULATION HEALTH DEPARTMENT Patient Outreach Telephone from 10/15/2022 in [place] POPULATION HEALTH DEPARTMENT Patient Outreach Telephone from 07/14/2022 in [place] POPULATION HEALTH DEPARTMENT Patient Outreach Telephone from 06/14/2022 in  POPULATION HEALTH DEPARTMENT Patient Outreach Telephone from 03/22/2022 in [facility] Care Coordination Patient Outreach Telephone from 09/30/2021 in [facility] Care Coordination  SDOH Interventions        Food Insecurity Interventions Intervention Not Indicated -- Intervention Not Indicated -- -- Intervention Not Indicated  Housing Interventions Intervention Not Indicated -- Intervention Not Indicated -- Intervention Not Indicated Intervention Not Indicated  Transportation Interventions -- Intervention Not Indicated Intervention Not Indicated  Intervention Not Indicated Intervention Not Indicated Intervention Not Indicated  Utilities Interventions -- -- Intervention Not Indicated -- -- --  Financial Strain Interventions -- -- -- -- -- Intervention Not Indicated  Physical Activity Interventions -- -- -- -- -- Intervention Not Indicated  Stress Interventions -- -- -- -- -- Intervention Not Indicated  Social Connections Interventions -- -- -- -- -- Intervention Not Indicated       Care Plan  No Known Allergies  Medications Reviewed Today     Reviewed by Todd Dach, RN (Registered Nurse) on 11/03/22 at 1336  Med List Status: <None>   Medication Order Taking? Sig Documenting Provider Last Dose Status Informant  acyclovir ointment (ZOVIRAX) 5 % [id number] Yes Apply 1 Application topically every 3 (three) hours. Todd Baton, MD Taking Active   amLODipine (NORVASC) 10 MG tablet [id number] Yes TAKE ONE TABLET BY MOUTH ONCE DAILY Mccoy, Todd Bryant, MD Taking Active   atorvastatin (LIPITOR) 20 MG tablet [id number] Yes Take 1 tablet (20 mg total) by mouth daily. Todd Reichert, MD Taking Active   Blood Pressure Monitoring (OMRON 3 SERIES BP MONITOR) DEVI [id number]  1 each by Does not apply route daily. Todd Reichert, MD  Active   carvedilol (COREG) 25 MG tablet [id number] Yes Take 1.5 tablets (37.5 mg total) by mouth 2 (two) times daily. Todd Islam., NP Taking Active   cetirizine [facility] ALLERGY) 10 MG tablet [id number] Yes Take 1 tablet (10 mg total) by mouth daily. Todd Baton, MD Taking Active   ferrous sulfate 325 (65 FE) MG EC tablet [id number] No Take 325 mg by mouth daily with breakfast.  Patient not taking: Reported on 11/03/2022   [provider] Not Taking Active   hydrALAZINE (APRESOLINE) 100 MG tablet [id number] Yes Take 1 tablet (100 mg total) by mouth 2 (two) times  daily. Todd Islam., NP Taking Active   hydrOXYzine (ATARAX) 25 MG tablet [id number] Yes Take 1 tablet (25 mg total) by mouth at  bedtime as needed. Todd Register, MD Taking Active   ibuprofen (ADVIL) 600 MG tablet [id number] Yes Take 1 tablet (600 mg total) by mouth every 6 (six) hours as needed. Todd Helper, PA-C Taking Active     Discontinued 05/13/20 1120   insulin glargine (LANTUS) 100 UNIT/ML Solostar Pen [id number] Yes Inject 10 Units into the skin at bedtime. Todd Register, MD Taking Active   isosorbide mononitrate (IMDUR) 60 MG 24 hr tablet [id number] Yes TAKE ONE TABLET BY MOUTH ONCE DAILY Todd Reichert, MD Taking Active     Discontinued 12/22/20 1205 (Stop Taking at Discharge)   Naphazoline HCl (CLEAR EYES OP) [id number] Yes Place 1 drop into both eyes as needed (irritation). [provider] Taking Active Self  pregabalin (LYRICA) 50 MG capsule [id number] Yes Take 1 capsule by mouth twice daily Todd Register, MD Taking Active   Torsemide 40 MG TABS [id number] Yes Take 80 mg (2 tablets) by mouth once a day Todd Islam., NP Taking Active             Patient Active Problem List   Diagnosis Date Noted   PAD (peripheral artery disease) (HCC) 10/27/2022   Renal insufficiency    Acute on chronic systolic CHF (congestive heart failure) (HCC) 01/23/2022   Prolonged QT interval 01/23/2022   Chest pain 01/23/2022   Elevated troponin 01/23/2022   Anemia due to chronic kidney disease 01/23/2022   Hypertensive emergency 01/23/2022   Hyponatremia 01/23/2022   Chronic combined systolic (congestive) and diastolic (congestive) heart failure (HCC) 04/07/2021   Chronic renal disease, stage 4, severely decreased glomerular filtration rate between 15-29 mL/min/1.73 square meter (HCC)    Hypertensive urgency    Acute kidney injury superimposed on chronic kidney disease (HCC)    Acute CHF (congestive heart failure) (HCC) 02/27/2021   Normocytic anemia 02/27/2021   Anasarca 02/27/2021   Abscess of left foot 12/10/2020   S/P BKA (below knee amputation) unilateral, left (HCC) 12/10/2020   Dyslipidemia  12/10/2020   CKD (chronic kidney disease), stage III (HCC) 12/10/2020   HTN (hypertension) 11/12/2020   Cellulitis and abscess of foot    Diabetes mellitus with hyperglycemia (HCC) 09/07/2014   Tobacco abuse 09/07/2014   Abscess of back    Abscess of lower back 09/06/2014   DM2 (diabetes mellitus, type 2) (HCC) 09/06/2014   Sepsis (HCC) 09/06/2014   Scrotal abscess 06/20/2014    Conditions to be addressed/monitored per PCP order:  CHF and HTN  Care Plan : RN Care Manager Plan of Care  Updates made by Todd Dach, RN since 11/03/2022 12:00 AM     Problem: Chronic Disease Management and Care Coordination Needs for CHF, HTN, DM   Priority: High     Long-Range Goal: Development of Plan of Care for Chronic Disease Management and Care Coordination Needs (CHF, HTN, DM)   Start Date: 09/30/2021  Expected End Date: 01/03/2023  Priority: High  Note:   Current Barriers:  Knowledge Deficits related to plan of care for management of CHF, HTN, and DMII  Chronic Disease Management support and education needs related to CHF, HTN, and DMII Difficulty obtaining medications Recent Vision 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  RNCM Clinical Goal(s):  Patient will verbalize understanding of plan for management of CHF, HTN, and DMII  as evidenced by improvement in management of these chronic diseases. verbalize basic understanding of CHF, HTN, and DMII disease process and self health management plan as evidenced by improved control of factors that cause CHF, improved blood pressure readings < 140/90 and improved control of blood sugar readings with range with 80 - 120. take all medications exactly as prescribed and will call provider for medication related questions as evidenced by compliance with medications    attend all scheduled medical appointments:  11/12/22 with TFC, 11/19/22 for Lung CT and 11/26/22 with VVS and 12/29/22 with Cardiology as evidenced by  attending all scheduled appointments        demonstrate improved adherence to prescribed treatment plan for CHF, HTN, and DMII as evidenced by better control of blood pressure, blood sugars and factors impacting CHF. continue to work with Medical illustrator and/or Social Worker to address care management and care coordination needs related to CHF, HTN, and DMII as evidenced by adherence to CM Team Scheduled appointments     through collaboration with Medical illustrator, provider, and care team.   Interventions: Inter-disciplinary care team collaboration (see longitudinal plan of care) Evaluation of current treatment plan related to  self management and patient's adherence to plan as established by provider Advised patient to schedule Dermatology appointment for rash  Heart Failure Interventions:  (Status: Goal on Track (progressing): YES.)  Long Term Goal  Wt Readings from Last 3 Encounters:  10/09/22 209 lb (94.8 kg)  09/21/22 209 lb (94.8 kg)  02/09/22 190 lb (86.2 kg)    Reviewed Heart Failure Action Plan in depth and provided written copy Discussed importance of daily weight and advised patient to weigh and record daily Discussed the importance of keeping all appointments with provider Assessed social determinant of health barriers Reviewed medications Advised patient to take all medications and BP readings to all upcoming appointments Reviewed upcoming appointments including appointments listed above and verified patient has transportation   Hypertension: (Status: Goal on Track (progressing): YES.) Long Term Goal Patient will have someone pick up Amlodipine today Last practice recorded BP readings:  BP Readings from Last 3 Encounters:  10/27/22 128/80  10/09/22 (!) 162/102  09/21/22 (!) 142/90  Most recent eGFR/CrCl:  Lab Results  Component Value Date   EGFR 12 (L) 10/05/2022    No components found for: "CRCL"  Reviewed medications with patient and discussed importance of  compliance;  Advised patient, providing education and rationale, to monitor blood pressure daily and record, calling PCP for findings outside established parameters;  Reviewed scheduled/upcoming provider appointments including: 10/27/22 with PCP and Cardiology on 12/29/22 Discussed the importance of checking BP at home, patient reports good BP at home, unable to provide reading Ensured patient received Amlodipine and now taking as directed Advised patient to take all medications to PCP visit on 10/27/22   Patient Goals/Self-Care Activities: Take medications as prescribed   Attend all scheduled provider appointments Call pharmacy for medication refills 3-7 days in advance of running out of medications Call provider office for new concerns or questions  check blood sugar at prescribed times: once daily check blood pressure daily limit salt intake to 2000 mg/day       Follow Up:  Patient agrees to Care Plan and Follow-up.  Plan: The Managed Medicaid care management team will reach out to the patient again over the next 60 days.  Date/time of next scheduled RN care management/care coordination outreach:  01/03/23 @ 1:15 pm  Estanislado Emms RN, [name] [place]  Managed Medicaid RN Care Coordinator [phone]

## 2022-11-03 NOTE — Patient Instructions (Signed)
Visit Information  Todd Mccoy was given information about Medicaid Managed Care team care coordination services as a part of their [facility] Medicaid benefit. Todd Mccoy verbally consented to engagement with the [facility] Managed Care team.   If you are experiencing a medical emergency, please call 911 or report to your local emergency department or urgent care.   If you have a non-emergency medical problem during routine business hours, please contact your provider's office and ask to speak with a nurse.   For questions related to your [facility] health plan, please call: [phone] or go here:[email or web address]  If you would like to schedule transportation through your [facility] plan, please call the following number at least 2 days in advance of your appointment: [phone].   You can also use the MTM portal or MTM mobile app to manage your rides. Reimbursement for transportation is available through [facility]! For the portal, please go to [email or web address]  Call the [facility] Crisis Line at [phone], at any time, 24 hours a day, 7 days a week. If you are in danger or need immediate medical attention call 911.  If you would like help to quit smoking, call 1-800-QUIT-NOW (575-786-5407) OR Espaol: 1-855-Djelo-Ya (4-132-440-1027) o para ms informacin haga clic aqu or Text READY to 253-664 to register via text  Todd Mccoy,   Please see education materials related to HF provided as print materials.   The patient verbalized understanding of instructions, educational materials, and care plan provided today and agreed to receive a mailed copy of patient instructions, educational materials, and care plan.   Telephone follow up appointment with Managed Medicaid care management team member scheduled for:01/03/23 @ 1:15pm  Estanislado Emms RN, [name] [place]  Managed [facility] RN Care Coordinator [phone]   Following is a copy of your  plan of care:  Care Plan : RN Care Manager Plan of Care  Updates made by Heidi Dach, RN since 11/03/2022 12:00 AM     Problem: Chronic Disease Management and Care Coordination Needs for CHF, HTN, DM   Priority: High     Long-Range Goal: Development of Plan of Care for Chronic Disease Management and Care Coordination Needs (CHF, HTN, DM)   Start Date: 09/30/2021  Expected End Date: 01/03/2023  Priority: High  Note:   Current Barriers:  Knowledge Deficits related to plan of care for management of CHF, HTN, and DMII  Chronic Disease Management support and education needs related to CHF, HTN, and DMII Difficulty obtaining medications Recent Vision 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  RNCM Clinical Goal(s):  Patient will verbalize understanding of plan for management of CHF, HTN, and DMII as evidenced by improvement in management of these chronic diseases. verbalize basic understanding of CHF, HTN, and DMII disease process and self health management plan as evidenced by improved control of factors that cause CHF, improved blood pressure readings < 140/90 and improved control of blood sugar readings with range with 80 - 120. take all medications exactly as prescribed and will call provider for medication related questions as evidenced by compliance with medications    attend all scheduled medical appointments:  11/12/22 with TFC, 11/19/22 for Lung CT and 11/26/22 with [facility] and 12/29/22 with Cardiology as evidenced by attending all scheduled appointments        demonstrate improved adherence to prescribed treatment plan for CHF, HTN, and DMII as evidenced by better control of blood pressure, blood sugars and factors impacting CHF.  continue to work with Medical illustrator and/or Social Worker to address care management and care coordination needs related to CHF, HTN, and DMII as evidenced by adherence to CM Team Scheduled appointments     through collaboration  with Medical illustrator, provider, and care team.   Interventions: Inter-disciplinary care team collaboration (see longitudinal plan of care) Evaluation of current treatment plan related to  self management and patient's adherence to plan as established by provider Advised patient to schedule Dermatology appointment for rash  Heart Failure Interventions:  (Status: Goal on Track (progressing): YES.)  Long Term Goal  Wt Readings from Last 3 Encounters:  10/09/22 209 lb (94.8 kg)  09/21/22 209 lb (94.8 kg)  02/09/22 190 lb (86.2 kg)    Reviewed Heart Failure Action Plan in depth and provided written copy Discussed importance of daily weight and advised patient to weigh and record daily Discussed the importance of keeping all appointments with provider Assessed social determinant of health barriers Reviewed medications Advised patient to take all medications and BP readings to all upcoming appointments Reviewed upcoming appointments including appointments listed above and verified patient has transportation   Hypertension: (Status: Goal on Track (progressing): YES.) Long Term Goal Patient will have someone pick up Amlodipine today Last practice recorded BP readings:  BP Readings from Last 3 Encounters:  10/27/22 128/80  10/09/22 (!) 162/102  09/21/22 (!) 142/90  Most recent eGFR/CrCl:  Lab Results  Component Value Date   EGFR 12 (L) 10/05/2022    No components found for: "CRCL"  Reviewed medications with patient and discussed importance of compliance;  Advised patient, providing education and rationale, to monitor blood pressure daily and record, calling PCP for findings outside established parameters;  Reviewed scheduled/upcoming provider appointments including: 10/27/22 with PCP and Cardiology on 12/29/22 Discussed the importance of checking BP at home, patient reports good BP at home, unable to provide reading Ensured patient received Amlodipine and now taking as directed Advised  patient to take all medications to PCP visit on 10/27/22   Patient Goals/Self-Care Activities: Take medications as prescribed   Attend all scheduled provider appointments Call pharmacy for medication refills 3-7 days in advance of running out of medications Call provider office for new concerns or questions  check blood sugar at prescribed times: once daily check blood pressure daily limit salt intake to 2000 mg/day

## 2022-11-12 ENCOUNTER — Ambulatory Visit: Payer: Medicaid Other | Admitting: Podiatry

## 2022-11-17 ENCOUNTER — Telehealth: Payer: Self-pay | Admitting: Family Medicine

## 2022-11-17 ENCOUNTER — Other Ambulatory Visit: Payer: Self-pay | Admitting: *Deleted

## 2022-11-17 DIAGNOSIS — I739 Peripheral vascular disease, unspecified: Secondary | ICD-10-CM

## 2022-11-17 NOTE — Telephone Encounter (Signed)
Pt is scheduled for a CT scan, but the insurance is requiring a Prior Authorization from the PCP. Please advise.

## 2022-11-18 ENCOUNTER — Other Ambulatory Visit: Payer: Medicaid Other | Admitting: *Deleted

## 2022-11-18 ENCOUNTER — Telehealth: Payer: Self-pay | Admitting: Family Medicine

## 2022-11-18 NOTE — Patient Instructions (Signed)
Visit Information  Mr. Libertini was given information about Medicaid Managed Care team care coordination services as a part of their [facility] Medicaid benefit. CHRISOPHER HOUSHOLDER verbally consented to engagement with the [facility] Managed Care team.   If you are experiencing a medical emergency, please call 911 or report to your local emergency department or urgent care.   If you have a non-emergency medical problem during routine business hours, please contact your provider's office and ask to speak with a nurse.   For questions related to your [facility] health plan, please call: [phone] or go here:[email or web address]  If you would like to schedule transportation through your [facility] plan, please call the following number at least 2 days in advance of your appointment: [phone].   You can also use the MTM portal or MTM mobile app to manage your rides. Reimbursement for transportation is available through [facility]! For the portal, please go to [email or web address]  Call the [facility] Crisis Line at [phone], at any time, 24 hours a day, 7 days a week. If you are in danger or need immediate medical attention call 911.  If you would like help to quit smoking, call 1-800-QUIT-NOW (571-265-0088) OR Espaol: 1-855-Djelo-Ya (4-132-440-1027) o para ms informacin haga clic aqu or Text READY to 253-664 to register via text  Mr. Michal,   Please see education materials related to HF provided as print materials.   The patient verbalized understanding of instructions, educational materials, and care plan provided today and agreed to receive a mailed copy of patient instructions, educational materials, and care plan.   Telephone follow up appointment with Managed Medicaid care management team member scheduled for:01/03/23 @ 1:15pm  Estanislado Emms RN, [name] [place]  Managed [facility] RN Care Coordinator [phone]   Following is a copy of your  plan of care:  Care Plan : RN Care Manager Plan of Care  Updates made by Heidi Dach, RN since 11/18/2022 12:00 AM     Problem: Chronic Disease Management and Care Coordination Needs for CHF, HTN, DM   Priority: High     Long-Range Goal: Development of Plan of Care for Chronic Disease Management and Care Coordination Needs (CHF, HTN, DM)   Start Date: 09/30/2021  Expected End Date: 01/03/2023  Priority: High  Note:   Current Barriers:  Knowledge Deficits related to plan of care for management of CHF, HTN, and DMII  Chronic Disease Management support and education needs related to CHF, HTN, and DMII Difficulty obtaining medications Recent Vision 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  RNCM Clinical Goal(s):  Patient will verbalize understanding of plan for management of CHF, HTN, and DMII as evidenced by improvement in management of these chronic diseases. verbalize basic understanding of CHF, HTN, and DMII disease process and self health management plan as evidenced by improved control of factors that cause CHF, improved blood pressure readings < 140/90 and improved control of blood sugar readings with range with 80 - 120. take all medications exactly as prescribed and will call provider for medication related questions as evidenced by compliance with medications    attend all scheduled medical appointments:  11/19/22 for Lung CT and 11/26/22 with [facility] and 12/29/22 with Cardiology as evidenced by attending all scheduled appointments        demonstrate improved adherence to prescribed treatment plan for CHF, HTN, and DMII as evidenced by better control of blood pressure, blood sugars and factors impacting CHF. continue to work  with RN Care Manager and/or Social Worker to address care management and care coordination needs related to CHF, HTN, and DMII as evidenced by adherence to CM Team Scheduled appointments     through collaboration with RN Care  manager, provider, and care team.   Interventions: Inter-disciplinary care team collaboration (see longitudinal plan of care) Evaluation of current treatment plan related to  self management and patient's adherence to plan as established by provider Collaborated with PCP regarding requested referral [facility] Assisted with MyChart sign up  Heart Failure Interventions:  (Status: Goal on Track (progressing): YES.)  Long Term Goal  Wt Readings from Last 3 Encounters:  10/09/22 209 lb (94.8 kg)  09/21/22 209 lb (94.8 kg)  02/09/22 190 lb (86.2 kg)    Reviewed Heart Failure Action Plan in depth and provided written copy Discussed importance of daily weight and advised patient to weigh and record daily Discussed the importance of keeping all appointments with provider Assessed social determinant of health barriers Reviewed medications Advised patient to take all medications and BP readings to all upcoming appointments Reviewed upcoming appointments including appointments listed above and verified patient has transportation-provided details to patient and family Advised to seek evaluation if concerned about swelling and water retention   Hypertension: (Status: Condition stable. Not addressed this visit.) Long Term Goal Patient will have someone pick up Amlodipine today Last practice recorded BP readings:  BP Readings from Last 3 Encounters:  10/27/22 128/80  10/09/22 (!) 162/102  09/21/22 (!) 142/90  Most recent eGFR/CrCl:  Lab Results  Component Value Date   EGFR 12 (L) 10/05/2022    No components found for: "CRCL"  Reviewed medications with patient and discussed importance of compliance;  Advised patient, providing education and rationale, to monitor blood pressure daily and record, calling PCP for findings outside established parameters;  Reviewed scheduled/upcoming provider appointments including: 10/27/22 with PCP and Cardiology on 12/29/22 Discussed the  importance of checking BP at home, patient reports good BP at home, unable to provide reading Ensured patient received Amlodipine and now taking as directed Advised patient to take all medications to PCP visit on 10/27/22   Patient Goals/Self-Care Activities: Take medications as prescribed   Attend all scheduled provider appointments Call pharmacy for medication refills 3-7 days in advance of running out of medications Call provider office for new concerns or questions  check blood sugar at prescribed times: once daily check blood pressure daily limit salt intake to 2000 mg/day

## 2022-11-18 NOTE — Patient Outreach (Signed)
Medicaid Managed Care   Nurse Care Manager Note  11/18/2022 Name:  Todd Mccoy MRN:  [id number] DOB:  07-09-1970  Todd Mccoy is an 52 y.o. year old male who is a primary patient of [name], MD.  The [facility] Managed Care Coordination team was consulted for assistance with:    CHF HTN  [name] was given information about Medicaid Managed Care Coordination team services today. Todd Mccoy Patient agreed to services and verbal consent obtained.  RNCM received an incoming call from Patient and family member Todd Mccoy. Permission received from Mr. Sudbeck to discuss health information with Todd Mccoy.  Engaged with patient by telephone for follow up visit in response to provider referral for case management and/or care coordination services.   Assessments/Interventions:  Review of past medical history, allergies, medications, health status, including review of consultants reports, laboratory and other test data, was performed as part of comprehensive evaluation and provision of chronic care management services.  SDOH (Social Determinants of Health) assessments and interventions performed: SDOH Interventions    Flowsheet Row Patient Outreach Telephone from 11/03/2022 in [place] POPULATION HEALTH DEPARTMENT Patient Outreach Telephone from 10/15/2022 in [place] POPULATION HEALTH DEPARTMENT Patient Outreach Telephone from 07/14/2022 in [place] POPULATION HEALTH DEPARTMENT Patient Outreach Telephone from 06/14/2022 in [place] POPULATION HEALTH DEPARTMENT Patient Outreach Telephone from 03/22/2022 in [facility] Care Coordination Patient Outreach Telephone from 09/30/2021 in [facility] Care Coordination  SDOH Interventions        Food Insecurity Interventions Intervention Not Indicated -- Intervention Not Indicated -- -- Intervention Not Indicated  Housing Interventions Intervention Not Indicated --  Intervention Not Indicated -- Intervention Not Indicated Intervention Not Indicated  Transportation Interventions -- Intervention Not Indicated Intervention Not Indicated Intervention Not Indicated Intervention Not Indicated Intervention Not Indicated  Utilities Interventions -- -- Intervention Not Indicated -- -- --  Financial Strain Interventions -- -- -- -- -- Intervention Not Indicated  Physical Activity Interventions -- -- -- -- -- Intervention Not Indicated  Stress Interventions -- -- -- -- -- Intervention Not Indicated  Social Connections Interventions -- -- -- -- -- Intervention Not Indicated       Care Plan  No Known Allergies  Medications Reviewed Today     Reviewed by Heidi Dach, RN (Registered Nurse) on 11/03/22 at 1336  Med List Status: <None>   Medication Order Taking? Sig Documenting Provider Last Dose Status Informant  acyclovir ointment (ZOVIRAX) 5 % [id number] Yes Apply 1 Application topically every 3 (three) hours. Rondel Baton, MD Taking Active   amLODipine (NORVASC) 10 MG tablet [id number] Yes TAKE ONE TABLET BY MOUTH ONCE DAILY Turner, Cornelious Bryant, MD Taking Active   atorvastatin (LIPITOR) 20 MG tablet [id number] Yes Take 1 tablet (20 mg total) by mouth daily. Quintella Reichert, MD Taking Active   Blood Pressure Monitoring (OMRON 3 SERIES BP MONITOR) DEVI [id number]  1 each by Does not apply route daily. Quintella Reichert, MD  Active   carvedilol (COREG) 25 MG tablet [id number] Yes Take 1.5 tablets (37.5 mg total) by mouth 2 (two) times daily. Gaston Islam., NP Taking Active   cetirizine [facility] ALLERGY) 10 MG tablet [id number] Yes Take 1 tablet (10 mg total) by mouth daily. Rondel Baton, MD Taking Active   ferrous sulfate 325 (65 FE) MG EC tablet [id number] No Take 325 mg by mouth daily with breakfast.  Patient not taking: Reported on 11/03/2022   [provider] Not Taking Active   hydrALAZINE (APRESOLINE) 100 MG tablet [id number] Yes Take 1 tablet  (100 mg total) by mouth 2 (two) times daily. Gaston Islam., NP Taking Active   hydrOXYzine (ATARAX) 25 MG tablet [id number] Yes Take 1 tablet (25 mg total) by mouth at bedtime as needed. [name], MD Taking Active   ibuprofen (ADVIL) 600 MG tablet [id number] Yes Take 1 tablet (600 mg total) by mouth every 6 (six) hours as needed. Fayrene Helper, PA-C Taking Active     Discontinued 05/13/20 1120   insulin glargine (LANTUS) 100 UNIT/ML Solostar Pen [id number] Yes Inject 10 Units into the skin at bedtime. [name], MD Taking Active   isosorbide mononitrate (IMDUR) 60 MG 24 hr tablet [id number] Yes TAKE ONE TABLET BY MOUTH ONCE DAILY Quintella Reichert, MD Taking Active     Discontinued 12/22/20 1205 (Stop Taking at Discharge)   Naphazoline HCl (CLEAR EYES OP) [id number] Yes Place 1 drop into both eyes as needed (irritation). [provider] Taking Active Self  pregabalin (LYRICA) 50 MG capsule [id number] Yes Take 1 capsule by mouth twice daily [name], MD Taking Active   Torsemide 40 MG TABS [id number] Yes Take 80 mg (2 tablets) by mouth once a day Gaston Islam., NP Taking Active             Patient Active Problem List   Diagnosis Date Noted   PAD (peripheral artery disease) (HCC) 10/27/2022   Renal insufficiency    Acute on chronic systolic CHF (congestive heart failure) (HCC) 01/23/2022   Prolonged QT interval 01/23/2022   Chest pain 01/23/2022   Elevated troponin 01/23/2022   Anemia due to chronic kidney disease 01/23/2022   Hypertensive emergency 01/23/2022   Hyponatremia 01/23/2022   Chronic combined systolic (congestive) and diastolic (congestive) heart failure (HCC) 04/07/2021   Chronic renal disease, stage 4, severely decreased glomerular filtration rate between 15-29 mL/min/1.73 square meter (HCC)    Hypertensive urgency    Acute kidney injury superimposed on chronic kidney disease (HCC)    Acute CHF (congestive heart failure) (HCC) 02/27/2021    Normocytic anemia 02/27/2021   Anasarca 02/27/2021   Abscess of left foot 12/10/2020   S/P BKA (below knee amputation) unilateral, left (HCC) 12/10/2020   Dyslipidemia 12/10/2020   CKD (chronic kidney disease), stage III (HCC) 12/10/2020   HTN (hypertension) 11/12/2020   Cellulitis and abscess of foot    Diabetes mellitus with hyperglycemia (HCC) 09/07/2014   Tobacco abuse 09/07/2014   Abscess of back    Abscess of lower back 09/06/2014   DM2 (diabetes mellitus, type 2) (HCC) 09/06/2014   Sepsis (HCC) 09/06/2014   Scrotal abscess 06/20/2014    Conditions to be addressed/monitored per PCP order:  CHF and HTN  Care Plan : RN Care Manager Plan of Care  Updates made by Heidi Dach, RN since 11/18/2022 12:00 AM     Problem: Chronic Disease Management and Care Coordination Needs for CHF, HTN, DM   Priority: High     Long-Range Goal: Development of Plan of Care for Chronic Disease Management and Care Coordination Needs (CHF, HTN, DM)   Start Date: 09/30/2021  Expected End Date: 01/03/2023  Priority: High  Note:   Current Barriers:  Knowledge Deficits related to plan of care for management of CHF, HTN, and DMII  Chronic Disease Management support and education needs related to CHF, HTN, and DMII Difficulty obtaining medications Recent Vision 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  RNCM Clinical Goal(s):  Patient will verbalize understanding of plan for management of CHF, HTN, and DMII as evidenced by improvement in management of these chronic diseases. verbalize basic understanding of CHF, HTN, and DMII disease process and self health management plan as evidenced by improved control of factors that cause CHF, improved blood pressure readings < 140/90 and improved control of blood sugar readings with range with 80 - 120. take all medications exactly as prescribed and will call provider for medication related questions as evidenced by  compliance with medications    attend all scheduled medical appointments:  11/19/22 for Lung CT and 11/26/22 with VVS and 12/29/22 with Cardiology as evidenced by attending all scheduled appointments        demonstrate improved adherence to prescribed treatment plan for CHF, HTN, and DMII as evidenced by better control of blood pressure, blood sugars and factors impacting CHF. continue to work with Medical illustrator and/or Social Worker to address care management and care coordination needs related to CHF, HTN, and DMII as evidenced by adherence to CM Team Scheduled appointments     through collaboration with Medical illustrator, provider, and care team.   Interventions: Inter-disciplinary care team collaboration (see longitudinal plan of care) Evaluation of current treatment plan related to  self management and patient's adherence to plan as established by provider Collaborated with PCP regarding requested referral to [facility] Assisted with MyChart sign up  Heart Failure Interventions:  (Status: Goal on Track (progressing): YES.)  Long Term Goal  Wt Readings from Last 3 Encounters:  10/09/22 209 lb (94.8 kg)  09/21/22 209 lb (94.8 kg)  02/09/22 190 lb (86.2 kg)    Reviewed Heart Failure Action Plan in depth and provided written copy Discussed importance of daily weight and advised patient to weigh and record daily Discussed the importance of keeping all appointments with provider Assessed social determinant of health barriers Reviewed medications Advised patient to take all medications and BP readings to all upcoming appointments Reviewed upcoming appointments including appointments listed above and verified patient has transportation-provided details to patient and family Advised to seek evaluation if concerned about swelling and water retention   Hypertension: (Status: Condition stable. Not addressed this visit.) Long Term Goal Patient will have someone pick up Amlodipine  today Last practice recorded BP readings:  BP Readings from Last 3 Encounters:  10/27/22 128/80  10/09/22 (!) 162/102  09/21/22 (!) 142/90  Most recent eGFR/CrCl:  Lab Results  Component Value Date   EGFR 12 (L) 10/05/2022    No components found for: "CRCL"  Reviewed medications with patient and discussed importance of compliance;  Advised patient, providing education and rationale, to monitor blood pressure daily and record, calling PCP for findings outside established parameters;  Reviewed scheduled/upcoming provider appointments including: 10/27/22 with PCP and Cardiology on 12/29/22 Discussed the importance of checking BP at home, patient reports good BP at home, unable to provide reading Ensured patient received Amlodipine and now taking as directed Advised patient to take all medications to PCP visit on 10/27/22   Patient Goals/Self-Care Activities: Take medications as prescribed   Attend all scheduled provider appointments Call pharmacy for medication refills 3-7 days in advance of running out of medications Call provider office for new concerns or questions  check blood sugar at prescribed times: once daily check blood pressure daily limit salt intake to 2000 mg/day       Follow Up:  Patient  agrees to Care Plan and Follow-up.  Plan: The Managed Medicaid care management team will reach out to the patient again over the next 60 days.  Date/time of next scheduled RN care management/care coordination outreach:  01/03/23 at 1:15 pm  Estanislado Emms RN, [name]   Managed [facility] RN Care Coordinator [phone]

## 2022-11-18 NOTE — Telephone Encounter (Signed)
error 

## 2022-11-18 NOTE — Telephone Encounter (Signed)
Late entry for 11/17/2022 . [employer] PA line called awaiting authorization. Reason given is the rendering facility is out of network. Per  representative  authorization may take up to 72 hours.     11/18/2022 CT chest approval # [id number]

## 2022-11-19 ENCOUNTER — Ambulatory Visit (HOSPITAL_COMMUNITY)
Admission: RE | Admit: 2022-11-19 | Discharge: 2022-11-19 | Disposition: A | Discharge status: Home / Self Care | Payer: Medicaid Other | Source: Ambulatory Visit | Attending: Family Medicine | Admitting: Family Medicine

## 2022-11-19 ENCOUNTER — Other Ambulatory Visit: Payer: Self-pay | Admitting: Family Medicine

## 2022-11-19 DIAGNOSIS — F17210 Nicotine dependence, cigarettes, uncomplicated: Secondary | ICD-10-CM | POA: Insufficient documentation

## 2022-11-19 DIAGNOSIS — Z87891 Personal history of nicotine dependence: Secondary | ICD-10-CM | POA: Diagnosis not present

## 2022-11-19 DIAGNOSIS — E11628 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 complications: Secondary | ICD-10-CM

## 2022-11-24 ENCOUNTER — Ambulatory Visit: Payer: Self-pay | Admitting: *Deleted

## 2022-11-24 NOTE — Telephone Encounter (Signed)
Results already addressed in result note.

## 2022-11-24 NOTE — Telephone Encounter (Signed)
Called received from [place] at [facility] to report results of CT chest lung cancer screening from 11/19/22.  IMPRESSION: 1. Lung-RADS 0s, incomplete. Additional lung cancer screening CT images/or comparison to prior chest CT examinations is needed. 2. The S modifier above refers to the moderate right pleural effusion with partial atelectasis of the right lower lobe. This results in incomplete visualization of the lungs. Recommend repeat LDCT in 1-3 months following resolution of pleural effusion. 3. Enlarged left axillary lymph node measuring 1.5 cm. This is nonspecific and may be reactive in etiology. Attention on follow-up imaging advised. 4. Small pericardial effusion. 5. Aortic Atherosclerosis (ICD10-I70.0).   These results will be called to the ordering clinician or representative by the Radiologist Assistant, and communication documented in the PACS or [employer].   please advise.

## 2022-11-25 NOTE — Progress Notes (Signed)
Office Note     CC:  Right foot PAD. History of left AKA. History of diabetes, CHF  Requesting Provider:  [name], MD  HPI: Todd Mccoy is a 52 y.o. (1970-08-02) male presenting at the request of .[name], MD for evaluation of right foot wounds.  On exam, Todd Mccoy was doing well, accompanied by his son.  A native of [place], he moved to [place] years ago.  Todd Mccoy is mainly wheelchair-bound after undergoing left above-knee amputation.  Since that time, he has appreciated significant swelling in the right lower extremity.  This is led to wounds both on the calf and on the lateral aspect of the heel.  His niece has been helping him with wound care.  The pt is  on a statin for cholesterol management.  The pt is not on a daily aspirin.   Other AC:  - The pt is on medication for hypertension.   The pt is  diabetic.  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/2020   pt states recent MD visit /left eye worse than right   Dyslipidemia 12/10/2020   Hx of BKA, left (HCC)    Hypertension    Morbid obesity (HCC)    Osteomyelitis (HCC)    Pneumonia     Past Surgical History:  Procedure Laterality Date   ABSCESS DRAINAGE     neck   AMPUTATION Left 11/14/2020   Procedure: LEFT 5TH RAY AMPUTATION;  Surgeon: Nadara Mustard, MD;  Location: [facility] OR;  Service: Orthopedics;  Laterality: Left;   AMPUTATION Left 12/10/2020   Procedure: LEFT BELOW KNEE AMPUTATION;  Surgeon:  Nadara Mustard, MD;  Location: [facility] OR;  Service: Orthopedics;  Laterality: Left;   AMPUTATION TOE Right 03/25/2020   Procedure: AMPUTATION TOE;  Surgeon: Park Liter, DPM;  Location: [facility] ORS;  Service: Podiatry;  Laterality: Right;   INCISION AND DRAINAGE ABSCESS Right 09/07/2014   Procedure: INCISION AND DRAINAGE ABSCESS RIGHT FLANK;  Surgeon: Avel Peace, MD;  Location: [facility] ORS;  Service: General;  Laterality: Right;   LEG AMPUTATION BELOW KNEE Left 12/10/2020   SCROTUM EXPLORATION      Social History   Socioeconomic History   Marital status: Single    Spouse name: Not on file   Number of children: 1   Years of education: Not on file   Highest education level: Not on file  Occupational History   Not on file  Tobacco Use   Smoking status: Some Days    Packs/day: 1.00    Years: 32.00    Additional pack years: 0.00    Total pack years: 32.00    Types: Cigarettes    Last attempt to quit: 08/24/2021    Years since quitting: 1.2    Passive exposure: Past   Smokeless tobacco: Former   Tobacco comments:    Quit smoking previously February 2023  Vaping Use   Vaping  Use: Never used  Substance and Sexual Activity   Alcohol use: No   Drug use: No   Sexual activity: Not on file  Other Topics Concern   Not on file  Social History Narrative   Not on file   Social Determinants of Health   Financial Resource Strain: Low Risk  (09/30/2021)   Overall Financial Resource Strain (CARDIA)    Difficulty of Paying Living Expenses: Not hard at all  Food Insecurity: No Food Insecurity (11/03/2022)   Hunger Vital Sign    Worried About Running Out of Food in the Last Year: Never true    Ran Out of Food in the Last Year: Never true  Transportation Needs: No Transportation Needs (10/15/2022)   PRAPARE - Administrator, Civil Service (Medical): No    Lack of Transportation (Non-Medical): No  Physical Activity: Inactive (09/30/2021)   Exercise Vital Sign    Days of Exercise per  Week: 0 days    Minutes of Exercise per Session: 0 min  Stress: No Stress Concern Present (09/30/2021)   [employer] of Occupational Health - Occupational Stress Questionnaire    Feeling of Stress : Only a little  Social Connections: Moderately Isolated (09/30/2021)   Social Connection and Isolation Panel [NHANES]    Frequency of Communication with Friends and Family: More than three times a week    Frequency of Social Gatherings with Friends and Family: More than three times a week    Attends Religious Services: 1 to 4 times per year    Active Member of [employer] or Organizations: No    Attends Engineer, structural: Never    Marital Status: Never married  Catering manager Violence: Not on file   Family History  Problem Relation Age of Onset   Diabetes Mother    Diabetes Father    Diabetes Brother    Colon cancer Neg Hx    Esophageal cancer Neg Hx    Pancreatic cancer Neg Hx    Stomach cancer Neg Hx    Liver disease Neg Hx    CAD Neg Hx     Current Outpatient Medications  Medication Sig Dispense Refill   acyclovir ointment (ZOVIRAX) 5 % Apply 1 Application topically every 3 (three) hours. 15 g 0   amLODipine (NORVASC) 10 MG tablet TAKE ONE TABLET BY MOUTH ONCE DAILY 90 tablet 2   atorvastatin (LIPITOR) 20 MG tablet Take 1 tablet (20 mg total) by mouth daily. 90 tablet 3   Blood Pressure Monitoring (OMRON 3 SERIES BP MONITOR) DEVI 1 each by Does not apply route daily. 1 each 0   carvedilol (COREG) 25 MG tablet Take 1.5 tablets (37.5 mg total) by mouth 2 (two) times daily. 90 tablet 3   cetirizine (ZYRTEC ALLERGY) 10 MG tablet Take 1 tablet (10 mg total) by mouth daily. 30 tablet 0   ferrous sulfate 325 (65 FE) MG EC tablet Take 325 mg by mouth daily with breakfast. (Patient not taking: Reported on 11/03/2022)     hydrALAZINE (APRESOLINE) 100 MG tablet Take 1 tablet (100 mg total) by mouth 2 (two) times daily. 60 tablet 3   hydrOXYzine (ATARAX) 25 MG tablet Take 1 tablet  (25 mg total) by mouth at bedtime as needed. 30 tablet 1   ibuprofen (ADVIL) 600 MG tablet Take 1 tablet (600 mg total) by mouth every 6 (six) hours as needed. 30 tablet 0   insulin glargine (LANTUS) 100 UNIT/ML Solostar Pen Inject 10 Units into the  skin at bedtime. 9 mL 3   isosorbide mononitrate (IMDUR) 60 MG 24 hr tablet TAKE ONE TABLET BY MOUTH ONCE DAILY 30 tablet 3   Naphazoline HCl (CLEAR EYES OP) Place 1 drop into both eyes as needed (irritation).     pregabalin (LYRICA) 50 MG capsule Take 1 capsule by mouth twice daily 60 capsule 0   Torsemide 40 MG TABS Take 80 mg (2 tablets) by mouth once a day 60 tablet 3   No current facility-administered medications for this visit.    No Known Allergies   REVIEW OF SYSTEMS:  [X]  denotes positive finding, [ ]  denotes negative finding Cardiac  Comments:  Chest pain or chest pressure:    Shortness of breath upon exertion:    Short of breath when lying flat:    Irregular heart rhythm:        Vascular    Pain in calf, thigh, or hip brought on by ambulation:    Pain in feet at night that wakes you up from your sleep:     Blood clot in your veins:    Leg swelling:         Pulmonary    Oxygen at home:    Productive cough:     Wheezing:         Neurologic    Sudden weakness in arms or legs:     Sudden numbness in arms or legs:     Sudden onset of difficulty speaking or slurred speech:    Temporary loss of vision in one eye:     Problems with dizziness:         Gastrointestinal    Blood in stool:     Vomited blood:         Genitourinary    Burning when urinating:     Blood in urine:        Psychiatric    Major depression:         Hematologic    Bleeding problems:    Problems with blood clotting too easily:        Skin    Rashes or ulcers:        Constitutional    Fever or chills:      PHYSICAL EXAMINATION:  There were no vitals filed for this visit.  General:  WDWN in NAD; vital signs documented above Gait: Not  observed HENT: WNL, normocephalic Pulmonary: normal non-labored breathing , without wheezing Cardiac: regular HR Abdomen: soft, NT, no masses Skin: without rashes Vascular Exam/Pulses:  Right Left  Radial 2+ (normal) 2+ (normal)  Ulnar    Femoral    Popliteal    DP  bka  PT 2+ (normal)    Extremities: without ischemic changes, without Gangrene , without cellulitis; with open wounds;  Buff[place] hump   Musculoskeletal: no muscle wasting or atrophy  Neurologic: A&O X 3;  No focal weakness or paresthesias are detected Psychiatric:  The pt has Normal affect.   Non-Invasive Vascular Imaging:   ABI Findings:  +---------+------------------+-----+---------+--------+  Right   Rt Pressure (mmHg)IndexWaveform Comment   +---------+------------------+-----+---------+--------+  Brachial 197                                       +---------+------------------+-----+---------+--------+  PTA     238               1.21 triphasic          +---------+------------------+-----+---------+--------+  DP      208               1.06 biphasic           +---------+------------------+-----+---------+--------+  Great Toe194               0.98 Normal             +---------+------------------+-----+---------+--------+   +--------+------------------+-----+--------+-------+  Left   Lt Pressure (mmHg)IndexWaveformComment  +--------+------------------+-----+--------+-------+  Todd Mccoy                                    +--------+------------------+-----+--------+-------+  PTA                                   BKA      +--------+------------------+-----+--------+-------+  DP                                    BKA      +--------+------------------+-----+--------+-------+   +-------+-----------+-----------+------------+------------+  ABI/TBIToday's ABIToday's TBIPrevious ABIPrevious TBI  +-------+-----------+-----------+------------+------------+   Right 1.21       0.98       1.07        0             +-------+-----------+-----------+------------+------------+  Left  BKA        BKA        1.07                      +-------+-----------+-----------+------------+------------+      ASSESSMENT/PLAN: CASHMERE CLENNEY is a 52 y.o. male presenting with tibial and lateral malleolar wounds associated with edema and fluid overload in the right leg. ABI was reviewed demonstrating relatively normal waveforms with a falsely elevated toe pressure.  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  Wounds are present on the pretibial area as well as the right heel.  The wound care he has received has been excellent, however [place] needs elevation and compression.  He would benefit from wound center referral for possible [place] boot.  With current ABIs, I do not think he requires revascularization at this time.  Should his wounds worsen, I would offer lower extremity angiogram.  My plan is to see him back in the office in 2 months to assess improvement I asked that he elevate his leg when possible.  Recommend the following which can slow the progression of atherosclerosis and reduce the risk of major adverse cardiac / limb events:  Aspirin 81mg  PO QD.  Atorvastatin 40-80mg  PO QD (or other "high intensity" statin therapy). Complete cessation from all tobacco products. Blood glucose control with goal A1c < 7%. Blood pressure control with goal blood pressure < 140/90 mmHg. Lipid reduction therapy with goal LDL-C <100 mg/dL (<98 if symptomatic from PAD).     Victorino Sparrow, MD [facility] [phone]

## 2022-11-26 ENCOUNTER — Encounter: Payer: Self-pay | Admitting: Vascular Surgery

## 2022-11-26 ENCOUNTER — Ambulatory Visit (INDEPENDENT_AMBULATORY_CARE_PROVIDER_SITE_OTHER): Payer: Medicaid Other | Admitting: Vascular Surgery

## 2022-11-26 ENCOUNTER — Ambulatory Visit (HOSPITAL_COMMUNITY)
Admission: RE | Admit: 2022-11-26 | Discharge: 2022-11-26 | Disposition: A | Discharge status: Home / Self Care | Payer: Medicaid Other | Source: Ambulatory Visit | Attending: Vascular Surgery | Admitting: Vascular Surgery

## 2022-11-26 VITALS — BP 172/105 | HR 69 | Temp 98.0°F | Resp 20 | Ht 70.0 in | Wt 209.0 lb

## 2022-11-26 DIAGNOSIS — S81801A Unspecified open wound, right lower leg, initial encounter: Secondary | ICD-10-CM | POA: Diagnosis not present

## 2022-11-26 DIAGNOSIS — I739 Peripheral vascular disease, unspecified: Secondary | ICD-10-CM | POA: Insufficient documentation

## 2022-11-26 DIAGNOSIS — S91301A Unspecified open wound, right foot, initial encounter: Secondary | ICD-10-CM

## 2022-11-26 DIAGNOSIS — E1165 Type 2 diabetes mellitus with hyperglycemia: Secondary | ICD-10-CM

## 2022-11-26 DIAGNOSIS — Z794 Long term (current) use of insulin: Secondary | ICD-10-CM | POA: Diagnosis not present

## 2022-11-26 DIAGNOSIS — I890 Lymphedema, not elsewhere classified: Secondary | ICD-10-CM | POA: Diagnosis not present

## 2022-11-26 LAB — VAS US ABI WITH/WO TBI: Right ABI: 1.21

## 2022-12-03 ENCOUNTER — Ambulatory Visit: Payer: Medicaid Other | Admitting: Podiatry

## 2022-12-20 ENCOUNTER — Ambulatory Visit (HOSPITAL_BASED_OUTPATIENT_CLINIC_OR_DEPARTMENT_OTHER): Payer: Medicaid Other | Admitting: Internal Medicine

## 2022-12-29 ENCOUNTER — Ambulatory Visit: Payer: Medicare Other | Admitting: Cardiology

## 2023-01-03 ENCOUNTER — Other Ambulatory Visit: Payer: Medicaid Other | Admitting: *Deleted

## 2023-01-03 ENCOUNTER — Encounter: Payer: Self-pay | Admitting: *Deleted

## 2023-01-03 NOTE — Patient Outreach (Signed)
Medicaid Managed Care   Nurse Care Manager Note  01/03/2023 Name:  Todd Mccoy MRN:  [id number] DOB:  [birth year]  Todd Mccoy is an 52 y.o. year old male who is Mccoy primary patient of Todd Mccoy.  The [facility] Managed Care Coordination team was consulted for assistance with:    CHF HTN  Todd Mccoy was given information about Medicaid Managed Care Coordination team services today. Todd Mccoy Patient agreed to services and verbal consent obtained.  Engaged with patient by telephone for follow up visit in response to provider referral for case management and/or care coordination services.   Assessments/Interventions:  Review of past medical history, allergies, medications, health status, including review of consultants reports, laboratory and other test data, was performed as part of comprehensive evaluation and provision of chronic care management services.  SDOH (Social Determinants of Health) assessments and interventions performed: SDOH Interventions    Flowsheet Row Patient Outreach Telephone from 11/03/2022 in [place] POPULATION HEALTH DEPARTMENT Patient Outreach Telephone from 10/15/2022 in [place] POPULATION HEALTH DEPARTMENT Patient Outreach Telephone from 07/14/2022 in [place] POPULATION HEALTH DEPARTMENT Patient Outreach Telephone from 06/14/2022 in [place] POPULATION HEALTH DEPARTMENT Patient Outreach Telephone from 03/22/2022 in [facility] Care Coordination Patient Outreach Telephone from 09/30/2021 in [facility] Care Coordination  SDOH Interventions        Food Insecurity Interventions Intervention Not Indicated -- Intervention Not Indicated -- -- Intervention Not Indicated  Housing Interventions Intervention Not Indicated -- Intervention Not Indicated -- Intervention Not Indicated Intervention Not Indicated  Transportation Interventions -- Intervention Not Indicated Intervention Not Indicated  Intervention Not Indicated Intervention Not Indicated Intervention Not Indicated  Utilities Interventions -- -- Intervention Not Indicated -- -- --  Financial Strain Interventions -- -- -- -- -- Intervention Not Indicated  Physical Activity Interventions -- -- -- -- -- Intervention Not Indicated  Stress Interventions -- -- -- -- -- Intervention Not Indicated  Social Connections Interventions -- -- -- -- -- Intervention Not Indicated       Care Plan  No Known Allergies  Medications Reviewed Today     Reviewed by Todd Dach, RN (Registered Nurse) on 01/03/23 at 1145  Med List Status: <None>   Medication Order Taking? Sig Documenting Provider Last Dose Status Informant  acyclovir ointment (ZOVIRAX) 5 % [id number] Yes Apply 1 Application topically every 3 (three) hours. Todd Mccoy Taking Active   amLODipine (NORVASC) 10 MG tablet [id number] Yes TAKE ONE TABLET BY MOUTH ONCE DAILY Turner, Cornelious Bryant, Mccoy Taking Active   atorvastatin (LIPITOR) 20 MG tablet [id number] Yes Take 1 tablet (20 mg total) by mouth daily. Todd Mccoy Taking Active   Blood Pressure Monitoring (OMRON 3 SERIES BP MONITOR) DEVI [id number] Yes 1 each by Does not apply route daily. Todd Mccoy Taking Active   calcitRIOL (ROCALTROL) 0.25 MCG capsule [id number] Yes Take 0.25 mcg by mouth daily. Provider, Historical, Mccoy Taking Active   carvedilol (COREG) 25 MG tablet [id number] Yes Take 1.5 tablets (37.5 mg total) by mouth 2 (two) times daily. Todd Islam., Mccoy Taking Active            Med Note Todd Mccoy   Mon Jan 03, 2023 11:34 AM) Taking once daily  cetirizine (ZYRTEC ALLERGY) 10 MG tablet [id number]  Take 1 tablet (10 mg total) by mouth daily. Todd Mccoy  Expired 11/08/22 2359   doxycycline (MONODOX) 50 MG capsule  [id number] Yes Take 50 mg by mouth 2 (two) times daily. Provider, Historical, Mccoy Taking Active   ferrous sulfate 325 (65 FE) MG EC tablet [id number] Yes Take 325 mg by  mouth daily with breakfast. Provider, Historical, Mccoy Taking Active   hydrALAZINE (APRESOLINE) 100 MG tablet [id number] Yes Take 1 tablet (100 mg total) by mouth 2 (two) times daily. Todd Islam., Mccoy Taking Active   hydrOXYzine (ATARAX) 25 MG tablet [id number] No Take 1 tablet (25 mg total) by mouth at bedtime as needed.  Patient not taking: Reported on 01/03/2023   Todd Mccoy Not Taking Active   ibuprofen (ADVIL) 600 MG tablet [id number] Yes Take 1 tablet (600 mg total) by mouth every 6 (six) hours as needed. Todd Mccoy Taking Active     Discontinued 05/13/20 1120   insulin glargine (LANTUS) 100 UNIT/ML Solostar Pen [id number] Yes Inject 10 Units into the skin at bedtime. Todd Mccoy Taking Active            Med Note (Manuelita Moxon Mccoy   Mon Jan 03, 2023 11:37 AM) Taking 4 units at bedtime  isosorbide mononitrate (IMDUR) 60 MG 24 hr tablet [id number] Yes TAKE ONE TABLET BY MOUTH ONCE DAILY Todd Mccoy Taking Active   MAGNESIUM-OXIDE 400 (240 Mg) MG tablet [id number] Yes Take 1 tablet by mouth daily. Provider, Historical, Mccoy Taking Active     Discontinued 12/22/20 1205 (Stop Taking at Discharge)   Naphazoline HCl (CLEAR EYES OP) [id number] Yes Place 1 drop into both eyes as needed (irritation). Provider, Historical, Mccoy Taking Active Self  pregabalin (LYRICA) 50 MG capsule [id number] Yes Take 1 capsule by mouth twice daily Todd Mccoy Taking Active   Torsemide 40 MG TABS [id number] Yes Take 80 mg (2 tablets) by mouth once Mccoy day Todd Islam., Mccoy Taking Active             Patient Active Problem List   Diagnosis Date Noted   PAD (peripheral artery disease) (HCC) 10/27/2022   Renal insufficiency    Acute on chronic systolic CHF (congestive heart failure) (HCC) 01/23/2022   Prolonged QT interval 01/23/2022   Chest pain 01/23/2022   Elevated troponin 01/23/2022   Anemia due to chronic kidney disease 01/23/2022   Hypertensive emergency 01/23/2022    Hyponatremia 01/23/2022   Chronic combined systolic (congestive) and diastolic (congestive) heart failure (HCC) 04/07/2021   Chronic renal disease, stage 4, severely decreased glomerular filtration rate between 15-29 mL/min/1.73 square meter (HCC)    Hypertensive urgency    Acute kidney injury superimposed on chronic kidney disease (HCC)    Acute CHF (congestive heart failure) (HCC) 02/27/2021   Normocytic anemia 02/27/2021   Anasarca 02/27/2021   Abscess of left foot 12/10/2020   S/P BKA (below knee amputation) unilateral, left (HCC) 12/10/2020   Dyslipidemia 12/10/2020   CKD (chronic kidney disease), stage III (HCC) 12/10/2020   HTN (hypertension) 11/12/2020   Cellulitis and abscess of foot    Diabetes mellitus with hyperglycemia (HCC) 09/07/2014   Tobacco abuse 09/07/2014   Abscess of back    Abscess of lower back 09/06/2014   DM2 (diabetes mellitus, type 2) (HCC) 09/06/2014   Sepsis (HCC) 09/06/2014   Scrotal abscess 06/20/2014    Conditions to be addressed/monitored per PCP order:  CHF and HTN  Care Plan : RN Care Manager Plan of Care  Updates made by Todd Dach, RN since 01/03/2023 12:00 AM     Problem: Chronic Disease  Management and Care Coordination Needs for CHF, HTN, DM   Priority: High     Long-Range Goal: Development of Plan of Care for Chronic Disease Management and Care Coordination Needs (CHF, HTN, DM)   Start Date: 09/30/2021  Expected End Date: 03/04/2023  Priority: High  Note:   Current Barriers:  Knowledge Deficits related to plan of care for management of CHF, HTN, and DMII  Chronic Disease Management support and education needs related to CHF, HTN, and DMII Difficulty obtaining medications Recent Vision 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  RNCM Clinical Goal(s):  Patient will verbalize understanding of plan for management of CHF, HTN, and DMII as evidenced by improvement in management of these chronic  diseases. verbalize basic understanding of CHF, HTN, and DMII disease process and self health management plan as evidenced by improved control of factors that cause CHF, improved blood pressure readings < 140/90 and improved control of blood sugar readings with range with 80 - 120. take all medications exactly as prescribed and will call provider for medication related questions as evidenced by compliance with medications    attend all scheduled medical appointments:  11/19/22 for Lung CT and 11/26/22 with VVS and 12/29/22 with Cardiology as evidenced by attending all scheduled appointments        demonstrate improved adherence to prescribed treatment plan for CHF, HTN, and DMII as evidenced by better control of blood pressure, blood sugars and factors impacting CHF. continue to work with Medical illustrator and/or Social Worker to address care management and care coordination needs related to CHF, HTN, and DMII as evidenced by adherence to CM Team Scheduled appointments     through collaboration with Medical illustrator, provider, and care team.   Interventions: Inter-disciplinary care team collaboration (see longitudinal plan of care) Evaluation of current treatment plan related to  self management and patient's adherence to plan as established by provider Verified referral placed and advised patient to call and schedule visit [facility] with MyChart sign up-resent link to cell phone Collaborated with Pharmacist regarding patient medication concerns  Heart Failure Interventions:  (Status: Goal on Track (progressing): YES.)  Long Term Goal  Wt Readings from Last 3 Encounters:  11/26/22 209 lb (94.8 kg)  10/09/22 209 lb (94.8 kg)  09/21/22 209 lb (94.8 kg)    Reviewed Heart Failure Action Plan in depth and provided written copy Discussed importance of daily weight and advised patient to weigh and record daily Discussed the importance of keeping all appointments with  provider Assessed social determinant of health barriers Reviewed medications Advised patient to take all medications and BP readings to all upcoming appointments Reviewed upcoming appointments including appointments listed above and verified patient has transportation-provided details to patient and family   Hypertension: (Status: Condition stable. Not addressed this visit.) Long Term Goal Patient will have someone pick up Amlodipine today Last practice recorded BP readings:  BP Readings from Last 3 Encounters:  11/26/22 (!) 172/105  10/27/22 128/80  10/09/22 (!) 162/102  Most recent eGFR/CrCl:  Lab Results  Component Value Date   EGFR 12 (L) 10/05/2022    No components found for: "CRCL"  Provided education to patient re: stroke prevention, s/s of heart attack and stroke; Reviewed medications with patient and discussed importance of compliance;  Advised patient, providing education and rationale, to monitor blood pressure daily and record, calling PCP for findings outside established parameters;  Reviewed scheduled/upcoming provider appointments  including: 01/19/23 with Wound Care, 01/28/23 with VVS and reschedule with [facility] Discussed the importance of checking BP at home Ensured patient received Amlodipine and now taking as directed Advised patient call pharmacy of choice for refills, request all medications transferred to preferred pharmacy Advised patient to take medications as directed and check BP daily at the same time of day, record and take to provider visit    Patient Goals/Self-Care Activities: Take medications as prescribed   Attend all scheduled provider appointments Call pharmacy for medication refills 3-7 days in advance of running out of medications Call provider office for new concerns or questions  check blood sugar at prescribed times: once daily check blood pressure daily limit salt intake to 2000 mg/day       Follow Up:  Patient agrees to Care Plan and  Follow-up.  Plan: The Managed Medicaid care management team will reach out to the patient again over the next 30 days.  Date/time of next scheduled RN care management/care coordination outreach:  02/09/23 @ 1:15pm  Estanislado Emms RN, [name] [place]  Managed [facility] RN Care Coordinator [phone]

## 2023-01-03 NOTE — Patient Instructions (Signed)
Visit Information  Todd Mccoy was given information about Medicaid Managed Care team care coordination services as a part of their [facility] Medicaid benefit. Todd Mccoy verbally consented to engagement with the [facility] Managed Care team.   If you are experiencing a medical emergency, please call 911 or report to your local emergency department or urgent care.   If you have a non-emergency medical problem during routine business hours, please contact your provider's office and ask to speak with a nurse.   For questions related to your [facility] health plan, please call: [phone] or go here:[email or web address]  If you would like to schedule transportation through your [facility] plan, please call the following number at least 2 days in advance of your appointment: [phone].   You can also use the MTM portal or MTM mobile app to manage your rides. Reimbursement for transportation is available through [facility]! For the portal, please go to [email or web address]  Call the [facility] Crisis Line at [phone], at any time, 24 hours a day, 7 days a week. If you are in danger or need immediate medical attention call 911.  If you would like help to quit smoking, call 1-800-QUIT-NOW (949-650-5518) OR Espaol: 1-855-Djelo-Ya (4-132-440-1027) o para ms informacin haga clic aqu or Text READY to 253-664 to register via text  Todd Mccoy,   Please see education materials related to HTN provided as print materials.   The patient verbalized understanding of instructions, educational materials, and care plan provided today and agreed to receive a mailed copy of patient instructions, educational materials, and care plan.   Telephone follow up appointment with Managed Medicaid care management team member scheduled for:02/09/23 @ 1:15 pm  Todd Emms RN, [name] Todd Mccoy  Managed [facility] RN Care Coordinator [phone]   Following is a copy of your  plan of care:  Care Plan : RN Care Manager Plan of Care  Updates made by Todd Dach, RN since 01/03/2023 12:00 AM     Problem: Chronic Disease Management and Care Coordination Needs for CHF, HTN, DM   Priority: High     Long-Range Goal: Development of Plan of Care for Chronic Disease Management and Care Coordination Needs (CHF, HTN, DM)   Start Date: 09/30/2021  Expected End Date: 03/04/2023  Priority: High  Note:   Current Barriers:  Knowledge Deficits related to plan of care for management of CHF, HTN, and DMII  Chronic Disease Management support and education needs related to CHF, HTN, and DMII Difficulty obtaining medications Recent Vision 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  RNCM Clinical Goal(s):  Patient will verbalize understanding of plan for management of CHF, HTN, and DMII as evidenced by improvement in management of these chronic diseases. verbalize basic understanding of CHF, HTN, and DMII disease process and self health management plan as evidenced by improved control of factors that cause CHF, improved blood pressure readings < 140/90 and improved control of blood sugar readings with range with 80 - 120. take all medications exactly as prescribed and will call provider for medication related questions as evidenced by compliance with medications    attend all scheduled medical appointments:  11/19/22 for Lung CT and 11/26/22 with [facility] and 12/29/22 with Cardiology as evidenced by attending all scheduled appointments        demonstrate improved adherence to prescribed treatment plan for CHF, HTN, and DMII as evidenced by better control of blood pressure, blood sugars and factors impacting CHF. continue to  work with Pharmacist, community to address care management and care coordination needs related to CHF, HTN, and DMII as evidenced by adherence to CM Team Scheduled appointments     through collaboration with Marine scientist, provider, and care team.   Interventions: Inter-disciplinary care team collaboration (see longitudinal plan of care) Evaluation of current treatment plan related to  self management and patient's adherence to plan as established by provider Verified referral placed and advised patient to call and schedule visit with [facility]isted with MyChart sign up-resent link to cell phone Collaborated with Pharmacist regarding patient medication concerns  Heart Failure Interventions:  (Status: Goal on Track (progressing): YES.)  Long Term Goal  Wt Readings from Last 3 Encounters:  11/26/22 209 lb (94.8 kg)  10/09/22 209 lb (94.8 kg)  09/21/22 209 lb (94.8 kg)    Reviewed Heart Failure Action Plan in depth and provided written copy Discussed importance of daily weight and advised patient to weigh and record daily Discussed the importance of keeping all appointments with provider Assessed social determinant of health barriers Reviewed medications Advised patient to take all medications and BP readings to all upcoming appointments Reviewed upcoming appointments including appointments listed above and verified patient has transportation-provided details to patient and family   Hypertension: (Status: Condition stable. Not addressed this visit.) Long Term Goal Patient will have someone pick up Amlodipine today Last practice recorded BP readings:  BP Readings from Last 3 Encounters:  11/26/22 (!) 172/105  10/27/22 128/80  10/09/22 (!) 162/102  Most recent eGFR/CrCl:  Lab Results  Component Value Date   EGFR 12 (L) 10/05/2022    No components found for: "CRCL"  Provided education to patient re: stroke prevention, s/s of heart attack and stroke; Reviewed medications with patient and discussed importance of compliance;  Advised patient, providing education and rationale, to monitor blood pressure daily and record, calling PCP for findings outside established  parameters;  Reviewed scheduled/upcoming provider appointments including: 01/19/23 with Wound Care, 01/28/23 with [facility] and reschedule with [facility] Discussed the importance of checking BP at home Ensured patient received Amlodipine and now taking as directed Advised patient call pharmacy of choice for refills, request all medications transferred to preferred pharmacy Advised patient to take medications as directed and check BP daily at the same time of day, record and take to provider visit    Patient Goals/Self-Care Activities: Take medications as prescribed   Attend all scheduled provider appointments Call pharmacy for medication refills 3-7 days in advance of running out of medications Call provider office for new concerns or questions  check blood sugar at prescribed times: once daily check blood pressure daily limit salt intake to 2000 mg/day

## 2023-01-04 ENCOUNTER — Telehealth: Payer: Self-pay

## 2023-01-04 NOTE — Progress Notes (Signed)
   01/04/2023  Patient ID: Todd Mccoy, male   DOB: Oct 02, 1970, 52 y.o.   MRN: [id number]  Patient outreach in response to message from Nurse Care Manager stating patient was having concerns with potential medication adverse effects.  - Patient states that he has been experiencing feelings of "being stuck in a dream" when he wakes up in the morning - States that it is hard to wake up and he is having visual hallucinations - He is unable to described the visual disturbances and states that this resolves in approximately 20 minutes - This has been going on since February intermittently  - He does not endorse alcohol or drug use or any prescription medication use outside of what is on his med list - He states if he "slows down on medications" these symptoms improve, but he is not clear about what that means.  He does state he has stopped each medication periodically but cannot make a correlation between a specific medication and these symptoms. - Patient states he did have a retina operation in the past and this could be an ophthalmology issue versus a medication side effect - Upon reviewing Todd Mccoy's medication list, the only medication I would suspect that could be causing any change in mental status is pregabalin.  Patient has been on this medication for approximately two years and did not develop reported symptoms until this February - Consulting patient's PCP, as I recommend work up to evaluate for other causes.  Patient may also benefit from referral to neurology and/or ophthalmology.  Lenna Gilford, PharmD, DPLA

## 2023-01-11 ENCOUNTER — Other Ambulatory Visit: Payer: Self-pay | Admitting: Family Medicine

## 2023-01-19 ENCOUNTER — Ambulatory Visit (HOSPITAL_BASED_OUTPATIENT_CLINIC_OR_DEPARTMENT_OTHER): Payer: Self-pay | Admitting: General Surgery

## 2023-01-24 ENCOUNTER — Telehealth: Payer: Self-pay

## 2023-01-24 NOTE — Telephone Encounter (Signed)
fyi

## 2023-01-24 NOTE — Telephone Encounter (Signed)
Copied from CRM [id number]. Topic: Referral - Status >> Jan 24, 2023  9:29 AM Everette C wrote: Reason for CRM: The patient has been in contact with the [facility]  The patient has been told that no referral has been placed for them and directed to contact their PCP   The patient would like Referral #[id number] resubmitted when possible  Please contact further when available

## 2023-01-28 ENCOUNTER — Ambulatory Visit: Payer: Medicaid Other | Admitting: Vascular Surgery

## 2023-02-02 NOTE — Progress Notes (Deleted)
Office Note     CC:  Right foot PAD. History of left AKA. History of diabetes, CHF  Requesting Provider:  [name], MD  HPI: Todd Mccoy is a 52 y.o. ([birth year]) male presenting at the request of .[name], MD for evaluation of right foot wounds.  On exam, Todd Mccoy was doing well, accompanied by his son.  A native of [place], he moved to [place] years ago.  Todd Mccoy is mainly wheelchair-bound after undergoing left above-knee amputation.  Since that time, he has appreciated significant swelling in the right lower extremity.  This is led to wounds both on the calf and on the lateral aspect of the heel.  His niece has been helping him with wound care.  The pt is  on a statin for cholesterol management.  The pt is not on a daily aspirin.   Other AC:  - The pt is on medication for hypertension.   The pt is  diabetic.  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/2020   pt states recent MD visit /left eye worse than right   Dyslipidemia 12/10/2020   Hx of BKA, left (HCC)    Hypertension    Morbid obesity (HCC)    Osteomyelitis (HCC)    Pneumonia     Past Surgical History:  Procedure Laterality Date   ABSCESS DRAINAGE     neck   AMPUTATION Left 11/14/2020   Procedure: LEFT 5TH RAY AMPUTATION;  Surgeon: Nadara Mustard, MD;  Location: [facility] OR;  Service: Orthopedics;  Laterality: Left;   AMPUTATION Left 12/10/2020   Procedure: LEFT BELOW KNEE AMPUTATION;  Surgeon:  Nadara Mustard, MD;  Location: [facility] OR;  Service: Orthopedics;  Laterality: Left;   AMPUTATION TOE Right 03/25/2020   Procedure: AMPUTATION TOE;  Surgeon: Park Liter, DPM;  Location: [facility] ORS;  Service: Podiatry;  Laterality: Right;   INCISION AND DRAINAGE ABSCESS Right 09/07/2014   Procedure: INCISION AND DRAINAGE ABSCESS RIGHT FLANK;  Surgeon: Avel Peace, MD;  Location: [facility] ORS;  Service: General;  Laterality: Right;   LEG AMPUTATION BELOW KNEE Left 12/10/2020   SCROTUM EXPLORATION      Social History   Socioeconomic History   Marital status: Single    Spouse name: Not on file   Number of children: 1   Years of education: Not on file   Highest education level: Not on file  Occupational History   Not on file  Tobacco Use   Smoking status: Every Day    Current packs/day: 0.00    Average packs/day: 0.5 packs/day for 32.0 years (16.0 ttl pk-yrs)    Types: Cigarettes    Start date: 08/24/1989    Last attempt to quit: 08/24/2021    Years since quitting: 1.4    Passive exposure: Past   Smokeless tobacco: Former   Tobacco comments:    Quit smoking previously February 2023  Vaping Use  Vaping status: Never Used  Substance and Sexual Activity   Alcohol use: No   Drug use: No   Sexual activity: Not on file  Other Topics Concern   Not on file  Social History Narrative   Not on file   Social Determinants of Health   Financial Resource Strain: Low Risk  (09/30/2021)   Overall Financial Resource Strain (CARDIA)    Difficulty of Paying Living Expenses: Not hard at all  Food Insecurity: No Food Insecurity (11/03/2022)   Hunger Vital Sign    Worried About Running Out of Food in the Last Year: Never true    Ran Out of Food in the Last Year: Never true  Transportation Needs: No Transportation Needs (10/15/2022)   PRAPARE - Administrator, Civil Service (Medical): No    Lack of Transportation (Non-Medical): No  Physical Activity: Inactive (09/30/2021)   Exercise Vital  Sign    Days of Exercise per Week: 0 days    Minutes of Exercise per Session: 0 min  Stress: No Stress Concern Present (09/30/2021)   Todd Mccoy of Occupational Health - Occupational Stress Questionnaire    Feeling of Stress : Only a little  Social Connections: Moderately Isolated (09/30/2021)   Social Connection and Isolation Panel [NHANES]    Frequency of Communication with Friends and Family: More than three times a week    Frequency of Social Gatherings with Friends and Family: More than three times a week    Attends Religious Services: 1 to 4 times per year    Active Member of [employer] or Organizations: No    Attends Engineer, structural: Never    Marital Status: Never married  Catering manager Violence: Not on file   Family History  Problem Relation Age of Onset   Diabetes Mother    Diabetes Father    Diabetes Brother    Colon cancer Neg Hx    Esophageal cancer Neg Hx    Pancreatic cancer Neg Hx    Stomach cancer Neg Hx    Liver disease Neg Hx    CAD Neg Hx     Current Outpatient Medications  Medication Sig Dispense Refill   acyclovir ointment (ZOVIRAX) 5 % Apply 1 Application topically every 3 (three) hours. 15 g 0   amLODipine (NORVASC) 10 MG tablet TAKE ONE TABLET BY MOUTH ONCE DAILY 90 tablet 2   atorvastatin (LIPITOR) 20 MG tablet Take 1 tablet (20 mg total) by mouth daily. 90 tablet 3   Blood Pressure Monitoring (OMRON 3 SERIES BP MONITOR) DEVI 1 each by Does not apply route daily. 1 each 0   calcitRIOL (ROCALTROL) 0.25 MCG capsule Take 0.25 mcg by mouth daily.     carvedilol (COREG) 25 MG tablet Take 1.5 tablets (37.5 mg total) by mouth 2 (two) times daily. 90 tablet 3   cetirizine (ZYRTEC ALLERGY) 10 MG tablet Take 1 tablet (10 mg total) by mouth daily. 30 tablet 0   doxycycline (MONODOX) 50 MG capsule Take 50 mg by mouth 2 (two) times daily.     ferrous sulfate 325 (65 FE) MG EC tablet Take 325 mg by mouth daily with breakfast.     hydrALAZINE  (APRESOLINE) 100 MG tablet Take 1 tablet (100 mg total) by mouth 2 (two) times daily. 60 tablet 3   hydrOXYzine (ATARAX) 25 MG tablet Take 1 tablet (25 mg total) by mouth at bedtime as needed. (Patient not taking: Reported on 01/03/2023) 30 tablet 1   ibuprofen (ADVIL)  600 MG tablet Take 1 tablet (600 mg total) by mouth every 6 (six) hours as needed. 30 tablet 0   insulin glargine (LANTUS) 100 UNIT/ML Solostar Pen Inject 10 Units into the skin at bedtime. 9 mL 3   isosorbide mononitrate (IMDUR) 60 MG 24 hr tablet TAKE ONE TABLET BY MOUTH ONCE DAILY 30 tablet 3   MAGNESIUM-OXIDE 400 (240 Mg) MG tablet Take 1 tablet by mouth daily.     Naphazoline HCl (CLEAR EYES OP) Place 1 drop into both eyes as needed (irritation).     pregabalin (LYRICA) 50 MG capsule Take 1 capsule by mouth twice daily 60 capsule 3   Torsemide 40 MG TABS Take 80 mg (2 tablets) by mouth once a day 60 tablet 3   No current facility-administered medications for this visit.    No Known Allergies   REVIEW OF SYSTEMS:  [X]  denotes positive finding, [ ]  denotes negative finding Cardiac  Comments:  Chest pain or chest pressure:    Shortness of breath upon exertion:    Short of breath when lying flat:    Irregular heart rhythm:        Vascular    Pain in calf, thigh, or hip brought on by ambulation:    Pain in feet at night that wakes you up from your sleep:     Blood clot in your veins:    Leg swelling:         Pulmonary    Oxygen at home:    Productive cough:     Wheezing:         Neurologic    Sudden weakness in arms or legs:     Sudden numbness in arms or legs:     Sudden onset of difficulty speaking or slurred speech:    Temporary loss of vision in one eye:     Problems with dizziness:         Gastrointestinal    Blood in stool:     Vomited blood:         Genitourinary    Burning when urinating:     Blood in urine:        Psychiatric    Major depression:         Hematologic    Bleeding problems:     Problems with blood clotting too easily:        Skin    Rashes or ulcers:        Constitutional    Fever or chills:      PHYSICAL EXAMINATION:  There were no vitals filed for this visit.  General:  WDWN in NAD; vital signs documented above Gait: Not observed HENT: WNL, normocephalic Pulmonary: normal non-labored breathing , without wheezing Cardiac: regular HR Abdomen: soft, NT, no masses Skin: without rashes Vascular Exam/Pulses:  Right Left  Radial 2+ (normal) 2+ (normal)  Ulnar    Femoral    Popliteal    DP  bka  PT 2+ (normal)    Extremities: without ischemic changes, without Gangrene , without cellulitis; with open wounds;  Buffalo hump   Musculoskeletal: no muscle wasting or atrophy  Neurologic: A&O X 3;  No focal weakness or paresthesias are detected Psychiatric:  The pt has Normal affect.   Non-Invasive Vascular Imaging:   ABI Findings:  +---------+------------------+-----+---------+--------+  Right   Rt Pressure (mmHg)IndexWaveform Comment   +---------+------------------+-----+---------+--------+  Brachial 197                                       +---------+------------------+-----+---------+--------+  PTA     238               1.21 triphasic          +---------+------------------+-----+---------+--------+  DP      208               1.06 biphasic           +---------+------------------+-----+---------+--------+  Great Toe194               0.98 Normal             +---------+------------------+-----+---------+--------+   +--------+------------------+-----+--------+-------+  Left   Lt Pressure (mmHg)IndexWaveformComment  +--------+------------------+-----+--------+-------+  [id number]                                    +--------+------------------+-----+--------+-------+  PTA                                   BKA      +--------+------------------+-----+--------+-------+  DP                                     BKA      +--------+------------------+-----+--------+-------+   +-------+-----------+-----------+------------+------------+  ABI/TBIToday's ABIToday's TBIPrevious ABIPrevious TBI  +-------+-----------+-----------+------------+------------+  Right 1.21       0.98       1.07        0             +-------+-----------+-----------+------------+------------+  Left  BKA        BKA        1.07                      +-------+-----------+-----------+------------+------------+      ASSESSMENT/PLAN: MUKTAR BLOMBERG is a 52 y.o. male presenting with tibial and lateral malleolar wounds associated with edema and fluid overload in the right leg. ABI was reviewed demonstrating relatively normal waveforms with a falsely elevated toe pressure.  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  Wounds are present on the pretibial area as well as the right heel.  The wound care he has received has been excellent, however Angelina needs elevation and compression.  He would benefit from wound center referral for possible Unna boot.  With current ABIs, I do not think he requires revascularization at this time.  Should his wounds worsen, I would offer lower extremity angiogram.  My plan is to see him back in the office in 2 months to assess improvement I asked that he elevate his leg when possible.  Recommend the following which can slow the progression of atherosclerosis and reduce the risk of major adverse cardiac / limb events:  Aspirin 81mg  PO QD.  Atorvastatin 40-80mg  PO QD (or other "high intensity" statin therapy). Complete cessation from all tobacco products. Blood glucose control with goal A1c < 7%. Blood pressure control with goal blood pressure < 140/90 mmHg. Lipid reduction therapy with goal LDL-C <100 mg/dL (<98 if symptomatic from PAD).     Victorino Sparrow, MD [facility] [phone]

## 2023-02-04 ENCOUNTER — Emergency Department (HOSPITAL_COMMUNITY): Payer: Medicare Other

## 2023-02-04 ENCOUNTER — Encounter (HOSPITAL_COMMUNITY): Payer: Self-pay | Admitting: Emergency Medicine

## 2023-02-04 ENCOUNTER — Other Ambulatory Visit: Payer: Self-pay

## 2023-02-04 ENCOUNTER — Inpatient Hospital Stay (HOSPITAL_COMMUNITY)
Admission: EM | Admit: 2023-02-04 | Discharge: 2023-02-14 | DRG: 291 | Disposition: A | Discharge status: Other Acute Inpatient Hospital | Payer: Medicare Other | Attending: Internal Medicine | Admitting: Internal Medicine

## 2023-02-04 ENCOUNTER — Ambulatory Visit: Payer: Medicare Other | Admitting: Vascular Surgery

## 2023-02-04 DIAGNOSIS — L97519 Non-pressure chronic ulcer of other part of right foot with unspecified severity: Secondary | ICD-10-CM | POA: Diagnosis present

## 2023-02-04 DIAGNOSIS — R197 Diarrhea, unspecified: Secondary | ICD-10-CM | POA: Diagnosis not present

## 2023-02-04 DIAGNOSIS — E1165 Type 2 diabetes mellitus with hyperglycemia: Secondary | ICD-10-CM | POA: Diagnosis present

## 2023-02-04 DIAGNOSIS — E860 Dehydration: Secondary | ICD-10-CM

## 2023-02-04 DIAGNOSIS — E861 Hypovolemia: Secondary | ICD-10-CM | POA: Diagnosis present

## 2023-02-04 DIAGNOSIS — R570 Cardiogenic shock: Secondary | ICD-10-CM | POA: Diagnosis present

## 2023-02-04 DIAGNOSIS — R188 Other ascites: Secondary | ICD-10-CM | POA: Diagnosis present

## 2023-02-04 DIAGNOSIS — N186 End stage renal disease: Secondary | ICD-10-CM | POA: Diagnosis present

## 2023-02-04 DIAGNOSIS — I132 Hypertensive heart and chronic kidney disease with heart failure and with stage 5 chronic kidney disease, or end stage renal disease: Secondary | ICD-10-CM | POA: Diagnosis present

## 2023-02-04 DIAGNOSIS — E875 Hyperkalemia: Secondary | ICD-10-CM | POA: Diagnosis present

## 2023-02-04 DIAGNOSIS — Z833 Family history of diabetes mellitus: Secondary | ICD-10-CM

## 2023-02-04 DIAGNOSIS — R579 Shock, unspecified: Secondary | ICD-10-CM | POA: Diagnosis present

## 2023-02-04 DIAGNOSIS — E11319 Type 2 diabetes mellitus with unspecified diabetic retinopathy without macular edema: Secondary | ICD-10-CM | POA: Diagnosis present

## 2023-02-04 DIAGNOSIS — E1122 Type 2 diabetes mellitus with diabetic chronic kidney disease: Secondary | ICD-10-CM | POA: Diagnosis present

## 2023-02-04 DIAGNOSIS — F419 Anxiety disorder, unspecified: Secondary | ICD-10-CM | POA: Diagnosis present

## 2023-02-04 DIAGNOSIS — E1142 Type 2 diabetes mellitus with diabetic polyneuropathy: Secondary | ICD-10-CM | POA: Diagnosis present

## 2023-02-04 DIAGNOSIS — E1151 Type 2 diabetes mellitus with diabetic peripheral angiopathy without gangrene: Secondary | ICD-10-CM | POA: Diagnosis present

## 2023-02-04 DIAGNOSIS — N179 Acute kidney failure, unspecified: Secondary | ICD-10-CM

## 2023-02-04 DIAGNOSIS — R001 Bradycardia, unspecified: Secondary | ICD-10-CM | POA: Diagnosis present

## 2023-02-04 DIAGNOSIS — A419 Sepsis, unspecified organism: Principal | ICD-10-CM | POA: Diagnosis present

## 2023-02-04 DIAGNOSIS — E11649 Type 2 diabetes mellitus with hypoglycemia without coma: Secondary | ICD-10-CM | POA: Diagnosis not present

## 2023-02-04 DIAGNOSIS — N189 Chronic kidney disease, unspecified: Secondary | ICD-10-CM

## 2023-02-04 DIAGNOSIS — L299 Pruritus, unspecified: Secondary | ICD-10-CM | POA: Diagnosis present

## 2023-02-04 DIAGNOSIS — F17210 Nicotine dependence, cigarettes, uncomplicated: Secondary | ICD-10-CM | POA: Diagnosis present

## 2023-02-04 DIAGNOSIS — Z794 Long term (current) use of insulin: Secondary | ICD-10-CM

## 2023-02-04 DIAGNOSIS — Z993 Dependence on wheelchair: Secondary | ICD-10-CM

## 2023-02-04 DIAGNOSIS — D696 Thrombocytopenia, unspecified: Secondary | ICD-10-CM | POA: Diagnosis present

## 2023-02-04 DIAGNOSIS — Z89512 Acquired absence of left leg below knee: Secondary | ICD-10-CM

## 2023-02-04 DIAGNOSIS — Z8701 Personal history of pneumonia (recurrent): Secondary | ICD-10-CM

## 2023-02-04 DIAGNOSIS — R531 Weakness: Principal | ICD-10-CM

## 2023-02-04 DIAGNOSIS — N400 Enlarged prostate without lower urinary tract symptoms: Secondary | ICD-10-CM | POA: Diagnosis present

## 2023-02-04 DIAGNOSIS — H548 Legal blindness, as defined in USA: Secondary | ICD-10-CM | POA: Diagnosis present

## 2023-02-04 DIAGNOSIS — F39 Unspecified mood [affective] disorder: Secondary | ICD-10-CM | POA: Diagnosis present

## 2023-02-04 DIAGNOSIS — R011 Cardiac murmur, unspecified: Secondary | ICD-10-CM | POA: Diagnosis present

## 2023-02-04 DIAGNOSIS — E8721 Acute metabolic acidosis: Secondary | ICD-10-CM | POA: Diagnosis present

## 2023-02-04 DIAGNOSIS — Z992 Dependence on renal dialysis: Secondary | ICD-10-CM

## 2023-02-04 DIAGNOSIS — G9341 Metabolic encephalopathy: Secondary | ICD-10-CM

## 2023-02-04 DIAGNOSIS — D638 Anemia in other chronic diseases classified elsewhere: Secondary | ICD-10-CM

## 2023-02-04 DIAGNOSIS — A09 Infectious gastroenteritis and colitis, unspecified: Secondary | ICD-10-CM | POA: Diagnosis present

## 2023-02-04 DIAGNOSIS — Z91199 Patient's noncompliance with other medical treatment and regimen due to unspecified reason: Secondary | ICD-10-CM

## 2023-02-04 DIAGNOSIS — G9340 Encephalopathy, unspecified: Secondary | ICD-10-CM

## 2023-02-04 DIAGNOSIS — R6521 Severe sepsis with septic shock: Secondary | ICD-10-CM | POA: Diagnosis present

## 2023-02-04 DIAGNOSIS — E11621 Type 2 diabetes mellitus with foot ulcer: Secondary | ICD-10-CM | POA: Diagnosis present

## 2023-02-04 DIAGNOSIS — N390 Urinary tract infection, site not specified: Secondary | ICD-10-CM | POA: Diagnosis present

## 2023-02-04 DIAGNOSIS — E872 Acidosis, unspecified: Secondary | ICD-10-CM

## 2023-02-04 DIAGNOSIS — I5082 Biventricular heart failure: Secondary | ICD-10-CM | POA: Diagnosis present

## 2023-02-04 DIAGNOSIS — R571 Hypovolemic shock: Secondary | ICD-10-CM | POA: Diagnosis present

## 2023-02-04 DIAGNOSIS — D631 Anemia in chronic kidney disease: Secondary | ICD-10-CM | POA: Diagnosis present

## 2023-02-04 DIAGNOSIS — R319 Hematuria, unspecified: Secondary | ICD-10-CM | POA: Diagnosis present

## 2023-02-04 DIAGNOSIS — I5043 Acute on chronic combined systolic (congestive) and diastolic (congestive) heart failure: Secondary | ICD-10-CM | POA: Diagnosis present

## 2023-02-04 DIAGNOSIS — Z89421 Acquired absence of other right toe(s): Secondary | ICD-10-CM

## 2023-02-04 DIAGNOSIS — Z79899 Other long term (current) drug therapy: Secondary | ICD-10-CM

## 2023-02-04 DIAGNOSIS — I3139 Other pericardial effusion (noninflammatory): Secondary | ICD-10-CM | POA: Diagnosis present

## 2023-02-04 DIAGNOSIS — E785 Hyperlipidemia, unspecified: Secondary | ICD-10-CM | POA: Diagnosis present

## 2023-02-04 LAB — CBC WITH DIFFERENTIAL/PLATELET
Abs Immature Granulocytes: 0.04 10*3/uL (ref 0.00–0.07)
Basophils Absolute: 0 10*3/uL (ref 0.0–0.1)
Basophils Relative: 0 %
Eosinophils Absolute: 0 10*3/uL (ref 0.0–0.5)
Eosinophils Relative: 0 %
HCT: 28 % — ABNORMAL LOW (ref 39.0–52.0)
Hemoglobin: 8.4 g/dL — ABNORMAL LOW (ref 13.0–17.0)
Immature Granulocytes: 1 %
Lymphocytes Relative: 5 %
Lymphs Abs: 0.4 10*3/uL — ABNORMAL LOW (ref 0.7–4.0)
MCH: 26.3 pg (ref 26.0–34.0)
MCHC: 30 g/dL (ref 30.0–36.0)
MCV: 87.8 fL (ref 80.0–100.0)
Monocytes Absolute: 0.4 10*3/uL (ref 0.1–1.0)
Monocytes Relative: 6 %
Neutro Abs: 6.2 10*3/uL (ref 1.7–7.7)
Neutrophils Relative %: 88 %
Platelets: 107 10*3/uL — ABNORMAL LOW (ref 150–400)
RBC: 3.19 MIL/uL — ABNORMAL LOW (ref 4.22–5.81)
RDW: 23.1 % — ABNORMAL HIGH (ref 11.5–15.5)
WBC: 7 10*3/uL (ref 4.0–10.5)
nRBC: 0.6 % — ABNORMAL HIGH (ref 0.0–0.2)

## 2023-02-04 LAB — COMPREHENSIVE METABOLIC PANEL
ALT: 12 U/L (ref 0–44)
AST: 11 U/L — ABNORMAL LOW (ref 15–41)
Albumin: 2.7 g/dL — ABNORMAL LOW (ref 3.5–5.0)
Alkaline Phosphatase: 123 U/L (ref 38–126)
Anion gap: 13 (ref 5–15)
BUN: 107 mg/dL — ABNORMAL HIGH (ref 6–20)
CO2: 11 mmol/L — ABNORMAL LOW (ref 22–32)
Calcium: 7.7 mg/dL — ABNORMAL LOW (ref 8.9–10.3)
Chloride: 113 mmol/L — ABNORMAL HIGH (ref 98–111)
Creatinine, Ser: 8.86 mg/dL — ABNORMAL HIGH (ref 0.61–1.24)
GFR, Estimated: 7 mL/min — ABNORMAL LOW (ref >60)
Glucose, Bld: 138 mg/dL — ABNORMAL HIGH (ref 70–99)
Potassium: 5.4 mmol/L — ABNORMAL HIGH (ref 3.5–5.1)
Sodium: 137 mmol/L (ref 135–145)
Total Bilirubin: 0.6 mg/dL (ref 0.3–1.2)
Total Protein: 7.1 g/dL (ref 6.5–8.1)

## 2023-02-04 LAB — LIPASE, BLOOD: Lipase: 46 U/L (ref 11–51)

## 2023-02-04 LAB — BRAIN NATRIURETIC PEPTIDE: B Natriuretic Peptide: 2740.2 pg/mL — ABNORMAL HIGH (ref 0.0–100.0)

## 2023-02-04 LAB — I-STAT CG4 LACTIC ACID, ED: Lactic Acid, Venous: 0.6 mmol/L (ref 0.5–1.9)

## 2023-02-04 MED ORDER — MORPHINE SULFATE (PF) 4 MG/ML IV SOLN
4.0000 mg | Freq: Once | INTRAVENOUS | Status: AC
  Administered 2023-02-04: 4 mg via INTRAVENOUS
  Filled 2023-02-04: qty 1

## 2023-02-04 MED ORDER — ACETAMINOPHEN 500 MG PO TABS
1000.0000 mg | ORAL_TABLET | Freq: Once | ORAL | Status: DC
  Filled 2023-02-04: qty 2

## 2023-02-04 MED ORDER — SODIUM CHLORIDE 0.9 % IV BOLUS
500.0000 mL | Freq: Once | INTRAVENOUS | Status: AC
  Administered 2023-02-04: 500 mL via INTRAVENOUS

## 2023-02-04 NOTE — ED Provider Notes (Signed)
[place] EMERGENCY DEPARTMENT AT [facility] Provider Note   CSN: [id number] Arrival date & time: 02/04/23  2022     History {Add pertinent medical, surgical, social history, OB history to HPI:1} Chief Complaint  Patient presents with   Abdominal Pain   Weakness    RECTOR DEVONSHIRE is a 52 y.o. male.  The history is provided by the patient and medical records. No language interpreter was used.     52 year old male significant history of diabetes with multiple diabetic complication, obesity, left BKA, CKD, hypertension who presents today with complaint of abdominal pain.  Patient reports since yesterday he has noticed progressive worsening abdominal tightness with some discomfort.  He feels like he is getting fluid in his abdomen.  Pain is mild.  He also endorsed feeling generally weak, and have had recurrent diarrhea for the past 3 days.  Patient has been on doxycycline antibiotic prescribed by dermatology for a wound on his scalp.  Denies any worsening pain to his scalp.  Home Medications Prior to Admission medications   Medication Sig Start Date End Date Taking? Authorizing Provider  acyclovir ointment (ZOVIRAX) 5 % Apply 1 Application topically every 3 (three) hours. 10/09/22   Rondel Baton, MD  amLODipine (NORVASC) 10 MG tablet TAKE ONE TABLET BY MOUTH ONCE DAILY 10/15/22   Quintella Reichert, MD  atorvastatin (LIPITOR) 20 MG tablet Take 1 tablet (20 mg total) by mouth daily. 06/14/22   Quintella Reichert, MD  Blood Pressure Monitoring (OMRON 3 SERIES BP MONITOR) DEVI 1 each by Does not apply route daily. 04/08/22   Quintella Reichert, MD  calcitRIOL (ROCALTROL) 0.25 MCG capsule Take 0.25 mcg by mouth daily. 11/07/22   [provider]  carvedilol (COREG) 25 MG tablet Take 1.5 tablets (37.5 mg total) by mouth 2 (two) times daily. 06/17/22   Gaston Islam., NP  cetirizine (ZYRTEC ALLERGY) 10 MG tablet Take 1 tablet (10 mg total) by mouth daily. 10/09/22 11/08/22   Rondel Baton, MD  doxycycline (MONODOX) 50 MG capsule Take 50 mg by mouth 2 (two) times daily. 12/20/22   [provider]  ferrous sulfate 325 (65 FE) MG EC tablet Take 325 mg by mouth daily with breakfast.    [provider]  hydrALAZINE (APRESOLINE) 100 MG tablet Take 1 tablet (100 mg total) by mouth 2 (two) times daily. 09/21/22   Gaston Islam., NP  hydrOXYzine (ATARAX) 25 MG tablet Take 1 tablet (25 mg total) by mouth at bedtime as needed. Patient not taking: Reported on 01/03/2023 02/24/22   [name], MD  ibuprofen (ADVIL) 600 MG tablet Take 1 tablet (600 mg total) by mouth every 6 (six) hours as needed. 03/11/22   Fayrene Helper, PA-C  insulin glargine (LANTUS) 100 UNIT/ML Solostar Pen Inject 10 Units into the skin at bedtime. 02/24/22 02/24/23  [name], MD  isosorbide mononitrate (IMDUR) 60 MG 24 hr tablet TAKE ONE TABLET BY MOUTH ONCE DAILY 08/19/22   Quintella Reichert, MD  MAGNESIUM-OXIDE 400 (240 Mg) MG tablet Take 1 tablet by mouth daily. 11/07/22   [provider]  Naphazoline HCl (CLEAR EYES OP) Place 1 drop into both eyes as needed (irritation).    [provider]  pregabalin (LYRICA) 50 MG capsule Take 1 capsule by mouth twice daily 01/11/23   [name], MD  Torsemide 40 MG TABS Take 80 mg (2 tablets) by mouth once a day 10/06/22   Gaston Islam.,  NP  insulin aspart protamine- aspart (NOVOLOG MIX 70/30) (70-30) 100 UNIT/ML injection Inject 0.35 mLs (35 Units total) into the skin 2 (two) times daily. 04/17/20 05/13/20  Anders Simmonds, PA-C  metFORMIN (GLUCOPHAGE-XR) 500 MG 24 hr tablet TAKE 1 TABLET (500 MG TOTAL) BY MOUTH 2 (TWO) TIMES DAILY. 12/19/20 12/22/20  Kendell Bane, MD      Allergies    Patient has no known allergies.    Review of Systems   Review of Systems  All other systems reviewed and are negative.   Physical Exam Updated Vital Signs BP (!) 116/90 (BP Location: Right Leg)   Pulse (!) 52   Temp (!) 92 F  (33.3 C) (Rectal)   Resp 16   SpO2 99%  Physical Exam Vitals and nursing note reviewed.  Constitutional:      General: He is not in acute distress.    Appearance: He is well-developed.     Comments: Chronically ill-appearing male laying in bed appears to be in no acute discomfort.  HENT:     Head: Atraumatic.     Comments: Benign appearing skin lesion noted to vertex of scalp without signs of infection. Eyes:     Conjunctiva/sclera: Conjunctivae normal.  Cardiovascular:     Rate and Rhythm: Normal rate and regular rhythm.     Pulses: Normal pulses.     Heart sounds: Normal heart sounds.  Pulmonary:     Effort: Pulmonary effort is normal.     Breath sounds: Normal breath sounds.     Comments: Diminished breath sounds without wheezes rales or rhonchi Abdominal:     General: There is distension.     Comments: Abdomen is distended but nontender to palpation.  Musculoskeletal:     Cervical back: Neck supple.  Skin:    Findings: No rash.     Comments: Right lower extremity with multiple chronic ulceration noted Left BKA  Neurological:     Mental Status: He is alert.     ED Results / Procedures / Treatments   Labs (all labs ordered are listed, but only abnormal results are displayed) Labs Reviewed  CBC WITH DIFFERENTIAL/PLATELET - Abnormal; Notable for the following components:      Result Value   RBC 3.19 (*)    Hemoglobin 8.4 (*)    HCT 28.0 (*)    RDW 23.1 (*)    Platelets 107 (*)    nRBC 0.6 (*)    Lymphs Abs 0.4 (*)    All other components within normal limits  COMPREHENSIVE METABOLIC PANEL - Abnormal; Notable for the following components:   Potassium 5.4 (*)    Chloride 113 (*)    CO2 11 (*)    Glucose, Bld 138 (*)    BUN 107 (*)    Creatinine, Ser 8.86 (*)    Calcium 7.7 (*)    Albumin 2.7 (*)    AST 11 (*)    GFR, Estimated 7 (*)    All other components within normal limits  BRAIN NATRIURETIC PEPTIDE - Abnormal; Notable for the following components:    B Natriuretic Peptide 2,740.2 (*)    All other components within normal limits  CULTURE, BLOOD (ROUTINE X 2)  CULTURE, BLOOD (ROUTINE X 2)  C DIFFICILE QUICK SCREEN W PCR REFLEX    LIPASE, BLOOD  URINALYSIS, ROUTINE W REFLEX MICROSCOPIC  I-STAT CG4 LACTIC ACID, ED  I-STAT CG4 LACTIC ACID, ED    EKG None  Radiology CT Renal Stone Study  Result  Date: 02/04/2023 CLINICAL DATA:  Left lower quadrant pain, diarrhea, and abdominal distention. EXAM: CT ABDOMEN AND PELVIS WITHOUT CONTRAST TECHNIQUE: Multidetector CT imaging of the abdomen and pelvis was performed following the standard protocol without IV contrast. RADIATION DOSE REDUCTION: This exam was performed according to the departmental dose-optimization program which includes automated exposure control, adjustment of the mA and/or kV according to patient size and/or use of iterative reconstruction technique. COMPARISON:  09/07/2014. FINDINGS: Lower chest: The heart is enlarged and there is a moderate pericardial effusion measuring up to 1.8 cm. There are small bilateral pleural effusions, greater on the right than on the left. Atelectasis is present at the lung bases. Hepatobiliary: No focal liver abnormality is seen. No gallstones, gallbladder wall thickening, or biliary dilatation. Pancreas: Unremarkable. No pancreatic ductal dilatation or surrounding inflammatory changes. Spleen: Normal in size without focal abnormality. Adrenals/Urinary Tract: The adrenal glands are within normal limits. Hyperdense material is noted in the renal collecting system in the lower pole of the left kidney. No hydroureteronephrosis. Hyperdense material is noted in the urinary bladder on the left in the region of the ureterovesicular junction. Stomach/Bowel: Stomach is within normal limits. Appendix appears normal. No evidence of bowel wall thickening, distention, or inflammatory changes. No free air or pneumatosis. Vascular/Lymphatic: Aortic atherosclerosis. No obvious  abdominal or pelvic lymphadenopathy. Examination is limited due to ascites. Reproductive: The prostate gland is enlarged. Other: Large ascites and severe anasarca. Musculoskeletal: Degenerative changes are present in the thoracolumbar spine. No acute osseous abnormality. IMPRESSION: 1. Hyperdense material in the collecting system in the lower pole of the left kidney, possible blood products versus calcification. A small amount hyperdense material is noted in the urinary bladder on the left near the UVJ, possible blood products. Correlation with urinalysis is recommended. 2. Cardiomegaly with moderate pericardial effusion. 3. Small bilateral pleural effusions with atelectasis at the lung bases. 4. Large ascites. 5. Severe anasarca. 6. Enlarged prostate gland. 7. Aortic atherosclerosis. Electronically Signed   By: Thornell Sartorius M.D.   On: 02/04/2023 23:05    Procedures Procedures  {Document cardiac monitor, telemetry assessment procedure when appropriate:1}  Medications Ordered in ED Medications  morphine (PF) 4 MG/ML injection 4 mg (has no administration in time range)  sodium chloride 0.9 % bolus 500 mL (has no administration in time range)    ED Course/ Medical Decision Making/ A&P   {   Click here for ABCD2, HEART and other calculatorsREFRESH Note before signing :1}                              Medical Decision Making Amount and/or Complexity of Data Reviewed Labs: ordered. Radiology: ordered.  Risk Prescription drug management.   BP (!) 116/90 (BP Location: Right Leg)   Pulse (!) 52   Temp (!) 92 F (33.3 C) (Rectal)   Resp 16   SpO2 99%   39:50 PM  52 year old male significant history of diabetes with multiple diabetic complication, obesity, left BKA, CKD, hypertension who presents today with complaint of abdominal pain.  Patient reports since yesterday he has noticed progressive worsening abdominal tightness with some discomfort.  He feels like he is getting fluid in his  abdomen.  Pain is mild.  He also endorsed feeling generally weak, and have had recurrent diarrhea for the past 3 days.  Patient has been on doxycycline antibiotic prescribed by dermatology for a wound on his scalp.  Denies any worsening pain to his scalp.  On exam this is a chronically ill-appearing male laying in bed appears to be in no acute discomfort.  Head exam fairly unremarkable he has likely small ulceration noted to his vertex of scalp that does not appear to be infected.  He does not have any nuchal rigidity.  Heart with normal rate and rhythm, lungs with diminished breath sounds abdomen is distended but nontender to palpation.  Right lower extremity with multiple chronic ulcerations without any significant tenderness.  Left BKA.  Skin is cool to the touch.  Vital signs notable for a rectal temp of 92.3.  Will place patient on a Lawyer.  Workup initiated.  -Labs ordered, independently viewed and interpreted by me.  Labs remarkable for BUN 107, Cr 8.86 which is worse than his baseline chronic kidney disease.  EMR reviewed, has echo showing EF of 38%.  BNP 2,740.   -The patient was maintained on a cardiac monitor.  I personally viewed and interpreted the cardiac monitored which showed an underlying rhythm of: sinus bradycardia -Imaging independently viewed and interpreted by me and I agree with radiologist's interpretation.  Result remarkable for abd/pelvis CT showing hyperdense material in the left kidney suggestive either blood product vs calcification.  Awaits UA result.  Severe anasarca and larger ascites.   -This patient presents to the ED for concern of weakness, this involves an extensive number of treatment options, and is a complaint that carries with it a high risk of complications and morbidity.  The differential diagnosis includes dehydration, c.diff, uti, electrolytes imbalance, chf -Co morbidities that complicate the patient evaluation includes CKD, DM, HTN, CHF, PAD -Treatment  includes morphine, gentle IVF, Bair Hugger -Reevaluation of the patient after these medicines showed that the patient improved -PCP office notes or outside notes reviewed -Discussion with specialist *** -Escalation to admission/observation considered:  {Document critical care time when appropriate:1} {Document review of labs and clinical decision tools ie heart score, Chads2Vasc2 etc:1}  {Document your independent review of radiology images, and any outside records:1} {Document your discussion with family members, caretakers, and with consultants:1} {Document social determinants of health affecting pt's care:1} {Document your decision making why or why not admission, treatments were needed:1} Final Clinical Impression(s) / ED Diagnoses Final diagnoses:  None    Rx / DC Orders ED Discharge Orders     None

## 2023-02-04 NOTE — ED Notes (Signed)
Patient continues to refuse to keep the bair hugger in place.

## 2023-02-04 NOTE — ED Triage Notes (Signed)
Patient presents from home due to weakness al day, diarrhea for 3 days and abdominal distention with pressure type pain. Pain is mostly in the LLQ.    Hx: Visually impaired  EMS vitals: 108/64 BP 51 HR 99% SPO2 on room air 131 CBG

## 2023-02-05 ENCOUNTER — Emergency Department (HOSPITAL_COMMUNITY): Payer: Medicare Other

## 2023-02-05 ENCOUNTER — Inpatient Hospital Stay (HOSPITAL_COMMUNITY): Payer: Medicare Other

## 2023-02-05 DIAGNOSIS — I5020 Unspecified systolic (congestive) heart failure: Secondary | ICD-10-CM | POA: Diagnosis not present

## 2023-02-05 DIAGNOSIS — E1151 Type 2 diabetes mellitus with diabetic peripheral angiopathy without gangrene: Secondary | ICD-10-CM | POA: Diagnosis present

## 2023-02-05 DIAGNOSIS — I420 Dilated cardiomyopathy: Secondary | ICD-10-CM

## 2023-02-05 DIAGNOSIS — E1122 Type 2 diabetes mellitus with diabetic chronic kidney disease: Secondary | ICD-10-CM | POA: Diagnosis present

## 2023-02-05 DIAGNOSIS — R188 Other ascites: Secondary | ICD-10-CM | POA: Diagnosis present

## 2023-02-05 DIAGNOSIS — R571 Hypovolemic shock: Secondary | ICD-10-CM | POA: Diagnosis present

## 2023-02-05 DIAGNOSIS — R197 Diarrhea, unspecified: Secondary | ICD-10-CM | POA: Diagnosis present

## 2023-02-05 DIAGNOSIS — A09 Infectious gastroenteritis and colitis, unspecified: Secondary | ICD-10-CM | POA: Diagnosis present

## 2023-02-05 DIAGNOSIS — Z89512 Acquired absence of left leg below knee: Secondary | ICD-10-CM | POA: Diagnosis not present

## 2023-02-05 DIAGNOSIS — I10 Essential (primary) hypertension: Secondary | ICD-10-CM | POA: Diagnosis not present

## 2023-02-05 DIAGNOSIS — Z992 Dependence on renal dialysis: Secondary | ICD-10-CM | POA: Diagnosis not present

## 2023-02-05 DIAGNOSIS — D631 Anemia in chronic kidney disease: Secondary | ICD-10-CM | POA: Diagnosis present

## 2023-02-05 DIAGNOSIS — I3139 Other pericardial effusion (noninflammatory): Secondary | ICD-10-CM

## 2023-02-05 DIAGNOSIS — A419 Sepsis, unspecified organism: Secondary | ICD-10-CM | POA: Diagnosis present

## 2023-02-05 DIAGNOSIS — G9340 Encephalopathy, unspecified: Secondary | ICD-10-CM | POA: Diagnosis not present

## 2023-02-05 DIAGNOSIS — I5082 Biventricular heart failure: Secondary | ICD-10-CM | POA: Diagnosis not present

## 2023-02-05 DIAGNOSIS — N179 Acute kidney failure, unspecified: Secondary | ICD-10-CM | POA: Diagnosis not present

## 2023-02-05 DIAGNOSIS — I5043 Acute on chronic combined systolic (congestive) and diastolic (congestive) heart failure: Secondary | ICD-10-CM | POA: Diagnosis present

## 2023-02-05 DIAGNOSIS — D696 Thrombocytopenia, unspecified: Secondary | ICD-10-CM | POA: Diagnosis present

## 2023-02-05 DIAGNOSIS — R6521 Severe sepsis with septic shock: Secondary | ICD-10-CM | POA: Diagnosis present

## 2023-02-05 DIAGNOSIS — G9341 Metabolic encephalopathy: Secondary | ICD-10-CM | POA: Diagnosis present

## 2023-02-05 DIAGNOSIS — E8721 Acute metabolic acidosis: Secondary | ICD-10-CM | POA: Diagnosis present

## 2023-02-05 DIAGNOSIS — R579 Shock, unspecified: Secondary | ICD-10-CM | POA: Diagnosis not present

## 2023-02-05 DIAGNOSIS — E11319 Type 2 diabetes mellitus with unspecified diabetic retinopathy without macular edema: Secondary | ICD-10-CM | POA: Diagnosis present

## 2023-02-05 DIAGNOSIS — R570 Cardiogenic shock: Secondary | ICD-10-CM | POA: Diagnosis present

## 2023-02-05 DIAGNOSIS — I132 Hypertensive heart and chronic kidney disease with heart failure and with stage 5 chronic kidney disease, or end stage renal disease: Secondary | ICD-10-CM | POA: Diagnosis present

## 2023-02-05 DIAGNOSIS — N185 Chronic kidney disease, stage 5: Secondary | ICD-10-CM | POA: Diagnosis not present

## 2023-02-05 DIAGNOSIS — F39 Unspecified mood [affective] disorder: Secondary | ICD-10-CM | POA: Diagnosis present

## 2023-02-05 DIAGNOSIS — E11649 Type 2 diabetes mellitus with hypoglycemia without coma: Secondary | ICD-10-CM | POA: Diagnosis not present

## 2023-02-05 DIAGNOSIS — E1165 Type 2 diabetes mellitus with hyperglycemia: Secondary | ICD-10-CM | POA: Diagnosis present

## 2023-02-05 DIAGNOSIS — N186 End stage renal disease: Secondary | ICD-10-CM | POA: Diagnosis present

## 2023-02-05 DIAGNOSIS — N390 Urinary tract infection, site not specified: Secondary | ICD-10-CM | POA: Diagnosis present

## 2023-02-05 LAB — GLUCOSE, CAPILLARY
Glucose-Capillary: 102 mg/dL — ABNORMAL HIGH (ref 70–99)
Glucose-Capillary: 114 mg/dL — ABNORMAL HIGH (ref 70–99)
Glucose-Capillary: 116 mg/dL — ABNORMAL HIGH (ref 70–99)
Glucose-Capillary: 121 mg/dL — ABNORMAL HIGH (ref 70–99)
Glucose-Capillary: 94 mg/dL (ref 70–99)

## 2023-02-05 LAB — RENAL FUNCTION PANEL
Albumin: 2.5 g/dL — ABNORMAL LOW (ref 3.5–5.0)
Anion gap: 13 (ref 5–15)
BUN: 90 mg/dL — ABNORMAL HIGH (ref 6–20)
CO2: 12 mmol/L — ABNORMAL LOW (ref 22–32)
Calcium: 7.5 mg/dL — ABNORMAL LOW (ref 8.9–10.3)
Chloride: 113 mmol/L — ABNORMAL HIGH (ref 98–111)
Creatinine, Ser: 7.08 mg/dL — ABNORMAL HIGH (ref 0.61–1.24)
GFR, Estimated: 9 mL/min — ABNORMAL LOW (ref >60)
Glucose, Bld: 109 mg/dL — ABNORMAL HIGH (ref 70–99)
Phosphorus: 4.6 mg/dL (ref 2.5–4.6)
Potassium: 5.3 mmol/L — ABNORMAL HIGH (ref 3.5–5.1)
Sodium: 138 mmol/L (ref 135–145)

## 2023-02-05 LAB — BLOOD GAS, ARTERIAL
Acid-base deficit: 18.8 mmol/L — ABNORMAL HIGH (ref 0.0–2.0)
Acid-base deficit: 17.6 mmol/L — ABNORMAL HIGH (ref 0.0–2.0)
Acid-base deficit: 11.5 mmol/L — ABNORMAL HIGH (ref 0.0–2.0)
Bicarbonate: 9.5 mmol/L — ABNORMAL LOW (ref 20.0–28.0)
Bicarbonate: 9.6 mmol/L — ABNORMAL LOW (ref 20.0–28.0)
Bicarbonate: 13.8 mmol/L — ABNORMAL LOW (ref 20.0–28.0)
Drawn by: 4422461
Drawn by: 422461
Drawn by: 29503
FIO2: 21 %
FIO2: 21 %
O2 Saturation: 99.1 %
O2 Saturation: 92.2 %
O2 Saturation: 99.5 %
Patient temperature: 34.5
Patient temperature: 34.7
Patient temperature: 37
pCO2 arterial: 27 mmHg — ABNORMAL LOW (ref 32–48)
pCO2 arterial: 24 mmHg — ABNORMAL LOW (ref 32–48)
pCO2 arterial: 28 mmHg — ABNORMAL LOW (ref 32–48)
pH, Arterial: 7.14 — CL (ref 7.35–7.45)
pH, Arterial: 7.19 — CL (ref 7.35–7.45)
pH, Arterial: 7.3 — ABNORMAL LOW (ref 7.35–7.45)
pO2, Arterial: 72 mmHg — ABNORMAL LOW (ref 83–108)
pO2, Arterial: 55 mmHg — ABNORMAL LOW (ref 83–108)
pO2, Arterial: 83 mmHg (ref 83–108)

## 2023-02-05 LAB — ECHOCARDIOGRAM COMPLETE
AR max vel: 3.52 cm2
AV Area VTI: 3.36 cm2
AV Area mean vel: 3.56 cm2
AV Mean grad: 3 mmHg
AV Peak grad: 6.9 mmHg
Ao pk vel: 1.31 m/s
Area-P 1/2: 2.93 cm2
Calc EF: 39.6 %
Height: 70 in
MV VTI: 3.57 cm2
S' Lateral: 3.9 cm
Single Plane A2C EF: 35.8 %
Single Plane A4C EF: 42.9 %
Weight: 3703.73 oz

## 2023-02-05 LAB — CBC
HCT: 27.4 % — ABNORMAL LOW (ref 39.0–52.0)
Hemoglobin: 8.1 g/dL — ABNORMAL LOW (ref 13.0–17.0)
MCH: 25.9 pg — ABNORMAL LOW (ref 26.0–34.0)
MCHC: 29.6 g/dL — ABNORMAL LOW (ref 30.0–36.0)
MCV: 87.5 fL (ref 80.0–100.0)
Platelets: 112 10*3/uL — ABNORMAL LOW (ref 150–400)
RBC: 3.13 MIL/uL — ABNORMAL LOW (ref 4.22–5.81)
RDW: 23.2 % — ABNORMAL HIGH (ref 11.5–15.5)
WBC: 7.4 10*3/uL (ref 4.0–10.5)
nRBC: 0.7 % — ABNORMAL HIGH (ref 0.0–0.2)

## 2023-02-05 LAB — MAGNESIUM
Magnesium: 1.7 mg/dL (ref 1.7–2.4)
Magnesium: 1.8 mg/dL (ref 1.7–2.4)

## 2023-02-05 LAB — CREATININE, SERUM
Creatinine, Ser: 8.6 mg/dL — ABNORMAL HIGH (ref 0.61–1.24)
GFR, Estimated: 7 mL/min — ABNORMAL LOW (ref >60)

## 2023-02-05 LAB — BASIC METABOLIC PANEL
Anion gap: 12 (ref 5–15)
BUN: 101 mg/dL — ABNORMAL HIGH (ref 6–20)
CO2: 11 mmol/L — ABNORMAL LOW (ref 22–32)
Calcium: 7.5 mg/dL — ABNORMAL LOW (ref 8.9–10.3)
Chloride: 114 mmol/L — ABNORMAL HIGH (ref 98–111)
Creatinine, Ser: 8.81 mg/dL — ABNORMAL HIGH (ref 0.61–1.24)
GFR, Estimated: 7 mL/min — ABNORMAL LOW (ref >60)
Glucose, Bld: 141 mg/dL — ABNORMAL HIGH (ref 70–99)
Potassium: 5.9 mmol/L — ABNORMAL HIGH (ref 3.5–5.1)
Sodium: 137 mmol/L (ref 135–145)

## 2023-02-05 LAB — CORTISOL: Cortisol, Plasma: 15.9 ug/dL

## 2023-02-05 LAB — PHOSPHORUS: Phosphorus: 6.4 mg/dL — ABNORMAL HIGH (ref 2.5–4.6)

## 2023-02-05 LAB — COOXEMETRY PANEL
Carboxyhemoglobin: 3.7 % — ABNORMAL HIGH (ref 0.5–1.5)
Methemoglobin: 0.7 % (ref 0.0–1.5)
O2 Saturation: 74 %
Total hemoglobin: 8.6 g/dL — ABNORMAL LOW (ref 12.0–16.0)

## 2023-02-05 LAB — BRAIN NATRIURETIC PEPTIDE: B Natriuretic Peptide: 2575 pg/mL — ABNORMAL HIGH (ref 0.0–100.0)

## 2023-02-05 LAB — HIV ANTIBODY (ROUTINE TESTING W REFLEX): HIV Screen 4th Generation wRfx: NONREACTIVE

## 2023-02-05 LAB — PROCALCITONIN: Procalcitonin: 0.19 ng/mL

## 2023-02-05 LAB — MRSA NEXT GEN BY PCR, NASAL: MRSA by PCR Next Gen: DETECTED — AB

## 2023-02-05 LAB — LACTIC ACID, PLASMA: Lactic Acid, Venous: 0.5 mmol/L (ref 0.5–1.9)

## 2023-02-05 MED ORDER — NOREPINEPHRINE 4 MG/250ML-% IV SOLN
2.0000 ug/min | INTRAVENOUS | Status: DC
  Administered 2023-02-05: 6 ug/min via INTRAVENOUS

## 2023-02-05 MED ORDER — HEPARIN SODIUM (PORCINE) 5000 UNIT/ML IJ SOLN
5000.0000 [IU] | Freq: Three times a day (TID) | INTRAMUSCULAR | Status: DC
  Administered 2023-02-05 – 2023-02-14 (×27): 5000 [IU] via SUBCUTANEOUS
  Filled 2023-02-05 (×29): qty 1

## 2023-02-05 MED ORDER — NEPRO/CARBSTEADY PO LIQD
237.0000 mL | Freq: Three times a day (TID) | ORAL | Status: DC
  Administered 2023-02-05 – 2023-02-14 (×16): 237 mL via ORAL
  Filled 2023-02-05 (×5): qty 237

## 2023-02-05 MED ORDER — SODIUM ZIRCONIUM CYCLOSILICATE 10 G PO PACK
10.0000 g | PACK | Freq: Once | ORAL | Status: AC
  Administered 2023-02-05: 10 g via ORAL
  Filled 2023-02-05: qty 1

## 2023-02-05 MED ORDER — FUROSEMIDE 10 MG/ML IJ SOLN
160.0000 mg | Freq: Once | INTRAVENOUS | Status: AC
  Administered 2023-02-05: 160 mg via INTRAVENOUS
  Filled 2023-02-05: qty 16

## 2023-02-05 MED ORDER — INSULIN ASPART 100 UNIT/ML IJ SOLN
0.0000 [IU] | INTRAMUSCULAR | Status: DC
  Filled 2023-02-05: qty 0.06

## 2023-02-05 MED ORDER — PANTOPRAZOLE SODIUM 40 MG PO TBEC: 40.0000 mg | DELAYED_RELEASE_TABLET | Freq: Every day | ORAL | Status: DC

## 2023-02-05 MED ORDER — HEPARIN SODIUM (PORCINE) 1000 UNIT/ML DIALYSIS
1000.0000 [IU] | INTRAMUSCULAR | Status: DC | PRN
  Administered 2023-02-05: 2400 [IU] via INTRAVENOUS_CENTRAL
  Administered 2023-02-06: 1000 [IU] via INTRAVENOUS_CENTRAL
  Administered 2023-02-09: 4000 [IU] via INTRAVENOUS_CENTRAL
  Filled 2023-02-05 (×2): qty 6
  Filled 2023-02-05: qty 5
  Filled 2023-02-05 (×2): qty 6

## 2023-02-05 MED ORDER — SODIUM BICARBONATE 8.4 % IV SOLN
50.0000 meq | Freq: Once | INTRAVENOUS | Status: AC
  Administered 2023-02-05: 50 meq via INTRAVENOUS
  Filled 2023-02-05: qty 50

## 2023-02-05 MED ORDER — PRISMASOL BGK 4/2.5 32-4-2.5 MEQ/L REPLACEMENT SOLN
Status: DC
  Filled 2023-02-05 (×3): qty 5000

## 2023-02-05 MED ORDER — RENA-VITE PO TABS
1.0000 | ORAL_TABLET | Freq: Every day | ORAL | Status: DC
  Administered 2023-02-05 – 2023-02-14 (×9): 1 via ORAL
  Filled 2023-02-05 (×10): qty 1

## 2023-02-05 MED ORDER — SODIUM CHLORIDE 0.9 % FOR CRRT: INTRAVENOUS_CENTRAL | Status: DC | PRN

## 2023-02-05 MED ORDER — PRISMASOL BGK 4/2.5 32-4-2.5 MEQ/L EC SOLN
Status: DC
  Filled 2023-02-05 (×11): qty 5000

## 2023-02-05 MED ORDER — MUPIROCIN 2 % EX OINT
1.0000 | TOPICAL_OINTMENT | Freq: Two times a day (BID) | CUTANEOUS | Status: DC
  Administered 2023-02-05 – 2023-02-06 (×2): 1 via NASAL
  Filled 2023-02-05: qty 22

## 2023-02-05 MED ORDER — SODIUM CHLORIDE 0.9 % IV SOLN
250.0000 mL | INTRAVENOUS | Status: DC
  Administered 2023-02-06: 250 mL via INTRAVENOUS

## 2023-02-05 MED ORDER — HYDROXYZINE HCL 25 MG PO TABS
25.0000 mg | ORAL_TABLET | Freq: Three times a day (TID) | ORAL | Status: DC | PRN
  Administered 2023-02-05 – 2023-02-12 (×3): 25 mg via ORAL
  Filled 2023-02-05 (×3): qty 1

## 2023-02-05 MED ORDER — FAMOTIDINE IN NACL 20-0.9 MG/50ML-% IV SOLN
20.0000 mg | INTRAVENOUS | Status: DC
  Administered 2023-02-05 – 2023-02-07 (×3): 20 mg via INTRAVENOUS
  Filled 2023-02-05 (×3): qty 50

## 2023-02-05 MED ORDER — NOREPINEPHRINE 4 MG/250ML-% IV SOLN
0.0000 ug/min | INTRAVENOUS | Status: DC
  Administered 2023-02-05: 2 ug/min via INTRAVENOUS
  Filled 2023-02-05: qty 250

## 2023-02-05 MED ORDER — ORAL CARE MOUTH RINSE: 15.0000 mL | OROMUCOSAL | Status: DC | PRN

## 2023-02-05 MED ORDER — SODIUM BICARBONATE 8.4 % IV SOLN
INTRAVENOUS | Status: DC
  Filled 2023-02-05: qty 150
  Filled 2023-02-05: qty 1000

## 2023-02-05 MED ORDER — CHLORHEXIDINE GLUCONATE CLOTH 2 % EX PADS
6.0000 | MEDICATED_PAD | Freq: Every day | CUTANEOUS | Status: DC
  Administered 2023-02-05: 6 via TOPICAL

## 2023-02-05 MED ORDER — SODIUM CHLORIDE 0.9 % IV BOLUS
500.0000 mL | Freq: Once | INTRAVENOUS | Status: AC
  Administered 2023-02-05: 500 mL via INTRAVENOUS

## 2023-02-05 MED ORDER — SODIUM CHLORIDE 0.9 % IV SOLN
2.0000 g | INTRAVENOUS | Status: DC
  Administered 2023-02-06 – 2023-02-07 (×2): 2 g via INTRAVENOUS
  Filled 2023-02-05 (×2): qty 20

## 2023-02-05 MED ORDER — SODIUM CHLORIDE 0.9 % IV SOLN
2.0000 g | Freq: Once | INTRAVENOUS | Status: AC
  Administered 2023-02-05: 2 g via INTRAVENOUS
  Filled 2023-02-05: qty 20

## 2023-02-05 MED ORDER — POLYETHYLENE GLYCOL 3350 17 G PO PACK: 17.0000 g | PACK | Freq: Every day | ORAL | Status: DC | PRN

## 2023-02-05 MED ORDER — DOCUSATE SODIUM 100 MG PO CAPS: 100.0000 mg | ORAL_CAPSULE | Freq: Two times a day (BID) | ORAL | Status: DC | PRN

## 2023-02-05 MED ORDER — HYDROXYZINE HCL 10 MG PO TABS: 10.0000 mg | ORAL_TABLET | Freq: Three times a day (TID) | ORAL | Status: DC | PRN

## 2023-02-05 NOTE — Consult Note (Signed)
[facility]  INPATIENT CONSULTATION  Reason for Consultation: AKI on CKD Requesting Provider: Dr. Eudelia Bunch  HPI: Todd Mccoy is an 52 y.o. male with DM, HFrEF, h/o L BKA, HTN, blindness, CKD 4 who is currently being admitted for shock and nephrology is consulted for evaluation and management of AKI on CKD with metabolic abnormalities.   Pt presented to [facility] this evening with weakness on background of a few days history of watery diarrhea 6-7 episodes in the past 24h.  Was taking doxycycline in past few days for a scalp wound.  Also having abdominal fullness, inability to urinate x 24h.  Found to have K 5.4, Bicarb 11, BUN 107, Cr 8.9, Ca 7.7, alb 2.7, normal LFTs, WBC 7 Hb 8.4 Plt 107.  UA +LE, Blood, protein, many bacteria, WBC clumps. CT abd wo contrast showing mod pericardial effusion, pleural effusions, ascites, anasarca; no hydronephrosis or GI issues.  Pt became hypotensive and required initiation of NE gtt.  CXR showing pulm edema but pt with sats 100% on RA during exam.   ABG 3am 7.14 / 27 / 72 on RA.   Denies dysgeusia, confusion, hiccups; endorses pruritus.   Followed by Dr. Sheryn Bison -- baseline Cr in the 4s, GFR mid teens as of early 2024.  Missed follow up after 08/2022 appt.  10/2022 labs showing Cr 5.35, eGFR 12.   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/2020   pt states recent MD visit /left eye worse than right   Dyslipidemia 12/10/2020   Hx of BKA, left (HCC)    Hypertension     Morbid obesity (HCC)    Osteomyelitis (HCC)    Pneumonia    PSH: Past Surgical History:  Procedure Laterality Date   ABSCESS DRAINAGE     neck   AMPUTATION Left 11/14/2020   Procedure: LEFT 5TH RAY AMPUTATION;  Surgeon: Nadara Mustard, MD;  Location: [facility] OR;  Service: Orthopedics;  Laterality: Left;   AMPUTATION Left 12/10/2020   Procedure: LEFT BELOW KNEE AMPUTATION;  Surgeon: Nadara Mustard, MD;  Location: [facility] OR;  Service: Orthopedics;  Laterality: Left;   AMPUTATION TOE Right 03/25/2020   Procedure: AMPUTATION TOE;  Surgeon: Park Liter, DPM;  Location: [facility] ORS;  Service: Podiatry;  Laterality: Right;   INCISION AND DRAINAGE ABSCESS Right 09/07/2014   Procedure: INCISION AND DRAINAGE ABSCESS RIGHT FLANK;  Surgeon: Avel Peace, MD;  Location: [facility] ORS;  Service: General;  Laterality: Right;   LEG AMPUTATION BELOW KNEE Left 12/10/2020   SCROTUM EXPLORATION       Past Medical History:  Diagnosis Date   Anemia    Anemia    Anxiety    Chronic combined systolic (congestive) and diastolic (congestive) heart failure (HCC)    EF 40-45% with G3DD on echo 2022   CKD (chronic kidney disease), stage IV (HCC) 12/10/2020   Diabetes mellitus with complication (HCC)    Diabetic neuropathy (HCC)  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  (Not in a hospital admission)   ALLERGIES:  No Known Allergies  FAM HX: Family History  Problem Relation Age of Onset   Diabetes Mother    Diabetes Father    Diabetes Brother    Colon cancer Neg Hx    Esophageal cancer Neg Hx    Pancreatic cancer  Neg Hx    Stomach cancer Neg Hx    Liver disease Neg Hx    CAD Neg Hx     Social History:   reports that he has been smoking cigarettes. He started smoking about 33 years ago. He has a 16 pack-year smoking history. He has been exposed to tobacco smoke. He has quit using smokeless tobacco. He reports that he does not drink alcohol and does not use drugs.  ROS: 12 system ROS neg except for HPI   Blood pressure (!) 85/56, pulse (!) 49, temperature (!) 91 F (32.8 C), temperature source Oral, resp. rate 13, SpO2 97%. PHYSICAL EXAM: Gen: obese chronically ill appearing man who is in no distress  Eyes:  blind ENT: MM tacky Neck: thick CV: brady and regular, distant heart sounds, no obvious rub Abd:  nontender but distended Lungs: normal WOB on RA, dec in bases, rhonchi anteriorly GU: foley catheter has just been placed, no UOP yet. Extr: L BKA with shrinker on, R 2+ chronic thick edema, pitting, wrap over reported pressure ulcer? Neuro:  AOx3, conversant   Results for orders placed or performed during the hospital encounter of 02/04/23 (from the past 48 hour(s))  CBC with Differential     Status: Abnormal   Collection Time: 02/04/23  9:07 PM  Result Value Ref Range   WBC 7.0 4.0 - 10.5 K/uL   RBC 3.19 (L) 4.22 - 5.81 MIL/uL   Hemoglobin 8.4 (L) 13.0 - 17.0 g/dL   HCT 84.6 (L) 96.2 - 95.2 %   MCV 87.8 80.0 - 100.0 fL   MCH 26.3 26.0 - 34.0 pg   MCHC 30.0 30.0 - 36.0 g/dL   RDW 84.1 (H) 32.4 - 40.1 %   Platelets 107 (L) 150 - 400 K/uL   nRBC 0.6 (H) 0.0 - 0.2 %   Neutrophils Relative % 88 %   Neutro Abs 6.2 1.7 - 7.7 K/uL   Lymphocytes Relative 5 %   Lymphs Abs 0.4 (L) 0.7 - 4.0 K/uL   Monocytes Relative 6 %   Monocytes Absolute 0.4 0.1 - 1.0 K/uL   Eosinophils Relative 0 %   Eosinophils Absolute 0.0 0.0 - 0.5 K/uL   Basophils Relative 0 %   Basophils Absolute 0.0 0.0 - 0.1 K/uL   Immature Granulocytes 1 %   Abs Immature Granulocytes 0.04 0.00 - 0.07 K/uL   Acanthocytes  PRESENT    Tear Drop Cells PRESENT    Burr Cells PRESENT    Ovalocytes PRESENT     Comment: Performed at [facility], [address], [place], [name] [address]  Comprehensive metabolic panel  Status: Abnormal   Collection Time: 02/04/23  9:07 PM  Result Value Ref Range   Sodium 137 135 - 145 mmol/L   Potassium 5.4 (H) 3.5 - 5.1 mmol/L   Chloride 113 (H) 98 - 111 mmol/L   CO2 11 (L) 22 - 32 mmol/L   Glucose, Bld 138 (H) 70 - 99 mg/dL    Comment: Glucose reference range applies only to samples taken after fasting for at least 8 hours.   BUN 107 (H) 6 - 20 mg/dL    Comment: RESULT CONFIRMED BY MANUAL DILUTION   Creatinine, Ser 8.86 (H) 0.61 - 1.24 mg/dL   Calcium 7.7 (L) 8.9 - 10.3 mg/dL   Total Protein 7.1 6.5 - 8.1 g/dL   Albumin 2.7 (L) 3.5 - 5.0 g/dL   AST 11 (L) 15 - 41 U/L   ALT 12 0 - 44 U/L   Alkaline Phosphatase 123 38 - 126 U/L   Total Bilirubin 0.6 0.3 - 1.2 mg/dL   GFR, Estimated 7 (L) >60 mL/min    Comment: (NOTE) Calculated using the CKD-EPI Creatinine Equation (2021)    Anion gap 13 5 - 15    Comment: Performed at [facility], [address], [place], [name] [address]  Lipase, blood     Status: None   Collection Time: 02/04/23  9:07 PM  Result Value Ref Range   Lipase 46 11 - 51 U/L    Comment: Performed at [facility], [address] [place], [name] [address]  Brain natriuretic peptide     Status: Abnormal   Collection Time: 02/04/23  9:07 PM  Result Value Ref Range   B Natriuretic Peptide 2,740.2 (H) 0.0 - 100.0 pg/mL    Comment: Performed at [facility], [address] [place], [name] [address]  I-Stat CG4 Lactic Acid     Status: None   Collection Time: 02/04/23  9:19 PM  Result Value Ref Range   Lactic Acid, Venous 0.6 0.5 - 1.9 mmol/L  Urinalysis, Routine w reflex microscopic -Urine, Clean Catch     Status: Abnormal   Collection Time: 02/05/23 12:08 AM  Result Value  Ref Range   Color, Urine YELLOW YELLOW   APPearance TURBID (A) CLEAR   Specific Gravity, Urine 1.015 1.005 - 1.030   pH 5.0 5.0 - 8.0   Glucose, UA NEGATIVE NEGATIVE mg/dL   Hgb urine dipstick LARGE (A) NEGATIVE   Bilirubin Urine NEGATIVE NEGATIVE   Ketones, ur NEGATIVE NEGATIVE mg/dL   Protein, ur 841 (A) NEGATIVE mg/dL   Nitrite NEGATIVE NEGATIVE   Leukocytes,Ua MODERATE (A) NEGATIVE   RBC / HPF >50 0 - 5 RBC/hpf   WBC, UA >50 0 - 5 WBC/hpf   Bacteria, UA MANY (A) NONE SEEN   Squamous Epithelial / HPF 0-5 0 - 5 /HPF   WBC Clumps PRESENT     Comment: Performed at [facility], [address], [place], [name] [address]  Blood gas, arterial (at [facility])     Status: Abnormal   Collection Time: 02/05/23  3:06 AM  Result Value Ref Range   FIO2 21.00 %   pH, Arterial 7.14 (LL) 7.35 - 7.45    Comment: RESULT CALLED TO, READ BACK BY AND VERIFIED WITH: Gwenette Greet RN @ [phone] 02/05/23 MCLEAN K.    pCO2 arterial 27 (L) 32 - 48 mmHg   pO2, Arterial 72 (L) 83 - 108 mmHg   Bicarbonate 9.5 (L) 20.0 -  28.0 mmol/L   Acid-base deficit 18.8 (H) 0.0 - 2.0 mmol/L   O2 Saturation 99.1 %   Patient temperature 34.5    Collection site LEFT RADIAL    Drawn by [id number]    Allens test (pass/fail) PASS PASS    Comment: Performed at [facility], [address], [place], [name] [address]    DG Chest Portable 1 View  Result Date: 02/05/2023 CLINICAL DATA:  Weakness, shortness of breath EXAM: PORTABLE CHEST 1 VIEW COMPARISON:  02/21/2022 FINDINGS: Cardiomegaly, vascular congestion. Very low lung volumes. Diffuse bilateral airspace disease, right greater than left. No visible effusions or pneumothorax. No acute bony abnormality. IMPRESSION: Cardiomegaly with diffuse bilateral airspace disease, right greater than left. Favor edema although infection cannot be excluded. Electronically Signed   By: Charlett Nose M.D.   On: 02/05/2023 01:05   CT Renal Stone Study  Result Date:  02/04/2023 CLINICAL DATA:  Left lower quadrant pain, diarrhea, and abdominal distention. EXAM: CT ABDOMEN AND PELVIS WITHOUT CONTRAST TECHNIQUE: Multidetector CT imaging of the abdomen and pelvis was performed following the standard protocol without IV contrast. RADIATION DOSE REDUCTION: This exam was performed according to the departmental dose-optimization program which includes automated exposure control, adjustment of the mA and/or kV according to patient size and/or use of iterative reconstruction technique. COMPARISON:  09/07/2014. FINDINGS: Lower chest: The heart is enlarged and there is a moderate pericardial effusion measuring up to 1.8 cm. There are small bilateral pleural effusions, greater on the right than on the left. Atelectasis is present at the lung bases. Hepatobiliary: No focal liver abnormality is seen. No gallstones, gallbladder wall thickening, or biliary dilatation. Pancreas: Unremarkable. No pancreatic ductal dilatation or surrounding inflammatory changes. Spleen: Normal in size without focal abnormality. Adrenals/Urinary Tract: The adrenal glands are within normal limits. Hyperdense material is noted in the renal collecting system in the lower pole of the left kidney. No hydroureteronephrosis. Hyperdense material is noted in the urinary bladder on the left in the region of the ureterovesicular junction. Stomach/Bowel: Stomach is within normal limits. Appendix appears normal. No evidence of bowel wall thickening, distention, or inflammatory changes. No free air or pneumatosis. Vascular/Lymphatic: Aortic atherosclerosis. No obvious abdominal or pelvic lymphadenopathy. Examination is limited due to ascites. Reproductive: The prostate gland is enlarged. Other: Large ascites and severe anasarca. Musculoskeletal: Degenerative changes are present in the thoracolumbar spine. No acute osseous abnormality. IMPRESSION: 1. Hyperdense material in the collecting system in the lower pole of the left kidney,  possible blood products versus calcification. A small amount hyperdense material is noted in the urinary bladder on the left near the UVJ, possible blood products. Correlation with urinalysis is recommended. 2. Cardiomegaly with moderate pericardial effusion. 3. Small bilateral pleural effusions with atelectasis at the lung bases. 4. Large ascites. 5. Severe anasarca. 6. Enlarged prostate gland. 7. Aortic atherosclerosis. Electronically Signed   By: Thornell Sartorius M.D.   On: 02/04/2023 23:05    Assessment/Plan **Shock:  hypothermic but no leukocytosis - multiple sources of possible infection - recommend treating with broad spectrum antibiotics for sepsis for now.  Bedside echo concerning for poss cardiogenic component - checking co-ox and may need inotropes.  Per PCCM.   **AKI on advanced CKD:  reports anuric x 24h.  Suspect he's had progression of underlying CKD based in 10/2022 labs and now with no frank uremic symptoms but multiple metabolic abnormalities and evolving volume overload.  D/w PCCM -- medically managing in the ED but rec placement of HD catheter  and will plan for CRRT once in ICU.  Discussed with patient may be ESRD now but will see how things go over the next few days.    **HFrEF: 01/2022 EF 30%.  As above assessing for need of inotropes.   Meds limited by bradycardia and GFR.   **Hyperkalemia: mild;  lokelma ordered, will use 4K dialysate  **Volume:  volume overloaded but on RA, gentle fluid removal with CRRT  **metabolic acidosis:  secondary to AKI/CKD, 1 amp bicarb ordered in the ED.  No bicarb gtt in setting of volume overload; can repeat pushes PRN until CRRT initiated.   **Anemia:  suspect of CKD, no reported bleeding.  Check iron indices.  Likely will need to start ESA.    **Thrombocytopenia:  plt 107, new finding.  Hold heparin with CRRT.   Will follow, reach out with concerns.   Tyler Pita 02/05/2023, 3:47 AM

## 2023-02-05 NOTE — ED Notes (Signed)
Going to try to admit to icu

## 2023-02-05 NOTE — ED Provider Notes (Signed)
Attestation:   I provided a substantive portion of the care of this patient. I personally provided more than half of the total time dedicated to treatment of this patient. I personally made/approved the management plan for this patient and take responsibility for the patient management.    Briefly, the patient is a 52 y.o. male with medical history including diabetes, morbid obesity, hypertension, CKD, presenting with weakness. Seen by evening team. Signed out pending evaluation for admission AKI 2/2 presumed dehydration from GI losses.  On our evaluation patient was borderline hypotensive. EKG with sinus brady and low amplitude. Mild JVD.  POCUS with moderate to large pericardial effusion w/o tamponade features. EF approx 30-50% by estimation. IVC non-collapsible.  UA concerning for infection. Started on ABX. Normal lactic.  Nephrology and ICU team consulted for cooperative management.    Vitals:   02/05/23 0245 02/05/23 0300  BP: (!) 88/61 (!) 85/56  Pulse: (!) 49 (!) 49  Resp: 12 13  Temp:    SpO2: 97% 97%     EKG Interpretation Date/Time:  Saturday February 05 2023 00:53:58 EDT Ventricular Rate:  49 PR Interval:  187 QRS Duration:  122 QT Interval:  540 QTC Calculation: 488 R Axis:   134  Text Interpretation: Sinus bradycardia Nonspecific intraventricular conduction delay Borderline T abnormalities, inferior leads Confirmed by Drema Pry [phone]) on 02/05/2023 1:11:53 AM         Ultrasound ED Echo  Date/Time: 02/05/2023 7:42 PM  Performed by: Nira Conn, MD Authorized by: Nira Conn, MD   Procedure details:    Indications: hypotension     Views: parasternal long axis view, parasternal short axis view and apical 4 chamber view     Images: archived     Limitations:  Positioning Findings:    Pericardium comment:  Mod-large pericardial effusion   LV Function: depressed (30 - 50%)     RV Diameter: dilated     RV Diameter comment:  Mild -  moderate   IVC: dilated   Impression:    Impression: pericardial effusion present and decreased contractility     Impression comment:  No tamponade features  .       Nira Conn, MD 02/05/23 [phone]

## 2023-02-05 NOTE — H&P (Incomplete)
History and Physical    NALIN MAZZOCCO ZOX:[id number] DOB: Aug 22, 1970 DOA: 02/04/2023  PCP: [name], MD   Patient coming from: Home   Chief Complaint:  Chief Complaint  Patient presents with   Abdominal Pain   Weakness    HPI:  Todd Mccoy is a 52 y.o. male with medical history significant of diabetes mellitus type 2, diabetic retinopathy, CKD stage IV, essential hypertension, left BKA secondary to osteomyelitis, HFrEF 38 to 40%, chronic smoker, hyperlipidemia and amyloidosis,   ED Course: ***  Review of Systems:  ROS  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/2020   pt states recent MD visit /left eye worse than right   Dyslipidemia 12/10/2020   Hx of BKA, left (HCC)    Hypertension    Morbid obesity (HCC)    Osteomyelitis (HCC)    Pneumonia     Past Surgical History:  Procedure Laterality Date   ABSCESS DRAINAGE     neck   AMPUTATION Left 11/14/2020   Procedure: LEFT 5TH RAY AMPUTATION;  Surgeon: Nadara Mustard, MD;  Location: [facility] OR;  Service: Orthopedics;  Laterality: Left;   AMPUTATION Left 12/10/2020   Procedure: LEFT BELOW KNEE AMPUTATION;  Surgeon: Nadara Mustard, MD;  Location: [facility] OR;  Service: Orthopedics;  Laterality: Left;   AMPUTATION TOE Right 03/25/2020   Procedure: AMPUTATION TOE;  Surgeon: Park Liter, DPM;  Location: [facility] ORS;  Service: Podiatry;  Laterality: Right;   INCISION AND DRAINAGE ABSCESS Right 09/07/2014   Procedure: INCISION AND DRAINAGE ABSCESS RIGHT  FLANK;  Surgeon: Avel Peace, MD;  Location: [facility] ORS;  Service: General;  Laterality: Right;   LEG AMPUTATION BELOW KNEE Left 12/10/2020   SCROTUM EXPLORATION       reports that he has been smoking cigarettes. He started smoking about 33 years ago. He has a 16 pack-year smoking history. He has been exposed to tobacco smoke. He has quit using smokeless tobacco. He reports that he does not drink alcohol and does not use drugs.  No Known Allergies  Family History  Problem Relation Age of Onset   Diabetes Mother    Diabetes Father    Diabetes Brother    Colon cancer Neg Hx    Esophageal cancer Neg Hx    Pancreatic cancer Neg Hx    Stomach cancer Neg Hx    Liver disease Neg Hx    CAD Neg Hx     Prior to Admission medications   Medication Sig Start Date End Date Taking? Authorizing Provider  acyclovir ointment (ZOVIRAX) 5 % Apply 1 Application topically every 3 (three) hours. 10/09/22   Rondel Baton, MD  amLODipine (NORVASC) 10 MG tablet TAKE ONE TABLET BY MOUTH ONCE DAILY 10/15/22   Quintella Reichert, MD  atorvastatin (LIPITOR) 20 MG tablet Take 1 tablet (20 mg total) by mouth daily. 06/14/22   Quintella Reichert, MD  Blood Pressure Monitoring (OMRON 3 SERIES BP MONITOR)  DEVI 1 each by Does not apply route daily. 04/08/22   Quintella Reichert, MD  calcitRIOL (ROCALTROL) 0.25 MCG capsule Take 0.25 mcg by mouth daily. 11/07/22   [provider]  carvedilol (COREG) 25 MG tablet Take 1.5 tablets (37.5 mg total) by mouth 2 (two) times daily. 06/17/22   Gaston Islam., NP  cetirizine (ZYRTEC ALLERGY) 10 MG tablet Take 1 tablet (10 mg total) by mouth daily. 10/09/22 11/08/22  Rondel Baton, MD  doxycycline (MONODOX) 50 MG capsule Take 50 mg by mouth 2 (two) times daily. 12/20/22   [provider]  ferrous sulfate 325 (65 FE) MG EC tablet Take 325 mg by mouth daily with breakfast.    [provider]  hydrALAZINE (APRESOLINE) 100 MG tablet Take 1 tablet (100 mg total) by  mouth 2 (two) times daily. 09/21/22   Gaston Islam., NP  hydrOXYzine (ATARAX) 25 MG tablet Take 1 tablet (25 mg total) by mouth at bedtime as needed. Patient not taking: Reported on 01/03/2023 02/24/22   [name], MD  ibuprofen (ADVIL) 600 MG tablet Take 1 tablet (600 mg total) by mouth every 6 (six) hours as needed. 03/11/22   Fayrene Helper, PA-C  insulin glargine (LANTUS) 100 UNIT/ML Solostar Pen Inject 10 Units into the skin at bedtime. 02/24/22 02/24/23  [name], MD  isosorbide mononitrate (IMDUR) 60 MG 24 hr tablet TAKE ONE TABLET BY MOUTH ONCE DAILY 08/19/22   Quintella Reichert, MD  MAGNESIUM-OXIDE 400 (240 Mg) MG tablet Take 1 tablet by mouth daily. 11/07/22   [provider]  Naphazoline HCl (CLEAR EYES OP) Place 1 drop into both eyes as needed (irritation).    [provider]  pregabalin (LYRICA) 50 MG capsule Take 1 capsule by mouth twice daily 01/11/23   [name], MD  Torsemide 40 MG TABS Take 80 mg (2 tablets) by mouth once a day 10/06/22   Gaston Islam., NP  insulin aspart protamine- aspart (NOVOLOG MIX 70/30) (70-30) 100 UNIT/ML injection Inject 0.35 mLs (35 Units total) into the skin 2 (two) times daily. 04/17/20 05/13/20  Anders Simmonds, PA-C  metFORMIN (GLUCOPHAGE-XR) 500 MG 24 hr tablet TAKE 1 TABLET (500 MG TOTAL) BY MOUTH 2 (TWO) TIMES DAILY. 12/19/20 12/22/20  Kendell Bane, MD     Physical Exam: Vitals:   02/04/23 2038 02/04/23 2049 02/04/23 2050 02/04/23 2200  BP: (!) 116/90  (!) 116/90 98/75  Pulse: (!) 53 (!) 52 (!) 52 (!) 49  Resp: 15 14 16  (!) 25  Temp:   (!) 92 F (33.3 C)   TempSrc:   Rectal   SpO2: 99% 98% 99% 100%    Physical Exam   Labs on Admission: I have personally reviewed following labs and imaging studies  CBC: Recent Labs  Lab 02/04/23 2107  WBC 7.0  NEUTROABS 6.2  HGB 8.4*  HCT 28.0*  MCV 87.8  PLT 107*   Basic Metabolic Panel: Recent Labs  Lab 02/04/23 2107  NA 137  K 5.4*  CL 113*  CO2 11*   GLUCOSE 138*  BUN 107*  CREATININE 8.86*  CALCIUM 7.7*   GFR: CrCl cannot be calculated (Unknown ideal weight.). Liver Function Tests: Recent Labs  Lab 02/04/23 2107  AST 11*  ALT 12  ALKPHOS 123  BILITOT 0.6  PROT 7.1  ALBUMIN 2.7*   Recent Labs  Lab 02/04/23 2107  LIPASE 46   No results for input(s): "AMMONIA" in the last 168  hours. Coagulation Profile: No results for input(s): "INR", "PROTIME" in the last 168 hours. Cardiac Enzymes: No results for input(s): "CKTOTAL", "CKMB", "CKMBINDEX", "TROPONINI", "TROPONINIHS" in the last 168 hours. BNP (last 3 results) Recent Labs    02/21/22 1405 06/21/22 1223 02/04/23 2107  BNP 4,003.4* >4,500.0* 2,740.2*   HbA1C: No results for input(s): "HGBA1C" in the last 72 hours. CBG: No results for input(s): "GLUCAP" in the last 168 hours. Lipid Profile: No results for input(s): "CHOL", "HDL", "LDLCALC", "TRIG", "CHOLHDL", "LDLDIRECT" in the last 72 hours. Thyroid Function Tests: No results for input(s): "TSH", "T4TOTAL", "FREET4", "T3FREE", "THYROIDAB" in the last 72 hours. Anemia Panel: No results for input(s): "VITAMINB12", "FOLATE", "FERRITIN", "TIBC", "IRON", "RETICCTPCT" in the last 72 hours. Urine analysis:    Component Value Date/Time   COLORURINE AMBER (A) 02/27/2021 0813   APPEARANCEUR HAZY (A) 02/27/2021 0813   LABSPEC 1.023 02/27/2021 0813   PHURINE 5.0 02/27/2021 0813   GLUCOSEU 50 (A) 02/27/2021 0813   HGBUR MODERATE (A) 02/27/2021 0813   BILIRUBINUR NEGATIVE 02/27/2021 0813   KETONESUR NEGATIVE 02/27/2021 0813   PROTEINUR >=300 (A) 02/27/2021 0813   UROBILINOGEN 1.0 09/06/2014 2307   NITRITE NEGATIVE 02/27/2021 0813   LEUKOCYTESUR NEGATIVE 02/27/2021 0813    Radiological Exams on Admission: I have personally reviewed images CT Renal Stone Study  Result Date: 02/04/2023 CLINICAL DATA:  Left lower quadrant pain, diarrhea, and abdominal distention. EXAM: CT ABDOMEN AND PELVIS WITHOUT CONTRAST TECHNIQUE:  Multidetector CT imaging of the abdomen and pelvis was performed following the standard protocol without IV contrast. RADIATION DOSE REDUCTION: This exam was performed according to the departmental dose-optimization program which includes automated exposure control, adjustment of the mA and/or kV according to patient size and/or use of iterative reconstruction technique. COMPARISON:  09/07/2014. FINDINGS: Lower chest: The heart is enlarged and there is a moderate pericardial effusion measuring up to 1.8 cm. There are small bilateral pleural effusions, greater on the right than on the left. Atelectasis is present at the lung bases. Hepatobiliary: No focal liver abnormality is seen. No gallstones, gallbladder wall thickening, or biliary dilatation. Pancreas: Unremarkable. No pancreatic ductal dilatation or surrounding inflammatory changes. Spleen: Normal in size without focal abnormality. Adrenals/Urinary Tract: The adrenal glands are within normal limits. Hyperdense material is noted in the renal collecting system in the lower pole of the left kidney. No hydroureteronephrosis. Hyperdense material is noted in the urinary bladder on the left in the region of the ureterovesicular junction. Stomach/Bowel: Stomach is within normal limits. Appendix appears normal. No evidence of bowel wall thickening, distention, or inflammatory changes. No free air or pneumatosis. Vascular/Lymphatic: Aortic atherosclerosis. No obvious abdominal or pelvic lymphadenopathy. Examination is limited due to ascites. Reproductive: The prostate gland is enlarged. Other: Large ascites and severe anasarca. Musculoskeletal: Degenerative changes are present in the thoracolumbar spine. No acute osseous abnormality. IMPRESSION: 1. Hyperdense material in the collecting system in the lower pole of the left kidney, possible blood products versus calcification. A small amount hyperdense material is noted in the urinary bladder on the left near the UVJ,  possible blood products. Correlation with urinalysis is recommended. 2. Cardiomegaly with moderate pericardial effusion. 3. Small bilateral pleural effusions with atelectasis at the lung bases. 4. Large ascites. 5. Severe anasarca. 6. Enlarged prostate gland. 7. Aortic atherosclerosis. Electronically Signed   By: Thornell Sartorius M.D.   On: 02/04/2023 23:05    EKG: My personal interpretation of EKG shows: ***    Assessment/Plan: Active Problems:   * No active hospital  problems. *    Assessment and Plan: No notes have been filed under this hospital service. Service: Hospitalist      DVT prophylaxis:  {Blank single:19197::"Lovenox","SQ Heparin","IV heparin gtts","Xarelto","Eliquis","Coumadin","SCDs","***"} Code Status:  {Blank single:19197::"Full Code","DNR with Intubation","DNR/DNI(Do NOT Intubate)","Comfort Care","***"} Diet:  Family Communication:  ***  Disposition Plan:  ***  Consults:  ***  Admission status:   {Blank single:19197::"Observation","Inpatient"}, {Blank single:19197::"Med-Surg","Telemetry bed","Step Down Unit"}  Severity of Illness: {Observation/Inpatient:21159}    Tereasa Coop, MD [facility]  How to contact the [facility] Attending or Consulting provider 7A - 7P or covering provider during after hours 7P -7A, for this patient.  Check the care team in [facility] and look for a) attending/consulting [facility] provider listed and b) the [facility] team listed Log into [email or web address] and use [facility]'s universal password to access. If you do not have the password, please contact the hospital operator. Locate the [facility] provider you are looking for under [facility] and page to a number that you can be directly reached. If you still have difficulty reaching the provider, please page the [facility] (Director on Call) for the Hospitalists listed on amion for assistance.  02/05/2023, 12:01 AM

## 2023-02-05 NOTE — Procedures (Signed)
Central Venous Catheter Insertion Procedure Note  Todd Mccoy  [id number]  04-May-1971  Date:02/05/23  Time:7:06 AM   Provider Performing:    Procedure: Insertion of Non-tunneled Central Venous Catheter(36556)with US guidance (40981)    Indication(s) Medication administration and  CRRT  Consent Risks of the procedure as well as the alternatives and risks of each were explained to the patient (Oral  Consent for the procedure was obtained . COuld not be signed due to emergent nature + patient is blind and critically ill  Anesthesia Topical only with 1% lidocaine   Timeout Verified patient identification, verified procedure, site/side was marked, verified correct patient position, special equipment/implants available, medications/allergies/relevant history reviewed, required imaging and test results available.  Sterile Technique Maximal sterile technique including full sterile barrier drape, hand hygiene, sterile gown, sterile gloves, mask, hair covering, sterile ultrasound probe cover (if used).  Procedure Description Area of catheter insertion was cleaned with chlorhexidine and draped in sterile fashion.   With real-time ultrasound guidance a HD catheter was placed into the right internal jugular vein.  Nonpulsatile blood flow and easy flushing noted in all ports.  The catheter was sutured in place and sterile dressing applied.  Complications/Tolerance None; patient tolerated the procedure well. Chest X-ray is ordered to verify placement for internal jugular or subclavian cannulation.  Chest x-ray is not ordered for femoral cannulation.  EBL 2-5 cc  Specimen(s) None    SIGNATURE    Dr. [name], M.D., F.C.C.P,  Pulmonary and Critical Care Medicine Staff Physician, [facility] Director - Interstitial Lung Disease  Program  Pulmonary Fibrosis [place] [facility] at [facility] [place], [name],  [address]   Pager: [phone], If no answer  -> Check AMION or Try [phone] Telephone (clinical office): [phone] Telephone (research): [phone]  7:09 AM 02/05/2023

## 2023-02-05 NOTE — Progress Notes (Signed)
Pt tolerating increase in UF rate, vasopressors now off. Per Dr. Glenna Fellows, okay to increase UF to 150/hr net neg as BP tolerates.

## 2023-02-05 NOTE — Progress Notes (Signed)
Initial Nutrition Assessment  DOCUMENTATION CODES:   Obesity unspecified  INTERVENTION:  - Heart Healthy diet per MD. - Recommend Regular diet if intake <50%. - Nepro Shake po TID, each supplement provides 425 kcal and 19 grams protein - Rena-vit to support micronutrient needs. - Monitor weight trends.   NUTRITION DIAGNOSIS:   Increased nutrient needs related to acute illness (AKI on advanced CKD requiring CRRT) as evidenced by estimated needs.  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 Admitted for circulatory shock and volume overload.    Spoke with patient via bedside telephone. He reports a UBW of 220# and feels he may have lost some weight recently. Unsure of when he may have started losing.  Upon inquiring how patient has been eating recently patient took a longer amount of time to answer and sounded drowsy. Suspect patient falling asleep. Will attempt to obtain further nutrition history at next follow up. At end of call patient reported he would be agreeable to receiving nutrition supplements.  Per chart review, weight history over the past year as below: 8/8: 189 3/19: 208 4/6: 208 5/24: 208 8/3: 231  Suspect current weight affected by volume overload. Patient started on HD last night but once transferred to ICU started on CRRT this AM. Currently on a Heart Healthy diet. No intake yet documented.  Will add nutrition supplements to support intake.  Medications reviewed and include: Insulin, Levophed  Labs reviewed:  Potassium 5.9 Creatinine 8.81 Phosphorus 6.4 HA1C 7.2   NUTRITION - FOCUSED PHYSICAL EXAM:  RD working remotely  Diet Order:   Diet Order             Diet Heart Room service appropriate? Yes; Fluid consistency: Thin  Diet effective now                   EDUCATION NEEDS:  No education needs have been identified at this time  Skin:  Skin Assessment: Skin Integrity Issues: Skin Integrity Issues:: Diabetic Ulcer, Other (Comment) Diabetic Ulcer: Right and left posterior thighs; Right anterior knee; Right and left pretibial Other: Non-pressure wound right ankle  Last BM:  8/2  Height:  Ht Readings from Last 1 Encounters:  02/05/23 5\' 10"  (1.778 m)   Weight:  Wt Readings from Last 1 Encounters:  02/05/23 105 kg   Ideal Body Weight:  75.45 kg  BMI:  Body mass index is 33.21 kg/m.  Estimated Nutritional Needs:  Kcal:  2200-2400 kcals Protein:  135-155 grams Fluid:  >/= 2.2L    Todd Mccoy RD, LDN For contact information, refer to [facility].

## 2023-02-05 NOTE — H&P (Incomplete)
History and Physical    Todd Mccoy ZOX:[id number] DOB: Aug 22, 1970 DOA: 02/04/2023  PCP: [name], MD   Patient coming from: Home   Chief Complaint:  Chief Complaint  Patient presents with   Abdominal Pain   Weakness    HPI:  Todd Mccoy is a 52 y.o. male with medical history significant of diabetes mellitus type 2, diabetic retinopathy, CKD stage IV, essential hypertension, left BKA secondary to osteomyelitis, HFrEF 38 to 40%, chronic smoker, hyperlipidemia and amyloidosis,   ED Course: ***  Review of Systems:  ROS  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/2020   pt states recent MD visit /left eye worse than right   Dyslipidemia 12/10/2020   Hx of BKA, left (HCC)    Hypertension    Morbid obesity (HCC)    Osteomyelitis (HCC)    Pneumonia     Past Surgical History:  Procedure Laterality Date   ABSCESS DRAINAGE     neck   AMPUTATION Left 11/14/2020   Procedure: LEFT 5TH RAY AMPUTATION;  Surgeon: Nadara Mustard, MD;  Location: [facility] OR;  Service: Orthopedics;  Laterality: Left;   AMPUTATION Left 12/10/2020   Procedure: LEFT BELOW KNEE AMPUTATION;  Surgeon: Nadara Mustard, MD;  Location: [facility] OR;  Service: Orthopedics;  Laterality: Left;   AMPUTATION TOE Right 03/25/2020   Procedure: AMPUTATION TOE;  Surgeon: Park Liter, DPM;  Location: [facility] ORS;  Service: Podiatry;  Laterality: Right;   INCISION AND DRAINAGE ABSCESS Right 09/07/2014   Procedure: INCISION AND DRAINAGE ABSCESS RIGHT  FLANK;  Surgeon: Avel Peace, MD;  Location: [facility] ORS;  Service: General;  Laterality: Right;   LEG AMPUTATION BELOW KNEE Left 12/10/2020   SCROTUM EXPLORATION       reports that he has been smoking cigarettes. He started smoking about 33 years ago. He has a 16 pack-year smoking history. He has been exposed to tobacco smoke. He has quit using smokeless tobacco. He reports that he does not drink alcohol and does not use drugs.  No Known Allergies  Family History  Problem Relation Age of Onset   Diabetes Mother    Diabetes Father    Diabetes Brother    Colon cancer Neg Hx    Esophageal cancer Neg Hx    Pancreatic cancer Neg Hx    Stomach cancer Neg Hx    Liver disease Neg Hx    CAD Neg Hx     Prior to Admission medications   Medication Sig Start Date End Date Taking? Authorizing Provider  acyclovir ointment (ZOVIRAX) 5 % Apply 1 Application topically every 3 (three) hours. 10/09/22   Rondel Baton, MD  amLODipine (NORVASC) 10 MG tablet TAKE ONE TABLET BY MOUTH ONCE DAILY 10/15/22   Quintella Reichert, MD  atorvastatin (LIPITOR) 20 MG tablet Take 1 tablet (20 mg total) by mouth daily. 06/14/22   Quintella Reichert, MD  Blood Pressure Monitoring (OMRON 3 SERIES BP MONITOR)  DEVI 1 each by Does not apply route daily. 04/08/22   Quintella Reichert, MD  calcitRIOL (ROCALTROL) 0.25 MCG capsule Take 0.25 mcg by mouth daily. 11/07/22   [provider]  carvedilol (COREG) 25 MG tablet Take 1.5 tablets (37.5 mg total) by mouth 2 (two) times daily. 06/17/22   Gaston Islam., NP  cetirizine (ZYRTEC ALLERGY) 10 MG tablet Take 1 tablet (10 mg total) by mouth daily. 10/09/22 11/08/22  Rondel Baton, MD  doxycycline (MONODOX) 50 MG capsule Take 50 mg by mouth 2 (two) times daily. 12/20/22   [provider]  ferrous sulfate 325 (65 FE) MG EC tablet Take 325 mg by mouth daily with breakfast.    [provider]  hydrALAZINE (APRESOLINE) 100 MG tablet Take 1 tablet (100 mg total) by  mouth 2 (two) times daily. 09/21/22   Gaston Islam., NP  hydrOXYzine (ATARAX) 25 MG tablet Take 1 tablet (25 mg total) by mouth at bedtime as needed. Patient not taking: Reported on 01/03/2023 02/24/22   [name], MD  ibuprofen (ADVIL) 600 MG tablet Take 1 tablet (600 mg total) by mouth every 6 (six) hours as needed. 03/11/22   Fayrene Helper, PA-C  insulin glargine (LANTUS) 100 UNIT/ML Solostar Pen Inject 10 Units into the skin at bedtime. 02/24/22 02/24/23  [name], MD  isosorbide mononitrate (IMDUR) 60 MG 24 hr tablet TAKE ONE TABLET BY MOUTH ONCE DAILY 08/19/22   Quintella Reichert, MD  MAGNESIUM-OXIDE 400 (240 Mg) MG tablet Take 1 tablet by mouth daily. 11/07/22   [provider]  Naphazoline HCl (CLEAR EYES OP) Place 1 drop into both eyes as needed (irritation).    [provider]  pregabalin (LYRICA) 50 MG capsule Take 1 capsule by mouth twice daily 01/11/23   [name], MD  Torsemide 40 MG TABS Take 80 mg (2 tablets) by mouth once a day 10/06/22   Gaston Islam., NP  insulin aspart protamine- aspart (NOVOLOG MIX 70/30) (70-30) 100 UNIT/ML injection Inject 0.35 mLs (35 Units total) into the skin 2 (two) times daily. 04/17/20 05/13/20  Anders Simmonds, PA-C  metFORMIN (GLUCOPHAGE-XR) 500 MG 24 hr tablet TAKE 1 TABLET (500 MG TOTAL) BY MOUTH 2 (TWO) TIMES DAILY. 12/19/20 12/22/20  Kendell Bane, MD     Physical Exam: Vitals:   02/04/23 2038 02/04/23 2049 02/04/23 2050 02/04/23 2200  BP: (!) 116/90  (!) 116/90 98/75  Pulse: (!) 53 (!) 52 (!) 52 (!) 49  Resp: 15 14 16  (!) 25  Temp:   (!) 92 F (33.3 C)   TempSrc:   Rectal   SpO2: 99% 98% 99% 100%    Physical Exam   Labs on Admission: I have personally reviewed following labs and imaging studies  CBC: Recent Labs  Lab 02/04/23 2107  WBC 7.0  NEUTROABS 6.2  HGB 8.4*  HCT 28.0*  MCV 87.8  PLT 107*   Basic Metabolic Panel: Recent Labs  Lab 02/04/23 2107  NA 137  K 5.4*  CL 113*  CO2 11*   GLUCOSE 138*  BUN 107*  CREATININE 8.86*  CALCIUM 7.7*   GFR: CrCl cannot be calculated (Unknown ideal weight.). Liver Function Tests: Recent Labs  Lab 02/04/23 2107  AST 11*  ALT 12  ALKPHOS 123  BILITOT 0.6  PROT 7.1  ALBUMIN 2.7*   Recent Labs  Lab 02/04/23 2107  LIPASE 46   No results for input(s): "AMMONIA" in the last 168  hours. Coagulation Profile: No results for input(s): "INR", "PROTIME" in the last 168 hours. Cardiac Enzymes: No results for input(s): "CKTOTAL", "CKMB", "CKMBINDEX", "TROPONINI", "TROPONINIHS" in the last 168 hours. BNP (last 3 results) Recent Labs    02/21/22 1405 06/21/22 1223 02/04/23 2107  BNP 4,003.4* >4,500.0* 2,740.2*   HbA1C: No results for input(s): "HGBA1C" in the last 72 hours. CBG: No results for input(s): "GLUCAP" in the last 168 hours. Lipid Profile: No results for input(s): "CHOL", "HDL", "LDLCALC", "TRIG", "CHOLHDL", "LDLDIRECT" in the last 72 hours. Thyroid Function Tests: No results for input(s): "TSH", "T4TOTAL", "FREET4", "T3FREE", "THYROIDAB" in the last 72 hours. Anemia Panel: No results for input(s): "VITAMINB12", "FOLATE", "FERRITIN", "TIBC", "IRON", "RETICCTPCT" in the last 72 hours. Urine analysis:    Component Value Date/Time   COLORURINE AMBER (A) 02/27/2021 0813   APPEARANCEUR HAZY (A) 02/27/2021 0813   LABSPEC 1.023 02/27/2021 0813   PHURINE 5.0 02/27/2021 0813   GLUCOSEU 50 (A) 02/27/2021 0813   HGBUR MODERATE (A) 02/27/2021 0813   BILIRUBINUR NEGATIVE 02/27/2021 0813   KETONESUR NEGATIVE 02/27/2021 0813   PROTEINUR >=300 (A) 02/27/2021 0813   UROBILINOGEN 1.0 09/06/2014 2307   NITRITE NEGATIVE 02/27/2021 0813   LEUKOCYTESUR NEGATIVE 02/27/2021 0813    Radiological Exams on Admission: I have personally reviewed images CT Renal Stone Study  Result Date: 02/04/2023 CLINICAL DATA:  Left lower quadrant pain, diarrhea, and abdominal distention. EXAM: CT ABDOMEN AND PELVIS WITHOUT CONTRAST TECHNIQUE:  Multidetector CT imaging of the abdomen and pelvis was performed following the standard protocol without IV contrast. RADIATION DOSE REDUCTION: This exam was performed according to the departmental dose-optimization program which includes automated exposure control, adjustment of the mA and/or kV according to patient size and/or use of iterative reconstruction technique. COMPARISON:  09/07/2014. FINDINGS: Lower chest: The heart is enlarged and there is a moderate pericardial effusion measuring up to 1.8 cm. There are small bilateral pleural effusions, greater on the right than on the left. Atelectasis is present at the lung bases. Hepatobiliary: No focal liver abnormality is seen. No gallstones, gallbladder wall thickening, or biliary dilatation. Pancreas: Unremarkable. No pancreatic ductal dilatation or surrounding inflammatory changes. Spleen: Normal in size without focal abnormality. Adrenals/Urinary Tract: The adrenal glands are within normal limits. Hyperdense material is noted in the renal collecting system in the lower pole of the left kidney. No hydroureteronephrosis. Hyperdense material is noted in the urinary bladder on the left in the region of the ureterovesicular junction. Stomach/Bowel: Stomach is within normal limits. Appendix appears normal. No evidence of bowel wall thickening, distention, or inflammatory changes. No free air or pneumatosis. Vascular/Lymphatic: Aortic atherosclerosis. No obvious abdominal or pelvic lymphadenopathy. Examination is limited due to ascites. Reproductive: The prostate gland is enlarged. Other: Large ascites and severe anasarca. Musculoskeletal: Degenerative changes are present in the thoracolumbar spine. No acute osseous abnormality. IMPRESSION: 1. Hyperdense material in the collecting system in the lower pole of the left kidney, possible blood products versus calcification. A small amount hyperdense material is noted in the urinary bladder on the left near the UVJ,  possible blood products. Correlation with urinalysis is recommended. 2. Cardiomegaly with moderate pericardial effusion. 3. Small bilateral pleural effusions with atelectasis at the lung bases. 4. Large ascites. 5. Severe anasarca. 6. Enlarged prostate gland. 7. Aortic atherosclerosis. Electronically Signed   By: Thornell Sartorius M.D.   On: 02/04/2023 23:05    EKG: My personal interpretation of EKG shows: ***    Assessment/Plan: Active Problems:   * No active hospital  problems. *    Assessment and Plan: No notes have been filed under this hospital service. Service: Hospitalist      DVT prophylaxis:  {Blank single:19197::"Lovenox","SQ Heparin","IV heparin gtts","Xarelto","Eliquis","Coumadin","SCDs","***"} Code Status:  {Blank single:19197::"Full Code","DNR with Intubation","DNR/DNI(Do NOT Intubate)","Comfort Care","***"} Diet:  Family Communication:  ***  Disposition Plan:  ***  Consults:  ***  Admission status:   {Blank single:19197::"Observation","Inpatient"}, {Blank single:19197::"Med-Surg","Telemetry bed","Step Down Unit"}  Severity of Illness: {Observation/Inpatient:21159}    Tereasa Coop, MD [facility]  How to contact the [facility] Attending or Consulting provider 7A - 7P or covering provider during after hours 7P -7A, for this patient.  Check the care team in [facility] and look for a) attending/consulting [facility] provider listed and b) the [facility] team listed Log into [email or web address] and use [place]'s universal password to access. If you do not have the password, please contact the hospital operator. Locate the [facility] provider you are looking for under [facility] and page to a number that you can be directly reached. If you still have difficulty reaching the provider, please page the [facility] (Director on Call) for the Hospitalists listed on amion for assistance.  02/05/2023, 12:01 AM

## 2023-02-05 NOTE — ED Notes (Signed)
Called Critical Care@02 :20am.

## 2023-02-05 NOTE — Consult Note (Signed)
WOC Nurse Consult Note: Reason for Consult:right lateral malleolus full thickness wounds and several smaller full and partial thickness wounds to RLE (including ruptured blister to 5th digit). Seen by Vascular Provider Dr. Roney Jaffe on 11/26/22. See notes including photodocumentation from that Encounter. Previous amputation of left limb (AKA by Dr. Lajoyce Corners 12/2022) Wound type:venous insufficiency Pressure Injury POA: N/A Measurement:Per Nursing Flow Sheet Wound ZDG:UYQIHK fibrinous slough Drainage (amount, consistency, odor) moderate serous Periwound: edema Dressing procedure/placement/frequency: I have provided Nursing with guidance for the topical care of the ankle wound and also the other RLE areas of skin loss using a daily cleanse/soak to the malleolar wound and periwound skin using pure hypochlorous acid (Vashe) followed by application of a silver hydrofiber (Aquacel Ag+ Advantage). The pretibial wounds and 5th digit wound are to be dressed following NS cleanse with antimicrobial nonadherent (xeroform) gauze and LE wrapped from to to knee with Kerlix roll gauze, paper tape topped with 6-inch ACE bandage applied in a similar manner. A sacral foam is to be placed, a pressure redistribution heel boot for the Right foot is  to be applied for PI prevention and a pressure redistribution chair pad is provided for OOB use at home and in house while in chair.   WOC nursing team will not follow, but will remain available to this patient, the nursing and medical teams.  Please re-consult if needed.  Thank you for inviting [place] to participate in this patient's Plan of Care.  Ladona Mow, MSN, RN, CNS, GNP, Leda Min, [employer], [employer] phone:  [phone]

## 2023-02-05 NOTE — ED Notes (Signed)
Doctors are in to see patient.

## 2023-02-05 NOTE — Procedures (Signed)
I was present at this dialysis session, have reviewed the session itself and made  appropriate changes  CRRT reviewed - started early this AM  Tolerating UF 50/hr -- rev'd cardiology note, TTE with e/o severe overload without tamponade Increase UF to 100/hr net neg -- ok to increase vasopressor support to facilitate   Estill Bakes MD [facility] pager [phone]   02/05/2023, 11:53 AM

## 2023-02-05 NOTE — Progress Notes (Signed)
eLink Physician-Brief Progress Note Patient Name: Todd Mccoy DOB: 1971/05/24 MRN: [id number]   Date of Service  02/05/2023  HPI/Events of Note  Patient with anxiety and itching Pulling at medical equipment  eICU Interventions  Hydroxyzine 25 mg TID PRN ordered     Intervention Category Minor Interventions: Routine modifications to care plan (e.g. PRN medications for pain, fever)   Mechele Collin 02/05/2023, 9:50 PM

## 2023-02-05 NOTE — Plan of Care (Signed)
Plan of care and goals reviewed with patient, tie given for questions and answers, patient handbook/guide at bedside.  Problem: Education: Goal: Ability to describe self-care measures that may prevent or decrease complications (Diabetes Survival Skills Education) will improve Outcome: Progressing Goal: Individualized Educational Video(s) Outcome: Progressing   Problem: Coping: Goal: Ability to adjust to condition or change in health will improve Outcome: Progressing   Problem: Fluid Volume: Goal: Ability to maintain a balanced intake and output will improve Outcome: Progressing   Problem: Health Behavior/Discharge Planning: Goal: Ability to identify and utilize available resources and services will improve Outcome: Progressing Goal: Ability to manage health-related needs will improve Outcome: Progressing   Problem: Metabolic: Goal: Ability to maintain appropriate glucose levels will improve Outcome: Progressing   Problem: Nutritional: Goal: Maintenance of adequate nutrition will improve Outcome: Progressing Goal: Progress toward achieving an optimal weight will improve Outcome: Progressing   Problem: Skin Integrity: Goal: Risk for impaired skin integrity will decrease Outcome: Progressing   Problem: Tissue Perfusion: Goal: Adequacy of tissue perfusion will improve Outcome: Progressing   Problem: Education: Goal: Knowledge of General Education information will improve Description: Including pain rating scale, medication(s)/side effects and non-pharmacologic comfort measures Outcome: Progressing   Problem: Health Behavior/Discharge Planning: Goal: Ability to manage health-related needs will improve Outcome: Progressing   Problem: Clinical Measurements: Goal: Ability to maintain clinical measurements within normal limits will improve Outcome: Progressing Goal: Will remain free from infection Outcome: Progressing Goal: Diagnostic test results will improve Outcome:  Progressing Goal: Respiratory complications will improve Outcome: Progressing Goal: Cardiovascular complication will be avoided Outcome: Progressing   Problem: Activity: Goal: Risk for activity intolerance will decrease Outcome: Progressing   Problem: Nutrition: Goal: Adequate nutrition will be maintained Outcome: Progressing   Problem: Coping: Goal: Level of anxiety will decrease Outcome: Progressing   Problem: Elimination: Goal: Will not experience complications related to bowel motility Outcome: Progressing Goal: Will not experience complications related to urinary retention Outcome: Progressing   Problem: Pain Managment: Goal: General experience of comfort will improve Outcome: Progressing   Problem: Safety: Goal: Ability to remain free from injury will improve Outcome: Progressing   Problem: Skin Integrity: Goal: Risk for impaired skin integrity will decrease Outcome: Progressing   Problem: Education: Goal: Knowledge of disease and its progression will improve Outcome: Progressing Goal: Individualized Educational Video(s) Outcome: Progressing   Problem: Fluid Volume: Goal: Compliance with measures to maintain balanced fluid volume will improve Outcome: Progressing   Problem: Health Behavior/Discharge Planning: Goal: Ability to manage health-related needs will improve Outcome: Progressing   Problem: Nutritional: Goal: Ability to make healthy dietary choices will improve Outcome: Progressing   Problem: Clinical Measurements: Goal: Complications related to the disease process, condition or treatment will be avoided or minimized Outcome: Progressing

## 2023-02-05 NOTE — Progress Notes (Signed)
eLink Physician-Brief Progress Note Patient Name: VANDER KUEKER DOB: 07/25/1970 MRN: [id number]   Date of Service  02/05/2023  HPI/Events of Note  80M with chronic systolic heart failure, DM2, CKD, HTN, left BKA and recent scalp lesion requiring doxycycline who now p/w weakness, diarrhea and LLQ abdominal pain x 3 days admitted for AoCKD IV. Nephrology consulted K 5.4 Bicarb 11 BUN/Cr 107/8.86. Ca 7.7 Alb 2.7. BNP 2740. Hg 8.4 Plt 107 CT A/P with volume overload including pericardial effusion, bilateral pleural effusions, severe anasarca and large ascites  He became hypotensive and required levo gtt. ABG 7.14/27/72 . UA with WBC and many bacteremia.  Admitted by PCCM for cardiogenic +/- septic shock with renal failure. Bedside echo reports no concern for tamponade  eICU Interventions  Levophed 6 HD with pigtail being placed>coox. Consider inotrope support Diurese x 1. Likely needs CRRT Bicarb gtt     Intervention Category Evaluation Type: New Patient Evaluation   Mechele Collin 02/05/2023, 4:52 AM

## 2023-02-05 NOTE — Progress Notes (Signed)
CL being placed now

## 2023-02-05 NOTE — H&P (Addendum)
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  He has baseline chronic systolic heart failure and status post left foot BKA for osteomyelitis.  He lives with the son and uses a wheelchair.  He established with a heart failure clinic in December 2023 due to worsening anasarca/volume overload.  At that time baseline NYHA class III-4   He presents now with a history of having been on doxycycline for scalp lesions for 1 week.  Subsequently developed diarrhea for 3 days at least 5-7 times each day and for the last 24 hours decreased urine output.  In the ER noticed to be hypothermic with a low bicarb and high potassium and creatinine greater than 8 mg percent.  Diffuse anasarca present.  Hypothermic to 95 Fahrenheit according to ED PA.  Placed on Bair hugger and subsequently became hypotensive requiring Levophed 7 mcg through peripheral vein with a resultant MAP of 85.  CCM bedside echo shows moderate pericardial effusion and reduced ejection fraction but unclear if this tamponade.  ABG shows pH 7.1 10/30/1970 on room air.  Urine analysis shows WBC greater than 50 with many bacteria.  Seen by renal consult and given single dose Lasix. CXR with significant volume overload  Denies dysgeusia, confusion, hiccups but endorses pruritus.  Past Medical History:    has a past medical history of Anemia, Anemia, Anxiety, Chronic combined systolic (congestive) and diastolic (congestive) heart  failure (HCC), CKD (chronic kidney disease), stage IV (HCC) (12/10/2020), Diabetes mellitus with complication (HCC), Diabetic neuropathy (HCC), 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) (03/25/2020), 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) (03/25/2020), Dyslipidemia (12/10/2020), BKA, left (HCC), Hypertension, Morbid obesity (HCC), Osteomyelitis (HCC), and Pneumonia.   reports that he has been smoking cigarettes. He started smoking about 33 years ago. He has a 16 pack-year smoking history. He has been exposed to tobacco smoke. He has quit using smokeless tobacco.  Past Surgical History:  Procedure Laterality Date   ABSCESS DRAINAGE     neck   AMPUTATION Left 11/14/2020   Procedure: LEFT 5TH RAY AMPUTATION;  Surgeon: Nadara Mustard, MD;  Location: [facility] OR;  Service: Orthopedics;  Laterality: Left;   AMPUTATION Left 12/10/2020   Procedure: LEFT BELOW KNEE AMPUTATION;  Surgeon: Nadara Mustard, MD;  Location: [facility] OR;  Service: Orthopedics;  Laterality: Left;   AMPUTATION TOE Right 03/25/2020   Procedure: AMPUTATION TOE;  Surgeon: Park Liter, DPM;  Location: [facility] ORS;  Service: Podiatry;  Laterality: Right;   INCISION AND DRAINAGE ABSCESS Right 09/07/2014   Procedure: INCISION AND DRAINAGE ABSCESS RIGHT FLANK;  Surgeon: Avel Peace, MD;  Location: [facility] OR[facility];  Service: General;  Laterality: Right;   LEG AMPUTATION BELOW KNEE Left 12/10/2020   SCROTUM EXPLORATION      No Known Allergies  Immunization History  Administered Date(s) Administered   Influenza,inj,Quad PF,6+ Mos 06/21/2014   Pneumococcal Polysaccharide-23 06/21/2014    Family History  Problem Relation Age of Onset   Diabetes Mother    Diabetes Father    Diabetes Brother  Colon cancer Neg Hx    Esophageal cancer Neg Hx    Pancreatic cancer Neg Hx    Stomach cancer Neg Hx    Liver disease Neg Hx    CAD Neg Hx      Current Facility-Administered Medications:     prismasol BGK 4/2.5 infusion, , CRRT, Continuous, Tyler Pita, MD    prismasol BGK 4/2.5 infusion, , CRRT, Continuous, Estill Bakes A, MD   0.9 %  sodium chloride infusion, 250 mL, Intravenous, Continuous, [name], MD   [START ON 02/06/2023] cefTRIAXone (ROCEPHIN) 2 g in sodium chloride 0.9 % 100 mL IVPB, 2 g, Intravenous, Q24H, , , MD   docusate sodium (COLACE) capsule 100 mg, 100 mg, Oral, BID PRN, [name], MD   famotidine (PEPCID) IVPB 20 mg premix, 20 mg, Intravenous, Q24H, , , MD   furosemide (LASIX) 160 mg in dextrose 5 % 50 mL IVPB, 160 mg, Intravenous, Once, Carroll Sage, PA-C, Last Rate: 66 mL/hr at 02/05/23 0448, 160 mg at 02/05/23 0448   heparin injection 1,000-6,000 Units, 1,000-6,000 Units, CRRT, PRN, Tyler Pita, MD   heparin injection 5,000 Units, 5,000 Units, Subcutaneous, Q8H, , , MD   insulin aspart (novoLOG) injection 0-6 Units, 0-6 Units, Subcutaneous, Q4H, , , MD   norepinephrine (LEVOPHED) 4mg  in (0.016 mg/mL) premix infusion, 2-10 mcg/min, Intravenous, Titrated, , , MD, Last Rate: 22.5 mL/hr at 02/05/23 0449, 6 mcg/min at 02/05/23 0449   polyethylene glycol (MIRALAX / GLYCOLAX) packet 17 g, 17 g, Oral, Daily PRN, [name], MD   prismasol BGK 4/2.5 infusion, , CRRT, Continuous, Estill Bakes A, MD   sodium bicarbonate 150 mEq in dextrose 5 % 1,150 mL infusion, , Intravenous, Continuous, , , MD   sodium chloride 0.9 % primer fluid for CRRT, , CRRT, PRN, Tyler Pita, MD  Current Outpatient Medications:    acyclovir ointment (ZOVIRAX) 5 %, Apply 1 Application topically every 3 (three) hours., Disp: 15 g, Rfl: 0   amLODipine (NORVASC) 10 MG tablet, TAKE ONE TABLET BY MOUTH ONCE DAILY, Disp: 90 tablet, Rfl: 2   atorvastatin (LIPITOR) 20 MG tablet, Take 1 tablet (20 mg total) by mouth daily., Disp: 90 tablet, Rfl: 3   Blood Pressure Monitoring (OMRON 3 SERIES BP MONITOR) DEVI, 1 each by  Does not apply route daily., Disp: 1 each, Rfl: 0   calcitRIOL (ROCALTROL) 0.25 MCG capsule, Take 0.25 mcg by mouth daily., Disp: , Rfl:    carvedilol (COREG) 25 MG tablet, Take 1.5 tablets (37.5 mg total) by mouth 2 (two) times daily., Disp: 90 tablet, Rfl: 3   cetirizine (ZYRTEC ALLERGY) 10 MG tablet, Take 1 tablet (10 mg total) by mouth daily., Disp: 30 tablet, Rfl: 0   doxycycline (MONODOX) 50 MG capsule, Take 50 mg by mouth 2 (two) times daily., Disp: , Rfl:    ferrous sulfate 325 (65 FE) MG EC tablet, Take 325 mg by mouth daily with breakfast., Disp: , Rfl:    hydrALAZINE (APRESOLINE) 100 MG tablet, Take 1 tablet (100 mg total) by mouth 2 (two) times daily., Disp: 60 tablet, Rfl: 3   hydrOXYzine (ATARAX) 25 MG tablet, Take 1 tablet (25 mg total) by mouth at bedtime as needed. (Patient not taking: Reported on 01/03/2023), Disp: 30 tablet, Rfl: 1   ibuprofen (ADVIL) 600 MG tablet, Take 1 tablet (600 mg total) by mouth every 6 (six) hours as needed., Disp: 30 tablet, Rfl: 0   insulin glargine (LANTUS) 100 UNIT/ML  Solostar Pen, Inject 10 Units into the skin at bedtime., Disp: 9 mL, Rfl: 3   isosorbide mononitrate (IMDUR) 60 MG 24 hr tablet, TAKE ONE TABLET BY MOUTH ONCE DAILY, Disp: 30 tablet, Rfl: 3   MAGNESIUM-OXIDE 400 (240 Mg) MG tablet, Take 1 tablet by mouth daily., Disp: , Rfl:    Naphazoline HCl (CLEAR EYES OP), Place 1 drop into both eyes as needed (irritation)., Disp: , Rfl:    pregabalin (LYRICA) 50 MG capsule, Take 1 capsule by mouth twice daily, Disp: 60 capsule, Rfl: 3   Torsemide 40 MG TABS, Take 80 mg (2 tablets) by mouth once a day, Disp: 60 tablet, Rfl: 3     Significant Hospital Events:  02/04/2023 - admit.    Interim History / Subjective:   02/05/2023 - seen in [place] ER room 12.   Objective   Blood pressure 115/75, pulse (!) 46, temperature (!) 91 F (32.8 C), temperature source Oral, resp. rate 12, SpO2 98%.        Intake/Output Summary (Last 24 hours) at 02/05/2023  0510 Last data filed at 02/05/2023 0302 Gross per 24 hour  Intake 10 ml  Output --  Net 10 ml   There were no vitals filed for this visit.  Examination: General: obse, lying quietly in stretcher HENT: mildy distended JVP. No neck nodes Lungs: Distant. Not in distress. No wheeze Cardiovascular: Bradycardic, Emergenct CCM echo: moderate effusion, per ccardiology - likely not in tamponade Abdomen: distended Extremities: Left BKA. Rt foot in socks. Pressures ulcers + Neuro: ARousable and Oriented x3 .Moves all 4s GU: Penis and scrotum look normal  Resolved Hospital Problem list   x  Assessment & Plan:     Bilateral pleural effusions and volume overload on admission chest x-ray -worse than baseline; likely due to progressive renal failure and cardiorenal syndrome   P:   Monitor with renal management of volume overload  Maintaining mental status but at risk for ICU delirium and obtundation   P:   Avoid benzodiazepines and opioids to the extent possible     Severe hypertension at baseline  - Rx wit coreg, hydralazine Circulatory shock upon admission [shortly after placing Bair hugger]  -Cardiogenic versus septic versus both  -Doubt pericardial tamponade given bradycardia and posterior fusion based on CCM echo   Chronic systolic heart failure with grade 3 diastolic heart failure -followed by Dr. Sofie Hartigan Concern for acute decompensation systolic chf - Present on Admit    P:  Stat echo [cardiology fellow sending in tech;) Place trialysis catheter and get coox -> might need milrinone Based on above consider cardiology consultation (d.w overnight fellow Dr Lily Peer)  > 7 am 02/05/23 Levophed for MAP goal > 65    Sinus bradycardia - at admit - HR 50s  P: Tele   Hx of 1 week doxycycline  for scalp lesion - ending just prior to admit High Prob UTI - at admit  (based on UA results) Rule out Infectious causes for diarrhea - at admit   P:   Check stool PCR  AWait  urine culture      CKD stage 4 with baseline creat 4mg % Enlarged prostate reported on admit CT Acute on chronic renal failure -at admit - following diarrhea but also volume overloaded   P:  Lasix per renal  x 1 dose -> CRRT Check PSA Place HD cath Place foley (done by ED)   Severe Metabolic acidosis due to renal fialure - Present on Admit   P Bic  push -> Bic gtt Place HD  cath-> CRRT     Hyperkalemia - mild at admit  P: Lokelma Bic gtt CRRT    AT risk gastroparesis - due to poorly controlled DM Severe ascites and anasarca  - admit CT abdomen; no evidence of cirrhosis Diarrhea upoin admissioin    P:   NPO Pepcid Await stool studies   Chronic anemia of kidney diseae   P:  - PRBC for hgb </= 6.9gm%    - exceptions are   -  if ACS susepcted/confirmed then transfuse for hgb </= 8.0gm%,  or    -  active bleeding with hemodynamic instability, then transfuse regardless of hemoglobin value   At at all times try to transfuse 1 unit prbc as possible with exception of active hemorrhage    Mild thrombocytopenia - Present on Admit; 107k    P Heparin sq and monitor     T2DM poorly controlled with neuropathy and retinopathy      P:   Ssi Check hgba1c    Left BKA Rt foot ulcers Chronic itching on atarax Diabetic neuropathy -on lyrica at home  Plan  - hold atarax - hold lyrica -     Best practice (daily eval):  Diet: npo Pain/Anxiety/Delirium protocol (if indicated): none VAP protocol (if indicated): x DVT prophylaxis: heparin sq GI prophylaxis: h2 blockade (no ppi due to diaarrha) Glucose control: ssi Mobility: bed rest Code Status: full Family Communication: lives with ? Son. Number listed in [place] nephew - [phone] - kept rinnmgin Disposition: move from ER to ICU    /s DR Glenna Fellows  ATTESTATION & SIGNATURE   The patient Todd Mccoy is critically ill with multiple organ systems failure and requires high  complexity decision making for assessment and support, frequent evaluation and titration of therapies, application of advanced monitoring technologies and extensive interpretation of multiple databases.   Critical Care Time devoted to patient care services described in this note is  75  Minutes. This time reflects time of care of this signee Dr [name]. This critical care time does not reflect procedure time, or teaching time or supervisory time of PA/NP/Med student/Med Resident etc but could involve care discussion time      SIGNATURE    Dr. [name], M.D., F.C.C.P,  Pulmonary and Critical Care Medicine Staff Physician, [facility] Center Director - Interstitial Lung Disease  Program  Pulmonary Fibrosis [facility] [place], [name], [address]  NPI Number:  NPI #[id number]  Pager: [phone], If no answer  -> Check AMION or Try [phone] Telephone (clinical office): [phone] Telephone (research): [phone]  5:10 AM 02/05/2023   LABS    PULMONARY Recent Labs  Lab 02/05/23 0306  PHART 7.14*  PCO2ART 27*  PO2ART 72*  HCO3 9.5*  O2SAT 99.1    CBC Recent Labs  Lab 02/04/23 2107  HGB 8.4*  HCT 28.0*  WBC 7.0  PLT 107*    COAGULATION No results for input(s): "INR" in the last 168 hours.  CARDIAC  No results for input(s): "TROPONINI" in the last 168 hours. No results for input(s): "PROBNP" in the last 168 hours.  CHEMISTRY Recent Labs  Lab 02/04/23 2107  NA 137  K 5.4*  CL 113*  CO2 11*  GLUCOSE 138*  BUN 107*  CREATININE 8.86*  CALCIUM 7.7*   CrCl cannot be calculated (Unknown ideal weight.).   LIVER Recent Labs  Lab 02/04/23 2107  AST 11*  ALT 12  ALKPHOS 123  BILITOT 0.6  PROT 7.1  ALBUMIN 2.7*     INFECTIOUS Recent Labs  Lab 02/04/23 2119  LATICACIDVEN 0.6     ENDOCRINE CBG (last 3)  No results for input(s): "GLUCAP" in the last 72  hours.       IMAGING x48h  - image(s) personally visualized  -   highlighted in bold DG Chest Portable 1 View  Result Date: 02/05/2023 CLINICAL DATA:  Weakness, shortness of breath EXAM: PORTABLE CHEST 1 VIEW COMPARISON:  02/21/2022 FINDINGS: Cardiomegaly, vascular congestion. Very low lung volumes. Diffuse bilateral airspace disease, right greater than left. No visible effusions or pneumothorax. No acute bony abnormality. IMPRESSION: Cardiomegaly with diffuse bilateral airspace disease, right greater than left. Favor edema although infection cannot be excluded. Electronically Signed   By: Charlett Nose M.D.   On: 02/05/2023 01:05   CT Renal Stone Study  Result Date: 02/04/2023 CLINICAL DATA:  Left lower quadrant pain, diarrhea, and abdominal distention. EXAM: CT ABDOMEN AND PELVIS WITHOUT CONTRAST TECHNIQUE: Multidetector CT imaging of the abdomen and pelvis was performed following the standard protocol without IV contrast. RADIATION DOSE REDUCTION: This exam was performed according to the departmental dose-optimization program which includes automated exposure control, adjustment of the mA and/or kV according to patient size and/or use of iterative reconstruction technique. COMPARISON:  09/07/2014. FINDINGS: Lower chest: The heart is enlarged and there is a moderate pericardial effusion measuring up to 1.8 cm. There are small bilateral pleural effusions, greater on the right than on the left. Atelectasis is present at the lung bases. Hepatobiliary: No focal liver abnormality is seen. No gallstones, gallbladder wall thickening, or biliary dilatation. Pancreas: Unremarkable. No pancreatic ductal dilatation or surrounding inflammatory changes. Spleen: Normal in size without focal abnormality. Adrenals/Urinary Tract: The adrenal glands are within normal limits. Hyperdense material is noted in the renal collecting system in the lower pole of the left kidney. No hydroureteronephrosis. Hyperdense material is  noted in the urinary bladder on the left in the region of the ureterovesicular junction. Stomach/Bowel: Stomach is within normal limits. Appendix appears normal. No evidence of bowel wall thickening, distention, or inflammatory changes. No free air or pneumatosis. Vascular/Lymphatic: Aortic atherosclerosis. No obvious abdominal or pelvic lymphadenopathy. Examination is limited due to ascites. Reproductive: The prostate gland is enlarged. Other: Large ascites and severe anasarca. Musculoskeletal: Degenerative changes are present in the thoracolumbar spine. No acute osseous abnormality. IMPRESSION: 1. Hyperdense material in the collecting system in the lower pole of the left kidney, possible blood products versus calcification. A small amount hyperdense material is noted in the urinary bladder on the left near the UVJ, possible blood products. Correlation with urinalysis is recommended. 2. Cardiomegaly with moderate pericardial effusion. 3. Small bilateral pleural effusions with atelectasis at the lung bases. 4. Large ascites. 5. Severe anasarca. 6. Enlarged prostate gland. 7. Aortic atherosclerosis. Electronically Signed   By: Thornell Sartorius M.D.   On: 02/04/2023 23:05

## 2023-02-05 NOTE — ED Notes (Signed)
Pt care taken, just come back from ct scan. Hooked up bair hugger.

## 2023-02-05 NOTE — Consult Note (Signed)
Cardiology Consultation   Patient ID: [name] MRN: [id number]; DOB: 03/29/1971  Admit date: 02/04/2023 Date of Consult: 02/05/2023  PCP:  [name], MD   [facility] Providers Cardiologist:  Armanda Magic, MD   {    Patient Profile:   52 yo admitted with septic / cardiogenic shock     History of Present Illness:   Todd Mccoy is a 52 y.o. 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 Marchelle Gearing.  The patient has been seen by Dr. Max Sane  in the advanced heart failure clinic. He commented at his last office visit in December, 2023 that Todd Mccoy was not a candidate for any advanced therapies for his congestive heart failure and that his prognosis was poor.  He was volume overloaded at that visit and his torsemide was increased, metolazone was added. ,  hydralazine was increase    Since his last visit his creatinine has steadily increased since we last saw him.  His creatinine on admission was 8.86.  He has been anuric for the past 24 hours and is just starting hemodialysis this morning.   He has been hypotensive and was started on Levophed.  He remains acidotic. He was hypothermic on admission.  Echo was pulm performed this morning.  Preliminary interpretation reveals At least moderate LVH.  He has mild to moderate LV dysfunction with EF of 40 to 45%.  There is evidence of diastolic dysfunction.  His right ventricle is markedly dilated.  His right atrium is markedly dilated.  There is all moderate to large pericardial effusion.  There is no evidence of cardiac tamponade.  He was last seen in our office by Robin Searing,  NP   His diuretics were increased   He  presented with several weeks of shortness of breath.  He was compliant with his medications.  He does not eat excessive salt.  He is legally blind and basically wheelchair-bound.  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/2020   pt states recent MD visit /left eye worse than right   Dyslipidemia 12/10/2020   Hx of BKA, left (HCC)    Hypertension    Morbid obesity (HCC)    Osteomyelitis (HCC)    Pneumonia     Past Surgical History:  Procedure Laterality Date   ABSCESS DRAINAGE     neck   AMPUTATION Left 11/14/2020   Procedure: LEFT 5TH RAY AMPUTATION;  Surgeon: Nadara Mustard, MD;  Location: [facility] OR;  Service: Orthopedics;  Laterality: Left;   AMPUTATION Left 12/10/2020   Procedure: LEFT BELOW KNEE AMPUTATION;  Surgeon: Nadara Mustard, MD;  Location: [facility] OR;  Service: Orthopedics;  Laterality: Left;   AMPUTATION TOE Right 03/25/2020  Procedure: AMPUTATION TOE;  Surgeon: Park Liter, DPM;  Location: [facility] ORS;  Service: Podiatry;  Laterality: Right;   INCISION AND DRAINAGE ABSCESS Right 09/07/2014   Procedure: INCISION AND DRAINAGE ABSCESS RIGHT FLANK;  Surgeon: Avel Peace, MD;  Location: [facility] ORS;  Service: General;  Laterality: Right;   LEG AMPUTATION BELOW KNEE Left 12/10/2020   SCROTUM EXPLORATION       Home Medications:  Prior to Admission medications   Medication Sig Start Date End Date Taking? Authorizing Provider  acyclovir ointment (ZOVIRAX) 5 % Apply 1 Application topically every 3 (three) hours. 10/09/22    Rondel Baton, MD  amLODipine (NORVASC) 10 MG tablet TAKE ONE TABLET BY MOUTH ONCE DAILY 10/15/22   Quintella Reichert, MD  atorvastatin (LIPITOR) 20 MG tablet Take 1 tablet (20 mg total) by mouth daily. 06/14/22   Quintella Reichert, MD  calcitRIOL (ROCALTROL) 0.25 MCG capsule Take 0.25 mcg by mouth daily. 11/07/22   [provider]  carvedilol (COREG) 25 MG tablet Take 1.5 tablets (37.5 mg total) by mouth 2 (two) times daily. 06/17/22   Gaston Islam., NP  cetirizine (ZYRTEC ALLERGY) 10 MG tablet Take 1 tablet (10 mg total) by mouth daily. 10/09/22 11/08/22  Rondel Baton, MD  doxycycline (MONODOX) 50 MG capsule Take 50 mg by mouth 2 (two) times daily. 12/20/22   [provider]  ferrous sulfate 325 (65 FE) MG EC tablet Take 325 mg by mouth daily with breakfast.    [provider]  hydrALAZINE (APRESOLINE) 100 MG tablet Take 1 tablet (100 mg total) by mouth 2 (two) times daily. 09/21/22   Gaston Islam., NP  hydrOXYzine (ATARAX) 25 MG tablet Take 1 tablet (25 mg total) by mouth at bedtime as needed. Patient not taking: Reported on 01/03/2023 02/24/22   [name], MD  ibuprofen (ADVIL) 600 MG tablet Take 1 tablet (600 mg total) by mouth every 6 (six) hours as needed. 03/11/22   Fayrene Helper, PA-C  insulin glargine (LANTUS) 100 UNIT/ML Solostar Pen Inject 10 Units into the skin at bedtime. 02/24/22 02/24/23  [name], MD  isosorbide mononitrate (IMDUR) 60 MG 24 hr tablet TAKE ONE TABLET BY MOUTH ONCE DAILY 08/19/22   Quintella Reichert, MD  MAGNESIUM-OXIDE 400 (240 Mg) MG tablet Take 1 tablet by mouth daily. 11/07/22   [provider]  Naphazoline HCl (CLEAR EYES OP) Place 1 drop into both eyes as needed (irritation).    [provider]  pregabalin (LYRICA) 50 MG capsule Take 1 capsule by mouth twice daily 01/11/23   [name], MD  Torsemide 40 MG TABS Take 80 mg (2 tablets) by mouth once a day 10/06/22   Gaston Islam., NP  insulin aspart  protamine- aspart (NOVOLOG MIX 70/30) (70-30) 100 UNIT/ML injection Inject 0.35 mLs (35 Units total) into the skin 2 (two) times daily. 04/17/20 05/13/20  Anders Simmonds, PA-C  metFORMIN (GLUCOPHAGE-XR) 500 MG 24 hr tablet TAKE 1 TABLET (500 MG TOTAL) BY MOUTH 2 (TWO) TIMES DAILY. 12/19/20 12/22/20  Kendell Bane, MD    Inpatient Medications: Scheduled Meds:  heparin  5,000 Units Subcutaneous Q8H   insulin aspart  0-6 Units Subcutaneous Q4H   Continuous Infusions:   prismasol BGK 4/2.5      prismasol BGK 4/2.5     sodium chloride     [START ON 02/06/2023] cefTRIAXone (ROCEPHIN)  IV     famotidine (PEPCID) IV     norepinephrine (  LEVOPHED) Adult infusion 6 mcg/min (02/05/23 0639)   prismasol BGK 4/2.5     sodium bicarbonate 150 mEq in dextrose 5 % 1,150 mL infusion     PRN Meds: docusate sodium, heparin, polyethylene glycol, sodium chloride  Allergies:   No Known Allergies  Social History:   Social History   Socioeconomic History   Marital status: Single    Spouse name: Not on file   Number of children: 1   Years of education: Not on file   Highest education level: Not on file  Occupational History   Not on file  Tobacco Use   Smoking status: Every Day    Current packs/day: 0.00    Average packs/day: 0.5 packs/day for 32.0 years (16.0 ttl pk-yrs)    Types: Cigarettes    Start date: 08/24/1989    Last attempt to quit: 08/24/2021    Years since quitting: 1.4    Passive exposure: Past   Smokeless tobacco: Former   Tobacco comments:    Quit smoking previously February 2023  Vaping Use   Vaping status: Never Used  Substance and Sexual Activity   Alcohol use: No   Drug use: No   Sexual activity: Not on file  Other Topics Concern   Not on file  Social History Narrative   Not on file   Social Determinants of Health   Financial Resource Strain: Low Risk  (09/30/2021)   Overall Financial Resource Strain (CARDIA)    Difficulty of Paying Living Expenses: Not hard at  all  Food Insecurity: No Food Insecurity (11/03/2022)   Hunger Vital Sign    Worried About Running Out of Food in the Last Year: Never true    Ran Out of Food in the Last Year: Never true  Transportation Needs: No Transportation Needs (10/15/2022)   PRAPARE - Administrator, Civil Service (Medical): No    Lack of Transportation (Non-Medical): No  Physical Activity: Inactive (09/30/2021)   Exercise Vital Sign    Days of Exercise per Week: 0 days    Minutes of Exercise per Session: 0 min  Stress: No Stress Concern Present (09/30/2021)   [employer] of Occupational Health - Occupational Stress Questionnaire    Feeling of Stress : Only a little  Social Connections: Moderately Isolated (09/30/2021)   Social Connection and Isolation Panel [NHANES]    Frequency of Communication with Friends and Family: More than three times a week    Frequency of Social Gatherings with Friends and Family: More than three times a week    Attends Religious Services: 1 to 4 times per year    Active Member of [employer] or Organizations: No    Attends Engineer, structural: Never    Marital Status: Never married  Catering manager Violence: Not on file    Family History:    Family History  Problem Relation Age of Onset   Diabetes Mother    Diabetes Father    Diabetes Brother    Colon cancer Neg Hx    Esophageal cancer Neg Hx    Pancreatic cancer Neg Hx    Stomach cancer Neg Hx    Liver disease Neg Hx    CAD Neg Hx      ROS:  Please see the history of present illness.   All other ROS reviewed and negative.     Physical Exam/Data:   Vitals:   02/05/23 0500 02/05/23 0530 02/05/23 0545 02/05/23 0700  BP: 115/75 105/73 122/77 128/81  Pulse: Marland Kitchen)  46 (!) 54 (!) 47 (!) 58  Resp: 12 11 12 14   Temp:   (!) 94.4 F (34.7 C)   TempSrc:      SpO2: 98% 95% 98% 97%  Weight:   105 kg   Height:   5\' 10"  (1.778 m)     Intake/Output Summary (Last 24 hours) at 02/05/2023 0826 Last data filed  at 02/05/2023 2130 Gross per 24 hour  Intake 153.8 ml  Output 0 ml  Net 153.8 ml      02/05/2023    5:45 AM 11/26/2022   10:03 AM 10/09/2022    2:48 PM  Last 3 Weights  Weight (lbs) 231 lb 7.7 oz 209 lb 209 lb  Weight (kg) 105 kg 94.802 kg 94.802 kg     Body mass index is 33.21 kg/m.  General:  chronically ill appearing , middle age male,  blind  in no acute distress HEENT: blind  Neck: R internal jugular catheter in place  Vascular: No carotid bruits; Distal pulses 2+ bilaterally Cardiac:  normal S1, widely split S2,  soft systolic murmur  Lungs: rales 1/2 bilaterally  Abd: distended  Ext:  trace edema of right lower led Musculoskeletal:  left BKA,   R lower leg has multiple ulcerations  Skin:  Neuro:  blindness no focal abnormalities noted Psych:  Normal affect   EKG:   sinus brady,  NS ST abn.   Telemetry:  sinus rhythm   Relevant CV Studies:    Laboratory Data:  High Sensitivity Troponin:  No results for input(s): "TROPONINIHS" in the last 720 hours.   Chemistry Recent Labs  Lab 02/04/23 2107 02/05/23 0451  NA 137 137  K 5.4* 5.9*  CL 113* 114*  CO2 11* 11*  GLUCOSE 138* 141*  BUN 107* 101*  CREATININE 8.86* 8.81*  8.60*  CALCIUM 7.7* 7.5*  MG  --  1.8  1.7  GFRNONAA 7* 7*  7*  ANIONGAP 13 12    Recent Labs  Lab 02/04/23 2107  PROT 7.1  ALBUMIN 2.7*  AST 11*  ALT 12  ALKPHOS 123  BILITOT 0.6   Lipids No results for input(s): "CHOL", "TRIG", "HDL", "LABVLDL", "LDLCALC", "CHOLHDL" in the last 168 hours.  Hematology Recent Labs  Lab 02/04/23 2107 02/05/23 0451  WBC 7.0 7.4  RBC 3.19* 3.13*  HGB 8.4* 8.1*  HCT 28.0* 27.4*  MCV 87.8 87.5  MCH 26.3 25.9*  MCHC 30.0 29.6*  RDW 23.1* 23.2*  PLT 107* 112*   Thyroid No results for input(s): "TSH", "FREET4" in the last 168 hours.  BNP Recent Labs  Lab 02/04/23 2107 02/05/23 0451  BNP 2,740.2* 2,575.0*    DDimer No results for input(s): "DDIMER" in the last 168  hours.   Radiology/Studies:  DG Chest Port 1 View  Result Date: 02/05/2023 CLINICAL DATA:  Central line placement EXAM: PORTABLE CHEST 1 VIEW COMPARISON:  02/05/2023, 12:47 a.m. FINDINGS: Gross cardiomegaly. Diffuse bilateral interstitial pulmonary opacity. Interval placement right neck multi lumen vascular catheter, tip near the superior cavoatrial junction. Osseous structures unremarkable. IMPRESSION: 1. Interval placement right neck multi lumen vascular catheter, tip near the superior cavoatrial junction. 2. Gross cardiomegaly and diffuse bilateral interstitial pulmonary opacity, most consistent with edema. Electronically Signed   By: Jearld Lesch M.D.   On: 02/05/2023 07:49   DG Chest Portable 1 View  Result Date: 02/05/2023 CLINICAL DATA:  Weakness, shortness of breath EXAM: PORTABLE CHEST 1 VIEW COMPARISON:  02/21/2022 FINDINGS: Cardiomegaly, vascular congestion. Very low lung  volumes. Diffuse bilateral airspace disease, right greater than left. No visible effusions or pneumothorax. No acute bony abnormality. IMPRESSION: Cardiomegaly with diffuse bilateral airspace disease, right greater than left. Favor edema although infection cannot be excluded. Electronically Signed   By: Charlett Nose M.D.   On: 02/05/2023 01:05   CT Renal Stone Study  Result Date: 02/04/2023 CLINICAL DATA:  Left lower quadrant pain, diarrhea, and abdominal distention. EXAM: CT ABDOMEN AND PELVIS WITHOUT CONTRAST TECHNIQUE: Multidetector CT imaging of the abdomen and pelvis was performed following the standard protocol without IV contrast. RADIATION DOSE REDUCTION: This exam was performed according to the departmental dose-optimization program which includes automated exposure control, adjustment of the mA and/or kV according to patient size and/or use of iterative reconstruction technique. COMPARISON:  09/07/2014. FINDINGS: Lower chest: The heart is enlarged and there is a moderate pericardial effusion measuring up to 1.8 cm.  There are small bilateral pleural effusions, greater on the right than on the left. Atelectasis is present at the lung bases. Hepatobiliary: No focal liver abnormality is seen. No gallstones, gallbladder wall thickening, or biliary dilatation. Pancreas: Unremarkable. No pancreatic ductal dilatation or surrounding inflammatory changes. Spleen: Normal in size without focal abnormality. Adrenals/Urinary Tract: The adrenal glands are within normal limits. Hyperdense material is noted in the renal collecting system in the lower pole of the left kidney. No hydroureteronephrosis. Hyperdense material is noted in the urinary bladder on the left in the region of the ureterovesicular junction. Stomach/Bowel: Stomach is within normal limits. Appendix appears normal. No evidence of bowel wall thickening, distention, or inflammatory changes. No free air or pneumatosis. Vascular/Lymphatic: Aortic atherosclerosis. No obvious abdominal or pelvic lymphadenopathy. Examination is limited due to ascites. Reproductive: The prostate gland is enlarged. Other: Large ascites and severe anasarca. Musculoskeletal: Degenerative changes are present in the thoracolumbar spine. No acute osseous abnormality. IMPRESSION: 1. Hyperdense material in the collecting system in the lower pole of the left kidney, possible blood products versus calcification. A small amount hyperdense material is noted in the urinary bladder on the left near the UVJ, possible blood products. Correlation with urinalysis is recommended. 2. Cardiomegaly with moderate pericardial effusion. 3. Small bilateral pleural effusions with atelectasis at the lung bases. 4. Large ascites. 5. Severe anasarca. 6. Enlarged prostate gland. 7. Aortic atherosclerosis. Electronically Signed   By: Thornell Sartorius M.D.   On: 02/04/2023 23:05     Assessment and Plan:   Acute on chronic biventricular CHF:   Todd Mccoy is is markedly volume overloaded.  He has acute renal insufficiency and has been  anuric for the past 24 hours.  He has just started hemodialysis but was not on dialysis previously. LVEF is 40-45%. Has massive RV overload    He is markedly dilated on exam-has bilateral bilateral rales and has marked splitting of his second heart sound due to his marked RV enlargement.  He has a moderate to large pericardial effusion but there is no evidence of cardiac tamponade.    Continue inotropic support per the critical care team.  There were no plans for cardiac procedure.  2.  Acute renal failure :  getting HD at present   3.  HTN:  is on shock / hypotensive  at present .  Is massively volume overloaded .  Continue norepi      New York Heart Association (NYHA) Functional Class NYHA Class IV        For questions or updates, please contact [facility] Please consult [email or web address] for contact info under  Signed, Kristeen Miss, MD  02/05/2023 8:26 AM

## 2023-02-05 NOTE — ED Provider Notes (Cosign Needed Addendum)
Patient was received at shift change from Fayrene Helper [facility] please see his note for full detail  In short patient with medical history including diabetes, morbid obesity, hypertension, CKD, presenting with weakness, going on for last 3 days, has frequent diarrhea about 5 bouts of diarrhea daily does not believe there is any blood in it.  States has been on doxycycline for last week for a infection on his head.  Endorsing any chest pain pressures or breath at this time, states that he has had decreased urination over the last 24 hours.    Per previous provider is going admitted for AKI likely due to diarrheal illness, patient was admitted to [facility] but needs consult by nephrology   Consults  Spoke with Dr. Glenna Fellows who feels patient can remain at [facility] long, if patient appears to be volume overload can be diuresed, if he appears to be dry can provide gentle rehydration, will be available for consultation in the morning.  Spoke with Dr. Janalyn Shy who was updated on recommendations, she will see the patient right away and request an we continue manage patient until she is able to consult on the patient.  Spoke with Dr. Marchelle Gearing of critical care, he will come and assess the patient, would like a arterial gas on patient, and reconsult with neurology as likely patient will need hemodialysis  Spoke with Dr. Glenna Fellows nephrology, recommend starting patient on Lasix 160 mg, 5 patient with Lokelma, bicarb, insert Foley, she will come and assess the patient  Assessment  I reassessed the patient, he was borderline hypotensive, significance edema present lower limbs, chronic lymphedema changes in the legs, patient had notable rales in both lobes bilaterally, reviewed patient's CT scan reveal moderate pericardial effusion, with Dr. Eudelia Bunch present evaluate patient's heart with ultrasound which was negative for evidence of pericardial effusion, patient's IVC was noncompressible, currently maintaining maps above 65, will  continue to monitor.  Will add on additional lab workup including chest x-ray, EKG and provide additional 500cc of fluids to help maintain BP.  UA is concerning for infection, will start on antibiotics, patient is BP has a MAP of 64, will start on Levophed, consult with critical care for further recommendations.   Foley was placed, MAP has improved while on Levophed, currently resting comfortably, patient will be admitted to the ICU and will undergo further treatment.  .Critical Care  Performed by: Carroll Sage, PA-C Authorized by: Carroll Sage, PA-C   Critical care provider statement:    Critical care time (minutes):  75   Critical care time was exclusive of:  Separately billable procedures and treating other patients   Critical care was necessary to treat or prevent imminent or life-threatening deterioration of the following conditions:  Renal failure, cardiac failure and sepsis   Critical care was time spent personally by me on the following activities:  Development of treatment plan with patient or surrogate, discussions with consultants, evaluation of patient's response to treatment, examination of patient, ordering and review of laboratory studies, ordering and review of radiographic studies, ordering and performing treatments and interventions, pulse oximetry, re-evaluation of patient's condition and review of old charts   I assumed direction of critical care for this patient from another provider in my specialty: no     Care discussed with: admitting provider   BLADDER CATHETERIZATION  Date/Time: 02/05/2023 5:03 AM  Performed by: Carroll Sage, PA-C Authorized by: Carroll Sage, PA-C   Consent:    Consent obtained:  Verbal   Consent  given by:  Patient   Risks, benefits, and alternatives were discussed: yes     Risks discussed:  Infection, urethral injury, pain, incomplete procedure and false passage   Alternatives discussed:  No treatment, delayed  treatment, alternative treatment, observation and referral Universal protocol:    Patient identity confirmed:  Verbally with patient Pre-procedure details:    Procedure purpose:  Diagnostic   Preparation: Patient was prepped and draped in usual sterile fashion   Anesthesia:    Anesthesia method:  None Procedure details:    Provider performed due to:  Nurse unavailable   Catheter insertion:  Indwelling   Catheter type:  Foley   Catheter size:  16 Fr   Bladder irrigation: no     Number of attempts:  1   Urine characteristics:  Clear Post-procedure details:    Procedure completion:  Tolerated well, no immediate complications         Carroll Sage, PA-C 02/05/23 0504    Carroll Sage, PA-C 02/05/23 2208    Nira Conn, MD 02/08/23 [phone]

## 2023-02-06 DIAGNOSIS — I5043 Acute on chronic combined systolic (congestive) and diastolic (congestive) heart failure: Secondary | ICD-10-CM | POA: Diagnosis not present

## 2023-02-06 DIAGNOSIS — R579 Shock, unspecified: Secondary | ICD-10-CM | POA: Diagnosis not present

## 2023-02-06 LAB — GLUCOSE, CAPILLARY
Glucose-Capillary: 60 mg/dL — ABNORMAL LOW (ref 70–99)
Glucose-Capillary: 61 mg/dL — ABNORMAL LOW (ref 70–99)
Glucose-Capillary: 68 mg/dL — ABNORMAL LOW (ref 70–99)
Glucose-Capillary: 70 mg/dL (ref 70–99)
Glucose-Capillary: 71 mg/dL (ref 70–99)
Glucose-Capillary: 73 mg/dL (ref 70–99)
Glucose-Capillary: 77 mg/dL (ref 70–99)
Glucose-Capillary: 83 mg/dL (ref 70–99)
Glucose-Capillary: 94 mg/dL (ref 70–99)

## 2023-02-06 LAB — RENAL FUNCTION PANEL
Albumin: 2.4 g/dL — ABNORMAL LOW (ref 3.5–5.0)
Albumin: 2.1 g/dL — ABNORMAL LOW (ref 3.5–5.0)
Anion gap: 10 (ref 5–15)
Anion gap: 10 (ref 5–15)
BUN: 67 mg/dL — ABNORMAL HIGH (ref 6–20)
BUN: 57 mg/dL — ABNORMAL HIGH (ref 6–20)
CO2: 16 mmol/L — ABNORMAL LOW (ref 22–32)
CO2: 20 mmol/L — ABNORMAL LOW (ref 22–32)
Calcium: 7.5 mg/dL — ABNORMAL LOW (ref 8.9–10.3)
Calcium: 7.5 mg/dL — ABNORMAL LOW (ref 8.9–10.3)
Chloride: 110 mmol/L (ref 98–111)
Chloride: 109 mmol/L (ref 98–111)
Creatinine, Ser: 5.25 mg/dL — ABNORMAL HIGH (ref 0.61–1.24)
Creatinine, Ser: 5.08 mg/dL — ABNORMAL HIGH (ref 0.61–1.24)
GFR, Estimated: 13 mL/min — ABNORMAL LOW (ref >60)
GFR, Estimated: 13 mL/min — ABNORMAL LOW (ref >60)
Glucose, Bld: 83 mg/dL (ref 70–99)
Glucose, Bld: 82 mg/dL (ref 70–99)
Phosphorus: 3.4 mg/dL (ref 2.5–4.6)
Phosphorus: 3.1 mg/dL (ref 2.5–4.6)
Potassium: 4.9 mmol/L (ref 3.5–5.1)
Potassium: 4.7 mmol/L (ref 3.5–5.1)
Sodium: 136 mmol/L (ref 135–145)
Sodium: 139 mmol/L (ref 135–145)

## 2023-02-06 LAB — COOXEMETRY PANEL
Carboxyhemoglobin: 2.9 % — ABNORMAL HIGH (ref 0.5–1.5)
Methemoglobin: 0.7 % (ref 0.0–1.5)
O2 Saturation: 88.7 %
Total hemoglobin: 8.2 g/dL — ABNORMAL LOW (ref 12.0–16.0)

## 2023-02-06 LAB — HEPATITIS B SURFACE ANTIGEN: Hepatitis B Surface Ag: NONREACTIVE

## 2023-02-06 LAB — HEMOGLOBIN A1C
Hgb A1c MFr Bld: 5.6 % (ref 4.8–5.6)
Mean Plasma Glucose: 114.02 mg/dL

## 2023-02-06 LAB — MAGNESIUM: Magnesium: 1.9 mg/dL (ref 1.7–2.4)

## 2023-02-06 MED ORDER — DEXTROSE 50 % IV SOLN
12.5000 g | INTRAVENOUS | Status: AC
  Administered 2023-02-06: 12.5 g via INTRAVENOUS

## 2023-02-06 MED ORDER — HYDROMORPHONE HCL 1 MG/ML IJ SOLN
1.0000 mg | Freq: Once | INTRAMUSCULAR | Status: AC
  Administered 2023-02-06: 1 mg via INTRAVENOUS
  Filled 2023-02-06: qty 1

## 2023-02-06 MED ORDER — MUPIROCIN 2 % EX OINT
1.0000 | TOPICAL_OINTMENT | Freq: Two times a day (BID) | CUTANEOUS | Status: AC
  Administered 2023-02-06 – 2023-02-11 (×9): 1 via NASAL
  Filled 2023-02-06 (×4): qty 22

## 2023-02-06 MED ORDER — CHLORHEXIDINE GLUCONATE CLOTH 2 % EX PADS
6.0000 | MEDICATED_PAD | Freq: Every day | CUTANEOUS | Status: AC
  Administered 2023-02-08 – 2023-02-11 (×4): 6 via TOPICAL

## 2023-02-06 MED ORDER — DEXMEDETOMIDINE HCL IN NACL 200 MCG/50ML IV SOLN
INTRAVENOUS | Status: AC
  Filled 2023-02-06: qty 50

## 2023-02-06 MED ORDER — DEXMEDETOMIDINE HCL IN NACL 400 MCG/100ML IV SOLN
0.0000 ug/kg/h | INTRAVENOUS | Status: DC
  Administered 2023-02-06: 0.4 ug/kg/h via INTRAVENOUS
  Administered 2023-02-07: 0.6 ug/kg/h via INTRAVENOUS
  Filled 2023-02-06 (×2): qty 100

## 2023-02-06 MED ORDER — DEXMEDETOMIDINE HCL IN NACL 200 MCG/50ML IV SOLN
0.0000 ug/kg/h | INTRAVENOUS | Status: DC
  Administered 2023-02-06: 0.2 ug/kg/h via INTRAVENOUS
  Administered 2023-02-06 (×2): 0.4 ug/kg/h via INTRAVENOUS
  Filled 2023-02-06 (×4): qty 50

## 2023-02-06 MED ORDER — DEXTROSE 50 % IV SOLN
INTRAVENOUS | Status: AC
  Filled 2023-02-06: qty 50

## 2023-02-06 MED ORDER — CHLORHEXIDINE GLUCONATE CLOTH 2 % EX PADS: 6.0000 | MEDICATED_PAD | Freq: Every day | CUTANEOUS | Status: DC

## 2023-02-06 NOTE — Progress Notes (Signed)
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  He has baseline chronic systolic heart failure and status post left foot BKA for osteomyelitis.  He lives with the son and uses a wheelchair.  He established with a heart failure clinic in December 2023 due to worsening anasarca/volume overload.  At that time baseline NYHA class III-4     He presents now with a history of having been on doxycycline for scalp lesions for 1 week.  Subsequently developed diarrhea for 3 days at least 5-7 times each day and for the last 24 hours decreased urine output.  In the ER noticed to be hypothermic with a low bicarb and high potassium and creatinine greater than 8 mg percent.  Diffuse anasarca present.  Hypothermic to 95 Fahrenheit according to ED PA.  Placed on Bair hugger and subsequently became hypotensive requiring Levophed 7 mcg through peripheral vein with a resultant MAP of 85.  CCM bedside echo shows moderate pericardial effusion and reduced ejection fraction but unclear if this tamponade.  ABG shows pH 7.1 10/30/1970 on room air.  Urine analysis shows WBC greater than 50 with many bacteria.  Seen by renal consult and given single dose Lasix. CXR with significant volume overload   Denies dysgeusia, confusion, hiccups but endorses pruritus.  Pertinent  Medical History  Obesity HFrEF DMII - neuropathy, retinopathy BKA, left HTN CKDIV Osteomyelitis  Significant Hospital Events: Including procedures, antibiotic start and stop dates in addition to other pertinent  events   8/3 admitted to ICU, started on CRRT, delirious overnight and placed on precedex  Interim History / Subjective:   Increased agitation overnight, started on precedex Unable to get in touch with family to get history on possible alcohol abuse  Objective   Blood pressure (!) 146/91, pulse 63, temperature 97.7 F (36.5 C), temperature source Oral, resp. rate 13, height 5\' 10"  (1.778 m), weight 101.2 kg, SpO2 97%.        Intake/Output Summary (Last 24 hours) at 02/06/2023 0831 Last data filed at 02/06/2023 0800 Gross per 24 hour  Intake 1236.67 ml  Output 4130.8 ml  Net -2894.13 ml   Filed Weights   02/05/23 0545 02/06/23 0500  Weight: 105 kg 101.2 kg    Examination: General: Chronically ill appearing middle-aged male, no acute distress HENT: [place]/AT, moist mucous membranes, sclera anicteric Lungs: Clear to auscultation bilaterally, no wheezing Cardiovascular: Regular rate and rhythm, no murmurs Abdomen: Soft, nontender, nondistended, bowel sounds present Extremities: Left BKA, right lower leg with multiple ulcerations Neuro: Blindness GU: N/A  Resolved Hospital Problem list   Shock Hyperkalemia  Assessment & Plan:   Sepsis Concern for UTI -Cultures are no growth to date -Continue empiric ceftriaxone for coverage -Has been weaned off vasopressor support since yesterday  AKI on CKD4 Severe metabolic acidosis -Appreciate nephrology recommendations -Continue CRRT for volume removal, plan to transfer to [facility] for [facility]  Acute on chronic biventricular congestive heart failure Volume overload with bilateral pleural effusions Hypertension Moderate pericardial effusion without tamponade -Appreciate cardiology recommendations  Acute metabolic encephalopathy Agitation -Precedex as needed -Monitor mental status, consider CT head for  further evaluation  Anemia of chronic disease Thrombocytopenia -Monitor H/H, transfuse for hemoglobin less than 7  g/dL    Best Practice (right click and "Reselect all SmartList Selections" daily)   Diet/type: NPO w/ oral meds DVT prophylaxis: prophylactic heparin  GI prophylaxis: N/A Lines: Central line Foley:  Yes, and it is still needed Code Status:  full code Last date of multidisciplinary goals of care discussion [pending, unable to get in touch with family]  Labs   CBC: Recent Labs  Lab 02/04/23 2107 02/05/23 0451  WBC 7.0 7.4  NEUTROABS 6.2  --   HGB 8.4* 8.1*  HCT 28.0* 27.4*  MCV 87.8 87.5  PLT 107* 112*    Basic Metabolic Panel: Recent Labs  Lab 02/04/23 2107 02/05/23 0451 02/05/23 1531 02/06/23 0323  NA 137 137 138 136  K 5.4* 5.9* 5.3* 4.9  CL 113* 114* 113* 110  CO2 11* 11* 12* 16*  GLUCOSE 138* 141* 109* 83  BUN 107* 101* 90* 67*  CREATININE 8.86* 8.81*  8.60* 7.08* 5.25*  CALCIUM 7.7* 7.5* 7.5* 7.5*  MG  --  1.8  1.7  --  1.9  PHOS  --  6.4* 4.6 3.4   GFR: Estimated Creatinine Clearance: 20.1 mL/min (A) (by C-G formula based on SCr of 5.25 mg/dL (H)). Recent Labs  Lab 02/04/23 2107 02/04/23 2119 02/05/23 0451 02/05/23 0503 02/05/23 0723  PROCALCITON  --   --  0.19  --   --   WBC 7.0  --  7.4  --   --   LATICACIDVEN  --  0.6  --  0.8 0.5    Liver Function Tests: Recent Labs  Lab 02/04/23 2107 02/05/23 1531 02/06/23 0323  AST 11*  --   --   ALT 12  --   --   ALKPHOS 123  --   --   BILITOT 0.6  --   --   PROT 7.1  --   --   ALBUMIN 2.7* 2.5* 2.4*   Recent Labs  Lab 02/04/23 2107  LIPASE 46   No results for input(s): "AMMONIA" in the last 168 hours.  ABG    Component Value Date/Time   PHART 7.3 (L) 02/05/2023 1605   PCO2ART 28 (L) 02/05/2023 1605   PO2ART 83 02/05/2023 1605   HCO3 13.8 (L) 02/05/2023 1605   ACIDBASEDEF 11.5 (H) 02/05/2023 1605   O2SAT 88.7 02/06/2023 0631     Coagulation Profile: No results for input(s): "INR", "PROTIME" in the last 168 hours.  Cardiac Enzymes: No results for input(s): "CKTOTAL", "CKMB",  "CKMBINDEX", "TROPONINI" in the last 168 hours.  HbA1C: HbA1c, POC (controlled diabetic range)  Date/Time Value Ref Range Status  10/27/2022 08:50 AM 6.9 0.0 - 7.0 % Final  02/24/2022 04:07 PM 6.9 0.0 - 7.0 % Final    CBG: Recent Labs  Lab 02/05/23 1546 02/05/23 1939 02/05/23 2343 02/06/23 0320 02/06/23 0747  GLUCAP 94 102* 114* 77 71    Critical care time: 40 minutes    Melody Comas, MD [facility] Office: [phone]   See Amion for personal pager PCCM on call pager [phone] until 7pm. Please call Elink 7p-7a. [phone]

## 2023-02-06 NOTE — Progress Notes (Signed)
Rounding Note    Patient Name: Todd Mccoy Date of Encounter: 02/06/2023  [facility] Cardiologist: Armanda Magic, MD    Subjective    52 yo admitted with acute on chronic renal failure, anuria, biventricular CHF, altered mental status   Hemodynamics are improved. Off Levophed  Sedation has been turned off. He remains somulent, minimally responsive       Inpatient Medications    Scheduled Meds:  Chlorhexidine Gluconate Cloth  6 each Topical Daily   feeding supplement (NEPRO CARB STEADY)  237 mL Oral TID BM   heparin  5,000 Units Subcutaneous Q8H   insulin aspart  0-6 Units Subcutaneous Q4H   multivitamin  1 tablet Oral QHS   mupirocin ointment  1 Application Nasal BID   Continuous Infusions:   prismasol BGK 4/2.5 400 mL/hr at 02/05/23 2128    prismasol BGK 4/2.5 400 mL/hr at 02/05/23 2126   sodium chloride 20 mL/hr at 02/06/23 0800   cefTRIAXone (ROCEPHIN)  IV Stopped (02/06/23 0104)   dexmedetomidine (PRECEDEX) IV infusion Stopped (02/06/23 0751)   famotidine (PEPCID) IV Stopped (02/06/23 0548)   norepinephrine (LEVOPHED) Adult infusion Stopped (02/05/23 1246)   prismasol BGK 4/2.5 1,500 mL/hr at 02/06/23 0505   PRN Meds: docusate sodium, heparin, hydrOXYzine, mouth rinse, polyethylene glycol, sodium chloride   Vital Signs    Vitals:   02/06/23 0600 02/06/23 0630 02/06/23 0700 02/06/23 0730  BP: (!) 141/90 (!) 145/82 (!) 141/90 (!) 146/91  Pulse: 63 63 (!) 58 63  Resp: 14 13 13 13   Temp:      TempSrc:      SpO2: 96% 95% 97% 97%  Weight:      Height:        Intake/Output Summary (Last 24 hours) at 02/06/2023 0825 Last data filed at 02/06/2023 0800 Gross per 24 hour  Intake 1236.67 ml  Output 4130.8 ml  Net -2894.13 ml      02/06/2023    5:00 AM 02/05/2023    5:45 AM 11/26/2022   10:03 AM  Last 3 Weights  Weight (lbs) 223 lb 1.7 oz 231 lb 7.7 oz 209 lb  Weight (kg) 101.2 kg 105 kg 94.802 kg      Telemetry    Nsr   - Personally  Reviewed  ECG     - Personally Reviewed  Physical Exam   WNU:UVOZDG age man, lying in bed    Neck: No JVD Cardiac: RRR,soft systolic murmur Respiratory: Clear to auscultation bilaterally. GI: Soft, nontender, non-distended  MS: s/p left BKA,  multiple ulcerations on his right lower leg  Neuro:  minimally responsive  Psych:unable to assess      Labs    High Sensitivity Troponin:  No results for input(s): "TROPONINIHS" in the last 720 hours.   Chemistry Recent Labs  Lab 02/04/23 2107 02/05/23 0451 02/05/23 1531 02/06/23 0323  NA 137 137 138 136  K 5.4* 5.9* 5.3* 4.9  CL 113* 114* 113* 110  CO2 11* 11* 12* 16*  GLUCOSE 138* 141* 109* 83  BUN 107* 101* 90* 67*  CREATININE 8.86* 8.81*  8.60* 7.08* 5.25*  CALCIUM 7.7* 7.5* 7.5* 7.5*  MG  --  1.8  1.7  --  1.9  PROT 7.1  --   --   --   ALBUMIN 2.7*  --  2.5* 2.4*  AST 11*  --   --   --   ALT 12  --   --   --   [facility]  123  --   --   --   BILITOT 0.6  --   --   --   GFRNONAA 7* 7*  7* 9* 13*  ANIONGAP 13 12 13 10     Lipids No results for input(s): "CHOL", "TRIG", "HDL", "LABVLDL", "LDLCALC", "CHOLHDL" in the last 168 hours.  Hematology Recent Labs  Lab 02/04/23 2107 02/05/23 0451  WBC 7.0 7.4  RBC 3.19* 3.13*  HGB 8.4* 8.1*  HCT 28.0* 27.4*  MCV 87.8 87.5  MCH 26.3 25.9*  MCHC 30.0 29.6*  RDW 23.1* 23.2*  PLT 107* 112*   Thyroid No results for input(s): "TSH", "FREET4" in the last 168 hours.  BNP Recent Labs  Lab 02/04/23 2107 02/05/23 0451  BNP 2,740.2* 2,575.0*    DDimer No results for input(s): "DDIMER" in the last 168 hours.   Radiology    ECHOCARDIOGRAM COMPLETE  Result Date: 02/05/2023    ECHOCARDIOGRAM REPORT   Patient Name:   Todd Mccoy Date of Exam: 02/05/2023 Medical Rec #:  [id number]           Height:       70.0 in Accession #:    [id number]          Weight:       231.5 lb Date of Birth:  1971/05/15           BSA:          2.221 m Patient Age:    52 years            BP:            128/81 mmHg Patient Gender: M                   HR:           67 bpm. Exam Location:  Inpatient Procedure: 2D Echo, Cardiac Doppler and Color Doppler STAT ECHO Indications:     Dilated cardiomyopathy; pericardial effusion  History:         Patient has prior history of Echocardiogram examinations, most                  recent 01/23/2022. CHF, CKD and PAD, Signs/Symptoms:anasarca;                  Risk Factors:Dyslipidemia, Diabetes, Hypertension and Current                  Smoker. Shock, abscesses, sepsis, cellulitis.  Sonographer:     Wallie Char Referring Phys:  [id number] [name] Diagnosing Phys: Weston Brass MD  Sonographer Comments: Messaged MD @ 8:48am IMPRESSIONS  1. Left ventricular ejection fraction, by estimation, is 40 to 45%. The left ventricle has mildly decreased function. The left ventricle demonstrates global hypokinesis. There is moderate left ventricular hypertrophy. Left ventricular diastolic parameters are consistent with Grade III diastolic dysfunction (restrictive).  2. Right ventricular systolic function is moderately reduced. The right ventricular size is moderately enlarged. There is severely elevated pulmonary artery systolic pressure. The estimated right ventricular systolic pressure is 67.7 mmHg.  3. Left atrial size was moderately dilated.  4. Right atrial size was moderately dilated.  5. Moderate pericardial effusion. The pericardial effusion is circumferential. There is no definite evidence of cardiac tamponade by echocardiogram (BP and HR noted above).  6. The mitral valve is normal in structure. Trivial mitral valve regurgitation. No evidence of mitral stenosis.  7. The aortic valve is tricuspid. Aortic valve regurgitation is not visualized. No  aortic stenosis is present.  8. The inferior vena cava is dilated in size with <50% respiratory variability, suggesting right atrial pressure of 15 mmHg. FINDINGS  Left Ventricle: Left ventricular ejection fraction, by estimation, is  40 to 45%. The left ventricle has mildly decreased function. The left ventricle demonstrates global hypokinesis. The left ventricular internal cavity size was normal in size. There is  moderate left ventricular hypertrophy. Left ventricular diastolic parameters are consistent with Grade III diastolic dysfunction (restrictive). Right Ventricle: The right ventricular size is moderately enlarged. No increase in right ventricular wall thickness. Right ventricular systolic function is moderately reduced. There is severely elevated pulmonary artery systolic pressure. The tricuspid regurgitant velocity is 3.63 m/s, and with an assumed right atrial pressure of 15 mmHg, the estimated right ventricular systolic pressure is 67.7 mmHg. Left Atrium: Left atrial size was moderately dilated. Right Atrium: Right atrial size was moderately dilated. Pericardium: A moderately sized pericardial effusion is present. The pericardial effusion is circumferential. There is no evidence of cardiac tamponade. Mitral Valve: The mitral valve is normal in structure. Trivial mitral valve regurgitation. No evidence of mitral valve stenosis. MV peak gradient, 2.7 mmHg. The mean mitral valve gradient is 1.0 mmHg. Tricuspid Valve: The tricuspid valve is normal in structure. Tricuspid valve regurgitation is mild . No evidence of tricuspid stenosis. Aortic Valve: The aortic valve is tricuspid. Aortic valve regurgitation is not visualized. No aortic stenosis is present. Aortic valve mean gradient measures 3.0 mmHg. Aortic valve peak gradient measures 6.9 mmHg. Aortic valve area, by VTI measures 3.36 cm. Pulmonic Valve: The pulmonic valve was normal in structure. Pulmonic valve regurgitation is mild. No evidence of pulmonic stenosis. Aorta: The aortic root is normal in size and structure. Venous: The inferior vena cava is dilated in size with less than 50% respiratory variability, suggesting right atrial pressure of 15 mmHg. IAS/Shunts: The interatrial  septum was not assessed.  LEFT VENTRICLE PLAX 2D LVIDd:         4.70 cm      Diastology LVIDs:         3.90 cm      LV e' medial:    3.57 cm/s LV PW:         1.10 cm      LV E/e' medial:  24.2 LV IVS:        1.40 cm      LV e' lateral:   5.18 cm/s LVOT diam:     2.30 cm      LV E/e' lateral: 16.7 LV SV:         96 LV SV Index:   43 LVOT Area:     4.15 cm  LV Volumes (MOD) LV vol d, MOD A2C: 126.0 ml LV vol d, MOD A4C: 118.0 ml LV vol s, MOD A2C: 80.9 ml LV vol s, MOD A4C: 67.4 ml LV SV MOD A2C:     45.1 ml LV SV MOD A4C:     118.0 ml LV SV MOD BP:      48.8 ml RIGHT VENTRICLE            IVC RV Basal diam:  4.80 cm    IVC diam: 2.70 cm RV S prime:     5.77 cm/s TAPSE (M-mode): 1.1 cm LEFT ATRIUM             Index        RIGHT ATRIUM           Index LA diam:  5.50 cm 2.48 cm/m   RA Area:     26.60 cm LA Vol (A2C):   97.0 ml 43.67 ml/m  RA Volume:   94.80 ml  42.68 ml/m LA Vol (A4C):   98.2 ml 44.21 ml/m LA Biplane Vol: 99.7 ml 44.89 ml/m  AORTIC VALVE AV Area (Vmax):    3.52 cm AV Area (Vmean):   3.56 cm AV Area (VTI):     3.36 cm AV Vmax:           131.00 cm/s AV Vmean:          82.850 cm/s AV VTI:            0.284 m AV Peak Grad:      6.9 mmHg AV Mean Grad:      3.0 mmHg LVOT Vmax:         111.00 cm/s LVOT Vmean:        71.050 cm/s LVOT VTI:          0.230 m LVOT/AV VTI ratio: 0.81  AORTA Ao Root diam: 3.30 cm Ao Asc diam:  3.60 cm MITRAL VALVE               TRICUSPID VALVE MV Area (PHT): 2.93 cm    TR Peak grad:   52.7 mmHg MV Area VTI:   3.57 cm    TR Vmax:        363.00 cm/s MV Peak grad:  2.7 mmHg MV Mean grad:  1.0 mmHg    SHUNTS MV Vmax:       0.83 m/s    Systemic VTI:  0.23 m MV Vmean:      45.6 cm/s   Systemic Diam: 2.30 cm MV Decel Time: 259 msec MV E velocity: 86.30 cm/s MV A velocity: 37.20 cm/s MV E/A ratio:  2.32 Weston Brass MD Electronically signed by Weston Brass MD Signature Date/Time: 02/05/2023/9:03:51 AM    Final (Updated)    DG Chest Port 1 View  Result Date:  02/05/2023 CLINICAL DATA:  Central line placement EXAM: PORTABLE CHEST 1 VIEW COMPARISON:  02/05/2023, 12:47 a.m. FINDINGS: Gross cardiomegaly. Diffuse bilateral interstitial pulmonary opacity. Interval placement right neck multi lumen vascular catheter, tip near the superior cavoatrial junction. Osseous structures unremarkable. IMPRESSION: 1. Interval placement right neck multi lumen vascular catheter, tip near the superior cavoatrial junction. 2. Gross cardiomegaly and diffuse bilateral interstitial pulmonary opacity, most consistent with edema. Electronically Signed   By: Jearld Lesch M.D.   On: 02/05/2023 07:49   DG Chest Portable 1 View  Result Date: 02/05/2023 CLINICAL DATA:  Weakness, shortness of breath EXAM: PORTABLE CHEST 1 VIEW COMPARISON:  02/21/2022 FINDINGS: Cardiomegaly, vascular congestion. Very low lung volumes. Diffuse bilateral airspace disease, right greater than left. No visible effusions or pneumothorax. No acute bony abnormality. IMPRESSION: Cardiomegaly with diffuse bilateral airspace disease, right greater than left. Favor edema although infection cannot be excluded. Electronically Signed   By: Charlett Nose M.D.   On: 02/05/2023 01:05   CT Renal Stone Study  Result Date: 02/04/2023 CLINICAL DATA:  Left lower quadrant pain, diarrhea, and abdominal distention. EXAM: CT ABDOMEN AND PELVIS WITHOUT CONTRAST TECHNIQUE: Multidetector CT imaging of the abdomen and pelvis was performed following the standard protocol without IV contrast. RADIATION DOSE REDUCTION: This exam was performed according to the departmental dose-optimization program which includes automated exposure control, adjustment of the mA and/or kV according to patient size and/or use of iterative reconstruction technique. COMPARISON:  09/07/2014. FINDINGS: Lower chest: The heart  is enlarged and there is a moderate pericardial effusion measuring up to 1.8 cm. There are small bilateral pleural effusions, greater on the right than  on the left. Atelectasis is present at the lung bases. Hepatobiliary: No focal liver abnormality is seen. No gallstones, gallbladder wall thickening, or biliary dilatation. Pancreas: Unremarkable. No pancreatic ductal dilatation or surrounding inflammatory changes. Spleen: Normal in size without focal abnormality. Adrenals/Urinary Tract: The adrenal glands are within normal limits. Hyperdense material is noted in the renal collecting system in the lower pole of the left kidney. No hydroureteronephrosis. Hyperdense material is noted in the urinary bladder on the left in the region of the ureterovesicular junction. Stomach/Bowel: Stomach is within normal limits. Appendix appears normal. No evidence of bowel wall thickening, distention, or inflammatory changes. No free air or pneumatosis. Vascular/Lymphatic: Aortic atherosclerosis. No obvious abdominal or pelvic lymphadenopathy. Examination is limited due to ascites. Reproductive: The prostate gland is enlarged. Other: Large ascites and severe anasarca. Musculoskeletal: Degenerative changes are present in the thoracolumbar spine. No acute osseous abnormality. IMPRESSION: 1. Hyperdense material in the collecting system in the lower pole of the left kidney, possible blood products versus calcification. A small amount hyperdense material is noted in the urinary bladder on the left near the UVJ, possible blood products. Correlation with urinalysis is recommended. 2. Cardiomegaly with moderate pericardial effusion. 3. Small bilateral pleural effusions with atelectasis at the lung bases. 4. Large ascites. 5. Severe anasarca. 6. Enlarged prostate gland. 7. Aortic atherosclerosis. Electronically Signed   By: Thornell Sartorius M.D.   On: 02/04/2023 23:05    Cardiac Studies      Patient Profile     52 y.o. male    Assessment & Plan    Acute on chronic renal failure: Patient presents with anuria and  volume overload.  He is now on CVVH.  Further plans per critical care  team and nephrology.  2.  Acute on chronic biventricular congestive heart failure: LVEF is mildly depressed at 40 to 45%.  He has moderate LVH.  He has grade 3 diastolic dysfunction.  His RV is moderately dilated and hypocontractile.  IVC is dilated.  Estimated PA pressure is 68. Moderate pericardial effusion,  no evidence of pericardial effusion   Cont supportive care by the critical care team.  Overall prognosis appears to be fairly poor.  3.  Encephalopathy: Further plans per the critical care team.  4.  Anemia       For questions or updates, please contact [facility] Please consult [email or web address] for contact info under        Signed, Kristeen Miss, MD  02/06/2023, 8:25 AM

## 2023-02-06 NOTE — Progress Notes (Signed)
Hypoglycemic Event  CBG: 60  Treatment: D50 25 mL (12.5 gm)  Symptoms: None  Follow-up CBG: Time:1356 CBG Result:94  Possible Reasons for Event: Inadequate meal intake  Comments/MD notified:Hypoglycemic protocol    , Derrill Center

## 2023-02-06 NOTE — Plan of Care (Signed)
  Problem: Coping: Goal: Ability to adjust to condition or change in health will improve Outcome: Progressing   Problem: Skin Integrity: Goal: Risk for impaired skin integrity will decrease Outcome: Progressing   Problem: Tissue Perfusion: Goal: Adequacy of tissue perfusion will improve Outcome: Progressing   Problem: Clinical Measurements: Goal: Respiratory complications will improve Outcome: Progressing

## 2023-02-06 NOTE — Progress Notes (Addendum)
eLink Physician-Brief Progress Note Patient Name: Todd Mccoy DOB: 05/13/1971 MRN: [id number]   Date of Service  02/06/2023  HPI/Events of Note  Patient reportedly agitated.  He does state attack.  Also calling staff inappropriate names.  eICU Interventions  Video exam done.  Patient sleeping.  Nurse reports that anytime he wakes up he is both verbally and physically aggressive.  Wrist restraints ordered for use if needed.   11:30 PM. Patient reportedly agitated and screaming.  Daily assessment done.  He was in obvious pain.  1 mg of hydromorphone given with good effect.  Continue to monitor.  Intervention Category Major Interventions: Delirium, psychosis, severe agitation - evaluation and management  Carilyn Goodpasture 02/06/2023, 10:32 PM

## 2023-02-06 NOTE — Inpatient Diabetes Management (Signed)
Inpatient Diabetes Program Recommendations  AACE/ADA: New Consensus Statement on Inpatient Glycemic Control (2015)  Target Ranges:  Prepandial:   less than 140 mg/dL      Peak postprandial:   less than 180 mg/dL (1-2 hours)      Critically ill patients:  140 - 180 mg/dL   Lab Results  Component Value Date   GLUCAP 71 02/06/2023   HGBA1C 6.9 10/27/2022    Review of Glycemic Control  Latest Reference Range & Units 02/05/23 09:31 02/05/23 12:32 02/05/23 15:46 02/05/23 19:39 02/05/23 23:43 02/06/23 03:20 02/06/23 07:47  Glucose-Capillary 70 - 99 mg/dL 409 (H) 811 (H) 94 914 (H) 114 (H) 77 71  (H): Data is abnormally high  Diabetes history: DM Outpatient Diabetes medications: Lantus 10 units QD Current orders for Inpatient glycemic control: Novolog 0-6 units Q4H  Received referral for diabetes discharge recommendations.  Has not required any basal or rapid insulins inpatient thus far.  Will continue to follow and make recommendations closer to discharge.    Will continue to follow while inpatient.  Thank you, Dulce Sellar, MSN, CDCES Diabetes Coordinator Inpatient Diabetes Program [phone] (team pager from 8a-5p)

## 2023-02-06 NOTE — Progress Notes (Addendum)
[facility] Progress Note   Subjective:   CRRT through day and overnight - I/Os 1.2 / 3950, net neg 2.5L.  Off pressors now. Agitated overnight and was on precedex.     Objective Vitals:   02/06/23 0700 02/06/23 0730 02/06/23 0800 02/06/23 1000  BP: (!) 141/90 (!) 146/91 134/81 134/88  Pulse: (!) 58 63 63 63  Resp: 13 13 13 13   Temp:   (!) 97.5 F (36.4 C)   TempSrc:   Axillary   SpO2: 97% 97% 96% 98%  Weight:      Height:       Physical Exam General: sedated on precedex Heart: RRR, no rub Lungs: dec BS bases, normal WOB on RA Abdomen: soft, nontender, mildly distended Extremities: 2+ hip edema, L BKA Dialysis Access: RIJ temp HD cath  Additional Objective Labs: Basic Metabolic Panel: Recent Labs  Lab 02/05/23 0451 02/05/23 1531 02/06/23 0323  NA 137 138 136  K 5.9* 5.3* 4.9  CL 114* 113* 110  CO2 11* 12* 16*  GLUCOSE 141* 109* 83  BUN 101* 90* 67*  CREATININE 8.81*  8.60* 7.08* 5.25*  CALCIUM 7.5* 7.5* 7.5*  PHOS 6.4* 4.6 3.4   Liver Function Tests: Recent Labs  Lab 02/04/23 2107 02/05/23 1531 02/06/23 0323  AST 11*  --   --   ALT 12  --   --   ALKPHOS 123  --   --   BILITOT 0.6  --   --   PROT 7.1  --   --   ALBUMIN 2.7* 2.5* 2.4*   Recent Labs  Lab 02/04/23 2107  LIPASE 46   CBC: Recent Labs  Lab 02/04/23 2107 02/05/23 0451  WBC 7.0 7.4  NEUTROABS 6.2  --   HGB 8.4* 8.1*  HCT 28.0* 27.4*  MCV 87.8 87.5  PLT 107* 112*   Blood Culture    Component Value Date/Time   SDES  02/04/2023 2126    BLOOD LEFT FOREARM Performed at [facility], [address], [place], [name] [address]    [facility]  02/04/2023 2126    Blood Culture adequate volume BOTTLES DRAWN AEROBIC AND ANAEROBIC Performed at [facility], [address] [place], [name] [address]    CULT  02/04/2023 2126    NO GROWTH 1 DAY Performed at [facility], [address], [place], [name] [address]    REPTSTATUS PENDING  02/04/2023 2126    Cardiac Enzymes: No results for input(s): "CKTOTAL", "CKMB", "CKMBINDEX", "TROPONINI" in the last 168 hours. CBG: Recent Labs  Lab 02/05/23 1546 02/05/23 1939 02/05/23 2343 02/06/23 0320 02/06/23 0747  GLUCAP 94 102* 114* 77 71   Iron Studies: No results for input(s): "IRON", "TIBC", "TRANSFERRIN", "FERRITIN" in the last 72 hours. @lablastinr3 @ Studies/Results: ECHOCARDIOGRAM COMPLETE  Result Date: 02/05/2023    ECHOCARDIOGRAM REPORT   Patient Name:   Todd Mccoy Date of Exam: 02/05/2023 Medical Rec #:  [id number]           Height:       70.0 in Accession #:    [id number]          Weight:       231.5 lb Date of Birth:  01-Nov-1970           BSA:          2.221 m Patient Age:    52 years            BP:  128/81 mmHg Patient Gender: M                   HR:           67 bpm. Exam Location:  Inpatient Procedure: 2D Echo, Cardiac Doppler and Color Doppler STAT ECHO Indications:     Dilated cardiomyopathy; pericardial effusion  History:         Patient has prior history of Echocardiogram examinations, most                  recent 01/23/2022. CHF, CKD and PAD, Signs/Symptoms:anasarca;                  Risk Factors:Dyslipidemia, Diabetes, Hypertension and Current                  Smoker. Shock, abscesses, sepsis, cellulitis.  Sonographer:     Wallie Char Referring Phys:  [id number] [name] Diagnosing Phys: Weston Brass MD  Sonographer Comments: Messaged MD @ 8:48am IMPRESSIONS  1. Left ventricular ejection fraction, by estimation, is 40 to 45%. The left ventricle has mildly decreased function. The left ventricle demonstrates global hypokinesis. There is moderate left ventricular hypertrophy. Left ventricular diastolic parameters are consistent with Grade III diastolic dysfunction (restrictive).  2. Right ventricular systolic function is moderately reduced. The right ventricular size is moderately enlarged. There is severely elevated pulmonary artery systolic  pressure. The estimated right ventricular systolic pressure is 67.7 mmHg.  3. Left atrial size was moderately dilated.  4. Right atrial size was moderately dilated.  5. Moderate pericardial effusion. The pericardial effusion is circumferential. There is no definite evidence of cardiac tamponade by echocardiogram (BP and HR noted above).  6. The mitral valve is normal in structure. Trivial mitral valve regurgitation. No evidence of mitral stenosis.  7. The aortic valve is tricuspid. Aortic valve regurgitation is not visualized. No aortic stenosis is present.  8. The inferior vena cava is dilated in size with <50% respiratory variability, suggesting right atrial pressure of 15 mmHg. FINDINGS  Left Ventricle: Left ventricular ejection fraction, by estimation, is 40 to 45%. The left ventricle has mildly decreased function. The left ventricle demonstrates global hypokinesis. The left ventricular internal cavity size was normal in size. There is  moderate left ventricular hypertrophy. Left ventricular diastolic parameters are consistent with Grade III diastolic dysfunction (restrictive). Right Ventricle: The right ventricular size is moderately enlarged. No increase in right ventricular wall thickness. Right ventricular systolic function is moderately reduced. There is severely elevated pulmonary artery systolic pressure. The tricuspid regurgitant velocity is 3.63 m/s, and with an assumed right atrial pressure of 15 mmHg, the estimated right ventricular systolic pressure is 67.7 mmHg. Left Atrium: Left atrial size was moderately dilated. Right Atrium: Right atrial size was moderately dilated. Pericardium: A moderately sized pericardial effusion is present. The pericardial effusion is circumferential. There is no evidence of cardiac tamponade. Mitral Valve: The mitral valve is normal in structure. Trivial mitral valve regurgitation. No evidence of mitral valve stenosis. MV peak gradient, 2.7 mmHg. The mean mitral valve  gradient is 1.0 mmHg. Tricuspid Valve: The tricuspid valve is normal in structure. Tricuspid valve regurgitation is mild . No evidence of tricuspid stenosis. Aortic Valve: The aortic valve is tricuspid. Aortic valve regurgitation is not visualized. No aortic stenosis is present. Aortic valve mean gradient measures 3.0 mmHg. Aortic valve peak gradient measures 6.9 mmHg. Aortic valve area, by VTI measures 3.36 cm. Pulmonic Valve: The pulmonic valve was normal in structure. Pulmonic  valve regurgitation is mild. No evidence of pulmonic stenosis. Aorta: The aortic root is normal in size and structure. Venous: The inferior vena cava is dilated in size with less than 50% respiratory variability, suggesting right atrial pressure of 15 mmHg. IAS/Shunts: The interatrial septum was not assessed.  LEFT VENTRICLE PLAX 2D LVIDd:         4.70 cm      Diastology LVIDs:         3.90 cm      LV e' medial:    3.57 cm/s LV PW:         1.10 cm      LV E/e' medial:  24.2 LV IVS:        1.40 cm      LV e' lateral:   5.18 cm/s LVOT diam:     2.30 cm      LV E/e' lateral: 16.7 LV SV:         96 LV SV Index:   43 LVOT Area:     4.15 cm  LV Volumes (MOD) LV vol d, MOD A2C: 126.0 ml LV vol d, MOD A4C: 118.0 ml LV vol s, MOD A2C: 80.9 ml LV vol s, MOD A4C: 67.4 ml LV SV MOD A2C:     45.1 ml LV SV MOD A4C:     118.0 ml LV SV MOD BP:      48.8 ml RIGHT VENTRICLE            IVC RV Basal diam:  4.80 cm    IVC diam: 2.70 cm RV S prime:     5.77 cm/s TAPSE (M-mode): 1.1 cm LEFT ATRIUM             Index        RIGHT ATRIUM           Index LA diam:        5.50 cm 2.48 cm/m   RA Area:     26.60 cm LA Vol (A2C):   97.0 ml 43.67 ml/m  RA Volume:   94.80 ml  42.68 ml/m LA Vol (A4C):   98.2 ml 44.21 ml/m LA Biplane Vol: 99.7 ml 44.89 ml/m  AORTIC VALVE AV Area (Vmax):    3.52 cm AV Area (Vmean):   3.56 cm AV Area (VTI):     3.36 cm AV Vmax:           131.00 cm/s AV Vmean:          82.850 cm/s AV VTI:            0.284 m AV Peak Grad:      6.9 mmHg  AV Mean Grad:      3.0 mmHg LVOT Vmax:         111.00 cm/s LVOT Vmean:        71.050 cm/s LVOT VTI:          0.230 m LVOT/AV VTI ratio: 0.81  AORTA Ao Root diam: 3.30 cm Ao Asc diam:  3.60 cm MITRAL VALVE               TRICUSPID VALVE MV Area (PHT): 2.93 cm    TR Peak grad:   52.7 mmHg MV Area VTI:   3.57 cm    TR Vmax:        363.00 cm/s MV Peak grad:  2.7 mmHg MV Mean grad:  1.0 mmHg    SHUNTS MV Vmax:       0.83 m/s  Systemic VTI:  0.23 m MV Vmean:      45.6 cm/s   Systemic Diam: 2.30 cm MV Decel Time: 259 msec MV E velocity: 86.30 cm/s MV A velocity: 37.20 cm/s MV E/A ratio:  2.32 Weston Brass MD Electronically signed by Weston Brass MD Signature Date/Time: 02/05/2023/9:03:51 AM    Final (Updated)    DG Chest Port 1 View  Result Date: 02/05/2023 CLINICAL DATA:  Central line placement EXAM: PORTABLE CHEST 1 VIEW COMPARISON:  02/05/2023, 12:47 a.m. FINDINGS: Gross cardiomegaly. Diffuse bilateral interstitial pulmonary opacity. Interval placement right neck multi lumen vascular catheter, tip near the superior cavoatrial junction. Osseous structures unremarkable. IMPRESSION: 1. Interval placement right neck multi lumen vascular catheter, tip near the superior cavoatrial junction. 2. Gross cardiomegaly and diffuse bilateral interstitial pulmonary opacity, most consistent with edema. Electronically Signed   By: Jearld Lesch M.D.   On: 02/05/2023 07:49   DG Chest Portable 1 View  Result Date: 02/05/2023 CLINICAL DATA:  Weakness, shortness of breath EXAM: PORTABLE CHEST 1 VIEW COMPARISON:  02/21/2022 FINDINGS: Cardiomegaly, vascular congestion. Very low lung volumes. Diffuse bilateral airspace disease, right greater than left. No visible effusions or pneumothorax. No acute bony abnormality. IMPRESSION: Cardiomegaly with diffuse bilateral airspace disease, right greater than left. Favor edema although infection cannot be excluded. Electronically Signed   By: Charlett Nose M.D.   On: 02/05/2023 01:05   CT  Renal Stone Study  Result Date: 02/04/2023 CLINICAL DATA:  Left lower quadrant pain, diarrhea, and abdominal distention. EXAM: CT ABDOMEN AND PELVIS WITHOUT CONTRAST TECHNIQUE: Multidetector CT imaging of the abdomen and pelvis was performed following the standard protocol without IV contrast. RADIATION DOSE REDUCTION: This exam was performed according to the departmental dose-optimization program which includes automated exposure control, adjustment of the mA and/or kV according to patient size and/or use of iterative reconstruction technique. COMPARISON:  09/07/2014. FINDINGS: Lower chest: The heart is enlarged and there is a moderate pericardial effusion measuring up to 1.8 cm. There are small bilateral pleural effusions, greater on the right than on the left. Atelectasis is present at the lung bases. Hepatobiliary: No focal liver abnormality is seen. No gallstones, gallbladder wall thickening, or biliary dilatation. Pancreas: Unremarkable. No pancreatic ductal dilatation or surrounding inflammatory changes. Spleen: Normal in size without focal abnormality. Adrenals/Urinary Tract: The adrenal glands are within normal limits. Hyperdense material is noted in the renal collecting system in the lower pole of the left kidney. No hydroureteronephrosis. Hyperdense material is noted in the urinary bladder on the left in the region of the ureterovesicular junction. Stomach/Bowel: Stomach is within normal limits. Appendix appears normal. No evidence of bowel wall thickening, distention, or inflammatory changes. No free air or pneumatosis. Vascular/Lymphatic: Aortic atherosclerosis. No obvious abdominal or pelvic lymphadenopathy. Examination is limited due to ascites. Reproductive: The prostate gland is enlarged. Other: Large ascites and severe anasarca. Musculoskeletal: Degenerative changes are present in the thoracolumbar spine. No acute osseous abnormality. IMPRESSION: 1. Hyperdense material in the collecting system in  the lower pole of the left kidney, possible blood products versus calcification. A small amount hyperdense material is noted in the urinary bladder on the left near the UVJ, possible blood products. Correlation with urinalysis is recommended. 2. Cardiomegaly with moderate pericardial effusion. 3. Small bilateral pleural effusions with atelectasis at the lung bases. 4. Large ascites. 5. Severe anasarca. 6. Enlarged prostate gland. 7. Aortic atherosclerosis. Electronically Signed   By: Thornell Sartorius M.D.   On: 02/04/2023 23:05   Medications:   prismasol  BGK 4/2.5 400 mL/hr at 02/06/23 0958    prismasol BGK 4/2.5 400 mL/hr at 02/06/23 0957   sodium chloride 20 mL/hr at 02/06/23 1000   cefTRIAXone (ROCEPHIN)  IV Stopped (02/06/23 0104)   dexmedetomidine (PRECEDEX) IV infusion 0.2 mcg/kg/hr (02/06/23 1001)   famotidine (PEPCID) IV Stopped (02/06/23 0548)   norepinephrine (LEVOPHED) Adult infusion Stopped (02/05/23 1246)   prismasol BGK 4/2.5 1,500 mL/hr at 02/06/23 7846    Chlorhexidine Gluconate Cloth  6 each Topical Daily   feeding supplement (NEPRO CARB STEADY)  237 mL Oral TID BM   heparin  5,000 Units Subcutaneous Q8H   insulin aspart  0-6 Units Subcutaneous Q4H   multivitamin  1 tablet Oral QHS   mupirocin ointment  1 Application Nasal BID   Assessment/Plan **Shock:  Improved, cultures neg. Off pressors.  On abx per PCCM.  **AMS:  very agitated yest requiring precedex -- when held he was again very agitated and trying to pull HD line.  Per primary.     **AKI on advanced CKD: Baseline CKD 5, had missed fu at [facility] since 08/2022.  Reported anuric x 24h prior to admission.  Suspect he's had progression of underlying CKD based in 10/2022 labs.  Started CRRT 8/3 for volume overload and acidosis.  OK to d/c CRRT and start HD -- transferring to [facility] today for that.   Discussed on admission with patient may be ESRD now but will see how things go over the next few days.   He would accept outpt HD if  necessary.  Phos ok, checking PTH with labs in AM.  If not recovering pt will need CLIP and perm access consult.   **HFrEF: 01/2022 EF 40%, severe RV dysfunction, pericardial effusion w/o tamponade.  Overloaded.  Cardiology following.     **Volume:  volume overloaded but on RA, has had UF with CRRT, more with HD this week.   **metabolic acidosis:  secondary to AKI/CKD, improving with RRT.    **Anemia:  suspect of CKD, no reported bleeding.  Check iron indices.  Likely will need to start ESA.     **Thrombocytopenia:  plt ~100, new finding.  Hold heparin with RRT.    Will follow, reach out with concerns.  Estill Bakes MD 02/06/2023, 10:47 AM  [facility] Pager: [phone]

## 2023-02-06 NOTE — Progress Notes (Signed)
eLink Physician-Brief Progress Note Patient Name: Todd Mccoy DOB: 1971-05-31 MRN: [id number]   Date of Service  02/06/2023  HPI/Events of Note  Agitated, disoriented and pulling on lines including reaching to HD cath. Mittens on. Hallucinating about shoes  Denied pain. Has itching  Qtc <500 ms   eICU Interventions  Precedex started     Intervention Category Minor Interventions: Agitation / anxiety - evaluation and management   Mechele Collin 02/06/2023, 12:28 AM

## 2023-02-07 DIAGNOSIS — N179 Acute kidney failure, unspecified: Secondary | ICD-10-CM | POA: Diagnosis not present

## 2023-02-07 DIAGNOSIS — I10 Essential (primary) hypertension: Secondary | ICD-10-CM

## 2023-02-07 DIAGNOSIS — N189 Chronic kidney disease, unspecified: Secondary | ICD-10-CM

## 2023-02-07 DIAGNOSIS — G9340 Encephalopathy, unspecified: Secondary | ICD-10-CM

## 2023-02-07 DIAGNOSIS — G9341 Metabolic encephalopathy: Secondary | ICD-10-CM

## 2023-02-07 DIAGNOSIS — I5082 Biventricular heart failure: Secondary | ICD-10-CM | POA: Diagnosis not present

## 2023-02-07 DIAGNOSIS — R579 Shock, unspecified: Secondary | ICD-10-CM | POA: Diagnosis not present

## 2023-02-07 LAB — RENAL FUNCTION PANEL
Albumin: 2.2 g/dL — ABNORMAL LOW (ref 3.5–5.0)
Anion gap: 13 (ref 5–15)
BUN: 59 mg/dL — ABNORMAL HIGH (ref 6–20)
CO2: 17 mmol/L — ABNORMAL LOW (ref 22–32)
Calcium: 7.5 mg/dL — ABNORMAL LOW (ref 8.9–10.3)
Chloride: 107 mmol/L (ref 98–111)
Creatinine, Ser: 4.86 mg/dL — ABNORMAL HIGH (ref 0.61–1.24)
GFR, Estimated: 14 mL/min — ABNORMAL LOW (ref >60)
Glucose, Bld: 81 mg/dL (ref 70–99)
Phosphorus: 3.5 mg/dL (ref 2.5–4.6)
Potassium: 4.9 mmol/L (ref 3.5–5.1)
Sodium: 137 mmol/L (ref 135–145)

## 2023-02-07 LAB — GLUCOSE, CAPILLARY
Glucose-Capillary: 104 mg/dL — ABNORMAL HIGH (ref 70–99)
Glucose-Capillary: 132 mg/dL — ABNORMAL HIGH (ref 70–99)
Glucose-Capillary: 65 mg/dL — ABNORMAL LOW (ref 70–99)
Glucose-Capillary: 66 mg/dL — ABNORMAL LOW (ref 70–99)
Glucose-Capillary: 84 mg/dL (ref 70–99)
Glucose-Capillary: 85 mg/dL (ref 70–99)
Glucose-Capillary: 87 mg/dL (ref 70–99)
Glucose-Capillary: 91 mg/dL (ref 70–99)
Glucose-Capillary: 97 mg/dL (ref 70–99)

## 2023-02-07 LAB — URINE CULTURE

## 2023-02-07 LAB — TECHNOLOGIST SMEAR REVIEW: Plt Morphology: NORMAL

## 2023-02-07 MED ORDER — ATORVASTATIN CALCIUM 10 MG PO TABS
20.0000 mg | ORAL_TABLET | Freq: Every day | ORAL | Status: DC
  Administered 2023-02-08 – 2023-02-14 (×7): 20 mg via ORAL
  Filled 2023-02-07 (×7): qty 2

## 2023-02-07 MED ORDER — FUROSEMIDE 10 MG/ML IJ SOLN
80.0000 mg | Freq: Two times a day (BID) | INTRAMUSCULAR | Status: DC
  Administered 2023-02-07 – 2023-02-12 (×12): 80 mg via INTRAVENOUS
  Filled 2023-02-07 (×12): qty 8

## 2023-02-07 MED ORDER — CLONIDINE HCL 0.2 MG PO TABS: 0.1000 mg | ORAL_TABLET | Freq: Three times a day (TID) | ORAL | Status: DC

## 2023-02-07 MED ORDER — ISOSORBIDE DINITRATE 10 MG PO TABS
10.0000 mg | ORAL_TABLET | Freq: Three times a day (TID) | ORAL | Status: DC
  Administered 2023-02-07 (×3): 10 mg via ORAL
  Filled 2023-02-07 (×4): qty 1

## 2023-02-07 MED ORDER — DEXTROSE 50 % IV SOLN: 12.5000 g | INTRAVENOUS | Status: AC

## 2023-02-07 MED ORDER — DEXTROSE 50 % IV SOLN
INTRAVENOUS | Status: AC
  Administered 2023-02-07: 12.5 g via INTRAVENOUS
  Filled 2023-02-07: qty 50

## 2023-02-07 MED ORDER — DARBEPOETIN ALFA 60 MCG/0.3ML IJ SOSY
60.0000 ug | PREFILLED_SYRINGE | INTRAMUSCULAR | Status: DC
  Filled 2023-02-07: qty 0.3

## 2023-02-07 MED ORDER — ATORVASTATIN CALCIUM 10 MG PO TABS: 20.0000 mg | ORAL_TABLET | Freq: Every day | ORAL | Status: DC

## 2023-02-07 MED ORDER — DEXTROSE 10 % IV SOLN: INTRAVENOUS | Status: AC

## 2023-02-07 MED ORDER — HYDRALAZINE HCL 10 MG PO TABS
10.0000 mg | ORAL_TABLET | Freq: Three times a day (TID) | ORAL | Status: DC
  Administered 2023-02-07 (×3): 10 mg via ORAL
  Filled 2023-02-07 (×4): qty 1

## 2023-02-07 NOTE — Progress Notes (Addendum)
   NAME:  Todd Mccoy, MRN:  [id number], DOB:  January 10, 1971, LOS: 2 ADMISSION DATE:  02/04/2023  History of Present Illness:  52 year old male with blindness, poorly controlled hypertension, HFrEF, type 2 diabetes, and CKD 5.  Presents with 3 days of diarrhea and decreased urine output, hypothermic in the ER, with AKI and anasarca.  Admitted to ICU for volume overload requiring CRRT and shock requiring brief infusion of norepinephrine.  Significant Hospital Events:  8/3 admitted to ICU for CRRT 8/4 transferred to [facility] for IHD, delirium and agitation, precedex continued  Interim History / Subjective:  Agitation and verbally abusive CBG to 60 and 1 amp of D50 No more diarrhea  Objective   Blood pressure (!) 167/93, pulse (!) 54, temperature (!) 96.5 F (35.8 C), temperature source Axillary, resp. rate 13, height 5\' 10"  (1.778 m), weight 99.4 kg, SpO2 99%.        Intake/Output Summary (Last 24 hours) at 02/07/2023 0725 Last data filed at 02/07/2023 0400 Gross per 24 hour  Intake 501.39 ml  Output 778 ml  Net -276.61 ml   Filed Weights   02/05/23 0545 02/06/23 0500 02/07/23 0355  Weight: 105 kg 101.2 kg 99.4 kg    Examination: No apparent distress Heart rate bradycardic, rhythm regular, peripheral pulses are good Breathing is regular and unlabored on room air, lung sounds decreased Abdomen soft and nontender Skin is warm and dry Alert to verbal, oriented to hospital  Labs: Electrolytes okay Bicarb 17 BUN/creatinine 59/4.86 WBC 7.8 Hemoglobin 9  Resolved Hospital Problem list   Resolved Problems:   * No resolved hospital problems. *   Assessment & Plan:  Principal Problem:   Shock circulatory (HCC)  AKI CKD 5 - Electrolytes look good today - Some small urine output yesterday - Appreciate nephrology assistance - Furosemide 80 mg every 12 in lieu of IHD today  Acute on chronic biventricular systolic heart failure Bilateral pleural effusions Pericardial  effusion without tamponade Shock, resolved - Furosemide 80 mg every 12 as above - Appreciate cardiology input  Acute metabolic encephalopathy  Delirium - Wean Precedex to off today - Clonidine considered but held given concern for bradycardia and rebound hypertension  Hypertension - Home meds were held given initial shock state - Hydralazine/isosorbide, increase as able  Anemia, chronic New thrombocytopenia - Iron deficiency - Would benefit from iron infusion once infection is more definitively ruled out - Smear review  Diabetes Hypoglycemia - Holding insulin - D10 at 30 mL/h  Diarrhea - No more since arrival to [facility] - DC GI pathogen panel  UTI - No growth on cultures - Urine with mixed flora, pyuria, hematuria, bacteriuria, nitrite negative - DC ceftriaxone - If he fevers, check cultures prior to starting abx  Best practice (daily eval):  Diet/type: Regular consistency (see orders) DVT prophylaxis: prophylactic heparin  GI prophylaxis: H2B Lines: Dialysis Catheter Foley:  Yes, and it is still needed Code Status:  full code  Marrianne Mood MD 02/07/2023, 7:25 AM  Pager: [phone]

## 2023-02-07 NOTE — Progress Notes (Signed)
[facility] Progress Note   Subjective:   CRRT stopped 8/4 and pt transferred to [facility] ICU-  BP now high-  he has been agitated-  248 of UOP-  2 episodes of hypoglycemia and he is sedated   Objective Vitals:   02/07/23 0355 02/07/23 0400 02/07/23 0430 02/07/23 0500  BP:  (!) 162/89 (!) 168/95 (!) 167/93  Pulse:  (!) 55 (!) 55 (!) 54  Resp:  14 12 13   Temp:      TempSrc:      SpO2:  100% 99% 99%  Weight: 99.4 kg     Height:       Physical Exam General: sedated on precedex Heart: RRR, no rub Lungs: dec BS bases, normal WOB on RA Abdomen: soft, nontender, mildly distended Extremities: 1+  edema, L BKA Dialysis Access: RIJ temp HD cath placed 8/3  Additional Objective Labs: Basic Metabolic Panel: Recent Labs  Lab 02/06/23 0323 02/06/23 1700 02/07/23 0202  NA 136 139 137  K 4.9 4.7 4.9  CL 110 109 107  CO2 16* 20* 17*  GLUCOSE 83 82 81  BUN 67* 57* 59*  CREATININE 5.25* 5.08* 4.86*  CALCIUM 7.5* 7.5* 7.5*  PHOS 3.4 3.1 3.5   Liver Function Tests: Recent Labs  Lab 02/04/23 2107 02/05/23 1531 02/06/23 0323 02/06/23 1700 02/07/23 0202  AST 11*  --   --   --   --   ALT 12  --   --   --   --   ALKPHOS 123  --   --   --   --   BILITOT 0.6  --   --   --   --   PROT 7.1  --   --   --   --   ALBUMIN 2.7*   < > 2.4* 2.1* 2.2*   < > = values in this interval not displayed.   Recent Labs  Lab 02/04/23 2107  LIPASE 46   CBC: Recent Labs  Lab 02/04/23 2107 02/05/23 0451 02/07/23 0202  WBC 7.0 7.4 7.8  NEUTROABS 6.2  --   --   HGB 8.4* 8.1* 9.0*  HCT 28.0* 27.4* 29.3*  MCV 87.8 87.5 84.7  PLT 107* 112* PLATELET CLUMPS NOTED ON SMEAR, UNABLE TO ESTIMATE   Blood Culture    Component Value Date/Time   SDES  02/04/2023 2126    BLOOD LEFT FOREARM Performed at [facility], [address] [place], [name] [address]    [facility]  02/04/2023 2126    Blood Culture adequate volume BOTTLES DRAWN AEROBIC AND ANAEROBIC Performed at  [facility], [address], [place], [name] [address]    CULT  02/04/2023 2126    NO GROWTH 1 DAY Performed at [facility], [address] [place], [name] [address]    REPTSTATUS PENDING 02/04/2023 2126    Cardiac Enzymes: No results for input(s): "CKTOTAL", "CKMB", "CKMBINDEX", "TROPONINI" in the last 168 hours. CBG: Recent Labs  Lab 02/06/23 1944 02/06/23 2330 02/07/23 0047 02/07/23 0342 02/07/23 0510  GLUCAP 70 68* 91 65* 84   Iron Studies:  Recent Labs    02/07/23 0202  IRON 13*  TIBC 197*   @[email or web address] @ Studies/Results: ECHOCARDIOGRAM COMPLETE  Result Date: 02/05/2023    ECHOCARDIOGRAM REPORT   Patient Name:   EVALD VALASEK Date of Exam: 02/05/2023 Medical Rec #:  [id number]           Height:  70.0 in Accession #:    [id number]          Weight:       231.5 lb Date of Birth:  [birth year]           BSA:          2.221 m Patient Age:    52 years            BP:           128/81 mmHg Patient Gender: M                   HR:           67 bpm. Exam Location:  Inpatient Procedure: 2D Echo, Cardiac Doppler and Color Doppler STAT ECHO Indications:     Dilated cardiomyopathy; pericardial effusion  History:         Patient has prior history of Echocardiogram examinations, most                  recent 01/23/2022. CHF, CKD and PAD, Signs/Symptoms:anasarca;                  Risk Factors:Dyslipidemia, Diabetes, Hypertension and Current                  Smoker. Shock, abscesses, sepsis, cellulitis.  Sonographer:     Wallie Char Referring Phys:  [id number] [name] Diagnosing Phys: Weston Brass MD  Sonographer Comments: Messaged MD @ 8:48am IMPRESSIONS  1. Left ventricular ejection fraction, by estimation, is 40 to 45%. The left ventricle has mildly decreased function. The left ventricle demonstrates global hypokinesis. There is moderate left ventricular hypertrophy. Left ventricular diastolic parameters are consistent with Grade III diastolic  dysfunction (restrictive).  2. Right ventricular systolic function is moderately reduced. The right ventricular size is moderately enlarged. There is severely elevated pulmonary artery systolic pressure. The estimated right ventricular systolic pressure is 67.7 mmHg.  3. Left atrial size was moderately dilated.  4. Right atrial size was moderately dilated.  5. Moderate pericardial effusion. The pericardial effusion is circumferential. There is no definite evidence of cardiac tamponade by echocardiogram (BP and HR noted above).  6. The mitral valve is normal in structure. Trivial mitral valve regurgitation. No evidence of mitral stenosis.  7. The aortic valve is tricuspid. Aortic valve regurgitation is not visualized. No aortic stenosis is present.  8. The inferior vena cava is dilated in size with <50% respiratory variability, suggesting right atrial pressure of 15 mmHg. FINDINGS  Left Ventricle: Left ventricular ejection fraction, by estimation, is 40 to 45%. The left ventricle has mildly decreased function. The left ventricle demonstrates global hypokinesis. The left ventricular internal cavity size was normal in size. There is  moderate left ventricular hypertrophy. Left ventricular diastolic parameters are consistent with Grade III diastolic dysfunction (restrictive). Right Ventricle: The right ventricular size is moderately enlarged. No increase in right ventricular wall thickness. Right ventricular systolic function is moderately reduced. There is severely elevated pulmonary artery systolic pressure. The tricuspid regurgitant velocity is 3.63 m/s, and with an assumed right atrial pressure of 15 mmHg, the estimated right ventricular systolic pressure is 67.7 mmHg. Left Atrium: Left atrial size was moderately dilated. Right Atrium: Right atrial size was moderately dilated. Pericardium: A moderately sized pericardial effusion is present. The pericardial effusion is circumferential. There is no evidence of cardiac  tamponade. Mitral Valve: The mitral valve is normal in structure. Trivial mitral valve regurgitation. No evidence of mitral valve stenosis. MV  peak gradient, 2.7 mmHg. The mean mitral valve gradient is 1.0 mmHg. Tricuspid Valve: The tricuspid valve is normal in structure. Tricuspid valve regurgitation is mild . No evidence of tricuspid stenosis. Aortic Valve: The aortic valve is tricuspid. Aortic valve regurgitation is not visualized. No aortic stenosis is present. Aortic valve mean gradient measures 3.0 mmHg. Aortic valve peak gradient measures 6.9 mmHg. Aortic valve area, by VTI measures 3.36 cm. Pulmonic Valve: The pulmonic valve was normal in structure. Pulmonic valve regurgitation is mild. No evidence of pulmonic stenosis. Aorta: The aortic root is normal in size and structure. Venous: The inferior vena cava is dilated in size with less than 50% respiratory variability, suggesting right atrial pressure of 15 mmHg. IAS/Shunts: The interatrial septum was not assessed.  LEFT VENTRICLE PLAX 2D LVIDd:         4.70 cm      Diastology LVIDs:         3.90 cm      LV e' medial:    3.57 cm/s LV PW:         1.10 cm      LV E/e' medial:  24.2 LV IVS:        1.40 cm      LV e' lateral:   5.18 cm/s LVOT diam:     2.30 cm      LV E/e' lateral: 16.7 LV SV:         96 LV SV Index:   43 LVOT Area:     4.15 cm  LV Volumes (MOD) LV vol d, MOD A2C: 126.0 ml LV vol d, MOD A4C: 118.0 ml LV vol s, MOD A2C: 80.9 ml LV vol s, MOD A4C: 67.4 ml LV SV MOD A2C:     45.1 ml LV SV MOD A4C:     118.0 ml LV SV MOD BP:      48.8 ml RIGHT VENTRICLE            IVC RV Basal diam:  4.80 cm    IVC diam: 2.70 cm RV S prime:     5.77 cm/s TAPSE (M-mode): 1.1 cm LEFT ATRIUM             Index        RIGHT ATRIUM           Index LA diam:        5.50 cm 2.48 cm/m   RA Area:     26.60 cm LA Vol (A2C):   97.0 ml 43.67 ml/m  RA Volume:   94.80 ml  42.68 ml/m LA Vol (A4C):   98.2 ml 44.21 ml/m LA Biplane Vol: 99.7 ml 44.89 ml/m  AORTIC VALVE AV Area  (Vmax):    3.52 cm AV Area (Vmean):   3.56 cm AV Area (VTI):     3.36 cm AV Vmax:           131.00 cm/s AV Vmean:          82.850 cm/s AV VTI:            0.284 m AV Peak Grad:      6.9 mmHg AV Mean Grad:      3.0 mmHg LVOT Vmax:         111.00 cm/s LVOT Vmean:        71.050 cm/s LVOT VTI:          0.230 m LVOT/AV VTI ratio: 0.81  AORTA Ao Root diam: 3.30 cm Ao Asc diam:  3.60 cm MITRAL VALVE               TRICUSPID VALVE MV Area (PHT): 2.93 cm    TR Peak grad:   52.7 mmHg MV Area VTI:   3.57 cm    TR Vmax:        363.00 cm/s MV Peak grad:  2.7 mmHg MV Mean grad:  1.0 mmHg    SHUNTS MV Vmax:       0.83 m/s    Systemic VTI:  0.23 m MV Vmean:      45.6 cm/s   Systemic Diam: 2.30 cm MV Decel Time: 259 msec MV E velocity: 86.30 cm/s MV A velocity: 37.20 cm/s MV E/A ratio:  2.32 Weston Brass MD Electronically signed by Weston Brass MD Signature Date/Time: 02/05/2023/9:03:51 AM    Final (Updated)    Medications:   prismasol BGK 4/2.5 400 mL/hr at 02/06/23 0958    prismasol BGK 4/2.5 400 mL/hr at 02/06/23 0957   sodium chloride 5 mL/hr at 02/07/23 0400   cefTRIAXone (ROCEPHIN)  IV Stopped (02/07/23 0227)   dexmedetomidine (PRECEDEX) IV infusion 0.6 mcg/kg/hr (02/07/23 0400)   famotidine (PEPCID) IV 20 mg (02/07/23 0515)   prismasol BGK 4/2.5 1,500 mL/hr at 02/06/23 5409    Chlorhexidine Gluconate Cloth  6 each Topical Daily   Chlorhexidine Gluconate Cloth  6 each Topical Q0600   feeding supplement (NEPRO CARB STEADY)  237 mL Oral TID BM   heparin  5,000 Units Subcutaneous Q8H   multivitamin  1 tablet Oral QHS   mupirocin ointment  1 Application Nasal BID   Assessment/Plan **Shock:  Improved, cultures neg. Off pressors.  On abx per PCCM, rocephin.  **AMS:  very agitated yest requiring precedex -- when held he was again very agitated and trying to pull HD line.  Per primary.     **AKI on advanced CKD: Baseline CKD 5, had missed fu at [facility] since 08/2022.  Reported anuric x 24h prior to admission.   Suspect he's had progression of underlying CKD based in 10/2022 labs.  Started CRRT 8/3 for volume overload and acidosis.   d/c CRRT on 8/4 and plan to  start HD -- transferred to [facility] today for that.  patient may be ESRD now but will see how things go over the next few days.   He would accept outpt HD if necessary.  Phos ok, checking PTH.  If not recovering pt will need CLIP and perm access consult.  Making a little urine and crt trending down ? Off crrt.  Am going to hold HD today -  reschedule for tomorrow   **HFrEF: 01/2022 EF 40%, severe RV dysfunction, pericardial effusion w/o tamponade.  Overloaded.  Cardiology following.  Will challenge with some lasix   **Volume:  volume overloaded but on RA, has had UF with CRRT, challenge with lasix   **metabolic acidosis:  secondary to AKI/CKD, improved with RRT.    **Anemia:  suspect of CKD, no reported bleeding.  Check iron indices.  start ESA.-  hold on iron right now given infection    **Thrombocytopenia:  plt ~100, new finding.  Hold heparin with RRT.   Hypoglycemia-  started d10 low dose infusion      Cecille Aver  02/07/2023, 7:40 AM  [employer]

## 2023-02-07 NOTE — Progress Notes (Signed)
Rounding Note    Patient Name: Todd Mccoy Date of Encounter: 02/07/2023  [facility] Cardiologist: Armanda Magic, MD   Subjective   On precedex, not answering questions this AM. Per bedside nurse, some agitation overnight, weaning precedex, planned for HD tomorrow.  Inpatient Medications    Scheduled Meds:  Chlorhexidine Gluconate Cloth  6 each Topical Daily   Chlorhexidine Gluconate Cloth  6 each Topical Q0600   darbepoetin (ARANESP) injection - NON-DIALYSIS  60 mcg Subcutaneous Q Mon-1800   feeding supplement (NEPRO CARB STEADY)  237 mL Oral TID BM   furosemide  80 mg Intravenous Q12H   heparin  5,000 Units Subcutaneous Q8H   hydrALAZINE  10 mg Oral TID   isosorbide dinitrate  10 mg Oral TID   multivitamin  1 tablet Oral QHS   mupirocin ointment  1 Application Nasal BID   Continuous Infusions:  sodium chloride 5 mL/hr at 02/07/23 0400   cefTRIAXone (ROCEPHIN)  IV Stopped (02/07/23 0227)   dexmedetomidine (PRECEDEX) IV infusion 0.6 mcg/kg/hr (02/07/23 0400)   dextrose 30 mL/hr at 02/07/23 0802   famotidine (PEPCID) IV 20 mg (02/07/23 0515)   PRN Meds: docusate sodium, heparin, hydrOXYzine, mouth rinse, polyethylene glycol, sodium chloride   Vital Signs    Vitals:   02/07/23 0430 02/07/23 0500 02/07/23 0700 02/07/23 0730  BP: (!) 168/95 (!) 167/93 (!) 180/101 (!) 178/102  Pulse: (!) 55 (!) 54 (!) 56 (!) 54  Resp: 12 13 19 12   Temp:      TempSrc:      SpO2: 99% 99% 97% 99%  Weight:      Height:        Intake/Output Summary (Last 24 hours) at 02/07/2023 1011 Last data filed at 02/07/2023 0400 Gross per 24 hour  Intake 436.21 ml  Output 273 ml  Net 163.21 ml      02/07/2023    3:55 AM 02/06/2023    5:00 AM 02/05/2023    5:45 AM  Last 3 Weights  Weight (lbs) 219 lb 2.2 oz 223 lb 1.7 oz 231 lb 7.7 oz  Weight (kg) 99.4 kg 101.2 kg 105 kg      Telemetry    Sinus bradycardia - Personally Reviewed  ECG    No new - Personally  Reviewed  Physical Exam   GEN: No acute distress. Neck: No JVD Cardiac: RRR, no murmurs, rubs, or gallops.  Respiratory: Clear to auscultation bilaterally. GI: Soft, nontender, non-distended  MS: No edema; L BKA Neuro:  Nonfocal, not answering questions on precedex  Labs    High Sensitivity Troponin:  No results for input(s): "TROPONINIHS" in the last 720 hours.   Chemistry Recent Labs  Lab 02/04/23 2107 02/05/23 0451 02/05/23 1531 02/06/23 0323 02/06/23 1700 02/07/23 0202  NA 137 137   < > 136 139 137  K 5.4* 5.9*   < > 4.9 4.7 4.9  CL 113* 114*   < > 110 109 107  CO2 11* 11*   < > 16* 20* 17*  GLUCOSE 138* 141*   < > 83 82 81  BUN 107* 101*   < > 67* 57* 59*  CREATININE 8.86* 8.81*  8.60*   < > 5.25* 5.08* 4.86*  CALCIUM 7.7* 7.5*   < > 7.5* 7.5* 7.5*  MG  --  1.8  1.7  --  1.9  --  2.0  PROT 7.1  --   --   --   --   --  ALBUMIN 2.7*  --    < > 2.4* 2.1* 2.2*  AST 11*  --   --   --   --   --   ALT 12  --   --   --   --   --   ALKPHOS 123  --   --   --   --   --   BILITOT 0.6  --   --   --   --   --   GFRNONAA 7* 7*  7*   < > 13* 13* 14*  ANIONGAP 13 12   < > 10 10 13    < > = values in this interval not displayed.    Lipids No results for input(s): "CHOL", "TRIG", "HDL", "LABVLDL", "LDLCALC", "CHOLHDL" in the last 168 hours.  Hematology Recent Labs  Lab 02/04/23 2107 02/05/23 0451 02/07/23 0202  WBC 7.0 7.4 7.8  RBC 3.19* 3.13* 3.46*  HGB 8.4* 8.1* 9.0*  HCT 28.0* 27.4* 29.3*  MCV 87.8 87.5 84.7  MCH 26.3 25.9* 26.0  MCHC 30.0 29.6* 30.7  RDW 23.1* 23.2* 23.1*  PLT 107* 112* PLATELET CLUMPS NOTED ON SMEAR, UNABLE TO ESTIMATE   Thyroid No results for input(s): "TSH", "FREET4" in the last 168 hours.  BNP Recent Labs  Lab 02/04/23 2107 02/05/23 0451  BNP 2,740.2* 2,575.0*    DDimer No results for input(s): "DDIMER" in the last 168 hours.   Radiology    No results found.  Cardiac Studies   Echo from 02/05/23 reviewed below  Patient Profile      52 y.o. male with acute on chronic renal failure and acute on chronic biventricular heart failure, with type II diabetes, PAD, hypertension  Assessment & Plan    Acute on chronic renal failure -now on HD, followed by nephrology  Acute on chronic biventricular failure -has required pressors this admission, now hypertensive -EF 40-45%, grade 3 diastolic dysfunction -volume management per nephrology -no ACEI/ARB/ARNI/MRA given renal disease -baseline bradycardia prevents use of beta blocker -would use hydralazine/isosorbide over clonidine, ordered today. Can uptitrate as able. If clonidine needed for agitation, would be cautious both for bradycardia and rebound hypertension  Sepsis Metabolic encephalopathy Anemia of chronic disease Type II diabetes History of osteomyelitis s/p L BKA -per primary team  For questions or updates, please contact [facility] Please consult [email or web address] for contact info under        Signed, Jodelle Red, MD  02/07/2023, 10:11 AM

## 2023-02-07 NOTE — Progress Notes (Signed)
Heart Failure Navigator Progress Note  Assessed for Heart & Vascular TOC clinic readiness.  Patient does not meet criteria due to Advanced Heart Failure Team patient.   Navigator will sign off at this time.     , [name], RN Heart Failure Nurse Navigator Secure Chat Only   

## 2023-02-08 DIAGNOSIS — D638 Anemia in other chronic diseases classified elsewhere: Secondary | ICD-10-CM

## 2023-02-08 DIAGNOSIS — R579 Shock, unspecified: Secondary | ICD-10-CM | POA: Diagnosis not present

## 2023-02-08 DIAGNOSIS — I5082 Biventricular heart failure: Secondary | ICD-10-CM | POA: Diagnosis not present

## 2023-02-08 DIAGNOSIS — E872 Acidosis, unspecified: Secondary | ICD-10-CM

## 2023-02-08 DIAGNOSIS — N185 Chronic kidney disease, stage 5: Secondary | ICD-10-CM | POA: Diagnosis not present

## 2023-02-08 DIAGNOSIS — N189 Chronic kidney disease, unspecified: Secondary | ICD-10-CM

## 2023-02-08 DIAGNOSIS — E1122 Type 2 diabetes mellitus with diabetic chronic kidney disease: Secondary | ICD-10-CM

## 2023-02-08 DIAGNOSIS — G9340 Encephalopathy, unspecified: Secondary | ICD-10-CM | POA: Diagnosis not present

## 2023-02-08 DIAGNOSIS — R197 Diarrhea, unspecified: Secondary | ICD-10-CM | POA: Diagnosis not present

## 2023-02-08 DIAGNOSIS — N179 Acute kidney failure, unspecified: Secondary | ICD-10-CM | POA: Diagnosis not present

## 2023-02-08 DIAGNOSIS — I5023 Acute on chronic systolic (congestive) heart failure: Secondary | ICD-10-CM

## 2023-02-08 LAB — GLUCOSE, CAPILLARY
Glucose-Capillary: 87 mg/dL (ref 70–99)
Glucose-Capillary: 122 mg/dL — ABNORMAL HIGH (ref 70–99)
Glucose-Capillary: 81 mg/dL (ref 70–99)
Glucose-Capillary: 122 mg/dL — ABNORMAL HIGH (ref 70–99)
Glucose-Capillary: 138 mg/dL — ABNORMAL HIGH (ref 70–99)

## 2023-02-08 MED ORDER — ISOSORBIDE DINITRATE 20 MG PO TABS: 20.0000 mg | ORAL_TABLET | Freq: Three times a day (TID) | ORAL | Status: DC

## 2023-02-08 MED ORDER — ACETAMINOPHEN 325 MG PO TABS
650.0000 mg | ORAL_TABLET | Freq: Four times a day (QID) | ORAL | Status: DC | PRN
  Administered 2023-02-08 – 2023-02-14 (×7): 650 mg via ORAL
  Filled 2023-02-08 (×8): qty 2

## 2023-02-08 MED ORDER — SODIUM CHLORIDE 0.9% FLUSH
10.0000 mL | Freq: Two times a day (BID) | INTRAVENOUS | Status: DC
  Administered 2023-02-08 – 2023-02-14 (×10): 10 mL

## 2023-02-08 MED ORDER — ISOSORBIDE DINITRATE 10 MG PO TABS
10.0000 mg | ORAL_TABLET | Freq: Three times a day (TID) | ORAL | Status: DC
  Administered 2023-02-08: 10 mg via ORAL
  Filled 2023-02-08: qty 1

## 2023-02-08 MED ORDER — SODIUM CHLORIDE 0.9% FLUSH: 10.0000 mL | INTRAVENOUS | Status: DC | PRN

## 2023-02-08 MED ORDER — SODIUM CHLORIDE 0.9 % IV SOLN
250.0000 mg | Freq: Every day | INTRAVENOUS | Status: AC
  Administered 2023-02-08 – 2023-02-11 (×4): 250 mg via INTRAVENOUS
  Filled 2023-02-08 (×5): qty 20

## 2023-02-08 MED ORDER — ISOSORBIDE MONONITRATE ER 30 MG PO TB24
30.0000 mg | ORAL_TABLET | Freq: Every day | ORAL | Status: DC
  Administered 2023-02-08 – 2023-02-12 (×5): 30 mg via ORAL
  Filled 2023-02-08 (×5): qty 1

## 2023-02-08 MED ORDER — HYDRALAZINE HCL 25 MG PO TABS
25.0000 mg | ORAL_TABLET | Freq: Three times a day (TID) | ORAL | Status: DC
  Administered 2023-02-08 – 2023-02-09 (×6): 25 mg via ORAL
  Filled 2023-02-08 (×6): qty 1

## 2023-02-08 NOTE — Progress Notes (Signed)
Patient has arrived to the floor  

## 2023-02-08 NOTE — Progress Notes (Signed)
Pt has had 4x type 7 stools, bowel incontinent. Pt refused rectal pouch placement. "It is easier to just go in the bed I do not want to sit on the pouch all day long"

## 2023-02-08 NOTE — Progress Notes (Signed)
Rounding Note    Patient Name: Todd Mccoy Date of Encounter: 02/08/2023  [facility] Cardiologist: Armanda Magic, MD   Subjective   Transferred to the floor this AM. Planned for HD today.   Inpatient Medications    Scheduled Meds:  atorvastatin  20 mg Oral Daily   Chlorhexidine Gluconate Cloth  6 each Topical Q0600   darbepoetin (ARANESP) injection - NON-DIALYSIS  60 mcg Subcutaneous Q Mon-1800   feeding supplement (NEPRO CARB STEADY)  237 mL Oral TID BM   furosemide  80 mg Intravenous Q12H   heparin  5,000 Units Subcutaneous Q8H   hydrALAZINE  25 mg Oral TID   isosorbide dinitrate  10 mg Oral TID   multivitamin  1 tablet Oral QHS   mupirocin ointment  1 Application Nasal BID   Continuous Infusions:  sodium chloride Stopped (02/07/23 0555)   ferric gluconate (FERRLECIT) IVPB     PRN Meds: docusate sodium, heparin, hydrOXYzine, mouth rinse, polyethylene glycol, sodium chloride   Vital Signs    Vitals:   02/08/23 0830 02/08/23 0900 02/08/23 0954 02/08/23 1036  BP: (!) 193/98 (!) 162/88 (!) 159/87 (!) 151/83  Pulse: 91 85  82  Resp: 18 16  19   Temp:    98 F (36.7 C)  TempSrc:    Oral  SpO2: 100% 100%  100%  Weight:      Height:        Intake/Output Summary (Last 24 hours) at 02/08/2023 1039 Last data filed at 02/08/2023 0400 Gross per 24 hour  Intake 181.73 ml  Output 1725 ml  Net -1543.27 ml      02/08/2023    4:11 AM 02/07/2023    3:55 AM 02/06/2023    5:00 AM  Last 3 Weights  Weight (lbs) 221 lb 1.9 oz 219 lb 2.2 oz 223 lb 1.7 oz  Weight (kg) 100.3 kg 99.4 kg 101.2 kg      Telemetry    Not on telemetry - Personally Reviewed  ECG    No new - Personally Reviewed  Physical Exam   GEN: Well nourished, well developed in no acute distress NECK: internal jugular HD cathether CARDIAC: regular rhythm, normal S1 and S2, no rubs or gallops. No murmur. VASCULAR: Radial pulses 2+ bilaterally.  RESPIRATORY:  Clear to auscultation without  rales, wheezing or rhonchi  ABDOMEN: Soft, non-tender, non-distended MUSCULOSKELETAL:  L BKA SKIN: Warm and dry, mild RLE edema NEUROLOGIC:  No focal neuro deficits noted. PSYCHIATRIC:  Normal affect    Labs    High Sensitivity Troponin:  No results for input(s): "TROPONINIHS" in the last 720 hours.   Chemistry Recent Labs  Lab 02/04/23 2107 02/05/23 0451 02/06/23 0323 02/06/23 1700 02/07/23 0202 02/07/23 1651 02/08/23 0119  NA 137   < > 136   < > 137 137 138  K 5.4*   < > 4.9   < > 4.9 4.5 4.7  CL 113*   < > 110   < > 107 108 107  CO2 11*   < > 16*   < > 17* 17* 18*  GLUCOSE 138*   < > 83   < > 81 94 95  BUN 107*   < > 67*   < > 59* 61* 62*  CREATININE 8.86*   < > 5.25*   < > 4.86* 5.27* 5.30*  CALCIUM 7.7*   < > 7.5*   < > 7.5* 7.5* 7.6*  MG  --    < >  1.9  --  2.0  --  1.9  PROT 7.1  --   --   --   --   --   --   ALBUMIN 2.7*   < > 2.4*   < > 2.2* 2.1* 2.0*  AST 11*  --   --   --   --   --   --   ALT 12  --   --   --   --   --   --   ALKPHOS 123  --   --   --   --   --   --   BILITOT 0.6  --   --   --   --   --   --   GFRNONAA 7*   < > 13*   < > 14* 12* 12*  ANIONGAP 13   < > 10   < > 13 12 13    < > = values in this interval not displayed.    Lipids No results for input(s): "CHOL", "TRIG", "HDL", "LABVLDL", "LDLCALC", "CHOLHDL" in the last 168 hours.  Hematology Recent Labs  Lab 02/05/23 0451 02/07/23 0202 02/08/23 0119  WBC 7.4 7.8 8.3  RBC 3.13* 3.46* 3.00*  HGB 8.1* 9.0* 7.7*  HCT 27.4* 29.3* 25.2*  MCV 87.5 84.7 84.0  MCH 25.9* 26.0 25.7*  MCHC 29.6* 30.7 30.6  RDW 23.2* 23.1* 22.5*  PLT 112* PLATELET CLUMPS NOTED ON SMEAR, UNABLE TO ESTIMATE 99*   Thyroid No results for input(s): "TSH", "FREET4" in the last 168 hours.  BNP Recent Labs  Lab 02/04/23 2107 02/05/23 0451  BNP 2,740.2* 2,575.0*    DDimer No results for input(s): "DDIMER" in the last 168 hours.   Radiology    No results found.  Cardiac Studies   Echo from 02/05/23 reviewed  below  Patient Profile     52 y.o. male with acute on chronic renal failure and acute on chronic biventricular heart failure, with type II diabetes, PAD, hypertension  Assessment & Plan    Acute on chronic renal failure -followed by nephrology, required CRRT this admission. Has been making urine as well, planned for HD today but held given urine output, may need HD tomorrow  Acute on chronic biventricular failure -has required pressors this admission, now hypertensive -EF 40-45%, grade 3 diastolic dysfunction -volume management per nephrology -no ACEI/ARB/ARNI/MRA given renal disease -baseline bradycardia prevents use of beta blocker -would use hydralazine/isosorbide, uptitrate as able. Will change to long acting isosorbide for ease of administration -cath deferred this admission given progression of renal disease. High suspicion that marked volume overload may be etiology of failure, recommend rechecking echo in 3 mos. Given that it is unclear whether he will need long term HD at this time, will avoid contrast  Sepsis Metabolic encephalopathy Anemia of chronic disease Type II diabetes History of osteomyelitis s/p L BKA -per primary team  [facility] will sign off.   Medication Recommendations:  Continue atorvastatin, hydralazine, isosorbide. Diuretics per nephrology. Carvedilol stopped due to bradycardia, do not continue at discharge Other recommendations (labs, testing, etc):  repeat echo in 3 mos as an outpatient Follow up as an outpatient:  Has appt already scheduled with Dr. Mayford Knife on 03/10/23   For questions or updates, please contact [facility] Please consult [email or web address] for contact info under        Signed, Jodelle Red, MD  02/08/2023, 10:39 AM

## 2023-02-08 NOTE — Progress Notes (Addendum)
PROGRESS NOTE    Todd Mccoy  ZOX:[id number] DOB: 1971-04-09 DOA: 02/04/2023 PCP: [name], MD    Chief Complaint  Patient presents with   Abdominal Pain   Weakness    Brief Narrative: Patient 52 year old gentleman history of poorly controlled hypertension, diabetes, HF R EF, CKD stage IV, blindness, PVD status post left BKA presenting to the ED with 3 days of diarrhea, decreased urine output, decreased oral intake.  Patient noted to be volume overloaded on examination and hypotensive.  Patient noted to be in acute on chronic renal failure requiring CVVHD.  Patient also required pressors for shock felt likely combination of hypovolemic and septic shock.  Patient received a course of IV antibiotics for possible UTI.  Nephrology consulted and following.  Cardiology also consulted.  Patient noted to have bouts of agitation and noted to be physically and verbally abusive to staff and subsequently placed on a Precedex drip.  Precedex drip subsequently weaned off, patient transferred to the floor and transferred to [facility].   Assessment & Plan:   Principal Problem:   Shock circulatory (HCC) Active Problems:   Encephalopathy acute   Diarrhea of presumed infectious origin   Metabolic acidosis   Anemia of chronic disease   Acute renal failure superimposed on chronic kidney disease (HCC)  #1 acute kidney injury on CKD stage V -Patient noted to have presented with decreased urine output prior to admission and concerned that patient may have a progressive underlying CKD. -Patient started on CRRT 02/05/2023 due to volume overload and acidosis. -CRRT discontinued 8 4 and plan was to start intermittent hemodialysis. -Patient transferred to [facility]. -Patient currently with a urine output of 172 5 cc over the past 24 hours. -Patient seen in consultation by nephrology who are recommending again to hold off on HD today and monitor patient for tentative HD to be  rescheduled for tomorrow. -Per nephrology.  2.  Hypovolemic shock/probable septic shock -Patient noted to have presented in shock felt likely multifactorial secondary to hypovolemia and possibly secondary to UTI. -Patient admitted to the ICU requiring pressors which have subsequently been discontinued, and patient pancultured. -Blood cultures with no growth to date. -Urine cultures with multiple species present. -Status post 3 days of IV Rocephin. -Shock currently resolved. -Follow.  3.  Acute on chronic biventricular systolic failure with pleural effusion/pericardial effusion -??  Etiology. -Patient seen in consultation by cardiology noted to be significantly volume overloaded on examination and AKI on CKD stage V. -2D echo done with EF of 40 to 45%, left ventricular global hypokinesis, grade 3 diastolic dysfunction, severely elevated PASP.  Moderately reduced right ventricular systolic function.  Moderately enlarged right ventricular size.  Moderately dilated left atrial size and right atrial size.  Moderate pericardial effusion with no definite evidence of cardiac tamponade. -Patient was on CRRT for volume management. -No ACE inhibitor/ARB due to renal disease. -Per cardiology unable to use beta-blocker due to baseline bradycardia. -Patient currently on Lasix 80 mg IV every 12 hours. -Urine output of 1.725 L over the past 24 hours. -Cardiology recommending use of hydralazine/isosorbide. -Cardiology deferring cath at this time due to renal disease and recommending repeat 2D echo in 3 months. -Volume management per nephrology. -Cardiology was following but have signed off as of 02/08/2023.  4.  Metabolic acidosis -Secondary to AKI on CKD stage V. -Improved on CRRT. -Per nephrology.  5.  Hypertension -Continue hydralazine 25 mg 3 times daily, Imdur 30 mg daily and uptitrate as needed for better  blood pressure control.  6.  Acute metabolic encephalopathy -Patient noted to have some  bouts of agitation, noted to be verbally abusive and physically abusive to staff in the ICU. -Felt likely secondary to acute illness. -Clinical improvement on Precedex drip. -Precedex drip has been weaned off. -Supportive care.  7.  Diarrhea -Resolved.  8.  Diabetes mellitus with hypoglycemia -Likely secondary to poor oral intake. -Hemoglobin A1c 5.6 (02/06/2023) -Patient noted to have some bouts of hypoglycemia, CBG at 122 this morning. -Was on D10 which has subsequently been discontinued. -Patient currently on oral diet. -Follow.  9.  Anemia of chronic disease -Anemia panel with iron level of 13, TIBC of 197. -Hemoglobin currently at 7.7. -Currently receiving IV iron. -Follow H&H. -Transfusion threshold hemoglobin < 7.     DVT prophylaxis: Heparin Code Status: Full Family Communication: Updated patient. Updated son on phone.  Disposition: TBD  Status is: Inpatient Remains inpatient appropriate because: Severity of illness   Consultants:  Nephrology: Dr. Glenna Fellows 02/05/2023 Cardiology: Dr. Elease Hashimoto 02/05/2023 Wound care RN: Ria Bush 02/05/2023  Procedures:  CT renal stone protocol 02/04/2023 Chest x-ray 02/05/2023 2D echo 02/05/2023  Significant Hospital Events:  8/3 admitted to ICU for CRRT 8/4 transferred to [facility] for IHD, delirium and agitation, precedex continued  Antimicrobials:  Anti-infectives (From admission, onward)    Start     Dose/Rate Route Frequency Ordered Stop   02/06/23 0100  cefTRIAXone (ROCEPHIN) 2 g in sodium chloride 0.9 % 100 mL IVPB  Status:  Discontinued        2 g 200 mL/hr over 30 Minutes Intravenous Every 24 hours 02/05/23 0444 02/07/23 1543   02/05/23 0100  cefTRIAXone (ROCEPHIN) 2 g in sodium chloride 0.9 % 100 mL IVPB        2 g 200 mL/hr over 30 Minutes Intravenous  Once 02/05/23 0047 02/05/23 0122         Subjective: States he feels great today.  States shortness of breath is improved.  Denies any chest pain.  Stated suprapubic lower  abdominal pain has improved significantly since admission.  Still with complaints of weakness.  Objective: Vitals:   02/08/23 0900 02/08/23 0954 02/08/23 1036 02/08/23 1636  BP: (!) 162/88 (!) 159/87 (!) 151/83 (!) 170/90  Pulse: 85  82 94  Resp: 16  19 18   Temp:   98 F (36.7 C) 98.2 F (36.8 C)  TempSrc:   Oral Oral  SpO2: 100%  100% 100%  Weight:      Height:        Intake/Output Summary (Last 24 hours) at 02/08/2023 1816 Last data filed at 02/08/2023 0400 Gross per 24 hour  Intake --  Output 925 ml  Net -925 ml   Filed Weights   02/06/23 0500 02/07/23 0355 02/08/23 0411  Weight: 101.2 kg 99.4 kg 100.3 kg    Examination:  General exam: Appears calm and comfortable  Respiratory system: Clear to auscultation anterior lung fields.  No wheezes, no crackles, no rhonchi.  Fair air movement.  Speaking in full sentences.Marland Kitchen Respiratory effort normal. Cardiovascular system: S1 & S2 heard, RRR. No JVD, murmurs, rubs, gallops or clicks. No pedal edema. Gastrointestinal system: Abdomen is nondistended, soft and decreased tenderness to palpation in the suprapubic region.  Positive bowel sounds.  No rebound.  No guarding. Central nervous system: Alert and oriented. No focal neurological deficits. Extremities: Status post L BKA Skin: No rashes, lesions or ulcers Psychiatry: Judgement and insight appear normal. Mood & affect appropriate.  Data Reviewed: I have personally reviewed following labs and imaging studies  CBC: Recent Labs  Lab 02/04/23 2107 02/05/23 0451 02/07/23 0202 02/08/23 0119  WBC 7.0 7.4 7.8 8.3  NEUTROABS 6.2  --   --   --   HGB 8.4* 8.1* 9.0* 7.7*  HCT 28.0* 27.4* 29.3* 25.2*  MCV 87.8 87.5 84.7 84.0  PLT 107* 112* PLATELET CLUMPS NOTED ON SMEAR, UNABLE TO ESTIMATE 99*    Basic Metabolic Panel: Recent Labs  Lab 02/05/23 0451 02/05/23 1531 02/06/23 0323 02/06/23 1519 02/06/23 1700 02/07/23 0202 02/07/23 1651 02/08/23 0119 02/08/23 1554  NA 137    < > 136  --  139 137 137 138 139  K 5.9*   < > 4.9  --  4.7 4.9 4.5 4.7 4.9  CL 114*   < > 110  --  109 107 108 107 105  CO2 11*   < > 16*  --  20* 17* 17* 18* 15*  GLUCOSE 141*   < > 83  --  82 81 94 95 152*  BUN 101*   < > 67*  --  57* 59* 61* 62* 66*  CREATININE 8.81*  8.60*   < > 5.25*  --  5.08* 4.86* 5.27* 5.30* 5.43*  CALCIUM 7.5*   < > 7.5*   < > 7.5* 7.5* 7.5* 7.6* 7.5*  MG 1.8  1.7  --  1.9  --   --  2.0  --  1.9  --   PHOS 6.4*   < > 3.4  --  3.1 3.5 4.2 4.1 3.8   < > = values in this interval not displayed.    GFR: Estimated Creatinine Clearance: 19.3 mL/min (A) (by C-G formula based on SCr of 5.43 mg/dL (H)).  Liver Function Tests: Recent Labs  Lab 02/04/23 2107 02/05/23 1531 02/06/23 1700 02/07/23 0202 02/07/23 1651 02/08/23 0119 02/08/23 1554  AST 11*  --   --   --   --   --   --   ALT 12  --   --   --   --   --   --   ALKPHOS 123  --   --   --   --   --   --   BILITOT 0.6  --   --   --   --   --   --   PROT 7.1  --   --   --   --   --   --   ALBUMIN 2.7*   < > 2.1* 2.2* 2.1* 2.0* 2.1*   < > = values in this interval not displayed.    CBG: Recent Labs  Lab 02/07/23 2321 02/08/23 0324 02/08/23 0759 02/08/23 1144 02/08/23 1705  GLUCAP 97 87 122* 81 122*     Recent Results (from the past 240 hour(s))  Blood culture (routine x 2)     Status: None (Preliminary result)   Collection Time: 02/04/23  9:07 PM   Specimen: BLOOD RIGHT FOREARM  Result Value Ref Range Status   Specimen Description   Final    BLOOD RIGHT FOREARM Performed at [facility], [address], [place], [name] [address]    Special Requests   Final    Blood Culture adequate volume BOTTLES DRAWN AEROBIC AND ANAEROBIC Performed at [facility], [address], [place], [name] [address]    Culture   Final    NO GROWTH 3 DAYS Performed at [facility], [address] [place], [name] [address]    Report Status PENDING  Incomplete  Blood culture  (routine x 2)     Status: None (Preliminary result)   Collection Time: 02/04/23  9:26 PM   Specimen: BLOOD LEFT FOREARM  Result Value Ref Range Status   Specimen Description   Final    BLOOD LEFT FOREARM Performed at [facility], [address] [place], [name] [address]    Special Requests   Final    Blood Culture adequate volume BOTTLES DRAWN AEROBIC AND ANAEROBIC Performed at [facility], [address] [place], [name] [address]    Culture   Final    NO GROWTH 3 DAYS Performed at [facility], [address], [place], [name] [address]    Report Status PENDING  Incomplete  Urine Culture     Status: Abnormal   Collection Time: 02/05/23 12:08 AM   Specimen: Urine, Random  Result Value Ref Range Status   Specimen Description   Final    URINE, RANDOM Performed at [facility], [address] [place], [name] [address]    Special Requests   Final    NONE Performed at [facility], [address], [place], [name] [address]    Culture MULTIPLE SPECIES PRESENT, SUGGEST RECOLLECTION (A)  Final   Report Status 02/07/2023 FINAL  Final  MRSA Next Gen by PCR, Nasal     Status: Abnormal   Collection Time: 02/05/23  9:30 AM   Specimen: Nasal Mucosa; Nasal Swab  Result Value Ref Range Status   MRSA by PCR Next Gen DETECTED (A) NOT DETECTED Final    Comment: (NOTE) The GeneXpert MRSA Assay (FDA approved for NASAL specimens only), is one component of a comprehensive MRSA colonization surveillance program. It is not intended to diagnose MRSA infection nor to guide or monitor treatment for MRSA infections. Test performance is not FDA approved in patients less than 60 years old. Performed at [facility], [address], [place], [name] [address]   Culture, blood (Routine X 2) w Reflex to ID Panel     Status: None (Preliminary result)   Collection Time: 02/06/23  3:45 PM   Specimen: BLOOD LEFT  HAND  Result Value Ref Range Status   Specimen Description BLOOD LEFT HAND  Final   Special Requests   Final    BOTTLES DRAWN AEROBIC ONLY Blood Culture results may not be optimal due to an inadequate volume of blood received in culture bottles   Culture   Final    NO GROWTH 2 DAYS Performed at [facility], [address], [place], [name] [address]    Report Status PENDING  Incomplete  Culture, blood (Routine X 2) w Reflex to ID Panel     Status: None (Preliminary result)   Collection Time: 02/06/23  3:45 PM   Specimen: BLOOD RIGHT HAND  Result Value Ref Range Status   Specimen Description BLOOD RIGHT HAND  Final   Special Requests   Final    BOTTLES DRAWN AEROBIC ONLY Blood Culture results may not be optimal due to an inadequate volume of blood received in culture bottles   Culture   Final    NO GROWTH 2 DAYS Performed at [facility], [address] [place], [name] [address]    Report Status PENDING  Incomplete         Radiology Studies: No results  found.      Scheduled Meds:  atorvastatin  20 mg Oral Daily   Chlorhexidine Gluconate Cloth  6 each Topical Q0600   darbepoetin (ARANESP) injection - NON-DIALYSIS  60 mcg Subcutaneous Q Mon-1800   feeding supplement (NEPRO CARB STEADY)  237 mL Oral TID BM   furosemide  80 mg Intravenous Q12H   heparin  5,000 Units Subcutaneous Q8H   hydrALAZINE  25 mg Oral TID   isosorbide mononitrate  30 mg Oral Daily   multivitamin  1 tablet Oral QHS   mupirocin ointment  1 Application Nasal BID   sodium chloride flush  10-40 mL Intracatheter Q12H   Continuous Infusions:  sodium chloride Stopped (02/07/23 0555)   ferric gluconate (FERRLECIT) IVPB 250 mg (02/08/23 1044)     LOS: 3 days    Time spent: 40 minutes    Ramiro Harvest, MD [facility]   To contact the attending provider between 7A-7P or the covering provider during after hours 7P-7A, please log into the web site [email or web address] and access  using universal [place] password for that web site. If you do not have the password, please call the hospital operator.  02/08/2023, 6:16 PM

## 2023-02-08 NOTE — Progress Notes (Signed)
[facility] Progress Note   Subjective:   Alert and talkative this AM-  getting ready to eat breakfast-  says is now blind ( was not good before but now worse)  labs pretty stable-  1700 of UOP   Objective Vitals:   02/08/23 0403 02/08/23 0411 02/08/23 0500 02/08/23 0600  BP:   (!) 161/97 (!) 174/102  Pulse:   84 86  Resp:   19 14  Temp: 98.7 F (37.1 C)     TempSrc: Oral     SpO2:   98% 96%  Weight:  100.3 kg    Height:       Physical Exam General: more alert and talkative Heart: RRR, no rub Lungs: dec BS bases, normal WOB on RA Abdomen: soft, nontender, mildly distended Extremities: 1+  edema, L BKA Dialysis Access: RIJ temp HD cath placed 8/3  Additional Objective Labs: Basic Metabolic Panel: Recent Labs  Lab 02/07/23 0202 02/07/23 1651 02/08/23 0119  NA 137 137 138  K 4.9 4.5 4.7  CL 107 108 107  CO2 17* 17* 18*  GLUCOSE 81 94 95  BUN 59* 61* 62*  CREATININE 4.86* 5.27* 5.30*  CALCIUM 7.5* 7.5* 7.6*  PHOS 3.5 4.2 4.1   Liver Function Tests: Recent Labs  Lab 02/04/23 2107 02/05/23 1531 02/07/23 0202 02/07/23 1651 02/08/23 0119  AST 11*  --   --   --   --   ALT 12  --   --   --   --   ALKPHOS 123  --   --   --   --   BILITOT 0.6  --   --   --   --   PROT 7.1  --   --   --   --   ALBUMIN 2.7*   < > 2.2* 2.1* 2.0*   < > = values in this interval not displayed.   Recent Labs  Lab 02/04/23 2107  LIPASE 46   CBC: Recent Labs  Lab 02/04/23 2107 02/05/23 0451 02/07/23 0202 02/08/23 0119  WBC 7.0 7.4 7.8 8.3  NEUTROABS 6.2  --   --   --   HGB 8.4* 8.1* 9.0* 7.7*  HCT 28.0* 27.4* 29.3* 25.2*  MCV 87.8 87.5 84.7 84.0  PLT 107* 112* PLATELET CLUMPS NOTED ON SMEAR, UNABLE TO ESTIMATE 99*   Blood Culture    Component Value Date/Time   SDES BLOOD LEFT HAND 02/06/2023 1545   SDES BLOOD RIGHT HAND 02/06/2023 1545   SPECREQUEST  02/06/2023 1545    BOTTLES DRAWN AEROBIC ONLY Blood Culture results may not be optimal due to an  inadequate volume of blood received in culture bottles   SPECREQUEST  02/06/2023 1545    BOTTLES DRAWN AEROBIC ONLY Blood Culture results may not be optimal due to an inadequate volume of blood received in culture bottles   CULT  02/06/2023 1545    NO GROWTH < 24 HOURS Performed at [facility], [address] [place], [name] [address]    CULT  02/06/2023 1545    NO GROWTH < 24 HOURS Performed at [facility], [address], [place], [name] [address]    REPTSTATUS PENDING 02/06/2023 1545   REPTSTATUS PENDING 02/06/2023 1545    Cardiac Enzymes: No results for input(s): "CKTOTAL", "CKMB", "CKMBINDEX", "TROPONINI" in the last 168 hours. CBG: Recent Labs  Lab 02/07/23 1106 02/07/23 1620 02/07/23 1931 02/07/23 2321 02/08/23 0324  GLUCAP 85 87 132* 97 87  Iron Studies:  Recent Labs    02/07/23 0202  IRON 13*  TIBC 197*   @lablastinr3 @ Studies/Results: No results found. Medications:  sodium chloride Stopped (02/07/23 0555)    atorvastatin  20 mg Oral Daily   Chlorhexidine Gluconate Cloth  6 each Topical Q0600   darbepoetin (ARANESP) injection - NON-DIALYSIS  60 mcg Subcutaneous Q Mon-1800   feeding supplement (NEPRO CARB STEADY)  237 mL Oral TID BM   furosemide  80 mg Intravenous Q12H   heparin  5,000 Units Subcutaneous Q8H   hydrALAZINE  10 mg Oral TID   isosorbide dinitrate  10 mg Oral TID   multivitamin  1 tablet Oral QHS   mupirocin ointment  1 Application Nasal BID   Assessment/Plan **Shock:  Improved, cultures neg. Off pressors.  Off abx   **AMS:  very agitated yest requiring precedex initially-  now better     **AKI on advanced CKD: Baseline CKD 5, had missed fu at [facility] since 08/2022.  Reported anuric x 24h prior to admission.  Suspect he's had progression of underlying CKD based in 10/2022 labs.  Started CRRT 8/3 for volume overload and acidosis.   d/c CRRT on 8/4 and plan to  start IHD -- transferred to [facility] for that.    He would accept  outpt HD if necessary.  Phos ok, checking PTH ( pending)   If not recovering pt will need CLIP and perm access consult.  So crt is holding its own albeit not in a great place-  he is not uremic and no acute indications for HD.  Am going to hold HD again today -  tentatively reschedule for tomorrow.  I dont have a great feeling that he will fly off of HD mostly due to social issues ( blind, says unable to ambulate )  but we will give it a try-  see if we can keep him stable with only medical management and no dialysis-  keep access in for now however   **HFrEF: 01/2022 EF 40%, severe RV dysfunction, pericardial effusion w/o tamponade.  Overloaded.  Cardiology following.  challenging with lasix and it is working-  also increase hydralazine for BP control    **Volume:  volume overloaded but on RA, has had UF with CRRT, acheiveing negative balance with lasix   **metabolic acidosis:  secondary to AKI/CKD, improved with RRT and holding   **Anemia:  suspect of CKD, no reported bleeding.  iron indices low-  will give.  started ESA.-     **Thrombocytopenia:  plt ~100, new finding.  Hold heparin with RRT.   Hypoglycemia- better-  now able to eat     Todd Mccoy  02/08/2023, 7:42 AM  [employer]

## 2023-02-08 NOTE — Evaluation (Signed)
Occupational Therapy Evaluation Patient Details Name: Todd Mccoy MRN: [id number] DOB: 1971/04/11 Today's Date: 02/08/2023   History of Present Illness 52 yo male admitted 8/2 with weakness and diarrhea. Pt hypothermic with anasarca and acute on chronic renal failure. CRRT 8/3-8/5. PMhx: legal blindness, poorly controlled HTN, HLD, CHF, Lt BKA, CKD, T2DM, obesity, neuropathy and nephropathy (nephrotic syndrome) and retinopathy.   Clinical Impression   [name] was evaluated s/p the above admission list. Pt is a questionable historian and provided conflicting information, however he reports he is mod I for transfers, [facility] navigation and ADLs at baseline, family assists with IADLs. Pt does not have his prosthetic acutely, and reports he last wore it about 1 week ago, but he does not walk with it. Upon evaluation the pt was limited by impaired cognition, self-limiting behaviors, low vision, L BKA, loose stool, abdominal pain, weakness and decreased activity tolerance. Overall pt generally refused most participation due to "swelling" and "pain," despite maximal encouragement and only minimal rolling attempted. Pt reports he will "try again" tomorrow if therapy comes back when he has more energy. Due to impairments detailed below, pt currently is total A at bed level for mobility and LB ADLs and set up A for UB ADLs. Pt will benefit from continued acute OT services and skilled inpatient follow up therapy, <3 hours/day.       Recommendations for follow up therapy are one component of a multi-disciplinary discharge planning process, led by the attending physician.  Recommendations may be updated based on patient status, additional functional criteria and insurance authorization.   Assistance Recommended at Discharge Frequent or constant Supervision/Assistance  Patient can return home with the following Two people to help with walking and/or transfers;A lot of help with walking and/or transfers;A lot of  help with bathing/dressing/bathroom;Two people to help with bathing/dressing/bathroom;Assistance with cooking/housework;Direct supervision/assist for medications management;Assistance with feeding;Direct supervision/assist for financial management;Assist for transportation;Help with stairs or ramp for entrance    Functional Status Assessment  Patient has had a recent decline in their functional status and demonstrates the ability to make significant improvements in function in a reasonable and predictable amount of time.  Equipment Recommendations  Other (comment) (defer)       Precautions / Restrictions Precautions Precautions: Fall Precaution Comments: blind, L BKA Restrictions Weight Bearing Restrictions: No      Mobility Bed Mobility Overal bed mobility: Needs Assistance Bed Mobility: Rolling Rolling: Max assist         General bed mobility comments: attempted rolling to initiate siting EOB, pt refused due to pain and weakness. Unable to encourage EOB or OOB.    Transfers Overall transfer level: Needs assistance Equipment used: None               General transfer comment: pt refused to attempt      Balance Overall balance assessment: Needs assistance                                         ADL either performed or assessed with clinical judgement   ADL Overall ADL's : Needs assistance/impaired Eating/Feeding: Set up;Sitting   Grooming: Set up;Sitting   Upper Body Bathing: Set up;Sitting   Lower Body Bathing: Maximal assistance;Bed level   Upper Body Dressing : Set up;Sitting   Lower Body Dressing: Maximal assistance;Bed level   Toilet Transfer: Total assistance Toilet Transfer Details (indicate cue type and reason):  pt refused to attempt transfer Toileting- Clothing Manipulation and Hygiene: Total assistance;Bed level       Functional mobility during ADLs: Maximal assistance (for minimal movement at bed level) General ADL  Comments: Pt refused to participate in much movement, reportign pain and weakness. self-limiting. impaired cognition, poor insight     Vision Baseline Vision/History: 2 Legally blind Ability to See in Adequate Light: 4 Severely impaired Patient Visual Report: Blurring of vision Vision Assessment?: Vision impaired- to be further tested in functional context Additional Comments: questionable historian. pt said he used to see 20/20, but one eye wen bad then the other. pt also stating his vision is now much worse than it used to be, however timeline of events were very unclear. Reports he is able to see shapes and shadows     Perception Perception Perception Tested?: No   Praxis Praxis Praxis tested?: Not tested    Pertinent Vitals/Pain Pain Assessment Pain Assessment: 0-10 Pain Score: 8  Faces Pain Scale: Hurts a little bit Pain Location: "everyewhere" Pain Descriptors / Indicators: Grimacing Pain Intervention(s): Limited activity within patient's tolerance, Monitored during session     Hand Dominance Left   Extremity/Trunk Assessment Upper Extremity Assessment Upper Extremity Assessment: Generalized weakness (Full ROM, pt not allowing me to test MMT)   Lower Extremity Assessment Lower Extremity Assessment: Defer to PT evaluation (L BKA)   Cervical / Trunk Assessment Cervical / Trunk Assessment: Normal   Communication Communication Communication: No difficulties   Cognition Arousal/Alertness: Awake/alert Behavior During Therapy: WFL for tasks assessed/performed Overall Cognitive Status: Impaired/Different from baseline Area of Impairment: Memory, Attention, Following commands, Safety/judgement, Problem solving, Awareness                   Current Attention Level: Sustained Memory: Decreased recall of precautions, Decreased short-term memory Following Commands: Follows one step commands inconsistently Safety/Judgement: Decreased awareness of safety, Decreased  awareness of deficits Awareness: Intellectual Problem Solving: Slow processing, Decreased initiation, Requires verbal cues General Comments: Unsure of baseline cognition. questionable historian. pt reporting conflicting PLOF information. Slef-limiting, continued to say "I'm going to try" and then he would move and inch and say he couldn't do it becasue his leg was swollen and he is too weak. Currently reporting he will refuse rehab.     General Comments  VSS. pt left on bed pan for BM.            Home Living Family/patient expects to be discharged to:: Private residence Living Arrangements: Parent;Children;Other relatives Available Help at Discharge: Family;Available 24 hours/day Type of Home: House Home Access: Ramped entrance     Home Layout: Able to live on main level with bedroom/bathroom     Bathroom Shower/Tub: Chief Strategy Officer: Standard Bathroom Accessibility: Yes How Accessible: Accessible via wheelchair Home Equipment: Rolling Walker (2 wheels);Rollator (4 wheels);BSC/3in1;Tub bench;Wheelchair - manual   Additional Comments: lives with mom, son and niece      Prior Functioning/Environment Prior Level of Function : Needs assist             Mobility Comments: pt reports mod I transfers, mobilizes at w/c level. States last time he wore his leg was ~1 week ago, does not walk on prosthetic. denies falls ADLs Comments: questionable historian. reports mod I ADLs including toilet transfers, bathing and LB ADLs. Mom manages meals, niece manages medicaiton        OT Problem List: Decreased strength;Decreased range of motion;Decreased activity tolerance;Impaired balance (sitting and/or standing);Decreased cognition;Decreased safety awareness;Decreased  knowledge of use of DME or AE;Decreased knowledge of precautions;Impaired UE functional use;Pain;Increased edema      OT Treatment/Interventions: Self-care/ADL training;Therapeutic exercise;DME and/or AE  instruction;Therapeutic activities;Patient/family education;Balance training    OT Goals(Current goals can be found in the care plan section) Acute Rehab OT Goals Patient Stated Goal: to go home OT Goal Formulation: With patient Time For Goal Achievement: 02/22/23 Potential to Achieve Goals: Fair ADL Goals Pt Will Perform Lower Body Dressing: with min assist;sit to/from stand;sitting/lateral leans Pt Will Transfer to Toilet: with min assist;bedside commode;squat pivot transfer Pt Will Perform Toileting - Clothing Manipulation and hygiene: with min guard assist;sitting/lateral leans Additional ADL Goal #1: pt will complete bed mobility with min A as a precursor to ADLs  OT Frequency: Min 1X/week       AM-PAC OT "6 Clicks" Daily Activity     Outcome Measure Help from another person eating meals?: A Little Help from another person taking care of personal grooming?: A Little Help from another person toileting, which includes using toliet, bedpan, or urinal?: Total Help from another person bathing (including washing, rinsing, drying)?: A Lot Help from another person to put on and taking off regular upper body clothing?: A Little Help from another person to put on and taking off regular lower body clothing?: Total 6 Click Score: 13   End of Session Nurse Communication: Mobility status  Activity Tolerance: Patient limited by pain Patient left: in bed;with call bell/phone within reach;with bed alarm set  OT Visit Diagnosis: Unsteadiness on feet (R26.81);Other abnormalities of gait and mobility (R26.89);Muscle weakness (generalized) (M62.81);Pain                Time: 6387-5643 OT Time Calculation (min): 16 min Charges:  OT General Charges $OT Visit: 1 Visit OT Evaluation $OT Eval Moderate Complexity: 1 Mod  Derenda Mis, OTR/L Acute Rehabilitation Services Office [phone] Secure Chat Communication Preferred   Donia Pounds 02/08/2023, 4:47 PM

## 2023-02-09 ENCOUNTER — Other Ambulatory Visit: Payer: Medicare Other | Admitting: *Deleted

## 2023-02-09 DIAGNOSIS — R579 Shock, unspecified: Secondary | ICD-10-CM | POA: Diagnosis not present

## 2023-02-09 LAB — GLUCOSE, CAPILLARY
Glucose-Capillary: 81 mg/dL (ref 70–99)
Glucose-Capillary: 89 mg/dL (ref 70–99)
Glucose-Capillary: 130 mg/dL — ABNORMAL HIGH (ref 70–99)
Glucose-Capillary: 131 mg/dL — ABNORMAL HIGH (ref 70–99)
Glucose-Capillary: 109 mg/dL — ABNORMAL HIGH (ref 70–99)
Glucose-Capillary: 164 mg/dL — ABNORMAL HIGH (ref 70–99)

## 2023-02-09 MED ORDER — LOPERAMIDE HCL 2 MG PO CAPS
2.0000 mg | ORAL_CAPSULE | ORAL | Status: DC | PRN
  Administered 2023-02-09 – 2023-02-13 (×2): 2 mg via ORAL
  Filled 2023-02-09 (×2): qty 1

## 2023-02-09 MED ORDER — DARBEPOETIN ALFA 60 MCG/0.3ML IJ SOSY
60.0000 ug | PREFILLED_SYRINGE | INTRAMUSCULAR | Status: DC
  Administered 2023-02-09: 60 ug via SUBCUTANEOUS
  Filled 2023-02-09: qty 0.3

## 2023-02-09 NOTE — Progress Notes (Signed)
   02/09/23 0610  Vitals  BP Location Right Arm  BP Method Automatic  Patient Position (if appropriate) Lying  Pulse Rate Source Monitor  During Treatment Monitoring  HD Safety Checks Performed Yes  Intra-Hemodialysis Comments Tx completed  Post Treatment  Dialyzer Clearance Lightly streaked  Hemodialysis Intake (mL) 3 mL  Liters Processed 60.7  Tolerated HD Treatment Yes  Post-Hemodialysis Comments Pt stable  Hemodialysis Catheter Right Internal jugular Triple lumen Temporary (Non-Tunneled)  Placement Date/Time: 02/05/23 0700   Time Out: Correct patient;Correct site;Correct procedure  Maximum sterile barrier precautions: Hand hygiene;Cap;Mask;Sterile gown;Sterile gloves;Large sterile sheet  Site Prep: Chlorhexidine (preferred)  Ultrasound...  Site Condition No complications  Blue Lumen Status Heparin locked  Red Lumen Status Heparin locked  Catheter fill solution Heparin 1000 units/ml  Catheter fill volume (Arterial) 1.5 cc  Catheter fill volume (Venous) 1.5  Dressing Type Transparent  Dressing Status Clean, Dry, Intact  Post treatment catheter status Capped and Clamped   Right internal jugular catheter function without complication. Post wgt 114kg. V/S 154/84 hr-82 temp 98.60F. UF 

## 2023-02-09 NOTE — Evaluation (Signed)
Physical Therapy Evaluation Patient Details Name: Todd Mccoy MRN: [id number] DOB: [birth year] Today's Date: 02/09/2023  History of Present Illness  52 yo male admitted 8/2 with weakness and diarrhea. Pt hypothermic with anasarca and acute on chronic renal failure. CRRT 8/3-8/5.  Initiated IHD with pt having this 8/6.  PMhx: legal blindness, poorly controlled HTN, HLD, CHF, Lt BKA, CKD, T2DM, obesity, neuropathy and nephropathy (nephrotic syndrome) and retinopathy.  Clinical Impression  Pt admitted with above diagnosis. Pt was unable to tolerate more than bed mobility today as he continues to have loose stools. Pt did perform some LE exercises. Pt self limiting and stating that he can't get to EOB. Then pt tells this PT he is anxious and that he is going to "jump out the window."  Notified nursing.  Will follow acutely and progress pt as able.  Pt currently with functional limitations due to the deficits listed below (see PT Problem List). Pt will benefit from acute skilled PT to increase their independence and safety with mobility to allow discharge.           If plan is discharge home, recommend the following: A lot of help with walking and/or transfers;A lot of help with bathing/dressing/bathroom;Assistance with cooking/housework;Assistance with feeding;Assist for transportation;Help with stairs or ramp for entrance;Supervision due to cognitive status   Can travel by private vehicle   No    Equipment Recommendations Other (comment) (TBA)  Recommendations for Other Services       Functional Status Assessment Patient has had a recent decline in their functional status and demonstrates the ability to make significant improvements in function in a reasonable and predictable amount of time.     Precautions / Restrictions Precautions Precautions: Fall Precaution Comments: blind, L BKA Restrictions Weight Bearing Restrictions: No      Mobility  Bed Mobility Overal bed mobility:  Needs Assistance Bed Mobility: Rolling Rolling: Max assist         General bed mobility comments: attempted rolling to initiate siting EOB, pt refused due to pain and weakness. Unable to encourage EOB or OOB.  Nurse aware that pt is having loose stools and NT in room to clean him after session.    Transfers Overall transfer level: Needs assistance Equipment used: None               General transfer comment: pt refused to attempt    Ambulation/Gait                  Stairs            Wheelchair Mobility     Tilt Bed    Modified Rankin (Stroke Patients Only)       Balance Overall balance assessment: Needs assistance                                           Pertinent Vitals/Pain Pain Assessment Pain Assessment: 0-10 Pain Score: 8  Faces Pain Scale: Hurts a little bit Breathing: normal Negative Vocalization: none Facial Expression: smiling or inexpressive Body Language: relaxed Consolability: no need to console PAINAD Score: 0 Pain Location: "everyewhere" Pain Descriptors / Indicators: Grimacing Pain Intervention(s): Limited activity within patient's tolerance, Monitored during session, Repositioned    Home Living Family/patient expects to be discharged to:: Private residence Living Arrangements: Parent;Children;Other relatives Available Help at Discharge: Family;Available 24 hours/day Type of Home: House Home Access:  Ramped entrance       Home Layout: Able to live on main level with bedroom/bathroom Home Equipment: Rolling Walker (2 wheels);Rollator (4 wheels);BSC/3in1;Tub bench;Wheelchair - manual Additional Comments: lives with mom, son and niece    Prior Function Prior Level of Function : Needs assist             Mobility Comments: pt reports mod I transfers, mobilizes at w/c level. States last time he wore his leg was ~1 week ago, does not walk on prosthetic. denies falls ADLs Comments: questionable  historian. reports mod I ADLs including toilet transfers, bathing and LB ADLs. Mom manages meals, niece manages medicaiton     Extremity/Trunk Assessment   Upper Extremity Assessment Upper Extremity Assessment: Defer to OT evaluation    Lower Extremity Assessment Lower Extremity Assessment: Generalized weakness;RLE deficits/detail;LLE deficits/detail RLE Deficits / Details: Wrapped in ace wrap and pt appears to move it but would not let PT MMT, pt also with wounds under thigh on right LE LLE Deficits / Details: hip and knee WFL       Communication   Communication Communication: No apparent difficulties  Cognition Arousal: Alert Behavior During Therapy: WFL for tasks assessed/performed, Restless Overall Cognitive Status: Impaired/Different from baseline Area of Impairment: Memory, Attention, Following commands, Safety/judgement, Problem solving, Awareness                   Current Attention Level: Sustained Memory: Decreased recall of precautions, Decreased short-term memory Following Commands: Follows one step commands inconsistently Safety/Judgement: Decreased awareness of safety, Decreased awareness of deficits Awareness: Intellectual Problem Solving: Slow processing, Decreased initiation, Requires verbal cues General Comments: Unsure of baseline cognition. questionable historian. pt reporting conflicting PLOF information. Self-limiting, continued to say "I'm going to try" and then he would move and inch and say he couldn't do it becasue his leg was swollen and he is too weak. Currently reporting he will refuse rehab.        General Comments General comments (skin integrity, edema, etc.): VSS    Exercises General Exercises - Lower Extremity Ankle Circles/Pumps: AROM, Both, 10 reps, Supine Quad Sets: AROM, Both, 5 reps, Supine Heel Slides: AROM, Right, 10 reps, Supine   Assessment/Plan    PT Assessment Patient needs continued PT services  PT Problem List  Decreased activity tolerance;Decreased balance;Decreased mobility;Decreased strength;Decreased range of motion;Decreased knowledge of use of DME;Decreased safety awareness;Decreased cognition;Decreased knowledge of precautions;Decreased skin integrity;Pain       PT Treatment Interventions DME instruction;Functional mobility training;Therapeutic activities;Therapeutic exercise;Balance training;Patient/family education;Wheelchair mobility training    PT Goals (Current goals can be found in the Care Plan section)  Acute Rehab PT Goals Patient Stated Goal: to get better PT Goal Formulation: With patient Time For Goal Achievement: 02/23/23 Potential to Achieve Goals: Good    Frequency Min 1X/week     Co-evaluation               AM-PAC PT "6 Clicks" Mobility  Outcome Measure Help needed turning from your back to your side while in a flat bed without using bedrails?: A Lot Help needed moving from lying on your back to sitting on the side of a flat bed without using bedrails?: Total Help needed moving to and from a bed to a chair (including a wheelchair)?: Total Help needed standing up from a chair using your arms (e.g., wheelchair or bedside chair)?: Total Help needed to walk in hospital room?: Total Help needed climbing 3-5 steps with a railing? : Total 6 Click  Score: 7    End of Session   Activity Tolerance: Patient limited by fatigue (self limiting) Patient left: in bed;with call bell/phone within reach;with bed alarm set Nurse Communication: Mobility status;Need for lift equipment PT Visit Diagnosis: Muscle weakness (generalized) (M62.81)    Time: 4259-5638 PT Time Calculation (min) (ACUTE ONLY): 12 min   Charges:   PT Evaluation $PT Eval Moderate Complexity: 1 Mod   PT General Charges $$ ACUTE PT VISIT: 1 Visit          M,PT Acute Rehab Services [phone]   Bevelyn Buckles 02/09/2023, 1:36 PM

## 2023-02-09 NOTE — Patient Outreach (Signed)
Care Coordination  02/09/2023  Todd Mccoy 08/28/1970 [id number]  Todd Mccoy is currently admitted as an inpatient at [facility]. The [facility] Managed Care team will follow the progress of Todd Mccoy and follow up upon discharge.    Estanislado Emms RN, [name] [place]  [facility] RN Care Coordinator [phone]

## 2023-02-09 NOTE — Care Management Important Message (Signed)
Important Message  Patient Details  Name: Todd Mccoy MRN: [id number] Date of Birth: 1970/10/04   Medicare Important Message Given:  Yes     Dorena Bodo 02/09/2023, 2:42 PM

## 2023-02-09 NOTE — Progress Notes (Signed)
PROGRESS NOTE    Todd Mccoy  YNW:[id number] DOB: [birth year] DOA: 02/04/2023 PCP: [name], MD   Brief Narrative:  Patient 52 year old gentleman history of poorly controlled hypertension, diabetes, HF R EF, CKD stage IV, blindness, PVD status post left BKA presenting to the ED with 3 days of diarrhea, decreased urine output, decreased oral intake. Patient noted to be volume overloaded on examination and hypotensive. Patient noted to be in acute on chronic renal failure requiring CVVHD. Patient also required pressors for shock felt likely combination of hypovolemic and septic shock. Patient received a course of IV antibiotics for possible UTI. Nephrology consulted and following. Cardiology also consulted. Patient noted to have bouts of agitation and noted to be physically and verbally abusive to staff and subsequently placed on a Precedex drip. Precedex drip subsequently weaned off, patient transferred to the floor and to [facility].   Patient tolerated first session of HD well 02/09/23 - pending recovery may need ongoing HD which will dictate his disposition planning.  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 - Patient started on CRRT 02/05/2023 due to volume overload and acidosis. - CRRT discontinued 8 4 - first HD 02/09/23 - Nephrology.   Hypovolemic shock/likely concurrent septic shock, POA, resolved - Patient noted to have presented in shock felt likely multifactorial secondary to hypovolemia and UTI. - Patient admitted to the ICU requiring pressors which have subsequently been discontinued - Blood cultures negative - urine cultures with multiple species  present - Completed 3 days of ceftriaxone.   Acute metabolic encephalopathy, resolved -Patient noted to have some bouts of agitation, noted to be verbally abusive and physically abusive to staff in the ICU. 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 -Per cardiology no beta-blocker due to baseline bradycardia. -Patient currently on Lasix 80 mg IV every 12 hours. -Urine output >1L over past 24h -No cath at this time due to renal disease - repeat 2D echo in 3 months. -Cardiology signed off   Acute metabolic acidosis, resolved -Multifactorial: secondary to AKI on CKD stage V, septic shock -Improved on CRRT/antibiotics   Hypocalcemia -Secondary to above -Appropriate PTH response  Hypertension -Continue hydralazine 25 mg 3 times daily, Imdur 30 mg daily and uptitrate as needed for better blood pressure control.   Diarrhea -Unclear etiology, now resolved.  Diabetes mellitus with hypoglycemia, controlled -Likely secondary to poor oral intake. -Hemoglobin A1c 5.6 (02/06/2023) -Prior hypoglycemia resolved with increased PO intake   Anemia of chronic disease, concurrent iron deficiency -Anemia panel with iron level of 13, TIBC of 197. -Hemoglobin stabile - follow along given recent HD  DVT prophylaxis: heparin injection 5,000 Units Start: 02/05/23 0600   Code Status:   Code Status: Full Code  Family Communication: None present  Status is: Inpt  Dispo: The patient is from: Home              Anticipated d/c is to: Home  Anticipated d/c date is: Pending renal recovery              Patient currently NOT medically stable for discharge  Consultants:  Nephrology, Cardiology  Antimicrobials:  Completed    Subjective: No acute issues/events overnight - he states HD went well without complaints  Objective: Vitals:   02/09/23 0445 02/09/23 0500 02/09/23 0530 02/09/23 0600  BP: (!) 156/91 (!) 159/92 (!) 160/83 (!) 165/85  Pulse:      Resp:      Temp:      TempSrc:      SpO2:      Weight:      Height:        Intake/Output Summary (Last 24 hours) at 02/09/2023 0735 Last data filed at 02/09/2023 0352 Gross per 24 hour  Intake 281.17 ml  Output 850 ml  Net -568.83 ml   Filed Weights   02/06/23 0500 02/07/23 0355 02/08/23 0411  Weight: 101.2 kg 99.4 kg 100.3 kg    Examination:  General exam: Calm and comfortable  Respiratory system: Clear to auscultation. Respiratory effort normal. Cardiovascular system: S1 & S2 heard, RRR. No JVD, murmurs, rubs, gallops or clicks. No pedal edema. Gastrointestinal system: Abdomen is nondistended, soft and nontender. No organomegaly or masses felt. Normal bowel sounds heard. Central nervous system: Alert and oriented. No focal neurological deficits. Extremities: Symmetric 5 x 5 power.  Data Reviewed: I have personally reviewed following labs and imaging studies  CBC: Recent Labs  Lab 02/04/23 2107 02/05/23 0451 02/07/23 0202 02/08/23 0119 02/09/23 0706  WBC 7.0 7.4 7.8 8.3 7.1  NEUTROABS 6.2  --   --   --  PENDING  HGB 8.4* 8.1* 9.0* 7.7* 7.8*  HCT 28.0* 27.4* 29.3* 25.2* 24.8*  MCV 87.8 87.5 84.7 84.0 83.5  PLT 107* 112* PLATELET CLUMPS NOTED ON SMEAR, UNABLE TO ESTIMATE 99* 101*   Basic Metabolic Panel: Recent Labs  Lab 02/05/23 0451 02/05/23 1531 02/06/23 0323 02/06/23 1519 02/06/23 1700 02/07/23 0202 02/07/23 1651 02/08/23 0119 02/08/23 1554  NA 137   < > 136  --  139 137 137 138 139  K 5.9*   < > 4.9  --  4.7 4.9 4.5 4.7 4.9  CL 114*   < > 110  --  109 107 108 107 105  CO2 11*   < > 16*  --  20* 17* 17* 18* 15*  GLUCOSE 141*   < > 83  --  82 81 94 95 152*  BUN 101*   < > 67*  --  57* 59* 61* 62* 66*  CREATININE 8.81*   8.60*   < > 5.25*  --  5.08* 4.86* 5.27* 5.30* 5.43*  CALCIUM 7.5*   < > 7.5*   < > 7.5* 7.5* 7.5* 7.6* 7.5*  MG 1.8  1.7  --  1.9  --   --  2.0  --  1.9  --   PHOS 6.4*   < > 3.4  --  3.1 3.5 4.2 4.1 3.8   < > = values in this interval not displayed.   GFR: Estimated Creatinine Clearance: 19.3 mL/min (A) (by C-G formula based on SCr of 5.43 mg/dL (H)).  Radiology Studies: No results found.  Scheduled Meds:  atorvastatin  20 mg Oral Daily   Chlorhexidine Gluconate Cloth  6 each Topical Q0600   darbepoetin (ARANESP) injection - NON-DIALYSIS  60 mcg Subcutaneous Q Mon-1800   feeding supplement (NEPRO CARB STEADY)  237 mL Oral  TID BM   furosemide  80 mg Intravenous Q12H   heparin  5,000 Units Subcutaneous Q8H   hydrALAZINE  25 mg Oral TID   isosorbide mononitrate  30 mg Oral Daily   multivitamin  1 tablet Oral QHS   mupirocin ointment  1 Application Nasal BID   sodium chloride flush  10-40 mL Intracatheter Q12H   Continuous Infusions:  sodium chloride Stopped (02/07/23 0555)   ferric gluconate (FERRLECIT) IVPB Stopped (02/08/23 1250)     LOS: 4 days   Time spent:  Azucena Fallen, DO [facility]  If 7PM-7AM, please contact night-coverage [email or web address]  02/09/2023, 7:35 AM

## 2023-02-09 NOTE — Progress Notes (Signed)
Todd Mccoy Progress Note   Subjective:   Alert and talkative this AM-  moved out of ICU-  at least 850 of UOP-  ended up getting HD In the middle of the night, no volume removal-  post HD crt 3.8  Objective Vitals:   02/09/23 0500 02/09/23 0530 02/09/23 0600 02/09/23 0815  BP: (!) 159/92 (!) 160/83 (!) 165/85 (!) 154/91  Pulse:    80  Resp:    18  Temp:    98.1 F (36.7 C)  TempSrc:    Oral  SpO2:    100%  Weight:      Height:       Physical Exam General: more alert and talkative Heart: RRR, no rub Lungs: dec BS bases, normal WOB on RA Abdomen: soft, nontender, mildly distended Extremities: 1+  edema, L BKA Dialysis Access: RIJ temp HD cath placed 8/3  Additional Objective Labs: Basic Metabolic Panel: Recent Labs  Lab 02/08/23 0119 02/08/23 1554 02/09/23 0706  NA 138 139 137  K 4.7 4.9 3.8  CL 107 105 105  CO2 18* 15* 22  GLUCOSE 95 152* 74  BUN 62* 66* 42*  CREATININE 5.30* 5.43* 3.82*  CALCIUM 7.6* 7.5* 7.3*  PHOS 4.1 3.8 2.4*   Liver Function Tests: Recent Labs  Lab 02/04/23 2107 02/05/23 1531 02/08/23 0119 02/08/23 1554 02/09/23 0706  AST 11*  --   --   --   --   ALT 12  --   --   --   --   ALKPHOS 123  --   --   --   --   BILITOT 0.6  --   --   --   --   PROT 7.1  --   --   --   --   ALBUMIN 2.7*   < > 2.0* 2.1* 2.0*   < > = values in this interval not displayed.   Recent Labs  Lab 02/04/23 2107  LIPASE 46   CBC: Recent Labs  Lab 02/04/23 2107 02/05/23 0451 02/07/23 0202 02/08/23 0119 02/09/23 0706  WBC 7.0 7.4 7.8 8.3 7.1  NEUTROABS 6.2  --   --   --  6.3  HGB 8.4* 8.1* 9.0* 7.7* 7.8*  HCT 28.0* 27.4* 29.3* 25.2* 24.8*  MCV 87.8 87.5 84.7 84.0 83.5  PLT 107* 112* PLATELET CLUMPS NOTED ON SMEAR, UNABLE TO ESTIMATE 99* 101*   Blood Culture    Component Value Date/Time   SDES BLOOD LEFT HAND 02/06/2023 1545   SDES BLOOD RIGHT HAND 02/06/2023 1545   SPECREQUEST  02/06/2023 1545    BOTTLES DRAWN AEROBIC ONLY  Blood Culture results may not be optimal due to an inadequate volume of blood received in culture bottles   SPECREQUEST  02/06/2023 1545    BOTTLES DRAWN AEROBIC ONLY Blood Culture results may not be optimal due to an inadequate volume of blood received in culture bottles   CULT  02/06/2023 1545    NO GROWTH 3 DAYS Performed at [facility], [address] [place], [name] [address]    CULT  02/06/2023 1545    NO GROWTH 3 DAYS Performed at [facility], [address], [place], [name] [address]    REPTSTATUS PENDING 02/06/2023 1545   REPTSTATUS PENDING 02/06/2023 1545    Cardiac Enzymes: No results for input(s): "CKTOTAL", "CKMB", "CKMBINDEX", "TROPONINI" in the last 168 hours. CBG: Recent Labs  Lab 02/08/23 1144 02/08/23 1705 02/08/23 1953 02/09/23 0136 02/09/23  0814  GLUCAP 81 122* 138* 81 89   Iron Studies:  Recent Labs    02/07/23 0202  IRON 13*  TIBC 197*   @lablastinr3 @ Studies/Results: No results found. Medications:  sodium chloride Stopped (02/07/23 0555)   ferric gluconate (FERRLECIT) IVPB Stopped (02/08/23 1250)    atorvastatin  20 mg Oral Daily   Chlorhexidine Gluconate Cloth  6 each Topical Q0600   darbepoetin (ARANESP) injection - NON-DIALYSIS  60 mcg Subcutaneous Q Mon-1800   feeding supplement (NEPRO CARB STEADY)  237 mL Oral TID BM   furosemide  80 mg Intravenous Q12H   heparin  5,000 Units Subcutaneous Q8H   hydrALAZINE  25 mg Oral TID   isosorbide mononitrate  30 mg Oral Daily   multivitamin  1 tablet Oral QHS   mupirocin ointment  1 Application Nasal BID   sodium chloride flush  10-40 mL Intracatheter Q12H   Assessment/Plan **Shock:  Improved, cultures neg. Off pressors.  Off abx   **AMS:  very agitated requiring precedex initially-  now better     **AKI on advanced CKD: Baseline CKD 5, had missed fu at [facility] since 08/2022.  Reported anuric x 24h prior to admission.  Suspect he's had progression of underlying CKD based in 10/2022  labs.  Started CRRT 8/3 for volume overload and acidosis.   d/c CRRT on 8/4 and plan to  start IHD -- transferred to [facility] for that.    He would accept outpt HD if necessary.    If not recovering pt will need CLIP and perm access.  So crt held  its own albeit not in a great place-  he was not uremic and no acute indications for HD.  Did end up getting HD very early this AM.  Will continue to follow for HD need.  I dont have a great feeling that he will fly off of HD mostly due to social issues ( blind, says unable to ambulate )  still with temp cath -  look to transition to [facility] early next week    **HFrEF: 01/2022 EF 40%, severe RV dysfunction, pericardial effusion w/o tamponade.  Overloaded.  Cardiology following.  challenging with lasix and it is working-  also increase hydralazine for BP control    **Volume:  volume overloaded but on RA, has had UF with CRRT, acheiveing negative balance with lasix   **metabolic acidosis:  secondary to AKI/CKD, improved with RRT and holding   **Anemia:  suspect of CKD, no reported bleeding.  iron indices low-  will give.  started ESA.-     **Thrombocytopenia:  plt ~100, new finding.  Hold heparin with RRT.   Hypoglycemia- better-  now able to eat  Bones-  Phos ok, checking PTH ( pending)      A   02/09/2023, 8:45 AM  Todd Mccoy

## 2023-02-09 NOTE — Plan of Care (Signed)
  Problem: Education: Goal: Ability to describe self-care measures that may prevent or decrease complications (Diabetes Survival Skills Education) will improve Outcome: Progressing Goal: Individualized Educational Video(s) Outcome: Progressing   Problem: Coping: Goal: Ability to adjust to condition or change in health will improve Outcome: Progressing   Problem: Fluid Volume: Goal: Ability to maintain a balanced intake and output will improve Outcome: Progressing   Problem: Health Behavior/Discharge Planning: Goal: Ability to identify and utilize available resources and services will improve Outcome: Progressing Goal: Ability to manage health-related needs will improve Outcome: Progressing   Problem: Metabolic: Goal: Ability to maintain appropriate glucose levels will improve Outcome: Progressing   Problem: Nutritional: Goal: Maintenance of adequate nutrition will improve Outcome: Progressing Goal: Progress toward achieving an optimal weight will improve Outcome: Progressing   Problem: Skin Integrity: Goal: Risk for impaired skin integrity will decrease Outcome: Progressing   Problem: Tissue Perfusion: Goal: Adequacy of tissue perfusion will improve Outcome: Progressing   Problem: Education: Goal: Knowledge of General Education information will improve Description: Including pain rating scale, medication(s)/side effects and non-pharmacologic comfort measures Outcome: Progressing   Problem: Health Behavior/Discharge Planning: Goal: Ability to manage health-related needs will improve Outcome: Progressing   Problem: Clinical Measurements: Goal: Ability to maintain clinical measurements within normal limits will improve Outcome: Progressing Goal: Will remain free from infection Outcome: Progressing Goal: Diagnostic test results will improve Outcome: Progressing Goal: Respiratory complications will improve Outcome: Progressing Goal: Cardiovascular complication will  be avoided Outcome: Progressing   Problem: Activity: Goal: Risk for activity intolerance will decrease Outcome: Progressing   Problem: Nutrition: Goal: Adequate nutrition will be maintained Outcome: Progressing   Problem: Coping: Goal: Level of anxiety will decrease Outcome: Progressing   Problem: Elimination: Goal: Will not experience complications related to bowel motility Outcome: Progressing Goal: Will not experience complications related to urinary retention Outcome: Progressing   Problem: Pain Managment: Goal: General experience of comfort will improve Outcome: Progressing   Problem: Safety: Goal: Ability to remain free from injury will improve Outcome: Progressing   Problem: Skin Integrity: Goal: Risk for impaired skin integrity will decrease Outcome: Progressing   Problem: Education: Goal: Knowledge of disease and its progression will improve Outcome: Progressing Goal: Individualized Educational Video(s) Outcome: Progressing   Problem: Fluid Volume: Goal: Compliance with measures to maintain balanced fluid volume will improve Outcome: Progressing   Problem: Health Behavior/Discharge Planning: Goal: Ability to manage health-related needs will improve Outcome: Progressing   Problem: Nutritional: Goal: Ability to make healthy dietary choices will improve Outcome: Progressing   Problem: Clinical Measurements: Goal: Complications related to the disease process, condition or treatment will be avoided or minimized Outcome: Progressing   Problem: Safety: Goal: Non-violent Restraint(s) Outcome: Progressing

## 2023-02-09 NOTE — Progress Notes (Signed)
Patient cleaned and transported to dialysis.

## 2023-02-10 DIAGNOSIS — R579 Shock, unspecified: Secondary | ICD-10-CM | POA: Diagnosis not present

## 2023-02-10 LAB — GLUCOSE, CAPILLARY
Glucose-Capillary: 95 mg/dL (ref 70–99)
Glucose-Capillary: 80 mg/dL (ref 70–99)
Glucose-Capillary: 126 mg/dL — ABNORMAL HIGH (ref 70–99)
Glucose-Capillary: 145 mg/dL — ABNORMAL HIGH (ref 70–99)
Glucose-Capillary: 124 mg/dL — ABNORMAL HIGH (ref 70–99)
Glucose-Capillary: 213 mg/dL — ABNORMAL HIGH (ref 70–99)

## 2023-02-10 LAB — MAGNESIUM: Magnesium: 1.6 mg/dL — ABNORMAL LOW (ref 1.7–2.4)

## 2023-02-10 MED ORDER — HYDRALAZINE HCL 50 MG PO TABS
50.0000 mg | ORAL_TABLET | Freq: Three times a day (TID) | ORAL | Status: DC
  Administered 2023-02-10 – 2023-02-14 (×14): 50 mg via ORAL
  Filled 2023-02-10 (×15): qty 1

## 2023-02-10 MED ORDER — INSULIN ASPART 100 UNIT/ML IJ SOLN
0.0000 [IU] | Freq: Every day | INTRAMUSCULAR | Status: DC
  Administered 2023-02-10: 2 [IU] via SUBCUTANEOUS

## 2023-02-10 MED ORDER — INSULIN ASPART 100 UNIT/ML IJ SOLN: 0.0000 [IU] | Freq: Three times a day (TID) | INTRAMUSCULAR | Status: DC

## 2023-02-10 NOTE — Progress Notes (Signed)
Sent secure chat to nurse to request a new consult be placed specific to needs for patient. Tomasita Morrow, RN VAST

## 2023-02-10 NOTE — Progress Notes (Signed)
Patient is noncompliant , pulling off telemetry, foley and HD catheter. Refused to have vitals done as well. Made the provider aware. Removed foley per patient request.

## 2023-02-10 NOTE — Progress Notes (Addendum)
Todd NOTE    LEVELLE BALSAMO  WUJ:[id number] DOB: 02-02-1971 DOA: 02/04/2023 PCP: [name], Todd   Brief Narrative:  Patient 52 year old gentleman history of poorly controlled hypertension, diabetes, HF R EF, CKD stage IV, blindness, PVD status post left BKA presenting to the ED with 3 days of diarrhea, decreased urine output, decreased oral intake. Patient noted to be volume overloaded on examination and hypotensive. Patient noted to be in acute on chronic renal failure requiring CVVHD. Patient also required pressors for shock felt likely combination of hypovolemic and septic shock. Patient received a course of IV antibiotics for possible UTI. Nephrology consulted and following. Cardiology also consulted. Patient noted to have bouts of agitation and noted to be physically and verbally abusive to staff and subsequently placed on a Precedex drip. Precedex drip subsequently weaned off, patient transferred to the floor and to [facility].   Patient tolerated first session of HD well 02/09/23 - pending recovery may need ongoing HD which will dictate his disposition planning.  **Patient being markedly noncompliant this afternoon, has now pulled off all telemetry, has been pulling at Foley and has partially removed his HD catheter *dressing* with moderate amount of bleeding.  Staff reported the line had been partially removed - this was not the case, the bandage had been removed only.  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 - Patient started on CRRT 02/05/2023 due to volume overload and acidosis. - CRRT discontinued 8 4 - first HD 02/09/23 - Nephrology following - if  creatinine/BUN continue to rise off HD will likely need to be setup for ongoing dialysis.   Hypovolemic shock/likely concurrent septic shock, POA, resolved - Patient noted to have presented in shock felt likely multifactorial secondary to hypovolemia and UTI. - Patient admitted to the ICU requiring pressors which have subsequently been discontinued - Blood cultures negative - urine cultures with multiple species present - Completed 3 days of ceftriaxone.   Acute metabolic encephalopathy, resolved -Patient noted to have some bouts of agitation, noted to be verbally abusive and physically abusive to staff in the ICU. 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 -Per cardiology no beta-blocker due to baseline bradycardia. -Patient currently on Lasix 80 mg IV every 12 hours. -Urine output >1L over past 24h -No cath at this time due to renal disease - repeat 2D echo in 3 months. -Cardiology signed off   Acute metabolic acidosis, resolved -Multifactorial: secondary to AKI on CKD stage V, septic shock -Improved on CRRT/antibiotics   Hypocalcemia -Secondary to above -Appropriate PTH response  Hypertension -Continue hydralazine 25 mg 3 times daily, Imdur 30 mg daily and uptitrate as needed for better blood pressure control.   Diarrhea -Unclear etiology, now resolved.  Diabetes mellitus with hypoglycemia, controlled -Likely secondary to poor oral intake. -Hemoglobin A1c 5.6 (02/06/2023) -Prior hypoglycemia resolved with increased PO intake   Anemia of chronic disease, concurrent iron deficiency -Anemia panel with iron level of 13, TIBC of 197. -Hemoglobin stabile - follow along  given recent HD  Pain seeking behavior -Patient  today notes 'terrible pain that needs something stronger' but cannot demonstrate what specifically hurts. He ultimately points to his IV, then his elbow which both appear WNL and are unremarkable on exam other than some mild nontender fluctuance at the L olecranon. -Lengthy discussion with patient about appropriateness of narcotic use ongoing.  DVT prophylaxis: heparin injection 5,000 Units Start: 02/05/23 0600   Code Status:   Code Status: Full Code  Family Communication: Mccoy present  Status is: Inpt  Dispo: The patient is from: Home              Anticipated d/c is to: TBD - Possible DC to SNF(per PT) patient wants to go home but it is unclear if that is safe given his comorbidities and limited help at home.              Anticipated d/c date is: Pending renal recovery vs HD initiation              Patient currently NOT medically stable for discharge  Consultants:  Nephrology, Cardiology  Antimicrobials:  Completed   Subjective: No acute issues/events overnight - he now has multiple sites of 'terrible pain' but exam, as below, is unremarkable.  Objective: Vitals:   02/09/23 1603 02/09/23 2027 02/10/23 0440 02/10/23 0500  BP: (!) 165/96 (!) 174/98 (!) 178/95   Pulse: 89 80 81   Resp: 18 18 18    Temp: 98 F (36.7 C) 98.1 F (36.7 C) 97.9 F (36.6 C)   TempSrc: Oral Oral Oral   SpO2: 99% 99% 96%   Weight:    92.7 kg  Height:        Intake/Output Summary (Last 24 hours) at 02/10/2023 0715 Last data filed at 02/10/2023 0458 Gross per 24 hour  Intake 1035 ml  Output 2150 ml  Net -1115 ml   Filed Weights   02/07/23 0355 02/08/23 0411 02/10/23 0500  Weight: 99.4 kg 100.3 kg 92.7 kg    Examination:  General exam: Calm and comfortable  Respiratory system: Clear to auscultation. Respiratory effort normal. Cardiovascular system: S1 & S2 heard, RRR. No JVD, murmurs, rubs, gallops or clicks. No pedal edema. Gastrointestinal system: Abdomen is nondistended, soft and  nontender. No organomegaly or masses felt. Normal bowel sounds heard. Central nervous system: Alert and oriented. No focal neurological deficits. Extremities: Symmetric 5 x 5 power without painful passive/active ROM testing.  Data Reviewed: I have personally reviewed following labs and imaging studies  CBC: Recent Labs  Lab 02/04/23 2107 02/05/23 0451 02/07/23 0202 02/08/23 0119 02/09/23 0706  WBC 7.0 7.4 7.8 8.3 7.1  NEUTROABS 6.2  --   --   --  6.3  HGB 8.4* 8.1* 9.0* 7.7* 7.8*  HCT 28.0* 27.4* 29.3* 25.2* 24.8*  MCV 87.8 87.5 84.7 84.0 83.5  PLT 107* 112* PLATELET CLUMPS NOTED ON SMEAR, UNABLE TO ESTIMATE 99* 101*   Basic Metabolic Panel: Recent Labs  Lab 02/06/23 0323 02/06/23 1519 02/07/23 0202 02/07/23 1651 02/08/23 0119 02/08/23 1554 02/09/23 0706 02/10/23 0044  NA 136   < > 137 137 138 139 137 137  K 4.9   < > 4.9 4.5 4.7 4.9 3.8 4.4  CL 110   < > 107 108 107 105 105 105  CO2 16*   < > 17* 17* 18* 15* 22 22  GLUCOSE 83   < > 81 94 95 152* 74 104*  BUN 67*   < > 59* 61*  62* 66* 42* 49*  CREATININE 5.25*   < > 4.86* 5.27* 5.30* 5.43* 3.82* 4.45*  CALCIUM 7.5*   < > 7.5* 7.5* 7.6*  7.5* 7.5* 7.3* 7.3*  MG 1.9  --  2.0  --  1.9  --  1.6* 1.6*  PHOS 3.4   < > 3.5 4.2 4.1 3.8 2.4* 2.9   < > = values in this interval not displayed.   GFR: Estimated Creatinine Clearance: 22.7 mL/min (A) (by C-G formula based on SCr of 4.45 mg/dL (H)).  Radiology Studies: No results found.  Scheduled Meds:  atorvastatin  20 mg Oral Daily   Chlorhexidine Gluconate Cloth  6 each Topical Q0600   darbepoetin (ARANESP) injection - NON-DIALYSIS  60 mcg Subcutaneous Q Wed-1800   feeding supplement (NEPRO CARB STEADY)  237 mL Oral TID BM   furosemide  80 mg Intravenous Q12H   heparin  5,000 Units Subcutaneous Q8H   hydrALAZINE  25 mg Oral TID   isosorbide mononitrate  30 mg Oral Daily   multivitamin  1 tablet Oral QHS   mupirocin ointment  1 Application Nasal BID   sodium chloride  flush  10-40 mL Intracatheter Q12H   Continuous Infusions:  sodium chloride Stopped (02/07/23 0555)   ferric gluconate (FERRLECIT) IVPB Stopped (02/09/23 1311)     LOS: 5 days   Time spent:  Azucena Fallen, DO [facility]  If 7PM-7AM, please contact night-coverage [email or web address]  02/10/2023, 7:15 AM

## 2023-02-10 NOTE — Progress Notes (Signed)
Occupational Therapy Treatment Patient Details Name: Todd Mccoy MRN: [id number] DOB: 1970/10/13 Today's Date: 02/10/2023   History of present illness 52 yo male admitted 8/2 with weakness and diarrhea. Pt hypothermic with anasarca and acute on chronic renal failure. CRRT 8/3-8/5.  Initiated IHD with pt having this 8/6.  PMhx: legal blindness, poorly controlled HTN, HLD, CHF, Lt BKA, CKD, T2DM, obesity, neuropathy and nephropathy (nephrotic syndrome) and retinopathy.   OT comments  Pt with good progress toward established OT goals. Son present and encouraged son to bring prosthetic for transfer training in future sessions. Pt able to perform bed mobility with mod A to elevate trunk this session. Challenging strength, balance, and functional mobility with lateral scoot to chair and pt performing with min A for cueing in regard to environmental set up and one instance of balance loss. Patient will benefit from continued inpatient follow up therapy, <3 hours/day, however, if family able to assist at home, may go home with [facility].       If plan is discharge home, recommend the following:  A little help with walking and/or transfers;A lot of help with bathing/dressing/bathroom;A little help with bathing/dressing/bathroom;Direct supervision/assist for medications management;Direct supervision/assist for financial management;Help with stairs or ramp for entrance;Assist for transportation   Equipment Recommendations  Other (comment) (defer next venue)    Recommendations for Other Services      Precautions / Restrictions Precautions Precautions: Fall Precaution Comments: blind, L BKA Restrictions Weight Bearing Restrictions: No       Mobility Bed Mobility Overal bed mobility: Needs Assistance Bed Mobility: Supine to Sit     Supine to sit: Mod assist     General bed mobility comments: to come to long sit oin bed but once in long sit able to manage getting BLE to EOB with cues for  location of EOB    Transfers Overall transfer level: Needs assistance Equipment used: None Transfers: Bed to chair/wheelchair/BSC            Lateral/Scoot Transfers: Min assist, Contact guard assist General transfer comment: Min A due to placement of chair and cues for navigating chair prior to transfer. Also light assist for blocking R knee as pt foot sliding when attempting to use to push off from floor     Balance Overall balance assessment: Needs assistance Sitting-balance support: No upper extremity supported, Feet supported, Bilateral upper extremity supported Sitting balance-Leahy Scale: Poor Sitting balance - Comments: min guard A                                   ADL either performed or assessed with clinical judgement   ADL Overall ADL's : Needs assistance/impaired                         Toilet Transfer: Requires drop arm;Contact guard assist;Minimal assistance Toilet Transfer Details (indicate cue type and reason): lateral scoot simulated to drop arm recliner. Min A due to lack of awareness of environment due to blindness.           General ADL Comments: Son present at very beginning and end of session    Extremity/Trunk Assessment Upper Extremity Assessment Upper Extremity Assessment: Generalized weakness   Lower Extremity Assessment Lower Extremity Assessment: Defer to PT evaluation        Vision   Additional Comments: time of events leading to blindness very unclear, but pt currently blind  per pt   Perception     Praxis      Cognition Arousal: Alert Behavior During Therapy: WFL for tasks assessed/performed, Restless Overall Cognitive Status: Impaired/Different from baseline Area of Impairment: Memory, Attention, Following commands, Safety/judgement, Problem solving, Awareness                   Current Attention Level: Sustained Memory: Decreased recall of precautions, Decreased short-term memory Following  Commands: Follows one step commands consistently Safety/Judgement: Decreased awareness of safety, Decreased awareness of deficits Awareness: Emergent Problem Solving: Slow processing, Decreased initiation, Requires verbal cues General Comments: Poor awareness of potential changes in physical abilities from baseline, but following all one step commands with incresaed time and verbal explanation of what is going on around him due to being blind.        Exercises      Shoulder Instructions       General Comments VSS    Pertinent Vitals/ Pain       Pain Assessment Pain Assessment: Faces Faces Pain Scale: Hurts little more Pain Location: abdomen Pain Descriptors / Indicators: Grimacing Pain Intervention(s): Limited activity within patient's tolerance, Monitored during session  Home Living                                          Prior Functioning/Environment              Frequency  Min 1X/week        Progress Toward Goals  OT Goals(current goals can now be found in the care plan section)  Progress towards OT goals: Progressing toward goals  Acute Rehab OT Goals Patient Stated Goal: go home OT Goal Formulation: With patient Time For Goal Achievement: 02/22/23 Potential to Achieve Goals: Fair ADL Goals Pt Will Perform Lower Body Dressing: with min assist;sit to/from stand;sitting/lateral leans Pt Will Transfer to Toilet: with min assist;bedside commode;squat pivot transfer Pt Will Perform Toileting - Clothing Manipulation and hygiene: with contact guard assist;sitting/lateral leans Additional ADL Goal #1: pt will complete bed mobility with min A as a precursor to ADLs  Plan      Co-evaluation                 AM-PAC OT "6 Clicks" Daily Activity     Outcome Measure   Help from another person eating meals?: A Little Help from another person taking care of personal grooming?: A Little Help from another person toileting, which includes  using toliet, bedpan, or urinal?: A Lot Help from another person bathing (including washing, rinsing, drying)?: A Lot Help from another person to put on and taking off regular upper body clothing?: A Little Help from another person to put on and taking off regular lower body clothing?: A Lot 6 Click Score: 15    End of Session    OT Visit Diagnosis: Unsteadiness on feet (R26.81);Other abnormalities of gait and mobility (R26.89);Muscle weakness (generalized) (M62.81);Pain   Activity Tolerance Patient tolerated treatment well   Patient Left in chair;with call bell/phone within reach;with family/visitor present (son present and pt and son able to verbalize need to call RN prior to any mobility.)   Nurse Communication Mobility status (no chair alarm box; son and pt able to verbalize need to call RN prior to transfers)        Time: 9528-4132 OT Time Calculation (min): 19 min  Charges: OT General Charges $  OT Visit: 1 Visit OT Treatments $Therapeutic Activity: 8-22 mins  Tyler Deis, OTR/L [facility] Acute Rehabilitation Office: [phone]   Myrla Halsted 02/10/2023, 5:50 PM

## 2023-02-10 NOTE — Progress Notes (Signed)
[facility] Progress Note   Subjective:   Alert and talkative again this AM, eating breakfast-   2150 of UOP-  crt 3.8---4.4 off of HD  Objective Vitals:   02/09/23 1603 02/09/23 2027 02/10/23 0440 02/10/23 0500  BP: (!) 165/96 (!) 174/98 (!) 178/95   Pulse: 89 80 81   Resp: 18 18 18    Temp: 98 F (36.7 C) 98.1 F (36.7 C) 97.9 F (36.6 C)   TempSrc: Oral Oral Oral   SpO2: 99% 99% 96%   Weight:    92.7 kg  Height:       Physical Exam General: more alert and talkative Heart: RRR, no rub Lungs: dec BS bases, normal WOB on RA Abdomen: soft, nontender, mildly distended Extremities: trace to 1+  edema ( better) , L BKA Dialysis Access: RIJ temp HD cath placed 8/3  Additional Objective Labs: Basic Metabolic Panel: Recent Labs  Lab 02/08/23 1554 02/09/23 0706 02/10/23 0044  NA 139 137 137  K 4.9 3.8 4.4  CL 105 105 105  CO2 15* 22 22  GLUCOSE 152* 74 104*  BUN 66* 42* 49*  CREATININE 5.43* 3.82* 4.45*  CALCIUM 7.5* 7.3* 7.3*  PHOS 3.8 2.4* 2.9   Liver Function Tests: Recent Labs  Lab 02/04/23 2107 02/05/23 1531 02/08/23 1554 02/09/23 0706 02/10/23 0044  AST 11*  --   --   --   --   ALT 12  --   --   --   --   ALKPHOS 123  --   --   --   --   BILITOT 0.6  --   --   --   --   PROT 7.1  --   --   --   --   ALBUMIN 2.7*   < > 2.1* 2.0* 2.0*   < > = values in this interval not displayed.   Recent Labs  Lab 02/04/23 2107  LIPASE 46   CBC: Recent Labs  Lab 02/04/23 2107 02/05/23 0451 02/07/23 0202 02/08/23 0119 02/09/23 0706  WBC 7.0 7.4 7.8 8.3 7.1  NEUTROABS 6.2  --   --   --  6.3  HGB 8.4* 8.1* 9.0* 7.7* 7.8*  HCT 28.0* 27.4* 29.3* 25.2* 24.8*  MCV 87.8 87.5 84.7 84.0 83.5  PLT 107* 112* PLATELET CLUMPS NOTED ON SMEAR, UNABLE TO ESTIMATE 99* 101*   Blood Culture    Component Value Date/Time   SDES BLOOD LEFT HAND 02/06/2023 1545   SDES BLOOD RIGHT HAND 02/06/2023 1545   SPECREQUEST  02/06/2023 1545    BOTTLES DRAWN AEROBIC  ONLY Blood Culture results may not be optimal due to an inadequate volume of blood received in culture bottles   SPECREQUEST  02/06/2023 1545    BOTTLES DRAWN AEROBIC ONLY Blood Culture results may not be optimal due to an inadequate volume of blood received in culture bottles   CULT  02/06/2023 1545    NO GROWTH 3 DAYS Performed at [facility], [address] [place], [name] [address]    CULT  02/06/2023 1545    NO GROWTH 3 DAYS Performed at [facility], [address], [place], [name] [address]    REPTSTATUS PENDING 02/06/2023 1545   REPTSTATUS PENDING 02/06/2023 1545    Cardiac Enzymes: No results for input(s): "CKTOTAL", "CKMB", "CKMBINDEX", "TROPONINI" in the last 168 hours. CBG: Recent Labs  Lab 02/09/23 1201 02/09/23 1559 02/09/23 2025 02/10/23 0114 02/10/23 0436  GLUCAP 130* 109*  164* 95 80   Iron Studies:  No results for input(s): "IRON", "TIBC", "TRANSFERRIN", "FERRITIN" in the last 72 hours.  @lablastinr3 @ Studies/Results: No results found. Medications:  sodium chloride Stopped (02/07/23 0555)   ferric gluconate (FERRLECIT) IVPB Stopped (02/09/23 1311)    atorvastatin  20 mg Oral Daily   Chlorhexidine Gluconate Cloth  6 each Topical Q0600   darbepoetin (ARANESP) injection - NON-DIALYSIS  60 mcg Subcutaneous Q Wed-1800   feeding supplement (NEPRO CARB STEADY)  237 mL Oral TID BM   furosemide  80 mg Intravenous Q12H   heparin  5,000 Units Subcutaneous Q8H   hydrALAZINE  25 mg Oral TID   isosorbide mononitrate  30 mg Oral Daily   multivitamin  1 tablet Oral QHS   mupirocin ointment  1 Application Nasal BID   sodium chloride flush  10-40 mL Intracatheter Q12H   Assessment/Plan **Shock:  Improved, cultures neg. Off pressors.  Off abx     **AKI on advanced CKD: Baseline CKD 5, had missed fu at [facility] since 08/2022.  Reported anuric x 24h prior to admission.  Suspect he's had progression of underlying CKD based in 10/2022 labs.  Started CRRT 8/3 for  volume overload and acidosis.   d/c CRRT on 8/4 and plan to  start IHD -- transferred to [facility] for that.    He would accept outpt HD if necessary.    If not recovering pt will need CLIP and perm access.  So crt held its own albeit not in a great place-  he was not uremic and no acute indications for HD.  Did end up getting HD very early 8/7 AM.  Crt rose today off of HD-   if rises again tomorrow I think will commit him to dialysis, but is stable or better we may be able to follow him as OP.  I dont have a great feeling that he will fly off of HD mostly due to social issues ( blind, says unable to ambulate )  still with temp cath -  disposition on this pending   **HFrEF: 01/2022 EF 40%, severe RV dysfunction, pericardial effusion w/o tamponade.  Overloaded.  Cardiology following.  challenging with lasix and it is working-  also increased hydralazine for BP control -  will inc again   **Volume:  volume overloaded but on RA, has had UF with CRRT, acheiveing negative balance with lasix   **metabolic acidosis:  secondary to AKI/CKD, improved with RRT and holding   **Anemia:  suspect of CKD, no reported bleeding.  iron indices low-  will give.  started ESA.-     **Thrombocytopenia:  plt ~100, new finding.  stable  Hypoglycemia- better-  now able to eat  Bones-  Phos ok, PTH 157-  no action needed      Cecille Aver  02/10/2023, 7:33 AM  [employer]

## 2023-02-10 NOTE — Progress Notes (Signed)
TR[facility] night cross cover note:   I was notified by RN of the patient's evening blood sugar this evening of 213, currently without any nightly sliding scale insulin coverage ordered.  I subsequently modified existing Accu-Cheks/sliding scale insulin coverage to include nightly sliding scale insulin coverage.     Newton Pigg, DO Hospitalist

## 2023-02-10 NOTE — Progress Notes (Signed)
Patient screaming out into hallway from room for his niece. RN informed patient that his niece is not here and that it is inappropriate to scream out and to utilize his call bell.  Patient found to have ripped IV line in half during iron infusion. PIV still in place.   Patient alert and oriented at this time.

## 2023-02-11 ENCOUNTER — Inpatient Hospital Stay (HOSPITAL_COMMUNITY): Payer: Medicare Other

## 2023-02-11 DIAGNOSIS — R579 Shock, unspecified: Secondary | ICD-10-CM | POA: Diagnosis not present

## 2023-02-11 HISTORY — PX: IR US GUIDE VASC ACCESS RIGHT: IMG2390

## 2023-02-11 HISTORY — PX: IR FLUORO GUIDE CV LINE RIGHT: IMG2283

## 2023-02-11 LAB — GLUCOSE, CAPILLARY
Glucose-Capillary: 108 mg/dL — ABNORMAL HIGH (ref 70–99)
Glucose-Capillary: 123 mg/dL — ABNORMAL HIGH (ref 70–99)
Glucose-Capillary: 124 mg/dL — ABNORMAL HIGH (ref 70–99)
Glucose-Capillary: 137 mg/dL — ABNORMAL HIGH (ref 70–99)
Glucose-Capillary: 137 mg/dL — ABNORMAL HIGH (ref 70–99)
Glucose-Capillary: 76 mg/dL (ref 70–99)

## 2023-02-11 LAB — RENAL FUNCTION PANEL
Albumin: 2.1 g/dL — ABNORMAL LOW (ref 3.5–5.0)
Anion gap: 13 (ref 5–15)
BUN: 59 mg/dL — ABNORMAL HIGH (ref 6–20)
CO2: 22 mmol/L (ref 22–32)
Calcium: 7.6 mg/dL — ABNORMAL LOW (ref 8.9–10.3)
Chloride: 103 mmol/L (ref 98–111)
Creatinine, Ser: 5.48 mg/dL — ABNORMAL HIGH (ref 0.61–1.24)
GFR, Estimated: 12 mL/min — ABNORMAL LOW (ref >60)
Glucose, Bld: 151 mg/dL — ABNORMAL HIGH (ref 70–99)
Phosphorus: 2.8 mg/dL (ref 2.5–4.6)
Potassium: 4.7 mmol/L (ref 3.5–5.1)
Sodium: 138 mmol/L (ref 135–145)

## 2023-02-11 LAB — CBC
HCT: 25.1 % — ABNORMAL LOW (ref 39.0–52.0)
Hemoglobin: 7.6 g/dL — ABNORMAL LOW (ref 13.0–17.0)
MCH: 26.2 pg (ref 26.0–34.0)
MCHC: 30.3 g/dL (ref 30.0–36.0)
MCV: 86.6 fL (ref 80.0–100.0)
Platelets: 92 10*3/uL — ABNORMAL LOW (ref 150–400)
RBC: 2.9 MIL/uL — ABNORMAL LOW (ref 4.22–5.81)
RDW: 20.9 % — ABNORMAL HIGH (ref 11.5–15.5)
WBC: 6.2 10*3/uL (ref 4.0–10.5)
nRBC: 0.3 % — ABNORMAL HIGH (ref 0.0–0.2)

## 2023-02-11 LAB — CULTURE, BLOOD (ROUTINE X 2)
Culture: NO GROWTH
Culture: NO GROWTH

## 2023-02-11 LAB — MAGNESIUM: Magnesium: 1.6 mg/dL — ABNORMAL LOW (ref 1.7–2.4)

## 2023-02-11 MED ORDER — LIDOCAINE HCL (PF) 1 % IJ SOLN: 5.0000 mL | INTRAMUSCULAR | Status: DC | PRN

## 2023-02-11 MED ORDER — LIDOCAINE-PRILOCAINE 2.5-2.5 % EX CREA: 1.0000 | TOPICAL_CREAM | CUTANEOUS | Status: DC | PRN

## 2023-02-11 MED ORDER — PENTAFLUOROPROP-TETRAFLUOROETH EX AERO: 1.0000 | INHALATION_SPRAY | CUTANEOUS | Status: DC | PRN

## 2023-02-11 MED ORDER — HEPARIN SODIUM (PORCINE) 1000 UNIT/ML IJ SOLN
INTRAMUSCULAR | Status: AC
  Filled 2023-02-11: qty 10

## 2023-02-11 MED ORDER — CEFAZOLIN SODIUM-DEXTROSE 2-4 GM/100ML-% IV SOLN
INTRAVENOUS | Status: AC
  Filled 2023-02-11: qty 100

## 2023-02-11 MED ORDER — MIDAZOLAM HCL 2 MG/2ML IJ SOLN
INTRAMUSCULAR | Status: AC | PRN
  Administered 2023-02-11: .5 mg via INTRAVENOUS

## 2023-02-11 MED ORDER — HEPARIN SODIUM (PORCINE) 1000 UNIT/ML DIALYSIS: 1000.0000 [IU] | INTRAMUSCULAR | Status: DC | PRN

## 2023-02-11 MED ORDER — CEFAZOLIN SODIUM-DEXTROSE 2-4 GM/100ML-% IV SOLN
INTRAVENOUS | Status: AC | PRN
  Administered 2023-02-11: 2 g via INTRAVENOUS

## 2023-02-11 MED ORDER — MIDAZOLAM HCL 2 MG/2ML IJ SOLN
INTRAMUSCULAR | Status: AC
  Filled 2023-02-11: qty 2

## 2023-02-11 MED ORDER — CHLORHEXIDINE GLUCONATE CLOTH 2 % EX PADS
6.0000 | MEDICATED_PAD | Freq: Every day | CUTANEOUS | Status: DC
  Administered 2023-02-11 – 2023-02-14 (×4): 6 via TOPICAL

## 2023-02-11 MED ORDER — ALTEPLASE 2 MG IJ SOLR: 2.0000 mg | Freq: Once | INTRAMUSCULAR | Status: DC | PRN

## 2023-02-11 MED ORDER — GELATIN ABSORBABLE 12-7 MM EX MISC
CUTANEOUS | Status: AC
  Filled 2023-02-11: qty 1

## 2023-02-11 MED ORDER — LIDOCAINE HCL 1 % IJ SOLN
INTRAMUSCULAR | Status: AC
  Filled 2023-02-11: qty 20

## 2023-02-11 MED ORDER — FENTANYL CITRATE (PF) 100 MCG/2ML IJ SOLN
INTRAMUSCULAR | Status: AC | PRN
  Administered 2023-02-11: 25 ug via INTRAVENOUS

## 2023-02-11 MED ORDER — ANTICOAGULANT SODIUM CITRATE 4% (200MG/5ML) IV SOLN: 5.0000 mL | Status: DC | PRN

## 2023-02-11 MED ORDER — MAGNESIUM SULFATE IN D5W 1-5 GM/100ML-% IV SOLN
1.0000 g | Freq: Once | INTRAVENOUS | Status: AC
  Administered 2023-02-11: 1 g via INTRAVENOUS
  Filled 2023-02-11: qty 100

## 2023-02-11 MED ORDER — FENTANYL CITRATE (PF) 100 MCG/2ML IJ SOLN
INTRAMUSCULAR | Status: AC
  Filled 2023-02-11: qty 2

## 2023-02-11 NOTE — Consult Note (Signed)
Chief Complaint: Patient was seen in consultation today for tunneled dialysis catheter placement Chief Complaint  Patient presents with   Abdominal Pain   Weakness   at the request of Dr Ruben Reason   Supervising Physician: Richarda Overlie  Patient Status: [facility] - In-pt  History of Present Illness: CUAUHTEMOC DEBERARDINIS is a 52 y.o. male   FULL Code status per chart Pt to ED 8/3 with complaints of diarrhea; decreased UOP Hx HTN; DM; CHF Blindness CKD4; PVD-- L BKA Hypovolemic shock in ED Agitation; sometimes combative per chart Temp cath for dialysis placed in ED with PCCM- R internal jugular  First Hemodialysis 8/7 Afeb Wbc wnl Cxs neg; off pressors  DR Kathrene Bongo note today:  **AKI on advanced CKD: Baseline CKD 5, had missed fu at [facility] since 08/2022.  Reported anuric x 24h prior to admission.  Suspect he's had progression of underlying CKD based in 10/2022 labs.  Started CRRT 8/3 for volume overload and acidosis.   d/c CRRT on 8/4 and plan to  start IHD -- transferred to [facility] for that.    He would accept outpt HD if necessary.    If not recovering pt will need CLIP and perm access.  So crt held its own albeit not in a great place-  he was not uremic and no acute indications for HD.  Did end up getting HD very early 8/7 AM.  Crt rose off of HD now for 48 hours-   will commit him to dialysis- is now ESRD.   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/2020    pt states recent MD visit /left eye worse than right   Dyslipidemia 12/10/2020   Hx of BKA, left (HCC)    Hypertension    Morbid obesity (HCC)    Osteomyelitis (HCC)    Pneumonia     Past Surgical History:  Procedure Laterality Date   ABSCESS DRAINAGE     neck   AMPUTATION Left 11/14/2020   Procedure: LEFT 5TH RAY AMPUTATION;  Surgeon: Nadara Mustard, MD;  Location: [facility] OR;  Service: Orthopedics;  Laterality: Left;   AMPUTATION Left 12/10/2020   Procedure: LEFT BELOW KNEE AMPUTATION;  Surgeon: Nadara Mustard, MD;  Location: [facility] OR;  Service: Orthopedics;  Laterality: Left;   AMPUTATION TOE Right 03/25/2020   Procedure: AMPUTATION TOE;  Surgeon: Park Liter, DPM;  Location: [facility] ORS;  Service: Podiatry;  Laterality: Right;   INCISION AND DRAINAGE ABSCESS Right 09/07/2014   Procedure: INCISION AND DRAINAGE ABSCESS RIGHT FLANK;  Surgeon: Avel Peace, MD;  Location: [facility] ORS;  Service: General;  Laterality: Right;   LEG AMPUTATION BELOW KNEE Left 12/10/2020   SCROTUM EXPLORATION      Allergies: Patient has no known allergies.  Medications: Prior to Admission medications   Medication Sig Start Date End Date Taking? Authorizing Provider  calcitRIOL (ROCALTROL) 0.25 MCG capsule Take  0.25 mcg by mouth daily. 11/07/22  Yes [provider]  carvedilol (COREG) 25 MG tablet Take 1.5 tablets (37.5 mg total) by mouth 2 (two) times daily. 06/17/22  Yes Gaston Islam., NP  acyclovir ointment (ZOVIRAX) 5 % Apply 1 Application topically every 3 (three) hours. 10/09/22   Rondel Baton, MD  amLODipine (NORVASC) 10 MG tablet TAKE ONE TABLET BY MOUTH ONCE DAILY 10/15/22   Quintella Reichert, MD  atorvastatin (LIPITOR) 20 MG tablet Take 1 tablet (20 mg total) by mouth daily. 06/14/22   Quintella Reichert, MD  cetirizine (ZYRTEC ALLERGY) 10 MG tablet Take 1 tablet (10 mg total) by mouth daily. 10/09/22 11/08/22  Rondel Baton, MD  doxycycline (MONODOX) 50 MG capsule Take 50 mg by mouth 2 (two)  times daily. 12/20/22   [provider]  ferrous sulfate 325 (65 FE) MG EC tablet Take 325 mg by mouth daily with breakfast.    [provider]  hydrALAZINE (APRESOLINE) 100 MG tablet Take 1 tablet (100 mg total) by mouth 2 (two) times daily. 09/21/22   Gaston Islam., NP  hydrOXYzine (ATARAX) 25 MG tablet Take 1 tablet (25 mg total) by mouth at bedtime as needed. Patient not taking: Reported on 01/03/2023 02/24/22   [name], MD  ibuprofen (ADVIL) 600 MG tablet Take 1 tablet (600 mg total) by mouth every 6 (six) hours as needed. 03/11/22   Fayrene Helper, PA-C  insulin glargine (LANTUS) 100 UNIT/ML Solostar Pen Inject 10 Units into the skin at bedtime. 02/24/22 02/24/23  [name], MD  isosorbide mononitrate (IMDUR) 60 MG 24 hr tablet TAKE ONE TABLET BY MOUTH ONCE DAILY Patient not taking: Reported on 02/05/2023 08/19/22   Quintella Reichert, MD  MAGNESIUM-OXIDE 400 (240 Mg) MG tablet Take 1 tablet by mouth daily. 11/07/22   [provider]  Naphazoline HCl (CLEAR EYES OP) Place 1 drop into both eyes as needed (irritation).    [provider]  pregabalin (LYRICA) 50 MG capsule Take 1 capsule by mouth twice daily 01/11/23   [name], MD  Torsemide 40 MG TABS Take 80 mg (2 tablets) by mouth once a day 10/06/22   Gaston Islam., NP  insulin aspart protamine- aspart (NOVOLOG MIX 70/30) (70-30) 100 UNIT/ML injection Inject 0.35 mLs (35 Units total) into the skin 2 (two) times daily. 04/17/20 05/13/20  Anders Simmonds, PA-C  metFORMIN (GLUCOPHAGE-XR) 500 MG 24 hr tablet TAKE 1 TABLET (500 MG TOTAL) BY MOUTH 2 (TWO) TIMES DAILY. 12/19/20 12/22/20  Kendell Bane, MD     Family History  Problem Relation Age of Onset   Diabetes Mother    Diabetes Father    Diabetes Brother    Colon cancer Neg Hx    Esophageal cancer Neg Hx    Pancreatic cancer Neg Hx    Stomach cancer Neg Hx    Liver disease Neg Hx    CAD Neg Hx     Social History   Socioeconomic  History   Marital status: Single    Spouse name: Not on file   Number of children: 1   Years of education: Not on file   Highest education level: Not on file  Occupational History   Not on file  Tobacco Use   Smoking status: Every Day    Current packs/day: 0.00    Average packs/day: 0.5 packs/day for 32.0 years (16.0 ttl pk-yrs)    Types: Cigarettes    Start  date: 08/24/1989    Last attempt to quit: 08/24/2021    Years since quitting: 1.4    Passive exposure: Past   Smokeless tobacco: Former   Tobacco comments:    Quit smoking previously February 2023  Vaping Use   Vaping status: Never Used  Substance and Sexual Activity   Alcohol use: No   Drug use: No   Sexual activity: Not on file  Other Topics Concern   Not on file  Social History Narrative   Not on file   Social Determinants of Health   Financial Resource Strain: Low Risk  (09/30/2021)   Overall Financial Resource Strain (CARDIA)    Difficulty of Paying Living Expenses: Not hard at all  Food Insecurity: Patient Unable To Answer (02/06/2023)   Hunger Vital Sign    Worried About Running Out of Food in the Last Year: Patient unable to answer    Ran Out of Food in the Last Year: Patient unable to answer  Transportation Needs: Patient Unable To Answer (02/06/2023)   PRAPARE - Transportation    Lack of Transportation (Medical): Patient unable to answer    Lack of Transportation (Non-Medical): Patient unable to answer  Physical Activity: Inactive (09/30/2021)   Exercise Vital Sign    Days of Exercise per Week: 0 days    Minutes of Exercise per Session: 0 min  Stress: No Stress Concern Present (09/30/2021)   [employer] of Occupational Health - Occupational Stress Questionnaire    Feeling of Stress : Only a little  Social Connections: Moderately Isolated (09/30/2021)   Social Connection and Isolation Panel [NHANES]    Frequency of Communication with Friends and Family: More than three times a week    Frequency of Social  Gatherings with Friends and Family: More than three times a week    Attends Religious Services: 1 to 4 times per year    Active Member of [employer] or Organizations: No    Attends Banker Meetings: Never    Marital Status: Never married     Review of Systems: A 12 point ROS discussed and pertinent positives are indicated in the HPI above.  All other systems are negative.    Vital Signs: BP (!) 160/93 (BP Location: Right Arm)   Pulse 92   Temp (!) 97.4 F (36.3 C)   Resp 14   Ht 5\' 10"  (1.778 m)   Wt 204 lb 5.9 oz (92.7 kg)   SpO2 100%   BMI 29.32 kg/m     Physical Exam Vitals reviewed.  Cardiovascular:     Rate and Rhythm: Normal rate and regular rhythm.  Pulmonary:     Effort: Pulmonary effort is normal.     Breath sounds: Normal breath sounds. No wheezing.  Abdominal:     Tenderness: There is no abdominal tenderness.  Skin:    General: Skin is warm.  Neurological:     Mental Status: He is alert and oriented to person, place, and time.  Psychiatric:        Behavior: Behavior normal.     Imaging: ECHOCARDIOGRAM COMPLETE  Result Date: 02/05/2023    ECHOCARDIOGRAM REPORT   Patient Name:   KIEFER SICKLES Date of Exam: 02/05/2023 Medical Rec #:  [id number]           Height:       70.0 in Accession #:    [id number]          Weight:       231.5 lb Date  of Birth:  09/27/1970           BSA:          2.221 m Patient Age:    52 years            BP:           128/81 mmHg Patient Gender: M                   HR:           67 bpm. Exam Location:  Inpatient Procedure: 2D Echo, Cardiac Doppler and Color Doppler STAT ECHO Indications:     Dilated cardiomyopathy; pericardial effusion  History:         Patient has prior history of Echocardiogram examinations, most                  recent 01/23/2022. CHF, CKD and PAD, Signs/Symptoms:anasarca;                  Risk Factors:Dyslipidemia, Diabetes, Hypertension and Current                  Smoker. Shock, abscesses, sepsis,  cellulitis.  Sonographer:     Wallie Char Referring Phys:  [id number] [name] Diagnosing Phys: Weston Brass MD  Sonographer Comments: Messaged MD @ 8:48am IMPRESSIONS  1. Left ventricular ejection fraction, by estimation, is 40 to 45%. The left ventricle has mildly decreased function. The left ventricle demonstrates global hypokinesis. There is moderate left ventricular hypertrophy. Left ventricular diastolic parameters are consistent with Grade III diastolic dysfunction (restrictive).  2. Right ventricular systolic function is moderately reduced. The right ventricular size is moderately enlarged. There is severely elevated pulmonary artery systolic pressure. The estimated right ventricular systolic pressure is 67.7 mmHg.  3. Left atrial size was moderately dilated.  4. Right atrial size was moderately dilated.  5. Moderate pericardial effusion. The pericardial effusion is circumferential. There is no definite evidence of cardiac tamponade by echocardiogram (BP and HR noted above).  6. The mitral valve is normal in structure. Trivial mitral valve regurgitation. No evidence of mitral stenosis.  7. The aortic valve is tricuspid. Aortic valve regurgitation is not visualized. No aortic stenosis is present.  8. The inferior vena cava is dilated in size with <50% respiratory variability, suggesting right atrial pressure of 15 mmHg. FINDINGS  Left Ventricle: Left ventricular ejection fraction, by estimation, is 40 to 45%. The left ventricle has mildly decreased function. The left ventricle demonstrates global hypokinesis. The left ventricular internal cavity size was normal in size. There is  moderate left ventricular hypertrophy. Left ventricular diastolic parameters are consistent with Grade III diastolic dysfunction (restrictive). Right Ventricle: The right ventricular size is moderately enlarged. No increase in right ventricular wall thickness. Right ventricular systolic function is moderately reduced. There  is severely elevated pulmonary artery systolic pressure. The tricuspid regurgitant velocity is 3.63 m/s, and with an assumed right atrial pressure of 15 mmHg, the estimated right ventricular systolic pressure is 67.7 mmHg. Left Atrium: Left atrial size was moderately dilated. Right Atrium: Right atrial size was moderately dilated. Pericardium: A moderately sized pericardial effusion is present. The pericardial effusion is circumferential. There is no evidence of cardiac tamponade. Mitral Valve: The mitral valve is normal in structure. Trivial mitral valve regurgitation. No evidence of mitral valve stenosis. MV peak gradient, 2.7 mmHg. The mean mitral valve gradient is 1.0 mmHg. Tricuspid Valve: The tricuspid valve is normal in structure. Tricuspid valve regurgitation is mild .  No evidence of tricuspid stenosis. Aortic Valve: The aortic valve is tricuspid. Aortic valve regurgitation is not visualized. No aortic stenosis is present. Aortic valve mean gradient measures 3.0 mmHg. Aortic valve peak gradient measures 6.9 mmHg. Aortic valve area, by VTI measures 3.36 cm. Pulmonic Valve: The pulmonic valve was normal in structure. Pulmonic valve regurgitation is mild. No evidence of pulmonic stenosis. Aorta: The aortic root is normal in size and structure. Venous: The inferior vena cava is dilated in size with less than 50% respiratory variability, suggesting right atrial pressure of 15 mmHg. IAS/Shunts: The interatrial septum was not assessed.  LEFT VENTRICLE PLAX 2D LVIDd:         4.70 cm      Diastology LVIDs:         3.90 cm      LV e' medial:    3.57 cm/s LV PW:         1.10 cm      LV E/e' medial:  24.2 LV IVS:        1.40 cm      LV e' lateral:   5.18 cm/s LVOT diam:     2.30 cm      LV E/e' lateral: 16.7 LV SV:         96 LV SV Index:   43 LVOT Area:     4.15 cm  LV Volumes (MOD) LV vol d, MOD A2C: 126.0 ml LV vol d, MOD A4C: 118.0 ml LV vol s, MOD A2C: 80.9 ml LV vol s, MOD A4C: 67.4 ml LV SV MOD A2C:     45.1 ml  LV SV MOD A4C:     118.0 ml LV SV MOD BP:      48.8 ml RIGHT VENTRICLE            IVC RV Basal diam:  4.80 cm    IVC diam: 2.70 cm RV S prime:     5.77 cm/s TAPSE (M-mode): 1.1 cm LEFT ATRIUM             Index        RIGHT ATRIUM           Index LA diam:        5.50 cm 2.48 cm/m   RA Area:     26.60 cm LA Vol (A2C):   97.0 ml 43.67 ml/m  RA Volume:   94.80 ml  42.68 ml/m LA Vol (A4C):   98.2 ml 44.21 ml/m LA Biplane Vol: 99.7 ml 44.89 ml/m  AORTIC VALVE AV Area (Vmax):    3.52 cm AV Area (Vmean):   3.56 cm AV Area (VTI):     3.36 cm AV Vmax:           131.00 cm/s AV Vmean:          82.850 cm/s AV VTI:            0.284 m AV Peak Grad:      6.9 mmHg AV Mean Grad:      3.0 mmHg LVOT Vmax:         111.00 cm/s LVOT Vmean:        71.050 cm/s LVOT VTI:          0.230 m LVOT/AV VTI ratio: 0.81  AORTA Ao Root diam: 3.30 cm Ao Asc diam:  3.60 cm MITRAL VALVE               TRICUSPID VALVE MV Area (PHT): 2.93 cm  TR Peak grad:   52.7 mmHg MV Area VTI:   3.57 cm    TR Vmax:        363.00 cm/s MV Peak grad:  2.7 mmHg MV Mean grad:  1.0 mmHg    SHUNTS MV Vmax:       0.83 m/s    Systemic VTI:  0.23 m MV Vmean:      45.6 cm/s   Systemic Diam: 2.30 cm MV Decel Time: 259 msec MV E velocity: 86.30 cm/s MV A velocity: 37.20 cm/s MV E/A ratio:  2.32 Weston Brass MD Electronically signed by Weston Brass MD Signature Date/Time: 02/05/2023/9:03:51 AM    Final (Updated)    DG Chest Port 1 View  Result Date: 02/05/2023 CLINICAL DATA:  Central line placement EXAM: PORTABLE CHEST 1 VIEW COMPARISON:  02/05/2023, 12:47 a.m. FINDINGS: Gross cardiomegaly. Diffuse bilateral interstitial pulmonary opacity. Interval placement right neck multi lumen vascular catheter, tip near the superior cavoatrial junction. Osseous structures unremarkable. IMPRESSION: 1. Interval placement right neck multi lumen vascular catheter, tip near the superior cavoatrial junction. 2. Gross cardiomegaly and diffuse bilateral interstitial pulmonary  opacity, most consistent with edema. Electronically Signed   By: Jearld Lesch M.D.   On: 02/05/2023 07:49   DG Chest Portable 1 View  Result Date: 02/05/2023 CLINICAL DATA:  Weakness, shortness of breath EXAM: PORTABLE CHEST 1 VIEW COMPARISON:  02/21/2022 FINDINGS: Cardiomegaly, vascular congestion. Very low lung volumes. Diffuse bilateral airspace disease, right greater than left. No visible effusions or pneumothorax. No acute bony abnormality. IMPRESSION: Cardiomegaly with diffuse bilateral airspace disease, right greater than left. Favor edema although infection cannot be excluded. Electronically Signed   By: Charlett Nose M.D.   On: 02/05/2023 01:05   CT Renal Stone Study  Result Date: 02/04/2023 CLINICAL DATA:  Left lower quadrant pain, diarrhea, and abdominal distention. EXAM: CT ABDOMEN AND PELVIS WITHOUT CONTRAST TECHNIQUE: Multidetector CT imaging of the abdomen and pelvis was performed following the standard protocol without IV contrast. RADIATION DOSE REDUCTION: This exam was performed according to the departmental dose-optimization program which includes automated exposure control, adjustment of the mA and/or kV according to patient size and/or use of iterative reconstruction technique. COMPARISON:  09/07/2014. FINDINGS: Lower chest: The heart is enlarged and there is a moderate pericardial effusion measuring up to 1.8 cm. There are small bilateral pleural effusions, greater on the right than on the left. Atelectasis is present at the lung bases. Hepatobiliary: No focal liver abnormality is seen. No gallstones, gallbladder wall thickening, or biliary dilatation. Pancreas: Unremarkable. No pancreatic ductal dilatation or surrounding inflammatory changes. Spleen: Normal in size without focal abnormality. Adrenals/Urinary Tract: The adrenal glands are within normal limits. Hyperdense material is noted in the renal collecting system in the lower pole of the left kidney. No hydroureteronephrosis.  Hyperdense material is noted in the urinary bladder on the left in the region of the ureterovesicular junction. Stomach/Bowel: Stomach is within normal limits. Appendix appears normal. No evidence of bowel wall thickening, distention, or inflammatory changes. No free air or pneumatosis. Vascular/Lymphatic: Aortic atherosclerosis. No obvious abdominal or pelvic lymphadenopathy. Examination is limited due to ascites. Reproductive: The prostate gland is enlarged. Other: Large ascites and severe anasarca. Musculoskeletal: Degenerative changes are present in the thoracolumbar spine. No acute osseous abnormality. IMPRESSION: 1. Hyperdense material in the collecting system in the lower pole of the left kidney, possible blood products versus calcification. A small amount hyperdense material is noted in the urinary bladder on the left near the UVJ, possible  blood products. Correlation with urinalysis is recommended. 2. Cardiomegaly with moderate pericardial effusion. 3. Small bilateral pleural effusions with atelectasis at the lung bases. 4. Large ascites. 5. Severe anasarca. 6. Enlarged prostate gland. 7. Aortic atherosclerosis. Electronically Signed   By: Thornell Sartorius M.D.   On: 02/04/2023 23:05    Labs:  CBC: Recent Labs    02/05/23 0451 02/07/23 0202 02/08/23 0119 02/09/23 0706  WBC 7.4 7.8 8.3 7.1  HGB 8.1* 9.0* 7.7* 7.8*  HCT 27.4* 29.3* 25.2* 24.8*  PLT 112* PLATELET CLUMPS NOTED ON SMEAR, UNABLE TO ESTIMATE 99* 101*    COAGS: No results for input(s): "INR", "APTT" in the last 8760 hours.  BMP: Recent Labs    02/08/23 1554 02/09/23 0706 02/10/23 0044 02/11/23 0330  NA 139 137 137 136  K 4.9 3.8 4.4 4.2  CL 105 105 105 102  CO2 15* 22 22 21*  GLUCOSE 152* 74 104* 140*  BUN 66* 42* 49* 55*  CALCIUM 7.5* 7.3* 7.3* 7.4*  CREATININE 5.43* 3.82* 4.45* 4.96*  GFRNONAA 12* 18* 15* 13*    LIVER FUNCTION TESTS: Recent Labs    02/04/23 2107 02/05/23 1531 02/08/23 1554 02/09/23 0706  02/10/23 0044 02/11/23 0330  BILITOT 0.6  --   --   --   --   --   AST 11*  --   --   --   --   --   ALT 12  --   --   --   --   --   ALKPHOS 123  --   --   --   --   --   PROT 7.1  --   --   --   --   --   ALBUMIN 2.7*   < > 2.1* 2.0* 2.0* 2.2*   < > = values in this interval not displayed.    TUMOR MARKERS: No results for input(s): "AFPTM", "CEA", "CA199", "CHROMGRNA" in the last 8760 hours.  Assessment and Plan:  Scheduled for tunneled dialysis catheter placement Pt is Blind--- gave verbal consent  Risks and benefits discussed with the patient including, but not limited to bleeding, infection, vascular injury, pneumothorax which may require chest tube placement, air embolism or even death All of the patient's questions were answered, patient is agreeable to proceed. Consent signed and in chart.  Thank you for this interesting consult.  I greatly enjoyed meeting EDRIC BIERCE and look forward to participating in their care.  A copy of this report was sent to the requesting provider on this date.  Electronically Signed: Robet Leu, PA-C 02/11/2023, 11:35 AM   I spent a total of 20 Minutes    in face to face in clinical consultation, greater than 50% of which was counseling/coordinating care for tunn HD

## 2023-02-11 NOTE — Procedures (Signed)
Interventional Radiology Procedure:   Indications: ESRD  Procedure: Tunneled dialysis catheter placement  Findings: Right jugular Palindrome, 23 cm tip to cuff, tip at SVC/RA junction.   Complications: None     EBL: Minimal  Plan: Catheter is ready to use.    R. Lowella Dandy, MD  Pager: [phone]

## 2023-02-11 NOTE — Progress Notes (Signed)
Physical Therapy Treatment Patient Details Name: Todd Mccoy MRN: [id number] DOB: 05/21/1971 Today's Date: 02/11/2023   History of Present Illness 52 yo male admitted 8/2 with weakness and diarrhea. Pt hypothermic with anasarca and acute on chronic renal failure. CRRT 8/3-8/5.  Initiated IHD with pt having this 8/6.  PMhx: legal blindness, poorly controlled HTN, HLD, CHF, Lt BKA, CKD, T2DM, obesity, neuropathy and nephropathy (nephrotic syndrome) and retinopathy.    PT Comments  Pt required mod assist supine to sit and min assist sit to supine. He demo fair sitting balance EOB. Pt declining progression OOB. Supine in bed at end of session. Pt is scheduled for tunneled HD catheter placement in IR this afternoon followed by iHD.     If plan is discharge home, recommend the following: A lot of help with walking and/or transfers;A lot of help with bathing/dressing/bathroom;Assistance with cooking/housework;Assistance with feeding;Assist for transportation;Help with stairs or ramp for entrance;Supervision due to cognitive status   Can travel by private vehicle     No  Equipment Recommendations  Other (comment) (TBD)    Recommendations for Other Services       Precautions / Restrictions Precautions Precautions: Fall;Other (comment) Precaution Comments: blind, L BKA     Mobility  Bed Mobility Overal bed mobility: Needs Assistance Bed Mobility: Supine to Sit, Sit to Supine     Supine to sit: Mod assist, Used rails Sit to supine: Min assist, Used rails   General bed mobility comments: increased time, cues for sequencing    Transfers                   General transfer comment: Pt declining progression beyond EOB.    Ambulation/Gait                   Stairs             Wheelchair Mobility     Tilt Bed    Modified Rankin (Stroke Patients Only)       Balance Overall balance assessment: Needs assistance Sitting-balance support: No upper  extremity supported, Feet supported Sitting balance-Leahy Scale: Fair                                      Cognition Arousal: Alert Behavior During Therapy: Flat affect Overall Cognitive Status: No family/caregiver present to determine baseline cognitive functioning                                 General Comments: Pt flat and despondent. Following one step commands consistently.        Exercises      General Comments General comments (skin integrity, edema, etc.): VSS      Pertinent Vitals/Pain Pain Assessment Pain Assessment: No/denies pain    Home Living                          Prior Function            PT Goals (current goals can now be found in the care plan section) Acute Rehab PT Goals Patient Stated Goal: home Progress towards PT goals: Progressing toward goals    Frequency    Min 1X/week      PT Plan      Co-evaluation  AM-PAC PT "6 Clicks" Mobility   Outcome Measure  Help needed turning from your back to your side while in a flat bed without using bedrails?: A Little Help needed moving from lying on your back to sitting on the side of a flat bed without using bedrails?: A Lot Help needed moving to and from a bed to a chair (including a wheelchair)?: A Lot Help needed standing up from a chair using your arms (e.g., wheelchair or bedside chair)?: Total Help needed to walk in hospital room?: Total Help needed climbing 3-5 steps with a railing? : Total 6 Click Score: 10    End of Session   Activity Tolerance: Patient limited by fatigue;Other (comment) (self limiting) Patient left: in bed;with call bell/phone within reach Nurse Communication: Mobility status PT Visit Diagnosis: Muscle weakness (generalized) (M62.81)     Time: 1610-9604 PT Time Calculation (min) (ACUTE ONLY): 13 min  Charges:    $Therapeutic Activity: 8-22 mins PT General Charges $$ ACUTE PT VISIT: 1 Visit                      Ferd Glassing., PT  Office # [phone]    Ilda Foil 02/11/2023, 12:12 PM

## 2023-02-11 NOTE — TOC Initial Note (Signed)
Transition of Care [facility]) - Initial/Assessment Note    Patient Details  Name: Todd Mccoy MRN: [id number] Date of Birth: [birth year]  [facility]) CM/SW Contact:     A [place], Theresia Majors Phone Number: 02/11/2023, 12:53 PM  Clinical Narrative:                  CSW met with pt at bedside to discuss recommendation for SNF. He said that "whatever gets me out of here"   CSW will complete SNF workup to areas in [place], there are no preferences.   TOC will continue to follow.   Expected Discharge Plan: Skilled Nursing Facility Barriers to Discharge: Insurance Authorization, SNF Pending bed offer, Continued Medical Work up   Patient Goals and CMS Choice            Expected Discharge Plan and Services       Living arrangements for the past 2 months: Single Family Home                                      Prior Living Arrangements/Services Living arrangements for the past 2 months: Single Family Home Lives with:: Adult Children          Need for Family Participation in Patient Care: Yes (Comment) Care giver support system in place?: Yes (comment)   Criminal Activity/Legal Involvement Pertinent to Current Situation/Hospitalization: No - Comment as needed  Activities of Daily Living Home Assistive Devices/Equipment: Wheelchair ADL Screening (condition at time of admission) Patient's cognitive ability adequate to safely complete daily activities?: Yes Is the patient deaf or have difficulty hearing?: No Does the patient have difficulty seeing, even when wearing glasses/contacts?: Yes Does the patient have difficulty concentrating, remembering, or making decisions?: No Patient able to express need for assistance with ADLs?: Yes Does the patient have difficulty dressing or bathing?: Yes Independently performs ADLs?: No Communication: Independent Dressing (OT): Dependent Is this a change from baseline?: Pre-admission baseline Grooming:  Dependent Is this a change from baseline?: Pre-admission baseline Feeding: Dependent Is this a change from baseline?: Pre-admission baseline Bathing: Dependent Is this a change from baseline?: Pre-admission baseline Toileting: Dependent Is this a change from baseline?: Pre-admission baseline In/Out Bed: Dependent Is this a change from baseline?: Pre-admission baseline Walks in Home: Dependent Is this a change from baseline?: Pre-admission baseline Does the patient have difficulty walking or climbing stairs?: Yes Weakness of Legs: Both Weakness of Arms/Hands: Both  Permission Sought/Granted                  Emotional Assessment Appearance:: Disheveled Attitude/Demeanor/Rapport: Inconsistent Affect (typically observed): Irritable Orientation: : Oriented to Self, Oriented to Place Alcohol / Substance Use: Tobacco Use Psych Involvement: No (comment)  Admission diagnosis:  Diarrhea of presumed infectious origin [R19.7] Dehydration [E86.0] Shock circulatory (HCC) [R57.9] Generalized weakness [R53.1] Acute renal failure superimposed on chronic kidney disease, unspecified acute renal failure type, unspecified CKD stage (HCC) [N17.9, N18.9] Patient Active Problem List   Diagnosis Date Noted   Diarrhea of presumed infectious origin 02/08/2023   Metabolic acidosis 02/08/2023   Anemia of chronic disease 02/08/2023   Acute renal failure superimposed on chronic kidney disease (HCC) 02/08/2023   Encephalopathy acute 02/07/2023   Shock circulatory (HCC) 02/05/2023   PAD (peripheral artery disease) (HCC) 10/27/2022   Renal insufficiency    Acute on chronic systolic CHF (congestive heart failure) (HCC) 01/23/2022   Prolonged QT  interval 01/23/2022   Chest pain 01/23/2022   Elevated troponin 01/23/2022   Anemia due to chronic kidney disease 01/23/2022   Hypertensive emergency 01/23/2022   Hyponatremia 01/23/2022   Chronic combined systolic (congestive) and diastolic (congestive)  heart failure (HCC) 04/07/2021   Chronic renal disease, stage 4, severely decreased glomerular filtration rate between 15-29 mL/min/1.73 square meter (HCC)    Hypertensive urgency    Acute kidney injury superimposed on chronic kidney disease (HCC)    Acute CHF (congestive heart failure) (HCC) 02/27/2021   Normocytic anemia 02/27/2021   Anasarca 02/27/2021   Abscess of left foot 12/10/2020   S/P BKA (below knee amputation) unilateral, left (HCC) 12/10/2020   Dyslipidemia 12/10/2020   CKD (chronic kidney disease), stage III (HCC) 12/10/2020   HTN (hypertension) 11/12/2020   Cellulitis and abscess of foot    Diabetes mellitus with hyperglycemia (HCC) 09/07/2014   Tobacco abuse 09/07/2014   Abscess of back    Abscess of lower back 09/06/2014   DM2 (diabetes mellitus, type 2) (HCC) 09/06/2014   Sepsis (HCC) 09/06/2014   Scrotal abscess 06/20/2014   PCP:  [name], MD Pharmacy:   [id number] - [address] ([place]), [place] - [address] [place] ([name] [address] Phone: [phone] Fax: [phone]  [facility] - [place], [name] - [address] [name] [address] Phone: [phone] Fax: [phone]     Social Determinants of Health (SDOH) Social History: SDOH Screenings   Food Insecurity: Patient Unable To Answer (02/06/2023)  Housing: High Risk (02/06/2023)  Transportation Needs: Patient Unable To Answer (02/06/2023)  Utilities: Not At Risk (07/14/2022)  Depression (PHQ2-9): Low Risk  (02/24/2022)  Financial Resource Strain: Low Risk  (09/30/2021)  Physical Activity: Inactive (09/30/2021)  Social Connections: Moderately Isolated (09/30/2021)  Stress: No Stress Concern Present (09/30/2021)  Tobacco Use: High Risk (02/04/2023)   SDOH Interventions:     Readmission Risk Interventions     No data to display

## 2023-02-11 NOTE — Plan of Care (Signed)
  Problem: Education: Goal: Ability to describe self-care measures that may prevent or decrease complications (Diabetes Survival Skills Education) will improve Outcome: Progressing Goal: Individualized Educational Video(s) Outcome: Progressing   Problem: Coping: Goal: Ability to adjust to condition or change in health will improve Outcome: Progressing   Problem: Fluid Volume: Goal: Ability to maintain a balanced intake and output will improve Outcome: Progressing   Problem: Health Behavior/Discharge Planning: Goal: Ability to identify and utilize available resources and services will improve Outcome: Progressing Goal: Ability to manage health-related needs will improve Outcome: Progressing   Problem: Metabolic: Goal: Ability to maintain appropriate glucose levels will improve Outcome: Progressing   Problem: Nutritional: Goal: Maintenance of adequate nutrition will improve Outcome: Progressing Goal: Progress toward achieving an optimal weight will improve Outcome: Progressing   Problem: Skin Integrity: Goal: Risk for impaired skin integrity will decrease Outcome: Progressing   Problem: Tissue Perfusion: Goal: Adequacy of tissue perfusion will improve Outcome: Progressing   Problem: Education: Goal: Knowledge of General Education information will improve Description: Including pain rating scale, medication(s)/side effects and non-pharmacologic comfort measures Outcome: Progressing   Problem: Health Behavior/Discharge Planning: Goal: Ability to manage health-related needs will improve Outcome: Progressing   Problem: Clinical Measurements: Goal: Ability to maintain clinical measurements within normal limits will improve Outcome: Progressing Goal: Will remain free from infection Outcome: Progressing Goal: Diagnostic test results will improve Outcome: Progressing Goal: Respiratory complications will improve Outcome: Progressing Goal: Cardiovascular complication will  be avoided Outcome: Progressing   Problem: Activity: Goal: Risk for activity intolerance will decrease Outcome: Progressing   Problem: Nutrition: Goal: Adequate nutrition will be maintained Outcome: Progressing   Problem: Coping: Goal: Level of anxiety will decrease Outcome: Progressing   Problem: Elimination: Goal: Will not experience complications related to bowel motility Outcome: Progressing Goal: Will not experience complications related to urinary retention Outcome: Progressing   Problem: Pain Managment: Goal: General experience of comfort will improve Outcome: Progressing   Problem: Safety: Goal: Ability to remain free from injury will improve Outcome: Progressing   Problem: Skin Integrity: Goal: Risk for impaired skin integrity will decrease Outcome: Progressing   Problem: Education: Goal: Knowledge of disease and its progression will improve Outcome: Progressing Goal: Individualized Educational Video(s) Outcome: Progressing   Problem: Fluid Volume: Goal: Compliance with measures to maintain balanced fluid volume will improve Outcome: Progressing   Problem: Health Behavior/Discharge Planning: Goal: Ability to manage health-related needs will improve Outcome: Progressing   Problem: Nutritional: Goal: Ability to make healthy dietary choices will improve Outcome: Progressing   Problem: Clinical Measurements: Goal: Complications related to the disease process, condition or treatment will be avoided or minimized Outcome: Progressing   Problem: Safety: Goal: Non-violent Restraint(s) Outcome: Progressing

## 2023-02-11 NOTE — TOC Progression Note (Addendum)
Transition of Care [facility]) - Progression Note    Patient Details  Name: LAQUARIUS STEMARIE MRN: [id number] Date of Birth: March 07, 1971  Transition of Care [facility]) CM/SW Contact   A [place], [place] Phone Number: 02/11/2023, 5:34 PM  Clinical Narrative:     CSW was notified by Irving Burton at [facility] they can provide bed for pt as potential. CSW to update pt with bed offer.   TOC will continue to follow.   Expected Discharge Plan: Skilled Nursing Facility Barriers to Discharge: Insurance Authorization, SNF Pending bed offer, Continued Medical Work up  Expected Discharge Plan and Services       Living arrangements for the past 2 months: Single Family Home                                       Social Determinants of Health (SDOH) Interventions SDOH Screenings   Food Insecurity: Patient Unable To Answer (02/06/2023)  Housing: High Risk (02/06/2023)  Transportation Needs: Patient Unable To Answer (02/06/2023)  Utilities: Not At Risk (07/14/2022)  Depression (PHQ2-9): Low Risk  (02/24/2022)  Financial Resource Strain: Low Risk  (09/30/2021)  Physical Activity: Inactive (09/30/2021)  Social Connections: Moderately Isolated (09/30/2021)  Stress: No Stress Concern Present (09/30/2021)  Tobacco Use: High Risk (02/04/2023)    Readmission Risk Interventions     No data to display

## 2023-02-11 NOTE — [place] FL2 (Signed)
[place] MEDICAID FL2 LEVEL OF CARE FORM     IDENTIFICATION  Patient Name: Todd Mccoy Birthdate: [birth year] Sex: male Admission Date (Current Location): 02/04/2023  [place] and [place] Number:  Haynes Bast [id number] O Facility and Address:  [facility], [address] [place], [name] [address]      Provider Number: [id number]  Attending Physician Name and Address:  Azucena Fallen, MD  Relative Name and Phone Number:  Thea Gist (Mother)  [phone]    Current Level of Care: Hospital Recommended Level of Care: Skilled Nursing Facility Prior Approval Number:    Date Approved/Denied:   PASRR Number: [id number]  Discharge Plan: SNF    Current Diagnoses: Patient Active Problem List   Diagnosis Date Noted   Diarrhea of presumed infectious origin 02/08/2023   Metabolic acidosis 02/08/2023   Anemia of chronic disease 02/08/2023   Acute renal failure superimposed on chronic kidney disease (HCC) 02/08/2023   Encephalopathy acute 02/07/2023   Shock circulatory (HCC) 02/05/2023   PAD (peripheral artery disease) (HCC) 10/27/2022   Renal insufficiency    Acute on chronic systolic CHF (congestive heart failure) (HCC) 01/23/2022   Prolonged QT interval 01/23/2022   Chest pain 01/23/2022   Elevated troponin 01/23/2022   Anemia due to chronic kidney disease 01/23/2022   Hypertensive emergency 01/23/2022   Hyponatremia 01/23/2022   Chronic combined systolic (congestive) and diastolic (congestive) heart failure (HCC) 04/07/2021   Chronic renal disease, stage 4, severely decreased glomerular filtration rate between 15-29 mL/min/1.73 square meter (HCC)    Hypertensive urgency    Acute kidney injury superimposed on chronic kidney disease (HCC)    Acute CHF (congestive heart failure) (HCC) 02/27/2021   Normocytic anemia 02/27/2021   Anasarca 02/27/2021   Abscess of left foot 12/10/2020   S/P BKA (below knee amputation) unilateral, left  (HCC) 12/10/2020   Dyslipidemia 12/10/2020   CKD (chronic kidney disease), stage III (HCC) 12/10/2020   HTN (hypertension) 11/12/2020   Cellulitis and abscess of foot    Diabetes mellitus with hyperglycemia (HCC) 09/07/2014   Tobacco abuse 09/07/2014   Abscess of back    Abscess of lower back 09/06/2014   DM2 (diabetes mellitus, type 2) (HCC) 09/06/2014   Sepsis (HCC) 09/06/2014   Scrotal abscess 06/20/2014    Orientation RESPIRATION BLADDER Height & Weight     Self, Time, Place  Normal Continent Weight: 204 lb 5.9 oz (92.7 kg) Height:  5\' 10"  (177.8 cm)  BEHAVIORAL SYMPTOMS/MOOD NEUROLOGICAL BOWEL NUTRITION STATUS      Continent Diet (see discharge summary)  AMBULATORY STATUS COMMUNICATION OF NEEDS Skin   Extensive Assist Verbally Other (Comment), Skin abrasions (Diabetic Ulers open wound on pt's right mid lateral leg)                       Personal Care Assistance Level of Assistance  Bathing, Feeding, Dressing Bathing Assistance: Maximum assistance Feeding assistance: Limited assistance Dressing Assistance: Maximum assistance     Functional Limitations Info  Sight, Hearing, Speech Sight Info: Impaired Hearing Info: Adequate Speech Info: Adequate    SPECIAL CARE FACTORS FREQUENCY  PT (By licensed PT), OT (By licensed OT)     PT Frequency: 5x/week OT Frequency: 5x/week            Contractures Contractures Info: Not present    Additional Factors Info  Code Status, Allergies, Insulin Sliding Scale Code Status Info: FULL Allergies Info: No Known Allergies   Insulin Sliding Scale Info: see discharge  summary       Current Medications (02/11/2023):  This is the current hospital active medication list Current Facility-Administered Medications  Medication Dose Route Frequency Provider Last Rate Last Admin   0.9 %  sodium chloride infusion  250 mL Intravenous Continuous [name], MD   Paused at 02/07/23 0555   acetaminophen (TYLENOL) tablet 650 mg   650 mg Oral Q6H PRN John Giovanni, MD   650 mg at 02/11/23 1149   atorvastatin (LIPITOR) tablet 20 mg  20 mg Oral Daily Marrianne [place], MD   20 mg at 02/11/23 1149   Chlorhexidine Gluconate Cloth 2 % PADS 6 each  6 each Topical Q0600 Leslye Peer, MD   6 each at 02/11/23 [phone]   Chlorhexidine Gluconate Cloth 2 % PADS 6 each  6 each Topical Q0600 Annie Sable, MD   6 each at 02/11/23 1149   Darbepoetin Alfa (ARANESP) injection 60 mcg  60 mcg Subcutaneous Q Wed-1800 Annie Sable, MD   60 mcg at 02/09/23 1744   docusate sodium (COLACE) capsule 100 mg  100 mg Oral BID PRN [name], MD       feeding supplement (NEPRO CARB STEADY) liquid 237 mL  237 mL Oral TID BM [name], MD   237 mL at 02/10/23 2050   ferric gluconate (FERRLECIT) 250 mg in sodium chloride 0.9 % 250 mL IVPB  250 mg Intravenous Daily Annie Sable, MD 135 mL/hr at 02/10/23 1007 250 mg at 02/10/23 1007   furosemide (LASIX) injection 80 mg  80 mg Intravenous Q12H Annie Sable, MD   80 mg at 02/11/23 1152   heparin injection 1,000-6,000 Units  1,000-6,000 Units CRRT PRN Tyler Pita, MD   4,000 Units at 02/09/23 0458   heparin injection 5,000 Units  5,000 Units Subcutaneous Q8H [name], MD   5,000 Units at 02/11/23 4696   hydrALAZINE (APRESOLINE) tablet 50 mg  50 mg Oral TID Annie Sable, MD   50 mg at 02/11/23 1149   hydrOXYzine (ATARAX) tablet 25 mg  25 mg Oral TID PRN Luciano Cutter, MD   25 mg at 02/05/23 2207   insulin aspart (novoLOG) injection 0-5 Units  0-5 Units Subcutaneous QHS Howerter, Justin B, DO   2 Units at 02/10/23 2104   insulin aspart (novoLOG) injection 0-6 Units  0-6 Units Subcutaneous TID WC Howerter, Justin B, DO       isosorbide mononitrate (IMDUR) 24 hr tablet 30 mg  30 mg Oral Daily Jodelle Red, MD   30 mg at 02/11/23 1149   loperamide (IMODIUM) capsule 2 mg  2 mg Oral PRN Azucena Fallen, MD   2 mg at 02/09/23 1238    multivitamin (RENA-VIT) tablet 1 tablet  1 tablet Oral QHS [name], MD   1 tablet at 02/10/23 2102   mupirocin ointment (BACTROBAN) 2 % 1 Application  1 Application Nasal BID Leslye Peer, MD   1 Application at 02/11/23 1149   Oral care mouth rinse  15 mL Mouth Rinse PRN Martina Sinner, MD       polyethylene glycol (MIRALAX / GLYCOLAX) packet 17 g  17 g Oral Daily PRN [name], MD       sodium chloride 0.9 % primer fluid for CRRT   CRRT PRN Tyler Pita, MD       sodium chloride flush (NS) 0.9 % injection 10-40 mL  10-40 mL Intracatheter Q12H Rodolph Bong, MD   10 mL at 02/11/23 1150  sodium chloride flush (NS) 0.9 % injection 10-40 mL  10-40 mL Intracatheter PRN Rodolph Bong, MD         Discharge Medications: Please see discharge summary for a list of discharge medications.  Relevant Imaging Results:  Relevant Lab Results:   Additional Information ssN: [id number] Newly clipped HD patient. Schedule TBD, will updated FL2 once confirmed, can be changed to accomodate SNF location.   Aris Lot, LCSW

## 2023-02-11 NOTE — Progress Notes (Signed)
PROGRESS NOTE    Todd Mccoy  ZOX:[id number] DOB: Jul 03, 1971 DOA: 02/04/2023 PCP: [name], MD   Brief Narrative:  Patient 52 year old gentleman history of poorly controlled hypertension, diabetes, HF R EF, CKD stage IV, blindness, PVD status post left BKA presenting to the ED with 3 days of diarrhea, decreased urine output, decreased oral intake. Patient noted to be volume overloaded on examination and hypotensive. Patient noted to be in acute on chronic renal failure requiring CVVHD. Patient also required pressors for shock felt likely combination of hypovolemic and septic shock. Patient received a course of IV antibiotics for possible UTI. Nephrology consulted and following. Cardiology also consulted. Patient noted to have bouts of agitation and noted to be physically and verbally abusive to staff and subsequently placed on a Precedex drip. Precedex drip subsequently weaned off, patient transferred to the floor and to [facility].   Patient tolerated first session of HD well 02/09/23 - renal function continues to be sub-optimal - being evaluated for tunneled HD cath 8/9 with plan for ongoing HD outpatient.  Assessment & Plan:   Principal Problem:   Shock circulatory (HCC) Active Problems:   Encephalopathy acute   Diarrhea of presumed infectious origin   Metabolic acidosis   Anemia of chronic disease   Acute renal failure superimposed on chronic kidney disease (HCC)  Acute kidney injury on CKD stage V - likely advancing towards ESRD. - Creatinine down as expected after HD. - Patient started on CRRT 02/05/2023 due to volume overload and acidosis. - CRRT discontinued 8/4 - first HD 02/09/23 - Nephrology following - tunneled cath to be placed 8/9 - Dispo pending HD slot   Hypovolemic shock/likely concurrent septic shock, POA, resolved - Patient noted to have presented in shock felt likely multifactorial secondary to hypovolemia and UTI. - Patient admitted to the  ICU requiring pressors which have subsequently been discontinued - Blood cultures negative - urine cultures with multiple species present - Completed 3 days of ceftriaxone.   Acute metabolic encephalopathy, resolved -Patient noted to have some bouts of agitation, noted to be verbally abusive and physically abusive to staff in the ICU. 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 -Per cardiology no beta-blocker due to baseline bradycardia. -Patient currently on Lasix 80 mg IV every 12 hours. -Urine output >1L over past 24h -No cath at this time due to renal disease - repeat 2D echo in 3 months. -Cardiology signed off   Acute metabolic acidosis, resolved -Multifactorial: secondary to AKI on CKD stage V, septic shock -Improved on CRRT/antibiotics   Hypocalcemia -Secondary to above -Appropriate PTH response  Hypertension -Continue hydralazine 25 mg 3 times daily, Imdur 30 mg daily and uptitrate as needed for better blood pressure control.   Diarrhea -Unclear etiology, now resolved.  Diabetes mellitus with hypoglycemia, controlled -Likely secondary to poor oral intake. -Hemoglobin A1c 5.6 (02/06/2023) -Prior hypoglycemia resolved with increased PO intake   Anemia of chronic disease, concurrent iron deficiency -Anemia panel with iron level of 13, TIBC of 197. -Hemoglobin stabile - follow along given recent HD  Noncompliance/unspecified mood disorder -Patient demanding his foley be removed yesterday and was caught trying to pull at his central line(dressing became dislodged and was bleeding); telemetry also being removed -Lengthy discussion on need for compliance, especially in the setting of HD, diet,  fluid restriction, etc.  Patient indicates he understands.  Pain seeking behavior -Lengthy discussion with patient about appropriateness of narcotic use ongoing.  DVT prophylaxis: heparin injection 5,000 Units Start: 02/05/23 0600   Code Status:   Code Status: Full Code  Family Communication: None present  Status is: Inpt  Dispo: The patient is from: Home              Anticipated d/c is to: TBD - Possible DC to SNF(per PT) but patient wants to go home - it remains unclear if that is safe given his comorbidities and limited help at home.              Anticipated d/c date is: Pending renal recovery vs HD initiation              Patient currently NOT medically stable for discharge  Consultants:  Nephrology, Cardiology  Antimicrobials:  Completed   Subjective: No acute issues/events overnight - removed multiple lines/tubes/tele late yesterday with ongoing agitation.  Objective: Vitals:   02/10/23 2026 02/11/23 0041 02/11/23 0556 02/11/23 0747  BP: (!) 163/100 (!) 165/97 (!) 152/92 (!) 160/93  Pulse: 88 98 93 92  Resp: 17 18 18 14   Temp: 98.3 F (36.8 C) 97.9 F (36.6 C) 97.6 F (36.4 C) (!) 97.4 F (36.3 C)  TempSrc: Oral Oral Oral   SpO2: 100% 100% 97% 100%  Weight:   92.7 kg   Height:        Intake/Output Summary (Last 24 hours) at 02/11/2023 1137 Last data filed at 02/11/2023 1610 Gross per 24 hour  Intake 300 ml  Output 1050 ml  Net -750 ml   Filed Weights   02/08/23 0411 02/10/23 0500 02/11/23 0556  Weight: 100.3 kg 92.7 kg 92.7 kg    Examination:  General exam: No acute distress, agitated, short 1 word answer responses  Respiratory system: Clear to auscultation. Respiratory effort normal. Cardiovascular system: S1 & S2 heard, RRR. No JVD, murmurs, rubs, gallops or clicks. No pedal edema. Gastrointestinal system: Abdomen is nondistended, soft and nontender. No organomegaly or masses felt. Normal bowel sounds heard. Central nervous system: Alert and oriented. No focal neurological  deficits. Extremities: Symmetric 5 x 5 power without painful passive/active ROM testing. R foot bandage with small amount of bleeding noted near the heel  Data Reviewed: I have personally reviewed following labs and imaging studies  CBC: Recent Labs  Lab 02/04/23 2107 02/05/23 0451 02/07/23 0202 02/08/23 0119 02/09/23 0706  WBC 7.0 7.4 7.8 8.3 7.1  NEUTROABS 6.2  --   --   --  6.3  HGB 8.4* 8.1* 9.0* 7.7* 7.8*  HCT 28.0* 27.4* 29.3* 25.2* 24.8*  MCV 87.8 87.5 84.7 84.0 83.5  PLT 107* 112* PLATELET CLUMPS NOTED ON SMEAR, UNABLE TO ESTIMATE 99* 101*   Basic Metabolic Panel: Recent Labs  Lab 02/07/23 0202 02/07/23 1651 02/08/23 0119 02/08/23 1554 02/09/23 0706 02/10/23 0044 02/11/23 0330  NA 137   < > 138 139 137 137 136  K 4.9   < > 4.7 4.9 3.8 4.4 4.2  CL 107   < > 107 105 105 105 102  CO2 17*   < > 18* 15* 22 22 21*  GLUCOSE 81   < > 95 152* 74 104* 140*  BUN 59*   < > 62* 66* 42* 49* 55*  CREATININE 4.86*   < > 5.30* 5.43* 3.82* 4.45* 4.96*  CALCIUM 7.5*   < > 7.6*  7.5* 7.5* 7.3* 7.3*  7.4*  MG 2.0  --  1.9  --  1.6* 1.6* 1.6*  PHOS 3.5   < > 4.1 3.8 2.4* 2.9 2.6   < > = values in this interval not displayed.   GFR: Estimated Creatinine Clearance: 20.4 mL/min (A) (by C-G formula based on SCr of 4.96 mg/dL (H)).  Radiology Studies: No results found.  Scheduled Meds:  atorvastatin  20 mg Oral Daily   Chlorhexidine Gluconate Cloth  6 each Topical Q0600   Chlorhexidine Gluconate Cloth  6 each Topical Q0600   darbepoetin (ARANESP) injection - NON-DIALYSIS  60 mcg Subcutaneous Q Wed-1800   feeding supplement (NEPRO CARB STEADY)  237 mL Oral TID BM   furosemide  80 mg Intravenous Q12H   heparin  5,000 Units Subcutaneous Q8H   hydrALAZINE  50 mg Oral TID   insulin aspart  0-5 Units Subcutaneous QHS   insulin aspart  0-6 Units Subcutaneous TID WC   isosorbide mononitrate  30 mg Oral Daily   multivitamin  1 tablet Oral QHS   mupirocin ointment  1 Application Nasal  BID   sodium chloride flush  10-40 mL Intracatheter Q12H   Continuous Infusions:  sodium chloride Stopped (02/07/23 0555)   ferric gluconate (FERRLECIT) IVPB 250 mg (02/10/23 1007)   magnesium sulfate bolus IVPB       LOS: 6 days   Time spent:  Azucena Fallen, DO [facility]  If 7PM-7AM, please contact night-coverage [email or web address]  02/11/2023, 11:37 AM

## 2023-02-11 NOTE — Progress Notes (Signed)
Requested to see pt for out-pt HD needs at d/c. Met with pt at bedside. Introduced self and explained role. Pt lives in [place] and likely for snf placement. Pt prefers a clinic in the local [place] area. Referral submitted to [facility] admissions for review. Pt states he will need transportation assistance to/from HD as well.SNF will hopefully be able to transport pt while receiving rehab. Will assist as needed.   Olivia Canter Renal Navigator [phone]

## 2023-02-11 NOTE — Progress Notes (Signed)
[facility] Progress Note   Subjective:   Patient got agitated again yesterday-  pulled his foley cath and I was told he pulled his vascath but it is dressed this AM and appears to be in place ?  He is making noises that he just needs to get out of here  Objective Vitals:   02/10/23 2026 02/11/23 0041 02/11/23 0556 02/11/23 0747  BP: (!) 163/100 (!) 165/97 (!) 152/92 (!) 160/93  Pulse: 88 98 93 92  Resp: 17 18 18 14   Temp: 98.3 F (36.8 C) 97.9 F (36.6 C) 97.6 F (36.4 C) (!) 97.4 F (36.3 C)  TempSrc: Oral Oral Oral   SpO2: 100% 100% 97% 100%  Weight:   92.7 kg   Height:       Physical Exam General: more alert and talkative Heart: RRR, no rub Lungs: dec BS bases, normal WOB on RA Abdomen: soft, nontender, mildly distended Extremities: trace to 1+  edema ( better) , L BKA Dialysis Access: RIJ temp HD cath placed 8/3-  seems to be in place ?  Additional Objective Labs: Basic Metabolic Panel: Recent Labs  Lab 02/09/23 0706 02/10/23 0044 02/11/23 0330  NA 137 137 136  K 3.8 4.4 4.2  CL 105 105 102  CO2 22 22 21*  GLUCOSE 74 104* 140*  BUN 42* 49* 55*  CREATININE 3.82* 4.45* 4.96*  CALCIUM 7.3* 7.3* 7.4*  PHOS 2.4* 2.9 2.6   Liver Function Tests: Recent Labs  Lab 02/04/23 2107 02/05/23 1531 02/09/23 0706 02/10/23 0044 02/11/23 0330  AST 11*  --   --   --   --   ALT 12  --   --   --   --   ALKPHOS 123  --   --   --   --   BILITOT 0.6  --   --   --   --   PROT 7.1  --   --   --   --   ALBUMIN 2.7*   < > 2.0* 2.0* 2.2*   < > = values in this interval not displayed.   Recent Labs  Lab 02/04/23 2107  LIPASE 46   CBC: Recent Labs  Lab 02/04/23 2107 02/05/23 0451 02/07/23 0202 02/08/23 0119 02/09/23 0706  WBC 7.0 7.4 7.8 8.3 7.1  NEUTROABS 6.2  --   --   --  6.3  HGB 8.4* 8.1* 9.0* 7.7* 7.8*  HCT 28.0* 27.4* 29.3* 25.2* 24.8*  MCV 87.8 87.5 84.7 84.0 83.5  PLT 107* 112* PLATELET CLUMPS NOTED ON SMEAR, UNABLE TO ESTIMATE 99* 101*    Blood Culture    Component Value Date/Time   SDES BLOOD LEFT HAND 02/06/2023 1545   SDES BLOOD RIGHT HAND 02/06/2023 1545   SPECREQUEST  02/06/2023 1545    BOTTLES DRAWN AEROBIC ONLY Blood Culture results may not be optimal due to an inadequate volume of blood received in culture bottles   SPECREQUEST  02/06/2023 1545    BOTTLES DRAWN AEROBIC ONLY Blood Culture results may not be optimal due to an inadequate volume of blood received in culture bottles   CULT  02/06/2023 1545    NO GROWTH 5 DAYS Performed at [facility], [address], [place], [name] [address]    CULT  02/06/2023 1545    NO GROWTH 5 DAYS Performed at [facility], [address], [place], [name] [address]    REPTSTATUS 02/11/2023 FINAL 02/06/2023 1545   REPTSTATUS 02/11/2023  FINAL 02/06/2023 1545    Cardiac Enzymes: No results for input(s): "CKTOTAL", "CKMB", "CKMBINDEX", "TROPONINI" in the last 168 hours. CBG: Recent Labs  Lab 02/10/23 1649 02/10/23 2028 02/11/23 0047 02/11/23 0558 02/11/23 0743  GLUCAP 124* 213* 137* 124* 108*   Iron Studies:  No results for input(s): "IRON", "TIBC", "TRANSFERRIN", "FERRITIN" in the last 72 hours.  @lablastinr3 @ Studies/Results: No results found. Medications:  sodium chloride Stopped (02/07/23 0555)   ferric gluconate (FERRLECIT) IVPB 250 mg (02/10/23 1007)    atorvastatin  20 mg Oral Daily   Chlorhexidine Gluconate Cloth  6 each Topical Q0600   darbepoetin (ARANESP) injection - NON-DIALYSIS  60 mcg Subcutaneous Q Wed-1800   feeding supplement (NEPRO CARB STEADY)  237 mL Oral TID BM   furosemide  80 mg Intravenous Q12H   heparin  5,000 Units Subcutaneous Q8H   hydrALAZINE  50 mg Oral TID   insulin aspart  0-5 Units Subcutaneous QHS   insulin aspart  0-6 Units Subcutaneous TID WC   isosorbide mononitrate  30 mg Oral Daily   multivitamin  1 tablet Oral QHS   mupirocin ointment  1 Application Nasal BID   sodium chloride flush  10-40 mL  Intracatheter Q12H   Assessment/Plan **Shock:  Improved, cultures neg. Off pressors.  Off abx     **AKI on advanced CKD: Baseline CKD 5, had missed fu at [facility] since 08/2022.  Reported anuric x 24h prior to admission.  Suspect he's had progression of underlying CKD based in 10/2022 labs.  Started CRRT 8/3 for volume overload and acidosis.   d/c CRRT on 8/4 and plan to  start IHD -- transferred to [facility] for that.    He would accept outpt HD if necessary.    If not recovering pt will need CLIP and perm access.  So crt held its own albeit not in a great place-  he was not uremic and no acute indications for HD.  Did end up getting HD very early 8/7 AM.  Crt rose off of HD now for 48 hours-   will commit him to dialysis- is now ESRD.   I also did not  have a great feeling that he could fly off of HD mostly due to social issues ( blind, says unable to ambulate ) and these are still present.   Have told him today that we will make arrangements for OP HD and get a tunneled line but this is not likely to be finalized til Monday-  he is upset about this -  making noises that his anxiety will cause him to want to leave AMA-  hopeful we can get some meds on board to keep him here through this process-  will do HD tomorrow (Sat)    **HFrEF: 01/2022 EF 40%, severe RV dysfunction, pericardial effusion w/o tamponade.  Overloaded.  Cardiology following.  challenging with lasix and it is working-  also increased hydralazine for BP control -  no change today    **Volume:  volume overloaded but on RA, has had UF with CRRT, acheiveing negative balance with lasix-  now will control with HD   **metabolic acidosis:  secondary to AKI/CKD, improved with RRT   **Anemia:  suspect of CKD, no reported bleeding.  iron indices low-  will give.  started ESA.-     **Thrombocytopenia:  plt ~100, new finding.  stable  Hypoglycemia- better-  now able to eat  Bones-  Phos ok, PTH 157-  no action needed  Cecille Aver  02/11/2023, 8:37 AM  [employer]

## 2023-02-12 DIAGNOSIS — R579 Shock, unspecified: Secondary | ICD-10-CM | POA: Diagnosis not present

## 2023-02-12 LAB — RENAL FUNCTION PANEL
Albumin: 2.2 g/dL — ABNORMAL LOW (ref 3.5–5.0)
Anion gap: 12 (ref 5–15)
BUN: 59 mg/dL — ABNORMAL HIGH (ref 6–20)
CO2: 21 mmol/L — ABNORMAL LOW (ref 22–32)
Calcium: 7.4 mg/dL — ABNORMAL LOW (ref 8.9–10.3)
Chloride: 101 mmol/L (ref 98–111)
Creatinine, Ser: 5.31 mg/dL — ABNORMAL HIGH (ref 0.61–1.24)
GFR, Estimated: 12 mL/min — ABNORMAL LOW (ref >60)
Glucose, Bld: 162 mg/dL — ABNORMAL HIGH (ref 70–99)
Phosphorus: 2.6 mg/dL (ref 2.5–4.6)
Potassium: 4.6 mmol/L (ref 3.5–5.1)
Sodium: 134 mmol/L — ABNORMAL LOW (ref 135–145)

## 2023-02-12 LAB — CBC
HCT: 25.8 % — ABNORMAL LOW (ref 39.0–52.0)
Hemoglobin: 7.6 g/dL — ABNORMAL LOW (ref 13.0–17.0)
MCH: 26.6 pg (ref 26.0–34.0)
MCHC: 29.5 g/dL — ABNORMAL LOW (ref 30.0–36.0)
MCV: 90.2 fL (ref 80.0–100.0)
Platelets: 106 10*3/uL — ABNORMAL LOW (ref 150–400)
RBC: 2.86 MIL/uL — ABNORMAL LOW (ref 4.22–5.81)
RDW: 20.9 % — ABNORMAL HIGH (ref 11.5–15.5)
WBC: 7.6 10*3/uL (ref 4.0–10.5)
nRBC: 0 % (ref 0.0–0.2)

## 2023-02-12 MED ORDER — ISOSORBIDE MONONITRATE ER 60 MG PO TB24
60.0000 mg | ORAL_TABLET | Freq: Every day | ORAL | Status: DC
  Administered 2023-02-13 – 2023-02-14 (×2): 60 mg via ORAL
  Filled 2023-02-12 (×2): qty 1

## 2023-02-12 MED ORDER — HYDROXYZINE HCL 25 MG PO TABS
50.0000 mg | ORAL_TABLET | Freq: Three times a day (TID) | ORAL | Status: DC | PRN
  Administered 2023-02-12 – 2023-02-13 (×2): 50 mg via ORAL
  Filled 2023-02-12 (×2): qty 2

## 2023-02-12 MED ORDER — HYDRALAZINE HCL 20 MG/ML IJ SOLN
5.0000 mg | Freq: Once | INTRAMUSCULAR | Status: AC
  Administered 2023-02-12: 5 mg via INTRAVENOUS
  Filled 2023-02-12: qty 1

## 2023-02-12 MED ORDER — HEPARIN SODIUM (PORCINE) 1000 UNIT/ML IJ SOLN
3800.0000 [IU] | Freq: Once | INTRAMUSCULAR | Status: AC
  Administered 2023-02-12: 3800 [IU]
  Filled 2023-02-12: qty 4

## 2023-02-12 NOTE — Progress Notes (Signed)
Called to give nurse report phone rang back to nurses station x2. Secure chat sent to rn

## 2023-02-12 NOTE — Plan of Care (Signed)
  Problem: Education: Goal: Ability to describe self-care measures that may prevent or decrease complications (Diabetes Survival Skills Education) will improve Outcome: Progressing Goal: Individualized Educational Video(s) Outcome: Progressing   Problem: Coping: Goal: Ability to adjust to condition or change in health will improve Outcome: Progressing   Problem: Fluid Volume: Goal: Ability to maintain a balanced intake and output will improve Outcome: Progressing   Problem: Health Behavior/Discharge Planning: Goal: Ability to identify and utilize available resources and services will improve Outcome: Progressing Goal: Ability to manage health-related needs will improve Outcome: Progressing   Problem: Metabolic: Goal: Ability to maintain appropriate glucose levels will improve Outcome: Progressing   Problem: Nutritional: Goal: Maintenance of adequate nutrition will improve Outcome: Progressing Goal: Progress toward achieving an optimal weight will improve Outcome: Progressing   Problem: Skin Integrity: Goal: Risk for impaired skin integrity will decrease Outcome: Progressing   Problem: Tissue Perfusion: Goal: Adequacy of tissue perfusion will improve Outcome: Progressing   Problem: Education: Goal: Knowledge of General Education information will improve Description: Including pain rating scale, medication(s)/side effects and non-pharmacologic comfort measures Outcome: Progressing   Problem: Health Behavior/Discharge Planning: Goal: Ability to manage health-related needs will improve Outcome: Progressing   Problem: Clinical Measurements: Goal: Ability to maintain clinical measurements within normal limits will improve Outcome: Progressing Goal: Will remain free from infection Outcome: Progressing Goal: Diagnostic test results will improve Outcome: Progressing Goal: Respiratory complications will improve Outcome: Progressing Goal: Cardiovascular complication will  be avoided Outcome: Progressing   Problem: Activity: Goal: Risk for activity intolerance will decrease Outcome: Progressing   Problem: Nutrition: Goal: Adequate nutrition will be maintained Outcome: Progressing   Problem: Coping: Goal: Level of anxiety will decrease Outcome: Progressing   Problem: Elimination: Goal: Will not experience complications related to bowel motility Outcome: Progressing Goal: Will not experience complications related to urinary retention Outcome: Progressing   Problem: Pain Managment: Goal: General experience of comfort will improve Outcome: Progressing   Problem: Safety: Goal: Ability to remain free from injury will improve Outcome: Progressing   Problem: Skin Integrity: Goal: Risk for impaired skin integrity will decrease Outcome: Progressing   Problem: Education: Goal: Knowledge of disease and its progression will improve Outcome: Progressing Goal: Individualized Educational Video(s) Outcome: Progressing   Problem: Fluid Volume: Goal: Compliance with measures to maintain balanced fluid volume will improve Outcome: Progressing   Problem: Health Behavior/Discharge Planning: Goal: Ability to manage health-related needs will improve Outcome: Progressing   Problem: Nutritional: Goal: Ability to make healthy dietary choices will improve Outcome: Progressing   Problem: Clinical Measurements: Goal: Complications related to the disease process, condition or treatment will be avoided or minimized Outcome: Progressing   Problem: Safety: Goal: Non-violent Restraint(s) Outcome: Progressing

## 2023-02-12 NOTE — Progress Notes (Signed)
PROGRESS NOTE    Todd Mccoy  WGN:[id number] DOB: Mar 03, 1971 DOA: 02/04/2023 PCP: [name], MD   Brief Narrative:  Patient 52 year old gentleman history of poorly controlled hypertension, diabetes, HF R EF, CKD stage IV, blindness, PVD status post left BKA presenting to the ED with 3 days of diarrhea, decreased urine output, decreased oral intake. Patient noted to be volume overloaded on examination and hypotensive. Patient noted to be in acute on chronic renal failure requiring CVVHD. Patient also required pressors for shock felt likely combination of hypovolemic and septic shock. Patient received a course of IV antibiotics for possible UTI. Nephrology consulted and following. Cardiology also consulted. Patient noted to have bouts of agitation and noted to be physically and verbally abusive to staff and subsequently placed on a Precedex drip. Precedex drip subsequently weaned off, patient transferred to the floor and to [facility] service.   Patient tolerated first session of HD well 02/09/23 - renal function continues to be sub-optimal - tunneled HD cath placed 8/9 with plan for ongoing HD outpatient per nephrology.  Assessment & Plan:   Principal Problem:   Shock circulatory (HCC) Active Problems:   Encephalopathy acute   Diarrhea of presumed infectious origin   Metabolic acidosis   Anemia of chronic disease   Acute renal failure superimposed on chronic kidney disease (HCC)  Acute kidney injury on CKD stage V - likely advancing towards ESRD. - Creatinine down as expected after HD. - Patient started on CRRT 02/05/2023 due to volume overload and acidosis. - CRRT discontinued 8/4 - first HD 02/09/23 - tolerating well - Nephrology following - 8/9 - Right jugular Palindrome tunneled HD cath placed - Dispo pending HD slot   Hypovolemic shock/likely concurrent septic shock, POA, resolved - Patient noted to have presented in shock felt likely multifactorial secondary to  hypovolemia and UTI. - Patient admitted to the ICU requiring pressors which have subsequently been discontinued - Blood cultures negative - urine cultures with multiple species present - Completed 3 days of ceftriaxone.   Acute metabolic encephalopathy, resolved -Patient noted to have some bouts of agitation, noted to be verbally abusive and physically abusive to staff in the ICU. 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 -Per cardiology no beta-blocker due to baseline bradycardia. -Patient currently on Lasix 80 mg IV every 12 hours. -Urine output >1L over past 24h -No cath at this time due to renal disease - repeat 2D echo in 3 months. -Cardiology signed off   Acute metabolic acidosis, resolved -Multifactorial: secondary to AKI on CKD stage V, septic shock -Improved on CRRT/antibiotics   Hypocalcemia -Secondary to above -Appropriate PTH response  Hypertension -Continue hydralazine 25 mg 3 times daily, Imdur 30 mg daily and uptitrate as needed for better blood pressure control.   Diarrhea -Unclear etiology, now resolved.  Diabetes mellitus with hypoglycemia, controlled -Likely secondary to poor oral intake. -Hemoglobin A1c 5.6 (02/06/2023) -Prior hypoglycemia resolved with increased PO intake   Anemia of chronic disease, concurrent iron deficiency -Anemia panel with iron level of 13, TIBC of 197. -Hemoglobin stabile - follow along given recent HD  Noncompliance/unspecified mood disorder -Patient demanding his foley be removed yesterday and was caught trying to pull at his central line(dressing became dislodged and was bleeding); telemetry also being removed -Lengthy discussion on need for compliance, especially  in  the setting of HD, diet, fluid restriction, etc. Patient indicates he understands.  Pain seeking behavior -Lengthy discussion with patient about appropriateness of narcotic use ongoing.  DVT prophylaxis: heparin injection 5,000 Units Start: 02/05/23 0600   Code Status:   Code Status: Full Code  Family Communication: None present  Status is: Inpt  Dispo: The patient is from: Home              Anticipated d/c is to: TBD - Possible DC to SNF(per PT) but patient wants to go home - it remains unclear if that is safe given his comorbidities and limited help at home.              Anticipated d/c date is: Pending renal recovery vs HD initiation              Patient currently NOT medically stable for discharge  Consultants:  Nephrology, Cardiology  Antimicrobials:  Completed   Subjective: No acute issues/events overnight - removed multiple lines/tubes/tele late yesterday with ongoing agitation.  Objective: Vitals:   02/11/23 2022 02/12/23 0439 02/12/23 0500 02/12/23 0726  BP: 130/79 (!) 149/90  133/74  Pulse: 76 81    Resp: 16 18  (!) 24  Temp: 98.2 F (36.8 C) 98.6 F (37 C)  98 F (36.7 C)  TempSrc:  Oral  Oral  SpO2: 100% 100%  100%  Weight:   97.7 kg 97.7 kg  Height:        Intake/Output Summary (Last 24 hours) at 02/12/2023 0733 Last data filed at 02/12/2023 0600 Gross per 24 hour  Intake 577 ml  Output 1050 ml  Net -473 ml   Filed Weights   02/11/23 0556 02/12/23 0500 02/12/23 0726  Weight: 92.7 kg 97.7 kg 97.7 kg    Examination:  General exam: No acute distress, agitated, short 1 word answer responses  Respiratory system: Clear to auscultation. Respiratory effort normal. Cardiovascular system: S1 & S2 heard, RRR. No JVD, murmurs, rubs, gallops or clicks. No pedal edema. Gastrointestinal system: Abdomen is nondistended, soft and nontender. No organomegaly or masses felt. Normal bowel sounds heard. Central nervous system: Alert and oriented. No focal  neurological deficits. Extremities: Symmetric 5 x 5 power without painful passive/active ROM testing. R foot bandage with small amount of bleeding noted near the heel  Data Reviewed: I have personally reviewed following labs and imaging studies  CBC: Recent Labs  Lab 02/07/23 0202 02/08/23 0119 02/09/23 0706 02/11/23 2241  WBC 7.8 8.3 7.1 6.2  NEUTROABS  --   --  6.3  --   HGB 9.0* 7.7* 7.8* 7.6*  HCT 29.3* 25.2* 24.8* 25.1*  MCV 84.7 84.0 83.5 86.6  PLT PLATELET CLUMPS NOTED ON SMEAR, UNABLE TO ESTIMATE 99* 101* 92*   Basic Metabolic Panel: Recent Labs  Lab 02/08/23 0119 02/08/23 1554 02/09/23 0706 02/10/23 0044 02/11/23 0330 02/11/23 2241 02/12/23 0351  NA 138   < > 137 137 136 138 134*  K 4.7   < > 3.8 4.4 4.2 4.7 4.6  CL 107   < > 105 105 102 103 101  CO2 18*   < > 22 22 21* 22 21*  GLUCOSE 95   < > 74 104* 140* 151* 162*  BUN 62*   < > 42* 49* 55* 59* 59*  CREATININE 5.30*   < > 3.82* 4.45* 4.96* 5.48* 5.31*  CALCIUM 7.6*  7.5*   < > 7.3* 7.3* 7.4* 7.6* 7.4*  MG 1.9  --  1.6* 1.6* 1.6*  --  1.7  PHOS 4.1   < > 2.4* 2.9 2.6 2.8 2.6   < > = values in this interval not displayed.   GFR: Estimated Creatinine Clearance: 19.5 mL/min (A) (by C-G formula based on SCr of 5.31 mg/dL (H)).  Radiology Studies: No results found.  Scheduled Meds:  atorvastatin  20 mg Oral Daily   Chlorhexidine Gluconate Cloth  6 each Topical Q0600   darbepoetin (ARANESP) injection - NON-DIALYSIS  60 mcg Subcutaneous Q Wed-1800   feeding supplement (NEPRO CARB STEADY)  237 mL Oral TID BM   furosemide  80 mg Intravenous Q12H   heparin  5,000 Units Subcutaneous Q8H   hydrALAZINE  50 mg Oral TID   insulin aspart  0-5 Units Subcutaneous QHS   insulin aspart  0-6 Units Subcutaneous TID WC   isosorbide mononitrate  30 mg Oral Daily   multivitamin  1 tablet Oral QHS   sodium chloride flush  10-40 mL Intracatheter Q12H   Continuous Infusions:  sodium chloride Stopped (02/07/23 0555)    anticoagulant sodium citrate       LOS: 7 days   Time spent:  Azucena Fallen, DO [facility]  If 7PM-7AM, please contact night-coverage [email or web address]  02/12/2023, 7:33 AM

## 2023-02-12 NOTE — Progress Notes (Signed)
[facility] Progress Note   Subjective:  Appreciate IR to place [facility]-  also have started the search for an OP HD unit.  They are looking at placement that he does not think that he needs   Objective Vitals:   02/12/23 0737 02/12/23 0800 02/12/23 0830 02/12/23 0900  BP: (!) 162/90 (!) 167/100 (!) 185/113 (!) 177/114  Pulse:   82   Resp: (!) 21 17 14 15   Temp:      TempSrc:      SpO2: 98% 100% 99% 100%  Weight:      Height:       Physical Exam General: more alert and talkative Heart: RRR, no rub Lungs: dec BS bases, normal WOB on RA Abdomen: soft, nontender, mildly distended Extremities: trace to 1+  edema ( better) , L BKA Dialysis Access:  R [facility]  Additional Objective Labs: Basic Metabolic Panel: Recent Labs  Lab 02/11/23 0330 02/11/23 2241 02/12/23 0351  NA 136 138 134*  K 4.2 4.7 4.6  CL 102 103 101  CO2 21* 22 21*  GLUCOSE 140* 151* 162*  BUN 55* 59* 59*  CREATININE 4.96* 5.48* 5.31*  CALCIUM 7.4* 7.6* 7.4*  PHOS 2.6 2.8 2.6   Liver Function Tests: Recent Labs  Lab 02/11/23 0330 02/11/23 2241 02/12/23 0351  ALBUMIN 2.2* 2.1* 2.2*   No results for input(s): "LIPASE", "AMYLASE" in the last 168 hours.  CBC: Recent Labs  Lab 02/07/23 0202 02/08/23 0119 02/09/23 0706 02/11/23 2241 02/12/23 0739  WBC 7.8 8.3 7.1 6.2 7.6  NEUTROABS  --   --  6.3  --   --   HGB 9.0* 7.7* 7.8* 7.6* 7.6*  HCT 29.3* 25.2* 24.8* 25.1* 25.8*  MCV 84.7 84.0 83.5 86.6 90.2  PLT PLATELET CLUMPS NOTED ON SMEAR, UNABLE TO ESTIMATE 99* 101* 92* 106*   Blood Culture    Component Value Date/Time   SDES BLOOD LEFT HAND 02/06/2023 1545   SDES BLOOD RIGHT HAND 02/06/2023 1545   SPECREQUEST  02/06/2023 1545    BOTTLES DRAWN AEROBIC ONLY Blood Culture results may not be optimal due to an inadequate volume of blood received in culture bottles   SPECREQUEST  02/06/2023 1545    BOTTLES DRAWN AEROBIC ONLY Blood Culture results may not be optimal due to an inadequate  volume of blood received in culture bottles   CULT  02/06/2023 1545    NO GROWTH 5 DAYS Performed at [facility] Lab, [address], [place], [name] [address]    CULT  02/06/2023 1545    NO GROWTH 5 DAYS Performed at [facility], [address], [place], [name] [address]    REPTSTATUS 02/11/2023 FINAL 02/06/2023 1545   REPTSTATUS 02/11/2023 FINAL 02/06/2023 1545    Cardiac Enzymes: No results for input(s): "CKTOTAL", "CKMB", "CKMBINDEX", "TROPONINI" in the last 168 hours. CBG: Recent Labs  Lab 02/11/23 0558 02/11/23 0743 02/11/23 1145 02/11/23 1756 02/11/23 2024  GLUCAP 124* 108* 123* 76 137*   Iron Studies:  No results for input(s): "IRON", "TIBC", "TRANSFERRIN", "FERRITIN" in the last 72 hours.  @lablastinr3 @ Studies/Results: No results found. Medications:  sodium chloride Stopped (02/07/23 0555)   anticoagulant sodium citrate      atorvastatin  20 mg Oral Daily   Chlorhexidine Gluconate Cloth  6 each Topical Q0600   darbepoetin (ARANESP) injection - NON-DIALYSIS  60 mcg Subcutaneous Q Wed-1800   feeding supplement (NEPRO CARB STEADY)  237 mL Oral TID BM   furosemide  80 mg Intravenous Q12H   heparin  5,000 Units Subcutaneous Q8H   heparin sodium (porcine)  3,800 Units Intracatheter Once   hydrALAZINE  50 mg Oral TID   insulin aspart  0-5 Units Subcutaneous QHS   insulin aspart  0-6 Units Subcutaneous TID WC   isosorbide mononitrate  30 mg Oral Daily   multivitamin  1 tablet Oral QHS   sodium chloride flush  10-40 mL Intracatheter Q12H   Assessment/Plan **Shock:  Improved, cultures neg. Off pressors.  Off abx     **AKI on advanced CKD: Baseline CKD 5, had missed fu at [facility] since 08/2022.  Reported anuric x 24h prior to admission.  Suspect he's had progression of underlying CKD based in 10/2022 labs.  Started CRRT 8/3 for volume overload and acidosis.   d/c CRRT on 8/4 plan to  start IHD -- transferred to [facility] for that.    His crt was not increasing by  leaps and bounds so I did try to see if we could hold HD.  However, did not  have a great feeling that he could fly off of HD mostly due to social issues ( blind, says unable to ambulate, not really following up).   So now are making arrangements for OP HD s/p  tunneled line.   he is upset about needing to stay in the hospital but did not really bring that up today  -    hopeful we can get OP slot on Monday and then disposition can be secured-  He seemed to be doing OK at home prior to this so feels he can go back home.  Will call [facility] on Monday to see him for access - would be ideal to be done prior to discharge but at least get them on board to schedule for the future    **HFrEF: 01/2022 EF 40%, severe RV dysfunction, pericardial effusion w/o tamponade.  Overloaded.  Cardiology following.  Makes urine- utilizing  lasix with UF-  also increased hydralazine for BP control -  no change today    **Volume:  volume overloaded but on RA, has had UF with CRRT, acheiveing negative balance with lasix-  now will control with HD   **metabolic acidosis:  secondary to AKI/CKD, improved with RRT   **Anemia:  suspect of CKD, no reported bleeding.  Giving iron and ESA.-     **Thrombocytopenia:  plt ~100, new finding.  stable   Bones-  Phos ok, PTH 157-  no action needed      Cecille Aver  02/12/2023, 9:05 AM  [employer]

## 2023-02-12 NOTE — Progress Notes (Signed)
   02/12/23 1135  Post Treatment  Tolerated HD Treatment No (Comment) (alot of cramping over half way through tx)

## 2023-02-12 NOTE — Procedures (Signed)
Patient was seen on dialysis and the procedure was supervised.  BFR 350  Via TDC BP is  170/100.   Patient appears to be tolerating treatment well  Todd Mccoy 02/12/2023

## 2023-02-12 NOTE — Progress Notes (Signed)
Received patient in bed to unit.  Alert and oriented.  Informed consent signed and in chart.   TX duration:3:45  Patient tolerated well.  Transported back to the room  Alert, without acute distress.  Hand-off given to patient's nurse.   Access used: right [facility] Access issues: none  Total UF removed: 1L Medication(s) given: none   02/12/23 1129  Vitals  Temp 97.9 F (36.6 C)  Temp Source Oral  BP (!) 194/101  MAP (mmHg) 126  BP Location Right Arm  BP Method Automatic  Patient Position (if appropriate) Lying  Pulse Rate Source Monitor  ECG Heart Rate 81  Resp 18  Oxygen Therapy  SpO2 97 %  O2 Device Room Air  During Treatment Monitoring  HD Safety Checks Performed Yes  Intra-Hemodialysis Comments Tx completed  Dialysis Fluid Bolus Normal Saline  Bolus Amount (mL) 300 mL       S  [facility]

## 2023-02-12 NOTE — Progress Notes (Signed)
Nutrition Follow-up  DOCUMENTATION CODES:   Obesity unspecified  INTERVENTION:  "Nutrition for Dialysis" handout added to AVS Continue Heart Healthy diet as ordered Nepro Shake po TID, each supplement provides 425 kcal and 19 grams protein Renal MVI with minerals daily  NUTRITION DIAGNOSIS:   Increased nutrient needs related to acute illness (AKI on advanced CKD requiring CRRT) as evidenced by estimated needs. - ongoing  GOAL:   Patient will meet greater than or equal to 90% of their needs - progressing  MONITOR:   PO intake, Supplement acceptance, Labs, Weight trends  REASON FOR ASSESSMENT:   Consult Diet education (new ESRD diet education)  ASSESSMENT:   52 year old male with legal blindness, HTN, HLD, CKD, DM2 with neuropathy, nephropathy (nephrotic syndrome), and retinopathy, chronic systolic heart failure, left foot BKA for osteomyelitis, uses a wheelchair who presented after being on doxycycline for 1 week and developing  diarrhea for 3 days at least 5-7 times each day and for decreased urine output 24 hours prior to presenting. Admitted for circulatory shock and volume overload.  8/3- CRRT d/t volume overload and acidosis 8/4- CRRT d/c 8/7 - started on iHD 8/9 - s/p [facility] placement  Pt off unit for HD. Consult received for dialysis diet education. Dialysis handouts added to AVS however would benefit from in person follow up as pt is legally blind. Pt with also be followed by OP RD at HD center after discharge.  Multiple meal completions not documented however pt seems to be eating well for breakfast. Per MAR, pt has received at least 1-2 Nepro shakes daily when not contraindicated or NPO.   Meal completions: 8/5: 0% breakfast, 60% dinner 8/6: 100% breakfast 8/7: 100% breakfast 8/8: 75% breakfast, 75% lunch 8/9: 100% breakfast  Admit weight: 105 kg Current weight: 97.7 kg (pre HD weight today)  Medications: lasix 80mg  every 12 hours, SSI 0-5 units at bedtime,  SSI 0-6 units TID, rena-vit  Labs: sodium 134, BUN 59, Cr 5.31, corrected calcium 8.84, GFR 12, CBG's 76-137 x24 hours  UOP: x24 hours + 1 unmeasured occurrence I/O's: - since admit  Diet Order:   Diet Order             Diet Heart Room service appropriate? 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  Estimated Nutritional Needs:   Kcal:  2200-2400 kcals  Protein:  135-155 grams  Fluid:  >/= 2.2L  Drusilla Kanner, RDN, LDN Clinical Nutrition

## 2023-02-13 DIAGNOSIS — R579 Shock, unspecified: Secondary | ICD-10-CM | POA: Diagnosis not present

## 2023-02-13 LAB — RENAL FUNCTION PANEL
Albumin: 2 g/dL — ABNORMAL LOW (ref 3.5–5.0)
Anion gap: 11 (ref 5–15)
BUN: 38 mg/dL — ABNORMAL HIGH (ref 6–20)
CO2: 25 mmol/L (ref 22–32)
Calcium: 7.5 mg/dL — ABNORMAL LOW (ref 8.9–10.3)
Chloride: 100 mmol/L (ref 98–111)
Creatinine, Ser: 3.99 mg/dL — ABNORMAL HIGH (ref 0.61–1.24)
GFR, Estimated: 17 mL/min — ABNORMAL LOW (ref >60)
Glucose, Bld: 89 mg/dL (ref 70–99)
Phosphorus: 2.1 mg/dL — ABNORMAL LOW (ref 2.5–4.6)
Potassium: 3.9 mmol/L (ref 3.5–5.1)
Sodium: 136 mmol/L (ref 135–145)

## 2023-02-13 LAB — GLUCOSE, CAPILLARY
Glucose-Capillary: 111 mg/dL — ABNORMAL HIGH (ref 70–99)
Glucose-Capillary: 116 mg/dL — ABNORMAL HIGH (ref 70–99)
Glucose-Capillary: 104 mg/dL — ABNORMAL HIGH (ref 70–99)
Glucose-Capillary: 131 mg/dL — ABNORMAL HIGH (ref 70–99)

## 2023-02-13 LAB — MAGNESIUM: Magnesium: 1.4 mg/dL — ABNORMAL LOW (ref 1.7–2.4)

## 2023-02-13 MED ORDER — MAGNESIUM SULFATE IN D5W 1-5 GM/100ML-% IV SOLN
1.0000 g | Freq: Once | INTRAVENOUS | Status: DC
  Filled 2023-02-13: qty 100

## 2023-02-13 MED ORDER — FUROSEMIDE 40 MG PO TABS
40.0000 mg | ORAL_TABLET | Freq: Two times a day (BID) | ORAL | Status: DC
  Administered 2023-02-13 – 2023-02-14 (×3): 40 mg via ORAL
  Filled 2023-02-13 (×4): qty 1

## 2023-02-13 MED ORDER — MAGNESIUM OXIDE -MG SUPPLEMENT 400 (240 MG) MG PO TABS
400.0000 mg | ORAL_TABLET | Freq: Two times a day (BID) | ORAL | Status: DC
  Administered 2023-02-13 – 2023-02-14 (×4): 400 mg via ORAL
  Filled 2023-02-13 (×4): qty 1

## 2023-02-13 NOTE — Plan of Care (Signed)
Problem: Education: Goal: Ability to describe self-care measures that may prevent or decrease complications (Diabetes Survival Skills Education) will improve 02/13/2023 1046 by Redgie Grayer, RN Outcome: Progressing 02/13/2023 1045 by Redgie Grayer, RN Outcome: Progressing Goal: Individualized Educational Video(s) 02/13/2023 1046 by Redgie Grayer, RN Outcome: Progressing 02/13/2023 1045 by Redgie Grayer, RN Outcome: Progressing   Problem: Coping: Goal: Ability to adjust to condition or change in health will improve 02/13/2023 1046 by Redgie Grayer, RN Outcome: Progressing 02/13/2023 1045 by Redgie Grayer, RN Outcome: Progressing   Problem: Fluid Volume: Goal: Ability to maintain a balanced intake and output will improve 02/13/2023 1046 by Redgie Grayer, RN Outcome: Progressing 02/13/2023 1045 by Redgie Grayer, RN Outcome: Progressing   Problem: Health Behavior/Discharge Planning: Goal: Ability to identify and utilize available resources and services will improve 02/13/2023 1046 by Redgie Grayer, RN Outcome: Progressing 02/13/2023 1045 by Redgie Grayer, RN Outcome: Progressing Goal: Ability to manage health-related needs will improve 02/13/2023 1046 by Redgie Grayer, RN Outcome: Progressing 02/13/2023 1045 by Redgie Grayer, RN Outcome: Progressing   Problem: Metabolic: Goal: Ability to maintain appropriate glucose levels will improve 02/13/2023 1046 by Redgie Grayer, RN Outcome: Progressing 02/13/2023 1045 by Redgie Grayer, RN Outcome: Progressing   Problem: Nutritional: Goal: Maintenance of adequate nutrition will improve 02/13/2023 1046 by Redgie Grayer, RN Outcome: Progressing 02/13/2023 1045 by Redgie Grayer, RN Outcome: Progressing Goal: Progress toward achieving an optimal weight will improve 02/13/2023 1046 by Redgie Grayer, RN Outcome: Progressing 02/13/2023 1045 by Redgie Grayer, RN Outcome: Progressing   Problem: Skin Integrity: Goal: Risk for impaired skin  integrity will decrease 02/13/2023 1046 by Redgie Grayer, RN Outcome: Progressing 02/13/2023 1045 by Redgie Grayer, RN Outcome: Progressing   Problem: Tissue Perfusion: Goal: Adequacy of tissue perfusion will improve 02/13/2023 1046 by Redgie Grayer, RN Outcome: Progressing 02/13/2023 1045 by Redgie Grayer, RN Outcome: Progressing   Problem: Education: Goal: Knowledge of General Education information will improve Description: Including pain rating scale, medication(s)/side effects and non-pharmacologic comfort measures 02/13/2023 1046 by Redgie Grayer, RN Outcome: Progressing 02/13/2023 1045 by Redgie Grayer, RN Outcome: Progressing   Problem: Health Behavior/Discharge Planning: Goal: Ability to manage health-related needs will improve 02/13/2023 1046 by Redgie Grayer, RN Outcome: Progressing 02/13/2023 1045 by Redgie Grayer, RN Outcome: Progressing   Problem: Clinical Measurements: Goal: Ability to maintain clinical measurements within normal limits will improve 02/13/2023 1046 by Redgie Grayer, RN Outcome: Progressing 02/13/2023 1045 by Redgie Grayer, RN Outcome: Progressing Goal: Will remain free from infection 02/13/2023 1046 by Redgie Grayer, RN Outcome: Progressing 02/13/2023 1045 by Redgie Grayer, RN Outcome: Progressing Goal: Diagnostic test results will improve 02/13/2023 1046 by Redgie Grayer, RN Outcome: Progressing 02/13/2023 1045 by Redgie Grayer, RN Outcome: Progressing Goal: Respiratory complications will improve 02/13/2023 1046 by Redgie Grayer, RN Outcome: Progressing 02/13/2023 1045 by Redgie Grayer, RN Outcome: Progressing Goal: Cardiovascular complication will be avoided 02/13/2023 1046 by Redgie Grayer, RN Outcome: Progressing 02/13/2023 1045 by Redgie Grayer, RN Outcome: Progressing   Problem: Activity: Goal: Risk for activity intolerance will decrease 02/13/2023 1046 by Redgie Grayer, RN Outcome: Progressing 02/13/2023 1045 by Redgie Grayer, RN Outcome:  Progressing   Problem: Nutrition: Goal: Adequate nutrition will be maintained 02/13/2023 1046 by Redgie Grayer, RN Outcome: Progressing 02/13/2023 1045 by Redgie Grayer, RN Outcome: Progressing   Problem: Coping: Goal: Level of anxiety will decrease 02/13/2023 1046 by Redgie Grayer, RN Outcome: Progressing 02/13/2023 1045 by Redgie Grayer, RN Outcome: Progressing   Problem: Elimination: Goal: Will not experience complications related to bowel motility 02/13/2023 1046  by Redgie Grayer, RN Outcome: Progressing 02/13/2023 1045 by Redgie Grayer, RN Outcome: Progressing Goal: Will not experience complications related to urinary retention 02/13/2023 1046 by Redgie Grayer, RN Outcome: Progressing 02/13/2023 1045 by Redgie Grayer, RN Outcome: Progressing   Problem: Pain Managment: Goal: General experience of comfort will improve 02/13/2023 1046 by Redgie Grayer, RN Outcome: Progressing 02/13/2023 1045 by Redgie Grayer, RN Outcome: Progressing   Problem: Safety: Goal: Ability to remain free from injury will improve 02/13/2023 1046 by Redgie Grayer, RN Outcome: Progressing 02/13/2023 1045 by Redgie Grayer, RN Outcome: Progressing   Problem: Skin Integrity: Goal: Risk for impaired skin integrity will decrease 02/13/2023 1046 by Redgie Grayer, RN Outcome: Progressing 02/13/2023 1045 by Redgie Grayer, RN Outcome: Progressing   Problem: Education: Goal: Knowledge of disease and its progression will improve 02/13/2023 1046 by Redgie Grayer, RN Outcome: Progressing 02/13/2023 1045 by Redgie Grayer, RN Outcome: Progressing Goal: Individualized Educational Video(s) 02/13/2023 1046 by Redgie Grayer, RN Outcome: Progressing 02/13/2023 1045 by Redgie Grayer, RN Outcome: Progressing   Problem: Fluid Volume: Goal: Compliance with measures to maintain balanced fluid volume will improve 02/13/2023 1046 by Redgie Grayer, RN Outcome: Progressing 02/13/2023 1045 by Redgie Grayer, RN Outcome:  Progressing   Problem: Health Behavior/Discharge Planning: Goal: Ability to manage health-related needs will improve 02/13/2023 1046 by Redgie Grayer, RN Outcome: Progressing 02/13/2023 1045 by Redgie Grayer, RN Outcome: Progressing   Problem: Nutritional: Goal: Ability to make healthy dietary choices will improve 02/13/2023 1046 by Redgie Grayer, RN Outcome: Progressing 02/13/2023 1045 by Redgie Grayer, RN Outcome: Progressing   Problem: Clinical Measurements: Goal: Complications related to the disease process, condition or treatment will be avoided or minimized 02/13/2023 1046 by Redgie Grayer, RN Outcome: Progressing 02/13/2023 1045 by Redgie Grayer, RN Outcome: Progressing   Problem: Safety: Goal: Non-violent Restraint(s) 02/13/2023 1046 by Redgie Grayer, RN Outcome: Progressing 02/13/2023 1045 by Redgie Grayer, RN Outcome: Progressing

## 2023-02-13 NOTE — Progress Notes (Signed)
PROGRESS NOTE    Todd Mccoy  ZOX:[id number] DOB: Jul 02, 1971 DOA: 02/04/2023 PCP: [name], MD   Brief Narrative:  Patient 52 year old gentleman history of poorly controlled hypertension, diabetes, HF R EF, CKD stage IV, blindness, PVD status post left BKA presenting to the ED with 3 days of diarrhea, decreased urine output, decreased oral intake. Patient noted to be volume overloaded on examination and hypotensive. Patient noted to be in acute on chronic renal failure requiring CVVHD. Patient also required pressors for shock felt likely combination of hypovolemic and septic shock. Patient received a course of IV antibiotics for possible UTI. Nephrology consulted and following. Cardiology also consulted. Patient noted to have bouts of agitation and noted to be physically and verbally abusive to staff and subsequently placed on a Precedex drip. Precedex drip subsequently weaned off, patient transferred to the floor and to [facility].   Patient tolerated first session of HD well 02/09/23 - renal function continues to be sub-optimal - tunneled HD cath placed 8/9 with plan for ongoing HD outpatient per nephrology.  Assessment & Plan:   Principal Problem:   Shock circulatory (HCC) Active Problems:   Encephalopathy acute   Diarrhea of presumed infectious origin   Metabolic acidosis   Anemia of chronic disease   Acute renal failure superimposed on chronic kidney disease (HCC)  Acute kidney injury on CKD stage V - likely advancing towards ESRD. - Creatinine down as expected after HD. - Patient started on CRRT 02/05/2023 due to volume overload and acidosis. - CRRT discontinued 8/4 - first HD 02/09/23 - tolerating well - Nephrology following - 8/9 - Right jugular Palindrome tunneled HD cath placed - Dispo pending HD slot   Hypovolemic shock/likely concurrent septic shock, POA, resolved - Patient noted to have presented in shock felt likely multifactorial secondary to  hypovolemia and UTI. - Patient admitted to the ICU requiring pressors which have subsequently been discontinued - Blood cultures negative - urine cultures with multiple species present - Completed 3 days of ceftriaxone.   Acute metabolic encephalopathy, resolved -Patient noted to have some bouts of agitation, noted to be verbally abusive and physically abusive to staff in the ICU. 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 -Per cardiology no beta-blocker due to baseline bradycardia. -Patient currently on Lasix 80 mg IV every 12 hours. -Urine output >1L over past 24h -No cath at this time due to renal disease - repeat 2D echo in 3 months. -Cardiology signed off   Acute metabolic acidosis, resolved -Multifactorial: secondary to AKI on CKD stage V, septic shock -Improved on CRRT/antibiotics   Hypocalcemia -Secondary to above -Appropriate PTH response  Hypertension -Continue hydralazine 25 mg 3 times daily, Imdur 30 mg daily and uptitrate as needed for better blood pressure control.   Diarrhea -Unclear etiology, now resolved.  Diabetes mellitus with hypoglycemia, controlled -Likely secondary to poor oral intake. -Hemoglobin A1c 5.6 (02/06/2023) -Prior hypoglycemia resolved with increased PO intake   Anemia of chronic disease, concurrent iron deficiency -Anemia panel with iron level of 13, TIBC of 197. -Hemoglobin stabile - follow along given recent HD  Noncompliance/unspecified mood disorder -Patient demanding his foley be removed yesterday and was caught trying to pull at his central line(dressing became dislodged and was bleeding); telemetry also being removed -Lengthy discussion on need for compliance, especially  in  the setting of HD, diet, fluid restriction, etc  Pain seeking behavior -Lengthy discussion with patient about appropriateness of narcotic use ongoing.  DVT prophylaxis: heparin injection 5,000 Units Start: 02/05/23 0600   Code Status:   Code Status: Full Code  Family Communication: None present  Status is: Inpt  Dispo: The patient is from: Home              Anticipated d/c is to: TBD - Possible DC to SNF(per PT) but patient wants to go home - it remains unclear if that is safe given his comorbidities and limited help at home.              Anticipated d/c date is: Pending renal recovery vs HD initiation              Patient currently NOT medically stable for discharge  Consultants:  Nephrology, Cardiology  Antimicrobials:  Completed   Subjective: No acute issues/events overnight - patient unwilling to talk to me today, trying to rest. ROS limited.  Objective: Vitals:   02/12/23 1945 02/13/23 0314 02/13/23 0458 02/13/23 0500  BP: (!) 158/94 138/83 (!) 156/91   Pulse: 93 85 85   Resp: 17 16 16    Temp: 98 F (36.7 C) 98.3 F (36.8 C) 98.6 F (37 C)   TempSrc: Oral Oral Oral   SpO2: 100% 98% 100%   Weight:    92.2 kg  Height:        Intake/Output Summary (Last 24 hours) at 02/13/2023 0727 Last data filed at 02/13/2023 0500 Gross per 24 hour  Intake 370 ml  Output 9292.71 ml  Net -8922.71 ml   Filed Weights   02/12/23 0726 02/12/23 1132 02/13/23 0500  Weight: 97.7 kg 96.7 kg 92.2 kg    Examination:  General exam: No acute distress Respiratory system: Clear to auscultation. Respiratory effort normal. Cardiovascular system: S1 & S2 heard, RRR. No JVD, murmurs, rubs, gallops or clicks. No pedal edema. Gastrointestinal system: Abdomen is nondistended, soft and nontender. No organomegaly or masses felt. Normal bowel sounds heard. Central nervous system: Alert and oriented. No focal neurological deficits. Extremities: Symmetric 5 x 5 power without painful  passive/active ROM testing. R foot bandage with small amount of bleeding noted near the heel  Data Reviewed: I have personally reviewed following labs and imaging studies  CBC: Recent Labs  Lab 02/07/23 0202 02/08/23 0119 02/09/23 0706 02/11/23 2241 02/12/23 0739  WBC 7.8 8.3 7.1 6.2 7.6  NEUTROABS  --   --  6.3  --   --   HGB 9.0* 7.7* 7.8* 7.6* 7.6*  HCT 29.3* 25.2* 24.8* 25.1* 25.8*  MCV 84.7 84.0 83.5 86.6 90.2  PLT PLATELET CLUMPS NOTED ON SMEAR, UNABLE TO ESTIMATE 99* 101* 92* 106*   Basic Metabolic Panel: Recent Labs  Lab 02/09/23 0706 02/10/23 0044 02/11/23 0330 02/11/23 2241 02/12/23 0351 02/13/23 0320  NA 137 137 136 138 134* 136  K 3.8 4.4 4.2 4.7 4.6 3.9  CL 105 105 102 103 101 100  CO2 22 22 21* 22 21* 25  GLUCOSE 74 104* 140* 151* 162* 89  BUN 42* 49* 55* 59* 59* 38*  CREATININE 3.82* 4.45* 4.96* 5.48* 5.31* 3.99*  CALCIUM 7.3* 7.3* 7.4* 7.6* 7.4* 7.5*  MG 1.6* 1.6* 1.6*  --  1.7 1.4*  PHOS 2.4* 2.9 2.6 2.8 2.6 2.1*   GFR: Estimated Creatinine Clearance: 25.3 mL/min (A) (by C-G formula based on SCr of 3.99 mg/dL (H)).  Radiology Studies:  No results found.  Scheduled Meds:  atorvastatin  20 mg Oral Daily   Chlorhexidine Gluconate Cloth  6 each Topical Q0600   darbepoetin (ARANESP) injection - NON-DIALYSIS  60 mcg Subcutaneous Q Wed-1800   feeding supplement (NEPRO CARB STEADY)  237 mL Oral TID BM   furosemide  80 mg Intravenous Q12H   heparin  5,000 Units Subcutaneous Q8H   hydrALAZINE  50 mg Oral TID   insulin aspart  0-5 Units Subcutaneous QHS   insulin aspart  0-6 Units Subcutaneous TID WC   isosorbide mononitrate  60 mg Oral Daily   multivitamin  1 tablet Oral QHS   sodium chloride flush  10-40 mL Intracatheter Q12H   Continuous Infusions:  sodium chloride Stopped (02/07/23 0555)     LOS: 8 days   Time spent:  Azucena Fallen, DO [facility]  If 7PM-7AM, please contact night-coverage [email or web address]  02/13/2023,  7:27 AM

## 2023-02-13 NOTE — Plan of Care (Signed)
  Problem: Coping: Goal: Ability to adjust to condition or change in health will improve Outcome: Progressing   Problem: Metabolic: Goal: Ability to maintain appropriate glucose levels will improve Outcome: Progressing   Problem: Nutritional: Goal: Maintenance of adequate nutrition will improve Outcome: Progressing   Problem: Skin Integrity: Goal: Risk for impaired skin integrity will decrease Outcome: Progressing   Problem: Tissue Perfusion: Goal: Adequacy of tissue perfusion will improve Outcome: Progressing

## 2023-02-13 NOTE — Plan of Care (Signed)
  Problem: Education: Goal: Ability to describe self-care measures that may prevent or decrease complications (Diabetes Survival Skills Education) will improve Outcome: Progressing Goal: Individualized Educational Video(s) Outcome: Progressing   Problem: Coping: Goal: Ability to adjust to condition or change in health will improve Outcome: Progressing   Problem: Fluid Volume: Goal: Ability to maintain a balanced intake and output will improve Outcome: Progressing   Problem: Health Behavior/Discharge Planning: Goal: Ability to identify and utilize available resources and services will improve Outcome: Progressing Goal: Ability to manage health-related needs will improve Outcome: Progressing   Problem: Metabolic: Goal: Ability to maintain appropriate glucose levels will improve Outcome: Progressing   Problem: Nutritional: Goal: Maintenance of adequate nutrition will improve Outcome: Progressing Goal: Progress toward achieving an optimal weight will improve Outcome: Progressing   Problem: Skin Integrity: Goal: Risk for impaired skin integrity will decrease Outcome: Progressing   Problem: Tissue Perfusion: Goal: Adequacy of tissue perfusion will improve Outcome: Progressing   Problem: Education: Goal: Knowledge of General Education information will improve Description: Including pain rating scale, medication(s)/side effects and non-pharmacologic comfort measures Outcome: Progressing   Problem: Health Behavior/Discharge Planning: Goal: Ability to manage health-related needs will improve Outcome: Progressing   Problem: Clinical Measurements: Goal: Ability to maintain clinical measurements within normal limits will improve Outcome: Progressing Goal: Will remain free from infection Outcome: Progressing Goal: Diagnostic test results will improve Outcome: Progressing Goal: Respiratory complications will improve Outcome: Progressing Goal: Cardiovascular complication will  be avoided Outcome: Progressing   Problem: Activity: Goal: Risk for activity intolerance will decrease Outcome: Progressing   Problem: Nutrition: Goal: Adequate nutrition will be maintained Outcome: Progressing   Problem: Coping: Goal: Level of anxiety will decrease Outcome: Progressing   Problem: Elimination: Goal: Will not experience complications related to bowel motility Outcome: Progressing Goal: Will not experience complications related to urinary retention Outcome: Progressing   Problem: Pain Managment: Goal: General experience of comfort will improve Outcome: Progressing   Problem: Safety: Goal: Ability to remain free from injury will improve Outcome: Progressing   Problem: Skin Integrity: Goal: Risk for impaired skin integrity will decrease Outcome: Progressing   Problem: Education: Goal: Knowledge of disease and its progression will improve Outcome: Progressing Goal: Individualized Educational Video(s) Outcome: Progressing   Problem: Fluid Volume: Goal: Compliance with measures to maintain balanced fluid volume will improve Outcome: Progressing   Problem: Health Behavior/Discharge Planning: Goal: Ability to manage health-related needs will improve Outcome: Progressing   Problem: Nutritional: Goal: Ability to make healthy dietary choices will improve Outcome: Progressing   Problem: Clinical Measurements: Goal: Complications related to the disease process, condition or treatment will be avoided or minimized Outcome: Progressing   Problem: Safety: Goal: Non-violent Restraint(s) Outcome: Progressing

## 2023-02-13 NOTE — Progress Notes (Signed)
[facility] Progress Note   Subjective:  HD yest-  removed one liter - did have some cramping-  this AM lying naked in bed-  sheets, gown and pad on floor-  said he was hot.  Still is making noises that he wants to leave AMA  Objective Vitals:   02/13/23 0314 02/13/23 0458 02/13/23 0500 02/13/23 0734  BP: 138/83 (!) 156/91  (!) 162/94  Pulse: 85 85  82  Resp: 16 16  15   Temp: 98.3 F (36.8 C) 98.6 F (37 C)  98.8 F (37.1 C)  TempSrc: Oral Oral  Oral  SpO2: 98% 100%  98%  Weight:   92.2 kg   Height:       Physical Exam General: more alert and talkative Heart: RRR, no rub Lungs: dec BS bases, normal WOB on RA Abdomen: soft, nontender, mildly distended Extremities: trace  edema ( better) , L BKA Dialysis Access:  [facility]  Additional Objective Labs: Basic Metabolic Panel: Recent Labs  Lab 02/11/23 2241 02/12/23 0351 02/13/23 0320  NA 138 134* 136  K 4.7 4.6 3.9  CL 103 101 100  CO2 22 21* 25  GLUCOSE 151* 162* 89  BUN 59* 59* 38*  CREATININE 5.48* 5.31* 3.99*  CALCIUM 7.6* 7.4* 7.5*  PHOS 2.8 2.6 2.1*   Liver Function Tests: Recent Labs  Lab 02/11/23 2241 02/12/23 0351 02/13/23 0320  ALBUMIN 2.1* 2.2* 2.0*   No results for input(s): "LIPASE", "AMYLASE" in the last 168 hours.  CBC: Recent Labs  Lab 02/07/23 0202 02/08/23 0119 02/09/23 0706 02/11/23 2241 02/12/23 0739  WBC 7.8 8.3 7.1 6.2 7.6  NEUTROABS  --   --  6.3  --   --   HGB 9.0* 7.7* 7.8* 7.6* 7.6*  HCT 29.3* 25.2* 24.8* 25.1* 25.8*  MCV 84.7 84.0 83.5 86.6 90.2  PLT PLATELET CLUMPS NOTED ON SMEAR, UNABLE TO ESTIMATE 99* 101* 92* 106*   Blood Culture    Component Value Date/Time   SDES BLOOD LEFT HAND 02/06/2023 1545   SDES BLOOD RIGHT HAND 02/06/2023 1545   SPECREQUEST  02/06/2023 1545    BOTTLES DRAWN AEROBIC ONLY Blood Culture results may not be optimal due to an inadequate volume of blood received in culture bottles   SPECREQUEST  02/06/2023 1545    BOTTLES DRAWN  AEROBIC ONLY Blood Culture results may not be optimal due to an inadequate volume of blood received in culture bottles   CULT  02/06/2023 1545    NO GROWTH 5 DAYS Performed at [facility], [address], [place], [name] [address]    CULT  02/06/2023 1545    NO GROWTH 5 DAYS Performed at [facility], [address] [place], [name] [address]    REPTSTATUS 02/11/2023 FINAL 02/06/2023 1545   REPTSTATUS 02/11/2023 FINAL 02/06/2023 1545    Cardiac Enzymes: No results for input(s): "CKTOTAL", "CKMB", "CKMBINDEX", "TROPONINI" in the last 168 hours. CBG: Recent Labs  Lab 02/11/23 0743 02/11/23 1145 02/11/23 1756 02/11/23 2024 02/13/23 0821  GLUCAP 108* 123* 76 137* 111*   Iron Studies:  No results for input(s): "IRON", "TIBC", "TRANSFERRIN", "FERRITIN" in the last 72 hours.  @lablastinr3 @ Studies/Results: No results found. Medications:  sodium chloride Stopped (02/07/23 0555)    atorvastatin  20 mg Oral Daily   Chlorhexidine Gluconate Cloth  6 each Topical Q0600   darbepoetin (ARANESP) injection - NON-DIALYSIS  60 mcg Subcutaneous Q Wed-1800   feeding supplement (NEPRO CARB STEADY)  237  mL Oral TID BM   furosemide  40 mg Oral BID   heparin  5,000 Units Subcutaneous Q8H   hydrALAZINE  50 mg Oral TID   insulin aspart  0-5 Units Subcutaneous QHS   insulin aspart  0-6 Units Subcutaneous TID WC   isosorbide mononitrate  60 mg Oral Daily   magnesium oxide  400 mg Oral BID   multivitamin  1 tablet Oral QHS   sodium chloride flush  10-40 mL Intracatheter Q12H   Assessment/Plan **Shock:  Improved, cultures neg. Off pressors.  Off abx     **AKI on advanced CKD: Baseline CKD 5, had missed fu at [facility] since 08/2022.  Reported anuric x 24h prior to admission.  progression of underlying CKD based in 10/2022 labs.  Started CRRT 8/3 for volume overload and acidosis.   d/c CRRT on 8/4 plan to  start IHD -- transferred to [facility] for that.    His crt was not increasing by leaps and  bounds so I did try to see if we could hold HD.  However, did not  have a great feeling that he could fly off of HD mostly due to social issues ( blind, says unable to ambulate, not really following up).   So now are making arrangements for OP HD s/p  tunneled line-  last HD Sat , next will be Tuesday.   he is upset about needing to stay in the hospital  -    hopeful we can get OP slot on Monday and then disposition can be secured-  He seemed to be doing OK at home prior to this so feels he can go back home.  Have not really had the opportunity to get VVS involved-  if is going to need to stay past Monday will do     **HFrEF: 01/2022 EF 40%, severe RV dysfunction, pericardial effusion w/o tamponade.  Overloaded.  Cardiology following.  Makes urine- utilizing  lasix with UF-  also increased hydralazine for BP control -  no change today    **Volume:  volume overloaded but on RA, has had UF with CRRT, acheiveing negative balance with lasix-  now will control with HD   **metabolic acidosis:  secondary to AKI/CKD, improved with RRT   **Anemia:  suspect of CKD, no reported bleeding.  Giving iron and ESA.-     **Thrombocytopenia:  plt ~100, new finding.  stable   Bones-  Phos ok, PTH 157-  no action needed      Cecille Aver  02/13/2023, 10:40 AM  [employer]

## 2023-02-14 DIAGNOSIS — R579 Shock, unspecified: Secondary | ICD-10-CM | POA: Diagnosis not present

## 2023-02-14 DIAGNOSIS — I132 Hypertensive heart and chronic kidney disease with heart failure and with stage 5 chronic kidney disease, or end stage renal disease: Secondary | ICD-10-CM | POA: Insufficient documentation

## 2023-02-14 DIAGNOSIS — E11319 Type 2 diabetes mellitus with unspecified diabetic retinopathy without macular edema: Secondary | ICD-10-CM | POA: Insufficient documentation

## 2023-02-14 DIAGNOSIS — F17210 Nicotine dependence, cigarettes, uncomplicated: Secondary | ICD-10-CM | POA: Insufficient documentation

## 2023-02-14 DIAGNOSIS — E1151 Type 2 diabetes mellitus with diabetic peripheral angiopathy without gangrene: Secondary | ICD-10-CM | POA: Insufficient documentation

## 2023-02-14 DIAGNOSIS — E1140 Type 2 diabetes mellitus with diabetic neuropathy, unspecified: Secondary | ICD-10-CM | POA: Insufficient documentation

## 2023-02-14 DIAGNOSIS — E785 Hyperlipidemia, unspecified: Secondary | ICD-10-CM | POA: Insufficient documentation

## 2023-02-14 DIAGNOSIS — Z89421 Acquired absence of other right toe(s): Secondary | ICD-10-CM | POA: Insufficient documentation

## 2023-02-14 DIAGNOSIS — Z992 Dependence on renal dialysis: Secondary | ICD-10-CM | POA: Insufficient documentation

## 2023-02-14 DIAGNOSIS — Z89512 Acquired absence of left leg below knee: Secondary | ICD-10-CM | POA: Insufficient documentation

## 2023-02-14 LAB — GLUCOSE, CAPILLARY
Glucose-Capillary: 93 mg/dL (ref 70–99)
Glucose-Capillary: 143 mg/dL — ABNORMAL HIGH (ref 70–99)
Glucose-Capillary: 132 mg/dL — ABNORMAL HIGH (ref 70–99)
Glucose-Capillary: 135 mg/dL — ABNORMAL HIGH (ref 70–99)
Glucose-Capillary: 167 mg/dL — ABNORMAL HIGH (ref 70–99)
Glucose-Capillary: 154 mg/dL — ABNORMAL HIGH (ref 70–99)

## 2023-02-14 LAB — MAGNESIUM: Magnesium: 1.5 mg/dL — ABNORMAL LOW (ref 1.7–2.4)

## 2023-02-14 MED ORDER — ISOSORBIDE MONONITRATE ER 60 MG PO TB24: 60.0000 mg | ORAL_TABLET | Freq: Every day | ORAL | 2 refills | Status: DC

## 2023-02-14 MED ORDER — MAGNESIUM OXIDE -MG SUPPLEMENT 400 (240 MG) MG PO TABS: 400.0000 mg | ORAL_TABLET | Freq: Two times a day (BID) | ORAL | 2 refills | Status: DC

## 2023-02-14 MED ORDER — RENA-VITE PO TABS: 1.0000 | ORAL_TABLET | Freq: Every day | ORAL | 2 refills | Status: DC

## 2023-02-14 MED ORDER — HEPARIN SODIUM (PORCINE) 1000 UNIT/ML IJ SOLN
INTRAMUSCULAR | Status: AC
  Filled 2023-02-14: qty 4

## 2023-02-14 MED ORDER — HYDRALAZINE HCL 50 MG PO TABS: 50.0000 mg | ORAL_TABLET | Freq: Three times a day (TID) | ORAL | 2 refills | Status: DC

## 2023-02-14 MED ORDER — ACETAMINOPHEN 325 MG PO TABS: 650.0000 mg | ORAL_TABLET | Freq: Four times a day (QID) | ORAL | 0 refills | Status: DC | PRN

## 2023-02-14 MED ORDER — DOCUSATE SODIUM 100 MG PO CAPS: 100.0000 mg | ORAL_CAPSULE | Freq: Two times a day (BID) | ORAL | 0 refills | Status: DC | PRN

## 2023-02-14 MED ORDER — HYDROXYZINE HCL 50 MG PO TABS: 50.0000 mg | ORAL_TABLET | Freq: Three times a day (TID) | ORAL | 2 refills | Status: DC | PRN

## 2023-02-14 MED ORDER — FUROSEMIDE 40 MG PO TABS: 40.0000 mg | ORAL_TABLET | Freq: Two times a day (BID) | ORAL | 2 refills | Status: DC

## 2023-02-14 MED ORDER — POLYETHYLENE GLYCOL 3350 17 G PO PACK: 17.0000 g | PACK | Freq: Every day | ORAL | 0 refills | Status: DC | PRN

## 2023-02-14 NOTE — Progress Notes (Deleted)
PROGRESS NOTE    Todd Mccoy  MVH:[id number] DOB: 05-07-1971 DOA: 02/04/2023 PCP: [name], MD   Brief Narrative:  Patient 52 year old gentleman history of poorly controlled hypertension, diabetes, HF R EF, CKD stage IV, blindness, PVD status post left BKA presenting to the ED with 3 days of diarrhea, decreased urine output, decreased oral intake. Patient noted to be volume overloaded on examination and hypotensive. Patient noted to be in acute on chronic renal failure requiring CVVHD. Patient also required pressors for shock felt likely combination of hypovolemic and septic shock. Patient received a course of IV antibiotics for possible UTI. Nephrology consulted and following. Cardiology also consulted. Patient noted to have bouts of agitation and noted to be physically and verbally abusive to staff and subsequently placed on a Precedex drip. Precedex drip subsequently weaned off, patient transferred to the floor and to [facility].   Patient tolerated first session of HD well 02/09/23 - renal function continues to be sub-optimal - tunneled HD cath placed 8/9 with plan for ongoing HD outpatient per nephrology. Medically stable for discharge - awaiting safe disposition and outpatient HD slot.  Assessment & Plan:   Principal Problem:   Shock circulatory (HCC) Active Problems:   Encephalopathy acute   Diarrhea of presumed infectious origin   Metabolic acidosis   Anemia of chronic disease   Acute renal failure superimposed on chronic kidney disease (HCC)  Acute kidney injury on CKD stage V - likely advancing towards ESRD. - Creatinine down as expected after HD. - Patient started on CRRT 02/05/2023 due to volume overload and acidosis. - CRRT discontinued 8/4 - first HD 02/09/23 - tolerating well - Nephrology following - 8/9 - Right jugular Palindrome tunneled HD cath placed - Dispo pending HD slot   Hypovolemic shock/likely concurrent septic shock, POA, resolved -  Patient noted to have presented in shock felt likely multifactorial secondary to hypovolemia and UTI. - Patient admitted to the ICU requiring pressors which have subsequently been discontinued - Blood cultures negative - urine cultures with multiple species present - Completed 3 days of ceftriaxone.   Acute metabolic encephalopathy, resolved -Patient noted to have some bouts of agitation, noted to be verbally abusive and physically abusive to staff in the ICU. 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 -Per cardiology no beta-blocker due to baseline bradycardia. -Patient currently on Lasix 80 mg IV every 12 hours. -Urine output >1L over past 24h -No cath at this time due to renal disease - repeat 2D echo in 3 months. -Cardiology signed off   Acute metabolic acidosis, resolved -Multifactorial: secondary to AKI on CKD stage V, septic shock -Improved on CRRT/antibiotics   Hypocalcemia -Secondary to above -Appropriate PTH response  Hypertension -Continue hydralazine 25 mg 3 times daily, Imdur 30 mg daily and uptitrate as needed for better blood pressure control.   Diarrhea -Unclear etiology, now resolved.  Diabetes mellitus with hypoglycemia, controlled -Likely secondary to poor oral intake. -Hemoglobin A1c 5.6 (02/06/2023) -Prior hypoglycemia resolved with increased PO intake   Anemia of chronic disease, concurrent iron deficiency -Anemia panel with iron level of 13, TIBC of 197. -Hemoglobin stabile - follow along given recent HD  Noncompliance/unspecified mood disorder -Patient demanding his foley be removed yesterday and was caught trying to pull at his central line(dressing became dislodged and was  bleeding);  telemetry also being removed -Lengthy discussion on need for compliance, especially in the setting of HD, diet, fluid restriction, etc  Pain seeking behavior -Lengthy discussion with patient about appropriateness of narcotic use ongoing.  DVT prophylaxis: heparin injection 5,000 Units Start: 02/05/23 0600   Code Status:   Code Status: Full Code  Family Communication: None present  Status is: Inpt  Dispo: The patient is from: Home              Anticipated d/c is to: TBD - Possible DC to SNF(per PT) but patient wants to go home - it remains unclear if that is safe given his comorbidities and limited help at home.              Anticipated d/c date is: Pending renal recovery vs HD initiation              Patient currently NOT medically stable for discharge  Consultants:  Nephrology, Cardiology  Antimicrobials:  Completed   Subjective: No acute issues/events overnight - patient unwilling to talk to me today, trying to rest. ROS limited.  Objective: Vitals:   02/13/23 1603 02/13/23 2008 02/14/23 0500 02/14/23 0726  BP: (!) 179/94 (!) 154/94  (!) 159/97  Pulse: 71 86  78  Resp: 15 16  18   Temp: 98 F (36.7 C) 98.8 F (37.1 C)  98 F (36.7 C)  TempSrc:  Oral  Oral  SpO2: 97% 100%  98%  Weight:   90.4 kg   Height:        Intake/Output Summary (Last 24 hours) at 02/14/2023 0734 Last data filed at 02/13/2023 1300 Gross per 24 hour  Intake 120 ml  Output 300 ml  Net -180 ml   Filed Weights   02/12/23 1132 02/13/23 0500 02/14/23 0500  Weight: 96.7 kg 92.2 kg 90.4 kg    Examination:  General exam: No acute distress Respiratory system: Clear to auscultation. Respiratory effort normal. Cardiovascular system: S1 & S2 heard, RRR. No JVD, murmurs, rubs, gallops or clicks. No pedal edema. Gastrointestinal system: Abdomen is nondistended, soft and nontender. No organomegaly or masses felt. Normal bowel sounds heard. Central nervous system: Alert and oriented. No focal  neurological deficits. Extremities: Symmetric 5 x 5 power without painful passive/active ROM testing. R foot bandage with small amount of bleeding noted near the heel  Data Reviewed: I have personally reviewed following labs and imaging studies  CBC: Recent Labs  Lab 02/08/23 0119 02/09/23 0706 02/11/23 2241 02/12/23 0739  WBC 8.3 7.1 6.2 7.6  NEUTROABS  --  6.3  --   --   HGB 7.7* 7.8* 7.6* 7.6*  HCT 25.2* 24.8* 25.1* 25.8*  MCV 84.0 83.5 86.6 90.2  PLT 99* 101* 92* 106*   Basic Metabolic Panel: Recent Labs  Lab 02/10/23 0044 02/11/23 0330 02/11/23 2241 02/12/23 0351 02/13/23 0320 02/14/23 0136  NA 137 136 138 134* 136 133*  K 4.4 4.2 4.7 4.6 3.9 4.1  CL 105 102 103 101 100 98  CO2 22 21* 22 21* 25 23  GLUCOSE 104* 140* 151* 162* 89 121*  BUN 49* 55* 59* 59* 38* 44*  CREATININE 4.45* 4.96* 5.48* 5.31* 3.99* 4.52*  CALCIUM 7.3* 7.4* 7.6* 7.4* 7.5* 7.6*  MG 1.6* 1.6*  --  1.7 1.4* 1.5*  PHOS 2.9 2.6 2.8 2.6 2.1* 2.4*   GFR: Estimated Creatinine Clearance: 22.1 mL/min (A) (by C-G formula based on SCr of 4.52 mg/dL (H)).  Radiology Studies: No results found.  Scheduled Meds:  atorvastatin  20 mg Oral Daily   Chlorhexidine Gluconate Cloth  6 each Topical Q0600   darbepoetin (ARANESP) injection - NON-DIALYSIS  60 mcg Subcutaneous Q Wed-1800   feeding supplement (NEPRO CARB STEADY)  237 mL Oral TID BM   furosemide  40 mg Oral BID   heparin  5,000 Units Subcutaneous Q8H   hydrALAZINE  50 mg Oral TID   insulin aspart  0-5 Units Subcutaneous QHS   insulin aspart  0-6 Units Subcutaneous TID WC   isosorbide mononitrate  60 mg Oral Daily   magnesium oxide  400 mg Oral BID   multivitamin  1 tablet Oral QHS   sodium chloride flush  10-40 mL Intracatheter Q12H   Continuous Infusions:  sodium chloride Stopped (02/07/23 0555)     LOS: 9 days   Time spent:  Azucena Fallen, DO [facility]  If 7PM-7AM, please contact  night-coverage [email or web address]  02/14/2023, 7:34 AM

## 2023-02-14 NOTE — Progress Notes (Signed)
   02/14/23 1906  Vitals  Temp 98.4 F (36.9 C)  Pulse Rate 91  Resp 20  BP (!) 184/104  SpO2 100 %  O2 Device Room Air  Oxygen Therapy  Patient Activity (if Appropriate) In bed  Pulse Oximetry Type Continuous  Post Treatment  Dialyzer Clearance Clear  Duration of HD Treatment -hour(s) 3 hour(s)  Hemodialysis Intake (mL) 0 mL  Liters Processed 72  Fluid Removed (mL) 2000 mL  Tolerated HD Treatment Yes  Post-Hemodialysis Comments Pt. tolerated procedure without difficulties. VSS and UF goal met. Admin Medication per order. Report called to 2W RN Alvino Blood   Received patient in bed to unit.  Alert and oriented.  Informed consent signed and in chart.   TX duration: 3  Patient tolerated well.  Transported back to the room  Alert, without acute distress.  Hand-off given to patient's nurse.   Access used: Yes Access issues: No  Total UF removed: 2000 Medication(s) given: See MAR Post HD VS: See Above Grid Post HD weight: 88.6 kg   [facility]

## 2023-02-14 NOTE — Progress Notes (Signed)
Pt has been accepted at [facility]) on TTS 11:55 am chair time. Pt can start on Thursday and will need to arrive at 11:00 to complete paperwork prior to treatment. Update provided to attending, nephrologist, RN CM, and CSW. Also inquired about d/c plan and can discuss arrangements with pt once those are confirmed. Will assist as needed.   Olivia Canter Renal Navigator [phone]

## 2023-02-14 NOTE — Progress Notes (Signed)
[facility] Progress Note   Assessment/Plan **Shock:  Improved, cultures neg. Off pressors.  Off abx     **AKI on advanced CKD: Baseline CKD 5, had missed fu at [facility] since 08/2022.  Now progressed to ESRD on dialysis. Reported anuric x 24h prior to admission.  progression of underlying CKD based in 10/2022 labs.  Started CRRT 8/3 for volume overload and acidosis.   d/c CRRT on 8/4 plan to  start IHD -- transferred to [facility] for that.    His crt was not increasing by leaps and bounds so I did try to see if we could hold HD.  However, did not  have a great feeling that he could fly off of HD mostly due to social issues ( blind, says unable to ambulate, not really following up).     - Appreciate renal navigator finding a TTS spot at 1155AM and arrive at 11AM this Thur.  - Will plan on a 3hr treatment today in case he's being d/c. He really wants to leave the hospital    Have not really had the opportunity to get VVS involved-  if is going to need to stay past Monday will do     **HFrEF: 01/2022 EF 40%, severe RV dysfunction, pericardial effusion w/o tamponade.  Overloaded.  Cardiology following.  Makes urine- utilizing  lasix with UF-  also increased hydralazine for BP control -  no change today    **Volume:  volume overloaded but on RA, has had UF with CRRT, acheiveing negative balance with lasix-  now will control with HD   **metabolic acidosis:  secondary to AKI/CKD, improved with RRT   **Anemia:  suspect of CKD, no reported bleeding.  Giving iron and ESA.-     **Thrombocytopenia:  plt ~100, new finding.  stable   Bones-  Phos ok, PTH 157-  no action needed     Subjective:  HD sat -  removed one liter - did have some cramping. Denies f/c/n/v/sob.  He wants to go home.  Objective Vitals:   02/13/23 1603 02/13/23 2008 02/14/23 0500 02/14/23 0726  BP: (!) 179/94 (!) 154/94  (!) 159/97  Pulse: 71 86  78  Resp: 15 16  18   Temp: 98 F (36.7 C) 98.8 F (37.1 C)  98 F (36.7  C)  TempSrc:  Oral  Oral  SpO2: 97% 100%  98%  Weight:   90.4 kg   Height:       Physical Exam General: more alert and talkative Heart: RRR, no rub Lungs: dec BS bases, normal WOB on RA Abdomen: soft, nontender, mildly distended Extremities: trace  edema ( better) , L BKA Dialysis Access:  R [facility]  Additional Objective Labs: Basic Metabolic Panel: Recent Labs  Lab 02/12/23 0351 02/13/23 0320 02/14/23 0136  NA 134* 136 133*  K 4.6 3.9 4.1  CL 101 100 98  CO2 21* 25 23  GLUCOSE 162* 89 121*  BUN 59* 38* 44*  CREATININE 5.31* 3.99* 4.52*  CALCIUM 7.4* 7.5* 7.6*  PHOS 2.6 2.1* 2.4*   Liver Function Tests: Recent Labs  Lab 02/12/23 0351 02/13/23 0320 02/14/23 0136  ALBUMIN 2.2* 2.0* 2.0*   No results for input(s): "LIPASE", "AMYLASE" in the last 168 hours.  CBC: Recent Labs  Lab 02/08/23 0119 02/09/23 0706 02/11/23 2241 02/12/23 0739  WBC 8.3 7.1 6.2 7.6  NEUTROABS  --  6.3  --   --   HGB 7.7* 7.8* 7.6* 7.6*  HCT 25.2* 24.8* 25.1*  25.8*  MCV 84.0 83.5 86.6 90.2  PLT 99* 101* 92* 106*   Blood Culture    Component Value Date/Time   SDES BLOOD LEFT HAND 02/06/2023 1545   SDES BLOOD RIGHT HAND 02/06/2023 1545   SPECREQUEST  02/06/2023 1545    BOTTLES DRAWN AEROBIC ONLY Blood Culture results may not be optimal due to an inadequate volume of blood received in culture bottles   SPECREQUEST  02/06/2023 1545    BOTTLES DRAWN AEROBIC ONLY Blood Culture results may not be optimal due to an inadequate volume of blood received in culture bottles   CULT  02/06/2023 1545    NO GROWTH 5 DAYS Performed at [facility], [address] [place], [name] [address]    CULT  02/06/2023 1545    NO GROWTH 5 DAYS Performed at [facility], [address] [place], [name] [address]    REPTSTATUS 02/11/2023 FINAL 02/06/2023 1545   REPTSTATUS 02/11/2023 FINAL 02/06/2023 1545    Cardiac Enzymes: No results for input(s): "CKTOTAL", "CKMB", "CKMBINDEX",  "TROPONINI" in the last 168 hours. CBG: Recent Labs  Lab 02/13/23 1205 02/13/23 1604 02/13/23 2009 02/14/23 0818 02/14/23 1136  GLUCAP 116* 104* 131* 135* 167*   Iron Studies:  No results for input(s): "IRON", "TIBC", "TRANSFERRIN", "FERRITIN" in the last 72 hours.  @lablastinr3 @ Studies/Results: No results found. Medications:  sodium chloride Stopped (02/07/23 0555)    atorvastatin  20 mg Oral Daily   Chlorhexidine Gluconate Cloth  6 each Topical Q0600   darbepoetin (ARANESP) injection - NON-DIALYSIS  60 mcg Subcutaneous Q Wed-1800   feeding supplement (NEPRO CARB STEADY)  237 mL Oral TID BM   furosemide  40 mg Oral BID   heparin  5,000 Units Subcutaneous Q8H   hydrALAZINE  50 mg Oral TID   insulin aspart  0-5 Units Subcutaneous QHS   insulin aspart  0-6 Units Subcutaneous TID WC   isosorbide mononitrate  60 mg Oral Daily   magnesium oxide  400 mg Oral BID   multivitamin  1 tablet Oral QHS   sodium chloride flush  10-40 mL Intracatheter Q12H     ,  W  02/14/2023, 2:30 PM  [employer]

## 2023-02-14 NOTE — TOC Transition Note (Signed)
Transition of Care [facility]) - CM/SW Discharge Note   Patient Details  Name: Todd Mccoy MRN: [id number] Date of Birth: 1970/12/26  Transition of Care [facility]) CM/SW Contact:  Janae Bridgeman, RN Phone Number: 02/14/2023, 4:36 PM   Clinical Narrative:    CM spoke with Kiva [place], MSW and Medicare choice was offered to the patient regarding LTAC and patient accepted the offer for placement at [facility].  [facility] has an available admission bed today and patient can discharge to [facility] after completion of HD this evening.  Cain Sieve, RN is aware of the above plan.  She was instructed to give report to [facility] once bed has been determined at the facility.  Attending MD is completing the discharge summary.  Eddie Candle, RN was instructed to call [facility] for transport to the facility once back to [facility] unit - [facility] contact - [phone].  EmTALA form will need to be completed by bedside nursing since the patient is being transferred from Acute Facility to LTAC at [facility] - (Acute Facility).  Irving Burton will call Eddie Candle on the unit and notify her of the number to call report the the room number that patient will be admitted.   Final next level of care: Home w Home Health Services Barriers to Discharge: Insurance Authorization, SNF Pending bed offer, Continued Medical Work up   Patient Goals and CMS Choice      Discharge Placement                         Discharge Plan and Services Additional resources added to the After Visit Summary for                                       Social Determinants of Health (SDOH) Interventions SDOH Screenings   Food Insecurity: Patient Unable To Answer (02/06/2023)  Housing: High Risk (02/06/2023)  Transportation Needs: Patient Unable To Answer (02/06/2023)  Utilities: Not At Risk (07/14/2022)  Depression (PHQ2-9): Low Risk  (02/24/2022)  Financial Resource Strain: Low Risk  (09/30/2021)   Physical Activity: Inactive (09/30/2021)  Social Connections: Moderately Isolated (09/30/2021)  Stress: No Stress Concern Present (09/30/2021)  Tobacco Use: High Risk (02/04/2023)     Readmission Risk Interventions     No data to display

## 2023-02-14 NOTE — Discharge Summary (Signed)
Physician Discharge Summary  VIREN SCHENONE ZOX:[id number] DOB: [birth year] DOA: 02/04/2023  PCP: [name], MD  Admit date: 02/04/2023 Discharge date: 02/14/2023  Admitted From: Home Disposition:  LTAC  Recommendations for Outpatient Follow-up:  Follow up with PCP in 1-2 weeks Continue dialysis as discussed  Discharge Condition:Stable  CODE STATUS:Full  Diet recommendation: Renal diabetic diet   Brief/Interim Summary: Patient 52 year old gentleman history of poorly controlled hypertension, diabetes, HF R EF, CKD stage IV, blindness, PVD status post left BKA presenting to the ED with 3 days of diarrhea, decreased urine output, decreased oral intake. Patient noted to be volume overloaded on examination and hypotensive. Patient noted to be in acute on chronic renal failure requiring CVVHD. Patient also required pressors for shock felt likely combination of hypovolemic and septic shock. Patient received a course of IV antibiotics for possible UTI. Nephrology consulted and following. Cardiology also consulted. Patient noted to have bouts of agitation and noted to be physically and verbally abusive to staff and subsequently placed on a Precedex drip. Precedex drip subsequently weaned off, patient transferred to the floor and to [facility] hospitalist service.    Patient tolerated first session of HD well 02/09/23 - renal function continues to be sub-optimal - tunneled HD cath placed 8/9 with plan for ongoing HD outpatient per nephrology. Medically stable for discharge - currently accepted to ltac facility for ongoing care - outpatient HD slot obtained ([facility]) on TTS 11:55 am).  Discharge Diagnoses:  Principal Problem:   Shock circulatory (HCC) Active Problems:   Encephalopathy acute   Diarrhea of presumed infectious origin   Metabolic acidosis   Anemia of chronic disease   Acute renal failure superimposed on chronic kidney disease (HCC)    Discharge Instructions   Allergies as  of 02/14/2023   No Known Allergies      Medication List     STOP taking these medications    acyclovir ointment 5 % Commonly known as: Zovirax   amLODipine 10 MG tablet Commonly known as: NORVASC   carvedilol 25 MG tablet Commonly known as: COREG   doxycycline 50 MG capsule Commonly known as: MONODOX   ibuprofen 600 MG tablet Commonly known as: ADVIL   insulin glargine 100 UNIT/ML Solostar Pen Commonly known as: LANTUS   pregabalin 50 MG capsule Commonly known as: LYRICA   Torsemide 40 MG Tabs       TAKE these medications    acetaminophen 325 MG tablet Commonly known as: TYLENOL Take 2 tablets (650 mg total) by mouth every 6 (six) hours as needed for mild pain.   atorvastatin 20 MG tablet Commonly known as: LIPITOR Take 1 tablet (20 mg total) by mouth daily.   calcitRIOL 0.25 MCG capsule Commonly known as: ROCALTROL Take 0.25 mcg by mouth daily.   cetirizine 10 MG tablet Commonly known as: ZyrTEC Allergy Take 1 tablet (10 mg total) by mouth daily.   CLEAR EYES OP Place 1 drop into both eyes as needed (irritation).   docusate sodium 100 MG capsule Commonly known as: COLACE Take 1 capsule (100 mg total) by mouth 2 (two) times daily as needed for mild constipation.   ferrous sulfate 325 (65 FE) MG EC tablet Take 325 mg by mouth daily with breakfast.   furosemide 40 MG tablet Commonly known as: LASIX Take 1 tablet (40 mg total) by mouth 2 (two) times daily.   hydrALAZINE 50 MG tablet Commonly known as: APRESOLINE Take 1 tablet (50 mg total) by mouth 3 (three) times  daily. What changed:  medication strength how much to take when to take this   hydrOXYzine 50 MG tablet Commonly known as: ATARAX Take 1 tablet (50 mg total) by mouth 3 (three) times daily as needed for anxiety or itching. What changed:  medication strength how much to take when to take this reasons to take this   isosorbide mononitrate 60 MG 24 hr tablet Commonly known as:  IMDUR Take 1 tablet (60 mg total) by mouth daily. Start taking on: February 15, 2023   magnesium oxide 400 (240 Mg) MG tablet Commonly known as: MAG-OX Take 1 tablet (400 mg total) by mouth 2 (two) times daily. What changed:  how much to take when to take this   multivitamin Tabs tablet Take 1 tablet by mouth at bedtime.   polyethylene glycol 17 g packet Commonly known as: MIRALAX / GLYCOLAX Take 17 g by mouth daily as needed for moderate constipation.        Contact information for follow-up providers     [facility]. Go on 02/17/2023.   Why: Schedule is Tuesday, Thursday, Saturday with 11:55 am chair time.  On Thursday, please arrive at 11:00 am to complete paperwork prior to treatment. Contact information: [address] [name] [address] [phone]              Contact information for after-discharge care     Destination     [facility] .   Service: Long Term Acute Care Contact information: [address] [place] [address] [phone]                    No Known Allergies  Consultations: Nephrology, IR, cardiology, PCCM   Procedures/Studies: IR Fluoro Guide CV Line Right  Result Date: 02/14/2023 INDICATION: 52 year old with end-stage renal disease. EXAM: FLUOROSCOPIC AND ULTRASOUND GUIDED PLACEMENT OF A TUNNELED DIALYSIS CATHETER Physician: Rachelle Hora. Lowella Dandy, MD MEDICATIONS: Ancef 2 g; The antibiotic was administered within an appropriate time interval prior to skin puncture. ANESTHESIA/SEDATION: Moderate (conscious) sedation was employed during this procedure. A total of Versed 0.5mg  and fentanyl 25 mcg was administered intravenously at the order of the provider performing the procedure. Total intra-service moderate sedation time: 21 minutes. Patient's level of consciousness and vital signs were monitored continuously by radiology nurse throughout the procedure under the supervision of  the provider performing the procedure. FLUOROSCOPY TIME:  Radiation Exposure Index (as provided by the fluoroscopic device): 10 mGy Kerma COMPLICATIONS: None immediate. PROCEDURE: Informed consent was obtained for placement of a tunneled dialysis catheter. The patient was placed supine on the interventional table. Ultrasound confirmed a patent right internal jugular vein. Ultrasound image obtained for documentation. The right neck and chest was prepped and draped in a sterile fashion. Maximal barrier sterile technique was utilized including caps, mask, sterile gowns, sterile gloves, sterile drape, hand hygiene and skin antiseptic. The right neck was anesthetized with 1% lidocaine. A small incision was made with #11 blade scalpel. A 21 gauge needle directed into the right internal jugular vein with ultrasound guidance. A micropuncture dilator set was placed. A 23 cm tip to cuff Palindrome catheter was selected. The skin below the right clavicle was anesthetized and a small incision was made with an #11 blade scalpel. A subcutaneous tunnel was formed to the vein dermatotomy site. The catheter was brought through the tunnel. The vein dermatotomy site was dilated to accommodate a peel-away sheath. The catheter was placed through the peel-away sheath and directed into  the central venous structures. The tip of the catheter was placed at superior cavoatrial junction with fluoroscopy. Fluoroscopic images were obtained for documentation. Both lumens were found to aspirate and flush well. The proper amount of heparin was flushed in both lumens. The vein dermatotomy site was closed using a single layer of absorbable suture and Dermabond. The catheter was secured to the skin using Prolene suture. IMPRESSION: Successful placement of a right jugular tunneled dialysis catheter using ultrasound and fluoroscopic guidance. Electronically Signed   By: Richarda Overlie M.D.   On: 02/14/2023 08:34   IR US Guide Vasc Access Right  Result  Date: 02/14/2023 INDICATION: 52 year old with end-stage renal disease. EXAM: FLUOROSCOPIC AND ULTRASOUND GUIDED PLACEMENT OF A TUNNELED DIALYSIS CATHETER Physician: Rachelle Hora. Lowella Dandy, MD MEDICATIONS: Ancef 2 g; The antibiotic was administered within an appropriate time interval prior to skin puncture. ANESTHESIA/SEDATION: Moderate (conscious) sedation was employed during this procedure. A total of Versed 0.5mg  and fentanyl 25 mcg was administered intravenously at the order of the provider performing the procedure. Total intra-service moderate sedation time: 21 minutes. Patient's level of consciousness and vital signs were monitored continuously by radiology nurse throughout the procedure under the supervision of the provider performing the procedure. FLUOROSCOPY TIME:  Radiation Exposure Index (as provided by the fluoroscopic device): 10 mGy Kerma COMPLICATIONS: None immediate. PROCEDURE: Informed consent was obtained for placement of a tunneled dialysis catheter. The patient was placed supine on the interventional table. Ultrasound confirmed a patent right internal jugular vein. Ultrasound image obtained for documentation. The right neck and chest was prepped and draped in a sterile fashion. Maximal barrier sterile technique was utilized including caps, mask, sterile gowns, sterile gloves, sterile drape, hand hygiene and skin antiseptic. The right neck was anesthetized with 1% lidocaine. A small incision was made with #11 blade scalpel. A 21 gauge needle directed into the right internal jugular vein with ultrasound guidance. A micropuncture dilator set was placed. A 23 cm tip to cuff Palindrome catheter was selected. The skin below the right clavicle was anesthetized and a small incision was made with an #11 blade scalpel. A subcutaneous tunnel was formed to the vein dermatotomy site. The catheter was brought through the tunnel. The vein dermatotomy site was dilated to accommodate a peel-away sheath. The catheter was  placed through the peel-away sheath and directed into the central venous structures. The tip of the catheter was placed at superior cavoatrial junction with fluoroscopy. Fluoroscopic images were obtained for documentation. Both lumens were found to aspirate and flush well. The proper amount of heparin was flushed in both lumens. The vein dermatotomy site was closed using a single layer of absorbable suture and Dermabond. The catheter was secured to the skin using Prolene suture. IMPRESSION: Successful placement of a right jugular tunneled dialysis catheter using ultrasound and fluoroscopic guidance. Electronically Signed   By: Richarda Overlie M.D.   On: 02/14/2023 08:34   ECHOCARDIOGRAM COMPLETE  Result Date: 02/05/2023    ECHOCARDIOGRAM REPORT   Patient Name:   ANZEL MATHIASON Date of Exam: 02/05/2023 Medical Rec #:  [id number]           Height:       70.0 in Accession #:    [id number]          Weight:       231.5 lb Date of Birth:  1970/11/02           BSA:          2.221 m Patient Age:  52 years            BP:           128/81 mmHg Patient Gender: M                   HR:           67 bpm. Exam Location:  Inpatient Procedure: 2D Echo, Cardiac Doppler and Color Doppler STAT ECHO Indications:     Dilated cardiomyopathy; pericardial effusion  History:         Patient has prior history of Echocardiogram examinations, most                  recent 01/23/2022. CHF, CKD and PAD, Signs/Symptoms:anasarca;                  Risk Factors:Dyslipidemia, Diabetes, Hypertension and Current                  Smoker. Shock, abscesses, sepsis, cellulitis.  Sonographer:     Wallie Char Referring Phys:  [id number] [name] Diagnosing Phys: Weston Brass MD  Sonographer Comments: Messaged MD @ 8:48am IMPRESSIONS  1. Left ventricular ejection fraction, by estimation, is 40 to 45%. The left ventricle has mildly decreased function. The left ventricle demonstrates global hypokinesis. There is moderate left ventricular hypertrophy.  Left ventricular diastolic parameters are consistent with Grade III diastolic dysfunction (restrictive).  2. Right ventricular systolic function is moderately reduced. The right ventricular size is moderately enlarged. There is severely elevated pulmonary artery systolic pressure. The estimated right ventricular systolic pressure is 67.7 mmHg.  3. Left atrial size was moderately dilated.  4. Right atrial size was moderately dilated.  5. Moderate pericardial effusion. The pericardial effusion is circumferential. There is no definite evidence of cardiac tamponade by echocardiogram (BP and HR noted above).  6. The mitral valve is normal in structure. Trivial mitral valve regurgitation. No evidence of mitral stenosis.  7. The aortic valve is tricuspid. Aortic valve regurgitation is not visualized. No aortic stenosis is present.  8. The inferior vena cava is dilated in size with <50% respiratory variability, suggesting right atrial pressure of 15 mmHg. FINDINGS  Left Ventricle: Left ventricular ejection fraction, by estimation, is 40 to 45%. The left ventricle has mildly decreased function. The left ventricle demonstrates global hypokinesis. The left ventricular internal cavity size was normal in size. There is  moderate left ventricular hypertrophy. Left ventricular diastolic parameters are consistent with Grade III diastolic dysfunction (restrictive). Right Ventricle: The right ventricular size is moderately enlarged. No increase in right ventricular wall thickness. Right ventricular systolic function is moderately reduced. There is severely elevated pulmonary artery systolic pressure. The tricuspid regurgitant velocity is 3.63 m/s, and with an assumed right atrial pressure of 15 mmHg, the estimated right ventricular systolic pressure is 67.7 mmHg. Left Atrium: Left atrial size was moderately dilated. Right Atrium: Right atrial size was moderately dilated. Pericardium: A moderately sized pericardial effusion is  present. The pericardial effusion is circumferential. There is no evidence of cardiac tamponade. Mitral Valve: The mitral valve is normal in structure. Trivial mitral valve regurgitation. No evidence of mitral valve stenosis. MV peak gradient, 2.7 mmHg. The mean mitral valve gradient is 1.0 mmHg. Tricuspid Valve: The tricuspid valve is normal in structure. Tricuspid valve regurgitation is mild . No evidence of tricuspid stenosis. Aortic Valve: The aortic valve is tricuspid. Aortic valve regurgitation is not visualized. No aortic stenosis is present. Aortic valve mean gradient measures 3.0 mmHg. Aortic  valve peak gradient measures 6.9 mmHg. Aortic valve area, by VTI measures 3.36 cm. Pulmonic Valve: The pulmonic valve was normal in structure. Pulmonic valve regurgitation is mild. No evidence of pulmonic stenosis. Aorta: The aortic root is normal in size and structure. Venous: The inferior vena cava is dilated in size with less than 50% respiratory variability, suggesting right atrial pressure of 15 mmHg. IAS/Shunts: The interatrial septum was not assessed.  LEFT VENTRICLE PLAX 2D LVIDd:         4.70 cm      Diastology LVIDs:         3.90 cm      LV e' medial:    3.57 cm/s LV PW:         1.10 cm      LV E/e' medial:  24.2 LV IVS:        1.40 cm      LV e' lateral:   5.18 cm/s LVOT diam:     2.30 cm      LV E/e' lateral: 16.7 LV SV:         96 LV SV Index:   43 LVOT Area:     4.15 cm  LV Volumes (MOD) LV vol d, MOD A2C: 126.0 ml LV vol d, MOD A4C: 118.0 ml LV vol s, MOD A2C: 80.9 ml LV vol s, MOD A4C: 67.4 ml LV SV MOD A2C:     45.1 ml LV SV MOD A4C:     118.0 ml LV SV MOD BP:      48.8 ml RIGHT VENTRICLE            IVC RV Basal diam:  4.80 cm    IVC diam: 2.70 cm RV S prime:     5.77 cm/s TAPSE (M-mode): 1.1 cm LEFT ATRIUM             Index        RIGHT ATRIUM           Index LA diam:        5.50 cm 2.48 cm/m   RA Area:     26.60 cm LA Vol (A2C):   97.0 ml 43.67 ml/m  RA Volume:   94.80 ml  42.68 ml/m LA Vol  (A4C):   98.2 ml 44.21 ml/m LA Biplane Vol: 99.7 ml 44.89 ml/m  AORTIC VALVE AV Area (Vmax):    3.52 cm AV Area (Vmean):   3.56 cm AV Area (VTI):     3.36 cm AV Vmax:           131.00 cm/s AV Vmean:          82.850 cm/s AV VTI:            0.284 m AV Peak Grad:      6.9 mmHg AV Mean Grad:      3.0 mmHg LVOT Vmax:         111.00 cm/s LVOT Vmean:        71.050 cm/s LVOT VTI:          0.230 m LVOT/AV VTI ratio: 0.81  AORTA Ao Root diam: 3.30 cm Ao Asc diam:  3.60 cm MITRAL VALVE               TRICUSPID VALVE MV Area (PHT): 2.93 cm    TR Peak grad:   52.7 mmHg MV Area VTI:   3.57 cm    TR Vmax:        363.00 cm/s MV Peak  grad:  2.7 mmHg MV Mean grad:  1.0 mmHg    SHUNTS MV Vmax:       0.83 m/s    Systemic VTI:  0.23 m MV Vmean:      45.6 cm/s   Systemic Diam: 2.30 cm MV Decel Time: 259 msec MV E velocity: 86.30 cm/s MV A velocity: 37.20 cm/s MV E/A ratio:  2.32 Weston Brass MD Electronically signed by Weston Brass MD Signature Date/Time: 02/05/2023/9:03:51 AM    Final (Updated)    DG Chest Port 1 View  Result Date: 02/05/2023 CLINICAL DATA:  Central line placement EXAM: PORTABLE CHEST 1 VIEW COMPARISON:  02/05/2023, 12:47 a.m. FINDINGS: Gross cardiomegaly. Diffuse bilateral interstitial pulmonary opacity. Interval placement right neck multi lumen vascular catheter, tip near the superior cavoatrial junction. Osseous structures unremarkable. IMPRESSION: 1. Interval placement right neck multi lumen vascular catheter, tip near the superior cavoatrial junction. 2. Gross cardiomegaly and diffuse bilateral interstitial pulmonary opacity, most consistent with edema. Electronically Signed   By: Jearld Lesch M.D.   On: 02/05/2023 07:49   DG Chest Portable 1 View  Result Date: 02/05/2023 CLINICAL DATA:  Weakness, shortness of breath EXAM: PORTABLE CHEST 1 VIEW COMPARISON:  02/21/2022 FINDINGS: Cardiomegaly, vascular congestion. Very low lung volumes. Diffuse bilateral airspace disease, right greater than left. No  visible effusions or pneumothorax. No acute bony abnormality. IMPRESSION: Cardiomegaly with diffuse bilateral airspace disease, right greater than left. Favor edema although infection cannot be excluded. Electronically Signed   By: Charlett Nose M.D.   On: 02/05/2023 01:05   CT Renal Stone Study  Result Date: 02/04/2023 CLINICAL DATA:  Left lower quadrant pain, diarrhea, and abdominal distention. EXAM: CT ABDOMEN AND PELVIS WITHOUT CONTRAST TECHNIQUE: Multidetector CT imaging of the abdomen and pelvis was performed following the standard protocol without IV contrast. RADIATION DOSE REDUCTION: This exam was performed according to the departmental dose-optimization program which includes automated exposure control, adjustment of the mA and/or kV according to patient size and/or use of iterative reconstruction technique. COMPARISON:  09/07/2014. FINDINGS: Lower chest: The heart is enlarged and there is a moderate pericardial effusion measuring up to 1.8 cm. There are small bilateral pleural effusions, greater on the right than on the left. Atelectasis is present at the lung bases. Hepatobiliary: No focal liver abnormality is seen. No gallstones, gallbladder wall thickening, or biliary dilatation. Pancreas: Unremarkable. No pancreatic ductal dilatation or surrounding inflammatory changes. Spleen: Normal in size without focal abnormality. Adrenals/Urinary Tract: The adrenal glands are within normal limits. Hyperdense material is noted in the renal collecting system in the lower pole of the left kidney. No hydroureteronephrosis. Hyperdense material is noted in the urinary bladder on the left in the region of the ureterovesicular junction. Stomach/Bowel: Stomach is within normal limits. Appendix appears normal. No evidence of bowel wall thickening, distention, or inflammatory changes. No free air or pneumatosis. Vascular/Lymphatic: Aortic atherosclerosis. No obvious abdominal or pelvic lymphadenopathy. Examination is  limited due to ascites. Reproductive: The prostate gland is enlarged. Other: Large ascites and severe anasarca. Musculoskeletal: Degenerative changes are present in the thoracolumbar spine. No acute osseous abnormality. IMPRESSION: 1. Hyperdense material in the collecting system in the lower pole of the left kidney, possible blood products versus calcification. A small amount hyperdense material is noted in the urinary bladder on the left near the UVJ, possible blood products. Correlation with urinalysis is recommended. 2. Cardiomegaly with moderate pericardial effusion. 3. Small bilateral pleural effusions with atelectasis at the lung bases. 4. Large ascites. 5. Severe anasarca.  6. Enlarged prostate gland. 7. Aortic atherosclerosis. Electronically Signed   By: Thornell Sartorius M.D.   On: 02/04/2023 23:05     Subjective: No acute issues/events overnight   Discharge Exam: Vitals:   02/14/23 1550 02/14/23 1630  BP: (!) 180/98 (!) 183/107  Pulse: 83 81  Resp: (!) 21 (!) 23  Temp:    SpO2: 100% 100%   Vitals:   02/14/23 1540 02/14/23 1550 02/14/23 1600 02/14/23 1630  BP: (!) 165/93 (!) 180/98  (!) 183/107  Pulse: 81 83  81  Resp: (!) 21 (!) 21  (!) 23  Temp: 98.3 F (36.8 C)     TempSrc:      SpO2: 100% 100%  100%  Weight:   (S) 90.5 kg   Height:        General exam: No acute distress Respiratory system: Clear to auscultation. Respiratory effort normal. Cardiovascular system: S1 & S2 heard, RRR.  Gastrointestinal system: Abdomen is nondistended, soft and nontender.  Central nervous system: Alert and oriented. No focal neurological deficits. Extremities: Symmetric 5 x 5 power without painful passive/active ROM testing. R foot bandage clean/dry/intact   The results of significant diagnostics from this hospitalization (including imaging, microbiology, ancillary and laboratory) are listed below for reference.     Microbiology: Recent Results (from the past 240 hour(s))  Blood culture  (routine x 2)     Status: None   Collection Time: 02/04/23  9:07 PM   Specimen: BLOOD RIGHT FOREARM  Result Value Ref Range Status   Specimen Description   Final    BLOOD RIGHT FOREARM Performed at [facility], [address], [place], [name] [address]    Special Requests   Final    Blood Culture adequate volume BOTTLES DRAWN AEROBIC AND ANAEROBIC Performed at [facility], [address], [place], [name] [address]    Culture   Final    NO GROWTH 5 DAYS Performed at [facility], [address], [place], [name] [address]    Report Status 02/10/2023 FINAL  Final  Blood culture (routine x 2)     Status: None   Collection Time: 02/04/23  9:26 PM   Specimen: BLOOD LEFT FOREARM  Result Value Ref Range Status   Specimen Description   Final    BLOOD LEFT FOREARM Performed at [facility], [address], [place], [name] [address]    Special Requests   Final    Blood Culture adequate volume BOTTLES DRAWN AEROBIC AND ANAEROBIC Performed at [facility], [address], [place], [name] [address]    Culture   Final    NO GROWTH 5 DAYS Performed at [facility], [address], [place], [name] [address]    Report Status 02/10/2023 FINAL  Final  Urine Culture     Status: Abnormal   Collection Time: 02/05/23 12:08 AM   Specimen: Urine, Random  Result Value Ref Range Status   Specimen Description   Final    URINE, RANDOM Performed at [facility], [address] [place], [name] [address]    Special Requests   Final    NONE Performed at [facility], [address], [place], [name] [address]    Culture MULTIPLE SPECIES PRESENT, SUGGEST RECOLLECTION (A)  Final   Report Status 02/07/2023 FINAL  Final  MRSA Next Gen by PCR, Nasal     Status: Abnormal   Collection Time: 02/05/23  9:30  AM   Specimen: Nasal Mucosa; Nasal Swab  Result Value Ref Range Status   MRSA by PCR Next Gen  DETECTED (A) NOT DETECTED Final    Comment: (NOTE) The GeneXpert MRSA Assay (FDA approved for NASAL specimens only), is one component of a comprehensive MRSA colonization surveillance program. It is not intended to diagnose MRSA infection nor to guide or monitor treatment for MRSA infections. Test performance is not FDA approved in patients less than 36 years old. Performed at [facility], [address], [place], [name] [address]   Culture, blood (Routine X 2) w Reflex to ID Panel     Status: None   Collection Time: 02/06/23  3:45 PM   Specimen: BLOOD LEFT HAND  Result Value Ref Range Status   Specimen Description BLOOD LEFT HAND  Final   Special Requests   Final    BOTTLES DRAWN AEROBIC ONLY Blood Culture results may not be optimal due to an inadequate volume of blood received in culture bottles   Culture   Final    NO GROWTH 5 DAYS Performed at [facility], [address], [place], [name] [address]    Report Status 02/11/2023 FINAL  Final  Culture, blood (Routine X 2) w Reflex to ID Panel     Status: None   Collection Time: 02/06/23  3:45 PM   Specimen: BLOOD RIGHT HAND  Result Value Ref Range Status   Specimen Description BLOOD RIGHT HAND  Final   Special Requests   Final    BOTTLES DRAWN AEROBIC ONLY Blood Culture results may not be optimal due to an inadequate volume of blood received in culture bottles   Culture   Final    NO GROWTH 5 DAYS Performed at [facility], [address], [place], [name] [address]    Report Status 02/11/2023 FINAL  Final     Labs: BNP (last 3 results) Recent Labs    06/21/22 1223 02/04/23 2107 02/05/23 0451  BNP >4,500.0* 2,740.2* 2,575.0*   Basic Metabolic Panel: Recent Labs  Lab 02/10/23 0044 02/11/23 0330 02/11/23 2241 02/12/23 0351 02/13/23 0320 02/14/23 0136  NA 137 136 138 134* 136 133*  K 4.4 4.2 4.7 4.6 3.9 4.1  CL 105 102 103 101 100 98  CO2 22 21* 22 21* 25 23  GLUCOSE 104* 140*  151* 162* 89 121*  BUN 49* 55* 59* 59* 38* 44*  CREATININE 4.45* 4.96* 5.48* 5.31* 3.99* 4.52*  CALCIUM 7.3* 7.4* 7.6* 7.4* 7.5* 7.6*  MG 1.6* 1.6*  --  1.7 1.4* 1.5*  PHOS 2.9 2.6 2.8 2.6 2.1* 2.4*   Liver Function Tests: Recent Labs  Lab 02/11/23 0330 02/11/23 2241 02/12/23 0351 02/13/23 0320 02/14/23 0136  ALBUMIN 2.2* 2.1* 2.2* 2.0* 2.0*   No results for input(s): "LIPASE", "AMYLASE" in the last 168 hours. No results for input(s): "AMMONIA" in the last 168 hours. CBC: Recent Labs  Lab 02/08/23 0119 02/09/23 0706 02/11/23 2241 02/12/23 0739  WBC 8.3 7.1 6.2 7.6  NEUTROABS  --  6.3  --   --   HGB 7.7* 7.8* 7.6* 7.6*  HCT 25.2* 24.8* 25.1* 25.8*  MCV 84.0 83.5 86.6 90.2  PLT 99* 101* 92* 106*   Cardiac Enzymes: No results for input(s): "CKTOTAL", "CKMB", "CKMBINDEX", "TROPONINI" in the last 168 hours. BNP: Invalid input(s): "POCBNP" CBG: Recent Labs  Lab 02/13/23 1205 02/13/23 1604 02/13/23 2009 02/14/23 0818 02/14/23 1136  GLUCAP 116* 104* 131* 135* 167*  D-Dimer No results for input(s): "DDIMER" in the last 72 hours. Hgb A1c No results for input(s): "HGBA1C" in the last 72 hours. Lipid Profile No results for input(s): "CHOL", "HDL", "LDLCALC", "TRIG", "CHOLHDL", "LDLDIRECT" in the last 72 hours. Thyroid function studies No results for input(s): "TSH", "T4TOTAL", "T3FREE", "THYROIDAB" in the last 72 hours.  Invalid input(s): "FREET3" Anemia work up No results for input(s): "VITAMINB12", "FOLATE", "FERRITIN", "TIBC", "IRON", "RETICCTPCT" in the last 72 hours. Urinalysis    Component Value Date/Time   COLORURINE YELLOW 02/05/2023 0008   APPEARANCEUR TURBID (A) 02/05/2023 0008   LABSPEC 1.015 02/05/2023 0008   PHURINE 5.0 02/05/2023 0008   GLUCOSEU NEGATIVE 02/05/2023 0008   HGBUR LARGE (A) 02/05/2023 0008   BILIRUBINUR NEGATIVE 02/05/2023 0008   KETONESUR NEGATIVE 02/05/2023 0008   PROTEINUR 100 (A) 02/05/2023 0008   UROBILINOGEN 1.0 09/06/2014  2307   NITRITE NEGATIVE 02/05/2023 0008   LEUKOCYTESUR MODERATE (A) 02/05/2023 0008   Sepsis Labs Recent Labs  Lab 02/08/23 0119 02/09/23 0706 02/11/23 2241 02/12/23 0739  WBC 8.3 7.1 6.2 7.6   Microbiology Recent Results (from the past 240 hour(s))  Blood culture (routine x 2)     Status: None   Collection Time: 02/04/23  9:07 PM   Specimen: BLOOD RIGHT FOREARM  Result Value Ref Range Status   Specimen Description   Final    BLOOD RIGHT FOREARM Performed at [facility], [address], [place], [name] [address]    Special Requests   Final    Blood Culture adequate volume BOTTLES DRAWN AEROBIC AND ANAEROBIC Performed at [facility], [address] [place], [name] [address]    Culture   Final    NO GROWTH 5 DAYS Performed at [facility], [address] [place], [name] [address]    Report Status 02/10/2023 FINAL  Final  Blood culture (routine x 2)     Status: None   Collection Time: 02/04/23  9:26 PM   Specimen: BLOOD LEFT FOREARM  Result Value Ref Range Status   Specimen Description   Final    BLOOD LEFT FOREARM Performed at [facility], [address], [place], [name] [address]    Special Requests   Final    Blood Culture adequate volume BOTTLES DRAWN AEROBIC AND ANAEROBIC Performed at [facility], [address] [place], [name] [address]    Culture   Final    NO GROWTH 5 DAYS Performed at [facility], [address], [place], [name] [address]    Report Status 02/10/2023 FINAL  Final  Urine Culture     Status: Abnormal   Collection Time: 02/05/23 12:08 AM   Specimen: Urine, Random  Result Value Ref Range Status   Specimen Description   Final    URINE, RANDOM Performed at [facility], [address], [place], [name] [address]    Special Requests   Final    NONE Performed at [facility], [address] [place], [name] [address]    Culture  MULTIPLE SPECIES PRESENT, SUGGEST RECOLLECTION (A)  Final   Report Status 02/07/2023 FINAL  Final  MRSA Next Gen by PCR, Nasal     Status: Abnormal   Collection Time: 02/05/23  9:30 AM   Specimen: Nasal Mucosa; Nasal Swab  Result Value Ref Range Status   MRSA by PCR Next Gen DETECTED (A) NOT DETECTED Final    Comment: (NOTE) The GeneXpert MRSA  Assay (FDA approved for NASAL specimens only), is one component of a comprehensive MRSA colonization surveillance program. It is not intended to diagnose MRSA infection nor to guide or monitor treatment for MRSA infections. Test performance is not FDA approved in patients less than 56 years old. Performed at [facility], [address] [place], [name] [address]   Culture, blood (Routine X 2) w Reflex to ID Panel     Status: None   Collection Time: 02/06/23  3:45 PM   Specimen: BLOOD LEFT HAND  Result Value Ref Range Status   Specimen Description BLOOD LEFT HAND  Final   Special Requests   Final    BOTTLES DRAWN AEROBIC ONLY Blood Culture results may not be optimal due to an inadequate volume of blood received in culture bottles   Culture   Final    NO GROWTH 5 DAYS Performed at [facility], [address], [place], [name] [address]    Report Status 02/11/2023 FINAL  Final  Culture, blood (Routine X 2) w Reflex to ID Panel     Status: None   Collection Time: 02/06/23  3:45 PM   Specimen: BLOOD RIGHT HAND  Result Value Ref Range Status   Specimen Description BLOOD RIGHT HAND  Final   Special Requests   Final    BOTTLES DRAWN AEROBIC ONLY Blood Culture results may not be optimal due to an inadequate volume of blood received in culture bottles   Culture   Final    NO GROWTH 5 DAYS Performed at [facility], [address] [place], [name] [address]    Report Status 02/11/2023 FINAL  Final     Time coordinating discharge: Over 30 minutes  SIGNED:   Azucena Fallen, DO [facility] 02/14/2023, 4:57 PM Pager   If 7PM-7AM, please contact night-coverage [email or web address]

## 2023-02-14 NOTE — Progress Notes (Signed)
Report called to [facility], [address] [place] to nurse Juanito Doom for RM 319.

## 2023-02-14 NOTE — TOC Progression Note (Addendum)
Transition of Care [facility]) - Progression Note    Patient Details  Name: Todd Mccoy MRN: [id number] Date of Birth: 13-Jul-1970  [facility] CM/SW Contact   A [place], [place] Phone Number: 02/14/2023, 2:40 PM  Clinical Narrative:     CSW met with pt at bedside to discuss bed offers to pt. He said he was ok with any referral to get him taken care of. He said [facility] would be fine as he thought it was get him out the fastest.   CSW notified treatment team and facility of pt choice and possible DC today.   TOC will continue to follow.   CSW contacted pt's mother, Todd Mccoy to provide bed offers for SNF and LTAC, [facility]. She stated that she wanted "whatever the best care would be so he won't return home sick." She said that whether LTAC or SNF it did not make a difference to her.   TOC will continue to follow.   Expected Discharge Plan: Skilled Nursing Facility Barriers to Discharge: Insurance Authorization, SNF Pending bed offer, Continued Medical Work up  Expected Discharge Plan and Services       Living arrangements for the past 2 months: Single Family Home                                       Social Determinants of Health (SDOH) Interventions SDOH Screenings   Food Insecurity: Patient Unable To Answer (02/06/2023)  Housing: High Risk (02/06/2023)  Transportation Needs: Patient Unable To Answer (02/06/2023)  Utilities: Not At Risk (07/14/2022)  Depression (PHQ2-9): Low Risk  (02/24/2022)  Financial Resource Strain: Low Risk  (09/30/2021)  Physical Activity: Inactive (09/30/2021)  Social Connections: Moderately Isolated (09/30/2021)  Stress: No Stress Concern Present (09/30/2021)  Tobacco Use: High Risk (02/04/2023)    Readmission Risk Interventions     No data to display

## 2023-02-14 NOTE — Progress Notes (Signed)
Patient was discharged from the hospital at 2300. To [facility], Pt was transported by [facility]. Bed time meds administered. Attempted to call [facility] with ETA. No answer.

## 2023-02-16 ENCOUNTER — Other Ambulatory Visit: Payer: Medicare Other | Admitting: *Deleted

## 2023-02-16 NOTE — Patient Outreach (Signed)
Care Management/Care Coordination  Managed Medicaid Care Management Case Closure Note  02/16/2023 Name: Todd Mccoy MRN: [id number] DOB: Nov 16, 1970  Todd Mccoy is a 52 y.o. year old male who is a primary care patient of [name], MD. The care management/care coordination team was consulted for assistance with chronic disease management and/or care coordination needs.   Care Plan : RN Care Manager Plan of Care  Updates made by Heidi Dach, RN since 02/16/2023 12:00 AM  Completed 02/16/2023   Problem: Chronic Disease Management and Care Coordination Needs for CHF, HTN, DM Resolved 02/16/2023  Priority: High     Long-Range Goal: Development of Plan of Care for Chronic Disease Management and Care Coordination Needs (CHF, HTN, DM) Completed 02/16/2023  Start Date: 09/30/2021  Expected End Date: 03/04/2023  Priority: High  Note:   Current Barriers:  Knowledge Deficits related to plan of care for management of CHF, HTN, and DMII  Chronic Disease Management support and education needs related to CHF, HTN, and DMII Difficulty obtaining medications Recent Vision 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 Patient discharged to Long Term Care Facility  RNCM Clinical Goal(s):  Patient will verbalize understanding of plan for management of CHF, HTN, and DMII as evidenced by improvement in management of these chronic diseases. verbalize basic understanding of CHF, HTN, and DMII disease process and self health management plan as evidenced by improved control of factors that cause CHF, improved blood pressure readings < 140/90 and improved control of blood sugar readings with range with 80 - 120. take all medications exactly as prescribed and will call provider for medication related questions as evidenced by compliance with medications    attend all scheduled medical appointments:  11/19/22 for Lung CT and 11/26/22 with VVS and 12/29/22 with  Cardiology as evidenced by attending all scheduled appointments        demonstrate improved adherence to prescribed treatment plan for CHF, HTN, and DMII as evidenced by better control of blood pressure, blood sugars and factors impacting CHF. continue to work with Medical illustrator and/or Social Worker to address care management and care coordination needs related to CHF, HTN, and DMII as evidenced by adherence to CM Team Scheduled appointments     through collaboration with Medical illustrator, provider, and care team.   Interventions: Inter-disciplinary care team collaboration (see longitudinal plan of care) Evaluation of current treatment plan related to  self management and patient's adherence to plan as established by provider Verified referral placed and advised patient to call and schedule visit with [facility] Assisted with MyChart sign up-resent link to cell phone Collaborated with Pharmacist regarding patient medication concerns  Heart Failure Interventions:  (Status: Goal on Track (progressing): YES.)  Long Term Goal  Wt Readings from Last 3 Encounters:  11/26/22 209 lb (94.8 kg)  10/09/22 209 lb (94.8 kg)  09/21/22 209 lb (94.8 kg)    Reviewed Heart Failure Action Plan in depth and provided written copy Discussed importance of daily weight and advised patient to weigh and record daily Discussed the importance of keeping all appointments with provider Assessed social determinant of health barriers Reviewed medications Advised patient to take all medications and BP readings to all upcoming appointments Reviewed upcoming appointments including appointments listed above and verified patient has transportation-provided details to patient and family   Hypertension: (Status: Condition stable. Not addressed this visit.) Long Term Goal Patient will have someone pick up  Amlodipine today Last practice recorded BP readings:  BP Readings from Last 3 Encounters:  11/26/22  (!) 172/105  10/27/22 128/80  10/09/22 (!) 162/102   Most recent eGFR/CrCl:  Lab Results  Component Value Date   EGFR 12 (L) 10/05/2022    No components found for: "CRCL"  Provided education to patient re: stroke prevention, s/s of heart attack and stroke; Reviewed medications with patient and discussed importance of compliance;  Advised patient, providing education and rationale, to monitor blood pressure daily and record, calling PCP for findings outside established parameters;  Reviewed scheduled/upcoming provider appointments including: 01/19/23 with Wound Care, 01/28/23 with [facility] and reschedule with [facility] Discussed the importance of checking BP at home Ensured patient received Amlodipine and now taking as directed Advised patient call pharmacy of choice for refills, request all medications transferred to preferred pharmacy Advised patient to take medications as directed and check BP daily at the same time of day, record and take to provider visit    Patient Goals/Self-Care Activities: Take medications as prescribed   Attend all scheduled provider appointments Call pharmacy for medication refills 3-7 days in advance of running out of medications Call provider office for new concerns or questions  check blood sugar at prescribed times: once daily check blood pressure daily limit salt intake to 2000 mg/day       Plan:  Patient discharged to Long Term Care Facility during recent admission  Estanislado Emms RN, [name] [place]  Managed [facility] RN Care Coordinator [phone]

## 2023-02-16 NOTE — Progress Notes (Signed)
Late Note Entry- February 16, 2023  Pt d/c to [facility] on Monday evening. Contacted [facility] admissions and local clinic to provide this information/update this morning.   Olivia Canter Renal Navigator [phone]

## 2023-02-17 DIAGNOSIS — I5020 Unspecified systolic (congestive) heart failure: Secondary | ICD-10-CM | POA: Diagnosis not present

## 2023-02-19 DIAGNOSIS — N186 End stage renal disease: Secondary | ICD-10-CM | POA: Insufficient documentation

## 2023-02-21 ENCOUNTER — Telehealth: Payer: Self-pay

## 2023-02-21 ENCOUNTER — Other Ambulatory Visit: Payer: Medicare Other

## 2023-02-21 NOTE — Transitions of Care (Post Inpatient/ED Visit) (Signed)
Error

## 2023-02-21 NOTE — Telephone Encounter (Signed)
This encounter was created in error - please disregard.

## 2023-02-21 NOTE — Transitions of Care (Post Inpatient/ED Visit) (Signed)
   02/21/2023  Name: Todd Mccoy MRN: [id number] DOB: [birth year]  Today's TOC FU Call Status:    Attempted to reach the patient regarding the most recent Inpatient/ED visit. Spoke with patient who was not available to speak at this time and requested call back around 11:30 am today.    Follow Up Plan: Additional outreach attempts will be made to reach the patient to complete the Transitions of Care (Post Inpatient/ED visit) call. RNCM to contact patient 11:30 am 8/19.    Mahli Glahn J. Cristela Felt, RN, [name], MSN Care Management Coordinator/[place] Phone Number:  [phone]

## 2023-02-21 NOTE — Addendum Note (Signed)
Addended by: Amada Kingfisher on: 02/21/2023 02:40 PM   Modules accepted: Orders, Level of Service

## 2023-02-21 NOTE — Transitions of Care (Post Inpatient/ED Visit) (Signed)
02/21/2023  Name: Todd Mccoy MRN: [id number] DOB: [birth year]  Today's TOC FU Call Status:  Patient agreed to engage in 30 day TOC program.    Transition Care Management Follow-up Telephone Call Discharge Facility: [facility]) Type of Discharge: Inpatient Admission Primary Inpatient Discharge Diagnosis:: " How have you been since you were released from the hospital?: Better Any questions or concerns?: No  Items Reviewed: Did you receive and understand the discharge instructions provided?: Yes Any new allergies since your discharge?: No Dietary orders reviewed?: No  Medications Reviewed Today: Medications Reviewed Today     Reviewed by Amada Kingfisher, RN (Registered Nurse) on 02/21/23 at 1143  Med List Status: <None>   Medication Order Taking? Sig Documenting Provider Last Dose Status Informant  acetaminophen (TYLENOL) 325 MG tablet [id number] Yes Take 2 tablets (650 mg total) by mouth every 6 (six) hours as needed for mild pain. Azucena Fallen, MD Taking Active   atorvastatin (LIPITOR) 20 MG tablet [id number] Yes Take 1 tablet (20 mg total) by mouth daily. Quintella Reichert, MD Taking Active   calcitRIOL (ROCALTROL) 0.25 MCG capsule [id number] Yes Take 0.25 mcg by mouth daily. [provider] Taking Active   cetirizine (ZYRTEC ALLERGY) 10 MG tablet [id number]  Take 1 tablet (10 mg total) by mouth daily. Rondel Baton, MD  Expired 11/08/22 2359   docusate sodium (COLACE) 100 MG capsule [id number] Yes Take 1 capsule (100 mg total) by mouth 2 (two) times daily as needed for mild constipation. Azucena Fallen, MD Taking Active   ferrous sulfate 325 (65 FE) MG EC tablet [id number] Yes Take 325 mg by mouth daily with breakfast. [provider] Taking Active   furosemide (LASIX) 40 MG tablet [id number] Yes Take 1 tablet (40 mg total) by mouth 2 (two) times daily. Azucena Fallen, MD Taking Active   hydrALAZINE (APRESOLINE) 50 MG tablet [id number] Yes  Take 1 tablet (50 mg total) by mouth 3 (three) times daily. Azucena Fallen, MD Taking Active   hydrOXYzine (ATARAX) 50 MG tablet [id number] Yes Take 1 tablet (50 mg total) by mouth 3 (three) times daily as needed for anxiety or itching. Azucena Fallen, MD Taking Active     Discontinued 05/13/20 1120   isosorbide mononitrate (IMDUR) 60 MG 24 hr tablet [id number] Yes Take 1 tablet (60 mg total) by mouth daily. Azucena Fallen, MD Taking Active   magnesium oxide (MAG-OX) 400 (240 Mg) MG tablet [id number] Yes Take 1 tablet (400 mg total) by mouth 2 (two) times daily. Azucena Fallen, MD Taking Active     Discontinued 12/22/20 1205 (Stop Taking at Discharge)   multivitamin (RENA-VIT) TABS tablet [id number] Yes Take 1 tablet by mouth at bedtime. Azucena Fallen, MD Taking Active   Naphazoline HCl (CLEAR EYES OP) [id number] Yes Place 1 drop into both eyes as needed (irritation). [provider] Taking Active Self  polyethylene glycol (MIRALAX / GLYCOLAX) 17 g packet [id number] Yes Take 17 g by mouth daily as needed for moderate constipation. Azucena Fallen, MD Taking Active             Home Care and Equipment/Supplies:  None    Functional Questionnaire: Do you need assistance with bathing/showering or dressing?: No Do you need assistance with meal preparation?: No Do you need assistance with eating?: No Do you have difficulty maintaining continence: No Do you need assistance with getting out of bed/getting out of a chair/moving?: No Do you have difficulty managing  or taking your medications?: No  Follow up appointments reviewed:  Consented to transition of care 30 day program - next [facility] telephonic visit scheduled for Monday, 02/28/2023.  [facility] - D[facility] - Tuesday, Thursday, Saturday - provided [facility] with [facility] contact information for verify appointment time.  [facility] to contact [facility] today.     Current Barriers:   Knowledge Deficits related to plan of care for management of CHF and CKD Stage IV   RNCM Clinical Goal(s):  Patient will verbalize understanding in management of condition/disease processes, therapeutic needs, and potential complications associated with CHF/CKD IV through collaboration with RN Care manager, provider, and care team.   Interventions: Evaluation of current treatment plan related to  self management and patient's adherence to plan as established by provider.  Patient Goals/Self-Care Activities: Take medications as prescribed   Attend all scheduled provider appointments Attend Dialysis appointments as ordered Call pharmacy for medication refills 3-7 days in advance of running out of medications Call provider office for new concerns or questions  call office if I gain more than 2 pounds in one day or 5 pounds in one week use salt in moderation watch for swelling in feet, ankles and legs every day weigh myself daily Follow Low sodium diet and increase your activity as you feel able   Next Transition of Care Visit scheduled with RNCM on Wednesday, 11 am. 03/02/2023.   Stephfon Bovey J. Cristela Felt, RN, [name], MSN Care Management Coordinator/ Phone Number:  [phone]

## 2023-02-22 ENCOUNTER — Inpatient Hospital Stay: Payer: Medicare Other | Admitting: Critical Care Medicine

## 2023-02-24 DIAGNOSIS — N2581 Secondary hyperparathyroidism of renal origin: Secondary | ICD-10-CM | POA: Insufficient documentation

## 2023-03-02 ENCOUNTER — Telehealth: Payer: Self-pay

## 2023-03-02 ENCOUNTER — Other Ambulatory Visit: Payer: Self-pay | Admitting: Cardiology

## 2023-03-02 ENCOUNTER — Other Ambulatory Visit: Payer: Medicare Other

## 2023-03-02 NOTE — Patient Outreach (Signed)
Care Management  Transitions of Care Program Transitions of Care Post-discharge week 2   03/02/2023 Name: Todd Mccoy MRN: [id number] DOB: 08/13/1970  Subjective: Todd Mccoy is a 52 y.o. year old male who is a primary care patient of Todd Register, MD. The Care Management team Engaged with patient Engaged with patient by telephone to assess and address transitions of care needs.   Consent to Services:  Patient was given information about care management services, agreed to services, and gave verbal consent to participate.   Assessment:           SDOH Interventions    Flowsheet Row Telephone from 03/02/2023 in [place] POPULATION HEALTH DEPARTMENT Patient Outreach Telephone from 11/03/2022 in [place] POPULATION HEALTH DEPARTMENT Patient Outreach Telephone from 10/15/2022 in [place] POPULATION HEALTH DEPARTMENT Patient Outreach Telephone from 07/14/2022 in [place] POPULATION HEALTH DEPARTMENT Patient Outreach Telephone from 06/14/2022 in  POPULATION HEALTH DEPARTMENT Patient Outreach Telephone from 03/22/2022 in [facility] Care Coordination  SDOH Interventions        Food Insecurity Interventions -- Intervention Not Indicated -- Intervention Not Indicated -- --  Housing Interventions -- Intervention Not Indicated -- Intervention Not Indicated -- Intervention Not Indicated  Transportation Interventions Intervention Not Indicated -- Intervention Not Indicated Intervention Not Indicated Intervention Not Indicated Intervention Not Indicated  Utilities Interventions -- -- -- Intervention Not Indicated -- --        Goals Addressed               This Visit's Progress     Patient Stated (pt-stated)   On track     Transition of Care   On track     Current Barriers:  Knowledge Deficits related to plan of care for management of CHF and CKD Stage IV   RNCM Clinical Goal(s):  Patient will verbalize understanding in  management of the condition/disease process, therapeutic needs, and potential complications of CHF/CKD IV through collaboration with RN Care manager, provider, and care team.   Interventions: Evaluation of current treatment plan related to  self management and patient's adherence to plan as established by provider.   Heart Failure Interventions:  (Status:  New goal.) Short Term Goal Provided education on low sodium diet Assessed need for readable accurate scales in home Advised patient to weigh each morning after emptying bladder Discussed importance of daily weight and advised patient to weigh and record daily   Chronic Kidney Disease Interventions:  (Status:  New goal.) Short Term Goal Evaluation of current treatment plan related to chronic kidney disease self management and patient's adherence to plan as established by provider      Last practice recorded BP readings:  BP Readings from Last 3 Encounters:  02/14/23 (!) 155/94  11/26/22 (!) 172/105  10/27/22 128/80   Most recent eGFR/CrCl:  Lab Results  Component Value Date   EGFR 12 (L) 10/05/2022    No components found for: "CRCL"   Patient Goals/Self-Care Activities: Take medications as prescribed   Attend all scheduled provider appointments Call pharmacy for medication refills 3-7 days in advance of running out of medications Call provider office for new concerns or questions  call office if I gain more than 2 pounds in one day or 5 pounds in one week use salt in moderation watch for swelling in feet, ankles and legs every day weigh myself daily  Plan: Telephone follow up appointment with care management team member scheduled for: Wednesday, 03/09/2023 9 am  Todd Mccoy J. Cristela Felt, RN, [name], MSN Care Management Coordinator/[place] Phone Number: [phone]

## 2023-03-02 NOTE — Patient Instructions (Signed)
Visit Information  Thank you for taking time to visit with me today. Please don't hesitate to contact me if I can be of assistance to you before our next scheduled telephone appointment.  Our next appointment is by telephone on Wednesday, 03/09/2023 at 9 am.   Following is a copy of your care plan:   Goals Addressed               This Visit's Progress     Patient Stated (pt-stated)   On track     Transition of Care   On track     Current Barriers:  Knowledge Deficits related to plan of care for management of CHF and CKD Stage IV   RNCM Clinical Goal(s):  Patient will verbalize understanding in management of the condition/disease process, therapeutic needs, and potential complications of CHF/CKD IV through collaboration with RN Care manager, provider, and care team.   Interventions: Evaluation of current treatment plan related to  self management and patient's adherence to plan as established by provider.   Heart Failure Interventions:  (Status:  Goal on track:  Yes.) Long Term Goal Basic overview and discussion of pathophysiology of Heart Failure reviewed Provided education on low sodium diet Discussed importance of daily weight and advised patient to weigh and record daily Reviewed role of diuretics in prevention of fluid overload and management of heart failure;     Chronic Kidney Disease Interventions:  (Status:  Goal on track:  Yes.) Long Term Goal Assessed the Patient understanding of chronic kidney disease    Reviewed medications with patient and discussed importance of compliance    Last practice recorded BP readings:  BP Readings from Last 3 Encounters:  02/14/23 (!) 155/94  11/26/22 (!) 172/105  10/27/22 128/80   Most recent eGFR/CrCl:  Lab Results  Component Value Date   EGFR 12 (L) 10/05/2022    No components found for: "CRCL"    Diabetes Interventions:  (Status:  Goal on track:  Yes.) Long Term Goal Assessed patient's understanding of A1c goal:  <7% Reviewed medications with patient and discussed importance of medication adherence Lab Results  Component Value Date   HGBA1C 5.6 02/06/2023   Reports check blood glucose levels regularly - reports running in 130"s   Patient Goals/Self-Care Activities: Take medications as prescribed   Attend all scheduled provider appointments Call pharmacy for medication refills 3-7 days in advance of running out of medications Call provider office for new concerns or questions  call office if I gain more than 2 pounds in one day or 5 pounds in one week use salt in moderation watch for swelling in feet, ankles and legs every day weigh myself daily                       Patient verbalizes understanding of instructions and care plan provided today and agrees to view in MyChart. Active MyChart status and patient understanding of how to access instructions and care plan via MyChart confirmed with patient.     Telephone follow up appointment with care management team member scheduled for: Wednesday, 03/09/2023 9 am   Please call the care guide team at [phone] if you need to cancel or reschedule your appointment.   Please call the Suicide and Crisis Lifeline: 988 if you are experiencing a Mental Health or Behavioral Health Crisis or need someone to talk to. Seger Jani J. Cristela Felt, RN, [name], MSN Care Management Coordinator/[place] Phone Number:  [phone]

## 2023-03-04 ENCOUNTER — Telehealth: Payer: Self-pay | Admitting: Family Medicine

## 2023-03-04 NOTE — Telephone Encounter (Signed)
Home Health Verbal Orders - Caller/Agency: Enrique Sack from [facility] Number: [phone]  Requesting Therapy: PT  Frequency: 1x8w

## 2023-03-08 NOTE — Telephone Encounter (Signed)
Spoke with Enrique Sack from [facility]   Requesting Therapy: PT   Frequency: 1x8w   Order given for  PT Frequency: 1x8w

## 2023-03-09 ENCOUNTER — Other Ambulatory Visit: Payer: Medicare Other

## 2023-03-09 ENCOUNTER — Telehealth: Payer: Self-pay

## 2023-03-09 NOTE — Patient Instructions (Addendum)
Visit Information  Thank you for taking time to visit with me today. Please don't hesitate to contact me if I can be of assistance to you before our next scheduled telephone appointment.  Our next appointment is by telephone on Friday, March 18, 2023 at 10 am   Following is a copy of your care plan:   Goals Addressed             This Visit's Progress    Transition of Care       Current Barriers:  Knowledge Deficits related to plan of care for management of CHF and CKD Stage IV   RNCM Clinical Goal(s):  Patient will verbalize understanding in management of the condition/disease process, therapeutic needs, and potential complications of CHF/CKD IV through collaboration with RN Care manager, provider, and care team.   Interventions: Evaluation of current treatment plan related to  self management and patient's adherence to plan as established by provider.  Marland Kitchen Heart Failure Interventions:  (Status:  Goal on track:  Yes.) Long Term Goal Basic overview and discussion of pathophysiology of Heart Failure reviewed Provided education on low sodium diet Discussed importance of daily weight and advised patient to weigh and record daily Reviewed role of diuretics in prevention of fluid overload and management of heart failure;      Chronic Kidney Disease Interventions:  (Status:  Goal on track:  Yes.) Long Term Goal Assessed the Patient understanding of chronic kidney disease    Reviewed medications with patient and discussed importance of compliance    Assessed patient transportation resources to dialysis appointment. No identified barrier.   Last practice recorded BP readings:  BP Readings from Last 3 Encounters:  02/14/23 (!) 155/94  11/26/22 (!) 172/105  10/27/22 128/80   Most recent eGFR/CrCl:  Lab Results  Component Value Date   EGFR 12 (L) 10/05/2022    No components found for: "CRCL"    Diabetes Interventions:  (Status:  Goal on track:  Yes.) Long Term Goal Assessed  patient's understanding of A1c goal: <7% Reviewed medications with patient and discussed importance of medication adherence Assess recent blood glucose levels- reports check blood glucose levels regularly - reports running in 130"s   Patient Goals/Self-Care Activities: Take medications as prescribed   Attend all scheduled provider appointments Call pharmacy for medication refills 3-7 days in advance of running out of medications Call provider office for new concerns or questions  call office if I gain more than 2 pounds in one day or 5 pounds in one week use salt in moderation watch for swelling in feet, ankles and legs every day weigh myself daily Attend all scheduled dialysis appointments. Call RNCM with any transportation needs to schedule provider/dialysis appointments.                        Patient verbalizes understanding of instructions and care plan provided today and agrees to view in MyChart. Active MyChart status and patient understanding of how to access instructions and care plan via MyChart confirmed with patient.     Telephone follow up appointment with care management team member scheduled for: Friday, March 18, 2023 at 10 am   Please call the care guide team at [phone] if you need to cancel or reschedule your appointment.   Please call the [place] National Suicide Prevention Lifeline: 201-272-3608 or TTY: 623-206-2033 TTY 210-424-6239) to talk to a trained counselor if you are experiencing a Mental Health or Behavioral Health Crisis or need someone  to talk to.  Braxen Dobek J. Cristela Felt, RN, [name], MSN Care Management Coordinator/[place] Phone Number:  [phone]

## 2023-03-09 NOTE — Patient Outreach (Signed)
Care Management  Transitions of Care Program Transitions of Care Post-discharge week 2   03/09/2023 Name: Todd Mccoy MRN: [id number] DOB: Sep 20, 1970  Subjective: Todd Mccoy is a 52 y.o. year old male who is a primary care patient of [name], MD. The Care Management team Engaged with patient Engaged with patient by telephone to assess and address transitions of care needs.   Consent to Services:  Patient was given information about care management services, agreed to services, and gave verbal consent to participate.   Assessment:           SDOH Interventions    Flowsheet Row Telephone from 03/09/2023 in [place] POPULATION HEALTH DEPARTMENT Telephone from 03/02/2023 in [place] POPULATION HEALTH DEPARTMENT Patient Outreach Telephone from 11/03/2022 in [place] POPULATION HEALTH DEPARTMENT Patient Outreach Telephone from 10/15/2022 in [place] POPULATION HEALTH DEPARTMENT Patient Outreach Telephone from 07/14/2022 in [place] POPULATION HEALTH DEPARTMENT Patient Outreach Telephone from 06/14/2022 in [place] POPULATION HEALTH DEPARTMENT  SDOH Interventions        Food Insecurity Interventions -- -- Intervention Not Indicated -- Intervention Not Indicated --  Housing Interventions -- -- Intervention Not Indicated -- Intervention Not Indicated --  Transportation Interventions -- Intervention Not Indicated -- Intervention Not Indicated Intervention Not Indicated Intervention Not Indicated  Utilities Interventions Intervention Not Indicated -- -- -- Intervention Not Indicated --  Health Literacy Interventions Other (Comment)  [Son assists] -- -- -- -- --        Goals Addressed             This Visit's Progress    Transition of Care       Current Barriers:  Knowledge Deficits related to plan of care for management of CHF and CKD Stage IV   RNCM Clinical Goal(s):  Patient will verbalize understanding in management of the condition/disease  process, therapeutic needs, and potential complications of CHF/CKD IV through collaboration with RN Care manager, provider, and care team.   Interventions: Evaluation of current treatment plan related to  self management and patient's adherence to plan as established by provider.  Marland Kitchen Heart Failure Interventions:  (Status:  Goal on track:  Yes.) Long Term Goal Basic overview and discussion of pathophysiology of Heart Failure reviewed Provided education on low sodium diet Discussed importance of daily weight and advised patient to weigh and record daily Reviewed role of diuretics in prevention of fluid overload and management of heart failure;      Chronic Kidney Disease Interventions:  (Status:  Goal on track:  Yes.) Long Term Goal Assessed the Patient understanding of chronic kidney disease    Reviewed medications with patient and discussed importance of compliance    Assessed patient transportation resources to dialysis appointment. No identified barrier.   Last practice recorded BP readings:  BP Readings from Last 3 Encounters:  02/14/23 (!) 155/94  11/26/22 (!) 172/105  10/27/22 128/80   Most recent eGFR/CrCl:  Lab Results  Component Value Date   EGFR 12 (L) 10/05/2022    No components found for: "CRCL"    Diabetes Interventions:  (Status:  Goal on track:  Yes.) Long Term Goal Assessed patient's understanding of A1c goal: <7% Reviewed medications with patient and discussed importance of medication adherence Assess recent blood glucose levels- reports check blood glucose levels regularly - reports running in 130"s   Patient Goals/Self-Care Activities: Take medications as prescribed   Attend all scheduled provider appointments Call pharmacy for medication refills 3-7 days in advance of  running out of medications Call provider office for new concerns or questions  call office if I gain more than 2 pounds in one day or 5 pounds in one week use salt in moderation watch for  swelling in feet, ankles and legs every day weigh myself daily Attend all scheduled dialysis appointments. Call RNCM with any transportation needs to schedule provider/dialysis appointments.                        Plan: Telephone follow up appointment with care management team member scheduled for:  Todd Orndoff J. Cristela Felt, RN, [name], MSN Care Management Coordinator/[place] Phone Number:  [phone]

## 2023-03-10 ENCOUNTER — Ambulatory Visit: Payer: Medicare Other | Attending: Cardiology | Admitting: Cardiology

## 2023-03-11 ENCOUNTER — Encounter: Payer: Self-pay | Admitting: Cardiology

## 2023-03-17 ENCOUNTER — Inpatient Hospital Stay: Payer: Medicare Other | Admitting: Critical Care Medicine

## 2023-03-17 NOTE — Progress Notes (Deleted)
Established Patient Office Visit   and TOC OV  Subjective   Patient ID: Todd Mccoy, male    DOB: 1970-11-16  Age: 52 y.o. MRN: [id number]  No chief complaint on file.   52 y.o.PCP [name]   TOC  DC SUmm Admit date: 02/04/2023 Discharge date: 02/14/2023   Admitted From: Home Disposition:  LTAC   Recommendations for Outpatient Follow-up:  1. Follow up with PCP in 1-2 weeks 2. Continue dialysis as discussed   Discharge Condition:Stable  CODE STATUS:Full  Diet recommendation: Renal diabetic diet    Brief/Interim Summary: Patient 52 year old gentleman history of poorly controlled hypertension, diabetes, HF R EF, CKD stage IV, blindness, PVD status post left BKA presenting to the ED with 3 days of diarrhea, decreased urine output, decreased oral intake. Patient noted to be volume overloaded on examination and hypotensive. Patient noted to be in acute on chronic renal failure requiring CVVHD. Patient also required pressors for shock felt likely combination of hypovolemic and septic shock. Patient received a course of IV antibiotics for possible UTI. Nephrology consulted and following. Cardiology also consulted. Patient noted to have bouts of agitation and noted to be physically and verbally abusive to staff and subsequently placed on a Precedex drip. Precedex drip subsequently weaned off, patient transferred to the floor and to [facility] hospitalist service.    Patient tolerated first session of HD well 02/09/23 - renal function continues to be sub-optimal - tunneled HD cath placed 8/9 with plan for ongoing HD outpatient per nephrology. Medically stable for discharge - currently accepted to ltac facility for ongoing care - outpatient HD slot obtained ([facility]) on TTS 11:55 am).   Discharge Diagnoses:  Principal Problem:   Shock circulatory (HCC) Active Problems:   Encephalopathy acute   Diarrhea of presumed infectious origin   Metabolic acidosis   Anemia of chronic disease    Acute renal failure superimposed on chronic kidney disease (HCC)    DC to [facility]:      {History (Optional):23778}  ROS    Objective:     There were no vitals taken for this visit. {Vitals History (Optional):23777}  Physical Exam   No results found for any visits on 03/17/23.  {Labs (Optional):23779}  The ASCVD Risk score (Arnett DK, et al., 2019) failed to calculate for the following reasons:   The valid total cholesterol range is 130 to 320 mg/dL    Assessment & Plan:   Problem List Items Addressed This Visit   None   No follow-ups on file.    Shan Levans, MD

## 2023-03-18 ENCOUNTER — Telehealth: Payer: Self-pay

## 2023-03-18 ENCOUNTER — Other Ambulatory Visit: Payer: Medicare Other

## 2023-03-18 NOTE — Patient Outreach (Signed)
Care Management  Transitions of Care Program Managed Medicaid Transitions of Care week 3  03/18/2023 Name: Todd Mccoy MRN: [id number] DOB: 1971-06-11  Subjective: Todd Mccoy is a 52 y.o. year old male who is a primary care patient of [name], MD. The Care Management team was unable to reach the patient by phone to assess and address transitions of care needs.   Plan: Additional outreach attempts will be made to reach the patient enrolled in the [facility] Program (Post Inpatient/ED Visit).  Alyse Low, RN, BA, [facility], CRRN [name] Coordinator, Transition of Care Ph # [phone]

## 2023-03-21 ENCOUNTER — Other Ambulatory Visit: Payer: Medicare Other

## 2023-03-21 ENCOUNTER — Telehealth: Payer: Self-pay

## 2023-03-21 NOTE — Patient Outreach (Signed)
Care Management  Transitions of Care Program Managed Medicaid Transitions of Care unsuccessful follow up outreach #3  03/21/2023 Name: Todd Mccoy MRN: [id number] DOB: Dec 12, 1970  Subjective: Todd Mccoy is a 52 y.o. year old male who is a primary care patient of [name], MD. The Care Management team was unable to reach the patient by phone to assess and address transitions of care needs.   Plan: No further outreach attempts will be made at this time.  We have been unable to reach the patient.  Alyse Low, RN, BA, [facility], CRRN [facility] Coordinator, Transition of Care Ph # [phone]

## 2023-03-23 ENCOUNTER — Other Ambulatory Visit: Payer: Medicare Other

## 2023-03-24 ENCOUNTER — Other Ambulatory Visit: Payer: Self-pay | Admitting: Nurse Practitioner

## 2023-03-28 ENCOUNTER — Ambulatory Visit (HOSPITAL_BASED_OUTPATIENT_CLINIC_OR_DEPARTMENT_OTHER): Payer: Medicare Other | Admitting: General Surgery

## 2023-03-28 ENCOUNTER — Inpatient Hospital Stay: Payer: Medicare Other | Admitting: Family Medicine

## 2023-04-22 ENCOUNTER — Other Ambulatory Visit: Payer: Self-pay

## 2023-04-22 DIAGNOSIS — N1832 Chronic kidney disease, stage 3b: Secondary | ICD-10-CM

## 2023-04-25 ENCOUNTER — Telehealth: Payer: Self-pay

## 2023-04-25 NOTE — Telephone Encounter (Signed)
Copied from CRM [id number]. Topic: General - Other >> Apr 25, 2023  8:11 AM Phill Myron wrote: Home Health Verbal Orders - Caller/Agency:  Clydie Braun [facility] Callback Number: [phone] Requesting: Verbal orders for Continued Nursing Services for wound care and Hypertension Education   Frequency: 2w9

## 2023-04-27 ENCOUNTER — Encounter (HOSPITAL_BASED_OUTPATIENT_CLINIC_OR_DEPARTMENT_OTHER): Payer: Medicare Other | Attending: General Surgery | Admitting: General Surgery

## 2023-04-27 DIAGNOSIS — L97512 Non-pressure chronic ulcer of other part of right foot with fat layer exposed: Secondary | ICD-10-CM | POA: Insufficient documentation

## 2023-04-27 DIAGNOSIS — F17210 Nicotine dependence, cigarettes, uncomplicated: Secondary | ICD-10-CM | POA: Diagnosis not present

## 2023-04-27 DIAGNOSIS — E1122 Type 2 diabetes mellitus with diabetic chronic kidney disease: Secondary | ICD-10-CM | POA: Insufficient documentation

## 2023-04-27 DIAGNOSIS — I132 Hypertensive heart and chronic kidney disease with heart failure and with stage 5 chronic kidney disease, or end stage renal disease: Secondary | ICD-10-CM | POA: Diagnosis not present

## 2023-04-27 DIAGNOSIS — N186 End stage renal disease: Secondary | ICD-10-CM | POA: Diagnosis not present

## 2023-04-27 DIAGNOSIS — I5042 Chronic combined systolic (congestive) and diastolic (congestive) heart failure: Secondary | ICD-10-CM | POA: Diagnosis not present

## 2023-04-27 DIAGNOSIS — L97812 Non-pressure chronic ulcer of other part of right lower leg with fat layer exposed: Secondary | ICD-10-CM | POA: Diagnosis not present

## 2023-04-27 DIAGNOSIS — E11622 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 ulcer: Secondary | ICD-10-CM | POA: Insufficient documentation

## 2023-04-27 DIAGNOSIS — E11621 Type 2 diabetes mellitus with foot ulcer: Secondary | ICD-10-CM | POA: Diagnosis not present

## 2023-04-27 DIAGNOSIS — Z992 Dependence on renal dialysis: Secondary | ICD-10-CM | POA: Insufficient documentation

## 2023-04-27 NOTE — Progress Notes (Signed)
S. 04/27/2023 12:45 PM Medical Record Number: [id number] Patient Account Number: [id number] Date of Birth/Sex: Treating RN: 1971-02-15 (52 y.o. Marlan Palau Primary Care Provider: [name] Other Clinician: Referring Provider: Treating Provider/Extender: Kern Reap in Treatment: 0 The following information was scribed by: Samuella Bruin The information was scribed for: Duanne Guess Verbal / Phone Orders: No Diagnosis Coding ICD-10 Coding Code Description L97.512 Non-pressure chronic ulcer of other part of right foot with fat layer exposed L97.812 Non-pressure chronic ulcer of other part of right lower leg with fat layer exposed E11.622 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  ulcer E11.621 Type 2 diabetes mellitus with foot ulcer I50.42 Chronic combined systolic (congestive) and diastolic (congestive) heart failure N18.6 End stage renal disease Follow-up Appointments ppointment in 1 week. - Dr. Lady Gary - room 2 Return A Anesthetic (In clinic) Topical Lidocaine 4% applied to wound bed Bathing/ Shower/ Hygiene May shower and wash wound with soap and water. - when changing dressing Edema Control - Lymphedema / SCD / Other Elevate legs to the level of the heart or above for 30 minutes daily and/or when sitting for 3-4 times a day throughout the day. Avoid standing for long periods of time. Wound Treatment Wound #10 - Lower Leg Wound Laterality: Right, Anterior Cleanser: Soap and Water 1 x Per Week/30 Days Discharge Instructions: May shower and wash wound with dial antibacterial soap and water prior to dressing change. Cleanser: Wound Cleanser 1 x Per Week/30 Days Discharge Instructions: Cleanse the wound with wound cleanser prior to applying a clean dressing using gauze sponges, not tissue or cotton balls. Peri-Wound Care: Sween Lotion (Moisturizing lotion) 1 x Per Week/30 Days Discharge Instructions: Apply moisturizing lotion as directed Prim Dressing: Maxorb Extra Ag+ Alginate Dressing, 2x2 (in/in) 1 x Per Week/30 Days ary Discharge Instructions: Apply to wound bed as instructed Secondary Dressing: Woven Gauze Sponge, Non-Sterile 4x4 in 1 x Per Week/30 Days Discharge Instructions: Apply over primary dressing as directed. Secured With: 81M Medipore H Soft Cloth Surgical T ape, 4 x 10 (in/yd) 1 x Per Week/30 Days Discharge Instructions: Secure with tape as directed. Compression Wrap: Kerlix Roll 4.5x3.1 (in/yd) 1 x Per Week/30 Days Discharge Instructions: Apply Kerlix and Coban compression as directed. Compression Wrap: Coban Self-Adherent Wrap 4x5 (in/yd) 1 x Per Week/30 Days Discharge Instructions: Apply over Kerlix as directed. Wound #11 - Lower Leg Wound  Laterality: Right, Anterior, Proximal Cleanser: Soap and Water 1 x Per Week/30 Days Discharge Instructions: May shower and wash wound with dial antibacterial soap and water prior to dressing change. Cleanser: Wound Cleanser 1 x Per Week/30 Days Discharge Instructions: Cleanse the wound with wound cleanser prior to applying a clean dressing using gauze sponges, not tissue or cotton balls. Peri-Wound Care: Sween Lotion (Moisturizing lotion) 1 x Per Week/30 Days Discharge Instructions: Apply moisturizing lotion as directed Prim Dressing: Maxorb Extra Ag+ Alginate Dressing, 2x2 (in/in) 1 x Per Week/30 Days ary Discharge Instructions: Apply to wound bed as instructed CRUIZ, IMPERIAL ([id number]) [id number] Page 6 of 13 Secondary Dressing: Woven Gauze Sponge, Non-Sterile 4x4 in 1 x Per Week/30 Days Discharge Instructions: Apply over primary dressing as directed. Secured With: 81M Medipore H Soft Cloth Surgical T ape, 4 x 10 (in/yd) 1 x Per Week/30 Days Discharge Instructions: Secure with tape as directed. Compression Wrap: Kerlix Roll 4.5x3.1 (in/yd) 1 x Per Week/30 Days Discharge Instructions: Apply Kerlix and Coban compression as directed. 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 Negative for: Gout; Rheumatoid Arthritis; Osteoarthritis Neurologic Medical History: Positive for: Neuropathy Negative for: Dementia; Quadriplegia; Paraplegia; Seizure Disorder Oncologic Medical History: Negative for: Received Chemotherapy; Received Radiation Immunizations Pneumococcal Vaccine: Received Pneumococcal Vaccination: No Implantable Devices None Hospitalization / Surgery History Type of Hospitalization/Surgery IandD abscess right flank scrotum exploration Left BKA 5/22 Family and Social History Cancer: No; Diabetes: Yes - Mother; Heart Disease: No; Hereditary Spherocytosis: No; Hypertension: Yes - Mother; Kidney Disease: No; Lung Disease: No; Seizures: No; Stroke: No; Thyroid Problems: No; Tuberculosis: No; Current every day smoker - 1/2 ppd; Marital Status - Single; Alcohol Use: Never; Drug Use: No History; Caffeine Use: Daily - soda, tea; Financial Concerns: No; Food, Clothing or Shelter Needs: No; Support System Lacking: No; Transportation Concerns: No Electronic  Signature(s) Signed: 04/27/2023 4:15:04 PM By: Samuella Bruin Signed: 04/27/2023 4:38:34 PM By: Duanne Guess MD FACS Entered By: Samuella Bruin on 04/27/2023 09:53:18 GAYLOR, RAWLINGS ([id number]) [id number] Page 13 of 13 -------------------------------------------------------------------------------- SuperBill Details Patient Name: Date of Service: MO [name] NA THA N [name] 04/27/2023 Medical Record Number: [id number] Patient Account Number: [id number] Date of Birth/Sex: Treating RN: 04/11/1971 (52 y.o. 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.812 Non-pressure chronic ulcer of other part of right lower leg with fat layer exposed E11.622 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 ulcer E11.621 Type 2 diabetes mellitus with foot ulcer I50.42 Chronic combined systolic (congestive) and diastolic (congestive) heart failure N18.6 End stage renal disease Facility Procedures : CPT4 Code: 21308657 Description: 97597 - DEBRIDE WOUND 1ST 20 SQ CM OR < ICD-10 Diagnosis Description L97.512 Non-pressure chronic ulcer of other part of right foot with fat layer exposed L97.812 Non-pressure chronic ulcer of other part of right lower leg with fat layer  expos Modifier: ed Quantity: 1 Physician Procedures : CPT4 Code Description Modifier [id number] 99214 - WC PHYS LEVEL 4 - EST PT ICD-10 Diagnosis Description L97.512 Non-pressure chronic ulcer of other part of right foot with fat layer exposed L97.812 Non-pressure chronic ulcer of other part of right lower  leg with fat layer exposed E11.622 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 ulcer E11.621 Type 2 diabetes mellitus with foot ulcer Quantity: 1 : [id number]  97597 - WC PHYS DEBR WO ANESTH 20 SQ CM ICD-10 Diagnosis Description L97.512 Non-pressure chronic ulcer of other part of right foot with fat layer exposed L97.812 Non-pressure chronic ulcer of other part of right lower leg with fat layer  exposed Quantity: 1 Electronic Signature(s) Signed: 04/27/2023 2:36:24 PM By: Duanne Guess MD FACS Entered By: Duanne Guess on 04/27/2023 11:36:24  10:29:03 Debridement Details -------------------------------------------------------------------------------- NICOLUS, METCALFE ([id number]) [id number] Page 3 of 13 Patient Name: Date of Service: MO [name] NA THA Vaughan Sine 04/27/2023 12:45 PM Medical Record Number: [id number] Patient Account Number: [id number] Date of Birth/Sex: Treating RN: 06-28-1971 (52 y.o. Marlan Palau Primary Care Provider: [name] Other Clinician: Referring Provider: Treating Provider/Extender: Kern Reap in Treatment: 0 Debridement Performed for Assessment: Wound #9 Right T Fifth oe Performed By: Physician Duanne Guess, MD The following information was scribed by: Samuella Bruin The information was scribed for: Duanne Guess Debridement Type: Debridement Severity of Tissue Pre Debridement: Fat layer exposed Level of Consciousness (Pre-procedure): Awake and Alert Pre-procedure Verification/Time Out Yes - 13:26 Taken: Start Time: 13:26 Pain Control: Lidocaine 4% Topical Solution Percent of Wound Bed Debrided: 100% T Area Debrided (cm): otal 6.28 Tissue and other  material debrided: Non-Viable, Callus, Slough, Skin: Epidermis, Slough Level: Skin/Epidermis Debridement Description: Selective/Open Wound Instrument: Curette, Forceps, Scissors Bleeding: Minimum Hemostasis Achieved: Pressure Response to Treatment: Procedure was tolerated well Level of Consciousness (Post- Awake and Alert procedure): Post Debridement Measurements of Total Wound Length: (cm) 4 Width: (cm) 2 Depth: (cm) 0.1 Volume: (cm) 0.628 Character of Wound/Ulcer Post Debridement: Improved Severity of Tissue Post Debridement: Fat layer exposed Post Procedure Diagnosis Same as Pre-procedure Electronic Signature(s) Signed: 04/27/2023 4:15:04 PM By: Samuella Bruin Signed: 04/27/2023 4:38:34 PM By: Duanne Guess MD FACS Entered By: Samuella Bruin on 04/27/2023 10:31:44 -------------------------------------------------------------------------------- HPI Details Patient Name: Date of Service: [name] 04/27/2023 12:45 PM Medical Record Number: [id number] Patient Account Number: [id number] Date of Birth/Sex: Treating RN: 1970/12/14 (52 y.o. M) Primary Care Provider: [name] Other Clinician: Referring Provider: Treating Provider/Extender: Kern Reap in Treatment: 0 History of Present Illness HPI Description: ADMISSION 11/20/2019; this is a 52 year old man with poorly controlled type 2 diabetes. Roughly 3 to 4 weeks ago he noted swelling of his leg open wounds with draining sores. This happened fairly suddenly. He was seen in the ER on 11/07/2019 noted to have wounds of the right lower extremity x2 weeks at that point. An x-ray of the area was negative one of the wounds was cultured showing MSSA and group B strep. He was noted to have multiple wounds. He was given a course of doxycycline for 7 days white count was 8.4 hemoglobin 9.7. Since then he has been washing the leg and applying dry gauze. He is here in the clinic for  review of this. 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 At that point his culture was MRSA. No no 11/27/19-Patient returns after starting in the clinic for open leg wounds on his right and his right second toe. We have been applying silver alginate and 3 layer DUFFY, BESANCON ([id number]) [id number] Page 4 of 13 compression on the right. Patient completed course of doxycycline. The wounds are looking slightly smaller, the right posterior wound appears to have healed 6/8; patient is here in follow-up for open wounds on his right leg and his right second toe. X-ray I did last time was strangely negative. I gave him a course of Keflex which she is completed the area on the tip of the second toe is actually close over. All of the wounds on his right anterior lateral and posterior leg are considerably better. There is  VATSAL, ORAVEC ([id number]) [id number] Page 1 of 13 Visit Report for 04/27/2023 Chief Complaint Document Details Patient Name: Date of Service: MO [name] NA THA N [name] 04/27/2023 12:45 PM Medical Record Number: [id number] Patient Account Number: [id number] Date of Birth/Sex: Treating RN: [birth year] (52 y.o. M) Primary Care Provider: [name] Other Clinician: Referring Provider: Treating Provider/Extender: Kern Reap in Treatment: 0 Information Obtained from: Patient Chief Complaint 11/20/2019; patient is here with for review of multiple wounds over the right lower leg, right great and right second toe 09/08/2021; patient presents for wound to the right lateral foot 04/27/2023: wound on right lateral foot (5th metatarsal head level) and multiple RLE wounds Electronic Signature(s) Signed: 04/27/2023 2:17:15 PM By: Duanne Guess MD FACS Entered By: Duanne Guess on 04/27/2023 11:17:15 -------------------------------------------------------------------------------- Debridement Details Patient Name: Date of Service: [name] 04/27/2023 12:45 PM Medical Record Number: [id number] Patient Account Number: [id number] Date of Birth/Sex: Treating RN: 03/22/1971 (52 y.o. Marlan Palau Primary Care Provider: [name] Other Clinician: Referring Provider: Treating Provider/Extender: Kern Reap in Treatment: 0 Debridement Performed for Assessment: Wound #10 Right,Anterior Lower Leg Performed By: Physician Duanne Guess, MD The following information was scribed by: Samuella Bruin The information was scribed for: Duanne Guess Debridement Type: Debridement Severity of Tissue Pre Debridement: Fat layer exposed Level of Consciousness (Pre-procedure): Awake and Alert Pre-procedure Verification/Time Out Yes - 13:26 Taken: Start Time: 13:26 Pain Control:  Lidocaine 4% Topical Solution Percent of Wound Bed Debrided: 100% T Area Debrided (cm): otal 0.99 Tissue and other material debrided: Non-Viable, Eschar, Slough, Slough Level: Non-Viable Tissue Debridement Description: Selective/Open Wound Instrument: Curette Bleeding: Minimum Hemostasis Achieved: Pressure Response to Treatment: Procedure was tolerated well Level of Consciousness (Post- Awake and Alert procedure): Post Debridement Measurements of Total Wound Length: (cm) 1.4 Width: (cm) 0.9 Depth: (cm) 0.1 Volume: (cm) 0.099 TRAYLIN, FILBERT ([id number]) [id number] Page 2 of 13 Character of Wound/Ulcer Post Debridement: Improved Severity of Tissue Post Debridement: Fat layer exposed Post Procedure Diagnosis Same as Pre-procedure Electronic Signature(s) Signed: 04/27/2023 4:15:04 PM By: Samuella Bruin Signed: 04/27/2023 4:38:34 PM By: Duanne Guess MD FACS Entered By: Samuella Bruin on 04/27/2023 10:27:11 -------------------------------------------------------------------------------- Debridement Details Patient Name: Date of Service: [name] 04/27/2023 12:45 PM Medical Record Number: [id number] Patient Account Number: [id number] Date of Birth/Sex: Treating RN: September 20, 1970 (52 y.o. Marlan Palau Primary Care Provider: [name] Other Clinician: Referring Provider: Treating Provider/Extender: Kern Reap in Treatment: 0 Debridement Performed for Assessment: Wound #11 Right,Proximal,Anterior Lower Leg Performed By: Physician Duanne Guess, MD The following information was scribed by: Samuella Bruin The information was scribed for: Duanne Guess Debridement Type: Debridement Level of Consciousness (Pre-procedure): Awake and Alert Pre-procedure Verification/Time Out Yes - 13:26 Taken: Start Time: 13:26 Pain Control: Lidocaine 4% Topical Solution Percent of Wound Bed  Debrided: 100% T Area Debrided (cm): otal 0.08 Tissue and other material debrided: Non-Viable, Eschar, Slough, Slough Level: Non-Viable Tissue Debridement Description: Selective/Open Wound Instrument: Curette Bleeding: Minimum Hemostasis Achieved: Pressure Response to Treatment: Procedure was tolerated well Level of Consciousness (Post- Awake and Alert procedure): Post Debridement Measurements of Total Wound Length: (cm) 0.5 Width: (cm) 0.2 Depth: (cm) 0.1 Volume: (cm) 0.008 Character of Wound/Ulcer Post Debridement: Improved Post Procedure Diagnosis Same as Pre-procedure Electronic Signature(s) Signed: 04/27/2023 4:15:04 PM By: Samuella Bruin Signed: 04/27/2023 4:38:34 PM By: Duanne Guess MD FACS Entered By: Samuella Bruin on 04/27/2023  for: Type I Diabetes Time with diabetes: 15 yrs Treated with: Insulin Blood sugar tested every day: Yes Tested : daily Blood sugar testing results: Breakfast: 170 Genitourinary Medical History: Positive for: 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 Past Medical History Notes: on dialysis Immunological Medical History: Negative for: Lupus Erythematosus; Raynauds; Scleroderma Musculoskeletal Medical History: Positive for: 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 Negative for: Gout; Rheumatoid Arthritis; Osteoarthritis Neurologic Medical History: Positive for: Neuropathy Negative for: Dementia; Quadriplegia; Paraplegia; Seizure Disorder Oncologic Medical History: Negative for: Received Chemotherapy; Received Radiation Immunizations Pneumococcal Vaccine: Received Pneumococcal Vaccination: No Implantable Devices None Hospitalization / Surgery History Type of Hospitalization/Surgery IandD abscess right flank scrotum exploration Left BKA 5/22 Family and Social History Cancer: No; Diabetes: Yes - Mother; Heart Disease: No; Hereditary Spherocytosis: No; Hypertension: Yes - Mother; Kidney Disease: No; Lung Disease: No; Seizures: No; Stroke: No; Thyroid Problems: No; Tuberculosis: No; Current every day smoker - 1/2 ppd; Marital Status - Single; Alcohol Use: Never; Drug Use: No History; Caffeine Use: Daily - soda, tea; Financial Concerns: No; Food, Clothing or Shelter Needs: No; Support System Lacking: No; Transportation Concerns: No Electronic  Signature(s) Signed: 04/27/2023 4:15:04 PM By: Samuella Bruin Signed: 04/27/2023 4:38:34 PM By: Duanne Guess MD FACS Entered By: Samuella Bruin on 04/27/2023 09:53:18 GAYLOR, RAWLINGS ([id number]) [id number] Page 13 of 13 -------------------------------------------------------------------------------- SuperBill Details Patient Name: Date of Service: MO [name] NA THA N [name] 04/27/2023 Medical Record Number: [id number] Patient Account Number: [id number] Date of Birth/Sex: Treating RN: 04/11/1971 (52 y.o. 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.812 Non-pressure chronic ulcer of other part of right lower leg with fat layer exposed E11.622 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 ulcer E11.621 Type 2 diabetes mellitus with foot ulcer I50.42 Chronic combined systolic (congestive) and diastolic (congestive) heart failure N18.6 End stage renal disease Facility Procedures : CPT4 Code: 21308657 Description: 97597 - DEBRIDE WOUND 1ST 20 SQ CM OR < ICD-10 Diagnosis Description L97.512 Non-pressure chronic ulcer of other part of right foot with fat layer exposed L97.812 Non-pressure chronic ulcer of other part of right lower leg with fat layer  expos Modifier: ed Quantity: 1 Physician Procedures : CPT4 Code Description Modifier [id number] 99214 - WC PHYS LEVEL 4 - EST PT ICD-10 Diagnosis Description L97.512 Non-pressure chronic ulcer of other part of right foot with fat layer exposed L97.812 Non-pressure chronic ulcer of other part of right lower  leg with fat layer exposed E11.622 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 ulcer E11.621 Type 2 diabetes mellitus with foot ulcer Quantity: 1 : [id number]  97597 - WC PHYS DEBR WO ANESTH 20 SQ CM ICD-10 Diagnosis Description L97.512 Non-pressure chronic ulcer of other part of right foot with fat layer exposed L97.812 Non-pressure chronic ulcer of other part of right lower leg with fat layer  exposed Quantity: 1 Electronic Signature(s) Signed: 04/27/2023 2:36:24 PM By: Duanne Guess MD FACS Entered By: Duanne Guess on 04/27/2023 11:36:24  directed. Com pression Wrap: Coban Self-Adherent Wrap  4x5 (in/yd) 1 x Per Week/30 Days Discharge Instructions: Apply over Kerlix as directed. WOUND #9: - T Fifth Wound Laterality: Right oe Cleanser: Soap and Water 1 x Per Day/30 Days Discharge Instructions: May shower and wash wound with dial antibacterial soap and water prior to dressing change. Cleanser: Wound Cleanser 1 x Per Day/30 Days Discharge Instructions: Cleanse the wound with wound cleanser prior to applying a clean dressing using gauze sponges, not tissue or cotton balls. Peri-Wound Care: Sween Lotion (Moisturizing lotion) 1 x Per Day/30 Days Discharge Instructions: Apply moisturizing lotion as directed Prim Dressing: Maxorb Extra Ag+ Alginate Dressing, 2x2 (in/in) 1 x Per Day/30 Days ary Discharge Instructions: Apply to wound bed as instructed Secondary Dressing: Woven Gauze Sponge, Non-Sterile 4x4 in 1 x Per Day/30 Days Discharge Instructions: Apply over primary dressing as directed. Secured With: 19M Medipore H Soft Cloth Surgical T ape, 4 x 10 (in/yd) 1 x Per Day/30 Days Discharge Instructions: Secure with tape as directed. 04/27/2023: On the lateral aspect of his right fifth toe and metatarsal head, there is an open ulcer that shows signs of recent healing around the edges. There is a bit of leathery eschar cleaning to the surface with slough underneath. The granulation tissue under the slough is robust and appears healthy. On his anterior tibial surface, there are multiple recently healed wound sites with 2 that remain open underneath a layer of eschar. These do penetrate into the fat layer. I used a curette to debride callus, slough, and skin from the right lateral foot ulcer. I used scissors and forceps to debride the eschar. I used a curette to debride eschar and slough from both of the open anterior tibial wounds. We will apply silver alginate to all sites, with Kerlix and Coban wrap. Follow-up in 1 week. MYLIN, STOLAR ([id number])  [id number] Page 11 of 13 Electronic Signature(s) Signed: 04/27/2023 2:35:48 PM By: Duanne Guess MD FACS Entered By: Duanne Guess on 04/27/2023 11:35:47 -------------------------------------------------------------------------------- HxROS Details Patient Name: Date of Service: [name] 04/27/2023 12:45 PM Medical Record Number: [id number] Patient Account Number: [id number] Date of Birth/Sex: Treating RN: 1970-11-21 (52 y.o. Marlan Palau Primary Care Provider: [name] Other Clinician: Referring Provider: Treating Provider/Extender: Kern Reap in Treatment: 0 Information Obtained From Patient Chart Ear/Nose/Mouth/Throat Complaints and Symptoms: Negative for: Chronic sinus problems or rhinitis Medical History: Negative for: Chronic sinus problems/congestion; Middle ear problems Respiratory Complaints and Symptoms: Negative for: Chronic or frequent coughs; Shortness of Breath Gastrointestinal Complaints and Symptoms: Negative for: Frequent diarrhea; Nausea; Vomiting Medical History: Negative for: Cirrhosis ; Colitis; Crohns; Hepatitis A; Hepatitis B; Hepatitis C Integumentary (Skin) Complaints and Symptoms: Positive for: Wounds Medical History: Negative for: History of Burn Psychiatric Complaints and Symptoms: Negative for: Claustrophobia Medical History: Negative for: Anorexia/bulimia; Confinement Anxiety Constitutional Symptoms (General Health) Medical History: Past Medical History Notes: morbid obesity Eyes Medical History: Negative for: Cataracts; Glaucoma; Optic Neuritis Past Medical History Notes: retina detachment surgery- 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 Hematologic/Lymphatic MANSUR, SANDUSKY ([id number]) [id number].pdf Page 12 of 13 Medical History: Positive for: Anemia Negative for:  Hemophilia; Human Immunodeficiency Virus; Lymphedema; Sickle Cell Disease Cardiovascular Medical History: Positive for: Congestive Heart Failure - EF 40-45%; Hypertension; Peripheral Arterial Disease Negative for: Angina; Arrhythmia; Coronary Artery Disease; Deep Vein Thrombosis; Hypotension; Myocardial Infarction; Peripheral Venous Disease; Phlebitis; Vasculitis Endocrine Medical History: Positive for: Type II Diabetes Negative  VATSAL, ORAVEC ([id number]) [id number] Page 1 of 13 Visit Report for 04/27/2023 Chief Complaint Document Details Patient Name: Date of Service: MO [name] NA THA N [name] 04/27/2023 12:45 PM Medical Record Number: [id number] Patient Account Number: [id number] Date of Birth/Sex: Treating RN: [birth year] (52 y.o. M) Primary Care Provider: [name] Other Clinician: Referring Provider: Treating Provider/Extender: Kern Reap in Treatment: 0 Information Obtained from: Patient Chief Complaint 11/20/2019; patient is here with for review of multiple wounds over the right lower leg, right great and right second toe 09/08/2021; patient presents for wound to the right lateral foot 04/27/2023: wound on right lateral foot (5th metatarsal head level) and multiple RLE wounds Electronic Signature(s) Signed: 04/27/2023 2:17:15 PM By: Duanne Guess MD FACS Entered By: Duanne Guess on 04/27/2023 11:17:15 -------------------------------------------------------------------------------- Debridement Details Patient Name: Date of Service: [name] 04/27/2023 12:45 PM Medical Record Number: [id number] Patient Account Number: [id number] Date of Birth/Sex: Treating RN: 03/22/1971 (52 y.o. Marlan Palau Primary Care Provider: [name] Other Clinician: Referring Provider: Treating Provider/Extender: Kern Reap in Treatment: 0 Debridement Performed for Assessment: Wound #10 Right,Anterior Lower Leg Performed By: Physician Duanne Guess, MD The following information was scribed by: Samuella Bruin The information was scribed for: Duanne Guess Debridement Type: Debridement Severity of Tissue Pre Debridement: Fat layer exposed Level of Consciousness (Pre-procedure): Awake and Alert Pre-procedure Verification/Time Out Yes - 13:26 Taken: Start Time: 13:26 Pain Control:  Lidocaine 4% Topical Solution Percent of Wound Bed Debrided: 100% T Area Debrided (cm): otal 0.99 Tissue and other material debrided: Non-Viable, Eschar, Slough, Slough Level: Non-Viable Tissue Debridement Description: Selective/Open Wound Instrument: Curette Bleeding: Minimum Hemostasis Achieved: Pressure Response to Treatment: Procedure was tolerated well Level of Consciousness (Post- Awake and Alert procedure): Post Debridement Measurements of Total Wound Length: (cm) 1.4 Width: (cm) 0.9 Depth: (cm) 0.1 Volume: (cm) 0.099 TRAYLIN, FILBERT ([id number]) [id number] Page 2 of 13 Character of Wound/Ulcer Post Debridement: Improved Severity of Tissue Post Debridement: Fat layer exposed Post Procedure Diagnosis Same as Pre-procedure Electronic Signature(s) Signed: 04/27/2023 4:15:04 PM By: Samuella Bruin Signed: 04/27/2023 4:38:34 PM By: Duanne Guess MD FACS Entered By: Samuella Bruin on 04/27/2023 10:27:11 -------------------------------------------------------------------------------- Debridement Details Patient Name: Date of Service: [name] 04/27/2023 12:45 PM Medical Record Number: [id number] Patient Account Number: [id number] Date of Birth/Sex: Treating RN: September 20, 1970 (52 y.o. Marlan Palau Primary Care Provider: [name] Other Clinician: Referring Provider: Treating Provider/Extender: Kern Reap in Treatment: 0 Debridement Performed for Assessment: Wound #11 Right,Proximal,Anterior Lower Leg Performed By: Physician Duanne Guess, MD The following information was scribed by: Samuella Bruin The information was scribed for: Duanne Guess Debridement Type: Debridement Level of Consciousness (Pre-procedure): Awake and Alert Pre-procedure Verification/Time Out Yes - 13:26 Taken: Start Time: 13:26 Pain Control: Lidocaine 4% Topical Solution Percent of Wound Bed  Debrided: 100% T Area Debrided (cm): otal 0.08 Tissue and other material debrided: Non-Viable, Eschar, Slough, Slough Level: Non-Viable Tissue Debridement Description: Selective/Open Wound Instrument: Curette Bleeding: Minimum Hemostasis Achieved: Pressure Response to Treatment: Procedure was tolerated well Level of Consciousness (Post- Awake and Alert procedure): Post Debridement Measurements of Total Wound Length: (cm) 0.5 Width: (cm) 0.2 Depth: (cm) 0.1 Volume: (cm) 0.008 Character of Wound/Ulcer Post Debridement: Improved Post Procedure Diagnosis Same as Pre-procedure Electronic Signature(s) Signed: 04/27/2023 4:15:04 PM By: Samuella Bruin Signed: 04/27/2023 4:38:34 PM By: Duanne Guess MD FACS Entered By: Samuella Bruin on 04/27/2023  directed. Com pression Wrap: Coban Self-Adherent Wrap  4x5 (in/yd) 1 x Per Week/30 Days Discharge Instructions: Apply over Kerlix as directed. WOUND #9: - T Fifth Wound Laterality: Right oe Cleanser: Soap and Water 1 x Per Day/30 Days Discharge Instructions: May shower and wash wound with dial antibacterial soap and water prior to dressing change. Cleanser: Wound Cleanser 1 x Per Day/30 Days Discharge Instructions: Cleanse the wound with wound cleanser prior to applying a clean dressing using gauze sponges, not tissue or cotton balls. Peri-Wound Care: Sween Lotion (Moisturizing lotion) 1 x Per Day/30 Days Discharge Instructions: Apply moisturizing lotion as directed Prim Dressing: Maxorb Extra Ag+ Alginate Dressing, 2x2 (in/in) 1 x Per Day/30 Days ary Discharge Instructions: Apply to wound bed as instructed Secondary Dressing: Woven Gauze Sponge, Non-Sterile 4x4 in 1 x Per Day/30 Days Discharge Instructions: Apply over primary dressing as directed. Secured With: 19M Medipore H Soft Cloth Surgical T ape, 4 x 10 (in/yd) 1 x Per Day/30 Days Discharge Instructions: Secure with tape as directed. 04/27/2023: On the lateral aspect of his right fifth toe and metatarsal head, there is an open ulcer that shows signs of recent healing around the edges. There is a bit of leathery eschar cleaning to the surface with slough underneath. The granulation tissue under the slough is robust and appears healthy. On his anterior tibial surface, there are multiple recently healed wound sites with 2 that remain open underneath a layer of eschar. These do penetrate into the fat layer. I used a curette to debride callus, slough, and skin from the right lateral foot ulcer. I used scissors and forceps to debride the eschar. I used a curette to debride eschar and slough from both of the open anterior tibial wounds. We will apply silver alginate to all sites, with Kerlix and Coban wrap. Follow-up in 1 week. MYLIN, STOLAR ([id number])  [id number] Page 11 of 13 Electronic Signature(s) Signed: 04/27/2023 2:35:48 PM By: Duanne Guess MD FACS Entered By: Duanne Guess on 04/27/2023 11:35:47 -------------------------------------------------------------------------------- HxROS Details Patient Name: Date of Service: [name] 04/27/2023 12:45 PM Medical Record Number: [id number] Patient Account Number: [id number] Date of Birth/Sex: Treating RN: 1970-11-21 (52 y.o. Marlan Palau Primary Care Provider: [name] Other Clinician: Referring Provider: Treating Provider/Extender: Kern Reap in Treatment: 0 Information Obtained From Patient Chart Ear/Nose/Mouth/Throat Complaints and Symptoms: Negative for: Chronic sinus problems or rhinitis Medical History: Negative for: Chronic sinus problems/congestion; Middle ear problems Respiratory Complaints and Symptoms: Negative for: Chronic or frequent coughs; Shortness of Breath Gastrointestinal Complaints and Symptoms: Negative for: Frequent diarrhea; Nausea; Vomiting Medical History: Negative for: Cirrhosis ; Colitis; Crohns; Hepatitis A; Hepatitis B; Hepatitis C Integumentary (Skin) Complaints and Symptoms: Positive for: Wounds Medical History: Negative for: History of Burn Psychiatric Complaints and Symptoms: Negative for: Claustrophobia Medical History: Negative for: Anorexia/bulimia; Confinement Anxiety Constitutional Symptoms (General Health) Medical History: Past Medical History Notes: morbid obesity Eyes Medical History: Negative for: Cataracts; Glaucoma; Optic Neuritis Past Medical History Notes: retina detachment surgery- 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 Hematologic/Lymphatic MANSUR, SANDUSKY ([id number]) [id number].pdf Page 12 of 13 Medical History: Positive for: Anemia Negative for:  Hemophilia; Human Immunodeficiency Virus; Lymphedema; Sickle Cell Disease Cardiovascular Medical History: Positive for: Congestive Heart Failure - EF 40-45%; Hypertension; Peripheral Arterial Disease Negative for: Angina; Arrhythmia; Coronary Artery Disease; Deep Vein Thrombosis; Hypotension; Myocardial Infarction; Peripheral Venous Disease; Phlebitis; Vasculitis Endocrine Medical History: Positive for: Type II Diabetes Negative  10:29:03 Debridement Details -------------------------------------------------------------------------------- NICOLUS, METCALFE ([id number]) [id number] Page 3 of 13 Patient Name: Date of Service: MO [name] NA THA Vaughan Sine 04/27/2023 12:45 PM Medical Record Number: [id number] Patient Account Number: [id number] Date of Birth/Sex: Treating RN: 06-28-1971 (52 y.o. Marlan Palau Primary Care Provider: [name] Other Clinician: Referring Provider: Treating Provider/Extender: Kern Reap in Treatment: 0 Debridement Performed for Assessment: Wound #9 Right T Fifth oe Performed By: Physician Duanne Guess, MD The following information was scribed by: Samuella Bruin The information was scribed for: Duanne Guess Debridement Type: Debridement Severity of Tissue Pre Debridement: Fat layer exposed Level of Consciousness (Pre-procedure): Awake and Alert Pre-procedure Verification/Time Out Yes - 13:26 Taken: Start Time: 13:26 Pain Control: Lidocaine 4% Topical Solution Percent of Wound Bed Debrided: 100% T Area Debrided (cm): otal 6.28 Tissue and other  material debrided: Non-Viable, Callus, Slough, Skin: Epidermis, Slough Level: Skin/Epidermis Debridement Description: Selective/Open Wound Instrument: Curette, Forceps, Scissors Bleeding: Minimum Hemostasis Achieved: Pressure Response to Treatment: Procedure was tolerated well Level of Consciousness (Post- Awake and Alert procedure): Post Debridement Measurements of Total Wound Length: (cm) 4 Width: (cm) 2 Depth: (cm) 0.1 Volume: (cm) 0.628 Character of Wound/Ulcer Post Debridement: Improved Severity of Tissue Post Debridement: Fat layer exposed Post Procedure Diagnosis Same as Pre-procedure Electronic Signature(s) Signed: 04/27/2023 4:15:04 PM By: Samuella Bruin Signed: 04/27/2023 4:38:34 PM By: Duanne Guess MD FACS Entered By: Samuella Bruin on 04/27/2023 10:31:44 -------------------------------------------------------------------------------- HPI Details Patient Name: Date of Service: [name] 04/27/2023 12:45 PM Medical Record Number: [id number] Patient Account Number: [id number] Date of Birth/Sex: Treating RN: 1970/12/14 (52 y.o. M) Primary Care Provider: [name] Other Clinician: Referring Provider: Treating Provider/Extender: Kern Reap in Treatment: 0 History of Present Illness HPI Description: ADMISSION 11/20/2019; this is a 52 year old man with poorly controlled type 2 diabetes. Roughly 3 to 4 weeks ago he noted swelling of his leg open wounds with draining sores. This happened fairly suddenly. He was seen in the ER on 11/07/2019 noted to have wounds of the right lower extremity x2 weeks at that point. An x-ray of the area was negative one of the wounds was cultured showing MSSA and group B strep. He was noted to have multiple wounds. He was given a course of doxycycline for 7 days white count was 8.4 hemoglobin 9.7. Since then he has been washing the leg and applying dry gauze. He is here in the clinic for  review of this. 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 At that point his culture was MRSA. No no 11/27/19-Patient returns after starting in the clinic for open leg wounds on his right and his right second toe. We have been applying silver alginate and 3 layer DUFFY, BESANCON ([id number]) [id number] Page 4 of 13 compression on the right. Patient completed course of doxycycline. The wounds are looking slightly smaller, the right posterior wound appears to have healed 6/8; patient is here in follow-up for open wounds on his right leg and his right second toe. X-ray I did last time was strangely negative. I gave him a course of Keflex which she is completed the area on the tip of the second toe is actually close over. All of the wounds on his right anterior lateral and posterior leg are considerably better. There is  VATSAL, ORAVEC ([id number]) [id number] Page 1 of 13 Visit Report for 04/27/2023 Chief Complaint Document Details Patient Name: Date of Service: MO [name] NA THA N [name] 04/27/2023 12:45 PM Medical Record Number: [id number] Patient Account Number: [id number] Date of Birth/Sex: Treating RN: [birth year] (52 y.o. M) Primary Care Provider: [name] Other Clinician: Referring Provider: Treating Provider/Extender: Kern Reap in Treatment: 0 Information Obtained from: Patient Chief Complaint 11/20/2019; patient is here with for review of multiple wounds over the right lower leg, right great and right second toe 09/08/2021; patient presents for wound to the right lateral foot 04/27/2023: wound on right lateral foot (5th metatarsal head level) and multiple RLE wounds Electronic Signature(s) Signed: 04/27/2023 2:17:15 PM By: Duanne Guess MD FACS Entered By: Duanne Guess on 04/27/2023 11:17:15 -------------------------------------------------------------------------------- Debridement Details Patient Name: Date of Service: [name] 04/27/2023 12:45 PM Medical Record Number: [id number] Patient Account Number: [id number] Date of Birth/Sex: Treating RN: 03/22/1971 (52 y.o. Marlan Palau Primary Care Provider: [name] Other Clinician: Referring Provider: Treating Provider/Extender: Kern Reap in Treatment: 0 Debridement Performed for Assessment: Wound #10 Right,Anterior Lower Leg Performed By: Physician Duanne Guess, MD The following information was scribed by: Samuella Bruin The information was scribed for: Duanne Guess Debridement Type: Debridement Severity of Tissue Pre Debridement: Fat layer exposed Level of Consciousness (Pre-procedure): Awake and Alert Pre-procedure Verification/Time Out Yes - 13:26 Taken: Start Time: 13:26 Pain Control:  Lidocaine 4% Topical Solution Percent of Wound Bed Debrided: 100% T Area Debrided (cm): otal 0.99 Tissue and other material debrided: Non-Viable, Eschar, Slough, Slough Level: Non-Viable Tissue Debridement Description: Selective/Open Wound Instrument: Curette Bleeding: Minimum Hemostasis Achieved: Pressure Response to Treatment: Procedure was tolerated well Level of Consciousness (Post- Awake and Alert procedure): Post Debridement Measurements of Total Wound Length: (cm) 1.4 Width: (cm) 0.9 Depth: (cm) 0.1 Volume: (cm) 0.099 TRAYLIN, FILBERT ([id number]) [id number] Page 2 of 13 Character of Wound/Ulcer Post Debridement: Improved Severity of Tissue Post Debridement: Fat layer exposed Post Procedure Diagnosis Same as Pre-procedure Electronic Signature(s) Signed: 04/27/2023 4:15:04 PM By: Samuella Bruin Signed: 04/27/2023 4:38:34 PM By: Duanne Guess MD FACS Entered By: Samuella Bruin on 04/27/2023 10:27:11 -------------------------------------------------------------------------------- Debridement Details Patient Name: Date of Service: [name] 04/27/2023 12:45 PM Medical Record Number: [id number] Patient Account Number: [id number] Date of Birth/Sex: Treating RN: September 20, 1970 (52 y.o. Marlan Palau Primary Care Provider: [name] Other Clinician: Referring Provider: Treating Provider/Extender: Kern Reap in Treatment: 0 Debridement Performed for Assessment: Wound #11 Right,Proximal,Anterior Lower Leg Performed By: Physician Duanne Guess, MD The following information was scribed by: Samuella Bruin The information was scribed for: Duanne Guess Debridement Type: Debridement Level of Consciousness (Pre-procedure): Awake and Alert Pre-procedure Verification/Time Out Yes - 13:26 Taken: Start Time: 13:26 Pain Control: Lidocaine 4% Topical Solution Percent of Wound Bed  Debrided: 100% T Area Debrided (cm): otal 0.08 Tissue and other material debrided: Non-Viable, Eschar, Slough, Slough Level: Non-Viable Tissue Debridement Description: Selective/Open Wound Instrument: Curette Bleeding: Minimum Hemostasis Achieved: Pressure Response to Treatment: Procedure was tolerated well Level of Consciousness (Post- Awake and Alert procedure): Post Debridement Measurements of Total Wound Length: (cm) 0.5 Width: (cm) 0.2 Depth: (cm) 0.1 Volume: (cm) 0.008 Character of Wound/Ulcer Post Debridement: Improved Post Procedure Diagnosis Same as Pre-procedure Electronic Signature(s) Signed: 04/27/2023 4:15:04 PM By: Samuella Bruin Signed: 04/27/2023 4:38:34 PM By: Duanne Guess MD FACS Entered By: Samuella Bruin on 04/27/2023  10:29:03 Debridement Details -------------------------------------------------------------------------------- NICOLUS, METCALFE ([id number]) [id number] Page 3 of 13 Patient Name: Date of Service: MO [name] NA THA Vaughan Sine 04/27/2023 12:45 PM Medical Record Number: [id number] Patient Account Number: [id number] Date of Birth/Sex: Treating RN: 06-28-1971 (52 y.o. Marlan Palau Primary Care Provider: [name] Other Clinician: Referring Provider: Treating Provider/Extender: Kern Reap in Treatment: 0 Debridement Performed for Assessment: Wound #9 Right T Fifth oe Performed By: Physician Duanne Guess, MD The following information was scribed by: Samuella Bruin The information was scribed for: Duanne Guess Debridement Type: Debridement Severity of Tissue Pre Debridement: Fat layer exposed Level of Consciousness (Pre-procedure): Awake and Alert Pre-procedure Verification/Time Out Yes - 13:26 Taken: Start Time: 13:26 Pain Control: Lidocaine 4% Topical Solution Percent of Wound Bed Debrided: 100% T Area Debrided (cm): otal 6.28 Tissue and other  material debrided: Non-Viable, Callus, Slough, Skin: Epidermis, Slough Level: Skin/Epidermis Debridement Description: Selective/Open Wound Instrument: Curette, Forceps, Scissors Bleeding: Minimum Hemostasis Achieved: Pressure Response to Treatment: Procedure was tolerated well Level of Consciousness (Post- Awake and Alert procedure): Post Debridement Measurements of Total Wound Length: (cm) 4 Width: (cm) 2 Depth: (cm) 0.1 Volume: (cm) 0.628 Character of Wound/Ulcer Post Debridement: Improved Severity of Tissue Post Debridement: Fat layer exposed Post Procedure Diagnosis Same as Pre-procedure Electronic Signature(s) Signed: 04/27/2023 4:15:04 PM By: Samuella Bruin Signed: 04/27/2023 4:38:34 PM By: Duanne Guess MD FACS Entered By: Samuella Bruin on 04/27/2023 10:31:44 -------------------------------------------------------------------------------- HPI Details Patient Name: Date of Service: [name] 04/27/2023 12:45 PM Medical Record Number: [id number] Patient Account Number: [id number] Date of Birth/Sex: Treating RN: 1970/12/14 (52 y.o. M) Primary Care Provider: [name] Other Clinician: Referring Provider: Treating Provider/Extender: Kern Reap in Treatment: 0 History of Present Illness HPI Description: ADMISSION 11/20/2019; this is a 52 year old man with poorly controlled type 2 diabetes. Roughly 3 to 4 weeks ago he noted swelling of his leg open wounds with draining sores. This happened fairly suddenly. He was seen in the ER on 11/07/2019 noted to have wounds of the right lower extremity x2 weeks at that point. An x-ray of the area was negative one of the wounds was cultured showing MSSA and group B strep. He was noted to have multiple wounds. He was given a course of doxycycline for 7 days white count was 8.4 hemoglobin 9.7. Since then he has been washing the leg and applying dry gauze. He is here in the clinic for  review of this. 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 At that point his culture was MRSA. No no 11/27/19-Patient returns after starting in the clinic for open leg wounds on his right and his right second toe. We have been applying silver alginate and 3 layer DUFFY, BESANCON ([id number]) [id number] Page 4 of 13 compression on the right. Patient completed course of doxycycline. The wounds are looking slightly smaller, the right posterior wound appears to have healed 6/8; patient is here in follow-up for open wounds on his right leg and his right second toe. X-ray I did last time was strangely negative. I gave him a course of Keflex which she is completed the area on the tip of the second toe is actually close over. All of the wounds on his right anterior lateral and posterior leg are considerably better. There is

## 2023-04-27 NOTE — Progress Notes (Signed)
BURLIN, GODKIN ([id number]) [id number] Page 1 of 12 Visit Report for 04/27/2023 Allergy List Details Patient Name: Date of Service: [name] NA THA N [name] 04/27/2023 12:45 PM Medical Record Number: [id number] Patient Account Number: [id number] Date of Birth/Sex: Treating RN: [birth year] (52 y.o. Todd Mccoy Primary Care Todd Mccoy: [name] Other Clinician: Referring Todd Mccoy: Treating Todd Mccoy/Extender: Polly Cobia Weeks in Treatment: 0 Allergies Active Allergies No Known Allergies Allergy Notes Electronic Signature(s) Signed: 04/27/2023 4:15:04 PM By: Samuella Bruin Entered By: Samuella Bruin on 04/26/2023 10:45:38 -------------------------------------------------------------------------------- Arrival Information Details Patient Name: Date of Service: [name] 04/27/2023 12:45 PM Medical Record Number: [id number] Patient Account Number: [id number] Date of Birth/Sex: Treating RN: [birth year] (52 y.o. Todd Mccoy Primary Care Todd Mccoy: [name] Other Clinician: Referring Todd Mccoy: Treating Todd Mccoy/Extender: Todd Mccoy in Treatment: 0 Visit Information Patient Arrived: Wheel Chair Arrival Time: 12:50 Accompanied By: nephew Transfer Assistance: Manual Patient Identification Verified: Yes Secondary Verification Process Completed: Yes History Since Last Visit Electronic Signature(s) Signed: 04/27/2023 4:15:04 PM By: Samuella Bruin Entered By: Samuella Bruin on 04/27/2023 09:51:20 -------------------------------------------------------------------------------- Clinic Level of Care Assessment Details Patient Name: Date of Service: [name] 04/27/2023 12:45 PM Medical Record Number: [id number] Patient Account Number: [id number] Date of Birth/Sex: Treating RN: 1970-07-12 (52 y.o. Todd Mccoy Primary Care  Maniyah Moller: [name] Other Clinician: ADEDAMOLA, GASPARYAN ([id number]) [id number] Page 2 of 12 Referring Shields Pautz: Treating Natayla Cadenhead/Extender: Todd Mccoy in Treatment: 0 Clinic Level of Care Assessment Items TOOL 1 Quantity Score X- 1 0 Use when EandM and Procedure is performed on INITIAL visit ASSESSMENTS - Nursing Assessment / Reassessment X- 1 20 General Physical Exam (combine w/ comprehensive assessment (listed just below) when performed on new pt. evals) X- 1 25 Comprehensive Assessment (HX, ROS, Risk Assessments, Wounds Hx, etc.) ASSESSMENTS - Wound and Skin Assessment / Reassessment []  - 0 Dermatologic / Skin Assessment (not related to wound area) ASSESSMENTS - Ostomy and/or Continence Assessment and Care []  - 0 Incontinence Assessment and Management []  - 0 Ostomy Care Assessment and Management (repouching, etc.) PROCESS - Coordination of Care X - Simple Patient / Family Education for ongoing care 1 15 []  - 0 Complex (extensive) Patient / Family Education for ongoing care X- 1 10 Staff obtains Chiropractor, Records, T Results / Process Orders est []  - 0 Staff telephones HHA, Nursing Homes / Clarify orders / etc []  - 0 Routine Transfer to another Facility (non-emergent condition) []  - 0 Routine Hospital Admission (non-emergent condition) X- 1 15 New Admissions / Manufacturing engineer / Ordering NPWT Apligraf, etc. , []  - 0 Emergency Hospital Admission (emergent condition) PROCESS - Special Needs []  - 0 Pediatric / Minor Patient Management []  - 0 Isolation Patient Management []  - 0 Hearing / Language / Visual special needs []  - 0 Assessment of Community assistance (transportation, D/C planning, etc.) []  - 0 Additional assistance / Altered mentation []  - 0 Support Surface(s) Assessment (bed, cushion, seat, etc.) INTERVENTIONS - Miscellaneous []  - 0 External ear exam []  - 0 Patient Transfer  (multiple staff / Nurse, adult / Similar devices) []  - 0 Simple Staple / Suture removal (25 or less) []  - 0 Complex Staple / Suture removal (26 or more) []  - 0 Hypo/Hyperglycemic Management (do not check if billed separately) []  - 0 Ankle / Brachial Index (ABI) - do not check if billed separately Has the patient been seen  BURLIN, GODKIN ([id number]) [id number] Page 1 of 12 Visit Report for 04/27/2023 Allergy List Details Patient Name: Date of Service: [name] NA THA N [name] 04/27/2023 12:45 PM Medical Record Number: [id number] Patient Account Number: [id number] Date of Birth/Sex: Treating RN: [birth year] (52 y.o. Todd Mccoy Primary Care Todd Mccoy: [name] Other Clinician: Referring Todd Mccoy: Treating Todd Mccoy/Extender: Polly Cobia Weeks in Treatment: 0 Allergies Active Allergies No Known Allergies Allergy Notes Electronic Signature(s) Signed: 04/27/2023 4:15:04 PM By: Samuella Bruin Entered By: Samuella Bruin on 04/26/2023 10:45:38 -------------------------------------------------------------------------------- Arrival Information Details Patient Name: Date of Service: [name] 04/27/2023 12:45 PM Medical Record Number: [id number] Patient Account Number: [id number] Date of Birth/Sex: Treating RN: [birth year] (52 y.o. Todd Mccoy Primary Care Todd Mccoy: [name] Other Clinician: Referring Todd Mccoy: Treating Todd Mccoy/Extender: Todd Mccoy in Treatment: 0 Visit Information Patient Arrived: Wheel Chair Arrival Time: 12:50 Accompanied By: nephew Transfer Assistance: Manual Patient Identification Verified: Yes Secondary Verification Process Completed: Yes History Since Last Visit Electronic Signature(s) Signed: 04/27/2023 4:15:04 PM By: Samuella Bruin Entered By: Samuella Bruin on 04/27/2023 09:51:20 -------------------------------------------------------------------------------- Clinic Level of Care Assessment Details Patient Name: Date of Service: [name] 04/27/2023 12:45 PM Medical Record Number: [id number] Patient Account Number: [id number] Date of Birth/Sex: Treating RN: 1970-07-12 (52 y.o. Todd Mccoy Primary Care  Maniyah Moller: [name] Other Clinician: ADEDAMOLA, GASPARYAN ([id number]) [id number] Page 2 of 12 Referring Shields Pautz: Treating Natayla Cadenhead/Extender: Todd Mccoy in Treatment: 0 Clinic Level of Care Assessment Items TOOL 1 Quantity Score X- 1 0 Use when EandM and Procedure is performed on INITIAL visit ASSESSMENTS - Nursing Assessment / Reassessment X- 1 20 General Physical Exam (combine w/ comprehensive assessment (listed just below) when performed on new pt. evals) X- 1 25 Comprehensive Assessment (HX, ROS, Risk Assessments, Wounds Hx, etc.) ASSESSMENTS - Wound and Skin Assessment / Reassessment []  - 0 Dermatologic / Skin Assessment (not related to wound area) ASSESSMENTS - Ostomy and/or Continence Assessment and Care []  - 0 Incontinence Assessment and Management []  - 0 Ostomy Care Assessment and Management (repouching, etc.) PROCESS - Coordination of Care X - Simple Patient / Family Education for ongoing care 1 15 []  - 0 Complex (extensive) Patient / Family Education for ongoing care X- 1 10 Staff obtains Chiropractor, Records, T Results / Process Orders est []  - 0 Staff telephones HHA, Nursing Homes / Clarify orders / etc []  - 0 Routine Transfer to another Facility (non-emergent condition) []  - 0 Routine Hospital Admission (non-emergent condition) X- 1 15 New Admissions / Manufacturing engineer / Ordering NPWT Apligraf, etc. , []  - 0 Emergency Hospital Admission (emergent condition) PROCESS - Special Needs []  - 0 Pediatric / Minor Patient Management []  - 0 Isolation Patient Management []  - 0 Hearing / Language / Visual special needs []  - 0 Assessment of Community assistance (transportation, D/C planning, etc.) []  - 0 Additional assistance / Altered mentation []  - 0 Support Surface(s) Assessment (bed, cushion, seat, etc.) INTERVENTIONS - Miscellaneous []  - 0 External ear exam []  - 0 Patient Transfer  (multiple staff / Nurse, adult / Similar devices) []  - 0 Simple Staple / Suture removal (25 or less) []  - 0 Complex Staple / Suture removal (26 or more) []  - 0 Hypo/Hyperglycemic Management (do not check if billed separately) []  - 0 Ankle / Brachial Index (ABI) - do not check if billed separately Has the patient been seen  With 51M Medipore H Soft Cloth Surgical T ape, 4 x 10 (in/yd) Discharge Instruction: Secure with tape as directed. Compression Wrap Kerlix Roll  4.5x3.1 (in/yd) Discharge Instruction: Apply Kerlix and Coban compression as directed. Coban Self-Adherent Wrap 4x5 (in/yd) Discharge Instruction: Apply over Kerlix as directed. DERRECK, FORTUNA ([id number]) [id number] Page 11 of 12 Compression Stockings Add-Ons Electronic Signature(s) Signed: 04/27/2023 4:15:04 PM By: Samuella Bruin Entered By: Samuella Bruin on 04/27/2023 10:28:38 -------------------------------------------------------------------------------- Wound Assessment Details Patient Name: Date of Service: [name] 04/27/2023 12:45 PM Medical Record Number: [id number] Patient Account Number: [id number] Date of Birth/Sex: Treating RN: [birth year] (52 y.o. Todd Mccoy Primary Care Elsi Stelzer: [name] Other Clinician: Referring Johnthomas Lader: Treating Jonathon Tan/Extender: Todd Mccoy in Treatment: 0 Wound Status Wound Number: 9 Primary Diabetic Wound/Ulcer of the Lower Extremity Etiology: Wound Location: Right T Fifth oe Wound Open Wounding Event: Gradually Appeared Status: Date Acquired: 01/03/2023 Comorbid Anemia, Congestive Heart Failure, Hypertension, Peripheral Arterial Weeks Of Treatment: 0 History: Disease, Type II Diabetes, End Stage Renal Disease, Clustered Wound: No Osteomyelitis, Neuropathy Photos Wound Measurements Length: (cm) 4 Width: (cm) 2 Depth: (cm) 0.1 Area: (cm) 6.283 Volume: (cm) 0.628 % Reduction in Area: % Reduction in Volume: Epithelialization: Small (1-33%) Tunneling: No Undermining: No Wound Description Classification: Grade 1 Wound Margin: Distinct, outline attached Exudate Amount: Medium Exudate Type: Serosanguineous Exudate Color: red, brown Foul Odor After Cleansing: No Slough/Fibrino Yes Wound Bed Granulation Amount: Small (1-33%) Exposed Structure Granulation Quality: Red Fascia Exposed: No Necrotic Amount: Large (67-100%) Fat Layer  (Subcutaneous Tissue) Exposed: Yes Necrotic Quality: Eschar, Adherent Slough Tendon Exposed: No Muscle Exposed: No Joint Exposed: No Bone Exposed: No [address] OSKAR, MANRIQUEZ ([id number]) [id number] Page 12 of 12 No Abnormalities Noted: No No Abnormalities Noted: Yes Callus: Yes Temperature / Pain Temperature: No Abnormality Moisture No Abnormalities Noted: No Dry / Scaly: Yes Treatment Notes Wound #9 (Toe Fifth) Wound Laterality: Right Cleanser Soap and Water Discharge Instruction: May shower and wash wound with dial antibacterial soap and water prior to dressing change. Wound Cleanser Discharge Instruction: Cleanse the wound with wound cleanser prior to applying a clean dressing using gauze sponges, not tissue or cotton balls. Peri-Wound Care Sween Lotion (Moisturizing lotion) Discharge Instruction: Apply moisturizing lotion as directed Topical Primary Dressing Maxorb Extra Ag+ Alginate Dressing, 2x2 (in/in) Discharge Instruction: Apply to wound bed as instructed Secondary Dressing Woven Gauze Sponge, Non-Sterile 4x4 in Discharge Instruction: Apply over primary dressing as directed. Secured With 51M Medipore H Soft Cloth Surgical T ape, 4 x 10 (in/yd) Discharge Instruction: Secure with tape as directed. Compression Wrap Compression Stockings Add-Ons Electronic Signature(s) Signed: 04/27/2023 4:15:04 PM By: Samuella Bruin Entered By: Samuella Bruin on 04/27/2023 10:03:30 -------------------------------------------------------------------------------- Vitals Details Patient Name: Date of Service: [name] 04/27/2023 12:45 PM Medical Record Number: [id number] Patient Account Number: [id number] Date of Birth/Sex: Treating RN: 08-27-1970 (52 y.o. Todd Mccoy Primary Care Chrystie Hagwood: [name] Other Clinician: Referring Royelle Hinchman: Treating Endiya Klahr/Extender: Todd Mccoy in Treatment: 0 Vital Signs Time Taken: 12:51 Temperature (F): 97.5 Pulse (bpm): 77 Respiratory Rate (breaths/min): 16 Blood Pressure (mmHg): 143/71 Reference Range: 80 - 120 mg / dl Electronic Signature(s) Signed: 04/27/2023 4:15:04 PM By: Samuella Bruin Entered By: Samuella Bruin on 04/27/2023 09:51:44  at the hospital within the last three years: Yes Total Score: 85 Level Of Care: New/Established - Level 3 Electronic Signature(s) Signed: 04/27/2023 4:15:04 PM By: Samuella Bruin Entered By: Samuella Bruin on 04/27/2023 10:15:14 Encounter Discharge Information Details -------------------------------------------------------------------------------- [name] ([id number]) [id number].pdf Page 3 of 12 Patient Name: Date of Service: [name] NA THA Vaughan Sine 04/27/2023 12:45 PM Medical Record Number: [id number] Patient Account Number: [id number] Date of Birth/Sex: Treating RN: 20-Jun-1971 (52 y.o. Todd Mccoy Primary Care Medha Pippen: [name] Other Clinician: Referring Decklin Weddington: Treating Sakara Lehtinen/Extender: Todd Mccoy in Treatment: 0 Encounter Discharge Information Items Post Procedure Vitals Discharge Condition: Stable Temperature (F): 97.5 Ambulatory Status: Wheelchair Pulse (bpm): 77 Discharge Destination: Home Respiratory Rate (breaths/min): 16 Transportation: Private Auto Blood Pressure (mmHg): 143/71 Accompanied By: nephew Schedule Follow-up Appointment: Yes Clinical Summary of Care: Patient Declined Electronic Signature(s) Signed: 04/27/2023 4:15:04 PM By: Samuella Bruin Entered By: Samuella Bruin on 04/27/2023 10:50:52 -------------------------------------------------------------------------------- Lower Extremity Assessment Details Patient Name: Date of Service: [name] 04/27/2023 12:45 PM Medical Record Number: [id number] Patient Account  Number: [id number] Date of Birth/Sex: Treating RN: 01-Apr-1971 (52 y.o. Todd Mccoy Primary Care Nury Nebergall: [name] Other Clinician: Referring Heidi Maclin: Treating Mithcell Schumpert/Extender: Todd Mccoy in Treatment: 0 Edema Assessment Assessed: [Left: No] [Right: No] [Left: Edema] [Right: :] Calf Left: Right: Point of Measurement: From Medial Instep 41.8 cm Ankle Left: Right: Point of Measurement: From Medial Instep 27 cm Knee To Floor Left: Right: From Medial Instep 46 cm Vascular Assessment Pulses: Dorsalis Pedis Palpable: [Right:Yes] Extremity colors, hair growth, and conditions: Extremity Color: [Right:Normal] Hair Growth on Extremity: [Right:No] Temperature of Extremity: [Right:Warm] Capillary Refill: [Right:< 3 seconds] Dependent Rubor: [Right:No] Blanched when Elevated: [Right:No No] Toe Nail Assessment Left: Right: Thick: Yes DiscoloredMILA, BIRNER ([id number]) [id number] Page 4 of 12 Deformed: No Improper Length and Hygiene: Yes Electronic Signature(s) Signed: 04/27/2023 4:15:04 PM By: Samuella Bruin Entered By: Samuella Bruin on 04/27/2023 09:58:01 -------------------------------------------------------------------------------- Multi Wound Chart Details Patient Name: Date of Service: [name] 04/27/2023 12:45 PM Medical Record Number: [id number] Patient Account Number: [id number] Date of Birth/Sex: Treating RN: 1971-06-03 (52 y.o. M) Primary Care Nikolas Casher: [name] Other Clinician: Referring Birgitta Uhlir: Treating Rondall Radigan/Extender: Todd Mccoy in Treatment: 0 Vital Signs Height(in): Pulse(bpm): 77 Weight(lbs): Blood Pressure(mmHg): 143/71 Body Mass Index(BMI): Temperature(F): 97.5 Respiratory Rate(breaths/min): 16 [10:Photos:] [11:No Photos] Right, Anterior Lower Leg Right, Proximal, Anterior Lower Leg Right T  Fifth oe Wound Location: Gradually Appeared Gradually Appeared Gradually Appeared Wounding Event: Venous Leg Ulcer Lymphedema Diabetic Wound/Ulcer of the Lower Primary Etiology: Extremity Anemia, Congestive Heart Failure, Anemia, Congestive Heart Failure, Anemia, Congestive Heart Failure, Comorbid History: Hypertension, Peripheral Arterial Hypertension, Peripheral Arterial Hypertension, Peripheral Arterial Disease, Type II Diabetes, End Stage Disease, Type II Diabetes, End Stage Disease, Type II Diabetes, End Stage Renal Disease, Osteomyelitis, Renal Disease, Osteomyelitis, Renal Disease, Osteomyelitis, Neuropathy Neuropathy Neuropathy 01/03/2023 01/03/2023 01/03/2023 Date Acquired: 0 0 0 Weeks of Treatment: Open Open Open Wound Status: No No No Wound Recurrence: 1.4x0.9x0.1 0.5x0.2x0.1 4x2x0.1 Measurements L x W x D (cm) 0.99 0.079 6.283 A (cm) : rea 0.099 0.008 0.628 Volume (cm) : Full Thickness Without Exposed Full Thickness Without Exposed Grade 1 Classification: Support Structures Support Structures None Present None Present Medium Exudate A mount: N/A N/A Serosanguineous Exudate Type: N/A N/A red, brown Exudate Color: Distinct, outline attached Distinct, outline attached Distinct, outline attached  at the hospital within the last three years: Yes Total Score: 85 Level Of Care: New/Established - Level 3 Electronic Signature(s) Signed: 04/27/2023 4:15:04 PM By: Samuella Bruin Entered By: Samuella Bruin on 04/27/2023 10:15:14 Encounter Discharge Information Details -------------------------------------------------------------------------------- [name] ([id number]) [phone].pdf Page 3 of 12 Patient Name: Date of Service: [name] NA THA Vaughan Sine 04/27/2023 12:45 PM Medical Record Number: [id number] Patient Account Number: [id number] Date of Birth/Sex: Treating RN: 20-Jun-1971 (52 y.o. Todd Mccoy Primary Care Medha Pippen: [name] Other Clinician: Referring Decklin Weddington: Treating Sakara Lehtinen/Extender: Todd Mccoy in Treatment: 0 Encounter Discharge Information Items Post Procedure Vitals Discharge Condition: Stable Temperature (F): 97.5 Ambulatory Status: Wheelchair Pulse (bpm): 77 Discharge Destination: Home Respiratory Rate (breaths/min): 16 Transportation: Private Auto Blood Pressure (mmHg): 143/71 Accompanied By: nephew Schedule Follow-up Appointment: Yes Clinical Summary of Care: Patient Declined Electronic Signature(s) Signed: 04/27/2023 4:15:04 PM By: Samuella Bruin Entered By: Samuella Bruin on 04/27/2023 10:50:52 -------------------------------------------------------------------------------- Lower Extremity Assessment Details Patient Name: Date of Service: [name] 04/27/2023 12:45 PM Medical Record Number: [id number] Patient Account  Number: [id number] Date of Birth/Sex: Treating RN: 01-Apr-1971 (52 y.o. Todd Mccoy Primary Care Nury Nebergall: [name] Other Clinician: Referring Heidi Maclin: Treating Mithcell Schumpert/Extender: Todd Mccoy in Treatment: 0 Edema Assessment Assessed: [Left: No] [Right: No] [Left: Edema] [Right: :] Calf Left: Right: Point of Measurement: From Medial Instep 41.8 cm Ankle Left: Right: Point of Measurement: From Medial Instep 27 cm Knee To Floor Left: Right: From Medial Instep 46 cm Vascular Assessment Pulses: Dorsalis Pedis Palpable: [Right:Yes] Extremity colors, hair growth, and conditions: Extremity Color: [Right:Normal] Hair Growth on Extremity: [Right:No] Temperature of Extremity: [Right:Warm] Capillary Refill: [Right:< 3 seconds] Dependent Rubor: [Right:No] Blanched when Elevated: [Right:No No] Toe Nail Assessment Left: Right: Thick: Yes DiscoloredMILA, BIRNER ([id number]) [id number] Page 4 of 12 Deformed: No Improper Length and Hygiene: Yes Electronic Signature(s) Signed: 04/27/2023 4:15:04 PM By: Samuella Bruin Entered By: Samuella Bruin on 04/27/2023 09:58:01 -------------------------------------------------------------------------------- Multi Wound Chart Details Patient Name: Date of Service: [name] 04/27/2023 12:45 PM Medical Record Number: [id number] Patient Account Number: [id number] Date of Birth/Sex: Treating RN: 1971-06-03 (52 y.o. M) Primary Care Nikolas Casher: [name] Other Clinician: Referring Birgitta Uhlir: Treating Rondall Radigan/Extender: Todd Mccoy in Treatment: 0 Vital Signs Height(in): Pulse(bpm): 77 Weight(lbs): Blood Pressure(mmHg): 143/71 Body Mass Index(BMI): Temperature(F): 97.5 Respiratory Rate(breaths/min): 16 [10:Photos:] [11:No Photos] Right, Anterior Lower Leg Right, Proximal, Anterior Lower Leg Right T  Fifth oe Wound Location: Gradually Appeared Gradually Appeared Gradually Appeared Wounding Event: Venous Leg Ulcer Lymphedema Diabetic Wound/Ulcer of the Lower Primary Etiology: Extremity Anemia, Congestive Heart Failure, Anemia, Congestive Heart Failure, Anemia, Congestive Heart Failure, Comorbid History: Hypertension, Peripheral Arterial Hypertension, Peripheral Arterial Hypertension, Peripheral Arterial Disease, Type II Diabetes, End Stage Disease, Type II Diabetes, End Stage Disease, Type II Diabetes, End Stage Renal Disease, Osteomyelitis, Renal Disease, Osteomyelitis, Renal Disease, Osteomyelitis, Neuropathy Neuropathy Neuropathy 01/03/2023 01/03/2023 01/03/2023 Date Acquired: 0 0 0 Weeks of Treatment: Open Open Open Wound Status: No No No Wound Recurrence: 1.4x0.9x0.1 0.5x0.2x0.1 4x2x0.1 Measurements L x W x D (cm) 0.99 0.079 6.283 A (cm) : rea 0.099 0.008 0.628 Volume (cm) : Full Thickness Without Exposed Full Thickness Without Exposed Grade 1 Classification: Support Structures Support Structures None Present None Present Medium Exudate A mount: N/A N/A Serosanguineous Exudate Type: N/A N/A red, brown Exudate Color: Distinct, outline attached Distinct, outline attached Distinct, outline attached  BURLIN, GODKIN ([id number]) [id number] Page 1 of 12 Visit Report for 04/27/2023 Allergy List Details Patient Name: Date of Service: [name] NA THA N [name] 04/27/2023 12:45 PM Medical Record Number: [id number] Patient Account Number: [id number] Date of Birth/Sex: Treating RN: [birth year] (52 y.o. Todd Mccoy Primary Care Todd Mccoy: [name] Other Clinician: Referring Todd Mccoy: Treating Todd Mccoy/Extender: Polly Cobia Weeks in Treatment: 0 Allergies Active Allergies No Known Allergies Allergy Notes Electronic Signature(s) Signed: 04/27/2023 4:15:04 PM By: Samuella Bruin Entered By: Samuella Bruin on 04/26/2023 10:45:38 -------------------------------------------------------------------------------- Arrival Information Details Patient Name: Date of Service: [name] 04/27/2023 12:45 PM Medical Record Number: [id number] Patient Account Number: [id number] Date of Birth/Sex: Treating RN: [birth year] (52 y.o. Todd Mccoy Primary Care Todd Mccoy: [name] Other Clinician: Referring Todd Mccoy: Treating Todd Mccoy/Extender: Todd Mccoy in Treatment: 0 Visit Information Patient Arrived: Wheel Chair Arrival Time: 12:50 Accompanied By: nephew Transfer Assistance: Manual Patient Identification Verified: Yes Secondary Verification Process Completed: Yes History Since Last Visit Electronic Signature(s) Signed: 04/27/2023 4:15:04 PM By: Samuella Bruin Entered By: Samuella Bruin on 04/27/2023 09:51:20 -------------------------------------------------------------------------------- Clinic Level of Care Assessment Details Patient Name: Date of Service: [name] 04/27/2023 12:45 PM Medical Record Number: [id number] Patient Account Number: [id number] Date of Birth/Sex: Treating RN: 1970-07-12 (52 y.o. Todd Mccoy Primary Care  Maniyah Moller: [name] Other Clinician: ADEDAMOLA, GASPARYAN ([id number]) [id number] Page 2 of 12 Referring Shields Pautz: Treating Natayla Cadenhead/Extender: Todd Mccoy in Treatment: 0 Clinic Level of Care Assessment Items TOOL 1 Quantity Score X- 1 0 Use when EandM and Procedure is performed on INITIAL visit ASSESSMENTS - Nursing Assessment / Reassessment X- 1 20 General Physical Exam (combine w/ comprehensive assessment (listed just below) when performed on new pt. evals) X- 1 25 Comprehensive Assessment (HX, ROS, Risk Assessments, Wounds Hx, etc.) ASSESSMENTS - Wound and Skin Assessment / Reassessment []  - 0 Dermatologic / Skin Assessment (not related to wound area) ASSESSMENTS - Ostomy and/or Continence Assessment and Care []  - 0 Incontinence Assessment and Management []  - 0 Ostomy Care Assessment and Management (repouching, etc.) PROCESS - Coordination of Care X - Simple Patient / Family Education for ongoing care 1 15 []  - 0 Complex (extensive) Patient / Family Education for ongoing care X- 1 10 Staff obtains Chiropractor, Records, T Results / Process Orders est []  - 0 Staff telephones HHA, Nursing Homes / Clarify orders / etc []  - 0 Routine Transfer to another Facility (non-emergent condition) []  - 0 Routine Hospital Admission (non-emergent condition) X- 1 15 New Admissions / Manufacturing engineer / Ordering NPWT Apligraf, etc. , []  - 0 Emergency Hospital Admission (emergent condition) PROCESS - Special Needs []  - 0 Pediatric / Minor Patient Management []  - 0 Isolation Patient Management []  - 0 Hearing / Language / Visual special needs []  - 0 Assessment of Community assistance (transportation, D/C planning, etc.) []  - 0 Additional assistance / Altered mentation []  - 0 Support Surface(s) Assessment (bed, cushion, seat, etc.) INTERVENTIONS - Miscellaneous []  - 0 External ear exam []  - 0 Patient Transfer  (multiple staff / Nurse, adult / Similar devices) []  - 0 Simple Staple / Suture removal (25 or less) []  - 0 Complex Staple / Suture removal (26 or more) []  - 0 Hypo/Hyperglycemic Management (do not check if billed separately) []  - 0 Ankle / Brachial Index (ABI) - do not check if billed separately Has the patient been seen

## 2023-04-27 NOTE — Progress Notes (Signed)
JASPAR, MELLS ([id number]) [phone] [id number] Page 1 of 4 Visit Report for 04/27/2023 Abuse Risk Screen Details Patient Name: Date of Service: Todd Todd Poag NA THA N Todd Mccoy 04/27/2023 12:45 PM Medical Record Number: [id number] Patient Account Number: [id number] Date of Birth/Sex: Treating RN: [birth year] (52 y.o. Todd Mccoy Primary Care Todd Mccoy: Todd Mccoy Other Clinician: Referring Todd Mccoy: Treating Todd Mccoy/Extender: Todd Mccoy in Treatment: 0 Abuse Risk Screen Items Answer ABUSE RISK SCREEN: Has anyone close to you tried to hurt or harm you recentlyo No Do you feel uncomfortable with anyone in your familyo No Has anyone forced you do things that you didnt want to doo No Electronic Signature(s) Signed: 04/27/2023 4:15:04 PM By: Todd Mccoy Entered By: Todd Mccoy on 04/27/2023 09:53:23 -------------------------------------------------------------------------------- Activities of Daily Living Details Patient Name: Date of Service: Todd Todd Poag NA THA N S. 04/27/2023 12:45 PM Medical Record Number: [id number] Patient Account Number: [id number] Date of Birth/Sex: Treating RN: [birth year] (52 y.o. Todd Mccoy Primary Care Todd Mccoy: Todd Mccoy Other Clinician: Referring Todd Mccoy: Treating Todd Mccoy/Extender: Todd Mccoy in Treatment: 0 Activities of Daily Living Items Answer Activities of Daily Living (Please select one for each item) Drive Automobile Not Able T Medications ake Need Assistance Use T elephone Need Assistance Care for Appearance Completely Able Use T oilet Completely Able Bath / Shower Completely Able Dress Self Completely Able Feed Self Completely Able Walk Need Assistance Get In / Out Bed Completely Able Housework Need Assistance Prepare Meals Need Assistance Handle Money Need Assistance Shop for Self Need Assistance Electronic  Signature(s) Signed: 04/27/2023 4:15:04 PM By: Todd Mccoy Entered By: Todd Mccoy on 04/27/2023 09:54:00 [name] ([id number]) [id number] Page 2 of 4 -------------------------------------------------------------------------------- Education Screening Details Patient Name: Date of Service: Todd Todd Poag [place] THA N S. 04/27/2023 12:45 PM Medical Record Number: [id number] Patient Account Number: [id number] Date of Birth/Sex: Treating RN: 1970/07/22 (52 y.o. Todd Mccoy Primary Care Todd Mccoy: Todd Mccoy Other Clinician: Referring Todd Mccoy: Treating Todd Mccoy/Extender: Todd Mccoy in Treatment: 0 Primary Learner Assessed: Patient Learning Preferences/Education Level/Primary Language Learning Preference: Explanation, Demonstration, Video, Printed Material Highest Education Level: High School Preferred Language: English Cognitive Barrier Language Barrier: No Translator Needed: No Memory Deficit: No Emotional Barrier: No Cultural/Religious Beliefs Affecting Medical Care: No Physical Barrier Impaired Vision: Yes Legally Blind Impaired Hearing: No Decreased Hand dexterity: No Knowledge/Comprehension Knowledge Level: Medium Comprehension Level: Medium Ability to understand written instructions: Medium Ability to understand verbal instructions: Medium Motivation Anxiety Level: Calm Cooperation: Cooperative Education Importance: Acknowledges Need Interest in Health Problems: Asks Questions Perception: Coherent Willingness to Engage in Self-Management Medium Activities: Readiness to Engage in Self-Management Medium Activities: Electronic Signature(s) Signed: 04/27/2023 4:15:04 PM By: Todd Mccoy Entered By: Todd Mccoy on 04/27/2023 09:54:24 -------------------------------------------------------------------------------- Fall Risk Assessment Details Patient Name: Date  of Service: Todd Todd Poag NA THA N S. 04/27/2023 12:45 PM Medical Record Number: [id number] Patient Account Number: [id number] Date of Birth/Sex: Treating RN: [birth year] (52 y.o. Todd Mccoy Primary Care Todd Mccoy: Todd Mccoy Other Clinician: Referring Todd Mccoy: Treating Todd Mccoy/Extender: Todd Mccoy in Treatment: 0 Fall Risk Assessment Items Have you had 2 or more falls in the last 12 monthso 0 No Todd Mccoy ([id number]) [phone] [id number] Page 3 of 4 Have you had any fall that resulted in injury in the last 12 monthso 0 No FALLS RISK SCREEN History of falling - immediate or within 3 months 0 No Secondary diagnosis (  Do you have 2 or more medical diagnoseso) 15 Yes Ambulatory aid None/bed rest/wheelchair/nurse 0 Yes Crutches/cane/walker 0 No Furniture 0 No Intravenous therapy Access/Saline/Heparin Lock 0 No Gait/Transferring Normal/ bed rest/ wheelchair 0 Yes Weak (short steps with or without shuffle, stooped but able to lift head while walking, may seek 0 No support from furniture) Impaired (short steps with shuffle, may have difficulty arising from chair, head down, impaired 0 No balance) Mental Status Oriented to own ability 0 Yes Electronic Signature(s) Signed: 04/27/2023 4:15:04 PM By: Todd Mccoy Entered By: Todd Mccoy on 04/27/2023 09:54:35 -------------------------------------------------------------------------------- Foot Assessment Details Patient Name: Date of Service: Todd Todd Poag NA THA N S. 04/27/2023 12:45 PM Medical Record Number: [id number] Patient Account Number: [id number] Date of Birth/Sex: Treating RN: 02-May-1971 (52 y.o. Todd Mccoy Primary Care Todd Mccoy: Todd Mccoy Other Clinician: Referring Todd Mccoy: Treating Todd Mccoy/Extender: Todd Mccoy in Treatment: 0 Foot Assessment Items Site Locations + = Sensation present, -  = Sensation absent, C = Callus, U = Ulcer R = Redness, W = Warmth, M = Maceration, PU = Pre-ulcerative lesion F = Fissure, S = Swelling, D = Dryness Assessment Right: Left: Other Deformity: No No Prior Foot Ulcer: No No Prior Amputation: No No Charcot Joint: No No Ambulatory Status: Non-ambulatory Assistance Device: [address] Todd Mccoy ([id number]) [phone] [id number] Page 4 of 4 Gait: Electronic Signature(s) Signed: 04/27/2023 4:15:04 PM By: Todd Mccoy Entered By: Todd Mccoy on 04/27/2023 09:56:07 -------------------------------------------------------------------------------- Nutrition Risk Screening Details Patient Name: Date of Service: Todd Todd Poag NA THA N S. 04/27/2023 12:45 PM Medical Record Number: [id number] Patient Account Number: [id number] Date of Birth/Sex: Treating RN: 10/06/1970 (52 y.o. Todd Mccoy Primary Care Verenise Moulin: Todd Mccoy Other Clinician: Referring Jossette Zirbel: Treating Suleyma Wafer/Extender: Todd Mccoy in Treatment: 0 Height (in): Weight (lbs): Body Mass Index (BMI): Nutrition Risk Screening Items Score Screening NUTRITION RISK SCREEN: I have an illness or condition that made me change the kind and/or amount of food I eat 0 No I eat fewer than two meals per day 0 No I eat few fruits and vegetables, or milk products 0 No I have three or more drinks of beer, liquor or wine almost every day 0 No I have tooth or mouth problems that make it hard for me to eat 0 No I don't always have enough money to buy the food I need 0 No I eat alone most of the time 0 No I take three or more different prescribed or over-the-counter drugs a day 1 Yes Without wanting to, I have lost or gained 10 pounds in the last six months 0 No I am not always physically able to shop, cook and/or feed myself 0 No Nutrition Protocols Good Risk Protocol 0 No interventions needed Moderate Risk  Protocol High Risk Proctocol Risk Level: Good Risk Score: 1 Electronic Signature(s) Signed: 04/27/2023 4:15:04 PM By: Todd Mccoy Entered By: Todd Mccoy on 04/27/2023 09:54:55

## 2023-04-28 ENCOUNTER — Ambulatory Visit: Payer: Medicaid Other | Admitting: Family Medicine

## 2023-04-28 NOTE — Telephone Encounter (Signed)
Verbal order given for patient.

## 2023-04-28 NOTE — Telephone Encounter (Signed)
LVM for return call. 

## 2023-05-03 NOTE — Progress Notes (Unsigned)
Office Note     CC:  Right foot PAD. History of left AKA. History of diabetes, CHF  Requesting Provider:  [name], MD  HPI: Todd Mccoy is a 52 y.o. ([birth year]) male presenting at the request of .[name], MD for long-term HD access.    Patient is well-known to my clinic having previously been evaluated for right foot wounds.  Evaluation of right foot wounds. 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  He had a palpable pulse in his foot, with excellent toe pressure.  We agreed to treat conservatively.  Todd Mccoy presents today for long-term HD access due to progression chronic kidney disease.  He is CKD 5.  A native of [place], he moved to [place] years ago.  Todd Mccoy is mainly wheelchair-bound after undergoing left above-knee amputation.  Since that time, he has appreciated significant swelling in the right lower extremity.  This is led to wounds both on the calf and on the lateral aspect of the heel.  His niece has been helping him with wound care.  The pt is  on a statin for cholesterol management.  The pt is not on a daily aspirin.   Other AC:  - The pt is on medication for hypertension.   The pt is  diabetic.  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/2020   pt states recent MD visit /left eye worse than right   Dyslipidemia 12/10/2020   Hx of BKA, left (HCC)    Hypertension    Morbid obesity (HCC)    Osteomyelitis  (HCC)    Pneumonia     Past Surgical History:  Procedure Laterality Date   ABSCESS DRAINAGE     neck   AMPUTATION Left 11/14/2020   Procedure: LEFT 5TH RAY AMPUTATION;  Surgeon: Nadara Mustard, MD;  Location: [facility] OR;  Service: Orthopedics;  Laterality: Left;   AMPUTATION Left 12/10/2020   Procedure: LEFT BELOW KNEE AMPUTATION;  Surgeon: Nadara Mustard, MD;  Location: [facility] OR;  Service: Orthopedics;  Laterality: Left;   AMPUTATION TOE Right 03/25/2020   Procedure: AMPUTATION TOE;  Surgeon: Park Liter, DPM;  Location: [facility] ORS;  Service: Podiatry;  Laterality: Right;   INCISION AND DRAINAGE ABSCESS Right 09/07/2014   Procedure: INCISION AND DRAINAGE ABSCESS RIGHT FLANK;  Surgeon: Avel Peace, MD;  Location: [facility] ORS;  Service: General;  Laterality: Right;   IR FLUORO GUIDE CV LINE RIGHT  02/11/2023   IR US GUIDE VASC ACCESS RIGHT  02/11/2023   LEG AMPUTATION BELOW KNEE Left 12/10/2020   SCROTUM EXPLORATION      Social History   Socioeconomic History   Marital status: Single    Spouse name: Not on file   Number of children: 1   Years of education: Not on file   Highest education level: Not on file  Occupational History   Not on file  Tobacco  Use   Smoking status: Every Day    Current packs/day: 0.00    Average packs/day: 0.5 packs/day for 32.0 years (16.0 ttl pk-yrs)    Types: Cigarettes    Start date: 08/24/1989    Last attempt to quit: 08/24/2021    Years since quitting: 1.6    Passive exposure: Past   Smokeless tobacco: Former   Tobacco comments:    Quit smoking previously February 2023  Vaping Use   Vaping status: Never Used  Substance and Sexual Activity   Alcohol use: No   Drug use: No   Sexual activity: Not on file  Other Topics Concern   Not on file  Social History Narrative   Not on file   Social Determinants of Health   Financial Resource Strain: Low Risk  (09/30/2021)   Overall Financial Resource Strain (CARDIA)    Difficulty of Paying Living Expenses: Not  hard at all  Food Insecurity: No Food Insecurity (02/21/2023)   Hunger Vital Sign    Worried About Running Out of Food in the Last Year: Never true    Ran Out of Food in the Last Year: Never true  Transportation Needs: No Transportation Needs (03/02/2023)   PRAPARE - Administrator, Civil Service (Medical): No    Lack of Transportation (Non-Medical): No  Physical Activity: Inactive (09/30/2021)   Exercise Vital Sign    Days of Exercise per Week: 0 days    Minutes of Exercise per Session: 0 min  Stress: No Stress Concern Present (09/30/2021)   Todd Mccoy [employer] Occupational [employer] - Occupational Stress Questionnaire    Feeling of Stress : Only a little  Social Connections: Moderately Isolated (09/30/2021)   Social Connection and Isolation Panel [NHANES]    Frequency of Communication with Friends and Family: More than three times a week    Frequency of Social Gatherings with Friends and Family: More than three times a week    Attends Religious Services: 1 to 4 times per year    Active Member of [employer] or Organizations: No    Attends Banker Meetings: Never    Marital Status: Never married  Intimate Partner Violence: Patient Unable To Answer (02/06/2023)   Humiliation, Afraid, Rape, and Kick questionnaire    Fear of Current or Ex-Partner: Patient unable to answer    Emotionally Abused: Patient unable to answer    Physically Abused: Patient unable to answer    Sexually Abused: Patient unable to answer   Family History  Problem Relation Age of Onset   Diabetes Mother    Diabetes Father    Diabetes Brother    Colon cancer Neg Hx    Esophageal cancer Neg Hx    Pancreatic cancer Neg Hx    Stomach cancer Neg Hx    Liver disease Neg Hx    CAD Neg Hx     Current Outpatient Medications  Medication Sig Dispense Refill   acetaminophen (TYLENOL) 325 MG tablet Take 2 tablets (650 mg total) by mouth every 6 (six) hours as needed for mild pain. 30 tablet 0    atorvastatin (LIPITOR) 20 MG tablet Take 1 tablet (20 mg total) by mouth daily. 90 tablet 3   calcitRIOL (ROCALTROL) 0.25 MCG capsule Take 0.25 mcg by mouth daily.     carvedilol (COREG) 25 MG tablet TAKE 1 & 1/2 (ONE & ONE-HALF) TABLETS BY MOUTH TWICE DAILY 90 tablet 0   cetirizine (ZYRTEC ALLERGY) 10 MG tablet Take 1 tablet (10 mg  total) by mouth daily. 30 tablet 0   docusate sodium (COLACE) 100 MG capsule Take 1 capsule (100 mg total) by mouth 2 (two) times daily as needed for mild constipation. 10 capsule 0   ferrous sulfate 325 (65 FE) MG EC tablet Take 325 mg by mouth daily with breakfast.     furosemide (LASIX) 40 MG tablet Take 1 tablet (40 mg total) by mouth 2 (two) times daily. 60 tablet 2   hydrALAZINE (APRESOLINE) 100 MG tablet Take 100 mg by mouth 3 (three) times daily.     hydrOXYzine (ATARAX) 50 MG tablet Take 1 tablet (50 mg total) by mouth 3 (three) times daily as needed for anxiety or itching. 30 tablet 2   insulin aspart protamine- aspart (NOVOLOG MIX 70/30) (70-30) 100 UNIT/ML injection Inject into the skin.     isosorbide mononitrate (IMDUR) 60 MG 24 hr tablet TAKE ONE TABLET BY MOUTH ONCE DAILY 30 tablet 6   magnesium oxide (MAG-OX) 400 (240 Mg) MG tablet Take 1 tablet (400 mg total) by mouth 2 (two) times daily. 60 tablet 2   metFORMIN (GLUCOPHAGE-XR) 500 MG 24 hr tablet Take by mouth.     multivitamin (RENA-VIT) TABS tablet Take 1 tablet by mouth at bedtime. 30 tablet 2   Naphazoline HCl (CLEAR EYES OP) Place 1 drop into both eyes as needed (irritation).     polyethylene glycol (MIRALAX / GLYCOLAX) 17 g packet Take 17 g by mouth daily as needed for moderate constipation. 14 each 0   torsemide (DEMADEX) 20 MG tablet Take 80 mg by mouth daily.     No current facility-administered medications for this visit.    No Known Allergies   REVIEW OF SYSTEMS:  [X]  denotes positive finding, [ ]  denotes negative finding Cardiac  Comments:  Chest pain or chest pressure:     Shortness of breath upon exertion:    Short of breath when lying flat:    Irregular heart rhythm:        Vascular    Pain in calf, thigh, or hip brought on by ambulation:    Pain in feet at night that wakes you up from your sleep:     Blood clot in your veins:    Leg swelling:         Pulmonary    Oxygen at home:    Productive cough:     Wheezing:         Neurologic    Sudden weakness in arms or legs:     Sudden numbness in arms or legs:     Sudden onset of difficulty speaking or slurred speech:    Temporary loss of vision in one eye:     Problems with dizziness:         Gastrointestinal    Blood in stool:     Vomited blood:         Genitourinary    Burning when urinating:     Blood in urine:        Psychiatric    Major depression:         Hematologic    Bleeding problems:    Problems with blood clotting too easily:        Skin    Rashes or ulcers:        Constitutional    Fever or chills:      PHYSICAL EXAMINATION:  There were no vitals filed for this visit.  General:  WDWN in NAD; vital signs  documented above Gait: Not observed HENT: WNL, normocephalic Pulmonary: normal non-labored breathing , without wheezing Cardiac: regular HR Abdomen: soft, NT, no masses Skin: without rashes Vascular Exam/Pulses:  Right Left  Radial 2+ (normal) 2+ (normal)  Ulnar    Femoral    Popliteal    DP  bka  PT 2+ (normal)    Extremities: without ischemic changes, without Gangrene , without cellulitis; with open wounds;  Buffalo hump   Musculoskeletal: no muscle wasting or atrophy  Neurologic: A&O X 3;  No focal weakness or paresthesias are detected Psychiatric:  The pt has Normal affect.   Non-Invasive Vascular Imaging:   ABI Findings:  +---------+------------------+-----+---------+--------+  Right   Rt Pressure (mmHg)IndexWaveform Comment   +---------+------------------+-----+---------+--------+  Brachial 197                                        +---------+------------------+-----+---------+--------+  PTA     238               1.21 triphasic          +---------+------------------+-----+---------+--------+  DP      208               1.06 biphasic           +---------+------------------+-----+---------+--------+  Great Toe194               0.98 Normal             +---------+------------------+-----+---------+--------+   +--------+------------------+-----+--------+-------+  Left   Lt Pressure (mmHg)IndexWaveformComment  +--------+------------------+-----+--------+-------+  [id number]                                    +--------+------------------+-----+--------+-------+  PTA                                   BKA      +--------+------------------+-----+--------+-------+  DP                                    BKA      +--------+------------------+-----+--------+-------+   +-------+-----------+-----------+------------+------------+  ABI/TBIToday's ABIToday's TBIPrevious ABIPrevious TBI  +-------+-----------+-----------+------------+------------+  Right 1.21       0.98       1.07        0             +-------+-----------+-----------+------------+------------+  Left  BKA        BKA        1.07                      +-------+-----------+-----------+------------+------------+      ASSESSMENT/PLAN: RON HAAB is a 52 y.o. male presenting with tibial and lateral malleolar wounds associated with edema and fluid overload in the right leg. ABI was reviewed demonstrating relatively normal waveforms with a falsely elevated toe pressure.  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  Wounds are present on the pretibial area as well as the right heel.  The wound care he has received has been excellent, however Scotty needs elevation and compression.  He would benefit from wound center referral for possible Unna boot.  With current ABIs, I  do  not think he requires revascularization at this time.  Should his wounds worsen, I would offer lower extremity angiogram.  My plan is to see him back in the office in 2 months to assess improvement I asked that he elevate his leg when possible.  Recommend the following which can slow the progression of atherosclerosis and reduce the risk of major adverse cardiac / limb events:  Aspirin 81mg  PO QD.  Atorvastatin 40-80mg  PO QD (or other "high intensity" statin therapy). Complete cessation from all tobacco products. Blood glucose control with goal A1c < 7%. Blood pressure control with goal blood pressure < 140/90 mmHg. Lipid reduction therapy with goal LDL-C <100 mg/dL (<40 if symptomatic from PAD).     Victorino Sparrow, MD [facility] [phone]

## 2023-05-05 ENCOUNTER — Ambulatory Visit (HOSPITAL_COMMUNITY)
Admission: RE | Admit: 2023-05-05 | Discharge: 2023-05-05 | Disposition: A | Discharge status: Home / Self Care | Payer: Medicare Other | Source: Ambulatory Visit | Attending: Vascular Surgery | Admitting: Vascular Surgery

## 2023-05-05 ENCOUNTER — Ambulatory Visit (INDEPENDENT_AMBULATORY_CARE_PROVIDER_SITE_OTHER)
Admission: RE | Admit: 2023-05-05 | Discharge: 2023-05-05 | Disposition: A | Discharge status: Home / Self Care | Payer: Medicare Other | Source: Ambulatory Visit | Attending: Vascular Surgery | Admitting: Vascular Surgery

## 2023-05-05 ENCOUNTER — Ambulatory Visit (INDEPENDENT_AMBULATORY_CARE_PROVIDER_SITE_OTHER): Payer: Medicare Other | Admitting: Vascular Surgery

## 2023-05-05 VITALS — BP 130/81 | HR 63 | Temp 98.2°F | Resp 20 | Ht 70.0 in | Wt 195.0 lb

## 2023-05-05 DIAGNOSIS — N1832 Chronic kidney disease, stage 3b: Secondary | ICD-10-CM

## 2023-05-05 DIAGNOSIS — Z992 Dependence on renal dialysis: Secondary | ICD-10-CM | POA: Diagnosis not present

## 2023-05-05 DIAGNOSIS — N186 End stage renal disease: Secondary | ICD-10-CM

## 2023-05-06 ENCOUNTER — Encounter (HOSPITAL_BASED_OUTPATIENT_CLINIC_OR_DEPARTMENT_OTHER): Payer: Medicare Other | Attending: General Surgery | Admitting: General Surgery

## 2023-05-06 DIAGNOSIS — I5042 Chronic combined systolic (congestive) and diastolic (congestive) heart failure: Secondary | ICD-10-CM | POA: Diagnosis not present

## 2023-05-06 DIAGNOSIS — I132 Hypertensive heart and chronic kidney disease with heart failure and with stage 5 chronic kidney disease, or end stage renal disease: Secondary | ICD-10-CM | POA: Insufficient documentation

## 2023-05-06 DIAGNOSIS — E1122 Type 2 diabetes mellitus with diabetic chronic kidney disease: Secondary | ICD-10-CM | POA: Diagnosis not present

## 2023-05-06 DIAGNOSIS — E11621 Type 2 diabetes mellitus with foot ulcer: Secondary | ICD-10-CM | POA: Insufficient documentation

## 2023-05-06 DIAGNOSIS — L97812 Non-pressure chronic ulcer of other part of right lower leg with fat layer exposed: Secondary | ICD-10-CM | POA: Insufficient documentation

## 2023-05-06 DIAGNOSIS — L97512 Non-pressure chronic ulcer of other part of right foot with fat layer exposed: Secondary | ICD-10-CM | POA: Diagnosis not present

## 2023-05-06 DIAGNOSIS — N186 End stage renal disease: Secondary | ICD-10-CM | POA: Diagnosis not present

## 2023-05-06 DIAGNOSIS — E11622 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 ulcer: Secondary | ICD-10-CM | POA: Insufficient documentation

## 2023-05-06 NOTE — Progress Notes (Signed)
VOLLIE, BRUNTY ([id number]) [id number] Page 1 of 13 Visit Report for 05/06/2023 Chief Complaint Document Details Patient Name: Date of Service: Todd Mccoy. 05/06/2023 9:45 A M Medical Record Number: [id number] Patient Account Number: [id number] Date of Birth/Sex: Treating RN: Dec 10, 1970 (52 y.o. M) Primary Care Provider: [name] Other Clinician: Referring Provider: Treating Provider/Extender: Whitney Muse, Odette Horns Weeks in Treatment: 1 Information Obtained from: Patient Chief Complaint 11/20/2019; patient is here with for review of multiple wounds over the right lower leg, right great and right second toe 09/08/2021; patient presents for wound to the right lateral foot 04/27/2023: wound on right lateral foot (5th metatarsal head level) and multiple RLE wounds Electronic Signature(Mccoy) Signed: 05/06/2023 10:56:50 AM By: Duanne Guess MD FACS Entered By: Duanne Guess on 05/06/2023 07:56:50 -------------------------------------------------------------------------------- Debridement Details Patient Name: Date of Service: Todd Mccoy. 05/06/2023 9:45 A M Medical Record Number: [id number] Patient Account Number: [id number] Date of Birth/Sex: Treating RN: 05-17-1971 (52 y.o. Todd Mccoy Primary Care Provider: [name] Other Clinician: Referring Provider: Treating Provider/Extender: Camillo Flaming Weeks in Treatment: 1 Debridement Performed for Assessment: Wound #11 Right,Proximal,Anterior Lower Leg Performed By: Physician Duanne Guess, MD The following information was scribed by: Zenaida Deed The information was scribed for: Duanne Guess Debridement Type: Debridement Level of Consciousness (Pre-procedure): Awake and Alert Pre-procedure Verification/Time Out Yes - 10:45 Taken: Start Time: 10:45 Pain Control: Lidocaine 4% T opical Solution Percent of Wound Bed  Debrided: 100% T Area Debrided (cm): otal 0.2 Tissue and other material debrided: Viable, Non-Viable, Eschar, Slough, Subcutaneous, Slough Level: Skin/Subcutaneous Tissue Debridement Description: Excisional Instrument: Curette Bleeding: Minimum Hemostasis Achieved: Pressure Procedural Pain: 0 Post Procedural Pain: 0 Response to Treatment: Procedure was tolerated well Level of Consciousness (Post- Awake and Alert procedure): Post Debridement Measurements of Total Wound Length: (cm) 0.5 Width: (cm) 0.5 Depth: (cm) 0.1 Todd Mccoy, Todd Mccoy ([id number]) [id number] Page 2 of 13 Volume: (cm) 0.02 Character of Wound/Ulcer Post Debridement: Improved Post Procedure Diagnosis Same as Pre-procedure Electronic Signature(Mccoy) Signed: 05/06/2023 12:11:02 PM By: Duanne Guess MD FACS Signed: 05/06/2023 1:05:52 PM By: Zenaida Deed RN, [name] Entered By: Zenaida Deed on 05/06/2023 07:49:16 -------------------------------------------------------------------------------- Debridement Details Patient Name: Date of Service: Todd Mccoy. 05/06/2023 9:45 A M Medical Record Number: [id number] Patient Account Number: [id number] Date of Birth/Sex: Treating RN: [birth year] (52 y.o. Todd Mccoy Primary Care Provider: [name] Other Clinician: Referring Provider: Treating Provider/Extender: Camillo Flaming Weeks in Treatment: 1 Debridement Performed for Assessment: Wound #10 Right,Anterior Lower Leg Performed By: Physician Duanne Guess, MD The following information was scribed by: Zenaida Deed The information was scribed for: Duanne Guess Debridement Type: Debridement Level of Consciousness (Pre-procedure): Awake and Alert Pre-procedure Verification/Time Out Yes - 10:45 Taken: Start Time: 10:45 Pain Control: Lidocaine 4% T opical Solution Percent of Wound Bed Debrided: 100% T Area Debrided (cm): otal 0.6 Tissue  and other material debrided: Viable, Non-Viable, Eschar, Slough, Subcutaneous, Slough Level: Skin/Subcutaneous Tissue Debridement Description: Excisional Instrument: Curette Bleeding: Minimum Hemostasis Achieved: Pressure Procedural Pain: 0 Post Procedural Pain: 0 Response to Treatment: Procedure was tolerated well Level of Consciousness (Post- Awake and Alert procedure): Post Debridement Measurements of Total Wound Length: (cm) 1.1 Width: (cm) 0.7 Depth: (cm) 0.1 Volume: (cm) 0.06 Character of Wound/Ulcer Post Debridement: Improved Post Procedure Diagnosis Same as Pre-procedure Electronic Signature(Mccoy) Signed: 05/06/2023 12:11:02 PM By: Duanne Guess MD FACS Signed: 05/06/2023 1:05:52 PM By: Daneil Dan  Legrand Como, [name] Entered By: Zenaida Deed on 05/06/2023 07:49:46 Todd Mccoy ([id number]) [id number] Page 3 of 13 -------------------------------------------------------------------------------- Debridement Details Patient Name: Date of Service: Todd Mccoy. 05/06/2023 9:45 A M Medical Record Number: [id number] Patient Account Number: [id number] Date of Birth/Sex: Treating RN: 1970-10-14 (52 y.o. Todd Mccoy Primary Care Provider: [name] Other Clinician: Referring Provider: Treating Provider/Extender: Camillo Flaming Weeks in Treatment: 1 Debridement Performed for Assessment: Wound #9 Right T Fifth oe Performed By: Physician Duanne Guess, MD The following information was scribed by: Zenaida Deed The information was scribed for: Duanne Guess Debridement Type: Debridement Severity of Tissue Pre Debridement: Fat layer exposed Level of Consciousness (Pre-procedure): Awake and Alert Pre-procedure Verification/Time Out Yes - 10:45 Taken: Start Time: 10:45 Pain Control: Lidocaine 4% T opical Solution Percent of Wound Bed Debrided: 100% T Area Debrided (cm): otal 3.47 Tissue and other  material debrided: Viable, Non-Viable, Eschar, Slough, Subcutaneous, Slough Level: Skin/Subcutaneous Tissue Debridement Description: Excisional Instrument: Curette Bleeding: Minimum Hemostasis Achieved: Silver Nitrate Procedural Pain: 0 Post Procedural Pain: 0 Response to Treatment: Procedure was tolerated well Level of Consciousness (Post- Awake and Alert procedure): Post Debridement Measurements of Total Wound Length: (cm) 2.6 Width: (cm) 1.7 Depth: (cm) 0.1 Volume: (cm) 0.347 Character of Wound/Ulcer Post Debridement: Improved Severity of Tissue Post Debridement: Fat layer exposed Post Procedure Diagnosis Same as Pre-procedure Electronic Signature(Mccoy) Signed: 05/06/2023 12:11:02 PM By: Duanne Guess MD FACS Signed: 05/06/2023 1:05:52 PM By: Zenaida Deed RN, [name] Entered By: Zenaida Deed on 05/06/2023 07:51:24 -------------------------------------------------------------------------------- HPI Details Patient Name: Date of Service: Todd Mccoy. 05/06/2023 9:45 A M Medical Record Number: [id number] Patient Account Number: [id number] Date of Birth/Sex: Treating RN: Sep 02, 1970 (52 y.o. M) Primary Care Provider: [name] Other Clinician: Referring Provider: Treating Provider/Extender: Camillo Flaming Weeks in Treatment: 1 History of Present Illness HPI Description: ADMISSION 11/20/2019; this is a 52 year old man with poorly controlled type 2 diabetes. Roughly 3 to 4 weeks ago he noted swelling of his leg open wounds with draining sores. This happened fairly suddenly. He was seen in the ER on 11/07/2019 noted to have wounds of the right lower extremity x2 weeks at that point. An x-ray of the area was negative one of the wounds was cultured showing MSSA and group B strep. He was noted to have multiple wounds. He was given a course of doxycycline for 7 days white count was 8.4 hemoglobin 9.7. Since then he has been washing the leg and applying  dry gauze. He is here in the clinic for review of this. 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 At that point his culture was MRSA. No no 11/27/19-Patient returns after starting in the clinic for open leg wounds on his right and his right second toe. We have been applying silver alginate and 3 layer compression on the right. Patient completed course of doxycycline. The wounds are looking slightly smaller, the right posterior wound appears to have healed 6/8; patient is here in follow-up for open wounds on his right leg and his right second toe. X-ray I did last time was strangely negative. I gave him a course of Keflex which she is completed the area on the tip of the second toe is  actually close over. All of the wounds on his right anterior lateral and posterior leg are considerably better. There is still open wounds on the right lateral and right posterior but they are measuring better. These were felt to be abscesses for which she was hospitalized in early May. Cultures originally showed MSSA and group B strep 6/25; patient is here for review of wounds on the right lower leg which were felt to be abscesses. He also had an area on his left second toe when he came in this is closed over. Almost all the areas on the right leg are closed 2 small open areas remain. We use silver alginate to these wounds and let them wear his own stockings. Should be closed the next time he is here Readmission 09/08/2021 Mr. 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 Chronic kidney disease stage IV and combined chronic systolic and diastolic heart failure that presents to the clinic  for a 1 month history of open wound to the right lateral foot. He states he visited the ED on 08/18/2021 for this issue and was prescribed doxycycline. He states that the wound became slightly smaller after taking this. He has been using AandE ointment to the wound bed. He denies signs of infection. 3/23; patient presents for follow-up. He had a wound culture at last clinic visit that showed moderate Staph aureus and was started on Augmentin. He has been using gentamicin ointment to the wound bed. He currently denies systemic signs of infection. 3/28; patient presents for follow-up. He has completed his course of antibiotics. He continues to use gentamicin with Hydrofera Blue to the wound bed. He has no issues or complaints today. READMISSION 04/27/2023 ***ABIs 11/2022*** +-------+-----------+-----------+------------+------------+ ABI/TBIT oday'Mccoy ABIT oday'Mccoy TBIPrevious ABIPrevious TBI +-------+-----------+-----------+------------+------------+ Right 1.21 0.98 1.07 0  +-------+-----------+-----------+------------+------------+ Left BKA BKA 1.07   +-------+-----------+-----------+------------+------------+ He returns today with a wound over the lateral aspect of his fifth toe and metatarsal head as well as some smaller wounds on his anterior tibial surface. He has had multiple wounds on the anterior tibial surface that appear to have recently healed. Two remain open. He has home health and they have been applying Medihoney to the sites. 05/06/2023: All of the wounds are smaller today. There is slough and eschar on the anterior tibial wounds and some slough on the lateral foot wound, along with some hypertrophic granulation tissue. Electronic Signature(Mccoy) Signed: 05/06/2023 11:02:02 AM By: Duanne Guess MD FACS Previous Signature: 05/06/2023 10:57:35 AM Version By: Duanne Guess MD FACS Entered By: Duanne Guess on 05/06/2023  08:02:02 -------------------------------------------------------------------------------- Physical Exam Details Patient Name: Date of Service: Todd Mccoy. 05/06/2023 9:45 A M Medical Record Number: [id number] Patient Account Number: [id number] Date of Birth/Sex: Treating RN: 04/12/1971 (52 y.o. M) Primary Care Provider: [name] Other Clinician: Referring Provider: Treating Provider/Extender: Whitney Muse, Odette Horns Weeks in Treatment: 1 Constitutional Slightly hypertensive. . . . no acute distress. Respiratory Normal work of breathing on room air.. Notes 05/06/2023: All of the wounds are smaller today. There is slough and eschar on the anterior tibial wounds and some slough on the lateral foot wound, along with some hypertrophic granulation tissue. Electronic Signature(Mccoy) Signed: 05/06/2023 11:09:31 AM By: Duanne Guess MD FACS Previous Signature: 05/06/2023 11:02:36 AM Version By: Duanne Guess MD FACS Entered By: Duanne Guess on 05/06/2023 08:09:31 Todd Mccoy ([id number]) [id number] Page 5 of 13 --------------------------------------------------------------------------------  Physician Orders Details Patient Name: Date of Service: Todd [name] THA N Mccoy. 05/06/2023 9:45 A M Medical Record Number: [id number] Patient Account Number: [id number] Date of Birth/Sex: Treating RN: [birth year] (52 y.o. Todd Mccoy Primary Care Provider: [name] Other Clinician: Referring Provider: Treating Provider/Extender: Camillo Flaming Weeks in Treatment: 1 The following information was scribed by: Zenaida Deed The information was scribed for: Duanne Guess Verbal / Phone Orders: No Diagnosis Coding ICD-10 Coding Code Description L97.512 Non-pressure chronic ulcer of other part of right foot with fat layer exposed L97.812 Non-pressure chronic ulcer of other part of right lower leg  with fat layer exposed E11.622 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 ulcer E11.621 Type 2 diabetes mellitus with foot ulcer I50.42 Chronic combined systolic (congestive) and diastolic (congestive) heart failure N18.6 End stage renal disease Follow-up Appointments ppointment in 2 weeks. - Dr. Lady Gary RM 1 Return A Friday 11/15 @ 08:45 am Anesthetic (In clinic) Topical Lidocaine 4% applied to wound bed Bathing/ Shower/ Hygiene May shower and wash wound with soap and water. - when changing dressing Home Health New wound care orders this week; continue Home Health for wound care. May utilize formulary equivalent dressing for wound treatment orders unless otherwise specified. Other Home Health Orders/Instructions: - [facility] Wound #10 - Lower Leg Wound Laterality: Right, Anterior Cleanser: Soap and Water 2 x Per Week/30 Days Discharge Instructions: May shower and wash wound with dial antibacterial soap and water prior to dressing change. Cleanser: Wound Cleanser 2 x Per Week/30 Days Discharge Instructions: Cleanse the wound with wound cleanser prior to applying a clean dressing using gauze sponges, not tissue or cotton balls. Peri-Wound Care: Sween Lotion (Moisturizing lotion) 2 x Per Week/30 Days Discharge Instructions: Apply moisturizing lotion as directed Prim Dressing: Maxorb Extra Ag+ Alginate Dressing, 2x2 (in/in) 2 x Per Week/30 Days ary Discharge Instructions: Apply to wound bed as instructed Secondary Dressing: Woven Gauze Sponge, Non-Sterile 4x4 in 2 x Per Week/30 Days Discharge Instructions: Apply over primary dressing as directed. Secured With: 59M Medipore H Soft Cloth Surgical T ape, 4 x 10 (in/yd) 2 x Per Week/30 Days Discharge Instructions: Secure with tape as directed. Compression Wrap: Kerlix Roll 4.5x3.1 (in/yd) 2 x Per Week/30 Days Discharge Instructions: Apply Kerlix and Coban compression as directed. Compression Wrap: Coban Self-Adherent Wrap 4x5  (in/yd) 2 x Per Week/30 Days Discharge Instructions: Apply over Kerlix as directed. Wound #11 - Lower Leg Wound Laterality: Right, Anterior, Proximal Cleanser: Soap and Water 2 x Per Week/30 Days Discharge Instructions: May shower and wash wound with dial antibacterial soap and water prior to dressing change. Todd Mccoy, Todd Mccoy ([id number]) [id number] Page 6 of 13 Cleanser: Wound Cleanser 2 x Per Week/30 Days Discharge Instructions: Cleanse the wound with wound cleanser prior to applying a clean dressing using gauze sponges, not tissue or cotton balls. Peri-Wound Care: Sween Lotion (Moisturizing lotion) 2 x Per Week/30 Days Discharge Instructions: Apply moisturizing lotion as directed Prim Dressing: Maxorb Extra Ag+ Alginate Dressing, 2x2 (in/in) 2 x Per Week/30 Days ary Discharge Instructions: Apply to wound bed as instructed Secondary Dressing: Woven Gauze Sponge, Non-Sterile 4x4 in 2 x Per Week/30 Days Discharge Instructions: Apply over primary dressing as directed. Secured With: 59M Medipore H Soft Cloth Surgical T ape, 4 x 10 (in/yd) 2 x Per Week/30 Days Discharge Instructions: Secure with tape as directed. Compression Wrap: Kerlix Roll 4.5x3.1 (in/yd) 2 x Per Week/30 Days Discharge Instructions: Apply Kerlix and Coban compression as directed.  Compression Wrap: Coban Self-Adherent Wrap 4x5 (in/yd) 2 x Per Week/30 Days Discharge Instructions: Apply over Kerlix as directed. Wound #9 - T Fifth oe Wound Laterality: Right Cleanser: Soap and Water 2 x Per Week/30 Days Discharge Instructions: May shower and wash wound with dial antibacterial soap and water prior to dressing change. Cleanser: Wound Cleanser 2 x Per Week/30 Days Discharge Instructions: Cleanse the wound with wound cleanser prior to applying a clean dressing using gauze sponges, not tissue or cotton balls. Peri-Wound Care: Sween Lotion (Moisturizing lotion) 2 x Per Week/30 Days Discharge  Instructions: Apply moisturizing lotion as directed Prim Dressing: Maxorb Extra Ag+ Alginate Dressing, 2x2 (in/in) 2 x Per Week/30 Days ary Discharge Instructions: Apply to wound bed as instructed Secondary Dressing: Woven Gauze Sponge, Non-Sterile 4x4 in 2 x Per Week/30 Days Discharge Instructions: Apply over primary dressing as directed. Secured With: 68M Medipore H Soft Cloth Surgical T ape, 4 x 10 (in/yd) 2 x Per Week/30 Days Discharge Instructions: Secure with tape as directed. Electronic Signature(Mccoy) Signed: 05/06/2023 12:11:02 PM By: Duanne Guess MD FACS Entered By: Duanne Guess on 05/06/2023 08:11:40 -------------------------------------------------------------------------------- Problem List Details Patient Name: Date of Service: Todd Mccoy. 05/06/2023 9:45 A M Medical Record Number: [id number] Patient Account Number: [id number] Date of Birth/Sex: Treating RN: 05-03-1971 (52 y.o. Todd Mccoy Primary Care Provider: [name] Other Clinician: Referring Provider: Treating Provider/Extender: Camillo Flaming Weeks in Treatment: 1 Active Problems ICD-10 Encounter Code Description Active Date MDM Diagnosis L97.512 Non-pressure chronic ulcer of other part of right foot with fat layer exposed 04/27/2023 No Yes L97.812 Non-pressure chronic ulcer of other part of right lower leg with fat layer 04/27/2023 No Yes exposed Todd Mccoy, Todd Mccoy ([id number]) [phone].pdf Page 7 of 13 E11.622 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 ulcer 04/27/2023 No Yes E11.621 Type 2 diabetes mellitus with foot ulcer 04/27/2023 No Yes I50.42 Chronic combined systolic (congestive) and diastolic (congestive) heart failure 04/27/2023 No Yes N18.6 End stage renal disease 04/27/2023 No Yes Inactive Problems Resolved Problems Electronic Signature(Mccoy) Signed: 05/06/2023 10:55:10 AM By: Duanne Guess MD FACS Previous Signature:  05/06/2023 10:27:11 AM Version By: Duanne Guess MD FACS Entered By: Duanne Guess on 05/06/2023 07:55:10 -------------------------------------------------------------------------------- Progress Note Details Patient Name: Date of Service: Todd Mccoy. 05/06/2023 9:45 A M Medical Record Number: [id number] Patient Account Number: [id number] Date of Birth/Sex: Treating RN: Sep 12, 1970 (52 y.o. M) Primary Care Provider: [name] Other Clinician: Referring Provider: Treating Provider/Extender: Whitney Muse, Odette Horns Weeks in Treatment: 1 Subjective Chief Complaint Information obtained from Patient 11/20/2019; patient is here with for review of multiple wounds over the right lower leg, right great and right second toe 09/08/2021; patient presents for wound to the right lateral foot 04/27/2023: wound on right lateral foot (5th metatarsal head level) and multiple RLE wounds History of Present Illness (HPI) ADMISSION 11/20/2019; this is a 52 year old man with poorly controlled type 2 diabetes. Roughly 3 to 4 weeks ago he noted swelling of his leg open wounds with draining sores. This happened fairly suddenly. He was seen in the ER on 11/07/2019 noted to have wounds of the right lower extremity x2 weeks at that point. An x-ray of the area was negative one of the wounds was cultured showing MSSA and group B strep. He was noted to have multiple wounds. He was given a course of doxycycline for 7 days white count was 8.4 hemoglobin 9.7. Since then he has been washing  the leg and applying dry gauze. He is here in the clinic for review of this. 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 At that point his culture was MRSA. No no 11/27/19-Patient returns after starting in the clinic for open leg wounds on his right and his right second toe.  We have been applying silver alginate and 3 layer compression on the right. Patient completed course of doxycycline. The wounds are looking slightly smaller, the right posterior wound appears to have healed 6/8; patient is here in follow-up for open wounds on his right leg and his right second toe. X-ray I did last time was strangely negative. I gave him a course of Keflex which she is completed the area on the tip of the second toe is actually close over. All of the wounds on his right anterior lateral and posterior leg are considerably better. There is still open wounds on the right lateral and right posterior but they are measuring better. These were felt to be abscesses for which she was hospitalized in early May. Cultures originally showed MSSA and group B strep 6/25; patient is here for review of wounds on the right lower leg which were felt to be abscesses. He also had an area on his left second toe when he came in this is closed over. Almost all the areas on the right leg are closed 2 small open areas remain. We use silver alginate to these wounds and let them wear his own stockings. Should be closed the next time he is here Readmission 09/08/2021 Mr. 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 Chronic kidney disease stage IV and combined chronic systolic and diastolic heart failure that presents to the clinic for a 1 month history of open wound to the right lateral foot. He states he visited the ED on 08/18/2021 for this issue and was prescribed doxycycline. He states that the wound became slightly smaller after taking this. He has been using AandE ointment to the wound bed. He denies signs of infection. 3/23; patient presents for follow-up. He had a wound culture at last clinic visit that showed moderate Staph aureus and was started on Augmentin. He has been Todd Mccoy, Todd Mccoy  ([id number]) [id number] Page 8 of 13 using gentamicin ointment to the wound bed. He currently denies systemic signs of infection. 3/28; patient presents for follow-up. He has completed his course of antibiotics. He continues to use gentamicin with Hydrofera Blue to the wound bed. He has no issues or complaints today. READMISSION 04/27/2023 ***ABIs 11/2022*** +-------+-----------+-----------+------------+------------+ ABI/TBIT oday'Mccoy ABIT oday'Mccoy TBIPrevious ABIPrevious TBI +-------+-----------+-----------+------------+------------+ Right 1.21 0.98 1.07 0  +-------+-----------+-----------+------------+------------+ Left BKA BKA 1.07   +-------+-----------+-----------+------------+------------+ He returns today with a wound over the lateral aspect of his fifth toe and metatarsal head as well as some smaller wounds on his anterior tibial surface. He has had multiple wounds on the anterior tibial surface that appear to have recently healed. Two remain open. He has home health and they have been applying Medihoney to the sites. 05/06/2023: All of the wounds are smaller today. There is slough and eschar on the anterior tibial wounds and some slough on the lateral foot wound, along with some hypertrophic granulation tissue. Patient History Information obtained from Patient, Chart. Family History Diabetes -  Mother, Hypertension - Mother, No family history of Cancer, Heart Disease, Hereditary Spherocytosis, Kidney Disease, Lung Disease, Seizures, Stroke, Thyroid Problems, Tuberculosis. Social History Current every day smoker - 1/2 ppd, Marital Status - Single, Alcohol Use - Never, Drug Use - No History, Caffeine Use - Daily - soda, tea. Medical History Eyes Denies history of Cataracts, Glaucoma, Optic Neuritis Ear/Nose/Mouth/Throat Denies history of Chronic sinus problems/congestion, Middle ear problems Hematologic/Lymphatic Patient has history of  Anemia Denies history of Hemophilia, Human Immunodeficiency Virus, Lymphedema, Sickle Cell Disease Cardiovascular Patient has history of Congestive Heart Failure - EF 40-45%, Hypertension, Peripheral Arterial Disease Denies history of Angina, Arrhythmia, Coronary Artery Disease, Deep Vein Thrombosis, Hypotension, Myocardial Infarction, Peripheral Venous Disease, Phlebitis, Vasculitis Gastrointestinal Denies history of Cirrhosis , Colitis, Crohns, Hepatitis A, Hepatitis B, Hepatitis C Endocrine Patient has history of Type II Diabetes Denies history of Type I Diabetes Genitourinary Patient has history of 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 Immunological Denies history of Lupus Erythematosus, Raynauds, Scleroderma Integumentary (Skin) Denies history of History of Burn Musculoskeletal Patient has history 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 - scrotum exploration. - Left BKA 5/22. 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 no acute distress. Vitals Time Taken: 10:07 AM, Temperature: 97.5 F, Pulse: 72 bpm, Respiratory Rate: 16 breaths/min, Blood Pressure: 140/79 mmHg. Respiratory Normal work of breathing on room air.. General Notes: 05/06/2023: All of the wounds are smaller today. There is slough and  eschar on the anterior tibial wounds and some slough on the lateral foot wound, along with some hypertrophic granulation tissue. Integumentary (Hair, Skin) Wound #10 status is Open. Original cause of wound was Gradually Appeared. The date acquired was: 01/03/2023. The wound has been in treatment 1 weeks. The wound is located on the Right,Anterior Lower Leg. The wound measures 1.1cm length x 0.7cm width x 0.1cm depth; 0.605cm^2 area and 0.06cm^3 volume. There is Fat Layer (Subcutaneous Tissue) exposed. There is no tunneling or undermining noted. There is a medium amount of serosanguineous drainage noted. The wound margin is distinct with the outline attached to the wound base. There is small (1-33%) pink granulation within the wound bed. There is a large (67- 100%) amount of necrotic tissue within the wound bed including Eschar and Adherent Slough. The periwound skin appearance had no abnormalities noted for texture. The periwound skin appearance had no abnormalities noted for moisture. The periwound skin appearance had no abnormalities noted for color. Periwound temperature was noted as No Abnormality. Wound #11 status is Open. Original cause of wound was Gradually Appeared. The date acquired was: 01/03/2023. The wound has been in treatment 1 weeks. The wound is located on the Right,Proximal,Anterior Lower Leg. The wound measures 0.5cm length x 0.5cm width x 0.1cm depth; 0.196cm^2 area and 0.02cm^3 volume. There is Fat Layer (Subcutaneous Tissue) exposed. There is no tunneling or undermining noted. There is a none present amount of drainage noted. The wound margin is distinct with the outline attached to the wound base. There is medium (34-66%) pink granulation within the wound  bed. There is a medium (34- 66%) amount of necrotic tissue within the wound bed including Eschar. The periwound skin appearance had no abnormalities noted for texture. The periwound skin appearance had no abnormalities noted for  moisture. The periwound skin appearance had no abnormalities noted for color. Periwound temperature was noted as No Abnormality. Wound #9 status is Open. Original cause of wound was Gradually Appeared. The date acquired was: 01/03/2023. The wound has been in treatment 1 weeks. The wound is located on the Right T Fifth. The wound measures 2.6cm length x 1.7cm width x 0.1cm depth; 3.471cm^2 area and 0.347cm^3 volume. There is Fat oe Layer (Subcutaneous Tissue) exposed. There is no tunneling or undermining noted. There is a medium amount of serosanguineous drainage noted. The wound margin is distinct with the outline attached to the wound base. There is large (67-100%) red granulation within the wound bed. There is a small (1-33%) amount of necrotic tissue within the wound bed including Adherent Slough. The periwound skin appearance had no abnormalities noted for color. The periwound skin appearance exhibited: Callus, Dry/Scaly. Periwound temperature was noted as No Abnormality. 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 There was a Excisional Skin/Subcutaneous Tissue Debridement with a total area of 0.6 sq cm performed by Duanne Guess, MD. With the following instrument(Mccoy): Curette to remove Viable and Non-Viable tissue/material. Material removed includes Eschar, Subcutaneous Tissue, and Slough after achieving pain control using Lidocaine 4% T opical Solution. No specimens were taken. A time out was conducted at 10:45, prior to the start of  the procedure. A Minimum amount of bleeding was controlled with Pressure. The procedure was tolerated well with a pain level of 0 throughout and a pain level of 0 following the procedure. Post Debridement Measurements: 1.1cm length x 0.7cm width x 0.1cm depth; 0.06cm^3 volume. Character of Wound/Ulcer Post Debridement is improved. Post procedure Diagnosis Wound #10: Same as Pre-Procedure Wound #11 Pre-procedure diagnosis of Wound #11 is a Lymphedema located on the Right,Proximal,Anterior Lower Leg . There was a Excisional Skin/Subcutaneous Tissue Debridement with a total area of 0.2 sq cm performed by Duanne Guess, MD. With the following instrument(Mccoy): Curette to remove Viable and Non-Viable tissue/material. Material removed includes Eschar, Subcutaneous Tissue, and Slough after achieving pain control using Lidocaine 4% T opical Solution. No specimens were taken. A time out was conducted at 10:45, prior to the start of the procedure. A Minimum amount of bleeding was controlled with Pressure. The procedure was tolerated well with a pain level of 0 throughout and a pain level of 0 following the procedure. Post Debridement Measurements: 0.5cm length x 0.5cm width x 0.1cm depth; 0.02cm^3 volume. Character of Wound/Ulcer Post Debridement is improved. Post procedure Diagnosis Wound #11: Same as Pre-Procedure Wound #9 Pre-procedure diagnosis of Wound #9 is a Diabetic Wound/Ulcer of the Lower Extremity located on the Right T Fifth .Severity of Tissue Pre Debridement is: oe Fat layer exposed. There was a Excisional Skin/Subcutaneous Tissue Debridement with a total area of 3.47 sq cm performed by Duanne Guess, MD. With the following instrument(Mccoy): Curette to remove Viable and Non-Viable tissue/material. Material  removed includes Eschar, Subcutaneous Tissue, and Slough after achieving pain control using Lidocaine 4% T opical Solution. No specimens were taken. A time out was conducted at 10:45, prior  to the start of the procedure. A Minimum amount of bleeding was controlled with Silver Nitrate. The procedure was tolerated well with a pain level of 0 throughout and a pain level of 0 following the procedure. Post Debridement Measurements: 2.6cm length x 1.7cm width x 0.1cm depth; 0.347cm^3 volume. Character of Wound/Ulcer Post Debridement is improved. Severity of Tissue Post Debridement is: Fat layer exposed. Post procedure Diagnosis Wound #9: Same as Pre-Procedure Todd Mccoy, Todd Mccoy ([id number]) [phone].pdf Page 10 of 13 Plan Follow-up Appointments: Return Appointment in 2 weeks. - Dr. Lady Gary RM 1 Friday 11/15 @ 08:45 am Anesthetic: (In clinic) Topical Lidocaine 4% applied to wound bed Bathing/ Shower/ Hygiene: May shower and wash wound with soap and water. - when changing dressing Home Health: New wound care orders this week; continue Home Health for wound care. May utilize formulary equivalent dressing for wound treatment orders unless otherwise specified. Other Home Health Orders/Instructions: - [facility] WOUND #10: - Lower Leg Wound Laterality: Right, Anterior Cleanser: Soap and Water 2 x Per Week/30 Days Discharge Instructions: May shower and wash wound with dial antibacterial soap and water prior to dressing change. Cleanser: Wound Cleanser 2 x Per Week/30 Days Discharge Instructions: Cleanse the wound with wound cleanser prior to applying a clean dressing using gauze sponges, not tissue or cotton balls. Peri-Wound Care: Sween Lotion (Moisturizing lotion) 2 x Per Week/30 Days Discharge Instructions: Apply moisturizing lotion as directed Prim Dressing: Maxorb Extra Ag+ Alginate Dressing, 2x2 (in/in) 2 x Per Week/30 Days ary Discharge Instructions: Apply to wound bed as instructed Secondary Dressing: Woven Gauze Sponge, Non-Sterile 4x4 in 2 x Per Week/30 Days Discharge Instructions: Apply over primary dressing as directed. Secured With: 39M Medipore H  Soft Cloth Surgical T ape, 4 x 10 (in/yd) 2 x Per Week/30 Days Discharge Instructions: Secure with tape as directed. Com pression Wrap: Kerlix Roll 4.5x3.1 (in/yd) 2 x Per Week/30 Days Discharge Instructions: Apply Kerlix and Coban compression as directed. Com pression Wrap: Coban Self-Adherent Wrap 4x5 (in/yd) 2 x Per Week/30 Days Discharge Instructions: Apply over Kerlix as directed. WOUND #11: - Lower Leg Wound Laterality: Right, Anterior, Proximal Cleanser: Soap and Water 2 x Per Week/30 Days Discharge Instructions: May shower and wash wound with dial antibacterial soap and water prior to dressing change. Cleanser: Wound Cleanser 2 x Per Week/30 Days Discharge Instructions: Cleanse the wound with wound cleanser prior to applying a clean dressing using gauze sponges, not tissue or cotton balls. Peri-Wound Care: Sween Lotion (Moisturizing lotion) 2 x Per Week/30 Days Discharge Instructions: Apply moisturizing lotion as directed Prim Dressing: Maxorb Extra Ag+ Alginate Dressing, 2x2 (in/in) 2 x Per Week/30 Days ary Discharge Instructions: Apply to wound bed as instructed Secondary Dressing: Woven Gauze Sponge, Non-Sterile 4x4 in 2 x Per Week/30 Days Discharge Instructions: Apply over primary dressing as directed. Secured With: 39M Medipore H Soft Cloth Surgical T ape, 4 x 10 (in/yd) 2 x Per Week/30 Days Discharge Instructions: Secure with tape as directed. Com pression Wrap: Kerlix Roll 4.5x3.1 (in/yd) 2 x Per Week/30 Days Discharge Instructions: Apply Kerlix and Coban compression as directed. Com pression Wrap: Coban Self-Adherent Wrap 4x5 (in/yd) 2 x Per Week/30 Days Discharge Instructions: Apply over Kerlix as directed. WOUND #9: - T Fifth Wound Laterality: Right oe Cleanser: Soap and Water 2 x Per Week/30 Days Discharge Instructions: May  shower and wash wound with dial antibacterial soap and water prior to dressing change. Cleanser: Wound Cleanser 2 x Per Week/30 Days Discharge  Instructions: Cleanse the wound with wound cleanser prior to applying a clean dressing using gauze sponges, not tissue or cotton balls. Peri-Wound Care: Sween Lotion (Moisturizing lotion) 2 x Per Week/30 Days Discharge Instructions: Apply moisturizing lotion as directed Prim Dressing: Maxorb Extra Ag+ Alginate Dressing, 2x2 (in/in) 2 x Per Week/30 Days ary Discharge Instructions: Apply to wound bed as instructed Secondary Dressing: Woven Gauze Sponge, Non-Sterile 4x4 in 2 x Per Week/30 Days Discharge Instructions: Apply over primary dressing as directed. Secured With: 75M Medipore H Soft Cloth Surgical T ape, 4 x 10 (in/yd) 2 x Per Week/30 Days Discharge Instructions: Secure with tape as directed. 05/06/2023: All of the wounds are smaller today. There is slough and eschar on the anterior tibial wounds and some slough on the lateral foot wound, along with some hypertrophic granulation tissue. I used a curette to debride slough, eschar, and subcutaneous tissue from the anterior tibial wounds and slough and subcutaneous tissue from the lateral foot wound. I then chemically cauterized the hypertrophic granulation tissue with silver nitrate. We will continue silver alginate to all wounds with Kerlix and Coban wrap. Due to clinic scheduling, he will follow-up in 2 weeks; he does have home health can perform the dressing change. Electronic Signature(Mccoy) Signed: 05/06/2023 11:12:47 AM By: Duanne Guess MD FACS Entered By: Duanne Guess on 05/06/2023 08:12:46 Todd Mccoy ([id number]) [id number] Page 11 of 13 -------------------------------------------------------------------------------- HxROS Details Patient Name: Date of Service: Todd [name] THA N Mccoy. 05/06/2023 9:45 A M Medical Record Number: [id number] Patient Account Number: [id number] Date of Birth/Sex: Treating RN: 1970/08/09 (52 y.o. 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 Ear/Nose/Mouth/Throat Medical History: Negative for: Chronic sinus problems/congestion; Middle ear problems Hematologic/Lymphatic Medical History: Positive for: Anemia Negative for: Hemophilia; Human Immunodeficiency Virus; Lymphedema; Sickle Cell Disease Cardiovascular Medical History: Positive for: Congestive Heart Failure - EF 40-45%; Hypertension; Peripheral Arterial Disease Negative for: Angina; Arrhythmia; Coronary Artery Disease; Deep Vein Thrombosis; Hypotension; Myocardial Infarction; Peripheral Venous Disease; Phlebitis; Vasculitis Gastrointestinal Medical History: Negative for: Cirrhosis ; Colitis; Crohns; Hepatitis A; Hepatitis B; Hepatitis C Endocrine Medical History: Positive for: Type II Diabetes Negative for: Type I Diabetes Time with diabetes: 15 yrs Treated with: Insulin Blood sugar tested every day: Yes Tested : daily Blood sugar testing results: Breakfast: 170 Genitourinary Medical History: Positive for: 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 Past Medical History Notes: on dialysis Immunological Medical History: Negative for: Lupus Erythematosus; Raynauds; Scleroderma Todd Mccoy, Todd Mccoy ([id number]) [id number] Page 12 of 13 Integumentary (Skin) Medical History: Negative for: History of Burn Musculoskeletal Medical History: Positive for: 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 Negative for: Gout; Rheumatoid Arthritis; Osteoarthritis Neurologic Medical  History: Positive for: Neuropathy Negative for: Dementia; Quadriplegia; Paraplegia; Seizure Disorder Oncologic Medical History: Negative for: Received Chemotherapy; Received Radiation Psychiatric Medical History: Negative for: Anorexia/bulimia; Confinement Anxiety Immunizations Pneumococcal Vaccine: Received Pneumococcal Vaccination: No Implantable Devices None Hospitalization / Surgery History Type of Hospitalization/Surgery IandD abscess right flank scrotum exploration Left BKA 5/22 Family and Social History Cancer:  No; Diabetes: Yes - Mother; Heart Disease: No; Hereditary Spherocytosis: No; Hypertension: Yes - Mother; Kidney Disease: No; Lung Disease: No; Seizures: No; Stroke: No; Thyroid Problems: No; Tuberculosis: No; Current every day smoker - 1/2 ppd; Marital Status - Single; Alcohol Use: Never; Drug Use: No History; Caffeine Use: Daily - soda, tea; Financial Concerns: No; Food, Clothing or Shelter Needs: No; Support System Lacking: No; Transportation Concerns: No Electronic Signature(Mccoy) Signed: 05/06/2023 12:11:02 PM By: Duanne Guess MD FACS Entered By: Duanne Guess on 05/06/2023 08:02:10 -------------------------------------------------------------------------------- SuperBill Details Patient Name: Date of Service: Todd Mccoy. 05/06/2023 Medical Record Number: [id number] Patient Account Number: [id number] Date of Birth/Sex: Treating RN: May 04, 1971 (52 y.o. M) Primary Care Provider: [name] Other Clinician: Referring Provider: Treating Provider/Extender: Camillo Flaming Weeks in Treatment: 1 Diagnosis Coding ICD-10 Codes Code Description [phone] Non-pressure chronic ulcer of other part of right foot with fat layer exposed L97.812 Non-pressure chronic ulcer of other part of right lower leg with fat layer exposed E11.622 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 ulcer Todd Mccoy, Todd Mccoy ([id number])  [phone].pdf Page 13 of 13 E11.621 Type 2 diabetes mellitus with foot ulcer I50.42 Chronic combined systolic (congestive) and diastolic (congestive) heart failure N18.6 End stage renal disease Facility Procedures : CPT4 Code: [id number] Description: 11042 - DEB SUBQ TISSUE 20 SQ CM/< ICD-10 Diagnosis Description L97.512 Non-pressure chronic ulcer of other part of right foot with fat layer exposed L97.812 Non-pressure chronic ulcer of other part of right lower leg with fat layer exp Modifier: osed Quantity: 1 Physician Procedures : CPT4 Code Description Modifier [id number] - WC PHYS LEVEL 3 - EST PT ICD-10 Diagnosis Description L97.512 Non-pressure chronic ulcer of other part of right foot with fat layer exposed L97.812 Non-pressure chronic ulcer of other part of right lower  leg with fat layer exposed E11.622 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 ulcer E11.621 Type 2 diabetes mellitus with foot ulcer Quantity: 1 : [id number] 11042 - WC PHYS SUBQ TISS 20 SQ CM ICD-10 Diagnosis Description L97.512 Non-pressure chronic ulcer of other part of right foot with fat layer exposed L97.812 Non-pressure chronic ulcer of other part of right lower leg with fat layer exposed Quantity: 1 Electronic Signature(Mccoy) Signed: 05/06/2023 11:13:15 AM By: Duanne Guess MD FACS Entered By: Duanne Guess on 05/06/2023 08:13:15

## 2023-05-09 ENCOUNTER — Other Ambulatory Visit: Payer: Self-pay | Admitting: Cardiology

## 2023-05-09 ENCOUNTER — Other Ambulatory Visit: Payer: Self-pay

## 2023-05-10 ENCOUNTER — Telehealth: Payer: Self-pay

## 2023-05-10 NOTE — Telephone Encounter (Signed)
Attempted to reach pt to schedule his AVF surgery. Left VM for him to call us back.

## 2023-05-13 ENCOUNTER — Other Ambulatory Visit: Payer: Self-pay

## 2023-05-13 DIAGNOSIS — N186 End stage renal disease: Secondary | ICD-10-CM

## 2023-05-15 IMAGING — US US RENAL
1 series · 14 of 25 positions shown · non-contrast
Comparison: CT abdomen pelvis 09/07/2014

CLINICAL DATA: Acute renal injury.

EXAM:
RENAL / URINARY TRACT ULTRASOUND COMPLETE

[Series 1: us renal · 14 of 41 slices shown]
[im 1/41]
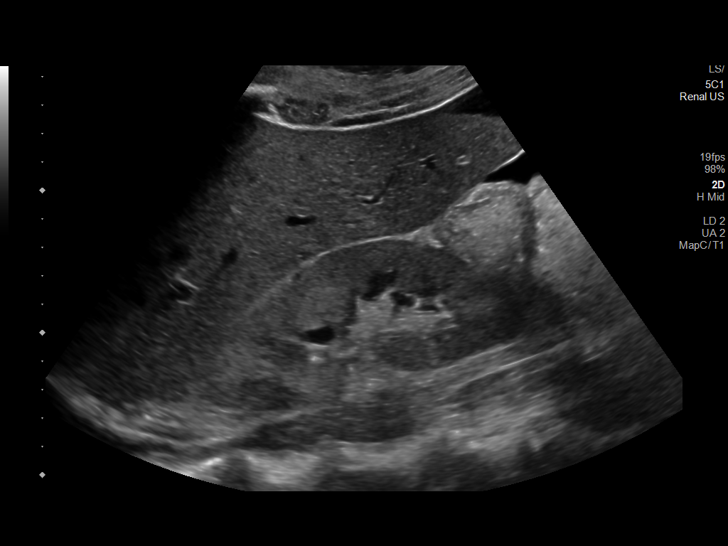
[im 4/41]
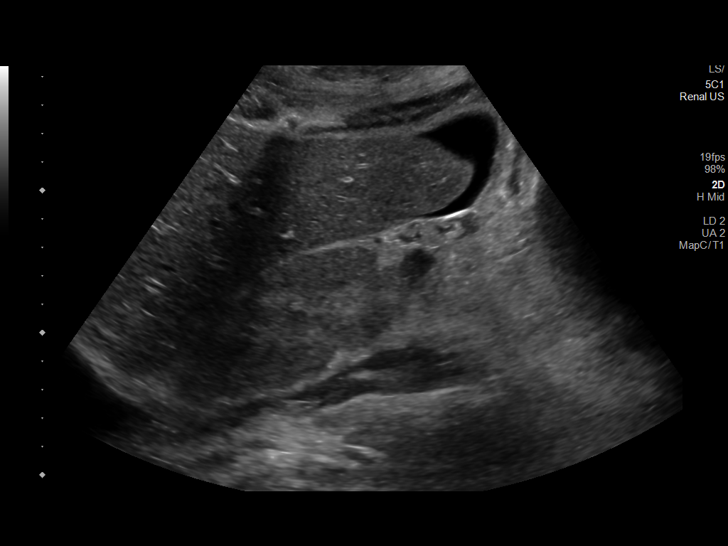
[im 7/41]
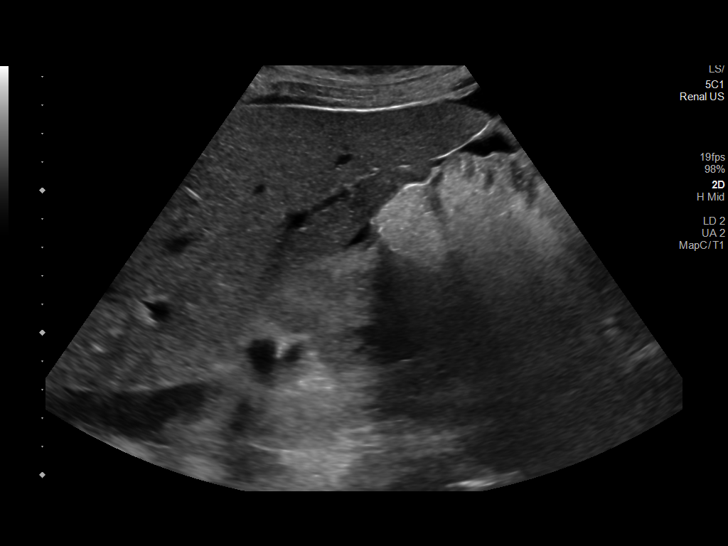
[im 11/41]
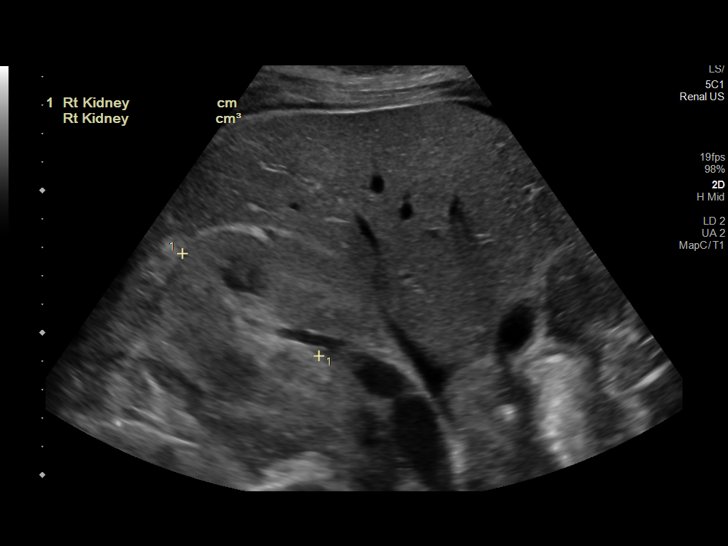
[im 14/41]
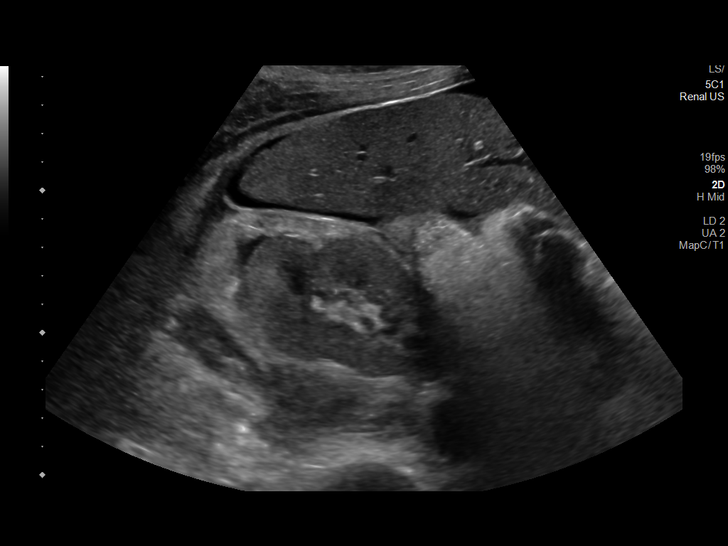
[im 16/41]
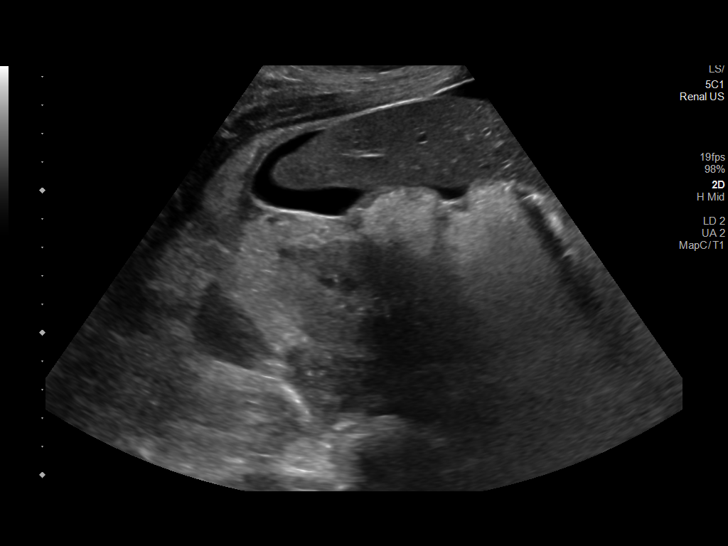
[im 19/41]
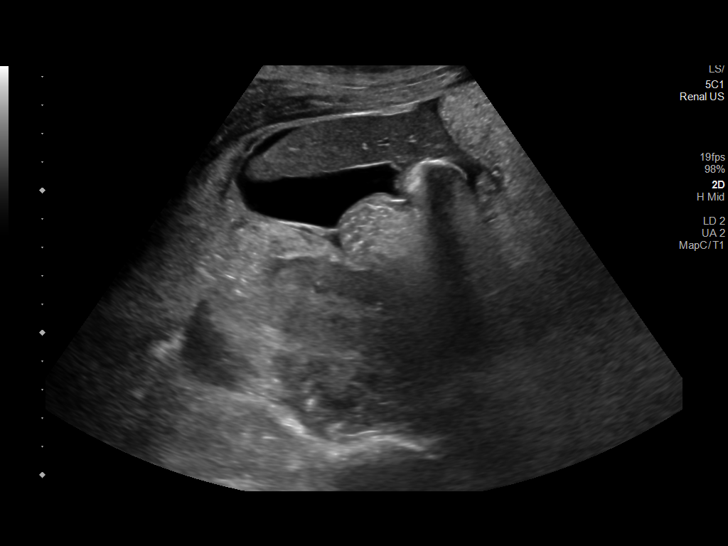
[im 22/41]
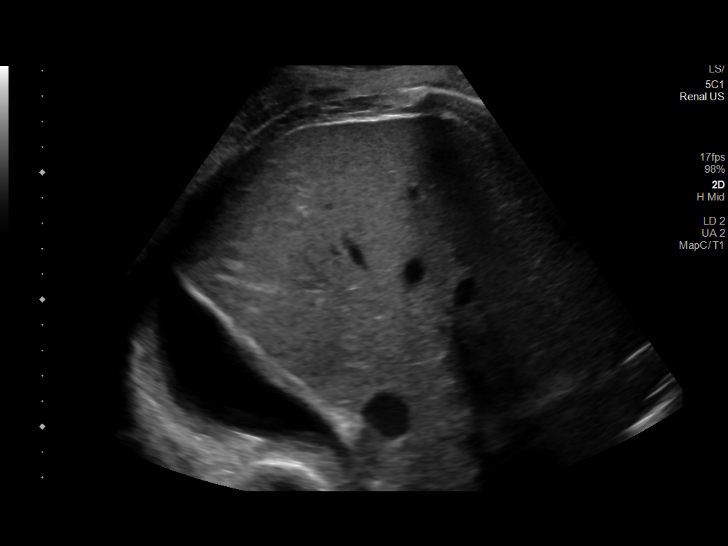
[im 26/41]
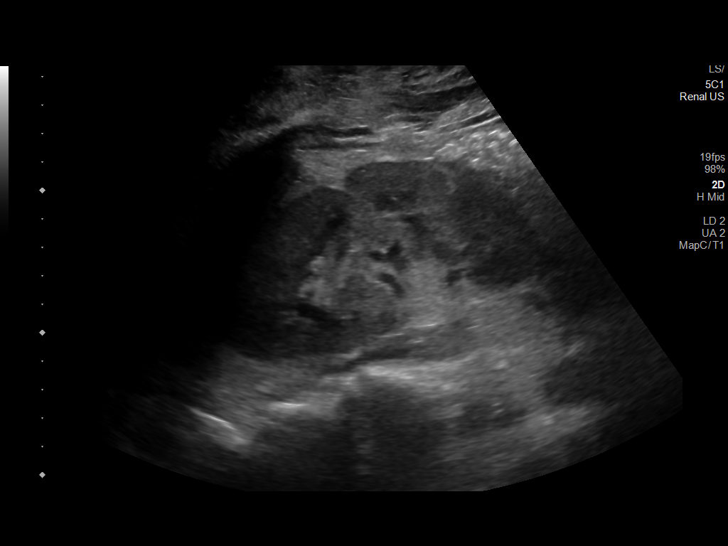
[im 27/41]
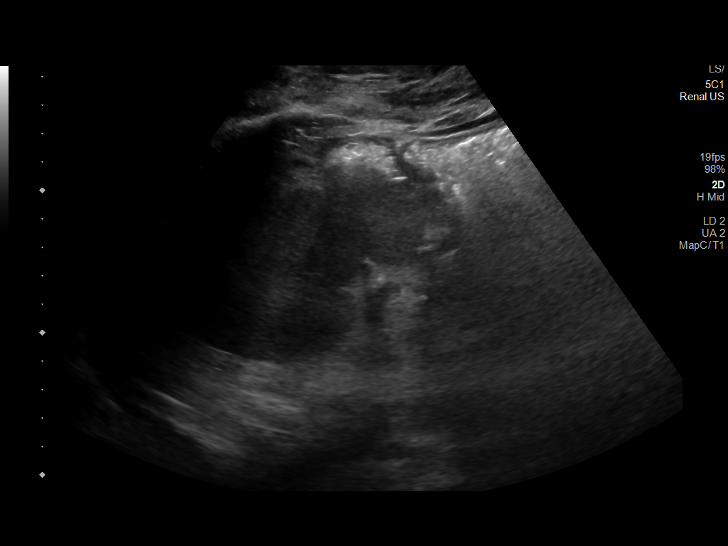
[im 31/41]
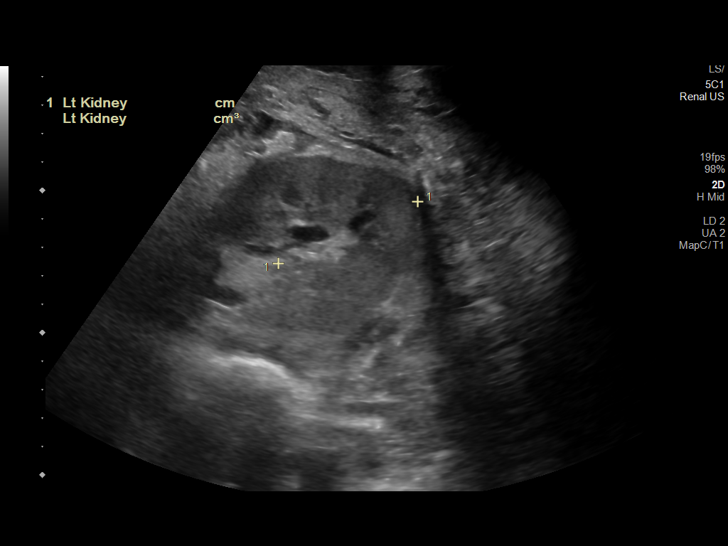
[im 34/41]
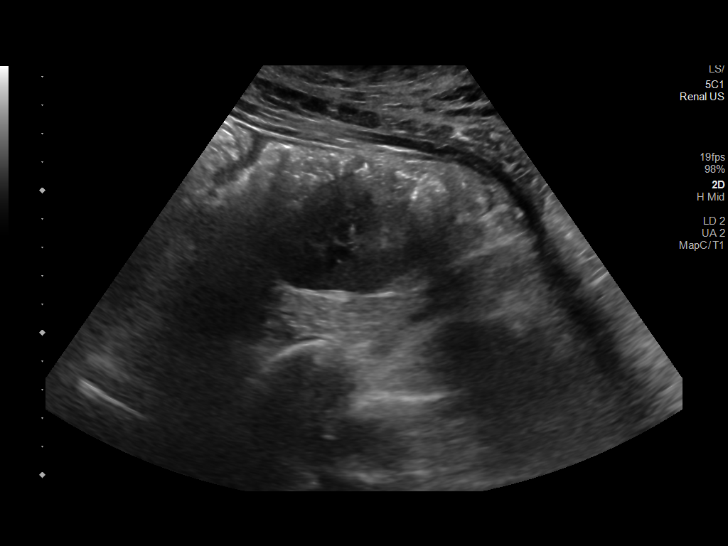
[im 37/41]
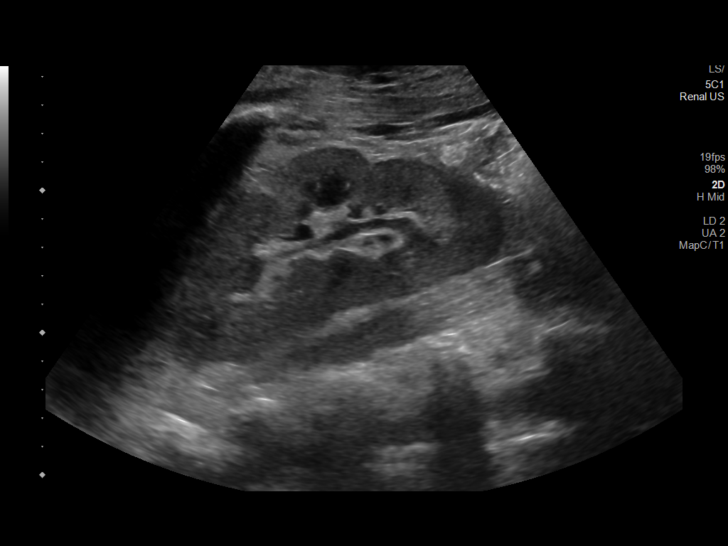
[im 41/41]
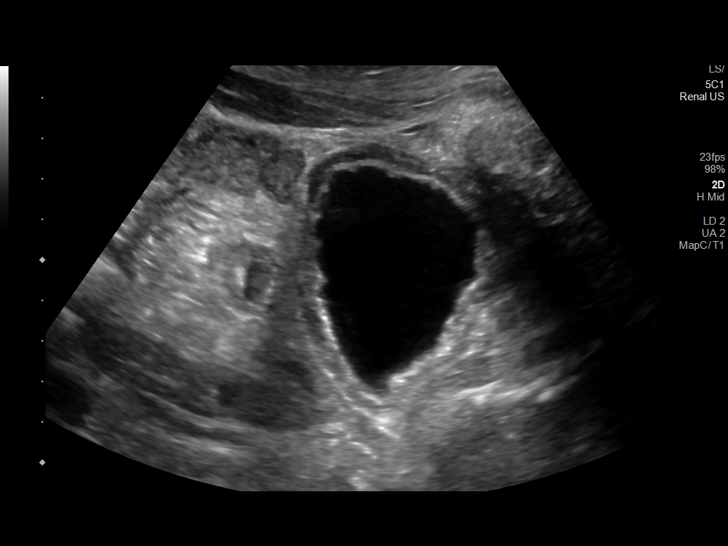

[14 of 25 positions shown; findings below may reference images not displayed]

FINDINGS: Right Kidney:

Renal measurements: 12.6 x 5 x 6 cm = volume: 198 mL. Echogenicity
within normal limits. No mass or hydronephrosis visualized.

Left Kidney:

Renal measurements: 12.8 x 5.3 x 5.4 cm = volume: 189 mL.
Echogenicity within normal limits. No mass or hydronephrosis
visualized.

Urinary bladder:

Thickened bladder wall. Otherwise appears normal for degree of
bladder distention.

Other:

At least small volume ascites.  Right pleural effusion.
IMPRESSION: 1. Thickened urinary bladder wall. Correlate with urinalysis for
infection.
2. At least small volume ascites.
3. Right pleural effusion.

## 2023-05-15 IMAGING — DX DG CHEST 1V PORT
1 series · 1 of 1 positions shown · non-contrast
Comparison: 12/10/2020.

CLINICAL DATA: Shortness of breath.

EXAM:
PORTABLE CHEST 1 VIEW

[chest ap]
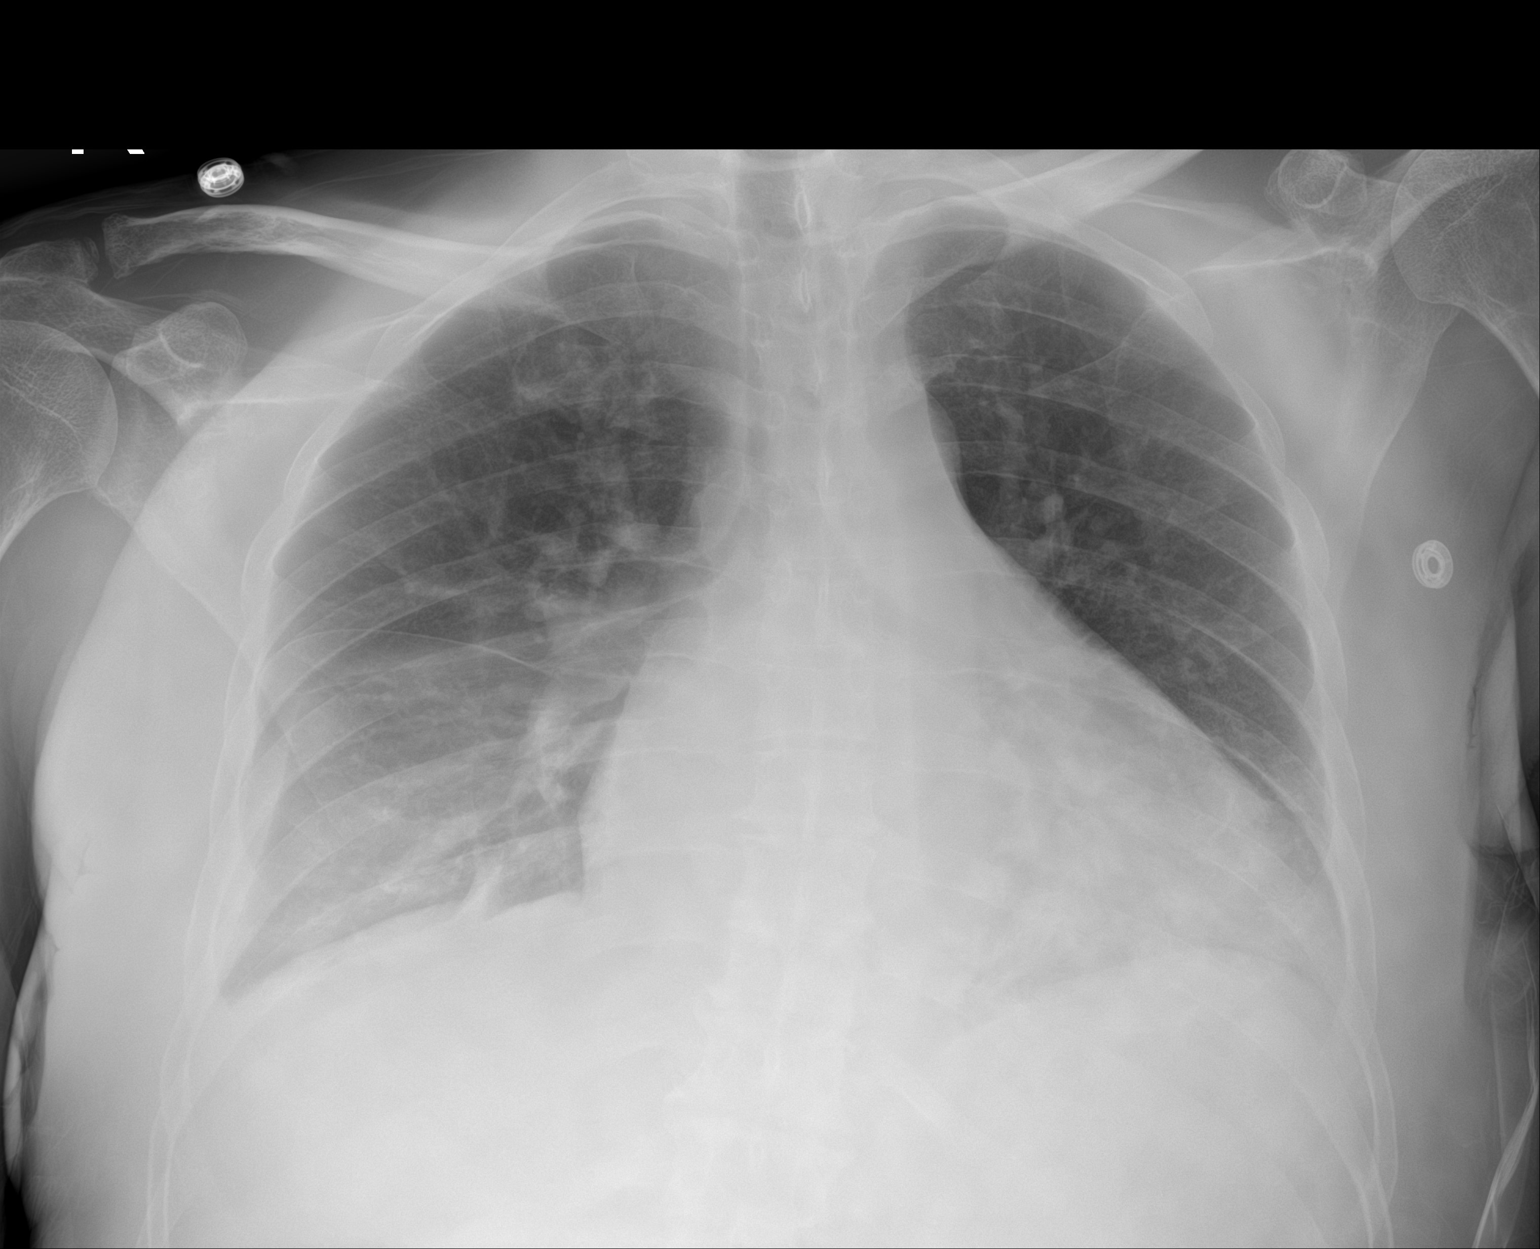

[1 of 1 positions shown; findings below may reference images not displayed]

FINDINGS: Cardiomegaly with mild pulmonary venous congestion. Low lung volumes
with bibasilar atelectasis. Bibasilar infiltrates/edema cannot be
excluded. Small right pleural effusion. No pneumothorax.
IMPRESSION: Cardiomegaly with mild pulmonary venous congestion.

2. Low lung volumes with bibasilar atelectasis. Bibasilar
infiltrates/edema cannot be excluded. Small right pleural effusion.

## 2023-05-16 ENCOUNTER — Other Ambulatory Visit: Payer: Self-pay | Admitting: Nurse Practitioner

## 2023-05-16 ENCOUNTER — Other Ambulatory Visit: Payer: Self-pay | Admitting: Cardiology

## 2023-05-16 DIAGNOSIS — E11628 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 complications: Secondary | ICD-10-CM

## 2023-05-18 NOTE — Progress Notes (Signed)
Todd Mccoy, Todd Mccoy ([id number]) [id number] Page 1 of 10 Visit Report for 05/06/2023 Arrival Information Details Patient Name: Date of Service: Todd Mccoy. 05/06/2023 9:45 A M Medical Record Number: [id number] Patient Account Number: [id number] Date of Birth/Sex: Treating RN: Nov 22, 1970 (52 y.o. M) Primary Care Lenzi Marmo: [name] Other Clinician: Referring Adeline Petitfrere: Treating Todd Mccoy/Extender: Todd Mccoy Weeks in Treatment: 1 Visit Information History Since Last Visit Added or deleted any medications: No Patient Arrived: Wheel Chair Any new allergies or adverse reactions: No Arrival Time: 10:00 Had a fall or experienced change in No Accompanied By: nephew activities of daily living that may affect Transfer Assistance: None risk of falls: Patient Identification Verified: Yes Signs or symptoms of abuse/neglect since last visito No Secondary Verification Process Completed: Yes Hospitalized since last visit: No Implantable device outside of the clinic excluding No cellular tissue based products placed in the center since last visit: Has Dressing in Place as Prescribed: Yes Pain Present Now: Yes Electronic Signature(Mccoy) Signed: 05/18/2023 11:11:27 AM By: Todd Mccoy Entered By: Todd Mccoy on 05/06/2023 07:07:46 -------------------------------------------------------------------------------- Encounter Discharge Information Details Patient Name: Date of Service: Todd Mccoy. 05/06/2023 9:45 A M Medical Record Number: [id number] Patient Account Number: [id number] Date of Birth/Sex: Treating RN: Apr 04, 1971 (52 y.o. Todd Mccoy Primary Care Balen Woolum: [name] Other Clinician: Referring Randal Goens: Treating Cartel Mauss/Extender: Todd Mccoy Weeks in Treatment: 1 Encounter Discharge Information Items Post Procedure Vitals Discharge Condition: Stable Temperature  (F): 97.5 Ambulatory Status: Wheelchair Pulse (bpm): 72 Discharge Destination: Home Respiratory Rate (breaths/min): 16 Transportation: Private Auto Blood Pressure (mmHg): 140/79 Accompanied By: nephew Schedule Follow-up Appointment: Yes Clinical Summary of Care: Patient Declined Electronic Signature(Mccoy) Signed: 05/06/2023 11:58:07 AM By: Samuella Bruin Entered By: Samuella Bruin on 05/06/2023 08:05:45 Todd Mccoy ([id number]) [id number] Page 2 of 10 -------------------------------------------------------------------------------- Lower Extremity Assessment Details Patient Name: Date of Service: Todd [name] [place] THA N Mccoy. 05/06/2023 9:45 A M Medical Record Number: [id number] Patient Account Number: [id number] Date of Birth/Sex: Treating RN: 1970-07-22 (52 y.o. Todd Mccoy Primary Care Demetrius Mahler: [name] Other Clinician: Referring Todd Mccoy: Treating Carolee Channell/Extender: Todd Mccoy Weeks in Treatment: 1 Edema Assessment Assessed: [Left: No] [Right: No] [Left: Edema] [Right: :] Calf Left: Right: Point of Measurement: From Medial Instep 39.9 cm Ankle Left: Right: Point of Measurement: From Medial Instep 28 cm Vascular Assessment Pulses: Dorsalis Pedis Palpable: [Right:Yes] Extremity colors, hair growth, and conditions: Extremity Color: [Right:Normal] Hair Growth on Extremity: [Right:No] Temperature of Extremity: [Right:Warm] Capillary Refill: [Right:< 3 seconds] Dependent Rubor: [Right:No No] Electronic Signature(Mccoy) Signed: 05/06/2023 12:02:50 PM By: Karie Schwalbe RN Entered By: Karie Schwalbe on 05/06/2023 07:28:00 -------------------------------------------------------------------------------- Multi Wound Chart Details Patient Name: Date of Service: Todd Mccoy. 05/06/2023 9:45 A M Medical Record Number: [id number] Patient Account Number: [id number] Date of Birth/Sex: Treating  RN: May 02, 1971 (52 y.o. M) Primary Care Nina Mondor: [name] Other Clinician: Referring Todd Mccoy: Treating Tauni Sanks/Extender: Todd Mccoy Weeks in Treatment: 1 Vital Signs Height(in): Pulse(bpm): 72 Weight(lbs): Blood Pressure(mmHg): 140/79 Body Mass Index(BMI): Temperature(F): 97.5 Respiratory Rate(breaths/min): 16 [10:Photos:] [phone].pdf Page 3 of 10] Right, Anterior Lower Leg Right, Proximal, Anterior Lower Leg Right T Fifth oe Wound Location: Gradually Appeared Gradually Appeared Gradually Appeared Wounding Event: Lymphedema Lymphedema Diabetic Wound/Ulcer of the Lower Primary Etiology: Extremity Anemia, Congestive Heart Failure, Anemia, Congestive Heart Failure, Anemia, Congestive Heart Failure, Comorbid History: Hypertension, Peripheral Arterial Hypertension, Peripheral Arterial Hypertension,  Peripheral Arterial Disease, Type II Diabetes, End Stage Disease, Type II Diabetes, End Stage Disease, Type II Diabetes, End Stage Renal Disease, Osteomyelitis, Renal Disease, Osteomyelitis, Renal Disease, Osteomyelitis, Neuropathy Neuropathy Neuropathy 01/03/2023 01/03/2023 01/03/2023 Date Acquired: 1 1 1  Weeks of Treatment: Open Open Open Wound Status: No No No Wound Recurrence: 1.1x0.7x0.1 0.5x0.5x0.1 2.6x1.7x0.1 Measurements L x W x D (cm) 0.605 0.196 3.471 A (cm) : rea 0.06 0.02 0.347 Volume (cm) : 38.90% -148.10% 44.80% % Reduction in A rea: 39.40% -150.00% 44.70% % Reduction in Volume: Full Thickness Without Exposed Full Thickness Without Exposed Grade 1 Classification: Support Structures Support Structures Medium None Present Medium Exudate A mount: Serosanguineous N/A Serosanguineous Exudate Type: red, brown N/A red, brown Exudate Color: Distinct, outline attached Distinct, outline attached Distinct, outline attached Wound Margin: Small (1-33%) Medium (34-66%) Large (67-100%) Granulation A mount: Pink  Pink Red Granulation Quality: Large (67-100%) Medium (34-66%) Small (1-33%) Necrotic A mount: Eschar, Adherent Slough Eschar Adherent Slough Necrotic Tissue: Fat Layer (Subcutaneous Tissue): Yes Fat Layer (Subcutaneous Tissue): Yes Fat Layer (Subcutaneous Tissue): Yes Exposed Structures: Fascia: No Fascia: No Fascia: No Tendon: No Tendon: No Tendon: No Muscle: No Muscle: No Muscle: No Joint: No Joint: No Joint: No Bone: No Bone: No Bone: No Small (1-33%) Small (1-33%) Small (1-33%) Epithelialization: Debridement - Excisional Debridement - Excisional Debridement - Excisional Debridement: Pre-procedure Verification/Time Out 10:45 10:45 10:45 Taken: Lidocaine 4% Topical Solution Lidocaine 4% Topical Solution Lidocaine 4% T opical Solution Pain Control: Necrotic/Eschar, Subcutaneous, Necrotic/Eschar, Subcutaneous, Necrotic/Eschar, Subcutaneous, Tissue Debrided: [employer] Skin/Subcutaneous Tissue Skin/Subcutaneous Tissue Skin/Subcutaneous Tissue Level: 0.6 0.2 3.47 Debridement A (sq cm): rea Curette Curette Curette Instrument: Minimum Minimum Minimum Bleeding: Pressure Pressure Silver Nitrate Hemostasis Achieved: 0 0 0 Procedural Pain: 0 0 0 Post Procedural Pain: Debridement Treatment Response: Procedure was tolerated well Procedure was tolerated well Procedure was tolerated well Post Debridement Measurements L x 1.1x0.7x0.1 0.5x0.5x0.1 2.6x1.7x0.1 W x D (cm) 0.06 0.02 0.347 Post Debridement Volume: (cm) No Abnormalities Noted No Abnormalities Noted Callus: Yes Periwound Skin Texture: No Abnormalities Noted No Abnormalities Noted Dry/Scaly: Yes Periwound Skin Moisture: No Abnormalities Noted No Abnormalities Noted No Abnormalities Noted Periwound Skin Color: No Abnormality No Abnormality No Abnormality Temperature: Debridement Debridement Debridement Procedures Performed: Treatment Notes Electronic Signature(Mccoy) Signed: 05/06/2023 10:56:41 AM By:  Duanne Guess MD FACS Entered By: Duanne Guess on 05/06/2023 07:56:41 Multi-Disciplinary Care Plan Details -------------------------------------------------------------------------------- Todd Mccoy ([id number]) [id number] Page 4 of 10 Patient Name: Date of Service: Todd [name] [place] THA N Mccoy. 05/06/2023 9:45 A M Medical Record Number: [id number] Patient Account Number: [id number] Date of Birth/Sex: Treating RN: 03-26-1971 (52 y.o. Todd Mccoy Primary Care Shontay Wallner: [name] Other Clinician: Referring Aleksis Jiggetts: Treating Evi Mccomb/Extender: Todd Mccoy Weeks in Treatment: 1 Multidisciplinary Care Plan reviewed with physician Active Inactive Necrotic Tissue Nursing Diagnoses: Impaired tissue integrity related to necrotic/devitalized tissue Knowledge deficit related to management of necrotic/devitalized tissue Goals: Necrotic/devitalized tissue will be minimized in the wound bed Date Initiated: 04/27/2023 Target Resolution Date: 06/10/2023 Goal Status: Active Patient/caregiver will verbalize understanding of reason and process for debridement of necrotic tissue Date Initiated: 04/27/2023 Target Resolution Date: 06/10/2023 Goal Status: Active Interventions: Assess patient pain level pre-, during and post procedure and prior to discharge Provide education on necrotic tissue and debridement process Treatment Activities: Apply topical anesthetic as ordered : 04/27/2023 Notes: Wound/Skin Impairment Nursing Diagnoses: Impaired tissue integrity Knowledge deficit related to smoking impact on wound healing Knowledge deficit related to ulceration/compromised skin integrity  Goals: Patient will demonstrate a reduced rate of smoking or cessation of smoking Date Initiated: 04/27/2023 Target Resolution Date: 06/10/2023 Goal Status: Active Patient/caregiver will verbalize understanding of skin care regimen Date  Initiated: 04/27/2023 Target Resolution Date: 06/10/2023 Goal Status: Active Interventions: Assess ulceration(Mccoy) every visit Provide education on smoking Treatment Activities: Skin care regimen initiated : 04/27/2023 Topical wound management initiated : 04/27/2023 Notes: Electronic Signature(Mccoy) Signed: 05/06/2023 1:05:52 PM By: Zenaida Deed RN, [name] Entered By: Zenaida Deed on 05/06/2023 07:23:38 -------------------------------------------------------------------------------- Pain Assessment Details Patient Name: Date of Service: Todd Mccoy. 05/06/2023 9:45 A M Medical Record Number: [id number] Patient Account Number: [id number] KORVER, GALDI ([id number]) [id number] Page 5 of 10 Date of Birth/Sex: Treating RN: 03-16-1971 (52 y.o. M) Primary Care Alena Blankenbeckler: [name] Other Clinician: Referring Todd Mccoy: Treating Roxana Lai/Extender: Todd Mccoy Weeks in Treatment: 1 Active Problems Location of Pain Severity and Description of Pain Patient Has Paino Yes Site Locations Rate the pain. Current Pain Level: 5 Pain Management and Medication Current Pain Management: Electronic Signature(Mccoy) Signed: 05/18/2023 11:11:27 AM By: Todd Mccoy Entered By: Todd Mccoy on 05/06/2023 07:09:41 -------------------------------------------------------------------------------- Patient/Caregiver Education Details Patient Name: Date of Service: Todd Mccoy. 11/1/2024andnbsp9:45 A M Medical Record Number: [id number] Patient Account Number: [id number] Date of Birth/Gender: Treating RN: [birth year] (52 y.o. Todd Mccoy Primary Care Physician: [name] Other Clinician: Referring Physician: Treating Physician/Extender: Todd Mccoy Weeks in Treatment: 1 Education Assessment Education Provided To: Patient Education Topics Provided Elevated Blood Sugar/ Impact on  Healing: Methods: Explain/Verbal Responses: Reinforcements needed Wound/Skin Impairment: Methods: Explain/Verbal Responses: Reinforcements needed, State content correctly Electronic Signature(Mccoy) Signed: 05/06/2023 1:05:52 PM By: Zenaida Deed RN, [name] Entered By: Zenaida Deed on 05/06/2023 07:24:30 Todd Mccoy ([id number]) [id number] Page 6 of 10 -------------------------------------------------------------------------------- Wound Assessment Details Patient Name: Date of Service: Todd Mccoy. 05/06/2023 9:45 A M Medical Record Number: [id number] Patient Account Number: [id number] Date of Birth/Sex: Treating RN: 1971-01-05 (52 y.o. M) Primary Care Victorine Mcnee: [name] Other Clinician: Referring Elek Holderness: Treating Breland Trouten/Extender: Todd Mccoy Weeks in Treatment: 1 Wound Status Wound Number: 10 Primary Lymphedema Etiology: Wound Location: Right, Anterior Lower Leg Wound Open Wounding Event: Gradually Appeared Status: Date Acquired: 01/03/2023 Comorbid Anemia, Congestive Heart Failure, Hypertension, Peripheral Arterial Weeks Of Treatment: 1 History: Disease, Type II Diabetes, End Stage Renal Disease, Clustered Wound: No Osteomyelitis, Neuropathy Photos Wound Measurements Length: (cm) 1.1 Width: (cm) 0.7 Depth: (cm) 0.1 Area: (cm) 0.605 Volume: (cm) 0.06 % Reduction in Area: 38.9% % Reduction in Volume: 39.4% Epithelialization: Small (1-33%) Tunneling: No Undermining: No Wound Description Classification: Full Thickness Without Exposed Support Wound Margin: Distinct, outline attached Exudate Amount: Medium Exudate Type: Serosanguineous Exudate Color: red, brown Structures Foul Odor After Cleansing: No Slough/Fibrino Yes Wound Bed Granulation Amount: Small (1-33%) Exposed Structure Granulation Quality: Pink Fascia Exposed: No Necrotic Amount: Large (67-100%) Fat Layer (Subcutaneous Tissue)  Exposed: Yes Necrotic Quality: Eschar, Adherent Slough Tendon Exposed: No Muscle Exposed: No Joint Exposed: No Bone Exposed: No Periwound Skin Texture Texture Color No Abnormalities Noted: Yes No Abnormalities Noted: Yes Moisture Temperature / Pain No Abnormalities Noted: Yes Temperature: No Abnormality Treatment Notes Wound #10 (Lower Leg) Wound Laterality: Right, Anterior Cleanser REAVIS, TEWS ([id number]) [id number] Page 7 of 10 Soap and Water Discharge Instruction: May shower and wash wound with dial antibacterial soap and water prior to dressing change. Wound Cleanser Discharge Instruction: Cleanse the wound with wound  cleanser prior to applying a clean dressing using gauze sponges, not tissue or cotton balls. Peri-Wound Care Sween Lotion (Moisturizing lotion) Discharge Instruction: Apply moisturizing lotion as directed Topical Primary Dressing Maxorb Extra Ag+ Alginate Dressing, 2x2 (in/in) Discharge Instruction: Apply to wound bed as instructed Secondary Dressing Woven Gauze Sponge, Non-Sterile 4x4 in Discharge Instruction: Apply over primary dressing as directed. Secured With 54M Medipore H Soft Cloth Surgical T ape, 4 x 10 (in/yd) Discharge Instruction: Secure with tape as directed. Compression Wrap Kerlix Roll 4.5x3.1 (in/yd) Discharge Instruction: Apply Kerlix and Coban compression as directed. Coban Self-Adherent Wrap 4x5 (in/yd) Discharge Instruction: Apply over Kerlix as directed. Compression Stockings Add-Ons Electronic Signature(Mccoy) Signed: 05/06/2023 12:02:50 PM By: Karie Schwalbe RN Entered By: Karie Schwalbe on 05/06/2023 07:33:23 -------------------------------------------------------------------------------- Wound Assessment Details Patient Name: Date of Service: Todd Mccoy. 05/06/2023 9:45 A M Medical Record Number: [id number] Patient Account Number: [id number] Date of Birth/Sex: Treating  RN: 1971/03/31 (52 y.o. M) Primary Care Nylah Butkus: [name] Other Clinician: Referring Bevely Hackbart: Treating Sundae Maners/Extender: Todd Mccoy Weeks in Treatment: 1 Wound Status Wound Number: 11 Primary Lymphedema Etiology: Wound Location: Right, Proximal, Anterior Lower Leg Wound Open Wounding Event: Gradually Appeared Status: Date Acquired: 01/03/2023 Comorbid Anemia, Congestive Heart Failure, Hypertension, Peripheral Arterial Weeks Of Treatment: 1 History: Disease, Type II Diabetes, End Stage Renal Disease, Clustered Wound: No Osteomyelitis, Neuropathy Photos LENNIX, WILTGEN ([id number]) [phone] Page 8 of 10 Wound Measurements Length: (cm) 0.5 Width: (cm) 0.5 Depth: (cm) 0.1 Area: (cm) 0.196 Volume: (cm) 0.02 % Reduction in Area: -148.1% % Reduction in Volume: -150% Epithelialization: Small (1-33%) Tunneling: No Undermining: No Wound Description Classification: Full Thickness Without Exposed Support Structures Wound Margin: Distinct, outline attached Exudate Amount: None Present Foul Odor After Cleansing: No Slough/Fibrino No Wound Bed Granulation Amount: Medium (34-66%) Exposed Structure Granulation Quality: Pink Fascia Exposed: No Necrotic Amount: Medium (34-66%) Fat Layer (Subcutaneous Tissue) Exposed: Yes Necrotic Quality: Eschar Tendon Exposed: No Muscle Exposed: No Joint Exposed: No Bone Exposed: No Periwound Skin Texture Texture Color No Abnormalities Noted: Yes No Abnormalities Noted: Yes Moisture Temperature / Pain No Abnormalities Noted: Yes Temperature: No Abnormality Treatment Notes Wound #11 (Lower Leg) Wound Laterality: Right, Anterior, Proximal Cleanser Soap and Water Discharge Instruction: May shower and wash wound with dial antibacterial soap and water prior to dressing change. Wound Cleanser Discharge Instruction: Cleanse the wound with wound cleanser prior to applying a clean  dressing using gauze sponges, not tissue or cotton balls. Peri-Wound Care Sween Lotion (Moisturizing lotion) Discharge Instruction: Apply moisturizing lotion as directed Topical Primary Dressing Maxorb Extra Ag+ Alginate Dressing, 2x2 (in/in) Discharge Instruction: Apply to wound bed as instructed Secondary Dressing Woven Gauze Sponge, Non-Sterile 4x4 in Discharge Instruction: Apply over primary dressing as directed. Secured With 54M Medipore H Soft Cloth Surgical T ape, 4 x 10 (in/yd) Discharge Instruction: Secure with tape as directed. Compression Wrap Kerlix Roll 4.5x3.1 (in/yd) Discharge Instruction: Apply Kerlix and Coban compression as directed. Coban Self-Adherent Wrap 4x5 (in/yd) Discharge Instruction: Apply over Kerlix as directed. WOOD, AXELSON ([id number]) [id number] Page 9 of 10 Compression Stockings Add-Ons Electronic Signature(Mccoy) Signed: 05/06/2023 12:02:50 PM By: Karie Schwalbe RN Entered By: Karie Schwalbe on 05/06/2023 07:31:46 -------------------------------------------------------------------------------- Wound Assessment Details Patient Name: Date of Service: Todd Mccoy. 05/06/2023 9:45 A M Medical Record Number: [id number] Patient Account Number: [id number] Date of Birth/Sex: Treating RN: 10/28/1970 (52 y.o. M) Primary Care Sharnice Bosler: [name] Other Clinician: Referring Skai Lickteig: Treating  Daven Pinckney/Extender: Whitney Muse, [name] Weeks in Treatment: 1 Wound Status Wound Number: 9 Primary Diabetic Wound/Ulcer of the Lower Extremity Etiology: Wound Location: Right T Fifth oe Wound Open Wounding Event: Gradually Appeared Status: Date Acquired: 01/03/2023 Comorbid Anemia, Congestive Heart Failure, Hypertension, Peripheral Arterial Weeks Of Treatment: 1 History: Disease, Type II Diabetes, End Stage Renal Disease, Clustered Wound: No Osteomyelitis, Neuropathy Photos Wound Measurements Length:  (cm) 2.6 Width: (cm) 1.7 Depth: (cm) 0.1 Area: (cm) 3.471 Volume: (cm) 0.347 % Reduction in Area: 44.8% % Reduction in Volume: 44.7% Epithelialization: Small (1-33%) Tunneling: No Undermining: No Wound Description Classification: Grade 1 Wound Margin: Distinct, outline attached Exudate Amount: Medium Exudate Type: Serosanguineous Exudate Color: red, brown Foul Odor After Cleansing: No Slough/Fibrino Yes Wound Bed Granulation Amount: Large (67-100%) Exposed Structure Granulation Quality: Red Fascia Exposed: No Necrotic Amount: Small (1-33%) Fat Layer (Subcutaneous Tissue) Exposed: Yes Necrotic Quality: Adherent Slough Tendon Exposed: No Muscle Exposed: No Joint Exposed: No Bone Exposed: No [address] BIAGGIO, KOSTURA ([id number]) [id number] Page 10 of 10 No Abnormalities Noted: No No Abnormalities Noted: Yes Callus: Yes Temperature / Pain Temperature: No Abnormality Moisture No Abnormalities Noted: No Dry / Scaly: Yes Treatment Notes Wound #9 (Toe Fifth) Wound Laterality: Right Cleanser Soap and Water Discharge Instruction: May shower and wash wound with dial antibacterial soap and water prior to dressing change. Wound Cleanser Discharge Instruction: Cleanse the wound with wound cleanser prior to applying a clean dressing using gauze sponges, not tissue or cotton balls. Peri-Wound Care Sween Lotion (Moisturizing lotion) Discharge Instruction: Apply moisturizing lotion as directed Topical Primary Dressing Maxorb Extra Ag+ Alginate Dressing, 2x2 (in/in) Discharge Instruction: Apply to wound bed as instructed Secondary Dressing Woven Gauze Sponge, Non-Sterile 4x4 in Discharge Instruction: Apply over primary dressing as directed. Secured With 44M Medipore H Soft Cloth Surgical T ape, 4 x 10 (in/yd) Discharge Instruction: Secure with tape as directed. Compression Wrap Compression  Stockings Add-Ons Electronic Signature(Mccoy) Signed: 05/06/2023 12:02:50 PM By: Karie Schwalbe RN Entered By: Karie Schwalbe on 05/06/2023 07:28:49 -------------------------------------------------------------------------------- Vitals Details Patient Name: Date of Service: Todd Mccoy. 05/06/2023 9:45 A M Medical Record Number: [id number] Patient Account Number: [id number] Date of Birth/Sex: Treating RN: 29-Mar-1971 (52 y.o. M) Primary Care Kolbi Tofte: [name] Other Clinician: Referring Curtisha Bendix: Treating Emari Demmer/Extender: Whitney Muse, [name] Weeks in Treatment: 1 Vital Signs Time Taken: 10:07 Temperature (F): 97.5 Pulse (bpm): 72 Respiratory Rate (breaths/min): 16 Blood Pressure (mmHg): 140/79 Reference Range: 80 - 120 mg / dl Electronic Signature(Mccoy) Signed: 05/18/2023 11:11:27 AM By: Todd Mccoy Entered By: Todd Mccoy on 05/06/2023 07:09:26

## 2023-05-19 ENCOUNTER — Encounter (HOSPITAL_COMMUNITY): Payer: Self-pay | Admitting: Vascular Surgery

## 2023-05-19 ENCOUNTER — Other Ambulatory Visit: Payer: Self-pay

## 2023-05-19 NOTE — Progress Notes (Addendum)
PCP - [name], MD  Cardiologist - Quintella Reichert, MD   PPM/ICD - denies Device Orders - n/a Rep Notified - n/a  Chest x-ray - denies EKG - 02-05-23 Stress Test -04-23-21  ECHO - 02-05-23 Cardiac Cath - denies  CPAP - denies    Fasting Blood Sugar - per patient 140-150 Checks Blood Sugar per patient he takes blood sugar once a day in the evening/  Blood Thinner Instructions:  Aspirin Instructions:   ERAS Protcol - NPO  COVID TEST- no  Anesthesia review: Yes hx of Dm, CHF, HTN  Patient verbally denies any shortness of breath, fever, cough and chest pain during phone call   -------------  SDW INSTRUCTIONS given:  Your procedure is scheduled on May 23, 2023.  Report to [facility] Main Entrance "A" at 5:30 A.M., and check in at the Admitting office.  Call this number if you have problems the morning of surgery:  [phone]   Remember:  Do not eat after midnight the night before your surgery     Take these medicines the morning of surgery with A SIP OF WATER  acetaminophen (TYLENOL)  amLODipine (NORVASC)  atorvastatin (LIPITOR)  carvedilol (COREG)  doxycycline (MONODOX)  gabapentin (NEURONTIN)  hydrALAZINE (APRESOLINE)  isosorbide mononitrate (IMDUR   IF NEEDED hydrOXYzine (ATARAX)   WHAT DO I DO ABOUT MY DIABETES MEDICATION?   Do not take oral diabetes medicines (pills) the morning of surgery.  THE NIGHT BEFORE SURGERY, take 2 units of LANTUS insulin.       THE MORNING OF SURGERY, take 0 units of insulin.  The day of surgery, do not take other diabetes injectables, including Byetta (exenatide), Bydureon (exenatide ER), Victoza (liraglutide), or Trulicity (dulaglutide).  If your CBG is greater than 220 mg/dL, you may take  of your sliding scale (correction) dose of insulin.   HOW TO MANAGE YOUR DIABETES BEFORE AND AFTER SURGERY  Why is it important to control my blood sugar before and after surgery? Improving blood sugar levels  before and after surgery helps healing and can limit problems. A way of improving blood sugar control is eating a healthy diet by:  Eating less sugar and carbohydrates  Increasing activity/exercise  Talking with your doctor about reaching your blood sugar goals High blood sugars (greater than 180 mg/dL) can raise your risk of infections and slow your recovery, so you will need to focus on controlling your diabetes during the weeks before surgery. Make sure that the doctor who takes care of your diabetes knows about your planned surgery including the date and location.  How do I manage my blood sugar before surgery? Check your blood sugar at least 4 times a day, starting 2 days before surgery, to make sure that the level is not too high or low.  Check your blood sugar the morning of your surgery when you wake up and every 2 hours until you get to the Short Stay unit.  If your blood sugar is less than 70 mg/dL, you will need to treat for low blood sugar: Do not take insulin. Treat a low blood sugar (less than 70 mg/dL) with  cup of clear juice (cranberry or apple), 4 glucose tablets, OR glucose gel. Recheck blood sugar in 15 minutes after treatment (to make sure it is greater than 70 mg/dL). If your blood sugar is not greater than 70 mg/dL on recheck, call [phone] for further instructions. Report your blood sugar to the short stay nurse when you get to Short  Stay.  If you are admitted to the hospital after surgery: Your blood sugar will be checked by the staff and you will probably be given insulin after surgery (instead of oral diabetes medicines) to make sure you have good blood sugar levels. The goal for blood sugar control after surgery is 80-180 mg/dL.   As of today, STOP taking any Aspirin (unless otherwise instructed by your surgeon) Aleve, Naproxen, Ibuprofen, Motrin, Advil, Goody's, BC's, all herbal medications, fish oil, and all vitamins.                      Do not wear  jewelry, make up, or nail polish            Do not wear lotions, powders, perfumes/colognes, or deodorant.            Do not shave 48 hours prior to surgery.  Men may shave face and neck.            Do not bring valuables to the hospital.            [facility] is not responsible for any belongings or valuables.  Do NOT Smoke (Tobacco/Vaping) 24 hours prior to your procedure If you use a CPAP at night, you may bring all equipment for your overnight stay.   Contacts, glasses, dentures or bridgework may not be worn into surgery.      For patients admitted to the hospital, discharge time will be determined by your treatment team.   Patients discharged the day of surgery will not be allowed to drive home, and someone needs to stay with them for 24 hours.    Special instructions:   [facility]- Preparing For Surgery  Before surgery, you can play an important role. Because skin is not sterile, your skin needs to be as free of germs as possible. You can reduce the number of germs on your skin by washing with CHG (chlorahexidine gluconate) Soap before surgery.  CHG is an antiseptic cleaner which kills germs and bonds with the skin to continue killing germs even after washing.    Oral Hygiene is also important to reduce your risk of infection.  Remember - BRUSH YOUR TEETH THE MORNING OF SURGERY WITH YOUR REGULAR TOOTHPASTE  Please do not use if you have an allergy to CHG or antibacterial soaps. If your skin becomes reddened/irritated stop using the CHG.  Do not shave (including legs and underarms) for at least 48 hours prior to first CHG shower. It is OK to shave your face.  Please follow these instructions carefully.   Shower the NIGHT BEFORE SURGERY and the MORNING OF SURGERY with DIAL Soap.   Pat yourself dry with a CLEAN TOWEL.  Wear CLEAN PAJAMAS to bed the night before surgery  Place CLEAN SHEETS on your bed the night of your first shower and DO NOT SLEEP WITH PETS.   Day of  Surgery: Please shower morning of surgery  Wear Clean/Comfortable clothing the morning of surgery Do not apply any deodorants/lotions.   Remember to brush your teeth WITH YOUR REGULAR TOOTHPASTE.   Questions were answered. Patient verbalized understanding of instructions.

## 2023-05-20 ENCOUNTER — Encounter (HOSPITAL_BASED_OUTPATIENT_CLINIC_OR_DEPARTMENT_OTHER): Payer: Medicare Other | Admitting: General Surgery

## 2023-05-20 DIAGNOSIS — E11621 Type 2 diabetes mellitus with foot ulcer: Secondary | ICD-10-CM | POA: Diagnosis not present

## 2023-05-20 NOTE — Progress Notes (Signed)
Anesthesia Chart Review: Same day workup  52 year old male with pertinent history including poorly controlled hypertension, insulin-dependent DM2 (A1c 5.6 on 02/06/2023), combined heart failure, ESRD on HD, blindness, PVD s/p left BKA.  Recent admission 8/2 through 02/14/2023 after presenting with 3 days of diarrhea, decreased urine output, decreased oral intake. Patient noted to be volume overloaded on examination and hypotensive. Patient noted to be in acute on chronic renal failure requiring CVVHD. Patient also required pressors for shock felt likely combination of hypovolemic and septic shock. Patient received a course of IV antibiotics for possible UTI.  He ultimately progressed to requiring HD had [facility] placed and was set up with outpatient dialysis on Tuesday Thursday Saturday.  Echo done during admission showed EF 40 to 45%, grade 3 DD, moderately reduced RV systolic function with RVSP 67.7 mmHg, moderate circumference pericardial effusion, no significant valve abnormalities.  Cardiology was consulted.  Per note 02/08/2023 by Dr. Cristal Deer, "cath deferred this admission given progression of renal disease. High suspicion that marked volume overload may be etiology of failure, recommend rechecking echo in 3 mos. Given that it is unclear whether he will need long term HD at this time, will avoid contrast."  Prior to hospitalization patient was already following with cardiology for history of combined heart failure.  Prior echo in July 2023 showed EF of 38% with grade 3 DD.  PYP scan 02/2022 was equivocal for amyloidosis.  Nuclear stress 04/23/2021 was negative for ischemia or infarction.  Patient will need day of surgery labs and evaluation.  EKG 02/05/2023: Sinus bradycardia. Nonspecific intraventricular conduction delay. Borderline T abnormalities, inferior leads  TTE 02/05/2023:  1. Left ventricular ejection fraction, by estimation, is 40 to 45%. The  left ventricle has mildly decreased function. The left  ventricle  demonstrates global hypokinesis. There is moderate left ventricular  hypertrophy. Left ventricular diastolic  parameters are consistent with Grade III diastolic dysfunction  (restrictive).   2. Right ventricular systolic function is moderately reduced. The right  ventricular size is moderately enlarged. There is severely elevated  pulmonary artery systolic pressure. The estimated right ventricular  systolic pressure is 67.7 mmHg.   3. Left atrial size was moderately dilated.   4. Right atrial size was moderately dilated.   5. Moderate pericardial effusion. The pericardial effusion is  circumferential. There is no definite evidence of cardiac tamponade by  echocardiogram (BP and HR noted above).   6. The mitral valve is normal in structure. Trivial mitral valve  regurgitation. No evidence of mitral stenosis.   7. The aortic valve is tricuspid. Aortic valve regurgitation is not  visualized. No aortic stenosis is present.   8. The inferior vena cava is dilated in size with <50% respiratory  variability, suggesting right atrial pressure of 15 mmHg.     [facility] Short Stay Center/Anesthesiology Phone [phone] 05/20/2023 10:30 AM

## 2023-05-20 NOTE — Progress Notes (Addendum)
JACOBB, MOHIUDDIN ([id number]) [id number] Page 1 of 10 Visit Report for 05/20/2023 Arrival Information Details Patient Name: Date of Service: [name] 05/20/2023 8:45 A M Medical Record Number: [id number] Patient Account Number: [id number] Date of Birth/Sex: Treating RN: [birth year] (52 y.o. Male) Primary Care Dionicia Cerritos: [name] Other Clinician: Referring Tonyetta Berko: Treating Shahrzad Koble/Extender: Camillo Flaming Weeks in Treatment: 3 Visit Information History Since Last Visit Added or deleted any medications: No Patient Arrived: Wheel Chair Any new allergies or adverse reactions: No Arrival Time: 09:21 Had a fall or experienced change in No Accompanied By: self activities of daily living that may affect Transfer Assistance: None risk of falls: Patient Identification Verified: Yes Signs or symptoms of abuse/neglect since last visito No Secondary Verification Process Completed: Yes Hospitalized since last visit: No Implantable device outside of the clinic excluding No cellular tissue based products placed in the center since last visit: Has Dressing in Place as Prescribed: Yes Pain Present Now: No Electronic Signature(s) Signed: 05/20/2023 10:02:03 AM By: Karl Ito Entered By: Karl Ito on 05/20/2023 09:22:10 -------------------------------------------------------------------------------- Encounter Discharge Information Details Patient Name: Date of Service: [name] 05/20/2023 8:45 A M Medical Record Number: [id number] Patient Account Number: [id number] Date of Birth/Sex: Treating RN: 03/17/1971 (52 y.o. Male) Karie Schwalbe Primary Care Edrian Melucci: [name] Other Clinician: Referring Shannen Vernon: Treating Tarena Gockley/Extender: Camillo Flaming Weeks in Treatment: 3 Encounter Discharge Information Items Post Procedure Vitals Discharge Condition:  Stable Temperature (F): 97.8 Ambulatory Status: Ambulatory Pulse (bpm): 87 Discharge Destination: Home Respiratory Rate (breaths/min): 16 Transportation: Private Auto Blood Pressure (mmHg): 129/99 Accompanied By: self Schedule Follow-up Appointment: Yes Clinical Summary of Care: Patient Declined Electronic Signature(s) Signed: 05/20/2023 1:08:41 PM By: Karie Schwalbe RN Entered By: Karie Schwalbe on 05/20/2023 10:48:55 [name] ([id number]) [id number] Page 2 of 10 -------------------------------------------------------------------------------- Lower Extremity Assessment Details Patient Name: Date of Service: MO [name] [place] THA N S. 05/20/2023 8:45 A M Medical Record Number: [id number] Patient Account Number: [id number] Date of Birth/Sex: Treating RN: 03-Jan-1971 (52 y.o. Male) Shawn Stall Primary Care Sabatino Williard: [name] Other Clinician: Referring Tania Steinhauser: Treating Lexis Potenza/Extender: Camillo Flaming Weeks in Treatment: 3 Edema Assessment Assessed: [Left: No] [Right: Yes] Edema: [Left: Ye] [Right: s] Calf Left: Right: Point of Measurement: From Medial Instep 34 cm Ankle Left: Right: Point of Measurement: From Medial Instep 23 cm Vascular Assessment Pulses: Dorsalis Pedis Palpable: [Right:Yes] Extremity colors, hair growth, and conditions: Extremity Color: [Right:Normal] Hair Growth on Extremity: [Right:No] Temperature of Extremity: [Right:Warm] Capillary Refill: [Right:< 3 seconds] Dependent Rubor: [Right:No] Blanched when Elevated: [Right:No No] Toe Nail Assessment Left: Right: Thick: Yes Discolored: Yes Deformed: Yes Improper Length and Hygiene: No Electronic Signature(s) Signed: 05/20/2023 1:18:47 PM By: Shawn Stall RN, [name] Entered By: Shawn Stall on 05/20/2023 09:50:27 -------------------------------------------------------------------------------- Multi Wound Chart Details Patient Name:  Date of Service: [name] 05/20/2023 8:45 A M Medical Record Number: [id number] Patient Account Number: [id number] Date of Birth/Sex: Treating RN: 31-Jan-1971 (52 y.o. Male) Primary Care Mauro Arps: [name] Other Clinician: Referring Paislynn Hegstrom: Treating Tyshea Imel/Extender: Camillo Flaming Weeks in Treatment: 3 Vital Signs Height(in): Pulse(bpm): 87 Weight(lbs): Blood Pressure(mmHg): 129/99 LUBY, PETTIJOHN ([id number]) [id number].pdf Page 3 of 10 Body Mass Index(BMI): Temperature(F): 97.8 Respiratory Rate(breaths/min): 16 [10:Photos:] Right, Anterior Lower Leg Right, Proximal, Anterior Lower Leg Right T Fifth oe Wound Location: Gradually Appeared Gradually Appeared Gradually Appeared Wounding Event: Lymphedema Lymphedema Diabetic Wound/Ulcer  of the Lower Primary Etiology: Extremity Anemia, Congestive Heart Failure, Anemia, Congestive Heart Failure, Anemia, Congestive Heart Failure, Comorbid History: Hypertension, Peripheral Arterial Hypertension, Peripheral Arterial Hypertension, Peripheral Arterial Disease, Type II Diabetes, End Stage Disease, Type II Diabetes, End Stage Disease, Type II Diabetes, End Stage Renal Disease, Osteomyelitis, Renal Disease, Osteomyelitis, Renal Disease, Osteomyelitis, Neuropathy Neuropathy Neuropathy 01/03/2023 01/03/2023 01/03/2023 Date Acquired: 3 3 3  Weeks of Treatment: Open Open Open Wound Status: No No No Wound Recurrence: 0.9x0.7x0.1 0.5x0.5x0.1 2.2x0.6x0.1 Measurements L x W x D (cm) 0.495 0.196 1.037 A (cm) : rea 0.049 0.02 0.104 Volume (cm) : 50.00% -148.10% 83.50% % Reduction in A rea: 50.50% -150.00% 83.40% % Reduction in Volume: Full Thickness Without Exposed Full Thickness Without Exposed Grade 1 Classification: Support Structures Support Structures Medium Medium Medium Exudate A mount: Serosanguineous Serosanguineous Serosanguineous Exudate Type: red, brown  red, brown red, brown Exudate Color: Distinct, outline attached Distinct, outline attached Distinct, outline attached Wound Margin: Large (67-100%) Large (67-100%) Large (67-100%) Granulation A mount: Pink Pink Red, Hyper-granulation Granulation Quality: Small (1-33%) Small (1-33%) Small (1-33%) Necrotic A mount: Adherent Slough Eschar, Adherent Slough Adherent Slough Necrotic Tissue: Fat Layer (Subcutaneous Tissue): Yes Fat Layer (Subcutaneous Tissue): Yes Fat Layer (Subcutaneous Tissue): Yes Exposed Structures: Fascia: No Fascia: No Fascia: No Tendon: No Tendon: No Tendon: No Muscle: No Muscle: No Muscle: No Joint: No Joint: No Joint: No Bone: No Bone: No Bone: No Small (1-33%) Small (1-33%) Medium (34-66%) Epithelialization: Debridement - Excisional Debridement - Excisional Debridement - Excisional Debridement: Pre-procedure Verification/Time Out 10:04 10:04 10:04 Taken: Lidocaine 5% topical ointment Lidocaine 5% topical ointment Lidocaine 5% topical ointment Pain Control: Necrotic/Eschar, Subcutaneous, Necrotic/Eschar, Subcutaneous, Subcutaneous, Slough Tissue Debrided: [employer] Skin/Subcutaneous Tissue Skin/Subcutaneous Tissue Skin/Subcutaneous Tissue Level: 0.49 0.2 1.04 Debridement A (sq cm): rea Curette Curette Curette Instrument: Minimum Minimum Minimum Bleeding: Pressure Pressure Pressure Hemostasis Achieved: Debridement Treatment Response: Procedure was tolerated well Procedure was tolerated well Procedure was tolerated well Post Debridement Measurements L x 0.9x0.7x0.1 0.5x0.5x0.1 2.2x0.6x0.1 W x D (cm) 0.049 0.02 0.104 Post Debridement Volume: (cm) No Abnormalities Noted No Abnormalities Noted Callus: Yes Periwound Skin Texture: No Abnormalities Noted No Abnormalities Noted Dry/Scaly: Yes Periwound Skin Moisture: No Abnormalities Noted No Abnormalities Noted No Abnormalities Noted Periwound Skin Color: No Abnormality No Abnormality No  Abnormality Temperature: Debridement Debridement Debridement Procedures Performed: Treatment Notes Electronic Signature(s) Signed: 05/20/2023 10:23:44 AM By: Duanne Guess MD FACS Entered By: Duanne Guess on 05/20/2023 10:23:44 [name] ([id number]) [id number] Page 4 of 10 -------------------------------------------------------------------------------- Multi-Disciplinary Care Plan Details Patient Name: Date of Service: MO [name] [place] THA N S. 05/20/2023 8:45 A M Medical Record Number: [id number] Patient Account Number: [id number] Date of Birth/Sex: Treating RN: 1971-06-22 (52 y.o. Male) Shawn Stall Primary Care Aneya Daddona: [name] Other Clinician: Referring Rhylen Pulido: Treating Chaos Carlile/Extender: Camillo Flaming Weeks in Treatment: 3 Multidisciplinary Care Plan reviewed with physician Active Inactive Electronic Signature(s) Signed: 07/25/2023 10:17:11 AM By: Shawn Stall RN, [name] Previous Signature: 05/20/2023 1:18:47 PM Version By: Shawn Stall RN, [name] Entered By: Shawn Stall on 07/25/2023 10:17:10 -------------------------------------------------------------------------------- Pain Assessment Details Patient Name: Date of Service: [name] 05/20/2023 8:45 A M Medical Record Number: [id number] Patient Account Number: [id number] Date of Birth/Sex: Treating RN: 01/09/1971 (52 y.o. Male) Primary Care Fayelynn Distel: [name] Other Clinician: Referring Dontez Hauss: Treating Liala Codispoti/Extender: Camillo Flaming Weeks in Treatment: 3 Active Problems Location of Pain Severity and Description of Pain Patient Has Paino No Site Locations  Pain Management and Medication Current Pain Management: Electronic Signature(s) Signed: 05/20/2023 10:02:03 AM By: Karl Ito Entered By: Karl Ito on 05/20/2023 09:22:40 [name] ([id number])  [id number] Page 5 of 10 -------------------------------------------------------------------------------- Patient/Caregiver Education Details Patient Name: Date of Service: MO [name] NA THA Vaughan Sine 11/15/2024andnbsp8:45 A M Medical Record Number: [id number] Patient Account Number: [id number] Date of Birth/Gender: Treating RN: 04-Aug-1970 (52 y.o. Male) Shawn Stall Primary Care Physician: [name] Other Clinician: Referring Physician: Treating Physician/Extender: Camillo Flaming Weeks in Treatment: 3 Education Assessment Education Provided To: Patient Education Topics Provided Wound/Skin Impairment: Handouts: Caring for Your Ulcer Methods: Explain/Verbal Responses: Reinforcements needed Electronic Signature(s) Signed: 05/20/2023 1:18:47 PM By: Shawn Stall RN, [name] Entered By: Shawn Stall on 05/20/2023 09:53:29 -------------------------------------------------------------------------------- Wound Assessment Details Patient Name: Date of Service: [name] 05/20/2023 8:45 A M Medical Record Number: [id number] Patient Account Number: [id number] Date of Birth/Sex: Treating RN: 1971/06/06 (52 y.o. Male) Primary Care Topher Buenaventura: [name] Other Clinician: Referring Liora Myles: Treating Relena Ivancic/Extender: Camillo Flaming Weeks in Treatment: 3 Wound Status Wound Number: 10 Primary Lymphedema Etiology: Wound Location: Right, Anterior Lower Leg Wound Open Wounding Event: Gradually Appeared Status: Date Acquired: 01/03/2023 Comorbid Anemia, Congestive Heart Failure, Hypertension, Peripheral Arterial Weeks Of Treatment: 3 History: Disease, Type II Diabetes, End Stage Renal Disease, Clustered Wound: No Osteomyelitis, Neuropathy Photos Wound Measurements LESHON, RANTZ ([id number]) Length: (cm) 0.9 Width: (cm) 0.7 Depth: (cm) 0.1 Area: (cm) 0.495 Volume: (cm)  0.049 [id number] Page 6 of 10 % Reduction in Area: 50% % Reduction in Volume: 50.5% Epithelialization: Small (1-33%) Tunneling: No Undermining: No Wound Description Classification: Full Thickness Without Exposed Support Structures Wound Margin: Distinct, outline attached Exudate Amount: Medium Exudate Type: Serosanguineous Exudate Color: red, brown Foul Odor After Cleansing: No Slough/Fibrino Yes Wound Bed Granulation Amount: Large (67-100%) Exposed Structure Granulation Quality: Pink Fascia Exposed: No Necrotic Amount: Small (1-33%) Fat Layer (Subcutaneous Tissue) Exposed: Yes Necrotic Quality: Adherent Slough Tendon Exposed: No Muscle Exposed: No Joint Exposed: No Bone Exposed: No Periwound Skin Texture Texture Color No Abnormalities Noted: Yes No Abnormalities Noted: Yes Moisture Temperature / Pain No Abnormalities Noted: Yes Temperature: No Abnormality Treatment Notes Wound #10 (Lower Leg) Wound Laterality: Right, Anterior Cleanser Soap and Water Discharge Instruction: May shower and wash wound with dial antibacterial soap and water prior to dressing change. Wound Cleanser Discharge Instruction: Cleanse the wound with wound cleanser prior to applying a clean dressing using gauze sponges, not tissue or cotton balls. Peri-Wound Care Sween Lotion (Moisturizing lotion) Discharge Instruction: Apply moisturizing lotion as directed Topical Primary Dressing Maxorb Extra Ag+ Alginate Dressing, 2x2 (in/in) Discharge Instruction: Apply to wound bed as instructed Secondary Dressing Woven Gauze Sponge, Non-Sterile 4x4 in Discharge Instruction: Apply over primary dressing as directed. Secured With 26M Medipore H Soft Cloth Surgical T ape, 4 x 10 (in/yd) Discharge Instruction: Secure with tape as directed. Compression Wrap Kerlix Roll 4.5x3.1 (in/yd) Discharge Instruction: Apply Kerlix and Coban compression as directed. Coban Self-Adherent Wrap  4x5 (in/yd) Discharge Instruction: Apply over Kerlix as directed. Compression Stockings Add-Ons Electronic Signature(s) Signed: 05/20/2023 1:18:47 PM By: Shawn Stall RN, [name] Entered By: Shawn Stall on 05/20/2023 09:51:28 [name] ([id number]) [id number] Page 7 of 10 -------------------------------------------------------------------------------- Wound Assessment Details Patient Name: Date of Service: [name] 05/20/2023 8:45 A M Medical Record Number: [id number] Patient Account Number: [id number] Date of Birth/Sex: Treating RN: 10-04-1970 (52 y.o. Male) Primary Care Londen Lorge: Alvis Lemmings,  [name] Other Clinician: Referring Solomon Skowronek: Treating Fia Hebert/Extender: Camillo Flaming Weeks in Treatment: 3 Wound Status Wound Number: 11 Primary Lymphedema Etiology: Wound Location: Right, Proximal, Anterior Lower Leg Wound Open Wounding Event: Gradually Appeared Status: Date Acquired: 01/03/2023 Comorbid Anemia, Congestive Heart Failure, Hypertension, Peripheral Arterial Weeks Of Treatment: 3 History: Disease, Type II Diabetes, End Stage Renal Disease, Clustered Wound: No Osteomyelitis, Neuropathy Photos Wound Measurements Length: (cm) 0.5 Width: (cm) 0.5 Depth: (cm) 0.1 Area: (cm) 0.196 Volume: (cm) 0.02 % Reduction in Area: -148.1% % Reduction in Volume: -150% Epithelialization: Small (1-33%) Tunneling: No Undermining: No Wound Description Classification: Full Thickness Without Exposed Support Structures Wound Margin: Distinct, outline attached Exudate Amount: Medium Exudate Type: Serosanguineous Exudate Color: red, brown Foul Odor After Cleansing: No Slough/Fibrino No Wound Bed Granulation Amount: Large (67-100%) Exposed Structure Granulation Quality: Pink Fascia Exposed: No Necrotic Amount: Small (1-33%) Fat Layer (Subcutaneous Tissue) Exposed: Yes Necrotic Quality: Eschar, Adherent Slough Tendon  Exposed: No Muscle Exposed: No Joint Exposed: No Bone Exposed: No Periwound Skin Texture Texture Color No Abnormalities Noted: Yes No Abnormalities Noted: Yes Moisture Temperature / Pain No Abnormalities Noted: Yes Temperature: No Abnormality Treatment Notes Wound #11 (Lower Leg) Wound Laterality: Right, Anterior, Proximal Cleanser Soap and Water Discharge Instruction: May shower and wash wound with dial antibacterial soap and water prior to dressing change. KARSYN, STILTZ ([id number]) [id number] Page 8 of 10 Wound Cleanser Discharge Instruction: Cleanse the wound with wound cleanser prior to applying a clean dressing using gauze sponges, not tissue or cotton balls. Peri-Wound Care Sween Lotion (Moisturizing lotion) Discharge Instruction: Apply moisturizing lotion as directed Topical Primary Dressing Maxorb Extra Ag+ Alginate Dressing, 2x2 (in/in) Discharge Instruction: Apply to wound bed as instructed Secondary Dressing Woven Gauze Sponge, Non-Sterile 4x4 in Discharge Instruction: Apply over primary dressing as directed. Secured With 60M Medipore H Soft Cloth Surgical T ape, 4 x 10 (in/yd) Discharge Instruction: Secure with tape as directed. Compression Wrap Kerlix Roll 4.5x3.1 (in/yd) Discharge Instruction: Apply Kerlix and Coban compression as directed. Coban Self-Adherent Wrap 4x5 (in/yd) Discharge Instruction: Apply over Kerlix as directed. Compression Stockings Add-Ons Electronic Signature(s) Signed: 05/20/2023 1:18:47 PM By: Shawn Stall RN, [name] Entered By: Shawn Stall on 05/20/2023 09:51:48 -------------------------------------------------------------------------------- Wound Assessment Details Patient Name: Date of Service: [name] 05/20/2023 8:45 A M Medical Record Number: [id number] Patient Account Number: [id number] Date of Birth/Sex: Treating RN: 1970/11/28 (52 y.o. Male) Primary Care Nelda Luckey: [name] Other Clinician: Referring Dayna Alia: Treating Delani Kohli/Extender: Camillo Flaming Weeks in Treatment: 3 Wound Status Wound Number: 9 Primary Diabetic Wound/Ulcer of the Lower Extremity Etiology: Wound Location: Right T Fifth oe Wound Open Wounding Event: Gradually Appeared Status: Date Acquired: 01/03/2023 Comorbid Anemia, Congestive Heart Failure, Hypertension, Peripheral Arterial Weeks Of Treatment: 3 History: Disease, Type II Diabetes, End Stage Renal Disease, Clustered Wound: No Osteomyelitis, Neuropathy Photos JAYVEON, DEMO ([id number]) [id number] Page 9 of 10 Wound Measurements Length: (cm) 2.2 Width: (cm) 0.6 Depth: (cm) 0.1 Area: (cm) 1.037 Volume: (cm) 0.104 % Reduction in Area: 83.5% % Reduction in Volume: 83.4% Epithelialization: Medium (34-66%) Tunneling: No Undermining: No Wound Description Classification: Grade 1 Wound Margin: Distinct, outline attached Exudate Amount: Medium Exudate Type: Serosanguineous Exudate Color: red, brown Foul Odor After Cleansing: No Slough/Fibrino Yes Wound Bed Granulation Amount: Large (67-100%) Exposed Structure Granulation Quality: Red, Hyper-granulation Fascia Exposed: No Necrotic Amount: Small (1-33%) Fat Layer (Subcutaneous Tissue) Exposed: Yes Necrotic Quality: Adherent Slough Tendon Exposed: No Muscle Exposed: No Joint Exposed:  No Bone Exposed: No Periwound Skin Texture Texture Color No Abnormalities Noted: No No Abnormalities Noted: Yes Callus: Yes Temperature / Pain Temperature: No Abnormality Moisture No Abnormalities Noted: No Dry / Scaly: Yes Treatment Notes Wound #9 (Toe Fifth) Wound Laterality: Right Cleanser Soap and Water Discharge Instruction: May shower and wash wound with dial antibacterial soap and water prior to dressing change. Wound Cleanser Discharge Instruction: Cleanse the wound with wound cleanser prior to applying a clean  dressing using gauze sponges, not tissue or cotton balls. Peri-Wound Care Sween Lotion (Moisturizing lotion) Discharge Instruction: Apply moisturizing lotion as directed Topical Primary Dressing Maxorb Extra Ag+ Alginate Dressing, 2x2 (in/in) Discharge Instruction: Apply to wound bed as instructed Secondary Dressing Woven Gauze Sponge, Non-Sterile 4x4 in Discharge Instruction: Apply over primary dressing as directed. Secured With 11M Medipore H Soft Cloth Surgical T ape, 4 x 10 (in/yd) Discharge Instruction: Secure with tape as directed. Compression Wrap Compression Stockings Add-Ons Electronic Signature(s) Signed: 05/20/2023 1:18:47 PM By: Shawn Stall RN, [name] Entered By: Shawn Stall on 05/20/2023 09:52:30 [name] ([id number]) [id number] Page 10 of 10 -------------------------------------------------------------------------------- Vitals Details Patient Name: Date of Service: [name] 05/20/2023 8:45 A M Medical Record Number: [id number] Patient Account Number: [id number] Date of Birth/Sex: Treating RN: 05-14-1971 (52 y.o. Male) Primary Care Damaris Abeln: [name] Other Clinician: Referring Demario Faniel: Treating Kassidee Narciso/Extender: Camillo Flaming Weeks in Treatment: 3 Vital Signs Time Taken: 09:22 Temperature (F): 97.8 Pulse (bpm): 87 Respiratory Rate (breaths/min): 16 Blood Pressure (mmHg): 129/99 Reference Range: 80 - 120 mg / dl Electronic Signature(s) Signed: 05/20/2023 10:02:03 AM By: Karl Ito Entered By: Karl Ito on 05/20/2023 09:22:34

## 2023-05-20 NOTE — Progress Notes (Signed)
[name], [name] ([id number]) [id number] Page 1 of 13 Visit Report for 05/20/2023 Chief Complaint Document Details Patient Name: Date of Service: [name] 05/20/2023 8:45 A M Medical Record Number: [id number] Patient Account Number: [id number] Date of Birth/Sex: Treating RN: 13-May-1971 (52 y.o. M) Primary Care Provider: [name] Other Clinician: Referring Provider: Treating Provider/Extender: Whitney Muse, Odette Horns Weeks in Treatment: 3 Information Obtained from: Patient Chief Complaint 11/20/2019; patient is here with for review of multiple wounds over the right lower leg, right great and right second toe 09/08/2021; patient presents for wound to the right lateral foot 04/27/2023: wound on right lateral foot (5th metatarsal head level) and multiple RLE wounds Electronic Signature(s) Signed: 05/20/2023 10:25:26 AM By: Duanne Guess MD FACS Entered By: Duanne Guess on 05/20/2023 10:25:26 -------------------------------------------------------------------------------- Debridement Details Patient Name: Date of Service: [name] 05/20/2023 8:45 A M Medical Record Number: [id number] Patient Account Number: [id number] Date of Birth/Sex: Treating RN: 24-May-1971 (52 y.o. Todd Mccoy Primary Care Provider: [name] Other Clinician: Referring Provider: Treating Provider/Extender: Camillo Flaming Weeks in Treatment: 3 Debridement Performed for Assessment: Wound #11 Right,Proximal,Anterior Lower Leg Performed By: Physician Duanne Guess, MD The following information was scribed by: Samuella Bruin The information was scribed for: Duanne Guess Debridement Type: Debridement Level of Consciousness (Pre-procedure): Awake and Alert Pre-procedure Verification/Time Out Yes - 10:04 Taken: Start Time: 10:04 Pain Control: Lidocaine 5% topical ointment Percent of Wound  Bed Debrided: 100% T Area Debrided (cm): otal 0.2 Tissue and other material debrided: Non-Viable, Eschar, Slough, Subcutaneous, Slough Level: Skin/Subcutaneous Tissue Debridement Description: Excisional Instrument: Curette Bleeding: Minimum Hemostasis Achieved: Pressure Response to Treatment: Procedure was tolerated well Level of Consciousness (Post- Awake and Alert procedure): Post Debridement Measurements of Total Wound Length: (cm) 0.5 Width: (cm) 0.5 Depth: (cm) 0.1 Volume: (cm) 0.02 Character of Wound/Ulcer Post Debridement: Improved Todd Mccoy, Todd Mccoy ([id number]) [id number] Page 2 of 13 Post Procedure Diagnosis Same as Pre-procedure Electronic Signature(s) Signed: 05/20/2023 11:57:20 AM By: Samuella Bruin Signed: 05/20/2023 12:51:55 PM By: Duanne Guess MD FACS Entered By: Samuella Bruin on 05/20/2023 10:05:39 -------------------------------------------------------------------------------- Debridement Details Patient Name: Date of Service: [name] 05/20/2023 8:45 A M Medical Record Number: [id number] Patient Account Number: [id number] Date of Birth/Sex: Treating RN: [birth year] (52 y.o. Todd Mccoy Primary Care Provider: [name] Other Clinician: Referring Provider: Treating Provider/Extender: Camillo Flaming Weeks in Treatment: 3 Debridement Performed for Assessment: Wound #10 Right,Anterior Lower Leg Performed By: Physician Duanne Guess, MD The following information was scribed by: Samuella Bruin The information was scribed for: Duanne Guess Debridement Type: Debridement Level of Consciousness (Pre-procedure): Awake and Alert Pre-procedure Verification/Time Out Yes - 10:04 Taken: Start Time: 10:04 Pain Control: Lidocaine 5% topical ointment Percent of Wound Bed Debrided: 100% T Area Debrided (cm): otal 0.49 Tissue and other material debrided: Non-Viable,  Eschar, Slough, Subcutaneous, Slough Level: Skin/Subcutaneous Tissue Debridement Description: Excisional Instrument: Curette Bleeding: Minimum Hemostasis Achieved: Pressure Response to Treatment: Procedure was tolerated well Level of Consciousness (Post- Awake and Alert procedure): Post Debridement Measurements of Total Wound Length: (cm) 0.9 Width: (cm) 0.7 Depth: (cm) 0.1 Volume: (cm) 0.049 Character of Wound/Ulcer Post Debridement: Improved Post Procedure Diagnosis Same as Pre-procedure Electronic Signature(s) Signed: 05/20/2023 11:57:20 AM By: Samuella Bruin Signed: 05/20/2023 12:51:55 PM By: Duanne Guess MD FACS Entered By: Samuella Bruin on 05/20/2023 10:05:47 -------------------------------------------------------------------------------- Debridement Details Patient Name: Date of Service: [name]  NA THA N S. 05/20/2023 8:45 A M Medical Record Number: [id number] Patient Account Number: [id number] Todd Mccoy, Todd Mccoy ([id number]) [phone].pdf Page 3 of 13 Date of Birth/Sex: Treating RN: 22-Oct-1970 (52 y.o. Todd Mccoy Primary Care Provider: [name] Other Clinician: Referring Provider: Treating Provider/Extender: Camillo Flaming Weeks in Treatment: 3 Debridement Performed for Assessment: Wound #9 Right T Fifth oe Performed By: Physician Duanne Guess, MD The following information was scribed by: Samuella Bruin The information was scribed for: Duanne Guess Debridement Type: Debridement Severity of Tissue Pre Debridement: Fat layer exposed Level of Consciousness (Pre-procedure): Awake and Alert Pre-procedure Verification/Time Out Yes - 10:04 Taken: Start Time: 10:04 Pain Control: Lidocaine 5% topical ointment Percent of Wound Bed Debrided: 100% T Area Debrided (cm): otal 1.04 Tissue and other material debrided: Non-Viable, Slough, Subcutaneous, Slough Level: Skin/Subcutaneous  Tissue Debridement Description: Excisional Instrument: Curette Bleeding: Minimum Hemostasis Achieved: Pressure Response to Treatment: Procedure was tolerated well Level of Consciousness (Post- Awake and Alert procedure): Post Debridement Measurements of Total Wound Length: (cm) 2.2 Width: (cm) 0.6 Depth: (cm) 0.1 Volume: (cm) 0.104 Character of Wound/Ulcer Post Debridement: Improved Severity of Tissue Post Debridement: Fat layer exposed Post Procedure Diagnosis Same as Pre-procedure Electronic Signature(s) Signed: 05/20/2023 11:57:20 AM By: Samuella Bruin Signed: 05/20/2023 12:51:55 PM By: Duanne Guess MD FACS Entered By: Samuella Bruin on 05/20/2023 10:06:14 -------------------------------------------------------------------------------- HPI Details Patient Name: Date of Service: [name] 05/20/2023 8:45 A M Medical Record Number: [id number] Patient Account Number: [id number] Date of Birth/Sex: Treating RN: 09/21/1970 (52 y.o. M) Primary Care Provider: [name] Other Clinician: Referring Provider: Treating Provider/Extender: Camillo Flaming Weeks in Treatment: 3 History of Present Illness HPI Description: ADMISSION 11/20/2019; this is a 52 year old man with poorly controlled type 2 diabetes. Roughly 3 to 4 weeks ago he noted swelling of his leg open wounds with draining sores. This happened fairly suddenly. He was seen in the ER on 11/07/2019 noted to have wounds of the right lower extremity x2 weeks at that point. An x-ray of the area was negative one of the wounds was cultured showing MSSA and group B strep. He was noted to have multiple wounds. He was given a course of doxycycline for 7 days white count was 8.4 hemoglobin 9.7. Since then he has been washing the leg and applying dry gauze. He is here in the clinic for review of this. 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 At that point his culture was MRSA. No no 11/27/19-Patient returns after starting in the clinic for open leg wounds on his right and his right second toe. We have been applying silver alginate and 3 layer compression on the right. Patient completed course of doxycycline. The wounds are looking slightly smaller, the right posterior wound appears to have healed 6/8; patient is here in follow-up for open wounds on his right leg and his right second toe. X-ray I did last time was strangely negative. I gave him a course of Keflex which she is completed the area on the tip of the second toe is actually close over. All of the wounds on his right anterior lateral and posterior leg are KETIH, CORDILL ([id number]) [phone].pdf Page 4 of 13 considerably better. There is still open wounds on the right lateral and right posterior but they are measuring better.  These were felt to be abscesses for which she was hospitalized in early May. Cultures originally showed MSSA and group B strep 6/25; patient is here for review of wounds on the right lower leg which were felt to be abscesses. He also had an area on his left second toe when he came in this is closed over. Almost all the areas on the right leg are closed 2 small open areas remain. We use silver alginate to these wounds and let them wear his own stockings. Should be closed the next time he is here Readmission 09/08/2021 Mr. 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 Chronic kidney disease stage IV and combined chronic systolic and diastolic heart failure that presents to the clinic for a 1 month history of open wound to the right lateral foot. He states he visited the ED on 08/18/2021 for this issue and was prescribed  doxycycline. He states that the wound became slightly smaller after taking this. He has been using AandE ointment to the wound bed. He denies signs of infection. 3/23; patient presents for follow-up. He had a wound culture at last clinic visit that showed moderate Staph aureus and was started on Augmentin. He has been using gentamicin ointment to the wound bed. He currently denies systemic signs of infection. 3/28; patient presents for follow-up. He has completed his course of antibiotics. He continues to use gentamicin with Hydrofera Blue to the wound bed. He has no issues or complaints today. READMISSION 04/27/2023 ***ABIs 11/2022*** +-------+-----------+-----------+------------+------------+ ABI/TBIT oday's ABIT oday's TBIPrevious ABIPrevious TBI +-------+-----------+-----------+------------+------------+ Right 1.21 0.98 1.07 0  +-------+-----------+-----------+------------+------------+ Left BKA BKA 1.07   +-------+-----------+-----------+------------+------------+ He returns today with a wound over the lateral aspect of his fifth toe and metatarsal head as well as some smaller wounds on his anterior tibial surface. He has had multiple wounds on the anterior tibial surface that appear to have recently healed. Two remain open. He has home health and they have been applying Medihoney to the sites. 05/06/2023: All of the wounds are smaller today. There is slough and eschar on the anterior tibial wounds and some slough on the lateral foot wound, along with some hypertrophic granulation tissue. 05/20/2023: The wounds continue to improve. The anterior tibial wounds are much smaller with slough and eschar present. The lateral foot wound also has some slough present but the hypertrophic granulation tissue does not seem to have reaccumulated. Electronic Signature(s) Signed: 05/20/2023 10:34:36 AM By: Duanne Guess MD FACS Previous Signature: 05/20/2023 10:26:04 AM Version By:  Duanne Guess MD FACS Entered By: Duanne Guess on 05/20/2023 10:34:36 -------------------------------------------------------------------------------- Physical Exam Details Patient Name: Date of Service: [name] 05/20/2023 8:45 A M Medical Record Number: [id number] Patient Account Number: [id number] Date of Birth/Sex: Treating RN: 23-Oct-1970 (52 y.o. M) Primary Care Provider: [name] Other Clinician: Referring Provider: Treating Provider/Extender: Whitney Muse, Odette Horns Weeks in Treatment: 3 Constitutional Slightly hypertensive. . . . no acute distress. Respiratory Normal work of breathing on room air.. Notes 05/20/2023: The wounds continue to improve. The anterior tibial wounds are much smaller with slough and eschar present. The lateral foot wound also has some slough present but the hypertrophic granulation tissue does not seem to have reaccumulated. Electronic Signature(s) Signed: 05/20/2023 10:35:10 AM By: Duanne Guess MD FACS Entered By: Duanne Guess on 05/20/2023 10:35:09 Todd Mccoy ([id number]) [id number] Page 5 of 13 -------------------------------------------------------------------------------- Physician Orders  Details Patient Name: Date of Service: [name] [place] THA N S. 05/20/2023 8:45 A M Medical Record Number: [id number] Patient Account Number: [id number] Date of Birth/Sex: Treating RN: 06-Mar-1971 (52 y.o. Todd Mccoy Primary Care Provider: [name] Other Clinician: Referring Provider: Treating Provider/Extender: Camillo Flaming Weeks in Treatment: 3 The following information was scribed by: Samuella Bruin The information was scribed for: Duanne Guess Verbal / Phone Orders: No Diagnosis Coding ICD-10 Coding Code Description L97.512 Non-pressure chronic ulcer of other part of right foot with fat layer exposed L97.812 Non-pressure  chronic ulcer of other part of right lower leg with fat layer exposed E11.622 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 ulcer E11.621 Type 2 diabetes mellitus with foot ulcer I50.42 Chronic combined systolic (congestive) and diastolic (congestive) heart failure N18.6 End stage renal disease Follow-up Appointments ppointment in 2 weeks. - Dr. Lady Gary RM 1 Return A Anesthetic (In clinic) Topical Lidocaine 4% applied to wound bed Bathing/ Shower/ Hygiene May shower and wash wound with soap and water. - when changing dressing Home Health No change in wound care orders this week; continue Home Health for wound care. May utilize formulary equivalent dressing for wound treatment orders unless otherwise specified. Other Home Health Orders/Instructions: - [facility] Wound #10 - Lower Leg Wound Laterality: Right, Anterior Cleanser: Soap and Water 2 x Per Week/30 Days Discharge Instructions: May shower and wash wound with dial antibacterial soap and water prior to dressing change. Cleanser: Wound Cleanser 2 x Per Week/30 Days Discharge Instructions: Cleanse the wound with wound cleanser prior to applying a clean dressing using gauze sponges, not tissue or cotton balls. Peri-Wound Care: Sween Lotion (Moisturizing lotion) 2 x Per Week/30 Days Discharge Instructions: Apply moisturizing lotion as directed Prim Dressing: Maxorb Extra Ag+ Alginate Dressing, 2x2 (in/in) 2 x Per Week/30 Days ary Discharge Instructions: Apply to wound bed as instructed Secondary Dressing: Woven Gauze Sponge, Non-Sterile 4x4 in 2 x Per Week/30 Days Discharge Instructions: Apply over primary dressing as directed. Secured With: 14M Medipore H Soft Cloth Surgical T ape, 4 x 10 (in/yd) 2 x Per Week/30 Days Discharge Instructions: Secure with tape as directed. Compression Wrap: Kerlix Roll 4.5x3.1 (in/yd) 2 x Per Week/30 Days Discharge Instructions: Apply Kerlix and Coban compression as directed. Compression Wrap:  Coban Self-Adherent Wrap 4x5 (in/yd) 2 x Per Week/30 Days Discharge Instructions: Apply over Kerlix as directed. Wound #11 - Lower Leg Wound Laterality: Right, Anterior, Proximal Cleanser: Soap and Water 2 x Per Week/30 Days Discharge Instructions: May shower and wash wound with dial antibacterial soap and water prior to dressing change. Cleanser: Wound Cleanser 2 x Per Week/30 Days Todd Mccoy, Todd Mccoy ([id number]) [id number] Page 6 of 13 Discharge Instructions: Cleanse the wound with wound cleanser prior to applying a clean dressing using gauze sponges, not tissue or cotton balls. Peri-Wound Care: Sween Lotion (Moisturizing lotion) 2 x Per Week/30 Days Discharge Instructions: Apply moisturizing lotion as directed Prim Dressing: Maxorb Extra Ag+ Alginate Dressing, 2x2 (in/in) 2 x Per Week/30 Days ary Discharge Instructions: Apply to wound bed as instructed Secondary Dressing: Woven Gauze Sponge, Non-Sterile 4x4 in 2 x Per Week/30 Days Discharge Instructions: Apply over primary dressing as directed. Secured With: 14M Medipore H Soft Cloth Surgical T ape, 4 x 10 (in/yd) 2 x Per Week/30 Days Discharge Instructions: Secure with tape as directed. Compression Wrap: Kerlix Roll 4.5x3.1 (in/yd) 2 x Per Week/30 Days Discharge Instructions: Apply Kerlix and Coban compression as directed. Compression Wrap: Coban Self-Adherent Wrap  4x5 (in/yd) 2 x Per Week/30 Days Discharge Instructions: Apply over Kerlix as directed. Wound #9 - T Fifth oe Wound Laterality: Right Cleanser: Soap and Water 2 x Per Week/30 Days Discharge Instructions: May shower and wash wound with dial antibacterial soap and water prior to dressing change. Cleanser: Wound Cleanser 2 x Per Week/30 Days Discharge Instructions: Cleanse the wound with wound cleanser prior to applying a clean dressing using gauze sponges, not tissue or cotton balls. Peri-Wound Care: Sween Lotion (Moisturizing lotion) 2 x Per  Week/30 Days Discharge Instructions: Apply moisturizing lotion as directed Prim Dressing: Maxorb Extra Ag+ Alginate Dressing, 2x2 (in/in) 2 x Per Week/30 Days ary Discharge Instructions: Apply to wound bed as instructed Secondary Dressing: Woven Gauze Sponge, Non-Sterile 4x4 in 2 x Per Week/30 Days Discharge Instructions: Apply over primary dressing as directed. Secured With: 81M Medipore H Soft Cloth Surgical T ape, 4 x 10 (in/yd) 2 x Per Week/30 Days Discharge Instructions: Secure with tape as directed. Patient Medications llergies: No Known Allergies A Notifications Medication Indication Start End 05/20/2023 lidocaine DOSE topical 4 % cream - cream topical Electronic Signature(s) Signed: 05/20/2023 12:51:55 PM By: Duanne Guess MD FACS Entered By: Duanne Guess on 05/20/2023 10:35:37 -------------------------------------------------------------------------------- Problem List Details Patient Name: Date of Service: [name] 05/20/2023 8:45 A M Medical Record Number: [id number] Patient Account Number: [id number] Date of Birth/Sex: Treating RN: 06-Feb-1971 (52 y.o. Tammy Sours Primary Care Provider: [name] Other Clinician: Referring Provider: Treating Provider/Extender: Camillo Flaming Weeks in Treatment: 3 Active Problems ICD-10 Encounter Code Description Active Date MDM KASH, MCLINN ([id number]) [id number] Page 7 of 13 Code Description Active Date MDM Diagnosis L97.512 Non-pressure chronic ulcer of other part of right foot with fat layer exposed 04/27/2023 No Yes L97.812 Non-pressure chronic ulcer of other part of right lower leg with fat layer 04/27/2023 No Yes exposed E11.622 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 ulcer 04/27/2023 No Yes E11.621 Type 2 diabetes mellitus with foot ulcer 04/27/2023 No Yes I50.42 Chronic combined systolic (congestive) and diastolic (congestive) heart  failure 04/27/2023 No Yes N18.6 End stage renal disease 04/27/2023 No Yes Inactive Problems Resolved Problems Electronic Signature(s) Signed: 05/20/2023 10:19:23 AM By: Duanne Guess MD FACS Entered By: Duanne Guess on 05/20/2023 10:19:23 -------------------------------------------------------------------------------- Progress Note Details Patient Name: Date of Service: [name] 05/20/2023 8:45 A M Medical Record Number: [id number] Patient Account Number: [id number] Date of Birth/Sex: Treating RN: 1971/06/09 (52 y.o. M) Primary Care Provider: [name] Other Clinician: Referring Provider: Treating Provider/Extender: Camillo Flaming Weeks in Treatment: 3 Subjective Chief Complaint Information obtained from Patient 11/20/2019; patient is here with for review of multiple wounds over the right lower leg, right great and right second toe 09/08/2021; patient presents for wound to the right lateral foot 04/27/2023: wound on right lateral foot (5th metatarsal head level) and multiple RLE wounds History of Present Illness (HPI) ADMISSION 11/20/2019; this is a 52 year old man with poorly controlled type 2 diabetes. Roughly 3 to 4 weeks ago he noted swelling of his leg open wounds with draining sores. This happened fairly suddenly. He was seen in the ER on 11/07/2019 noted to have wounds of the right lower extremity x2 weeks at that point. An x-ray of the area was negative one of the wounds was cultured showing MSSA and group B strep. He was noted to have multiple wounds. He was given a course of doxycycline for 7 days white  count was 8.4 hemoglobin 9.7. Since then he has been washing the leg and applying dry gauze. He is here in the clinic for review of this. 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 At that point  his culture was MRSA. No no 11/27/19-Patient returns after starting in the clinic for open leg wounds on his right and his right second toe. We have been applying silver alginate and 3 layer compression on the right. Patient completed course of doxycycline. The wounds are looking slightly smaller, the right posterior wound appears to have healed 6/8; patient is here in follow-up for open wounds on his right leg and his right second toe. X-ray I did last time was strangely negative. I gave him a course of Keflex which she is completed the area on the tip of the second toe is actually close over. All of the wounds on his right anterior lateral and posterior leg are considerably better. There is still open wounds on the right lateral and right posterior but they are measuring better. These were felt to be abscesses for which she was hospitalized in early May. Cultures originally showed MSSA and group B strep 6/25; patient is here for review of wounds on the right lower leg which were felt to be abscesses. He also had an area on his left second toe when he came in this is closed over. Almost all the areas on the right leg are closed 2 small open areas remain. We use silver alginate to these wounds and let them wear his Todd Mccoy, Todd Mccoy ([id number]) [id number] Page 8 of 13 own stockings. Should be closed the next time he is here Readmission 09/08/2021 Mr. 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 Chronic kidney disease stage IV and combined chronic systolic and diastolic heart failure that presents to the clinic for a 1 month history of open wound to the right lateral foot. He states he visited the ED on 08/18/2021 for this issue and was prescribed doxycycline. He states that the wound became slightly smaller after taking this. He has been using AandE ointment to  the wound bed. He denies signs of infection. 3/23; patient presents for follow-up. He had a wound culture at last clinic visit that showed moderate Staph aureus and was started on Augmentin. He has been using gentamicin ointment to the wound bed. He currently denies systemic signs of infection. 3/28; patient presents for follow-up. He has completed his course of antibiotics. He continues to use gentamicin with Hydrofera Blue to the wound bed. He has no issues or complaints today. READMISSION 04/27/2023 ***ABIs 11/2022*** +-------+-----------+-----------+------------+------------+ ABI/TBIT oday's ABIT oday's TBIPrevious ABIPrevious TBI +-------+-----------+-----------+------------+------------+ Right 1.21 0.98 1.07 0  +-------+-----------+-----------+------------+------------+ Left BKA BKA 1.07   +-------+-----------+-----------+------------+------------+ He returns today with a wound over the lateral aspect of his fifth toe and metatarsal head as well as some smaller wounds on his anterior tibial surface. He has had multiple wounds on the anterior tibial surface that appear to have recently healed. Two remain open. He has home health and they have been applying Medihoney to the sites. 05/06/2023: All of the wounds are smaller today. There is slough and eschar on the anterior tibial wounds and some slough on the lateral foot wound, along with some hypertrophic granulation  tissue. 05/20/2023: The wounds continue to improve. The anterior tibial wounds are much smaller with slough and eschar present. The lateral foot wound also has some slough present but the hypertrophic granulation tissue does not seem to have reaccumulated. Patient History Information obtained from Patient, Chart. Family History Diabetes - Mother, Hypertension - Mother, No family history of Cancer, Heart Disease, Hereditary Spherocytosis, Kidney Disease, Lung Disease, Seizures, Stroke, Thyroid Problems,  Tuberculosis. Social History Current every day smoker - 1/2 ppd, Marital Status - Single, Alcohol Use - Never, Drug Use - No History, Caffeine Use - Daily - soda, tea. Medical History Eyes Denies history of Cataracts, Glaucoma, Optic Neuritis Ear/Nose/Mouth/Throat Denies history of Chronic sinus problems/congestion, Middle ear problems Hematologic/Lymphatic Patient has history of Anemia Denies history of Hemophilia, Human Immunodeficiency Virus, Lymphedema, Sickle Cell Disease Cardiovascular Patient has history of Congestive Heart Failure - EF 40-45%, Hypertension, Peripheral Arterial Disease Denies history of Angina, Arrhythmia, Coronary Artery Disease, Deep Vein Thrombosis, Hypotension, Myocardial Infarction, Peripheral Venous Disease, Phlebitis, Vasculitis Gastrointestinal Denies history of Cirrhosis , Colitis, Crohns, Hepatitis A, Hepatitis B, Hepatitis C Endocrine Patient has history of Type II Diabetes Denies history of Type I Diabetes Genitourinary Patient has history of 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 Immunological Denies history of Lupus Erythematosus, Raynauds, Scleroderma Integumentary (Skin) Denies history of History of Burn Musculoskeletal Patient has history 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 - scrotum exploration. - Left BKA 5/22. 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 no acute distress. Vitals Time Taken: 9:22 AM, Temperature: 97.8 F, Pulse: 87 bpm, Respiratory Rate: 16 breaths/min, Blood Pressure: 129/99 mmHg. Respiratory Normal work of breathing on room air.. General Notes: 05/20/2023: The wounds continue to improve. The anterior tibial wounds are much smaller with slough and eschar present. The lateral foot wound also has some slough present but the hypertrophic granulation tissue does not seem to have reaccumulated. Integumentary (Hair, Skin) Wound #10 status is Open. Original cause of wound was Gradually Appeared. The date acquired was: 01/03/2023. The wound has been in treatment 3 weeks. The wound is located on the Right,Anterior Lower Leg. The wound measures 0.9cm length x 0.7cm width x 0.1cm depth; 0.495cm^2 area and 0.049cm^3 volume. There is Fat Layer (Subcutaneous Tissue) exposed. There is no tunneling or undermining noted. There is a medium amount of serosanguineous drainage noted. The wound margin is distinct with the outline attached to the wound base. There is large (67-100%) pink granulation within the wound bed. There is a small (1- 33%) amount of necrotic tissue within the wound bed including Adherent Slough. The periwound skin appearance had no abnormalities noted for texture. The periwound skin appearance had no abnormalities noted for moisture. The periwound skin appearance had no abnormalities noted for color. Periwound temperature was noted as No Abnormality. Wound #11 status is Open. Original cause of wound was Gradually Appeared. The date acquired was: 01/03/2023. The wound has been in treatment 3 weeks. The wound is located on the Right,Proximal,Anterior Lower Leg. The wound measures 0.5cm length x 0.5cm  width x 0.1cm depth; 0.196cm^2 area and 0.02cm^3 volume. There is Fat Layer (Subcutaneous Tissue) exposed. There is no  tunneling or undermining noted. There is a medium amount of serosanguineous drainage noted. The wound margin is distinct with the outline attached to the wound base. There is large (67-100%) pink granulation within the wound bed. There is a small (1-33%) amount of necrotic tissue within the wound bed including Eschar and Adherent Slough. The periwound skin appearance had no abnormalities noted for texture. The periwound skin appearance had no abnormalities noted for moisture. The periwound skin appearance had no abnormalities noted for color. Periwound temperature was noted as No Abnormality. Wound #9 status is Open. Original cause of wound was Gradually Appeared. The date acquired was: 01/03/2023. The wound has been in treatment 3 weeks. The wound is located on the Right T Fifth. The wound measures 2.2cm length x 0.6cm width x 0.1cm depth; 1.037cm^2 area and 0.104cm^3 volume. There is Fat oe Layer (Subcutaneous Tissue) exposed. There is no tunneling or undermining noted. There is a medium amount of serosanguineous drainage noted. The wound margin is distinct with the outline attached to the wound base. There is large (67-100%) red, hyper - granulation within the wound bed. There is a small (1-33%) amount of necrotic tissue within the wound bed including Adherent Slough. The periwound skin appearance had no abnormalities noted for color. The periwound skin appearance exhibited: Callus, Dry/Scaly. Periwound temperature was noted as No Abnormality. 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 There was a Excisional Skin/Subcutaneous Tissue Debridement with a total area of 0.49 sq cm performed by Duanne Guess, MD. With the following instrument(s): Curette to remove Non-Viable tissue/material. Material removed includes Eschar, Subcutaneous Tissue, and Slough after achieving pain control using Lidocaine 5% topical ointment. No specimens were taken. A time out was conducted at 10:04, prior to the start of the procedure. A Minimum amount of bleeding was controlled with Pressure. The procedure was tolerated well. Post Debridement Measurements: 0.9cm length x 0.7cm width x 0.1cm depth; 0.049cm^3 volume. Character of Wound/Ulcer Post Debridement is improved. Post procedure Diagnosis Wound #10: Same as Pre-Procedure Wound #11 Pre-procedure diagnosis of Wound #11 is a Lymphedema located on the Right,Proximal,Anterior Lower Leg . There was a Excisional Skin/Subcutaneous Tissue Debridement with a total area of 0.2 sq cm performed by Duanne Guess, MD. With the following instrument(s): Curette to remove Non-Viable tissue/material. Material removed includes Eschar, Subcutaneous Tissue, and Slough after achieving pain control using Lidocaine 5% topical ointment. No specimens were taken. A time out was conducted at 10:04, prior to the start of the procedure. A Minimum amount of bleeding was controlled with Pressure. The procedure was tolerated well. Post Debridement Measurements: 0.5cm length x 0.5cm width x 0.1cm depth; 0.02cm^3 volume. Character of Wound/Ulcer Post Debridement is improved. Post procedure Diagnosis Wound #11: Same as Pre-Procedure Todd Mccoy, Todd Mccoy ([id number]) [id number] Page 10 of 13 Wound #9 Pre-procedure diagnosis of Wound #9 is a Diabetic Wound/Ulcer of the Lower Extremity located on the Right T Fifth .Severity of Tissue Pre Debridement is: oe Fat layer exposed. There was a  Excisional  Skin/Subcutaneous Tissue Debridement with a total area of 1.04 sq cm performed by Duanne Guess, MD. With the following instrument(s): Curette to remove Non-Viable tissue/material. Material removed includes Subcutaneous Tissue and Slough and after achieving pain control using Lidocaine 5% topical ointment. No specimens were taken. A time out was conducted at 10:04, prior to the start of the procedure. A Minimum amount of bleeding was controlled with Pressure. The procedure was tolerated well. Post Debridement Measurements: 2.2cm length x 0.6cm width x 0.1cm depth; 0.104cm^3 volume. Character of Wound/Ulcer Post Debridement is improved. Severity of Tissue Post Debridement is: Fat layer exposed. Post procedure Diagnosis Wound #9: Same as Pre-Procedure Plan Follow-up Appointments: Return Appointment in 2 weeks. - Dr. Lady Gary RM 1 Anesthetic: (In clinic) Topical Lidocaine 4% applied to wound bed Bathing/ Shower/ Hygiene: May shower and wash wound with soap and water. - when changing dressing Home Health: No change in wound care orders this week; continue Home Health for wound care. May utilize formulary equivalent dressing for wound treatment orders unless otherwise specified. Other Home Health Orders/Instructions: - [facility] The following medication(s) was prescribed: lidocaine topical 4 % cream cream topical was prescribed at facility WOUND #10: - Lower Leg Wound Laterality: Right, Anterior Cleanser: Soap and Water 2 x Per Week/30 Days Discharge Instructions: May shower and wash wound with dial antibacterial soap and water prior to dressing change. Cleanser: Wound Cleanser 2 x Per Week/30 Days Discharge Instructions: Cleanse the wound with wound cleanser prior to applying a clean dressing using gauze sponges, not tissue or cotton balls. Peri-Wound Care: Sween Lotion (Moisturizing lotion) 2 x Per Week/30 Days Discharge Instructions: Apply moisturizing lotion as directed Prim Dressing:  Maxorb Extra Ag+ Alginate Dressing, 2x2 (in/in) 2 x Per Week/30 Days ary Discharge Instructions: Apply to wound bed as instructed Secondary Dressing: Woven Gauze Sponge, Non-Sterile 4x4 in 2 x Per Week/30 Days Discharge Instructions: Apply over primary dressing as directed. Secured With: 32M Medipore H Soft Cloth Surgical T ape, 4 x 10 (in/yd) 2 x Per Week/30 Days Discharge Instructions: Secure with tape as directed. Com pression Wrap: Kerlix Roll 4.5x3.1 (in/yd) 2 x Per Week/30 Days Discharge Instructions: Apply Kerlix and Coban compression as directed. Com pression Wrap: Coban Self-Adherent Wrap 4x5 (in/yd) 2 x Per Week/30 Days Discharge Instructions: Apply over Kerlix as directed. WOUND #11: - Lower Leg Wound Laterality: Right, Anterior, Proximal Cleanser: Soap and Water 2 x Per Week/30 Days Discharge Instructions: May shower and wash wound with dial antibacterial soap and water prior to dressing change. Cleanser: Wound Cleanser 2 x Per Week/30 Days Discharge Instructions: Cleanse the wound with wound cleanser prior to applying a clean dressing using gauze sponges, not tissue or cotton balls. Peri-Wound Care: Sween Lotion (Moisturizing lotion) 2 x Per Week/30 Days Discharge Instructions: Apply moisturizing lotion as directed Prim Dressing: Maxorb Extra Ag+ Alginate Dressing, 2x2 (in/in) 2 x Per Week/30 Days ary Discharge Instructions: Apply to wound bed as instructed Secondary Dressing: Woven Gauze Sponge, Non-Sterile 4x4 in 2 x Per Week/30 Days Discharge Instructions: Apply over primary dressing as directed. Secured With: 32M Medipore H Soft Cloth Surgical T ape, 4 x 10 (in/yd) 2 x Per Week/30 Days Discharge Instructions: Secure with tape as directed. Com pression Wrap: Kerlix Roll 4.5x3.1 (in/yd) 2 x Per Week/30 Days Discharge Instructions: Apply Kerlix and Coban compression as directed. Com pression Wrap: Coban Self-Adherent Wrap 4x5 (in/yd) 2 x Per Week/30 Days Discharge  Instructions: Apply over Kerlix as directed. WOUND #9: - T Fifth Wound Laterality:  Right oe Cleanser: Soap and Water 2 x Per Week/30 Days Discharge Instructions: May shower and wash wound with dial antibacterial soap and water prior to dressing change. Cleanser: Wound Cleanser 2 x Per Week/30 Days Discharge Instructions: Cleanse the wound with wound cleanser prior to applying a clean dressing using gauze sponges, not tissue or cotton balls. Peri-Wound Care: Sween Lotion (Moisturizing lotion) 2 x Per Week/30 Days Discharge Instructions: Apply moisturizing lotion as directed Prim Dressing: Maxorb Extra Ag+ Alginate Dressing, 2x2 (in/in) 2 x Per Week/30 Days ary Discharge Instructions: Apply to wound bed as instructed Secondary Dressing: Woven Gauze Sponge, Non-Sterile 4x4 in 2 x Per Week/30 Days Discharge Instructions: Apply over primary dressing as directed. Secured With: 81M Medipore H Soft Cloth Surgical T ape, 4 x 10 (in/yd) 2 x Per Week/30 Days Discharge Instructions: Secure with tape as directed. 05/20/2023: The wounds continue to improve. The anterior tibial wounds are much smaller with slough and eschar present. The lateral foot wound also has some slough present but the hypertrophic granulation tissue does not seem to have reaccumulated. I used a curette to debride eschar, slough, and subcutaneous tissue from the anterior tibial wounds. I debrided slough and subcutaneous tissue from the lateral foot wound. We will continue silver alginate to all sites with Kerlix and Coban wrap. Follow-up in 2 weeks; the patient's home health agency is changing his dressing in the off weeks. Todd Mccoy, Todd Mccoy ([id number]) [id number] Page 11 of 13 Electronic Signature(s) Signed: 05/20/2023 10:37:49 AM By: Duanne Guess MD FACS Entered By: Duanne Guess on 05/20/2023 10:37:49 -------------------------------------------------------------------------------- HxROS  Details Patient Name: Date of Service: [name] 05/20/2023 8:45 A M Medical Record Number: [id number] Patient Account Number: [id number] Date of Birth/Sex: Treating RN: 1971-06-07 (52 y.o. 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 Ear/Nose/Mouth/Throat Medical History: Negative for: Chronic sinus problems/congestion; Middle ear problems Hematologic/Lymphatic Medical History: Positive for: Anemia Negative for: Hemophilia; Human Immunodeficiency Virus; Lymphedema; Sickle Cell Disease Cardiovascular Medical History: Positive for: Congestive Heart Failure - EF 40-45%; Hypertension; Peripheral Arterial Disease Negative for: Angina; Arrhythmia; Coronary Artery Disease; Deep Vein Thrombosis; Hypotension; Myocardial Infarction; Peripheral Venous Disease; Phlebitis; Vasculitis Gastrointestinal Medical History: Negative for: Cirrhosis ; Colitis; Crohns; Hepatitis A; Hepatitis B; Hepatitis C Endocrine Medical History: Positive for: Type II Diabetes Negative for: Type I Diabetes Time with diabetes: 15 yrs Treated with: Insulin Blood sugar tested every day: Yes Tested : daily Blood sugar testing results: Breakfast: 170 Genitourinary Medical History: Positive for: 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 Past Medical History Notes: on dialysis Todd Mccoy, Todd Mccoy ([id number]) [id number] Page 12 of 13 Immunological Medical History: Negative for: Lupus Erythematosus; Raynauds;  Scleroderma Integumentary (Skin) Medical History: Negative for: History of Burn Musculoskeletal Medical History: Positive for: 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 Negative for: Gout; Rheumatoid Arthritis; Osteoarthritis Neurologic Medical History: Positive for: Neuropathy Negative for: Dementia; Quadriplegia; Paraplegia; Seizure Disorder Oncologic Medical History: Negative for: Received Chemotherapy; Received Radiation Psychiatric Medical History: Negative for: Anorexia/bulimia; Confinement Anxiety Immunizations Pneumococcal Vaccine: Received Pneumococcal Vaccination: No Implantable Devices None Hospitalization / Surgery History Type of Hospitalization/Surgery IandD abscess right flank scrotum exploration Left BKA  5/22 Family and Social History Cancer: No; Diabetes: Yes - Mother; Heart Disease: No; Hereditary Spherocytosis: No; Hypertension: Yes - Mother; Kidney Disease: No; Lung Disease: No; Seizures: No; Stroke: No; Thyroid Problems: No; Tuberculosis: No; Current every day smoker - 1/2 ppd; Marital Status - Single; Alcohol Use: Never; Drug Use: No History; Caffeine Use: Daily - soda, tea; Financial Concerns: No; Food, Clothing or Shelter Needs: No; Support System Lacking: No; Transportation Concerns: No Electronic Signature(s) Signed: 05/20/2023 12:51:55 PM By: Duanne Guess MD FACS Entered By: Duanne Guess on 05/20/2023 10:34:44 -------------------------------------------------------------------------------- SuperBill Details Patient Name: Date of Service: [name] 05/20/2023 Medical Record Number: [id number] Patient Account Number: [id number] Date of Birth/Sex: Treating RN: Nov 04, 1970 (52 y.o. M) Primary Care Provider: [name] Other Clinician: Referring Provider: Treating Provider/Extender: Camillo Flaming Weeks in Treatment: 3 Diagnosis [address] Todd Mccoy, Todd Mccoy ([id number])  [id number] Page 13 of 13 ICD-10 Codes Code Description [phone] Non-pressure chronic ulcer of other part of right foot with fat layer exposed L97.812 Non-pressure chronic ulcer of other part of right lower leg with fat layer exposed E11.622 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 ulcer E11.621 Type 2 diabetes mellitus with foot ulcer I50.42 Chronic combined systolic (congestive) and diastolic (congestive) heart failure N18.6 End stage renal disease Facility Procedures : CPT4 Code: [id number] Description: 11042 - DEB SUBQ TISSUE 20 SQ CM/< ICD-10 Diagnosis Description L97.512 Non-pressure chronic ulcer of other part of right foot with fat layer exposed L97.812 Non-pressure chronic ulcer of other part of right lower leg with fat layer exp Modifier: osed Quantity: 1 Physician Procedures : CPT4 Code Description Modifier [id number] - WC PHYS LEVEL 3 - EST PT ICD-10 Diagnosis Description L97.512 Non-pressure chronic ulcer of other part of right foot with fat layer exposed L97.812 Non-pressure chronic ulcer of other part of right lower  leg with fat layer exposed E11.622 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 ulcer E11.621 Type 2 diabetes mellitus with foot ulcer Quantity: 1 : [id number] 11042 - WC PHYS SUBQ TISS 20 SQ CM ICD-10 Diagnosis Description L97.512 Non-pressure chronic ulcer of other part of right foot with fat layer exposed L97.812 Non-pressure chronic ulcer of other part of right lower leg with fat layer exposed Quantity: 1 Electronic Signature(s) Signed: 05/20/2023 10:38:30 AM By: Duanne Guess MD FACS Entered By: Duanne Guess on 05/20/2023 10:38:30

## 2023-05-20 NOTE — Anesthesia Preprocedure Evaluation (Signed)
Anesthesia Evaluation  Patient identified by MRN, date of birth, ID band Patient awake    Reviewed: Allergy & Precautions, H&P , NPO status , Patient's Chart, lab work & pertinent test results  Airway Mallampati: II  TM Distance: >3 FB Neck ROM: Full    Dental no notable dental hx.    Pulmonary Current Smoker   Pulmonary exam normal breath sounds clear to auscultation       Cardiovascular hypertension, pulmonary hypertension+ Peripheral Vascular Disease and +CHF  Normal cardiovascular exam Rhythm:Regular Rate:Normal  EF 40%, Mod RV dysfucntion, severe pHTN, restrictive diastolic dysfunction    Neuro/Psych   Anxiety     negative neurological ROS     GI/Hepatic negative GI ROS, Neg liver ROS,,,  Endo/Other  diabetes, Type 2    Renal/GU ESRFRenal disease  negative genitourinary   Musculoskeletal negative musculoskeletal ROS (+)    Abdominal   Peds negative pediatric ROS (+)  Hematology  (+) Blood dyscrasia, anemia   Anesthesia Other Findings Diabetic retinopathy   Reproductive/Obstetrics negative OB ROS                              Anesthesia Physical Anesthesia Plan  ASA: 4  Anesthesia Plan: Regional   Post-op Pain Management: Regional block*   Induction: Intravenous  PONV Risk Score and Plan: 1 and Ondansetron, Dexamethasone, TIVA and Treatment may vary due to age or medical condition  Airway Management Planned: Natural Airway and Simple Face Mask  Additional Equipment:   Intra-op Plan:   Post-operative Plan: Extubation in OR  Informed Consent: I have reviewed the patients History and Physical, chart, labs and discussed the procedure including the risks, benefits and alternatives for the proposed anesthesia with the patient or authorized representative who has indicated his/her understanding and acceptance.     Dental advisory given  Plan Discussed with:  CRNA  Anesthesia Plan Comments: (PAT note by Antionette Poles, PA-C:  52 year old male with pertinent history including poorly controlled hypertension, insulin-dependent DM2 (A1c 5.6 on 02/06/2023), combined heart failure, ESRD on HD, blindness, PVD s/p left BKA.  Recent admission 8/2 through 02/14/2023 after presenting with 3 days of diarrhea, decreased urine output, decreased oral intake. Patient noted to be volume overloaded on examination and hypotensive. Patient noted to be in acute on chronic renal failure requiring CVVHD. Patient also required pressors for shock felt likely combination of hypovolemic and septic shock. Patient received a course of IV antibiotics for possible UTI.  He ultimately progressed to requiring HD had [facility] placed and was set up with outpatient dialysis on Tuesday Thursday Saturday.  Echo done during admission showed EF 40 to 45%, grade 3 DD, moderately reduced RV systolic function with RVSP 67.7 mmHg, moderate circumference pericardial effusion, no significant valve abnormalities.  Cardiology was consulted.  Per note 02/08/2023 by Dr. Cristal Deer, "cath deferred this admission given progression of renal disease. High suspicion that marked volume overload may be etiology of failure, recommend rechecking echo in 3 mos. Given that it is unclear whether he will need long term HD at this time, will avoid contrast."  Prior to hospitalization patient was already following with cardiology for history of combined heart failure.  Prior echo in July 2023 showed EF of 38% with grade 3 DD.  PYP scan 02/2022 was equivocal for amyloidosis.  Nuclear stress 04/23/2021 was negative for ischemia or infarction.  Patient will need day of surgery labs and evaluation.  EKG 02/05/2023: Sinus bradycardia.  Nonspecific intraventricular conduction delay. Borderline T abnormalities, inferior leads  TTE 02/05/2023:  1. Left ventricular ejection fraction, by estimation, is 40 to 45%. The  left ventricle has mildly  decreased function. The left ventricle  demonstrates global hypokinesis. There is moderate left ventricular  hypertrophy. Left ventricular diastolic  parameters are consistent with Grade III diastolic dysfunction  (restrictive).   2. Right ventricular systolic function is moderately reduced. The right  ventricular size is moderately enlarged. There is severely elevated  pulmonary artery systolic pressure. The estimated right ventricular  systolic pressure is 67.7 mmHg.   3. Left atrial size was moderately dilated.   4. Right atrial size was moderately dilated.   5. Moderate pericardial effusion. The pericardial effusion is  circumferential. There is no definite evidence of cardiac tamponade by  echocardiogram (BP and HR noted above).   6. The mitral valve is normal in structure. Trivial mitral valve  regurgitation. No evidence of mitral stenosis.   7. The aortic valve is tricuspid. Aortic valve regurgitation is not  visualized. No aortic stenosis is present.   8. The inferior vena cava is dilated in size with <50% respiratory  variability, suggesting right atrial pressure of 15 mmHg.    )         Anesthesia Quick Evaluation

## 2023-05-23 ENCOUNTER — Other Ambulatory Visit: Payer: Self-pay

## 2023-05-23 ENCOUNTER — Other Ambulatory Visit (HOSPITAL_COMMUNITY): Payer: Self-pay

## 2023-05-23 ENCOUNTER — Ambulatory Visit (HOSPITAL_COMMUNITY)
Admission: RE | Admit: 2023-05-23 | Discharge: 2023-05-23 | Disposition: A | Discharge status: Home / Self Care | Payer: Medicare Other | Attending: Vascular Surgery | Admitting: Vascular Surgery

## 2023-05-23 ENCOUNTER — Encounter (HOSPITAL_COMMUNITY)
Admission: RE | Disposition: A | Discharge status: Home / Self Care | Payer: Self-pay | Source: Home / Self Care | Attending: Vascular Surgery

## 2023-05-23 ENCOUNTER — Telehealth: Payer: Self-pay | Admitting: Vascular Surgery

## 2023-05-23 ENCOUNTER — Ambulatory Visit (HOSPITAL_COMMUNITY): Payer: Medicare Other | Admitting: Physician Assistant

## 2023-05-23 ENCOUNTER — Ambulatory Visit (HOSPITAL_COMMUNITY): Payer: Self-pay | Admitting: Physician Assistant

## 2023-05-23 DIAGNOSIS — I132 Hypertensive heart and chronic kidney disease with heart failure and with stage 5 chronic kidney disease, or end stage renal disease: Secondary | ICD-10-CM | POA: Insufficient documentation

## 2023-05-23 DIAGNOSIS — E1122 Type 2 diabetes mellitus with diabetic chronic kidney disease: Secondary | ICD-10-CM | POA: Diagnosis not present

## 2023-05-23 DIAGNOSIS — Z992 Dependence on renal dialysis: Secondary | ICD-10-CM | POA: Insufficient documentation

## 2023-05-23 DIAGNOSIS — Z993 Dependence on wheelchair: Secondary | ICD-10-CM | POA: Diagnosis not present

## 2023-05-23 DIAGNOSIS — Z95828 Presence of other vascular implants and grafts: Secondary | ICD-10-CM | POA: Diagnosis not present

## 2023-05-23 DIAGNOSIS — Z89612 Acquired absence of left leg above knee: Secondary | ICD-10-CM | POA: Insufficient documentation

## 2023-05-23 DIAGNOSIS — E1151 Type 2 diabetes mellitus with diabetic peripheral angiopathy without gangrene: Secondary | ICD-10-CM | POA: Insufficient documentation

## 2023-05-23 DIAGNOSIS — Z794 Long term (current) use of insulin: Secondary | ICD-10-CM | POA: Insufficient documentation

## 2023-05-23 DIAGNOSIS — I509 Heart failure, unspecified: Secondary | ICD-10-CM

## 2023-05-23 DIAGNOSIS — N186 End stage renal disease: Secondary | ICD-10-CM | POA: Diagnosis present

## 2023-05-23 DIAGNOSIS — I5042 Chronic combined systolic (congestive) and diastolic (congestive) heart failure: Secondary | ICD-10-CM | POA: Diagnosis not present

## 2023-05-23 DIAGNOSIS — Z79899 Other long term (current) drug therapy: Secondary | ICD-10-CM | POA: Insufficient documentation

## 2023-05-23 HISTORY — PX: AV FISTULA PLACEMENT: SHX1204

## 2023-05-23 LAB — POCT I-STAT, CHEM 8
BUN: 48 mg/dL — ABNORMAL HIGH (ref 6–20)
Calcium, Ion: 0.92 mmol/L — ABNORMAL LOW (ref 1.15–1.40)
Chloride: 100 mmol/L (ref 98–111)
Creatinine, Ser: 3.8 mg/dL — ABNORMAL HIGH (ref 0.61–1.24)
Glucose, Bld: 195 mg/dL — ABNORMAL HIGH (ref 70–99)
HCT: 36 % — ABNORMAL LOW (ref 39.0–52.0)
Hemoglobin: 12.2 g/dL — ABNORMAL LOW (ref 13.0–17.0)
Potassium: 4.1 mmol/L (ref 3.5–5.1)
Sodium: 138 mmol/L (ref 135–145)
TCO2: 31 mmol/L (ref 22–32)

## 2023-05-23 LAB — GLUCOSE, CAPILLARY
Glucose-Capillary: 198 mg/dL — ABNORMAL HIGH (ref 70–99)
Glucose-Capillary: 186 mg/dL — ABNORMAL HIGH (ref 70–99)

## 2023-05-23 SURGERY — ARTERIOVENOUS (AV) FISTULA CREATION
Anesthesia: Regional | Laterality: Right

## 2023-05-23 MED ORDER — SODIUM CHLORIDE 0.9% FLUSH: 10.0000 mL | Freq: Two times a day (BID) | INTRAVENOUS | Status: DC

## 2023-05-23 MED ORDER — ORAL CARE MOUTH RINSE: 15.0000 mL | Freq: Once | OROMUCOSAL | Status: DC

## 2023-05-23 MED ORDER — LIDOCAINE-EPINEPHRINE (PF) 1.5 %-1:200000 IJ SOLN
INTRAMUSCULAR | Status: DC | PRN
  Administered 2023-05-23: 25 mL via PERINEURAL

## 2023-05-23 MED ORDER — CEFAZOLIN SODIUM-DEXTROSE 2-4 GM/100ML-% IV SOLN
2.0000 g | INTRAVENOUS | Status: AC
  Administered 2023-05-23: 2 g via INTRAVENOUS
  Filled 2023-05-23: qty 100

## 2023-05-23 MED ORDER — HEPARIN 6000 UNIT IRRIGATION SOLUTION
Status: AC
  Filled 2023-05-23: qty 500

## 2023-05-23 MED ORDER — CHLORHEXIDINE GLUCONATE 4 % EX SOLN: 60.0000 mL | Freq: Once | CUTANEOUS | Status: DC

## 2023-05-23 MED ORDER — ACETAMINOPHEN 10 MG/ML IV SOLN: 1000.0000 mg | Freq: Once | INTRAVENOUS | Status: DC | PRN

## 2023-05-23 MED ORDER — FENTANYL CITRATE (PF) 250 MCG/5ML IJ SOLN
INTRAMUSCULAR | Status: AC
  Filled 2023-05-23: qty 5

## 2023-05-23 MED ORDER — HEPARIN 6000 UNIT IRRIGATION SOLUTION
Status: DC | PRN
  Administered 2023-05-23: 1

## 2023-05-23 MED ORDER — DROPERIDOL 2.5 MG/ML IJ SOLN
0.6250 mg | Freq: Once | INTRAMUSCULAR | Status: DC | PRN
Start: 2023-05-23 — End: 2023-05-23

## 2023-05-23 MED ORDER — 0.9 % SODIUM CHLORIDE (POUR BTL) OPTIME
TOPICAL | Status: DC | PRN
  Administered 2023-05-23: 1000 mL

## 2023-05-23 MED ORDER — OXYCODONE-ACETAMINOPHEN 5-325 MG PO TABS
1.0000 | ORAL_TABLET | Freq: Four times a day (QID) | ORAL | 0 refills | Status: DC | PRN
  Filled 2023-05-23: qty 12, 3d supply, fill #0

## 2023-05-23 MED ORDER — OXYCODONE HCL 5 MG PO TABS: 5.0000 mg | ORAL_TABLET | Freq: Once | ORAL | Status: DC | PRN

## 2023-05-23 MED ORDER — DEXMEDETOMIDINE HCL IN NACL 400 MCG/100ML IV SOLN
INTRAVENOUS | Status: DC | PRN
  Administered 2023-05-23: 10 ug via INTRAVENOUS

## 2023-05-23 MED ORDER — FENTANYL CITRATE (PF) 100 MCG/2ML IJ SOLN: 25.0000 ug | INTRAMUSCULAR | Status: DC | PRN

## 2023-05-23 MED ORDER — OXYCODONE HCL 5 MG/5ML PO SOLN: 5.0000 mg | Freq: Once | ORAL | Status: DC | PRN

## 2023-05-23 MED ORDER — PROPOFOL 500 MG/50ML IV EMUL
INTRAVENOUS | Status: DC | PRN
  Administered 2023-05-23: 25 ug/kg/min via INTRAVENOUS

## 2023-05-23 MED ORDER — CHLORHEXIDINE GLUCONATE 0.12 % MT SOLN
15.0000 mL | Freq: Once | OROMUCOSAL | Status: DC
  Filled 2023-05-23: qty 15

## 2023-05-23 MED ORDER — HEPARIN SODIUM (PORCINE) 1000 UNIT/ML IJ SOLN
INTRAMUSCULAR | Status: DC | PRN
  Administered 2023-05-23: 2000 [IU] via INTRAVENOUS

## 2023-05-23 MED ORDER — SODIUM CHLORIDE 0.9 % IV SOLN: INTRAVENOUS | Status: DC | PRN

## 2023-05-23 SURGICAL SUPPLY — 39 items
APPLIER CLIP 9.375 MED OPEN (MISCELLANEOUS) ×1
APPLIER CLIP 9.375 SM OPEN (CLIP) ×1
ARMBAND PINK RESTRICT EXTREMIT (MISCELLANEOUS) ×1 IMPLANT
BAG COUNTER SPONGE SURGICOUNT (BAG) ×1 IMPLANT
BLADE CLIPPER SURG (BLADE) ×1 IMPLANT
BNDG ELASTIC 4INX 5YD STR LF (GAUZE/BANDAGES/DRESSINGS) IMPLANT
BNDG ELASTIC 4X5.8 VLCR STR LF (GAUZE/BANDAGES/DRESSINGS) ×1 IMPLANT
CANISTER SUCT 3000ML PPV (MISCELLANEOUS) ×1 IMPLANT
CLIP APPLIE 9.375 MED OPEN (MISCELLANEOUS) ×1 IMPLANT
CLIP APPLIE 9.375 SM OPEN (CLIP) ×1 IMPLANT
CLIP TI MEDIUM 6 (CLIP) ×2 IMPLANT
CLIP TI WIDE RED SMALL 6 (CLIP) ×1 IMPLANT
COVER PROBE W GEL 5X96 (DRAPES) ×1 IMPLANT
DERMABOND ADVANCED .7 DNX12 (GAUZE/BANDAGES/DRESSINGS) ×1 IMPLANT
ELECT REM PT RETURN 9FT ADLT (ELECTROSURGICAL) ×1
ELECTRODE REM PT RTRN 9FT ADLT (ELECTROSURGICAL) ×1 IMPLANT
GLOVE BIOGEL PI IND STRL 8 (GLOVE) ×1 IMPLANT
GOWN STRL REUS W/ TWL LRG LVL3 (GOWN DISPOSABLE) ×2 IMPLANT
GOWN STRL REUS W/TWL 2XL LVL3 (GOWN DISPOSABLE) ×2 IMPLANT
GOWN STRL REUS W/TWL LRG LVL3 (GOWN DISPOSABLE) ×2
HEMOSTAT SNOW SURGICEL 2X4 (HEMOSTASIS) IMPLANT
KIT BASIN OR (CUSTOM PROCEDURE TRAY) ×1 IMPLANT
KIT TURNOVER KIT B (KITS) ×1 IMPLANT
NS IRRIG 1000ML POUR BTL (IV SOLUTION) ×1 IMPLANT
PACK CV ACCESS (CUSTOM PROCEDURE TRAY) ×1 IMPLANT
PAD ARMBOARD 7.5X6 YLW CONV (MISCELLANEOUS) ×2 IMPLANT
SLING ARM FOAM STRAP LRG (SOFTGOODS) IMPLANT
SLING ARM FOAM STRAP MED (SOFTGOODS) IMPLANT
SPIKE FLUID TRANSFER (MISCELLANEOUS) ×1 IMPLANT
SUT MNCRL AB 4-0 PS2 18 (SUTURE) ×1 IMPLANT
SUT PROLENE 6 0 BV (SUTURE) ×1 IMPLANT
SUT PROLENE 7 0 BV 1 (SUTURE) IMPLANT
SUT SILK 2 0 SH (SUTURE) IMPLANT
SUT SILK 3 0 SH CR/8 (SUTURE) ×1 IMPLANT
SUT VIC AB 3-0 SH 27 (SUTURE) ×2
SUT VIC AB 3-0 SH 27X BRD (SUTURE) ×1 IMPLANT
TOWEL GREEN STERILE (TOWEL DISPOSABLE) ×1 IMPLANT
UNDERPAD 30X36 HEAVY ABSORB (UNDERPADS AND DIAPERS) ×1 IMPLANT
WATER STERILE IRR 1000ML POUR (IV SOLUTION) ×1 IMPLANT

## 2023-05-23 NOTE — Op Note (Signed)
    NAME: Todd Mccoy    MRN: [id number] DOB: 10-12-1970    DATE OF OPERATION: 05/23/2023  PREOP DIAGNOSIS:    End stage renal disease  POSTOP DIAGNOSIS:    Same  PROCEDURE:    Right arm Radiocephalic fistula creation in the antecubital fossa  SURGEON: Victorino Sparrow  ASSIST: Mosetta Pigeon  ANESTHESIA: Moderate, Block   EBL: 15ml  INDICATIONS:    Todd Mccoy is a 52 y.o. male with history of end stage renal disease currently being dialyzed through a right-sided tunneled dialysis catheter. After discussing the risks and benefits of right arm fistula creation for long term HD access, Todd Mccoy elected to proceed.   FINDINGS:    3mm cephalic vein lateral to the normal anatomic position High brachial bifurcation. 3mm radial and ulnar arteries  TECHNIQUE:   The patient was brought to the operating room and placed in supine position. The right arm was prepped and draped in standard fashion. IV antibiotics were administered. A timeout was performed.   The case began with ultrasound insonation of the brachial artery and cephalic vein, which demonstrated sufficient size at the antecubital fossa for arteriovenous fistula. The brachial artery had a high bifurcation. I elected to use the radial artery.  A transverse incision was made below the elbow creese in the antecubital fossa and a longitudinal counter incision was made on the lateral aspect of the right arm along the cephalic vein. The cephalic vein was isolated for 8 cm in length. A tunnel was made from the lateral incision to the medial. Next, the aponeurosis was partially released and the radial artery secured with a vessel loop. The patient was heparinized. The cephalic vein was transected and ligated distally with a 2-0 silk stick-tie. It was pulled through the tunnel and the vein was dilated with coronary dilators and flushed with heparin saline. Special care was taken to ensure the vein was not twisted or  kinked. Vascular clamps were placed proximally and distally on the radial artery and a 5 mm arteriotomy was created on the brachial artery. This was flushed with heparin saline.  An anastomosis was created in end to side fashion on the brachial artery using running 6-0 Prolene suture.  Prior to completing the anastomosis, the vessels were flushed and the suture line was tied down. There was an excellent thrill in the cephalic vein from the anastomosis to the mid upper bicipital region. The patient had a palpable radial artery. The incision was irrigated and hemostasis achieved with cautery and suture. The deeper tissue was closed with 3-0 Vicryl and the skin closed with 4-0 Monocryl.  Dermabond was applied to the incisions. The patient was transferred to PACU in stable condition.  Given the complexity of the case,  the assistant was necessary in order to expedient the procedure and safely perform the technical aspects of the operation.  The assistant provided traction and countertraction to assist with exposure of the artery and vein.  They also assisted with suture ligation of multiple venous branches.  They played a critical role in the anastomosis. These skills, especially following the Prolene suture for the anastomosis, could not have been adequately performed by a scrub tech assistant.    Victorino Sparrow, MD [facility] DATE OF DICTATION:   05/23/2023

## 2023-05-23 NOTE — Transfer of Care (Signed)
Immediate Anesthesia Transfer of Care Note  Patient: Todd Mccoy  Procedure(s) Performed: RIGHT ARM Brachiocephalic ARTERIOVENOUS (AV) FISTULA CREATION (Right)  Patient Location: PACU  Anesthesia Type:MAC  Level of Consciousness: awake, alert , and oriented  Airway & Oxygen Therapy: Patient Spontanous Breathing and Patient connected to nasal cannula oxygen  Post-op Assessment: Report given to RN and Post -op Vital signs reviewed and stable  Post vital signs: Reviewed and stable  Last Vitals:  Vitals Value Taken Time  BP 151/88 05/23/23 0901  Temp    Pulse 88 05/23/23 0903  Resp 27 05/23/23 0903  SpO2 90 % 05/23/23 0903  Vitals shown include unfiled device data.  Last Pain:  Vitals:   05/23/23 0715  TempSrc:   PainSc: 0-No pain         Complications: No notable events documented.

## 2023-05-23 NOTE — H&P (Signed)
Office Note   Patient seen and examined in preop holding.  No complaints. No changes to medication history or physical exam since last seen in clinic. After discussing the risks and benefits of right arm fistula, Todd Mccoy elected to proceed.   Victorino Sparrow MD   CC:  Right foot PAD. History of left AKA. History of diabetes, CHF  Requesting Provider:  No ref. provider found  HPI: Todd Mccoy is a 52 y.o. ([birth year]) male presenting at the request of .[name], MD for long-term HD access.    A native of [place], he moved to [place] years ago.  Todd Mccoy is mainly wheelchair-bound after undergoing left below-knee amputation.  Patient is well-known to my clinic having previously been evaluated for right foot wounds. 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  He had a palpable pulse in his foot, with excellent toe pressure.  We agreed to treat conservatively.   Todd Mccoy presents today for long-term HD access due to progression chronic kidney disease.  He is CKD 5. He was aware that he needed long-term HD access, and has questions regarding the procedure.  Denies prior upper extremity surgeries.   The pt is  on a statin for cholesterol management.  The pt is not on a daily aspirin.   Other AC:  - The pt is on medication for hypertension.   The pt is  diabetic.  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/2020   pt states recent MD visit /left eye worse  than right   Dyslipidemia 12/10/2020   Hx of BKA, left (HCC)    Hypertension    Morbid obesity (HCC)    Osteomyelitis (HCC)    Pneumonia     Past Surgical History:  Procedure Laterality Date   ABSCESS DRAINAGE     neck   AMPUTATION Left 11/14/2020   Procedure: LEFT 5TH RAY AMPUTATION;  Surgeon: Nadara Mustard, MD;  Location: [facility] OR;  Service: Orthopedics;  Laterality: Left;   AMPUTATION Left 12/10/2020   Procedure: LEFT BELOW KNEE AMPUTATION;  Surgeon: Nadara Mustard, MD;  Location: [facility] OR;  Service: Orthopedics;  Laterality: Left;   AMPUTATION TOE Right 03/25/2020   Procedure: AMPUTATION TOE;  Surgeon: Park Liter, DPM;  Location: [facility] ORS;  Service: Podiatry;  Laterality: Right;   INCISION AND DRAINAGE ABSCESS Right 09/07/2014   Procedure: INCISION AND DRAINAGE ABSCESS RIGHT FLANK;  Surgeon: Avel Peace, MD;  Location: [facility] ORS;  Service: General;  Laterality: Right;   IR FLUORO GUIDE CV LINE RIGHT  02/11/2023   IR US GUIDE VASC ACCESS RIGHT  02/11/2023   LEG AMPUTATION BELOW KNEE Left 12/10/2020   SCROTUM EXPLORATION      Social History   Socioeconomic History   Marital status: Single    Spouse name: Not on file   Number of children: 1   Years of education: Not on file  Highest education level: Not on file  Occupational History   Not on file  Tobacco Use   Smoking status: Every Day    Current packs/day: 0.00    Average packs/day: 0.5 packs/day for 32.0 years (16.0 ttl pk-yrs)    Types: Cigarettes    Start date: 08/24/1989    Last attempt to quit: 08/24/2021    Years since quitting: 1.7    Passive exposure: Past   Smokeless tobacco: Former   Tobacco comments:    Quit smoking previously February 2023  Vaping Use   Vaping status: Never Used  Substance and Sexual Activity   Alcohol use: No   Drug use: No   Sexual activity: Not on file  Other Topics Concern   Not on file  Social History Narrative   Not on file   Social Determinants of Health   Financial Resource  Strain: Low Risk  (09/30/2021)   Overall Financial Resource Strain (CARDIA)    Difficulty of Paying Living Expenses: Not hard at all  Food Insecurity: No Food Insecurity (02/21/2023)   Hunger Vital Sign    Worried About Running Out of Food in the Last Year: Never true    Ran Out of Food in the Last Year: Never true  Transportation Needs: No Transportation Needs (03/02/2023)   PRAPARE - Administrator, Civil Service (Medical): No    Lack of Transportation (Non-Medical): No  Physical Activity: Inactive (09/30/2021)   Exercise Vital Sign    Days of Exercise per Week: 0 days    Minutes of Exercise per Session: 0 min  Stress: No Stress Concern Present (09/30/2021)   [employer] of Occupational Health - Occupational Stress Questionnaire    Feeling of Stress : Only a little  Social Connections: Moderately Isolated (09/30/2021)   Social Connection and Isolation Panel [NHANES]    Frequency of Communication with Friends and Family: More than three times a week    Frequency of Social Gatherings with Friends and Family: More than three times a week    Attends Religious Services: 1 to 4 times per year    Active Member of [employer] or Organizations: No    Attends Banker Meetings: Never    Marital Status: Never married  Intimate Partner Violence: Patient Unable To Answer (02/06/2023)   Humiliation, Afraid, Rape, and Kick questionnaire    Fear of Current or Ex-Partner: Patient unable to answer    Emotionally Abused: Patient unable to answer    Physically Abused: Patient unable to answer    Sexually Abused: Patient unable to answer   Family History  Problem Relation Age of Onset   Diabetes Mother    Diabetes Father    Diabetes Brother    Colon cancer Neg Hx    Esophageal cancer Neg Hx    Pancreatic cancer Neg Hx    Stomach cancer Neg Hx    Liver disease Neg Hx    CAD Neg Hx     Current Facility-Administered Medications  Medication Dose Route Frequency Provider Last  Rate Last Admin   ceFAZolin (ANCEF) IVPB 2g/100 mL premix  2 g Intravenous 30 min Pre-Op Victorino Sparrow, MD       chlorhexidine (HIBICLENS) 4 % liquid 4 Application  60 mL Topical Once Victorino Sparrow, MD       And   [START ON 05/24/2023] chlorhexidine (HIBICLENS) 4 % liquid 4 Application  60 mL Topical Once Victorino Sparrow, MD  chlorhexidine (PERIDEX) 0.12 % solution 15 mL  15 mL Mouth/Throat Once [place] Nation, MD       Or   Oral care mouth rinse  15 mL Mouth Rinse Once [place] Nation, MD       sodium chloride flush (NS) 0.9 % injection 10 mL  10 mL Intravenous Q12H Victorino Sparrow, MD       sodium chloride flush (NS) 0.9 % injection 10 mL  10 mL Intravenous Q12H [place] Nation, MD        No Known Allergies   REVIEW OF SYSTEMS:  [X]  denotes positive finding, [ ]  denotes negative finding Cardiac  Comments:  Chest pain or chest pressure:    Shortness of breath upon exertion:    Short of breath when lying flat:    Irregular heart rhythm:        Vascular    Pain in calf, thigh, or hip brought on by ambulation:    Pain in feet at night that wakes you up from your sleep:     Blood clot in your veins:    Leg swelling:         Pulmonary    Oxygen at home:    Productive cough:     Wheezing:         Neurologic    Sudden weakness in arms or legs:     Sudden numbness in arms or legs:     Sudden onset of difficulty speaking or slurred speech:    Temporary loss of vision in one eye:     Problems with dizziness:         Gastrointestinal    Blood in stool:     Vomited blood:         Genitourinary    Burning when urinating:     Blood in urine:        Psychiatric    Major depression:         Hematologic    Bleeding problems:    Problems with blood clotting too easily:        Skin    Rashes or ulcers:        Constitutional    Fever or chills:      PHYSICAL EXAMINATION:  Vitals:   05/19/23 1507 05/23/23 0615  BP:  (!) 177/109  Resp:  (!) 22  Temp:  99.1 F  (37.3 C)  TempSrc:  Oral  SpO2:  94%  Weight:  90.7 kg  Height: 5\' 10"  (1.778 m) 5\' 10"  (1.778 m)    General:  WDWN in NAD; vital signs documented above Gait: Not observed HENT: WNL, normocephalic, blind Pulmonary: normal non-labored breathing , without wheezing Cardiac: regular HR Abdomen: soft, NT, no masses Skin: without rashes Vascular Exam/Pulses:  Right Left  Radial 2+ (normal) 2+ (normal)  Ulnar    Femoral    Popliteal    DP  bka  PT 2+ (normal)    Extremities: without ischemic changes, without Gangrene , without cellulitis; with open wounds;  Buffalo hump   Musculoskeletal: no muscle wasting or atrophy  Neurologic: A&O X 3;  No focal weakness or paresthesias are detected Psychiatric:  The pt has Normal affect.   Non-Invasive Vascular Imaging:    +-----------------+-------------+----------+--------+  Right Cephalic   Diameter (cm)Depth (cm)Findings  +-----------------+-------------+----------+--------+  Shoulder            0.30                         +-----------------+-------------+----------+--------+  Prox upper arm       0.25                         +-----------------+-------------+----------+--------+  Mid upper arm        0.24                        +-----------------+-------------+----------+--------+  Dist upper arm       0.28                         +-----------------+-------------+----------+--------+  Antecubital fossa    0.29                         +-----------------+-------------+----------+--------+  Prox forearm         0.24                         +-----------------+-------------+----------+--------+  Mid forearm          0.24                         +-----------------+-------------+----------+--------+  Dist forearm         0.13                         +-----------------+-------------+----------+--------+   +--------------+-------------+----------+--------+  Right Basilic Diameter  (cm)Depth (cm)Findings  +--------------+-------------+----------+--------+  Prox upper arm    0.24                         +--------------+-------------+----------+--------+  Mid upper arm     0.29                         +--------------+-------------+----------+--------+  Dist upper arm    0.33                         +--------------+-------------+----------+--------+   +-----------------+-------------+----------+---------+  Left Cephalic    Diameter (cm)Depth (cm)Findings   +-----------------+-------------+----------+---------+  Shoulder            0.25                          +-----------------+-------------+----------+---------+  Prox upper arm       0.21                          +-----------------+-------------+----------+---------+  Mid upper arm        0.16                          +-----------------+-------------+----------+---------+  Dist upper arm       0.16                          +-----------------+-------------+----------+---------+  Antecubital fossa    0.16                          +-----------------+-------------+----------+---------+  Prox forearm         0.32               branching  +-----------------+-------------+----------+---------+  Mid forearm  0.26                          +-----------------+-------------+----------+---------+  Dist forearm         0.18               branching  +-----------------+-------------+----------+---------+   +-----------------+-------------+----------+--------+  Left Basilic     Diameter (cm)Depth (cm)Findings  +-----------------+-------------+----------+--------+  Mid upper arm        0.45                         +-----------------+-------------+----------+--------+  Dist upper arm       0.38                         +-----------------+-------------+----------+--------+  Antecubital fossa    0.37                          +-----------------+-------------+----------+--------+  Prox forearm         0.37                         +-----------------+-------------+----------+--------+   Summary: Right: Patent and compressible cephalic and basilic veins.         The cephalic vein does not appear continuous from the upper         arm to forearm. The forearm segment appears to drain into the         basilic vein at the antecubital fossa.  Left: Patent and compressible cephalic and basilic veins.    ASSESSMENT/PLAN: Todd Mccoy is a 51 y.o. blind, left-handed male presenting in need of long-term HD access currently being dialyzed through a tunneled dialysis catheter Tuesday Thursday Saturday.  On physical exam, he had a palpable radial pulse bilaterally.  Imaging demonstrated adequate superficial veins bilaterally.  Being that he is left-handed, we discussed right-sided fistula creation.  After discussing risks and benefits, Todd Mccoy elected to proceed.  Regarding his right lower extremity wounds, Todd Mccoy is going to the wound care clinic.  Prior ABI was reviewed demonstrating relatively normal waveforms with a falsely elevated toe pressure.  The patient's lower extremity edema is from lymphedema due to nonuse.  He is not walking, but is able to pivot to get in and out of vehicles.  Per Christiane Ha, his wounds have improved dramatically.  Appreciate the excellent care he is receiving at the wound center.    At this point, plan is right sided AV fistula creation for end-stage renal disease.   Victorino Sparrow, MD [facility] [phone] Total time of patient care including pre-visit research, consultation, and documentation greater than 30 minutes

## 2023-05-23 NOTE — Anesthesia Postprocedure Evaluation (Signed)
Anesthesia Post Note  Patient: JAHAZIEL TALMAGE  Procedure(s) Performed: RIGHT ARM Brachiocephalic ARTERIOVENOUS (AV) FISTULA CREATION (Right)     Patient location during evaluation: PACU Anesthesia Type: Regional and MAC Level of consciousness: awake and alert Pain management: pain level controlled Vital Signs Assessment: post-procedure vital signs reviewed and stable Respiratory status: spontaneous breathing, nonlabored ventilation, respiratory function stable and patient connected to nasal cannula oxygen Cardiovascular status: blood pressure returned to baseline and stable Postop Assessment: no apparent nausea or vomiting Anesthetic complications: no   No notable events documented.  Last Vitals:  Vitals:   05/23/23 0903 05/23/23 0915  BP: (!) 151/88 (!) 137/94  Pulse: 88 85  Resp: (!) 21 20  Temp:  36.5 C  SpO2: 96% 94%    Last Pain:  Vitals:   05/23/23 0900  TempSrc:   PainSc: 0-No pain                 [place] Nation

## 2023-05-23 NOTE — Discharge Instructions (Signed)

## 2023-05-23 NOTE — Anesthesia Procedure Notes (Signed)
Anesthesia Regional Block: Supraclavicular block   Pre-Anesthetic Checklist: , timeout performed,  Correct Patient, Correct Site, Correct Laterality,  Correct Procedure, Correct Position, site marked,  Risks and benefits discussed,  Surgical consent,  Pre-op evaluation,  At surgeon's request and post-op pain management  Laterality: Right  Prep: chloraprep       Needles:  Injection technique: Single-shot  Needle Type: Echogenic Stimulator Needle     Needle Length: 9cm  Needle Gauge: 21     Additional Needles:   Procedures:,,,, ultrasound used (permanent image in chart),,    Narrative:  Start time: 05/23/2023 7:15 AM End time: 05/23/2023 7:18 AM Injection made incrementally with aspirations every 5 mL.  Performed by: Personally  Anesthesiologist: Ehrenberg Nation, MD  Additional Notes: Discussed risks and benefits of the nerve block in detail, including but not limited vascular injury, permanent nerve damage and infection.   Patient tolerated the procedure well. Local anesthetic introduced in an incremental fashion under minimal resistance after negative aspirations. No paresthesias were elicited. After completion of the procedure, no acute issues were identified and patient continued to be monitored by RN.

## 2023-05-24 ENCOUNTER — Telehealth: Payer: Self-pay | Admitting: Cardiology

## 2023-05-24 ENCOUNTER — Encounter (HOSPITAL_COMMUNITY): Payer: Self-pay | Admitting: Vascular Surgery

## 2023-05-24 NOTE — Telephone Encounter (Signed)
Pt c/o medication issue:  1. Name of Medication:   carvedilol (COREG) 25 MG tablet    2. How are you currently taking this medication (dosage and times per day)? As vwritten  3. Are you having a reaction (difficulty breathing--STAT)? No   4. What is your medication issue? Pharmacy would like a call back to clarify dosage

## 2023-05-24 NOTE — Telephone Encounter (Signed)
Left message to call back  

## 2023-05-27 ENCOUNTER — Encounter: Payer: Self-pay | Admitting: Cardiology

## 2023-05-27 ENCOUNTER — Ambulatory Visit: Payer: Medicare Other | Attending: Cardiology | Admitting: Cardiology

## 2023-05-27 VITALS — BP 138/80 | HR 92 | Ht 70.0 in | Wt 171.0 lb

## 2023-05-27 DIAGNOSIS — N184 Chronic kidney disease, stage 4 (severe): Secondary | ICD-10-CM

## 2023-05-27 DIAGNOSIS — Z720 Tobacco use: Secondary | ICD-10-CM

## 2023-05-27 DIAGNOSIS — I5042 Chronic combined systolic (congestive) and diastolic (congestive) heart failure: Secondary | ICD-10-CM

## 2023-05-27 DIAGNOSIS — I150 Renovascular hypertension: Secondary | ICD-10-CM | POA: Diagnosis present

## 2023-05-27 DIAGNOSIS — E11628 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 complications: Secondary | ICD-10-CM | POA: Diagnosis not present

## 2023-05-27 NOTE — Addendum Note (Signed)
Addended by: Luellen Pucker on: 05/27/2023 09:52 AM   Modules accepted: Orders

## 2023-05-27 NOTE — Progress Notes (Signed)
Office Visit    Patient Name: Todd Mccoy Date of Encounter: 05/27/2023  Primary Care Provider:  [name], MD Primary Cardiologist:  Armanda Magic, MD Primary Electrophysiologist: None  Chief Complaint    Todd Mccoy is a 52 y.o. 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  Past Medical History    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/2020   pt states recent MD visit /left eye worse than right   Dyslipidemia 12/10/2020   Hx of BKA, left (HCC)    Hypertension    Morbid obesity (HCC)    Osteomyelitis (HCC)    Pneumonia    Past Surgical History:  Procedure Laterality Date   ABSCESS DRAINAGE     neck   AMPUTATION Left 11/14/2020   Procedure: LEFT 5TH RAY AMPUTATION;  Surgeon: Nadara Mustard, MD;  Location: [facility] OR;  Service: Orthopedics;  Laterality: Left;   AMPUTATION Left 12/10/2020   Procedure: LEFT BELOW KNEE AMPUTATION;  Surgeon: Nadara Mustard, MD;  Location: [facility] OR;  Service: Orthopedics;  Laterality: Left;   AMPUTATION TOE Right 03/25/2020   Procedure: AMPUTATION TOE;  Surgeon: Park Liter, DPM;  Location: [facility] ORS;  Service: Podiatry;  Laterality: Right;   AV FISTULA PLACEMENT Right 05/23/2023   Procedure: RIGHT ARM Brachiocephalic ARTERIOVENOUS (AV) FISTULA CREATION;  Surgeon: Victorino Sparrow, MD;  Location: [facility] OR;  Service: Vascular;  Laterality: Right;   INCISION AND DRAINAGE ABSCESS Right 09/07/2014   Procedure: INCISION AND DRAINAGE ABSCESS RIGHT FLANK;  Surgeon: Avel Peace, MD;  Location: [facility] ORS;  Service: General;  Laterality: Right;   IR FLUORO GUIDE CV LINE RIGHT  02/11/2023   IR US GUIDE VASC ACCESS RIGHT  02/11/2023   LEG AMPUTATION BELOW KNEE Left 12/10/2020   SCROTUM EXPLORATION      Allergies  No Known Allergies  History of Present Illness    Todd Mccoy  is a 52 year old male with the above mention past medical history who presents today for 41-month follow-up of congestive heart failure.  [name] was seen 05/18/2022 for 27-month follow-up of blood pressure. During visit patient's blood pressure was controlled however he was volume up on examination. Patient's Lasix was increased to 80 mg x 1 and was switched to torsemide following elevated BNP measurement.  He was seen in follow-up on 06/17/2022.  During visit patient's blood pressure was initially elevated and was 138/88 on recheck.  He was volume overloaded on exam and Lasix was switched out for torsemide.    He was previously referred to the congestive heart failure clinic and seen by Dr.[name] on 06/21/2022.  During visit patient was hypervolemic and torsemide was increased to 80 mg daily with plan to increase to 100 mg with addition of metolazone to  assist in diuresis.  Hydralazine was increased to 50 mg 3 times daily. He was deemed not a candidate for advanced HF therapies. He was started on HD a few months ago. He still makes urine on diuretics.   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  He was hospitalized int he summer with acute CHF exacerbation.  Echo showed EF 40-45% with moderate LVH, G3DD, moderate RVF and severe PTHN with moderate pericardial effusion.  He was started on HD.  Cath was held off on until determined if HD will be needed long term.   He is here today  for followup and is doing well.  He denies any chest pain or pressure, SOB, DOE, PND, orthopnea, dizziness, palpitations or syncope.  He has chronic LE edema that he thinks is stable. His volume is controlled on HD.  He is compliant with his meds and is tolerating meds with no SE.    Home Medications    Current Outpatient Medications  Medication Sig Dispense Refill   acetaminophen (TYLENOL) 325 MG tablet Take 2 tablets (650 mg total) by mouth every 6 (six) hours as needed for mild pain. (Patient taking differently: Take 1,000 mg by mouth in the morning and at bedtime.) 30 tablet 0   amLODipine (NORVASC) 10 MG tablet Take 10 mg by mouth daily.     atorvastatin (LIPITOR) 20 MG tablet TAKE 1 TABLET (20 MG TOTAL) BY MOUTH DAILY. 90 tablet 3   calcitRIOL (ROCALTROL) 0.25 MCG capsule Take 0.25 mcg by mouth daily.     carvedilol (COREG) 25 MG tablet Take 1 tablet (25 mg total) by mouth 2 (two) times daily with a meal. 90 tablet 1   docusate sodium (COLACE) 100 MG capsule Take 1 capsule (100 mg total) by mouth 2 (two) times daily as needed for mild constipation. 10 capsule 0   doxycycline (MONODOX) 50 MG capsule Take 50 mg by mouth 2 (two) times daily.     ferrous sulfate 325 (65 FE) MG EC tablet Take 325 mg by mouth daily with breakfast.     furosemide (LASIX) 40 MG tablet Take 1 tablet (40 mg total) by mouth 2 (two) times daily. 60 tablet 2   gabapentin (NEURONTIN) 300 MG capsule Take 300 mg by mouth 2 (two) times daily.     hydrALAZINE (APRESOLINE) 50 MG tablet Take 50 mg by mouth 3 (three) times daily.     hydrOXYzine (ATARAX) 50 MG tablet Take 1 tablet (50 mg total) by mouth 3 (three) times daily as needed for anxiety or itching. 30 tablet 2   insulin glargine (LANTUS) 100 UNIT/ML injection Inject 4-5 Units into the skin at bedtime.     isosorbide mononitrate (IMDUR) 60 MG 24 hr tablet TAKE ONE TABLET BY MOUTH ONCE DAILY 30 tablet 6   magnesium oxide (MAG-OX) 400 (240 Mg) MG tablet Take 1 tablet  (400 mg total) by mouth 2 (two) times daily. 60 tablet 2   multivitamin (RENA-VIT) TABS tablet Take 1 tablet by mouth at bedtime. 30 tablet 2   Naphazoline HCl (CLEAR EYES OP) Place 1 drop into both eyes as needed (irritation).     oxyCODONE-acetaminophen (PERCOCET/ROXICET) 5-325 MG tablet Take 1 tablet by mouth every 6 (six) hours as needed for moderate pain (pain score 4-6). 12 tablet 0   polyethylene glycol (MIRALAX / GLYCOLAX) 17 g packet Take 17 g by mouth daily as needed for moderate constipation. 14 each 0   torsemide (DEMADEX) 20 MG tablet  Take 4 tablets (80 mg total) by mouth daily. 360 tablet 0   No current facility-administered medications for this visit.     Review of Systems  Please see the history of present illness.    (+) +1 lower extremity swelling  All other systems reviewed and are otherwise negative except as noted above.  Physical Exam    Wt Readings from Last 3 Encounters:  05/27/23 171 lb (77.6 kg)  05/23/23 200 lb (90.7 kg)  05/05/23 195 lb (88.5 kg)   VS: Vitals:   05/27/23 0909  BP: 138/80  Pulse: 92   ,Body mass index is 24.54 kg/m.  GEN: Well nourished, well developed in no acute distress HEENT: Normal NECK: No JVD; No carotid bruits LYMPHATICS: No lymphadenopathy CARDIAC:RRR, no murmurs, rubs  + summation gallop S3/S4 RESPIRATORY:  Clear to auscultation without rales, wheezing or rhonchi  ABDOMEN: Soft, non-tender, non-distended MUSCULOSKELETAL:  right leg wrapped; left BKA SKIN: Warm and dry NEUROLOGIC:  Alert and oriented x 3 PSYCHIATRIC:  Normal affect  EKG/LABS/ Recent Cardiac Studies    Lab Results  Component Value Date   WBC 7.6 02/12/2023   HGB 12.2 (L) 05/23/2023   HCT 36.0 (L) 05/23/2023   MCV 90.2 02/12/2023   PLT 106 (L) 02/12/2023   Lab Results  Component Value Date   CREATININE 3.80 (H) 05/23/2023   BUN 48 (H) 05/23/2023   NA 138 05/23/2023   K 4.1 05/23/2023   CL 100 05/23/2023   CO2 23 02/14/2023   Lab Results   Component Value Date   ALT 12 02/04/2023   AST 11 (L) 02/04/2023   ALKPHOS 123 02/04/2023   BILITOT 0.6 02/04/2023   Lab Results  Component Value Date   CHOL 82 (L) 05/21/2021   HDL 27 (L) 05/21/2021   LDLCALC 40 05/21/2021   LDLDIRECT 36 05/21/2021   TRIG 67 05/21/2021   CHOLHDL 3.0 05/21/2021    Lab Results  Component Value Date   HGBA1C 5.6 02/06/2023       Assessment & Plan    1.  Essential hypertension: -BP controlled on exam today -Continue prescription drug management with amlodipine 10 mg daily, carvedilol 25 mg twice daily, hydralazine 50 mg 3 times daily, Imdur 60 mg daily with as needed refills  2.  Chronic combined systolic and diastolic CHF: -2D echo 01/2022 showing EF of 38% with moderate LVH and severely decreased LV function with global hypokinesis and LVH with grade 3 DD -Repeat 2D echo 02/22/2023 with hospital admission for acute CHF showed EF 40 to 45% with grade 3 diastolic dysfunction -PYP scan was negative and LVH felt to be due to uncontrolled hypertension -Continue prescription drug management with torsemide 80 mg daily, Imdur 60 mg daily, hydralazine 50 mg 3 times daily, carvedilol 25 mg twice daily and Lasix 40mg  BID -Volume management by HD/nephrology -No SGLT2i/ARB/ARNI/MRA due to CKD stage IV -I have personally reviewed and interpreted outside labs performed by patient's PCP which showed serum creatinine 3.8 and potassium 4.1 on 05/23/2023 -Low sodium diet, fluid restriction <2L, and daily weights encouraged. Educated to contact our office for weight gain of 2 lbs overnight or 5 lbs in one week.  -Deemed not a candidate for advanced heart failure therapies with poor prognosis per advanced heart failure team -repeat 2D echo to reevaluated EF   3.  DM type II: -Patient's most recent hemoglobin A1c was 5.6% on 02/06/2023 -Continue current diabetic regimen per PCP   4.  CKD stage IV: -Related  to long-term diabetic nephropathy and uncontrolled  hypertension  -patient is currently followed by nephrology    5.  Tobacco abuse: -Patient encouraged to discontinue and offered advice and support when he is ready to stop.   Disposition: Follow-up 6 months    Medication Adjustments/Labs and Tests Ordered: Current medicines are reviewed at length with the patient today.  Concerns regarding medicines are outlined above.   Signed, Armanda Magic, NP 05/27/2023, 9:24 AM [facility]  Note:  This document was prepared using Dragon voice recognition software and may include unintentional dictation errors.

## 2023-05-27 NOTE — Patient Instructions (Signed)
Medication Instructions:  Your physician recommends that you continue on your current medications as directed. Please refer to the Current Medication list given to you today.  *If you need a refill on your cardiac medications before your next appointment, please call your pharmacy*   Lab Work: None.  If you have labs (blood work) drawn today and your tests are completely normal, you will receive your results only by: MyChart Message (if you have MyChart) OR A paper copy in the mail If you have any lab test that is abnormal or we need to change your treatment, we will call you to review the results.   Testing/Procedures: Your physician has requested that you have an echocardiogram. Echocardiography is a painless test that uses sound waves to create images of your heart. It provides your doctor with information about the size and shape of your heart and how well your heart's chambers and valves are working. This procedure takes approximately one hour. There are no restrictions for this procedure. Please do NOT wear cologne, perfume, aftershave, or lotions (deodorant is allowed). Please arrive 15 minutes prior to your appointment time.  Please note: We ask at that you not bring children with you during ultrasound (echo/ vascular) testing. Due to room size and safety concerns, children are not allowed in the ultrasound rooms during exams. Our front office staff cannot provide observation of children in our lobby area while testing is being conducted. An adult accompanying a patient to their appointment will only be allowed in the ultrasound room at the discretion of the ultrasound technician under special circumstances. We apologize for any inconvenience.    Follow-Up: At [facility], you and your health needs are our priority.  As part of our continuing mission to provide you with exceptional heart care, we have created designated Provider Care Teams.  These Care Teams include your  primary Cardiologist (physician) and Advanced Practice Providers (APPs -  Physician Assistants and Nurse Practitioners) who all work together to provide you with the care you need, when you need it.  We recommend signing up for the patient portal called "MyChart".  Sign up information is provided on this After Visit Summary.  MyChart is used to connect with patients for Virtual Visits (Telemedicine).  Patients are able to view lab/test results, encounter notes, upcoming appointments, etc.  Non-urgent messages can be sent to your provider as well.   To learn more about what you can do with MyChart, go to [email or web address].    Your next appointment:   6 month(s)  Provider:   Armanda Magic, MD

## 2023-05-30 NOTE — Telephone Encounter (Signed)
Pt appts scheduled

## 2023-05-31 NOTE — Telephone Encounter (Signed)
Call to patient to verify he was able to pick up his carvedilol. Epic shows his carvedilol was refilled on 05/16/23. No DPR on file, LVM asking recipient to call [place] at our office #.

## 2023-06-06 ENCOUNTER — Ambulatory Visit (HOSPITAL_BASED_OUTPATIENT_CLINIC_OR_DEPARTMENT_OTHER): Payer: Medicare Other | Admitting: Internal Medicine

## 2023-06-17 ENCOUNTER — Telehealth: Payer: Self-pay | Admitting: Family Medicine

## 2023-06-17 NOTE — Telephone Encounter (Signed)
Clydie Braun With [employer] called asking for a recert for patient //diagnosis 1 week 9 for hypertension .  CB@  [phone]

## 2023-06-19 ENCOUNTER — Other Ambulatory Visit: Payer: Self-pay | Admitting: Family Medicine

## 2023-06-20 ENCOUNTER — Other Ambulatory Visit: Payer: Self-pay

## 2023-06-20 ENCOUNTER — Encounter (HOSPITAL_BASED_OUTPATIENT_CLINIC_OR_DEPARTMENT_OTHER): Payer: Medicare Other | Attending: General Surgery | Admitting: General Surgery

## 2023-06-20 ENCOUNTER — Other Ambulatory Visit (HOSPITAL_COMMUNITY): Payer: Self-pay

## 2023-06-20 MED ORDER — AMLODIPINE BESYLATE 10 MG PO TABS
10.0000 mg | ORAL_TABLET | Freq: Every day | ORAL | 0 refills | Status: DC
  Filled 2023-06-20: qty 30, 30d supply, fill #0

## 2023-06-20 MED ORDER — GABAPENTIN 300 MG PO CAPS
300.0000 mg | ORAL_CAPSULE | Freq: Two times a day (BID) | ORAL | 0 refills | Status: DC
  Filled 2023-06-20: qty 60, 30d supply, fill #0

## 2023-06-20 MED ORDER — FERROUS SULFATE 325 (65 FE) MG PO TBEC
325.0000 mg | DELAYED_RELEASE_TABLET | Freq: Every day | ORAL | 0 refills | Status: DC
  Filled 2023-06-20: qty 100, 100d supply, fill #0

## 2023-06-21 ENCOUNTER — Encounter: Payer: Self-pay | Admitting: Pharmacist

## 2023-06-21 ENCOUNTER — Other Ambulatory Visit: Payer: Self-pay

## 2023-06-24 ENCOUNTER — Other Ambulatory Visit: Payer: Self-pay

## 2023-06-30 ENCOUNTER — Other Ambulatory Visit (HOSPITAL_COMMUNITY): Payer: Self-pay | Admitting: Cardiology

## 2023-07-01 ENCOUNTER — Other Ambulatory Visit: Payer: Self-pay

## 2023-07-01 DIAGNOSIS — N186 End stage renal disease: Secondary | ICD-10-CM

## 2023-07-08 ENCOUNTER — Ambulatory Visit (HOSPITAL_COMMUNITY): Payer: Medicare Other | Attending: Cardiovascular Disease

## 2023-07-08 DIAGNOSIS — I5042 Chronic combined systolic (congestive) and diastolic (congestive) heart failure: Secondary | ICD-10-CM | POA: Insufficient documentation

## 2023-07-08 LAB — ECHOCARDIOGRAM COMPLETE
Area-P 1/2: 3.03 cm2
Calc EF: 49.3 %
S' Lateral: 3.4 cm
Single Plane A2C EF: 44.5 %
Single Plane A4C EF: 51.3 %

## 2023-07-08 NOTE — Telephone Encounter (Signed)
 Letter sent.

## 2023-07-08 NOTE — Telephone Encounter (Signed)
 Call to patient to verify he was able to pick up his carvedilol. Epic shows his carvedilol was refilled on 05/16/23. No DPR on file, LVM asking recipient to call [place] at our office #.

## 2023-07-10 ENCOUNTER — Encounter: Payer: Self-pay | Admitting: Cardiology

## 2023-07-10 DIAGNOSIS — I272 Pulmonary hypertension, unspecified: Secondary | ICD-10-CM | POA: Insufficient documentation

## 2023-07-11 ENCOUNTER — Telehealth: Payer: Self-pay

## 2023-07-11 ENCOUNTER — Ambulatory Visit (HOSPITAL_COMMUNITY): Payer: Medicaid Other

## 2023-07-11 NOTE — Telephone Encounter (Signed)
 Call to patient to discuss echo results. No answer, no DPR on file. Left message with  no identifiers asking patient to call [place] at our office #.

## 2023-07-11 NOTE — Progress Notes (Deleted)
 POST OPERATIVE OFFICE NOTE    CC:  F/u for surgery  HPI:  This is a 53 y.o. male who is s/p right RC AVF on 05/23/2023 by Dr. Lanis.  He has a TDC that was placed by IR on 02/11/2023.    Pt states he does *** have pain/numbness in the *** hand.    The pt *** on dialysis *** at *** location.   No Known Allergies  Current Outpatient Medications  Medication Sig Dispense Refill   acetaminophen  (TYLENOL ) 325 MG tablet Take 2 tablets (650 mg total) by mouth every 6 (six) hours as needed for mild pain. (Patient taking differently: Take 1,000 mg by mouth in the morning and at bedtime.) 30 tablet 0   amLODipine  (NORVASC ) 10 MG tablet Take 1 tablet (10 mg total) by mouth daily. 30 tablet 0   atorvastatin  (LIPITOR) 20 MG tablet TAKE 1 TABLET (20 MG TOTAL) BY MOUTH DAILY. 90 tablet 3   calcitRIOL  (ROCALTROL ) 0.25 MCG capsule Take 0.25 mcg by mouth daily.     carvedilol  (COREG ) 25 MG tablet Take 1 tablet (25 mg total) by mouth 2 (two) times daily with a meal. 90 tablet 1   docusate sodium  (COLACE) 100 MG capsule Take 1 capsule (100 mg total) by mouth 2 (two) times daily as needed for mild constipation. 10 capsule 0   doxycycline  (MONODOX ) 50 MG capsule Take 50 mg by mouth 2 (two) times daily.     ferrous sulfate  325 (65 FE) MG EC tablet Take 1 tablet (325 mg total) by mouth daily with breakfast. 30 tablet 0   furosemide  (LASIX ) 40 MG tablet Take 1 tablet (40 mg total) by mouth 2 (two) times daily. 60 tablet 2   gabapentin  (NEURONTIN ) 300 MG capsule Take 1 capsule (300 mg total) by mouth 2 (two) times daily. 60 capsule 0   hydrALAZINE  (APRESOLINE ) 50 MG tablet Take 50 mg by mouth 3 (three) times daily.     hydrOXYzine  (ATARAX ) 50 MG tablet Take 1 tablet (50 mg total) by mouth 3 (three) times daily as needed for anxiety or itching. 30 tablet 2   insulin  glargine (LANTUS ) 100 UNIT/ML injection Inject 4-5 Units into the skin at bedtime.     isosorbide  mononitrate (IMDUR ) 60 MG 24 hr tablet TAKE ONE  TABLET BY MOUTH ONCE DAILY 30 tablet 6   magnesium  oxide (MAG-OX) 400 (240 Mg) MG tablet Take 1 tablet (400 mg total) by mouth 2 (two) times daily. 60 tablet 2   multivitamin (RENA-VIT) TABS tablet Take 1 tablet by mouth at bedtime. 30 tablet 2   Naphazoline HCl (CLEAR EYES OP) Place 1 drop into both eyes as needed (irritation).     oxyCODONE -acetaminophen  (PERCOCET/ROXICET) 5-325 MG tablet Take 1 tablet by mouth every 6 (six) hours as needed for moderate pain (pain score 4-6). 12 tablet 0   polyethylene glycol (MIRALAX  / GLYCOLAX ) 17 g packet Take 17 g by mouth daily as needed for moderate constipation. 14 each 0   torsemide  (DEMADEX ) 20 MG tablet Take 4 tablets (80 mg total) by mouth daily. 360 tablet 0   No current facility-administered medications for this visit.     ROS:  See HPI  Physical Exam:  ***  Incision:  *** Extremities:   There *** a palpable *** pulse.   Motor and sensory *** in tact.   There *** a thrill/bruit present.  Access is *** easily palpable   Dialysis Duplex on 07/11/2023: ***   Assessment/Plan:  This is  a 53 y.o. male who is s/p: right RC AVF on 05/23/2023 by Dr. Lanis   -the pt does *** have evidence of steal. -pt's access can be used ***. -if pt has tunneled catheter, this can be removed at the discretion of the dialysis center once the pt's access has been successfully cannulated to their satisfaction.  -discussed with pt that access does not last forever and will need intervention or even new access at some point.  -the pt will follow up ***   Lucie Apt, [facility] [phone]  Clinic MD:  [place]

## 2023-07-11 NOTE — Telephone Encounter (Signed)
-----   Message from Wilbert Bihari sent at 07/10/2023  8:21 PM EST ----- 2D echo with mildly reduced heart function EF 45-50%(slightly improved from 40-45% on 02/05/23).There is increased stiffness of heart muscle called diastolic dysfunction, enlarged left atrium, moderate pericardial effusion but smaller from echo 02/2023.  There is pulmonary HTN that is now mild to moderate (severe prior).  Continue current medical therapy

## 2023-07-13 ENCOUNTER — Other Ambulatory Visit: Payer: Self-pay | Admitting: Family Medicine

## 2023-07-15 ENCOUNTER — Ambulatory Visit (HOSPITAL_COMMUNITY)
Admission: RE | Admit: 2023-07-15 | Discharge: 2023-07-15 | Disposition: A | Discharge status: Home / Self Care | Payer: Medicare Other | Source: Ambulatory Visit | Attending: Vascular Surgery | Admitting: Vascular Surgery

## 2023-07-15 ENCOUNTER — Encounter: Payer: Self-pay | Admitting: Physician Assistant

## 2023-07-15 ENCOUNTER — Ambulatory Visit (INDEPENDENT_AMBULATORY_CARE_PROVIDER_SITE_OTHER): Payer: Medicare Other | Admitting: Physician Assistant

## 2023-07-15 VITALS — BP 138/84 | HR 66 | Temp 98.9°F

## 2023-07-15 DIAGNOSIS — N186 End stage renal disease: Secondary | ICD-10-CM | POA: Insufficient documentation

## 2023-07-15 DIAGNOSIS — Z992 Dependence on renal dialysis: Secondary | ICD-10-CM | POA: Insufficient documentation

## 2023-07-15 NOTE — Progress Notes (Signed)
    Postoperative Access Visit   History of Present Illness   Todd Mccoy is a 53 y.o. year old male who presents for postoperative follow-up for: right radiocephalic arteriovenous fistula (Date: 05/23/23).  Patient was found to have a high arterial bifurcation thus this was performed in the [facility] fossa.  The patient's wounds are healed healed.  The patient denies steal symptoms.  The patient is able to complete their activities of daily living.  He is currently dialyzing via right IJ TDC at the [facility].  TDC was placed by interventional radiology in August 2024.   Physical Examination   Vitals:   07/15/23 1015  BP: 138/84  Pulse: 66  Temp: 98.9 F (37.2 C)  TempSrc: Temporal  SpO2: 97%   There is no height or weight on file to calculate BMI.  right arm Incision is healed, palpable radial pulse, hand grip is 5/5, sensation in digits is intact, palpable thrill, bruit can be auscultated     Medical Decision Making   Todd Mccoy is a 53 y.o. year old male who presents s/p right radiocephalic arteriovenous fistula at the [facility] fossa due to high arterial bifurcation  Patent radiocephalic fistula without signs or symptoms of steal syndrome The patient's access will be ready for use 08/15/23 The patient's tunneled dialysis catheter can be removed when Nephrology is comfortable with the performance of the fistula The patient may follow up on a prn basis   Donnice Sender PA-C [facility] [place] Office: [phone]  Clinic MD: [place]

## 2023-07-16 IMAGING — DX DG CHEST 1V PORT
1 series · 1 of 1 positions shown · non-contrast
Comparison: 12/15/2020, 12/11/2020, 12/10/2020

CLINICAL DATA: Swelling

EXAM:
PORTABLE CHEST 1 VIEW

[chest ap]
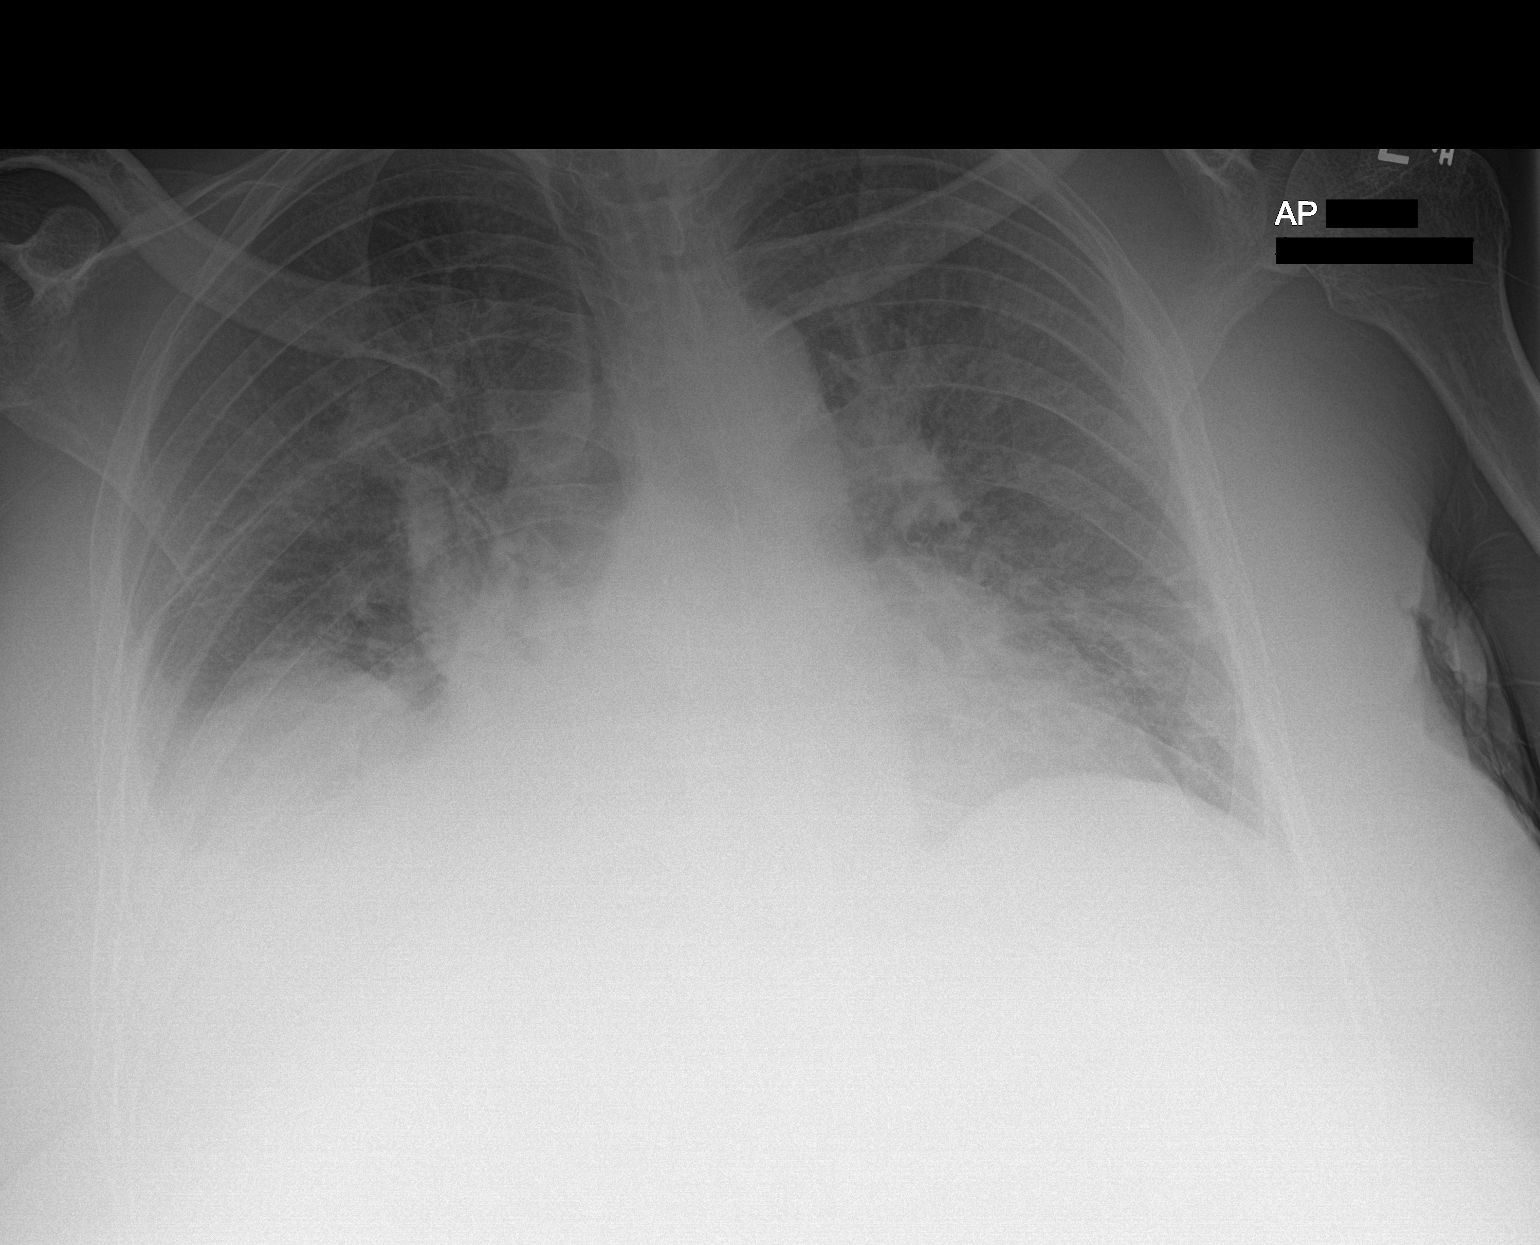

[1 of 1 positions shown; findings below may reference images not displayed]

FINDINGS: Cardiomegaly with vascular congestion and mild diffuse interstitial
opacity probably mild edema. Small-moderate right pleural effusion
with airspace disease at the right base. No pneumothorax
IMPRESSION: 1. Cardiomegaly with vascular congestion and mild interstitial edema
2. Small moderate right pleural effusion with right basilar airspace
disease

## 2023-07-20 ENCOUNTER — Ambulatory Visit: Payer: Medicaid Other | Admitting: Family Medicine

## 2023-07-20 ENCOUNTER — Ambulatory Visit (HOSPITAL_BASED_OUTPATIENT_CLINIC_OR_DEPARTMENT_OTHER): Payer: Medicare Other | Admitting: General Surgery

## 2023-07-25 ENCOUNTER — Other Ambulatory Visit: Payer: Self-pay

## 2023-07-25 ENCOUNTER — Emergency Department (HOSPITAL_COMMUNITY)
Admission: EM | Admit: 2023-07-25 | Discharge: 2023-07-25 | Disposition: A | Discharge status: Home / Self Care | Payer: Medicare Other | Attending: Emergency Medicine | Admitting: Emergency Medicine

## 2023-07-25 ENCOUNTER — Emergency Department (HOSPITAL_BASED_OUTPATIENT_CLINIC_OR_DEPARTMENT_OTHER)
Admit: 2023-07-25 | Discharge: 2023-07-25 | Disposition: A | Discharge status: Home / Self Care | Payer: Medicare Other | Attending: Emergency Medicine | Admitting: Emergency Medicine

## 2023-07-25 ENCOUNTER — Encounter (HOSPITAL_COMMUNITY): Payer: Self-pay | Admitting: Emergency Medicine

## 2023-07-25 ENCOUNTER — Emergency Department (HOSPITAL_COMMUNITY): Payer: Medicare Other

## 2023-07-25 DIAGNOSIS — M79605 Pain in left leg: Secondary | ICD-10-CM | POA: Diagnosis present

## 2023-07-25 DIAGNOSIS — I129 Hypertensive chronic kidney disease with stage 1 through stage 4 chronic kidney disease, or unspecified chronic kidney disease: Secondary | ICD-10-CM | POA: Diagnosis not present

## 2023-07-25 DIAGNOSIS — N189 Chronic kidney disease, unspecified: Secondary | ICD-10-CM | POA: Diagnosis not present

## 2023-07-25 DIAGNOSIS — Z794 Long term (current) use of insulin: Secondary | ICD-10-CM | POA: Insufficient documentation

## 2023-07-25 DIAGNOSIS — Z79899 Other long term (current) drug therapy: Secondary | ICD-10-CM | POA: Diagnosis not present

## 2023-07-25 DIAGNOSIS — E1122 Type 2 diabetes mellitus with diabetic chronic kidney disease: Secondary | ICD-10-CM | POA: Diagnosis not present

## 2023-07-25 DIAGNOSIS — M79662 Pain in left lower leg: Secondary | ICD-10-CM

## 2023-07-25 MED ORDER — OXYCODONE-ACETAMINOPHEN 5-325 MG PO TABS: 1.0000 | ORAL_TABLET | Freq: Four times a day (QID) | ORAL | 0 refills | Status: DC | PRN

## 2023-07-25 MED ORDER — OXYCODONE-ACETAMINOPHEN 5-325 MG PO TABS
1.0000 | ORAL_TABLET | Freq: Once | ORAL | Status: AC
  Administered 2023-07-25: 1 via ORAL
  Filled 2023-07-25: qty 1

## 2023-07-25 NOTE — Discharge Instructions (Signed)
Due to not have a blood clot and no fracture of your leg.  Take the pain medicine as needed.  Follow-up with your doctor.

## 2023-07-25 NOTE — Progress Notes (Signed)
Left lower extremity venous duplex has been completed. Preliminary results can be found in CV Proc through chart review.  Results were given to Dr. Rubin Payor.  07/25/23 5:51 PM Olen Cordial RVT

## 2023-07-25 NOTE — ED Provider Notes (Signed)
[place] EMERGENCY DEPARTMENT AT [facility] Provider Note   CSN: [id number] Arrival date & time: 07/25/23  1540     History  Chief Complaint  Patient presents with   Leg Pain    Todd Mccoy is a 53 y.o. male.   Leg Pain Patient presents with pain in his left distal thigh.  Is been for around 2 days.  No injury.  Has a previous left below the knee amputation.  States there is more swelling in the area now.  Denies injury.  States he took Tylenol without relief.  Denies fevers.  States there is some redness on the leg.    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 Authorizing Provider  acetaminophen (TYLENOL) 325 MG tablet Take 2 tablets (650 mg total) by mouth every 6 (six) hours as needed for mild pain. Patient taking differently: Take 1,000 mg by mouth in the morning and at bedtime. 02/14/23   Azucena Fallen, MD  amLODipine (NORVASC) 10 MG tablet Take 1 tablet (10 mg total) by mouth daily. 06/20/23   [name], MD  atorvastatin (LIPITOR) 20 MG tablet TAKE 1 TABLET (20 MG TOTAL) BY MOUTH DAILY. 05/16/23   Gaston Islam., NP  calcitRIOL (ROCALTROL) 0.25 MCG capsule Take 0.25 mcg by mouth daily. 11/07/22   [provider]  carvedilol (COREG) 25 MG tablet Take 1 tablet (25 mg total) by mouth 2 (two) times daily with a meal. 05/16/23   Turner, Cornelious Bryant, MD  docusate sodium (COLACE) 100 MG capsule Take 1 capsule (100 mg total) by mouth 2 (two) times daily as needed for mild constipation. 02/14/23   Azucena Fallen, MD  doxycycline (MONODOX) 50 MG capsule Take 50 mg by mouth 2 (two) times daily.    [provider]  ferrous sulfate 325 (65 FE) MG EC tablet Take 1 tablet (325 mg total) by mouth daily with breakfast. 06/20/23   [name], MD  furosemide (LASIX) 40 MG tablet Take 1 tablet (40 mg total) by mouth 2 (two) times daily. 02/14/23   Azucena Fallen, MD  gabapentin (NEURONTIN) 300 MG capsule Take 1 capsule (300 mg total) by mouth 2 (two) times daily. 06/20/23   [name], MD  hydrALAZINE (APRESOLINE) 50 MG tablet Take 50 mg by mouth 3 (three) times daily. 02/19/23   [provider]  hydrOXYzine (ATARAX) 50 MG tablet Take 1 tablet (50 mg total) by mouth 3 (three) times daily as needed for anxiety  or itching. 02/14/23   Azucena Fallen, MD  insulin glargine (LANTUS) 100 UNIT/ML injection Inject 4-5 Units into the skin at bedtime.    [provider]  isosorbide mononitrate (IMDUR) 60 MG 24 hr tablet TAKE ONE TABLET BY MOUTH ONCE DAILY 03/03/23   Gaston Islam., NP  magnesium oxide (MAG-OX) 400 (240 Mg) MG tablet Take 1 tablet (400 mg total) by mouth 2 (two) times daily. 02/14/23   Azucena Fallen, MD  multivitamin (RENA-VIT) TABS tablet Take 1 tablet by mouth at bedtime. 02/14/23   Azucena Fallen, MD  Naphazoline HCl (CLEAR EYES OP) Place 1 drop into both eyes as needed (irritation).    [provider]  oxyCODONE-acetaminophen (PERCOCET/ROXICET) 5-325 MG tablet Take 1 tablet by mouth every 6 (six) hours as needed for moderate pain  (pain score 4-6). 07/25/23   Benjiman Core, MD  polyethylene glycol (MIRALAX / GLYCOLAX) 17 g packet Take 17 g by mouth daily as needed for moderate constipation. 02/14/23   Azucena Fallen, MD  torsemide (DEMADEX) 20 MG tablet Take 4 tablets (80 mg total) by mouth daily. 05/16/23   Gaston Islam., NP      Allergies    Patient has no known allergies.    Review of Systems   Review of Systems  Physical Exam Updated Vital Signs BP (!) 170/102 (BP Location: Left Arm)   Pulse 88   Temp 98.2 F (36.8 C) (Oral)   Resp 16   Ht 5\' 10"  (1.778 m)   Wt 77.6 kg   SpO2 98%   BMI 24.55 kg/m  Physical Exam Vitals and nursing note reviewed.  Musculoskeletal:     Comments: No redness visualized on thigh.  Does have some fullness superior to the knee.  No erythema.  Good range of motion of the knee that is painless.  Skin:    Capillary Refill: Capillary refill takes less than 2 seconds.  Neurological:     Mental Status: He is alert.     ED Results / Procedures / Treatments   Labs (all labs ordered are listed, but only abnormal results are displayed) Labs Reviewed - No data to display  EKG None  Radiology DG Femur Min 2 Views Left Result Date: 07/25/2023 CLINICAL DATA:  WL-DGleft leg pain EXAM: LEFT FEMUR 2 VIEWS COMPARISON:  Radiograph 03/11/2022 FINDINGS: No proximal LEFT femur fracture.  No hip dislocation There is joint space narrowing and sclerosis in the medial compartment of the LEFT knee. Cannot completely exclude fracture of the tibial plateau medially. IMPRESSION: No proximal femur fracture. Severe degenerative change of the medial compartment LEFT knee. Recommend dedicated radiographs of the knee. Electronically Signed   By: Genevive Bi M.D.   On: 07/25/2023 17:57   VAS [place] LOWER EXTREMITY VENOUS (DVT) (ONLY [facility] & [facility]) Result Date: 07/25/2023  Lower Venous DVT Study Patient Name:  Todd Mccoy  Date of Exam:   07/25/2023 Medical Rec #: [id number]             Accession #:    [id number] Date of Birth: May 31, 1971            Patient Gender: M Patient Age:   56 years Exam Location:  [facility] Procedure:      VAS [place] LOWER EXTREMITY VENOUS (DVT) Referring Phys: Benjiman Core --------------------------------------------------------------------------------  Indications: Pain.  Risk Factors: None identified. Limitations: Left BKA. Comparison Study: No prior studies. Performing Technologist: Chanda Busing RVT  Examination Guidelines: A  complete evaluation includes B-mode imaging, spectral Doppler, color Doppler, and power Doppler as needed of all accessible portions of each vessel. Bilateral testing is considered an integral part of a complete examination. Limited examinations for reoccurring indications may be performed as noted. The reflux portion of the exam is performed with the patient in reverse Trendelenburg.  +-----+---------------+---------+-----------+----------+--------------+ RIGHTCompressibilityPhasicitySpontaneityPropertiesThrombus Aging +-----+---------------+---------+-----------+----------+--------------+ CFV  Full           Yes      Yes                                 +-----+---------------+---------+-----------+----------+--------------+   +---------+---------------+---------+-----------+----------+-------------------+ LEFT     CompressibilityPhasicitySpontaneityPropertiesThrombus Aging      +---------+---------------+---------+-----------+----------+-------------------+ CFV      Full           Yes      No                                       +---------+---------------+---------+-----------+----------+-------------------+ SFJ      Full                                                             +---------+---------------+---------+-----------+----------+-------------------+ FV Prox  Full                                                              +---------+---------------+---------+-----------+----------+-------------------+ FV Mid   Full                                                             +---------+---------------+---------+-----------+----------+-------------------+ FV DistalFull           Yes      No                                       +---------+---------------+---------+-----------+----------+-------------------+ PFV      Full                                                             +---------+---------------+---------+-----------+----------+-------------------+ POP      Full           Yes      No                                       +---------+---------------+---------+-----------+----------+-------------------+ PTV      Full                                                             +---------+---------------+---------+-----------+----------+-------------------+  PERO                                                  Not well visualized +---------+---------------+---------+-----------+----------+-------------------+    Summary: RIGHT: - No evidence of common femoral vein obstruction.   LEFT: - There is no evidence of deep vein thrombosis in the lower extremity. However, portions of this examination were limited- see technologist comments above.  - No cystic structure found in the popliteal fossa.  *See table(s) above for measurements and observations.    Preliminary     Procedures Procedures    Medications Ordered in ED Medications  oxyCODONE-acetaminophen (PERCOCET/ROXICET) 5-325 MG per tablet 1 tablet (1 tablet Oral Given 07/25/23 1752)    ED Course/ Medical Decision Making/ A&P                                 Medical Decision Making Amount and/or Complexity of Data Reviewed Radiology: ordered.  Risk Prescription drug management.   Patient with left distal thigh pain.  Differential diagnoses long but includes causes such as bony injury, osteomyelitis, DVT.  Also causes  such as cellulitis.  DVT scan negative.  X-ray does not show clear fracture.  However there was questionable for proximal tibia.  No tenderness in this area however.  Do not think we need knee x-ray and unlikely tibial plateau fracture without weightbearing.  No infection seen.  Appears stable for discharge home with some pain medicine.          Final Clinical Impression(s) / ED Diagnoses Final diagnoses:  Pain of left lower extremity    Rx / DC Orders ED Discharge Orders          Ordered    oxyCODONE-acetaminophen (PERCOCET/ROXICET) 5-325 MG tablet  Every 6 hours PRN        07/25/23 1836              Benjiman Core, MD 07/25/23 2308

## 2023-07-25 NOTE — ED Triage Notes (Signed)
Patient c/o leg pain x 2 days. Patient worsening left leg pain today. Patient report taking tylenol without relief. Patient denies N/V. Patient a/xo4.

## 2023-07-26 ENCOUNTER — Other Ambulatory Visit: Payer: Self-pay | Admitting: Cardiology

## 2023-07-28 ENCOUNTER — Telehealth: Payer: Self-pay

## 2023-07-28 MED ORDER — CALCITRIOL 0.25 MCG PO CAPS: 0.2500 ug | ORAL_CAPSULE | Freq: Two times a day (BID) | ORAL | 3 refills | Status: AC

## 2023-07-28 NOTE — Telephone Encounter (Signed)
Fax received from [facility] requesting PCS for the patient.   He has not seen Dr Alvis Lemmings within the past 90 days, so he will need to see her before we can complete a PCS request.    I called the patient : [phone]  and had to leave a message requesting a call back.

## 2023-08-02 ENCOUNTER — Other Ambulatory Visit: Payer: Self-pay | Admitting: Family Medicine

## 2023-08-03 ENCOUNTER — Ambulatory Visit: Payer: Self-pay | Admitting: Family Medicine

## 2023-08-03 NOTE — Telephone Encounter (Signed)
Copied from CRM [id number]. Topic: Clinical - Red Word Triage >> Aug 03, 2023 11:03 AM Marlow Baars wrote: Red Word that prompted transfer to Nurse Triage: The patient called in to ask why he cannot get his nerve pain medication Lyrica but while trying to make an appt as requested he listed a Red Word of Sever Pain. I transferred the call to [facility] NT   Chief Complaint: Pain Symptoms: Neuropathy pain in both legs along with phantom pain and pain in both hands Frequency: patient states years but he has been out of his medication for his nerve pain for the past few days Pertinent Negatives: Patient denies chest pain, fevers, bowel/bladder changes Disposition: [] ED /[] Urgent Care (no appt availability in office) / [] Appointment(In office/virtual)/ []  [place] [facility]/ [] Home Care/ [x] Refused Recommended Disposition /[] [place] [facility]/ []  Follow-up with PCP Additional Notes: Patient called and advised he needed medication and he is hurting real bad.  He said he has neuropathy and phantom pain in both of his legs and both hands.  He said he is out Gabapentin and needs medicine for the nerve pain. Patient denies any chest pain, fevers, bowel/bladder changes. 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 He states that he goes to dialysis on Tuesdays and Thursdays so he cannot have an appointment tomorrow.  He also states that he has a nurse that comes on Friday to his home and they said he cannot have the nurse come see him on Friday and he have a doctor's appointment on the same day.  Patient wanted to see about next week at the office.  He was advised that the recommendation at this time is that he is seen within the next 3 days, whether he sees someone at his PCP office, goes to an urgent care, or to the emergency room.  He states that he would like to get a refill on his medication Gabapentin and someone give him a call back to possibly schedule an appointment for next week.  He is advised that if  his symptoms get worse to call 911 or go to the emergency room.  This RN advised him that this message would be sent to his PCP for the refill.  Patient is appreciative and wants someone to call him about the refill status and if he can possibly make an appointment next week at the office.  He is advised again that if things get worse, 911 and the ER are there as well.  Patient verbalized understanding.   Reason for Disposition  [1] Numbness or tingling in one or both feet AND [2] is a chronic symptom (recurrent or ongoing AND present > 4 weeks)  Answer Assessment - Initial Assessment Questions 1. ONSET: "When did the pain start?"      A couple of days since not having anymore Lyrica medication 2. LOCATION: "Where is the pain located?"      Both legs and both hands. 3. PAIN: "How bad is the pain?"    (Scale 1-10; or mild, moderate, severe)   -  MILD (1-3): doesn't interfere with normal activities    -  MODERATE (4-7): interferes with normal activities (e.g., work or school) or awakens from sleep, limping    -  SEVERE (8-10): excruciating pain, unable to do any normal activities, unable to walk     8 4. WORK OR EXERCISE: "Has there been any recent work or exercise that involved this part of the body?"  N/a 5. CAUSE: "What do you think is causing the leg pain?"     Neuropathy or phantom pain 6. OTHER SYMPTOMS: "Do you have any other symptoms?" (e.g., chest pain, back pain, breathing difficulty, swelling, rash, fever, numbness, weakness)     N/a  Protocols used: Leg Pain-A-AH, Neurologic Deficit-A-AH

## 2023-08-04 NOTE — Telephone Encounter (Signed)
I called the patient again regarding PCS and had to leave a message requesting a call back.   He has not seen Dr Alvis Lemmings within the past 90 days, so he will need to see her before we can complete a PCS request.

## 2023-08-05 ENCOUNTER — Telehealth: Payer: Self-pay

## 2023-08-05 NOTE — Telephone Encounter (Signed)
Copied from CRM [id number]. Topic: General - Other >> Aug 05, 2023  3:51 PM Patsy Lager T wrote: Reason for CRM: patient returning Judie Grieve call. Please f/u with patient

## 2023-08-07 ENCOUNTER — Ambulatory Visit (HOSPITAL_COMMUNITY)
Admission: EM | Admit: 2023-08-07 | Discharge: 2023-08-07 | Disposition: A | Discharge status: Home / Self Care | Payer: Medicare Other | Attending: Emergency Medicine | Admitting: Emergency Medicine

## 2023-08-07 ENCOUNTER — Encounter (HOSPITAL_COMMUNITY): Payer: Self-pay

## 2023-08-07 DIAGNOSIS — M792 Neuralgia and neuritis, unspecified: Secondary | ICD-10-CM

## 2023-08-07 DIAGNOSIS — G546 Phantom limb syndrome with pain: Secondary | ICD-10-CM

## 2023-08-07 MED ORDER — HYDROCODONE-ACETAMINOPHEN 5-325 MG PO TABS
ORAL_TABLET | ORAL | Status: AC
  Filled 2023-08-07: qty 1

## 2023-08-07 MED ORDER — HYDROCODONE-ACETAMINOPHEN 5-325 MG PO TABS
1.0000 | ORAL_TABLET | Freq: Once | ORAL | Status: AC
  Administered 2023-08-07: 1 via ORAL

## 2023-08-07 MED ORDER — GABAPENTIN 300 MG PO CAPS: 300.0000 mg | ORAL_CAPSULE | Freq: Two times a day (BID) | ORAL | 0 refills | Status: DC

## 2023-08-07 NOTE — ED Triage Notes (Signed)
Patient here today with c/o increasing pain all over his body including phantom pain in his left lower extremity X 2 days.

## 2023-08-07 NOTE — Discharge Instructions (Signed)
I have sent in a refill of your gabapentin.  It is importantly follow-up with your primary care provider for any further refills so they can assess how this medication is working for you and make any adjustments to your other medications.  Please return to clinic for any new or urgent symptoms.

## 2023-08-07 NOTE — ED Provider Notes (Signed)
[facility]    CSN: [id number] Arrival date & time: 08/07/23  1354      History   Chief Complaint Chief Complaint  Patient presents with   Joint Pain    HPI Todd Mccoy is a 53 y.o. male.   Patient presents to clinic with a friend, has had increasing nerve pain since he ran out of gabapentin and has been unable to follow-up with his PCP regarding this issue. 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   No fevers, no cough, sore throat or viral symptoms.  Gabapentin was controlling the nerve pain.  He is wheelchair-bound.  History of end-stage renal disease on dialysis.  The history is provided by the patient and medical records.    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/2020   pt states recent MD visit /left eye worse than right   Dyslipidemia 12/10/2020   Hx of BKA, left (HCC)    Hypertension    Morbid obesity (HCC)    Osteomyelitis (HCC)    Pneumonia    Pulmonary HTN (HCC)    mild to moderate (PASP97mmHg) by echo 07/2023    Patient Active Problem List   Diagnosis Date Noted   Pulmonary HTN (HCC)    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/24/2023   End stage renal disease (HCC) 02/19/2023   Acquired absence of left leg below knee (HCC) 02/14/2023   Acquired absence of other right toe(s) (HCC) 02/14/2023   Dependence on renal dialysis (HCC) 02/14/2023   Hyperlipidemia, unspecified 02/14/2023   Hypertensive heart and chronic kidney disease with heart  failure and with stage 5 chronic kidney disease, or end stage renal disease (HCC) 02/14/2023   Nicotine dependence, cigarettes, uncomplicated 02/14/2023   Type 2 diabetes mellitus with diabetic neuropathy, unspecified (HCC) 02/14/2023   Type 2 diabetes mellitus with diabetic peripheral angiopathy without gangrene (HCC) 02/14/2023   Type 2 diabetes mellitus with unspecified diabetic retinopathy without macular edema (HCC) 02/14/2023   Diarrhea of presumed infectious origin 02/08/2023   Metabolic acidosis 02/08/2023   Anemia of chronic disease 02/08/2023   Acute renal failure superimposed on chronic kidney disease (HCC) 02/08/2023   Encephalopathy acute 02/07/2023   Shock circulatory (HCC) 02/05/2023   PAD (peripheral artery disease) (HCC) 10/27/2022   Renal insufficiency    Acute on chronic systolic CHF (congestive heart failure) (HCC) 01/23/2022   Prolonged QT interval 01/23/2022   Chest pain 01/23/2022   Elevated troponin 01/23/2022   Anemia due to chronic kidney disease 01/23/2022   Hypertensive emergency 01/23/2022   Hyponatremia 01/23/2022   Chronic combined systolic (congestive) and diastolic (congestive) heart failure (HCC) 04/07/2021   Chronic renal disease, stage 4, severely decreased glomerular filtration rate between 15-29 mL/min/1.73 square meter (HCC)    Hypertensive urgency    Acute kidney injury superimposed on chronic kidney disease (HCC)    Acute CHF (congestive heart failure) (HCC) 02/27/2021   Normocytic anemia 02/27/2021  Anasarca 02/27/2021   Abscess of left foot 12/10/2020   S/P BKA (below knee amputation) unilateral, left (HCC) 12/10/2020   Dyslipidemia 12/10/2020   CKD (chronic kidney disease), stage III (HCC) 12/10/2020   HTN (hypertension) 11/12/2020   Cellulitis and abscess of foot    Diabetes mellitus with hyperglycemia (HCC) 09/07/2014   Tobacco abuse 09/07/2014   Abscess of back    Abscess of lower back 09/06/2014   DM2 (diabetes mellitus, type 2)  (HCC) 09/06/2014   Sepsis (HCC) 09/06/2014   Scrotal abscess 06/20/2014    Past Surgical History:  Procedure Laterality Date   ABSCESS DRAINAGE     neck   AMPUTATION Left 11/14/2020   Procedure: LEFT 5TH RAY AMPUTATION;  Surgeon: Nadara Mustard, MD;  Location[facility] OR;  Service: Orthopedics;  Laterality: Left;   AMPUTATION Left 12/10/2020   Procedure: LEFT BELOW KNEE AMPUTATION;  Surgeon: Nadara Mustard, MD;  Location: [facility] OR;  Service: Orthopedics;  Laterality: Left;   AMPUTATION TOE Right 03/25/2020   Procedure: AMPUTATION TOE;  Surgeon: Park Liter, DPM;  Location: [facility] ORS;  Service: Podiatry;  Laterality: Right;   AV FISTULA PLACEMENT Right 05/23/2023   Procedure: RIGHT ARM Brachiocephalic ARTERIOVENOUS (AV) FISTULA CREATION;  Surgeon: Victorino Sparrow, MD;  Location: [facility] OR;  Service: Vascular;  Laterality: Right;   INCISION AND DRAINAGE ABSCESS Right 09/07/2014   Procedure: INCISION AND DRAINAGE ABSCESS RIGHT FLANK;  Surgeon: Avel Peace, MD;  Location: [facility] ORS;  Service: General;  Laterality: Right;   IR FLUORO GUIDE CV LINE RIGHT  02/11/2023   IR US GUIDE VASC ACCESS RIGHT  02/11/2023   LEG AMPUTATION BELOW KNEE Left 12/10/2020   SCROTUM EXPLORATION         Home Medications    Prior to Admission medications   Medication Sig Start Date End Date Taking? Authorizing Provider  acetaminophen (TYLENOL) 325 MG tablet Take 2 tablets (650 mg total) by mouth every 6 (six) hours as needed for mild pain. Patient taking differently: Take 1,000 mg by mouth in the morning and at bedtime. 02/14/23   Azucena Fallen, MD  amLODipine (NORVASC) 10 MG tablet Take 1 tablet (10 mg total) by mouth daily. 06/20/23   [name], MD  atorvastatin (LIPITOR) 20 MG tablet TAKE 1 TABLET (20 MG TOTAL) BY MOUTH DAILY. 05/16/23   Gaston Islam., NP  calcitRIOL (ROCALTROL) 0.25 MCG capsule Take 1 capsule (0.25 mcg total) by mouth 2 (two) times daily. 07/28/23   Quintella Reichert, MD  carvedilol (COREG)  25 MG tablet Take 1 tablet (25 mg total) by mouth 2 (two) times daily with a meal. 05/16/23   Turner, Cornelious Bryant, MD  docusate sodium (COLACE) 100 MG capsule Take 1 capsule (100 mg total) by mouth 2 (two) times daily as needed for mild constipation. 02/14/23   Azucena Fallen, MD  doxycycline (MONODOX) 50 MG capsule Take 50 mg by mouth 2 (two) times daily.    [provider]  ferrous sulfate 325 (65 FE) MG EC tablet Take 1 tablet (325 mg total) by mouth daily with breakfast. 06/20/23   [name], MD  furosemide (LASIX) 40 MG tablet Take 1 tablet (40 mg total) by mouth 2 (two) times daily. 02/14/23   Azucena Fallen, MD  gabapentin (NEURONTIN) 300 MG capsule Take 1 capsule (300 mg total) by mouth 2 (two) times daily. 08/07/23   Jessie Cowher, [place] N, FNP  hydrALAZINE (APRESOLINE) 50 MG tablet Take 50 mg by  mouth 3 (three) times daily. 02/19/23   [provider]  hydrOXYzine (ATARAX) 50 MG tablet Take 1 tablet (50 mg total) by mouth 3 (three) times daily as needed for anxiety or itching. 02/14/23   Azucena Fallen, MD  insulin glargine (LANTUS) 100 UNIT/ML injection Inject 4-5 Units into the skin at bedtime.    [provider]  isosorbide mononitrate (IMDUR) 60 MG 24 hr tablet TAKE ONE TABLET BY MOUTH ONCE DAILY 03/03/23   Gaston Islam., NP  magnesium oxide (MAG-OX) 400 (240 Mg) MG tablet Take 1 tablet (400 mg total) by mouth 2 (two) times daily. 02/14/23   Azucena Fallen, MD  multivitamin (RENA-VIT) TABS tablet Take 1 tablet by mouth at bedtime. 02/14/23   Azucena Fallen, MD  polyethylene glycol (MIRALAX / GLYCOLAX) 17 g packet Take 17 g by mouth daily as needed for moderate constipation. 02/14/23   Azucena Fallen, MD  torsemide (DEMADEX) 20 MG tablet Take 4 tablets (80 mg total) by mouth daily. 05/16/23   Gaston Islam., NP    Family History Family History  Problem Relation Age of Onset   Diabetes Mother    Diabetes Father    Diabetes  Brother    Colon cancer Neg Hx    Esophageal cancer Neg Hx    Pancreatic cancer Neg Hx    Stomach cancer Neg Hx    Liver disease Neg Hx    CAD Neg Hx     Social History Social History   Tobacco Use   Smoking status: Every Day    Current packs/day: 0.00    Average packs/day: 0.5 packs/day for 32.0 years (16.0 ttl pk-yrs)    Types: Cigarettes    Start date: 08/24/1989    Last attempt to quit: 08/24/2021    Years since quitting: 1.9    Passive exposure: Past   Smokeless tobacco: Former   Tobacco comments:    Quit smoking previously February 2023  Vaping Use   Vaping status: Never Used  Substance Use Topics   Alcohol use: No   Drug use: No     Allergies   Patient has no known allergies.   Review of Systems Review of Systems   Physical Exam Triage Vital Signs ED Triage Vitals  Encounter Vitals Group     BP 08/07/23 1436 (!) 143/81     Systolic BP Percentile --      Diastolic BP Percentile --      Pulse Rate 08/07/23 1436 73     Resp 08/07/23 1436 16     Temp 08/07/23 1436 98.5 F (36.9 C)     Temp Source 08/07/23 1436 Oral     SpO2 08/07/23 1436 94 %     Weight 08/07/23 1430 191 lb 12.8 oz (87 kg)     Height 08/07/23 1430 5\' 10"  (1.778 m)     Head Circumference --      Peak Flow --      Pain Score 08/07/23 1429 8     Pain Loc --      Pain Education --      Exclude from Growth Chart --    No data found.  Updated Vital Signs BP (!) 143/81 (BP Location: Right Arm)   Pulse 73   Temp 98.5 F (36.9 C) (Oral)   Resp 16   Ht 5\' 10"  (1.778 m)   Wt 191 lb 12.8 oz (87 kg)   SpO2 94%   BMI 27.52  kg/m   Visual Acuity Right Eye Distance:   Left Eye Distance:   Bilateral Distance:    Right Eye Near:   Left Eye Near:    Bilateral Near:     Physical Exam Vitals and nursing note reviewed.  Constitutional:      Appearance: Normal appearance.  HENT:     Head: Normocephalic and atraumatic.     Right Ear: External ear normal.     Left Ear: External ear  normal.     Nose: Nose normal.     Mouth/Throat:     Mouth: Mucous membranes are moist.  Cardiovascular:     Rate and Rhythm: Normal rate and regular rhythm.     Heart sounds: Normal heart sounds. No murmur heard. Pulmonary:     Effort: Pulmonary effort is normal. No respiratory distress.     Breath sounds: Normal breath sounds.  Skin:    General: Skin is warm and dry.  Neurological:     General: No focal deficit present.     Mental Status: He is alert and oriented to person, place, and time.  Psychiatric:        Mood and Affect: Mood normal.      UC Treatments / Results  Labs (all labs ordered are listed, but only abnormal results are displayed) Labs Reviewed - No data to display  EKG   Radiology No results found.  Procedures Procedures (including critical care time)  Medications Ordered in UC Medications  HYDROcodone-acetaminophen (NORCO/VICODIN) 5-325 MG per tablet 1 tablet (has no administration in time range)    Initial Impression / Assessment and Plan / UC Course  I have reviewed the triage vital signs and the nursing notes.  Pertinent labs & imaging results that were available during my care of the patient were reviewed by me and considered in my medical decision making (see chart for details).  Vitals and triage reviewed, patient is hemodynamically stable.  Lungs are vesicular, heart with regular rate and rhythm.  No congestion or rhinorrhea on physical exam.  Suspect neuropathic pain due to lack of gabapentin, will send in 1 month supply and encouraged follow-up with PCP.  Norco given in clinic for phantom limb pain, encouraged PCP follow-up for further management.  Plan of care, follow-up care return precautions given, no questions at this time.     Final Clinical Impressions(s) / UC Diagnoses   Final diagnoses:  Nerve pain  Phantom limb pain [facility])     Discharge Instructions      I have sent in a refill of your gabapentin.  It is importantly  follow-up with your primary care provider for any further refills so they can assess how this medication is working for you and make any adjustments to your other medications.  Please return to clinic for any new or urgent symptoms.    ED Prescriptions     Medication Sig Dispense Auth. Provider   gabapentin (NEURONTIN) 300 MG capsule Take 1 capsule (300 mg total) by mouth 2 (two) times daily. 60 capsule Evarose Altland, [place] N, [place]      PDMP not reviewed this encounter.   Reiko Vinje, [place] N, [place] 08/07/23 [phone]

## 2023-08-08 NOTE — Telephone Encounter (Signed)
I returned the call to the patient and explained that we received the paperwork for [facility] but because he has not seen a provider here in > 90 days he will need to be evaluated by a provider first.  He said he understood and was scheduled with Corene Cornea, PA for 08/31/2023 @ 0910.   I explained to him that he can call Medicaid for transportation to that appointment and he said he will call.

## 2023-08-09 ENCOUNTER — Telehealth: Payer: Self-pay

## 2023-08-09 NOTE — Telephone Encounter (Signed)
Third attempt to call patient to give results. No answer, no DPR on file. Left message with no identifiers asking recipient to call Isabela at our office #. Letter sent.

## 2023-08-19 ENCOUNTER — Other Ambulatory Visit: Payer: Self-pay

## 2023-08-19 ENCOUNTER — Emergency Department (HOSPITAL_COMMUNITY)
Admission: EM | Admit: 2023-08-19 | Discharge: 2023-08-19 | Disposition: A | Discharge status: Home / Self Care | Payer: Medicare Other | Attending: Emergency Medicine | Admitting: Emergency Medicine

## 2023-08-19 ENCOUNTER — Encounter (HOSPITAL_COMMUNITY): Payer: Self-pay

## 2023-08-19 DIAGNOSIS — M792 Neuralgia and neuritis, unspecified: Secondary | ICD-10-CM | POA: Insufficient documentation

## 2023-08-19 DIAGNOSIS — Z79899 Other long term (current) drug therapy: Secondary | ICD-10-CM | POA: Insufficient documentation

## 2023-08-19 DIAGNOSIS — E1140 Type 2 diabetes mellitus with diabetic neuropathy, unspecified: Secondary | ICD-10-CM | POA: Diagnosis not present

## 2023-08-19 DIAGNOSIS — I110 Hypertensive heart disease with heart failure: Secondary | ICD-10-CM | POA: Insufficient documentation

## 2023-08-19 DIAGNOSIS — I509 Heart failure, unspecified: Secondary | ICD-10-CM | POA: Diagnosis not present

## 2023-08-19 DIAGNOSIS — Z794 Long term (current) use of insulin: Secondary | ICD-10-CM | POA: Diagnosis not present

## 2023-08-19 MED ORDER — OXYCODONE-ACETAMINOPHEN 5-325 MG PO TABS
1.0000 | ORAL_TABLET | Freq: Once | ORAL | Status: AC
Start: 2023-08-19 — End: 2023-08-19
  Administered 2023-08-19: 1 via ORAL
  Filled 2023-08-19: qty 1

## 2023-08-19 NOTE — Discharge Instructions (Signed)
Please follow-up with your primary care provider in regards recent ER visit.  Today your exam is reassuring and most likely have neuropathic pain.  You can discuss with your primary care provider about the dosage of your gabapentin to see if this will help.  If symptoms change or worsen please return to the ER.

## 2023-08-19 NOTE — ED Triage Notes (Signed)
Patient is here for evaluation of phantom pain and nerve pain. Reports been going on "for awhile."

## 2023-08-19 NOTE — ED Provider Notes (Signed)
[place] EMERGENCY DEPARTMENT AT [facility] Provider Note   CSN: [id number] Arrival date & time: 08/19/23  1426     History  Chief Complaint  Patient presents with   Foot Pain    Todd Mccoy is a 53 y.o. male history of below the knee potation the left side, pulm hypertension, diabetes, CHF, diabetic neuropathy presented with neuropathic pain and both legs.  Patient states been going on for months and that he is currently on gabapentin and sees his primary care provider next week.  Patient does not believe the medications at home are helping and wants be evaluated.  Patient denies any fevers, wounds or drainage or calf tenderness or swelling.  Patient's been seen twice for this in the past month and states the pain has remained the same.   Home Medications Prior to Admission medications   Medication Sig Start Date End Date Taking? Authorizing Provider  acetaminophen (TYLENOL) 325 MG tablet Take 2 tablets (650 mg total) by mouth every 6 (six) hours as needed for mild pain. Patient taking differently: Take 1,000 mg by mouth in the morning and at bedtime. 02/14/23   Azucena Fallen, MD  amLODipine (NORVASC) 10 MG tablet Take 1 tablet (10 mg total) by mouth daily. 06/20/23   [name], MD  atorvastatin (LIPITOR) 20 MG tablet TAKE 1 TABLET (20 MG TOTAL) BY MOUTH DAILY. 05/16/23   Gaston Islam., NP  calcitRIOL (ROCALTROL) 0.25 MCG capsule Take 1 capsule (0.25 mcg total) by mouth 2 (two) times daily. 07/28/23   Quintella Reichert, MD  carvedilol (COREG) 25 MG tablet Take 1 tablet (25 mg total) by mouth 2 (two) times daily with a meal. 05/16/23   Turner, Cornelious Bryant, MD  docusate sodium (COLACE) 100 MG capsule Take 1 capsule (100 mg total) by mouth 2 (two) times daily as needed for mild constipation. 02/14/23   Azucena Fallen, MD  doxycycline (MONODOX) 50 MG capsule Take 50 mg by mouth 2 (two) times daily.    [provider]  ferrous sulfate 325 (65 FE)  MG EC tablet Take 1 tablet (325 mg total) by mouth daily with breakfast. 06/20/23   [name], MD  furosemide (LASIX) 40 MG tablet Take 1 tablet (40 mg total) by mouth 2 (two) times daily. 02/14/23   Azucena Fallen, MD  gabapentin (NEURONTIN) 300 MG capsule Take 1 capsule (300 mg total) by mouth 2 (two) times daily. 08/07/23   Garrison, [place] N, FNP  hydrALAZINE (APRESOLINE) 50 MG tablet Take 50 mg by mouth 3 (three) times daily. 02/19/23   [provider]  hydrOXYzine (ATARAX) 50 MG tablet Take 1 tablet (50 mg total) by mouth 3 (three) times daily as needed for anxiety or itching. 02/14/23   Azucena Fallen, MD  insulin glargine (LANTUS) 100 UNIT/ML injection Inject 4-5 Units into the skin at bedtime.    [provider]  isosorbide mononitrate (IMDUR) 60 MG 24 hr tablet TAKE ONE TABLET BY MOUTH ONCE DAILY 03/03/23   Gaston Islam., NP  magnesium oxide (MAG-OX) 400 (240 Mg) MG tablet Take 1 tablet (400 mg total) by mouth 2 (two) times daily. 02/14/23   Azucena Fallen, MD  multivitamin (RENA-VIT) TABS tablet Take 1 tablet by mouth at bedtime. 02/14/23   Azucena Fallen, MD  polyethylene glycol (MIRALAX / GLYCOLAX) 17 g packet Take 17 g by mouth daily as needed for moderate constipation. 02/14/23   Azucena Fallen, MD  torsemide (DEMADEX) 20 MG tablet Take 4 tablets (80 mg total) by mouth daily. 05/16/23   Gaston Islam., NP      Allergies    Patient has no known allergies.    Review of Systems   Review of Systems  Physical Exam Updated Vital Signs BP (!) 164/100 (BP Location: Left Arm)   Pulse 73   Temp 98 F (36.7 C) (Oral)   Resp 18   Ht 5\' 10"  (1.778 m)   Wt 87 kg   SpO2 94%   BMI 27.52 kg/m  Physical Exam Constitutional:      General: He is not in acute distress. Musculoskeletal:        General: Normal range of motion.     Comments: Compartment soft Pain not out of proportion 5 out of 5 right hip flexion, knee  extension/flexion, plantarflexion/dorsiflexion 5 out of 5 left-sided hip flexion, knee extension/flexion Left-sided below the knee amputation Lower extremities were nontender to palpation No right calf tenderness No lower extremity edema noted  Skin:    General: Skin is warm and dry.     Capillary Refill: Capillary refill takes less than 2 seconds.     Comments: No signs of wounds, warmth, purulence, fluctuance or other skin color changes  Neurological:     Mental Status: He is alert and oriented to person, place, and time.     Comments: Sensation intact distally  Psychiatric:        Mood and Affect: Mood normal.     ED Results / Procedures / Treatments   Labs (all labs ordered are listed, but only abnormal results are displayed) Labs Reviewed - No data to display  EKG None  Radiology No results found.  Procedures Procedures    Medications Ordered in ED Medications  oxyCODONE-acetaminophen (PERCOCET/ROXICET) 5-325 MG per tablet 1 tablet (has no administration in time range)    ED Course/ Medical Decision Making/ A&P                                 Medical Decision Making Risk Prescription drug management.   Todd Mccoy 53 y.o. presented today for neuropathic pain in both legs. Working DDx that I considered at this time includes, but not limited to, neuropathy, diabetic wounds, cellulitis, abscess.  R/o DDx: iabetic wounds, cellulitis, abscess: These are considered less likely due to history of present illness, physical exam, labs/imaging findings  Review of prior external notes: 07/25/2023 ED  Unique Tests and My Independent Interpretation: None  Social Determinants of Health: unemployed  Discussion with Independent Historian:  Family member  Discussion of Management of Tests: None  Risk: Medium: prescription drug management  Risk Stratification Score: None  Plan: On exam patient was no acute distress with stable vitals.  Patient did have  good capillary refill without overlying skin color changes or wounds.  Patient states that this is the same pain he has had for the past month but states this is also a chronic pain is this is his phantom leg pain.  Patient is currently on gabapentin and sees his primary care provider next week but would like a dose of pain meds his his pain meds at home are not working which is reasonable.  Patient's had x-rays and ultrasounds are negative and after discussion with the patient we agreed to forego any imaging or labs as patient describes this as a shooting pain that is similar  to his neuropathic pain and with no concerning features on exam do not believe he would benefit from further workup.  Will give 1 dose of pain meds here and discharge with primary care follow-up.  Patient was given return precautions. Patient stable for discharge at this time.  Patient verbalized understanding of plan.  This chart was dictated using voice recognition software.  Despite best efforts to proofread,  errors can occur which can change the documentation meaning.         Final Clinical Impression(s) / ED Diagnoses Final diagnoses:  Nerve pain    Rx / DC Orders ED Discharge Orders     None         Remi Deter 08/19/23 1721    Benjiman Core, MD 08/19/23 [phone]

## 2023-08-24 ENCOUNTER — Encounter: Payer: Self-pay | Admitting: Physical Medicine and Rehabilitation

## 2023-08-31 ENCOUNTER — Encounter: Payer: Self-pay | Admitting: Physician Assistant

## 2023-08-31 ENCOUNTER — Ambulatory Visit: Payer: Medicare Other | Attending: Physician Assistant | Admitting: Physician Assistant

## 2023-08-31 VITALS — BP 130/73 | HR 83 | Ht 70.0 in

## 2023-08-31 DIAGNOSIS — I10 Essential (primary) hypertension: Secondary | ICD-10-CM

## 2023-08-31 DIAGNOSIS — I5023 Acute on chronic systolic (congestive) heart failure: Secondary | ICD-10-CM

## 2023-08-31 DIAGNOSIS — Z992 Dependence on renal dialysis: Secondary | ICD-10-CM

## 2023-08-31 DIAGNOSIS — D638 Anemia in other chronic diseases classified elsewhere: Secondary | ICD-10-CM

## 2023-08-31 DIAGNOSIS — R4589 Other symptoms and signs involving emotional state: Secondary | ICD-10-CM

## 2023-08-31 DIAGNOSIS — E1165 Type 2 diabetes mellitus with hyperglycemia: Secondary | ICD-10-CM

## 2023-08-31 DIAGNOSIS — N184 Chronic kidney disease, stage 4 (severe): Secondary | ICD-10-CM

## 2023-08-31 DIAGNOSIS — R52 Pain, unspecified: Secondary | ICD-10-CM

## 2023-08-31 DIAGNOSIS — Z89512 Acquired absence of left leg below knee: Secondary | ICD-10-CM | POA: Diagnosis not present

## 2023-08-31 DIAGNOSIS — N186 End stage renal disease: Secondary | ICD-10-CM

## 2023-08-31 DIAGNOSIS — E785 Hyperlipidemia, unspecified: Secondary | ICD-10-CM

## 2023-08-31 DIAGNOSIS — E559 Vitamin D deficiency, unspecified: Secondary | ICD-10-CM

## 2023-08-31 DIAGNOSIS — E1122 Type 2 diabetes mellitus with diabetic chronic kidney disease: Secondary | ICD-10-CM

## 2023-08-31 DIAGNOSIS — Z794 Long term (current) use of insulin: Secondary | ICD-10-CM

## 2023-08-31 LAB — POCT GLYCOSYLATED HEMOGLOBIN (HGB A1C): HbA1c, POC (controlled diabetic range): 7 % (ref 0.0–7.0)

## 2023-08-31 LAB — GLUCOSE, POCT (MANUAL RESULT ENTRY): POC Glucose: 162 mg/dL — AB (ref 70–99)

## 2023-08-31 MED ORDER — INSULIN GLARGINE 100 UNIT/ML ~~LOC~~ SOLN: 5.0000 [IU] | Freq: Every day | SUBCUTANEOUS | 2 refills | Status: DC

## 2023-08-31 MED ORDER — FERROUS SULFATE 325 (65 FE) MG PO TBEC: 325.0000 mg | DELAYED_RELEASE_TABLET | Freq: Every day | ORAL | 3 refills | Status: AC

## 2023-08-31 MED ORDER — HYDROXYZINE HCL 50 MG PO TABS: 50.0000 mg | ORAL_TABLET | Freq: Three times a day (TID) | ORAL | 2 refills | Status: DC | PRN

## 2023-08-31 MED ORDER — ISOSORBIDE MONONITRATE ER 60 MG PO TB24: 60.0000 mg | ORAL_TABLET | Freq: Every day | ORAL | 2 refills | Status: DC

## 2023-08-31 MED ORDER — GABAPENTIN 300 MG PO CAPS: 600.0000 mg | ORAL_CAPSULE | Freq: Two times a day (BID) | ORAL | 3 refills | Status: DC

## 2023-08-31 MED ORDER — FUROSEMIDE 40 MG PO TABS: 40.0000 mg | ORAL_TABLET | Freq: Two times a day (BID) | ORAL | 2 refills | Status: DC

## 2023-08-31 NOTE — Progress Notes (Signed)
 Patient ID: Todd Mccoy, male   DOB: 14-Apr-1971, 53 y.o.   MRN: [id number]   Todd Mccoy, is a 53 y.o. male  [id number]  [id number]  DOB - 1971/05/02  Chief Complaint  Patient presents with   Diabetes    DM & HTN f/u.  Requesting PCS services Growing breakout on face        Subjective:   Todd Mccoy is a 53 y.o. male here today for med RF and all over body pain.  Pain has increased over the last 2 months.  Dose of gabapentin no longer helping phantom pain.  No fevers.  ESRD on HD Tues, Th, sat.  He is taking 2 to 4 units of lantus daily  No problems updated.  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 Authorizing Provider  acetaminophen (TYLENOL) 325 MG tablet Take 2 tablets (650 mg total) by mouth every 6 (six) hours as needed for mild pain. Patient taking differently: Take 1,000 mg by mouth in the morning and at bedtime. 02/14/23  Yes Azucena Fallen, MD  amLODipine (NORVASC) 10 MG tablet Take 1 tablet (10 mg total) by mouth daily. 06/20/23  Yes [name],  Odette Horns, MD  atorvastatin (LIPITOR) 20 MG tablet TAKE 1 TABLET (20 MG TOTAL) BY MOUTH DAILY. 05/16/23  Yes Gaston Islam., NP  carvedilol (COREG) 25 MG tablet Take 1 tablet (25 mg total) by mouth 2 (two) times daily with a meal. 05/16/23  Yes Turner, Cornelious Bryant, MD  magnesium oxide (MAG-OX) 400 (240 Mg) MG tablet Take 1 tablet (400 mg total) by mouth 2 (two) times daily. 02/14/23  Yes Azucena Fallen, MD  multivitamin (RENA-VIT) TABS tablet Take 1 tablet by mouth at bedtime. 02/14/23  Yes Azucena Fallen, MD  polyethylene glycol (MIRALAX / GLYCOLAX) 17 g packet Take 17 g by mouth daily as needed for moderate constipation. 02/14/23  Yes Azucena Fallen, MD  torsemide (DEMADEX) 20 MG tablet Take 4 tablets (80 mg total) by mouth daily. 05/16/23  Yes Gaston Islam., NP  calcitRIOL (ROCALTROL) 0.25 MCG capsule Take 1 capsule (0.25 mcg total) by mouth 2 (two) times daily. 07/28/23   Quintella Reichert, MD  docusate sodium (COLACE) 100 MG capsule Take 1 capsule (100 mg total) by mouth 2 (two) times daily as needed for mild constipation. Patient not taking: Reported on 08/31/2023 02/14/23   Azucena Fallen, MD  doxycycline (MONODOX) 50 MG capsule  Take 50 mg by mouth 2 (two) times daily. Patient not taking: Reported on 08/31/2023    [provider]  ferrous sulfate 325 (65 FE) MG EC tablet Take 1 tablet (325 mg total) by mouth daily with breakfast. 08/31/23   Anders Simmonds, PA-C  furosemide (LASIX) 40 MG tablet Take 1 tablet (40 mg total) by mouth 2 (two) times daily. 08/31/23   Anders Simmonds, PA-C  gabapentin (NEURONTIN) 300 MG capsule Take 2 capsules (600 mg total) by mouth 2 (two) times daily. 08/31/23   Anders Simmonds, PA-C  hydrALAZINE (APRESOLINE) 50 MG tablet Take 50 mg by mouth 3 (three) times daily. 02/19/23   [provider]  hydrOXYzine (ATARAX) 50 MG tablet Take 1 tablet (50 mg total) by mouth 3 (three) times daily as needed for anxiety or itching. 08/31/23    Anders Simmonds, PA-C  insulin glargine (LANTUS) 100 UNIT/ML injection Inject 0.05 mLs (5 Units total) into the skin at bedtime. 08/31/23   Anders Simmonds, PA-C  isosorbide mononitrate (IMDUR) 60 MG 24 hr tablet Take 1 tablet (60 mg total) by mouth daily. 08/31/23   Anders Simmonds, PA-C    ROS: Neg HEENT Neg resp Neg cardiac Neg GI Neg GU  Neg psych Neg neuro  Objective:   Vitals:   08/31/23 0925  BP: 130/73  Pulse: 83  TempSrc: Oral  SpO2: 98%  Height: 5\' 10"  (1.778 m)   Exam General appearance : Awake, alert, not in any distress. Speech Clear. Not toxic looking, in wheelchair.  Appears weak and in poor overall health.   HEENT: Atraumatic and Normocephalic Neck: Supple, no JVD. No cervical lymphadenopathy.  Chest: Good air entry bilaterally, CTAB.  No rales/rhonchi/wheezing CVS: S1 S2 regular, no murmurs.  L BKA Neurology: Awake alert, and oriented X 3, CN II-XII intact, Non focal Skin: No Rash  Data Review Lab Results  Component Value Date   HGBA1C 7.0 08/31/2023   HGBA1C 5.6 02/06/2023   HGBA1C 6.9 10/27/2022    Assessment & Plan   1. Type 2 diabetes mellitus with hyperglycemia, with long-term current use of insulin (HCC) (Primary) Not at goal-do 5 units daily - Glucose (CBG) - HgB A1c - insulin glargine (LANTUS) 100 UNIT/ML injection; Inject 0.05 mLs (5 Units total) into the skin at bedtime.  Dispense: 10 mL; Refill: 2 - Comprehensive metabolic panel - Lipid Panel  2. Long term (current) use of insulin (HCC) lanus  3. S/P BKA (below knee amputation) unilateral, left (HCC) Increase dose - gabapentin (NEURONTIN) 300 MG capsule; Take 2 capsules (600 mg total) by mouth 2 (two) times daily.  Dispense: 120 capsule; Refill: 3  4. Acute on chronic systolic (congestive) heart failure (HCC) - furosemide (LASIX) 40 MG tablet; Take 1 tablet (40 mg total) by mouth 2 (two) times daily.  Dispense: 60 tablet; Refill: 2 - isosorbide mononitrate (IMDUR) 60 MG 24  hr tablet; Take 1 tablet (60 mg total) by mouth daily.  Dispense: 60 tablet; Refill: 2  5. Hypertension associated with stage 4 chronic kidney disease due to type 2 diabetes mellitus (HCC)  6. Dyslipidemia - Comprehensive metabolic panel - Lipid Panel -let us know when statin needs RF  7. ESRD on hemodialysis (HCC) continue  8. Anemia of chronic disease - ferrous sulfate 325 (65 FE) MG EC tablet; Take 1 tablet (325 mg total) by mouth daily with breakfast.  Dispense: 30 tablet; Refill: 3  9. Anxiety about health - hydrOXYzine (ATARAX) 50 MG tablet;  Take 1 tablet (50 mg total) by mouth 3 (three) times daily as needed for anxiety or itching.  Dispense: 30 tablet; Refill: 2  10. Vitamin D deficiency - Vitamin D, 25-hydroxy  11. Body aches - TSH -increase dose gabapentin    Return in about 4 months (around 12/29/2023) for PCP for chronic conditions.  The patient was given clear instructions to go to ER or return to medical center if symptoms don't improve, worsen or new problems develop. The patient verbalized understanding. The patient was told to call to get lab results if they haven't heard anything in the next week.      Georgian Co, PA-C [facility] and [facility] [place], [name] [phone]   08/31/2023, 9:52 AM

## 2023-08-31 NOTE — Patient Instructions (Signed)
 I have increase your dose of gabapentin

## 2023-09-01 ENCOUNTER — Telehealth: Payer: Self-pay

## 2023-09-01 ENCOUNTER — Other Ambulatory Visit: Payer: Self-pay | Admitting: Physician Assistant

## 2023-09-01 ENCOUNTER — Encounter: Payer: Self-pay | Admitting: Physician Assistant

## 2023-09-01 DIAGNOSIS — R748 Abnormal levels of other serum enzymes: Secondary | ICD-10-CM

## 2023-09-01 DIAGNOSIS — E559 Vitamin D deficiency, unspecified: Secondary | ICD-10-CM

## 2023-09-01 DIAGNOSIS — N186 End stage renal disease: Secondary | ICD-10-CM

## 2023-09-01 DIAGNOSIS — R52 Pain, unspecified: Secondary | ICD-10-CM

## 2023-09-01 LAB — LIPID PANEL
Chol/HDL Ratio: 2.1 {ratio} (ref 0.0–5.0)
Cholesterol, Total: 72 mg/dL — ABNORMAL LOW (ref 100–199)
HDL: 35 mg/dL — ABNORMAL LOW (ref >39)
LDL Chol Calc (NIH): 25 mg/dL (ref 0–99)
Triglycerides: 44 mg/dL (ref 0–149)
VLDL Cholesterol Cal: 12 mg/dL (ref 5–40)

## 2023-09-01 LAB — COMPREHENSIVE METABOLIC PANEL
ALT: 14 [IU]/L (ref 0–44)
AST: 28 [IU]/L (ref 0–40)
Albumin: 3.6 g/dL — ABNORMAL LOW (ref 3.8–4.9)
Alkaline Phosphatase: 297 [IU]/L — ABNORMAL HIGH (ref 44–121)
BUN/Creatinine Ratio: 10 (ref 9–20)
BUN: 37 mg/dL — ABNORMAL HIGH (ref 6–24)
Bilirubin Total: 0.9 mg/dL (ref 0.0–1.2)
CO2: 20 mmol/L (ref 20–29)
Calcium: 8.5 mg/dL — ABNORMAL LOW (ref 8.7–10.2)
Chloride: 94 mmol/L — ABNORMAL LOW (ref 96–106)
Creatinine, Ser: 3.84 mg/dL — ABNORMAL HIGH (ref 0.76–1.27)
Globulin, Total: 3.6 g/dL (ref 1.5–4.5)
Glucose: 158 mg/dL — ABNORMAL HIGH (ref 70–99)
Potassium: 4.3 mmol/L (ref 3.5–5.2)
Sodium: 137 mmol/L (ref 134–144)
Total Protein: 7.2 g/dL (ref 6.0–8.5)
eGFR: 18 mL/min/{1.73_m2} — ABNORMAL LOW (ref >59)

## 2023-09-01 LAB — TSH: TSH: 1.16 u[IU]/mL (ref 0.450–4.500)

## 2023-09-01 LAB — VITAMIN D 25 HYDROXY (VIT D DEFICIENCY, FRACTURES): Vit D, 25-Hydroxy: 10.5 ng/mL — ABNORMAL LOW (ref 30.0–100.0)

## 2023-09-01 NOTE — Telephone Encounter (Signed)
 Completed PCS request faxed to [place] [facility]

## 2023-09-02 ENCOUNTER — Telehealth: Payer: Self-pay

## 2023-09-02 NOTE — Telephone Encounter (Signed)
 Pt was called and is aware of results, DOB was confirmed.  ?

## 2023-09-02 NOTE — Telephone Encounter (Signed)
-----   Message from Georgian Co sent at 09/01/2023  6:26 PM EST ----- Please call patient. Vitamin D is very low.  This can contribute to muscle aches, anxiety, fatigue, and depression.  Make sure you are taking calcitrol 2 times daily!  You have a very elevated alkaline phosphatase level which can indicate bone or liver disease and can be found in patients with body pain.  I am ordering labs for you to have done in 2-3 weeks.   Please do not drink alcohol/beer/wine,etc.  And please come for your labs on a day you have had dialysis(same day as dialysis but after dialysis-not before) for [place] to do further testing.  Thanks, Georgian Co, PA-C

## 2023-09-05 ENCOUNTER — Other Ambulatory Visit: Payer: Self-pay | Admitting: Cardiology

## 2023-09-09 ENCOUNTER — Telehealth: Payer: Self-pay | Admitting: Family Medicine

## 2023-09-09 NOTE — Telephone Encounter (Signed)
 Contacted pt confirm appt!

## 2023-09-13 ENCOUNTER — Ambulatory Visit: Payer: Medicare Other | Attending: Family Medicine

## 2023-09-13 DIAGNOSIS — R748 Abnormal levels of other serum enzymes: Secondary | ICD-10-CM

## 2023-09-13 DIAGNOSIS — N186 End stage renal disease: Secondary | ICD-10-CM

## 2023-09-14 ENCOUNTER — Encounter: Payer: Self-pay | Admitting: Physician Assistant

## 2023-09-14 ENCOUNTER — Other Ambulatory Visit: Payer: Self-pay | Admitting: Physician Assistant

## 2023-09-14 DIAGNOSIS — R748 Abnormal levels of other serum enzymes: Secondary | ICD-10-CM

## 2023-09-14 LAB — GAMMA GT: GGT: 127 IU/L — ABNORMAL HIGH (ref 0–65)

## 2023-09-14 LAB — COMPREHENSIVE METABOLIC PANEL
ALT: 14 IU/L (ref 0–44)
AST: 17 IU/L (ref 0–40)
Albumin: 3.6 g/dL — ABNORMAL LOW (ref 3.8–4.9)
Alkaline Phosphatase: 339 IU/L — ABNORMAL HIGH (ref 44–121)
BUN/Creatinine Ratio: 7 — ABNORMAL LOW (ref 9–20)
BUN: 27 mg/dL — ABNORMAL HIGH (ref 6–24)
Bilirubin Total: 1.1 mg/dL (ref 0.0–1.2)
CO2: 22 mmol/L (ref 20–29)
Calcium: 8.6 mg/dL — ABNORMAL LOW (ref 8.7–10.2)
Chloride: 97 mmol/L (ref 96–106)
Creatinine, Ser: 3.69 mg/dL — ABNORMAL HIGH (ref 0.76–1.27)
Globulin, Total: 3.6 g/dL (ref 1.5–4.5)
Glucose: 109 mg/dL — ABNORMAL HIGH (ref 70–99)
Potassium: 4.1 mmol/L (ref 3.5–5.2)
Sodium: 137 mmol/L (ref 134–144)
Total Protein: 7.2 g/dL (ref 6.0–8.5)
eGFR: 19 mL/min/{1.73_m2} — ABNORMAL LOW (ref >59)

## 2023-09-14 LAB — HEPATITIS C ANTIBODY: Hep C Virus Ab: NONREACTIVE

## 2023-09-21 ENCOUNTER — Other Ambulatory Visit: Payer: Self-pay

## 2023-09-21 ENCOUNTER — Emergency Department (HOSPITAL_COMMUNITY)
Admission: EM | Admit: 2023-09-21 | Discharge: 2023-09-21 | Disposition: A | Discharge status: Home / Self Care | Attending: Emergency Medicine | Admitting: Emergency Medicine

## 2023-09-21 ENCOUNTER — Encounter (HOSPITAL_COMMUNITY): Payer: Self-pay | Admitting: Emergency Medicine

## 2023-09-21 DIAGNOSIS — R7989 Other specified abnormal findings of blood chemistry: Secondary | ICD-10-CM | POA: Diagnosis not present

## 2023-09-21 DIAGNOSIS — L989 Disorder of the skin and subcutaneous tissue, unspecified: Secondary | ICD-10-CM | POA: Diagnosis not present

## 2023-09-21 DIAGNOSIS — Z89512 Acquired absence of left leg below knee: Secondary | ICD-10-CM | POA: Diagnosis not present

## 2023-09-21 DIAGNOSIS — E1122 Type 2 diabetes mellitus with diabetic chronic kidney disease: Secondary | ICD-10-CM | POA: Insufficient documentation

## 2023-09-21 DIAGNOSIS — I120 Hypertensive chronic kidney disease with stage 5 chronic kidney disease or end stage renal disease: Secondary | ICD-10-CM | POA: Diagnosis not present

## 2023-09-21 DIAGNOSIS — R600 Localized edema: Secondary | ICD-10-CM | POA: Insufficient documentation

## 2023-09-21 DIAGNOSIS — F17200 Nicotine dependence, unspecified, uncomplicated: Secondary | ICD-10-CM | POA: Insufficient documentation

## 2023-09-21 DIAGNOSIS — N186 End stage renal disease: Secondary | ICD-10-CM | POA: Diagnosis not present

## 2023-09-21 DIAGNOSIS — Z992 Dependence on renal dialysis: Secondary | ICD-10-CM | POA: Diagnosis not present

## 2023-09-21 DIAGNOSIS — Z794 Long term (current) use of insulin: Secondary | ICD-10-CM | POA: Diagnosis not present

## 2023-09-21 DIAGNOSIS — R140 Abdominal distension (gaseous): Secondary | ICD-10-CM | POA: Diagnosis present

## 2023-09-21 DIAGNOSIS — Z79899 Other long term (current) drug therapy: Secondary | ICD-10-CM | POA: Diagnosis not present

## 2023-09-21 LAB — CBC WITH DIFFERENTIAL/PLATELET
Abs Immature Granulocytes: 0.01 10*3/uL (ref 0.00–0.07)
Basophils Absolute: 0 10*3/uL (ref 0.0–0.1)
Basophils Relative: 0 %
Eosinophils Absolute: 0 10*3/uL (ref 0.0–0.5)
Eosinophils Relative: 1 %
HCT: 38.4 % — ABNORMAL LOW (ref 39.0–52.0)
Hemoglobin: 11.9 g/dL — ABNORMAL LOW (ref 13.0–17.0)
Immature Granulocytes: 0 %
Lymphocytes Relative: 23 %
Lymphs Abs: 0.8 10*3/uL (ref 0.7–4.0)
MCH: 27.9 pg (ref 26.0–34.0)
MCHC: 31 g/dL (ref 30.0–36.0)
MCV: 90.1 fL (ref 80.0–100.0)
Monocytes Absolute: 0.4 10*3/uL (ref 0.1–1.0)
Monocytes Relative: 10 %
Neutro Abs: 2.4 10*3/uL (ref 1.7–7.7)
Neutrophils Relative %: 66 %
Platelets: 132 10*3/uL — ABNORMAL LOW (ref 150–400)
RBC: 4.26 MIL/uL (ref 4.22–5.81)
RDW: 24.3 % — ABNORMAL HIGH (ref 11.5–15.5)
WBC: 3.7 10*3/uL — ABNORMAL LOW (ref 4.0–10.5)
nRBC: 0 % (ref 0.0–0.2)

## 2023-09-21 LAB — COMPREHENSIVE METABOLIC PANEL
ALT: 15 U/L (ref 0–44)
AST: 25 U/L (ref 15–41)
Albumin: 3 g/dL — ABNORMAL LOW (ref 3.5–5.0)
Alkaline Phosphatase: 237 U/L — ABNORMAL HIGH (ref 38–126)
Anion gap: 16 — ABNORMAL HIGH (ref 5–15)
BUN: 28 mg/dL — ABNORMAL HIGH (ref 6–20)
CO2: 22 mmol/L (ref 22–32)
Calcium: 8.9 mg/dL (ref 8.9–10.3)
Chloride: 96 mmol/L — ABNORMAL LOW (ref 98–111)
Creatinine, Ser: 4.58 mg/dL — ABNORMAL HIGH (ref 0.61–1.24)
GFR, Estimated: 15 mL/min — ABNORMAL LOW (ref >60)
Glucose, Bld: 86 mg/dL (ref 70–99)
Potassium: 4 mmol/L (ref 3.5–5.1)
Sodium: 134 mmol/L — ABNORMAL LOW (ref 135–145)
Total Bilirubin: 1.7 mg/dL — ABNORMAL HIGH (ref 0.0–1.2)
Total Protein: 7.9 g/dL (ref 6.5–8.1)

## 2023-09-21 LAB — BRAIN NATRIURETIC PEPTIDE: B Natriuretic Peptide: >4500 pg/mL — ABNORMAL HIGH (ref 0.0–100.0)

## 2023-09-21 NOTE — Discharge Instructions (Addendum)
 Your workup tonight showed mild abdominal distention.  This may be due to fluid retention and may be due to gas/bloating.  Please follow-up your primary care provider for further evaluation.  We discussed possible hemodialysis and diuretics but you declined tonight which is reasonable.  Follow-up with dermatology and primary care for further evaluation of your scalp lesions. If you develop any life-threatening symptoms please return to the emergency department.

## 2023-09-21 NOTE — ED Notes (Signed)
 Pt requested to sit in chair because bed uncomfortable , this nurse assisted with no issues.

## 2023-09-21 NOTE — ED Triage Notes (Signed)
 Pt c/o excess fluid in his abdomen x 1 week and bumps on his face.

## 2023-09-21 NOTE — ED Provider Notes (Signed)
 [place] EMERGENCY DEPARTMENT AT [facility] Provider Note   CSN: [id number] Arrival date & time: 09/21/23  0056     History  No chief complaint on file.   Todd Mccoy is a 53 y.o. male.  Patient with history of diabetes, hypertension, CKD, left BKA who currently receives dialysis presenting for "fluid retention" in his abdomen and right leg for the past 5 days and a week of nodules on his head and face. Patient last received dialysis this morning which he states reduced the abdominal distention. Last bowel movement was this evening described as loose. He also tried taking a laxative which assisted in reducing abdomen size. Patient denies abdominal pain, nausea, or vomiting. Patient denies CP or SOB. Currently smokes 1 pack per day.   HPI     Home Medications Prior to Admission medications   Medication Sig Start Date End Date Taking? Authorizing Provider  acetaminophen (TYLENOL) 325 MG tablet Take 2 tablets (650 mg total) by mouth every 6 (six) hours as needed for mild pain. Patient taking differently: Take 1,000 mg by mouth in the morning and at bedtime. 02/14/23   Azucena Fallen, MD  amLODipine (NORVASC) 10 MG tablet Take 1 tablet (10 mg total) by mouth daily. 06/20/23   [name], MD  atorvastatin (LIPITOR) 20 MG tablet TAKE 1 TABLET (20 MG TOTAL) BY MOUTH DAILY. 05/16/23   Gaston Islam., NP  calcitRIOL (ROCALTROL) 0.25 MCG capsule Take 1 capsule (0.25 mcg total) by mouth 2 (two) times daily. 07/28/23   Quintella Reichert, MD  carvedilol (COREG) 25 MG tablet TAKE 1 TABLET BY MOUTH TWICE DAILY WITH A MEAL . APPOINTMENT REQUIRED FOR FUTURE REFILLS 09/07/23   Quintella Reichert, MD  docusate sodium (COLACE) 100 MG capsule Take 1 capsule (100 mg total) by mouth 2 (two) times daily as needed for mild constipation. Patient not taking: Reported on 08/31/2023 02/14/23   Azucena Fallen, MD  doxycycline (MONODOX) 50 MG capsule Take 50 mg by mouth 2 (two) times  daily. Patient not taking: Reported on 08/31/2023    [provider]  ferrous sulfate 325 (65 FE) MG EC tablet Take 1 tablet (325 mg total) by mouth daily with breakfast. 08/31/23   Anders Simmonds, PA-C  furosemide (LASIX) 40 MG tablet Take 1 tablet (40 mg total) by mouth 2 (two) times daily. 08/31/23   Anders Simmonds, PA-C  gabapentin (NEURONTIN) 300 MG capsule Take 2 capsules (600 mg total) by mouth 2 (two) times daily. 08/31/23   Anders Simmonds, PA-C  hydrALAZINE (APRESOLINE) 50 MG tablet Take 50 mg by mouth 3 (three) times daily. 02/19/23   [provider]  hydrOXYzine (ATARAX) 50 MG tablet Take 1 tablet (50 mg total) by mouth 3 (three) times daily as needed for anxiety or itching. 08/31/23   Anders Simmonds, PA-C  insulin glargine (LANTUS) 100 UNIT/ML injection Inject 0.05 mLs (5 Units total) into the skin at bedtime. 08/31/23   Anders Simmonds, PA-C  isosorbide mononitrate (IMDUR) 60 MG 24 hr tablet Take 1 tablet (60 mg total) by mouth daily. 08/31/23   Anders Simmonds, PA-C  magnesium oxide (MAG-OX) 400 (240 Mg) MG tablet Take 1 tablet (400 mg total) by mouth 2 (two) times daily. 02/14/23   Azucena Fallen, MD  multivitamin (RENA-VIT) TABS tablet Take 1 tablet by mouth at bedtime. 02/14/23   Azucena Fallen, MD  polyethylene glycol (MIRALAX / GLYCOLAX) 17 g packet Take  17 g by mouth daily as needed for moderate constipation. 02/14/23   Azucena Fallen, MD  torsemide (DEMADEX) 20 MG tablet Take 4 tablets (80 mg total) by mouth daily. 05/16/23   Gaston Islam., NP      Allergies    Patient has no known allergies.    Review of Systems   Review of Systems  Physical Exam Updated Vital Signs BP (!) 185/123   Pulse 91   Temp 98.2 F (36.8 C)   Resp 20   Ht 5\' 10"  (1.778 m)   Wt 87 kg   SpO2 98%   BMI 27.52 kg/m  Physical Exam Vitals and nursing note reviewed.  Constitutional:      General: He is not in acute distress.    Appearance: He is  well-developed.  HENT:     Head: Normocephalic and atraumatic.     Comments: 3 nodular lesions noted on right sided scalp with no drainage, no surrounding erythema.  Eyes:     Conjunctiva/sclera: Conjunctivae normal.  Cardiovascular:     Rate and Rhythm: Normal rate and regular rhythm.  Pulmonary:     Effort: Pulmonary effort is normal. No respiratory distress.     Breath sounds: Normal breath sounds.  Abdominal:     General: There is distension.     Palpations: Abdomen is soft.     Tenderness: There is no abdominal tenderness.  Musculoskeletal:        General: No swelling.     Cervical back: Neck supple.     Comments: Left BKA, mild edema noted to right lower extremity  Skin:    General: Skin is warm and dry.     Capillary Refill: Capillary refill takes less than 2 seconds.  Neurological:     Mental Status: He is alert.     Motor: No weakness.  Psychiatric:        Mood and Affect: Mood normal.     ED Results / Procedures / Treatments   Labs (all labs ordered are listed, but only abnormal results are displayed) Labs Reviewed  CBC WITH DIFFERENTIAL/PLATELET - Abnormal; Notable for the following components:      Result Value   WBC 3.7 (*)    Hemoglobin 11.9 (*)    HCT 38.4 (*)    RDW 24.3 (*)    Platelets 132 (*)    All other components within normal limits  COMPREHENSIVE METABOLIC PANEL - Abnormal; Notable for the following components:   Sodium 134 (*)    Chloride 96 (*)    BUN 28 (*)    Creatinine, Ser 4.58 (*)    Albumin 3.0 (*)    Alkaline Phosphatase 237 (*)    Total Bilirubin 1.7 (*)    GFR, Estimated 15 (*)    Anion gap 16 (*)    All other components within normal limits  BRAIN NATRIURETIC PEPTIDE - Abnormal; Notable for the following components:   B Natriuretic Peptide >4,500.0 (*)    All other components within normal limits    EKG None  Radiology No results found.  Procedures Procedures    Medications Ordered in ED Medications - No data to  display  ED Course/ Medical Decision Making/ A&P                                 Medical Decision Making Amount and/or Complexity of Data Reviewed Labs: ordered.   This  patient presents to the ED for concern of possible fluid overload, this involves an extensive number of treatment options, and is a complaint that carries with it a high risk of complications and morbidity.     Co morbidities that complicate the patient evaluation  End-stage renal disease   Additional history obtained:  Additional history obtained from visitor at bedside External records from outside source obtained and reviewed including cardiology notes   Lab Tests:  I Ordered, and personally interpreted labs.  The pertinent results include: BNP greater than 4500 (patient has had consistently elevated BNP's of 2500+ for the past year including outpatient BNP of 4500+ cardiology); mildly elevated creatinine of 4.58, alkaline phosphatase 237, improved from recent   Social Determinants of Health:  Social Drivers of Health with Concerns   Tobacco Use: High Risk (09/21/2023)   Patient History    Smoking Tobacco Use: Every Day    Smokeless Tobacco Use: Former    Passive Exposure: Past  Physical Activity: Inactive (08/31/2023)   Exercise Vital Sign    Days of Exercise per Week: 0 days    Minutes of Exercise per Session: 0 min  Social Connections: Socially Isolated (08/31/2023)   Social Connection and Isolation Panel [NHANES]    Frequency of Communication with Friends and Family: More than three times a week    Frequency of Social Gatherings with Friends and Family: Once a week    Attends Religious Services: Never    Database administrator or Organizations: No    Attends Banker Meetings: Never    Marital Status: Never married  Depression (PHQ2-9): Medium Risk (08/31/2023)   Depression (PHQ2-9)    PHQ-2 Score: 6  Health Literacy: Inadequate Health Literacy (08/31/2023)   B1300 Health Literacy     Frequency of need for help with medical instructions: Always      Test / Admission - Considered:  Patient presents with chief complaint of concern about abdominal distention.  He does state this abdominal distention has improved since his dialysis session earlier in the day.  No tenderness on exam.  He states his distention also improved after going to the bathroom.  I discussed the possibility of a potential extra hemodialysis session and I also discussed the possibility of IV diuretics but patient declined both stating he felt that he could follow-up with his current dialysis center for his normal scheduled dialysis.  Based on exam this does seem reasonable.  He is not short of breath, his lungs are clear to auscultation.  He has no chest pain. Patient is also concerned about the lesions on his scalp.  He states he has been evaluated by dermatology.  At this time these do not appear to be infectious.  Plan to have patient continue to follow with dermatology for further evaluation. Patient is stable at this time for discharge home with recommendations for follow-up with dermatology and primary care team.  Patient voices understanding with the plan.         Final Clinical Impression(s) / ED Diagnoses Final diagnoses:  Elevated brain natriuretic peptide (BNP) level  Abdominal distention    Rx / DC Orders ED Discharge Orders     None         Pamala Duffel 09/21/23 0559    Mesner, Barbara Cower, MD 09/21/23 [phone]

## 2023-09-26 ENCOUNTER — Other Ambulatory Visit (HOSPITAL_COMMUNITY): Payer: Self-pay | Admitting: Nephrology

## 2023-09-26 ENCOUNTER — Encounter (HOSPITAL_COMMUNITY): Payer: Self-pay | Admitting: Nephrology

## 2023-09-26 DIAGNOSIS — R188 Other ascites: Secondary | ICD-10-CM

## 2023-10-03 ENCOUNTER — Ambulatory Visit (HOSPITAL_COMMUNITY)
Admission: RE | Admit: 2023-10-03 | Discharge: 2023-10-03 | Disposition: A | Discharge status: Home / Self Care | Source: Ambulatory Visit | Attending: Nephrology | Admitting: Nephrology

## 2023-10-03 ENCOUNTER — Inpatient Hospital Stay (HOSPITAL_COMMUNITY)
Admission: RE | Admit: 2023-10-03 | Discharge: 2023-10-03 | Disposition: A | Discharge status: Home / Self Care | Source: Ambulatory Visit | Attending: Nephrology | Admitting: Nephrology

## 2023-10-03 DIAGNOSIS — R188 Other ascites: Secondary | ICD-10-CM | POA: Insufficient documentation

## 2023-10-03 HISTORY — PX: IR PARACENTESIS: IMG2679

## 2023-10-03 LAB — BODY FLUID CELL COUNT WITH DIFFERENTIAL
Eos, Fluid: 1 %
Lymphs, Fluid: 26 %
Monocyte-Macrophage-Serous Fluid: 71 % (ref 50–90)
Neutrophil Count, Fluid: 1 % (ref 0–25)
Total Nucleated Cell Count, Fluid: 210 uL (ref 0–1000)

## 2023-10-03 LAB — ALBUMIN, PLEURAL OR PERITONEAL FLUID: Albumin, Fluid: 1.7 g/dL

## 2023-10-03 NOTE — Progress Notes (Unsigned)
 Subjective:    Patient ID: Todd Mccoy, male    DOB: [birth year], 53 y.o.   MRN: [id number]  HPI   Pain Inventory Average Pain {NUMBERS; 0-10:5044} Pain Right Now {NUMBERS; 0-10:5044} My pain is {PAIN DESCRIPTION:21022940}  In the last 24 hours, has pain interfered with the following? General activity {NUMBERS; 0-10:5044} Relation with others {NUMBERS; 0-10:5044} Enjoyment of life {NUMBERS; 0-10:5044} What TIME of day is your pain at its worst? {time of day:24191} Sleep (in general) {BHH GOOD/FAIR/POOR:22877}  Pain is worse with: {ACTIVITIES:21022942} Pain improves with: {PAIN IMPROVES MWUX:32440102} Relief from Meds: {NUMBERS; 0-10:5044}  Family History  Problem Relation Age of Onset   Diabetes Mother    Diabetes Father    Diabetes Brother    Colon cancer Neg Hx    Esophageal cancer Neg Hx    Pancreatic cancer Neg Hx    Stomach cancer Neg Hx    Liver disease Neg Hx    CAD Neg Hx    Social History   Socioeconomic History   Marital status: Single    Spouse name: Not on file   Number of children: 1   Years of education: Not on file   Highest education level: Not on file  Occupational History   Not on file  Tobacco Use   Smoking status: Every Day    Current packs/day: 0.00    Average packs/day: 0.5 packs/day for 32.0 years (16.0 ttl pk-yrs)    Types: Cigarettes    Start date: 08/24/1989    Last attempt to quit: 08/24/2021    Years since quitting: 2.1    Passive exposure: Past   Smokeless tobacco: Former   Tobacco comments:    Quit smoking previously February 2023  Vaping Use   Vaping status: Never Used  Substance and Sexual Activity   Alcohol use: No   Drug use: No   Sexual activity: Not on file  Other Topics Concern   Not on file  Social History Narrative   Not on file   Social Drivers of Health   Financial Resource Strain: Low Risk  (08/31/2023)   Overall Financial Resource Strain (CARDIA)    Difficulty of Paying Living Expenses: Not hard  at all  Food Insecurity: No Food Insecurity (08/31/2023)   Hunger Vital Sign    Worried About Running Out of Food in the Last Year: Never true    Ran Out of Food in the Last Year: Never true  Transportation Needs: No Transportation Needs (08/31/2023)   PRAPARE - Administrator, Civil Service (Medical): No    Lack of Transportation (Non-Medical): No  Physical Activity: Inactive (08/31/2023)   Exercise Vital Sign    Days of Exercise per Week: 0 days    Minutes of Exercise per Session: 0 min  Stress: No Stress Concern Present (08/31/2023)   [employer] of Occupational Health - Occupational Stress Questionnaire    Feeling of Stress : Only a little  Social Connections: Socially Isolated (08/31/2023)   Social Connection and Isolation Panel [NHANES]    Frequency of Communication with Friends and Family: More than three times a week    Frequency of Social Gatherings with Friends and Family: Once a week    Attends Religious Services: Never    Database administrator or Organizations: No    Attends Banker Meetings: Never    Marital Status: Never married   Past Surgical History:  Procedure Laterality Date   ABSCESS DRAINAGE  neck   AMPUTATION Left 11/14/2020   Procedure: LEFT 5TH RAY AMPUTATION;  Surgeon: Nadara Mustard, MD;  Location: [facility] OR;  Service: Orthopedics;  Laterality: Left;   AMPUTATION Left 12/10/2020   Procedure: LEFT BELOW KNEE AMPUTATION;  Surgeon: Nadara Mustard, MD;  Location: [facility] OR;  Service: Orthopedics;  Laterality: Left;   AMPUTATION TOE Right 03/25/2020   Procedure: AMPUTATION TOE;  Surgeon: Park Liter, DPM;  Location: [facility] ORS;  Service: Podiatry;  Laterality: Right;   AV FISTULA PLACEMENT Right 05/23/2023   Procedure: RIGHT ARM Brachiocephalic ARTERIOVENOUS (AV) FISTULA CREATION;  Surgeon: Victorino Sparrow, MD;  Location: [facility] OR;  Service: Vascular;  Laterality: Right;   INCISION AND DRAINAGE ABSCESS Right 09/07/2014   Procedure: INCISION AND  DRAINAGE ABSCESS RIGHT FLANK;  Surgeon: Avel Peace, MD;  Location: [facility] ORS;  Service: General;  Laterality: Right;   IR FLUORO GUIDE CV LINE RIGHT  02/11/2023   IR US GUIDE VASC ACCESS RIGHT  02/11/2023   LEG AMPUTATION BELOW KNEE Left 12/10/2020   SCROTUM EXPLORATION     Past Surgical History:  Procedure Laterality Date   ABSCESS DRAINAGE     neck   AMPUTATION Left 11/14/2020   Procedure: LEFT 5TH RAY AMPUTATION;  Surgeon: Nadara Mustard, MD;  Location: [facility] OR;  Service: Orthopedics;  Laterality: Left;   AMPUTATION Left 12/10/2020   Procedure: LEFT BELOW KNEE AMPUTATION;  Surgeon: Nadara Mustard, MD;  Location: [facility] OR;  Service: Orthopedics;  Laterality: Left;   AMPUTATION TOE Right 03/25/2020   Procedure: AMPUTATION TOE;  Surgeon: Park Liter, DPM;  Location: [facility];  Service: Podiatry;  Laterality: Right;   AV FISTULA PLACEMENT Right 05/23/2023   Procedure: RIGHT ARM Brachiocephalic ARTERIOVENOUS (AV) FISTULA CREATION;  Surgeon: Victorino Sparrow, MD;  Location: [facility] OR;  Service: Vascular;  Laterality: Right;   INCISION AND DRAINAGE ABSCESS Right 09/07/2014   Procedure: INCISION AND DRAINAGE ABSCESS RIGHT FLANK;  Surgeon: Avel Peace, MD;  Location: [facility] OR[facility];  Service: General;  Laterality: Right;   IR FLUORO GUIDE CV LINE RIGHT  02/11/2023   IR US GUIDE VASC ACCESS RIGHT  02/11/2023   LEG AMPUTATION BELOW KNEE Left 12/10/2020   SCROTUM EXPLORATION     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  Opioid Risk Score:   Fall Risk Score:  `1  Depression screen [facility] 2/9     08/31/2023    9:57 AM 02/24/2022    4:06 PM 09/30/2021    9:27 AM 03/19/2021   10:59 AM  02/25/2021    2:37 PM 01/21/2021    2:08 PM 10/22/2020    8:50 AM  Depression screen PHQ 2/9  Decreased Interest 1 0 0 0 0 0 0  Down, Depressed, Hopeless 1 0 0 0 0 0 0  PHQ - 2 Score 2 0 0 0 0 0 0  Altered sleeping 2 0  1 3 3  0  Tired, decreased energy 1 0  0 3 0 0  Change in appetite 1 0  0 0 0 0  Feeling bad or failure about yourself  0 0  0 0 0 0  Trouble concentrating 0 0  0 0 0 0  Moving slowly or fidgety/restless 0 0  0 0 0 0  Suicidal thoughts 0 0  0 0 0 0  PHQ-9 Score 6 0  1 6 3  0  Difficult doing work/chores Somewhat difficult          Review of Systems     Objective:   Physical Exam        Assessment & Plan:

## 2023-10-04 LAB — PATHOLOGIST SMEAR REVIEW

## 2023-10-05 ENCOUNTER — Encounter: Payer: Self-pay | Admitting: Physical Medicine and Rehabilitation

## 2023-10-05 ENCOUNTER — Encounter
Payer: Medicare Other | Attending: Physical Medicine and Rehabilitation | Admitting: Physical Medicine and Rehabilitation

## 2023-10-05 VITALS — BP 129/79 | HR 71 | Ht 70.0 in

## 2023-10-05 DIAGNOSIS — E0842 Diabetes mellitus due to underlying condition with diabetic polyneuropathy: Secondary | ICD-10-CM | POA: Diagnosis present

## 2023-10-05 DIAGNOSIS — G5603 Carpal tunnel syndrome, bilateral upper limbs: Secondary | ICD-10-CM | POA: Insufficient documentation

## 2023-10-05 DIAGNOSIS — L97511 Non-pressure chronic ulcer of other part of right foot limited to breakdown of skin: Secondary | ICD-10-CM | POA: Diagnosis present

## 2023-10-05 DIAGNOSIS — G894 Chronic pain syndrome: Secondary | ICD-10-CM | POA: Diagnosis not present

## 2023-10-05 DIAGNOSIS — G5623 Lesion of ulnar nerve, bilateral upper limbs: Secondary | ICD-10-CM

## 2023-10-05 DIAGNOSIS — R52 Pain, unspecified: Secondary | ICD-10-CM | POA: Diagnosis present

## 2023-10-05 DIAGNOSIS — G4709 Other insomnia: Secondary | ICD-10-CM

## 2023-10-05 MED ORDER — NORTRIPTYLINE HCL 25 MG PO CAPS: 50.0000 mg | ORAL_CAPSULE | Freq: Every day | ORAL | 3 refills | Status: DC

## 2023-10-05 NOTE — Patient Instructions (Addendum)
   I am starting you on nortriptyline 25 mg capsules.  Start with 1 capsule at nighttime for 1 week, then if you tolerate with no side effects, you can increase it to 2 capsules at nighttime.  Stop medication and inform the clinic if you are having any concerning side effects.  Stay on the medication for at least 6 weeks to know if it is effective for your pain or not.  Next visit we may performed a urine drug screen and signed a pain contract.  If results are as expected, and if pain is not improved, we may consider starting you on a Butrans 5 mcg/day patch next visit.  With your current renal function, other short acting opiates are risky and would avoid them at this time.  You can continue Tylenol.  Consider going back to your surgeon to evaluate if you need repeat carpal and cubital tunnel releases; we can do the EMG in our office if necessary.  I would like to try and get your pain under control with medication first before looking into surgical options.  Please obtain neutral bilateral wrist splints to use at nighttime to prevent worsening carpal tunnel.   Please follow up with your podiatrist regarding area of skin breakdown between your right toes  Follow-up with me in 2 months

## 2023-10-05 NOTE — Progress Notes (Unsigned)
 Subjective:    Patient ID: Todd Mccoy, male    DOB: 1971/01/11, 53 y.o.   MRN: [id number]  HPI HPI  Todd Mccoy is a 53 y.o. 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   They are presenting to PM&R clinic as a new patient for pain management evaluation. They were referred by Rogers Blocker, PA for treatment of bilateral hand and leg pain.   Source: Right foot > hands; occasional phantom pain in his LLE  Inciting incident: none   Description of pain: constant and burning  Exacerbating factors: none; generally worse at nighttime but not much Remitting factors: pain medication, baths/soaking help while he is in it.  Red flag symptoms: No red flags for back pain endorsed in Hx or ROS  Medications tried: Topical medications (mild effect) : Uses "valspro" sometime  Nsaids ( can't due to CKD ) :  Tylenol  (mild effect) : Takes 4 extra strength tylenol in a day, spread out Opiates  (moderate effect) : Has had after surgery - no side effects.  Gabapentin / Lyrica  (mild effect) : Lyrica 50 mg BID - stopped this when he started gabapentin 600 mg twice a day - has not noticed a benefit from either.  TCAs  (never tried) :  SNRIs  (never tried) :  Other  (never tried) :   Other treatments: PT/OT  (no effect) : PT for "trying to walk" before he got in LLE prosthetic.  Accupuncture/chiropractor/massage  (never tried) :  TENs unit (never tried) :  Injections (never tried) :  Surgery  (unsure of effect) : He had carpal and cubital tunnel releases in 2019; pain went away but came back after that.  LLE BKA.  Other  () : Cannot use LLE prosthetic due to swelling in his leg.   Goals for pain control: "Walk around and get myself together". He is independent for ADLs. He is WC dependent and Mod I for short distances. 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 He lives with his nephew, son, and daughter; they help him drive and get around.   Tobacco use: occassionally now, has smoked for 10+ years. Alcohol use: none Drug use: none  Prior UDS results:     Component Value Date/Time   LABOPIA NONE DETECTED 02/27/2021 0813   COCAINSCRNUR NONE DETECTED 02/27/2021 0813   LABBENZ NONE DETECTED 02/27/2021 0813   AMPHETMU NONE DETECTED 02/27/2021 0813   THCU NONE DETECTED 02/27/2021 0813   LABBARB NONE DETECTED 02/27/2021 0813      Pain Inventory Average Pain 8 Pain Right Now 7 My pain is constant and tingling  In the last 24 hours, has pain interfered with the following? General activity 5 Relation with others 5 Enjoyment of life 6 What TIME of day is your pain at its worst? evening Sleep (in general) Poor  Pain is worse with: some activites and position  Pain improves with: rest and medication Relief from Meds: 4  do you drive?  no use a wheelchair transfers alone Do you have any goals in this area?  yes  disabled:  date disabled 2022 I need assistance with the following:  meal prep, household duties, and shopping Do you have any goals in this area?  yes  numbness tingling trouble walking spasms dizziness anxiety  Any changes since last visit?  no  Any changes since last visit?  no    Family History  Problem Relation Age of Onset   Diabetes Mother    Diabetes Father    Diabetes Brother    Colon cancer Neg Hx    Esophageal cancer Neg Hx    Pancreatic cancer Neg Hx    Stomach cancer Neg Hx    Liver disease Neg Hx    CAD Neg  Hx    Social History   Socioeconomic History   Marital status: Single    Spouse name: Not on file   Number of children: 1   Years of education: Not on file   Highest education level: Not on file  Occupational History   Not on file  Tobacco Use   Smoking status: Every Day    Current packs/day: 0.00    Average packs/day: 0.5 packs/day for 32.0 years (16.0 ttl pk-yrs)    Types: Cigarettes    Start date: 08/24/1989    Last attempt to quit: 08/24/2021    Years since quitting: 2.1    Passive exposure: Past   Smokeless tobacco: Former   Tobacco comments:    Quit smoking previously February 2023  Vaping Use   Vaping status: Never Used  Substance and Sexual Activity   Alcohol use: No   Drug use: No   Sexual activity: Not on file  Other Topics Concern   Not on file  Social History Narrative   Not on file   Social Drivers of Health   Financial Resource Strain: Low Risk  (08/31/2023)   Overall Financial Resource Strain (CARDIA)    Difficulty of Paying Living Expenses: Not hard at all  Food Insecurity: No Food Insecurity (08/31/2023)   Hunger Vital Sign    Worried About Running Out of Food in the Last Year: Never true    Ran Out of Food in the Last Year: Never true  Transportation Needs: No Transportation Needs (08/31/2023)   PRAPARE - Administrator, Civil Service (Medical): No    Lack of Transportation (Non-Medical): No  Physical Activity: Inactive (08/31/2023)   Exercise Vital Sign    Days of Exercise per Week: 0 days    Minutes of Exercise per Session: 0 min  Stress: No Stress Concern Present (08/31/2023)   [employer] of Occupational Health - Occupational Stress Questionnaire    Feeling of Stress : Only a little  Social Connections: Socially Isolated (08/31/2023)   Social Connection and Isolation Panel [NHANES]    Frequency of Communication with Friends and Family: More than three times a week    Frequency of Social Gatherings with Friends and Family: Once a  week    Attends Religious Services: Never    Database administrator or Organizations: No    Attends Banker Meetings: Never    Marital Status: Never married   Past Surgical History:  Procedure Laterality Date   ABSCESS DRAINAGE     neck   AMPUTATION Left 11/14/2020   Procedure: LEFT 5TH RAY AMPUTATION;  Surgeon: Nadara Mustard, MD;  Location: [facility] OR;  Service: Orthopedics;  Laterality: Left;   AMPUTATION Left 12/10/2020   Procedure: LEFT BELOW KNEE AMPUTATION;  Surgeon: Nadara Mustard, MD;  Location: [facility]  OR;  Service: Orthopedics;  Laterality: Left;   AMPUTATION TOE Right 03/25/2020   Procedure: AMPUTATION TOE;  Surgeon: Park Liter, DPM;  Location: [facility] OR[facility];  Service: Podiatry;  Laterality: Right;   AV FISTULA PLACEMENT Right 05/23/2023   Procedure: RIGHT ARM Brachiocephalic ARTERIOVENOUS (AV) FISTULA CREATION;  Surgeon: Victorino Sparrow, MD;  Location: [facility] OR;  Service: Vascular;  Laterality: Right;   INCISION AND DRAINAGE ABSCESS Right 09/07/2014   Procedure: INCISION AND DRAINAGE ABSCESS RIGHT FLANK;  Surgeon: Avel Peace, MD;  Location: [facility] OR[facility];  Service: General;  Laterality: Right;   IR FLUORO GUIDE CV LINE RIGHT  02/11/2023   IR PARACENTESIS  10/03/2023   IR US GUIDE VASC ACCESS RIGHT  02/11/2023   LEG AMPUTATION BELOW KNEE Left 12/10/2020   SCROTUM EXPLORATION     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  Opioid Risk Score:   Fall Risk Score:  `1  Depression screen PHQ 2/9     10/05/2023    1:08 PM 08/31/2023    9:57 AM 02/24/2022    4:06 PM 09/30/2021    9:27 AM 03/19/2021   10:59 AM 02/25/2021    2:37 PM 01/21/2021    2:08 PM  Depression screen PHQ 2/9  Decreased Interest 0 1 0 0 0 0 0  Down, Depressed, Hopeless 0 1 0 0 0 0 0  PHQ - 2 Score 0 2 0 0 0 0 0  Altered sleeping 0 2 0  1 3 3   Tired, decreased energy 0 1 0  0 3 0  Change in appetite 0 1 0  0 0 0  Feeling bad or failure about yourself  0 0 0  0 0 0  Trouble concentrating 0 0 0  0 0 0  Moving slowly or fidgety/restless 0 0 0  0 0 0  Suicidal thoughts 0 0 0  0 0 0  PHQ-9 Score 0 6 0  1 6 3   Difficult doing work/chores  Somewhat difficult         Review of Systems     Objective:   Physical Exam Constitutional: No apparent distress. Appears older than stated age.   HENT: No JVD. Neck Supple. Trachea midline. Atraumatic, normocephalic. Eyes: blind Cardiovascular:  2+ RLE edema - wrapped in ACE.    Respiratory: CTAB. No rales, rhonchi, or wheezing. On RA.  Abdomen: + bowel sounds, normoactive. No distention or tenderness.  Skin: ulceration of R inner 1-2nd toe webspace - slough overlying, no expressable drainage  MSK: + LLE BKA -  covered in shrinker sock Loss of muscle bulk in bilateral thenar and hypothenar eminences       Neurologic exam:  Cognition: AAO to person, place, time and event.  Language: Fluent, No substitutions or neoglisms.  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 No ataxia   Spasticity: MAS 0 in all extremities.        Strength:                RUE: 4/5 SA, 4/5 EF, 4/5 EE, 4/5 WE, 4/5 FF, 4/5 FA                LUE:  4/5 SA, 4/5 EF, 4/5 EE, 4/5 WE, 4/5 FF, 4/5 FA                RLE: 3/5 HF, 4/5 KE, 1/5  DF, 1/5  EHL, 1/5  PF                 LLE:  3/5 HF, 4/5 KE,        Assessment & Plan:   JKAI ARWOOD is a 53 y.o. 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   They are presenting to PM&R clinic as a new patient for treatment  of neuropathic pain of the right foot and bilateral hands, along with LLE phantom pain s/p BKA.  Chronic pain syndrome Encounter for pain management  Next visit we may performed a urine drug screen and signed a pain contract.  If results are as expected, and if pain is not improved, we may consider starting you on a Butrans 5 mcg/day patch next visit.  With your current renal function, other short acting opiates are risky and would avoid them at this time.  You can continue Tylenol.  Diabetic polyneuropathy associated with diabetes mellitus due to underlying condition (HCC)  I am starting you on nortriptyline 25 mg capsules.  Start with 1 capsule at nighttime for 1 week, then if you tolerate with no side effects, you can increase it to 2 capsules at nighttime.  Stop medication and inform the clinic if you are having any concerning side effects.  Stay on the medication for at least 6 weeks to know if it is effective for your pain or not.  Gabapentin currently prescribed by PCP at 600 mg  BID; Maximum recommended dosing for ESRD on HD is 300 mg daily with 300 mg supplemental dose 4 hours after dialysis. Patient wishes to continue current dosing and schedule - can continue to fill through his PCP. He has failed lyrica.   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  I would like to try and get your pain under control with medication first before looking into surgical options.  Please obtain neutral bilateral wrist splints to use at nighttime to prevent worsening carpal tunnel.  Skin ulcer of  right great toe, limited to breakdown of skin Please follow up with your podiatrist regarding area of skin breakdown between your right toes   Other insomnia  Due to pain; see medication adjustments above  Pick the same time to lay down every night, ideally between 8 and 10 PM.    Starting 1 hour before you want to go to sleep, turn off all television screens, phone screens, tablets, and computers.    Keep the lights low and perform only low stimulation activities, such as reading.    Only use your bedroom for sleep and sex. Avoid daytime naps and limit any time spent in your bed that is not dedicated to sleep.   You may also take 3 to 5 mg of over-the-counter melatonin approximately 1 hour before bedtime, or if you are prescribed a medication for sleep take it at this time.  Avoid alcohol, decongestants/pseudoephedrine, antihistamines, caffeine, and nicotine a few hours before bedtime, as these can reduce your quality of sleep.   Other orders -     Nortriptyline HCl; Take 2 capsules (50 mg total) by mouth at bedtime. Start with 1 capsule at nighttime for 1 week, then if no side effects increase to 2 capsules at nighttime  Dispense: 60 capsule; Refill: 3

## 2023-10-06 LAB — BODY FLUID CULTURE W GRAM STAIN: Gram Stain: NONE SEEN

## 2023-10-07 ENCOUNTER — Emergency Department (HOSPITAL_COMMUNITY)
Admission: EM | Admit: 2023-10-07 | Discharge: 2023-10-07 | Disposition: A | Discharge status: Home / Self Care | Attending: Emergency Medicine | Admitting: Emergency Medicine

## 2023-10-07 ENCOUNTER — Emergency Department (HOSPITAL_COMMUNITY)

## 2023-10-07 ENCOUNTER — Encounter (HOSPITAL_COMMUNITY): Payer: Self-pay

## 2023-10-07 DIAGNOSIS — L97511 Non-pressure chronic ulcer of other part of right foot limited to breakdown of skin: Secondary | ICD-10-CM | POA: Insufficient documentation

## 2023-10-07 DIAGNOSIS — Z794 Long term (current) use of insulin: Secondary | ICD-10-CM | POA: Diagnosis not present

## 2023-10-07 DIAGNOSIS — D631 Anemia in chronic kidney disease: Secondary | ICD-10-CM | POA: Insufficient documentation

## 2023-10-07 DIAGNOSIS — E11622 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 ulcer: Secondary | ICD-10-CM | POA: Diagnosis not present

## 2023-10-07 DIAGNOSIS — R600 Localized edema: Secondary | ICD-10-CM | POA: Diagnosis present

## 2023-10-07 DIAGNOSIS — N186 End stage renal disease: Secondary | ICD-10-CM | POA: Diagnosis not present

## 2023-10-07 DIAGNOSIS — Z89511 Acquired absence of right leg below knee: Secondary | ICD-10-CM | POA: Diagnosis not present

## 2023-10-07 DIAGNOSIS — E1122 Type 2 diabetes mellitus with diabetic chronic kidney disease: Secondary | ICD-10-CM | POA: Insufficient documentation

## 2023-10-07 DIAGNOSIS — L97111 Non-pressure chronic ulcer of right thigh limited to breakdown of skin: Secondary | ICD-10-CM | POA: Insufficient documentation

## 2023-10-07 LAB — CBC
HCT: 31.9 % — ABNORMAL LOW (ref 39.0–52.0)
Hemoglobin: 9.9 g/dL — ABNORMAL LOW (ref 13.0–17.0)
MCH: 29.1 pg (ref 26.0–34.0)
MCHC: 31 g/dL (ref 30.0–36.0)
MCV: 93.8 fL (ref 80.0–100.0)
Platelets: 100 10*3/uL — ABNORMAL LOW (ref 150–400)
RBC: 3.4 MIL/uL — ABNORMAL LOW (ref 4.22–5.81)
RDW: 23.2 % — ABNORMAL HIGH (ref 11.5–15.5)
WBC: 8.9 10*3/uL (ref 4.0–10.5)
nRBC: 0 % (ref 0.0–0.2)

## 2023-10-07 LAB — BASIC METABOLIC PANEL WITH GFR
Anion gap: 12 (ref 5–15)
BUN: 33 mg/dL — ABNORMAL HIGH (ref 6–20)
CO2: 26 mmol/L (ref 22–32)
Calcium: 7.8 mg/dL — ABNORMAL LOW (ref 8.9–10.3)
Chloride: 94 mmol/L — ABNORMAL LOW (ref 98–111)
Creatinine, Ser: 4.11 mg/dL — ABNORMAL HIGH (ref 0.61–1.24)
GFR, Estimated: 17 mL/min — ABNORMAL LOW (ref >60)
Glucose, Bld: 190 mg/dL — ABNORMAL HIGH (ref 70–99)
Potassium: 3.5 mmol/L (ref 3.5–5.1)
Sodium: 132 mmol/L — ABNORMAL LOW (ref 135–145)

## 2023-10-07 MED ORDER — DOXYCYCLINE HYCLATE 100 MG PO TABS
100.0000 mg | ORAL_TABLET | Freq: Once | ORAL | Status: AC
  Administered 2023-10-07: 100 mg via ORAL
  Filled 2023-10-07: qty 1

## 2023-10-07 MED ORDER — DOXYCYCLINE HYCLATE 100 MG PO CAPS: 100.0000 mg | ORAL_CAPSULE | Freq: Two times a day (BID) | ORAL | 0 refills | Status: DC

## 2023-10-07 NOTE — ED Triage Notes (Signed)
 Pt states that he has an ulcer on his R foot on the pinky toe that he needs checked, pt is diabetic.

## 2023-10-07 NOTE — ED Notes (Signed)
 Wound cleaned and dressed on right foot. Wound care supplies sent with patient.

## 2023-10-07 NOTE — ED Provider Notes (Signed)
 [place] EMERGENCY DEPARTMENT AT [facility] Provider Note   CSN: [id number] Arrival date & time: 10/07/23  0048     History  Chief Complaint  Patient presents with   Wound Infection    Todd Mccoy is a 53 y.o. male.  The history is provided by the patient and medical records.  Todd Mccoy is a 53 y.o. male who presents to the Emergency Department complaining of foot wound.  He presents to the emergency department for evaluation of wound to his right foot.  He has a history of ESRD and dialyzes Tuesday, Thursday, Saturday as well as diabetes.  He states that he went to his pain management doctor yesterday and he was told he had a wound on his foot.  He was unaware of this.  He does not have any pain in the area.  No fevers, chest pain, difficulty breathing.  There has been a small amount of drainage from the area.  He has been experiencing fluctuating issues with lower extremity edema and does have some swelling in his legs currently.  He is compliant with his dialysis.     Home Medications Prior to Admission medications   Medication Sig Start Date End Date Taking? Authorizing Provider  doxycycline (VIBRAMYCIN) 100 MG capsule Take 1 capsule (100 mg total) by mouth 2 (two) times daily. 10/07/23  Yes Tilden Fossa, MD  acetaminophen (TYLENOL) 325 MG tablet Take 2 tablets (650 mg total) by mouth every 6 (six) hours as needed for mild pain. Patient taking differently: Take 1,000 mg by mouth in the morning and at bedtime. 02/14/23   Azucena Fallen, MD  amLODipine (NORVASC) 10 MG tablet Take 1 tablet (10 mg total) by mouth daily. 06/20/23   [name], MD  atorvastatin (LIPITOR) 20 MG tablet TAKE 1 TABLET (20 MG TOTAL) BY MOUTH DAILY. 05/16/23   Gaston Islam., NP  calcitRIOL (ROCALTROL) 0.25 MCG capsule Take 1 capsule (0.25 mcg total) by mouth 2 (two) times daily. 07/28/23   Quintella Reichert, MD  carvedilol (COREG) 25 MG tablet TAKE 1 TABLET BY MOUTH  TWICE DAILY WITH A MEAL . APPOINTMENT REQUIRED FOR FUTURE REFILLS 09/07/23   Quintella Reichert, MD  docusate sodium (COLACE) 100 MG capsule Take 1 capsule (100 mg total) by mouth 2 (two) times daily as needed for mild constipation. Patient not taking: Reported on 08/31/2023 02/14/23   Azucena Fallen, MD  ferrous sulfate 325 (65 FE) MG EC tablet Take 1 tablet (325 mg total) by mouth daily with breakfast. 08/31/23   Anders Simmonds, PA-C  furosemide (LASIX) 40 MG tablet Take 1 tablet (40 mg total) by mouth 2 (two) times daily. 08/31/23   Anders Simmonds, PA-C  gabapentin (NEURONTIN) 300 MG capsule Take 2 capsules (600 mg total) by mouth 2 (two) times daily. 08/31/23   Anders Simmonds, PA-C  hydrALAZINE (APRESOLINE) 50 MG tablet Take 50 mg by mouth 3 (three) times daily. 02/19/23   [provider]  hydrOXYzine (ATARAX) 50 MG tablet Take 1 tablet (50 mg total) by mouth 3 (three) times daily as needed for anxiety or itching. 08/31/23   Anders Simmonds, PA-C  insulin glargine (LANTUS) 100 UNIT/ML injection Inject 0.05 mLs (5 Units total) into the skin at bedtime. 08/31/23   Anders Simmonds, PA-C  isosorbide mononitrate (IMDUR) 60 MG 24 hr tablet Take 1 tablet (60 mg total) by mouth daily. 08/31/23   Anders Simmonds, PA-C  magnesium  oxide (MAG-OX) 400 (240 Mg) MG tablet Take 1 tablet (400 mg total) by mouth 2 (two) times daily. 02/14/23   Azucena Fallen, MD  multivitamin (RENA-VIT) TABS tablet Take 1 tablet by mouth at bedtime. 02/14/23   Azucena Fallen, MD  nortriptyline (PAMELOR) 25 MG capsule Take 2 capsules (50 mg total) by mouth at bedtime. Start with 1 capsule at nighttime for 1 week, then if no side effects increase to 2 capsules at nighttime 10/05/23 02/02/24  Elijah Birk C, DO  polyethylene glycol (MIRALAX / GLYCOLAX) 17 g packet Take 17 g by mouth daily as needed for moderate constipation. 02/14/23   Azucena Fallen, MD  torsemide (DEMADEX) 20 MG tablet Take 4 tablets (80  mg total) by mouth daily. 05/16/23   Gaston Islam., NP      Allergies    Patient has no known allergies.    Review of Systems   Review of Systems  All other systems reviewed and are negative.   Physical Exam Updated Vital Signs BP (!) 141/75 (BP Location: Left Arm)   Pulse 79   Temp 98.3 F (36.8 C) (Oral)   Resp 19   SpO2 96%  Physical Exam Vitals and nursing note reviewed.  Constitutional:      Appearance: He is well-developed.  HENT:     Head: Normocephalic and atraumatic.  Cardiovascular:     Rate and Rhythm: Normal rate and regular rhythm.  Pulmonary:     Effort: Pulmonary effort is normal. No respiratory distress.  Musculoskeletal:     Comments: Left lower extremity BKA.  Right lower extremity with 2+ pitting edema.  2+ right pedal pulse.  There is a punctate area of drainage at the lateral base of the fifth digit.  No significant surrounding erythema.  Drainage is clear.  No local tenderness.  Skin:    General: Skin is warm and dry.  Neurological:     Mental Status: He is alert and oriented to person, place, and time.  Psychiatric:        Behavior: Behavior normal.     ED Results / Procedures / Treatments   Labs (all labs ordered are listed, but only abnormal results are displayed) Labs Reviewed  CBC - Abnormal; Notable for the following components:      Result Value   RBC 3.40 (*)    Hemoglobin 9.9 (*)    HCT 31.9 (*)    RDW 23.2 (*)    Platelets 100 (*)    All other components within normal limits  BASIC METABOLIC PANEL WITH GFR - Abnormal; Notable for the following components:   Sodium 132 (*)    Chloride 94 (*)    Glucose, Bld 190 (*)    BUN 33 (*)    Creatinine, Ser 4.11 (*)    Calcium 7.8 (*)    GFR, Estimated 17 (*)    All other components within normal limits    EKG None  Radiology CT Foot Right Wo Contrast Result Date: 10/07/2023 CLINICAL DATA:  Osteomyelitis suspected, ulcer on right foot on the pinky toe. Patient is diabetic.  EXAM: CT OF THE RIGHT FOOT WITHOUT CONTRAST TECHNIQUE: Multidetector CT imaging of the right foot was performed according to the standard protocol. Multiplanar CT image reconstructions were also generated. RADIATION DOSE REDUCTION: This exam was performed according to the departmental dose-optimization program which includes automated exposure control, adjustment of the mA and/or kV according to patient size and/or use of iterative reconstruction technique. COMPARISON:  Radiograph 10/07/2023 FINDINGS: Bones/Joint/Cartilage Mottled appearance of the midfoot and hindfoot is favored due to demineralization. Chronic osteomyelitis considered less likely but not excluded. No acute fracture or dislocation. Amputation of the second toe at the MTP joint. No evidence of osteomyelitis. Ligaments Suboptimally assessed by CT. Muscles and Tendons Grossly intact.  Diffuse atrophy. Soft tissues Diffuse soft tissue swelling about the foot. Soft tissue ulcer overlying the dorsum of the fifth toe with small 1.3 x 0.6 cm fluid and gas collection. IMPRESSION: 1. Soft tissue ulcer overlying the dorsum of the fifth toe with small 1.3 x 0.6 cm fluid and gas collection. 2. Diffuse soft tissue swelling about the foot. Mottled appearance of the midfoot and hindfoot is favored due to demineralization. Chronic osteomyelitis considered less likely but not excluded. Electronically Signed   By: Minerva Fester M.D.   On: 10/07/2023 03:46   DG Foot Complete Right Result Date: 10/07/2023 CLINICAL DATA:  Foot wound, ulcer to bottom of little toe. Diabetic. EXAM: RIGHT FOOT COMPLETE - 3+ VIEW COMPARISON:  03/24/2020 FINDINGS: Prior amputation of the right 2nd toe. No bone destruction to suggest acute osteomyelitis. Mottled appearance with sclerosis throughout the hindfoot and distal tibia. Diffuse soft tissue swelling. No soft tissue gas or radiopaque foreign body. IMPRESSION: Mottled appearance with sclerosis in the distal tibia, hindfoot and  midfoot, new since prior study. This is of unknown etiology. Given the rather diffuse nature, chronic osteomyelitis felt unlikely but cannot be excluded. This could be further evaluated with MRI or CT if felt clinically indicated. No acute bony abnormality. No bone destruction to suggest acute osteomyelitis. Electronically Signed   By: Charlett Nose M.D.   On: 10/07/2023 01:25    Procedures Procedures    Medications Ordered in ED Medications  doxycycline (VIBRA-TABS) tablet 100 mg (has no administration in time range)    ED Course/ Medical Decision Making/ A&P                                 Medical Decision Making Amount and/or Complexity of Data Reviewed Labs: ordered. Radiology: ordered.  Risk Prescription drug management.   Patient with history of diabetes, CKD on hemodialysis here for evaluation of fifth toe wound.  He does have spontaneous serous drainage from this area.  There is local soft tissue swelling but no erythema.  No evidence of sepsis.  Labs with stable renal insufficiency, stable anemia.  Plain film with possible osteomyelitis.  CT scan demonstrates fluid collection, no definite osteo-.  This collection is spontaneously draining, do not feel I&D would be of benefit at this time.  No evidence of necrotizing soft tissue infection.  Will start on antibiotics.  He has been seen by wound care before, will have him follow-up closely with the wound center with outpatient follow-up and return precautions.        Final Clinical Impression(s) / ED Diagnoses Final diagnoses:  Diabetic ulcer of toe of right foot associated with diabetes mellitus due to underlying condition, limited to breakdown of skin (HCC)    Rx / DC Orders ED Discharge Orders          Ordered    doxycycline (VIBRAMYCIN) 100 MG capsule  2 times daily        10/07/23 0358              Tilden Fossa, MD 10/07/23 [phone]

## 2023-10-07 NOTE — ED Notes (Signed)
 Upon discharge, patient insistent that he would call family or an uber from ER waiting room for a ride home. Family member with patient at this time. Writer assisted patient to lobby. Pt in NAD upon departure. Pt A&Ox4, awake and alert. Writer told patient to let staff know if he had difficulty contacting family or uber so that we could assist in obtaining safe ride home.

## 2023-10-11 ENCOUNTER — Other Ambulatory Visit (HOSPITAL_COMMUNITY): Payer: Self-pay

## 2023-10-12 ENCOUNTER — Encounter: Payer: Self-pay | Admitting: Gastroenterology

## 2023-10-14 ENCOUNTER — Other Ambulatory Visit (HOSPITAL_COMMUNITY): Payer: Self-pay | Admitting: Nephrology

## 2023-10-14 DIAGNOSIS — Z87898 Personal history of other specified conditions: Secondary | ICD-10-CM

## 2023-10-16 ENCOUNTER — Ambulatory Visit (INDEPENDENT_AMBULATORY_CARE_PROVIDER_SITE_OTHER)

## 2023-10-16 ENCOUNTER — Ambulatory Visit (HOSPITAL_COMMUNITY)
Admission: EM | Admit: 2023-10-16 | Discharge: 2023-10-16 | Disposition: A | Discharge status: Home / Self Care | Attending: Internal Medicine | Admitting: Internal Medicine

## 2023-10-16 ENCOUNTER — Encounter (HOSPITAL_COMMUNITY): Payer: Self-pay

## 2023-10-16 DIAGNOSIS — R0781 Pleurodynia: Secondary | ICD-10-CM | POA: Diagnosis not present

## 2023-10-16 MED ORDER — OXYCODONE-ACETAMINOPHEN 5-325 MG PO TABS: 1.0000 | ORAL_TABLET | Freq: Four times a day (QID) | ORAL | 0 refills | Status: DC | PRN

## 2023-10-16 NOTE — Discharge Instructions (Addendum)
 X-ray done today and final evaluation by the radiologist is still pending but there does appear to be a rib fracture that is minimally displaced.  At this time no further intervention is needed however precaution is needed for this area.  Avoid taking contact to this area.  If you fall on the side then recommend going to the hospital for further evaluation.  The biggest risk with rib fractures is not being able to take big breaths.  Will give a incentive spirometer to use regularly at home to help encourage deep breaths.  Recommend contacting your primary care physician to let them know that you have a rib fracture and see if they recommend following up with them.  May return to urgent care if needed.  Prescription called in for oxycodone/acetaminophen 5 mg 1 every 6 hours as needed for severe pain.

## 2023-10-16 NOTE — ED Provider Notes (Signed)
 [facility]    CSN: [id number] Arrival date & time: 10/16/23  1518      History   Chief Complaint Chief Complaint  Patient presents with   Fall   Chest Pain    Ribs    HPI Todd Mccoy is a 53 y.o. male.   53 year old male who presents urgent care with complaints of right flank pain after falling out of bed this morning.  He reports that he hit the ground on his right side and is now having pain along the right side close to his pectoralis region.  He does have a dialysis catheter on the side.  He denies any new shortness of breath.  He denies any other injury.  He is having severe pain.   Fall Associated symptoms include chest pain and shortness of breath.  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/2020   pt states recent MD visit /left eye worse than right   Dyslipidemia 12/10/2020   Hx of BKA, left (HCC)    Hypertension    Morbid obesity (HCC)    Osteomyelitis (HCC)    Pneumonia    Pulmonary HTN (HCC)    mild to moderate (PASP70mmHg) by echo 07/2023    Patient Active Problem List   Diagnosis Date Noted   Skin ulcer of right great toe, limited to breakdown of skin (HCC) 10/07/2023   Diabetic polyneuropathy associated with diabetes mellitus due to underlying condition (HCC) 10/05/2023   Other insomnia 10/05/2023    Bilateral carpal tunnel syndrome 10/05/2023   Cubital tunnel syndrome, bilateral 10/05/2023   Chronic pain syndrome 10/05/2023   Encounter for pain management 10/05/2023   Pulmonary HTN (HCC)    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/24/2023   End stage renal disease (HCC) 02/19/2023   Acquired absence of left leg below knee (HCC) 02/14/2023   Acquired absence of other right toe(s) (HCC) 02/14/2023   Dependence on renal dialysis (HCC) 02/14/2023   Hyperlipidemia, unspecified 02/14/2023   Hypertensive heart and chronic kidney disease with heart failure and with stage 5 chronic kidney disease, or end stage renal disease (HCC) 02/14/2023   Nicotine dependence, cigarettes, uncomplicated 02/14/2023   Type 2 diabetes mellitus with diabetic neuropathy, unspecified (HCC) 02/14/2023   Type 2 diabetes mellitus with diabetic peripheral angiopathy without gangrene (HCC) 02/14/2023   Type 2 diabetes mellitus with unspecified diabetic retinopathy without macular edema (HCC) 02/14/2023   Diarrhea of presumed infectious origin 02/08/2023   Metabolic acidosis 02/08/2023   Anemia of chronic disease 02/08/2023   Acute renal failure superimposed on chronic kidney disease (HCC) 02/08/2023   Encephalopathy acute 02/07/2023   Shock circulatory (HCC) 02/05/2023   PAD (peripheral artery disease) (HCC) 10/27/2022   Renal insufficiency    Acute on chronic systolic CHF (congestive heart  failure) (HCC) 01/23/2022   Prolonged QT interval 01/23/2022   Chest pain 01/23/2022   Elevated troponin 01/23/2022   Anemia due to chronic kidney disease 01/23/2022   Hypertensive emergency 01/23/2022   Hyponatremia 01/23/2022   Chronic combined systolic (congestive) and diastolic (congestive) heart failure (HCC) 04/07/2021   Chronic renal disease, stage 4, severely decreased glomerular filtration rate between 15-29 mL/min/1.73 square meter (HCC)    Hypertensive urgency    Acute kidney injury superimposed on  chronic kidney disease (HCC)    Acute CHF (congestive heart failure) (HCC) 02/27/2021   Normocytic anemia 02/27/2021   Anasarca 02/27/2021   Abscess of left foot 12/10/2020   S/P BKA (below knee amputation) unilateral, left (HCC) 12/10/2020   Dyslipidemia 12/10/2020   CKD (chronic kidney disease), stage III (HCC) 12/10/2020   HTN (hypertension) 11/12/2020   Cellulitis and abscess of foot    Diabetes mellitus with hyperglycemia (HCC) 09/07/2014   Tobacco abuse 09/07/2014   Abscess of back    Abscess of lower back 09/06/2014   DM2 (diabetes mellitus, type 2) (HCC) 09/06/2014   Sepsis (HCC) 09/06/2014   Scrotal abscess 06/20/2014    Past Surgical History:  Procedure Laterality Date   ABSCESS DRAINAGE     neck   AMPUTATION Left 11/14/2020   Procedure: LEFT 5TH RAY AMPUTATION;  Surgeon: Timothy Ford, MD;  Location: [facility] OR;  Service: Orthopedics;  Laterality: Left;   AMPUTATION Left 12/10/2020   Procedure: LEFT BELOW KNEE AMPUTATION;  Surgeon: Timothy Ford, MD;  Location: [facility] OR;  Service: Orthopedics;  Laterality: Left;   AMPUTATION TOE Right 03/25/2020   Procedure: AMPUTATION TOE;  Surgeon: Camilo Cella, DPM;  Location: [facility] ORS;  Service: Podiatry;  Laterality: Right;   AV FISTULA PLACEMENT Right 05/23/2023   Procedure: RIGHT ARM Brachiocephalic ARTERIOVENOUS (AV) FISTULA CREATION;  Surgeon: Kayla Part, MD;  Location: [facility] OR;  Service: Vascular;  Laterality: Right;   INCISION AND DRAINAGE ABSCESS Right 09/07/2014   Procedure: INCISION AND DRAINAGE ABSCESS RIGHT FLANK;  Surgeon: Adalberto Hollow, MD;  Location: [facility] ORS;  Service: General;  Laterality: Right;   IR FLUORO GUIDE CV LINE RIGHT  02/11/2023   IR PARACENTESIS  10/03/2023   IR US  GUIDE VASC ACCESS RIGHT  02/11/2023   LEG AMPUTATION BELOW KNEE Left 12/10/2020   SCROTUM EXPLORATION         Home Medications    Prior to Admission medications   Medication Sig Start Date End Date Taking? Authorizing Provider  amLODipine  (NORVASC) 10 MG tablet Take 1 tablet (10 mg total) by mouth daily. 06/20/23  Yes [name], [name], MD  atorvastatin (LIPITOR) 20 MG tablet TAKE 1 TABLET (20 MG TOTAL) BY MOUTH DAILY. 05/16/23  Yes Gerald Kitty., NP  calcitRIOL (ROCALTROL) 0.25 MCG capsule Take 1 capsule (0.25 mcg total) by mouth 2 (two) times daily. 07/28/23  Yes Turner, Rufus Council, MD  carvedilol (COREG) 25 MG tablet TAKE 1 TABLET BY MOUTH TWICE DAILY WITH A MEAL . APPOINTMENT REQUIRED FOR FUTURE REFILLS 09/07/23  Yes Turner, Rufus Council, MD  docusate sodium (COLACE) 100 MG capsule Take 1 capsule (100 mg total) by mouth 2 (two) times daily as needed for mild constipation. 02/14/23  Yes Haydee Lipa, MD  doxycycline (VIBRAMYCIN) 100 MG capsule Take 1 capsule (100 mg total) by mouth 2 (two) times daily. 10/07/23  Yes Kelsey Patricia, MD  ferrous sulfate 325 (65 FE) MG EC tablet Take 1 tablet (325 mg total) by mouth daily  with breakfast. 08/31/23  Yes McClung, Angela M, PA-C  furosemide (LASIX) 40 MG tablet Take 1 tablet (40 mg total) by mouth 2 (two) times daily. 08/31/23  Yes Hassie Lint, PA-C  gabapentin (NEURONTIN) 300 MG capsule Take 2 capsules (600 mg total) by mouth 2 (two) times daily. 08/31/23  Yes Dulce Gibbs M, PA-C  hydrALAZINE (APRESOLINE) 50 MG tablet Take 50 mg by mouth 3 (three) times daily. 02/19/23  Yes [provider]  hydrOXYzine (ATARAX) 50 MG tablet Take 1 tablet (50 mg total) by mouth 3 (three) times daily as needed for anxiety or itching. 08/31/23  Yes Dulce Gibbs M, PA-C  insulin glargine (LANTUS) 100 UNIT/ML injection Inject 0.05 mLs (5 Units total) into the skin at bedtime. 08/31/23  Yes Dulce Gibbs M, PA-C  isosorbide mononitrate (IMDUR) 60 MG 24 hr tablet Take 1 tablet (60 mg total) by mouth daily. 08/31/23  Yes Hassie Lint, PA-C  magnesium oxide (MAG-OX) 400 (240 Mg) MG tablet Take 1 tablet (400 mg total) by mouth 2 (two) times daily. 02/14/23  Yes Haydee Lipa, MD   multivitamin (RENA-VIT) TABS tablet Take 1 tablet by mouth at bedtime. 02/14/23  Yes Haydee Lipa, MD  nortriptyline (PAMELOR) 25 MG capsule Take 2 capsules (50 mg total) by mouth at bedtime. Start with 1 capsule at nighttime for 1 week, then if no side effects increase to 2 capsules at nighttime 10/05/23 02/02/24 Yes Engler, Britta Candy C, DO  polyethylene glycol (MIRALAX / GLYCOLAX) 17 g packet Take 17 g by mouth daily as needed for moderate constipation. 02/14/23  Yes Haydee Lipa, MD  torsemide (DEMADEX) 20 MG tablet Take 4 tablets (80 mg total) by mouth daily. 05/16/23  Yes Gerald Kitty., NP    Family History Family History  Problem Relation Age of Onset   Diabetes Mother    Diabetes Father    Diabetes Brother    Colon cancer Neg Hx    Esophageal cancer Neg Hx    Pancreatic cancer Neg Hx    Stomach cancer Neg Hx    Liver disease Neg Hx    CAD Neg Hx     Social History Social History   Tobacco Use   Smoking status: Every Day    Current packs/day: 0.00    Average packs/day: 0.5 packs/day for 32.0 years (16.0 ttl pk-yrs)    Types: Cigarettes    Start date: 08/24/1989    Last attempt to quit: 08/24/2021    Years since quitting: 2.1    Passive exposure: Past   Smokeless tobacco: Former   Tobacco comments:    Quit smoking previously February 2023  Vaping Use   Vaping status: Never Used  Substance Use Topics   Alcohol use: No   Drug use: No     Allergies   Patient has no known allergies.   Review of Systems Review of Systems  Constitutional:  Negative for chills and fever.  HENT:  Negative for ear pain and sore throat.   Eyes:  Negative for pain and visual disturbance.  Respiratory:  Positive for shortness of breath. Negative for cough.   Cardiovascular:  Positive for chest pain. Negative for palpitations.  Gastrointestinal:  Negative for vomiting.  Genitourinary:  Negative for dysuria and hematuria.  Musculoskeletal:  Positive for arthralgias, back  pain and gait problem.  Skin:  Positive for color change and wound. Negative for rash.  Neurological:  Negative for seizures and syncope.  All other systems reviewed  and are negative.    Physical Exam Triage Vital Signs ED Triage Vitals  Encounter Vitals Group     BP 10/16/23 1550 (!) 163/90     Systolic BP Percentile --      Diastolic BP Percentile --      Pulse Rate 10/16/23 1550 73     Resp 10/16/23 1550 16     Temp 10/16/23 1550 (!) 97.3 F (36.3 C)     Temp Source 10/16/23 1550 Oral     SpO2 10/16/23 1550 92 %     Weight --      Height --      Head Circumference --      Peak Flow --      Pain Score 10/16/23 1548 9     Pain Loc --      Pain Education --      Exclude from Growth Chart --    No data found.  Updated Vital Signs BP (!) 163/90 (BP Location: Left Arm)   Pulse 73   Temp (!) 97.3 F (36.3 C) (Oral)   Resp 16   SpO2 92%   Visual Acuity Right Eye Distance:   Left Eye Distance:   Bilateral Distance:    Right Eye Near:   Left Eye Near:    Bilateral Near:     Physical Exam Vitals and nursing note reviewed.  Constitutional:      General: He is not in acute distress.    Appearance: He is well-developed.  HENT:     Head: Normocephalic and atraumatic.  Eyes:     Conjunctiva/sclera: Conjunctivae normal.  Cardiovascular:     Rate and Rhythm: Normal rate and regular rhythm.     Heart sounds: No murmur heard. Pulmonary:     Effort: Pulmonary effort is normal. No respiratory distress.     Breath sounds: Normal breath sounds.  Chest:    Abdominal:     Palpations: Abdomen is soft.     Tenderness: There is no abdominal tenderness.  Musculoskeletal:        General: Tenderness present. No swelling.     Cervical back: Neck supple.  Skin:    General: Skin is warm and dry.     Capillary Refill: Capillary refill takes less than 2 seconds.  Neurological:     Mental Status: He is alert.  Psychiatric:        Mood and Affect: Mood normal.     UC  Treatments / Results  Labs (all labs ordered are listed, but only abnormal results are displayed) Labs Reviewed - No data to display  EKG   Radiology No results found.  Procedures Procedures (including critical care time)  Medications Ordered in UC Medications - No data to display  Initial Impression / Assessment and Plan / UC Course  I have reviewed the triage vital signs and the nursing notes.  Pertinent labs & imaging results that were available during my care of the patient were reviewed by me and considered in my medical decision making (see chart for details).     Rib pain on right side - Plan: DG Ribs Unilateral W/Chest Right, DG Ribs Unilateral W/Chest Right   X-ray done today and final evaluation by the radiologist is still pending but there does appear to be a rib fracture that is minimally displaced.  At this time no further intervention is needed however precaution is needed for this area.  Avoid taking contact to this area.  If you fall on  the side then recommend going to the hospital for further evaluation.  The biggest risk with rib fractures is not being able to take big breaths.  Will give a incentive spirometer to use regularly at home to help encourage deep breaths.  Recommend contacting your primary care physician to let them know that you have a rib fracture and see if they recommend following up with them.  May return to urgent care if needed. Prescription called in for oxycodone/acetaminophen 5 mg 1 every 6 hours as needed for severe pain.   Final Clinical Impressions(s) / UC Diagnoses   Final diagnoses:  Rib pain on right side   Discharge Instructions   None    ED Prescriptions   None    PDMP not reviewed this encounter.   Kreg Pesa, [place] 10/16/23 1656

## 2023-10-16 NOTE — ED Triage Notes (Signed)
 Patient presenting with right rib pain onset this morning around 1100. Patient states he fell off the bed, denies any head injuries.  Prescriptions or OTC medications tried: No

## 2023-10-17 ENCOUNTER — Telehealth: Payer: Self-pay | Admitting: Internal Medicine

## 2023-10-17 NOTE — Telephone Encounter (Signed)
 Called patient due to chest x-ray results from previous urgent care visit.  It did note a rib fracture which was discussed with patient at urgent care visit.  It also notes patchy airspace opacities in the retrocardiac region as well as cardiomegaly and pulmonary vascular congestion.  Radiology was concerned for atelectasis versus pneumonia.  After further review of patient's chart, it appears that previous x-ray images have had similar results but unsure if this is changed significantly from previous.  I did ask patient about any new coughing, shortness of breath, fever, chills but he denies all of these current symptoms.  Patient states to me that these findings are normal on x-ray, and "this is why he gets dialysis".  He states that he has an appointment on Wednesday for follow-up.  Encouraged strict follow-up with PCP as well in case any additional chest imaging is necessary at his appears that he has routine CTs for this as well.  Therefore, given no concern for pneumonia at this time, will defer antibiotics. Patient verbalized understanding and was agreeable with this plan.  All questions answered.

## 2023-10-19 ENCOUNTER — Ambulatory Visit (HOSPITAL_COMMUNITY)
Admission: RE | Admit: 2023-10-19 | Discharge: 2023-10-19 | Disposition: A | Discharge status: Home / Self Care | Source: Ambulatory Visit | Attending: Nephrology | Admitting: Nephrology

## 2023-10-19 DIAGNOSIS — R188 Other ascites: Secondary | ICD-10-CM | POA: Diagnosis present

## 2023-10-19 DIAGNOSIS — Z87898 Personal history of other specified conditions: Secondary | ICD-10-CM

## 2023-10-19 HISTORY — PX: IR PARACENTESIS: IMG2679

## 2023-10-19 LAB — BODY FLUID CELL COUNT WITH DIFFERENTIAL
Eos, Fluid: 1 %
Lymphs, Fluid: 20 %
Monocyte-Macrophage-Serous Fluid: 78 % (ref 50–90)
Neutrophil Count, Fluid: 0 % (ref 0–25)
Other Cells, Fluid: 1 %
Total Nucleated Cell Count, Fluid: 395 uL (ref 0–1000)

## 2023-10-19 MED ORDER — LIDOCAINE HCL 1 % IJ SOLN
INTRAMUSCULAR | Status: AC
  Filled 2023-10-19: qty 20

## 2023-10-19 MED ORDER — LIDOCAINE HCL 1 % IJ SOLN
20.0000 mL | Freq: Once | INTRAMUSCULAR | Status: AC
  Administered 2023-10-19: 10 mL

## 2023-10-19 NOTE — Procedures (Signed)
 PROCEDURE SUMMARY:  Successful image-guided paracentesis from the left lower abdomen.  Yielded 3 liters of bloody fluid.  No immediate complications.  EBL: zero Patient tolerated well.   Specimen sent for labs. Ordering physician Dr. Jearldine Mina notified of bloody fluid appearance. Patient does endorse a fall 4/14 with rib fracture, seen same day at [facility]. He denies abd pain today. VSS throughout procedure.   Please see imaging section of Epic for full dictation.  Lusine Corlett NP 10/19/2023 3:13 PM

## 2023-10-22 LAB — BODY FLUID CULTURE W GRAM STAIN
Culture: NO GROWTH
Gram Stain: NONE SEEN

## 2023-11-03 ENCOUNTER — Other Ambulatory Visit (HOSPITAL_COMMUNITY): Payer: Self-pay | Admitting: Nephrology

## 2023-11-03 DIAGNOSIS — R188 Other ascites: Secondary | ICD-10-CM

## 2023-11-07 ENCOUNTER — Ambulatory Visit (HOSPITAL_COMMUNITY)
Admission: RE | Admit: 2023-11-07 | Discharge: 2023-11-07 | Disposition: A | Discharge status: Home / Self Care | Source: Ambulatory Visit | Attending: Nephrology | Admitting: Nephrology

## 2023-11-07 DIAGNOSIS — R188 Other ascites: Secondary | ICD-10-CM | POA: Insufficient documentation

## 2023-11-07 HISTORY — PX: IR PARACENTESIS: IMG2679

## 2023-11-07 MED ORDER — LIDOCAINE HCL 1 % IJ SOLN
10.0000 mL | Freq: Once | INTRAMUSCULAR | Status: AC
Start: 2023-11-07 — End: 2023-11-07
  Administered 2023-11-07: 10 mL via INTRADERMAL

## 2023-11-07 MED ORDER — LIDOCAINE HCL 1 % IJ SOLN
INTRAMUSCULAR | Status: AC
  Filled 2023-11-07: qty 20

## 2023-11-07 NOTE — Procedures (Signed)
 PROCEDURE SUMMARY:  Successful image-guided paracentesis from the right abdomen.  Yielded 1.9 liters of amber fluid.  No immediate complications.  EBL: zero Patient tolerated well.   Specimen not sent for labs.  Please see imaging section of Epic for full dictation.  Amira Podolak NP 11/07/2023 2:26 PM

## 2023-11-11 ENCOUNTER — Encounter: Payer: Self-pay | Admitting: Family

## 2023-11-11 ENCOUNTER — Ambulatory Visit (INDEPENDENT_AMBULATORY_CARE_PROVIDER_SITE_OTHER): Admitting: Family

## 2023-11-11 DIAGNOSIS — L98499 Non-pressure chronic ulcer of skin of other sites with unspecified severity: Secondary | ICD-10-CM | POA: Diagnosis not present

## 2023-11-11 DIAGNOSIS — R600 Localized edema: Secondary | ICD-10-CM

## 2023-11-11 DIAGNOSIS — I872 Venous insufficiency (chronic) (peripheral): Secondary | ICD-10-CM

## 2023-11-11 NOTE — Progress Notes (Signed)
 Office Visit Note   Patient: Todd Mccoy           Date of Birth: 08-01-1970           MRN: [id number] Visit Date: 11/11/2023              Requested by: Joaquin Mulberry, MD 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  Who presents today for evaluation of an ulcer to his right foot  Reports his lower extremity edema has been poorly controlled lately he has been compliant with his dialysis April 4 had an emergency department visit for onset of ulcer to the right lateral column at this point it was a blister fluid-filled he did complete a course of doxycycline .  Reports the emergency department advised he not wear his compression stockings while ulcer was present  ABIs done last year were normal  Assessment & Plan: Visit Diagnoses: No diagnosis found.  Plan: Placed on a Dynaflex compression wrap today.  Plan to follow-up in 1 week  Follow-Up Instructions: No follow-ups on file.   Ortho Exam  Patient is alert, oriented, no adenopathy, well-dressed, normal affect, normal respiratory effort. On examination right lower extremity there is 2+ pitting edema there is induration there is no erythema or weeping.  Over the distal lateral calf remains open ulcer which is 2 and half centimeters in diameter there is scant surrounding maceration there is no erythema warmth no sign of infection  Imaging: No results found. No images are attached to the encounter.  Labs: Lab Results  Component Value Date   HGBA1C 7.0 08/31/2023   HGBA1C 5.6 02/06/2023   HGBA1C 6.9 10/27/2022   ESRSEDRATE 134 (H) 12/10/2020   ESRSEDRATE 126 (H) 03/24/2020   CRP 29.8 (H) 12/10/2020   LABURIC 9.1 (H) 12/15/2020   REPTSTATUS 10/22/2023 FINAL 10/19/2023    GRAMSTAIN NO WBC SEEN NO ORGANISMS SEEN  10/19/2023   CULT  10/19/2023    NO GROWTH 3 DAYS Performed at [facility], [address], [place], [name] [address]    [facility] STAPHYLOCOCCUS AUREUS 09/08/2021     Lab Results  Component Value Date   ALBUMIN  3.0 (L) 09/21/2023   ALBUMIN  3.6 (L) 09/13/2023   ALBUMIN  3.6 (L) 08/31/2023   PREALBUMIN 5.5 (L) 12/10/2020    Lab Results  Component Value Date   MG 1.5 (L) 02/14/2023   MG 1.4 (L) 02/13/2023   MG 1.7 02/12/2023   Lab Results  Component Value Date   VD25OH 10.5 (L) 08/31/2023    Lab Results  Component Value Date   PREALBUMIN 5.5 (L) 12/10/2020      Latest Ref Rng & Units 10/07/2023    2:10 AM 09/21/2023    1:18 AM 05/23/2023    7:31 AM  CBC EXTENDED  WBC 4.0 - 10.5 K/uL 8.9  3.7    RBC 4.22 - 5.81 MIL/uL 3.40  4.26    Hemoglobin 13.0 - 17.0 g/dL 9.9  30.8  65.7   HCT 84.6 - 52.0 % 31.9  38.4  36.0   Platelets 150 - 400 K/uL 100  132    NEUT# 1.7 - 7.7 K/uL  2.4    Lymph# 0.7 - 4.0 K/uL  0.8  There is no height or weight on file to calculate BMI.  Orders:  No orders of the defined types were placed in this encounter.  No orders of the defined types were placed in this encounter.    Procedures: No procedures performed  Clinical Data: No additional findings.  ROS:  All other systems negative, except as noted in the HPI. Review of Systems  Objective: Vital Signs: There were no vitals taken for this visit.  Specialty Comments:  No specialty comments available.  PMFS History: Patient Active Problem List   Diagnosis Date Noted   Skin ulcer of right great toe, limited to breakdown of skin (HCC) 10/07/2023   Diabetic polyneuropathy associated with diabetes mellitus due to underlying condition (HCC) 10/05/2023   Other insomnia 10/05/2023   Bilateral carpal tunnel syndrome 10/05/2023   Cubital tunnel syndrome, bilateral 10/05/2023   Chronic pain syndrome 10/05/2023   Encounter for pain  management 10/05/2023   Pulmonary HTN (HCC)    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/24/2023   End stage renal disease (HCC) 02/19/2023   Acquired absence of left leg below knee (HCC) 02/14/2023   Acquired absence of other right toe(s) (HCC) 02/14/2023   Dependence on renal dialysis (HCC) 02/14/2023   Hyperlipidemia, unspecified 02/14/2023   Hypertensive heart and chronic kidney disease with heart failure and with stage 5 chronic kidney disease, or end stage renal disease (HCC) 02/14/2023   Nicotine dependence, cigarettes, uncomplicated 02/14/2023   Type 2 diabetes mellitus with diabetic neuropathy, unspecified (HCC) 02/14/2023   Type 2 diabetes mellitus with diabetic peripheral angiopathy without gangrene (HCC) 02/14/2023   Type 2 diabetes mellitus with unspecified diabetic retinopathy without macular edema (HCC) 02/14/2023   Diarrhea of presumed infectious origin 02/08/2023   Metabolic acidosis 02/08/2023   Anemia of chronic disease 02/08/2023   Acute renal failure superimposed on chronic kidney disease (HCC) 02/08/2023   Encephalopathy acute 02/07/2023   Shock circulatory (HCC) 02/05/2023   PAD (peripheral artery disease) (HCC) 10/27/2022   Renal insufficiency    Acute on chronic systolic CHF (congestive heart failure) (HCC) 01/23/2022   Prolonged QT interval 01/23/2022   Chest pain 01/23/2022   Elevated troponin 01/23/2022   Anemia due to chronic kidney disease 01/23/2022   Hypertensive emergency 01/23/2022   Hyponatremia 01/23/2022   Chronic combined systolic (congestive) and diastolic (congestive) heart failure (HCC) 04/07/2021   Chronic renal disease, stage 4, severely decreased glomerular filtration rate between 15-29 mL/min/1.73 square meter (HCC)    Hypertensive urgency    Acute kidney injury superimposed on chronic kidney disease (HCC)    Acute CHF (congestive heart failure) (HCC) 02/27/2021   Normocytic anemia 02/27/2021   Anasarca 02/27/2021   Abscess  of left foot 12/10/2020   S/P BKA (below knee amputation) unilateral, left (HCC) 12/10/2020   Dyslipidemia 12/10/2020   CKD (chronic kidney disease), stage III (HCC) 12/10/2020   HTN (hypertension) 11/12/2020   Cellulitis and abscess of foot    Diabetes mellitus with hyperglycemia (HCC) 09/07/2014   Tobacco abuse 09/07/2014   Abscess of back    Abscess of lower back 09/06/2014   DM2 (diabetes mellitus, type 2) (HCC) 09/06/2014   Sepsis (HCC) 09/06/2014   Scrotal abscess 06/20/2014   Past Medical History:  Diagnosis Date   Anemia    Anemia    Anxiety    Chronic combined systolic (congestive) and diastolic (congestive) heart failure (HCC)    45-50% with GLS -13%, G2DD, mild to moderate PTHN   CKD (chronic kidney disease),  stage IV (HCC) 12/10/2020   Diabetes mellitus with complication (HCC)    Diabetic neuropathy (HCC)    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/2020   pt states retinal eye damage- left worse than the right- recent visited MD    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/2020   pt states recent MD visit /left eye worse than right   Dyslipidemia 12/10/2020   Hx of BKA, left (HCC)    Hypertension    Morbid obesity (HCC)    Osteomyelitis (HCC)    Pneumonia    Pulmonary HTN (HCC)    mild to moderate (PASP90mmHg) by echo 07/2023    Family History  Problem Relation Age of Onset   Diabetes Mother    Diabetes Father    Diabetes Brother    Colon cancer Neg Hx    Esophageal cancer Neg Hx    Pancreatic cancer Neg Hx    Stomach cancer Neg Hx    Liver disease Neg Hx    CAD Neg Hx     Past Surgical History:  Procedure Laterality Date   ABSCESS DRAINAGE     neck   AMPUTATION Left 11/14/2020   Procedure: LEFT 5TH RAY AMPUTATION;  Surgeon: Timothy Ford, MD;  Location: [facility] OR;  Service: Orthopedics;  Laterality: Left;   AMPUTATION Left 12/10/2020   Procedure: LEFT BELOW KNEE AMPUTATION;  Surgeon: Timothy Ford, MD;  Location: [facility] OR;  Service:  Orthopedics;  Laterality: Left;   AMPUTATION TOE Right 03/25/2020   Procedure: AMPUTATION TOE;  Surgeon: Camilo Cella, DPM;  Location: [facility] ORS;  Service: Podiatry;  Laterality: Right;   AV FISTULA PLACEMENT Right 05/23/2023   Procedure: RIGHT ARM Brachiocephalic ARTERIOVENOUS (AV) FISTULA CREATION;  Surgeon: Kayla Part, MD;  Location: [facility] OR;  Service: Vascular;  Laterality: Right;   INCISION AND DRAINAGE ABSCESS Right 09/07/2014   Procedure: INCISION AND DRAINAGE ABSCESS RIGHT FLANK;  Surgeon: Adalberto Hollow, MD;  Location: [facility] ORS;  Service: General;  Laterality: Right;   IR FLUORO GUIDE CV LINE RIGHT  02/11/2023   IR PARACENTESIS  10/03/2023   IR PARACENTESIS  10/19/2023   IR PARACENTESIS  11/07/2023   IR US  GUIDE VASC ACCESS RIGHT  02/11/2023   LEG AMPUTATION BELOW KNEE Left 12/10/2020   SCROTUM EXPLORATION     Social History   Occupational History   Not on file  Tobacco Use   Smoking status: Every Day    Current packs/day: 0.00    Average packs/day: 0.5 packs/day for 32.0 years (16.0 ttl pk-yrs)    Types: Cigarettes    Start date: 08/24/1989    Last attempt to quit: 08/24/2021    Years since quitting: 2.2    Passive exposure: Past   Smokeless tobacco: Former   Tobacco comments:    Quit smoking previously February 2023  Vaping Use   Vaping status: Never Used  Substance and Sexual Activity   Alcohol  use: No   Drug use: No   Sexual activity: Not on file

## 2023-11-16 ENCOUNTER — Ambulatory Visit: Admitting: Family

## 2023-11-16 DIAGNOSIS — L97512 Non-pressure chronic ulcer of other part of right foot with fat layer exposed: Secondary | ICD-10-CM | POA: Diagnosis not present

## 2023-11-16 DIAGNOSIS — I872 Venous insufficiency (chronic) (peripheral): Secondary | ICD-10-CM

## 2023-11-16 DIAGNOSIS — R600 Localized edema: Secondary | ICD-10-CM | POA: Diagnosis not present

## 2023-11-16 DIAGNOSIS — L98499 Non-pressure chronic ulcer of skin of other sites with unspecified severity: Secondary | ICD-10-CM | POA: Diagnosis not present

## 2023-11-16 MED ORDER — DOXYCYCLINE HYCLATE 100 MG PO CAPS: 100.0000 mg | ORAL_CAPSULE | Freq: Two times a day (BID) | ORAL | 0 refills | Status: DC

## 2023-11-16 NOTE — Progress Notes (Signed)
 Office Visit Note   Patient: Todd Mccoy           Date of Birth: 1970-09-01           MRN: [id number] Visit Date: 11/16/2023              Requested by: Joaquin Mulberry, MD [address] [place],  [name] [address] PCP: Joaquin Mulberry, MD  Chief Complaint  Patient presents with   Right Foot - Wound Check, Follow-up      HPI: The patient is a 53 year old gentleman who is seen in follow-up for ulcer to the right foot laterally.  He was in a Dynaflex compression wrap for the last 1 week  Unfortunately now has a foul odor denies fevers or chills  Assessment & Plan: Visit Diagnoses: No diagnosis found.  Plan: Begin daily Vashe dressing changes cleanse with Vashe will apply silver cell and gauze we will reach out to home health to request their assistance.  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   Ortho Exam  Patient is alert, oriented, no adenopathy, well-dressed, normal affect, normal respiratory effort. On examination right lower extremity there is good wrinkling of the skin up to the area where the compression wrap was in place there is proximal edema.  To the foot laterally there is ulcer over the fifth metatarsal head this is 2 cm in diameter filled in with necrotic tissue.  Probes to fascia today there is a foul odor there is no purulence no bloody drainage  Imaging: No results found. No images are attached to the encounter.  Labs: Lab Results  Component Value Date   HGBA1C 7.0 08/31/2023   HGBA1C 5.6 02/06/2023   HGBA1C 6.9 10/27/2022   ESRSEDRATE 134 (H) 12/10/2020   ESRSEDRATE 126 (H) 03/24/2020   CRP 29.8 (H) 12/10/2020   LABURIC 9.1 (H) 12/15/2020   REPTSTATUS 10/22/2023 FINAL 10/19/2023   GRAMSTAIN NO WBC SEEN NO ORGANISMS SEEN  10/19/2023   CULT  10/19/2023    NO GROWTH 3 DAYS Performed at [facility], [address], [place], [name] [address]    [facility]  STAPHYLOCOCCUS AUREUS 09/08/2021     Lab Results  Component Value Date   ALBUMIN  3.0 (L) 09/21/2023   ALBUMIN  3.6 (L) 09/13/2023   ALBUMIN  3.6 (L) 08/31/2023   PREALBUMIN 5.5 (L) 12/10/2020    Lab Results  Component Value Date   MG 1.5 (L) 02/14/2023   MG 1.4 (L) 02/13/2023   MG 1.7 02/12/2023   Lab Results  Component Value Date   VD25OH 10.5 (L) 08/31/2023    Lab Results  Component Value Date   PREALBUMIN 5.5 (L) 12/10/2020      Latest Ref Rng & Units 10/07/2023    2:10 AM 09/21/2023    1:18 AM 05/23/2023    7:31 AM  CBC EXTENDED  WBC 4.0 - 10.5 K/uL 8.9  3.7    RBC 4.22 - 5.81 MIL/uL 3.40  4.26    Hemoglobin 13.0 - 17.0 g/dL 9.9  72.5  36.6   HCT 44.0 - 52.0 % 31.9  38.4  36.0   Platelets 150 - 400 K/uL 100  132    NEUT# 1.7 - 7.7 K/uL  2.4    Lymph# 0.7 - 4.0 K/uL  0.8       There is no height or weight on file to calculate BMI.  Orders:  No orders  of the defined types were placed in this encounter.  No orders of the defined types were placed in this encounter.    Procedures: No procedures performed  Clinical Data: No additional findings.  ROS:  All other systems negative, except as noted in the HPI. Review of Systems  Objective: Vital Signs: There were no vitals taken for this visit.  Specialty Comments:  No specialty comments available.  PMFS History: Patient Active Problem List   Diagnosis Date Noted   Skin ulcer of right great toe, limited to breakdown of skin (HCC) 10/07/2023   Diabetic polyneuropathy associated with diabetes mellitus due to underlying condition (HCC) 10/05/2023   Other insomnia 10/05/2023   Bilateral carpal tunnel syndrome 10/05/2023   Cubital tunnel syndrome, bilateral 10/05/2023   Chronic pain syndrome 10/05/2023   Encounter for pain management 10/05/2023   Pulmonary HTN (HCC)    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/24/2023   End stage renal disease (HCC) 02/19/2023   Acquired absence of left  leg below knee (HCC) 02/14/2023   Acquired absence of other right toe(s) (HCC) 02/14/2023   Dependence on renal dialysis (HCC) 02/14/2023   Hyperlipidemia, unspecified 02/14/2023   Hypertensive heart and chronic kidney disease with heart failure and with stage 5 chronic kidney disease, or end stage renal disease (HCC) 02/14/2023   Nicotine dependence, cigarettes, uncomplicated 02/14/2023   Type 2 diabetes mellitus with diabetic neuropathy, unspecified (HCC) 02/14/2023   Type 2 diabetes mellitus with diabetic peripheral angiopathy without gangrene (HCC) 02/14/2023   Type 2 diabetes mellitus with unspecified diabetic retinopathy without macular edema (HCC) 02/14/2023   Diarrhea of presumed infectious origin 02/08/2023   Metabolic acidosis 02/08/2023   Anemia of chronic disease 02/08/2023   Acute renal failure superimposed on chronic kidney disease (HCC) 02/08/2023   Encephalopathy acute 02/07/2023   Shock circulatory (HCC) 02/05/2023   PAD (peripheral artery disease) (HCC) 10/27/2022   Renal insufficiency    Acute on chronic systolic CHF (congestive heart failure) (HCC) 01/23/2022   Prolonged QT interval 01/23/2022   Chest pain 01/23/2022   Elevated troponin 01/23/2022   Anemia due to chronic kidney disease 01/23/2022   Hypertensive emergency 01/23/2022   Hyponatremia 01/23/2022   Chronic combined systolic (congestive) and diastolic (congestive) heart failure (HCC) 04/07/2021   Chronic renal disease, stage 4, severely decreased glomerular filtration rate between 15-29 mL/min/1.73 square meter (HCC)    Hypertensive urgency    Acute kidney injury superimposed on chronic kidney disease (HCC)    Acute CHF (congestive heart failure) (HCC) 02/27/2021   Normocytic anemia 02/27/2021   Anasarca 02/27/2021   Abscess of left foot 12/10/2020   S/P BKA (below knee amputation) unilateral, left (HCC) 12/10/2020   Dyslipidemia 12/10/2020   CKD (chronic kidney disease), stage III (HCC) 12/10/2020    HTN (hypertension) 11/12/2020   Cellulitis and abscess of foot    Diabetes mellitus with hyperglycemia (HCC) 09/07/2014   Tobacco abuse 09/07/2014   Abscess of back    Abscess of lower back 09/06/2014   DM2 (diabetes mellitus, type 2) (HCC) 09/06/2014   Sepsis (HCC) 09/06/2014   Scrotal abscess 06/20/2014   Past Medical History:  Diagnosis Date   Anemia    Anemia    Anxiety    Chronic combined systolic (congestive) and diastolic (congestive) heart failure (HCC)    45-50% with GLS -13%, G2DD, mild to moderate PTHN   CKD (chronic kidney disease), stage IV (HCC) 12/10/2020   Diabetes mellitus with complication (HCC)    Diabetic neuropathy (  HCC)    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/2020   pt states retinal eye damage- left worse than the right- recent visited MD    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/2020   pt states recent MD visit /left eye worse than right   Dyslipidemia 12/10/2020   Hx of BKA, left (HCC)    Hypertension    Morbid obesity (HCC)    Osteomyelitis (HCC)    Pneumonia    Pulmonary HTN (HCC)    mild to moderate (PASP81mmHg) by echo 07/2023    Family History  Problem Relation Age of Onset   Diabetes Mother    Diabetes Father    Diabetes Brother    Colon cancer Neg Hx    Esophageal cancer Neg Hx    Pancreatic cancer Neg Hx    Stomach cancer Neg Hx    Liver disease Neg Hx    CAD Neg Hx     Past Surgical History:  Procedure Laterality Date   ABSCESS DRAINAGE     neck   AMPUTATION Left 11/14/2020   Procedure: LEFT 5TH RAY AMPUTATION;  Surgeon: Timothy Ford, MD;  Location: [facility] OR;  Service: Orthopedics;  Laterality: Left;   AMPUTATION Left 12/10/2020   Procedure: LEFT BELOW KNEE AMPUTATION;  Surgeon: Timothy Ford, MD;  Location: [facility] OR;  Service: Orthopedics;  Laterality: Left;   AMPUTATION TOE Right 03/25/2020   Procedure: AMPUTATION TOE;  Surgeon: Camilo Cella, DPM;  Location: [facility]S;  Service: Podiatry;  Laterality: Right;    AV FISTULA PLACEMENT Right 05/23/2023   Procedure: RIGHT ARM Brachiocephalic ARTERIOVENOUS (AV) FISTULA CREATION;  Surgeon: Kayla Part, MD;  Location: [facility] OR;  Service: Vascular;  Laterality: Right;   INCISION AND DRAINAGE ABSCESS Right 09/07/2014   Procedure: INCISION AND DRAINAGE ABSCESS RIGHT FLANK;  Surgeon: Adalberto Hollow, MD;  Location: [facility];  Service: General;  Laterality: Right;   IR FLUORO GUIDE CV LINE RIGHT  02/11/2023   IR PARACENTESIS  10/03/2023   IR PARACENTESIS  10/19/2023   IR PARACENTESIS  11/07/2023   IR US  GUIDE VASC ACCESS RIGHT  02/11/2023   LEG AMPUTATION BELOW KNEE Left 12/10/2020   SCROTUM EXPLORATION     Social History   Occupational History   Not on file  Tobacco Use   Smoking status: Every Day    Current packs/day: 0.00    Average packs/day: 0.5 packs/day for 32.0 years (16.0 ttl pk-yrs)    Types: Cigarettes    Start date: 08/24/1989    Last attempt to quit: 08/24/2021    Years since quitting: 2.2    Passive exposure: Past   Smokeless tobacco: Former   Tobacco comments:    Quit smoking previously February 2023  Vaping Use   Vaping status: Never Used  Substance and Sexual Activity   Alcohol  use: No   Drug use: No   Sexual activity: Not on file

## 2023-11-18 ENCOUNTER — Ambulatory Visit: Admitting: Family

## 2023-11-21 ENCOUNTER — Other Ambulatory Visit (HOSPITAL_COMMUNITY): Payer: Self-pay | Admitting: Nephrology

## 2023-11-21 ENCOUNTER — Encounter (HOSPITAL_COMMUNITY): Payer: Self-pay | Admitting: Nephrology

## 2023-11-21 DIAGNOSIS — Z5189 Encounter for other specified aftercare: Secondary | ICD-10-CM

## 2023-11-21 DIAGNOSIS — N189 Chronic kidney disease, unspecified: Secondary | ICD-10-CM

## 2023-11-23 ENCOUNTER — Encounter (HOSPITAL_BASED_OUTPATIENT_CLINIC_OR_DEPARTMENT_OTHER): Attending: General Surgery | Admitting: General Surgery

## 2023-11-23 DIAGNOSIS — L97513 Non-pressure chronic ulcer of other part of right foot with necrosis of muscle: Secondary | ICD-10-CM | POA: Insufficient documentation

## 2023-11-23 DIAGNOSIS — N186 End stage renal disease: Secondary | ICD-10-CM | POA: Insufficient documentation

## 2023-11-23 DIAGNOSIS — E1122 Type 2 diabetes mellitus with diabetic chronic kidney disease: Secondary | ICD-10-CM | POA: Insufficient documentation

## 2023-11-23 DIAGNOSIS — E11621 Type 2 diabetes mellitus with foot ulcer: Secondary | ICD-10-CM | POA: Diagnosis present

## 2023-11-23 DIAGNOSIS — I5042 Chronic combined systolic (congestive) and diastolic (congestive) heart failure: Secondary | ICD-10-CM | POA: Insufficient documentation

## 2023-11-25 ENCOUNTER — Ambulatory Visit: Attending: Cardiology | Admitting: Cardiology

## 2023-11-30 ENCOUNTER — Encounter: Attending: Physical Medicine and Rehabilitation | Admitting: Physical Medicine and Rehabilitation

## 2023-11-30 ENCOUNTER — Ambulatory Visit: Admitting: Family

## 2023-11-30 ENCOUNTER — Ambulatory Visit (HOSPITAL_COMMUNITY)
Admission: RE | Admit: 2023-11-30 | Discharge: 2023-11-30 | Disposition: A | Discharge status: Home / Self Care | Source: Ambulatory Visit | Attending: Nephrology | Admitting: Nephrology

## 2023-11-30 ENCOUNTER — Encounter (HOSPITAL_COMMUNITY): Payer: Self-pay | Admitting: Radiology

## 2023-11-30 DIAGNOSIS — G894 Chronic pain syndrome: Secondary | ICD-10-CM | POA: Insufficient documentation

## 2023-11-30 DIAGNOSIS — R52 Pain, unspecified: Secondary | ICD-10-CM | POA: Insufficient documentation

## 2023-11-30 DIAGNOSIS — G5603 Carpal tunnel syndrome, bilateral upper limbs: Secondary | ICD-10-CM | POA: Insufficient documentation

## 2023-11-30 DIAGNOSIS — Z4901 Encounter for fitting and adjustment of extracorporeal dialysis catheter: Secondary | ICD-10-CM | POA: Insufficient documentation

## 2023-11-30 DIAGNOSIS — N186 End stage renal disease: Secondary | ICD-10-CM | POA: Diagnosis not present

## 2023-11-30 DIAGNOSIS — N189 Chronic kidney disease, unspecified: Secondary | ICD-10-CM

## 2023-11-30 DIAGNOSIS — R188 Other ascites: Secondary | ICD-10-CM | POA: Insufficient documentation

## 2023-11-30 DIAGNOSIS — L97511 Non-pressure chronic ulcer of other part of right foot limited to breakdown of skin: Secondary | ICD-10-CM | POA: Insufficient documentation

## 2023-11-30 DIAGNOSIS — E0842 Diabetes mellitus due to underlying condition with diabetic polyneuropathy: Secondary | ICD-10-CM | POA: Insufficient documentation

## 2023-11-30 DIAGNOSIS — Z5189 Encounter for other specified aftercare: Secondary | ICD-10-CM

## 2023-11-30 DIAGNOSIS — G5623 Lesion of ulnar nerve, bilateral upper limbs: Secondary | ICD-10-CM | POA: Insufficient documentation

## 2023-11-30 DIAGNOSIS — G4709 Other insomnia: Secondary | ICD-10-CM | POA: Insufficient documentation

## 2023-11-30 HISTORY — PX: IR PARACENTESIS: IMG2679

## 2023-11-30 HISTORY — PX: IR REMOVAL TUN CV CATH W/O FL: IMG2289

## 2023-11-30 MED ORDER — LIDOCAINE HCL 1 % IJ SOLN
20.0000 mL | Freq: Once | INTRAMUSCULAR | Status: AC
  Administered 2023-11-30: 20 mL

## 2023-11-30 MED ORDER — CHLORHEXIDINE GLUCONATE 4 % EX SOLN
CUTANEOUS | Status: AC
  Filled 2023-11-30: qty 15

## 2023-11-30 NOTE — Procedures (Signed)
 PROCEDURE SUMMARY:  Successful US  guided paracentesis from left lateral abdomen.  Yielded 4 liters of amber fluid.  No immediate complications.  Patient tolerated well.  EBL = trace   Neomia Banner Brennan Litzinger PA-C 11/30/2023 3:25 PM

## 2023-11-30 NOTE — Procedures (Signed)
 Interventional Radiology Procedure Note  PROCEDURE SUMMARY:  Successful removal of right tunneled catheter.  No complications.   EBL = trace  Please see full dictation in imaging section of Epic for procedure details.  Electronically Signed: Kentrel Clevenger M Benney [place], PA-C 11/30/2023, 3:46 PM

## 2023-12-04 ENCOUNTER — Encounter (HOSPITAL_COMMUNITY): Payer: Self-pay | Admitting: Emergency Medicine

## 2023-12-04 ENCOUNTER — Other Ambulatory Visit: Payer: Self-pay

## 2023-12-04 ENCOUNTER — Ambulatory Visit (HOSPITAL_COMMUNITY): Admission: EM | Admit: 2023-12-04 | Discharge: 2023-12-04 | Disposition: A | Discharge status: Home / Self Care

## 2023-12-04 DIAGNOSIS — I10 Essential (primary) hypertension: Secondary | ICD-10-CM

## 2023-12-04 DIAGNOSIS — S00411A Abrasion of right ear, initial encounter: Secondary | ICD-10-CM | POA: Diagnosis not present

## 2023-12-04 MED ORDER — NEOMYCIN-POLYMYXIN-HC 3.5-10000-1 OT SUSP: 4.0000 [drp] | Freq: Three times a day (TID) | OTIC | 0 refills | Status: AC

## 2023-12-04 NOTE — ED Triage Notes (Signed)
 Patient reports right ear is bleeding.  He was cleaning ear with q-tip and suspects he went too far,  patient says he is able to hear

## 2023-12-04 NOTE — ED Provider Notes (Signed)
 [facility]   MRN: [id number] DOB: [birth year]  Subjective:   Todd Mccoy is a 53 y.o. male presenting for bleeding in the right ear canal after using a Q-tip today.  He states that he is able to hear well.  He denies any pain.  He is here with his niece.  His blood pressure is also noted to be elevated.  No headache or dizziness.  No chest pain or shortness of breath.  No other symptoms.  No current facility-administered medications for this encounter.  Current Outpatient Medications:    gentamicin ointment (GARAMYCIN) 0.1 %, SMARTSIG:sparingly Topical, Disp: , Rfl:    levofloxacin (LEVAQUIN) 250 MG tablet, Take by mouth., Disp: , Rfl:    minocycline (MINOCIN) 100 MG capsule, Take 100 mg by mouth 2 (two) times daily., Disp: , Rfl:    neomycin-polymyxin-hydrocortisone (CORTISPORIN) 3.5-10000-1 OTIC suspension, Place 4 drops into the right ear 3 (three) times daily for 5 days., Disp: 10 mL, Rfl: 0   amLODipine  (NORVASC ) 10 MG tablet, Take 1 tablet (10 mg total) by mouth daily., Disp: 30 tablet, Rfl: 0   atorvastatin  (LIPITOR) 20 MG tablet, TAKE 1 TABLET (20 MG TOTAL) BY MOUTH DAILY., Disp: 90 tablet, Rfl: 3   calcitRIOL  (ROCALTROL ) 0.25 MCG capsule, Take 1 capsule (0.25 mcg total) by mouth 2 (two) times daily., Disp: 180 capsule, Rfl: 3   carvedilol  (COREG ) 25 MG tablet, TAKE 1 TABLET BY MOUTH TWICE DAILY WITH A MEAL . APPOINTMENT REQUIRED FOR FUTURE REFILLS, Disp: 90 tablet, Rfl: 2   docusate sodium  (COLACE) 100 MG capsule, Take 1 capsule (100 mg total) by mouth 2 (two) times daily as needed for mild constipation., Disp: 10 capsule, Rfl: 0   doxycycline  (VIBRAMYCIN ) 100 MG capsule, Take 1 capsule (100 mg total) by mouth 2 (two) times daily., Disp: 28 capsule, Rfl: 0   ferrous sulfate  325 (65 FE) MG EC tablet, Take 1 tablet (325 mg total) by mouth daily with breakfast., Disp: 30 tablet, Rfl: 3   furosemide  (LASIX ) 40 MG tablet, Take 1 tablet (40 mg total) by mouth 2  (two) times daily., Disp: 60 tablet, Rfl: 2   gabapentin  (NEURONTIN ) 300 MG capsule, Take 2 capsules (600 mg total) by mouth 2 (two) times daily., Disp: 120 capsule, Rfl: 3   hydrALAZINE  (APRESOLINE ) 50 MG tablet, Take 50 mg by mouth 3 (three) times daily., Disp: , Rfl:    hydrOXYzine  (ATARAX ) 50 MG tablet, Take 1 tablet (50 mg total) by mouth 3 (three) times daily as needed for anxiety or itching., Disp: 30 tablet, Rfl: 2   insulin  glargine (LANTUS ) 100 UNIT/ML injection, Inject 0.05 mLs (5 Units total) into the skin at bedtime., Disp: 10 mL, Rfl: 2   isosorbide  mononitrate (IMDUR ) 60 MG 24 hr tablet, Take 1 tablet (60 mg total) by mouth daily., Disp: 60 tablet, Rfl: 2   magnesium  oxide (MAG-OX) 400 (240 Mg) MG tablet, Take 1 tablet (400 mg total) by mouth 2 (two) times daily., Disp: 60 tablet, Rfl: 2   multivitamin (RENA-VIT) TABS tablet, Take 1 tablet by mouth at bedtime., Disp: 30 tablet, Rfl: 2   nortriptyline  (PAMELOR ) 25 MG capsule, Take 2 capsules (50 mg total) by mouth at bedtime. Start with 1 capsule at nighttime for 1 week, then if no side effects increase to 2 capsules at nighttime, Disp: 60 capsule, Rfl: 3   oxyCODONE -acetaminophen  (PERCOCET/ROXICET) 5-325 MG tablet, Take 1 tablet by mouth every 6 (six) hours as needed for severe  pain (pain score 7-10)., Disp: 10 tablet, Rfl: 0   polyethylene glycol (MIRALAX  / GLYCOLAX ) 17 g packet, Take 17 g by mouth daily as needed for moderate constipation., Disp: 14 each, Rfl: 0   torsemide  (DEMADEX ) 20 MG tablet, Take 4 tablets (80 mg total) by mouth daily., Disp: 360 tablet, Rfl: 0   No Known Allergies  Past Medical History:  Diagnosis Date   Anemia    Anemia    Anxiety    Chronic combined systolic (congestive) and diastolic (congestive) heart failure (HCC)    45-50% with GLS -13%, G2DD, mild to moderate PTHN   CKD (chronic kidney disease), stage IV (HCC) 12/10/2020   Diabetes mellitus with complication (HCC)    Diabetic neuropathy (HCC)     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/2020   pt states retinal eye damage- left worse than the right- recent visited MD    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/2020   pt states recent MD visit /left eye worse than right   Dyslipidemia 12/10/2020   Hx of BKA, left (HCC)    Hypertension    Morbid obesity (HCC)    Osteomyelitis (HCC)    Pneumonia    Pulmonary HTN (HCC)    mild to moderate (PASP43mmHg) by echo 07/2023     Past Surgical History:  Procedure Laterality Date   ABSCESS DRAINAGE     neck   AMPUTATION Left 11/14/2020   Procedure: LEFT 5TH RAY AMPUTATION;  Surgeon: Timothy Ford, MD;  Location: [facility] OR;  Service: Orthopedics;  Laterality: Left;   AMPUTATION Left 12/10/2020   Procedure: LEFT BELOW KNEE AMPUTATION;  Surgeon: Timothy Ford, MD;  Location: [facility] OR;  Service: Orthopedics;  Laterality: Left;   AMPUTATION TOE Right 03/25/2020   Procedure: AMPUTATION TOE;  Surgeon: Camilo Cella, DPM;  Location: [facility] ORS;  Service: Podiatry;  Laterality: Right;   AV FISTULA PLACEMENT Right 05/23/2023   Procedure: RIGHT ARM Brachiocephalic ARTERIOVENOUS (AV) FISTULA CREATION;  Surgeon: Kayla Part, MD;  Location[facility] OR;  Service: Vascular;  Laterality: Right;   INCISION AND DRAINAGE ABSCESS Right 09/07/2014   Procedure: INCISION AND DRAINAGE ABSCESS RIGHT FLANK;  Surgeon: Adalberto Hollow, MD;  Location: [facility] ORS;  Service: General;  Laterality: Right;   IR FLUORO GUIDE CV LINE RIGHT  02/11/2023   IR PARACENTESIS  10/03/2023   IR PARACENTESIS  10/19/2023   IR PARACENTESIS  11/07/2023   IR PARACENTESIS  11/30/2023   IR REMOVAL TUN CV CATH W/O FL  11/30/2023   IR US  GUIDE VASC ACCESS RIGHT  02/11/2023   LEG AMPUTATION BELOW KNEE Left 12/10/2020   SCROTUM EXPLORATION      Family History  Problem Relation Age of Onset   Diabetes Mother    Diabetes Father    Diabetes Brother    Colon cancer Neg Hx    Esophageal cancer Neg Hx    Pancreatic cancer Neg Hx    Stomach  cancer Neg Hx    Liver disease Neg Hx    CAD Neg Hx     Social History   Tobacco Use   Smoking status: Every Day    Current packs/day: 0.00    Average packs/day: 0.5 packs/day for 32.0 years (16.0 ttl pk-yrs)    Types: Cigarettes    Start date: 08/24/1989    Last attempt to quit: 08/24/2021    Years since quitting: 2.2    Passive exposure: Past   Smokeless tobacco: Former  Tobacco comments:    Quit smoking previously February 2023  Vaping Use   Vaping status: Never Used  Substance Use Topics   Alcohol  use: No   Drug use: No    ROS REFER TO HPI FOR PERTINENT POSITIVES AND NEGATIVES   Objective:   Vitals: BP (!) 172/101 (BP Location: Left Arm)   Pulse 88   Temp 97.9 F (36.6 C) (Oral)   Resp 20   SpO2 98%   Physical Exam Vitals and nursing note reviewed.  Constitutional:      Appearance: Normal appearance.  HENT:     Right Ear: Hearing, tympanic membrane and external ear normal.     Left Ear: Hearing, tympanic membrane and external ear normal.     Ears:     Comments: R ear canal abrasion with minor bleeding noted 6 o'clock  Cardiovascular:     Rate and Rhythm: Normal rate and regular rhythm.  Pulmonary:     Effort: Pulmonary effort is normal.     Breath sounds: Normal breath sounds.  Neurological:     General: No focal deficit present.     Mental Status: He is alert and oriented to person, place, and time.  Psychiatric:        Mood and Affect: Mood normal.     No results found for this or any previous visit (from the past 24 hours).  Assessment and Plan :   PDMP not reviewed this encounter.  1. Abrasion of right ear canal, initial encounter   2. Primary hypertension    Abrasion of right ear canal.  TM appears to be intact.  No evidence of rupture in the eardrum.  Reassured patient this should heal on its own with time.  He can use Cortisporin eardrops to help with prevention of infection in the ear canal.  He is advised to not use Q-tips.  He needs  to follow-up with his primary care later this week.  Blood pressure also noted to be elevated.  He has a history of hypertension.  He takes amlodipine  10 mg every day and carvedilol  25 mg twice daily.  He also takes hydralazine  50 mg 3 times daily.  He is asymptomatic.  He needs to monitor his blood pressure closely and follow-up with his specialists.  Patient understanding and agreeable plan.    AllwardtDeleta Felix, PA-C 12/04/23 1941

## 2023-12-04 NOTE — ED Notes (Signed)
 Was eating potato chips in lobby

## 2023-12-04 NOTE — Discharge Instructions (Addendum)
 Good to meet you today.  You have an abrasion of your right ear canal.  Good news is that the eardrum itself appears to be intact and I do not see any evidence of a rupture or hole in the eardrum. Please use the Cortisporin eardrops to help prevent  infection in the ear canal.  Do not use any Q-tips.  If any pain or loss of hearing, follow-up with your primary care and you may need to see ear nose and throat specialist.  I suspect this will resolve on its own within the next few days.  Please take your blood pressure medication and monitor your readings at home. Follow up with your specialists.

## 2023-12-05 ENCOUNTER — Ambulatory Visit (HOSPITAL_BASED_OUTPATIENT_CLINIC_OR_DEPARTMENT_OTHER): Admitting: General Surgery

## 2023-12-21 ENCOUNTER — Ambulatory Visit: Admitting: Gastroenterology

## 2023-12-21 ENCOUNTER — Other Ambulatory Visit (INDEPENDENT_AMBULATORY_CARE_PROVIDER_SITE_OTHER)

## 2023-12-21 ENCOUNTER — Encounter: Payer: Self-pay | Admitting: Gastroenterology

## 2023-12-21 VITALS — BP 132/70 | HR 70 | Ht 70.0 in

## 2023-12-21 DIAGNOSIS — Z992 Dependence on renal dialysis: Secondary | ICD-10-CM

## 2023-12-21 DIAGNOSIS — N186 End stage renal disease: Secondary | ICD-10-CM

## 2023-12-21 DIAGNOSIS — R7989 Other specified abnormal findings of blood chemistry: Secondary | ICD-10-CM

## 2023-12-21 DIAGNOSIS — R945 Abnormal results of liver function studies: Secondary | ICD-10-CM | POA: Diagnosis not present

## 2023-12-21 DIAGNOSIS — R748 Abnormal levels of other serum enzymes: Secondary | ICD-10-CM | POA: Diagnosis not present

## 2023-12-21 DIAGNOSIS — D649 Anemia, unspecified: Secondary | ICD-10-CM | POA: Diagnosis not present

## 2023-12-21 LAB — CBC WITH DIFFERENTIAL/PLATELET
Basophils Absolute: 0 10*3/uL (ref 0.0–0.1)
Basophils Relative: 0.9 % (ref 0.0–3.0)
Eosinophils Absolute: 0.1 10*3/uL (ref 0.0–0.7)
Eosinophils Relative: 2.3 % (ref 0.0–5.0)
HCT: 33.7 % — ABNORMAL LOW (ref 39.0–52.0)
Hemoglobin: 10.7 g/dL — ABNORMAL LOW (ref 13.0–17.0)
Lymphocytes Relative: 15.7 % (ref 12.0–46.0)
Lymphs Abs: 0.5 10*3/uL — ABNORMAL LOW (ref 0.7–4.0)
MCHC: 31.8 g/dL (ref 30.0–36.0)
MCV: 89.7 fl (ref 78.0–100.0)
Monocytes Absolute: 0.4 10*3/uL (ref 0.1–1.0)
Monocytes Relative: 11.7 % (ref 3.0–12.0)
Neutro Abs: 2.4 10*3/uL (ref 1.4–7.7)
Neutrophils Relative %: 69.4 % (ref 43.0–77.0)
Platelets: 102 10*3/uL — ABNORMAL LOW (ref 150.0–400.0)
RBC: 3.76 Mil/uL — ABNORMAL LOW (ref 4.22–5.81)
RDW: 19.4 % — ABNORMAL HIGH (ref 11.5–15.5)
WBC: 3.4 10*3/uL — ABNORMAL LOW (ref 4.0–10.5)

## 2023-12-21 LAB — IBC + FERRITIN
Ferritin: 383.3 ng/mL — ABNORMAL HIGH (ref 22.0–322.0)
Iron: 48 ug/dL (ref 42–165)
Saturation Ratios: 20.1 % (ref 20.0–50.0)
TIBC: 239.4 ug/dL — ABNORMAL LOW (ref 250.0–450.0)
Transferrin: 171 mg/dL — ABNORMAL LOW (ref 212.0–360.0)

## 2023-12-21 LAB — B12 AND FOLATE PANEL
Folate: 10.4 ng/mL (ref >5.9)
Vitamin B-12: 508 pg/mL (ref 211–911)

## 2023-12-21 LAB — COMPREHENSIVE METABOLIC PANEL WITH GFR
ALT: 14 U/L (ref 0–53)
AST: 17 U/L (ref 0–37)
Albumin: 3.2 g/dL — ABNORMAL LOW (ref 3.5–5.2)
Alkaline Phosphatase: 276 U/L — ABNORMAL HIGH (ref 39–117)
BUN: 41 mg/dL — ABNORMAL HIGH (ref 6–23)
CO2: 31 meq/L (ref 19–32)
Calcium: 8.3 mg/dL — ABNORMAL LOW (ref 8.4–10.5)
Chloride: 95 meq/L — ABNORMAL LOW (ref 96–112)
Creatinine, Ser: 4.19 mg/dL — ABNORMAL HIGH (ref 0.40–1.50)
GFR: 15.64 mL/min — ABNORMAL LOW (ref >60.00)
Glucose, Bld: 141 mg/dL — ABNORMAL HIGH (ref 70–99)
Potassium: 3.7 meq/L (ref 3.5–5.1)
Sodium: 136 meq/L (ref 135–145)
Total Bilirubin: 1.2 mg/dL (ref 0.2–1.2)
Total Protein: 7.6 g/dL (ref 6.0–8.3)

## 2023-12-21 LAB — PROTIME-INR
INR: 1.6 ratio — ABNORMAL HIGH (ref 0.8–1.0)
Prothrombin Time: 17.1 s — ABNORMAL HIGH (ref 9.6–13.1)

## 2023-12-21 NOTE — Progress Notes (Addendum)
 12/21/2023 Todd Mccoy [id number] 1970/12/11   HISTORY OF PRESENT ILLNESS: This is a 53 year old male with multiple medical problems.  He has end-stage renal disease on dialysis and since August 2024 that he receives on Tuesdays/Thursdays/Saturdays, he has diabetic retinopathy with blindness, diabetic neuropathy with history of left BKA, hypertension, chronic combined systolic and diastolic CHF with an EF of 45 to 50%.  He is here today with his niece.  Patient is an extremely poor historian, was sleeping in the wheelchair when I entered the room.  Niece did not contribute to any history at all.  He was not even sure why he was here today.  Looks like he was referred by his PCP for an elevated alk phos level.  Has been elevated on recent labs.  When last checked in March, alk phos was 237, it looks like an early March it was 339.  Other LFTs been normal except for a one-time reading of total bili at 1.7.  Patient does not use alcohol .  GGT was elevated at 127.  Patient had EGD and colonoscopy in November 2021.  Colonoscopy was poor prep and it was recommended that he have a repeat in 3 months.  Never followed up after that.  Patient denies any black or bloody stools.  Says that his bowel movements vary, sometimes loose stools.  Little bit of abdominal pain but not much.  Has pain in his foot/leg.    He has had three paracenteses that have been performed over the last couple of months for ascites, ordered by his nephrologist.   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/2020   pt states recent MD visit /left eye worse than right   Dyslipidemia 12/10/2020   Hx of BKA, left (HCC)    Hypertension    Morbid obesity (HCC)    Osteomyelitis (HCC)    Pneumonia    Pulmonary HTN (HCC)    mild to moderate (PASP68mmHg) by echo 07/2023   Past Surgical History:  Procedure Laterality Date   ABSCESS DRAINAGE     neck   AMPUTATION Left 11/14/2020   Procedure: LEFT 5TH RAY AMPUTATION;  Surgeon: Timothy Ford, MD;  Location: [facility] OR;  Service: Orthopedics;  Laterality: Left;   AMPUTATION Left 12/10/2020   Procedure: LEFT BELOW KNEE AMPUTATION;  Surgeon: Timothy Ford, MD;  Location: [facility] OR;  Service: Orthopedics;  Laterality: Left;   AMPUTATION TOE Right 03/25/2020   Procedure: AMPUTATION TOE;  Surgeon: Camilo Cella, DPM;  Location: [facility];  Service: Podiatry;  Laterality: Right;   AV FISTULA PLACEMENT Right 05/23/2023   Procedure: RIGHT ARM Brachiocephalic ARTERIOVENOUS (AV) FISTULA CREATION;  Surgeon: Kayla Part, MD;  Location: [facility] OR;  Service: Vascular;  Laterality: Right;   INCISION AND DRAINAGE ABSCESS Right 09/07/2014   Procedure: INCISION AND DRAINAGE ABSCESS RIGHT FLANK;  Surgeon: Adalberto Hollow, MD;  Location: [facility];  Service: General;  Laterality: Right;  IR FLUORO GUIDE CV LINE RIGHT  02/11/2023   IR PARACENTESIS  10/03/2023   IR PARACENTESIS  10/19/2023   IR PARACENTESIS  11/07/2023   IR PARACENTESIS  11/30/2023   IR REMOVAL TUN CV CATH W/O FL  11/30/2023   IR US  GUIDE VASC ACCESS RIGHT  02/11/2023   LEG AMPUTATION BELOW KNEE Left 12/10/2020   SCROTUM EXPLORATION      reports that he has been smoking cigarettes. He started smoking about 34 years ago. He has a 16 pack-year smoking history. He has been exposed to tobacco smoke. He has quit using smokeless tobacco. He reports that he does not drink alcohol  and does not use drugs. family history includes Diabetes in his brother, father, and  mother. No Known Allergies    Outpatient Encounter Medications as of 12/21/2023  Medication Sig   amLODipine  (NORVASC ) 10 MG tablet Take 1 tablet (10 mg total) by mouth daily.   atorvastatin  (LIPITOR) 20 MG tablet TAKE 1 TABLET (20 MG TOTAL) BY MOUTH DAILY.   carvedilol  (COREG ) 25 MG tablet TAKE 1 TABLET BY MOUTH TWICE DAILY WITH A MEAL . APPOINTMENT REQUIRED FOR FUTURE REFILLS   doxycycline  (VIBRAMYCIN ) 100 MG capsule Take 1 capsule (100 mg total) by mouth 2 (two) times daily.   ferrous sulfate  325 (65 FE) MG EC tablet Take 1 tablet (325 mg total) by mouth daily with breakfast.   furosemide  (LASIX ) 40 MG tablet Take 1 tablet (40 mg total) by mouth 2 (two) times daily.   gabapentin  (NEURONTIN ) 300 MG capsule Take 2 capsules (600 mg total) by mouth 2 (two) times daily.   gentamicin ointment (GARAMYCIN) 0.1 % SMARTSIG:sparingly Topical   hydrALAZINE  (APRESOLINE ) 50 MG tablet Take 50 mg by mouth 3 (three) times daily.   hydrOXYzine  (ATARAX ) 50 MG tablet Take 1 tablet (50 mg total) by mouth 3 (three) times daily as needed for anxiety or itching.   insulin  glargine (LANTUS ) 100 UNIT/ML injection Inject 0.05 mLs (5 Units total) into the skin at bedtime.   isosorbide  mononitrate (IMDUR ) 60 MG 24 hr tablet Take 1 tablet (60 mg total) by mouth daily.   minocycline (MINOCIN) 100 MG capsule Take 100 mg by mouth 2 (two) times daily.   nortriptyline  (PAMELOR ) 25 MG capsule Take 2 capsules (50 mg total) by mouth at bedtime. Start with 1 capsule at nighttime for 1 week, then if no side effects increase to 2 capsules at nighttime   oxyCODONE -acetaminophen  (PERCOCET/ROXICET) 5-325 MG tablet Take 1 tablet by mouth every 6 (six) hours as needed for severe pain (pain score 7-10).   polyethylene glycol (MIRALAX  / GLYCOLAX ) 17 g packet Take 17 g by mouth daily as needed for moderate constipation.   torsemide  (DEMADEX ) 20 MG tablet Take 4 tablets (80 mg total) by mouth daily.   calcitRIOL  (ROCALTROL ) 0.25 MCG capsule  Take 1 capsule (0.25 mcg total) by mouth 2 (two) times daily. (Patient not taking: Reported on 12/21/2023)   docusate sodium  (COLACE) 100 MG capsule Take 1 capsule (100 mg total) by mouth 2 (two) times daily as needed for mild constipation. (Patient not taking: Reported on 12/21/2023)   levofloxacin (LEVAQUIN) 250 MG tablet Take by mouth. (Patient not taking: Reported on 12/21/2023)   magnesium  oxide (MAG-OX) 400 (240 Mg) MG tablet Take 1 tablet (400 mg total) by mouth 2 (two) times daily. (Patient not taking: Reported on 12/21/2023)   multivitamin (RENA-VIT) TABS tablet Take 1 tablet by mouth at bedtime. (Patient not taking: Reported on 12/21/2023)  No facility-administered encounter medications on file as of 12/21/2023.    REVIEW OF SYSTEMS  : All other systems reviewed and negative except where noted in the History of Present Illness.   PHYSICAL EXAM: BP 132/70 (BP Location: Left Arm, Patient Position: Sitting, Cuff Size: Normal)   Pulse 70   Ht 5' 10 (1.778 m) Comment: Pt stated  BMI 27.52 kg/m  General: Well developed AA male in no acute distress; in wheelchair Head: Normocephalic and atraumatic Eyes:  Sclerae anicteric, conjunctiva pink. Ears: Normal auditory acuity Lungs: Clear throughout to auscultation; no W/R/R. Heart: Regular rate and rhythm; no M/R/G. Skin: No lesions on visible extremities Extremities:  Left BKA. Neurological: Alert oriented x 4, grossly nonfocal Psychological:  Alert and cooperative. Normal mood and affect  ASSESSMENT AND PLAN: *Elevated LFTs, primarily alk phos, did have a bilirubin of 1.7 on labs were last checked.  Other LFTs have been normal.  GGT elevated at 127. *End-stage renal disease on dialysis since August 2024 *Left BKA *Blindness from diabetic retinopathy *Normocytic anemia: Last hemoglobin a couple months ago was 9.9 g.  MCV is normal at 93.8.  Suspect likely a component of anemia of chronic disease from his chronic kidney disease.  No overt  sign of GI bleeding.  Had colonoscopy with poor prep in November 2021 with repeat recommended in 3 months, but he never proceeded.  -Will check a right upper quadrant abdominal ultrasound. - Will repeat LFTs, check a PT/INR, and will perform the other extensive serologic evaluation to rule out causes of chronic liver disease. - Will repeat a CBC today as well as iron  studies, B12, and folate levels.   CC:  Hassie Lint, PA-C  Primary gastroenterologist:  Patient with multiple significant medical problems and a clinical scenario that is suspicious for cirrhosis with decompensation manifested by ascites.  This can be particularly problematic in patients with end-stage renal disease on dialysis i.e. very difficult to control and typically requiring regular paracenteses.  In March paracentesis was performed and albumin  on the fluid was 1.7 and a serum albumin  in the vicinity of that day was 3 so SAAG would be 1.3, which is greater than 1.1 and consistent with oral hypertension.  He has good cardiac function on echocardiogram and there is no right heart failure.  We will see what this evaluation ordered by Ms. Saadiq Poche shows and determine next steps.  It sounds like repeating a colonoscopy would be challenging to say the least.  Procedures need to be done at the hospital.   Carvedilol  is on his med list and if he is taking that that would remove the need for an EGD to look for varices.  Kenney Peacemaker, MD, [facility]

## 2023-12-21 NOTE — Patient Instructions (Signed)
 Your provider has requested that you go to the basement level for lab work before leaving today. Press B on the elevator. The lab is located at the first door on the left as you exit the elevator.  You have been scheduled for an abdominal ultrasound at [facility] (1st floor of hospital) on Wednesday 12/28/23 at 8 am. Please arrive 30 minutes prior to your appointment for registration. Make certain not to have anything to eat or drink 6 hours prior to your appointment. Should you need to reschedule your appointment, please contact radiology at [phone]. This test typically takes about 30 minutes to perform.  _______________________________________________________  If your blood pressure at your visit was 140/90 or greater, please contact your primary care physician to follow up on this.  _______________________________________________________  If you are age 55 or older, your body mass index should be between 23-30. Your Body mass index is 27.52 kg/m. If this is out of the aforementioned range listed, please consider follow up with your Primary Care Provider.  If you are age 70 or younger, your body mass index should be between 19-25. Your Body mass index is 27.52 kg/m. If this is out of the aformentioned range listed, please consider follow up with your Primary Care Provider.   ________________________________________________________  The [facility] providers would like to encourage you to use MYCHART to communicate with providers for non-urgent requests or questions.  Due to long hold times on the telephone, sending your provider a message by [facility] may be a faster and more efficient way to get a response.  Please allow 48 business hours for a response.  Please remember that this is for non-urgent requests.  _______________________________________________________

## 2023-12-22 ENCOUNTER — Ambulatory Visit (HOSPITAL_COMMUNITY)
Admission: EM | Admit: 2023-12-22 | Discharge: 2023-12-22 | Disposition: A | Discharge status: Home / Self Care | Attending: Emergency Medicine | Admitting: Emergency Medicine

## 2023-12-22 ENCOUNTER — Encounter (HOSPITAL_COMMUNITY): Payer: Self-pay | Admitting: Emergency Medicine

## 2023-12-22 ENCOUNTER — Other Ambulatory Visit: Payer: Self-pay

## 2023-12-22 DIAGNOSIS — R0781 Pleurodynia: Secondary | ICD-10-CM | POA: Diagnosis not present

## 2023-12-22 DIAGNOSIS — R188 Other ascites: Secondary | ICD-10-CM | POA: Diagnosis not present

## 2023-12-22 MED ORDER — BACLOFEN 5 MG PO TABS: 5.0000 mg | ORAL_TABLET | Freq: Two times a day (BID) | ORAL | 0 refills | Status: DC | PRN

## 2023-12-22 NOTE — Discharge Instructions (Addendum)
 You can try the muscle relaxer twice daily (Baclofen) If this makes you drowsy please be cautious. Apply ice to the ribs to reduce pain  Call your primary care provider TODAY to talk about follow up  Please go to the emergency department if symptoms worsen or if you develop abdominal pain.

## 2023-12-22 NOTE — ED Provider Notes (Signed)
 [facility]    CSN: [id number] Arrival date & time: 12/22/23  1240      History   Chief Complaint Chief Complaint  Patient presents with   Fall    HPI Todd Mccoy is a 53 y.o. male.  Patient is a poor historian with a very complex past medical history.  His niece is present but does not provide any history whatsoever.  It seems he may have fell this morning and hit his elbow into his left rib cage.  Since then having some discomfort.  He denies any head injury or loss of consciousness. Points to area of left upper abdomen as pain.  Patient has large abdomen, ascites.  He has stage IV CKD, on dialysis 3 times a week but missed it today due to his fall.  He has ascites from his kidney failure per nephrology.  Reports he has to have the belly tapped frequently to remove fluid.  Last was 3 weeks ago on 11/30/2023 (had 4 Liters removed). Additional history of heart failure, diabetes, hypertension. Not compliant with medications.   He is currently denying any abdominal pain. No vomiting or nausea. No fever. Taking tylenol  for pain  Saw gastro yesterday for elevated alkaline phosphate, which has been elevated in the past. RUQ ultrasound was ordered, alk phos was 276 (in march was 339). Gastro concerned for cirrhosis U/S will be done in 6 days.  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/2020   pt states recent MD visit /left eye worse than right   Dyslipidemia 12/10/2020   Hx of BKA,  left (HCC)    Hypertension    Morbid obesity (HCC)    Osteomyelitis (HCC)    Pneumonia    Pulmonary HTN (HCC)    mild to moderate (PASP65mmHg) by echo 07/2023    Patient Active Problem List   Diagnosis Date Noted   Elevated alkaline phosphatase level 12/21/2023   Elevated LFTs 12/21/2023   Skin ulcer of right great toe, limited to breakdown of skin (HCC) 10/07/2023   Diabetic polyneuropathy associated with diabetes mellitus due to underlying condition (HCC) 10/05/2023   Other insomnia 10/05/2023   Bilateral carpal tunnel syndrome 10/05/2023   Cubital tunnel syndrome, bilateral 10/05/2023   Chronic pain syndrome 10/05/2023   Encounter for pain management 10/05/2023   Pulmonary HTN (HCC)    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/24/2023   End stage renal disease (HCC) 02/19/2023   Acquired absence of left leg below knee (HCC) 02/14/2023   Acquired absence of other right toe(s) (HCC) 02/14/2023   Dependence on renal dialysis (HCC) 02/14/2023   Hyperlipidemia, unspecified 02/14/2023   Hypertensive heart and chronic kidney disease with heart failure and with stage 5 chronic kidney disease, or end stage renal disease (HCC) 02/14/2023   Nicotine dependence, cigarettes, uncomplicated 02/14/2023   Type 2 diabetes mellitus with diabetic neuropathy, unspecified (HCC) 02/14/2023   Type 2 diabetes mellitus with diabetic peripheral  angiopathy without gangrene (HCC) 02/14/2023   Type 2 diabetes mellitus with unspecified diabetic retinopathy without macular edema (HCC) 02/14/2023   Diarrhea of presumed infectious origin 02/08/2023   Metabolic acidosis 02/08/2023   Anemia of chronic disease 02/08/2023   Acute renal failure superimposed on chronic kidney disease (HCC) 02/08/2023   Encephalopathy acute 02/07/2023   Shock circulatory (HCC) 02/05/2023   PAD (peripheral artery disease) (HCC) 10/27/2022   Renal insufficiency    Acute on chronic systolic CHF (congestive heart failure)  (HCC) 01/23/2022   Prolonged QT interval 01/23/2022   Chest pain 01/23/2022   Elevated troponin 01/23/2022   Anemia due to chronic kidney disease 01/23/2022   Hypertensive emergency 01/23/2022   Hyponatremia 01/23/2022   Chronic combined systolic (congestive) and diastolic (congestive) heart failure (HCC) 04/07/2021   Chronic renal disease, stage 4, severely decreased glomerular filtration rate between 15-29 mL/min/1.73 square meter (HCC)    Hypertensive urgency    Acute kidney injury superimposed on chronic kidney disease (HCC)    Acute CHF (congestive heart failure) (HCC) 02/27/2021   Normocytic anemia 02/27/2021   Anasarca 02/27/2021   Abscess of left foot 12/10/2020   S/P BKA (below knee amputation) unilateral, left (HCC) 12/10/2020   Dyslipidemia 12/10/2020   CKD (chronic kidney disease), stage III (HCC) 12/10/2020   HTN (hypertension) 11/12/2020   Cellulitis and abscess of foot    Diabetes mellitus with hyperglycemia (HCC) 09/07/2014   Tobacco abuse 09/07/2014   Abscess of back    Abscess of lower back 09/06/2014   DM2 (diabetes mellitus, type 2) (HCC) 09/06/2014   Sepsis (HCC) 09/06/2014   Scrotal abscess 06/20/2014    Past Surgical History:  Procedure Laterality Date   ABSCESS DRAINAGE     neck   AMPUTATION Left 11/14/2020   Procedure: LEFT 5TH RAY AMPUTATION;  Surgeon: Timothy Ford, MD;  Location: [facility] OR;  Service: Orthopedics;  Laterality: Left;   AMPUTATION Left 12/10/2020   Procedure: LEFT BELOW KNEE AMPUTATION;  Surgeon: Timothy Ford, MD;  Location: [facility];  Service: Orthopedics;  Laterality: Left;   AMPUTATION TOE Right 03/25/2020   Procedure: AMPUTATION TOE;  Surgeon: Camilo Cella, DPM;  Location: [facility];  Service: Podiatry;  Laterality: Right;   AV FISTULA PLACEMENT Right 05/23/2023   Procedure: RIGHT ARM Brachiocephalic ARTERIOVENOUS (AV) FISTULA CREATION;  Surgeon: Kayla Part, MD;  Location: [facility] OR;  Service: Vascular;  Laterality: Right;   INCISION  AND DRAINAGE ABSCESS Right 09/07/2014   Procedure: INCISION AND DRAINAGE ABSCESS RIGHT FLANK;  Surgeon: Adalberto Hollow, MD;  Location: [facility];  Service: General;  Laterality: Right;   IR FLUORO GUIDE CV LINE RIGHT  02/11/2023   IR PARACENTESIS  10/03/2023   IR PARACENTESIS  10/19/2023   IR PARACENTESIS  11/07/2023   IR PARACENTESIS  11/30/2023   IR REMOVAL TUN CV CATH W/O FL  11/30/2023   IR US  GUIDE VASC ACCESS RIGHT  02/11/2023   LEG AMPUTATION BELOW KNEE Left 12/10/2020   SCROTUM EXPLORATION         Home Medications    Prior to Admission medications   Medication Sig Start Date End Date Taking? Authorizing Provider  Baclofen 5 MG TABS Take 1 tablet (5 mg total) by mouth 2 (two) times daily as needed. 12/22/23  Yes Katarzyna Wolven, Ivette Marks, PA-C  amLODipine  (NORVASC ) 10 MG tablet Take 1 tablet (10 mg total) by mouth daily. 06/20/23   [name], [name], MD  atorvastatin  (LIPITOR) 20 MG tablet TAKE 1 TABLET (20 MG  TOTAL) BY MOUTH DAILY. 05/16/23   Gerald Kitty., NP  calcitRIOL  (ROCALTROL ) 0.25 MCG capsule Take 1 capsule (0.25 mcg total) by mouth 2 (two) times daily. Patient not taking: Reported on 12/21/2023 07/28/23   Jacqueline Matsu, MD  carvedilol  (COREG ) 25 MG tablet TAKE 1 TABLET BY MOUTH TWICE DAILY WITH A MEAL . APPOINTMENT REQUIRED FOR FUTURE REFILLS 09/07/23   Jacqueline Matsu, MD  docusate sodium  (COLACE) 100 MG capsule Take 1 capsule (100 mg total) by mouth 2 (two) times daily as needed for mild constipation. Patient not taking: Reported on 12/21/2023 02/14/23   Haydee Lipa, MD  doxycycline  (VIBRAMYCIN ) 100 MG capsule Take 1 capsule (100 mg total) by mouth 2 (two) times daily. 11/16/23   Reuben Castilla, NP  ferrous sulfate  325 (65 FE) MG EC tablet Take 1 tablet (325 mg total) by mouth daily with breakfast. 08/31/23   Hassie Lint, PA-C  furosemide  (LASIX ) 40 MG tablet Take 1 tablet (40 mg total) by mouth 2 (two) times daily. 08/31/23   Hassie Lint, PA-C  gabapentin  (NEURONTIN ) 300 MG  capsule Take 2 capsules (600 mg total) by mouth 2 (two) times daily. 08/31/23   Hassie Lint, PA-C  gentamicin ointment (GARAMYCIN) 0.1 % SMARTSIG:sparingly Topical 11/23/23   [provider]  hydrALAZINE  (APRESOLINE ) 50 MG tablet Take 50 mg by mouth 3 (three) times daily. 02/19/23   [provider]  hydrOXYzine  (ATARAX ) 50 MG tablet Take 1 tablet (50 mg total) by mouth 3 (three) times daily as needed for anxiety or itching. 08/31/23   McClung, Angela M, PA-C  insulin  glargine (LANTUS ) 100 UNIT/ML injection Inject 0.05 mLs (5 Units total) into the skin at bedtime. 08/31/23   Hassie Lint, PA-C  isosorbide  mononitrate (IMDUR ) 60 MG 24 hr tablet Take 1 tablet (60 mg total) by mouth daily. 08/31/23   Hassie Lint, PA-C  levofloxacin (LEVAQUIN) 250 MG tablet Take by mouth. Patient not taking: Reported on 12/21/2023 11/23/23   [provider]  magnesium  oxide (MAG-OX) 400 (240 Mg) MG tablet Take 1 tablet (400 mg total) by mouth 2 (two) times daily. Patient not taking: Reported on 12/21/2023 02/14/23   Haydee Lipa, MD  minocycline (MINOCIN) 100 MG capsule Take 100 mg by mouth 2 (two) times daily. 10/29/23   [provider]  multivitamin (RENA-VIT) TABS tablet Take 1 tablet by mouth at bedtime. Patient not taking: Reported on 12/21/2023 02/14/23   Haydee Lipa, MD  nortriptyline  (PAMELOR ) 25 MG capsule Take 2 capsules (50 mg total) by mouth at bedtime. Start with 1 capsule at nighttime for 1 week, then if no side effects increase to 2 capsules at nighttime 10/05/23 02/02/24  Engler, Morgan C, DO  polyethylene glycol (MIRALAX  / GLYCOLAX ) 17 g packet Take 17 g by mouth daily as needed for moderate constipation. 02/14/23   Haydee Lipa, MD  torsemide  (DEMADEX ) 20 MG tablet Take 4 tablets (80 mg total) by mouth daily. 05/16/23   Gerald Kitty., NP    Family History Family History  Problem Relation Age of Onset   Diabetes Mother    Diabetes  Father    Diabetes Brother    Colon cancer Neg Hx    Esophageal cancer Neg Hx    Pancreatic cancer Neg Hx    Stomach cancer Neg Hx    Liver disease Neg Hx    CAD Neg Hx     Social History Social History  Tobacco Use   Smoking status: Every Day    Current packs/day: 0.00    Average packs/day: 0.5 packs/day for 32.0 years (16.0 ttl pk-yrs)    Types: Cigarettes    Start date: 08/24/1989    Last attempt to quit: 08/24/2021    Years since quitting: 2.3    Passive exposure: Past   Smokeless tobacco: Former   Tobacco comments:    Quit smoking previously February 2023  Vaping Use   Vaping status: Never Used  Substance Use Topics   Alcohol  use: No   Drug use: No     Allergies   Patient has no known allergies.   Review of Systems Review of Systems  As per HPI  Physical Exam Triage Vital Signs ED Triage Vitals  Encounter Vitals Group     BP 12/22/23 1321 (!) 177/102     Girls Systolic BP Percentile --      Girls Diastolic BP Percentile --      Boys Systolic BP Percentile --      Boys Diastolic BP Percentile --      Pulse Rate 12/22/23 1321 79     Resp 12/22/23 1321 (!) 24     Temp 12/22/23 1321 98.1 F (36.7 C)     Temp Source 12/22/23 1321 Oral     SpO2 12/22/23 1321 96 %     Weight --      Height --      Head Circumference --      Peak Flow --      Pain Score 12/22/23 1318 8     Pain Loc --      Pain Education --      Exclude from Growth Chart --    No data found.  Updated Vital Signs BP (!) 164/95 (BP Location: Left Arm)   Pulse 76   Temp 98.1 F (36.7 C) (Oral)   Resp 20   SpO2 94%     Physical Exam Vitals and nursing note reviewed.  Constitutional:      General: He is not in acute distress.    Appearance: He is ill-appearing (chronically). He is not diaphoretic.     Comments: Patient in wheelchair. Left BKA  HENT:     Mouth/Throat:     Mouth: Mucous membranes are moist.     Pharynx: Oropharynx is clear.   Eyes:     Conjunctiva/sclera:  Conjunctivae normal.    Cardiovascular:     Rate and Rhythm: Normal rate and regular rhythm.     Pulses: Normal pulses.     Heart sounds: Normal heart sounds.     Comments: 2+ edema in the right lower extremity not pitting. Left BKA. Pulmonary:     Effort: Pulmonary effort is normal.     Breath sounds: Normal breath sounds.     Comments: Lung sounds auscultated throughout. Able to take deep breath without pain. Abdominal:     General: There is distension.     Palpations: There is no mass.     Tenderness: There is abdominal tenderness in the left upper quadrant. There is no left CVA tenderness.     Comments: Tender left upper quadrant. Abdomen is distended and firm. There is no bony tenderness of the ribs palpated in this area. Exam limited by distension. He denies pain with palpation over rest of the belly.    Musculoskeletal:     Right lower leg: 2+ Edema present.     Left lower leg: 2+ Edema present.  Amputation Left Lower Extremity: Left leg is amputated below knee.   Skin:    General: Skin is warm and dry.   Neurological:     Mental Status: He is alert. Mental status is at baseline.      UC Treatments / Results  Labs (all labs ordered are listed, but only abnormal results are displayed) Labs Reviewed - No data to display  EKG   Radiology No results found.  Procedures Procedures (including critical care time)  Medications Ordered in UC Medications - No data to display  Initial Impression / Assessment and Plan / UC Course  I have reviewed the triage vital signs and the nursing notes.  Pertinent labs & imaging results that were available during my care of the patient were reviewed by me and considered in my medical decision making (see chart for details).  Elevated BP - has not taken medications today. Slight improved on recheck 164/95 Complex medical history He is minimally tender over the left upper abdomen which is distended with fluid. No bony  tenderness of the ribs. I have offered rib x-ray, however we discussed that any fracture would be treated with symptomatic care.  Patient declines x-ray imaging at this time.  He was requesting narcotic medication for pain.  Discussed urgent care does not treat acute pain with narcotic medications.  Unfortunately he cannot use NSAIDs and I do not recommend Tylenol  given his liver and kidney concerns.  Can try baclofen and ice to the area.  I did discuss with the patient how imperative it is to monitor symptoms and present directly to the emergency department if abdominal pain becomes severe or worsens.  He is not willing to be seen in the emergency department at this time despite the fluid on his belly.  Reports he is not in the pain he usually has.  His niece is bedside and verbalizes she will bring him to the ED with worsening symptoms. Again verbalized strict precations  Final Clinical Impressions(s) / UC Diagnoses   Final diagnoses:  Rib pain on left side  Other ascites     Discharge Instructions      You can try the muscle relaxer twice daily (Baclofen) If this makes you drowsy please be cautious. Apply ice to the ribs to reduce pain  Call your primary care provider TODAY to talk about follow up  Please go to the emergency department if symptoms worsen or if you develop abdominal pain.    ED Prescriptions     Medication Sig Dispense Auth. Provider   Baclofen 5 MG TABS Take 1 tablet (5 mg total) by mouth 2 (two) times daily as needed. 20 tablet [name], Ivette Marks, PA-C      PDMP not reviewed this encounter.   Newton Barer 12/22/23 1839

## 2023-12-22 NOTE — ED Triage Notes (Signed)
 Patient reports he fell this morning, left ribcage pain .  No loc, no head contact

## 2023-12-23 ENCOUNTER — Ambulatory Visit (HOSPITAL_COMMUNITY)
Admission: RE | Admit: 2023-12-23 | Discharge: 2023-12-23 | Discharge status: Against Medical Advice | Source: Ambulatory Visit

## 2023-12-23 ENCOUNTER — Ambulatory Visit (HOSPITAL_COMMUNITY)

## 2023-12-23 ENCOUNTER — Other Ambulatory Visit (HOSPITAL_COMMUNITY): Payer: Self-pay | Admitting: Nephrology

## 2023-12-23 ENCOUNTER — Encounter (HOSPITAL_COMMUNITY): Payer: Self-pay

## 2023-12-23 VITALS — BP 186/111 | HR 73 | Temp 98.0°F | Resp 12

## 2023-12-23 DIAGNOSIS — S2231XA Fracture of one rib, right side, initial encounter for closed fracture: Secondary | ICD-10-CM | POA: Insufficient documentation

## 2023-12-23 DIAGNOSIS — Z794 Long term (current) use of insulin: Secondary | ICD-10-CM | POA: Insufficient documentation

## 2023-12-23 DIAGNOSIS — E1122 Type 2 diabetes mellitus with diabetic chronic kidney disease: Secondary | ICD-10-CM | POA: Insufficient documentation

## 2023-12-23 DIAGNOSIS — R4182 Altered mental status, unspecified: Secondary | ICD-10-CM | POA: Diagnosis present

## 2023-12-23 DIAGNOSIS — E875 Hyperkalemia: Secondary | ICD-10-CM | POA: Insufficient documentation

## 2023-12-23 DIAGNOSIS — I132 Hypertensive heart and chronic kidney disease with heart failure and with stage 5 chronic kidney disease, or end stage renal disease: Secondary | ICD-10-CM | POA: Insufficient documentation

## 2023-12-23 DIAGNOSIS — E669 Obesity, unspecified: Secondary | ICD-10-CM | POA: Diagnosis not present

## 2023-12-23 DIAGNOSIS — W19XXXA Unspecified fall, initial encounter: Secondary | ICD-10-CM | POA: Diagnosis not present

## 2023-12-23 DIAGNOSIS — Z79899 Other long term (current) drug therapy: Secondary | ICD-10-CM | POA: Diagnosis not present

## 2023-12-23 DIAGNOSIS — R0781 Pleurodynia: Secondary | ICD-10-CM | POA: Diagnosis not present

## 2023-12-23 DIAGNOSIS — E7220 Disorder of urea cycle metabolism, unspecified: Secondary | ICD-10-CM | POA: Diagnosis not present

## 2023-12-23 DIAGNOSIS — I509 Heart failure, unspecified: Secondary | ICD-10-CM | POA: Diagnosis not present

## 2023-12-23 DIAGNOSIS — Z992 Dependence on renal dialysis: Secondary | ICD-10-CM | POA: Diagnosis not present

## 2023-12-23 DIAGNOSIS — R188 Other ascites: Secondary | ICD-10-CM

## 2023-12-23 DIAGNOSIS — Z9181 History of falling: Secondary | ICD-10-CM | POA: Diagnosis present

## 2023-12-23 DIAGNOSIS — N186 End stage renal disease: Secondary | ICD-10-CM | POA: Insufficient documentation

## 2023-12-23 DIAGNOSIS — Z89512 Acquired absence of left leg below knee: Secondary | ICD-10-CM | POA: Diagnosis not present

## 2023-12-23 HISTORY — PX: IR PARACENTESIS: IMG2679

## 2023-12-23 LAB — I-STAT VENOUS BLOOD GAS, ED
Acid-Base Excess: 1 mmol/L (ref 0.0–2.0)
Bicarbonate: 28 mmol/L (ref 20.0–28.0)
Calcium, Ion: 0.99 mmol/L — ABNORMAL LOW (ref 1.15–1.40)
HCT: 37 % — ABNORMAL LOW (ref 39.0–52.0)
Hemoglobin: 12.6 g/dL — ABNORMAL LOW (ref 13.0–17.0)
O2 Saturation: 99 %
Potassium: 4.4 mmol/L (ref 3.5–5.1)
Sodium: 138 mmol/L (ref 135–145)
TCO2: 30 mmol/L (ref 22–32)
pCO2, Ven: 53.8 mmHg (ref 44–60)
pH, Ven: 7.324 (ref 7.25–7.43)
pO2, Ven: 150 mmHg — ABNORMAL HIGH (ref 32–45)

## 2023-12-23 LAB — CBC WITH DIFFERENTIAL/PLATELET
Abs Immature Granulocytes: 0.02 10*3/uL (ref 0.00–0.07)
Basophils Absolute: 0 10*3/uL (ref 0.0–0.1)
Basophils Relative: 0 %
Eosinophils Absolute: 0.1 10*3/uL (ref 0.0–0.5)
Eosinophils Relative: 3 %
HCT: 36.4 % — ABNORMAL LOW (ref 39.0–52.0)
Hemoglobin: 11.1 g/dL — ABNORMAL LOW (ref 13.0–17.0)
Immature Granulocytes: 1 %
Lymphocytes Relative: 21 %
Lymphs Abs: 0.8 10*3/uL (ref 0.7–4.0)
MCH: 28.6 pg (ref 26.0–34.0)
MCHC: 30.5 g/dL (ref 30.0–36.0)
MCV: 93.8 fL (ref 80.0–100.0)
Monocytes Absolute: 0.5 10*3/uL (ref 0.1–1.0)
Monocytes Relative: 14 %
Neutro Abs: 2.2 10*3/uL (ref 1.7–7.7)
Neutrophils Relative %: 61 %
Platelets: 105 10*3/uL — ABNORMAL LOW (ref 150–400)
RBC: 3.88 MIL/uL — ABNORMAL LOW (ref 4.22–5.81)
RDW: 18.7 % — ABNORMAL HIGH (ref 11.5–15.5)
WBC: 3.7 10*3/uL — ABNORMAL LOW (ref 4.0–10.5)
nRBC: 0 % (ref 0.0–0.2)

## 2023-12-23 LAB — COMPREHENSIVE METABOLIC PANEL WITH GFR
ALT: 17 U/L (ref 0–44)
AST: 31 U/L (ref 15–41)
Albumin: 2.5 g/dL — ABNORMAL LOW (ref 3.5–5.0)
Alkaline Phosphatase: 258 U/L — ABNORMAL HIGH (ref 38–126)
Anion gap: 16 — ABNORMAL HIGH (ref 5–15)
BUN: 67 mg/dL — ABNORMAL HIGH (ref 6–20)
CO2: 24 mmol/L (ref 22–32)
Calcium: 8.1 mg/dL — ABNORMAL LOW (ref 8.9–10.3)
Chloride: 96 mmol/L — ABNORMAL LOW (ref 98–111)
Creatinine, Ser: 5.71 mg/dL — ABNORMAL HIGH (ref 0.61–1.24)
GFR, Estimated: 11 mL/min — ABNORMAL LOW (ref >60)
Glucose, Bld: 172 mg/dL — ABNORMAL HIGH (ref 70–99)
Potassium: 6.1 mmol/L — ABNORMAL HIGH (ref 3.5–5.1)
Sodium: 136 mmol/L (ref 135–145)
Total Bilirubin: 1.5 mg/dL — ABNORMAL HIGH (ref 0.0–1.2)
Total Protein: 7.3 g/dL (ref 6.5–8.1)

## 2023-12-23 LAB — IGA: Immunoglobulin A: 663 mg/dL — ABNORMAL HIGH (ref 47–310)

## 2023-12-23 LAB — GLUCOSE, CAPILLARY: Glucose-Capillary: 168 mg/dL — ABNORMAL HIGH (ref 70–99)

## 2023-12-23 LAB — AMMONIA: Ammonia: 55 umol/L — ABNORMAL HIGH (ref 9–35)

## 2023-12-23 LAB — ALPHA-1-ANTITRYPSIN: A-1 Antitrypsin, Ser: 182 mg/dL (ref 83–199)

## 2023-12-23 LAB — LIPASE, BLOOD: Lipase: 46 U/L (ref 11–51)

## 2023-12-23 LAB — ANTI-NUCLEAR AB-TITER (ANA TITER)

## 2023-12-23 LAB — I-STAT CG4 LACTIC ACID, ED: Lactic Acid, Venous: 2.1 mmol/L (ref 0.5–1.9)

## 2023-12-23 MED ORDER — LIDOCAINE HCL 1 % IJ SOLN
INTRAMUSCULAR | Status: AC
  Filled 2023-12-23: qty 20

## 2023-12-23 NOTE — ED Provider Notes (Signed)
 Todd Mccoy Provider Note   CSN: [id number] Arrival date & time: 12/23/23  1552     Patient presents with: Altered Mental Status   Todd Mccoy is a 53 y.o. male.   Is a 53 year old male presenting emergency department for altered mental status.  Had IR paracentesis outpatient based, when he became somnolent.  Reportedly was alert and oriented on arrival.  Had 5.3 L removed, but is somnolent.  I am able to wake him with repeated tapping and speaking mildly, but am unable to to get much history out of him other than his name and that he denies taking drugs or alcohol .   Altered Mental Status      Prior to Admission medications   Medication Sig Start Date End Date Taking? Authorizing Provider  amLODipine  (NORVASC ) 10 MG tablet Take 1 tablet (10 mg total) by mouth daily. 06/20/23   [name], [name], MD  atorvastatin  (LIPITOR) 20 MG tablet TAKE 1 TABLET (20 MG TOTAL) BY MOUTH DAILY. 05/16/23   Gerald Kitty., NP  Baclofen 5 MG TABS Take 1 tablet (5 mg total) by mouth 2 (two) times daily as needed. 12/22/23   Rising, Ivette Marks, PA-C  calcitRIOL  (ROCALTROL ) 0.25 MCG capsule Take 1 capsule (0.25 mcg total) by mouth 2 (two) times daily. Patient not taking: Reported on 12/21/2023 07/28/23   Jacqueline Matsu, MD  carvedilol  (COREG ) 25 MG tablet TAKE 1 TABLET BY MOUTH TWICE DAILY WITH A MEAL . APPOINTMENT REQUIRED FOR FUTURE REFILLS 09/07/23   Jacqueline Matsu, MD  docusate sodium  (COLACE) 100 MG capsule Take 1 capsule (100 mg total) by mouth 2 (two) times daily as needed for mild constipation. Patient not taking: Reported on 12/21/2023 02/14/23   Haydee Lipa, MD  doxycycline  (VIBRAMYCIN ) 100 MG capsule Take 1 capsule (100 mg total) by mouth 2 (two) times daily. 11/16/23   Reuben Castilla, NP  ferrous sulfate  325 (65 FE) MG EC tablet Take 1 tablet (325 mg total) by mouth daily with breakfast. 08/31/23   Hassie Lint, PA-C  furosemide   (LASIX ) 40 MG tablet Take 1 tablet (40 mg total) by mouth 2 (two) times daily. 08/31/23   Hassie Lint, PA-C  gabapentin  (NEURONTIN ) 300 MG capsule Take 2 capsules (600 mg total) by mouth 2 (two) times daily. 08/31/23   Hassie Lint, PA-C  gentamicin ointment (GARAMYCIN) 0.1 % SMARTSIG:sparingly Topical 11/23/23   [provider]  hydrALAZINE  (APRESOLINE ) 50 MG tablet Take 50 mg by mouth 3 (three) times daily. 02/19/23   [provider]  hydrOXYzine  (ATARAX ) 50 MG tablet Take 1 tablet (50 mg total) by mouth 3 (three) times daily as needed for anxiety or itching. 08/31/23   McClung, Angela M, PA-C  insulin  glargine (LANTUS ) 100 UNIT/ML injection Inject 0.05 mLs (5 Units total) into the skin at bedtime. 08/31/23   Hassie Lint, PA-C  isosorbide  mononitrate (IMDUR ) 60 MG 24 hr tablet Take 1 tablet (60 mg total) by mouth daily. 08/31/23   Hassie Lint, PA-C  levofloxacin (LEVAQUIN) 250 MG tablet Take by mouth. Patient not taking: Reported on 12/21/2023 11/23/23   [provider]  magnesium  oxide (MAG-OX) 400 (240 Mg) MG tablet Take 1 tablet (400 mg total) by mouth 2 (two) times daily. Patient not taking: Reported on 12/21/2023 02/14/23   Haydee Lipa, MD  minocycline (MINOCIN) 100 MG capsule Take 100 mg by mouth 2 (two) times daily. 10/29/23   [provider]  multivitamin (RENA-VIT) TABS tablet Take 1 tablet by mouth at bedtime. Patient not taking: Reported on 12/21/2023 02/14/23   Haydee Lipa, MD  nortriptyline  (PAMELOR ) 25 MG capsule Take 2 capsules (50 mg total) by mouth at bedtime. Start with 1 capsule at nighttime for 1 week, then if no side effects increase to 2 capsules at nighttime 10/05/23 02/02/24  Engler, Morgan C, DO  polyethylene glycol (MIRALAX  / GLYCOLAX ) 17 g packet Take 17 g by mouth daily as needed for moderate constipation. 02/14/23   Haydee Lipa, MD  torsemide  (DEMADEX ) 20 MG tablet Take 4 tablets (80 mg total) by mouth  daily. 05/16/23   Gerald Kitty., NP    Allergies: Patient has no known allergies.    Review of Systems  Updated Vital Signs BP (!) 186/111   Pulse 73   Temp 98 F (36.7 C) (Oral)   Resp 12   SpO2 98%   Physical Exam Vitals and nursing note reviewed.  Constitutional:      Comments: Somnolent  HENT:     Nose: Nose normal.     Mouth/Throat:     Mouth: Mucous membranes are dry.   Eyes:     Conjunctiva/sclera: Conjunctivae normal.    Cardiovascular:     Rate and Rhythm: Normal rate and regular rhythm.  Pulmonary:     Effort: Pulmonary effort is normal.     Breath sounds: Normal breath sounds.  Abdominal:     General: Abdomen is flat. There is no distension.     Tenderness: There is no abdominal tenderness. There is no guarding or rebound.   Musculoskeletal:     Right lower leg: Edema present.     Comments: Left lower leg amputation.   Neurological:     Comments: Exam limited by patient participation.  He is somnolent requiring frequent stimuli to stay awake.  He did follow commands and squeeze both fingers was able to hold both arms up momentarily as well as bilateral lower extremity.  No apparent facial droop.  Psychiatric:     Comments: Somnolent     (all labs ordered are listed, but only abnormal results are displayed) Labs Reviewed  GLUCOSE, CAPILLARY - Abnormal; Notable for the following components:      Result Value   Glucose-Capillary 168 (*)    All other components within normal limits  CBC WITH DIFFERENTIAL/PLATELET - Abnormal; Notable for the following components:   WBC 3.7 (*)    RBC 3.88 (*)    Hemoglobin 11.1 (*)    HCT 36.4 (*)    RDW 18.7 (*)    Platelets 105 (*)    All other components within normal limits  COMPREHENSIVE METABOLIC PANEL WITH GFR - Abnormal; Notable for the following components:   Potassium 6.1 (*)    Chloride 96 (*)    Glucose, Bld 172 (*)    BUN 67 (*)    Creatinine, Ser 5.71 (*)    Calcium  8.1 (*)    Albumin  2.5  (*)    Alkaline Phosphatase 258 (*)    Total Bilirubin 1.5 (*)    GFR, Estimated 11 (*)    Anion gap 16 (*)    All other components within normal limits  AMMONIA - Abnormal; Notable for the following components:   Ammonia 55 (*)    All other components within normal limits  I-STAT CG4 LACTIC ACID, ED - Abnormal; Notable for the following components:   Lactic Acid, Venous 2.1 (*)  All other components within normal limits  I-STAT VENOUS BLOOD GAS, ED - Abnormal; Notable for the following components:   pO2, Ven 150 (*)    Calcium , Ion 0.99 (*)    HCT 37.0 (*)    Hemoglobin 12.6 (*)    All other components within normal limits  LIPASE, BLOOD    EKG: None  Radiology: DG Chest Portable 1 View Result Date: 12/23/2023 CLINICAL DATA:  Altered mental status EXAM: PORTABLE CHEST 1 VIEW COMPARISON:  Right rib radiographs dated 10/16/2023 FINDINGS: Patient is rotated slightly to the right. Low lung volumes with bronchovascular crowding. Diffuse interstitial and multifocal patchy opacities. Increased moderate right pleural effusion. No definite pneumothorax. Similar markedly enlarged cardiomediastinal silhouette. Healing mildly displaced right lateral seventh rib fracture. IMPRESSION: 1. Low lung volumes with diffuse interstitial and multifocal patchy opacities, likely pulmonary edema. Multifocal infection can have a similar appearance. 2. Increased moderate right pleural effusion. 3. Similar marked cardiomegaly. 4. Healing mildly displaced right lateral seventh rib fracture. Electronically Signed   By: Limin  Xu M.D.   On: 12/23/2023 18:48   CT Head Wo Contrast Result Date: 12/23/2023 CLINICAL DATA:  Mental status change, unknown cause EXAM: CT HEAD WITHOUT CONTRAST TECHNIQUE: Contiguous axial images were obtained from the base of the skull through the vertex without intravenous contrast. RADIATION DOSE REDUCTION: This exam was performed according to the departmental dose-optimization program  which includes automated exposure control, adjustment of the mA and/or kV according to patient size and/or use of iterative reconstruction technique. COMPARISON:  CT head January 25, 2022. FINDINGS: Brain: No evidence of acute infarction, hemorrhage, hydrocephalus, extra-axial collection or mass lesion/mass effect. Vascular: No hyperdense vessel. Skull: No acute fracture. Sinuses/Orbits: Unchanged right well tamponade. Unchanged high density sub retinal appearance of the posterior left globe. Other: No mastoid effusions. IMPRESSION: 1. No evidence of acute intracranial abnormality. 2. Unchanged chronic orbital findings. Electronically Signed   By: Stevenson Elbe M.D.   On: 12/23/2023 17:46   IR Paracentesis Result Date: 12/23/2023 INDICATION: Patient is a 53 y/o male with end stage renal disease. Patient presents for a therapeutic paracentesis due to ascites. EXAM: ULTRASOUND GUIDED THERAPEUTIC PARACENTESIS MEDICATIONS: 7 mL 1% lidocaine  COMPLICATIONS: None immediate. PROCEDURE: Informed written consent was obtained from the patient after a discussion of the risks, benefits and alternatives to treatment. A timeout was performed prior to the initiation of the procedure. Initial ultrasound scanning demonstrates a large amount of ascites within the left lower abdominal quadrant. The left lower abdomen was prepped and draped in the usual sterile fashion. 1% lidocaine  was used for local anesthesia. Following this, a 6 Fr Safe-T-Centesis catheter was introduced. An ultrasound image was saved for documentation purposes. The paracentesis was performed. The catheter was removed and a dressing was applied. The patient tolerated the procedure well without immediate post procedural complication. FINDINGS: A total of approximately 5.3 liters of clear, yellow fluid was removed. IMPRESSION: Successful ultrasound-guided paracentesis yielding 5.3 liters of peritoneal fluid. Terressa Fess, NP Electronically Signed   By: Nicoletta Barrier M.D.   On: 12/23/2023 16:51     Procedures   Medications Ordered in the ED - No data to display  Clinical Course as of 12/23/23 2340  Fri Dec 23, 2023  1606 Per chart review, appears he was seen yesterday after a fall at urgent care and was given baclofen for pain. May be cause of his mental status change. However, he has numerous comorbidities.  Will therefore get broad screening labs and ct head.  [  TY]  1704 CBC with Differential(!) Stable CBC compared to a few days ago. [TY]  1743 Patient now awake, oriented x 4.  Able to tell me the name, where he is and year, reports that he took all of his medications prior to arrival.  Arnetta Lank he was just very sleepy.  He is requesting to leave currently.  He clinically is sober.  Patient's niece in the lobby and will be coming to bedside. I explained the risks of workup being incomplete at this time.  Made aware of his potassium and his ammonia level.  His EKG does not have changes consistent with hyperkalemia and his potassium was hemolyzed.  No transaminitis to suggest hepatobiliary disease.  Has a next step, urged patient to follow-up with his primary doctors. [TY]  1753 Patient failed member noted that patient is at baseline.  He will be leaving AGAINST MEDICAL ADVICE at this time. [TY]    Clinical Course User Index [TY] Rolinda Climes, DO                                 Medical Decision Making 53 year old male with numerous comorbidities, diabetes, CHF, CKD, obesity, ascites status post IR drainage today.  He is afebrile nontachycardic, is hypertensive, maintaining oxygen saturation on room air.  He is quite somnolent, but will arouse to stimuli and able to tell me his name and will largely follow commands.  Does not appear to have apparent motor deficits.  Per chart review was given baclofen yesterday from urgent care after he sustained a fall.  Will get CT head to evaluate for intracranial pathology.  Given numerous comorbidities, will get  broad screening labs.  However, symptoms could be secondary to baclofen.  See ED course for further MDM/Disposition   Amount and/or Complexity of Data Reviewed Independent Historian:     Details: IR NP said procedure uncomplicated and patient slowly became sleepy postprocedure External Data Reviewed:     Details: See ED course Labs: ordered. Decision-making details documented in ED Course. Radiology: ordered and independent interpretation performed. Decision-making details documented in ED Course. ECG/medicine tests: ordered and independent interpretation performed.  Risk Decision regarding hospitalization. Diagnosis or treatment significantly limited by social determinants of health.      Final diagnoses:  Serum potassium elevated  Hyperammonemia [facility])    ED Discharge Orders     None          Rolinda Climes, DO 12/23/23 2340

## 2023-12-23 NOTE — ED Notes (Signed)
 Pt leaving AMA. Risks explained by provider, pt verbalizes understanding with niece at bedside. Pt advised to stay for further workup by this RN and the EDP. Pt still wishes to leave. Pt refusing discharge vitals.

## 2023-12-23 NOTE — Procedures (Addendum)
 PROCEDURE SUMMARY:  Successful image-guided paracentesis from the left lower abdomen.  Yielded 5.3 liters of clear, yellow fluid.  EBL: zero Patient tolerated well during the procedure.   Specimen not sent for labs.  Please see imaging section of Epic for full dictation of procedure .  Immediately following procedure patient was noted to be lethargic by nursing staff who notified this NP.  At bedside responding to strong physical stimuli and sternal rub.  Lethargy limited participation in neuro exam; however, no upper extremity drift and equal sensation.  L BKA, wan not able to lift either limb from bed, per his report it's the water weight I can't lift. Vision impaired at baseline.  Difficult to keep awake for conversation to assess speech, appears no dysarthria/aphasia.  Denies abdominal pain, chest pain, shortness of breath, any other complaint.  Patient did endorse to nursing staff prior to paracentesis that he had recently had a fall, denied head trauma, endorsed rib pain related to fall.  Fluid obtained during paracentesis was clear and yellow.  In IR suite cardiac rhythm NSR, rate 74, hypertensive during this episode which is how he arrived, blood sugar 160.  Last known well 1530, notified by nursing staff at 1545, and arrived to the ER at 1550.  Report was given to the ER APP prior to leaving the department.  He is also a dialysis patient, had reported to me prior to procedure that he last received dialysis yesterday, his scheduled day.  Discussed patient condition with patient's son via phone Ignacio Mako ) who agrees with decision to bring patient to the emergency department for further workup.  Dason Mosley NP 12/23/2023 3:06 PM

## 2023-12-23 NOTE — Discharge Instructions (Signed)
 Your potassium level is elevated.  You need to follow-up with your primary doctor.  Do not take your potassium pill until you are able to have your potassium levels rechecked.  Return immediately felt fevers, chills, headache, chest pain, shortness of breath, abdominal pain, seizures or he develop any new or worsening symptoms that are concerning to you.

## 2023-12-23 NOTE — ED Triage Notes (Signed)
 Pt arrives from IR post scheduled out-patient paracentesis for AMS. Per IR pt was Aox4 on arrival and is now very somnolent and is only arousable to loud verbal stimulation. Pt had 5.3L removed. Hx dialysis. Pt is oriented x4 when awake.

## 2023-12-25 LAB — ANTI-NUCLEAR AB-TITER (ANA TITER)
ANA TITER: 1:40 {titer} — ABNORMAL HIGH
ANA Titer 1: 1:40 {titer} — ABNORMAL HIGH

## 2023-12-25 LAB — HEPATITIS B SURFACE ANTIGEN: Hepatitis B Surface Ag: NONREACTIVE

## 2023-12-25 LAB — ANA: Anti Nuclear Antibody (ANA): POSITIVE — AB

## 2023-12-25 LAB — TISSUE TRANSGLUTAMINASE ABS,IGG,IGA
(tTG) Ab, IgA: <1 U/mL
(tTG) Ab, IgG: 1.3 U/mL

## 2023-12-25 LAB — HEPATITIS A ANTIBODY, TOTAL: Hepatitis A AB,Total: REACTIVE — AB

## 2023-12-25 LAB — IGG: IgG (Immunoglobin G), Serum: 2333 mg/dL — ABNORMAL HIGH (ref 600–1640)

## 2023-12-25 LAB — HEPATITIS B SURFACE ANTIBODY,QUALITATIVE: Hep B S Ab: REACTIVE — AB

## 2023-12-25 LAB — MITOCHONDRIAL ANTIBODIES: Mitochondrial M2 Ab, IgG: <=20 U (ref <=20.0)

## 2023-12-25 LAB — ANTI-SMOOTH MUSCLE ANTIBODY, IGG: Actin (Smooth Muscle) Antibody (IGG): <20 U (ref <20)

## 2023-12-25 LAB — CERULOPLASMIN: Ceruloplasmin: 34 mg/dL — ABNORMAL HIGH (ref 14–30)

## 2023-12-26 ENCOUNTER — Encounter (HOSPITAL_BASED_OUTPATIENT_CLINIC_OR_DEPARTMENT_OTHER): Attending: General Surgery | Admitting: General Surgery

## 2023-12-26 DIAGNOSIS — I5042 Chronic combined systolic (congestive) and diastolic (congestive) heart failure: Secondary | ICD-10-CM | POA: Diagnosis not present

## 2023-12-26 DIAGNOSIS — E11622 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 ulcer: Secondary | ICD-10-CM | POA: Diagnosis not present

## 2023-12-26 DIAGNOSIS — L97522 Non-pressure chronic ulcer of other part of left foot with fat layer exposed: Secondary | ICD-10-CM | POA: Diagnosis not present

## 2023-12-26 DIAGNOSIS — E11621 Type 2 diabetes mellitus with foot ulcer: Secondary | ICD-10-CM | POA: Insufficient documentation

## 2023-12-26 DIAGNOSIS — E1122 Type 2 diabetes mellitus with diabetic chronic kidney disease: Secondary | ICD-10-CM | POA: Diagnosis not present

## 2023-12-26 DIAGNOSIS — N186 End stage renal disease: Secondary | ICD-10-CM | POA: Diagnosis not present

## 2023-12-26 DIAGNOSIS — L97513 Non-pressure chronic ulcer of other part of right foot with necrosis of muscle: Secondary | ICD-10-CM | POA: Insufficient documentation

## 2023-12-26 DIAGNOSIS — L97512 Non-pressure chronic ulcer of other part of right foot with fat layer exposed: Secondary | ICD-10-CM | POA: Diagnosis not present

## 2023-12-27 ENCOUNTER — Telehealth: Payer: Self-pay | Admitting: Family Medicine

## 2023-12-27 NOTE — Telephone Encounter (Signed)
 1st attempt: Contacted pt left vm to confirmed appt

## 2023-12-28 ENCOUNTER — Ambulatory Visit (HOSPITAL_COMMUNITY): Admission: RE | Admit: 2023-12-28 | Source: Ambulatory Visit

## 2023-12-28 NOTE — Telephone Encounter (Signed)
 2nd attempt: Volunteer reach out to the pt to confirm appt

## 2023-12-29 ENCOUNTER — Ambulatory Visit: Payer: Medicare Other | Attending: Family Medicine | Admitting: Family Medicine

## 2023-12-29 ENCOUNTER — Encounter: Payer: Self-pay | Admitting: Family Medicine

## 2023-12-29 ENCOUNTER — Other Ambulatory Visit: Payer: Self-pay

## 2023-12-29 VITALS — BP 161/88 | HR 82 | Ht 70.0 in

## 2023-12-29 DIAGNOSIS — R0789 Other chest pain: Secondary | ICD-10-CM

## 2023-12-29 DIAGNOSIS — E1165 Type 2 diabetes mellitus with hyperglycemia: Secondary | ICD-10-CM

## 2023-12-29 DIAGNOSIS — Z794 Long term (current) use of insulin: Secondary | ICD-10-CM

## 2023-12-29 DIAGNOSIS — N184 Chronic kidney disease, stage 4 (severe): Secondary | ICD-10-CM

## 2023-12-29 DIAGNOSIS — E1122 Type 2 diabetes mellitus with diabetic chronic kidney disease: Secondary | ICD-10-CM

## 2023-12-29 DIAGNOSIS — S2231XD Fracture of one rib, right side, subsequent encounter for fracture with routine healing: Secondary | ICD-10-CM

## 2023-12-29 DIAGNOSIS — H547 Unspecified visual loss: Secondary | ICD-10-CM

## 2023-12-29 DIAGNOSIS — I129 Hypertensive chronic kidney disease with stage 1 through stage 4 chronic kidney disease, or unspecified chronic kidney disease: Secondary | ICD-10-CM | POA: Diagnosis not present

## 2023-12-29 LAB — POCT GLYCOSYLATED HEMOGLOBIN (HGB A1C): HbA1c, POC (controlled diabetic range): 6.8 % (ref 0.0–7.0)

## 2023-12-29 MED ORDER — LIDOCAINE 5 % EX PTCH: 1.0000 | MEDICATED_PATCH | CUTANEOUS | 1 refills | Status: DC

## 2023-12-29 NOTE — Progress Notes (Signed)
 Subjective:  Patient ID: Todd Mccoy, male    DOB: 1970-11-03  Age: 53 y.o. MRN: [id number]  CC: Medical Management of Chronic Issues (Pain in ribcage/Increase in PCS hours)     Discussed the use of AI scribe software for clinical note transcription with the patient, who gave verbal consent to proceed.  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 He is accompanied by a niece.  Today he is requesting completion of a form for increasing PCS hours. He experiences significant vision deterioration, with only 10% vision remaining in one eye, and is under ophthalmologic care. He has persistent left rib cage pain following a fall, with a right seventh rib fracture noted on a chest x-ray. He has not used baclofen  for pain relief. Gabapentin  600 mg twice daily is ineffective for his nerve pain. Nortriptyline  at bedtime causes hallucinations. He is on dialysis, with increased nerve pain post-session. A recent ER visit was due to elevated potassium levels and he was somnolent and so was advised against taking Baclofen  but he attributes his somnolence that day to lack of sleep.    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/2020    pt states retinal eye damage- left worse than the right- recent visited MD    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/2020   pt states recent MD visit /left eye worse than right   Dyslipidemia 12/10/2020   Hx of BKA, left (HCC)    Hypertension    Morbid obesity (HCC)    Osteomyelitis (HCC)    Pneumonia    Pulmonary HTN (HCC)    mild to moderate (PASP53mmHg) by echo 07/2023    Past Surgical History:  Procedure Laterality Date   ABSCESS DRAINAGE     neck   AMPUTATION Left 11/14/2020   Procedure: LEFT 5TH RAY AMPUTATION;  Surgeon: Harden Jerona GAILS, MD;  Location: [facility] OR;  Service: Orthopedics;  Laterality: Left;   AMPUTATION Left 12/10/2020   Procedure: LEFT BELOW KNEE AMPUTATION;  Surgeon: Harden Jerona GAILS, MD;  Location: [facility] OR;  Service: Orthopedics;  Laterality: Left;   AMPUTATION TOE Right 03/25/2020   Procedure: AMPUTATION TOE;  Surgeon: Gretel Ozell PARAS, DPM;  Location: [facility] ORS;  Service: Podiatry;  Laterality: Right;   AV FISTULA PLACEMENT Right 05/23/2023   Procedure: RIGHT ARM Brachiocephalic ARTERIOVENOUS (AV) FISTULA CREATION;  Surgeon: Lanis Fonda BRAVO, MD;  Location: [facility] OR;  Service: Vascular;  Laterality: Right;   INCISION AND DRAINAGE ABSCESS Right 09/07/2014   Procedure: INCISION AND DRAINAGE ABSCESS RIGHT FLANK;  Surgeon: Krystal Russell, MD;  Location: [facility] ORS;  Service: General;  Laterality: Right;   IR FLUORO GUIDE CV LINE RIGHT  02/11/2023  IR PARACENTESIS  10/03/2023   IR PARACENTESIS  10/19/2023   IR PARACENTESIS  11/07/2023   IR PARACENTESIS  11/30/2023   IR PARACENTESIS  12/23/2023   IR REMOVAL TUN CV CATH W/O FL  11/30/2023   IR US  GUIDE VASC ACCESS RIGHT  02/11/2023   LEG AMPUTATION BELOW KNEE Left 12/10/2020   SCROTUM EXPLORATION      Family History  Problem Relation Age of Onset   Diabetes Mother    Diabetes Father    Diabetes Brother    Colon cancer Neg Hx    Esophageal cancer Neg Hx    Pancreatic cancer Neg Hx    Stomach cancer Neg Hx    Liver disease  Neg Hx    CAD Neg Hx     Social History   Socioeconomic History   Marital status: Single    Spouse name: Not on file   Number of children: 1   Years of education: Not on file   Highest education level: Not on file  Occupational History   Not on file  Tobacco Use   Smoking status: Every Day    Current packs/day: 0.00    Average packs/day: 0.5 packs/day for 32.0 years (16.0 ttl pk-yrs)    Types: Cigarettes    Start date: 08/24/1989    Last attempt to quit: 08/24/2021    Years since quitting: 2.3    Passive exposure: Past   Smokeless tobacco: Former   Tobacco comments:    Quit smoking previously February 2023  Vaping Use   Vaping status: Never Used  Substance and Sexual Activity   Alcohol  use: No   Drug use: No   Sexual activity: Not on file  Other Topics Concern   Not on file  Social History Narrative   Not on file   Social Drivers of Health   Financial Resource Strain: Low Risk  (08/31/2023)   Overall Financial Resource Strain (CARDIA)    Difficulty of Paying Living Expenses: Not hard at all  Food Insecurity: No Food Insecurity (08/31/2023)   Hunger Vital Sign    Worried About Running Out of Food in the Last Year: Never true    Ran Out of Food in the Last Year: Never true  Transportation Needs: No Transportation Needs (08/31/2023)   PRAPARE - Administrator, Civil Service (Medical): No    Lack of Transportation (Non-Medical): No  Physical Activity: Inactive (08/31/2023)   Exercise Vital Sign    Days of Exercise per Week: 0 days    Minutes of Exercise per Session: 0 min  Stress: No Stress Concern Present (08/31/2023)   [employer] of Occupational Health - Occupational Stress Questionnaire    Feeling of Stress : Only a little  Social Connections: Socially Isolated (08/31/2023)   Social Connection and Isolation Panel    Frequency of Communication with Friends and Family: More than three times a week    Frequency of Social Gatherings with Friends and  Family: Once a week    Attends Religious Services: Never    Database administrator or Organizations: No    Attends Engineer, structural: Never    Marital Status: Never married    No Known Allergies  Outpatient Medications Prior to Visit  Medication Sig Dispense Refill   amLODipine  (NORVASC ) 10 MG tablet Take 1 tablet (10 mg total) by mouth daily. 30 tablet 0   atorvastatin  (LIPITOR) 20 MG tablet TAKE 1 TABLET (20 MG TOTAL) BY MOUTH DAILY. 90 tablet  3   Baclofen  5 MG TABS Take 1 tablet (5 mg total) by mouth 2 (two) times daily as needed. 20 tablet 0   carvedilol  (COREG ) 25 MG tablet TAKE 1 TABLET BY MOUTH TWICE DAILY WITH A MEAL . APPOINTMENT REQUIRED FOR FUTURE REFILLS 90 tablet 2   doxycycline  (VIBRAMYCIN ) 100 MG capsule Take 1 capsule (100 mg total) by mouth 2 (two) times daily. 28 capsule 0   ferrous sulfate  325 (65 FE) MG EC tablet Take 1 tablet (325 mg total) by mouth daily with breakfast. 30 tablet 3   furosemide  (LASIX ) 40 MG tablet Take 1 tablet (40 mg total) by mouth 2 (two) times daily. 60 tablet 2   gabapentin  (NEURONTIN ) 300 MG capsule Take 2 capsules (600 mg total) by mouth 2 (two) times daily. 120 capsule 3   gentamicin ointment (GARAMYCIN) 0.1 % SMARTSIG:sparingly Topical     hydrALAZINE  (APRESOLINE ) 50 MG tablet Take 50 mg by mouth 3 (three) times daily.     hydrOXYzine  (ATARAX ) 50 MG tablet Take 1 tablet (50 mg total) by mouth 3 (three) times daily as needed for anxiety or itching. 30 tablet 2   insulin  glargine (LANTUS ) 100 UNIT/ML injection Inject 0.05 mLs (5 Units total) into the skin at bedtime. 10 mL 2   isosorbide  mononitrate (IMDUR ) 60 MG 24 hr tablet Take 1 tablet (60 mg total) by mouth daily. 60 tablet 2   levofloxacin (LEVAQUIN) 250 MG tablet Take by mouth.     magnesium  oxide (MAG-OX) 400 (240 Mg) MG tablet Take 1 tablet (400 mg total) by mouth 2 (two) times daily. 60 tablet 2   minocycline (MINOCIN) 100 MG capsule Take 100 mg by mouth 2 (two) times  daily.     multivitamin (RENA-VIT) TABS tablet Take 1 tablet by mouth at bedtime. 30 tablet 2   nortriptyline  (PAMELOR ) 25 MG capsule Take 2 capsules (50 mg total) by mouth at bedtime. Start with 1 capsule at nighttime for 1 week, then if no side effects increase to 2 capsules at nighttime 60 capsule 3   polyethylene glycol (MIRALAX  / GLYCOLAX ) 17 g packet Take 17 g by mouth daily as needed for moderate constipation. 14 each 0   torsemide  (DEMADEX ) 20 MG tablet Take 4 tablets (80 mg total) by mouth daily. 360 tablet 0   calcitRIOL  (ROCALTROL ) 0.25 MCG capsule Take 1 capsule (0.25 mcg total) by mouth 2 (two) times daily. (Patient not taking: Reported on 12/29/2023) 180 capsule 3   docusate sodium  (COLACE) 100 MG capsule Take 1 capsule (100 mg total) by mouth 2 (two) times daily as needed for mild constipation. (Patient not taking: Reported on 12/29/2023) 10 capsule 0   No facility-administered medications prior to visit.     ROS Review of Systems  Constitutional:  Negative for activity change and appetite change.  HENT:  Negative for sinus pressure and sore throat.   Eyes:  Positive for visual disturbance.  Respiratory:  Negative for chest tightness, shortness of breath and wheezing.   Cardiovascular:  Positive for chest pain. Negative for palpitations.  Gastrointestinal:  Negative for abdominal distention, abdominal pain and constipation.  Genitourinary: Negative.   Musculoskeletal: Negative.   Psychiatric/Behavioral:  Negative for behavioral problems and dysphoric mood.     Objective:  BP (!) 161/88   Pulse 82   Ht 5' 10 (1.778 m)   SpO2 98%   BMI 27.52 kg/m      12/29/2023   10:33 AM 12/29/2023    9:43 AM 12/23/2023  5:00 PM  BP/Weight  Systolic BP 161 173 186  Diastolic BP 88 92 111      Physical Exam Constitutional:      Appearance: He is well-developed.   Cardiovascular:     Rate and Rhythm: Normal rate.     Heart sounds: Normal heart sounds. No murmur  heard. Pulmonary:     Effort: Pulmonary effort is normal.     Breath sounds: Normal breath sounds. No wheezing or rales.  Chest:     Chest wall: Tenderness (L chest wall in inframammary region) present.  Abdominal:     General: Bowel sounds are normal. There is distension.     Palpations: Abdomen is soft. There is no mass.     Tenderness: There is no abdominal tenderness.   Musculoskeletal:     Right lower leg: No edema.     Left lower leg: No edema.     Comments: Left BKA   Neurological:     Mental Status: He is alert and oriented to person, place, and time.   Psychiatric:        Mood and Affect: Mood normal.        Latest Ref Rng & Units 12/23/2023    5:11 PM 12/23/2023    4:30 PM 12/21/2023   10:40 AM  CMP  Glucose 70 - 99 mg/dL  827  858   BUN 6 - 20 mg/dL  67  41   Creatinine 9.38 - 1.24 mg/dL  4.28  5.80   Sodium 864 - 145 mmol/L 138  136  136   Potassium 3.5 - 5.1 mmol/L 4.4  6.1  3.7   Chloride 98 - 111 mmol/L  96  95   CO2 22 - 32 mmol/L  24  31   Calcium  8.9 - 10.3 mg/dL  8.1  8.3   Total Protein 6.5 - 8.1 g/dL  7.3  7.6   Total Bilirubin 0.0 - 1.2 mg/dL  1.5  1.2   Alkaline Phos 38 - 126 U/L  258  276   AST 15 - 41 U/L  31  17   ALT 0 - 44 U/L  17  14     Lipid Panel     Component Value Date/Time   CHOL 72 (L) 08/31/2023 1002   TRIG 44 08/31/2023 1002   HDL 35 (L) 08/31/2023 1002   CHOLHDL 2.1 08/31/2023 1002   LDLCALC 25 08/31/2023 1002   LDLDIRECT 36 05/21/2021 1057    CBC    Component Value Date/Time   WBC 3.7 (L) 12/23/2023 1630   RBC 3.88 (L) 12/23/2023 1630   HGB 12.6 (L) 12/23/2023 1711   HGB 8.4 (L) 04/17/2020 1716   HCT 37.0 (L) 12/23/2023 1711   HCT 26.3 (L) 04/17/2020 1716   PLT 105 (L) 12/23/2023 1630   PLT 197 04/17/2020 1716   MCV 93.8 12/23/2023 1630   MCV 85 04/17/2020 1716   MCH 28.6 12/23/2023 1630   MCHC 30.5 12/23/2023 1630   RDW 18.7 (H) 12/23/2023 1630   RDW 14.8 04/17/2020 1716   LYMPHSABS 0.8 12/23/2023 1630    LYMPHSABS 1.9 04/17/2020 1716   MONOABS 0.5 12/23/2023 1630   EOSABS 0.1 12/23/2023 1630   EOSABS 0.1 04/17/2020 1716   BASOSABS 0.0 12/23/2023 1630   BASOSABS 0.0 04/17/2020 1716    Lab Results  Component Value Date   HGBA1C 6.8 12/29/2023      1. Closed fracture of one rib of right side with routine healing,  subsequent encounter He has no right-sided chest wall pain  2. Type 2 diabetes mellitus with hyperglycemia, with long-term current use of insulin  (HCC) (Primary) Controlled with A1c of 6.8 Continue current management - POCT glycosylated hemoglobin (Hb A1C)  3. Chest wall pain Uncontrolled He has not been taking baclofen  Advised to resume baclofen  Lidoderm  patch also prescribed He also has gabapentin  and nortriptyline  on his med list  4. Vision loss Diabetic retinopathy He does qualify for increased PCS hours and I have completed the form  5. Hypertension associated with stage 4 chronic kidney disease due to type 2 diabetes mellitus (HCC) Elevated blood pressure He is on his way to hemodialysis hence I will not make any regimen adjustments in his antihypertensives to prevent hypotension postdialysis   Meds ordered this encounter  Medications   lidocaine  (LIDODERM ) 5 %    Sig: Place 1 patch onto the skin daily. Remove & Discard patch within 12 hours or as directed by MD    Dispense:  30 patch    Refill:  1    Follow-up: Return for previously scheduled appointment.       Corrina Sabin, MD, FAAFP. [facility] [place], [name] [phone]   12/29/2023, 2:15 PM

## 2024-01-05 ENCOUNTER — Other Ambulatory Visit (HOSPITAL_COMMUNITY): Payer: Self-pay | Admitting: Nephrology

## 2024-01-05 DIAGNOSIS — R188 Other ascites: Secondary | ICD-10-CM

## 2024-01-09 ENCOUNTER — Encounter (HOSPITAL_BASED_OUTPATIENT_CLINIC_OR_DEPARTMENT_OTHER): Attending: General Surgery | Admitting: General Surgery

## 2024-01-09 DIAGNOSIS — I5042 Chronic combined systolic (congestive) and diastolic (congestive) heart failure: Secondary | ICD-10-CM | POA: Diagnosis not present

## 2024-01-09 DIAGNOSIS — N186 End stage renal disease: Secondary | ICD-10-CM | POA: Insufficient documentation

## 2024-01-09 DIAGNOSIS — L97812 Non-pressure chronic ulcer of other part of right lower leg with fat layer exposed: Secondary | ICD-10-CM | POA: Insufficient documentation

## 2024-01-09 DIAGNOSIS — E11621 Type 2 diabetes mellitus with foot ulcer: Secondary | ICD-10-CM | POA: Diagnosis present

## 2024-01-09 DIAGNOSIS — L97512 Non-pressure chronic ulcer of other part of right foot with fat layer exposed: Secondary | ICD-10-CM | POA: Diagnosis not present

## 2024-01-09 DIAGNOSIS — E11622 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 ulcer: Secondary | ICD-10-CM | POA: Diagnosis not present

## 2024-01-09 DIAGNOSIS — L97822 Non-pressure chronic ulcer of other part of left lower leg with fat layer exposed: Secondary | ICD-10-CM | POA: Insufficient documentation

## 2024-01-09 DIAGNOSIS — E1122 Type 2 diabetes mellitus with diabetic chronic kidney disease: Secondary | ICD-10-CM | POA: Insufficient documentation

## 2024-01-09 DIAGNOSIS — L97513 Non-pressure chronic ulcer of other part of right foot with necrosis of muscle: Secondary | ICD-10-CM | POA: Insufficient documentation

## 2024-01-16 ENCOUNTER — Other Ambulatory Visit: Payer: Self-pay | Admitting: Physician Assistant

## 2024-01-16 DIAGNOSIS — Z89512 Acquired absence of left leg below knee: Secondary | ICD-10-CM

## 2024-01-20 ENCOUNTER — Telehealth: Payer: Self-pay

## 2024-01-20 NOTE — Telephone Encounter (Signed)
 Darice Gulling (RN) with [facility] called to let Rocky know that she went to see the patient for re-certification today and he wasn't feeling well since he had dialysis yesterday. Stated that she needs ok to do re-cert on Monday   Please call and advise at [phone]

## 2024-01-23 NOTE — Telephone Encounter (Signed)
 VO approval for recert given to Todd Mccoy.

## 2024-01-25 ENCOUNTER — Encounter (HOSPITAL_BASED_OUTPATIENT_CLINIC_OR_DEPARTMENT_OTHER): Admitting: General Surgery

## 2024-01-25 DIAGNOSIS — E11621 Type 2 diabetes mellitus with foot ulcer: Secondary | ICD-10-CM | POA: Diagnosis not present

## 2024-02-03 ENCOUNTER — Ambulatory Visit (HOSPITAL_COMMUNITY)
Admission: RE | Admit: 2024-02-03 | Discharge: 2024-02-03 | Disposition: A | Discharge status: Home / Self Care | Source: Ambulatory Visit | Attending: Nephrology | Admitting: Nephrology

## 2024-02-03 DIAGNOSIS — R188 Other ascites: Secondary | ICD-10-CM | POA: Diagnosis present

## 2024-02-03 HISTORY — PX: IR PARACENTESIS: IMG2679

## 2024-02-03 MED ORDER — LIDOCAINE-EPINEPHRINE 1 %-1:100000 IJ SOLN: 20.0000 mL | Freq: Once | INTRAMUSCULAR | Status: DC

## 2024-02-03 MED ORDER — LIDOCAINE-EPINEPHRINE 1 %-1:100000 IJ SOLN
INTRAMUSCULAR | Status: AC
  Filled 2024-02-03: qty 1

## 2024-02-08 ENCOUNTER — Encounter (HOSPITAL_BASED_OUTPATIENT_CLINIC_OR_DEPARTMENT_OTHER): Attending: General Surgery | Admitting: General Surgery

## 2024-02-08 DIAGNOSIS — N186 End stage renal disease: Secondary | ICD-10-CM | POA: Insufficient documentation

## 2024-02-08 DIAGNOSIS — I5042 Chronic combined systolic (congestive) and diastolic (congestive) heart failure: Secondary | ICD-10-CM | POA: Diagnosis not present

## 2024-02-08 DIAGNOSIS — E11622 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 ulcer: Secondary | ICD-10-CM | POA: Diagnosis not present

## 2024-02-08 DIAGNOSIS — L97513 Non-pressure chronic ulcer of other part of right foot with necrosis of muscle: Secondary | ICD-10-CM | POA: Insufficient documentation

## 2024-02-08 DIAGNOSIS — E11621 Type 2 diabetes mellitus with foot ulcer: Secondary | ICD-10-CM | POA: Diagnosis present

## 2024-02-08 DIAGNOSIS — L97822 Non-pressure chronic ulcer of other part of left lower leg with fat layer exposed: Secondary | ICD-10-CM | POA: Insufficient documentation

## 2024-02-08 DIAGNOSIS — E1122 Type 2 diabetes mellitus with diabetic chronic kidney disease: Secondary | ICD-10-CM | POA: Diagnosis not present

## 2024-02-08 DIAGNOSIS — L97812 Non-pressure chronic ulcer of other part of right lower leg with fat layer exposed: Secondary | ICD-10-CM | POA: Insufficient documentation

## 2024-02-10 ENCOUNTER — Other Ambulatory Visit: Payer: Self-pay | Admitting: Family Medicine

## 2024-02-10 DIAGNOSIS — Z89512 Acquired absence of left leg below knee: Secondary | ICD-10-CM

## 2024-02-15 ENCOUNTER — Telehealth: Payer: Self-pay

## 2024-02-15 DIAGNOSIS — I5023 Acute on chronic systolic (congestive) heart failure: Secondary | ICD-10-CM

## 2024-02-15 NOTE — Telephone Encounter (Signed)
 Copied from CRM #[id number]. Topic: General - Other >> Feb 15, 2024  4:17 PM Todd Mccoy wrote: Reason for CRM: Patient is calling in because his provider signed a letter for him but he said it was denied and they told him to follow up with his provider. Please follow up with patient.

## 2024-02-16 ENCOUNTER — Telehealth: Payer: Self-pay | Admitting: *Deleted

## 2024-02-16 NOTE — Progress Notes (Signed)
 Complex Care Management Note Care Guide Note  02/16/2024 Name: Todd Mccoy MRN: [id number] DOB: 17-Feb-1971   Complex Care Management Outreach Attempts: An unsuccessful telephone outreach was attempted today to offer the patient information about available complex care management services.  Follow Up Plan:  Additional outreach attempts will be made to offer the patient complex care management information and services.   Encounter Outcome:  No Answer  Harlene Satterfield  [facility]  [facility], [facility] Guide  Direct Dial : [phone]  Fax [phone]

## 2024-02-16 NOTE — Telephone Encounter (Signed)
 Patient was called and he states that his PCS form for additional hours was denied. I informed him that we completed the form correctly and that he would need to reach out to [facility] to discuss why additional hours were denied.

## 2024-02-17 NOTE — Progress Notes (Signed)
 Complex Care Management Note  Care Guide Note 02/17/2024 Name: HJALMAR BALLENGEE MRN: [id number] DOB: 1971-05-26  BRIX BREARLEY is a 53 y.o. year old male who sees Delbert Clam, MD for primary care. I reached out to [name] by phone today to offer complex care management services.  Mr. Tuckerman was given information about Complex Care Management services today including:   The Complex Care Management services include support from the care team which includes your Nurse Care Manager, Clinical Social Worker, or Pharmacist.  The Complex Care Management team is here to help remove barriers to the health concerns and goals most important to you. Complex Care Management services are voluntary, and the patient may decline or stop services at any time by request to their care team member.   Complex Care Management Consent Status: Patient agreed to services and verbal consent obtained.   Follow up plan:  Telephone appointment with complex care management team member scheduled for:  02/24/24  Encounter Outcome:  Patient Scheduled  Harlene Satterfield  [facility]  [facility], [facility] Guide  Direct Dial : [phone]  Fax [phone]

## 2024-02-17 NOTE — Progress Notes (Signed)
 Complex Care Management Note Care Guide Note  02/17/2024 Name: Todd Mccoy MRN: [id number] DOB: 1971-06-10   Complex Care Management Outreach Attempts: A second unsuccessful outreach was attempted today to offer the patient with information about available complex care management services.  Follow Up Plan:  Additional outreach attempts will be made to offer the patient complex care management information and services.   Encounter Outcome:  No Answer  Harlene Satterfield  [facility]  [facility], [facility] Guide  Direct Dial : [phone]  Fax [phone]

## 2024-02-21 ENCOUNTER — Other Ambulatory Visit: Payer: Self-pay | Admitting: Cardiology

## 2024-02-22 ENCOUNTER — Encounter (HOSPITAL_BASED_OUTPATIENT_CLINIC_OR_DEPARTMENT_OTHER): Admitting: General Surgery

## 2024-02-22 DIAGNOSIS — E11621 Type 2 diabetes mellitus with foot ulcer: Secondary | ICD-10-CM | POA: Diagnosis not present

## 2024-02-24 ENCOUNTER — Other Ambulatory Visit: Payer: Self-pay

## 2024-02-24 DIAGNOSIS — E1122 Type 2 diabetes mellitus with diabetic chronic kidney disease: Secondary | ICD-10-CM

## 2024-02-24 NOTE — Patient Outreach (Signed)
 Complex Care Management   Visit Note  02/24/2024  Name:  Todd Mccoy MRN: [id number] DOB: 07-May-1971  Situation: Referral received for Complex Care Management related to SDOH Barriers: Food Insecurity, housing and linkage to dentist. Patient is also requesting assistance with obtaining more person care assistant hours.  I obtained verbal consent from Patient.  Visit completed with Patient  on the phone.  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/2020   pt states recent MD visit /left eye worse than right   Dyslipidemia 12/10/2020   Hx of BKA, left (HCC)    Hypertension    Morbid obesity (HCC)    Osteomyelitis (HCC)    Pneumonia    Pulmonary HTN (HCC)    mild to moderate (PASP53mmHg) by echo 07/2023    Assessment: Patient Reported Symptoms:  Cognitive Cognitive Status: Normal speech and language skills, Alert and oriented to person, place, and time Cognitive/Intellectual Conditions Management [RPT]: None reported or documented in medical history or problem list      Neurological Neurological Review of Symptoms: No symptoms reported    HEENT HEENT Symptoms Reported: No symptoms reported, Other: (Patient reports vision lost in both eyes) HEENT Management Strategies: Routine screening    Cardiovascular Cardiovascular Symptoms Reported: No symptoms reported Does patient have uncontrolled Hypertension?: No (Patient reports hypertension is controlled) Cardiovascular Management  Strategies: Medication therapy  Respiratory Respiratory Symptoms Reported: No symptoms reported    Endocrine Endocrine Symptoms Reported: No symptoms reported Is patient diabetic?: Yes Is patient checking blood sugars at home?: Yes List most recent blood sugar readings, include date and time of day: Patient repots a blood sugar reading of 140 Endocrine Self-Management Outcome: 3 (uncertain)  Gastrointestinal Gastrointestinal Symptoms Reported: Other Other Gastrointestinal Symptoms: Patient reports fluid retention which needs to be drained      Genitourinary Genitourinary Symptoms Reported: Incontinence Genitourinary Management Strategies: Hemodialysis Hemodialysis Schedule: Tuesday, Thursday and Saturday Hemodialysis Last Treatment: 02/23/24  Integumentary Integumentary Symptoms Reported: Wound Additional Integumentary Details: Patient reports wound on feet, Patient reports going to wound care and a RN Skin Management Strategies: Routine screening  Musculoskeletal Musculoskelatal Symptoms Reviewed: Difficulty walking Additional Musculoskeletal Details: Patient reports having a prothetic leg Musculoskeletal Management Strategies: Routine screening Falls in the past year?: Yes Number of falls in past year: 2 or more Was there an injury with Fall?: Yes (Went to the hospital) Fall Risk Category Calculator: 3 Patient Fall Risk Level: High Fall Risk Patient at Risk for Falls Due to: Impaired balance/gait Fall risk Follow up: Falls evaluation completed  Psychosocial Psychosocial Symptoms Reported: No symptoms reported Additional Psychological Details: Patient reports history of anxisety however that was years ago     Quality of Family Relationships: helpful Do you feel physically threatened by others?: No    02/24/2024    PHQ2-9 Depression Screening   Little interest or pleasure in doing things Not at all  Feeling down, depressed, or hopeless Not at all  PHQ-2 - Total Score 0   Trouble falling or staying asleep, or sleeping too much    Feeling tired or having little energy    Poor appetite or overeating     Feeling bad about yourself - or that you are a failure or have let yourself or your family down    Trouble concentrating on things, such as reading the newspaper or watching television    Moving or speaking so slowly that other people could have noticed.  Or the opposite - being so fidgety or restless that you have been moving around a lot more than usual    Thoughts that you would be better off dead, or hurting yourself in some way    PHQ2-9 Total Score    If you checked off any problems, how difficult have these problems made it for you to do your work, take care of things at home, or get along with other people    Depression Interventions/Treatment      There were no vitals filed for this visit.  Medications Reviewed Today     Reviewed by Sherren Olam FORBES KEN (Social Worker) on 02/24/24 at 1014  Med List Status: <None>   Medication Order Taking? Sig Documenting Provider Last Dose Status Informant  amLODipine  (NORVASC ) 10 MG tablet [id number] Yes Take 1 tablet (10 mg total) by mouth daily. [name], [name], MD  Active   atorvastatin  (LIPITOR) 20 MG tablet [id number] Yes TAKE 1 TABLET (20 MG TOTAL) BY MOUTH DAILY. Wyn Jackee VEAR Mickey., NP  Active Family Member  Baclofen  5 MG TABS [id number] Yes Take 1 tablet (5 mg total) by mouth 2 (two) times daily as needed. Rising, Asberry, PA-C  Active   calcitRIOL  (ROCALTROL ) 0.25 MCG capsule [id number]  Take 1 capsule (0.25 mcg total) by mouth 2 (two) times daily.  Patient not taking: Reported on 12/29/2023   Shlomo Wilbert SAUNDERS, MD  Active   carvedilol  (COREG ) 25 MG tablet [id number] Yes Take 1 tablet (25 mg total) by mouth 2 (two) times daily with a meal. Turner, Wilbert SAUNDERS, MD  Active   docusate sodium  (COLACE) 100 MG capsule [id number]  Take 1 capsule (100 mg total) by mouth 2 (two) times daily as needed for mild constipation.   Patient not taking: Reported on 12/29/2023   Lue Elsie BROCKS, MD  Active Family Member  doxycycline  (VIBRAMYCIN ) 100 MG capsule [id number] Yes Take 1 capsule (100 mg total) by mouth 2 (two) times daily. Valdemar Rocky SAUNDERS, NP  Active   ferrous sulfate  325 (65 FE) MG EC tablet [id number] Yes Take 1 tablet (325 mg total) by mouth daily with breakfast. Danton Jon HERO, PA-C  Active   furosemide  (LASIX ) 40 MG tablet [id number] Yes Take 1 tablet (40 mg total) by mouth 2 (two) times daily. Danton Jon M, PA-C  Active   gabapentin  (NEURONTIN ) 300 MG capsule [id number] Yes Take 2 capsules by mouth twice daily [name], [name], MD  Active   gentamicin ointment (GARAMYCIN) 0.1 % [id number] Yes SMARTSIG:sparingly Topical [provider]  Active   hydrALAZINE  (APRESOLINE ) 50 MG tablet [id number] Yes Take 50 mg by mouth 3 (three) times daily. [provider]  Active Family Member  hydrOXYzine  (ATARAX ) 50 MG tablet [id number]  Take 1 tablet (50 mg total) by mouth 3 (three) times daily as needed for anxiety or itching.  Patient not taking: Reported on 02/24/2024   Danton Jon HERO, PA-C  Active   insulin  glargine (LANTUS ) 100 UNIT/ML injection [id number] Yes Inject 0.05  mLs (5 Units total) into the skin at bedtime. Danton Slough M, PA-C  Active   isosorbide  mononitrate (IMDUR ) 60 MG 24 hr tablet [id number] Yes Take 1 tablet (60 mg total) by mouth daily. Danton Slough M, PA-C  Active   levofloxacin (LEVAQUIN) 250 MG tablet [id number] Yes Take by mouth. [provider]  Active   lidocaine  (LIDODERM ) 5 % [id number]  Place 1 patch onto the skin daily. Remove & Discard patch within 12 hours or as directed by MD  Patient not taking: Reported on 02/24/2024   [name], [name], MD  Active   magnesium  oxide (MAG-OX) 400 (240 Mg) MG tablet [id number] Yes Take 1 tablet (400 mg total) by mouth 2 (two) times daily. Lue Elsie BROCKS, MD  Active Family Member  minocycline (MINOCIN) 100 MG capsule  [id number]  Take 100 mg by mouth 2 (two) times daily.  Patient not taking: Reported on 02/24/2024   [provider]  Active   multivitamin (RENA-VIT) TABS tablet [id number] Yes Take 1 tablet by mouth at bedtime. Lue Elsie BROCKS, MD  Active Family Member  nortriptyline  (PAMELOR ) 25 MG capsule [id number] Yes Take 2 capsules (50 mg total) by mouth at bedtime. Start with 1 capsule at nighttime for 1 week, then if no side effects increase to 2 capsules at nighttime [place], Joesph C, DO  Active   polyethylene glycol (MIRALAX  / GLYCOLAX ) 17 g packet [id number] Yes Take 17 g by mouth daily as needed for moderate constipation. Lue Elsie BROCKS, MD  Active Family Member  torsemide  (DEMADEX ) 20 MG tablet [id number] Yes Take 4 tablets (80 mg total) by mouth daily. Wyn Jackee VEAR Mickey., NP  Active Family Member            Recommendation:   Continue Current Plan of Care  Follow Up Plan:   Telephone follow-up LCSW 02/29/2024 at 10:30 am and BSW on 03/02/2024 at 10:00 am  Olam Ally, MSW, LCSW [place]  [facility], [facility] Licensed Clinical Social Worker Direct Dial : [phone]

## 2024-02-24 NOTE — Patient Instructions (Signed)
 Visit Information  Thank you for taking time to visit with me today. Please don't hesitate to contact me if I can be of assistance to you before our next scheduled appointment.  Our next appointment is by telephone on 02/29/2024 at 10:30 am Please call the care guide team at [phone] if you need to cancel or reschedule your appointment.   Following is a copy of your care plan:   Goals Addressed             This Visit's Progress    VBCI Social Work Care Plan;LCSW       Problems:   Food Insecurity, housing and linkage to dentist. Patient is also requesting assistance with obtaining more person care assistant hours. CSW Clinical Goal(s):   Over the next 90 days the Patient will explore community resource options for unmet needs related to food Insecurity, housing and linkage to dentist. Patient is also requesting assistance with obtaining more person care assistant hours. Interventions:  Social Determinants of Health in Patient with DMII: SDOH assessments completed: Food Insecurity  and Housing  Evaluation of current treatment plan related to unmet needs BSW appointment made for 03/02/2024 at 10:00am Patient Care Assistant LCSW discussed patient's current agency and number of hours that he is receiving Patient reports 2 days a week for 2 hours. Patient states that he needs more hours. Patient expliand that PCP has already filled ut forms for the increase. LCSW explained that a call to [place] [facility] should occur and LCSW can assist and next visit.  Patient Goals/Self-Care Activities:  Collaborate with community resources for food Insecurity, housing and linkage to dentist. Patient is also requesting assistance with obtaining more person care assistant hours. Plan:   Telephone follow up appointment with care management team member scheduled for:  LCSW 02/29/2024 at 10:30am and BSW at 03/02/2024 at 10:00 am        Please call 911 if you are experiencing a Mental Health or Behavioral  Health Crisis or need someone to talk to.  Patient verbalizes understanding of instructions and care plan provided today and agrees to view in MyChart. Active MyChart status and patient understanding of how to access instructions and care plan via MyChart confirmed with patient.     Olam Ally, MSW, LCSW [place]  [facility], [facility] Licensed Clinical Social Worker Direct Dial : [phone]

## 2024-02-29 ENCOUNTER — Other Ambulatory Visit: Payer: Self-pay

## 2024-02-29 NOTE — Patient Outreach (Signed)
 Complex Care Management   Visit Note  02/29/2024  Name:  Todd Mccoy MRN: [id number] DOB: [birth year]  Situation: Referral received for Complex Care Management related to SDOH Barriers: Food Insecurity, housing and linkage to dentist. Patient is also requesting assistance with obtaining more person care assistant hours.  I obtained verbal consent from Patient.  Visit completed with Patient  on the phone. LCSW assisted patient with calling  [employer] and was informed that his medicaid eligibility was inactive. A return call would need to occur due to their system being down,. 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/2020   pt states recent MD visit /left eye worse than right   Dyslipidemia 12/10/2020   Hx of BKA, left (HCC)    Hypertension    Morbid obesity (HCC)    Osteomyelitis (HCC)    Pneumonia    Pulmonary HTN (HCC)    mild to moderate (PASP36mmHg) by echo 07/2023    Assessment: Patient Reported Symptoms:  Cognitive Cognitive Status: Normal speech and language skills, Alert and oriented to person, place, and time Cognitive/Intellectual Conditions Management [RPT]: None reported or documented in medical history or problem list      Neurological Neurological Review of Symptoms: Not assessed    HEENT HEENT Symptoms Reported: Not assessed      Cardiovascular Cardiovascular Symptoms Reported: Not assessed    Respiratory Respiratory Symptoms Reported: Not assesed    Endocrine  Endocrine Symptoms Reported: Not assessed    Gastrointestinal Gastrointestinal Symptoms Reported: Not assessed      Genitourinary Genitourinary Symptoms Reported: Not assessed    Integumentary Integumentary Symptoms Reported: Not assessed    Musculoskeletal Musculoskelatal Symptoms Reviewed: Not assessed        Psychosocial Psychosocial Symptoms Reported: Not assessed          02/29/2024    PHQ2-9 Depression Screening   Little interest or pleasure in doing things    Feeling down, depressed, or hopeless    PHQ-2 - Total Score    Trouble falling or staying asleep, or sleeping too much    Feeling tired or having little energy    Poor appetite or overeating     Feeling bad about yourself - or that you are a failure or have let yourself or your family down    Trouble concentrating on things, such as reading the newspaper or watching television    Moving or speaking so slowly that other people could have noticed.  Or the opposite - being so fidgety or restless that you have been moving around a lot more than usual    Thoughts that you would be better off dead, or hurting yourself in some way    PHQ2-9 Total Score    If you checked off any problems, how difficult have these problems made it for you to do your work, take care of things at home, or get along with other people    Depression  Interventions/Treatment      There were no vitals filed for this visit.  Medications Reviewed Today     Reviewed by Sherren Olam FORBES KEN (Social Worker) on 02/29/24 at 1046  Med List Status: <None>   Medication Order Taking? Sig Documenting Provider Last Dose Status Informant  amLODipine  (NORVASC ) 10 MG tablet [id number]  Take 1 tablet (10 mg total) by mouth daily. [name], [name], MD  Active   atorvastatin  (LIPITOR) 20 MG tablet [id number]  TAKE 1 TABLET (20 MG TOTAL) BY MOUTH DAILY. Wyn Jackee VEAR Mickey., NP  Active Family Member  Baclofen  5 MG TABS [id number]  Take 1 tablet (5 mg total) by mouth 2  (two) times daily as needed. Rising, Asberry, PA-C  Active   calcitRIOL  (ROCALTROL ) 0.25 MCG capsule [id number]  Take 1 capsule (0.25 mcg total) by mouth 2 (two) times daily.  Patient not taking: Reported on 12/29/2023   Shlomo Wilbert SAUNDERS, MD  Active   carvedilol  (COREG ) 25 MG tablet [id number]  Take 1 tablet (25 mg total) by mouth 2 (two) times daily with a meal. Turner, Wilbert SAUNDERS, MD  Active   docusate sodium  (COLACE) 100 MG capsule [id number]  Take 1 capsule (100 mg total) by mouth 2 (two) times daily as needed for mild constipation.  Patient not taking: Reported on 12/29/2023   Lue Elsie BROCKS, MD  Active Family Member  doxycycline  (VIBRAMYCIN ) 100 MG capsule [id number]  Take 1 capsule (100 mg total) by mouth 2 (two) times daily. Valdemar Rocky SAUNDERS, NP  Active   ferrous sulfate  325 (65 FE) MG EC tablet [id number]  Take 1 tablet (325 mg total) by mouth daily with breakfast. Danton Jon HERO, PA-C  Active   furosemide  (LASIX ) 40 MG tablet [id number]  Take 1 tablet (40 mg total) by mouth 2 (two) times daily. Danton Jon M, PA-C  Active   gabapentin  (NEURONTIN ) 300 MG capsule [id number]  Take 2 capsules by mouth twice daily [name], [name], MD  Active   gentamicin ointment (GARAMYCIN) 0.1 % [id number]  SMARTSIG:sparingly Topical [provider]  Active   hydrALAZINE  (APRESOLINE ) 50 MG tablet [id number]  Take 50 mg by mouth 3 (three) times daily. [provider]  Active Family Member  hydrOXYzine  (ATARAX ) 50 MG tablet [id number]  Take 1 tablet (50 mg total) by mouth 3 (three) times daily as needed for anxiety or itching.  Patient not taking: Reported on 02/24/2024   Danton Jon M, PA-C  Active   insulin  glargine (LANTUS ) 100 UNIT/ML injection [id number]  Inject 0.05 mLs (5 Units total) into the skin at bedtime. Danton Jon M, PA-C  Active   isosorbide  mononitrate (IMDUR ) 60 MG 24 hr tablet [id number]  Take 1 tablet (60 mg total) by mouth daily. Danton Jon M, PA-C  Active    levofloxacin (LEVAQUIN) 250 MG tablet [id number]  Take by mouth. [provider]  Active   lidocaine  (LIDODERM ) 5 % [id number]  Place 1 patch onto the skin daily. Remove & Discard patch within 12 hours or as directed by MD  Patient not taking: Reported on 02/24/2024   [name], [name], MD  Active   magnesium  oxide (MAG-OX) 400 (240 Mg) MG tablet [id number]  Take 1 tablet (400 mg total) by mouth 2 (two) times daily. Lue Elsie BROCKS, MD  Active Family Member  minocycline (MINOCIN) 100 MG capsule [id number]  Take 100 mg by mouth 2 (two) times daily.  Patient not taking: Reported on 02/24/2024   [provider]  Active  multivitamin (RENA-VIT) TABS tablet [id number]  Take 1 tablet by mouth at bedtime. Lue Elsie BROCKS, MD  Active Family Member  nortriptyline  (PAMELOR ) 25 MG capsule [id number]  Take 2 capsules (50 mg total) by mouth at bedtime. Start with 1 capsule at nighttime for 1 week, then if no side effects increase to 2 capsules at nighttime Emeline Search C, DO  Expired 02/24/24 2359   polyethylene glycol (MIRALAX  / GLYCOLAX ) 17 g packet [id number]  Take 17 g by mouth daily as needed for moderate constipation. Lue Elsie BROCKS, MD  Active Family Member  torsemide  (DEMADEX ) 20 MG tablet [id number]  Take 4 tablets (80 mg total) by mouth daily. Wyn Jackee VEAR Mickey., NP  Active Family Member            Recommendation:   Continue Current Plan of Care  Follow Up Plan:   Telephone follow-up 03/13/2024 at 1:00 PM  Olam Ally, MSW, LCSW [place]  [facility], [facility] Licensed Clinical Social Worker Direct Dial : [phone]

## 2024-02-29 NOTE — Patient Instructions (Signed)
 Visit Information  Thank you for taking time to visit with me today. Please don't hesitate to contact me if I can be of assistance to you before our next scheduled appointment.  Your next care management appointment is by telephone on 03/13/2024 at 1:00 PM  Please call the care guide team at [phone] if you need to cancel, schedule, or reschedule an appointment.   Please call 911 if you are experiencing a Mental Health or Behavioral Health Crisis or need someone to talk to.  Olam Ally, MSW, LCSW [place]  [facility], [facility] Licensed Clinical Social Worker Direct Dial : [phone]

## 2024-03-01 ENCOUNTER — Telehealth: Payer: Self-pay

## 2024-03-01 NOTE — Patient Outreach (Signed)
 LCSW called [place] [facility] back to follow up from previous call. LCSW spoke with [place] from [facility] and was informed that patient's current Medicaid does not cover PCS services and that is why the first referral was not process. LCSW will relay information to patient.  Olam Ally, MSW, LCSW [place]  [facility], [facility] Licensed Clinical Social Worker Direct Dial : [phone]  would need a new referral form.

## 2024-03-02 ENCOUNTER — Other Ambulatory Visit: Payer: Self-pay

## 2024-03-02 NOTE — Patient Instructions (Signed)
 Visit Information  Thank you for taking time to visit with me today. Please don't hesitate to contact me if I can be of assistance to you before our next scheduled appointment.  Our next appointment is by telephone on 03/16/2024 at 10AM Please call the care guide team at [phone] if you need to cancel or reschedule your appointment.   Following is a copy of your care plan:   Goals Addressed             This Visit's Progress    BSW VBCI Social Work Care Plan   On track    Problems:   Food Insecurity , Housing , and obtaining dental provider/care  CSW Clinical Goal(s):   Over the next 2 weeks the Patient will work with Child psychotherapist to address concerns related to food insecurity, housing, and dental provider/care needs..  Interventions:  BSW will provide community resources to pt for SDOH needs.   Patient Goals/Self-Care Activities:  Access food pantries and reach out to housing authorities re [address]  Plan:   Telephone follow up appointment with care management team member scheduled for:  03/16/2024 at 10AM.         Please call the Suicide and Crisis Lifeline: 988 go to [facility] [address] [place] [phone]) call 911 if you are experiencing a Mental Health or Behavioral Health Crisis or need someone to talk to.  Patient verbalizes understanding of instructions and care plan provided today and agrees to view in MyChart. Active MyChart status and patient understanding of how to access instructions and care plan via MyChart confirmed with patient.     Laymon Doll, BSW [place]/VBCI - [employer] Social Worker [phone]

## 2024-03-02 NOTE — Patient Outreach (Signed)
 Complex Care Management   Visit Note  03/02/2024  Name:  Todd Mccoy MRN: [id number] DOB: [birth year]  Situation: Referral received for Complex Care Management related to SDOH Barriers:  Housing wanting to move Food insecurity Finding dental provider/care needs I obtained verbal consent from Patient.  Visit completed with Patient  on the phone  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/2020   pt states recent MD visit /left eye worse than right   Dyslipidemia 12/10/2020   Hx of BKA, left (HCC)    Hypertension    Morbid obesity (HCC)    Osteomyelitis (HCC)    Pneumonia    Pulmonary HTN (HCC)    mild to moderate (PASP41mmHg) by echo 07/2023    Assessment: BSW held initial call with pt. Pt was alert and cognitive. SDOH needs were assessed and the following were identified: food insecurity, housing, and dental care needs. Pt reports he does not qualify for food stamps but receives FNS benefit for his daughter. Pt states it is not enough. Pt was receptive to food bank referrals and BSW will provide information via mail. Pt also expressed he wanted to move with his daughter into their own housing. Currently living at parent's house with mother, daughter, and niece. BSW will provide housing vacancy information along with different housing authority phone numbers. Pt expressed dental care needs, specifically cleaning and tooth extraction.   BSW and pt called [employer] medicare and were told his  current plan only covers routine care such as dental cleanings and a tooth extraction would not be covered. Pt was given phone # for [facility] 1: dental rider option to call and purchase additional coverage for a monthly rate ([phone]). Pt was also informed he has [employer] Ucard which can be used to buy fruits and veggies, over-the-counter- products and reloads at the start of each month. Pt understood. Pt and BSW agreed to call medicaid state office at next f/u to inquire about dental coverage for services outside of normal routine cleanings. No other resources provided at this time.   SDOH Interventions    Flowsheet Row Patient Outreach Telephone from 03/02/2024 in [place] POPULATION HEALTH DEPARTMENT Patient Outreach Telephone from 02/24/2024 in [facility] HEALTH POPULATION HEALTH DEPARTMENT Office Visit from 08/31/2023 in [facility] [place] - A Dept Of [place] Telephone from 03/09/2023 in [place] POPULATION HEALTH DEPARTMENT Telephone from 03/02/2023 in [place] POPULATION HEALTH DEPARTMENT Patient Outreach Telephone from 11/03/2022 in [place] POPULATION HEALTH DEPARTMENT  SDOH Interventions        Food Insecurity Interventions Community Resources Provided  [food resources will be mailed out to pt.] [employer] Provided  [LCSW will make referral to BSW] Intervention Not Indicated -- -- Intervention Not Indicated  Housing Interventions Community Resources Provided  [BSW provided patient different housing authorities contact information] Other (Comment)  [Patient is requesting assistance with resources to move] Intervention Not Indicated -- --  Intervention Not Indicated  Transportation Interventions -- Intervention Not Indicated Intervention Not Indicated -- Intervention Not Indicated --  Utilities Interventions Intervention Not Indicated Intervention Not Indicated Intervention Not Indicated Intervention Not Indicated -- --  Alcohol  Usage Interventions -- --  Intervention Not Indicated (Score <7) -- -- --  Financial Strain Interventions -- -- Intervention Not Indicated -- -- --  Physical Activity Interventions -- -- [employer] -- -- --  Stress Interventions -- -- Intervention Not Indicated -- -- --  Social Connections Interventions -- -- Intervention Not Indicated -- -- --  Health Literacy Interventions -- -- -- Other (Comment)  [Son assists] -- --      There were no vitals filed for this visit.  Medications Reviewed Today   Medications were not reviewed in this encounter     Recommendation:   Access food banks.   Follow Up Plan:   Telephone follow up appointment date/time:  03/16/2024 at 10AM   Laymon Doll, [place]/VBCI - [facility] Social Worker [phone]

## 2024-03-09 ENCOUNTER — Encounter (HOSPITAL_BASED_OUTPATIENT_CLINIC_OR_DEPARTMENT_OTHER): Attending: General Surgery | Admitting: General Surgery

## 2024-03-09 DIAGNOSIS — N186 End stage renal disease: Secondary | ICD-10-CM | POA: Diagnosis not present

## 2024-03-09 DIAGNOSIS — I5042 Chronic combined systolic (congestive) and diastolic (congestive) heart failure: Secondary | ICD-10-CM | POA: Insufficient documentation

## 2024-03-09 DIAGNOSIS — L97822 Non-pressure chronic ulcer of other part of left lower leg with fat layer exposed: Secondary | ICD-10-CM | POA: Diagnosis not present

## 2024-03-09 DIAGNOSIS — L97513 Non-pressure chronic ulcer of other part of right foot with necrosis of muscle: Secondary | ICD-10-CM | POA: Insufficient documentation

## 2024-03-09 DIAGNOSIS — E1122 Type 2 diabetes mellitus with diabetic chronic kidney disease: Secondary | ICD-10-CM | POA: Diagnosis not present

## 2024-03-09 DIAGNOSIS — E11622 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 ulcer: Secondary | ICD-10-CM | POA: Insufficient documentation

## 2024-03-09 DIAGNOSIS — E11621 Type 2 diabetes mellitus with foot ulcer: Secondary | ICD-10-CM | POA: Diagnosis present

## 2024-03-09 DIAGNOSIS — L97812 Non-pressure chronic ulcer of other part of right lower leg with fat layer exposed: Secondary | ICD-10-CM | POA: Diagnosis not present

## 2024-03-13 ENCOUNTER — Other Ambulatory Visit: Payer: Self-pay

## 2024-03-13 NOTE — Patient Outreach (Signed)
 Complex Care Management   Visit Note  03/13/2024  Name:  Todd Mccoy MRN: [id number] DOB: 1970/12/14  Situation: Referral received for Complex Care Management related to SDOH Barriers: Food Insecurity, housing and linkage to dentist. Patient is also requesting assistance with obtaining more person care assistant hours.  I obtained verbal consent from Patient.  Visit completed with Patient  on the phone. LCSW assisted patient with calling DSS to find out status of his eligibility however could not reach them. LCSW provided patient with contact information. 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/2020   pt states recent MD visit /left eye worse than right   Dyslipidemia 12/10/2020   Hx of BKA, left (HCC)    Hypertension    Morbid obesity (HCC)    Osteomyelitis (HCC)    Pneumonia    Pulmonary HTN (HCC)    mild to moderate (PASP33mmHg) by echo 07/2023    Assessment: Patient Reported Symptoms:  Cognitive Cognitive Status: Normal speech and language skills, Alert and oriented to person, place, and time Cognitive/Intellectual Conditions Management [RPT]: None reported or documented in medical history or problem list      Neurological Neurological Review of Symptoms: Not assessed    HEENT HEENT Symptoms Reported: Not assessed      Cardiovascular Cardiovascular Symptoms Reported: Not assessed    Respiratory Respiratory Symptoms Reported: Not assesed    Endocrine Endocrine Symptoms  Reported: Not assessed    Gastrointestinal Gastrointestinal Symptoms Reported: Not assessed      Genitourinary Genitourinary Symptoms Reported: Not assessed    Integumentary Integumentary Symptoms Reported: Not assessed    Musculoskeletal Musculoskelatal Symptoms Reviewed: Not assessed        Psychosocial Psychosocial Symptoms Reported: No symptoms reported          03/13/2024    PHQ2-9 Depression Screening   Little interest or pleasure in doing things    Feeling down, depressed, or hopeless    PHQ-2 - Total Score    Trouble falling or staying asleep, or sleeping too much    Feeling tired or having little energy    Poor appetite or overeating     Feeling bad about yourself - or that you are a failure or have let yourself or your family down    Trouble concentrating on things, such as reading the newspaper or watching television    Moving or speaking so slowly that other people could have noticed.  Or the opposite - being so fidgety or restless that you have been moving around a lot more than usual    Thoughts that you would be better off dead, or hurting yourself in some way    PHQ2-9 Total Score    If you checked off any problems, how difficult have these problems made it for you to do your work, take care of things at home, or get along with other people    Depression Interventions/Treatment  There were no vitals filed for this visit.  Medications Reviewed Today     Reviewed by Sherren Olam FORBES KEN (Social Worker) on 03/13/24 at 1104  Med List Status: <None>   Medication Order Taking? Sig Documenting Provider Last Dose Status Informant  amLODipine  (NORVASC ) 10 MG tablet [id number]  Take 1 tablet (10 mg total) by mouth daily. [name], [name], MD  Active   atorvastatin  (LIPITOR) 20 MG tablet [id number]  TAKE 1 TABLET (20 MG TOTAL) BY MOUTH DAILY. Wyn Jackee VEAR Mickey., NP  Active Family Member  Baclofen  5 MG TABS [id number]  Take 1 tablet (5 mg total) by mouth 2 (two) times  daily as needed. Rising, Asberry, PA-C  Active   calcitRIOL  (ROCALTROL ) 0.25 MCG capsule [id number]  Take 1 capsule (0.25 mcg total) by mouth 2 (two) times daily.  Patient not taking: Reported on 12/29/2023   Shlomo Wilbert SAUNDERS, MD  Active   carvedilol  (COREG ) 25 MG tablet [id number]  Take 1 tablet (25 mg total) by mouth 2 (two) times daily with a meal. Turner, Wilbert SAUNDERS, MD  Active   docusate sodium  (COLACE) 100 MG capsule [id number]  Take 1 capsule (100 mg total) by mouth 2 (two) times daily as needed for mild constipation.  Patient not taking: Reported on 12/29/2023   Lue Elsie BROCKS, MD  Active Family Member  doxycycline  (VIBRAMYCIN ) 100 MG capsule [id number]  Take 1 capsule (100 mg total) by mouth 2 (two) times daily. Valdemar Rocky SAUNDERS, NP  Active   ferrous sulfate  325 (65 FE) MG EC tablet [id number]  Take 1 tablet (325 mg total) by mouth daily with breakfast. Danton Jon HERO, PA-C  Active   furosemide  (LASIX ) 40 MG tablet [id number]  Take 1 tablet (40 mg total) by mouth 2 (two) times daily. Danton Jon M, PA-C  Active   gabapentin  (NEURONTIN ) 300 MG capsule [id number]  Take 2 capsules by mouth twice daily [name], [name], MD  Active   gentamicin ointment (GARAMYCIN) 0.1 % [id number]  SMARTSIG:sparingly Topical [provider]  Active   hydrALAZINE  (APRESOLINE ) 50 MG tablet [id number]  Take 50 mg by mouth 3 (three) times daily. [provider]  Active Family Member  hydrOXYzine  (ATARAX ) 50 MG tablet [id number]  Take 1 tablet (50 mg total) by mouth 3 (three) times daily as needed for anxiety or itching.  Patient not taking: Reported on 02/24/2024   Danton Jon M, PA-C  Active   insulin  glargine (LANTUS ) 100 UNIT/ML injection [id number]  Inject 0.05 mLs (5 Units total) into the skin at bedtime. Danton Jon M, PA-C  Active   isosorbide  mononitrate (IMDUR ) 60 MG 24 hr tablet [id number]  Take 1 tablet (60 mg total) by mouth daily. Danton Jon M, PA-C  Active   levofloxacin  (LEVAQUIN) 250 MG tablet [id number]  Take by mouth. [provider]  Active   lidocaine  (LIDODERM ) 5 % [id number]  Place 1 patch onto the skin daily. Remove & Discard patch within 12 hours or as directed by MD  Patient not taking: Reported on 02/24/2024   [name], [name], MD  Active   magnesium  oxide (MAG-OX) 400 (240 Mg) MG tablet [id number]  Take 1 tablet (400 mg total) by mouth 2 (two) times daily. Lue Elsie BROCKS, MD  Active Family Member  minocycline (MINOCIN) 100 MG capsule [id number]  Take 100 mg by mouth 2 (two) times daily.  Patient not taking: Reported on 02/24/2024   [provider]  Active   multivitamin (RENA-VIT) TABS tablet [id number]  Take 1 tablet by mouth at bedtime. Lue Elsie BROCKS, MD  Active Family Member  nortriptyline  (PAMELOR ) 25 MG capsule [id number]  Take 2 capsules (50 mg total) by mouth at bedtime. Start with 1 capsule at nighttime for 1 week, then if no side effects increase to 2 capsules at nighttime Emeline Search C, DO  Expired 02/24/24 2359   polyethylene glycol (MIRALAX  / GLYCOLAX ) 17 g packet [id number]  Take 17 g by mouth daily as needed for moderate constipation. Lue Elsie BROCKS, MD  Active Family Member  torsemide  (DEMADEX ) 20 MG tablet [id number]  Take 4 tablets (80 mg total) by mouth daily. Wyn Jackee VEAR Mickey., NP  Active Family Member            Recommendation:   Continue Current Plan of Care  Follow Up Plan:   Telephone follow-up 03/22/2024 at 11:30 am  Olam Ally, MSW, LCSW [place]  [facility], [facility] Licensed Clinical Social Worker Direct Dial : [phone]

## 2024-03-13 NOTE — Patient Instructions (Signed)
    Visit Information  Thank you for taking time to visit with me today. Please don't hesitate to contact me if I can be of assistance to you before our next scheduled appointment.  Your next care management appointment is by telephone on 03/22/2024 at 11:30     Please call the care guide team at [phone] if you need to cancel, schedule, or reschedule an appointment.   Please call 911 if you are experiencing a Mental Health or Behavioral Health Crisis or need someone to talk to. Olam Ally, MSW, LCSW [place]  [facility], [facility] Licensed Clinical Social Worker Direct Dial : [phone]

## 2024-03-16 ENCOUNTER — Other Ambulatory Visit: Payer: Self-pay

## 2024-03-16 NOTE — Patient Outreach (Signed)
 Complex Care Management   Visit Note  03/16/2024  Name:  Todd Mccoy MRN: [id number] DOB: [birth year]  Situation: Referral received for Complex Care Management related to SDOH Barriers:  Food insecurity Finding dental care provider I obtained verbal consent from Patient.  Visit completed with Patient  on the phone  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/2020   pt states recent MD visit /left eye worse than right   Dyslipidemia 12/10/2020   Hx of BKA, left (HCC)    Hypertension    Morbid obesity (HCC)    Osteomyelitis (HCC)    Pneumonia    Pulmonary HTN (HCC)    mild to moderate (PASP76mmHg) by echo 07/2023    Assessment: BSW held f/u appt with pt. Pt was alert and cognitive. Pt confirmed he has not received mailed resources yet. BSW followed up with Leita Lyme re resources sent and she stated resources were picked up on 09/03 and should be arriving soon. BSW will f/u with pt at next appt if he received resources. Pt understood and  agreed.   BSW and pt called [facility] [phone]) and spoke with agent Gipsy (reference ID: [id number]) re medicaid dental coverage. Pt was informed he has full coverage through [facility] Direct. Pt was advised of his covered benefits and co-pays. Agent Gipsy confirmed dental cleanings, fillings/extractions are covered. BSW will provide pt a list of dental providers in his zip code that accept Medicaid Direct and new patients via mail.  Additionally, BSW will provide out-of-the-garden food project distribution schedule for food insecurity. Pt was explained how program works and how to access food through them. No other resources were provided/requested at this time.   SDOH Interventions    Flowsheet Row Patient Outreach Telephone from 03/02/2024 in [place] POPULATION HEALTH DEPARTMENT Patient Outreach Telephone from 02/24/2024 in [facility] HEALTH POPULATION HEALTH DEPARTMENT Office Visit from 08/31/2023 in [facility] Health Comm Health [place] - A Dept Of [place]. [facility] Telephone from 03/09/2023 in [place] POPULATION HEALTH DEPARTMENT Telephone from 03/02/2023 in [place] POPULATION HEALTH DEPARTMENT Patient Outreach Telephone from 11/03/2022 in [place] POPULATION HEALTH DEPARTMENT  SDOH Interventions        Food Insecurity Interventions Community Resources Provided  [food resources will be mailed out to pt.] [employer] Provided  [employer] will make referral to BSW] Intervention Not Indicated -- -- Intervention Not Indicated  Housing Interventions Community Resources Provided  [BSW provided patient different housing authorities contact information] Other (Comment)  [Patient is requesting assistance with resources to move] Intervention Not Indicated -- -- Intervention Not Indicated  Transportation Interventions -- Intervention Not Indicated Intervention Not Indicated -- Intervention Not Indicated --  Utilities Interventions Intervention Not Indicated Intervention Not Indicated Intervention Not Indicated Intervention Not Indicated -- --  Alcohol  Usage Interventions -- -- Intervention Not Indicated (Score <7) -- -- --  Financial Strain Interventions -- -- Intervention Not Indicated -- -- --  Physical Activity Interventions -- -- Local [employer] -- -- --  Stress Interventions -- -- Intervention Not Indicated -- -- --  Social Connections Interventions -- -- Intervention Not Indicated -- -- --  Health Literacy Interventions --  -- -- Other (Comment)  [Son assists] -- --      Recommendation:   Review mailed resource upon receiving them.    Follow Up Plan:   Telephone follow up appointment date/time:  03/30/2024 at 11:15AM   Todd Mccoy, BSW /VBCI - [facility] Social Worker [phone]

## 2024-03-16 NOTE — Patient Instructions (Signed)
 Visit Information  Thank you for taking time to visit with me today. Please don't hesitate to contact me if I can be of assistance to you before our next scheduled appointment.  Your next care management appointment is by telephone on 03/30/2024 at 11:15AM  Telephone follow up appointment date/time:  03/30/2024 at 11:15AM  Please call the care guide team at [phone] if you need to cancel, schedule, or reschedule an appointment.   Please call the Suicide and Crisis Lifeline: 988 go to [facility] Urgent [facility] [address] [place] [phone]) call 911 if you are experiencing a Mental Health or Behavioral Health Crisis or need someone to talk to.  Laymon Doll, BSW [place]/VBCI - [employer] Social Worker [phone]

## 2024-03-22 ENCOUNTER — Other Ambulatory Visit: Payer: Self-pay

## 2024-03-22 NOTE — Patient Outreach (Signed)
 Complex Care Management   Visit Note  03/22/2024  Name:  ASENCION LOVEDAY MRN: [id number] DOB: May 08, 1971  Situation: Referral received for Complex Care Management related to SDOH Barriers: Food Insecurity, housing and linkage to dentist. Patient is also requesting assistance with obtaining more person care assistant hours.  I obtained verbal consent from Patient.  Visit completed with Patient  on the phone. LCSW obtained DSS worker contact information and attempted to call however had to leave voice message. Patient reports that he is unable to reach DSS as well. Patient has been provided SDOH resources and Writer. 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/2020   pt states recent MD visit /left eye worse than right   Dyslipidemia 12/10/2020   Hx of BKA, left (HCC)    Hypertension    Morbid obesity (HCC)    Osteomyelitis (HCC)    Pneumonia    Pulmonary HTN (HCC)    mild to moderate (PASP23mmHg) by echo 07/2023    Assessment: Patient Reported Symptoms:  Cognitive Cognitive Status: Normal speech and language skills, Alert and oriented to person, place, and time Cognitive/Intellectual Conditions Management [RPT]: None reported or documented in medical history or problem list      Neurological Neurological Review of Symptoms: Not assessed    HEENT HEENT Symptoms Reported: Not assessed      Cardiovascular Cardiovascular Symptoms Reported: Not assessed    Respiratory  Respiratory Symptoms Reported: Not assesed    Endocrine Endocrine Symptoms Reported: Not assessed    Gastrointestinal Gastrointestinal Symptoms Reported: Not assessed      Genitourinary Genitourinary Symptoms Reported: Not assessed    Integumentary Integumentary Symptoms Reported: Not assessed    Musculoskeletal Musculoskelatal Symptoms Reviewed: Difficulty walking Additional Musculoskeletal Details: 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 Musculoskeletal Management Strategies: Routine screening Falls in the past year?: Yes Number of falls in past year: 2 or more Was there an injury with Fall?: Yes Fall Risk Category Calculator: 3 Patient Fall Risk Level: High Fall Risk Patient at Risk for Falls Due to: History of fall(s) Fall risk Follow up: Falls evaluation completed  Psychosocial Psychosocial Symptoms Reported: No symptoms reported Additional Psychological Details: History of anxiety over a year ago          03/22/2024    PHQ2-9 Depression Screening   Little interest or pleasure in doing things    Feeling down, depressed, or hopeless    PHQ-2 - Total Score    Trouble falling or staying asleep, or sleeping too much    Feeling tired or having little energy    Poor appetite or overeating     Feeling bad about yourself - or that you are a failure or have let yourself or your family down    Trouble concentrating on things, such as reading the newspaper or watching television    Moving or speaking so slowly that other people could have noticed.  Or the opposite -  being so fidgety or restless that you have been moving around a lot more than usual    Thoughts that you would be better off dead, or hurting yourself in some way    PHQ2-9 Total Score    If you checked off any problems, how difficult have these problems made it for you to do your work, take care of things at home, or get along with other people    Depression Interventions/Treatment      There were no vitals filed  for this visit.  Medications Reviewed Today     Reviewed by Sherren Olam FORBES KEN (Social Worker) on 03/22/24 at 1140  Med List Status: <None>   Medication Order Taking? Sig Documenting Provider Last Dose Status Informant  amLODipine  (NORVASC ) 10 MG tablet [id number]  Take 1 tablet (10 mg total) by mouth daily. [name], [name], MD  Active   atorvastatin  (LIPITOR) 20 MG tablet [id number]  TAKE 1 TABLET (20 MG TOTAL) BY MOUTH DAILY. Wyn Jackee VEAR Mickey., NP  Active Family Member  Baclofen  5 MG TABS [id number]  Take 1 tablet (5 mg total) by mouth 2 (two) times daily as needed. Rising, [place], PA-C  Active   calcitRIOL  (ROCALTROL ) 0.25 MCG capsule [id number]  Take 1 capsule (0.25 mcg total) by mouth 2 (two) times daily.  Patient not taking: Reported on 12/29/2023   Shlomo Wilbert SAUNDERS, MD  Active   carvedilol  (COREG ) 25 MG tablet [id number]  Take 1 tablet (25 mg total) by mouth 2 (two) times daily with a meal. Turner, Wilbert SAUNDERS, MD  Active   docusate sodium  (COLACE) 100 MG capsule [id number]  Take 1 capsule (100 mg total) by mouth 2 (two) times daily as needed for mild constipation.  Patient not taking: Reported on 12/29/2023   Lue Elsie BROCKS, MD  Active Family Member  doxycycline  (VIBRAMYCIN ) 100 MG capsule [id number]  Take 1 capsule (100 mg total) by mouth 2 (two) times daily. Valdemar Rocky SAUNDERS, NP  Active   ferrous sulfate  325 (65 FE) MG EC tablet [id number]  Take 1 tablet (325 mg total) by mouth daily with breakfast. Danton Jon HERO, PA-C  Active   furosemide  (LASIX ) 40 MG tablet [id number]  Take 1 tablet (40 mg total) by mouth 2 (two) times daily. Danton Jon M, PA-C  Active   gabapentin  (NEURONTIN ) 300 MG capsule [id number]  Take 2 capsules by mouth twice daily [name], [name], MD  Active   gentamicin ointment (GARAMYCIN) 0.1 % [id number]  SMARTSIG:sparingly Topical [provider]  Active   hydrALAZINE  (APRESOLINE ) 50 MG tablet [id number]  Take 50 mg by mouth 3 (three) times daily. [provider]  Active Family Member  hydrOXYzine  (ATARAX ) 50 MG tablet [id number]  Take 1 tablet (50 mg total) by mouth 3 (three) times daily as needed for anxiety or itching.  Patient not taking: Reported on 02/24/2024   Danton Jon M, PA-C  Active   insulin  glargine (LANTUS ) 100 UNIT/ML injection [id number]  Inject 0.05 mLs (5 Units total) into the skin at bedtime. Danton Jon M, PA-C  Active   isosorbide  mononitrate (IMDUR ) 60 MG 24 hr tablet [id number]  Take 1 tablet (60 mg total) by mouth daily. Danton Jon M, PA-C  Active   levofloxacin (LEVAQUIN) 250 MG tablet [id number]  Take by mouth. [provider]  Active   lidocaine  (LIDODERM ) 5 % [id number]  Place 1 patch onto the skin daily. Remove & Discard patch within 12 hours or as directed by MD  Patient  not taking: Reported on 02/24/2024   [name], [name], MD  Active   magnesium  oxide (MAG-OX) 400 (240 Mg) MG tablet [id number]  Take 1 tablet (400 mg total) by mouth 2 (two) times daily. Lue Elsie BROCKS, MD  Active Family Member  minocycline (MINOCIN) 100 MG capsule [id number]  Take 100 mg by mouth 2 (two) times daily.  Patient not taking: Reported on 02/24/2024   [provider]  Active   multivitamin (RENA-VIT) TABS tablet [id number]  Take 1 tablet by mouth at bedtime. Lue Elsie BROCKS, MD  Active Family Member  nortriptyline  (PAMELOR ) 25 MG capsule [id number]  Take 2 capsules (50 mg total) by mouth at bedtime. Start with 1 capsule at nighttime for 1 week, then if no side effects increase to 2 capsules at nighttime Emeline Search C, DO  Expired 02/24/24 2359   polyethylene glycol (MIRALAX  / GLYCOLAX ) 17 g packet [id number]  Take 17 g by mouth daily as needed for moderate constipation. Lue Elsie BROCKS, MD  Active Family Member  torsemide  (DEMADEX ) 20 MG tablet [id number]  Take 4 tablets (80 mg total) by mouth daily. Wyn Jackee VEAR Mickey., NP  Active Family Member            Recommendation:   Continue Current  Plan of Care  Follow Up Plan:   Telephone follow-up 04/10/2024 at 11:30 am  Olam Ally, MSW, LCSW   [facility], [facility] Licensed Clinical Social Worker Direct Dial : [phone]

## 2024-03-22 NOTE — Patient Instructions (Signed)
 Visit Information  Thank you for taking time to visit with me today. Please don't hesitate to contact me if I can be of assistance to you before our next scheduled appointment.  Your next care management appointment is by telephone on 04/10/2024 at 11:30 am   Please call the care guide team at [phone] if you need to cancel, schedule, or reschedule an appointment.   Please call 911 if you are experiencing a Mental Health or Behavioral Health Crisis or need someone to talk to.  Olam Ally, MSW, LCSW [place]  [facility], [facility] Licensed Clinical Social Worker Direct Dial : [phone]

## 2024-03-23 ENCOUNTER — Encounter (HOSPITAL_BASED_OUTPATIENT_CLINIC_OR_DEPARTMENT_OTHER): Admitting: General Surgery

## 2024-03-23 DIAGNOSIS — E11621 Type 2 diabetes mellitus with foot ulcer: Secondary | ICD-10-CM | POA: Diagnosis not present

## 2024-03-27 ENCOUNTER — Other Ambulatory Visit: Payer: Self-pay | Admitting: Family Medicine

## 2024-03-27 DIAGNOSIS — Z89512 Acquired absence of left leg below knee: Secondary | ICD-10-CM

## 2024-03-27 DIAGNOSIS — I5023 Acute on chronic systolic (congestive) heart failure: Secondary | ICD-10-CM

## 2024-03-27 NOTE — Telephone Encounter (Unsigned)
 Copied from CRM #[id number]. Topic: Clinical - Medication Refill >> Mar 27, 2024  5:17 PM [name] wrote: Medication: furosemide  (LASIX ) 40 MG tablet  Has the patient contacted their pharmacy? Yes  This is the patient's preferred pharmacy:  [facility] - [place] ([place] - [address] [place] ([place]) [place] [address] Phone: [phone] Fax: [phone]  Is this the correct pharmacy for this prescription? Yes   Has the prescription been filled recently? No  Is the patient out of the medication? Yes  Has the patient been seen for an appointment in the last year OR does the patient have an upcoming appointment? Yes  Can we respond through MyChart? No  Agent: Please be advised that Rx refills may take up to 3 business days. We ask that you follow-up with your pharmacy.

## 2024-03-29 MED ORDER — FUROSEMIDE 40 MG PO TABS: 40.0000 mg | ORAL_TABLET | Freq: Two times a day (BID) | ORAL | 0 refills | Status: DC

## 2024-03-29 NOTE — Telephone Encounter (Signed)
 Requested Prescriptions  Pending Prescriptions Disp Refills   furosemide  (LASIX ) 40 MG tablet 180 tablet 0    Sig: Take 1 tablet (40 mg total) by mouth 2 (two) times daily.     Cardiovascular:  Diuretics - Loop Failed - 03/29/2024 10:48 AM      Failed - Ca in normal range and within 180 days    Calcium   Date Value Ref Range Status  12/23/2023 8.1 (L) 8.9 - 10.3 mg/dL Final   Calcium , Total (PTH)  Date Value Ref Range Status  02/08/2023 7.5 (L) 8.7 - 10.2 mg/dL Final   Calcium , Ion  Date Value Ref Range Status  12/23/2023 0.99 (L) 1.15 - 1.40 mmol/L Final         Failed - Cr in normal range and within 180 days    Creatinine, Ser  Date Value Ref Range Status  12/23/2023 5.71 (H) 0.61 - 1.24 mg/dL Final   Creatinine, Urine  Date Value Ref Range Status  02/27/2021 131.25 mg/dL Final         Failed - Cl in normal range and within 180 days    Chloride  Date Value Ref Range Status  12/23/2023 96 (L) 98 - 111 mmol/L Final         Failed - Mg Level in normal range and within 180 days    Magnesium   Date Value Ref Range Status  02/14/2023 1.5 (L) 1.7 - 2.4 mg/dL Final    Comment:    Performed at [facility], [address] [place], [name] [address]         Failed - Last BP in normal range    BP Readings from Last 1 Encounters:  12/29/23 (!) 161/88         Passed - K in normal range and within 180 days    Potassium  Date Value Ref Range Status  12/23/2023 4.4 3.5 - 5.1 mmol/L Final         Passed - Na in normal range and within 180 days    Sodium  Date Value Ref Range Status  12/23/2023 138 135 - 145 mmol/L Final  09/13/2023 137 134 - 144 mmol/L Final         Passed - Valid encounter within last 6 months    Recent Outpatient Visits           3 months ago Type 2 diabetes mellitus with hyperglycemia, with long-term current use of insulin  (HCC)   [place] [facility] [place]. [facility] Delbert Clam, MD   7 months ago  Type 2 diabetes mellitus with hyperglycemia, with long-term current use of insulin  [facility])   [place] [facility] - [facility] Dept Of [place]. [facility] [place], [place], [place]   1 year ago 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 complication, unspecified whether long term insulin  use (HCC)   [place] [facility] - [facility] Dept Of [place]. [facility] Delbert Clam, MD   2 years ago 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 complication, unspecified whether long term insulin  use (HCC)   [place] [facility] - [facility] Dept Of [place]. [facility] Delbert Clam, MD   3 years ago Other insomnia   [place] [facility] [place] [facility] Of [place]. [facility] Delbert Clam, MD

## 2024-03-29 NOTE — Telephone Encounter (Unsigned)
 Copied from CRM #[id number]. Topic: Clinical - Medication Refill >> Mar 29, 2024  2:27 PM Kendralyn S wrote: Medication:  insulin  glargine (LANTUS ) 100 UNIT/ML injection pen / not vial   Has the patient contacted their pharmacy? Yes (Agent: If no, request that the patient contact the pharmacy for the refill. If patient does not wish to contact the pharmacy document the reason why and proceed with request.) (Agent: If yes, when and what did the pharmacy advise?)  This is the patient's preferred pharmacy:   [facility] - [place] ([place]), [place] - [address]  ([place]) [place] [address] Phone: [phone] Fax: [phone]  Is this the correct pharmacy for this prescription? Yes If no, delete pharmacy and type the correct one.   Has the prescription been filled recently? No  Is the patient out of the medication? Yes  Has the patient been seen for an appointment in the last year OR does the patient have an upcoming appointment? Yes  Can we respond through MyChart? Yes  Agent: Please be advised that Rx refills may take up to 3 business days. We ask that you follow-up with your pharmacy.

## 2024-03-30 ENCOUNTER — Other Ambulatory Visit: Payer: Self-pay

## 2024-03-30 NOTE — Patient Outreach (Signed)
 Complex Care Management   Visit Note  03/30/2024  Name:  Todd Mccoy MRN: [id number] DOB: [birth year]  Situation: Referral received for Complex Care Management related to SDOH Barriers:  Food insecurity Finding dental care I obtained verbal consent from Patient.  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 Pt confirmed he has not received any mailed resources from [place]. BSW reached out to Leita Lyme and confirmed resources were sent and no returned mail was on file for this pt. BSW will resend all resources to pt and Leita confirmed they will be printed and mailed again on Monday's mail. Pt confirmed no other resources needed at this time.   SDOH Interventions    Flowsheet Row Patient Outreach Telephone from 03/02/2024 in [place] POPULATION HEALTH DEPARTMENT Patient Outreach Telephone from 02/24/2024 in [facility] HEALTH POPULATION HEALTH DEPARTMENT Office Visit from 08/31/2023 in [name] [place] - [facility] Of [place]. [facility] Telephone from 03/09/2023 in [place] POPULATION HEALTH DEPARTMENT Telephone from 03/02/2023 in  [place] POPULATION HEALTH DEPARTMENT Patient Outreach Telephone from 11/03/2022 in [place] POPULATION HEALTH DEPARTMENT  SDOH Interventions        Food Insecurity Interventions Community Resources Provided  [food resources will be mailed out to pt.] [employer] Provided  [employer] will make referral to BSW] Intervention Not Indicated -- -- Intervention Not Indicated  Housing Interventions Community Resources Provided  [BSW provided patient different housing authorities contact information] Other (Comment)  [Patient is requesting assistance with resources to move] Intervention Not Indicated -- -- Intervention Not Indicated  Transportation Interventions -- Intervention Not Indicated Intervention Not Indicated -- Intervention Not Indicated --  Utilities Interventions Intervention Not Indicated Intervention Not Indicated Intervention Not Indicated Intervention Not Indicated -- --  Alcohol  Usage Interventions -- -- Intervention Not Indicated (Score <7) -- -- --  Financial Strain Interventions -- -- Intervention Not Indicated -- -- --  Physical Activity Interventions -- -- Local [employer] -- -- --  Stress Interventions -- -- Intervention Not Indicated -- -- --  Social Connections Interventions -- -- Intervention Not Indicated -- -- --  Health Literacy Interventions -- -- -- Other (Comment)  [Son assists] -- --    Recommendation:   Review all resources mailed once received.   Follow Up Plan:   Telephone follow up appointment date/time:  04/13/2024 at 10:30AM.  Laymon Doll, BSW [place]/[facility] - [facility] Social Worker [phone]

## 2024-03-30 NOTE — Patient Instructions (Signed)
 Visit Information  Thank you for taking time to visit with me today. Please don't hesitate to contact me if I can be of assistance to you before our next scheduled appointment.  Your next care management appointment is by telephone on 04/13/2024 at 1030AM  Telephone follow up appointment date/time:  04/13/2024 at 10:30AM.  Please call the care guide team at [phone] if you need to cancel, schedule, or reschedule an appointment.   Please call the Suicide and Crisis Lifeline: 988 go to [facility]gent [facility] [address], [place] [phone]) call 911 if you are experiencing a Mental Health or Behavioral Health Crisis or need someone to talk to.  Laymon Doll, BSW [place]/[facility]I - [employer] Social Worker [phone]

## 2024-04-04 ENCOUNTER — Other Ambulatory Visit (HOSPITAL_COMMUNITY): Payer: Self-pay | Admitting: Nephrology

## 2024-04-04 ENCOUNTER — Other Ambulatory Visit: Payer: Self-pay | Admitting: Family Medicine

## 2024-04-04 DIAGNOSIS — R188 Other ascites: Secondary | ICD-10-CM

## 2024-04-04 DIAGNOSIS — Z794 Long term (current) use of insulin: Secondary | ICD-10-CM

## 2024-04-04 NOTE — Telephone Encounter (Unsigned)
 Copied from CRM #[id number]. 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 yes  Has the prescription been filled recently? Yes but for vial not the pen  Is the patient out of the medication? yes  Has the patient been seen for an appointment in the last year OR does the patient have an upcoming appointment? yes  Can we respond through MyChart? yes  Agent: Please be advised that Rx refills may take up to 3 business days. We ask that you follow-up with your pharmacy.

## 2024-04-04 NOTE — Telephone Encounter (Unsigned)
 Copied from CRM #[id number]. 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 yes  Has the prescription been filled recently? Yes but for vial not the pen  Is the patient out of the medication? yes  Has the patient been seen for an appointment in the last year OR does the patient have an upcoming appointment? yes  Can we respond through MyChart? {yes/no:[id number]}  Agent: Please be advised that Rx refills may take up to 3 business days. We ask that you follow-up with your pharmacy.

## 2024-04-04 NOTE — Telephone Encounter (Signed)
 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

## 2024-04-05 NOTE — Telephone Encounter (Signed)
 Requested medications are due for refill today.  See pt note  Requested medications are on the active medications list.  yes  Last refill. Not picked up  Future visit scheduled.   no  Notes to clinic.   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     Endocrinology:  Diabetes - Insulins Passed - 04/05/2024  5:30 PM      Passed - HBA1C is between 0 and 7.9 and within 180 days    HbA1c, POC (controlled diabetic range)  Date Value Ref Range Status  12/29/2023 6.8 0.0 - 7.0 % Final         Passed - Valid encounter within last 6 months    Recent Outpatient Visits           3 months ago Type 2 diabetes mellitus with hyperglycemia, with long-term current use of insulin  (HCC)   [place] [facility] [place]. [facility] Delbert Clam, MD   7 months ago Type 2 diabetes mellitus with hyperglycemia, with long-term current use of insulin  [facility])   [place] [facility] Dept Of [place]. [facility] [place], [place], [place]   1 year ago 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 complication, unspecified whether long term insulin  use [facility])   [place] [facility] Dept Of [place]. [facility] Delbert Clam, MD   2 years ago 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 complication, unspecified whether long term insulin  use (HCC)   [place] [facility] - A Dept Of . [facility] Delbert Clam, MD   3 years ago Other insomnia    [facility] [place] - A Dept Of . [facility] Delbert Clam, MD       Future Appointments             In 5 days Valdemar Rocky SAUNDERS, NP [facility]

## 2024-04-06 ENCOUNTER — Ambulatory Visit (HOSPITAL_BASED_OUTPATIENT_CLINIC_OR_DEPARTMENT_OTHER): Admitting: General Surgery

## 2024-04-06 MED ORDER — INSULIN GLARGINE 100 UNIT/ML ~~LOC~~ SOLN: 5.0000 [IU] | Freq: Every day | SUBCUTANEOUS | 1 refills | Status: DC

## 2024-04-10 ENCOUNTER — Encounter: Payer: Self-pay | Admitting: Family

## 2024-04-10 ENCOUNTER — Ambulatory Visit: Admitting: Family

## 2024-04-10 ENCOUNTER — Other Ambulatory Visit: Payer: Self-pay

## 2024-04-10 DIAGNOSIS — I872 Venous insufficiency (chronic) (peripheral): Secondary | ICD-10-CM | POA: Diagnosis not present

## 2024-04-10 DIAGNOSIS — L98499 Non-pressure chronic ulcer of skin of other sites with unspecified severity: Secondary | ICD-10-CM | POA: Diagnosis not present

## 2024-04-10 DIAGNOSIS — R600 Localized edema: Secondary | ICD-10-CM | POA: Diagnosis not present

## 2024-04-10 DIAGNOSIS — Z89512 Acquired absence of left leg below knee: Secondary | ICD-10-CM

## 2024-04-10 NOTE — Patient Outreach (Signed)
 LCSW called patient for visit and was unable to reach him. LCSW left voice mail informing patient that another visit has been scheduled for 04/24/2024 at 11:30 am.  Olam Ally, MSW, LCSW [place]  [facility], [facility] Licensed Clinical Social Worker Direct Dial : [phone]

## 2024-04-10 NOTE — Patient Instructions (Signed)
 Visit Information  MILO SCHREIER - I am sorry I was unable to reach you today for our scheduled appointment. I work with [name], [name], MD and am calling to support your healthcare needs. Please contact me at [phone] at your earliest convenience. I look forward to speaking with you soon.    Your next care management appointment is by telephone on 04/24/2024  at 11:30 am   Please call the care guide team at [phone] if you need to cancel, schedule, or reschedule an appointment.   Please call 911 if you are experiencing a Mental Health or Behavioral Health Crisis or need someone to talk to. Olam Ally, MSW, LCSW [place]  [facility], [facility] Health Licensed Clinical Social Worker Direct Dial : [phone]

## 2024-04-10 NOTE — Progress Notes (Signed)
 Office Visit Note   Patient: Todd Mccoy           Date of Birth: [birth year]           MRN: [id number] Visit Date: 04/10/2024              Requested by: Delbert Clam, MD [address] [employer] [address] [place],  [name] [address] PCP: Delbert Clam, MD  Chief Complaint  Patient presents with   Left Leg - Follow-up    Hx left BKA, needs Rx for shrinkers      HPI: The patient is a 53 year old gentleman who is seen status post left below-knee amputation.  He has increased edema to the left lower extremity the left residual limb appears to be dependent the majority of the day.  He currently does not have any shrinkers that are in good repair  Assessment & Plan: Visit Diagnoses: No diagnosis found.  Plan: Given an order for new shrinkers he will follow-up with [facility] for shrinkers follow-up with us  as needed  Follow-Up Instructions: No follow-ups on file.   Ortho Exam  Patient is alert, oriented, no adenopathy, well-dressed, normal affect, normal respiratory effort. On examination left residual limb he does have increased volume this morning.  There is no weeping.    Imaging: No results found. No images are attached to the encounter.  Labs: Lab Results  Component Value Date   HGBA1C 6.8 12/29/2023   HGBA1C 7.0 08/31/2023   HGBA1C 5.6 02/06/2023   ESRSEDRATE 134 (H) 12/10/2020   ESRSEDRATE 126 (H) 03/24/2020   CRP 29.8 (H) 12/10/2020   LABURIC 9.1 (H) 12/15/2020   REPTSTATUS 10/22/2023 FINAL 10/19/2023   GRAMSTAIN NO WBC SEEN NO ORGANISMS SEEN  10/19/2023   CULT  10/19/2023    NO GROWTH 3 DAYS Performed at [facility], [address], [place], [name] [address]    [facility] STAPHYLOCOCCUS AUREUS 09/08/2021     Lab Results  Component Value Date   ALBUMIN  2.5 (L) 12/23/2023   ALBUMIN  3.2 (L) 12/21/2023   ALBUMIN  3.0 (L) 09/21/2023   PREALBUMIN 5.5 (L) 12/10/2020    Lab Results  Component Value Date   MG 1.5 (L) 02/14/2023   MG 1.4  (L) 02/13/2023   MG 1.7 02/12/2023   Lab Results  Component Value Date   VD25OH 10.5 (L) 08/31/2023    Lab Results  Component Value Date   PREALBUMIN 5.5 (L) 12/10/2020      Latest Ref Rng & Units 12/23/2023    5:11 PM 12/23/2023    4:30 PM 12/21/2023   10:40 AM  CBC EXTENDED  WBC 4.0 - 10.5 K/uL  3.7  3.4   RBC 4.22 - 5.81 MIL/uL  3.88  3.76   Hemoglobin 13.0 - 17.0 g/dL 87.3  88.8  89.2   HCT 39.0 - 52.0 % 37.0  36.4  33.7   Platelets 150 - 400 K/uL  105  102.0   NEUT# 1.7 - 7.7 K/uL  2.2  2.4   Lymph# 0.7 - 4.0 K/uL  0.8  0.5      There is no height or weight on file to calculate BMI.  Orders:  No orders of the defined types were placed in this encounter.  No orders of the defined types were placed in this encounter.    Procedures: No procedures performed  Clinical Data: No additional findings.  ROS:  All other systems negative, except as noted in the HPI. Review of Systems  Objective: Vital Signs: There were no vitals taken for this visit.  Specialty Comments:  No specialty comments available.  PMFS History: Patient Active Problem List   Diagnosis Date Noted   Elevated alkaline phosphatase level 12/21/2023   Elevated LFTs 12/21/2023   Skin ulcer of right great toe, limited to breakdown of skin (HCC) 10/07/2023   Diabetic polyneuropathy associated with diabetes mellitus due to underlying condition (HCC) 10/05/2023   Other insomnia 10/05/2023   Bilateral carpal tunnel syndrome 10/05/2023   Cubital tunnel syndrome, bilateral 10/05/2023   Chronic pain syndrome 10/05/2023   Encounter for pain management 10/05/2023   Pulmonary HTN (HCC)    Secondary hyperparathyroidism of renal origin 02/24/2023   End stage renal disease (HCC) 02/19/2023   Acquired absence of left leg below knee (HCC) 02/14/2023   Acquired absence of other right toe(s) 02/14/2023   Dependence on renal dialysis 02/14/2023   Hyperlipidemia, unspecified 02/14/2023   Hypertensive heart  and chronic kidney disease with heart failure and with stage 5 chronic kidney disease, or end stage renal disease (HCC) 02/14/2023   Nicotine dependence, cigarettes, uncomplicated 02/14/2023   Type 2 diabetes mellitus with diabetic neuropathy, unspecified (HCC) 02/14/2023   Type 2 diabetes mellitus with diabetic peripheral angiopathy without gangrene (HCC) 02/14/2023   Type 2 diabetes mellitus with unspecified diabetic retinopathy without macular edema (HCC) 02/14/2023   Diarrhea of presumed infectious origin 02/08/2023   Metabolic acidosis 02/08/2023   Anemia of chronic disease 02/08/2023   Acute renal failure superimposed on chronic kidney disease 02/08/2023   Encephalopathy acute 02/07/2023   Shock circulatory (HCC) 02/05/2023   PAD (peripheral artery disease) 10/27/2022   Renal insufficiency    Acute on chronic systolic CHF (congestive heart failure) (HCC) 01/23/2022   Prolonged QT interval 01/23/2022   Chest pain 01/23/2022   Elevated troponin 01/23/2022   Anemia due to chronic kidney disease 01/23/2022   Hypertensive emergency 01/23/2022   Hyponatremia 01/23/2022   Chronic combined systolic (congestive) and diastolic (congestive) heart failure (HCC) 04/07/2021   Chronic renal disease, stage 4, severely decreased glomerular filtration rate between 15-29 mL/min/1.73 square meter (HCC)    Hypertensive urgency    Acute kidney injury superimposed on chronic kidney disease    Acute CHF (congestive heart failure) (HCC) 02/27/2021   Normocytic anemia 02/27/2021   Anasarca 02/27/2021   Abscess of left foot 12/10/2020   S/P BKA (below knee amputation) unilateral, left (HCC) 12/10/2020   Dyslipidemia 12/10/2020   CKD (chronic kidney disease), stage III (HCC) 12/10/2020   HTN (hypertension) 11/12/2020   Cellulitis and abscess of foot    Diabetes mellitus with hyperglycemia (HCC) 09/07/2014   Tobacco abuse 09/07/2014   Abscess of back    Abscess of lower back 09/06/2014   DM2 (diabetes  mellitus, type 2) (HCC) 09/06/2014   Sepsis (HCC) 09/06/2014   Scrotal abscess 06/20/2014   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/2020   pt states recent MD visit /  left eye worse than right   Dyslipidemia 12/10/2020   Hx of BKA, left (HCC)    Hypertension    Morbid obesity (HCC)    Osteomyelitis (HCC)    Pneumonia    Pulmonary HTN (HCC)    mild to moderate (PASP39mmHg) by echo 07/2023    Family History  Problem Relation Age of Onset   Diabetes Mother    Diabetes Father    Diabetes Brother    Colon cancer Neg Hx    Esophageal cancer Neg Hx    Pancreatic cancer Neg Hx    Stomach cancer Neg Hx    Liver disease Neg Hx    CAD Neg Hx     Past Surgical History:  Procedure Laterality Date   ABSCESS DRAINAGE     neck   AMPUTATION Left 11/14/2020   Procedure: LEFT 5TH RAY AMPUTATION;  Surgeon: Harden Jerona GAILS, MD;  Location: [facility] OR;  Service: Orthopedics;  Laterality: Left;   AMPUTATION Left 12/10/2020   Procedure: LEFT BELOW KNEE AMPUTATION;  Surgeon: Harden Jerona GAILS, MD;  Location:  [facility] OR;  Service: Orthopedics;  Laterality: Left;   AMPUTATION TOE Right 03/25/2020   Procedure: AMPUTATION TOE;  Surgeon: Gretel Ozell PARAS, DPM;  Location: [facility] ORS;  Service: Podiatry;  Laterality: Right;   AV FISTULA PLACEMENT Right 05/23/2023   Procedure: RIGHT ARM Brachiocephalic ARTERIOVENOUS (AV) FISTULA CREATION;  Surgeon: Lanis Fonda BRAVO, MD;  Location[facility] OR;  Service: Vascular;  Laterality: Right;   INCISION AND  DRAINAGE ABSCESS Right 09/07/2014   Procedure: INCISION AND DRAINAGE ABSCESS RIGHT FLANK;  Surgeon: Krystal Russell, MD;  Location: [facility] ORS;  Service: General;  Laterality: Right;   IR FLUORO GUIDE CV LINE RIGHT  02/11/2023   IR PARACENTESIS  10/03/2023   IR PARACENTESIS  10/19/2023   IR PARACENTESIS  11/07/2023   IR PARACENTESIS  11/30/2023   IR PARACENTESIS  12/23/2023   IR PARACENTESIS  02/03/2024   IR REMOVAL TUN CV CATH W/O FL  11/30/2023   IR US  GUIDE VASC ACCESS RIGHT  02/11/2023   LEG AMPUTATION BELOW KNEE Left 12/10/2020   SCROTUM EXPLORATION     Social History   Occupational History   Not on file  Tobacco Use   Smoking status: Every Day    Current packs/day: 0.00    Average packs/day: 0.5 packs/day for 32.0 years (16.0 ttl pk-yrs)    Types: Cigarettes    Start date: 08/24/1989    Last attempt to quit: 08/24/2021    Years since quitting: 2.6    Passive exposure: Past   Smokeless tobacco: Former   Tobacco comments:    Quit smoking previously February 2023  Vaping Use   Vaping status: Never Used  Substance and Sexual Activity   Alcohol  use: No   Drug use: No   Sexual activity: Not on file

## 2024-04-13 ENCOUNTER — Other Ambulatory Visit: Payer: Self-pay

## 2024-04-13 ENCOUNTER — Encounter (HOSPITAL_COMMUNITY): Payer: Self-pay

## 2024-04-13 ENCOUNTER — Encounter (HOSPITAL_BASED_OUTPATIENT_CLINIC_OR_DEPARTMENT_OTHER): Attending: General Surgery | Admitting: General Surgery

## 2024-04-13 ENCOUNTER — Ambulatory Visit (HOSPITAL_COMMUNITY): Admission: RE | Admit: 2024-04-13 | Source: Ambulatory Visit

## 2024-04-13 DIAGNOSIS — I5042 Chronic combined systolic (congestive) and diastolic (congestive) heart failure: Secondary | ICD-10-CM | POA: Diagnosis not present

## 2024-04-13 DIAGNOSIS — L97513 Non-pressure chronic ulcer of other part of right foot with necrosis of muscle: Secondary | ICD-10-CM | POA: Insufficient documentation

## 2024-04-13 DIAGNOSIS — E11621 Type 2 diabetes mellitus with foot ulcer: Secondary | ICD-10-CM | POA: Diagnosis present

## 2024-04-13 DIAGNOSIS — L97822 Non-pressure chronic ulcer of other part of left lower leg with fat layer exposed: Secondary | ICD-10-CM | POA: Diagnosis not present

## 2024-04-13 DIAGNOSIS — E1122 Type 2 diabetes mellitus with diabetic chronic kidney disease: Secondary | ICD-10-CM | POA: Insufficient documentation

## 2024-04-13 DIAGNOSIS — E11622 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 ulcer: Secondary | ICD-10-CM | POA: Insufficient documentation

## 2024-04-13 DIAGNOSIS — N186 End stage renal disease: Secondary | ICD-10-CM | POA: Insufficient documentation

## 2024-04-13 DIAGNOSIS — I132 Hypertensive heart and chronic kidney disease with heart failure and with stage 5 chronic kidney disease, or end stage renal disease: Secondary | ICD-10-CM | POA: Diagnosis not present

## 2024-04-13 NOTE — Patient Instructions (Signed)
 Visit Information  Thank you for taking time to visit with me today. Please don't hesitate to contact me if I can be of assistance to you before our next scheduled appointment.  Your next care management appointment is no further scheduled appointments.   Patient declined additional follow up appt with BSW due to no other needs at this time.  Please call the care guide team at [phone] if you need to cancel, schedule, or reschedule an appointment.   Please call the Suicide and Crisis Lifeline: 988 go to [facility] [address] [place] [phone]) call 911 if you are experiencing a Mental Health or Behavioral Health Crisis or need someone to talk to.  Laymon Doll, BSW [place]/VBCI - [employer] Social Worker [phone]

## 2024-04-13 NOTE — Patient Outreach (Signed)
 Complex Care Management   Visit Note  04/13/2024  Name:  QUANTE PETTRY MRN: [id number] DOB: Dec 08, 1970  Situation: Referral received for Complex Care Management related to SDOH Barriers:  Food insecurity Finding dental care I obtained verbal consent from Patient.  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 Pt was alert and cognitive. Pt reports he still has not received any resources in the mail and is not sure why he is having this issue since he does not have any issues with other mail. BSW provided resources via email instead and pt confirmed he received them while on the phone. BSW confirmed if pt had any other needs and pt stated no. Pt declined f/u appt and stated he would have his niece help him review the resources provided. BSW will notify Olam Ally that he will be stepping away from pt care team and can loop BSW in if needs change. Pt understood and agreed.  SABRA SDOH Interventions    Flowsheet Row Patient Outreach Telephone from 04/13/2024 in [place] POPULATION HEALTH DEPARTMENT Patient Outreach Telephone from 03/02/2024 in [place]  HEALTH POPULATION HEALTH DEPARTMENT Patient Outreach Telephone from 02/24/2024 in [place] HEALTH POPULATION HEALTH DEPARTMENT Office Visit from 08/31/2023 in [facility] [place] [facility] [place] Telephone from 03/09/2023 in [place] POPULATION HEALTH DEPARTMENT Telephone from 03/02/2023 in [place] POPULATION HEALTH DEPARTMENT  SDOH Interventions        Food Insecurity Interventions Community Resources Provided  [resources provided via email.] Community Resources Provided  [food resources will be mailed out to pt.] [employer] Provided  [employer] will make referral to BSW] Intervention Not Indicated -- --  Housing Interventions Community Resources Provided [employer] Provided  [BSW provided patient different housing authorities contact information] Other (Comment)  [Patient is requesting assistance with resources to move] Intervention Not Indicated -- --  Transportation Interventions -- -- Intervention Not Indicated Intervention Not Indicated -- Intervention Not Indicated  Utilities Interventions -- Intervention Not Indicated Intervention Not Indicated Intervention Not Indicated Intervention Not Indicated --  Alcohol  Usage Interventions -- -- -- Intervention Not Indicated (Score <7) -- --  Financial Strain Interventions -- -- -- Intervention Not Indicated -- --  Physical Activity Interventions -- -- -- [employer] -- --  Stress Interventions -- -- -- Intervention Not Indicated -- --  Social Connections Interventions -- -- -- Intervention Not Indicated -- --  Health Literacy Interventions -- -- -- -- Other (Comment)  [  Son assists] --      Recommendation:   Review resources  Follow Up Plan:   Patient has met all care management goals. Care Management case will be closed. Patient has been provided contact information should new needs arise.   Laymon Doll, BSW [place]/VBCI - [employer] Social Worker [phone]

## 2024-04-16 ENCOUNTER — Ambulatory Visit (HOSPITAL_COMMUNITY)
Admission: RE | Admit: 2024-04-16 | Discharge: 2024-04-16 | Disposition: A | Discharge status: Home / Self Care | Source: Ambulatory Visit | Attending: Nephrology | Admitting: Nephrology

## 2024-04-16 ENCOUNTER — Telehealth: Payer: Self-pay | Admitting: Family Medicine

## 2024-04-16 DIAGNOSIS — N186 End stage renal disease: Secondary | ICD-10-CM | POA: Insufficient documentation

## 2024-04-16 DIAGNOSIS — R188 Other ascites: Secondary | ICD-10-CM | POA: Insufficient documentation

## 2024-04-16 HISTORY — PX: IR PARACENTESIS: IMG2679

## 2024-04-16 MED ORDER — LIDOCAINE HCL 1 % IJ SOLN
INTRAMUSCULAR | Status: AC
  Filled 2024-04-16: qty 20

## 2024-04-16 MED ORDER — LIDOCAINE HCL 1 % IJ SOLN: 20.0000 mL | Freq: Once | INTRAMUSCULAR | Status: DC

## 2024-04-16 NOTE — Telephone Encounter (Signed)
 Drop off [id number] FORM

## 2024-04-16 NOTE — Procedures (Signed)
 PROCEDURE SUMMARY:  Successful image-guided paracentesis from the left abdomen.  Yielded 4.5 liters of clear, straw-colored fluid.  No immediate complications.  EBL: zero Patient tolerated well.   Please see imaging section of Epic for full dictation.  [place] LABOR Tariyah Pendry PA-C 04/16/2024 3:11 PM

## 2024-04-17 ENCOUNTER — Telehealth: Payer: Self-pay

## 2024-04-17 NOTE — Progress Notes (Signed)
 Complex Care Management Care Guide Note  04/17/2024 Name: Todd Mccoy MRN: [id number] DOB: 11/13/1970  Todd Mccoy is a 53 y.o. year old male who is a primary care patient of [name], [name], MD and is actively engaged with the care management team. I reached out to [name] by phone today to assist with re-scheduling  with the Licensed Clinical Child psychotherapist.  Follow up plan: Unsuccessful telephone outreach attempt made. A HIPAA compliant phone message was left for the patient providing contact information and requesting a return call.  Jeoffrey Buffalo , RMA     [facility]  [facility], [facility] Guide  Direct Dial : [phone]  Website: [email or web address]

## 2024-04-22 ENCOUNTER — Other Ambulatory Visit: Payer: Self-pay | Admitting: Family Medicine

## 2024-04-22 DIAGNOSIS — Z89512 Acquired absence of left leg below knee: Secondary | ICD-10-CM

## 2024-04-24 ENCOUNTER — Telehealth

## 2024-04-26 ENCOUNTER — Telehealth: Payer: Self-pay

## 2024-04-26 NOTE — Telephone Encounter (Signed)
 PCs form dropped off at the office call to patient x2  to verified information VM Left

## 2024-04-27 ENCOUNTER — Encounter (HOSPITAL_BASED_OUTPATIENT_CLINIC_OR_DEPARTMENT_OTHER): Admitting: General Surgery

## 2024-04-27 DIAGNOSIS — E11621 Type 2 diabetes mellitus with foot ulcer: Secondary | ICD-10-CM | POA: Diagnosis not present

## 2024-05-07 ENCOUNTER — Encounter: Payer: Self-pay | Admitting: Radiology

## 2024-05-11 ENCOUNTER — Other Ambulatory Visit (HOSPITAL_COMMUNITY): Payer: Self-pay | Admitting: Nephrology

## 2024-05-11 ENCOUNTER — Encounter (HOSPITAL_BASED_OUTPATIENT_CLINIC_OR_DEPARTMENT_OTHER): Attending: General Surgery | Admitting: General Surgery

## 2024-05-11 DIAGNOSIS — N186 End stage renal disease: Secondary | ICD-10-CM | POA: Insufficient documentation

## 2024-05-11 DIAGNOSIS — E11621 Type 2 diabetes mellitus with foot ulcer: Secondary | ICD-10-CM | POA: Insufficient documentation

## 2024-05-11 DIAGNOSIS — L97822 Non-pressure chronic ulcer of other part of left lower leg with fat layer exposed: Secondary | ICD-10-CM | POA: Insufficient documentation

## 2024-05-11 DIAGNOSIS — R188 Other ascites: Secondary | ICD-10-CM

## 2024-05-11 DIAGNOSIS — E1122 Type 2 diabetes mellitus with diabetic chronic kidney disease: Secondary | ICD-10-CM | POA: Insufficient documentation

## 2024-05-11 DIAGNOSIS — E11622 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 ulcer: Secondary | ICD-10-CM | POA: Insufficient documentation

## 2024-05-11 DIAGNOSIS — L97513 Non-pressure chronic ulcer of other part of right foot with necrosis of muscle: Secondary | ICD-10-CM | POA: Insufficient documentation

## 2024-05-11 DIAGNOSIS — I5042 Chronic combined systolic (congestive) and diastolic (congestive) heart failure: Secondary | ICD-10-CM | POA: Insufficient documentation

## 2024-05-14 ENCOUNTER — Ambulatory Visit (HOSPITAL_COMMUNITY)
Admission: RE | Admit: 2024-05-14 | Discharge: 2024-05-14 | Disposition: A | Discharge status: Home / Self Care | Source: Ambulatory Visit | Attending: Nephrology | Admitting: Nephrology

## 2024-05-14 DIAGNOSIS — R188 Other ascites: Secondary | ICD-10-CM | POA: Diagnosis present

## 2024-05-14 HISTORY — PX: IR PARACENTESIS: IMG2679

## 2024-05-14 MED ORDER — LIDOCAINE-EPINEPHRINE 1 %-1:100000 IJ SOLN
20.0000 mL | Freq: Once | INTRAMUSCULAR | Status: AC
  Administered 2024-05-14: 10 mL via INTRADERMAL

## 2024-05-14 MED ORDER — LIDOCAINE-EPINEPHRINE 1 %-1:100000 IJ SOLN
INTRAMUSCULAR | Status: AC
  Filled 2024-05-14: qty 1

## 2024-05-14 NOTE — Procedures (Signed)
 Ultrasound-guided therapeutic paracentesis performed yielding 2.5 liters of straw  colored fluid.. No immediate complications. EBL is none.

## 2024-05-15 NOTE — Progress Notes (Signed)
 Complex Care Management Care Guide Note  05/15/2024 Name: Todd Mccoy MRN: [id number] DOB: April 02, 1971  Todd Mccoy is a 53 y.o. year old male who is a primary care patient of [name], [name], MD and is actively engaged with the care management team. I reached out to [name] by phone today to assist with re-scheduling  with the Licensed Clinical Child Psychotherapist.  Follow up plan: Telephone appointment with complex care management team member scheduled for:  06/05/2024  Jeoffrey Buffalo , RMA     [place]  [facility], [facility] Guide  Direct Dial : [phone]  Website: [email or web address]

## 2024-05-20 ENCOUNTER — Other Ambulatory Visit: Payer: Self-pay | Admitting: Family Medicine

## 2024-05-20 DIAGNOSIS — Z89512 Acquired absence of left leg below knee: Secondary | ICD-10-CM

## 2024-05-25 ENCOUNTER — Encounter (HOSPITAL_BASED_OUTPATIENT_CLINIC_OR_DEPARTMENT_OTHER): Admitting: General Surgery

## 2024-06-05 ENCOUNTER — Other Ambulatory Visit: Payer: Self-pay

## 2024-06-05 NOTE — Patient Instructions (Signed)
 Visit Information  Todd Mccoy - I am sorry I was unable to reach you today for our scheduled appointment. I work with [name], [name], MD and am calling to support your healthcare needs. Please contact me at [phone] at your earliest convenience. I look forward to speaking with you soon.    Your next care management appointment is by telephone on 06/12/2024 at 11;00 am    Please call the care guide team at [phone] if you need to cancel, schedule, or reschedule an appointment.   Please call 911 if you are experiencing a Mental Health or Behavioral Health Crisis or need someone to talk to. Olam Ally, MSW, LCSW [place]  [facility], [facility] Health Licensed Clinical Social Worker Direct Dial : [phone]

## 2024-06-05 NOTE — Patient Outreach (Signed)
 LCSW called patient for scheduled visit and was unable to reach patient. LCSW left voice mail informing patient that another visit has been scheduled for 06/12/2024 at 11:00 am. This is the 1st call attempt.  Olam Ally, MSW, LCSW [place]  [facility], [facility] Licensed Clinical Social Worker Direct Dial : [phone]

## 2024-06-08 ENCOUNTER — Encounter (HOSPITAL_BASED_OUTPATIENT_CLINIC_OR_DEPARTMENT_OTHER): Admitting: General Surgery

## 2024-06-12 ENCOUNTER — Other Ambulatory Visit: Payer: Self-pay

## 2024-06-12 NOTE — Patient Instructions (Signed)
 Visit Information  COSIMO SCHERTZER - I am sorry I was unable to reach you today for our scheduled appointment. I work with [name], [name], MD and am calling to support your healthcare needs. Please contact me at [phone] at your earliest convenience. I look forward to speaking with you soon.   Your next care management appointment is no further scheduled appointments.     Olam Ally, MSW, LCSW [place]  [facility], [facility] Licensed Clinical Social Worker Direct Dial : [phone]

## 2024-06-12 NOTE — Patient Outreach (Signed)
 LCSW attempted to reach patient and was unable to do so. LCSW completed a chart review and patient has had 3 attempts to be contacted on 10/7, 12/02 and 12/09. LCSW left a voicemail informing patient if he wants to schedule an appointment to reach out to LCSW. If LCSW does not hear back from patient, case will be closed out from [place] services.  Olam Ally, MSW, LCSW [place]  [facility], [facility] Health Licensed Clinical Social Worker Direct Dial : [phone]

## 2024-06-18 ENCOUNTER — Ambulatory Visit (HOSPITAL_COMMUNITY)
Admission: RE | Admit: 2024-06-18 | Discharge: 2024-06-18 | Disposition: A | Source: Ambulatory Visit | Attending: Internal Medicine | Admitting: Internal Medicine

## 2024-06-18 ENCOUNTER — Other Ambulatory Visit (HOSPITAL_COMMUNITY): Payer: Self-pay | Admitting: Internal Medicine

## 2024-06-18 ENCOUNTER — Encounter (HOSPITAL_BASED_OUTPATIENT_CLINIC_OR_DEPARTMENT_OTHER): Attending: General Surgery | Admitting: Internal Medicine

## 2024-06-18 DIAGNOSIS — L97822 Non-pressure chronic ulcer of other part of left lower leg with fat layer exposed: Secondary | ICD-10-CM | POA: Insufficient documentation

## 2024-06-18 DIAGNOSIS — L97513 Non-pressure chronic ulcer of other part of right foot with necrosis of muscle: Secondary | ICD-10-CM | POA: Diagnosis present

## 2024-06-18 DIAGNOSIS — M869 Osteomyelitis, unspecified: Secondary | ICD-10-CM | POA: Insufficient documentation

## 2024-06-18 DIAGNOSIS — L97509 Non-pressure chronic ulcer of other part of unspecified foot with unspecified severity: Secondary | ICD-10-CM | POA: Diagnosis present

## 2024-06-18 DIAGNOSIS — E1169 Type 2 diabetes mellitus with other specified complication: Secondary | ICD-10-CM | POA: Insufficient documentation

## 2024-06-18 DIAGNOSIS — E11622 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 ulcer: Secondary | ICD-10-CM | POA: Diagnosis not present

## 2024-06-18 DIAGNOSIS — I5042 Chronic combined systolic (congestive) and diastolic (congestive) heart failure: Secondary | ICD-10-CM | POA: Insufficient documentation

## 2024-06-18 DIAGNOSIS — E11621 Type 2 diabetes mellitus with foot ulcer: Secondary | ICD-10-CM

## 2024-06-18 DIAGNOSIS — N186 End stage renal disease: Secondary | ICD-10-CM | POA: Diagnosis not present

## 2024-06-19 ENCOUNTER — Other Ambulatory Visit: Payer: Self-pay | Admitting: Family Medicine

## 2024-06-19 ENCOUNTER — Telehealth: Payer: Self-pay | Admitting: Family Medicine

## 2024-06-19 ENCOUNTER — Other Ambulatory Visit (HOSPITAL_COMMUNITY): Payer: Self-pay

## 2024-06-19 MED ORDER — LANTUS SOLOSTAR 100 UNIT/ML ~~LOC~~ SOPN: 5.0000 [IU] | PEN_INJECTOR | Freq: Every day | SUBCUTANEOUS | 3 refills | Status: DC

## 2024-06-19 MED ORDER — LANTUS SOLOSTAR 100 UNIT/ML ~~LOC~~ SOPN
5.0000 [IU] | PEN_INJECTOR | Freq: Every day | SUBCUTANEOUS | 3 refills | Status: DC
  Filled 2024-06-19: qty 3, 28d supply, fill #0

## 2024-06-19 NOTE — Addendum Note (Signed)
 Addended by: Guynell Kleiber on: 06/19/2024 04:53 PM   Modules accepted: Orders

## 2024-06-19 NOTE — Telephone Encounter (Signed)
 Copied from CRM #[id number]. Topic: Clinical - Prescription Issue >> Jun 19, 2024 11:38 AM [place] MATSU wrote:  Reason for CRM: Patient called in to advise that he is vision impaired and  the medication  insulin  glargine (LANTUS ) 100 UNIT/ML injection [[id number]] was given to him in a vial. Per patient he needs a pen or something that it is easier per patient   Patient is requesting a change vial of insulin  to pen because if he doesn't have someone to help him he can not take insulin .   May you please assist.

## 2024-06-19 NOTE — Telephone Encounter (Signed)
 Done

## 2024-06-19 NOTE — Telephone Encounter (Signed)
 Can prescription be changed to Lantus  pen?

## 2024-06-20 NOTE — Telephone Encounter (Signed)
Patient has been informed of medication change. 

## 2024-06-23 ENCOUNTER — Other Ambulatory Visit: Payer: Self-pay | Admitting: Family Medicine

## 2024-06-23 DIAGNOSIS — I5023 Acute on chronic systolic (congestive) heart failure: Secondary | ICD-10-CM

## 2024-06-26 ENCOUNTER — Ambulatory Visit (INDEPENDENT_AMBULATORY_CARE_PROVIDER_SITE_OTHER)

## 2024-06-26 ENCOUNTER — Telehealth: Payer: Self-pay

## 2024-06-26 ENCOUNTER — Ambulatory Visit: Attending: Family Medicine

## 2024-06-26 VITALS — Wt 189.6 lb

## 2024-06-26 DIAGNOSIS — Z0000 Encounter for general adult medical examination without abnormal findings: Secondary | ICD-10-CM | POA: Diagnosis not present

## 2024-06-26 DIAGNOSIS — I739 Peripheral vascular disease, unspecified: Secondary | ICD-10-CM

## 2024-06-26 DIAGNOSIS — M86171 Other acute osteomyelitis, right ankle and foot: Secondary | ICD-10-CM | POA: Diagnosis not present

## 2024-06-26 MED ORDER — DOXYCYCLINE HYCLATE 100 MG PO TABS: 100.0000 mg | ORAL_TABLET | Freq: Two times a day (BID) | ORAL | 0 refills | Status: DC

## 2024-06-26 NOTE — Progress Notes (Signed)
 "  Chief Complaint  Patient presents with   Medicare Wellness    INITIAL     Subjective:   Todd Mccoy is a 53 y.o. male who presents for a Medicare Annual Wellness Visit.  Visit info / Clinical Intake: Medicare Wellness Visit Type:: Initial Annual Wellness Visit Persons participating in visit and providing information:: patient Medicare Wellness Visit Mode:: Telephone If telephone:: video declined Since this visit was completed virtually, some vitals may be partially provided or unavailable. Missing vitals are due to the limitations of the virtual format.: Documented vitals are patient reported If Telephone or Video please confirm:: I connected with patient using audio/video enable telemedicine. I verified patient identity with two identifiers, discussed telehealth limitations, and patient agreed to proceed. Patient Location:: HOME Provider Location:: OFFICE Interpreter Needed?: No Pre-visit prep was completed: yes AWV questionnaire completed by patient prior to visit?: no Living arrangements:: with family/others Patient's Overall Health Status Rating: (!) fair Typical amount of pain: (!) a lot Does pain affect daily life?: no Are you currently prescribed opioids?: no  Dietary Habits and Nutritional Risks How many meals a day?: 2 Eats fruit and vegetables daily?: (!) no (diabetic) Most meals are obtained by: having others provide food In the last 2 weeks, have you had any of the following?: (!) nausea, vomiting, diarrhea Diabetic:: (!) yes Any non-healing wounds?: no How often do you check your BS?: 1 Would you like to be referred to a Nutritionist or for Diabetic Management? : no  Functional Status Activities of Daily Living (to include ambulation/medication): (!) Needs Assist (PATIENT HAS AN AID) Feeding: Independent Dressing/Grooming: Needs assistance Bathing: Needs assistance Toileting: Needs assistance Transfer: Needs assistance Ambulation: Independent with  device- listed below Home Assistive Devices/Equipment: Wheelchair; Shower/tub chair Medication Administration: Needs assistance (comment) Home Management (perform basic housework or laundry): Needs assistance (comment) Manage your own finances?: yes Primary transportation is: family / friends Concerns about vision?: no *vision screening is required for WTM* Concerns about hearing?: no  Fall Screening Falls in the past year?: 1 Number of falls in past year: 0 Was there an injury with Fall?: 1 (left shoulder) Fall Risk Category Calculator: 2 Patient Fall Risk Level: Moderate Fall Risk  Fall Risk Patient at Risk for Falls Due to: Impaired mobility; Impaired balance/gait Fall risk Follow up: Falls evaluation completed  Home and Transportation Safety: All rugs have non-skid backing?: N/A, no rugs All stairs or steps have railings?: yes (on the main level) Grab bars in the bathtub or shower?: (!) no Have non-skid surface in bathtub or shower?: yes Good home lighting?: yes Regular seat belt use?: yes Hospital stays in the last year:: no  Cognitive Assessment Difficulty concentrating, remembering, or making decisions? : no Will 6CIT or Mini Cog be Completed: yes What year is it?: 0 points What month is it?: 0 points Give patient an address phrase to remember (5 components): [place] [address] About what time is it?: 0 points Count backwards from 20 to 1: 0 points Say the months of the year in reverse: 0 points Repeat the address phrase from earlier: 0 points 6 CIT Score: 0 points  Advance Directives (For Healthcare) Does Patient Have a Medical Advance Directive?: No Type of Advance Directive: Healthcare Power of Attorney Copy of Healthcare Power of Attorney in Chart?: No - copy requested Would patient like information on creating a medical advance directive?: No - Patient declined  Reviewed/Updated  Reviewed/Updated: Reviewed All (Medical, Surgical, Family,  Medications, Allergies, Care Teams,  Patient Goals)    Allergies (verified) Patient has no known allergies.   Current Medications (verified) Outpatient Encounter Medications as of 06/26/2024  Medication Sig   amLODipine  (NORVASC ) 10 MG tablet Take 1 tablet (10 mg total) by mouth daily.   atorvastatin  (LIPITOR) 20 MG tablet TAKE 1 TABLET (20 MG TOTAL) BY MOUTH DAILY.   Baclofen  5 MG TABS Take 1 tablet (5 mg total) by mouth 2 (two) times daily as needed.   calcitRIOL  (ROCALTROL ) 0.25 MCG capsule Take 1 capsule (0.25 mcg total) by mouth 2 (two) times daily. (Patient not taking: Reported on 12/29/2023)   carvedilol  (COREG ) 25 MG tablet Take 1 tablet (25 mg total) by mouth 2 (two) times daily with a meal.   docusate sodium  (COLACE) 100 MG capsule Take 1 capsule (100 mg total) by mouth 2 (two) times daily as needed for mild constipation. (Patient not taking: Reported on 12/29/2023)   doxycycline  (VIBRAMYCIN ) 100 MG capsule Take 1 capsule (100 mg total) by mouth 2 (two) times daily.   ferrous sulfate  325 (65 FE) MG EC tablet Take 1 tablet (325 mg total) by mouth daily with breakfast.   furosemide  (LASIX ) 40 MG tablet Take 1 tablet by mouth twice daily   gabapentin  (NEURONTIN ) 300 MG capsule Take 2 capsules by mouth twice daily   gentamicin ointment (GARAMYCIN) 0.1 % SMARTSIG:sparingly Topical   hydrALAZINE  (APRESOLINE ) 50 MG tablet Take 50 mg by mouth 3 (three) times daily.   hydrOXYzine  (ATARAX ) 50 MG tablet Take 1 tablet (50 mg total) by mouth 3 (three) times daily as needed for anxiety or itching. (Patient not taking: Reported on 02/24/2024)   insulin  glargine (LANTUS  SOLOSTAR) 100 UNIT/ML Solostar Pen Inject 5 Units into the skin at bedtime.   isosorbide  mononitrate (IMDUR ) 60 MG 24 hr tablet Take 1 tablet (60 mg total) by mouth daily.   levofloxacin (LEVAQUIN) 250 MG tablet Take by mouth.   lidocaine  (LIDODERM ) 5 % Place 1 patch onto the skin daily. Remove & Discard patch within 12 hours or as  directed by MD (Patient not taking: Reported on 02/24/2024)   magnesium  oxide (MAG-OX) 400 (240 Mg) MG tablet Take 1 tablet (400 mg total) by mouth 2 (two) times daily.   minocycline (MINOCIN) 100 MG capsule Take 100 mg by mouth 2 (two) times daily. (Patient not taking: Reported on 02/24/2024)   multivitamin (RENA-VIT) TABS tablet Take 1 tablet by mouth at bedtime.   nortriptyline  (PAMELOR ) 25 MG capsule Take 2 capsules (50 mg total) by mouth at bedtime. Start with 1 capsule at nighttime for 1 week, then if no side effects increase to 2 capsules at nighttime   polyethylene glycol (MIRALAX  / GLYCOLAX ) 17 g packet Take 17 g by mouth daily as needed for moderate constipation.   torsemide  (DEMADEX ) 20 MG tablet Take 4 tablets (80 mg total) by mouth daily.   [DISCONTINUED] furosemide  (LASIX ) 40 MG tablet Take 1 tablet (40 mg total) by mouth 2 (two) times daily.   No facility-administered encounter medications on file as of 06/26/2024.    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/2020   pt states recent MD visit /left eye worse than right   Dyslipidemia 12/10/2020   Hx of BKA, left (HCC)    Hypertension    Morbid obesity (HCC)    Osteomyelitis (HCC)    Pneumonia    Pulmonary HTN (HCC)    mild to moderate (PASP60mmHg) by echo 07/2023   Past Surgical History:  Procedure Laterality Date   ABSCESS DRAINAGE     neck   AMPUTATION Left 11/14/2020   Procedure: LEFT 5TH RAY AMPUTATION;  Surgeon: Harden Jerona GAILS, MD;  Location: [facility] OR;  Service: Orthopedics;  Laterality: Left;   AMPUTATION Left 12/10/2020   Procedure: LEFT BELOW KNEE AMPUTATION;  Surgeon: Harden Jerona GAILS, MD;  Location: [facility] OR;  Service: Orthopedics;  Laterality: Left;   AMPUTATION TOE Right 03/25/2020   Procedure: AMPUTATION TOE;  Surgeon: Gretel Ozell PARAS, DPM;  Location: [facility] ORS;  Service: Podiatry;  Laterality: Right;   AV FISTULA PLACEMENT Right 05/23/2023   Procedure: RIGHT ARM Brachiocephalic ARTERIOVENOUS (AV) FISTULA CREATION;  Surgeon: Lanis Fonda BRAVO, MD;  Location: [facility] OR;  Service: Vascular;  Laterality: Right;   INCISION AND DRAINAGE ABSCESS Right 09/07/2014   Procedure: INCISION AND DRAINAGE ABSCESS RIGHT FLANK;  Surgeon: Krystal Russell, MD;  Location: [facility] ORS;  Service: General;  Laterality: Right;   IR FLUORO GUIDE CV LINE RIGHT  02/11/2023   IR PARACENTESIS  10/03/2023   IR PARACENTESIS  10/19/2023   IR PARACENTESIS  11/07/2023   IR PARACENTESIS  11/30/2023   IR PARACENTESIS  12/23/2023   IR PARACENTESIS  02/03/2024   IR PARACENTESIS  04/16/2024   IR PARACENTESIS  05/14/2024   IR REMOVAL TUN CV CATH W/O FL  11/30/2023   IR US  GUIDE VASC ACCESS RIGHT  02/11/2023   LEG AMPUTATION BELOW KNEE Left 12/10/2020   SCROTUM EXPLORATION     Family History  Problem Relation Age of Onset   Diabetes Mother    Diabetes Father    Diabetes Brother    Colon cancer Neg Hx    Esophageal cancer Neg Hx    Pancreatic cancer Neg Hx    Stomach cancer Neg Hx    Liver disease Neg Hx    CAD Neg Hx    Social History   Occupational History   Not on file  Tobacco Use   Smoking status: Every Day    Current packs/day: 0.00    Average packs/day: 0.5 packs/day for 32.0 years (16.0 ttl pk-yrs)    Types: Cigarettes    Start date: 08/24/1989    Last attempt to quit: 08/24/2021    Years since quitting: 2.8    Passive exposure: Past   Smokeless tobacco: Former   Tobacco comments:    Quit smoking previously February 2023  Vaping Use   Vaping status:  Never Used  Substance and Sexual Activity   Alcohol  use: No   Drug use: No   Sexual activity: Not on file   Tobacco Counseling Ready to quit: Not Answered Counseling given: Not Answered Tobacco comments: Quit smoking previously February 2023  SDOH Screenings   Food Insecurity: Food Insecurity Present (06/26/2024)  Housing: Low Risk (06/26/2024)  Transportation Needs: No Transportation Needs (06/26/2024)  Utilities: Not At Risk (06/26/2024)  Alcohol  Screen: Low Risk (06/26/2024)  Depression (PHQ2-9): Low Risk (06/26/2024)  Financial Resource Strain: Low Risk (  06/26/2024)  Physical Activity: Inactive (06/26/2024)  Social Connections: Socially Isolated (06/26/2024)  Stress: No Stress Concern Present (06/26/2024)  Tobacco Use: High Risk (06/26/2024)  Health Literacy: Inadequate Health Literacy (06/26/2024)   See flowsheets for full screening details  Depression Screen PHQ 2 & 9 Depression Scale- Over the past 2 weeks, how often have you been bothered by any of the following problems? Little interest or pleasure in doing things: 0 Feeling down, depressed, or hopeless (PHQ Adolescent also includes...irritable): 0 PHQ-2 Total Score: 0 Trouble falling or staying asleep, or sleeping too much: 0 Feeling tired or having little energy: 0 Poor appetite or overeating (PHQ Adolescent also includes...weight loss): 0 Feeling bad about yourself - or that you are a failure or have let yourself or your family down: 0 Trouble concentrating on things, such as reading the newspaper or watching television (PHQ Adolescent also includes...like school work): 0 Moving or speaking so slowly that other people could have noticed. Or the opposite - being so fidgety or restless that you have been moving around a lot more than usual: 0 Thoughts that you would be better off dead, or of hurting yourself in some way: 0 PHQ-9 Total Score: 0 If you checked off any problems, how difficult have these problems made  it for you to do your work, take care of things at home, or get along with other people?: Not difficult at all  Depression Treatment Depression Interventions/Treatment : [id number] Score <4 Follow-up Not Indicated     Goals Addressed               This Visit's Progress     Client understands the importance of follow-up with providers by attending scheduled visits        Patient Stated (pt-stated)               Objective:    Today's Vitals   06/26/24 1201  Weight: 189 lb 9.5 oz (86 kg)  PainSc: 7   PainLoc: Leg   Body mass index is 27.2 kg/m.  Hearing/Vision screen Hearing Screening - Comments:: Adequate hearing. Vision Screening - Comments:: Vision impaired. Immunizations and Health Maintenance Health Maintenance  Topic Date Due   FOOT EXAM  Never done   DTaP/Tdap/Td (1 - Tdap) Never done   Pneumococcal Vaccine: 50+ Years (2 of 2 - PCV) 06/22/2015   OPHTHALMOLOGY EXAM  09/03/2021   Zoster Vaccines- Shingrix (1 of 2) Never done   Lung Cancer Screening  11/19/2023   Influenza Vaccine  02/03/2024   COVID-19 Vaccine (1 - 2025-26 season) Never done   HEMOGLOBIN A1C  06/29/2024   Medicare Annual Wellness (AWV)  06/26/2025   Colonoscopy  05/26/2030   Hepatitis B Vaccines 19-59 Average Risk  Completed   Hepatitis C Screening  Completed   HIV Screening  Completed   HPV VACCINES  Aged Out   Meningococcal B Vaccine  Aged Out        Assessment/Plan:  This is a routine wellness examination for Todd Mccoy.  Patient Care Team: Delbert Clam, MD as PCP - General (Family Medicine) Shlomo Wilbert SAUNDERS, MD as PCP - Cardiology (Cardiology) Leana Setter, RN (Inactive) as Registered Nurse Gordy Channing LABOR, RN as Registered Nurse [facility] Sherren Olam BRAVO, LCSW as Social Worker (Licensed Visual Merchandiser)  I have personally reviewed and noted the following in the patients chart:   Medical and social history Use of alcohol , tobacco or illicit drugs   Current medications and supplements including opioid prescriptions.  Functional ability and status Nutritional status Physical activity Advanced directives List of other physicians Hospitalizations, surgeries, and ER visits in previous 12 months Vitals Screenings to include cognitive, depression, and falls Referrals and appointments  No orders of the defined types were placed in this encounter.  In addition, I have reviewed and discussed with patient certain preventive protocols, quality metrics, and best practice recommendations. A written personalized care plan for preventive services as well as general preventive health recommendations were provided to patient.   Roz LOISE Fuller, LPN   [id number]   Return in 1 year (on 06/26/2025).  After Visit Summary: (MyChart) Due to this being a telephonic visit, the after visit summary with patients personalized plan was offered to patient via MyChart   Nurse Notes: Patient aware of current care gaps.  Patient declined vaccines. Patient stated that he will discuss care gaps at next appointment with PCP. "

## 2024-06-26 NOTE — Telephone Encounter (Signed)
 He will need an office visit to document medical necessity for insurance purposes.

## 2024-06-26 NOTE — Progress Notes (Signed)
 "  Subjective:  Patient ID: Todd Mccoy, male    DOB: 08-Feb-1971,  MRN: [id number]  Chief Complaint  Patient presents with   Foot Ulcer    Patient is here for ulcer or right foot , actively bleeding was rewrapped earlier today. Has xrays in chart    Discussed the use of AI scribe software for clinical note transcription with the patient, who gave verbal consent to proceed.  History of Present Illness Todd Mccoy is a 53 year old male with left below knee amputation who presents for evaluation of a right foot wound with radiographic evidence of osteomyelitis. He was been following with the wound care center.   He has a chronic right foot wound that had been improving until a new ulcer appeared acutely. There are now two main ulcerations on the right foot, including one involving the fifth toe. The wound dressing currently extends to the posterior calf.  He is followed at a wound care center with daily dressing changes and home health assistance.  Home health services are being re-established after a provider change.  He is on a current antibiotic course of at least seven days. He denies diarrhea, nausea, vomiting. He is unable and unwilling to perform strenuous activity due to his condition.  He reports prior vascular studies of the limb were normal.     Review of Systems: Negative except as noted in the HPI. Denies N/V/F/Ch.  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 Well nourished. Oriented to person, place, and time.  Vascular Dorsalis pedis pulses non-palpable bilaterally. Posterior tibial pulses non-palpable bilaterally. Capillary refill normal to all digits.  No cyanosis or clubbing noted. Pedal hair growth normal.  Neurologic Normal speech. Epicritic sensation to light touch grossly intact bilaterally. Negative tinel sign at tarsal tunnel bilaterally.   Dermatologic Skin texture and turgor are within normal limits.  2 open wounds to lateral foot, one at midfoot and one at 5th metatarsal head. Both wounds probe to underlying 5th metatarsal. Malodor present and minor purulent drainage present. No fluctuance. No additional purulence on exsanguination.   Musculoskeletal: Left BKA. No contributing deformity to right foot.    Radiographs: Taken and reviewed on 06/18/24:  IMPRESSION: 1. Osteomyelitis of the 5th metatarsal head and 5th proximal phalanx. 2. Diffuse soft tissue swelling about the foot consistent with cellulitis.      Assessment:   1. PAD (peripheral artery disease)   2. Acute osteomyelitis of metatarsal bone of right foot (HCC)  Plan:  Patient was evaluated and treated and all questions answered.  Assessment and Plan Assessment & Plan Osteomyelitis of right foot Radiographic and clinical findings confirmed osteomyelitis in the right fifth toe and adjacent bone. Chronicity established by prolonged wound care and prior left below knee amputation. High risk for progression due to ulcer-to-bone communication. MRI needed to assess involvement. Infection control and limb  preservation prioritized. - Ordered MRI of right foot to assess extent of osteomyelitis and confirm diagnosis. - Ordered repeat ankle-brachial index to evaluate vascular status prior to potential surgical intervention. - Prescribed additional antibiotics to bridge until MRI results are available. Doxycyline 100mg  bid. We discussed that he is to take this at least 3-4 hours apart from his ferrous sulfate .  - Discussed likely need for partial fifth ray amputation pending MRI. - F/u post-MRI to review imaging and determine next steps. - Discussed that patient should proceed to ED if he has worsening of infection or systemic signs of illness  Chronic ulcer of right foot Chronic right foot ulcer with recurrent breakdown and poor healing. Two primary ulcerations, one with direct bone exposure. High severity due to infection risk and prior amputation. Continued wound management and monitoring necessary. Emphasized smoking cessation and nutritional optimization. - Continued daily dressing changes, with instructions to change dressing immediately if wet. - Applied Betadine solution - Will discuss with home health aide for dressing changes and efforts to increase support hours. - Reinforced importance of smoking cessation and adequate nutrition for wound healing. - Planned follow-up shortly aafter MRI to reassess wound status   RTC after MRI completed  Prentice Ovens, DPM AACFAS Fellowship Trained Podiatric Surgeon [facility]     [1]  Current Outpatient Medications:    amLODipine  (NORVASC ) 10 MG tablet, Take 1 tablet (10 mg total) by mouth daily., Disp: 30 tablet, Rfl: 0   atorvastatin  (LIPITOR) 20 MG tablet, TAKE 1 TABLET (20 MG TOTAL) BY MOUTH DAILY., Disp: 90 tablet, Rfl: 3   Baclofen  5 MG TABS, Take 1 tablet (5 mg total) by mouth 2 (two) times daily as needed., Disp: 20 tablet, Rfl: 0   calcitRIOL  (ROCALTROL ) 0.25 MCG capsule, Take 1 capsule (0.25 mcg total) by mouth 2 (two)  times daily., Disp: 180 capsule, Rfl: 3   carvedilol  (COREG ) 25 MG tablet, Take 1 tablet (25 mg total) by mouth 2 (two) times daily with a meal., Disp: 90 tablet, Rfl: 3   docusate sodium  (COLACE) 100 MG capsule, Take 1 capsule (100 mg total) by mouth 2 (two) times daily as needed for mild constipation., Disp: 10 capsule, Rfl: 0   doxycycline  (VIBRA -TABS) 100 MG tablet, Take 1 tablet (100 mg total) by mouth 2 (two) times daily for 14 days., Disp: 28 tablet, Rfl: 0   ferrous sulfate  325 (65 FE) MG EC tablet, Take 1 tablet (325 mg total) by mouth daily with breakfast., Disp: 30 tablet, Rfl: 3   furosemide  (LASIX ) 40 MG tablet, Take 1 tablet by mouth twice daily, Disp: 180 tablet, Rfl: 0   gabapentin  (NEURONTIN ) 300 MG capsule, Take 2 capsules by mouth twice daily, Disp: 120 capsule, Rfl: 0   gentamicin ointment (GARAMYCIN) 0.1 %, SMARTSIG:sparingly Topical, Disp: , Rfl:    hydrALAZINE  (APRESOLINE ) 50 MG tablet, Take 50 mg by mouth 3 (three) times daily., Disp: , Rfl:    hydrOXYzine  (ATARAX ) 50 MG tablet, Take 1 tablet (50 mg total) by mouth 3 (three) times daily as needed for anxiety or itching., Disp: 30 tablet, Rfl:  2   insulin  glargine (LANTUS  SOLOSTAR) 100 UNIT/ML Solostar Pen, Inject 5 Units into the skin at bedtime., Disp: 15 mL, Rfl: 3   isosorbide  mononitrate (IMDUR ) 60 MG 24 hr tablet, Take 1 tablet (60 mg total) by mouth daily., Disp: 60 tablet, Rfl: 2   levofloxacin (LEVAQUIN) 250 MG tablet, Take by mouth., Disp: , Rfl:    lidocaine  (LIDODERM ) 5 %, Place 1 patch onto the skin daily. Remove & Discard patch within 12 hours or as directed by MD, Disp: 30 patch, Rfl: 1   magnesium  oxide (MAG-OX) 400 (240 Mg) MG tablet, Take 1 tablet (400 mg total) by mouth 2 (two) times daily., Disp: 60 tablet, Rfl: 2   minocycline (MINOCIN) 100 MG capsule, Take 100 mg by mouth 2 (two) times daily., Disp: , Rfl:    multivitamin (RENA-VIT) TABS tablet, Take 1 tablet by mouth at bedtime., Disp: 30 tablet, Rfl: 2    nortriptyline  (PAMELOR ) 25 MG capsule, Take 2 capsules (50 mg total) by mouth at bedtime. Start with 1 capsule at nighttime for 1 week, then if no side effects increase to 2 capsules at nighttime, Disp: 60 capsule, Rfl: 3   polyethylene glycol (MIRALAX  / GLYCOLAX ) 17 g packet, Take 17 g by mouth daily as needed for moderate constipation., Disp: 14 each, Rfl: 0   torsemide  (DEMADEX ) 20 MG tablet, Take 4 tablets (80 mg total) by mouth daily., Disp: 360 tablet, Rfl: 0 [2]  Social History Tobacco Use  Smoking Status Every Day   Current packs/day: 0.00   Average packs/day: 0.5 packs/day for 32.0 years (16.0 ttl pk-yrs)   Types: Cigarettes   Start date: 08/24/1989   Last attempt to quit: 08/24/2021   Years since quitting: 2.8   Passive exposure: Past  Smokeless Tobacco Former  Tobacco Comments   Quit smoking previously February 2023  [3] No Known Allergies  "

## 2024-06-26 NOTE — Telephone Encounter (Signed)
 Patient stated during his AWV that he needed a rx for a new wheelchair.  His current w/c is missing parts and not durable.  Todd Mccoy N. Tomie, LPN [facility] Annual Wellness Team Direct Dial : [phone]

## 2024-06-26 NOTE — Patient Instructions (Signed)
 Todd Mccoy,  Thank you for taking the time for your Medicare Wellness Visit. I appreciate your continued commitment to your health goals. Please review the care plan we discussed, and feel free to reach out if I can assist you further.  Please note that Annual Wellness Visits do not include a physical exam. Some assessments may be limited, especially if the visit was conducted virtually. If needed, we may recommend an in-person follow-up with your provider.  Ongoing Care Seeing your primary care provider every 3 to 6 months helps us  monitor your health and provide consistent, personalized care.   Referrals If a referral was made during today's visit and you haven't received any updates within two weeks, please contact the referred provider directly to check on the status.  Recommended Screenings:  Health Maintenance  Topic Date Due   Medicare Annual Wellness Visit  Never done   Complete foot exam   Never done   DTaP/Tdap/Td vaccine (1 - Tdap) Never done   Pneumococcal Vaccine for age over 41 (2 of 2 - PCV) 06/22/2015   Eye exam for diabetics  09/03/2021   Zoster (Shingles) Vaccine (1 of 2) Never done   Screening for Lung Cancer  11/19/2023   Flu Shot  02/03/2024   COVID-19 Vaccine (1 - 2025-26 season) Never done   Hemoglobin A1C  06/29/2024   Colon Cancer Screening  05/26/2030   Hepatitis B Vaccine  Completed   Hepatitis C Screening  Completed   HIV Screening  Completed   HPV Vaccine  Aged Out   Meningitis B Vaccine  Aged Out       06/26/2024   12:06 PM  Advanced Directives  Does Patient Have a Medical Advance Directive? No  Would patient like information on creating a medical advance directive? No - Patient declined    Vision: Annual vision screenings are recommended for early detection of glaucoma, cataracts, and diabetic retinopathy. These exams can also reveal signs of chronic conditions such as diabetes and high blood pressure.  Dental: Annual dental screenings help  detect early signs of oral cancer, gum disease, and other conditions linked to overall health, including heart disease and diabetes.  Please see the attached documents for additional preventive care recommendations.

## 2024-06-27 NOTE — Telephone Encounter (Signed)
 Can you please reach out to patient to schedule appointment?

## 2024-06-27 NOTE — Telephone Encounter (Signed)
 Contacted patient, Appointment scheduled. Patient also added on the wait list for any cancellations.

## 2024-06-28 ENCOUNTER — Inpatient Hospital Stay (HOSPITAL_COMMUNITY): Admission: EM | Admit: 2024-06-28 | Discharge: 2024-07-07 | DRG: 239 | Disposition: A | Discharge status: Home Health

## 2024-06-28 ENCOUNTER — Other Ambulatory Visit: Payer: Self-pay

## 2024-06-28 ENCOUNTER — Emergency Department (HOSPITAL_COMMUNITY)

## 2024-06-28 ENCOUNTER — Encounter (HOSPITAL_COMMUNITY): Payer: Self-pay

## 2024-06-28 DIAGNOSIS — E876 Hypokalemia: Secondary | ICD-10-CM | POA: Diagnosis present

## 2024-06-28 DIAGNOSIS — M7989 Other specified soft tissue disorders: Secondary | ICD-10-CM

## 2024-06-28 DIAGNOSIS — L02611 Cutaneous abscess of right foot: Secondary | ICD-10-CM | POA: Diagnosis present

## 2024-06-28 DIAGNOSIS — Z1152 Encounter for screening for COVID-19: Secondary | ICD-10-CM

## 2024-06-28 DIAGNOSIS — B952 Enterococcus as the cause of diseases classified elsewhere: Secondary | ICD-10-CM | POA: Diagnosis present

## 2024-06-28 DIAGNOSIS — Z833 Family history of diabetes mellitus: Secondary | ICD-10-CM

## 2024-06-28 DIAGNOSIS — I9581 Postprocedural hypotension: Secondary | ICD-10-CM | POA: Diagnosis not present

## 2024-06-28 DIAGNOSIS — Z6827 Body mass index (BMI) 27.0-27.9, adult: Secondary | ICD-10-CM

## 2024-06-28 DIAGNOSIS — I5042 Chronic combined systolic (congestive) and diastolic (congestive) heart failure: Secondary | ICD-10-CM | POA: Diagnosis present

## 2024-06-28 DIAGNOSIS — G9341 Metabolic encephalopathy: Secondary | ICD-10-CM | POA: Diagnosis present

## 2024-06-28 DIAGNOSIS — D696 Thrombocytopenia, unspecified: Secondary | ICD-10-CM | POA: Diagnosis present

## 2024-06-28 DIAGNOSIS — L02619 Cutaneous abscess of unspecified foot: Secondary | ICD-10-CM | POA: Diagnosis not present

## 2024-06-28 DIAGNOSIS — E1152 Type 2 diabetes mellitus with diabetic peripheral angiopathy with gangrene: Secondary | ICD-10-CM | POA: Diagnosis present

## 2024-06-28 DIAGNOSIS — E871 Hypo-osmolality and hyponatremia: Secondary | ICD-10-CM | POA: Diagnosis not present

## 2024-06-28 DIAGNOSIS — M86171 Other acute osteomyelitis, right ankle and foot: Secondary | ICD-10-CM | POA: Diagnosis present

## 2024-06-28 DIAGNOSIS — E11621 Type 2 diabetes mellitus with foot ulcer: Secondary | ICD-10-CM | POA: Diagnosis present

## 2024-06-28 DIAGNOSIS — I110 Hypertensive heart disease with heart failure: Secondary | ICD-10-CM | POA: Diagnosis not present

## 2024-06-28 DIAGNOSIS — I5022 Chronic systolic (congestive) heart failure: Secondary | ICD-10-CM

## 2024-06-28 DIAGNOSIS — N3090 Cystitis, unspecified without hematuria: Secondary | ICD-10-CM | POA: Diagnosis present

## 2024-06-28 DIAGNOSIS — E1151 Type 2 diabetes mellitus with diabetic peripheral angiopathy without gangrene: Secondary | ICD-10-CM | POA: Diagnosis present

## 2024-06-28 DIAGNOSIS — M86071 Acute hematogenous osteomyelitis, right ankle and foot: Secondary | ICD-10-CM | POA: Diagnosis not present

## 2024-06-28 DIAGNOSIS — E11319 Type 2 diabetes mellitus with unspecified diabetic retinopathy without macular edema: Secondary | ICD-10-CM | POA: Diagnosis present

## 2024-06-28 DIAGNOSIS — E1169 Type 2 diabetes mellitus with other specified complication: Secondary | ICD-10-CM | POA: Diagnosis present

## 2024-06-28 DIAGNOSIS — N2581 Secondary hyperparathyroidism of renal origin: Secondary | ICD-10-CM | POA: Diagnosis present

## 2024-06-28 DIAGNOSIS — I132 Hypertensive heart and chronic kidney disease with heart failure and with stage 5 chronic kidney disease, or end stage renal disease: Secondary | ICD-10-CM | POA: Diagnosis present

## 2024-06-28 DIAGNOSIS — M869 Osteomyelitis, unspecified: Secondary | ICD-10-CM | POA: Diagnosis present

## 2024-06-28 DIAGNOSIS — Z79899 Other long term (current) drug therapy: Secondary | ICD-10-CM

## 2024-06-28 DIAGNOSIS — Z794 Long term (current) use of insulin: Secondary | ICD-10-CM

## 2024-06-28 DIAGNOSIS — Z992 Dependence on renal dialysis: Secondary | ICD-10-CM

## 2024-06-28 DIAGNOSIS — Z89512 Acquired absence of left leg below knee: Secondary | ICD-10-CM

## 2024-06-28 DIAGNOSIS — M726 Necrotizing fasciitis: Secondary | ICD-10-CM | POA: Diagnosis not present

## 2024-06-28 DIAGNOSIS — D631 Anemia in chronic kidney disease: Secondary | ICD-10-CM | POA: Diagnosis present

## 2024-06-28 DIAGNOSIS — L03115 Cellulitis of right lower limb: Secondary | ICD-10-CM | POA: Diagnosis present

## 2024-06-28 DIAGNOSIS — E1142 Type 2 diabetes mellitus with diabetic polyneuropathy: Secondary | ICD-10-CM | POA: Diagnosis present

## 2024-06-28 DIAGNOSIS — F419 Anxiety disorder, unspecified: Secondary | ICD-10-CM | POA: Diagnosis present

## 2024-06-28 DIAGNOSIS — E663 Overweight: Secondary | ICD-10-CM | POA: Diagnosis present

## 2024-06-28 DIAGNOSIS — G894 Chronic pain syndrome: Secondary | ICD-10-CM | POA: Diagnosis present

## 2024-06-28 DIAGNOSIS — E1122 Type 2 diabetes mellitus with diabetic chronic kidney disease: Secondary | ICD-10-CM | POA: Diagnosis present

## 2024-06-28 DIAGNOSIS — L97519 Non-pressure chronic ulcer of other part of right foot with unspecified severity: Secondary | ICD-10-CM | POA: Diagnosis present

## 2024-06-28 DIAGNOSIS — Z89511 Acquired absence of right leg below knee: Secondary | ICD-10-CM

## 2024-06-28 DIAGNOSIS — I272 Pulmonary hypertension, unspecified: Secondary | ICD-10-CM | POA: Diagnosis present

## 2024-06-28 DIAGNOSIS — D62 Acute posthemorrhagic anemia: Secondary | ICD-10-CM | POA: Diagnosis not present

## 2024-06-28 DIAGNOSIS — A480 Gas gangrene: Secondary | ICD-10-CM | POA: Diagnosis present

## 2024-06-28 DIAGNOSIS — E1140 Type 2 diabetes mellitus with diabetic neuropathy, unspecified: Secondary | ICD-10-CM | POA: Diagnosis present

## 2024-06-28 DIAGNOSIS — I129 Hypertensive chronic kidney disease with stage 1 through stage 4 chronic kidney disease, or unspecified chronic kidney disease: Secondary | ICD-10-CM | POA: Diagnosis not present

## 2024-06-28 DIAGNOSIS — I2510 Atherosclerotic heart disease of native coronary artery without angina pectoris: Secondary | ICD-10-CM | POA: Diagnosis present

## 2024-06-28 DIAGNOSIS — F17210 Nicotine dependence, cigarettes, uncomplicated: Secondary | ICD-10-CM | POA: Diagnosis present

## 2024-06-28 DIAGNOSIS — D638 Anemia in other chronic diseases classified elsewhere: Secondary | ICD-10-CM

## 2024-06-28 DIAGNOSIS — N3001 Acute cystitis with hematuria: Secondary | ICD-10-CM | POA: Diagnosis not present

## 2024-06-28 DIAGNOSIS — N186 End stage renal disease: Secondary | ICD-10-CM | POA: Diagnosis present

## 2024-06-28 DIAGNOSIS — E861 Hypovolemia: Secondary | ICD-10-CM | POA: Diagnosis present

## 2024-06-28 DIAGNOSIS — N185 Chronic kidney disease, stage 5: Secondary | ICD-10-CM | POA: Diagnosis not present

## 2024-06-28 DIAGNOSIS — I959 Hypotension, unspecified: Secondary | ICD-10-CM | POA: Diagnosis not present

## 2024-06-28 DIAGNOSIS — I5023 Acute on chronic systolic (congestive) heart failure: Secondary | ICD-10-CM

## 2024-06-28 DIAGNOSIS — E785 Hyperlipidemia, unspecified: Secondary | ICD-10-CM | POA: Diagnosis present

## 2024-06-28 LAB — COMPREHENSIVE METABOLIC PANEL WITH GFR
ALT: <5 U/L (ref 0–44)
AST: 24 U/L (ref 15–41)
Albumin: 2.4 g/dL — ABNORMAL LOW (ref 3.5–5.0)
Alkaline Phosphatase: 165 U/L — ABNORMAL HIGH (ref 38–126)
Anion gap: 13 (ref 5–15)
BUN: 22 mg/dL — ABNORMAL HIGH (ref 6–20)
CO2: 27 mmol/L (ref 22–32)
Calcium: 8.2 mg/dL — ABNORMAL LOW (ref 8.9–10.3)
Chloride: 97 mmol/L — ABNORMAL LOW (ref 98–111)
Creatinine, Ser: 4.71 mg/dL — ABNORMAL HIGH (ref 0.61–1.24)
GFR, Estimated: 14 mL/min — ABNORMAL LOW
Glucose, Bld: 108 mg/dL — ABNORMAL HIGH (ref 70–99)
Potassium: 3.1 mmol/L — ABNORMAL LOW (ref 3.5–5.1)
Sodium: 137 mmol/L (ref 135–145)
Total Bilirubin: 1.7 mg/dL — ABNORMAL HIGH (ref 0.0–1.2)
Total Protein: 6.4 g/dL — ABNORMAL LOW (ref 6.5–8.1)

## 2024-06-28 LAB — URINALYSIS, W/ REFLEX TO CULTURE (INFECTION SUSPECTED)
Bilirubin Urine: NEGATIVE
Glucose, UA: NEGATIVE mg/dL
Ketones, ur: NEGATIVE mg/dL
Nitrite: NEGATIVE
Protein, ur: 100 mg/dL — AB
Specific Gravity, Urine: 1.016 (ref 1.005–1.030)
WBC, UA: >50 WBC/hpf (ref 0–5)
pH: 7 (ref 5.0–8.0)

## 2024-06-28 LAB — CBC WITH DIFFERENTIAL/PLATELET
Abs Immature Granulocytes: 0.69 K/uL — ABNORMAL HIGH (ref 0.00–0.07)
Basophils Absolute: 0.1 K/uL (ref 0.0–0.1)
Basophils Relative: 0 %
Eosinophils Absolute: 0.1 K/uL (ref 0.0–0.5)
Eosinophils Relative: 0 %
HCT: 31.1 % — ABNORMAL LOW (ref 39.0–52.0)
Hemoglobin: 9.4 g/dL — ABNORMAL LOW (ref 13.0–17.0)
Immature Granulocytes: 2 %
Lymphocytes Relative: 3 %
Lymphs Abs: 0.9 K/uL (ref 0.7–4.0)
MCH: 28.3 pg (ref 26.0–34.0)
MCHC: 30.2 g/dL (ref 30.0–36.0)
MCV: 93.7 fL (ref 80.0–100.0)
Monocytes Absolute: 1.1 K/uL — ABNORMAL HIGH (ref 0.1–1.0)
Monocytes Relative: 4 %
Neutro Abs: 27.4 K/uL — ABNORMAL HIGH (ref 1.7–7.7)
Neutrophils Relative %: 91 %
Platelets: 112 K/uL — ABNORMAL LOW (ref 150–400)
RBC: 3.32 MIL/uL — ABNORMAL LOW (ref 4.22–5.81)
RDW: 16.5 % — ABNORMAL HIGH (ref 11.5–15.5)
WBC: 30.2 K/uL — ABNORMAL HIGH (ref 4.0–10.5)
nRBC: 0 % (ref 0.0–0.2)

## 2024-06-28 LAB — RESP PANEL BY RT-PCR (RSV, FLU A&B, COVID)  RVPGX2
Influenza A by PCR: NEGATIVE
Influenza B by PCR: NEGATIVE
Resp Syncytial Virus by PCR: NEGATIVE
SARS Coronavirus 2 by RT PCR: NEGATIVE

## 2024-06-28 LAB — ETHANOL: Alcohol, Ethyl (B): <15 mg/dL

## 2024-06-28 LAB — AMMONIA: Ammonia: 21 umol/L (ref 9–35)

## 2024-06-28 MED ORDER — CALCITRIOL 0.25 MCG PO CAPS
0.2500 ug | ORAL_CAPSULE | Freq: Two times a day (BID) | ORAL | Status: DC
  Administered 2024-06-28 – 2024-07-07 (×14): 0.25 ug via ORAL
  Filled 2024-06-28 (×7): qty 1

## 2024-06-28 MED ORDER — ENOXAPARIN SODIUM 30 MG/0.3ML IJ SOSY
30.0000 mg | PREFILLED_SYRINGE | INTRAMUSCULAR | Status: DC
  Administered 2024-06-29 – 2024-07-07 (×7): 30 mg via SUBCUTANEOUS
  Filled 2024-06-28: qty 0.3

## 2024-06-28 MED ORDER — ISOSORBIDE MONONITRATE ER 60 MG PO TB24
60.0000 mg | ORAL_TABLET | Freq: Every day | ORAL | Status: DC
  Filled 2024-06-28: qty 1

## 2024-06-28 MED ORDER — ACETAMINOPHEN 325 MG PO TABS
650.0000 mg | ORAL_TABLET | Freq: Four times a day (QID) | ORAL | Status: DC | PRN
  Administered 2024-07-02 – 2024-07-06 (×3): 650 mg via ORAL

## 2024-06-28 MED ORDER — VANCOMYCIN HCL 1750 MG/350ML IV SOLN
1750.0000 mg | Freq: Once | INTRAVENOUS | Status: AC
  Administered 2024-06-28: 1750 mg via INTRAVENOUS
  Filled 2024-06-28: qty 350

## 2024-06-28 MED ORDER — HYDROXYZINE HCL 25 MG PO TABS: 50.0000 mg | ORAL_TABLET | Freq: Three times a day (TID) | ORAL | Status: DC | PRN

## 2024-06-28 MED ORDER — HYDRALAZINE HCL 25 MG PO TABS
50.0000 mg | ORAL_TABLET | Freq: Three times a day (TID) | ORAL | Status: DC
  Filled 2024-06-28 (×2): qty 2

## 2024-06-28 MED ORDER — TORSEMIDE 20 MG PO TABS: 80.0000 mg | ORAL_TABLET | Freq: Every day | ORAL | Status: DC

## 2024-06-28 MED ORDER — ATORVASTATIN CALCIUM 10 MG PO TABS: 20.0000 mg | ORAL_TABLET | Freq: Every day | ORAL | Status: DC

## 2024-06-28 MED ORDER — SODIUM CHLORIDE 0.9 % IV SOLN
2.0000 g | INTRAVENOUS | Status: DC
  Administered 2024-06-29 – 2024-07-03 (×4): 2 g via INTRAVENOUS
  Filled 2024-06-28 (×2): qty 20

## 2024-06-28 MED ORDER — GABAPENTIN 300 MG PO CAPS
600.0000 mg | ORAL_CAPSULE | Freq: Two times a day (BID) | ORAL | Status: DC
  Administered 2024-06-28: 600 mg via ORAL
  Filled 2024-06-28 (×2): qty 2

## 2024-06-28 MED ORDER — FUROSEMIDE 40 MG PO TABS
40.0000 mg | ORAL_TABLET | Freq: Two times a day (BID) | ORAL | Status: DC
  Administered 2024-06-28: 40 mg via ORAL
  Filled 2024-06-28: qty 1

## 2024-06-28 MED ORDER — BACLOFEN 10 MG PO TABS
5.0000 mg | ORAL_TABLET | Freq: Two times a day (BID) | ORAL | Status: DC | PRN
  Administered 2024-06-29: 5 mg via ORAL
  Filled 2024-06-28: qty 1

## 2024-06-28 MED ORDER — CARVEDILOL 12.5 MG PO TABS
25.0000 mg | ORAL_TABLET | Freq: Two times a day (BID) | ORAL | Status: DC
  Filled 2024-06-28: qty 2

## 2024-06-28 MED ORDER — SODIUM CHLORIDE 0.9 % IV SOLN
2.0000 g | Freq: Once | INTRAVENOUS | Status: AC
  Administered 2024-06-28: 2 g via INTRAVENOUS
  Filled 2024-06-28: qty 20

## 2024-06-28 MED ORDER — ACETAMINOPHEN 650 MG RE SUPP: 650.0000 mg | Freq: Four times a day (QID) | RECTAL | Status: DC | PRN

## 2024-06-28 MED ORDER — OXYCODONE HCL 5 MG PO TABS
5.0000 mg | ORAL_TABLET | ORAL | Status: DC | PRN
  Administered 2024-07-01 – 2024-07-06 (×9): 5 mg via ORAL
  Filled 2024-06-28 (×2): qty 1

## 2024-06-28 MED ORDER — VANCOMYCIN VARIABLE DOSE PER UNSTABLE RENAL FUNCTION (PHARMACIST DOSING): Status: DC

## 2024-06-28 MED ORDER — ONDANSETRON HCL 4 MG/2ML IJ SOLN: 4.0000 mg | Freq: Four times a day (QID) | INTRAMUSCULAR | Status: DC | PRN

## 2024-06-28 MED ORDER — ONDANSETRON HCL 4 MG PO TABS
4.0000 mg | ORAL_TABLET | Freq: Four times a day (QID) | ORAL | Status: DC | PRN
  Administered 2024-07-05: 4 mg via ORAL

## 2024-06-28 MED ORDER — AMLODIPINE BESYLATE 5 MG PO TABS: 10.0000 mg | ORAL_TABLET | Freq: Every day | ORAL | Status: DC

## 2024-06-28 NOTE — ED Provider Notes (Signed)
 " [place] EMERGENCY DEPARTMENT AT [facility] Provider Note  CSN: [id number] Arrival date & time: 06/28/24 1747  Chief Complaint(s) Abdominal Pain, Emesis, and Diarrhea  HPI Todd Mccoy is a 53 y.o. male who is here today for nausea and vomiting.  Patient reports that he has felt nauseated over the last few days.  He has a history of heart failure, chronic kidney disease left BKA currently being treated for osteomyelitis of the right foot by podiatry.  Here today with niece who helps provide history.   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/2020   pt states recent MD visit /left eye worse than right   Dyslipidemia 12/10/2020   Hx of BKA, left (HCC)    Hypertension    Morbid obesity (HCC)    Osteomyelitis (HCC)    Pneumonia    Pulmonary HTN (HCC)    mild to moderate (PASP50mmHg) by echo 07/2023   Patient Active Problem List   Diagnosis Date Noted   Elevated alkaline phosphatase level 12/21/2023   Elevated LFTs 12/21/2023   Skin ulcer of right great toe, limited to breakdown of skin (HCC) 10/07/2023   Diabetic polyneuropathy associated with diabetes mellitus due to underlying condition (HCC) 10/05/2023   Other insomnia 10/05/2023   Bilateral carpal tunnel syndrome 10/05/2023   Cubital tunnel syndrome, bilateral 10/05/2023   Chronic pain syndrome 10/05/2023   Encounter for pain management 10/05/2023   Pulmonary HTN (HCC)    Secondary hyperparathyroidism  of renal origin 02/24/2023   End stage renal disease (HCC) 02/19/2023   Acquired absence of left leg below knee (HCC) 02/14/2023   Acquired absence of other right toe(s) 02/14/2023   Dependence on renal dialysis 02/14/2023   Hyperlipidemia, unspecified 02/14/2023   Hypertensive heart and chronic kidney disease with heart failure and with stage 5 chronic kidney disease, or end stage renal disease (HCC) 02/14/2023   Nicotine dependence, cigarettes, uncomplicated 02/14/2023   Type 2 diabetes mellitus with diabetic neuropathy, unspecified (HCC) 02/14/2023   Type 2 diabetes mellitus with diabetic peripheral angiopathy without gangrene (HCC) 02/14/2023   Type 2 diabetes mellitus with unspecified diabetic retinopathy without macular edema (HCC) 02/14/2023   Diarrhea of presumed infectious origin 02/08/2023   Metabolic acidosis 02/08/2023   Anemia of chronic disease 02/08/2023   Acute renal failure superimposed on chronic kidney disease 02/08/2023   Encephalopathy acute 02/07/2023   Shock circulatory (HCC) 02/05/2023   PAD (peripheral artery disease) 10/27/2022   Renal insufficiency    Acute on chronic systolic CHF (congestive heart failure) (HCC) 01/23/2022   Prolonged QT interval 01/23/2022   Chest pain 01/23/2022   Elevated troponin 01/23/2022   Anemia due to chronic kidney disease 01/23/2022   Hypertensive emergency 01/23/2022   Hyponatremia 01/23/2022   Chronic combined systolic (congestive) and diastolic (congestive) heart failure (HCC) 04/07/2021   Chronic renal disease, stage 4, severely decreased glomerular filtration rate between 15-29 mL/min/1.73 square meter (HCC)  Hypertensive urgency    Acute kidney injury superimposed on chronic kidney disease    Acute CHF (congestive heart failure) (HCC) 02/27/2021   Normocytic anemia 02/27/2021   Anasarca 02/27/2021   Abscess of left foot 12/10/2020   S/P BKA (below knee amputation) unilateral, left (HCC) 12/10/2020   Dyslipidemia  12/10/2020   CKD (chronic kidney disease), stage III (HCC) 12/10/2020   HTN (hypertension) 11/12/2020   Cellulitis and abscess of foot    Diabetes mellitus with hyperglycemia (HCC) 09/07/2014   Tobacco abuse 09/07/2014   Abscess of back    Abscess of lower back 09/06/2014   DM2 (diabetes mellitus, type 2) (HCC) 09/06/2014   Sepsis (HCC) 09/06/2014   Scrotal abscess 06/20/2014   Home Medication(s) Prior to Admission medications  Medication Sig Start Date End Date Taking? Authorizing Provider  amLODipine  (NORVASC ) 10 MG tablet Take 1 tablet (10 mg total) by mouth daily. 06/20/23   [name], [name], MD  atorvastatin  (LIPITOR) 20 MG tablet TAKE 1 TABLET (20 MG TOTAL) BY MOUTH DAILY. 05/16/23   Wyn Jackee VEAR Mickey., NP  Baclofen  5 MG TABS Take 1 tablet (5 mg total) by mouth 2 (two) times daily as needed. 12/22/23   Rising, Asberry, PA-C  calcitRIOL  (ROCALTROL ) 0.25 MCG capsule Take 1 capsule (0.25 mcg total) by mouth 2 (two) times daily. 07/28/23   Shlomo Wilbert SAUNDERS, MD  carvedilol  (COREG ) 25 MG tablet Take 1 tablet (25 mg total) by mouth 2 (two) times daily with a meal. 02/23/24   Turner, Wilbert SAUNDERS, MD  docusate sodium  (COLACE) 100 MG capsule Take 1 capsule (100 mg total) by mouth 2 (two) times daily as needed for mild constipation. 02/14/23   Lue Elsie BROCKS, MD  doxycycline  (VIBRA -TABS) 100 MG tablet Take 1 tablet (100 mg total) by mouth 2 (two) times daily for 14 days. 06/26/24 07/10/24  Magdalen Prentice PARAS, DPM  ferrous sulfate  325 (65 FE) MG EC tablet Take 1 tablet (325 mg total) by mouth daily with breakfast. 08/31/23   Danton Jon HERO, PA-C  furosemide  (LASIX ) 40 MG tablet Take 1 tablet by mouth twice daily 06/25/24   [name], [name], MD  gabapentin  (NEURONTIN ) 300 MG capsule Take 2 capsules by mouth twice daily 05/21/24   [name], [name], MD  gentamicin ointment (GARAMYCIN) 0.1 % SMARTSIG:sparingly Topical 11/23/23   [provider]  hydrALAZINE  (APRESOLINE ) 50 MG tablet Take 50 mg by  mouth 3 (three) times daily. 02/19/23   [provider]  hydrOXYzine  (ATARAX ) 50 MG tablet Take 1 tablet (50 mg total) by mouth 3 (three) times daily as needed for anxiety or itching. 08/31/23   Danton Jon HERO, PA-C  insulin  glargine (LANTUS  SOLOSTAR) 100 UNIT/ML Solostar Pen Inject 5 Units into the skin at bedtime. 06/19/24   [name], [name], MD  isosorbide  mononitrate (IMDUR ) 60 MG 24 hr tablet Take 1 tablet (60 mg total) by mouth daily. 08/31/23   Danton Jon HERO, PA-C  levofloxacin (LEVAQUIN) 250 MG tablet Take by mouth. 11/23/23   [provider]  lidocaine  (LIDODERM ) 5 % Place 1 patch onto the skin daily. Remove & Discard patch within 12 hours or as directed by MD 12/29/23   Delbert Clam, MD  magnesium  oxide (MAG-OX) 400 (240 Mg) MG tablet Take 1 tablet (400 mg total) by mouth 2 (two) times daily. 02/14/23   Lue Elsie BROCKS, MD  minocycline (MINOCIN) 100 MG capsule Take 100 mg by mouth 2 (two) times daily. 10/29/23   [provider]  multivitamin (RENA-VIT) TABS  tablet Take 1 tablet by mouth at bedtime. 02/14/23   Lue Elsie BROCKS, MD  nortriptyline  (PAMELOR ) 25 MG capsule Take 2 capsules (50 mg total) by mouth at bedtime. Start with 1 capsule at nighttime for 1 week, then if no side effects increase to 2 capsules at nighttime 10/05/23 06/26/24  Emeline Search C, DO  polyethylene glycol (MIRALAX  / GLYCOLAX ) 17 g packet Take 17 g by mouth daily as needed for moderate constipation. 02/14/23   Lue Elsie BROCKS, MD  torsemide  (DEMADEX ) 20 MG tablet Take 4 tablets (80 mg total) by mouth daily. 05/16/23   Wyn Jackee VEAR Mickey., NP                                                                                                                                    Past Surgical History Past Surgical History:  Procedure Laterality Date   ABSCESS DRAINAGE     neck   AMPUTATION Left 11/14/2020   Procedure: LEFT 5TH RAY AMPUTATION;  Surgeon: Harden Jerona GAILS, MD;  Location:  [facility] OR;  Service: Orthopedics;  Laterality: Left;   AMPUTATION Left 12/10/2020   Procedure: LEFT BELOW KNEE AMPUTATION;  Surgeon: Harden Jerona GAILS, MD;  Location: [facility] OR;  Service: Orthopedics;  Laterality: Left;   AMPUTATION TOE Right 03/25/2020   Procedure: AMPUTATION TOE;  Surgeon: Gretel Ozell PARAS, DPM;  Location: [facility] ORS;  Service: Podiatry;  Laterality: Right;   AV FISTULA PLACEMENT Right 05/23/2023   Procedure: RIGHT ARM Brachiocephalic ARTERIOVENOUS (AV) FISTULA CREATION;  Surgeon: Lanis Fonda BRAVO, MD;  Location: [facility] OR;  Service: Vascular;  Laterality: Right;   INCISION AND DRAINAGE ABSCESS Right 09/07/2014   Procedure: INCISION AND DRAINAGE ABSCESS RIGHT FLANK;  Surgeon: Krystal Russell, MD;  Location: [facility] ORS;  Service: General;  Laterality: Right;   IR FLUORO GUIDE CV LINE RIGHT  02/11/2023   IR PARACENTESIS  10/03/2023   IR PARACENTESIS  10/19/2023   IR PARACENTESIS  11/07/2023   IR PARACENTESIS  11/30/2023   IR PARACENTESIS  12/23/2023   IR PARACENTESIS  02/03/2024   IR PARACENTESIS  04/16/2024   IR PARACENTESIS  05/14/2024   IR REMOVAL TUN CV CATH W/O FL  11/30/2023   IR US  GUIDE VASC ACCESS RIGHT  02/11/2023   LEG AMPUTATION BELOW KNEE Left 12/10/2020   SCROTUM EXPLORATION     Family History Family History  Problem Relation Age of Onset   Diabetes Mother    Diabetes Father    Diabetes Brother    Colon cancer Neg Hx    Esophageal cancer Neg Hx    Pancreatic cancer Neg Hx    Stomach cancer Neg Hx    Liver disease Neg Hx    CAD Neg Hx     Social History Social History[1] Allergies Patient has no known allergies.  Review of Systems Review of Systems  Physical Exam Vital Signs  I have reviewed the triage vital  signs BP 112/64 (BP Location: Left Arm)   Pulse 75   Temp 97.9 F (36.6 C) (Oral)   Resp (!) 21   Ht 5' 10 (1.778 m)   SpO2 100%   BMI 27.20 kg/m   Physical Exam Vitals and nursing note reviewed.  Constitutional:      Appearance: He is not toxic-appearing.   HENT:     Head: Normocephalic.  Cardiovascular:     Rate and Rhythm: Normal rate and regular rhythm.  Abdominal:     General: Abdomen is flat. There is no distension.     Tenderness: There is no abdominal tenderness.     Hernia: No hernia is present.  Skin:    General: Skin is warm.  Neurological:     General: No focal deficit present.     Mental Status: He is alert.     ED Results and Treatments Labs (all labs ordered are listed, but only abnormal results are displayed) Labs Reviewed  COMPREHENSIVE METABOLIC PANEL WITH GFR - Abnormal; Notable for the following components:      Result Value   Potassium 3.1 (*)    Chloride 97 (*)    Glucose, Bld 108 (*)    BUN 22 (*)    Creatinine, Ser 4.71 (*)    Calcium  8.2 (*)    Total Protein 6.4 (*)    Albumin  2.4 (*)    Alkaline Phosphatase 165 (*)    Total Bilirubin 1.7 (*)    GFR, Estimated 14 (*)    All other components within normal limits  CBC WITH DIFFERENTIAL/PLATELET - Abnormal; Notable for the following components:   WBC 30.2 (*)    RBC 3.32 (*)    Hemoglobin 9.4 (*)    HCT 31.1 (*)    RDW 16.5 (*)    Platelets 112 (*)    Neutro Abs 27.4 (*)    Monocytes Absolute 1.1 (*)    Abs Immature Granulocytes 0.69 (*)    All other components within normal limits  URINALYSIS, W/ REFLEX TO CULTURE (INFECTION SUSPECTED) - Abnormal; Notable for the following components:   APPearance TURBID (*)    Hgb urine dipstick SMALL (*)    Protein, ur 100 (*)    Leukocytes,Ua SMALL (*)    Bacteria, UA MANY (*)    All other components within normal limits  RESP PANEL BY RT-PCR (RSV, FLU A&B, COVID)  RVPGX2  URINE CULTURE  ETHANOL  AMMONIA                                                                                                                          Radiology CT ABDOMEN PELVIS WO CONTRAST Result Date: 06/28/2024 EXAM: CT ABDOMEN AND PELVIS WITHOUT CONTRAST 06/28/2024 06:28:43 PM TECHNIQUE: CT of the abdomen and pelvis was  performed without the administration of intravenous contrast. Multiplanar reformatted images are provided for review. Automated exposure control, iterative reconstruction, and/or weight-based adjustment of the mA/kV was utilized to reduce the radiation  dose to as low as reasonably achievable. COMPARISON: 02/04/2023 CLINICAL HISTORY: Bowel obstruction suspected. Nausea, vomiting, diarrhea, abdominal pain FINDINGS: LOWER CHEST: Small - moderate right pleural effusion. Cardiomegaly. Interlobular septal thickening and ground glass opacities in the lower lobes suggestive of edema. LIVER: Marked hepatomegaly. Hepatic steatosis. GALLBLADDER AND BILE DUCTS: Sludge in the gallbladder. No evidence of acute cholecystitis. No biliary ductal dilatation. SPLEEN: The spleen is mildly enlarged measuring 14.5 cm in craniocaudal dimension. PANCREAS: No acute abnormality. ADRENAL GLANDS: No acute abnormality. KIDNEYS, URETERS AND BLADDER: No stones in the kidneys or ureters. No hydronephrosis. No perinephric or periureteral stranding. Diffuse bladder wall thickening of the nondistended bladder. Trace adjacent perivesical stranding. GI AND BOWEL: Stomach demonstrates no acute abnormality. There is no bowel obstruction. PERITONEUM AND RETROPERITONEUM: Small volume or abdominal pelvic ascites. No free air. VASCULATURE: Aorta is normal in caliber. Aortic atherosclerotic calcification. LYMPH NODES: 1.2 cm right iliac chain lymph node on series 2 image 75 has increased from 02/04/2023 when it measured 1.0 cm and 1.5 cm right iliac chain lymph node on series 2 image 60 was not well visualized previously due to ascites. Additional shotty subcentimeter retroperitoneal and iliac chain lymph nodes are similar. Marked enlargement of a right inguinal lymph node on series 2 image 98 measuring 2.4 cm. This is similar to 02/04/2023. Given relative stability since 02/04/2023 these are favored reactive. REPRODUCTIVE ORGANS: No acute abnormality. BONES  AND SOFT TISSUES: No acute osseous abnormality. Body wall edema. No focal soft tissue abnormality. IMPRESSION: 1. No evidence of bowel obstruction. 2. Bladder wall thickening and trace perivesical stranding. Correlate for cystitis. 3. Hepatosplenomegaly. Small volume abdominal and pelvic ascites. 4. Small - moderate effusion with pulmonary edema in the lower lungs. Cardiomegaly. Electronically signed by: Norman Gatlin MD 06/28/2024 06:46 PM EST RP Workstation: [id number]    Pertinent labs & imaging results that were available during my care of the patient were reviewed by me and considered in my medical decision making (see MDM for details).  Medications Ordered in ED Medications  vancomycin  (VANCOREADY) IVPB 1750 mg/350 mL (has no administration in time range)  vancomycin  variable dose per unstable renal function (pharmacist dosing) (has no administration in time range)  cefTRIAXone  (ROCEPHIN ) 2 g in sodium chloride  0.9 % 100 mL IVPB (2 g Intravenous New Bag/Given 06/28/24 1935)                                                                                                                                     Procedures Procedures  (including critical care time)  Medical Decision Making / ED Course   This patient presents to the ED for concern of nausea and vomiting, this involves an extensive number of treatment options, and is a complaint that carries with it a high risk of complications and morbidity.  The differential diagnosis includes enteritis, gastritis, gastroenteritis, consider ACS, electrolyte abnormalities.  MDM: Patient chronically ill.  I took down  the dressing on the patient's left foot, overall appears unchanged from pictures in the chart of his recent podiatry appointment.  He is afebrile.  Will obtain imaging of the patient's abdomen and pelvis.  Anticipate he likely will have some electrolyte abnormalities.  Do not believe this patient is sepsis at this  time.  Reassessment 7:15 PM-patient has a white count of 30.  Have started him on Rocephin .  CT imaging concerning for stranding around the bladder, pending urinalysis.  Other potential sources for the patient do include osteomyelitis.  I have lower suspicion for necrotizing soft tissue infection at this time.  I do not appreciate any crepitus.  Discussed this with the patient, and he is amenable to admission when his workup is further completed.  I do not believe this patient is sepsis at this time.  He is afebrile, normotensive and nontachycardic.   His foot is warm and well-perfused. Do not have MRI available to me this evening.  Patient was being managed on an outpatient basis with outpatient MRI scheduled.  I believe it is appropriate for him to wait until the morning for MRI of the right lower extremity.  Reassessment 9:50 PM-patient's urine does appear infected.  I started him on antibiotics.  Given the concern for ulcer lower extremity cellulitis versus osteomyelitis have started the patient on vancomycin .  I have ordered an MRI for him to receive in the morning.  Will admit patient to the hospitalist service.   Additional history obtained: -Additional history obtained from family at bedside -External records from outside source obtained and reviewed including: Chart review including previous notes, labs, imaging, consultation notes   Lab Tests: -I ordered, reviewed, and interpreted labs.   The pertinent results include:   Labs Reviewed  COMPREHENSIVE METABOLIC PANEL WITH GFR - Abnormal; Notable for the following components:      Result Value   Potassium 3.1 (*)    Chloride 97 (*)    Glucose, Bld 108 (*)    BUN 22 (*)    Creatinine, Ser 4.71 (*)    Calcium  8.2 (*)    Total Protein 6.4 (*)    Albumin  2.4 (*)    Alkaline Phosphatase 165 (*)    Total Bilirubin 1.7 (*)    GFR, Estimated 14 (*)    All other components within normal limits  CBC WITH DIFFERENTIAL/PLATELET -  Abnormal; Notable for the following components:   WBC 30.2 (*)    RBC 3.32 (*)    Hemoglobin 9.4 (*)    HCT 31.1 (*)    RDW 16.5 (*)    Platelets 112 (*)    Neutro Abs 27.4 (*)    Monocytes Absolute 1.1 (*)    Abs Immature Granulocytes 0.69 (*)    All other components within normal limits  URINALYSIS, W/ REFLEX TO CULTURE (INFECTION SUSPECTED) - Abnormal; Notable for the following components:   APPearance TURBID (*)    Hgb urine dipstick SMALL (*)    Protein, ur 100 (*)    Leukocytes,Ua SMALL (*)    Bacteria, UA MANY (*)    All other components within normal limits  RESP PANEL BY RT-PCR (RSV, FLU A&B, COVID)  RVPGX2  URINE CULTURE  ETHANOL  AMMONIA      EKG no acute ischemia, no changes from June of this past year  EKG Interpretation Date/Time:  Thursday June 28 2024 18:17:47 EST Ventricular Rate:  72 PR Interval:  181 QRS Duration:  121 QT Interval:  459 QTC Calculation:  503 R Axis:   178  Text Interpretation: Sinus rhythm Nonspecific intraventricular conduction delay Borderline repolarization abnormality Confirmed by Mannie Pac [id number]) on 06/28/2024 7:14:04 PM         Imaging Studies ordered: I ordered imaging studies including CT abdomen pelvis I independently visualized and interpreted imaging. I agree with the radiologist interpretation   Medicines ordered and prescription drug management: Meds ordered this encounter  Medications   cefTRIAXone  (ROCEPHIN ) 2 g in sodium chloride  0.9 % 100 mL IVPB    Antibiotic Indication::   UTI   vancomycin  (VANCOREADY) IVPB 1750 mg/350 mL    Indication::   Wound Infection   vancomycin  variable dose per unstable renal function (pharmacist dosing)    -I have reviewed the patients home medicines and have made adjustments as needed  Cardiac Monitoring: The patient was maintained on a cardiac monitor.  I personally viewed and interpreted the cardiac monitored which showed an underlying rhythm of: Normal sinus  rhythm  Social Determinants of Health:  Factors impacting patients care include: Multiple medical comorbidities including end-stage renal disease, BKA   Reevaluation: After the interventions noted above, I reevaluated the patient and found that they have :improved  Co morbidities that complicate the patient evaluation  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/2020   pt states recent MD visit /left eye worse than right   Dyslipidemia 12/10/2020   Hx of BKA, left (HCC)    Hypertension    Morbid obesity (HCC)    Osteomyelitis (HCC)    Pneumonia    Pulmonary HTN (HCC)    mild to moderate (PASP4mmHg) by echo 07/2023        Final Clinical Impression(s) / ED Diagnoses Final diagnoses:  Acute cystitis with hematuria  Other acute osteomyelitis of right foot (HCC)     @PCDICTATION @     [1]  Social History Tobacco Use   Smoking status: Every Day    Current packs/day: 0.00    Average packs/day: 0.5 packs/day for 32.0 years (16.0 ttl pk-yrs)    Types: Cigarettes    Start date: 08/24/1989    Last attempt to quit: 08/24/2021    Years since quitting: 2.8    Passive exposure: Past   Smokeless tobacco: Former   Tobacco comments:    Quit smoking previously February 2023  Vaping Use   Vaping status: Never Used  Substance Use Topics   Alcohol  use: No   Drug use: No     Mannie Pac T, DO 06/28/24 2159  "

## 2024-06-28 NOTE — Progress Notes (Signed)
 Pharmacy Antibiotic Note  Todd Mccoy is a 53 y.o. male admitted on 06/28/2024 with wound infection.  Pharmacy has been consulted for vancomycin  dosing.  Plan: Vancomycin  1750mg  IV x 1  Obtain vanc level in 24-48 hours for further dosing Follow renal function  Height: 5' 10 (177.8 cm) IBW/kg (Calculated) : 73  Temp (24hrs), Avg:97.9 F (36.6 C), Min:97.9 F (36.6 C), Max:97.9 F (36.6 C)  Recent Labs  Lab 06/28/24 1833  WBC 30.2*  CREATININE 4.71*    Estimated Creatinine Clearance: 19.2 mL/min (A) (by C-G formula based on SCr of 4.71 mg/dL (H)).    Allergies[1]  Antimicrobials this admission: 12/25 vanc >> 12/25 CTX x1  Dose adjustments this admission:   Microbiology results:   Thank you for allowing pharmacy to be a part of this patients care. Leeroy Mace RPh 06/28/2024, 9:32 PM     [1] No Known Allergies

## 2024-06-28 NOTE — H&P (Signed)
 " History and Physical    Todd Mccoy FMW:[id number] DOB: March 02, 1971 DOA: 06/28/2024  PCP: Delbert Clam, MD   Chief Complaint: n/v  HPI: Todd Mccoy is a 53 y.o. male with medical history significant of CHF, type 2 diabetes, hyperlipidemia who presented to the emergency department due to nausea and vomiting.  Patient has been nauseous over the last several days.  He is currently undergoing treatment for osteomyelitis of his right foot by podiatry.  Due to feeling poorly he presented to the ER.  On arrival he was afebrile and hemodynamically stable.  Labs were obtained which showed creatinine 4.7 at baseline, total bilirubin 1.7, potassium 3.1, WBC 30.2, hemoglobin 9.4, platelets 112, COVID flu negative, urinalysis concerning for infection.  Patient underwent CT abdomen which showed no evidence of obstruction however cystitis.  Patient was admitted for further workup.   Review of Systems: Review of Systems  Constitutional:  Positive for malaise/fatigue and weight loss.  HENT: Negative.    Eyes: Negative.   Respiratory: Negative.    Cardiovascular: Negative.   Gastrointestinal: Negative.   Genitourinary: Negative.   Musculoskeletal: Negative.   Skin: Negative.   Neurological: Negative.   Endo/Heme/Allergies: Negative.   Psychiatric/Behavioral: Negative.       As per HPI otherwise 10 point review of systems negative.   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/2020    pt states recent MD visit /left eye worse than right   Dyslipidemia 12/10/2020   Hx of BKA, left (HCC)    Hypertension    Morbid obesity (HCC)    Osteomyelitis (HCC)    Pneumonia    Pulmonary HTN (HCC)    mild to moderate (PASP96mmHg) by echo 07/2023    Past Surgical History:  Procedure Laterality Date   ABSCESS DRAINAGE     neck   AMPUTATION Left 11/14/2020   Procedure: LEFT 5TH RAY AMPUTATION;  Surgeon: Harden Jerona GAILS, MD;  Location: [facility] OR;  Service: Orthopedics;  Laterality: Left;   AMPUTATION Left 12/10/2020   Procedure: LEFT BELOW KNEE AMPUTATION;  Surgeon: Harden Jerona GAILS, MD;  Location: [facility] OR;  Service: Orthopedics;  Laterality: Left;   AMPUTATION TOE Right 03/25/2020   Procedure: AMPUTATION TOE;  Surgeon: Gretel Ozell PARAS, DPM;  Location: [facility] ORS;  Service: Podiatry;  Laterality: Right;   AV FISTULA PLACEMENT Right 05/23/2023   Procedure: RIGHT ARM Brachiocephalic ARTERIOVENOUS (AV) FISTULA CREATION;  Surgeon: Lanis Fonda BRAVO, MD;  Location: [facility] OR;  Service: Vascular;  Laterality: Right;   INCISION AND DRAINAGE ABSCESS Right 09/07/2014   Procedure: INCISION AND DRAINAGE ABSCESS RIGHT FLANK;  Surgeon: Krystal Russell, MD;  Location: [facility] ORS;  Service: General;  Laterality: Right;   IR FLUORO GUIDE CV LINE RIGHT  02/11/2023   IR PARACENTESIS  10/03/2023   IR PARACENTESIS  10/19/2023   IR PARACENTESIS  11/07/2023   IR PARACENTESIS  11/30/2023   IR PARACENTESIS  12/23/2023   IR  PARACENTESIS  02/03/2024   IR PARACENTESIS  04/16/2024   IR PARACENTESIS  05/14/2024   IR REMOVAL TUN CV CATH W/O FL  11/30/2023   IR US  GUIDE VASC ACCESS RIGHT  02/11/2023   LEG AMPUTATION BELOW KNEE Left 12/10/2020   SCROTUM EXPLORATION       reports that he has been smoking cigarettes. He started smoking about 34 years ago. He has a 16 pack-year smoking history. He has been exposed to tobacco smoke. He has quit using smokeless tobacco. He reports that he does not drink alcohol  and does not use drugs.  Family History   Problem Relation Age of Onset   Diabetes Mother    Diabetes Father    Diabetes Brother    Colon cancer Neg Hx    Esophageal cancer Neg Hx    Pancreatic cancer Neg Hx    Stomach cancer Neg Hx    Liver disease Neg Hx    CAD Neg Hx     Prior to Admission medications  Medication Sig Start Date End Date Taking? Authorizing Provider  amLODipine  (NORVASC ) 10 MG tablet Take 1 tablet (10 mg total) by mouth daily. 06/20/23   [name], [name], MD  atorvastatin  (LIPITOR) 20 MG tablet TAKE 1 TABLET (20 MG TOTAL) BY MOUTH DAILY. 05/16/23   Wyn Jackee VEAR Mickey., NP  Baclofen  5 MG TABS Take 1 tablet (5 mg total) by mouth 2 (two) times daily as needed. 12/22/23   Rising, Asberry, PA-C  calcitRIOL  (ROCALTROL ) 0.25 MCG capsule Take 1 capsule (0.25 mcg total) by mouth 2 (two) times daily. 07/28/23   Shlomo Wilbert SAUNDERS, MD  carvedilol  (COREG ) 25 MG tablet Take 1 tablet (25 mg total) by mouth 2 (two) times daily with a meal. 02/23/24   Turner, Wilbert SAUNDERS, MD  docusate sodium  (COLACE) 100 MG capsule Take 1 capsule (100 mg total) by mouth 2 (two) times daily as needed for mild constipation. 02/14/23   Lue Elsie BROCKS, MD  doxycycline  (VIBRA -TABS) 100 MG tablet Take 1 tablet (100 mg total) by mouth 2 (two) times daily for 14 days. 06/26/24 07/10/24  Magdalen Prentice PARAS, DPM  ferrous sulfate  325 (65 FE) MG EC tablet Take 1 tablet (325 mg total) by mouth daily with breakfast. 08/31/23   Danton Jon HERO, PA-C  furosemide  (LASIX ) 40 MG tablet Take 1 tablet by mouth twice daily 06/25/24   [name], [name], MD  gabapentin  (NEURONTIN ) 300 MG capsule Take 2 capsules by mouth twice daily 05/21/24   [name], [name], MD  gentamicin ointment (GARAMYCIN) 0.1 % SMARTSIG:sparingly Topical 11/23/23   [provider]  hydrALAZINE  (APRESOLINE ) 50 MG tablet Take 50 mg by mouth 3 (three) times daily. 02/19/23   [provider]  hydrOXYzine  (ATARAX ) 50 MG tablet Take 1 tablet (50 mg total) by mouth 3 (three) times daily as needed  for anxiety or itching. 08/31/23   Danton Jon HERO, PA-C  insulin  glargine (LANTUS  SOLOSTAR) 100 UNIT/ML Solostar Pen Inject 5 Units into the skin at bedtime. 06/19/24   [name], [name], MD  isosorbide  mononitrate (IMDUR ) 60 MG 24 hr tablet Take 1 tablet (60 mg total) by mouth daily. 08/31/23   Danton Jon HERO, PA-C  levofloxacin (LEVAQUIN) 250 MG tablet Take by mouth. 11/23/23   [provider]  lidocaine  (LIDODERM ) 5 % Place 1 patch onto the skin daily. Remove & Discard patch within 12 hours or as directed by MD 12/29/23   [name], [name], MD  magnesium  oxide (MAG-OX) 400 (240 Mg) MG tablet Take  1 tablet (400 mg total) by mouth 2 (two) times daily. 02/14/23   Lue Elsie BROCKS, MD  minocycline (MINOCIN) 100 MG capsule Take 100 mg by mouth 2 (two) times daily. 10/29/23   [provider]  multivitamin (RENA-VIT) TABS tablet Take 1 tablet by mouth at bedtime. 02/14/23   Lue Elsie BROCKS, MD  nortriptyline  (PAMELOR ) 25 MG capsule Take 2 capsules (50 mg total) by mouth at bedtime. Start with 1 capsule at nighttime for 1 week, then if no side effects increase to 2 capsules at nighttime 10/05/23 06/26/24  Emeline Search C, DO  polyethylene glycol (MIRALAX  / GLYCOLAX ) 17 g packet Take 17 g by mouth daily as needed for moderate constipation. 02/14/23   Lue Elsie BROCKS, MD  torsemide  (DEMADEX ) 20 MG tablet Take 4 tablets (80 mg total) by mouth daily. 05/16/23   Wyn Jackee VEAR Mickey., NP    Physical Exam: Vitals:   06/28/24 1803 06/28/24 2100  BP: 112/64   Pulse: 75   Resp: (!) 21   Temp: 97.9 F (36.6 C)   TempSrc: Oral   SpO2: 100%   Height:  5' 10 (1.778 m)   Physical Exam Constitutional:      Appearance: He is normal weight.  HENT:     Head: Normocephalic.     Mouth/Throat:     Mouth: Mucous membranes are moist.  Eyes:     Extraocular Movements: Extraocular movements intact.  Cardiovascular:     Rate and Rhythm: Normal rate and regular rhythm.  Pulmonary:      Effort: Pulmonary effort is normal.  Abdominal:     General: Abdomen is flat. Bowel sounds are normal.     Palpations: Abdomen is soft.  Skin:    General: Skin is warm.     Capillary Refill: Capillary refill takes less than 2 seconds.  Neurological:     General: No focal deficit present.     Mental Status: He is alert.  Psychiatric:        Mood and Affect: Mood normal.        Labs on Admission: I have personally reviewed the patients's labs and imaging studies.  Assessment/Plan Principal Problem:   Osteomyelitis (HCC)  # Diabetic foot ulcer, right - Patient seen by podiatry and underwent left below-knee amputation.  Was recently seen for right foot wound and had x-ray evidence of osteomyelitis.  He has a chronic wound and has been having wound changes at the wound care center.  He was taking antibiotics improvement.  Recently saw podiatry on 12/23 who recommended MR, ABI with possible amputation  Plan: Obtain MR foot Obtain inflamamatory markers Check ABIs Placed on vancomycin  and ceftriaxone   # Hypertension-continue amlodipine   # Hyperlipidemia-continue atorvastatin   # Possible UTI-continue ceftriaxone   # CAD-continue Imdur , hydralazine   # Chronic pain-continue gabapentin   # CKD stage V-trend creatinine, continue calcitriol   # History of heart failure reduced ejection fraction, not in exacerbation-continue home goal-directed medical therapy and torsemide  80 mg daily    Admission status: Emergency   Certification: The appropriate patient status for this patient is INPATIENT. Inpatient status is judged to be reasonable and necessary in order to provide the required intensity of service to ensure the patient's safety. The patient's presenting symptoms, physical exam findings, and initial radiographic and laboratory data in the context of their chronic comorbidities is felt to place them at high risk for further clinical deterioration. Furthermore, it is not  anticipated that the patient will be medically stable for discharge from  the hospital within 2 midnights of admission.   * I certify that at the point of admission it is my clinical judgment that the patient will require inpatient hospital care spanning beyond 2 midnights from the point of admission due to high intensity of service, high risk for further deterioration and high frequency of surveillance required.DEWAINE Lamar Dess MD [facility] If 7PM-7AM, please contact night-coverage [email or web address]  06/28/2024, 10:57 PM        [1] No Known Allergies  "

## 2024-06-28 NOTE — ED Triage Notes (Signed)
 Pt BIBA from home, c/o n/v/d and abdominal pain for a week. Family stated pt has been more altered than usual.   BP 102/60 CBG 145

## 2024-06-28 NOTE — ED Notes (Signed)
 Patient transported to CT

## 2024-06-29 ENCOUNTER — Inpatient Hospital Stay (HOSPITAL_COMMUNITY)

## 2024-06-29 ENCOUNTER — Encounter (HOSPITAL_BASED_OUTPATIENT_CLINIC_OR_DEPARTMENT_OTHER): Admitting: General Surgery

## 2024-06-29 DIAGNOSIS — N3001 Acute cystitis with hematuria: Secondary | ICD-10-CM

## 2024-06-29 DIAGNOSIS — M86071 Acute hematogenous osteomyelitis, right ankle and foot: Secondary | ICD-10-CM | POA: Diagnosis not present

## 2024-06-29 DIAGNOSIS — L02619 Cutaneous abscess of unspecified foot: Secondary | ICD-10-CM | POA: Diagnosis not present

## 2024-06-29 DIAGNOSIS — N185 Chronic kidney disease, stage 5: Secondary | ICD-10-CM | POA: Diagnosis not present

## 2024-06-29 LAB — BASIC METABOLIC PANEL WITH GFR
Anion gap: 14 (ref 5–15)
BUN: 27 mg/dL — ABNORMAL HIGH (ref 6–20)
CO2: 30 mmol/L (ref 22–32)
Calcium: 9.7 mg/dL (ref 8.9–10.3)
Chloride: 93 mmol/L — ABNORMAL LOW (ref 98–111)
Creatinine, Ser: 5.72 mg/dL — ABNORMAL HIGH (ref 0.61–1.24)
GFR, Estimated: 11 mL/min — ABNORMAL LOW
Glucose, Bld: 135 mg/dL — ABNORMAL HIGH (ref 70–99)
Potassium: 3 mmol/L — ABNORMAL LOW (ref 3.5–5.1)
Sodium: 137 mmol/L (ref 135–145)

## 2024-06-29 LAB — CBC
HCT: 32.4 % — ABNORMAL LOW (ref 39.0–52.0)
Hemoglobin: 9.8 g/dL — ABNORMAL LOW (ref 13.0–17.0)
MCH: 28.4 pg (ref 26.0–34.0)
MCHC: 30.2 g/dL (ref 30.0–36.0)
MCV: 93.9 fL (ref 80.0–100.0)
Platelets: 114 K/uL — ABNORMAL LOW (ref 150–400)
RBC: 3.45 MIL/uL — ABNORMAL LOW (ref 4.22–5.81)
RDW: 16.6 % — ABNORMAL HIGH (ref 11.5–15.5)
WBC: 30.9 K/uL — ABNORMAL HIGH (ref 4.0–10.5)
nRBC: 0 % (ref 0.0–0.2)

## 2024-06-29 LAB — C-REACTIVE PROTEIN: CRP: 36.6 mg/dL — ABNORMAL HIGH

## 2024-06-29 LAB — PROTIME-INR
INR: 1.8 — ABNORMAL HIGH (ref 0.8–1.2)
Prothrombin Time: 21.4 s — ABNORMAL HIGH (ref 11.4–15.2)

## 2024-06-29 LAB — SEDIMENTATION RATE: Sed Rate: 91 mm/h — ABNORMAL HIGH (ref 0–16)

## 2024-06-29 MED ORDER — CARVEDILOL 6.25 MG PO TABS
6.2500 mg | ORAL_TABLET | Freq: Two times a day (BID) | ORAL | Status: DC
  Filled 2024-06-29: qty 1

## 2024-06-29 MED ORDER — SODIUM CHLORIDE 0.45 % IV SOLN: INTRAVENOUS | Status: DC

## 2024-06-29 MED ORDER — GABAPENTIN 300 MG PO CAPS: 300.0000 mg | ORAL_CAPSULE | Freq: Two times a day (BID) | ORAL | Status: DC

## 2024-06-29 MED ORDER — POTASSIUM CHLORIDE 20 MEQ PO PACK
60.0000 meq | PACK | Freq: Two times a day (BID) | ORAL | Status: DC
  Filled 2024-06-29 (×2): qty 3

## 2024-06-29 NOTE — Progress Notes (Addendum)
 "  [facility] PROGRESS NOTE   Todd Mccoy FMW:[id number] DOB: 07-31-1970 DOA: 06/28/2024  PCP: Delbert Clam, MD  Brief History: 53 y.o. male with medical history significant of CHF, type 2 diabetes, hyperlipidemia who presented to the emergency department due to nausea and vomiting.  Patient has been nauseous over the last several days.  He is currently undergoing treatment for osteomyelitis of his right foot by podiatry.  Due to feeling poorly he presented to the ER.  Patient underwent CT abdomen which showed no evidence of obstruction however cystitis.  Patient was admitted for further workup.   Consultants: Podiatry  Procedures: ABIs    Subjective/Interval History: Patient somnolent this morning though arousable.  Not interested in talking to me.  States that pain is reasonably well-controlled in his right foot.  Denies any chest pain nausea vomiting.    Assessment/Plan:  Diabetic foot ulcer, right foot/osteomyelitis Symptoms thought to be due to acute infection.  He is followed by podiatry.  Has a previous history of left below-knee amputation.  Has chronic wound in the right foot.  Recent x-ray showed evidence of osteomyelitis.  MRI foot has been ordered and is pending.  ABIs have been ordered.  Will consult podiatry to assist with management. Dr. Janit to see patient and come up with plan. Patient has been started on broad-spectrum antibiotics with vancomycin  and ceftriaxone  which will be continued.  Follow-up on cultures.  Significant leukocytosis noted.  Chronic kidney disease stage V/hypokalemia Patient with significantly elevated creatinine levels.  Not on dialysis.  Avoid nephrotoxic agents.  IV hydration for now.  Will likely need to involve nephrology during this admission.  Monitor urine output. Supplement potassium.  Normocytic anemia Likely anemia of chronic kidney disease.  Mild thrombocytopenia Seems to be chronic.  Continue to  monitor.  Essential hypertension Noted to be on amlodipine .  Blood pressures are soft.  We will hold it for now.  Abnormal UA Unclear if he has a true UTI.  Coronary artery disease Cardiac status appears to be stable.  Chronic systolic CHF Seems to be stable.  Somewhat hypovolemic. Based on echocardiogram from January 2025 his LVEF is 45 to 50%.  Grade 2 diastolic dysfunction was noted.  Moderate pericardial effusion was noted at that time.  No significant valvular abnormalities.  DVT Prophylaxis: Lovenox  Code Status: Full code Family Communication: Discussed with patient Disposition Plan: To be determined  Status is: Inpatient Remains inpatient appropriate because: IV antibiotics, possible surgical need      Medications: Scheduled:  amLODipine   10 mg Oral Daily   atorvastatin   20 mg Oral Daily   calcitRIOL   0.25 mcg Oral BID   carvedilol   25 mg Oral BID WC   enoxaparin  (LOVENOX ) injection  30 mg Subcutaneous Q24H   gabapentin   600 mg Oral BID   hydrALAZINE   50 mg Oral TID   isosorbide  mononitrate  60 mg Oral Daily   potassium chloride   60 mEq Oral BID   torsemide   80 mg Oral Daily   vancomycin  variable dose per unstable renal function (pharmacist dosing)   Does not apply See admin instructions   Continuous:  sodium chloride      cefTRIAXone  (ROCEPHIN )  IV     PRN:acetaminophen  **OR** acetaminophen , baclofen , hydrOXYzine , ondansetron  **OR** ondansetron  (ZOFRAN ) IV, oxyCODONE   Antibiotics: Anti-infectives (From admission, onward)    Start     Dose/Rate Route Frequency Ordered Stop   06/29/24 1000  cefTRIAXone  (ROCEPHIN ) 2 g in sodium chloride  0.9 % 100 mL IVPB  2 g 200 mL/hr over 30 Minutes Intravenous Every 24 hours 06/28/24 2301     06/28/24 2133  vancomycin  variable dose per unstable renal function (pharmacist dosing)         Does not apply See admin instructions 06/28/24 2133     06/28/24 2130  vancomycin  (VANCOREADY) IVPB 1750 mg/350 mL        1,750  mg 175 mL/hr over 120 Minutes Intravenous  Once 06/28/24 2128 06/29/24 0013   06/28/24 1915  cefTRIAXone  (ROCEPHIN ) 2 g in sodium chloride  0.9 % 100 mL IVPB        2 g 200 mL/hr over 30 Minutes Intravenous  Once 06/28/24 1906 06/28/24 2005       Objective:  Vital Signs  Vitals:   06/29/24 0346 06/29/24 0400 06/29/24 0430 06/29/24 0640  BP:  103/70 103/67 110/64  Pulse: 70 69 69 75  Resp:  14 16 12   Temp: (!) 97.5 F (36.4 C)     TempSrc: Oral     SpO2: 95% 90% 95% 95%  Height:        Intake/Output Summary (Last 24 hours) at 06/29/2024 1009 Last data filed at 06/29/2024 0013 Gross per 24 hour  Intake 341.86 ml  Output --  Net 341.86 ml    General appearance: Awake alert.  In no distress Resp: Clear to auscultation bilaterally.  Normal effort Cardio: S1-S2 is normal regular.  No S3-S4.  No rubs murmurs or bruit GI: Abdomen is soft.  Nontender nondistended.  Bowel sounds are present normal.  No masses organomegaly Extremities: Wounds noted over the right foot.  Foul-smelling.  Swelling is noted over the right foot.  Faintly palpable pulses noted. Neurologic: No focal neurological deficits.    Lab Results:  Data Reviewed: I have personally reviewed following labs and reports of the imaging studies  CBC: Recent Labs  Lab 06/28/24 1833 06/29/24 0500  WBC 30.2* 30.9*  NEUTROABS 27.4*  --   HGB 9.4* 9.8*  HCT 31.1* 32.4*  MCV 93.7 93.9  PLT 112* 114*    Basic Metabolic Panel: Recent Labs  Lab 06/28/24 1833 06/29/24 0500  NA 137 137  K 3.1* 3.0*  CL 97* 93*  CO2 27 30  GLUCOSE 108* 135*  BUN 22* 27*  CREATININE 4.71* 5.72*  CALCIUM  8.2* 9.7    GFR: Estimated Creatinine Clearance: 15.8 mL/min (A) (by C-G formula based on SCr of 5.72 mg/dL (H)).  Liver Function Tests: Recent Labs  Lab 06/28/24 1833  AST 24  ALT <5  ALKPHOS 165*  BILITOT 1.7*  PROT 6.4*  ALBUMIN  2.4*    Recent Labs  Lab 06/28/24 2036  AMMONIA 21    Coagulation  Profile: Recent Labs  Lab 06/29/24 0500  INR 1.8*    Recent Results (from the past 240 hours)  Resp panel by RT-PCR (RSV, Flu A&B, Covid) Anterior Nasal Swab     Status: None   Collection Time: 06/28/24  6:33 PM   Specimen: Anterior Nasal Swab  Result Value Ref Range Status   SARS Coronavirus 2 by RT PCR NEGATIVE NEGATIVE Final    Comment: (NOTE) SARS-CoV-2 target nucleic acids are NOT DETECTED.  The SARS-CoV-2 RNA is generally detectable in upper respiratory specimens during the acute phase of infection. The lowest concentration of SARS-CoV-2 viral copies this assay can detect is 138 copies/mL. A negative result does not preclude SARS-Cov-2 infection and should not be used as the sole basis for treatment or other patient management decisions. A negative  result may occur with  improper specimen collection/handling, submission of specimen other than nasopharyngeal swab, presence of viral mutation(s) within the areas targeted by this assay, and inadequate number of viral copies(<138 copies/mL). A negative result must be combined with clinical observations, patient history, and epidemiological information. The expected result is Negative.  Fact Sheet for Patients:  bloggercourse.com  Fact Sheet for Healthcare Providers:  seriousbroker.it  This test is no t yet approved or cleared by the [place]  FDA and  has been authorized for detection and/or diagnosis of SARS-CoV-2 by FDA under an Emergency Use Authorization (EUA). This EUA will remain  in effect (meaning this test can be used) for the duration of the COVID-19 declaration under Section 564(b)(1) of the Act, 21 U.S.C.section 360bbb-3(b)(1), unless the authorization is terminated  or revoked sooner.       Influenza A by PCR NEGATIVE NEGATIVE Final   Influenza B by PCR NEGATIVE NEGATIVE Final    Comment: (NOTE) The Xpert Xpress SARS-CoV-2/FLU/RSV plus assay is intended  as an aid in the diagnosis of influenza from Nasopharyngeal swab specimens and should not be used as a sole basis for treatment. Nasal washings and aspirates are unacceptable for Xpert Xpress SARS-CoV-2/FLU/RSV testing.  Fact Sheet for Patients: bloggercourse.com  Fact Sheet for Healthcare Providers: seriousbroker.it  This test is not yet approved or cleared by the [place]  FDA and has been authorized for detection and/or diagnosis of SARS-CoV-2 by FDA under an Emergency Use Authorization (EUA). This EUA will remain in effect (meaning this test can be used) for the duration of the COVID-19 declaration under Section 564(b)(1) of the Act, 21 U.S.C. section 360bbb-3(b)(1), unless the authorization is terminated or revoked.     Resp Syncytial Virus by PCR NEGATIVE NEGATIVE Final    Comment: (NOTE) Fact Sheet for Patients: bloggercourse.com  Fact Sheet for Healthcare Providers: seriousbroker.it  This test is not yet approved or cleared by the [place]  FDA and has been authorized for detection and/or diagnosis of SARS-CoV-2 by FDA under an Emergency Use Authorization (EUA). This EUA will remain in effect (meaning this test can be used) for the duration of the COVID-19 declaration under Section 564(b)(1) of the Act, 21 U.S.C. section 360bbb-3(b)(1), unless the authorization is terminated or revoked.  Performed at [facility], [address], [place], [name] [address]       Radiology Studies: CT ABDOMEN PELVIS WO CONTRAST Result Date: 06/28/2024 EXAM: CT ABDOMEN AND PELVIS WITHOUT CONTRAST 06/28/2024 06:28:43 PM TECHNIQUE: CT of the abdomen and pelvis was performed without the administration of intravenous contrast. Multiplanar reformatted images are provided for review. Automated exposure control, iterative reconstruction, and/or weight-based  adjustment of the mA/kV was utilized to reduce the radiation dose to as low as reasonably achievable. COMPARISON: 02/04/2023 CLINICAL HISTORY: Bowel obstruction suspected. Nausea, vomiting, diarrhea, abdominal pain FINDINGS: LOWER CHEST: Small - moderate right pleural effusion. Cardiomegaly. Interlobular septal thickening and ground glass opacities in the lower lobes suggestive of edema. LIVER: Marked hepatomegaly. Hepatic steatosis. GALLBLADDER AND BILE DUCTS: Sludge in the gallbladder. No evidence of acute cholecystitis. No biliary ductal dilatation. SPLEEN: The spleen is mildly enlarged measuring 14.5 cm in craniocaudal dimension. PANCREAS: No acute abnormality. ADRENAL GLANDS: No acute abnormality. KIDNEYS, URETERS AND BLADDER: No stones in the kidneys or ureters. No hydronephrosis. No perinephric or periureteral stranding. Diffuse bladder wall thickening of the nondistended bladder. Trace adjacent perivesical stranding. GI AND BOWEL: Stomach demonstrates no acute abnormality. There is no bowel obstruction. PERITONEUM AND RETROPERITONEUM: Small  volume or abdominal pelvic ascites. No free air. VASCULATURE: Aorta is normal in caliber. Aortic atherosclerotic calcification. LYMPH NODES: 1.2 cm right iliac chain lymph node on series 2 image 75 has increased from 02/04/2023 when it measured 1.0 cm and 1.5 cm right iliac chain lymph node on series 2 image 60 was not well visualized previously due to ascites. Additional shotty subcentimeter retroperitoneal and iliac chain lymph nodes are similar. Marked enlargement of a right inguinal lymph node on series 2 image 98 measuring 2.4 cm. This is similar to 02/04/2023. Given relative stability since 02/04/2023 these are favored reactive. REPRODUCTIVE ORGANS: No acute abnormality. BONES AND SOFT TISSUES: No acute osseous abnormality. Body wall edema. No focal soft tissue abnormality. IMPRESSION: 1. No evidence of bowel obstruction. 2. Bladder wall thickening and trace  perivesical stranding. Correlate for cystitis. 3. Hepatosplenomegaly. Small volume abdominal and pelvic ascites. 4. Small - moderate effusion with pulmonary edema in the lower lungs. Cardiomegaly. Electronically signed by: Norman Gatlin MD 06/28/2024 06:46 PM EST RP Workstation: [id number]       LOS: 1 day   Joette Pebbles  [facility] Pager on [email or web address]  06/29/2024, 10:09 AM   "

## 2024-06-29 NOTE — Plan of Care (Signed)
  Problem: Coping: Goal: Level of anxiety will decrease Outcome: Progressing   Problem: Pain Managment: Goal: General experience of comfort will improve and/or be controlled Outcome: Progressing   Problem: Safety: Goal: Ability to remain free from injury will improve Outcome: Progressing   Problem: Skin Integrity: Goal: Risk for impaired skin integrity will decrease Outcome: Progressing

## 2024-06-30 ENCOUNTER — Encounter (HOSPITAL_COMMUNITY): Admission: EM | Disposition: A | Payer: Self-pay | Source: Home / Self Care

## 2024-06-30 ENCOUNTER — Inpatient Hospital Stay (HOSPITAL_COMMUNITY)

## 2024-06-30 ENCOUNTER — Encounter (HOSPITAL_COMMUNITY): Payer: Self-pay | Admitting: Internal Medicine

## 2024-06-30 ENCOUNTER — Inpatient Hospital Stay (HOSPITAL_COMMUNITY): Admitting: Anesthesiology

## 2024-06-30 DIAGNOSIS — M726 Necrotizing fasciitis: Secondary | ICD-10-CM | POA: Diagnosis not present

## 2024-06-30 DIAGNOSIS — F17210 Nicotine dependence, cigarettes, uncomplicated: Secondary | ICD-10-CM | POA: Diagnosis not present

## 2024-06-30 DIAGNOSIS — I5042 Chronic combined systolic (congestive) and diastolic (congestive) heart failure: Secondary | ICD-10-CM | POA: Diagnosis not present

## 2024-06-30 DIAGNOSIS — L02619 Cutaneous abscess of unspecified foot: Secondary | ICD-10-CM | POA: Diagnosis not present

## 2024-06-30 DIAGNOSIS — I110 Hypertensive heart disease with heart failure: Secondary | ICD-10-CM | POA: Diagnosis not present

## 2024-06-30 DIAGNOSIS — M86071 Acute hematogenous osteomyelitis, right ankle and foot: Secondary | ICD-10-CM | POA: Diagnosis not present

## 2024-06-30 DIAGNOSIS — A480 Gas gangrene: Secondary | ICD-10-CM | POA: Diagnosis not present

## 2024-06-30 DIAGNOSIS — N3001 Acute cystitis with hematuria: Secondary | ICD-10-CM | POA: Diagnosis not present

## 2024-06-30 DIAGNOSIS — E1152 Type 2 diabetes mellitus with diabetic peripheral angiopathy with gangrene: Secondary | ICD-10-CM

## 2024-06-30 DIAGNOSIS — M7989 Other specified soft tissue disorders: Secondary | ICD-10-CM

## 2024-06-30 HISTORY — PX: TRANSMETATARSAL AMPUTATION: SHX6197

## 2024-06-30 LAB — HEPATITIS B SURFACE ANTIGEN: Hepatitis B Surface Ag: NONREACTIVE

## 2024-06-30 LAB — BASIC METABOLIC PANEL WITH GFR
Anion gap: 15 (ref 5–15)
Anion gap: 2 — ABNORMAL LOW (ref 5–15)
BUN: 33 mg/dL — ABNORMAL HIGH (ref 6–20)
BUN: 31 mg/dL — ABNORMAL HIGH (ref 6–20)
CO2: 24 mmol/L (ref 22–32)
CO2: 22 mmol/L (ref 22–32)
Calcium: 7.9 mg/dL — ABNORMAL LOW (ref 8.9–10.3)
Calcium: 7.5 mg/dL — ABNORMAL LOW (ref 8.9–10.3)
Chloride: 91 mmol/L — ABNORMAL LOW (ref 98–111)
Chloride: 96 mmol/L — ABNORMAL LOW (ref 98–111)
Creatinine, Ser: 5.34 mg/dL — ABNORMAL HIGH (ref 0.61–1.24)
Creatinine, Ser: 5.43 mg/dL — ABNORMAL HIGH (ref 0.61–1.24)
GFR, Estimated: 12 mL/min — ABNORMAL LOW
GFR, Estimated: 12 mL/min — ABNORMAL LOW
Glucose, Bld: 98 mg/dL (ref 70–99)
Glucose, Bld: 83 mg/dL (ref 70–99)
Potassium: 2.6 mmol/L — CL (ref 3.5–5.1)
Potassium: >7.5 mmol/L (ref 3.5–5.1)
Sodium: 129 mmol/L — ABNORMAL LOW (ref 135–145)
Sodium: 120 mmol/L — ABNORMAL LOW (ref 135–145)

## 2024-06-30 LAB — CBC
HCT: 26.1 % — ABNORMAL LOW (ref 39.0–52.0)
Hemoglobin: 8 g/dL — ABNORMAL LOW (ref 13.0–17.0)
MCH: 28.7 pg (ref 26.0–34.0)
MCHC: 30.7 g/dL (ref 30.0–36.0)
MCV: 93.5 fL (ref 80.0–100.0)
Platelets: 92 K/uL — ABNORMAL LOW (ref 150–400)
RBC: 2.79 MIL/uL — ABNORMAL LOW (ref 4.22–5.81)
RDW: 16.7 % — ABNORMAL HIGH (ref 11.5–15.5)
WBC: 21.5 K/uL — ABNORMAL HIGH (ref 4.0–10.5)
nRBC: 0 % (ref 0.0–0.2)

## 2024-06-30 LAB — GLUCOSE, CAPILLARY
Glucose-Capillary: 97 mg/dL (ref 70–99)
Glucose-Capillary: 101 mg/dL — ABNORMAL HIGH (ref 70–99)

## 2024-06-30 LAB — POTASSIUM: Potassium: 4.5 mmol/L (ref 3.5–5.1)

## 2024-06-30 LAB — URINE CULTURE: Culture: >=100000 — AB

## 2024-06-30 LAB — VANCOMYCIN, RANDOM: Vancomycin Rm: 16 ug/mL

## 2024-06-30 LAB — HIV ANTIBODY (ROUTINE TESTING W REFLEX): HIV Screen 4th Generation wRfx: NONREACTIVE

## 2024-06-30 LAB — PHOSPHORUS: Phosphorus: 2.3 mg/dL — ABNORMAL LOW (ref 2.5–4.6)

## 2024-06-30 LAB — MAGNESIUM: Magnesium: 1.4 mg/dL — ABNORMAL LOW (ref 1.7–2.4)

## 2024-06-30 LAB — SURGICAL PCR SCREEN
MRSA, PCR: NEGATIVE
Staphylococcus aureus: POSITIVE — AB

## 2024-06-30 SURGERY — AMPUTATION, FOOT, TRANSMETATARSAL
Anesthesia: Monitor Anesthesia Care | Site: Foot | Laterality: Right

## 2024-06-30 MED ORDER — BUPIVACAINE HCL (PF) 0.5 % IJ SOLN
INTRAMUSCULAR | Status: AC
  Filled 2024-06-30: qty 10

## 2024-06-30 MED ORDER — HYDROMORPHONE HCL 1 MG/ML IJ SOLN
0.2500 mg | INTRAMUSCULAR | Status: DC | PRN
  Administered 2024-06-30: 0.5 mg via INTRAVENOUS

## 2024-06-30 MED ORDER — ANTICOAGULANT SODIUM CITRATE 4% (200MG/5ML) IV SOLN: 5.0000 mL | Status: DC | PRN

## 2024-06-30 MED ORDER — HEPARIN SODIUM (PORCINE) 1000 UNIT/ML DIALYSIS: 1000.0000 [IU] | INTRAMUSCULAR | Status: DC | PRN

## 2024-06-30 MED ORDER — PROPOFOL 500 MG/50ML IV EMUL
INTRAVENOUS | Status: DC | PRN
  Administered 2024-06-30: 30 ug/kg/min via INTRAVENOUS

## 2024-06-30 MED ORDER — SODIUM CHLORIDE 0.9 % IV SOLN: INTRAVENOUS | Status: DC

## 2024-06-30 MED ORDER — 0.9 % SODIUM CHLORIDE (POUR BTL) OPTIME
TOPICAL | Status: DC | PRN
  Administered 2024-06-30: 1000 mL

## 2024-06-30 MED ORDER — LACTATED RINGERS IV SOLN: INTRAVENOUS | Status: DC

## 2024-06-30 MED ORDER — CHLORHEXIDINE GLUCONATE CLOTH 2 % EX PADS
6.0000 | MEDICATED_PAD | Freq: Every day | CUTANEOUS | Status: DC
  Administered 2024-06-30 – 2024-07-04 (×5): 6 via TOPICAL

## 2024-06-30 MED ORDER — HYDROMORPHONE HCL 1 MG/ML IJ SOLN
INTRAMUSCULAR | Status: AC
  Filled 2024-06-30: qty 1

## 2024-06-30 MED ORDER — CHLORHEXIDINE GLUCONATE 0.12 % MT SOLN: 15.0000 mL | Freq: Once | OROMUCOSAL | Status: DC

## 2024-06-30 MED ORDER — ORAL CARE MOUTH RINSE: 15.0000 mL | OROMUCOSAL | Status: DC | PRN

## 2024-06-30 MED ORDER — LIDOCAINE HCL 2 % IJ SOLN
INTRAMUSCULAR | Status: DC | PRN
  Administered 2024-06-30: 20 mL

## 2024-06-30 MED ORDER — FENTANYL CITRATE (PF) 100 MCG/2ML IJ SOLN
INTRAMUSCULAR | Status: DC | PRN
  Administered 2024-06-30: 25 ug via INTRAVENOUS

## 2024-06-30 MED ORDER — ORAL CARE MOUTH RINSE: 15.0000 mL | Freq: Once | OROMUCOSAL | Status: DC

## 2024-06-30 MED ORDER — LIDOCAINE HCL 2 % IJ SOLN
INTRAMUSCULAR | Status: AC
  Filled 2024-06-30: qty 20

## 2024-06-30 MED ORDER — POTASSIUM CHLORIDE 10 MEQ/100ML IV SOLN
10.0000 meq | INTRAVENOUS | Status: DC
  Administered 2024-06-30 (×6): 10 meq via INTRAVENOUS
  Filled 2024-06-30 (×6): qty 100

## 2024-06-30 MED ORDER — FENTANYL CITRATE (PF) 100 MCG/2ML IJ SOLN
INTRAMUSCULAR | Status: AC
  Filled 2024-06-30: qty 2

## 2024-06-30 MED ORDER — LIDOCAINE HCL (PF) 1 % IJ SOLN: 5.0000 mL | INTRAMUSCULAR | Status: DC | PRN

## 2024-06-30 MED ORDER — LIDOCAINE-PRILOCAINE 2.5-2.5 % EX CREA: 1.0000 | TOPICAL_CREAM | CUTANEOUS | Status: DC | PRN

## 2024-06-30 MED ORDER — LIDOCAINE HCL (PF) 2 % IJ SOLN
INTRAMUSCULAR | Status: AC
  Filled 2024-06-30: qty 10

## 2024-06-30 MED ORDER — LIDOCAINE HCL 2 % IJ SOLN
INTRAMUSCULAR | Status: DC | PRN
  Administered 2024-06-30: 10 mL

## 2024-06-30 MED ORDER — SODIUM CHLORIDE 0.9 % IV SOLN: INTRAVENOUS | Status: DC | PRN

## 2024-06-30 MED ORDER — TRANEXAMIC ACID-NACL 1000-0.7 MG/100ML-% IV SOLN
1000.0000 mg | INTRAVENOUS | Status: AC
  Administered 2024-07-01: 1000 mg via INTRAVENOUS

## 2024-06-30 MED ORDER — ALTEPLASE 2 MG IJ SOLR: 2.0000 mg | Freq: Once | INTRAMUSCULAR | Status: DC | PRN

## 2024-06-30 MED ORDER — PENTAFLUOROPROP-TETRAFLUOROETH EX AERO: 1.0000 | INHALATION_SPRAY | CUTANEOUS | Status: DC | PRN

## 2024-06-30 MED ORDER — CHLORHEXIDINE GLUCONATE 0.12 % MT SOLN
OROMUCOSAL | Status: AC
  Filled 2024-06-30: qty 15

## 2024-06-30 MED ORDER — POTASSIUM CHLORIDE CRYS ER 20 MEQ PO TBCR
60.0000 meq | EXTENDED_RELEASE_TABLET | Freq: Once | ORAL | Status: AC
  Administered 2024-06-30: 60 meq via ORAL
  Filled 2024-06-30: qty 3

## 2024-06-30 SURGICAL SUPPLY — 39 items
BAG COUNTER SPONGE SURGICOUNT (BAG) ×1 IMPLANT
BENZOIN TINCTURE PRP APPL 2/3 (GAUZE/BANDAGES/DRESSINGS) ×1 IMPLANT
BLADE LONG MED 31X9 (MISCELLANEOUS) IMPLANT
BLADE OSCILLATING /SAGITTAL (BLADE) ×1 IMPLANT
BLADE SURG 15 STRL LF DISP TIS (BLADE) IMPLANT
BNDG ELASTIC 4INX 5YD STR LF (GAUZE/BANDAGES/DRESSINGS) IMPLANT
BNDG ELASTIC 4X5.8 VLCR STR LF (GAUZE/BANDAGES/DRESSINGS) IMPLANT
BNDG GAUZE DERMACEA FLUFF 4 (GAUZE/BANDAGES/DRESSINGS) ×1 IMPLANT
CHLORAPREP W/TINT 26 (MISCELLANEOUS) IMPLANT
COVER SURGICAL LIGHT HANDLE (MISCELLANEOUS) ×1 IMPLANT
CUFF TOURN SGL QUICK 18X4 (TOURNIQUET CUFF) ×1 IMPLANT
CUFF TRNQT CYL 24X4X16.5-23 (TOURNIQUET CUFF) IMPLANT
ELECTRODE REM PT RTRN 9FT ADLT (ELECTROSURGICAL) IMPLANT
GAUZE PACKING IODOFORM 1/4X15 (PACKING) ×1 IMPLANT
GAUZE PAD ABD 8X10 STRL (GAUZE/BANDAGES/DRESSINGS) ×1 IMPLANT
GAUZE SPONGE 4X4 12PLY STRL (GAUZE/BANDAGES/DRESSINGS) ×1 IMPLANT
GAUZE XEROFORM 1X8 LF (GAUZE/BANDAGES/DRESSINGS) ×1 IMPLANT
GLOVE BIO SURGEON STRL SZ8 (GLOVE) ×1 IMPLANT
GLOVE BIOGEL PI IND STRL 8 (GLOVE) ×1 IMPLANT
GOWN STRL REUS W/ TWL LRG LVL3 (GOWN DISPOSABLE) ×2 IMPLANT
KIT BASIN OR (CUSTOM PROCEDURE TRAY) ×1 IMPLANT
KIT TURNOVER KIT B (KITS) ×1 IMPLANT
NDL PRECISIONGLIDE 27X1.5 (NEEDLE) ×1 IMPLANT
NEEDLE PRECISIONGLIDE 27X1.5 (NEEDLE) ×1 IMPLANT
PACK ORTHO EXTREMITY (CUSTOM PROCEDURE TRAY) ×1 IMPLANT
PAD ABD 8X10 STRL (GAUZE/BANDAGES/DRESSINGS) IMPLANT
PAD ARMBOARD POSITIONER FOAM (MISCELLANEOUS) ×2 IMPLANT
PAD CAST 4YDX4 CTTN HI CHSV (CAST SUPPLIES) IMPLANT
SOL PREP POV-IOD 4OZ 10% (MISCELLANEOUS) ×1 IMPLANT
SOLN 0.9% NACL POUR BTL 1000ML (IV SOLUTION) ×1 IMPLANT
STAPLER SKIN PROX 35W (STAPLE) IMPLANT
STRIP CLOSURE SKIN 1/2X4 (GAUZE/BANDAGES/DRESSINGS) ×1 IMPLANT
SUT PROLENE 4 0 PS 2 18 (SUTURE) IMPLANT
SUT VIC AB 3-0 PS2 18XBRD (SUTURE) IMPLANT
SUT VIC AB 4-0 PS2 18 (SUTURE) ×1 IMPLANT
SYR CONTROL 10ML LL (SYRINGE) ×2 IMPLANT
TOWEL GREEN STERILE (TOWEL DISPOSABLE) ×1 IMPLANT
TUBE CONNECTING 12X1/4 (SUCTIONS) ×1 IMPLANT
YANKAUER SUCT BULB TIP NO VENT (SUCTIONS) IMPLANT

## 2024-06-30 NOTE — Consult Note (Signed)
 [facility] Nephrology Consult Note: Reason for Consult: To manage dialysis and dialysis related needs Referring Physician: Dr. Verdene, Joette  HPI:  Todd Mccoy is an 53 y.o. male with past medical history significant for hypertension, hyperlipidemia, diabetes, CHF, was admitted for right foot diabetic ulcer, osteomyelitis seen as a consultation for the management of ESRD.  The patient was seen by podiatrist with previous history of left below-knee amputation.  The MRI raises concern of osteomyelitis, abscess and gas.  The plan is to do surgical intervention. The patient is lying on bed comfortable.  In room air.  BP acceptable.  The labs showed sodium of 129, potassium 2.6, leukocytosis and anemia.  The patient received oral potassium chloride  repletion for hypokalemia.  On ceftriaxone  and vancomycin  for foot infection. He is currently somnolent.  Denies nausea, vomiting, chest pain, shortness of breath.  He has a history of missing outpatient dialysis often. OP HD orders:  Dialyzes at  [facility], TTS, 4 hr, BFR 450, 2k 2 ca, EDW 85.6 kg, last OP HD on 12/24.RUE AVF.  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/2020   pt states recent MD visit /left eye worse than right   Dyslipidemia 12/10/2020   Hx of BKA, left (HCC)    Hypertension    Morbid obesity (HCC)    Osteomyelitis (HCC)    Pneumonia    Pulmonary HTN (HCC)    mild to moderate (PASP58mmHg) by echo 07/2023     Past Surgical History:  Procedure Laterality Date   ABSCESS DRAINAGE     neck   AMPUTATION Left 11/14/2020   Procedure: LEFT 5TH RAY AMPUTATION;  Surgeon: Harden Jerona GAILS, MD;  Location: [facility] OR;  Service: Orthopedics;  Laterality: Left;   AMPUTATION Left 12/10/2020   Procedure: LEFT BELOW KNEE AMPUTATION;  Surgeon: Harden Jerona GAILS, MD;  Location: [facility] OR;  Service: Orthopedics;  Laterality: Left;   AMPUTATION TOE Right 03/25/2020   Procedure: AMPUTATION TOE;  Surgeon: Gretel Ozell PARAS, DPM;  Location: [facility] ORS;  Service: Podiatry;  Laterality: Right;   AV FISTULA PLACEMENT Right 05/23/2023   Procedure: RIGHT ARM Brachiocephalic ARTERIOVENOUS (AV) FISTULA CREATION;  Surgeon: Lanis Fonda BRAVO, MD;  Location: [facility] OR;  Service: Vascular;  Laterality: Right;   INCISION AND DRAINAGE ABSCESS Right 09/07/2014   Procedure: INCISION AND DRAINAGE ABSCESS RIGHT FLANK;  Surgeon: Krystal Russell, MD;  Location: [facility] ORS;  Service: General;  Laterality: Right;   IR FLUORO GUIDE CV LINE RIGHT  02/11/2023   IR PARACENTESIS  10/03/2023   IR PARACENTESIS  10/19/2023   IR PARACENTESIS  11/07/2023   IR PARACENTESIS  11/30/2023   IR PARACENTESIS  12/23/2023   IR PARACENTESIS  02/03/2024   IR PARACENTESIS  04/16/2024   IR PARACENTESIS  05/14/2024   IR REMOVAL TUN CV CATH W/O FL  11/30/2023   IR US  GUIDE VASC ACCESS RIGHT  02/11/2023   LEG  AMPUTATION BELOW KNEE Left 12/10/2020   SCROTUM EXPLORATION      Family History  Problem Relation Age of Onset   Diabetes Mother    Diabetes Father    Diabetes Brother    Colon cancer Neg Hx    Esophageal cancer Neg Hx    Pancreatic cancer Neg Hx    Stomach cancer Neg Hx    Liver disease Neg Hx    CAD Neg Hx     Social History:  reports that he has been smoking cigarettes. He started smoking about 34 years ago. He has a 16 pack-year smoking history. He has been exposed to tobacco smoke. He has quit using smokeless tobacco. He reports that he does not drink alcohol  and does not use  drugs.  Allergies: Allergies[1]  Medications: I have reviewed the patient's current medications.   Results for orders placed or performed during the hospital encounter of 06/28/24 (from the past 48 hours)  Comprehensive metabolic panel     Status: Abnormal   Collection Time: 06/28/24  6:33 PM  Result Value Ref Range   Sodium 137 135 - 145 mmol/L   Potassium 3.1 (L) 3.5 - 5.1 mmol/L    Comment: HEMOLYSIS AT THIS LEVEL MAY AFFECT RESULT   Chloride 97 (L) 98 - 111 mmol/L   CO2 27 22 - 32 mmol/L   Glucose, Bld 108 (H) 70 - 99 mg/dL    Comment: Glucose reference range applies only to samples taken after fasting for at least 8 hours.   BUN 22 (H) 6 - 20 mg/dL   Creatinine, Ser 5.28 (H) 0.61 - 1.24 mg/dL   Calcium  8.2 (L) 8.9 - 10.3 mg/dL   Total Protein 6.4 (L) 6.5 - 8.1 g/dL   Albumin  2.4 (L) 3.5 - 5.0 g/dL   AST 24 15 - 41 U/L    Comment: HEMOLYSIS AT THIS LEVEL MAY AFFECT RESULT   ALT <5 0 - 44 U/L   Alkaline Phosphatase 165 (H) 38 - 126 U/L   Total Bilirubin 1.7 (H) 0.0 - 1.2 mg/dL   GFR, Estimated 14 (L) >60 mL/min    Comment: (NOTE) Calculated using the CKD-EPI Creatinine Equation (2021)    Anion gap 13 5 - 15    Comment: Performed at [facility], [address], [place], [name] [address]  CBC with Differential     Status: Abnormal   Collection Time: 06/28/24  6:33 PM  Result Value Ref Range   WBC 30.2 (H) 4.0 - 10.5 K/uL   RBC 3.32 (L) 4.22 - 5.81 MIL/uL   Hemoglobin 9.4 (L) 13.0 - 17.0 g/dL   HCT 68.8 (L) 60.9 - 47.9 %   MCV 93.7 80.0 - 100.0 fL   MCH 28.3 26.0 - 34.0 pg   MCHC 30.2 30.0 - 36.0 g/dL   RDW 83.4 (H) 88.4 - 84.4 %   Platelets 112 (L) 150 - 400 K/uL   nRBC 0.0 0.0 - 0.2 %   Neutrophils Relative % 91 %   Neutro Abs 27.4 (H) 1.7 - 7.7 K/uL   Lymphocytes Relative 3 %   Lymphs Abs 0.9 0.7 - 4.0 K/uL   Monocytes Relative 4 %   Monocytes Absolute 1.1 (H) 0.1 - 1.0 K/uL   Eosinophils Relative 0 %   Eosinophils Absolute 0.1 0.0 - 0.5  K/uL   Basophils Relative 0 %   Basophils Absolute 0.1 0.0 - 0.1 K/uL   Immature Granulocytes 2 %   Abs Immature Granulocytes 0.69 (  H) 0.00 - 0.07 K/uL    Comment: Performed at [facility], [address], [place], [name] [address]  Resp panel by RT-PCR (RSV, Flu A&B, Covid) Anterior Nasal Swab     Status: None   Collection Time: 06/28/24  6:33 PM   Specimen: Anterior Nasal Swab  Result Value Ref Range   SARS Coronavirus 2 by RT PCR NEGATIVE NEGATIVE    Comment: (NOTE) SARS-CoV-2 target nucleic acids are NOT DETECTED.  The SARS-CoV-2 RNA is generally detectable in upper respiratory specimens during the acute phase of infection. The lowest concentration of SARS-CoV-2 viral copies this assay can detect is 138 copies/mL. A negative result does not preclude SARS-Cov-2 infection and should not be used as the sole basis for treatment or other patient management decisions. A negative result may occur with  improper specimen collection/handling, submission of specimen other than nasopharyngeal swab, presence of viral mutation(s) within the areas targeted by this assay, and inadequate number of viral copies(<138 copies/mL). A negative result must be combined with clinical observations, patient history, and epidemiological information. The expected result is Negative.  Fact Sheet for Patients:  bloggercourse.com  Fact Sheet for Healthcare Providers:  seriousbroker.it  This test is no t yet approved or cleared by the [place]  FDA and  has been authorized for detection and/or diagnosis of SARS-CoV-2 by FDA under an Emergency Use Authorization (EUA). This EUA will remain  in effect (meaning this test can be used) for the duration of the COVID-19 declaration under Section 564(b)(1) of the Act, 21 U.S.C.section 360bbb-3(b)(1), unless the authorization is terminated  or revoked sooner.       Influenza A by PCR  NEGATIVE NEGATIVE   Influenza B by PCR NEGATIVE NEGATIVE    Comment: (NOTE) The Xpert Xpress SARS-CoV-2/FLU/RSV plus assay is intended as an aid in the diagnosis of influenza from Nasopharyngeal swab specimens and should not be used as a sole basis for treatment. Nasal washings and aspirates are unacceptable for Xpert Xpress SARS-CoV-2/FLU/RSV testing.  Fact Sheet for Patients: bloggercourse.com  Fact Sheet for Healthcare Providers: seriousbroker.it  This test is not yet approved or cleared by the [place]  FDA and has been authorized for detection and/or diagnosis of SARS-CoV-2 by FDA under an Emergency Use Authorization (EUA). This EUA will remain in effect (meaning this test can be used) for the duration of the COVID-19 declaration under Section 564(b)(1) of the Act, 21 U.S.C. section 360bbb-3(b)(1), unless the authorization is terminated or revoked.     Resp Syncytial Virus by PCR NEGATIVE NEGATIVE    Comment: (NOTE) Fact Sheet for Patients: bloggercourse.com  Fact Sheet for Healthcare Providers: seriousbroker.it  This test is not yet approved or cleared by the [place]  FDA and has been authorized for detection and/or diagnosis of SARS-CoV-2 by FDA under an Emergency Use Authorization (EUA). This EUA will remain in effect (meaning this test can be used) for the duration of the COVID-19 declaration under Section 564(b)(1) of the Act, 21 U.S.C. section 360bbb-3(b)(1), unless the authorization is terminated or revoked.  Performed at [facility], [address] [place], [name] [address]   Ethanol     Status: None   Collection Time: 06/28/24  7:44 PM  Result Value Ref Range   Alcohol , Ethyl (B) <15 <15 mg/dL    Comment: (NOTE) For medical purposes only. Performed at [facility], [address], [place], [name]  [address]   Ammonia     Status: None  Collection Time: 06/28/24  8:36 PM  Result Value Ref Range   Ammonia 21 9 - 35 umol/L    Comment: Performed at [facility], [address] [place], [name] [address]  Urinalysis, w/ Reflex to Culture (Infection Suspected) -Urine, Catheterized     Status: Abnormal   Collection Time: 06/28/24  9:33 PM  Result Value Ref Range   Specimen Source URINE, CLEAN CATCH    Color, Urine YELLOW YELLOW   APPearance TURBID (A) CLEAR   Specific Gravity, Urine 1.016 1.005 - 1.030   pH 7.0 5.0 - 8.0   Glucose, UA NEGATIVE NEGATIVE mg/dL   Hgb urine dipstick SMALL (A) NEGATIVE   Bilirubin Urine NEGATIVE NEGATIVE   Ketones, ur NEGATIVE NEGATIVE mg/dL   Protein, ur 899 (A) NEGATIVE mg/dL   Nitrite NEGATIVE NEGATIVE   Leukocytes,Ua SMALL (A) NEGATIVE   RBC / HPF 11-20 0 - 5 RBC/hpf   WBC, UA >50 0 - 5 WBC/hpf    Comment:        Reflex urine culture not performed if WBC <=10, OR if Squamous epithelial cells >5. If Squamous epithelial cells >5 suggest recollection.    Bacteria, UA MANY (A) NONE SEEN   Squamous Epithelial / HPF 0-5 0 - 5 /HPF   WBC Clumps PRESENT     Comment: Performed at [facility], [address] [place], [name] [address]  Urine Culture     Status: Abnormal   Collection Time: 06/28/24  9:33 PM   Specimen: Urine, Random  Result Value Ref Range   Specimen Description      URINE, RANDOM Performed at [facility], [address], [place], [name] [address]    Special Requests      NONE Reflexed from [id number] Performed at [facility], [address] [place], [name] [address]    Culture >=100,000 COLONIES/mL ESCHERICHIA COLI (A)    Report Status 06/30/2024 FINAL    Organism ID, Bacteria ESCHERICHIA COLI (A)       Susceptibility   Escherichia coli - MIC*    AMPICILLIN >=32 RESISTANT Resistant     CEFAZOLIN  (URINE) Value in next row Sensitive      8  SENSITIVEThis is a modified FDA-approved test that has been validated and its performance characteristics determined by the reporting laboratory.  This laboratory is certified under the Clinical Laboratory Improvement Amendments CLIA as qualified to perform high complexity clinical laboratory testing.    CEFEPIME  Value in next row Sensitive      8 SENSITIVEThis is a modified FDA-approved test that has been validated and its performance characteristics determined by the reporting laboratory.  This laboratory is certified under the Clinical Laboratory Improvement Amendments CLIA as qualified to perform high complexity clinical laboratory testing.    ERTAPENEM Value in next row Sensitive      8 SENSITIVEThis is a modified FDA-approved test that has been validated and its performance characteristics determined by the reporting laboratory.  This laboratory is certified under the Clinical Laboratory Improvement Amendments CLIA as qualified to perform high complexity clinical laboratory testing.    CEFTRIAXONE  Value in next row Sensitive      8 SENSITIVEThis is a modified FDA-approved test that has been validated and its performance characteristics determined by the reporting laboratory.  This laboratory is certified under the Clinical Laboratory Improvement Amendments CLIA as qualified to perform high complexity clinical laboratory testing.    CIPROFLOXACIN Value in next row Resistant  8 SENSITIVEThis is a modified FDA-approved test that has been validated and its performance characteristics determined by the reporting laboratory.  This laboratory is certified under the Clinical Laboratory Improvement Amendments CLIA as qualified to perform high complexity clinical laboratory testing.    GENTAMICIN Value in next row Sensitive      8 SENSITIVEThis is a modified FDA-approved test that has been validated and its performance characteristics determined by the reporting laboratory.  This laboratory is certified  under the Clinical Laboratory Improvement Amendments CLIA as qualified to perform high complexity clinical laboratory testing.    NITROFURANTOIN Value in next row Sensitive      8 SENSITIVEThis is a modified FDA-approved test that has been validated and its performance characteristics determined by the reporting laboratory.  This laboratory is certified under the Clinical Laboratory Improvement Amendments CLIA as qualified to perform high complexity clinical laboratory testing.    TRIMETH /SULFA  Value in next row Sensitive      8 SENSITIVEThis is a modified FDA-approved test that has been validated and its performance characteristics determined by the reporting laboratory.  This laboratory is certified under the Clinical Laboratory Improvement Amendments CLIA as qualified to perform high complexity clinical laboratory testing.    AMPICILLIN/SULBACTAM Value in next row Resistant      8 SENSITIVEThis is a modified FDA-approved test that has been validated and its performance characteristics determined by the reporting laboratory.  This laboratory is certified under the Clinical Laboratory Improvement Amendments CLIA as qualified to perform high complexity clinical laboratory testing.    PIP/TAZO Value in next row Sensitive      <=4 SENSITIVEThis is a modified FDA-approved test that has been validated and its performance characteristics determined by the reporting laboratory.  This laboratory is certified under the Clinical Laboratory Improvement Amendments CLIA as qualified to perform high complexity clinical laboratory testing.    MEROPENEM Value in next row Sensitive      <=4 SENSITIVEThis is a modified FDA-approved test that has been validated and its performance characteristics determined by the reporting laboratory.  This laboratory is certified under the Clinical Laboratory Improvement Amendments CLIA as qualified to perform high complexity clinical laboratory testing.    * >=100,000 COLONIES/mL  ESCHERICHIA COLI  C-reactive protein     Status: Abnormal   Collection Time: 06/29/24  4:58 AM  Result Value Ref Range   CRP 36.6 (H) <1.0 mg/dL    Comment: Performed at [facility], [address] [place], [name] [address]  CBC     Status: Abnormal   Collection Time: 06/29/24  5:00 AM  Result Value Ref Range   WBC 30.9 (H) 4.0 - 10.5 K/uL   RBC 3.45 (L) 4.22 - 5.81 MIL/uL   Hemoglobin 9.8 (L) 13.0 - 17.0 g/dL   HCT 67.5 (L) 60.9 - 47.9 %   MCV 93.9 80.0 - 100.0 fL   MCH 28.4 26.0 - 34.0 pg   MCHC 30.2 30.0 - 36.0 g/dL   RDW 83.3 (H) 88.4 - 84.4 %   Platelets 114 (L) 150 - 400 K/uL   nRBC 0.0 0.0 - 0.2 %    Comment: Performed at [facility], [address] [place], [name] [address]  Basic metabolic panel     Status: Abnormal   Collection Time: 06/29/24  5:00 AM  Result Value Ref Range   Sodium 137 135 - 145 mmol/L   Potassium 3.0 (L) 3.5 - 5.1 mmol/L   Chloride 93 (L) 98 - 111 mmol/L  CO2 30 22 - 32 mmol/L   Glucose, Bld 135 (H) 70 - 99 mg/dL    Comment: Glucose reference range applies only to samples taken after fasting for at least 8 hours.   BUN 27 (H) 6 - 20 mg/dL   Creatinine, Ser 4.27 (H) 0.61 - 1.24 mg/dL   Calcium  9.7 8.9 - 10.3 mg/dL   GFR, Estimated 11 (L) >60 mL/min    Comment: (NOTE) Calculated using the CKD-EPI Creatinine Equation (2021)    Anion gap 14 5 - 15    Comment: Performed at [facility], [address] [place], [name] [address]  Protime-INR     Status: Abnormal   Collection Time: 06/29/24  5:00 AM  Result Value Ref Range   Prothrombin Time 21.4 (H) 11.4 - 15.2 seconds   INR 1.8 (H) 0.8 - 1.2    Comment: (NOTE) INR goal varies based on device and disease states. Performed at [facility], [address] [place], [name] [address]   Sedimentation rate     Status: Abnormal   Collection Time: 06/29/24  5:00 AM  Result Value Ref Range   Sed Rate 91 (H) 0 - 16 mm/hr     Comment: Performed at [facility], [address], [place], [name] [address]  Basic metabolic panel with GFR     Status: Abnormal   Collection Time: 06/30/24 12:56 AM  Result Value Ref Range   Sodium 129 (L) 135 - 145 mmol/L   Potassium 2.6 (LL) 3.5 - 5.1 mmol/L    Comment: Critical Value, Read Back and verified with EDMUNDO HERO RN @ [phone] 06/30/24. GILBERTL   Chloride 91 (L) 98 - 111 mmol/L   CO2 24 22 - 32 mmol/L   Glucose, Bld 98 70 - 99 mg/dL    Comment: Glucose reference range applies only to samples taken after fasting for at least 8 hours.   BUN 33 (H) 6 - 20 mg/dL   Creatinine, Ser 4.65 (H) 0.61 - 1.24 mg/dL   Calcium  7.9 (L) 8.9 - 10.3 mg/dL   GFR, Estimated 12 (L) >60 mL/min    Comment: (NOTE) Calculated using the CKD-EPI Creatinine Equation (2021)    Anion gap 15 5 - 15    Comment: Performed at [facility] [address], [place], [name] [address]  CBC     Status: Abnormal   Collection Time: 06/30/24  6:19 AM  Result Value Ref Range   WBC 21.5 (H) 4.0 - 10.5 K/uL   RBC 2.79 (L) 4.22 - 5.81 MIL/uL   Hemoglobin 8.0 (L) 13.0 - 17.0 g/dL   HCT 73.8 (L) 60.9 - 47.9 %   MCV 93.5 80.0 - 100.0 fL   MCH 28.7 26.0 - 34.0 pg   MCHC 30.7 30.0 - 36.0 g/dL   RDW 83.2 (H) 88.4 - 84.4 %   Platelets 92 (L) 150 - 400 K/uL    Comment: SPECIMEN CHECKED FOR CLOTS PLATELET COUNT CONFIRMED BY SMEAR REPEATED TO VERIFY Immature Platelet Fraction may be clinically indicated, consider ordering this additional test [id number]    nRBC 0.0 0.0 - 0.2 %    Comment: Performed at [facility], [address], [place], [name] [address]  Vancomycin , random     Status: None   Collection Time: 06/30/24  6:19 AM  Result Value Ref Range   Vancomycin  Rm 16 ug/mL    Comment: Performed at [facility], [address]  [place], [name] [address]  Phosphorus     Status: Abnormal   Collection Time: 06/30/24  6:19 AM  Result  Value Ref Range   Phosphorus 2.3 (L) 2.5 - 4.6 mg/dL    Comment: Performed at [facility], [address], [place], [name] [address]  Basic metabolic panel with GFR     Status: Abnormal   Collection Time: 06/30/24  6:19 AM  Result Value Ref Range   Sodium 120 (L) 135 - 145 mmol/L    Comment: Electrolytes repeated to verify  Delta check noted     Potassium >7.5 (HH) 3.5 - 5.1 mmol/L    Comment: Critical Value, Read Back and verified with SHIRLEAN BRAVO RN AT [phone] ON 06/30/2024 BY PRUDY, K   Chloride 96 (L) 98 - 111 mmol/L   CO2 22 22 - 32 mmol/L   Glucose, Bld 83 70 - 99 mg/dL    Comment: Glucose reference range applies only to samples taken after fasting for at least 8 hours.   BUN 31 (H) 6 - 20 mg/dL   Creatinine, Ser 4.56 (H) 0.61 - 1.24 mg/dL   Calcium  7.5 (L) 8.9 - 10.3 mg/dL   GFR, Estimated 12 (L) >60 mL/min    Comment: (NOTE) Calculated using the CKD-EPI Creatinine Equation (2021)    Anion gap 2 (L) 5 - 15    Comment: Performed at [facility], [address], [place], [name] [address]  Magnesium      Status: Abnormal   Collection Time: 06/30/24  6:19 AM  Result Value Ref Range   Magnesium  1.4 (L) 1.7 - 2.4 mg/dL    Comment: Performed at [facility], [address], [place], [name] [address]    MR FOOT RIGHT WO CONTRAST Result Date: 06/29/2024 CLINICAL DATA:  Soft tissue infection suspected, foot, xray done ulcer, osteomyelitis EXAM: MRI OF THE RIGHT FOREFOOT WITHOUT CONTRAST TECHNIQUE: Multiplanar, multisequence MR imaging of the right foot was performed. No intravenous contrast was administered. COMPARISON:  Right foot radiographs dated 06/18/2024. FINDINGS: Bones/Joint/Cartilage Redemonstrated destruction with corresponding marrow signal abnormality of the fifth metatarsal head and neck extending into the mid shaft and the base of the fifth proximal phalanx, compatible with osteomyelitis. No convincing marrow signal  abnormality identified elsewhere to suggest acute osteomyelitis. Status post amputation of the second toe. Mild degenerative changes of the first MTP joint in the midfoot. No significant joint effusion. No acute fracture or dislocation. Ligaments Lisfranc ligament is intact. Soft tissue/Muscles and Tendons Soft tissue wound at the proximal midfoot demonstrating suspected packing material. There is underlying extensive subcutaneous soft tissue edema extending along the plantar aspect of the midfoot with foci of susceptibility artifact compatible with soft tissue gas within the subcutaneous fat of the plantar foot extending through the plantar plate into the flexor digitorum brevis muscle (series 7, images 5-13). Extensive surrounding T2 hyperintense signal without discrete loculated collection (series 7, images 9-17). These findings are concerning for gas-forming soft tissue infection with organizing phlegmonous change versus early abscess formation. Additional wounds at the lateral midfoot and forefoot at the level of the mid fifth metatarsal shaft and base of the fifth toe with wound tracking to the level of the destroyed fifth metatarsal distal shaft/head and neck with overlying thin curvilinear fluid collection measuring up to 2.6 cm AP x 0.3 cm TR x 2.2 cm craniocaudal (series 9, images 15-19 and series 7, images 19-23). Diffuse cutaneous thickening and irregularity of the foot. Diffusely increased T2 signal of  the intrinsic foot musculature may reflect chronic denervation changes and/or myositis. IMPRESSION: 1. Osteomyelitis with destruction of the distal half of the fifth metatarsal and base of the fifth proximal phalanx. 2. Soft tissue wound at the proximal midfoot demonstrating suspected packing material. There is underlying extensive subcutaneous soft tissue edema extending along the plantar aspect of the midfoot with foci of susceptibility artifact compatible with soft tissue gas within the subcutaneous  fat of the plantar foot extending deep through the plantar plate into the flexor digitorum brevis muscle. Extensive surrounding heterogenous fluid signal without discrete loculated collection. These findings are concerning for gas-forming soft tissue infection with organizing phlegmonous change versus early abscess formation. 3. Additional wounds at the lateral midfoot and forefoot at the level of the mid fifth metatarsal shaft and base of the fifth toe. Wound tracts to the level of the destroyed fifth metatarsal distal shaft/head and neck with overlying thin curvilinear fluid collection measuring up to 2.6 x 2.2 x 0.3 cm. 4. Diffusely increased T2 signal of the intrinsic foot musculature may reflect chronic denervation changes and/or myositis. Electronically Signed   By: Harrietta Sherry M.D.   On: 06/29/2024 11:58   CT ABDOMEN PELVIS WO CONTRAST Result Date: 06/28/2024 EXAM: CT ABDOMEN AND PELVIS WITHOUT CONTRAST 06/28/2024 06:28:43 PM TECHNIQUE: CT of the abdomen and pelvis was performed without the administration of intravenous contrast. Multiplanar reformatted images are provided for review. Automated exposure control, iterative reconstruction, and/or weight-based adjustment of the mA/kV was utilized to reduce the radiation dose to as low as reasonably achievable. COMPARISON: 02/04/2023 CLINICAL HISTORY: Bowel obstruction suspected. Nausea, vomiting, diarrhea, abdominal pain FINDINGS: LOWER CHEST: Small - moderate right pleural effusion. Cardiomegaly. Interlobular septal thickening and ground glass opacities in the lower lobes suggestive of edema. LIVER: Marked hepatomegaly. Hepatic steatosis. GALLBLADDER AND BILE DUCTS: Sludge in the gallbladder. No evidence of acute cholecystitis. No biliary ductal dilatation. SPLEEN: The spleen is mildly enlarged measuring 14.5 cm in craniocaudal dimension. PANCREAS: No acute abnormality. ADRENAL GLANDS: No acute abnormality. KIDNEYS, URETERS AND BLADDER: No stones in  the kidneys or ureters. No hydronephrosis. No perinephric or periureteral stranding. Diffuse bladder wall thickening of the nondistended bladder. Trace adjacent perivesical stranding. GI AND BOWEL: Stomach demonstrates no acute abnormality. There is no bowel obstruction. PERITONEUM AND RETROPERITONEUM: Small volume or abdominal pelvic ascites. No free air. VASCULATURE: Aorta is normal in caliber. Aortic atherosclerotic calcification. LYMPH NODES: 1.2 cm right iliac chain lymph node on series 2 image 75 has increased from 02/04/2023 when it measured 1.0 cm and 1.5 cm right iliac chain lymph node on series 2 image 60 was not well visualized previously due to ascites. Additional shotty subcentimeter retroperitoneal and iliac chain lymph nodes are similar. Marked enlargement of a right inguinal lymph node on series 2 image 98 measuring 2.4 cm. This is similar to 02/04/2023. Given relative stability since 02/04/2023 these are favored reactive. REPRODUCTIVE ORGANS: No acute abnormality. BONES AND SOFT TISSUES: No acute osseous abnormality. Body wall edema. No focal soft tissue abnormality. IMPRESSION: 1. No evidence of bowel obstruction. 2. Bladder wall thickening and trace perivesical stranding. Correlate for cystitis. 3. Hepatosplenomegaly. Small volume abdominal and pelvic ascites. 4. Small - moderate effusion with pulmonary edema in the lower lungs. Cardiomegaly. Electronically signed by: Norman Gatlin MD 06/28/2024 06:46 PM EST RP Workstation: [id number]    ROS: As per H&P, rest of the systems reviewed and negative. Blood pressure 101/62, pulse 79, temperature 98.5 F (36.9 C), temperature source Oral, resp. rate 18, height 5'  10 (1.778 m), SpO2 96%. Gen: NAD, somnolent male lying on bed comfortable  Respiratory: Clear bilateral, no wheezing or crackle Cardiovascular: Regular rate rhythm S1-S2 normal, no rubs GI: Abdomen soft, nontender, nondistended Extremities, right foot has some trace edema.   Wound. Skin: No rash or ulcer Neurology: Alert, awake, but somnolent dialysis Access: AV fistula has good thrill and bruit.  Assessment/Plan:  # Right foot diabetic ulcer, osteomyelitis: Currently on ceftriaxone  and vancomycin .  Seen by podiatrist with plan to take him to the OR.  # ESRD: TTS, last outpatient HD was on 12/24.  We will plan for dialysis today at [facility].  He needs to be transferred from [facility] to [facility].  Discussed with the primary team.  # Hypertension: BP soft, monitor, volume status acceptable.  # Anemia of ESRD: Hemoglobin dropping, received Mircera on 12/18.  Transfuse as needed.  # Metabolic Bone Disease: Currently n.p.o., monitor calcium  phosphorus level.  Discussed with the primary team and dialysis nurse.  Lilliah Priego Amelie Romney 06/30/2024, 9:40 AM      [1] No Known Allergies

## 2024-06-30 NOTE — Brief Op Note (Signed)
 06/30/2024  1:57 PM  PATIENT:  Todd Mccoy  53 y.o. male  PRE-OPERATIVE DIAGNOSIS: Osteomyelitis with gangrene right foot  POST-OPERATIVE DIAGNOSIS: Same  PROCEDURE:  Chopart amputation right foot  SURGEON:  Surgeons and Role:    DEWAINE Janit Thresa CHRISTELLA, DPM - Primary  PHYSICIAN ASSISTANT: None  ASSISTANTS: none   ANESTHESIA:   local and IV sedation  EBL:  10mL   BLOOD ADMINISTERED:none  DRAINS: none   LOCAL MEDICATIONS USED:  MARCAINE    , LIDOCAINE  , and Amount: 20 ml  SPECIMEN:  Source of Specimen:  RT foot. Cultures sent to path  DISPOSITION OF SPECIMEN:  PATHOLOGY  COUNTS:  YES  TOURNIQUET: Calf tourniquet inflated to a pressure of without esmarch bandage exanguination  DICTATION: .Dragon Dictation  PLAN OF CARE: Admit to inpatient   PATIENT DISPOSITION:  PACU - hemodynamically stable.   Delay start of Pharmacological VTE agent (>24hrs) due to surgical blood loss or risk of bleeding: not applicable

## 2024-06-30 NOTE — Anesthesia Preprocedure Evaluation (Addendum)
"                                    Anesthesia Evaluation  Patient identified by MRN, date of birth, ID band Patient awake    Reviewed: Allergy & Precautions, H&P , NPO status , Patient's Chart, lab work & pertinent test results, reviewed documented beta blocker date and time   Airway Mallampati: II  TM Distance: >3 FB Neck ROM: Full    Dental no notable dental hx. (+) Teeth Intact, Dental Advisory Given   Pulmonary Current Smoker   Pulmonary exam normal breath sounds clear to auscultation       Cardiovascular hypertension, Pt. on medications and Pt. on home beta blockers + Peripheral Vascular Disease and +CHF   Rhythm:Regular Rate:Normal     Neuro/Psych   Anxiety     negative neurological ROS     GI/Hepatic negative GI ROS, Neg liver ROS,,,  Endo/Other  diabetes, Insulin  Dependent    Renal/GU Renal InsufficiencyRenal disease  negative genitourinary   Musculoskeletal   Abdominal   Peds  Hematology  (+) Blood dyscrasia, anemia   Anesthesia Other Findings   Reproductive/Obstetrics negative OB ROS                              Anesthesia Physical Anesthesia Plan  ASA: 3  Anesthesia Plan: MAC   Post-op Pain Management: Ofirmev  IV (intra-op)*   Induction: Intravenous  PONV Risk Score and Plan: 2 and Ondansetron , Dexamethasone and Propofol  infusion  Airway Management Planned: Simple Face Mask and Natural Airway  Additional Equipment:   Intra-op Plan:   Post-operative Plan:   Informed Consent: I have reviewed the patients History and Physical, chart, labs and discussed the procedure including the risks, benefits and alternatives for the proposed anesthesia with the patient or authorized representative who has indicated his/her understanding and acceptance.     Dental advisory given  Plan Discussed with: CRNA  Anesthesia Plan Comments:          Anesthesia Quick Evaluation  "

## 2024-06-30 NOTE — Interval H&P Note (Signed)
 History and Physical Interval Note:  06/30/2024 12:40 PM  Todd Mccoy  has presented today for surgery, with the diagnosis of right foot.  The various methods of treatment have been discussed with the patient and family. After consideration of risks, benefits and other options for treatment, the patient has consented to  Procedures: AMPUTATION, FOOT, TRANSMETATARSAL (Right) as a surgical intervention.  The patient's history has been reviewed, patient examined, no change in status, stable for surgery.  I have reviewed the patient's chart and labs.  Questions were answered to the patient's satisfaction.     Thresa CHRISTELLA Sar

## 2024-06-30 NOTE — Anesthesia Postprocedure Evaluation (Signed)
"   Anesthesia Post Note  Patient: Todd Mccoy  Procedure(s) Performed: AMPUTATION, FOOT, TRANSMETATARSAL (Right: Foot)     Patient location during evaluation: PACU Anesthesia Type: MAC Level of consciousness: awake and alert Pain management: pain level controlled Vital Signs Assessment: post-procedure vital signs reviewed and stable Respiratory status: spontaneous breathing, nonlabored ventilation, respiratory function stable and patient connected to nasal cannula oxygen Cardiovascular status: stable and blood pressure returned to baseline Postop Assessment: no apparent nausea or vomiting Anesthetic complications: no   No notable events documented.  Last Vitals:  Vitals:   06/30/24 1500 06/30/24 1515  BP: 113/67 110/70  Pulse: 70 70  Resp: 12 18  Temp:    SpO2: 98% 97%    Last Pain:  Vitals:   06/30/24 1444  TempSrc:   PainSc: 8                  Erlinda Solinger,W. EDMOND      "

## 2024-06-30 NOTE — Progress Notes (Signed)
 Patient with diabetic ulcer right foot. MRI dings consistent with extensive gas throughout the tissues of the foot. Patient currently being transferred to [facility] due to ESRD requiring dialysis. Requires emergent amputation/I&D right foot. Scheduled tentatively for today 11:30-12pm coming directly from [facility] to short-stay. Currently NPO. High risk of more proximal amputation during admission depending on ABIs which are pending and extensiveness of soft tissue infection.   -Dr. Janit

## 2024-06-30 NOTE — Op Note (Signed)
" ° °  OPERATIVE REPORT Patient name: Todd Mccoy MRN: [id number] DOB: 08/31/1970  DOS: 06/30/2024  Preop Dx: Vickie gangrene right foot Postop Dx: same  Procedure:  1.  Chopart amputation right foot  Surgeon: Thresa EMERSON Sar DPM  Anesthesia: IV sedation with 50-50 mixture of 2% lidocaine  plain with 0.5% Marcaine  plain totaling 20 mL infiltrated in the patient's right ankle  Hemostasis: Calf tourniquet inflated to a pressure of without esmarch exsanguination   EBL: 10 mL Materials: None Injectables: None Pathology: Right foot.  Culture swabs also taken and sent to pathology for C&S  Condition: The patient tolerated the procedure and anesthesia well. No complications noted or reported   Justification for procedure: The patient is a 53 y.o. male presenting for surgical correction of osteomyelitis with extensive gas gangrene right foot. The patient consent form reviewed. No guarantees were expressed or implied.   Procedure in Detail: The patient was brought to the operating room, placed on the operating table in the supine position at which time an aseptic scrub and drape were performed about the patient's respective lower extremity after anesthesia was induced as described above. Attention was then directed to the surgical area where procedure number one commenced.  Procedure #1: Chopart amputation right foot  A fishmouth type incision was planned and made around the proximal portion of the right foot directly down to the level of bone.  Extensive amount of purulent drainage and necrotic tissue was noted especially plantar.  The foot was disarticulated at the level of the Chopart joint (talonavicular and calcaneocuboid joints).  The foot was removed and sent for discard.  Cultures taken of the necrotic tissue and sent to pathology for culture and sensitivities.    Copious irrigation as well as massaging the tissue to express more proximal purulent drainage was performed.  Any  open visible arterial lumens were tied with 4-0 Vicryl.  Compressive wet to dry dressings were then applied to the patient's amputation site.  Amputation site was left open to allow for drainage.  Tourniquet deflated.  The patient was then transferred from the operating room to the recovery room having tolerated the procedure and anesthesia well. All vital signs are stable. After a brief stay in the recovery room the patient was readmitted to inpatient room with postoperative orders placed.    IMPRESSION: Extensive gas gangrene right foot.  Nonsalvageable.  Recommend orthopedic consult for BKA.  Thresa EMERSON Sar, DPM [facility]  Dr. Thresa EMERSON Sar, DPM    [address] [place], [name] [address]                Office [phone]  Fax [phone]   "

## 2024-06-30 NOTE — Consult Note (Addendum)
 "  PODIATRY CONSULTATION  NAME Todd Mccoy MRN [id number] DOB 1971/03/07 DOA 06/28/2024   Reason for consult: Gas gangrene right foot Chief Complaint  Patient presents with   Abdominal Pain   Emesis   Diarrhea    Consulting physician: Joette Pebbles, MD; [facility]  History of present illness: 53 y.o. male PMHx CHF, T2DM, CKD stage IV, diabetic retinopathy, BKA LLE, admitted to [facility] for worsening infection right foot osteomyelitis.  Podiatry consulted.  MRI was ordered demonstrating extensive gas gangrene throughout the plantar compartment of the foot.  Transferred to [facility] this AM for hemodialysis and emergent partial foot amputation.   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/2020   pt states recent MD visit /left eye worse than right   Dyslipidemia 12/10/2020   Hx of BKA, left (HCC)    Hypertension    Morbid obesity (HCC)    Osteomyelitis (HCC)    Pneumonia    Pulmonary HTN (HCC)    mild to moderate (PASP40mmHg) by echo 07/2023       Latest Ref Rng & Units 06/30/2024    6:19 AM 06/29/2024    5:00 AM 06/28/2024    6:33 PM  CBC  WBC 4.0 - 10.5 K/uL 21.5  30.9  30.2   Hemoglobin 13.0 - 17.0 g/dL 8.0  9.8  9.4   Hematocrit 39.0 - 52.0 % 26.1  32.4  31.1   Platelets 150 - 400 K/uL 92  114  112        Latest Ref Rng & Units 06/30/2024    9:12 AM 06/30/2024    6:19 AM 06/30/2024   12:56 AM  BMP  Glucose 70 - 99 mg/dL  83  98   BUN 6 - 20  mg/dL  31  33   Creatinine 9.38 - 1.24 mg/dL  4.56  4.65   Sodium 864 - 145 mmol/L  120  129   Potassium 3.5 - 5.1 mmol/L 4.5  >7.5  2.6   Chloride 98 - 111 mmol/L  96  91   CO2 22 - 32 mmol/L  22  24   Calcium  8.9 - 10.3 mg/dL  7.5  7.9       Physical Exam: Gas gangrene noted right foot with strong malodor.  Open wound to the lateral aspect of the right forefoot around the fifth metatarsal.  History of BKA LLE  ASSESSMENT/PLAN OF CARE Acute osteomyelitis with extensive gas gangrene right foot  -Patient seen in the preoperative holding area at [facility] today.  When discussing his situation he was unable to answer coherently with mental fogginess.  Family was contacted, both his brother Darryl as well as his niece Aliyah.  Discussed with him the urgency of the situation and the gas gangrene to the foot.  Discussed the need for emergent partial foot amputation to potentially stop the progression of the gas gangrene.  Explained that below-knee amputation will  likely need to occur as well.  Will need to discuss with Ortho and sign off to them.  -Updated ABIs pending.  ABIs 11/26/2022 within normal range RLE. -Preoperative orders were placed which will consist of partial foot amputation right with incision and drainage right.   Thank you for the consult.  Please contact me directly via secure chat with any questions or concerns.     Thresa EMERSON Sar, DPM [facility]  Dr. Thresa EMERSON Sar, DPM    [address] [place], [name] [address]                Office [phone]  Fax [phone]     "

## 2024-06-30 NOTE — Plan of Care (Signed)
" °  Problem: Education: Goal: Knowledge of General Education information will improve Description: Including pain rating scale, medication(s)/side effects and non-pharmacologic comfort measures Outcome: Progressing   Problem: Health Behavior/Discharge Planning: Goal: Ability to manage health-related needs will improve Outcome: Progressing   Problem: Clinical Measurements: Goal: Ability to maintain clinical measurements within normal limits will improve Outcome: Progressing Goal: Will remain free from infection Outcome: Progressing Goal: Diagnostic test results will improve Outcome: Progressing Goal: Respiratory complications will improve Outcome: Progressing Goal: Cardiovascular complication will be avoided Outcome: Progressing   Problem: Elimination: Goal: Will not experience complications related to bowel motility Outcome: Progressing Goal: Will not experience complications related to urinary retention Outcome: Progressing   Problem: Coping: Goal: Level of anxiety will decrease Outcome: Progressing   Problem: Nutrition: Goal: Adequate nutrition will be maintained Outcome: Progressing   Problem: Pain Managment: Goal: General experience of comfort will improve and/or be controlled Outcome: Progressing   Problem: Safety: Goal: Ability to remain free from injury will improve Outcome: Progressing   Problem: Skin Integrity: Goal: Risk for impaired skin integrity will decrease Outcome: Progressing   "

## 2024-06-30 NOTE — Anesthesia Procedure Notes (Signed)
 Procedure Name: MAC Date/Time: 06/30/2024 1:15 PM  Performed by: Arvell Edsel HERO, CRNAPre-anesthesia Checklist: Patient identified, Emergency Drugs available, Suction available, Patient being monitored and Timeout performed Patient Re-evaluated:Patient Re-evaluated prior to induction Oxygen Delivery Method: Simple face mask

## 2024-06-30 NOTE — Progress Notes (Signed)
 Lab called to give critical lab value K+ 2.6.  RN notified.

## 2024-06-30 NOTE — Transfer of Care (Signed)
 Immediate Anesthesia Transfer of Care Note  Patient: Todd Mccoy  Procedure(s) Performed: AMPUTATION, FOOT, TRANSMETATARSAL (Right: Toe)  Patient Location: PACU  Anesthesia Type:MAC  Level of Consciousness: drowsy and patient cooperative  Airway & Oxygen Therapy: Patient Spontanous Breathing and Patient connected to face mask oxygen  Post-op Assessment: Report given to RN, Post -op Vital signs reviewed and stable, Patient moving all extremities, and Patient moving all extremities X 4  Post vital signs: Reviewed and stable  Last Vitals:  Vitals Value Taken Time  BP 120/75 06/30/24 14:22  Temp    Pulse 72 06/30/24 14:22  Resp 12 06/30/24 14:22  SpO2 100 06/30/24 14:22    Last Pain:  Vitals:   06/30/24 1221  TempSrc: Oral  PainSc:          Complications: No notable events documented.

## 2024-06-30 NOTE — Consult Note (Signed)
 Orthopedic Surgery Consult Note  Assessment: Patient is a 53 y.o. male with right leg necrotizing soft tissue infection   Plan: -This infection has been initially treated. Dr. Janit did note that it seemed to track higher and he irrigated out this area but did not fully excise the tissue or perform a below knee amputation. Since this infection has been initially addressed, feel that below knee amputation can be done in a urgent fashion tomorrow morning. I agree with Dr. Janit that if the infection was tracking higher then further surgery is needed to remove all the infected and nonviable tissue.  -Diet: NPO at midnight  -DVT ppx: per primary -Antibiotics: per primary  -Weight bearing status: as tolerated -PT evaluate and treat -Pain control -Dispo: pending completion of operative plans  I had a long conversation with the patient's brother, Darryl, over the phone. I explained the seriousness of this type of infection and that this can represent a life-threatening infection. I told him what Dr. Janit had seen intra-operatively. He informed me that Dr. Janit told him something similar. I recommended below knee amputation to him to address this potentially life-threatening infection. I covered the risks, benefits, and alternatives of surgery with him. After this conversation, all of his questions were answered to his satisfaction. He elected to proceed with surgery on the patient's behalf. This consent was witness by the charge nurse who signed the consent as well. We will plan for operative management tomorrow morning.   ___________________________________________________________________________   Reason for consult: right leg necrotizing soft tissue infection  History:  Patient is a 53 y.o. male who has a history of diabetic retinopathy, diabetic neuropathy, ESRD, and active nicotine use who presented to the hospital with nausea and vomiting. He was admitted to a medicine service. He has been  seeing the wound center and podiatry for right foot osteomyelitis. There was concern for necrotizing infection so podiatry took him for emergent amputation on 12/27/025. Dr. Janit performed the surgery and post-operatively consulted orthopedics because the infection tracked further proximal into the hindfoot. He felt that a below knee amputation was needed and this was beyond the scope of his practice. Patient is now back on the floor after surgery. He is somnolent and not able to provide any further history.   Patient not able to provide past medical, surgical, social, or family histories due to his condition. Chart information below.   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/2020   pt states recent MD visit /left eye worse than right   Dyslipidemia 12/10/2020   Hx of BKA, left (HCC)    Hypertension    Morbid obesity (HCC)    Osteomyelitis (HCC)    Pneumonia    Pulmonary HTN (HCC)    mild to moderate (PASP84mmHg) by echo 07/2023    Past Surgical History:  Procedure Laterality Date   ABSCESS DRAINAGE     neck   AMPUTATION Left 11/14/2020   Procedure:  LEFT 5TH RAY AMPUTATION;  Surgeon: Harden Jerona GAILS, MD;  Location: [facility] OR;  Service: Orthopedics;  Laterality: Left;   AMPUTATION Left 12/10/2020   Procedure: LEFT BELOW KNEE AMPUTATION;  Surgeon: Harden Jerona GAILS, MD;  Location: [facility] OR;  Service: Orthopedics;  Laterality: Left;   AMPUTATION TOE Right 03/25/2020   Procedure: AMPUTATION TOE;  Surgeon: Gretel Ozell PARAS, DPM;  Location: [facility] ORS;  Service: Podiatry;  Laterality: Right;   AV FISTULA PLACEMENT Right 05/23/2023   Procedure: RIGHT ARM Brachiocephalic ARTERIOVENOUS (AV) FISTULA CREATION;  Surgeon: Lanis Fonda BRAVO, MD;  Location: [facility] OR;  Service: Vascular;  Laterality: Right;   INCISION AND DRAINAGE ABSCESS Right 09/07/2014   Procedure: INCISION AND DRAINAGE ABSCESS RIGHT FLANK;  Surgeon: Krystal Russell, MD;  Location: [facility] ORS;  Service: General;  Laterality: Right;   IR FLUORO GUIDE CV LINE RIGHT  02/11/2023   IR PARACENTESIS  10/03/2023   IR PARACENTESIS  10/19/2023   IR PARACENTESIS  11/07/2023   IR PARACENTESIS  11/30/2023   IR PARACENTESIS  12/23/2023   IR PARACENTESIS  02/03/2024   IR PARACENTESIS  04/16/2024   IR PARACENTESIS  05/14/2024   IR REMOVAL TUN CV CATH W/O FL  11/30/2023   IR US  GUIDE VASC ACCESS RIGHT  02/11/2023   LEG AMPUTATION BELOW KNEE Left 12/10/2020   SCROTUM EXPLORATION      Social History   Tobacco Use   Smoking status: Every Day    Current packs/day: 0.00    Average packs/day: 0.5 packs/day for 32.0 years (16.0 ttl pk-yrs)    Types: Cigarettes    Start date: 08/24/1989    Last attempt to quit: 08/24/2021    Years since quitting: 2.8    Passive exposure: Past   Smokeless tobacco: Former   Tobacco comments:    Quit smoking previously February 2023  Substance Use Topics   Alcohol  use: No   Family History  Problem Relation Age of Onset   Diabetes Mother    Diabetes Father    Diabetes Brother    Colon cancer Neg Hx    Esophageal cancer Neg Hx    Pancreatic cancer Neg Hx    Stomach cancer Neg Hx    Liver disease Neg Hx    CAD Neg Hx     Physical Exam:  General: no acute distress, laying in bed Neurologic: sleeping, awakes to physical stimulus, does not respond to questions appropriately, not following commands, returns to sleep without continues stimulation Cardiovascular: regular rate, no cyanosis Respiratory: symmetric chest rise, nasal canula in  place  MSK:    -Right lower extremity  Small amount of blood oozing from surgical site, no palpable crepitus, no erythema seen Patient not participatory with sensorimotor exam Residual limb warm and well perfused  Imaging: XRs of the right foot from 06/30/2024 was independently reviewed and interpreted, showing gas within the soft tissue of the plantar aspect of the foot that extends from the anterior aspect of the calcaneus. No fracture or dislocation seen.   LINREC score of 12  I discussed this patient's management with Dr. Janit in the afternoon of 12/27. He covered what was seen intra-operatively. We talked about some of his laboratory abnormalities and discussed his management going forward and the likely need for a below knee amputation.    Patient name: Todd Mccoy Patient MRN: [id number] Date: 06/30/2024

## 2024-06-30 NOTE — Progress Notes (Signed)
 Pharmacy Antibiotic Note  Todd Mccoy is a 53 y.o. male admitted on 06/28/2024 with osteomyelitis and DFU. Pharmacy has been consulted for vancomycin  dosing.  Vancomycin  random level ~32 hrs after first dose was found to be therapeutic at 16. While initially it was felt patient had CKD it was found that he actually is on dialysis. Nephrology is being consulted with plans to dialyze today at [facility]. Also with plans to go to OR at [facility] for emergent amputation/I&D of R foot.  Plan: F/U HD and OR today for next vancomycin  dose Monitor clinical picture, vanc levels prn F/U C&S, abx deescalation / LOT  Height: 5' 10 (177.8 cm) IBW/kg (Calculated) : 73  Temp (24hrs), Avg:99.7 F (37.6 C), Min:98.5 F (36.9 C), Max:100.3 F (37.9 C)  Recent Labs  Lab 06/28/24 1833 06/29/24 0500 06/30/24 0056 06/30/24 0619  WBC 30.2* 30.9*  --  21.5*  CREATININE 4.71* 5.72* 5.34* 5.43*  VANCORANDOM  --   --   --  16    Estimated Creatinine Clearance: 16.6 mL/min (A) (by C-G formula based on SCr of 5.43 mg/dL (H)).    Allergies[1]  Antimicrobials this admission: 12/25 vanc >> 12/25 CTX >>  Dose adjustments this admission:   Microbiology results: UCx 12/25: E. Coli (resistant to amp, cipro, and Unasyn) Resp panel neg  Thank you for allowing pharmacy to be a part of this patients care.  Rankin Dee, PharmD, BCPS, BCIDP Clinical Pharmacist 06/30/2024 9:16 AM        [1] No Known Allergies

## 2024-06-30 NOTE — Progress Notes (Signed)
" ° °  Brief Progress Note   _____________________________________________________________________________________________________________  Patient Name: Todd Mccoy Patient DOB: 1971/01/01 Date: 06/30/2024     Data: Patient needing to tsf to [facility] for HD. Recv'd message from Dr Verdene. Transfer order placed. Patient also on list for Sx at [facility] around [place].    Action: Spoke with Margo in Patient Placement. No Med Surg beds available at this time.     Response:  Updated Dr Verdene in secure chat thread that no Med Surg beds at this time but Patient Placement aware of need. [facility] transport arranged. Patient will be transferred to his surgery at [facility] then placed in IP Bed.  _____________________________________________________________________________________________________________  [facility] RN [employer] Taiya Nutting Please contact us  directly via secure chat (search for [facility]) or by calling us  at [phone] [facility]).  "

## 2024-06-30 NOTE — Consult Note (Signed)
 WOC Nurse Consult Note: patient followed at wound care center for R 5th digit and lateral foot wounds; confirmed osteomyelitis, has been seen outpatient by podiatry as well  Reason for Consult: wounds R foot  Wound type: full thickness r/t neuropathy  Pressure Injury POA: NA  Measurement: see nursing flowsheet  Wound bed: red meaty in last photos from podiatry office  Drainage (amount, consistency, odor) see nursing flowsheet  Periwound: Dressing procedure/placement/frequency: Cleanse R 5th toe/foot wounds with Vashe, do not rinse.  Using a Q tip applicator apply Vashe moistened gauze into wound bed, cover with dry gauze/ABD pad.  Secure dressing with Kerlix roll gauze.   Podiatry has been consulted on this patient and will manage wound going forward.  Any wound care orders placed by podiatry supercede wound care orders placed by [facility] RN.    WOC team will not follow. Reconsult if further needs arise.   Thank you,    Powell Bar MSN, RN-BC, [employer]

## 2024-06-30 NOTE — Progress Notes (Signed)
 Report called to 5N RN at [facility]. RN aware that [facility] has already picked patient up for transport.

## 2024-06-30 NOTE — Progress Notes (Addendum)
" ° °  Spoke with Dr. Ozell Ada, [place], postoperatively regarding patient and need for BKA. TOC and management of RLE infection to orthopedics.  Hospitalist, Dr. Joette, made aware.   Also spoke with patient's brother and niece regarding the need for more proximal below-knee amputation.  They are understanding and amenable to this plan.  Podiatry will sign off.     Thresa EMERSON Sar, DPM [facility]  Dr. Thresa EMERSON Sar, DPM    [address] [place], [name] [address]                Office [phone]  Fax [phone]    "

## 2024-06-30 NOTE — Progress Notes (Signed)
 CRITICAL VALUE STICKER  CRITICAL VALUE: Potassium >7.5  RECEIVER (on-site recipient of call): Damien Stager LPN  DATE & TIME NOTIFIED: 06/30/2024 0850  MESSENGER (representative from lab): Kong  MD NOTIFIED: Joette Pebbles, MD  TIME OF NOTIFICATION: [phone]  RESPONSE: Orders for repeat potassium lab in, redrawn, new potassium level is 4.5 at 0912. No new orders.

## 2024-06-30 NOTE — Progress Notes (Signed)
 "  [facility] PROGRESS NOTE   Todd Mccoy FMW:[id number] DOB: 1971/01/30 DOA: 06/28/2024  PCP: Delbert Clam, MD  Brief History: 54 y.o. male with medical history significant of CHF, type 2 diabetes, hyperlipidemia who presented to the emergency department due to nausea and vomiting.  Patient has been nauseous over the last several days.  He is currently undergoing treatment for osteomyelitis of his right foot by podiatry.  Due to feeling poorly he presented to the ER.  Patient underwent CT abdomen which showed no evidence of obstruction however cystitis.  Patient was admitted for further workup.   Consultants: Podiatry.  Nephrology  Procedures: ABIs    Subjective/Interval History: Patient somnolent this morning but easily arousable.  Complains of pain in his right foot.  Somewhat distracted as well.     Assessment/Plan:  Cellulitis/abscess right foot/diabetic foot ulcer/osteomyelitis  Symptoms thought to be due to acute infection.  He is followed by podiatry.  Has a previous history of left below-knee amputation.  Has chronic wound in the right foot.  Recent x-ray showed evidence of osteomyelitis.   Patient underwent MRI foot which raises concern for osteomyelitis, abscess and gas.  Discussed with Dr. Janit yesterday and again this morning.  He plans to take to the OR today.  This will be done at [facility]. ABIs were ordered and still pending. Continue with broad-spectrum antibiotics with vancomycin  and ceftriaxone . Follow-up on cultures.  WBC has improved from 30,000-21,000.   End-stage renal disease  Initially it was felt that patient had CKD stage IV I.  Patient did not tell anybody yesterday that he is actually on dialysis.  It was found out today that he is actually a ESRD patient on dialysis.  Unclear of his last dialysis.  Nephrology has been consulted.  They plan to dialyze him today. He has had fluctuating potassium levels.  The level of greater than 7.5 is  likely erroneous.  It was drawn when he was getting potassium infusion which was ordered for a potassium level of 2.6.  Potassium level to be redrawn.  Acute metabolic encephalopathy Likely due to his acute issues.  Sedative agents will be discontinued especially the gabapentin .  Normocytic anemia Likely anemia of chronic kidney disease.  Drop in hemoglobin is dilutional.  No evidence of overt blood loss except for some oozing from his foot.  Mild thrombocytopenia Seems to be chronic.  Continue to monitor.  Essential hypertension Amlodipine  on hold.  Will also hold his carvedilol .  The dose was decreased yesterday but blood pressure remains borderline low.  Abnormal UA Probably not suggestive of infection since ESRD.  Coronary artery disease Cardiac status appears to be stable.  Chronic systolic CHF Seems to be stable.  Somewhat hypovolemic. Based on echocardiogram from January 2025 his LVEF is 45 to 50%.  Grade 2 diastolic dysfunction was noted.  Moderate pericardial effusion was noted at that time.  No significant valvular abnormalities. Volume being managed with hemodialysis.  DVT Prophylaxis: Lovenox  Code Status: Full code Family Communication: Discussed with patient Disposition Plan: Patient being transferred to [facility] for surgical intervention as well as dialysis.    Medications: Scheduled:  calcitRIOL   0.25 mcg Oral BID   carvedilol   6.25 mg Oral BID WC   enoxaparin  (LOVENOX ) injection  30 mg Subcutaneous Q24H   gabapentin   300 mg Oral BID   vancomycin  variable dose per unstable renal function (pharmacist dosing)   Does not apply See admin instructions   Continuous:  cefTRIAXone  (ROCEPHIN )  IV 2 g (06/29/24 1050)   PRN:acetaminophen  **OR** acetaminophen , baclofen , ondansetron  **OR** ondansetron  (ZOFRAN ) IV, mouth rinse, oxyCODONE   Antibiotics: Anti-infectives (From admission, onward)    Start     Dose/Rate Route Frequency Ordered Stop   06/29/24 1000   cefTRIAXone  (ROCEPHIN ) 2 g in sodium chloride  0.9 % 100 mL IVPB        2 g 200 mL/hr over 30 Minutes Intravenous Every 24 hours 06/28/24 2301     06/28/24 2133  vancomycin  variable dose per unstable renal function (pharmacist dosing)         Does not apply See admin instructions 06/28/24 2133     06/28/24 2130  vancomycin  (VANCOREADY) IVPB 1750 mg/350 mL        1,750 mg 175 mL/hr over 120 Minutes Intravenous  Once 06/28/24 2128 06/29/24 0013   06/28/24 1915  cefTRIAXone  (ROCEPHIN ) 2 g in sodium chloride  0.9 % 100 mL IVPB        2 g 200 mL/hr over 30 Minutes Intravenous  Once 06/28/24 1906 06/28/24 2005       Objective:  Vital Signs  Vitals:   06/29/24 1815 06/29/24 2056 06/30/24 0040 06/30/24 0430  BP: 120/70 (!) 101/52 90/60 101/62  Pulse: 84 80 83 79  Resp: 18 20 18 18   Temp: 99.9 F (37.7 C) 100.3 F (37.9 C) 99.8 F (37.7 C) 98.5 F (36.9 C)  TempSrc: Oral Oral Oral Oral  SpO2: 90% 93% 100% 96%  Height:       No intake or output data in the 24 hours ending 06/30/24 0902   General appearance: But arousable.  In no distress. Resp: Clear to auscultation bilaterally.  Normal effort Cardio: S1-S2 is normal regular.  No S3-S4.  No rubs murmurs or bruit GI: Abdomen is soft.  Nontender nondistended.  Bowel sounds are present normal.  No masses organomegaly Extremities: Right foot is noted to be swollen.  Covered in dressing.  Foul-smelling discharge noted.  wounds are noted.  Lab Results:  Data Reviewed: I have personally reviewed following labs and reports of the imaging studies  CBC: Recent Labs  Lab 06/28/24 1833 06/29/24 0500 06/30/24 0619  WBC 30.2* 30.9* 21.5*  NEUTROABS 27.4*  --   --   HGB 9.4* 9.8* 8.0*  HCT 31.1* 32.4* 26.1*  MCV 93.7 93.9 93.5  PLT 112* 114* 92*    Basic Metabolic Panel: Recent Labs  Lab 06/28/24 1833 06/29/24 0500 06/30/24 0056 06/30/24 0619  NA 137 137 129* 120*  K 3.1* 3.0* 2.6* >7.5*  CL 97* 93* 91* 96*  CO2 27 30 24  22   GLUCOSE 108* 135* 98 83  BUN 22* 27* 33* 31*  CREATININE 4.71* 5.72* 5.34* 5.43*  CALCIUM  8.2* 9.7 7.9* 7.5*  MG  --   --   --  1.4*  PHOS  --   --   --  2.3*    GFR: Estimated Creatinine Clearance: 16.6 mL/min (A) (by C-G formula based on SCr of 5.43 mg/dL (H)).  Liver Function Tests: Recent Labs  Lab 06/28/24 1833  AST 24  ALT <5  ALKPHOS 165*  BILITOT 1.7*  PROT 6.4*  ALBUMIN  2.4*    Recent Labs  Lab 06/28/24 2036  AMMONIA 21    Coagulation Profile: Recent Labs  Lab 06/29/24 0500  INR 1.8*    Recent Results (from the past 240 hours)  Resp panel by RT-PCR (RSV, Flu A&B, Covid) Anterior Nasal Swab     Status: None   Collection  Time: 06/28/24  6:33 PM   Specimen: Anterior Nasal Swab  Result Value Ref Range Status   SARS Coronavirus 2 by RT PCR NEGATIVE NEGATIVE Final    Comment: (NOTE) SARS-CoV-2 target nucleic acids are NOT DETECTED.  The SARS-CoV-2 RNA is generally detectable in upper respiratory specimens during the acute phase of infection. The lowest concentration of SARS-CoV-2 viral copies this assay can detect is 138 copies/mL. A negative result does not preclude SARS-Cov-2 infection and should not be used as the sole basis for treatment or other patient management decisions. A negative result may occur with  improper specimen collection/handling, submission of specimen other than nasopharyngeal swab, presence of viral mutation(s) within the areas targeted by this assay, and inadequate number of viral copies(<138 copies/mL). A negative result must be combined with clinical observations, patient history, and epidemiological information. The expected result is Negative.  Fact Sheet for Patients:  bloggercourse.com  Fact Sheet for Healthcare Providers:  seriousbroker.it  This test is no t yet approved or cleared by the [place]  FDA and  has been authorized for detection and/or diagnosis of  SARS-CoV-2 by FDA under an Emergency Use Authorization (EUA). This EUA will remain  in effect (meaning this test can be used) for the duration of the COVID-19 declaration under Section 564(b)(1) of the Act, 21 U.S.C.section 360bbb-3(b)(1), unless the authorization is terminated  or revoked sooner.       Influenza A by PCR NEGATIVE NEGATIVE Final   Influenza B by PCR NEGATIVE NEGATIVE Final    Comment: (NOTE) The Xpert Xpress SARS-CoV-2/FLU/RSV plus assay is intended as an aid in the diagnosis of influenza from Nasopharyngeal swab specimens and should not be used as a sole basis for treatment. Nasal washings and aspirates are unacceptable for Xpert Xpress SARS-CoV-2/FLU/RSV testing.  Fact Sheet for Patients: bloggercourse.com  Fact Sheet for Healthcare Providers: seriousbroker.it  This test is not yet approved or cleared by the [place]  FDA and has been authorized for detection and/or diagnosis of SARS-CoV-2 by FDA under an Emergency Use Authorization (EUA). This EUA will remain in effect (meaning this test can be used) for the duration of the COVID-19 declaration under Section 564(b)(1) of the Act, 21 U.S.C. section 360bbb-3(b)(1), unless the authorization is terminated or revoked.     Resp Syncytial Virus by PCR NEGATIVE NEGATIVE Final    Comment: (NOTE) Fact Sheet for Patients: bloggercourse.com  Fact Sheet for Healthcare Providers: seriousbroker.it  This test is not yet approved or cleared by the [place]  FDA and has been authorized for detection and/or diagnosis of SARS-CoV-2 by FDA under an Emergency Use Authorization (EUA). This EUA will remain in effect (meaning this test can be used) for the duration of the COVID-19 declaration under Section 564(b)(1) of the Act, 21 U.S.C. section 360bbb-3(b)(1), unless the authorization is terminated  or revoked.  Performed at [facility], [address] [place], [name] [address]   Urine Culture     Status: Abnormal   Collection Time: 06/28/24  9:33 PM   Specimen: Urine, Random  Result Value Ref Range Status   Specimen Description   Final    URINE, RANDOM Performed at [facility], [address] [place], [name] [address]    Special Requests   Final    NONE Reflexed from [id number] Performed at [facility], [address] [place], [name] [address]    Culture >=100,000 COLONIES/mL ESCHERICHIA COLI (A)  Final   Report Status 06/30/2024 FINAL  Final  Organism ID, Bacteria ESCHERICHIA COLI (A)  Final      Susceptibility   Escherichia coli - MIC*    AMPICILLIN >=32 RESISTANT Resistant     CEFAZOLIN  (URINE) Value in next row Sensitive      8 SENSITIVEThis is a modified FDA-approved test that has been validated and its performance characteristics determined by the reporting laboratory.  This laboratory is certified under the Clinical Laboratory Improvement Amendments CLIA as qualified to perform high complexity clinical laboratory testing.    CEFEPIME  Value in next row Sensitive      8 SENSITIVEThis is a modified FDA-approved test that has been validated and its performance characteristics determined by the reporting laboratory.  This laboratory is certified under the Clinical Laboratory Improvement Amendments CLIA as qualified to perform high complexity clinical laboratory testing.    ERTAPENEM Value in next row Sensitive      8 SENSITIVEThis is a modified FDA-approved test that has been validated and its performance characteristics determined by the reporting laboratory.  This laboratory is certified under the Clinical Laboratory Improvement Amendments CLIA as qualified to perform high complexity clinical laboratory testing.    CEFTRIAXONE  Value in next row Sensitive      8 SENSITIVEThis is a modified FDA-approved test that  has been validated and its performance characteristics determined by the reporting laboratory.  This laboratory is certified under the Clinical Laboratory Improvement Amendments CLIA as qualified to perform high complexity clinical laboratory testing.    CIPROFLOXACIN Value in next row Resistant      8 SENSITIVEThis is a modified FDA-approved test that has been validated and its performance characteristics determined by the reporting laboratory.  This laboratory is certified under the Clinical Laboratory Improvement Amendments CLIA as qualified to perform high complexity clinical laboratory testing.    GENTAMICIN Value in next row Sensitive      8 SENSITIVEThis is a modified FDA-approved test that has been validated and its performance characteristics determined by the reporting laboratory.  This laboratory is certified under the Clinical Laboratory Improvement Amendments CLIA as qualified to perform high complexity clinical laboratory testing.    NITROFURANTOIN Value in next row Sensitive      8 SENSITIVEThis is a modified FDA-approved test that has been validated and its performance characteristics determined by the reporting laboratory.  This laboratory is certified under the Clinical Laboratory Improvement Amendments CLIA as qualified to perform high complexity clinical laboratory testing.    TRIMETH /SULFA  Value in next row Sensitive      8 SENSITIVEThis is a modified FDA-approved test that has been validated and its performance characteristics determined by the reporting laboratory.  This laboratory is certified under the Clinical Laboratory Improvement Amendments CLIA as qualified to perform high complexity clinical laboratory testing.    AMPICILLIN/SULBACTAM Value in next row Resistant      8 SENSITIVEThis is a modified FDA-approved test that has been validated and its performance characteristics determined by the reporting laboratory.  This laboratory is certified under the Clinical Laboratory  Improvement Amendments CLIA as qualified to perform high complexity clinical laboratory testing.    PIP/TAZO Value in next row Sensitive      <=4 SENSITIVEThis is a modified FDA-approved test that has been validated and its performance characteristics determined by the reporting laboratory.  This laboratory is certified under the Clinical Laboratory Improvement Amendments CLIA as qualified to perform high complexity clinical laboratory testing.    MEROPENEM Value in next row Sensitive      <=4 SENSITIVEThis is a modified  FDA-approved test that has been validated and its performance characteristics determined by the reporting laboratory.  This laboratory is certified under the Clinical Laboratory Improvement Amendments CLIA as qualified to perform high complexity clinical laboratory testing.    * >=100,000 COLONIES/mL ESCHERICHIA COLI      Radiology Studies: MR FOOT RIGHT WO CONTRAST Result Date: 06/29/2024 CLINICAL DATA:  Soft tissue infection suspected, foot, xray done ulcer, osteomyelitis EXAM: MRI OF THE RIGHT FOREFOOT WITHOUT CONTRAST TECHNIQUE: Multiplanar, multisequence MR imaging of the right foot was performed. No intravenous contrast was administered. COMPARISON:  Right foot radiographs dated 06/18/2024. FINDINGS: Bones/Joint/Cartilage Redemonstrated destruction with corresponding marrow signal abnormality of the fifth metatarsal head and neck extending into the mid shaft and the base of the fifth proximal phalanx, compatible with osteomyelitis. No convincing marrow signal abnormality identified elsewhere to suggest acute osteomyelitis. Status post amputation of the second toe. Mild degenerative changes of the first MTP joint in the midfoot. No significant joint effusion. No acute fracture or dislocation. Ligaments Lisfranc ligament is intact. Soft tissue/Muscles and Tendons Soft tissue wound at the proximal midfoot demonstrating suspected packing material. There is underlying extensive  subcutaneous soft tissue edema extending along the plantar aspect of the midfoot with foci of susceptibility artifact compatible with soft tissue gas within the subcutaneous fat of the plantar foot extending through the plantar plate into the flexor digitorum brevis muscle (series 7, images 5-13). Extensive surrounding T2 hyperintense signal without discrete loculated collection (series 7, images 9-17). These findings are concerning for gas-forming soft tissue infection with organizing phlegmonous change versus early abscess formation. Additional wounds at the lateral midfoot and forefoot at the level of the mid fifth metatarsal shaft and base of the fifth toe with wound tracking to the level of the destroyed fifth metatarsal distal shaft/head and neck with overlying thin curvilinear fluid collection measuring up to 2.6 cm AP x 0.3 cm TR x 2.2 cm craniocaudal (series 9, images 15-19 and series 7, images 19-23). Diffuse cutaneous thickening and irregularity of the foot. Diffusely increased T2 signal of the intrinsic foot musculature may reflect chronic denervation changes and/or myositis. IMPRESSION: 1. Osteomyelitis with destruction of the distal half of the fifth metatarsal and base of the fifth proximal phalanx. 2. Soft tissue wound at the proximal midfoot demonstrating suspected packing material. There is underlying extensive subcutaneous soft tissue edema extending along the plantar aspect of the midfoot with foci of susceptibility artifact compatible with soft tissue gas within the subcutaneous fat of the plantar foot extending deep through the plantar plate into the flexor digitorum brevis muscle. Extensive surrounding heterogenous fluid signal without discrete loculated collection. These findings are concerning for gas-forming soft tissue infection with organizing phlegmonous change versus early abscess formation. 3. Additional wounds at the lateral midfoot and forefoot at the level of the mid fifth  metatarsal shaft and base of the fifth toe. Wound tracts to the level of the destroyed fifth metatarsal distal shaft/head and neck with overlying thin curvilinear fluid collection measuring up to 2.6 x 2.2 x 0.3 cm. 4. Diffusely increased T2 signal of the intrinsic foot musculature may reflect chronic denervation changes and/or myositis. Electronically Signed   By: Harrietta Sherry M.D.   On: 06/29/2024 11:58   CT ABDOMEN PELVIS WO CONTRAST Result Date: 06/28/2024 EXAM: CT ABDOMEN AND PELVIS WITHOUT CONTRAST 06/28/2024 06:28:43 PM TECHNIQUE: CT of the abdomen and pelvis was performed without the administration of intravenous contrast. Multiplanar reformatted images are provided for review. Automated exposure control, iterative reconstruction, and/or  weight-based adjustment of the mA/kV was utilized to reduce the radiation dose to as low as reasonably achievable. COMPARISON: 02/04/2023 CLINICAL HISTORY: Bowel obstruction suspected. Nausea, vomiting, diarrhea, abdominal pain FINDINGS: LOWER CHEST: Small - moderate right pleural effusion. Cardiomegaly. Interlobular septal thickening and ground glass opacities in the lower lobes suggestive of edema. LIVER: Marked hepatomegaly. Hepatic steatosis. GALLBLADDER AND BILE DUCTS: Sludge in the gallbladder. No evidence of acute cholecystitis. No biliary ductal dilatation. SPLEEN: The spleen is mildly enlarged measuring 14.5 cm in craniocaudal dimension. PANCREAS: No acute abnormality. ADRENAL GLANDS: No acute abnormality. KIDNEYS, URETERS AND BLADDER: No stones in the kidneys or ureters. No hydronephrosis. No perinephric or periureteral stranding. Diffuse bladder wall thickening of the nondistended bladder. Trace adjacent perivesical stranding. GI AND BOWEL: Stomach demonstrates no acute abnormality. There is no bowel obstruction. PERITONEUM AND RETROPERITONEUM: Small volume or abdominal pelvic ascites. No free air. VASCULATURE: Aorta is normal in caliber. Aortic  atherosclerotic calcification. LYMPH NODES: 1.2 cm right iliac chain lymph node on series 2 image 75 has increased from 02/04/2023 when it measured 1.0 cm and 1.5 cm right iliac chain lymph node on series 2 image 60 was not well visualized previously due to ascites. Additional shotty subcentimeter retroperitoneal and iliac chain lymph nodes are similar. Marked enlargement of a right inguinal lymph node on series 2 image 98 measuring 2.4 cm. This is similar to 02/04/2023. Given relative stability since 02/04/2023 these are favored reactive. REPRODUCTIVE ORGANS: No acute abnormality. BONES AND SOFT TISSUES: No acute osseous abnormality. Body wall edema. No focal soft tissue abnormality. IMPRESSION: 1. No evidence of bowel obstruction. 2. Bladder wall thickening and trace perivesical stranding. Correlate for cystitis. 3. Hepatosplenomegaly. Small volume abdominal and pelvic ascites. 4. Small - moderate effusion with pulmonary edema in the lower lungs. Cardiomegaly. Electronically signed by: Norman Gatlin MD 06/28/2024 06:46 PM EST RP Workstation: [id number]       LOS: 2 days   Joette Pebbles  [facility] Pager on [email or web address]  06/30/2024, 9:02 AM   "

## 2024-07-01 ENCOUNTER — Inpatient Hospital Stay (HOSPITAL_COMMUNITY): Admitting: Registered Nurse

## 2024-07-01 ENCOUNTER — Encounter (HOSPITAL_COMMUNITY): Admission: EM | Disposition: A | Payer: Self-pay | Source: Home / Self Care

## 2024-07-01 ENCOUNTER — Encounter (HOSPITAL_COMMUNITY): Payer: Self-pay | Admitting: Podiatry

## 2024-07-01 DIAGNOSIS — I129 Hypertensive chronic kidney disease with stage 1 through stage 4 chronic kidney disease, or unspecified chronic kidney disease: Secondary | ICD-10-CM | POA: Diagnosis not present

## 2024-07-01 DIAGNOSIS — N3001 Acute cystitis with hematuria: Secondary | ICD-10-CM | POA: Diagnosis not present

## 2024-07-01 DIAGNOSIS — E1122 Type 2 diabetes mellitus with diabetic chronic kidney disease: Secondary | ICD-10-CM | POA: Diagnosis not present

## 2024-07-01 DIAGNOSIS — M86171 Other acute osteomyelitis, right ankle and foot: Secondary | ICD-10-CM | POA: Diagnosis not present

## 2024-07-01 DIAGNOSIS — M7989 Other specified soft tissue disorders: Secondary | ICD-10-CM

## 2024-07-01 DIAGNOSIS — M726 Necrotizing fasciitis: Secondary | ICD-10-CM

## 2024-07-01 DIAGNOSIS — N185 Chronic kidney disease, stage 5: Secondary | ICD-10-CM

## 2024-07-01 HISTORY — PX: AMPUTATION: SHX166

## 2024-07-01 LAB — CBC WITH DIFFERENTIAL/PLATELET
Abs Immature Granulocytes: 0.27 K/uL — ABNORMAL HIGH (ref 0.00–0.07)
Basophils Absolute: 0 K/uL (ref 0.0–0.1)
Basophils Relative: 0 %
Eosinophils Absolute: 0.1 K/uL (ref 0.0–0.5)
Eosinophils Relative: 0 %
HCT: 24.3 % — ABNORMAL LOW (ref 39.0–52.0)
Hemoglobin: 7.5 g/dL — ABNORMAL LOW (ref 13.0–17.0)
Immature Granulocytes: 2 %
Lymphocytes Relative: 6 %
Lymphs Abs: 1 K/uL (ref 0.7–4.0)
MCH: 28.8 pg (ref 26.0–34.0)
MCHC: 30.9 g/dL (ref 30.0–36.0)
MCV: 93.5 fL (ref 80.0–100.0)
Monocytes Absolute: 0.9 K/uL (ref 0.1–1.0)
Monocytes Relative: 5 %
Neutro Abs: 14.4 K/uL — ABNORMAL HIGH (ref 1.7–7.7)
Neutrophils Relative %: 87 %
Platelets: 82 K/uL — ABNORMAL LOW (ref 150–400)
RBC: 2.6 MIL/uL — ABNORMAL LOW (ref 4.22–5.81)
RDW: 17.2 % — ABNORMAL HIGH (ref 11.5–15.5)
WBC: 16.6 K/uL — ABNORMAL HIGH (ref 4.0–10.5)
nRBC: 0 % (ref 0.0–0.2)

## 2024-07-01 LAB — GLUCOSE, CAPILLARY
Glucose-Capillary: 100 mg/dL — ABNORMAL HIGH (ref 70–99)
Glucose-Capillary: 103 mg/dL — ABNORMAL HIGH (ref 70–99)
Glucose-Capillary: 101 mg/dL — ABNORMAL HIGH (ref 70–99)
Glucose-Capillary: 87 mg/dL (ref 70–99)

## 2024-07-01 LAB — BASIC METABOLIC PANEL WITH GFR
Anion gap: 18 — ABNORMAL HIGH (ref 5–15)
BUN: 52 mg/dL — ABNORMAL HIGH (ref 6–20)
CO2: 23 mmol/L (ref 22–32)
Calcium: 9.3 mg/dL (ref 8.9–10.3)
Chloride: 93 mmol/L — ABNORMAL LOW (ref 98–111)
Creatinine, Ser: 7.44 mg/dL — ABNORMAL HIGH (ref 0.61–1.24)
GFR, Estimated: 8 mL/min — ABNORMAL LOW
Glucose, Bld: 100 mg/dL — ABNORMAL HIGH (ref 70–99)
Potassium: 3.9 mmol/L (ref 3.5–5.1)
Sodium: 133 mmol/L — ABNORMAL LOW (ref 135–145)

## 2024-07-01 LAB — CBC
HCT: 30.4 % — ABNORMAL LOW (ref 39.0–52.0)
Hemoglobin: 9.3 g/dL — ABNORMAL LOW (ref 13.0–17.0)
MCH: 28.1 pg (ref 26.0–34.0)
MCHC: 30.6 g/dL (ref 30.0–36.0)
MCV: 91.8 fL (ref 80.0–100.0)
Platelets: 93 K/uL — ABNORMAL LOW (ref 150–400)
RBC: 3.31 MIL/uL — ABNORMAL LOW (ref 4.22–5.81)
RDW: 17 % — ABNORMAL HIGH (ref 11.5–15.5)
WBC: 18.4 K/uL — ABNORMAL HIGH (ref 4.0–10.5)
nRBC: 0 % (ref 0.0–0.2)

## 2024-07-01 LAB — PREPARE RBC (CROSSMATCH)

## 2024-07-01 SURGERY — AMPUTATION BELOW KNEE
Anesthesia: Monitor Anesthesia Care | Site: Knee | Laterality: Right

## 2024-07-01 MED ORDER — CHLORHEXIDINE GLUCONATE CLOTH 2 % EX PADS
6.0000 | MEDICATED_PAD | Freq: Every day | CUTANEOUS | Status: AC
  Administered 2024-07-02 – 2024-07-05 (×4): 6 via TOPICAL

## 2024-07-01 MED ORDER — MIDODRINE HCL 5 MG PO TABS: 5.0000 mg | ORAL_TABLET | Freq: Three times a day (TID) | ORAL | Status: DC

## 2024-07-01 MED ORDER — PHENYLEPHRINE 80 MCG/ML (10ML) SYRINGE FOR IV PUSH (FOR BLOOD PRESSURE SUPPORT)
PREFILLED_SYRINGE | INTRAVENOUS | Status: AC
  Filled 2024-07-01: qty 10

## 2024-07-01 MED ORDER — PHENYLEPHRINE 80 MCG/ML (10ML) SYRINGE FOR IV PUSH (FOR BLOOD PRESSURE SUPPORT)
PREFILLED_SYRINGE | INTRAVENOUS | Status: DC | PRN
  Administered 2024-07-01: 160 ug via INTRAVENOUS
  Administered 2024-07-01 (×3): 80 ug via INTRAVENOUS

## 2024-07-01 MED ORDER — LACTATED RINGERS IV SOLN: INTRAVENOUS | Status: DC

## 2024-07-01 MED ORDER — MIDODRINE HCL 5 MG PO TABS
ORAL_TABLET | ORAL | Status: AC
  Filled 2024-07-01: qty 2

## 2024-07-01 MED ORDER — ROCURONIUM BROMIDE 10 MG/ML (PF) SYRINGE
PREFILLED_SYRINGE | INTRAVENOUS | Status: AC
  Filled 2024-07-01: qty 10

## 2024-07-01 MED ORDER — LIDOCAINE 2% (20 MG/ML) 5 ML SYRINGE
INTRAMUSCULAR | Status: AC
  Filled 2024-07-01: qty 5

## 2024-07-01 MED ORDER — FENTANYL CITRATE (PF) 100 MCG/2ML IJ SOLN
INTRAMUSCULAR | Status: AC
  Filled 2024-07-01: qty 2

## 2024-07-01 MED ORDER — TRANEXAMIC ACID-NACL 1000-0.7 MG/100ML-% IV SOLN
1000.0000 mg | Freq: Once | INTRAVENOUS | Status: DC
  Filled 2024-07-01: qty 100

## 2024-07-01 MED ORDER — ONDANSETRON HCL 4 MG/2ML IJ SOLN
INTRAMUSCULAR | Status: DC | PRN
  Administered 2024-07-01: 4 mg via INTRAVENOUS

## 2024-07-01 MED ORDER — PHENYLEPHRINE HCL-NACL 20-0.9 MG/250ML-% IV SOLN
INTRAVENOUS | Status: DC | PRN
  Administered 2024-07-01: 20 ug/min via INTRAVENOUS

## 2024-07-01 MED ORDER — TRANEXAMIC ACID-NACL 1000-0.7 MG/100ML-% IV SOLN
INTRAVENOUS | Status: AC
  Filled 2024-07-01: qty 100

## 2024-07-01 MED ORDER — SODIUM CHLORIDE 0.9 % IV SOLN: INTRAVENOUS | Status: DC

## 2024-07-01 MED ORDER — SODIUM CHLORIDE 0.9 % IV SOLN: INTRAVENOUS | Status: AC | PRN

## 2024-07-01 MED ORDER — ORAL CARE MOUTH RINSE: 15.0000 mL | Freq: Once | OROMUCOSAL | Status: DC

## 2024-07-01 MED ORDER — MUPIROCIN 2 % EX OINT
1.0000 | TOPICAL_OINTMENT | Freq: Two times a day (BID) | CUTANEOUS | Status: AC
  Administered 2024-07-01 – 2024-07-05 (×8): 1 via NASAL
  Filled 2024-07-01: qty 22

## 2024-07-01 MED ORDER — VASOPRESSIN 20 UNIT/ML IV SOLN
INTRAVENOUS | Status: AC
  Filled 2024-07-01: qty 1

## 2024-07-01 MED ORDER — CHLORHEXIDINE GLUCONATE 0.12 % MT SOLN: 15.0000 mL | Freq: Once | OROMUCOSAL | Status: DC

## 2024-07-01 MED ORDER — VANCOMYCIN HCL IN DEXTROSE 1-5 GM/200ML-% IV SOLN
1000.0000 mg | Freq: Once | INTRAVENOUS | Status: AC
  Administered 2024-07-01: 1000 mg via INTRAVENOUS
  Filled 2024-07-01: qty 200

## 2024-07-01 MED ORDER — ACETAMINOPHEN 10 MG/ML IV SOLN
INTRAVENOUS | Status: DC | PRN
  Administered 2024-07-01: 1000 mg via INTRAVENOUS

## 2024-07-01 MED ORDER — PROPOFOL 500 MG/50ML IV EMUL
INTRAVENOUS | Status: DC | PRN
  Administered 2024-07-01: 50 ug/kg/min via INTRAVENOUS

## 2024-07-01 MED ORDER — BUPIVACAINE-EPINEPHRINE (PF) 0.5% -1:200000 IJ SOLN
INTRAMUSCULAR | Status: DC | PRN
  Administered 2024-07-01: 30 mL via PERINEURAL

## 2024-07-01 MED ORDER — ALBUMIN HUMAN 5 % IV SOLN
INTRAVENOUS | Status: AC
  Filled 2024-07-01: qty 250

## 2024-07-01 MED ORDER — ALBUMIN HUMAN 5 % IV SOLN: INTRAVENOUS | Status: DC | PRN

## 2024-07-01 MED ORDER — SODIUM CHLORIDE 0.9% IV SOLUTION: Freq: Once | INTRAVENOUS | Status: DC

## 2024-07-01 MED ORDER — ACETAMINOPHEN 10 MG/ML IV SOLN
INTRAVENOUS | Status: AC
  Filled 2024-07-01: qty 100

## 2024-07-01 MED ORDER — ROPIVACAINE HCL 5 MG/ML IJ SOLN
INTRAMUSCULAR | Status: DC | PRN
  Administered 2024-07-01: 20 mL via PERINEURAL

## 2024-07-01 MED ORDER — CLONIDINE HCL (ANALGESIA) 100 MCG/ML EP SOLN
EPIDURAL | Status: DC | PRN
  Administered 2024-07-01 (×2): 50 ug

## 2024-07-01 MED ORDER — ALBUMIN HUMAN 5 % IV SOLN
12.5000 g | Freq: Once | INTRAVENOUS | Status: AC
  Administered 2024-07-01: 12.5 g via INTRAVENOUS

## 2024-07-01 MED ORDER — ONDANSETRON HCL 4 MG/2ML IJ SOLN
INTRAMUSCULAR | Status: AC
  Filled 2024-07-01: qty 2

## 2024-07-01 MED ORDER — INSULIN ASPART 100 UNIT/ML IJ SOLN: 0.0000 [IU] | INTRAMUSCULAR | Status: DC | PRN

## 2024-07-01 MED ORDER — MIDODRINE HCL 5 MG PO TABS
10.0000 mg | ORAL_TABLET | Freq: Three times a day (TID) | ORAL | Status: DC
  Administered 2024-07-01 – 2024-07-02 (×5): 10 mg via ORAL
  Filled 2024-07-01: qty 2

## 2024-07-01 MED ORDER — FENTANYL CITRATE (PF) 100 MCG/2ML IJ SOLN: 25.0000 ug | INTRAMUSCULAR | Status: DC | PRN

## 2024-07-01 MED ORDER — MIDAZOLAM HCL 2 MG/2ML IJ SOLN
INTRAMUSCULAR | Status: AC
  Filled 2024-07-01: qty 2

## 2024-07-01 MED ORDER — AMISULPRIDE (ANTIEMETIC) 5 MG/2ML IV SOLN: 10.0000 mg | Freq: Once | INTRAVENOUS | Status: DC | PRN

## 2024-07-01 MED ORDER — EPHEDRINE SULFATE-NACL 50-0.9 MG/10ML-% IV SOSY
PREFILLED_SYRINGE | INTRAVENOUS | Status: DC | PRN
  Administered 2024-07-01 (×3): 5 mg via INTRAVENOUS

## 2024-07-01 MED ORDER — GLUCERNA SHAKE PO LIQD
237.0000 mL | Freq: Two times a day (BID) | ORAL | Status: DC
  Administered 2024-07-02 – 2024-07-07 (×10): 237 mL via ORAL

## 2024-07-01 MED ORDER — EPHEDRINE 5 MG/ML INJ
INTRAVENOUS | Status: AC
  Filled 2024-07-01: qty 5

## 2024-07-01 MED ORDER — 0.9 % SODIUM CHLORIDE (POUR BTL) OPTIME
TOPICAL | Status: DC | PRN
  Administered 2024-07-01: 1000 mL

## 2024-07-01 SURGICAL SUPPLY — 34 items
BAG COUNTER SPONGE SURGICOUNT (BAG) IMPLANT
BLADE SAW RECIP 87.9 MT (BLADE) ×1 IMPLANT
BLADE SURG 21 STRL SS (BLADE) ×1 IMPLANT
BNDG COHESIVE 6X5 TAN ST LF (GAUZE/BANDAGES/DRESSINGS) IMPLANT
CANISTER WOUND CARE 500ML ATS (WOUND CARE) ×1 IMPLANT
CANISTER WOUNDNEG PRESSURE 500 (CANNISTER) IMPLANT
COVER SURGICAL LIGHT HANDLE (MISCELLANEOUS) ×1 IMPLANT
CUFF TRNQT CYL 34X4.125X (TOURNIQUET CUFF) ×1 IMPLANT
DRAPE INCISE IOBAN 66X45 STRL (DRAPES) ×1 IMPLANT
DRAPE U-SHAPE 47X51 STRL (DRAPES) ×1 IMPLANT
DRSG VAC PEEL AND PLACE LRG (GAUZE/BANDAGES/DRESSINGS) ×1 IMPLANT
DURAPREP 26ML APPLICATOR (WOUND CARE) ×1 IMPLANT
ELECTRODE REM PT RTRN 9FT ADLT (ELECTROSURGICAL) ×1 IMPLANT
GLOVE BIOGEL PI IND STRL 9 (GLOVE) ×1 IMPLANT
GLOVE BIOGEL PI ORTHO SZ9 (GLOVE) ×1 IMPLANT
GOWN STRL REUS W/ TWL XL LVL3 (GOWN DISPOSABLE) ×2 IMPLANT
KIT BASIN OR (CUSTOM PROCEDURE TRAY) ×1 IMPLANT
KIT DRSG PREVENA PLUS 7DAY 125 (MISCELLANEOUS) IMPLANT
KIT TURNOVER KIT B (KITS) ×1 IMPLANT
MANIFOLD NEPTUNE II (INSTRUMENTS) ×1 IMPLANT
PACK ORTHO EXTREMITY (CUSTOM PROCEDURE TRAY) ×1 IMPLANT
PAD ARMBOARD POSITIONER FOAM (MISCELLANEOUS) ×1 IMPLANT
SOLN 0.9% NACL POUR BTL 1000ML (IV SOLUTION) ×1 IMPLANT
SPONGE T-LAP 18X18 ~~LOC~~+RFID (SPONGE) IMPLANT
STAPLER SKIN PROX 35W (STAPLE) IMPLANT
STOCKINETTE IMPERVIOUS LG (DRAPES) ×1 IMPLANT
SUT ETHILON 2 0 PSLX (SUTURE) IMPLANT
SUT SILK 2-0 18XBRD TIE 12 (SUTURE) ×1 IMPLANT
SUT VIC AB 0 CT1 18XCR BRD8 (SUTURE) IMPLANT
SUT VIC AB 1 CTX 27 (SUTURE) ×2 IMPLANT
SUT VIC AB 2-0 CT2 18 VCP726D (SUTURE) IMPLANT
TOWEL GREEN STERILE (TOWEL DISPOSABLE) ×1 IMPLANT
TUBE CONNECTING 12X1/4 (SUCTIONS) ×1 IMPLANT
YANKAUER SUCT BULB TIP NO VENT (SUCTIONS) ×1 IMPLANT

## 2024-07-01 NOTE — H&P (Signed)
 Orthopedic Surgery H&P Update  Patient's history and physical reviewed - updates include that his mental states has deteriorated since admission. He is now s/p right chopart amputation with Dr. Janit. Risks of surgery were covered again, patient elected to continue with planned procedure Written consent verified - patient not alert enough to make decisions for himself so did telephone consent last night with his brother Site marked Hold anticoagulation in anticipation of spine surgery Vancomycin , ceftriaxone  has been scheduled To OR when ready   Todd Ada, MD Orthopedic Surgeon

## 2024-07-01 NOTE — Anesthesia Postprocedure Evaluation (Signed)
"   Anesthesia Post Note  Patient: Todd Mccoy  Procedure(s) Performed: AMPUTATION BELOW KNEE (Right: Knee)     Patient location during evaluation: PACU Anesthesia Type: MAC and Regional Level of consciousness: awake and alert Pain management: pain level controlled Vital Signs Assessment: post-procedure vital signs reviewed and stable Respiratory status: spontaneous breathing, nonlabored ventilation, respiratory function stable and patient connected to nasal cannula oxygen Cardiovascular status: stable and blood pressure returned to baseline Postop Assessment: no apparent nausea or vomiting Anesthetic complications: no   There were no known notable events for this encounter.  Last Vitals:  Vitals:   07/01/24 1300 07/01/24 1340  BP: 98/67 (!) 89/60  Pulse: 63   Resp:  18  Temp:  36.5 C  SpO2: 100%     Last Pain:  Vitals:   07/01/24 1340  TempSrc: Oral  PainSc:                  Epifanio Lamar BRAVO      "

## 2024-07-01 NOTE — Anesthesia Procedure Notes (Signed)
 Anesthesia Regional Block: Popliteal block   Pre-Anesthetic Checklist: , timeout performed,  Correct Patient, Correct Site, Correct Laterality,  Correct Procedure, Correct Position, site marked,  Risks and benefits discussed,  Surgical consent,  Pre-op  evaluation,  At surgeon's request and post-op pain management  Laterality: Right  Prep: chloraprep       Needles:  Injection technique: Single-shot  Needle Type: Echogenic Needle     Needle Length: 9cm  Needle Gauge: 21     Additional Needles:   Procedures:,,,, ultrasound used (permanent image in chart),,    Narrative:  Start time: 07/01/2024 7:35 AM End time: 07/01/2024 7:41 AM Injection made incrementally with aspirations every 5 mL.  Performed by: Personally  Anesthesiologist: Epifanio Charleston, MD

## 2024-07-01 NOTE — Discharge Instructions (Addendum)
 Orthopedic Surgery Discharge Instructions  Patient name: Todd Mccoy Procedure Performed: right below knee amputation Date of Surgery: 07/01/2024 Surgeon: Ozell Ada, MD  Activity: You should not put any weight through your right below knee amputation site. You can put weight through your left below knee amputation.   Incision Care: Your incision site has a wound vacuum in place over it. This is an incisional vacuum. It may not suck up any drainage and that is okay. Its main purpose in this scenario is to help with wound healing. A secondary benefit is that it will suck up any drainage coming from the wound. This incisional vacuum will run out of power around a week after discharge from the hospital. At that point, please place a dry dressing over the incision and its associated sutures. Keep this dressing in place with an ace wrap wrapped around the residual limb. The limb should remain clean and dry at all times.   You should use lovenox  daily as a blood thinner for the next six weeks. After that, you do not need any additional blood thinners as a result of your surgery.   In order to set expectations for opioid prescriptions, you will only be prescribed opioids for a total of six weeks after surgery and, at two-weeks after surgery, your opioid prescription will start to tapered (decreased dosage and number of pills). If you have ongoing need for opioid medication six weeks after surgery, you will be referred to pain management. If you are already established with a provider that is giving you opioid medications, you should schedule an appointment with them for six weeks after surgery if you feel you are going to need another prescription. State law only allows for opioid prescriptions one week at a time. If you are running out of opioid medication near the end of the week, please call the office during business hours before running out so I can send you another prescription.   Diet: You  are safe to resume your regular diet after surgery.   Reasons to Call the Office After Surgery: You should feel free to call the office with any concerns or questions you have in the post-operative period, but you should definitely notify the office if you develop: -shortness of breath, chest pain, or trouble breathing -excessive bleeding, drainage, redness, or swelling around the surgical site -fevers, chills, or pain that is getting worse with each passing day -persistent nausea or vomiting -new weakness in the right lower extremity, new or worsening numbness or tingling in the right lower extremity -other concerns about your surgery  Follow Up Appointments: You have a follow up appointment scheduled with Dr. Ada on 07/20/2023 at 10:30am. The office location and phone number are listed below. Please arrive on time to your appointment.   Office Information:  -Ozell Ada, MD -Phone number: [phone] -Address: [address]       [place], [name] [address]

## 2024-07-01 NOTE — Op Note (Signed)
 Orthopedic Surgery Operative Report   Procedure: Right transtibial below knee amputation   Modifier: none   Date of procedure: 07/01/2024   Patient name: Todd Mccoy   MRN: [id number]  DOB: August 19, 1970   Surgeon: Ozell Ada, MD Assistant: none Pre-operative diagnosis: right leg necrotizing soft tissue infection Post-operative diagnosis: same as above Findings: healthy appearing tissue edges at closure of the amputation site   Specimens: none Anesthesia: general EBL: 200cc Complications: none Pre-incision antibiotic: patient was on scheduled ceftriaxone  and vancomycin   TXA was given prior to incision   Implants:  None    Indication for procedure: Patient is a 53 y.o. male who had previously been seeing podiatry as an outpatient for diabetic foot ulcers and osteomyelitis. A MRI was planned for further work up. He got admitted to the hospital several days after the MRI was recommended with nausea, vomiting, and general ill feeling. He had an elevated white count. Podiatry was eventually consulted. At this point, there was gas in the soft tissue. 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 Dr. Janit saw the infection further proximally than the amputation site into the hindfoot. He did not feel the foot/ankle was salvageable. The incision site was packed and left open. Dr. Janit called orthopedics in consultation since he felt that a below knee amputation was necessary and was outside of his scope of practice. The patient had gas seen on his x-ray of the foot which tracked to the anterior calcaneus. He had a LINREC score of 12. There still appeared to be nonviable tissue in the base of the wound. His specimen from surgery with Dr. Janit showed polymicrobial infection. Given the concern for necrotizing infection and incomplete surgical treatment of the infection in an unhealthy patient with multiple co-morbidities, I recommended urgent surgery. With the lack of viable  soft tissue coverage to save the foot after his chopart amputation with Dr. Janit, recommended a right below knee amputation. The patient was lacking capacity due to altered mental status so I talked with his brother. I covered the risks, benefits, and alternatives of surgery with him. After this discussion, I answered all his questions to his satisfaction. He elected to proceed with the surgery on behalf of the patient. This consent was witnessed by the charge nurse on 5N. Plans were made for surgery at 730am.   Procedure Description: The patient was met in the pre-operative holding area. The patient's identity and consent were verified. The operative site was marked by myself. A block was performed by anesthesia. The patient was brought back to the operating room. Patient was transferred to the OR table in the supine position. Anesthesia was induced. All bony prominences were well padded. The surgical area was cleansed with alcohol . Patient was on scheduled ceftriaxone  and vancomycin . TXA was administered by anesthesia. The patient's skin was then prepped and draped in a standard, sterile fashion. A time out was performed that identified the patient, the procedure, and the operative site. All team members agreed with what was stated in the time out.    A transverse incision was made over the proximal 1/3 of the leg anteriorly. It was taken about a centimeter posterior to the tibia and 1cm posterior to the fibula. The incision was carried sharply down through skin and dermis and fascia. The incision was then carried longitudinally on both sides for about 10 more centimeters. Then, these longitudinal incisions were connected with a transverse incision posteriorly. These longitudinal and posterior transverse incisions were carried  down sharply through the skin, dermis, and fascia. The anterior transverse incision was carried down sharply with a 21 blade to the level of the tibia and fibula. The anterior tibial  artery was seen with pulsatile bleeding. A hemostat was used to stop the bleeding. Blunt dissection around the artery was performed with a scissors. The artery was tied off with 2 2-0 silk ties. The superficial and deep peroneal nerves were seen and dissected out with a pair of scissors. The nerves were pulled and then cut for a transection neurectomy. A cobb was then used to elevate the soft tissue off of the tibia and fibula. An oscillating saw was used to cut the tibia at a site just proximal to the incision. The oscillating saw was then used to cut the fibula at a site about 1cm proximal to the tibial cut. An amputation knife was then used along the posterior tibia and fibula to dissect the deep and superficial posterior compartments off of the bone. The was carried down to the level of the posterior transverse incision. The 21 blade was then used to cut the remaining soft tissues attached where the distal posterior transverse incision had been made. The amputated leg was then passed off the field to be sent to pathology. The posterior tibial artery was seen to be bleeding at this point. A hemostat was used to stop the bleeding. A scissors was used to dissect out the artery. 2 2-0 silk ties were then used to tie off the artery. The tibial nerve was identified. A traction neurectomy was performed. The deep posterior compartment musculature was debulked to allow for flap closure. There was several small non pulsatile areas with bleeding. Electrocautery was used. Hemostasis was achieved at this point. The oscillating saw was used to bevel the tibia. A rasp was then used to smooth the edges of the tibia. The rasp was then used to smooth the edges of the fibula cut. A 1 vicryl was used to place several krackow sutures into the gastrocnemius/soleus fascia. A second 1 vicryl was used to place further krackow sutures into the gastrocnemius/soleus fascia. A drill was used to drill two holes into the anterior cortex of  the tibia into the intramedullary canal. The vicryl sutures were then passed through the drill holes in the tibia to suture the superficial posterior musculature to the tibia. 0 vicryl suture was then used to suture the gastrocnemius to the anterior compartment fascia and the remaining periosteum over the remaining tibia. The deep dermal layer was reapproximated with 2-0 vicryl. The skin was closed with interrupted 2-0 nylon sutures. An incisional vac was applied over the incision. There was good seal and no evidence of leak. All counts were correct at the end of the case. Patient was transferred back to a hospital bed. The patient was awakened from anesthesia and brought back to the post-anesthesia care unit in stable condition.     Post-operative plan: The patient will recover in the post-anesthesia care unit and then go to the floor on the medicine service. The patient will get another dose of TXA. His antibiotic plan will be determined by the medicine service. The patient will be non weight bearing on the right lower extremity and weight bearing as tolerated on the left lower extremity. The patient will work with physical therapy. The patient's disposition will be determined by the medicine service.        Ozell Ada, MD Orthopedic Surgeon

## 2024-07-01 NOTE — Progress Notes (Signed)
 Pt arrived to unit from PACU,  CCMD called ,CHG given, 1U of RBC infusing at the time of admission . Pt has old scab on left hand picture in chart. Will continue to monitor.   Bell Cai K Jalonda Antigua, RN    07/01/24 1449  Vitals  Temp Source Oral  BP (!) 86/59  MAP (mmHg) 69  BP Location Left Arm  BP Method Automatic  Patient Position (if appropriate) Lying  Pulse Rate (!) 58  Pulse Rate Source Monitor  ECG Heart Rate (!) 59  Resp 12  Level of Consciousness  Level of Consciousness Alert  Oxygen Therapy  SpO2 100 %  O2 Device Room Air  Pain Assessment  Pain Scale 0-10  Pain Score 0  MEWS Score  MEWS Temp 0  MEWS Systolic 1  MEWS Pulse 0  MEWS RR 1  MEWS LOC 0  MEWS Score 2  MEWS Score Color Yellow

## 2024-07-01 NOTE — Plan of Care (Signed)

## 2024-07-01 NOTE — Progress Notes (Signed)
 "  [facility] PROGRESS NOTE   Todd Mccoy FMW:[id number] DOB: Sep 01, 1970 DOA: 06/28/2024  PCP: Delbert Clam, MD  Brief History: 53 y.o. male with medical history significant of CHF, type 2 diabetes, hyperlipidemia who presented to the emergency department due to nausea and vomiting.  Patient has been nauseous over the last several days.  He is currently undergoing treatment for osteomyelitis of his right foot by podiatry.  Due to feeling poorly he presented to the ER.  Patient underwent CT abdomen which showed no evidence of obstruction however cystitis.  Found to have right leg necrotizing soft tissue infection.  Per Dr. Janit infection seemed to track higher than the foot/ankle and he irrigated out this area but did not fully excise the tissue or perform a below knee amputation. Plan for urgent BKA.  Consultants: Podiatry.  Nephrology, orthopedics  Procedures: ABIs    Subjective/Interval History: S/p OR 12/28- issue with low BP post procedure-- given albumin  x 2 and midodrine .  Plan for HD today with PRBC x1   Assessment/Plan:  Cellulitis/abscess right foot/diabetic foot ulcer/osteomyelitis  Symptoms thought to be due to acute infection.  He is followed by podiatry.  Has a previous history of left below-knee amputation.  Has chronic wound in the right foot.  Recent x-ray showed evidence of osteomyelitis.   Patient underwent MRI foot which raises concern for osteomyelitis, abscess and gas.  Went to OR on 12/27 with podiatry but are extended to lower leg-- back to OR On 12/28 for BKA -Continue with broad-spectrum antibiotics with vancomycin  and ceftriaxone . Follow-up on cultures (urine- ecoli)-- no blood cultures done -WBCs trending down  Hypotension -transfer to progressive care -s/p IV albumin  -added midodrine   End-stage renal disease  Plan for HD on 12/28  Acute metabolic encephalopathy Likely due to his acute issues/anesthesia  Normocytic anemia -chronic  with ABLA from surgery -will get 1 unit PRBC with HD  Mild thrombocytopenia Seems to be chronic.  Continue to monitor.  Essential hypertension -all on hold due to hypotension  Coronary artery disease Cardiac status appears to be stable.  Chronic systolic CHF Seems to be stable.  Somewhat hypovolemic. Based on echocardiogram from January 2025 his LVEF is 45 to 50%.  Grade 2 diastolic dysfunction was noted.  Moderate pericardial effusion was noted at that time.  No significant valvular abnormalities. Volume being managed with hemodialysis.  DVT Prophylaxis: Lovenox  Code Status: Full code Disposition Plan:     Medications: Scheduled:  sodium chloride    Intravenous Once   [MAR Hold] calcitRIOL   0.25 mcg Oral BID   chlorhexidine   15 mL Mouth/Throat Once   Or   mouth rinse  15 mL Mouth Rinse Once   [MAR Hold] Chlorhexidine  Gluconate Cloth  6 each Topical Q0600   [MAR Hold] Chlorhexidine  Gluconate Cloth  6 each Topical Daily   [MAR Hold] enoxaparin  (LOVENOX ) injection  30 mg Subcutaneous Q24H   midodrine   10 mg Oral TID WC   [MAR Hold] mupirocin  ointment  1 Application Nasal BID   [MAR Hold] vancomycin  variable dose per unstable renal function (pharmacist dosing)   Does not apply See admin instructions   Continuous:  sodium chloride  10 mL/hr at 07/01/24 0700   [MAR Hold] anticoagulant sodium citrate      [MAR Hold] cefTRIAXone  (ROCEPHIN )  IV 2 g (06/30/24 1057)   PRN:[MAR Hold] acetaminophen  **OR** [MAR Hold] acetaminophen , [MAR Hold] alteplase , amisulpride , [MAR Hold] anticoagulant sodium citrate , fentaNYL  (SUBLIMAZE ) injection, [MAR Hold] heparin , insulin  aspart, [MAR Hold] lidocaine  (PF), [MAR  Hold] lidocaine -prilocaine , [MAR Hold] ondansetron  **OR** [MAR Hold] ondansetron  (ZOFRAN ) IV, [MAR Hold] mouth rinse, [MAR Hold] oxyCODONE , [MAR Hold] pentafluoroprop-tetrafluoroeth    Objective:  Vital Signs  Vitals:   07/01/24 1120 07/01/24 1130 07/01/24 1145 07/01/24 1200  BP:  (!) 75/54 (!) 78/52 (!) 87/57 (!) 89/63  Pulse: (!) 58 (!) 58 (!) 57 (!) 59  Resp:      Temp:      TempSrc:      SpO2: 99% 99% 100% 99%  Weight:      Height:        Intake/Output Summary (Last 24 hours) at 07/01/2024 1223 Last data filed at 07/01/2024 0957 Gross per 24 hour  Intake 850 ml  Output 200 ml  Net 650 ml      General: Appearance:     Overweight male in no acute distress     Lungs:     respirations unlabored  Heart:    Bradycardic.   MS:   Below knee amputation of left lower extremity is noted  Neurologic:   Will awaken     Lab Results:  Data Reviewed: I have personally reviewed following labs and reports of the imaging studies  CBC: Recent Labs  Lab 06/28/24 1833 06/29/24 0500 06/30/24 0619 07/01/24 0613 07/01/24 1104  WBC 30.2* 30.9* 21.5* 18.4* 16.6*  NEUTROABS 27.4*  --   --   --  14.4*  HGB 9.4* 9.8* 8.0* 9.3* 7.5*  HCT 31.1* 32.4* 26.1* 30.4* 24.3*  MCV 93.7 93.9 93.5 91.8 93.5  PLT 112* 114* 92* 93* 82*    Basic Metabolic Panel: Recent Labs  Lab 06/28/24 1833 06/29/24 0500 06/30/24 0056 06/30/24 0619 06/30/24 0912 07/01/24 0613  NA 137 137 129* 120*  --  133*  K 3.1* 3.0* 2.6* >7.5* 4.5 3.9  CL 97* 93* 91* 96*  --  93*  CO2 27 30 24 22   --  23  GLUCOSE 108* 135* 98 83  --  100*  BUN 22* 27* 33* 31*  --  52*  CREATININE 4.71* 5.72* 5.34* 5.43*  --  7.44*  CALCIUM  8.2* 9.7 7.9* 7.5*  --  9.3  MG  --   --   --  1.4*  --   --   PHOS  --   --   --  2.3*  --   --     GFR: Estimated Creatinine Clearance: 12.1 mL/min (A) (by C-G formula based on SCr of 7.44 mg/dL (H)).  Liver Function Tests: Recent Labs  Lab 06/28/24 1833  AST 24  ALT <5  ALKPHOS 165*  BILITOT 1.7*  PROT 6.4*  ALBUMIN  2.4*    Recent Labs  Lab 06/28/24 2036  AMMONIA 21    Coagulation Profile: Recent Labs  Lab 06/29/24 0500  INR 1.8*    Recent Results (from the past 240 hours)  Resp panel by RT-PCR (RSV, Flu A&B, Covid) Anterior Nasal Swab      Status: None   Collection Time: 06/28/24  6:33 PM   Specimen: Anterior Nasal Swab  Result Value Ref Range Status   SARS Coronavirus 2 by RT PCR NEGATIVE NEGATIVE Final    Comment: (NOTE) SARS-CoV-2 target nucleic acids are NOT DETECTED.  The SARS-CoV-2 RNA is generally detectable in upper respiratory specimens during the acute phase of infection. The lowest concentration of SARS-CoV-2 viral copies this assay can detect is 138 copies/mL. A negative result does not preclude SARS-Cov-2 infection and should not be used as the sole basis for  treatment or other patient management decisions. A negative result may occur with  improper specimen collection/handling, submission of specimen other than nasopharyngeal swab, presence of viral mutation(s) within the areas targeted by this assay, and inadequate number of viral copies(<138 copies/mL). A negative result must be combined with clinical observations, patient history, and epidemiological information. The expected result is Negative.  Fact Sheet for Patients:  bloggercourse.com  Fact Sheet for Healthcare Providers:  seriousbroker.it  This test is no t yet approved or cleared by the [place]  FDA and  has been authorized for detection and/or diagnosis of SARS-CoV-2 by FDA under an Emergency Use Authorization (EUA). This EUA will remain  in effect (meaning this test can be used) for the duration of the COVID-19 declaration under Section 564(b)(1) of the Act, 21 U.S.C.section 360bbb-3(b)(1), unless the authorization is terminated  or revoked sooner.       Influenza A by PCR NEGATIVE NEGATIVE Final   Influenza B by PCR NEGATIVE NEGATIVE Final    Comment: (NOTE) The Xpert Xpress SARS-CoV-2/FLU/RSV plus assay is intended as an aid in the diagnosis of influenza from Nasopharyngeal swab specimens and should not be used as a sole basis for treatment. Nasal washings and aspirates are  unacceptable for Xpert Xpress SARS-CoV-2/FLU/RSV testing.  Fact Sheet for Patients: bloggercourse.com  Fact Sheet for Healthcare Providers: seriousbroker.it  This test is not yet approved or cleared by the [place]  FDA and has been authorized for detection and/or diagnosis of SARS-CoV-2 by FDA under an Emergency Use Authorization (EUA). This EUA will remain in effect (meaning this test can be used) for the duration of the COVID-19 declaration under Section 564(b)(1) of the Act, 21 U.S.C. section 360bbb-3(b)(1), unless the authorization is terminated or revoked.     Resp Syncytial Virus by PCR NEGATIVE NEGATIVE Final    Comment: (NOTE) Fact Sheet for Patients: bloggercourse.com  Fact Sheet for Healthcare Providers: seriousbroker.it  This test is not yet approved or cleared by the [place]  FDA and has been authorized for detection and/or diagnosis of SARS-CoV-2 by FDA under an Emergency Use Authorization (EUA). This EUA will remain in effect (meaning this test can be used) for the duration of the COVID-19 declaration under Section 564(b)(1) of the Act, 21 U.S.C. section 360bbb-3(b)(1), unless the authorization is terminated or revoked.  Performed at [facility], [address], [place], [name] [address]   Urine Culture     Status: Abnormal   Collection Time: 06/28/24  9:33 PM   Specimen: Urine, Random  Result Value Ref Range Status   Specimen Description   Final    URINE, RANDOM Performed at [facility], [address], [place], [name] [address]    Special Requests   Final    NONE Reflexed from [phone] Performed at [facility], [address] [place], [name] [address]    Culture >=100,000 COLONIES/mL ESCHERICHIA COLI (A)  Final   Report Status 06/30/2024 FINAL  Final   Organism ID, Bacteria  ESCHERICHIA COLI (A)  Final      Susceptibility   Escherichia coli - MIC*    AMPICILLIN >=32 RESISTANT Resistant     CEFAZOLIN  (URINE) Value in next row Sensitive      8 SENSITIVEThis is a modified FDA-approved test that has been validated and its performance characteristics determined by the reporting laboratory.  This laboratory is certified under the Clinical Laboratory Improvement Amendments CLIA as qualified to perform high complexity clinical laboratory testing.  CEFEPIME  Value in next row Sensitive      8 SENSITIVEThis is a modified FDA-approved test that has been validated and its performance characteristics determined by the reporting laboratory.  This laboratory is certified under the Clinical Laboratory Improvement Amendments CLIA as qualified to perform high complexity clinical laboratory testing.    ERTAPENEM Value in next row Sensitive      8 SENSITIVEThis is a modified FDA-approved test that has been validated and its performance characteristics determined by the reporting laboratory.  This laboratory is certified under the Clinical Laboratory Improvement Amendments CLIA as qualified to perform high complexity clinical laboratory testing.    CEFTRIAXONE  Value in next row Sensitive      8 SENSITIVEThis is a modified FDA-approved test that has been validated and its performance characteristics determined by the reporting laboratory.  This laboratory is certified under the Clinical Laboratory Improvement Amendments CLIA as qualified to perform high complexity clinical laboratory testing.    CIPROFLOXACIN Value in next row Resistant      8 SENSITIVEThis is a modified FDA-approved test that has been validated and its performance characteristics determined by the reporting laboratory.  This laboratory is certified under the Clinical Laboratory Improvement Amendments CLIA as qualified to perform high complexity clinical laboratory testing.    GENTAMICIN Value in next row Sensitive      8  SENSITIVEThis is a modified FDA-approved test that has been validated and its performance characteristics determined by the reporting laboratory.  This laboratory is certified under the Clinical Laboratory Improvement Amendments CLIA as qualified to perform high complexity clinical laboratory testing.    NITROFURANTOIN Value in next row Sensitive      8 SENSITIVEThis is a modified FDA-approved test that has been validated and its performance characteristics determined by the reporting laboratory.  This laboratory is certified under the Clinical Laboratory Improvement Amendments CLIA as qualified to perform high complexity clinical laboratory testing.    TRIMETH /SULFA  Value in next row Sensitive      8 SENSITIVEThis is a modified FDA-approved test that has been validated and its performance characteristics determined by the reporting laboratory.  This laboratory is certified under the Clinical Laboratory Improvement Amendments CLIA as qualified to perform high complexity clinical laboratory testing.    AMPICILLIN/SULBACTAM Value in next row Resistant      8 SENSITIVEThis is a modified FDA-approved test that has been validated and its performance characteristics determined by the reporting laboratory.  This laboratory is certified under the Clinical Laboratory Improvement Amendments CLIA as qualified to perform high complexity clinical laboratory testing.    PIP/TAZO Value in next row Sensitive      <=4 SENSITIVEThis is a modified FDA-approved test that has been validated and its performance characteristics determined by the reporting laboratory.  This laboratory is certified under the Clinical Laboratory Improvement Amendments CLIA as qualified to perform high complexity clinical laboratory testing.    MEROPENEM Value in next row Sensitive      <=4 SENSITIVEThis is a modified FDA-approved test that has been validated and its performance characteristics determined by the reporting laboratory.  This  laboratory is certified under the Clinical Laboratory Improvement Amendments CLIA as qualified to perform high complexity clinical laboratory testing.    * >=100,000 COLONIES/mL ESCHERICHIA COLI  Surgical pcr screen     Status: Abnormal   Collection Time: 06/30/24  1:17 PM   Specimen: Nasal Mucosa; Nasal Swab  Result Value Ref Range Status   MRSA, PCR NEGATIVE NEGATIVE Final   Staphylococcus aureus POSITIVE (  A) NEGATIVE Final    Comment: (NOTE) The Xpert SA Assay (FDA approved for NASAL specimens in patients 71 years of age and older), is one component of a comprehensive surveillance program. It is not intended to diagnose infection nor to guide or monitor treatment. Performed at [facility], [address] [place], [name] [address]   Aerobic/Anaerobic Culture w Gram Stain (surgical/deep wound)     Status: None (Preliminary result)   Collection Time: 06/30/24  1:50 PM   Specimen: Path fluid; Body Fluid  Result Value Ref Range Status   Specimen Description FLUID  Final   Special Requests RIGHT FOOT GANGRENE  Final   Gram Stain   Final    FEW WBC PRESENT, PREDOMINANTLY PMN MODERATE GRAM POSITIVE COCCI FEW GRAM NEGATIVE RODS    Culture   Final    CULTURE REINCUBATED FOR BETTER GROWTH Performed at [facility], [address], [place], [name] [address]    Report Status PENDING  Incomplete      Radiology Studies: DG Tibia/Fibula Right Result Date: 06/30/2024 CLINICAL DATA:  Soft tissue infection. EXAM: RIGHT TIBIA AND FIBULA - 2 VIEW COMPARISON:  None Available. FINDINGS: Bones are markedly demineralized and heterogeneous in appearance. No evidence for an acute fracture. No gross bony destruction. Soft tissue gas noted over the foot. IMPRESSION: 1. Soft tissue gas over the foot. No gross bony destruction. 2. Marked bony demineralization. Electronically Signed   By: Camellia Candle M.D.   On: 06/30/2024 12:58   DG Ankle Complete Right Result Date: 06/30/2024 CLINICAL  DATA:  Diabetic foot ulcer. EXAM: RIGHT ANKLE - COMPLETE 3+ VIEW COMPARISON:  None Available. FINDINGS: Bones are markedly demineralized with a heterogeneous appearance. No acute fracture evident. No gross bony destruction in the ankle region. Soft tissue gas visible in the midfoot region. IMPRESSION: 1. Soft tissue gas in the midfoot region compatible with infection. Necrotizing fasciitis not excluded. 2. No gross bony destruction in the ankle region. 3. Marked bony demineralization with a heterogeneous appearance presumably secondary to disuse osteopenia. Marrow infection not excluded. Electronically Signed   By: Camellia Candle M.D.   On: 06/30/2024 12:56       LOS: 3 days   Harlene RAYMOND Bowl  [facility] Pager on [email or web address]  07/01/2024, 12:23 PM   "

## 2024-07-01 NOTE — Progress Notes (Addendum)
 The patient cut off 40 mins. He was screaming , come on. Get me out of this dirty place. AMA form signed. Dr. Dolan notified. The patient had hypotension. Keep even ,no fluid removed. Midodrine  10 mg po given during HD. BFR 350 ml/min.  07/01/24 1943  Vitals  Temp 97.8 F (36.6 C)  Temp Source Oral  BP 99/66  BP Location Left Arm  BP Method Automatic  Patient Position (if appropriate) Lying  Pulse Rate 64  Resp 16  Weight 86 kg  Type of Weight Post-Dialysis  During Treatment Monitoring  Intra-Hemodialysis Comments See progress note  Post Treatment  Dialyzer Clearance Lightly streaked  Hemodialysis Intake (mL) 0 mL  Liters Processed 50  Fluid Removed (mL) 0 mL  Tolerated HD Treatment No (Comment)  Post-Hemodialysis Comments see notes  AVG/AVF Arterial Site Held (minutes) 7 minutes  AVG/AVF Venous Site Held (minutes) 7 minutes

## 2024-07-01 NOTE — Transfer of Care (Signed)
 Immediate Anesthesia Transfer of Care Note  Patient: Todd Mccoy  Procedure(s) Performed: AMPUTATION BELOW KNEE (Right: Knee)  Patient Location: PACU  Anesthesia Type:MAC and Regional  Level of Consciousness: drowsy and patient cooperative  Airway & Oxygen Therapy: Patient connected to nasal cannula oxygen  Post-op Assessment: Report given to RN and Post -op Vital signs reviewed and stable  Post vital signs: Reviewed and stable  Last Vitals:  Vitals Value Taken Time  BP 95/57 07/01/24 10:05  Temp    Pulse 61 07/01/24 10:08  Resp 8 07/01/24 10:08  SpO2 97 % 07/01/24 10:08  Vitals shown include unfiled device data.  Last Pain:  Vitals:   07/01/24 0648  TempSrc: Oral  PainSc:          Complications: There were no known notable events for this encounter.

## 2024-07-01 NOTE — Brief Op Note (Signed)
 07/01/2024  10:31 AM  PATIENT:  Todd Mccoy  53 y.o. male  PRE-OPERATIVE DIAGNOSIS:  right leg necrotizing soft tissue infection  POST-OPERATIVE DIAGNOSIS:  right leg necrotizing soft tissue infection  PROCEDURE:  Procedures: AMPUTATION BELOW KNEE (Right)  SURGEON:  Surgeons and Role:    DEWAINE Georgina Ozell DELENA, MD - Primary  PHYSICIAN ASSISTANT: none  ASSISTANTS: none   ANESTHESIA:   regional and MAC  EBL:  200 mL   BLOOD ADMINISTERED:none  DRAINS: none   LOCAL MEDICATIONS USED:  NONE  SPECIMEN:  Source of Specimen:  amputated leg sent to pathology  DISPOSITION OF SPECIMEN:  PATHOLOGY  COUNTS:  YES  TOURNIQUET:  NONE  DICTATION: .Note written in EPIC  PLAN OF CARE: Admit to inpatient   PATIENT DISPOSITION:  PACU - hemodynamically stable.   Delay start of Pharmacological VTE agent (>24hrs) due to surgical blood loss or risk of bleeding: no

## 2024-07-01 NOTE — Anesthesia Procedure Notes (Signed)
 Anesthesia Regional Block: Adductor canal block   Pre-Anesthetic Checklist: , timeout performed,  Correct Patient, Correct Site, Correct Laterality,  Correct Procedure, Correct Position, site marked,  Risks and benefits discussed,  Surgical consent,  Pre-op  evaluation,  At surgeon's request and post-op pain management  Laterality: Right  Prep: chloraprep       Needles:  Injection technique: Single-shot  Needle Type: Echogenic Needle     Needle Length: 9cm  Needle Gauge: 21     Additional Needles:   Procedures:,,,, ultrasound used (permanent image in chart),,    Narrative:  Start time: 07/01/2024 7:41 AM End time: 07/01/2024 7:44 AM Injection made incrementally with aspirations every 5 mL.  Performed by: Personally  Anesthesiologist: Epifanio Charleston, MD

## 2024-07-01 NOTE — Progress Notes (Addendum)
 Disregard note.

## 2024-07-01 NOTE — Anesthesia Preprocedure Evaluation (Addendum)
"                                    Anesthesia Evaluation  Patient identified by MRN, date of birth, ID band Patient confused    Reviewed: Allergy & Precautions, H&P , NPO status , Patient's Chart, lab work & pertinent test results, reviewed documented beta blocker date and time   Airway Mallampati: II  TM Distance: >3 FB Neck ROM: Full    Dental no notable dental hx. (+) Teeth Intact, Dental Advisory Given   Pulmonary Current Smoker   Pulmonary exam normal breath sounds clear to auscultation       Cardiovascular hypertension, Pt. on medications and Pt. on home beta blockers + Peripheral Vascular Disease and +CHF   Rhythm:Regular Rate:Normal  2D echo with mildly reduced heart function EF 45-50%(slightly improved from 40-45% on 02/05/23).There is increased stiffness of heart muscle called diastolic dysfunction, enlarged left atrium, moderate pericardial effusion but smaller from echo 02/2023. There is pulmonary HTN that is now mild to moderate (severe prior).   Neuro/Psych   Anxiety     negative neurological ROS     GI/Hepatic negative GI ROS, Neg liver ROS,,,  Endo/Other  diabetes, Insulin  Dependent    Renal/GU ESRF and DialysisRenal disease  negative genitourinary   Musculoskeletal   Abdominal   Peds  Hematology  (+) Blood dyscrasia, anemia   Anesthesia Other Findings   Reproductive/Obstetrics negative OB ROS                              Anesthesia Physical Anesthesia Plan  ASA: 3  Anesthesia Plan: MAC   Post-op Pain Management: Ofirmev  IV (intra-op)* and Regional block*   Induction: Intravenous  PONV Risk Score and Plan: 2 and Ondansetron , Dexamethasone and Propofol  infusion  Airway Management Planned: Simple Face Mask and Natural Airway  Additional Equipment:   Intra-op Plan:   Post-operative Plan:   Informed Consent: I have reviewed the patients History and Physical, chart, labs and discussed the procedure  including the risks, benefits and alternatives for the proposed anesthesia with the patient or authorized representative who has indicated his/her understanding and acceptance.     Dental advisory given and Consent reviewed with POA  Plan Discussed with: CRNA  Anesthesia Plan Comments:          Anesthesia Quick Evaluation  "

## 2024-07-01 NOTE — Progress Notes (Signed)
 [facility] NEPHROLOGY PROGRESS NOTE  Assessment/ Plan: Pt is a 53 y.o. yo male  with past medical history significant for hypertension, hyperlipidemia, diabetes, CHF, was admitted for right foot diabetic ulcer, osteomyelitis seen as a consultation for the management of ESRD.  OP HD orders:  Dialyzes at  [facility], TTS, 4 hr, BFR 450, 2k 2 ca, EDW 85.6 kg, last OP HD on 12/24.RUE AVF.  Mircera 100 mcg on 12/18  # Right foot diabetic ulcer, osteomyelitis: Currently on abx.  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     # ESRD: TTS, last outpatient HD was on 12/24.  He did not get dialysis yesterday probably due to transportation from [place] [facility] to [facility].  Currently blood pressure is low in PACU.  Getting albumin , midodrine  and PRBC.  We will plan to do HD today.  Discussed with the primary team.   # Hypertension: BP low after surgery today.  Got albumin .  Starting midodrine  and transfuse a unit of blood.  Monitor BP.    # Anemia of ESRD: Hemoglobin dropping, received Mircera on 12/18.  Transfuse as needed.   # Metabolic Bone Disease: Currently n.p.o., monitor calcium  phosphorus level.   Subjective: Seen and examined in PACU.  After below-knee amputation surgery.  BP is low.  Somnolent.  Discussed with the PACU nurse and primary team. Objective Vital signs in last 24 hours: Vitals:   07/01/24 1120 07/01/24 1130 07/01/24 1145 07/01/24 1200  BP: (!) 75/54 (!) 78/52 (!) 87/57 (!) 89/63  Pulse: (!) 58 (!) 58 (!) 57 (!) 59  Resp:      Temp:      TempSrc:      SpO2: 99% 99% 100% 99%  Weight:      Height:       Weight change:   Intake/Output Summary (Last 24 hours) at 07/01/2024 1215 Last data filed at 07/01/2024 0957 Gross per 24 hour  Intake 850 ml  Output 200 ml  Net 650 ml       Labs: RENAL PANEL Recent Labs  Lab 06/28/24 1833 06/29/24 0500 06/30/24 0056 06/30/24 0619 06/30/24 0912 07/01/24 0613  NA 137 137 129* 120*  --  133*  K 3.1* 3.0* 2.6* >7.5*  4.5 3.9  CL 97* 93* 91* 96*  --  93*  CO2 27 30 24 22   --  23  GLUCOSE 108* 135* 98 83  --  100*  BUN 22* 27* 33* 31*  --  52*  CREATININE 4.71* 5.72* 5.34* 5.43*  --  7.44*  CALCIUM  8.2* 9.7 7.9* 7.5*  --  9.3  MG  --   --   --  1.4*  --   --   PHOS  --   --   --  2.3*  --   --   ALBUMIN  2.4*  --   --   --   --   --     Liver Function Tests: Recent Labs  Lab 06/28/24 1833  AST 24  ALT <5  ALKPHOS 165*  BILITOT 1.7*  PROT 6.4*  ALBUMIN  2.4*   No results for input(s): LIPASE, AMYLASE in the last 168 hours. Recent Labs  Lab 06/28/24 2036  AMMONIA 21   CBC: Recent Labs    12/21/23 1040 12/23/23 1630 06/28/24 1833 06/29/24 0500 06/30/24 0619 07/01/24 0613 07/01/24 1104  HGB 10.7*   < > 9.4* 9.8* 8.0* 9.3* 7.5*  MCV 89.7   < > 93.7 93.9 93.5 91.8 93.5  VITAMINB12 508  --   --   --   --   --   --   FOLATE 10.4  --   --   --   --   --   --   FERRITIN 383.3*  --   --   --   --   --   --   TIBC 239.4*  --   --   --   --   --   --   IRON  48  --   --   --   --   --   --    < > = values in this interval not displayed.    Cardiac Enzymes: No results for input(s): CKTOTAL, CKMB, CKMBINDEX, TROPONINI in the last 168 hours. CBG: Recent Labs  Lab 06/30/24 1252 06/30/24 1440 07/01/24 0617 07/01/24 1010  GLUCAP 97 101* 100* 103*    Iron  Studies: No results for input(s): IRON , TIBC, TRANSFERRIN, FERRITIN in the last 72 hours. Studies/Results: DG Tibia/Fibula Right Result Date: 06/30/2024 CLINICAL DATA:  Soft tissue infection. EXAM: RIGHT TIBIA AND FIBULA - 2 VIEW COMPARISON:  None Available. FINDINGS: Bones are markedly demineralized and heterogeneous in appearance. No evidence for an acute fracture. No gross bony destruction. Soft tissue gas noted over the foot. IMPRESSION: 1. Soft tissue gas over the foot. No gross bony destruction. 2. Marked bony demineralization. Electronically Signed   By: Camellia Candle M.D.   On: 06/30/2024 12:58   DG Ankle  Complete Right Result Date: 06/30/2024 CLINICAL DATA:  Diabetic foot ulcer. EXAM: RIGHT ANKLE - COMPLETE 3+ VIEW COMPARISON:  None Available. FINDINGS: Bones are markedly demineralized with a heterogeneous appearance. No acute fracture evident. No gross bony destruction in the ankle region. Soft tissue gas visible in the midfoot region. IMPRESSION: 1. Soft tissue gas in the midfoot region compatible with infection. Necrotizing fasciitis not excluded. 2. No gross bony destruction in the ankle region. 3. Marked bony demineralization with a heterogeneous appearance presumably secondary to disuse osteopenia. Marrow infection not excluded. Electronically Signed   By: Camellia Candle M.D.   On: 06/30/2024 12:56    Medications: Infusions:  sodium chloride  10 mL/hr at 07/01/24 0700   [MAR Hold] anticoagulant sodium citrate      [MAR Hold] cefTRIAXone  (ROCEPHIN )  IV 2 g (06/30/24 1057)    Scheduled Medications:  sodium chloride    Intravenous Once   [MAR Hold] calcitRIOL   0.25 mcg Oral BID   chlorhexidine   15 mL Mouth/Throat Once   Or   mouth rinse  15 mL Mouth Rinse Once   [MAR Hold] Chlorhexidine  Gluconate Cloth  6 each Topical Q0600   [MAR Hold] Chlorhexidine  Gluconate Cloth  6 each Topical Daily   [MAR Hold] enoxaparin  (LOVENOX ) injection  30 mg Subcutaneous Q24H   midodrine   10 mg Oral TID WC   [MAR Hold] mupirocin  ointment  1 Application Nasal BID   [MAR Hold] vancomycin  variable dose per unstable renal function (pharmacist dosing)   Does not apply See admin instructions    have reviewed scheduled and prn medications.  Physical Exam: General:NAD, comfortable Heart:RRR, s1s2 nl Lungs:clear b/l, no crackle Abdomen:soft, Non-tender, non-distended Extremities: Bilateral BKA Dialysis Access: Right AV fistula has good thrill and bruit.  Lavinia Mcneely Prasad Baldwin [place] 07/01/2024,12:15 PM  LOS: 3 days

## 2024-07-02 ENCOUNTER — Ambulatory Visit: Admitting: Gastroenterology

## 2024-07-02 ENCOUNTER — Encounter (HOSPITAL_COMMUNITY): Payer: Self-pay | Admitting: Orthopedic Surgery

## 2024-07-02 DIAGNOSIS — N3001 Acute cystitis with hematuria: Secondary | ICD-10-CM | POA: Diagnosis not present

## 2024-07-02 DIAGNOSIS — M86171 Other acute osteomyelitis, right ankle and foot: Secondary | ICD-10-CM | POA: Diagnosis not present

## 2024-07-02 LAB — BPAM RBC
Blood Product Expiration Date: 202601242359
ISSUE DATE / TIME: 202512281335
Unit Type and Rh: 7300

## 2024-07-02 LAB — TYPE AND SCREEN
ABO/RH(D): B POS
Antibody Screen: NEGATIVE
Unit division: 0

## 2024-07-02 LAB — BASIC METABOLIC PANEL WITH GFR
Anion gap: 16 — ABNORMAL HIGH (ref 5–15)
BUN: 36 mg/dL — ABNORMAL HIGH (ref 6–20)
CO2: 23 mmol/L (ref 22–32)
Calcium: 8.8 mg/dL — ABNORMAL LOW (ref 8.9–10.3)
Chloride: 94 mmol/L — ABNORMAL LOW (ref 98–111)
Creatinine, Ser: 5.18 mg/dL — ABNORMAL HIGH (ref 0.61–1.24)
GFR, Estimated: 13 mL/min — ABNORMAL LOW
Glucose, Bld: 94 mg/dL (ref 70–99)
Potassium: 3.8 mmol/L (ref 3.5–5.1)
Sodium: 133 mmol/L — ABNORMAL LOW (ref 135–145)

## 2024-07-02 LAB — CBC
HCT: 26.1 % — ABNORMAL LOW (ref 39.0–52.0)
Hemoglobin: 8.1 g/dL — ABNORMAL LOW (ref 13.0–17.0)
MCH: 27.8 pg (ref 26.0–34.0)
MCHC: 31 g/dL (ref 30.0–36.0)
MCV: 89.7 fL (ref 80.0–100.0)
Platelets: 69 K/uL — ABNORMAL LOW (ref 150–400)
RBC: 2.91 MIL/uL — ABNORMAL LOW (ref 4.22–5.81)
RDW: 18 % — ABNORMAL HIGH (ref 11.5–15.5)
WBC: 11.7 K/uL — ABNORMAL HIGH (ref 4.0–10.5)
nRBC: 0 % (ref 0.0–0.2)

## 2024-07-02 LAB — GLUCOSE, CAPILLARY
Glucose-Capillary: 99 mg/dL (ref 70–99)
Glucose-Capillary: 90 mg/dL (ref 70–99)

## 2024-07-02 LAB — HEPATITIS B SURFACE ANTIBODY, QUANTITATIVE: Hep B S AB Quant (Post): 11 m[IU]/mL

## 2024-07-02 MED ORDER — DARBEPOETIN ALFA 100 MCG/0.5ML IJ SOSY
100.0000 ug | PREFILLED_SYRINGE | INTRAMUSCULAR | Status: DC
  Filled 2024-07-02: qty 0.5

## 2024-07-02 MED ORDER — CHLORHEXIDINE GLUCONATE CLOTH 2 % EX PADS
6.0000 | MEDICATED_PAD | Freq: Every day | CUTANEOUS | Status: DC
  Administered 2024-07-04: 6 via TOPICAL

## 2024-07-02 NOTE — Care Management Important Message (Signed)
 Important Message  Patient Details  Name: Todd Mccoy MRN: [id number] Date of Birth: Nov 12, 1970   Important Message Given:  Yes - Medicare IM     Jennie Laneta Dragon 07/02/2024, 3:41 PM

## 2024-07-02 NOTE — TOC Initial Note (Signed)
 Transition of Care [facility]) - Initial/Assessment Note    Patient Details  Name: Todd Mccoy MRN: [id number] Date of Birth: [birth year]  [facility]) CM/SW Contact:    Todd LOISE Louder, LCSW Phone Number: 07/02/2024, 1:54 PM  Clinical Narrative:                  Received message- patient's son would like a call. CSW spoke with patient's son, Jaylin. CSW introduced self and explained role. He inquired about disposition plan once patient is stable for discharge. He reports patient is in the home with his mother,son and nieces. He states they all work and believes  patient will need more support in the home with his care (bi-lateral amputee). Explained, will update family after recommendations has been made by PT/OT.  CSW encourage family to contact patient's medicaid worker to inquire about potential services he maybe eligible to receive. All questions answered.   TOC will continue to follow and will assist with discharge planning.  Todd Mccoy, MSW, LCSW Clinical Social Worker      Barriers to Discharge: Continued Medical Work up   Patient Goals and CMS Choice            Expected Discharge Plan and Services In-house Referral: Clinical Social Work     Living arrangements for the past 2 months: Single Family Home                                      Prior Living Arrangements/Services Living arrangements for the past 2 months: Single Family Home Lives with:: Self, Adult Children, Parents Patient language and need for interpreter reviewed:: No        Need for Family Participation in Patient Care: Yes (Comment) Care giver support system in place?: Yes (comment)   Criminal Activity/Legal Involvement Pertinent to Current Situation/Hospitalization: No - Comment as needed  Activities of Daily Living   ADL Screening (condition at time of admission) Independently performs ADLs?: No Does the patient have a NEW difficulty with  bathing/dressing/toileting/self-feeding that is expected to last >3 days?: No Does the patient have a NEW difficulty with getting in/out of bed, walking, or climbing stairs that is expected to last >3 days?: No Does the patient have a NEW difficulty with communication that is expected to last >3 days?: No Is the patient deaf or have difficulty hearing?: No Does the patient have difficulty seeing, even when wearing glasses/contacts?: No Does the patient have difficulty concentrating, remembering, or making decisions?: No  Permission Sought/Granted                  Emotional Assessment         Alcohol  / Substance Use: Not Applicable Psych Involvement: No (comment)  Admission diagnosis:  Osteomyelitis (HCC) [M86.9] Acute cystitis with hematuria [N30.01] Other acute osteomyelitis of right foot (HCC) [M86.171] Patient Active Problem List   Diagnosis Date Noted   Necrotizing soft tissue infection 06/30/2024   Osteomyelitis (HCC) 06/28/2024   Elevated alkaline phosphatase level 12/21/2023   Elevated LFTs 12/21/2023   Skin ulcer of right great toe, limited to breakdown of skin (HCC) 10/07/2023   Diabetic polyneuropathy associated with diabetes mellitus due to underlying condition (HCC) 10/05/2023   Other insomnia 10/05/2023   Bilateral carpal tunnel syndrome 10/05/2023   Cubital tunnel syndrome, bilateral 10/05/2023   Chronic pain syndrome 10/05/2023   Encounter for pain management 10/05/2023  Pulmonary HTN (HCC)    Secondary hyperparathyroidism of renal origin 02/24/2023   End stage renal disease (HCC) 02/19/2023   Acquired absence of left leg below knee (HCC) 02/14/2023   Acquired absence of other right toe(s) 02/14/2023   Dependence on renal dialysis 02/14/2023   Hyperlipidemia, unspecified 02/14/2023   Hypertensive heart and chronic kidney disease with heart failure and with stage 5 chronic kidney disease, or end stage renal disease (HCC) 02/14/2023   Nicotine dependence,  cigarettes, uncomplicated 02/14/2023   Type 2 diabetes mellitus with diabetic neuropathy, unspecified (HCC) 02/14/2023   Type 2 diabetes mellitus with diabetic peripheral angiopathy without gangrene (HCC) 02/14/2023   Type 2 diabetes mellitus with unspecified diabetic retinopathy without macular edema (HCC) 02/14/2023   Diarrhea of presumed infectious origin 02/08/2023   Metabolic acidosis 02/08/2023   Anemia of chronic disease 02/08/2023   Acute renal failure superimposed on chronic kidney disease 02/08/2023   Encephalopathy acute 02/07/2023   Shock circulatory (HCC) 02/05/2023   PAD (peripheral artery disease) 10/27/2022   Renal insufficiency    Acute on chronic systolic CHF (congestive heart failure) (HCC) 01/23/2022   Prolonged QT interval 01/23/2022   Chest pain 01/23/2022   Elevated troponin 01/23/2022   Anemia due to chronic kidney disease 01/23/2022   Hypertensive emergency 01/23/2022   Hyponatremia 01/23/2022   Chronic combined systolic (congestive) and diastolic (congestive) heart failure (HCC) 04/07/2021   Chronic renal disease, stage 4, severely decreased glomerular filtration rate between 15-29 mL/min/1.73 square meter (HCC)    Hypertensive urgency    Acute kidney injury superimposed on chronic kidney disease    Acute CHF (congestive heart failure) (HCC) 02/27/2021   Normocytic anemia 02/27/2021   Anasarca 02/27/2021   Abscess of left foot 12/10/2020   S/P BKA (below knee amputation) unilateral, left (HCC) 12/10/2020   Dyslipidemia 12/10/2020   CKD (chronic kidney disease), stage III (HCC) 12/10/2020   HTN (hypertension) 11/12/2020   Cellulitis and abscess of foot    Diabetes mellitus with hyperglycemia (HCC) 09/07/2014   Tobacco abuse 09/07/2014   Abscess of back    Abscess of lower back 09/06/2014   DM2 (diabetes mellitus, type 2) (HCC) 09/06/2014   Sepsis (HCC) 09/06/2014   Scrotal abscess 06/20/2014   PCP:  Delbert Clam, MD Pharmacy:   [facility] [address] [name] [address] Phone: [phone] Fax: [phone]  [facility] - [place], [name] - [address] [name] [address] Phone: [phone] Fax: [phone]  [id number] [place], [name] - [address] AT [facility] [address] [name] [address] Phone: [phone] Fax: [phone]  [id number] - [address] ([place]), [name] - [address] [name] [address] Phone: [phone] Fax: [phone]     Social Drivers of Health (SDOH) Social History: SDOH Screenings   Food Insecurity: Food Insecurity Present (06/30/2024)  Housing: Low Risk (06/30/2024)  Transportation Needs: No Transportation Needs (06/30/2024)  Utilities: Not At Risk (06/30/2024)  Alcohol  Screen: Low Risk (06/26/2024)  Depression (PHQ2-9): Low Risk (06/26/2024)  Financial Resource Strain: Low Risk (06/26/2024)  Physical Activity: Inactive (06/26/2024)  Social Connections: Socially Isolated (06/26/2024)  Stress: No Stress Concern Present (06/26/2024)  Tobacco Use: High Risk (07/01/2024)  Health Literacy: Inadequate Health Literacy (06/26/2024)   SDOH Interventions:     Readmission Risk Interventions     No data to display

## 2024-07-02 NOTE — Progress Notes (Signed)
 Todd Mccoy  Assessment/ Plan: Pt is a 53 y.o. yo male  with past medical history significant for hypertension, hyperlipidemia, diabetes, CHF, was admitted for right foot diabetic ulcer, osteomyelitis seen as a consultation for the management of ESRD.   # Right foot diabetic ulcer, osteomyelitis:  - s/p right BKA today.   - abx per primary team    # ESRD:  - HD per TTS schedule    # Hypotension  - hypotension post-operatively.  Got albumin  and was transfused a unit of PRBC's  - He was also started on midodrine  for hypotension and may be able to stop or wean soon    - Mccoy hx of HTN per charting   # Anemia of ESRD:  - Hemoglobin dropping, received Mircera on 12/18 - due again on 1/1.  Will plan for aranesp  100 mcg every Thursday  - Transfuse as needed.   # Metabolic Bone Disease - on calcitriol   - phos low on last check - repeat in AM  Disposition - continue inpatient monitoring      Subjective:  Last HD on 12/28 with 0 kg UF (was kept even).  He had no uop over 12/28 charted.  Per HD RN Mccoy, he came off of treatment 40 minutes early; he also had hypotension.  Per nurse tech, he has had limited speech today - was often agitated or angry overnight per nurse tech.    Review of systems:   Unable to complete as he answers questions intermittently    Objective Vital signs in last 24 hours: Vitals:   07/02/24 1100 07/02/24 1201 07/02/24 1500 07/02/24 1558  BP: 99/64 91/62 101/64 114/65  Pulse: 66 70 68 70  Resp: 15 20 20 19   Temp:  97.8 F (36.6 C)  98.3 F (36.8 C)  TempSrc:  Axillary  Oral  SpO2: 97% 93% 94% 95%  Weight:      Height:       Weight change: 0 kg  Intake/Output Summary (Last 24 hours) at 07/02/2024 1600 Last data filed at 07/02/2024 9374 Gross per 24 hour  Intake 688.78 ml  Output 0 ml  Net 688.78 ml       Labs: RENAL PANEL Recent Labs  Lab 06/28/24 1833 06/29/24 0500 06/30/24 0056 06/30/24 0619  06/30/24 0912 07/01/24 0613 07/02/24 0336  NA 137 137 129* 120*  --  133* 133*  K 3.1* 3.0* 2.6* >7.5* 4.5 3.9 3.8  CL 97* 93* 91* 96*  --  93* 94*  CO2 27 30 24 22   --  23 23  GLUCOSE 108* 135* 98 83  --  100* 94  BUN 22* 27* 33* 31*  --  52* 36*  CREATININE 4.71* 5.72* 5.34* 5.43*  --  7.44* 5.18*  CALCIUM  8.2* 9.7 7.9* 7.5*  --  9.3 8.8*  MG  --   --   --  1.4*  --   --   --   PHOS  --   --   --  2.3*  --   --   --   ALBUMIN  2.4*  --   --   --   --   --   --     Liver Function Tests: Recent Labs  Lab 06/28/24 1833  AST 24  ALT <5  ALKPHOS 165*  BILITOT 1.7*  PROT 6.4*  ALBUMIN  2.4*   No results for input(s): LIPASE, AMYLASE in the last 168 hours. Recent Labs  Lab 06/28/24 2036  AMMONIA 21   CBC: Recent Labs    12/21/23 1040 12/23/23 1630 06/29/24 0500 06/30/24 0619 07/01/24 0613 07/01/24 1104 07/02/24 0336  HGB 10.7*   < > 9.8* 8.0* 9.3* 7.5* 8.1*  MCV 89.7   < > 93.9 93.5 91.8 93.5 89.7  VITAMINB12 508  --   --   --   --   --   --   FOLATE 10.4  --   --   --   --   --   --   FERRITIN 383.3*  --   --   --   --   --   --   TIBC 239.4*  --   --   --   --   --   --   IRON  48  --   --   --   --   --   --    < > = values in this interval not displayed.    Cardiac Enzymes: No results for input(s): CKTOTAL, CKMB, CKMBINDEX, TROPONINI in the last 168 hours. CBG: Recent Labs  Lab 07/01/24 0617 07/01/24 1010 07/01/24 1542 07/01/24 1954 07/02/24 1203  GLUCAP 100* 103* 101* 87 99    Iron  Studies: No results for input(s): IRON , TIBC, TRANSFERRIN, FERRITIN in the last 72 hours. Studies/Results: No results found.   Medications: Infusions:  sodium chloride  10 mL/hr at 07/01/24 2147   anticoagulant sodium citrate      cefTRIAXone  (ROCEPHIN )  IV 2 g (07/02/24 1041)   tranexamic acid       Scheduled Medications:  sodium chloride    Intravenous Once   calcitRIOL   0.25 mcg Oral BID   Chlorhexidine  Gluconate Cloth  6 each Topical Q0600    Chlorhexidine  Gluconate Cloth  6 each Topical Daily   enoxaparin  (LOVENOX ) injection  30 mg Subcutaneous Q24H   feeding supplement (GLUCERNA SHAKE)  237 mL Oral BID BM   midodrine   10 mg Oral TID WC   mupirocin  ointment  1 Application Nasal BID   vancomycin  variable dose per unstable renal function (pharmacist dosing)   Does not apply See admin instructions    have reviewed scheduled and prn medications.  Physical Exam:   General adult male in bed in no acute distress HEENT normocephalic atraumatic extraocular movements intact sclera anicteric Neck supple trachea midline Lungs clear to auscultation bilaterally normal work of breathing at rest on room air Heart S1S2 no rub Abdomen soft nontender nondistended; obese habitus Extremities bilateral BKA; right leg in wound vac; no pitting edema  Psych blunted affect; no agitation Neuro oriented to person not to year (states 2015) or location  Access RUE AVF with bruit and thrill    Outpatient HD orders:  Dialyzes at  [facility], TTS, 4 hr, BFR 450, 2k 2 ca, EDW 85.6 kg, last OP HD on 12/24.RUE AVF.  Mircera 100 mcg on 12/18   Katheryn JAYSON Mccoy 07/02/2024,4:21 PM  LOS: 4 days

## 2024-07-02 NOTE — Progress Notes (Signed)
 "  [facility] PROGRESS NOTE   Todd Mccoy FMW:[id number] DOB: 11-Jul-1970 DOA: 06/28/2024  PCP: Delbert Clam, MD  Brief History: 53 y.o. male with medical history significant of CHF, type 2 diabetes, hyperlipidemia who presented to the emergency department due to nausea and vomiting.  Patient has been nauseous over the last several days.  He is currently undergoing treatment for osteomyelitis of his right foot by podiatry.  Due to feeling poorly he presented to the ER.  Patient underwent CT abdomen which showed no evidence of obstruction however cystitis.  Found to have right leg necrotizing soft tissue infection.  Per Dr. Janit infection seemed to track higher than the foot/ankle and he irrigated out this area but did not fully excise the tissue or perform a below knee amputation. Plan for urgent BKA.  Consultants: Podiatry.  Nephrology, orthopedics     Subjective/Interval History: Sleepy this a.m., son at bedside-all questions answered   Assessment/Plan:  Cellulitis/abscess right foot/diabetic foot ulcer/osteomyelitis  Symptoms thought to be due to acute infection.  He is followed by podiatry.  Has a previous history of left below-knee amputation.  Has chronic wound in the right foot.  Recent x-ray showed evidence of osteomyelitis.   Patient underwent MRI foot which raises concern for osteomyelitis, abscess and gas.  Went to OR on 12/27 with podiatry but are extended to lower leg-- back to OR On 12/28 for BKA-  Wound vac in place with good suction and no evidence of leak  -Continue with broad-spectrum antibiotics with vancomycin  and ceftriaxone . Follow-up on cultures (urine- ecoli)-- no blood cultures done -WBCs trending down  Hypotension -transfer to progressive care -s/p IV albumin  -added midodrine  -Also gave 1 unit PRBCs  End-stage renal disease  Status post HD on 12/28  Acute metabolic encephalopathy Likely due to his acute issues/anesthesia -PT/OT eval  and monitor  Normocytic anemia -chronic with ABLA from surgery - Status post 1 unit PRBC with HD  Mild thrombocytopenia Seems to be chronic.  Continue to monitor.  Essential hypertension -all on hold due to hypotension  Coronary artery disease Cardiac status appears to be stable.  Chronic systolic CHF Based on echocardiogram from January 2025 his LVEF is 45 to 50%.  Grade 2 diastolic dysfunction was noted.  Moderate pericardial effusion was noted at that time.  No significant valvular abnormalities. Volume being managed with hemodialysis.  DVT Prophylaxis: Lovenox  Code Status: Full code Disposition Plan: PT/OT eval    Medications: Scheduled:  sodium chloride    Intravenous Once   calcitRIOL   0.25 mcg Oral BID   Chlorhexidine  Gluconate Cloth  6 each Topical Q0600   Chlorhexidine  Gluconate Cloth  6 each Topical Daily   enoxaparin  (LOVENOX ) injection  30 mg Subcutaneous Q24H   feeding supplement (GLUCERNA SHAKE)  237 mL Oral BID BM   midodrine   10 mg Oral TID WC   mupirocin  ointment  1 Application Nasal BID   vancomycin  variable dose per unstable renal function (pharmacist dosing)   Does not apply See admin instructions   Continuous:  sodium chloride  10 mL/hr at 07/01/24 2147   anticoagulant sodium citrate      cefTRIAXone  (ROCEPHIN )  IV 2 g (07/02/24 1041)   tranexamic acid      PRN:sodium chloride , acetaminophen  **OR** acetaminophen , alteplase , anticoagulant sodium citrate , heparin , lidocaine  (PF), lidocaine -prilocaine , ondansetron  **OR** ondansetron  (ZOFRAN ) IV, mouth rinse, oxyCODONE , pentafluoroprop-tetrafluoroeth    Objective:  Vital Signs  Vitals:   07/02/24 0900 07/02/24 1000 07/02/24 1100 07/02/24 1201  BP: 102/64 103/70 99/64 91/62  Pulse: 66 69 66 70  Resp: (!) 7 (!) 9 15 20   Temp:    97.8 F (36.6 C)  TempSrc:    Axillary  SpO2: 93% 94% 97% 93%  Weight:      Height:        Intake/Output Summary (Last 24 hours) at 07/02/2024 1241 Last data filed at  07/02/2024 9374 Gross per 24 hour  Intake 888.78 ml  Output 0 ml  Net 888.78 ml     General: Appearance:     Overweight male in no acute distress     Lungs:      respirations unlabored  Heart:    Normal heart rate.  MS: Amputations noted-wound VAC in place  Neurologic: Sleeping but will awaken     Lab Results:  Data Reviewed: I have personally reviewed following labs and reports of the imaging studies  CBC: Recent Labs  Lab 06/28/24 1833 06/29/24 0500 06/30/24 0619 07/01/24 0613 07/01/24 1104 07/02/24 0336  WBC 30.2* 30.9* 21.5* 18.4* 16.6* 11.7*  NEUTROABS 27.4*  --   --   --  14.4*  --   HGB 9.4* 9.8* 8.0* 9.3* 7.5* 8.1*  HCT 31.1* 32.4* 26.1* 30.4* 24.3* 26.1*  MCV 93.7 93.9 93.5 91.8 93.5 89.7  PLT 112* 114* 92* 93* 82* 69*    Basic Metabolic Panel: Recent Labs  Lab 06/29/24 0500 06/30/24 0056 06/30/24 0619 06/30/24 0912 07/01/24 0613 07/02/24 0336  NA 137 129* 120*  --  133* 133*  K 3.0* 2.6* >7.5* 4.5 3.9 3.8  CL 93* 91* 96*  --  93* 94*  CO2 30 24 22   --  23 23  GLUCOSE 135* 98 83  --  100* 94  BUN 27* 33* 31*  --  52* 36*  CREATININE 5.72* 5.34* 5.43*  --  7.44* 5.18*  CALCIUM  9.7 7.9* 7.5*  --  9.3 8.8*  MG  --   --  1.4*  --   --   --   PHOS  --   --  2.3*  --   --   --     GFR: Estimated Creatinine Clearance: 17.4 mL/min (A) (by C-G formula based on SCr of 5.18 mg/dL (H)).  Liver Function Tests: Recent Labs  Lab 06/28/24 1833  AST 24  ALT <5  ALKPHOS 165*  BILITOT 1.7*  PROT 6.4*  ALBUMIN  2.4*    Recent Labs  Lab 06/28/24 2036  AMMONIA 21    Coagulation Profile: Recent Labs  Lab 06/29/24 0500  INR 1.8*    Recent Results (from the past 240 hours)  Resp panel by RT-PCR (RSV, Flu A&B, Covid) Anterior Nasal Swab     Status: None   Collection Time: 06/28/24  6:33 PM   Specimen: Anterior Nasal Swab  Result Value Ref Range Status   SARS Coronavirus 2 by RT PCR NEGATIVE NEGATIVE Final    Comment: (NOTE) SARS-CoV-2 target  nucleic acids are NOT DETECTED.  The SARS-CoV-2 RNA is generally detectable in upper respiratory specimens during the acute phase of infection. The lowest concentration of SARS-CoV-2 viral copies this assay can detect is 138 copies/mL. A negative result does not preclude SARS-Cov-2 infection and should not be used as the sole basis for treatment or other patient management decisions. A negative result may occur with  improper specimen collection/handling, submission of specimen other than nasopharyngeal swab, presence of viral mutation(s) within the areas targeted by this assay, and inadequate number of viral copies(<138 copies/mL). A negative result must  be combined with clinical observations, patient history, and epidemiological information. The expected result is Negative.  Fact Sheet for Patients:  bloggercourse.com  Fact Sheet for Healthcare Providers:  seriousbroker.it  This test is no t yet approved or cleared by the [place]  FDA and  has been authorized for detection and/or diagnosis of SARS-CoV-2 by FDA under an Emergency Use Authorization (EUA). This EUA will remain  in effect (meaning this test can be used) for the duration of the COVID-19 declaration under Section 564(b)(1) of the Act, 21 U.S.C.section 360bbb-3(b)(1), unless the authorization is terminated  or revoked sooner.       Influenza A by PCR NEGATIVE NEGATIVE Final   Influenza B by PCR NEGATIVE NEGATIVE Final    Comment: (NOTE) The Xpert Xpress SARS-CoV-2/FLU/RSV plus assay is intended as an aid in the diagnosis of influenza from Nasopharyngeal swab specimens and should not be used as a sole basis for treatment. Nasal washings and aspirates are unacceptable for Xpert Xpress SARS-CoV-2/FLU/RSV testing.  Fact Sheet for Patients: bloggercourse.com  Fact Sheet for Healthcare  Providers: seriousbroker.it  This test is not yet approved or cleared by the [place]  FDA and has been authorized for detection and/or diagnosis of SARS-CoV-2 by FDA under an Emergency Use Authorization (EUA). This EUA will remain in effect (meaning this test can be used) for the duration of the COVID-19 declaration under Section 564(b)(1) of the Act, 21 U.S.C. section 360bbb-3(b)(1), unless the authorization is terminated or revoked.     Resp Syncytial Virus by PCR NEGATIVE NEGATIVE Final    Comment: (NOTE) Fact Sheet for Patients: bloggercourse.com  Fact Sheet for Healthcare Providers: seriousbroker.it  This test is not yet approved or cleared by the [place]  FDA and has been authorized for detection and/or diagnosis of SARS-CoV-2 by FDA under an Emergency Use Authorization (EUA). This EUA will remain in effect (meaning this test can be used) for the duration of the COVID-19 declaration under Section 564(b)(1) of the Act, 21 U.S.C. section 360bbb-3(b)(1), unless the authorization is terminated or revoked.  Performed at [facility], [address], [place], [name] [address]   Urine Culture     Status: Abnormal   Collection Time: 06/28/24  9:33 PM   Specimen: Urine, Random  Result Value Ref Range Status   Specimen Description   Final    URINE, RANDOM Performed at [facility], [address], [place], [name] [address]    Special Requests   Final    NONE Reflexed from [id number] Performed at [facility], [address] [place], [name] [address]    Culture >=100,000 COLONIES/mL ESCHERICHIA COLI (A)  Final   Report Status 06/30/2024 FINAL  Final   Organism ID, Bacteria ESCHERICHIA COLI (A)  Final      Susceptibility   Escherichia coli - MIC*    AMPICILLIN >=32 RESISTANT Resistant     CEFAZOLIN  (URINE) Value in next row Sensitive       8 SENSITIVEThis is a modified FDA-approved test that has been validated and its performance characteristics determined by the reporting laboratory.  This laboratory is certified under the Clinical Laboratory Improvement Amendments CLIA as qualified to perform high complexity clinical laboratory testing.    CEFEPIME  Value in next row Sensitive      8 SENSITIVEThis is a modified FDA-approved test that has been validated and its performance characteristics determined by the reporting laboratory.  This laboratory is certified under the Clinical Laboratory Improvement Amendments CLIA as qualified  to perform high complexity clinical laboratory testing.    ERTAPENEM Value in next row Sensitive      8 SENSITIVEThis is a modified FDA-approved test that has been validated and its performance characteristics determined by the reporting laboratory.  This laboratory is certified under the Clinical Laboratory Improvement Amendments CLIA as qualified to perform high complexity clinical laboratory testing.    CEFTRIAXONE  Value in next row Sensitive      8 SENSITIVEThis is a modified FDA-approved test that has been validated and its performance characteristics determined by the reporting laboratory.  This laboratory is certified under the Clinical Laboratory Improvement Amendments CLIA as qualified to perform high complexity clinical laboratory testing.    CIPROFLOXACIN Value in next row Resistant      8 SENSITIVEThis is a modified FDA-approved test that has been validated and its performance characteristics determined by the reporting laboratory.  This laboratory is certified under the Clinical Laboratory Improvement Amendments CLIA as qualified to perform high complexity clinical laboratory testing.    GENTAMICIN Value in next row Sensitive      8 SENSITIVEThis is a modified FDA-approved test that has been validated and its performance characteristics determined by the reporting laboratory.  This laboratory is  certified under the Clinical Laboratory Improvement Amendments CLIA as qualified to perform high complexity clinical laboratory testing.    NITROFURANTOIN Value in next row Sensitive      8 SENSITIVEThis is a modified FDA-approved test that has been validated and its performance characteristics determined by the reporting laboratory.  This laboratory is certified under the Clinical Laboratory Improvement Amendments CLIA as qualified to perform high complexity clinical laboratory testing.    TRIMETH /SULFA  Value in next row Sensitive      8 SENSITIVEThis is a modified FDA-approved test that has been validated and its performance characteristics determined by the reporting laboratory.  This laboratory is certified under the Clinical Laboratory Improvement Amendments CLIA as qualified to perform high complexity clinical laboratory testing.    AMPICILLIN/SULBACTAM Value in next row Resistant      8 SENSITIVEThis is a modified FDA-approved test that has been validated and its performance characteristics determined by the reporting laboratory.  This laboratory is certified under the Clinical Laboratory Improvement Amendments CLIA as qualified to perform high complexity clinical laboratory testing.    PIP/TAZO Value in next row Sensitive      <=4 SENSITIVEThis is a modified FDA-approved test that has been validated and its performance characteristics determined by the reporting laboratory.  This laboratory is certified under the Clinical Laboratory Improvement Amendments CLIA as qualified to perform high complexity clinical laboratory testing.    MEROPENEM Value in next row Sensitive      <=4 SENSITIVEThis is a modified FDA-approved test that has been validated and its performance characteristics determined by the reporting laboratory.  This laboratory is certified under the Clinical Laboratory Improvement Amendments CLIA as qualified to perform high complexity clinical laboratory testing.    * >=100,000  COLONIES/mL ESCHERICHIA COLI  Surgical pcr screen     Status: Abnormal   Collection Time: 06/30/24  1:17 PM   Specimen: Nasal Mucosa; Nasal Swab  Result Value Ref Range Status   MRSA, PCR NEGATIVE NEGATIVE Final   Staphylococcus aureus POSITIVE (A) NEGATIVE Final    Comment: (NOTE) The Xpert SA Assay (FDA approved for NASAL specimens in patients 61 years of age and older), is one component of a comprehensive surveillance program. It is not intended to diagnose infection nor to guide or monitor  treatment. Performed at [facility], [address] [place], [name] [address]   Aerobic/Anaerobic Culture w Gram Stain (surgical/deep wound)     Status: None (Preliminary result)   Collection Time: 06/30/24  1:50 PM   Specimen: Path fluid; Body Fluid  Result Value Ref Range Status   Specimen Description FLUID  Final   Special Requests RIGHT FOOT GANGRENE  Final   Gram Stain   Final    FEW WBC PRESENT, PREDOMINANTLY PMN MODERATE GRAM POSITIVE COCCI FEW GRAM NEGATIVE RODS    Culture   Final    MODERATE GRAM NEGATIVE RODS FEW ENTEROCOCCUS FAECALIS SUSCEPTIBILITIES TO FOLLOW Performed at [facility], [address], [place], [name] [address]    Report Status PENDING  Incomplete      Radiology Studies: No results found.      LOS: 4 days   Harlene RAYMOND Bowl  [facility] Pager on [email or web address]  07/02/2024, 12:41 PM   "

## 2024-07-02 NOTE — Progress Notes (Signed)
 Orthopedic Surgery Progress Note   Assessment: Patient is a 53 y.o. male with right leg necrotizing soft tissue infection status post BKA   Plan: -Operative plans: complete -Diet: renal and carb modified -DVT ppx: per primary -Antibiotics: per primary -Weight bearing status: NWB RLE, WBAT LLE -PT/OT evaluate and treat -Pain control -Dispo: per primary  ___________________________________________________________________________  Subjective: No acute events overnight. Block still in effect. Not having any pain in his right leg.    Physical Exam:  General: no acute distress, laying in bed Neurologic: sleeping but awakes to voice, mentation improved, answering questions appropriately Respiratory: unlabored breathing  MSK:   -Right lower extremity  Wound vac in place with good suction and no evidence of leak Fires knee extensors and flexors Residual limb warm and well perfused   Yesterday's total administered Morphine  Milligram Equivalents: 15   Patient name: DARIOUS REHMAN Patient MRN: [id number] Date: 07/02/2024

## 2024-07-03 ENCOUNTER — Encounter (HOSPITAL_BASED_OUTPATIENT_CLINIC_OR_DEPARTMENT_OTHER): Admitting: General Surgery

## 2024-07-03 DIAGNOSIS — N3001 Acute cystitis with hematuria: Secondary | ICD-10-CM | POA: Diagnosis not present

## 2024-07-03 DIAGNOSIS — M86171 Other acute osteomyelitis, right ankle and foot: Secondary | ICD-10-CM | POA: Diagnosis not present

## 2024-07-03 LAB — BASIC METABOLIC PANEL WITH GFR
Anion gap: 17 — ABNORMAL HIGH (ref 5–15)
BUN: 45 mg/dL — ABNORMAL HIGH (ref 6–20)
CO2: 24 mmol/L (ref 22–32)
Calcium: 9.4 mg/dL (ref 8.9–10.3)
Chloride: 92 mmol/L — ABNORMAL LOW (ref 98–111)
Creatinine, Ser: 6.12 mg/dL — ABNORMAL HIGH (ref 0.61–1.24)
GFR, Estimated: 10 mL/min — ABNORMAL LOW
Glucose, Bld: 102 mg/dL — ABNORMAL HIGH (ref 70–99)
Potassium: 3.9 mmol/L (ref 3.5–5.1)
Sodium: 133 mmol/L — ABNORMAL LOW (ref 135–145)

## 2024-07-03 LAB — CBC
HCT: 27.9 % — ABNORMAL LOW (ref 39.0–52.0)
Hemoglobin: 8.7 g/dL — ABNORMAL LOW (ref 13.0–17.0)
MCH: 28.3 pg (ref 26.0–34.0)
MCHC: 31.2 g/dL (ref 30.0–36.0)
MCV: 90.9 fL (ref 80.0–100.0)
Platelets: 88 K/uL — ABNORMAL LOW (ref 150–400)
RBC: 3.07 MIL/uL — ABNORMAL LOW (ref 4.22–5.81)
RDW: 17.8 % — ABNORMAL HIGH (ref 11.5–15.5)
WBC: 12.7 K/uL — ABNORMAL HIGH (ref 4.0–10.5)
nRBC: 0 % (ref 0.0–0.2)

## 2024-07-03 LAB — PHOSPHORUS: Phosphorus: 4.4 mg/dL (ref 2.5–4.6)

## 2024-07-03 MED ORDER — MIDODRINE HCL 5 MG PO TABS
5.0000 mg | ORAL_TABLET | Freq: Three times a day (TID) | ORAL | Status: DC
  Administered 2024-07-03 – 2024-07-04 (×4): 5 mg via ORAL
  Filled 2024-07-03 (×4): qty 1

## 2024-07-03 MED ORDER — LINEZOLID 600 MG/300ML IV SOLN
600.0000 mg | Freq: Two times a day (BID) | INTRAVENOUS | Status: DC
  Administered 2024-07-03 – 2024-07-04 (×2): 600 mg via INTRAVENOUS
  Filled 2024-07-03 (×3): qty 300

## 2024-07-03 NOTE — Progress Notes (Signed)
 "  [facility] PROGRESS NOTE   Todd Mccoy FMW:[id number] DOB: 1970/11/29 DOA: 06/28/2024  PCP: Delbert Clam, MD  Brief History: 53 y.o. male with medical history significant of CHF, type 2 diabetes, hyperlipidemia who presented to the emergency department due to nausea and vomiting.  Patient has been nauseous over the last several days.  He is currently undergoing treatment for osteomyelitis of his right foot by podiatry.  Due to feeling poorly he presented to the ER.  Patient underwent CT abdomen which showed no evidence of obstruction however cystitis.  Found to have right leg necrotizing soft tissue infection.  Per Dr. Janit infection seemed to track higher than the foot/ankle and he irrigated out this area but did not fully excise the tissue or perform a below knee amputation. S/p BKA.  Consultants: Podiatry.  Nephrology, orthopedics     Subjective/Interval History: In HD, will wake up and answer questions but then will stop and ignore   Assessment/Plan:  Cellulitis/abscess right foot/diabetic foot ulcer/osteomyelitis  Symptoms thought to be due to acute infection.  He is followed by podiatry.  Has a previous history of left below-knee amputation.  Has chronic wound in the right foot.  Recent x-ray showed evidence of osteomyelitis.   Patient underwent MRI foot which raises concern for osteomyelitis, abscess and gas.  Went to OR on 12/27 with podiatry but area of infection extended to lower leg-- back to OR On 12/28 for BKA-  Wound vac in place with good suction and no evidence of leak  -Continue with broad-spectrum antibiotics based on cultures Follow-up on cultures (urine- ecoli)-- no blood cultures done -WBCs trending down -Operative culture shows Enterococcus and E. coli  Hypotension -transfer to progressive care -s/p IV albumin  -added midodrine  -Also gave 1 unit PRBCs  End-stage renal disease  Status post HD on 12/28  Acute metabolic  encephalopathy Likely due to his acute issues/anesthesia-if continues to have issues in the a.m. after dialysis would get head CT -PT/OT eval and monitor  Normocytic anemia -chronic with ABLA from surgery - Status post 1 unit PRBC with HD  Mild thrombocytopenia Seems to be chronic.  Continue to monitor.  Essential hypertension -all on hold due to hypotension  Coronary artery disease Cardiac status appears to be stable.  Chronic systolic CHF Based on echocardiogram from January 2025 his LVEF is 45 to 50%.  Grade 2 diastolic dysfunction was noted.  Moderate pericardial effusion was noted at that time.  No significant valvular abnormalities. Volume being managed with hemodialysis.  DVT Prophylaxis: Lovenox  Code Status: Full code Disposition Plan: PT/OT eval    Medications: Scheduled:  sodium chloride    Intravenous Once   calcitRIOL   0.25 mcg Oral BID   Chlorhexidine  Gluconate Cloth  6 each Topical Q0600   Chlorhexidine  Gluconate Cloth  6 each Topical Daily   Chlorhexidine  Gluconate Cloth  6 each Topical Q0600   [START ON 07/05/2024] darbepoetin (ARANESP ) injection - DIALYSIS  100 mcg Subcutaneous Q Thu-1800   enoxaparin  (LOVENOX ) injection  30 mg Subcutaneous Q24H   feeding supplement (GLUCERNA SHAKE)  237 mL Oral BID BM   midodrine   5 mg Oral TID WC   mupirocin  ointment  1 Application Nasal BID   Continuous:  anticoagulant sodium citrate      cefTRIAXone  (ROCEPHIN )  IV 2 g (07/02/24 1041)   linezolid  (ZYVOX ) IV     tranexamic acid      PRN:acetaminophen  **OR** acetaminophen , alteplase , anticoagulant sodium citrate , heparin , lidocaine  (PF), lidocaine -prilocaine , ondansetron  **OR** ondansetron  (ZOFRAN ) IV, mouth  rinse, oxyCODONE , pentafluoroprop-tetrafluoroeth    Objective:  Vital Signs  Vitals:   07/03/24 1030 07/03/24 1100 07/03/24 1130 07/03/24 1200  BP: 122/72 135/76 124/76 132/75  Pulse: 70 73 73 74  Resp: (!) 0 (!) 0 (!) 0 14  Temp:      TempSrc:      SpO2:       Weight:      Height:        Intake/Output Summary (Last 24 hours) at 07/03/2024 1215 Last data filed at 07/03/2024 0650 Gross per 24 hour  Intake 240 ml  Output 51 ml  Net 189 ml     General: Appearance:     Overweight male in no acute distress     Lungs:      respirations unlabored  Heart:    Normal heart rate.  MS: Amputations noted-wound VAC in place  Neurologic: Sleeping but will awaken     Lab Results:  Data Reviewed: I have personally reviewed following labs and reports of the imaging studies  CBC: Recent Labs  Lab 06/28/24 1833 06/29/24 0500 06/30/24 0619 07/01/24 0613 07/01/24 1104 07/02/24 0336 07/03/24 0503  WBC 30.2*   < > 21.5* 18.4* 16.6* 11.7* 12.7*  NEUTROABS 27.4*  --   --   --  14.4*  --   --   HGB 9.4*   < > 8.0* 9.3* 7.5* 8.1* 8.7*  HCT 31.1*   < > 26.1* 30.4* 24.3* 26.1* 27.9*  MCV 93.7   < > 93.5 91.8 93.5 89.7 90.9  PLT 112*   < > 92* 93* 82* 69* 88*   < > = values in this interval not displayed.    Basic Metabolic Panel: Recent Labs  Lab 06/30/24 0056 06/30/24 0619 06/30/24 0912 07/01/24 0613 07/02/24 0336 07/03/24 0503  NA 129* 120*  --  133* 133* 133*  K 2.6* >7.5* 4.5 3.9 3.8 3.9  CL 91* 96*  --  93* 94* 92*  CO2 24 22  --  23 23 24   GLUCOSE 98 83  --  100* 94 102*  BUN 33* 31*  --  52* 36* 45*  CREATININE 5.34* 5.43*  --  7.44* 5.18* 6.12*  CALCIUM  7.9* 7.5*  --  9.3 8.8* 9.4  MG  --  1.4*  --   --   --   --   PHOS  --  2.3*  --   --   --  4.4    GFR: Estimated Creatinine Clearance: 16.1 mL/min (A) (by C-G formula based on SCr of 6.12 mg/dL (H)).  Liver Function Tests: Recent Labs  Lab 06/28/24 1833  AST 24  ALT <5  ALKPHOS 165*  BILITOT 1.7*  PROT 6.4*  ALBUMIN  2.4*    Recent Labs  Lab 06/28/24 2036  AMMONIA 21    Coagulation Profile: Recent Labs  Lab 06/29/24 0500  INR 1.8*    Recent Results (from the past 240 hours)  Resp panel by RT-PCR (RSV, Flu A&B, Covid) Anterior Nasal Swab      Status: None   Collection Time: 06/28/24  6:33 PM   Specimen: Anterior Nasal Swab  Result Value Ref Range Status   SARS Coronavirus 2 by RT PCR NEGATIVE NEGATIVE Final    Comment: (NOTE) SARS-CoV-2 target nucleic acids are NOT DETECTED.  The SARS-CoV-2 RNA is generally detectable in upper respiratory specimens during the acute phase of infection. The lowest concentration of SARS-CoV-2 viral copies this assay can detect is 138 copies/mL. A negative  result does not preclude SARS-Cov-2 infection and should not be used as the sole basis for treatment or other patient management decisions. A negative result may occur with  improper specimen collection/handling, submission of specimen other than nasopharyngeal swab, presence of viral mutation(s) within the areas targeted by this assay, and inadequate number of viral copies(<138 copies/mL). A negative result must be combined with clinical observations, patient history, and epidemiological information. The expected result is Negative.  Fact Sheet for Patients:  bloggercourse.com  Fact Sheet for Healthcare Providers:  seriousbroker.it  This test is no t yet approved or cleared by the [place]  FDA and  has been authorized for detection and/or diagnosis of SARS-CoV-2 by FDA under an Emergency Use Authorization (EUA). This EUA will remain  in effect (meaning this test can be used) for the duration of the COVID-19 declaration under Section 564(b)(1) of the Act, 21 U.S.C.section 360bbb-3(b)(1), unless the authorization is terminated  or revoked sooner.       Influenza A by PCR NEGATIVE NEGATIVE Final   Influenza B by PCR NEGATIVE NEGATIVE Final    Comment: (NOTE) The Xpert Xpress SARS-CoV-2/FLU/RSV plus assay is intended as an aid in the diagnosis of influenza from Nasopharyngeal swab specimens and should not be used as a sole basis for treatment. Nasal washings and aspirates are  unacceptable for Xpert Xpress SARS-CoV-2/FLU/RSV testing.  Fact Sheet for Patients: bloggercourse.com  Fact Sheet for Healthcare Providers: seriousbroker.it  This test is not yet approved or cleared by the [place]  FDA and has been authorized for detection and/or diagnosis of SARS-CoV-2 by FDA under an Emergency Use Authorization (EUA). This EUA will remain in effect (meaning this test can be used) for the duration of the COVID-19 declaration under Section 564(b)(1) of the Act, 21 U.S.C. section 360bbb-3(b)(1), unless the authorization is terminated or revoked.     Resp Syncytial Virus by PCR NEGATIVE NEGATIVE Final    Comment: (NOTE) Fact Sheet for Patients: bloggercourse.com  Fact Sheet for Healthcare Providers: seriousbroker.it  This test is not yet approved or cleared by the [place]  FDA and has been authorized for detection and/or diagnosis of SARS-CoV-2 by FDA under an Emergency Use Authorization (EUA). This EUA will remain in effect (meaning this test can be used) for the duration of the COVID-19 declaration under Section 564(b)(1) of the Act, 21 U.S.C. section 360bbb-3(b)(1), unless the authorization is terminated or revoked.  Performed at [facility], [address], [place], [name] [address]   Urine Culture     Status: Abnormal   Collection Time: 06/28/24  9:33 PM   Specimen: Urine, Random  Result Value Ref Range Status   Specimen Description   Final    URINE, RANDOM Performed at [facility], [address] [place], [name] [address]    Special Requests   Final    NONE Reflexed from [id number] Performed at [facility], [address], [place], [name] [address]    Culture >=100,000 COLONIES/mL ESCHERICHIA COLI (A)  Final   Report Status 06/30/2024 FINAL  Final   Organism ID, Bacteria  ESCHERICHIA COLI (A)  Final      Susceptibility   Escherichia coli - MIC*    AMPICILLIN >=32 RESISTANT Resistant     CEFAZOLIN  (URINE) Value in next row Sensitive      8 SENSITIVEThis is a modified FDA-approved test that has been validated and its performance characteristics determined by the reporting laboratory.  This laboratory is certified under the Clinical  Laboratory Improvement Amendments CLIA as qualified to perform high complexity clinical laboratory testing.    CEFEPIME  Value in next row Sensitive      8 SENSITIVEThis is a modified FDA-approved test that has been validated and its performance characteristics determined by the reporting laboratory.  This laboratory is certified under the Clinical Laboratory Improvement Amendments CLIA as qualified to perform high complexity clinical laboratory testing.    ERTAPENEM Value in next row Sensitive      8 SENSITIVEThis is a modified FDA-approved test that has been validated and its performance characteristics determined by the reporting laboratory.  This laboratory is certified under the Clinical Laboratory Improvement Amendments CLIA as qualified to perform high complexity clinical laboratory testing.    CEFTRIAXONE  Value in next row Sensitive      8 SENSITIVEThis is a modified FDA-approved test that has been validated and its performance characteristics determined by the reporting laboratory.  This laboratory is certified under the Clinical Laboratory Improvement Amendments CLIA as qualified to perform high complexity clinical laboratory testing.    CIPROFLOXACIN Value in next row Resistant      8 SENSITIVEThis is a modified FDA-approved test that has been validated and its performance characteristics determined by the reporting laboratory.  This laboratory is certified under the Clinical Laboratory Improvement Amendments CLIA as qualified to perform high complexity clinical laboratory testing.    GENTAMICIN Value in next row Sensitive      8  SENSITIVEThis is a modified FDA-approved test that has been validated and its performance characteristics determined by the reporting laboratory.  This laboratory is certified under the Clinical Laboratory Improvement Amendments CLIA as qualified to perform high complexity clinical laboratory testing.    NITROFURANTOIN Value in next row Sensitive      8 SENSITIVEThis is a modified FDA-approved test that has been validated and its performance characteristics determined by the reporting laboratory.  This laboratory is certified under the Clinical Laboratory Improvement Amendments CLIA as qualified to perform high complexity clinical laboratory testing.    TRIMETH /SULFA  Value in next row Sensitive      8 SENSITIVEThis is a modified FDA-approved test that has been validated and its performance characteristics determined by the reporting laboratory.  This laboratory is certified under the Clinical Laboratory Improvement Amendments CLIA as qualified to perform high complexity clinical laboratory testing.    AMPICILLIN/SULBACTAM Value in next row Resistant      8 SENSITIVEThis is a modified FDA-approved test that has been validated and its performance characteristics determined by the reporting laboratory.  This laboratory is certified under the Clinical Laboratory Improvement Amendments CLIA as qualified to perform high complexity clinical laboratory testing.    PIP/TAZO Value in next row Sensitive      <=4 SENSITIVEThis is a modified FDA-approved test that has been validated and its performance characteristics determined by the reporting laboratory.  This laboratory is certified under the Clinical Laboratory Improvement Amendments CLIA as qualified to perform high complexity clinical laboratory testing.    MEROPENEM Value in next row Sensitive      <=4 SENSITIVEThis is a modified FDA-approved test that has been validated and its performance characteristics determined by the reporting laboratory.  This  laboratory is certified under the Clinical Laboratory Improvement Amendments CLIA as qualified to perform high complexity clinical laboratory testing.    * >=100,000 COLONIES/mL ESCHERICHIA COLI  Surgical pcr screen     Status: Abnormal   Collection Time: 06/30/24  1:17 PM   Specimen: Nasal Mucosa; Nasal Swab  Result  Value Ref Range Status   MRSA, PCR NEGATIVE NEGATIVE Final   Staphylococcus aureus POSITIVE (A) NEGATIVE Final    Comment: (NOTE) The Xpert SA Assay (FDA approved for NASAL specimens in patients 44 years of age and older), is one component of a comprehensive surveillance program. It is not intended to diagnose infection nor to guide or monitor treatment. Performed at [facility], [address], [place], [name] [address]   Aerobic/Anaerobic Culture w Gram Stain (surgical/deep wound)     Status: None (Preliminary result)   Collection Time: 06/30/24  1:50 PM   Specimen: Path fluid; Body Fluid  Result Value Ref Range Status   Specimen Description FLUID  Final   Special Requests RIGHT FOOT GANGRENE  Final   Gram Stain   Final    FEW WBC PRESENT, PREDOMINANTLY PMN MODERATE GRAM POSITIVE COCCI FEW GRAM NEGATIVE RODS Performed at [facility], [address] [place], [name] [address]    Culture   Final    MODERATE ESCHERICHIA COLI FEW ENTEROCOCCUS FAECALIS NO ANAEROBES ISOLATED; CULTURE IN PROGRESS FOR 5 DAYS    Report Status PENDING  Incomplete   Organism ID, Bacteria ESCHERICHIA COLI  Final   Organism ID, Bacteria ENTEROCOCCUS FAECALIS  Final      Susceptibility   Escherichia coli - MIC*    AMPICILLIN >=32 RESISTANT Resistant     CEFAZOLIN  (NON-URINE) 8 RESISTANT Resistant     CEFEPIME  <=0.12 SENSITIVE Sensitive     ERTAPENEM <=0.12 SENSITIVE Sensitive     CEFTRIAXONE  <=0.25 SENSITIVE Sensitive     CIPROFLOXACIN >=4 RESISTANT Resistant     GENTAMICIN <=1 SENSITIVE Sensitive     MEROPENEM <=0.25 SENSITIVE Sensitive     TRIMETH /SULFA  <=20 SENSITIVE  Sensitive     AMPICILLIN/SULBACTAM >=32 RESISTANT Resistant     PIP/TAZO Value in next row Sensitive      <=4 SENSITIVEThis is a modified FDA-approved test that has been validated and its performance characteristics determined by the reporting laboratory.  This laboratory is certified under the Clinical Laboratory Improvement Amendments CLIA as qualified to perform high complexity clinical laboratory testing.    * MODERATE ESCHERICHIA COLI   Enterococcus faecalis - MIC*    AMPICILLIN Value in next row Sensitive      <=4 SENSITIVEThis is a modified FDA-approved test that has been validated and its performance characteristics determined by the reporting laboratory.  This laboratory is certified under the Clinical Laboratory Improvement Amendments CLIA as qualified to perform high complexity clinical laboratory testing.    VANCOMYCIN  Value in next row Sensitive      <=4 SENSITIVEThis is a modified FDA-approved test that has been validated and its performance characteristics determined by the reporting laboratory.  This laboratory is certified under the Clinical Laboratory Improvement Amendments CLIA as qualified to perform high complexity clinical laboratory testing.    GENTAMICIN SYNERGY Value in next row Resistant      <=4 SENSITIVEThis is a modified FDA-approved test that has been validated and its performance characteristics determined by the reporting laboratory.  This laboratory is certified under the Clinical Laboratory Improvement Amendments CLIA as qualified to perform high complexity clinical laboratory testing.    * FEW ENTEROCOCCUS FAECALIS      Radiology Studies: No results found.      LOS: 5 days   Harlene RAYMOND Bowl  [facility] Pager on [email or web address]  07/03/2024, 12:15 PM   "

## 2024-07-03 NOTE — Progress Notes (Signed)
 PT Cancellation Note  Patient Details Name: NABEEL GLADSON MRN: [id number] DOB: [birth year]   Cancelled Treatment:    Reason Eval/Treat Not Completed: (P) Patient at procedure or test/unavailable Pt remains off floor for HD. PT will follow back tomorrow for Evaluation.  Ichael Pullara B. Fleeta Lapidus PT, DPT Acute Rehabilitation Services Please use secure chat or  Call Office [phone]    [name] [facility] 07/03/2024, 12:25 PM

## 2024-07-03 NOTE — Progress Notes (Signed)
" °   07/03/24 1234  Vitals  Temp 98.3 F (36.8 C)  Temp Source Axillary  BP 137/83  MAP (mmHg) 99  BP Location Left Arm  BP Method Automatic  Patient Position (if appropriate) Lying  Pulse Rate 77  Pulse Rate Source Monitor  ECG Heart Rate 74  Resp (!) 0  Oxygen Therapy  SpO2 96 %  During Treatment Monitoring  Blood Flow Rate (mL/min) 0 mL/min  Arterial Pressure (mmHg) -7.68 mmHg  Venous Pressure (mmHg) -1.82 mmHg  TMP (mmHg) -52.52 mmHg  Ultrafiltration Rate (mL/min) 785 mL/min  Dialysate Flow Rate (mL/min) 300 ml/min  Duration of HD Treatment -hour(s) 3.5 hour(s)  Cumulative Fluid Removed (mL) per Treatment  996.95  Post Treatment  Dialyzer Clearance Lightly streaked  Hemodialysis Intake (mL) 0 mL  Liters Processed 83.9  Fluid Removed (mL) 1000 mL  Tolerated HD Treatment Yes  Post-Hemodialysis Comments tolerated well full treatment  AVG/AVF Arterial Site Held (minutes) 7 minutes  AVG/AVF Venous Site Held (minutes) 7 minutes  Note  Patient Observations resting with eyes closed no c/o   Received patient in bed to unit.  Alert and oriented.  Informed consent signed and in chart.   TX duration:3.5  Patient tolerated well.  Transported back to the room  Alert, without acute distress.  Hand-off given to patient's nurse.   Access used: RUA fistula Access issues: none  Total UF removed: 1000 Medication(s) given: none Post HD VS: see above Post HD weight: n/a   Docia CHRISTELLA [facility] "

## 2024-07-03 NOTE — Progress Notes (Signed)
 [facility] NEPHROLOGY PROGRESS NOTE  Assessment/ Plan: Pt is a 53 y.o. yo male  with past medical history significant for hypertension, hyperlipidemia, diabetes, CHF, was admitted for right foot diabetic ulcer, osteomyelitis seen as a consultation for the management of ESRD.   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  Got albumin  and was transfused a unit of PRBC's.  This has improved.   - He was also started on midodrine  for hypotension and note the dose was held this AM.  Reduce to 5 mg TID and may be able to stop or wean again soon - note hx of HTN per charting   # Anemia of ESRD:  - last Received Mircera on 12/18 - due again on 1/1.  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  Procedure supervised.  Blood pressure 134/73 and HR 75.  Tolerating goal.  Right AVF in use.  He has slept through most of dialysis    Review of systems:    Unable to complete as he answers questions intermittently.  He shakes head that he is not short of breath    Objective Vital signs in last 24 hours: Vitals:   07/03/24 1000 07/03/24 1023 07/03/24 1030 07/03/24 1100  BP: 122/72 122/69 122/72 135/76  Pulse: 73 75 70 73  Resp: (!) 0 (!) 21 (!) 0 (!) 0  Temp:      TempSrc:      SpO2: 97% 100%    Weight:      Height:       Weight change:   Intake/Output Summary (Last 24 hours) at 07/03/2024 1115 Last data filed at 07/03/2024 0650 Gross per 24 hour  Intake 240 ml  Output 51 ml  Net 189 ml       Labs: RENAL PANEL Recent Labs  Lab 06/28/24 1833 06/29/24 0500 06/30/24 0056 06/30/24 0619 06/30/24 0912  07/01/24 0613 07/02/24 0336 07/03/24 0503  NA 137   < > 129* 120*  --  133* 133* 133*  K 3.1*   < > 2.6* >7.5* 4.5 3.9 3.8 3.9  CL 97*   < > 91* 96*  --  93* 94* 92*  CO2 27   < > 24 22  --  23 23 24   GLUCOSE 108*   < > 98 83  --  100* 94 102*  BUN 22*   < > 33* 31*  --  52* 36* 45*  CREATININE 4.71*   < > 5.34* 5.43*  --  7.44* 5.18* 6.12*  CALCIUM  8.2*   < > 7.9* 7.5*  --  9.3 8.8* 9.4  MG  --   --   --  1.4*  --   --   --   --   PHOS  --   --   --  2.3*  --   --   --  4.4  ALBUMIN  2.4*  --   --   --   --   --   --   --    < > = values in this interval not displayed.  Liver Function Tests: Recent Labs  Lab 06/28/24 1833  AST 24  ALT <5  ALKPHOS 165*  BILITOT 1.7*  PROT 6.4*  ALBUMIN  2.4*   No results for input(s): LIPASE, AMYLASE in the last 168 hours. Recent Labs  Lab 06/28/24 2036  AMMONIA 21   CBC: Recent Labs    12/21/23 1040 12/23/23 1630 06/30/24 0619 07/01/24 0613 07/01/24 1104 07/02/24 0336 07/03/24 0503  HGB 10.7*   < > 8.0* 9.3* 7.5* 8.1* 8.7*  MCV 89.7   < > 93.5 91.8 93.5 89.7 90.9  VITAMINB12 508  --   --   --   --   --   --   FOLATE 10.4  --   --   --   --   --   --   FERRITIN 383.3*  --   --   --   --   --   --   TIBC 239.4*  --   --   --   --   --   --   IRON  48  --   --   --   --   --   --    < > = values in this interval not displayed.    Cardiac Enzymes: No results for input(s): CKTOTAL, CKMB, CKMBINDEX, TROPONINI in the last 168 hours. CBG: Recent Labs  Lab 07/01/24 1010 07/01/24 1542 07/01/24 1954 07/02/24 1203 07/02/24 1600  GLUCAP 103* 101* 87 99 90    Iron  Studies: No results for input(s): IRON , TIBC, TRANSFERRIN, FERRITIN in the last 72 hours. Studies/Results: No results found.   Medications: Infusions:  anticoagulant sodium citrate      cefTRIAXone  (ROCEPHIN )  IV 2 g (07/02/24 1041)   linezolid  (ZYVOX ) IV     tranexamic acid       Scheduled Medications:  sodium chloride    Intravenous Once    calcitRIOL   0.25 mcg Oral BID   Chlorhexidine  Gluconate Cloth  6 each Topical Q0600   Chlorhexidine  Gluconate Cloth  6 each Topical Daily   Chlorhexidine  Gluconate Cloth  6 each Topical Q0600   [START ON 07/05/2024] darbepoetin (ARANESP ) injection - DIALYSIS  100 mcg Subcutaneous Q Thu-1800   enoxaparin  (LOVENOX ) injection  30 mg Subcutaneous Q24H   feeding supplement (GLUCERNA SHAKE)  237 mL Oral BID BM   midodrine   10 mg Oral TID WC   mupirocin  ointment  1 Application Nasal BID    have reviewed scheduled and prn medications.  Physical Exam:    General adult male in bed in no acute distress HEENT normocephalic atraumatic keeps eyes closed and doesn't open but nods head Neck supple trachea midline Lungs clear to auscultation bilaterally normal work of breathing at rest on room air Heart S1S2 no rub Abdomen soft nontender nondistended; obese habitus Extremities bilateral BKA; right leg in wound vac; no pitting edema  Psych blunted affect; no agitation Neuro offers what seems like an incorrect answer to his name; does answer other orienting questions - not oriented to person or location  Access RUE AVF in use   Outpatient HD orders:  Dialyzes at  [facility], TTS, 4 hr, BFR 450, 2k 2 ca, EDW 85.6 kg, last OP HD on 12/24.RUE AVF.  Mircera 100 mcg on 12/18   Todd Mccoy C Todd Mccoy 07/03/2024,11:30 AM  LOS: 5 days

## 2024-07-03 NOTE — Progress Notes (Signed)
 PT Cancellation Note  Patient Details Name: Todd Mccoy MRN: [id number] DOB: 1971-01-22   Cancelled Treatment:    Reason Eval/Treat Not Completed: (P) Patient at procedure or test/unavailable Pt is off the floor for HD. PT will follow back on his return as able.  [name] B. Fleeta Lapidus PT, DPT Acute Rehabilitation Services Please use secure chat or  Call Office [phone]    [name] [facility] 07/03/2024, 8:27 AM

## 2024-07-04 ENCOUNTER — Inpatient Hospital Stay (HOSPITAL_COMMUNITY)

## 2024-07-04 DIAGNOSIS — I959 Hypotension, unspecified: Secondary | ICD-10-CM

## 2024-07-04 DIAGNOSIS — M7989 Other specified soft tissue disorders: Secondary | ICD-10-CM | POA: Diagnosis not present

## 2024-07-04 DIAGNOSIS — M86071 Acute hematogenous osteomyelitis, right ankle and foot: Secondary | ICD-10-CM | POA: Diagnosis not present

## 2024-07-04 LAB — BASIC METABOLIC PANEL WITH GFR
Anion gap: 14 (ref 5–15)
BUN: 28 mg/dL — ABNORMAL HIGH (ref 6–20)
CO2: 26 mmol/L (ref 22–32)
Calcium: 8.7 mg/dL — ABNORMAL LOW (ref 8.9–10.3)
Chloride: 93 mmol/L — ABNORMAL LOW (ref 98–111)
Creatinine, Ser: 4.2 mg/dL — ABNORMAL HIGH (ref 0.61–1.24)
GFR, Estimated: 16 mL/min — ABNORMAL LOW
Glucose, Bld: 127 mg/dL — ABNORMAL HIGH (ref 70–99)
Potassium: 3.6 mmol/L (ref 3.5–5.1)
Sodium: 133 mmol/L — ABNORMAL LOW (ref 135–145)

## 2024-07-04 LAB — CBC
HCT: 27.8 % — ABNORMAL LOW (ref 39.0–52.0)
Hemoglobin: 8.7 g/dL — ABNORMAL LOW (ref 13.0–17.0)
MCH: 27.9 pg (ref 26.0–34.0)
MCHC: 31.3 g/dL (ref 30.0–36.0)
MCV: 89.1 fL (ref 80.0–100.0)
Platelets: 90 K/uL — ABNORMAL LOW (ref 150–400)
RBC: 3.12 MIL/uL — ABNORMAL LOW (ref 4.22–5.81)
RDW: 17.2 % — ABNORMAL HIGH (ref 11.5–15.5)
WBC: 9.9 K/uL (ref 4.0–10.5)
nRBC: 0 % (ref 0.0–0.2)

## 2024-07-04 LAB — SURGICAL PATHOLOGY, GROSS ONLY (NOT ARMC)

## 2024-07-04 LAB — AMMONIA: Ammonia: 15 umol/L (ref 9–35)

## 2024-07-04 MED ORDER — MIDODRINE HCL 5 MG PO TABS
5.0000 mg | ORAL_TABLET | Freq: Two times a day (BID) | ORAL | Status: DC
  Filled 2024-07-04: qty 1

## 2024-07-04 MED ORDER — CHLORHEXIDINE GLUCONATE CLOTH 2 % EX PADS: 6.0000 | MEDICATED_PAD | Freq: Every day | CUTANEOUS | Status: DC

## 2024-07-04 NOTE — Progress Notes (Signed)
  [facility]ESS [facility]  Assessment/ Plan: Pt is a 53 y.o. yo male  with past medical history significant for hypertension, hyperlipidemia, diabetes, CHF, was admitted for right foot diabetic ulcer, osteomyelitis seen as a consultation for the management of ESRD.   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  Got albumin  and was transfused a unit of PRBC's. - He was also started on midodrine  for hypotension - this has been weaned to 5 mg TID and may be able to stop soon - note hx of HTN per charting   # Anemia of ESRD:  - last Received Mircera on 12/18 - due again on 1/1.  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 Not on binders  Disposition - per primary team - they are getting PT/OT eval     Subjective:  Last HD on 12/30 with 1 kg UF.  Patient offers little history and answers limited question.  He cannot tell me what dispo plan is but per charting, team is getting PT/OT eval.   Review of systems:     Unable to complete as he answers questions intermittently.   He shakes head that he is not short of breath  Denies n/v   Objective Vital signs in last 24 hours: Vitals:   07/03/24 2012 07/03/24 2306 07/04/24 0442 07/04/24 0831  BP: 132/72 132/78 125/77 138/81  Pulse: 76 75 69 75  Resp: (!) 21 20 20 20   Temp: 97.8 F (36.6 C) 97.8 F (36.6 C) 98.2 F (36.8 C) 98 F (36.7 C)  TempSrc: Oral Oral Oral Oral  SpO2: 96% 96% 96%   Weight:      Height:       Weight change:  No intake or output data in the 24 hours ending 07/04/24 1325      Labs: RENAL PANEL Recent Labs  Lab 06/28/24 1833 06/29/24 0500 06/30/24 0619 06/30/24 0912 07/01/24 0613 07/02/24 0336  07/03/24 0503 07/04/24 0423  NA 137   < > 120*  --  133* 133* 133* 133*  K 3.1*   < > >7.5* 4.5 3.9 3.8 3.9 3.6  CL 97*   < > 96*  --  93* 94* 92* 93*  CO2 27   < > 22  --  23 23 24 26   GLUCOSE 108*   < > 83  --  100* 94 102* 127*  BUN 22*   < > 31*  --  52* 36* 45* 28*  CREATININE 4.71*   < > 5.43*  --  7.44* 5.18* 6.12* 4.20*  CALCIUM  8.2*   < > 7.5*  --  9.3 8.8* 9.4 8.7*  MG  --   --  1.4*  --   --   --   --   --   PHOS  --   --  2.3*  --   --   --  4.4  --   ALBUMIN  2.4*  --   --   --   --   --   --   --    < > = values in this interval not displayed.    Liver Function Tests: Recent Labs  Lab 06/28/24 1833  AST 24  ALT <5  ALKPHOS 165*  BILITOT 1.7*  PROT 6.4*  ALBUMIN  2.4*   No results for input(s): LIPASE, AMYLASE in the last 168 hours. Recent Labs  Lab 06/28/24 2036  AMMONIA 21   CBC: Recent Labs    12/21/23 1040 12/23/23 1630 07/01/24 0613 07/01/24 1104 07/02/24 0336 07/03/24 0503 07/04/24 0423  HGB 10.7*   < > 9.3* 7.5* 8.1* 8.7* 8.7*  MCV 89.7   < > 91.8 93.5 89.7 90.9 89.1  VITAMINB12 508  --   --   --   --   --   --   FOLATE 10.4  --   --   --   --   --   --   FERRITIN 383.3*  --   --   --   --   --   --   TIBC 239.4*  --   --   --   --   --   --   IRON  48  --   --   --   --   --   --    < > = values in this interval not displayed.    Cardiac Enzymes: No results for input(s): CKTOTAL, CKMB, CKMBINDEX, TROPONINI in the last 168 hours. CBG: Recent Labs  Lab 07/01/24 1010 07/01/24 1542 07/01/24 1954 07/02/24 1203 07/02/24 1600  GLUCAP 103* 101* 87 99 90    Iron  Studies: No results for input(s): IRON , TIBC, TRANSFERRIN, FERRITIN in the last 72 hours. Studies/Results: No results found.   Medications: Infusions:  anticoagulant sodium citrate      tranexamic acid       Scheduled Medications:  sodium chloride    Intravenous Once   calcitRIOL   0.25 mcg Oral BID   Chlorhexidine  Gluconate Cloth  6 each Topical Q0600    Chlorhexidine  Gluconate Cloth  6 each Topical Daily   Chlorhexidine  Gluconate Cloth  6 each Topical Q0600   [START ON 07/05/2024] darbepoetin (ARANESP ) injection - DIALYSIS  100 mcg Subcutaneous Q Thu-1800   enoxaparin  (LOVENOX ) injection  30 mg Subcutaneous Q24H   feeding supplement (GLUCERNA SHAKE)  237 mL Oral BID BM   midodrine   5 mg Oral TID WC   mupirocin  ointment  1 Application Nasal BID    have reviewed scheduled and prn medications.  Physical Exam:     General adult male in bed in no acute distress HEENT normocephalic atraumatic opens eyes and talks to me today Neck supple trachea midline Lungs clear to auscultation bilaterally normal work of breathing at rest on room air Heart S1S2 no rub Abdomen soft nontender nondistended; obese habitus Extremities bilateral BKA; right leg in wound vac; no pitting edema  Psych blunted affect; no agitation Neuro states we are at [facility] and year is 2018   Access RUE AVF with bruit and thrill    Outpatient HD orders:  Dialyzes at  [facility], TTS, 4 hr, BFR 450, 2k 2 ca, EDW 85.6 kg, last OP HD on 12/24.RUE AVF.  Mircera 100 mcg on 12/18   Todd Mccoy 07/04/2024,1:53 PM  LOS: 6 days

## 2024-07-04 NOTE — Progress Notes (Signed)
 Pt receives out-pt HD at [facility] on [facility] on TTS 11:00 am chair time. Will assist as needed.   Randine Mungo Dialysis Navigator [phone]

## 2024-07-04 NOTE — Plan of Care (Signed)
   Problem: Clinical Measurements: Goal: Ability to maintain clinical measurements within normal limits will improve Outcome: Progressing   Problem: Nutrition: Goal: Adequate nutrition will be maintained Outcome: Progressing   Problem: Coping: Goal: Level of anxiety will decrease Outcome: Progressing   Problem: Safety: Goal: Ability to remain free from injury will improve Outcome: Progressing

## 2024-07-04 NOTE — Evaluation (Signed)
 Physical Therapy Evaluation Patient Details Name: Todd Mccoy MRN: [id number] DOB: 02/27/1971 Today's Date: 07/04/2024  History of Present Illness  53 y.o. male who presented to the ED 06/28/24 due to nausea and vomiting as well as increased AMS. 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   S/p 12/28 R BKA PMH:CHF, type 2 diabetes, L BKA, blindness, chronic kidney disease requiring HD but often missing appointments, hyperlipidemia  Clinical Impression  Pt poor historian, due to limited conversation that made sense. Pt oriented to name and situation. Pt able to report that he lives with his mom and niece. Pt rarely follows commands and is further limited in his mobility due to blindness. Pt able to come to long sitting in bed without assist. Does not follow commands for coming to EoB, despite hand being placed on EoB. Ultimately needs total Ax2 for pad scoot to EoB. Once there pt is able to sit EoB with contact guard. Pt flexes and extends knees but not full ROM. Pt returned to longsitting middle of bed and needs mod physical A for bringing torso back to bed surface. Hopefully, pt AMS will clear and he will be more participatory in therapy. Patient will benefit from continued inpatient follow up therapy, <3 hours/day. PT will continue to follow acutely.        If plan is discharge home, recommend the following: A lot of help with walking and/or transfers;A lot of help with bathing/dressing/bathroom;Assistance with cooking/housework;Direct supervision/assist for medications management;Assistance with feeding;Direct supervision/assist for financial management;Assist for transportation;Help with stairs or ramp for entrance;Supervision due to cognitive status   Can travel by private vehicle   No    Equipment Recommendations None recommended by PT     Functional Status Assessment Patient has had a recent decline in their functional  status and demonstrates the ability to make significant improvements in function in a reasonable and predictable amount of time.     Precautions / Restrictions Precautions Precautions: Fall Restrictions Weight Bearing Restrictions Per Provider Order: Yes RLE Weight Bearing Per Provider Order: Non weight bearing (BKA) LLE Weight Bearing Per Provider Order: Non weight bearing (BKA)      Mobility  Bed Mobility Overal bed mobility: Needs Assistance Bed Mobility: Supine to Sit, Sit to Supine     Supine to sit: Supervision, Total assist, +2 for physical assistance Sit to supine: Mod assist   General bed mobility comments: pt able to come to longsitting in the bed easily, needs total Ax2 for pad scoot to EoB, once there he is able to maintain static sitting balance, needs mod A for physical support of torso back to inclined HoB    Transfers                   General transfer comment: pt unable to follow commands to make safe transfers        Balance Overall balance assessment: Needs assistance Sitting-balance support: No upper extremity supported, Feet unsupported Sitting balance-Leahy Scale: Fair Sitting balance - Comments: able to balance on EoB                                     Pertinent Vitals/Pain Pain Assessment Pain Assessment: Faces Faces Pain Scale: Hurts little more Pain Location: R residual limb with pressure into bed despite being told not to dig R limb into bed Pain Descriptors / Indicators:  Grimacing, Guarding Pain Intervention(s): Limited activity within patient's tolerance, Repositioned    Home Living Family/patient expects to be discharged to:: Private residence Living Arrangements: Parent;Other relatives;Children Available Help at Discharge: Family;Available PRN/intermittently Type of Home: House Home Access: Ramped entrance       Home Layout: Able to live on main level with bedroom/bathroom Home Equipment: Rolling Walker (2  wheels);Rollator (4 wheels);BSC/3in1;Tub bench;Wheelchair - manual Additional Comments: lives with mom, son and niece    Prior Function Prior Level of Function : Needs assist             Mobility Comments: reports he uses a wheelchair       Extremity/Trunk Assessment   Upper Extremity Assessment Upper Extremity Assessment: Generalized weakness;Difficult to assess due to impaired cognition    Lower Extremity Assessment Lower Extremity Assessment: RLE deficits/detail;LLE deficits/detail;Difficult to assess due to impaired cognition RLE Deficits / Details: new R BKA with wound vac LLE Deficits / Details: hx L BKA       Communication   Communication Communication: Impaired Factors Affecting Communication: Reduced clarity of speech;Difficulty expressing self    Cognition Arousal: Lethargic Behavior During Therapy: Restless, Flat affect   PT - Cognitive impairments: Orientation, Awareness, Initiation, Sequencing, Problem solving, Safety/Judgement, Attention   Orientation impairments: Person, Situation                   PT - Cognition Comments: pt unable to follow commands for movement despite reporting he can move to EoB I do it all the time poor awareness of situation Following commands: Impaired Following commands impaired: Follows one step commands inconsistently, Only follows one step commands consistently     Cueing Cueing Techniques: Verbal cues, Tactile cues     General Comments General comments (skin integrity, edema, etc.): VSS on RA        Assessment/Plan    PT Assessment Patient needs continued PT services  PT Problem List Decreased strength;Decreased mobility;Decreased cognition;Decreased safety awareness;Decreased skin integrity;Pain       PT Treatment Interventions DME instruction;Functional mobility training;Therapeutic activities;Therapeutic exercise;Balance training;Cognitive remediation;Patient/family education    PT Goals (Current  goals can be found in the Care Plan section)  Acute Rehab PT Goals PT Goal Formulation: Patient unable to participate in goal setting Time For Goal Achievement: 07/18/24 Potential to Achieve Goals: Fair    Frequency Min 2X/week        AM-PAC PT 6 Clicks Mobility  Outcome Measure Help needed turning from your back to your side while in a flat bed without using bedrails?: None Help needed moving from lying on your back to sitting on the side of a flat bed without using bedrails?: A Lot Help needed moving to and from a bed to a chair (including a wheelchair)?: Total Help needed standing up from a chair using your arms (e.g., wheelchair or bedside chair)?: Total Help needed to walk in hospital room?: Total Help needed climbing 3-5 steps with a railing? : Total 6 Click Score: 10    End of Session   Activity Tolerance: Patient tolerated treatment well Patient left: in bed;with call bell/phone within reach;with bed alarm set Nurse Communication: Mobility status PT Visit Diagnosis: Muscle weakness (generalized) (M62.81);Other abnormalities of gait and mobility (R26.89);Difficulty in walking, not elsewhere classified (R26.2);Pain Pain - Right/Left: Right Pain - part of body: Leg    Time: 8483-8454 PT Time Calculation (min) (ACUTE ONLY): 29 min   Charges:   PT Evaluation $PT Eval Low Complexity: 1 Low PT Treatments $Therapeutic Activity:  8-22 mins PT General Charges $$ ACUTE PT VISIT: 1 Visit         Shanin Szymanowski B. Fleeta Lapidus PT, DPT Acute Rehabilitation Services Please use secure chat or  Call Office [phone]   [name] [facility] 07/04/2024, 5:16 PM

## 2024-07-04 NOTE — Progress Notes (Signed)
 " PROGRESS NOTE    Todd Mccoy  FMW:[id number] DOB: 01/10/1971 DOA: 06/28/2024 PCP: Delbert Clam, MD    Brief Narrative:  53 year old PMHx of CHF, T2DM, HLD who presented to ED due to nausea and vomiting over the last several days.  Was undergoing treatment of osteomyelitis of his right foot with podiatry.  Underwent CT abdomen which showed no evidence of obstruction but noted possible cystitis.  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 Antibiotics were discontinued on 12/31 given adequate source control after BKA. PT recommending SNF.   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  - Afebrile, leukocytosis resolved - Discontinued antibiotics - Wound vac in place  - NWB on the right - PT recommending SNF  # Hypotension - resolving - s/p IV albumin , 1 unit pRBCs, and midodrine  - SBP 125-138 - Will decrease midodrine  to 5 mg BID  # ESRD - Nephrology following for routine HD  # Acute metabolic encephalopathy - Unclear what his baseline mentation is - Lethargic, currently oriented to person and place - Ammonia 15 - CT head ordered  # Anemia of chronic kidney disease - On Mircera and aranesp  per nephrology - s/p 1 unit pRBC - Hgb relatively stable  # History of hypertension - Home BP meds held given transient hypotension  # Chronic thrombocytopenia - Continue to monitor  # 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%, G2DD - Management of volume with HD    Scheduled Meds:  sodium chloride    Intravenous Once   calcitRIOL   0.25 mcg Oral BID   Chlorhexidine  Gluconate Cloth  6 each Topical Q0600   Chlorhexidine   Gluconate Cloth  6 each Topical Daily   Chlorhexidine  Gluconate Cloth  6 each Topical Q0600   [START ON 07/05/2024] darbepoetin (ARANESP ) injection - DIALYSIS  100 mcg Subcutaneous Q Thu-1800   enoxaparin  (LOVENOX ) injection  30 mg Subcutaneous Q24H   feeding supplement (GLUCERNA SHAKE)  237 mL Oral BID BM   midodrine   5 mg Oral TID WC   mupirocin  ointment  1 Application Nasal BID   Continuous Infusions:  anticoagulant sodium citrate      tranexamic acid      PRN Meds:.acetaminophen  **OR** acetaminophen , alteplase , anticoagulant sodium citrate , heparin , lidocaine  (PF), lidocaine -prilocaine , ondansetron  **OR** ondansetron  (ZOFRAN ) IV, mouth rinse, oxyCODONE , pentafluoroprop-tetrafluoroeth  Current Outpatient Medications  Medication Instructions   acetaminophen  (TYLENOL ) 500 mg, Oral, 2 times daily   amLODipine  (NORVASC ) 10 mg, Oral, Daily   atorvastatin  (LIPITOR) 20 mg, Oral, Daily   Baclofen  5 mg, Oral, 2 times daily PRN   calcitRIOL  (ROCALTROL ) 0.25 mcg, Oral, 2 times daily   carvedilol  (COREG ) 25 mg, Oral, 2 times daily with meals   docusate sodium  (COLACE) 100 mg, Oral, 2 times daily PRN   doxycycline  (VIBRA -TABS) 100 mg, Oral, 2 times daily   ferrous sulfate  325 mg, Oral, Daily with breakfast   furosemide  (LASIX ) 40 mg, Oral, 2 times daily   gabapentin  (NEURONTIN ) 600 mg, Oral, 2 times daily   gentamicin ointment (GARAMYCIN) 0.1 % SMARTSIG:sparingly Topical   hydrALAZINE  (APRESOLINE ) 50 mg, 3 times daily   hydrOXYzine  (ATARAX ) 50 mg, Oral, 3 times daily PRN  isosorbide  mononitrate (IMDUR ) 60 mg, Oral, Daily   Lantus  SoloStar 5 Units, Subcutaneous, Daily at bedtime   lidocaine  (LIDODERM ) 5 % 1 patch, Transdermal, Every 24 hours, Remove & Discard patch within 12 hours or as directed by MD   magnesium  oxide (MAG-OX) 400 mg, Oral, 2 times daily   minocycline (MINOCIN) 100 mg, 2 times daily   multivitamin (RENA-VIT) TABS tablet 1 tablet, Oral, Daily at bedtime   nortriptyline   (PAMELOR ) 50 mg, Oral, Daily at bedtime, Start with 1 capsule at nighttime for 1 week, then if no side effects increase to 2 capsules at nighttime   omeprazole (PRILOSEC) 20 mg, Oral, Daily   polyethylene glycol (MIRALAX  / GLYCOLAX ) 17 g, Oral, Daily PRN   sevelamer (RENAGEL) 800 mg, Oral, 3 times daily with meals   torsemide  (DEMADEX ) 80 mg, Oral, Daily    DVT prophylaxis: SCDs Start: 06/30/24 1641 enoxaparin  (LOVENOX ) injection 30 mg Start: 06/29/24 1000 SCDs Start: 06/28/24 2305   Code Status:   Code Status: Full Code  Family Communication: None at bedside  Disposition Plan: PT recommending SNF, TOC consulted PT - Follow Up Recommendations: Skilled nursing-short term rehab (<3 hours/day) - PT equipment: None recommended by PT OT -   -    Level of care: Progressive  Consultants:  Nephrology, orthopedic surgery, podiatry   Subjective: NAEO. Examined at bedside. Lethargic but will awake to voice.  Oriented to person and place but not time.  Does not voice any acute concerns.  Objective: Vitals:   07/03/24 2012 07/03/24 2306 07/04/24 0442 07/04/24 0831  BP: 132/72 132/78 125/77 138/81  Pulse: 76 75 69 75  Resp: (!) 21 20 20 20   Temp: 97.8 F (36.6 C) 97.8 F (36.6 C) 98.2 F (36.8 C) 98 F (36.7 C)  TempSrc: Oral Oral Oral Oral  SpO2: 96% 96% 96%   Weight:      Height:        Intake/Output Summary (Last 24 hours) at 07/04/2024 1921 Last data filed at 07/04/2024 1849 Gross per 24 hour  Intake --  Output 0 ml  Net 0 ml   Filed Weights   07/01/24 1639 07/01/24 1943 07/03/24 0754  Weight: 86 kg 86 kg 90.1 kg    Examination:  Gen: NAD, A&O to person and place only, lethargic HEENT: NCAT Neck: Supple CV: RRR, no murmurs Resp: normal WOB, CTAB anteriorly Abd: Soft, NTND, no guarding Ext: Bilateral BKA, RLE wound vac in place, RUE AVF Skin: Warm, dry, no rashes/lesions Neuro: Able to follow simple commands, otherwise limited exam given lethargy    Data  Reviewed: I have personally reviewed following labs and imaging studies  CBC: Recent Labs  Lab 06/28/24 1833 06/29/24 0500 07/01/24 0613 07/01/24 1104 07/02/24 0336 07/03/24 0503 07/04/24 0423  WBC 30.2*   < > 18.4* 16.6* 11.7* 12.7* 9.9  NEUTROABS 27.4*  --   --  14.4*  --   --   --   HGB 9.4*   < > 9.3* 7.5* 8.1* 8.7* 8.7*  HCT 31.1*   < > 30.4* 24.3* 26.1* 27.9* 27.8*  MCV 93.7   < > 91.8 93.5 89.7 90.9 89.1  PLT 112*   < > 93* 82* 69* 88* 90*   < > = values in this interval not displayed.   Basic Metabolic Panel: Recent Labs  Lab 06/30/24 0619 06/30/24 0912 07/01/24 0613 07/02/24 0336 07/03/24 0503 07/04/24 0423  NA 120*  --  133* 133* 133* 133*  K >7.5* 4.5 3.9  3.8 3.9 3.6  CL 96*  --  93* 94* 92* 93*  CO2 22  --  23 23 24 26   GLUCOSE 83  --  100* 94 102* 127*  BUN 31*  --  52* 36* 45* 28*  CREATININE 5.43*  --  7.44* 5.18* 6.12* 4.20*  CALCIUM  7.5*  --  9.3 8.8* 9.4 8.7*  MG 1.4*  --   --   --   --   --   PHOS 2.3*  --   --   --  4.4  --    GFR: Estimated Creatinine Clearance: 23.5 mL/min (A) (by C-G formula based on SCr of 4.2 mg/dL (H)). Liver Function Tests: Recent Labs  Lab 06/28/24 1833  AST 24  ALT <5  ALKPHOS 165*  BILITOT 1.7*  PROT 6.4*  ALBUMIN  2.4*   No results for input(s): LIPASE, AMYLASE in the last 168 hours. Recent Labs  Lab 06/28/24 2036 07/04/24 1422  AMMONIA 21 15   Coagulation Profile: Recent Labs  Lab 06/29/24 0500  INR 1.8*   Cardiac Enzymes: No results for input(s): CKTOTAL, CKMB, CKMBINDEX, TROPONINI in the last 168 hours. BNP (last 3 results) No results for input(s): PROBNP in the last 8760 hours. HbA1C: No results for input(s): HGBA1C in the last 72 hours. CBG: Recent Labs  Lab 07/01/24 1010 07/01/24 1542 07/01/24 1954 07/02/24 1203 07/02/24 1600  GLUCAP 103* 101* 87 99 90   Lipid Profile: No results for input(s): CHOL, HDL, LDLCALC, TRIG, CHOLHDL, LDLDIRECT in the last 72  hours. Thyroid Function Tests: No results for input(s): TSH, T4TOTAL, FREET4, T3FREE, THYROIDAB in the last 72 hours. Anemia Panel: No results for input(s): VITAMINB12, FOLATE, FERRITIN, TIBC, IRON , RETICCTPCT in the last 72 hours. Sepsis Labs: No results for input(s): PROCALCITON, LATICACIDVEN in the last 168 hours.  Recent Results (from the past 240 hours)  Resp panel by RT-PCR (RSV, Flu A&B, Covid) Anterior Nasal Swab     Status: None   Collection Time: 06/28/24  6:33 PM   Specimen: Anterior Nasal Swab  Result Value Ref Range Status   SARS Coronavirus 2 by RT PCR NEGATIVE NEGATIVE Final    Comment: (NOTE) SARS-CoV-2 target nucleic acids are NOT DETECTED.  The SARS-CoV-2 RNA is generally detectable in upper respiratory specimens during the acute phase of infection. The lowest concentration of SARS-CoV-2 viral copies this assay can detect is 138 copies/mL. A negative result does not preclude SARS-Cov-2 infection and should not be used as the sole basis for treatment or other patient management decisions. A negative result may occur with  improper specimen collection/handling, submission of specimen other than nasopharyngeal swab, presence of viral mutation(s) within the areas targeted by this assay, and inadequate number of viral copies(<138 copies/mL). A negative result must be combined with clinical observations, patient history, and epidemiological information. The expected result is Negative.  Fact Sheet for Patients:  bloggercourse.com  Fact Sheet for Healthcare Providers:  seriousbroker.it  This test is no t yet approved or cleared by the [place]  FDA and  has been authorized for detection and/or diagnosis of SARS-CoV-2 by FDA under an Emergency Use Authorization (EUA). This EUA will remain  in effect (meaning this test can be used) for the duration of the COVID-19 declaration under Section  564(b)(1) of the Act, 21 U.S.C.section 360bbb-3(b)(1), unless the authorization is terminated  or revoked sooner.       Influenza A by PCR NEGATIVE NEGATIVE Final   Influenza B by PCR NEGATIVE NEGATIVE Final  Comment: (NOTE) The Xpert Xpress SARS-CoV-2/FLU/RSV plus assay is intended as an aid in the diagnosis of influenza from Nasopharyngeal swab specimens and should not be used as a sole basis for treatment. Nasal washings and aspirates are unacceptable for Xpert Xpress SARS-CoV-2/FLU/RSV testing.  Fact Sheet for Patients: bloggercourse.com  Fact Sheet for Healthcare Providers: seriousbroker.it  This test is not yet approved or cleared by the [place]  FDA and has been authorized for detection and/or diagnosis of SARS-CoV-2 by FDA under an Emergency Use Authorization (EUA). This EUA will remain in effect (meaning this test can be used) for the duration of the COVID-19 declaration under Section 564(b)(1) of the Act, 21 U.S.C. section 360bbb-3(b)(1), unless the authorization is terminated or revoked.     Resp Syncytial Virus by PCR NEGATIVE NEGATIVE Final    Comment: (NOTE) Fact Sheet for Patients: bloggercourse.com  Fact Sheet for Healthcare Providers: seriousbroker.it  This test is not yet approved or cleared by the [place]  FDA and has been authorized for detection and/or diagnosis of SARS-CoV-2 by FDA under an Emergency Use Authorization (EUA). This EUA will remain in effect (meaning this test can be used) for the duration of the COVID-19 declaration under Section 564(b)(1) of the Act, 21 U.S.C. section 360bbb-3(b)(1), unless the authorization is terminated or revoked.  Performed at [facility], [address], [place], [name] [address]   Urine Culture     Status: Abnormal   Collection Time: 06/28/24  9:33 PM   Specimen: Urine, Random   Result Value Ref Range Status   Specimen Description   Final    URINE, RANDOM Performed at [facility], [address] [place], [name] [address]    Special Requests   Final    NONE Reflexed from [phone] Performed at [facility], [address], [place], [name] [address]    Culture >=100,000 COLONIES/mL ESCHERICHIA COLI (A)  Final   Report Status 06/30/2024 FINAL  Final   Organism ID, Bacteria ESCHERICHIA COLI (A)  Final      Susceptibility   Escherichia coli - MIC*    AMPICILLIN >=32 RESISTANT Resistant     CEFAZOLIN  (URINE) Value in next row Sensitive      8 SENSITIVEThis is a modified FDA-approved test that has been validated and its performance characteristics determined by the reporting laboratory.  This laboratory is certified under the Clinical Laboratory Improvement Amendments CLIA as qualified to perform high complexity clinical laboratory testing.    CEFEPIME  Value in next row Sensitive      8 SENSITIVEThis is a modified FDA-approved test that has been validated and its performance characteristics determined by the reporting laboratory.  This laboratory is certified under the Clinical Laboratory Improvement Amendments CLIA as qualified to perform high complexity clinical laboratory testing.    ERTAPENEM Value in next row Sensitive      8 SENSITIVEThis is a modified FDA-approved test that has been validated and its performance characteristics determined by the reporting laboratory.  This laboratory is certified under the Clinical Laboratory Improvement Amendments CLIA as qualified to perform high complexity clinical laboratory testing.    CEFTRIAXONE  Value in next row Sensitive      8 SENSITIVEThis is a modified FDA-approved test that has been validated and its performance characteristics determined by the reporting laboratory.  This laboratory is certified under the Clinical Laboratory Improvement Amendments CLIA as qualified to perform high  complexity clinical laboratory testing.    CIPROFLOXACIN Value in next row Resistant  8 SENSITIVEThis is a modified FDA-approved test that has been validated and its performance characteristics determined by the reporting laboratory.  This laboratory is certified under the Clinical Laboratory Improvement Amendments CLIA as qualified to perform high complexity clinical laboratory testing.    GENTAMICIN Value in next row Sensitive      8 SENSITIVEThis is a modified FDA-approved test that has been validated and its performance characteristics determined by the reporting laboratory.  This laboratory is certified under the Clinical Laboratory Improvement Amendments CLIA as qualified to perform high complexity clinical laboratory testing.    NITROFURANTOIN Value in next row Sensitive      8 SENSITIVEThis is a modified FDA-approved test that has been validated and its performance characteristics determined by the reporting laboratory.  This laboratory is certified under the Clinical Laboratory Improvement Amendments CLIA as qualified to perform high complexity clinical laboratory testing.    TRIMETH /SULFA  Value in next row Sensitive      8 SENSITIVEThis is a modified FDA-approved test that has been validated and its performance characteristics determined by the reporting laboratory.  This laboratory is certified under the Clinical Laboratory Improvement Amendments CLIA as qualified to perform high complexity clinical laboratory testing.    AMPICILLIN/SULBACTAM Value in next row Resistant      8 SENSITIVEThis is a modified FDA-approved test that has been validated and its performance characteristics determined by the reporting laboratory.  This laboratory is certified under the Clinical Laboratory Improvement Amendments CLIA as qualified to perform high complexity clinical laboratory testing.    PIP/TAZO Value in next row Sensitive      <=4 SENSITIVEThis is a modified FDA-approved test that has been  validated and its performance characteristics determined by the reporting laboratory.  This laboratory is certified under the Clinical Laboratory Improvement Amendments CLIA as qualified to perform high complexity clinical laboratory testing.    MEROPENEM Value in next row Sensitive      <=4 SENSITIVEThis is a modified FDA-approved test that has been validated and its performance characteristics determined by the reporting laboratory.  This laboratory is certified under the Clinical Laboratory Improvement Amendments CLIA as qualified to perform high complexity clinical laboratory testing.    * >=100,000 COLONIES/mL ESCHERICHIA COLI  Surgical pcr screen     Status: Abnormal   Collection Time: 06/30/24  1:17 PM   Specimen: Nasal Mucosa; Nasal Swab  Result Value Ref Range Status   MRSA, PCR NEGATIVE NEGATIVE Final   Staphylococcus aureus POSITIVE (A) NEGATIVE Final    Comment: (NOTE) The Xpert SA Assay (FDA approved for NASAL specimens in patients 54 years of age and older), is one component of a comprehensive surveillance program. It is not intended to diagnose infection nor to guide or monitor treatment. Performed at [facility], [address], [place], [name] [address]   Aerobic/Anaerobic Culture w Gram Stain (surgical/deep wound)     Status: None (Preliminary result)   Collection Time: 06/30/24  1:50 PM   Specimen: Path fluid; Body Fluid  Result Value Ref Range Status   Specimen Description FLUID  Final   Special Requests RIGHT FOOT GANGRENE  Final   Gram Stain   Final    FEW WBC PRESENT, PREDOMINANTLY PMN MODERATE GRAM POSITIVE COCCI FEW GRAM NEGATIVE RODS Performed at [facility], [address], [place], [name] [address]    Culture   Final    MODERATE ESCHERICHIA COLI FEW ENTEROCOCCUS FAECALIS NO ANAEROBES ISOLATED; CULTURE IN PROGRESS FOR 5 DAYS  Report Status PENDING  Incomplete   Organism ID, Bacteria ESCHERICHIA COLI  Final   Organism ID, Bacteria  ENTEROCOCCUS FAECALIS  Final      Susceptibility   Escherichia coli - MIC*    AMPICILLIN >=32 RESISTANT Resistant     CEFAZOLIN  (NON-URINE) 8 RESISTANT Resistant     CEFEPIME  <=0.12 SENSITIVE Sensitive     ERTAPENEM <=0.12 SENSITIVE Sensitive     CEFTRIAXONE  <=0.25 SENSITIVE Sensitive     CIPROFLOXACIN >=4 RESISTANT Resistant     GENTAMICIN <=1 SENSITIVE Sensitive     MEROPENEM <=0.25 SENSITIVE Sensitive     TRIMETH /SULFA  <=20 SENSITIVE Sensitive     AMPICILLIN/SULBACTAM >=32 RESISTANT Resistant     PIP/TAZO Value in next row Sensitive      <=4 SENSITIVEThis is a modified FDA-approved test that has been validated and its performance characteristics determined by the reporting laboratory.  This laboratory is certified under the Clinical Laboratory Improvement Amendments CLIA as qualified to perform high complexity clinical laboratory testing.    * MODERATE ESCHERICHIA COLI   Enterococcus faecalis - MIC*    AMPICILLIN Value in next row Sensitive      <=4 SENSITIVEThis is a modified FDA-approved test that has been validated and its performance characteristics determined by the reporting laboratory.  This laboratory is certified under the Clinical Laboratory Improvement Amendments CLIA as qualified to perform high complexity clinical laboratory testing.    VANCOMYCIN  Value in next row Sensitive      <=4 SENSITIVEThis is a modified FDA-approved test that has been validated and its performance characteristics determined by the reporting laboratory.  This laboratory is certified under the Clinical Laboratory Improvement Amendments CLIA as qualified to perform high complexity clinical laboratory testing.    GENTAMICIN SYNERGY Value in next row Resistant      <=4 SENSITIVEThis is a modified FDA-approved test that has been validated and its performance characteristics determined by the reporting laboratory.  This laboratory is certified under the Clinical Laboratory Improvement Amendments CLIA as  qualified to perform high complexity clinical laboratory testing.    * FEW ENTEROCOCCUS FAECALIS     Radiology Studies: No results found.    Unresulted Labs (From admission, onward)     Start     Ordered   07/05/24 0500  CBC  Tomorrow morning,   R       Question:  Specimen collection method  Answer:  Lab=Lab collect   07/04/24 1936   07/05/24 0500  Renal function panel  Tomorrow morning,   R       Question:  Specimen collection method  Answer:  Lab=Lab collect   07/04/24 1936             LOS:  LOS: 6 days   Time Spent: 45 minutes  Todd Burgoon Al-Sultani, MD [facility]  If 7PM-7AM, please contact night-coverage  07/04/2024, 7:21 PM      "

## 2024-07-05 DIAGNOSIS — M86071 Acute hematogenous osteomyelitis, right ankle and foot: Secondary | ICD-10-CM | POA: Diagnosis not present

## 2024-07-05 DIAGNOSIS — I959 Hypotension, unspecified: Secondary | ICD-10-CM | POA: Diagnosis not present

## 2024-07-05 DIAGNOSIS — M7989 Other specified soft tissue disorders: Secondary | ICD-10-CM | POA: Diagnosis not present

## 2024-07-05 LAB — RENAL FUNCTION PANEL
Albumin: 2.6 g/dL — ABNORMAL LOW (ref 3.5–5.0)
Anion gap: 14 (ref 5–15)
BUN: 39 mg/dL — ABNORMAL HIGH (ref 6–20)
CO2: 26 mmol/L (ref 22–32)
Calcium: 8.9 mg/dL (ref 8.9–10.3)
Chloride: 93 mmol/L — ABNORMAL LOW (ref 98–111)
Creatinine, Ser: 5.24 mg/dL — ABNORMAL HIGH (ref 0.61–1.24)
GFR, Estimated: 12 mL/min — ABNORMAL LOW
Glucose, Bld: 101 mg/dL — ABNORMAL HIGH (ref 70–99)
Phosphorus: 4.4 mg/dL (ref 2.5–4.6)
Potassium: 3.9 mmol/L (ref 3.5–5.1)
Sodium: 134 mmol/L — ABNORMAL LOW (ref 135–145)

## 2024-07-05 LAB — CBC
HCT: 28.7 % — ABNORMAL LOW (ref 39.0–52.0)
Hemoglobin: 8.9 g/dL — ABNORMAL LOW (ref 13.0–17.0)
MCH: 27.8 pg (ref 26.0–34.0)
MCHC: 31 g/dL (ref 30.0–36.0)
MCV: 89.7 fL (ref 80.0–100.0)
Platelets: 89 K/uL — ABNORMAL LOW (ref 150–400)
RBC: 3.2 MIL/uL — ABNORMAL LOW (ref 4.22–5.81)
RDW: 16.9 % — ABNORMAL HIGH (ref 11.5–15.5)
WBC: 13.4 K/uL — ABNORMAL HIGH (ref 4.0–10.5)
nRBC: 0.2 % (ref 0.0–0.2)

## 2024-07-05 LAB — AEROBIC/ANAEROBIC CULTURE W GRAM STAIN (SURGICAL/DEEP WOUND)

## 2024-07-05 MED ORDER — HEPARIN SODIUM (PORCINE) 1000 UNIT/ML DIALYSIS: 1000.0000 [IU] | INTRAMUSCULAR | Status: DC | PRN

## 2024-07-05 MED ORDER — POLYETHYLENE GLYCOL 3350 17 G PO PACK
17.0000 g | PACK | Freq: Every day | ORAL | Status: DC
  Administered 2024-07-05: 17 g via ORAL
  Filled 2024-07-05: qty 1

## 2024-07-05 MED ORDER — SENNA 8.6 MG PO TABS
1.0000 | ORAL_TABLET | Freq: Every day | ORAL | Status: DC
  Administered 2024-07-05 – 2024-07-06 (×2): 8.6 mg via ORAL
  Filled 2024-07-05 (×3): qty 1

## 2024-07-05 MED ORDER — LIDOCAINE HCL (PF) 1 % IJ SOLN: 5.0000 mL | INTRAMUSCULAR | Status: DC | PRN

## 2024-07-05 MED ORDER — ANTICOAGULANT SODIUM CITRATE 4% (200MG/5ML) IV SOLN: 5.0000 mL | Status: DC | PRN

## 2024-07-05 MED ORDER — PENTAFLUOROPROP-TETRAFLUOROETH EX AERO: 1.0000 | INHALATION_SPRAY | CUTANEOUS | Status: DC | PRN

## 2024-07-05 MED ORDER — LIDOCAINE-PRILOCAINE 2.5-2.5 % EX CREA: 1.0000 | TOPICAL_CREAM | CUTANEOUS | Status: DC | PRN

## 2024-07-05 NOTE — TOC Progression Note (Addendum)
 Transition of Care [facility]) - Progression Note    Patient Details  Name: Todd Mccoy MRN: [id number] Date of Birth: August 04, 1970  Transition of Care [facility]) CM/SW Contact  Todd Mccoy, [name] Phone Number: 07/05/2024, 10:23 AM  Clinical Narrative:    11:01 am - received call back from patient's son- remains agreeable to SNF placement. CSW inquired about PCP- he was not sure if patient had a PCP but believes he do.  Informed family about [facility] program as a resource. All questions answered.  10:23 am CSW acknowledged consult for SNF. CSW spoke with patient's son Jaylin on 12/29- he gave CSW permission to competed SNF assessment if PT recommended short term rehab at [facility].   SNF assessment completed  CSW called patient's son to provide update, no answer- did not leave voice message  TOC will provide bed offers once available  TOC will continue to follow and assist with discharge planning.   Todd Mccoy, MSW, LCSW Clinical Social Worker       Barriers to Discharge: Continued Medical Work up               Expected Discharge Plan and Services In-house Referral: Clinical Social Work     Living arrangements for the past 2 months: Single Family Home                                       Social Drivers of Health (SDOH) Interventions SDOH Screenings   Food Insecurity: Food Insecurity Present (06/30/2024)  Housing: Low Risk (06/30/2024)  Transportation Needs: No Transportation Needs (06/30/2024)  Utilities: Not At Risk (06/30/2024)  Alcohol  Screen: Low Risk (06/26/2024)  Depression (PHQ2-9): Low Risk (06/26/2024)  Financial Resource Strain: Low Risk (06/26/2024)  Physical Activity: Inactive (06/26/2024)  Social Connections: Socially Isolated (06/26/2024)  Stress: No Stress Concern Present (06/26/2024)  Tobacco Use: High Risk (07/01/2024)  Health Literacy: Inadequate Health Literacy (06/26/2024)    Readmission Risk Interventions     No  data to display

## 2024-07-05 NOTE — Progress Notes (Signed)
 [facility] NEPHROLOGY PROGRESS NOTE  Assessment/ Plan: Pt is a 54 y.o. yo male  with past medical history significant for hypertension, hyperlipidemia, diabetes, CHF, was admitted for right foot diabetic ulcer, osteomyelitis seen as a consultation for the management of ESRD.   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 Not on binders  Disposition - per primary team.  Per charting seems plan is for SNF    Subjective:  Last HD on 12/30 with 1 kg UF.  He hasn't yet had it today.  A little more conversant today.  He states it's the new year.   Review of systems:      Denies shortness of breath or chest pain  Denies n/v   Objective Vital signs in last 24 hours: Vitals:   07/04/24 0831 07/04/24 1938 07/04/24 2335 07/05/24 0344  BP: 138/81 (!) 146/86 (!) 141/80 (!) 151/83  Pulse: 75     Resp: 20 16 14 20   Temp: 98 F (36.7 C) 97.9 F (36.6 C) 98.2 F (36.8 C) 97.7 F (36.5 C)  TempSrc: Oral Oral Oral Oral  SpO2:      Weight:      Height:       Weight change:   Intake/Output Summary (Last 24 hours) at 07/05/2024 1159 Last data filed at 07/05/2024 9287 Gross per 24 hour  Intake --  Output 50 ml  Net -50 ml        Labs: RENAL PANEL Recent Labs  Lab 06/28/24 1833 06/29/24 0500 06/30/24 0619 06/30/24 0912 07/01/24 0613 07/02/24 0336 07/03/24 0503 07/04/24 0423  NA 137   < > 120*  --  133* 133* 133* 133*  K 3.1*   < > >7.5* 4.5 3.9 3.8 3.9 3.6  CL 97*   < > 96*  --  93* 94* 92* 93*  CO2 27    < > 22  --  23 23 24 26   GLUCOSE 108*   < > 83  --  100* 94 102* 127*  BUN 22*   < > 31*  --  52* 36* 45* 28*  CREATININE 4.71*   < > 5.43*  --  7.44* 5.18* 6.12* 4.20*  CALCIUM  8.2*   < > 7.5*  --  9.3 8.8* 9.4 8.7*  MG  --   --  1.4*  --   --   --   --   --   PHOS  --   --  2.3*  --   --   --  4.4  --   ALBUMIN  2.4*  --   --   --   --   --   --   --    < > = values in this interval not displayed.    Liver Function Tests: Recent Labs  Lab 06/28/24 1833  AST 24  ALT <5  ALKPHOS 165*  BILITOT 1.7*  PROT 6.4*  ALBUMIN  2.4*  No results for input(s): LIPASE, AMYLASE in the last 168 hours. Recent Labs  Lab 06/28/24 2036 07/04/24 1422  AMMONIA 21 15   CBC: Recent Labs    12/21/23 1040 12/23/23 1630 07/01/24 0613 07/01/24 1104 07/02/24 0336 07/03/24 0503 07/04/24 0423  HGB 10.7*   < > 9.3* 7.5* 8.1* 8.7* 8.7*  MCV 89.7   < > 91.8 93.5 89.7 90.9 89.1  VITAMINB12 508  --   --   --   --   --   --   FOLATE 10.4  --   --   --   --   --   --   FERRITIN 383.3*  --   --   --   --   --   --   TIBC 239.4*  --   --   --   --   --   --   IRON  48  --   --   --   --   --   --    < > = values in this interval not displayed.    Cardiac Enzymes: No results for input(s): CKTOTAL, CKMB, CKMBINDEX, TROPONINI in the last 168 hours. CBG: Recent Labs  Lab 07/01/24 1010 07/01/24 1542 07/01/24 1954 07/02/24 1203 07/02/24 1600  GLUCAP 103* 101* 87 99 90    Iron  Studies: No results for input(s): IRON , TIBC, TRANSFERRIN, FERRITIN in the last 72 hours. Studies/Results: CT HEAD WO CONTRAST ( ) Result Date: 07/04/2024 EXAM: CT HEAD WITHOUT CONTRAST 07/04/2024 08:30:11 PM TECHNIQUE: CT of the head was performed without the administration of intravenous contrast. Automated exposure control, iterative reconstruction, and/or weight based adjustment of the mA/kV was utilized to reduce the radiation dose to as low as reasonably achievable. COMPARISON: 12/23/2023 CLINICAL  HISTORY: altered mental status FINDINGS: BRAIN AND VENTRICLES: No acute hemorrhage. No evidence of acute infarct. No hydrocephalus. No extra-axial collection. No mass effect or midline shift. Atherosclerotic calcifications within the cavernous internal carotid and vertebral arteries. ORBITS: Right retinal tamponade procedure. Unchanged left sub-retinal effusion. SINUSES: No acute abnormality. SOFT TISSUES AND SKULL: No acute soft tissue abnormality. No skull fracture. IMPRESSION: 1. No acute intracranial abnormality. Electronically signed by: Franky Stanford MD 07/04/2024 11:28 PM EST RP Workstation: [id number]     Medications: Infusions:  anticoagulant sodium citrate      tranexamic acid       Scheduled Medications:  sodium chloride    Intravenous Once   calcitRIOL   0.25 mcg Oral BID   Chlorhexidine  Gluconate Cloth  6 each Topical Daily   darbepoetin (ARANESP ) injection - DIALYSIS  100 mcg Subcutaneous Q Thu-1800   enoxaparin  (LOVENOX ) injection  30 mg Subcutaneous Q24H   feeding supplement (GLUCERNA SHAKE)  237 mL Oral BID BM   midodrine   5 mg Oral BID WC   mupirocin  ointment  1 Application Nasal BID    have reviewed scheduled and prn medications.  Physical Exam:      General adult male in bed in no acute distress HEENT normocephalic atraumatic Neck supple trachea midline Lungs clear to auscultation bilaterally normal work of breathing at rest on room air Heart S1S2 no rub Abdomen soft nontender nondistended; obese habitus Extremities bilateral BKA; right leg in wound vac; no pitting edema  Psych blunted affect; no agitation Neuro awake, alert and oriented x 3; gross visual impairment (known per pt) Access RUE AVF with bruit and thrill    Outpatient HD orders:  [facility] at  [facility], TTS, 4 hr, BFR 450, 2k 2 ca, EDW 85.6 kg,  last OP HD on 12/24.RUE AVF.  Mircera 100 mcg on 12/18   [name] 07/05/2024, 12:14 PM  LOS: 7 days

## 2024-07-05 NOTE — NC FL2 (Signed)
 " [place]  MEDICAID [id number] LEVEL OF CARE FORM     IDENTIFICATION  Patient Name: Todd Mccoy Birthdate: 06-15-1971 Sex: male Admission Date (Current Location): 06/28/2024  [facility] and [place] Number:  Producer, Television/film/video and Address:  [facility], [address], [place], [name] [address]      Provider Number: [id number]  Attending Physician Name and Address:  Mosie Ford, MD  Relative Name and Phone Number:       Current Level of Care: Hospital Recommended Level of Care: Skilled Nursing Facility Prior Approval Number:    Date Approved/Denied:   PASRR Number: [id number]  Discharge Plan: SNF    Current Diagnoses: Patient Active Problem List   Diagnosis Date Noted   Necrotizing soft tissue infection 06/30/2024   Osteomyelitis (HCC) 06/28/2024   Elevated alkaline phosphatase level 12/21/2023   Elevated LFTs 12/21/2023   Skin ulcer of right great toe, limited to breakdown of skin (HCC) 10/07/2023   Diabetic polyneuropathy associated with diabetes mellitus due to underlying condition (HCC) 10/05/2023   Other insomnia 10/05/2023   Bilateral carpal tunnel syndrome 10/05/2023   Cubital tunnel syndrome, bilateral 10/05/2023   Chronic pain syndrome 10/05/2023   Encounter for pain management 10/05/2023   Pulmonary HTN (HCC)    Secondary hyperparathyroidism of renal origin 02/24/2023   End stage renal disease (HCC) 02/19/2023   Acquired absence of left leg below knee (HCC) 02/14/2023   Acquired absence of other right toe(s) 02/14/2023   Dependence on renal dialysis 02/14/2023   Hyperlipidemia, unspecified 02/14/2023   Hypertensive heart and chronic kidney disease with heart failure and with stage 5 chronic kidney disease, or end stage renal disease (HCC) 02/14/2023   Nicotine dependence, cigarettes, uncomplicated 02/14/2023   Type 2 diabetes mellitus with diabetic neuropathy, unspecified (HCC) 02/14/2023   Type 2 diabetes mellitus with  diabetic peripheral angiopathy without gangrene (HCC) 02/14/2023   Type 2 diabetes mellitus with unspecified diabetic retinopathy without macular edema (HCC) 02/14/2023   Diarrhea of presumed infectious origin 02/08/2023   Metabolic acidosis 02/08/2023   Anemia of chronic disease 02/08/2023   Acute renal failure superimposed on chronic kidney disease 02/08/2023   Encephalopathy acute 02/07/2023   Shock circulatory (HCC) 02/05/2023   PAD (peripheral artery disease) 10/27/2022   Renal insufficiency    Acute on chronic systolic CHF (congestive heart failure) (HCC) 01/23/2022   Prolonged QT interval 01/23/2022   Chest pain 01/23/2022   Elevated troponin 01/23/2022   Anemia due to chronic kidney disease 01/23/2022   Hypertensive emergency 01/23/2022   Hyponatremia 01/23/2022   Chronic combined systolic (congestive) and diastolic (congestive) heart failure (HCC) 04/07/2021   Chronic renal disease, stage 4, severely decreased glomerular filtration rate between 15-29 mL/min/1.73 square meter (HCC)    Hypertensive urgency    Acute kidney injury superimposed on chronic kidney disease    Acute CHF (congestive heart failure) (HCC) 02/27/2021   Normocytic anemia 02/27/2021   Anasarca 02/27/2021   Abscess of left foot 12/10/2020   S/P BKA (below knee amputation) unilateral, left (HCC) 12/10/2020   Dyslipidemia 12/10/2020   CKD (chronic kidney disease), stage III (HCC) 12/10/2020   HTN (hypertension) 11/12/2020   Cellulitis and abscess of foot    Diabetes mellitus with hyperglycemia (HCC) 09/07/2014   Tobacco abuse 09/07/2014   Abscess of back    Abscess of lower back 09/06/2014   DM2 (diabetes mellitus, type 2) (HCC) 09/06/2014   Sepsis (HCC) 09/06/2014   Scrotal abscess 06/20/2014  Orientation RESPIRATION BLADDER Height & Weight     Self  Normal Continent Weight: 198 lb 10.2 oz (90.1 kg) Height:  5' 10 (177.8 cm)  BEHAVIORAL SYMPTOMS/MOOD NEUROLOGICAL BOWEL NUTRITION STATUS       Continent Diet (please see discharge summary)  AMBULATORY STATUS COMMUNICATION OF NEEDS Skin     Verbally Surgical wounds                       Personal Care Assistance Level of Assistance  Bathing, Feeding, Dressing Bathing Assistance: Limited assistance Feeding assistance: Independent Dressing Assistance: Limited assistance     Functional Limitations Info  Sight, Hearing, Speech Sight Info: Impaired Hearing Info: Adequate Speech Info: Adequate    SPECIAL CARE FACTORS FREQUENCY  PT (By licensed PT), OT (By licensed OT)     PT Frequency: 5x per week OT Frequency: 5x per week            Contractures Contractures Info: Not present    Additional Factors Info  Code Status, Allergies Code Status Info: FULL Allergies Info: None           Current Medications (07/05/2024):  This is the current hospital active medication list Current Facility-Administered Medications  Medication Dose Route Frequency Provider Last Rate Last Admin   0.9 %  sodium chloride  infusion (Manually program via Guardrails IV Fluids)   Intravenous Once Vann, Jessica U, DO       acetaminophen  (TYLENOL ) tablet 650 mg  650 mg Oral Q6H PRN Moore, Michael A, MD   650 mg at 07/03/24 0416   Or   acetaminophen  (TYLENOL ) suppository 650 mg  650 mg Rectal Q6H PRN Georgina Ozell LABOR, MD       alteplase  (CATHFLO ACTIVASE ) injection 2 mg  2 mg Intracatheter Once PRN Moore, Michael A, MD       anticoagulant sodium citrate  solution 5 mL  5 mL Intracatheter PRN Georgina Ozell LABOR, MD       calcitRIOL  (ROCALTROL ) capsule 0.25 mcg  0.25 mcg Oral BID Moore, Michael A, MD   0.25 mcg at 07/05/24 0818   Chlorhexidine  Gluconate Cloth 2 % PADS 6 each  6 each Topical Daily Georgina Ozell LABOR, MD   6 each at 07/05/24 0820   Darbepoetin Alfa  (ARANESP ) injection 100 mcg  100 mcg Subcutaneous Q Thu-1800 Jerrye Katheryn BROCKS, MD       enoxaparin  (LOVENOX ) injection 30 mg  30 mg Subcutaneous Q24H Moore, Michael A, MD   30 mg at 07/05/24 0818    feeding supplement (GLUCERNA SHAKE) (GLUCERNA SHAKE) liquid 237 mL  237 mL Oral BID BM Moore, Michael A, MD   237 mL at 07/05/24 0825   heparin  injection 1,000 Units  1,000 Units Intracatheter PRN Georgina Ozell LABOR, MD       lidocaine  (PF) (XYLOCAINE ) 1 % injection 5 mL  5 mL Intradermal PRN Georgina Ozell LABOR, MD       lidocaine -prilocaine  (EMLA ) cream 1 Application  1 Application Topical PRN Georgina Ozell LABOR, MD       midodrine  (PROAMATINE ) tablet 5 mg  5 mg Oral BID WC Al-Sultani, Anmar, MD       mupirocin  ointment (BACTROBAN ) 2 % 1 Application  1 Application Nasal BID Georgina Ozell LABOR, MD   1 Application at 07/04/24 2123   ondansetron  (ZOFRAN ) tablet 4 mg  4 mg Oral Q6H PRN Georgina Ozell LABOR, MD       Or   ondansetron  (ZOFRAN ) injection 4 mg  4 mg Intravenous Q6H PRN Moore, Michael A, MD       Oral care mouth rinse  15 mL Mouth Rinse PRN Georgina Ozell LABOR, MD       oxyCODONE  (Oxy IR/ROXICODONE ) immediate release tablet 5 mg  5 mg Oral Q4H PRN Moore, Michael A, MD   5 mg at 07/05/24 9170   pentafluoroprop-tetrafluoroeth (GEBAUERS) aerosol 1 Application  1 Application Topical PRN Georgina Ozell LABOR, MD       tranexamic acid  (CYKLOKAPRON ) IVPB 1,000 mg  1,000 mg Intravenous Once Moore, Michael A, MD         Discharge Medications: Please see discharge summary for a list of discharge medications.  Relevant Imaging Results:  Relevant Lab Results:   Additional Information SSN [id number]    Pt receives out[facility]pt HD at [facility] on TTS 11:00 am chair time  Montie LOISE Louder, LCSW     "

## 2024-07-05 NOTE — Plan of Care (Signed)
  Problem: Education: Goal: Knowledge of General Education information will improve Description: Including pain rating scale, medication(s)/side effects and non-pharmacologic comfort measures Outcome: Progressing   Problem: Clinical Measurements: Goal: Respiratory complications will improve Outcome: Progressing Goal: Cardiovascular complication will be avoided Outcome: Progressing   Problem: Coping: Goal: Level of anxiety will decrease Outcome: Progressing   Problem: Pain Managment: Goal: General experience of comfort will improve and/or be controlled Outcome: Progressing   Problem: Safety: Goal: Ability to remain free from injury will improve Outcome: Progressing

## 2024-07-05 NOTE — Progress Notes (Signed)
 " PROGRESS NOTE    Todd Mccoy  FMW:[id number] DOB: [birth year] DOA: 06/28/2024 PCP: Delbert Clam, MD    Brief Narrative:  54 year old PMHx of CHF, T2DM, HLD who presented to ED due to nausea and vomiting over the last several days.  Was undergoing treatment of osteomyelitis of his right foot with podiatry.  Underwent CT abdomen which showed no evidence of obstruction but noted possible cystitis.  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 Antibiotics were discontinued on 12/31 given adequate source control after BKA. PT recommending SNF.   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  - Afebrile, leukocytosis resolved - Discontinued antibiotics - Wound vac in place  - NWB on the right - PT recommending SNF  # Hypotension - resolving - s/p IV albumin , 1 unit pRBCs, and midodrine  - SBP 140-150s - Did not get morning dose. Will discontinue midodrine  today  # ESRD - Nephrology following for routine HD - HD session today  # Acute metabolic encephalopathy - improving - Unclear what his baseline mentation is - Ammonia 15 - CT head showed no acute intracranial abnormality  - Alert and oriented to person, place [name]), and year (2026)  # Anemia of chronic kidney disease - On aranesp  per nephrology - s/p 1 unit pRBC - Hgb relatively stable  # History of hypertension - Home BP meds held given transient hypotension - Will monitor and restart if BP continues to improve and is steadily elevated  # Chronic thrombocytopenia - Continue to monitor  # 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%, G2DD - Management of volume  with HD    Scheduled Meds:  sodium chloride    Intravenous Once   calcitRIOL   0.25 mcg Oral BID   Chlorhexidine  Gluconate Cloth  6 each Topical Daily   darbepoetin (ARANESP ) injection - DIALYSIS  100 mcg Subcutaneous Q Thu-1800   enoxaparin  (LOVENOX ) injection  30 mg Subcutaneous Q24H   feeding supplement (GLUCERNA SHAKE)  237 mL Oral BID BM   midodrine   5 mg Oral BID WC   mupirocin  ointment  1 Application Nasal BID   Continuous Infusions:  anticoagulant sodium citrate      tranexamic acid      PRN Meds:.acetaminophen  **OR** acetaminophen , alteplase , anticoagulant sodium citrate , heparin , lidocaine  (PF), lidocaine -prilocaine , ondansetron  **OR** ondansetron  (ZOFRAN ) IV, mouth rinse, oxyCODONE , pentafluoroprop-tetrafluoroeth  Current Outpatient Medications  Medication Instructions   acetaminophen  (TYLENOL ) 500 mg, Oral, 2 times daily   amLODipine  (NORVASC ) 10 mg, Oral, Daily   atorvastatin  (LIPITOR) 20 mg, Oral, Daily   Baclofen  5 mg, Oral, 2 times daily PRN   calcitRIOL  (ROCALTROL ) 0.25 mcg, Oral, 2 times daily   carvedilol  (COREG ) 25 mg, Oral, 2 times daily with meals   docusate sodium  (COLACE) 100 mg, Oral, 2 times daily PRN   doxycycline  (VIBRA -TABS) 100 mg, Oral, 2 times daily   ferrous sulfate  325 mg, Oral, Daily with breakfast   furosemide  (LASIX ) 40 mg, Oral, 2 times daily   gabapentin  (NEURONTIN ) 600 mg, Oral, 2 times daily   gentamicin ointment (GARAMYCIN) 0.1 % SMARTSIG:sparingly Topical   hydrALAZINE  (APRESOLINE ) 50 mg, 3 times daily   hydrOXYzine  (ATARAX ) 50 mg,  Oral, 3 times daily PRN   isosorbide  mononitrate (IMDUR ) 60 mg, Oral, Daily   Lantus  SoloStar 5 Units, Subcutaneous, Daily at bedtime   lidocaine  (LIDODERM ) 5 % 1 patch, Transdermal, Every 24 hours, Remove & Discard patch within 12 hours or as directed by MD   magnesium  oxide (MAG-OX) 400 mg, Oral, 2 times daily   minocycline (MINOCIN) 100 mg, 2 times daily   multivitamin (RENA-VIT) TABS tablet 1 tablet, Oral,  Daily at bedtime   nortriptyline  (PAMELOR ) 50 mg, Oral, Daily at bedtime, Start with 1 capsule at nighttime for 1 week, then if no side effects increase to 2 capsules at nighttime   omeprazole (PRILOSEC) 20 mg, Oral, Daily   polyethylene glycol (MIRALAX  / GLYCOLAX ) 17 g, Oral, Daily PRN   sevelamer (RENAGEL) 800 mg, Oral, 3 times daily with meals   torsemide  (DEMADEX ) 80 mg, Oral, Daily    DVT prophylaxis: SCDs Start: 06/30/24 1641 enoxaparin  (LOVENOX ) injection 30 mg Start: 06/29/24 1000 SCDs Start: 06/28/24 2305   Code Status:   Code Status: Full Code  Family Communication: Updated patient's niece on the phone  Disposition Plan: PT recommending SNF, TOC consulted PT - Follow Up Recommendations: Skilled nursing-short term rehab (<3 hours/day) - PT equipment: None recommended by PT OT -   -    Level of care: Progressive  Consultants:  Nephrology, orthopedic surgery, podiatry   Subjective: NAEO. Examined at bedside. Much more awake today. Oriented to person, place [name]), and year (2026). Not been eating a lot per [employer]. He states it's because of it being hospital food.   Objective: Vitals:   07/04/24 0831 07/04/24 1938 07/04/24 2335 07/05/24 0344  BP: 138/81 (!) 146/86 (!) 141/80 (!) 151/83  Pulse: 75     Resp: 20 16 14 20   Temp: 98 F (36.7 C) 97.9 F (36.6 C) 98.2 F (36.8 C) 97.7 F (36.5 C)  TempSrc: Oral Oral Oral Oral  SpO2:      Weight:      Height:        Intake/Output Summary (Last 24 hours) at 07/05/2024 0908 Last data filed at 07/05/2024 9287 Gross per 24 hour  Intake --  Output 50 ml  Net -50 ml   Filed Weights   07/01/24 1639 07/01/24 1943 07/03/24 0754  Weight: 86 kg 86 kg 90.1 kg    Examination:  Gen: NAD, A&Ox3 person place and time. More awake and interactive today HEENT: NCAT Neck: Supple CV: RRR, no murmurs Resp: normal WOB, CTAB anteriorly Abd: Soft, NTND, no guarding Ext: Bilateral BKA, RLE wound vac in place, RUE AVF Skin:  Warm, dry, no rashes/lesions Neuro: impaired vision, no focal deficits noted    Data Reviewed: I have personally reviewed following labs and imaging studies  CBC: Recent Labs  Lab 06/28/24 1833 06/29/24 0500 07/01/24 0613 07/01/24 1104 07/02/24 0336 07/03/24 0503 07/04/24 0423  WBC 30.2*   < > 18.4* 16.6* 11.7* 12.7* 9.9  NEUTROABS 27.4*  --   --  14.4*  --   --   --   HGB 9.4*   < > 9.3* 7.5* 8.1* 8.7* 8.7*  HCT 31.1*   < > 30.4* 24.3* 26.1* 27.9* 27.8*  MCV 93.7   < > 91.8 93.5 89.7 90.9 89.1  PLT 112*   < > 93* 82* 69* 88* 90*   < > = values in this interval not displayed.   Basic Metabolic Panel: Recent Labs  Lab 06/30/24 [phone] 06/30/24 0912 07/01/24 9386 07/02/24  9663 07/03/24 0503 07/04/24 0423  NA 120*  --  133* 133* 133* 133*  K >7.5* 4.5 3.9 3.8 3.9 3.6  CL 96*  --  93* 94* 92* 93*  CO2 22  --  23 23 24 26   GLUCOSE 83  --  100* 94 102* 127*  BUN 31*  --  52* 36* 45* 28*  CREATININE 5.43*  --  7.44* 5.18* 6.12* 4.20*  CALCIUM  7.5*  --  9.3 8.8* 9.4 8.7*  MG 1.4*  --   --   --   --   --   PHOS 2.3*  --   --   --  4.4  --    GFR: Estimated Creatinine Clearance: 23.5 mL/min (A) (by C-G formula based on SCr of 4.2 mg/dL (H)). Liver Function Tests: Recent Labs  Lab 06/28/24 1833  AST 24  ALT <5  ALKPHOS 165*  BILITOT 1.7*  PROT 6.4*  ALBUMIN  2.4*   No results for input(s): LIPASE, AMYLASE in the last 168 hours. Recent Labs  Lab 06/28/24 2036 07/04/24 1422  AMMONIA 21 15   Coagulation Profile: Recent Labs  Lab 06/29/24 0500  INR 1.8*   Cardiac Enzymes: No results for input(s): CKTOTAL, CKMB, CKMBINDEX, TROPONINI in the last 168 hours. BNP (last 3 results) No results for input(s): PROBNP in the last 8760 hours. HbA1C: No results for input(s): HGBA1C in the last 72 hours. CBG: Recent Labs  Lab 07/01/24 1010 07/01/24 1542 07/01/24 1954 07/02/24 1203 07/02/24 1600  GLUCAP 103* 101* 87 99 90   Lipid Profile: No  results for input(s): CHOL, HDL, LDLCALC, TRIG, CHOLHDL, LDLDIRECT in the last 72 hours. Thyroid Function Tests: No results for input(s): TSH, T4TOTAL, FREET4, T3FREE, THYROIDAB in the last 72 hours. Anemia Panel: No results for input(s): VITAMINB12, FOLATE, FERRITIN, TIBC, IRON , RETICCTPCT in the last 72 hours. Sepsis Labs: No results for input(s): PROCALCITON, LATICACIDVEN in the last 168 hours.  Recent Results (from the past 240 hours)  Resp panel by RT-PCR (RSV, Flu A&B, Covid) Anterior Nasal Swab     Status: None   Collection Time: 06/28/24  6:33 PM   Specimen: Anterior Nasal Swab  Result Value Ref Range Status   SARS Coronavirus 2 by RT PCR NEGATIVE NEGATIVE Final    Comment: (NOTE) SARS-CoV-2 target nucleic acids are NOT DETECTED.  The SARS-CoV-2 RNA is generally detectable in upper respiratory specimens during the acute phase of infection. The lowest concentration of SARS-CoV-2 viral copies this assay can detect is 138 copies/mL. A negative result does not preclude SARS-Cov-2 infection and should not be used as the sole basis for treatment or other patient management decisions. A negative result may occur with  improper specimen collection/handling, submission of specimen other than nasopharyngeal swab, presence of viral mutation(s) within the areas targeted by this assay, and inadequate number of viral copies(<138 copies/mL). A negative result must be combined with clinical observations, patient history, and epidemiological information. The expected result is Negative.  Fact Sheet for Patients:  bloggercourse.com  Fact Sheet for Healthcare Providers:  seriousbroker.it  This test is no t yet approved or cleared by the [place]  FDA and  has been authorized for detection and/or diagnosis of SARS-CoV-2 by FDA under an Emergency Use Authorization (EUA). This EUA will remain  in  effect (meaning this test can be used) for the duration of the COVID-19 declaration under Section 564(b)(1) of the Act, 21 U.S.C.section 360bbb-3(b)(1), unless the authorization is terminated  or revoked sooner.  Influenza A by PCR NEGATIVE NEGATIVE Final   Influenza B by PCR NEGATIVE NEGATIVE Final    Comment: (NOTE) The Xpert Xpress SARS-CoV-2/FLU/RSV plus assay is intended as an aid in the diagnosis of influenza from Nasopharyngeal swab specimens and should not be used as a sole basis for treatment. Nasal washings and aspirates are unacceptable for Xpert Xpress SARS-CoV-2/FLU/RSV testing.  Fact Sheet for Patients: bloggercourse.com  Fact Sheet for Healthcare Providers: seriousbroker.it  This test is not yet approved or cleared by the [place]  FDA and has been authorized for detection and/or diagnosis of SARS-CoV-2 by FDA under an Emergency Use Authorization (EUA). This EUA will remain in effect (meaning this test can be used) for the duration of the COVID-19 declaration under Section 564(b)(1) of the Act, 21 U.S.C. section 360bbb-3(b)(1), unless the authorization is terminated or revoked.     Resp Syncytial Virus by PCR NEGATIVE NEGATIVE Final    Comment: (NOTE) Fact Sheet for Patients: bloggercourse.com  Fact Sheet for Healthcare Providers: seriousbroker.it  This test is not yet approved or cleared by the [place]  FDA and has been authorized for detection and/or diagnosis of SARS-CoV-2 by FDA under an Emergency Use Authorization (EUA). This EUA will remain in effect (meaning this test can be used) for the duration of the COVID-19 declaration under Section 564(b)(1) of the Act, 21 U.S.C. section 360bbb-3(b)(1), unless the authorization is terminated or revoked.  Performed at [facility], [address], [place], [name] [address]    Urine Culture     Status: Abnormal   Collection Time: 06/28/24  9:33 PM   Specimen: Urine, Random  Result Value Ref Range Status   Specimen Description   Final    URINE, RANDOM Performed at [facility], [address], [place], [name] [address]    Special Requests   Final    NONE Reflexed from [id number] Performed at [facility], [address], [place], [name] [address]    Culture >=100,000 COLONIES/mL ESCHERICHIA COLI (A)  Final   Report Status 06/30/2024 FINAL  Final   Organism ID, Bacteria ESCHERICHIA COLI (A)  Final      Susceptibility   Escherichia coli - MIC*    AMPICILLIN >=32 RESISTANT Resistant     CEFAZOLIN  (URINE) Value in next row Sensitive      8 SENSITIVEThis is a modified FDA-approved test that has been validated and its performance characteristics determined by the reporting laboratory.  This laboratory is certified under the Clinical Laboratory Improvement Amendments CLIA as qualified to perform high complexity clinical laboratory testing.    CEFEPIME  Value in next row Sensitive      8 SENSITIVEThis is a modified FDA-approved test that has been validated and its performance characteristics determined by the reporting laboratory.  This laboratory is certified under the Clinical Laboratory Improvement Amendments CLIA as qualified to perform high complexity clinical laboratory testing.    ERTAPENEM Value in next row Sensitive      8 SENSITIVEThis is a modified FDA-approved test that has been validated and its performance characteristics determined by the reporting laboratory.  This laboratory is certified under the Clinical Laboratory Improvement Amendments CLIA as qualified to perform high complexity clinical laboratory testing.    CEFTRIAXONE  Value in next row Sensitive      8 SENSITIVEThis is a modified FDA-approved test that has been validated and its performance characteristics determined by the reporting laboratory.  This  laboratory is certified under the Clinical Laboratory Improvement Amendments CLIA  as qualified to perform high complexity clinical laboratory testing.    CIPROFLOXACIN Value in next row Resistant      8 SENSITIVEThis is a modified FDA-approved test that has been validated and its performance characteristics determined by the reporting laboratory.  This laboratory is certified under the Clinical Laboratory Improvement Amendments CLIA as qualified to perform high complexity clinical laboratory testing.    GENTAMICIN Value in next row Sensitive      8 SENSITIVEThis is a modified FDA-approved test that has been validated and its performance characteristics determined by the reporting laboratory.  This laboratory is certified under the Clinical Laboratory Improvement Amendments CLIA as qualified to perform high complexity clinical laboratory testing.    NITROFURANTOIN Value in next row Sensitive      8 SENSITIVEThis is a modified FDA-approved test that has been validated and its performance characteristics determined by the reporting laboratory.  This laboratory is certified under the Clinical Laboratory Improvement Amendments CLIA as qualified to perform high complexity clinical laboratory testing.    TRIMETH /SULFA  Value in next row Sensitive      8 SENSITIVEThis is a modified FDA-approved test that has been validated and its performance characteristics determined by the reporting laboratory.  This laboratory is certified under the Clinical Laboratory Improvement Amendments CLIA as qualified to perform high complexity clinical laboratory testing.    AMPICILLIN/SULBACTAM Value in next row Resistant      8 SENSITIVEThis is a modified FDA-approved test that has been validated and its performance characteristics determined by the reporting laboratory.  This laboratory is certified under the Clinical Laboratory Improvement Amendments CLIA as qualified to perform high complexity clinical laboratory testing.     PIP/TAZO Value in next row Sensitive      <=4 SENSITIVEThis is a modified FDA-approved test that has been validated and its performance characteristics determined by the reporting laboratory.  This laboratory is certified under the Clinical Laboratory Improvement Amendments CLIA as qualified to perform high complexity clinical laboratory testing.    MEROPENEM Value in next row Sensitive      <=4 SENSITIVEThis is a modified FDA-approved test that has been validated and its performance characteristics determined by the reporting laboratory.  This laboratory is certified under the Clinical Laboratory Improvement Amendments CLIA as qualified to perform high complexity clinical laboratory testing.    * >=100,000 COLONIES/mL ESCHERICHIA COLI  Surgical pcr screen     Status: Abnormal   Collection Time: 06/30/24  1:17 PM   Specimen: Nasal Mucosa; Nasal Swab  Result Value Ref Range Status   MRSA, PCR NEGATIVE NEGATIVE Final   Staphylococcus aureus POSITIVE (A) NEGATIVE Final    Comment: (NOTE) The Xpert SA Assay (FDA approved for NASAL specimens in patients 73 years of age and older), is one component of a comprehensive surveillance program. It is not intended to diagnose infection nor to guide or monitor treatment. Performed at [facility], [address], [place], [name] [address]   Aerobic/Anaerobic Culture w Gram Stain (surgical/deep wound)     Status: None (Preliminary result)   Collection Time: 06/30/24  1:50 PM   Specimen: Path fluid; Body Fluid  Result Value Ref Range Status   Specimen Description FLUID  Final   Special Requests RIGHT FOOT GANGRENE  Final   Gram Stain   Final    FEW WBC PRESENT, PREDOMINANTLY PMN MODERATE GRAM POSITIVE COCCI FEW GRAM NEGATIVE RODS Performed at [facility], [address], [place], [name] [address]    Culture  Final    MODERATE ESCHERICHIA COLI FEW ENTEROCOCCUS FAECALIS NO ANAEROBES ISOLATED; CULTURE IN PROGRESS FOR 5 DAYS    Report  Status PENDING  Incomplete   Organism ID, Bacteria ESCHERICHIA COLI  Final   Organism ID, Bacteria ENTEROCOCCUS FAECALIS  Final      Susceptibility   Escherichia coli - MIC*    AMPICILLIN >=32 RESISTANT Resistant     CEFAZOLIN  (NON-URINE) 8 RESISTANT Resistant     CEFEPIME  <=0.12 SENSITIVE Sensitive     ERTAPENEM <=0.12 SENSITIVE Sensitive     CEFTRIAXONE  <=0.25 SENSITIVE Sensitive     CIPROFLOXACIN >=4 RESISTANT Resistant     GENTAMICIN <=1 SENSITIVE Sensitive     MEROPENEM <=0.25 SENSITIVE Sensitive     TRIMETH /SULFA  <=20 SENSITIVE Sensitive     AMPICILLIN/SULBACTAM >=32 RESISTANT Resistant     PIP/TAZO Value in next row Sensitive      <=4 SENSITIVEThis is a modified FDA-approved test that has been validated and its performance characteristics determined by the reporting laboratory.  This laboratory is certified under the Clinical Laboratory Improvement Amendments CLIA as qualified to perform high complexity clinical laboratory testing.    * MODERATE ESCHERICHIA COLI   Enterococcus faecalis - MIC*    AMPICILLIN Value in next row Sensitive      <=4 SENSITIVEThis is a modified FDA-approved test that has been validated and its performance characteristics determined by the reporting laboratory.  This laboratory is certified under the Clinical Laboratory Improvement Amendments CLIA as qualified to perform high complexity clinical laboratory testing.    VANCOMYCIN  Value in next row Sensitive      <=4 SENSITIVEThis is a modified FDA-approved test that has been validated and its performance characteristics determined by the reporting laboratory.  This laboratory is certified under the Clinical Laboratory Improvement Amendments CLIA as qualified to perform high complexity clinical laboratory testing.    GENTAMICIN SYNERGY Value in next row Resistant      <=4 SENSITIVEThis is a modified FDA-approved test that has been validated and its performance characteristics determined by the reporting  laboratory.  This laboratory is certified under the Clinical Laboratory Improvement Amendments CLIA as qualified to perform high complexity clinical laboratory testing.    * FEW ENTEROCOCCUS FAECALIS     Radiology Studies: CT HEAD WO CONTRAST ( ) Result Date: 07/04/2024 EXAM: CT HEAD WITHOUT CONTRAST 07/04/2024 08:30:11 PM TECHNIQUE: CT of the head was performed without the administration of intravenous contrast. Automated exposure control, iterative reconstruction, and/or weight based adjustment of the mA/kV was utilized to reduce the radiation dose to as low as reasonably achievable. COMPARISON: 12/23/2023 CLINICAL HISTORY: altered mental status FINDINGS: BRAIN AND VENTRICLES: No acute hemorrhage. No evidence of acute infarct. No hydrocephalus. No extra-axial collection. No mass effect or midline shift. Atherosclerotic calcifications within the cavernous internal carotid and vertebral arteries. ORBITS: Right retinal tamponade procedure. Unchanged left sub-retinal effusion. SINUSES: No acute abnormality. SOFT TISSUES AND SKULL: No acute soft tissue abnormality. No skull fracture. IMPRESSION: 1. No acute intracranial abnormality. Electronically signed by: Franky Stanford MD 07/04/2024 11:28 PM EST RP Workstation: [id number]      Unresulted Labs (From admission, onward)     Start     Ordered   07/05/24 0500  CBC  Tomorrow morning,   R       Question:  Specimen collection method  Answer:  Lab=Lab collect   07/04/24 1936   07/05/24 0500  Renal function panel  Tomorrow morning,   R       Question:  Specimen  collection method  Answer:  Lab=Lab collect   07/04/24 1936             LOS:  LOS: 7 days   Time Spent: 45 minutes  Maalik Pinn Al-Sultani, MD [facility]  If 7PM-7AM, please contact night-coverage  07/05/2024, 9:08 AM      "

## 2024-07-05 NOTE — Progress Notes (Signed)
 Pt's son called. RN gave him an update. Pt currently in HD. Son stated he would be coming up tomorrow to visit.

## 2024-07-05 NOTE — Progress Notes (Signed)
 OT Cancellation Note  Patient Details Name: Todd Mccoy MRN: [id number] DOB: 1970-08-07   Cancelled Treatment:    Reason Eval/Treat Not Completed: Patient at procedure or test/ unavailable (HD; will follow up on a later date)  [facility] 07/05/2024, 2:55 PM Kreg Sink, OT/L   Acute OT Clinical Specialist Acute Rehabilitation Services Pager [phone] Office [phone]

## 2024-07-06 DIAGNOSIS — M7989 Other specified soft tissue disorders: Secondary | ICD-10-CM | POA: Diagnosis not present

## 2024-07-06 DIAGNOSIS — M86071 Acute hematogenous osteomyelitis, right ankle and foot: Secondary | ICD-10-CM | POA: Diagnosis not present

## 2024-07-06 DIAGNOSIS — I959 Hypotension, unspecified: Secondary | ICD-10-CM | POA: Diagnosis not present

## 2024-07-06 LAB — CBC
HCT: 28.8 % — ABNORMAL LOW (ref 39.0–52.0)
Hemoglobin: 9 g/dL — ABNORMAL LOW (ref 13.0–17.0)
MCH: 28 pg (ref 26.0–34.0)
MCHC: 31.3 g/dL (ref 30.0–36.0)
MCV: 89.7 fL (ref 80.0–100.0)
Platelets: 86 K/uL — ABNORMAL LOW (ref 150–400)
RBC: 3.21 MIL/uL — ABNORMAL LOW (ref 4.22–5.81)
RDW: 17 % — ABNORMAL HIGH (ref 11.5–15.5)
WBC: 13.8 K/uL — ABNORMAL HIGH (ref 4.0–10.5)
nRBC: 0.1 % (ref 0.0–0.2)

## 2024-07-06 MED ORDER — DARBEPOETIN ALFA 100 MCG/0.5ML IJ SOSY
100.0000 ug | PREFILLED_SYRINGE | INTRAMUSCULAR | Status: DC
  Administered 2024-07-06: 100 ug via SUBCUTANEOUS
  Filled 2024-07-06: qty 0.5

## 2024-07-06 MED ORDER — CHLORHEXIDINE GLUCONATE CLOTH 2 % EX PADS
6.0000 | MEDICATED_PAD | Freq: Every day | CUTANEOUS | Status: DC
  Administered 2024-07-07: 6 via TOPICAL

## 2024-07-06 NOTE — Evaluation (Signed)
 Occupational Therapy Evaluation Patient Details Name: Todd Mccoy MRN: [id number] DOB: [birth year] Today's Date: 07/06/2024   History of Present Illness   54 y.o. male who presented to the ED 06/28/24 due to nausea and vomiting as well as increased AMS. 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 Pt completed supine to sitting with CGA- min assist but then required min-mod 1-2 to reposition to EOB. Pt then was able to lateral lean R and L side to then completed peri care once set up with washcloth but needed min-mod assist as could not determine if completely clean. Pt then completed A-P transfer to chair with mod x2 and max cues. Pt then was set up to call son and requested to bring in prothesis. Patient will benefit from continued inpatient follow up therapy, <3 hours/day.     If plan is discharge home, recommend the following:   Two people to help with walking and/or transfers;Two people to help with bathing/dressing/bathroom;Assistance with cooking/housework;Direct supervision/assist for medications management;Direct supervision/assist for financial management;Assist for transportation;Help with stairs or ramp for entrance;Supervision due to cognitive status     Functional Status Assessment   Patient has had a recent decline in their functional status and demonstrates the ability to make significant improvements in function in a reasonable and predictable amount of time.     Equipment Recommendations    (TBD at next venue)     Recommendations for Other Services         Precautions/Restrictions   Precautions Precautions:  Fall Recall of Precautions/Restrictions: Impaired Restrictions Weight Bearing Restrictions Per Provider Order: Yes RLE Weight Bearing Per Provider Order: Non weight bearing Other Position/Activity Restrictions: spoke to nursing about limb protector?     Mobility Bed Mobility Overal bed mobility: Needs Assistance Bed Mobility: Supine to Sit     Supine to sit: Min assist, Mod assist Sit to supine: Min assist, HOB elevated, Used rails   General bed mobility comments: more due to low vision and could not understand where they were within the bed    Transfers Overall transfer level: Needs assistance                 General transfer comment: pt completed A-P transfer to chair with mod x2 assist with pad and set up of BUE and max cues in direction while cueing to ensure they do not WB into RLE      Balance Overall balance assessment: Needs assistance Sitting-balance support: Bilateral upper extremity supported, No upper extremity supported, Single extremity supported Sitting balance-Leahy Scale: Fair Sitting balance - Comments: able to lean side to side to complete peri care                                   ADL either performed or assessed with clinical judgement   ADL Overall ADL's : Needs assistance/impaired Eating/Feeding: Set up;Minimal assistance;Sitting Eating/Feeding Details (indicate cue type and reason): due to vision, needed some set up in hand Grooming: Wash/dry face;Set up;Sitting   Upper Body Bathing: Contact guard assist;Sitting   Lower Body Bathing: Moderate assistance;Sitting/lateral leans;Maximal assistance Lower  Body Bathing Details (indicate cue type and reason): lateral leans at EOB Upper Body Dressing : Contact guard assist;Sitting   Lower Body Dressing: Moderate assistance;Maximal assistance;Sitting/lateral leans       Toileting- Clothing Manipulation and Hygiene: Moderate assistance;Sitting/lateral lean Toileting - Clothing  Manipulation Details (indicate cue type and reason): while at EOB             Vision   Additional Comments: Pt reporting they can see shadows but never tracked therapist in session. Reported they normally watch TV but then asking where it is in the room?     Perception Perception: Impaired Preception Impairment Details: Figure ground, Spatial orientation, Topographical orientation Perception-Other Comments: due to low vision   Praxis         Pertinent Vitals/Pain Pain Assessment Pain Assessment: Faces Faces Pain Scale: Hurts a little bit Pain Location: RLE Pain Descriptors / Indicators: Guarding Pain Intervention(s): Limited activity within patient's tolerance, Monitored during session, Repositioned     Extremity/Trunk Assessment Upper Extremity Assessment Upper Extremity Assessment: Generalized weakness   Lower Extremity Assessment Lower Extremity Assessment: Defer to PT evaluation       Communication Communication Communication: Impaired Factors Affecting Communication: Reduced clarity of speech;Difficulty expressing self   Cognition Arousal: Alert Behavior During Therapy: Flat affect Cognition: Cognition impaired   Orientation impairments: Situation Awareness: Intellectual awareness impaired, Online awareness impaired   Attention impairment (select first level of impairment): Selective attention Executive functioning impairment (select all impairments): Sequencing, Reasoning, Problem solving OT - Cognition Comments: pt often needed cues to remain on tasks                 Following commands: Impaired Following commands impaired: Follows one step commands inconsistently     Cueing  General Comments   Cueing Techniques: Verbal cues;Tactile cues      Exercises     Shoulder Instructions      Home Living Family/patient expects to be discharged to:: Private residence Living Arrangements: Parent;Other relatives;Children Available Help at  Discharge: Family;Available PRN/intermittently Type of Home: House Home Access: Ramped entrance     Home Layout: Able to live on main level with bedroom/bathroom     Bathroom Shower/Tub: Tub/shower unit   Bathroom Toilet: Standard Bathroom Accessibility: Yes   Home Equipment: Agricultural Consultant (2 wheels);Rollator (4 wheels);BSC/3in1;Tub bench;Wheelchair - manual   Additional Comments: lives with mom, son and niece      Prior Functioning/Environment Prior Level of Function : Needs assist             Mobility Comments: reports he uses a wheelchair      OT Problem List: Decreased strength;Decreased activity tolerance;Impaired balance (sitting and/or standing);Impaired vision/perception;Decreased safety awareness;Decreased cognition;Decreased knowledge of use of DME or AE;Pain   OT Treatment/Interventions: Self-care/ADL training;Therapeutic exercise;DME and/or AE instruction;Therapeutic activities;Patient/family education;Balance training      OT Goals(Current goals can be found in the care plan section)   Acute Rehab OT Goals Patient Stated Goal: to sit for a bit OT Goal Formulation: With patient Time For Goal Achievement: 07/20/24 Potential to Achieve Goals: Fair   OT Frequency:  Min 2X/week    Co-evaluation              AM-PAC OT 6 Clicks Daily Activity     Outcome Measure Help from another person eating meals?: A Little Help from another person taking care of personal grooming?: A Little Help from another person toileting, which includes using toliet, bedpan, or urinal?: A Lot Help from another person  bathing (including washing, rinsing, drying)?: A Lot Help from another person to put on and taking off regular upper body clothing?: A Little Help from another person to put on and taking off regular lower body clothing?: A Lot 6 Click Score: 15   End of Session Equipment Utilized During Treatment: Gait belt Nurse Communication: Mobility status (limb  protector)  Activity Tolerance: Patient tolerated treatment well Patient left: in chair;with call bell/phone within reach;with chair alarm set  OT Visit Diagnosis: Unsteadiness on feet (R26.81);Other abnormalities of gait and mobility (R26.89);Muscle weakness (generalized) (M62.81);Pain Pain - Right/Left: Right Pain - part of body: Leg                Time: 1025-1103 OT Time Calculation (min): 38 min Charges:  OT General Charges $OT Visit: 1 Visit OT Evaluation $OT Eval Low Complexity: 1 Low OT Treatments $Self Care/Home Management : 23-37 mins  Warrick POUR OTR/L  Acute Rehab Services  [phone] office number   Warrick Berber 07/06/2024, 11:16 AM

## 2024-07-06 NOTE — Progress Notes (Signed)
 " PROGRESS NOTE    Todd Mccoy  FMW:[id number] DOB: September 02, 1970 DOA: 06/28/2024 PCP: Delbert Clam, MD    Brief Narrative:  54 year old PMHx of CHF, T2DM, HLD who presented to ED due to nausea and vomiting over the last several days.  Was undergoing treatment of osteomyelitis of his right foot with podiatry.  Underwent CT abdomen which showed no evidence of obstruction but noted possible cystitis.  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 Antibiotics were discontinued on 12/31 given adequate source control after BKA. PT recommending SNF. However, patient reports wanting to go home.   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  - Afebrile, leukocytosis resolved - Discontinued antibiotics - Wound vac in place  - NWB on the right - PT recommending SNF but declining, wants to return home  # Hypotension - resolved - s/p IV albumin , 1 unit pRBCs, and midodrine  - SBP 140-150s - Midodrine  discontinued 1/1, last dose was 12/31  # ESRD - Nephrology following for routine HD - HD session today  # Acute metabolic encephalopathy - resolved - Ammonia 15 - CT head showed no acute intracranial abnormality  - Alert and oriented to person, place [name]), year (2026), and situation.   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%, G2DD - Management of volume with HD    Scheduled Meds:  calcitRIOL   0.25 mcg Oral BID   darbepoetin (ARANESP ) injection - DIALYSIS  100 mcg Subcutaneous Q Fri-1800   enoxaparin  (LOVENOX ) injection  30 mg Subcutaneous Q24H   feeding supplement (GLUCERNA SHAKE)  237 mL Oral BID BM   polyethylene glycol  17 g Oral Daily   senna  1 tablet Oral Daily   Continuous Infusions:  anticoagulant sodium citrate      PRN Meds:.acetaminophen  **OR** acetaminophen , alteplase , anticoagulant sodium citrate , heparin , lidocaine  (PF), lidocaine -prilocaine , ondansetron  **OR** ondansetron  (ZOFRAN ) IV, mouth rinse, oxyCODONE , pentafluoroprop-tetrafluoroeth  Current Outpatient Medications  Medication Instructions   acetaminophen  (TYLENOL ) 500 mg, Oral, 2 times daily   amLODipine  (NORVASC ) 10 mg, Oral, Daily   atorvastatin  (LIPITOR) 20 mg, Oral, Daily   Baclofen  5 mg, Oral, 2 times daily PRN   calcitRIOL  (ROCALTROL ) 0.25 mcg, Oral, 2 times daily   carvedilol  (COREG ) 25 mg, Oral, 2 times daily with meals   docusate sodium  (COLACE) 100 mg, Oral, 2 times daily PRN   doxycycline  (VIBRA -TABS) 100 mg, Oral, 2 times daily   ferrous sulfate  325 mg, Oral, Daily with breakfast   furosemide  (LASIX ) 40 mg, Oral, 2 times daily   gabapentin  (NEURONTIN ) 600 mg, Oral, 2 times daily   gentamicin ointment (GARAMYCIN) 0.1 % SMARTSIG:sparingly Topical   hydrALAZINE  (APRESOLINE ) 50 mg, 3 times daily   hydrOXYzine  (ATARAX ) 50 mg, Oral, 3 times daily PRN   isosorbide  mononitrate (IMDUR ) 60 mg, Oral, Daily   Lantus  SoloStar  5 Units, Subcutaneous, Daily at bedtime   lidocaine  (LIDODERM ) 5 % 1 patch, Transdermal, Every 24 hours, Remove & Discard patch within 12 hours or as directed by MD   magnesium  oxide (MAG-OX) 400 mg, Oral, 2 times daily   minocycline (MINOCIN) 100 mg, 2 times daily   multivitamin (RENA-VIT) TABS tablet 1 tablet, Oral, Daily at bedtime   nortriptyline  (PAMELOR ) 50 mg, Oral, Daily at bedtime,  Start with 1 capsule at nighttime for 1 week, then if no side effects increase to 2 capsules at nighttime   omeprazole (PRILOSEC) 20 mg, Oral, Daily   polyethylene glycol (MIRALAX  / GLYCOLAX ) 17 g, Oral, Daily PRN   sevelamer (RENAGEL) 800 mg, Oral, 3 times daily with meals   torsemide  (DEMADEX ) 80 mg, Oral, Daily    DVT prophylaxis: SCDs Start: 06/30/24 1641 enoxaparin  (LOVENOX ) injection 30 mg Start: 06/29/24 1000 SCDs Start: 06/28/24 2305   Code Status:   Code Status: Full Code  Family Communication: Spoke to patient's son over the phone. His preference is that his father goes to rehab, but he believes that his father's mind is set on going home. He will try talking to him again, but does not feel confident that he will change his mind.  Disposition Plan: PT recommending SNF, TOC following, but patient wants to go home instead PT - Follow Up Recommendations: Skilled nursing-short term rehab (<3 hours/day) - PT equipment: None recommended by PT OT - Follow Up Recommendations: Skilled nursing-short term rehab (<3 hours/day) -    Level of care: Progressive  Consultants:  Nephrology, orthopedic surgery, podiatry   Subjective: NAEO.  Patient was seen sitting in bedside chair.  He is remarkably improved from an encephalopathy standpoint.  He is oriented x 4 today and conversational. He states a clear preference to not go to a rehab facility stating that he has already dealt with 1 amputation and feels confident that he can manage at home. He is open to having HH PT but is adamant about not going to rehab. He states he already spoke with his son about this who was okay with his decision.   Attempted to call his son but he did not answer his phone.   Objective: Vitals:   07/06/24 0352 07/06/24 0408 07/06/24 0646 07/06/24 1226  BP:  (!) 140/81  (!) 142/89  Pulse:    73  Resp: (!) 22 20 (!) 21 20  Temp:  97.8 F (36.6 C)    TempSrc:  Oral    SpO2:      Weight:      Height:         Intake/Output Summary (Last 24 hours) at 07/06/2024 1444 Last data filed at 07/06/2024 0200 Gross per 24 hour  Intake 380 ml  Output 1515 ml  Net -1135 ml   Filed Weights   07/03/24 0754 07/05/24 1428 07/05/24 1823  Weight: 90.1 kg 83.2 kg 81.7 kg    Examination:  Gen: NAD, A&Ox4 Significantly more awake and interactive today. HEENT: NCAT Neck: Supple CV: RRR, no murmurs Resp: normal WOB, CTAB anteriorly Abd: Soft, NTND, no guarding Ext: Bilateral BKA, RLE wound vac in place, RUE AVF Skin: Warm, dry, no rashes/lesions Neuro: impaired vision, no focal deficits noted   Data Reviewed: I have personally reviewed following labs and imaging studies  CBC: Recent Labs  Lab 07/01/24 1104 07/02/24 0336 07/03/24 0503 07/04/24 0423 07/05/24 1521  WBC 16.6* 11.7* 12.7* 9.9 13.4*  NEUTROABS 14.4*  --   --   --   --  HGB 7.5* 8.1* 8.7* 8.7* 8.9*  HCT 24.3* 26.1* 27.9* 27.8* 28.7*  MCV 93.5 89.7 90.9 89.1 89.7  PLT 82* 69* 88* 90* 89*   Basic Metabolic Panel: Recent Labs  Lab 06/30/24 0619 06/30/24 0912 07/01/24 0613 07/02/24 0336 07/03/24 0503 07/04/24 0423 07/05/24 1521  NA 120*  --  133* 133* 133* 133* 134*  K >7.5*   < > 3.9 3.8 3.9 3.6 3.9  CL 96*  --  93* 94* 92* 93* 93*  CO2 22  --  23 23 24 26 26   GLUCOSE 83  --  100* 94 102* 127* 101*  BUN 31*  --  52* 36* 45* 28* 39*  CREATININE 5.43*  --  7.44* 5.18* 6.12* 4.20* 5.24*  CALCIUM  7.5*  --  9.3 8.8* 9.4 8.7* 8.9  MG 1.4*  --   --   --   --   --   --   PHOS 2.3*  --   --   --  4.4  --  4.4   < > = values in this interval not displayed.   GFR: Estimated Creatinine Clearance: 17.2 mL/min (A) (by C-G formula based on SCr of 5.24 mg/dL (H)). Liver Function Tests: Recent Labs  Lab 07/05/24 1521  ALBUMIN  2.6*   No results for input(s): LIPASE, AMYLASE in the last 168 hours. Recent Labs  Lab 07/04/24 1422  AMMONIA 15   Coagulation Profile: No results for input(s): INR, PROTIME in the last 168  hours.  Cardiac Enzymes: No results for input(s): CKTOTAL, CKMB, CKMBINDEX, TROPONINI in the last 168 hours. BNP (last 3 results) No results for input(s): PROBNP in the last 8760 hours. HbA1C: No results for input(s): HGBA1C in the last 72 hours. CBG: Recent Labs  Lab 07/01/24 1010 07/01/24 1542 07/01/24 1954 07/02/24 1203 07/02/24 1600  GLUCAP 103* 101* 87 99 90   Lipid Profile: No results for input(s): CHOL, HDL, LDLCALC, TRIG, CHOLHDL, LDLDIRECT in the last 72 hours. Thyroid Function Tests: No results for input(s): TSH, T4TOTAL, FREET4, T3FREE, THYROIDAB in the last 72 hours. Anemia Panel: No results for input(s): VITAMINB12, FOLATE, FERRITIN, TIBC, IRON , RETICCTPCT in the last 72 hours. Sepsis Labs: No results for input(s): PROCALCITON, LATICACIDVEN in the last 168 hours.  Recent Results (from the past 240 hours)  Resp panel by RT-PCR (RSV, Flu A&B, Covid) Anterior Nasal Swab     Status: None   Collection Time: 06/28/24  6:33 PM   Specimen: Anterior Nasal Swab  Result Value Ref Range Status   SARS Coronavirus 2 by RT PCR NEGATIVE NEGATIVE Final    Comment: (NOTE) SARS-CoV-2 target nucleic acids are NOT DETECTED.  The SARS-CoV-2 RNA is generally detectable in upper respiratory specimens during the acute phase of infection. The lowest concentration of SARS-CoV-2 viral copies this assay can detect is 138 copies/mL. A negative result does not preclude SARS-Cov-2 infection and should not be used as the sole basis for treatment or other patient management decisions. A negative result may occur with  improper specimen collection/handling, submission of specimen other than nasopharyngeal swab, presence of viral mutation(s) within the areas targeted by this assay, and inadequate number of viral copies(<138 copies/mL). A negative result must be combined with clinical observations, patient history, and  epidemiological information. The expected result is Negative.  Fact Sheet for Patients:  bloggercourse.com  Fact Sheet for Healthcare Providers:  seriousbroker.it  This test is no t yet approved or cleared by the [place]  FDA and  has been authorized  for detection and/or diagnosis of SARS-CoV-2 by FDA under an Emergency Use Authorization (EUA). This EUA will remain  in effect (meaning this test can be used) for the duration of the COVID-19 declaration under Section 564(b)(1) of the Act, 21 U.S.C.section 360bbb-3(b)(1), unless the authorization is terminated  or revoked sooner.       Influenza A by PCR NEGATIVE NEGATIVE Final   Influenza B by PCR NEGATIVE NEGATIVE Final    Comment: (NOTE) The Xpert Xpress SARS-CoV-2/FLU/RSV plus assay is intended as an aid in the diagnosis of influenza from Nasopharyngeal swab specimens and should not be used as a sole basis for treatment. Nasal washings and aspirates are unacceptable for Xpert Xpress SARS-CoV-2/FLU/RSV testing.  Fact Sheet for Patients: bloggercourse.com  Fact Sheet for Healthcare Providers: seriousbroker.it  This test is not yet approved or cleared by the [place]  FDA and has been authorized for detection and/or diagnosis of SARS-CoV-2 by FDA under an Emergency Use Authorization (EUA). This EUA will remain in effect (meaning this test can be used) for the duration of the COVID-19 declaration under Section 564(b)(1) of the Act, 21 U.S.C. section 360bbb-3(b)(1), unless the authorization is terminated or revoked.     Resp Syncytial Virus by PCR NEGATIVE NEGATIVE Final    Comment: (NOTE) Fact Sheet for Patients: bloggercourse.com  Fact Sheet for Healthcare Providers: seriousbroker.it  This test is not yet approved or cleared by the [place]  FDA and has been  authorized for detection and/or diagnosis of SARS-CoV-2 by FDA under an Emergency Use Authorization (EUA). This EUA will remain in effect (meaning this test can be used) for the duration of the COVID-19 declaration under Section 564(b)(1) of the Act, 21 U.S.C. section 360bbb-3(b)(1), unless the authorization is terminated or revoked.  Performed at [facility], [address], [place], [name] [address]   Urine Culture     Status: Abnormal   Collection Time: 06/28/24  9:33 PM   Specimen: Urine, Random  Result Value Ref Range Status   Specimen Description   Final    URINE, RANDOM Performed at [facility], [address], [place], [name] [address]    Special Requests   Final    NONE Reflexed from [id number] Performed at [facility], [address], [place], [name] [address]    Culture >=100,000 COLONIES/mL ESCHERICHIA COLI (A)  Final   Report Status 06/30/2024 FINAL  Final   Organism ID, Bacteria ESCHERICHIA COLI (A)  Final      Susceptibility   Escherichia coli - MIC*    AMPICILLIN >=32 RESISTANT Resistant     CEFAZOLIN  (URINE) Value in next row Sensitive      8 SENSITIVEThis is a modified FDA-approved test that has been validated and its performance characteristics determined by the reporting laboratory.  This laboratory is certified under the Clinical Laboratory Improvement Amendments CLIA as qualified to perform high complexity clinical laboratory testing.    CEFEPIME  Value in next row Sensitive      8 SENSITIVEThis is a modified FDA-approved test that has been validated and its performance characteristics determined by the reporting laboratory.  This laboratory is certified under the Clinical Laboratory Improvement Amendments CLIA as qualified to perform high complexity clinical laboratory testing.    ERTAPENEM Value in next row Sensitive      8 SENSITIVEThis is a modified FDA-approved test that has been validated and its  performance characteristics determined by the reporting laboratory.  This laboratory is certified under the Clinical Laboratory  Improvement Amendments CLIA as qualified to perform high complexity clinical laboratory testing.    CEFTRIAXONE  Value in next row Sensitive      8 SENSITIVEThis is a modified FDA-approved test that has been validated and its performance characteristics determined by the reporting laboratory.  This laboratory is certified under the Clinical Laboratory Improvement Amendments CLIA as qualified to perform high complexity clinical laboratory testing.    CIPROFLOXACIN Value in next row Resistant      8 SENSITIVEThis is a modified FDA-approved test that has been validated and its performance characteristics determined by the reporting laboratory.  This laboratory is certified under the Clinical Laboratory Improvement Amendments CLIA as qualified to perform high complexity clinical laboratory testing.    GENTAMICIN Value in next row Sensitive      8 SENSITIVEThis is a modified FDA-approved test that has been validated and its performance characteristics determined by the reporting laboratory.  This laboratory is certified under the Clinical Laboratory Improvement Amendments CLIA as qualified to perform high complexity clinical laboratory testing.    NITROFURANTOIN Value in next row Sensitive      8 SENSITIVEThis is a modified FDA-approved test that has been validated and its performance characteristics determined by the reporting laboratory.  This laboratory is certified under the Clinical Laboratory Improvement Amendments CLIA as qualified to perform high complexity clinical laboratory testing.    TRIMETH /SULFA  Value in next row Sensitive      8 SENSITIVEThis is a modified FDA-approved test that has been validated and its performance characteristics determined by the reporting laboratory.  This laboratory is certified under the Clinical Laboratory Improvement Amendments CLIA as qualified  to perform high complexity clinical laboratory testing.    AMPICILLIN/SULBACTAM Value in next row Resistant      8 SENSITIVEThis is a modified FDA-approved test that has been validated and its performance characteristics determined by the reporting laboratory.  This laboratory is certified under the Clinical Laboratory Improvement Amendments CLIA as qualified to perform high complexity clinical laboratory testing.    PIP/TAZO Value in next row Sensitive      <=4 SENSITIVEThis is a modified FDA-approved test that has been validated and its performance characteristics determined by the reporting laboratory.  This laboratory is certified under the Clinical Laboratory Improvement Amendments CLIA as qualified to perform high complexity clinical laboratory testing.    MEROPENEM Value in next row Sensitive      <=4 SENSITIVEThis is a modified FDA-approved test that has been validated and its performance characteristics determined by the reporting laboratory.  This laboratory is certified under the Clinical Laboratory Improvement Amendments CLIA as qualified to perform high complexity clinical laboratory testing.    * >=100,000 COLONIES/mL ESCHERICHIA COLI  Surgical pcr screen     Status: Abnormal   Collection Time: 06/30/24  1:17 PM   Specimen: Nasal Mucosa; Nasal Swab  Result Value Ref Range Status   MRSA, PCR NEGATIVE NEGATIVE Final   Staphylococcus aureus POSITIVE (A) NEGATIVE Final    Comment: (NOTE) The Xpert SA Assay (FDA approved for NASAL specimens in patients 21 years of age and older), is one component of a comprehensive surveillance program. It is not intended to diagnose infection nor to guide or monitor treatment. Performed at [facility], [address], [place], [name] [address]   Aerobic/Anaerobic Culture w Gram Stain (surgical/deep wound)     Status: None   Collection Time: 06/30/24  1:50 PM   Specimen: Path fluid; Body Fluid  Result Value Ref Range Status  Specimen  Description FLUID  Final   Special Requests RIGHT FOOT GANGRENE  Final   Gram Stain   Final    FEW WBC PRESENT, PREDOMINANTLY PMN MODERATE GRAM POSITIVE COCCI FEW GRAM NEGATIVE RODS    Culture   Final    MODERATE ESCHERICHIA COLI FEW ENTEROCOCCUS FAECALIS NO ANAEROBES ISOLATED Performed at [facility], [address], [place], [name] [address]    Report Status 07/05/2024 FINAL  Final   Organism ID, Bacteria ESCHERICHIA COLI  Final   Organism ID, Bacteria ENTEROCOCCUS FAECALIS  Final      Susceptibility   Escherichia coli - MIC*    AMPICILLIN >=32 RESISTANT Resistant     CEFAZOLIN  (NON-URINE) 8 RESISTANT Resistant     CEFEPIME  <=0.12 SENSITIVE Sensitive     ERTAPENEM <=0.12 SENSITIVE Sensitive     CEFTRIAXONE  <=0.25 SENSITIVE Sensitive     CIPROFLOXACIN >=4 RESISTANT Resistant     GENTAMICIN <=1 SENSITIVE Sensitive     MEROPENEM <=0.25 SENSITIVE Sensitive     TRIMETH /SULFA  <=20 SENSITIVE Sensitive     AMPICILLIN/SULBACTAM >=32 RESISTANT Resistant     PIP/TAZO Value in next row Sensitive      <=4 SENSITIVEThis is a modified FDA-approved test that has been validated and its performance characteristics determined by the reporting laboratory.  This laboratory is certified under the Clinical Laboratory Improvement Amendments CLIA as qualified to perform high complexity clinical laboratory testing.    * MODERATE ESCHERICHIA COLI   Enterococcus faecalis - MIC*    AMPICILLIN Value in next row Sensitive      <=4 SENSITIVEThis is a modified FDA-approved test that has been validated and its performance characteristics determined by the reporting laboratory.  This laboratory is certified under the Clinical Laboratory Improvement Amendments CLIA as qualified to perform high complexity clinical laboratory testing.    VANCOMYCIN  Value in next row Sensitive      <=4 SENSITIVEThis is a modified FDA-approved test that has been validated and its performance characteristics determined by the  reporting laboratory.  This laboratory is certified under the Clinical Laboratory Improvement Amendments CLIA as qualified to perform high complexity clinical laboratory testing.    GENTAMICIN SYNERGY Value in next row Resistant      <=4 SENSITIVEThis is a modified FDA-approved test that has been validated and its performance characteristics determined by the reporting laboratory.  This laboratory is certified under the Clinical Laboratory Improvement Amendments CLIA as qualified to perform high complexity clinical laboratory testing.    * FEW ENTEROCOCCUS FAECALIS     Radiology Studies: CT HEAD WO CONTRAST ( ) Result Date: 07/04/2024 EXAM: CT HEAD WITHOUT CONTRAST 07/04/2024 08:30:11 PM TECHNIQUE: CT of the head was performed without the administration of intravenous contrast. Automated exposure control, iterative reconstruction, and/or weight based adjustment of the mA/kV was utilized to reduce the radiation dose to as low as reasonably achievable. COMPARISON: 12/23/2023 CLINICAL HISTORY: altered mental status FINDINGS: BRAIN AND VENTRICLES: No acute hemorrhage. No evidence of acute infarct. No hydrocephalus. No extra-axial collection. No mass effect or midline shift. Atherosclerotic calcifications within the cavernous internal carotid and vertebral arteries. ORBITS: Right retinal tamponade procedure. Unchanged left sub-retinal effusion. SINUSES: No acute abnormality. SOFT TISSUES AND SKULL: No acute soft tissue abnormality. No skull fracture. IMPRESSION: 1. No acute intracranial abnormality. Electronically signed by: Franky Stanford MD 07/04/2024 11:28 PM EST RP Workstation: [id number]      Unresulted Labs (From admission, onward)     Start     Ordered   07/07/24 0500  CBC  Tomorrow morning,   R       Question:  Specimen collection method  Answer:  Lab=Lab collect   07/06/24 1443   07/07/24 0500  Renal function panel  Tomorrow morning,   R       Question:  Specimen collection method  Answer:   Lab=Lab collect   07/06/24 1443             LOS:  LOS: 8 days   Time Spent: 45 minutes  Jayro Mcmath Al-Sultani, MD [facility]  If 7PM-7AM, please contact night-coverage  07/06/2024, 2:44 PM      "

## 2024-07-06 NOTE — TOC Progression Note (Signed)
 Transition of Care [facility]) - Progression Note    Patient Details  Name: Todd Mccoy MRN: [id number] Date of Birth: [birth year]  Transition of Care [facility]) CM/SW Contact  Todd Mccoy, [name] Phone Number: 07/06/2024, 4:36 PM  Clinical Narrative:     Per MD- patient wants to go home Vs SNF. However, patient's son believed it was best for patient to get some rehab before returning home. CSW called patient's son, left voice message to return call.  Todd Mccoy, MSW, LCSW Clinical Social Worker      Barriers to Discharge: Continued Medical Work up               Expected Discharge Plan and Services In-house Referral: Clinical Social Work     Living arrangements for the past 2 months: Single Family Home                                       Social Drivers of Health (SDOH) Interventions SDOH Screenings   Food Insecurity: Food Insecurity Present (06/30/2024)  Housing: Low Risk (06/30/2024)  Transportation Needs: No Transportation Needs (06/30/2024)  Utilities: Not At Risk (06/30/2024)  Alcohol  Screen: Low Risk (06/26/2024)  Depression (PHQ2-9): Low Risk (06/26/2024)  Financial Resource Strain: Low Risk (06/26/2024)  Physical Activity: Inactive (06/26/2024)  Social Connections: Socially Isolated (06/26/2024)  Stress: No Stress Concern Present (06/26/2024)  Tobacco Use: High Risk (07/01/2024)  Health Literacy: Inadequate Health Literacy (06/26/2024)    Readmission Risk Interventions     No data to display

## 2024-07-06 NOTE — Progress Notes (Addendum)
 [facility] NEPHROLOGY PROGRESS NOTE  Assessment/ Plan: Pt is a 53 y.o. yo male  with past medical history significant for hypertension, hyperlipidemia, diabetes, CHF, was admitted for right foot diabetic ulcer, osteomyelitis seen as a consultation for the management of ESRD.   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  (BKA proximal to site of infection per charting)   # ESRD:  - HD per TTS schedule  - addendum - note that his EDW will need to decrease as he is s/p amputation    # HTN - hypotension post-operatively - has now resolved.  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  Per charting seems plan is for SNF     Subjective:   Last HD on 1/1 with 1.5 kg UF.  He missed aranesp  yesterday as patient not available when due.  He states that he would rather go home because he has equipment from his prior amputation.  I reminded him that he now had a second amputation which could change things considerably and recommended that he discuss with team and PT; asked him to consider their recommendations.     Review of systems:      Denies shortness of breath or chest pain  Denies n/v No dizziness or cramping    Objective Vital signs in last 24 hours: Vitals:   07/06/24 0000 07/06/24 0352 07/06/24 0408 07/06/24 0646  BP: (!) 151/81  (!) 140/81   Pulse:      Resp: (!) 24 (!) 22 20 (!) 21  Temp:   97.8 F (36.6 C)   TempSrc:   Oral   SpO2:      Weight:      Height:       Weight change:   Intake/Output Summary (Last 24 hours) at 07/06/2024 1124 Last data filed at 07/06/2024 0200 Gross per 24 hour   Intake 380 ml  Output 1515 ml  Net -1135 ml      Labs: RENAL PANEL Recent Labs  Lab 06/30/24 0619 06/30/24 0912 07/01/24 0613 07/02/24 0336 07/03/24 0503 07/04/24 0423 07/05/24 1521  NA 120*  --  133* 133* 133* 133* 134*  K >7.5*   < > 3.9 3.8 3.9 3.6 3.9  CL 96*  --  93* 94* 92* 93* 93*  CO2 22  --  23 23 24 26 26   GLUCOSE 83  --  100* 94 102* 127* 101*  BUN 31*  --  52* 36* 45* 28* 39*  CREATININE 5.43*  --  7.44* 5.18* 6.12* 4.20* 5.24*  CALCIUM  7.5*  --  9.3 8.8* 9.4 8.7* 8.9  MG 1.4*  --   --   --   --   --   --   PHOS 2.3*  --   --   --  4.4  --  4.4  ALBUMIN   --   --   --   --   --   --  2.6*   < > = values in this interval not displayed.    Liver Function Tests: Recent Labs  Lab 07/05/24 1521  ALBUMIN  2.6*   No results for input(s): LIPASE, AMYLASE in the last 168 hours. Recent Labs  Lab 07/04/24 1422  AMMONIA 15   CBC: Recent Labs    12/21/23 1040 12/23/23 1630 07/01/24 1104 07/02/24 0336 07/03/24 0503 07/04/24 0423 07/05/24 1521  HGB 10.7*   < > 7.5* 8.1* 8.7* 8.7* 8.9*  MCV 89.7   < > 93.5 89.7 90.9 89.1 89.7  VITAMINB12 508  --   --   --   --   --   --   FOLATE 10.4  --   --   --   --   --   --   FERRITIN 383.3*  --   --   --   --   --   --   TIBC 239.4*  --   --   --   --   --   --   IRON  48  --   --   --   --   --   --    < > = values in this interval not displayed.    Cardiac Enzymes: No results for input(s): CKTOTAL, CKMB, CKMBINDEX, TROPONINI in the last 168 hours. CBG: Recent Labs  Lab 07/01/24 1010 07/01/24 1542 07/01/24 1954 07/02/24 1203 07/02/24 1600  GLUCAP 103* 101* 87 99 90    Iron  Studies: No results for input(s): IRON , TIBC, TRANSFERRIN, FERRITIN in the last 72 hours. Studies/Results: CT HEAD WO CONTRAST ( ) Result Date: 07/04/2024 EXAM: CT HEAD WITHOUT CONTRAST 07/04/2024 08:30:11 PM TECHNIQUE: CT of the head was performed without the administration of intravenous contrast. Automated  exposure control, iterative reconstruction, and/or weight based adjustment of the mA/kV was utilized to reduce the radiation dose to as low as reasonably achievable. COMPARISON: 12/23/2023 CLINICAL HISTORY: altered mental status FINDINGS: BRAIN AND VENTRICLES: No acute hemorrhage. No evidence of acute infarct. No hydrocephalus. No extra-axial collection. No mass effect or midline shift. Atherosclerotic calcifications within the cavernous internal carotid and vertebral arteries. ORBITS: Right retinal tamponade procedure. Unchanged left sub-retinal effusion. SINUSES: No acute abnormality. SOFT TISSUES AND SKULL: No acute soft tissue abnormality. No skull fracture. IMPRESSION: 1. No acute intracranial abnormality. Electronically signed by: Franky Stanford MD 07/04/2024 11:28 PM EST RP Workstation: [id number]     Medications: Infusions:  anticoagulant sodium citrate      tranexamic acid       Scheduled Medications:  sodium chloride    Intravenous Once   calcitRIOL   0.25 mcg Oral BID   darbepoetin (ARANESP ) injection - DIALYSIS  100 mcg Subcutaneous Q Thu-1800   enoxaparin  (LOVENOX ) injection  30 mg Subcutaneous Q24H   feeding supplement (GLUCERNA SHAKE)  237 mL Oral BID BM   polyethylene glycol  17 g Oral Daily   senna  1 tablet Oral Daily    have reviewed scheduled and prn medications.  Physical Exam:      General adult male in bed in no acute distress HEENT normocephalic atraumatic Neck supple trachea midline Lungs clear to auscultation bilaterally normal work of breathing at rest on room air Heart S1S2 no rub Abdomen soft nontender nondistended; obese habitus Extremities bilateral BKA; right leg in wound vac; no pitting edema  Psych blunted affect; no agitation Neuro awake, alert and oriented x 3; known gross visual impairment  Access RUE AVF with bruit and thrill    Outpatient HD orders:  [facility] at  [facility], TTS, 4 hr, BFR 450, 2k 2 ca, EDW 85.6 kg, last OP HD on 12/24.RUE AVF.  Mircera  100 mcg  on 12/18   [name] 07/06/2024, 11:49 AM  LOS: 8 days

## 2024-07-07 ENCOUNTER — Other Ambulatory Visit (HOSPITAL_COMMUNITY): Payer: Self-pay

## 2024-07-07 DIAGNOSIS — G9341 Metabolic encephalopathy: Secondary | ICD-10-CM

## 2024-07-07 DIAGNOSIS — I9581 Postprocedural hypotension: Secondary | ICD-10-CM

## 2024-07-07 DIAGNOSIS — N186 End stage renal disease: Secondary | ICD-10-CM

## 2024-07-07 DIAGNOSIS — Z89511 Acquired absence of right leg below knee: Secondary | ICD-10-CM

## 2024-07-07 LAB — RENAL FUNCTION PANEL
Albumin: 2.9 g/dL — ABNORMAL LOW (ref 3.5–5.0)
Anion gap: 11 (ref 5–15)
BUN: 28 mg/dL — ABNORMAL HIGH (ref 6–20)
CO2: 29 mmol/L (ref 22–32)
Calcium: 8.8 mg/dL — ABNORMAL LOW (ref 8.9–10.3)
Chloride: 94 mmol/L — ABNORMAL LOW (ref 98–111)
Creatinine, Ser: 4.51 mg/dL — ABNORMAL HIGH (ref 0.61–1.24)
GFR, Estimated: 15 mL/min — ABNORMAL LOW
Glucose, Bld: 139 mg/dL — ABNORMAL HIGH (ref 70–99)
Phosphorus: 4.2 mg/dL (ref 2.5–4.6)
Potassium: 3.8 mmol/L (ref 3.5–5.1)
Sodium: 134 mmol/L — ABNORMAL LOW (ref 135–145)

## 2024-07-07 LAB — CBC
HCT: 27.2 % — ABNORMAL LOW (ref 39.0–52.0)
Hemoglobin: 8.4 g/dL — ABNORMAL LOW (ref 13.0–17.0)
MCH: 27.6 pg (ref 26.0–34.0)
MCHC: 30.9 g/dL (ref 30.0–36.0)
MCV: 89.5 fL (ref 80.0–100.0)
Platelets: 85 K/uL — ABNORMAL LOW (ref 150–400)
RBC: 3.04 MIL/uL — ABNORMAL LOW (ref 4.22–5.81)
RDW: 17 % — ABNORMAL HIGH (ref 11.5–15.5)
WBC: 12 K/uL — ABNORMAL HIGH (ref 4.0–10.5)
nRBC: 0 % (ref 0.0–0.2)

## 2024-07-07 MED ORDER — HEPARIN SODIUM (PORCINE) 1000 UNIT/ML DIALYSIS: 1000.0000 [IU] | INTRAMUSCULAR | Status: DC | PRN

## 2024-07-07 MED ORDER — LIDOCAINE HCL (PF) 1 % IJ SOLN: 5.0000 mL | INTRAMUSCULAR | Status: DC | PRN

## 2024-07-07 MED ORDER — SENNA 8.6 MG PO TABS
1.0000 | ORAL_TABLET | Freq: Every evening | ORAL | 0 refills | Status: AC | PRN
  Filled 2024-07-07: qty 14, 14d supply, fill #0

## 2024-07-07 MED ORDER — PENTAFLUOROPROP-TETRAFLUOROETH EX AERO: 1.0000 | INHALATION_SPRAY | CUTANEOUS | Status: DC | PRN

## 2024-07-07 MED ORDER — CARVEDILOL 12.5 MG PO TABS
25.0000 mg | ORAL_TABLET | Freq: Two times a day (BID) | ORAL | 0 refills | Status: DC
  Filled 2024-07-07: qty 60, 15d supply, fill #0

## 2024-07-07 MED ORDER — GABAPENTIN 300 MG PO CAPS
300.0000 mg | ORAL_CAPSULE | Freq: Every day | ORAL | 0 refills | Status: DC
  Filled 2024-07-07: qty 30, 30d supply, fill #0

## 2024-07-07 MED ORDER — LIDOCAINE-PRILOCAINE 2.5-2.5 % EX CREA: 1.0000 | TOPICAL_CREAM | CUTANEOUS | Status: DC | PRN

## 2024-07-07 MED ORDER — FUROSEMIDE 40 MG PO TABS
40.0000 mg | ORAL_TABLET | Freq: Every day | ORAL | 0 refills | Status: DC
  Filled 2024-07-07: qty 90, 90d supply, fill #0

## 2024-07-07 MED ORDER — GLUCERNA SHAKE PO LIQD: 237.0000 mL | Freq: Two times a day (BID) | ORAL | Status: AC

## 2024-07-07 MED ORDER — ANTICOAGULANT SODIUM CITRATE 4% (200MG/5ML) IV SOLN: 5.0000 mL | Status: DC | PRN

## 2024-07-07 MED ORDER — POLYETHYLENE GLYCOL 3350 17 GM/SCOOP PO POWD
17.0000 g | Freq: Every day | ORAL | 0 refills | Status: AC | PRN
  Filled 2024-07-07: qty 238, 14d supply, fill #0

## 2024-07-07 MED ORDER — ACETAMINOPHEN 500 MG PO TABS: 1000.0000 mg | ORAL_TABLET | Freq: Four times a day (QID) | ORAL | Status: AC | PRN

## 2024-07-07 NOTE — TOC Progression Note (Signed)
 Transition of Care [facility]) - Progression Note    Patient Details  Name: Todd Mccoy MRN: [id number] Date of Birth: [birth year]  [facility] CM/SW Contact  Hartley KATHEE Robertson, LCSWA Phone Number: 07/07/2024, 11:28 AM  Clinical Narrative:     Attempted to reach pt's son Jailyn to discuss disposition, no answer, vm left, will continue to follow.     Barriers to Discharge: Continued Medical Work up               Expected Discharge Plan and Services In-house Referral: Clinical Social Work     Living arrangements for the past 2 months: Single Family Home Expected Discharge Date: 07/07/24                                     Social Drivers of Health (SDOH) Interventions SDOH Screenings   Food Insecurity: Food Insecurity Present (06/30/2024)  Housing: Low Risk (06/30/2024)  Transportation Needs: No Transportation Needs (06/30/2024)  Utilities: Not At Risk (06/30/2024)  Alcohol  Screen: Low Risk (06/26/2024)  Depression (PHQ2-9): Low Risk (06/26/2024)  Financial Resource Strain: Low Risk (06/26/2024)  Physical Activity: Inactive (06/26/2024)  Social Connections: Socially Isolated (06/26/2024)  Stress: No Stress Concern Present (06/26/2024)  Tobacco Use: High Risk (07/01/2024)  Health Literacy: Inadequate Health Literacy (06/26/2024)    Readmission Risk Interventions     No data to display

## 2024-07-07 NOTE — TOC CM/SW Note (Signed)
 Referral received to arrange [facility] PT/OT/aide. Pt agrees with [facility] services. He reports that he doesn't have a preference for a HH agency. Discussed Medicare Compare list. He agreed to use [facility]. Contacted Corey at [facility] and he accepted the referral. Joane is not sure if they can arrange an aide and Dr. Mosie made aware. Pt to be DC today after HD. Completed paperwork for the ambulance. Nurse to call [facility] once pt is ready to be DC. Contacted pt's son and verified home address. He agrees with [facility] services through [facility]. Son made aware that [facility]  may not be able to arrange [facility] aide. He verbalized understanding.

## 2024-07-07 NOTE — Progress Notes (Signed)
 Orthopedic Surgery Progress Note   Assessment: Patient is a 54 y.o. male with right leg necrotizing soft tissue infection status post BKA   Plan: -Operative plans: complete -Diet: renal and carb modified -DVT ppx: per primary -Antibiotics: per primary -Weight bearing status: NWB RLE, WBAT LLE -PT/OT evaluate and treat -Pain control -Keep incisional vac in place until discharge, then switch to home prevena unit -Dispo: per primary  ___________________________________________________________________________  Subjective: No acute events overnight. Pain well controlled.    Physical Exam:  General: no acute distress, laying in bed Neurologic: alert, following commands, answering questions appropriately Respiratory: unlabored breathing  MSK:   -Right lower extremity  Wound vac in place with good suction and no evidence of leak - minimal bloody drainage in canister Fires knee extensors and flexors Residual limb warm and well perfused   Yesterday's total administered Morphine  Milligram Equivalents: 7.5   Patient name: Todd Mccoy Patient MRN: [id number] Date: 07/07/2024

## 2024-07-07 NOTE — Discharge Planning (Addendum)
 [facility] Patient Discharge Orders - [facility] CLINIC: [facility]  Patient's name: CREEK GAN Admit/DC Dates: 06/28/2024 - 07/07/2024  DISCHARGE DIAGNOSES: R foot osteomyelitis s/p R BKA 12/27  Post-op hypotension, resolved  HD ORDER CHANGES: Heparin  change: no EDW Change: YES New EDW: 79kg Bath Change: no  ANEMIA MANAGEMENT: Aranesp : Given: YES   Amount/Date of last dose: 100mcg on 1/2 ESA dose for discharge: mircera 150 mcg IV q 2 weeks, to start on 07/13/24 IV Iron  dose at discharge: per protocol Transfusion: Given: 1U PRBCs on 07/01/24  BONE/MINERAL MEDICATIONS: Hectorol/Calcitriol  change: no Sensipar/Parsabiv change: no  ACCESS INTERVENTION/CHANGE: no Details:   RECENT LABS: Recent Labs  Lab 07/07/24 0415  HGB 8.4*  NA 134*  K 3.8  CALCIUM  8.8*  PHOS 4.2  ALBUMIN  2.9*    IV ANTIBIOTICS: no Details:  OTHER ANTICOAGULATION: no Details:  OTHER/APPTS/LAB ORDERS: - Going home with HH, pls make sure he has all that he needs  D/C Meds to be reconciled by nurse after every discharge.  Completed By: Izetta Boehringer, PA-C [facility] Pager [phone]   Reviewed by: MD:______ RN_______

## 2024-07-07 NOTE — Progress Notes (Signed)
Pt transferred to HD 

## 2024-07-07 NOTE — TOC CM/SW Note (Signed)
 Pt is ready to be DC. Met with pt at bedside. Pt wants to go home with the support of his niece, daughter and cousin who live with him. Pt reports he has DME (Wheelchair, crutches, shower chair). He reports that he also has a ramp. Pt agreed for CM to contact his son to discuss the DC plan. Contacted son, Jaylin. He agreed with the DC plan and he reports that they can assist him as prn. He asked about a HH aide. Encouraged son to contact Medicaid to discuss [facility] aide services. Pt is scheduled for HD today. Per Mamata, RN, pt is waiting for HD and he may not be able to be DC tonight. Pt needs an ambulance for transportation. Will continue to f/u to assist with the DC plan.

## 2024-07-07 NOTE — Discharge Summary (Signed)
 " Physician Discharge Summary   Patient: Todd [facility] MRN: [id number] DOB: 22-Jul-1970  Admit date:     06/28/2024  Discharge date: 07/07/2024  Discharge Physician: Todd Mccoy   PCP: [name], [name], MD   Recommendations at discharge:   Follow up with PCP within 1-2 weeks of discharge. Repeat CBC and CMP. Continue management of HTN and resume meds as appropriate. Please review all medication changes as noted below. Follow up with orthopedic surgery as scheduled for post-op wound check  Discharge Diagnoses: Principal Problem:   Osteomyelitis (HCC) Active Problems:   Acquired absence of left leg below knee (HCC)   Type 2 diabetes mellitus with diabetic neuropathy, unspecified (HCC)   End stage renal disease (HCC)   Thrombocytopenia   Chronic heart failure with mildly reduced ejection fraction (HFmrEF, 41-49%) (HCC)   S/P BKA (below knee amputation), right (HCC)  Resolved Problems:   Acute metabolic encephalopathy   Necrotizing soft tissue infection   Postprocedural hypotension   Hospital Course: 54 year old PMHx of CHF, T2DM, HLD who presented to [facility] ED due to nausea and vomiting over the last several days.  Was undergoing treatment of osteomyelitis of his right foot with podiatry.  Underwent CT abdomen which showed no evidence of obstruction but noted possible cystitis.  He was also noted to have right leg necrotizing soft tissue infection and was subsequently transferred [facility] and evaluation by podiatry. He underwent Chopart amputation of right foot on 12/27 by podiatry but then underwent right BKA on 12/28 by orthopedic surgery. Antibiotics were discontinued on 12/31 given adequate source control after BKA. PT recommended SNF. However, the patient refused SNF and elected to go home. This was discussed extensively with him and his son, but he maintained that he wants to be discharged home, states has the support of his niece at home who can assist him with  medications and already has the necessary DME including a wheelchair, crutches, shower chair, and ramp. He was discharged following his scheduled HD session for that day.   Assessment and Plan:   # Right leg necrotizing soft tissue infection # Diabetic foot ulcer - MRI right foot showed destructive osteomyelitis of the distal fifth metatarsal and base of the fifth proximal phalanx with multiple lateral/plantar wounds tracking to bone, soft tissue gas and extensive edema/phlegmon concerning for a gas-forming soft tissue infection with possible early abscess, and diffuse intrinsic muscle T2 hyperintensity (denervation change versus myositis) - Status post Chopart amputation of right foot on 12/27 by podiatry which was ultimately revised to a right BKA on 12/28 by orthopedic surgery - Discussed with ortho, BKA was proximal to site of infection therefore source control is adequate.  - Afebrile, leukocytosis resolved - Discontinued IV antibiotics. No indication for continued antibiotics on discharge - Incisional wound vac in place until discharge. Was switched to home prevena unit - NWB RLE, WBAT LLE - Follow up with orthopedic surgery as scheduled by the orthopedic surgery team   # Hypotension - resolved - Likely post-operative hypotension - s/p IV albumin , 1 unit pRBCs, and midodrine  - SBP 140-150s - Midodrine  discontinued 1/1, last dose was 12/31  # History of hypertension - Home BP meds initially held given transient postprocedural hypotension - Now given improvement, resumed home Coreg  but at reduced dose of 12.5 mg BID given nephrology's recommendation to prioritze B-blockers. - Recommend follow up with PCP for monitoring and resumption of antihypertensives if BP continues to improve and is steadily elevated  # ESRD -  Nephrology following for routine HD - HD session today prior to discharge - Continue calcitriol  and sevelamer    # Acute metabolic encephalopathy - resolved - Ammonia  15 - CT head showed no acute intracranial abnormality  - Alert and oriented to person, place Todd Mccoy), year (2026), and situation.   # Anemia of chronic kidney disease - On aranesp  per nephrology - s/p 1 unit pRBC on 07/01/2024 - Hgb relatively stable - Continue PO iron  supplementation    # Chronic thrombocytopenia - Continue to monitor   # 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%, G2DD - Management of volume with HD - Resumed home Lasix  40 mg but daily instead of BID  #T2DM with diabetic neuropathy - Last Hgb A1c 6.8 (12/2023) - Continue home regimen on discharge - Gabapentin  decreased to 300 mg daily given ESRD       Consultants: Nephrology, podiatry, orthopedic surgery  Disposition: Home health Diet recommendation:  Diet Orders (From admission, onward)     Start     Ordered   07/01/24 1449  Diet renal/carb modified with fluid restriction Diet-HS Snack? Nothing; Fluid restriction: 1200 mL Fluid; Room service appropriate? Yes with Assist; Fluid consistency: Thin  Diet effective now       Question Answer Comment  Diet-HS Snack? Nothing   Fluid restriction: 1200 mL Fluid   Room service appropriate? Yes with Assist   Fluid consistency: Thin      07/01/24 1448            DISCHARGE MEDICATION: Allergies as of 07/07/2024   No Known Allergies      Medication List     STOP taking these medications    doxycycline  100 MG tablet Commonly known as: VIBRA -TABS   gentamicin ointment 0.1 % Commonly known as: GARAMYCIN       TAKE these medications    acetaminophen  500 MG tablet Commonly known as: TYLENOL  Take 2 tablets (1,000 mg total) by mouth every 6 (six) hours as needed for mild pain (pain score 1-3). What changed:  how much to take when to take this reasons to take this   calcitRIOL  0.25 MCG capsule Commonly known as: ROCALTROL  Take 1 capsule (0.25 mcg total) by mouth 2 (two) times daily.   carvedilol  12.5 MG tablet Commonly known as: COREG  Take 2 tablets (25 mg  total) by mouth 2 (two) times daily with a meal. What changed: medication strength   feeding supplement (GLUCERNA SHAKE) Liqd Take 237 mLs by mouth 2 (two) times daily between meals.   ferrous sulfate  325 (65 FE) MG EC tablet Take 1 tablet (325 mg total) by mouth daily with breakfast.   furosemide  40 MG tablet Commonly known as: LASIX  Take 1 tablet (40 mg total) by mouth daily. What changed: when to take this   gabapentin  300 MG capsule Commonly known as: NEURONTIN  Take 1 capsule (300 mg total) by mouth daily. What changed:  how much to take when to take this   Lantus  SoloStar 100 UNIT/ML Solostar Pen Generic drug: insulin  glargine Inject 5 Units into the skin at bedtime.   omeprazole 20 MG capsule Commonly known as: PRILOSEC Take 20 mg by mouth daily.   polyethylene glycol powder 17 GM/SCOOP powder Commonly known as: GLYCOLAX /MIRALAX  Dissolve 1 capful (17g) in 4-8 ounces of liquid and take by mouth daily   senna 8.6 MG Tabs tablet Commonly known as: SENOKOT Take 1 tablet (8.6 mg total) by mouth at bedtime as needed for mild constipation.   sevelamer 800 MG tablet  Commonly known as: RENAGEL Take 800 mg by mouth 3 (three) times daily with meals.               Discharge Care Instructions  (From admission, onward)           Start     Ordered   07/07/24 0000  Discharge wound care:       Comments: Negative pressure wound therapy  Do not change dressing      Question Answer Comment Amount of suction? 125 mm/Hg   Suction Type? Continuous     07/01/24 1448     06/30/24 0900    Wound care  Daily      Comments: Cleanse R 5th toe/foot wounds with Vashe, do not rinse.  Using a Q tip applicator apply Vashe moistened gauze into wound bed, cover with dry gauze/ABD pad.  Secure dressing with Kerlix roll gauze   07/07/24 1059             Discharge Exam: Filed Weights   07/03/24 0754 07/05/24 1428 07/05/24 1823  Weight: 90.1 kg 83.2 kg 81.7 kg   Blood  pressure (!) 142/83, pulse 73, temperature 97.9 F (36.6 C), temperature source Oral, resp. rate 17, height 5' 10 (1.778 m), weight 81.7 kg, SpO2 98%.   Gen: NAD, A&Ox4 HEENT: NCAT Neck: Supple CV: RRR, no murmurs Resp: normal WOB, CTAB anteriorly Abd: Soft, NTND, no guarding Ext: Bilateral BKA, RLE wound vac in place, RUE AVF Skin: Warm, dry, no rashes/lesions Neuro: impaired vision, no focal deficits noted Psych: calm, cooperative, fair insight  Condition at discharge: good  The results of significant diagnostics from this hospitalization (including imaging, microbiology, ancillary and laboratory) are listed below for reference.   Imaging Studies: CT HEAD WO CONTRAST ( ) Result Date: 07/04/2024 EXAM: CT HEAD WITHOUT CONTRAST 07/04/2024 08:30:11 PM TECHNIQUE: CT of the head was performed without the administration of intravenous contrast. Automated exposure control, iterative reconstruction, and/or weight based adjustment of the mA/kV was utilized to reduce the radiation dose to as low as reasonably achievable. COMPARISON: 12/23/2023 CLINICAL HISTORY: altered mental status FINDINGS: BRAIN AND VENTRICLES: No acute hemorrhage. No evidence of acute infarct. No hydrocephalus. No extra-axial collection. No mass effect or midline shift. Atherosclerotic calcifications within the cavernous internal carotid and vertebral arteries. ORBITS: Right retinal tamponade procedure. Unchanged left sub-retinal effusion. SINUSES: No acute abnormality. SOFT TISSUES AND SKULL: No acute soft tissue abnormality. No skull fracture. IMPRESSION: 1. No acute intracranial abnormality. Electronically signed by: Franky Stanford MD 07/04/2024 11:28 PM EST RP Workstation: [id number]   DG Tibia/Fibula Right Result Date: 06/30/2024 CLINICAL DATA:  Soft tissue infection. EXAM: RIGHT TIBIA AND FIBULA - 2 VIEW COMPARISON:  None Available. FINDINGS: Bones are markedly demineralized and heterogeneous in appearance. No evidence for  an acute fracture. No gross bony destruction. Soft tissue gas noted over the foot. IMPRESSION: 1. Soft tissue gas over the foot. No gross bony destruction. 2. Marked bony demineralization. Electronically Signed   By: Camellia Candle M.D.   On: 06/30/2024 12:58   DG Ankle Complete Right Result Date: 06/30/2024 CLINICAL DATA:  Diabetic foot ulcer. EXAM: RIGHT ANKLE - COMPLETE 3+ VIEW COMPARISON:  None Available. FINDINGS: Bones are markedly demineralized with a heterogeneous appearance. No acute fracture evident. No gross bony destruction in the ankle region. Soft tissue gas visible in the midfoot region. IMPRESSION: 1. Soft tissue gas in the midfoot region compatible with infection. Necrotizing fasciitis not excluded. 2. No gross bony destruction in the ankle region.  3. Marked bony demineralization with a heterogeneous appearance presumably secondary to disuse osteopenia. Marrow infection not excluded. Electronically Signed   By: Camellia Candle M.D.   On: 06/30/2024 12:56   MR FOOT RIGHT WO CONTRAST Result Date: 06/29/2024 CLINICAL DATA:  Soft tissue infection suspected, foot, xray done ulcer, osteomyelitis EXAM: MRI OF THE RIGHT FOREFOOT WITHOUT CONTRAST TECHNIQUE: Multiplanar, multisequence MR imaging of the right foot was performed. No intravenous contrast was administered. COMPARISON:  Right foot radiographs dated 06/18/2024. FINDINGS: Bones/Joint/Cartilage Redemonstrated destruction with corresponding marrow signal abnormality of the fifth metatarsal head and neck extending into the mid shaft and the base of the fifth proximal phalanx, compatible with osteomyelitis. No convincing marrow signal abnormality identified elsewhere to suggest acute osteomyelitis. Status post amputation of the second toe. Mild degenerative changes of the first MTP joint in the midfoot. No significant joint effusion. No acute fracture or dislocation. Ligaments Lisfranc ligament is intact. Soft tissue/Muscles and Tendons Soft tissue  wound at the proximal midfoot demonstrating suspected packing material. There is underlying extensive subcutaneous soft tissue edema extending along the plantar aspect of the midfoot with foci of susceptibility artifact compatible with soft tissue gas within the subcutaneous fat of the plantar foot extending through the plantar plate into the flexor digitorum brevis muscle (series 7, images 5-13). Extensive surrounding T2 hyperintense signal without discrete loculated collection (series 7, images 9-17). These findings are concerning for gas-forming soft tissue infection with organizing phlegmonous change versus early abscess formation. Additional wounds at the lateral midfoot and forefoot at the level of the mid fifth metatarsal shaft and base of the fifth toe with wound tracking to the level of the destroyed fifth metatarsal distal shaft/head and neck with overlying thin curvilinear fluid collection measuring up to 2.6 cm AP x 0.3 cm TR x 2.2 cm craniocaudal (series 9, images 15-19 and series 7, images 19-23). Diffuse cutaneous thickening and irregularity of the foot. Diffusely increased T2 signal of the intrinsic foot musculature may reflect chronic denervation changes and/or myositis. IMPRESSION: 1. Osteomyelitis with destruction of the distal half of the fifth metatarsal and base of the fifth proximal phalanx. 2. Soft tissue wound at the proximal midfoot demonstrating suspected packing material. There is underlying extensive subcutaneous soft tissue edema extending along the plantar aspect of the midfoot with foci of susceptibility artifact compatible with soft tissue gas within the subcutaneous fat of the plantar foot extending deep through the plantar plate into the flexor digitorum brevis muscle. Extensive surrounding heterogenous fluid signal without discrete loculated collection. These findings are concerning for gas-forming soft tissue infection with organizing phlegmonous change versus early abscess  formation. 3. Additional wounds at the lateral midfoot and forefoot at the level of the mid fifth metatarsal shaft and base of the fifth toe. Wound tracts to the level of the destroyed fifth metatarsal distal shaft/head and neck with overlying thin curvilinear fluid collection measuring up to 2.6 x 2.2 x 0.3 cm. 4. Diffusely increased T2 signal of the intrinsic foot musculature may reflect chronic denervation changes and/or myositis. Electronically Signed   By: Harrietta Sherry M.D.   On: 06/29/2024 11:58   CT ABDOMEN PELVIS WO CONTRAST Result Date: 06/28/2024 EXAM: CT ABDOMEN AND PELVIS WITHOUT CONTRAST 06/28/2024 06:28:43 PM TECHNIQUE: CT of the abdomen and pelvis was performed without the administration of intravenous contrast. Multiplanar reformatted images are provided for review. Automated exposure control, iterative reconstruction, and/or weight-based adjustment of the mA/kV was utilized to reduce the radiation dose to as low as reasonably achievable.  COMPARISON: 02/04/2023 CLINICAL HISTORY: Bowel obstruction suspected. Nausea, vomiting, diarrhea, abdominal pain FINDINGS: LOWER CHEST: Small - moderate right pleural effusion. Cardiomegaly. Interlobular septal thickening and ground glass opacities in the lower lobes suggestive of edema. LIVER: Marked hepatomegaly. Hepatic steatosis. GALLBLADDER AND BILE DUCTS: Sludge in the gallbladder. No evidence of acute cholecystitis. No biliary ductal dilatation. SPLEEN: The spleen is mildly enlarged measuring 14.5 cm in craniocaudal dimension. PANCREAS: No acute abnormality. ADRENAL GLANDS: No acute abnormality. KIDNEYS, URETERS AND BLADDER: No stones in the kidneys or ureters. No hydronephrosis. No perinephric or periureteral stranding. Diffuse bladder wall thickening of the nondistended bladder. Trace adjacent perivesical stranding. GI AND BOWEL: Stomach demonstrates no acute abnormality. There is no bowel obstruction. PERITONEUM AND RETROPERITONEUM: Small volume or  abdominal pelvic ascites. No free air. VASCULATURE: Aorta is normal in caliber. Aortic atherosclerotic calcification. LYMPH NODES: 1.2 cm right iliac chain lymph node on series 2 image 75 has increased from 02/04/2023 when it measured 1.0 cm and 1.5 cm right iliac chain lymph node on series 2 image 60 was not well visualized previously due to ascites. Additional shotty subcentimeter retroperitoneal and iliac chain lymph nodes are similar. Marked enlargement of a right inguinal lymph node on series 2 image 98 measuring 2.4 cm. This is similar to 02/04/2023. Given relative stability since 02/04/2023 these are favored reactive. REPRODUCTIVE ORGANS: No acute abnormality. BONES AND SOFT TISSUES: No acute osseous abnormality. Body wall edema. No focal soft tissue abnormality. IMPRESSION: 1. No evidence of bowel obstruction. 2. Bladder wall thickening and trace perivesical stranding. Correlate for cystitis. 3. Hepatosplenomegaly. Small volume abdominal and pelvic ascites. 4. Small - moderate effusion with pulmonary edema in the lower lungs. Cardiomegaly. Electronically signed by: Norman Gatlin MD 06/28/2024 06:46 PM EST RP Workstation: [id number]   DG Foot 2 Views Right Result Date: 06/27/2024 EXAM: 1 or 2 VIEW(S) XRAY OF THE RIGHT FOOT 06/18/2024 12:05:00 PM COMPARISON: 10/07/2023 CLINICAL HISTORY: right foot xray FINDINGS: BONES AND JOINTS: Prior amputation of the second digit. New bone destruction of the fifth metatarsal head and proximal phalanx of the fifth digit. SOFT TISSUES: Diffuse soft tissue swelling greatest  about the dorsal forefoot IMPRESSION: 1. Osteomyelitis of the 5th metatarsal head and 5th proximal phalanx. 2. Diffuse soft tissue swelling about the foot consistent with cellulitis. Electronically signed by: Norman Gatlin MD 06/27/2024 11:14 PM EST RP Workstation: [id number]    Microbiology: Results for orders placed or performed during the hospital encounter of 06/28/24  Resp panel by RT-PCR  (RSV, Flu A&B, Covid) Anterior Nasal Swab     Status: None   Collection Time: 06/28/24  6:33 PM   Specimen: Anterior Nasal Swab  Result Value Ref Range Status   SARS Coronavirus 2 by RT PCR NEGATIVE NEGATIVE Final    Comment: (NOTE) SARS-CoV-2 target nucleic acids are NOT DETECTED.  The SARS-CoV-2 RNA is generally detectable in upper respiratory specimens during the acute phase of infection. The lowest concentration of SARS-CoV-2 viral copies this assay can detect is 138 copies/mL. A negative result does not preclude SARS-Cov-2 infection and should not be used as the sole basis for treatment or other patient management decisions. A negative result may occur with  improper specimen collection/handling, submission of specimen other than nasopharyngeal swab, presence of viral mutation(s) within the areas targeted by this assay, and inadequate number of viral copies(<138 copies/mL). A negative result must be combined with clinical observations, patient history, and epidemiological information. The expected result is Negative.  Fact Sheet for Patients:  bloggercourse.com  Fact Sheet for Healthcare Providers:  seriousbroker.it  This test is no t yet approved or cleared by the [place]  FDA and  has been authorized for detection and/or diagnosis of SARS-CoV-2 by FDA under an Emergency Use Authorization (EUA). This EUA will remain  in effect (meaning this test can be used) for the duration of the COVID-19 declaration under Section 564(b)(1) of the Act, 21 U.S.C.section 360bbb-3(b)(1), unless the authorization is terminated  or revoked sooner.       Influenza A by PCR NEGATIVE NEGATIVE Final   Influenza B by PCR NEGATIVE NEGATIVE Final    Comment: (NOTE) The Xpert Xpress SARS-CoV-2/FLU/RSV plus assay is intended as an aid in the diagnosis of influenza from Nasopharyngeal swab specimens and should not be used as a sole basis for  treatment. Nasal washings and aspirates are unacceptable for Xpert Xpress SARS-CoV-2/FLU/RSV testing.  Fact Sheet for Patients: bloggercourse.com  Fact Sheet for Healthcare Providers: seriousbroker.it  This test is not yet approved or cleared by the [place]  FDA and has been authorized for detection and/or diagnosis of SARS-CoV-2 by FDA under an Emergency Use Authorization (EUA). This EUA will remain in effect (meaning this test can be used) for the duration of the COVID-19 declaration under Section 564(b)(1) of the Act, 21 U.S.C. section 360bbb-3(b)(1), unless the authorization is terminated or revoked.     Resp Syncytial Virus by PCR NEGATIVE NEGATIVE Final    Comment: (NOTE) Fact Sheet for Patients: bloggercourse.com  Fact Sheet for Healthcare Providers: seriousbroker.it  This test is not yet approved or cleared by the [place]  FDA and has been authorized for detection and/or diagnosis of SARS-CoV-2 by FDA under an Emergency Use Authorization (EUA). This EUA will remain in effect (meaning this test can be used) for the duration of the COVID-19 declaration under Section 564(b)(1) of the Act, 21 U.S.C. section 360bbb-3(b)(1), unless the authorization is terminated or revoked.  Performed at [facility], [address] [place], [name] [address]   Urine Culture     Status: Abnormal   Collection Time: 06/28/24  9:33 PM   Specimen: Urine, Random  Result Value Ref Range Status   Specimen Description   Final    URINE, RANDOM Performed at [facility], [address], [place], [name] [address]    Special Requests   Final    NONE Reflexed from [id number] Performed at [facility], [address], [place], [name] [address]    Culture >=100,000 COLONIES/mL ESCHERICHIA COLI (A)  Final   Report Status 06/30/2024  FINAL  Final   Organism ID, Bacteria ESCHERICHIA COLI (A)  Final      Susceptibility   Escherichia coli - MIC*    AMPICILLIN >=32 RESISTANT Resistant     CEFAZOLIN  (URINE) Value in next row Sensitive      8 SENSITIVEThis is a modified FDA-approved test that has been validated and its performance characteristics determined by the reporting laboratory.  This laboratory is certified under the Clinical Laboratory Improvement Amendments CLIA as qualified to perform high complexity clinical laboratory testing.    CEFEPIME  Value in next row Sensitive      8 SENSITIVEThis is a modified FDA-approved test that has been validated and its performance characteristics determined by the reporting laboratory.  This laboratory is certified under the Clinical Laboratory Improvement Amendments CLIA as qualified to perform high complexity clinical laboratory testing.    ERTAPENEM Value in next row Sensitive      8 SENSITIVEThis  is a modified FDA-approved test that has been validated and its performance characteristics determined by the reporting laboratory.  This laboratory is certified under the Clinical Laboratory Improvement Amendments CLIA as qualified to perform high complexity clinical laboratory testing.    CEFTRIAXONE  Value in next row Sensitive      8 SENSITIVEThis is a modified FDA-approved test that has been validated and its performance characteristics determined by the reporting laboratory.  This laboratory is certified under the Clinical Laboratory Improvement Amendments CLIA as qualified to perform high complexity clinical laboratory testing.    CIPROFLOXACIN Value in next row Resistant      8 SENSITIVEThis is a modified FDA-approved test that has been validated and its performance characteristics determined by the reporting laboratory.  This laboratory is certified under the Clinical Laboratory Improvement Amendments CLIA as qualified to perform high complexity clinical laboratory testing.    GENTAMICIN  Value in next row Sensitive      8 SENSITIVEThis is a modified FDA-approved test that has been validated and its performance characteristics determined by the reporting laboratory.  This laboratory is certified under the Clinical Laboratory Improvement Amendments CLIA as qualified to perform high complexity clinical laboratory testing.    NITROFURANTOIN Value in next row Sensitive      8 SENSITIVEThis is a modified FDA-approved test that has been validated and its performance characteristics determined by the reporting laboratory.  This laboratory is certified under the Clinical Laboratory Improvement Amendments CLIA as qualified to perform high complexity clinical laboratory testing.    TRIMETH /SULFA  Value in next row Sensitive      8 SENSITIVEThis is a modified FDA-approved test that has been validated and its performance characteristics determined by the reporting laboratory.  This laboratory is certified under the Clinical Laboratory Improvement Amendments CLIA as qualified to perform high complexity clinical laboratory testing.    AMPICILLIN/SULBACTAM Value in next row Resistant      8 SENSITIVEThis is a modified FDA-approved test that has been validated and its performance characteristics determined by the reporting laboratory.  This laboratory is certified under the Clinical Laboratory Improvement Amendments CLIA as qualified to perform high complexity clinical laboratory testing.    PIP/TAZO Value in next row Sensitive      <=4 SENSITIVEThis is a modified FDA-approved test that has been validated and its performance characteristics determined by the reporting laboratory.  This laboratory is certified under the Clinical Laboratory Improvement Amendments CLIA as qualified to perform high complexity clinical laboratory testing.    MEROPENEM Value in next row Sensitive      <=4 SENSITIVEThis is a modified FDA-approved test that has been validated and its performance characteristics determined by the  reporting laboratory.  This laboratory is certified under the Clinical Laboratory Improvement Amendments CLIA as qualified to perform high complexity clinical laboratory testing.    * >=100,000 COLONIES/mL ESCHERICHIA COLI  Surgical pcr screen     Status: Abnormal   Collection Time: 06/30/24  1:17 PM   Specimen: Nasal Mucosa; Nasal Swab  Result Value Ref Range Status   MRSA, PCR NEGATIVE NEGATIVE Final   Staphylococcus aureus POSITIVE (A) NEGATIVE Final    Comment: (NOTE) The Xpert SA Assay (FDA approved for NASAL specimens in patients 20 years of age and older), is one component of a comprehensive surveillance program. It is not intended to diagnose infection nor to guide or monitor treatment. Performed at [facility], [address], [place], [name] [address]   Aerobic/Anaerobic Culture w Gram Stain (surgical/deep wound)  Status: None   Collection Time: 06/30/24  1:50 PM   Specimen: Path fluid; Body Fluid  Result Value Ref Range Status   Specimen Description FLUID  Final   Special Requests RIGHT FOOT GANGRENE  Final   Gram Stain   Final    FEW WBC PRESENT, PREDOMINANTLY PMN MODERATE GRAM POSITIVE COCCI FEW GRAM NEGATIVE RODS    Culture   Final    MODERATE ESCHERICHIA COLI FEW ENTEROCOCCUS FAECALIS NO ANAEROBES ISOLATED Performed at [facility], [address] [place], [name] [address]    Report Status 07/05/2024 FINAL  Final   Organism ID, Bacteria ESCHERICHIA COLI  Final   Organism ID, Bacteria ENTEROCOCCUS FAECALIS  Final      Susceptibility   Escherichia coli - MIC*    AMPICILLIN >=32 RESISTANT Resistant     CEFAZOLIN  (NON-URINE) 8 RESISTANT Resistant     CEFEPIME  <=0.12 SENSITIVE Sensitive     ERTAPENEM <=0.12 SENSITIVE Sensitive     CEFTRIAXONE  <=0.25 SENSITIVE Sensitive     CIPROFLOXACIN >=4 RESISTANT Resistant     GENTAMICIN <=1 SENSITIVE Sensitive     MEROPENEM <=0.25 SENSITIVE Sensitive     TRIMETH /SULFA  <=20 SENSITIVE Sensitive      AMPICILLIN/SULBACTAM >=32 RESISTANT Resistant     PIP/TAZO Value in next row Sensitive      <=4 SENSITIVEThis is a modified FDA-approved test that has been validated and its performance characteristics determined by the reporting laboratory.  This laboratory is certified under the Clinical Laboratory Improvement Amendments CLIA as qualified to perform high complexity clinical laboratory testing.    * MODERATE ESCHERICHIA COLI   Enterococcus faecalis - MIC*    AMPICILLIN Value in next row Sensitive      <=4 SENSITIVEThis is a modified FDA-approved test that has been validated and its performance characteristics determined by the reporting laboratory.  This laboratory is certified under the Clinical Laboratory Improvement Amendments CLIA as qualified to perform high complexity clinical laboratory testing.    VANCOMYCIN  Value in next row Sensitive      <=4 SENSITIVEThis is a modified FDA-approved test that has been validated and its performance characteristics determined by the reporting laboratory.  This laboratory is certified under the Clinical Laboratory Improvement Amendments CLIA as qualified to perform high complexity clinical laboratory testing.    GENTAMICIN SYNERGY Value in next row Resistant      <=4 SENSITIVEThis is a modified FDA-approved test that has been validated and its performance characteristics determined by the reporting laboratory.  This laboratory is certified under the Clinical Laboratory Improvement Amendments CLIA as qualified to perform high complexity clinical laboratory testing.    * FEW ENTEROCOCCUS FAECALIS    Labs: CBC: Recent Labs  Lab 07/01/24 1104 07/02/24 0336 07/03/24 0503 07/04/24 0423 07/05/24 1521 07/06/24 1528 07/07/24 0415  WBC 16.6*   < > 12.7* 9.9 13.4* 13.8* 12.0*  NEUTROABS 14.4*  --   --   --   --   --   --   HGB 7.5*   < > 8.7* 8.7* 8.9* 9.0* 8.4*  HCT 24.3*   < > 27.9* 27.8* 28.7* 28.8* 27.2*  MCV 93.5   < > 90.9 89.1 89.7 89.7 89.5  PLT  82*   < > 88* 90* 89* 86* 85*   < > = values in this interval not displayed.   Basic Metabolic Panel: Recent Labs  Lab 07/02/24 0336 07/03/24 0503 07/04/24 0423 07/05/24 1521 07/07/24 0415  NA 133* 133* 133* 134* 134*  K  3.8 3.9 3.6 3.9 3.8  CL 94* 92* 93* 93* 94*  CO2 23 24 26 26 29   GLUCOSE 94 102* 127* 101* 139*  BUN 36* 45* 28* 39* 28*  CREATININE 5.18* 6.12* 4.20* 5.24* 4.51*  CALCIUM  8.8* 9.4 8.7* 8.9 8.8*  PHOS  --  4.4  --  4.4 4.2   Liver Function Tests: Recent Labs  Lab 07/05/24 1521 07/07/24 0415  ALBUMIN  2.6* 2.9*   CBG: Recent Labs  Lab 07/01/24 1010 07/01/24 1542 07/01/24 1954 07/02/24 1203 07/02/24 1600  GLUCAP 103* 101* 87 99 90    Discharge time spent: Time Coordinating Discharge: I spent a total of 35 minutes engaged in face-to-face discussion with the patient and/or caregivers regarding the patients care, assessment, plan, and discharge disposition. Over 50% of this time was dedicated to counseling the patient on the risks and benefits of treatment options and the discharge plan, as well as coordinating post-discharge care.   Signed: Nasean Zapf Al-Sultani, MD [facility] 07/07/2024         "

## 2024-07-07 NOTE — Progress Notes (Signed)
" °  [facility] Progress Note   Subjective:   Seen in room - feels ok. No CP/dyspnea. For HD later today. Dispo pending - likely SNF.  Objective Vitals:   07/06/24 2321 07/07/24 0404 07/07/24 0840 07/07/24 0900  BP: (!) 149/83 (!) 146/78 (!) 154/82   Pulse:      Resp: 20 12 16    Temp: 98.5 F (36.9 C) 98.3 F (36.8 C) 97.9 F (36.6 C)   TempSrc: Oral Oral Oral   SpO2: 98%   98%  Weight:      Height:       Physical Exam General: Well appearing, NAD. Room air Heart: RRR Lungs: CTAB Abdomen: soft Extremities: B BKA, R BKA is new with wound vac in place Dialysis Access: RUE AVF +t/b  Additional Objective Labs: Basic Metabolic Panel: Recent Labs  Lab 07/03/24 0503 07/04/24 0423 07/05/24 1521 07/07/24 0415  NA 133* 133* 134* 134*  K 3.9 3.6 3.9 3.8  CL 92* 93* 93* 94*  CO2 24 26 26 29   GLUCOSE 102* 127* 101* 139*  BUN 45* 28* 39* 28*  CREATININE 6.12* 4.20* 5.24* 4.51*  CALCIUM  9.4 8.7* 8.9 8.8*  PHOS 4.4  --  4.4 4.2   Liver Function Tests: Recent Labs  Lab 07/05/24 1521 07/07/24 0415  ALBUMIN  2.6* 2.9*   CBC: Recent Labs  Lab 07/01/24 1104 07/02/24 0336 07/03/24 0503 07/04/24 0423 07/05/24 1521 07/06/24 1528 07/07/24 0415  WBC 16.6*   < > 12.7* 9.9 13.4* 13.8* 12.0*  NEUTROABS 14.4*  --   --   --   --   --   --   HGB 7.5*   < > 8.7* 8.7* 8.9* 9.0* 8.4*  HCT 24.3*   < > 27.9* 27.8* 28.7* 28.8* 27.2*  MCV 93.5   < > 90.9 89.1 89.7 89.7 89.5  PLT 82*   < > 88* 90* 89* 86* 85*   < > = values in this interval not displayed.   Medications:  anticoagulant sodium citrate       calcitRIOL   0.25 mcg Oral BID   Chlorhexidine  Gluconate Cloth  6 each Topical Q0600   darbepoetin (ARANESP ) injection - DIALYSIS  100 mcg Subcutaneous Q Fri-1800   enoxaparin  (LOVENOX ) injection  30 mg Subcutaneous Q24H   feeding supplement (GLUCERNA SHAKE)  237 mL Oral BID BM   polyethylene glycol  17 g Oral Daily   senna  1 tablet Oral Daily    Dialysis  Orders Dialyzes at  [facility], TTS, 4 hr, BFR 450, 2k 2 ca, EDW 85.6 kg, last OP HD on 12/24.RUE AVF.  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 BP/volume: hypotensive post-op, better now. Mido on hold. Lowering EDW s/p amputation.  Anemia of ESRD: Continue Aranesp  100mcg q Friday this admit Metabolic Bone Disease: Ca/Phos ok - continue VDRA, not on binders Nutrition: Alb low, continue supps Dispo: to SNF per charting.   Izetta Boehringer, PA-C 07/07/2024, 10:15 AM  [facility]    "

## 2024-07-07 NOTE — Plan of Care (Signed)
°  Problem: Clinical Measurements: Goal: Will remain free from infection Outcome: Progressing   Problem: Nutrition: Goal: Adequate nutrition will be maintained Outcome: Progressing   Problem: Activity: Goal: Risk for activity intolerance will decrease Outcome: Progressing   Problem: Pain Managment: Goal: General experience of comfort will improve and/or be controlled Outcome: Progressing   Problem: Safety: Goal: Ability to remain free from injury will improve Outcome: Progressing

## 2024-07-08 ENCOUNTER — Other Ambulatory Visit (HOSPITAL_COMMUNITY): Payer: Self-pay

## 2024-07-08 ENCOUNTER — Other Ambulatory Visit: Payer: Self-pay | Admitting: Family Medicine

## 2024-07-08 DIAGNOSIS — I5023 Acute on chronic systolic (congestive) heart failure: Secondary | ICD-10-CM

## 2024-07-08 DIAGNOSIS — Z89512 Acquired absence of left leg below knee: Secondary | ICD-10-CM

## 2024-07-09 ENCOUNTER — Other Ambulatory Visit (HOSPITAL_COMMUNITY): Payer: Self-pay

## 2024-07-09 ENCOUNTER — Telehealth: Payer: Self-pay | Admitting: *Deleted

## 2024-07-09 ENCOUNTER — Telehealth: Payer: Self-pay | Admitting: Orthopedic Surgery

## 2024-07-09 NOTE — Transitions of Care (Post Inpatient/ED Visit) (Signed)
" ° °  07/09/2024  Name: Todd Mccoy MRN: [id number] DOB: [birth year]  Today's TOC FU Call Status: Today's TOC FU Call Status:: Unsuccessful Call (1st Attempt) Unsuccessful Call (1st Attempt) Date: 07/09/24  Attempted to reach the patient regarding the most recent Inpatient/ED visit.  Follow Up Plan: Additional outreach attempts will be made to reach the patient to complete the Transitions of Care (Post Inpatient/ED visit) call.   Andrea Dimes RN, [name]   [facility] RN Care Manager [phone]  "

## 2024-07-09 NOTE — Telephone Encounter (Signed)
 I called and advised Signe of Dr. Jeraline message

## 2024-07-09 NOTE — Telephone Encounter (Signed)
 Signe (PT) from [facility] health called with verbals, and concerns, verbal asking for recommend, RN Wound Nurse disease and & meds, and OT pending. Also needed weight baring status and lower left extremity. Pt report little no appetite  and pain levels 6 out of 10 left flank and right leg pains, and also has nausea. Pt missing multi meds and has chronic nerve pains with falling. Pt need gabapentin  to help with nerve pain and wasn't given after discharge of hospital. Signe secure number is [phone].

## 2024-07-09 NOTE — Progress Notes (Signed)
 Late Note Entry- Jul 09, 2024  Pt was d/c on Saturday. Contacted [facility] this morning to be advised of pt's d/c date and that pt should resume care tomorrow.   Randine Mungo Dialysis Navigator [phone]

## 2024-07-10 ENCOUNTER — Telehealth: Payer: Self-pay

## 2024-07-10 NOTE — Transitions of Care (Post Inpatient/ED Visit) (Signed)
" ° °  07/10/2024  Name: Todd Mccoy MRN: [id number] DOB: 05/03/1971  Today's TOC FU Call Status: Today's TOC FU Call Status:: Unsuccessful Call (2nd Attempt) Unsuccessful Call (2nd Attempt) Date: 07/10/24  Attempted to reach the patient regarding the most recent Inpatient/ED visit.  Left a HIPAA approved voicemail message to phone number provided in demographics per DPR.    Follow Up Plan: Additional outreach attempts will be made to reach the patient to complete the Transitions of Care (Post Inpatient/ED visit) call.   Richerd Fish, RN, [name], CCM [facility] Management Coordinator Direct Dial : [phone]        "

## 2024-07-11 ENCOUNTER — Telehealth: Payer: Self-pay | Admitting: *Deleted

## 2024-07-11 ENCOUNTER — Telehealth: Payer: Self-pay | Admitting: Orthopedic Surgery

## 2024-07-11 NOTE — Transitions of Care (Post Inpatient/ED Visit) (Signed)
" ° °  07/11/2024  Name: Todd Mccoy MRN: [id number] DOB: Oct 07, 1970  Today's TOC FU Call Status: Today's TOC FU Call Status:: Unsuccessful Call (3rd Attempt) Unsuccessful Call (3rd Attempt) Date: 07/11/24  Attempted to reach the patient regarding the most recent Inpatient/ED visit.  Mr. Nghiem was unable to take this telephone call and request a call back.  Follow Up Plan: Additional outreach attempts will be made to reach the patient to complete the Transitions of Care (Post Inpatient/ED visit) call.   Andrea Dimes RN, [name] [place]  [facility] RN Care Manager [phone]  "

## 2024-07-11 NOTE — Telephone Encounter (Signed)
 Angeol called. She is the nurse working in home with patient. She would like to know what dressing orders she should be doing? [phone]

## 2024-07-11 NOTE — Telephone Encounter (Signed)
 I called and advised that the orders are on his D/c paperwork. I read her the instructions.  She says she is going to send order over to be signed.

## 2024-07-12 ENCOUNTER — Telehealth: Payer: Self-pay

## 2024-07-12 DIAGNOSIS — I9581 Postprocedural hypotension: Secondary | ICD-10-CM

## 2024-07-12 DIAGNOSIS — I5022 Chronic systolic (congestive) heart failure: Secondary | ICD-10-CM

## 2024-07-12 DIAGNOSIS — D696 Thrombocytopenia, unspecified: Secondary | ICD-10-CM

## 2024-07-12 DIAGNOSIS — Z89511 Acquired absence of right leg below knee: Secondary | ICD-10-CM

## 2024-07-12 NOTE — Transitions of Care (Post Inpatient/ED Visit) (Signed)
" ° °  07/12/2024  Name: Todd Mccoy MRN: [id number] DOB: February 07, 1971  Today's TOC FU Call Status: Today's TOC FU Call Status:: Successful TOC FU Call Completed TOC FU Call Complete Date: 07/12/24  Patient's Name and Date of Birth confirmed. Name, DOB  Transition Care Management Follow-up Telephone Call Date of Discharge: 07/07/24 Discharge Facility: [facility]) Type of Discharge: Inpatient Admission Primary Inpatient Discharge Diagnosis:: Osteomyelitis How have you been since you were released from the hospital?: Better Any questions or concerns?: No  Items Reviewed: Did you receive and understand the discharge instructions provided?: Yes Medications obtained,verified, and reconciled?: Yes (Medications Reviewed) Any new allergies since your discharge?: No Dietary orders reviewed?: Yes Type of Diet Ordered:: heart healthy low salt diet Do you have support at home?: Yes People in Home [RPT]: child(ren), adult  Medications Reviewed Today: verbally states that he has all his medications.  Medications Reviewed Today   Medications were not reviewed in this encounter     Home Care and Equipment/Supplies: Were Home Health Services Ordered?: No ([facility] nurse 2 times per week. / denies home health) Any new equipment or medical supplies ordered?: No     Placed call to patient and reviewed reason for call.  Patient reports that he is doing better and has good support from his family.  States that he needs a wheel chair and BCS.  Reviewed TOC notes in patient and patient stated that he had a wheelchair. States wheelchair was given to him and he needs a new one.  Reports [facility] Nurse comes twice a week -  reviewed with patient that he has [employer].  States he does not know where the nurse comes from.   Reviewed with patient that he needs to talk to PCP or orthopedist about orders for wheelchair and BSC.  Patient reports that he is at dialysis and does not want to continue call.  Offered  to call back at a later time and patient declines. Encouraged patient to speak with PCP and ortho about his needs and to make follow up appointments. Offered to provide phone numbers for his providers and he declined.   TOC and assessments not completed per patient decline.  Alan Ee, RN, [name], CEN Population Health- Transition of Care Team.  [facility] [phone]     "

## 2024-07-13 ENCOUNTER — Telehealth: Payer: Self-pay | Admitting: Orthopedic Surgery

## 2024-07-13 ENCOUNTER — Telehealth: Payer: Self-pay | Admitting: Family Medicine

## 2024-07-13 NOTE — Telephone Encounter (Signed)
No number is listed.

## 2024-07-13 NOTE — Telephone Encounter (Signed)
 Copied from CRM #[id number]. Topic: Clinical - Medication Question >> Jul 13, 2024 12:23 PM   Travis F wrote:  Reason for CRM: Eulalio, a nurse with [facility] is calling in requesting a nurse to give her a call regarding patient's medications. She says there are two medications in his chart that he doesn't have at the house and she wanted to know if the doctor still wanted patient to take them. Please follow up with Angelina.

## 2024-07-13 NOTE — Telephone Encounter (Signed)
 Signe from [facility] called saying that  patient needs a new wheelchair because the brakes are broken and their is no cushion on the seat and he doesn't have the appropriate leg rests. Call back number is [phone].

## 2024-07-16 NOTE — Telephone Encounter (Signed)
 Mailed rx to patient.

## 2024-07-17 ENCOUNTER — Telehealth: Payer: Self-pay

## 2024-07-17 NOTE — Telephone Encounter (Signed)
Noted patient has upcoming appointment to discuss.

## 2024-07-17 NOTE — Telephone Encounter (Signed)
 Copied from CRM [id number]. Topic: Clinical - Home Health Verbal Orders >> Jul 17, 2024 12:29 PM Richerd B wrote: Caller/Agency: Ms Delores, patients aide Callback Number: [phone] Service Requested: PCS needs more hours for home health with PCA Frequency: needs more hours for help at home, just started dialysis 2 weeks ago Any new concerns about the patient? no

## 2024-07-19 ENCOUNTER — Encounter: Admitting: Orthopedic Surgery

## 2024-07-20 ENCOUNTER — Telehealth: Payer: Self-pay | Admitting: Family Medicine

## 2024-07-20 ENCOUNTER — Ambulatory Visit (INDEPENDENT_AMBULATORY_CARE_PROVIDER_SITE_OTHER): Admitting: Family

## 2024-07-20 ENCOUNTER — Encounter: Payer: Self-pay | Admitting: Family

## 2024-07-20 DIAGNOSIS — Z89511 Acquired absence of right leg below knee: Secondary | ICD-10-CM

## 2024-07-20 NOTE — Progress Notes (Signed)
 "  Post-Op Visit Note   Patient: Todd Mccoy           Date of Birth: 08-30-1970           MRN: [id number] Visit Date: 07/20/2024 PCP: Delbert Clam, MD  Chief Complaint:  Chief Complaint  Patient presents with   Right Leg - Routine Post Op    07/01/24 Right BKA    HPI:  HPI The patient is a 54 year old gentleman status post right below-knee amputation in December 18,2025.  Patient is a new right transtibial  amputee.  Patient's current comorbidities are not expected to impact the ability to function with the prescribed prosthesis. Patient verbally communicates a strong desire to use a prosthesis. Patient currently requires mobility aids to ambulate without a prosthesis.  Expects not to use mobility aids with a new prosthesis.  Patient is a K2 level ambulator that will use a prosthesis to walk around their home and the community over low level environmental barriers.     Ortho Exam On examination right residual limb this is healing well moderate edema present there is no erythema warmth or weeping  Visit Diagnoses: No diagnosis found.  Plan: Sutures harvested today proceed with prosthesis set up follow-up in the office in 2 weeks  Follow-Up Instructions: No follow-ups on file.   Imaging: No results found.  Orders:  No orders of the defined types were placed in this encounter.  No orders of the defined types were placed in this encounter.    PMFS History: Patient Active Problem List   Diagnosis Date Noted   Thrombocytopenia 07/12/2024   Chronic heart failure with mildly reduced ejection fraction (HFmrEF, 41-49%) (HCC) 07/12/2024   S/P BKA (below knee amputation), right (HCC) 07/12/2024   Osteomyelitis (HCC) 06/28/2024   Elevated alkaline phosphatase level 12/21/2023   Elevated LFTs 12/21/2023   Skin ulcer of right great toe, limited to breakdown of skin (HCC) 10/07/2023   Diabetic polyneuropathy associated with diabetes mellitus due to underlying  condition (HCC) 10/05/2023   Other insomnia 10/05/2023   Bilateral carpal tunnel syndrome 10/05/2023   Cubital tunnel syndrome, bilateral 10/05/2023   Chronic pain syndrome 10/05/2023   Encounter for pain management 10/05/2023   Pulmonary HTN (HCC)    Secondary hyperparathyroidism of renal origin 02/24/2023   End stage renal disease (HCC) 02/19/2023   Acquired absence of left leg below knee (HCC) 02/14/2023   Acquired absence of other right toe(s) 02/14/2023   Dependence on renal dialysis 02/14/2023   Hyperlipidemia, unspecified 02/14/2023   Hypertensive heart and chronic kidney disease with heart failure and with stage 5 chronic kidney disease, or end stage renal disease (HCC) 02/14/2023   Nicotine dependence, cigarettes, uncomplicated 02/14/2023   Type 2 diabetes mellitus with diabetic neuropathy, unspecified (HCC) 02/14/2023   Type 2 diabetes mellitus with diabetic peripheral angiopathy without gangrene (HCC) 02/14/2023   Type 2 diabetes mellitus with unspecified diabetic retinopathy without macular edema (HCC) 02/14/2023   Diarrhea of presumed infectious origin 02/08/2023   Metabolic acidosis 02/08/2023   Anemia of chronic disease 02/08/2023   Acute renal failure superimposed on chronic kidney disease 02/08/2023   Shock circulatory (HCC) 02/05/2023   PAD (peripheral artery disease) 10/27/2022   Renal insufficiency    Acute on chronic systolic CHF (congestive heart failure) (HCC) 01/23/2022   Prolonged QT interval 01/23/2022   Chest pain 01/23/2022   Elevated troponin 01/23/2022   Anemia due to chronic kidney disease 01/23/2022   Hypertensive emergency 01/23/2022   Hyponatremia 01/23/2022  Chronic combined systolic (congestive) and diastolic (congestive) heart failure (HCC) 04/07/2021   Chronic renal disease, stage 4, severely decreased glomerular filtration rate between 15-29 mL/min/1.73 square meter (HCC)    Hypertensive urgency    Acute kidney injury superimposed on chronic  kidney disease    Acute CHF (congestive heart failure) (HCC) 02/27/2021   Normocytic anemia 02/27/2021   Anasarca 02/27/2021   Abscess of left foot 12/10/2020   S/P BKA (below knee amputation) unilateral, left (HCC) 12/10/2020   Dyslipidemia 12/10/2020   CKD (chronic kidney disease), stage III (HCC) 12/10/2020   HTN (hypertension) 11/12/2020   Cellulitis and abscess of foot    Diabetes mellitus with hyperglycemia (HCC) 09/07/2014   Tobacco abuse 09/07/2014   Abscess of back    Abscess of lower back 09/06/2014   DM2 (diabetes mellitus, type 2) (HCC) 09/06/2014   Sepsis (HCC) 09/06/2014   Scrotal abscess 06/20/2014   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/2020   pt states recent MD visit /left eye worse than right   Dyslipidemia 12/10/2020   Hx of BKA, left (HCC)    Hypertension    Morbid obesity (HCC)    Osteomyelitis (HCC)    Pneumonia    Pulmonary HTN (HCC)    mild to moderate (PASP36mmHg) by echo 07/2023    Family History  Problem Relation Age of Onset   Diabetes Mother    Diabetes Father    Diabetes Brother    Colon cancer Neg Hx    Esophageal cancer Neg Hx    Pancreatic cancer Neg Hx    Stomach cancer Neg Hx    Liver disease Neg Hx    CAD Neg Hx     Past Surgical History:  Procedure Laterality Date   ABSCESS DRAINAGE     neck   AMPUTATION Left 11/14/2020   Procedure: LEFT 5TH RAY AMPUTATION;  Surgeon: Harden Jerona GAILS, MD;  Location: [facility] OR;  Service: Orthopedics;  Laterality:  Left;   AMPUTATION Left 12/10/2020   Procedure: LEFT BELOW KNEE AMPUTATION;  Surgeon: Harden Jerona GAILS, MD;  Location: [facility] OR;  Service: Orthopedics;  Laterality: Left;   AMPUTATION Right 07/01/2024   Procedure: AMPUTATION BELOW KNEE;  Surgeon: Georgina Ozell LABOR, MD;  Location[facility];  Service: Orthopedics;  Laterality: Right;   AMPUTATION TOE Right 03/25/2020   Procedure: AMPUTATION TOE;  Surgeon: Gretel Ozell PARAS, DPM;  Location: [facility] ORS;  Service: Podiatry;  Laterality: Right;   AV FISTULA PLACEMENT Right 05/23/2023   Procedure: RIGHT ARM Brachiocephalic ARTERIOVENOUS (AV) FISTULA CREATION;  Surgeon: Lanis Fonda BRAVO, MD;  Location: [facility] OR;  Service: Vascular;  Laterality: Right;   INCISION AND DRAINAGE ABSCESS Right 09/07/2014   Procedure: INCISION AND DRAINAGE ABSCESS RIGHT FLANK;  Surgeon: Krystal Russell, MD;  Location: [facility] ORS;  Service: General;  Laterality: Right;   IR FLUORO GUIDE CV LINE RIGHT  02/11/2023   IR PARACENTESIS  10/03/2023   IR PARACENTESIS  10/19/2023   IR PARACENTESIS  11/07/2023   IR PARACENTESIS  11/30/2023   IR PARACENTESIS  12/23/2023   IR PARACENTESIS  02/03/2024   IR PARACENTESIS  04/16/2024   IR PARACENTESIS  05/14/2024   IR REMOVAL TUN CV CATH W/O FL  11/30/2023   IR US  GUIDE VASC ACCESS RIGHT  02/11/2023   LEG AMPUTATION BELOW KNEE Left 12/10/2020   SCROTUM EXPLORATION     TRANSMETATARSAL AMPUTATION Right 06/30/2024   Procedure: AMPUTATION, FOOT, TRANSMETATARSAL;  Surgeon: Janit Thresa HERO, DPM;  Location: [facility] OR;  Service: Orthopedics/Podiatry;  Laterality: Right;   Social History   Occupational History   Not on file  Tobacco Use   Smoking status: Every Day    Current packs/day: 0.00    Average packs/day: 0.5 packs/day for 32.0 years (16.0 ttl pk-yrs)    Types: Cigarettes    Start date: 08/24/1989    Last attempt to quit: 08/24/2021    Years since quitting: 2.9    Passive exposure: Past   Smokeless tobacco: Former   Tobacco comments:    Quit smoking previously February  2023  Vaping Use   Vaping status: Never Used  Substance and Sexual Activity   Alcohol  use: No   Drug use: No   Sexual activity: Not on file    "

## 2024-07-20 NOTE — Telephone Encounter (Signed)
 Pt confirmed appt

## 2024-07-23 ENCOUNTER — Telehealth: Payer: Self-pay | Admitting: Orthopedic Surgery

## 2024-07-23 ENCOUNTER — Encounter: Payer: Self-pay | Admitting: Family Medicine

## 2024-07-23 ENCOUNTER — Telehealth: Payer: Self-pay

## 2024-07-23 ENCOUNTER — Ambulatory Visit: Attending: Family Medicine | Admitting: Family Medicine

## 2024-07-23 VITALS — BP 130/78 | HR 79 | Temp 97.8°F | Ht 70.0 in | Wt 171.0 lb

## 2024-07-23 DIAGNOSIS — Z794 Long term (current) use of insulin: Secondary | ICD-10-CM | POA: Diagnosis not present

## 2024-07-23 DIAGNOSIS — G546 Phantom limb syndrome with pain: Secondary | ICD-10-CM | POA: Diagnosis not present

## 2024-07-23 DIAGNOSIS — I120 Hypertensive chronic kidney disease with stage 5 chronic kidney disease or end stage renal disease: Secondary | ICD-10-CM | POA: Diagnosis not present

## 2024-07-23 DIAGNOSIS — F17210 Nicotine dependence, cigarettes, uncomplicated: Secondary | ICD-10-CM

## 2024-07-23 DIAGNOSIS — Z89512 Acquired absence of left leg below knee: Secondary | ICD-10-CM | POA: Diagnosis not present

## 2024-07-23 DIAGNOSIS — Z89511 Acquired absence of right leg below knee: Secondary | ICD-10-CM

## 2024-07-23 DIAGNOSIS — Z992 Dependence on renal dialysis: Secondary | ICD-10-CM

## 2024-07-23 DIAGNOSIS — E119 Type 2 diabetes mellitus without complications: Secondary | ICD-10-CM | POA: Diagnosis not present

## 2024-07-23 DIAGNOSIS — N185 Chronic kidney disease, stage 5: Secondary | ICD-10-CM

## 2024-07-23 DIAGNOSIS — I1311 Hypertensive heart and chronic kidney disease without heart failure, with stage 5 chronic kidney disease, or end stage renal disease: Secondary | ICD-10-CM | POA: Insufficient documentation

## 2024-07-23 DIAGNOSIS — N186 End stage renal disease: Secondary | ICD-10-CM

## 2024-07-23 DIAGNOSIS — E1165 Type 2 diabetes mellitus with hyperglycemia: Secondary | ICD-10-CM

## 2024-07-23 DIAGNOSIS — E1122 Type 2 diabetes mellitus with diabetic chronic kidney disease: Secondary | ICD-10-CM

## 2024-07-23 LAB — POCT GLYCOSYLATED HEMOGLOBIN (HGB A1C): HbA1c, POC (controlled diabetic range): 6.1 % (ref 0.0–7.0)

## 2024-07-23 MED ORDER — CARVEDILOL 12.5 MG PO TABS: 25.0000 mg | ORAL_TABLET | Freq: Two times a day (BID) | ORAL | 1 refills | Status: AC

## 2024-07-23 MED ORDER — GABAPENTIN 300 MG PO CAPS: 300.0000 mg | ORAL_CAPSULE | Freq: Every day | ORAL | 1 refills | Status: AC

## 2024-07-23 MED ORDER — LANTUS SOLOSTAR 100 UNIT/ML ~~LOC~~ SOPN: 5.0000 [IU] | PEN_INJECTOR | Freq: Every day | SUBCUTANEOUS | 3 refills | Status: AC

## 2024-07-23 MED ORDER — MISC. DEVICES MISC: 0 refills | Status: AC

## 2024-07-23 MED ORDER — DULOXETINE HCL 60 MG PO CPEP: 60.0000 mg | ORAL_CAPSULE | Freq: Every day | ORAL | 1 refills | Status: AC

## 2024-07-23 MED ORDER — FUROSEMIDE 40 MG PO TABS: 40.0000 mg | ORAL_TABLET | Freq: Every day | ORAL | 1 refills | Status: AC

## 2024-07-23 NOTE — Telephone Encounter (Signed)
 Pt called stating Rocky was to send meds in for him Friday and never did send it. Please send to [facility]. Pt number is [phone].

## 2024-07-23 NOTE — Patient Instructions (Signed)
 VISIT SUMMARY:  During your visit, we discussed your post-surgical needs following your right tibia amputation, management of phantom limb pain, diabetes, and hypertension. We also reviewed your general health maintenance, including smoking cessation.  YOUR PLAN:  -ACQUIRED ABSENCE OF RIGHT LEG BELOW KNEE: This means that your right leg has been amputated below the knee. We have ordered a wheelchair, bedside commode, and bedpan to assist with your mobility and daily activities. Additionally, we have increased your personal care service hours to provide you with more support.  -PHANTOM LIMB PAIN: Phantom limb pain is pain that feels like it's coming from a body part that's no longer there. We have refilled your gabapentin  prescription for nighttime use and discussed the potential use of duloxetine  for daytime pain control if needed.  -TYPE 2 DIABETES MELLITUS: Type 2 diabetes is a condition that affects the way your body processes blood sugar. Your A1c level is 6.1, indicating good control. Continue taking Lantus  5 units at bedtime and monitor your blood sugars, reporting if they consistently fall below 80 mg/dL.  -HYPERTENSION ASSOCIATED WITH STAGE 4 CHRONIC KIDNEY DISEASE: Hypertension is high blood pressure, and it is being managed with your current medications. Continue your current antihypertensive regimen.  -GENERAL HEALTH MAINTENANCE: We discussed the importance of smoking cessation to prevent further health complications. You declined lung cancer screening at this time, but we will revisit this in future visits.  INSTRUCTIONS:  Please follow up with us  if you experience any issues with your new equipment or if your pain persists despite the new medications. Continue monitoring your blood sugar levels and report any significant changes. We will discuss lung cancer screening options in future visits.

## 2024-07-23 NOTE — Telephone Encounter (Signed)
 I met with the patient when he was in the clinic today. He was accompanied by his PCS aide and his niece, Deleta.   He currently only receives 28 hours/ month of PCS and is requesting additional hours due to his recent right BKA.  I explained that we can submit the request but there is no guarantee that they will accept it.  Even though he has experience a change in his medical status, they will usually only consider there request for an increase if the patient is experiencing cognitive changes.   We also discussed CAP services and his aide has the number for [employer] and can help him call and initiate that application process.  I also discussed compliance packaging for his medications.  I explained that [facility] can pre-package some if not most of the medications; but not necessarily all of them. He said he preferred to keep his prescriptions where they currently are filled.    I told them to please call me with any questions.

## 2024-07-23 NOTE — Progress Notes (Addendum)
 "  Subjective:  Patient ID: Todd Mccoy, male    DOB: [birth year]  Age: 54 y.o. MRN: [id number]  CC: Hospitalization Follow-up (Discuss personal care hours, medical bed, and wheelchair./ Medication refills  )     Discussed the use of AI scribe software for clinical note transcription with the patient, who gave verbal consent to proceed.  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  He underwent a right tibia amputation in December and is here for post-surgical follow-up.  He has followed up with his orthopedic.  He needs additional PCS hours and durable medical equipment including a wheelchair, bedside commode. His insurance is now [employer].  He has phantom pain in the amputated limb. He uses Tylenol  and has been unable to refill gabapentin . He is currently not on specific therapy for phantom pain.  He has diabetes. His last A1c is 6.1 today.  He takes Lantus  5 units at bedtime. His niece helps him check his blood sugars, which average 130 to 140 mg/dL.  He takes carvedilol  for blood pressure and furosemide  once daily. His blood pressure was slightly elevated today, which he attributes to taking his medication late. Hemodialysis sessions are going well.  He smokes about four packs of cigarettes a day and is not ready to proceed with lung cancer screening.    Past Medical History:  Diagnosis Date   Anemia    Anemia    Anxiety    Chronic combined systolic (congestive) and diastolic (congestive) heart failure (HCC)    45-50% with GLS -13%, G2DD, mild to moderate  PTHN   CKD (chronic kidney disease), stage IV (HCC) 12/10/2020   Diabetes mellitus with complication (HCC)    Diabetic neuropathy (HCC)    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/2020   pt states retinal eye damage- left worse than the right- recent visited MD    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/2020   pt states recent MD visit /left eye worse than right   Dyslipidemia 12/10/2020   Hx of BKA, left (HCC)    Hypertension    Morbid obesity (HCC)    Osteomyelitis (HCC)    Pneumonia    Pulmonary HTN (HCC)    mild to moderate (PASP52mmHg) by echo 07/2023    Past Surgical History:  Procedure Laterality Date   ABSCESS DRAINAGE     neck   AMPUTATION Left 11/14/2020   Procedure: LEFT 5TH RAY AMPUTATION;  Surgeon: Harden Jerona GAILS, MD;  Location: [facility] OR;  Service: Orthopedics;  Laterality: Left;   AMPUTATION Left 12/10/2020   Procedure: LEFT BELOW KNEE AMPUTATION;  Surgeon: Harden Jerona GAILS, MD;  Location: [facility] OR;  Service: Orthopedics;  Laterality: Left;   AMPUTATION Right 07/01/2024   Procedure: AMPUTATION BELOW KNEE;  Surgeon: Georgina Ozell LABOR, MD;  Location: [facility] OR;  Service: Orthopedics;  Laterality: Right;   AMPUTATION TOE Right 03/25/2020   Procedure: AMPUTATION TOE;  Surgeon: Gretel Ozell PARAS, DPM;  Location: [facility] ORS;  Service: Podiatry;  Laterality: Right;   AV FISTULA PLACEMENT Right 05/23/2023   Procedure: RIGHT ARM Brachiocephalic ARTERIOVENOUS (AV) FISTULA  CREATION;  Surgeon: Lanis Fonda BRAVO, MD;  Location: [facility] OR;  Service: Vascular;  Laterality: Right;   INCISION AND DRAINAGE ABSCESS Right 09/07/2014   Procedure: INCISION AND DRAINAGE ABSCESS RIGHT FLANK;  Surgeon: Krystal Russell, MD;  Location: [facility] ORS;  Service: General;  Laterality: Right;   IR FLUORO GUIDE CV LINE RIGHT  02/11/2023   IR PARACENTESIS  10/03/2023   IR PARACENTESIS  10/19/2023   IR PARACENTESIS  11/07/2023   IR PARACENTESIS  11/30/2023   IR PARACENTESIS  12/23/2023   IR PARACENTESIS  02/03/2024   IR  PARACENTESIS  04/16/2024   IR PARACENTESIS  05/14/2024   IR REMOVAL TUN CV CATH W/O FL  11/30/2023   IR US  GUIDE VASC ACCESS RIGHT  02/11/2023   LEG AMPUTATION BELOW KNEE Left 12/10/2020   SCROTUM EXPLORATION     TRANSMETATARSAL AMPUTATION Right 06/30/2024   Procedure: AMPUTATION, FOOT, TRANSMETATARSAL;  Surgeon: Janit Thresa HERO, DPM;  Location: [facility] OR;  Service: Orthopedics/Podiatry;  Laterality: Right;    Family History  Problem Relation Age of Onset   Diabetes Mother    Diabetes Father    Diabetes Brother    Colon cancer Neg Hx    Esophageal cancer Neg Hx    Pancreatic cancer Neg Hx    Stomach cancer Neg Hx    Liver disease Neg Hx    CAD Neg Hx     Social History   Socioeconomic History   Marital status: Single    Spouse name: Not on file   Number of children: 1   Years of education: Not on file   Highest education level: Not on file  Occupational History   Not on file  Tobacco Use   Smoking status: Every Day    Current packs/day: 0.00    Average packs/day: 0.5 packs/day for 32.0 years (16.0 ttl pk-yrs)    Types: Cigarettes    Start date: 08/24/1989    Last attempt to quit: 08/24/2021    Years since quitting: 2.9    Passive exposure: Past   Smokeless tobacco: Former   Tobacco comments:    Quit smoking previously February 2023  Vaping Use   Vaping status: Never Used  Substance and Sexual Activity   Alcohol  use: No   Drug use: No   Sexual activity: Not on file  Other Topics Concern   Not on file  Social History Narrative   Not on file   Social Drivers of Health   Tobacco Use: High Risk (07/20/2024)   Patient History    Smoking Tobacco Use: Every Day    Smokeless Tobacco Use: Former    Passive Exposure: Past  Physicist, Medical Strain: Low Risk (06/26/2024)   Overall Financial Resource Strain (CARDIA)    Difficulty of Paying Living Expenses: Not hard at all  Food Insecurity: Food Insecurity Present (06/30/2024)   Epic    Worried About Programme Researcher, Broadcasting/film/video in  the Last Year: Sometimes true    Ran Out of Food in the Last Year: Never true  Transportation Needs: No Transportation Needs (06/30/2024)   Epic    Lack of Transportation (Medical): No    Lack of Transportation (Non-Medical): No  Physical Activity: Inactive (06/26/2024)   Exercise Vital Sign    Days of Exercise per Week: 0 days    Minutes of Exercise per Session: 0 min  Stress: No Stress Concern Present (06/26/2024)   [employer] of Occupational Health - Occupational Stress Questionnaire    Feeling of Stress: Not  at all  Social Connections: Socially Isolated (06/26/2024)   Social Connection and Isolation Panel    Frequency of Communication with Friends and Family: More than three times a week    Frequency of Social Gatherings with Friends and Family: Once a week    Attends Religious Services: Never    Database Administrator or Organizations: No    Attends Banker Meetings: Never    Marital Status: Never married  Depression (PHQ2-9): Low Risk (06/26/2024)   Depression (PHQ2-9)    PHQ-2 Score: 0  Alcohol  Screen: Low Risk (06/26/2024)   Alcohol  Screen    Last Alcohol  Screening Score (AUDIT): 0  Housing: Low Risk (06/30/2024)   Epic    Unable to Pay for Housing in the Last Year: No    Number of Times Moved in the Last Year: 0    Homeless in the Last Year: No  Utilities: Not At Risk (06/30/2024)   Epic    Threatened with loss of utilities: No  Health Literacy: Inadequate Health Literacy (06/26/2024)   B1300 Health Literacy    Frequency of need for help with medical instructions: Often    Allergies[1]  Outpatient Medications Prior to Visit  Medication Sig Dispense Refill   acetaminophen  (TYLENOL ) 500 MG tablet Take 2 tablets (1,000 mg total) by mouth every 6 (six) hours as needed for mild pain (pain score 1-3).     calcitRIOL  (ROCALTROL ) 0.25 MCG capsule Take 1 capsule (0.25 mcg total) by mouth 2 (two) times daily. 180 capsule 3   feeding supplement,  GLUCERNA SHAKE, (GLUCERNA SHAKE) LIQD Take 237 mLs by mouth 2 (two) times daily between meals.     ferrous sulfate  325 (65 FE) MG EC tablet Take 1 tablet (325 mg total) by mouth daily with breakfast. 30 tablet 3   omeprazole (PRILOSEC) 20 MG capsule Take 20 mg by mouth daily.     polyethylene glycol powder (GLYCOLAX /MIRALAX ) 17 GM/SCOOP powder Dissolve 1 capful (17g) in 4-8 ounces of liquid and take by mouth daily 238 g 0   senna (SENOKOT) 8.6 MG TABS tablet Take 1 tablet (8.6 mg total) by mouth at bedtime as needed for mild constipation. 14 tablet 0   sevelamer (RENAGEL) 800 MG tablet Take 800 mg by mouth 3 (three) times daily with meals.     carvedilol  (COREG ) 12.5 MG tablet Take 2 tablets (25 mg total) by mouth 2 (two) times daily with a meal. 60 tablet 0   gabapentin  (NEURONTIN ) 300 MG capsule Take 1 capsule (300 mg total) by mouth daily. 30 capsule 0   insulin  glargine (LANTUS  SOLOSTAR) 100 UNIT/ML Solostar Pen Inject 5 Units into the skin at bedtime. 15 mL 3   furosemide  (LASIX ) 40 MG tablet Take 1 tablet (40 mg total) by mouth daily. (Patient not taking: Reported on 07/23/2024) 180 tablet 0   No facility-administered medications prior to visit.     ROS Review of Systems  Constitutional:  Negative for activity change and appetite change.  HENT:  Negative for sinus pressure and sore throat.   Eyes:  Positive for visual disturbance.  Respiratory:  Negative for chest tightness, shortness of breath and wheezing.   Cardiovascular:  Negative for chest pain and palpitations.  Gastrointestinal:  Negative for abdominal distention, abdominal pain and constipation.  Genitourinary: Negative.   Musculoskeletal:        See HPI  Psychiatric/Behavioral:  Negative for behavioral problems and dysphoric mood.     Objective:  BP 130/78   Pulse  79   Temp 97.8 F (36.6 C) (Oral)   Ht 5' 10 (1.778 m)   Wt 171 lb (77.6 kg)   SpO2 99%   BMI 24.54 kg/m      07/23/2024    2:44 PM 07/23/2024     2:13 PM 07/07/2024    9:25 PM  BP/Weight  Systolic BP 130 154 162  Diastolic BP 78 91 86  Wt. (Lbs)  171   BMI  24.54 kg/m2       Physical Exam Constitutional:      Appearance: He is well-developed.  Cardiovascular:     Rate and Rhythm: Normal rate.     Heart sounds: Normal heart sounds. No murmur heard. Pulmonary:     Effort: Pulmonary effort is normal.     Breath sounds: Normal breath sounds. No wheezing or rales.  Chest:     Chest wall: No tenderness.  Abdominal:     General: Bowel sounds are normal. There is no distension.     Palpations: Abdomen is soft. There is no mass.     Tenderness: There is no abdominal tenderness.  Musculoskeletal:     Comments: Bilateral BKA  Neurological:     Mental Status: He is alert and oriented to person, place, and time.  Psychiatric:        Mood and Affect: Mood normal.        Latest Ref Rng & Units 07/07/2024    4:15 AM 07/05/2024    3:21 PM 07/04/2024    4:23 AM  CMP  Glucose 70 - 99 mg/dL 860  898  872   BUN 6 - 20 mg/dL 28  39  28   Creatinine 0.61 - 1.24 mg/dL 5.48  4.75  5.79   Sodium 135 - 145 mmol/L 134  134  133   Potassium 3.5 - 5.1 mmol/L 3.8  3.9  3.6   Chloride 98 - 111 mmol/L 94  93  93   CO2 22 - 32 mmol/L 29  26  26    Calcium  8.9 - 10.3 mg/dL 8.8  8.9  8.7     Lipid Panel     Component Value Date/Time   CHOL 72 (L) 08/31/2023 1002   TRIG 44 08/31/2023 1002   HDL 35 (L) 08/31/2023 1002   CHOLHDL 2.1 08/31/2023 1002   LDLCALC 25 08/31/2023 1002   LDLDIRECT 36 05/21/2021 1057    CBC    Component Value Date/Time   WBC 12.0 (H) 07/07/2024 0415   RBC 3.04 (L) 07/07/2024 0415   HGB 8.4 (L) 07/07/2024 0415   HGB 8.4 (L) 04/17/2020 1716   HCT 27.2 (L) 07/07/2024 0415   HCT 26.3 (L) 04/17/2020 1716   PLT 85 (L) 07/07/2024 0415   PLT 197 04/17/2020 1716   MCV 89.5 07/07/2024 0415   MCV 85 04/17/2020 1716   MCH 27.6 07/07/2024 0415   MCHC 30.9 07/07/2024 0415   RDW 17.0 (H) 07/07/2024 0415   RDW 14.8  04/17/2020 1716   LYMPHSABS 1.0 07/01/2024 1104   LYMPHSABS 1.9 04/17/2020 1716   MONOABS 0.9 07/01/2024 1104   EOSABS 0.1 07/01/2024 1104   EOSABS 0.1 04/17/2020 1716   BASOSABS 0.0 07/01/2024 1104   BASOSABS 0.0 04/17/2020 1716    Lab Results  Component Value Date   HGBA1C 6.1 07/23/2024       Assessment & Plan Bilateral BKA Post-surgical status following right tibia amputation in December. Requires additional support for mobility and daily activities. - Ordered wheelchair,  bedside commode - Increased hours for personal care services; I have spoken with the case manager who will assist with this  Phantom limb pain Experiencing phantom limb pain post-amputation. Discussed potential use of duloxetine  as an additional option for pain control. No contraindications with hemodialysis. - Refilled gabapentin  300 mg for nighttime use. - Initiated duloxetine  for daytime use  Type 2 diabetes mellitus A1c is 6.1, indicating good glycemic control. Current regimen includes Lantus  5 units at bedtime. Blood sugars are monitored by niece, with average readings around 130-140 mg/dL. - Continue Lantus  5 units at bedtime. - Monitor blood sugars and report if consistently below 80 mg/dL.  Hypertension associated with stage 5 chronic kidney disease Blood pressure initially elevated but improved upon repeat.  It is well-controlled with current medication regimen. Recent elevated reading attributed to late medication intake. - Continue current antihypertensive regimen.   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 Declined lung cancer screening with CT scan at this time. - Advised smoking cessation. Smoking greater than 20-pack-year history-last low-dose chest CT  from 2024 recommended repeat hence I have ordered the CT scan. He is not ready to quit smoking       Meds ordered this encounter  Medications   Misc. Devices MISC    Sig: Wheelchair with Accessories: elevating leg rests (ELRs), wheel locks, extensions and anti-tippers. Cane or walker will not suffice. DX- bilateral BKA    Dispense:  1 each    Refill:  0   Misc. Devices MISC    Sig: Bed side commode. Diagnosis: bilateral  BKA    Dispense:  1 each    Refill:  0   carvedilol  (COREG ) 12.5 MG tablet    Sig: Take 2 tablets (25 mg total) by mouth 2 (two) times daily with a meal.    Dispense:  180 tablet    Refill:  1   gabapentin  (NEURONTIN ) 300 MG capsule    Sig: Take 1 capsule (300 mg total) by mouth daily.    Dispense:  90 capsule    Refill:  1   insulin  glargine (LANTUS  SOLOSTAR) 100 UNIT/ML Solostar Pen    Sig: Inject 5 Units into the skin at bedtime.    Dispense:  15 mL    Refill:  3   furosemide  (LASIX ) 40 MG tablet    Sig: Take 1 tablet (40 mg total) by mouth daily.    Dispense:  180 tablet    Refill:  1   DULoxetine  (CYMBALTA ) 60 MG capsule    Sig: Take 1 capsule (60 mg total) by mouth daily.    Dispense:  90 capsule    Refill:  1    Follow-up: Return in about 6 months (around 01/20/2025) for Chronic medical conditions.       Corrina Sabin, MD, FAAFP. [facility] [place], [name] [phone]   07/23/2024, 6:07 PM     [1] No Known Allergies  "

## 2024-07-24 ENCOUNTER — Other Ambulatory Visit: Payer: Self-pay | Admitting: Family

## 2024-07-24 ENCOUNTER — Telehealth: Payer: Self-pay | Admitting: Family

## 2024-07-24 NOTE — Telephone Encounter (Signed)
 Patient called back. Would like pain medication sent in for him.

## 2024-07-24 NOTE — Telephone Encounter (Signed)
 See most recent telephone note sent. He wants pain medication. Sent the request to [facility]. I will close this message.

## 2024-07-24 NOTE — Telephone Encounter (Signed)
 LM on VM to call back with clarification

## 2024-07-24 NOTE — Telephone Encounter (Signed)
 Signed PCS request for additional hours efaxed to [facility]: [phone]

## 2024-07-24 NOTE — Telephone Encounter (Signed)
 Called/VM full.

## 2024-07-25 ENCOUNTER — Ambulatory Visit (HOSPITAL_COMMUNITY)
Admission: RE | Admit: 2024-07-25 | Discharge: 2024-07-25 | Disposition: A | Discharge status: Home / Self Care | Source: Ambulatory Visit

## 2024-07-25 ENCOUNTER — Encounter (HOSPITAL_COMMUNITY): Payer: Self-pay

## 2024-07-25 DIAGNOSIS — I739 Peripheral vascular disease, unspecified: Secondary | ICD-10-CM

## 2024-07-25 MED ORDER — HYDROCODONE-ACETAMINOPHEN 5-325 MG PO TABS: 1.0000 | ORAL_TABLET | Freq: Three times a day (TID) | ORAL | 0 refills | Status: AC | PRN

## 2024-07-25 NOTE — Telephone Encounter (Signed)
 Sent to [facility]

## 2024-07-25 NOTE — Telephone Encounter (Signed)
 Pt informed

## 2024-07-27 ENCOUNTER — Telehealth: Payer: Self-pay | Admitting: Orthopedic Surgery

## 2024-07-27 ENCOUNTER — Ambulatory Visit: Payer: Self-pay | Admitting: *Deleted

## 2024-07-27 NOTE — Telephone Encounter (Signed)
 Signe from [facility] called wanting to let us  know that he is being discharged from pt because he refused this week and no showed last week and is requesting out patient therapy. Call back number is [phone].

## 2024-08-03 ENCOUNTER — Telehealth: Payer: Self-pay | Admitting: Family Medicine

## 2024-08-03 ENCOUNTER — Encounter: Admitting: Family

## 2024-08-03 NOTE — Telephone Encounter (Signed)
 Patients lisa (caretaker ) came to the office to check on the status of the wheelchair and bedside commode. She stated that they discussed this with the PCP during the patients visit and wanted to know when the patient will receive the equipment.

## 2024-08-06 ENCOUNTER — Ambulatory Visit: Payer: Self-pay | Admitting: Family Medicine

## 2024-08-31 ENCOUNTER — Ambulatory Visit: Admitting: Occupational Therapy

## 2025-01-21 ENCOUNTER — Ambulatory Visit: Payer: Self-pay | Admitting: Family Medicine
